# Patient Record
Sex: Female | Born: 1965 | Race: White | Hispanic: No | Smoking: Never smoker
Health system: Southern US, Community
[De-identification: ages and names within clinical notes are randomized; demographics above are authoritative.]

## PROBLEM LIST (undated history)

## (undated) DIAGNOSIS — F32A Depression, unspecified: Secondary | ICD-10-CM

## (undated) DIAGNOSIS — F419 Anxiety disorder, unspecified: Secondary | ICD-10-CM

## (undated) DIAGNOSIS — G2581 Restless legs syndrome: Secondary | ICD-10-CM

## (undated) DIAGNOSIS — K5900 Constipation, unspecified: Secondary | ICD-10-CM

## (undated) DIAGNOSIS — G4730 Sleep apnea, unspecified: Secondary | ICD-10-CM

## (undated) DIAGNOSIS — M1990 Unspecified osteoarthritis, unspecified site: Secondary | ICD-10-CM

## (undated) DIAGNOSIS — E119 Type 2 diabetes mellitus without complications: Secondary | ICD-10-CM

## (undated) DIAGNOSIS — R0602 Shortness of breath: Secondary | ICD-10-CM

## (undated) DIAGNOSIS — Z86718 Personal history of other venous thrombosis and embolism: Secondary | ICD-10-CM

## (undated) DIAGNOSIS — R7303 Prediabetes: Secondary | ICD-10-CM

## (undated) DIAGNOSIS — F429 Obsessive-compulsive disorder, unspecified: Secondary | ICD-10-CM

## (undated) DIAGNOSIS — G629 Polyneuropathy, unspecified: Secondary | ICD-10-CM

## (undated) DIAGNOSIS — T7840XA Allergy, unspecified, initial encounter: Secondary | ICD-10-CM

## (undated) DIAGNOSIS — R5382 Chronic fatigue, unspecified: Secondary | ICD-10-CM

## (undated) DIAGNOSIS — M65312 Trigger thumb, left thumb: Secondary | ICD-10-CM

## (undated) DIAGNOSIS — K0889 Other specified disorders of teeth and supporting structures: Secondary | ICD-10-CM

## (undated) DIAGNOSIS — R1310 Dysphagia, unspecified: Secondary | ICD-10-CM

## (undated) DIAGNOSIS — G9332 Myalgic encephalomyelitis/chronic fatigue syndrome: Secondary | ICD-10-CM

## (undated) DIAGNOSIS — R633 Feeding difficulties: Secondary | ICD-10-CM

## (undated) DIAGNOSIS — R519 Headache, unspecified: Secondary | ICD-10-CM

## (undated) DIAGNOSIS — J9690 Respiratory failure, unspecified, unspecified whether with hypoxia or hypercapnia: Secondary | ICD-10-CM

## (undated) DIAGNOSIS — E039 Hypothyroidism, unspecified: Secondary | ICD-10-CM

## (undated) DIAGNOSIS — R0600 Dyspnea, unspecified: Secondary | ICD-10-CM

## (undated) DIAGNOSIS — N189 Chronic kidney disease, unspecified: Secondary | ICD-10-CM

## (undated) DIAGNOSIS — K219 Gastro-esophageal reflux disease without esophagitis: Secondary | ICD-10-CM

## (undated) DIAGNOSIS — D689 Coagulation defect, unspecified: Secondary | ICD-10-CM

## (undated) DIAGNOSIS — M65311 Trigger thumb, right thumb: Secondary | ICD-10-CM

## (undated) DIAGNOSIS — Z5189 Encounter for other specified aftercare: Secondary | ICD-10-CM

## (undated) DIAGNOSIS — G709 Myoneural disorder, unspecified: Secondary | ICD-10-CM

## (undated) DIAGNOSIS — E7800 Pure hypercholesterolemia, unspecified: Secondary | ICD-10-CM

## (undated) DIAGNOSIS — F319 Bipolar disorder, unspecified: Secondary | ICD-10-CM

## (undated) DIAGNOSIS — M7989 Other specified soft tissue disorders: Secondary | ICD-10-CM

## (undated) DIAGNOSIS — F329 Major depressive disorder, single episode, unspecified: Secondary | ICD-10-CM

## (undated) DIAGNOSIS — M549 Dorsalgia, unspecified: Secondary | ICD-10-CM

## (undated) DIAGNOSIS — F988 Other specified behavioral and emotional disorders with onset usually occurring in childhood and adolescence: Secondary | ICD-10-CM

## (undated) DIAGNOSIS — M2550 Pain in unspecified joint: Secondary | ICD-10-CM

## (undated) DIAGNOSIS — I739 Peripheral vascular disease, unspecified: Secondary | ICD-10-CM

## (undated) DIAGNOSIS — I10 Essential (primary) hypertension: Secondary | ICD-10-CM

## (undated) HISTORY — PX: CHOLECYSTECTOMY: SHX55

## (undated) HISTORY — DX: Sleep apnea, unspecified: G47.30

## (undated) HISTORY — DX: Coagulation defect, unspecified: D68.9

## (undated) HISTORY — DX: Other specified behavioral and emotional disorders with onset usually occurring in childhood and adolescence: F98.8

## (undated) HISTORY — DX: Major depressive disorder, single episode, unspecified: F32.9

## (undated) HISTORY — DX: Obsessive-compulsive disorder, unspecified: F42.9

## (undated) HISTORY — DX: Bipolar disorder, unspecified: F31.9

## (undated) HISTORY — DX: Myalgic encephalomyelitis/chronic fatigue syndrome: G93.32

## (undated) HISTORY — DX: Essential (primary) hypertension: I10

## (undated) HISTORY — DX: Anxiety disorder, unspecified: F41.9

## (undated) HISTORY — DX: Myoneural disorder, unspecified: G70.9

## (undated) HISTORY — PX: SPINE SURGERY: SHX786

## (undated) HISTORY — DX: Other specified disorders of teeth and supporting structures: K08.89

## (undated) HISTORY — DX: Encounter for other specified aftercare: Z51.89

## (undated) HISTORY — DX: Personal history of other venous thrombosis and embolism: Z86.718

## (undated) HISTORY — DX: Prediabetes: R73.03

## (undated) HISTORY — DX: Allergy, unspecified, initial encounter: T78.40XA

## (undated) HISTORY — PX: VSD REPAIR: SHX276

## (undated) HISTORY — DX: Pain in unspecified joint: M25.50

## (undated) HISTORY — DX: Depression, unspecified: F32.A

## (undated) HISTORY — DX: Chronic fatigue, unspecified: R53.82

## (undated) HISTORY — DX: Hypothyroidism, unspecified: E03.9

## (undated) HISTORY — DX: Other specified soft tissue disorders: M79.89

## (undated) HISTORY — PX: BACK SURGERY: SHX140

## (undated) HISTORY — DX: Respiratory failure, unspecified, unspecified whether with hypoxia or hypercapnia: J96.90

## (undated) HISTORY — PX: LUMBAR SPINE SURGERY: SHX701

## (undated) HISTORY — DX: Dorsalgia, unspecified: M54.9

## (undated) HISTORY — DX: Unspecified osteoarthritis, unspecified site: M19.90

## (undated) HISTORY — DX: Shortness of breath: R06.02

---
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
## 2000-06-24 ENCOUNTER — Ambulatory Visit (HOSPITAL_COMMUNITY): Admission: RE | Admit: 2000-06-24 | Discharge: 2000-06-24 | Payer: Self-pay | Admitting: Neurosurgery

## 2000-06-24 ENCOUNTER — Encounter: Payer: Self-pay | Admitting: Neurosurgery

## 2000-07-08 ENCOUNTER — Encounter: Payer: Self-pay | Admitting: Neurosurgery

## 2000-07-08 ENCOUNTER — Ambulatory Visit (HOSPITAL_COMMUNITY): Admission: RE | Admit: 2000-07-08 | Discharge: 2000-07-08 | Payer: Self-pay | Admitting: Neurosurgery

## 2000-08-05 ENCOUNTER — Ambulatory Visit (HOSPITAL_COMMUNITY): Admission: RE | Admit: 2000-08-05 | Discharge: 2000-08-05 | Payer: Self-pay | Admitting: Neurosurgery

## 2000-08-05 ENCOUNTER — Encounter: Payer: Self-pay | Admitting: Neurosurgery

## 2000-11-19 ENCOUNTER — Encounter: Payer: Self-pay | Admitting: Neurosurgery

## 2000-11-21 ENCOUNTER — Inpatient Hospital Stay (HOSPITAL_COMMUNITY): Admission: RE | Admit: 2000-11-21 | Discharge: 2000-11-24 | Payer: Self-pay | Admitting: Neurosurgery

## 2000-11-21 ENCOUNTER — Encounter: Payer: Self-pay | Admitting: Neurosurgery

## 2000-11-21 HISTORY — PX: LUMBAR FUSION: SHX111

## 2000-12-09 ENCOUNTER — Encounter: Payer: Self-pay | Admitting: Neurosurgery

## 2000-12-09 ENCOUNTER — Encounter: Admission: RE | Admit: 2000-12-09 | Discharge: 2000-12-09 | Payer: Self-pay | Admitting: Neurosurgery

## 2002-05-18 ENCOUNTER — Ambulatory Visit (HOSPITAL_COMMUNITY): Admission: RE | Admit: 2002-05-18 | Discharge: 2002-05-18 | Payer: Self-pay | Admitting: Pulmonary Disease

## 2002-09-08 ENCOUNTER — Ambulatory Visit (HOSPITAL_COMMUNITY): Admission: RE | Admit: 2002-09-08 | Discharge: 2002-09-08 | Payer: Self-pay | Admitting: Pulmonary Disease

## 2003-07-18 ENCOUNTER — Ambulatory Visit (HOSPITAL_COMMUNITY): Admission: RE | Admit: 2003-07-18 | Discharge: 2003-07-18 | Payer: Self-pay | Admitting: Urology

## 2003-07-18 ENCOUNTER — Encounter: Payer: Self-pay | Admitting: Urology

## 2004-08-24 ENCOUNTER — Ambulatory Visit: Payer: Self-pay | Admitting: Psychiatry

## 2004-10-05 ENCOUNTER — Ambulatory Visit: Payer: Self-pay | Admitting: Psychiatry

## 2004-12-14 ENCOUNTER — Ambulatory Visit: Payer: Self-pay | Admitting: Psychiatry

## 2005-09-03 ENCOUNTER — Ambulatory Visit: Payer: Self-pay | Admitting: Psychology

## 2005-10-31 ENCOUNTER — Ambulatory Visit: Payer: Self-pay | Admitting: Psychology

## 2005-12-05 ENCOUNTER — Ambulatory Visit (HOSPITAL_COMMUNITY): Admission: RE | Admit: 2005-12-05 | Discharge: 2005-12-05 | Payer: Self-pay | Admitting: Neurosurgery

## 2005-12-13 ENCOUNTER — Ambulatory Visit: Payer: Self-pay | Admitting: Psychiatry

## 2006-01-10 ENCOUNTER — Ambulatory Visit (HOSPITAL_COMMUNITY): Payer: Self-pay | Admitting: Psychiatry

## 2006-02-06 ENCOUNTER — Ambulatory Visit (HOSPITAL_COMMUNITY): Payer: Self-pay | Admitting: Psychology

## 2006-02-21 ENCOUNTER — Ambulatory Visit (HOSPITAL_COMMUNITY): Payer: Self-pay | Admitting: Psychiatry

## 2006-03-10 ENCOUNTER — Ambulatory Visit (HOSPITAL_COMMUNITY): Payer: Self-pay | Admitting: Psychology

## 2006-04-09 ENCOUNTER — Ambulatory Visit (HOSPITAL_COMMUNITY): Payer: Self-pay | Admitting: Psychology

## 2006-05-14 ENCOUNTER — Ambulatory Visit (HOSPITAL_COMMUNITY): Payer: Self-pay | Admitting: Psychology

## 2006-05-16 ENCOUNTER — Ambulatory Visit (HOSPITAL_COMMUNITY): Payer: Self-pay | Admitting: Psychiatry

## 2006-06-16 ENCOUNTER — Ambulatory Visit (HOSPITAL_COMMUNITY): Payer: Self-pay | Admitting: Psychology

## 2006-06-27 ENCOUNTER — Ambulatory Visit (HOSPITAL_COMMUNITY): Payer: Self-pay | Admitting: Psychiatry

## 2006-07-28 ENCOUNTER — Ambulatory Visit (HOSPITAL_COMMUNITY): Payer: Self-pay | Admitting: Psychology

## 2006-10-03 ENCOUNTER — Ambulatory Visit (HOSPITAL_COMMUNITY): Payer: Self-pay | Admitting: Psychology

## 2006-10-31 ENCOUNTER — Ambulatory Visit (HOSPITAL_COMMUNITY): Payer: Self-pay | Admitting: Psychiatry

## 2006-11-10 ENCOUNTER — Ambulatory Visit (HOSPITAL_COMMUNITY): Payer: Self-pay | Admitting: Psychology

## 2006-11-28 ENCOUNTER — Ambulatory Visit (HOSPITAL_COMMUNITY): Payer: Self-pay | Admitting: Psychiatry

## 2006-12-26 ENCOUNTER — Ambulatory Visit (HOSPITAL_COMMUNITY): Payer: Self-pay | Admitting: Psychiatry

## 2007-01-21 ENCOUNTER — Ambulatory Visit (HOSPITAL_COMMUNITY): Payer: Self-pay | Admitting: Psychology

## 2007-02-20 ENCOUNTER — Ambulatory Visit (HOSPITAL_COMMUNITY): Payer: Self-pay | Admitting: Psychiatry

## 2007-02-23 ENCOUNTER — Ambulatory Visit (HOSPITAL_COMMUNITY): Payer: Self-pay | Admitting: Psychology

## 2007-05-15 ENCOUNTER — Ambulatory Visit (HOSPITAL_COMMUNITY): Payer: Self-pay | Admitting: Psychiatry

## 2007-05-26 ENCOUNTER — Ambulatory Visit (HOSPITAL_COMMUNITY): Payer: Self-pay | Admitting: Psychology

## 2007-06-26 ENCOUNTER — Ambulatory Visit (HOSPITAL_COMMUNITY): Payer: Self-pay | Admitting: Psychology

## 2007-07-27 ENCOUNTER — Ambulatory Visit (HOSPITAL_COMMUNITY): Payer: Self-pay | Admitting: Psychology

## 2007-08-07 ENCOUNTER — Ambulatory Visit (HOSPITAL_COMMUNITY): Payer: Self-pay | Admitting: Psychiatry

## 2007-08-25 ENCOUNTER — Ambulatory Visit (HOSPITAL_COMMUNITY): Payer: Self-pay | Admitting: Psychology

## 2007-09-15 ENCOUNTER — Ambulatory Visit (HOSPITAL_COMMUNITY): Payer: Self-pay | Admitting: Psychology

## 2007-09-18 ENCOUNTER — Ambulatory Visit (HOSPITAL_COMMUNITY): Payer: Self-pay | Admitting: Psychiatry

## 2007-10-05 ENCOUNTER — Ambulatory Visit (HOSPITAL_COMMUNITY): Admission: RE | Admit: 2007-10-05 | Discharge: 2007-10-05 | Payer: Self-pay | Admitting: Pulmonary Disease

## 2007-10-07 ENCOUNTER — Ambulatory Visit (HOSPITAL_COMMUNITY): Payer: Self-pay | Admitting: Psychology

## 2007-11-16 ENCOUNTER — Ambulatory Visit (HOSPITAL_COMMUNITY): Payer: Self-pay | Admitting: Psychology

## 2007-12-09 ENCOUNTER — Ambulatory Visit (HOSPITAL_COMMUNITY): Payer: Self-pay | Admitting: Psychology

## 2007-12-11 ENCOUNTER — Ambulatory Visit (HOSPITAL_COMMUNITY): Payer: Self-pay | Admitting: Psychiatry

## 2008-01-18 ENCOUNTER — Ambulatory Visit (HOSPITAL_COMMUNITY): Payer: Self-pay | Admitting: Psychology

## 2008-02-05 ENCOUNTER — Ambulatory Visit (HOSPITAL_COMMUNITY): Payer: Self-pay | Admitting: Psychiatry

## 2008-02-09 ENCOUNTER — Ambulatory Visit (HOSPITAL_COMMUNITY): Payer: Self-pay | Admitting: Psychology

## 2008-03-07 ENCOUNTER — Ambulatory Visit (HOSPITAL_COMMUNITY): Payer: Self-pay | Admitting: Psychology

## 2008-04-04 ENCOUNTER — Ambulatory Visit (HOSPITAL_COMMUNITY): Payer: Self-pay | Admitting: Psychology

## 2008-04-29 ENCOUNTER — Ambulatory Visit (HOSPITAL_COMMUNITY): Payer: Self-pay | Admitting: Psychiatry

## 2008-08-09 ENCOUNTER — Ambulatory Visit (HOSPITAL_COMMUNITY): Payer: Self-pay | Admitting: Psychology

## 2008-09-02 ENCOUNTER — Ambulatory Visit (HOSPITAL_COMMUNITY): Payer: Self-pay | Admitting: Psychiatry

## 2008-09-13 ENCOUNTER — Ambulatory Visit (HOSPITAL_COMMUNITY): Payer: Self-pay | Admitting: Psychology

## 2008-10-12 ENCOUNTER — Ambulatory Visit (HOSPITAL_COMMUNITY): Payer: Self-pay | Admitting: Psychology

## 2008-11-07 ENCOUNTER — Ambulatory Visit (HOSPITAL_COMMUNITY): Payer: Self-pay | Admitting: Psychology

## 2009-02-23 ENCOUNTER — Ambulatory Visit (HOSPITAL_COMMUNITY): Payer: Self-pay | Admitting: Psychology

## 2009-03-03 ENCOUNTER — Ambulatory Visit (HOSPITAL_COMMUNITY): Payer: Self-pay | Admitting: Psychiatry

## 2009-05-26 ENCOUNTER — Ambulatory Visit (HOSPITAL_COMMUNITY): Payer: Self-pay | Admitting: Psychiatry

## 2009-07-21 ENCOUNTER — Ambulatory Visit (HOSPITAL_COMMUNITY): Payer: Self-pay | Admitting: Psychiatry

## 2009-08-03 ENCOUNTER — Ambulatory Visit (HOSPITAL_COMMUNITY): Payer: Self-pay | Admitting: Psychology

## 2009-09-04 ENCOUNTER — Ambulatory Visit (HOSPITAL_COMMUNITY): Payer: Self-pay | Admitting: Psychology

## 2009-10-04 ENCOUNTER — Ambulatory Visit (HOSPITAL_COMMUNITY): Payer: Self-pay | Admitting: Psychology

## 2009-11-06 ENCOUNTER — Ambulatory Visit (HOSPITAL_COMMUNITY): Payer: Self-pay | Admitting: Psychology

## 2009-11-24 ENCOUNTER — Ambulatory Visit (HOSPITAL_COMMUNITY): Payer: Self-pay | Admitting: Psychiatry

## 2010-01-01 ENCOUNTER — Ambulatory Visit (HOSPITAL_COMMUNITY): Payer: Self-pay | Admitting: Psychology

## 2010-01-29 ENCOUNTER — Ambulatory Visit (HOSPITAL_COMMUNITY): Payer: Self-pay | Admitting: Psychology

## 2010-02-16 ENCOUNTER — Ambulatory Visit (HOSPITAL_COMMUNITY): Payer: Self-pay | Admitting: Psychiatry

## 2010-06-08 ENCOUNTER — Ambulatory Visit (HOSPITAL_COMMUNITY): Payer: Self-pay | Admitting: Psychiatry

## 2010-06-19 ENCOUNTER — Ambulatory Visit (HOSPITAL_COMMUNITY): Payer: Self-pay | Admitting: Psychology

## 2010-07-20 ENCOUNTER — Ambulatory Visit (HOSPITAL_COMMUNITY): Payer: Self-pay | Admitting: Psychology

## 2010-08-13 ENCOUNTER — Ambulatory Visit (HOSPITAL_COMMUNITY): Admission: RE | Admit: 2010-08-13 | Discharge: 2010-08-13 | Payer: Self-pay | Admitting: Pulmonary Disease

## 2010-08-20 ENCOUNTER — Ambulatory Visit (HOSPITAL_COMMUNITY): Payer: Self-pay | Admitting: Psychology

## 2010-09-20 ENCOUNTER — Ambulatory Visit (HOSPITAL_COMMUNITY)
Admission: RE | Admit: 2010-09-20 | Discharge: 2010-09-20 | Payer: Self-pay | Source: Home / Self Care | Admitting: Pulmonary Disease

## 2010-10-12 ENCOUNTER — Ambulatory Visit (HOSPITAL_COMMUNITY): Payer: Self-pay | Admitting: Psychiatry

## 2010-11-13 ENCOUNTER — Ambulatory Visit (HOSPITAL_COMMUNITY)
Admission: RE | Admit: 2010-11-13 | Discharge: 2010-11-13 | Payer: Self-pay | Source: Home / Self Care | Attending: Psychology | Admitting: Psychology

## 2011-02-05 ENCOUNTER — Encounter (INDEPENDENT_AMBULATORY_CARE_PROVIDER_SITE_OTHER): Payer: Medicaid Other | Admitting: Psychology

## 2011-02-05 DIAGNOSIS — F4001 Agoraphobia with panic disorder: Secondary | ICD-10-CM

## 2011-02-05 DIAGNOSIS — F21 Schizotypal disorder: Secondary | ICD-10-CM

## 2011-02-15 ENCOUNTER — Encounter (HOSPITAL_COMMUNITY): Payer: Self-pay | Admitting: Psychiatry

## 2011-03-07 ENCOUNTER — Encounter (INDEPENDENT_AMBULATORY_CARE_PROVIDER_SITE_OTHER): Payer: Medicaid Other | Admitting: Psychology

## 2011-03-07 DIAGNOSIS — F314 Bipolar disorder, current episode depressed, severe, without psychotic features: Secondary | ICD-10-CM

## 2011-03-07 DIAGNOSIS — F4001 Agoraphobia with panic disorder: Secondary | ICD-10-CM

## 2011-03-22 NOTE — Discharge Summary (Signed)
[facility]  Patient:    Jane [name], Jane [name]                      MRN: [id number] Adm. Date:  [id number] Disc. Date: [id number] Attending:  Emeterio Reeve                           Discharge Summary  ADMITTING DIAGNOSIS:  Spondylosis L5-S1.  DISCHARGE DIAGNOSIS:  Spondylosis L5-S1.  OPERATIVE PROCEDURES:  L5-S1 laminectomy, diskectomy, posterior lumbar interbody fusion with Ray threaded fusion cage.  COMPLICATIONS:  None.  DISCHARGE STATUS:  Alive and well.  HISTORY OF PRESENT ILLNESS:  This is a 45 year old right-handed white girl whose history and physical is recounted in the chart.  She had a disk done at 5-1 numerous years ago.  Had an increase in pain.  Is now admitted for a lumbar fusion.  PAST MEDICAL HISTORY:  Benign.  PHYSICAL EXAMINATION:  GENERAL:  Unremarkable.  NEUROLOGIC:  Left S1 radiculopathy.  HOSPITAL COURSE:   The patient was admitted after ascertainment of normal laboratory values and underwent a lumbar laminectomy, diskectomy, posterior lumbar interbody fusion, with Ray threaded fusion cage L5-S1.  Postoperatively she has done well.  Kept the PCA for two days.  Had her Foley out the first night.  She is up, eating and voiding normally.  She was nauseated with the morphine PCA, but once that had been stopped, her pain was controlled with oral Percocet.  She is being discharged to the care of her family, with a dry, well-healing incision, intact neurologic exam.  FOLLOW-UP:  Will be in the [facility] office in about 10 days for suture removal. DD:  11/24/00 TD:  11/24/00 Job: [id number]

## 2011-03-22 NOTE — H&P (Signed)
[place]  Patient:    Jane [name], LACHAPELLE                      MRN: [id number] Adm. Date:  11/21/00 Attending:  Payton Doughty, M.D.                         History and Physical  ADMISSION DIAGNOSES: 1. Recurrent herniated disk L5-S1. 2. Spondylosis L5-S1.  HISTORY:  This 45 year old, left-handed white female, who had a disk operation in 1997, a left L5-S1 disk.  She did reasonably well, and her mother passed away in 2022-12-19.  Beginning in March she was pulling on an object and had the onset of back pain.  She experienced discomfort down her left leg.  An MRI showed a small recurrence on the left side.  This was with Dr. Encarnacion Slates at [facility].  She got Neurontin.  She reported to me in August for further evaluation.  She tried epidural steroids and did not make any difference for her.  She had a discography done which was strongly and concordantly positive at L5-S1.  L4-5 was neutral and anatomically intact. She is therefore admitted for a lumbar laminectomy, diskectomy, and posterior lumbar inner body fusion.  PAST MEDICAL HISTORY:  Unremarkable for significant diseases.  CURRENT MEDICATIONS: 1. She uses Neurontin 1500 mg q.d. 2. Zanaflex 2 mg q.i.d. for spasm. 3. Tylenol #3 on a p.r.n. basis.  ALLERGIES:  No known drug allergies.  PAST SURGICAL HISTORY: 1. A bunion on her left foot. 2. Diskectomy at L5-S1 on the left in the past.  FAMILY HISTORY:  Mom is deceased.  Dad is deceased.  They had a stroke and heart disease.  SOCIAL HISTORY:  She does not smoke.  She does not drink.  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  REVIEW OF SYSTEMS:  Remarkable for arm weakness, leg weakness, back pain, arm pain, leg pain, neck pain.  PHYSICAL EXAMINATION:  HEENT:  Within normal limits.  NECK:  She has a reasonable range of motion.  She does get a lot of tightness and stiffness in her neck that accompanies her low back  and leg discomfort.  CHEST:  Clear.  CARDIAC:  A regular rate and rhythm.  ABDOMEN:  Nontender, no hepatosplenomegaly.  EXTREMITIES:  Without clubbing or cyanosis.  Peripheral pulses are good.  GENITOURINARY:  Deferred.  NEUROLOGIC:  She is awake, alert, and oriented.  Her cranial nerves II-XII intact.  Motor examination shows 5/5 strength throughout the upper and lower extremities.  She has a bit of pain with hip flexion on the left side. Sensory deficit in the S1 distribution on the left.  Reflexes 2 at the knees, 2 at the right ankle, absent at the left ankle.  Straight leg raising modestly positive bilaterally.  An MRI shows degenerative change at L4-5 and L5-S1, but L5-S1 is much worse, with a left paracentral disk protrusion.  The discography results have been discussed above.  CLINICAL IMPRESSION:  Lumbar spondylosis, following diskectomy, with a small disk recurrence.  PLAN:  A lumbar laminectomy, diskectomy, posterior lumbar interbody fusion, with a Ray threaded fusion cage.  The risks and benefits of this approach have been discussed with her, and she wishes to proceed. DD:  11/21/00 TD:  11/21/00 Job: [id number]

## 2011-03-22 NOTE — Op Note (Signed)
[place]  Patient:    Jane [name], Jane [name]                      MRN: [id number] Proc. Date: 11/21/00 Adm. Date:  [id number] Attending:  Emeterio Reeve                           Operative Report  PREOPERATIVE DIAGNOSIS:  Spondylosis L5-S1.  POSTOPERATIVE DIAGNOSIS:  Spondylosis L5-S1.  OPERATION: L5-S1 laminectomy, diskectomy, posterior lumbar interbody fusion with Ray threaded fusion cage.  SURGEON:   Payton Doughty, M.D.  SERVICE:  Neurosurgery.  INDICATIONS:  This is a 45 year old right-handed white girl who has a disk in the left side at 5-1, has increased back pain, left lower extremity pain, and positive diskography.  DESCRIPTION OF PROCEDURE:  She was taken to the operating suite, intubated, and placed prone on the operating table.  Following shave, prep, and drape in the usual sterile fashion, skin was infiltrated with 1% lidocaine with 1:400,000 epinephrine.  An old skin incision was reopened and extended superior approximately 4 cm.  The lamina of L5 and S1 were uncovered in the muscle, and intraoperative x-ray confirmed correctness of level.  The pars interarticularis lamina and inferior facet of L5 and the superior facet of S1 were removed bilaterally using a high-speed drill.  The L5 and S1 nerve roots were carefully dissected free.   On the left side, there was found to be a large osteophyte covering the disk space, significantly elevating the left L5 root in the neural foramen. This was removed without difficulty resulting in good decompression of the L5 root.  Having completed this decompression bilaterally, 12 x 21 mm Ray threaded fusion cages were placed. They were extremely tight in the disk space.   Intraoperative x-ray showed good placement of cages.  The wound was irrigated.  Hemostasis assured.  The fascia was reapproximated with 0 Vicryl in interrupted fashion.  Subcutaneous tissues were reapproximated with 0 Vicryl in  interrupted fashion.  Subcuticular tissues were reapproximated with 3-0 Vicryl in interrupted fashion.  The skin was closed with 3-0 nylon in a running lock fashion.  Betadine and Telfa dressing was applied and made inclusive with Op-Site.  The patient returned to the recovery room in good condition.DD:  11/21/00 TD:  11/23/00 Job: [id number]

## 2011-04-12 ENCOUNTER — Encounter (HOSPITAL_COMMUNITY): Payer: Medicaid Other | Admitting: Psychiatry

## 2011-04-17 ENCOUNTER — Encounter (HOSPITAL_COMMUNITY): Payer: Medicaid Other | Admitting: Psychology

## 2011-06-21 ENCOUNTER — Encounter (HOSPITAL_COMMUNITY): Payer: Medicaid Other | Admitting: Psychiatry

## 2011-07-05 ENCOUNTER — Encounter (INDEPENDENT_AMBULATORY_CARE_PROVIDER_SITE_OTHER): Payer: Medicaid Other | Admitting: Psychiatry

## 2011-07-05 DIAGNOSIS — F3289 Other specified depressive episodes: Secondary | ICD-10-CM

## 2011-07-10 ENCOUNTER — Encounter (INDEPENDENT_AMBULATORY_CARE_PROVIDER_SITE_OTHER): Payer: Medicaid Other | Admitting: Psychology

## 2011-07-10 DIAGNOSIS — F314 Bipolar disorder, current episode depressed, severe, without psychotic features: Secondary | ICD-10-CM

## 2011-07-10 DIAGNOSIS — F4001 Agoraphobia with panic disorder: Secondary | ICD-10-CM

## 2011-08-05 ENCOUNTER — Ambulatory Visit: Payer: Medicaid Other | Admitting: Cardiology

## 2011-08-12 ENCOUNTER — Encounter (HOSPITAL_COMMUNITY): Payer: Medicaid Other | Admitting: Psychology

## 2011-10-11 ENCOUNTER — Encounter (HOSPITAL_COMMUNITY): Payer: Self-pay | Admitting: *Deleted

## 2011-10-11 ENCOUNTER — Ambulatory Visit (HOSPITAL_COMMUNITY): Payer: Medicaid Other | Admitting: Psychiatry

## 2011-10-11 ENCOUNTER — Encounter (HOSPITAL_COMMUNITY): Payer: Medicaid Other | Admitting: Psychiatry

## 2011-10-18 ENCOUNTER — Other Ambulatory Visit (HOSPITAL_COMMUNITY): Payer: Self-pay | Admitting: Psychiatry

## 2011-11-08 ENCOUNTER — Ambulatory Visit (INDEPENDENT_AMBULATORY_CARE_PROVIDER_SITE_OTHER): Payer: Medicaid Other | Admitting: Psychiatry

## 2011-11-08 ENCOUNTER — Encounter (HOSPITAL_COMMUNITY): Payer: Self-pay | Admitting: Psychiatry

## 2011-11-08 DIAGNOSIS — F3162 Bipolar disorder, current episode mixed, moderate: Secondary | ICD-10-CM

## 2011-11-08 MED ORDER — CLONAZEPAM 2 MG PO TABS: 2.0000 mg | ORAL_TABLET | Freq: Two times a day (BID) | ORAL | Status: DC
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
## 2011-11-13 ENCOUNTER — Ambulatory Visit (INDEPENDENT_AMBULATORY_CARE_PROVIDER_SITE_OTHER): Payer: Medicaid Other | Admitting: Psychology

## 2011-11-13 ENCOUNTER — Encounter (HOSPITAL_COMMUNITY): Payer: Self-pay | Admitting: Psychology

## 2011-11-13 DIAGNOSIS — F3132 Bipolar disorder, current episode depressed, moderate: Secondary | ICD-10-CM

## 2011-11-13 DIAGNOSIS — F4001 Agoraphobia with panic disorder: Secondary | ICD-10-CM
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
## 2011-11-14 ENCOUNTER — Encounter (HOSPITAL_COMMUNITY): Payer: Self-pay | Admitting: Psychology

## 2011-12-13 ENCOUNTER — Ambulatory Visit (HOSPITAL_COMMUNITY): Payer: Medicaid Other | Admitting: Psychology

## 2012-01-03 ENCOUNTER — Ambulatory Visit (HOSPITAL_COMMUNITY): Payer: Medicaid Other | Admitting: Psychiatry

## 2012-02-14 ENCOUNTER — Ambulatory Visit (INDEPENDENT_AMBULATORY_CARE_PROVIDER_SITE_OTHER): Payer: Medicaid Other | Admitting: Psychiatry

## 2012-02-14 ENCOUNTER — Encounter (HOSPITAL_COMMUNITY): Payer: Self-pay | Admitting: Psychiatry

## 2012-02-14 DIAGNOSIS — F319 Bipolar disorder, unspecified: Secondary | ICD-10-CM
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
## 2012-02-24 ENCOUNTER — Ambulatory Visit (INDEPENDENT_AMBULATORY_CARE_PROVIDER_SITE_OTHER): Payer: Medicaid Other | Admitting: Psychology

## 2012-02-24 DIAGNOSIS — F3132 Bipolar disorder, current episode depressed, moderate: Secondary | ICD-10-CM

## 2012-02-24 DIAGNOSIS — F4001 Agoraphobia with panic disorder: Secondary | ICD-10-CM

## 2012-02-25 ENCOUNTER — Encounter (HOSPITAL_COMMUNITY): Payer: Self-pay | Admitting: Psychology
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
## 2012-03-25 ENCOUNTER — Ambulatory Visit (HOSPITAL_COMMUNITY): Payer: Medicaid Other | Admitting: Psychology

## 2012-06-05 ENCOUNTER — Encounter (HOSPITAL_COMMUNITY): Payer: Self-pay | Admitting: Psychiatry

## 2012-06-05 ENCOUNTER — Ambulatory Visit (INDEPENDENT_AMBULATORY_CARE_PROVIDER_SITE_OTHER): Payer: Medicaid Other | Admitting: Psychiatry

## 2012-06-05 DIAGNOSIS — F319 Bipolar disorder, unspecified: Secondary | ICD-10-CM

## 2012-06-05 MED ORDER — SERTRALINE HCL 100 MG PO TABS: 150.0000 mg | ORAL_TABLET | Freq: Every day | ORAL | Status: DC

## 2012-06-05 MED ORDER — RISPERIDONE 1 MG PO TABS: 1.0000 mg | ORAL_TABLET | Freq: Two times a day (BID) | ORAL | Status: DC

## 2012-06-05 NOTE — Progress Notes (Signed)
Patient ID: Jane [name], female   DOB: [birth year], 46 y.o.   MRN: [id number] Developed thrombophlebitis in L leg and now on coumadin for 1 month.  No pulmonary symptoms.  Thought she was taking Lithium but has been off that for almost three years.  Feeling more depressed with no energy.  No suicidal feeling.

## 2012-06-16 ENCOUNTER — Encounter (HOSPITAL_COMMUNITY): Payer: Self-pay | Admitting: Psychology

## 2012-06-16 ENCOUNTER — Ambulatory Visit (INDEPENDENT_AMBULATORY_CARE_PROVIDER_SITE_OTHER): Payer: 59 | Admitting: Psychology

## 2012-06-16 DIAGNOSIS — F4001 Agoraphobia with panic disorder: Secondary | ICD-10-CM

## 2012-06-16 DIAGNOSIS — F3132 Bipolar disorder, current episode depressed, moderate: Secondary | ICD-10-CM
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
## 2012-07-01 ENCOUNTER — Other Ambulatory Visit: Payer: Self-pay

## 2012-07-01 DIAGNOSIS — M7989 Other specified soft tissue disorders: Secondary | ICD-10-CM

## 2012-07-01 DIAGNOSIS — I803 Phlebitis and thrombophlebitis of lower extremities, unspecified: Secondary | ICD-10-CM

## 2012-07-17 ENCOUNTER — Ambulatory Visit (INDEPENDENT_AMBULATORY_CARE_PROVIDER_SITE_OTHER): Payer: 59 | Admitting: Psychiatry

## 2012-07-17 ENCOUNTER — Encounter (HOSPITAL_COMMUNITY): Payer: Self-pay | Admitting: Psychiatry

## 2012-07-17 DIAGNOSIS — F339 Major depressive disorder, recurrent, unspecified: Secondary | ICD-10-CM

## 2012-07-17 MED ORDER — MIRTAZAPINE 15 MG PO TABS: 15.0000 mg | ORAL_TABLET | Freq: Every day | ORAL | Status: DC

## 2012-07-17 NOTE — Progress Notes (Signed)
Patient ID: Jane [name], female   DOB: [birth year], 46 y.o.   MRN: [id number] Still feeling depressed and unable to sleep.  Worried about 25 yr. Old daughter who has bp disorder and is homeless with 2 kids.  Tearful affect. Low energy.  Ambien not helping.

## 2012-07-24 ENCOUNTER — Ambulatory Visit (INDEPENDENT_AMBULATORY_CARE_PROVIDER_SITE_OTHER): Payer: 59 | Admitting: Psychology

## 2012-07-24 DIAGNOSIS — F3132 Bipolar disorder, current episode depressed, moderate: Secondary | ICD-10-CM

## 2012-07-24 DIAGNOSIS — F4001 Agoraphobia with panic disorder: Secondary | ICD-10-CM

## 2012-07-30 ENCOUNTER — Encounter (HOSPITAL_COMMUNITY): Payer: Self-pay | Admitting: Psychology

## 2012-07-30 NOTE — Progress Notes (Signed)
Patient:  Jane [name]   DOB: April 29, 1966  MR Number: [id number]  Location: [facility]AVIORAL [facility] [address] [place] [address] Dept: [phone]  Start: 11 AM End: 12 PM  Provider/Observer:     Hershal Coria PSYD  Chief Complaint:      Chief Complaint  Patient presents with  . Agitation  . Stress    Reason For Service:     the patient was referred because of issues associated with bipolar disorder and has been displaying manic episodes as well as symptoms of depression and social isolation.  The patient also has significant obsessive-compulsive disorder symptoms and other anxiety symptoms.  The patient has displayed poor coping skills, further exacerbated by her overall poor functioning.  Interventions Strategy:  Cognitive/behavioral psychotherapeutic interventions  Participation Level:   Active  Participation Quality:  Appropriate      Behavioral Observation:  Well Groomed, Alert, and Blunt.   Current Psychosocial Factors: The patient reports that he continues to be major stress/conflict between her and some of the family members. She did, however, try to help her youngest daughter again but he did not turn out well. When she called another daughter of hers to talk about it the other daughter essentially said that she did cut off all contact with her youngest daughter do to these types of situations..  Content of Session:   Review current symptoms and continued to work on therapeutic interventions for maintaining stable mood.  Current Status:   The patient reports that her symptoms of depression have improved recently but that agitation/hypomanic states have been more prevalent.  Patient Progress:   Good  Target Goals:   Target goals include maintaining mood stability, maintaining the ability to live independently, and working on reducing the frequency, intensity, and duration of panic  attacks.  Last Reviewed:   07/30/2012  Goals Addressed Today:    Today we worked on issues related to mood stability.  Impression/Diagnosis:   the patient has a long-standing history of bipolar disorder.  However, psychosocial issues and difficulty coping with a wide range of situations further impair our ability to function effectively in two key per psychiatric symptoms in check.  Diagnosis:    Axis I:  1. Bipolar I disorder, most recent episode (or current) depressed, moderate   2. 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

## 2012-08-14 ENCOUNTER — Ambulatory Visit (HOSPITAL_COMMUNITY): Payer: Self-pay | Admitting: Psychiatry

## 2012-08-17 ENCOUNTER — Encounter: Payer: Self-pay | Admitting: Vascular Surgery

## 2012-08-18 ENCOUNTER — Encounter: Payer: Self-pay | Admitting: Vascular Surgery

## 2012-08-18 ENCOUNTER — Ambulatory Visit (INDEPENDENT_AMBULATORY_CARE_PROVIDER_SITE_OTHER): Payer: Medicaid Other | Admitting: Vascular Surgery

## 2012-08-18 VITALS — BP 133/77 | HR 70 | Resp 16 | Ht 70.0 in | Wt 240.0 lb

## 2012-08-18 DIAGNOSIS — Z86718 Personal history of other venous thrombosis and embolism: Secondary | ICD-10-CM

## 2012-08-18 DIAGNOSIS — I80219 Phlebitis and thrombophlebitis of unspecified iliac vein: Secondary | ICD-10-CM

## 2012-08-18 DIAGNOSIS — M79609 Pain in unspecified limb: Secondary | ICD-10-CM

## 2012-08-18 DIAGNOSIS — M7989 Other specified soft tissue disorders: Secondary | ICD-10-CM

## 2012-08-18 DIAGNOSIS — I82409 Acute embolism and thrombosis of unspecified deep veins of unspecified lower extremity: Secondary | ICD-10-CM

## 2012-08-18 NOTE — Progress Notes (Signed)
Subjective:     Patient ID: Jane [name], female   DOB: 01/15/1966, 46 y.o.   MRN: [id number]  HPI this 46 year old female developed left iliofemoral DVT about 2 months ago. She states that she took a nap and when she awoke her left leg became swollen. She has no history of previous DVT, thrombophlebitis, pulmonary embolus, or other thrombotic problems. She has had chronic edema in the left leg since that time. She does not wear elastic compression stockings. She elevates her leg on a pillow at night. Right leg has no swelling. She states the longer she is on her feet during the day worse the swelling becomes. She has no history of stasis ulcers.  Past Medical History  Diagnosis Date  . DVT (deep venous thrombosis)     History  Substance Use Topics  . Smoking status: Never Smoker   . Smokeless tobacco: Not on file  . Alcohol Use: No    Family History  Problem Relation Age of Onset  . Heart disease Mother   . Hyperlipidemia Mother   . Hypertension Mother   . Diabetes Father   . Heart disease Father   . Hyperlipidemia Father   . Hypertension Father   . Hypertension Sister   . Hypertension Brother   . Hyperlipidemia Brother   . Heart disease Brother     No Known Allergies  Current outpatient prescriptions:lamoTRIgine (LAMICTAL) 200 MG tablet, Take 200 mg by mouth daily.  , Disp: , Rfl: ;  levothyroxine (SYNTHROID, LEVOTHROID) 137 MCG tablet, Take 137 mcg by mouth daily., Disp: , Rfl: ;  risperiDONE (RISPERDAL) 1 MG tablet, Take 1 tablet (1 mg total) by mouth 2 (two) times daily., Disp: 60 tablet, Rfl: 12;  rOPINIRole (REQUIP) 1 MG tablet, Take 1 mg by mouth daily., Disp: , Rfl:  sertraline (ZOLOFT) 100 MG tablet, Take 1.5 tablets (150 mg total) by mouth daily. Take 100 mg by mouth daily., Disp: 45 tablet, Rfl: 12;  topiramate (TOPAMAX) 50 MG tablet, Take 50 mg by mouth daily., Disp: , Rfl: ;  warfarin (COUMADIN) 5 MG tablet, Take 5 mg by mouth daily., Disp: , Rfl: ;  zolpidem  (AMBIEN) 10 MG tablet, Take 10 mg by mouth at bedtime as needed., Disp: , Rfl:  mirtazapine (REMERON) 15 MG tablet, Take 1 tablet (15 mg total) by mouth at bedtime., Disp: 30 tablet, Rfl: 2  BP 133/77  Pulse 70  Resp 16  Ht 5\' 10"  (1.778 m)  Wt 240 lb (108.863 kg)  BMI 34.44 kg/m2  Body mass index is 34.44 kg/(m^2).           Review of Systems denies chest pain, dyspnea on exertion, PND, orthopnea, or hemoptysis, claudication. Does have history of hyper thyroidism and bipolar disorder    Objective:   Physical Exam blood pressure 133/77 heart rate 70 respirations 16 Gen.-alert and oriented x3 in no apparent distress HEENT normal for age Lungs no rhonchi or wheezing Cardiovascular regular rhythm no murmurs carotid pulses 3+ palpable no bruits audible Abdomen soft nontender no palpable masses Musculoskeletal free of  major deformities Skin clear -no rashes Neurologic normal Lower extremities 3+ femoral and dorsalis pedis pulses palpable bilaterally with 2+ edema on the left no edema on the right. No active ulcers noted. No hyperpigmentation noted. No varicosities noted. Left calf is 3/2 cm larger in circumference than right calf.  Today I ordered a venous duplex exam which I reviewed and interpreted. The left superficial femoral popliteal and tibial system appears  patent and compressible. The left common femoral vein and external iliac vein continued to be occluded with chronic DVT.     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-DVT occurred 3 months ago Chronic edema left leg secondary to #1 Patient currently on Coumadin therapy Doubt patient will have complete recannulization at this point so will likely have chronic edema left leg which needs to be treated by gravity using elevation and compression    Plan:     Would recommend 3 more months of Coumadin therapy and then discontinue Elevate legs at night by  elevating foot of the bed or mattress --long-leg elastic compression stockings to be put on first laying in the morning to have maximum benefit 20-30 mm gradient-given prescription Return on when necessary basis-no further suggestions

## 2012-08-18 NOTE — Addendum Note (Signed)
Addended by: Margo Aye A on: 08/18/2012 03:42 PM   Modules accepted: Orders

## 2012-08-18 NOTE — Progress Notes (Signed)
Left lower extremity venous duplex performed @ [facility] 08/18/2012

## 2012-08-28 ENCOUNTER — Encounter (HOSPITAL_COMMUNITY): Payer: Self-pay | Admitting: Psychiatry

## 2012-08-28 ENCOUNTER — Ambulatory Visit (INDEPENDENT_AMBULATORY_CARE_PROVIDER_SITE_OTHER): Payer: 59 | Admitting: Psychiatry

## 2012-08-28 DIAGNOSIS — F310 Bipolar disorder, current episode hypomanic: Secondary | ICD-10-CM

## 2012-08-28 DIAGNOSIS — F3110 Bipolar disorder, current episode manic without psychotic features, unspecified: Secondary | ICD-10-CM

## 2012-08-28 NOTE — Progress Notes (Signed)
Patient ID: Jane [name], female   DOB: Mar 15, 1966, 46 y.o.   MRN: [id number] Reports feeling manic -- cleaning house, up at night, poor sleep, mind racing.  Appropriate today.  No depression lately.  Anxiety attacks and irritable phases with daughter.

## 2012-09-01 ENCOUNTER — Ambulatory Visit (INDEPENDENT_AMBULATORY_CARE_PROVIDER_SITE_OTHER): Payer: 59 | Admitting: Psychology

## 2012-09-01 DIAGNOSIS — F310 Bipolar disorder, current episode hypomanic: Secondary | ICD-10-CM

## 2012-09-01 DIAGNOSIS — F3110 Bipolar disorder, current episode manic without psychotic features, unspecified: Secondary | ICD-10-CM

## 2012-09-02 ENCOUNTER — Encounter (HOSPITAL_COMMUNITY): Payer: Self-pay | Admitting: Psychology
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
## 2012-09-25 ENCOUNTER — Ambulatory Visit (INDEPENDENT_AMBULATORY_CARE_PROVIDER_SITE_OTHER): Payer: 59 | Admitting: Psychiatry

## 2012-09-25 ENCOUNTER — Encounter (HOSPITAL_COMMUNITY): Payer: Self-pay | Admitting: Psychiatry

## 2012-09-25 VITALS — BP 160/88 | HR 76 | Ht 69.5 in | Wt 239.6 lb

## 2012-09-25 DIAGNOSIS — F319 Bipolar disorder, unspecified: Secondary | ICD-10-CM | POA: Insufficient documentation

## 2012-09-25 DIAGNOSIS — F5105 Insomnia due to other mental disorder: Secondary | ICD-10-CM | POA: Insufficient documentation

## 2012-09-25 DIAGNOSIS — F411 Generalized anxiety disorder: Secondary | ICD-10-CM

## 2012-09-25 DIAGNOSIS — F3130 Bipolar disorder, current episode depressed, mild or moderate severity, unspecified: Secondary | ICD-10-CM

## 2012-09-25 MED ORDER — RISPERIDONE 1 MG PO TABS: ORAL_TABLET | ORAL | Status: DC

## 2012-09-25 MED ORDER — SERTRALINE HCL 100 MG PO TABS: 150.0000 mg | ORAL_TABLET | Freq: Every day | ORAL | Status: DC

## 2012-09-25 MED ORDER — LAMOTRIGINE 200 MG PO TABS: 100.0000 mg | ORAL_TABLET | Freq: Two times a day (BID) | ORAL | Status: DC

## 2012-09-25 NOTE — Progress Notes (Signed)
[facility] MD Progress Note  09/25/2012 11:41 AM Jane [name]  MRN:  [id number]  Diagnosis:   Axis I: Anxiety Disorder NOS and Bipolar, Depressed Axis II: Deferred Axis III:  Past Medical History  Diagnosis Date  . DVT (deep venous thrombosis)   . Anxiety   . Bipolar disorder    Axis IV: other psychosocial or environmental problems Axis V: 41-50 serious symptoms  ADL's:  Intact  Sleep: Poor  Appetite:  Poor  Suicidal Ideation:  Pt denies any thoughts, plans, intent of suicide Homicidal Ideation:  Pt denies any thoughts, plans, intent of homicide  AEB (as evidenced by):per pt report  Mental Status Examination/Evaluation: Objective:  Appearance: Casual  Eye Contact::  Good  Speech:  Clear and Coherent  Volume:  Normal  Mood:  Anxious, Depressed, Hopeless, Irritable and Worthless  Affect:  Congruent  Thought Process:  Coherent  Orientation:  Full  Thought Content:  WDL  Suicidal Thoughts:  No  Homicidal Thoughts:  No  Memory:  Immediate;   Poor Recent;   Fair Remote;   Poor  Judgement:  Good  Insight:  Fair  Psychomotor Activity:  Normal  Concentration:  Poor  Recall:  Poor  Akathisia:  No  Handed:  Left  AIMS (if indicated):     Assets:  Communication Skills Desire for Improvement  Sleep:      Vital Signs:Blood pressure 160/88, pulse 76, height 5' 9.5" (1.765 m), weight 239 lb 9.6 oz (108.682 kg). Current Medications: Current Outpatient Prescriptions  Medication Sig Dispense Refill  . lamoTRIgine (LAMICTAL) 200 MG tablet Take 200 mg by mouth daily.        . risperiDONE (RISPERDAL) 1 MG tablet Take 1 tablet (1 mg total) by mouth 2 (two) times daily.  60 tablet  12  . rOPINIRole (REQUIP) 1 MG tablet Take 1 mg by mouth daily.      . sertraline (ZOLOFT) 100 MG tablet Take 1.5 tablets (150 mg total) by mouth daily. Take 100 mg by mouth daily.  45 tablet  12  . topiramate (TOPAMAX) 50 MG tablet Take 50 mg by mouth daily.      Marland Kitchen zolpidem (AMBIEN) 10 MG tablet  Take 10 mg by mouth at bedtime as needed.      Marland Kitchen levothyroxine (SYNTHROID, LEVOTHROID) 137 MCG tablet Take 137 mcg by mouth daily.      . mirtazapine (REMERON) 15 MG tablet Take 1 tablet (15 mg total) by mouth at bedtime.  30 tablet  2  . warfarin (COUMADIN) 5 MG tablet Take 5 mg by mouth daily.        Lab Results: No results found for this or any previous visit (from the past 48 hour(s)).  Physical Findings: AIMS:  , ,  ,  ,    CIWA:    COWS:     Treatment Plan Summary: Medication management Counseling and learning relaxation and meditation techniques.  Plan: Continue Zoloft and Lamioctal, but divide up during the day. Change Risperdal to .5mg  TID and 3 mg at HS Stop Remeron Contine Ambien Practice meditation every day. Get to an alanon mtg. Every week. See orders and pt instructions for more details.  Avangelina Flight 09/25/2012, 11:41 AM
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
## 2012-10-06 ENCOUNTER — Ambulatory Visit (INDEPENDENT_AMBULATORY_CARE_PROVIDER_SITE_OTHER): Payer: 59 | Admitting: Psychology

## 2012-10-06 ENCOUNTER — Encounter (HOSPITAL_COMMUNITY): Payer: Self-pay | Admitting: Psychology

## 2012-10-06 DIAGNOSIS — F429 Obsessive-compulsive disorder, unspecified: Secondary | ICD-10-CM

## 2012-10-06 DIAGNOSIS — F3110 Bipolar disorder, current episode manic without psychotic features, unspecified: Secondary | ICD-10-CM

## 2012-10-06 DIAGNOSIS — F310 Bipolar disorder, current episode hypomanic: Secondary | ICD-10-CM
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
## 2012-11-06 ENCOUNTER — Ambulatory Visit (HOSPITAL_COMMUNITY): Payer: Self-pay | Admitting: Psychiatry

## 2012-11-10 ENCOUNTER — Ambulatory Visit (HOSPITAL_COMMUNITY): Payer: Self-pay | Admitting: Psychology

## 2012-11-20 ENCOUNTER — Encounter (HOSPITAL_COMMUNITY): Payer: Self-pay | Admitting: Psychiatry

## 2012-11-20 ENCOUNTER — Ambulatory Visit (INDEPENDENT_AMBULATORY_CARE_PROVIDER_SITE_OTHER): Payer: 59 | Admitting: Psychiatry

## 2012-11-20 VITALS — Wt 237.6 lb

## 2012-11-20 DIAGNOSIS — F5105 Insomnia due to other mental disorder: Secondary | ICD-10-CM

## 2012-11-20 DIAGNOSIS — F319 Bipolar disorder, unspecified: Secondary | ICD-10-CM

## 2012-11-20 DIAGNOSIS — F3130 Bipolar disorder, current episode depressed, mild or moderate severity, unspecified: Secondary | ICD-10-CM

## 2012-11-20 DIAGNOSIS — F429 Obsessive-compulsive disorder, unspecified: Secondary | ICD-10-CM | POA: Insufficient documentation

## 2012-11-20 MED ORDER — RISPERIDONE 1 MG PO TABS: ORAL_TABLET | ORAL | Status: DC

## 2012-11-20 MED ORDER — SERTRALINE HCL 100 MG PO TABS: ORAL_TABLET | ORAL | Status: DC

## 2012-11-20 MED ORDER — LAMOTRIGINE 200 MG PO TABS: 100.0000 mg | ORAL_TABLET | Freq: Two times a day (BID) | ORAL | Status: DC

## 2012-11-20 NOTE — Progress Notes (Signed)
[facility] [address] Progress Note Jane [name] MRN: [id number] DOB: [birth year] Age: 48 y.o.  Date: 11/20/2012 Start Time: 9:15 AM End Time: 9:46 AM  Chief Complaint: Chief Complaint  Patient presents with  . Depression  . Follow-up  . Medication Refill   Subjective: "My mind just goes and goes on stupid things when I try to sleep.  I am doing about an hour a day of meditation.  I walk the treadmill for 15 minutes three times a day".  Objective:  This sounds more like OCD sleep problems.  Will push the dose of Zoloft up to max if needed. Explained OCD thinking at night and pt agrees that that is what she expereinces.  Explained how to push the dose to 250 mg and if no change at all in 6 weeks, then call for other options.  Medications: Zoloft 100 mg one twice a day Risperdal 1 mg 1/2 three times a day and 4 at HS Lamictal 200 mg 1/2 twice a day Ambien has stopped this.  Diagnosis:   Axis I: Bipolar, Depressed and Obsessive Compulsive Disorder Axis II: Deferred Axis III:  Past Medical History  Diagnosis Date  . DVT (deep venous thrombosis)   . Anxiety   . Bipolar disorder   . Obsessive-compulsive disorder    Axis IV: other psychosocial or environmental problems Axis V: 61-70 mild symptoms  ADL's:  Intact  Sleep: Poor  Appetite:  Good  Suicidal Ideation:  Pt denies any thoughts, plans, intent of suicide Homicidal Ideation:  Pt denies any thoughts, plans, intent of homicide  AEB (as evidenced by):per pt report  Mental Status Examination/Evaluation: Objective:  Appearance: Casual  Eye Contact::  Good  Speech:  Clear and Coherent  Volume:  Normal  Mood:  Anxious, Depressed, Hopeless, Irritable and Worthless  Affect:  Congruent  Thought Process:  Coherent  Orientation:  Full  Thought Content:  WDL  Suicidal Thoughts:  No  Homicidal Thoughts:  No  Memory:  Immediate;   Poor Recent;   Fair Remote;   Poor  Judgement:  Good  Insight:  Fair    Psychomotor Activity:  Normal  Concentration:  Poor  Recall:  Poor  Akathisia:  No  Handed:  Left  AIMS (if indicated):     Assets:  Communication Skills Desire for Improvement  Sleep:      Vital Signs:Weight 237 lb 9.6 oz (107.775 kg).  Lab Results: No results found for this or any previous visit (from the past 48 hour(s)).  Physical Findings: AIMS:  , ,  ,  ,    CIWA:    COWS:     Treatment Plan Summary: Medication management Counseling and learning relaxation and meditation techniques.  Plan/Discussion/Summary: I took her vitals.  I reviewed CC, tobacco/med/surg Hx, meds effects/ side effects, problem list, therapies and responses as well as current situation/symptoms discussed options. See orders and pt instructions for more details.  Orson Aloe, MD, [facility]

## 2012-11-20 NOTE — Patient Instructions (Signed)
Push Zoloft to 250 mg a day.  If no change in the amount or intensity of the nightly review of errors in your day, your week, your month, your life, then call for other options.  Call if problems or concerns.

## 2012-11-23 ENCOUNTER — Ambulatory Visit (INDEPENDENT_AMBULATORY_CARE_PROVIDER_SITE_OTHER): Payer: 59 | Admitting: Psychology

## 2012-11-23 ENCOUNTER — Encounter (HOSPITAL_COMMUNITY): Payer: Self-pay | Admitting: Psychology

## 2012-11-23 DIAGNOSIS — F3110 Bipolar disorder, current episode manic without psychotic features, unspecified: Secondary | ICD-10-CM

## 2012-11-23 DIAGNOSIS — F310 Bipolar disorder, current episode hypomanic: Secondary | ICD-10-CM

## 2012-11-23 DIAGNOSIS — F429 Obsessive-compulsive disorder, unspecified: Secondary | ICD-10-CM
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
## 2012-12-25 ENCOUNTER — Ambulatory Visit (HOSPITAL_COMMUNITY): Payer: Self-pay | Admitting: Psychology

## 2013-01-18 ENCOUNTER — Ambulatory Visit (HOSPITAL_COMMUNITY): Payer: Self-pay | Admitting: Psychiatry

## 2013-01-26 ENCOUNTER — Encounter (HOSPITAL_COMMUNITY): Payer: Self-pay | Admitting: Psychology

## 2013-01-26 ENCOUNTER — Ambulatory Visit (INDEPENDENT_AMBULATORY_CARE_PROVIDER_SITE_OTHER): Payer: 59 | Admitting: Psychology

## 2013-01-26 DIAGNOSIS — F429 Obsessive-compulsive disorder, unspecified: Secondary | ICD-10-CM

## 2013-01-26 DIAGNOSIS — F314 Bipolar disorder, current episode depressed, severe, without psychotic features: Secondary | ICD-10-CM
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
## 2013-01-29 ENCOUNTER — Ambulatory Visit (HOSPITAL_COMMUNITY): Payer: Self-pay | Admitting: Psychiatry

## 2013-02-02 ENCOUNTER — Other Ambulatory Visit (HOSPITAL_COMMUNITY): Payer: Self-pay | Admitting: Psychiatry

## 2013-02-05 NOTE — Telephone Encounter (Signed)
Refill request approved via eScripts.  

## 2013-02-22 ENCOUNTER — Telehealth (HOSPITAL_COMMUNITY): Payer: Self-pay | Admitting: Psychiatry

## 2013-02-22 ENCOUNTER — Encounter (HOSPITAL_COMMUNITY): Payer: Self-pay | Admitting: Psychiatry

## 2013-02-22 DIAGNOSIS — F5105 Insomnia due to other mental disorder: Secondary | ICD-10-CM

## 2013-02-22 DIAGNOSIS — F319 Bipolar disorder, unspecified: Secondary | ICD-10-CM

## 2013-02-22 DIAGNOSIS — F429 Obsessive-compulsive disorder, unspecified: Secondary | ICD-10-CM

## 2013-02-22 MED ORDER — RISPERIDONE 1 MG PO TABS: 1.0000 mg | ORAL_TABLET | Freq: Three times a day (TID) | ORAL | Status: DC

## 2013-02-22 MED ORDER — SERTRALINE HCL 100 MG PO TABS: ORAL_TABLET | ORAL | Status: DC

## 2013-02-22 NOTE — Telephone Encounter (Signed)
Discussed with pt Zoloft that she is taking 100 mg in AM and 400 mg at bed time.  This is way too hight.  She notes a metal taste in her mouth.  She agrees to reduce to 200 mg at bed with the 100 in AM.  She is noting racing thoughts will increase Risperdal to TID.  Script for these sent via eScripts.

## 2013-02-26 ENCOUNTER — Ambulatory Visit (HOSPITAL_COMMUNITY): Payer: Self-pay | Admitting: Psychology

## 2013-03-08 ENCOUNTER — Ambulatory Visit (INDEPENDENT_AMBULATORY_CARE_PROVIDER_SITE_OTHER): Payer: 59 | Admitting: Psychiatry

## 2013-03-08 ENCOUNTER — Encounter (HOSPITAL_COMMUNITY): Payer: Self-pay | Admitting: Psychiatry

## 2013-03-08 VITALS — BP 123/68 | HR 68 | Ht 68.5 in | Wt 240.4 lb

## 2013-03-08 DIAGNOSIS — F3130 Bipolar disorder, current episode depressed, mild or moderate severity, unspecified: Secondary | ICD-10-CM

## 2013-03-08 DIAGNOSIS — F429 Obsessive-compulsive disorder, unspecified: Secondary | ICD-10-CM

## 2013-03-08 DIAGNOSIS — F5105 Insomnia due to other mental disorder: Secondary | ICD-10-CM

## 2013-03-08 DIAGNOSIS — F319 Bipolar disorder, unspecified: Secondary | ICD-10-CM

## 2013-03-08 MED ORDER — LAMOTRIGINE 200 MG PO TABS: 200.0000 mg | ORAL_TABLET | Freq: Two times a day (BID) | ORAL | Status: DC

## 2013-03-08 MED ORDER — CHLORPROMAZINE HCL 25 MG PO TABS: 25.0000 mg | ORAL_TABLET | Freq: Three times a day (TID) | ORAL | Status: DC

## 2013-03-08 MED ORDER — SERTRALINE HCL 100 MG PO TABS: ORAL_TABLET | ORAL | Status: DC

## 2013-03-08 NOTE — Progress Notes (Signed)
[facility] [address] Progress Note Jane [name] MRN: [id number] DOB: 08/25/1966 Age: 47 y.o.  Date: 03/08/2013 Start Time: 2:45 PM End Time: 3:15 PM  Chief Complaint: Chief Complaint  Patient presents with  . Anxiety  . Follow-up  . Medication Refill   Subjective: "My dry mouth and metal taste in my mouth eased up some with the shift off Risperdal some.  I still have it". Depression 8/10 and Anxiety 6/10, where 0 is none and 10 is the worst. Pain is 8/10 primarily involving her back.   Objective:  Pt returns for follow up appointment.  Pt reports that she is compliant with the psychotropic medications with fair benefit and some side effects.  She notes continued dry mouth and metal taste in her mouth.  This got a little better with the reduction on Risperdal.   Discussed with pt the theory of using more Lamictal for mood control and try and use Thorazine for anxiety control.  Medications: Zoloft 100 mg one twice a day Risperdal 1 mg 1/2 three times a day and 4 at HS Lamictal 200 mg 1/2 twice a day Current Outpatient Prescriptions  Medication Sig Dispense Refill  . lamoTRIgine (LAMICTAL) 200 MG tablet Take 0.5 tablets (100 mg total) by mouth 2 (two) times daily.  30 tablet  1  . levothyroxine (SYNTHROID, LEVOTHROID) 137 MCG tablet Take 137 mcg by mouth daily.      . risperiDONE (RISPERDAL) 1 MG tablet Take 1 tablet (1 mg total) by mouth 3 (three) times daily.  42 tablet  0  . rOPINIRole (REQUIP) 1 MG tablet Take 1 mg by mouth daily.      . sertraline (ZOLOFT) 100 MG tablet On the phone we agree to take 1 in AM and 2 at bedtime.  42 tablet  0  . topiramate (TOPAMAX) 50 MG tablet Take 50 mg by mouth daily.      Marland Kitchen warfarin (COUMADIN) 5 MG tablet Take 5 mg by mouth daily.       No current facility-administered medications for this visit.   Allergies: No Known Allergies  Medical History: Past Medical History  Diagnosis Date  . DVT (deep venous thrombosis)   .  Anxiety   . Bipolar disorder   . Obsessive-compulsive disorder    Surgical History: Past Surgical History  Procedure Laterality Date  . Spine surgery    . Cholecystectomy     Diagnosis:   Axis I: Bipolar, Depressed and Obsessive Compulsive Disorder Axis II: Deferred Axis III:  Past Medical History  Diagnosis Date  . DVT (deep venous thrombosis)   . Anxiety   . Bipolar disorder   . Obsessive-compulsive disorder    Axis IV: other psychosocial or environmental problems Axis V: 61-70 mild symptoms  ADL's:  Intact  Sleep: Poor  Appetite:  Good  Suicidal Ideation:  Pt denies any thoughts, plans, intent of suicide Homicidal Ideation:  Pt denies any thoughts, plans, intent of homicide  AEB (as evidenced by):per pt report  Mental Status Examination/Evaluation: Objective:  Appearance: Casual  Eye Contact::  Good  Speech:  Clear and Coherent  Volume:  Normal  Mood:  Anxious, Depressed, Hopeless, Irritable and Worthless  Affect:  Congruent  Thought Process:  Coherent  Orientation:  Full  Thought Content:  WDL  Suicidal Thoughts:  No  Homicidal Thoughts:  No  Memory:  Immediate;   Poor Recent;   Fair Remote;   Poor  Judgement:  Good  Insight:  Fair  Psychomotor Activity:  Normal  Concentration:  Poor  Recall:  Poor  Akathisia:  No  Handed:  Left  AIMS (if indicated):     Assets:  Communication Skills Desire for Improvement  Sleep:      Vitals: BP 123/68  Pulse 68  Ht 5' 8.5" (1.74 m)  Wt 240 lb 6.4 oz (109.045 kg)  BMI 36.02 kg/m2  Lab Results: No results found for this or any previous visit (from the past 8736 hour(s)). PCP draws routine labs and nothing is emerging as of concern.  Physical Findings: AIMS:  , ,  ,  ,    CIWA:    COWS:    Plan/Discussion: I took her vitals.  I reviewed CC, tobacco/med/surg Hx, meds effects/ side effects, problem list, therapies and responses as well as current situation/symptoms discussed options. Shift off Risperdal  because of dry mouth and metal taste in mouth, on to Thorazine for anxiety control and sedation while relying more on Lamictal for mood control and keep Zoloft the same.  See orders and pt instructions for more details.  MEDICATIONS this encounter: No orders of the defined types were placed in this encounter.    Medical Decision Making Problem Points:  Established problem, worsening (2), Review of last therapy session (1) and Review of psycho-social stressors (1) Data Points:  Review or order clinical lab tests (1) Review of medication regiment & side effects (2) Review of new medications or change in dosage (2)  I certify that outpatient services furnished can reasonably be expected to improve the patient's condition.   Orson Aloe, MD, [facility]

## 2013-03-08 NOTE — Patient Instructions (Addendum)
Taper off the Risperdal  Try Lamcital whole tabs twice a day for better mood control (mind racing at night)  Try Thorazine for better anxiety control.  Set a timer for 8 or a certain number minutes and walk for that amount of time in the house or in the yard.  Mark the number of minutes on a calendar for that day.  Do that every day this week.  Then next week increase the time by 1 minutes and then mark the calendar with the number of minutes for that day.  Each week increase your exercise by one minute.  Keep a record of this so you can see the progress you are making.  Do this every day, just like eating and sleeping.  It is good for pain control, depression, and for your soul/spirit.  Bring the record in for your next visit so we can talk about your effort and how you feel with the new exercise program going and working for you.  "I am Wishes Fulfilled Meditation" by Marylene Buerger and Lyndal Pulley may be helpful MUSIC for getting to sleep or for meditating You can order it from on line.  You might find the Chill channel on Pandora and explore the artists that you like better.   Relaxation is the ultimate solution for you.  You can seek it through tub baths, bubble baths, essential oils or incense, walking or chatting with friends, listening to soft music, watching a candle burn and just letting all thoughts go and appreciating the true essence of the Creator.  Pets or animals may be very helpful.  You might spend some time with them and then go do more directed meditation.  Lidoderm, Muscle relaxes, and Antiinflammatories may be better for back pain management.  Even Elavil at night can help with back pain.  Take care of yourself.  No one else is standing up to do the job and only you know what you need.   GET SERIOUS about taking care of yourself.  Do the next right thing and that often means doing something to care for yourself along the lines of are you hungry, are you angry, are you lonely,  are you tired, are you scared?  HALTS is what that stands for.  Call if problems or concerns.

## 2013-03-10 ENCOUNTER — Other Ambulatory Visit (HOSPITAL_COMMUNITY): Payer: Self-pay | Admitting: Pulmonary Disease

## 2013-03-10 DIAGNOSIS — M25562 Pain in left knee: Secondary | ICD-10-CM

## 2013-03-10 DIAGNOSIS — M7989 Other specified soft tissue disorders: Secondary | ICD-10-CM

## 2013-03-12 ENCOUNTER — Ambulatory Visit (HOSPITAL_COMMUNITY): Payer: Medicaid Other

## 2013-03-17 ENCOUNTER — Telehealth (HOSPITAL_COMMUNITY): Payer: Self-pay

## 2013-03-18 MED ORDER — GABAPENTIN 300 MG PO CAPS: 300.0000 mg | ORAL_CAPSULE | Freq: Two times a day (BID) | ORAL | Status: DC

## 2013-03-18 NOTE — Telephone Encounter (Signed)
Reaction to Thorazine.  Will start Neurontin 300 mg bid. Call with concerns.

## 2013-03-22 ENCOUNTER — Ambulatory Visit (HOSPITAL_COMMUNITY): Payer: Self-pay | Admitting: Psychology

## 2013-03-23 ENCOUNTER — Ambulatory Visit (HOSPITAL_COMMUNITY): Payer: 59 | Admitting: Psychology

## 2013-03-25 ENCOUNTER — Ambulatory Visit (HOSPITAL_COMMUNITY): Admission: RE | Admit: 2013-03-25 | Payer: Medicaid Other | Source: Ambulatory Visit

## 2013-04-05 ENCOUNTER — Encounter (HOSPITAL_COMMUNITY): Payer: Self-pay | Admitting: Psychiatry

## 2013-04-05 ENCOUNTER — Ambulatory Visit (INDEPENDENT_AMBULATORY_CARE_PROVIDER_SITE_OTHER): Payer: Medicaid Other | Admitting: Psychiatry

## 2013-04-05 ENCOUNTER — Telehealth (HOSPITAL_COMMUNITY): Payer: Self-pay | Admitting: Psychiatry

## 2013-04-05 VITALS — BP 122/82 | Ht 68.0 in | Wt 235.8 lb

## 2013-04-05 DIAGNOSIS — F3130 Bipolar disorder, current episode depressed, mild or moderate severity, unspecified: Secondary | ICD-10-CM

## 2013-04-05 DIAGNOSIS — F5105 Insomnia due to other mental disorder: Secondary | ICD-10-CM

## 2013-04-05 DIAGNOSIS — F429 Obsessive-compulsive disorder, unspecified: Secondary | ICD-10-CM

## 2013-04-05 DIAGNOSIS — F319 Bipolar disorder, unspecified: Secondary | ICD-10-CM

## 2013-04-05 MED ORDER — LAMOTRIGINE 200 MG PO TABS: 200.0000 mg | ORAL_TABLET | Freq: Two times a day (BID) | ORAL | Status: DC

## 2013-04-05 MED ORDER — QUETIAPINE FUMARATE 50 MG PO TABS: ORAL_TABLET | ORAL | Status: DC

## 2013-04-05 MED ORDER — SERTRALINE HCL 100 MG PO TABS: ORAL_TABLET | ORAL | Status: DC

## 2013-04-05 NOTE — Patient Instructions (Signed)
The Seroquel will cause sedation for the first couple of days, but try to use 1 twice a day and 2 at bedtime.  Take care of yourself.  No one else is standing up to do the job and only you know what you need.   GET SERIOUS about taking care of yourself.  Do the next right thing and that often means doing something to care for yourself along the lines of are you hungry, are you angry, are you lonely, are you tired, are you scared?  HALTS is what that stands for.  Call if problems or concerns.

## 2013-04-05 NOTE — Progress Notes (Signed)
[facility] [address] Progress Note Jane [name] MRN: [id number] DOB: 1965/12/13 Age: 47 y.o.  Date: 04/05/2013 Start Time: 1:45 PM End Time: 2:10 PM  Chief Complaint: Chief Complaint  Patient presents with  . Depression  . Follow-up  . Medication Refill   Subjective: "I still have the dry mouth, but no metal taste.  The Throazine caused me to be more anxious and the Neurontin helped the racing thoughts, but made me have lots of nightmares and talking to the nightmares". Depression 9/10 and Anxiety 9/10, where 0 is none and 10 is the worst. Pain is 9/10 primarily involving her back.   Objective:  Pt returns for follow up appointment.  Pt reports that she is compliant with the psychotropic medications with poor benefit and some side effects.  She notes a blah feeling on the Zoloft 300 mg a day, will reduce to 200 mg.  Consulted with psychopharmacologist and they suggested Seroquel.   Medications: Zoloft 100 mg one in AM and 2 at bedtime Tried Thorazine and Neurontin with considerable side effects Lamictal 200 mg twice a day Current Outpatient Prescriptions  Medication Sig Dispense Refill  . lamoTRIgine (LAMICTAL) 200 MG tablet Take 1 tablet (200 mg total) by mouth 2 (two) times daily.  60 tablet  1  . levothyroxine (SYNTHROID, LEVOTHROID) 137 MCG tablet Take 137 mcg by mouth daily.      . QUEtiapine (SEROQUEL) 50 MG tablet Take by mouth ONE in AM and mid day and TWO at bedtime, may cause sedation the first couple of days  120 tablet  1  . rOPINIRole (REQUIP) 1 MG tablet Take 1 mg by mouth daily.      . sertraline (ZOLOFT) 100 MG tablet Take by mouth 1 in AM and 1 at bedtime.  60 tablet  1  . topiramate (TOPAMAX) 50 MG tablet Take 50 mg by mouth daily.      Marland Kitchen warfarin (COUMADIN) 5 MG tablet Take 5 mg by mouth daily.       No current facility-administered medications for this visit.   Allergies: Allergies  Allergen Reactions  . Abilify (Aripiprazole) Other (See  Comments)    Very violent  . Gabapentin Other (See Comments)    Nightmares and talking to her nightmares  . Thorazine (Chlorpromazine) Other (See Comments)    Shakes, anxiety off the roof and panic attack   Medical History: Past Medical History  Diagnosis Date  . DVT (deep venous thrombosis)   . Anxiety   . Bipolar disorder   . Obsessive-compulsive disorder    Surgical History: Past Surgical History  Procedure Laterality Date  . Spine surgery    . Cholecystectomy     Family History: family history includes ADD / ADHD in her daughter; Anxiety disorder in her daughter; Bipolar disorder in her daughter, maternal aunt, and mother; Diabetes in her father; Drug abuse in her daughters; Heart disease in her brother, father, and mother; Hyperlipidemia in her brother, father, and mother; Hypertension in her brother, father, mother, and sister; and Suicidality in her maternal aunt.  There is no history of Alcohol abuse, and Dementia, and OCD, and Paranoid behavior, and Schizophrenia, and Seizures, and Sexual abuse, and Physical abuse, . Reviewed and nothing new this visit.  Diagnosis:   Axis I: Bipolar, Depressed and Obsessive Compulsive Disorder Axis II: Deferred Axis III:  Past Medical History  Diagnosis Date  . DVT (deep venous thrombosis)   . Anxiety   . Bipolar disorder   . Obsessive-compulsive disorder  Axis IV: other psychosocial or environmental problems Axis V: 61-70 mild symptoms  ADL's:  Intact  Sleep: Poor  Appetite:  Good  Suicidal Ideation:  Pt denies any thoughts, plans, intent of suicide Homicidal Ideation:  Pt denies any thoughts, plans, intent of homicide  AEB (as evidenced by):per pt report  Mental Status Examination/Evaluation: Objective:  Appearance: Casual  Eye Contact::  Good  Speech:  Clear and Coherent  Volume:  Normal  Mood:  Anxious, Depressed, Hopeless, Irritable and Worthless  Affect:  Congruent  Thought Process:  Coherent  Orientation:   Full  Thought Content:  WDL  Suicidal Thoughts:  No  Homicidal Thoughts:  No  Memory:  Immediate;   Poor Recent;   Fair Remote;   Poor  Judgement:  Good  Insight:  Fair  Psychomotor Activity:  Normal  Concentration:  Poor  Recall:  Poor  Akathisia:  No  Handed:  Left  AIMS (if indicated):     Assets:  Communication Skills Desire for Improvement  Sleep:      Vitals: BP 122/82  Ht 5\' 8"  (1.727 m)  Wt 235 lb 12.8 oz (106.958 kg)  BMI 35.86 kg/m2  Lab Results: No results found for this or any previous visit (from the past 8736 hour(s)). PCP draws routine labs and nothing is emerging as of concern.  Physical Findings: AIMS:  , ,  ,  ,    CIWA:    COWS:    Plan/Discussion: I took her vitals.  I reviewed CC, tobacco/med/surg Hx, meds effects/ side effects, problem list, therapies and responses as well as current situation/symptoms discussed options. Try Seroquel for control of mood and help with sedation. See orders and pt instructions for more details.  MEDICATIONS this encounter: Meds ordered this encounter  Medications  . sertraline (ZOLOFT) 100 MG tablet    Sig: Take by mouth 1 in AM and 1 at bedtime.    Dispense:  60 tablet    Refill:  1  . lamoTRIgine (LAMICTAL) 200 MG tablet    Sig: Take 1 tablet (200 mg total) by mouth 2 (two) times daily.    Dispense:  60 tablet    Refill:  1  . QUEtiapine (SEROQUEL) 50 MG tablet    Sig: Take by mouth ONE in AM and mid day and TWO at bedtime, may cause sedation the first couple of days    Dispense:  120 tablet    Refill:  1    Medical Decision Making Problem Points:  Established problem, worsening (2), Review of last therapy session (1) and Review of psycho-social stressors (1) Data Points:  Review or order clinical lab tests (1) Review of medication regiment & side effects (2) Review of new medications or change in dosage (2)  I certify that outpatient services furnished can reasonably be expected to improve the  patient's condition.   Orson Aloe, MD, [facility]

## 2013-04-07 ENCOUNTER — Other Ambulatory Visit (HOSPITAL_COMMUNITY): Payer: Self-pay | Admitting: Pulmonary Disease

## 2013-04-07 ENCOUNTER — Ambulatory Visit (HOSPITAL_COMMUNITY)
Admission: RE | Admit: 2013-04-07 | Discharge: 2013-04-07 | Disposition: A | Discharge status: Home / Self Care | Payer: Medicaid Other | Source: Ambulatory Visit | Attending: Pulmonary Disease | Admitting: Pulmonary Disease

## 2013-04-07 DIAGNOSIS — R52 Pain, unspecified: Secondary | ICD-10-CM

## 2013-04-07 DIAGNOSIS — M7989 Other specified soft tissue disorders: Secondary | ICD-10-CM

## 2013-04-07 DIAGNOSIS — M79609 Pain in unspecified limb: Secondary | ICD-10-CM | POA: Insufficient documentation

## 2013-04-07 DIAGNOSIS — I82402 Acute embolism and thrombosis of unspecified deep veins of left lower extremity: Secondary | ICD-10-CM

## 2013-04-07 DIAGNOSIS — M25562 Pain in left knee: Secondary | ICD-10-CM

## 2013-04-07 DIAGNOSIS — M533 Sacrococcygeal disorders, not elsewhere classified: Secondary | ICD-10-CM | POA: Insufficient documentation

## 2013-04-07 DIAGNOSIS — M549 Dorsalgia, unspecified: Secondary | ICD-10-CM | POA: Insufficient documentation

## 2013-04-07 DIAGNOSIS — Z86718 Personal history of other venous thrombosis and embolism: Secondary | ICD-10-CM

## 2013-04-07 NOTE — Progress Notes (Signed)
*  PRELIMINARY RESULTS* Vascular Ultrasound Left lower extremity venous duplex has been completed.  Preliminary findings: Left = no evidence of DVT. Previous DVT in the common femoral appears to have resolved.   Called report to [facility] at Dr. Juanetta Gosling office.  Farrel Demark, RDMS, RVT  04/07/2013, 11:14 AM

## 2013-04-09 ENCOUNTER — Ambulatory Visit (HOSPITAL_COMMUNITY): Payer: Self-pay | Admitting: Psychology

## 2013-05-03 ENCOUNTER — Ambulatory Visit (HOSPITAL_COMMUNITY): Payer: Self-pay | Admitting: Psychiatry

## 2013-06-17 ENCOUNTER — Ambulatory Visit (INDEPENDENT_AMBULATORY_CARE_PROVIDER_SITE_OTHER): Payer: 59 | Admitting: Psychiatry

## 2013-06-17 ENCOUNTER — Ambulatory Visit (HOSPITAL_COMMUNITY): Payer: Self-pay | Admitting: Psychiatry

## 2013-06-17 ENCOUNTER — Encounter (HOSPITAL_COMMUNITY): Payer: Self-pay | Admitting: Psychiatry

## 2013-06-17 VITALS — BP 140/90 | Ht 70.0 in | Wt 232.0 lb

## 2013-06-17 DIAGNOSIS — F429 Obsessive-compulsive disorder, unspecified: Secondary | ICD-10-CM

## 2013-06-17 DIAGNOSIS — F339 Major depressive disorder, recurrent, unspecified: Secondary | ICD-10-CM | POA: Insufficient documentation

## 2013-06-17 DIAGNOSIS — F319 Bipolar disorder, unspecified: Secondary | ICD-10-CM

## 2013-06-17 DIAGNOSIS — F3130 Bipolar disorder, current episode depressed, mild or moderate severity, unspecified: Secondary | ICD-10-CM

## 2013-06-17 MED ORDER — SERTRALINE HCL 100 MG PO TABS: ORAL_TABLET | ORAL | Status: DC

## 2013-06-17 MED ORDER — LAMOTRIGINE 200 MG PO TABS: 200.0000 mg | ORAL_TABLET | Freq: Two times a day (BID) | ORAL | Status: DC

## 2013-06-17 NOTE — Progress Notes (Signed)
Patient ID: Jane [name], female   DOB: January 25, 1966, 47 y.o.   MRN: [id number] [facility] [address] Progress Note Arizona Sorn MRN: [id number] DOB: Jun 07, 1966 Age: 47 y.o.  Date: 06/17/2013 Start Time: 1:45 PM End Time: 2:10 PM  Chief Complaint: Chief Complaint  Patient presents with  . Depression  . Fatigue  . Medication Refill   Subjective: This patient is a 47 year old divorced white female who lives alone in [place]. She is on disability due to bipolar disorder.  The patient states that she became very depressed when her mother died in 04-02-00 of a brain aneurysm. She started getting. Sad but eventually became manic and develop mood swings and agitated behavior. She's been on numerous medications over the years. In Apr 02, 1988 she also had postpartum depression. For many years she was on lithium but it caused a bad taste in her mouth and her doctor was worried it might be starting to affect her renal function so was discontinued. She also thinks that it stopped working after a number of years.  Recently the patient has not been feeling well at all. She also has hypothyroidism and she is going today to get her labs checked for this. She has no energy and only wants to sleep all the time. Her mood is low and she doesn't seem to care about anything. She's not at all manic or agitated. She ran out of Zoloft a week ago which may have contributed to some of these symptoms. She tried Seroquel but it caused bumps in her mouth as well as vivid hallucinations at nighttime. She since discontinued it. .  .   Medications: Zoloft 100 mg one in AM and 2 at bedtime Tried Thorazine and Neurontin with considerable side effects Lamictal 200 mg twice a day Current Outpatient Prescriptions  Medication Sig Dispense Refill  . lamoTRIgine (LAMICTAL) 200 MG tablet Take 1 tablet (200 mg total) by mouth 2 (two) times daily.  60 tablet  1  . levothyroxine (SYNTHROID, LEVOTHROID) 137 MCG tablet Take 137 mcg by  mouth daily.      Marland Kitchen rOPINIRole (REQUIP) 1 MG tablet Take 1 mg by mouth daily.      . sertraline (ZOLOFT) 100 MG tablet Take by mouth 1 in AM and 1 at bedtime.  60 tablet  1  . topiramate (TOPAMAX) 50 MG tablet Take 50 mg by mouth daily.      Marland Kitchen warfarin (COUMADIN) 5 MG tablet Take 5 mg by mouth daily.       No current facility-administered medications for this visit.   Allergies: Allergies  Allergen Reactions  . Abilify [Aripiprazole] Other (See Comments)    Very violent  . Gabapentin Other (See Comments)    Nightmares and talking to her nightmares  . Thorazine [Chlorpromazine] Other (See Comments)    Shakes, anxiety off the roof and panic attack  . Seroquel [Quetiapine Fumarate]    Medical History: Past Medical History  Diagnosis Date  . DVT (deep venous thrombosis)   . Anxiety   . Bipolar disorder   . Obsessive-compulsive disorder   . Depression   . Thyroid disease    Surgical History: Past Surgical History  Procedure Laterality Date  . Spine surgery    . Cholecystectomy     Family History: family history includes ADD / ADHD in her daughter; Anxiety disorder in her daughter; Bipolar disorder in her daughter, maternal aunt, and mother; Diabetes in her father; Drug abuse in her daughter and daughter; Heart disease in her brother,  father, and mother; Hyperlipidemia in her brother, father, and mother; Hypertension in her brother, father, mother, and sister; Suicidality in her maternal aunt. There is no history of Alcohol abuse, Dementia, OCD, Paranoid behavior, Schizophrenia, Seizures, Sexual abuse, or Physical abuse. Reviewed and nothing new this visit.  Diagnosis:   Axis I: Bipolar, depressed Axis II: Deferred Axis III: Hypothyroidism, history of DVT  Axis IV: other psychosocial or environmental problems Axis V: 60  ADL's:  Intact  Sleep: Poor  Appetite:  Good  Suicidal Ideation:  Pt denies any thoughts, plans, intent of suicide Homicidal Ideation:  Pt denies  any thoughts, plans, intent of homicide  AEB (as evidenced by):per pt report  Mental Status Examination/Evaluation: Objective:  Appearance: Casual  Eye Contact::  Good  Speech:  Clear and Coherent  Volume:  Normal  Mood:  Anxious, Depressed, Hopeless, Irritable and Worthless  Affect:  Congruent  Thought Process:  Coherent  Orientation:  Full  Thought Content:  WDL  Suicidal Thoughts:  No  Homicidal Thoughts:  No  Memory:  Immediate;   Poor Recent;   Fair Remote;   Poor  Judgement:  Good  Insight:  Fair  Psychomotor Activity:  Normal  Concentration:  Poor  Recall:  Poor  Akathisia:  No  Handed:  Left  AIMS (if indicated):     Assets:  Communication Skills Desire for Improvement  Sleep:   too much   Vitals: BP 140/90  Ht 5\' 10"  (1.778 m)  Wt 232 lb (105.235 kg)  BMI 33.29 kg/m2  Lab Results: No results found for this or any previous visit (from the past 8736 hour(s)). She is going to the lab after this visit for thyroid function testing  Physical Findings: AIMS:  , ,  ,  ,    CIWA:    COWS:    Plan/Discussion: I took her vitals.  I reviewed CC, tobacco/med/surg Hx, meds effects/ side effects, problem list, therapies and responses as well as current situation/symptoms discussed options. She'll restart Zoloft at 100 mg twice a day and continue Lamictal 200 mg twice a day. If she's not feeling better in a couple of weeks she will call particularly after her thyroid medication has been readjusted  MEDICATIONS this encounter: Meds ordered this encounter  Medications  . sertraline (ZOLOFT) 100 MG tablet    Sig: Take by mouth 1 in AM and 1 at bedtime.    Dispense:  60 tablet    Refill:  1  . lamoTRIgine (LAMICTAL) 200 MG tablet    Sig: Take 1 tablet (200 mg total) by mouth 2 (two) times daily.    Dispense:  60 tablet    Refill:  1    Medical Decision Making Problem Points:  Established problem, worsening (2), Review of last therapy session (1) and Review of  psycho-social stressors (1) Data Points:  Review or order clinical lab tests (1) Review of medication regiment & side effects (2) Review of new medications or change in dosage (2)  I certify that outpatient services furnished can reasonably be expected to improve the patient's condition.   Diannia Ruder, MD

## 2013-07-14 ENCOUNTER — Ambulatory Visit (HOSPITAL_COMMUNITY): Payer: Self-pay | Admitting: Psychiatry

## 2013-07-15 ENCOUNTER — Encounter (HOSPITAL_COMMUNITY): Payer: Self-pay | Admitting: Psychiatry

## 2013-07-27 ENCOUNTER — Encounter (HOSPITAL_COMMUNITY): Payer: Self-pay | Admitting: Psychiatry

## 2013-07-27 ENCOUNTER — Ambulatory Visit (INDEPENDENT_AMBULATORY_CARE_PROVIDER_SITE_OTHER): Payer: 59 | Admitting: Psychiatry

## 2013-07-27 VITALS — BP 140/100 | Ht 70.0 in | Wt 230.0 lb

## 2013-07-27 DIAGNOSIS — F3130 Bipolar disorder, current episode depressed, mild or moderate severity, unspecified: Secondary | ICD-10-CM

## 2013-07-27 DIAGNOSIS — F339 Major depressive disorder, recurrent, unspecified: Secondary | ICD-10-CM

## 2013-07-27 MED ORDER — DULOXETINE HCL 60 MG PO CPEP: 60.0000 mg | ORAL_CAPSULE | Freq: Every day | ORAL | Status: DC

## 2013-07-27 MED ORDER — ALPRAZOLAM 1 MG PO TABS: 1.0000 mg | ORAL_TABLET | Freq: Two times a day (BID) | ORAL | Status: DC
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
## 2013-07-27 NOTE — Patient Instructions (Signed)
Start Cymbalta 60 mg in the am. Stay on Zoloft 100 mg in the evening, then stop after one week

## 2013-07-29 ENCOUNTER — Ambulatory Visit (INDEPENDENT_AMBULATORY_CARE_PROVIDER_SITE_OTHER): Payer: Medicaid Other | Admitting: Otolaryngology

## 2013-07-29 DIAGNOSIS — K120 Recurrent oral aphthae: Secondary | ICD-10-CM

## 2013-08-02 ENCOUNTER — Ambulatory Visit (INDEPENDENT_AMBULATORY_CARE_PROVIDER_SITE_OTHER): Payer: 59 | Admitting: Psychology

## 2013-08-02 DIAGNOSIS — F3132 Bipolar disorder, current episode depressed, moderate: Secondary | ICD-10-CM

## 2013-08-02 DIAGNOSIS — F429 Obsessive-compulsive disorder, unspecified: Secondary | ICD-10-CM

## 2013-08-02 DIAGNOSIS — F4001 Agoraphobia with panic disorder: Secondary | ICD-10-CM

## 2013-08-12 ENCOUNTER — Other Ambulatory Visit (HOSPITAL_COMMUNITY): Payer: Self-pay | Admitting: Psychiatry

## 2013-08-12 ENCOUNTER — Telehealth (HOSPITAL_COMMUNITY): Payer: Self-pay | Admitting: Psychiatry

## 2013-08-12 MED ORDER — DULOXETINE HCL 60 MG PO CPEP: 60.0000 mg | ORAL_CAPSULE | Freq: Two times a day (BID) | ORAL | Status: DC

## 2013-08-12 NOTE — Telephone Encounter (Signed)
Pt has been taking Cymbalta 60 mg bid by mistake. She feels good on this dose, so will send to pharmacy

## 2013-08-19 ENCOUNTER — Other Ambulatory Visit (HOSPITAL_COMMUNITY): Payer: Self-pay | Admitting: Psychiatry

## 2013-08-24 ENCOUNTER — Ambulatory Visit (INDEPENDENT_AMBULATORY_CARE_PROVIDER_SITE_OTHER): Payer: 59 | Admitting: Psychiatry

## 2013-08-24 ENCOUNTER — Encounter (HOSPITAL_COMMUNITY): Payer: Self-pay | Admitting: Psychiatry

## 2013-08-24 VITALS — BP 150/88 | Ht 70.0 in | Wt 233.0 lb

## 2013-08-24 DIAGNOSIS — F429 Obsessive-compulsive disorder, unspecified: Secondary | ICD-10-CM

## 2013-08-24 DIAGNOSIS — F3130 Bipolar disorder, current episode depressed, mild or moderate severity, unspecified: Secondary | ICD-10-CM

## 2013-08-24 DIAGNOSIS — F339 Major depressive disorder, recurrent, unspecified: Secondary | ICD-10-CM

## 2013-08-24 MED ORDER — SERTRALINE HCL 100 MG PO TABS: ORAL_TABLET | ORAL | Status: DC

## 2013-08-24 NOTE — Progress Notes (Signed)
Patient ID: Jane [name], female   DOB: 06/21/1966, 47 y.o.   MRN: [id number] Patient ID: Jane [name], female   DOB: 10-04-1966, 47 y.o.   MRN: [id number] Patient ID: Jane [name], female   DOB: 1966/09/27, 47 y.o.   MRN: [id number] [facility] [address] Progress Note Jane [name] MRN: [id number] DOB: [birth year] Age: 47 y.o.  Date: 08/24/2013 Start Time: 1:45 PM End Time: 2:10 PM  Chief Complaint: Chief Complaint  Patient presents with  . Anxiety  . Depression  . Follow-up   Subjective: This patient is a 47 year old divorced white female who lives alone in [place]. She is on disability due to bipolar disorder.  The patient states that she became very depressed when her mother died in Mar 29, 2000 of a brain aneurysm. She started getting. Sad but eventually became manic and develop mood swings and agitated behavior. She's been on numerous medications over the years. In 03/29/88 she also had postpartum depression. For many years she was on lithium but it caused a bad taste in her mouth and her doctor was worried it might be starting to affect her renal function so was discontinued. She also thinks that it stopped working after a number of years.  The patient returns after four-week's. Last time we had stopped the Zoloft and only stayed on Cymbalta. Over this past month the patient's been having a lot of nightmares in which she is having conflict with various family members. She ends up yelling out and screaming in her sleep. This is frightened her grandson at times. This phenomenon seems to have happened more since she stopped the Zoloft. Overall her energy is better and she is getting out more. She's not suicidal and her thyroid is under good control now. .  .    Current Outpatient Prescriptions  Medication Sig Dispense Refill  . ALPRAZolam (XANAX) 1 MG tablet Take 1 tablet (1 mg total) by mouth 2 (two) times daily.  60 tablet  2  . ciprofloxacin (CIPRO) 250 MG tablet Take 250 mg by  mouth 2 (two) times daily.      . DULoxetine (CYMBALTA) 60 MG capsule Take 1 capsule (60 mg total) by mouth 2 (two) times daily.  60 capsule  2  . lamoTRIgine (LAMICTAL) 200 MG tablet Take 1 tablet (200 mg total) by mouth 2 (two) times daily.  60 tablet  1  . levothyroxine (SYNTHROID, LEVOTHROID) 150 MCG tablet Take 150 mcg by mouth daily before breakfast.      . rOPINIRole (REQUIP) 1 MG tablet Take 1 mg by mouth daily.      . sertraline (ZOLOFT) 100 MG tablet Take by mouth 1 in AM and 1 at bedtime.  60 tablet  1  . topiramate (TOPAMAX) 50 MG tablet Take 100 mg by mouth daily.       Marland Kitchen warfarin (COUMADIN) 5 MG tablet Take 5 mg by mouth daily.       No current facility-administered medications for this visit.   Allergies: Allergies  Allergen Reactions  . Abilify [Aripiprazole] Other (See Comments)    Very violent  . Gabapentin Other (See Comments)    Nightmares and talking to her nightmares  . Thorazine [Chlorpromazine] Other (See Comments)    Shakes, anxiety off the roof and panic attack  . Seroquel [Quetiapine Fumarate]    Medical History: Past Medical History  Diagnosis Date  . DVT (deep venous thrombosis)   . Anxiety   . Bipolar disorder   . Obsessive-compulsive disorder   . Depression   .  Thyroid disease   . Headache(784.0)   . Hypothyroidism    Surgical History: Past Surgical History  Procedure Laterality Date  . Spine surgery    . Cholecystectomy    . Back surgery     Family History: family history includes ADD / ADHD in her daughter; Anxiety disorder in her daughter; Bipolar disorder in her daughter, maternal aunt, and mother; Diabetes in her father; Drug abuse in her daughter and daughter; Heart disease in her brother, father, and mother; Hyperlipidemia in her brother, father, and mother; Hypertension in her brother, father, mother, and sister; Suicidality in her maternal aunt. There is no history of Alcohol abuse, Dementia, OCD, Paranoid behavior, Schizophrenia,  Seizures, Sexual abuse, or Physical abuse. Reviewed and nothing new this visit.  Diagnosis:   Axis I: Bipolar, depressed Axis II: Deferred Axis III: Hypothyroidism, history of DVT  Axis IV: other psychosocial or environmental problems Axis V: 60  ADL's:  Intact  Sleep: Poor  Appetite:  Good  Suicidal Ideation:  Pt denies any thoughts, plans, intent of suicide Homicidal Ideation:  Pt denies any thoughts, plans, intent of homicide  AEB (as evidenced by):per pt report  Mental Status Examination/Evaluation: Objective:  Appearance: Casual  Eye Contact::  Good  Speech:  Clear and Coherent  Volume:  Normal  Mood:  Anxious, concerned about her sleep issues   Affect:  Congruent  Thought Process:  Coherent  Orientation:  Full  Thought Content:  WDL  Suicidal Thoughts:  No  Homicidal Thoughts:  No  Memory:  Immediate;   Poor Recent;   Fair Remote;   Poor  Judgement:  Good  Insight:  Fair  Psychomotor Activity:  Normal  Concentration:  Poor  Recall:  Poor  Akathisia:  No  Handed:  Left  AIMS (if indicated):     Assets:  Communication Skills Desire for Improvement  Sleep:   too much   Vitals: BP 150/88  Ht 5\' 10"  (1.778 m)  Wt 233 lb (105.688 kg)  BMI 33.43 kg/m2  Lab Results: No results found for this or any previous visit (from the past 8736 hour(s)).   Physical Findings: AIMS:  , ,  ,  ,    CIWA:    COWS:    Plan/Discussion: I took her vitals.  I reviewed CC, tobacco/med/surg Hx, meds effects/ side effects, problem list, therapies and responses as well as current situation/symptoms discussed options. She'llcontinue Lamictal 200 mg twice a day. She will start Cymbalta 60 mg every morning and  add Xanax 1 mg twice a day she'll restart the Zoloft 100 mg twice a day.Marland Kitchen She will return in four-week's  MEDICATIONS this encounter: Meds ordered this encounter  Medications  . sertraline (ZOLOFT) 100 MG tablet    Sig: Take by mouth 1 in AM and 1 at bedtime.     Dispense:  60 tablet    Refill:  1    Medical Decision Making Problem Points:  Established problem, worsening (2), Review of last therapy session (1) and Review of psycho-social stressors (1) Data Points:  Review or order clinical lab tests (1) Review of medication regiment & side effects (2) Review of new medications or change in dosage (2)  I certify that outpatient services furnished can reasonably be expected to improve the patient's condition.   Diannia Ruder, MD

## 2013-08-24 NOTE — Telephone Encounter (Signed)
Pt told to take Zoloft 100 mg only at qhs

## 2013-09-01 ENCOUNTER — Ambulatory Visit (INDEPENDENT_AMBULATORY_CARE_PROVIDER_SITE_OTHER): Payer: 59 | Admitting: Psychology

## 2013-09-01 ENCOUNTER — Encounter (HOSPITAL_COMMUNITY): Payer: Self-pay | Admitting: Psychology

## 2013-09-01 DIAGNOSIS — F3132 Bipolar disorder, current episode depressed, moderate: Secondary | ICD-10-CM
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
## 2013-09-09 ENCOUNTER — Ambulatory Visit (INDEPENDENT_AMBULATORY_CARE_PROVIDER_SITE_OTHER): Payer: Self-pay | Admitting: Otolaryngology

## 2013-09-10 ENCOUNTER — Other Ambulatory Visit (HOSPITAL_COMMUNITY): Payer: Self-pay | Admitting: Pulmonary Disease

## 2013-09-10 DIAGNOSIS — M79605 Pain in left leg: Secondary | ICD-10-CM

## 2013-09-10 DIAGNOSIS — I82402 Acute embolism and thrombosis of unspecified deep veins of left lower extremity: Secondary | ICD-10-CM

## 2013-09-20 ENCOUNTER — Ambulatory Visit (HOSPITAL_COMMUNITY)
Admission: RE | Admit: 2013-09-20 | Discharge: 2013-09-20 | Disposition: A | Discharge status: Home / Self Care | Payer: Medicaid Other | Source: Ambulatory Visit | Attending: Pulmonary Disease | Admitting: Pulmonary Disease

## 2013-09-20 DIAGNOSIS — I82402 Acute embolism and thrombosis of unspecified deep veins of left lower extremity: Secondary | ICD-10-CM

## 2013-09-20 DIAGNOSIS — M79605 Pain in left leg: Secondary | ICD-10-CM

## 2013-09-20 DIAGNOSIS — M79609 Pain in unspecified limb: Secondary | ICD-10-CM | POA: Insufficient documentation

## 2013-09-21 ENCOUNTER — Encounter (HOSPITAL_COMMUNITY): Payer: Self-pay | Admitting: Psychiatry

## 2013-09-21 ENCOUNTER — Ambulatory Visit (INDEPENDENT_AMBULATORY_CARE_PROVIDER_SITE_OTHER): Payer: 59 | Admitting: Psychiatry

## 2013-09-21 VITALS — BP 150/80 | Ht 70.0 in | Wt 234.0 lb

## 2013-09-21 DIAGNOSIS — F3130 Bipolar disorder, current episode depressed, mild or moderate severity, unspecified: Secondary | ICD-10-CM

## 2013-09-21 DIAGNOSIS — F3132 Bipolar disorder, current episode depressed, moderate: Secondary | ICD-10-CM

## 2013-09-21 DIAGNOSIS — F319 Bipolar disorder, unspecified: Secondary | ICD-10-CM

## 2013-09-21 MED ORDER — PRAZOSIN HCL 5 MG PO CAPS: 5.0000 mg | ORAL_CAPSULE | Freq: Every day | ORAL | Status: DC

## 2013-09-21 MED ORDER — LAMOTRIGINE 200 MG PO TABS: 200.0000 mg | ORAL_TABLET | Freq: Two times a day (BID) | ORAL | Status: DC

## 2013-09-21 MED ORDER — ALPRAZOLAM 1 MG PO TABS: 1.0000 mg | ORAL_TABLET | Freq: Two times a day (BID) | ORAL | Status: DC

## 2013-09-21 NOTE — Progress Notes (Signed)
Patient ID: Jane [name], female   DOB: 01/10/1966, 47 y.o.   MRN: [id number] Patient ID: Jane [name], female   DOB: 05/25/1966, 47 y.o.   MRN: [id number] Patient ID: Jane [name], female   DOB: 1966/10/19, 47 y.o.   MRN: [id number] Patient ID: Jane [name], female   DOB: 1966-10-31, 47 y.o.   MRN: [id number] [facility] [address] Progress Note Jane [name] MRN: [id number] DOB: [birth year] Age: 47 y.o.  Date: 09/21/2013 Start Time: 1:45 PM End Time: 2:10 PM  Chief Complaint: Chief Complaint  Patient presents with  . Depression  . Anxiety  . Manic Behavior  . Follow-up   Subjective: This patient is a 47 year old divorced white female who lives alone in [place]. She is on disability due to bipolar disorder.  The patient states that she became very depressed when her mother died in 04-05-00 of a brain aneurysm. She started getting. Sad but eventually became manic and develop mood swings and agitated behavior. She's been on numerous medications over the years. In 04-05-1988 she also had postpartum depression. For many years she was on lithium but it caused a bad taste in her mouth and her doctor was worried it might be starting to affect her renal function so was discontinued. She also thinks that it stopped working after a number of years.  The patient returns after four-week's. She claims she is having more nightmares since she stopped Zoloft. Therefore we reinstated the Zoloft. It is not helping and she continues to have nightmares and cries out in her sleep. She's not talking to 2 of her daughters and numerous people in her family and she seems to harbor a lot of anger. She states that most of the people in her family do not like her boyfriend but her boyfriend is the only person who has been loyal  to her. Her mood is low and she states it often has this time of year. She's not suicidal but just stays tired and depressed. I told her that we could try prazosin to help with the nightmares  and eventually try a different antidepressant. We may as well taper off the Zoloft since it is not helping .  Marland Kitchen    Current Outpatient Prescriptions  Medication Sig Dispense Refill  . ALPRAZolam (XANAX) 1 MG tablet Take 1 tablet (1 mg total) by mouth 2 (two) times daily.  60 tablet  2  . ciprofloxacin (CIPRO) 250 MG tablet Take 250 mg by mouth 2 (two) times daily.      . DULoxetine (CYMBALTA) 60 MG capsule Take 1 capsule (60 mg total) by mouth 2 (two) times daily.  60 capsule  2  . lamoTRIgine (LAMICTAL) 200 MG tablet Take 1 tablet (200 mg total) by mouth 2 (two) times daily.  60 tablet  1  . levothyroxine (SYNTHROID, LEVOTHROID) 150 MCG tablet Take 150 mcg by mouth daily before breakfast.      . prazosin (MINIPRESS) 5 MG capsule Take 1 capsule (5 mg total) by mouth at bedtime.  30 capsule  2  . rOPINIRole (REQUIP) 1 MG tablet Take 1 mg by mouth daily.      . sertraline (ZOLOFT) 100 MG tablet Take by mouth 1 in AM and 1 at bedtime.  60 tablet  1  . topiramate (TOPAMAX) 50 MG tablet Take 100 mg by mouth daily.       Marland Kitchen warfarin (COUMADIN) 5 MG tablet Take 5 mg by mouth daily.       No current facility-administered medications  for this visit.   Allergies: Allergies  Allergen Reactions  . Abilify [Aripiprazole] Other (See Comments)    Very violent  . Gabapentin Other (See Comments)    Nightmares and talking to her nightmares  . Thorazine [Chlorpromazine] Other (See Comments)    Shakes, anxiety off the roof and panic attack  . Seroquel [Quetiapine Fumarate]    Medical History: Past Medical History  Diagnosis Date  . DVT (deep venous thrombosis)   . Anxiety   . Bipolar disorder   . Obsessive-compulsive disorder   . Depression   . Thyroid disease   . Headache(784.0)   . Hypothyroidism    Surgical History: Past Surgical History  Procedure Laterality Date  . Spine surgery    . Cholecystectomy    . Back surgery     Family History: family history includes ADD / ADHD in her  daughter; Anxiety disorder in her daughter; Bipolar disorder in her daughter, maternal aunt, and mother; Diabetes in her father; Drug abuse in her daughter and daughter; Heart disease in her brother, father, and mother; Hyperlipidemia in her brother, father, and mother; Hypertension in her brother, father, mother, and sister; Suicidality in her maternal aunt. There is no history of Alcohol abuse, Dementia, OCD, Paranoid behavior, Schizophrenia, Seizures, Sexual abuse, or Physical abuse. Reviewed and nothing new this visit.  Diagnosis:   Axis I: Bipolar, depressed Axis II: Deferred Axis III: Hypothyroidism, history of DVT  Axis IV: other psychosocial or environmental problems Axis V: 60  ADL's:  Intact  Sleep: Poor  Appetite:  Good  Suicidal Ideation:  Pt denies any thoughts, plans, intent of suicide Homicidal Ideation:  Pt denies any thoughts, plans, intent of homicide  AEB (as evidenced by):per pt report  Mental Status Examination/Evaluation: Objective:  Appearance: Casual  Eye Contact::  Good  Speech:  Clear and Coherent  Volume:  Normal  Mood:  Anxious, irritable   Affect:  Congruent  Thought Process:  Coherent  Orientation:  Full  Thought Content:  WDL  Suicidal Thoughts:  No  Homicidal Thoughts:  No  Memory:  Immediate;   Poor Recent;   Fair Remote;   Poor  Judgement:  Good  Insight:  Fair  Psychomotor Activity:  Normal  Concentration:  Poor  Recall:  Poor  Akathisia:  No  Handed:  Left  AIMS (if indicated):     Assets:  Communication Skills Desire for Improvement  Sleep:   too much   Vitals: BP 150/80  Ht 5\' 10"  (1.778 m)  Wt 234 lb (106.142 kg)  BMI 33.58 kg/m2  Lab Results: No results found for this or any previous visit (from the past 8736 hour(s)).   Physical Findings: AIMS:  , ,  ,  ,    CIWA:    COWS:    Plan/Discussion: I took her vitals.  I reviewed CC, tobacco/med/surg Hx, meds effects/ side effects, problem list, therapies and responses  as well as current situation/symptoms discussed options. She'llcontinue Lamictal 200 mg twice a day, Cymbalta 60 mg every morning and  add Xanax 1 mg twice a day. she'll taper off the Zoloft 100 mg twice a day and start  Prazosin 5 mg each bedtime. She will return in four-week's  MEDICATIONS this encounter: Meds ordered this encounter  Medications  . prazosin (MINIPRESS) 5 MG capsule    Sig: Take 1 capsule (5 mg total) by mouth at bedtime.    Dispense:  30 capsule    Refill:  2  . DISCONTD: ALPRAZolam (  XANAX) 1 MG tablet    Sig: Take 1 tablet (1 mg total) by mouth 2 (two) times daily.    Dispense:  60 tablet    Refill:  2  . lamoTRIgine (LAMICTAL) 200 MG tablet    Sig: Take 1 tablet (200 mg total) by mouth 2 (two) times daily.    Dispense:  60 tablet    Refill:  1  . ALPRAZolam (XANAX) 1 MG tablet    Sig: Take 1 tablet (1 mg total) by mouth 2 (two) times daily.    Dispense:  60 tablet    Refill:  2    Medical Decision Making Problem Points:  Established problem, worsening (2), Review of last therapy session (1) and Review of psycho-social stressors (1) Data Points:  Review or order clinical lab tests (1) Review of medication regiment & side effects (2) Review of new medications or change in dosage (2)  I certify that outpatient services furnished can reasonably be expected to improve the patient's condition.   Diannia Ruder, MD

## 2013-09-29 ENCOUNTER — Ambulatory Visit (HOSPITAL_COMMUNITY): Payer: Self-pay | Admitting: Psychology

## 2013-10-13 ENCOUNTER — Ambulatory Visit (HOSPITAL_COMMUNITY): Payer: Self-pay | Admitting: Psychology

## 2013-10-20 ENCOUNTER — Ambulatory Visit (HOSPITAL_COMMUNITY): Payer: Self-pay | Admitting: Psychiatry

## 2013-10-21 ENCOUNTER — Other Ambulatory Visit (HOSPITAL_COMMUNITY): Payer: Self-pay | Admitting: Psychiatry

## 2013-11-15 ENCOUNTER — Ambulatory Visit (HOSPITAL_COMMUNITY): Payer: Self-pay | Admitting: Psychiatry

## 2013-11-16 ENCOUNTER — Encounter (HOSPITAL_COMMUNITY): Payer: Self-pay | Admitting: Psychology

## 2013-11-16 ENCOUNTER — Ambulatory Visit (INDEPENDENT_AMBULATORY_CARE_PROVIDER_SITE_OTHER): Payer: 59 | Admitting: Psychology

## 2013-11-16 DIAGNOSIS — F3132 Bipolar disorder, current episode depressed, moderate: Secondary | ICD-10-CM

## 2013-11-16 DIAGNOSIS — F429 Obsessive-compulsive disorder, unspecified: Secondary | ICD-10-CM

## 2013-11-16 DIAGNOSIS — F4001 Agoraphobia with panic disorder: Secondary | ICD-10-CM
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
## 2013-11-18 ENCOUNTER — Ambulatory Visit (HOSPITAL_COMMUNITY): Payer: Self-pay | Admitting: Psychiatry

## 2013-11-19 ENCOUNTER — Ambulatory Visit (INDEPENDENT_AMBULATORY_CARE_PROVIDER_SITE_OTHER): Payer: 59 | Admitting: Psychiatry

## 2013-11-19 ENCOUNTER — Encounter (HOSPITAL_COMMUNITY): Payer: Self-pay | Admitting: Psychiatry

## 2013-11-19 VITALS — BP 130/84 | Ht 70.0 in | Wt 233.0 lb

## 2013-11-19 DIAGNOSIS — F3130 Bipolar disorder, current episode depressed, mild or moderate severity, unspecified: Secondary | ICD-10-CM

## 2013-11-19 DIAGNOSIS — F319 Bipolar disorder, unspecified: Secondary | ICD-10-CM

## 2013-11-19 DIAGNOSIS — F3132 Bipolar disorder, current episode depressed, moderate: Secondary | ICD-10-CM

## 2013-11-19 DIAGNOSIS — F429 Obsessive-compulsive disorder, unspecified: Secondary | ICD-10-CM

## 2013-11-19 MED ORDER — LAMOTRIGINE 200 MG PO TABS: 200.0000 mg | ORAL_TABLET | Freq: Two times a day (BID) | ORAL | Status: DC

## 2013-11-19 MED ORDER — SERTRALINE HCL 100 MG PO TABS: ORAL_TABLET | ORAL | Status: DC

## 2013-11-19 MED ORDER — DULOXETINE HCL 60 MG PO CPEP: 60.0000 mg | ORAL_CAPSULE | Freq: Two times a day (BID) | ORAL | Status: DC

## 2013-11-19 MED ORDER — FLURAZEPAM HCL 30 MG PO CAPS: 30.0000 mg | ORAL_CAPSULE | Freq: Every evening | ORAL | Status: DC | PRN

## 2013-11-19 MED ORDER — ALPRAZOLAM 1 MG PO TABS: 1.0000 mg | ORAL_TABLET | Freq: Two times a day (BID) | ORAL | Status: DC

## 2013-11-19 NOTE — Progress Notes (Signed)
Patient ID: Jane [name], female   DOB: Jan 27, 1966, 48 y.o.   MRN: [id number] Patient ID: Jane [name], female   DOB: 1966-04-28, 48 y.o.   MRN: [id number] Patient ID: Jane [name], female   DOB: January 31, 1966, 48 y.o.   MRN: [id number] Patient ID: Jane [name], female   DOB: 12/21/1965, 48 y.o.   MRN: [id number] Patient ID: Jane [name], female   DOB: September 29, 1966, 48 y.o.   MRN: [id number] [facility] [address] Progress Note Jane [name] MRN: [id number] DOB: [birth year] Age: 48 y.o.  Date: 11/19/2013 Start Time: 1:45 PM End Time: 2:10 PM  Chief Complaint: Chief Complaint  Patient presents with  . Anxiety  . Depression  . Follow-up   Subjective: This patient is a 48 year old divorced white female who lives alone in [place]. She is on disability due to bipolar disorder.  The patient states that she became very depressed when her mother died in 02/23/2000 of a brain aneurysm. She started getting. Sad but eventually became manic and develop mood swings and agitated behavior. She's been on numerous medications over the years. In 1988/02/23 she also had postpartum depression. For many years she was on lithium but it caused a bad taste in her mouth and her doctor was worried it might be starting to affect her renal function so was discontinued. She also thinks that it stopped working after a number of years.  The patient returns after 2 months. She's doing about the same. She still gets upset fairly easily particularly with her daughters who do not speak to her and various other family members. We tried Minipress to help her sleep but it made her nightmares worse. She still not sleeping well and having bad nightmares. I told her we could try another sleep medication such as Dalmane. She's quite overweight and probably needs to have assessment for sleep apnea. She still feels that the medicines for mood of help to  calm her down. She's not suicidal and does enjoy being with her boyfriend. .  .     Current Outpatient Prescriptions  Medication Sig Dispense Refill  . amLODipine (NORVASC) 5 MG tablet Take 5 mg by mouth daily.      Marland Kitchen HYDROcodone-acetaminophen (NORCO) 10-325 MG per tablet Take 1 tablet by mouth every 6 (six) hours as needed.      . ALPRAZolam (XANAX) 1 MG tablet Take 1 tablet (1 mg total) by mouth 2 (two) times daily.  60 tablet  2  . ciprofloxacin (CIPRO) 250 MG tablet Take 250 mg by mouth 2 (two) times daily.      . DULoxetine (CYMBALTA) 60 MG capsule Take 1 capsule (60 mg total) by mouth 2 (two) times daily.  60 capsule  2  . flurazepam (DALMANE) 30 MG capsule Take 1 capsule (30 mg total) by mouth at bedtime as needed for sleep.  30 capsule  2  . lamoTRIgine (LAMICTAL) 200 MG tablet Take 1 tablet (200 mg total) by mouth 2 (two) times daily.  60 tablet  2  . levothyroxine (SYNTHROID, LEVOTHROID) 150 MCG tablet Take 150 mcg by mouth daily before breakfast.      . rOPINIRole (REQUIP) 1 MG tablet Take 1 mg by mouth daily.      . sertraline (ZOLOFT) 100 MG tablet Take by mouth 1 in AM and 1 at bedtime.  60 tablet  2  . topiramate (TOPAMAX) 50 MG tablet Take 100 mg by mouth daily.       Marland Kitchen warfarin (COUMADIN) 5 MG tablet Take 5  mg by mouth daily.       No current facility-administered medications for this visit.   Allergies: Allergies  Allergen Reactions  . Abilify [Aripiprazole] Other (See Comments)    Very violent  . Gabapentin Other (See Comments)    Nightmares and talking to her nightmares  . Thorazine [Chlorpromazine] Other (See Comments)    Shakes, anxiety off the roof and panic attack  . Seroquel [Quetiapine Fumarate]    Medical History: Past Medical History  Diagnosis Date  . DVT (deep venous thrombosis)   . Anxiety   . Bipolar disorder   . Obsessive-compulsive disorder   . Depression   . Thyroid disease   . Headache(784.0)   . Hypothyroidism   . Hypertension    Surgical History: Past Surgical History  Procedure Laterality Date  . Spine surgery     . Cholecystectomy    . Back surgery     Family History: family history includes ADD / ADHD in her daughter; Anxiety disorder in her daughter; Bipolar disorder in her daughter, maternal aunt, and mother; Diabetes in her father; Drug abuse in her daughter and daughter; Heart disease in her brother, father, and mother; Hyperlipidemia in her brother, father, and mother; Hypertension in her brother, father, mother, and sister; Suicidality in her maternal aunt. There is no history of Alcohol abuse, Dementia, OCD, Paranoid behavior, Schizophrenia, Seizures, Sexual abuse, or Physical abuse. Reviewed and nothing new this visit.  Diagnosis:   Axis I: Bipolar, depressed Axis II: Deferred Axis III: Hypothyroidism, history of DVT  Axis IV: other psychosocial or environmental problems Axis V: 28  ADL's:  Intact  Sleep: Poor  Appetite:  Good  Suicidal Ideation:  Pt denies any thoughts, plans, intent of suicide Homicidal Ideation:  Pt denies any thoughts, plans, intent of homicide  AEB (as evidenced by):per pt report  Mental Status Examination/Evaluation: Objective:  Appearance: Casual  Eye Contact::  Good  Speech:  Clear and Coherent  Volume:  Normal  Mood:  Anxious, irritable   Affect:  Congruent  Thought Process:  Coherent  Orientation:  Full  Thought Content:  WDL  Suicidal Thoughts:  No  Homicidal Thoughts:  No  Memory:  Immediate;   Poor Recent;   Fair Remote;   Poor  Judgement:  Good  Insight:  Fair  Psychomotor Activity:  Normal  Concentration:  Poor  Recall:  Poor  Akathisia:  No  Handed:  Left  AIMS (if indicated):     Assets:  Communication Skills Desire for Improvement  Sleep:   too much   Vitals: BP 130/84  Ht 5\' 10"  (1.778 m)  Wt 233 lb (105.688 kg)  BMI 33.43 kg/m2  Lab Results: No results found for this or any previous visit (from the past 8736 hour(s)).   Physical Findings: AIMS:  , ,  ,  ,    CIWA:    COWS:    Plan/Discussion: I took her  vitals.  I reviewed CC, tobacco/med/surg Hx, meds effects/ side effects, problem list, therapies and responses as well as current situation/symptoms discussed options. She'llcontinue Lamictal 200 mg twice a day, Cymbalta 60 mg every morning and  add Xanax 1 mg twice a day she is off Zoloft at this point. She will start Dalmane 30 mg each bedtime She will return in 6-week's  MEDICATIONS this encounter: Meds ordered this encounter  Medications  . amLODipine (NORVASC) 5 MG tablet    Sig: Take 5 mg by mouth daily.  Marland Kitchen HYDROcodone-acetaminophen (NORCO) 10-325 MG  per tablet    Sig: Take 1 tablet by mouth every 6 (six) hours as needed.  . sertraline (ZOLOFT) 100 MG tablet    Sig: Take by mouth 1 in AM and 1 at bedtime.    Dispense:  60 tablet    Refill:  2  . lamoTRIgine (LAMICTAL) 200 MG tablet    Sig: Take 1 tablet (200 mg total) by mouth 2 (two) times daily.    Dispense:  60 tablet    Refill:  2  . DULoxetine (CYMBALTA) 60 MG capsule    Sig: Take 1 capsule (60 mg total) by mouth 2 (two) times daily.    Dispense:  60 capsule    Refill:  2  . ALPRAZolam (XANAX) 1 MG tablet    Sig: Take 1 tablet (1 mg total) by mouth 2 (two) times daily.    Dispense:  60 tablet    Refill:  2  . flurazepam (DALMANE) 30 MG capsule    Sig: Take 1 capsule (30 mg total) by mouth at bedtime as needed for sleep.    Dispense:  30 capsule    Refill:  2    Medical Decision Making Problem Points:  Established problem, worsening (2), Review of last therapy session (1) and Review of psycho-social stressors (1) Data Points:  Review or order clinical lab tests (1) Review of medication regiment & side effects (2) Review of new medications or change in dosage (2)  I certify that outpatient services furnished can reasonably be expected to improve the patient's condition.   [name], MD

## 2013-11-22 ENCOUNTER — Ambulatory Visit (HOSPITAL_COMMUNITY): Payer: Self-pay | Admitting: Psychiatry

## 2013-11-23 ENCOUNTER — Telehealth (HOSPITAL_COMMUNITY): Payer: Self-pay | Admitting: Psychiatry

## 2013-11-23 NOTE — Telephone Encounter (Signed)
Med had been discontinued

## 2013-11-25 ENCOUNTER — Ambulatory Visit (INDEPENDENT_AMBULATORY_CARE_PROVIDER_SITE_OTHER): Payer: Self-pay | Admitting: Otolaryngology

## 2013-12-13 ENCOUNTER — Ambulatory Visit (INDEPENDENT_AMBULATORY_CARE_PROVIDER_SITE_OTHER): Payer: 59 | Admitting: Psychology

## 2013-12-13 DIAGNOSIS — F3132 Bipolar disorder, current episode depressed, moderate: Secondary | ICD-10-CM

## 2013-12-13 DIAGNOSIS — F429 Obsessive-compulsive disorder, unspecified: Secondary | ICD-10-CM

## 2013-12-22 ENCOUNTER — Encounter (HOSPITAL_COMMUNITY): Payer: Self-pay | Admitting: Psychology
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
## 2013-12-23 ENCOUNTER — Ambulatory Visit (INDEPENDENT_AMBULATORY_CARE_PROVIDER_SITE_OTHER): Payer: Self-pay | Admitting: Otolaryngology

## 2013-12-31 ENCOUNTER — Ambulatory Visit (HOSPITAL_COMMUNITY): Payer: Self-pay | Admitting: Psychiatry

## 2013-12-31 ENCOUNTER — Telehealth (HOSPITAL_COMMUNITY): Payer: Self-pay | Admitting: *Deleted

## 2013-12-31 NOTE — Telephone Encounter (Signed)
Lots of family stress, not suicidal, will see next week

## 2014-01-04 ENCOUNTER — Ambulatory Visit: Payer: MEDICAID | Admitting: Urology

## 2014-01-04 DIAGNOSIS — N200 Calculus of kidney: Secondary | ICD-10-CM

## 2014-01-04 DIAGNOSIS — R3915 Urgency of urination: Secondary | ICD-10-CM

## 2014-01-07 ENCOUNTER — Encounter (HOSPITAL_COMMUNITY): Payer: Self-pay | Admitting: Psychiatry

## 2014-01-07 ENCOUNTER — Ambulatory Visit (INDEPENDENT_AMBULATORY_CARE_PROVIDER_SITE_OTHER): Payer: 59 | Admitting: Psychiatry

## 2014-01-07 VITALS — BP 130/88 | Ht 70.0 in | Wt 234.0 lb

## 2014-01-07 DIAGNOSIS — F3130 Bipolar disorder, current episode depressed, mild or moderate severity, unspecified: Secondary | ICD-10-CM

## 2014-01-07 DIAGNOSIS — F319 Bipolar disorder, unspecified: Secondary | ICD-10-CM

## 2014-01-07 DIAGNOSIS — F3132 Bipolar disorder, current episode depressed, moderate: Secondary | ICD-10-CM

## 2014-01-07 MED ORDER — DULOXETINE HCL 60 MG PO CPEP: 60.0000 mg | ORAL_CAPSULE | Freq: Two times a day (BID) | ORAL | Status: DC

## 2014-01-07 MED ORDER — LAMOTRIGINE 200 MG PO TABS: 200.0000 mg | ORAL_TABLET | Freq: Two times a day (BID) | ORAL | Status: DC

## 2014-01-07 MED ORDER — ALPRAZOLAM 1 MG PO TABS: 1.0000 mg | ORAL_TABLET | Freq: Two times a day (BID) | ORAL | Status: DC
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
## 2014-01-10 ENCOUNTER — Ambulatory Visit (HOSPITAL_COMMUNITY): Payer: Self-pay | Admitting: Psychology

## 2014-01-18 ENCOUNTER — Telehealth (HOSPITAL_COMMUNITY): Payer: Self-pay | Admitting: *Deleted

## 2014-01-18 ENCOUNTER — Other Ambulatory Visit (HOSPITAL_COMMUNITY): Payer: Self-pay | Admitting: Psychiatry

## 2014-01-18 MED ORDER — TRAZODONE HCL 50 MG PO TABS: 50.0000 mg | ORAL_TABLET | Freq: Every day | ORAL | Status: DC

## 2014-01-18 NOTE — Telephone Encounter (Signed)
Trazodone 50 mg sent to pharmacy

## 2014-01-19 ENCOUNTER — Telehealth (HOSPITAL_COMMUNITY): Payer: Self-pay | Admitting: *Deleted

## 2014-01-31 ENCOUNTER — Telehealth (HOSPITAL_COMMUNITY): Payer: Self-pay | Admitting: *Deleted

## 2014-01-31 ENCOUNTER — Other Ambulatory Visit (HOSPITAL_COMMUNITY): Payer: Self-pay | Admitting: Psychiatry

## 2014-01-31 MED ORDER — MIRTAZAPINE 30 MG PO TABS: 30.0000 mg | ORAL_TABLET | Freq: Every day | ORAL | Status: DC

## 2014-01-31 NOTE — Telephone Encounter (Signed)
Mirtazapine 30 mg qhs sent in

## 2014-02-22 ENCOUNTER — Ambulatory Visit: Payer: Self-pay | Admitting: Urology

## 2014-03-07 ENCOUNTER — Ambulatory Visit (INDEPENDENT_AMBULATORY_CARE_PROVIDER_SITE_OTHER): Payer: 59 | Admitting: Psychology

## 2014-03-07 ENCOUNTER — Encounter (HOSPITAL_COMMUNITY): Payer: Self-pay | Admitting: Psychology

## 2014-03-07 DIAGNOSIS — F3110 Bipolar disorder, current episode manic without psychotic features, unspecified: Secondary | ICD-10-CM

## 2014-03-07 DIAGNOSIS — F310 Bipolar disorder, current episode hypomanic: Secondary | ICD-10-CM
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
## 2014-03-08 ENCOUNTER — Ambulatory Visit (INDEPENDENT_AMBULATORY_CARE_PROVIDER_SITE_OTHER): Payer: 59 | Admitting: Psychiatry

## 2014-03-08 ENCOUNTER — Encounter (HOSPITAL_COMMUNITY): Payer: Self-pay | Admitting: Psychiatry

## 2014-03-08 VITALS — BP 140/78 | Ht 70.0 in | Wt 220.0 lb

## 2014-03-08 DIAGNOSIS — F3130 Bipolar disorder, current episode depressed, mild or moderate severity, unspecified: Secondary | ICD-10-CM

## 2014-03-08 DIAGNOSIS — F310 Bipolar disorder, current episode hypomanic: Secondary | ICD-10-CM

## 2014-03-08 MED ORDER — PRAZOSIN HCL 5 MG PO CAPS: 5.0000 mg | ORAL_CAPSULE | Freq: Every day | ORAL | Status: DC

## 2014-03-08 MED ORDER — DIVALPROEX SODIUM ER 250 MG PO TB24: ORAL_TABLET | ORAL | Status: DC

## 2014-03-08 NOTE — Patient Instructions (Signed)
Get Depakote level after being on 3 pills at night for 5 days

## 2014-03-08 NOTE — Progress Notes (Signed)
Patient ID: Jane [name], female   DOB: [birth year], 48 y.o.   MRN: [id number] Patient ID: Jane [name], female   DOB: 22-May-1966, 48 y.o.   MRN: [id number] Patient ID: Jane [name], female   DOB: 1966/09/10, 48 y.o.   MRN: [id number] Patient ID: Jane [name], female   DOB: April 21, 1966, 48 y.o.   MRN: [id number] Patient ID: Jane [name], female   DOB: 1965-11-25, 48 y.o.   MRN: [id number] Patient ID: Jane [name], female   DOB: 1966-04-26, 48 y.o.   MRN: [id number] Patient ID: Jane [name], female   DOB: 03-Jul-1966, 48 y.o.   MRN: [id number] [facility] [address] Progress Note Jane [name] MRN: [id number] DOB: 1966-10-16 Age: 47 y.o.  Date: 03/08/2014 Start Time: 1:45 PM End Time: 2:10 PM  Chief Complaint: Chief Complaint  Patient presents with  . Anxiety  . Depression  . Manic Behavior   Subjective: This patient is a 48 year-old divorced white female who lives alone in [place]. She is on disability due to bipolar disorder.  The patient states that she became very depressed when her mother died in 2000/02/14 of a brain aneurysm. She started getting. Sad but eventually became manic and develop mood swings and agitated behavior. She's been on numerous medications over the years. In Feb 14, 1988 she also had postpartum depression. For many years she was on lithium but it caused a bad taste in her mouth and her doctor was worried it might be starting to affect her renal function so was discontinued. She also thinks that it stopped working after a number of years.  The patient returns after 2 months. She states that last week she went into a "manic episode. It lasted about 4 days and she didn't sleep hardly at all. She compulsively clean and we cleaned her house. Her thoughts are racing. Now she is getting more depressed and quiet. Obviously this has happened many times before and it looks to me that her Lamictal is not effective at controlling bipolar disorder. She doesn't want to retry lithium  but she has never been on Depakote or Tegretol I think Depakote would be worth a try. She denies suicidal ideation or any psychotic symptoms. Trazodone Remeron and Restoril have been tried for sleep and nothing has helped. She's having severe nightmares and therefore we may need to try a Minipress .  Marland Kitchen    Current Outpatient Prescriptions  Medication Sig Dispense Refill  . ALPRAZolam (XANAX) 1 MG tablet Take 1 tablet (1 mg total) by mouth 2 (two) times daily.  60 tablet  2  . ciprofloxacin (CIPRO) 250 MG tablet Take 250 mg by mouth 2 (two) times daily.      . divalproex (DEPAKOTE ER) 250 MG 24 hr tablet Take one at bedtime for 5 days, then 2 at bedtime for five days, then three at bedtime  90 tablet  2  . DULoxetine (CYMBALTA) 60 MG capsule Take 1 capsule (60 mg total) by mouth 2 (two) times daily.  60 capsule  2  . HYDROcodone-acetaminophen (NORCO) 10-325 MG per tablet Take 1 tablet by mouth every 6 ([name]) hours as needed.      . lamoTRIgine (LAMICTAL) 200 MG tablet Take 1 tablet (200 mg total) by mouth 2 (two) times daily.  60 tablet  2  . levothyroxine (SYNTHROID, LEVOTHROID) 150 MCG tablet Take 150 mcg by mouth daily before breakfast.      . prazosin (MINIPRESS) 5 MG capsule Take 1 capsule (5 mg total) by mouth at bedtime.  [place]  capsule  2  . rOPINIRole (REQUIP) 1 MG tablet Take 1 mg by mouth daily.      Marland Kitchen topiramate (TOPAMAX) 50 MG tablet Take 100 mg by mouth daily.       Marland Kitchen warfarin (COUMADIN) 5 MG tablet Take 5 mg by mouth daily.       No current facility-administered medications for this visit.   Allergies: Allergies  Allergen Reactions  . Abilify [Aripiprazole] Other (See Comments)    Very violent  . Gabapentin Other (See Comments)    Nightmares and talking to her nightmares  . Thorazine [Chlorpromazine] Other (See Comments)    Shakes, anxiety off the roof and panic attack  . Seroquel [Quetiapine Fumarate]    Medical History: Past Medical History  Diagnosis Date  . DVT (deep  venous thrombosis)   . Anxiety   . Bipolar disorder   . Obsessive-compulsive disorder   . Depression   . Thyroid disease   . Headache(784.0)   . Hypothyroidism   . Hypertension    Surgical History: Past Surgical History  Procedure Laterality Date  . Spine surgery    . Cholecystectomy    . Back surgery     Family History: family history includes ADD / ADHD in her daughter; Anxiety disorder in her daughter; Bipolar disorder in her daughter, maternal aunt, and mother; Diabetes in her father; Drug abuse in her daughter and daughter; Heart disease in her brother, father, and mother; Hyperlipidemia in her brother, father, and mother; Hypertension in her brother, father, mother, and sister; Suicidality in her maternal aunt. There is no history of Alcohol abuse, Dementia, OCD, Paranoid behavior, Schizophrenia, Seizures, Sexual abuse, or Physical abuse. Reviewed and nothing new this visit.  Diagnosis:   Axis I: Bipolar, depressed Axis II: Deferred Axis III: Hypothyroidism, history of DVT  Axis IV: other psychosocial or environmental problems Axis V: 93  ADL's:  Intact  Sleep: Poor  Appetite:  Good  Suicidal Ideation:  Pt denies any thoughts, plans, intent of suicide Homicidal Ideation:  Pt denies any thoughts, plans, intent of homicide  AEB (as evidenced by):per pt report  Mental Status Examination/Evaluation: Objective:  Appearance: Casual  Eye Contact::  Good  Speech:  Clear and Coherent  Volume:  Normal  Mood:  Anxious, somewhat dysphoric   Affect:  Congruent  Thought Process:  Coherent  Orientation:  Full  Thought Content:  WDL  Suicidal Thoughts:  No  Homicidal Thoughts:  No  Memory:  Immediate;   Poor Recent;   Fair Remote;   Poor  Judgement:  Good  Insight:  Fair  Psychomotor Activity:  Normal  Concentration:  Poor  Recall:  Poor  Akathisia:  No  Handed:  Left  AIMS (if indicated):     Assets:  Communication Skills Desire for Improvement  Sleep:   too  much   Vitals: BP 140/78  Ht 5\' 10"  (1.778 m)  Wt 220 lb (99.791 kg)  BMI 31.57 kg/m2  Lab Results: No results found for this or any previous visit (from the past 8736 hour(s)).   Physical Findings: AIMS:  , ,  ,  ,    CIWA:    COWS:    Plan/Discussion: I took her vitals.  I reviewed CC, tobacco/med/surg Hx, meds effects/ side effects, problem list, therapies and responses as well as current situation/symptoms discussed options. She'llcontinue  Cymbalta 60 mg every morning and Xanax 1 mg twice a day she'll start Depakote ER 250 mg each bedtime and gradually work up  to 750 mg each bedtime over 2 weeks. She'll then check a level. She'll also gradually get off Lamictal over the same timeframe. Minipress 5 mg will be started to help with nightmares. She will return in four-weeks  MEDICATIONS this encounter: Meds ordered this encounter  Medications  . prazosin (MINIPRESS) 5 MG capsule    Sig: Take 1 capsule (5 mg total) by mouth at bedtime.    Dispense:  30 capsule    Refill:  2  . divalproex (DEPAKOTE ER) 250 MG 24 hr tablet    Sig: Take one at bedtime for 5 days, then 2 at bedtime for five days, then three at bedtime    Dispense:  90 tablet    Refill:  2    Medical Decision Making Problem Points:  Established problem, worsening (2), Review of last therapy session (1) and Review of psycho-social stressors (1) Data Points:  Review or order clinical lab tests (1) Review of medication regiment & side effects (2) Review of new medications or change in dosage (2)  I certify that outpatient services furnished can reasonably be expected to improve the patient's condition.   [name], MD

## 2014-03-23 ENCOUNTER — Ambulatory Visit (HOSPITAL_COMMUNITY)
Admission: RE | Admit: 2014-03-23 | Discharge: 2014-03-23 | Disposition: A | Discharge status: Home / Self Care | Payer: Medicaid Other | Source: Ambulatory Visit | Attending: Pulmonary Disease | Admitting: Pulmonary Disease

## 2014-03-23 ENCOUNTER — Other Ambulatory Visit (HOSPITAL_COMMUNITY): Payer: Self-pay | Admitting: Pulmonary Disease

## 2014-03-23 DIAGNOSIS — M47817 Spondylosis without myelopathy or radiculopathy, lumbosacral region: Secondary | ICD-10-CM | POA: Insufficient documentation

## 2014-03-23 DIAGNOSIS — M545 Low back pain, unspecified: Secondary | ICD-10-CM

## 2014-03-23 DIAGNOSIS — M5137 Other intervertebral disc degeneration, lumbosacral region: Secondary | ICD-10-CM | POA: Insufficient documentation

## 2014-03-23 DIAGNOSIS — M51379 Other intervertebral disc degeneration, lumbosacral region without mention of lumbar back pain or lower extremity pain: Secondary | ICD-10-CM | POA: Insufficient documentation

## 2014-03-24 ENCOUNTER — Ambulatory Visit (HOSPITAL_COMMUNITY): Payer: Self-pay | Admitting: Psychology

## 2014-03-24 LAB — VALPROIC ACID LEVEL: Valproic Acid Lvl: 61.2 ug/mL (ref 50.0–100.0)

## 2014-04-05 ENCOUNTER — Encounter (HOSPITAL_COMMUNITY): Payer: Self-pay | Admitting: Psychiatry

## 2014-04-05 ENCOUNTER — Ambulatory Visit (INDEPENDENT_AMBULATORY_CARE_PROVIDER_SITE_OTHER): Payer: 59 | Admitting: Psychiatry

## 2014-04-05 VITALS — BP 120/80 | Ht 70.0 in | Wt 216.0 lb

## 2014-04-05 DIAGNOSIS — F3130 Bipolar disorder, current episode depressed, mild or moderate severity, unspecified: Secondary | ICD-10-CM

## 2014-04-05 DIAGNOSIS — F3132 Bipolar disorder, current episode depressed, moderate: Secondary | ICD-10-CM

## 2014-04-05 MED ORDER — ALPRAZOLAM 1 MG PO TABS: 1.0000 mg | ORAL_TABLET | Freq: Two times a day (BID) | ORAL | Status: DC

## 2014-04-05 MED ORDER — PRAZOSIN HCL 5 MG PO CAPS: 5.0000 mg | ORAL_CAPSULE | Freq: Every day | ORAL | Status: DC

## 2014-04-05 MED ORDER — DULOXETINE HCL 60 MG PO CPEP: 60.0000 mg | ORAL_CAPSULE | Freq: Two times a day (BID) | ORAL | Status: DC

## 2014-04-05 MED ORDER — CARBAMAZEPINE 200 MG PO TABS: ORAL_TABLET | ORAL | Status: DC

## 2014-04-05 NOTE — Progress Notes (Signed)
Patient ID: Missouri Lapaglia, female   DOB: Jan 25, 1966, 48 y.o.   MRN: [id number] Patient ID: Alcie Runions, female   DOB: September 13, 1966, 48 y.o.   MRN: [id number] Patient ID: Kloie Whiting, female   DOB: 1966/04/03, 48 y.o.   MRN: [id number] Patient ID: Karna Abed, female   DOB: 1966-01-23, 48 y.o.   MRN: [id number] Patient ID: Keairra Bardon, female   DOB: 08/19/1966, 48 y.o.   MRN: [id number] Patient ID: Mkayla Steele, female   DOB: [birth year], 48 y.o.   MRN: [id number] Patient ID: Tiffancy Moger, female   DOB: Jul 06, 1966, 48 y.o.   MRN: [id number] Patient ID: Lakeena Downie, female   DOB: 10-02-1966, 48 y.o.   MRN: [id number] [facility] [address] Progress Note Sharon Rubis MRN: [id number] DOB: October 17, 1966 Age: 48 y.o.  Date: 04/05/2014 Start Time: 1:45 PM End Time: 2:10 PM  Chief Complaint: Chief Complaint  Patient presents with  . Depression  . Manic Behavior  . Follow-up   Subjective: This patient is a 48 year-old divorced white female who lives alone in [place]. She is on disability due to bipolar disorder.  The patient states that she became very depressed when her mother died in 18-Feb-2000 of a brain aneurysm. She started getting. Sad but eventually became manic and develop mood swings and agitated behavior. She's been on numerous medications over the years. In 1988-02-18 she also had postpartum depression. For many years she was on lithium but it caused a bad taste in her mouth and her doctor was worried it might be starting to affect her renal function so was discontinued. She also thinks that it stopped working after a number of years.  The patient returns after one month. She is not able to tolerate the Depakote it made her very drowsy and she stopped it. The Minipress is helping quell her nightmares. However she would like to try another mood stabilizer. At the moment she is doing okay and her mood is fairly good but she is worried she could relapsed into either mania or depression. I  told her we could try Tegretol since Lamictal didn't work and Depakote cause drowsiness .  Marland Kitchen    Current Outpatient Prescriptions  Medication Sig Dispense Refill  . ALPRAZolam (XANAX) 1 MG tablet Take 1 tablet (1 mg total) by mouth 2 (two) times daily.  60 tablet  2  . carbamazepine (TEGRETOL) 200 MG tablet Take one at bedtime for one week, then one twice a dya  60 tablet  2  . DULoxetine (CYMBALTA) 60 MG capsule Take 1 capsule (60 mg total) by mouth 2 (two) times daily.  60 capsule  2  . HYDROcodone-acetaminophen (NORCO) 10-325 MG per tablet Take 1 tablet by mouth every 6 (six) hours as needed.      Marland Kitchen levothyroxine (SYNTHROID, LEVOTHROID) 150 MCG tablet Take 150 mcg by mouth daily before breakfast.      . prazosin (MINIPRESS) 5 MG capsule Take 1 capsule (5 mg total) by mouth at bedtime.  30 capsule  2  . rOPINIRole (REQUIP) 1 MG tablet Take 1 mg by mouth daily.      Marland Kitchen topiramate (TOPAMAX) 50 MG tablet Take 100 mg by mouth daily.        No current facility-administered medications for this visit.   Allergies: Allergies  Allergen Reactions  . Abilify [Aripiprazole] Other (See Comments)    Very violent  . Gabapentin Other (See Comments)    Nightmares and talking to her nightmares  . Thorazine [Chlorpromazine] Other (See  Comments)    Shakes, anxiety off the roof and panic attack  . Seroquel [Quetiapine Fumarate]    Medical History: Past Medical History  Diagnosis Date  . DVT (deep venous thrombosis)   . Anxiety   . Bipolar disorder   . Obsessive-compulsive disorder   . Depression   . Thyroid disease   . Headache(784.0)   . Hypothyroidism   . Hypertension    Surgical History: Past Surgical History  Procedure Laterality Date  . Spine surgery    . Cholecystectomy    . Back surgery     Family History: family history includes ADD / ADHD in her daughter; Anxiety disorder in her daughter; Bipolar disorder in her daughter, maternal aunt, and mother; Diabetes in her father; Drug  abuse in her daughter and daughter; Heart disease in her brother, father, and mother; Hyperlipidemia in her brother, father, and mother; Hypertension in her brother, father, mother, and sister; Suicidality in her maternal aunt. There is no history of Alcohol abuse, Dementia, OCD, Paranoid behavior, Schizophrenia, Seizures, Sexual abuse, or Physical abuse. Reviewed and nothing new this visit.  Diagnosis:   Axis I: Bipolar, depressed Axis II: Deferred Axis III: Hypothyroidism, history of DVT  Axis IV: other psychosocial or environmental problems Axis V: 44  ADL's:  Intact  Sleep: Poor  Appetite:  Good  Suicidal Ideation:  Pt denies any thoughts, plans, intent of suicide Homicidal Ideation:  Pt denies any thoughts, plans, intent of homicide  AEB (as evidenced by):per pt report  Mental Status Examination/Evaluation: Objective:  Appearance: Casual  Eye Contact::  Good  Speech:  Clear and Coherent  Volume:  Normal  Mood:  Fairly good today   Affect:  Congruent  Thought Process:  Coherent  Orientation:  Full  Thought Content:  WDL  Suicidal Thoughts:  No  Homicidal Thoughts:  No  Memory:  Immediate;   Poor Recent;   Fair Remote;   Poor  Judgement:  Good  Insight:  Fair  Psychomotor Activity:  Normal  Concentration:  Poor  Recall:  Poor  Akathisia:  No  Handed:  Left  AIMS (if indicated):     Assets:  Communication Skills Desire for Improvement  Sleep:   too much   Vitals: BP 120/80  Ht 5\' 10"  (1.778 m)  Wt 216 lb (97.977 kg)  BMI 30.99 kg/m2  Lab Results:  Results for orders placed in visit on 03/08/14 (from the past 8736 hour(s))  VALPROIC ACID LEVEL   Collection Time    03/23/14  8:50 AM      Result Value Ref Range   Valproic Acid Lvl 61.2  50.0 - 100.0 ug/mL     Physical Findings: AIMS:  , ,  ,  ,    CIWA:    COWS:    Plan/Discussion: I took her vitals.  I reviewed CC, tobacco/med/surg Hx, meds effects/ side effects, problem list, therapies and  responses as well as current situation/symptoms discussed options. She'llcontinue  Cymbalta 60 mg every morning and Xanax 1 mg twice a day and Minipress 5 mg each bedtime. she'll start Tegretol 200 mg each bedtime for one week and then advanced to 200 mg twice a day. She will return in four-weeks  MEDICATIONS this encounter: Meds ordered this encounter  Medications  . prazosin (MINIPRESS) 5 MG capsule    Sig: Take 1 capsule (5 mg total) by mouth at bedtime.    Dispense:  30 capsule    Refill:  2  . DULoxetine (CYMBALTA) 60  MG capsule    Sig: Take 1 capsule (60 mg total) by mouth 2 (two) times daily.    Dispense:  60 capsule    Refill:  2  . carbamazepine (TEGRETOL) 200 MG tablet    Sig: Take one at bedtime for one week, then one twice a dya    Dispense:  60 tablet    Refill:  2  . ALPRAZolam (XANAX) 1 MG tablet    Sig: Take 1 tablet (1 mg total) by mouth 2 (two) times daily.    Dispense:  60 tablet    Refill:  2    Medical Decision Making Problem Points:  Established problem, worsening (2), Review of last therapy session (1) and Review of psycho-social stressors (1) Data Points:  Review or order clinical lab tests (1) Review of medication regiment & side effects (2) Review of new medications or change in dosage (2)  I certify that outpatient services furnished can reasonably be expected to improve the patient's condition.   [name], MD

## 2014-04-18 ENCOUNTER — Ambulatory Visit (HOSPITAL_COMMUNITY): Payer: Self-pay | Admitting: Psychiatry

## 2014-04-22 ENCOUNTER — Ambulatory Visit (HOSPITAL_COMMUNITY): Payer: Self-pay | Admitting: Psychology

## 2014-04-25 ENCOUNTER — Encounter (HOSPITAL_COMMUNITY): Payer: Self-pay | Admitting: Psychiatry

## 2014-04-25 ENCOUNTER — Ambulatory Visit (INDEPENDENT_AMBULATORY_CARE_PROVIDER_SITE_OTHER): Payer: 59 | Admitting: Psychiatry

## 2014-04-25 VITALS — BP 140/90 | Ht 70.0 in | Wt 216.0 lb

## 2014-04-25 DIAGNOSIS — F3132 Bipolar disorder, current episode depressed, moderate: Secondary | ICD-10-CM

## 2014-04-25 DIAGNOSIS — F3130 Bipolar disorder, current episode depressed, mild or moderate severity, unspecified: Secondary | ICD-10-CM

## 2014-04-25 NOTE — Progress Notes (Signed)
Patient ID: Jane [name], female   DOB: Apr 23, 1966, 48 y.o.   MRN: [id number] Patient ID: Jane [name], female   DOB: Dec 18, 1965, 48 y.o.   MRN: [id number] Patient ID: Jane [name], female   DOB: 22-Feb-1966, 48 y.o.   MRN: [id number] Patient ID: Jane [name], female   DOB: April 07, 1966, 48 y.o.   MRN: [id number] Patient ID: Jane [name], female   DOB: 1965/11/06, 48 y.o.   MRN: [id number] Patient ID: Jane [name], female   DOB: October 29, 1966, 48 y.o.   MRN: [id number] Patient ID: Jane [name], female   DOB: 11-14-1965, 48 y.o.   MRN: [id number] Patient ID: Jane [name], female   DOB: July 19, 1966, 48 y.o.   MRN: [id number] Patient ID: Jane [name], female   DOB: [birth year], 48 y.o.   MRN: [id number] [facility] [address] Progress Note Jane [name] MRN: [id number] DOB: 04/29/1966 Age: 48 y.o.  Date: 04/25/2014 Start Time: 1:45 PM End Time: 2:10 PM  Chief Complaint: Chief Complaint  Patient presents with  . Anxiety  . Depression  . Follow-up   Subjective: This patient is a 48 year-old divorced white female who lives alone in [place]. She is on disability due to bipolar disorder.  The patient states that she became very depressed when her mother died in 02/19/2000 of a brain aneurysm. She started getting. Sad but eventually became manic and develop mood swings and agitated behavior. She's been on numerous medications over the years. In 19-Feb-1988 she also had postpartum depression. For many years she was on lithium but it caused a bad taste in her mouth and her doctor was worried it might be starting to affect her renal function so was discontinued. She also thinks that it stopped working after a number of years.  The patient returns after one month. She was not able to tolerate Tegretol due to drowsiness. In fact she stopped all her psychiatric medications including Tegretol duloxetine and Minipress. She states that she feels better without them. She does have a lot of pain from degenerative  disc disease in her doctor is looking into getting her an injection. This is keeping her from doing her exercising. She still gets angry with her daughter but generally her mood has been stable and she's not at all suicidal. She's continuing to see her counselor here. She would like to "clean out her system". She still has the Xanax if she needs it for anxiety .  Marland Kitchen    Current Outpatient Prescriptions  Medication Sig Dispense Refill  . ALPRAZolam (XANAX) 1 MG tablet Take 1 tablet (1 mg total) by mouth 2 (two) times daily.  60 tablet  2  . HYDROcodone-acetaminophen (NORCO) 10-325 MG per tablet Take 1 tablet by mouth every 6 (six) hours as needed.      Marland Kitchen levothyroxine (SYNTHROID, LEVOTHROID) 150 MCG tablet Take 150 mcg by mouth daily before breakfast.      . rOPINIRole (REQUIP) 1 MG tablet Take 1 mg by mouth daily.      Marland Kitchen topiramate (TOPAMAX) 50 MG tablet Take 100 mg by mouth daily.        No current facility-administered medications for this visit.   Allergies: Allergies  Allergen Reactions  . Abilify [Aripiprazole] Other (See Comments)    Very violent  . Gabapentin Other (See Comments)    Nightmares and talking to her nightmares  . Thorazine [Chlorpromazine] Other (See Comments)    Shakes, anxiety off the roof and panic attack  . Seroquel [Quetiapine Fumarate]    Medical History: Past  Medical History  Diagnosis Date  . DVT (deep venous thrombosis)   . Anxiety   . Bipolar disorder   . Obsessive-compulsive disorder   . Depression   . Thyroid disease   . Headache(784.0)   . Hypothyroidism   . Hypertension    Surgical History: Past Surgical History  Procedure Laterality Date  . Spine surgery    . Cholecystectomy    . Back surgery     Family History: family history includes ADD / ADHD in her daughter; Anxiety disorder in her daughter; Bipolar disorder in her daughter, maternal aunt, and mother; Diabetes in her father; Drug abuse in her daughter and daughter; Heart disease in  her brother, father, and mother; Hyperlipidemia in her brother, father, and mother; Hypertension in her brother, father, mother, and sister; Suicidality in her maternal aunt. There is no history of Alcohol abuse, Dementia, OCD, Paranoid behavior, Schizophrenia, Seizures, Sexual abuse, or Physical abuse. Reviewed and nothing new this visit.  Diagnosis:   Axis I: Bipolar, depressed Axis II: Deferred Axis III: Hypothyroidism, history of DVT  Axis IV: other psychosocial or environmental problems Axis V: 77  ADL's:  Intact  Sleep: Poor  Appetite:  Good  Suicidal Ideation:  Pt denies any thoughts, plans, intent of suicide Homicidal Ideation:  Pt denies any thoughts, plans, intent of homicide  AEB (as evidenced by):per pt report  Mental Status Examination/Evaluation: Objective:  Appearance: Casual  Eye Contact::  Good  Speech:  Clear and Coherent  Volume:  Normal  Mood:  Fairly good today   Affect:  Congruent  Thought Process:  Coherent  Orientation:  Full  Thought Content:  WDL  Suicidal Thoughts:  No  Homicidal Thoughts:  No  Memory:  Immediate;   Poor Recent;   Fair Remote;   Poor  Judgement:  Good  Insight:  Fair  Psychomotor Activity:  Normal  Concentration:  Poor  Recall:  Poor  Akathisia:  No  Handed:  Left  AIMS (if indicated):     Assets:  Communication Skills Desire for Improvement  Sleep:   too much   Vitals: BP 140/90  Ht 5\' 10"  (1.778 m)  Wt 216 lb (97.977 kg)  BMI 30.99 kg/m2  Lab Results:  Results for orders placed in visit on 03/08/14 (from the past 8736 hour(s))  VALPROIC ACID LEVEL   Collection Time    03/23/14  8:50 AM      Result Value Ref Range   Valproic Acid Lvl 61.2  50.0 - 100.0 ug/mL     Physical Findings: AIMS:  , ,  ,  ,    CIWA:    COWS:    Plan/Discussion: I took her vitals.  I reviewed CC, tobacco/med/surg Hx, meds effects/ side effects, problem list, therapies and responses as well as current situation/symptoms discussed  options. She'llcontinue Xanax 1 mg twice a day as needed for anxiety. For now she wants to stay off all other medications and continue her counseling.Marland Kitchen She will return in 6-weeks  MEDICATIONS this encounter: No orders of the defined types were placed in this encounter.    Medical Decision Making Problem Points:  Established problem, worsening (2), Review of last therapy session (1) and Review of psycho-social stressors (1) Data Points:  Review or order clinical lab tests (1) Review of medication regiment & side effects (2) Review of new medications or change in dosage (2)  I certify that outpatient services furnished can reasonably be expected to improve the patient's condition.   Xylah Early,  Neoma Laming, MD

## 2014-04-28 ENCOUNTER — Other Ambulatory Visit (HOSPITAL_COMMUNITY): Payer: Self-pay | Admitting: Pulmonary Disease

## 2014-04-28 DIAGNOSIS — M545 Low back pain: Secondary | ICD-10-CM

## 2014-05-05 ENCOUNTER — Ambulatory Visit (HOSPITAL_COMMUNITY): Payer: Self-pay | Admitting: Psychiatry

## 2014-05-10 ENCOUNTER — Ambulatory Visit (HOSPITAL_COMMUNITY): Payer: Medicaid Other

## 2014-05-10 ENCOUNTER — Telehealth (HOSPITAL_COMMUNITY): Payer: Self-pay | Admitting: *Deleted

## 2014-05-10 NOTE — Telephone Encounter (Signed)
done

## 2014-05-11 ENCOUNTER — Ambulatory Visit (HOSPITAL_COMMUNITY)
Admission: RE | Admit: 2014-05-11 | Discharge: 2014-05-11 | Disposition: A | Discharge status: Home / Self Care | Payer: Medicaid Other | Source: Ambulatory Visit | Attending: Pulmonary Disease | Admitting: Pulmonary Disease

## 2014-05-11 DIAGNOSIS — M5126 Other intervertebral disc displacement, lumbar region: Secondary | ICD-10-CM | POA: Diagnosis not present

## 2014-05-11 DIAGNOSIS — G8929 Other chronic pain: Secondary | ICD-10-CM | POA: Diagnosis not present

## 2014-05-11 DIAGNOSIS — M545 Low back pain, unspecified: Secondary | ICD-10-CM | POA: Diagnosis present

## 2014-05-11 DIAGNOSIS — M79609 Pain in unspecified limb: Secondary | ICD-10-CM | POA: Diagnosis not present

## 2014-05-11 DIAGNOSIS — M5380 Other specified dorsopathies, site unspecified: Secondary | ICD-10-CM | POA: Diagnosis not present

## 2014-05-11 DIAGNOSIS — Z981 Arthrodesis status: Secondary | ICD-10-CM | POA: Diagnosis not present

## 2014-05-19 ENCOUNTER — Encounter (HOSPITAL_COMMUNITY): Payer: Self-pay | Admitting: Psychology

## 2014-05-19 ENCOUNTER — Ambulatory Visit (INDEPENDENT_AMBULATORY_CARE_PROVIDER_SITE_OTHER): Payer: 59 | Admitting: Psychology

## 2014-05-19 DIAGNOSIS — F310 Bipolar disorder, current episode hypomanic: Secondary | ICD-10-CM

## 2014-05-19 DIAGNOSIS — F3110 Bipolar disorder, current episode manic without psychotic features, unspecified: Secondary | ICD-10-CM
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
## 2014-05-25 ENCOUNTER — Ambulatory Visit (HOSPITAL_COMMUNITY)
Admission: RE | Admit: 2014-05-25 | Discharge: 2014-05-25 | Disposition: A | Discharge status: Home / Self Care | Payer: Medicaid Other | Source: Ambulatory Visit | Attending: Pulmonary Disease | Admitting: Pulmonary Disease

## 2014-05-25 DIAGNOSIS — M545 Low back pain, unspecified: Secondary | ICD-10-CM | POA: Insufficient documentation

## 2014-05-25 DIAGNOSIS — IMO0001 Reserved for inherently not codable concepts without codable children: Secondary | ICD-10-CM | POA: Insufficient documentation

## 2014-05-25 DIAGNOSIS — M79609 Pain in unspecified limb: Secondary | ICD-10-CM | POA: Diagnosis not present

## 2014-05-25 DIAGNOSIS — M5410 Radiculopathy, site unspecified: Secondary | ICD-10-CM | POA: Insufficient documentation

## 2014-05-25 DIAGNOSIS — R209 Unspecified disturbances of skin sensation: Secondary | ICD-10-CM | POA: Diagnosis not present

## 2014-05-25 NOTE — Evaluation (Addendum)
Physical Therapy Evaluation  Patient Details  Name: Jane [name] MRN: [id number] Date of Birth: 25-Oct-1966  Today's Date: 05/25/2014 Time: 1300-1345 PT Time Calculation (min): 45 min Charge:  evaluation             Visit#: 1 of 1   Authorization: medicaid    Past Medical History:  Past Medical History  Diagnosis Date  . DVT (deep venous thrombosis)   . Anxiety   . Bipolar disorder   . Obsessive-compulsive disorder   . Depression   . Thyroid disease   . Headache(784.0)   . Hypothyroidism   . Hypertension    Past Surgical History:  Past Surgical History  Procedure Laterality Date  . Spine surgery    . Cholecystectomy    . Back surgery      Subjective Symptoms/Limitations Symptoms: Pt states that she has had two previous surgeries in her back.  She has currently had an MRI which shows a moderate lateral bulge L4-5. She injured her back in April of 2015 but continued to work out and mow the lawn and the pain increased.  The pain goes down her Lt leg to her toes and she occasionally has numbness. She has been refered to PT for education.   How long can you sit comfortably?: Pt has immediate pain with any position she is constantly moving after 5 minutes How long can you stand comfortably?: 5 minutes  How long can you walk comfortably?: 5 minutes   Pain Assessment Currently in Pain?: Yes Pain Score: 9  Pain Location: Back Pain Orientation: Left;Lateral Pain Onset: More than a month ago Pain Frequency: Constant Pain Relieving Factors: medication Effect of Pain on Daily Activities: increases pain     Balance Screening  no falls  Assessment LLE Assessment LLE Assessment: Exceptions to WFL-weakness LLE AROM (degrees)-wfl Lumbar Assessment Lumbar Assessment: Exceptions to [facility] decreased flexion  Exercise/Treatments     Stretches Active Hamstring Stretch: 3 reps;30 seconds Single Knee to Chest Stretch: 3 reps;30 seconds Prone on Elbows Stretch: 2 reps Press Ups:  3 reps Supine Ab Set: 5 reps Bent Knee Raise: 5 reps Bridge: 5 reps  Prone  Other Prone Lumbar Exercises: heel squeeze x 5    Educated in body mechanics as well as posture and the significance it has on the back.     Physical Therapy Assessment and Plan PT Assessment and Plan Clinical Impression Statement: Pt  is a 48 yo female with radicular back pain.  Pt was referred to therapy.  Session concentrated on education as well as HEP.  Pt has medicaid which will only pay for the evaluation for this diagnosis pt requests one time education visit.  PT Plan: Pt to complete HEP given go her by therapist and strive for better sitting and standing posture.    Goals Home Exercise Program Pt/caregiver will Perform Home Exercise Program: For increased strengthening  Problem List Patient Active Problem List   Diagnosis Date Noted  . Radicular low back pain 05/25/2014  . Depression, major, recurrent 06/17/2013  . OCD (obsessive compulsive disorder) 11/20/2012  . 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 Bipolar 1 disorder 09/25/2012  . DVT (deep venous thrombosis) 08/18/2012    PT Plan of Care PT Home Exercise Plan: given  GP    RUSSELL,CINDY 05/25/2014, 4:06 PM  Physician Documentation Your signature is required to indicate approval of the treatment plan as stated above.  Please sign and either send electronically or make a copy of this report  for your files and return this physician signed original.   Please mark one 1.__approve of plan  2. ___approve of plan with the following conditions.   ______________________________                                                          _____________________ Physician Signature                                                                                                             Date

## 2014-06-06 ENCOUNTER — Ambulatory Visit (INDEPENDENT_AMBULATORY_CARE_PROVIDER_SITE_OTHER): Payer: 59 | Admitting: Psychiatry

## 2014-06-06 ENCOUNTER — Encounter (HOSPITAL_COMMUNITY): Payer: Self-pay | Admitting: Psychiatry

## 2014-06-06 VITALS — BP 104/76 | HR 64 | Ht 70.0 in | Wt 213.2 lb

## 2014-06-06 DIAGNOSIS — F310 Bipolar disorder, current episode hypomanic: Secondary | ICD-10-CM

## 2014-06-06 DIAGNOSIS — F3110 Bipolar disorder, current episode manic without psychotic features, unspecified: Secondary | ICD-10-CM

## 2014-06-06 MED ORDER — ALPRAZOLAM 1 MG PO TABS: 1.0000 mg | ORAL_TABLET | Freq: Two times a day (BID) | ORAL | Status: DC
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
## 2014-06-10 ENCOUNTER — Ambulatory Visit (HOSPITAL_COMMUNITY): Payer: Self-pay | Admitting: Psychology

## 2014-06-22 ENCOUNTER — Ambulatory Visit (INDEPENDENT_AMBULATORY_CARE_PROVIDER_SITE_OTHER): Payer: MEDICAID | Admitting: Psychology

## 2014-06-22 DIAGNOSIS — F310 Bipolar disorder, current episode hypomanic: Secondary | ICD-10-CM

## 2014-06-22 DIAGNOSIS — F429 Obsessive-compulsive disorder, unspecified: Secondary | ICD-10-CM

## 2014-06-22 DIAGNOSIS — F3110 Bipolar disorder, current episode manic without psychotic features, unspecified: Secondary | ICD-10-CM

## 2014-06-24 ENCOUNTER — Encounter (HOSPITAL_COMMUNITY): Payer: Self-pay | Admitting: Psychology

## 2014-06-24 NOTE — Progress Notes (Signed)
    PROGRESS NOTE Patient:  Jane [name]   DOB: 1966/10/03  MR Number: [id number]  Location: [facility] [address] [place] [address] Dept: [phone]  Start: 3 PM End: 4 PM  Provider/Observer:     Edgardo Roys PSYD  Chief Complaint:      Chief Complaint  Patient presents with  . Agitation  . Stress  . Anxiety    Reason For Service:     the patient was referred because of issues associated with bipolar disorder and has been displaying manic episodes as well as symptoms of depression and social isolation.  The patient also has significant obsessive-compulsive disorder symptoms and other anxiety symptoms.  The patient has displayed poor coping skills, further exacerbated by her overall poor functioning.  Interventions Strategy:  Cognitive/behavioral psychotherapeutic interventions  Participation Level:   Active  Participation Quality:  Appropriate      Behavioral Observation:  Well Groomed, Alert, and Blunt.   Current Psychosocial Factors: The patient reports that things continue to go poorly for her in her life the patient reports that she and her daughters continued to be separated and that even now she rarely sees her grandkids. The patient reports that she has begun making plans to move to [place] to live with her sister wanting to get away from the constant stress she is in being around her daughters. The patient felt comforted by this plan. She reports that she will look to see how things go over the next couple of months and plans to look at moving there in the springtime.  Content of Session:   Review current symptoms and continued to work on therapeutic interventions for maintaining stable mood.  Current Status:   The patient reports that she has done better as far as her emotional stability recently and is making what she feels like clear decisions. The patient reports  that she's been back on her psychotropic medications which have helped her mood stability and that she's been actively working on coping skills and decision-making.  Patient Progress:   She reports that she had a very difficult last week with major stressors but has done much better this week  Target Goals:   Target goals include maintaining mood stability, maintaining the ability to live independently, and working on reducing the frequency, intensity, and duration of panic attacks.  Last Reviewed:   06/22/2014  Goals Addressed Today:    Today we worked on issues related to mood stability.  Impression/Diagnosis:   the patient has a long-standing history of bipolar disorder.  However, psychosocial issues and difficulty coping with a wide range of situations further impair our ability to function effectively in two key per psychiatric symptoms in check.  Diagnosis:    Axis I:  Bipolar disorder, current episode hypomanic  OCD (obsessive compulsive disorder)      Axis II: No diagnosis      Saki Legore R, PsyD 06/24/2014

## 2014-07-26 ENCOUNTER — Ambulatory Visit (HOSPITAL_COMMUNITY): Payer: Self-pay | Admitting: Psychology

## 2014-08-03 ENCOUNTER — Ambulatory Visit (INDEPENDENT_AMBULATORY_CARE_PROVIDER_SITE_OTHER): Payer: MEDICAID | Admitting: Psychology

## 2014-08-03 DIAGNOSIS — F3110 Bipolar disorder, current episode manic without psychotic features, unspecified: Secondary | ICD-10-CM

## 2014-08-03 DIAGNOSIS — F310 Bipolar disorder, current episode hypomanic: Secondary | ICD-10-CM

## 2014-08-03 DIAGNOSIS — F429 Obsessive-compulsive disorder, unspecified: Secondary | ICD-10-CM

## 2014-08-04 ENCOUNTER — Encounter (HOSPITAL_COMMUNITY): Payer: Self-pay | Admitting: Psychology

## 2014-08-04 NOTE — Progress Notes (Signed)
    PROGRESS NOTE Patient:  Jane [name]   DOB: [birth year]  MR Number: [id number]  Location: [facility] [address] [place] [address] Dept: [phone]  Start: 3 PM End: 4 PM  Provider/Observer:     Edgardo Roys PSYD  Chief Complaint:      Chief Complaint  Patient presents with  . Anxiety  . Depression  . Agitation  . Stress    Reason For Service:     the patient was referred because of issues associated with bipolar disorder and has been displaying manic episodes as well as symptoms of depression and social isolation.  The patient also has significant obsessive-compulsive disorder symptoms and other anxiety symptoms.  The patient has displayed poor coping skills, further exacerbated by her overall poor functioning.  Interventions Strategy:  Cognitive/behavioral psychotherapeutic interventions  Participation Level:   Active  Participation Quality:  Appropriate      Behavioral Observation:  Well Groomed, Alert, and Blunt.   Current Psychosocial Factors: The patient reports that the family has been putting a lot of pressure on her to let her aunt come live with her after her brother died (who was living with aunt).  The anunt has been asked to move out of her home and really can not take care of herself.  However, there has been major conflict between the two in the past and this would be a very bad idea for the patient and the family appears to be just looking at an easy answer for themselves and not what is right for the patient.  Content of Session:   Review current symptoms and continued to work on therapeutic interventions for maintaining stable mood.  Current Status:   The patient reports that she continues to be making efforts at building coping skills and has been working on making better long term decisions.  Patient Progress:   She reports that she had a very difficult  last week with major stressors but has done much better this week  Target Goals:   Target goals include maintaining mood stability, maintaining the ability to live independently, and working on reducing the frequency, intensity, and duration of panic attacks.  Last Reviewed:   08/03/2014  Goals Addressed Today:    Today we worked on issues related to mood stability.  Impression/Diagnosis:   the patient has a long-standing history of bipolar disorder.  However, psychosocial issues and difficulty coping with a wide range of situations further impair our ability to function effectively in two key per psychiatric symptoms in check.  Diagnosis:    Axis I:  Bipolar disorder, current episode hypomanic  OCD (obsessive compulsive disorder)      Axis II: No diagnosis      RODENBOUGH,JOHN R, PsyD 08/04/2014

## 2014-08-08 ENCOUNTER — Ambulatory Visit (HOSPITAL_COMMUNITY): Payer: Self-pay | Admitting: Psychiatry

## 2014-08-17 ENCOUNTER — Other Ambulatory Visit: Payer: Self-pay | Admitting: Pulmonary Disease

## 2014-08-17 DIAGNOSIS — R921 Mammographic calcification found on diagnostic imaging of breast: Secondary | ICD-10-CM

## 2014-08-22 ENCOUNTER — Ambulatory Visit (INDEPENDENT_AMBULATORY_CARE_PROVIDER_SITE_OTHER): Payer: MEDICAID | Admitting: Psychiatry

## 2014-08-22 ENCOUNTER — Encounter (HOSPITAL_COMMUNITY): Payer: Self-pay | Admitting: Psychiatry

## 2014-08-22 VITALS — BP 128/76 | HR 54 | Ht 70.0 in | Wt 218.6 lb

## 2014-08-22 DIAGNOSIS — F3130 Bipolar disorder, current episode depressed, mild or moderate severity, unspecified: Secondary | ICD-10-CM

## 2014-08-22 DIAGNOSIS — F310 Bipolar disorder, current episode hypomanic: Secondary | ICD-10-CM

## 2014-08-22 MED ORDER — ALPRAZOLAM 1 MG PO TABS: 1.0000 mg | ORAL_TABLET | Freq: Two times a day (BID) | ORAL | Status: DC

## 2014-08-22 NOTE — Progress Notes (Signed)
Patient ID: Jane [name], female   DOB: Sep 13, 1966, 48 y.o.   MRN: [id number] Patient ID: Jane [name], female   DOB: 11-28-1965, 48 y.o.   MRN: [id number] Patient ID: Jane [name], female   DOB: July 19, 1966, 48 y.o.   MRN: [id number] Patient ID: Jane [name], female   DOB: [birth year], 48 y.o.   MRN: [id number] Patient ID: Jane [name], female   DOB: 06-11-1966, 48 y.o.   MRN: [id number] Patient ID: Jane [name], female   DOB: 01-19-1966, 48 y.o.   MRN: [id number] Patient ID: Jane [name], female   DOB: [birth year], 48 y.o.   MRN: [id number] Patient ID: Jane [name], female   DOB: 1966-05-08, 48 y.o.   MRN: [id number] Patient ID: Jane [name], female   DOB: [birth year], 48 y.o.   MRN: [id number] Patient ID: Jane [name], female   DOB: 06-30-1966, 48 y.o.   MRN: [id number] Patient ID: Jane [name], female   DOB: 05-05-1966, 48 y.o.   MRN: [id number] [facility] [address] Progress Note Jane [name] MRN: [id number] DOB: September 21, 1966 Age: 48 y.o.  Date: 08/22/2014 Start Time: 1:45 PM End Time: 2:10 PM  Chief Complaint: Chief Complaint  Patient presents with  . Anxiety  . Depression  . Follow-up   Subjective: This patient is a 48 year-old divorced white female who lives alone in [place]. She is on disability due to bipolar disorder.  The patient states that she became very depressed when her mother died in 18-Feb-2000 of a brain aneurysm. She started getting. Sad but eventually became manic and develop mood swings and agitated behavior. She's been on numerous medications over the years. In February 18, 1988 she also had postpartum depression. For many years she was on lithium but it caused a bad taste in her mouth and her doctor was worried it might be starting to affect her renal function so was discontinued. She also thinks that it stopped working after a number of years.  The patient returns after 2 months. She had a mammogram last month which showed a suspicious area. She has to go in for a  biopsy in 2 days. She's very worried about it because many people in her family have had breast cancer. She hasn't told anyone except her sister and her boyfriend.  she's been getting yearly mammograms and this is the first thing that is shown out so is being caught early. Her mood has been fairly stable and she doesn't feel like she needs mood stabilizers and antidepressants. The Xanax is helping her anxiety particularly in dealing with her daughter .  Marland Kitchen    Current Outpatient Prescriptions  Medication Sig Dispense Refill  . ALPRAZolam (XANAX) 1 MG tablet Take 1 tablet (1 mg total) by mouth 2 (two) times daily.  60 tablet  2  . amLODipine (NORVASC) 5 MG tablet Take 5 mg by mouth daily.      Marland Kitchen HYDROcodone-acetaminophen (NORCO) 10-325 MG per tablet Take 1 tablet by mouth daily.       Marland Kitchen levothyroxine (SYNTHROID, LEVOTHROID) 150 MCG tablet Take 150 mcg by mouth daily before breakfast.      . rOPINIRole (REQUIP) 1 MG tablet Take 1 mg by mouth daily.      Marland Kitchen topiramate (TOPAMAX) 50 MG tablet Take 200 mg by mouth 2 (two) times daily.        No current facility-administered medications for this visit.   Allergies: Allergies  Allergen Reactions  . Abilify [Aripiprazole] Other (See Comments)    Very violent  . Gabapentin Other (See Comments)  Nightmares and talking to her nightmares  . Thorazine [Chlorpromazine] Other (See Comments)    Shakes, anxiety off the roof and panic attack  . Seroquel [Quetiapine Fumarate]    Medical History: Past Medical History  Diagnosis Date  . DVT (deep venous thrombosis)   . Anxiety   . Bipolar disorder   . Obsessive-compulsive disorder   . Depression   . Thyroid disease   . Headache(784.0)   . Hypothyroidism   . Hypertension    Surgical History: Past Surgical History  Procedure Laterality Date  . Spine surgery    . Cholecystectomy    . Back surgery     Family History: family history includes ADD / ADHD in her daughter; Anxiety disorder in her  daughter; Bipolar disorder in her daughter, maternal aunt, and mother; Diabetes in her father; Drug abuse in her daughter and daughter; Heart disease in her brother, father, and mother; Hyperlipidemia in her brother, father, and mother; Hypertension in her brother, father, mother, and sister; Suicidality in her maternal aunt. There is no history of Alcohol abuse, Dementia, OCD, Paranoid behavior, Schizophrenia, Seizures, Sexual abuse, or Physical abuse. Reviewed and nothing new this visit.  Diagnosis:   Axis I: Bipolar, depressed Axis II: Deferred Axis III: Hypothyroidism, history of DVT  Axis IV: other psychosocial or environmental problems Axis V: 30  ADL's:  Intact  Sleep: Poor  Appetite:  Good  Suicidal Ideation:  Pt denies any thoughts, plans, intent of suicide Homicidal Ideation:  Pt denies any thoughts, plans, intent of homicide  AEB (as evidenced by):per pt report  Mental Status Examination/Evaluation: Objective:  Appearance: Casual  Eye Contact::  Good  Speech:  Clear and Coherent  Volume:  Normal  Mood:  Fairly good today little tearful when talking about the mammogram results   Affect:  Congruent  Thought Process:  Coherent  Orientation:  Full  Thought Content:  WDL  Suicidal Thoughts:  No  Homicidal Thoughts:  No  Memory:  Immediate;   Poor Recent;   Fair Remote;   Poor  Judgement:  Good  Insight:  Fair  Psychomotor Activity:  Normal  Concentration:  Poor  Recall:  Poor  Akathisia:  No  Handed:  Left  AIMS (if indicated):     Assets:  Communication Skills Desire for Improvement  Sleep:   too much   Vitals: BP 128/76  Pulse 54  Ht 5\' 10"  (1.778 m)  Wt 218 lb 9.6 oz (99.156 kg)  BMI 31.37 kg/m2  Lab Results:  Results for orders placed in visit on 03/08/14 (from the past 8736 hour(s))  VALPROIC ACID LEVEL   Collection Time    03/23/14  8:50 AM      Result Value Ref Range   Valproic Acid Lvl 61.2  50.0 - 100.0 ug/mL     Physical  Findings: AIMS:  , ,  ,  ,    CIWA:    COWS:    Plan/Discussion: I took her vitals.  I reviewed CC, tobacco/med/surg Hx, meds effects/ side effects, problem list, therapies and responses as well as current situation/symptoms discussed options. She'llcontinue Xanax 1 mg twice a day as needed for anxiety. For now she wants to stay off all other medications and continue her counseling.Marland Kitchen She will return in 2 months  MEDICATIONS this encounter: Meds ordered this encounter  Medications  . amLODipine (NORVASC) 5 MG tablet    Sig: Take 5 mg by mouth daily.  Marland Kitchen ALPRAZolam (XANAX) 1 MG tablet  Sig: Take 1 tablet (1 mg total) by mouth 2 (two) times daily.    Dispense:  60 tablet    Refill:  2    Medical Decision Making Problem Points:  Established problem, worsening (2), Review of last therapy session (1) and Review of psycho-social stressors (1) Data Points:  Review or order clinical lab tests (1) Review of medication regiment & side effects (2) Review of new medications or change in dosage (2)  I certify that outpatient services furnished can reasonably be expected to improve the patient's condition.   [name], MD

## 2014-08-24 ENCOUNTER — Ambulatory Visit
Admission: RE | Admit: 2014-08-24 | Discharge: 2014-08-24 | Disposition: A | Discharge status: Home / Self Care | Payer: MEDICAID | Source: Ambulatory Visit | Attending: Pulmonary Disease | Admitting: Pulmonary Disease

## 2014-08-24 DIAGNOSIS — R921 Mammographic calcification found on diagnostic imaging of breast: Secondary | ICD-10-CM

## 2014-09-02 ENCOUNTER — Ambulatory Visit (INDEPENDENT_AMBULATORY_CARE_PROVIDER_SITE_OTHER): Payer: MEDICAID | Admitting: Psychology

## 2014-09-02 ENCOUNTER — Encounter (HOSPITAL_COMMUNITY): Payer: Self-pay | Admitting: Psychology

## 2014-09-02 DIAGNOSIS — F429 Obsessive-compulsive disorder, unspecified: Secondary | ICD-10-CM

## 2014-09-02 DIAGNOSIS — F42 Obsessive-compulsive disorder: Secondary | ICD-10-CM

## 2014-09-02 DIAGNOSIS — F310 Bipolar disorder, current episode hypomanic: Secondary | ICD-10-CM

## 2014-09-02 NOTE — Progress Notes (Signed)
    PROGRESS NOTE Patient:  Jane [name]   DOB: [birth year]  MR Number: [id number]  Location: [facility] [address] [place] [address] Dept: [phone]  Start: 11 AM End: 12 PM  Provider/Observer:     Edgardo Roys PSYD  Chief Complaint:      Chief Complaint  Patient presents with  . Agitation  . Anxiety  . Stress    Reason For Service:     the patient was referred because of issues associated with bipolar disorder and has been displaying manic episodes as well as symptoms of depression and social isolation.  The patient also has significant obsessive-compulsive disorder symptoms and other anxiety symptoms.  The patient has displayed poor coping skills, further exacerbated by her overall poor functioning.  Interventions Strategy:  Cognitive/behavioral psychotherapeutic interventions  Participation Level:   Active  Participation Quality:  Appropriate      Behavioral Observation:  Well Groomed, Alert, and Blunt.   Current Psychosocial Factors: The patient reports that she has been doing much better dealing with family and the issues that arise.  The patient reports that she has refused to take on responsibility of her aunt which had been suggested by her daughter. The patient in the aunt had not gotten along well in the recent past and there is a great deal manipulative behaviors by the aunt. The patient reports that she had a cold and still is suffering from that which has reduced her physical activity but she is hoping that clear up soon..  Content of Session:   Review current symptoms and continued to work on therapeutic interventions for maintaining stable mood.  Current Status:   The patient reports that she continues to be making efforts at building coping skills and has been working on making better long term decisions.  Patient Progress:   She reports that she had a very  difficult last week with major stressors  including a major stressor of an abnormal signal found on her mammogram. A biopsy was conducted and it was just found to be fatty tissue and not a tumor.  Target Goals:   Target goals include maintaining mood stability, maintaining the ability to live independently, and working on reducing the frequency, intensity, and duration of panic attacks.  Last Reviewed:   09/02/2014  Goals Addressed Today:    Today we worked on issues related to mood stability.  Impression/Diagnosis:   the patient has a long-standing history of bipolar disorder.  However, psychosocial issues and difficulty coping with a wide range of situations further impair our ability to function effectively in two key per psychiatric symptoms in check.  Diagnosis:    Axis I:  Bipolar disorder, current episode hypomanic  OCD (obsessive compulsive disorder)      Axis II: No diagnosis      Lenise Jr R, PsyD 09/02/2014

## 2014-09-22 ENCOUNTER — Encounter: Payer: Medicaid Other | Attending: Pulmonary Disease | Admitting: Nutrition

## 2014-09-22 VITALS — Ht 70.0 in | Wt 225.8 lb

## 2014-09-22 DIAGNOSIS — Z6832 Body mass index (BMI) 32.0-32.9, adult: Secondary | ICD-10-CM | POA: Insufficient documentation

## 2014-09-22 DIAGNOSIS — E669 Obesity, unspecified: Secondary | ICD-10-CM | POA: Insufficient documentation

## 2014-09-22 DIAGNOSIS — Z713 Dietary counseling and surveillance: Secondary | ICD-10-CM | POA: Diagnosis not present

## 2014-09-22 NOTE — Progress Notes (Signed)
  Medical Nutrition Therapy:  Appt start time: 1400 end time:  1500.   Assessment:  Primary concerns today: Overweight.  Lives by herself. Stays at home most of the time. Does own cooking and shopping. Eats mostly at home and eats baked, broiled, and fried foods. Exercise: Walks every other day for 30-40 minutes. Has Bipolar but is off any meds right now. Seeing her therapist next month. Admits to a lot of emotional eating and having cravings for sweets and salty foods. She reports possibly being prediabetic with impaired fasting blood sugars.   Preferred Learning Style:     No preference indicated    Learning Readiness:     Ready  Change in progress   MEDICATIONS: See list   DIETARY INTAKE:  24-hr recall:  B ( AM): 2 waffles, 16 oz skim milk, water OR cherrios with skim milk and fruit OR scrambled eggs with spinach and cheese, toast, milk Snk ( AM): not usually  L ( PM): Salmon, squash, 1 cup spinach, lemon water OR macaroni cheese and can of chicken, water Snk ( PM): cheetos or potatoe chips, water OR yogurt D ( PM): Protein blend southwest 2 cup(corn, black beans, grains, lentis, red peppers and fresh strawberries, water Snk ( PM): sometimes migh have fiber brownie or Beverages: water, diet coke and unsweet tea  Usual physical activity: 30 minutes  Estimated energy needs: 1500 calorie 180 g carbohydrates 80 g protein 50 g fat  Progress Towards Goal(s):  In progress.   Nutritional Diagnosis:  NB-1.1 Food and nutrition-related knowledge deficit As related to overweight.  As evidenced by BMI >30.    Intervention:  Nutrition counseling for weight loss and diabetes prevention.  Plan:  Aim for 2-3 Carb Choices per meal (30-45 grams) +/- 1 either way  No snacks between meal Avoid dipping sauces due to extra calories Measure foods out Do not skip meals Only bake and broil foods. Include protein in moderation with your meals  Consider  increasing your activity  level by 30-60 minutes daily as tolerated Avoid high salty foods. Increase water intake to 10 glasses of water per day. Goal: Lose 1-2 lbs per week til next visit. 2. Increase fresh fruits and vegetables. 3. Keep [name] less than 5.7%.  Teaching Method Utilized:  Visual Auditory Hands on  Handouts given during visit include:      The Plate Method Carb Counting and Food Label handouts Meal Plan Card  Barrriers to learning/adherence to lifestyle change: None  Demonstrated degree of understanding via:  Teach Back   Monitoring/Evaluation:  Dietary intake, exercise, meal planning, SBG, and body weight in 1 month(s).

## 2014-09-22 NOTE — Patient Instructions (Signed)
  Plan:  Aim for 2-3 Carb Choices per meal (30-45 grams) +/- 1 either way  No snacks between meal Avoid dipping sauces due to extra calories Measure foods out Do not skip meals Only bake and broil foods. Include protein in moderation with your meals  Consider  increasing your activity level by 30-60 minutes daily as tolerated Avoid high salty foods. Increase water intake to 10 glasses of water per day. Goal: Lose 1-2 lbs per week til next visit. 2. Increase fresh fruits and vegetables. 3. Keep [name] less than 5.7%.

## 2014-10-03 ENCOUNTER — Ambulatory Visit (HOSPITAL_COMMUNITY): Payer: Self-pay | Admitting: Psychology

## 2014-10-10 ENCOUNTER — Telehealth (HOSPITAL_COMMUNITY): Payer: Self-pay | Admitting: *Deleted

## 2014-10-10 ENCOUNTER — Encounter (HOSPITAL_COMMUNITY): Payer: Self-pay | Admitting: Psychology

## 2014-10-17 ENCOUNTER — Ambulatory Visit (INDEPENDENT_AMBULATORY_CARE_PROVIDER_SITE_OTHER): Payer: MEDICAID | Admitting: Psychiatry

## 2014-10-17 ENCOUNTER — Encounter (HOSPITAL_COMMUNITY): Payer: Self-pay | Admitting: Psychiatry

## 2014-10-17 VITALS — BP 127/76 | HR 67 | Ht 70.0 in | Wt 223.0 lb

## 2014-10-17 DIAGNOSIS — F319 Bipolar disorder, unspecified: Secondary | ICD-10-CM

## 2014-10-17 DIAGNOSIS — F3130 Bipolar disorder, current episode depressed, mild or moderate severity, unspecified: Secondary | ICD-10-CM

## 2014-10-17 MED ORDER — ALPRAZOLAM 1 MG PO TABS: 1.0000 mg | ORAL_TABLET | Freq: Two times a day (BID) | ORAL | Status: DC

## 2014-10-17 NOTE — Progress Notes (Signed)
Patient ID: Kita Neace, female   DOB: [birth year], 48 y.o.   MRN: [id number] Patient ID: Cyrena Kuchenbecker, female   DOB: 09/10/1966, 48 y.o.   MRN: [id number] Patient ID: Candiss Galeana, female   DOB: 1966/10/23, 48 y.o.   MRN: [id number] Patient ID: Jennilee Demarco, female   DOB: [birth year], 48 y.o.   MRN: [id number] Patient ID: Johnnay Pleitez, female   DOB: Dec 21, 1965, 48 y.o.   MRN: [id number] Patient ID: Mairi Stagliano, female   DOB: [birth year], 48 y.o.   MRN: [id number] Patient ID: Renad Jenniges, female   DOB: [birth year], 48 y.o.   MRN: [id number] Patient ID: Tikesha Mort, female   DOB: 1966/05/23, 48 y.o.   MRN: [id number] Patient ID: Denissa Cozart, female   DOB: [birth year], 48 y.o.   MRN: [id number] Patient ID: Sofhia Ulibarri, female   DOB: 04/29/1966, 48 y.o.   MRN: [id number] Patient ID: Lashaunda Schild, female   DOB: Feb 18, 1966, 48 y.o.   MRN: [id number] Patient ID: Storie Heffern, female   DOB: [birth year], 48 y.o.   MRN: [id number] [facility] [address] Progress Note Tylor Gambrill MRN: [id number] DOB: 05/22/1966 Age: 48 y.o.  Date: 10/17/2014 Start Time: 1:45 PM End Time: 2:10 PM  Chief Complaint: Chief Complaint  Patient presents with  . Depression  . Anxiety  . Follow-up   Subjective: This patient is a 48 year-old divorced white female who lives alone in [place]. She is on disability due to bipolar disorder.  The patient states that she became very depressed when her mother died in 02/19/00 of a brain aneurysm. She started getting. Sad but eventually became manic and develop mood swings and agitated behavior. She's been on numerous medications over the years. In 1988-02-19 she also had postpartum depression. For many years she was on lithium but it caused a bad taste in her mouth and her doctor was worried it might be starting to affect her renal function so was discontinued. She also thinks that it stopped working after a number of years.  The patient returns after 3 months. She is  doing better. She saw a nutritionist and she's trying to lose weight. She is walking more. She doesn't seem to get so upset with her family anymore. The only medication she is taking is Xanax but her primary doctor recently restarted Adderall to help her focus. She is planning to restart a GED program. Her mood has been generally good .  Marland Kitchen    Current Outpatient Prescriptions  Medication Sig Dispense Refill  . ALPRAZolam (XANAX) 1 MG tablet Take 1 tablet (1 mg total) by mouth 2 (two) times daily. 60 tablet 2  . amLODipine (NORVASC) 5 MG tablet Take 5 mg by mouth daily.    Marland Kitchen amphetamine-dextroamphetamine (ADDERALL) 30 MG tablet Take 30 mg by mouth 2 (two) times daily.    Marland Kitchen aspirin 81 MG tablet Take 81 mg by mouth daily.    . Cholecalciferol (VITAMIN D PO) Take 2,000 mg by mouth daily.    Marland Kitchen HYDROcodone-acetaminophen (NORCO) 10-325 MG per tablet Take 1 tablet by mouth 4 (four) times daily as needed.     Marland Kitchen levothyroxine (SYNTHROID, LEVOTHROID) 150 MCG tablet Take 150 mcg by mouth daily before breakfast.    . Multiple Vitamins-Minerals (MULTIVITAMIN WITH MINERALS) tablet Take 1 tablet by mouth daily.    . Omega-3 Fatty Acids (FISH OIL PO) Take 2 tablets by mouth daily.    Marland Kitchen rOPINIRole (REQUIP) 1 MG tablet Take 1 mg by mouth daily.    . vitamin  C (ASCORBIC ACID) 500 MG tablet Take 1,000 mg by mouth daily.      No current facility-administered medications for this visit.   Allergies: Allergies  Allergen Reactions  . Abilify [Aripiprazole] Other (See Comments)    Very violent  . Gabapentin Other (See Comments)    Nightmares and talking to her nightmares  . Thorazine [Chlorpromazine] Other (See Comments)    Shakes, anxiety off the roof and panic attack  . Seroquel [Quetiapine Fumarate]    Medical History: Past Medical History  Diagnosis Date  . DVT (deep venous thrombosis)   . Anxiety   . Bipolar disorder   . Obsessive-compulsive disorder   . Depression   . Thyroid disease   .  Headache(784.0)   . Hypothyroidism   . Hypertension    Surgical History: Past Surgical History  Procedure Laterality Date  . Spine surgery    . Cholecystectomy    . Back surgery     Family History: family history includes ADD / ADHD in her daughter; Anxiety disorder in her daughter; Bipolar disorder in her daughter, maternal aunt, and mother; Diabetes in her father; Drug abuse in her daughter and daughter; Heart disease in her brother, father, and mother; Hyperlipidemia in her brother, father, and mother; Hypertension in her brother, father, mother, and sister; Suicidality in her maternal aunt. There is no history of Alcohol abuse, Dementia, OCD, Paranoid behavior, Schizophrenia, Seizures, Sexual abuse, or Physical abuse. Reviewed and nothing new this visit.  Diagnosis:   Axis I: Bipolar, depressed Axis II: Deferred Axis III: Hypothyroidism, history of DVT  Axis IV: other psychosocial or environmental problems Axis V: 11  ADL's:  Intact  Sleep: Poor  Appetite:  Good  Suicidal Ideation:  Pt denies any thoughts, plans, intent of suicide Homicidal Ideation:  Pt denies any thoughts, plans, intent of homicide  AEB (as evidenced by):per pt report  Mental Status Examination/Evaluation: Objective:  Appearance: Casual  Eye Contact::  Good  Speech:  Clear and Coherent  Volume:  Normal  Mood:  Fairly good today   Affect:  Congruent  Thought Process:  Coherent  Orientation:  Full  Thought Content:  WDL  Suicidal Thoughts:  No  Homicidal Thoughts:  No  Memory:  Immediate;   Poor Recent;   Fair Remote;   Poor  Judgement:  Good  Insight:  Fair  Psychomotor Activity:  Normal  Concentration:  Poor  Recall:  Poor  Akathisia:  No  Handed:  Left  AIMS (if indicated):     Assets:  Communication Skills Desire for Improvement  Sleep:   too much   Vitals: BP 127/76 mmHg  Pulse 67  Ht 5\' 10"  (1.778 m)  Wt 223 lb (101.152 kg)  BMI 32.00 kg/m2  Lab Results:  Results for  orders placed or performed in visit on 03/08/14 (from the past 8736 hour(s))  Valproic acid level   Collection Time: 03/23/14  8:50 AM  Result Value Ref Range   Valproic Acid Lvl 61.2 50.0 - 100.0 ug/mL     Physical Findings: AIMS:  , ,  ,  ,    CIWA:    COWS:    Plan/Discussion: I took her vitals.  I reviewed CC, tobacco/med/surg Hx, meds effects/ side effects, problem list, therapies and responses as well as current situation/symptoms discussed options. She'llcontinue Xanax 1 mg twice a day as needed for anxiety. For now she wants to stay off all other medications and continue her counseling.Marland Kitchen She will return in 3 months  MEDICATIONS this encounter: Meds ordered this encounter  Medications  . Omega-3 Fatty Acids (FISH OIL PO)    Sig: Take 2 tablets by mouth daily.  . Cholecalciferol (VITAMIN D PO)    Sig: Take 2,000 mg by mouth daily.  Marland Kitchen amphetamine-dextroamphetamine (ADDERALL) 30 MG tablet    Sig: Take 30 mg by mouth 2 (two) times daily.  Marland Kitchen aspirin 81 MG tablet    Sig: Take 81 mg by mouth daily.  Marland Kitchen ALPRAZolam (XANAX) 1 MG tablet    Sig: Take 1 tablet (1 mg total) by mouth 2 (two) times daily.    Dispense:  60 tablet    Refill:  2    Medical Decision Making Problem Points:  Established problem, worsening (2), Review of last therapy session (1) and Review of psycho-social stressors (1) Data Points:  Review or order clinical lab tests (1) Review of medication regiment & side effects (2) Review of new medications or change in dosage (2)  I certify that outpatient services furnished can reasonably be expected to improve the patient's condition.   [name], MD

## 2014-10-19 ENCOUNTER — Ambulatory Visit: Payer: Self-pay | Admitting: Nutrition

## 2014-10-21 ENCOUNTER — Ambulatory Visit (HOSPITAL_COMMUNITY): Payer: Self-pay | Admitting: Psychiatry

## 2014-11-02 ENCOUNTER — Ambulatory Visit (HOSPITAL_COMMUNITY): Payer: Self-pay | Admitting: Psychology

## 2014-11-17 ENCOUNTER — Telehealth (HOSPITAL_COMMUNITY): Payer: Self-pay | Admitting: *Deleted

## 2014-11-18 ENCOUNTER — Ambulatory Visit (HOSPITAL_COMMUNITY): Payer: Self-pay | Admitting: Psychology

## 2014-11-21 ENCOUNTER — Ambulatory Visit (HOSPITAL_COMMUNITY): Payer: Self-pay | Admitting: Psychology

## 2014-12-14 ENCOUNTER — Ambulatory Visit (HOSPITAL_COMMUNITY): Payer: Self-pay | Admitting: Psychology

## 2015-01-13 ENCOUNTER — Ambulatory Visit (INDEPENDENT_AMBULATORY_CARE_PROVIDER_SITE_OTHER): Payer: MEDICAID | Admitting: Psychiatry

## 2015-01-13 ENCOUNTER — Encounter (HOSPITAL_COMMUNITY): Payer: Self-pay | Admitting: Psychiatry

## 2015-01-13 VITALS — BP 128/75 | HR 65 | Ht 70.0 in | Wt 220.0 lb

## 2015-01-13 DIAGNOSIS — F3130 Bipolar disorder, current episode depressed, mild or moderate severity, unspecified: Secondary | ICD-10-CM

## 2015-01-13 DIAGNOSIS — F310 Bipolar disorder, current episode hypomanic: Secondary | ICD-10-CM

## 2015-01-13 MED ORDER — ALPRAZOLAM 1 MG PO TABS: 1.0000 mg | ORAL_TABLET | Freq: Two times a day (BID) | ORAL | Status: DC

## 2015-01-13 MED ORDER — ESCITALOPRAM OXALATE 10 MG PO TABS: 10.0000 mg | ORAL_TABLET | Freq: Every day | ORAL | Status: DC

## 2015-01-13 NOTE — Progress Notes (Signed)
Patient ID: Jane [name], female   DOB: 10-19-1966, 49 y.o.   MRN: [id number] Patient ID: Jane [name], female   DOB: 16-Sep-1966, 49 y.o.   MRN: [id number] Patient ID: Jane [name], female   DOB: 1965/12/25, 49 y.o.   MRN: [id number] Patient ID: Jane [name], female   DOB: 1965/11/07, 49 y.o.   MRN: [id number] Patient ID: Jane [name], female   DOB: 1966/03/05, 49 y.o.   MRN: [id number] Patient ID: Jane [name], female   DOB: 12-20-1965, 49 y.o.   MRN: [id number] Patient ID: Jane [name], female   DOB: 1966-01-23, 49 y.o.   MRN: [id number] Patient ID: Jane [name], female   DOB: [birth year], 49 y.o.   MRN: [id number] Patient ID: Jane [name], female   DOB: 04-Feb-1966, 49 y.o.   MRN: [id number] Patient ID: Jane [name], female   DOB: 26-Nov-1965, 49 y.o.   MRN: [id number] Patient ID: Jane [name], female   DOB: [birth year], 49 y.o.   MRN: [id number] Patient ID: Jane [name], female   DOB: 10/14/1966, 50 y.o.   MRN: [id number] Patient ID: Jane [name], female   DOB: 12-17-1965, 49 y.o.   MRN: [id number] [facility] [address] Progress Note Jane [name] MRN: [name] DOB: 06-25-1966 Age: 49 y.o.  Date: 01/13/2015 Start Time: 1:45 PM End Time: 2:10 PM  Chief Complaint: Chief Complaint  Patient presents with  . Depression  . Anxiety  . Follow-up   Subjective: This patient is a 49 year-old divorced white female who lives alone in [place]. She is on disability due to bipolar disorder.  The patient states that she became very depressed when her mother died in 02/07/00 of a brain aneurysm. She started getting. Sad but eventually became manic and develop mood swings and agitated behavior. She's been on numerous medications over the years. In Feb 07, 1988 she also had postpartum depression. For many years she was on lithium but it caused a bad taste in her mouth and her doctor was worried it might be starting to affect her renal function so was discontinued. She also thinks that it stopped  working after a number of years.  The patient returns after 3 months. She is doing okay. She does report that she is more sad lately and has been crying more easily. She's not been on antidepressants for a while and I suggested we retry it. She does claim that she has some days where she feels "manic". I told her we could try a low-dose of Lexapro but if it precipitated more mania we would have to stop it. The Xanax continues to help her anxiety and she is still on Adderall from her primary physician. She continues to have conflicts with her kids. .  .    Current Outpatient Prescriptions  Medication Sig Dispense Refill  . ALPRAZolam (XANAX) 1 MG tablet Take 1 tablet (1 mg total) by mouth 2 (two) times daily. 60 tablet 2  . amLODipine (NORVASC) 5 MG tablet Take 5 mg by mouth daily.    Marland Kitchen amphetamine-dextroamphetamine (ADDERALL) 30 MG tablet Take 30 mg by mouth 2 (two) times daily.    Marland Kitchen aspirin 81 MG tablet Take 81 mg by mouth daily.    Marland Kitchen HYDROcodone-acetaminophen (NORCO) 10-325 MG per tablet Take 1 tablet by mouth 4 (four) times daily as needed.     Marland Kitchen levothyroxine (SYNTHROID, LEVOTHROID) 150 MCG tablet Take 150 mcg by mouth daily before breakfast.    . Multiple Vitamins-Minerals (MULTIVITAMIN WITH MINERALS) tablet Take 1 tablet by mouth daily.    . Omega-3  Fatty Acids (FISH OIL PO) Take 2 tablets by mouth daily.    Marland Kitchen rOPINIRole (REQUIP) 1 MG tablet Take 1 mg by mouth daily.    . Cholecalciferol (VITAMIN D PO) Take 2,000 mg by mouth daily.    Marland Kitchen escitalopram (LEXAPRO) 10 MG tablet Take 1 tablet (10 mg total) by mouth daily. 30 tablet 2  . vitamin C (ASCORBIC ACID) 500 MG tablet Take 1,000 mg by mouth daily.      No current facility-administered medications for this visit.   Allergies: Allergies  Allergen Reactions  . Abilify [Aripiprazole] Other (See Comments)    Very violent  . Gabapentin Other (See Comments)    Nightmares and talking to her nightmares  . Thorazine [Chlorpromazine] Other  (See Comments)    Shakes, anxiety off the roof and panic attack  . Seroquel [Quetiapine Fumarate]    Medical History: Past Medical History  Diagnosis Date  . DVT (deep venous thrombosis)   . Anxiety   . Bipolar disorder   . Obsessive-compulsive disorder   . Depression   . Thyroid disease   . Headache(784.0)   . Hypothyroidism   . Hypertension    Surgical History: Past Surgical History  Procedure Laterality Date  . Spine surgery    . Cholecystectomy    . Back surgery     Family History: family history includes ADD / ADHD in her daughter; Anxiety disorder in her daughter; Bipolar disorder in her daughter, maternal aunt, and mother; Diabetes in her father; Drug abuse in her daughter and daughter; Heart disease in her brother, father, and mother; Hyperlipidemia in her brother, father, and mother; Hypertension in her brother, father, mother, and sister; Suicidality in her maternal aunt. There is no history of Alcohol abuse, Dementia, OCD, Paranoid behavior, Schizophrenia, Seizures, Sexual abuse, or Physical abuse. Reviewed and nothing new this visit.  Diagnosis:   Axis I: Bipolar, depressed Axis II: Deferred Axis III: Hypothyroidism, history of DVT  Axis IV: other psychosocial or environmental problems Axis V: 83  ADL's:  Intact  Sleep: Poor  Appetite:  Good  Suicidal Ideation:  Pt denies any thoughts, plans, intent of suicide Homicidal Ideation:  Pt denies any thoughts, plans, intent of homicide  AEB (as evidenced by):per pt report  Mental Status Examination/Evaluation: Objective:  Appearance: Casual  Eye Contact::  Good  Speech:  Clear and Coherent  Volume:  Normal  Mood:  Fairly good today but claims she cries more easily   Affect:  Congruent  Thought Process:  Coherent  Orientation:  Full  Thought Content:  WDL  Suicidal Thoughts:  No  Homicidal Thoughts:  No  Memory:  Immediate;   Poor Recent;   Fair Remote;   Poor  Judgement:  Good  Insight:  Fair   Psychomotor Activity:  Normal  Concentration:  Poor  Recall:  Poor  Akathisia:  No  Handed:  Left  AIMS (if indicated):     Assets:  Communication Skills Desire for Improvement  Sleep:   too much   Vitals: BP 128/75 mmHg  Pulse 65  Ht 5\' 10"  (1.778 m)  Wt 220 lb (99.791 kg)  BMI 31.57 kg/m2  Lab Results:  Results for orders placed or performed in visit on 03/08/14 (from the past 8736 hour(s))  Valproic acid level   Collection Time: 03/23/14  8:50 AM  Result Value Ref Range   Valproic Acid Lvl 61.2 50.0 - 100.0 ug/mL     Physical Findings: AIMS:  , ,  ,  ,  CIWA:    COWS:    Plan/Discussion: I took her vitals.  I reviewed CC, tobacco/med/surg Hx, meds effects/ side effects, problem list, therapies and responses as well as current situation/symptoms discussed options. She'llcontinue Xanax 1 mg twice a day as needed for anxiety. We'll add Lexapro 10 mg daily For now she wants to stay off all other medications and continue her counseling.Marland Kitchen She will return in 4 weeks  MEDICATIONS this encounter: Meds ordered this encounter  Medications  . escitalopram (LEXAPRO) 10 MG tablet    Sig: Take 1 tablet (10 mg total) by mouth daily.    Dispense:  30 tablet    Refill:  2  . ALPRAZolam (XANAX) 1 MG tablet    Sig: Take 1 tablet (1 mg total) by mouth 2 (two) times daily.    Dispense:  60 tablet    Refill:  2    Medical Decision Making Problem Points:  Established problem, worsening (2), Review of last therapy session (1) and Review of psycho-social stressors (1) Data Points:  Review or order clinical lab tests (1) Review of medication regiment & side effects (2) Review of new medications or change in dosage (2)  I certify that outpatient services furnished can reasonably be expected to improve the patient's condition.   [name], MD

## 2015-01-16 ENCOUNTER — Ambulatory Visit (HOSPITAL_COMMUNITY): Payer: Self-pay | Admitting: Psychiatry

## 2015-01-16 ENCOUNTER — Ambulatory Visit (INDEPENDENT_AMBULATORY_CARE_PROVIDER_SITE_OTHER): Payer: MEDICAID | Admitting: Psychology

## 2015-01-16 ENCOUNTER — Encounter (HOSPITAL_COMMUNITY): Payer: Self-pay | Admitting: Psychology

## 2015-01-16 DIAGNOSIS — F429 Obsessive-compulsive disorder, unspecified: Secondary | ICD-10-CM

## 2015-01-16 DIAGNOSIS — F42 Obsessive-compulsive disorder: Secondary | ICD-10-CM

## 2015-01-16 DIAGNOSIS — F310 Bipolar disorder, current episode hypomanic: Secondary | ICD-10-CM

## 2015-01-16 NOTE — Progress Notes (Signed)
    PROGRESS NOTE Patient:  Jane [name]   DOB: 1966-06-04  MR Number: [id number]  Location: [facility] [address] [place] [address] Dept: [phone]  Start: 10 AM End: 11 AM  Provider/Observer:     Edgardo Roys PSYD  Chief Complaint:      Chief Complaint  Patient presents with  . Agitation  . Anxiety  . Stress  . Depression    Reason For Service:     the patient was referred because of issues associated with bipolar disorder and has been displaying manic episodes as well as symptoms of depression and social isolation.  The patient also has significant obsessive-compulsive disorder symptoms and other anxiety symptoms.  The patient has displayed poor coping skills, further exacerbated by her overall poor functioning.  Interventions Strategy:  Cognitive/behavioral psychotherapeutic interventions  Participation Level:   Active  Participation Quality:  Appropriate      Behavioral Observation:  Well Groomed, Alert, and Blunt.   Current Psychosocial Factors: The patient reports that she has been doing better with two of her daughters and infact they are planning a beach trip so patient can spend time with grandkids.  This is a big change and the patient is feeling better about this but worries that if something goes bad on trip that it will set back the growth in relationship.  Content of Session:   Review current symptoms and continued to work on therapeutic interventions for maintaining stable mood.  Current Status:   The patient reports that she continues to be making efforts at building coping skills and has been working on making better long term decisions.  She rpeort that she has had more depression lately but improvements in her overall life has helped.  She has gotten some relief of pain due to injections in her back and is looking at having surgery on her back.  Patient  Progress:   She reports that she had a very difficult last week with major stressors  including a major stressor of an abnormal signal found on her mammogram. A biopsy was conducted and it was just found to be fatty tissue and not a tumor.  Target Goals:   Target goals include maintaining mood stability, maintaining the ability to live independently, and working on reducing the frequency, intensity, and duration of panic attacks.  Last Reviewed:   01/16/2015  Goals Addressed Today:    Today we worked on issues related to mood stability.  Impression/Diagnosis:   the patient has a long-standing history of bipolar disorder.  However, psychosocial issues and difficulty coping with a wide range of situations further impair our ability to function effectively in two key per psychiatric symptoms in check.  Diagnosis:    Axis I:  Bipolar disorder, current episode hypomanic  OCD (obsessive compulsive disorder)      Axis II: No diagnosis      Lithzy Bernard R, PsyD 01/16/2015

## 2015-02-10 ENCOUNTER — Ambulatory Visit (HOSPITAL_COMMUNITY): Payer: Self-pay | Admitting: Psychiatry

## 2015-02-15 ENCOUNTER — Ambulatory Visit (INDEPENDENT_AMBULATORY_CARE_PROVIDER_SITE_OTHER): Payer: MEDICAID | Admitting: Psychiatry

## 2015-02-15 ENCOUNTER — Encounter (HOSPITAL_COMMUNITY): Payer: Self-pay | Admitting: Psychiatry

## 2015-02-15 VITALS — BP 118/69 | HR 57 | Ht 70.0 in | Wt 223.8 lb

## 2015-02-15 DIAGNOSIS — F3130 Bipolar disorder, current episode depressed, mild or moderate severity, unspecified: Secondary | ICD-10-CM | POA: Diagnosis not present

## 2015-02-15 DIAGNOSIS — F310 Bipolar disorder, current episode hypomanic: Secondary | ICD-10-CM

## 2015-02-15 MED ORDER — ESCITALOPRAM OXALATE 10 MG PO TABS: 10.0000 mg | ORAL_TABLET | Freq: Every day | ORAL | Status: DC

## 2015-02-15 NOTE — Progress Notes (Signed)
Patient ID: Jane [name], female   DOB: [birth year], 49 y.o.   MRN: [id number] Patient ID: Jane [name], female   DOB: 1966/08/15, 49 y.o.   MRN: [id number] Patient ID: Jane [name], female   DOB: Aug 04, 1966, 49 y.o.   MRN: [id number] Patient ID: Jane [name], female   DOB: [birth year], 49 y.o.   MRN: [id number] Patient ID: Jane [name], female   DOB: 03-18-1966, 49 y.o.   MRN: [id number] Patient ID: Jane [name], female   DOB: 1966/07/02, 49 y.o.   MRN: [id number] Patient ID: Jane [name], female   DOB: 10/23/1966, 49 y.o.   MRN: [id number] Patient ID: Jane [name], female   DOB: Oct 05, 1966, 49 y.o.   MRN: [id number] Patient ID: Jane [name], female   DOB: [birth year], 49 y.o.   MRN: [id number] Patient ID: Jane [name], female   DOB: 1966-11-02, 49 y.o.   MRN: [id number] Patient ID: Jane [name], female   DOB: 03/09/1966, 49 y.o.   MRN: [id number] Patient ID: Jane [name], female   DOB: [birth year], 49 y.o.   MRN: [id number] Patient ID: Jane [name], female   DOB: 09-03-1966, 49 y.o.   MRN: [id number] Patient ID: Jane [name], female   DOB: 1966/05/20, 49 y.o.   MRN: [id number] [facility] [address] Progress Note Jane [name] MRN: [id number] DOB: 18-May-1966 Age: 49 y.o.  Date: 02/15/2015 Start Time: 1:45 PM End Time: 2:10 PM  Chief Complaint: Chief Complaint  Patient presents with  . Depression  . Anxiety  . Follow-up   Subjective: This patient is a 49 year-old divorced white female who lives alone in [place]. She is on disability due to bipolar disorder.  The patient states that she became very depressed when her mother died in 09-Feb-2000 of a brain aneurysm. She started getting. Sad but eventually became manic and develop mood swings and agitated behavior. She's been on numerous medications over the years. In 09-Feb-1988 she also had postpartum depression. For many years she was on lithium but it caused a bad taste in her mouth and her doctor was worried it might be starting to  affect her renal function so was discontinued. She also thinks that it stopped working after a number of years.  The patient returns after 3 months. She is doing well. The addition of Lexapro is really helped her mood. The Xanax helps her anxiety. She still having conflicts with her youngest daughter but did go on a beach trip with her and her older daughter and their children and seemed to enjoy it. She is sleeping well and walking frequently and she states that her life is more stable than it has ever been .  Marland Kitchen    Current Outpatient Prescriptions  Medication Sig Dispense Refill  . ALPRAZolam (XANAX) 1 MG tablet Take 1 tablet (1 mg total) by mouth 2 (two) times daily. 60 tablet 2  . amLODipine (NORVASC) 5 MG tablet Take 5 mg by mouth daily.    Marland Kitchen amphetamine-dextroamphetamine (ADDERALL) 30 MG tablet Take 30 mg by mouth 2 (two) times daily.    Marland Kitchen aspirin 81 MG tablet Take 81 mg by mouth daily.    . Chlorpheniramine Maleate (ALLERGY RELIEF PO) Take by mouth daily.    Marland Kitchen escitalopram (LEXAPRO) 10 MG tablet Take 1 tablet (10 mg total) by mouth daily. 30 tablet 2  . hydrochlorothiazide (HYDRODIURIL) 25 MG tablet Take 25 mg by mouth daily.    Marland Kitchen HYDROcodone-acetaminophen (NORCO) 10-325 MG per tablet Take 1 tablet by mouth 4 (four) times daily as needed.     Marland Kitchen  levothyroxine (SYNTHROID, LEVOTHROID) 150 MCG tablet Take 150 mcg by mouth daily before breakfast.    . Multiple Vitamins-Minerals (MULTIVITAMIN WITH MINERALS) tablet Take 1 tablet by mouth daily.    . Omega-3 Fatty Acids (FISH OIL PO) Take 2 tablets by mouth daily.    . Probiotic Product (PROBIOTIC DAILY PO) Take by mouth daily.    Marland Kitchen rOPINIRole (REQUIP) 1 MG tablet Take 1 mg by mouth daily.    . Cholecalciferol (VITAMIN D PO) Take 2,000 mg by mouth daily.    . vitamin C (ASCORBIC ACID) 500 MG tablet Take 1,000 mg by mouth daily.      No current facility-administered medications for this visit.   Allergies: Allergies  Allergen Reactions  .  Abilify [Aripiprazole] Other (See Comments)    Very violent  . Gabapentin Other (See Comments)    Nightmares and talking to her nightmares  . Thorazine [Chlorpromazine] Other (See Comments)    Shakes, anxiety off the roof and panic attack  . Seroquel [Quetiapine Fumarate]    Medical History: Past Medical History  Diagnosis Date  . DVT (deep venous thrombosis)   . Anxiety   . Bipolar disorder   . Obsessive-compulsive disorder   . Depression   . Thyroid disease   . Headache(784.0)   . Hypothyroidism   . Hypertension    Surgical History: Past Surgical History  Procedure Laterality Date  . Spine surgery    . Cholecystectomy    . Back surgery     Family History: family history includes ADD / ADHD in her daughter; Anxiety disorder in her daughter; Bipolar disorder in her daughter, maternal aunt, and mother; Diabetes in her father; Drug abuse in her daughter and daughter; Heart disease in her brother, father, and mother; Hyperlipidemia in her brother, father, and mother; Hypertension in her brother, father, mother, and sister; Suicidality in her maternal aunt. There is no history of Alcohol abuse, Dementia, OCD, Paranoid behavior, Schizophrenia, Seizures, Sexual abuse, or Physical abuse. Reviewed and nothing new this visit.  Diagnosis:   Axis I: Bipolar, depressed Axis II: Deferred Axis III: Hypothyroidism, history of DVT  Axis IV: other psychosocial or environmental problems Axis V: 80  ADL's:  Intact  Sleep: Poor  Appetite:  Good  Suicidal Ideation:  Pt denies any thoughts, plans, intent of suicide Homicidal Ideation:  Pt denies any thoughts, plans, intent of homicide  AEB (as evidenced by):per pt report  Mental Status Examination/Evaluation: Objective:  Appearance: Casual  Eye Contact::  Good  Speech:  Clear and Coherent  Volume:  Normal  Mood:  Good   Affect:  Congruent  Thought Process:  Coherent  Orientation:  Full  Thought Content:  WDL  Suicidal  Thoughts:  No  Homicidal Thoughts:  No  Memory:  Immediate;   Poor Recent;   Fair Remote;   Poor  Judgement:  Good  Insight:  Fair  Psychomotor Activity:  Normal  Concentration:  Poor  Recall:  Poor  Akathisia:  No  Handed:  Left  AIMS (if indicated):     Assets:  Communication Skills Desire for Improvement  Sleep:   too much   Vitals: BP 118/69 mmHg  Pulse 57  Ht 5\' 10"  (1.778 m)  Wt 223 lb 12.8 oz (101.515 kg)  BMI 32.11 kg/m2  Lab Results:  Results for orders placed or performed in visit on 03/08/14 (from the past 8736 hour(s))  Valproic acid level   Collection Time: 03/23/14  8:50 AM  Result Value Ref Range  Valproic Acid Lvl 61.2 50.0 - 100.0 ug/mL     Physical Findings: AIMS:  , ,  ,  ,    CIWA:    COWS:    Plan/Discussion: I took her vitals.  I reviewed CC, tobacco/med/surg Hx, meds effects/ side effects, problem list, therapies and responses as well as current situation/symptoms discussed options. She'llcontinue Xanax 1 mg twice a day as needed for anxiety and Lexapro 10 mg daily For now she wants to stay off all other medications and continue her counseling.Marland Kitchen She will return in 3 months  MEDICATIONS this encounter: Meds ordered this encounter  Medications  . hydrochlorothiazide (HYDRODIURIL) 25 MG tablet    Sig: Take 25 mg by mouth daily.  . Probiotic Product (PROBIOTIC DAILY PO)    Sig: Take by mouth daily.  . Chlorpheniramine Maleate (ALLERGY RELIEF PO)    Sig: Take by mouth daily.  Marland Kitchen escitalopram (LEXAPRO) 10 MG tablet    Sig: Take 1 tablet (10 mg total) by mouth daily.    Dispense:  30 tablet    Refill:  2    Medical Decision Making Problem Points:  Established problem, worsening (2), Review of last therapy session (1) and Review of psycho-social stressors (1) Data Points:  Review or order clinical lab tests (1) Review of medication regiment & side effects (2) Review of new medications or change in dosage (2)  I certify that outpatient  services furnished can reasonably be expected to improve the patient's condition.   [name], MD

## 2015-02-20 ENCOUNTER — Ambulatory Visit (INDEPENDENT_AMBULATORY_CARE_PROVIDER_SITE_OTHER): Payer: MEDICAID | Admitting: Psychology

## 2015-02-20 DIAGNOSIS — F429 Obsessive-compulsive disorder, unspecified: Secondary | ICD-10-CM

## 2015-02-20 DIAGNOSIS — F310 Bipolar disorder, current episode hypomanic: Secondary | ICD-10-CM | POA: Diagnosis not present

## 2015-02-20 DIAGNOSIS — F42 Obsessive-compulsive disorder: Secondary | ICD-10-CM

## 2015-03-06 ENCOUNTER — Other Ambulatory Visit (HOSPITAL_COMMUNITY): Payer: Self-pay | Admitting: Psychiatry

## 2015-03-14 ENCOUNTER — Other Ambulatory Visit (HOSPITAL_COMMUNITY): Payer: Self-pay | Admitting: Psychiatry

## 2015-03-22 ENCOUNTER — Ambulatory Visit (HOSPITAL_COMMUNITY): Payer: Self-pay | Admitting: Psychology

## 2015-04-12 ENCOUNTER — Telehealth (HOSPITAL_COMMUNITY): Payer: Self-pay | Admitting: *Deleted

## 2015-04-12 ENCOUNTER — Encounter (HOSPITAL_COMMUNITY): Payer: Self-pay | Admitting: Psychology

## 2015-04-18 ENCOUNTER — Ambulatory Visit (HOSPITAL_COMMUNITY): Payer: Self-pay | Admitting: Psychology

## 2015-04-26 ENCOUNTER — Encounter (HOSPITAL_COMMUNITY): Payer: Self-pay | Admitting: Psychology

## 2015-04-26 NOTE — Progress Notes (Signed)
    PROGRESS NOTE Patient:  Jane [name]   DOB: 1966/03/08  MR Number: [id number]  Location: [facility] [address] [place] [address] Dept: [phone]  Start: 11 AM End: 12 PM  Provider/Observer:     Edgardo Roys PSYD  Chief Complaint:      Chief Complaint  Patient presents with  . Agitation  . Anxiety  . Depression    Reason For Service:     the patient was referred because of issues associated with bipolar disorder and has been displaying manic episodes as well as symptoms of depression and social isolation.  The patient also has significant obsessive-compulsive disorder symptoms and other anxiety symptoms.  The patient has displayed poor coping skills, further exacerbated by her overall poor functioning.  Interventions Strategy:  Cognitive/behavioral psychotherapeutic interventions  Participation Level:   Active  Participation Quality:  Appropriate      Behavioral Observation:  Well Groomed, Alert, and Blunt.   Current Psychosocial Factors: The patient reports that she has been interacting with her family and overall it is been going fairly well. The patient reports that she is having a lot of stress associated with family interactions. The patient reports that she has been working on trying to improve this overall healthy..  Content of Session:   Review current symptoms and continued to work on therapeutic interventions for maintaining stable mood.  Current Status:   The patient reports that she continues to be making efforts at building coping skills and has been working on making better long term decisions.  She rpeort that she has had more depression lately but improvements in her overall life has helped.  She has gotten some relief of pain due to injections in her back and is looking at having surgery on her back.  Patient Progress:   She reports that she had a very  difficult last week with major stressors  including a major stressor of an abnormal signal found on her mammogram. A biopsy was conducted and it was just found to be fatty tissue and not a tumor.  Target Goals:   Target goals include maintaining mood stability, maintaining the ability to live independently, and working on reducing the frequency, intensity, and duration of panic attacks.  Last Reviewed:   02/20/2015  Goals Addressed Today:    Today we worked on issues related to mood stability.  Impression/Diagnosis:   the patient has a long-standing history of bipolar disorder.  However, psychosocial issues and difficulty coping with a wide range of situations further impair our ability to function effectively in two key per psychiatric symptoms in check.  Diagnosis:    Axis I:  Bipolar disorder, current episode hypomanic  OCD (obsessive compulsive disorder)      Axis II: No diagnosis      Katiria Calame R, PsyD 04/26/2015

## 2015-05-03 ENCOUNTER — Ambulatory Visit (HOSPITAL_COMMUNITY): Payer: Self-pay | Admitting: Psychology

## 2015-05-17 ENCOUNTER — Encounter (HOSPITAL_COMMUNITY): Payer: Self-pay | Admitting: Psychiatry

## 2015-05-17 ENCOUNTER — Ambulatory Visit (INDEPENDENT_AMBULATORY_CARE_PROVIDER_SITE_OTHER): Payer: Medicaid Other | Admitting: Psychiatry

## 2015-05-17 VITALS — BP 149/83 | HR 64 | Ht 68.82 in | Wt 224.4 lb

## 2015-05-17 DIAGNOSIS — F3130 Bipolar disorder, current episode depressed, mild or moderate severity, unspecified: Secondary | ICD-10-CM | POA: Diagnosis not present

## 2015-05-17 DIAGNOSIS — F331 Major depressive disorder, recurrent, moderate: Secondary | ICD-10-CM

## 2015-05-17 DIAGNOSIS — F310 Bipolar disorder, current episode hypomanic: Secondary | ICD-10-CM

## 2015-05-17 MED ORDER — ESCITALOPRAM OXALATE 20 MG PO TABS: 20.0000 mg | ORAL_TABLET | Freq: Every day | ORAL | Status: DC

## 2015-05-17 MED ORDER — ALPRAZOLAM 1 MG PO TABS: 1.0000 mg | ORAL_TABLET | Freq: Two times a day (BID) | ORAL | Status: DC

## 2015-05-17 MED ORDER — AMPHETAMINE-DEXTROAMPHETAMINE 30 MG PO TABS: 30.0000 mg | ORAL_TABLET | Freq: Two times a day (BID) | ORAL | Status: DC

## 2015-05-17 NOTE — Progress Notes (Signed)
Patient ID: Jane [name], female   DOB: Jul 18, 1966, 49 y.o.   MRN: [id number] Patient ID: Jane [name], female   DOB: [birth year], 49 y.o.   MRN: [id number] Patient ID: Jane [name], female   DOB: Nov 29, 1965, 49 y.o.   MRN: [id number] Patient ID: Jane [name], female   DOB: Sep 22, 1966, 49 y.o.   MRN: [id number] Patient ID: Jane [name], female   DOB: 29-Aug-1966, 49 y.o.   MRN: [id number] Patient ID: Jane [name], female   DOB: [birth year], 49 y.o.   MRN: [id number] Patient ID: Jane [name], female   DOB: Oct 24, 1966, 49 y.o.   MRN: [id number] Patient ID: Jane [name], female   DOB: 1966-07-02, 50 y.o.   MRN: [id number] Patient ID: Jane [name], female   DOB: 14-May-1966, 49 y.o.   MRN: [id number] Patient ID: Jane [name], female   DOB: [birth year], 49 y.o.   MRN: [id number] Patient ID: Jane [name], female   DOB: 1966/04/25, 49 y.o.   MRN: [id number] Patient ID: Jane [name], female   DOB: 02/11/1966, 49 y.o.   MRN: [id number] Patient ID: Jane [name], female   DOB: [birth year], 49 y.o.   MRN: [id number] Patient ID: Jane [name], female   DOB: Mar 26, 1966, 49 y.o.   MRN: [id number] Patient ID: Jane [name], female   DOB: 17-Mar-1966, 49 y.o.   MRN: [id number] [facility] [address] Progress Note Jane [name] MRN: [id number] DOB: [birth year] Age: 49 y.o.  Date: 05/17/2015 Start Time: 1:45 PM End Time: 2:10 PM  Chief Complaint: Chief Complaint  Patient presents with  . Depression  . Anxiety  . Follow-up   Subjective: This patient is a 49 year-old divorced white female who lives alone in [place]. She is on disability due to bipolar disorder.  The patient states that she became very depressed when her mother died in 02-11-2000 of a brain aneurysm. She started getting. Sad but eventually became manic and develop mood swings and agitated behavior. She's been on numerous medications over the years. In 02-11-88 she also had postpartum depression. For many years she was on lithium but it  caused a bad taste in her mouth and her doctor was worried it might be starting to affect her renal function so was discontinued. She also thinks that it stopped working after a number of years.  The patient returns after 3 months. She has been more depressed recently. She is continuing to have conflicts with her daughters. Lately she's not felt like getting out of bed or doing much of anything. I suggested we increase her Lexapro and she agrees. She is not suicidal and has not had any manic symptoms. The Xanax continues to help her anxiety and Adderall helps her with focus. .  .    Current Outpatient Prescriptions  Medication Sig Dispense Refill  . ALPRAZolam (XANAX) 1 MG tablet Take 1 tablet (1 mg total) by mouth 2 (two) times daily. 60 tablet 2  . amLODipine (NORVASC) 5 MG tablet Take 5 mg by mouth daily.    Marland Kitchen amphetamine-dextroamphetamine (ADDERALL) 30 MG tablet Take 1 tablet by mouth 2 (two) times daily. 60 tablet 0  . amphetamine-dextroamphetamine (ADDERALL) 30 MG tablet Take 1 tablet by mouth 2 (two) times daily. 60 tablet 0  . aspirin 81 MG tablet Take 81 mg by mouth daily.    . Chlorpheniramine Maleate (ALLERGY RELIEF PO) Take by mouth daily.    . Cholecalciferol (VITAMIN D PO) Take 2,000 mg by mouth daily.    Marland Kitchen escitalopram (LEXAPRO) 20 MG tablet Take 1 tablet (20 mg  total) by mouth daily. 30 tablet 2  . hydrochlorothiazide (HYDRODIURIL) 25 MG tablet Take 25 mg by mouth daily.    Marland Kitchen HYDROcodone-acetaminophen (NORCO) 10-325 MG per tablet Take 1 tablet by mouth 4 (four) times daily as needed.     Marland Kitchen levothyroxine (SYNTHROID, LEVOTHROID) 150 MCG tablet Take 150 mcg by mouth daily before breakfast.    . Multiple Vitamins-Minerals (MULTIVITAMIN WITH MINERALS) tablet Take 1 tablet by mouth daily.    . Omega-3 Fatty Acids (FISH OIL PO) Take 2 tablets by mouth daily.    . Probiotic Product (PROBIOTIC DAILY PO) Take by mouth daily.    Marland Kitchen rOPINIRole (REQUIP) 1 MG tablet Take 1 mg by mouth daily.     . vitamin C (ASCORBIC ACID) 500 MG tablet Take 1,000 mg by mouth daily.      No current facility-administered medications for this visit.   Allergies: Allergies  Allergen Reactions  . Abilify [Aripiprazole] Other (See Comments)    Very violent  . Gabapentin Other (See Comments)    Nightmares and talking to her nightmares  . Thorazine [Chlorpromazine] Other (See Comments)    Shakes, anxiety off the roof and panic attack  . Seroquel [Quetiapine Fumarate]    Medical History: Past Medical History  Diagnosis Date  . DVT (deep venous thrombosis)   . Anxiety   . Bipolar disorder   . Obsessive-compulsive disorder   . Depression   . Thyroid disease   . Headache(784.0)   . Hypothyroidism   . Hypertension    Surgical History: Past Surgical History  Procedure Laterality Date  . Spine surgery    . Cholecystectomy    . Back surgery     Family History: family history includes ADD / ADHD in her daughter; Anxiety disorder in her daughter; Bipolar disorder in her daughter, maternal aunt, and mother; Diabetes in her father; Drug abuse in her daughter and daughter; Heart disease in her brother, father, and mother; Hyperlipidemia in her brother, father, and mother; Hypertension in her brother, father, mother, and sister; Suicidality in her maternal aunt. There is no history of Alcohol abuse, Dementia, OCD, Paranoid behavior, Schizophrenia, Seizures, Sexual abuse, or Physical abuse. Reviewed and nothing new this visit.  Diagnosis:   Axis I: Bipolar, depressed Axis II: Deferred Axis III: Hypothyroidism, history of DVT  Axis IV: other psychosocial or environmental problems Axis V: 73  ADL's:  Intact  Sleep: Poor  Appetite:  Good  Suicidal Ideation:  Pt denies any thoughts, plans, intent of suicide Homicidal Ideation:  Pt denies any thoughts, plans, intent of homicide  AEB (as evidenced by):per pt report  Mental Status Examination/Evaluation: Objective:  Appearance: Casual   Eye Contact::  Good  Speech:  Clear and Coherent  Volume:  Normal  Mood: Somewhat dysphoric   Affect:  Congruent  Thought Process:  Coherent  Orientation:  Full  Thought Content:  WDL  Suicidal Thoughts:  No  Homicidal Thoughts:  No  Memory:  Immediate;   Poor Recent;   Fair Remote;   Poor  Judgement:  Good  Insight:  Fair  Psychomotor Activity:  Normal  Concentration:  Poor  Recall:  Poor  Akathisia:  No  Handed:  Left  AIMS (if indicated):     Assets:  Communication Skills Desire for Improvement  Sleep:   too much   Vitals: BP 149/83 mmHg  Pulse 64  Ht 5' 8.82" (1.748 m)  Wt 224 lb 6.4 oz (101.787 kg)  BMI 33.31 kg/m2  Lab Results:  No results found for this or any previous visit (from the past 8736 hour(s)).   Physical Findings: AIMS:  , ,  ,  ,    CIWA:    COWS:    Plan/Discussion: I took her vitals.  I reviewed CC, tobacco/med/surg Hx, meds effects/ side effects, problem list, therapies and responses as well as current situation/symptoms discussed options. She'llcontinue Xanax 1 mg twice a day as needed for anxiety and Lexapro will be increased to 20 mg daily for depression. She continues Adderall for ADHD through her family physician. She'll return in 2 months but call me if her depression worsens  MEDICATIONS this encounter: Meds ordered this encounter  Medications  . escitalopram (LEXAPRO) 20 MG tablet    Sig: Take 1 tablet (20 mg total) by mouth daily.    Dispense:  30 tablet    Refill:  2  . ALPRAZolam (XANAX) 1 MG tablet    Sig: Take 1 tablet (1 mg total) by mouth 2 (two) times daily.    Dispense:  60 tablet    Refill:  2  . amphetamine-dextroamphetamine (ADDERALL) 30 MG tablet    Sig: Take 1 tablet by mouth 2 (two) times daily.    Dispense:  60 tablet    Refill:  0  . amphetamine-dextroamphetamine (ADDERALL) 30 MG tablet    Sig: Take 1 tablet by mouth 2 (two) times daily.    Dispense:  60 tablet    Refill:  0    Fill after 06/17/15     Medical Decision Making Problem Points:  Established problem, worsening (2), Review of last therapy session (1) and Review of psycho-social stressors (1) Data Points:  Review or order clinical lab tests (1) Review of medication regiment & side effects (2) Review of new medications or change in dosage (2)  I certify that outpatient services furnished can reasonably be expected to improve the patient's condition.   [name], MD

## 2015-05-24 ENCOUNTER — Ambulatory Visit (HOSPITAL_COMMUNITY): Payer: Self-pay | Admitting: Psychology

## 2015-07-03 ENCOUNTER — Ambulatory Visit (INDEPENDENT_AMBULATORY_CARE_PROVIDER_SITE_OTHER): Payer: Medicaid Other | Admitting: Psychology

## 2015-07-03 DIAGNOSIS — F331 Major depressive disorder, recurrent, moderate: Secondary | ICD-10-CM | POA: Diagnosis not present

## 2015-07-03 DIAGNOSIS — F310 Bipolar disorder, current episode hypomanic: Secondary | ICD-10-CM | POA: Diagnosis not present

## 2015-07-04 ENCOUNTER — Encounter (HOSPITAL_COMMUNITY): Payer: Self-pay | Admitting: Psychology

## 2015-07-04 NOTE — Progress Notes (Signed)
    PROGRESS NOTE Patient:  Jane [name]   DOB: February 11, 1966  MR Number: [id number]  Location: [facility] [address] [place] [address] Dept: [phone]  Start: 11 AM End: 12 PM  Provider/Observer:     Edgardo Roys PSYD  Chief Complaint:      Chief Complaint  Patient presents with  . Anxiety  . Agitation  . Stress  . Depression    Reason For Service:     the patient was referred because of issues associated with bipolar disorder and has been displaying manic episodes as well as symptoms of depression and social isolation.  The patient also has significant obsessive-compulsive disorder symptoms and other anxiety symptoms.  The patient has displayed poor coping skills, further exacerbated by her overall poor functioning.  Interventions Strategy:  Cognitive/behavioral psychotherapeutic interventions  Participation Level:   Active  Participation Quality:  Appropriate      Behavioral Observation:  Well Groomed, Alert, and Blunt.   Current Psychosocial Factors: The patient reports that she has been interacting with her family and overall it is been going fairly well. The patient reports that she is having a lot of stress associated with family interactions. The patient reports that she has been working on trying to improve this overall healthy..  Content of Session:   Review current symptoms and continued to work on therapeutic interventions for maintaining stable mood.  Current Status:   The patient reports that she continues to be making efforts at building coping skills and has been working on making better long term decisions.  She rpeort that she has had more depression lately but improvements in her overall life has helped.  She has gotten some relief of pain due to injections in her back and is looking at having surgery on her back.  Patient Progress:   She reports that she  had a very difficult last week with major stressors  including a major stressor of an abnormal signal found on her mammogram. A biopsy was conducted and it was just found to be fatty tissue and not a tumor.  Target Goals:   Target goals include maintaining mood stability, maintaining the ability to live independently, and working on reducing the frequency, intensity, and duration of panic attacks.  Last Reviewed:   07/03/2015  Goals Addressed Today:    Today we worked on issues related to mood stability.  Impression/Diagnosis:   the patient has a long-standing history of bipolar disorder.  However, psychosocial issues and difficulty coping with a wide range of situations further impair our ability to function effectively in two key per psychiatric symptoms in check.  Diagnosis:    Axis I:  Bipolar disorder, current episode hypomanic  Major depressive disorder, recurrent episode, moderate      Axis II: No diagnosis      RODENBOUGH,JOHN R, PsyD 07/04/2015

## 2015-07-05 ENCOUNTER — Other Ambulatory Visit: Payer: Self-pay | Admitting: Gastroenterology

## 2015-07-06 NOTE — Telephone Encounter (Signed)
I don't see where we have ever seen this patient. Please have pharmacy route RX to appropriate provider.

## 2015-07-19 ENCOUNTER — Ambulatory Visit (INDEPENDENT_AMBULATORY_CARE_PROVIDER_SITE_OTHER): Payer: Medicaid Other | Admitting: Psychiatry

## 2015-07-19 ENCOUNTER — Encounter (HOSPITAL_COMMUNITY): Payer: Self-pay | Admitting: Psychiatry

## 2015-07-19 VITALS — BP 112/65 | HR 72 | Ht 68.82 in | Wt 228.0 lb

## 2015-07-19 DIAGNOSIS — F310 Bipolar disorder, current episode hypomanic: Secondary | ICD-10-CM | POA: Diagnosis not present

## 2015-07-19 MED ORDER — AMPHETAMINE-DEXTROAMPHETAMINE 30 MG PO TABS: 30.0000 mg | ORAL_TABLET | Freq: Two times a day (BID) | ORAL | Status: DC

## 2015-07-19 MED ORDER — ESCITALOPRAM OXALATE 20 MG PO TABS: 20.0000 mg | ORAL_TABLET | Freq: Every day | ORAL | Status: DC

## 2015-07-19 MED ORDER — ALPRAZOLAM 1 MG PO TABS: 1.0000 mg | ORAL_TABLET | Freq: Two times a day (BID) | ORAL | Status: DC

## 2015-07-19 NOTE — Progress Notes (Signed)
Patient ID: Jane [name], female   DOB: 01-07-1966, 49 y.o.   MRN: [id number] Patient ID: Jane [name], female   DOB: 07/30/1966, 49 y.o.   MRN: [id number] Patient ID: Jane [name], female   DOB: 1965/11/06, 49 y.o.   MRN: [id number] Patient ID: Jane [name], female   DOB: 1966-02-22, 49 y.o.   MRN: [id number] Patient ID: Jane [name], female   DOB: December 07, 1965, 49 y.o.   MRN: [id number] Patient ID: Jane [name], female   DOB: 1966-09-28, 49 y.o.   MRN: [id number] Patient ID: Jane [name], female   DOB: 1966/05/30, 49 y.o.   MRN: [id number] Patient ID: Jane [name], female   DOB: 05/30/1966, 49 y.o.   MRN: [id number] Patient ID: Jane [name], female   DOB: 16-Jun-1966, 49 y.o.   MRN: [id number] Patient ID: Jane [name], female   DOB: 1966/01/01, 49 y.o.   MRN: [id number] Patient ID: Jane [name], female   DOB: [birth year], 49 y.o.   MRN: [id number] Patient ID: Jane [name], female   DOB: 1966-10-08, 49 y.o.   MRN: [id number] Patient ID: Jane [name], female   DOB: 30-Nov-1965, 49 y.o.   MRN: [id number] Patient ID: Jane [name], female   DOB: 05-Oct-1966, 49 y.o.   MRN: [id number] Patient ID: Jane [name], female   DOB: Feb 22, 1966, 49 y.o.   MRN: [id number] Patient ID: Jane [name], female   DOB: 1966-05-13, 49 y.o.   MRN: [id number] [facility] [address] Progress Note Jane [name] MRN: [id number] DOB: 10/09/1966 Age: 49 y.o.  Date: 07/19/2015 Start Time: 1:45 PM End Time: 2:10 PM  Chief Complaint: Chief Complaint  Patient presents with  . Depression  . ADD  . Manic Behavior  . Follow-up   Subjective: This patient is a 49 year-old divorced white female who lives alone in [place]. She is on disability due to bipolar disorder.  The patient states that she became very depressed when her mother died in 02-29-00 of a brain aneurysm. She started getting. Sad but eventually became manic and develop mood swings and agitated behavior. She's been on numerous medications over the  years. In 02/29/1988 she also had postpartum depression. For many years she was on lithium but it caused a bad taste in her mouth and her doctor was worried it might be starting to affect her renal function so was discontinued. She also thinks that it stopped working after a number of years.  The patient returns after 2 months. She denies being depressed but just feels tired all the time and no motivation. Her youngest grandson is now in school so she doesn't have "anyone to pal around with." She's not sure how to spend her time through a day. Both her therapist and I suggested she look into getting part-time work and going to vocational rehabilitation for help with this or going back to school. Right now she does not have a vehicle but she could take a bus into the town of [place]. She denies suicidal ideation for the most part is functioning fairly well. She recently had a physical exam and everything was normal including her thyroid tests. She does feel the Adderall helps her with focus and Xanax with anxiety.  .    Current Outpatient Prescriptions  Medication Sig Dispense Refill  . ALPRAZolam (XANAX) 1 MG tablet Take 1 tablet (1 mg total) by mouth 2 (two) times daily. 60 tablet 2  . amLODipine (NORVASC) 5 MG tablet Take 5 mg by mouth daily.    Marland Kitchen amphetamine-dextroamphetamine (ADDERALL) 30 MG tablet Take 1 tablet  by mouth 2 (two) times daily. 60 tablet 0  . amphetamine-dextroamphetamine (ADDERALL) 30 MG tablet Take 1 tablet by mouth 2 (two) times daily. 60 tablet 0  . aspirin 81 MG tablet Take 81 mg by mouth daily.    . Chlorpheniramine Maleate (ALLERGY RELIEF PO) Take by mouth daily.    . Cholecalciferol (VITAMIN D PO) Take 2,000 mg by mouth daily.    Marland Kitchen escitalopram (LEXAPRO) 20 MG tablet Take 1 tablet (20 mg total) by mouth daily. 30 tablet 2  . hydrochlorothiazide (HYDRODIURIL) 25 MG tablet Take 25 mg by mouth daily.    Marland Kitchen HYDROcodone-acetaminophen (NORCO) 10-325 MG per tablet Take 1 tablet by mouth 4  (four) times daily as needed.     Marland Kitchen levothyroxine (SYNTHROID, LEVOTHROID) 150 MCG tablet Take 150 mcg by mouth daily before breakfast.    . Multiple Vitamins-Minerals (MULTIVITAMIN WITH MINERALS) tablet Take 1 tablet by mouth daily.    . Omega-3 Fatty Acids (FISH OIL PO) Take 2 tablets by mouth daily.    . Probiotic Product (PROBIOTIC DAILY PO) Take by mouth daily.    Marland Kitchen rOPINIRole (REQUIP) 1 MG tablet Take 1 mg by mouth daily.    . vitamin C (ASCORBIC ACID) 500 MG tablet Take 1,000 mg by mouth daily.      No current facility-administered medications for this visit.   Allergies: Allergies  Allergen Reactions  . Abilify [Aripiprazole] Other (See Comments)    Very violent  . Gabapentin Other (See Comments)    Nightmares and talking to her nightmares  . Thorazine [Chlorpromazine] Other (See Comments)    Shakes, anxiety off the roof and panic attack  . Seroquel [Quetiapine Fumarate]    Medical History: Past Medical History  Diagnosis Date  . DVT (deep venous thrombosis)   . Anxiety   . Bipolar disorder   . Obsessive-compulsive disorder   . Depression   . Thyroid disease   . Headache(784.0)   . Hypothyroidism   . Hypertension    Surgical History: Past Surgical History  Procedure Laterality Date  . Spine surgery    . Cholecystectomy    . Back surgery     Family History: family history includes ADD / ADHD in her daughter; Anxiety disorder in her daughter; Bipolar disorder in her daughter, maternal aunt, and mother; Diabetes in her father; Drug abuse in her daughter and daughter; Heart disease in her brother, father, and mother; Hyperlipidemia in her brother, father, and mother; Hypertension in her brother, father, mother, and sister; Suicidality in her maternal aunt. There is no history of Alcohol abuse, Dementia, OCD, Paranoid behavior, Schizophrenia, Seizures, Sexual abuse, or Physical abuse. Reviewed and nothing new this visit.  Diagnosis:   Axis I: Bipolar, depressed Axis  II: Deferred Axis III: Hypothyroidism, history of DVT  Axis IV: other psychosocial or environmental problems Axis V: 55  ADL's:  Intact  Sleep: Poor  Appetite:  Good  Suicidal Ideation:  Pt denies any thoughts, plans, intent of suicide Homicidal Ideation:  Pt denies any thoughts, plans, intent of homicide  AEB (as evidenced by):per pt report  Mental Status Examination/Evaluation: Objective:  Appearance: Casual  Eye Contact::  Good  Speech:  Clear and Coherent  Volume:  Normal  Mood:  fairly good   Affect:  Congruent  Thought Process:  Coherent  Orientation:  Full  Thought Content:  WDL  Suicidal Thoughts:  No  Homicidal Thoughts:  No  Memory:  Immediate;   Poor Recent;   Fair Remote;  Poor  Judgement:  Good  Insight:  Fair  Psychomotor Activity:  Normal  Concentration:  Poor  Recall:  Poor  Akathisia:  No  Handed:  Left  AIMS (if indicated):     Assets:  Communication Skills Desire for Improvement  Sleep:   too much   Vitals: BP 112/65 mmHg  Pulse 72  Ht 5' 8.82" (1.748 m)  Wt 228 lb (103.42 kg)  BMI 33.85 kg/m2  Lab Results:  No results found for this or any previous visit (from the past 8736 hour(s)).   Physical Findings: AIMS:  , ,  ,  ,    CIWA:    COWS:    Plan/Discussion: I took her vitals.  I reviewed CC, tobacco/med/surg Hx, meds effects/ side effects, problem list, therapies and responses as well as current situation/symptoms discussed options. She'llcontinue Xanax 1 mg twice a day as needed for anxiety and Lexapro  20 mg daily for depression. She continues Adderall for ADHD through her family physician. She'll return in 2 months but call me if her depression worsens  MEDICATIONS this encounter: Meds ordered this encounter  Medications  . escitalopram (LEXAPRO) 20 MG tablet    Sig: Take 1 tablet (20 mg total) by mouth daily.    Dispense:  30 tablet    Refill:  2  . ALPRAZolam (XANAX) 1 MG tablet    Sig: Take 1 tablet (1 mg total) by  mouth 2 (two) times daily.    Dispense:  60 tablet    Refill:  2  . amphetamine-dextroamphetamine (ADDERALL) 30 MG tablet    Sig: Take 1 tablet by mouth 2 (two) times daily.    Dispense:  60 tablet    Refill:  0  . amphetamine-dextroamphetamine (ADDERALL) 30 MG tablet    Sig: Take 1 tablet by mouth 2 (two) times daily.    Dispense:  60 tablet    Refill:  0    Fill after 08/18/15    Medical Decision Making Problem Points:  Established problem, worsening (2), Review of last therapy session (1) and Review of psycho-social stressors (1) Data Points:  Review or order clinical lab tests (1) Review of medication regiment & side effects (2) Review of new medications or change in dosage (2)  I certify that outpatient services furnished can reasonably be expected to improve the patient's condition.   [name], MD

## 2015-07-29 ENCOUNTER — Other Ambulatory Visit (HOSPITAL_COMMUNITY): Payer: Self-pay | Admitting: Psychiatry

## 2015-08-02 ENCOUNTER — Ambulatory Visit (HOSPITAL_COMMUNITY): Payer: Self-pay | Admitting: Psychology

## 2015-08-17 ENCOUNTER — Ambulatory Visit (HOSPITAL_COMMUNITY): Payer: Self-pay | Admitting: Psychology

## 2015-08-25 ENCOUNTER — Ambulatory Visit (INDEPENDENT_AMBULATORY_CARE_PROVIDER_SITE_OTHER): Payer: Medicaid Other | Admitting: Psychology

## 2015-08-25 DIAGNOSIS — F310 Bipolar disorder, current episode hypomanic: Secondary | ICD-10-CM

## 2015-08-25 DIAGNOSIS — F429 Obsessive-compulsive disorder, unspecified: Secondary | ICD-10-CM | POA: Diagnosis not present

## 2015-09-18 ENCOUNTER — Encounter (HOSPITAL_COMMUNITY): Payer: Self-pay | Admitting: Psychiatry

## 2015-09-18 ENCOUNTER — Ambulatory Visit (INDEPENDENT_AMBULATORY_CARE_PROVIDER_SITE_OTHER): Payer: Medicaid Other | Admitting: Psychiatry

## 2015-09-18 VITALS — BP 128/70 | HR 65 | Ht 70.0 in | Wt 228.2 lb

## 2015-09-18 DIAGNOSIS — F310 Bipolar disorder, current episode hypomanic: Secondary | ICD-10-CM | POA: Diagnosis not present

## 2015-09-18 MED ORDER — ALPRAZOLAM 1 MG PO TABS: 1.0000 mg | ORAL_TABLET | Freq: Two times a day (BID) | ORAL | Status: DC

## 2015-09-18 MED ORDER — ESCITALOPRAM OXALATE 20 MG PO TABS: 20.0000 mg | ORAL_TABLET | Freq: Two times a day (BID) | ORAL | Status: DC

## 2015-09-18 NOTE — Progress Notes (Signed)
Patient ID: Jane [name], female   DOB: 06-Dec-1965, 49 y.o.   MRN: [id number] Patient ID: Jane [name], female   DOB: 09-Apr-1966, 49 y.o.   MRN: [id number] Patient ID: Jane [name], female   DOB: 1966-01-09, 49 y.o.   MRN: [id number] Patient ID: Jane [name], female   DOB: Jun 16, 1966, 49 y.o.   MRN: [id number] Patient ID: Jane [name], female   DOB: 1966/08/12, 49 y.o.   MRN: [id number] Patient ID: Jane [name], female   DOB: 1966-03-24, 49 y.o.   MRN: [id number] Patient ID: Jane [name], female   DOB: 15-Aug-1966, 49 y.o.   MRN: [id number] Patient ID: Jane [name], female   DOB: 29-Apr-1966, 49 y.o.   MRN: [id number] Patient ID: Jane [name], female   DOB: 1966-05-11, 49 y.o.   MRN: [id number] Patient ID: Jane [name], female   DOB: September 02, 1966, 49 y.o.   MRN: [id number] Patient ID: Jane [name], female   DOB: Oct 13, 1966, 49 y.o.   MRN: [id number] Patient ID: Jane [name], female   DOB: 10/13/1966, 49 y.o.   MRN: [id number] Patient ID: Jane [name], female   DOB: September 16, 1966, 49 y.o.   MRN: [id number] Patient ID: Jane [name], female   DOB: November 16, 1965, 49 y.o.   MRN: [id number] Patient ID: Jane [name], female   DOB: [birth year], 49 y.o.   MRN: [id number] Patient ID: Jane [name], female   DOB: [birth year], 49 y.o.   MRN: [id number] Patient ID: Jane [name], female   DOB: 17-Jul-1966, 49 y.o.   MRN: [id number] [facility] [address] Progress Note Shunte Canupp MRN: [id number] DOB: 07-Mar-1966 Age: 49 y.o.  Date: 09/18/2015 Start Time: 1:45 PM End Time: 2:10 PM  Chief Complaint: Chief Complaint  Patient presents with  . Depression  . Anxiety  . Follow-up   Subjective: This patient is a 49 year-old divorced white female who lives alone in [place]. She is on disability due to bipolar disorder.  The patient states that she became very depressed when her mother died in 02/14/2000 of a brain aneurysm. She started getting. Sad but eventually became manic and develop mood swings  and agitated behavior. She's been on numerous medications over the years. In 02/14/1988 she also had postpartum depression. For many years she was on lithium but it caused a bad taste in her mouth and her doctor was worried it might be starting to affect her renal function so was discontinued. She also thinks that it stopped working after a number of years.  The patient returns after 2 months. She denies being depressed but just feels tired all the time and no motivation. She states that she typically gets like this in the winter. She denies being suicidal but just has no energy. I suggested we increase her Lexapro and she is in agreement. The Xanax continues to help her anxiety. She sleeping well in fact sometimes too much. The Adderall helps her focus.  .    Current Outpatient Prescriptions  Medication Sig Dispense Refill  . ALPRAZolam (XANAX) 1 MG tablet Take 1 tablet (1 mg total) by mouth 2 (two) times daily. 60 tablet 2  . amLODipine (NORVASC) 5 MG tablet Take 5 mg by mouth daily.    Marland Kitchen amphetamine-dextroamphetamine (ADDERALL) 30 MG tablet Take 1 tablet by mouth 2 (two) times daily. 60 tablet 0  . aspirin 81 MG tablet Take 81 mg by mouth daily.    . Chlorpheniramine Maleate (ALLERGY RELIEF PO) Take by mouth daily.    . Cholecalciferol (VITAMIN D PO) Take 2,000 mg by mouth daily.    Marland Kitchen  escitalopram (LEXAPRO) 20 MG tablet Take 1 tablet (20 mg total) by mouth 2 (two) times daily. 60 tablet 2  . hydrochlorothiazide (HYDRODIURIL) 25 MG tablet Take 25 mg by mouth daily.    Marland Kitchen HYDROcodone-acetaminophen (NORCO) 10-325 MG per tablet Take 1 tablet by mouth 4 (four) times daily as needed.     Marland Kitchen levothyroxine (SYNTHROID, LEVOTHROID) 150 MCG tablet Take 150 mcg by mouth daily before breakfast.    . Multiple Vitamins-Minerals (MULTIVITAMIN WITH MINERALS) tablet Take 1 tablet by mouth daily.    . Omega-3 Fatty Acids (FISH OIL PO) Take 2 tablets by mouth daily.    . Probiotic Product (PROBIOTIC DAILY PO) Take by  mouth daily.    Marland Kitchen rOPINIRole (REQUIP) 1 MG tablet Take 1 mg by mouth daily.    . vitamin C (ASCORBIC ACID) 500 MG tablet Take 1,000 mg by mouth daily.      No current facility-administered medications for this visit.   Allergies: Allergies  Allergen Reactions  . Abilify [Aripiprazole] Other (See Comments)    Very violent  . Gabapentin Other (See Comments)    Nightmares and talking to her nightmares  . Thorazine [Chlorpromazine] Other (See Comments)    Shakes, anxiety off the roof and panic attack  . Seroquel [Quetiapine Fumarate]    Medical History: Past Medical History  Diagnosis Date  . DVT (deep venous thrombosis) ([place])   . Anxiety   . Bipolar disorder ([place])   . Obsessive-compulsive disorder   . Depression   . Thyroid disease   . Headache(784.0)   . Hypothyroidism   . Hypertension    Surgical History: Past Surgical History  Procedure Laterality Date  . Spine surgery    . Cholecystectomy    . Back surgery     Family History: family history includes ADD / ADHD in her daughter; Anxiety disorder in her daughter; Bipolar disorder in her daughter, maternal aunt, and mother; Diabetes in her father; Drug abuse in her daughter and daughter; Heart disease in her brother, father, and mother; Hyperlipidemia in her brother, father, and mother; Hypertension in her brother, father, mother, and sister; Suicidality in her maternal aunt. There is no history of Alcohol abuse, Dementia, OCD, Paranoid behavior, Schizophrenia, Seizures, Sexual abuse, or Physical abuse. Reviewed and nothing new this visit.  Diagnosis:   Axis I: Bipolar, depressed Axis II: Deferred Axis III: Hypothyroidism, history of DVT  Axis IV: other psychosocial or environmental problems Axis V: 84  ADL's:  Intact  Sleep: Poor  Appetite:  Good  Suicidal Ideation:  Pt denies any thoughts, plans, intent of suicide Homicidal Ideation:  Pt denies any thoughts, plans, intent of homicide  AEB (as evidenced  by):per pt report  Mental Status Examination/Evaluation: Objective:  Appearance: Casual  Eye Contact::  Good  Speech:  Clear and Coherent  Volume:  Normal  Mood: A little down   Affect:  Congruent  Thought Process:  Coherent  Orientation:  Full  Thought Content:  WDL  Suicidal Thoughts:  No  Homicidal Thoughts:  No  Memory:  Immediate;   Poor Recent;   Fair Remote;   Poor  Judgement:  Good  Insight:  Fair  Psychomotor Activity:  Normal  Concentration:  Poor  Recall:  Poor  Akathisia:  No  Handed:  Left  AIMS (if indicated):     Assets:  Communication Skills Desire for Improvement  Sleep:   too much   Vitals: BP 128/70 mmHg  Pulse 65  Ht 5\' 10"  (1.778 m)  Wt 228 lb 3.2 oz (103.511 kg)  BMI 32.74 kg/m2  SpO2 98%  Lab Results:  No results found for this or any previous visit (from the past 8736 hour(s)).   Physical Findings: AIMS:  , ,  ,  ,    CIWA:    COWS:    Plan/Discussion: I took her vitals.  I reviewed CC, tobacco/med/surg Hx, meds effects/ side effects, problem list, therapies and responses as well as current situation/symptoms discussed options. She'llcontinue Xanax 1 mg twice a day as needed for anxiety and increase Lexapro to 40 mg daily for depression. She continues Adderall for ADHD through her family physician. She'll return in 2 months but call me if her depression worsens  MEDICATIONS this encounter: Meds ordered this encounter  Medications  . escitalopram (LEXAPRO) 20 MG tablet    Sig: Take 1 tablet (20 mg total) by mouth 2 (two) times daily.    Dispense:  60 tablet    Refill:  2  . ALPRAZolam (XANAX) 1 MG tablet    Sig: Take 1 tablet (1 mg total) by mouth 2 (two) times daily.    Dispense:  60 tablet    Refill:  2    Medical Decision Making Problem Points:  Established problem, worsening (2), Review of last therapy session (1) and Review of psycho-social stressors (1) Data Points:  Review or order clinical lab tests (1) Review of  medication regiment & side effects (2) Review of new medications or change in dosage (2)  I certify that outpatient services furnished can reasonably be expected to improve the patient's condition.   [name], MD

## 2015-09-20 ENCOUNTER — Ambulatory Visit (INDEPENDENT_AMBULATORY_CARE_PROVIDER_SITE_OTHER): Payer: Medicaid Other | Admitting: Psychology

## 2015-09-20 DIAGNOSIS — F310 Bipolar disorder, current episode hypomanic: Secondary | ICD-10-CM

## 2015-09-25 ENCOUNTER — Encounter (HOSPITAL_COMMUNITY): Payer: Self-pay | Admitting: Psychology

## 2015-09-25 NOTE — Progress Notes (Signed)
    PROGRESS NOTE Patient:  Jane [name]   DOB: 02/11/1966  MR Number: [id number]  Location: [facility] [address] [place] [address] Dept: [phone]  Start: 11 AM End: 12 PM  Provider/Observer:     Edgardo Roys PSYD  Chief Complaint:      Chief Complaint  Patient presents with  . Agitation  . Anxiety  . Depression    Reason For Service:     the patient was referred because of issues associated with bipolar disorder and has been displaying manic episodes as well as symptoms of depression and social isolation.  The patient also has significant obsessive-compulsive disorder symptoms and other anxiety symptoms.  The patient has displayed poor coping skills, further exacerbated by her overall poor functioning.  Interventions Strategy:  Cognitive/behavioral psychotherapeutic interventions  Participation Level:   Active  Participation Quality:  Appropriate      Behavioral Observation:  Well Groomed, Alert, and Blunt.   Current Psychosocial Factors: The patient reports that she has continued to interact with more of her family.  Her sister with disabilities in [place] is having issues that she can not help with and that frustrates her.  Content of Session:   Review current symptoms and continued to work on therapeutic interventions for maintaining stable mood.  Current Status:   The patient reports that she continues to be making efforts at building coping skills and has been working on making better long term decisions.  She reports that depression has improved but that her anxiety has continued to be high with the deaths and everything to deal with in her family.  Mood has been stable and matching psychosocial features.  Patient Progress:   She reports that she had a very difficult last week with major stressors  including a major stressor of an abnormal signal found on her mammogram.  A biopsy was conducted and it was just found to be fatty tissue and not a tumor.  Target Goals:   Target goals include maintaining mood stability, maintaining the ability to live independently, and working on reducing the frequency, intensity, and duration of panic attacks.  Last Reviewed:   09/20/2015  Goals Addressed Today:    Today we worked on issues related to mood stability.  Impression/Diagnosis:   the patient has a long-standing history of bipolar disorder.  However, psychosocial issues and difficulty coping with a wide range of situations further impair our ability to function effectively in two key per psychiatric symptoms in check.  Diagnosis:    Axis I:  Bipolar disorder, current episode hypomanic ([place])      Axis II: No diagnosis      RODENBOUGH,JOHN R, PsyD 09/25/2015

## 2015-09-25 NOTE — Progress Notes (Signed)
    PROGRESS NOTE Patient:  Jane [name]   DOB: [birth year]  MR Number: [id number]  Location: [facility] [address] [place] [address] Dept: [phone]  Start: 11 AM End: 12 PM  Provider/Observer:     Edgardo Roys PSYD  Chief Complaint:      Chief Complaint  Patient presents with  . Anxiety  . Stress    Reason For Service:     the patient was referred because of issues associated with bipolar disorder and has been displaying manic episodes as well as symptoms of depression and social isolation.  The patient also has significant obsessive-compulsive disorder symptoms and other anxiety symptoms.  The patient has displayed poor coping skills, further exacerbated by her overall poor functioning.  Interventions Strategy:  Cognitive/behavioral psychotherapeutic interventions  Participation Level:   Active  Participation Quality:  Appropriate      Behavioral Observation:  Well Groomed, Alert, and Blunt.   Current Psychosocial Factors: The patient reports that her aunt has died and that she has spent a lot of time about her family and it has actually gone well.  The conflicts with one of her daughters was centered around this aunt and that they were able to work on these issues after the funeral.  Content of Session:   Review current symptoms and continued to work on therapeutic interventions for maintaining stable mood.  Current Status:   The patient reports that she continues to be making efforts at building coping skills and has been working on making better long term decisions.  She rpeort that she has had more depression lately but improvements in her overall life has helped.  She has gotten some relief of pain due to injections in her back and is looking at having surgery on her back.  Patient Progress:   She reports that she had a very difficult last week with major stressors   including a major stressor of an abnormal signal found on her mammogram. A biopsy was conducted and it was just found to be fatty tissue and not a tumor.  Target Goals:   Target goals include maintaining mood stability, maintaining the ability to live independently, and working on reducing the frequency, intensity, and duration of panic attacks.  Last Reviewed:   07/29/2015  Goals Addressed Today:    Today we worked on issues related to mood stability.  Impression/Diagnosis:   the patient has a long-standing history of bipolar disorder.  However, psychosocial issues and difficulty coping with a wide range of situations further impair our ability to function effectively in two key per psychiatric symptoms in check.  Diagnosis:    Axis I:  Bipolar disorder, current episode hypomanic ([place])  OCD (obsessive compulsive disorder)      Axis II: No diagnosis      RODENBOUGH,JOHN R, PsyD 09/25/2015

## 2015-10-20 ENCOUNTER — Ambulatory Visit (HOSPITAL_COMMUNITY): Payer: Self-pay | Admitting: Psychology

## 2015-11-13 ENCOUNTER — Ambulatory Visit (HOSPITAL_COMMUNITY): Payer: Self-pay | Admitting: Psychiatry

## 2015-11-15 ENCOUNTER — Telehealth (HOSPITAL_COMMUNITY): Payer: Self-pay | Admitting: *Deleted

## 2015-11-16 ENCOUNTER — Ambulatory Visit (INDEPENDENT_AMBULATORY_CARE_PROVIDER_SITE_OTHER): Payer: Medicaid Other | Admitting: Psychology

## 2015-11-16 DIAGNOSIS — F429 Obsessive-compulsive disorder, unspecified: Secondary | ICD-10-CM | POA: Diagnosis not present

## 2015-11-16 DIAGNOSIS — F310 Bipolar disorder, current episode hypomanic: Secondary | ICD-10-CM

## 2015-11-20 ENCOUNTER — Other Ambulatory Visit (HOSPITAL_COMMUNITY): Payer: Self-pay | Admitting: Psychiatry

## 2015-11-30 ENCOUNTER — Encounter (HOSPITAL_COMMUNITY): Payer: Self-pay | Admitting: Psychiatry

## 2015-11-30 ENCOUNTER — Ambulatory Visit (INDEPENDENT_AMBULATORY_CARE_PROVIDER_SITE_OTHER): Payer: Medicaid Other | Admitting: Psychiatry

## 2015-11-30 VITALS — BP 115/73 | HR 59 | Ht 70.0 in | Wt 231.8 lb

## 2015-11-30 DIAGNOSIS — F3130 Bipolar disorder, current episode depressed, mild or moderate severity, unspecified: Secondary | ICD-10-CM

## 2015-11-30 DIAGNOSIS — F310 Bipolar disorder, current episode hypomanic: Secondary | ICD-10-CM

## 2015-11-30 MED ORDER — TRAZODONE HCL 50 MG PO TABS: 50.0000 mg | ORAL_TABLET | Freq: Every day | ORAL | Status: DC

## 2015-11-30 MED ORDER — AMPHETAMINE-DEXTROAMPHETAMINE 30 MG PO TABS: 30.0000 mg | ORAL_TABLET | Freq: Two times a day (BID) | ORAL | Status: DC

## 2015-11-30 MED ORDER — ALPRAZOLAM 1 MG PO TABS: 1.0000 mg | ORAL_TABLET | Freq: Two times a day (BID) | ORAL | Status: DC

## 2015-11-30 MED ORDER — ESCITALOPRAM OXALATE 20 MG PO TABS: 20.0000 mg | ORAL_TABLET | Freq: Two times a day (BID) | ORAL | Status: DC

## 2015-11-30 NOTE — Progress Notes (Signed)
Patient ID: Jane [name], female   DOB: 06-03-1966, 50 y.o.   MRN: [id number] Patient ID: Jane [name], female   DOB: Apr 27, 1966, 50 y.o.   MRN: [id number] Patient ID: Jane [name], female   DOB: [birth year], 50 y.o.   MRN: [id number] Patient ID: Jane [name], female   DOB: 15-Oct-1966, 50 y.o.   MRN: [id number] Patient ID: Jane [name], female   DOB: [birth year], 50 y.o.   MRN: [id number] Patient ID: Jane [name], female   DOB: 14-Jun-1966, 50 y.o.   MRN: [id number] Patient ID: Jane [name], female   DOB: April 07, 1966, 50 y.o.   MRN: [id number] Patient ID: Jane [name], female   DOB: 1966-03-23, 50 y.o.   MRN: [id number] Patient ID: Jane [name], female   DOB: [birth year], 50 y.o.   MRN: [id number] Patient ID: Jane [name], female   DOB: 11/23/1965, 50 y.o.   MRN: [id number] Patient ID: Jane [name], female   DOB: 02-Feb-1966, 50 y.o.   MRN: [id number] Patient ID: Jane [name], female   DOB: January 26, 1966, 50 y.o.   MRN: [id number] Patient ID: Jane [name], female   DOB: 11/16/1965, 50 y.o.   MRN: [id number] Patient ID: Jane [name], female   DOB: 1966/08/13, 50 y.o.   MRN: [id number] Patient ID: Jane [name], female   DOB: November 10, 1965, 50 y.o.   MRN: [id number] Patient ID: Jane [name], female   DOB: [birth year], 50 y.o.   MRN: [id number] Patient ID: Jane [name], female   DOB: 06-18-1966, 50 y.o.   MRN: [id number] Patient ID: Jane [name], female   DOB: 1966/10/16, 50 y.o.   MRN: [id number] [facility] [id number] Progress Note Jane [name] MRN: [id number] DOB: 01/17/1966 Age: 50 y.o.  Date: 11/30/2015 Start Time: 1:45 PM End Time: 2:10 PM  Chief Complaint: Chief Complaint  Patient presents with  . Depression  . Anxiety  . Follow-up   Subjective: This patient is a 50 year-old divorced white female who lives alone in [place]. She is on disability due to bipolar disorder.  The patient states that she became very depressed when her mother died in 02-23-00 of a brain  aneurysm. She started getting. Sad but eventually became manic and develop mood swings and agitated behavior. She's been on numerous medications over the years. In 23-Feb-1988 she also had postpartum depression. For many years she was on lithium but it caused a bad taste in her mouth and her doctor was worried it might be starting to affect her renal function so was discontinued. She also thinks that it stopped working after a number of years.  The patient returns after 2 months.  She states that her mood has been up and down. She's not speaking to her youngest daughter and consequently is not seeing her 3 grandsons very much. On the positive side her eldest daughter has come back into her life. She is trying to stay active and busy through the day but still is not sleeping well. In the past she had a good response to trazodone so we can reinitiate it. She thinks her mood will improve if she can improve her sleep. The Adderall continues to help her focus. .    Current Outpatient Prescriptions  Medication Sig Dispense Refill  . ALPRAZolam (XANAX) 1 MG tablet Take 1 tablet (1 mg total) by mouth 2 (two) times daily. 60 tablet 2  . amLODipine (NORVASC) 5 MG tablet Take 5 mg by mouth daily.    Marland Kitchen amphetamine-dextroamphetamine (ADDERALL) 30 MG tablet Take 1 tablet by mouth 2 (two) times daily. 60 tablet  0  . Chlorpheniramine Maleate (ALLERGY RELIEF PO) Take by mouth daily as needed.     . Cholecalciferol (VITAMIN D PO) Take 2,000 mg by mouth daily.    Marland Kitchen escitalopram (LEXAPRO) 20 MG tablet Take 1 tablet (20 mg total) by mouth 2 (two) times daily. 60 tablet 2  . hydrochlorothiazide (HYDRODIURIL) 25 MG tablet Take 25 mg by mouth daily.    Marland Kitchen HYDROcodone-acetaminophen (NORCO) 10-325 MG per tablet Take 1 tablet by mouth 4 (four) times daily as needed.     Marland Kitchen levothyroxine (SYNTHROID, LEVOTHROID) 150 MCG tablet Take 150 mcg by mouth daily before breakfast.    . Multiple Vitamins-Minerals (MULTIVITAMIN WITH MINERALS)  tablet Take 1 tablet by mouth daily.    . Omega-3 Fatty Acids (FISH OIL PO) Take 2 tablets by mouth daily.    . Probiotic Product (PROBIOTIC DAILY PO) Take by mouth daily.    Marland Kitchen rOPINIRole (REQUIP) 1 MG tablet Take 1 mg by mouth 2 (two) times daily.     . vitamin C (ASCORBIC ACID) 500 MG tablet Take 1,000 mg by mouth daily.     Marland Kitchen amphetamine-dextroamphetamine (ADDERALL) 30 MG tablet Take 1 tablet by mouth 2 (two) times daily. 60 tablet 0  . traZODone (DESYREL) 50 MG tablet Take 1 tablet (50 mg total) by mouth at bedtime. 30 tablet 2   No current facility-administered medications for this visit.   Allergies: Allergies  Allergen Reactions  . Abilify [Aripiprazole] Other (See Comments)    Very violent  . Gabapentin Other (See Comments)    Nightmares and talking to her nightmares  . Thorazine [Chlorpromazine] Other (See Comments)    Shakes, anxiety off the roof and panic attack  . Seroquel [Quetiapine Fumarate]    Medical History: Past Medical History  Diagnosis Date  . DVT (deep venous thrombosis) ([place])   . Anxiety   . Bipolar disorder ([place])   . Obsessive-compulsive disorder   . Depression   . Thyroid disease   . Headache(784.0)   . Hypothyroidism   . Hypertension    Surgical History: Past Surgical History  Procedure Laterality Date  . Spine surgery    . Cholecystectomy    . Back surgery     Family History: family history includes ADD / ADHD in her daughter; Anxiety disorder in her daughter; Bipolar disorder in her daughter, maternal aunt, and mother; Diabetes in her father; Drug abuse in her daughter and daughter; Heart disease in her brother, father, and mother; Hyperlipidemia in her brother, father, and mother; Hypertension in her brother, father, mother, and sister; Suicidality in her maternal aunt. There is no history of Alcohol abuse, Dementia, OCD, Paranoid behavior, Schizophrenia, Seizures, Sexual abuse, or Physical abuse. Reviewed and nothing new this  visit.  Diagnosis:   Axis I: Bipolar, depressed Axis II: Deferred Axis III: Hypothyroidism, history of DVT  Axis IV: other psychosocial or environmental problems Axis V: 69  ADL's:  Intact  Sleep: Poor  Appetite:  Good  Suicidal Ideation:  Pt denies any thoughts, plans, intent of suicide Homicidal Ideation:  Pt denies any thoughts, plans, intent of homicide  AEB (as evidenced by):per pt report  Mental Status Examination/Evaluation: Objective:  Appearance: Casual  Eye Contact::  Good  Speech:  Clear and Coherent  Volume:  Normal  Mood: A little down, tired  Affect:  Congruent  Thought Process:  Coherent  Orientation:  Full  Thought Content:  WDL  Suicidal Thoughts:  No  Homicidal Thoughts:  No  Memory:  Immediate;  Poor Recent;   Fair Remote;   Poor  Judgement:  Good  Insight:  Fair  Psychomotor Activity:  Normal  Concentration:  Poor  Recall:  Poor  Akathisia:  No  Handed:  Left  AIMS (if indicated):     Assets:  Communication Skills Desire for Improvement  Sleep:   too much   Vitals: BP 115/73 mmHg  Pulse 59  Ht 5\' 10"  (1.778 m)  Wt 231 lb 12.8 oz (105.144 kg)  BMI 33.26 kg/m2  SpO2 97%  Lab Results:  No results found for this or any previous visit (from the past 8736 hour(s)).   Physical Findings: AIMS:  , ,  ,  ,    CIWA:    COWS:    Plan/Discussion: I took her vitals.  I reviewed CC, tobacco/med/surg Hx, meds effects/ side effects, problem list, therapies and responses as well as current situation/symptoms discussed options. She'llcontinue Xanax 1 mg twice a day as needed for anxiety and  Lexapro to 40 mg daily for depression. She  Continue Adderall for ADHD and start trazodone 50mg  at bedtime for sleep. She'll return in 2 months but call me if her depression worsens  MEDICATIONS this encounter: Meds ordered this encounter  Medications  . escitalopram (LEXAPRO) 20 MG tablet    Sig: Take 1 tablet (20 mg total) by mouth 2 (two) times daily.     Dispense:  60 tablet    Refill:  2  . traZODone (DESYREL) 50 MG tablet    Sig: Take 1 tablet (50 mg total) by mouth at bedtime.    Dispense:  30 tablet    Refill:  2  . ALPRAZolam (XANAX) 1 MG tablet    Sig: Take 1 tablet (1 mg total) by mouth 2 (two) times daily.    Dispense:  60 tablet    Refill:  2  . amphetamine-dextroamphetamine (ADDERALL) 30 MG tablet    Sig: Take 1 tablet by mouth 2 (two) times daily.    Dispense:  60 tablet    Refill:  0  . amphetamine-dextroamphetamine (ADDERALL) 30 MG tablet    Sig: Take 1 tablet by mouth 2 (two) times daily.    Dispense:  60 tablet    Refill:  0    Fill after 12/31/15    Medical Decision Making Problem Points:  Established problem, worsening (2), Review of last therapy session (1) and Review of psycho-social stressors (1) Data Points:  Review or order clinical lab tests (1) Review of medication regiment & side effects (2) Review of new medications or change in dosage (2)  I certify that outpatient services furnished can reasonably be expected to improve the patient's condition.   [name], MD

## 2015-12-21 ENCOUNTER — Ambulatory Visit (HOSPITAL_COMMUNITY): Payer: Self-pay | Admitting: Psychology

## 2015-12-26 ENCOUNTER — Other Ambulatory Visit (HOSPITAL_COMMUNITY): Payer: Self-pay | Admitting: Pulmonary Disease

## 2015-12-28 ENCOUNTER — Ambulatory Visit (HOSPITAL_COMMUNITY)
Admission: RE | Admit: 2015-12-28 | Discharge: 2015-12-28 | Disposition: A | Discharge status: Home / Self Care | Payer: Medicaid Other | Source: Ambulatory Visit | Attending: Pulmonary Disease | Admitting: Pulmonary Disease

## 2015-12-28 ENCOUNTER — Other Ambulatory Visit (HOSPITAL_COMMUNITY): Payer: Self-pay | Admitting: Pulmonary Disease

## 2015-12-28 DIAGNOSIS — M79601 Pain in right arm: Secondary | ICD-10-CM | POA: Diagnosis present

## 2015-12-28 DIAGNOSIS — M25572 Pain in left ankle and joints of left foot: Secondary | ICD-10-CM | POA: Diagnosis not present

## 2015-12-28 DIAGNOSIS — R52 Pain, unspecified: Secondary | ICD-10-CM

## 2016-01-18 ENCOUNTER — Other Ambulatory Visit (HOSPITAL_COMMUNITY): Payer: Self-pay | Admitting: Pulmonary Disease

## 2016-01-18 DIAGNOSIS — I872 Venous insufficiency (chronic) (peripheral): Secondary | ICD-10-CM

## 2016-01-24 ENCOUNTER — Ambulatory Visit (HOSPITAL_COMMUNITY): Payer: Medicaid Other | Attending: Pulmonary Disease

## 2016-01-29 ENCOUNTER — Encounter (HOSPITAL_COMMUNITY): Payer: Self-pay | Admitting: Psychiatry

## 2016-01-29 ENCOUNTER — Ambulatory Visit (HOSPITAL_COMMUNITY): Payer: Self-pay | Admitting: Psychiatry

## 2016-02-02 ENCOUNTER — Ambulatory Visit (HOSPITAL_COMMUNITY): Payer: Self-pay | Admitting: Psychology

## 2016-02-19 ENCOUNTER — Ambulatory Visit (HOSPITAL_COMMUNITY): Payer: Self-pay | Admitting: Psychology

## 2016-02-20 ENCOUNTER — Encounter (HOSPITAL_COMMUNITY): Payer: Self-pay | Admitting: Psychology

## 2016-02-20 NOTE — Progress Notes (Signed)
    PROGRESS NOTE Patient:  Jane [name]   DOB: Feb 04, 1966  MR Number: [id number]  Location: [facility] [address] [place] [address] Dept: [phone]  Start: 11 AM End: 12 PM  Provider/Observer:     Edgardo Roys PSYD  Chief Complaint:      Chief Complaint  Patient presents with  . Depression  . Anxiety  . Stress  . Agitation    Reason For Service:     the patient was referred because of issues associated with bipolar disorder and has been displaying manic episodes as well as symptoms of depression and social isolation.  The patient also has significant obsessive-compulsive disorder symptoms and other anxiety symptoms.  The patient has displayed poor coping skills, further exacerbated by her overall poor functioning.  Interventions Strategy:  Cognitive/behavioral psychotherapeutic interventions  Participation Level:   Active  Participation Quality:  Appropriate      Behavioral Observation:  Well Groomed, Alert, and Blunt.   Current Psychosocial Factors: The patient reports that she has continued to interact with more of her family.  Her sister with disabilities in [place] is having issues that she can not help with and that frustrates her.  Content of Session:   Review current symptoms and continued to work on therapeutic interventions for maintaining stable mood.  Current Status:   The patient reports that she continues to be making efforts at building coping skills and has been working on making better long term decisions.  She reports that depression has improved but that her anxiety has continued to be high with the deaths and everything to deal with in her family.  Mood has been stable and matching psychosocial features.  Patient Progress:   She reports that she had a very difficult last week with major stressors  including a major stressor of an abnormal signal found on her  mammogram. A biopsy was conducted and it was just found to be fatty tissue and not a tumor.  Target Goals:   Target goals include maintaining mood stability, maintaining the ability to live independently, and working on reducing the frequency, intensity, and duration of panic attacks.  Last Reviewed:   11/16/2015  Goals Addressed Today:    Today we worked on issues related to mood stability.  Impression/Diagnosis:   the patient has a long-standing history of bipolar disorder.  However, psychosocial issues and difficulty coping with a wide range of situations further impair our ability to function effectively in two key per psychiatric symptoms in check.  Diagnosis:    Axis I:  Bipolar disorder, current episode hypomanic (HCC)  OCD (obsessive compulsive disorder)      Axis II: No diagnosis      Andria Head R, PsyD 02/20/2016

## 2016-02-21 ENCOUNTER — Ambulatory Visit (HOSPITAL_COMMUNITY): Payer: Self-pay | Admitting: Psychology

## 2016-03-04 ENCOUNTER — Ambulatory Visit (INDEPENDENT_AMBULATORY_CARE_PROVIDER_SITE_OTHER): Payer: Medicaid Other | Admitting: Psychiatry

## 2016-03-04 ENCOUNTER — Encounter (HOSPITAL_COMMUNITY): Payer: Self-pay | Admitting: Psychiatry

## 2016-03-04 VITALS — BP 110/61 | HR 69 | Ht 70.0 in | Wt 232.0 lb

## 2016-03-04 DIAGNOSIS — F3130 Bipolar disorder, current episode depressed, mild or moderate severity, unspecified: Secondary | ICD-10-CM

## 2016-03-04 DIAGNOSIS — F331 Major depressive disorder, recurrent, moderate: Secondary | ICD-10-CM

## 2016-03-04 MED ORDER — ESCITALOPRAM OXALATE 20 MG PO TABS: 20.0000 mg | ORAL_TABLET | Freq: Two times a day (BID) | ORAL | Status: DC

## 2016-03-04 MED ORDER — ALPRAZOLAM 1 MG PO TABS: 1.0000 mg | ORAL_TABLET | Freq: Two times a day (BID) | ORAL | Status: DC

## 2016-03-04 NOTE — Progress Notes (Signed)
Patient ID: Gracianna Shytle, female   DOB: [birth year], 50 y.o.   MRN: [id number] Patient ID: Joylin Dichiara, female   DOB: 1966-02-12, 50 y.o.   MRN: [id number] Patient ID: Karisa Hackbarth, female   DOB: [birth year], 50 y.o.   MRN: [id number] Patient ID: Cissy Sasseen, female   DOB: Jan 16, 1966, 50 y.o.   MRN: [id number] Patient ID: Ruchama Wilkowski, female   DOB: 1966-10-10, 50 y.o.   MRN: [id number] Patient ID: Loreli Benzinger, female   DOB: Dec 23, 1965, 50 y.o.   MRN: [id number] Patient ID: Genie Moorhouse, female   DOB: 05-01-1966, 50 y.o.   MRN: [id number] Patient ID: Lagina Hover, female   DOB: 04/14/1966, 50 y.o.   MRN: [id number] Patient ID: Dories Herdegen, female   DOB: [birth year], 50 y.o.   MRN: [id number] Patient ID: Zondra Bolstad, female   DOB: 08/15/1966, 50 y.o.   MRN: [id number] Patient ID: Ivanka Coleson, female   DOB: Mar 31, 1966, 50 y.o.   MRN: [id number] Patient ID: Shamanda Niziolek, female   DOB: [birth year], 50 y.o.   MRN: [id number] Patient ID: Jorah Furse, female   DOB: 1966/10/23, 50 y.o.   MRN: [id number] Patient ID: Adreena Wery, female   DOB: 1966/06/28, 50 y.o.   MRN: [id number] Patient ID: Cris Holverson, female   DOB: 1966-06-16, 50 y.o.   MRN: [id number] Patient ID: Lashonia Henjum, female   DOB: September 22, 1966, 50 y.o.   MRN: [id number] Patient ID: Aiden Birchett, female   DOB: [birth year], 50 y.o.   MRN: [id number] Patient ID: Eliya Wilt, female   DOB: 02/21/1966, 50 y.o.   MRN: [id number] Patient ID: Mikhala Manry, female   DOB: [birth year], 50 y.o.   MRN: [id number] [facility] [id number] Progress Note Shontavia Moseley MRN: [id number] DOB: Jul 18, 1966 Age: 50 y.o.  Date: 03/04/2016 Start Time: 1:45 PM End Time: 2:10 PM  Chief Complaint: Chief Complaint  Patient presents with  . Depression  . Anxiety  . Follow-up   Subjective: This patient is a 50 year-old divorced white female who lives alone in [place]. She is on disability due to bipolar disorder.  The patient states  that she became very depressed when her mother died in 2000-02-24 of a brain aneurysm. She started getting. Sad but eventually became manic and develop mood swings and agitated behavior. She's been on numerous medications over the years. In 02/24/1988 she also had postpartum depression. For many years she was on lithium but it caused a bad taste in her mouth and her doctor was worried it might be starting to affect her renal function so was discontinued. She also thinks that it stopped working after a number of years.  The patient returns after 3 months.  She states that her mood has been good. She and her oldest daughter have been going to the gym quite a bit at night. She is enjoying spending time with her daughter's children. Her youngest daughter however is not speaking to her right now. Overall however she states that her mood is good and she is sleeping well without trazodone but occasionally needs melatonin. The Lexapro is helped her depression and Xanax has helped her anxiety. Dr. [name] still prescribes her Adderall for ADD .    Current Outpatient Prescriptions  Medication Sig Dispense Refill  . ALPRAZolam (XANAX) 1 MG tablet Take 1 tablet (1 mg total) by mouth 2 (two) times daily. 60 tablet 2  . amLODipine (NORVASC) 5 MG tablet Take 5 mg by mouth daily.    Marland Kitchen amphetamine-dextroamphetamine (ADDERALL) 30 MG tablet Take 1 tablet  by mouth 2 (two) times daily. 60 tablet 0  . Chlorpheniramine Maleate (ALLERGY RELIEF PO) Take by mouth daily as needed.     . Cholecalciferol (VITAMIN D PO) Take 2,000 mg by mouth daily.    Marland Kitchen escitalopram (LEXAPRO) 20 MG tablet Take 1 tablet (20 mg total) by mouth 2 (two) times daily. 60 tablet 2  . hydrochlorothiazide (HYDRODIURIL) 25 MG tablet Take 25 mg by mouth daily.    Marland Kitchen HYDROcodone-acetaminophen (NORCO) 10-325 MG per tablet Take 1 tablet by mouth 4 (four) times daily as needed.     Marland Kitchen levothyroxine (SYNTHROID, LEVOTHROID) 150 MCG tablet Take 150 mcg by mouth daily before  breakfast.    . Multiple Vitamins-Minerals (MULTIVITAMIN WITH MINERALS) tablet Take 1 tablet by mouth daily.    Marland Kitchen rOPINIRole (REQUIP) 1 MG tablet Take 1 mg by mouth 2 (two) times daily.     . vitamin C (ASCORBIC ACID) 500 MG tablet Take 1,000 mg by mouth daily.      No current facility-administered medications for this visit.   Allergies: Allergies  Allergen Reactions  . Abilify [Aripiprazole] Other (See Comments)    Very violent  . Gabapentin Other (See Comments)    Nightmares and talking to her nightmares  . Thorazine [Chlorpromazine] Other (See Comments)    Shakes, anxiety off the roof and panic attack  . Seroquel [Quetiapine Fumarate]    Medical History: Past Medical History  Diagnosis Date  . DVT (deep venous thrombosis) ([place])   . Anxiety   . Bipolar disorder ([place])   . Obsessive-compulsive disorder   . Depression   . Thyroid disease   . Headache(784.0)   . Hypothyroidism   . Hypertension    Surgical History: Past Surgical History  Procedure Laterality Date  . Spine surgery    . Cholecystectomy    . Back surgery     Family History: family history includes ADD / ADHD in her daughter; Anxiety disorder in her daughter; Bipolar disorder in her daughter, maternal aunt, and mother; Diabetes in her father; Drug abuse in her daughter and daughter; Heart disease in her brother, father, and mother; Hyperlipidemia in her brother, father, and mother; Hypertension in her brother, father, mother, and sister; Suicidality in her maternal aunt. There is no history of Alcohol abuse, Dementia, OCD, Paranoid behavior, Schizophrenia, Seizures, Sexual abuse, or Physical abuse. Reviewed and nothing new this visit.  Diagnosis:   Axis I: Bipolar, depressed Axis II: Deferred Axis III: Hypothyroidism, history of DVT  Axis IV: other psychosocial or environmental problems Axis V: 62  ADL's:  Intact  Sleep: Poor  Appetite:  Good  Suicidal Ideation:  Pt denies any thoughts, plans,  intent of suicide Homicidal Ideation:  Pt denies any thoughts, plans, intent of homicide  AEB (as evidenced by):per pt report  Mental Status Examination/Evaluation: Objective:  Appearance: Casual  Eye Contact::  Good  Speech:  Clear and Coherent  Volume:  Normal  Mood: Good   Affect:  Bright   Thought Process:  Coherent  Orientation:  Full  Thought Content:  WDL  Suicidal Thoughts:  No  Homicidal Thoughts:  No  Memory:  Immediate;   Poor Recent;   Fair Remote;   Poor  Judgement:  Good  Insight:  Fair  Psychomotor Activity:  Normal  Concentration:  Poor  Recall:  Poor  Akathisia:  No  Handed:  Left  AIMS (if indicated):     Assets:  Communication Skills Desire for Improvement  Sleep:   too much  Vitals: BP 110/61 mmHg  Pulse 69  Ht 5\' 10"  (1.778 m)  Wt 232 lb (105.235 kg)  BMI 33.29 kg/m2  Lab Results:  No results found for this or any previous visit (from the past 8736 hour(s)).   Physical Findings: AIMS:  , ,  ,  ,    CIWA:    COWS:    Plan/Discussion: I took her vitals.  I reviewed CC, tobacco/med/surg Hx, meds effects/ side effects, problem list, therapies and responses as well as current situation/symptoms discussed options. She'llcontinue Xanax 1 mg twice a day as needed for anxiety and  Lexapro to 40 mg daily for depression. She  Continue Adderall for ADHD . She'll return in 3 months but call me if her depression worsens  MEDICATIONS this encounter: Meds ordered this encounter  Medications  . escitalopram (LEXAPRO) 20 MG tablet    Sig: Take 1 tablet (20 mg total) by mouth 2 (two) times daily.    Dispense:  60 tablet    Refill:  2  . ALPRAZolam (XANAX) 1 MG tablet    Sig: Take 1 tablet (1 mg total) by mouth 2 (two) times daily.    Dispense:  60 tablet    Refill:  2    Medical Decision Making Problem Points:  Established problem, worsening (2), Review of last therapy session (1) and Review of psycho-social stressors (1) Data Points:  Review or  order clinical lab tests (1) Review of medication regiment & side effects (2) Review of new medications or change in dosage (2)  I certify that outpatient services furnished can reasonably be expected to improve the patient's condition.   [name], MD

## 2016-03-12 ENCOUNTER — Ambulatory Visit (INDEPENDENT_AMBULATORY_CARE_PROVIDER_SITE_OTHER): Payer: Medicaid Other | Admitting: Psychology

## 2016-04-11 ENCOUNTER — Ambulatory Visit (INDEPENDENT_AMBULATORY_CARE_PROVIDER_SITE_OTHER): Payer: Medicaid Other | Admitting: Psychology

## 2016-04-11 DIAGNOSIS — F429 Obsessive-compulsive disorder, unspecified: Secondary | ICD-10-CM

## 2016-04-11 DIAGNOSIS — F331 Major depressive disorder, recurrent, moderate: Secondary | ICD-10-CM | POA: Diagnosis not present

## 2016-05-20 ENCOUNTER — Ambulatory Visit (HOSPITAL_COMMUNITY): Payer: Self-pay | Admitting: Psychology

## 2016-05-22 ENCOUNTER — Other Ambulatory Visit (HOSPITAL_COMMUNITY): Payer: Self-pay | Admitting: Psychiatry

## 2016-06-04 ENCOUNTER — Ambulatory Visit (HOSPITAL_COMMUNITY): Payer: Self-pay | Admitting: Psychiatry

## 2016-06-04 HISTORY — PX: ABDOMINAL HYSTERECTOMY: SHX81

## 2016-06-24 ENCOUNTER — Telehealth (HOSPITAL_COMMUNITY): Payer: Self-pay | Admitting: *Deleted

## 2016-06-24 NOTE — Telephone Encounter (Signed)
left voice message regarding provider out of office/07/09/16. 

## 2016-07-01 ENCOUNTER — Other Ambulatory Visit (HOSPITAL_COMMUNITY): Payer: Self-pay | Admitting: Pulmonary Disease

## 2016-07-01 DIAGNOSIS — Z823 Family history of stroke: Secondary | ICD-10-CM

## 2016-07-01 DIAGNOSIS — R609 Edema, unspecified: Secondary | ICD-10-CM

## 2016-07-09 ENCOUNTER — Ambulatory Visit (HOSPITAL_COMMUNITY): Payer: Self-pay | Admitting: Psychology

## 2016-07-10 ENCOUNTER — Encounter (HOSPITAL_COMMUNITY): Payer: Self-pay | Admitting: Psychiatry

## 2016-07-10 ENCOUNTER — Ambulatory Visit (INDEPENDENT_AMBULATORY_CARE_PROVIDER_SITE_OTHER): Payer: Medicaid Other | Admitting: Psychiatry

## 2016-07-10 VITALS — BP 125/67 | HR 71 | Ht 70.0 in | Wt 238.2 lb

## 2016-07-10 DIAGNOSIS — F3130 Bipolar disorder, current episode depressed, mild or moderate severity, unspecified: Secondary | ICD-10-CM | POA: Diagnosis not present

## 2016-07-10 DIAGNOSIS — F331 Major depressive disorder, recurrent, moderate: Secondary | ICD-10-CM

## 2016-07-10 MED ORDER — ALPRAZOLAM 1 MG PO TABS: 1.0000 mg | ORAL_TABLET | Freq: Two times a day (BID) | ORAL | 2 refills | Status: DC

## 2016-07-10 MED ORDER — ESCITALOPRAM OXALATE 20 MG PO TABS: ORAL_TABLET | ORAL | 2 refills | Status: DC

## 2016-07-10 NOTE — Progress Notes (Signed)
Patient ID: Jane [name], female   DOB: 1966-04-03, 50 y.o.   MRN: [id number] Patient ID: Shanni Melchi, female   DOB: 04-28-1966, 50 y.o.   MRN: [id number] Patient ID: Hilari Hable, female   DOB: Dec 02, 1965, 50 y.o.   MRN: [id number] Patient ID: Karianna Pacha, female   DOB: 01/02/1966, 50 y.o.   MRN: [id number] Patient ID: Meridee Binner, female   DOB: 09/16/1966, 50 y.o.   MRN: [id number] Patient ID: Keeghan Anhorn, female   DOB: April 21, 1966, 50 y.o.   MRN: [id number] Patient ID: Curstin Northrop, female   DOB: 12/19/1965, 50 y.o.   MRN: [id number] Patient ID: Amiaya Arvizu, female   DOB: [birth year], 50 y.o.   MRN: [id number] Patient ID: Brittyn Klecha, female   DOB: 03/04/1966, 50 y.o.   MRN: [id number] Patient ID: Ailynn Renninger, female   DOB: 1966-09-03, 50 y.o.   MRN: [id number] Patient ID: Kenedy Binda, female   DOB: Mar 27, 1966, 50 y.o.   MRN: [id number] Patient ID: Makyrah Weech, female   DOB: 1966/10/29, 50 y.o.   MRN: [id number] Patient ID: Ajournee Speakman, female   DOB: 1966-07-06, 50 y.o.   MRN: [id number] Patient ID: Analicia Bartolucci, female   DOB: [birth year], 50 y.o.   MRN: [id number] Patient ID: Mirjana Brunell, female   DOB: 1966/10/27, 50 y.o.   MRN: [id number] Patient ID: Manisha Carol, female   DOB: 09/19/1966, 50 y.o.   MRN: [id number] Patient ID: Carloyn Schulke, female   DOB: 17-Feb-1966, 50 y.o.   MRN: [id number] Patient ID: Vora Cacy, female   DOB: December 30, 1965, 50 y.o.   MRN: [id number] Patient ID: Shakendra Landini, female   DOB: 05-Dec-1965, 50 y.o.   MRN: [id number] [facility] [id number] Progress Note Karalena Eddings MRN: [id number] DOB: 25-May-1966 Age: 50 y.o.  Date: 07/10/2016 Start Time: 1:45 PM End Time: 2:10 PM  Chief Complaint: Chief Complaint  Patient presents with  . Depression  . Anxiety  . ADD  . Follow-up   Subjective: This patient is a 50 year-old divorced white female who lives alone in [place]. She is on disability due to bipolar disorder.  The patient  states that she became very depressed when her mother died in 2000-02-08 of a brain aneurysm. She started getting. Sad but eventually became manic and develop mood swings and agitated behavior. She's been on numerous medications over the years. In 02/08/88 she also had postpartum depression. For many years she was on lithium but it caused a bad taste in her mouth and her doctor was worried it might be starting to affect her renal function so was discontinued. She also thinks that it stopped working after a number of years.  The patient returns after 3 months.  She states that her mood has been ok. She still has ups and downs but they are not extreme. She is having a lot of difficulties with her youngest daughter because this daughter gets angry and follow told blows up. According to the patient this daughter has bipolar disorder but is not currently being treated. She has to keep her distance. She is getting quite close to her oldest daughter and spending a lot of time with her and her children. She is planning to go back to [facility] to get a GED. Her mood has generally been good and the Xanax continues to help her anxiety. She continues to get the Adderall from her primary care physician .    Current Outpatient Prescriptions  Medication Sig Dispense Refill  . ALPRAZolam (XANAX) 1 MG tablet Take 1 tablet (  1 mg total) by mouth 2 (two) times daily. 60 tablet 2  . amLODipine (NORVASC) 5 MG tablet Take 5 mg by mouth daily.    Marland Kitchen amphetamine-dextroamphetamine (ADDERALL) 30 MG tablet Take 1 tablet by mouth 2 (two) times daily. 60 tablet 0  . Cholecalciferol (VITAMIN D PO) Take 2,000 mg by mouth daily.    Marland Kitchen escitalopram (LEXAPRO) 20 MG tablet TAKE (1) TABLET TWICE DAILY. 60 tablet 2  . hydrochlorothiazide (HYDRODIURIL) 25 MG tablet Take 25 mg by mouth daily.    Marland Kitchen HYDROcodone-acetaminophen (NORCO) 10-325 MG per tablet Take 1 tablet by mouth 4 (four) times daily as needed.     Marland Kitchen levothyroxine (SYNTHROID, LEVOTHROID) 150 MCG  tablet Take 150 mcg by mouth daily before breakfast.    . MAGNESIUM CITRATE PO Take 250 mg by mouth daily.    . Multiple Vitamins-Minerals (MULTIVITAMIN WITH MINERALS) tablet Take 1 tablet by mouth daily.    . pantoprazole (PROTONIX) 40 MG tablet Take 40 mg by mouth daily.    Marland Kitchen POTASSIUM PO Take by mouth daily.    Marland Kitchen rOPINIRole (REQUIP) 2 MG tablet Take 2 mg by mouth 2 (two) times daily.    . vitamin C (ASCORBIC ACID) 500 MG tablet Take 1,000 mg by mouth daily.     . Chlorpheniramine Maleate (ALLERGY RELIEF PO) Take by mouth daily as needed.      No current facility-administered medications for this visit.    Allergies: Allergies  Allergen Reactions  . Abilify [Aripiprazole] Other (See Comments)    Very violent  . Gabapentin Other (See Comments)    Nightmares and talking to her nightmares  . Thorazine [Chlorpromazine] Other (See Comments)    Shakes, anxiety off the roof and panic attack  . Seroquel [Quetiapine Fumarate]    Medical History: Past Medical History:  Diagnosis Date  . Anxiety   . Bipolar disorder ([place])   . Depression   . DVT (deep venous thrombosis) ([place])   . Headache(784.0)   . Hypertension   . Hypothyroidism   . Obsessive-compulsive disorder   . Thyroid disease    Surgical History: Past Surgical History:  Procedure Laterality Date  . BACK SURGERY    . CHOLECYSTECTOMY    . SPINE SURGERY     Family History: family history includes ADD / ADHD in her daughter; Anxiety disorder in her daughter; Bipolar disorder in her daughter, maternal aunt, and mother; Diabetes in her father; Drug abuse in her daughter and daughter; Heart disease in her brother, father, and mother; Hyperlipidemia in her brother, father, and mother; Hypertension in her brother, father, mother, and sister; Suicidality in her maternal aunt. Reviewed and nothing new this visit.  Diagnosis:   Axis I: Bipolar, depressed Axis II: Deferred Axis III: Hypothyroidism, history of DVT  Axis IV: other  psychosocial or environmental problems Axis V: 18  ADL's:  Intact  Sleep: Poor  Appetite:  Good  Suicidal Ideation:  Pt denies any thoughts, plans, intent of suicide Homicidal Ideation:  Pt denies any thoughts, plans, intent of homicide  AEB (as evidenced by):per pt report  Mental Status Examination/Evaluation: Objective:  Appearance: Casual  Eye Contact::  Good  Speech:  Clear and Coherent  Volume:  Normal  Mood: Good   Affect:  Bright   Thought Process:  Coherent  Orientation:  Full  Thought Content:  WDL  Suicidal Thoughts:  No  Homicidal Thoughts:  No  Memory:  Immediate;   Poor Recent;   Fair Remote;   Poor  Judgement:  Good  Insight:  Fair  Psychomotor Activity:  Normal  Concentration:  Poor  Recall:  Poor  Akathisia:  No  Handed:  Left  AIMS (if indicated):     Assets:  Communication Skills Desire for Improvement  Sleep:   too much   Vitals: BP 125/67 (BP Location: Right Arm, Patient Position: Sitting, Cuff Size: Large)   Pulse 71   Ht 5\' 10"  (1.778 m)   Wt 238 lb 3.2 oz (108 kg)   BMI 34.18 kg/m   Lab Results:  No results found for this or any previous visit (from the past 8736 hour(s)).   Physical Findings: AIMS:  , ,  ,  ,    CIWA:    COWS:    Plan/Discussion: I took her vitals.  I reviewed CC, tobacco/med/surg Hx, meds effects/ side effects, problem list, therapies and responses as well as current situation/symptoms discussed options. She'llcontinue Xanax 1 mg twice a day as needed for anxiety and  Lexapro to 40 mg daily for depression. She will  Continue Adderall for ADHD . She'll return in 3 months but call me if her depression worsens  MEDICATIONS this encounter: Meds ordered this encounter  Medications  . rOPINIRole (REQUIP) 2 MG tablet    Sig: Take 2 mg by mouth 2 (two) times daily.  Marland Kitchen MAGNESIUM CITRATE PO    Sig: Take 250 mg by mouth daily.  Marland Kitchen POTASSIUM PO    Sig: Take by mouth daily.  . pantoprazole (PROTONIX) 40 MG tablet     Sig: Take 40 mg by mouth daily.  Marland Kitchen escitalopram (LEXAPRO) 20 MG tablet    Sig: TAKE (1) TABLET TWICE DAILY.    Dispense:  60 tablet    Refill:  2  . ALPRAZolam (XANAX) 1 MG tablet    Sig: Take 1 tablet (1 mg total) by mouth 2 (two) times daily.    Dispense:  60 tablet    Refill:  2    Medical Decision Making Problem Points:  Established problem, worsening (2), Review of last therapy session (1) and Review of psycho-social stressors (1) Data Points:  Review or order clinical lab tests (1) Review of medication regiment & side effects (2) Review of new medications or change in dosage (2)  I certify that outpatient services furnished can reasonably be expected to improve the patient's condition.   [name], MD

## 2016-07-11 ENCOUNTER — Ambulatory Visit (HOSPITAL_COMMUNITY): Payer: Medicaid Other

## 2016-07-11 ENCOUNTER — Other Ambulatory Visit (HOSPITAL_COMMUNITY): Payer: Self-pay

## 2016-07-17 NOTE — Progress Notes (Signed)
    PROGRESS NOTE Patient:  Jane [name]   DOB: May 21, 1966  MR Number: [id number]  Location: [facility] [address] [address] Dept: [phone]  Start: 1 PM End: 2 PM  Provider/Observer:     Edgardo Roys PSYD  Chief Complaint:      Chief Complaint  Patient presents with  . Anxiety  . Stress  . Agitation    Reason For Service:     the patient was referred because of issues associated with bipolar disorder and has been displaying manic episodes as well as symptoms of depression and social isolation.  The patient also has significant obsessive-compulsive disorder symptoms and other anxiety symptoms.  The patient has displayed poor coping skills, further exacerbated by her overall poor functioning.  Interventions Strategy:  Cognitive/behavioral psychotherapeutic interventions  Participation Level:   Active  Participation Quality:  Appropriate      Behavioral Observation:  Well Groomed, Alert, and Blunt.   Current Psychosocial Factors: The patient reports that she has continued to interact with more of her family.  Her sister with disabilities in [place] is having issues that she can not help with and that frustrates her.  Content of Session:   Review current symptoms and continued to work on therapeutic interventions for maintaining stable mood.  Current Status:   The patient reports that she continues to be making efforts at building coping skills and has been working on making better long term decisions.  She reports that depression has improved but that her anxiety has continued to be high with the deaths and everything to deal with in her family.  Mood has been stable and matching psychosocial features.  Patient Progress:   She reports that she had a very difficult last week with major stressors  including a major stressor of an abnormal signal found on her mammogram. A  biopsy was conducted and it was just found to be fatty tissue and not a tumor.  Target Goals:   Target goals include maintaining mood stability, maintaining the ability to live independently, and working on reducing the frequency, intensity, and duration of panic attacks.  Last Reviewed:   04/11/2016  Goals Addressed Today:    Today we worked on issues related to mood stability.  Impression/Diagnosis:   the patient has a long-standing history of bipolar disorder.  However, psychosocial issues and difficulty coping with a wide range of situations further impair our ability to function effectively in two key per psychiatric symptoms in check.  Diagnosis:    Axis I:  1. Major depressive disorder, recurrent episode, moderate ([place])    2. OCD (obsessive compulsive disorder)          Axis II: No diagnosis      RODENBOUGH,JOHN R, PsyD 07/17/2016

## 2016-07-23 ENCOUNTER — Ambulatory Visit (HOSPITAL_COMMUNITY): Payer: Self-pay | Admitting: Psychology

## 2016-08-03 ENCOUNTER — Other Ambulatory Visit (HOSPITAL_COMMUNITY): Payer: Self-pay | Admitting: Psychiatry

## 2016-08-08 ENCOUNTER — Ambulatory Visit (INDEPENDENT_AMBULATORY_CARE_PROVIDER_SITE_OTHER): Payer: Medicaid Other | Admitting: Psychology

## 2016-08-08 DIAGNOSIS — F331 Major depressive disorder, recurrent, moderate: Secondary | ICD-10-CM

## 2016-08-08 DIAGNOSIS — F429 Obsessive-compulsive disorder, unspecified: Secondary | ICD-10-CM

## 2016-08-09 ENCOUNTER — Encounter (HOSPITAL_COMMUNITY): Payer: Self-pay | Admitting: Psychology

## 2016-08-09 NOTE — Progress Notes (Signed)
    PROGRESS NOTE Patient:  Jane [name]   DOB: [birth year]  MR Number: [id number]  Location: [facility] [address] [place] [address] Dept: [phone]  Start: 1 PM End: 2 PM  Provider/Observer:     Edgardo Roys PSYD  Chief Complaint:      Chief Complaint  Patient presents with  . Agitation  . Anxiety  . Stress    Reason For Service:     the patient was referred because of issues associated with bipolar disorder and has been displaying manic episodes as well as symptoms of depression and social isolation.  The patient also has significant obsessive-compulsive disorder symptoms and other anxiety symptoms.  The patient has displayed poor coping skills, further exacerbated by her overall poor functioning.  Interventions Strategy:  Cognitive/behavioral psychotherapeutic interventions  Participation Level:   Active  Participation Quality:  Appropriate      Behavioral Observation:  Well Groomed, Alert, and Blunt.   Current Psychosocial Factors: The patient reports that she has continued to interact with more of her family.  Her sister with disabilities in [place] is having issues that she can not help with and that frustrates her.  Content of Session:   Review current symptoms and continued to work on therapeutic interventions for maintaining stable mood.  Current Status:   The patient reports that she continues to be making efforts at building coping skills and has been working on making better long term decisions.  She reports that depression has improved but that her anxiety has continued to be high with the deaths and everything to deal with in her family.  Mood has been stable and matching psychosocial features.  Patient Progress:   She reports that she had a very difficult last week with major stressors  including a major stressor of an abnormal signal found on her mammogram. A  biopsy was conducted and it was just found to be fatty tissue and not a tumor.  Target Goals:   Target goals include maintaining mood stability, maintaining the ability to live independently, and working on reducing the frequency, intensity, and duration of panic attacks.  Last Reviewed:   08/09/2016  Goals Addressed Today:    Today we worked on issues related to mood stability.  Impression/Diagnosis:   the patient has a long-standing history of bipolar disorder.  However, psychosocial issues and difficulty coping with a wide range of situations further impair our ability to function effectively in two key per psychiatric symptoms in check.  Diagnosis:    Axis I:  1. Major depressive disorder, recurrent episode, moderate ([facility])    2. Obsessive-compulsive disorder, unspecified type          Axis II: No diagnosis      [name] R, PsyD 08/09/2016

## 2016-08-28 ENCOUNTER — Ambulatory Visit (HOSPITAL_COMMUNITY): Payer: Self-pay | Admitting: Psychology

## 2016-10-01 ENCOUNTER — Ambulatory Visit (INDEPENDENT_AMBULATORY_CARE_PROVIDER_SITE_OTHER): Payer: Medicaid Other | Admitting: Psychology

## 2016-10-08 ENCOUNTER — Ambulatory Visit (HOSPITAL_COMMUNITY): Payer: Self-pay | Admitting: Psychiatry

## 2016-10-14 ENCOUNTER — Telehealth (HOSPITAL_COMMUNITY): Payer: Self-pay | Admitting: *Deleted

## 2016-10-14 ENCOUNTER — Encounter (HOSPITAL_COMMUNITY): Payer: Self-pay

## 2016-10-14 ENCOUNTER — Other Ambulatory Visit (HOSPITAL_COMMUNITY): Payer: Self-pay | Admitting: Psychiatry

## 2016-10-14 ENCOUNTER — Ambulatory Visit (HOSPITAL_COMMUNITY): Payer: Medicaid Other | Admitting: Psychiatry

## 2016-10-14 MED ORDER — AMPHETAMINE-DEXTROAMPHETAMINE 30 MG PO TABS: 30.0000 mg | ORAL_TABLET | Freq: Two times a day (BID) | ORAL | 0 refills | Status: DC

## 2016-10-14 MED ORDER — ESCITALOPRAM OXALATE 20 MG PO TABS: ORAL_TABLET | ORAL | 2 refills | Status: DC

## 2016-10-14 NOTE — Telephone Encounter (Signed)
lexapro sent. Adderall  Reprinted, you can mail it to her if she can't pick it up

## 2016-10-14 NOTE — Telephone Encounter (Signed)
Pt called stating transportation just called her stating they are not starting pickup until 10am and her appt is scheduled for today at 10:30am. Pt would like to be resch but is out of her medications. Informed pt that provider do not have any other openings until tomorrow morning and per pt she have no ride and transportation is telling her you have to put your request in 3 days before appt time. Informed pt provider's next opening is in January. Pt is willing to sch then but she would need refills before then. Pt number is [phone]. Pt is taking Lexapro.

## 2016-10-14 NOTE — Telephone Encounter (Signed)
LMTCB

## 2016-10-14 NOTE — Telephone Encounter (Signed)
Spoke with pt and she stated she will pick printed script up on Wednesday.

## 2016-10-15 ENCOUNTER — Ambulatory Visit (HOSPITAL_COMMUNITY): Payer: Self-pay | Admitting: Psychology

## 2016-10-16 ENCOUNTER — Ambulatory Visit (INDEPENDENT_AMBULATORY_CARE_PROVIDER_SITE_OTHER): Payer: Medicaid Other | Admitting: Psychology

## 2016-10-16 ENCOUNTER — Encounter (HOSPITAL_COMMUNITY): Payer: Self-pay | Admitting: *Deleted

## 2016-10-16 NOTE — Progress Notes (Signed)
Pt came into office for another provider and pt picked up printed script. Pt script is Adderall 30 mg. Order number is [id number] and script ID number is [id number]. Pt D/L number [id number] and expiration date is 07-31-18.

## 2016-11-12 ENCOUNTER — Ambulatory Visit (INDEPENDENT_AMBULATORY_CARE_PROVIDER_SITE_OTHER): Payer: Medicaid Other | Admitting: Psychiatry

## 2016-11-12 ENCOUNTER — Encounter (HOSPITAL_COMMUNITY): Payer: Self-pay | Admitting: Psychiatry

## 2016-11-12 VITALS — BP 131/71 | HR 84 | Ht 70.0 in | Wt 247.0 lb

## 2016-11-12 DIAGNOSIS — Z79899 Other long term (current) drug therapy: Secondary | ICD-10-CM

## 2016-11-12 DIAGNOSIS — F331 Major depressive disorder, recurrent, moderate: Secondary | ICD-10-CM

## 2016-11-12 DIAGNOSIS — Z888 Allergy status to other drugs, medicaments and biological substances status: Secondary | ICD-10-CM | POA: Diagnosis not present

## 2016-11-12 DIAGNOSIS — F3130 Bipolar disorder, current episode depressed, mild or moderate severity, unspecified: Secondary | ICD-10-CM | POA: Diagnosis not present

## 2016-11-12 MED ORDER — FLUOXETINE HCL 20 MG PO CAPS: 20.0000 mg | ORAL_CAPSULE | Freq: Every day | ORAL | 2 refills | Status: DC

## 2016-11-12 MED ORDER — ALPRAZOLAM 1 MG PO TABS: 1.0000 mg | ORAL_TABLET | Freq: Two times a day (BID) | ORAL | 2 refills | Status: DC

## 2016-11-12 MED ORDER — LEVOTHYROXINE SODIUM 175 MCG PO TABS: 175.0000 ug | ORAL_TABLET | Freq: Every day | ORAL | 2 refills | Status: DC

## 2016-11-12 NOTE — Progress Notes (Signed)
Patient ID: Dinna Mccalvin, female   DOB: 06-Sep-1966, 51 y.o.   MRN: [id number] Patient ID: Ariabella Kornblum, female   DOB: [birth year], 51 y.o.   MRN: [id number] Patient ID: Koren Maran, female   DOB: Apr 10, 1966, 51 y.o.   MRN: [id number] Patient ID: Rucha Hereford, female   DOB: 12-Jun-1966, 51 y.o.   MRN: [id number] Patient ID: Aubre Marts, female   DOB: 11-08-1965, 51 y.o.   MRN: [id number] Patient ID: Jesslynn Noa, female   DOB: 03-04-1966, 51 y.o.   MRN: [id number] Patient ID: Carrin Bartz, female   DOB: [birth year], 51 y.o.   MRN: [id number] Patient ID: Salley Olen, female   DOB: July 15, 1966, 51 y.o.   MRN: [id number] Patient ID: Editha Bontrager, female   DOB: 14-Jan-1966, 51 y.o.   MRN: [id number] Patient ID: Barbarella Wiker, female   DOB: [birth year], 51 y.o.   MRN: [id number] Patient ID: Kristianne Barsoum, female   DOB: 1966-08-25, 51 y.o.   MRN: [id number] Patient ID: Aundra Baisden, female   DOB: [birth year], 51 y.o.   MRN: [id number] Patient ID: Azia Tarkington, female   DOB: August 01, 1966, 51 y.o.   MRN: [id number] Patient ID: Markea Happ, female   DOB: 12/24/1965, 51 y.o.   MRN: [id number] Patient ID: Alaze Hardin, female   DOB: [birth year], 51 y.o.   MRN: [id number] Patient ID: Traniece Bernd, female   DOB: 31-Jan-1966, 41 y.o.   MRN: [id number] Patient ID: Elspeth Arensdorf, female   DOB: 1965-12-10, 51 y.o.   MRN: [id number] Patient ID: Tovah Loughridge, female   DOB: 1966/07/25, 51 y.o.   MRN: [id number] Patient ID: Rosene Arranaga, female   DOB: 25-Oct-1966, 51 y.o.   MRN: [id number] [facility] [id number] Progress Note Naileah Beissel MRN: [id number] DOB: 1966/04/04 Age: 51 y.o.  Date: 11/12/2016 Start Time: 1:45 PM End Time: 2:10 PM  Chief Complaint: Chief Complaint  Patient presents with  . Depression  . Anxiety  . Follow-up   Subjective: This patient is a 51 year-old divorced white female who lives alone in [place]. She is on disability due to bipolar disorder.  The patient states  that she became very depressed when her mother died in 02-17-2000 of a brain aneurysm. She started getting. Sad but eventually became manic and develop mood swings and agitated behavior. She's been on numerous medications over the years. In February 17, 1988 she also had postpartum depression. For many years she was on lithium but it caused a bad taste in her mouth and her doctor was worried it might be starting to affect her renal function so was discontinued. She also thinks that it stopped working after a number of years.  The patient returns after 4 months.  She states that she has been feeling very blah. She has little energy and is not sleeping well at night. She doesn't feel like getting much exercise and has gained about 20 pounds over the last year. Her mood is somewhat low as well. She is spending a lot of time with her grandchildren right now and this is helping her to feel better. We reviewed all the antidepressants she is taken and we couldn't find any listing for Prozac and this may help her energy more than the Lexapro. The Xanax continues to help her anxiety and she takes Adderall for ADD. She denies suicidal ideation .    Current Outpatient Prescriptions  Medication Sig Dispense Refill  . ALPRAZolam (XANAX) 1 MG tablet Take 1 tablet (1 mg total) by mouth 2 (two) times daily. 60 tablet 2  .  amLODipine (NORVASC) 5 MG tablet Take 5 mg by mouth daily.    Marland Kitchen amphetamine-dextroamphetamine (ADDERALL) 30 MG tablet Take 1 tablet by mouth 2 (two) times daily. 60 tablet 0  . Chlorpheniramine Maleate (ALLERGY RELIEF PO) Take by mouth daily as needed.     . Cholecalciferol (VITAMIN D PO) Take 2,000 mg by mouth daily.    . hydrochlorothiazide (HYDRODIURIL) 25 MG tablet Take 25 mg by mouth daily.    Marland Kitchen HYDROcodone-acetaminophen (NORCO) 10-325 MG per tablet Take 1 tablet by mouth 4 (four) times daily as needed.     Marland Kitchen MAGNESIUM CITRATE PO Take 250 mg by mouth daily.    . Multiple Vitamins-Minerals (MULTIVITAMIN WITH  MINERALS) tablet Take 1 tablet by mouth daily.    . pantoprazole (PROTONIX) 40 MG tablet Take 40 mg by mouth daily.    Marland Kitchen POTASSIUM PO Take by mouth daily.    Marland Kitchen rOPINIRole (REQUIP) 2 MG tablet Take 2 mg by mouth 2 (two) times daily.    . vitamin C (ASCORBIC ACID) 500 MG tablet Take 1,000 mg by mouth daily.     Marland Kitchen FLUoxetine (PROZAC) 20 MG capsule Take 1 capsule (20 mg total) by mouth daily. 30 capsule 2  . levothyroxine (SYNTHROID, LEVOTHROID) 175 MCG tablet Take 1 tablet (175 mcg total) by mouth daily before breakfast. 30 tablet 2   No current facility-administered medications for this visit.    Allergies: Allergies  Allergen Reactions  . Abilify [Aripiprazole] Other (See Comments)    Very violent  . Gabapentin Other (See Comments)    Nightmares and talking to her nightmares  . Thorazine [Chlorpromazine] Other (See Comments)    Shakes, anxiety off the roof and panic attack  . Seroquel [Quetiapine Fumarate]    Medical History: Past Medical History:  Diagnosis Date  . Anxiety   . Bipolar disorder ([place])   . Depression   . DVT (deep venous thrombosis) ([place])   . Headache(784.0)   . Hypertension   . Hypothyroidism   . Obsessive-compulsive disorder   . Thyroid disease    Surgical History: Past Surgical History:  Procedure Laterality Date  . BACK SURGERY    . CHOLECYSTECTOMY    . SPINE SURGERY     Family History: family history includes ADD / ADHD in her daughter; Anxiety disorder in her daughter; Bipolar disorder in her daughter, maternal aunt, and mother; Diabetes in her father; Drug abuse in her daughter and daughter; Heart disease in her brother, father, and mother; Hyperlipidemia in her brother, father, and mother; Hypertension in her brother, father, mother, and sister; Suicidality in her maternal aunt. Reviewed and nothing new this visit.  Diagnosis:   Axis I: Bipolar, depressed Axis II: Deferred Axis III: Hypothyroidism, history of DVT  Axis IV: other psychosocial or  environmental problems Axis V: 21  ADL's:  Intact  Sleep: Poor  Appetite:  Good  Suicidal Ideation:  Pt denies any thoughts, plans, intent of suicide Homicidal Ideation:  Pt denies any thoughts, plans, intent of homicide  AEB (as evidenced by):per pt report  Mental Status Examination/Evaluation: Objective:  Appearance: Casual  Eye Contact::  Good  Speech:  Clear and Coherent  Volume:  Normal  Mood: Somewhat depressed   Affect:  Dysphoric   Thought Process:  Coherent  Orientation:  Full  Thought Content:  WDL  Suicidal Thoughts:  No  Homicidal Thoughts:  No  Memory:  Immediate;   Poor Recent;   Fair Remote;   Poor  Judgement:  Good  Insight:  Fair  Psychomotor Activity:  Normal  Concentration:  Poor  Recall:  Poor  Akathisia:  No  Handed:  Left  AIMS (if indicated):     Assets:  Communication Skills Desire for Improvement  Sleep:   too much   Vitals: BP 131/71   Pulse 84   Ht 5\' 10"  (1.778 m)   Wt 247 lb (112 kg)   BMI 35.44 kg/m   Lab Results:  No results found for this or any previous visit (from the past 8736 hour(s)).   Physical Findings: AIMS:  , ,  ,  ,    CIWA:    COWS:    Plan/Discussion: I took her vitals.  I reviewed CC, tobacco/med/surg Hx, meds effects/ side effects, problem list, therapies and responses as well as current situation/symptoms discussed options. She'llcontinue Xanax 1 mg twice a day as needed for anxiety . Will discontinue Lexapro and start Prozac 20 mg daily. She will  Continue Adderall for ADHD . She'll return in 4 weeks but call me if her depression worsens  MEDICATIONS this encounter: Meds ordered this encounter  Medications  . levothyroxine (SYNTHROID, LEVOTHROID) 175 MCG tablet    Sig: Take 1 tablet (175 mcg total) by mouth daily before breakfast.    Dispense:  30 tablet    Refill:  2  . FLUoxetine (PROZAC) 20 MG capsule    Sig: Take 1 capsule (20 mg total) by mouth daily.    Dispense:  30 capsule    Refill:  2   . ALPRAZolam (XANAX) 1 MG tablet    Sig: Take 1 tablet (1 mg total) by mouth 2 (two) times daily.    Dispense:  60 tablet    Refill:  2    Medical Decision Making Problem Points:  Established problem, worsening (2), Review of last therapy session (1) and Review of psycho-social stressors (1) Data Points:  Review or order clinical lab tests (1) Review of medication regiment & side effects (2) Review of new medications or change in dosage (2)  I certify that outpatient services furnished can reasonably be expected to improve the patient's condition.   [name], MD

## 2016-11-14 ENCOUNTER — Encounter (INDEPENDENT_AMBULATORY_CARE_PROVIDER_SITE_OTHER): Payer: Self-pay

## 2016-11-14 ENCOUNTER — Ambulatory Visit (INDEPENDENT_AMBULATORY_CARE_PROVIDER_SITE_OTHER): Payer: Medicaid Other | Admitting: Psychology

## 2016-11-14 DIAGNOSIS — F3160 Bipolar disorder, current episode mixed, unspecified: Secondary | ICD-10-CM

## 2016-11-14 DIAGNOSIS — F429 Obsessive-compulsive disorder, unspecified: Secondary | ICD-10-CM

## 2016-11-15 NOTE — Progress Notes (Signed)
    PROGRESS NOTE Patient:  Jane [name]   DOB: 1966/07/20  MR Number: [id number]  Location: [facility] [address] [place] [address] Dept: [phone]  Start: 1 PM End: 2 PM  Provider/Observer:     Edgardo Roys PSYD  Chief Complaint:      Chief Complaint  Patient presents with  . Agitation  . Anxiety  . Stress    Reason For Service:     the patient was referred because of issues associated with bipolar disorder and has been displaying manic episodes as well as symptoms of depression and social isolation.  The patient also has significant obsessive-compulsive disorder symptoms and other anxiety symptoms.  The patient has displayed poor coping skills, further exacerbated by her overall poor functioning.  Interventions Strategy:  Cognitive/behavioral psychotherapeutic interventions  Participation Level:   Active  Participation Quality:  Appropriate      Behavioral Observation:  Well Groomed, Alert, and Blunt.   Current Psychosocial Factors: The patient reports that she has continued to interact with more of her family.  Her sister with disabilities in [place] is having issues that she can not help with and that frustrates her.  Content of Session:   Review current symptoms and continued to work on therapeutic interventions for maintaining stable mood.  Current Status:   The patient reports that she continues to be making efforts at building coping skills and has been working on making better long term decisions.  She reports that depression has improved but that her anxiety has continued to be high with the deaths and everything to deal with in her family.  Mood has been stable and matching psychosocial features.  Patient Progress:   She reports that she had a very difficult last week with major stressors  including a major stressor of an abnormal signal found on her mammogram. A  biopsy was conducted and it was just found to be fatty tissue and not a tumor.  Target Goals:   Target goals include maintaining mood stability, maintaining the ability to live independently, and working on reducing the frequency, intensity, and duration of panic attacks.  Last Reviewed:   11/15/2015  Goals Addressed Today:    Today we worked on issues related to mood stability.  Impression/Diagnosis:   the patient has a long-standing history of bipolar disorder.  However, psychosocial issues and difficulty coping with a wide range of situations further impair our ability to function effectively in two key per psychiatric symptoms in check.  Diagnosis:    Axis I:  1. Obsessive-compulsive disorder, unspecified type    2. Bipolar I disorder, most recent episode mixed ([place])          Axis II: No diagnosis      Abrea Henle R, PsyD 11/15/2016

## 2016-12-05 ENCOUNTER — Other Ambulatory Visit (HOSPITAL_COMMUNITY): Payer: Self-pay | Admitting: Psychiatry

## 2016-12-09 ENCOUNTER — Ambulatory Visit (INDEPENDENT_AMBULATORY_CARE_PROVIDER_SITE_OTHER): Payer: Medicaid Other | Admitting: Psychiatry

## 2016-12-09 ENCOUNTER — Encounter (HOSPITAL_COMMUNITY): Payer: Self-pay | Admitting: Psychiatry

## 2016-12-09 VITALS — BP 139/88 | HR 78 | Ht 70.0 in | Wt 241.8 lb

## 2016-12-09 DIAGNOSIS — Z79899 Other long term (current) drug therapy: Secondary | ICD-10-CM | POA: Diagnosis not present

## 2016-12-09 DIAGNOSIS — Z9889 Other specified postprocedural states: Secondary | ICD-10-CM

## 2016-12-09 DIAGNOSIS — Z9049 Acquired absence of other specified parts of digestive tract: Secondary | ICD-10-CM | POA: Diagnosis not present

## 2016-12-09 DIAGNOSIS — F429 Obsessive-compulsive disorder, unspecified: Secondary | ICD-10-CM | POA: Diagnosis not present

## 2016-12-09 DIAGNOSIS — F3160 Bipolar disorder, current episode mixed, unspecified: Secondary | ICD-10-CM | POA: Diagnosis not present

## 2016-12-09 DIAGNOSIS — Z888 Allergy status to other drugs, medicaments and biological substances status: Secondary | ICD-10-CM

## 2016-12-09 MED ORDER — ALPRAZOLAM 1 MG PO TABS: 1.0000 mg | ORAL_TABLET | Freq: Two times a day (BID) | ORAL | 2 refills | Status: DC

## 2016-12-09 MED ORDER — AMPHETAMINE-DEXTROAMPHETAMINE 30 MG PO TABS: 30.0000 mg | ORAL_TABLET | Freq: Two times a day (BID) | ORAL | 0 refills | Status: DC

## 2016-12-09 MED ORDER — DULOXETINE HCL 60 MG PO CPEP: 60.0000 mg | ORAL_CAPSULE | Freq: Every day | ORAL | 2 refills | Status: DC

## 2016-12-09 NOTE — Progress Notes (Signed)
Patient ID: Jane [name], female   DOB: [birth year], 51 y.o.   MRN: [id number] Patient ID: Jane [name], female   DOB: Mar 26, 1966, 51 y.o.   MRN: [id number] Patient ID: Jane [name], female   DOB: 08/01/1966, 51 y.o.   MRN: [id number] Patient ID: Jane [name], female   DOB: Sep 20, 1966, 51 y.o.   MRN: [id number] Patient ID: Jane [name], female   DOB: June 24, 1966, 51 y.o.   MRN: [id number] Patient ID: Jane [name], female   DOB: [birth year], 51 y.o.   MRN: [id number] Patient ID: Jane [name], female   DOB: 1965/12/27, 52 y.o.   MRN: [id number] Patient ID: Jane [name], female   DOB: [birth year], 51 y.o.   MRN: [id number] Patient ID: Jane [name], female   DOB: [birth year], 51 y.o.   MRN: [id number] Patient ID: Jane [name], female   DOB: 04-Jan-1966, 51 y.o.   MRN: [id number] Patient ID: Jane [name], female   DOB: Dec 13, 1965, 51 y.o.   MRN: [id number] Patient ID: Jane [name], female   DOB: February 13, 1966, 51 y.o.   MRN: [id number] Patient ID: Jane [name], female   DOB: 01-19-1966, 51 y.o.   MRN: [id number] Patient ID: Jane [name], female   DOB: 1966-03-31, 51 y.o.   MRN: [id number] Patient ID: Jane [name], female   DOB: [birth year], 51 y.o.   MRN: [id number] Patient ID: Jane [name], female   DOB: [birth year], 51 y.o.   MRN: [id number] Patient ID: Jane [name], female   DOB: 1966-04-18, 51 y.o.   MRN: [id number] Patient ID: Jane [name], female   DOB: Sep 24, 1966, 51 y.o.   MRN: [id number] Patient ID: Jane [name], female   DOB: Dec 07, 1965, 51 y.o.   MRN: [id number] [facility] [id number] Progress Note Jane [name] MRN: [id number] DOB: 02-Aug-1966 Age: 51 y.o.  Date: 12/09/2016 Start Time: 1:45 PM End Time: 2:10 PM  Chief Complaint: Chief Complaint  Patient presents with  . Depression  . Anxiety  . Follow-up   Subjective: This patient is a 51 year-old divorced white female who lives alone in [place]. She is on disability due to bipolar disorder.  The patient states  that she became very depressed when her mother died in 02/10/00 of a brain aneurysm. She started getting. Sad but eventually became manic and develop mood swings and agitated behavior. She's been on numerous medications over the years. In 10-Feb-1988 she also had postpartum depression. For many years she was on lithium but it caused a bad taste in her mouth and her doctor was worried it might be starting to affect her renal function so was discontinued. She also thinks that it stopped working after a number of years.  The patient returns after 4 weeks. Last time we changed her to Prozac but she still doesn't feel much better. She's having a lot of joint pain and hair loss. She has been feeling low ever since her hysterectomy in October. Her gynecologist refuses to check her hormones. I said just did she have an ANA test for lupus distant case because of the hair loss and joint pain. I also suggested that we change her to Cymbalta as she is having both depression and chronic pain. Xanax continues to help her anxiety and Adderall continues to help her focus .    Current Outpatient Prescriptions  Medication Sig Dispense Refill  . ALPRAZolam (XANAX) 1 MG tablet Take 1 tablet (1 mg total) by mouth 2 (two) times daily. 60 tablet 2  . amLODipine (NORVASC) 5 MG tablet Take 5 mg by mouth daily.    Marland Kitchen  amphetamine-dextroamphetamine (ADDERALL) 30 MG tablet Take 1 tablet by mouth 2 (two) times daily. 60 tablet 0  . Chlorpheniramine Maleate (ALLERGY RELIEF PO) Take by mouth daily as needed.     . Cholecalciferol (VITAMIN D PO) Take 2,000 mg by mouth daily.    . hydrochlorothiazide (HYDRODIURIL) 25 MG tablet Take 25 mg by mouth daily.    Marland Kitchen HYDROcodone-acetaminophen (NORCO) 10-325 MG per tablet Take 1 tablet by mouth 4 (four) times daily as needed.     Marland Kitchen levothyroxine (SYNTHROID, LEVOTHROID) 175 MCG tablet Take 1 tablet (175 mcg total) by mouth daily before breakfast. 30 tablet 2  . MAGNESIUM CITRATE PO Take 250 mg by mouth  daily.    . Multiple Vitamins-Minerals (MULTIVITAMIN WITH MINERALS) tablet Take 1 tablet by mouth daily.    . pantoprazole (PROTONIX) 40 MG tablet Take 40 mg by mouth daily.    Marland Kitchen POTASSIUM PO Take by mouth daily.    Marland Kitchen rOPINIRole (REQUIP) 2 MG tablet Take 2 mg by mouth 2 (two) times daily.    . vitamin C (ASCORBIC ACID) 500 MG tablet Take 1,000 mg by mouth daily.     . DULoxetine (CYMBALTA) 60 MG capsule Take 1 capsule (60 mg total) by mouth daily. 30 capsule 2   No current facility-administered medications for this visit.    Allergies: Allergies  Allergen Reactions  . Abilify [Aripiprazole] Other (See Comments)    Very violent  . Gabapentin Other (See Comments)    Nightmares and talking to her nightmares  . Thorazine [Chlorpromazine] Other (See Comments)    Shakes, anxiety off the roof and panic attack  . Seroquel [Quetiapine Fumarate]    Medical History: Past Medical History:  Diagnosis Date  . Anxiety   . Bipolar disorder ([place])   . Depression   . DVT (deep venous thrombosis) ([place])   . Headache(784.0)   . Hypertension   . Hypothyroidism   . Obsessive-compulsive disorder   . Thyroid disease    Surgical History: Past Surgical History:  Procedure Laterality Date  . BACK SURGERY    . CHOLECYSTECTOMY    . SPINE SURGERY     Family History: family history includes ADD / ADHD in her daughter; Anxiety disorder in her daughter; Bipolar disorder in her daughter, maternal aunt, and mother; Diabetes in her father; Drug abuse in her daughter and daughter; Heart disease in her brother, father, and mother; Hyperlipidemia in her brother, father, and mother; Hypertension in her brother, father, mother, and sister; Suicidality in her maternal aunt. Reviewed and nothing new this visit.  Diagnosis:   Axis I: Bipolar, depressed Axis II: Deferred Axis III: Hypothyroidism, history of DVT  Axis IV: other psychosocial or environmental problems Axis V: 17  ADL's:  Intact  Sleep:  Poor  Appetite:  Good  Suicidal Ideation:  Pt denies any thoughts, plans, intent of suicide Homicidal Ideation:  Pt denies any thoughts, plans, intent of homicide  AEB (as evidenced by):per pt report  Mental Status Examination/Evaluation: Objective:  Appearance: Casual  Eye Contact::  Good  Speech:  Clear and Coherent  Volume:  Normal  Mood: Depressed and tired   Affect:  Dysphoric   Thought Process:  Coherent  Orientation:  Full  Thought Content:  WDL  Suicidal Thoughts:  No  Homicidal Thoughts:  No  Memory:  Immediate;   Poor Recent;   Fair Remote;   Poor  Judgement:  Good  Insight:  Fair  Psychomotor Activity:  Normal  Concentration:  Poor  Recall:  Poor  Akathisia:  No  Handed:  Left  AIMS (if indicated):     Assets:  Communication Skills Desire for Improvement  Sleep:   too much   Vitals: BP 139/88   Pulse 78   Ht 5\' 10"  (1.778 m)   Wt 241 lb 12.8 oz (109.7 kg)   BMI 34.69 kg/m   Lab Results:  No results found for this or any previous visit (from the past 8736 hour(s)).   Physical Findings: AIMS:  , ,  ,  ,    CIWA:    COWS:    Plan/Discussion: I took her vitals.  I reviewed CC, tobacco/med/surg Hx, meds effects/ side effects, problem list, therapies and responses as well as current situation/symptoms discussed options. She'llcontinue Xanax 1 mg twice a day as needed for anxiety . Will discontinue Prozac and start Cymbalta 60 mg daily. She will  Continue Adderall for ADHD . She'll return in 4 weeks but call me if her depression worsens We will check ANA MEDICATIONS this encounter: Meds ordered this encounter  Medications  . DULoxetine (CYMBALTA) 60 MG capsule    Sig: Take 1 capsule (60 mg total) by mouth daily.    Dispense:  30 capsule    Refill:  2  . ALPRAZolam (XANAX) 1 MG tablet    Sig: Take 1 tablet (1 mg total) by mouth 2 (two) times daily.    Dispense:  60 tablet    Refill:  2  . amphetamine-dextroamphetamine (ADDERALL) 30 MG tablet     Sig: Take 1 tablet by mouth 2 (two) times daily.    Dispense:  60 tablet    Refill:  0    Medical Decision Making Problem Points:  Established problem, worsening (2), Review of last therapy session (1) and Review of psycho-social stressors (1) Data Points:  Review or order clinical lab tests (1) Review of medication regiment & side effects (2) Review of new medications or change in dosage (2)  I certify that outpatient services furnished can reasonably be expected to improve the patient's condition.   [name], MD

## 2016-12-11 LAB — ANA: Anti Nuclear Antibody(ANA): NEGATIVE

## 2016-12-13 ENCOUNTER — Telehealth (HOSPITAL_COMMUNITY): Payer: Self-pay | Admitting: *Deleted

## 2016-12-13 NOTE — Telephone Encounter (Signed)
Please tell her it was normal

## 2016-12-13 NOTE — Telephone Encounter (Signed)
Called pt number on file and her name was on voicemail. Informed pt on voicemail that her test result was normal.

## 2016-12-13 NOTE — Telephone Encounter (Signed)
Pt called stating she would like for Dr. Harrington Challenger to call her. Per pt she need to tell Dr. Harrington Challenger her blood test results Dr. Harrington Challenger told her to get for her. Pt number is [phone]

## 2016-12-30 ENCOUNTER — Other Ambulatory Visit (HOSPITAL_COMMUNITY): Payer: Self-pay | Admitting: Pulmonary Disease

## 2016-12-30 DIAGNOSIS — R200 Anesthesia of skin: Secondary | ICD-10-CM

## 2016-12-30 DIAGNOSIS — M542 Cervicalgia: Secondary | ICD-10-CM

## 2017-01-06 ENCOUNTER — Ambulatory Visit (INDEPENDENT_AMBULATORY_CARE_PROVIDER_SITE_OTHER): Payer: Medicaid Other | Admitting: Psychiatry

## 2017-01-06 ENCOUNTER — Encounter (HOSPITAL_COMMUNITY): Payer: Self-pay | Admitting: Psychiatry

## 2017-01-06 ENCOUNTER — Ambulatory Visit (HOSPITAL_COMMUNITY): Payer: Medicaid Other

## 2017-01-06 VITALS — BP 136/78 | HR 97 | Ht 70.0 in | Wt 238.0 lb

## 2017-01-06 DIAGNOSIS — F429 Obsessive-compulsive disorder, unspecified: Secondary | ICD-10-CM | POA: Diagnosis not present

## 2017-01-06 DIAGNOSIS — Z888 Allergy status to other drugs, medicaments and biological substances status: Secondary | ICD-10-CM | POA: Diagnosis not present

## 2017-01-06 DIAGNOSIS — F3160 Bipolar disorder, current episode mixed, unspecified: Secondary | ICD-10-CM

## 2017-01-06 DIAGNOSIS — Z79899 Other long term (current) drug therapy: Secondary | ICD-10-CM

## 2017-01-06 MED ORDER — DULOXETINE HCL 60 MG PO CPEP: 60.0000 mg | ORAL_CAPSULE | Freq: Every day | ORAL | 2 refills | Status: DC

## 2017-01-06 MED ORDER — ALPRAZOLAM 1 MG PO TABS: 1.0000 mg | ORAL_TABLET | Freq: Two times a day (BID) | ORAL | 2 refills | Status: DC

## 2017-01-06 MED ORDER — AMPHETAMINE-DEXTROAMPHETAMINE 30 MG PO TABS
30.0000 mg | ORAL_TABLET | Freq: Two times a day (BID) | ORAL | 0 refills | Status: DC
Start: 2017-01-06 — End: 2017-03-06

## 2017-01-06 MED ORDER — AMPHETAMINE-DEXTROAMPHETAMINE 30 MG PO TABS: 30.0000 mg | ORAL_TABLET | Freq: Two times a day (BID) | ORAL | 0 refills | Status: DC

## 2017-01-06 NOTE — Progress Notes (Signed)
Patient ID: Tamicia Bellar, female   DOB: 08-21-1966, 51 y.o.   MRN: [id number] Patient ID: Liberty Ruscitto, female   DOB: August 11, 1966, 51 y.o.   MRN: [id number] Patient ID: Afton Garvie, female   DOB: [birth year], 51 y.o.   MRN: [id number] Patient ID: Kamya Reineke, female   DOB: 1966-08-22, 51 y.o.   MRN: [id number] Patient ID: Kimra Leibfried, female   DOB: 28-Jul-1966, 51 y.o.   MRN: [id number] Patient ID: Reonna Wheeless, female   DOB: November 21, 1965, 51 y.o.   MRN: [id number] Patient ID: Kaytlan Terzi, female   DOB: 26-Dec-1965, 51 y.o.   MRN: [id number] Patient ID: Ellenmarie Wider, female   DOB: [birth year], 51 y.o.   MRN: [id number] Patient ID: Avanell Cajina, female   DOB: 06/09/1966, 51 y.o.   MRN: [id number] Patient ID: Anney Hilgeman, female   DOB: May 31, 1966, 51 y.o.   MRN: [id number] Patient ID: Katori Dalpiaz, female   DOB: 07-26-1966, 51 y.o.   MRN: [id number] Patient ID: Rhiyan Umland, female   DOB: 01-13-1966, 51 y.o.   MRN: [id number] Patient ID: Gelene Postle, female   DOB: May 18, 1966, 51 y.o.   MRN: [id number] Patient ID: Deedee Covin, female   DOB: Jan 06, 1966, 51 y.o.   MRN: [id number] Patient ID: Shikita Pickrel, female   DOB: 11-17-1965, 51 y.o.   MRN: [id number] Patient ID: Mauna Glazewski, female   DOB: 1966-05-10, 51 y.o.   MRN: [id number] Patient ID: Michiko Mandler, female   DOB: 28-Jan-1966, 51 y.o.   MRN: [id number] Patient ID: Talani Wilmes, female   DOB: 04/17/1966, 51 y.o.   MRN: [id number] Patient ID: Mckalyn Willenberg, female   DOB: [birth year], 51 y.o.   MRN: [id number] [facility] [address] Progress Note Aneesa Borkowski MRN: [id number] DOB: 05-30-1966 Age: 51 y.o.  Date: 01/06/2017 Start Time: 1:45 PM End Time: 2:10 PM  Chief Complaint: Chief Complaint  Patient presents with  . Depression  . Anxiety  . Follow-up   Subjective: This patient is a 51 year-old divorced white female who lives alone in [place]. She is on disability due to bipolar disorder.  The patient states  that she became very depressed when her mother died in 02-26-2000 of a brain aneurysm. She started getting. Sad but eventually became manic and develop mood swings and agitated behavior. She's been on numerous medications over the years. In 02/26/1988 she also had postpartum depression. For many years she was on lithium but it caused a bad taste in her mouth and her doctor was worried it might be starting to affect her renal function so was discontinued. She also thinks that it stopped working after a number of years.  The patient returns after 4 weeks. Last time we changed her to Cymbalta. She is doing a lot better her hair loss is stopped and her joint pain is improved. She is taking online classes and has joined [employer]. So far she has lost 6 pounds. She seems to have a lot more energy. She is focusing well and her anxiety is under good control .    Current Outpatient Prescriptions  Medication Sig Dispense Refill  . ALPRAZolam (XANAX) 1 MG tablet Take 1 tablet (1 mg total) by mouth 2 (two) times daily. 60 tablet 2  . amLODipine (NORVASC) 5 MG tablet Take 5 mg by mouth daily.    Marland Kitchen amphetamine-dextroamphetamine (ADDERALL) 30 MG tablet Take 1 tablet by mouth 2 (two) times daily. 60 tablet 0  . aspirin-acetaminophen-caffeine (EXCEDRIN MIGRAINE) 250-250-65 MG tablet Take by mouth every 6 (six) hours as  needed for headache.    . cyclobenzaprine (FLEXERIL) 10 MG tablet Take 10 mg by mouth daily as needed for muscle spasms.    . cycloSPORINE (RESTASIS) 0.05 % ophthalmic emulsion Place 1 drop into both eyes 2 (two) times daily.    . furosemide (LASIX) 20 MG tablet Take 20 mg by mouth daily as needed.    . hydrochlorothiazide (HYDRODIURIL) 25 MG tablet Take 25 mg by mouth daily.    Marland Kitchen HYDROcodone-acetaminophen (NORCO) 10-325 MG per tablet Take 1 tablet by mouth 4 (four) times daily as needed.     Marland Kitchen levothyroxine (SYNTHROID, LEVOTHROID) 175 MCG tablet Take 1 tablet (175 mcg total) by mouth daily before  breakfast. 30 tablet 2  . MAGNESIUM CITRATE PO Take 250 mg by mouth daily.    . Melatonin 5 MG TABS Take 10 mg by mouth at bedtime.    . Multiple Vitamins-Minerals (MULTIVITAMIN WITH MINERALS) tablet Take 1 tablet by mouth daily.    . pantoprazole (PROTONIX) 40 MG tablet Take 40 mg by mouth daily.    Marland Kitchen POTASSIUM PO Take by mouth daily.    Marland Kitchen rOPINIRole (REQUIP) 2 MG tablet Take 2 mg by mouth 2 (two) times daily.    . vitamin C (ASCORBIC ACID) 500 MG tablet Take 1,000 mg by mouth daily.     Marland Kitchen amphetamine-dextroamphetamine (ADDERALL) 30 MG tablet Take 1 tablet by mouth 2 (two) times daily. 60 tablet 0  . Chlorpheniramine Maleate (ALLERGY RELIEF PO) Take by mouth daily as needed.     . Cholecalciferol (VITAMIN D PO) Take 2,000 mg by mouth daily.    . DULoxetine (CYMBALTA) 60 MG capsule Take 1 capsule (60 mg total) by mouth daily. 30 capsule 2   No current facility-administered medications for this visit.    Allergies: Allergies  Allergen Reactions  . Abilify [Aripiprazole] Other (See Comments)    Very violent  . Gabapentin Other (See Comments)    Nightmares and talking to her nightmares  . Thorazine [Chlorpromazine] Other (See Comments)    Shakes, anxiety off the roof and panic attack  . Seroquel [Quetiapine Fumarate]    Medical History: Past Medical History:  Diagnosis Date  . Anxiety   . Bipolar disorder ([place])   . Depression   . DVT (deep venous thrombosis) ([place])   . Headache(784.0)   . Hypertension   . Hypothyroidism   . Obsessive-compulsive disorder   . Thyroid disease    Surgical History: Past Surgical History:  Procedure Laterality Date  . BACK SURGERY    . CHOLECYSTECTOMY    . SPINE SURGERY     Family History: family history includes ADD / ADHD in her daughter; Anxiety disorder in her daughter; Bipolar disorder in her daughter, maternal aunt, and mother; Diabetes in her father; Drug abuse in her daughter and daughter; Heart disease in her brother, father, and mother;  Hyperlipidemia in her brother, father, and mother; Hypertension in her brother, father, mother, and sister; Suicidality in her maternal aunt. Reviewed and nothing new this visit.  Diagnosis:   Axis I: Bipolar, depressed Axis II: Deferred Axis III: Hypothyroidism, history of DVT  Axis IV: other psychosocial or environmental problems Axis V: 55  ADL's:  Intact  Sleep: Poor  Appetite:  Good  Suicidal Ideation:  Pt denies any thoughts, plans, intent of suicide Homicidal Ideation:  Pt denies any thoughts, plans, intent of homicide  AEB (as evidenced by):per pt report  Mental Status Examination/Evaluation: Objective:  Appearance: Casual  Eye Contact::  Good  Speech:  Clear and Coherent  Volume:  Normal  Mood: Good   Affect:  Bright   Thought Process:  Coherent  Orientation:  Full  Thought Content:  WDL  Suicidal Thoughts:  No  Homicidal Thoughts:  No  Memory:  Immediate;   Poor Recent;   Fair Remote;   Poor  Judgement:  Good  Insight:  Fair  Psychomotor Activity:  Normal  Concentration:  Poor  Recall:  Poor  Akathisia:  No  Handed:  Left  AIMS (if indicated):     Assets:  Communication Skills Desire for Improvement  Sleep:   too much   Vitals: BP 136/78 (BP Location: Right Arm, Patient Position: Sitting, Cuff Size: Normal)   Pulse 97   Ht 5\' 10"  (1.778 m)   Wt 238 lb (108 kg)   BMI 34.15 kg/m   Lab Results:  Results for orders placed or performed in visit on 12/09/16 (from the past 8736 hour(s))  Antinuclear Antib (ANA)   Collection Time: 12/09/16 12:02 PM  Result Value Ref Range   Anit Nuclear Antibody(ANA) NEG NEGATIVE     Physical Findings: AIMS:  , ,  ,  ,    CIWA:    COWS:    Plan/Discussion: I took her vitals.  I reviewed CC, tobacco/med/surg Hx, meds effects/ side effects, problem list, therapies and responses as well as current situation/symptoms discussed options. She'llcontinue Xanax 1 mg twice a day as needed for anxiety   and start  Cymbalta 60 mg daily. She will  Continue Adderall for ADHD . She'll return in 2 months but call me if her depression worsens We will check ANA MEDICATIONS this encounter: Meds ordered this encounter  Medications  . cyclobenzaprine (FLEXERIL) 10 MG tablet    Sig: Take 10 mg by mouth daily as needed for muscle spasms.  Marland Kitchen aspirin-acetaminophen-caffeine (EXCEDRIN MIGRAINE) 250-250-65 MG tablet    Sig: Take by mouth every 6 (six) hours as needed for headache.  . Melatonin 5 MG TABS    Sig: Take 10 mg by mouth at bedtime.  . cycloSPORINE (RESTASIS) 0.05 % ophthalmic emulsion    Sig: Place 1 drop into both eyes 2 (two) times daily.  . furosemide (LASIX) 20 MG tablet    Sig: Take 20 mg by mouth daily as needed.  . DULoxetine (CYMBALTA) 60 MG capsule    Sig: Take 1 capsule (60 mg total) by mouth daily.    Dispense:  30 capsule    Refill:  2  . amphetamine-dextroamphetamine (ADDERALL) 30 MG tablet    Sig: Take 1 tablet by mouth 2 (two) times daily.    Dispense:  60 tablet    Refill:  0  . amphetamine-dextroamphetamine (ADDERALL) 30 MG tablet    Sig: Take 1 tablet by mouth 2 (two) times daily.    Dispense:  60 tablet    Refill:  0    Fill after 02/06/17  . ALPRAZolam (XANAX) 1 MG tablet    Sig: Take 1 tablet (1 mg total) by mouth 2 (two) times daily.    Dispense:  60 tablet    Refill:  2    Medical Decision Making Problem Points:  Established problem, worsening (2), Review of last therapy session (1) and Review of psycho-social stressors (1) Data Points:  Review or order clinical lab tests (1) Review of medication regiment & side effects (2) Review of new medications or change in dosage (2)  I certify that outpatient services furnished can reasonably be expected  to improve the patient's condition.   [name], MD

## 2017-01-08 ENCOUNTER — Ambulatory Visit (HOSPITAL_COMMUNITY)
Admission: RE | Admit: 2017-01-08 | Discharge: 2017-01-08 | Disposition: A | Discharge status: Home / Self Care | Payer: Medicaid Other | Source: Ambulatory Visit | Attending: Pulmonary Disease | Admitting: Pulmonary Disease

## 2017-01-08 DIAGNOSIS — M5031 Other cervical disc degeneration,  high cervical region: Secondary | ICD-10-CM | POA: Diagnosis not present

## 2017-01-08 DIAGNOSIS — R200 Anesthesia of skin: Secondary | ICD-10-CM

## 2017-01-08 DIAGNOSIS — M50222 Other cervical disc displacement at C5-C6 level: Secondary | ICD-10-CM | POA: Diagnosis not present

## 2017-01-08 DIAGNOSIS — M542 Cervicalgia: Secondary | ICD-10-CM

## 2017-01-08 DIAGNOSIS — R202 Paresthesia of skin: Secondary | ICD-10-CM | POA: Diagnosis not present

## 2017-02-14 ENCOUNTER — Other Ambulatory Visit: Payer: Self-pay | Admitting: Nurse Practitioner

## 2017-02-14 DIAGNOSIS — M5126 Other intervertebral disc displacement, lumbar region: Secondary | ICD-10-CM

## 2017-02-18 ENCOUNTER — Ambulatory Visit
Admission: RE | Admit: 2017-02-18 | Discharge: 2017-02-18 | Disposition: A | Discharge status: Home / Self Care | Payer: Medicaid Other | Source: Ambulatory Visit | Attending: Nurse Practitioner | Admitting: Nurse Practitioner

## 2017-02-18 VITALS — BP 140/83 | HR 69

## 2017-02-18 DIAGNOSIS — M5126 Other intervertebral disc displacement, lumbar region: Secondary | ICD-10-CM

## 2017-02-18 DIAGNOSIS — M5410 Radiculopathy, site unspecified: Secondary | ICD-10-CM

## 2017-02-18 MED ORDER — METHYLPREDNISOLONE ACETATE 40 MG/ML INJ SUSP (RADIOLOG
120.0000 mg | Freq: Once | INTRAMUSCULAR | Status: AC
  Administered 2017-02-18: 120 mg via EPIDURAL

## 2017-02-18 MED ORDER — IOPAMIDOL (ISOVUE-M 200) INJECTION 41%
1.0000 mL | Freq: Once | INTRAMUSCULAR | Status: AC
  Administered 2017-02-18: 1 mL via EPIDURAL

## 2017-02-18 NOTE — Discharge Instructions (Signed)

## 2017-03-06 ENCOUNTER — Ambulatory Visit (INDEPENDENT_AMBULATORY_CARE_PROVIDER_SITE_OTHER): Payer: Medicaid Other | Admitting: Psychiatry

## 2017-03-06 ENCOUNTER — Encounter (HOSPITAL_COMMUNITY): Payer: Self-pay | Admitting: Psychiatry

## 2017-03-06 VITALS — BP 120/80 | HR 62 | Ht 70.0 in | Wt 227.0 lb

## 2017-03-06 DIAGNOSIS — Z79899 Other long term (current) drug therapy: Secondary | ICD-10-CM

## 2017-03-06 DIAGNOSIS — F3160 Bipolar disorder, current episode mixed, unspecified: Secondary | ICD-10-CM | POA: Diagnosis not present

## 2017-03-06 MED ORDER — ALPRAZOLAM 1 MG PO TABS: 1.0000 mg | ORAL_TABLET | Freq: Two times a day (BID) | ORAL | 2 refills | Status: DC

## 2017-03-06 MED ORDER — DULOXETINE HCL 60 MG PO CPEP
60.0000 mg | ORAL_CAPSULE | Freq: Every day | ORAL | 2 refills | Status: DC
Start: 2017-03-06 — End: 2017-05-05

## 2017-03-06 NOTE — Progress Notes (Signed)
Patient ID: Jane [name], female   DOB: [birth year], 51 y.o.   MRN: [id number] Patient ID: Jane [name], female   DOB: 1966-01-09, 51 y.o.   MRN: [id number] Patient ID: Jane [name], female   DOB: 1965/12/05, 51 y.o.   MRN: [id number] Patient ID: Jane [name], female   DOB: 1966-04-27, 51 y.o.   MRN: [id number] Patient ID: Jane [name], female   DOB: 11-Oct-1966, 51 y.o.   MRN: [id number] Patient ID: Jane [name], female   DOB: 25-Apr-1966, 51 y.o.   MRN: [id number] Patient ID: Jane [name], female   DOB: Jun 06, 1966, 51 y.o.   MRN: [id number] Patient ID: Jane [name], female   DOB: [birth year], 50 y.o.   MRN: [id number] Patient ID: Jane [name], female   DOB: 07-27-1966, 51 y.o.   MRN: [id number] Patient ID: Jane [name], female   DOB: 1965-12-18, 51 y.o.   MRN: [id number] Patient ID: Jane [name], female   DOB: 05-09-1966, 51 y.o.   MRN: [id number] Patient ID: Jane [name], female   DOB: 06-23-1966, 51 y.o.   MRN: [id number] Patient ID: Jane [name], female   DOB: 1966-05-21, 51 y.o.   MRN: [id number] Patient ID: Jane [name], female   DOB: 1966/05/15, 51 y.o.   MRN: [id number] Patient ID: Jane [name], female   DOB: December 13, 1965, 51 y.o.   MRN: [id number] Patient ID: Jane [name], female   DOB: 1966-04-03, 51 y.o.   MRN: [id number] Patient ID: Jane [name], female   DOB: February 09, 1966, 51 y.o.   MRN: [id number] Patient ID: Jane [name], female   DOB: May 04, 1966, 51 y.o.   MRN: [id number] Patient ID: Jane [name], female   DOB: 1966-05-11, 51 y.o.   MRN: [id number] [facility] [id number] Progress Note Jane [name] MRN: [id number] DOB: [birth year] Age: 51 y.o.  Date: 03/06/2017 Start Time: 1:45 PM End Time: 2:10 PM  Chief Complaint: Chief Complaint  Patient presents with  . Depression  . Anxiety  . Manic Behavior   Subjective: This patient is a 51 year-old divorced white female who lives alone in [place]. She is on disability due to bipolar disorder.  The patient  states that she became very depressed when her mother died in 02/13/00 of a brain aneurysm. She started getting. Sad but eventually became manic and develop mood swings and agitated behavior. She's been on numerous medications over the years. In February 13, 1988 she also had postpartum depression. For many years she was on lithium but it caused a bad taste in her mouth and her doctor was worried it might be starting to affect her renal function so was discontinued. She also thinks that it stopped working after a number of years.  The patient returns after 2 months. She states that she thinks she was in a "manic phase" because she was staying extremely active even though she was tired. She also found out in the emergency room yesterday that her thyroid tests were too high her heart was pounding and she felt like she was having a heart attack. Her Synthroid was reduced and she was put on propranolol. I explained that sometimes hyperthyroid state can mimic mania. She denies being depressed right now and the Adderall continues to help her focus. The Xanax continues to help with her anxiety .    Current Outpatient Prescriptions  Medication Sig Dispense Refill  . ALPRAZolam (XANAX) 1 MG tablet Take 1 tablet (1 mg total) by mouth 2 (two) times daily. 60 tablet 2  . amLODipine (NORVASC) 5 MG tablet Take 5 mg by mouth daily.    Marland Kitchen  amphetamine-dextroamphetamine (ADDERALL) 30 MG tablet Take 1 tablet by mouth 2 (two) times daily. 60 tablet 0  . aspirin-acetaminophen-caffeine (EXCEDRIN MIGRAINE) 250-250-65 MG tablet Take by mouth every 6 (six) hours as needed for headache.    . ATORVASTATIN CALCIUM PO Take by mouth daily.    . Chlorpheniramine Maleate (ALLERGY RELIEF PO) Take by mouth daily as needed.     . Cholecalciferol (VITAMIN D PO) Take 2,000 mg by mouth daily.    . cyclobenzaprine (FLEXERIL) 10 MG tablet Take 10 mg by mouth daily as needed for muscle spasms.    . cycloSPORINE (RESTASIS) 0.05 % ophthalmic emulsion Place 1  drop into both eyes 2 (two) times daily.    . DULoxetine (CYMBALTA) 60 MG capsule Take 1 capsule (60 mg total) by mouth daily. 30 capsule 2  . furosemide (LASIX) 20 MG tablet Take 20 mg by mouth daily as needed.    . hydrochlorothiazide (HYDRODIURIL) 25 MG tablet Take by mouth.    Marland Kitchen HYDROcodone-acetaminophen (NORCO) 10-325 MG per tablet Take 1 tablet by mouth 4 (four) times daily as needed.     Marland Kitchen levothyroxine (SYNTHROID, LEVOTHROID) 175 MCG tablet Take 1 tablet (175 mcg total) by mouth daily before breakfast. (Patient taking differently: Take 137 mcg by mouth daily before breakfast. ) 30 tablet 2  . MAGNESIUM CITRATE PO Take 250 mg by mouth daily.    . Melatonin 5 MG TABS Take 10 mg by mouth at bedtime.    . Multiple Vitamins-Minerals (MULTIVITAMIN WITH MINERALS) tablet Take 1 tablet by mouth daily.    . pantoprazole (PROTONIX) 40 MG tablet Take 40 mg by mouth daily.    Marland Kitchen POTASSIUM PO Take by mouth daily.    Marland Kitchen rOPINIRole (REQUIP) 2 MG tablet Take 2 mg by mouth 2 (two) times daily.    . vitamin C (ASCORBIC ACID) 500 MG tablet Take 1,000 mg by mouth daily.      No current facility-administered medications for this visit.    Allergies: Allergies  Allergen Reactions  . Abilify [Aripiprazole] Other (See Comments)    Very violent  . Gabapentin Other (See Comments)    Nightmares and talking to her nightmares  . Thorazine [Chlorpromazine] Other (See Comments)    Shakes, anxiety off the roof and panic attack  . Seroquel [Quetiapine Fumarate]    Medical History: Past Medical History:  Diagnosis Date  . Anxiety   . Bipolar disorder ([place])   . Depression   . DVT (deep venous thrombosis) ([place])   . Headache(784.0)   . Hypertension   . Hypothyroidism   . Obsessive-compulsive disorder   . Thyroid disease    Surgical History: Past Surgical History:  Procedure Laterality Date  . BACK SURGERY    . CHOLECYSTECTOMY    . SPINE SURGERY     Family History: family history includes ADD / ADHD  in her daughter; Anxiety disorder in her daughter; Bipolar disorder in her daughter, maternal aunt, and mother; Diabetes in her father; Drug abuse in her daughter and daughter; Heart disease in her brother, father, and mother; Hyperlipidemia in her brother, father, and mother; Hypertension in her brother, father, mother, and sister; Suicidality in her maternal aunt. Reviewed and nothing new this visit.  Diagnosis:   Axis I: Bipolar, depressed Axis II: Deferred Axis III: Hypothyroidism, history of DVT  Axis IV: other psychosocial or environmental problems Axis V: 60  ADL's:  Intact  Sleep: Poor  Appetite:  Good  Suicidal Ideation:  Pt denies any thoughts, plans,  intent of suicide Homicidal Ideation:  Pt denies any thoughts, plans, intent of homicide  AEB (as evidenced by):per pt report  Mental Status Examination/Evaluation: Objective:  Appearance: Casual  Eye Contact::  Good  Speech:  Clear and Coherent  Volume:  Normal  Mood: Good States that lately it has been up and down   Affect:  Bright   Thought Process:  Coherent  Orientation:  Full  Thought Content:  WDL  Suicidal Thoughts:  No  Homicidal Thoughts:  No  Memory:  Immediate;   Poor Recent;   Fair Remote;   Poor  Judgement:  Good  Insight:  Fair  Psychomotor Activity:  Normal  Concentration:  Poor  Recall:  Poor  Akathisia:  No  Handed:  Left  AIMS (if indicated):     Assets:  Communication Skills Desire for Improvement  Sleep:   too much   Vitals: BP 120/80   Pulse 62   Ht 5\' 10"  (1.778 m)   Wt 227 lb (103 kg)   BMI 32.57 kg/m   Lab Results:  Results for orders placed or performed in visit on 12/09/16 (from the past 8736 hour(s))  Antinuclear Antib (ANA)   Collection Time: 12/09/16 12:02 PM  Result Value Ref Range   Anit Nuclear Antibody(ANA) NEG NEGATIVE     Physical Findings: AIMS:  , ,  ,  ,    CIWA:    COWS:    Plan/Discussion: I took her vitals.  I reviewed CC, tobacco/med/surg Hx,  meds effects/ side effects, problem list, therapies and responses as well as current situation/symptoms discussed options. She'llcontinue Xanax 1 mg twice a day as needed for anxiety   and  Cymbalta 60 mg daily. She will  Continue Adderall for ADHD . She'll return in 2 months but call me if her Mania worsens We will check ANA MEDICATIONS this encounter: Meds ordered this encounter  Medications  . hydrochlorothiazide (HYDRODIURIL) 25 MG tablet    Sig: Take by mouth.  . ATORVASTATIN CALCIUM PO    Sig: Take by mouth daily.  . DULoxetine (CYMBALTA) 60 MG capsule    Sig: Take 1 capsule (60 mg total) by mouth daily.    Dispense:  30 capsule    Refill:  2  . ALPRAZolam (XANAX) 1 MG tablet    Sig: Take 1 tablet (1 mg total) by mouth 2 (two) times daily.    Dispense:  60 tablet    Refill:  2    Medical Decision Making Problem Points:  Established problem, worsening (2), Review of last therapy session (1) and Review of psycho-social stressors (1) Data Points:  Review or order clinical lab tests (1) Review of medication regiment & side effects (2) Review of new medications or change in dosage (2)  I certify that outpatient services furnished can reasonably be expected to improve the patient's condition.   [name], MD

## 2017-03-07 ENCOUNTER — Ambulatory Visit (HOSPITAL_COMMUNITY): Payer: Self-pay | Admitting: Psychiatry

## 2017-04-10 ENCOUNTER — Telehealth (HOSPITAL_COMMUNITY): Payer: Self-pay | Admitting: *Deleted

## 2017-04-10 NOTE — Telephone Encounter (Signed)
left voice message, provider out of office. 

## 2017-04-14 ENCOUNTER — Ambulatory Visit (HOSPITAL_COMMUNITY): Payer: Self-pay | Admitting: Licensed Clinical Social Worker

## 2017-04-22 ENCOUNTER — Ambulatory Visit (INDEPENDENT_AMBULATORY_CARE_PROVIDER_SITE_OTHER): Payer: Medicaid Other | Admitting: Licensed Clinical Social Worker

## 2017-04-22 ENCOUNTER — Encounter (HOSPITAL_COMMUNITY): Payer: Self-pay | Admitting: Licensed Clinical Social Worker

## 2017-04-22 DIAGNOSIS — F3130 Bipolar disorder, current episode depressed, mild or moderate severity, unspecified: Secondary | ICD-10-CM | POA: Diagnosis not present
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
## 2017-05-03 ENCOUNTER — Other Ambulatory Visit (HOSPITAL_COMMUNITY): Payer: Self-pay | Admitting: Psychiatry

## 2017-05-05 ENCOUNTER — Ambulatory Visit (INDEPENDENT_AMBULATORY_CARE_PROVIDER_SITE_OTHER): Payer: Medicaid Other | Admitting: Psychiatry

## 2017-05-05 ENCOUNTER — Encounter (HOSPITAL_COMMUNITY): Payer: Self-pay | Admitting: Psychiatry

## 2017-05-05 VITALS — BP 144/94 | HR 86 | Ht 70.0 in | Wt 239.0 lb

## 2017-05-05 DIAGNOSIS — Z79899 Other long term (current) drug therapy: Secondary | ICD-10-CM | POA: Diagnosis not present

## 2017-05-05 DIAGNOSIS — F3130 Bipolar disorder, current episode depressed, mild or moderate severity, unspecified: Secondary | ICD-10-CM | POA: Diagnosis not present

## 2017-05-05 DIAGNOSIS — Z818 Family history of other mental and behavioral disorders: Secondary | ICD-10-CM | POA: Diagnosis not present

## 2017-05-05 DIAGNOSIS — F909 Attention-deficit hyperactivity disorder, unspecified type: Secondary | ICD-10-CM

## 2017-05-05 DIAGNOSIS — Z888 Allergy status to other drugs, medicaments and biological substances status: Secondary | ICD-10-CM

## 2017-05-05 MED ORDER — ALPRAZOLAM 1 MG PO TABS: 1.0000 mg | ORAL_TABLET | Freq: Two times a day (BID) | ORAL | 2 refills | Status: DC

## 2017-05-05 MED ORDER — DULOXETINE HCL 60 MG PO CPEP: 60.0000 mg | ORAL_CAPSULE | Freq: Two times a day (BID) | ORAL | 2 refills | Status: DC

## 2017-05-05 NOTE — Progress Notes (Signed)
Patient ID: Arleny Kruger, female   DOB: 07/01/1966, 51 y.o.   MRN: [id number] Patient ID: Gillie Fleites, female   DOB: October 24, 1966, 51 y.o.   MRN: [id number] Patient ID: Tymara Saur, female   DOB: April 13, 1966, 51 y.o.   MRN: [id number] Patient ID: Latorria Zeoli, female   DOB: 03/09/1966, 51 y.o.   MRN: [id number] Patient ID: Norena Bratton, female   DOB: August 26, 1966, 51 y.o.   MRN: [id number] Patient ID: Geraldine Sandberg, female   DOB: September 18, 1966, 51 y.o.   MRN: [id number] Patient ID: Celita Aron, female   DOB: 1966-05-31, 51 y.o.   MRN: [id number] Patient ID: Corona Popovich, female   DOB: 23-Jun-1966, 51 y.o.   MRN: [id number] Patient ID: Winry Egnew, female   DOB: 1965-12-06, 51 y.o.   MRN: [id number] Patient ID: Miri Jose, female   DOB: Jan 21, 1966, 50 y.o.   MRN: [id number] Patient ID: Skyylar Kopf, female   DOB: 06/01/1966, 51 y.o.   MRN: [id number] Patient ID: Floriene Jeschke, female   DOB: November 03, 1966, 51 y.o.   MRN: [id number] Patient ID: Antasia Haider, female   DOB: [birth year], 51 y.o.   MRN: [id number] Patient ID: Leanna Hamid, female   DOB: 1966/09/15, 51 y.o.   MRN: [id number] Patient ID: Chasty Randal, female   DOB: [birth year], 51 y.o.   MRN: [id number] Patient ID: Fartun Paradiso, female   DOB: 05/28/1966, 51 y.o.   MRN: [id number] Patient ID: Adore Kithcart, female   DOB: 06/08/1966, 51 y.o.   MRN: [id number] Patient ID: Anesha Hackert, female   DOB: 04/16/1966, 51 y.o.   MRN: [id number] Patient ID: Geraldean Walen, female   DOB: 09/19/1966, 51 y.o.   MRN: [id number] [facility] [id number] Progress Note Aspyn Warnke MRN: [id number] DOB: 08/18/1966 Age: 51 y.o.  Date: 05/05/2017 Start Time: 1:45 PM End Time: 2:10 PM  Chief Complaint: Chief Complaint  Patient presents with  . Follow-up  . Depression  . Anxiety   Subjective: This patient is a 51 year-old divorced white female who lives alone in [place]. She is on disability due to bipolar disorder.  The patient states  that she became very depressed when her mother died in 03-Feb-2000 of a brain aneurysm. She started getting. Sad but eventually became manic and develop mood swings and agitated behavior. She's been on numerous medications over the years. In 02/03/1988 she also had postpartum depression. For many years she was on lithium but it caused a bad taste in her mouth and her doctor was worried it might be starting to affect her renal function so was discontinued. She also thinks that it stopped working after a number of years.  The patient returns after 2 months. She states that her back is giving her a lot of trouble and she has a ruptured disc. She is scheduled for back surgery at the end of the month. She is frustrated because she can't do anything right now due to pain. I suggested we increase the Cymbalta and she agrees. She has started therapy with a new therapist here and thinks this is going to be helpful. .    Current Outpatient Prescriptions  Medication Sig Dispense Refill  . ALPRAZolam (XANAX) 1 MG tablet Take 1 tablet (1 mg total) by mouth 2 (two) times daily. 60 tablet 2  . amLODipine (NORVASC) 5 MG tablet Take 5 mg by mouth daily.    Marland Kitchen amphetamine-dextroamphetamine (ADDERALL) 30 MG tablet Take 1 tablet by mouth 2 (two) times daily. 60 tablet 0  . aspirin-acetaminophen-caffeine ([place]) 017-494-49 MG  tablet Take by mouth every 6 (six) hours as needed for headache.    . ATORVASTATIN CALCIUM PO Take by mouth daily.    . Chlorpheniramine Maleate (ALLERGY RELIEF PO) Take by mouth daily as needed.     . Cholecalciferol (VITAMIN D PO) Take 2,000 mg by mouth daily.    . cyclobenzaprine (FLEXERIL) 10 MG tablet Take 10 mg by mouth daily as needed for muscle spasms.    . cycloSPORINE (RESTASIS) 0.05 % ophthalmic emulsion Place 1 drop into both eyes 2 (two) times daily.    . furosemide (LASIX) 20 MG tablet Take 20 mg by mouth daily as needed.    . hydrochlorothiazide (HYDRODIURIL) 25 MG tablet Take by  mouth.    Marland Kitchen HYDROcodone-acetaminophen (NORCO) 10-325 MG per tablet Take 1 tablet by mouth 4 (four) times daily as needed.     Marland Kitchen levothyroxine (SYNTHROID, LEVOTHROID) 175 MCG tablet Take 1 tablet (175 mcg total) by mouth daily before breakfast. (Patient taking differently: Take 137 mcg by mouth daily before breakfast. ) 30 tablet 2  . MAGNESIUM CITRATE PO Take 250 mg by mouth daily.    . Melatonin 5 MG TABS Take 10 mg by mouth at bedtime.    . Multiple Vitamins-Minerals (MULTIVITAMIN WITH MINERALS) tablet Take 1 tablet by mouth daily.    . pantoprazole (PROTONIX) 40 MG tablet Take 40 mg by mouth daily.    Marland Kitchen POTASSIUM PO Take by mouth daily.    Marland Kitchen rOPINIRole (REQUIP) 2 MG tablet Take 2 mg by mouth 2 (two) times daily.    . vitamin C (ASCORBIC ACID) 500 MG tablet Take 1,000 mg by mouth daily.     . DULoxetine (CYMBALTA) 60 MG capsule Take 1 capsule (60 mg total) by mouth 2 (two) times daily. 60 capsule 2   No current facility-administered medications for this visit.    Allergies: Allergies  Allergen Reactions  . Abilify [Aripiprazole] Other (See Comments)    Very violent  . Gabapentin Other (See Comments)    Nightmares and talking to her nightmares  . Thorazine [Chlorpromazine] Other (See Comments)    Shakes, anxiety off the roof and panic attack  . Seroquel [Quetiapine Fumarate]    Medical History: Past Medical History:  Diagnosis Date  . Anxiety   . Bipolar disorder ([place])   . Depression   . DVT (deep venous thrombosis) ([place])   . Headache(784.0)   . Hypertension   . Hypothyroidism   . Obsessive-compulsive disorder   . Thyroid disease    Surgical History: Past Surgical History:  Procedure Laterality Date  . BACK SURGERY    . CHOLECYSTECTOMY    . SPINE SURGERY     Family History: family history includes ADD / ADHD in her daughter; Anxiety disorder in her daughter; Bipolar disorder in her daughter, maternal aunt, and mother; Diabetes in her father; Drug abuse in her daughter  and daughter; Heart disease in her brother, father, and mother; Hyperlipidemia in her brother, father, and mother; Hypertension in her brother, father, mother, and sister; Suicidality in her maternal aunt. Reviewed and nothing new this visit.  Diagnosis:   Axis I: Bipolar, depressed Axis II: Deferred Axis III: Hypothyroidism, history of DVT  Axis IV: other psychosocial or environmental problems Axis V: 77  ADL's:  Intact  Sleep: Poor  Appetite:  Good  Suicidal Ideation:  Pt denies any thoughts, plans, intent of suicide Homicidal Ideation:  Pt denies any thoughts, plans, intent of homicide  AEB (as evidenced by):per pt report  Mental Status Examination/Evaluation: Objective:  Appearance: Casual  Eye Contact::  Good  Speech:  Clear and Coherent  Volume:  Normal  Mood: Good States that lately it has been up and down   Affect:  Fairly bright but frustrated due to pain   Thought Process:  Coherent  Orientation:  Full  Thought Content:  WDL  Suicidal Thoughts:  No  Homicidal Thoughts:  No  Memory:  Immediate;   Poor Recent;   Fair Remote;   Poor  Judgement:  Good  Insight:  Fair  Psychomotor Activity:  Normal  Concentration:  Poor  Recall:  Poor  Akathisia:  No  Handed:  Left  AIMS (if indicated):     Assets:  Communication Skills Desire for Improvement  Sleep:   too much   Vitals: BP (!) 144/94 (BP Location: Right Arm, Patient Position: Sitting, Cuff Size: Normal)   Pulse 86   Ht 5\' 10"  (1.778 m)   Wt 239 lb (108.4 kg)   BMI 34.29 kg/m   Lab Results:  Results for orders placed or performed in visit on 12/09/16 (from the past 8736 hour(s))  Antinuclear Antib (ANA)   Collection Time: 12/09/16 12:02 PM  Result Value Ref Range   Anit Nuclear Antibody(ANA) NEG NEGATIVE     Physical Findings: AIMS:  , ,  ,  ,    CIWA:    COWS:    Plan/Discussion: I took her vitals.  I reviewed CC, tobacco/med/surg Hx, meds effects/ side effects, problem list, therapies  and responses as well as current situation/symptoms discussed options. She'llcontinue Xanax 1 mg twice a day as needed for anxiety   and  Cymbalta Will be increased to 60 mg twice a day. She will  Continue Adderall for ADHD . She'll return in 2 months but call me if her Mania worsens We will check ANA MEDICATIONS this encounter: Meds ordered this encounter  Medications  . DULoxetine (CYMBALTA) 60 MG capsule    Sig: Take 1 capsule (60 mg total) by mouth 2 (two) times daily.    Dispense:  60 capsule    Refill:  2  . ALPRAZolam (XANAX) 1 MG tablet    Sig: Take 1 tablet (1 mg total) by mouth 2 (two) times daily.    Dispense:  60 tablet    Refill:  2    Medical Decision Making Problem Points:  Established problem, worsening (2), Review of last therapy session (1) and Review of psycho-social stressors (1) Data Points:  Review or order clinical lab tests (1) Review of medication regiment & side effects (2) Review of new medications or change in dosage (2)  I certify that outpatient services furnished can reasonably be expected to improve the patient's condition.   [name], MDPatient ID: [name], female   DOB: 12-21-1965, 51 y.o.   MRN: [id number]

## 2017-05-06 ENCOUNTER — Encounter (HOSPITAL_COMMUNITY): Payer: Self-pay | Admitting: Licensed Clinical Social Worker

## 2017-05-06 ENCOUNTER — Ambulatory Visit (INDEPENDENT_AMBULATORY_CARE_PROVIDER_SITE_OTHER): Payer: Medicaid Other | Admitting: Licensed Clinical Social Worker

## 2017-05-06 DIAGNOSIS — F3130 Bipolar disorder, current episode depressed, mild or moderate severity, unspecified: Secondary | ICD-10-CM | POA: Diagnosis not present
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
## 2017-05-21 ENCOUNTER — Ambulatory Visit (INDEPENDENT_AMBULATORY_CARE_PROVIDER_SITE_OTHER): Payer: Medicaid Other | Admitting: Licensed Clinical Social Worker

## 2017-05-21 ENCOUNTER — Encounter (HOSPITAL_COMMUNITY): Payer: Self-pay | Admitting: Licensed Clinical Social Worker

## 2017-05-21 DIAGNOSIS — F3130 Bipolar disorder, current episode depressed, mild or moderate severity, unspecified: Secondary | ICD-10-CM | POA: Diagnosis not present

## 2017-05-21 NOTE — Progress Notes (Signed)
   THERAPIST PROGRESS NOTE  Session Time: 4:00 pm-4:45 pm  Participation Level: Active  Behavioral Response: NeatAlertEuthymic  Type of Therapy: Individual Therapy  Treatment Goals addressed: Coping  Interventions: CBT and Solution Focused  Summary: Jane [name] is a 51 y.o. female who  presents oriented x5 (person, place, situation, time and object), alert, and depressed, and cooperative to address depression and anxiety. Patient has a history of medical including chronic back pain and mental health treatment including outpatient therapy and medication managment. She admits to symptoms of mania and notes that she experiences manic episodes in the winter time. Patient denies suicidal and homicidal ideations. She denies psychosis including auditory and visual hallucinations. Patient denies substance use. Patient is at no risk for lethality at this time.  Patient had an average score of 7.75 out of 10 on the Outcome Rating Scale which is an increase of 3.75 since the last session. Patient reported that her medication was increased and she is feeling better. Patient reported that she completed her homework and asked for help to get some of her list of tasks done. Patient also reported that she broke up with her boyfriend. She noted that they had been together for 17 years and the relationship was no going anywhere. She broke up with him due to comments he made about her and toward her daughter. Patient reported that things were going well with her daughters and she has been spending time with them. Patient noted that she has some anxiety over her upcoming surgery but has asked her daughter to stay with her until she wakes up from the surgery. Patient committed to make a list of things she wants to accomplish after her back surgery (go back to school, move, etc). Patient rated the session 10 out of 10 on the Session Rating Scale.   Patient engaged in session. She responded well to interventions.  She continues to meet the criteria for Bipolar I disorder, most recent episode depressed. Patient will continue in outpatient therapy due to being the least restrictive service to meet her needs. Patient made moderate progress on her goals at this time.   Suicidal/Homicidal: Nowithout intent/plan  Therapist Response:  Therapist reviewed patient's recent thoughts and behaviors. Therapist utilized CBT to address mood. Therapist followed up on patient's homework. Therapist processed patient breaking up with her boyfriend and how that will impact her. Therapist discussed with patient her relationship with her daughters. Therapist committed patient make a list of goals she wants to accomplish after her back surgery. Therapist administered the Outcome Rating Scale and the Session Rating Scale.   Plan: Return again in 2 weeks. Therapist will review patient goals on or before 09.19.2018  Diagnosis: Axis I: Bipolar, Depressed    Axis II: No diagnosis    Glori Bickers, LCSW 05/21/2017

## 2017-06-10 ENCOUNTER — Telehealth (HOSPITAL_COMMUNITY): Payer: Self-pay | Admitting: Psychiatry

## 2017-06-26 ENCOUNTER — Ambulatory Visit (INDEPENDENT_AMBULATORY_CARE_PROVIDER_SITE_OTHER): Payer: Medicaid Other | Admitting: Licensed Clinical Social Worker

## 2017-06-26 DIAGNOSIS — F3130 Bipolar disorder, current episode depressed, mild or moderate severity, unspecified: Secondary | ICD-10-CM | POA: Diagnosis not present
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
## 2017-07-08 ENCOUNTER — Encounter (HOSPITAL_COMMUNITY): Payer: Self-pay | Admitting: Psychiatry

## 2017-07-08 ENCOUNTER — Ambulatory Visit (INDEPENDENT_AMBULATORY_CARE_PROVIDER_SITE_OTHER): Payer: Medicaid Other | Admitting: Psychiatry

## 2017-07-08 VITALS — BP 119/65 | HR 84 | Ht 70.0 in | Wt 237.0 lb

## 2017-07-08 DIAGNOSIS — F3130 Bipolar disorder, current episode depressed, mild or moderate severity, unspecified: Secondary | ICD-10-CM | POA: Diagnosis not present

## 2017-07-08 DIAGNOSIS — Z736 Limitation of activities due to disability: Secondary | ICD-10-CM | POA: Diagnosis not present

## 2017-07-08 DIAGNOSIS — Z813 Family history of other psychoactive substance abuse and dependence: Secondary | ICD-10-CM | POA: Diagnosis not present

## 2017-07-08 DIAGNOSIS — Z818 Family history of other mental and behavioral disorders: Secondary | ICD-10-CM | POA: Diagnosis not present

## 2017-07-08 MED ORDER — AMPHETAMINE-DEXTROAMPHETAMINE 30 MG PO TABS: 30.0000 mg | ORAL_TABLET | Freq: Two times a day (BID) | ORAL | 0 refills | Status: DC

## 2017-07-08 MED ORDER — DULOXETINE HCL 60 MG PO CPEP: 60.0000 mg | ORAL_CAPSULE | Freq: Two times a day (BID) | ORAL | 2 refills | Status: DC

## 2017-07-08 MED ORDER — ALPRAZOLAM 1 MG PO TABS
1.0000 mg | ORAL_TABLET | Freq: Two times a day (BID) | ORAL | 2 refills | Status: DC
Start: 2017-07-08 — End: 2017-09-11

## 2017-07-08 MED ORDER — DULOXETINE HCL 60 MG PO CPEP
60.0000 mg | ORAL_CAPSULE | Freq: Two times a day (BID) | ORAL | 2 refills | Status: DC
Start: 2017-07-08 — End: 2017-09-11

## 2017-07-08 NOTE — Progress Notes (Signed)
Patient ID: Cale Decarolis, female   DOB: [birth year], 51 y.o.   MRN: [id number] Patient ID: Aziza Stuckert, female   DOB: April 14, 1966, 51 y.o.   MRN: [id number] Patient ID: Marnell Mcdaniel, female   DOB: 12/07/1965, 51 y.o.   MRN: [id number] Patient ID: Calyssa Zobrist, female   DOB: Oct 13, 1966, 51 y.o.   MRN: [id number] Patient ID: Radiah Lubinski, female   DOB: Jul 23, 1966, 51 y.o.   MRN: [id number] Patient ID: Sharissa Brierley, female   DOB: 06/21/1966, 51 y.o.   MRN: [id number] Patient ID: Analucia Hush, female   DOB: 03-25-1966, 51 y.o.   MRN: [id number] Patient ID: Shaaron Golliday, female   DOB: [birth year], 51 y.o.   MRN: [id number] Patient ID: Bula Cavalieri, female   DOB: 1966-01-29, 51 y.o.   MRN: [id number] Patient ID: Amiyrah Lamere, female   DOB: 1966-04-20, 51 y.o.   MRN: [id number] Patient ID: Kasiah Manka, female   DOB: 1966/02/07, 51 y.o.   MRN: [id number] Patient ID: Jozi Malachi, female   DOB: 03/16/1966, 51 y.o.   MRN: [id number] Patient ID: Chidinma Clites, female   DOB: [birth year], 51 y.o.   MRN: [id number] Patient ID: Jaliana Medellin, female   DOB: [birth year], 51 y.o.   MRN: [id number] Patient ID: Terrell Shimko, female   DOB: 1966/09/13, 51 y.o.   MRN: [id number] Patient ID: Sakshi Sermons, female   DOB: 1966/11/01, 51 y.o.   MRN: [id number] Patient ID: Zanetta Dehaan, female   DOB: 26-Jun-1966, 51 y.o.   MRN: [id number] Patient ID: Malcolm Hetz, female   DOB: [birth year], 51 y.o.   MRN: [id number] Patient ID: Ora Bollig, female   DOB: Mar 17, 1966, 51 y.o.   MRN: [id number] [facility] [id number] Progress Note Wyndi Northrup MRN: [id number] DOB: 1965/12/06 Age: 51 y.o.  Date: 07/08/2017 Start Time: 1:45 PM End Time: 2:10 PM  Chief Complaint: Chief Complaint  Patient presents with  . Depression  . Anxiety  . Follow-up   Subjective: This patient is a 51 year-old divorced white female who lives alone in [place]. She is on disability due to bipolar disorder.  The patient states  that she became very depressed when her mother died in 02/13/2000 of a brain aneurysm. She started getting. Sad but eventually became manic and develop mood swings and agitated behavior. She's been on numerous medications over the years. In 02-13-1988 she also had postpartum depression. For many years she was on lithium but it caused a bad taste in her mouth and her doctor was worried it might be starting to affect her renal function so was discontinued. She also thinks that it stopped working after a number of years.  The patient returns after 2 months. She states that she had back surgery at [facility] last month. Her back is feeling a lot better but she still has some spasming. She is taking gabapentin and a muscle relaxer. Her mood is generally been good and the increase in Cymbalta has helped. The Adderall continues to help her focus and Xanax helps her anxiety. She is getting along fairly well with her family although she and 2 of her daughters continue to have conflicts. She is seeing one of our therapists here .    Current Outpatient Prescriptions  Medication Sig Dispense Refill  . ALPRAZolam (XANAX) 1 MG tablet Take 1 tablet (1 mg total) by mouth 2 (two) times daily. 60 tablet 2  . amLODipine (NORVASC) 5 MG tablet Take 5 mg by mouth daily.    Marland Kitchen amphetamine-dextroamphetamine (ADDERALL) 30 MG tablet Take 1  tablet by mouth 2 (two) times daily. 60 tablet 0  . aspirin-acetaminophen-caffeine (EXCEDRIN MIGRAINE) 250-250-65 MG tablet Take by mouth every 6 (six) hours as needed for headache.    . ATORVASTATIN CALCIUM PO Take 20 mg by mouth daily.     . Cholecalciferol (VITAMIN D PO) Take 2,000 mg by mouth daily.    . cyclobenzaprine (FLEXERIL) 10 MG tablet Take 10 mg by mouth daily as needed for muscle spasms.    . cycloSPORINE (RESTASIS) 0.05 % ophthalmic emulsion Place 1 drop into both eyes 2 (two) times daily.    . DULoxetine (CYMBALTA) 60 MG capsule Take 1 capsule (60 mg total) by mouth 2 (two) times  daily. 60 capsule 2  . furosemide (LASIX) 20 MG tablet Take 20 mg by mouth daily as needed.    . gabapentin (NEURONTIN) 300 MG capsule Take 300 mg by mouth. Taking 1 tab in AM and 2 tab in PM.    . levothyroxine (SYNTHROID, LEVOTHROID) 175 MCG tablet Take 1 tablet (175 mcg total) by mouth daily before breakfast. (Patient taking differently: Take 137 mcg by mouth daily before breakfast. ) 30 tablet 2  . linaclotide (LINZESS) 145 MCG CAPS capsule Take by mouth.    Marland Kitchen MAGNESIUM CITRATE PO Take 250 mg by mouth daily.    . Melatonin 5 MG TABS Take 10 mg by mouth at bedtime.    . Multiple Vitamins-Minerals (MULTIVITAMIN WITH MINERALS) tablet Take 1 tablet by mouth daily.    . NON FORMULARY Amberen for Menopause Relief QD    . pantoprazole (PROTONIX) 40 MG tablet Take 40 mg by mouth daily.    Marland Kitchen POTASSIUM PO Take 10 mcg by mouth 2 (two) times daily.     Marland Kitchen rOPINIRole (REQUIP) 2 MG tablet Take 2 mg by mouth 2 (two) times daily.    . vitamin C (ASCORBIC ACID) 500 MG tablet Take 1,000 mg by mouth daily.     Marland Kitchen amphetamine-dextroamphetamine (ADDERALL) 30 MG tablet Take 1 tablet by mouth 2 (two) times daily. 60 tablet 0  . Chlorpheniramine Maleate (ALLERGY RELIEF PO) Take by mouth daily as needed.     Marland Kitchen HYDROcodone-acetaminophen (NORCO) 10-325 MG per tablet Take 1 tablet by mouth 4 (four) times daily as needed.      No current facility-administered medications for this visit.    Allergies: Allergies  Allergen Reactions  . Abilify [Aripiprazole] Other (See Comments)    Very violent  . Gabapentin Other (See Comments)    Nightmares and talking to her nightmares  . Thorazine [Chlorpromazine] Other (See Comments)    Shakes, anxiety off the roof and panic attack  . Seroquel [Quetiapine Fumarate]    Medical History: Past Medical History:  Diagnosis Date  . Anxiety   . Bipolar disorder ([place])   . Depression   . DVT (deep venous thrombosis) ([place])   . Headache(784.0)   . Hypertension   . Hypothyroidism    . Obsessive-compulsive disorder   . Thyroid disease    Surgical History: Past Surgical History:  Procedure Laterality Date  . BACK SURGERY    . CHOLECYSTECTOMY    . SPINE SURGERY     Family History: family history includes ADD / ADHD in her daughter; Anxiety disorder in her daughter; Bipolar disorder in her daughter, maternal aunt, and mother; Diabetes in her father; Drug abuse in her daughter and daughter; Heart disease in her brother, father, and mother; Hyperlipidemia in her brother, father, and mother; Hypertension in her brother, father, mother, and  sister; Suicidality in her maternal aunt. Reviewed and nothing new this visit.  Diagnosis:   Axis I: Bipolar, depressed Axis II: Deferred Axis III: Hypothyroidism, history of DVT  Axis IV: other psychosocial or environmental problems Axis V: 40  ADL's:  Intact  Sleep: Poor  Appetite:  Good  Suicidal Ideation:  Pt denies any thoughts, plans, intent of suicide Homicidal Ideation:  Pt denies any thoughts, plans, intent of homicide  AEB (as evidenced by):per pt report  Mental Status Examination/Evaluation: Objective:  Appearance: Casual  Eye Contact::  Good  Speech:  Clear and Coherent  Volume:  Normal  Mood: Good   Affect:  Fairly bright   Thought Process:  Coherent  Orientation:  Full  Thought Content:  WDL  Suicidal Thoughts:  No  Homicidal Thoughts:  No  Memory:  Immediate;   Poor Recent;   Fair Remote;   Poor  Judgement:  Good  Insight:  Fair  Psychomotor Activity:  Normal  Concentration:  Poor  Recall:  Poor  Akathisia:  No  Handed:  Left  AIMS (if indicated):     Assets:  Communication Skills Desire for Improvement  Sleep:   too much   Vitals: BP 119/65   Pulse 84   Ht 5\' 10"  (1.778 m)   Wt 237 lb (107.5 kg)   BMI 34.01 kg/m   Lab Results:  Results for orders placed or performed in visit on 12/09/16 (from the past 8736 hour(s))  Antinuclear Antib (ANA)   Collection Time: 12/09/16 12:02 PM   Result Value Ref Range   Anit Nuclear Antibody(ANA) NEG NEGATIVE     Physical Findings: AIMS:  , ,  ,  ,    CIWA:    COWS:    Plan/Discussion: I took her vitals.  I reviewed CC, tobacco/med/surg Hx, meds effects/ side effects, problem list, therapies and responses as well as current situation/symptoms discussed options. She'llcontinue Xanax 1 mg twice a day as needed for anxiety   and  Cymbalta Will be Continued at 60 mg twice a day. She will  Continue Adderall for ADHD . She'll return in 2 months but call me if her Mania worsens We will check ANA MEDICATIONS this encounter: Meds ordered this encounter  Medications  . gabapentin (NEURONTIN) 300 MG capsule    Sig: Take 300 mg by mouth. Taking 1 tab in AM and 2 tab in PM.  . NON FORMULARY    Sig: Amberen for Menopause Relief QD  . linaclotide (LINZESS) 145 MCG CAPS capsule    Sig: Take by mouth.  . DISCONTD: DULoxetine (CYMBALTA) 60 MG capsule    Sig: Take 1 capsule (60 mg total) by mouth 2 (two) times daily.    Dispense:  60 capsule    Refill:  2  . amphetamine-dextroamphetamine (ADDERALL) 30 MG tablet    Sig: Take 1 tablet by mouth 2 (two) times daily.    Dispense:  60 tablet    Refill:  0  . amphetamine-dextroamphetamine (ADDERALL) 30 MG tablet    Sig: Take 1 tablet by mouth 2 (two) times daily.    Dispense:  60 tablet    Refill:  0    Fill after 08/07/17  . ALPRAZolam (XANAX) 1 MG tablet    Sig: Take 1 tablet (1 mg total) by mouth 2 (two) times daily.    Dispense:  60 tablet    Refill:  2  . DULoxetine (CYMBALTA) 60 MG capsule    Sig: Take 1 capsule (60 mg  total) by mouth 2 (two) times daily.    Dispense:  60 capsule    Refill:  2    Medical Decision Making Problem Points:  Established problem, worsening (2), Review of last therapy session (1) and Review of psycho-social stressors (1) Data Points:  Review or order clinical lab tests (1) Review of medication regiment & side effects (2) Review of new medications or  change in dosage (2)  I certify that outpatient services furnished can reasonably be expected to improve the patient's condition.   [name], MDPatient ID: [name], female   DOB: Sep 19, 1966, 51 y.o.   MRN: [id number]

## 2017-07-14 DIAGNOSIS — M5126 Other intervertebral disc displacement, lumbar region: Secondary | ICD-10-CM | POA: Insufficient documentation

## 2017-07-16 ENCOUNTER — Ambulatory Visit (HOSPITAL_COMMUNITY): Payer: Medicaid Other | Admitting: Licensed Clinical Social Worker

## 2017-07-17 ENCOUNTER — Ambulatory Visit (HOSPITAL_COMMUNITY): Payer: Medicaid Other | Admitting: Licensed Clinical Social Worker

## 2017-08-11 ENCOUNTER — Other Ambulatory Visit (HOSPITAL_COMMUNITY): Payer: Self-pay | Admitting: Pulmonary Disease

## 2017-08-11 ENCOUNTER — Ambulatory Visit (HOSPITAL_COMMUNITY)
Admission: RE | Admit: 2017-08-11 | Discharge: 2017-08-11 | Disposition: A | Discharge status: Home / Self Care | Payer: Medicaid Other | Source: Ambulatory Visit | Attending: Pulmonary Disease | Admitting: Pulmonary Disease

## 2017-08-11 DIAGNOSIS — R2242 Localized swelling, mass and lump, left lower limb: Secondary | ICD-10-CM | POA: Diagnosis not present

## 2017-08-11 DIAGNOSIS — M7989 Other specified soft tissue disorders: Secondary | ICD-10-CM

## 2017-08-12 ENCOUNTER — Ambulatory Visit (HOSPITAL_COMMUNITY): Payer: Self-pay | Admitting: Licensed Clinical Social Worker

## 2017-08-20 ENCOUNTER — Ambulatory Visit (INDEPENDENT_AMBULATORY_CARE_PROVIDER_SITE_OTHER): Payer: Medicaid Other | Admitting: Licensed Clinical Social Worker

## 2017-08-20 DIAGNOSIS — F3160 Bipolar disorder, current episode mixed, unspecified: Secondary | ICD-10-CM | POA: Diagnosis not present

## 2017-08-20 NOTE — Progress Notes (Signed)
   THERAPIST PROGRESS NOTE  Session Time: 3:00 pm-3:45 pm  Participation Level: Active  Behavioral Response: NeatAlertEuthymic  Type of Therapy: Individual Therapy  Treatment Goals addressed: Coping  Interventions: CBT and Solution Focused  Summary: Jane [name] is a 51 y.o. female who  presents oriented x5 (person, place, situation, time and object), alert, and depressed, and cooperative to address depression and anxiety. Patient has a history of medical including chronic back pain and mental health treatment including outpatient therapy and medication managment. She admits to symptoms of mania and notes that she experiences manic episodes in the winter time. Patient denies suicidal and homicidal ideations. She denies psychosis including auditory and visual hallucinations. Patient denies substance use. Patient is at no risk for lethality at this time.  Patient had an average score of 4.50 out of 10 on the Outcome Rating Scale which is an decrease since the last session. Patient reported that she is manic. She noted that she has high energy and has experienced reduced sleep. Patient noted that this episode is not as bad as previous ones. Patient noted that she will crash and possibly get depressed. After discussion, patient noted that after she crashes she normally has force herself to do things like take a shower or feed her animals. Patient plans to make herself get up and go after she crashes so she doesn't sleep all the time. Patient shared her frustrations with some of her family and her boyfriend but overall says things are good with her family. She also noted that she is doing well physically but they found two blood clots in her leg. She is getting treatment for the blood clots. Patient committed to keep her routine after she crashes by getting up and showering, cleaning her home and taking care of her pets even when she doesn't feel like it.  Patient rated the session 9.25 out of 10 on  the Session Rating Scale.   Patient engaged in session. She responded well to interventions. She continues to meet the criteria for Bipolar I disorder, most recent episode depressed. Patient will continue in outpatient therapy due to being the least restrictive service to meet her needs. Patient made moderate progress on her goals at this time.   Suicidal/Homicidal: Nowithout intent/plan  Therapist Response:  Therapist reviewed patient's recent thoughts and behaviors. Therapist utilized CBT to address mood. Therapist processed patient's feelings to identify triggers for mood. Therapist examined patient's manic episodes and when she crashes. Therapist discussed how patient copes after she crashes. Therapist committed patient to keep her routine after she crashes by getting up and showering, cleaning her home and taking care of her pets even when she doesn't feel like it. Therapist administered the Outcome Rating Scale and the Session Rating Scale.   Plan: Return again in 2 weeks. Therapist will review patient goals on or before 09.19.2018  Diagnosis: Axis I: Bipolar, Depressed    Axis II: No diagnosis    Glori Bickers, LCSW 08/20/2017

## 2017-09-01 ENCOUNTER — Other Ambulatory Visit: Payer: Self-pay

## 2017-09-01 DIAGNOSIS — M7989 Other specified soft tissue disorders: Secondary | ICD-10-CM

## 2017-09-03 LAB — LIPID PANEL
Cholesterol: 183 (ref 0–200)
HDL: 35 (ref 35–70)
LDL Cholesterol: 108
Triglycerides: 303 — AB (ref 40–160)

## 2017-09-04 ENCOUNTER — Ambulatory Visit (HOSPITAL_COMMUNITY): Payer: Self-pay | Admitting: Psychiatry

## 2017-09-10 ENCOUNTER — Ambulatory Visit (INDEPENDENT_AMBULATORY_CARE_PROVIDER_SITE_OTHER): Payer: Medicaid Other | Admitting: Licensed Clinical Social Worker

## 2017-09-10 ENCOUNTER — Encounter (HOSPITAL_COMMUNITY): Payer: Self-pay | Admitting: Licensed Clinical Social Worker

## 2017-09-10 DIAGNOSIS — F3130 Bipolar disorder, current episode depressed, mild or moderate severity, unspecified: Secondary | ICD-10-CM | POA: Diagnosis not present
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
## 2017-09-11 ENCOUNTER — Encounter (HOSPITAL_COMMUNITY): Payer: Self-pay | Admitting: Psychiatry

## 2017-09-11 ENCOUNTER — Ambulatory Visit (INDEPENDENT_AMBULATORY_CARE_PROVIDER_SITE_OTHER): Payer: Medicaid Other | Admitting: Psychiatry

## 2017-09-11 VITALS — BP 123/76 | HR 92 | Ht 70.0 in | Wt 248.0 lb

## 2017-09-11 DIAGNOSIS — F3130 Bipolar disorder, current episode depressed, mild or moderate severity, unspecified: Secondary | ICD-10-CM

## 2017-09-11 MED ORDER — ALPRAZOLAM 1 MG PO TABS: 1.0000 mg | ORAL_TABLET | Freq: Two times a day (BID) | ORAL | 2 refills | Status: DC

## 2017-09-11 MED ORDER — AMPHETAMINE-DEXTROAMPHETAMINE 30 MG PO TABS: 30.0000 mg | ORAL_TABLET | Freq: Two times a day (BID) | ORAL | 0 refills | Status: DC

## 2017-09-11 MED ORDER — DULOXETINE HCL 60 MG PO CPEP: 60.0000 mg | ORAL_CAPSULE | Freq: Two times a day (BID) | ORAL | 2 refills | Status: DC

## 2017-09-11 NOTE — Progress Notes (Signed)
[facility] MD/PA/NP OP Progress Note  09/11/2017 8:46 AM Jane [name]  MRN:  [id number]  Chief Complaint:  Chief Complaint    Depression; Manic Behavior; Anxiety; Follow-up     HPI: This patient is a 51 year-old divorced white female who lives alone in [place]. She is on disability due to bipolar disorder.  The patient states that she became very depressed when her mother died in 2000/02/22 of a brain aneurysm. She started getting. Sad but eventually became manic and develop mood swings and agitated behavior. She's been on numerous medications over the years. In Feb 22, 1988 she also had postpartum depression. For many years she was on lithium but it caused a bad taste in her mouth and her doctor was worried it might be starting to affect her renal function so was discontinued. She also thinks that it stopped working after a number of years.  The patient returns after 2 months.  She states that she has had a few hypomanic episodes.  These last for a day or 2 and she gets very energetic and hyperactive and does not sleep well.  She has been on numerous mood stabilizers in the past including lithium Depakote Tegretol Lamictal Risperdal and Seroquel.  She claims that none of them of helped and she really does not want to go back to any of these right now.  She states that these episodes are "not too bad" and she is not depressed on the Cymbalta.  The Xanax continues to help her anxiety and she is sleeping well.  Adderall continues to help with her focus.  She is seeing a counselor here and trying to work out difficulties with her daughter and her boyfriend   Visit Diagnosis:    ICD-10-CM   1. Bipolar I disorder, most recent episode depressed ([place]) F31.30     Past Psychiatric History: Long-term outpatient treatment  Past Medical History:  Past Medical History:  Diagnosis Date  . Anxiety   . Bipolar disorder ([place])   . Depression   . DVT (deep venous thrombosis) ([place])   . Headache(784.0)   . Hypertension   .  Hypothyroidism   . Obsessive-compulsive disorder   . Thyroid disease     Past Surgical History:  Procedure Laterality Date  . BACK SURGERY    . CHOLECYSTECTOMY    . SPINE SURGERY      Family Psychiatric History: Mother and maternal aunt with bipolar disorder  Family History:  Family History  Problem Relation Age of Onset  . Heart disease Mother   . Hyperlipidemia Mother   . Hypertension Mother   . Bipolar disorder Mother   . Diabetes Father   . Heart disease Father   . Hyperlipidemia Father   . Hypertension Father   . Hypertension Sister   . Hypertension Brother   . Hyperlipidemia Brother   . Heart disease Brother   . Bipolar disorder Maternal Aunt   . Suicidality Maternal Aunt   . Alcohol abuse Neg Hx   . Dementia Neg Hx   . OCD Neg Hx   . Paranoid behavior Neg Hx   . Schizophrenia Neg Hx   . Seizures Neg Hx   . Sexual abuse Neg Hx   . Physical abuse Neg Hx   . Drug abuse Daughter   . ADD / ADHD Daughter   . Drug abuse Daughter   . Anxiety disorder Daughter   . Bipolar disorder Daughter     Social History:  Social History   Socioeconomic History  .  Marital status: Divorced    Spouse name: None  . Number of children: None  . Years of education: None  . Highest education level: None  Social Needs  . Financial resource strain: None  . Food insecurity - worry: None  . Food insecurity - inability: None  . Transportation needs - medical: None  . Transportation needs - non-medical: None  Occupational History  . None  Tobacco Use  . Smoking status: Never Smoker  . Smokeless tobacco: Never Used  Substance and Sexual Activity  . Alcohol use: No  . Drug use: No  . Sexual activity: Yes    Partners: Male    Birth control/protection: Surgical  Other Topics Concern  . None  Social History Narrative  . None    Allergies:  Allergies  Allergen Reactions  . Abilify [Aripiprazole] Other (See Comments)    Very violent  . Gabapentin Other (See Comments)     Nightmares and talking to her nightmares  . Thorazine [Chlorpromazine] Other (See Comments)    Shakes, anxiety off the roof and panic attack  . Seroquel [Quetiapine Fumarate]     Metabolic Disorder Labs: No results found for: HGBA1C, MPG No results found for: PROLACTIN No results found for: CHOL, TRIG, HDL, CHOLHDL, VLDL, LDLCALC No results found for: TSH  Therapeutic Level Labs: No results found for: LITHIUM Lab Results  Component Value Date   VALPROATE 61.2 03/23/2014   No components found for:  CBMZ  Current Medications: Current Outpatient Medications  Medication Sig Dispense Refill  . ALPRAZolam (XANAX) 1 MG tablet Take 1 tablet (1 mg total) 2 (two) times daily by mouth. 60 tablet 2  . amLODipine (NORVASC) 5 MG tablet Take 5 mg by mouth daily.    Marland Kitchen amphetamine-dextroamphetamine (ADDERALL) 30 MG tablet Take 1 tablet 2 (two) times daily by mouth. 60 tablet 0  . amphetamine-dextroamphetamine (ADDERALL) 30 MG tablet Take 1 tablet 2 (two) times daily by mouth. Fill after 10/11/17 60 tablet 0  . aspirin-acetaminophen-caffeine (EXCEDRIN MIGRAINE) 250-250-65 MG tablet Take by mouth every 6 (six) hours as needed for headache.    . ATORVASTATIN CALCIUM PO Take 20 mg by mouth daily.     . Chlorpheniramine Maleate (ALLERGY RELIEF PO) Take by mouth daily as needed.     . Cholecalciferol (VITAMIN D PO) Take 2,000 mg by mouth daily.    . cyclobenzaprine (FLEXERIL) 10 MG tablet Take 10 mg by mouth daily as needed for muscle spasms.    . cycloSPORINE (RESTASIS) 0.05 % ophthalmic emulsion Place 1 drop into both eyes 2 (two) times daily.    . DULoxetine (CYMBALTA) 60 MG capsule Take 1 capsule (60 mg total) 2 (two) times daily by mouth. 60 capsule 2  . furosemide (LASIX) 20 MG tablet Take 20 mg by mouth daily as needed.    Marland Kitchen HYDROcodone-acetaminophen (NORCO) 10-325 MG per tablet Take 1 tablet by mouth 4 (four) times daily as needed.     Marland Kitchen levothyroxine (SYNTHROID, LEVOTHROID) 175 MCG tablet Take  1 tablet (175 mcg total) by mouth daily before breakfast. (Patient taking differently: Take 137 mcg by mouth daily before breakfast. ) 30 tablet 2  . linaclotide (LINZESS) 145 MCG CAPS capsule Take by mouth.    Marland Kitchen MAGNESIUM CITRATE PO Take 250 mg by mouth daily.    . Melatonin 5 MG TABS Take 10 mg by mouth at bedtime.    . Multiple Vitamins-Minerals (MULTIVITAMIN WITH MINERALS) tablet Take 1 tablet by mouth daily.    Marland Kitchen  NON FORMULARY Amberen for Menopause Relief QD    . pantoprazole (PROTONIX) 40 MG tablet Take 40 mg by mouth daily.    Marland Kitchen POTASSIUM PO Take 10 mcg 3 (three) times daily by mouth.     Marland Kitchen rOPINIRole (REQUIP) 2 MG tablet Take 2 mg by mouth 2 (two) times daily.    . vitamin C (ASCORBIC ACID) 500 MG tablet Take 1,000 mg by mouth daily.      No current facility-administered medications for this visit.      Musculoskeletal: Strength & Muscle Tone: within normal limits Gait & Station: normal Patient leans: N/A  Psychiatric Specialty Exam: Review of Systems  Genitourinary: Positive for frequency.  Musculoskeletal: Positive for joint pain.  Neurological: Positive for tingling.  Psychiatric/Behavioral: The patient is nervous/anxious.   All other systems reviewed and are negative.   Blood pressure 123/76, pulse 92, height 5\' 10"  (1.778 m), weight 248 lb (112.5 kg).Body mass index is 35.58 kg/m.  General Appearance: Casual and Fairly Groomed  Eye Contact:  Good  Speech:  Clear and Coherent  Volume:  Normal  Mood:  Anxious  Affect:  Congruent  Thought Process:  Goal Directed  Orientation:  Full (Time, Place, and Person)  Thought Content: Rumination   Suicidal Thoughts:  No  Homicidal Thoughts:  No  Memory:  Immediate;   Good Recent;   Good Remote;   Good  Judgement:  Fair  Insight:  Fair  Psychomotor Activity:  Normal  Concentration:  Concentration: Good and Attention Span: Good  Recall:  Good  Fund of Knowledge: Good  Language: Good  Akathisia:  No  Handed:  Right   AIMS (if indicated): not done  Assets:  Communication Skills Desire for Improvement Resilience Social Support Talents/Skills  ADL's:  Intact  Cognition: WNL  Sleep:  Fair   Screenings: PHQ2-9     Nutrition from 09/22/2014 in [facility]  PHQ-2 Total Score  4  PHQ-9 Total Score  15       Assessment and Plan: This patient is a 51 year old female with a history of bipolar disorder, probably type II.  She states her hypomanic episodes are not bothering her that much right now and she rather stay off mood stabilizers.  She states that her mood is fairly good with the Cymbalta 60 mg twice daily.  She will continue Xanax 1 mg twice daily for anxiety and Adderall 30 mg twice daily for focus.  She will return to see me in 2 months   [name], MD 09/11/2017, 8:46 AM

## 2017-10-01 ENCOUNTER — Encounter (HOSPITAL_COMMUNITY): Payer: Self-pay | Admitting: Licensed Clinical Social Worker

## 2017-10-01 ENCOUNTER — Ambulatory Visit (INDEPENDENT_AMBULATORY_CARE_PROVIDER_SITE_OTHER): Payer: Medicaid Other | Admitting: Licensed Clinical Social Worker

## 2017-10-01 DIAGNOSIS — F3130 Bipolar disorder, current episode depressed, mild or moderate severity, unspecified: Secondary | ICD-10-CM

## 2017-10-01 NOTE — Progress Notes (Signed)
   THERAPIST PROGRESS NOTE  Session Time: 9:00 am-9:45 pm  Participation Level: Active  Behavioral Response: NeatAlertEuthymic  Type of Therapy: Individual Therapy  Treatment Goals addressed: Coping  Interventions: CBT and Solution Focused  Summary: Jane [name] is a 51 y.o. female who  presents oriented x5 (person, place, situation, time and object), alert, and depressed, and cooperative to address depression and anxiety. Patient has a history of medical including chronic back pain and mental health treatment including outpatient therapy and medication managment. She admits to symptoms of mania and notes that she experiences manic episodes in the winter time. Patient denies suicidal and homicidal ideations. She denies psychosis including auditory and visual hallucinations. Patient denies substance use. Patient is at no risk for lethality at this time.  Patient had an average score of 6.25 out of 10 on the Outcome Rating Scale. Patient was stressed with her family. She explained that she had an argument with her daughter and her daughter told her that she can't see her grandson. Patient was very upset. She also reported that she broke up with her boyfriend but he continues to come around and they continue to talk. Patient was feeling overwhelmed. After discussion, patient understood she needs to set boundaries with family and boyfriend to get space but also be able to interact with them. Patient rated the session 7.25 out of 10 on the Session Rating Scale.   Patient engaged in session. She responded well to interventions. She continues to meet the criteria for Bipolar I disorder, most recent episode depressed. Patient will continue in outpatient therapy due to being the least restrictive service to meet her needs. Patient made moderate progress on her goals at this time.   Suicidal/Homicidal: Nowithout intent/plan  Therapist Response:  Therapist reviewed patient's recent thoughts and  behaviors. Therapist utilized CBT to address mood. Therapist processed patient's feelings to identify triggers for mood. Therapist discussed patient's relationships with family and boyfriend. Therapist committed patient to continue to work on setting boundaries with others. Therapist administered the Outcome Rating Scale and the Session Rating Scale.   Plan: Return again in 2 weeks. Therapist will review patient goals on or before 09.19.2018  Diagnosis: Axis I: Bipolar, Depressed    Axis II: No diagnosis    Glori Bickers, LCSW 10/01/2017

## 2017-10-03 ENCOUNTER — Other Ambulatory Visit: Payer: Self-pay

## 2017-10-03 DIAGNOSIS — M7989 Other specified soft tissue disorders: Secondary | ICD-10-CM

## 2017-10-06 LAB — HM PAP SMEAR: HM Pap smear: NEGATIVE

## 2017-10-13 ENCOUNTER — Encounter (HOSPITAL_COMMUNITY): Payer: Self-pay

## 2017-10-13 ENCOUNTER — Encounter: Payer: Medicaid Other | Admitting: Surgery

## 2017-10-20 ENCOUNTER — Encounter (HOSPITAL_COMMUNITY): Payer: Self-pay | Admitting: Licensed Clinical Social Worker

## 2017-10-20 ENCOUNTER — Ambulatory Visit (INDEPENDENT_AMBULATORY_CARE_PROVIDER_SITE_OTHER): Payer: Medicaid Other | Admitting: Licensed Clinical Social Worker

## 2017-10-20 DIAGNOSIS — F3160 Bipolar disorder, current episode mixed, unspecified: Secondary | ICD-10-CM

## 2017-10-20 NOTE — Progress Notes (Signed)
   THERAPIST PROGRESS NOTE  Session Time: 8:00 am-8:45 pm  Participation Level: Active  Behavioral Response: NeatAlertEuthymic  Type of Therapy: Individual Therapy  Treatment Goals addressed: Coping  Interventions: CBT and Solution Focused  Summary: Jane [name] is a 51 y.o. female who  presents oriented x5 (person, place, situation, time and object), alert, and depressed, and cooperative to address depression and anxiety. Patient has a history of medical including chronic back pain and mental health treatment including outpatient therapy and medication managment. She admits to symptoms of mania and notes that she experiences manic episodes in the winter time. Patient denies suicidal and homicidal ideations. She denies psychosis including auditory and visual hallucinations. Patient denies substance use. Patient is at no risk for lethality at this time.  Patient had an average score of 8 out of 10 on the Outcome Rating Scale which is an increase of 1.75.  Patient reported that things have improved with her daughter. She said that things are back to normal and her daughter acts like nothing even happened. Patient noted that she has tried to end things numerous times with her boyfriend by telling him not to come by or call but he calls and comes by. She is considering moving and not telling him where she has gone. Patient also noted that she is experiencing physical pain in her legs. She is trying to manage the pain with placing ice on her feet to go to sleep and that is helping some. Patient reported that she has good days and bad days. She has plans for Christmas but shared that the holidays are difficult for her due to all the loss she has experienced in her family. Patient committed to continue to manage mood appropriately and improve relationships with family. Patient rated the session 9 out of 10 on the Session Rating Scale.   Patient engaged in session. She responded well to interventions.  She continues to meet the criteria for Bipolar I disorder, most recent episode depressed. Patient will continue in outpatient therapy due to being the least restrictive service to meet her needs. Patient made moderate progress on her goals at this time.   Suicidal/Homicidal: Nowithout intent/plan  Therapist Response:  Therapist reviewed patient's recent thoughts and behaviors. Therapist utilized CBT to address mood. Therapist processed patient's feelings to identify triggers for mood. Therapist explored patent's relationship with her family and boyfriend as well as how she has been able to maintain the relationships. Therapist also discussed physical pain and the impact it can have on mood. Therapist committed patient to manage mood appropriately and improve relationships with family. Therapist administered the Outcome Rating Scale and the Session Rating Scale.   Plan: Return again in 2-3 weeks. Therapist will review patient goals on or before 03.19.2019  Diagnosis: Axis I: Bipolar, Depressed    Axis II: No diagnosis    Glori Bickers, LCSW 10/20/2017

## 2017-10-23 ENCOUNTER — Other Ambulatory Visit (HOSPITAL_COMMUNITY): Payer: Self-pay | Admitting: Psychiatry

## 2017-11-05 IMAGING — US US EXTREM LOW VENOUS*L*
1 series · 13 of 24 positions shown · non-contrast
Comparison: None.

CLINICAL DATA: Swelling of the left leg, chronic. Edema. Anti
coagulation therapy.



[Series 1: us extrem low venous*left* · 0.09mm/px · 13 of 39 slices shown]
[im 1/39]
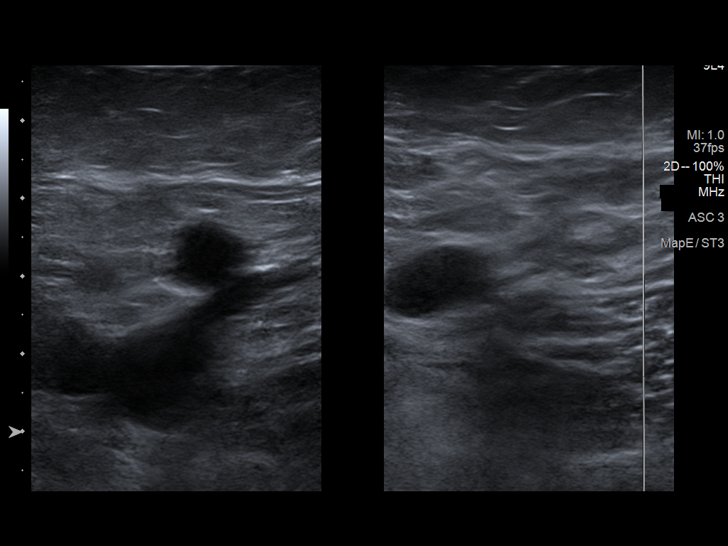
[im 4/39]
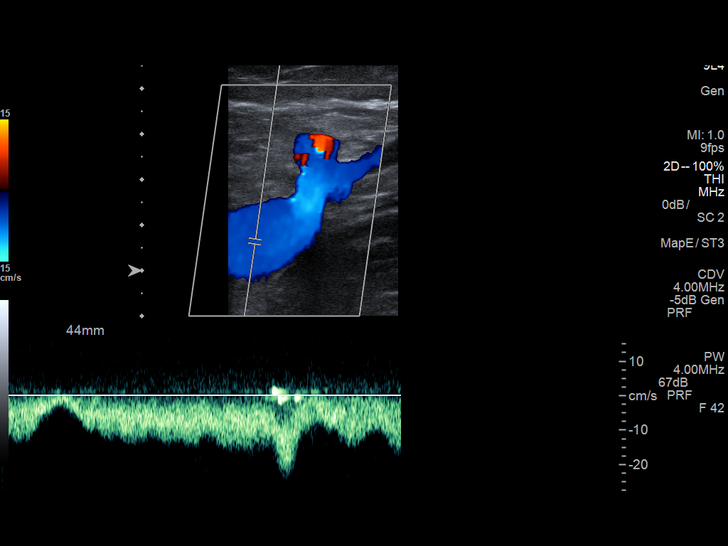
[im 7/39]
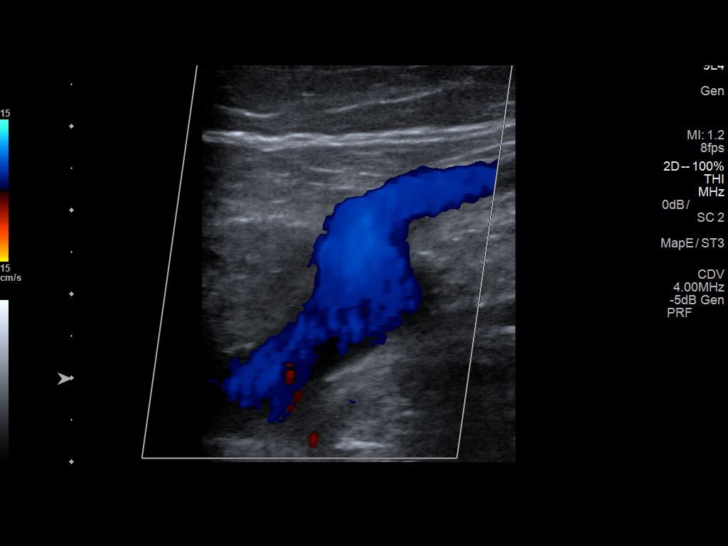
[im 10/39]
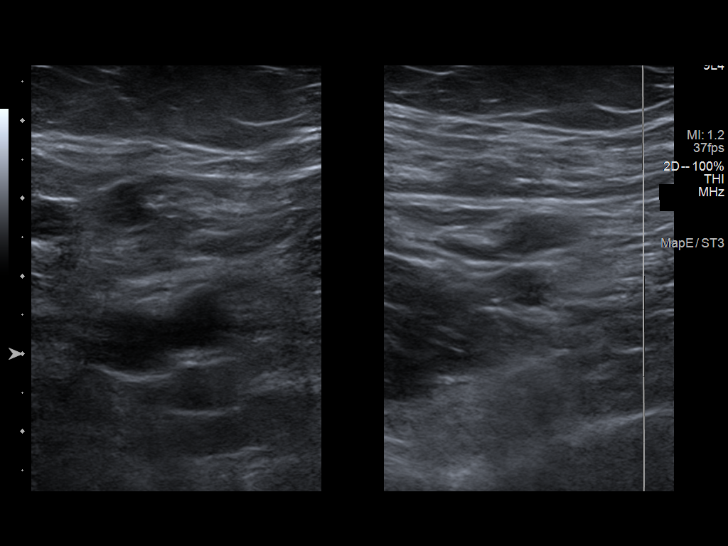
[im 14/39]
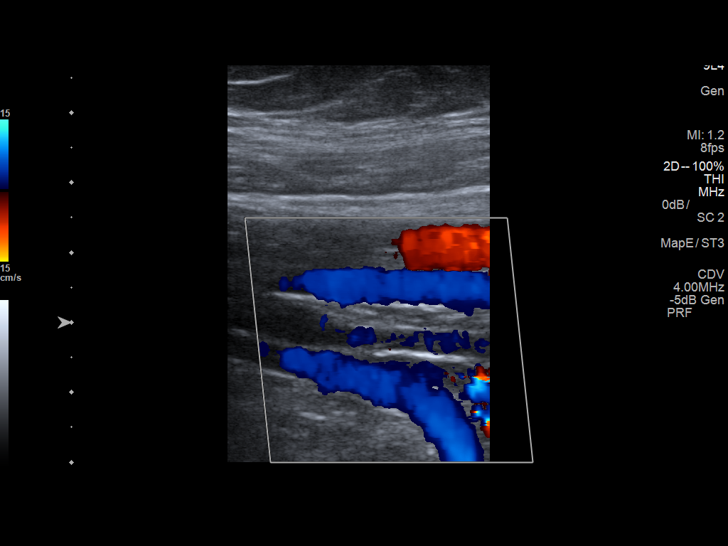
[im 17/39]
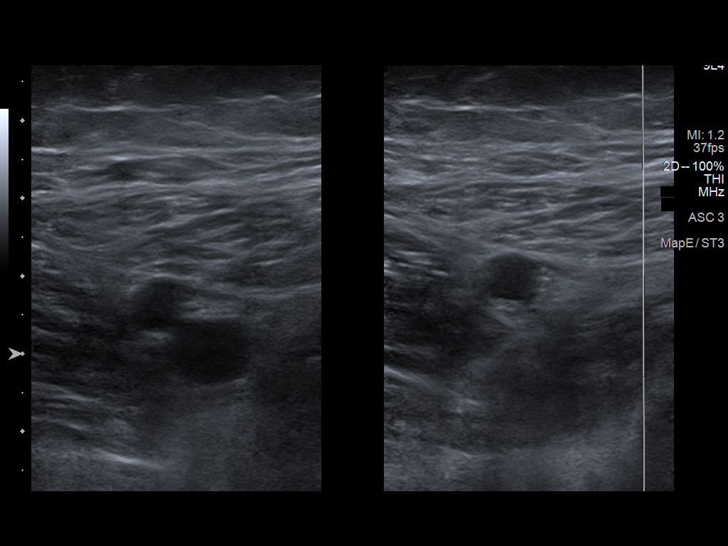
[im 20/39]
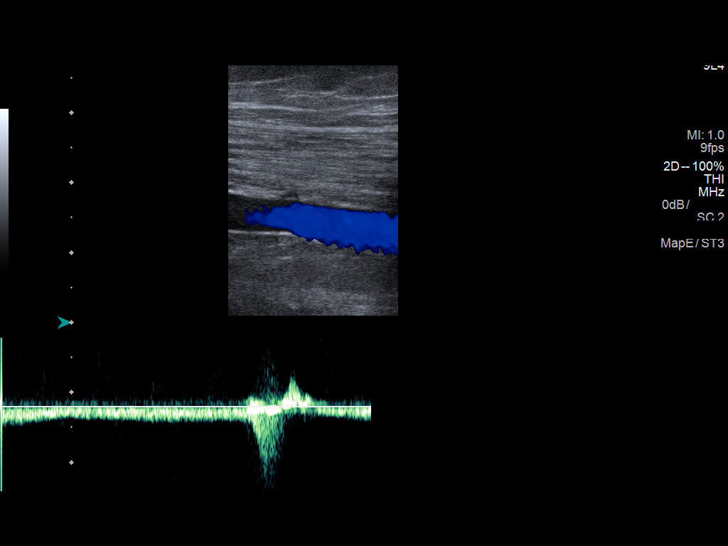
[im 22/39]
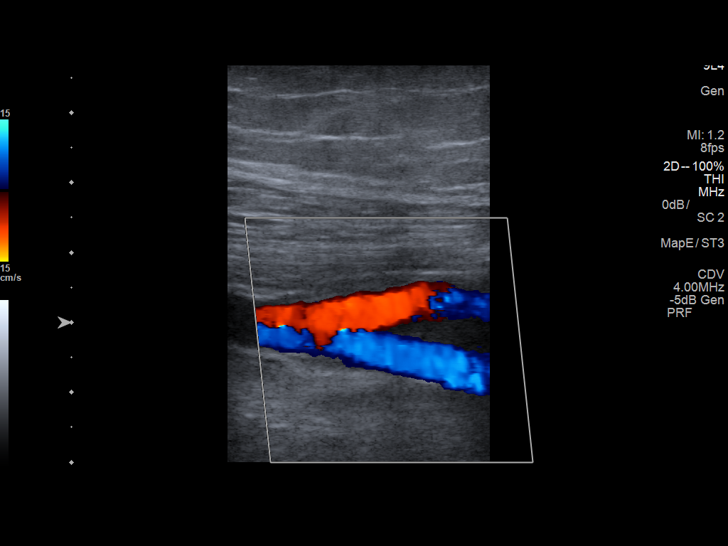
[im 25/39]
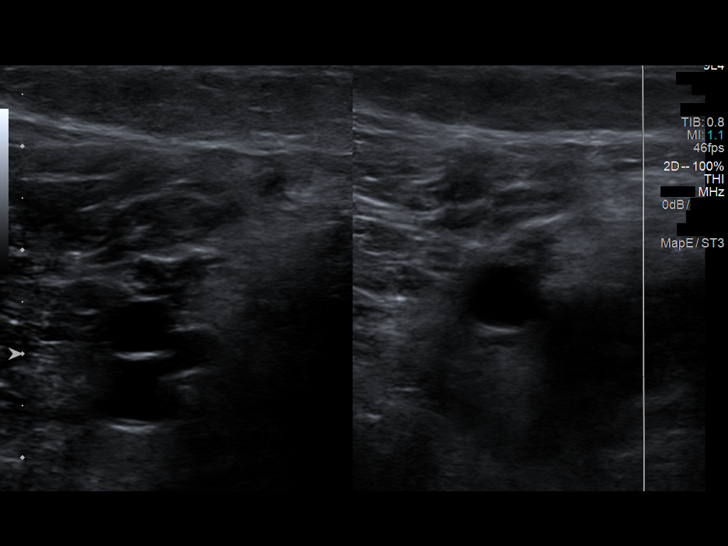
[im 29/39]
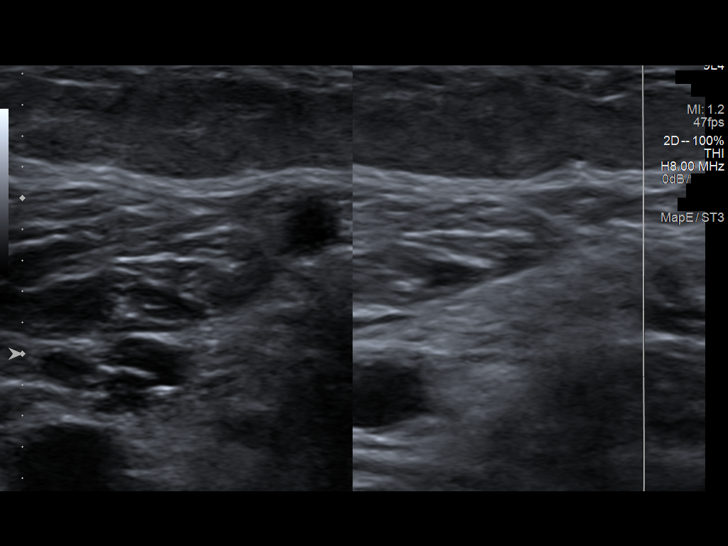
[im 32/39]
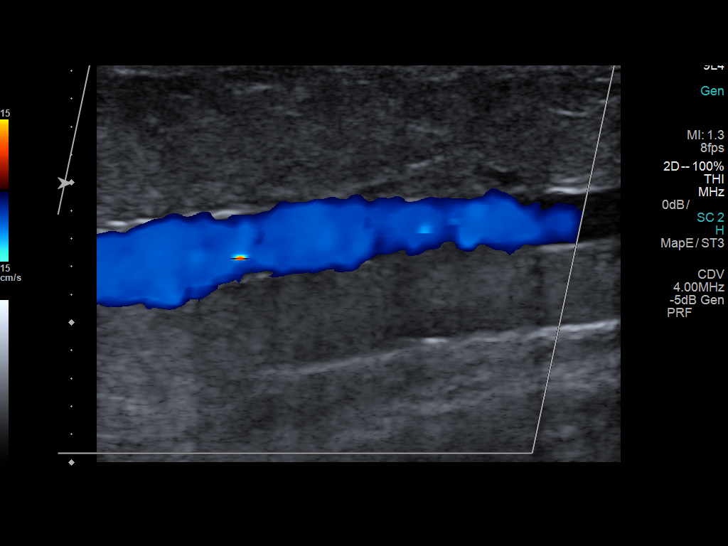
[im 35/39]
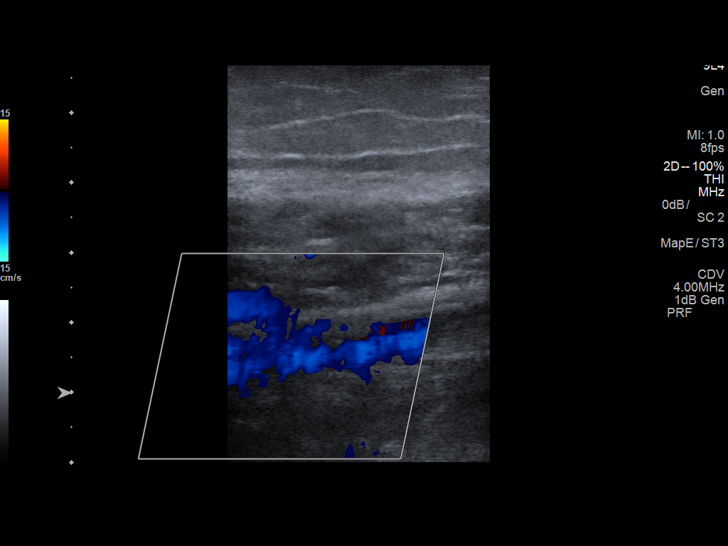
[im 39/39]
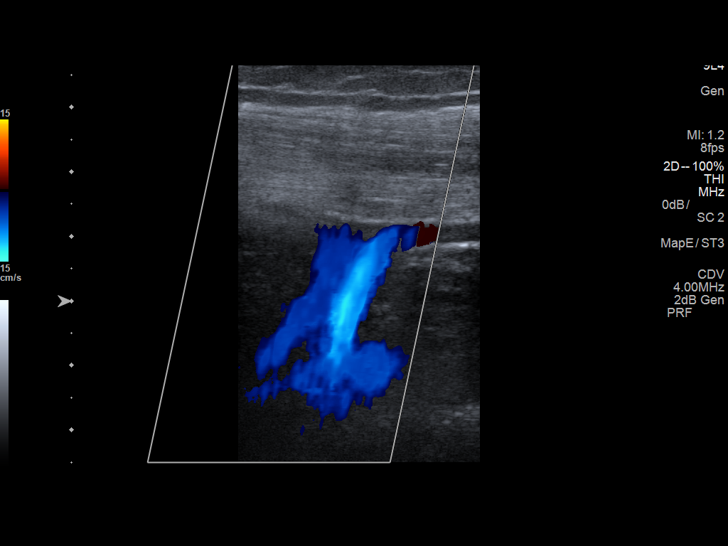

[13 of 24 positions shown; findings below may reference images not displayed]

FINDINGS: Contralateral Common Femoral Vein: Respiratory phasicity is normal
and symmetric with the symptomatic side. No evidence of thrombus.
Normal compressibility.

Common Femoral Vein: No evidence of thrombus. Normal
compressibility, respiratory phasicity and response to augmentation.

Saphenofemoral Junction: No evidence of thrombus. Normal
compressibility and flow on color Doppler imaging.

Profunda Femoral Vein: No evidence of thrombus. Normal
compressibility and flow on color Doppler imaging.

Femoral Vein: No evidence of thrombus. Normal compressibility,
respiratory phasicity and response to augmentation.

Popliteal Vein: No evidence of thrombus. Normal compressibility,
respiratory phasicity and response to augmentation.

Calf Veins: No evidence of thrombus. Normal compressibility and flow
on color Doppler imaging.

Superficial Great Saphenous Vein: No evidence of thrombus. Normal
compressibility and flow on color Doppler imaging.

Venous Reflux:  None.

Other Findings:  None.
IMPRESSION: No evidence of DVT within the left lower extremity.

## 2017-11-11 ENCOUNTER — Other Ambulatory Visit: Payer: Self-pay | Admitting: Orthopedic Surgery

## 2017-11-12 ENCOUNTER — Encounter (HOSPITAL_COMMUNITY): Payer: Self-pay | Admitting: Psychiatry

## 2017-11-12 ENCOUNTER — Ambulatory Visit (INDEPENDENT_AMBULATORY_CARE_PROVIDER_SITE_OTHER): Payer: Medicaid Other | Admitting: Psychiatry

## 2017-11-12 ENCOUNTER — Ambulatory Visit (HOSPITAL_COMMUNITY): Payer: Self-pay | Admitting: Psychiatry

## 2017-11-12 VITALS — BP 158/90 | HR 102 | Ht 70.0 in | Wt 254.0 lb

## 2017-11-12 DIAGNOSIS — R450 Nervousness: Secondary | ICD-10-CM | POA: Diagnosis not present

## 2017-11-12 DIAGNOSIS — F419 Anxiety disorder, unspecified: Secondary | ICD-10-CM | POA: Diagnosis not present

## 2017-11-12 DIAGNOSIS — Z818 Family history of other mental and behavioral disorders: Secondary | ICD-10-CM | POA: Diagnosis not present

## 2017-11-12 DIAGNOSIS — Z813 Family history of other psychoactive substance abuse and dependence: Secondary | ICD-10-CM

## 2017-11-12 DIAGNOSIS — G4700 Insomnia, unspecified: Secondary | ICD-10-CM | POA: Diagnosis not present

## 2017-11-12 DIAGNOSIS — Z736 Limitation of activities due to disability: Secondary | ICD-10-CM | POA: Diagnosis not present

## 2017-11-12 DIAGNOSIS — F3160 Bipolar disorder, current episode mixed, unspecified: Secondary | ICD-10-CM

## 2017-11-12 MED ORDER — LISDEXAMFETAMINE DIMESYLATE 50 MG PO CAPS: 50.0000 mg | ORAL_CAPSULE | ORAL | 0 refills | Status: DC

## 2017-11-12 MED ORDER — LISDEXAMFETAMINE DIMESYLATE 50 MG PO CAPS: 50.0000 mg | ORAL_CAPSULE | Freq: Every day | ORAL | 0 refills | Status: DC

## 2017-11-12 MED ORDER — TRAZODONE HCL 50 MG PO TABS: 50.0000 mg | ORAL_TABLET | Freq: Every day | ORAL | 2 refills | Status: DC

## 2017-11-12 MED ORDER — ALPRAZOLAM 1 MG PO TABS: 1.0000 mg | ORAL_TABLET | Freq: Two times a day (BID) | ORAL | 2 refills | Status: DC

## 2017-11-12 MED ORDER — DULOXETINE HCL 60 MG PO CPEP: 60.0000 mg | ORAL_CAPSULE | Freq: Two times a day (BID) | ORAL | 2 refills | Status: DC

## 2017-11-12 NOTE — Progress Notes (Signed)
[place] MD/PA/NP OP Progress Note  11/12/2017 3:23 PM Jane [name]  MRN:  [id number]  Chief Complaint:  Chief Complaint    Depression; Anxiety; ADHD; Follow-up     HPI: This patient is a 52 year-old divorced white female who lives alone in [place]. She is on disability due to bipolar disorder.  The patient states that she became very depressed when her mother died in 02-16-00 of a brain aneurysm. She started getting. Sad but eventually became manic and develop mood swings and agitated behavior. She's been on numerous medications over the years. In 1988/02/16 she also had postpartum depression. For many years she was on lithium but it caused a bad taste in her mouth and her doctor was worried it might be starting to affect her renal function so was discontinued. She also thinks that it stopped working after a number of years  She returns after 2 months.  She states that she is been very anxious lately.  She wants to break up with her boyfriend of 18 years but does not know how to cut it off.  He promised that they would move in together last summer but he keeps avoiding this.  He obviously does not want to make a real commitment.  Yet he calls and talks to her every night for hours and wants to know everything about her life and is very controlling.  When she is try to break it off before he keeps coming over until she gives in.  I explained that if she was really serious about this she would need to be adamant about not talking or seeing him.  She claims that she is serious about it.  She is not sleeping well and has been eating at night.  She is gained close to 30 pounds since last summer.  I suggested we switch from Adderall to Vyvanse which is better for binge eating disorder.  We can also add trazodone to help her sleep so she does not get up in the middle the night and snack.  She denies being seriously depressed but the whole decision about her boyfriend is making her very anxious Visit Diagnosis:    ICD-10-CM    1. Bipolar I disorder, most recent episode mixed ([place]) F31.60     Past Psychiatric History: Long-term outpatient treatment  Past Medical History:  Past Medical History:  Diagnosis Date  . Anxiety   . Bipolar disorder ([place])   . Depression   . DVT (deep venous thrombosis) ([place])   . Headache(784.0)   . Hypertension   . Hypothyroidism   . Obsessive-compulsive disorder   . Thyroid disease     Past Surgical History:  Procedure Laterality Date  . BACK SURGERY    . CHOLECYSTECTOMY    . SPINE SURGERY      Family Psychiatric History: See below  Family History:  Family History  Problem Relation Age of Onset  . Heart disease Mother   . Hyperlipidemia Mother   . Hypertension Mother   . Bipolar disorder Mother   . Diabetes Father   . Heart disease Father   . Hyperlipidemia Father   . Hypertension Father   . Hypertension Sister   . Hypertension Brother   . Hyperlipidemia Brother   . Heart disease Brother   . Bipolar disorder Maternal Aunt   . Suicidality Maternal Aunt   . Alcohol abuse Neg Hx   . Dementia Neg Hx   . OCD Neg Hx   . Paranoid behavior Neg Hx   .  Schizophrenia Neg Hx   . Seizures Neg Hx   . Sexual abuse Neg Hx   . Physical abuse Neg Hx   . Drug abuse Daughter   . ADD / ADHD Daughter   . Drug abuse Daughter   . Anxiety disorder Daughter   . Bipolar disorder Daughter     Social History:  Social History   Socioeconomic History  . Marital status: Divorced    Spouse name: None  . Number of children: None  . Years of education: None  . Highest education level: None  Social Needs  . Financial resource strain: None  . Food insecurity - worry: None  . Food insecurity - inability: None  . Transportation needs - medical: None  . Transportation needs - non-medical: None  Occupational History  . None  Tobacco Use  . Smoking status: Never Smoker  . Smokeless tobacco: Never Used  Substance and Sexual Activity  . Alcohol use: No  . Drug use: No  .  Sexual activity: Yes    Partners: Male    Birth control/protection: Surgical  Other Topics Concern  . None  Social History Narrative  . None    Allergies:  Allergies  Allergen Reactions  . Abilify [Aripiprazole] Other (See Comments)    Very violent  . Gabapentin Other (See Comments)    Nightmares and talking to her nightmares  . Thorazine [Chlorpromazine] Other (See Comments)    Shakes, anxiety off the roof and panic attack  . Seroquel [Quetiapine Fumarate]     Metabolic Disorder Labs: No results found for: HGBA1C, MPG No results found for: PROLACTIN No results found for: CHOL, TRIG, HDL, CHOLHDL, VLDL, LDLCALC No results found for: TSH  Therapeutic Level Labs: No results found for: LITHIUM Lab Results  Component Value Date   VALPROATE 61.2 03/23/2014   No components found for:  CBMZ  Current Medications: Current Outpatient Medications  Medication Sig Dispense Refill  . ALPRAZolam (XANAX) 1 MG tablet Take 1 tablet (1 mg total) by mouth 2 (two) times daily. 60 tablet 2  . amLODipine (NORVASC) 5 MG tablet Take 5 mg by mouth daily.    Marland Kitchen aspirin-acetaminophen-caffeine (EXCEDRIN MIGRAINE) 250-250-65 MG tablet Take by mouth every 6 (six) hours as needed for headache.    . ATORVASTATIN CALCIUM PO Take 20 mg by mouth daily.     . Chlorpheniramine Maleate (ALLERGY RELIEF PO) Take by mouth daily as needed.     . Cholecalciferol (VITAMIN D PO) Take 2,000 mg by mouth daily.    . cyclobenzaprine (FLEXERIL) 10 MG tablet Take 10 mg by mouth daily as needed for muscle spasms.    . cycloSPORINE (RESTASIS) 0.05 % ophthalmic emulsion Place 1 drop into both eyes 2 (two) times daily.    . DULoxetine (CYMBALTA) 60 MG capsule Take 1 capsule (60 mg total) by mouth 2 (two) times daily. 60 capsule 2  . furosemide (LASIX) 20 MG tablet Take 20 mg by mouth daily as needed.    Marland Kitchen HYDROcodone-acetaminophen (NORCO) 10-325 MG per tablet Take 1 tablet by mouth 4 (four) times daily as needed.     Marland Kitchen  levothyroxine (SYNTHROID, LEVOTHROID) 175 MCG tablet Take 1 tablet (175 mcg total) by mouth daily before breakfast. (Patient taking differently: Take 137 mcg by mouth daily before breakfast. ) 30 tablet 2  . linaclotide (LINZESS) 145 MCG CAPS capsule Take by mouth.    Marland Kitchen MAGNESIUM CITRATE PO Take 250 mg by mouth daily.    . Melatonin 5 MG  TABS Take 10 mg by mouth at bedtime.    . Multiple Vitamins-Minerals (MULTIVITAMIN WITH MINERALS) tablet Take 1 tablet by mouth daily.    . NON FORMULARY Amberen for Menopause Relief QD    . pantoprazole (PROTONIX) 40 MG tablet Take 40 mg by mouth daily.    Marland Kitchen POTASSIUM PO Take 10 mcg 3 (three) times daily by mouth.     Marland Kitchen rOPINIRole (REQUIP) 2 MG tablet Take 2 mg by mouth 2 (two) times daily.    . vitamin C (ASCORBIC ACID) 500 MG tablet Take 1,000 mg by mouth daily.     Marland Kitchen lisdexamfetamine (VYVANSE) 50 MG capsule Take 1 capsule (50 mg total) by mouth every morning. 30 capsule 0  . lisdexamfetamine (VYVANSE) 50 MG capsule Take 1 capsule (50 mg total) by mouth daily. 30 capsule 0  . traZODone (DESYREL) 50 MG tablet Take 1 tablet (50 mg total) by mouth at bedtime. 30 tablet 2   No current facility-administered medications for this visit.      Musculoskeletal: Strength & Muscle Tone: within normal limits Gait & Station: normal Patient leans: N/A  Psychiatric Specialty Exam: Review of Systems  Musculoskeletal: Positive for joint pain.  Psychiatric/Behavioral: The patient is nervous/anxious and has insomnia.   All other systems reviewed and are negative.   Blood pressure (!) 158/90, pulse (!) 102, height 5\' 10"  (1.778 m), weight 254 lb (115.2 kg), SpO2 94 %.Body mass index is 36.45 kg/m.  General Appearance: Casual and Fairly Groomed  Eye Contact:  Good  Speech:  Clear and Coherent  Volume:  Normal  Mood:  Anxious and Irritable  Affect:  Constricted  Thought Process:  Goal Directed  Orientation:  Full (Time, Place, and Person)  Thought Content:  Rumination   Suicidal Thoughts:  No  Homicidal Thoughts:  No  Memory:  Immediate;   Good Recent;   Good Remote;   Good  Judgement:  Fair  Insight:  Fair  Psychomotor Activity:  Normal  Concentration:  Concentration: Fair and Attention Span: Fair  Recall:  Good  Fund of Knowledge: Good  Language: Good  Akathisia:  No  Handed:  Right  AIMS (if indicated): not done  Assets:  Communication Skills Desire for Improvement Resilience Social Support Talents/Skills  ADL's:  Intact  Cognition: WNL  Sleep:  Poor   Screenings: PHQ2-9     Nutrition from 09/22/2014 in [facility]  PHQ-2 Total Score  4  PHQ-9 Total Score  15       Assessment and Plan: This patient is a 52 year old female with a history of bipolar disorder.  She seems to be more anxious and trying to make a decision regarding her boyfriend.  She is not sleeping well at night and getting up to snack.  We will add trazodone 50 mg at bedtime to help her with sleep.  She will continue Cymbalta 60 mg twice a day for depression and Xanax 1 mg twice a day for anxiety.  She will discontinue Adderall and start Vyvanse 50 mg each morning for ADHD as well as binge eating disorder.  She will return to see me in 2 months and continue her counseling here   [name], MD 11/12/2017, 3:23 PM

## 2017-11-17 ENCOUNTER — Ambulatory Visit (HOSPITAL_COMMUNITY): Payer: Self-pay | Admitting: Licensed Clinical Social Worker

## 2017-11-18 ENCOUNTER — Telehealth (HOSPITAL_COMMUNITY): Payer: Self-pay | Admitting: *Deleted

## 2017-11-18 NOTE — Telephone Encounter (Signed)
Patient is making appointment with Butch Penny to come back in

## 2017-11-18 NOTE — Telephone Encounter (Signed)
Dr Jane [name] with patient & she states that the medication given last week on visit isn't working.  asked which meds are we talking about  & she stated that the Desyrel isn't helping her to sleep --  so she has stopped taking  & the Vyvanse is giving her a very very dry mouth with no relief & she   has stopped taking. She states she is very down & need help/relief & she said "I not suicidal"

## 2017-11-18 NOTE — Telephone Encounter (Signed)
We probably should schedule her to come back in

## 2017-11-19 ENCOUNTER — Ambulatory Visit (HOSPITAL_COMMUNITY): Payer: Self-pay | Admitting: Psychiatry

## 2017-11-20 ENCOUNTER — Encounter (HOSPITAL_COMMUNITY): Payer: Self-pay | Admitting: Psychiatry

## 2017-11-20 ENCOUNTER — Ambulatory Visit (INDEPENDENT_AMBULATORY_CARE_PROVIDER_SITE_OTHER): Payer: Medicaid Other | Admitting: Psychiatry

## 2017-11-20 VITALS — BP 135/91 | HR 100 | Ht 70.0 in | Wt 251.0 lb

## 2017-11-20 DIAGNOSIS — Z818 Family history of other mental and behavioral disorders: Secondary | ICD-10-CM | POA: Diagnosis not present

## 2017-11-20 DIAGNOSIS — F909 Attention-deficit hyperactivity disorder, unspecified type: Secondary | ICD-10-CM

## 2017-11-20 DIAGNOSIS — F3160 Bipolar disorder, current episode mixed, unspecified: Secondary | ICD-10-CM | POA: Diagnosis not present

## 2017-11-20 DIAGNOSIS — Z630 Problems in relationship with spouse or partner: Secondary | ICD-10-CM | POA: Diagnosis not present

## 2017-11-20 DIAGNOSIS — Z813 Family history of other psychoactive substance abuse and dependence: Secondary | ICD-10-CM

## 2017-11-20 DIAGNOSIS — Z79899 Other long term (current) drug therapy: Secondary | ICD-10-CM | POA: Diagnosis not present

## 2017-11-20 MED ORDER — AMPHETAMINE-DEXTROAMPHETAMINE 30 MG PO TABS: 30.0000 mg | ORAL_TABLET | Freq: Two times a day (BID) | ORAL | 0 refills | Status: DC

## 2017-11-20 NOTE — Progress Notes (Signed)
[facility] MD/PA/NP OP Progress Note  11/20/2017 11:01 AM Jane [name]  MRN:  [id number]  Chief Complaint:  Chief Complaint    Depression; Anxiety; ADHD; Follow-up     HPI: This patient is a 52year-old divorced white female who lives alone in [place]. She is on disability due to bipolar disorder.  The patient states that she became very depressed when her mother died in 02/19/00 of a brain aneurysm. She started getting. Sad but eventually became manic and develop mood swings and agitated behavior. She's been on numerous medications over the years. In 02-19-1988 she also had postpartum depression. For many years she was on lithium but it caused a bad taste in her mouth and her doctor was worried it might be starting to affect her renal function so was discontinued. She also thinks that it stopped working after a number of years  The patient returns after 52 week.  She states that since we changed her Adderall to Vyvanse she has not been doing well.  Her mind is fuzzy and she cannot think.  She also broke up with her boyfriend of 18 years last week and now she "does not know what to do with her self."  She knows she could go forward in her life with him around but is going to take some time to get her priorities readjusted.  She feels depressed but not suicidal.  I explained that this would take time for her to decide how she wants to go forward.  For a while she was not sleeping but now she is sleeping too much.  The trazodone made her feel bad so she is not going to continue it. Visit Diagnosis:    ICD-10-CM   1. Bipolar I disorder, most recent episode mixed ([place]) F31.60     Past Psychiatric History: Long-term outpatient treatment for bipolar disorder  Past Medical History:  Past Medical History:  Diagnosis Date  . Anxiety   . Bipolar disorder ([place])   . Depression   . DVT (deep venous thrombosis) ([place])   . Headache(784.0)   . Hypertension   . Hypothyroidism   . Obsessive-compulsive disorder   .  Thyroid disease     Past Surgical History:  Procedure Laterality Date  . BACK SURGERY    . CHOLECYSTECTOMY    . SPINE SURGERY       Family Psychiatric History: See below  Family History:  Family History  Problem Relation Age of Onset  . Heart disease Mother   . Hyperlipidemia Mother   . Hypertension Mother   . Bipolar disorder Mother   . Diabetes Father   . Heart disease Father   . Hyperlipidemia Father   . Hypertension Father   . Hypertension Sister   . Hypertension Brother   . Hyperlipidemia Brother   . Heart disease Brother   . Bipolar disorder Maternal Aunt   . Suicidality Maternal Aunt   . Alcohol abuse Neg Hx   . Dementia Neg Hx   . OCD Neg Hx   . Paranoid behavior Neg Hx   . Schizophrenia Neg Hx   . Seizures Neg Hx   . Sexual abuse Neg Hx   . Physical abuse Neg Hx   . Drug abuse Daughter   . ADD / ADHD Daughter   . Drug abuse Daughter   . Anxiety disorder Daughter   . Bipolar disorder Daughter     Social History:  Social History   Socioeconomic History  . Marital status: Divorced  Spouse name: None  . Number of children: None  . Years of education: None  . Highest education level: None  Social Needs  . Financial resource strain: None  . Food insecurity - worry: None  . Food insecurity - inability: None  . Transportation needs - medical: None  . Transportation needs - non-medical: None  Occupational History  . None  Tobacco Use  . Smoking status: Never Smoker  . Smokeless tobacco: Never Used  Substance and Sexual Activity  . Alcohol use: No  . Drug use: No  . Sexual activity: Yes    Partners: Male    Birth control/protection: Surgical  Other Topics Concern  . None  Social History Narrative  . None    Allergies:  Allergies  Allergen Reactions  . Abilify [Aripiprazole] Other (See Comments)    Very violent  . Gabapentin Other (See Comments)    Nightmares and talking to her nightmares  . Thorazine [Chlorpromazine] Other (See  Comments)    Shakes, anxiety off the roof and panic attack  . Seroquel [Quetiapine Fumarate]     Metabolic Disorder Labs: No results found for: HGBA1C, MPG No results found for: PROLACTIN No results found for: CHOL, TRIG, HDL, CHOLHDL, VLDL, LDLCALC No results found for: TSH  Therapeutic Level Labs: No results found for: LITHIUM Lab Results  Component Value Date   VALPROATE 61.2 03/23/2014   No components found for:  CBMZ  Current Medications: Current Outpatient Medications  Medication Sig Dispense Refill  . ALPRAZolam (XANAX) 1 MG tablet Take 1 tablet (1 mg total) by mouth 2 (two) times daily. 60 tablet 2  . amLODipine (NORVASC) 5 MG tablet Take 5 mg by mouth daily.    Marland Kitchen aspirin-acetaminophen-caffeine (EXCEDRIN MIGRAINE) 250-250-65 MG tablet Take by mouth every 6 (six) hours as needed for headache.    . ATORVASTATIN CALCIUM PO Take 20 mg by mouth daily.     . Chlorpheniramine Maleate (ALLERGY RELIEF PO) Take by mouth daily as needed.     . Cholecalciferol (VITAMIN D PO) Take 2,000 mg by mouth daily.    . cyclobenzaprine (FLEXERIL) 10 MG tablet Take 10 mg by mouth daily as needed for muscle spasms.    . cycloSPORINE (RESTASIS) 0.05 % ophthalmic emulsion Place 1 drop into both eyes 2 (two) times daily.    . DULoxetine (CYMBALTA) 60 MG capsule Take 1 capsule (60 mg total) by mouth 2 (two) times daily. 60 capsule 2  . furosemide (LASIX) 20 MG tablet Take 20 mg by mouth daily as needed.    Marland Kitchen HYDROcodone-acetaminophen (NORCO) 10-325 MG per tablet Take 1 tablet by mouth 4 (four) times daily as needed.     Marland Kitchen levothyroxine (SYNTHROID, LEVOTHROID) 175 MCG tablet Take 1 tablet (175 mcg total) by mouth daily before breakfast. (Patient taking differently: Take 137 mcg by mouth daily before breakfast. ) 30 tablet 2  . linaclotide (LINZESS) 145 MCG CAPS capsule Take by mouth.    Marland Kitchen MAGNESIUM CITRATE PO Take 250 mg by mouth daily.    . Melatonin 5 MG TABS Take 10 mg by mouth at bedtime.    .  Multiple Vitamins-Minerals (MULTIVITAMIN WITH MINERALS) tablet Take 1 tablet by mouth daily.    . NON FORMULARY Amberen for Menopause Relief QD    . pantoprazole (PROTONIX) 40 MG tablet Take 40 mg by mouth daily.    Marland Kitchen POTASSIUM PO Take 10 mcg 3 (three) times daily by mouth.     Marland Kitchen rOPINIRole (REQUIP) 2 MG tablet  Take 2 mg by mouth 2 (two) times daily.    . vitamin C (ASCORBIC ACID) 500 MG tablet Take 1,000 mg by mouth daily.     Marland Kitchen amphetamine-dextroamphetamine (ADDERALL) 30 MG tablet Take 1 tablet by mouth 2 (two) times daily. 60 tablet 0   No current facility-administered medications for this visit.      Musculoskeletal: Strength & Muscle Tone: within normal limits Gait & Station: normal Patient leans: N/A  Psychiatric Specialty Exam: Review of Systems  Musculoskeletal: Positive for joint pain.  Psychiatric/Behavioral: Positive for depression.  All other systems reviewed and are negative.   Blood pressure (!) 135/91, pulse 100, height 5\' 10"  (1.778 m), weight 251 lb (113.9 kg), SpO2 98 %.Body mass index is 36.01 kg/m.  General Appearance: Casual and Fairly Groomed  Eye Contact:  Fair  Speech:  Clear and Coherent  Volume:  Normal  Mood:  Dysphoric  Affect:  Constricted  Thought Process:  Goal Directed  Orientation:  Full (Time, Place, and Person)  Thought Content: Rumination   Suicidal Thoughts:  No  Homicidal Thoughts:  No  Memory:  Immediate;   Good Recent;   Good Remote;   Fair  Judgement:  Fair  Insight:  Fair  Psychomotor Activity:  Decreased  Concentration:  Concentration: Fair and Attention Span: Fair  Recall:  Good  Fund of Knowledge: Good  Language: Good  Akathisia:  No  Handed:  Right  AIMS (if indicated): not done  Assets:  Communication Skills Desire for Improvement Resilience Social Support Talents/Skills  ADL's:  Intact  Cognition: WNL  Sleep:  Fair   Screenings: PHQ2-9     Nutrition from 09/22/2014 in [facility]   PHQ-2 Total Score  4  PHQ-9 Total Score  15       Assessment and Plan: Patient is a 52 year old female with a history of bipolar disorder and ADHD.  She recently made a big life change by breaking up with her boyfriend.  She is going to need to spend some time figuring out her priorities.  In the meantime she is feels like she did better with the Adderall 30 mg twice a day for ADHD so this will be reinstated.  She will continue Cymbalta 60 mg twice a day for depression.  She will continue her counseling here and return to see me in 4 weeks   [name], MD 11/20/2017, 11:01 AM

## 2017-11-21 ENCOUNTER — Encounter: Payer: Self-pay | Admitting: Vascular Surgery

## 2017-11-21 ENCOUNTER — Ambulatory Visit: Payer: Medicaid Other | Admitting: Vascular Surgery

## 2017-11-21 ENCOUNTER — Ambulatory Visit (HOSPITAL_COMMUNITY)
Admission: RE | Admit: 2017-11-21 | Discharge: 2017-11-21 | Disposition: A | Discharge status: Home / Self Care | Payer: Medicaid Other | Source: Ambulatory Visit | Attending: Surgery | Admitting: Surgery

## 2017-11-21 VITALS — BP 140/88 | HR 103 | Resp 18 | Ht 70.0 in | Wt 235.0 lb

## 2017-11-21 DIAGNOSIS — M7989 Other specified soft tissue disorders: Secondary | ICD-10-CM | POA: Diagnosis present

## 2017-11-21 DIAGNOSIS — I871 Compression of vein: Secondary | ICD-10-CM | POA: Diagnosis not present

## 2017-11-21 DIAGNOSIS — I872 Venous insufficiency (chronic) (peripheral): Secondary | ICD-10-CM | POA: Insufficient documentation

## 2017-11-21 NOTE — Progress Notes (Addendum)
Reason for referral: Swollen left leg  History of Present Illness  Jane [name] is a 52 y.o. female who presents with chief complaint: swollen leg.  Patient notes, onset of swelling since 2012 when she had an acute left LE DVT.  Left fem-pop DVT.  She was on anticoagulation for approximally 1 year.  She didn't notice recurrent swelling until about 1 year ago.  She is here today for evaluation of the left leg edema.    The patient has had a history of DVT, no history of varicose vein, no history of venous stasis ulcers, no history of  Lymphedema and no history of skin changes in lower legs.  There is a family history of venous disorders.  The patient has  used compression stockings in the past.  She has had three children in the past who are now in their 30's.    Past medical history includes: hyperlipidemia, HTN, pre-diabetic, and Depression.  She denise CAD.  She is on disability secondary to lumbar injury on the job followed by multiple surgeries.    Past Medical History:  Diagnosis Date  . Anxiety   . Bipolar disorder ([place])   . Depression   . DVT (deep venous thrombosis) ([place])   . Headache(784.0)   . Hypertension   . Hypothyroidism   . Obsessive-compulsive disorder   . Thyroid disease     Past Surgical History:  Procedure Laterality Date  . BACK SURGERY    . CHOLECYSTECTOMY    . SPINE SURGERY      Social History   Socioeconomic History  . Marital status: Divorced    Spouse name: Not on file  . Number of children: Not on file  . Years of education: Not on file  . Highest education level: Not on file  Social Needs  . Financial resource strain: Not on file  . Food insecurity - worry: Not on file  . Food insecurity - inability: Not on file  . Transportation needs - medical: Not on file  . Transportation needs - non-medical: Not on file  Occupational History  . Not on file  Tobacco Use  . Smoking status: Never Smoker  . Smokeless tobacco: Never Used  Substance  and Sexual Activity  . Alcohol use: No  . Drug use: No  . Sexual activity: Yes    Partners: Male    Birth control/protection: Surgical  Other Topics Concern  . Not on file  Social History Narrative  . Not on file    Family History  Problem Relation Age of Onset  . Heart disease Mother   . Hyperlipidemia Mother   . Hypertension Mother   . Bipolar disorder Mother   . Diabetes Father   . Heart disease Father   . Hyperlipidemia Father   . Hypertension Father   . Drug abuse Daughter   . ADD / ADHD Daughter   . Drug abuse Daughter   . Anxiety disorder Daughter   . Bipolar disorder Daughter   . Hypertension Sister   . Hypertension Brother   . Hyperlipidemia Brother   . Heart disease Brother   . Bipolar disorder Maternal Aunt   . Suicidality Maternal Aunt   . Alcohol abuse Neg Hx   . Dementia Neg Hx   . OCD Neg Hx   . Paranoid behavior Neg Hx   . Schizophrenia Neg Hx   . Seizures Neg Hx   . Sexual abuse Neg Hx   . Physical abuse Neg Hx  Current Outpatient Medications on File Prior to Visit  Medication Sig Dispense Refill  . ALPRAZolam (XANAX) 1 MG tablet Take 1 tablet (1 mg total) by mouth 2 (two) times daily. 60 tablet 2  . amLODipine (NORVASC) 5 MG tablet Take 5 mg by mouth daily.    Marland Kitchen amphetamine-dextroamphetamine (ADDERALL) 30 MG tablet Take 1 tablet by mouth 2 (two) times daily. 60 tablet 0  . aspirin-acetaminophen-caffeine (EXCEDRIN MIGRAINE) 250-250-65 MG tablet Take by mouth every 6 (six) hours as needed for headache.    . ATORVASTATIN CALCIUM PO Take 20 mg by mouth daily.     . Cholecalciferol (VITAMIN D PO) Take 2,000 mg by mouth daily.    . cycloSPORINE (RESTASIS) 0.05 % ophthalmic emulsion Place 1 drop into both eyes 2 (two) times daily.    Marland Kitchen levothyroxine (SYNTHROID, LEVOTHROID) 175 MCG tablet Take 1 tablet (175 mcg total) by mouth daily before breakfast. (Patient taking differently: Take 137 mcg by mouth daily before breakfast. ) 30 tablet 2  .  linaclotide (LINZESS) 145 MCG CAPS capsule Take by mouth.    Marland Kitchen MAGNESIUM CITRATE PO Take 250 mg by mouth daily.    . Melatonin 5 MG TABS Take 10 mg by mouth at bedtime.    . Multiple Vitamins-Minerals (MULTIVITAMIN WITH MINERALS) tablet Take 1 tablet by mouth daily.    . pantoprazole (PROTONIX) 40 MG tablet Take 40 mg by mouth daily.    Marland Kitchen rOPINIRole (REQUIP) 2 MG tablet Take 2 mg by mouth 2 (two) times daily.    . Chlorpheniramine Maleate (ALLERGY RELIEF PO) Take by mouth daily as needed.     . cyclobenzaprine (FLEXERIL) 10 MG tablet Take 10 mg by mouth daily as needed for muscle spasms.    . DULoxetine (CYMBALTA) 60 MG capsule Take 1 capsule (60 mg total) by mouth 2 (two) times daily. (Patient not taking: Reported on 11/21/2017) 60 capsule 2  . furosemide (LASIX) 20 MG tablet Take 20 mg by mouth daily as needed.    Marland Kitchen HYDROcodone-acetaminophen (NORCO) 10-325 MG per tablet Take 1 tablet by mouth 4 (four) times daily as needed.     . linaclotide (LINZESS) 145 MCG CAPS capsule Take by mouth.    . NON FORMULARY Amberen for Menopause Relief QD    . POTASSIUM PO Take 10 mcg 3 (three) times daily by mouth.     . vitamin C (ASCORBIC ACID) 500 MG tablet Take 1,000 mg by mouth daily.      No current facility-administered medications on file prior to visit.     Allergies as of 11/21/2017 - Review Complete 11/21/2017  Allergen Reaction Noted  . Abilify [aripiprazole] Other (See Comments) 04/05/2013  . Gabapentin Other (See Comments) 04/05/2013  . Thorazine [chlorpromazine] Other (See Comments) 04/05/2013  . Seroquel [quetiapine fumarate]  06/17/2013     ROS:   General:  No weight loss, Fever, chills  HEENT: No recent headaches, no nasal bleeding, no visual changes, no sore throat  Neurologic: No dizziness, blackouts, seizures. No recent symptoms of stroke or mini- stroke. No recent episodes of slurred speech, or temporary blindness.  Cardiac: No recent episodes of chest pain/pressure, no  shortness of breath at rest.  No shortness of breath with exertion.  Denies history of atrial fibrillation or irregular heartbeat  Vascular: No history of rest pain in feet.  No history of claudication.  No history of non-healing ulcer, +history of DVT   Pulmonary: No home oxygen, no productive cough, no hemoptysis,  No asthma  or wheezing  Musculoskeletal:  [ ]  Arthritis, [ ]  Low back pain,  [ ]  Joint pain  Hematologic:No history of hypercoagulable state.  No history of easy bleeding.  No history of anemia  Gastrointestinal: No hematochezia or melena,  No gastroesophageal reflux, no trouble swallowing  Urinary: [ ]  chronic Kidney disease, [ ]  on HD - [ ]  MWF or [ ]  TTHS, [ ]  Burning with urination, [ ]  Frequent urination, [ ]  Difficulty urinating;   Skin: No rashes  Psychological: No history of anxiety,  positve history of depression   Physical Examination  Vitals:   11/21/17 1333  BP: 140/88  Pulse: (!) 103  Resp: 18  SpO2: 98%  Weight: 235 lb (106.6 kg)  Height: 5\' 10"  (1.778 m)    Body mass index is 33.72 kg/m.  General:  Alert and oriented, no acute distress HEENT: Normal Neck: No bruit or JVD Pulmonary: Clear to auscultation bilaterally Cardiac: Regular Rate and Rhythm without murmur Abdomen: Soft, non-tender, non-distended, no mass, no scars Skin: No rash Extremity Pulses:  2+ radial, brachial, femoral, dorsalis pedis, posterior tibial pulses bilaterally Musculoskeletal: left LE is visibly larger in size than the right.  No skin changes or wounds noted.    Neurologic: Upper and lower extremity motor 5/5 and symmetric Psychiatric: Judgment intact, Mood & affect appropriate for pt's clinical situation Lymph : No Cervical, Axillary, or Inguinal lymphadenopathy    Non-invasive Vascular Imaging   Venous Reflux Times Normal value < 0.5 sec +--------------+----------+---------+        Right (ms)Left (ms) +--------------+----------+---------+ CFV            990.0   +--------------+----------+---------+ GSV prox thigh     667.0   +--------------+----------+---------+ GSV mid thigh      528.0   +--------------+----------+---------+ GSV dist thigh     697.0   +--------------+----------+---------+ GSV at knee       946.0   +--------------+----------+---------+ GSV prox calf      528.0   +--------------+----------+---------+  Vein Diameters: +-------------------+----------+---------------+           Right (mm)Left (mm)    +-------------------+----------+---------------+ GSV at SFJ          0.63       +-------------------+----------+---------------+ GSV at prox thigh      0.5       +-------------------+----------+---------------+ GSV at mid thigh       0.41       +-------------------+----------+---------------+ GSV at distal thigh     0.34       +-------------------+----------+---------------+ GSV at knee         0.34       +-------------------+----------+---------------+ GSV prox calf        0.33       +-------------------+----------+---------------+ SSV             0.29/0.33//0.38 +-------------------+----------+---------------+   Left common femoral vein and distal external iliac vein are retrograde. Right external iliac is normal. Left great saphenous vein at the saphenofemoral junction is retrograde. Flow direction normalizes in the proximal thigh.   Final Interpretation: Left: Abnormal reflux times were noted in the common femoral vein, great saphenous vein at the proximal thigh, great saphenous vein at the mid thigh, great saphenous vein at the distal thigh, great saphenous vein at the knee, and great saphenous vein at  the proximal calf. Abnormal flow patterns in the left extermal iliac vein, common femoral vein, and  saphenofemoral junction suggests proximal compression or obstruction.  *See table(s) above for  measurements and observations.  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 History of left LE fem-pop DVT  Plan: We want her to wear thigh high compression garments daily 20-30 mmhg.  Elevation when at rest.  Water therapy is available.  We will schedule her for a follow up visit in 6 months for re-evaluation.  She is not at risk of limb loss.  She may need a venogram to better evaluate the iliac venous system and possible intervention.  We will discuss this more at her follow visit.  Roxy Horseman PA-C [facility]  The patient was seen in conjunction with Dr. Bridgett Larsson today  Addendum  I have independently interviewed and examined the patient, and I agree with the physician assistant's findings.  Pt has prior history of LLE DVT and abnormal reflux studies suggestive of some type of proximal stenosis vs occlusion.  We discussed doing a LLE venogram, IVUS and possible intervention.  Given the imminent release of FDA approved venous stents, I recommended we delay another 6 months until the stent was available locally.    - Compressive therapy for 6 months - Re-evaluation in 6 months.   Adele Barthel, MD, FACS [facility] [place] Office: [phone] Pager: [phone]  11/21/2017, 3:12 PM

## 2017-11-25 ENCOUNTER — Other Ambulatory Visit: Payer: Self-pay

## 2017-11-25 ENCOUNTER — Encounter (HOSPITAL_BASED_OUTPATIENT_CLINIC_OR_DEPARTMENT_OTHER): Payer: Self-pay | Admitting: *Deleted

## 2017-11-27 ENCOUNTER — Encounter (HOSPITAL_COMMUNITY)
Admission: RE | Admit: 2017-11-27 | Discharge: 2017-11-27 | Disposition: A | Discharge status: Home / Self Care | Payer: Medicaid Other | Source: Ambulatory Visit | Attending: Orthopedic Surgery | Admitting: Orthopedic Surgery

## 2017-11-27 DIAGNOSIS — Z01810 Encounter for preprocedural cardiovascular examination: Secondary | ICD-10-CM | POA: Insufficient documentation

## 2017-11-27 DIAGNOSIS — M65311 Trigger thumb, right thumb: Secondary | ICD-10-CM | POA: Insufficient documentation

## 2017-11-27 LAB — BASIC METABOLIC PANEL
ANION GAP: 13 (ref 5–15)
BUN: 27 mg/dL — AB (ref 6–20)
CHLORIDE: 97 mmol/L — AB (ref 101–111)
CO2: 27 mmol/L (ref 22–32)
Calcium: 9.4 mg/dL (ref 8.9–10.3)
Creatinine, Ser: 1.29 mg/dL — ABNORMAL HIGH (ref 0.44–1.00)
GFR calc Af Amer: 55 mL/min — ABNORMAL LOW (ref >60)
GFR, EST NON AFRICAN AMERICAN: 47 mL/min — AB (ref >60)
GLUCOSE: 160 mg/dL — AB (ref 65–99)
POTASSIUM: 3.4 mmol/L — AB (ref 3.5–5.1)
SODIUM: 137 mmol/L (ref 135–145)

## 2017-11-28 ENCOUNTER — Encounter (HOSPITAL_COMMUNITY): Payer: Self-pay | Admitting: Licensed Clinical Social Worker

## 2017-11-28 ENCOUNTER — Ambulatory Visit (INDEPENDENT_AMBULATORY_CARE_PROVIDER_SITE_OTHER): Payer: Medicaid Other | Admitting: Licensed Clinical Social Worker

## 2017-11-28 DIAGNOSIS — F3130 Bipolar disorder, current episode depressed, mild or moderate severity, unspecified: Secondary | ICD-10-CM | POA: Diagnosis not present
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
## 2017-12-01 ENCOUNTER — Encounter (HOSPITAL_BASED_OUTPATIENT_CLINIC_OR_DEPARTMENT_OTHER): Payer: Self-pay | Admitting: Certified Registered Nurse Anesthetist

## 2017-12-01 ENCOUNTER — Ambulatory Visit (HOSPITAL_BASED_OUTPATIENT_CLINIC_OR_DEPARTMENT_OTHER): Payer: Medicaid Other | Admitting: Certified Registered Nurse Anesthetist

## 2017-12-01 ENCOUNTER — Other Ambulatory Visit: Payer: Self-pay

## 2017-12-01 ENCOUNTER — Ambulatory Visit (HOSPITAL_BASED_OUTPATIENT_CLINIC_OR_DEPARTMENT_OTHER)
Admission: RE | Admit: 2017-12-01 | Discharge: 2017-12-01 | Disposition: A | Discharge status: Home / Self Care | Payer: Medicaid Other | Source: Ambulatory Visit | Attending: Orthopedic Surgery | Admitting: Orthopedic Surgery

## 2017-12-01 ENCOUNTER — Encounter (HOSPITAL_BASED_OUTPATIENT_CLINIC_OR_DEPARTMENT_OTHER)
Admission: RE | Disposition: A | Discharge status: Home / Self Care | Payer: Self-pay | Source: Ambulatory Visit | Attending: Orthopedic Surgery

## 2017-12-01 DIAGNOSIS — Z79899 Other long term (current) drug therapy: Secondary | ICD-10-CM | POA: Insufficient documentation

## 2017-12-01 DIAGNOSIS — Z6836 Body mass index (BMI) 36.0-36.9, adult: Secondary | ICD-10-CM | POA: Insufficient documentation

## 2017-12-01 DIAGNOSIS — Z8249 Family history of ischemic heart disease and other diseases of the circulatory system: Secondary | ICD-10-CM | POA: Insufficient documentation

## 2017-12-01 DIAGNOSIS — Z86718 Personal history of other venous thrombosis and embolism: Secondary | ICD-10-CM | POA: Insufficient documentation

## 2017-12-01 DIAGNOSIS — E039 Hypothyroidism, unspecified: Secondary | ICD-10-CM | POA: Insufficient documentation

## 2017-12-01 DIAGNOSIS — F319 Bipolar disorder, unspecified: Secondary | ICD-10-CM | POA: Insufficient documentation

## 2017-12-01 DIAGNOSIS — Z888 Allergy status to other drugs, medicaments and biological substances status: Secondary | ICD-10-CM | POA: Diagnosis not present

## 2017-12-01 DIAGNOSIS — K219 Gastro-esophageal reflux disease without esophagitis: Secondary | ICD-10-CM | POA: Insufficient documentation

## 2017-12-01 DIAGNOSIS — F429 Obsessive-compulsive disorder, unspecified: Secondary | ICD-10-CM | POA: Insufficient documentation

## 2017-12-01 DIAGNOSIS — M65311 Trigger thumb, right thumb: Secondary | ICD-10-CM | POA: Insufficient documentation

## 2017-12-01 DIAGNOSIS — I10 Essential (primary) hypertension: Secondary | ICD-10-CM | POA: Insufficient documentation

## 2017-12-01 DIAGNOSIS — F419 Anxiety disorder, unspecified: Secondary | ICD-10-CM | POA: Insufficient documentation

## 2017-12-01 HISTORY — PX: TRIGGER FINGER RELEASE: SHX641

## 2017-12-01 HISTORY — DX: Gastro-esophageal reflux disease without esophagitis: K21.9

## 2017-12-01 SURGERY — RELEASE, A1 PULLEY, FOR TRIGGER FINGER
Anesthesia: Monitor Anesthesia Care | Site: Thumb | Laterality: Right

## 2017-12-01 MED ORDER — ONDANSETRON HCL 4 MG/2ML IJ SOLN: 4.0000 mg | Freq: Once | INTRAMUSCULAR | Status: DC | PRN

## 2017-12-01 MED ORDER — MIDAZOLAM HCL 5 MG/5ML IJ SOLN
INTRAMUSCULAR | Status: DC | PRN
  Administered 2017-12-01: 2 mg via INTRAVENOUS

## 2017-12-01 MED ORDER — FENTANYL CITRATE (PF) 100 MCG/2ML IJ SOLN: 25.0000 ug | INTRAMUSCULAR | Status: DC | PRN

## 2017-12-01 MED ORDER — LIDOCAINE HCL 2 % IJ SOLN
INTRAMUSCULAR | Status: DC | PRN
  Administered 2017-12-01: 3.5 mL

## 2017-12-01 MED ORDER — ACETAMINOPHEN 160 MG/5ML PO SOLN: 325.0000 mg | ORAL | Status: DC | PRN

## 2017-12-01 MED ORDER — OXYCODONE HCL 5 MG PO TABS: 5.0000 mg | ORAL_TABLET | Freq: Once | ORAL | Status: DC | PRN

## 2017-12-01 MED ORDER — SCOPOLAMINE 1 MG/3DAYS TD PT72: 1.0000 | MEDICATED_PATCH | Freq: Once | TRANSDERMAL | Status: DC | PRN

## 2017-12-01 MED ORDER — OXYCODONE HCL 5 MG/5ML PO SOLN: 5.0000 mg | Freq: Once | ORAL | Status: DC | PRN

## 2017-12-01 MED ORDER — CEFAZOLIN SODIUM-DEXTROSE 2-4 GM/100ML-% IV SOLN
INTRAVENOUS | Status: AC
  Filled 2017-12-01: qty 100

## 2017-12-01 MED ORDER — KETOROLAC TROMETHAMINE 30 MG/ML IJ SOLN: 30.0000 mg | Freq: Once | INTRAMUSCULAR | Status: DC | PRN

## 2017-12-01 MED ORDER — 0.9 % SODIUM CHLORIDE (POUR BTL) OPTIME
TOPICAL | Status: DC | PRN
  Administered 2017-12-01: 100 mL

## 2017-12-01 MED ORDER — ONDANSETRON HCL 4 MG/2ML IJ SOLN
INTRAMUSCULAR | Status: DC | PRN
  Administered 2017-12-01: 4 mg via INTRAVENOUS

## 2017-12-01 MED ORDER — MIDAZOLAM HCL 2 MG/2ML IJ SOLN: 1.0000 mg | INTRAMUSCULAR | Status: DC | PRN

## 2017-12-01 MED ORDER — ACETAMINOPHEN 325 MG PO TABS: 325.0000 mg | ORAL_TABLET | ORAL | Status: DC | PRN

## 2017-12-01 MED ORDER — MIDAZOLAM HCL 2 MG/2ML IJ SOLN
INTRAMUSCULAR | Status: AC
  Filled 2017-12-01: qty 2

## 2017-12-01 MED ORDER — FENTANYL CITRATE (PF) 100 MCG/2ML IJ SOLN: 50.0000 ug | INTRAMUSCULAR | Status: DC | PRN

## 2017-12-01 MED ORDER — FENTANYL CITRATE (PF) 100 MCG/2ML IJ SOLN
INTRAMUSCULAR | Status: DC | PRN
  Administered 2017-12-01: 25 ug via INTRAVENOUS

## 2017-12-01 MED ORDER — LIDOCAINE HCL 2 % IJ SOLN
INTRAMUSCULAR | Status: AC
  Filled 2017-12-01: qty 20

## 2017-12-01 MED ORDER — ONDANSETRON HCL 4 MG/2ML IJ SOLN
INTRAMUSCULAR | Status: AC
  Filled 2017-12-01: qty 2

## 2017-12-01 MED ORDER — LACTATED RINGERS IV SOLN
INTRAVENOUS | Status: DC
  Administered 2017-12-01: 15:00:00 via INTRAVENOUS

## 2017-12-01 MED ORDER — CHLORHEXIDINE GLUCONATE 4 % EX LIQD: 60.0000 mL | Freq: Once | CUTANEOUS | Status: DC

## 2017-12-01 MED ORDER — PROPOFOL 500 MG/50ML IV EMUL
INTRAVENOUS | Status: DC | PRN
  Administered 2017-12-01: 50 ug/kg/min via INTRAVENOUS

## 2017-12-01 MED ORDER — MEPERIDINE HCL 25 MG/ML IJ SOLN: 6.2500 mg | INTRAMUSCULAR | Status: DC | PRN

## 2017-12-01 MED ORDER — HYDROCODONE-ACETAMINOPHEN 5-325 MG PO TABS: ORAL_TABLET | ORAL | 0 refills | Status: DC

## 2017-12-01 MED ORDER — FENTANYL CITRATE (PF) 100 MCG/2ML IJ SOLN
INTRAMUSCULAR | Status: AC
  Filled 2017-12-01: qty 2

## 2017-12-01 MED ORDER — PROPOFOL 10 MG/ML IV BOLUS
INTRAVENOUS | Status: DC | PRN
  Administered 2017-12-01 (×2): 30 mg via INTRAVENOUS

## 2017-12-01 MED ORDER — BUPIVACAINE HCL (PF) 0.25 % IJ SOLN
INTRAMUSCULAR | Status: DC | PRN
  Administered 2017-12-01: 3.5 mL

## 2017-12-01 MED ORDER — CEFAZOLIN SODIUM-DEXTROSE 2-4 GM/100ML-% IV SOLN
2.0000 g | INTRAVENOUS | Status: AC
  Administered 2017-12-01: 2 g via INTRAVENOUS

## 2017-12-01 MED ORDER — ONDANSETRON HCL 4 MG/2ML IJ SOLN
4.0000 mg | Freq: Once | INTRAMUSCULAR | Status: DC | PRN
Start: 2017-12-01 — End: 2017-12-01

## 2017-12-01 SURGICAL SUPPLY — 32 items
BANDAGE COBAN STERILE 2 (GAUZE/BANDAGES/DRESSINGS) ×2 IMPLANT
BLADE SURG 15 STRL LF DISP TIS (BLADE) ×2 IMPLANT
BLADE SURG 15 STRL SS (BLADE) ×4
BNDG CMPR 9X4 STRL LF SNTH (GAUZE/BANDAGES/DRESSINGS) ×1
BNDG ESMARK 4X9 LF (GAUZE/BANDAGES/DRESSINGS) ×2 IMPLANT
CHLORAPREP W/TINT 26ML (MISCELLANEOUS) ×2 IMPLANT
CORD BIPOLAR FORCEPS 12FT (ELECTRODE) IMPLANT
COVER BACK TABLE 60X90IN (DRAPES) ×2 IMPLANT
COVER MAYO STAND STRL (DRAPES) ×2 IMPLANT
CUFF TOURNIQUET SINGLE 18IN (TOURNIQUET CUFF) IMPLANT
DRAPE EXTREMITY T 121X128X90 (DRAPE) ×2 IMPLANT
DRAPE SURG 17X23 STRL (DRAPES) ×2 IMPLANT
GAUZE SPONGE 4X4 12PLY STRL (GAUZE/BANDAGES/DRESSINGS) ×2 IMPLANT
GAUZE XEROFORM 1X8 LF (GAUZE/BANDAGES/DRESSINGS) ×2 IMPLANT
GLOVE BIO SURGEON STRL SZ7.5 (GLOVE) ×2 IMPLANT
GLOVE BIOGEL PI IND STRL 8 (GLOVE) ×1 IMPLANT
GLOVE BIOGEL PI IND STRL 8.5 (GLOVE) ×1 IMPLANT
GLOVE BIOGEL PI INDICATOR 8 (GLOVE) ×1
GLOVE BIOGEL PI INDICATOR 8.5 (GLOVE) ×1
GLOVE ECLIPSE 6.5 STRL STRAW (GLOVE) ×2 IMPLANT
GOWN STRL REUS W/ TWL LRG LVL3 (GOWN DISPOSABLE) ×1 IMPLANT
GOWN STRL REUS W/TWL LRG LVL3 (GOWN DISPOSABLE) ×2
GOWN STRL REUS W/TWL XL LVL3 (GOWN DISPOSABLE) ×2 IMPLANT
NEEDLE HYPO 25X1 1.5 SAFETY (NEEDLE) ×2 IMPLANT
NS IRRIG 1000ML POUR BTL (IV SOLUTION) ×2 IMPLANT
PACK BASIN DAY SURGERY FS (CUSTOM PROCEDURE TRAY) ×2 IMPLANT
STOCKINETTE 4X48 STRL (DRAPES) ×2 IMPLANT
SUT ETHILON 4 0 PS 2 18 (SUTURE) ×2 IMPLANT
SYR BULB 3OZ (MISCELLANEOUS) ×2 IMPLANT
SYR CONTROL 10ML LL (SYRINGE) ×2 IMPLANT
TOWEL OR 17X24 6PK STRL BLUE (TOWEL DISPOSABLE) ×4 IMPLANT
UNDERPAD 30X30 (UNDERPADS AND DIAPERS) ×2 IMPLANT

## 2017-12-01 NOTE — Discharge Instructions (Addendum)

## 2017-12-01 NOTE — Op Note (Signed)
12/01/2017 [facility] OPERATIVE REPORT   PREOPERATIVE DIAGNOSIS: Right trigger thumb.  POSTOPERATIVE DIAGNOSIS:  Right trigger thumb.  PROCEDURE: Right trigger thumb release.  SURGEON:  Leanora Cover, MD  ASSISTANT:  None.  ANESTHESIA:  local with sedation.  IV FLUIDS:  Per anesthesia flow sheet.  ESTIMATED BLOOD LOSS:  Minimal.  COMPLICATIONS:  None.  SPECIMENS:  None.  TOURNIQUET TIME:  Total Tourniquet Time Documented: Forearm (Right) - 8 minutes Total: Forearm (Right) - 8 minutes   DISPOSITION:  Stable to PACU.  LOCATION: [facility]  INDICATIONS: Jane [name] is a 52 y.o. female with triggering right thumb.  This has been injected without resolution.  She wishes to have a trigger release.  Risks, benefits and alternatives of surgery were discussed including the risk of blood loss, infection, damage to nerves, vessels, tendons, ligaments, bone, failure of surgery, need for additional surgery, complications with wound healing, continued pain, continued triggering and need for repeat surgery.  She voiced understanding of these risks and elected to proceed.  OPERATIVE COURSE:  After being identified preoperatively by myself, the patient and I agreed upon the procedure and site of procedure.  The surgical site was marked. The risks, benefits, and alternatives of surgery were reviewed and she wished to proceed.  Surgical consent had been signed. She was given IV Ancef as preoperative antibiotic prophylaxis. She was transported to the operating room and placed on the operating room table in supine position with the Right upper extremity on an arm board. sedation was induced by the anesthesiologist.  The surgical site was injected with 7 ml half and half plain lidocaine and 0.25% plain marcaine.  The Right upper extremity was prepped and draped in normal sterile orthopedic fashion. A surgical pause was performed between surgeons, anesthesia, and  operating room staff, and all were in agreement as to the patient, procedure, and site of procedure.  Tourniquet at the proximal aspect of the forearm was inflated to 250 mmHg after exsanguination of the forearm with an Esmarch bandage.  An incision was made transversely at the MP flexion crease of the thumb.  This was made through the skin only.  Spreading technique was used.  The radial and ulnar digital nerves were identified and were protected throughout the case. The flexor sheath was identified.  The A1 pulley was identified.  It was sharply incised.  It was released in its entirety.  Care was taken to avoid any release of the oblique pulley. The thumb was placed through a range of motion, there was noted to be no catching.  The wound was copiously irrigated with sterile saline. It was then closed with 4-0 nylon in a horizontal mattress fashion.  The wound was injected with  0.25% plain Marcaine to aid in postoperative analgesia.  It was then dressed with sterile Xeroform, 4x4s, and wrapped lightly with a Coban dressing.  Tourniquet was deflated at 8 minutes.  The fingertips were pink with brisk capillary refill after deflation of the tourniquet.  The operative drapes were broken down and the patient was awoken from anesthesia safely.  She was transferred back to the stretcher and taken to the PACU in stable condition.   I will see her back in the office in 1 week for postoperative followup.  I will give her a prescription for norco 5/325 1-2 tabs po q6 hours prn pain, dispense #20.    Tennis Must, MD Electronically signed, 12/01/17

## 2017-12-01 NOTE — Anesthesia Postprocedure Evaluation (Signed)
Anesthesia Post Note  Patient: Jane [name]  Procedure(s) Performed: RELEASE TRIGGER FINGER/A-1 PULLEY RIGHT THUMB (Right Thumb)     Patient location during evaluation: PACU Anesthesia Type: MAC Level of consciousness: awake and alert Pain management: pain level controlled Vital Signs Assessment: post-procedure vital signs reviewed and stable Respiratory status: spontaneous breathing, nonlabored ventilation, respiratory function stable and patient connected to nasal cannula oxygen Cardiovascular status: stable and blood pressure returned to baseline Postop Assessment: no apparent nausea or vomiting Anesthetic complications: no    Last Vitals:  Vitals:   12/01/17 1545 12/01/17 1602  BP: 125/84 117/80  Pulse: 77 80  Resp: 14 16  Temp:  36.9 C  SpO2: 99% 100%    Last Pain:  Vitals:   12/01/17 1602  TempSrc:   PainSc: 0-No pain                 Mayu Ronk

## 2017-12-01 NOTE — Transfer of Care (Signed)
Immediate Anesthesia Transfer of Care Note  Patient: Jane [name]  Procedure(s) Performed: RELEASE TRIGGER FINGER/A-1 PULLEY RIGHT THUMB (Right Thumb)  Patient Location: PACU  Anesthesia Type:MAC  Level of Consciousness: awake, alert  and oriented  Airway & Oxygen Therapy: Patient Spontanous Breathing and Patient connected to face mask oxygen  Post-op Assessment: Report given to RN and Post -op Vital signs reviewed and stable  Post vital signs: Reviewed and stable  Last Vitals:  Vitals:   12/01/17 1358  BP: 130/84  Pulse: 92  Resp: 18  Temp: 37.1 C  SpO2: 98%    Last Pain:  Vitals:   12/01/17 1358  TempSrc: Oral         Complications: No apparent anesthesia complications

## 2017-12-01 NOTE — H&P (Signed)
Jane [name] is an 52 y.o. female.   Chief Complaint: right trigger thumb HPI: 52 yo female with triggering of right thumb.  This has been injected without lasting resolution.  She wishes to have surgical release.  Allergies:  Allergies  Allergen Reactions  . Abilify [Aripiprazole] Other (See Comments)    Very violent  . Gabapentin Other (See Comments)    Nightmares and talking to her nightmares  . Thorazine [Chlorpromazine] Other (See Comments)    Shakes, anxiety off the roof and panic attack  . Seroquel [Quetiapine Fumarate]     Past Medical History:  Diagnosis Date  . Anxiety   . Bipolar disorder ([place])   . Depression   . DVT (deep venous thrombosis) ([place])   . GERD (gastroesophageal reflux disease)   . Headache(784.0)   . Hypertension   . Hypothyroidism   . Obsessive-compulsive disorder   . Thyroid disease     Past Surgical History:  Procedure Laterality Date  . ABDOMINAL HYSTERECTOMY    . BACK SURGERY    . CHOLECYSTECTOMY    . SPINE SURGERY      Family History: Family History  Problem Relation Age of Onset  . Heart disease Mother   . Hyperlipidemia Mother   . Hypertension Mother   . Bipolar disorder Mother   . Diabetes Father   . Heart disease Father   . Hyperlipidemia Father   . Hypertension Father   . Drug abuse Daughter   . ADD / ADHD Daughter   . Drug abuse Daughter   . Anxiety disorder Daughter   . Bipolar disorder Daughter   . Hypertension Sister   . Hypertension Brother   . Hyperlipidemia Brother   . Heart disease Brother   . Bipolar disorder Maternal Aunt   . Suicidality Maternal Aunt   . Alcohol abuse Neg Hx   . Dementia Neg Hx   . OCD Neg Hx   . Paranoid behavior Neg Hx   . Schizophrenia Neg Hx   . Seizures Neg Hx   . Sexual abuse Neg Hx   . Physical abuse Neg Hx     Social History:   reports that  has never smoked. she has never used smokeless tobacco. She reports that she does not drink alcohol or use  drugs.  Medications: Medications Prior to Admission  Medication Sig Dispense Refill  . ALPRAZolam (XANAX) 1 MG tablet Take 1 tablet (1 mg total) by mouth 2 (two) times daily. 60 tablet 2  . amLODipine (NORVASC) 5 MG tablet Take 5 mg by mouth daily.    Marland Kitchen amphetamine-dextroamphetamine (ADDERALL) 30 MG tablet Take 1 tablet by mouth 2 (two) times daily. 60 tablet 0  . ATORVASTATIN CALCIUM PO Take 20 mg by mouth daily.     . Cholecalciferol (VITAMIN D PO) Take 2,000 mg by mouth daily.    . cyclobenzaprine (FLEXERIL) 10 MG tablet Take 10 mg by mouth daily as needed for muscle spasms.    . cycloSPORINE (RESTASIS) 0.05 % ophthalmic emulsion Place 1 drop into both eyes 2 (two) times daily.    . DULoxetine (CYMBALTA) 60 MG capsule Take 1 capsule (60 mg total) by mouth 2 (two) times daily. 60 capsule 2  . hydrochlorothiazide (HYDRODIURIL) 25 MG tablet Take 25 mg by mouth daily.    Marland Kitchen HYDROcodone-acetaminophen (NORCO) 10-325 MG per tablet Take 1 tablet by mouth 4 (four) times daily as needed.     Marland Kitchen levothyroxine (SYNTHROID, LEVOTHROID) 175 MCG tablet Take 1 tablet (175 mcg  total) by mouth daily before breakfast. (Patient taking differently: Take 137 mcg by mouth daily before breakfast. ) 30 tablet 2  . linaclotide (LINZESS) 145 MCG CAPS capsule Take by mouth.    Marland Kitchen MAGNESIUM CITRATE PO Take 250 mg by mouth daily.    . Melatonin 5 MG TABS Take 10 mg by mouth at bedtime.    . Multiple Vitamins-Minerals (MULTIVITAMIN WITH MINERALS) tablet Take 1 tablet by mouth daily.    . pantoprazole (PROTONIX) 40 MG tablet Take 40 mg by mouth daily.    Marland Kitchen POTASSIUM PO Take 10 mcg 3 (three) times daily by mouth.     Marland Kitchen rOPINIRole (REQUIP) 2 MG tablet Take 2 mg by mouth 2 (two) times daily.    . NON FORMULARY Amberen for Menopause Relief QD    . vitamin C (ASCORBIC ACID) 500 MG tablet Take 1,000 mg by mouth daily.       No results found for this or any previous visit (from the past 48 hour(s)).  No results found.   A  comprehensive review of systems was negative.  Blood pressure 130/84, pulse 92, temperature 98.7 F (37.1 C), temperature source Oral, resp. rate 18, height 5\' 10"  (1.778 m), weight 116.3 kg (256 lb 8 oz), SpO2 98 %.  General appearance: alert, cooperative and appears stated age Head: Normocephalic, without obvious abnormality, atraumatic Neck: supple, symmetrical, trachea midline Resp: clear to auscultation bilaterally Cardio: regular rate and rhythm GI: non-tender Extremities: Intact sensation and capillary refill all digits.  +epl/fpl/io.  No wounds.  Pulses: 2+ and symmetric Skin: Skin color, texture, turgor normal. No rashes or lesions Neurologic: Grossly normal Incision/Wound:none  Assessment/Plan Right trigger thumb.  Non operative and operative treatment options were discussed with the patient and patient wishes to proceed with operative treatment. Risks, benefits, and alternatives of surgery were discussed and the patient agrees with the plan of care.   Dicy Smigel R 12/01/2017, 2:30 PM

## 2017-12-01 NOTE — Anesthesia Preprocedure Evaluation (Addendum)
Anesthesia Evaluation  Patient identified by MRN, date of birth, ID band Patient awake    Reviewed: Allergy & Precautions, NPO status , Patient's Chart, lab work & pertinent test results  Airway Mallampati: III  TM Distance: <3 FB Neck ROM: Full    Dental no notable dental hx. (+) Teeth Intact   Pulmonary neg pulmonary ROS,    Pulmonary exam normal breath sounds clear to auscultation       Cardiovascular hypertension, Pt. on medications negative cardio ROS Normal cardiovascular exam Rhythm:Regular Rate:Normal     Neuro/Psych Anxiety Depression Bipolar Disorder negative neurological ROS  negative psych ROS   GI/Hepatic negative GI ROS, Neg liver ROS, GERD  ,  Endo/Other  negative endocrine ROSHypothyroidism Morbid obesity  Renal/GU negative Renal ROS  negative genitourinary   Musculoskeletal negative musculoskeletal ROS (+)   Abdominal   Peds negative pediatric ROS (+)  Hematology negative hematology ROS (+)   Anesthesia Other Findings   Reproductive/Obstetrics negative OB ROS                             Anesthesia Physical  Anesthesia Plan  ASA: III  Anesthesia Plan: MAC   Post-op Pain Management:    Induction: Intravenous  PONV Risk Score and Plan: 2 and Treatment may vary due to age or medical condition  Airway Management Planned: Mask, Natural Airway and Nasal Cannula  Additional Equipment:   Intra-op Plan:   Post-operative Plan:   Informed Consent: I have reviewed the patients History and Physical, chart, labs and discussed the procedure including the risks, benefits and alternatives for the proposed anesthesia with the patient or authorized representative who has indicated his/her understanding and acceptance.     Plan Discussed with: Anesthesiologist and CRNA  Anesthesia Plan Comments:         Anesthesia Quick Evaluation  

## 2017-12-01 NOTE — Brief Op Note (Signed)
12/01/2017  3:19 PM  PATIENT:  Jane [name]  52 y.o. female  PRE-OPERATIVE DIAGNOSIS:  RIGHT THUMB TRIGGER FINGER  POST-OPERATIVE DIAGNOSIS:  RIGHT THUMB TRIGGER FINGER  PROCEDURE:  Procedure(s): RELEASE TRIGGER FINGER/A-1 PULLEY RIGHT THUMB (Right)  SURGEON:  Surgeon(s) and Role:    * Leanora Cover, MD - Primary  PHYSICIAN ASSISTANT:   ASSISTANTS: none   ANESTHESIA:   local and IV sedation  EBL:  Minimal   BLOOD ADMINISTERED:none  DRAINS: none   LOCAL MEDICATIONS USED:  MARCAINE    and LIDOCAINE   SPECIMEN:  No Specimen  DISPOSITION OF SPECIMEN:  N/A  COUNTS:  YES  TOURNIQUET:   Total Tourniquet Time Documented: Forearm (Right) - 8 minutes Total: Forearm (Right) - 8 minutes   DICTATION: .Note written in EPIC  PLAN OF CARE: Discharge to home after PACU  PATIENT DISPOSITION:  PACU - hemodynamically stable.

## 2017-12-02 ENCOUNTER — Encounter (HOSPITAL_BASED_OUTPATIENT_CLINIC_OR_DEPARTMENT_OTHER): Payer: Self-pay | Admitting: Orthopedic Surgery

## 2017-12-26 ENCOUNTER — Encounter (HOSPITAL_COMMUNITY): Payer: Self-pay | Admitting: Psychiatry

## 2017-12-26 ENCOUNTER — Ambulatory Visit (INDEPENDENT_AMBULATORY_CARE_PROVIDER_SITE_OTHER): Payer: Medicaid Other | Admitting: Psychiatry

## 2017-12-26 VITALS — BP 119/80 | HR 89 | Ht 70.0 in | Wt 248.0 lb

## 2017-12-26 DIAGNOSIS — Z813 Family history of other psychoactive substance abuse and dependence: Secondary | ICD-10-CM | POA: Diagnosis not present

## 2017-12-26 DIAGNOSIS — Z630 Problems in relationship with spouse or partner: Secondary | ICD-10-CM | POA: Diagnosis not present

## 2017-12-26 DIAGNOSIS — R450 Nervousness: Secondary | ICD-10-CM

## 2017-12-26 DIAGNOSIS — F419 Anxiety disorder, unspecified: Secondary | ICD-10-CM

## 2017-12-26 DIAGNOSIS — Z736 Limitation of activities due to disability: Secondary | ICD-10-CM

## 2017-12-26 DIAGNOSIS — F3130 Bipolar disorder, current episode depressed, mild or moderate severity, unspecified: Secondary | ICD-10-CM | POA: Diagnosis not present

## 2017-12-26 DIAGNOSIS — Z818 Family history of other mental and behavioral disorders: Secondary | ICD-10-CM

## 2017-12-26 MED ORDER — DULOXETINE HCL 60 MG PO CPEP: 60.0000 mg | ORAL_CAPSULE | Freq: Two times a day (BID) | ORAL | 2 refills | Status: DC

## 2017-12-26 MED ORDER — ALPRAZOLAM 1 MG PO TABS: 1.0000 mg | ORAL_TABLET | Freq: Two times a day (BID) | ORAL | 2 refills | Status: DC

## 2017-12-26 NOTE — Progress Notes (Signed)
[name] OP Progress Note  12/26/2017 10:43 AM Jane [name]  MRN:  [id number]  Chief Complaint:  Chief Complaint    Depression; Anxiety; Follow-up     HPI: This patient is a 52year-old divorced white female who lives alone in [place]. She is on disability due to bipolar disorder.  The patient states that she became very depressed when her mother died in 02-20-2000 of a brain aneurysm. She started getting. Sad but eventually became manic and develop mood swings and agitated behavior. She's been on numerous medications over the years. In 1988/02/20 she also had postpartum depression. For many years she was on lithium but it caused a bad taste in her mouth and her doctor was worried it might be starting to affect her renal function so was discontinued. She also thinks that it stopped working after a number of years  Patient returns after 4 weeks.  Last time she stated that she had broken up with her boyfriend.  Apparently he begged her to get back together and brought her a bunch of gas for Valentine's Day.  However she just found out earlier this week that he was actually seeing another woman and this is been going on for about 10 years.  She has been seeing him for 18 years.  She obviously now does not want anything more to do with him.  She states right now she is angry with him and she has been getting up and going to the gym and trying to eat healthy and she has lost about 12 pounds.  She feels good about the current situation and realizes that being with him was a waste of time.  She states that her mood is good and her anxiety is under good control.  She is seeing a therapist here which is also helpful Visit Diagnosis:    ICD-10-CM   1. Bipolar I disorder, most recent episode depressed ([place]) F31.30     Past Psychiatric History: Long-term outpatient therapy for bipolar disorder  Past Medical History:  Past Medical History:  Diagnosis Date  . Anxiety   . Bipolar disorder ([facility])   . Depression    . DVT (deep venous thrombosis) ([facility])   . GERD (gastroesophageal reflux disease)   . Headache(784.0)   . Hypertension   . Hypothyroidism   . Obsessive-compulsive disorder   . Thyroid disease     Past Surgical History:  Procedure Laterality Date  . ABDOMINAL HYSTERECTOMY    . BACK SURGERY    . CHOLECYSTECTOMY    . SPINE SURGERY    . TRIGGER FINGER RELEASE Right 12/01/2017   Procedure: RELEASE TRIGGER FINGER/A-1 PULLEY RIGHT THUMB;  Surgeon: Leanora Cover, MD;  Location: [facility];  Service: Orthopedics;  Laterality: Right;    Family Psychiatric History: See below  Family History:  Family History  Problem Relation Age of Onset  . Heart disease Mother   . Hyperlipidemia Mother   . Hypertension Mother   . Bipolar disorder Mother   . Diabetes Father   . Heart disease Father   . Hyperlipidemia Father   . Hypertension Father   . Drug abuse Daughter   . ADD / ADHD Daughter   . Drug abuse Daughter   . Anxiety disorder Daughter   . Bipolar disorder Daughter   . Hypertension Sister   . Hypertension Brother   . Hyperlipidemia Brother   . Heart disease Brother   . Bipolar disorder Maternal Aunt   . Suicidality Maternal Aunt   .  Alcohol abuse Neg Hx   . Dementia Neg Hx   . OCD Neg Hx   . Paranoid behavior Neg Hx   . Schizophrenia Neg Hx   . Seizures Neg Hx   . Sexual abuse Neg Hx   . Physical abuse Neg Hx     Social History:  Social History   Socioeconomic History  . Marital status: Divorced    Spouse name: None  . Number of children: None  . Years of education: None  . Highest education level: None  Social Needs  . Financial resource strain: None  . Food insecurity - worry: None  . Food insecurity - inability: None  . Transportation needs - medical: None  . Transportation needs - non-medical: None  Occupational History  . None  Tobacco Use  . Smoking status: Never Smoker  . Smokeless tobacco: Never Used  Substance and Sexual Activity  .  Alcohol use: No  . Drug use: No  . Sexual activity: Yes    Partners: Male    Birth control/protection: Surgical  Other Topics Concern  . None  Social History Narrative  . None    Allergies:  Allergies  Allergen Reactions  . Abilify [Aripiprazole] Other (See Comments)    Very violent  . Gabapentin Other (See Comments)    Nightmares and talking to her nightmares  . Thorazine [Chlorpromazine] Other (See Comments)    Shakes, anxiety off the roof and panic attack  . Seroquel [Quetiapine Fumarate]     Metabolic Disorder Labs: No results found for: HGBA1C, MPG No results found for: PROLACTIN No results found for: CHOL, TRIG, HDL, CHOLHDL, VLDL, LDLCALC No results found for: TSH  Therapeutic Level Labs: No results found for: LITHIUM Lab Results  Component Value Date   VALPROATE 61.2 03/23/2014   No components found for:  CBMZ  Current Medications: Current Outpatient Medications  Medication Sig Dispense Refill  . ALPRAZolam (XANAX) 1 MG tablet Take 1 tablet (1 mg total) by mouth 2 (two) times daily. 60 tablet 2  . amLODipine (NORVASC) 5 MG tablet Take 5 mg by mouth daily.    Marland Kitchen amphetamine-dextroamphetamine (ADDERALL) 30 MG tablet Take 1 tablet by mouth 2 (two) times daily. 60 tablet 0  . ATORVASTATIN CALCIUM PO Take 20 mg by mouth daily.     . Cholecalciferol (VITAMIN D PO) Take 2,000 mg by mouth daily.    . cyclobenzaprine (FLEXERIL) 10 MG tablet Take 10 mg by mouth daily as needed for muscle spasms.    . cycloSPORINE (RESTASIS) 0.05 % ophthalmic emulsion Place 1 drop into both eyes 2 (two) times daily.    . DULoxetine (CYMBALTA) 60 MG capsule Take 1 capsule (60 mg total) by mouth 2 (two) times daily. 60 capsule 2  . hydrochlorothiazide (HYDRODIURIL) 25 MG tablet Take 25 mg by mouth daily.    Marland Kitchen HYDROcodone-acetaminophen (NORCO) 10-325 MG per tablet Take 1 tablet by mouth 4 (four) times daily as needed.     Marland Kitchen HYDROcodone-acetaminophen (NORCO) 5-325 MG tablet 1-2 tabs po q6  hours prn pain 20 tablet 0  . levothyroxine (SYNTHROID, LEVOTHROID) 175 MCG tablet Take 1 tablet (175 mcg total) by mouth daily before breakfast. (Patient taking differently: Take 137 mcg by mouth daily before breakfast. ) 30 tablet 2  . linaclotide (LINZESS) 145 MCG CAPS capsule Take by mouth.    Marland Kitchen MAGNESIUM CITRATE PO Take 250 mg by mouth daily.    . Melatonin 5 MG TABS Take 10 mg by mouth at bedtime.    Marland Kitchen  Multiple Vitamins-Minerals (MULTIVITAMIN WITH MINERALS) tablet Take 1 tablet by mouth daily.    . NON FORMULARY Amberen for Menopause Relief QD    . pantoprazole (PROTONIX) 40 MG tablet Take 40 mg by mouth daily.    Marland Kitchen POTASSIUM PO Take 10 mcg 3 (three) times daily by mouth.     Marland Kitchen rOPINIRole (REQUIP) 2 MG tablet Take 2 mg by mouth 2 (two) times daily.    . vitamin C (ASCORBIC ACID) 500 MG tablet Take 1,000 mg by mouth daily.      No current facility-administered medications for this visit.      Musculoskeletal: Strength & Muscle Tone: within normal limits Gait & Station: normal Patient leans: N/A  Psychiatric Specialty Exam: Review of Systems  Psychiatric/Behavioral: The patient is nervous/anxious.   All other systems reviewed and are negative.   Blood pressure 119/80, pulse 89, height 5\' 10"  (1.778 m), weight 248 lb (112.5 kg), SpO2 98 %.Body mass index is 35.58 kg/m.  General Appearance: Casual and Fairly Groomed  Eye Contact:  Good  Speech:  Clear and Coherent  Volume:  Normal  Mood:  Anxious  Affect:  Congruent  Thought Process:  Goal Directed  Orientation:  Full (Time, Place, and Person)  Thought Content: Rumination   Suicidal Thoughts:  No  Homicidal Thoughts:  No  Memory:  Immediate;   Good Recent;   Good Remote;   Good  Judgement:  Fair  Insight:  Fair  Psychomotor Activity:  Restlessness  Concentration:  Concentration: Fair and Attention Span: Fair  Recall:  Good  Fund of Knowledge: Good  Language: Good  Akathisia:  No  Handed:  Right  AIMS (if  indicated): not done  Assets:  Communication Skills Desire for Improvement Physical Health Resilience Social Support Talents/Skills  ADL's:  Intact  Cognition: WNL  Sleep:  Good   Screenings: PHQ2-9     Nutrition from 09/22/2014 in [facility]  PHQ-2 Total Score  4  PHQ-9 Total Score  15       Assessment and Plan: Patient is a 52 year old female with a history of bipolar disorder.  She is just found out some very bad news about her boyfriend being unfaithful.  For the moment she is dealing with it well.  However we will keep a close eye on her.  She will continue Cymbalta 60 mg twice a day for depression, Xanax 1 mg twice a day for anxiety.  She gets Adderall 30 mg twice a day from her primary doctor.  She will return to see me in 4 weeks   [name], MD 12/26/2017, 10:43 AM

## 2017-12-29 ENCOUNTER — Ambulatory Visit (HOSPITAL_COMMUNITY): Payer: Medicaid Other | Admitting: Licensed Clinical Social Worker

## 2018-01-02 DIAGNOSIS — M65312 Trigger thumb, left thumb: Secondary | ICD-10-CM

## 2018-01-02 HISTORY — DX: Trigger thumb, left thumb: M65.312

## 2018-01-12 ENCOUNTER — Ambulatory Visit (HOSPITAL_COMMUNITY): Payer: Self-pay | Admitting: Psychiatry

## 2018-01-13 ENCOUNTER — Other Ambulatory Visit: Payer: Self-pay | Admitting: Orthopedic Surgery

## 2018-01-20 ENCOUNTER — Other Ambulatory Visit: Payer: Self-pay

## 2018-01-20 ENCOUNTER — Encounter (HOSPITAL_BASED_OUTPATIENT_CLINIC_OR_DEPARTMENT_OTHER): Payer: Self-pay | Admitting: *Deleted

## 2018-01-20 NOTE — Progress Notes (Signed)
   01/20/18 1108  OBSTRUCTIVE SLEEP APNEA  Have you ever been diagnosed with sleep apnea through a sleep study? No  Do you snore loudly (loud enough to be heard through closed doors)?  1  Do you often feel tired, fatigued, or sleepy during the daytime (such as falling asleep during driving or talking to someone)? 0  Has anyone observed you stop breathing during your sleep? 0  Do you have, or are you being treated for high blood pressure? 1  BMI more than 35 kg/m2? 1  Age > 70 (1-yes) 1  Female Gender (Yes=1) 0  Obstructive Sleep Apnea Score 4  Score 5 or greater  Results sent to PCP (Dr. Sinda Du)

## 2018-01-20 NOTE — Pre-Procedure Instructions (Signed)
To go to [facility] for BMET 

## 2018-01-23 ENCOUNTER — Encounter (HOSPITAL_COMMUNITY)
Admission: RE | Admit: 2018-01-23 | Discharge: 2018-01-23 | Disposition: A | Discharge status: Home / Self Care | Payer: Medicaid Other | Source: Ambulatory Visit | Attending: Orthopedic Surgery | Admitting: Orthopedic Surgery

## 2018-01-23 ENCOUNTER — Ambulatory Visit (INDEPENDENT_AMBULATORY_CARE_PROVIDER_SITE_OTHER): Payer: Medicaid Other | Admitting: Psychiatry

## 2018-01-23 ENCOUNTER — Encounter (HOSPITAL_COMMUNITY): Payer: Self-pay | Admitting: Psychiatry

## 2018-01-23 VITALS — BP 145/80 | HR 90 | Ht 70.0 in | Wt 250.0 lb

## 2018-01-23 DIAGNOSIS — G4700 Insomnia, unspecified: Secondary | ICD-10-CM | POA: Diagnosis not present

## 2018-01-23 DIAGNOSIS — Z813 Family history of other psychoactive substance abuse and dependence: Secondary | ICD-10-CM

## 2018-01-23 DIAGNOSIS — Z01818 Encounter for other preprocedural examination: Secondary | ICD-10-CM | POA: Insufficient documentation

## 2018-01-23 DIAGNOSIS — F3130 Bipolar disorder, current episode depressed, mild or moderate severity, unspecified: Secondary | ICD-10-CM

## 2018-01-23 DIAGNOSIS — M2550 Pain in unspecified joint: Secondary | ICD-10-CM | POA: Diagnosis not present

## 2018-01-23 DIAGNOSIS — M549 Dorsalgia, unspecified: Secondary | ICD-10-CM

## 2018-01-23 DIAGNOSIS — Z818 Family history of other mental and behavioral disorders: Secondary | ICD-10-CM

## 2018-01-23 LAB — BASIC METABOLIC PANEL
ANION GAP: 13 (ref 5–15)
BUN: 31 mg/dL — AB (ref 6–20)
CALCIUM: 9.3 mg/dL (ref 8.9–10.3)
CO2: 30 mmol/L (ref 22–32)
Chloride: 95 mmol/L — ABNORMAL LOW (ref 101–111)
Creatinine, Ser: 1.29 mg/dL — ABNORMAL HIGH (ref 0.44–1.00)
GFR calc Af Amer: 55 mL/min — ABNORMAL LOW (ref >60)
GFR, EST NON AFRICAN AMERICAN: 47 mL/min — AB (ref >60)
GLUCOSE: 134 mg/dL — AB (ref 65–99)
Potassium: 3.2 mmol/L — ABNORMAL LOW (ref 3.5–5.1)
SODIUM: 138 mmol/L (ref 135–145)

## 2018-01-23 MED ORDER — ZOLPIDEM TARTRATE 10 MG PO TABS: 10.0000 mg | ORAL_TABLET | Freq: Every evening | ORAL | 2 refills | Status: DC | PRN

## 2018-01-23 MED ORDER — DULOXETINE HCL 60 MG PO CPEP: 60.0000 mg | ORAL_CAPSULE | Freq: Two times a day (BID) | ORAL | 2 refills | Status: DC

## 2018-01-23 MED ORDER — ALPRAZOLAM 1 MG PO TABS: 1.0000 mg | ORAL_TABLET | Freq: Two times a day (BID) | ORAL | 2 refills | Status: DC
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
## 2018-01-23 NOTE — Progress Notes (Signed)
Lab results reviewed by Dr. Conrad [place], will proceed with surgery as scheduled.

## 2018-01-26 ENCOUNTER — Ambulatory Visit (HOSPITAL_BASED_OUTPATIENT_CLINIC_OR_DEPARTMENT_OTHER)
Admission: RE | Admit: 2018-01-26 | Discharge: 2018-01-26 | Disposition: A | Discharge status: Home / Self Care | Payer: Medicaid Other | Source: Ambulatory Visit | Attending: Orthopedic Surgery | Admitting: Orthopedic Surgery

## 2018-01-26 ENCOUNTER — Encounter (HOSPITAL_BASED_OUTPATIENT_CLINIC_OR_DEPARTMENT_OTHER)
Admission: RE | Disposition: A | Discharge status: Home / Self Care | Payer: Self-pay | Source: Ambulatory Visit | Attending: Orthopedic Surgery

## 2018-01-26 ENCOUNTER — Encounter (HOSPITAL_BASED_OUTPATIENT_CLINIC_OR_DEPARTMENT_OTHER): Payer: Self-pay | Admitting: *Deleted

## 2018-01-26 ENCOUNTER — Ambulatory Visit (HOSPITAL_BASED_OUTPATIENT_CLINIC_OR_DEPARTMENT_OTHER): Payer: Medicaid Other | Admitting: Anesthesiology

## 2018-01-26 ENCOUNTER — Other Ambulatory Visit: Payer: Self-pay

## 2018-01-26 DIAGNOSIS — Z6835 Body mass index (BMI) 35.0-35.9, adult: Secondary | ICD-10-CM | POA: Diagnosis not present

## 2018-01-26 DIAGNOSIS — Z90710 Acquired absence of both cervix and uterus: Secondary | ICD-10-CM | POA: Insufficient documentation

## 2018-01-26 DIAGNOSIS — E7800 Pure hypercholesterolemia, unspecified: Secondary | ICD-10-CM | POA: Diagnosis not present

## 2018-01-26 DIAGNOSIS — Z888 Allergy status to other drugs, medicaments and biological substances status: Secondary | ICD-10-CM | POA: Diagnosis not present

## 2018-01-26 DIAGNOSIS — F429 Obsessive-compulsive disorder, unspecified: Secondary | ICD-10-CM | POA: Insufficient documentation

## 2018-01-26 DIAGNOSIS — F319 Bipolar disorder, unspecified: Secondary | ICD-10-CM | POA: Insufficient documentation

## 2018-01-26 DIAGNOSIS — Z79899 Other long term (current) drug therapy: Secondary | ICD-10-CM | POA: Diagnosis not present

## 2018-01-26 DIAGNOSIS — Z86718 Personal history of other venous thrombosis and embolism: Secondary | ICD-10-CM | POA: Insufficient documentation

## 2018-01-26 DIAGNOSIS — K219 Gastro-esophageal reflux disease without esophagitis: Secondary | ICD-10-CM | POA: Insufficient documentation

## 2018-01-26 DIAGNOSIS — F419 Anxiety disorder, unspecified: Secondary | ICD-10-CM | POA: Diagnosis not present

## 2018-01-26 DIAGNOSIS — M65312 Trigger thumb, left thumb: Secondary | ICD-10-CM | POA: Insufficient documentation

## 2018-01-26 DIAGNOSIS — E039 Hypothyroidism, unspecified: Secondary | ICD-10-CM | POA: Insufficient documentation

## 2018-01-26 DIAGNOSIS — G2581 Restless legs syndrome: Secondary | ICD-10-CM | POA: Diagnosis not present

## 2018-01-26 DIAGNOSIS — Z8249 Family history of ischemic heart disease and other diseases of the circulatory system: Secondary | ICD-10-CM | POA: Insufficient documentation

## 2018-01-26 DIAGNOSIS — I10 Essential (primary) hypertension: Secondary | ICD-10-CM | POA: Diagnosis not present

## 2018-01-26 DIAGNOSIS — Z981 Arthrodesis status: Secondary | ICD-10-CM | POA: Insufficient documentation

## 2018-01-26 HISTORY — DX: Pure hypercholesterolemia, unspecified: E78.00

## 2018-01-26 HISTORY — PX: TRIGGER FINGER RELEASE: SHX641

## 2018-01-26 HISTORY — DX: Personal history of other venous thrombosis and embolism: Z86.718

## 2018-01-26 HISTORY — DX: Trigger thumb, left thumb: M65.312

## 2018-01-26 HISTORY — DX: Restless legs syndrome: G25.81

## 2018-01-26 SURGERY — RELEASE, A1 PULLEY, FOR TRIGGER FINGER
Anesthesia: Monitor Anesthesia Care | Site: Thumb | Laterality: Left

## 2018-01-26 MED ORDER — HYDROCODONE-ACETAMINOPHEN 5-325 MG PO TABS: ORAL_TABLET | ORAL | 0 refills | Status: DC

## 2018-01-26 MED ORDER — PROPOFOL 500 MG/50ML IV EMUL
INTRAVENOUS | Status: DC | PRN
  Administered 2018-01-26: 50 ug/kg/min via INTRAVENOUS

## 2018-01-26 MED ORDER — SCOPOLAMINE 1 MG/3DAYS TD PT72: 1.0000 | MEDICATED_PATCH | Freq: Once | TRANSDERMAL | Status: DC | PRN

## 2018-01-26 MED ORDER — LACTATED RINGERS IV SOLN
INTRAVENOUS | Status: DC
  Administered 2018-01-26: 13:00:00 via INTRAVENOUS

## 2018-01-26 MED ORDER — MIDAZOLAM HCL 2 MG/2ML IJ SOLN
1.0000 mg | INTRAMUSCULAR | Status: DC | PRN
  Administered 2018-01-26: 2 mg via INTRAVENOUS

## 2018-01-26 MED ORDER — CHLORHEXIDINE GLUCONATE 4 % EX LIQD: 60.0000 mL | Freq: Once | CUTANEOUS | Status: DC

## 2018-01-26 MED ORDER — LIDOCAINE HCL (PF) 0.5 % IJ SOLN
INTRAMUSCULAR | Status: DC | PRN
  Administered 2018-01-26: 40 mL via INTRAVENOUS

## 2018-01-26 MED ORDER — METOCLOPRAMIDE HCL 5 MG/ML IJ SOLN: 10.0000 mg | Freq: Once | INTRAMUSCULAR | Status: DC | PRN

## 2018-01-26 MED ORDER — PROPOFOL 500 MG/50ML IV EMUL
INTRAVENOUS | Status: AC
  Filled 2018-01-26: qty 50

## 2018-01-26 MED ORDER — MEPERIDINE HCL 25 MG/ML IJ SOLN: 6.2500 mg | INTRAMUSCULAR | Status: DC | PRN

## 2018-01-26 MED ORDER — FENTANYL CITRATE (PF) 100 MCG/2ML IJ SOLN
INTRAMUSCULAR | Status: AC
  Filled 2018-01-26: qty 2

## 2018-01-26 MED ORDER — LACTATED RINGERS IV SOLN: INTRAVENOUS | Status: DC

## 2018-01-26 MED ORDER — ONDANSETRON HCL 4 MG/2ML IJ SOLN
INTRAMUSCULAR | Status: DC | PRN
  Administered 2018-01-26: 4 mg via INTRAVENOUS

## 2018-01-26 MED ORDER — MIDAZOLAM HCL 2 MG/2ML IJ SOLN
INTRAMUSCULAR | Status: AC
  Filled 2018-01-26: qty 2

## 2018-01-26 MED ORDER — CEFAZOLIN SODIUM-DEXTROSE 2-4 GM/100ML-% IV SOLN
2.0000 g | INTRAVENOUS | Status: AC
  Administered 2018-01-26: 2 g via INTRAVENOUS

## 2018-01-26 MED ORDER — CEFAZOLIN SODIUM-DEXTROSE 2-4 GM/100ML-% IV SOLN
INTRAVENOUS | Status: AC
  Filled 2018-01-26: qty 100

## 2018-01-26 MED ORDER — ONDANSETRON HCL 4 MG/2ML IJ SOLN
INTRAMUSCULAR | Status: AC
  Filled 2018-01-26: qty 2

## 2018-01-26 MED ORDER — FENTANYL CITRATE (PF) 100 MCG/2ML IJ SOLN
50.0000 ug | INTRAMUSCULAR | Status: DC | PRN
  Administered 2018-01-26: 50 ug via INTRAVENOUS

## 2018-01-26 MED ORDER — FENTANYL CITRATE (PF) 100 MCG/2ML IJ SOLN: 25.0000 ug | INTRAMUSCULAR | Status: DC | PRN

## 2018-01-26 SURGICAL SUPPLY — 32 items
BANDAGE COBAN STERILE 2 (GAUZE/BANDAGES/DRESSINGS) ×2 IMPLANT
BLADE SURG 15 STRL LF DISP TIS (BLADE) ×2 IMPLANT
BLADE SURG 15 STRL SS (BLADE) ×4
BNDG CMPR 9X4 STRL LF SNTH (GAUZE/BANDAGES/DRESSINGS)
BNDG ESMARK 4X9 LF (GAUZE/BANDAGES/DRESSINGS) IMPLANT
CHLORAPREP W/TINT 26ML (MISCELLANEOUS) ×2 IMPLANT
CORD BIPOLAR FORCEPS 12FT (ELECTRODE) ×2 IMPLANT
COVER BACK TABLE 60X90IN (DRAPES) ×2 IMPLANT
COVER MAYO STAND STRL (DRAPES) ×2 IMPLANT
CUFF TOURNIQUET SINGLE 18IN (TOURNIQUET CUFF) ×2 IMPLANT
DRAPE EXTREMITY T 121X128X90 (DRAPE) ×2 IMPLANT
DRAPE SURG 17X23 STRL (DRAPES) ×2 IMPLANT
GAUZE SPONGE 4X4 12PLY STRL (GAUZE/BANDAGES/DRESSINGS) ×2 IMPLANT
GAUZE XEROFORM 1X8 LF (GAUZE/BANDAGES/DRESSINGS) ×2 IMPLANT
GLOVE BIO SURGEON STRL SZ7 (GLOVE) ×2 IMPLANT
GLOVE BIO SURGEON STRL SZ7.5 (GLOVE) ×2 IMPLANT
GLOVE BIOGEL PI IND STRL 7.5 (GLOVE) ×1 IMPLANT
GLOVE BIOGEL PI IND STRL 8 (GLOVE) ×2 IMPLANT
GLOVE BIOGEL PI INDICATOR 7.5 (GLOVE) ×1
GLOVE BIOGEL PI INDICATOR 8 (GLOVE) ×2
GOWN STRL REUS W/ TWL LRG LVL3 (GOWN DISPOSABLE) ×1 IMPLANT
GOWN STRL REUS W/TWL LRG LVL3 (GOWN DISPOSABLE) ×2
GOWN STRL REUS W/TWL XL LVL3 (GOWN DISPOSABLE) ×2 IMPLANT
NEEDLE HYPO 25X1 1.5 SAFETY (NEEDLE) ×2 IMPLANT
NS IRRIG 1000ML POUR BTL (IV SOLUTION) ×2 IMPLANT
PACK BASIN DAY SURGERY FS (CUSTOM PROCEDURE TRAY) ×2 IMPLANT
STOCKINETTE 4X48 STRL (DRAPES) ×2 IMPLANT
SUT ETHILON 4 0 PS 2 18 (SUTURE) ×2 IMPLANT
SYR BULB 3OZ (MISCELLANEOUS) ×2 IMPLANT
SYR CONTROL 10ML LL (SYRINGE) ×2 IMPLANT
TOWEL OR 17X24 6PK STRL BLUE (TOWEL DISPOSABLE) ×4 IMPLANT
UNDERPAD 30X30 (UNDERPADS AND DIAPERS) ×2 IMPLANT

## 2018-01-26 NOTE — Anesthesia Procedure Notes (Signed)
Anesthesia Regional Block: Bier block (IV Regional)   Pre-Anesthetic Checklist: ,, timeout performed, Correct Patient, Correct Site, Correct Laterality, Correct Procedure, Correct Position, site marked, Risks and benefits discussed,  Surgical consent,  Pre-op evaluation,  At surgeon's request and post-op pain management   Prep: alcohol swabs        Procedures:,,,,,,,, #20gu IV placed  Narrative:  CRNA: Verita Lamb, CRNA

## 2018-01-26 NOTE — Discharge Instructions (Addendum)

## 2018-01-26 NOTE — Transfer of Care (Signed)
Immediate Anesthesia Transfer of Care Note  Patient: Jane [name]  Procedure(s) Performed: LEFT TRIGGER THUMB RELEASE (Left Thumb)  Patient Location: PACU  Anesthesia Type:MAC and Bier block  Level of Consciousness: awake, alert  and oriented  Airway & Oxygen Therapy: Patient Spontanous Breathing  Post-op Assessment: Report given to RN and Post -op Vital signs reviewed and stable  Post vital signs: Reviewed and stable  Last Vitals:  Vitals Value Taken Time  BP    Temp    Pulse 79 01/26/2018  3:18 PM  Resp 13 01/26/2018  3:18 PM  SpO2 99 % 01/26/2018  3:18 PM  Vitals shown include unvalidated device data.  Last Pain:  Vitals:   01/26/18 1232  TempSrc: Oral  PainSc: 0-No pain         Complications: No apparent anesthesia complications

## 2018-01-26 NOTE — Anesthesia Preprocedure Evaluation (Signed)
Anesthesia Evaluation  Patient identified by MRN, date of birth, ID band Patient awake    Reviewed: Allergy & Precautions, NPO status , Patient's Chart, lab work & pertinent test results  Airway Mallampati: III  TM Distance: <3 FB Neck ROM: Full    Dental no notable dental hx. (+) Teeth Intact   Pulmonary neg pulmonary ROS,    Pulmonary exam normal breath sounds clear to auscultation       Cardiovascular hypertension, Pt. on medications negative cardio ROS Normal cardiovascular exam Rhythm:Regular Rate:Normal     Neuro/Psych Anxiety Depression Bipolar Disorder negative neurological ROS  negative psych ROS   GI/Hepatic negative GI ROS, Neg liver ROS, GERD  ,  Endo/Other  negative endocrine ROSHypothyroidism Morbid obesity  Renal/GU negative Renal ROS  negative genitourinary   Musculoskeletal negative musculoskeletal ROS (+)   Abdominal   Peds negative pediatric ROS (+)  Hematology negative hematology ROS (+)   Anesthesia Other Findings   Reproductive/Obstetrics negative OB ROS                             Anesthesia Physical  Anesthesia Plan  ASA: III  Anesthesia Plan: MAC   Post-op Pain Management:    Induction: Intravenous  PONV Risk Score and Plan: 2 and Treatment may vary due to age or medical condition  Airway Management Planned: Mask, Natural Airway and Nasal Cannula  Additional Equipment:   Intra-op Plan:   Post-operative Plan:   Informed Consent: I have reviewed the patients History and Physical, chart, labs and discussed the procedure including the risks, benefits and alternatives for the proposed anesthesia with the patient or authorized representative who has indicated his/her understanding and acceptance.     Plan Discussed with: Anesthesiologist and CRNA  Anesthesia Plan Comments:         Anesthesia Quick Evaluation

## 2018-01-26 NOTE — Op Note (Signed)
01/26/2018 [facility] OPERATIVE REPORT   PREOPERATIVE DIAGNOSIS: Left trigger thumb.  POSTOPERATIVE DIAGNOSIS:  Left trigger thumb.  PROCEDURE: Left trigger thumb release.  SURGEON:  Leanora Cover, MD  ASSISTANT:  None.  ANESTHESIA:  Bier block with sedation.  IV FLUIDS:  Per anesthesia flow sheet.  ESTIMATED BLOOD LOSS:  Minimal.  COMPLICATIONS:  None.  SPECIMENS:  None.  TOURNIQUET TIME:  Total Tourniquet Time Documented: Forearm (Left) - 15 minutes Total: Forearm (Left) - 15 minutes   DISPOSITION:  Stable to PACU.  LOCATION: [facility]  INDICATIONS: Jane [name] is a 52 y.o. female with triggering left thumb.  She wishes to have a trigger release.  Risks, benefits and alternatives of surgery were discussed including the risk of blood loss, infection, damage to nerves, vessels, tendons, ligaments, bone, failure of surgery, need for additional surgery, complications with wound healing, continued pain, continued triggering and need for repeat surgery.  She voiced understanding of these risks and elected to proceed.  OPERATIVE COURSE:  After being identified preoperatively by myself, the patient and I agreed upon the procedure and site of procedure.  The surgical site was marked. The risks, benefits, and alternatives of surgery were reviewed and she wished to proceed.  Surgical consent had been signed. She was given IV Ancef as preoperative antibiotic prophylaxis. She was transported to the operating room and placed on the operating room table in supine position with the Left upper extremity on an arm board. Bier block and sedation was induced by the anesthesiologist.  The Left upper extremity was prepped and draped in normal sterile orthopedic fashion. A surgical pause was performed between surgeons, anesthesia, and operating room staff, and all were in agreement as to the patient, procedure, and site of procedure.  Tourniquet at the proximal aspect  of the forearm had been inflated for the Bier block.   An incision was made transversely at the MP flexion crease of the thumb.  This was made through the skin only.  Spreading technique was used.  The radial and ulnar digital nerves were identified and were protected throughout the case. The flexor sheath was identified.  The A1 pulley was identified.  It was sharply incised.  It was released in its entirety.  Care was taken to avoid any release of the oblique pulley. The thumb was placed through a range of motion, there was noted to be no catching.  The wound was copiously irrigated with sterile saline. It was then closed with 4-0 nylon in a horizontal mattress fashion.  The wound was injected with  0.25% plain Marcaine to aid in postoperative analgesia.  It was then dressed with sterile Xeroform, 4x4s, and wrapped lightly with a Coban dressing.  Tourniquet was deflated at 15 minutes.  The fingertips were pink with brisk capillary refill after deflation of the tourniquet.  The operative drapes were broken down and the patient was awoken from anesthesia safely.  She was transferred back to the stretcher and taken to the PACU in stable condition.   I will see her back in the office in 1 week for postoperative followup.  I will give her a prescription for norco 5/325 1-2 tabs po q6 hours prn pain, dispense #20.    Tennis Must, MD Electronically signed, 01/26/18

## 2018-01-26 NOTE — Brief Op Note (Signed)
01/26/2018  3:15 PM  PATIENT:  Jane [name]  52 y.o. female  PRE-OPERATIVE DIAGNOSIS:  LEFT TRIGGER THUMB  POST-OPERATIVE DIAGNOSIS:  LEFT TRIGGER THUMB  PROCEDURE:  Procedure(s): LEFT TRIGGER THUMB RELEASE (Left)  SURGEON:  Surgeon(s) and Role:    * Leanora Cover, MD - Primary  PHYSICIAN ASSISTANT:   ASSISTANTS: none   ANESTHESIA:   Bier block with sedation  EBL:  Minimal  BLOOD ADMINISTERED:none  DRAINS: none   LOCAL MEDICATIONS USED:  MARCAINE     SPECIMEN:  No Specimen  DISPOSITION OF SPECIMEN:  N/A  COUNTS:  YES  TOURNIQUET:   Total Tourniquet Time Documented: Forearm (Left) - 15 minutes Total: Forearm (Left) - 15 minutes   DICTATION: .Note written in EPIC  PLAN OF CARE: Discharge to home after PACU  PATIENT DISPOSITION:  PACU - hemodynamically stable.

## 2018-01-26 NOTE — H&P (Signed)
Jane [name] is an 52 y.o. female.   Chief Complaint: left thumb trigger digit HPI: 52 yo female with triggering left thumb.  She wishes to undergo surgical release.    Allergies:  Allergies  Allergen Reactions  . Abilify [Aripiprazole] Other (See Comments)    BECOMES VIOLENT  . Gabapentin Other (See Comments)    NIGHTMARES  . Seroquel [Quetiapine Fumarate] Other (See Comments)    BECOMES VIOLENT  . Thorazine [Chlorpromazine] Other (See Comments)    SEVERE ANXIETY    Past Medical History:  Diagnosis Date  . Anxiety   . Bipolar disorder ([place])   . Depression   . GERD (gastroesophageal reflux disease)   . High cholesterol   . History of DVT (deep vein thrombosis)    left leg  . Hypertension    states under control with meds., has been on med. x 2 years  . Hypothyroidism   . Obsessive-compulsive disorder   . Restless leg syndrome   . Trigger thumb of left hand 01/2018    Past Surgical History:  Procedure Laterality Date  . ABDOMINAL HYSTERECTOMY  06/2016   complete  . CHOLECYSTECTOMY    . LUMBAR FUSION  11/21/2000   L5-S1  . LUMBAR SPINE SURGERY     x 2 others  . TRIGGER FINGER RELEASE Right 12/01/2017   Procedure: RELEASE TRIGGER FINGER/A-1 PULLEY RIGHT THUMB;  Surgeon: Jane Cover, MD;  Location: [facility];  Service: Orthopedics;  Laterality: Right;    Family History: Family History  Problem Relation Age of Onset  . Heart disease Mother   . Hyperlipidemia Mother   . Hypertension Mother   . Bipolar disorder Mother   . Diabetes Father   . Heart disease Father   . Hyperlipidemia Father   . Hypertension Father   . Drug abuse Daughter   . ADD / ADHD Daughter   . Drug abuse Daughter   . Anxiety disorder Daughter   . Bipolar disorder Daughter   . Hypertension Sister   . Hypertension Brother   . Hyperlipidemia Brother   . Heart disease Brother   . Bipolar disorder Maternal Aunt   . Suicidality Maternal Aunt     Social History:   reports that she has never smoked. She has never used smokeless tobacco. She reports that she does not drink alcohol or use drugs.  Medications: Medications Prior to Admission  Medication Sig Dispense Refill  . ALPRAZolam (XANAX) 1 MG tablet Take 1 tablet (1 mg total) by mouth 2 (two) times daily. 60 tablet 2  . amLODipine (NORVASC) 5 MG tablet Take 5 mg by mouth daily.    Marland Kitchen amphetamine-dextroamphetamine (ADDERALL) 30 MG tablet Take 1 tablet by mouth 2 (two) times daily. 60 tablet 0  . atorvastatin (LIPITOR) 20 MG tablet Take 20 mg by mouth daily.    . cholecalciferol (VITAMIN D) 1000 units tablet Take 2,000 Units by mouth daily.    . cyclobenzaprine (FLEXERIL) 10 MG tablet Take 10 mg by mouth daily as needed for muscle spasms.    . cycloSPORINE (RESTASIS) 0.05 % ophthalmic emulsion Place 1 drop into both eyes 2 (two) times daily.    . DULoxetine (CYMBALTA) 60 MG capsule Take 1 capsule (60 mg total) by mouth 2 (two) times daily. 60 capsule 2  . hydrochlorothiazide (HYDRODIURIL) 25 MG tablet Take 25 mg by mouth daily.    Marland Kitchen levothyroxine (SYNTHROID, LEVOTHROID) 137 MCG tablet Take 137 mcg by mouth daily before breakfast.    . linaclotide (  LINZESS) 145 MCG CAPS capsule Take by mouth.    . Magnesium 250 MG TABS Take 250 mg by mouth daily.    . Melatonin 5 MG TABS Take 10 mg by mouth at bedtime.    . Multiple Vitamin (MULTIVITAMIN) tablet Take 1 tablet by mouth daily.    . Omega-3 Fatty Acids (FISH OIL OMEGA-3 PO) Take by mouth.    . pantoprazole (PROTONIX) 40 MG tablet Take 40 mg by mouth daily.    . potassium chloride (K-DUR) 10 MEQ tablet Take 10 mEq by mouth 3 (three) times daily.    Marland Kitchen rOPINIRole (REQUIP) 2 MG tablet Take 2 mg by mouth 2 (two) times daily.    . vitamin C (ASCORBIC ACID) 500 MG tablet Take 1,000 mg by mouth daily.     Marland Kitchen zolpidem (AMBIEN) 10 MG tablet Take 1 tablet (10 mg total) by mouth at bedtime as needed for sleep. 30 tablet 2    No results found for this or any previous  visit (from the past 48 hour(s)).  No results found.   A comprehensive review of systems was negative.  Blood pressure 126/78, pulse 80, temperature 98 F (36.7 C), temperature source Oral, resp. rate 20, height 5\' 10"  (1.778 m), weight 112.9 kg (249 lb), SpO2 100 %.  General appearance: alert, cooperative and appears stated age Head: Normocephalic, without obvious abnormality, atraumatic Neck: supple, symmetrical, trachea midline Cardio: regular rate and rhythm Resp: clear to auscultation bilaterally Extremities: Intact sensation and capillary refill all digits.  +epl/fpl/io.  No wounds.  Pulses: 2+ and symmetric Skin: Skin color, texture, turgor normal. No rashes or lesions Neurologic: Grossly normal Incision/Wound: none  Assessment/Plan Left thumb trigger digit.  Non operative and operative treatment options were discussed with the patient and patient wishes to proceed with operative treatment. Risks, benefits, and alternatives of surgery were discussed and the patient agrees with the plan of care.   Jane [name] R 01/26/2018, 1:28 PM

## 2018-01-27 ENCOUNTER — Encounter (HOSPITAL_BASED_OUTPATIENT_CLINIC_OR_DEPARTMENT_OTHER): Payer: Self-pay | Admitting: Orthopedic Surgery

## 2018-01-27 NOTE — Addendum Note (Signed)
Addendum  created 01/27/18 0956 by Tawni Millers, CRNA   Charge Capture section accepted

## 2018-01-27 NOTE — Anesthesia Postprocedure Evaluation (Signed)
Anesthesia Post Note  Patient: Jane [name]  Procedure(s) Performed: LEFT TRIGGER THUMB RELEASE (Left Thumb)     Patient location during evaluation: PACU Anesthesia Type: MAC Level of consciousness: awake and alert Pain management: pain level controlled Vital Signs Assessment: post-procedure vital signs reviewed and stable Respiratory status: spontaneous breathing, nonlabored ventilation, respiratory function stable and patient connected to nasal cannula oxygen Cardiovascular status: stable and blood pressure returned to baseline Postop Assessment: no apparent nausea or vomiting Anesthetic complications: no    Last Vitals:  Vitals:   01/26/18 1545 01/26/18 1600  BP: 120/72 129/74  Pulse: 71 71  Resp: 12 16  Temp:  36.7 C  SpO2: 99% 99%    Last Pain:  Vitals:   01/26/18 1545  TempSrc:   PainSc: 0-No pain                 Montez Hageman

## 2018-02-03 ENCOUNTER — Encounter (HOSPITAL_COMMUNITY): Payer: Self-pay | Admitting: Licensed Clinical Social Worker

## 2018-02-03 ENCOUNTER — Ambulatory Visit (INDEPENDENT_AMBULATORY_CARE_PROVIDER_SITE_OTHER): Payer: Medicaid Other | Admitting: Licensed Clinical Social Worker

## 2018-02-03 DIAGNOSIS — F3160 Bipolar disorder, current episode mixed, unspecified: Secondary | ICD-10-CM | POA: Diagnosis not present

## 2018-02-03 NOTE — Progress Notes (Signed)
   THERAPIST PROGRESS NOTE  Session Time: 10:00 am-10:45 am  Participation Level: Active  Behavioral Response: NeatAlertEuthymic  Type of Therapy: Individual Therapy  Treatment Goals addressed: Coping  Interventions: CBT and Solution Focused  Summary: Jane [name] is a 52 y.o. female who  presents oriented x5 (person, place, situation, time and object), alert, and depressed, and cooperative to address depression and anxiety. Patient has a history of medical including chronic back pain and mental health treatment including outpatient therapy and medication managment. She admits to symptoms of mania and notes that she experiences manic episodes in the winter time. Patient denies suicidal and homicidal ideations. She denies psychosis including auditory and visual hallucinations. Patient denies substance use. Patient is at no risk for lethality at this time.  Physically: Patient is recovering from her thumb surgery. She has had difficulty sleeping lately. Patient took a "hit" of cannabis and it helped her sleep.  Spiritually/values: Patient did not report any issues. Relationship: Patient continues to be confused about her relationship with her ex. She says that he calls her and says that he loves her. Patient keeps her conversations short with him. Patient reports that her relationships with her daughters are still the same.  Emotional/Mental/Behavior: Patient reported that she feels like all of her time is going toward her grandchildren. She doesn't mind watching them but needs time to herself. Patient applied for a job and said that when she told one of her daughters they got upset because that would reduce the ability for patient to watch her children. After discussion, patient identified that she is going to follow up on the job she applied for and look into pursuing her GED. She is going to take action on these items.   Patient engaged in session. She responded well to interventions. She  continues to meet the criteria for Bipolar I disorder, most recent episode depressed. Patient will continue in outpatient therapy due to being the least restrictive service to meet her needs. Patient made moderate progress on her goals at this time.   Suicidal/Homicidal: Nowithout intent/plan  Therapist Response:  Therapist reviewed patient's recent thoughts and behaviors. Therapist utilized CBT to address mood. Therapist processed patient's feelings to identify triggers for mood. Therapist discussed setting boundaries with her daughters and pursuing some of her own interests.   Plan: Return again in 2-3 weeks. Therapist will review patient goals on or before 03.19.2019  Diagnosis: Axis I: Bipolar, Depressed    Axis II: No diagnosis    Glori Bickers, LCSW 02/03/2018

## 2018-03-06 ENCOUNTER — Ambulatory Visit (HOSPITAL_COMMUNITY): Payer: Medicaid Other | Admitting: Licensed Clinical Social Worker

## 2018-03-09 ENCOUNTER — Ambulatory Visit (INDEPENDENT_AMBULATORY_CARE_PROVIDER_SITE_OTHER): Payer: Medicaid Other | Admitting: Psychiatry

## 2018-03-09 ENCOUNTER — Encounter (HOSPITAL_COMMUNITY): Payer: Self-pay | Admitting: Psychiatry

## 2018-03-09 VITALS — BP 133/79 | HR 85 | Ht 70.0 in | Wt 248.0 lb

## 2018-03-09 DIAGNOSIS — G4700 Insomnia, unspecified: Secondary | ICD-10-CM

## 2018-03-09 DIAGNOSIS — Z630 Problems in relationship with spouse or partner: Secondary | ICD-10-CM | POA: Diagnosis not present

## 2018-03-09 DIAGNOSIS — Z736 Limitation of activities due to disability: Secondary | ICD-10-CM

## 2018-03-09 DIAGNOSIS — Z813 Family history of other psychoactive substance abuse and dependence: Secondary | ICD-10-CM

## 2018-03-09 DIAGNOSIS — F419 Anxiety disorder, unspecified: Secondary | ICD-10-CM | POA: Diagnosis not present

## 2018-03-09 DIAGNOSIS — R450 Nervousness: Secondary | ICD-10-CM

## 2018-03-09 DIAGNOSIS — Z818 Family history of other mental and behavioral disorders: Secondary | ICD-10-CM

## 2018-03-09 DIAGNOSIS — G2581 Restless legs syndrome: Secondary | ICD-10-CM

## 2018-03-09 DIAGNOSIS — F3160 Bipolar disorder, current episode mixed, unspecified: Secondary | ICD-10-CM | POA: Diagnosis not present

## 2018-03-09 MED ORDER — AMPHETAMINE-DEXTROAMPHETAMINE 30 MG PO TABS: 30.0000 mg | ORAL_TABLET | Freq: Two times a day (BID) | ORAL | 0 refills | Status: DC

## 2018-03-09 MED ORDER — ALPRAZOLAM 1 MG PO TABS: 1.0000 mg | ORAL_TABLET | Freq: Two times a day (BID) | ORAL | 2 refills | Status: DC

## 2018-03-09 MED ORDER — DULOXETINE HCL 60 MG PO CPEP: 60.0000 mg | ORAL_CAPSULE | Freq: Two times a day (BID) | ORAL | 2 refills | Status: DC

## 2018-03-09 NOTE — Patient Instructions (Signed)
Try Belsomra sample Ask for  Neurology referral

## 2018-03-09 NOTE — Progress Notes (Signed)
Patient provided Belsomra 20 mg packet with 3 pills for trial at night for sleep per Dr. Harrington Challenger order.  Dr. Harrington Challenger provided the patient with the 3 samples and patient to let us know if help with sleep.
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
## 2018-03-22 ENCOUNTER — Other Ambulatory Visit (HOSPITAL_COMMUNITY): Payer: Self-pay | Admitting: Psychiatry

## 2018-04-08 ENCOUNTER — Ambulatory Visit (HOSPITAL_COMMUNITY): Payer: Medicaid Other | Admitting: Psychiatry

## 2018-04-13 ENCOUNTER — Encounter (HOSPITAL_COMMUNITY): Payer: Self-pay | Admitting: Licensed Clinical Social Worker

## 2018-04-13 ENCOUNTER — Ambulatory Visit (INDEPENDENT_AMBULATORY_CARE_PROVIDER_SITE_OTHER): Payer: Medicaid Other | Admitting: Licensed Clinical Social Worker

## 2018-04-13 DIAGNOSIS — F3130 Bipolar disorder, current episode depressed, mild or moderate severity, unspecified: Secondary | ICD-10-CM | POA: Diagnosis not present

## 2018-04-13 NOTE — Progress Notes (Signed)
   THERAPIST PROGRESS NOTE  Session Time: 11:00 am-11:45 am  Participation Level: Active  Behavioral Response: NeatAlertEuthymic  Type of Therapy: Individual Therapy  Treatment Goals addressed: Coping  Interventions: CBT and Solution Focused  Summary: Jane [name] is a 52 y.o. female who  presents oriented x5 (person, place, situation, time and object), alert, and depressed, and cooperative to address depression and anxiety. Patient has a history of medical including chronic back pain and mental health treatment including outpatient therapy and medication managment. She admits to symptoms of mania and notes that she experiences manic episodes in the winter time. Patient denies suicidal and homicidal ideations. She denies psychosis including auditory and visual hallucinations. Patient denies substance use. Patient is at no risk for lethality at this time.  Physically: Patient is physically worn out. She has been staying sick due to stress.  Spiritually/values: Patient did not report any issues. Relationship: Patient has started to talk to her ex again. She hates herself for doing it and knows that she needs to cut off the relationship. She recognizes that it is unhealthy.  Emotional/Mental/Behavior: Patient feels depressed and stressed due to getting back to with her ex boyfriend. Patient understands she needs to cut off the relationship emotionally and physically. Patient reported minimal progress on her goals due to getting back into unhealthy relationship with her ex.   Patient engaged in session. She responded well to interventions. She continues to meet the criteria for Bipolar I disorder, most recent episode depressed. Patient will continue in outpatient therapy due to being the least restrictive service to meet her needs. Patient made moderate progress on her goals at this time.   Suicidal/Homicidal: Nowithout intent/plan  Therapist Response:  Therapist reviewed patient's recent  thoughts and behaviors. Therapist utilized CBT to address mood. Therapist reviewed patient's goals. Therapist processed patient's feelings to identify triggers for mood. Therapist discussed steps she could take to remove herself from an unhealthy relationship.   Plan: Return again in 2-3 weeks. Patient needs a new assessment on next visit.   Diagnosis: Axis I: Bipolar, Depressed    Axis II: No diagnosis    Glori Bickers, LCSW 04/13/2018

## 2018-04-14 ENCOUNTER — Ambulatory Visit (HOSPITAL_COMMUNITY): Payer: Medicaid Other | Admitting: Psychiatry

## 2018-04-16 ENCOUNTER — Encounter (HOSPITAL_COMMUNITY): Payer: Self-pay | Admitting: Psychiatry

## 2018-04-16 ENCOUNTER — Ambulatory Visit (INDEPENDENT_AMBULATORY_CARE_PROVIDER_SITE_OTHER): Payer: Medicaid Other | Admitting: Psychiatry

## 2018-04-16 VITALS — BP 135/85 | HR 108 | Ht 70.0 in | Wt 252.0 lb

## 2018-04-16 DIAGNOSIS — F3130 Bipolar disorder, current episode depressed, mild or moderate severity, unspecified: Secondary | ICD-10-CM | POA: Diagnosis not present

## 2018-04-16 MED ORDER — DULOXETINE HCL 60 MG PO CPEP: 60.0000 mg | ORAL_CAPSULE | Freq: Two times a day (BID) | ORAL | 2 refills | Status: DC

## 2018-04-16 MED ORDER — ALPRAZOLAM 1 MG PO TABS: 1.0000 mg | ORAL_TABLET | Freq: Two times a day (BID) | ORAL | 2 refills | Status: DC

## 2018-04-16 MED ORDER — AMPHETAMINE-DEXTROAMPHETAMINE 30 MG PO TABS: 30.0000 mg | ORAL_TABLET | Freq: Two times a day (BID) | ORAL | 0 refills | Status: DC

## 2018-04-16 MED ORDER — ZOLPIDEM TARTRATE 10 MG PO TABS: 10.0000 mg | ORAL_TABLET | Freq: Every evening | ORAL | 2 refills | Status: DC | PRN
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
## 2018-05-20 ENCOUNTER — Ambulatory Visit (HOSPITAL_COMMUNITY): Payer: Self-pay | Admitting: Licensed Clinical Social Worker

## 2018-05-25 NOTE — Progress Notes (Signed)
Established Venous Insufficiency   History of Present Illness   Jane [name] is a 52 y.o. (02-23-1966) female prior hx of LLE DVT who presents with chief complaint: re-exacerbation of bilateral leg rash.  This patient started getting para/anesthesia and a rash in L>R foot around Mother's Day.  Initially this resolved with steroid but recently reflared and has worsened.  The rash is associated with anesthesia L foot and aphthous ulcers and B leg swelling.  The patient has not been on compression stockings.  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  Current Outpatient Medications  Medication Sig Dispense Refill  . ALPRAZolam (XANAX) 1 MG tablet Take 1 tablet (1 mg total) by mouth 2 (two) times daily. 60 tablet 2  . amLODipine (NORVASC) 5 MG tablet Take 5 mg by mouth daily.    Marland Kitchen amphetamine-dextroamphetamine (ADDERALL) 30 MG tablet Take 1 tablet by mouth 2 (two) times daily. 60 tablet 0  . amphetamine-dextroamphetamine (ADDERALL) 30 MG tablet Take 1 tablet by mouth 2 (two) times daily. 60 tablet 0  . atorvastatin (LIPITOR) 20 MG tablet Take 20 mg by mouth daily.    . cholecalciferol (VITAMIN D) 1000 units tablet Take 2,000 Units by mouth daily.    . cyclobenzaprine (FLEXERIL) 10 MG tablet Take 10 mg by mouth daily as needed for muscle spasms.    . cycloSPORINE (RESTASIS) 0.05 % ophthalmic emulsion Place 1 drop into both eyes 2 (two) times daily.    . DULoxetine (CYMBALTA) 60 MG capsule Take 1 capsule (60 mg total) by mouth 2 (two) times daily. 60 capsule 2  . hydrochlorothiazide (HYDRODIURIL) 25 MG tablet Take 25 mg by mouth daily.    Marland Kitchen HYDROcodone-acetaminophen (NORCO) 5-325 MG tablet 1-2 tabs po q6 hours prn pain 20 tablet 0  . levothyroxine (SYNTHROID, LEVOTHROID) 137 MCG tablet Take 137 mcg by mouth daily before  breakfast.    . linaclotide (LINZESS) 145 MCG CAPS capsule Take by mouth.    . Magnesium 250 MG TABS Take 250 mg by mouth daily.    . Melatonin 5 MG TABS Take 10 mg by mouth at bedtime.    . Multiple Vitamin (MULTIVITAMIN) tablet Take 1 tablet by mouth daily.    . Omega-3 Fatty Acids (FISH OIL OMEGA-3 PO) Take by mouth.    . pantoprazole (PROTONIX) 40 MG tablet Take 40 mg by mouth daily.    . potassium chloride (K-DUR) 10 MEQ tablet Take 10 mEq by mouth 3 (three) times daily.    Marland Kitchen rOPINIRole (REQUIP) 2 MG tablet Take 2 mg by mouth 2 (two) times daily.    . vitamin C (ASCORBIC ACID) 500 MG tablet Take 1,000 mg by mouth daily.     Marland Kitchen zolpidem (AMBIEN) 10 MG tablet Take 1 tablet (10 mg total) by mouth at bedtime as needed for sleep. 30 tablet 2   No current facility-administered medications for this visit.     Allergies  Allergen Reactions  . Abilify [Aripiprazole] Other (See Comments)    BECOMES VIOLENT  . Gabapentin Other (See Comments)    NIGHTMARES  . Seroquel [Quetiapine Fumarate] Other (See Comments)    BECOMES VIOLENT  . Thorazine [Chlorpromazine] Other (See Comments)    SEVERE ANXIETY    On ROS today: +rash, +anesthesia   Physical Examination   Vitals:   05/27/18 1130 05/27/18 1132  BP: Marland Kitchen)  142/91 (!) 152/88  Pulse: 96   Resp: 16   Temp: 98.7 F (37.1 C)   TempSrc: Oral   SpO2: 97%   Weight: 256 lb 9.6 oz (116.4 kg)   Height: 5\' 10"  (1.778 m)    Body mass index is 36.82 kg/m.  General Alert, O x 3, WD, NAD  Pulmonary Sym exp, good B air movt, CTA B  Cardiac RRR, Nl S1, S2, no Murmurs, No rubs, No S3,S4  Vascular Vessel Right Left  Radial Palpable Palpable  Brachial Palpable Palpable  Carotid Palpable, No Bruit Palpable, No Bruit  Aorta Not palpable N/A  Femoral Palpable Palpable  Popliteal Not palpable Not palpable  PT Palpable Palpable  DP Palpable Palpable    Gastro- intestinal soft, non-distended, non-tender to palpation, No guarding or rebound, no  HSM, no masses, no CVAT B, No palpable prominent aortic pulse,    Musculo- skeletal M/S 5/5 throughout  , Extremities without ischemic changes  , Non-pitting edema present: R 1+, L 1-2+, No visible varicosities , No Lipodermatosclerosis present, L >> R papular rash on calves  Neurologic Pain and light touch intact in extremities except for decreased sensation in L foot, Motor exam as listed above    Medical Decision Making   Jane [name] is a 52 y.o. 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  At this point, her venous work-up can be delayed until her active sx can be fully worked up.  Will have her follow up in 3 months with Dr. Donzetta Matters, which has an active interest in proximal venous disease.  Thank you for allowing [place] to participate in this patient's care.   Adele Barthel, MD, FACS [facility] Office: [phone] Pager: [phone]

## 2018-05-27 ENCOUNTER — Encounter: Payer: Self-pay | Admitting: Vascular Surgery

## 2018-05-27 ENCOUNTER — Other Ambulatory Visit: Payer: Self-pay

## 2018-05-27 ENCOUNTER — Ambulatory Visit: Payer: Medicaid Other | Admitting: Vascular Surgery

## 2018-05-27 VITALS — BP 152/88 | HR 96 | Temp 98.7°F | Resp 16 | Ht 70.0 in | Wt 256.6 lb

## 2018-05-27 DIAGNOSIS — I872 Venous insufficiency (chronic) (peripheral): Secondary | ICD-10-CM | POA: Diagnosis not present

## 2018-06-16 ENCOUNTER — Ambulatory Visit (INDEPENDENT_AMBULATORY_CARE_PROVIDER_SITE_OTHER): Payer: Medicaid Other | Admitting: Psychiatry

## 2018-06-16 ENCOUNTER — Encounter (HOSPITAL_COMMUNITY): Payer: Self-pay | Admitting: Psychiatry

## 2018-06-16 VITALS — BP 147/88 | HR 93 | Ht 70.0 in | Wt 256.0 lb

## 2018-06-16 DIAGNOSIS — F3130 Bipolar disorder, current episode depressed, mild or moderate severity, unspecified: Secondary | ICD-10-CM | POA: Diagnosis not present

## 2018-06-16 MED ORDER — ALPRAZOLAM 1 MG PO TABS: 1.0000 mg | ORAL_TABLET | Freq: Two times a day (BID) | ORAL | 2 refills | Status: DC

## 2018-06-16 MED ORDER — DULOXETINE HCL 60 MG PO CPEP: 60.0000 mg | ORAL_CAPSULE | Freq: Two times a day (BID) | ORAL | 2 refills | Status: DC

## 2018-06-16 NOTE — Progress Notes (Signed)
BH MD/PA/NP OP Progress Note  06/16/2018 11:22 AM Jane [name]  MRN:  [id number]  Chief Complaint:  Chief Complaint    Depression; Anxiety; Follow-up     HPI: This patient is a 52year-old divorced white female who lives alone in [place]. She is on disability due to bipolar disorder.  The patient states that she became very depressed when her mother died in 2000-02-19 of a brain aneurysm. She started getting. Sad but eventually became manic and develop mood swings and agitated behavior. She's been on numerous medications over the years. In February 19, 1988 she also had postpartum depression. For many years she was on lithium but it caused a bad taste in her mouth and her doctor was worried it might be starting to affect her renal function so was discontinued. She also thinks that it stopped working after a number of years  The patient returns after 2 months.  She is having a lot of physical symptoms which no one seems to be able to explain.  She is going between her primary doctor Dr. [name] to Dr. Merlene Laughter neurology.  She is been having pain and numbness in her lower extremities along with a rash that keeps coming back in both her lower and upper extremities.  She is fatigued all the time and has no energy.  She is angry and irritable because nobody can seem to give her an answer.  She is having a lot of blood work done on Friday.  It has been discovered that she has neuropathy.  She does think the medications here have helped her to some degree but she is mostly frustrated because her medical questions are going unanswered Visit Diagnosis:    ICD-10-CM   1. Bipolar I disorder, most recent episode depressed ([place]) F31.30     Past Psychiatric History: Long-term outpatient treatment for bipolar disorder  Past Medical History:  Past Medical History:  Diagnosis Date  . Anxiety   . Bipolar disorder ([place])   . Depression   . GERD (gastroesophageal reflux disease)   . High cholesterol   . History of DVT (deep  vein thrombosis)    left leg  . Hypertension    states under control with meds., has been on med. x 2 years  . Hypothyroidism   . Obsessive-compulsive disorder   . Restless leg syndrome   . Trigger thumb of left hand 02/18/18    Past Surgical History:  Procedure Laterality Date  . ABDOMINAL HYSTERECTOMY  06/2016   complete  . CHOLECYSTECTOMY    . LUMBAR FUSION  11/21/2000   L5-S1  . LUMBAR SPINE SURGERY     x 2 others  . TRIGGER FINGER RELEASE Right 12/01/2017   Procedure: RELEASE TRIGGER FINGER/A-1 PULLEY RIGHT THUMB;  Surgeon: Leanora Cover, MD;  Location: [facility];  Service: Orthopedics;  Laterality: Right;  . TRIGGER FINGER RELEASE Left 01/26/2018   Procedure: LEFT TRIGGER THUMB RELEASE;  Surgeon: Leanora Cover, MD;  Location: [facility];  Service: Orthopedics;  Laterality: Left;    Family Psychiatric History: See below  Family History:  Family History  Problem Relation Age of Onset  . Heart disease Mother   . Hyperlipidemia Mother   . Hypertension Mother   . Bipolar disorder Mother   . Diabetes Father   . Heart disease Father   . Hyperlipidemia Father   . Hypertension Father   . Drug abuse Daughter   . ADD / ADHD Daughter   . Drug abuse Daughter   .  Anxiety disorder Daughter   . Bipolar disorder Daughter   . Hypertension Sister   . Hypertension Brother   . Hyperlipidemia Brother   . Heart disease Brother   . Bipolar disorder Maternal Aunt   . Suicidality Maternal Aunt     Social History:  Social History   Socioeconomic History  . Marital status: Divorced    Spouse name: Not on file  . Number of children: Not on file  . Years of education: Not on file  . Highest education level: Not on file  Occupational History  . Not on file  Social Needs  . Financial resource strain: Not on file  . Food insecurity:    Worry: Not on file    Inability: Not on file  . Transportation needs:    Medical: Not on file    Non-medical: Not on  file  Tobacco Use  . Smoking status: Never Smoker  . Smokeless tobacco: Never Used  Substance and Sexual Activity  . Alcohol use: No  . Drug use: No  . Sexual activity: Yes    Partners: Male    Birth control/protection: Surgical  Lifestyle  . Physical activity:    Days per week: Not on file    Minutes per session: Not on file  . Stress: Not on file  Relationships  . Social connections:    Talks on phone: Not on file    Gets together: Not on file    Attends religious service: Not on file    Active member of club or organization: Not on file    Attends meetings of clubs or organizations: Not on file    Relationship status: Not on file  Other Topics Concern  . Not on file  Social History Narrative  . Not on file    Allergies:  Allergies  Allergen Reactions  . Abilify [Aripiprazole] Other (See Comments)    BECOMES VIOLENT  . Gabapentin Other (See Comments)    NIGHTMARES  . Seroquel [Quetiapine Fumarate] Other (See Comments)    BECOMES VIOLENT  . Thorazine [Chlorpromazine] Other (See Comments)    SEVERE ANXIETY    Metabolic Disorder Labs: No results found for: HGBA1C, MPG No results found for: PROLACTIN No results found for: CHOL, TRIG, HDL, CHOLHDL, VLDL, LDLCALC No results found for: TSH  Therapeutic Level Labs: No results found for: LITHIUM Lab Results  Component Value Date   VALPROATE 61.2 03/23/2014   No components found for:  CBMZ  Current Medications: Current Outpatient Medications  Medication Sig Dispense Refill  . ALPRAZolam (XANAX) 1 MG tablet Take 1 tablet (1 mg total) by mouth 2 (two) times daily. 60 tablet 2  . amLODipine (NORVASC) 5 MG tablet Take 5 mg by mouth daily.    Marland Kitchen amphetamine-dextroamphetamine (ADDERALL) 30 MG tablet Take 1 tablet by mouth 2 (two) times daily. 60 tablet 0  . amphetamine-dextroamphetamine (ADDERALL) 30 MG tablet Take 1 tablet by mouth 2 (two) times daily. 60 tablet 0  . atorvastatin (LIPITOR) 20 MG tablet Take 20 mg by  mouth daily.    . cetirizine (ZYRTEC) 10 MG tablet Take 10 mg by mouth daily.    . cholecalciferol (VITAMIN D) 1000 units tablet Take 2,000 Units by mouth daily.    . cyclobenzaprine (FLEXERIL) 10 MG tablet Take 10 mg by mouth daily as needed for muscle spasms.    . cycloSPORINE (RESTASIS) 0.05 % ophthalmic emulsion Place 1 drop into both eyes 2 (two) times daily.    . DULoxetine (CYMBALTA)  60 MG capsule Take 1 capsule (60 mg total) by mouth 2 (two) times daily. 60 capsule 2  . hydrochlorothiazide (HYDRODIURIL) 25 MG tablet Take 25 mg by mouth daily.    Marland Kitchen HYDROcodone-acetaminophen (NORCO) 5-325 MG tablet 1-2 tabs po q6 hours prn pain 20 tablet 0  . ketoconazole (NIZORAL) 2 % cream Apply 1 application topically 2 (two) times daily.    Marland Kitchen levothyroxine (SYNTHROID, LEVOTHROID) 137 MCG tablet Take 137 mcg by mouth daily before breakfast.    . linaclotide (LINZESS) 145 MCG CAPS capsule Take by mouth.    . Magnesium 250 MG TABS Take 250 mg by mouth daily.    . Melatonin 5 MG TABS Take 10 mg by mouth at bedtime.    . Multiple Vitamin (MULTIVITAMIN) tablet Take 1 tablet by mouth daily.    Marland Kitchen nystatin (MYCOSTATIN/NYSTOP) powder Apply 100,000 g topically 2 (two) times daily.    . Omega-3 Fatty Acids (FISH OIL OMEGA-3 PO) Take by mouth.    . pantoprazole (PROTONIX) 40 MG tablet Take 40 mg by mouth daily.    . potassium chloride (K-DUR) 10 MEQ tablet Take 10 mEq by mouth 3 (three) times daily.    Marland Kitchen rOPINIRole (REQUIP) 2 MG tablet Take 2 mg by mouth 2 (two) times daily.    Marland Kitchen terbinafine (LAMISIL) 250 MG tablet Take 250 mg by mouth daily.    Marland Kitchen triamcinolone cream (KENALOG) 0.1 % Apply 1 application topically 4 (four) times daily.    . vitamin C (ASCORBIC ACID) 500 MG tablet Take 1,000 mg by mouth daily.      No current facility-administered medications for this visit.      Musculoskeletal: Strength & Muscle Tone: within normal limits Gait & Station: normal Patient leans: N/A  Psychiatric Specialty  Exam: Review of Systems  Constitutional: Positive for malaise/fatigue.  Musculoskeletal: Positive for joint pain.  Skin: Positive for rash.  Neurological: Positive for tingling.  Psychiatric/Behavioral: The patient is nervous/anxious.   All other systems reviewed and are negative.   Blood pressure (!) 147/88, pulse 93, height 5\' 10"  (1.778 m), weight 256 lb (116.1 kg), SpO2 93 %.Body mass index is 36.73 kg/m.  General Appearance: Casual and Fairly Groomed  Eye Contact:  Good  Speech:  Clear and Coherent  Volume:  Normal  Mood:  Anxious and Irritable  Affect:  Constricted  Thought Process:  Goal Directed  Orientation:  Full (Time, Place, and Person)  Thought Content: Rumination   Suicidal Thoughts:  No  Homicidal Thoughts:  No  Memory:  Immediate;   Good Recent;   Good Remote;   Good  Judgement:  Fair  Insight:  Fair  Psychomotor Activity:  Decreased  Concentration:  Concentration: Fair and Attention Span: Fair  Recall:  Good  Fund of Knowledge: Good  Language: Good  Akathisia:  No  Handed:  Right  AIMS (if indicated): not done  Assets:  Communication Skills Desire for Improvement Resilience Social Support Talents/Skills  ADL's:  Intact  Cognition: WNL  Sleep:  Fair   Screenings: PHQ2-9     Nutrition from 09/22/2014 in [facility]  PHQ-2 Total Score  4  PHQ-9 Total Score  15       Assessment and Plan: This patient is a 52 year old female with a history of ADHD and presumed bipolar disorder.  She is frustrated about her medical condition and not getting answers.  However she does think her psychiatric medications are still helpful.  She will continue Xanax 1  mg twice daily for anxiety and Cymbalta 60 mg twice daily for depression.  She also gets Adderall 30 mg twice daily from her primary doctor.  She will continue her counseling here and return to see me in 6 weeks   [name], MD 06/16/2018, 11:22 AM

## 2018-06-17 ENCOUNTER — Ambulatory Visit (INDEPENDENT_AMBULATORY_CARE_PROVIDER_SITE_OTHER): Payer: Medicaid Other | Admitting: Licensed Clinical Social Worker

## 2018-06-17 ENCOUNTER — Encounter (HOSPITAL_COMMUNITY): Payer: Self-pay | Admitting: Licensed Clinical Social Worker

## 2018-06-17 DIAGNOSIS — F3130 Bipolar disorder, current episode depressed, mild or moderate severity, unspecified: Secondary | ICD-10-CM

## 2018-06-17 NOTE — Progress Notes (Signed)
   THERAPIST PROGRESS NOTE  Session Time: 11:00 am-11:45 am  Participation Level: Active  Behavioral Response: NeatAlertEuthymic  Type of Therapy: Individual Therapy  Treatment Goals addressed: Coping  Interventions: CBT and Solution Focused  Summary: Jane [name] is a 52 y.o. female who  presents oriented x5 (person, place, situation, time and object), alert, and depressed, and cooperative to address depression and anxiety. Patient has a history of medical including chronic back pain and mental health treatment including outpatient therapy and medication managment. She admits to symptoms of mania and notes that she experiences manic episodes in the winter time. Patient denies suicidal and homicidal ideations. She denies psychosis including auditory and visual hallucinations. Patient denies substance use. Patient is at no risk for lethality at this time.  Physically: Patient continues to be physically worn out. She has developed a rash that continues to come back.  Spiritually/values: Patient feels like she is being used and disrespected by her family. She feels taken advantage of by her daughters. They ask her to watch their children a lot and they can be disrespectful toward her. .  Emotional/Mental/Behavior: Patient feels feels stressed out. She doesn't like how her children are treating her. Patient is going to talk to her daughter about feeling used and disrespected.    Patient engaged in session. She responded well to interventions. She continues to meet the criteria for Bipolar I disorder, most recent episode depressed. Patient will continue in outpatient therapy due to being the least restrictive service to meet her needs. Patient made moderate progress on her goals at this time.   Suicidal/Homicidal: Nowithout intent/plan  Therapist Response:  Therapist reviewed patient's recent thoughts and behaviors. Therapist utilized CBT to address mood. Therapist reviewed patient's goals.  Therapist processed patient's feelings to identify triggers for mood. Therapist discussed her relationships and the stress that they triggers.   Plan: Return again in 2-3 weeks. Patient needs a new assessment on next visit.   Diagnosis: Axis I: Bipolar, Depressed    Axis II: No diagnosis    Glori Bickers, LCSW 06/17/2018

## 2018-06-25 ENCOUNTER — Ambulatory Visit (INDEPENDENT_AMBULATORY_CARE_PROVIDER_SITE_OTHER): Payer: Medicaid Other | Admitting: Otolaryngology

## 2018-06-25 DIAGNOSIS — K120 Recurrent oral aphthae: Secondary | ICD-10-CM

## 2018-07-15 ENCOUNTER — Encounter (HOSPITAL_COMMUNITY): Payer: Self-pay | Admitting: Licensed Clinical Social Worker

## 2018-07-15 ENCOUNTER — Ambulatory Visit (INDEPENDENT_AMBULATORY_CARE_PROVIDER_SITE_OTHER): Payer: Medicaid Other | Admitting: Licensed Clinical Social Worker

## 2018-07-15 DIAGNOSIS — F3130 Bipolar disorder, current episode depressed, mild or moderate severity, unspecified: Secondary | ICD-10-CM

## 2018-07-15 NOTE — Progress Notes (Signed)
   THERAPIST PROGRESS NOTE  Session Time: 10:40 am-11:30 am  Participation Level: Active  Behavioral Response: NeatAlertEuthymic  Type of Therapy: Individual Therapy  Treatment Goals addressed: Coping  Interventions: CBT and Solution Focused  Summary: Jane [name] is a 52 y.o. female who  presents oriented x5 (person, place, situation, time and object), alert, and depressed, and cooperative to address depression and anxiety. Patient has a history of medical including chronic back pain and mental health treatment including outpatient therapy and medication managment. She admits to symptoms of mania and notes that she experiences manic episodes in the winter time. Patient denies suicidal and homicidal ideations. She denies psychosis including auditory and visual hallucinations. Patient denies substance use. Patient is at no risk for lethality at this time.  Physically: Patient fell and she bruised one side of her body.   Spiritually/values: No issues identified.  Relationships: Patient has a strained relationship with her daughters. Both of them said they couldn't trust her. One daughter shared she had a pill problem and patient shared it with the other daughter because she was upset. Her daughter told the other daughter she knows about her pill problem because she was mad at the patient. Her daughter told her she doesn't want a relationship with her anymore but she can still see her grandchildren.  Emotional/Mental/Behavior: Patient feels depressed. She is upset about her relationship with her children. She has blocked them and her ex boyfriend. She is planning on focusing on her birthday beach trip and signing up for a GED class. .    Patient engaged in session. She responded well to interventions. She continues to meet the criteria for Bipolar I disorder, most recent episode depressed. Patient will continue in outpatient therapy due to being the least restrictive service to meet her needs.  Patient made moderate progress on her goals at this time.   Suicidal/Homicidal: Nowithout intent/plan  Therapist Response:  Therapist reviewed patient's recent thoughts and behaviors. Therapist utilized CBT to address mood. Therapist reviewed patient's goals. Therapist processed patient's feelings to identify triggers for mood. Therapist assisted patient in identifying small steps she can take to improve mood.   Plan: Return again in 2-3 weeks.   Diagnosis: Axis I: Bipolar, Depressed    Axis II: No diagnosis    Glori Bickers, LCSW 07/15/2018

## 2018-07-28 ENCOUNTER — Ambulatory Visit (INDEPENDENT_AMBULATORY_CARE_PROVIDER_SITE_OTHER): Payer: Medicaid Other | Admitting: Psychiatry

## 2018-07-28 ENCOUNTER — Encounter (HOSPITAL_COMMUNITY): Payer: Self-pay | Admitting: Psychiatry

## 2018-07-28 VITALS — BP 127/78 | HR 101 | Ht 70.0 in | Wt 260.0 lb

## 2018-07-28 DIAGNOSIS — Z639 Problem related to primary support group, unspecified: Secondary | ICD-10-CM

## 2018-07-28 DIAGNOSIS — F3130 Bipolar disorder, current episode depressed, mild or moderate severity, unspecified: Secondary | ICD-10-CM | POA: Diagnosis not present

## 2018-07-28 DIAGNOSIS — F419 Anxiety disorder, unspecified: Secondary | ICD-10-CM | POA: Diagnosis not present

## 2018-07-28 MED ORDER — ZOLPIDEM TARTRATE 10 MG PO TABS: 10.0000 mg | ORAL_TABLET | Freq: Every evening | ORAL | 2 refills | Status: DC | PRN

## 2018-07-28 MED ORDER — DULOXETINE HCL 60 MG PO CPEP: 60.0000 mg | ORAL_CAPSULE | Freq: Two times a day (BID) | ORAL | 2 refills | Status: DC

## 2018-07-28 MED ORDER — AMPHETAMINE-DEXTROAMPHETAMINE 30 MG PO TABS: 30.0000 mg | ORAL_TABLET | Freq: Two times a day (BID) | ORAL | 0 refills | Status: DC

## 2018-07-28 MED ORDER — ALPRAZOLAM 1 MG PO TABS: 1.0000 mg | ORAL_TABLET | Freq: Two times a day (BID) | ORAL | 2 refills | Status: DC

## 2018-07-28 NOTE — Progress Notes (Signed)
BH MD/PA/NP OP Progress Note  07/28/2018 11:56 AM Jane [name]  MRN:  [id number]  Chief Complaint:  Chief Complaint    Depression; Anxiety; Follow-up     HPI: This patient is a 52year-old divorced white female who lives alone in [place]. She is on disability due to bipolar disorder.  The patient states that she became very depressed when her mother died in 2000-02-22 of a brain aneurysm. She started getting. Sad but eventually became manic and develop mood swings and agitated behavior. She's been on numerous medications over the years. In 22-Feb-1988 she also had postpartum depression. For many years she was on lithium but it caused a bad taste in her mouth and her doctor was worried it might be starting to affect her renal function so was discontinued. She also thinks that it stopped working after a number of years  The patient returns after 2 months.  She states that she has had a bad falling out with 2 of her daughters.  One confided something in private about herself.  She was upset and crying about it and the other daughter demanded to know what it was.  She told the first daughter secret and then the second daughter got upset too.  First daughter then at stated that the patient had lied.  Now both daughters are upset with her.  She is not allowed to see some of the grandchildren right now and 1 of the daughters is totally refusing to speak to her while the other is only speaking to her to discuss the grandchildren nothing else.  She is upset and tearful today.  She denies thoughts of self-harm.  She was not sleeping well and went back to the Ambien which has been helping.  She is meeting with Merrily Pew here to discuss this in therapy. Visit Diagnosis:    ICD-10-CM   1. Bipolar I disorder, most recent episode depressed ([place]) F31.30     Past Psychiatric History: Long-term outpatient treatment for bipolar disorder  Past Medical History:  Past Medical History:  Diagnosis Date  . Anxiety   . Bipolar  disorder ([place])   . Depression   . GERD (gastroesophageal reflux disease)   . High cholesterol   . History of DVT (deep vein thrombosis)    left leg  . Hypertension    states under control with meds., has been on med. x 2 years  . Hypothyroidism   . Obsessive-compulsive disorder   . Restless leg syndrome   . Trigger thumb of left hand 21-Feb-2018    Past Surgical History:  Procedure Laterality Date  . ABDOMINAL HYSTERECTOMY  06/2016   complete  . CHOLECYSTECTOMY    . LUMBAR FUSION  11/21/2000   L5-S1  . LUMBAR SPINE SURGERY     x 2 others  . TRIGGER FINGER RELEASE Right 12/01/2017   Procedure: RELEASE TRIGGER FINGER/A-1 PULLEY RIGHT THUMB;  Surgeon: Leanora Cover, MD;  Location: [facility];  Service: Orthopedics;  Laterality: Right;  . TRIGGER FINGER RELEASE Left 01/26/2018   Procedure: LEFT TRIGGER THUMB RELEASE;  Surgeon: Leanora Cover, MD;  Location: [facility];  Service: Orthopedics;  Laterality: Left;    Family Psychiatric History: See below  Family History:  Family History  Problem Relation Age of Onset  . Heart disease Mother   . Hyperlipidemia Mother   . Hypertension Mother   . Bipolar disorder Mother   . Diabetes Father   . Heart disease Father   . Hyperlipidemia Father   .  Hypertension Father   . Drug abuse Daughter   . ADD / ADHD Daughter   . Drug abuse Daughter   . Anxiety disorder Daughter   . Bipolar disorder Daughter   . Hypertension Sister   . Hypertension Brother   . Hyperlipidemia Brother   . Heart disease Brother   . Bipolar disorder Maternal Aunt   . Suicidality Maternal Aunt     Social History:  Social History   Socioeconomic History  . Marital status: Divorced    Spouse name: Not on file  . Number of children: Not on file  . Years of education: Not on file  . Highest education level: Not on file  Occupational History  . Not on file  Social Needs  . Financial resource strain: Not on file  . Food insecurity:     Worry: Not on file    Inability: Not on file  . Transportation needs:    Medical: Not on file    Non-medical: Not on file  Tobacco Use  . Smoking status: Never Smoker  . Smokeless tobacco: Never Used  Substance and Sexual Activity  . Alcohol use: No  . Drug use: No  . Sexual activity: Yes    Partners: Male    Birth control/protection: Surgical  Lifestyle  . Physical activity:    Days per week: Not on file    Minutes per session: Not on file  . Stress: Not on file  Relationships  . Social connections:    Talks on phone: Not on file    Gets together: Not on file    Attends religious service: Not on file    Active member of club or organization: Not on file    Attends meetings of clubs or organizations: Not on file    Relationship status: Not on file  Other Topics Concern  . Not on file  Social History Narrative  . Not on file    Allergies:  Allergies  Allergen Reactions  . Abilify [Aripiprazole] Other (See Comments)    BECOMES VIOLENT  . Gabapentin Other (See Comments)    NIGHTMARES  . Seroquel [Quetiapine Fumarate] Other (See Comments)    BECOMES VIOLENT  . Thorazine [Chlorpromazine] Other (See Comments)    SEVERE ANXIETY    Metabolic Disorder Labs: No results found for: HGBA1C, MPG No results found for: PROLACTIN No results found for: CHOL, TRIG, HDL, CHOLHDL, VLDL, LDLCALC No results found for: TSH  Therapeutic Level Labs: No results found for: LITHIUM Lab Results  Component Value Date   VALPROATE 61.2 03/23/2014   No components found for:  CBMZ  Current Medications: Current Outpatient Medications  Medication Sig Dispense Refill  . ALPRAZolam (XANAX) 1 MG tablet Take 1 tablet (1 mg total) by mouth 2 (two) times daily. 60 tablet 2  . amLODipine (NORVASC) 10 MG tablet Take 10 mg by mouth every morning.    Marland Kitchen amphetamine-dextroamphetamine (ADDERALL) 30 MG tablet Take 1 tablet by mouth 2 (two) times daily. 60 tablet 0  . amphetamine-dextroamphetamine  (ADDERALL) 30 MG tablet Take 1 tablet by mouth 2 (two) times daily. 60 tablet 0  . atorvastatin (LIPITOR) 20 MG tablet Take 20 mg by mouth daily.    . cetirizine (ZYRTEC) 10 MG tablet Take 10 mg by mouth daily.    . cholecalciferol (VITAMIN D) 1000 units tablet Take 2,000 Units by mouth daily.    . cyclobenzaprine (FLEXERIL) 10 MG tablet Take 10 mg by mouth daily as needed for muscle spasms.    Marland Kitchen  cycloSPORINE (RESTASIS) 0.05 % ophthalmic emulsion Place 1 drop into both eyes 2 (two) times daily.    . DULoxetine (CYMBALTA) 60 MG capsule Take 1 capsule (60 mg total) by mouth 2 (two) times daily. 60 capsule 2  . hydrochlorothiazide (HYDRODIURIL) 25 MG tablet Take 25 mg by mouth daily.    Marland Kitchen HYDROcodone-acetaminophen (NORCO) 5-325 MG tablet 1-2 tabs po q6 hours prn pain 20 tablet 0  . ketoconazole (NIZORAL) 2 % cream Apply 1 application topically 2 (two) times daily.    Marland Kitchen levothyroxine (SYNTHROID, LEVOTHROID) 137 MCG tablet Take 137 mcg by mouth daily before breakfast.    . linaclotide (LINZESS) 145 MCG CAPS capsule Take by mouth.    . Magnesium 250 MG TABS Take 250 mg by mouth daily.    . Melatonin 5 MG TABS Take 10 mg by mouth at bedtime.    . Multiple Vitamin (MULTIVITAMIN) tablet Take 1 tablet by mouth daily.    Marland Kitchen nystatin (MYCOSTATIN/NYSTOP) powder Apply 100,000 g topically 2 (two) times daily.    . Omega-3 Fatty Acids (FISH OIL OMEGA-3 PO) Take by mouth.    . pantoprazole (PROTONIX) 40 MG tablet Take 40 mg by mouth daily.    . potassium chloride (K-DUR) 10 MEQ tablet Take 10 mEq by mouth 3 (three) times daily.    Marland Kitchen rOPINIRole (REQUIP) 2 MG tablet Take 2 mg by mouth 2 (two) times daily.    Marland Kitchen terbinafine (LAMISIL) 250 MG tablet Take 250 mg by mouth daily.    Marland Kitchen triamcinolone cream (KENALOG) 0.1 % Apply 1 application topically 4 (four) times daily.    . vitamin C (ASCORBIC ACID) 500 MG tablet Take 1,000 mg by mouth daily.     Marland Kitchen zolpidem (AMBIEN) 10 MG tablet Take 1 tablet (10 mg total) by mouth at  bedtime as needed for sleep. 30 tablet 2   No current facility-administered medications for this visit.      Musculoskeletal: Strength & Muscle Tone: within normal limits Gait & Station: normal Patient leans: N/A  Psychiatric Specialty Exam: Review of Systems  Musculoskeletal: Positive for back pain and myalgias.  Neurological: Positive for tingling.  Psychiatric/Behavioral: The patient is nervous/anxious.   All other systems reviewed and are negative.   Blood pressure 127/78, pulse (!) 101, height 5\' 10"  (1.778 m), weight 260 lb (117.9 kg), SpO2 95 %.Body mass index is 37.31 kg/m.  General Appearance: Casual and Fairly Groomed  Eye Contact:  Good  Speech:  Clear and Coherent and Pressured  Volume:  Increased  Mood:  Anxious and Irritable  Affect:  Labile  Thought Process:  Goal Directed  Orientation:  Full (Time, Place, and Person)  Thought Content: Rumination   Suicidal Thoughts:  No  Homicidal Thoughts:  No  Memory:  Immediate;   Good Recent;   Good Remote;   Good  Judgement:  Fair  Insight:  Lacking  Psychomotor Activity:  Normal  Concentration:  Concentration: Good and Attention Span: Good  Recall:  Good  Fund of Knowledge: Good  Language: Good  Akathisia:  No  Handed:  Right  AIMS (if indicated): not done  Assets:  Communication Skills Desire for Improvement Resilience Social Support Talents/Skills  ADL's:  Intact  Cognition: WNL  Sleep:  Fair   Screenings: PHQ2-9     Nutrition from 09/22/2014 in [facility]  PHQ-2 Total Score  4  PHQ-9 Total Score  15       Assessment and Plan: This patient is a  52 year old female with a history of ADHD and bipolar disorder.  She is very upset today because of the conflict with her daughters.  I encouraged her to give it time and it may settle out.  The daughters are unwilling to talk through the problems.  She will continue Adderall 30 mg twice daily for ADHD, Cymbalta 2 mg twice  daily for depression and Ambien 10 mg at bedtime for sleep.  She gets Xanax 1 mg twice daily from another physician.  She will return to see me in 2 months   [name], MD 07/28/2018, 11:56 AM

## 2018-08-07 ENCOUNTER — Other Ambulatory Visit: Payer: Self-pay

## 2018-08-07 ENCOUNTER — Encounter: Payer: Self-pay | Admitting: *Deleted

## 2018-08-07 ENCOUNTER — Other Ambulatory Visit: Payer: Self-pay | Admitting: *Deleted

## 2018-08-07 ENCOUNTER — Ambulatory Visit (INDEPENDENT_AMBULATORY_CARE_PROVIDER_SITE_OTHER): Payer: Medicaid Other | Admitting: Vascular Surgery

## 2018-08-07 ENCOUNTER — Encounter: Payer: Self-pay | Admitting: Vascular Surgery

## 2018-08-07 VITALS — BP 144/82 | HR 101 | Temp 98.7°F | Resp 16 | Ht 70.0 in | Wt 261.0 lb

## 2018-08-07 DIAGNOSIS — I872 Venous insufficiency (chronic) (peripheral): Secondary | ICD-10-CM | POA: Diagnosis not present

## 2018-08-07 NOTE — Progress Notes (Signed)
Vitals:   08/07/18 1007  BP: (!) 149/82  Pulse: 99  Resp: 16  Temp: 98.7 F (37.1 C)  TempSrc: Oral  SpO2: 95%  Weight: 261 lb (118.4 kg)  Height: 5\' 10"  (1.778 m)

## 2018-08-07 NOTE — Progress Notes (Signed)
Patient ID: Jane [name], female   DOB: 11-03-1966, 52 y.o.   MRN: [id number]  Reason for Consult: Venous Insufficiency   Referred by Sinda Du, MD  Subjective:     HPI:  Jane [name] is a 52 y.o. female with history of left lower extremity DVT she states was in 2014.  She was maintained on anticoagulant agent for 1 year included pills and injections.  Since that time she is had persistent swelling of her left lower extremity.  She is undergone multiple test in the past.  She is never had any procedures on her left lower extremity did have a foot surgery in the past.  Does not have any arterial disease.  States she has heaviness in the leg tends to wear compression stockings but states that they are not helpful because it swells immediately when taking it off.  She also has major depression and this is 1 of the leading issues for this.  She currently has 7 grandchildren which she wishes she could be more active with.  She has previously had a rash on her left lower extremity but this is currently not active.  Currently not taking any blood thinners and also not taking any antiplatelet agents.  She states that she does have some renal insufficiency.  Past Medical History:  Diagnosis Date  . Anxiety   . Bipolar disorder ([place])   . Depression   . GERD (gastroesophageal reflux disease)   . High cholesterol   . History of DVT (deep vein thrombosis)    left leg  . Hypertension    states under control with meds., has been on med. x 2 years  . Hypothyroidism   . Obsessive-compulsive disorder   . Restless leg syndrome   . Trigger thumb of left hand 01/2018   Family History  Problem Relation Age of Onset  . Heart disease Mother   . Hyperlipidemia Mother   . Hypertension Mother   . Bipolar disorder Mother   . Diabetes Father   . Heart disease Father   . Hyperlipidemia Father   . Hypertension Father   . Drug abuse Daughter   . ADD / ADHD Daughter   . Drug abuse Daughter   .  Anxiety disorder Daughter   . Bipolar disorder Daughter   . Hypertension Sister   . Hypertension Brother   . Hyperlipidemia Brother   . Heart disease Brother   . Bipolar disorder Maternal Aunt   . Suicidality Maternal Aunt    Past Surgical History:  Procedure Laterality Date  . ABDOMINAL HYSTERECTOMY  06/2016   complete  . CHOLECYSTECTOMY    . LUMBAR FUSION  11/21/2000   L5-S1  . LUMBAR SPINE SURGERY     x 2 others  . TRIGGER FINGER RELEASE Right 12/01/2017   Procedure: RELEASE TRIGGER FINGER/A-1 PULLEY RIGHT THUMB;  Surgeon: Leanora Cover, MD;  Location: [facility];  Service: Orthopedics;  Laterality: Right;  . TRIGGER FINGER RELEASE Left 01/26/2018   Procedure: LEFT TRIGGER THUMB RELEASE;  Surgeon: Leanora Cover, MD;  Location: [facility];  Service: Orthopedics;  Laterality: Left;    Short Social History:  Social History   Tobacco Use  . Smoking status: Never Smoker  . Smokeless tobacco: Never Used  Substance Use Topics  . Alcohol use: No    Allergies  Allergen Reactions  . Aripiprazole Other (See Comments)    BECOMES VIOLENT Very violent Very violent Very violent   . Chlorpromazine Other (See Comments)  SEVERE ANXIETY Shakes, anxiety off the roof and panic attack Shakes, anxiety off the roof and panic attack Shakes, anxiety off the roof and panic attack Shakes, anxiety off the roof and panic attack Shakes, anxiety off the roof and panic attack   . Gabapentin Other (See Comments)    NIGHTMARES Nightmares and talking to her nightmares Nightmares and talking to her nightmares Nightmares and talking to her nightmares Nightmares and talking to her nightmares Nightmares and talking to her nightmares   . Seroquel [Quetiapine Fumarate] Other (See Comments)    BECOMES VIOLENT  . Quetiapine Fumarate Other (See Comments)    Current Outpatient Medications  Medication Sig Dispense Refill  . ALPRAZolam (XANAX) 1 MG tablet Take 1 tablet  (1 mg total) by mouth 2 (two) times daily. 60 tablet 2  . amLODipine (NORVASC) 10 MG tablet Take 10 mg by mouth every morning.    Marland Kitchen amphetamine-dextroamphetamine (ADDERALL) 30 MG tablet Take 1 tablet by mouth 2 (two) times daily. 60 tablet 0  . atorvastatin (LIPITOR) 20 MG tablet Take 20 mg by mouth daily.    . cetirizine (ZYRTEC) 10 MG tablet Take 10 mg by mouth daily.    . cholecalciferol (VITAMIN D) 1000 units tablet Take 2,000 Units by mouth daily.    . cyclobenzaprine (FLEXERIL) 10 MG tablet Take 10 mg by mouth daily as needed for muscle spasms.    . DULoxetine (CYMBALTA) 60 MG capsule Take 1 capsule (60 mg total) by mouth 2 (two) times daily. 60 capsule 2  . hydrochlorothiazide (HYDRODIURIL) 25 MG tablet Take 25 mg by mouth daily.    Marland Kitchen HYDROcodone-acetaminophen (NORCO) 5-325 MG tablet 1-2 tabs po q6 hours prn pain 20 tablet 0  . levothyroxine (SYNTHROID, LEVOTHROID) 137 MCG tablet Take 137 mcg by mouth daily before breakfast.    . linaclotide (LINZESS) 145 MCG CAPS capsule Take by mouth.    . Magnesium 250 MG TABS Take 250 mg by mouth daily.    . Melatonin 5 MG TABS Take 10 mg by mouth at bedtime.    . Multiple Vitamin (MULTIVITAMIN) tablet Take 1 tablet by mouth daily.    . Omega-3 Fatty Acids (FISH OIL OMEGA-3 PO) Take by mouth.    . pantoprazole (PROTONIX) 40 MG tablet Take 40 mg by mouth daily.    . potassium chloride (K-DUR) 10 MEQ tablet Take 10 mEq by mouth 3 (three) times daily.    Marland Kitchen rOPINIRole (REQUIP) 2 MG tablet Take 2 mg by mouth 2 (two) times daily.    Marland Kitchen terbinafine (LAMISIL) 250 MG tablet Take 250 mg by mouth daily.    . vitamin C (ASCORBIC ACID) 500 MG tablet Take 1,000 mg by mouth daily.     Marland Kitchen zolpidem (AMBIEN) 10 MG tablet Take 1 tablet (10 mg total) by mouth at bedtime as needed for sleep. 30 tablet 2  . amphetamine-dextroamphetamine (ADDERALL) 30 MG tablet Take 1 tablet by mouth 2 (two) times daily. (Patient not taking: Reported on 08/07/2018) 60 tablet 0  .  cycloSPORINE (RESTASIS) 0.05 % ophthalmic emulsion Place 1 drop into both eyes 2 (two) times daily.    Marland Kitchen ketoconazole (NIZORAL) 2 % cream Apply 1 application topically 2 (two) times daily.    Marland Kitchen nystatin (MYCOSTATIN/NYSTOP) powder Apply 100,000 g topically 2 (two) times daily.    Marland Kitchen triamcinolone cream (KENALOG) 0.1 % Apply 1 application topically 4 (four) times daily.     No current facility-administered medications for this visit.     Review of  Systems  Constitutional:  Constitutional negative. HENT: HENT negative.  Eyes: Eyes negative.  Respiratory: Respiratory negative.  Cardiovascular: Positive for leg swelling.  GI: Gastrointestinal negative.  Musculoskeletal: Positive for leg pain.  Neurological: Neurological negative. Hematologic: Hematologic/lymphatic negative.  Psychiatric: Positive for depressed mood.        Objective:  Objective   Vitals:   08/07/18 1007 08/07/18 1012  BP: (!) 149/82 (!) 144/82  Pulse: 99 (!) 101  Resp: 16   Temp: 98.7 F (37.1 C)   TempSrc: Oral   SpO2: 95%   Weight: 261 lb (118.4 kg)   Height: 5\' 10"  (1.778 m)    Body mass index is 37.45 kg/m.  Physical Exam  Constitutional: She is oriented to person, place, and time. She appears well-developed.  HENT:  Head: Normocephalic.  Eyes: Pupils are equal, round, and reactive to light.  Neck: Normal range of motion.  Cardiovascular: Normal rate.  Pulmonary/Chest: Effort normal.  Abdominal: Soft. She exhibits no mass.  Musculoskeletal: She exhibits edema.  Neurological: She is alert and oriented to person, place, and time.  Skin: Skin is warm and dry.  Psychiatric: She has a normal mood and affect. Her behavior is normal. Judgment normal.    Data: She does not have any new studies today but we reviewed all of her old studies together which demonstrated some reflux in her left greater saphenous vein at the junction up to 0.6 cm in the past.  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  I discussed that we will start from a left femoral vein approach possibly require right IJ approach and possibly given the long-standing nature of his occlusion that we would not be able to improve her symptoms or get across the occlusive disease.  She does have some level of chronic kidney disease with last creatinine 1.2 and she states she has been diagnosed with chronic kidney disease stage III.  From the standpoint we will need to limit her contrast and also watch her dosing of anti-inflammatory medications in the perioperative period.  We will plan to admit her postoperatively.  I have asked her to begin taking aspirin and if we do intervene she will require Plavix at least for a short period following surgery.  She demonstrates good understanding.     Waynetta Sandy MD [facility]

## 2018-08-10 ENCOUNTER — Telehealth (HOSPITAL_COMMUNITY): Payer: Self-pay | Admitting: *Deleted

## 2018-08-10 ENCOUNTER — Ambulatory Visit (HOSPITAL_COMMUNITY): Payer: Medicaid Other | Admitting: Psychiatry

## 2018-08-10 NOTE — Telephone Encounter (Signed)
She will need to come in 

## 2018-08-10 NOTE — Telephone Encounter (Signed)
SPOKE with patient & Per Provider : She will need to come in. I offered  A 3:30 appointment for today  08/10/18

## 2018-08-10 NOTE — Telephone Encounter (Signed)
Dr Harrington Challenger Patient called Requesting to speak with you. When informed we're in the middle of [facility] she got upset & asked well can I get her to call me back that's all I'm asking. I said I will be pass the message along if you could please let me know what's going on? She stated she's very depressed, crying, lost interest in everything. " I think I need to be put on something else" Just Let Dr Harrington Challenger Know She Understands # [phone]"

## 2018-08-17 ENCOUNTER — Telehealth: Payer: Self-pay | Admitting: Vascular Surgery

## 2018-08-17 ENCOUNTER — Encounter (HOSPITAL_COMMUNITY): Payer: Self-pay | Admitting: Vascular Surgery

## 2018-08-17 ENCOUNTER — Other Ambulatory Visit: Payer: Self-pay

## 2018-08-17 ENCOUNTER — Encounter (HOSPITAL_COMMUNITY)
Admission: RE | Disposition: A | Discharge status: Home / Self Care | Payer: Self-pay | Source: Ambulatory Visit | Attending: Vascular Surgery

## 2018-08-17 ENCOUNTER — Ambulatory Visit (HOSPITAL_COMMUNITY)
Admission: RE | Admit: 2018-08-17 | Discharge: 2018-08-17 | Disposition: A | Discharge status: Home / Self Care | Payer: Medicaid Other | Source: Ambulatory Visit | Attending: Vascular Surgery | Admitting: Vascular Surgery

## 2018-08-17 DIAGNOSIS — Z86718 Personal history of other venous thrombosis and embolism: Secondary | ICD-10-CM | POA: Insufficient documentation

## 2018-08-17 DIAGNOSIS — I871 Compression of vein: Secondary | ICD-10-CM | POA: Insufficient documentation

## 2018-08-17 HISTORY — PX: LOWER EXTREMITY VENOGRAPHY: CATH118253

## 2018-08-17 LAB — POCT I-STAT, CHEM 8
BUN: 31 mg/dL — ABNORMAL HIGH (ref 6–20)
CHLORIDE: 101 mmol/L (ref 98–111)
CREATININE: 1.4 mg/dL — AB (ref 0.44–1.00)
Calcium, Ion: 1.12 mmol/L — ABNORMAL LOW (ref 1.15–1.40)
Glucose, Bld: 141 mg/dL — ABNORMAL HIGH (ref 70–99)
HEMATOCRIT: 41 % (ref 36.0–46.0)
HEMOGLOBIN: 13.9 g/dL (ref 12.0–15.0)
Potassium: 3.3 mmol/L — ABNORMAL LOW (ref 3.5–5.1)
Sodium: 142 mmol/L (ref 135–145)
TCO2: 31 mmol/L (ref 22–32)

## 2018-08-17 SURGERY — LOWER EXTREMITY VENOGRAPHY
Anesthesia: LOCAL

## 2018-08-17 MED ORDER — ONDANSETRON HCL 4 MG/2ML IJ SOLN: 4.0000 mg | Freq: Four times a day (QID) | INTRAMUSCULAR | Status: DC | PRN

## 2018-08-17 MED ORDER — MIDAZOLAM HCL 2 MG/2ML IJ SOLN
INTRAMUSCULAR | Status: DC | PRN
  Administered 2018-08-17 (×2): 1 mg via INTRAVENOUS

## 2018-08-17 MED ORDER — LIDOCAINE HCL (PF) 1 % IJ SOLN
INTRAMUSCULAR | Status: DC | PRN
  Administered 2018-08-17: 10 mL

## 2018-08-17 MED ORDER — LABETALOL HCL 5 MG/ML IV SOLN: 10.0000 mg | INTRAVENOUS | Status: DC | PRN

## 2018-08-17 MED ORDER — SODIUM CHLORIDE 0.9% FLUSH: 3.0000 mL | INTRAVENOUS | Status: DC | PRN

## 2018-08-17 MED ORDER — LIDOCAINE HCL (PF) 1 % IJ SOLN
INTRAMUSCULAR | Status: AC
  Filled 2018-08-17: qty 30

## 2018-08-17 MED ORDER — HYDRALAZINE HCL 20 MG/ML IJ SOLN: 5.0000 mg | INTRAMUSCULAR | Status: DC | PRN

## 2018-08-17 MED ORDER — HEPARIN (PORCINE) IN NACL 1000-0.9 UT/500ML-% IV SOLN
INTRAVENOUS | Status: AC
  Filled 2018-08-17: qty 500

## 2018-08-17 MED ORDER — HEPARIN (PORCINE) IN NACL 1000-0.9 UT/500ML-% IV SOLN
INTRAVENOUS | Status: DC | PRN
  Administered 2018-08-17: 500 mL

## 2018-08-17 MED ORDER — SODIUM CHLORIDE 0.9% FLUSH: 3.0000 mL | Freq: Two times a day (BID) | INTRAVENOUS | Status: DC

## 2018-08-17 MED ORDER — FENTANYL CITRATE (PF) 100 MCG/2ML IJ SOLN
INTRAMUSCULAR | Status: DC | PRN
  Administered 2018-08-17 (×2): 50 ug via INTRAVENOUS

## 2018-08-17 MED ORDER — FENTANYL CITRATE (PF) 100 MCG/2ML IJ SOLN
INTRAMUSCULAR | Status: AC
  Filled 2018-08-17: qty 2

## 2018-08-17 MED ORDER — MORPHINE SULFATE (PF) 10 MG/ML IV SOLN: 2.0000 mg | INTRAVENOUS | Status: DC | PRN

## 2018-08-17 MED ORDER — LIDOCAINE HCL (PF) 1 % IJ SOLN
INTRAMUSCULAR | Status: DC | PRN
  Administered 2018-08-17: 15 mL

## 2018-08-17 MED ORDER — SODIUM CHLORIDE 0.9 % IV SOLN: 250.0000 mL | INTRAVENOUS | Status: DC | PRN

## 2018-08-17 MED ORDER — ACETAMINOPHEN 325 MG PO TABS: 650.0000 mg | ORAL_TABLET | ORAL | Status: DC | PRN

## 2018-08-17 MED ORDER — OXYCODONE HCL 5 MG PO TABS: 5.0000 mg | ORAL_TABLET | ORAL | Status: DC | PRN

## 2018-08-17 MED ORDER — MIDAZOLAM HCL 2 MG/2ML IJ SOLN
INTRAMUSCULAR | Status: AC
  Filled 2018-08-17: qty 2

## 2018-08-17 MED ORDER — SODIUM CHLORIDE 0.9 % WEIGHT BASED INFUSION: 1.0000 mL/kg/h | INTRAVENOUS | Status: DC

## 2018-08-17 MED ORDER — LIDOCAINE HCL (PF) 1 % IJ SOLN
INTRAMUSCULAR | Status: DC | PRN
  Administered 2018-08-17: 20 mL
  Administered 2018-08-17: 18 mL

## 2018-08-17 SURGICAL SUPPLY — 9 items
KIT MICROPUNCTURE NIT STIFF (SHEATH) ×2 IMPLANT
KIT PV (KITS) ×2 IMPLANT
SHEATH PINNACLE 5F 10CM (SHEATH) ×2 IMPLANT
SHEATH PROBE COVER 6X72 (BAG) ×2 IMPLANT
SYR MEDRAD MARK V 150ML (SYRINGE) ×2 IMPLANT
TRANSDUCER W/STOPCOCK (MISCELLANEOUS) ×2 IMPLANT
TRAY PV CATH (CUSTOM PROCEDURE TRAY) ×2 IMPLANT
WIRE BENTSON .035X145CM (WIRE) ×4 IMPLANT
WIRE TORQFLEX AUST .018X40CM (WIRE) ×6 IMPLANT

## 2018-08-17 NOTE — Telephone Encounter (Signed)
sch appt lvm 09-25-18 10am IVC/iliac 1045am f/u MD

## 2018-08-17 NOTE — H&P (Signed)
   History and Physical Update  The patient was interviewed and re-examined.  The patient's previous History and Physical has been reviewed and is unchanged from recent office visit. Plan for left lower extremity ascending venography with possible intervention.  Dmonte Maher C. Donzetta Matters, MD [facility] Office: [phone] Pager: [phone]   08/17/2018, 7:31 AM

## 2018-08-17 NOTE — Discharge Instructions (Signed)
Venogram, Care After °This sheet gives you information about how to care for yourself after your procedure. Your health care provider may also give you more specific instructions. If you have problems or questions, contact your health care provider. °What can I expect after the procedure? °After the procedure, it is common to have: °· Bruising or mild discomfort in the area where the IV was inserted (insertion site). ° °Follow these instructions at home: °Eating and drinking °· Follow instructions from your health care provider about eating or drinking restrictions. °· Drink a lot of fluids for the first several days after the procedure, as directed by your health care provider. This helps to wash (flush) the contrast out of your body. Examples of healthy fluids include water or low-calorie drinks. °General instructions °· Check your IV insertion area every day for signs of infection. Check for: °? Redness, swelling, or pain. °? Fluid or blood. °? Warmth. °? Pus or a bad smell. °· Take over-the-counter and prescription medicines only as told by your health care provider. °· Rest and return to your normal activities as told by your health care provider. Ask your health care provider what activities are safe for you. °· Do not drive for 24 hours if you were given a medicine to help you relax (sedative), or until your health care provider approves. °· Keep all follow-up visits as told by your health care provider. This is important. °Contact a health care provider if: °· Your skin becomes itchy or you develop a rash or hives. °· You have a fever that does not get better with medicine. °· You feel nauseous. °· You vomit. °· You have redness, swelling, or pain around the insertion site. °· You have fluid or blood coming from the insertion site. °· Your insertion area feels warm to the touch. °· You have pus or a bad smell coming from the insertion site. °Get help right away if: °· You have difficulty breathing or  shortness of breath. °· You develop chest pain. °· You faint. °· You feel very dizzy. °These symptoms may represent a serious problem that is an emergency. Do not wait to see if the symptoms will go away. Get medical help right away. Call your local emergency services (911 in the [place] Do not drive yourself to the hospital. °Summary °· After your procedure, it is common to have bruising or mild discomfort in the area where the IV was inserted. °· You should check your IV insertion area every day for signs of infection. °· Take over-the-counter and prescription medicines only as told by your health care provider. °· You should drink a lot of fluids for the first several days after the procedure to help flush the contrast from your body. °This information is not intended to replace advice given to you by your health care provider. Make sure you discuss any questions you have with your health care provider. °Document Released: 08/11/2013 Document Revised: 09/14/2016 Document Reviewed: 09/14/2016 °Elsevier Interactive Patient Education © 2017 [employer] ° °

## 2018-08-17 NOTE — Progress Notes (Signed)
Dr Donzetta Matters aware of pt's lab results

## 2018-08-17 NOTE — Op Note (Signed)
    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 Donzetta Matters, MD Procedure Performed: 1.  US guided cannulation of left femoral vein 2.  Left lower extremity ascending venogram 3.  Moderate sedation with fentanyl and versed for 32 minutes  Indications: 52 year old female has a history of left lower extremity DVT.  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  She is now indicated for venogram with possible intervention.  Findings: I difficulty accessing her left common femoral vein was able to get into her left femoral vein in the mid thigh.  Venogram demonstrated occluded proximal left common femoral vein with reflux into her saphenous vein although I could not identify profundal vein.  She does have crossover appears to drain easily through her right common iliac vein into the IVC.  We will have her follow-up with saphenous vein mapping and central vein evaluation to make sure she is open in the right external and common iliac veins and the IVC prior to considering palma procedure.   Procedure:  The patient was identified in the holding area and taken to room 8.  The patient was then placed supine on the table and prepped and draped in the usual sterile fashion.  A time out was called.  Ultrasound was used to evaluate the common femoral vein on the left.  I then cannulated this with micropuncture needle.  Unfortunately wire would not pass.  I then identified the saphenous vein in the left attempted to cannulate this but could not again get the wire to pass.  I was then able to cannulate the femoral vein with direct ultrasound guidance.  The vein was noted to be compressible in the mid thigh and a Bentson wire passed.  I then placed a 5 French sheath.  A sending left venography demonstrated the above findings.  There was no role for intervention.  I  elected to remove the wire and sheath.  Patient will be considered for palma procedure.  Contrast: 25cc    Aksh Swart C. Donzetta Matters, MD [facility] Office: [phone] Pager: [phone]

## 2018-08-18 ENCOUNTER — Ambulatory Visit (HOSPITAL_COMMUNITY): Payer: Self-pay | Admitting: Licensed Clinical Social Worker

## 2018-09-10 ENCOUNTER — Other Ambulatory Visit: Payer: Self-pay

## 2018-09-10 DIAGNOSIS — M7989 Other specified soft tissue disorders: Secondary | ICD-10-CM

## 2018-09-10 DIAGNOSIS — I871 Compression of vein: Secondary | ICD-10-CM

## 2018-09-10 DIAGNOSIS — I872 Venous insufficiency (chronic) (peripheral): Secondary | ICD-10-CM

## 2018-09-11 ENCOUNTER — Other Ambulatory Visit: Payer: Self-pay

## 2018-09-17 ENCOUNTER — Encounter (HOSPITAL_COMMUNITY): Payer: Self-pay | Admitting: Licensed Clinical Social Worker

## 2018-09-17 ENCOUNTER — Ambulatory Visit (INDEPENDENT_AMBULATORY_CARE_PROVIDER_SITE_OTHER): Payer: Medicaid Other | Admitting: Licensed Clinical Social Worker

## 2018-09-17 DIAGNOSIS — F3130 Bipolar disorder, current episode depressed, mild or moderate severity, unspecified: Secondary | ICD-10-CM | POA: Diagnosis not present

## 2018-09-17 NOTE — Progress Notes (Signed)
   THERAPIST PROGRESS NOTE  Session Time: 9:55 am-10:35 am  Participation Level: Active  Behavioral Response: NeatAlertEuthymic  Type of Therapy: Individual Therapy  Treatment Goals addressed: Coping  Interventions: CBT and Solution Focused  Summary: Jane [name] is a 53 y.o. female who  presents oriented x5 (person, place, situation, time and object), alert, and depressed, and cooperative to address depression and anxiety. Patient has a history of medical including chronic back pain and mental health treatment including outpatient therapy and medication managment. She admits to symptoms of mania and notes that she experiences manic episodes in the winter time. Patient denies suicidal and homicidal ideations. She denies psychosis including auditory and visual hallucinations. Patient denies substance use. Patient is at no risk for lethality at this time.  Physically: Patient fell again and hurt his knee.   Spiritually/values: No issues identified.  Relationships: Patient is very angry with her siblings and daughters. She is also very angry with her boyfriend. She is planning on cutting everyone off due to feeling like all her relationships are one sided.  Emotional/Mental/Behavior: Patient feels angry. She is upset to the point where she is starting to hate her siblings and daughters. She feels like they yell at her and make no effort to maintain their relationships. She is accepting that she may miss out on time with her grandchildren because her daughters use them as a way to punish her.   Patient engaged in session. She responded well to interventions. She continues to meet the criteria for Bipolar I disorder, most recent episode depressed. Patient will continue in outpatient therapy due to being the least restrictive service to meet her needs. Patient made moderate progress on her goals at this time.   Suicidal/Homicidal: Nowithout intent/plan  Therapist Response:  Therapist reviewed  patient's recent thoughts and behaviors. Therapist utilized CBT to address mood. Therapist reviewed patient's goals. Therapist processed patient's feelings to identify triggers for mood. Therapist discussed her relationships and how anger is impacting her relationships.   Plan: Return again in 2-3 weeks.   Diagnosis: Axis I: Bipolar, Depressed    Axis II: No diagnosis    Glori Bickers, LCSW 09/17/2018

## 2018-09-18 ENCOUNTER — Other Ambulatory Visit (HOSPITAL_COMMUNITY): Payer: Self-pay | Admitting: Pulmonary Disease

## 2018-09-18 ENCOUNTER — Ambulatory Visit (HOSPITAL_COMMUNITY)
Admission: RE | Admit: 2018-09-18 | Discharge: 2018-09-18 | Disposition: A | Discharge status: Home / Self Care | Payer: Medicaid Other | Source: Ambulatory Visit | Attending: Pulmonary Disease | Admitting: Pulmonary Disease

## 2018-09-18 DIAGNOSIS — R2242 Localized swelling, mass and lump, left lower limb: Secondary | ICD-10-CM

## 2018-09-18 DIAGNOSIS — M7989 Other specified soft tissue disorders: Secondary | ICD-10-CM | POA: Diagnosis not present

## 2018-09-23 ENCOUNTER — Ambulatory Visit (HOSPITAL_COMMUNITY)
Admission: RE | Admit: 2018-09-23 | Discharge: 2018-09-23 | Disposition: A | Discharge status: Home / Self Care | Payer: Medicaid Other | Source: Ambulatory Visit | Attending: Pulmonary Disease | Admitting: Pulmonary Disease

## 2018-09-23 DIAGNOSIS — I872 Venous insufficiency (chronic) (peripheral): Secondary | ICD-10-CM | POA: Diagnosis present

## 2018-09-23 DIAGNOSIS — I871 Compression of vein: Secondary | ICD-10-CM | POA: Insufficient documentation

## 2018-09-23 DIAGNOSIS — M7989 Other specified soft tissue disorders: Secondary | ICD-10-CM | POA: Diagnosis not present

## 2018-09-25 ENCOUNTER — Encounter: Payer: Self-pay | Admitting: Vascular Surgery

## 2018-09-25 ENCOUNTER — Other Ambulatory Visit: Payer: Self-pay

## 2018-09-25 ENCOUNTER — Ambulatory Visit (HOSPITAL_COMMUNITY)
Admission: RE | Admit: 2018-09-25 | Discharge: 2018-09-25 | Disposition: A | Discharge status: Home / Self Care | Payer: Medicaid Other | Source: Ambulatory Visit | Attending: Vascular Surgery | Admitting: Vascular Surgery

## 2018-09-25 ENCOUNTER — Ambulatory Visit (INDEPENDENT_AMBULATORY_CARE_PROVIDER_SITE_OTHER): Payer: Medicaid Other | Admitting: Vascular Surgery

## 2018-09-25 VITALS — BP 147/95 | HR 81 | Temp 97.4°F | Resp 14 | Ht 70.0 in | Wt 260.0 lb

## 2018-09-25 DIAGNOSIS — M7989 Other specified soft tissue disorders: Secondary | ICD-10-CM | POA: Insufficient documentation

## 2018-09-25 DIAGNOSIS — I87002 Postthrombotic syndrome without complications of left lower extremity: Secondary | ICD-10-CM

## 2018-09-25 DIAGNOSIS — I872 Venous insufficiency (chronic) (peripheral): Secondary | ICD-10-CM | POA: Insufficient documentation

## 2018-09-25 DIAGNOSIS — I871 Compression of vein: Secondary | ICD-10-CM

## 2018-09-25 NOTE — Progress Notes (Signed)
Patient ID: Jane [name], female   DOB: Nov 03, 1966, 52 y.o.   MRN: [id number]  Reason for Consult: Venous Insufficiency   Referred by Sinda Du, MD  Subjective:     HPI:  Jane [name] is a 52 y.o. female history of DVT recently underwent venogram demonstrated occluded left common and external iliac veins.  She continues to have swelling of her left lower extremity last Villalta score 16.  Unfortunately she recently had a fall as an injury to the left leg is now wearing a brace and cannot wear her compression stockings.  She is also wearing a right wrist brace.  She is not on anticoagulation she does take aspirin.  Leg swelling does limit her activity even prior to injury.  Past Medical History:  Diagnosis Date  . Anxiety   . Bipolar disorder ([place])   . Depression   . GERD (gastroesophageal reflux disease)   . High cholesterol   . History of DVT (deep vein thrombosis)    left leg  . Hypertension    states under control with meds., has been on med. x 2 years  . Hypothyroidism   . Obsessive-compulsive disorder   . Restless leg syndrome   . Trigger thumb of left hand 01/2018   Family History  Problem Relation Age of Onset  . Heart disease Mother   . Hyperlipidemia Mother   . Hypertension Mother   . Bipolar disorder Mother   . Diabetes Father   . Heart disease Father   . Hyperlipidemia Father   . Hypertension Father   . Drug abuse Daughter   . ADD / ADHD Daughter   . Drug abuse Daughter   . Anxiety disorder Daughter   . Bipolar disorder Daughter   . Hypertension Sister   . Hypertension Brother   . Hyperlipidemia Brother   . Heart disease Brother   . Bipolar disorder Maternal Aunt   . Suicidality Maternal Aunt    Past Surgical History:  Procedure Laterality Date  . ABDOMINAL HYSTERECTOMY  06/2016   complete  . CHOLECYSTECTOMY    . LOWER EXTREMITY VENOGRAPHY N/A 08/17/2018   Procedure: LOWER EXTREMITY VENOGRAPHY - Central Venogram;  Surgeon: Waynetta Sandy, MD;  Location: [facility];  Service: Cardiovascular;  Laterality: N/A;  . LUMBAR FUSION  11/21/2000   L5-S1  . LUMBAR SPINE SURGERY     x 2 others  . TRIGGER FINGER RELEASE Right 12/01/2017   Procedure: RELEASE TRIGGER FINGER/A-1 PULLEY RIGHT THUMB;  Surgeon: Leanora Cover, MD;  Location: [facility];  Service: Orthopedics;  Laterality: Right;  . TRIGGER FINGER RELEASE Left 01/26/2018   Procedure: LEFT TRIGGER THUMB RELEASE;  Surgeon: Leanora Cover, MD;  Location: [facility];  Service: Orthopedics;  Laterality: Left;    Short Social History:  Social History   Tobacco Use  . Smoking status: Never Smoker  . Smokeless tobacco: Never Used  Substance Use Topics  . Alcohol use: No    Allergies  Allergen Reactions  . Aripiprazole Other (See Comments)    BECOMES VIOLENT Very violent Very violent Very violent   . Chlorpromazine Other (See Comments)    SEVERE ANXIETY Shakes, anxiety off the roof and panic attack Shakes, anxiety off the roof and panic attack Shakes, anxiety off the roof and panic attack Shakes, anxiety off the roof and panic attack Shakes, anxiety off the roof and panic attack   . Gabapentin Other (See Comments)    NIGHTMARES Nightmares and talking  to her nightmares Nightmares and talking to her nightmares Nightmares and talking to her nightmares Nightmares and talking to her nightmares Nightmares and talking to her nightmares   . Seroquel [Quetiapine Fumarate] Other (See Comments)    BECOMES VIOLENT    Current Outpatient Medications  Medication Sig Dispense Refill  . ALPRAZolam (XANAX) 1 MG tablet Take 1 tablet (1 mg total) by mouth 2 (two) times daily. (Patient taking differently: Take 1 mg by mouth 2 (two) times daily as needed for anxiety. ) 60 tablet 2  . amLODipine (NORVASC) 10 MG tablet Take 10 mg by mouth daily.     Marland Kitchen amphetamine-dextroamphetamine (ADDERALL) 30 MG tablet Take 1 tablet by mouth 2 (two)  times daily. 60 tablet 0  . aspirin EC 81 MG tablet Take 81 mg by mouth daily.    Marland Kitchen aspirin-acetaminophen-caffeine (EXCEDRIN MIGRAINE) 250-250-65 MG tablet Take 2 tablets by mouth every 6 (six) hours as needed for headache or migraine.    Marland Kitchen atorvastatin (LIPITOR) 20 MG tablet Take 20 mg by mouth daily.    . carboxymethylcellul-glycerin (OPTIVE) 0.5-0.9 % ophthalmic solution Place 1 drop into both eyes 4 (four) times daily.    . cetirizine (ZYRTEC) 10 MG tablet Take 10 mg by mouth daily.    . cholecalciferol (VITAMIN D) 1000 units tablet Take 1,000 Units by mouth every evening.     . cyclobenzaprine (FLEXERIL) 10 MG tablet Take 10 mg by mouth at bedtime.     . cycloSPORINE (RESTASIS) 0.05 % ophthalmic emulsion Place 1 drop into both eyes 2 (two) times daily.    . diclofenac sodium (VOLTAREN) 1 % GEL Apply 2-4 g topically 2 (two) times daily as needed (for foot pain.).    Marland Kitchen DULoxetine (CYMBALTA) 60 MG capsule Take 1 capsule (60 mg total) by mouth 2 (two) times daily. 60 capsule 2  . hydrochlorothiazide (HYDRODIURIL) 25 MG tablet Take 25 mg by mouth daily.    Marland Kitchen HYDROcodone-acetaminophen (NORCO) 10-325 MG tablet Take 1 tablet by mouth every 6 (six) hours as needed (for pain.).    Marland Kitchen ketoconazole (NIZORAL) 2 % cream Apply 1 application topically 2 (two) times daily as needed for irritation.     Marland Kitchen levothyroxine (SYNTHROID, LEVOTHROID) 137 MCG tablet Take 137 mcg by mouth daily before breakfast.    . linaclotide (LINZESS) 145 MCG CAPS capsule Take 145 mcg by mouth daily.     . Magnesium 250 MG TABS Take 250 mg by mouth daily.    . Melatonin 10 MG TBDP Take 10 mg by mouth at bedtime as needed (for sleep).    . Multiple Vitamin (MULTIVITAMIN WITH MINERALS) TABS tablet Take 1 tablet by mouth daily. One-A-Day Women's Multivitamin    . nystatin (MYCOSTATIN/NYSTOP) powder Apply 1 g topically 2 (two) times daily as needed (for skin irritation/rash).     . Omega-3 Fatty Acids (FISH OIL OMEGA-3 PO) Take 500 mg by  mouth every evening.     . pantoprazole (PROTONIX) 40 MG tablet Take 40 mg by mouth daily.    Marland Kitchen PAZEO 0.7 % SOLN Place 1 drop into both eyes daily.    . potassium chloride (K-DUR) 10 MEQ tablet Take 10 mEq by mouth 3 (three) times daily.    . prednisoLONE acetate (PRED FORTE) 1 % ophthalmic suspension Place 1 drop into both eyes 4 (four) times daily.    Marland Kitchen rOPINIRole (REQUIP) 2 MG tablet Take 2 mg by mouth 2 (two) times daily.    Marland Kitchen XIIDRA 5 % SOLN  Place 1 drop into both eyes 2 (two) times daily.    Marland Kitchen ketoconazole (NIZORAL) 2 % cream Apply topically.    Marland Kitchen KLOR-CON M10 10 MEQ tablet Take 10 mEq by mouth 3 (three) times daily.  12  . zolpidem (AMBIEN) 10 MG tablet Take 1 tablet (10 mg total) by mouth at bedtime as needed for sleep. 30 tablet 2   No current facility-administered medications for this visit.     Review of Systems  Constitutional:  Constitutional negative. HENT: HENT negative.  Eyes: Eyes negative.  Respiratory: Respiratory negative.  Cardiovascular: Positive for leg swelling.  GI: Gastrointestinal negative.  Musculoskeletal: Positive for leg pain and joint pain.  Neurological: Neurological negative. Hematologic: Hematologic/lymphatic negative.  Psychiatric: Psychiatric negative.        Objective:  Objective   Vitals:   09/25/18 1014  BP: (!) 147/95  Pulse: 81  Resp: 14  Temp: (!) 97.4 F (36.3 C)  TempSrc: Oral  SpO2: 98%  Weight: 260 lb (117.9 kg)  Height: 5\' 10"  (1.778 m)   Body mass index is 37.31 kg/m.  Physical Exam  Constitutional: She is oriented to person, place, and time.  HENT:  Head: Normocephalic.  Eyes: Pupils are equal, round, and reactive to light.  Neck: Normal range of motion.  Cardiovascular: Normal rate.  Pulses:      Radial pulses are 2+ on the right side, and 2+ on the left side.       Dorsalis pedis pulses are 2+ on the right side, and 2+ on the left side.  Pulmonary/Chest: Effort normal.  Abdominal: Soft. She exhibits no mass.    Musculoskeletal: She exhibits edema.  Neurological: She is alert and oriented to person, place, and time.  Skin: Capillary refill takes less than 2 seconds.  Psychiatric: She has a normal mood and affect. Her behavior is normal. Judgment and thought content normal.    Data: I have independently interpreted her IVC iliac duplex which demonstrated likely patency of her common and external iliac veins on the right although obscured by bowel gas.  Saphenous vein 0.7 mm at the junction 0.5 mm after that and after branches becomes much more diminutive.  I also evaluated this with ultrasound there would be marginal saphenous vein for use in bypass.     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  Unfortunately she recently fell and has an injury.  We have discussed palma procedure she does not appear to have great conduit and so she may require PTFE or cryopreserved conduit.  I will see her back in 4 to 6 weeks with hopefully she will be over her injury at that time we get her back in compression stockings that she will definitely need these in the postoperative period.     Waynetta Sandy MD [facility]

## 2018-09-28 ENCOUNTER — Encounter (HOSPITAL_COMMUNITY): Payer: Self-pay | Admitting: Psychiatry

## 2018-09-28 ENCOUNTER — Ambulatory Visit (INDEPENDENT_AMBULATORY_CARE_PROVIDER_SITE_OTHER): Payer: Medicaid Other | Admitting: Psychiatry

## 2018-09-28 VITALS — BP 134/85 | HR 86 | Ht 70.0 in | Wt 265.0 lb

## 2018-09-28 DIAGNOSIS — F3130 Bipolar disorder, current episode depressed, mild or moderate severity, unspecified: Secondary | ICD-10-CM

## 2018-09-28 MED ORDER — DULOXETINE HCL 60 MG PO CPEP: 60.0000 mg | ORAL_CAPSULE | Freq: Two times a day (BID) | ORAL | 2 refills | Status: DC

## 2018-09-28 MED ORDER — AMPHETAMINE-DEXTROAMPHETAMINE 30 MG PO TABS: 30.0000 mg | ORAL_TABLET | Freq: Two times a day (BID) | ORAL | 0 refills | Status: DC

## 2018-09-28 MED ORDER — ALPRAZOLAM 1 MG PO TABS: 1.0000 mg | ORAL_TABLET | Freq: Two times a day (BID) | ORAL | 2 refills | Status: DC | PRN

## 2018-09-28 NOTE — Progress Notes (Signed)
BH MD/PA/NP OP Progress Note  09/28/2018 11:13 AM Jane [name]  MRN:  [id number]  Chief Complaint:  Chief Complaint    Depression; Anxiety; Follow-up     HPI: This patient is a 52year-old divorced white female who lives alone in [place]. She is on disability due to bipolar disorder.  The patient states that she became very depressed when her mother died in 02-10-2000 of a brain aneurysm. She started getting. Sad but eventually became manic and develop mood swings and agitated behavior. She's been on numerous medications over the years. In 10-Feb-1988 she also had postpartum depression. For many years she was on lithium but it caused a bad taste in her mouth and her doctor was worried it might be starting to affect her renal function so was discontinued. She also thinks that it stopped working after a number of years  The patient returns after 2 months.  Since I last saw her she had attempted vascular surgery but it did not go well.  She may need to have a more extensive procedure to get improve vascularization to her left leg.  In the meantime she fell and injured that same leg and now is wearing a leg brace.  She also has a rash and her arm in a brace because of carpal tunnel syndrome.  She states that she is "broken off" with both her sisters and 2 of her daughters.  She is tired of the way they have been treating her and does better without them by her report.  Her ex-boyfriend has been a good friend to her and takes her to all her appointments and has been supportive.  She states that she is not sure if she is more depressed or not but has "so much going on right now" that she does not want to change any of her medications.  For the most part she is sleeping pretty well.  She denies suicidal ideation Visit Diagnosis:    ICD-10-CM   1. Bipolar I disorder, most recent episode depressed ([place]) F31.30     Past Psychiatric History: Long-term outpatient treatment for bipolar disorder  Past Medical History:   Past Medical History:  Diagnosis Date  . Anxiety   . Bipolar disorder ([place])   . Depression   . GERD (gastroesophageal reflux disease)   . High cholesterol   . History of DVT (deep vein thrombosis)    left leg  . Hypertension    states under control with meds., has been on med. x 2 years  . Hypothyroidism   . Obsessive-compulsive disorder   . Restless leg syndrome   . Trigger thumb of left hand 2018/02/09    Past Surgical History:  Procedure Laterality Date  . ABDOMINAL HYSTERECTOMY  06/2016   complete  . CHOLECYSTECTOMY    . LOWER EXTREMITY VENOGRAPHY N/A 08/17/2018   Procedure: LOWER EXTREMITY VENOGRAPHY - Central Venogram;  Surgeon: Waynetta Sandy, MD;  Location: [facility];  Service: Cardiovascular;  Laterality: N/A;  . LUMBAR FUSION  11/21/2000   L5-S1  . LUMBAR SPINE SURGERY     x 2 others  . TRIGGER FINGER RELEASE Right 12/01/2017   Procedure: RELEASE TRIGGER FINGER/A-1 PULLEY RIGHT THUMB;  Surgeon: Leanora Cover, MD;  Location: [facility];  Service: Orthopedics;  Laterality: Right;  . TRIGGER FINGER RELEASE Left 01/26/2018   Procedure: LEFT TRIGGER THUMB RELEASE;  Surgeon: Leanora Cover, MD;  Location: [facility];  Service: Orthopedics;  Laterality: Left;  Family Psychiatric History: See below  Family History:  Family History  Problem Relation Age of Onset  . Heart disease Mother   . Hyperlipidemia Mother   . Hypertension Mother   . Bipolar disorder Mother   . Diabetes Father   . Heart disease Father   . Hyperlipidemia Father   . Hypertension Father   . Drug abuse Daughter   . ADD / ADHD Daughter   . Drug abuse Daughter   . Anxiety disorder Daughter   . Bipolar disorder Daughter   . Hypertension Sister   . Hypertension Brother   . Hyperlipidemia Brother   . Heart disease Brother   . Bipolar disorder Maternal Aunt   . Suicidality Maternal Aunt     Social History:  Social History   Socioeconomic History   . Marital status: Divorced    Spouse name: Not on file  . Number of children: Not on file  . Years of education: Not on file  . Highest education level: Not on file  Occupational History  . Not on file  Social Needs  . Financial resource strain: Not on file  . Food insecurity:    Worry: Not on file    Inability: Not on file  . Transportation needs:    Medical: Not on file    Non-medical: Not on file  Tobacco Use  . Smoking status: Never Smoker  . Smokeless tobacco: Never Used  Substance and Sexual Activity  . Alcohol use: No  . Drug use: No  . Sexual activity: Yes    Partners: Male    Birth control/protection: Surgical  Lifestyle  . Physical activity:    Days per week: Not on file    Minutes per session: Not on file  . Stress: Not on file  Relationships  . Social connections:    Talks on phone: Not on file    Gets together: Not on file    Attends religious service: Not on file    Active member of club or organization: Not on file    Attends meetings of clubs or organizations: Not on file    Relationship status: Not on file  Other Topics Concern  . Not on file  Social History Narrative  . Not on file    Allergies:  Allergies  Allergen Reactions  . Aripiprazole Other (See Comments)    BECOMES VIOLENT Very violent Very violent Very violent   . Chlorpromazine Other (See Comments)    SEVERE ANXIETY Shakes, anxiety off the roof and panic attack Shakes, anxiety off the roof and panic attack Shakes, anxiety off the roof and panic attack Shakes, anxiety off the roof and panic attack Shakes, anxiety off the roof and panic attack   . Gabapentin Other (See Comments)    NIGHTMARES Nightmares and talking to her nightmares Nightmares and talking to her nightmares Nightmares and talking to her nightmares Nightmares and talking to her nightmares Nightmares and talking to her nightmares   . Seroquel [Quetiapine Fumarate] Other (See Comments)    BECOMES VIOLENT     Metabolic Disorder Labs: No results found for: HGBA1C, MPG No results found for: PROLACTIN No results found for: CHOL, TRIG, HDL, CHOLHDL, VLDL, LDLCALC No results found for: TSH  Therapeutic Level Labs: No results found for: LITHIUM Lab Results  Component Value Date   VALPROATE 61.2 03/23/2014   No components found for:  CBMZ  Current Medications: Current Outpatient Medications  Medication Sig Dispense Refill  . ALPRAZolam (XANAX) 1 MG tablet  Take 1 tablet (1 mg total) by mouth 2 (two) times daily as needed for anxiety. 60 tablet 2  . amLODipine (NORVASC) 10 MG tablet Take 10 mg by mouth daily.     Marland Kitchen amphetamine-dextroamphetamine (ADDERALL) 30 MG tablet Take 1 tablet by mouth 2 (two) times daily. 60 tablet 0  . aspirin EC 81 MG tablet Take 81 mg by mouth daily.    Marland Kitchen aspirin-acetaminophen-caffeine (EXCEDRIN MIGRAINE) 250-250-65 MG tablet Take 2 tablets by mouth every 6 (six) hours as needed for headache or migraine.    Marland Kitchen atorvastatin (LIPITOR) 20 MG tablet Take 20 mg by mouth daily.    . carboxymethylcellul-glycerin (OPTIVE) 0.5-0.9 % ophthalmic solution Place 1 drop into both eyes 4 (four) times daily.    . cetirizine (ZYRTEC) 10 MG tablet Take 10 mg by mouth daily.    . cholecalciferol (VITAMIN D) 1000 units tablet Take 1,000 Units by mouth every evening.     . cyclobenzaprine (FLEXERIL) 10 MG tablet Take 10 mg by mouth at bedtime.     . cycloSPORINE (RESTASIS) 0.05 % ophthalmic emulsion Place 1 drop into both eyes 2 (two) times daily.    . diclofenac sodium (VOLTAREN) 1 % GEL Apply 2-4 g topically 2 (two) times daily as needed (for foot pain.).    Marland Kitchen DULoxetine (CYMBALTA) 60 MG capsule Take 1 capsule (60 mg total) by mouth 2 (two) times daily. 60 capsule 2  . hydrochlorothiazide (HYDRODIURIL) 25 MG tablet Take 25 mg by mouth daily.    Marland Kitchen HYDROcodone-acetaminophen (NORCO) 10-325 MG tablet Take 1 tablet by mouth every 6 (six) hours as needed (for pain.).    Marland Kitchen ketoconazole  (NIZORAL) 2 % cream Apply 1 application topically 2 (two) times daily as needed for irritation.     Marland Kitchen ketoconazole (NIZORAL) 2 % cream Apply topically.    Marland Kitchen KLOR-CON M10 10 MEQ tablet Take 10 mEq by mouth 3 (three) times daily.  12  . levothyroxine (SYNTHROID, LEVOTHROID) 137 MCG tablet Take 137 mcg by mouth daily before breakfast.    . linaclotide (LINZESS) 145 MCG CAPS capsule Take 145 mcg by mouth daily.     . Magnesium 250 MG TABS Take 250 mg by mouth daily.    . Melatonin 10 MG TBDP Take 10 mg by mouth at bedtime as needed (for sleep).    . Multiple Vitamin (MULTIVITAMIN WITH MINERALS) TABS tablet Take 1 tablet by mouth daily. One-A-Day Women's Multivitamin    . nystatin (MYCOSTATIN/NYSTOP) powder Apply 1 g topically 2 (two) times daily as needed (for skin irritation/rash).     . Omega-3 Fatty Acids (FISH OIL OMEGA-3 PO) Take 500 mg by mouth every evening.     . pantoprazole (PROTONIX) 40 MG tablet Take 40 mg by mouth daily.    Marland Kitchen PAZEO 0.7 % SOLN Place 1 drop into both eyes daily.    . potassium chloride (K-DUR) 10 MEQ tablet Take 10 mEq by mouth 3 (three) times daily.    . prednisoLONE acetate (PRED FORTE) 1 % ophthalmic suspension Place 1 drop into both eyes 4 (four) times daily.    Marland Kitchen rOPINIRole (REQUIP) 2 MG tablet Take 2 mg by mouth 2 (two) times daily.    Marland Kitchen XIIDRA 5 % SOLN Place 1 drop into both eyes 2 (two) times daily.    Marland Kitchen zolpidem (AMBIEN) 10 MG tablet Take 1 tablet (10 mg total) by mouth at bedtime as needed for sleep. 30 tablet 2   No current facility-administered medications for this  visit.      Musculoskeletal: Strength & Muscle Tone: decreased Gait & Station: unsteady Patient leans: N/A  Psychiatric Specialty Exam: Review of Systems  Musculoskeletal: Positive for falls, joint pain and myalgias.  Psychiatric/Behavioral: The patient is nervous/anxious.   All other systems reviewed and are negative.   Blood pressure 134/85, pulse 86, height 5\' 10"  (1.778 m), weight  265 lb (120.2 kg), SpO2 96 %.Body mass index is 38.02 kg/m.  General Appearance: Casual and Fairly Groomed  Eye Contact:  Good  Speech:  Clear and Coherent  Volume:  Normal  Mood:  Anxious and Irritable  Affect:  Appropriate and Congruent  Thought Process:  Goal Directed  Orientation:  Full (Time, Place, and Person)  Thought Content: Rumination   Suicidal Thoughts:  No  Homicidal Thoughts:  No  Memory:  Immediate;   Good Recent;   Good Remote;   Fair  Judgement:  Fair  Insight:  Shallow  Psychomotor Activity:  Decreased  Concentration:  Concentration: Fair and Attention Span: Fair  Recall:  Good  Fund of Knowledge: Good  Language: Good  Akathisia:  No  Handed:  Right  AIMS (if indicated): not done  Assets:  Communication Skills Desire for Improvement Resilience Social Support Talents/Skills  ADL's:  Intact  Cognition: WNL  Sleep:  Good   Screenings: PHQ2-9     Nutrition from 09/22/2014 in [facility]  PHQ-2 Total Score  4  PHQ-9 Total Score  15       Assessment and Plan: This patient is a 52 year old female with a history of presumed bipolar disorder and ADHD.  She is going through a lot of medical treatments right now and does not want any more changes in medications and feels like she is fairly stable.  She will continue Cymbalta 60 mg twice daily and Xanax 1 mg 2 times daily.  She will continue Adderall 30 mg twice daily for ADHD.  She will return to see me in 2 months   [name], MD 09/28/2018, 11:13 AM

## 2018-10-08 LAB — HM MAMMOGRAPHY

## 2018-10-20 ENCOUNTER — Ambulatory Visit (HOSPITAL_COMMUNITY): Payer: Medicaid Other | Admitting: Licensed Clinical Social Worker

## 2018-11-06 ENCOUNTER — Other Ambulatory Visit: Payer: Self-pay

## 2018-11-06 ENCOUNTER — Encounter: Payer: Self-pay | Admitting: *Deleted

## 2018-11-06 ENCOUNTER — Ambulatory Visit (INDEPENDENT_AMBULATORY_CARE_PROVIDER_SITE_OTHER): Payer: Medicaid Other | Admitting: Vascular Surgery

## 2018-11-06 ENCOUNTER — Other Ambulatory Visit: Payer: Self-pay | Admitting: *Deleted

## 2018-11-06 ENCOUNTER — Encounter: Payer: Self-pay | Admitting: Vascular Surgery

## 2018-11-06 VITALS — BP 153/91 | HR 89 | Temp 97.3°F | Resp 20 | Ht 70.0 in | Wt 260.0 lb

## 2018-11-06 DIAGNOSIS — I871 Compression of vein: Secondary | ICD-10-CM

## 2018-11-06 DIAGNOSIS — I87002 Postthrombotic syndrome without complications of left lower extremity: Secondary | ICD-10-CM

## 2018-11-06 NOTE — Progress Notes (Signed)
Patient ID: Jane [name], female   DOB: 10/22/1966, 53 y.o.   MRN: [id number]  Reason for Consult: Follow-up (6 wk f/u. Bilat leg pain. )   Referred by Sinda Du, MD  Subjective:     HPI:  Jane [name] is a 53 y.o. female with history of occluded left common external iliac veins.  She has persistent swelling of her left leg last little score 16.  She is wearing her compression stockings religiously.  Recently had an injury which was prohibiting this but now this has resolved.  She does not take anticoagulation only aspirin at this time.  Past Medical History:  Diagnosis Date  . Anxiety   . Bipolar disorder ([place])   . Depression   . GERD (gastroesophageal reflux disease)   . High cholesterol   . History of DVT (deep vein thrombosis)    left leg  . Hypertension    states under control with meds., has been on med. x 2 years  . Hypothyroidism   . Obsessive-compulsive disorder   . Restless leg syndrome   . Trigger thumb of left hand 01/2018   Family History  Problem Relation Age of Onset  . Heart disease Mother   . Hyperlipidemia Mother   . Hypertension Mother   . Bipolar disorder Mother   . Diabetes Father   . Heart disease Father   . Hyperlipidemia Father   . Hypertension Father   . Drug abuse Daughter   . ADD / ADHD Daughter   . Drug abuse Daughter   . Anxiety disorder Daughter   . Bipolar disorder Daughter   . Hypertension Sister   . Hypertension Brother   . Hyperlipidemia Brother   . Heart disease Brother   . Bipolar disorder Maternal Aunt   . Suicidality Maternal Aunt    Past Surgical History:  Procedure Laterality Date  . ABDOMINAL HYSTERECTOMY  06/2016   complete  . CHOLECYSTECTOMY    . LOWER EXTREMITY VENOGRAPHY N/A 08/17/2018   Procedure: LOWER EXTREMITY VENOGRAPHY - Central Venogram;  Surgeon: Waynetta Sandy, MD;  Location: [facility];  Service: Cardiovascular;  Laterality: N/A;  . LUMBAR FUSION  11/21/2000   L5-S1  .  LUMBAR SPINE SURGERY     x 2 others  . TRIGGER FINGER RELEASE Right 12/01/2017   Procedure: RELEASE TRIGGER FINGER/A-1 PULLEY RIGHT THUMB;  Surgeon: Leanora Cover, MD;  Location: [facility];  Service: Orthopedics;  Laterality: Right;  . TRIGGER FINGER RELEASE Left 01/26/2018   Procedure: LEFT TRIGGER THUMB RELEASE;  Surgeon: Leanora Cover, MD;  Location: [facility];  Service: Orthopedics;  Laterality: Left;    Short Social History:  Social History   Tobacco Use  . Smoking status: Never Smoker  . Smokeless tobacco: Never Used  Substance Use Topics  . Alcohol use: No    Allergies  Allergen Reactions  . Aripiprazole Other (See Comments)    BECOMES VIOLENT Very violent Very violent Very violent   . Chlorpromazine Other (See Comments)    SEVERE ANXIETY Shakes, anxiety off the roof and panic attack Shakes, anxiety off the roof and panic attack Shakes, anxiety off the roof and panic attack Shakes, anxiety off the roof and panic attack Shakes, anxiety off the roof and panic attack   . Gabapentin Other (See Comments)    NIGHTMARES Nightmares and talking to her nightmares Nightmares and talking to her nightmares Nightmares and talking to her nightmares Nightmares and talking to her nightmares Nightmares and  talking to her nightmares   . Seroquel [Quetiapine Fumarate] Other (See Comments)    BECOMES VIOLENT    Current Outpatient Medications  Medication Sig Dispense Refill  . ALPRAZolam (XANAX) 1 MG tablet Take 1 tablet (1 mg total) by mouth 2 (two) times daily as needed for anxiety. 60 tablet 2  . amLODipine (NORVASC) 10 MG tablet Take 10 mg by mouth daily.     Marland Kitchen amphetamine-dextroamphetamine (ADDERALL) 30 MG tablet Take 1 tablet by mouth 2 (two) times daily. 60 tablet 0  . amphetamine-dextroamphetamine (ADDERALL) 30 MG tablet Take 1 tablet by mouth 2 (two) times daily. Fill after 10/27/18 60 tablet 0  . aspirin EC 81 MG tablet Take 81 mg by mouth  daily.    Marland Kitchen aspirin-acetaminophen-caffeine (EXCEDRIN MIGRAINE) 250-250-65 MG tablet Take 2 tablets by mouth every 6 (six) hours as needed for headache or migraine.    Marland Kitchen atorvastatin (LIPITOR) 20 MG tablet Take 20 mg by mouth daily.    . carboxymethylcellul-glycerin (OPTIVE) 0.5-0.9 % ophthalmic solution Place 1 drop into both eyes 4 (four) times daily.    . cetirizine (ZYRTEC) 10 MG tablet Take 10 mg by mouth daily.    . cholecalciferol (VITAMIN D) 1000 units tablet Take 1,000 Units by mouth every evening.     . cyclobenzaprine (FLEXERIL) 10 MG tablet Take 10 mg by mouth at bedtime.     . cycloSPORINE (RESTASIS) 0.05 % ophthalmic emulsion Place 1 drop into both eyes 2 (two) times daily.    . diclofenac sodium (VOLTAREN) 1 % GEL Apply 2-4 g topically 2 (two) times daily as needed (for foot pain.).    Marland Kitchen DULoxetine (CYMBALTA) 60 MG capsule Take 1 capsule (60 mg total) by mouth 2 (two) times daily. 60 capsule 2  . hydrochlorothiazide (HYDRODIURIL) 25 MG tablet Take 25 mg by mouth daily.    Marland Kitchen HYDROcodone-acetaminophen (NORCO) 10-325 MG tablet Take 1 tablet by mouth every 6 (six) hours as needed (for pain.).    Marland Kitchen ketoconazole (NIZORAL) 2 % cream Apply 1 application topically 2 (two) times daily as needed for irritation.     Marland Kitchen ketoconazole (NIZORAL) 2 % cream Apply topically.    Marland Kitchen KLOR-CON M10 10 MEQ tablet Take 10 mEq by mouth 3 (three) times daily.  12  . levothyroxine (SYNTHROID, LEVOTHROID) 137 MCG tablet Take 137 mcg by mouth daily before breakfast.    . linaclotide (LINZESS) 145 MCG CAPS capsule Take 145 mcg by mouth daily.     . Magnesium 250 MG TABS Take 250 mg by mouth daily.    . Melatonin 10 MG TBDP Take 10 mg by mouth at bedtime as needed (for sleep).    . Multiple Vitamin (MULTIVITAMIN WITH MINERALS) TABS tablet Take 1 tablet by mouth daily. One-A-Day Women's Multivitamin    . nystatin (MYCOSTATIN/NYSTOP) powder Apply 1 g topically 2 (two) times daily as needed (for skin irritation/rash).      . Omega-3 Fatty Acids (FISH OIL OMEGA-3 PO) Take 500 mg by mouth every evening.     . pantoprazole (PROTONIX) 40 MG tablet Take 40 mg by mouth daily.    Marland Kitchen PAZEO 0.7 % SOLN Place 1 drop into both eyes daily.    . potassium chloride (K-DUR) 10 MEQ tablet Take 10 mEq by mouth 3 (three) times daily.    . prednisoLONE acetate (PRED FORTE) 1 % ophthalmic suspension Place 1 drop into both eyes 4 (four) times daily.    Marland Kitchen rOPINIRole (REQUIP) 2 MG tablet Take 2  mg by mouth 2 (two) times daily.    Marland Kitchen XIIDRA 5 % SOLN Place 1 drop into both eyes 2 (two) times daily.    Marland Kitchen zolpidem (AMBIEN) 10 MG tablet Take 1 tablet (10 mg total) by mouth at bedtime as needed for sleep. 30 tablet 2   No current facility-administered medications for this visit.     Review of Systems  Constitutional:  Constitutional negative. HENT: HENT negative.  Cardiovascular: Positive for leg swelling.  Musculoskeletal: Positive for leg pain.  Skin: Skin negative.  Neurological: Positive for focal weakness.  Hematologic: Hematologic/lymphatic negative.  Psychiatric: Positive for depressed mood.        Objective:  Objective   Vitals:   11/06/18 1036  BP: (!) 153/91  Pulse: 89  Resp: 20  Temp: (!) 97.3 F (36.3 C)  TempSrc: Oral  SpO2: 99%  Weight: 260 lb (117.9 kg)  Height: 5\' 10"  (1.778 m)   Body mass index is 37.31 kg/m.  Physical Exam Constitutional:      Appearance: Normal appearance.  HENT:     Head: Normocephalic.  Neck:     Musculoskeletal: Normal range of motion and neck supple.  Cardiovascular:     Rate and Rhythm: Regular rhythm.     Pulses:          Radial pulses are 2+ on the right side and 2+ on the left side.       Dorsalis pedis pulses are 2+ on the right side and 2+ on the left side.  Musculoskeletal:        General: Swelling present.     Comments: She has compression stockings on today right leg measuring 43.5 cm in left 46 cm  Skin:    General: Skin is warm and dry.  Neurological:      General: No focal deficit present.     Mental Status: She is alert.  Psychiatric:        Mood and Affect: Mood normal.        Behavior: Behavior normal.        Thought Content: Thought content normal.        Judgment: Judgment normal.     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  She would best be served with palma procedure we will do this with femoral vein from the left to right and also add a AV fistula.  Possible we could use the saphenous vein at the time of surgery from the right leg but I believe this will be marginal in size as we get further down the leg but we will evaluate with ultrasound.  We will put her on oral anticoagulation following this procedure for some time she will also need to wear compression stockings continue walking.  I discussed with her that this may not last for the entirety of her life and also that she will not be fully healed from this but may be have a ball to score from 16 into a more mild range less than 10.  She demonstrates very good understanding we will get her scheduled today     Waynetta Sandy MD [facility]

## 2018-11-16 NOTE — Pre-Procedure Instructions (Signed)
Jane [name]  11/16/2018      [id number] [place], [place] [address] [place] [address] Phone: [phone] Fax: [phone]  [place], [place], [place] [address] [place] [address] Phone: [phone] Fax: [phone]    Your procedure is scheduled on Tuesday, January 21st.  Report to [facility] at 5:30 A.M.  Call this number if you have problems the morning of surgery:  [phone]   Remember:  Do not eat or drink after midnight.    Take these medicines the morning of surgery with A SIP OF WATER  ALPRAZolam (XANAX)-if needed amLODipine (NORVASC) cetirizine (ZYRTEC) Eye drops DULoxetine (CYMBALTA)  fluticasone (FLONASE)-if needed HYDROcodone-acetaminophen (NORCO)-if needed levothyroxine (SYNTHROID, LEVOTHROID)  pantoprazole (PROTONIX) linaclotide (LINZESS) rOPINIRole (REQUIP)  Follow your surgeon's instructions on when to stop Aspirin.  If no instructions were given by your surgeon then you will need to call the office to get those instructions.    As of today, STOP taking any (EXCEDRIN MIGRAINE), diclofenac (voltaren) gel, Aleve, Naproxen, Ibuprofen, Motrin, Advil, Goody's, BC's, all herbal medications, fish oil, and all vitamins.     Do not wear jewelry, make-up or nail polish.  Do not wear lotions, powders, or perfumes, or deodorant.  Do not shave 48 hours prior to surgery.   Do not bring valuables to the hospital.  [facility] is not responsible for any belongings or valuables.  Contacts, dentures or bridgework may not be worn into surgery.  Leave your suitcase in the car.  After surgery it may be brought to your room.  For patients admitted to the hospital, discharge time will be determined by your treatment team.  Patients discharged the day of surgery will not be allowed to drive home.   Special instructions:   [facility]- Preparing For Surgery  Before surgery, you can play an important role. Because skin is not sterile, your skin needs to be as free of germs as possible. You can reduce the number of germs on your skin by washing with CHG (chlorahexidine gluconate) Soap before surgery.  CHG is an antiseptic cleaner which kills germs and bonds with the skin to continue killing germs even after washing.    Oral Hygiene is also important to reduce your risk of infection.  Remember - BRUSH YOUR TEETH THE MORNING OF SURGERY WITH YOUR REGULAR TOOTHPASTE  Please do not use if you have an allergy to CHG or antibacterial soaps. If your skin becomes reddened/irritated stop using the CHG.  Do not shave (including legs and underarms) for at least 48 hours prior to first CHG shower. It is OK to shave your face.  Please follow these instructions carefully.   1. Shower the NIGHT BEFORE SURGERY and the MORNING OF SURGERY with CHG.   2. If you chose to wash your hair, wash your hair first as usual with your normal shampoo.  3. After you shampoo, rinse your hair and body thoroughly to remove the shampoo.  4. Use CHG as you would any other liquid soap. You can apply CHG directly to the skin and wash gently with a scrungie or a clean washcloth.   5. Apply the CHG Soap to your body ONLY FROM THE NECK DOWN.  Do not use on open wounds or open sores. Avoid contact with your eyes, ears, mouth and genitals (private parts).  Wash Face and genitals (private parts)  with your normal soap.  6. Wash thoroughly, paying special attention to the area where your surgery will be performed.  7. Thoroughly rinse your body with warm water from the neck down.  8. DO NOT shower/wash with your normal soap after using and rinsing off the CHG Soap.  9. Pat yourself dry with a CLEAN TOWEL.  10. Wear CLEAN PAJAMAS to bed the night before surgery, wear comfortable clothes the morning of surgery  11. Place CLEAN SHEETS on your bed the night of  your first shower and DO NOT SLEEP WITH PETS.    Day of Surgery:  Do not apply any deodorants/lotions.  Please wear clean clothes to the hospital/surgery center.   Remember to brush your teeth WITH YOUR REGULAR TOOTHPASTE.   Please read over the following fact sheets that you were given.

## 2018-11-17 ENCOUNTER — Encounter (HOSPITAL_COMMUNITY)
Admission: RE | Admit: 2018-11-17 | Discharge: 2018-11-17 | Disposition: A | Discharge status: Home / Self Care | Payer: Medicaid Other | Source: Ambulatory Visit | Attending: Vascular Surgery | Admitting: Vascular Surgery

## 2018-11-17 ENCOUNTER — Encounter (HOSPITAL_COMMUNITY): Payer: Self-pay

## 2018-11-17 ENCOUNTER — Other Ambulatory Visit: Payer: Self-pay

## 2018-11-17 DIAGNOSIS — Z01812 Encounter for preprocedural laboratory examination: Secondary | ICD-10-CM | POA: Diagnosis present

## 2018-11-17 HISTORY — DX: Trigger thumb, right thumb: M65.311

## 2018-11-17 HISTORY — DX: Chronic kidney disease, unspecified: N18.9

## 2018-11-17 LAB — PROTIME-INR
INR: 0.95
Prothrombin Time: 12.6 seconds (ref 11.4–15.2)

## 2018-11-17 LAB — TYPE AND SCREEN
ABO/RH(D): O POS
Antibody Screen: NEGATIVE

## 2018-11-17 LAB — URINALYSIS, ROUTINE W REFLEX MICROSCOPIC
Bacteria, UA: NONE SEEN
Bilirubin Urine: NEGATIVE
Glucose, UA: NEGATIVE mg/dL
Ketones, ur: NEGATIVE mg/dL
Leukocytes, UA: NEGATIVE
Nitrite: NEGATIVE
PROTEIN: 100 mg/dL — AB
Specific Gravity, Urine: 1.02 (ref 1.005–1.030)
pH: 5 (ref 5.0–8.0)

## 2018-11-17 LAB — COMPREHENSIVE METABOLIC PANEL
ALT: 25 U/L (ref 0–44)
AST: 21 U/L (ref 15–41)
Albumin: 4.3 g/dL (ref 3.5–5.0)
Alkaline Phosphatase: 117 U/L (ref 38–126)
Anion gap: 16 — ABNORMAL HIGH (ref 5–15)
BUN: 25 mg/dL — ABNORMAL HIGH (ref 6–20)
CHLORIDE: 98 mmol/L (ref 98–111)
CO2: 26 mmol/L (ref 22–32)
Calcium: 9.8 mg/dL (ref 8.9–10.3)
Creatinine, Ser: 1.32 mg/dL — ABNORMAL HIGH (ref 0.44–1.00)
GFR calc Af Amer: 54 mL/min — ABNORMAL LOW (ref >60)
GFR calc non Af Amer: 46 mL/min — ABNORMAL LOW (ref >60)
Glucose, Bld: 109 mg/dL — ABNORMAL HIGH (ref 70–99)
Potassium: 3.2 mmol/L — ABNORMAL LOW (ref 3.5–5.1)
Sodium: 140 mmol/L (ref 135–145)
Total Bilirubin: 0.9 mg/dL (ref 0.3–1.2)
Total Protein: 8.1 g/dL (ref 6.5–8.1)

## 2018-11-17 LAB — CBC
HCT: 45.1 % (ref 36.0–46.0)
Hemoglobin: 14.3 g/dL (ref 12.0–15.0)
MCH: 28 pg (ref 26.0–34.0)
MCHC: 31.7 g/dL (ref 30.0–36.0)
MCV: 88.4 fL (ref 80.0–100.0)
Platelets: 241 10*3/uL (ref 150–400)
RBC: 5.1 MIL/uL (ref 3.87–5.11)
RDW: 13.7 % (ref 11.5–15.5)
WBC: 8.9 10*3/uL (ref 4.0–10.5)
nRBC: 0 % (ref 0.0–0.2)

## 2018-11-17 LAB — ABO/RH: ABO/RH(D): O POS

## 2018-11-17 LAB — APTT: aPTT: 27 seconds (ref 24–36)

## 2018-11-17 LAB — SURGICAL PCR SCREEN
MRSA, PCR: NEGATIVE
STAPHYLOCOCCUS AUREUS: POSITIVE — AB

## 2018-11-17 NOTE — Progress Notes (Signed)
PCP - Dr. Sinda Du Cardiologist - denies  Chest x-ray - N/A EKG - 11/27/17 Stress Test - denies ECHO - denies Cardiac Cath - denies  Sleep Study - denies, positive stop bang, sent to PCP CPAP - denies  Aspirin Instructions:Continue up until DOS.   Anesthesia review: No  Patient denies shortness of breath, fever, cough and chest pain at PAT appointment   Patient verbalized understanding of instructions that were given to them at the PAT appointment. Patient was also instructed that they will need to review over the PAT instructions again at home before surgery.

## 2018-11-17 NOTE — Progress Notes (Signed)
   11/17/18 1303  OBSTRUCTIVE SLEEP APNEA  Have you ever been diagnosed with sleep apnea through a sleep study? No  Do you snore loudly (loud enough to be heard through closed doors)?  1  Do you often feel tired, fatigued, or sleepy during the daytime (such as falling asleep during driving or talking to someone)? 0  Has anyone observed you stop breathing during your sleep? 0  Do you have, or are you being treated for high blood pressure? 1  BMI more than 35 kg/m2? 1  Age > 50 (1-yes) 1  Neck circumference greater than:Female 16 inches or larger, Female 17inches or larger? 1  Female Gender (Yes=1) 0  Obstructive Sleep Apnea Score 5  Score 5 or greater  Results sent to PCP

## 2018-11-18 ENCOUNTER — Encounter: Payer: Self-pay | Admitting: Hematology

## 2018-11-18 ENCOUNTER — Ambulatory Visit (HOSPITAL_COMMUNITY): Payer: Self-pay | Admitting: Licensed Clinical Social Worker

## 2018-11-18 LAB — HEMOGLOBIN A1C: Hemoglobin A1C: 7.5

## 2018-11-23 MED ORDER — DEXTROSE 5 % IV SOLN
3.0000 g | INTRAVENOUS | Status: AC
  Administered 2018-11-24 (×2): 3 g via INTRAVENOUS
  Filled 2018-11-23: qty 3

## 2018-11-24 ENCOUNTER — Encounter (HOSPITAL_COMMUNITY): Payer: Self-pay

## 2018-11-24 ENCOUNTER — Inpatient Hospital Stay (HOSPITAL_COMMUNITY)
Admission: RE | Admit: 2018-11-24 | Discharge: 2018-11-26 | DRG: 253 | Disposition: A | Discharge status: Home / Self Care | Payer: Medicaid Other | Attending: Vascular Surgery | Admitting: Vascular Surgery

## 2018-11-24 ENCOUNTER — Inpatient Hospital Stay (HOSPITAL_COMMUNITY): Payer: Medicaid Other | Admitting: Anesthesiology

## 2018-11-24 ENCOUNTER — Other Ambulatory Visit: Payer: Self-pay

## 2018-11-24 ENCOUNTER — Encounter (HOSPITAL_COMMUNITY)
Admission: RE | Disposition: A | Discharge status: Home / Self Care | Payer: Self-pay | Source: Home / Self Care | Attending: Vascular Surgery

## 2018-11-24 DIAGNOSIS — K219 Gastro-esophageal reflux disease without esophagitis: Secondary | ICD-10-CM | POA: Diagnosis not present

## 2018-11-24 DIAGNOSIS — Z79890 Hormone replacement therapy (postmenopausal): Secondary | ICD-10-CM

## 2018-11-24 DIAGNOSIS — Z86718 Personal history of other venous thrombosis and embolism: Secondary | ICD-10-CM | POA: Diagnosis not present

## 2018-11-24 DIAGNOSIS — G2581 Restless legs syndrome: Secondary | ICD-10-CM | POA: Diagnosis present

## 2018-11-24 DIAGNOSIS — Z79891 Long term (current) use of opiate analgesic: Secondary | ICD-10-CM | POA: Diagnosis not present

## 2018-11-24 DIAGNOSIS — M65311 Trigger thumb, right thumb: Secondary | ICD-10-CM | POA: Diagnosis not present

## 2018-11-24 DIAGNOSIS — N183 Chronic kidney disease, stage 3 (moderate): Secondary | ICD-10-CM | POA: Diagnosis present

## 2018-11-24 DIAGNOSIS — Z888 Allergy status to other drugs, medicaments and biological substances status: Secondary | ICD-10-CM

## 2018-11-24 DIAGNOSIS — F319 Bipolar disorder, unspecified: Secondary | ICD-10-CM | POA: Diagnosis not present

## 2018-11-24 DIAGNOSIS — Z79899 Other long term (current) drug therapy: Secondary | ICD-10-CM

## 2018-11-24 DIAGNOSIS — Z7951 Long term (current) use of inhaled steroids: Secondary | ICD-10-CM | POA: Diagnosis not present

## 2018-11-24 DIAGNOSIS — I871 Compression of vein: Secondary | ICD-10-CM | POA: Diagnosis not present

## 2018-11-24 DIAGNOSIS — I129 Hypertensive chronic kidney disease with stage 1 through stage 4 chronic kidney disease, or unspecified chronic kidney disease: Secondary | ICD-10-CM | POA: Diagnosis present

## 2018-11-24 DIAGNOSIS — I87092 Postthrombotic syndrome with other complications of left lower extremity: Principal | ICD-10-CM | POA: Diagnosis present

## 2018-11-24 DIAGNOSIS — F429 Obsessive-compulsive disorder, unspecified: Secondary | ICD-10-CM | POA: Diagnosis present

## 2018-11-24 DIAGNOSIS — F419 Anxiety disorder, unspecified: Secondary | ICD-10-CM | POA: Diagnosis not present

## 2018-11-24 DIAGNOSIS — E039 Hypothyroidism, unspecified: Secondary | ICD-10-CM | POA: Diagnosis present

## 2018-11-24 DIAGNOSIS — M65312 Trigger thumb, left thumb: Secondary | ICD-10-CM | POA: Diagnosis not present

## 2018-11-24 DIAGNOSIS — Z7982 Long term (current) use of aspirin: Secondary | ICD-10-CM | POA: Diagnosis not present

## 2018-11-24 DIAGNOSIS — E7800 Pure hypercholesterolemia, unspecified: Secondary | ICD-10-CM | POA: Diagnosis not present

## 2018-11-24 DIAGNOSIS — I87009 Postthrombotic syndrome without complications of unspecified extremity: Secondary | ICD-10-CM

## 2018-11-24 HISTORY — PX: FEMORAL-FEMORAL BYPASS GRAFT: SHX936

## 2018-11-24 HISTORY — PX: AV FISTULA PLACEMENT: SHX1204

## 2018-11-24 SURGERY — CREATION, BYPASS, ARTERIAL, FEMORAL TO FEMORAL, USING GRAFT
Anesthesia: General | Laterality: Left

## 2018-11-24 MED ORDER — PROPOFOL 1000 MG/100ML IV EMUL
INTRAVENOUS | Status: AC
  Filled 2018-11-24: qty 100

## 2018-11-24 MED ORDER — LIDOCAINE 2% (20 MG/ML) 5 ML SYRINGE
INTRAMUSCULAR | Status: AC
  Filled 2018-11-24: qty 5

## 2018-11-24 MED ORDER — CEFAZOLIN SODIUM-DEXTROSE 2-4 GM/100ML-% IV SOLN
2.0000 g | Freq: Three times a day (TID) | INTRAVENOUS | Status: AC
  Administered 2018-11-24 – 2018-11-25 (×2): 2 g via INTRAVENOUS
  Filled 2018-11-24 (×3): qty 100

## 2018-11-24 MED ORDER — CHLORHEXIDINE GLUCONATE CLOTH 2 % EX PADS: 6.0000 | MEDICATED_PAD | Freq: Once | CUTANEOUS | Status: DC

## 2018-11-24 MED ORDER — SODIUM CHLORIDE 0.9 % IV SOLN
INTRAVENOUS | Status: DC | PRN
  Administered 2018-11-24: 50 ug/min via INTRAVENOUS

## 2018-11-24 MED ORDER — ONDANSETRON HCL 4 MG/2ML IJ SOLN
INTRAMUSCULAR | Status: DC | PRN
  Administered 2018-11-24: 4 mg via INTRAVENOUS

## 2018-11-24 MED ORDER — POTASSIUM CHLORIDE CRYS ER 20 MEQ PO TBCR: 20.0000 meq | EXTENDED_RELEASE_TABLET | Freq: Every day | ORAL | Status: DC | PRN

## 2018-11-24 MED ORDER — MIDAZOLAM HCL 2 MG/2ML IJ SOLN
INTRAMUSCULAR | Status: AC
  Filled 2018-11-24: qty 2

## 2018-11-24 MED ORDER — ROCURONIUM BROMIDE 50 MG/5ML IV SOSY
PREFILLED_SYRINGE | INTRAVENOUS | Status: AC
  Filled 2018-11-24: qty 15

## 2018-11-24 MED ORDER — OXYCODONE HCL 5 MG/5ML PO SOLN: 5.0000 mg | Freq: Once | ORAL | Status: DC | PRN

## 2018-11-24 MED ORDER — PROPOFOL 10 MG/ML IV BOLUS
INTRAVENOUS | Status: DC | PRN
  Administered 2018-11-24: 50 mg via INTRAVENOUS
  Administered 2018-11-24: 30 mg via INTRAVENOUS
  Administered 2018-11-24: 200 mg via INTRAVENOUS
  Administered 2018-11-24: 40 mg via INTRAVENOUS
  Administered 2018-11-24: 30 mg via INTRAVENOUS

## 2018-11-24 MED ORDER — LEVOTHYROXINE SODIUM 25 MCG PO TABS
137.0000 ug | ORAL_TABLET | Freq: Every day | ORAL | Status: DC
  Administered 2018-11-25 – 2018-11-26 (×2): 137 ug via ORAL
  Filled 2018-11-24 (×2): qty 1

## 2018-11-24 MED ORDER — DEXAMETHASONE SODIUM PHOSPHATE 10 MG/ML IJ SOLN
INTRAMUSCULAR | Status: DC | PRN
  Administered 2018-11-24: 10 mg via INTRAVENOUS

## 2018-11-24 MED ORDER — FENTANYL CITRATE (PF) 100 MCG/2ML IJ SOLN: 25.0000 ug | INTRAMUSCULAR | Status: DC | PRN

## 2018-11-24 MED ORDER — SODIUM CHLORIDE 0.9 % IV SOLN
INTRAVENOUS | Status: AC
  Filled 2018-11-24: qty 1.2

## 2018-11-24 MED ORDER — HEPARIN (PORCINE) 25000 UT/250ML-% IV SOLN
800.0000 [IU]/h | INTRAVENOUS | Status: DC
  Administered 2018-11-24: 800 [IU]/h via INTRAVENOUS
  Filled 2018-11-24: qty 250

## 2018-11-24 MED ORDER — ATORVASTATIN CALCIUM 10 MG PO TABS
20.0000 mg | ORAL_TABLET | Freq: Every day | ORAL | Status: DC
  Administered 2018-11-25 – 2018-11-26 (×2): 20 mg via ORAL
  Filled 2018-11-24 (×3): qty 2

## 2018-11-24 MED ORDER — FENTANYL CITRATE (PF) 250 MCG/5ML IJ SOLN
INTRAMUSCULAR | Status: AC
  Filled 2018-11-24: qty 5

## 2018-11-24 MED ORDER — EPHEDRINE SULFATE 50 MG/ML IJ SOLN
INTRAMUSCULAR | Status: DC | PRN
  Administered 2018-11-24 (×2): 5 mg via INTRAVENOUS

## 2018-11-24 MED ORDER — GUAIFENESIN-DM 100-10 MG/5ML PO SYRP: 15.0000 mL | ORAL_SOLUTION | ORAL | Status: DC | PRN

## 2018-11-24 MED ORDER — MIDAZOLAM HCL 5 MG/5ML IJ SOLN
INTRAMUSCULAR | Status: DC | PRN
  Administered 2018-11-24: 2 mg via INTRAVENOUS

## 2018-11-24 MED ORDER — EPHEDRINE 5 MG/ML INJ
INTRAVENOUS | Status: AC
  Filled 2018-11-24: qty 10

## 2018-11-24 MED ORDER — AMLODIPINE BESYLATE 10 MG PO TABS
10.0000 mg | ORAL_TABLET | Freq: Every day | ORAL | Status: DC
  Administered 2018-11-24 – 2018-11-26 (×3): 10 mg via ORAL
  Filled 2018-11-24 (×3): qty 1

## 2018-11-24 MED ORDER — ACETAMINOPHEN 325 MG PO TABS
325.0000 mg | ORAL_TABLET | ORAL | Status: DC | PRN
  Administered 2018-11-25 – 2018-11-26 (×2): 650 mg via ORAL
  Filled 2018-11-24 (×2): qty 2

## 2018-11-24 MED ORDER — PHENYLEPHRINE 40 MCG/ML (10ML) SYRINGE FOR IV PUSH (FOR BLOOD PRESSURE SUPPORT)
PREFILLED_SYRINGE | INTRAVENOUS | Status: AC
  Filled 2018-11-24: qty 10

## 2018-11-24 MED ORDER — PHENYLEPHRINE HCL 10 MG/ML IJ SOLN
INTRAMUSCULAR | Status: DC | PRN
  Administered 2018-11-24 (×2): 40 ug via INTRAVENOUS

## 2018-11-24 MED ORDER — ROPINIROLE HCL 1 MG PO TABS
2.0000 mg | ORAL_TABLET | Freq: Two times a day (BID) | ORAL | Status: DC
  Administered 2018-11-24 – 2018-11-26 (×4): 2 mg via ORAL
  Filled 2018-11-24 (×4): qty 2

## 2018-11-24 MED ORDER — HYDROCODONE-ACETAMINOPHEN 10-325 MG PO TABS
1.0000 | ORAL_TABLET | Freq: Four times a day (QID) | ORAL | Status: DC | PRN
  Administered 2018-11-24 – 2018-11-26 (×6): 1 via ORAL
  Filled 2018-11-24 (×6): qty 1

## 2018-11-24 MED ORDER — HEPARIN SODIUM (PORCINE) 1000 UNIT/ML IJ SOLN
INTRAMUSCULAR | Status: DC | PRN
  Administered 2018-11-24: 4000 [IU] via INTRAVENOUS
  Administered 2018-11-24: 12000 [IU] via INTRAVENOUS

## 2018-11-24 MED ORDER — ALUM & MAG HYDROXIDE-SIMETH 200-200-20 MG/5ML PO SUSP: 15.0000 mL | ORAL | Status: DC | PRN

## 2018-11-24 MED ORDER — DEXAMETHASONE SODIUM PHOSPHATE 10 MG/ML IJ SOLN
INTRAMUSCULAR | Status: AC
  Filled 2018-11-24: qty 1

## 2018-11-24 MED ORDER — PANTOPRAZOLE SODIUM 40 MG PO TBEC
40.0000 mg | DELAYED_RELEASE_TABLET | Freq: Every day | ORAL | Status: DC
  Administered 2018-11-25 – 2018-11-26 (×2): 40 mg via ORAL
  Filled 2018-11-24 (×2): qty 1

## 2018-11-24 MED ORDER — ROCURONIUM BROMIDE 50 MG/5ML IV SOSY
PREFILLED_SYRINGE | INTRAVENOUS | Status: DC | PRN
  Administered 2018-11-24: 20 mg via INTRAVENOUS
  Administered 2018-11-24: 50 mg via INTRAVENOUS
  Administered 2018-11-24: 20 mg via INTRAVENOUS
  Administered 2018-11-24: 50 mg via INTRAVENOUS
  Administered 2018-11-24: 10 mg via INTRAVENOUS

## 2018-11-24 MED ORDER — MORPHINE SULFATE (PF) 2 MG/ML IV SOLN
2.0000 mg | INTRAVENOUS | Status: DC | PRN
  Administered 2018-11-24 – 2018-11-25 (×2): 2 mg via INTRAVENOUS
  Filled 2018-11-24 (×2): qty 1

## 2018-11-24 MED ORDER — DULOXETINE HCL 60 MG PO CPEP
60.0000 mg | ORAL_CAPSULE | Freq: Two times a day (BID) | ORAL | Status: DC
  Administered 2018-11-24 – 2018-11-26 (×4): 60 mg via ORAL
  Filled 2018-11-24 (×4): qty 1

## 2018-11-24 MED ORDER — ONDANSETRON HCL 4 MG/2ML IJ SOLN: 4.0000 mg | Freq: Four times a day (QID) | INTRAMUSCULAR | Status: DC | PRN

## 2018-11-24 MED ORDER — SODIUM CHLORIDE 0.9 % IV SOLN: 500.0000 mL | Freq: Once | INTRAVENOUS | Status: DC | PRN

## 2018-11-24 MED ORDER — SODIUM CHLORIDE 0.9 % IV SOLN
INTRAVENOUS | Status: DC | PRN
  Administered 2018-11-24: 08:00:00 via INTRAVENOUS

## 2018-11-24 MED ORDER — ONDANSETRON HCL 4 MG/2ML IJ SOLN: 4.0000 mg | Freq: Once | INTRAMUSCULAR | Status: DC | PRN

## 2018-11-24 MED ORDER — CEFAZOLIN SODIUM 1 G IJ SOLR
INTRAMUSCULAR | Status: AC
  Filled 2018-11-24: qty 30

## 2018-11-24 MED ORDER — ACETAMINOPHEN 325 MG RE SUPP: 325.0000 mg | RECTAL | Status: DC | PRN

## 2018-11-24 MED ORDER — PROPOFOL 500 MG/50ML IV EMUL
INTRAVENOUS | Status: DC | PRN
  Administered 2018-11-24: 50 ug/kg/min via INTRAVENOUS

## 2018-11-24 MED ORDER — DOCUSATE SODIUM 100 MG PO CAPS
100.0000 mg | ORAL_CAPSULE | Freq: Every day | ORAL | Status: DC
  Administered 2018-11-25 – 2018-11-26 (×2): 100 mg via ORAL
  Filled 2018-11-24 (×2): qty 1

## 2018-11-24 MED ORDER — METOPROLOL TARTRATE 5 MG/5ML IV SOLN: 2.0000 mg | INTRAVENOUS | Status: DC | PRN

## 2018-11-24 MED ORDER — OXYCODONE HCL 5 MG PO TABS: 5.0000 mg | ORAL_TABLET | Freq: Once | ORAL | Status: DC | PRN

## 2018-11-24 MED ORDER — LORATADINE 10 MG PO TABS
10.0000 mg | ORAL_TABLET | Freq: Every day | ORAL | Status: DC
  Administered 2018-11-24 – 2018-11-26 (×3): 10 mg via ORAL
  Filled 2018-11-24 (×3): qty 1

## 2018-11-24 MED ORDER — ASPIRIN EC 81 MG PO TBEC
81.0000 mg | DELAYED_RELEASE_TABLET | Freq: Every day | ORAL | Status: DC
  Administered 2018-11-24 – 2018-11-26 (×3): 81 mg via ORAL
  Filled 2018-11-24 (×3): qty 1

## 2018-11-24 MED ORDER — FENTANYL CITRATE (PF) 100 MCG/2ML IJ SOLN
INTRAMUSCULAR | Status: DC | PRN
  Administered 2018-11-24 (×7): 50 ug via INTRAVENOUS

## 2018-11-24 MED ORDER — HYDRALAZINE HCL 20 MG/ML IJ SOLN: 5.0000 mg | INTRAMUSCULAR | Status: DC | PRN

## 2018-11-24 MED ORDER — SUGAMMADEX SODIUM 500 MG/5ML IV SOLN
INTRAVENOUS | Status: AC
  Filled 2018-11-24: qty 5

## 2018-11-24 MED ORDER — HEPARIN SODIUM (PORCINE) 1000 UNIT/ML IJ SOLN
INTRAMUSCULAR | Status: AC
  Filled 2018-11-24: qty 2

## 2018-11-24 MED ORDER — SODIUM CHLORIDE 0.9 % IV SOLN
INTRAVENOUS | Status: DC
  Administered 2018-11-24 (×2): via INTRAVENOUS

## 2018-11-24 MED ORDER — PHENOL 1.4 % MT LIQD: 1.0000 | OROMUCOSAL | Status: DC | PRN

## 2018-11-24 MED ORDER — SODIUM CHLORIDE 0.9 % IV SOLN
INTRAVENOUS | Status: DC | PRN
  Administered 2018-11-24: 07:00:00

## 2018-11-24 MED ORDER — HYDROCHLOROTHIAZIDE 25 MG PO TABS
25.0000 mg | ORAL_TABLET | Freq: Every day | ORAL | Status: DC
  Administered 2018-11-24 – 2018-11-26 (×3): 25 mg via ORAL
  Filled 2018-11-24 (×3): qty 1

## 2018-11-24 MED ORDER — LABETALOL HCL 5 MG/ML IV SOLN: 10.0000 mg | INTRAVENOUS | Status: DC | PRN

## 2018-11-24 MED ORDER — 0.9 % SODIUM CHLORIDE (POUR BTL) OPTIME
TOPICAL | Status: DC | PRN
  Administered 2018-11-24: 2000 mL

## 2018-11-24 MED ORDER — LIDOCAINE 2% (20 MG/ML) 5 ML SYRINGE
INTRAMUSCULAR | Status: DC | PRN
  Administered 2018-11-24: 60 mg via INTRAVENOUS

## 2018-11-24 MED ORDER — SODIUM CHLORIDE 0.9 % IV SOLN
INTRAVENOUS | Status: DC
  Administered 2018-11-24: 07:00:00 via INTRAVENOUS

## 2018-11-24 MED ORDER — ONDANSETRON HCL 4 MG/2ML IJ SOLN
INTRAMUSCULAR | Status: AC
  Filled 2018-11-24: qty 2

## 2018-11-24 MED ORDER — HEPARIN (PORCINE) 25000 UT/250ML-% IV SOLN
INTRAVENOUS | Status: AC
  Filled 2018-11-24: qty 250

## 2018-11-24 MED ORDER — LINACLOTIDE 145 MCG PO CAPS
145.0000 ug | ORAL_CAPSULE | Freq: Every day | ORAL | Status: DC
  Administered 2018-11-25 – 2018-11-26 (×2): 145 ug via ORAL
  Filled 2018-11-24 (×2): qty 1

## 2018-11-24 MED ORDER — PROPOFOL 10 MG/ML IV BOLUS
INTRAVENOUS | Status: AC
  Filled 2018-11-24: qty 40

## 2018-11-24 MED ORDER — MAGNESIUM SULFATE 2 GM/50ML IV SOLN: 2.0000 g | Freq: Every day | INTRAVENOUS | Status: DC | PRN

## 2018-11-24 SURGICAL SUPPLY — 56 items
ADH SKN CLS APL DERMABOND .7 (GAUZE/BANDAGES/DRESSINGS) ×1
AGENT HMST SPONGE THK3/8 (HEMOSTASIS)
ARMBAND PINK RESTRICT EXTREMIT (MISCELLANEOUS) ×2 IMPLANT
CANISTER SUCT 3000ML PPV (MISCELLANEOUS) ×2 IMPLANT
CANNULA VESSEL 3MM 2 BLNT TIP (CANNULA) ×2 IMPLANT
CLIP VESOCCLUDE MED 24/CT (CLIP) ×2 IMPLANT
CLIP VESOCCLUDE MED 6/CT (CLIP) ×2 IMPLANT
CLIP VESOCCLUDE SM WIDE 24/CT (CLIP) ×2 IMPLANT
CLIP VESOCCLUDE SM WIDE 6/CT (CLIP) ×2 IMPLANT
CONNECTOR Y ATS VAC SYSTEM (MISCELLANEOUS) ×2 IMPLANT
COVER PROBE W GEL 5X96 (DRAPES) ×2 IMPLANT
COVER WAND RF STERILE (DRAPES) ×2 IMPLANT
DERMABOND ADVANCED (GAUZE/BANDAGES/DRESSINGS) ×1
DERMABOND ADVANCED .7 DNX12 (GAUZE/BANDAGES/DRESSINGS) ×1 IMPLANT
DRAIN CHANNEL 15F RND FF W/TCR (WOUND CARE) IMPLANT
ELECT CAUTERY BLADE 6.4 (BLADE) ×2 IMPLANT
ELECT REM PT RETURN 9FT ADLT (ELECTROSURGICAL) ×2
ELECTRODE REM PT RTRN 9FT ADLT (ELECTROSURGICAL) ×1 IMPLANT
EVACUATOR SILICONE 100CC (DRAIN) IMPLANT
GLOVE BIO SURGEON STRL SZ7.5 (GLOVE) ×2 IMPLANT
GLOVE BIOGEL PI IND STRL 6.5 (GLOVE) ×1 IMPLANT
GLOVE BIOGEL PI INDICATOR 6.5 (GLOVE) ×1
GLOVE SS BIOGEL STRL SZ 7 (GLOVE) ×1 IMPLANT
GLOVE SUPERSENSE BIOGEL SZ 7 (GLOVE) ×1
GOWN STRL REUS W/ TWL LRG LVL3 (GOWN DISPOSABLE) ×2 IMPLANT
GOWN STRL REUS W/ TWL XL LVL3 (GOWN DISPOSABLE) ×1 IMPLANT
GOWN STRL REUS W/TWL LRG LVL3 (GOWN DISPOSABLE) ×4
GOWN STRL REUS W/TWL XL LVL3 (GOWN DISPOSABLE) ×2
HEMOSTAT SPONGE AVITENE ULTRA (HEMOSTASIS) IMPLANT
INSERT FOGARTY SM (MISCELLANEOUS) ×2 IMPLANT
KIT BASIN OR (CUSTOM PROCEDURE TRAY) ×2 IMPLANT
KIT PREVENA INCISION MGT 13 (CANNISTER) ×4 IMPLANT
KIT TURNOVER KIT B (KITS) ×2 IMPLANT
NS IRRIG 1000ML POUR BTL (IV SOLUTION) ×4 IMPLANT
PACK CV ACCESS (CUSTOM PROCEDURE TRAY) ×2 IMPLANT
PACK PERIPHERAL VASCULAR (CUSTOM PROCEDURE TRAY) ×2 IMPLANT
PAD ARMBOARD 7.5X6 YLW CONV (MISCELLANEOUS) ×4 IMPLANT
PENCIL BUTTON HOLSTER BLD 10FT (ELECTRODE) ×2 IMPLANT
PUNCH AORTIC ROTATE 4.0MM (MISCELLANEOUS) ×2 IMPLANT
SUT MNCRL AB 4-0 PS2 18 (SUTURE) ×4 IMPLANT
SUT PROLENE 5 0 C 1 24 (SUTURE) ×8 IMPLANT
SUT PROLENE 6 0 BV (SUTURE) ×4 IMPLANT
SUT PROLENE 6 0 CC (SUTURE) ×10 IMPLANT
SUT SILK 3 0 (SUTURE) ×2
SUT SILK 3-0 18XBRD TIE 12 (SUTURE) ×1 IMPLANT
SUT VIC AB 2-0 CT1 27 (SUTURE) ×4
SUT VIC AB 2-0 CT1 TAPERPNT 27 (SUTURE) ×2 IMPLANT
SUT VIC AB 3-0 SH 27 (SUTURE) ×6
SUT VIC AB 3-0 SH 27X BRD (SUTURE) ×3 IMPLANT
TOWEL GREEN STERILE (TOWEL DISPOSABLE) ×2 IMPLANT
TRAY FOLEY MTR SLVR 16FR STAT (SET/KITS/TRAYS/PACK) ×2 IMPLANT
UNDERPAD 30X30 (UNDERPADS AND DIAPERS) ×2 IMPLANT
VEIN SAPHENOUS CRYO  61-70CM (Tissue) ×1 IMPLANT
VEIN SAPHENOUS CRYO 61-70CM (Tissue) ×1 IMPLANT
WATER STERILE IRR 1000ML POUR (IV SOLUTION) ×2 IMPLANT
WND VAC CANISTER 500ML (MISCELLANEOUS) ×2 IMPLANT

## 2018-11-24 NOTE — Op Note (Signed)
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 Donzetta Matters, MD Assistants: Ruta Hinds, MD; Arlee Muslim, [place] Procedure Performed: 1.  Left common femoral vein to right common femoral vein bypass with cryopreserved greater saphenous vein 5.5 mm in diameter 2.  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  Endovascular means of treatment have been attempted and were unsuccessful.  She is now indicated for left right femoral to femoral bypass graft with creation of AV fistula.  Findings: The common femoral vein on the right was patent and free of disease.  The saphenous vein measured approximately 5 mm in the groin but quickly branched until greatest diameter was 2 mm after approximately 8 cm length.  There were significant collateral veins in both groins that were preserved.  On the left side the external iliac vein appeared to be thrombosed.  Common femoral vein itself was patent.  Femoral vein and superficial femoral artery were free of disease and at completion of fistula creation there is a strong thrill.  After femoral to femoral vein bypass was created there was good signal in the vein on either groin side that did augment with compression of the vein from the inflow.   Procedure:  The patient was identified in the holding area and taken to the operating room where she is placed to bilateral table and general anesthesia was induced.  She was to the prepped and draped in her bilateral groins and thighs in the usual fashion, antibiotics were administered and a timeout was called.  We began  using ultrasound to evaluate the saphenous vein on the right which did not appear to be suitable.  We marked out her common femoral veins bilaterally and then made concomitant longitudinal incisions.  On the right side the common femoral vein was dissected out until a side-biting clamp could be placed around it.  The saphenofemoral junction was preserved.  On the left side there were significant amounts of collateralization.  We did identify the great saphenous vein and this was preserved and traced back to the level of the common femoral vein.  There was significant scarring in this region.  After we were able to do dissected out the common femoral vein and saphenofemoral junction and placed a side-biting clamp on it we then prepared our cryopreserved greater saphenous vein.  A tunnel was created between the 2 groins.  After the saphenous vein was prepared we then tunneled it marked of orientation and flushed it.  We did not want to open a channel between the 2 groins until her AV fistula was created.  Due to this we then sewed our right-sided anastomosis first.  Patient was fully heparinized 12,000 units of heparin.  Side-biting clamp was placed on the common femoral vein and it was open free of disease.  The vein was spatulated to approximately 1.5 cm and sewn end-to-side with 5-0 Prolene suture.  After completing this anastomosis we then flushed through the tunneled vein and flushed easily and we then reclamped just at the anastomosis.  We trimmed a distal aspect off of the cryopreserved vein.  Clamps were placed first on the superficial femoral artery 11 blade was  used to nick the artery.  A 4 mm arterial puncture was then used.  The vein was then sewn end-to-side to this.  We then released our clamps we had good flow through our small vein piece.  We then clamped our femoral vein again made a small nick and used 4 mm punch.  We then created AV fistula sewing the vein end-to-side with 6-0 Prolene suture.  Upon  completion was a palpable thrill in the AV fistula.  We then turned our attention to completing our anastomosis.  A side-biting clamp was placed on the common femoral vein on the left and this was opened.  There was a little bit of webbing as well as a valve that was removed.  The vein was spatulated to 1.5 cm which was 3 times the diameter of the vein.  We then sewed it into side with 5-0 Prolene suture.  This time prior to completion we did allow flushing all directions.  Upon completion we then had good signal through our vein on both groins that was augmented with compression of the vein inflow.  Satisfied with this we then obtained hemostasis irrigated all wounds and closed in layers with Vicryl in both running and interrupted fashion and Monocryl running.  Skin wound vacs were then placed above the skin.  She was allowed away from anesthesia having tolerated procedure without immediate complication.  All counts were correct at completion.  EBL 100 cc  Jane [name] C. Donzetta Matters, MD [facility] Office: [phone] Pager: [phone]

## 2018-11-24 NOTE — Anesthesia Preprocedure Evaluation (Signed)

## 2018-11-24 NOTE — H&P (Signed)
   History and Physical Update  The patient was interviewed and re-examined.  The patient's previous History and Physical has been reviewed and is unchanged from recent office visit. We again discussed risks and benefits and she agrees to proceed.   Jane [name] C. Donzetta Matters, MD [facility] Office: [phone] Pager: [phone]

## 2018-11-24 NOTE — Transfer of Care (Signed)
Immediate Anesthesia Transfer of Care Note  Patient: Jane [name]  Procedure(s) Performed: BYPASS GRAFT FEMORAL-FEMORAL VENOUS LEFT TO RIGHT PALMA PROCEDURE USING CRYOVEIN (Left ) ARTERIOVENOUS (AV) FISTULA CREATION LEFT SFA TO LEFT FEMORAL VEIN (Left )  Patient Location: PACU  Anesthesia Type:General  Level of Consciousness: awake, alert , oriented and sedated  Airway & Oxygen Therapy: Patient Spontanous Breathing and Patient connected to face mask oxygen  Post-op Assessment: Report given to RN, Post -op Vital signs reviewed and stable and Patient moving all extremities  Post vital signs: Reviewed and stable  Last Vitals:  Vitals Value Taken Time  BP 133/69 11/24/2018 12:14 PM  Temp    Pulse 108 11/24/2018 12:18 PM  Resp 16 11/24/2018 12:18 PM  SpO2 92 % 11/24/2018 12:18 PM  Vitals shown include unvalidated device data.  Last Pain:  Vitals:   11/24/18 0649  TempSrc:   PainSc: 0-No pain         Complications: No apparent anesthesia complications

## 2018-11-24 NOTE — Progress Notes (Signed)
ANTICOAGULATION CONSULT NOTE - Initial Consult  Pharmacy Consult for heparin (MD dosing)  Indication: s/p femoral artery bypass   Allergies  Allergen Reactions  . Aripiprazole Other (See Comments)    BECOMES  VIOLENT   . Seroquel [Quetiapine Fumarate] Other (See Comments)    BECOMES VIOLENT  . Chlorpromazine Other (See Comments)    SEVERE ANXIETY   . Gabapentin Other (See Comments)    NIGHTMARES     Patient Measurements: Height: 5\' 10"  (177.8 cm) Weight: 260 lb (117.9 kg) IBW/kg (Calculated) : 68.5 Heparin Dosing Weight: 95 kg   Vital Signs: Temp: 97.5 F (36.4 C) (01/21 1423) Temp Source: Oral (01/21 1423) BP: 153/80 (01/21 1423) Pulse Rate: 106 (01/21 1423)  Labs: No results for input(s): HGB, HCT, PLT, APTT, LABPROT, INR, HEPARINUNFRC, HEPRLOWMOCWT, CREATININE, CKTOTAL, CKMB, TROPONINI in the last 72 hours.  Estimated Creatinine Clearance: 69.5 mL/min (A) (by C-G formula based on SCr of 1.32 mg/dL (H)).   Medical History: Past Medical History:  Diagnosis Date  . Anxiety   . Bipolar disorder ([place])   . Chronic kidney disease    Stage 3 kidney disease;dx by Dr. Sinda Du.   . Depression   . GERD (gastroesophageal reflux disease)   . High cholesterol   . History of DVT (deep vein thrombosis)    left leg  . Hypertension    states under control with meds., has been on med. x 2 years  . Hypothyroidism   . Obsessive-compulsive disorder   . Restless leg syndrome   . Trigger thumb of left hand 01/2018  . Trigger thumb of right hand     Medications:  Medications Prior to Admission  Medication Sig Dispense Refill Last Dose  . ALPRAZolam (XANAX) 1 MG tablet Take 1 tablet (1 mg total) by mouth 2 (two) times daily as needed for anxiety. 60 tablet 2 11/24/2018 at 0430  . amLODipine (NORVASC) 10 MG tablet Take 10 mg by mouth daily.    Taking  . amphetamine-dextroamphetamine (ADDERALL) 30 MG tablet Take 1 tablet by mouth 2 (two) times daily. Fill after 10/27/18  60 tablet 0 Past Week at Unknown time  . aspirin EC 81 MG tablet Take 81 mg by mouth daily.   11/23/2018 at Unknown time  . aspirin-acetaminophen-caffeine (EXCEDRIN MIGRAINE) 250-250-65 MG tablet Take 2 tablets by mouth 2 (two) times daily as needed for headache or migraine.    Past Week at Unknown time  . atorvastatin (LIPITOR) 20 MG tablet Take 20 mg by mouth daily.   11/24/2018 at 0430  . cholecalciferol (VITAMIN D) 1000 units tablet Take 1,000 Units by mouth at bedtime.    Past Week at Unknown time  . cycloSPORINE (RESTASIS) 0.05 % ophthalmic emulsion Place 1 drop into both eyes 2 (two) times daily.   11/24/2018 at 0430  . DULoxetine (CYMBALTA) 60 MG capsule Take 1 capsule (60 mg total) by mouth 2 (two) times daily. 60 capsule 2 11/24/2018 at 0430  . ELDERBERRY PO Take 50 mg by mouth daily.   Past Week at Unknown time  . hydrochlorothiazide (HYDRODIURIL) 25 MG tablet Take 25 mg by mouth daily.   11/23/2018 at Unknown time  . HYDROcodone-acetaminophen (NORCO) 10-325 MG tablet Take 1 tablet by mouth every 6 (six) hours as needed (for pain.).   11/23/2018 at Unknown time  . KLOR-CON M10 10 MEQ tablet Take 10 mEq by mouth 3 (three) times daily.  12 11/23/2018 at Unknown time  . Krill Oil (OMEGA-3) 500 MG CAPS Take  500 mg by mouth at bedtime.   Past Week at Unknown time  . levothyroxine (SYNTHROID, LEVOTHROID) 137 MCG tablet Take 137 mcg by mouth daily before breakfast.   11/24/2018 at 0430  . linaclotide (LINZESS) 145 MCG CAPS capsule Take 145 mcg by mouth daily.    11/24/2018 at 0430  . Magnesium 250 MG TABS Take 250 mg by mouth daily.   Past Week at Unknown time  . Melatonin 10 MG TBDP Take 10 mg by mouth at bedtime as needed (for sleep).   Past Week at Unknown time  . Multiple Vitamin (MULTIVITAMIN WITH MINERALS) TABS tablet Take 1 tablet by mouth daily. One-A-Day Women's Multivitamin   Past Week at Unknown time  . pantoprazole (PROTONIX) 40 MG tablet Take 40 mg by mouth daily.   11/24/2018 at 0430  .  PAZEO 0.7 % SOLN Place 1 drop into both eyes 2 (two) times daily.    11/24/2018 at 0430  . Polyvinyl Alcohol-Povidone (REFRESH OP) Place 1 drop into both eyes 4 (four) times daily.   11/24/2018 at 0430  . potassium chloride (K-DUR) 10 MEQ tablet Take 10 mEq by mouth 3 (three) times daily.   11/23/2018 at Unknown time  . rOPINIRole (REQUIP) 2 MG tablet Take 2 mg by mouth 2 (two) times daily.   11/24/2018 at 0430  . cetirizine (ZYRTEC) 10 MG tablet Take 10 mg by mouth daily.   Unknown at Unknown time  . diclofenac sodium (VOLTAREN) 1 % GEL Apply 2-4 g topically 2 (two) times daily as needed (for foot pain.).   Unknown at Unknown time  . fluticasone (FLONASE) 50 MCG/ACT nasal spray Place 1 spray into both nostrils daily as needed for allergies or rhinitis.   Unknown at Unknown time  . ketoconazole (NIZORAL) 2 % cream Apply 1 application topically 2 (two) times daily as needed for irritation.    Unknown at Unknown time  . nystatin (MYCOSTATIN/NYSTOP) powder Apply 1 g topically 2 (two) times daily as needed (for skin irritation/rash).    Unknown at Unknown time  . zolpidem (AMBIEN) 10 MG tablet Take 1 tablet (10 mg total) by mouth at bedtime as needed for sleep. 30 tablet 2 Unknown at Unknown time    Assessment: 89 YOF with occluded left common and external iliac veins s/p common femoral vein bypass. Pharmacy consulted by vascular to start IV heparin at 800 units/hr immediately post op.     Plan:  -Will start IV heparin at 800 units/hr immediately post op -F/u CBC in AM -Vascular to manage further heparin dosing   Albertina Parr, PharmD., BCPS Clinical Pharmacist Clinical phone for 11/24/18 until 3:30pm: [phone] If after 3:30pm, please refer to [facility] for unit-specific pharmacist

## 2018-11-24 NOTE — Anesthesia Procedure Notes (Addendum)
Procedure Name: Intubation Date/Time: 11/24/2018 7:47 AM Performed by: Scheryl Darter, CRNA Pre-anesthesia Checklist: Patient identified, Emergency Drugs available, Suction available and Patient being monitored Patient Re-evaluated:Patient Re-evaluated prior to induction Oxygen Delivery Method: Circle System Utilized Preoxygenation: Pre-oxygenation with 100% oxygen Induction Type: IV induction Ventilation: Two handed mask ventilation required Laryngoscope Size: Glidescope and 4 Grade View: Grade III Tube type: Oral Tube size: 7.0 mm Number of attempts: 1 Airway Equipment and Method: Oral airway,  Rigid stylet and Video-laryngoscopy Placement Confirmation: ETT inserted through vocal cords under direct vision,  positive ETCO2 and breath sounds checked- equal and bilateral Secured at: 22 cm Tube secured with: Tape Dental Injury: Teeth and Oropharynx as per pre-operative assessment  Difficulty Due To: Difficulty was anticipated, Difficult Airway- due to large tongue, Difficult Airway- due to reduced neck mobility, Difficult Airway- due to limited oral opening and Difficult Airway- due to anterior larynx

## 2018-11-25 ENCOUNTER — Inpatient Hospital Stay (HOSPITAL_COMMUNITY): Payer: Medicaid Other

## 2018-11-25 ENCOUNTER — Encounter (HOSPITAL_COMMUNITY): Payer: Self-pay | Admitting: Vascular Surgery

## 2018-11-25 DIAGNOSIS — I871 Compression of vein: Secondary | ICD-10-CM

## 2018-11-25 LAB — CBC
HCT: 30.6 % — ABNORMAL LOW (ref 36.0–46.0)
Hemoglobin: 9.7 g/dL — ABNORMAL LOW (ref 12.0–15.0)
MCH: 28.4 pg (ref 26.0–34.0)
MCHC: 31.7 g/dL (ref 30.0–36.0)
MCV: 89.7 fL (ref 80.0–100.0)
Platelets: 186 10*3/uL (ref 150–400)
RBC: 3.41 MIL/uL — ABNORMAL LOW (ref 3.87–5.11)
RDW: 14.6 % (ref 11.5–15.5)
WBC: 12.7 10*3/uL — ABNORMAL HIGH (ref 4.0–10.5)
nRBC: 0 % (ref 0.0–0.2)

## 2018-11-25 LAB — BASIC METABOLIC PANEL
Anion gap: 10 (ref 5–15)
BUN: 29 mg/dL — ABNORMAL HIGH (ref 6–20)
CO2: 23 mmol/L (ref 22–32)
Calcium: 8.4 mg/dL — ABNORMAL LOW (ref 8.9–10.3)
Chloride: 106 mmol/L (ref 98–111)
Creatinine, Ser: 1.42 mg/dL — ABNORMAL HIGH (ref 0.44–1.00)
GFR calc non Af Amer: 42 mL/min — ABNORMAL LOW (ref >60)
GFR, EST AFRICAN AMERICAN: 49 mL/min — AB (ref >60)
Glucose, Bld: 176 mg/dL — ABNORMAL HIGH (ref 70–99)
Potassium: 3.3 mmol/L — ABNORMAL LOW (ref 3.5–5.1)
SODIUM: 139 mmol/L (ref 135–145)

## 2018-11-25 LAB — HEPARIN LEVEL (UNFRACTIONATED): Heparin Unfractionated: 0.21 IU/mL — ABNORMAL LOW (ref 0.30–0.70)

## 2018-11-25 MED ORDER — PROPOFOL 1000 MG/100ML IV EMUL
INTRAVENOUS | Status: AC
  Filled 2018-11-25: qty 400

## 2018-11-25 MED ORDER — HEPARIN (PORCINE) 25000 UT/250ML-% IV SOLN
1550.0000 [IU]/h | INTRAVENOUS | Status: DC
  Administered 2018-11-25 (×2): 1400 [IU]/h via INTRAVENOUS
  Filled 2018-11-25: qty 250

## 2018-11-25 MED ORDER — PROPOFOL 500 MG/50ML IV EMUL
INTRAVENOUS | Status: AC
  Filled 2018-11-25: qty 100

## 2018-11-25 NOTE — Progress Notes (Signed)
Bilateral lower extremity venous duplex with bypass graft duplex completed. Refer to "CV Proc" under chart review to view preliminary results.  11/25/2018 2:48 PM Maudry Mayhew, MHA, RVT, RDCS, RDMS

## 2018-11-25 NOTE — Evaluation (Signed)
Occupational Therapy Evaluation Patient Details Name: Jane [name] MRN: [id number] DOB: 02-12-1966 Today's Date: 11/25/2018    History of Present Illness Pt is a 53 y/o female s/p L femoral-femoral bypass graft on 1/21. PMH includes HTN.    Clinical Impression   Pt with decline in function and safety with ADLs and ADL mobility with decreased endurance and balance. Pt limited by pain from R LE incision and requires mod A with LB ADLs and min guard A for ADL mobility using RW. PTA, pt lived at home alone and was independent, driving and used no AD for mobility. Pt educated on ADL A/E for LB, compression sock aid, 3 in 1 and tub bench for home use. Pt would benefit from acute OT services to address impairments to maximize level of function and safety    Follow Up Recommendations  Supervision - Intermittent    Equipment Recommendations  3 in 1 bedside commode;Other (comment)(LH bath sponge, compression sock donner)    Recommendations for Other Services       Precautions / Restrictions Precautions Precautions: Fall Restrictions Weight Bearing Restrictions: No      Mobility Bed Mobility Overal bed mobility: Needs Assistance Bed Mobility: Supine to Sit     Supine to sit: Supervision     General bed mobility comments: pt sitting EOB upon arrival  Transfers Overall transfer level: Needs assistance Equipment used: Rolling walker (2 wheeled) Transfers: Sit to/from Stand Sit to Stand: Min guard         General transfer comment: Min guard for safety. Cues for safe hand placement.     Balance Overall balance assessment: Needs assistance Sitting-balance support: No upper extremity supported;Feet supported Sitting balance-Leahy Scale: Fair     Standing balance support: Bilateral upper extremity supported;During functional activity Standing balance-Leahy Scale: Poor Standing balance comment: Reliant on BUE support                            ADL either  performed or assessed with clinical judgement   ADL Overall ADL's : Needs assistance/impaired     Grooming: Wash/dry hands;Wash/dry face;Standing;Supervision/safety   Upper Body Bathing: Modified independent   Lower Body Bathing: Moderate assistance   Upper Body Dressing : Modified independent   Lower Body Dressing: Moderate assistance   Toilet Transfer: Min guard;Ambulation;RW;Comfort height toilet;Grab bars   Toileting- Clothing Manipulation and Hygiene: Min guard;Sit to/from stand   Tub/ Shower Transfer: Min guard;Ambulation;Rolling walker;3 in 1;Grab bars   Functional mobility during ADLs: Min guard General ADL Comments: pt requires assist with LB ADLs due to pain from incision     Vision Patient Visual Report: No change from baseline       Perception     Praxis      Pertinent Vitals/Pain Pain Assessment: 0-10 Pain Score: 3  Faces Pain Scale: Hurts little more Pain Location: LLE  Pain Descriptors / Indicators: Aching;Operative site guarding Pain Intervention(s): Limited activity within patient's tolerance;Monitored during session;Repositioned     Hand Dominance Right   Extremity/Trunk Assessment Upper Extremity Assessment Upper Extremity Assessment: Overall WFL for tasks assessed   Lower Extremity Assessment Lower Extremity Assessment: Defer to PT evaluation LLE Deficits / Details: Deficits consistent with post op pain and weakness.    Cervical / Trunk Assessment Cervical / Trunk Assessment: Normal   Communication Communication Communication: No difficulties   Cognition Arousal/Alertness: Awake/alert Behavior During Therapy: WFL for tasks assessed/performed Overall Cognitive Status: Within Functional Limits for tasks assessed  General Comments  Pt's friend present during session     Exercises     Shoulder [place] expects to be discharged to:: Private  residence Living Arrangements: Alone Available Help at Discharge: Friend(s);Family;Available 24 hours/day Type of Home: House Home Access: Stairs to enter [employer] of Steps: 6-7 Entrance Stairs-Rails: Left Home Layout: One level     Bathroom Shower/Tub: Teacher, early years/pre: Standard     Home Equipment: None          Prior Functioning/Environment Level of Independence: Independent                 OT Problem List: Decreased activity tolerance;Decreased knowledge of use of DME or AE;Pain      OT Treatment/Interventions: Self-care/ADL training;DME and/or AE instruction;Therapeutic activities;Patient/family education    OT Goals(Current goals can be found in the care plan section) Acute Rehab OT Goals Patient Stated Goal: to go home OT Goal Formulation: With patient Time For Goal Achievement: 12/09/18 Potential to Achieve Goals: Good ADL Goals Pt Will Perform Grooming: with supervision;with set-up;with modified independence;standing Pt Will Perform Lower Body Bathing: with min assist;with adaptive equipment Pt Will Perform Lower Body Dressing: with min assist;with caregiver independent in assisting Pt Will Transfer to Toilet: with supervision;with modified independence;ambulating Pt Will Perform Toileting - Clothing Manipulation and hygiene: with supervision;with modified independence;sit to/from stand  OT Frequency: Min 2X/week   Barriers to D/C:    no barriers       Co-evaluation              AM-PAC OT "6 Clicks" Daily Activity     Outcome Measure Help from another person eating meals?: None Help from another person taking care of personal grooming?: None Help from another person toileting, which includes using toliet, bedpan, or urinal?: A Little Help from another person bathing (including washing, rinsing, drying)?: A Little Help from another person to put on and taking off regular upper body clothing?: None Help from  another person to put on and taking off regular lower body clothing?: A Little 6 Click Score: 21   End of Session Equipment Utilized During Treatment: Rolling walker;Other (comment)(3 in 1)  Activity Tolerance: Patient tolerated treatment well Patient left: in bed(sitting EOB )  OT Visit Diagnosis: Other abnormalities of gait and mobility (R26.89);Pain Pain - Right/Left: Right Pain - part of body: Leg                Time: 7412-8786 OT Time Calculation (min): 24 min Charges:  OT General Charges $OT Visit: 1 Visit OT Evaluation $OT Eval Low Complexity: 1 Low OT Treatments $Self Care/Home Management : 8-22 mins    Britt Bottom 11/25/2018, 1:05 PM

## 2018-11-25 NOTE — Progress Notes (Signed)
Wound vac was alarming d/t broken connector. Ordered new dressing kits for the wound vac. Removed old dressing to change, discovered the BL incisions were clean, dry, and closed. L groin had mild bruising around site. There was old drainage present, however, the incisions were clean. 4x4 gauzes placed with medipore tape. Will monitor closely.    Fransico Michael, RN

## 2018-11-25 NOTE — Evaluation (Signed)
Physical Therapy Evaluation Patient Details Name: Jane [name] MRN: [id number] DOB: 1966-01-21 Today's Date: 11/25/2018   History of Present Illness  Pt is a 53 y/o female s/p L femoral-femoral bypass graft on 1/21. PMH includes HTN.   Clinical Impression  Pt is s/p surgery above with deficits below. Pt requiring min guard to supervision for mobility tasks using RW. Some fatigue and SOB noted with mobility, however, oxygen sats at 92% on RA at end of ambulation. Reports friend will be staying with her at d/c. Will continue to follow acutely to maximize functional mobility independence and safety.     Follow Up Recommendations Home health PT;Supervision for mobility/OOB    Equipment Recommendations  Rolling walker with 5" wheels;3in1 (PT)    Recommendations for Other Services       Precautions / Restrictions Precautions Precautions: Fall Restrictions Weight Bearing Restrictions: No      Mobility  Bed Mobility Overal bed mobility: Needs Assistance Bed Mobility: Supine to Sit     Supine to sit: Supervision     General bed mobility comments: Supervision for safety. Increased time required to perform.   Transfers Overall transfer level: Needs assistance Equipment used: Rolling walker (2 wheeled) Transfers: Sit to/from Stand Sit to Stand: Min guard         General transfer comment: Min guard for safety. Cues for safe hand placement.   Ambulation/Gait Ambulation/Gait assistance: Min guard;Supervision Gait Distance (Feet): 100 Feet Assistive device: Rolling walker (2 wheeled) Gait Pattern/deviations: Step-through pattern;Decreased step length - right;Decreased step length - left;Decreased weight shift to left;Antalgic Gait velocity: Decreased    General Gait Details: Slow, mildly antalgic gait. Cues for upright posture. Pt reports increased comfort with use of RW. Some fatigue and SOB noted, however, oxygen sats at 92% on RA following ambulation   Stairs             Wheelchair Mobility    Modified Rankin (Stroke Patients Only)       Balance Overall balance assessment: Needs assistance Sitting-balance support: No upper extremity supported;Feet supported Sitting balance-Leahy Scale: Fair     Standing balance support: Bilateral upper extremity supported;During functional activity Standing balance-Leahy Scale: Poor Standing balance comment: Reliant on BUE support                              Pertinent Vitals/Pain Pain Assessment: Faces Faces Pain Scale: Hurts little more Pain Location: LLE  Pain Descriptors / Indicators: Aching;Operative site guarding Pain Intervention(s): Limited activity within patient's tolerance;Monitored during session;Repositioned    Home Living Family/patient expects to be discharged to:: Private residence Living Arrangements: Alone Available Help at Discharge: Friend(s);Family;Available 24 hours/day Type of Home: House Home Access: Stairs to enter Entrance Stairs-Rails: Left Entrance Stairs-Number of Steps: 6-7 Home Layout: One level Home Equipment: None      Prior Function Level of Independence: Independent               Hand Dominance        Extremity/Trunk Assessment   Upper Extremity Assessment Upper Extremity Assessment: Defer to OT evaluation    Lower Extremity Assessment Lower Extremity Assessment: LLE deficits/detail LLE Deficits / Details: Deficits consistent with post op pain and weakness.     Cervical / Trunk Assessment Cervical / Trunk Assessment: Normal  Communication   Communication: No difficulties  Cognition Arousal/Alertness: Awake/alert Behavior During Therapy: WFL for tasks assessed/performed Overall Cognitive Status: Within Functional Limits for tasks assessed  General Comments General comments (skin integrity, edema, etc.): Pt's friend present during session     Exercises      Assessment/Plan    PT Assessment Patient needs continued PT services  PT Problem List Decreased activity tolerance;Decreased mobility;Decreased knowledge of use of DME;Pain       PT Treatment Interventions DME instruction;Gait training;Stair training;Functional mobility training;Therapeutic activities;Therapeutic exercise;Patient/family education    PT Goals (Current goals can be found in the Care Plan section)  Acute Rehab PT Goals Patient Stated Goal: to go home PT Goal Formulation: With patient Time For Goal Achievement: 12/09/18 Potential to Achieve Goals: Good    Frequency Min 3X/week   Barriers to discharge        Co-evaluation               AM-PAC PT "6 Clicks" Mobility  Outcome Measure Help needed turning from your back to your side while in a flat bed without using bedrails?: None Help needed moving from lying on your back to sitting on the side of a flat bed without using bedrails?: None Help needed moving to and from a bed to a chair (including a wheelchair)?: A Little Help needed standing up from a chair using your arms (e.g., wheelchair or bedside chair)?: A Little Help needed to walk in hospital room?: A Little Help needed climbing 3-5 steps with a railing? : A Lot 6 Click Score: 19    End of Session Equipment Utilized During Treatment: Gait belt Activity Tolerance: Patient tolerated treatment well Patient left: in bed;with call bell/phone within reach(sitting EOB with OT present ) Nurse Communication: Mobility status;Other (comment)(pt unable to void) PT Visit Diagnosis: Other abnormalities of gait and mobility (R26.89);Pain Pain - Right/Left: Left Pain - part of body: Leg    Time: 1914-7829 PT Time Calculation (min) (ACUTE ONLY): 26 min   Charges:   PT Evaluation $PT Eval Low Complexity: 1 Low PT Treatments $Gait Training: 8-22 mins        Leighton Ruff, PT, DPT  Acute Rehabilitation Services  Pager: [phone] Office:  [phone]   Rudean Hitt 11/25/2018, 12:04 PM

## 2018-11-25 NOTE — Progress Notes (Addendum)
  Progress Note    11/25/2018 7:19 AM 1 Day Post-Op  Subjective:  Says she's sore.    Afebrile HR 80's-100's  759'F-638'G systolic 66% 5LD3TT  Vitals:   11/24/18 1900 11/25/18 0434  BP: (!) 142/89 122/73  Pulse:  86  Resp: 14 16  Temp: 98.5 F (36.9 C) 98.2 F (36.8 C)  SpO2: 94% 97%    Physical Exam: General:  No distress Lungs:  Non labored Incisions:  Bilateral groin incisions with ecchymosis; clean and dry.  Pravena vac removed last night by RN   CBC    Component Value Date/Time   WBC 8.9 11/17/2018 1319   RBC 5.10 11/17/2018 1319   HGB 14.3 11/17/2018 1319   HCT 45.1 11/17/2018 1319   PLT 241 11/17/2018 1319   MCV 88.4 11/17/2018 1319   MCH 28.0 11/17/2018 1319   MCHC 31.7 11/17/2018 1319   RDW 13.7 11/17/2018 1319    BMET    Component Value Date/Time   NA 140 11/17/2018 1319   K 3.2 (L) 11/17/2018 1319   CL 98 11/17/2018 1319   CO2 26 11/17/2018 1319   GLUCOSE 109 (H) 11/17/2018 1319   BUN 25 (H) 11/17/2018 1319   CREATININE 1.32 (H) 11/17/2018 1319   CALCIUM 9.8 11/17/2018 1319   GFRNONAA 46 (L) 11/17/2018 1319   GFRAA 54 (L) 11/17/2018 1319    INR    Component Value Date/Time   INR 0.95 11/17/2018 1319     Intake/Output Summary (Last 24 hours) at 11/25/2018 0719 Last data filed at 11/24/2018 2233 Gross per 24 hour  Intake 1855.55 ml  Output 1650 ml  Net 205.55 ml     Assessment:  53 y.o. female is s/p:  1.  Left common femoral vein to right common femoral vein bypass with cryopreserved greater saphenous vein 5.5 mm in diameter 2.  Creation of left superficial femoral artery to left femoral vein AV fistula with interposition cryopreserved saphenous vein  1 Day Post-Op  Plan: -bilateral groin incisions are clean and dry.  Pravena vac taken off last night by RN.  Will probably place new Pravena on for incisional vac. -for duplex this am -DVT prophylaxis:  Heparin gtt   Leontine Locket, PA-C [facility] [phone] 11/25/2018 7:19 AM  I have independently interviewed and examined patient and agree with PA assessment and plan above.  There is a strong pulsatile signal above her pubis.  Her groin incisions are intact although the skin wound vacs were removed for malfunction.  We will check a duplex today.  Transition to full dose anticoagulation and will need oral anticoagulation for discharge home.  Out of bed as tolerated.  Place compression stocking to left lower extremity and I have instructed her to use her pneumatic compression pump on her left lower extremity when she is in bed.   C. Donzetta Matters, MD [facility] [place] [facility] Office: [phone] Pager: [phone]

## 2018-11-25 NOTE — Progress Notes (Signed)
ANTICOAGULATION CONSULT NOTE  Pharmacy Consult for heparin  Indication: s/p femoral artery bypass   Allergies  Allergen Reactions  . Aripiprazole Other (See Comments)    BECOMES  VIOLENT   . Seroquel [Quetiapine Fumarate] Other (See Comments)    BECOMES VIOLENT  . Chlorpromazine Other (See Comments)    SEVERE ANXIETY   . Gabapentin Other (See Comments)    NIGHTMARES     Patient Measurements: Height: 5\' 10"  (177.8 cm) Weight: 260 lb (117.9 kg) IBW/kg (Calculated) : 68.5 Heparin Dosing Weight: 95 kg   Vital Signs: Temp: 98.3 F (36.8 C) (01/22 1948) Temp Source: Oral (01/22 1948) BP: 154/75 (01/22 1948)  Labs: Recent Labs    11/25/18 0817 11/25/18 0848 11/25/18 1920  HGB  --  9.7*  --   HCT  --  30.6*  --   PLT  --  186  --   HEPARINUNFRC  --   --  0.21*  CREATININE 1.42*  --   --     Estimated Creatinine Clearance: 64.6 mL/min (A) (by C-G formula based on SCr of 1.42 mg/dL (H)).   Medical History: Past Medical History:  Diagnosis Date  . Anxiety   . Bipolar disorder ([place])   . Chronic kidney disease    Stage 3 kidney disease;dx by Dr. Sinda Du.   . Depression   . GERD (gastroesophageal reflux disease)   . High cholesterol   . History of DVT (deep vein thrombosis)    left leg  . Hypertension    states under control with meds., has been on med. x 2 years  . Hypothyroidism   . Obsessive-compulsive disorder   . Restless leg syndrome   . Trigger thumb of left hand 01/2018  . Trigger thumb of right hand     Medications:  Medications Prior to Admission  Medication Sig Dispense Refill Last Dose  . ALPRAZolam (XANAX) 1 MG tablet Take 1 tablet (1 mg total) by mouth 2 (two) times daily as needed for anxiety. 60 tablet 2 11/24/2018 at 0430  . amLODipine (NORVASC) 10 MG tablet Take 10 mg by mouth daily.    Taking  . amphetamine-dextroamphetamine (ADDERALL) 30 MG tablet Take 1 tablet by mouth 2 (two) times daily. Fill after 10/27/18 60 tablet 0 Past Week  at Unknown time  . aspirin EC 81 MG tablet Take 81 mg by mouth daily.   11/23/2018 at Unknown time  . aspirin-acetaminophen-caffeine (EXCEDRIN MIGRAINE) 250-250-65 MG tablet Take 2 tablets by mouth 2 (two) times daily as needed for headache or migraine.    Past Week at Unknown time  . atorvastatin (LIPITOR) 20 MG tablet Take 20 mg by mouth daily.   11/24/2018 at 0430  . cholecalciferol (VITAMIN D) 1000 units tablet Take 1,000 Units by mouth at bedtime.    Past Week at Unknown time  . cycloSPORINE (RESTASIS) 0.05 % ophthalmic emulsion Place 1 drop into both eyes 2 (two) times daily.   11/24/2018 at 0430  . DULoxetine (CYMBALTA) 60 MG capsule Take 1 capsule (60 mg total) by mouth 2 (two) times daily. 60 capsule 2 11/24/2018 at 0430  . ELDERBERRY PO Take 50 mg by mouth daily.   Past Week at Unknown time  . hydrochlorothiazide (HYDRODIURIL) 25 MG tablet Take 25 mg by mouth daily.   11/23/2018 at Unknown time  . HYDROcodone-acetaminophen (NORCO) 10-325 MG tablet Take 1 tablet by mouth every 6 (six) hours as needed (for pain.).   11/23/2018 at Unknown time  . KLOR-CON M10  10 MEQ tablet Take 10 mEq by mouth 3 (three) times daily.  12 11/23/2018 at Unknown time  . Krill Oil (OMEGA-3) 500 MG CAPS Take 500 mg by mouth at bedtime.   Past Week at Unknown time  . levothyroxine (SYNTHROID, LEVOTHROID) 137 MCG tablet Take 137 mcg by mouth daily before breakfast.   11/24/2018 at 0430  . linaclotide (LINZESS) 145 MCG CAPS capsule Take 145 mcg by mouth daily.    11/24/2018 at 0430  . Magnesium 250 MG TABS Take 250 mg by mouth daily.   Past Week at Unknown time  . Melatonin 10 MG TBDP Take 10 mg by mouth at bedtime as needed (for sleep).   Past Week at Unknown time  . Multiple Vitamin (MULTIVITAMIN WITH MINERALS) TABS tablet Take 1 tablet by mouth daily. One-A-Day Women's Multivitamin   Past Week at Unknown time  . pantoprazole (PROTONIX) 40 MG tablet Take 40 mg by mouth daily.   11/24/2018 at 0430  . PAZEO 0.7 % SOLN Place 1  drop into both eyes 2 (two) times daily.    11/24/2018 at 0430  . Polyvinyl Alcohol-Povidone (REFRESH OP) Place 1 drop into both eyes 4 (four) times daily.   11/24/2018 at 0430  . potassium chloride (K-DUR) 10 MEQ tablet Take 10 mEq by mouth 3 (three) times daily.   11/23/2018 at Unknown time  . rOPINIRole (REQUIP) 2 MG tablet Take 2 mg by mouth 2 (two) times daily.   11/24/2018 at 0430  . cetirizine (ZYRTEC) 10 MG tablet Take 10 mg by mouth daily.   Unknown at Unknown time  . diclofenac sodium (VOLTAREN) 1 % GEL Apply 2-4 g topically 2 (two) times daily as needed (for foot pain.).   Unknown at Unknown time  . fluticasone (FLONASE) 50 MCG/ACT nasal spray Place 1 spray into both nostrils daily as needed for allergies or rhinitis.   Unknown at Unknown time  . ketoconazole (NIZORAL) 2 % cream Apply 1 application topically 2 (two) times daily as needed for irritation.    Unknown at Unknown time  . nystatin (MYCOSTATIN/NYSTOP) powder Apply 1 g topically 2 (two) times daily as needed (for skin irritation/rash).    Unknown at Unknown time  . zolpidem (AMBIEN) 10 MG tablet Take 1 tablet (10 mg total) by mouth at bedtime as needed for sleep. 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 Pharmacy consulted by vascular to start IV heparin at 800 units/hr immediately post op, was increased to 1400 units/hr earlier today.  Heparin level this evening slightly below goal.  No overt bleeding or complications noted.  Goal of therapy: Heparin level 0.3-0.7 Monitor platelets per protocol.     Plan:  -Increase IV heparin to 1550 units/hr. -Recheck heparin level in 6 hrs. -Daily heparin level and CBC.  Marguerite Olea, [facility] Clinical Pharmacist Phone [phone]  11/25/2018 8:40 PM

## 2018-11-26 LAB — CBC
HCT: 25.8 % — ABNORMAL LOW (ref 36.0–46.0)
Hemoglobin: 8.4 g/dL — ABNORMAL LOW (ref 12.0–15.0)
MCH: 29.5 pg (ref 26.0–34.0)
MCHC: 32.6 g/dL (ref 30.0–36.0)
MCV: 90.5 fL (ref 80.0–100.0)
NRBC: 0 % (ref 0.0–0.2)
Platelets: 167 10*3/uL (ref 150–400)
RBC: 2.85 MIL/uL — ABNORMAL LOW (ref 3.87–5.11)
RDW: 14.7 % (ref 11.5–15.5)
WBC: 11.8 10*3/uL — ABNORMAL HIGH (ref 4.0–10.5)

## 2018-11-26 LAB — HEPARIN LEVEL (UNFRACTIONATED): Heparin Unfractionated: 0.31 IU/mL (ref 0.30–0.70)

## 2018-11-26 MED ORDER — RIVAROXABAN (XARELTO) VTE STARTER PACK (15 & 20 MG): ORAL_TABLET | ORAL | 0 refills | Status: DC

## 2018-11-26 MED ORDER — RIVAROXABAN 20 MG PO TABS: 20.0000 mg | ORAL_TABLET | Freq: Every day | ORAL | 5 refills | Status: DC

## 2018-11-26 MED ORDER — RIVAROXABAN 20 MG PO TABS: 20.0000 mg | ORAL_TABLET | Freq: Every day | ORAL | Status: DC

## 2018-11-26 MED ORDER — RIVAROXABAN 15 MG PO TABS: 15.0000 mg | ORAL_TABLET | Freq: Two times a day (BID) | ORAL | Status: DC

## 2018-11-26 MED FILL — XARELTO STARTER PACK: 15 & 20 | 30 days supply | Qty: 51 | Fill #0

## 2018-11-26 NOTE — Anesthesia Postprocedure Evaluation (Signed)
Anesthesia Post Note  Patient: Jane [name]  Procedure(s) Performed: BYPASS GRAFT FEMORAL-FEMORAL VENOUS LEFT TO RIGHT PALMA PROCEDURE USING CRYOVEIN (Left ) ARTERIOVENOUS (AV) FISTULA CREATION LEFT SFA TO LEFT FEMORAL VEIN (Left )     Patient location during evaluation: PACU Anesthesia Type: General Level of consciousness: awake and alert Pain management: pain level controlled Vital Signs Assessment: post-procedure vital signs reviewed and stable Respiratory status: spontaneous breathing, nonlabored ventilation, respiratory function stable and patient connected to nasal cannula oxygen Cardiovascular status: blood pressure returned to baseline and stable Postop Assessment: no apparent nausea or vomiting Anesthetic complications: no    Last Vitals:  Vitals:   11/26/18 0545 11/26/18 0548  BP: 116/70   Pulse: 96   Resp: 16   Temp:  36.9 C  SpO2: (!) 85%     Last Pain:  Vitals:   11/26/18 0651  TempSrc:   PainSc: Asleep                 Branndon Tuite COKER

## 2018-11-26 NOTE — Progress Notes (Signed)
Referral received regarding Doacs- both Eliquis and Xarelto are covered under Medicaid benefits as preferred drugs.

## 2018-11-26 NOTE — Discharge Summary (Signed)
Discharge Summary    Jane [name] 1966-09-06 53 y.o. female  [id number]  Admission Date: 11/24/2018  Discharge Date: 11/26/2018  Physician: Thomes Lolling*  Admission Diagnosis: POST THROMBOLYTIC SYNDROME   HPI:   This is a 53 y.o. 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  She would best be served with palma procedure we will do this with femoral vein from the left to right and also add a AV fistula.  Possible we could use the saphenous vein at the time of surgery from the right leg but I believe this will be marginal in size as we get further down the leg but we will evaluate with ultrasound.  We will put her on oral anticoagulation following this procedure for some time she will also need to wear compression stockings continue walking.  I discussed with her that this may not last for the entirety of her life and also that she will not be fully healed from this but may be have a ball to score from 16 into a more mild range less than 10.  She demonstrates very good understanding we will get her scheduled today  Hospital Course:  The patient was admitted to the hospital and taken to the operating room on 11/24/2018 and underwent: 1.  Left common femoral vein to right common femoral vein bypass with cryopreserved greater saphenous vein 5.5 mm in diameter 2.  Creation of left superficial femoral artery to left femoral vein AV fistula with interposition cryopreserved saphenous vein    Findings: The common femoral vein on the right was patent and free of disease.  The saphenous vein measured approximately 5 mm in the groin but quickly branched until greatest diameter was 2 mm after approximately 8 cm length.  There were significant collateral veins in both groins that were preserved.  On the left side the external iliac vein appeared to be thrombosed.  Common femoral vein itself was patent.  Femoral vein and  superficial femoral artery were free of disease and at completion of fistula creation there is a strong thrill.  After femoral to femoral vein bypass was created there was good signal in the vein on either groin side that did augment with compression of the vein from the inflow.  The pt tolerated the procedure well and was transported to the PACU in good condition.   By POD 1, she remained on heparin.  Her Pravena vacs were taken off overnight by the RN.  She did have a duplex of her femoral to femoral vein bypass, which revealed it was patent.  Also: Right: There is no evidence of deep vein thrombosis in the lower extremity. However, portions of this examination were limited- see technologist comments above. No cystic structure found in the popliteal fossa. Left: There is no evidence of deep vein thrombosis in the lower extremity. However, portions of this examination were limited- see technologist comments above. No cystic structure found in the popliteal fossa.   She was discharged later that day after being transitioned from heparin gtt to Xarelto.   The remainder of the hospital course consisted of increasing mobilization and increasing intake of solids without difficulty.  CBC    Component Value Date/Time   WBC 11.8 (H) 11/26/2018 0347   RBC 2.85 (L) 11/26/2018 0347   HGB 8.4 (L) 11/26/2018 0347   HCT 25.8 (L) 11/26/2018 0347   PLT 167 11/26/2018 0347   MCV 90.5 11/26/2018 0347  MCH 29.5 11/26/2018 0347   MCHC 32.6 11/26/2018 0347   RDW 14.7 11/26/2018 0347    BMET    Component Value Date/Time   NA 139 11/25/2018 0817   K 3.3 (L) 11/25/2018 0817   CL 106 11/25/2018 0817   CO2 23 11/25/2018 0817   GLUCOSE 176 (H) 11/25/2018 0817   BUN 29 (H) 11/25/2018 0817   CREATININE 1.42 (H) 11/25/2018 0817   CALCIUM 8.4 (L) 11/25/2018 0817   GFRNONAA 42 (L) 11/25/2018 0817   GFRAA 49 (L) 11/25/2018 0817      Discharge Instructions    Discharge patient   Complete by:  As  directed    Discharge pt after Xarelto has been delivered from transition pharmacy   Discharge disposition:  01-Home or Self Care   Discharge patient date:  11/26/2018      Discharge Diagnosis:  POST THROMBOLYTIC SYNDROME  Secondary Diagnosis: Patient Active Problem List   Diagnosis Date Noted  . 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 Chronic venous insufficiency 11/21/2017  . Radicular low back pain 05/25/2014  . Depression, major, recurrent ([place]) 06/17/2013  . OCD (obsessive compulsive disorder) 11/20/2012  . 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 Bipolar 1 disorder ([place]) 09/25/2012  . DVT (deep venous thrombosis) ([place]) 08/18/2012   Past Medical History:  Diagnosis Date  . Anxiety   . Bipolar disorder ([place])   . Chronic kidney disease    Stage 3 kidney disease;dx by Dr. Sinda Du.   . Depression   . GERD (gastroesophageal reflux disease)   . High cholesterol   . History of DVT (deep vein thrombosis)    left leg  . Hypertension    states under control with meds., has been on med. x 2 years  . Hypothyroidism   . Obsessive-compulsive disorder   . Restless leg syndrome   . Trigger thumb of left hand 01/2018  . Trigger thumb of right hand      Allergies as of 11/26/2018      Reactions   Aripiprazole Other (See Comments)   BECOMES  VIOLENT   Seroquel [quetiapine Fumarate] Other (See Comments)   BECOMES VIOLENT   Chlorpromazine Other (See Comments)   SEVERE ANXIETY   Gabapentin Other (See Comments)   NIGHTMARES      Medication List    TAKE these medications   ALPRAZolam 1 MG tablet Commonly known as:  XANAX Take 1 tablet (1 mg total) by mouth 2 (two) times daily as needed for anxiety.   amLODipine 10 MG tablet Commonly known as:  NORVASC Take 10 mg by mouth daily.   amphetamine-dextroamphetamine 30 MG tablet Commonly known as:  ADDERALL Take 1 tablet by mouth 2 (two) times daily. Fill after 10/27/18    aspirin EC 81 MG tablet Take 81 mg by mouth daily.   aspirin-acetaminophen-caffeine 250-250-65 MG tablet Commonly known as:  EXCEDRIN MIGRAINE Take 2 tablets by mouth 2 (two) times daily as needed for headache or migraine.   atorvastatin 20 MG tablet Commonly known as:  LIPITOR Take 20 mg by mouth daily.   cetirizine 10 MG tablet Commonly known as:  ZYRTEC Take 10 mg by mouth daily.   cholecalciferol 1000 units tablet Commonly known as:  VITAMIN D Take 1,000 Units by mouth at bedtime.   cycloSPORINE 0.05 % ophthalmic emulsion Commonly known as:  RESTASIS Place 1 drop into both eyes 2 (two) times daily.   diclofenac sodium 1 % Gel Commonly  known as:  VOLTAREN Apply 2-4 g topically 2 (two) times daily as needed (for foot pain.).   DULoxetine 60 MG capsule Commonly known as:  CYMBALTA Take 1 capsule (60 mg total) by mouth 2 (two) times daily.   ELDERBERRY PO Take 50 mg by mouth daily.   fluticasone 50 MCG/ACT nasal spray Commonly known as:  FLONASE Place 1 spray into both nostrils daily as needed for allergies or rhinitis.   hydrochlorothiazide 25 MG tablet Commonly known as:  HYDRODIURIL Take 25 mg by mouth daily.   HYDROcodone-acetaminophen 10-325 MG tablet Commonly known as:  NORCO Take 1 tablet by mouth every 6 (six) hours as needed (for pain.).   ketoconazole 2 % cream Commonly known as:  NIZORAL Apply 1 application topically 2 (two) times daily as needed for irritation.   KLOR-CON M10 10 MEQ tablet Generic drug:  potassium chloride Take 10 mEq by mouth 3 (three) times daily.   levothyroxine 137 MCG tablet Commonly known as:  SYNTHROID, LEVOTHROID Take 137 mcg by mouth daily before breakfast.   linaclotide 145 MCG Caps capsule Commonly known as:  LINZESS Take 145 mcg by mouth daily.   Magnesium 250 MG Tabs Take 250 mg by mouth daily.   Melatonin 10 MG Tbdp Take 10 mg by mouth at bedtime as needed (for sleep).   multivitamin with minerals Tabs  tablet Take 1 tablet by mouth daily. One-A-Day Women's Multivitamin   nystatin powder Commonly known as:  MYCOSTATIN/NYSTOP Apply 1 g topically 2 (two) times daily as needed (for skin irritation/rash).   Omega-3 500 MG Caps Take 500 mg by mouth at bedtime.   pantoprazole 40 MG tablet Commonly known as:  PROTONIX Take 40 mg by mouth daily.   PAZEO 0.7 % Soln Generic drug:  Olopatadine HCl Place 1 drop into both eyes 2 (two) times daily.   potassium chloride 10 MEQ tablet Commonly known as:  K-DUR Take 10 mEq by mouth 3 (three) times daily.   REFRESH OP Place 1 drop into both eyes 4 (four) times daily.   Rivaroxaban 15 & 20 MG Tbpk Take as directed on package: Start with one 15mg  tablet by mouth twice a day with food. On Day 22, switch to one 20mg  tablet once a day with food.   rivaroxaban 20 MG Tabs tablet Commonly known as:  XARELTO Take 1 tablet (20 mg total) by mouth daily with supper. Start taking on:  December 17, 2018   rOPINIRole 2 MG tablet Commonly known as:  REQUIP Take 2 mg by mouth 2 (two) times daily.   zolpidem 10 MG tablet Commonly known as:  AMBIEN Take 1 tablet (10 mg total) by mouth at bedtime as needed for sleep.            Durable Medical Equipment  (From admission, onward)         Start     Ordered   11/26/18 1102  For home use only DME Walker rolling  Once    Question:  Patient needs a walker to treat with the following condition  Answer:  Weakness generalized   11/26/18 1101   11/26/18 1010  For home use only DME 3 n 1  Once     11/26/18 1009          Prescriptions given: Xarelto starter pack Xarelto 20mg  daily #30 five refills (to start on 12/17/2018)  Instructions:   [facility]  Discharge instructions  Lower Extremity Bypass Surgery  Please  refer to the following instruction for your post-procedure care. Your surgeon or physician assistant will discuss any changes with  you.  Activity  You are encouraged to walk as much as you can. You can slowly return to normal activities during the month after your surgery. Avoid strenuous activity and heavy lifting until your doctor tells you it's OK. Avoid activities such as vacuuming or swinging a golf club. Do not drive until your doctor give the OK and you are no longer taking prescription pain medications. It is also normal to have difficulty with sleep habits, eating and bowel movement after surgery. These will go away with time.  Bathing/Showering  Shower daily after you go home. Do not soak in a bathtub, hot tub, or swim until the incision heals completely.  Incision Care  Clean your incision with mild soap and water. Shower every day. Pat the area dry with a clean towel. You do not need a bandage unless otherwise instructed. Do not apply any ointments or creams to your incision. If you have open wounds you will be instructed how to care for them or a visiting nurse may be arranged for you. If you have staples or sutures along your incision they will be removed at your post-op appointment. You may have skin glue on your incision. Do not peel it off. It will come off on its own in about one week.  Wash the groin wound with soap and water daily and pat dry. (No tub bath-only shower)  Then put a dry gauze or washcloth in the groin to keep this area dry to help prevent wound infection.  Do this daily and as needed.  Do not use Vaseline or neosporin on your incisions.  Only use soap and water on your incisions and then protect and keep dry.  Diet  Resume your normal diet. There are no special food restrictions following this procedure. A low fat/ low cholesterol diet is recommended for all patients with vascular disease. In order to heal from your surgery, it is CRITICAL to get adequate nutrition. Your body requires vitamins, minerals, and protein. Vegetables are the best source of vitamins and minerals. Vegetables also  provide the perfect balance of protein. Processed food has little nutritional value, so try to avoid this.  Medications  Resume taking all your medications unless your doctor or physician assistant tells you not to. If your incision is causing pain, you may take over-the-counter pain relievers such as acetaminophen (Tylenol). If you were prescribed a stronger pain medication, please aware these medication can cause nausea and constipation. Prevent nausea by taking the medication with a snack or meal. Avoid constipation by drinking plenty of fluids and eating foods with high amount of fiber, such as fruits, vegetables, and grains. Take Colace 100 mg (an over-the-counter stool softener) twice a day as needed for constipation.  Do not take Tylenol if you are taking prescription pain medications.  Follow Up  Our office will schedule a follow up appointment 2-3 weeks following discharge.  Please call us immediately for any of the following conditions  .Severe or worsening pain in your legs or feet while at rest or while walking .Increase pain, redness, warmth, or drainage (pus) from your incision site(s) Fever of 101 degree or higher The swelling in your leg with the bypass suddenly worsens and becomes more painful than when you were in the hospital If you have been instructed to feel your graft pulse then you should do so every day. If you  can no longer feel this pulse, call the office immediately. Not all patients are given this instruction.  Leg swelling  Wear compression stockings 20-78mmHg daily.  Reduce your risk of vascular disease  Stop smoking. If you would like help call [facility] at 1-800-QUIT-NOW 4046672500) or [place] at [phone].  Manage your cholesterol Maintain a desired weight Control your diabetes weight Control your diabetes Keep your blood pressure down  If you have any questions, please call the office at [phone]   Disposition: home  Patient's  condition: is Good  Follow up: 1. Dr. Donzetta Matters in 4 weeks with duplex of femoral to femoral vein bypass   Leontine Locket, PA-C [facility] [phone] 11/26/2018  2:01 PM

## 2018-11-26 NOTE — Progress Notes (Addendum)
ANTICOAGULATION CONSULT NOTE  Pharmacy Consult for heparin  Indication: s/p femoral artery bypass   Allergies  Allergen Reactions  . Aripiprazole Other (See Comments)    BECOMES  VIOLENT   . Seroquel [Quetiapine Fumarate] Other (See Comments)    BECOMES VIOLENT  . Chlorpromazine Other (See Comments)    SEVERE ANXIETY   . Gabapentin Other (See Comments)    NIGHTMARES     Patient Measurements: Height: 5\' 10"  (177.8 cm) Weight: 260 lb (117.9 kg) IBW/kg (Calculated) : 68.5 Heparin Dosing Weight: 95 kg   Vital Signs: Temp: 98 F (36.7 C) (01/23 0827) Temp Source: Oral (01/23 0827) BP: 123/66 (01/23 0827) Pulse Rate: 96 (01/23 0545)  Labs: Recent Labs    11/25/18 0817 11/25/18 0848 11/25/18 1920 11/26/18 0347  HGB  --  9.7*  --  8.4*  HCT  --  30.6*  --  25.8*  PLT  --  186  --  167  HEPARINUNFRC  --   --  0.21* 0.31  CREATININE 1.42*  --   --   --     Estimated Creatinine Clearance: 64.6 mL/min (A) (by C-G formula based on SCr of 1.42 mg/dL (H)).   Medical History: Past Medical History:  Diagnosis Date  . Anxiety   . Bipolar disorder ([place])   . Chronic kidney disease    Stage 3 kidney disease;dx by Dr. Sinda Du.   . Depression   . GERD (gastroesophageal reflux disease)   . High cholesterol   . History of DVT (deep vein thrombosis)    left leg  . Hypertension    states under control with meds., has been on med. x 2 years  . Hypothyroidism   . Obsessive-compulsive disorder   . Restless leg syndrome   . Trigger thumb of left hand 01/2018  . Trigger thumb of right hand     Medications:  Medications Prior to Admission  Medication Sig Dispense Refill Last Dose  . ALPRAZolam (XANAX) 1 MG tablet Take 1 tablet (1 mg total) by mouth 2 (two) times daily as needed for anxiety. 60 tablet 2 11/24/2018 at 0430  . amLODipine (NORVASC) 10 MG tablet Take 10 mg by mouth daily.    Taking  . amphetamine-dextroamphetamine (ADDERALL) 30 MG tablet Take 1 tablet by  mouth 2 (two) times daily. Fill after 10/27/18 60 tablet 0 Past Week at Unknown time  . aspirin EC 81 MG tablet Take 81 mg by mouth daily.   11/23/2018 at Unknown time  . aspirin-acetaminophen-caffeine (EXCEDRIN MIGRAINE) 250-250-65 MG tablet Take 2 tablets by mouth 2 (two) times daily as needed for headache or migraine.    Past Week at Unknown time  . atorvastatin (LIPITOR) 20 MG tablet Take 20 mg by mouth daily.   11/24/2018 at 0430  . cholecalciferol (VITAMIN D) 1000 units tablet Take 1,000 Units by mouth at bedtime.    Past Week at Unknown time  . cycloSPORINE (RESTASIS) 0.05 % ophthalmic emulsion Place 1 drop into both eyes 2 (two) times daily.   11/24/2018 at 0430  . DULoxetine (CYMBALTA) 60 MG capsule Take 1 capsule (60 mg total) by mouth 2 (two) times daily. 60 capsule 2 11/24/2018 at 0430  . ELDERBERRY PO Take 50 mg by mouth daily.   Past Week at Unknown time  . hydrochlorothiazide (HYDRODIURIL) 25 MG tablet Take 25 mg by mouth daily.   11/23/2018 at Unknown time  . HYDROcodone-acetaminophen (NORCO) 10-325 MG tablet Take 1 tablet by mouth every 6 (six) hours  as needed (for pain.).   11/23/2018 at Unknown time  . KLOR-CON M10 10 MEQ tablet Take 10 mEq by mouth 3 (three) times daily.  12 11/23/2018 at Unknown time  . Krill Oil (OMEGA-3) 500 MG CAPS Take 500 mg by mouth at bedtime.   Past Week at Unknown time  . levothyroxine (SYNTHROID, LEVOTHROID) 137 MCG tablet Take 137 mcg by mouth daily before breakfast.   11/24/2018 at 0430  . linaclotide (LINZESS) 145 MCG CAPS capsule Take 145 mcg by mouth daily.    11/24/2018 at 0430  . Magnesium 250 MG TABS Take 250 mg by mouth daily.   Past Week at Unknown time  . Melatonin 10 MG TBDP Take 10 mg by mouth at bedtime as needed (for sleep).   Past Week at Unknown time  . Multiple Vitamin (MULTIVITAMIN WITH MINERALS) TABS tablet Take 1 tablet by mouth daily. One-A-Day Women's Multivitamin   Past Week at Unknown time  . pantoprazole (PROTONIX) 40 MG tablet Take 40  mg by mouth daily.   11/24/2018 at 0430  . PAZEO 0.7 % SOLN Place 1 drop into both eyes 2 (two) times daily.    11/24/2018 at 0430  . Polyvinyl Alcohol-Povidone (REFRESH OP) Place 1 drop into both eyes 4 (four) times daily.   11/24/2018 at 0430  . potassium chloride (K-DUR) 10 MEQ tablet Take 10 mEq by mouth 3 (three) times daily.   11/23/2018 at Unknown time  . rOPINIRole (REQUIP) 2 MG tablet Take 2 mg by mouth 2 (two) times daily.   11/24/2018 at 0430  . cetirizine (ZYRTEC) 10 MG tablet Take 10 mg by mouth daily.   Unknown at Unknown time  . diclofenac sodium (VOLTAREN) 1 % GEL Apply 2-4 g topically 2 (two) times daily as needed (for foot pain.).   Unknown at Unknown time  . fluticasone (FLONASE) 50 MCG/ACT nasal spray Place 1 spray into both nostrils daily as needed for allergies or rhinitis.   Unknown at Unknown time  . ketoconazole (NIZORAL) 2 % cream Apply 1 application topically 2 (two) times daily as needed for irritation.    Unknown at Unknown time  . nystatin (MYCOSTATIN/NYSTOP) powder Apply 1 g topically 2 (two) times daily as needed (for skin irritation/rash).    Unknown at Unknown time  . zolpidem (AMBIEN) 10 MG tablet Take 1 tablet (10 mg total) by mouth at bedtime as needed for sleep. 30 tablet 2 Unknown at Unknown time    Assessment: 48 YOF with occluded left common and external iliac veins s/p common femoral vein bypass. Pharmacy consulted for heparin and plans to start xarelto (VTE dosing)  -heparin level= 0.31, hg= 8.4  Goal of therapy: Heparin level 0.3-0.7 Monitor platelets per protocol.     Plan:  -discontinue heparin  -Xarelto 15mg  po bid for 21 days then start 20mg  po daily on 2/13  Hildred Laser, PharmD Clinical Pharmacist **Pharmacist phone directory can now be found on [email or web address] ([email or web address]).  Listed under [place].

## 2018-11-26 NOTE — Care Management Note (Addendum)
Case Management Note Marvetta Gibbons RN, BSN Transitions of Care Unit 4E- RN Case Manager [phone]  Patient Details  Name: Jane [name] MRN: [id number] Date of Birth: 1966-02-09  Subjective/Objective:   Pt admitted s/p fem-fem bypass                 Action/Plan: PTA pt lived at home, per PT eval recommendations for [facility] and DME-RW/3n1- orders have been placed for DME- awaiting HH orders- CM spoke with pt at bedside to discuss transition of care needs- list provided Per CMS website with star ratings (copy placed in shadow chart)- pt states she was told at vascular office that an agency would be calling her to set up home health- asked if it was [facility] as they have a working relationship with Vascular- pt reports she is not sure- but is fine with using them or [facility] if [facility] can not provide services. Call made to Tiffany with [facility] to check on referral- referral has been accepted for [facility] needs. Will need HH orders placed prior to discharge.  Address, phone # and PCP all confirmed with pt in epic. Pt also to start on Xarelto- which is a preferred med under Medicaid benefits- TOC pharmacy to deliver first 30 day free to bedside prior to discharge.   Expected Discharge Date:     11/26/18             Expected Discharge Plan:  [place]  In-House Referral:     Discharge planning Services  CM Consult  Post Acute Care Choice:  Durable Medical Equipment, Home Health Choice offered to:  Patient  DME Arranged:  3-N-1, Walker rolling DME Agency:  [place]:  PT, RN [facility] Agency:  [facility]  Status of Service:  Completed, signed off  If discussed at [place] of Stay Meetings, dates discussed:    Discharge Disposition: home/home health   Additional Comments:  Dawayne Patricia, RN 11/26/2018, 11:55 AM

## 2018-11-26 NOTE — Progress Notes (Addendum)
[facility]  Subjective  - Has migraine this am, otherwise no new complaints.   Objective 116/70 96 98.4 F (36.9 C) (Oral) 16 (!) 85%  Intake/Output Summary (Last 24 hours) at 11/26/2018 0815 Last data filed at 11/25/2018 2000 Gross per 24 hour  Intake 288.12 ml  Output 700 ml  Net -411.88 ml    Dry guaze over groin incisions B without drainage  Heart RRR Lungs non labored breathing   Lower Venous Study  Indications: Status post left common femoral vein to right common femoral vein saphenous vein bypass graft with left superficial femoral artery to left femoral vein arteriovenous fistula.   Limitations: Body habitus, poor ultrasound/tissue interface and bandages. Performing Technologist: Maudry Mayhew MHA, RDMS, RVT, RDCS    Examination Guidelines: A complete evaluation includes B-mode imaging, spectral Doppler, color Doppler, and power Doppler as needed of all accessible portions of each vessel. Bilateral testing is considered an integral part of a complete examination. Limited examinations for reoccurring indications may be performed as noted.    Right Venous Findings: +---------+---------------+---------+-----------+----------+--------------+          CompressibilityPhasicitySpontaneityPropertiesSummary        +---------+---------------+---------+-----------+----------+--------------+ CFV                                                   Not visualized +---------+---------------+---------+-----------+----------+--------------+ SFJ                                                   Not visualized +---------+---------------+---------+-----------+----------+--------------+ FV Prox                          Yes                                 +---------+---------------+---------+-----------+----------+--------------+ FV Mid                           Yes                                  +---------+---------------+---------+-----------+----------+--------------+ FV Distal                        Yes                                 +---------+---------------+---------+-----------+----------+--------------+ POP      Full           Yes      Yes                                 +---------+---------------+---------+-----------+----------+--------------+ PTV      Full                                                        +---------+---------------+---------+-----------+----------+--------------+  PERO     Full                                                        +---------+---------------+---------+-----------+----------+--------------+     Left Venous Findings: +---------+---------------+---------+-----------+----------+--------------+          CompressibilityPhasicitySpontaneityPropertiesSummary        +---------+---------------+---------+-----------+----------+--------------+ CFV                                                   Not visualized +---------+---------------+---------+-----------+----------+--------------+ SFJ                                                   Not visualized +---------+---------------+---------+-----------+----------+--------------+ FV Prox                          Yes                                 +---------+---------------+---------+-----------+----------+--------------+ FV Mid                           Yes                                 +---------+---------------+---------+-----------+----------+--------------+ FV Distal                        Yes                                 +---------+---------------+---------+-----------+----------+--------------+ POP                              Yes                                 +---------+---------------+---------+-----------+----------+--------------+ PTV      Full                    Yes                                  +---------+---------------+---------+-----------+----------+--------------+ PERO     Full                    Yes                                 +---------+---------------+---------+-----------+----------+--------------+          Summary: Right: There is no evidence of deep vein thrombosis in the lower extremity. However, portions of this examination were limited- see technologist comments above. No cystic structure found  in the popliteal fossa. Left: There is no evidence of deep vein thrombosis in the lower extremity. However, portions of this examination were limited- see technologist comments above. No cystic structure found in the popliteal fossa.   Flow was visualized crossing from the left groin to the right groin, suggestive of patent venous bypass vein graft. Heterogenous area was noted at the right pubis area, suggestive of possible hematoma.   Assessment/Planning: POD #2   53 y.o. female is s/p:  1.Left common femoral vein to right common femoral vein bypass with cryopreserved greater saphenous vein 5.5 mm in diameter 2.Creation of left superficial femoral artery to left femoral vein AV fistula with interposition cryopreserved saphenous vein  Pending discharge on oral anticoagulation Compression daily left LE Currently on Heparin full dose  Roxy Horseman 11/26/2018 8:15 AM --  Laboratory Lab Results: Recent Labs    11/25/18 0848 11/26/18 0347  WBC 12.7* 11.8*  HGB 9.7* 8.4*  HCT 30.6* 25.8*  PLT 186 167   BMET Recent Labs    11/25/18 0817  NA 139  K 3.3*  CL 106  CO2 23  GLUCOSE 176*  BUN 29*  CREATININE 1.42*  CALCIUM 8.4*    COAG Lab Results  Component Value Date   INR 0.95 11/17/2018   No results found for: PTT  I have independently interviewed and examined patient and agree with PA assessment and plan above.  Patient okay for discharge home.  There is a strong signal overlying her mons pubis to suggest bypass  is patent.  We will set up with oral anticoagulation for at least 6 months and will need walker for home.  Follow-up in 1 month in my office with femorofemoral vein duplex.  Nowell Sites C. Donzetta Matters, MD [facility] Office: [phone] Pager: [phone]

## 2018-11-26 NOTE — Discharge Instructions (Signed)
° °[facility] ° °Discharge instructions ° °Lower Extremity Bypass Surgery ° °Please refer to the following instruction for your post-procedure care. Your surgeon or physician assistant will discuss any changes with you. ° °Activity ° °You are encouraged to walk as much as you can. You can slowly return to normal activities during the month after your surgery. Avoid strenuous activity and heavy lifting until your doctor tells you it's OK. Avoid activities such as vacuuming or swinging a golf club. Do not drive until your doctor give the OK and you are no longer taking prescription pain medications. It is also normal to have difficulty with sleep habits, eating and bowel movement after surgery. These will go away with time. ° °Bathing/Showering ° °Shower daily after you go home. Do not soak in a bathtub, hot tub, or swim until the incision heals completely. ° °Incision Care ° °Clean your incision with mild soap and water. Shower every day. Pat the area dry with a clean towel. You do not need a bandage unless otherwise instructed. Do not apply any ointments or creams to your incision. If you have open wounds you will be instructed how to care for them or a visiting nurse may be arranged for you. If you have staples or sutures along your incision they will be removed at your post-op appointment. You may have skin glue on your incision. Do not peel it off. It will come off on its own in about one week. ° °Wash the groin wound with soap and water daily and pat dry. (No tub bath-only shower)  Then put a dry gauze or washcloth in the groin to keep this area dry to help prevent wound infection.  Do this daily and as needed.  Do not use Vaseline or neosporin on your incisions.  Only use soap and water on your incisions and then protect and keep dry. ° °Diet ° °Resume your normal diet. There are no special food restrictions following this procedure. A low fat/ low cholesterol diet is  recommended for all patients with vascular disease. In order to heal from your surgery, it is CRITICAL to get adequate nutrition. Your body requires vitamins, minerals, and protein. Vegetables are the best source of vitamins and minerals. Vegetables also provide the perfect balance of protein. Processed food has little nutritional value, so try to avoid this. ° °Medications ° °Resume taking all your medications unless your doctor or physician assistant tells you not to. If your incision is causing pain, you may take over-the-counter pain relievers such as acetaminophen (Tylenol). If you were prescribed a stronger pain medication, please aware these medication can cause nausea and constipation. Prevent nausea by taking the medication with a snack or meal. Avoid constipation by drinking plenty of fluids and eating foods with high amount of fiber, such as fruits, vegetables, and grains. Take Colace 100 mg (an over-the-counter stool softener) twice a day as needed for constipation.  °Do not take Tylenol if you are taking prescription pain medications. ° °Follow Up ° °Our office will schedule a follow up appointment 2-3 weeks following discharge. ° °Please call us immediately for any of the following conditions ° °•Severe or worsening pain in your legs or feet while at rest or while walking •Increase pain, redness, warmth, or drainage (pus) from your incision site(s) °Fever of 101 degree or higher °The swelling in your leg with the bypass suddenly worsens and becomes more painful than when you were in the hospital °If you have   been instructed to feel your graft pulse then you should do so every day. If you can no longer feel this pulse, call the office immediately. Not all patients are given this instruction.  Leg swelling  Wear compression stockings 20-30mmHg daily.   Reduce your risk of vascular disease  Stop smoking. If you would like help call [facility] at 1-800-QUIT-NOW 617-156-6516) or [place] at  [phone].  Manage your cholesterol Maintain a desired weight Control your diabetes weight Control your diabetes Keep your blood pressure down  If you have any questions, please call the office at [phone]

## 2018-11-26 NOTE — Progress Notes (Signed)
Physical Therapy Treatment Patient Details Name: Jane [name] MRN: [id number] DOB: 1966/03/11 Today's Date: 11/26/2018    History of Present Illness Pt is a 53 y/o female s/p L femoral-femoral bypass graft on 1/21. PMH includes HTN.     PT Comments    Patient is progressing very well towards their physical therapy goals. Pt able to ascend/descend 3 steps with left railing and min guard assist to prepare for discharge home. Pt friend present and observed guarding technique. PT demonstrated method for performing tub transfer and pt verbalized understanding. Provided education on generalized walking program, activity progression, and cryotherapy. Pt with no further questions/concerns.    Follow Up Recommendations  Home health PT;Supervision for mobility/OOB     Equipment Recommendations  Rolling walker with 5" wheels;3in1 (PT)    Recommendations for Other Services       Precautions / Restrictions Precautions Precautions: Fall Restrictions Weight Bearing Restrictions: No    Mobility  Bed Mobility Overal bed mobility: Modified Independent                Transfers Overall transfer level: Needs assistance Equipment used: Rolling walker (2 wheeled) Transfers: Sit to/from Stand Sit to Stand: Supervision            Ambulation/Gait Ambulation/Gait assistance: Supervision Gait Distance (Feet): 150 Feet Assistive device: Rolling walker (2 wheeled) Gait Pattern/deviations: Step-through pattern;Decreased step length - right;Decreased step length - left;Decreased weight shift to left;Antalgic;Trunk flexed Gait velocity: Decreased    General Gait Details: Cues for upright posture and hip extension   Stairs Stairs: Yes Stairs assistance: Min guard Stair Management: One rail Left Number of Stairs: 3 General stair comments: Cues for step by step pattern and sequencing   Wheelchair Mobility    Modified Rankin (Stroke Patients Only)       Balance Overall  balance assessment: Needs assistance Sitting-balance support: No upper extremity supported;Feet supported Sitting balance-Leahy Scale: Good     Standing balance support: During functional activity;No upper extremity supported Standing balance-Leahy Scale: Fair Standing balance comment: Reliant on BUE support                             Cognition Arousal/Alertness: Awake/alert Behavior During Therapy: WFL for tasks assessed/performed Overall Cognitive Status: Within Functional Limits for tasks assessed                                        Exercises      General Comments        Pertinent Vitals/Pain Pain Assessment: Faces Faces Pain Scale: Hurts little more Pain Location: "migraine" Pain Descriptors / Indicators: Headache Pain Intervention(s): Monitored during session    Home Living                      Prior Function            PT Goals (current goals can now be found in the care plan section) Acute Rehab PT Goals Patient Stated Goal: to go home PT Goal Formulation: With patient Time For Goal Achievement: 12/09/18 Potential to Achieve Goals: Good Progress towards PT goals: Progressing toward goals    Frequency    Min 3X/week      PT Plan Current plan remains appropriate    Co-evaluation              AM-PAC PT "  6 Clicks" Mobility   Outcome Measure  Help needed turning from your back to your side while in a flat bed without using bedrails?: None Help needed moving from lying on your back to sitting on the side of a flat bed without using bedrails?: None Help needed moving to and from a bed to a chair (including a wheelchair)?: A Little Help needed standing up from a chair using your arms (e.g., wheelchair or bedside chair)?: A Little Help needed to walk in hospital room?: A Little Help needed climbing 3-5 steps with a railing? : A Little 6 Click Score: 20    End of Session Equipment Utilized During  Treatment: Gait belt Activity Tolerance: Patient tolerated treatment well Patient left: in bed;with call bell/phone within reach(sitting EOB ) Nurse Communication: Mobility status PT Visit Diagnosis: Other abnormalities of gait and mobility (R26.89);Pain Pain - Right/Left: Left Pain - part of body: Leg     Time: 1040-1059 PT Time Calculation (min) (ACUTE ONLY): 19 min  Charges:  $Gait Training: 8-22 mins                    Ellamae Sia, PT, DPT Acute Rehabilitation Services Pager [phone] Office [phone]    Willy Eddy 11/26/2018, 11:09 AM

## 2018-11-27 ENCOUNTER — Telehealth: Payer: Self-pay | Admitting: Vascular Surgery

## 2018-11-27 NOTE — Telephone Encounter (Signed)
-----   Message from Gabriel Earing, [place] sent at 11/26/2018 11:00 AM EST ----- S/p femoral vein to femoral vein bypass with cryo vein 11/24/2018.  Needs to f/u with Dr. Donzetta Matters in 4 weeks with fem-fem vein bypass duplex   Thanks

## 2018-11-27 NOTE — Telephone Encounter (Signed)
sch appt [facility] 12/25/2018 3pm fem-fem 345pm p/o MD

## 2018-11-29 ENCOUNTER — Other Ambulatory Visit: Payer: Self-pay

## 2018-11-29 ENCOUNTER — Emergency Department (HOSPITAL_COMMUNITY)
Admission: EM | Admit: 2018-11-29 | Discharge: 2018-11-29 | Disposition: A | Discharge status: Home / Self Care | Payer: Medicaid Other | Attending: Emergency Medicine | Admitting: Emergency Medicine

## 2018-11-29 ENCOUNTER — Encounter (HOSPITAL_COMMUNITY): Payer: Self-pay | Admitting: Emergency Medicine

## 2018-11-29 DIAGNOSIS — R2240 Localized swelling, mass and lump, unspecified lower limb: Secondary | ICD-10-CM | POA: Diagnosis present

## 2018-11-29 DIAGNOSIS — E039 Hypothyroidism, unspecified: Secondary | ICD-10-CM | POA: Insufficient documentation

## 2018-11-29 DIAGNOSIS — M7989 Other specified soft tissue disorders: Secondary | ICD-10-CM

## 2018-11-29 DIAGNOSIS — R2242 Localized swelling, mass and lump, left lower limb: Secondary | ICD-10-CM | POA: Diagnosis not present

## 2018-11-29 DIAGNOSIS — N183 Chronic kidney disease, stage 3 (moderate): Secondary | ICD-10-CM | POA: Diagnosis not present

## 2018-11-29 DIAGNOSIS — Z7982 Long term (current) use of aspirin: Secondary | ICD-10-CM | POA: Diagnosis not present

## 2018-11-29 DIAGNOSIS — Z79899 Other long term (current) drug therapy: Secondary | ICD-10-CM | POA: Insufficient documentation

## 2018-11-29 DIAGNOSIS — I129 Hypertensive chronic kidney disease with stage 1 through stage 4 chronic kidney disease, or unspecified chronic kidney disease: Secondary | ICD-10-CM | POA: Insufficient documentation

## 2018-11-29 LAB — BASIC METABOLIC PANEL
Anion gap: 9 (ref 5–15)
BUN: 32 mg/dL — ABNORMAL HIGH (ref 6–20)
CO2: 27 mmol/L (ref 22–32)
Calcium: 9.2 mg/dL (ref 8.9–10.3)
Chloride: 101 mmol/L (ref 98–111)
Creatinine, Ser: 1.18 mg/dL — ABNORMAL HIGH (ref 0.44–1.00)
GFR calc Af Amer: >60 mL/min (ref >60)
GFR calc non Af Amer: 53 mL/min — ABNORMAL LOW (ref >60)
GLUCOSE: 175 mg/dL — AB (ref 70–99)
Potassium: 3.7 mmol/L (ref 3.5–5.1)
Sodium: 137 mmol/L (ref 135–145)

## 2018-11-29 LAB — CBC
HCT: 27.1 % — ABNORMAL LOW (ref 36.0–46.0)
Hemoglobin: 8.3 g/dL — ABNORMAL LOW (ref 12.0–15.0)
MCH: 28.2 pg (ref 26.0–34.0)
MCHC: 30.6 g/dL (ref 30.0–36.0)
MCV: 92.2 fL (ref 80.0–100.0)
Platelets: 214 10*3/uL (ref 150–400)
RBC: 2.94 MIL/uL — ABNORMAL LOW (ref 3.87–5.11)
RDW: 15.3 % (ref 11.5–15.5)
WBC: 9.3 10*3/uL (ref 4.0–10.5)
nRBC: 0.2 % (ref 0.0–0.2)

## 2018-11-29 NOTE — ED Provider Notes (Addendum)
[facility] EMERGENCY DEPARTMENT Provider Note   CSN: [id number] Arrival date & time: 11/29/18  1951     History   Chief Complaint Chief Complaint  Patient presents with  . Leg Swelling    HPI Jane [name] is a 53 y.o. female.  Patient status post vascular surgery on January 21 by Dr. Gwenlyn Saran.  Had a femorofemoral done to increase blood flow to the left lower extremity.  Patient was started on Xarelto on Friday morning.  And has been taking it.  Patient without any chest pain or shortness of breath no fevers.  No significant increase pain to the left leg.  She is worried about swelling to the left calf area.  And obviously has concerns for possible blood clot.  As stated no fever no chest pain no shortness of breath.  No increased pain to the leg.     Past Medical History:  Diagnosis Date  . Anxiety   . Bipolar disorder ([place])   . Chronic kidney disease    Stage 3 kidney disease;dx by Dr. Sinda Du.   . Depression   . GERD (gastroesophageal reflux disease)   . High cholesterol   . History of DVT (deep vein thrombosis)    left leg  . Hypertension    states under control with meds., has been on med. x 2 years  . Hypothyroidism   . Obsessive-compulsive disorder   . Restless leg syndrome   . Trigger thumb of left hand 01/2018  . Trigger thumb of right hand     Patient Active Problem List   Diagnosis Date Noted  . 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 Chronic venous insufficiency 11/21/2017  . Radicular low back pain 05/25/2014  . Depression, major, recurrent ([place]) 06/17/2013  . OCD (obsessive compulsive disorder) 11/20/2012  . 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 Bipolar 1 disorder ([place]) 09/25/2012  . DVT (deep venous thrombosis) ([place]) 08/18/2012    Past Surgical History:  Procedure Laterality Date  . ABDOMINAL HYSTERECTOMY  06/2016   complete  . AV FISTULA PLACEMENT Left 11/24/2018   Procedure: ARTERIOVENOUS  (AV) FISTULA CREATION LEFT SFA TO LEFT FEMORAL VEIN;  Surgeon: Waynetta Sandy, MD;  Location: [place];  Service: Vascular;  Laterality: Left;  . CHOLECYSTECTOMY    . FEMORAL-FEMORAL BYPASS GRAFT Left 11/24/2018   Procedure: BYPASS GRAFT FEMORAL-FEMORAL VENOUS LEFT TO RIGHT PALMA PROCEDURE USING CRYOVEIN;  Surgeon: Waynetta Sandy, MD;  Location: [place];  Service: Vascular;  Laterality: Left;  . LOWER EXTREMITY VENOGRAPHY N/A 08/17/2018   Procedure: LOWER EXTREMITY VENOGRAPHY - Central Venogram;  Surgeon: Waynetta Sandy, MD;  Location: [place] [facility];  Service: Cardiovascular;  Laterality: N/A;  . LUMBAR FUSION  11/21/2000   L5-S1  . LUMBAR SPINE SURGERY     x 2 others  . TRIGGER FINGER RELEASE Right 12/01/2017   Procedure: RELEASE TRIGGER FINGER/A-1 PULLEY RIGHT THUMB;  Surgeon: Leanora Cover, MD;  Location: [place];  Service: Orthopedics;  Laterality: Right;  . TRIGGER FINGER RELEASE Left 01/26/2018   Procedure: LEFT TRIGGER THUMB RELEASE;  Surgeon: Leanora Cover, MD;  Location: [place];  Service: Orthopedics;  Laterality: Left;     OB History   No obstetric history on file.      Home Medications    Prior to Admission medications   Medication Sig Start Date End Date Taking? Authorizing Provider  ALPRAZolam Duanne Moron) 1 MG tablet Take 1  tablet (1 mg total) by mouth 2 (two) times daily as needed for anxiety. 09/28/18 09/28/19  Cloria Spring, MD  amLODipine (NORVASC) 10 MG tablet Take 10 mg by mouth daily.     [provider]  amphetamine-dextroamphetamine (ADDERALL) 30 MG tablet Take 1 tablet by mouth 2 (two) times daily. Fill after 10/27/18 09/28/18 09/28/19  Cloria Spring, MD  aspirin EC 81 MG tablet Take 81 mg by mouth daily.    [provider]  aspirin-acetaminophen-caffeine (EXCEDRIN MIGRAINE) [phone] MG tablet Take 2 tablets by mouth 2 (two) times daily as needed for headache or migraine.      [provider]  atorvastatin (LIPITOR) 20 MG tablet Take 20 mg by mouth daily.    [provider]  cetirizine (ZYRTEC) 10 MG tablet Take 10 mg by mouth daily.    [provider]  cholecalciferol (VITAMIN D) 1000 units tablet Take 1,000 Units by mouth at bedtime.     [provider]  cycloSPORINE (RESTASIS) 0.05 % ophthalmic emulsion Place 1 drop into both eyes 2 (two) times daily.    [provider]  diclofenac sodium (VOLTAREN) 1 % GEL Apply 2-4 g topically 2 (two) times daily as needed (for foot pain.).    [provider]  DULoxetine (CYMBALTA) 60 MG capsule Take 1 capsule (60 mg total) by mouth 2 (two) times daily. 09/28/18 09/28/19  Cloria Spring, MD  ELDERBERRY PO Take 50 mg by mouth daily.    [provider]  fluticasone (FLONASE) 50 MCG/ACT nasal spray Place 1 spray into both nostrils daily as needed for allergies or rhinitis.    [provider]  hydrochlorothiazide (HYDRODIURIL) 25 MG tablet Take 25 mg by mouth daily.    [provider]  HYDROcodone-acetaminophen (NORCO) 10-325 MG tablet Take 1 tablet by mouth every 6 (six) hours as needed (for pain.).    [provider]  ketoconazole (NIZORAL) 2 % cream Apply 1 application topically 2 (two) times daily as needed for irritation.     [provider]  KLOR-CON M10 10 MEQ tablet Take 10 mEq by mouth 3 (three) times daily. 08/18/18   [provider]  Javier Docker Oil (OMEGA-3) 500 MG CAPS Take 500 mg by mouth at bedtime.    [provider]  levothyroxine (SYNTHROID, LEVOTHROID) 137 MCG tablet Take 137 mcg by mouth daily before breakfast.    [provider]  linaclotide (LINZESS) 145 MCG CAPS capsule Take 145 mcg by mouth daily.     [provider]  Magnesium 250 MG TABS Take 250 mg by mouth daily.    [provider]  Melatonin 10 MG TBDP Take 10 mg by mouth at bedtime as needed (for sleep).    [provider]  Multiple Vitamin (MULTIVITAMIN WITH MINERALS) TABS tablet Take 1 tablet by mouth daily. One-A-Day Women's Multivitamin    [provider]  nystatin (MYCOSTATIN/NYSTOP) powder Apply 1 g topically 2 (two) times daily as needed (for skin irritation/rash).     [provider]  pantoprazole (PROTONIX) 40 MG tablet Take 40 mg by mouth daily.    [provider]  PAZEO 0.7 % SOLN Place 1 drop into both eyes 2 (two) times daily.     [provider]  Polyvinyl Alcohol-Povidone (REFRESH OP) Place 1 drop into both eyes 4 (four) times daily.    [provider]  potassium chloride (K-DUR) 10 MEQ tablet Take 10 mEq by mouth 3 (three) times daily.  [provider]  rivaroxaban (XARELTO) 20 MG TABS tablet Take 1 tablet (20 mg total) by mouth daily with supper. 12/17/18   Rhyne, Hulen Shouts, PA-C  Rivaroxaban 15 & 20 MG TBPK Take as directed on package: Start with one 15mg  tablet by mouth twice a day with food. On Day 22, switch to one 20mg  tablet once a day with food. 11/26/18   Rhyne, Hulen Shouts, PA-C  rOPINIRole (REQUIP) 2 MG tablet Take 2 mg by mouth 2 (two) times daily.    [provider]  zolpidem (AMBIEN) 10 MG tablet Take 1 tablet (10 mg total) by mouth at bedtime as needed for sleep. 07/28/18 11/12/18  Cloria Spring, MD    Family History Family History  Problem Relation Age of Onset  . Heart disease Mother   . Hyperlipidemia Mother   . Hypertension Mother   . Bipolar disorder Mother   . Diabetes Father   . Heart disease Father   . Hyperlipidemia Father   . Hypertension Father   . Drug abuse Daughter   . ADD / ADHD Daughter   . Drug abuse Daughter   . Anxiety disorder Daughter   . Bipolar disorder Daughter   . Hypertension Sister   . Hypertension Brother   . Hyperlipidemia Brother   . Heart disease Brother   . Bipolar disorder Maternal Aunt   . Suicidality Maternal Aunt     Social History Social History    Tobacco Use  . Smoking status: Never Smoker  . Smokeless tobacco: Never Used  Substance Use Topics  . Alcohol use: No  . Drug use: No     Allergies   Aripiprazole; Seroquel [quetiapine fumarate]; Chlorpromazine; and Gabapentin   Review of Systems Review of Systems  Constitutional: Negative for chills and fever.  HENT: Negative for rhinorrhea and sore throat.   Eyes: Negative for visual disturbance.  Respiratory: Negative for cough and shortness of breath.   Cardiovascular: Positive for leg swelling. Negative for chest pain.  Gastrointestinal: Negative for abdominal pain, diarrhea, nausea and vomiting.  Genitourinary: Negative for dysuria.  Musculoskeletal: Negative for back pain and neck pain.  Skin: Negative for rash.  Neurological: Negative for dizziness, light-headedness and headaches.  Hematological: Bruises/bleeds easily.  Psychiatric/Behavioral: Negative for confusion.     Physical Exam Updated Vital Signs BP (!) 150/76 (BP Location: Right Arm)   Pulse 95   Temp 98.2 F (36.8 C) (Oral)   Resp 18   Ht 1.778 m (5\' 10" )   Wt 117.9 kg   SpO2 100%   BMI 37.29 kg/m   Physical Exam Vitals signs and nursing note reviewed.  Constitutional:      General: She is not in acute distress.    Appearance: She is well-developed. She is not toxic-appearing.  HENT:     Head: Normocephalic and atraumatic.  Eyes:     Conjunctiva/sclera: Conjunctivae normal.  Neck:     Musculoskeletal: Neck supple.  Cardiovascular:     Rate and Rhythm: Normal rate and regular rhythm.     Heart sounds: No murmur.  Pulmonary:     Effort: Pulmonary effort is normal. No respiratory distress.     Breath sounds: Normal breath sounds. No wheezing or rales.  Abdominal:     Palpations: Abdomen is soft.     Tenderness: There is no abdominal tenderness.  Musculoskeletal:        General: Swelling present. No tenderness.     Left lower leg: Edema present.  Comments: Patient's groin wounds  appear to be healing well.  No evidence of infection.  There is ecchymosis around the area that was expected based on the surgery.  Good cap refill to both feet.  Both feet warm.  Left calf is kind of swollen and tight numbness not necessarily pitting edema.  No evidence of any compartment syndrome.  No pain with range of motion at the ankle or at foot.  Cap refill is 2 seconds to both feet.  Skin:    General: Skin is warm and dry.     Capillary Refill: Capillary refill takes less than 2 seconds.  Neurological:     General: No focal deficit present.     Mental Status: She is alert and oriented to person, place, and time.      ED Treatments / Results  Labs (all labs ordered are listed, but only abnormal results are displayed) Labs Reviewed  BASIC METABOLIC PANEL - Abnormal; Notable for the following components:      Result Value   Glucose, Bld 175 (*)    BUN 32 (*)    Creatinine, Ser 1.18 (*)    GFR calc non Af Amer 53 (*)    All other components within normal limits  CBC - Abnormal; Notable for the following components:   RBC 2.94 (*)    Hemoglobin 8.3 (*)    HCT 27.1 (*)    All other components within normal limits    EKG None  Radiology No results found.  Procedures Procedures (including critical care time)  Medications Ordered in ED Medications - No data to display   Initial Impression / Assessment and Plan / ED Course  I have reviewed the triage vital signs and the nursing notes.  Pertinent labs & imaging results that were available during my care of the patient were reviewed by me and considered in my medical decision making (see chart for details).     Patient status post vascular surgery on January 21 by Dr. Gwenlyn Saran.  Had a femoropopliteal.  To bring better blood flow to her left leg.  Patient noted some increased swelling in the calf area seems to be spreading to the thigh area starting yesterday but getting worse today.  Patient is on Xarelto.  But just started  the first dose on Friday morning.  No chest pain or shortness of breath.  On examination patient with good cap refill to the left foot.  2 seconds.  Foot is warm.  Calf does have some swelling and some tightness.  No evidence of any compartment syndrome.  No significant swelling to the left thigh area.  Both groin wounds appear to be healing well.  There is some ecchymosis around the area which is expected.  No evidence of any wound infection.  Offered patient to have Doppler study of lower extremity here tomorrow to rule out DVT.  We do not have that available here tonight.  Patient had basic labs done without any significant changes in her renal function or hemoglobin since surgery.  Patient's oxygen saturations have been normal here.  In the upper 100s.  No concern for pulmonary embolus at this time.  No concern for any significant vascular insufficiency at this time.  Patient has opted to follow-up with vascular clinic in the morning Dr. Tod Persia office as opposed to coming here to get the Doppler study.  They most likely can arrange to have DVT study done for her through their vascular lab.  Patient nontoxic no  acute distress.  Patient will continue the Xarelto.  Final Clinical Impressions(s) / ED Diagnoses   Final diagnoses:  Left leg swelling    ED Discharge Orders    None       Fredia Sorrow, MD 11/29/18 2225    Fredia Sorrow, MD 11/29/18 2227

## 2018-11-29 NOTE — Discharge Instructions (Addendum)
Labs here tonight without any significant abnormalities.  Continue your Xarelto.  Call vascular clinic in the morning.  We feel that you probably need Doppler studies of that left leg for concerns for DVT.  Return for any new or worse symptoms.  Return for chest pain or difficulty breathing.

## 2018-11-29 NOTE — ED Notes (Addendum)
Pitting edema noted to L lower extremity. Pulses verified with Doppler and were strong. Pt has post surgical bruising to bilateral inner thighs and groins.

## 2018-11-29 NOTE — ED Triage Notes (Signed)
Patient states she had surgical procedure on Tuesday and started having swelling to entire left leg and upper right leg today. Also complaining of pain to bilateral legs.

## 2018-11-30 ENCOUNTER — Ambulatory Visit (HOSPITAL_COMMUNITY): Payer: Medicaid Other | Admitting: Psychiatry

## 2018-11-30 ENCOUNTER — Telehealth: Payer: Self-pay

## 2018-11-30 ENCOUNTER — Other Ambulatory Visit (HOSPITAL_BASED_OUTPATIENT_CLINIC_OR_DEPARTMENT_OTHER): Payer: Self-pay

## 2018-11-30 DIAGNOSIS — G2581 Restless legs syndrome: Secondary | ICD-10-CM

## 2018-11-30 NOTE — Telephone Encounter (Signed)
Pt. Called with c/o swelling of entire left "leg, foot, and ankle" and is concerned she has a DVT. She does not feel at this time left leg is warm to the touch or has redness, only swelling. I explained to her this sounds more like post-op swelling and that she should elevate her leg, as well as pump her foot. She then stated last time she had a blood clot swelling was the only symptom. I told her to report to the ED if she feels this is the same as her previous experience with a DVT. Pt. Verbalized understanding.

## 2018-12-04 ENCOUNTER — Encounter: Payer: Self-pay | Admitting: Vascular Surgery

## 2018-12-04 ENCOUNTER — Other Ambulatory Visit: Payer: Self-pay

## 2018-12-04 ENCOUNTER — Ambulatory Visit (INDEPENDENT_AMBULATORY_CARE_PROVIDER_SITE_OTHER): Payer: Self-pay | Admitting: Vascular Surgery

## 2018-12-04 VITALS — BP 131/71 | HR 86 | Temp 99.0°F | Resp 16 | Ht 70.0 in | Wt 260.0 lb

## 2018-12-04 DIAGNOSIS — M7989 Other specified soft tissue disorders: Secondary | ICD-10-CM

## 2018-12-04 DIAGNOSIS — I871 Compression of vein: Secondary | ICD-10-CM

## 2018-12-04 NOTE — Progress Notes (Signed)
    Subjective:     Patient ID: Jane [name], female   DOB: 1965-12-06, 53 y.o.   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  She has had significant swelling of her left lower extremity since the surgery and drainage now from her groin wound.  She has been seen at [facility] where they were concerned for DVT.  She is taking her blood thinner which is Xarelto and also taking aspirin daily.  She denies any fevers.   Review of Systems Severe left lower extremity edema    Objective:   Physical Exam Awake alert oriented Left lower extremity significantly edematous and tight but compartments are soft There is a strong pulsatility over her mons pubis with Doppler    Assessment:     53 year old female status post left to right femoral femoral vein bypass for May Thurner syndrome.  By physical exam this appears to be patent but she does have significant swelling of the left lower extremity and groin drainage.    Plan:     I have offered her 2 options:  #1. we could return to the operating room to evaluate if the vein bypass is still patent and replaced with graft if not possibly redo her AV fistula which I do think is causing her swelling  #2.  Continue nonoperative management wear knee-high compression stockings walk as tolerated keep the groin dry and follow her up in 2 weeks.    She has chosen to follow-up in 2 more weeks.  If swelling and drainage persist she will call to have surgical intervention she does not need to be seen prior she can be scheduled and we will reevaluate her femorofemoral bypass from the left groin approach first possibly redoing it with graft possibly taking down her AV fistula which would likely put her back to her preoperative level of swelling but would be improved from her current level.  Brandon C. Donzetta Matters, MD [facility]  [place] Office: [phone] Pager: [phone]

## 2018-12-09 ENCOUNTER — Ambulatory Visit (HOSPITAL_COMMUNITY): Payer: Self-pay | Admitting: Licensed Clinical Social Worker

## 2018-12-15 ENCOUNTER — Ambulatory Visit (INDEPENDENT_AMBULATORY_CARE_PROVIDER_SITE_OTHER): Payer: Self-pay | Admitting: Physician Assistant

## 2018-12-15 ENCOUNTER — Encounter: Payer: Self-pay | Admitting: Physician Assistant

## 2018-12-15 ENCOUNTER — Other Ambulatory Visit: Payer: Self-pay

## 2018-12-15 VITALS — BP 135/78 | HR 98 | Temp 97.9°F | Resp 16 | Ht 70.0 in | Wt 265.0 lb

## 2018-12-15 DIAGNOSIS — I871 Compression of vein: Secondary | ICD-10-CM

## 2018-12-15 NOTE — Progress Notes (Signed)
    Postoperative Visit   History of Present Illness   Jane [name] is a 53 y.o. year old female who presents for postoperative follow-up status post left to right femoral to femoral vein bypass due to May Thurner lesion.  This was performed by Dr. Donzetta Matters on 11/24/2018.  She returns to office today with concern of nonhealing left groin incision.  She states that she has had greenish drainage with an odor.  She however states that the edema of her left lower extremity is improving.  She is not able to tolerate any compression left lower extremity however she is able to manage her edema with elevation.  She is taking her Xarelto and aspirin daily.  She denies any fevers, loss of appetite, nausea/vomiting.    For VQI Use Only   PRE-ADM LIVING: Home  AMB STATUS: Ambulatory   Physical Examination   Vitals:   12/15/18 1112  BP: 135/78  Pulse: 98  Resp: 16  Temp: 97.9 F (36.6 C)  TempSrc: Oral  SpO2: 98%  Weight: 265 lb (120.2 kg)  Height: 5\' 10"  (1.778 m)    BLE: Right groin incision well-healed; left groin incision nearly well-healed, small remaining raw area proximal aspect of incision without drainage or palpable underlying fluid collection; no erythema or other sign of infection noted; feet symmetrically warm to touch; left lower extremity still edematous   Medical Decision Making   Jane [name] is a 53 y.o. year old female who presents s/p left to right femoral to femoral vein bypass by Dr. Donzetta Matters 11/24/2018   Right groin incision well-healed; left groin incision unremarkable.  Very small remaining raw area at proximal aspect of left groin incision without sign of infection  I encouraged patient to cleanse with soap and water daily and pat to dry  Also encouraged to keep a dry gauze and left groin until completely healed  Edema improved LLE; continue elevation above heart when possible  Patient would like to keep her appointment on Friday with Dr. Donzetta Matters to further  discuss ligation of fistula in left groin    Dagoberto Ligas PA-C [facility] [place] Office: [phone]  Clinic MD: Dr. Carlis Abbott

## 2018-12-18 ENCOUNTER — Other Ambulatory Visit: Payer: Self-pay

## 2018-12-18 ENCOUNTER — Encounter: Payer: Self-pay | Admitting: Vascular Surgery

## 2018-12-18 ENCOUNTER — Ambulatory Visit: Payer: Self-pay | Admitting: Vascular Surgery

## 2018-12-18 ENCOUNTER — Ambulatory Visit (INDEPENDENT_AMBULATORY_CARE_PROVIDER_SITE_OTHER): Payer: Self-pay | Admitting: Vascular Surgery

## 2018-12-18 VITALS — BP 131/82 | HR 92 | Temp 98.3°F | Resp 20 | Ht 70.0 in | Wt 265.0 lb

## 2018-12-18 DIAGNOSIS — M7989 Other specified soft tissue disorders: Secondary | ICD-10-CM

## 2018-12-18 NOTE — Progress Notes (Signed)
    Subjective:     Patient ID: Jane [name], female   DOB: [birth year], 53 y.o.   MRN: [id number]  HPI 53 year old female underwent left to right femorofemoral vein bypass for excluded common external iliac veins on the left.  She was seen with persistent swelling of the left leg.  She is taking her Xarelto and aspirin daily.  States the swelling is improved.  No further drainage from left groin.  Review of Systems Improved left lower extremity swelling    Objective:   Physical Exam Vitals:   12/18/18 1601  BP: 131/82  Pulse: 92  Resp: 20  Temp: 98.3 F (36.8 C)  SpO2: 99%    Awake alert oriented Left lower extremity edema is improving Bilateral groins are well-healed without drainage Strong signal overlying her mons pubis consistent with patent femorofemoral graft    Assessment:     53 year old female follows up after left to right femorofemoral vein bypass for May Thurner syndrome.  Graft patent by physical exam and swelling improving.    Plan:     Follow-up in 3 months with femorofemoral venous duplex. I have cleared her for moderate level activity and she can begin swimming again in 3 to 4 weeks.  Dartanyan Deasis C. Donzetta Matters, MD [facility] [place] Office: [phone] Pager: [phone]

## 2018-12-22 ENCOUNTER — Other Ambulatory Visit: Payer: Self-pay | Admitting: *Deleted

## 2018-12-22 MED ORDER — RIVAROXABAN 20 MG PO TABS: 20.0000 mg | ORAL_TABLET | Freq: Every day | ORAL | 6 refills | Status: DC

## 2018-12-25 ENCOUNTER — Encounter: Payer: Self-pay | Admitting: Vascular Surgery

## 2018-12-25 ENCOUNTER — Encounter (HOSPITAL_COMMUNITY): Payer: Self-pay

## 2019-01-06 ENCOUNTER — Ambulatory Visit (HOSPITAL_COMMUNITY): Payer: Self-pay | Admitting: Licensed Clinical Social Worker

## 2019-01-18 ENCOUNTER — Telehealth (HOSPITAL_COMMUNITY): Payer: Self-pay | Admitting: Licensed Clinical Social Worker

## 2019-01-19 ENCOUNTER — Encounter (HOSPITAL_COMMUNITY): Payer: Self-pay | Admitting: Psychiatry

## 2019-01-19 ENCOUNTER — Ambulatory Visit (HOSPITAL_COMMUNITY): Payer: Medicaid Other | Admitting: Psychiatry

## 2019-01-19 ENCOUNTER — Other Ambulatory Visit: Payer: Self-pay

## 2019-01-19 VITALS — BP 150/75 | HR 84 | Ht 70.0 in | Wt 279.0 lb

## 2019-01-19 DIAGNOSIS — F3130 Bipolar disorder, current episode depressed, mild or moderate severity, unspecified: Secondary | ICD-10-CM | POA: Diagnosis not present

## 2019-01-19 DIAGNOSIS — Z79899 Other long term (current) drug therapy: Secondary | ICD-10-CM | POA: Diagnosis not present

## 2019-01-19 MED ORDER — ALPRAZOLAM 1 MG PO TABS: 1.0000 mg | ORAL_TABLET | Freq: Two times a day (BID) | ORAL | 2 refills | Status: DC | PRN

## 2019-01-19 MED ORDER — DULOXETINE HCL 60 MG PO CPEP: 60.0000 mg | ORAL_CAPSULE | Freq: Two times a day (BID) | ORAL | 2 refills | Status: DC

## 2019-01-19 NOTE — Progress Notes (Signed)
[facility] MD/PA/NP OP Progress Note  01/19/2019 11:51 AM Jane [name]  MRN:  [id number]  Chief Complaint:  Chief Complaint    Anxiety; Depression; Follow-up     HPI: This patient is a 53year-old divorced white female who lives alone in [place]. She is on disability due to bipolar disorder.  The patient states that she became very depressed when her mother died in 02-17-00 of a brain aneurysm. She started getting. Sad but eventually became manic and develop mood swings and agitated behavior. She's been on numerous medications over the years. In 1988-02-17 she also had postpartum depression. For many years she was on lithium but it caused a bad taste in her mouth and her doctor was worried it might be starting to affect her renal function so was discontinued. She also thinks that it stopped working after a number of years  The patient returns after 4 months.  Since I saw her she has had more vascular surgery done on her left leg.  Unfortunately her leg is been swelling.  She has an unrelated knee problem that is causing some swelling in her lower leg as well.  This is made it hard for her to move around.  Overall however her mood is pretty good.  She is back talking to her children and is been able to see the grandchildren.  For the most part she is sleeping okay.  She had a fall recently in the middle of the night and she is not sure why but she had large hematomas on her back and buttocks.  She had stopped taking Ambien a while ago so it was not related to this.  She denies suicidal ideation and is actually fairly upbeat given the circumstances. Visit Diagnosis:    ICD-10-CM   1. Bipolar I disorder, most recent episode depressed ([place]) F31.30     Past Psychiatric History: Long-term outpatient treatment  Past Medical History:  Past Medical History:  Diagnosis Date  . Anxiety   . Bipolar disorder ([place])   . Chronic kidney disease    Stage 3 kidney disease;dx by Dr. Sinda Du.   . Depression   . GERD  (gastroesophageal reflux disease)   . High cholesterol   . History of DVT (deep vein thrombosis)    left leg  . Hypertension    states under control with meds., has been on med. x 2 years  . Hypothyroidism   . Obsessive-compulsive disorder   . Restless leg syndrome   . Trigger thumb of left hand 02/16/2018  . Trigger thumb of right hand     Past Surgical History:  Procedure Laterality Date  . ABDOMINAL HYSTERECTOMY  06/2016   complete  . AV FISTULA PLACEMENT Left 11/24/2018   Procedure: ARTERIOVENOUS (AV) FISTULA CREATION LEFT SFA TO LEFT FEMORAL VEIN;  Surgeon: Waynetta Sandy, MD;  Location: [facility];  Service: Vascular;  Laterality: Left;  . CHOLECYSTECTOMY    . FEMORAL-FEMORAL BYPASS GRAFT Left 11/24/2018   Procedure: BYPASS GRAFT FEMORAL-FEMORAL VENOUS LEFT TO RIGHT PALMA PROCEDURE USING CRYOVEIN;  Surgeon: Waynetta Sandy, MD;  Location: [facility];  Service: Vascular;  Laterality: Left;  . LOWER EXTREMITY VENOGRAPHY N/A 08/17/2018   Procedure: LOWER EXTREMITY VENOGRAPHY - Central Venogram;  Surgeon: Waynetta Sandy, MD;  Location: [facility];  Service: Cardiovascular;  Laterality: N/A;  . LUMBAR FUSION  11/21/2000   L5-S1  . LUMBAR SPINE SURGERY     x 2 others  . TRIGGER FINGER RELEASE Right 12/01/2017  Procedure: RELEASE TRIGGER FINGER/A-1 PULLEY RIGHT THUMB;  Surgeon: Leanora Cover, MD;  Location: [facility];  Service: Orthopedics;  Laterality: Right;  . TRIGGER FINGER RELEASE Left 01/26/2018   Procedure: LEFT TRIGGER THUMB RELEASE;  Surgeon: Leanora Cover, MD;  Location: [facility];  Service: Orthopedics;  Laterality: Left;    Family Psychiatric History: See below  Family History:  Family History  Problem Relation Age of Onset  . Heart disease Mother   . Hyperlipidemia Mother   . Hypertension Mother   . Bipolar disorder Mother   . Diabetes Father   . Heart disease Father   . Hyperlipidemia Father   .  Hypertension Father   . Drug abuse Daughter   . ADD / ADHD Daughter   . Drug abuse Daughter   . Anxiety disorder Daughter   . Bipolar disorder Daughter   . Hypertension Sister   . Hypertension Brother   . Hyperlipidemia Brother   . Heart disease Brother   . Bipolar disorder Maternal Aunt   . Suicidality Maternal Aunt     Social History:  Social History   Socioeconomic History  . Marital status: Divorced    Spouse name: Not on file  . Number of children: Not on file  . Years of education: Not on file  . Highest education level: Not on file  Occupational History  . Not on file  Social Needs  . Financial resource strain: Not on file  . Food insecurity:    Worry: Not on file    Inability: Not on file  . Transportation needs:    Medical: Not on file    Non-medical: Not on file  Tobacco Use  . Smoking status: Never Smoker  . Smokeless tobacco: Never Used  Substance and Sexual Activity  . Alcohol use: No  . Drug use: No  . Sexual activity: Yes    Partners: Male    Birth control/protection: Surgical  Lifestyle  . Physical activity:    Days per week: Not on file    Minutes per session: Not on file  . Stress: Not on file  Relationships  . Social connections:    Talks on phone: Not on file    Gets together: Not on file    Attends religious service: Not on file    Active member of club or organization: Not on file    Attends meetings of clubs or organizations: Not on file    Relationship status: Not on file  Other Topics Concern  . Not on file  Social History Narrative  . Not on file    Allergies:  Allergies  Allergen Reactions  . Aripiprazole Other (See Comments)    BECOMES  VIOLENT   . Seroquel [Quetiapine Fumarate] Other (See Comments)    BECOMES VIOLENT  . Chlorpromazine Other (See Comments)    SEVERE ANXIETY   . Gabapentin Other (See Comments)    NIGHTMARES     Metabolic Disorder Labs: No results found for: HGBA1C, MPG No results found for:  PROLACTIN No results found for: CHOL, TRIG, HDL, CHOLHDL, VLDL, LDLCALC No results found for: TSH  Therapeutic Level Labs: No results found for: LITHIUM Lab Results  Component Value Date   VALPROATE 61.2 03/23/2014   No components found for:  CBMZ  Current Medications: Current Outpatient Medications  Medication Sig Dispense Refill  . ALPRAZolam (XANAX) 1 MG tablet Take 1 tablet (1 mg total) by mouth 2 (two) times daily as needed for anxiety. [place]  tablet 2  . amLODipine (NORVASC) 10 MG tablet Take 10 mg by mouth daily.     Marland Kitchen amphetamine-dextroamphetamine (ADDERALL) 30 MG tablet Take 1 tablet by mouth 2 (two) times daily. Fill after 10/27/18 60 tablet 0  . aspirin EC 81 MG tablet Take 81 mg by mouth daily.    Marland Kitchen aspirin-acetaminophen-caffeine (EXCEDRIN MIGRAINE) 250-250-65 MG tablet Take 2 tablets by mouth 2 (two) times daily as needed for headache or migraine.     Marland Kitchen atorvastatin (LIPITOR) 20 MG tablet Take 20 mg by mouth daily.    . cetirizine (ZYRTEC) 10 MG tablet Take 10 mg by mouth daily.    . cholecalciferol (VITAMIN D) 1000 units tablet Take 1,000 Units by mouth at bedtime.     . cycloSPORINE (RESTASIS) 0.05 % ophthalmic emulsion Place 1 drop into both eyes 2 (two) times daily.    . diclofenac sodium (VOLTAREN) 1 % GEL Apply 2-4 g topically 2 (two) times daily as needed (for foot pain.).    Marland Kitchen DULoxetine (CYMBALTA) 60 MG capsule Take 1 capsule (60 mg total) by mouth 2 (two) times daily. 60 capsule 2  . ELDERBERRY PO Take 50 mg by mouth daily.    . fluticasone (FLONASE) 50 MCG/ACT nasal spray Place 1 spray into both nostrils daily as needed for allergies or rhinitis.    . hydrochlorothiazide (HYDRODIURIL) 25 MG tablet Take 25 mg by mouth daily.    Marland Kitchen HYDROcodone-acetaminophen (NORCO) 10-325 MG tablet Take 1 tablet by mouth every 6 (six) hours as needed (for pain.).    Marland Kitchen ketoconazole (NIZORAL) 2 % cream Apply 1 application topically 2 (two) times daily as needed for irritation.     Marland Kitchen  KLOR-CON M10 10 MEQ tablet Take 10 mEq by mouth 3 (three) times daily.  12  . Krill Oil (OMEGA-3) 500 MG CAPS Take 500 mg by mouth at bedtime.    Marland Kitchen levothyroxine (SYNTHROID, LEVOTHROID) 137 MCG tablet Take 137 mcg by mouth daily before breakfast.    . linaclotide (LINZESS) 145 MCG CAPS capsule Take 145 mcg by mouth daily.     . Magnesium 250 MG TABS Take 250 mg by mouth daily.    . Melatonin 10 MG TBDP Take 10 mg by mouth at bedtime as needed (for sleep).    . Multiple Vitamin (MULTIVITAMIN WITH MINERALS) TABS tablet Take 1 tablet by mouth daily. One-A-Day Women's Multivitamin    . nystatin (MYCOSTATIN/NYSTOP) powder Apply 1 g topically 2 (two) times daily as needed (for skin irritation/rash).     . pantoprazole (PROTONIX) 40 MG tablet Take 40 mg by mouth daily.    Marland Kitchen PAZEO 0.7 % SOLN Place 1 drop into both eyes 2 (two) times daily.     . Polyvinyl Alcohol-Povidone (REFRESH OP) Place 1 drop into both eyes 4 (four) times daily.    . potassium chloride (K-DUR) 10 MEQ tablet Take 10 mEq by mouth 3 (three) times daily.    . rivaroxaban (XARELTO) 20 MG TABS tablet Take 1 tablet (20 mg total) by mouth daily with supper. 30 tablet 6  . rOPINIRole (REQUIP) 2 MG tablet Take 2 mg by mouth 2 (two) times daily.     No current facility-administered medications for this visit.      Musculoskeletal: Strength & Muscle Tone: Decreased Gait & Station: unsteady Patient leans: N/A  Psychiatric Specialty Exam: Review of Systems  Musculoskeletal: Positive for back pain, falls and joint pain.  All other systems reviewed and are negative.   Blood pressure Marland Kitchen)  150/75, pulse 84, height 5\' 10"  (1.778 m), weight 279 lb (126.6 kg), SpO2 95 %.Body mass index is 40.03 kg/m.  General Appearance: Casual and Fairly Groomed  Eye Contact:  Good  Speech:  Clear and Coherent  Volume:  Normal  Mood:  Anxious  Affect:  Appropriate and Congruent  Thought Process:  Goal Directed  Orientation:  Full (Time, Place, and  Person)  Thought Content: Rumination   Suicidal Thoughts:  No  Homicidal Thoughts:  No  Memory:  Immediate;   Good Recent;   Good Remote;   Fair  Judgement:  Fair  Insight:  Fair  Psychomotor Activity:  Decreased  Concentration:  Concentration: Good and Attention Span: Good  Recall:  Good  Fund of Knowledge: Fair  Language: Good  Akathisia:  No  Handed:  Right  AIMS (if indicated): not done  Assets:  Communication Skills Desire for Improvement Resilience Social Support Talents/Skills  ADL's:  Intact  Cognition: WNL  Sleep:  Fair   Screenings: PHQ2-9     Nutrition from 09/22/2014 in [facility]  PHQ-2 Total Score  4  PHQ-9 Total Score  15       Assessment and Plan: This patient is a 53 year old female with a history of presumed bipolar disorder but primarily has symptoms of depression and anxiety.  Given all of her medical issues she seems to be holding her own.  She will continue Cymbalta 60 mg twice daily for depression and Xanax 1 mg 2 times daily as needed for anxiety.  She receives Adderall 30 mg twice daily for ADD from her primary physician.  She will return to see me in 2 months   [name], MD 01/19/2019, 11:51 AM

## 2019-02-10 ENCOUNTER — Ambulatory Visit (HOSPITAL_COMMUNITY): Payer: Self-pay | Admitting: Licensed Clinical Social Worker

## 2019-02-19 ENCOUNTER — Telehealth: Payer: Self-pay | Admitting: Vascular Surgery

## 2019-02-19 NOTE — Telephone Encounter (Signed)
sch appt lvm mld ltr 03/05/2019 9am f/u MD

## 2019-02-19 NOTE — Telephone Encounter (Signed)
-----   Message from Waynetta Sandy, MD sent at 02/19/2019 11:01 AM EDT ----- I need to see her in the next week or 2 without studies.   Erlene Quan

## 2019-03-03 ENCOUNTER — Other Ambulatory Visit: Payer: Self-pay

## 2019-03-03 DIAGNOSIS — I871 Compression of vein: Secondary | ICD-10-CM

## 2019-03-04 ENCOUNTER — Telehealth (HOSPITAL_COMMUNITY): Payer: Self-pay | Admitting: Rehabilitation

## 2019-03-04 NOTE — Telephone Encounter (Signed)
The above patient or their representative was contacted and gave the following answers to these questions:         Do you have any of the following symptoms? No  Fever                    Cough                   Shortness of breath  Do  you have any of the following other symptoms? No   muscle pain         vomiting,        diarrhea        rash         weakness        red eye        abdominal pain         bruising          bruising or bleeding              joint pain           severe headache    Have you been in contact with someone who was or has been sick in the past 2 weeks? No  Yes                 Unsure                         Unable to assess   Does the person that you were in contact with have any of the following symptoms?   Cough         shortness of breath           muscle pain         vomiting,            diarrhea            rash            weakness           fever            red eye           abdominal pain           bruising  or  bleeding                joint pain                severe headache               Have you  or someone you have been in contact with traveled internationally in th last month? No         If yes, which countries?   Have you  or someone you have been in contact with traveled outside [place] in th last month? No        If yes, which state and city?   COMMENTS OR ACTION PLAN FOR THIS PATIENT:  Pt will wear a mask for her appt.

## 2019-03-05 ENCOUNTER — Encounter: Payer: Self-pay | Admitting: Vascular Surgery

## 2019-03-05 ENCOUNTER — Other Ambulatory Visit: Payer: Self-pay

## 2019-03-05 ENCOUNTER — Encounter: Payer: Self-pay | Admitting: *Deleted

## 2019-03-05 ENCOUNTER — Ambulatory Visit (INDEPENDENT_AMBULATORY_CARE_PROVIDER_SITE_OTHER): Payer: Medicaid Other | Admitting: Vascular Surgery

## 2019-03-05 VITALS — BP 144/83 | HR 78 | Temp 97.6°F | Resp 16 | Ht 70.0 in | Wt 284.0 lb

## 2019-03-05 DIAGNOSIS — M7989 Other specified soft tissue disorders: Secondary | ICD-10-CM | POA: Diagnosis not present

## 2019-03-05 DIAGNOSIS — I87002 Postthrombotic syndrome without complications of left lower extremity: Secondary | ICD-10-CM | POA: Diagnosis not present

## 2019-03-05 NOTE — Progress Notes (Signed)
Patient ID: Jane [name], female   DOB: Feb 24, 1966, 53 y.o.   MRN: [id number]  Reason for Consult: Follow-up (left leg swelling)   Referred by Sinda Du, MD  Subjective:     HPI:  Jane [name] is a 53 y.o. female history of left lower extremity chronic swelling.  She has undergone left to right femoral to femoral vein bypass with creation of AV fistula.  Swelling was initially improving but now much worse over the past 4 weeks.  She did attempt water aerobics but the [facility] is now closed.  She remains on Xarelto.  She has had some bleeding that was either from her groins or from her vagina but she states that her wounds are well-healed.  She is otherwise not had any issues.  Past Medical History:  Diagnosis Date  . Anxiety   . Bipolar disorder ([place])   . Chronic kidney disease    Stage 3 kidney disease;dx by Dr. Sinda Du.   . Depression   . GERD (gastroesophageal reflux disease)   . High cholesterol   . History of DVT (deep vein thrombosis)    left leg  . Hypertension    states under control with meds., has been on med. x 2 years  . Hypothyroidism   . Obsessive-compulsive disorder   . Restless leg syndrome   . Trigger thumb of left hand 01/2018  . Trigger thumb of right hand    Family History  Problem Relation Age of Onset  . Heart disease Mother   . Hyperlipidemia Mother   . Hypertension Mother   . Bipolar disorder Mother   . Diabetes Father   . Heart disease Father   . Hyperlipidemia Father   . Hypertension Father   . Drug abuse Daughter   . ADD / ADHD Daughter   . Drug abuse Daughter   . Anxiety disorder Daughter   . Bipolar disorder Daughter   . Hypertension Sister   . Hypertension Brother   . Hyperlipidemia Brother   . Heart disease Brother   . Bipolar disorder Maternal Aunt   . Suicidality Maternal Aunt    Past Surgical History:  Procedure Laterality Date  . ABDOMINAL HYSTERECTOMY  06/2016   complete  . AV FISTULA PLACEMENT Left  11/24/2018   Procedure: ARTERIOVENOUS (AV) FISTULA CREATION LEFT SFA TO LEFT FEMORAL VEIN;  Surgeon: Waynetta Sandy, MD;  Location: [facility];  Service: Vascular;  Laterality: Left;  . CHOLECYSTECTOMY    . FEMORAL-FEMORAL BYPASS GRAFT Left 11/24/2018   Procedure: BYPASS GRAFT FEMORAL-FEMORAL VENOUS LEFT TO RIGHT PALMA PROCEDURE USING CRYOVEIN;  Surgeon: Waynetta Sandy, MD;  Location: [facility];  Service: Vascular;  Laterality: Left;  . LOWER EXTREMITY VENOGRAPHY N/A 08/17/2018   Procedure: LOWER EXTREMITY VENOGRAPHY - Central Venogram;  Surgeon: Waynetta Sandy, MD;  Location: [facility];  Service: Cardiovascular;  Laterality: N/A;  . LUMBAR FUSION  11/21/2000   L5-S1  . LUMBAR SPINE SURGERY     x 2 others  . TRIGGER FINGER RELEASE Right 12/01/2017   Procedure: RELEASE TRIGGER FINGER/A-1 PULLEY RIGHT THUMB;  Surgeon: Leanora Cover, MD;  Location: [facility];  Service: Orthopedics;  Laterality: Right;  . TRIGGER FINGER RELEASE Left 01/26/2018   Procedure: LEFT TRIGGER THUMB RELEASE;  Surgeon: Leanora Cover, MD;  Location: [facility];  Service: Orthopedics;  Laterality: Left;    Short Social History:  Social History   Tobacco Use  . Smoking status: Never Smoker  .  Smokeless tobacco: Never Used  Substance Use Topics  . Alcohol use: No    Allergies  Allergen Reactions  . Aripiprazole Other (See Comments)    BECOMES  VIOLENT   . Quetiapine Fumarate Other (See Comments)    Other reaction(s): Other (See Comments) BECOMES VIOLENT  . Seroquel [Quetiapine Fumarate] Other (See Comments)    BECOMES VIOLENT  . Chlorpromazine Other (See Comments)    SEVERE ANXIETY   . Gabapentin Other (See Comments)    NIGHTMARES     Current Outpatient Medications  Medication Sig Dispense Refill  . ALPRAZolam (XANAX) 1 MG tablet Take 1 tablet (1 mg total) by mouth 2 (two) times daily as needed for anxiety. 60 tablet 2  .  amphetamine-dextroamphetamine (ADDERALL) 30 MG tablet Take 1 tablet by mouth 2 (two) times daily. Fill after 10/27/18 60 tablet 0  . aspirin-acetaminophen-caffeine (EXCEDRIN MIGRAINE) 250-250-65 MG tablet Take 2 tablets by mouth 2 (two) times daily as needed for headache or migraine.     Marland Kitchen atorvastatin (LIPITOR) 20 MG tablet Take 20 mg by mouth daily.    . cetirizine (ZYRTEC) 10 MG tablet Take 10 mg by mouth daily.    . cholecalciferol (VITAMIN D) 1000 units tablet Take 1,000 Units by mouth at bedtime.     . cycloSPORINE (RESTASIS) 0.05 % ophthalmic emulsion Place 1 drop into both eyes 2 (two) times daily.    . DULoxetine (CYMBALTA) 60 MG capsule Take 1 capsule (60 mg total) by mouth 2 (two) times daily. 60 capsule 2  . glipiZIDE (GLUCOTROL XL) 5 MG 24 hr tablet Take 5 mg by mouth daily with breakfast.    . hydrochlorothiazide (HYDRODIURIL) 25 MG tablet Take 25 mg by mouth daily.    Marland Kitchen HYDROcodone-acetaminophen (NORCO) 10-325 MG tablet Take 1 tablet by mouth every 6 (six) hours as needed (for pain.).    Marland Kitchen KLOR-CON M10 10 MEQ tablet Take 10 mEq by mouth 3 (three) times daily.  12  . Krill Oil (OMEGA-3) 500 MG CAPS Take 500 mg by mouth at bedtime.    Marland Kitchen levothyroxine (SYNTHROID, LEVOTHROID) 137 MCG tablet Take 137 mcg by mouth daily before breakfast.    . linaclotide (LINZESS) 145 MCG CAPS capsule Take 145 mcg by mouth daily.     . Magnesium 250 MG TABS Take 250 mg by mouth daily.    . Melatonin 10 MG TBDP Take 10 mg by mouth at bedtime as needed (for sleep).    . Multiple Vitamin (MULTIVITAMIN WITH MINERALS) TABS tablet Take 1 tablet by mouth daily. One-A-Day Women's Multivitamin    . pantoprazole (PROTONIX) 40 MG tablet Take 40 mg by mouth daily.    Marland Kitchen PAZEO 0.7 % SOLN Place 1 drop into both eyes 2 (two) times daily.     . Polyvinyl Alcohol-Povidone (REFRESH OP) Place 1 drop into both eyes 4 (four) times daily.    . potassium chloride (K-DUR) 10 MEQ tablet Take 10 mEq by mouth 3 (three) times daily.     . pregabalin (LYRICA) 100 MG capsule Take 100 mg by mouth 3 (three) times daily.    . rivaroxaban (XARELTO) 20 MG TABS tablet Take 1 tablet (20 mg total) by mouth daily with supper. 30 tablet 6  . rOPINIRole (REQUIP) 2 MG tablet Take 2 mg by mouth 2 (two) times daily.    Marland Kitchen amLODipine (NORVASC) 10 MG tablet Take 10 mg by mouth daily.     Marland Kitchen aspirin EC 81 MG tablet Take 81 mg by mouth  daily.    . diclofenac sodium (VOLTAREN) 1 % GEL Apply 2-4 g topically 2 (two) times daily as needed (for foot pain.).    Marland Kitchen ELDERBERRY PO Take 50 mg by mouth daily.    . fluticasone (FLONASE) 50 MCG/ACT nasal spray Place 1 spray into both nostrils daily as needed for allergies or rhinitis.    Marland Kitchen ketoconazole (NIZORAL) 2 % cream Apply 1 application topically 2 (two) times daily as needed for irritation.     Marland Kitchen nystatin (MYCOSTATIN/NYSTOP) powder Apply 1 g topically 2 (two) times daily as needed (for skin irritation/rash).      No current facility-administered medications for this visit.     Review of Systems  Constitutional:  Constitutional negative. HENT: HENT negative.  Eyes: Eyes negative.  Respiratory: Positive for shortness of breath.  Cardiovascular: Positive for leg swelling.  Musculoskeletal: Positive for leg pain.  Neurological: Neurological negative. Hematologic: Positive for bruises/bleeds easily.  Psychiatric: Positive for depressed mood.        Objective:  Objective   Vitals:   03/05/19 0901  BP: (!) 144/83  Pulse: 78  Resp: 16  Temp: 97.6 F (36.4 C)  TempSrc: Oral  SpO2: 100%  Weight: 284 lb (128.8 kg)  Height: 5\' 10"  (1.778 m)   Body mass index is 40.75 kg/m.  Physical Exam HENT:     Head: Normocephalic.  Eyes:     Pupils: Pupils are equal, round, and reactive to light.  Cardiovascular:     Pulses:          Dorsalis pedis pulses are 2+ on the right side.       Posterior tibial pulses are 2+ on the right side.     Comments: I cannot feel distal left pulses due to  swelling  There is a strong venous signal on her mons pubis Pulmonary:     Effort: Pulmonary effort is normal.  Abdominal:     General: Abdomen is flat.  Musculoskeletal:     Left lower leg: Edema present.     Comments: Right lower extremity measures 39-1/2 cm right 50 cm (10 cm beyond the tibial tuberosity)  Skin:    Capillary Refill: Capillary refill takes less than 2 seconds.  Neurological:     General: No focal deficit present.     Mental Status: She is alert.  Psychiatric:        Mood and Affect: Mood normal.        Behavior: Behavior normal.        Thought Content: Thought content normal.        Judgment: Judgment normal.     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  Given her persistent swelling we will repeat venogram in a prone position.  She can take Xarelto the day prior.  We will get this done ASAP.     Waynetta Sandy MD [facility]

## 2019-03-09 ENCOUNTER — Encounter (HOSPITAL_COMMUNITY)
Admission: RE | Disposition: A | Discharge status: Home / Self Care | Payer: Self-pay | Source: Home / Self Care | Attending: Vascular Surgery

## 2019-03-09 ENCOUNTER — Encounter (HOSPITAL_COMMUNITY): Payer: Self-pay | Admitting: Vascular Surgery

## 2019-03-09 ENCOUNTER — Other Ambulatory Visit: Payer: Self-pay

## 2019-03-09 ENCOUNTER — Observation Stay (HOSPITAL_COMMUNITY)
Admission: RE | Admit: 2019-03-09 | Discharge: 2019-03-10 | Disposition: A | Discharge status: Home / Self Care | Payer: Medicaid Other | Attending: Vascular Surgery | Admitting: Vascular Surgery

## 2019-03-09 DIAGNOSIS — Z95820 Peripheral vascular angioplasty status with implants and grafts: Secondary | ICD-10-CM | POA: Insufficient documentation

## 2019-03-09 DIAGNOSIS — Z79899 Other long term (current) drug therapy: Secondary | ICD-10-CM | POA: Insufficient documentation

## 2019-03-09 DIAGNOSIS — I871 Compression of vein: Secondary | ICD-10-CM

## 2019-03-09 DIAGNOSIS — Z8249 Family history of ischemic heart disease and other diseases of the circulatory system: Secondary | ICD-10-CM | POA: Diagnosis not present

## 2019-03-09 DIAGNOSIS — Z90710 Acquired absence of both cervix and uterus: Secondary | ICD-10-CM | POA: Diagnosis not present

## 2019-03-09 DIAGNOSIS — I872 Venous insufficiency (chronic) (peripheral): Secondary | ICD-10-CM | POA: Diagnosis not present

## 2019-03-09 DIAGNOSIS — G2581 Restless legs syndrome: Secondary | ICD-10-CM | POA: Insufficient documentation

## 2019-03-09 DIAGNOSIS — E039 Hypothyroidism, unspecified: Secondary | ICD-10-CM | POA: Insufficient documentation

## 2019-03-09 DIAGNOSIS — Z7984 Long term (current) use of oral hypoglycemic drugs: Secondary | ICD-10-CM | POA: Insufficient documentation

## 2019-03-09 DIAGNOSIS — F429 Obsessive-compulsive disorder, unspecified: Secondary | ICD-10-CM | POA: Diagnosis not present

## 2019-03-09 DIAGNOSIS — Z7901 Long term (current) use of anticoagulants: Secondary | ICD-10-CM | POA: Insufficient documentation

## 2019-03-09 DIAGNOSIS — I129 Hypertensive chronic kidney disease with stage 1 through stage 4 chronic kidney disease, or unspecified chronic kidney disease: Secondary | ICD-10-CM | POA: Diagnosis not present

## 2019-03-09 DIAGNOSIS — N183 Chronic kidney disease, stage 3 (moderate): Secondary | ICD-10-CM | POA: Insufficient documentation

## 2019-03-09 DIAGNOSIS — Z7982 Long term (current) use of aspirin: Secondary | ICD-10-CM | POA: Insufficient documentation

## 2019-03-09 DIAGNOSIS — Z888 Allergy status to other drugs, medicaments and biological substances status: Secondary | ICD-10-CM | POA: Diagnosis not present

## 2019-03-09 DIAGNOSIS — F319 Bipolar disorder, unspecified: Secondary | ICD-10-CM | POA: Insufficient documentation

## 2019-03-09 DIAGNOSIS — Z86718 Personal history of other venous thrombosis and embolism: Secondary | ICD-10-CM | POA: Diagnosis present

## 2019-03-09 DIAGNOSIS — Z818 Family history of other mental and behavioral disorders: Secondary | ICD-10-CM | POA: Insufficient documentation

## 2019-03-09 DIAGNOSIS — Z79890 Hormone replacement therapy (postmenopausal): Secondary | ICD-10-CM | POA: Insufficient documentation

## 2019-03-09 DIAGNOSIS — E7800 Pure hypercholesterolemia, unspecified: Secondary | ICD-10-CM | POA: Insufficient documentation

## 2019-03-09 DIAGNOSIS — I82422 Acute embolism and thrombosis of left iliac vein: Secondary | ICD-10-CM | POA: Diagnosis not present

## 2019-03-09 DIAGNOSIS — K219 Gastro-esophageal reflux disease without esophagitis: Secondary | ICD-10-CM | POA: Diagnosis not present

## 2019-03-09 DIAGNOSIS — I82409 Acute embolism and thrombosis of unspecified deep veins of unspecified lower extremity: Secondary | ICD-10-CM | POA: Diagnosis present

## 2019-03-09 HISTORY — PX: LOWER EXTREMITY VENOGRAPHY: CATH118253

## 2019-03-09 LAB — POCT I-STAT 4, (NA,K, GLUC, HGB,HCT)
Glucose, Bld: 112 mg/dL — ABNORMAL HIGH (ref 70–99)
HCT: 34 % — ABNORMAL LOW (ref 36.0–46.0)
Hemoglobin: 11.6 g/dL — ABNORMAL LOW (ref 12.0–15.0)
Potassium: 3.5 mmol/L (ref 3.5–5.1)
Sodium: 142 mmol/L (ref 135–145)

## 2019-03-09 LAB — APTT: aPTT: 36 seconds (ref 24–36)

## 2019-03-09 LAB — POCT I-STAT CREATININE: Creatinine, Ser: 1.3 mg/dL — ABNORMAL HIGH (ref 0.44–1.00)

## 2019-03-09 SURGERY — LOWER EXTREMITY VENOGRAPHY
Anesthesia: LOCAL | Laterality: Bilateral

## 2019-03-09 MED ORDER — HEPARIN (PORCINE) 25000 UT/250ML-% IV SOLN
1700.0000 [IU]/h | INTRAVENOUS | Status: AC
  Administered 2019-03-09: 20:00:00 1400 [IU]/h via INTRAVENOUS
  Filled 2019-03-09: qty 250

## 2019-03-09 MED ORDER — VITAMIN C 500 MG PO TABS
500.0000 mg | ORAL_TABLET | Freq: Every day | ORAL | Status: DC
  Administered 2019-03-09: 500 mg via ORAL
  Filled 2019-03-09: qty 1

## 2019-03-09 MED ORDER — LINACLOTIDE 145 MCG PO CAPS
145.0000 ug | ORAL_CAPSULE | Freq: Every day | ORAL | Status: DC
  Administered 2019-03-10: 09:00:00 145 ug via ORAL
  Filled 2019-03-09: qty 1

## 2019-03-09 MED ORDER — ATORVASTATIN CALCIUM 10 MG PO TABS
20.0000 mg | ORAL_TABLET | Freq: Every day | ORAL | Status: DC
  Administered 2019-03-10: 09:00:00 20 mg via ORAL
  Filled 2019-03-09: qty 2

## 2019-03-09 MED ORDER — MIDAZOLAM HCL 2 MG/2ML IJ SOLN
INTRAMUSCULAR | Status: AC
  Filled 2019-03-09: qty 2

## 2019-03-09 MED ORDER — HEPARIN SODIUM (PORCINE) 1000 UNIT/ML IJ SOLN
INTRAMUSCULAR | Status: AC
  Filled 2019-03-09: qty 1

## 2019-03-09 MED ORDER — LIDOCAINE HCL (PF) 1 % IJ SOLN
INTRAMUSCULAR | Status: DC | PRN
  Administered 2019-03-09: 30 mL

## 2019-03-09 MED ORDER — ADULT MULTIVITAMIN W/MINERALS CH
1.0000 | ORAL_TABLET | Freq: Every day | ORAL | Status: DC
  Administered 2019-03-10: 09:00:00 1 via ORAL
  Filled 2019-03-09: qty 1

## 2019-03-09 MED ORDER — SODIUM CHLORIDE 0.9 % IV SOLN: 250.0000 mL | INTRAVENOUS | Status: DC | PRN

## 2019-03-09 MED ORDER — OXYCODONE HCL 5 MG PO TABS
5.0000 mg | ORAL_TABLET | ORAL | Status: DC | PRN
  Administered 2019-03-09: 10 mg via ORAL
  Filled 2019-03-09: qty 2

## 2019-03-09 MED ORDER — FENTANYL CITRATE (PF) 100 MCG/2ML IJ SOLN
INTRAMUSCULAR | Status: AC
  Filled 2019-03-09: qty 2

## 2019-03-09 MED ORDER — SODIUM CHLORIDE 0.9 % IV SOLN
INTRAVENOUS | Status: AC
  Administered 2019-03-09: 14:00:00 via INTRAVENOUS

## 2019-03-09 MED ORDER — CYCLOSPORINE 0.05 % OP EMUL
1.0000 [drp] | Freq: Two times a day (BID) | OPHTHALMIC | Status: DC
  Administered 2019-03-10: 09:00:00 1 [drp] via OPHTHALMIC
  Filled 2019-03-09 (×2): qty 30

## 2019-03-09 MED ORDER — HEPARIN SODIUM (PORCINE) 1000 UNIT/ML IJ SOLN
INTRAMUSCULAR | Status: DC | PRN
  Administered 2019-03-09: 10000 [IU] via INTRAVENOUS

## 2019-03-09 MED ORDER — ROPINIROLE HCL 1 MG PO TABS
2.0000 mg | ORAL_TABLET | Freq: Two times a day (BID) | ORAL | Status: DC
  Administered 2019-03-09 – 2019-03-10 (×2): 2 mg via ORAL
  Filled 2019-03-09 (×2): qty 2

## 2019-03-09 MED ORDER — LIDOCAINE HCL (PF) 1 % IJ SOLN
INTRAMUSCULAR | Status: AC
  Filled 2019-03-09: qty 30

## 2019-03-09 MED ORDER — HEPARIN (PORCINE) IN NACL 1000-0.9 UT/500ML-% IV SOLN
INTRAVENOUS | Status: AC
  Filled 2019-03-09: qty 1000

## 2019-03-09 MED ORDER — ALPRAZOLAM 0.5 MG PO TABS: 1.0000 mg | ORAL_TABLET | Freq: Two times a day (BID) | ORAL | Status: DC | PRN

## 2019-03-09 MED ORDER — MELATONIN 3 MG PO TABS
9.0000 mg | ORAL_TABLET | Freq: Every evening | ORAL | Status: DC | PRN
  Filled 2019-03-09: qty 3

## 2019-03-09 MED ORDER — AMPHETAMINE-DEXTROAMPHETAMINE 30 MG PO TABS: 30.0000 mg | ORAL_TABLET | Freq: Two times a day (BID) | ORAL | Status: DC

## 2019-03-09 MED ORDER — DULOXETINE HCL 60 MG PO CPEP
60.0000 mg | ORAL_CAPSULE | Freq: Two times a day (BID) | ORAL | Status: DC
  Administered 2019-03-09 – 2019-03-10 (×2): 60 mg via ORAL
  Filled 2019-03-09 (×2): qty 1

## 2019-03-09 MED ORDER — ASPIRIN EC 81 MG PO TBEC
81.0000 mg | DELAYED_RELEASE_TABLET | Freq: Every day | ORAL | Status: DC
  Administered 2019-03-10: 81 mg via ORAL
  Filled 2019-03-09: qty 1

## 2019-03-09 MED ORDER — OLOPATADINE HCL 0.1 % OP SOLN
1.0000 [drp] | Freq: Every day | OPHTHALMIC | Status: DC
  Filled 2019-03-09: qty 5

## 2019-03-09 MED ORDER — TRIAMCINOLONE ACETONIDE 0.1 % MT PSTE
1.0000 "application " | PASTE | Freq: Four times a day (QID) | OROMUCOSAL | Status: DC | PRN
  Filled 2019-03-09: qty 5

## 2019-03-09 MED ORDER — SODIUM CHLORIDE 0.9% FLUSH: 3.0000 mL | INTRAVENOUS | Status: DC | PRN

## 2019-03-09 MED ORDER — PANTOPRAZOLE SODIUM 40 MG PO TBEC
40.0000 mg | DELAYED_RELEASE_TABLET | Freq: Every day | ORAL | Status: DC
  Administered 2019-03-10: 09:00:00 40 mg via ORAL
  Filled 2019-03-09: qty 1

## 2019-03-09 MED ORDER — SODIUM CHLORIDE 0.9% FLUSH: 3.0000 mL | Freq: Two times a day (BID) | INTRAVENOUS | Status: DC

## 2019-03-09 MED ORDER — POTASSIUM CHLORIDE ER 10 MEQ PO TBCR
10.0000 meq | EXTENDED_RELEASE_TABLET | Freq: Three times a day (TID) | ORAL | Status: DC
  Administered 2019-03-09 – 2019-03-10 (×2): 10 meq via ORAL
  Filled 2019-03-09 (×5): qty 1

## 2019-03-09 MED ORDER — PREGABALIN 100 MG PO CAPS
100.0000 mg | ORAL_CAPSULE | Freq: Three times a day (TID) | ORAL | Status: DC
  Administered 2019-03-09 – 2019-03-10 (×2): 100 mg via ORAL
  Filled 2019-03-09 (×2): qty 1

## 2019-03-09 MED ORDER — IODIXANOL 320 MG/ML IV SOLN
INTRAVENOUS | Status: DC | PRN
  Administered 2019-03-09: 70 mL via INTRAVENOUS

## 2019-03-09 MED ORDER — HEPARIN (PORCINE) IN NACL 1000-0.9 UT/500ML-% IV SOLN
INTRAVENOUS | Status: DC | PRN
  Administered 2019-03-09 (×2): 500 mL

## 2019-03-09 MED ORDER — VITAMIN D 25 MCG (1000 UNIT) PO TABS
5000.0000 [IU] | ORAL_TABLET | Freq: Every day | ORAL | Status: DC
  Administered 2019-03-09: 22:00:00 5000 [IU] via ORAL
  Filled 2019-03-09: qty 5

## 2019-03-09 MED ORDER — GLIPIZIDE ER 5 MG PO TB24
5.0000 mg | ORAL_TABLET | Freq: Every day | ORAL | Status: DC
  Administered 2019-03-10: 09:00:00 5 mg via ORAL
  Filled 2019-03-09: qty 1

## 2019-03-09 MED ORDER — HYDROMORPHONE HCL 1 MG/ML IJ SOLN: 0.5000 mg | INTRAMUSCULAR | Status: DC | PRN

## 2019-03-09 MED ORDER — LEVOTHYROXINE SODIUM 25 MCG PO TABS
137.0000 ug | ORAL_TABLET | Freq: Every day | ORAL | Status: DC
  Administered 2019-03-10: 137 ug via ORAL
  Filled 2019-03-09: qty 1

## 2019-03-09 MED ORDER — LORATADINE 10 MG PO TABS
10.0000 mg | ORAL_TABLET | Freq: Every day | ORAL | Status: DC
  Administered 2019-03-10: 09:00:00 10 mg via ORAL
  Filled 2019-03-09: qty 1

## 2019-03-09 MED ORDER — SODIUM CHLORIDE 0.9 % IV SOLN
INTRAVENOUS | Status: DC
  Administered 2019-03-09: 10:00:00 via INTRAVENOUS

## 2019-03-09 MED ORDER — ONDANSETRON HCL 4 MG/2ML IJ SOLN: 4.0000 mg | Freq: Four times a day (QID) | INTRAMUSCULAR | Status: DC | PRN

## 2019-03-09 MED ORDER — FENTANYL CITRATE (PF) 100 MCG/2ML IJ SOLN
INTRAMUSCULAR | Status: DC | PRN
  Administered 2019-03-09: 50 ug via INTRAVENOUS
  Administered 2019-03-09 (×2): 25 ug via INTRAVENOUS

## 2019-03-09 MED ORDER — HYDROCHLOROTHIAZIDE 25 MG PO TABS
25.0000 mg | ORAL_TABLET | Freq: Every day | ORAL | Status: DC
  Administered 2019-03-10: 25 mg via ORAL
  Filled 2019-03-09: qty 1

## 2019-03-09 MED ORDER — LABETALOL HCL 5 MG/ML IV SOLN: 10.0000 mg | INTRAVENOUS | Status: DC | PRN

## 2019-03-09 MED ORDER — MAGNESIUM 250 MG PO TABS: 250.0000 mg | ORAL_TABLET | Freq: Every day | ORAL | Status: DC

## 2019-03-09 MED ORDER — HYDROCODONE-ACETAMINOPHEN 10-325 MG PO TABS
1.0000 | ORAL_TABLET | Freq: Four times a day (QID) | ORAL | Status: DC | PRN
  Administered 2019-03-09: 1 via ORAL
  Filled 2019-03-09: qty 1

## 2019-03-09 MED ORDER — ACETAMINOPHEN 325 MG PO TABS: 650.0000 mg | ORAL_TABLET | ORAL | Status: DC | PRN

## 2019-03-09 MED ORDER — HYDRALAZINE HCL 20 MG/ML IJ SOLN: 5.0000 mg | INTRAMUSCULAR | Status: DC | PRN

## 2019-03-09 MED ORDER — MIDAZOLAM HCL 2 MG/2ML IJ SOLN
INTRAMUSCULAR | Status: DC | PRN
  Administered 2019-03-09 (×3): 1 mg via INTRAVENOUS

## 2019-03-09 SURGICAL SUPPLY — 17 items
BAG SNAP BAND KOVER 36X36 (MISCELLANEOUS) ×2 IMPLANT
CATH ANGIO 5F BER 100CM (CATHETERS) ×2 IMPLANT
CATH ANGIO 5F BER2 65CM (CATHETERS) ×2 IMPLANT
CATH QUICKCROSS .035X135CM (MICROCATHETER) ×2 IMPLANT
CATH SOFT-VU ST 4F 90CM (CATHETERS) ×2 IMPLANT
CATH TEMPO AQUA 5F 100CM (CATHETERS) ×2 IMPLANT
CATH VISIONS PV .035 IVUS (CATHETERS) ×2 IMPLANT
COVER DOME SNAP 22 D (MISCELLANEOUS) ×2 IMPLANT
DEVICE TORQUE .025-.038 (MISCELLANEOUS) ×2 IMPLANT
GLIDEWIRE ADV .035X260CM (WIRE) ×2 IMPLANT
KIT MICROPUNCTURE NIT STIFF (SHEATH) ×2 IMPLANT
KIT PV (KITS) ×2 IMPLANT
SHEATH PINNACLE 5F 10CM (SHEATH) ×4 IMPLANT
SHEATH PINNACLE 8F 10CM (SHEATH) ×2 IMPLANT
SHEATH PROBE COVER 6X72 (BAG) ×2 IMPLANT
TRAY PV CATH (CUSTOM PROCEDURE TRAY) ×2 IMPLANT
WIRE ROSEN-J .035X260CM (WIRE) ×2 IMPLANT

## 2019-03-09 NOTE — Op Note (Signed)
Patient name: Jane [name] MRN: [id number] DOB: Apr 30, 1966 Sex: female  03/09/2019 Pre-operative Diagnosis: Left common external iliac vein occlusion Post-operative diagnosis:  Same Surgeon:  Erlene Quan C. Donzetta Matters, MD Procedure Performed: 1.  Ultrasound-guided cannulation left small saphenous vein 2.  Left lower extremity venogram 3.  Ultrasound-guided cannulation right small saphenous vein and right popliteal vein 4.  Right lower extremity venogram and IVC venogram 5.  Intravascular ultrasound of IVC, right common iliac and external iliac veins and common femoral and femoral veins 6.  Moderate sedation with fentanyl and Versed for 78 minutes  Indications: 53 year old female with history of occluded left common external iliac veins.  She has previously undergone a femoral to femoral vein bypass but has failed and she has worsened swelling of the left lower extremity.  She is indicated for venogram possible intervention.  Findings: Similar to previous venogram she has occluded left common external iliac veins.  On the right side she has a patent femoral, common femoral, external iliac and common iliac veins and the IVC is patent although there is some disease at the level of the confluence.  I was unable to cross the occluded common and external iliac veins on the left side.  The right common iliac vein measures 13 mm  Patient will need consideration of further bypass surgery versus endovascular intervention versus hybrid approach.   Procedure:  The patient was identified in the holding area and taken to room 8.  The patient was then placed prone on the table and prepped and draped in the usual sterile fashion.  A time out was called.  Ultrasound was used to evaluate the left small saphenous vein and this was traced to the popliteal vein.  This was cannulated with micropuncture needle followed by wire sheath with direct ultrasound visualization and an image was saved the permanent record.  I placed  a 5 [place] sheath.  I then used a Glidewire advantage to get to the level of the common femoral vein.  Venogram of the left lower extremity was performed.  I then attempted to cross the occluded extremity vein but could not.  I used the back of the wire to start track and then I was able to track medially but could not ever regain a lumen.  I did perform venography but there was perforation there.  Unsatisfied with this I turned my attention to the right lower extremity.  I used ultrasound to cannulate the small saphenous vein.  It was noted to be patent images saved the permanent record.  I placed a micropuncture sheath.  I then began using a wire but the patient had discomfort.  I try to perform venography but it appeared to be in a small branch.  With this I removed the sheath and held pressure for 5 minutes.  I then used ultrasound to identify the popliteal vein which is known to be patent and compressible.  I cannulated this with micropuncture needle followed the wire sheath.  I began an image was saved the permanent record.  I was able to place a wire freely into the IVC and perform right lower extremity venography.  We then exchanged for an 8 French sheath and perform IVUS from the popliteal all the way to the IVC with the above findings.  I did measure the right common iliac vein as a reference guide.  I then attempted to go up and over the bifurcation after administering 10,000 units of heparin.  I used angled catheters to attempt  to get retrograde through the left common iliac vein but could not get any wires to pass.  I then placed an intravascular ultrasound to the level of the confluence.  I attempted from the left side to cross towards the IVUS but could not.  At this time I elected to abandon the procedure.  Catheters and wires removed sheaths were pulled and pressure held for 10 minutes until hemostasis was adequate obtained.  She tolerated this procedure without immediate complication.  Contrast:  70 cc   Bani Gianfrancesco C. Donzetta Matters, MD [facility] [place] Office: [phone] Pager: [phone]

## 2019-03-09 NOTE — Progress Notes (Addendum)
ANTICOAGULATION CONSULT NOTE - Initial Consult  Pharmacy Consult for heparin Indication: VTE treatment  Patient Measurements: Height: 5\' 10"  (177.8 cm) Weight: 290 lb (131.5 kg) IBW/kg (Calculated) : 68.5 Heparin Dosing Weight: 99 kg  Vital Signs: Temp: 98.3 F (36.8 C) (05/05 1229) Temp Source: Oral (05/05 1229) BP: 132/84 (05/05 1229) Pulse Rate: 82 (05/05 1229)  Labs: Recent Labs    03/09/19 0938 03/09/19 0943  HGB 11.6*  --   HCT 34.0*  --   CREATININE  --  1.30*     Medical History: Past Medical History:  Diagnosis Date  . Anxiety   . Bipolar disorder ([place])   . Chronic kidney disease    Stage 3 kidney disease;dx by Dr. Sinda Du.   . Depression   . GERD (gastroesophageal reflux disease)   . High cholesterol   . History of DVT (deep vein thrombosis)    left leg  . Hypertension    states under control with meds., has been on med. x 2 years  . Hypothyroidism   . Obsessive-compulsive disorder   . Restless leg syndrome   . Trigger thumb of left hand 01/2018  . Trigger thumb of right hand     Assessment: 53 yo female admitted for L common iliac vein occlusion. She had a previous femoral to femoral bypass > but this failed. Also has an IVC. Now s/p another vascular procedure performed today. Planning to resume heparin 8 hours after the sheath was removed. Patient was taking Xarelto prior to admission, I spoke with her, the last time she took this was Sunday evening 5/3. Sheath removed at 1155. SCr 1.3, hgb 11.6 today, no plts .   Goal of Therapy:  Heparin level 0.3-0.7 units/ml Monitor platelets by anticoagulation protocol: Yes    Plan:  -Heparin infusion at 1400 units/hr, begin at 2000. Hold initial bolus. -Daily HL, CBC, aPTT -First level with AM labs   Harvel Quale 03/09/2019,12:50 PM

## 2019-03-09 NOTE — Progress Notes (Signed)
Pt arrived to 4E-09 via bed from cath lab. Pt given CHG bath. Tele applied, CCMD notified. VSS. Pt oriented to room and call bell. Call bell within reach. Will continue to monitor.  Amanda Cockayne, RN

## 2019-03-09 NOTE — H&P (Signed)
   History and Physical Update  The patient was interviewed and re-examined.  The patient's previous History and Physical has been reviewed and is unchanged from recent office visit. Plan for left leg venogram today in pv lab.  Brandon C. Donzetta Matters, MD [facility] [place] [place] Office: [phone] Pager: [phone]   03/09/2019, 9:33 AM

## 2019-03-10 DIAGNOSIS — I82422 Acute embolism and thrombosis of left iliac vein: Secondary | ICD-10-CM | POA: Diagnosis not present

## 2019-03-10 LAB — BASIC METABOLIC PANEL
Anion gap: 12 (ref 5–15)
BUN: 36 mg/dL — ABNORMAL HIGH (ref 6–20)
CO2: 28 mmol/L (ref 22–32)
Calcium: 9.1 mg/dL (ref 8.9–10.3)
Chloride: 101 mmol/L (ref 98–111)
Creatinine, Ser: 1.57 mg/dL — ABNORMAL HIGH (ref 0.44–1.00)
GFR calc Af Amer: 43 mL/min — ABNORMAL LOW (ref >60)
GFR calc non Af Amer: 38 mL/min — ABNORMAL LOW (ref >60)
Glucose, Bld: 133 mg/dL — ABNORMAL HIGH (ref 70–99)
Potassium: 3.6 mmol/L (ref 3.5–5.1)
Sodium: 141 mmol/L (ref 135–145)

## 2019-03-10 LAB — HEPARIN LEVEL (UNFRACTIONATED): Heparin Unfractionated: 0.4 IU/mL (ref 0.30–0.70)

## 2019-03-10 LAB — CBC
HCT: 30.5 % — ABNORMAL LOW (ref 36.0–46.0)
Hemoglobin: 9.2 g/dL — ABNORMAL LOW (ref 12.0–15.0)
MCH: 26.8 pg (ref 26.0–34.0)
MCHC: 30.2 g/dL (ref 30.0–36.0)
MCV: 88.9 fL (ref 80.0–100.0)
Platelets: 158 10*3/uL (ref 150–400)
RBC: 3.43 MIL/uL — ABNORMAL LOW (ref 3.87–5.11)
RDW: 15.9 % — ABNORMAL HIGH (ref 11.5–15.5)
WBC: 8.4 10*3/uL (ref 4.0–10.5)
nRBC: 0 % (ref 0.0–0.2)

## 2019-03-10 LAB — APTT: aPTT: 43 seconds — ABNORMAL HIGH (ref 24–36)

## 2019-03-10 MED ORDER — RIVAROXABAN 20 MG PO TABS
20.0000 mg | ORAL_TABLET | Freq: Every day | ORAL | Status: DC
  Administered 2019-03-10: 11:00:00 20 mg via ORAL
  Filled 2019-03-10: qty 1

## 2019-03-10 NOTE — Progress Notes (Addendum)
  Progress Note    03/10/2019 7:34 AM 1 Day Post-Op  Subjective:  No complaints  Afebrile   Vitals:   03/09/19 2000 03/10/19 0447  BP: (!) 118/58 (!) 103/51  Pulse: 84 89  Resp: 15 17  Temp: 97.9 F (36.6 C) 98.3 F (36.8 C)  SpO2: 95% (!) 89%    Physical Exam: General:  No distress Lungs:  Non labored Incisions:  Looks ok Extremities:  Left leg swelling   CBC    Component Value Date/Time   WBC 8.4 03/10/2019 0312   RBC 3.43 (L) 03/10/2019 0312   HGB 9.2 (L) 03/10/2019 0312   HCT 30.5 (L) 03/10/2019 0312   PLT 158 03/10/2019 0312   MCV 88.9 03/10/2019 0312   MCH 26.8 03/10/2019 0312   MCHC 30.2 03/10/2019 0312   RDW 15.9 (H) 03/10/2019 0312    BMET    Component Value Date/Time   NA 141 03/10/2019 0312   K 3.6 03/10/2019 0312   CL 101 03/10/2019 0312   CO2 28 03/10/2019 0312   GLUCOSE 133 (H) 03/10/2019 0312   BUN 36 (H) 03/10/2019 0312   CREATININE 1.57 (H) 03/10/2019 0312   CALCIUM 9.1 03/10/2019 0312   GFRNONAA 38 (L) 03/10/2019 0312   GFRAA 43 (L) 03/10/2019 0312    INR    Component Value Date/Time   INR 0.95 11/17/2018 1319     Intake/Output Summary (Last 24 hours) at 03/10/2019 0734 Last data filed at 03/10/2019 0300 Gross per 24 hour  Intake 899.85 ml  Output -  Net 899.85 ml     Assessment:  53 y.o. female is s/p:  1.  Ultrasound-guided cannulation left small saphenous vein 2.  Left lower extremity venogram 3.  Ultrasound-guided cannulation right small saphenous vein and right popliteal vein 4.  Right lower extremity venogram and IVC venogram 5.  Intravascular ultrasound of IVC, right common iliac and external iliac veins and common femoral and femoral veins 6.  Moderate sedation with fentanyl and Versed for 78 minutes  1 Day Post-Op   Plan: -pt doing well this morning.  Still with left leg swelling -femoral femoral vein bypass has occluded.  -Dr. Donzetta Matters to discuss with partners next step -DVT prophylaxis:  Continue heparin gtt  -? Compression.  Says she has compression at home, but they are too small. Will d/w MD.   Leontine Locket, PA-C [facility] [phone] 03/10/2019 7:34 AM  I agree with the above.  I have seen and evaluated the patient.  She will be brought back within the month for another attempt at treating her iliac vein occlusion.  Annamarie Major

## 2019-03-10 NOTE — Discharge Instructions (Signed)
° °  [facility] ° °Discharge Instructions ° °Lower Extremity Angiogram; Angioplasty/Stenting ° °Please refer to the following instructions for your post-procedure care. Your surgeon or physician assistant will discuss any changes with you. ° °Activity ° °Avoid lifting more than 8 pounds (1 gallons of milk) for 72 hours (3 days) after your procedure. You may walk as much as you can tolerate. It's OK to drive after 72 hours. ° °Bathing/Showering ° °You may shower the day after your procedure. If you have a bandage, you may remove it at 24- 48 hours. Clean your incision site with mild soap and water. Pat the area dry with a clean towel. ° °Diet ° °Resume your pre-procedure diet. There are no special food restrictions following this procedure. All patients with peripheral vascular disease should follow a low fat/low cholesterol diet. In order to heal from your surgery, it is CRITICAL to get adequate nutrition. Your body requires vitamins, minerals, and protein. Vegetables are the best source of vitamins and minerals. Vegetables also provide the perfect balance of protein. Processed food has little nutritional value, so try to avoid this. ° °Medications ° °Resume taking all of your medications unless your doctor tells you not to. If your incision is causing pain, you may take over-the-counter pain relievers such as acetaminophen (Tylenol) ° °Follow Up ° °Follow up will be arranged at the time of your procedure. You may have an office visit scheduled or may be scheduled for surgery. Ask your surgeon if you have any questions. ° °Please call us immediately for any of the following conditions: °•Severe or worsening pain your legs or feet at rest or with walking. °•Increased pain, redness, drainage at your groin puncture site. °•Fever of 101 degrees or higher. °•If you have any mild or slow bleeding from your puncture site: lie down, apply firm constant pressure over the area with a piece of  gauze or a clean wash cloth for 30 minutes- no peeking!, call 911 right away if you are still bleeding after 30 minutes, or if the bleeding is heavy and unmanageable. ° °Reduce your risk factors of vascular disease: ° °Stop smoking. If you would like help call [facility] at 1-800-QUIT-NOW (1-800-784-8669) or [place] at 336-586-4000. °Manage your cholesterol °Maintain a desired weight °Control your diabetes °Keep your blood pressure down ° °If you have any questions, please call the office at [phone] ° °

## 2019-03-10 NOTE — Discharge Summary (Signed)
Discharge Summary    Jane [name] Jul 14, 1966 53 y.o. female  [id number]  Admission Date: 03/09/2019  Discharge Date: 03/10/2019  Physician: Thomes Lolling*  Admission Diagnosis: DVT (deep venous thrombosis) ([place]) [I82.409]   HPI:   This is a 53 y.o. 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  Given her persistent swelling we will repeat venogram in a prone position.  She can take Xarelto the day prior.  We will get this done ASAP.  Hospital Course:  The patient was admitted to the hospital and taken to the operating room on 03/09/2019 and underwent: 1.  Ultrasound-guided cannulation left small saphenous vein 2.  Left lower extremity venogram 3.  Ultrasound-guided cannulation right small saphenous vein and right popliteal vein 4.  Right lower extremity venogram and IVC venogram 5.  Intravascular ultrasound of IVC, right common iliac and external iliac veins and common femoral and femoral veins 6.  Moderate sedation with fentanyl and Versed for 78 minutes    The pt tolerated the procedure well and was transported to the PACU in good condition.   Findings: Similar to previous venogram she has occluded left common external iliac veins.  On the right side she has a patent femoral, common femoral, external iliac and common iliac veins and the IVC is patent although there is some disease at the level of the confluence.  I was unable to cross the occluded common and external iliac veins on the left side.  The right common iliac vein measures 13 mm  Patient will need consideration of further bypass surgery versus endovascular intervention versus hybrid approach.  Dr. Donzetta Matters to determine next step.  Pt is measured for compression socks.  She is discharged home and office will contact her to arrange next step.   The remainder of the hospital course consisted of increasing  mobilization and increasing intake of solids without difficulty.  CBC    Component Value Date/Time   WBC 8.4 03/10/2019 0312   RBC 3.43 (L) 03/10/2019 0312   HGB 9.2 (L) 03/10/2019 0312   HCT 30.5 (L) 03/10/2019 0312   PLT 158 03/10/2019 0312   MCV 88.9 03/10/2019 0312   MCH 26.8 03/10/2019 0312   MCHC 30.2 03/10/2019 0312   RDW 15.9 (H) 03/10/2019 0312    BMET    Component Value Date/Time   NA 141 03/10/2019 0312   K 3.6 03/10/2019 0312   CL 101 03/10/2019 0312   CO2 28 03/10/2019 0312   GLUCOSE 133 (H) 03/10/2019 0312   BUN 36 (H) 03/10/2019 0312   CREATININE 1.57 (H) 03/10/2019 0312   CALCIUM 9.1 03/10/2019 0312   GFRNONAA 38 (L) 03/10/2019 0312   GFRAA 43 (L) 03/10/2019 0312      Discharge Diagnosis:  DVT (deep venous thrombosis) ([facility]) [I82.409]  Secondary Diagnosis: Patient Active Problem List   Diagnosis Date Noted   Iliac vein stenosis, 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  Sinda Du.    Depression    GERD (gastroesophageal reflux disease)    High cholesterol    History of DVT (deep vein thrombosis)    left leg   Hypertension    states under control with meds., has been on med. x 2 years   Hypothyroidism    Obsessive-compulsive disorder    Restless leg syndrome    Trigger thumb of left hand 01/2018   Trigger thumb of right hand      Allergies as of 03/10/2019      Reactions   Aripiprazole Other (See Comments)   BECOMES  VIOLENT   Seroquel [quetiapine Fumarate]  Other (See Comments)   BECOMES VIOLENT   Chlorpromazine Other (See Comments)   SEVERE ANXIETY   Gabapentin Other (See Comments)   NIGHTMARES      Medication List    TAKE these medications   ALPRAZolam 1 MG tablet Commonly known as:  Xanax Take 1 tablet (1 mg total) by mouth 2 (two) times daily as needed for anxiety.   amphetamine-dextroamphetamine 30 MG tablet Commonly known as:  Adderall Take 1 tablet by mouth 2 (two) times daily. Fill after 10/27/18   aspirin EC 81 MG tablet Take 81 mg by mouth daily.   atorvastatin 20 MG tablet Commonly known as:  LIPITOR Take 20 mg by mouth daily.   cetirizine 10 MG tablet Commonly known as:  ZYRTEC Take 10 mg by mouth daily.   cycloSPORINE 0.05 % ophthalmic emulsion Commonly known as:  RESTASIS Place 1 drop into both eyes 2 (two) times daily.   diclofenac sodium 1 % Gel Commonly known as:  VOLTAREN Apply 2-4 g topically 2 (two) times daily as needed (for foot pain.).   DULoxetine 60 MG capsule Commonly known as:  Cymbalta Take 1 capsule (60 mg total) by mouth 2 (two) times daily.   Fish Oil 1000 MG Caps Take 1,000 mg by mouth daily at 2 PM.   glipiZIDE 5 MG 24 hr tablet Commonly known as:  GLUCOTROL XL Take 5 mg by mouth daily with breakfast.   hydrochlorothiazide 25 MG tablet Commonly known as:  HYDRODIURIL Take 25 mg by mouth daily.   HYDROcodone-acetaminophen 10-325 MG tablet Commonly known as:  NORCO Take 1 tablet by mouth every 6 (six) hours as needed (for pain.).   levothyroxine 137 MCG tablet Commonly known as:  SYNTHROID Take 137 mcg by mouth daily before breakfast.   linaclotide 145 MCG Caps capsule Commonly known as:  LINZESS Take 145 mcg by mouth daily.   Magnesium 250 MG Tabs Take 250 mg by mouth daily.   Melatonin 10 MG Tbdp Take 10 mg by mouth at bedtime as needed (for sleep).   multivitamin with minerals Tabs tablet Take 1 tablet by mouth daily. One-A-Day Women's Multivitamin   pantoprazole  40 MG tablet Commonly known as:  PROTONIX Take 40 mg by mouth daily.   Pazeo 0.7 % Soln Generic drug:  Olopatadine HCl Place 1 drop into both eyes daily.   potassium chloride 10 MEQ tablet Commonly known as:  K-DUR Take 10 mEq by mouth 3 (three) times daily.   pregabalin 100 MG capsule Commonly known as:  LYRICA Take 100 mg by mouth 3 (three) times daily.   REFRESH OP Place 1 drop into both eyes 3 (three) times daily.   rivaroxaban 20 MG Tabs tablet Commonly known as:  Xarelto Take 1 tablet (20 mg total) by mouth daily with supper.   rOPINIRole 2 MG tablet Commonly  known as:  REQUIP Take 2 mg by mouth 2 (two) times daily.   triamcinolone 0.1 % paste Commonly known as:  KENALOG Use as directed 1 application in the mouth or throat 4 (four) times daily as needed (mouth ulcers).   vitamin C 500 MG tablet Commonly known as:  ASCORBIC ACID Take 500 mg by mouth at bedtime.   Vitamin D3 125 MCG (5000 UT) Caps Take 5,000 Units by mouth at bedtime.       Prescriptions given: none  Instructions:   [facility]  Discharge Instructions  Lower Extremity Angiogram; Angioplasty/Stenting  Please refer to the following instructions for your post-procedure care. Your surgeon or physician assistant will discuss any changes with you.  Activity  Avoid lifting more than 8 pounds (1 gallons of milk) for 72 hours (3 days) after your procedure. You may walk as much as you can tolerate. It's OK to drive after 72 hours.  Bathing/Showering  You may shower the day after your procedure. If you have a bandage, you may remove it at 24- 48 hours. Clean your incision site with mild soap and water. Pat the area dry with a clean towel.  Diet  Resume your pre-procedure diet. There are no special food restrictions following this procedure. All patients with peripheral vascular disease should follow a low fat/low cholesterol diet. In order to heal from your  surgery, it is CRITICAL to get adequate nutrition. Your body requires vitamins, minerals, and protein. Vegetables are the best source of vitamins and minerals. Vegetables also provide the perfect balance of protein. Processed food has little nutritional value, so try to avoid this.  Medications  Resume taking all of your medications unless your doctor tells you not to. If your incision is causing pain, you may take over-the-counter pain relievers such as acetaminophen (Tylenol)  Follow Up  Follow up will be arranged at the time of your procedure. You may have an office visit scheduled or may be scheduled for surgery. Ask your surgeon if you have any questions.  Please call us immediately for any of the following conditions: Severe or worsening pain your legs or feet at rest or with walking. Increased pain, redness, drainage at your groin puncture site. Fever of 101 degrees or higher. If you have any mild or slow bleeding from your puncture site: lie down, apply firm constant pressure over the area with a piece of gauze or a clean wash cloth for 30 minutes- no peeking!, call 911 right away if you are still bleeding after 30 minutes, or if the bleeding is heavy and unmanageable.  Reduce your risk factors of vascular disease:   Stop smoking. If you would like help call [facility] at 1-800-QUIT-NOW 707-460-8055) or [place] at [phone].  Manage your cholesterol  Maintain a desired weight  Control your diabetes  Keep your blood pressure down   If you have any questions, please call the office at [phone]  Wear compression socks   Disposition: home  Patient's condition: is Good  Follow up: 1. Dr. Donzetta Matters will arrange procedure   Leontine Locket, PA-C [facility] [phone] 03/10/2019  7:42 AM

## 2019-03-10 NOTE — Progress Notes (Signed)
ANTICOAGULATION CONSULT NOTE - Follow Up Consult  Pharmacy Consult for heparin Indication: VTE  Labs: Recent Labs    03/09/19 0938 03/09/19 0943 03/09/19 1512 03/10/19 0312  HGB 11.6*  --   --  9.2*  HCT 34.0*  --   --  30.5*  PLT  --   --   --  158  APTT  --   --  36 43*  HEPARINUNFRC  --   --   --  0.40  CREATININE  --  1.30*  --   --     Assessment: 52yo female subtherapeutic on heparin with initial dosing s/p vascular procedure; no gtt issues or signs of bleeding per RN.  Goal of Therapy:  aPTT 66-102 seconds   Plan:  Will increase heparin gtt by 3 units/kgABW/hr to 1700 units/hr and check PTT in 6 hours.    Wynona Neat, PharmD, BCPS  03/10/2019,4:11 AM

## 2019-03-10 NOTE — Progress Notes (Signed)
Discharge AVS meds take and those due reviewed with pt. Follow up appointments and when to call MD reviewed. All questions and concerns addressed. No further questions at this time. D/c IV and TELE, CCMD notified. D/C home per orders.  

## 2019-03-10 NOTE — Progress Notes (Signed)
ANTICOAGULATION CONSULT NOTE  Pharmacy Consult for heparin Indication: VTE treatment  Patient Measurements: Height: 5\' 10"  (177.8 cm) Weight: 290 lb (131.5 kg) IBW/kg (Calculated) : 68.5 Heparin Dosing Weight: 99 kg  Vital Signs: Temp: 98.3 F (36.8 C) (05/06 0447) Temp Source: Oral (05/06 0447) BP: 103/51 (05/06 0447) Pulse Rate: 89 (05/06 0447)  Labs: Recent Labs    03/09/19 0938 03/09/19 0943 03/09/19 1512 03/10/19 0312  HGB 11.6*  --   --  9.2*  HCT 34.0*  --   --  30.5*  PLT  --   --   --  158  APTT  --   --  36 43*  HEPARINUNFRC  --   --   --  0.40  CREATININE  --  1.30*  --  1.57*     Medical History: Past Medical History:  Diagnosis Date  . Anxiety   . Bipolar disorder ([place])   . Chronic kidney disease    Stage 3 kidney disease;dx by Dr. Sinda Du.   . Depression   . GERD (gastroesophageal reflux disease)   . High cholesterol   . History of DVT (deep vein thrombosis)    left leg  . Hypertension    states under control with meds., has been on med. x 2 years  . Hypothyroidism   . Obsessive-compulsive disorder   . Restless leg syndrome   . Trigger thumb of left hand 01/2018  . Trigger thumb of right hand     Assessment: 53 yo female admitted for L common iliac vein occlusion. She had a previous femoral to femoral bypass > but this failed. Also has an IVC. Now s/p another vascular procedure performed today. Planning to resume heparin 8 hours after the sheath was removed. Patient was taking Xarelto prior to admission, I spoke with her, the last time she took this was Sunday evening 5/3.   Planning to transition from heparin to Xarelto   Goal of Therapy:  Heparin level 0.3-0.7 units/ml Monitor platelets by anticoagulation protocol: Yes    Plan:  -Discontinue heparin, administer Xarelto at the same time when heparin is stopped -Xarelto 20 mg po daily   Harvel Quale 03/10/2019,9:02 AM

## 2019-03-12 ENCOUNTER — Other Ambulatory Visit: Payer: Self-pay | Admitting: *Deleted

## 2019-03-16 ENCOUNTER — Encounter (HOSPITAL_COMMUNITY): Payer: Self-pay | Admitting: Vascular Surgery

## 2019-03-22 ENCOUNTER — Telehealth: Payer: Self-pay

## 2019-03-22 NOTE — Telephone Encounter (Signed)
Pt called and said taht she is retaining water really bad. She said that her legs are so swollen that they are leaking fluid and it is hard for her to walk.   Called pt and she said that she is having severe swelling in both of her legs and feet as well as her hands. She said that she is also not urinating as frequently as she should either. She said that she is only dribbling when she goes though she is staying hydrated. Feels very full in her bladder.  Pt denies any fever, heat to her legs or redness.   Advised her to call her PCP given the swelling is both upper and lower extremeties and she is having difficulty with urination as there may be other health related issues going on.   York Cerise, CMA

## 2019-03-23 ENCOUNTER — Ambulatory Visit (INDEPENDENT_AMBULATORY_CARE_PROVIDER_SITE_OTHER): Payer: Medicaid Other | Admitting: Psychiatry

## 2019-03-23 ENCOUNTER — Other Ambulatory Visit: Payer: Self-pay

## 2019-03-23 ENCOUNTER — Encounter (HOSPITAL_COMMUNITY): Payer: Self-pay | Admitting: Psychiatry

## 2019-03-23 ENCOUNTER — Ambulatory Visit: Payer: Medicaid Other

## 2019-03-23 DIAGNOSIS — F3130 Bipolar disorder, current episode depressed, mild or moderate severity, unspecified: Secondary | ICD-10-CM | POA: Diagnosis not present

## 2019-03-23 MED ORDER — ALPRAZOLAM 1 MG PO TABS: 1.0000 mg | ORAL_TABLET | Freq: Two times a day (BID) | ORAL | 2 refills | Status: DC | PRN

## 2019-03-23 MED ORDER — DULOXETINE HCL 60 MG PO CPEP: 60.0000 mg | ORAL_CAPSULE | Freq: Two times a day (BID) | ORAL | 2 refills | Status: DC

## 2019-03-23 NOTE — Progress Notes (Signed)
Virtual Visit via Video Note  I connected with Jane [name] on 03/23/19 at 10:40 AM EDT by a video enabled telemedicine application and verified that I am speaking with the correct person using two identifiers.   I discussed the limitations of evaluation and management by telemedicine and the availability of in person appointments. The patient expressed understanding and agreed to proceed.      I discussed the assessment and treatment plan with the patient. The patient was provided an opportunity to ask questions and all were answered. The patient agreed with the plan and demonstrated an understanding of the instructions.   The patient was advised to call back or seek an in-person evaluation if the symptoms worsen or if the condition fails to improve as anticipated.  I provided 15 minutes of non-face-to-face time during this encounter.   [name], MD  [facility] MD/PA/NP OP Progress Note  03/23/2019 11:00 AM Jane [name]  MRN:  [id number]  Chief Complaint:  Chief Complaint    Depression; Anxiety; Follow-up     HPI: This patient is a 53year-old divorced white female who lives alone in [place]. She is on disability due to bipolar disorder.  The patient states that she became very depressed when her mother died in 2000/02/01 of a brain aneurysm. She started getting. Sad but eventually became manic and develop mood swings and agitated behavior. She's been on numerous medications over the years. In Feb 01, 1988 she also had postpartum depression. For many years she was on lithium but it caused a bad taste in her mouth and her doctor was worried it might be starting to affect her renal function so was discontinued. She also thinks that it stopped working after a number of years  The patient returns for follow-up after 3 months.  She is seen by telemedicine due to the coronavirus pandemic.  The patient states that she is doing okay.  She has had more ultrasound studies and is going to have to have more  vascular surgery specifically endovascular iliac cannulization next month.  She is a bit worried about it but her left leg is been swelling so much that there really is not any choice.  She states that overall her mood is fairly stable.  She is staying in touch with her family and sees her grandchildren occasionally but not very often.  She is a bit concerned about who will help care for her after surgery but her primary physician is trying to get home health aide to come in to help her.  She denies severe depression or suicidal ideation.  She is still able to do minor things around the house and to get her own groceries at least for now. Visit Diagnosis:    ICD-10-CM   1. Bipolar I disorder, most recent episode depressed ([place]) F31.30     Past Psychiatric History: Long-term outpatient treatment  Past Medical History:  Past Medical History:  Diagnosis Date  . Anxiety   . Bipolar disorder ([place])   . Chronic kidney disease    Stage 3 kidney disease;dx by Dr. Sinda Du.   . Depression   . GERD (gastroesophageal reflux disease)   . High cholesterol   . History of DVT (deep vein thrombosis)    left leg  . Hypertension    states under control with meds., has been on med. x 2 years  . Hypothyroidism   . Obsessive-compulsive disorder   . Restless leg syndrome   . Trigger thumb of left hand Jan 31, 2018  .  Trigger thumb of right hand     Past Surgical History:  Procedure Laterality Date  . ABDOMINAL HYSTERECTOMY  06/2016   complete  . AV FISTULA PLACEMENT Left 11/24/2018   Procedure: ARTERIOVENOUS (AV) FISTULA CREATION LEFT SFA TO LEFT FEMORAL VEIN;  Surgeon: Waynetta Sandy, MD;  Location: [facility];  Service: Vascular;  Laterality: Left;  . CHOLECYSTECTOMY    . FEMORAL-FEMORAL BYPASS GRAFT Left 11/24/2018   Procedure: BYPASS GRAFT FEMORAL-FEMORAL VENOUS LEFT TO RIGHT PALMA PROCEDURE USING CRYOVEIN;  Surgeon: Waynetta Sandy, MD;  Location: [facility];  Service: Vascular;   Laterality: Left;  . LOWER EXTREMITY VENOGRAPHY N/A 08/17/2018   Procedure: LOWER EXTREMITY VENOGRAPHY - Central Venogram;  Surgeon: Waynetta Sandy, MD;  Location: [place] CV LAB;  Service: Cardiovascular;  Laterality: N/A;  . LOWER EXTREMITY VENOGRAPHY Bilateral 03/09/2019   Procedure: LOWER EXTREMITY VENOGRAPHY;  Surgeon: Waynetta Sandy, MD;  Location: [place] [facility];  Service: Cardiovascular;  Laterality: Bilateral;  . LUMBAR FUSION  11/21/2000   L5-S1  . LUMBAR SPINE SURGERY     x 2 others  . TRIGGER FINGER RELEASE Right 12/01/2017   Procedure: RELEASE TRIGGER FINGER/A-1 PULLEY RIGHT THUMB;  Surgeon: Leanora Cover, MD;  Location: [facility];  Service: Orthopedics;  Laterality: Right;  . TRIGGER FINGER RELEASE Left 01/26/2018   Procedure: LEFT TRIGGER THUMB RELEASE;  Surgeon: Leanora Cover, MD;  Location: [facility];  Service: Orthopedics;  Laterality: Left;    Family Psychiatric History: See below  Family History:  Family History  Problem Relation Age of Onset  . Heart disease Mother   . Hyperlipidemia Mother   . Hypertension Mother   . Bipolar disorder Mother   . Diabetes Father   . Heart disease Father   . Hyperlipidemia Father   . Hypertension Father   . Drug abuse Daughter   . ADD / ADHD Daughter   . Drug abuse Daughter   . Anxiety disorder Daughter   . Bipolar disorder Daughter   . Hypertension Sister   . Hypertension Brother   . Hyperlipidemia Brother   . Heart disease Brother   . Bipolar disorder Maternal Aunt   . Suicidality Maternal Aunt     Social History:  Social History   Socioeconomic History  . Marital status: Divorced    Spouse name: Not on file  . Number of children: Not on file  . Years of education: Not on file  . Highest education level: Not on file  Occupational History  . Not on file  Social Needs  . Financial resource strain: Not on file  . Food insecurity:    Worry: Not on file     Inability: Not on file  . Transportation needs:    Medical: Not on file    Non-medical: Not on file  Tobacco Use  . Smoking status: Never Smoker  . Smokeless tobacco: Never Used  Substance and Sexual Activity  . Alcohol use: No  . Drug use: No  . Sexual activity: Yes    Partners: Male    Birth control/protection: Surgical  Lifestyle  . Physical activity:    Days per week: Not on file    Minutes per session: Not on file  . Stress: Not on file  Relationships  . Social connections:    Talks on phone: Not on file    Gets together: Not on file    Attends religious service: Not on file    Active member  of club or organization: Not on file    Attends meetings of clubs or organizations: Not on file    Relationship status: Not on file  Other Topics Concern  . Not on file  Social History Narrative  . Not on file    Allergies:  Allergies  Allergen Reactions  . Aripiprazole Other (See Comments)    BECOMES  VIOLENT   . Seroquel [Quetiapine Fumarate] Other (See Comments)    BECOMES VIOLENT  . Chlorpromazine Other (See Comments)    SEVERE ANXIETY   . Gabapentin Other (See Comments)    NIGHTMARES     Metabolic Disorder Labs: No results found for: HGBA1C, MPG No results found for: PROLACTIN No results found for: CHOL, TRIG, HDL, CHOLHDL, VLDL, LDLCALC No results found for: TSH  Therapeutic Level Labs: No results found for: LITHIUM Lab Results  Component Value Date   VALPROATE 61.2 03/23/2014   No components found for:  CBMZ  Current Medications: Current Outpatient Medications  Medication Sig Dispense Refill  . ALPRAZolam (XANAX) 1 MG tablet Take 1 tablet (1 mg total) by mouth 2 (two) times daily as needed for anxiety. 60 tablet 2  . amphetamine-dextroamphetamine (ADDERALL) 30 MG tablet Take 1 tablet by mouth 2 (two) times daily. Fill after 10/27/18 60 tablet 0  . aspirin EC 81 MG tablet Take 81 mg by mouth daily.    Marland Kitchen atorvastatin (LIPITOR) 20 MG tablet Take 20 mg  by mouth daily.    . cetirizine (ZYRTEC) 10 MG tablet Take 10 mg by mouth daily.    . Cholecalciferol (VITAMIN D3) 125 MCG (5000 UT) CAPS Take 5,000 Units by mouth at bedtime.    . cycloSPORINE (RESTASIS) 0.05 % ophthalmic emulsion Place 1 drop into both eyes 2 (two) times daily.    . diclofenac sodium (VOLTAREN) 1 % GEL Apply 2-4 g topically 2 (two) times daily as needed (for foot pain.).    Marland Kitchen DULoxetine (CYMBALTA) 60 MG capsule Take 1 capsule (60 mg total) by mouth 2 (two) times daily. 60 capsule 2  . glipiZIDE (GLUCOTROL XL) 5 MG 24 hr tablet Take 5 mg by mouth daily with breakfast.    . hydrochlorothiazide (HYDRODIURIL) 25 MG tablet Take 25 mg by mouth daily.    Marland Kitchen HYDROcodone-acetaminophen (NORCO) 10-325 MG tablet Take 1 tablet by mouth every 6 (six) hours as needed (for pain.).    Marland Kitchen levothyroxine (SYNTHROID, LEVOTHROID) 137 MCG tablet Take 137 mcg by mouth daily before breakfast.    . linaclotide (LINZESS) 145 MCG CAPS capsule Take 145 mcg by mouth daily.     . Magnesium 250 MG TABS Take 250 mg by mouth daily.    . Melatonin 10 MG TBDP Take 10 mg by mouth at bedtime as needed (for sleep).    . Multiple Vitamin (MULTIVITAMIN WITH MINERALS) TABS tablet Take 1 tablet by mouth daily. One-A-Day Women's Multivitamin    . Omega-3 Fatty Acids (FISH OIL) 1000 MG CAPS Take 1,000 mg by mouth daily at 2 PM.    . pantoprazole (PROTONIX) 40 MG tablet Take 40 mg by mouth daily.    Marland Kitchen PAZEO 0.7 % SOLN Place 1 drop into both eyes daily.     . Polyvinyl Alcohol-Povidone (REFRESH OP) Place 1 drop into both eyes 3 (three) times daily.     . potassium chloride (K-DUR) 10 MEQ tablet Take 10 mEq by mouth 3 (three) times daily.    . pregabalin (LYRICA) 100 MG capsule Take 100 mg by mouth 3 (  three) times daily.    . rivaroxaban (XARELTO) 20 MG TABS tablet Take 1 tablet (20 mg total) by mouth daily with supper. 30 tablet 6  . rOPINIRole (REQUIP) 2 MG tablet Take 2 mg by mouth 2 (two) times daily.    Marland Kitchen triamcinolone  (KENALOG) 0.1 % paste Use as directed 1 application in the mouth or throat 4 (four) times daily as needed (mouth ulcers).    . vitamin C (ASCORBIC ACID) 500 MG tablet Take 500 mg by mouth at bedtime.     No current facility-administered medications for this visit.      Musculoskeletal: Strength & Muscle Tone: decreased Gait & Station: unsteady Patient leans: N/A  Psychiatric Specialty Exam: Review of Systems  Musculoskeletal: Positive for back pain and joint pain.  Neurological: Positive for tingling and focal weakness.  All other systems reviewed and are negative.   There were no vitals taken for this visit.There is no height or weight on file to calculate BMI.  General Appearance: Casual and Fairly Groomed  Eye Contact:  Good  Speech:  Clear and Coherent  Volume:  Normal  Mood:  Euthymic  Affect:  Appropriate and Congruent  Thought Process:  Goal Directed  Orientation:  Full (Time, Place, and Person)  Thought Content: Rumination   Suicidal Thoughts:  No  Homicidal Thoughts:  No  Memory:  Immediate;   Good Recent;   Good Remote;   Good  Judgement:  Good  Insight:  Fair  Psychomotor Activity:  Decreased  Concentration:  Concentration: Good and Attention Span: Good  Recall:  Good  Fund of Knowledge: Good  Language: Good  Akathisia:  No  Handed:  Right  AIMS (if indicated): not done  Assets:  Communication Skills Desire for Improvement Resilience Social Support Talents/Skills  ADL's:  Intact  Cognition: WNL  Sleep:  Fair   Screenings: PHQ2-9     Nutrition from 09/22/2014 in [facility]  PHQ-2 Total Score  4  PHQ-9 Total Score  15       Assessment and Plan:  This patient is a 53 year old female with a history of presumed bipolar disorder but generally seems to have primarily symptoms of depression and anxiety.  She also has a history of ADD.  She has a lot of medical issues going on but for now she seems stable on Xanax 1 mg  twice daily as needed for anxiety and Cymbalta 60 mg twice daily for depression.  She is prescribed Adderall 30 mg twice daily for ADD by her primary physician.  She will return to see me in 2 months or call sooner if needed  [name], MD 03/23/2019, 11:00 AM

## 2019-03-24 NOTE — Progress Notes (Signed)
Anesthesia Chart Review:  Case:  [id number] Date/Time:  03/30/19 0715   Procedures:      ENDOVASCULAR ILIAC RECANNULATION POSSIBLE OPEN REPAIR (Left )     RECANNULATION OF ILIAC ARTERY (Left )   Anesthesia type:  General   Pre-op diagnosis:  venacaval occlusion   Location:  [facility] OR ROOM 16 / [facility]   Surgeon:  Waynetta Sandy, MD      DISCUSSION: Patient is a 53 year old female scheduled for the above procedure. 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 She was started on Xarelto. At her 03/05/19 visit, she had developed recurrent significant LLE edema and venogram ordered showing failed bypass with occluded left common and external iliac veins. Dr. Donzetta Matters was unable to cross the occlusion and repeat bypass versus endovascular intervention versus hybrid approach recommended.   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 BMI is consistent with morbid obesity. OSA screening score was 7. - She has chronic LLE edema, but noted on RLE as well with decreased UOP on 03/22/19. She notified Dr. Claretha Cooper office who referred her to her PCP Sinda Du, MD. She reported that she was seen by Dr. [name] and started on Lasix daily as needed. She used daily 03/23/19-03/25/19 and noticed improvement in edema (R > L).   Preoperative labs showed Cr 1.39, down from 1.57 on 03/10/19. K 3.2. H/H 9.4/31.6, up from 03/10/19. PLT 184K. PT/PTT WNL. [name] 6.3%. Current HGB stable over the last several weeks,  but HGB 14.3 on 11/17/18 prior to original bypass and dropped to 8.3 post-op. T&S done, but will place order to prepare 1 Unit PRBC to have available perioperatively if needed.   Per VVS: Patient to continue taking Xarelto and not stop per Dr. Donzetta Matters. 03/26/19 preoperative COVID test is in process. Surgeon and anesthesia team to evaluate on the day of surgery.    VS: BP 127/64   Pulse 88   Temp 37 C   Resp 18   Ht 5\' 10"  (1.778 m)   Wt 134.1 kg   SpO2 96%   BMI 42.41 kg/m     PROVIDERS: Sinda Du, MD is PCP   LABS: Preoperative labs noted.  (all labs ordered are listed, but only abnormal results are displayed)  Labs Reviewed  GLUCOSE, CAPILLARY - Abnormal; Notable for the following components:      Result Value   Glucose-Capillary 121 (*)    All other components within normal limits  CBC - Abnormal; Notable for the following components:   RBC 3.56 (*)    Hemoglobin 9.4 (*)    HCT 31.6 (*)    MCHC 29.7 (*)    RDW 17.0 (*)    All other components within normal limits  COMPREHENSIVE METABOLIC PANEL - Abnormal; Notable for the following components:   Potassium 3.2 (*)    Glucose, Bld 106 (*)    BUN 28 (*)    Creatinine, Ser 1.39 (*)    GFR calc non Af Amer 43 (*)    GFR calc Af Amer 50 (*)  All other components within normal limits  URINALYSIS, ROUTINE W REFLEX MICROSCOPIC - Abnormal; Notable for the following components:   Protein, ur 30 (*)    All other components within normal limits  HEMOGLOBIN [name] - Abnormal; Notable for the following components:   Hgb [name] MFr Bld 6.3 (*)    All other components within normal limits  SURGICAL PCR SCREEN  APTT  PROTIME-INR  TYPE AND SCREEN    EKG: 03/09/19: Sinus rhythm with Premature atrial complexes Prolonged QT (QT/QTc 446/498 ms) Abnormal ECG No significant change since last tracing Confirmed by Mertie Moores ([id number]) on 03/10/2019 4:44:29 PM   CV: BLE and IVC venogram 03/09/19: Findings: Similar to previous  venogram she has occluded left common external iliac veins.  On the right side she has a patent femoral, common femoral, external iliac and common iliac veins and the IVC is patent although there is some disease at the level of the confluence.  I was unable to cross the occluded common and external iliac veins on the left side.  The right common iliac vein measures 13 mm. - Patient will need consideration of further bypass surgery versus endovascular intervention versus hybrid approach.   Past Medical History:  Diagnosis Date  . Anxiety   . Bipolar disorder ([place])   . Chronic kidney disease    Stage 3 kidney disease;dx by Dr. Sinda Du.   . Constipation   . Depression   . Diabetes mellitus without complication ([place])   . Dyspnea    with exertion  . GERD (gastroesophageal reflux disease)   . Headache    migraines  . High cholesterol   . History of DVT (deep vein thrombosis)    left leg  . Hypertension    states under control with meds., has been on med. x 2 years  . Hypothyroidism   . Obsessive-compulsive disorder   . Peripheral vascular disease ([place])   . Restless leg syndrome   . Trigger thumb of left hand 01/2018  . Trigger thumb of right hand     Past Surgical History:  Procedure Laterality Date  . ABDOMINAL HYSTERECTOMY  06/2016   complete  . AV FISTULA PLACEMENT Left 11/24/2018   Procedure: ARTERIOVENOUS (AV) FISTULA CREATION LEFT SFA TO LEFT FEMORAL VEIN;  Surgeon: Waynetta Sandy, MD;  Location: [facility];  Service: Vascular;  Laterality: Left;  . CHOLECYSTECTOMY    . FEMORAL-FEMORAL BYPASS GRAFT Left 11/24/2018   Procedure: BYPASS GRAFT FEMORAL-FEMORAL VENOUS LEFT TO RIGHT PALMA PROCEDURE USING CRYOVEIN;  Surgeon: Waynetta Sandy, MD;  Location: [facility];  Service: Vascular;  Laterality: Left;  . LOWER EXTREMITY VENOGRAPHY N/A 08/17/2018   Procedure: LOWER EXTREMITY VENOGRAPHY - Central Venogram;  Surgeon: Waynetta Sandy, MD;  Location: [facility];  Service: Cardiovascular;  Laterality: N/A;  . LOWER EXTREMITY VENOGRAPHY Bilateral 03/09/2019   Procedure: LOWER EXTREMITY VENOGRAPHY;  Surgeon: Waynetta Sandy, MD;  Location: [name];  Service: Cardiovascular;  Laterality: Bilateral;  . LUMBAR FUSION  11/21/2000   L5-S1  . LUMBAR SPINE SURGERY     x 2 others  . TRIGGER FINGER RELEASE Right 12/01/2017   Procedure: RELEASE TRIGGER FINGER/A-1 PULLEY RIGHT THUMB;  Surgeon: Leanora Cover, MD;  Location: [facility];  Service: Orthopedics;  Laterality: Right;  . TRIGGER FINGER RELEASE Left 01/26/2018   Procedure: LEFT TRIGGER THUMB RELEASE;  Surgeon: Leanora Cover, MD;  Location: [facility];  Service: Orthopedics;  Laterality: Left;  MEDICATIONS: . furosemide (LASIX) 40 MG tablet  . ALPRAZolam (XANAX) 1 MG tablet  . amphetamine-dextroamphetamine (ADDERALL) 30 MG tablet  . aspirin EC 81 MG tablet  . atorvastatin (LIPITOR) 20 MG tablet  . cetirizine (ZYRTEC) 10 MG tablet  . Cholecalciferol (VITAMIN D3) 125 MCG (5000 UT) CAPS  . cycloSPORINE (RESTASIS) 0.05 % ophthalmic emulsion  . DULoxetine (CYMBALTA) 60 MG capsule  . glipiZIDE (GLUCOTROL XL) 5 MG 24 hr tablet  . hydrochlorothiazide (HYDRODIURIL) 25 MG tablet  . HYDROcodone-acetaminophen (NORCO) 10-325 MG tablet  . levothyroxine (SYNTHROID, LEVOTHROID) 137 MCG tablet  . linaclotide (LINZESS) 145 MCG CAPS capsule  . Magnesium 250 MG TABS  . Melatonin 10 MG TBDP  . Multiple Vitamin (MULTIVITAMIN WITH MINERALS) TABS tablet  . Omega-3 Fatty Acids (FISH OIL) 1000 MG CAPS  . pantoprazole (PROTONIX) 40 MG tablet  . PAZEO 0.7 % SOLN  . Polyvinyl Alcohol-Povidone (REFRESH OP)  . potassium chloride (K-DUR) 10 MEQ tablet  . pregabalin (LYRICA) 100 MG capsule  . rivaroxaban (XARELTO) 20 MG TABS tablet  . rOPINIRole (REQUIP) 2 MG tablet  . triamcinolone (KENALOG) 0.1 % paste  . vitamin C (ASCORBIC ACID) 500 MG tablet   No current  facility-administered medications for this encounter.     Myra Gianotti, PA-C Surgical Short Stay/Anesthesiology [facility] Phone [phone] [facility] Phone [phone] 03/26/2019 3:17 PM

## 2019-03-26 ENCOUNTER — Other Ambulatory Visit (HOSPITAL_COMMUNITY)
Admission: RE | Admit: 2019-03-26 | Discharge: 2019-03-26 | Disposition: A | Discharge status: Home / Self Care | Payer: Medicaid Other | Source: Ambulatory Visit | Attending: Vascular Surgery | Admitting: Vascular Surgery

## 2019-03-26 ENCOUNTER — Encounter (HOSPITAL_COMMUNITY): Payer: Self-pay

## 2019-03-26 ENCOUNTER — Ambulatory Visit: Payer: Self-pay | Admitting: Vascular Surgery

## 2019-03-26 ENCOUNTER — Encounter (HOSPITAL_COMMUNITY)
Admission: RE | Admit: 2019-03-26 | Discharge: 2019-03-26 | Disposition: A | Discharge status: Home / Self Care | Payer: Medicaid Other | Source: Ambulatory Visit | Attending: Vascular Surgery | Admitting: Vascular Surgery

## 2019-03-26 ENCOUNTER — Other Ambulatory Visit: Payer: Self-pay

## 2019-03-26 DIAGNOSIS — F429 Obsessive-compulsive disorder, unspecified: Secondary | ICD-10-CM | POA: Diagnosis not present

## 2019-03-26 DIAGNOSIS — R0600 Dyspnea, unspecified: Secondary | ICD-10-CM | POA: Insufficient documentation

## 2019-03-26 DIAGNOSIS — F319 Bipolar disorder, unspecified: Secondary | ICD-10-CM | POA: Insufficient documentation

## 2019-03-26 DIAGNOSIS — E1122 Type 2 diabetes mellitus with diabetic chronic kidney disease: Secondary | ICD-10-CM | POA: Diagnosis not present

## 2019-03-26 DIAGNOSIS — Z7901 Long term (current) use of anticoagulants: Secondary | ICD-10-CM | POA: Diagnosis not present

## 2019-03-26 DIAGNOSIS — F419 Anxiety disorder, unspecified: Secondary | ICD-10-CM | POA: Insufficient documentation

## 2019-03-26 DIAGNOSIS — Z1159 Encounter for screening for other viral diseases: Secondary | ICD-10-CM | POA: Insufficient documentation

## 2019-03-26 DIAGNOSIS — E7800 Pure hypercholesterolemia, unspecified: Secondary | ICD-10-CM | POA: Diagnosis not present

## 2019-03-26 DIAGNOSIS — E1151 Type 2 diabetes mellitus with diabetic peripheral angiopathy without gangrene: Secondary | ICD-10-CM | POA: Insufficient documentation

## 2019-03-26 DIAGNOSIS — Z01812 Encounter for preprocedural laboratory examination: Secondary | ICD-10-CM | POA: Diagnosis present

## 2019-03-26 DIAGNOSIS — K219 Gastro-esophageal reflux disease without esophagitis: Secondary | ICD-10-CM | POA: Insufficient documentation

## 2019-03-26 DIAGNOSIS — E039 Hypothyroidism, unspecified: Secondary | ICD-10-CM | POA: Insufficient documentation

## 2019-03-26 DIAGNOSIS — Z86718 Personal history of other venous thrombosis and embolism: Secondary | ICD-10-CM | POA: Diagnosis not present

## 2019-03-26 DIAGNOSIS — G2581 Restless legs syndrome: Secondary | ICD-10-CM | POA: Diagnosis not present

## 2019-03-26 DIAGNOSIS — I129 Hypertensive chronic kidney disease with stage 1 through stage 4 chronic kidney disease, or unspecified chronic kidney disease: Secondary | ICD-10-CM | POA: Insufficient documentation

## 2019-03-26 DIAGNOSIS — Z7984 Long term (current) use of oral hypoglycemic drugs: Secondary | ICD-10-CM | POA: Insufficient documentation

## 2019-03-26 DIAGNOSIS — Z7982 Long term (current) use of aspirin: Secondary | ICD-10-CM | POA: Diagnosis not present

## 2019-03-26 DIAGNOSIS — Z79899 Other long term (current) drug therapy: Secondary | ICD-10-CM | POA: Insufficient documentation

## 2019-03-26 DIAGNOSIS — N189 Chronic kidney disease, unspecified: Secondary | ICD-10-CM | POA: Insufficient documentation

## 2019-03-26 HISTORY — DX: Peripheral vascular disease, unspecified: I73.9

## 2019-03-26 HISTORY — DX: Headache, unspecified: R51.9

## 2019-03-26 HISTORY — DX: Type 2 diabetes mellitus without complications: E11.9

## 2019-03-26 HISTORY — DX: Dyspnea, unspecified: R06.00

## 2019-03-26 HISTORY — DX: Constipation, unspecified: K59.00

## 2019-03-26 LAB — COMPREHENSIVE METABOLIC PANEL
ALT: 22 U/L (ref 0–44)
AST: 18 U/L (ref 15–41)
Albumin: 3.7 g/dL (ref 3.5–5.0)
Alkaline Phosphatase: 125 U/L (ref 38–126)
Anion gap: 12 (ref 5–15)
BUN: 28 mg/dL — ABNORMAL HIGH (ref 6–20)
CO2: 28 mmol/L (ref 22–32)
Calcium: 9.2 mg/dL (ref 8.9–10.3)
Chloride: 101 mmol/L (ref 98–111)
Creatinine, Ser: 1.39 mg/dL — ABNORMAL HIGH (ref 0.44–1.00)
GFR calc Af Amer: 50 mL/min — ABNORMAL LOW (ref >60)
GFR calc non Af Amer: 43 mL/min — ABNORMAL LOW (ref >60)
Glucose, Bld: 106 mg/dL — ABNORMAL HIGH (ref 70–99)
Potassium: 3.2 mmol/L — ABNORMAL LOW (ref 3.5–5.1)
Sodium: 141 mmol/L (ref 135–145)
Total Bilirubin: 0.6 mg/dL (ref 0.3–1.2)
Total Protein: 7.5 g/dL (ref 6.5–8.1)

## 2019-03-26 LAB — URINALYSIS, ROUTINE W REFLEX MICROSCOPIC
Bacteria, UA: NONE SEEN
Bilirubin Urine: NEGATIVE
Glucose, UA: NEGATIVE mg/dL
Hgb urine dipstick: NEGATIVE
Ketones, ur: NEGATIVE mg/dL
Leukocytes,Ua: NEGATIVE
Nitrite: NEGATIVE
Protein, ur: 30 mg/dL — AB
Specific Gravity, Urine: 1.015 (ref 1.005–1.030)
pH: 5 (ref 5.0–8.0)

## 2019-03-26 LAB — HEMOGLOBIN A1C
Hgb A1c MFr Bld: 6.3 % — ABNORMAL HIGH (ref 4.8–5.6)
Mean Plasma Glucose: 134.11 mg/dL

## 2019-03-26 LAB — SURGICAL PCR SCREEN
MRSA, PCR: NEGATIVE
Staphylococcus aureus: NEGATIVE

## 2019-03-26 LAB — CBC
HCT: 31.6 % — ABNORMAL LOW (ref 36.0–46.0)
Hemoglobin: 9.4 g/dL — ABNORMAL LOW (ref 12.0–15.0)
MCH: 26.4 pg (ref 26.0–34.0)
MCHC: 29.7 g/dL — ABNORMAL LOW (ref 30.0–36.0)
MCV: 88.8 fL (ref 80.0–100.0)
Platelets: 184 10*3/uL (ref 150–400)
RBC: 3.56 MIL/uL — ABNORMAL LOW (ref 3.87–5.11)
RDW: 17 % — ABNORMAL HIGH (ref 11.5–15.5)
WBC: 7.5 10*3/uL (ref 4.0–10.5)
nRBC: 0 % (ref 0.0–0.2)

## 2019-03-26 LAB — PROTIME-INR
INR: 1.2 (ref 0.8–1.2)
Prothrombin Time: 15.1 seconds (ref 11.4–15.2)

## 2019-03-26 LAB — GLUCOSE, CAPILLARY: Glucose-Capillary: 121 mg/dL — ABNORMAL HIGH (ref 70–99)

## 2019-03-26 LAB — APTT: aPTT: 34 seconds (ref 24–36)

## 2019-03-26 NOTE — Progress Notes (Signed)
   03/26/19 1322  OBSTRUCTIVE SLEEP APNEA  Have you ever been diagnosed with sleep apnea through a sleep study? No  Do you snore loudly (loud enough to be heard through closed doors)?  1  Do you often feel tired, fatigued, or sleepy during the daytime (such as falling asleep during driving or talking to someone)? 1  Has anyone observed you stop breathing during your sleep? 1  Do you have, or are you being treated for high blood pressure? 1  BMI more than 35 kg/m2? 1  Age > 50 (1-yes) 1  Neck circumference greater than:Female 16 inches or larger, Female 17inches or larger? 1  Female Gender (Yes=1) 0  Obstructive Sleep Apnea Score 7  Score 5 or greater  Results sent to PCP

## 2019-03-26 NOTE — Anesthesia Preprocedure Evaluation (Addendum)
Anesthesia Evaluation  Patient identified by MRN, date of birth, ID band Patient awake    Reviewed: Allergy & Precautions, NPO status , Patient's Chart, lab work & pertinent test results  History of Anesthesia Complications Negative for: history of anesthetic complications  Airway Mallampati: III  TM Distance: >3 FB Neck ROM: Full    Dental  (+) Dental Advisory Given, Teeth Intact   Pulmonary shortness of breath and with exertion,    breath sounds clear to auscultation       Cardiovascular hypertension, Pt. on medications + Peripheral Vascular Disease and + DVT   Rhythm:Regular Rate:Normal   May-Thurner Syndrome    Neuro/Psych  Headaches, PSYCHIATRIC DISORDERS Anxiety Depression Bipolar Disorder  OCD  RLS     GI/Hepatic Neg liver ROS, GERD  Medicated and Controlled,  Endo/Other  diabetes, Type 2, Oral Hypoglycemic AgentsHypothyroidism Morbid obesity  Renal/GU CRFRenal disease     Musculoskeletal negative musculoskeletal ROS (+)   Abdominal (+) + obese,   Peds  Hematology  (+) anemia ,   Anesthesia Other Findings   Reproductive/Obstetrics                           Anesthesia Physical Anesthesia Plan  ASA: III  Anesthesia Plan: General   Post-op Pain Management:    Induction: Intravenous  PONV Risk Score and Plan: 3 and Treatment may vary due to age or medical condition, Ondansetron, Dexamethasone and Midazolam  Airway Management Planned: Oral ETT and Video Laryngoscope Planned  Additional Equipment: Arterial line  Intra-op Plan:   Post-operative Plan: Extubation in OR  Informed Consent: I have reviewed the patients History and Physical, chart, labs and discussed the procedure including the risks, benefits and alternatives for the proposed anesthesia with the patient or authorized representative who has indicated his/her understanding and acceptance.     Dental advisory  given  Plan Discussed with: CRNA and Anesthesiologist  Anesthesia Plan Comments: ( )      Anesthesia Quick Evaluation

## 2019-03-26 NOTE — Progress Notes (Addendum)
PCP - Dr. Sinda Du Cardiologist - n/a  Chest x-ray - n/a EKG - 03/09/19 Stress Test - n/a ECHO - n/a Cardiac Cath - n/a  Sleep Study - had one scheduled but it got postponed, + Stop Bang Assessment, sent to Dr. Wolfgang Phoenix CPAP -   Fasting Blood Sugar - ? Checks Blood Sugar __0___ times a day [name] drawn today. Pt said last one was last year in the fall.   Blood Thinner Instructions: Pt instructed to continue Xarelto per Dr. Donzetta Matters Aspirin Instructions:Pt instructed to continue her Aspirin per Dr. Donzetta Matters  Anesthesia review: yes  Patient denies shortness of breath, fever, cough and chest pain at PAT appointment   Patient verbalized understanding of instructions that were given to them at the PAT appointment. Patient was also instructed that they will need to review over the PAT instructions again at home before surgery.  Patient has a Covid 19 testing appt later today. Pt was not aware that she had to quarantine after having the test. She broke down in tears because she had planned an outing with her grandchildren tomorrow. She voiced understanding as to why she needed to quarantine.    Coronavirus Screening  Have you experienced the following symptoms:  Cough NO Fever (>100.57F)  NO Runny nose NO Sore throat NO Difficulty breathing/shortness of breath  NO  Have you or a family member traveled in the last 14 days and where? NO   If the patient indicates "YES" to the above questions, their PAT will be rescheduled to limit the exposure to others and, the surgeon will be notified. THE PATIENT WILL NEED TO BE ASYMPTOMATIC FOR 14 DAYS.   If the patient is not experiencing any of these symptoms, the PAT nurse will instruct them to NOT bring anyone with them to their appointment since they may have these symptoms or traveled as well.   Patient reminded that hospital visitation restrictions are in effect and the importance of the restrictions.

## 2019-03-26 NOTE — Pre-Procedure Instructions (Signed)
Jane [name]  03/26/2019    Your procedure is scheduled on Tuesday, Mar 30, 2019 at 7:30 AM.   Report to [facility] Entrance "A" Admitting Office at 5:30 AM.   Call this number if you have problems the morning of surgery: [phone]   Remember:  Do not eat or drink after midnight Monday, 03/29/19.  Take these medicines the morning of surgery with A SIP OF WATER: Aspirin, Cetirizine (Zyrtec), Duloxetine (Cymbalta), Levothyroxine (Synthroid), Pantoprazole (Protonix), Pregabalin (Lyrica), Hydrocodone - if needed, Alprazolam (Xanax) - if needed  Do not take Glipizide the morning of surgery.  Stop Multivitamins, Herbal medications (Melatonin) and Fish Oil as of today. Do not use NSAIDS (Ibuprofen, Aleve, etc) prior to surgery.   How to Manage Your Diabetes Before Surgery   Why is it important to control my blood sugar before and after surgery?   Improving blood sugar levels before and after surgery helps healing and can limit problems.  A way of improving blood sugar control is eating a healthy diet by:  - Eating less sugar and carbohydrates  - Increasing activity/exercise  - Talk with your doctor about reaching your blood sugar goals  High blood sugars (greater than 180 mg/dL) can raise your risk of infections and slow down your recovery so you will need to focus on controlling your diabetes during the weeks before surgery.  Make sure that the doctor who takes care of your diabetes knows about your planned surgery including the date and location.  How do I manage my blood sugars before surgery?   Check your blood sugar at least 4 times a day, 2 days before surgery to make sure that they are not too high or low.  Check your blood sugar the morning of your surgery when you wake up and every 2 hours until you get to the Short-Stay unit.  Treat a low blood sugar (less than 70 mg/dL) with 1/2 cup of clear juice (cranberry or apple), 4 glucose tablets, OR glucose  gel.  Recheck blood sugar in 15 minutes after treatment (to make sure it is greater than 70 mg/dL).  If blood sugar is not greater than 70 mg/dL on re-check, call [phone] for further instructions.   Report your blood sugar to the Short-Stay nurse when you get to Short-Stay.  References:  [facility], 2007 "How to Manage your Diabetes Before and After Surgery".    Do not wear jewelry, make-up or nail polish.  Do not wear lotions, powders, perfumes or deodorant.  Do not shave 48 hours prior to surgery.    Do not bring valuables to the hospital.  [facility] is not responsible for any belongings or valuables.  Contacts, dentures or bridgework may not be worn into surgery.  Leave your suitcase in the car.  After surgery it may be brought to your room.  For patients admitted to the hospital, discharge time will be determined by your treatment team.  [facility] - Preparing for Surgery  Before surgery, you can play an important role.  Because skin is not sterile, your skin needs to be as free of germs as possible.  You can reduce the number of germs on you skin by washing with CHG (chlorahexidine gluconate) soap before surgery.  CHG is an antiseptic cleaner which kills germs and bonds with the skin to continue killing germs even after washing.  Oral Hygiene is also important in reducing the risk of infection.  Remember to brush your teeth  with your regular toothpaste the morning of surgery.  Please DO NOT use if you have an allergy to CHG or antibacterial soaps.  If your skin becomes reddened/irritated stop using the CHG and inform your nurse when you arrive at Short Stay.  Do not shave (including legs and underarms) for at least 48 hours prior to the first CHG shower.  You may shave your face.  Please follow these instructions carefully:   1.  Shower with CHG Soap the night before surgery and the morning of Surgery.  2.  If you choose to wash your hair, wash  your hair first as usual with your normal shampoo.  3.  After you shampoo, rinse your hair and body thoroughly to remove the shampoo. 4.  Use CHG as you would any other liquid soap.  You can apply chg directly to the skin and wash gently with a      scrungie or washcloth.           5.  Apply the CHG Soap to your body ONLY FROM THE NECK DOWN.   Do not use on open wounds or open sores. Avoid contact with your eyes, ears, mouth and genitals (private parts).  Wash genitals (private parts) with your normal soap.  6.  Wash thoroughly, paying special attention to the area where your surgery will be performed.  7.  Thoroughly rinse your body with warm water from the neck down.  8.  DO NOT shower/wash with your normal soap after using and rinsing off the CHG Soap.  9.  Pat yourself dry with a clean towel.            10.  Wear clean pajamas.            11.  Place clean sheets on your bed the night of your first shower and do not sleep with pets.  Day of Surgery  Shower as above. Do not apply any lotions/deodorants the morning of surgery.   Please wear clean clothes to the hospital. Remember to brush your teeth with toothpaste.   Please read over the fact sheets that you were given.

## 2019-03-27 LAB — NOVEL CORONAVIRUS, NAA (HOSP ORDER, SEND-OUT TO REF LAB; TAT 18-24 HRS): SARS-CoV-2, NAA: NOT DETECTED

## 2019-03-30 ENCOUNTER — Encounter (HOSPITAL_COMMUNITY)
Admission: RE | Disposition: A | Discharge status: Home Health | Payer: Self-pay | Source: Home / Self Care | Attending: Vascular Surgery

## 2019-03-30 ENCOUNTER — Encounter (HOSPITAL_COMMUNITY): Payer: Self-pay | Admitting: *Deleted

## 2019-03-30 ENCOUNTER — Inpatient Hospital Stay (HOSPITAL_COMMUNITY)
Admission: RE | Admit: 2019-03-30 | Discharge: 2019-04-05 | DRG: 252 | Disposition: A | Discharge status: Home Health | Payer: Medicaid Other | Attending: Vascular Surgery | Admitting: Vascular Surgery

## 2019-03-30 ENCOUNTER — Other Ambulatory Visit: Payer: Self-pay

## 2019-03-30 ENCOUNTER — Other Ambulatory Visit (HOSPITAL_COMMUNITY): Payer: Self-pay | Admitting: *Deleted

## 2019-03-30 ENCOUNTER — Inpatient Hospital Stay (HOSPITAL_COMMUNITY): Payer: Medicaid Other | Admitting: Vascular Surgery

## 2019-03-30 ENCOUNTER — Inpatient Hospital Stay (HOSPITAL_COMMUNITY): Payer: Medicaid Other

## 2019-03-30 DIAGNOSIS — Z9889 Other specified postprocedural states: Secondary | ICD-10-CM

## 2019-03-30 DIAGNOSIS — E11649 Type 2 diabetes mellitus with hypoglycemia without coma: Secondary | ICD-10-CM | POA: Diagnosis not present

## 2019-03-30 DIAGNOSIS — Y92234 Operating room of hospital as the place of occurrence of the external cause: Secondary | ICD-10-CM | POA: Diagnosis not present

## 2019-03-30 DIAGNOSIS — D62 Acute posthemorrhagic anemia: Secondary | ICD-10-CM | POA: Diagnosis not present

## 2019-03-30 DIAGNOSIS — G2581 Restless legs syndrome: Secondary | ICD-10-CM | POA: Diagnosis present

## 2019-03-30 DIAGNOSIS — Z981 Arthrodesis status: Secondary | ICD-10-CM

## 2019-03-30 DIAGNOSIS — J9589 Other postprocedural complications and disorders of respiratory system, not elsewhere classified: Secondary | ICD-10-CM

## 2019-03-30 DIAGNOSIS — Z79890 Hormone replacement therapy (postmenopausal): Secondary | ICD-10-CM

## 2019-03-30 DIAGNOSIS — I129 Hypertensive chronic kidney disease with stage 1 through stage 4 chronic kidney disease, or unspecified chronic kidney disease: Secondary | ICD-10-CM | POA: Diagnosis present

## 2019-03-30 DIAGNOSIS — Y831 Surgical operation with implant of artificial internal device as the cause of abnormal reaction of the patient, or of later complication, without mention of misadventure at the time of the procedure: Secondary | ICD-10-CM | POA: Diagnosis not present

## 2019-03-30 DIAGNOSIS — I878 Other specified disorders of veins: Secondary | ICD-10-CM | POA: Diagnosis present

## 2019-03-30 DIAGNOSIS — E1122 Type 2 diabetes mellitus with diabetic chronic kidney disease: Secondary | ICD-10-CM | POA: Diagnosis present

## 2019-03-30 DIAGNOSIS — I82529 Chronic embolism and thrombosis of unspecified iliac vein: Secondary | ICD-10-CM | POA: Diagnosis present

## 2019-03-30 DIAGNOSIS — E039 Hypothyroidism, unspecified: Secondary | ICD-10-CM | POA: Diagnosis present

## 2019-03-30 DIAGNOSIS — I871 Compression of vein: Secondary | ICD-10-CM

## 2019-03-30 DIAGNOSIS — J9690 Respiratory failure, unspecified, unspecified whether with hypoxia or hypercapnia: Secondary | ICD-10-CM | POA: Diagnosis not present

## 2019-03-30 DIAGNOSIS — I82522 Chronic embolism and thrombosis of left iliac vein: Secondary | ICD-10-CM | POA: Diagnosis present

## 2019-03-30 DIAGNOSIS — E861 Hypovolemia: Secondary | ICD-10-CM | POA: Diagnosis not present

## 2019-03-30 DIAGNOSIS — T82898A Other specified complication of vascular prosthetic devices, implants and grafts, initial encounter: Secondary | ICD-10-CM | POA: Diagnosis present

## 2019-03-30 DIAGNOSIS — Z90710 Acquired absence of both cervix and uterus: Secondary | ICD-10-CM

## 2019-03-30 DIAGNOSIS — F319 Bipolar disorder, unspecified: Secondary | ICD-10-CM | POA: Diagnosis present

## 2019-03-30 DIAGNOSIS — I82412 Acute embolism and thrombosis of left femoral vein: Secondary | ICD-10-CM | POA: Diagnosis present

## 2019-03-30 DIAGNOSIS — E7800 Pure hypercholesterolemia, unspecified: Secondary | ICD-10-CM | POA: Diagnosis present

## 2019-03-30 DIAGNOSIS — Z6841 Body Mass Index (BMI) 40.0 and over, adult: Secondary | ICD-10-CM | POA: Diagnosis not present

## 2019-03-30 DIAGNOSIS — Z79899 Other long term (current) drug therapy: Secondary | ICD-10-CM

## 2019-03-30 DIAGNOSIS — S75192A Other specified injury of femoral vein at hip and thigh level, left leg, initial encounter: Secondary | ICD-10-CM | POA: Diagnosis not present

## 2019-03-30 DIAGNOSIS — Y718 Miscellaneous cardiovascular devices associated with adverse incidents, not elsewhere classified: Secondary | ICD-10-CM | POA: Diagnosis not present

## 2019-03-30 DIAGNOSIS — E1151 Type 2 diabetes mellitus with diabetic peripheral angiopathy without gangrene: Secondary | ICD-10-CM | POA: Diagnosis present

## 2019-03-30 DIAGNOSIS — E669 Obesity, unspecified: Secondary | ICD-10-CM | POA: Diagnosis present

## 2019-03-30 DIAGNOSIS — R578 Other shock: Secondary | ICD-10-CM

## 2019-03-30 DIAGNOSIS — K589 Irritable bowel syndrome without diarrhea: Secondary | ICD-10-CM | POA: Diagnosis present

## 2019-03-30 DIAGNOSIS — F419 Anxiety disorder, unspecified: Secondary | ICD-10-CM | POA: Diagnosis present

## 2019-03-30 DIAGNOSIS — J95821 Acute postprocedural respiratory failure: Secondary | ICD-10-CM | POA: Diagnosis not present

## 2019-03-30 DIAGNOSIS — L8990 Pressure ulcer of unspecified site, unspecified stage: Secondary | ICD-10-CM

## 2019-03-30 DIAGNOSIS — L988 Other specified disorders of the skin and subcutaneous tissue: Secondary | ICD-10-CM | POA: Diagnosis not present

## 2019-03-30 DIAGNOSIS — G8929 Other chronic pain: Secondary | ICD-10-CM | POA: Diagnosis present

## 2019-03-30 DIAGNOSIS — N183 Chronic kidney disease, stage 3 (moderate): Secondary | ICD-10-CM | POA: Diagnosis present

## 2019-03-30 DIAGNOSIS — I82419 Acute embolism and thrombosis of unspecified femoral vein: Secondary | ICD-10-CM | POA: Diagnosis present

## 2019-03-30 DIAGNOSIS — K219 Gastro-esophageal reflux disease without esophagitis: Secondary | ICD-10-CM | POA: Diagnosis present

## 2019-03-30 DIAGNOSIS — T829XXA Unspecified complication of cardiac and vascular prosthetic device, implant and graft, initial encounter: Secondary | ICD-10-CM

## 2019-03-30 DIAGNOSIS — Z8349 Family history of other endocrine, nutritional and metabolic diseases: Secondary | ICD-10-CM

## 2019-03-30 DIAGNOSIS — S35515A Injury of left iliac vein, initial encounter: Secondary | ICD-10-CM | POA: Diagnosis not present

## 2019-03-30 DIAGNOSIS — Z95820 Peripheral vascular angioplasty status with implants and grafts: Secondary | ICD-10-CM

## 2019-03-30 DIAGNOSIS — Z833 Family history of diabetes mellitus: Secondary | ICD-10-CM

## 2019-03-30 DIAGNOSIS — Z8249 Family history of ischemic heart disease and other diseases of the circulatory system: Secondary | ICD-10-CM

## 2019-03-30 DIAGNOSIS — Z7984 Long term (current) use of oral hypoglycemic drugs: Secondary | ICD-10-CM

## 2019-03-30 DIAGNOSIS — Z7982 Long term (current) use of aspirin: Secondary | ICD-10-CM

## 2019-03-30 HISTORY — PX: ULTRASOUND GUIDANCE FOR VASCULAR ACCESS: SHX6516

## 2019-03-30 HISTORY — PX: PATCH ANGIOPLASTY: SHX6230

## 2019-03-30 HISTORY — PX: FEMORAL ARTERY EXPLORATION: SHX5160

## 2019-03-30 HISTORY — PX: INSERTION OF ILIAC STENT: SHX6256

## 2019-03-30 LAB — BASIC METABOLIC PANEL
Anion gap: 8 (ref 5–15)
BUN: 19 mg/dL (ref 6–20)
CO2: 24 mmol/L (ref 22–32)
Calcium: 8.1 mg/dL — ABNORMAL LOW (ref 8.9–10.3)
Chloride: 109 mmol/L (ref 98–111)
Creatinine, Ser: 1.26 mg/dL — ABNORMAL HIGH (ref 0.44–1.00)
GFR calc Af Amer: 57 mL/min — ABNORMAL LOW (ref >60)
GFR calc non Af Amer: 49 mL/min — ABNORMAL LOW (ref >60)
Glucose, Bld: 212 mg/dL — ABNORMAL HIGH (ref 70–99)
Potassium: 4.3 mmol/L (ref 3.5–5.1)
Sodium: 141 mmol/L (ref 135–145)

## 2019-03-30 LAB — CBC
HCT: 26.5 % — ABNORMAL LOW (ref 36.0–46.0)
HCT: 28.3 % — ABNORMAL LOW (ref 36.0–46.0)
Hemoglobin: 8.4 g/dL — ABNORMAL LOW (ref 12.0–15.0)
Hemoglobin: 9.2 g/dL — ABNORMAL LOW (ref 12.0–15.0)
MCH: 27.9 pg (ref 26.0–34.0)
MCH: 28.5 pg (ref 26.0–34.0)
MCHC: 31.7 g/dL (ref 30.0–36.0)
MCHC: 32.5 g/dL (ref 30.0–36.0)
MCV: 88 fL (ref 80.0–100.0)
MCV: 87.6 fL (ref 80.0–100.0)
Platelets: 113 10*3/uL — ABNORMAL LOW (ref 150–400)
Platelets: 126 10*3/uL — ABNORMAL LOW (ref 150–400)
RBC: 3.01 MIL/uL — ABNORMAL LOW (ref 3.87–5.11)
RBC: 3.23 MIL/uL — ABNORMAL LOW (ref 3.87–5.11)
RDW: 15.6 % — ABNORMAL HIGH (ref 11.5–15.5)
RDW: 15.7 % — ABNORMAL HIGH (ref 11.5–15.5)
WBC: 12.8 10*3/uL — ABNORMAL HIGH (ref 4.0–10.5)
WBC: 23.5 10*3/uL — ABNORMAL HIGH (ref 4.0–10.5)
nRBC: 0 % (ref 0.0–0.2)

## 2019-03-30 LAB — POCT I-STAT 4, (NA,K, GLUC, HGB,HCT)
Glucose, Bld: 110 mg/dL — ABNORMAL HIGH (ref 70–99)
Glucose, Bld: 207 mg/dL — ABNORMAL HIGH (ref 70–99)
HCT: 24 % — ABNORMAL LOW (ref 36.0–46.0)
HCT: 22 % — ABNORMAL LOW (ref 36.0–46.0)
Hemoglobin: 8.2 g/dL — ABNORMAL LOW (ref 12.0–15.0)
Hemoglobin: 7.5 g/dL — ABNORMAL LOW (ref 12.0–15.0)
Potassium: 3.8 mmol/L (ref 3.5–5.1)
Potassium: 4.2 mmol/L (ref 3.5–5.1)
Sodium: 140 mmol/L (ref 135–145)
Sodium: 141 mmol/L (ref 135–145)

## 2019-03-30 LAB — POCT I-STAT 7, (LYTES, BLD GAS, ICA,H+H)
Acid-base deficit: 1 mmol/L (ref 0.0–2.0)
Acid-base deficit: 1 mmol/L (ref 0.0–2.0)
Acid-base deficit: 2 mmol/L (ref 0.0–2.0)
Bicarbonate: 26.1 mmol/L (ref 20.0–28.0)
Bicarbonate: 25.1 mmol/L (ref 20.0–28.0)
Bicarbonate: 24 mmol/L (ref 20.0–28.0)
Calcium, Ion: 1.1 mmol/L — ABNORMAL LOW (ref 1.15–1.40)
Calcium, Ion: 1.18 mmol/L (ref 1.15–1.40)
Calcium, Ion: 1.19 mmol/L (ref 1.15–1.40)
HCT: 25 % — ABNORMAL LOW (ref 36.0–46.0)
HCT: 20 % — ABNORMAL LOW (ref 36.0–46.0)
HCT: 27 % — ABNORMAL LOW (ref 36.0–46.0)
Hemoglobin: 8.5 g/dL — ABNORMAL LOW (ref 12.0–15.0)
Hemoglobin: 6.8 g/dL — CL (ref 12.0–15.0)
Hemoglobin: 9.2 g/dL — ABNORMAL LOW (ref 12.0–15.0)
O2 Saturation: 97 %
O2 Saturation: 98 %
O2 Saturation: 96 %
Patient temperature: 36.4
Patient temperature: 98.6
Potassium: 4.2 mmol/L (ref 3.5–5.1)
Potassium: 4.2 mmol/L (ref 3.5–5.1)
Potassium: 4.3 mmol/L (ref 3.5–5.1)
Sodium: 142 mmol/L (ref 135–145)
Sodium: 141 mmol/L (ref 135–145)
Sodium: 141 mmol/L (ref 135–145)
TCO2: 28 mmol/L (ref 22–32)
TCO2: 27 mmol/L (ref 22–32)
TCO2: 25 mmol/L (ref 22–32)
pCO2 arterial: 54.9 mmHg — ABNORMAL HIGH (ref 32.0–48.0)
pCO2 arterial: 46.5 mmHg (ref 32.0–48.0)
pCO2 arterial: 44.6 mmHg (ref 32.0–48.0)
pH, Arterial: 7.282 — ABNORMAL LOW (ref 7.350–7.450)
pH, Arterial: 7.341 — ABNORMAL LOW (ref 7.350–7.450)
pH, Arterial: 7.339 — ABNORMAL LOW (ref 7.350–7.450)
pO2, Arterial: 103 mmHg (ref 83.0–108.0)
pO2, Arterial: 109 mmHg — ABNORMAL HIGH (ref 83.0–108.0)
pO2, Arterial: 84 mmHg (ref 83.0–108.0)

## 2019-03-30 LAB — PREPARE RBC (CROSSMATCH)

## 2019-03-30 LAB — GLUCOSE, CAPILLARY
Glucose-Capillary: 145 mg/dL — ABNORMAL HIGH (ref 70–99)
Glucose-Capillary: 169 mg/dL — ABNORMAL HIGH (ref 70–99)
Glucose-Capillary: 194 mg/dL — ABNORMAL HIGH (ref 70–99)
Glucose-Capillary: 95 mg/dL (ref 70–99)

## 2019-03-30 LAB — APTT
aPTT: 171 seconds (ref 24–36)
aPTT: 35 seconds (ref 24–36)

## 2019-03-30 LAB — PROTIME-INR
INR: 1.6 — ABNORMAL HIGH (ref 0.8–1.2)
INR: 1.3 — ABNORMAL HIGH (ref 0.8–1.2)
Prothrombin Time: 19.1 seconds — ABNORMAL HIGH (ref 11.4–15.2)
Prothrombin Time: 16.1 seconds — ABNORMAL HIGH (ref 11.4–15.2)

## 2019-03-30 LAB — MAGNESIUM: Magnesium: 1.7 mg/dL (ref 1.7–2.4)

## 2019-03-30 SURGERY — ANGIOPLASTY, USING PATCH GRAFT
Anesthesia: General | Site: Neck | Laterality: Right

## 2019-03-30 MED ORDER — LEVOTHYROXINE SODIUM 100 MCG/5ML IV SOLN
68.5000 ug | Freq: Every day | INTRAVENOUS | Status: DC
  Administered 2019-03-31: 68.5 ug via INTRAVENOUS
  Filled 2019-03-30 (×2): qty 5

## 2019-03-30 MED ORDER — SUCCINYLCHOLINE CHLORIDE 200 MG/10ML IV SOSY
PREFILLED_SYRINGE | INTRAVENOUS | Status: AC
  Filled 2019-03-30: qty 10

## 2019-03-30 MED ORDER — HEPARIN SODIUM (PORCINE) 1000 UNIT/ML IJ SOLN
INTRAMUSCULAR | Status: DC | PRN
  Administered 2019-03-30 (×2): 5000 [IU] via INTRAVENOUS

## 2019-03-30 MED ORDER — PHENYLEPHRINE 40 MCG/ML (10ML) SYRINGE FOR IV PUSH (FOR BLOOD PRESSURE SUPPORT)
PREFILLED_SYRINGE | INTRAVENOUS | Status: DC | PRN
  Administered 2019-03-30: 120 ug via INTRAVENOUS
  Administered 2019-03-30: 40 ug via INTRAVENOUS
  Administered 2019-03-30 (×3): 80 ug via INTRAVENOUS
  Administered 2019-03-30: 200 ug via INTRAVENOUS

## 2019-03-30 MED ORDER — ONDANSETRON HCL 4 MG/2ML IJ SOLN: 4.0000 mg | Freq: Once | INTRAMUSCULAR | Status: DC | PRN

## 2019-03-30 MED ORDER — CHLORHEXIDINE GLUCONATE CLOTH 2 % EX PADS: 6.0000 | MEDICATED_PAD | Freq: Once | CUTANEOUS | Status: DC

## 2019-03-30 MED ORDER — MIDAZOLAM HCL 5 MG/5ML IJ SOLN
INTRAMUSCULAR | Status: DC | PRN
  Administered 2019-03-30 (×3): 2 mg via INTRAVENOUS

## 2019-03-30 MED ORDER — INSULIN ASPART 100 UNIT/ML ~~LOC~~ SOLN
0.0000 [IU] | SUBCUTANEOUS | Status: DC
  Administered 2019-03-30 (×2): 3 [IU] via SUBCUTANEOUS
  Administered 2019-03-31 – 2019-04-01 (×4): 2 [IU] via SUBCUTANEOUS
  Administered 2019-04-01: 3 [IU] via SUBCUTANEOUS
  Administered 2019-04-01 – 2019-04-02 (×2): 2 [IU] via SUBCUTANEOUS

## 2019-03-30 MED ORDER — SUCCINYLCHOLINE CHLORIDE 200 MG/10ML IV SOSY
PREFILLED_SYRINGE | INTRAVENOUS | Status: DC | PRN
  Administered 2019-03-30: 120 mg via INTRAVENOUS

## 2019-03-30 MED ORDER — ATROPINE SULFATE 1 MG/10ML IJ SOSY
PREFILLED_SYRINGE | INTRAMUSCULAR | Status: AC
  Filled 2019-03-30: qty 10

## 2019-03-30 MED ORDER — CEFAZOLIN SODIUM-DEXTROSE 2-4 GM/100ML-% IV SOLN
2.0000 g | INTRAVENOUS | Status: AC
  Administered 2019-03-30 (×2): 2 g via INTRAVENOUS
  Filled 2019-03-30: qty 100

## 2019-03-30 MED ORDER — 0.9 % SODIUM CHLORIDE (POUR BTL) OPTIME
TOPICAL | Status: DC | PRN
  Administered 2019-03-30: 1000 mL

## 2019-03-30 MED ORDER — SODIUM CHLORIDE 0.9% IV SOLUTION: Freq: Once | INTRAVENOUS | Status: DC

## 2019-03-30 MED ORDER — FAMOTIDINE IN NACL 20-0.9 MG/50ML-% IV SOLN
20.0000 mg | Freq: Two times a day (BID) | INTRAVENOUS | Status: DC
  Administered 2019-03-30 – 2019-04-01 (×4): 20 mg via INTRAVENOUS
  Filled 2019-03-30 (×3): qty 50

## 2019-03-30 MED ORDER — HYDRALAZINE HCL 20 MG/ML IJ SOLN: 5.0000 mg | INTRAMUSCULAR | Status: DC | PRN

## 2019-03-30 MED ORDER — OXYCODONE HCL 5 MG/5ML PO SOLN: 5.0000 mg | Freq: Once | ORAL | Status: DC | PRN

## 2019-03-30 MED ORDER — DEXAMETHASONE SODIUM PHOSPHATE 10 MG/ML IJ SOLN
INTRAMUSCULAR | Status: AC
  Filled 2019-03-30: qty 1

## 2019-03-30 MED ORDER — DOCUSATE SODIUM 100 MG PO CAPS: 100.0000 mg | ORAL_CAPSULE | Freq: Every day | ORAL | Status: DC

## 2019-03-30 MED ORDER — OXYCODONE HCL 5 MG PO TABS: 5.0000 mg | ORAL_TABLET | Freq: Once | ORAL | Status: DC | PRN

## 2019-03-30 MED ORDER — DEXMEDETOMIDINE HCL IN NACL 200 MCG/50ML IV SOLN
INTRAVENOUS | Status: DC | PRN
  Administered 2019-03-30: .5 ug/kg/h via INTRAVENOUS

## 2019-03-30 MED ORDER — MIDAZOLAM HCL 2 MG/2ML IJ SOLN
2.0000 mg | INTRAMUSCULAR | Status: DC | PRN
  Administered 2019-03-30: 2 mg via INTRAVENOUS
  Filled 2019-03-30: qty 2

## 2019-03-30 MED ORDER — ROCURONIUM BROMIDE 10 MG/ML (PF) SYRINGE
PREFILLED_SYRINGE | INTRAVENOUS | Status: AC
  Filled 2019-03-30: qty 10

## 2019-03-30 MED ORDER — FENTANYL CITRATE (PF) 100 MCG/2ML IJ SOLN: 50.0000 ug | INTRAMUSCULAR | Status: DC | PRN

## 2019-03-30 MED ORDER — DEXMEDETOMIDINE HCL IN NACL 400 MCG/100ML IV SOLN
0.4000 ug/kg/h | INTRAVENOUS | Status: DC
  Administered 2019-03-30: 0.799 ug/kg/h via INTRAVENOUS
  Filled 2019-03-30: qty 100

## 2019-03-30 MED ORDER — CYCLOSPORINE 0.05 % OP EMUL: 1.0000 [drp] | Freq: Two times a day (BID) | OPHTHALMIC | Status: DC

## 2019-03-30 MED ORDER — PROPOFOL 10 MG/ML IV BOLUS
INTRAVENOUS | Status: DC | PRN
  Administered 2019-03-30: 50 mg via INTRAVENOUS
  Administered 2019-03-30: 150 mg via INTRAVENOUS

## 2019-03-30 MED ORDER — VASOPRESSIN 20 UNIT/ML IV SOLN
0.0300 [IU]/min | INTRAVENOUS | Status: DC
  Filled 2019-03-30: qty 2

## 2019-03-30 MED ORDER — CALCIUM CHLORIDE 10 % IV SOLN
INTRAVENOUS | Status: DC | PRN
  Administered 2019-03-30: 200 mg via INTRAVENOUS
  Administered 2019-03-30: 100 mg via INTRAVENOUS
  Administered 2019-03-30: 200 mg via INTRAVENOUS
  Administered 2019-03-30: 500 mg via INTRAVENOUS

## 2019-03-30 MED ORDER — FENTANYL CITRATE (PF) 250 MCG/5ML IJ SOLN
INTRAMUSCULAR | Status: AC
  Filled 2019-03-30: qty 5

## 2019-03-30 MED ORDER — DEXAMETHASONE SODIUM PHOSPHATE 10 MG/ML IJ SOLN
INTRAMUSCULAR | Status: DC | PRN
  Administered 2019-03-30: 10 mg via INTRAVENOUS

## 2019-03-30 MED ORDER — SODIUM CHLORIDE 0.9 % IV SOLN
INTRAVENOUS | Status: DC | PRN
  Administered 2019-03-30 (×2)

## 2019-03-30 MED ORDER — HEPARIN SODIUM (PORCINE) 1000 UNIT/ML IJ SOLN
INTRAMUSCULAR | Status: AC
  Filled 2019-03-30: qty 1

## 2019-03-30 MED ORDER — PHENYLEPHRINE HCL-NACL 20-0.9 MG/250ML-% IV SOLN
0.0000 ug/min | INTRAVENOUS | Status: DC
  Administered 2019-03-30: 100 ug/min via INTRAVENOUS
  Administered 2019-03-30 (×2): 60 ug/min via INTRAVENOUS
  Filled 2019-03-30 (×3): qty 250

## 2019-03-30 MED ORDER — POTASSIUM CHLORIDE CRYS ER 20 MEQ PO TBCR: 20.0000 meq | EXTENDED_RELEASE_TABLET | Freq: Every day | ORAL | Status: DC | PRN

## 2019-03-30 MED ORDER — CHLORHEXIDINE GLUCONATE CLOTH 2 % EX PADS: 6.0000 | MEDICATED_PAD | Freq: Every day | CUTANEOUS | Status: DC

## 2019-03-30 MED ORDER — PANTOPRAZOLE SODIUM 40 MG PO TBEC: 40.0000 mg | DELAYED_RELEASE_TABLET | Freq: Every day | ORAL | Status: DC

## 2019-03-30 MED ORDER — DEXMEDETOMIDINE HCL IN NACL 200 MCG/50ML IV SOLN: 0.0000 ug/kg/h | INTRAVENOUS | Status: DC

## 2019-03-30 MED ORDER — PHENYLEPHRINE 40 MCG/ML (10ML) SYRINGE FOR IV PUSH (FOR BLOOD PRESSURE SUPPORT)
PREFILLED_SYRINGE | INTRAVENOUS | Status: AC
  Filled 2019-03-30: qty 10

## 2019-03-30 MED ORDER — LIDOCAINE 2% (20 MG/ML) 5 ML SYRINGE
INTRAMUSCULAR | Status: DC | PRN
  Administered 2019-03-30: 60 mg via INTRAVENOUS

## 2019-03-30 MED ORDER — MIDAZOLAM HCL 2 MG/2ML IJ SOLN
INTRAMUSCULAR | Status: AC
  Filled 2019-03-30: qty 2

## 2019-03-30 MED ORDER — KCL IN DEXTROSE-NACL 20-5-0.45 MEQ/L-%-% IV SOLN: INTRAVENOUS | Status: DC

## 2019-03-30 MED ORDER — PROPOFOL 10 MG/ML IV BOLUS
INTRAVENOUS | Status: AC
  Filled 2019-03-30: qty 20

## 2019-03-30 MED ORDER — GUAIFENESIN-DM 100-10 MG/5ML PO SYRP: 15.0000 mL | ORAL_SOLUTION | ORAL | Status: DC | PRN

## 2019-03-30 MED ORDER — KETOROLAC TROMETHAMINE 30 MG/ML IJ SOLN
INTRAMUSCULAR | Status: DC | PRN
  Administered 2019-03-30: 30 mg via INTRAVENOUS

## 2019-03-30 MED ORDER — CHLORHEXIDINE GLUCONATE 0.12% ORAL RINSE (MEDLINE KIT)
15.0000 mL | Freq: Two times a day (BID) | OROMUCOSAL | Status: DC
  Administered 2019-03-30 (×2): 15 mL via OROMUCOSAL

## 2019-03-30 MED ORDER — SODIUM CHLORIDE 0.9 % IV SOLN
INTRAVENOUS | Status: DC | PRN
  Administered 2019-03-30: 08:00:00 20 ug/min via INTRAVENOUS
  Administered 2019-03-30: 80 ug/min via INTRAVENOUS

## 2019-03-30 MED ORDER — ACETAMINOPHEN 325 MG PO TABS: 325.0000 mg | ORAL_TABLET | ORAL | Status: DC | PRN

## 2019-03-30 MED ORDER — MAGNESIUM SULFATE 2 GM/50ML IV SOLN: 2.0000 g | Freq: Every day | INTRAVENOUS | Status: DC | PRN

## 2019-03-30 MED ORDER — LACTATED RINGERS IV SOLN
INTRAVENOUS | Status: DC | PRN
  Administered 2019-03-30 (×2): via INTRAVENOUS

## 2019-03-30 MED ORDER — FENTANYL CITRATE (PF) 100 MCG/2ML IJ SOLN
50.0000 ug | INTRAMUSCULAR | Status: DC | PRN
  Administered 2019-03-30: 100 ug via INTRAVENOUS
  Administered 2019-03-30: 50 ug via INTRAVENOUS
  Administered 2019-03-30 (×3): 100 ug via INTRAVENOUS
  Filled 2019-03-30 (×5): qty 2

## 2019-03-30 MED ORDER — EPHEDRINE SULFATE-NACL 50-0.9 MG/10ML-% IV SOSY
PREFILLED_SYRINGE | INTRAVENOUS | Status: DC | PRN
  Administered 2019-03-30 (×2): 5 mg via INTRAVENOUS
  Administered 2019-03-30: 10 mg via INTRAVENOUS

## 2019-03-30 MED ORDER — SODIUM CHLORIDE 0.9 % IV SOLN: INTRAVENOUS | Status: DC

## 2019-03-30 MED ORDER — FUROSEMIDE 10 MG/ML IJ SOLN
40.0000 mg | Freq: Once | INTRAMUSCULAR | Status: AC
  Administered 2019-03-30: 40 mg via INTRAVENOUS
  Filled 2019-03-30: qty 4

## 2019-03-30 MED ORDER — ORAL CARE MOUTH RINSE: 15.0000 mL | OROMUCOSAL | Status: DC

## 2019-03-30 MED ORDER — METOPROLOL TARTRATE 5 MG/5ML IV SOLN: 2.0000 mg | INTRAVENOUS | Status: DC | PRN

## 2019-03-30 MED ORDER — HYDROMORPHONE HCL 1 MG/ML IJ SOLN: 0.5000 mg | INTRAMUSCULAR | Status: DC | PRN

## 2019-03-30 MED ORDER — BISACODYL 10 MG RE SUPP: 10.0000 mg | Freq: Every day | RECTAL | Status: DC | PRN

## 2019-03-30 MED ORDER — SODIUM CHLORIDE 0.9 % IV SOLN: 500.0000 mL | Freq: Once | INTRAVENOUS | Status: DC | PRN

## 2019-03-30 MED ORDER — ONDANSETRON HCL 4 MG/2ML IJ SOLN
INTRAMUSCULAR | Status: DC | PRN
  Administered 2019-03-30: 4 mg via INTRAVENOUS

## 2019-03-30 MED ORDER — VASOPRESSIN 20 UNIT/ML IV SOLN
INTRAVENOUS | Status: AC
  Filled 2019-03-30: qty 1

## 2019-03-30 MED ORDER — FLEET ENEMA 7-19 GM/118ML RE ENEM: 1.0000 | ENEMA | Freq: Once | RECTAL | Status: DC | PRN

## 2019-03-30 MED ORDER — IODIXANOL 320 MG/ML IV SOLN
INTRAVENOUS | Status: DC | PRN
  Administered 2019-03-30: 100 mL
  Administered 2019-03-30: 150 mL

## 2019-03-30 MED ORDER — FENTANYL CITRATE (PF) 100 MCG/2ML IJ SOLN: 25.0000 ug | INTRAMUSCULAR | Status: DC | PRN

## 2019-03-30 MED ORDER — CEFAZOLIN SODIUM-DEXTROSE 2-4 GM/100ML-% IV SOLN: 2.0000 g | Freq: Three times a day (TID) | INTRAVENOUS | Status: DC

## 2019-03-30 MED ORDER — ALUM & MAG HYDROXIDE-SIMETH 200-200-20 MG/5ML PO SUSP: 15.0000 mL | ORAL | Status: DC | PRN

## 2019-03-30 MED ORDER — CHLORHEXIDINE GLUCONATE 0.12% ORAL RINSE (MEDLINE KIT)
15.0000 mL | Freq: Two times a day (BID) | OROMUCOSAL | Status: DC
  Administered 2019-03-31: 15 mL via OROMUCOSAL

## 2019-03-30 MED ORDER — ROCURONIUM BROMIDE 10 MG/ML (PF) SYRINGE
PREFILLED_SYRINGE | INTRAVENOUS | Status: DC | PRN
  Administered 2019-03-30: 20 mg via INTRAVENOUS
  Administered 2019-03-30: 50 mg via INTRAVENOUS
  Administered 2019-03-30: 20 mg via INTRAVENOUS
  Administered 2019-03-30: 50 mg via INTRAVENOUS
  Administered 2019-03-30 (×3): 20 mg via INTRAVENOUS
  Administered 2019-03-30: 40 mg via INTRAVENOUS
  Administered 2019-03-30: 10 mg via INTRAVENOUS

## 2019-03-30 MED ORDER — LACTATED RINGERS IV SOLN
INTRAVENOUS | Status: DC | PRN
  Administered 2019-03-30 (×2): via INTRAVENOUS

## 2019-03-30 MED ORDER — EPHEDRINE 5 MG/ML INJ
INTRAVENOUS | Status: AC
  Filled 2019-03-30: qty 10

## 2019-03-30 MED ORDER — ALBUMIN HUMAN 5 % IV SOLN
INTRAVENOUS | Status: DC | PRN
  Administered 2019-03-30 (×2): via INTRAVENOUS

## 2019-03-30 MED ORDER — ACETAMINOPHEN 650 MG RE SUPP: 325.0000 mg | RECTAL | Status: DC | PRN

## 2019-03-30 MED ORDER — VASOPRESSIN 20 UNIT/ML IV SOLN
0.0100 [IU]/min | INTRAVENOUS | Status: AC
  Administered 2019-03-30: 11:00:00 0.03 [IU]/min via INTRAVENOUS
  Filled 2019-03-30: qty 2

## 2019-03-30 MED ORDER — KETOROLAC TROMETHAMINE 30 MG/ML IJ SOLN
INTRAMUSCULAR | Status: AC
  Filled 2019-03-30: qty 1

## 2019-03-30 MED ORDER — PREGABALIN 50 MG PO CAPS
100.0000 mg | ORAL_CAPSULE | Freq: Three times a day (TID) | ORAL | Status: DC
  Administered 2019-03-31: 100 mg
  Filled 2019-03-30: qty 2

## 2019-03-30 MED ORDER — SODIUM CHLORIDE 0.9 % IV SOLN
INTRAVENOUS | Status: DC | PRN
  Administered 2019-03-30 (×2): via INTRAVENOUS

## 2019-03-30 MED ORDER — PHENOL 1.4 % MT LIQD: 1.0000 | OROMUCOSAL | Status: DC | PRN

## 2019-03-30 MED ORDER — VASOPRESSIN 20 UNIT/ML IV SOLN
INTRAVENOUS | Status: DC | PRN
  Administered 2019-03-30: 2 [IU] via INTRAVENOUS
  Administered 2019-03-30: 1 [IU] via INTRAVENOUS
  Administered 2019-03-30: 2 [IU] via INTRAVENOUS

## 2019-03-30 MED ORDER — LABETALOL HCL 5 MG/ML IV SOLN: 10.0000 mg | INTRAVENOUS | Status: DC | PRN

## 2019-03-30 MED ORDER — CALCIUM CHLORIDE 10 % IV SOLN
INTRAVENOUS | Status: AC
  Filled 2019-03-30: qty 10

## 2019-03-30 MED ORDER — ORAL CARE MOUTH RINSE
15.0000 mL | OROMUCOSAL | Status: DC
  Administered 2019-03-30 – 2019-03-31 (×7): 15 mL via OROMUCOSAL

## 2019-03-30 MED ORDER — FENTANYL CITRATE (PF) 250 MCG/5ML IJ SOLN
INTRAMUSCULAR | Status: DC | PRN
  Administered 2019-03-30: 50 ug via INTRAVENOUS
  Administered 2019-03-30: 100 ug via INTRAVENOUS
  Administered 2019-03-30 (×5): 50 ug via INTRAVENOUS

## 2019-03-30 MED ORDER — CEFAZOLIN SODIUM 1 G IJ SOLR
INTRAMUSCULAR | Status: AC
  Filled 2019-03-30: qty 20

## 2019-03-30 MED ORDER — DOCUSATE SODIUM 50 MG/5ML PO LIQD: 100.0000 mg | Freq: Two times a day (BID) | ORAL | Status: DC | PRN

## 2019-03-30 MED ORDER — SODIUM CHLORIDE 0.9 % IV SOLN
INTRAVENOUS | Status: AC
  Filled 2019-03-30: qty 1.2

## 2019-03-30 MED ORDER — MIDAZOLAM HCL 2 MG/2ML IJ SOLN: 2.0000 mg | INTRAMUSCULAR | Status: DC | PRN

## 2019-03-30 MED ORDER — ONDANSETRON HCL 4 MG/2ML IJ SOLN: 4.0000 mg | Freq: Four times a day (QID) | INTRAMUSCULAR | Status: DC | PRN

## 2019-03-30 MED ORDER — ONDANSETRON HCL 4 MG/2ML IJ SOLN
INTRAMUSCULAR | Status: AC
  Filled 2019-03-30: qty 2

## 2019-03-30 MED ORDER — LIDOCAINE 2% (20 MG/ML) 5 ML SYRINGE
INTRAMUSCULAR | Status: AC
  Filled 2019-03-30: qty 5

## 2019-03-30 SURGICAL SUPPLY — 100 items
ADH SKN CLS APL DERMABOND .7 (GAUZE/BANDAGES/DRESSINGS) ×4
ADH SKN CLS LQ APL DERMABOND (GAUZE/BANDAGES/DRESSINGS) ×4
BAG DECANTER FOR FLEXI CONT (MISCELLANEOUS) ×1 IMPLANT
BALLN CODA OCL 2-10-35-140-46 (BALLOONS) ×5
BALLN MUSTANG 12X60X75 (BALLOONS) ×5
BALLOON COD OCL 2-10-35-140-46 (BALLOONS) IMPLANT
BALLOON MUSTANG 12X60X75 (BALLOONS) IMPLANT
BLADE CLIPPER SURG (BLADE) ×5 IMPLANT
CANISTER SUCT 3000ML PPV (MISCELLANEOUS) ×5 IMPLANT
CATH BEACON 5 .035 65 KMP TIP (CATHETERS) ×1 IMPLANT
CATH BEACON 5.038 65CM KMP-01 (CATHETERS) ×4 IMPLANT
CATH OMNI FLUSH .035X70CM (CATHETERS) ×5 IMPLANT
CATH QUICKCROSS SUPP .035X90CM (MICROCATHETER) ×2 IMPLANT
CATH ROBINSON RED A/P 18FR (CATHETERS) ×1 IMPLANT
CLIP VESOCCLUDE LG 6/CT (CLIP) ×1 IMPLANT
CLIP VESOCCLUDE MED 24/CT (CLIP) ×1 IMPLANT
CLIP VESOCCLUDE MED 6/CT (CLIP) ×1 IMPLANT
CLIP VESOCCLUDE SM WIDE 24/CT (CLIP) ×1 IMPLANT
CLIP VESOCCLUDE SM WIDE 6/CT (CLIP) ×1 IMPLANT
COVER PROBE W GEL 5X96 (DRAPES) ×1 IMPLANT
COVER WAND RF STERILE (DRAPES) ×4 IMPLANT
DERMABOND ADHESIVE PROPEN (GAUZE/BANDAGES/DRESSINGS) ×1
DERMABOND ADVANCED (GAUZE/BANDAGES/DRESSINGS) ×1
DERMABOND ADVANCED .7 DNX12 (GAUZE/BANDAGES/DRESSINGS) ×4 IMPLANT
DERMABOND ADVANCED .7 DNX6 (GAUZE/BANDAGES/DRESSINGS) IMPLANT
DEVICE CLOSURE PERCLS PRGLD 6F (VASCULAR PRODUCTS) IMPLANT
DEVICE TORQUE KENDALL .025-038 (MISCELLANEOUS) IMPLANT
DRAPE BRACHIAL (DRAPES) ×1 IMPLANT
DRAPE ORTHO SPLIT 77X108 STRL (DRAPES) ×5
DRAPE SURG ORHT 6 SPLT 77X108 (DRAPES) IMPLANT
DRSG TEGADERM 2-3/8X2-3/4 SM (GAUZE/BANDAGES/DRESSINGS) ×10 IMPLANT
DRSG TEGADERM 4X4.75 (GAUZE/BANDAGES/DRESSINGS) ×1 IMPLANT
DRYSEAL FLEXSHEATH 12FR 33CM (SHEATH) ×1
ELECT CAUTERY BLADE 6.4 (BLADE) ×1 IMPLANT
ELECT REM PT RETURN 9FT ADLT (ELECTROSURGICAL) ×5
ELECTRODE REM PT RTRN 9FT ADLT (ELECTROSURGICAL) ×8 IMPLANT
FELT TEFLON 1X6 (MISCELLANEOUS) ×1 IMPLANT
GAUZE 4X4 16PLY RFD (DISPOSABLE) ×1 IMPLANT
GAUZE SPONGE 2X2 8PLY STRL LF (GAUZE/BANDAGES/DRESSINGS) ×8 IMPLANT
GAUZE SPONGE 4X4 12PLY STRL (GAUZE/BANDAGES/DRESSINGS) ×1 IMPLANT
GAUZE SPONGE 4X4 12PLY STRL LF (GAUZE/BANDAGES/DRESSINGS) ×1 IMPLANT
GLIDEWIRE ADV .035X260CM (WIRE) ×1 IMPLANT
GLOVE BIO SURGEON STRL SZ7.5 (GLOVE) ×5 IMPLANT
GLOVE BIOGEL PI IND STRL 7.5 (GLOVE) IMPLANT
GLOVE BIOGEL PI INDICATOR 7.5 (GLOVE) ×1
GOWN STRL REUS W/ TWL LRG LVL3 (GOWN DISPOSABLE) ×8 IMPLANT
GOWN STRL REUS W/ TWL XL LVL3 (GOWN DISPOSABLE) ×8 IMPLANT
GOWN STRL REUS W/TWL LRG LVL3 (GOWN DISPOSABLE) ×20
GOWN STRL REUS W/TWL XL LVL3 (GOWN DISPOSABLE) ×10
GRAFT BALLN CATH 65CM (STENTS) ×4 IMPLANT
GUIDEWIRE ANGLED .035X150CM (WIRE) ×1 IMPLANT
KIT BASIN OR (CUSTOM PROCEDURE TRAY) ×5 IMPLANT
KIT ENCORE 26 ADVANTAGE (KITS) ×1 IMPLANT
KIT TURNOVER KIT B (KITS) ×5 IMPLANT
LOOP VESSEL MAXI BLUE (MISCELLANEOUS) ×1 IMPLANT
LOOP VESSEL MINI RED (MISCELLANEOUS) ×1 IMPLANT
NS IRRIG 1000ML POUR BTL (IV SOLUTION) ×5 IMPLANT
PACK ENDOVASCULAR (PACKS) ×5 IMPLANT
PAD ARMBOARD 7.5X6 YLW CONV (MISCELLANEOUS) ×10 IMPLANT
PATCH VASC XENOSURE 1CMX6CM (Vascular Products) ×5 IMPLANT
PATCH VASC XENOSURE 1X6 (Vascular Products) IMPLANT
PENCIL BUTTON HOLSTER BLD 10FT (ELECTRODE) ×1 IMPLANT
PERCLOSE PROGLIDE 6F (VASCULAR PRODUCTS)
SET MICROPUNCTURE 5F STIFF (MISCELLANEOUS) ×7 IMPLANT
SHEATH BRITE TIP 8FR 23CM (SHEATH) ×4 IMPLANT
SHEATH DRYSEAL FLEX 12FR 33CM (SHEATH) IMPLANT
SHEATH FAST CATH 10F 12CM (SHEATH) ×1 IMPLANT
SHEATH PINNACLE 5F 10CM (SHEATH) ×2 IMPLANT
SHEATH PINNACLE 8F 10CM (SHEATH) ×4 IMPLANT
SHIELD RADPAD SCOOP 12X17 (MISCELLANEOUS) ×1 IMPLANT
SNARE GOOSENECK 10MM (VASCULAR PRODUCTS) ×1 IMPLANT
SPONGE GAUZE 2X2 STER 10/PKG (GAUZE/BANDAGES/DRESSINGS)
SPONGE INTESTINAL PEANUT (DISPOSABLE) ×1 IMPLANT
SPONGE LAP 18X18 RF (DISPOSABLE) ×3 IMPLANT
STENT GRAFT BALLN CATH 65CM (STENTS)
STENT VIABAHN 13X100X120 (Permanent Stent) ×1 IMPLANT
STENT VIABAHN 13X50X120 (Permanent Stent) ×1 IMPLANT
STENT VICI VENOUS 14X120 (Permanent Stent) ×1 IMPLANT
STOPCOCK MORSE 400PSI 3WAY (MISCELLANEOUS) ×5 IMPLANT
SUT ETHILON 3 0 PS 1 (SUTURE) ×1 IMPLANT
SUT MNCRL AB 4-0 PS2 18 (SUTURE) ×10 IMPLANT
SUT PROLENE 3 0 SH DA (SUTURE) ×8 IMPLANT
SUT PROLENE 4 0 SH DA (SUTURE) ×8 IMPLANT
SUT PROLENE 5 0 C 1 24 (SUTURE) ×10 IMPLANT
SUT SILK 3 0 (SUTURE) ×5
SUT SILK 3-0 18XBRD TIE 12 (SUTURE) IMPLANT
SUT SILK 4 0 (SUTURE) ×4
SUT SILK 4-0 18XBRD TIE 12 (SUTURE) IMPLANT
SUT VIC AB 2-0 CT1 27 (SUTURE) ×16
SUT VIC AB 2-0 CT1 TAPERPNT 27 (SUTURE) IMPLANT
SUT VIC AB 3-0 SH 27 (SUTURE) ×5
SUT VIC AB 3-0 SH 27X BRD (SUTURE) IMPLANT
SYR 20CC LL (SYRINGE) ×5 IMPLANT
SYR BULB IRRIGATION 50ML (SYRINGE) ×1 IMPLANT
TOWEL GREEN STERILE (TOWEL DISPOSABLE) ×5 IMPLANT
TRAY FOLEY MTR SLVR 16FR STAT (SET/KITS/TRAYS/PACK) ×5 IMPLANT
TUBING HIGH PRESSURE 120CM (CONNECTOR) ×5 IMPLANT
WIRE AMPLATZ SS-J .035X180CM (WIRE) ×8 IMPLANT
WIRE BENTSON .035X145CM (WIRE) ×11 IMPLANT
WIRE TORQFLEX AUST .018X40CM (WIRE) ×1 IMPLANT

## 2019-03-30 NOTE — Anesthesia Procedure Notes (Signed)
Central Venous Catheter Insertion Performed by: Audry Pili, MD, anesthesiologist Start/End5/26/2020 2:19 PM, 03/30/2019 2:32 PM Patient location: Pre-op. Preanesthetic checklist: patient identified, IV checked, risks and benefits discussed, surgical consent, monitors and equipment checked, pre-op evaluation, timeout performed and anesthesia consent Position: Trendelenburg Patient sedated Hand hygiene performed , maximum sterile barriers used  and Seldinger technique used Catheter size: 8.5 Fr Central line was placed.Double lumen Procedure performed using ultrasound guided technique. Ultrasound Notes:anatomy identified, needle tip was noted to be adjacent to the nerve/plexus identified, no ultrasound evidence of intravascular and/or intraneural injection and image(s) printed for medical record Attempts: 1 Following insertion, line sutured, dressing applied and Biopatch. Post procedure assessment: blood return through all ports, free fluid flow and no air  Patient tolerated the procedure well with no immediate complications.

## 2019-03-30 NOTE — Consult Note (Addendum)
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 EBL 3.6L. Will remain intubated post-operatively. PCCM asked to consult   History of present illness   53 yo F PMH  CKD 3, DVT, May Thurner syndrome, DVT, HTN, Hypothyroidism, Bipolar Disorder, Anxiety who presents 5/26 with L common external iliac vein occlusion for hybrid endovascular/open repair of L to R fem fem bypass. Chronic LLE swelling had worsened significantly x 4 weeks.  Intraop, pt with EBL 3.6 L, requiring pressors, IVF and blood product resuscitation, including 4PRBC 1 FFP CellSaver. Post-operatively, the patient will remain intubated. PCCM to consult.   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 Remaining intubated, to ICU   Consults:  PCCM   Procedures:  5/26 ETT>> 5/26 A line >>   Significant Diagnostic Tests:    Micro Data:    Antimicrobials:  Ancef 5/26  Interim history/subjective:  Remaining intubated, to ICU   Objective   Blood pressure (!) 151/71, pulse 86, temperature 98.7 F (37.1 C), resp. rate 18, height 5\' 10"  (1.778 m), weight 134.1 kg, SpO2 96 %.        Intake/Output Summary (Last 24 hours) at 03/30/2019 1414 Last data filed at 03/30/2019 1410 Gross per 24 hour  Intake 5565 ml  Output 3985 ml  Net 1580 ml   Filed Weights   03/30/19 0559  Weight: 134.1 kg    Examination: General: Obese adult F, intubated, sedated, NAD  HENT: NCAT ETT OGT secure. Trachea midline. Anicteric sclera Lungs: RUL rhonchi. Symmetrical chest excursion. No accessory muscle recruitment. Synchronous  with vent  Cardiovascular: RRR s1s2 no rgm. 1+ radial pulses Abdomen: Obese, soft, round ndnt, +bowel sounds x4 Extremities: LLE swollen compared to RLE. No cyanosis, no clubbing.  Neuro: Sedated. Awakens to voice but is very drowsy and quickly falls asleep. PERRL.  GU: clear yellow urine Skin: Pale, clean, warm, dry. L groin incision well approximated, clean, without active bleeding or signs of hematoma. RIJ site covered in clean dry gauze.   Resolved Hospital Problem list     Assessment & Plan:   L external common iliac vein occlusion s/p vascular exploration with venous ligation, balloon angioplasty, stent placement; complicated by hemorrhagic shock in setting of blood loss from femoral vein and tear in the sclerotic external leg vein P Per CVTS Monitor for signs of bleeding   Acute respiratory failure requiring mechanical ventilation, post-operative P Maintain mechanical ventilation Pulm Hygiene PEEP/FiO2 for SpO2 >92% AM CXR Discussed with Dr. Elsworth Soho-- will try to hemodynamically equilibrate today and then diurese (in setting of hemorrhagic shock with volume resuscitation)  with hope of successful extubation in morning   Shock -Hypovolemic, acute hemorrhagic shock in setting of operative blood loss 3.6L requiring IVF, b4PRBC 1 FFP Cellsaver  and pressors. Possible component of medication related hypotension in setting of general anesthesia  P  MAP gaol > 65 Check post op CBC, transfuse per unit guideline Pressors as needed for goal    DM P SSI  BPD Anxiety Chronic Pain RLS P PAD ordered for acute management Holding home cymbalta, home adderall PRN midaz available in place  of home Xanax Gabapentin TID  Holding Mag and Requip for RLS at this time   Hypothyroidism P Synthroid   HTN -HCTZ, lasix,  P Holding home antihypertensives in setting of acute hypotension   IBS P Holding home Linzess PRN bowel reg to prevent constipation  CKD3 -at risk for AKI on  CKD in setting of hemorrhagic shock  P MAP goal as above Strict I/O Monitor BMP   Best practice:  Diet: NPO  Pain/Anxiety/Delirium protocol (if indicated): Precedex PRN fent PRN versed  VAP protocol (if indicated): yes DVT prophylaxis: SCD GI prophylaxis: pepcid  Glucose control: SSI Mobility: Bedrest Code Status: Full Family Communication: pending Disposition: ICU   Labs   CBC: Recent Labs  Lab 03/26/19 1408 03/30/19 1157  WBC 7.5 12.8*  HGB 9.4* 8.4*  HCT 31.6* 26.5*  MCV 88.8 88.0  PLT 184 113*    Basic Metabolic Panel: Recent Labs  Lab 03/26/19 1408  NA 141  K 3.2*  CL 101  CO2 28  GLUCOSE 106*  BUN 28*  CREATININE 1.39*  CALCIUM 9.2   GFR: Estimated Creatinine Clearance: 70.8 mL/min (A) (by C-G formula based on SCr of 1.39 mg/dL (H)). Recent Labs  Lab 03/26/19 1408 03/30/19 1157  WBC 7.5 12.8*    Liver Function Tests: Recent Labs  Lab 03/26/19 1408  AST 18  ALT 22  ALKPHOS 125  BILITOT 0.6  PROT 7.5  ALBUMIN 3.7   No results for input(s): LIPASE, AMYLASE in the last 168 hours. No results for input(s): AMMONIA in the last 168 hours.  ABG    Component Value Date/Time   TCO2 31 08/17/2018 0617     Coagulation Profile: Recent Labs  Lab 03/26/19 1408 03/30/19 1216  INR 1.2 1.6*    Cardiac Enzymes: No results for input(s): CKTOTAL, CKMB, CKMBINDEX, TROPONINI in the last 168 hours.  HbA1C: Hgb [name] MFr Bld  Date/Time Value Ref Range Status  03/26/2019 02:08 PM 6.3 (H) 4.8 - 5.6 % Final    Comment:    (NOTE) Pre diabetes:          5.7%-6.4% Diabetes:              >6.4% Glycemic control for   <7.0% adults with diabetes     CBG: Recent Labs  Lab 03/26/19 1217 03/30/19 0551  GLUCAP 121* 95    Review of Systems:   Unable to obtain, patient intubated, sedated   Past Medical History  She,  has a past medical history of Anxiety, Bipolar disorder ([place]), Chronic kidney disease, Constipation, Depression, Diabetes mellitus  without complication ([place]), Dyspnea, GERD (gastroesophageal reflux disease), Headache, High cholesterol, History of DVT (deep vein thrombosis), Hypertension, Hypothyroidism, Obsessive-compulsive disorder, Peripheral vascular disease ([place]), Restless leg syndrome, Trigger thumb of left hand (01/2018), and Trigger thumb of right hand.   Surgical History    Past Surgical History:  Procedure Laterality Date  . ABDOMINAL HYSTERECTOMY  06/2016   complete  . AV FISTULA PLACEMENT Left 11/24/2018   Procedure: ARTERIOVENOUS (AV) FISTULA CREATION LEFT SFA TO LEFT FEMORAL VEIN;  Surgeon: Waynetta Sandy, MD;  Location: [place];  Service: Vascular;  Laterality: Left;  . CHOLECYSTECTOMY    . FEMORAL-FEMORAL BYPASS GRAFT Left 11/24/2018   Procedure: BYPASS GRAFT FEMORAL-FEMORAL VENOUS LEFT TO RIGHT PALMA PROCEDURE USING CRYOVEIN;  Surgeon: Waynetta Sandy, MD;  Location: [place];  Service: Vascular;  Laterality: Left;  . LOWER EXTREMITY VENOGRAPHY N/A 08/17/2018   Procedure: LOWER EXTREMITY VENOGRAPHY - Central Venogram;  Surgeon: Waynetta Sandy, MD;  Location: [place] CV [facility];  Service: Cardiovascular;  Laterality: N/A;  . LOWER EXTREMITY VENOGRAPHY Bilateral 03/09/2019   Procedure: LOWER EXTREMITY VENOGRAPHY;  Surgeon: Waynetta Sandy, MD;  Location: [place] CV [facility];  Service: Cardiovascular;  Laterality: Bilateral;  . LUMBAR FUSION  11/21/2000   L5-S1  . LUMBAR SPINE SURGERY     x 2 others  . TRIGGER FINGER RELEASE Right 12/01/2017   Procedure: RELEASE TRIGGER FINGER/A-1 PULLEY RIGHT THUMB;  Surgeon: Leanora Cover, MD;  Location: [place];  Service: Orthopedics;  Laterality: Right;  . TRIGGER FINGER RELEASE Left 01/26/2018   Procedure: LEFT TRIGGER THUMB RELEASE;  Surgeon: Leanora Cover, MD;  Location: [place];  Service: Orthopedics;  Laterality: Left;     Social History   reports that she has never smoked. She has never used  smokeless tobacco. She reports that she does not drink alcohol or use drugs.   Family History   Her family history includes ADD / ADHD in her daughter; Anxiety disorder in her daughter; Bipolar disorder in her daughter, maternal aunt, and mother; Diabetes in her father; Drug abuse in her daughter and daughter; Heart disease in her brother, father, and mother; Hyperlipidemia in her brother, father, and mother; Hypertension in her brother, father, mother, and sister; Suicidality in her maternal aunt.   Allergies Allergies  Allergen Reactions  . Aripiprazole Other (See Comments)    BECOMES  VIOLENT   . Seroquel [Quetiapine Fumarate] Other (See Comments)    BECOMES VIOLENT  . Chlorpromazine Other (See Comments)    SEVERE ANXIETY   . Gabapentin Other (See Comments)    NIGHTMARES      Home Medications  Prior to Admission medications   Medication Sig Start Date End Date Taking? Authorizing Provider  ALPRAZolam Duanne Moron) 1 MG tablet Take 1 tablet (1 mg total) by mouth 2 (two) times daily as needed for anxiety. 03/23/19 03/22/20 Yes Cloria Spring, MD  amphetamine-dextroamphetamine (ADDERALL) 30 MG tablet Take 1 tablet by mouth 2 (two) times daily. Fill after 10/27/18 09/28/18 09/28/19 Yes Cloria Spring, MD  aspirin EC 81 MG tablet Take 81 mg by mouth daily.   Yes [provider]  atorvastatin (LIPITOR) 20 MG tablet Take 20 mg by mouth daily.   Yes [provider]  cetirizine (ZYRTEC) 10 MG tablet Take 10 mg by mouth daily.   Yes [provider]  Cholecalciferol (VITAMIN D3) 125 MCG (5000 UT) CAPS Take 5,000 Units by mouth at bedtime.   Yes [provider]  cycloSPORINE (RESTASIS) 0.05 % ophthalmic emulsion Place 1 drop into both eyes 2 (two) times daily.   Yes [provider]  DULoxetine (CYMBALTA) 60 MG capsule Take 1 capsule (60 mg total) by mouth 2 (two) times daily. 03/23/19 03/22/20 Yes Cloria Spring, MD  furosemide (LASIX) 40 MG tablet Take 40  mg by mouth as needed (once a day as needed).   Yes [provider]  glipiZIDE (GLUCOTROL XL) 5 MG 24 hr tablet Take 5 mg by mouth daily with breakfast.   Yes [provider]  hydrochlorothiazide (HYDRODIURIL) 25 MG tablet Take 25 mg by mouth daily.   Yes [provider]  HYDROcodone-acetaminophen (NORCO) 10-325 MG tablet Take 1 tablet by mouth every 6 (six) hours as needed (for pain.).   Yes [provider]  levothyroxine (SYNTHROID, LEVOTHROID) 137 MCG tablet Take 137 mcg by mouth daily before breakfast.   Yes [provider]  linaclotide (LINZESS) 145 MCG CAPS capsule Take 145 mcg by mouth daily.    Yes [provider]  Magnesium 250 MG TABS Take 250 mg by mouth daily.   Yes [provider]  Multiple Vitamin (MULTIVITAMIN WITH MINERALS) TABS tablet Take 1 tablet by mouth daily. One-A-Day Women's Multivitamin   Yes [provider]  Omega-3 Fatty Acids (FISH OIL) 1000 MG CAPS Take 1,000 mg by mouth daily.    Yes [provider]  pantoprazole (PROTONIX) 40 MG tablet Take 40 mg by mouth daily.   Yes [provider]  PAZEO 0.7 % SOLN Place 1 drop into both eyes daily.    Yes [provider]  Polyvinyl Alcohol-Povidone (REFRESH OP) Place 1 drop into both eyes 3 (three) times daily.    Yes [provider]  potassium chloride (K-DUR) 10 MEQ tablet Take 10 mEq by mouth 3 (three) times daily.   Yes [provider]  pregabalin (LYRICA) 100 MG capsule Take 100 mg by mouth 3 (three) times daily.   Yes [provider]  rivaroxaban (XARELTO) 20 MG TABS tablet Take 1 tablet (20 mg total) by mouth daily with supper. 12/22/18  Yes Waynetta Sandy, MD  rOPINIRole (REQUIP) 2 MG tablet Take 2 mg by mouth 2 (two) times daily.   Yes [provider]  vitamin C (ASCORBIC ACID) 500 MG tablet Take 500 mg by mouth at bedtime.   Yes [provider]  Melatonin 10 MG TBDP Take  10 mg by mouth at bedtime as needed (for sleep).    [provider]  triamcinolone (KENALOG) 0.1 % paste Use as directed 1 application in the mouth or throat 4 (four) times daily as needed (mouth ulcers).    [provider]     Critical care time: 50 minutes       Eliseo Gum MSN, AGACNP-BC [place] [phone] If no answer, [phone] 03/30/2019, 2:14 PM

## 2019-03-30 NOTE — Anesthesia Postprocedure Evaluation (Signed)
Anesthesia Post Note  Patient: Jane [name]  Procedure(s) Performed: ENDOVASCULAR ILIAC RECANNULATION POSSIBLE OPEN REPAIR (Left ) RECANNULATION OF ILIAC VEIN (Left ) Patch Angioplasty of the Left Femoral Vein using Venosure Biologic patch (Left Groin) Ultrasound Guidance For Vascular Access (Right Neck)     Patient location during evaluation: ICU Anesthesia Type: General Level of consciousness: sedated and patient remains intubated per anesthesia plan Pain management: pain level controlled Vital Signs Assessment: post-procedure vital signs reviewed and stable Respiratory status: patient remains intubated per anesthesia plan Cardiovascular status: On vasopressors to maintain adequate MAP. Postop Assessment: no apparent nausea or vomiting Anesthetic complications: no Comments: Decision made to transport patient to ICU on vent for controlled weaning due to large volume resuscitation during procedure and continued need for vasopressors to maintain adequate MAP.     Last Vitals:  Vitals:   03/30/19 0554 03/30/19 1550  BP: (!) 151/71   Pulse: 86   Resp: 18   Temp: 37.1 C 37 C  SpO2: 96%     Last Pain:  Vitals:   03/30/19 1550  TempSrc: Oral  PainSc:                  Audry Pili

## 2019-03-30 NOTE — Op Note (Signed)
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 Donzetta Matters, MD Assistants: Annamarie Major, MD; Laurence Slate, PA; Leontine Locket, [place] Procedure Performed: 1.  Exploration left common femoral artery and vein greater than 30 days 2.  Patch angioplasty left common femoral vein 3.  Ultrasound-guided cannulation right internal jugular vein 4.  Stent of left common, external iliac veins and left common femoral vein with 13 x 10 cm via bond distally extended with 13 x 5 cm Biobond into the external iliac vein and centrally placed 14 x 132mm Vici stent  Indications: 53 year old female with history of May Thurner syndrome has a previous left to right femoral to femoral vein bypass that has occluded with an AV fistula that is likely patent given the patient's persistent swelling.  She is undergone venogram demonstrates patent right-sided system the left side is occluded at the common femoral vein.  She is indicated for open exploration left common femoral vein possible stenting and possible redo femoral to femoral bypass.  Findings: There was significant scarring in the left groin.  There are multiple collaterals surrounding the left common femoral vein that were ligated.  Femoral vein itself was injured significant blood was lost prior to this being repaired.  After control was obtained we open the common femoral vein was quite sclerotic more cephalad.  Patch angioplasty was performed.  We cannot identify the AV fistula but this was obviously patent given the bleeding from the distal vein.  Previous femorofemoral bypass was occluded.  We were able to pass a wire from the sclerotic external iliac vein into the IVC and confirmed intraluminal access.  We then snared a wire retrograde brought that to the common femoral we  gained access into the femoral vein and confirmed intraluminal access.  We performed balloon angioplasty from the common femoral vein to the level of the IVC.  Unfortunately this caused a large tear in the sclerotic external leg vein.  We then placed covered stents that were viabahn stents across the tear down into the common femoral vein but unfortunately likely occluded the profunda vein.  We extended another covered stent more cephalad.  We then extended into the IVC with uncovered vein stent.  At completion the patient remained intubated secondary to large volume resuscitation.   Procedure:  The patient was identified in the holding area and taken to the operating room where she is placed upon the operative table and general anesthesia induced.  She was to the prepped draped the bilateral neck bilateral groins and thigh area given antibiotics and timeout was called.  We began with incision of the existing incision left groin.  Concomitantly the right neck was accessed with ultrasound guidance.  IJ was identified and noted to be patent.  Cannulated micropuncture needle followed the wire and sheath.  Wire was then placed in the IVC.  A 5 French sheath was placed.  Dr. Trula Slade performed that part of the operation and then began to help me in the groin.  We encountered multiple collaterals as well as significant scar tissue.  Multiple collaterals did bleed resulting in significant blood loss.  We were able to ligate many of these.  We identified the common femoral artery this was retracted laterally.  The common femoral vein was significantly scarred.  We did have an injury to the common femoral vein we had approximately  1500 cc of blood loss in a short amount of time.  This was ligated with multiple 3-0 Prolene sutures with pledgets.  After we gained control of the bleeding were able dissect out the common femoral vein.  A side-biting clamp was placed.  We opened the common femoral vein longitudinally.  We did  have good antegrade backbleeding from this area.  We then placed a bovine pericardial patch.  Patient was given 5000 units of heparin during that repair.  We then began from above trying to cannulate the left common iliac vein.  A central venogram was performed.  We were able to get retrograde likely through a collateral and through the retroperitoneal space get a wire all the way to the groin but this was not a vein.  This was done using Glidewire advantage and quick cross catheter.  Next I cannulated the patch angioplasty in the common femoral vein with micropuncture needle followed by wire.  I attempted to place that sheath but could not given the occlusion at the cephalad common femoral vein.  Under the inguinal ligament identified the sclerotic external iliac vein.  I cannulated this with micropuncture needle followed by wire and sheath.  I was then able to place a Glidewire advantage and then a 5 [place] sheath.  I was able to manipulate this into the IVC and using clear cross catheter I confirmed intraluminal access.  We then snared from above and pulled the wire through and through.  Unfortunately this wire came only to the external iliac vein.  We then brought a quick cross catheter all the way to this level.  The wire was retracted and we directed into the femoral vein we then performed venogram demonstrated intraluminal access.  We then began with balloon angioplasty with a 12 mm balloon starting at the IVC common iliac vein confluence down to the level of the common femoral vein.  Unfortunately with her last balloon angioplasty we did tear the external iliac vein patient had significant blood loss at this time became hypotensive.  Balloon was reinflated at the level of the external leg vein above the tear.  Pressure was held manually we gain control anesthesia recovered.  We then elected to place covered stents.  We then exchanged for a long 12 French sheath over the wire.  A 13 mm viabahn was then placed  into the patch angioplasty this was 10 cm long.  We then extended further cephalad with a 5 cm 13 mm covered stent.  We then identified our IVC and deployed a 14 x 120 mm Vici stent into the IVC.  All this was gently postdilated with 12 mm balloon.  We then performed completion venography which demonstrated patency of our stents.  Satisfied with this we then irrigated our wounds.  We closed in layers in the groin with Vicryl and Monocryl.  In the neck we placed the red rubber catheter and sutured this in place and the sheath was removed.  Plan was to keep the patient intubated transferred to the ICU for ongoing resuscitation.   EBL: 3600 cc Transfusion: 4 units packed red blood cells, 150 cc Cell Saver, 1 unit FFP, 1 unit platelets  Salisha Bardsley C. Donzetta Matters, MD [facility] Office: [phone] Pager: [phone]

## 2019-03-30 NOTE — Progress Notes (Signed)
Pt having increased episodes of bradycardia, down into the 20s, pt hypotensive when this happens but quickly recovers back into sinus rhythm in the 60s. Bowser NP at bedside, orders to D/C precedix and place pads on pt. Atropine at bedside. RN will continue to monitor.

## 2019-03-30 NOTE — H&P (Signed)
Subjective:     Jane [name] is a 53 y.o. female history of left lower extremity chronic swelling.  She has undergone left to right femoral to femoral vein bypass with creation of AV fistula.  Swelling was initially improving but now much worse over the past 4 weeks.  She did attempt water aerobics but the [facility] is now closed.  She remains on Xarelto.  She has had some bleeding that was either from her groins or from her vagina but she states that her wounds are well-healed.  She is otherwise not had any issues.      Past Medical History:  Diagnosis Date  . Anxiety   . Bipolar disorder ([employer])   . Chronic kidney disease    Stage 3 kidney disease;dx by Dr. Sinda Du.   . Depression   . GERD (gastroesophageal reflux disease)   . High cholesterol   . History of DVT (deep vein thrombosis)    left leg  . Hypertension    states under control with meds., has been on med. x 2 years  . Hypothyroidism   . Obsessive-compulsive disorder   . Restless leg syndrome   . Trigger thumb of left hand 01/2018  . Trigger thumb of right hand         Family History  Problem Relation Age of Onset  . Heart disease Mother   . Hyperlipidemia Mother   . Hypertension Mother   . Bipolar disorder Mother   . Diabetes Father   . Heart disease Father   . Hyperlipidemia Father   . Hypertension Father   . Drug abuse Daughter   . ADD / ADHD Daughter   . Drug abuse Daughter   . Anxiety disorder Daughter   . Bipolar disorder Daughter   . Hypertension Sister   . Hypertension Brother   . Hyperlipidemia Brother   . Heart disease Brother   . Bipolar disorder Maternal Aunt   . Suicidality Maternal Aunt         Past Surgical History:  Procedure Laterality Date  . ABDOMINAL HYSTERECTOMY  06/2016   complete  . AV FISTULA PLACEMENT Left 11/24/2018   Procedure: ARTERIOVENOUS (AV) FISTULA CREATION LEFT SFA TO LEFT FEMORAL VEIN;  Surgeon: Waynetta Sandy, MD;  Location: [place];  Service: Vascular;  Laterality: Left;  . CHOLECYSTECTOMY    . FEMORAL-FEMORAL BYPASS GRAFT Left 11/24/2018   Procedure: BYPASS GRAFT FEMORAL-FEMORAL VENOUS LEFT TO RIGHT PALMA PROCEDURE USING CRYOVEIN;  Surgeon: Waynetta Sandy, MD;  Location: [employer];  Service: Vascular;  Laterality: Left;  . LOWER EXTREMITY VENOGRAPHY N/A 08/17/2018   Procedure: LOWER EXTREMITY VENOGRAPHY - Central Venogram;  Surgeon: Waynetta Sandy, MD;  Location: [facility];  Service: Cardiovascular;  Laterality: N/A;  . LUMBAR FUSION  11/21/2000   L5-S1  . LUMBAR SPINE SURGERY     x 2 others  . TRIGGER FINGER RELEASE Right 12/01/2017   Procedure: RELEASE TRIGGER FINGER/A-1 PULLEY RIGHT THUMB;  Surgeon: Leanora Cover, MD;  Location: [place];  Service: Orthopedics;  Laterality: Right;  . TRIGGER FINGER RELEASE Left 01/26/2018   Procedure: LEFT TRIGGER THUMB RELEASE;  Surgeon: Leanora Cover, MD;  Location: [place];  Service: Orthopedics;  Laterality: Left;    Short Social History:  Social History       Tobacco Use  . Smoking status: Never Smoker  . Smokeless tobacco: Never Used  Substance Use Topics  . Alcohol use: No  Allergies  Allergen Reactions  . Aripiprazole Other (See Comments)    BECOMES  VIOLENT   . Quetiapine Fumarate Other (See Comments)    Other reaction(s): Other (See Comments) BECOMES VIOLENT  . Seroquel [Quetiapine Fumarate] Other (See Comments)    BECOMES VIOLENT  . Chlorpromazine Other (See Comments)    SEVERE ANXIETY   . Gabapentin Other (See Comments)    NIGHTMARES           Current Outpatient Medications  Medication Sig Dispense Refill  . ALPRAZolam (XANAX) 1 MG tablet Take 1 tablet (1 mg total) by mouth 2 (two) times daily as needed for anxiety. 60 tablet 2  . amphetamine-dextroamphetamine (ADDERALL) 30 MG tablet Take 1 tablet by mouth 2 (two) times  daily. Fill after 10/27/18 60 tablet 0  . aspirin-acetaminophen-caffeine (EXCEDRIN MIGRAINE) 250-250-65 MG tablet Take 2 tablets by mouth 2 (two) times daily as needed for headache or migraine.     Marland Kitchen atorvastatin (LIPITOR) 20 MG tablet Take 20 mg by mouth daily.    . cetirizine (ZYRTEC) 10 MG tablet Take 10 mg by mouth daily.    . cholecalciferol (VITAMIN D) 1000 units tablet Take 1,000 Units by mouth at bedtime.     . cycloSPORINE (RESTASIS) 0.05 % ophthalmic emulsion Place 1 drop into both eyes 2 (two) times daily.    . DULoxetine (CYMBALTA) 60 MG capsule Take 1 capsule (60 mg total) by mouth 2 (two) times daily. 60 capsule 2  . glipiZIDE (GLUCOTROL XL) 5 MG 24 hr tablet Take 5 mg by mouth daily with breakfast.    . hydrochlorothiazide (HYDRODIURIL) 25 MG tablet Take 25 mg by mouth daily.    Marland Kitchen HYDROcodone-acetaminophen (NORCO) 10-325 MG tablet Take 1 tablet by mouth every 6 (six) hours as needed (for pain.).    Marland Kitchen KLOR-CON M10 10 MEQ tablet Take 10 mEq by mouth 3 (three) times daily.  12  . Krill Oil (OMEGA-3) 500 MG CAPS Take 500 mg by mouth at bedtime.    Marland Kitchen levothyroxine (SYNTHROID, LEVOTHROID) 137 MCG tablet Take 137 mcg by mouth daily before breakfast.    . linaclotide (LINZESS) 145 MCG CAPS capsule Take 145 mcg by mouth daily.     . Magnesium 250 MG TABS Take 250 mg by mouth daily.    . Melatonin 10 MG TBDP Take 10 mg by mouth at bedtime as needed (for sleep).    . Multiple Vitamin (MULTIVITAMIN WITH MINERALS) TABS tablet Take 1 tablet by mouth daily. One-A-Day Women's Multivitamin    . pantoprazole (PROTONIX) 40 MG tablet Take 40 mg by mouth daily.    Marland Kitchen PAZEO 0.7 % SOLN Place 1 drop into both eyes 2 (two) times daily.     . Polyvinyl Alcohol-Povidone (REFRESH OP) Place 1 drop into both eyes 4 (four) times daily.    . potassium chloride (K-DUR) 10 MEQ tablet Take 10 mEq by mouth 3 (three) times daily.    . pregabalin (LYRICA) 100 MG capsule Take 100 mg  by mouth 3 (three) times daily.    . rivaroxaban (XARELTO) 20 MG TABS tablet Take 1 tablet (20 mg total) by mouth daily with supper. 30 tablet 6  . rOPINIRole (REQUIP) 2 MG tablet Take 2 mg by mouth 2 (two) times daily.    Marland Kitchen amLODipine (NORVASC) 10 MG tablet Take 10 mg by mouth daily.     Marland Kitchen aspirin EC 81 MG tablet Take 81 mg by mouth daily.    . diclofenac sodium (VOLTAREN) 1 %  GEL Apply 2-4 g topically 2 (two) times daily as needed (for foot pain.).    Marland Kitchen ELDERBERRY PO Take 50 mg by mouth daily.    . fluticasone (FLONASE) 50 MCG/ACT nasal spray Place 1 spray into both nostrils daily as needed for allergies or rhinitis.    Marland Kitchen ketoconazole (NIZORAL) 2 % cream Apply 1 application topically 2 (two) times daily as needed for irritation.     Marland Kitchen nystatin (MYCOSTATIN/NYSTOP) powder Apply 1 g topically 2 (two) times daily as needed (for skin irritation/rash).      No current facility-administered medications for this visit.     Review of Systems  Constitutional:  Constitutional negative. HENT: HENT negative.  Eyes: Eyes negative.  Respiratory: Positive for shortness of breath.  Cardiovascular: Positive for leg swelling.  Musculoskeletal: Positive for leg pain.  Neurological: Neurological negative. Hematologic: Positive for bruises/bleeds easily.  Psychiatric: Positive for depressed mood.        Objective:   Vitals:   03/30/19 0554  BP: (!) 151/71  Pulse: 86  Resp: 18  Temp: 98.7 F (37.1 C)  SpO2: 96%     Physical Exam HENT:     Head: Normocephalic.  Eyes:     Pupils: Pupils are equal, round, and reactive to light.  Cardiovascular:     Pulses:          Dorsalis pedis pulses are 2+ on the right side.       Posterior tibial pulses are 2+ on the right side.     Comments: I cannot feel distal left pulses due to swelling  There is a strong venous signal on her mons pubis Pulmonary:     Effort: Pulmonary effort is normal.  Abdominal:     General:  Abdomen is flat.  Musculoskeletal:     Left lower leg: Edema present.     Comments: Right lower extremity measures 39-1/2 cm right 50 cm (10 cm beyond the tibial tuberosity)  Skin:    Capillary Refill: Capillary refill takes less than 2 seconds.  Neurological:     General: No focal deficit present.     Mental Status: She is alert.  Psychiatric:        Mood and Affect: Mood normal.        Behavior: Behavior normal.        Thought Content: Thought content normal.        Judgment: Judgment normal.        Assessment/Plan:      53 year old female follows up with now significant left lower extremity swelling after left to right femoral vein to femoral vein bypass for May Thurner syndrome. Repeat venogram demonstrates occluded previous bypass. Plan for hybrid endovascular and open approach today with possible repeat fem-fem bypass with graft.     Waynetta Sandy MD [facility]

## 2019-03-30 NOTE — Anesthesia Procedure Notes (Signed)
Procedure Name: Intubation Date/Time: 03/30/2019 7:37 AM Performed by: Harden Mo, CRNA Pre-anesthesia Checklist: Patient identified, Emergency Drugs available, Suction available and Patient being monitored Patient Re-evaluated:Patient Re-evaluated prior to induction Oxygen Delivery Method: Circle System Utilized Preoxygenation: Pre-oxygenation with 100% oxygen Induction Type: IV induction and Rapid sequence Laryngoscope Size: Glidescope and 4 Grade View: Grade I Tube type: Oral Tube size: 7.5 mm Number of attempts: 1 Airway Equipment and Method: Stylet and Oral airway Placement Confirmation: ETT inserted through vocal cords under direct vision,  positive ETCO2 and breath sounds checked- equal and bilateral Secured at: 22 cm Tube secured with: Tape Dental Injury: Teeth and Oropharynx as per pre-operative assessment

## 2019-03-30 NOTE — Transfer of Care (Signed)
Immediate Anesthesia Transfer of Care Note  Patient: Kristin Barcus  Procedure(s) Performed: ENDOVASCULAR ILIAC RECANNULATION POSSIBLE OPEN REPAIR (Left ) RECANNULATION OF ILIAC VEIN (Left ) Patch Angioplasty of the Left Femoral Vein using Venosure Biologic patch (Left Groin) Ultrasound Guidance For Vascular Access (Right Neck)  Patient Location: SICU  Anesthesia Type:General  Level of Consciousness: sedated and Patient remains intubated per anesthesia plan  Airway & Oxygen Therapy: Patient remains intubated per anesthesia plan and Patient placed on Ventilator (see vital sign flow sheet for setting)  Post-op Assessment: Report given to RN and Post -op Vital signs reviewed and stable  Post vital signs: Reviewed and stable  Last Vitals:  Vitals Value Taken Time  BP    Temp    Pulse    Resp    SpO2      Last Pain:  Vitals:   03/30/19 0559  PainSc: 8       Patients Stated Pain Goal: 3 (09/81/19 1478)  Complications: No apparent anesthesia complications

## 2019-03-30 NOTE — Anesthesia Procedure Notes (Signed)
Arterial Line Insertion Start/End5/26/2020 7:37 AM, 03/30/2019 7:43 AM Performed by: Josephine Igo, CRNA, CRNA  Patient location: OR. Preanesthetic checklist: patient identified, IV checked, site marked, risks and benefits discussed, surgical consent, monitors and equipment checked, pre-op evaluation and anesthesia consent Lidocaine 1% used for infiltration and patient sedated Left, radial was placed Catheter size: 20 G Hand hygiene performed  and maximum sterile barriers used  Allen's test indicative of satisfactory collateral circulation Attempts: 1 Procedure performed without using ultrasound guided technique. Ultrasound Notes:anatomy identified, needle tip was noted to be adjacent to the nerve/plexus identified and no ultrasound evidence of intravascular and/or intraneural injection Following insertion, dressing applied and Biopatch. Post procedure assessment: normal  Patient tolerated the procedure well with no immediate complications.

## 2019-03-31 ENCOUNTER — Inpatient Hospital Stay (HOSPITAL_COMMUNITY): Payer: Medicaid Other

## 2019-03-31 ENCOUNTER — Encounter (HOSPITAL_COMMUNITY): Payer: Self-pay | Admitting: Vascular Surgery

## 2019-03-31 DIAGNOSIS — J9690 Respiratory failure, unspecified, unspecified whether with hypoxia or hypercapnia: Secondary | ICD-10-CM

## 2019-03-31 LAB — CBC
HCT: 25.8 % — ABNORMAL LOW (ref 36.0–46.0)
HCT: 25.4 % — ABNORMAL LOW (ref 36.0–46.0)
Hemoglobin: 8.3 g/dL — ABNORMAL LOW (ref 12.0–15.0)
Hemoglobin: 8 g/dL — ABNORMAL LOW (ref 12.0–15.0)
MCH: 28.1 pg (ref 26.0–34.0)
MCH: 27.5 pg (ref 26.0–34.0)
MCHC: 32.2 g/dL (ref 30.0–36.0)
MCHC: 31.5 g/dL (ref 30.0–36.0)
MCV: 87.5 fL (ref 80.0–100.0)
MCV: 87.3 fL (ref 80.0–100.0)
Platelets: 111 10*3/uL — ABNORMAL LOW (ref 150–400)
Platelets: 122 10*3/uL — ABNORMAL LOW (ref 150–400)
RBC: 2.95 MIL/uL — ABNORMAL LOW (ref 3.87–5.11)
RBC: 2.91 MIL/uL — ABNORMAL LOW (ref 3.87–5.11)
RDW: 16.6 % — ABNORMAL HIGH (ref 11.5–15.5)
RDW: 16.6 % — ABNORMAL HIGH (ref 11.5–15.5)
WBC: 14.7 10*3/uL — ABNORMAL HIGH (ref 4.0–10.5)
WBC: 18.6 10*3/uL — ABNORMAL HIGH (ref 4.0–10.5)
nRBC: 0 % (ref 0.0–0.2)
nRBC: 0 % (ref 0.0–0.2)

## 2019-03-31 LAB — BPAM PLATELET PHERESIS
Blood Product Expiration Date: 202005272359
ISSUE DATE / TIME: 202005261306
Unit Type and Rh: 6200

## 2019-03-31 LAB — BASIC METABOLIC PANEL
Anion gap: 10 (ref 5–15)
BUN: 34 mg/dL — ABNORMAL HIGH (ref 6–20)
CO2: 24 mmol/L (ref 22–32)
Calcium: 8.1 mg/dL — ABNORMAL LOW (ref 8.9–10.3)
Chloride: 108 mmol/L (ref 98–111)
Creatinine, Ser: 1.63 mg/dL — ABNORMAL HIGH (ref 0.44–1.00)
GFR calc Af Amer: 42 mL/min — ABNORMAL LOW (ref >60)
GFR calc non Af Amer: 36 mL/min — ABNORMAL LOW (ref >60)
Glucose, Bld: 129 mg/dL — ABNORMAL HIGH (ref 70–99)
Potassium: 4.1 mmol/L (ref 3.5–5.1)
Sodium: 142 mmol/L (ref 135–145)

## 2019-03-31 LAB — GLUCOSE, CAPILLARY
Glucose-Capillary: 103 mg/dL — ABNORMAL HIGH (ref 70–99)
Glucose-Capillary: 111 mg/dL — ABNORMAL HIGH (ref 70–99)
Glucose-Capillary: 113 mg/dL — ABNORMAL HIGH (ref 70–99)
Glucose-Capillary: 129 mg/dL — ABNORMAL HIGH (ref 70–99)
Glucose-Capillary: 130 mg/dL — ABNORMAL HIGH (ref 70–99)
Glucose-Capillary: 86 mg/dL (ref 70–99)

## 2019-03-31 LAB — PREPARE PLATELET PHERESIS: Unit division: 0

## 2019-03-31 LAB — HEPARIN LEVEL (UNFRACTIONATED)
Heparin Unfractionated: 0.11 IU/mL — ABNORMAL LOW (ref 0.30–0.70)
Heparin Unfractionated: 0.44 IU/mL (ref 0.30–0.70)

## 2019-03-31 LAB — APTT
aPTT: 24 seconds (ref 24–36)
aPTT: 48 seconds — ABNORMAL HIGH (ref 24–36)

## 2019-03-31 MED ORDER — AMPHETAMINE-DEXTROAMPHETAMINE 20 MG PO TABS
30.0000 mg | ORAL_TABLET | Freq: Two times a day (BID) | ORAL | Status: DC
  Administered 2019-03-31: 30 mg via ORAL
  Filled 2019-03-31 (×2): qty 1

## 2019-03-31 MED ORDER — ROPINIROLE HCL 1 MG PO TABS
2.0000 mg | ORAL_TABLET | Freq: Two times a day (BID) | ORAL | Status: DC
  Administered 2019-03-31 – 2019-04-05 (×11): 2 mg via ORAL
  Filled 2019-03-31 (×11): qty 2

## 2019-03-31 MED ORDER — CHLORHEXIDINE GLUCONATE CLOTH 2 % EX PADS
6.0000 | MEDICATED_PAD | Freq: Every day | CUTANEOUS | Status: DC
  Administered 2019-03-31 – 2019-04-02 (×3): 6 via TOPICAL

## 2019-03-31 MED ORDER — MORPHINE SULFATE (PF) 2 MG/ML IV SOLN
INTRAVENOUS | Status: AC
  Filled 2019-03-31: qty 1

## 2019-03-31 MED ORDER — SODIUM CHLORIDE 0.9% FLUSH: 10.0000 mL | INTRAVENOUS | Status: DC | PRN

## 2019-03-31 MED ORDER — HEPARIN (PORCINE) 25000 UT/250ML-% IV SOLN
2150.0000 [IU]/h | INTRAVENOUS | Status: DC
  Administered 2019-03-31: 1600 [IU]/h via INTRAVENOUS
  Administered 2019-04-01: 1850 [IU]/h via INTRAVENOUS
  Administered 2019-04-01: 2050 [IU]/h via INTRAVENOUS
  Administered 2019-04-02 – 2019-04-04 (×5): 2150 [IU]/h via INTRAVENOUS
  Filled 2019-03-31 (×9): qty 250

## 2019-03-31 MED ORDER — ALPRAZOLAM 0.5 MG PO TABS
1.0000 mg | ORAL_TABLET | Freq: Two times a day (BID) | ORAL | Status: DC | PRN
  Filled 2019-03-31: qty 2

## 2019-03-31 MED ORDER — DULOXETINE HCL 60 MG PO CPEP
60.0000 mg | ORAL_CAPSULE | Freq: Two times a day (BID) | ORAL | Status: DC
  Administered 2019-03-31 – 2019-04-05 (×11): 60 mg via ORAL
  Filled 2019-03-31 (×11): qty 1

## 2019-03-31 MED ORDER — AMPHETAMINE-DEXTROAMPHETAMINE 30 MG PO TABS: 30.0000 mg | ORAL_TABLET | Freq: Two times a day (BID) | ORAL | Status: DC

## 2019-03-31 MED ORDER — SODIUM CHLORIDE 0.9% FLUSH
10.0000 mL | Freq: Two times a day (BID) | INTRAVENOUS | Status: DC
  Administered 2019-03-31: 20 mL
  Administered 2019-03-31 – 2019-04-03 (×3): 10 mL

## 2019-03-31 MED ORDER — OXYCODONE-ACETAMINOPHEN 5-325 MG PO TABS
1.0000 | ORAL_TABLET | Freq: Four times a day (QID) | ORAL | Status: DC | PRN
  Administered 2019-03-31 – 2019-04-05 (×14): 1 via ORAL
  Filled 2019-03-31 (×14): qty 1

## 2019-03-31 MED ORDER — PREGABALIN 100 MG PO CAPS
100.0000 mg | ORAL_CAPSULE | Freq: Three times a day (TID) | ORAL | Status: DC
  Administered 2019-03-31 – 2019-04-05 (×15): 100 mg via ORAL
  Filled 2019-03-31 (×2): qty 1
  Filled 2019-03-31: qty 2
  Filled 2019-03-31 (×3): qty 1
  Filled 2019-03-31 (×2): qty 2
  Filled 2019-03-31 (×3): qty 1
  Filled 2019-03-31: qty 2
  Filled 2019-03-31: qty 1
  Filled 2019-03-31 (×2): qty 2

## 2019-03-31 MED ORDER — MORPHINE SULFATE (PF) 2 MG/ML IV SOLN
1.0000 mg | INTRAVENOUS | Status: DC | PRN
  Administered 2019-03-31 – 2019-04-03 (×3): 1 mg via INTRAVENOUS
  Filled 2019-03-31 (×6): qty 1

## 2019-03-31 NOTE — Progress Notes (Signed)
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 EBL 3.6L. Will remain intubated post-operatively. PCCM asked to consult   History of present illness   53 yo F PMH  CKD 3, DVT, May Thurner syndrome, DVT, HTN, Hypothyroidism, Bipolar Disorder, Anxiety who presents 5/26 with L common external iliac vein occlusion for hybrid endovascular/open repair of L to R fem fem bypass. Chronic LLE swelling had worsened significantly x 4 weeks.  Intraop, pt with EBL 3.6 L, requiring pressors, IVF and blood product resuscitation, including 4PRBC 1 FFP CellSaver. Post-operatively, the patient will remain intubated. PCCM to consult.   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 Remaining intubated, to ICU  5/27 off pressors after transfusions.  Consults:  PCCM   Procedures:  5/26 ETT>> 5/26 A line >>   Significant Diagnostic Tests:    Micro Data:    Antimicrobials:  Ancef 5/26  Interim history/subjective:  Off pressors. HGB 9.2. No evidence further bleeding. Awake on vent in no distress.   Objective   Blood pressure 125/63, pulse 75, temperature 97.8 F (36.6 C), temperature source Oral, resp. rate (!) 22, height 5\' 10"  (1.778 m), weight 134.1 kg, SpO2 100 %.    Vent Mode: PRVC FiO2 (%):  [40 %-50 %] 40 % Set Rate:  [16 bmp-20 bmp] 16 bmp Vt Set:  [540 mL] 540 mL PEEP:  [5 cmH20] 5 cmH20 Plateau Pressure:  [20 WPY09-98 cmH20] 20 cmH20   Intake/Output Summary (Last 24 hours) at 03/31/2019 0902 Last data filed at 03/31/2019 0800 Gross per 24 hour  Intake 5163.75 ml  Output  4660 ml  Net 503.75 ml   Filed Weights   03/30/19 0559  Weight: 134.1 kg    Examination: General:  Middle aged obese female Neuro:  Awake, oriented on vent HEENT:  [place]/AT, No JVD noted, PERRL Cardiovascular:  RRR, no MRG. Distal Pulses intact.  Lungs:  Clear bilateral Abdomen:  Soft, non-distended, non-tender.  Musculoskeletal:  No acute deformity. Compression wrap to LLE.  Skin:  Intact, MMM   Resolved Hospital Problem list     Assessment & Plan:   L external common iliac vein occlusion s/p vascular exploration with venous ligation, balloon angioplasty, stent placement; complicated by hemorrhagic shock in setting of blood loss from femoral vein and tear in the sclerotic external leg vein -Per Vascular surgery -Monitor for signs of bleeding  -Repeat CBC now and in AM  Acute respiratory failure requiring mechanical ventilation, post-operative - SBT - Wean to extubate  Shock -Hypovolemic, acute hemorrhagic shock in setting of operative blood loss 3.6L requiring IVF, b4PRBC 1 FFP Cellsaver  and pressors. Possible component of medication related hypotension in setting of general anesthesia  - MAP gaol > 65 - Check post op CBC, transfuse per unit guideline - Pressors as needed for goal    DM - SSI  BPD Anxiety Chronic Pain RLS - wean sedation to facilitate SBT.  - Holding home cymbalta, home adderall - Gabapentin TID  - Holding Mag and Requip for RLS at this time   Hypothyroidism - Synthroid   HTN -HCTZ, lasix,  P -  Holding home antihypertensives in setting of acute hypotension   IBS P -Holding home Linzess -PRN bowel reg to prevent constipation  CKD3 -at risk for AKI on CKD in setting of hemorrhagic shock  P -MAP goal as above -Strict I/O -Monitor BMP   Best practice:  Diet: NPO  Pain/Anxiety/Delirium protocol (if indicated): Precedex PRN fent PRN versed  VAP protocol (if indicated): yes DVT prophylaxis: SCD GI prophylaxis: pepcid  Glucose  control: SSI Mobility: Bedrest Code Status: Full Family Communication: Disposition: ICU   Labs   CBC: Recent Labs  Lab 03/26/19 1408  03/30/19 1157 03/30/19 1347 03/30/19 1406 03/30/19 1514 03/30/19 1557  WBC 7.5  --  12.8*  --   --  23.5*  --   HGB 9.4*   < > 8.4* 6.8* 7.5* 9.2* 9.2*  HCT 31.6*   < > 26.5* 20.0* 22.0* 28.3* 27.0*  MCV 88.8  --  88.0  --   --  87.6  --   PLT 184  --  113*  --   --  126*  --    < > = values in this interval not displayed.    Basic Metabolic Panel: Recent Labs  Lab 03/26/19 1408 03/30/19 0911 03/30/19 1130 03/30/19 1347 03/30/19 1406 03/30/19 1514 03/30/19 1557  NA 141 140 142 141 141 141 141  K 3.2* 3.8 4.2 4.2 4.2 4.3 4.3  CL 101  --   --   --   --  109  --   CO2 28  --   --   --   --  24  --   GLUCOSE 106* 110*  --   --  207* 212*  --   BUN 28*  --   --   --   --  19  --   CREATININE 1.39*  --   --   --   --  1.26*  --   CALCIUM 9.2  --   --   --   --  8.1*  --   MG  --   --   --   --   --  1.7  --    GFR: Estimated Creatinine Clearance: 78.1 mL/min (A) (by C-G formula based on SCr of 1.26 mg/dL (H)). Recent Labs  Lab 03/26/19 1408 03/30/19 1157 03/30/19 1514  WBC 7.5 12.8* 23.5*    Liver Function Tests: Recent Labs  Lab 03/26/19 1408  AST 18  ALT 22  ALKPHOS 125  BILITOT 0.6  PROT 7.5  ALBUMIN 3.7   No results for input(s): LIPASE, AMYLASE in the last 168 hours. No results for input(s): AMMONIA in the last 168 hours.  ABG    Component Value Date/Time   PHART 7.339 (L) 03/30/2019 1557   PCO2ART 44.6 03/30/2019 1557   PO2ART 84.0 03/30/2019 1557   HCO3 24.0 03/30/2019 1557   TCO2 25 03/30/2019 1557   ACIDBASEDEF 2.0 03/30/2019 1557   O2SAT 96.0 03/30/2019 1557     Coagulation Profile: Recent Labs  Lab 03/26/19 1408 03/30/19 1216 03/30/19 1514  INR 1.2 1.6* 1.3*    Cardiac Enzymes: No results for input(s): CKTOTAL, CKMB, CKMBINDEX, TROPONINI in the last 168 hours.  HbA1C: Hgb [name] MFr Bld   Date/Time Value Ref Range Status  03/26/2019 02:08 PM 6.3 (H) 4.8 - 5.6 % Final    Comment:    (NOTE) Pre diabetes:          5.7%-6.4% Diabetes:              >  6.4% Glycemic control for   <7.0% adults with diabetes     CBG: Recent Labs  Lab 03/30/19 1526 03/30/19 2009 03/30/19 2352 03/31/19 0341 03/31/19 0723  GLUCAP 194* 169* 145* 111* 103*    Review of Systems:   Unable to obtain, patient intubated, sedated   Past Medical History  She,  has a past medical history of Anxiety, Bipolar disorder ([place]), Chronic kidney disease, Constipation, Depression, Diabetes mellitus without complication ([place]), Dyspnea, GERD (gastroesophageal reflux disease), Headache, High cholesterol, History of DVT (deep vein thrombosis), Hypertension, Hypothyroidism, Obsessive-compulsive disorder, Peripheral vascular disease ([place]), Restless leg syndrome, Trigger thumb of left hand (01/2018), and Trigger thumb of right hand.   Surgical History    Past Surgical History:  Procedure Laterality Date  . ABDOMINAL HYSTERECTOMY  06/2016   complete  . AV FISTULA PLACEMENT Left 11/24/2018   Procedure: ARTERIOVENOUS (AV) FISTULA CREATION LEFT SFA TO LEFT FEMORAL VEIN;  Surgeon: Waynetta Sandy, MD;  Location: [facility];  Service: Vascular;  Laterality: Left;  . CHOLECYSTECTOMY    . FEMORAL-FEMORAL BYPASS GRAFT Left 11/24/2018   Procedure: BYPASS GRAFT FEMORAL-FEMORAL VENOUS LEFT TO RIGHT PALMA PROCEDURE USING CRYOVEIN;  Surgeon: Waynetta Sandy, MD;  Location: [facility];  Service: Vascular;  Laterality: Left;  . LOWER EXTREMITY VENOGRAPHY N/A 08/17/2018   Procedure: LOWER EXTREMITY VENOGRAPHY - Central Venogram;  Surgeon: Waynetta Sandy, MD;  Location: [facility];  Service: Cardiovascular;  Laterality: N/A;  . LOWER EXTREMITY VENOGRAPHY Bilateral 03/09/2019   Procedure: LOWER EXTREMITY VENOGRAPHY;  Surgeon: Waynetta Sandy, MD;  Location: [facility];  Service:  Cardiovascular;  Laterality: Bilateral;  . LUMBAR FUSION  11/21/2000   L5-S1  . LUMBAR SPINE SURGERY     x 2 others  . TRIGGER FINGER RELEASE Right 12/01/2017   Procedure: RELEASE TRIGGER FINGER/A-1 PULLEY RIGHT THUMB;  Surgeon: Leanora Cover, MD;  Location: [facility];  Service: Orthopedics;  Laterality: Right;  . TRIGGER FINGER RELEASE Left 01/26/2018   Procedure: LEFT TRIGGER THUMB RELEASE;  Surgeon: Leanora Cover, MD;  Location: [facility];  Service: Orthopedics;  Laterality: Left;     Social History   reports that she has never smoked. She has never used smokeless tobacco. She reports that she does not drink alcohol or use drugs.   Family History   Her family history includes ADD / ADHD in her daughter; Anxiety disorder in her daughter; Bipolar disorder in her daughter, maternal aunt, and mother; Diabetes in her father; Drug abuse in her daughter and daughter; Heart disease in her brother, father, and mother; Hyperlipidemia in her brother, father, and mother; Hypertension in her brother, father, mother, and sister; Suicidality in her maternal aunt.   Allergies Allergies  Allergen Reactions  . Aripiprazole Other (See Comments)    BECOMES  VIOLENT   . Seroquel [Quetiapine Fumarate] Other (See Comments)    BECOMES VIOLENT  . Chlorpromazine Other (See Comments)    SEVERE ANXIETY   . Gabapentin Other (See Comments)    NIGHTMARES      Home Medications  Prior to Admission medications   Medication Sig Start Date End Date Taking? Authorizing Provider  ALPRAZolam Duanne Moron) 1 MG tablet Take 1 tablet (1 mg total) by mouth 2 (two) times daily as needed for anxiety. 03/23/19 03/22/20 Yes Cloria Spring, MD  amphetamine-dextroamphetamine (ADDERALL) 30 MG tablet Take 1 tablet by mouth 2 (two) times daily. Fill after 10/27/18 09/28/18 09/28/19 Yes Cloria Spring, MD  aspirin EC 81 MG tablet Take 81 mg by mouth daily.   Yes [provider]  atorvastatin  (LIPITOR) 20 MG tablet Take 20 mg by mouth daily.   Yes [provider]  cetirizine (ZYRTEC) 10 MG tablet Take 10 mg by mouth daily.   Yes [provider]  Cholecalciferol (VITAMIN D3) 125 MCG (5000 UT) CAPS Take 5,000 Units by mouth at bedtime.   Yes [provider]  cycloSPORINE (RESTASIS) 0.05 % ophthalmic emulsion Place 1 drop into both eyes 2 (two) times daily.   Yes [provider]  DULoxetine (CYMBALTA) 60 MG capsule Take 1 capsule (60 mg total) by mouth 2 (two) times daily. 03/23/19 03/22/20 Yes Cloria Spring, MD  furosemide (LASIX) 40 MG tablet Take 40 mg by mouth as needed (once a day as needed).   Yes [provider]  glipiZIDE (GLUCOTROL XL) 5 MG 24 hr tablet Take 5 mg by mouth daily with breakfast.   Yes [provider]  hydrochlorothiazide (HYDRODIURIL) 25 MG tablet Take 25 mg by mouth daily.   Yes [provider]  HYDROcodone-acetaminophen (NORCO) 10-325 MG tablet Take 1 tablet by mouth every 6 (six) hours as needed (for pain.).   Yes [provider]  levothyroxine (SYNTHROID, LEVOTHROID) 137 MCG tablet Take 137 mcg by mouth daily before breakfast.   Yes [provider]  linaclotide (LINZESS) 145 MCG CAPS capsule Take 145 mcg by mouth daily.    Yes [provider]  Magnesium 250 MG TABS Take 250 mg by mouth daily.   Yes [provider]  Multiple Vitamin (MULTIVITAMIN WITH MINERALS) TABS tablet Take 1 tablet by mouth daily. One-A-Day Women's Multivitamin   Yes [provider]  Omega-3 Fatty Acids (FISH OIL) 1000 MG CAPS Take 1,000 mg by mouth daily.    Yes [provider]  pantoprazole (PROTONIX) 40 MG tablet Take 40 mg by mouth daily.   Yes [provider]  PAZEO 0.7 % SOLN Place 1 drop into both eyes daily.    Yes [provider]  Polyvinyl Alcohol-Povidone (REFRESH OP) Place 1 drop into both eyes 3 (three) times daily.    Yes [provider]   potassium chloride (K-DUR) 10 MEQ tablet Take 10 mEq by mouth 3 (three) times daily.   Yes [provider]  pregabalin (LYRICA) 100 MG capsule Take 100 mg by mouth 3 (three) times daily.   Yes [provider]  rivaroxaban (XARELTO) 20 MG TABS tablet Take 1 tablet (20 mg total) by mouth daily with supper. 12/22/18  Yes Waynetta Sandy, MD  rOPINIRole (REQUIP) 2 MG tablet Take 2 mg by mouth 2 (two) times daily.   Yes [provider]  vitamin C (ASCORBIC ACID) 500 MG tablet Take 500 mg by mouth at bedtime.   Yes [provider]  Melatonin 10 MG TBDP Take 10 mg by mouth at bedtime as needed (for sleep).    [provider]  triamcinolone (KENALOG) 0.1 % paste Use as directed 1 application in the mouth or throat 4 (four) times daily as needed (mouth ulcers).    [provider]     Critical care time: 35 minutes      Georgann Housekeeper, AGACNP-BC Central Pager [phone] or [phone]  03/31/2019 9:08 AM

## 2019-03-31 NOTE — Progress Notes (Addendum)
  Progress Note    03/31/2019 8:00 AM 1 Day Post-Op  Subjective:  On vent however following commands and nodding   Vitals:   03/31/19 0630 03/31/19 0724  BP: (!) 134/53   Pulse:    Resp: 16   Temp:  97.8 F (36.6 C)  SpO2: 100%    Physical Exam: Lungs:  Ventilator Incisions:  L groin incision c/d/i; R neck cath site c/d/i Extremities:  Palpable R DP; LLE with sequential compression wrap Abdomen:  Soft Neurologic: following commands and nodding  CBC    Component Value Date/Time   WBC 23.5 (H) 03/30/2019 1514   RBC 3.23 (L) 03/30/2019 1514   HGB 9.2 (L) 03/30/2019 1557   HCT 27.0 (L) 03/30/2019 1557   PLT 126 (L) 03/30/2019 1514   MCV 87.6 03/30/2019 1514   MCH 28.5 03/30/2019 1514   MCHC 32.5 03/30/2019 1514   RDW 15.7 (H) 03/30/2019 1514    BMET    Component Value Date/Time   NA 141 03/30/2019 1557   K 4.3 03/30/2019 1557   CL 109 03/30/2019 1514   CO2 24 03/30/2019 1514   GLUCOSE 212 (H) 03/30/2019 1514   BUN 19 03/30/2019 1514   CREATININE 1.26 (H) 03/30/2019 1514   CALCIUM 8.1 (L) 03/30/2019 1514   GFRNONAA 49 (L) 03/30/2019 1514   GFRAA 57 (L) 03/30/2019 1514    INR    Component Value Date/Time   INR 1.3 (H) 03/30/2019 1514     Intake/Output Summary (Last 24 hours) at 03/31/2019 0800 Last data filed at 03/31/2019 0600 Gross per 24 hour  Intake 6153.11 ml  Output 4935 ml  Net 1218.11 ml     Assessment/Plan:  53 y.o. female is s/p patch L CFV and stenting of L common and external iliac veins with blood loss requiring large volume resuscitation 1 Day Post-Op  Continue to wean neo Vent management per CCM; we appreciate their help ABL anemia: recheck CBC this morning Renal: UOP 567ml overnight; check BMP this morning Extremities: continue compression wrap LLE Will discuss timing of anticoagulation with the attending  ADDENDUM: Patient will be started on full dose IV heparin, no bolus.  Pharmacy consulted.  Dagoberto Ligas, PA-C [facility] [phone] 03/31/2019 8:00 AM   I agree with the above.  Have seen and evaluated the patient.  She is now extubated.  Her incision is clean and dry.  She has an Ace wrap on her left leg.  Her heparin drip was restarted this morning.  I will get her fitted for compression stockings.  We will monitor her in the ICU today with plans to transfer to [place]

## 2019-03-31 NOTE — Progress Notes (Signed)
ANTICOAGULATION CONSULT NOTE - [facility] for Heparin Indication: DVT  Allergies  Allergen Reactions  . Aripiprazole Other (See Comments)    BECOMES  VIOLENT   . Seroquel [Quetiapine Fumarate] Other (See Comments)    BECOMES VIOLENT  . Chlorpromazine Other (See Comments)    SEVERE ANXIETY   . Gabapentin Other (See Comments)    NIGHTMARES     Patient Measurements: Height: 5\' 10"  (177.8 cm) Weight: 295 lb 9.6 oz (134.1 kg) IBW/kg (Calculated) : 68.5 Heparin Dosing Weight: 100.2  Vital Signs: Temp: 97.8 F (36.6 C) (05/27 0724) Temp Source: Oral (05/27 0724) BP: 121/69 (05/27 1700) Pulse Rate: 108 (05/27 0946)  Labs: Recent Labs    03/30/19 1216  03/30/19 1514 03/30/19 1557 03/31/19 0843 03/31/19 1101 03/31/19 1340 03/31/19 1739  HGB  --    < > 9.2* 9.2* 8.3*  --  8.0*  --   HCT  --    < > 28.3* 27.0* 25.8*  --  25.4*  --   PLT  --   --  126*  --  111*  --  122*  --   APTT 171*  --  35  --   --  24  --  48*  LABPROT 19.1*  --  16.1*  --   --   --   --   --   INR 1.6*  --  1.3*  --   --   --   --   --   HEPARINUNFRC  --   --   --   --   --  0.11*  --  0.44  CREATININE  --   --  1.26*  --   --   --  1.63*  --    < > = values in this interval not displayed.    Estimated Creatinine Clearance: 60.4 mL/min (A) (by C-G formula based on SCr of 1.63 mg/dL (H)).   Medical History: Past Medical History:  Diagnosis Date  . Anxiety   . Bipolar disorder ([place])   . Chronic kidney disease    Stage 3 kidney disease;dx by Dr. Sinda Du.   . Constipation   . Depression   . Diabetes mellitus without complication ([place])   . Dyspnea    with exertion  . GERD (gastroesophageal reflux disease)   . Headache    migraines  . High cholesterol   . History of DVT (deep vein thrombosis)    left leg  . Hypertension    states under control with meds., has been on med. x 2 years  . Hypothyroidism   . Obsessive-compulsive disorder   . Peripheral  vascular disease ([place])   . Restless leg syndrome   . Trigger thumb of left hand 01/2018  . Trigger thumb of right hand     Assessment: 53 YO F s/p patch L CFV and stenting of L common and external iliac veins with blood loss requiring large volume resuscitation. Now post-op day 1. Was on Xarelto PTA with last dose taken 5/25 at 1730.  Heparin started this morning, initial heparin level falsely elevated by recent Xarelto use, aPTT slightly below goal. Per RN no bleeding throughout shift.  Goal of Therapy:  Heparin level 0.3-0.7 units/ml aPTT 66-102 seconds Monitor platelets by anticoagulation protocol: Yes   Plan:  -Increase heparin to 1850 units/hr -Recheck aPTT in 6hr   Arrie Senate, PharmD, BCPS Clinical Pharmacist [phone] Please check AMION for all [facility] numbers 03/31/2019

## 2019-03-31 NOTE — Progress Notes (Signed)
ANTICOAGULATION CONSULT NOTE - Initial Consult  Pharmacy Consult for Heparin Indication: DVT  Allergies  Allergen Reactions  . Aripiprazole Other (See Comments)    BECOMES  VIOLENT   . Seroquel [Quetiapine Fumarate] Other (See Comments)    BECOMES VIOLENT  . Chlorpromazine Other (See Comments)    SEVERE ANXIETY   . Gabapentin Other (See Comments)    NIGHTMARES     Patient Measurements: Height: 5\' 10"  (177.8 cm) Weight: 295 lb 9.6 oz (134.1 kg) IBW/kg (Calculated) : 68.5 Heparin Dosing Weight: 100.2  Vital Signs: Temp: 97.8 F (36.6 C) (05/27 0724) Temp Source: Oral (05/27 0724) BP: 125/63 (05/27 0946) Pulse Rate: 108 (05/27 0946)  Labs: Recent Labs    03/30/19 1157 03/30/19 1216  03/30/19 1514 03/30/19 1557 03/31/19 0843  HGB 8.4*  --    < > 9.2* 9.2* 8.3*  HCT 26.5*  --    < > 28.3* 27.0* 25.8*  PLT 113*  --   --  126*  --  111*  APTT  --  171*  --  35  --   --   LABPROT  --  19.1*  --  16.1*  --   --   INR  --  1.6*  --  1.3*  --   --   CREATININE  --   --   --  1.26*  --   --    < > = values in this interval not displayed.    Estimated Creatinine Clearance: 78.1 mL/min (A) (by C-G formula based on SCr of 1.26 mg/dL (H)).   Medical History: Past Medical History:  Diagnosis Date  . Anxiety   . Bipolar disorder ([place])   . Chronic kidney disease    Stage 3 kidney disease;dx by Dr. Sinda Du.   . Constipation   . Depression   . Diabetes mellitus without complication ([place])   . Dyspnea    with exertion  . GERD (gastroesophageal reflux disease)   . Headache    migraines  . High cholesterol   . History of DVT (deep vein thrombosis)    left leg  . Hypertension    states under control with meds., has been on med. x 2 years  . Hypothyroidism   . Obsessive-compulsive disorder   . Peripheral vascular disease ([place])   . Restless leg syndrome   . Trigger thumb of left hand 01/2018  . Trigger thumb of right hand     Assessment: 53 YO F s/p patch  L CFV and stenting of L common and external iliac veins with blood loss requiring large volume resuscitation. Now post-op day 1. Was on Xarelto PTA with last dose taken 5/25 at 1730.  No bleeding noted today, Hgb low at 8.3, plts ok, will start heparin with no bolus.  Goal of Therapy:  Heparin level 0.3-0.7 units/ml aPTT 66-102 seconds Monitor platelets by anticoagulation protocol: Yes   Plan:  Start heparin infusion at 1600 units/hr - no bolus Baseline aPTT and HL APTT and HL in 6 hours and daily Monitor daily CBC, Heparin level, aPTT, and s/sx bleeding   Juanell Fairly, PharmD PGY1 Pharmacy Resident 03/31/2019,10:13 AM

## 2019-03-31 NOTE — Procedures (Signed)
Extubation Procedure Note  Patient Details:   Name: Jane [name] DOB: Apr 04, 1966 MRN: [id number]   Airway Documentation:    Vent end date: 03/31/19 Vent end time: 0946   Evaluation  O2 sats: stable throughout Complications: No apparent complications Patient did tolerate procedure well. Bilateral Breath Sounds: Diminished   Yes   Patient was extubated to a 4L [place] without any complications, dyspnea or stridor noted. Patient was instructed on x 10, highest goal reached was 1250.  Claretta Fraise 03/31/2019, 9:46 AM

## 2019-04-01 LAB — GLUCOSE, CAPILLARY
Glucose-Capillary: 126 mg/dL — ABNORMAL HIGH (ref 70–99)
Glucose-Capillary: 115 mg/dL — ABNORMAL HIGH (ref 70–99)
Glucose-Capillary: 153 mg/dL — ABNORMAL HIGH (ref 70–99)
Glucose-Capillary: 131 mg/dL — ABNORMAL HIGH (ref 70–99)
Glucose-Capillary: 110 mg/dL — ABNORMAL HIGH (ref 70–99)

## 2019-04-01 LAB — CBC
HCT: 22.4 % — ABNORMAL LOW (ref 36.0–46.0)
Hemoglobin: 7.1 g/dL — ABNORMAL LOW (ref 12.0–15.0)
MCH: 28.4 pg (ref 26.0–34.0)
MCHC: 31.7 g/dL (ref 30.0–36.0)
MCV: 89.6 fL (ref 80.0–100.0)
Platelets: 126 10*3/uL — ABNORMAL LOW (ref 150–400)
RBC: 2.5 MIL/uL — ABNORMAL LOW (ref 3.87–5.11)
RDW: 16.9 % — ABNORMAL HIGH (ref 11.5–15.5)
WBC: 18.1 10*3/uL — ABNORMAL HIGH (ref 4.0–10.5)
nRBC: 0 % (ref 0.0–0.2)

## 2019-04-01 LAB — APTT
aPTT: 59 seconds — ABNORMAL HIGH (ref 24–36)
aPTT: 73 seconds — ABNORMAL HIGH (ref 24–36)
aPTT: 64 seconds — ABNORMAL HIGH (ref 24–36)

## 2019-04-01 LAB — LACTIC ACID, PLASMA: Lactic Acid, Venous: 1.1 mmol/L (ref 0.5–1.9)

## 2019-04-01 LAB — PREPARE FRESH FROZEN PLASMA
Unit division: 0
Unit division: 0

## 2019-04-01 LAB — MAGNESIUM: Magnesium: 2 mg/dL (ref 1.7–2.4)

## 2019-04-01 LAB — BASIC METABOLIC PANEL
Anion gap: 10 (ref 5–15)
BUN: 34 mg/dL — ABNORMAL HIGH (ref 6–20)
CO2: 25 mmol/L (ref 22–32)
Calcium: 7.9 mg/dL — ABNORMAL LOW (ref 8.9–10.3)
Chloride: 104 mmol/L (ref 98–111)
Creatinine, Ser: 1.48 mg/dL — ABNORMAL HIGH (ref 0.44–1.00)
GFR calc Af Amer: 47 mL/min — ABNORMAL LOW (ref >60)
GFR calc non Af Amer: 40 mL/min — ABNORMAL LOW (ref >60)
Glucose, Bld: 128 mg/dL — ABNORMAL HIGH (ref 70–99)
Potassium: 3.8 mmol/L (ref 3.5–5.1)
Sodium: 139 mmol/L (ref 135–145)

## 2019-04-01 LAB — BPAM FFP
Blood Product Expiration Date: 202005312359
Blood Product Expiration Date: 202005312359
ISSUE DATE / TIME: 202005261300
ISSUE DATE / TIME: 202005261300
Unit Type and Rh: 6200
Unit Type and Rh: 6200

## 2019-04-01 LAB — PHOSPHORUS: Phosphorus: 3.5 mg/dL (ref 2.5–4.6)

## 2019-04-01 LAB — HEPARIN LEVEL (UNFRACTIONATED): Heparin Unfractionated: 0.71 IU/mL — ABNORMAL HIGH (ref 0.30–0.70)

## 2019-04-01 MED ORDER — FAMOTIDINE 20 MG PO TABS
20.0000 mg | ORAL_TABLET | Freq: Two times a day (BID) | ORAL | Status: DC
  Administered 2019-04-01 – 2019-04-05 (×8): 20 mg via ORAL
  Filled 2019-04-01 (×8): qty 1

## 2019-04-01 MED ORDER — ASPIRIN EC 81 MG PO TBEC
81.0000 mg | DELAYED_RELEASE_TABLET | Freq: Every day | ORAL | Status: DC
  Administered 2019-04-01 – 2019-04-05 (×5): 81 mg via ORAL
  Filled 2019-04-01 (×5): qty 1

## 2019-04-01 MED ORDER — LORATADINE 10 MG PO TABS
10.0000 mg | ORAL_TABLET | Freq: Every day | ORAL | Status: DC
  Administered 2019-04-01 – 2019-04-05 (×5): 10 mg via ORAL
  Filled 2019-04-01 (×5): qty 1

## 2019-04-01 MED ORDER — LINACLOTIDE 145 MCG PO CAPS
145.0000 ug | ORAL_CAPSULE | Freq: Every day | ORAL | Status: DC
  Filled 2019-04-01: qty 1

## 2019-04-01 MED ORDER — LINACLOTIDE 145 MCG PO CAPS
145.0000 ug | ORAL_CAPSULE | Freq: Every day | ORAL | Status: DC
  Administered 2019-04-02 – 2019-04-05 (×4): 145 ug via ORAL
  Filled 2019-04-01 (×5): qty 1

## 2019-04-01 MED ORDER — LEVOTHYROXINE SODIUM 25 MCG PO TABS
137.0000 ug | ORAL_TABLET | Freq: Every day | ORAL | Status: DC
  Administered 2019-04-01 – 2019-04-05 (×5): 137 ug via ORAL
  Filled 2019-04-01 (×6): qty 1

## 2019-04-01 MED ORDER — HYDROCHLOROTHIAZIDE 25 MG PO TABS
25.0000 mg | ORAL_TABLET | Freq: Every day | ORAL | Status: DC
  Administered 2019-04-01 – 2019-04-05 (×5): 25 mg via ORAL
  Filled 2019-04-01 (×6): qty 1

## 2019-04-01 MED ORDER — AMPHETAMINE-DEXTROAMPHETAMINE 20 MG PO TABS
30.0000 mg | ORAL_TABLET | Freq: Two times a day (BID) | ORAL | Status: DC
  Administered 2019-04-01 – 2019-04-02 (×3): 30 mg via ORAL
  Filled 2019-04-01 (×3): qty 3

## 2019-04-01 MED ORDER — HYDROCODONE-ACETAMINOPHEN 10-325 MG PO TABS: 1.0000 | ORAL_TABLET | Freq: Four times a day (QID) | ORAL | Status: DC | PRN

## 2019-04-01 MED ORDER — ATORVASTATIN CALCIUM 10 MG PO TABS
20.0000 mg | ORAL_TABLET | Freq: Every day | ORAL | Status: DC
  Administered 2019-04-01 – 2019-04-05 (×5): 20 mg via ORAL
  Filled 2019-04-01 (×6): qty 2

## 2019-04-01 MED ORDER — FUROSEMIDE 40 MG PO TABS: 40.0000 mg | ORAL_TABLET | ORAL | Status: DC | PRN

## 2019-04-01 MED ORDER — GLIPIZIDE ER 5 MG PO TB24
5.0000 mg | ORAL_TABLET | Freq: Every day | ORAL | Status: DC
  Administered 2019-04-02 – 2019-04-05 (×4): 5 mg via ORAL
  Filled 2019-04-01 (×4): qty 1

## 2019-04-01 MED ORDER — BISACODYL 5 MG PO TBEC
5.0000 mg | DELAYED_RELEASE_TABLET | Freq: Every day | ORAL | Status: DC | PRN
  Administered 2019-04-02: 5 mg via ORAL
  Filled 2019-04-01: qty 1

## 2019-04-01 NOTE — Progress Notes (Signed)
ANTICOAGULATION CONSULT NOTE - [facility] for Heparin Indication: DVT  Allergies  Allergen Reactions  . Aripiprazole Other (See Comments)    BECOMES  VIOLENT   . Seroquel [Quetiapine Fumarate] Other (See Comments)    BECOMES VIOLENT  . Chlorpromazine Other (See Comments)    SEVERE ANXIETY   . Gabapentin Other (See Comments)    NIGHTMARES     Patient Measurements: Height: 5\' 10"  (177.8 cm) Weight: 295 lb 9.6 oz (134.1 kg) IBW/kg (Calculated) : 68.5 Heparin Dosing Weight: 100.2  Vital Signs: Temp: 97.6 F (36.4 C) (05/27 2351) Temp Source: Oral (05/27 2351) BP: 120/56 (05/27 2300) Pulse Rate: 108 (05/27 2300)  Labs: Recent Labs    03/30/19 1216  03/30/19 1514 03/30/19 1557 03/31/19 0843 03/31/19 1101 03/31/19 1340 03/31/19 1739 04/01/19 0042  HGB  --    < > 9.2* 9.2* 8.3*  --  8.0*  --   --   HCT  --    < > 28.3* 27.0* 25.8*  --  25.4*  --   --   PLT  --   --  126*  --  111*  --  122*  --   --   APTT 171*  --  35  --   --  24  --  48* 59*  LABPROT 19.1*  --  16.1*  --   --   --   --   --   --   INR 1.6*  --  1.3*  --   --   --   --   --   --   HEPARINUNFRC  --   --   --   --   --  0.11*  --  0.44  --   CREATININE  --   --  1.26*  --   --   --  1.63*  --   --    < > = values in this interval not displayed.    Estimated Creatinine Clearance: 60.4 mL/min (A) (by C-G formula based on SCr of 1.63 mg/dL (H)).   M  Assessment: 53 YO F s/p patch L CFV and stenting of L common and external iliac veins with blood loss requiring large volume resuscitation. Now post-op day 1. Was on Xarelto PTA with last dose taken 5/25 at 1730.  APTT this am 59 sec  Goal of Therapy:  Heparin level 0.3-0.7 units/ml aPTT 66-102 seconds Monitor platelets by anticoagulation protocol: Yes   Plan:  -Increase heparin to 2050 units/hr -Recheck aPTT in 6hr  Thanks for allowing pharmacy to be a part of this patient's care.  Excell Seltzer, PharmD Clinical  Pharmacist

## 2019-04-01 NOTE — Progress Notes (Addendum)
[facility]  Subjective  - Doing better day by day.     Objective 124/61 99 98.1 F (36.7 C) (Oral) 14 100%  Intake/Output Summary (Last 24 hours) at 04/01/2019 0743 Last data filed at 04/01/2019 0700 Gross per 24 hour  Intake 540.39 ml  Output 1000 ml  Net -459.61 ml    B LE thigh compression on Active range of motion B LE intact Left groin incision healing well 4 x 4 placed in groin crease to Protec the incision Heart RRR tachy at 99, BP 124/61   Assessment/Planning: POD # 2 53 y.o. female is s/p patch L CFV and stenting of L common and external iliac veins with blood loss requiring large volume resuscitation  Heart RRR tachy at 99, BP 124/61 HGB 7.1 currently asymptomatic Good urine output 1000 cc last 24 hours Will observe for now and transfer to 4E later today and and order cbc in am.  PT/OT ordered she will likely need HH but we will see how she does.  Roxy Horseman 04/01/2019 7:43 AM --  Laboratory Lab Results: Recent Labs    03/31/19 1340 04/01/19 0435  WBC 18.6* 18.1*  HGB 8.0* 7.1*  HCT 25.4* 22.4*  PLT 122* 126*   BMET Recent Labs    03/30/19 1514 03/30/19 1557 03/31/19 1340  NA 141 141 142  K 4.3 4.3 4.1  CL 109  --  108  CO2 24  --  24  GLUCOSE 212*  --  129*  BUN 19  --  34*  CREATININE 1.26*  --  1.63*  CALCIUM 8.1*  --  8.1*    COAG Lab Results  Component Value Date   INR 1.3 (H) 03/30/2019   INR 1.6 (H) 03/30/2019   INR 1.2 03/26/2019   No results found for: PTT   I have independently interviewed and examined patient and agree with PA assessment and plan above. Transfer to 4e. Recheck a.m. labs. Continue compression stockings and elevation. Will get pt evaluation.  Brandon C. Donzetta Matters, MD [facility] Office: [phone] Pager: [phone]

## 2019-04-01 NOTE — Progress Notes (Signed)
ANTICOAGULATION CONSULT NOTE - [place] for Heparin Indication: DVT  Allergies  Allergen Reactions  . Aripiprazole Other (See Comments)    BECOMES  VIOLENT   . Seroquel [Quetiapine Fumarate] Other (See Comments)    BECOMES VIOLENT  . Chlorpromazine Other (See Comments)    SEVERE ANXIETY   . Gabapentin Other (See Comments)    NIGHTMARES     Patient Measurements: Height: 5\' 10"  (177.8 cm) Weight: 295 lb 9.6 oz (134.1 kg) IBW/kg (Calculated) : 68.5 Heparin Dosing Weight: 100.2  Vital Signs: Temp: 98.1 F (36.7 C) (05/28 0736) Temp Source: Oral (05/28 0736) BP: 119/59 (05/28 0900) Pulse Rate: 101 (05/28 1000)  Labs: Recent Labs    03/30/19 1216  03/30/19 1514  03/31/19 0843 03/31/19 1101 03/31/19 1340 03/31/19 1739 04/01/19 0042 04/01/19 0435 04/01/19 0900  HGB  --    < > 9.2*   < > 8.3*  --  8.0*  --   --  7.1*  --   HCT  --    < > 28.3*   < > 25.8*  --  25.4*  --   --  22.4*  --   PLT  --   --  126*  --  111*  --  122*  --   --  126*  --   APTT 171*  --  35  --   --  24  --  48* 59*  --  73*  LABPROT 19.1*  --  16.1*  --   --   --   --   --   --   --   --   INR 1.6*  --  1.3*  --   --   --   --   --   --   --   --   HEPARINUNFRC  --   --   --   --   --  0.11*  --  0.44  --  0.71*  --   CREATININE  --   --  1.26*  --   --   --  1.63*  --   --  1.48*  --    < > = values in this interval not displayed.    Estimated Creatinine Clearance: 66.5 mL/min (A) (by C-G formula based on SCr of 1.48 mg/dL (H)).   Medical History: Past Medical History:  Diagnosis Date  . Anxiety   . Bipolar disorder ([place])   . Chronic kidney disease    Stage 3 kidney disease;dx by Dr. Sinda Du.   . Constipation   . Depression   . Diabetes mellitus without complication ([place])   . Dyspnea    with exertion  . GERD (gastroesophageal reflux disease)   . Headache    migraines  . High cholesterol   . History of DVT (deep vein thrombosis)    left leg   . Hypertension    states under control with meds., has been on med. x 2 years  . Hypothyroidism   . Obsessive-compulsive disorder   . Peripheral vascular disease ([place])   . Restless leg syndrome   . Trigger thumb of left hand 01/2018  . Trigger thumb of right hand     Assessment: 53 YO F s/p patch L CFV and stenting of L common and external iliac veins with blood loss requiring large volume resuscitation. Now post-op day 1. Was on Xarelto PTA with last dose taken 5/25 at 1730.  APTT now therapeutic at 73 on  2050 units/hr. Hgb trending down to 7.1, plts 126, SCR trending down to 1.48. No bleeding per RN.   Goal of Therapy:  Heparin level 0.3-0.7 units/ml aPTT 66-102 seconds Monitor platelets by anticoagulation protocol: Yes   Plan:  -Continue heparin at 2050 units/hr -Recheck aPTT in 6hr -Monitor daily heparin level, aPTT, CBC, s/sx bleeding    Juanell Fairly, PharmD PGY1 Pharmacy Resident [phone] Please check AMION for all [place] numbers 04/01/2019

## 2019-04-01 NOTE — Progress Notes (Signed)
ANTICOAGULATION CONSULT NOTE - [place] for Heparin Indication: DVT  Allergies  Allergen Reactions  . Aripiprazole Other (See Comments)    BECOMES  VIOLENT   . Seroquel [Quetiapine Fumarate] Other (See Comments)    BECOMES VIOLENT  . Chlorpromazine Other (See Comments)    SEVERE ANXIETY   . Gabapentin Other (See Comments)    NIGHTMARES     Patient Measurements: Height: 5\' 10"  (177.8 cm) Weight: 295 lb 9.6 oz (134.1 kg) IBW/kg (Calculated) : 68.5 Heparin Dosing Weight: 100.2  Vital Signs: Temp: 98.6 F (37 C) (05/28 1554) Temp Source: Oral (05/28 1554) BP: 108/70 (05/28 1800) Pulse Rate: 84 (05/28 1800)  Labs: Recent Labs    03/30/19 1216  03/30/19 1514  03/31/19 0843 03/31/19 1101 03/31/19 1340 03/31/19 1739 04/01/19 0042 04/01/19 0435 04/01/19 0900 04/01/19 1700  HGB  --    < > 9.2*   < > 8.3*  --  8.0*  --   --  7.1*  --   --   HCT  --    < > 28.3*   < > 25.8*  --  25.4*  --   --  22.4*  --   --   PLT  --   --  126*  --  111*  --  122*  --   --  126*  --   --   APTT 171*  --  35  --   --  24  --  48* 59*  --  73* 64*  LABPROT 19.1*  --  16.1*  --   --   --   --   --   --   --   --   --   INR 1.6*  --  1.3*  --   --   --   --   --   --   --   --   --   HEPARINUNFRC  --   --   --   --   --  0.11*  --  0.44  --  0.71*  --   --   CREATININE  --   --  1.26*  --   --   --  1.63*  --   --  1.48*  --   --    < > = values in this interval not displayed.    Estimated Creatinine Clearance: 66.5 mL/min (A) (by C-G formula based on SCr of 1.48 mg/dL (H)).   Medical History: Past Medical History:  Diagnosis Date  . Anxiety   . Bipolar disorder ([place])   . Chronic kidney disease    Stage 3 kidney disease;dx by Dr. Sinda Du.   . Constipation   . Depression   . Diabetes mellitus without complication ([place])   . Dyspnea    with exertion  . GERD (gastroesophageal reflux disease)   . Headache    migraines  . High cholesterol   .  History of DVT (deep vein thrombosis)    left leg  . Hypertension    states under control with meds., has been on med. x 2 years  . Hypothyroidism   . Obsessive-compulsive disorder   . Peripheral vascular disease ([place])   . Restless leg syndrome   . Trigger thumb of left hand 01/2018  . Trigger thumb of right hand     Assessment: 53 YO F s/p patch L CFV and stenting of L common and external iliac veins with blood loss requiring large  volume resuscitation. Now post-op day 1. Was on Xarelto PTA with last dose taken 5/25 at 1730.  aPTT this evening is slightly SUBtherapeutic (aPTT 64 << 73). No issues noted per discussion with the RN. Will increase slightly and recheck in 6 hours.  Goal of Therapy:  Heparin level 0.3-0.7 units/ml aPTT 66-102 seconds Monitor platelets by anticoagulation protocol: Yes   Plan:  - Increase Heparin drip to 2150 units/hr (21.5 ml/hr) - Will continue to monitor for any signs/symptoms of bleeding and will follow up with aPTT in 6 hours   Thank you for allowing pharmacy to be a part of this patient's care.  Alycia Rossetti, PharmD, BCPS Clinical Pharmacist Clinical phone for 04/01/2019: [phone] 04/01/2019 8:05 PM   **Pharmacist phone directory can now be found on [email or web address] ([email or web address]).  Listed under [place].

## 2019-04-02 DIAGNOSIS — L8990 Pressure ulcer of unspecified site, unspecified stage: Secondary | ICD-10-CM

## 2019-04-02 LAB — GLUCOSE, CAPILLARY
Glucose-Capillary: 105 mg/dL — ABNORMAL HIGH (ref 70–99)
Glucose-Capillary: 105 mg/dL — ABNORMAL HIGH (ref 70–99)
Glucose-Capillary: 114 mg/dL — ABNORMAL HIGH (ref 70–99)
Glucose-Capillary: 117 mg/dL — ABNORMAL HIGH (ref 70–99)
Glucose-Capillary: 119 mg/dL — ABNORMAL HIGH (ref 70–99)
Glucose-Capillary: 141 mg/dL — ABNORMAL HIGH (ref 70–99)

## 2019-04-02 LAB — CBC
HCT: 21.7 % — ABNORMAL LOW (ref 36.0–46.0)
Hemoglobin: 6.9 g/dL — CL (ref 12.0–15.0)
MCH: 28.9 pg (ref 26.0–34.0)
MCHC: 31.8 g/dL (ref 30.0–36.0)
MCV: 90.8 fL (ref 80.0–100.0)
Platelets: 131 10*3/uL — ABNORMAL LOW (ref 150–400)
RBC: 2.39 MIL/uL — ABNORMAL LOW (ref 3.87–5.11)
RDW: 16.2 % — ABNORMAL HIGH (ref 11.5–15.5)
WBC: 13.8 10*3/uL — ABNORMAL HIGH (ref 4.0–10.5)
nRBC: 0.2 % (ref 0.0–0.2)

## 2019-04-02 LAB — PHOSPHORUS: Phosphorus: 3.5 mg/dL (ref 2.5–4.6)

## 2019-04-02 LAB — HEPARIN LEVEL (UNFRACTIONATED): Heparin Unfractionated: 0.63 IU/mL (ref 0.30–0.70)

## 2019-04-02 LAB — APTT
aPTT: 71 seconds — ABNORMAL HIGH (ref 24–36)
aPTT: 93 seconds — ABNORMAL HIGH (ref 24–36)

## 2019-04-02 LAB — PREPARE RBC (CROSSMATCH)

## 2019-04-02 LAB — MAGNESIUM: Magnesium: 2.3 mg/dL (ref 1.7–2.4)

## 2019-04-02 MED ORDER — SODIUM CHLORIDE 0.9% IV SOLUTION
Freq: Once | INTRAVENOUS | Status: AC
  Administered 2019-04-02: 17:00:00 via INTRAVENOUS

## 2019-04-02 MED ORDER — DOCUSATE SODIUM 100 MG PO CAPS
100.0000 mg | ORAL_CAPSULE | Freq: Two times a day (BID) | ORAL | Status: DC | PRN
  Administered 2019-04-02: 100 mg via ORAL
  Filled 2019-04-02: qty 1

## 2019-04-02 MED ORDER — BUTALBITAL-APAP-CAFFEINE 50-325-40 MG PO TABS
1.0000 | ORAL_TABLET | Freq: Once | ORAL | Status: AC | PRN
  Administered 2019-04-02: 1 via ORAL
  Filled 2019-04-02: qty 1

## 2019-04-02 MED FILL — Sodium Chloride IV Soln 0.9%: INTRAVENOUS | Qty: 1000 | Status: AC

## 2019-04-02 MED FILL — Heparin Sodium (Porcine) Inj 1000 Unit/ML: INTRAMUSCULAR | Qty: 30 | Status: AC

## 2019-04-02 NOTE — Evaluation (Signed)
Occupational Therapy Evaluation Patient Details Name: Jane [name] MRN: [id number] DOB: 1966/01/22 Today's Date: 04/02/2019    History of Present Illness 53 y.o. female is s/p patch L CFV and stenting of L common and external iliac veins    Clinical Impression   PTA Pt was Mod I at home with toilet riser and shower chair for ADL. Today Pt is min guard assist for transfer from recliner to [facility], min guard for peri care, mod A for LB ADL due to pain from sx. Pt able to perform bed mobility without assist (even return supine). Pt will benefit from skilled OT in the acute setting as well as afterwards at the [facility] level to focus not only on safety and independence in ADL and functional transfers but IADL as Pt lives alone. OT will continue to follow acutely.     Follow Up Recommendations  Home health OT    Equipment Recommendations  None recommended by OT(Pt has appropriate DME)    Recommendations for Other Services       Precautions / Restrictions Precautions Precautions: Fall Restrictions Weight Bearing Restrictions: No      Mobility Bed Mobility Overal bed mobility: Independent             General bed mobility comments: incr time needed, use of bed rails  Transfers Overall transfer level: Needs assistance Equipment used: Rolling walker (2 wheeled) Transfers: Sit to/from [facility] Sit to Stand: Min guard Stand pivot transfers: Min guard;Min assist(for balance)       General transfer comment: vc for safe hand placement, able to power up without physical assist    Balance Overall balance assessment: Needs assistance Sitting-balance support: No upper extremity supported;Feet supported Sitting balance-Leahy Scale: Fair     Standing balance support: Bilateral upper extremity supported;During functional activity Standing balance-Leahy Scale: Poor Standing balance comment: relies on UE support                           ADL either  performed or assessed with clinical judgement   ADL Overall ADL's : Needs assistance/impaired Eating/Feeding: Modified independent   Grooming: Wash/dry hands;Wash/dry face;Set up;Sitting Grooming Details (indicate cue type and reason): on BSC Upper Body Bathing: Moderate assistance   Lower Body Bathing: Moderate assistance;Sitting/lateral leans   Upper Body Dressing : Minimal assistance   Lower Body Dressing: Moderate assistance;Sit to/from stand   Toilet Transfer: Min Statistician Details (indicate cue type and reason): face to face transfer Toileting- Clothing Manipulation and Hygiene: Minimal assistance;Sit to/from stand Toileting - Clothing Manipulation Details (indicate cue type and reason): min A for balance, Pt able to perform peri care with warm wash cloth     Functional mobility during ADLs: Min guard General ADL Comments: fatigues quickly, decreased activity tolerance     Vision Patient Visual Report: No change from baseline       Perception     Praxis      Pertinent Vitals/Pain Pain Assessment: Faces Faces Pain Scale: Hurts little more Pain Location: headache and LE pain Pain Descriptors / Indicators: Aching;Grimacing;Guarding Pain Intervention(s): Monitored during session;Repositioned;Limited activity within patient's tolerance     Hand Dominance Right   Extremity/Trunk Assessment Upper Extremity Assessment Upper Extremity Assessment: Overall WFL for tasks assessed   Lower Extremity Assessment Lower Extremity Assessment: Defer to PT evaluation   Cervical / Trunk Assessment Cervical / Trunk Assessment: Normal   Communication Communication Communication: No difficulties   Cognition Arousal/Alertness: Awake/alert Behavior  During Therapy: WFL for tasks assessed/performed Overall Cognitive Status: Within Functional Limits for tasks assessed                                     General Comments  Pt stated "I've  been doing it on my own and I know I need help this time" also concerned about the compression socks "I think they gave me a wound on my calf and my heel"    Exercises     Shoulder Instructions      Home Living Family/patient expects to be discharged to:: Private residence Living Arrangements: Alone Available Help at Discharge: Available PRN/intermittently;Family;Friend(s) Type of Home: House Home Access: Stairs to enter [employer] of Steps: 6-7 Entrance Stairs-Rails: Left Home Layout: One level     Bathroom Shower/Tub: Teacher, early years/pre: Standard     Home Equipment: Toilet riser;Shower seat          Prior Functioning/Environment Level of Independence: Independent        Comments: but has been struggling recently        OT Problem List: Decreased range of motion;Decreased activity tolerance;Impaired balance (sitting and/or standing);Decreased knowledge of use of DME or AE;Decreased knowledge of precautions;Obesity;Pain;Increased edema      OT Treatment/Interventions: Self-care/ADL training;DME and/or AE instruction;Therapeutic activities;Patient/family education;Balance training;Energy conservation    OT Goals(Current goals can be found in the care plan section) Acute Rehab OT Goals Patient Stated Goal: to go home OT Goal Formulation: With patient Time For Goal Achievement: 04/16/19 Potential to Achieve Goals: Good ADL Goals Pt Will Perform Grooming: with modified independence;standing Pt Will Perform Lower Body Bathing: with modified independence;with adaptive equipment;sit to/from stand Pt Will Perform Lower Body Dressing: with modified independence;with adaptive equipment;sit to/from stand Pt Will Transfer to Toilet: with modified independence;ambulating Pt Will Perform Toileting - Clothing Manipulation and hygiene: with modified independence;sit to/from stand Pt Will Perform Tub/Shower Transfer: Tub transfer;ambulating;shower  seat;with supervision  OT Frequency: Min 2X/week   Barriers to D/C:            Co-evaluation              AM-PAC OT "6 Clicks" Daily Activity     Outcome Measure Help from another person eating meals?: None Help from another person taking care of personal grooming?: A Little Help from another person toileting, which includes using toliet, bedpan, or urinal?: A Little Help from another person bathing (including washing, rinsing, drying)?: A Lot Help from another person to put on and taking off regular upper body clothing?: A Little Help from another person to put on and taking off regular lower body clothing?: A Lot 6 Click Score: 17   End of Session Equipment Utilized During Treatment: Gait belt;Rolling walker Nurse Communication: Mobility status  Activity Tolerance: Patient tolerated treatment well Patient left: in bed;with call bell/phone within reach  OT Visit Diagnosis: Unsteadiness on feet (R26.81);Other abnormalities of gait and mobility (R26.89);Muscle weakness (generalized) (M62.81);Pain Pain - Right/Left: Left Pain - part of body: Leg                Time: 5409-8119 OT Time Calculation (min): 22 min Charges:  OT General Charges $OT Visit: 1 Visit OT Evaluation $OT Eval Moderate Complexity: [place] OTR/L Acute Rehabilitation Services Pager: [phone] Office: [place] 04/02/2019, 3:39 PM

## 2019-04-02 NOTE — Progress Notes (Signed)
Patient transferred to 4E on tele in wheelchair.   Receiving RN at bedside.

## 2019-04-02 NOTE — Progress Notes (Signed)
ANTICOAGULATION CONSULT NOTE - Follow Up Consult  Pharmacy Consult for heparin Indication: DVT  Labs: Recent Labs    03/30/19 1216  03/30/19 1514  03/31/19 0843  03/31/19 1340 03/31/19 1739  04/01/19 0435 04/01/19 0900 04/01/19 1700 04/02/19 0326  HGB  --    < > 9.2*   < > 8.3*  --  8.0*  --   --  7.1*  --   --   --   HCT  --    < > 28.3*   < > 25.8*  --  25.4*  --   --  22.4*  --   --   --   PLT  --   --  126*  --  111*  --  122*  --   --  126*  --   --   --   APTT 171*  --  35  --   --    < >  --  48*   < >  --  73* 64* 71*  LABPROT 19.1*  --  16.1*  --   --   --   --   --   --   --   --   --   --   INR 1.6*  --  1.3*  --   --   --   --   --   --   --   --   --   --   HEPARINUNFRC  --   --   --   --   --    < >  --  0.44  --  0.71*  --   --  0.63  CREATININE  --   --  1.26*  --   --   --  1.63*  --   --  1.48*  --   --   --    < > = values in this interval not displayed.    Assessment/Plan:  53yo female therapeutic on heparin after rate changes. Will continue gtt at current rate and confirm stable with additional PTT.   Wynona Neat, PharmD, BCPS  04/02/2019,4:11 AM

## 2019-04-02 NOTE — Consult Note (Addendum)
[place] Nurse wound consult note Reason for Consult: Consult requested for pressure injuries to left leg, reported to be related to compression stockings.  Reviewed progress notes and vascular team is aware of their presence. Wound type: Deep tissue pressure injuries Pressure Injury POA: No Measurement: Left posterior thigh 20X.5cm and linear, tibial area 4X2cm and linear Wound bed: dark purple intact skin Dressing procedure/placement/frequency: Foam dressings to protect and promote healing.  Wounds are high risk to evolve into full thickness tissue loss within 7 days,  WOC team will reassess next week.  Julien Girt MSN, RN, [place], [place], [place]

## 2019-04-02 NOTE — Progress Notes (Signed)
04/02/2019 1350 Received pt to room 4E-22 from [employer].  Pt is A&O, no C/O voiced.  Tele monitor applied and CCMD notified.  CHG bath given.  Oriented to room, call light and bed.  Call bell in reach. [place]

## 2019-04-02 NOTE — Progress Notes (Signed)
ANTICOAGULATION CONSULT NOTE - [place] for Heparin Indication: DVT  Allergies  Allergen Reactions  . Aripiprazole Other (See Comments)    BECOMES  VIOLENT   . Seroquel [Quetiapine Fumarate] Other (See Comments)    BECOMES VIOLENT  . Chlorpromazine Other (See Comments)    SEVERE ANXIETY   . Gabapentin Other (See Comments)    NIGHTMARES     Patient Measurements: Height: 5\' 10"  (177.8 cm) Weight: 295 lb 9.6 oz (134.1 kg) IBW/kg (Calculated) : 68.5 Heparin Dosing Weight: 100.2  Vital Signs: Temp: 98.5 F (36.9 C) (05/29 0739) Temp Source: Oral (05/29 0739) BP: 119/68 (05/29 0900) Pulse Rate: 76 (05/29 0900)  Labs: Recent Labs    03/30/19 1514  03/31/19 1340 03/31/19 1739  04/01/19 0435  04/01/19 1700 04/02/19 0326 04/02/19 0942  HGB 9.2*   < > 8.0*  --   --  7.1*  --   --  6.9*  --   HCT 28.3*   < > 25.4*  --   --  22.4*  --   --  21.7*  --   PLT 126*   < > 122*  --   --  126*  --   --  131*  --   APTT 35   < >  --  48*   < >  --    < > 64* 71* 93*  LABPROT 16.1*  --   --   --   --   --   --   --   --   --   INR 1.3*  --   --   --   --   --   --   --   --   --   HEPARINUNFRC  --    < >  --  0.44  --  0.71*  --   --  0.63  --   CREATININE 1.26*  --  1.63*  --   --  1.48*  --   --   --   --    < > = values in this interval not displayed.    Estimated Creatinine Clearance: 66.5 mL/min (A) (by C-G formula based on SCr of 1.48 mg/dL (H)).   Medical History: Past Medical History:  Diagnosis Date  . Anxiety   . Bipolar disorder ([place])   . Chronic kidney disease    Stage 3 kidney disease;dx by Dr. Sinda Du.   . Constipation   . Depression   . Diabetes mellitus without complication ([place])   . Dyspnea    with exertion  . GERD (gastroesophageal reflux disease)   . Headache    migraines  . High cholesterol   . History of DVT (deep vein thrombosis)    left leg  . Hypertension    states under control with meds., has been on  med. x 2 years  . Hypothyroidism   . Obsessive-compulsive disorder   . Peripheral vascular disease ([place])   . Restless leg syndrome   . Trigger thumb of left hand 01/2018  . Trigger thumb of right hand     Assessment: 53 YO F s/p patch L CFV and stenting of L common and external iliac veins with blood loss requiring large volume resuscitation. Now post-op day 1. Was on Xarelto PTA with last dose taken 5/25 at 1730.  Heparin level and aptt at goal this morning and appear to be correlating. Hgb trending down to 6.9 , plt low/stable at 131. No  overt bleeding noted.   Goal of Therapy:  Heparin level 0.3-0.7 units/ml aPTT 66-102 seconds Monitor platelets by anticoagulation protocol: Yes   Plan:  - Continue heparin Heparin drip at 2150 units/hr (21.5 ml/hr) - Will continue to monitor for any signs/symptoms of bleeding    Thank you for allowing pharmacy to be a part of this patient's care.  Erin Hearing PharmD., BCPS Clinical Pharmacist 04/02/2019 12:23 PM  **Pharmacist phone directory can now be found on [email or web address] ([email or web address]).  Listed under [place].

## 2019-04-02 NOTE — Evaluation (Signed)
Physical Therapy Evaluation Patient Details Name: Jane [name] MRN: [id number] DOB: [birth year] Today's Date: 04/02/2019   History of Present Illness  53 y.o. female is s/p patch L CFV and stenting of L common and external iliac veins   Clinical Impression  Pt admitted with above diagnosis. Pt currently with functional limitations due to the deficits listed below (see PT Problem List). Pt was able to ambulate with RW with min guard assist. Slow and steady gait with RW.  Will need RW and HH f/u.  Pt will benefit from skilled PT to increase their independence and safety with mobility to allow discharge to the venue listed below.      Follow Up Recommendations Home health PT;Supervision/Assistance - 24 hour(HHaide, HHOT)    Equipment Recommendations  Rolling walker with 5" wheels    Recommendations for Other Services       Precautions / Restrictions Precautions Precautions: Fall Restrictions Weight Bearing Restrictions: No      Mobility  Bed Mobility Overal bed mobility: Independent             General bed mobility comments: incr time needed  Transfers Overall transfer level: Needs assistance Equipment used: Rolling walker (2 wheeled) Transfers: Sit to/from Stand Sit to Stand: Min guard         General transfer comment: With incr time can sit to stand without assist. Transferred to 3N1 as pt said she needed to urinate and have a BM.  Able to transfer to 3N1 with just a little minguard assist.   Ambulation/Gait Ambulation/Gait assistance: Min guard Gait Distance (Feet): 270 Feet Assistive device: Rolling walker (2 wheeled) Gait Pattern/deviations: Step-through pattern;Decreased stride length   Gait velocity interpretation: 1.31 - 2.62 ft/sec, indicative of limited community ambulator General Gait Details: Pt was able to ambulate with RW with min guard assist. Pt No LOB and steady with RW.  Slow and steady gait.    Stairs            Wheelchair Mobility     Modified Rankin (Stroke Patients Only)       Balance Overall balance assessment: Needs assistance Sitting-balance support: No upper extremity supported;Feet supported Sitting balance-Leahy Scale: Fair     Standing balance support: Bilateral upper extremity supported;During functional activity Standing balance-Leahy Scale: Poor Standing balance comment: relies on UE support                             Pertinent Vitals/Pain Pain Assessment: Faces Faces Pain Scale: Hurts little more Pain Location: headache and LE pain Pain Descriptors / Indicators: Aching;Grimacing;Guarding Pain Intervention(s): Limited activity within patient's tolerance;Monitored during session;Repositioned;Patient requesting pain meds-RN notified    Home Living Family/patient expects to be discharged to:: Private residence Living Arrangements: Alone Available Help at Discharge: Available PRN/intermittently;Family;Friend(s) Type of Home: House Home Access: Stairs to enter Entrance Stairs-Rails: Left Entrance Stairs-Number of Steps: 6-7 Home Layout: One level Home Equipment: Toilet riser;Shower seat      Prior Function Level of Independence: Independent               Hand Dominance   Dominant Hand: Right    Extremity/Trunk Assessment   Upper Extremity Assessment Upper Extremity Assessment: Defer to OT evaluation    Lower Extremity Assessment Lower Extremity Assessment: Overall WFL for tasks assessed    Cervical / Trunk Assessment Cervical / Trunk Assessment: Normal  Communication   Communication: No difficulties  Cognition Arousal/Alertness: Awake/alert Behavior During Therapy: WFL for  tasks assessed/performed Overall Cognitive Status: Within Functional Limits for tasks assessed                                        General Comments      Exercises General Exercises - Lower Extremity Ankle Circles/Pumps: AROM;Both;10 reps;Seated Long Arc Quad:  AROM;Both;10 reps;Seated   Assessment/Plan    PT Assessment Patient needs continued PT services  PT Problem List Decreased activity tolerance;Decreased balance;Decreased mobility;Decreased knowledge of use of DME;Decreased safety awareness;Decreased knowledge of precautions;Cardiopulmonary status limiting activity       PT Treatment Interventions DME instruction;Gait training;Therapeutic activities;Therapeutic exercise;Functional mobility training;Balance training;Patient/family education    PT Goals (Current goals can be found in the Care Plan section)  Acute Rehab PT Goals Patient Stated Goal: to go home PT Goal Formulation: With patient Time For Goal Achievement: 04/16/19 Potential to Achieve Goals: Good    Frequency Min 3X/week   Barriers to discharge        Co-evaluation               AM-PAC PT "6 Clicks" Mobility  Outcome Measure Help needed turning from your back to your side while in a flat bed without using bedrails?: None Help needed moving from lying on your back to sitting on the side of a flat bed without using bedrails?: None Help needed moving to and from a bed to a chair (including a wheelchair)?: A Little Help needed standing up from a chair using your arms (e.g., wheelchair or bedside chair)?: A Little Help needed to walk in hospital room?: A Little Help needed climbing 3-5 steps with a railing? : A Little 6 Click Score: 20    End of Session Equipment Utilized During Treatment: Gait belt;Oxygen Activity Tolerance: Patient limited by fatigue Patient left: in chair;with call bell/phone within reach;with chair alarm set Nurse Communication: Mobility status PT Visit Diagnosis: Muscle weakness (generalized) (M62.81)    Time: 2979-8921 PT Time Calculation (min) (ACUTE ONLY): 40 min   Charges:   PT Evaluation $PT Eval Moderate Complexity: 1 Mod PT Treatments $Gait Training: 23-37 mins        [place] Pager:   [phone]  Office:  [phone]    Denice Paradise 04/02/2019, 1:11 PM

## 2019-04-02 NOTE — Progress Notes (Signed)
[place] Progress Note Patient Name: Jane [name] DOB: February 18, 1966 MRN: [id number]   Date of Service  04/02/2019  HPI/Events of Note  Notified of H/H 6.9/ 21.7. No active bleeding and hemodynamically stable  eICU Interventions  Will continue to monitor     Intervention Category Intermediate Interventions: Other:  Judd Lien 04/02/2019, 5:05 AM

## 2019-04-02 NOTE — Progress Notes (Addendum)
  Progress Note    04/02/2019 9:17 AM 3 Days Post-Op  Subjective:  No new complaints.  Patient walked a little yesterday and is using bedside commode.  She has some back pain but attributes this to being in bed.   Vitals:   04/02/19 0739 04/02/19 0800  BP:    Pulse:  92  Resp:  17  Temp: 98.5 F (36.9 C)   SpO2:  99%   Physical Exam: Lungs:  Non labored Incisions:  L groin incision c/d/i Extremities:  LLE edematous, tight, unchanged; sore starting mid posterior thigh and over achilles Neurologic: A&O  CBC    Component Value Date/Time   WBC 13.8 (H) 04/02/2019 0326   RBC 2.39 (L) 04/02/2019 0326   HGB 6.9 (LL) 04/02/2019 0326   HCT 21.7 (L) 04/02/2019 0326   PLT 131 (L) 04/02/2019 0326   MCV 90.8 04/02/2019 0326   MCH 28.9 04/02/2019 0326   MCHC 31.8 04/02/2019 0326   RDW 16.2 (H) 04/02/2019 0326    BMET    Component Value Date/Time   NA 139 04/01/2019 0435   K 3.8 04/01/2019 0435   CL 104 04/01/2019 0435   CO2 25 04/01/2019 0435   GLUCOSE 128 (H) 04/01/2019 0435   BUN 34 (H) 04/01/2019 0435   CREATININE 1.48 (H) 04/01/2019 0435   CALCIUM 7.9 (L) 04/01/2019 0435   GFRNONAA 40 (L) 04/01/2019 0435   GFRAA 47 (L) 04/01/2019 0435    INR    Component Value Date/Time   INR 1.3 (H) 03/30/2019 1514     Intake/Output Summary (Last 24 hours) at 04/02/2019 5284 Last data filed at 04/02/2019 0800 Gross per 24 hour  Intake 1244.33 ml  Output 1450 ml  Net -205.67 ml     Assessment/Plan:  53 y.o. female is s/p patch L CFV and stenting of L common and external iliac veins with blood loss requiring large volume resuscitation 3 Days Post-Op   Sores starting L posterior thigh and ankle; will need barrier in addition to continued compression Continue IV heparin Hgb slow drift, now 6.9; will likely need 1u PRBCs Transfer to 4E when bed available   Dagoberto Ligas, PA-C [facility] [phone] 04/02/2019 9:17 AM  I agree with the above.  I  have seen and examined the patient.  She could not tolerate compression stockings, and so I will have UNNA boots placed on left leg.   --acute blood loss anemia:  Hb dropped today, will transfuse 1 unit pRBC --Continue IV heparin.  Will need conversion back to Xarelto once Hb stabilizes --Transfer to floor  [employer]

## 2019-04-02 NOTE — Progress Notes (Signed)
Orthopedic Tech Progress Note Patient Details:  Jane [name] Jul 03, 1966 [id number]  Ortho Devices Type of Ortho Device: Haematologist Ortho Device/Splint Location: LLE Ortho Device/Splint Interventions: Adjustment, Application, Ordered   Post Interventions Patient Tolerated: Well Instructions Provided: Adjustment of device, Care of device   [name] 04/02/2019, 1:42 PM

## 2019-04-03 LAB — BPAM RBC
Blood Product Expiration Date: 202006232359
Blood Product Expiration Date: 202006232359
Blood Product Expiration Date: 202006232359
Blood Product Expiration Date: 202006232359
Blood Product Expiration Date: 202006232359
Blood Product Expiration Date: 202006232359
ISSUE DATE / TIME: 202005260932
ISSUE DATE / TIME: 202005260932
ISSUE DATE / TIME: 202005261039
ISSUE DATE / TIME: 202005261039
ISSUE DATE / TIME: 202005261303
ISSUE DATE / TIME: 202005291606
Unit Type and Rh: 5100
Unit Type and Rh: 5100
Unit Type and Rh: 5100
Unit Type and Rh: 5100
Unit Type and Rh: 5100
Unit Type and Rh: 5100

## 2019-04-03 LAB — TYPE AND SCREEN
ABO/RH(D): O POS
Antibody Screen: NEGATIVE
Unit division: 0
Unit division: 0
Unit division: 0
Unit division: 0
Unit division: 0
Unit division: 0

## 2019-04-03 LAB — CBC
HCT: 24.6 % — ABNORMAL LOW (ref 36.0–46.0)
Hemoglobin: 7.8 g/dL — ABNORMAL LOW (ref 12.0–15.0)
MCH: 28.1 pg (ref 26.0–34.0)
MCHC: 31.7 g/dL (ref 30.0–36.0)
MCV: 88.5 fL (ref 80.0–100.0)
Platelets: 142 10*3/uL — ABNORMAL LOW (ref 150–400)
RBC: 2.78 MIL/uL — ABNORMAL LOW (ref 3.87–5.11)
RDW: 15.7 % — ABNORMAL HIGH (ref 11.5–15.5)
WBC: 11.7 10*3/uL — ABNORMAL HIGH (ref 4.0–10.5)
nRBC: 0.5 % — ABNORMAL HIGH (ref 0.0–0.2)

## 2019-04-03 LAB — GLUCOSE, CAPILLARY
Glucose-Capillary: 101 mg/dL — ABNORMAL HIGH (ref 70–99)
Glucose-Capillary: 107 mg/dL — ABNORMAL HIGH (ref 70–99)
Glucose-Capillary: 116 mg/dL — ABNORMAL HIGH (ref 70–99)
Glucose-Capillary: 116 mg/dL — ABNORMAL HIGH (ref 70–99)
Glucose-Capillary: 63 mg/dL — ABNORMAL LOW (ref 70–99)
Glucose-Capillary: 69 mg/dL — ABNORMAL LOW (ref 70–99)
Glucose-Capillary: 82 mg/dL (ref 70–99)
Glucose-Capillary: 85 mg/dL (ref 70–99)
Glucose-Capillary: 95 mg/dL (ref 70–99)

## 2019-04-03 LAB — HEPARIN LEVEL (UNFRACTIONATED): Heparin Unfractionated: 0.53 IU/mL (ref 0.30–0.70)

## 2019-04-03 LAB — MAGNESIUM: Magnesium: 2.2 mg/dL (ref 1.7–2.4)

## 2019-04-03 LAB — PHOSPHORUS: Phosphorus: 3.9 mg/dL (ref 2.5–4.6)

## 2019-04-03 MED ORDER — AMPHETAMINE-DEXTROAMPHETAMINE 10 MG PO TABS
30.0000 mg | ORAL_TABLET | Freq: Two times a day (BID) | ORAL | Status: DC
  Administered 2019-04-03 – 2019-04-05 (×5): 30 mg via ORAL
  Filled 2019-04-03 (×5): qty 3

## 2019-04-03 MED ORDER — INSULIN ASPART 100 UNIT/ML ~~LOC~~ SOLN: 0.0000 [IU] | Freq: Three times a day (TID) | SUBCUTANEOUS | Status: DC

## 2019-04-03 NOTE — TOC Initial Note (Signed)
Transition of Care [facility]) - Initial/Assessment Note    Patient Details  Name: Jane [name] MRN: [id number] Date of Birth: 05/25/1966  [facility] CM/SW Contact:    Claudie Leach, RN Phone Number: 04/03/2019, 5:13 PM  Clinical Narrative:                 Patient from home alone.  Pt states she has 2 adult daughters who live nearby but are "busy".  They are able to provide transportation with notice and fulfill some other needs but are not able to stay with patient.  Patient uses [employer] transportation or "gets a ride".  She says she knows of transportation resources she can use if needed.  Patient uses [employer], which delivers.  Patient frequents her PCP office and can go to an appointment within 1 week of discharge.    Patient states she has had increasing difficulty performing ADLs and needs a personal care assistant.  She is in the process of applying for one with Medicaid and has a phone interview on Monday.  The patient states they advised they could set up an emergency aide if needed.    The patient refuses SNF at this time, stating she needs to take care of her dog until her daughters could take him.  She states "I can't go back home and do it myself".  When asked what she will want if unable to set up aide, she states "I'll figure out something but I'm better off at home."    She has used home health PT and RN in the last 6 months and believes it was [place].  She endorses needing RW and 3n1 at home.  She has a shower seat and an elevated toilet seat.    Expected Discharge Plan: [place] Barriers to Discharge: Unsafe home situation   Patient Goals and CMS Choice Patient states their goals for this hospitalization and ongoing recovery are:: to get back home with my dog CMS Medicare.gov Compare Post Acute Care list provided to:: Patient Choice offered to / list presented to : Patient  Expected Discharge Plan and  Services Expected Discharge Plan: [place]   Discharge Planning Services: CM Consult Post Acute Care Choice: Durable Medical Equipment, Home Health Living arrangements for the past 2 months: Apartment                 DME Arranged: 3-N-1, Walker rolling DME Agency: [employer] Date DME Agency Contacted: 04/03/19 Time DME Agency Contacted: 1300 Representative spoke with at DME Agency: [place]: RN, PT, Nurse's Aide [place] Agency: [place] ([employer]) Date [place]: 04/03/19 Time [place]: 1430 Representative spoke with at [place]: Floydene Flock  Prior Living Arrangements/Services Living arrangements for the past 2 months: Apartment Lives with:: Self, Pets   Do you feel safe going back to the place where you live?: Yes      Need for Family Participation in Patient Care: Yes (Comment) Care giver support system in place?: Yes (comment) Current home services: DME Criminal Activity/Legal Involvement Pertinent to Current Situation/Hospitalization: Yes - Comment as needed  Activities of Daily Living Home Assistive Devices/Equipment: Shower chair without back, Raised toilet seat with rails, Other (Comment), Blood pressure cuff, CBG Meter, Eyeglasses ADL Screening (condition at time of admission) Patient's cognitive ability adequate to safely complete daily activities?: Yes Is the patient deaf or have difficulty hearing?: No Does the patient  have difficulty seeing, even when wearing glasses/contacts?: No Does the patient have difficulty concentrating, remembering, or making decisions?: No Patient able to express need for assistance with ADLs?: Yes Does the patient have difficulty dressing or bathing?: No Independently performs ADLs?: No Does the patient have difficulty walking or climbing stairs?: Yes Weakness of Legs: Both Weakness of Arms/Hands: Both   Emotional Assessment Appearance:: Appears older than stated  age Attitude/Demeanor/Rapport: Gracious, Engaged Affect (typically observed): Accepting Orientation: : Oriented to Self, Oriented to Place, Oriented to  Time, Oriented to Situation Alcohol / Substance Use: Not Applicable Psych Involvement: No (comment)  Admission diagnosis:  Dvt femoral (deep venous thrombosis) ([place]) [I82.419] Patient Active Problem List   Diagnosis Date Noted  . Pressure injury of skin 04/02/2019  . Respiratory failure requiring intubation ([place])   . Dvt femoral (deep venous thrombosis) ([place]) 03/30/2019  . 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 Chronic venous insufficiency 11/21/2017  . Radicular low back pain 05/25/2014  . Depression, major, recurrent ([place]) 06/17/2013  . OCD (obsessive compulsive disorder) 11/20/2012  . 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 Bipolar 1 disorder ([place]) 09/25/2012  . DVT (deep venous thrombosis) ([place]) 08/18/2012   PCP:  Sinda Du, MD Pharmacy:   [facility], [place] [address] [place] [address] Phone: [phone] Fax: [phone]        Readmission Risk Interventions Readmission Risk Prevention Plan 04/03/2019  Transportation Screening Complete  PCP or Specialist Appt within 3-5 Days Complete  HRI or Home Care Consult Complete  Social Work Consult for [place] Planning/Counseling Not Complete  SW consult not completed comments NA  Palliative Care Screening Not Applicable  Medication Review (RN Care Manager) Referral to Pharmacy  Some recent data might be hidden

## 2019-04-03 NOTE — Progress Notes (Signed)
Hypoglycemic Event  CBG: 63 at 1718  Treatment: PO orange juice 8oz  Symptoms: None  Follow-up CBG: Time:1752 CBG Result:69  Treatment: 1 can of gingerale 8oz  Symptoms: None  Follow-up CBG: Time: 107 at 1812  Possible Reasons for Event: Inadequate food intake/disease process    Sanjay Broadfoot L

## 2019-04-03 NOTE — Progress Notes (Signed)
CBG changed to ACHS per verbal order from [place], [place] will continue to monitor.

## 2019-04-03 NOTE — Plan of Care (Signed)
  Problem: Health Behavior/Discharge Planning: Goal: Ability to manage health-related needs will improve Outcome: Progressing   Problem: Clinical Measurements: Goal: Ability to maintain clinical measurements within normal limits will improve Outcome: Progressing   

## 2019-04-03 NOTE — Progress Notes (Addendum)
  Progress Note    04/03/2019 8:57 AM 4 Days Post-Op  Subjective:  C/o pain in her left leg  Afebrile VSS  Vitals:   04/03/19 0429 04/03/19 0836  BP: (!) 128/53 (!) 100/49  Pulse: 77 77  Resp: 18 18  Temp: 98.4 F (36.9 C) 98.3 F (36.8 C)  SpO2: 92% 93%    Physical Exam: Cardiac:  regular Lungs:  Non labored Incisions:  Left groin incision is clean; moist guaze in place Extremities:  Una boot in place LLE   CBC    Component Value Date/Time   WBC 11.7 (H) 04/03/2019 0704   RBC 2.78 (L) 04/03/2019 0704   HGB 7.8 (L) 04/03/2019 0704   HCT 24.6 (L) 04/03/2019 0704   PLT 142 (L) 04/03/2019 0704   MCV 88.5 04/03/2019 0704   MCH 28.1 04/03/2019 0704   MCHC 31.7 04/03/2019 0704   RDW 15.7 (H) 04/03/2019 0704    BMET    Component Value Date/Time   NA 139 04/01/2019 0435   K 3.8 04/01/2019 0435   CL 104 04/01/2019 0435   CO2 25 04/01/2019 0435   GLUCOSE 128 (H) 04/01/2019 0435   BUN 34 (H) 04/01/2019 0435   CREATININE 1.48 (H) 04/01/2019 0435   CALCIUM 7.9 (L) 04/01/2019 0435   GFRNONAA 40 (L) 04/01/2019 0435   GFRAA 47 (L) 04/01/2019 0435    INR    Component Value Date/Time   INR 1.3 (H) 03/30/2019 1514     Intake/Output Summary (Last 24 hours) at 04/03/2019 0857 Last data filed at 04/03/2019 0839 Gross per 24 hour  Intake 973.11 ml  Output 1250 ml  Net -276.89 ml     Assessment:  53 y.o. female is s/p:  1.  Exploration left common femoral artery and vein greater than 30 days 2.  Patch angioplasty left common femoral vein 3.  Ultrasound-guided cannulation right internal jugular vein 4.  Stent of left common, external iliac veins and left common femoral vein with 13 x 10 cm via bond distally extended with 13 x 5 cm Biobond into the external iliac vein and centrally placed 14 x 17mm Vici stent  4 Days Post-Op  Plan: -did not tolerate compression sock and una boot now in place, which she is tolerating much better.   -acute blood loss anemia-hgb  improved to 7.8 from 6.9 after transfusion of one PRBC yesterday.   -left groin incision looks ok.  Continue to place dry gauze to left groin to wick moisture to help prevent wound breakdown.  Discussed with pt and placed box of gauze at bedside. -will continue heparin for another day and check CBC in am, if hgb remains stable, will transition back to po [facility] -continue to mobilize and OOB -will need CM for [facility] needs-will order today   Leontine Locket, PA-C [facility] [phone] 04/03/2019 8:57 AM  I have seen and evaluated the patient. I agree with the PA note as documented above. Unna boot to left leg.  Left groin dry.  Responded to 1 upRBC.  Continue heparin today.  Will check CBC tomorrow and if stable will convert to PO anticoagulation.  Work on mobility.    Marty Heck, MD [facility] [place] Office: [phone] Pager: [phone]

## 2019-04-03 NOTE — Progress Notes (Signed)
ANTICOAGULATION CONSULT NOTE - [place] for Heparin Indication: DVT  Allergies  Allergen Reactions  . Aripiprazole Other (See Comments)    BECOMES  VIOLENT   . Seroquel [Quetiapine Fumarate] Other (See Comments)    BECOMES VIOLENT  . Chlorpromazine Other (See Comments)    SEVERE ANXIETY   . Gabapentin Other (See Comments)    NIGHTMARES     Patient Measurements: Height: 5\' 10"  (177.8 cm) Weight: 295 lb 9.6 oz (134.1 kg) IBW/kg (Calculated) : 68.5 Heparin Dosing Weight: 100.2  Vital Signs: Temp: 98.3 F (36.8 C) (05/30 0836) Temp Source: Oral (05/30 0836) BP: 100/49 (05/30 0836) Pulse Rate: 77 (05/30 0836)  Labs: Recent Labs    03/31/19 1340  04/01/19 0435  04/01/19 1700 04/02/19 0326 04/02/19 0942 04/03/19 0335 04/03/19 0704  HGB 8.0*  --  7.1*  --   --  6.9*  --   --  7.8*  HCT 25.4*  --  22.4*  --   --  21.7*  --   --  24.6*  PLT 122*  --  126*  --   --  131*  --   --  142*  APTT  --    < >  --    < > 64* 71* 93*  --   --   HEPARINUNFRC  --    < > 0.71*  --   --  0.63  --  0.53  --   CREATININE 1.63*  --  1.48*  --   --   --   --   --   --    < > = values in this interval not displayed.    Estimated Creatinine Clearance: 66.5 mL/min (A) (by C-G formula based on SCr of 1.48 mg/dL (H)).   Medical History: Past Medical History:  Diagnosis Date  . Anxiety   . Bipolar disorder ([place])   . Chronic kidney disease    Stage 3 kidney disease;dx by Dr. Sinda Du.   . Constipation   . Depression   . Diabetes mellitus without complication ([place])   . Dyspnea    with exertion  . GERD (gastroesophageal reflux disease)   . Headache    migraines  . High cholesterol   . History of DVT (deep vein thrombosis)    left leg  . Hypertension    states under control with meds., has been on med. x 2 years  . Hypothyroidism   . Obsessive-compulsive disorder   . Peripheral vascular disease ([place])   . Restless leg syndrome   . Trigger thumb  of left hand 01/2018  . Trigger thumb of right hand     Assessment: 53 YO F s/p patch L CFV and stenting of L common and external iliac veins with blood loss requiring large volume resuscitation. Now post-op with blood loss s/p PRBC 5/29 h/h improved. Was on Xarelto PTA with last dose taken 5/25 at 1730.  Heparin level 0.5 at goal on heparin drip 2150 uts/hr  No overt bleeding noted. PLTC stable   Goal of Therapy:  Heparin level 0.3-0.7 units/ml aPTT 66-102 seconds Monitor platelets by anticoagulation protocol: Yes   Plan:  - Continue heparin Heparin drip at 2150 units/hr (21.5 ml/hr) - Will continue to monitor for any signs/symptoms of bleeding   -possible back to oral DOAC 6/1 if h/h remains stable   Bonnita Nasuti Pharm.D. CPP, BCPS Clinical Pharmacist [phone] 04/03/2019 9:15 AM

## 2019-04-04 LAB — GLUCOSE, CAPILLARY
Glucose-Capillary: 120 mg/dL — ABNORMAL HIGH (ref 70–99)
Glucose-Capillary: 122 mg/dL — ABNORMAL HIGH (ref 70–99)
Glucose-Capillary: 81 mg/dL (ref 70–99)
Glucose-Capillary: 90 mg/dL (ref 70–99)
Glucose-Capillary: 92 mg/dL (ref 70–99)

## 2019-04-04 LAB — CBC
HCT: 23.3 % — ABNORMAL LOW (ref 36.0–46.0)
Hemoglobin: 7.3 g/dL — ABNORMAL LOW (ref 12.0–15.0)
MCH: 28.1 pg (ref 26.0–34.0)
MCHC: 31.3 g/dL (ref 30.0–36.0)
MCV: 89.6 fL (ref 80.0–100.0)
Platelets: 163 10*3/uL (ref 150–400)
RBC: 2.6 MIL/uL — ABNORMAL LOW (ref 3.87–5.11)
RDW: 15.9 % — ABNORMAL HIGH (ref 11.5–15.5)
WBC: 10.9 10*3/uL — ABNORMAL HIGH (ref 4.0–10.5)
nRBC: 0.7 % — ABNORMAL HIGH (ref 0.0–0.2)

## 2019-04-04 LAB — MAGNESIUM: Magnesium: 2.2 mg/dL (ref 1.7–2.4)

## 2019-04-04 LAB — HEPARIN LEVEL (UNFRACTIONATED): Heparin Unfractionated: 0.56 IU/mL (ref 0.30–0.70)

## 2019-04-04 LAB — PHOSPHORUS: Phosphorus: 4 mg/dL (ref 2.5–4.6)

## 2019-04-04 MED ORDER — CEPHALEXIN 500 MG PO CAPS: 500.0000 mg | ORAL_CAPSULE | Freq: Two times a day (BID) | ORAL | Status: DC

## 2019-04-04 MED ORDER — HEPARIN (PORCINE) 25000 UT/250ML-% IV SOLN: 2150.0000 [IU]/h | INTRAVENOUS | Status: AC

## 2019-04-04 MED ORDER — RIVAROXABAN 20 MG PO TABS
20.0000 mg | ORAL_TABLET | Freq: Every day | ORAL | Status: DC
  Administered 2019-04-04: 20 mg via ORAL
  Filled 2019-04-04: qty 1

## 2019-04-04 MED ORDER — CEPHALEXIN 500 MG PO CAPS
500.0000 mg | ORAL_CAPSULE | Freq: Three times a day (TID) | ORAL | Status: DC
  Administered 2019-04-04 – 2019-04-05 (×2): 500 mg via ORAL
  Filled 2019-04-04 (×2): qty 1

## 2019-04-04 NOTE — Progress Notes (Addendum)
  Progress Note    04/04/2019 7:27 AM 5 Days Post-Op  Subjective:  Says her sugar was low  Afebrile VSS  Vitals:   04/04/19 0345 04/04/19 0710  BP: 114/75 (!) 118/49  Pulse: 87 80  Resp: 17 20  Temp: 98.3 F (36.8 C) 98.4 F (36.9 C)  SpO2: 96% 97%    Physical Exam: General:  No distress Lungs:  Non labored Incisions:  Left groin continues to look ok Extremities:  Una boot left leg.   CBC    Component Value Date/Time   WBC 10.9 (H) 04/04/2019 0502   RBC 2.60 (L) 04/04/2019 0502   HGB 7.3 (L) 04/04/2019 0502   HCT 23.3 (L) 04/04/2019 0502   PLT 163 04/04/2019 0502   MCV 89.6 04/04/2019 0502   MCH 28.1 04/04/2019 0502   MCHC 31.3 04/04/2019 0502   RDW 15.9 (H) 04/04/2019 0502    BMET    Component Value Date/Time   NA 139 04/01/2019 0435   K 3.8 04/01/2019 0435   CL 104 04/01/2019 0435   CO2 25 04/01/2019 0435   GLUCOSE 128 (H) 04/01/2019 0435   BUN 34 (H) 04/01/2019 0435   CREATININE 1.48 (H) 04/01/2019 0435   CALCIUM 7.9 (L) 04/01/2019 0435   GFRNONAA 40 (L) 04/01/2019 0435   GFRAA 47 (L) 04/01/2019 0435    INR    Component Value Date/Time   INR 1.3 (H) 03/30/2019 1514     Intake/Output Summary (Last 24 hours) at 04/04/2019 0727 Last data filed at 04/04/2019 0711 Gross per 24 hour  Intake 992.74 ml  Output 3425 ml  Net -2432.26 ml     Assessment:  53 y.o. female is s/p:  1.Exploration left common femoral artery and vein greater than 30 days 2.Patch angioplasty left common femoral vein 3.Ultrasound-guided cannulation right internal jugular vein 4.Stent of left common,external iliac veins and left common femoral vein with 13 x 10 cm via bond distally extended with 13 x 5 cm Biobond into the external iliac vein and centrally placed 14 x 122mmVici stent   5 Days Post-Op  Plan: -pt in una boot left leg -on heparin gtt-will transition to po today and check CBC tomorrow -acute blood loss anemia-hgb drifted down to 7.3 from 7.8.  Pt  tolerating.  Will check cbc tomorrow.  If stable, will discharge home.  Pt's HH issues are arranged.  -continue mobilization. -hypoglycemia:  Will stop SSI and use only Glipizide (home med).  She has not required any insulin >48hrs.   -continue dry gauze left groin to wick moisture.   Leontine Locket, PA-C [facility] [phone] 04/04/2019 7:27 AM  I have seen and evaluated the patient. I agree with the PA note as documented above. Left leg in unna boot.  Hgb 7.8 --> 7.3, slight downtrend, will d/c heparin, restart Xarleto.  Arrange home health PT/OT.  Recheck Hgb again tomorrow.  Hopefully home tomorrow.  Marty Heck, MD [facility] [place] Office: [phone] Pager: [phone]

## 2019-04-04 NOTE — Progress Notes (Addendum)
Patient c/o of increased pain and more drainage on her left groin incision. Upon assessment her left groin incision was swollen ,moderate amount of serosanguinous drainage was noted.Dry gauze was applied as ordered. Stann Mainland and Carlis Abbott MD notified. Verbal order received from Wilmer Floor MD for keflex 500mg  PO TID. Will continue to monitor.

## 2019-04-04 NOTE — Progress Notes (Addendum)
ANTICOAGULATION CONSULT NOTE - [place] for Heparin Indication: DVT  Allergies  Allergen Reactions  . Aripiprazole Other (See Comments)    BECOMES  VIOLENT   . Seroquel [Quetiapine Fumarate] Other (See Comments)    BECOMES VIOLENT  . Chlorpromazine Other (See Comments)    SEVERE ANXIETY   . Gabapentin Other (See Comments)    NIGHTMARES     Patient Measurements: Height: 5\' 10"  (177.8 cm) Weight: 295 lb 9.6 oz (134.1 kg) IBW/kg (Calculated) : 68.5 Heparin Dosing Weight: 100.2  Vital Signs: Temp: 98.4 F (36.9 C) (05/31 0710) Temp Source: Oral (05/31 0710) BP: 118/49 (05/31 0710) Pulse Rate: 80 (05/31 0710)  Labs: Recent Labs    04/01/19 1700  04/02/19 0326 04/02/19 0942 04/03/19 0335 04/03/19 0704 04/04/19 0502  HGB  --    < > 6.9*  --   --  7.8* 7.3*  HCT  --   --  21.7*  --   --  24.6* 23.3*  PLT  --   --  131*  --   --  142* 163  APTT 64*  --  71* 93*  --   --   --   HEPARINUNFRC  --   --  0.63  --  0.53  --  0.56   < > = values in this interval not displayed.    Estimated Creatinine Clearance: 66.5 mL/min (A) (by C-G formula based on SCr of 1.48 mg/dL (H)).   Medical History: Past Medical History:  Diagnosis Date  . Anxiety   . Bipolar disorder ([place])   . Chronic kidney disease    Stage 3 kidney disease;dx by Dr. Sinda Du.   . Constipation   . Depression   . Diabetes mellitus without complication ([place])   . Dyspnea    with exertion  . GERD (gastroesophageal reflux disease)   . Headache    migraines  . High cholesterol   . History of DVT (deep vein thrombosis)    left leg  . Hypertension    states under control with meds., has been on med. x 2 years  . Hypothyroidism   . Obsessive-compulsive disorder   . Peripheral vascular disease ([place])   . Restless leg syndrome   . Trigger thumb of left hand 01/2018  . Trigger thumb of right hand     Assessment: 53 YO F s/p patch L CFV and stenting of L common and  external iliac veins with blood loss requiring large volume resuscitation. Now post-op with blood loss s/p PRBC 5/29 h/h improved. Was on Xarelto PTA with last dose taken 5/25 at 1730.  Was converted to IV heparin upon admission.  No overt bleeding noted. PLTC stable.  Pharmacy asked to change back to Xarelto today.  Discussed with Dr. Carlis Abbott, since clot more of chronic nature, and bypass presumably now open after stenting, patient should be able to resume Xarelto maintenance dosing.  Goal of Therapy:  Heparin level 0.3-0.7 units/ml aPTT 66-102 seconds Monitor platelets by anticoagulation protocol: Yes   Plan:  Patient prefers to take Xarelto at dinner time, so will continue IV heparin until 1700 PM, and then start Xarelto 20 mg daily at that time.  Marguerite Olea, [facility] Clinical Pharmacist Phone [phone]  04/04/2019 8:11 AM

## 2019-04-04 NOTE — Progress Notes (Signed)
Orthopedic Tech Progress Note Patient Details:  Jane [name] 09-Mar-1966 [id number]  Patient ID: [name], female   DOB: [birth year], 53 y.o.   MRN: [id number]   [name] 04/04/2019, 2:14 PMLeft unna boot

## 2019-04-04 NOTE — Progress Notes (Signed)
Orthopedic Tech Progress Note Patient Details:  Jane [name] January 07, 1966 [id number]  Ortho Devices Type of Ortho Device: Ace wrap, Unna boot Ortho Device/Splint Location: Left unna boot Ortho Device/Splint Interventions: Application   Post Interventions Patient Tolerated: Well Instructions Provided: Care of device   [name] 04/04/2019, 2:16 PM

## 2019-04-04 NOTE — Progress Notes (Signed)
Orthopedic Tech Progress Note Patient Details:  Jane [name] 1966/04/08 [id number]  Ortho Devices Type of Ortho Device: Ace wrap, Unna boot Ortho Device/Splint Location: Bilateral unna boots Ortho Device/Splint Interventions: Application   Post Interventions Patient Tolerated: Well Instructions Provided: Care of device   [name] 04/04/2019, 2:14 PM

## 2019-04-05 LAB — CBC
HCT: 24.4 % — ABNORMAL LOW (ref 36.0–46.0)
Hemoglobin: 7.7 g/dL — ABNORMAL LOW (ref 12.0–15.0)
MCH: 28.3 pg (ref 26.0–34.0)
MCHC: 31.6 g/dL (ref 30.0–36.0)
MCV: 89.7 fL (ref 80.0–100.0)
Platelets: 168 K/uL (ref 150–400)
RBC: 2.72 MIL/uL — ABNORMAL LOW (ref 3.87–5.11)
RDW: 15.7 % — ABNORMAL HIGH (ref 11.5–15.5)
WBC: 9.1 K/uL (ref 4.0–10.5)
nRBC: 0.3 % — ABNORMAL HIGH (ref 0.0–0.2)

## 2019-04-05 LAB — GLUCOSE, CAPILLARY
Glucose-Capillary: 134 mg/dL — ABNORMAL HIGH (ref 70–99)
Glucose-Capillary: 151 mg/dL — ABNORMAL HIGH (ref 70–99)

## 2019-04-05 MED ORDER — OXYCODONE-ACETAMINOPHEN 5-325 MG PO TABS: 1.0000 | ORAL_TABLET | Freq: Four times a day (QID) | ORAL | 0 refills | Status: DC | PRN

## 2019-04-05 MED ORDER — CEPHALEXIN 500 MG PO CAPS: 500.0000 mg | ORAL_CAPSULE | Freq: Three times a day (TID) | ORAL | 0 refills | Status: DC

## 2019-04-05 MED FILL — OXYCODONE W/APAP 5/325 TAB: 5-325 | 4 days supply | Qty: 15 | Fill #0

## 2019-04-05 MED FILL — CEPHALEXIN 500 MG CAPSULE: 500 | 8 days supply | Qty: 24 | Fill #0

## 2019-04-05 NOTE — TOC Transition Note (Signed)
Transition of Care [facility]) - CM/SW Discharge Note Marvetta Gibbons RN, BSN Transitions of Care Unit 4E- RN Case Manager [phone]   Patient Details  Name: Jane [name] MRN: [id number] Date of Birth: 1965-11-05  Transition of Care [facility]) CM/SW Contact:  Dawayne Patricia, RN Phone Number: 04/05/2019, 11:04 AM   Clinical Narrative:    Pt admitted s/p exploration of left fem. Artery and patch angioplasty with stenting. Pt stable for transition home today. DME 3n1 and RW have been delivered to bedside by [facility] for home. CM was notified by Tiffany with [facility] that they has received referral from vascular office- noted pt had chosen [facility] for [facility] agency- CM spoke with pt at bedside to confirm agency of choice- pt states she had used [facility] back the first of the year but wants to use [facility] this time. Have let Tiffany with [facility] know of pt's choice. Butch Penny with [facility] notified of pt's discharge to home today. Pt also reports that she has a telephonic interview for [facility] services this afternoon.    Final next level of care: [place] Barriers to Discharge: No Barriers Identified   Patient Goals and CMS Choice Patient states their goals for this hospitalization and ongoing recovery are:: to get back home with my dog CMS Medicare.gov Compare Post Acute Care list provided to:: Patient Choice offered to / list presented to : Patient  Discharge Placement  Home with [facility] services                     Discharge Plan and Services   Discharge Planning Services: CM Consult Post Acute Care Choice: Durable Medical Equipment, Home Health          DME Arranged: 3-N-1, Walker rolling DME Agency: [employer] Date DME Agency Contacted: 04/03/19 Time DME Agency Contacted: 1300 Representative spoke with at DME Agency: [place]: RN, PT, Nurse's Aide [place] Agency: [place] ([place]) Date [place]: 04/03/19 Time [place]: 1430 Representative  spoke with at [place]: [place] ([place]) Interventions     Readmission Risk Interventions Readmission Risk Prevention Plan 04/05/2019 04/03/2019  Transportation Screening - Complete  PCP or Specialist Appt within 5-7 Days Complete -  PCP or Specialist Appt within 3-5 Days - Complete  Home Care Screening Complete -  Medication Review (RN CM) Complete -  HRI or [place] - Complete  Social Work Consult for [place] Planning/Counseling - Not Complete  SW consult not completed comments - NA  Palliative Care Screening - Not Applicable  Medication Review Press photographer) - Referral to Pharmacy  Some recent data might be hidden

## 2019-04-05 NOTE — Progress Notes (Addendum)
  Progress Note    04/05/2019 7:20 AM 6 Days Post-Op  Subjective:  Anticipating discharge home today   Vitals:   04/04/19 2344 04/05/19 0333  BP: (!) 128/56 (!) 146/62  Pulse: 72 71  Resp: 15 16  Temp: 98 F (36.7 C) 97.9 F (36.6 C)  SpO2: 100% 96%   Physical Exam: Lungs:  Non labored Incisions:  L groin incision with serous drainage on dressing but no active drainage Extremities:  Moving all extremities well; unna boot LLE; edema LLE unchanged Neurologic: A&O  CBC    Component Value Date/Time   WBC 9.1 04/05/2019 0527   RBC 2.72 (L) 04/05/2019 0527   HGB 7.7 (L) 04/05/2019 0527   HCT 24.4 (L) 04/05/2019 0527   PLT 168 04/05/2019 0527   MCV 89.7 04/05/2019 0527   MCH 28.3 04/05/2019 0527   MCHC 31.6 04/05/2019 0527   RDW 15.7 (H) 04/05/2019 0527    BMET    Component Value Date/Time   NA 139 04/01/2019 0435   K 3.8 04/01/2019 0435   CL 104 04/01/2019 0435   CO2 25 04/01/2019 0435   GLUCOSE 128 (H) 04/01/2019 0435   BUN 34 (H) 04/01/2019 0435   CREATININE 1.48 (H) 04/01/2019 0435   CALCIUM 7.9 (L) 04/01/2019 0435   GFRNONAA 40 (L) 04/01/2019 0435   GFRAA 47 (L) 04/01/2019 0435    INR    Component Value Date/Time   INR 1.3 (H) 03/30/2019 1514     Intake/Output Summary (Last 24 hours) at 04/05/2019 0720 Last data filed at 04/05/2019 1443 Gross per 24 hour  Intake 1838.09 ml  Output 800 ml  Net 1038.09 ml     Assessment/Plan:  53 y.o. female is s/p  1.Exploration left common femoral artery and vein greater than 30 days 2.Patch angioplasty left common femoral vein 3.Ultrasound-guided cannulation right internal jugular vein 4.Stent of left common,external iliac veins and left common femoral vein with 13 x 10 cm via bond distally extended with 13 x 5 cm Biobond into the external iliac vein and centrally placed 14 x 180mmVici stent   6 Days Post-Op    - Unna boot placed 5/29, will arrange unna boot changes as an outpatient - Some serous  drainage on dressing L groin, continue dry dressing changes, continue keflex - Hgb trending up; continue Xarelto - Probable d/c home with [facility] today   Dagoberto Ligas, PA-C [facility] [phone] 04/05/2019 7:20 AM  I have independently interviewed and examined patient and agree with PA assessment and plan above.  She is okay for discharge home today.  We will have home health change Unna boot at home and she will follow-up on June 12 in our office.  Discharged on total of 10 days Keflex.  Xarelto has been restarted H&H is stable.  She still has some residual left lower extremity swelling although with significant improvement.  Next that would likely be left lower extremity venogram possible coil embolization of her AV fistula.  Julus Kelley C. Donzetta Matters, MD [facility] [place] Office: [phone] Pager: [phone]

## 2019-04-05 NOTE — Discharge Summary (Signed)
Physician Discharge Summary   Patient ID: Jane [name] [id number] 53 y.o. 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 is a 53y.o female who previously underwent R to L femoral to femoral vein bypass that has occluded with a AV fistula which is likely patent given LLE persistent swelling.  She underwent exploration of L CFA and CFV with vein patch angioplasty of vein and stenting of common and external iliac vein 03/30/19.  Intraoperatively she experienced blood loss requiring large volume resuscitation.  She was admitted to the ICU post operatively.  Critical care was consulted to assist with ventilator management and care involving pressor support.  POD#1 she was extubated.  During hospital stay she experienced tissue breakdown likely secondary to persistent edema LLE and was put in an [place] boot.  She was eventually bridged from IV heparin back to home dose of Xarelto.  She did require an additional unit of PRBC due to a slow Hgb drift however at the time of discharge Hgb was stable.  Patient refused SNF placement thus [facility] RN and PT were arranged.  She was also prescribed a bedside commode and rolling walker based on recommendations.  She will follow up in office 6/5 for LLE unna boot change.  She will then follow up with Dr. Donzetta Matters 04/16/19.  She was prescribed an additional 8 days of Keflex for L groin incision drainage at time of discharge.  She was also prescribed 2-3 days of narcotic pain medication for continued post operative pain control.  Discharge instructions were  reviewed with the patient and she voices her understanding.  She was discharged to home with home health in stable condition.  Consults: pulmonary/intensive care  Treatments: surgery by Dr. Donzetta Matters 03/30/19:  1.  Exploration left common femoral artery and vein greater than 30 days 2.  Patch angioplasty left common femoral vein 3.  Ultrasound-guided cannulation right internal jugular vein 4.  Stent of left common, external iliac veins and left common femoral vein with 13 x 10 cm via bond distally extended with 13 x 5 cm Biobond into the external iliac vein and centrally placed 14 x 153mm Vici stent  Discharge Exam: see progress note 04/05/19 Vitals:   04/05/19 0722 04/05/19 1210  BP: (!) 149/67 (!) 145/68  Pulse: 67 86  Resp: 13 18  Temp: 97.8 F (36.6 C)   SpO2: 96% 97%    Disposition: Discharge disposition: 01-Home or Self Care       Patient Instructions:  Allergies as of 04/05/2019      Reactions   Aripiprazole Other (See Comments)   BECOMES  VIOLENT   Seroquel [quetiapine Fumarate] Other (See Comments)   BECOMES VIOLENT   Chlorpromazine Other (See Comments)   SEVERE ANXIETY   Gabapentin Other (See Comments)   NIGHTMARES      Medication List    TAKE these medications   ALPRAZolam 1 MG tablet Commonly known as:  Xanax Take 1 tablet (1 mg total) by mouth 2 (two) times daily as needed for anxiety.   amphetamine-dextroamphetamine 30 MG tablet Commonly known as:  Adderall Take 1 tablet by mouth 2 (two) times daily. Fill  after 10/27/18   aspirin EC 81 MG tablet Take 81 mg by mouth daily.   atorvastatin 20 MG tablet Commonly known as:  LIPITOR Take 20 mg by mouth daily.   cephALEXin 500 MG capsule Commonly known as:  KEFLEX Take 1 capsule (500 mg total) by mouth 3 (three) times daily.   cetirizine 10 MG tablet Commonly known as:  ZYRTEC Take 10 mg by mouth daily.   cycloSPORINE 0.05 % ophthalmic emulsion Commonly known as:  RESTASIS Place 1 drop into both eyes  2 (two) times daily.   DULoxetine 60 MG capsule Commonly known as:  Cymbalta Take 1 capsule (60 mg total) by mouth 2 (two) times daily.   Fish Oil 1000 MG Caps Take 1,000 mg by mouth daily.   furosemide 40 MG tablet Commonly known as:  LASIX Take 40 mg by mouth as needed (once a day as needed).   glipiZIDE 5 MG 24 hr tablet Commonly known as:  GLUCOTROL XL Take 5 mg by mouth daily with breakfast.   hydrochlorothiazide 25 MG tablet Commonly known as:  HYDRODIURIL Take 25 mg by mouth daily.   HYDROcodone-acetaminophen 10-325 MG tablet Commonly known as:  NORCO Take 1 tablet by mouth every 6 (six) hours as needed (for pain.).   levothyroxine 137 MCG tablet Commonly known as:  SYNTHROID Take 137 mcg by mouth daily before breakfast.   linaclotide 145 MCG Caps capsule Commonly known as:  LINZESS Take 145 mcg by mouth daily.   Magnesium 250 MG Tabs Take 250 mg by mouth daily.   Melatonin 10 MG Tbdp Take 10 mg by mouth at bedtime as needed (for sleep).   multivitamin with minerals Tabs tablet Take 1 tablet by mouth daily. One-A-Day Women's Multivitamin   oxyCODONE-acetaminophen 5-325 MG tablet Commonly known as:  PERCOCET/ROXICET Take 1 tablet by mouth every 6 (six) hours as needed for moderate pain.   pantoprazole 40 MG tablet Commonly known as:  PROTONIX Take 40 mg by mouth daily.   Pazeo 0.7 % Soln Generic drug:  Olopatadine HCl Place 1 drop into both eyes daily.   potassium chloride 10 MEQ tablet Commonly known as:  K-DUR Take 10 mEq by mouth 3 (three) times daily.   pregabalin 100 MG capsule Commonly known as:  LYRICA Take 100 mg by mouth 3 (three) times daily.   REFRESH OP Place 1 drop into both eyes 3 (three) times daily.   rivaroxaban 20 MG Tabs tablet Commonly known as:  Xarelto Take 1 tablet (20 mg total) by mouth daily with supper.   rOPINIRole 2 MG tablet Commonly known as:  REQUIP Take 2 mg by mouth 2 (two) times daily.   triamcinolone 0.1  % paste Commonly known as:  KENALOG Use as directed 1 application in the mouth or throat 4 (four) times daily as needed (mouth ulcers).   vitamin C 500 MG tablet Commonly known as:  ASCORBIC ACID Take 500 mg by mouth at bedtime.   Vitamin D3 125 MCG (5000 UT) Caps Take 5,000 Units by mouth at bedtime.            Durable Medical Equipment  (From admission, onward)         Start     Ordered   04/03/19 0908  For home use only DME Walker rolling  Once    Comments:  S/p 1.  Exploration left common femoral artery and vein greater than 30 days 2.  Patch angioplasty left common femoral vein 3.  Ultrasound-guided cannulation  right internal jugular vein 4.  Stent of left common, external iliac veins and left common femoral vein with 13 x 10 cm via bond distally extended with 13 x 5 cm Biobond into the external iliac vein and centrally placed 14 x 155mm Vici stent  Question:  Patient needs a walker to treat with the following condition  Answer:  DVT (deep venous thrombosis) ([place])   04/03/19 0911   04/03/19 0908  For home use only DME Bedside commode  Once    Comments:  S/p 1.  Exploration left common femoral artery and vein greater than 30 days 2.  Patch angioplasty left common femoral vein 3.  Ultrasound-guided cannulation right internal jugular vein 4.  Stent of left common, external iliac veins and left common femoral vein with 13 x 10 cm via bond distally extended with 13 x 5 cm Biobond into the external iliac vein and centrally placed 14 x 115mm Vici stent  Question:  Patient needs a bedside commode to treat with the following condition  Answer:  DVT (deep venous thrombosis) ([place])   04/03/19 0911         Activity: activity as tolerated, elevate BLE when sitting Diet: regular diet Wound Care: unna boot LLE  Follow-up with Dr. Donzetta Matters in 2 weeks.  SignedDagoberto Ligas 04/05/2019 3:21 PM

## 2019-04-05 NOTE — Discharge Instructions (Signed)
Incision Care, Adult °An incision is a cut that a doctor makes in your skin for surgery (for a procedure). Most times, these cuts are closed after surgery. Your cut from surgery may be closed with stitches (sutures), staples, skin glue, or skin tape (adhesive strips). You may need to return to your doctor to have stitches or staples taken out. This may happen many days or many weeks after your surgery. The cut needs to be well cared for so it does not get infected. °How to care for your cut °Cut care ° °· Follow instructions from your doctor about how to take care of your cut. Make sure you: °? Wash your hands with soap and water before you change your bandage (dressing). If you cannot use soap and water, use hand sanitizer. °? Change your bandage as told by your doctor. °? Leave stitches, skin glue, or skin tape in place. They may need to stay in place for 2 weeks or longer. If tape strips get loose and curl up, you may trim the loose edges. Do not remove tape strips completely unless your doctor says it is okay. °· Check your cut area every day for signs of infection. Check for: °? More redness, swelling, or pain. °? More fluid or blood. °? Warmth. °? Pus or a bad smell. °· Ask your doctor how to clean the cut. This may include: °? Using mild soap and water. °? Using a clean towel to pat the cut dry after you clean it. °? Putting a cream or ointment on the cut. Do this only as told by your doctor. °? Covering the cut with a clean bandage. °· Ask your doctor when you can leave the cut uncovered. °· Do not take baths, swim, or use a hot tub until your doctor says it is okay. Ask your doctor if you can take showers. You may only be allowed to take sponge baths for bathing. °Medicines °· If you were prescribed an antibiotic medicine, cream, or ointment, take the antibiotic or put it on the cut as told by your doctor. Do not stop taking or putting on the antibiotic even if your condition gets better. °· Take  over-the-counter and prescription medicines only as told by your doctor. °General instructions °· Limit movement around your cut. This helps healing. °? Avoid straining, lifting, or exercise for the first month, or for as long as told by your doctor. °? Follow instructions from your doctor about going back to your normal activities. °? Ask your doctor what activities are safe. °· Protect your cut from the sun when you are outside for the first 6 months, or for as long as told by your doctor. Put on sunscreen around the scar or cover up the scar. °· Keep all follow-up visits as told by your doctor. This is important. °Contact a doctor if: °· Your have more redness, swelling, or pain around the cut. °· You have more fluid or blood coming from the cut. °· Your cut feels warm to the touch. °· You have pus or a bad smell coming from the cut. °· You have a fever or shaking chills. °· You feel sick to your stomach (nauseous) or you throw up (vomit). °· You are dizzy. °· Your stitches or staples come undone. °Get help right away if: °· You have a red streak coming from your cut. °· Your cut bleeds through the bandage and the bleeding does not stop with gentle pressure. °· The edges of your cut   open up and separate. °· You have very bad (severe) pain. °· You have a rash. °· You are confused. °· You pass out (faint). °· You have trouble breathing and you have a fast heartbeat. °This information is not intended to replace advice given to you by your health care provider. Make sure you discuss any questions you have with your health care provider. °Document Released: 01/13/2012 Document Revised: 06/28/2016 Document Reviewed: 06/28/2016 °Elsevier Interactive Patient Education © 2019 [employer] Inc. ° °

## 2019-04-05 NOTE — Progress Notes (Signed)
Patient in a stable condition, discharge education reviewed with patient she verbalized understanding, iv removed, tele dc ccmd notified, patient equipment for home use at bedside along with her other belongings, [facility] pharmacy to deliver med to patient, patient awaiting her daughter for transportation home.

## 2019-04-05 NOTE — Progress Notes (Signed)
Occupational Therapy Treatment Patient Details Name: Jane [name] MRN: [id number] DOB: 23-Feb-1966 Today's Date: 04/05/2019    History of present illness 53 y.o. female is s/p patch L CFV and stenting of L common and external iliac veins    OT comments  Pt progressing towards OT goals this session. Pt fully dressed when OT entered the room, supervision for transfers, and reviewed energy conservation education with handout. Current POC remains appropriate. HHOT remains appropriate.   Follow Up Recommendations  Home health OT    Equipment Recommendations  None recommended by OT    Recommendations for Other Services      Precautions / Restrictions Precautions Precautions: Fall Restrictions Weight Bearing Restrictions: No       Mobility Bed Mobility Overal bed mobility: Independent                Transfers Overall transfer level: Needs assistance Equipment used: Rolling walker (2 wheeled) Transfers: Sit to/from Stand;Stand Pivot Transfers Sit to Stand: Supervision Stand pivot transfers: Supervision       General transfer comment: vc for safe hand placement, able to power up without physical assist    Balance Overall balance assessment: Needs assistance Sitting-balance support: No upper extremity supported;Feet supported Sitting balance-Leahy Scale: Good     Standing balance support: Bilateral upper extremity supported;During functional activity Standing balance-Leahy Scale: Poor Standing balance comment: relies on UE support                           ADL either performed or assessed with clinical judgement   ADL Overall ADL's : Needs assistance/impaired     Grooming: Wash/dry hands;Sitting;Set up;Wash/dry face Grooming Details (indicate cue type and reason): in recliner         Upper Body Dressing : Modified independent;Sitting Upper Body Dressing Details (indicate cue type and reason): pt reports that she put on her own clothes Lower  Body Dressing: Modified independent;Sit to/from stand Lower Body Dressing Details (indicate cue type and reason): Pt reports dressing herself completely Toilet Transfer: Supervision/safety             General ADL Comments: fatigues quickly, decreased activity tolerance     Vision       Perception     Praxis      Cognition Arousal/Alertness: Awake/alert Behavior During Therapy: WFL for tasks assessed/performed Overall Cognitive Status: Within Functional Limits for tasks assessed                                          Exercises     Shoulder Instructions       General Comments reviewed energy conservation education/handout    Pertinent Vitals/ Pain       Pain Assessment: No/denies pain Pain Intervention(s): Monitored during session  Home Living                                          Prior Functioning/Environment              Frequency  Min 2X/week        Progress Toward Goals  OT Goals(current goals can now be found in the care plan section)  Progress towards OT goals: Progressing toward goals  Acute Rehab OT Goals Patient Stated Goal: to  go home OT Goal Formulation: With patient Time For Goal Achievement: 04/16/19 Potential to Achieve Goals: Good  Plan Discharge plan remains appropriate    Co-evaluation                 AM-PAC OT "6 Clicks" Daily Activity     Outcome Measure   Help from another person eating meals?: None Help from another person taking care of personal grooming?: A Little Help from another person toileting, which includes using toliet, bedpan, or urinal?: A Little Help from another person bathing (including washing, rinsing, drying)?: A Little Help from another person to put on and taking off regular upper body clothing?: A Little Help from another person to put on and taking off regular lower body clothing?: A Little 6 Click Score: 19    End of Session Equipment Utilized  During Treatment: Rolling walker  OT Visit Diagnosis: Unsteadiness on feet (R26.81);Other abnormalities of gait and mobility (R26.89);Muscle weakness (generalized) (M62.81)   Activity Tolerance Patient tolerated treatment well   Patient Left in chair   Nurse Communication Mobility status        Time: 1610-9604 OT Time Calculation (min): 19 min  Charges: OT General Charges $OT Visit: 1 Visit OT Treatments $Self Care/Home Management : 8-22 mins  Hulda Humphrey OTR/L Acute Rehabilitation Services Pager: [phone] Office: [place] 04/05/2019, 5:14 PM

## 2019-04-05 NOTE — Progress Notes (Signed)
PT Cancellation Note  Patient Details Name: Jane [name] MRN: [id number] DOB: [birth year]   Cancelled Treatment:    Reason Eval/Treat Not Completed: Other (comment)(Pt going home.  All home needs were met. )   Denice Paradise 04/05/2019, 12:29 PM  Haidan Nhan,PT Acute Rehabilitation Services Pager:  [phone]  Office:  [phone]

## 2019-04-07 ENCOUNTER — Encounter (HOSPITAL_COMMUNITY): Payer: Self-pay | Admitting: Vascular Surgery

## 2019-04-08 ENCOUNTER — Telehealth (HOSPITAL_COMMUNITY): Payer: Self-pay | Admitting: Rehabilitation

## 2019-04-08 NOTE — Telephone Encounter (Signed)

## 2019-04-09 ENCOUNTER — Other Ambulatory Visit: Payer: Self-pay

## 2019-04-09 ENCOUNTER — Encounter: Payer: Self-pay | Admitting: Family

## 2019-04-09 ENCOUNTER — Ambulatory Visit: Payer: Medicaid Other | Admitting: Family

## 2019-04-09 VITALS — BP 120/72 | HR 68 | Temp 98.1°F | Resp 14 | Ht 70.0 in | Wt 293.0 lb

## 2019-04-09 DIAGNOSIS — M7989 Other specified soft tissue disorders: Secondary | ICD-10-CM

## 2019-04-09 NOTE — Progress Notes (Signed)
Pt came in today for an Unnaboot change. She had however cut the previous dressing off herself she said that day before. She said that it was irritating her leg. She showed me a couple places on her leg that she was bothered by. Legs overall appeared fine. There were a couple of places where the dressing may have been creased that left a little mark but no skin breakdown or wounds.   Asked her if she was ok with applying the boots and she said yes - dressing changed with no issues, pt said that she was ok with how it feels.   Scheduled to come back to see Dr Donzetta Matters next week.   York Cerise, CMA

## 2019-04-16 ENCOUNTER — Other Ambulatory Visit: Payer: Self-pay

## 2019-04-16 ENCOUNTER — Ambulatory Visit (INDEPENDENT_AMBULATORY_CARE_PROVIDER_SITE_OTHER): Payer: Medicaid Other | Admitting: Vascular Surgery

## 2019-04-16 VITALS — BP 125/74 | HR 90 | Temp 98.3°F | Resp 20 | Ht 70.0 in | Wt 286.0 lb

## 2019-04-16 DIAGNOSIS — I871 Compression of vein: Secondary | ICD-10-CM

## 2019-04-16 DIAGNOSIS — I87002 Postthrombotic syndrome without complications of left lower extremity: Secondary | ICD-10-CM

## 2019-04-16 NOTE — Progress Notes (Signed)
    Subjective:     Patient ID: Jane [name], female   DOB: 09/24/1966, 53 y.o.   MRN: [id number]  HPI 53 year old female follows up from recent exploration of her left common femoral vein that required patch angioplasty and stenting of her common external iliac veins down to the left common femoral veins with viabahn and vici stents.  She is remained on Xarelto.  She still has some drainage from her left groin.  Was sent home on Unna boot but now wearing compression stockings.  States that her swelling is getting some better.  Overall she has been satisfied with her level of reprieve from her most recent surgery.  She has not had any fevers or purulent drainage from the groin.   Review of Systems Persistent left leg swelling Persistent groin drainage    Objective:   Physical Exam Vitals:   04/16/19 0955  BP: 125/74  Pulse: 90  Resp: 20  Temp: 98.3 F (36.8 C)  SpO2: 100%  Awake alert oriented Nonlabored respirations Left groin does have some swelling although the incision is mostly intact with some fibrinous exudate and serous drainage.  No evidence of erythema or purulence Left leg has improved in size still edematous from the knee down     Assessment/plan     53 year old female previously had a left to right femoral to femoral vein bypass.  Had subsequently failed she is undergone stenting from an open approach.  She still has swelling although it is somewhat improved.  At this time she does not want any further procedures so we will watch the serous drainage in her left groin although the treatment would likely be opening it and placing a wound VAC.  She will follow-up in a few weeks with duplex of the stents unless she has issues prior to this.  Ultimately if swelling persists she might benefit from venogram to demonstrate stent patency and also possibly in the future she would benefit from left lower extremity angiogram to coil her AV fistula.         Jane Rua C. Donzetta Matters, MD  [facility] [place] [facility] Office: [phone] Pager: [phone]

## 2019-04-20 ENCOUNTER — Other Ambulatory Visit (HOSPITAL_BASED_OUTPATIENT_CLINIC_OR_DEPARTMENT_OTHER): Payer: Self-pay

## 2019-04-23 ENCOUNTER — Encounter: Payer: Self-pay | Admitting: Vascular Surgery

## 2019-04-23 ENCOUNTER — Other Ambulatory Visit: Payer: Self-pay

## 2019-04-23 ENCOUNTER — Telehealth: Payer: Self-pay

## 2019-04-23 ENCOUNTER — Ambulatory Visit (HOSPITAL_COMMUNITY): Payer: Self-pay

## 2019-04-23 ENCOUNTER — Ambulatory Visit (INDEPENDENT_AMBULATORY_CARE_PROVIDER_SITE_OTHER): Payer: Medicaid Other | Admitting: Vascular Surgery

## 2019-04-23 ENCOUNTER — Encounter: Payer: Self-pay | Admitting: *Deleted

## 2019-04-23 ENCOUNTER — Other Ambulatory Visit: Payer: Self-pay | Admitting: *Deleted

## 2019-04-23 VITALS — BP 111/69 | HR 87 | Temp 98.2°F | Resp 20 | Ht 70.0 in | Wt 278.0 lb

## 2019-04-23 DIAGNOSIS — M7989 Other specified soft tissue disorders: Secondary | ICD-10-CM

## 2019-04-23 DIAGNOSIS — I871 Compression of vein: Secondary | ICD-10-CM

## 2019-04-23 DIAGNOSIS — I872 Venous insufficiency (chronic) (peripheral): Secondary | ICD-10-CM

## 2019-04-23 DIAGNOSIS — T8189XA Other complications of procedures, not elsewhere classified, initial encounter: Secondary | ICD-10-CM

## 2019-04-23 MED ORDER — SULFAMETHOXAZOLE-TRIMETHOPRIM 400-80 MG PO TABS: 1.0000 | ORAL_TABLET | Freq: Two times a day (BID) | ORAL | 0 refills | Status: DC

## 2019-04-23 NOTE — Progress Notes (Signed)
Patient ID: Jane [name], female   DOB: 11-25-1965, 53 y.o.   MRN: [id number]  Reason for Consult: Wound Check   Referred by Sinda Du, MD  Subjective:     HPI:  Jane [name] is a 53 y.o. 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  This was done via open approach was complicated with rupture of her external iliac vein with exposed stent.  She now has drainage from her left groin for which we are asked to see her in the office today.  She states that maybe she is having low-grade fevers.  She has not been on any antibiotics.  Swelling in her left lower extremity is significantly improved.  Past Medical History:  Diagnosis Date  . Anxiety   . Bipolar disorder ([place])   . Chronic kidney disease    Stage 3 kidney disease;dx by Dr. Sinda Du.   . Constipation   . Depression   . Diabetes mellitus without complication ([place])   . Dyspnea    with exertion  . GERD (gastroesophageal reflux disease)   . Headache    migraines  . High cholesterol   . History of DVT (deep vein thrombosis)    left leg  . Hypertension    states under control with meds., has been on med. x 2 years  . Hypothyroidism   . Obsessive-compulsive disorder   . Peripheral vascular disease ([place])   . Restless leg syndrome   . Trigger thumb of left hand 01/2018  . Trigger thumb of right hand    Family History  Problem Relation Age of Onset  . Heart disease Mother   . Hyperlipidemia Mother   . Hypertension Mother   . Bipolar disorder Mother   . Diabetes Father   . Heart disease Father   . Hyperlipidemia Father   . Hypertension Father   . Drug abuse Daughter   . ADD / ADHD Daughter   . Drug abuse Daughter   . Anxiety disorder Daughter   . Bipolar disorder Daughter   . Hypertension Sister   . Hypertension Brother   . Hyperlipidemia Brother   . Heart disease Brother   . Bipolar disorder Maternal Aunt   . Suicidality Maternal Aunt    Past Surgical  History:  Procedure Laterality Date  . ABDOMINAL HYSTERECTOMY  06/2016   complete  . AV FISTULA PLACEMENT Left 11/24/2018   Procedure: ARTERIOVENOUS (AV) FISTULA CREATION LEFT SFA TO LEFT FEMORAL VEIN;  Surgeon: Waynetta Sandy, MD;  Location: [place];  Service: Vascular;  Laterality: Left;  . CHOLECYSTECTOMY    . FEMORAL ARTERY EXPLORATION  03/30/2019   Procedure: Left Common Femoral Artery and Vein Exploration;  Surgeon: Waynetta Sandy, MD;  Location: [facility];  Service: Vascular;;  . FEMORAL-FEMORAL BYPASS GRAFT Left 11/24/2018   Procedure: BYPASS GRAFT FEMORAL-FEMORAL VENOUS LEFT TO RIGHT PALMA PROCEDURE USING CRYOVEIN;  Surgeon: Waynetta Sandy, MD;  Location: [place];  Service: Vascular;  Laterality: Left;  . INSERTION OF ILIAC STENT  03/30/2019   Procedure: Stent of left common, external iliac veins and left common femoral vein;  Surgeon: Waynetta Sandy, MD;  Location: [facility];  Service: Vascular;;  . LOWER EXTREMITY VENOGRAPHY N/A 08/17/2018   Procedure: LOWER EXTREMITY VENOGRAPHY - Central Venogram;  Surgeon: Waynetta Sandy, MD;  Location: [facility];  Service: Cardiovascular;  Laterality: N/A;  . LOWER EXTREMITY VENOGRAPHY Bilateral 03/09/2019   Procedure: LOWER EXTREMITY VENOGRAPHY;  Surgeon:  Waynetta Sandy, MD;  Location: [facility];  Service: Cardiovascular;  Laterality: Bilateral;  . LUMBAR FUSION  11/21/2000   L5-S1  . LUMBAR SPINE SURGERY     x 2 others  . PATCH ANGIOPLASTY Left 03/30/2019   Procedure: Patch Angioplasty of the Left Common Femoral Vein using Venosure Biologic patch;  Surgeon: Waynetta Sandy, MD;  Location: [facility];  Service: Vascular;  Laterality: Left;  . TRIGGER FINGER RELEASE Right 12/01/2017   Procedure: RELEASE TRIGGER FINGER/A-1 PULLEY RIGHT THUMB;  Surgeon: Leanora Cover, MD;  Location: [facility];  Service: Orthopedics;  Laterality: Right;  . TRIGGER FINGER RELEASE  Left 01/26/2018   Procedure: LEFT TRIGGER THUMB RELEASE;  Surgeon: Leanora Cover, MD;  Location: [facility];  Service: Orthopedics;  Laterality: Left;  . ULTRASOUND GUIDANCE FOR VASCULAR ACCESS Right 03/30/2019   Procedure: Ultrasound-guided cannulation right internal jugular vein;  Surgeon: Waynetta Sandy, MD;  Location: [facility];  Service: Vascular;  Laterality: Right;    Short Social History:  Social History   Tobacco Use  . Smoking status: Never Smoker  . Smokeless tobacco: Never Used  Substance Use Topics  . Alcohol use: No    Allergies  Allergen Reactions  . Aripiprazole Other (See Comments)    BECOMES  VIOLENT   . Seroquel [Quetiapine Fumarate] Other (See Comments)    BECOMES VIOLENT  . Chlorpromazine Other (See Comments)    SEVERE ANXIETY   . Gabapentin Other (See Comments)    NIGHTMARES     Current Outpatient Medications  Medication Sig Dispense Refill  . ALPRAZolam (XANAX) 1 MG tablet Take 1 tablet (1 mg total) by mouth 2 (two) times daily as needed for anxiety. 60 tablet 2  . amphetamine-dextroamphetamine (ADDERALL) 30 MG tablet Take 1 tablet by mouth 2 (two) times daily. Fill after 10/27/18 60 tablet 0  . aspirin EC 81 MG tablet Take 81 mg by mouth daily.    Marland Kitchen atorvastatin (LIPITOR) 20 MG tablet Take 20 mg by mouth daily.    . cetirizine (ZYRTEC) 10 MG tablet Take 10 mg by mouth daily.    . Cholecalciferol (VITAMIN D3) 125 MCG (5000 UT) CAPS Take 5,000 Units by mouth at bedtime.    . cycloSPORINE (RESTASIS) 0.05 % ophthalmic emulsion Place 1 drop into both eyes 2 (two) times daily.    . DULoxetine (CYMBALTA) 60 MG capsule Take 1 capsule (60 mg total) by mouth 2 (two) times daily. 60 capsule 2  . furosemide (LASIX) 40 MG tablet Take 40 mg by mouth as needed (once a day as needed).    Marland Kitchen glipiZIDE (GLUCOTROL XL) 5 MG 24 hr tablet Take 5 mg by mouth daily with breakfast.    . hydrochlorothiazide (HYDRODIURIL) 25 MG tablet Take 25 mg by mouth  daily.    Marland Kitchen HYDROcodone-acetaminophen (NORCO) 10-325 MG tablet Take 1 tablet by mouth every 6 (six) hours as needed (for pain.).    Marland Kitchen levothyroxine (SYNTHROID, LEVOTHROID) 137 MCG tablet Take 137 mcg by mouth daily before breakfast.    . linaclotide (LINZESS) 145 MCG CAPS capsule Take 145 mcg by mouth daily.     . Magnesium 250 MG TABS Take 250 mg by mouth daily.    . Melatonin 10 MG TBDP Take 10 mg by mouth at bedtime as needed (for sleep).    . Multiple Vitamin (MULTIVITAMIN WITH MINERALS) TABS tablet Take 1 tablet by mouth daily. One-A-Day Women's Multivitamin    . Omega-3 Fatty Acids (  FISH OIL) 1000 MG CAPS Take 1,000 mg by mouth daily.     Marland Kitchen oxyCODONE-acetaminophen (PERCOCET/ROXICET) 5-325 MG tablet Take 1 tablet by mouth every 6 (six) hours as needed for moderate pain. 15 tablet 0  . pantoprazole (PROTONIX) 40 MG tablet Take 40 mg by mouth daily.    Marland Kitchen PAZEO 0.7 % SOLN Place 1 drop into both eyes daily.     . Polyvinyl Alcohol-Povidone (REFRESH OP) Place 1 drop into both eyes 3 (three) times daily.     . potassium chloride (K-DUR) 10 MEQ tablet Take 10 mEq by mouth 3 (three) times daily.    . pregabalin (LYRICA) 100 MG capsule Take 100 mg by mouth 3 (three) times daily.    . rivaroxaban (XARELTO) 20 MG TABS tablet Take 1 tablet (20 mg total) by mouth daily with supper. 30 tablet 6  . rOPINIRole (REQUIP) 2 MG tablet Take 2 mg by mouth 2 (two) times daily.    Marland Kitchen triamcinolone (KENALOG) 0.1 % paste Use as directed 1 application in the mouth or throat 4 (four) times daily as needed (mouth ulcers).    . vitamin C (ASCORBIC ACID) 500 MG tablet Take 500 mg by mouth at bedtime.    . sulfamethoxazole-trimethoprim (BACTRIM) 400-80 MG tablet Take 1 tablet by mouth 2 (two) times daily for 7 days. 14 tablet 0   No current facility-administered medications for this visit.     Review of Systems  Constitutional:  Constitutional negative. HENT: HENT negative.  Eyes: Eyes negative.  Respiratory:  Respiratory negative.  Cardiovascular: Positive for leg swelling.  Musculoskeletal: Positive for leg pain.  Skin: Positive for wound.  Neurological: Neurological negative. Hematologic: Positive for bruises/bleeds easily.  Psychiatric: Psychiatric negative.        Objective:  Objective   Vitals:   04/23/19 1418  BP: 111/69  Pulse: 87  Resp: 20  Temp: 98.2 F (36.8 C)  SpO2: 96%  Weight: 278 lb (126.1 kg)  Height: 5\' 10"  (1.778 m)   Body mass index is 39.89 kg/m.  Physical Exam HENT:     Head: Normocephalic.     Nose: Nose normal.  Eyes:     Pupils: Pupils are equal, round, and reactive to light.  Neck:     Musculoskeletal: Normal range of motion.  Cardiovascular:     Rate and Rhythm: Normal rate.  Abdominal:     General: Abdomen is flat.     Palpations: Abdomen is soft.  Musculoskeletal:     Comments: Left lower extremity swelling much improved from preoperative exam  Skin:    Capillary Refill: Capillary refill takes less than 2 seconds.     Comments: Serous drainage left groin incision, no erythema  Neurological:     General: No focal deficit present.     Mental Status: She is alert.  Psychiatric:        Mood and Affect: Mood normal.        Behavior: Behavior normal.        Thought Content: Thought content normal.        Judgment: Judgment normal.     Data: No studies today     Assessment/Plan:     53 year old female status post above procedure.  Unfortunately she now has serous drainage from her groin although does not appear infected we will start her on antibiotics given the exposed stent in the incision.  We will plan to take her to the operating room for opening of the incision with likely placement  of wound VAC.  I want her to continue her blood thinners throughout this operation without holding them given her recent stenting.  She demonstrates good understanding we will get her set up for next week.     Waynetta Sandy MD [facility]

## 2019-04-23 NOTE — Telephone Encounter (Signed)
Pt called and stated that her wound has an infection. She said that she has 2 open holes that are draining. She said that it has a terrible foul odor and she has had fever and chills. She said that her home health nurse called and one of Dr Claretha Cooper associates told her to not worry about the issues but she said that she is very concerned. She said that the area is also red and warm to the touch.   Spoke with Dr Donzetta Matters and we will bring patient in to be seen today to evaluate the wound.   York Cerise, CMA

## 2019-04-26 ENCOUNTER — Encounter (HOSPITAL_COMMUNITY): Payer: Self-pay | Admitting: *Deleted

## 2019-04-26 ENCOUNTER — Other Ambulatory Visit: Payer: Self-pay

## 2019-04-26 MED ORDER — DEXTROSE 5 % IV SOLN
3.0000 g | INTRAVENOUS | Status: AC
  Administered 2019-04-27: 3 g via INTRAVENOUS
  Filled 2019-04-26: qty 3

## 2019-04-26 NOTE — Progress Notes (Signed)
Denies chest pain, shob, or cardiologist visit. Requested patient go by [facility] prior to coming to hospital for COVID testing. Below instructions given regarding DM.  How to Manage Your Diabetes Before and After Surgery  Why is it important to control my blood sugar before and after surgery? . Improving blood sugar levels before and after surgery helps healing and can limit problems. . A way of improving blood sugar control is eating a healthy diet by: o  Eating less sugar and carbohydrates o  Increasing activity/exercise o  Talking with your doctor about reaching your blood sugar goals . High blood sugars (greater than 180 mg/dL) can raise your risk of infections and slow your recovery, so you will need to focus on controlling your diabetes during the weeks before surgery. . Make sure that the doctor who takes care of your diabetes knows about your planned surgery including the date and location.  How do I manage my blood sugar before surgery? . Check your blood sugar at least 4 times a day, starting 2 days before surgery, to make sure that the level is not too high or low. o Check your blood sugar the morning of your surgery when you wake up and every 2 hours until you get to the Short Stay unit. . If your blood sugar is less than 70 mg/dL, you will need to treat for low blood sugar: o Do not take insulin. o Treat a low blood sugar (less than 70 mg/dL) with  cup of clear juice (cranberry or apple), 4 glucose tablets, OR glucose gel. Recheck blood sugar in 15 minutes after treatment (to make sure it is greater than 70 mg/dL). If your blood sugar is not greater than 70 mg/dL on recheck, call [phone] o  for further instructions. . Report your blood sugar to the short stay nurse when you get to Short Stay.  . If you are admitted to the hospital after surgery: o Your blood sugar will be checked by the staff and you will probably be given insulin after surgery (instead of oral  diabetes medicines) to make sure you have good blood sugar levels. o The goal for blood sugar control after surgery is 80-180 mg/dL.

## 2019-04-27 ENCOUNTER — Ambulatory Visit (HOSPITAL_COMMUNITY): Payer: Medicaid Other | Admitting: Anesthesiology

## 2019-04-27 ENCOUNTER — Other Ambulatory Visit (HOSPITAL_COMMUNITY)
Admission: RE | Admit: 2019-04-27 | Discharge: 2019-04-27 | Disposition: A | Discharge status: Home / Self Care | Payer: Medicaid Other | Source: Ambulatory Visit | Attending: Vascular Surgery | Admitting: Vascular Surgery

## 2019-04-27 ENCOUNTER — Encounter (HOSPITAL_COMMUNITY)
Admission: RE | Disposition: A | Discharge status: Home Health | Payer: Self-pay | Source: Home / Self Care | Attending: Vascular Surgery

## 2019-04-27 ENCOUNTER — Inpatient Hospital Stay (HOSPITAL_COMMUNITY)
Admission: RE | Admit: 2019-04-27 | Discharge: 2019-04-29 | DRG: 857 | Disposition: A | Discharge status: Home Health | Payer: Medicaid Other | Attending: Vascular Surgery | Admitting: Vascular Surgery

## 2019-04-27 ENCOUNTER — Encounter (HOSPITAL_COMMUNITY): Payer: Self-pay | Admitting: Surgery

## 2019-04-27 DIAGNOSIS — Y838 Other surgical procedures as the cause of abnormal reaction of the patient, or of later complication, without mention of misadventure at the time of the procedure: Secondary | ICD-10-CM | POA: Diagnosis present

## 2019-04-27 DIAGNOSIS — B9620 Unspecified Escherichia coli [E. coli] as the cause of diseases classified elsewhere: Secondary | ICD-10-CM | POA: Diagnosis present

## 2019-04-27 DIAGNOSIS — I129 Hypertensive chronic kidney disease with stage 1 through stage 4 chronic kidney disease, or unspecified chronic kidney disease: Secondary | ICD-10-CM | POA: Diagnosis present

## 2019-04-27 DIAGNOSIS — G2581 Restless legs syndrome: Secondary | ICD-10-CM | POA: Diagnosis present

## 2019-04-27 DIAGNOSIS — T8141XA Infection following a procedure, superficial incisional surgical site, initial encounter: Principal | ICD-10-CM | POA: Diagnosis present

## 2019-04-27 DIAGNOSIS — S31109A Unspecified open wound of abdominal wall, unspecified quadrant without penetration into peritoneal cavity, initial encounter: Secondary | ICD-10-CM | POA: Diagnosis present

## 2019-04-27 DIAGNOSIS — Z86718 Personal history of other venous thrombosis and embolism: Secondary | ICD-10-CM

## 2019-04-27 DIAGNOSIS — D631 Anemia in chronic kidney disease: Secondary | ICD-10-CM | POA: Diagnosis present

## 2019-04-27 DIAGNOSIS — K219 Gastro-esophageal reflux disease without esophagitis: Secondary | ICD-10-CM | POA: Diagnosis present

## 2019-04-27 DIAGNOSIS — F319 Bipolar disorder, unspecified: Secondary | ICD-10-CM | POA: Diagnosis present

## 2019-04-27 DIAGNOSIS — E039 Hypothyroidism, unspecified: Secondary | ICD-10-CM | POA: Diagnosis present

## 2019-04-27 DIAGNOSIS — E1151 Type 2 diabetes mellitus with diabetic peripheral angiopathy without gangrene: Secondary | ICD-10-CM | POA: Diagnosis present

## 2019-04-27 DIAGNOSIS — I9789 Other postprocedural complications and disorders of the circulatory system, not elsewhere classified: Secondary | ICD-10-CM

## 2019-04-27 DIAGNOSIS — Z1159 Encounter for screening for other viral diseases: Secondary | ICD-10-CM | POA: Insufficient documentation

## 2019-04-27 DIAGNOSIS — Z888 Allergy status to other drugs, medicaments and biological substances status: Secondary | ICD-10-CM

## 2019-04-27 DIAGNOSIS — E7800 Pure hypercholesterolemia, unspecified: Secondary | ICD-10-CM | POA: Diagnosis present

## 2019-04-27 DIAGNOSIS — B952 Enterococcus as the cause of diseases classified elsewhere: Secondary | ICD-10-CM | POA: Diagnosis present

## 2019-04-27 DIAGNOSIS — I871 Compression of vein: Secondary | ICD-10-CM | POA: Diagnosis present

## 2019-04-27 DIAGNOSIS — N183 Chronic kidney disease, stage 3 (moderate): Secondary | ICD-10-CM | POA: Diagnosis present

## 2019-04-27 DIAGNOSIS — F429 Obsessive-compulsive disorder, unspecified: Secondary | ICD-10-CM | POA: Diagnosis present

## 2019-04-27 DIAGNOSIS — E1122 Type 2 diabetes mellitus with diabetic chronic kidney disease: Secondary | ICD-10-CM | POA: Diagnosis present

## 2019-04-27 HISTORY — PX: GROIN DEBRIDEMENT: SHX5159

## 2019-04-27 HISTORY — DX: Polyneuropathy, unspecified: G62.9

## 2019-04-27 HISTORY — PX: APPLICATION OF WOUND VAC: SHX5189

## 2019-04-27 LAB — BASIC METABOLIC PANEL
Anion gap: 11 (ref 5–15)
BUN: 28 mg/dL — ABNORMAL HIGH (ref 6–20)
CO2: 22 mmol/L (ref 22–32)
Calcium: 9.1 mg/dL (ref 8.9–10.3)
Chloride: 108 mmol/L (ref 98–111)
Creatinine, Ser: 1.96 mg/dL — ABNORMAL HIGH (ref 0.44–1.00)
GFR calc Af Amer: 33 mL/min — ABNORMAL LOW (ref >60)
GFR calc non Af Amer: 29 mL/min — ABNORMAL LOW (ref >60)
Glucose, Bld: 101 mg/dL — ABNORMAL HIGH (ref 70–99)
Potassium: 4 mmol/L (ref 3.5–5.1)
Sodium: 141 mmol/L (ref 135–145)

## 2019-04-27 LAB — CBC
HCT: 27.7 % — ABNORMAL LOW (ref 36.0–46.0)
Hemoglobin: 8 g/dL — ABNORMAL LOW (ref 12.0–15.0)
MCH: 25 pg — ABNORMAL LOW (ref 26.0–34.0)
MCHC: 28.9 g/dL — ABNORMAL LOW (ref 30.0–36.0)
MCV: 86.6 fL (ref 80.0–100.0)
Platelets: 236 10*3/uL (ref 150–400)
RBC: 3.2 MIL/uL — ABNORMAL LOW (ref 3.87–5.11)
RDW: 15.9 % — ABNORMAL HIGH (ref 11.5–15.5)
WBC: 9.5 10*3/uL (ref 4.0–10.5)
nRBC: 0 % (ref 0.0–0.2)

## 2019-04-27 LAB — PROTIME-INR
INR: 1.8 — ABNORMAL HIGH (ref 0.8–1.2)
Prothrombin Time: 20.6 seconds — ABNORMAL HIGH (ref 11.4–15.2)

## 2019-04-27 LAB — GLUCOSE, CAPILLARY
Glucose-Capillary: 82 mg/dL (ref 70–99)
Glucose-Capillary: 61 mg/dL — ABNORMAL LOW (ref 70–99)
Glucose-Capillary: 93 mg/dL (ref 70–99)
Glucose-Capillary: 76 mg/dL (ref 70–99)
Glucose-Capillary: 151 mg/dL — ABNORMAL HIGH (ref 70–99)

## 2019-04-27 LAB — SARS CORONAVIRUS 2 BY RT PCR (HOSPITAL ORDER, PERFORMED IN ~~LOC~~ HOSPITAL LAB): SARS Coronavirus 2: NEGATIVE

## 2019-04-27 SURGERY — DEBRIDEMENT, INGUINAL REGION
Anesthesia: General | Site: Groin | Laterality: Left

## 2019-04-27 MED ORDER — LINACLOTIDE 145 MCG PO CAPS
145.0000 ug | ORAL_CAPSULE | Freq: Every day | ORAL | Status: DC
  Administered 2019-04-28: 145 ug via ORAL
  Filled 2019-04-27 (×2): qty 1

## 2019-04-27 MED ORDER — ATORVASTATIN CALCIUM 10 MG PO TABS
20.0000 mg | ORAL_TABLET | Freq: Every day | ORAL | Status: DC
  Administered 2019-04-28 – 2019-04-29 (×2): 20 mg via ORAL
  Filled 2019-04-27 (×2): qty 2

## 2019-04-27 MED ORDER — FENTANYL CITRATE (PF) 100 MCG/2ML IJ SOLN
25.0000 ug | INTRAMUSCULAR | Status: DC | PRN
  Administered 2019-04-27 (×3): 50 ug via INTRAVENOUS

## 2019-04-27 MED ORDER — ONDANSETRON HCL 4 MG/2ML IJ SOLN
INTRAMUSCULAR | Status: AC
  Filled 2019-04-27: qty 2

## 2019-04-27 MED ORDER — FENTANYL CITRATE (PF) 100 MCG/2ML IJ SOLN
INTRAMUSCULAR | Status: AC
  Filled 2019-04-27: qty 2

## 2019-04-27 MED ORDER — SULFAMETHOXAZOLE-TRIMETHOPRIM 400-80 MG PO TABS
1.0000 | ORAL_TABLET | Freq: Two times a day (BID) | ORAL | Status: DC
  Administered 2019-04-27 – 2019-04-29 (×4): 1 via ORAL
  Filled 2019-04-27 (×6): qty 1

## 2019-04-27 MED ORDER — HYDROMORPHONE HCL 1 MG/ML IJ SOLN: 0.5000 mg | INTRAMUSCULAR | Status: DC | PRN

## 2019-04-27 MED ORDER — LIDOCAINE 2% (20 MG/ML) 5 ML SYRINGE
INTRAMUSCULAR | Status: DC | PRN
  Administered 2019-04-27: 80 mg via INTRAVENOUS

## 2019-04-27 MED ORDER — LACTATED RINGERS IV SOLN
INTRAVENOUS | Status: DC
  Administered 2019-04-27 (×3): via INTRAVENOUS

## 2019-04-27 MED ORDER — RIVAROXABAN 20 MG PO TABS
20.0000 mg | ORAL_TABLET | Freq: Every day | ORAL | Status: DC
  Administered 2019-04-28: 20 mg via ORAL
  Filled 2019-04-27: qty 1

## 2019-04-27 MED ORDER — FENTANYL CITRATE (PF) 250 MCG/5ML IJ SOLN
INTRAMUSCULAR | Status: DC | PRN
  Administered 2019-04-27 (×2): 25 ug via INTRAVENOUS

## 2019-04-27 MED ORDER — HYDROCHLOROTHIAZIDE 25 MG PO TABS
25.0000 mg | ORAL_TABLET | Freq: Every day | ORAL | Status: DC
  Administered 2019-04-28 – 2019-04-29 (×2): 25 mg via ORAL
  Filled 2019-04-27 (×2): qty 1

## 2019-04-27 MED ORDER — VITAMIN C 500 MG PO TABS
500.0000 mg | ORAL_TABLET | Freq: Every day | ORAL | Status: DC
  Administered 2019-04-28: 500 mg via ORAL
  Filled 2019-04-27 (×2): qty 1

## 2019-04-27 MED ORDER — DEXTROSE 50 % IV SOLN
25.0000 mL | Freq: Once | INTRAVENOUS | Status: AC
  Administered 2019-04-27: 25 mL via INTRAVENOUS
  Filled 2019-04-27: qty 50

## 2019-04-27 MED ORDER — FENTANYL CITRATE (PF) 250 MCG/5ML IJ SOLN
INTRAMUSCULAR | Status: AC
  Filled 2019-04-27: qty 5

## 2019-04-27 MED ORDER — PANTOPRAZOLE SODIUM 40 MG PO TBEC
40.0000 mg | DELAYED_RELEASE_TABLET | Freq: Every day | ORAL | Status: DC
  Administered 2019-04-28 – 2019-04-29 (×2): 40 mg via ORAL
  Filled 2019-04-27 (×2): qty 1

## 2019-04-27 MED ORDER — ONDANSETRON HCL 4 MG/2ML IJ SOLN
INTRAMUSCULAR | Status: DC | PRN
  Administered 2019-04-27: 4 mg via INTRAVENOUS

## 2019-04-27 MED ORDER — MIDAZOLAM HCL 2 MG/2ML IJ SOLN
INTRAMUSCULAR | Status: DC | PRN
  Administered 2019-04-27: 2 mg via INTRAVENOUS

## 2019-04-27 MED ORDER — MAGNESIUM 250 MG PO TABS: 250.0000 mg | ORAL_TABLET | Freq: Every day | ORAL | Status: DC

## 2019-04-27 MED ORDER — MELATONIN 3 MG PO TABS
9.0000 mg | ORAL_TABLET | Freq: Every evening | ORAL | Status: DC | PRN
  Filled 2019-04-27: qty 3

## 2019-04-27 MED ORDER — DEXAMETHASONE SODIUM PHOSPHATE 10 MG/ML IJ SOLN
INTRAMUSCULAR | Status: DC | PRN
  Administered 2019-04-27: 5 mg via INTRAVENOUS

## 2019-04-27 MED ORDER — PHENOL 1.4 % MT LIQD: 1.0000 | OROMUCOSAL | Status: DC | PRN

## 2019-04-27 MED ORDER — PREGABALIN 100 MG PO CAPS
100.0000 mg | ORAL_CAPSULE | Freq: Three times a day (TID) | ORAL | Status: DC
  Administered 2019-04-27 – 2019-04-29 (×5): 100 mg via ORAL
  Filled 2019-04-27 (×5): qty 1

## 2019-04-27 MED ORDER — MIDAZOLAM HCL 2 MG/2ML IJ SOLN
INTRAMUSCULAR | Status: AC
  Filled 2019-04-27: qty 2

## 2019-04-27 MED ORDER — TRIAMCINOLONE ACETONIDE 0.1 % MT PSTE: 1.0000 "application " | PASTE | Freq: Four times a day (QID) | OROMUCOSAL | Status: DC | PRN

## 2019-04-27 MED ORDER — PHENYLEPHRINE HCL (PRESSORS) 10 MG/ML IV SOLN
INTRAVENOUS | Status: DC | PRN
  Administered 2019-04-27: 80 ug via INTRAVENOUS

## 2019-04-27 MED ORDER — VITAMIN D 25 MCG (1000 UNIT) PO TABS
5000.0000 [IU] | ORAL_TABLET | Freq: Every day | ORAL | Status: DC
  Administered 2019-04-28: 5000 [IU] via ORAL
  Filled 2019-04-27: qty 5

## 2019-04-27 MED ORDER — ROPINIROLE HCL 1 MG PO TABS
2.0000 mg | ORAL_TABLET | Freq: Two times a day (BID) | ORAL | Status: DC
  Administered 2019-04-27 – 2019-04-29 (×4): 2 mg via ORAL
  Filled 2019-04-27 (×4): qty 2

## 2019-04-27 MED ORDER — METOPROLOL TARTRATE 5 MG/5ML IV SOLN: 2.0000 mg | INTRAVENOUS | Status: DC | PRN

## 2019-04-27 MED ORDER — OXYCODONE HCL 5 MG PO TABS
5.0000 mg | ORAL_TABLET | ORAL | Status: DC | PRN
  Administered 2019-04-27 – 2019-04-29 (×4): 10 mg via ORAL
  Filled 2019-04-27 (×4): qty 2

## 2019-04-27 MED ORDER — PANTOPRAZOLE SODIUM 40 MG PO TBEC: 40.0000 mg | DELAYED_RELEASE_TABLET | Freq: Every day | ORAL | Status: DC

## 2019-04-27 MED ORDER — ASPIRIN EC 81 MG PO TBEC
81.0000 mg | DELAYED_RELEASE_TABLET | Freq: Every day | ORAL | Status: DC
  Administered 2019-04-28 – 2019-04-29 (×2): 81 mg via ORAL
  Filled 2019-04-27 (×2): qty 1

## 2019-04-27 MED ORDER — FUROSEMIDE 40 MG PO TABS: 40.0000 mg | ORAL_TABLET | ORAL | Status: DC | PRN

## 2019-04-27 MED ORDER — GLIPIZIDE ER 5 MG PO TB24
5.0000 mg | ORAL_TABLET | Freq: Every day | ORAL | Status: DC
  Administered 2019-04-28 – 2019-04-29 (×2): 5 mg via ORAL
  Filled 2019-04-27 (×2): qty 1

## 2019-04-27 MED ORDER — LABETALOL HCL 5 MG/ML IV SOLN: 10.0000 mg | INTRAVENOUS | Status: DC | PRN

## 2019-04-27 MED ORDER — ONDANSETRON HCL 4 MG/2ML IJ SOLN: 4.0000 mg | Freq: Once | INTRAMUSCULAR | Status: DC | PRN

## 2019-04-27 MED ORDER — LEVOTHYROXINE SODIUM 25 MCG PO TABS
137.0000 ug | ORAL_TABLET | Freq: Every day | ORAL | Status: DC
  Administered 2019-04-28 – 2019-04-29 (×2): 137 ug via ORAL
  Filled 2019-04-27 (×2): qty 1

## 2019-04-27 MED ORDER — PHENYLEPHRINE 40 MCG/ML (10ML) SYRINGE FOR IV PUSH (FOR BLOOD PRESSURE SUPPORT)
PREFILLED_SYRINGE | INTRAVENOUS | Status: AC
  Filled 2019-04-27: qty 10

## 2019-04-27 MED ORDER — ALPRAZOLAM 0.5 MG PO TABS: 1.0000 mg | ORAL_TABLET | Freq: Two times a day (BID) | ORAL | Status: DC | PRN

## 2019-04-27 MED ORDER — ALUM & MAG HYDROXIDE-SIMETH 200-200-20 MG/5ML PO SUSP: 15.0000 mL | ORAL | Status: DC | PRN

## 2019-04-27 MED ORDER — SODIUM CHLORIDE 0.9 % IV SOLN: INTRAVENOUS | Status: DC

## 2019-04-27 MED ORDER — DEXAMETHASONE SODIUM PHOSPHATE 10 MG/ML IJ SOLN
INTRAMUSCULAR | Status: AC
  Filled 2019-04-27: qty 3

## 2019-04-27 MED ORDER — SODIUM CHLORIDE 0.9 % IR SOLN
Status: DC | PRN
  Administered 2019-04-27: 3000 mL

## 2019-04-27 MED ORDER — ONDANSETRON HCL 4 MG/2ML IJ SOLN: 4.0000 mg | Freq: Four times a day (QID) | INTRAMUSCULAR | Status: DC | PRN

## 2019-04-27 MED ORDER — OXYCODONE HCL 5 MG/5ML PO SOLN: 5.0000 mg | Freq: Once | ORAL | Status: DC | PRN

## 2019-04-27 MED ORDER — PHENYLEPHRINE 40 MCG/ML (10ML) SYRINGE FOR IV PUSH (FOR BLOOD PRESSURE SUPPORT)
PREFILLED_SYRINGE | INTRAVENOUS | Status: AC
  Filled 2019-04-27: qty 30

## 2019-04-27 MED ORDER — LIDOCAINE 2% (20 MG/ML) 5 ML SYRINGE
INTRAMUSCULAR | Status: AC
  Filled 2019-04-27: qty 15

## 2019-04-27 MED ORDER — AMPHETAMINE-DEXTROAMPHETAMINE 10 MG PO TABS
30.0000 mg | ORAL_TABLET | Freq: Two times a day (BID) | ORAL | Status: DC
  Administered 2019-04-28 – 2019-04-29 (×3): 30 mg via ORAL
  Filled 2019-04-27 (×3): qty 3

## 2019-04-27 MED ORDER — OXYCODONE HCL 5 MG PO TABS: 5.0000 mg | ORAL_TABLET | Freq: Once | ORAL | Status: DC | PRN

## 2019-04-27 MED ORDER — HYDRALAZINE HCL 20 MG/ML IJ SOLN: 5.0000 mg | INTRAMUSCULAR | Status: DC | PRN

## 2019-04-27 MED ORDER — CYCLOSPORINE 0.05 % OP EMUL
1.0000 [drp] | Freq: Two times a day (BID) | OPHTHALMIC | Status: DC
  Administered 2019-04-28 – 2019-04-29 (×3): 1 [drp] via OPHTHALMIC
  Filled 2019-04-27 (×5): qty 30

## 2019-04-27 MED ORDER — POTASSIUM CHLORIDE ER 10 MEQ PO TBCR
10.0000 meq | EXTENDED_RELEASE_TABLET | Freq: Three times a day (TID) | ORAL | Status: DC
  Administered 2019-04-27: 10 meq via ORAL
  Filled 2019-04-27 (×3): qty 1

## 2019-04-27 MED ORDER — ADULT MULTIVITAMIN W/MINERALS CH
1.0000 | ORAL_TABLET | Freq: Every day | ORAL | Status: DC
  Administered 2019-04-28 – 2019-04-29 (×2): 1 via ORAL
  Filled 2019-04-27 (×2): qty 1

## 2019-04-27 MED ORDER — GUAIFENESIN-DM 100-10 MG/5ML PO SYRP: 15.0000 mL | ORAL_SOLUTION | ORAL | Status: DC | PRN

## 2019-04-27 MED ORDER — POTASSIUM CHLORIDE CRYS ER 20 MEQ PO TBCR
20.0000 meq | EXTENDED_RELEASE_TABLET | Freq: Once | ORAL | Status: DC
  Filled 2019-04-27: qty 2

## 2019-04-27 MED ORDER — SODIUM CHLORIDE 0.9 % IV SOLN
INTRAVENOUS | Status: DC
  Administered 2019-04-27 – 2019-04-28 (×2): via INTRAVENOUS

## 2019-04-27 MED ORDER — MAGNESIUM OXIDE 400 (241.3 MG) MG PO TABS
200.0000 mg | ORAL_TABLET | Freq: Every day | ORAL | Status: DC
  Administered 2019-04-28 – 2019-04-29 (×2): 200 mg via ORAL
  Filled 2019-04-27 (×2): qty 1

## 2019-04-27 MED ORDER — OLOPATADINE HCL 0.1 % OP SOLN
1.0000 [drp] | Freq: Every day | OPHTHALMIC | Status: DC
  Administered 2019-04-28 – 2019-04-29 (×2): 1 [drp] via OPHTHALMIC
  Filled 2019-04-27: qty 5

## 2019-04-27 MED ORDER — PROPOFOL 10 MG/ML IV BOLUS
INTRAVENOUS | Status: DC | PRN
  Administered 2019-04-27: 100 mg via INTRAVENOUS

## 2019-04-27 MED ORDER — DULOXETINE HCL 60 MG PO CPEP
60.0000 mg | ORAL_CAPSULE | Freq: Two times a day (BID) | ORAL | Status: DC
  Administered 2019-04-27 – 2019-04-29 (×4): 60 mg via ORAL
  Filled 2019-04-27 (×4): qty 1

## 2019-04-27 MED ORDER — HYDROCODONE-ACETAMINOPHEN 10-325 MG PO TABS: 1.0000 | ORAL_TABLET | Freq: Four times a day (QID) | ORAL | Status: DC | PRN

## 2019-04-27 SURGICAL SUPPLY — 25 items
CANISTER SUCT 3000ML PPV (MISCELLANEOUS) ×3 IMPLANT
CANISTER WOUND CARE 500ML ATS (WOUND CARE) ×1 IMPLANT
COVER SURGICAL LIGHT HANDLE (MISCELLANEOUS) ×2 IMPLANT
COVER WAND RF STERILE (DRAPES) ×2 IMPLANT
DRSG VAC ATS LRG SENSATRAC (GAUZE/BANDAGES/DRESSINGS) IMPLANT
DRSG VAC ATS MED SENSATRAC (GAUZE/BANDAGES/DRESSINGS) IMPLANT
DRSG VAC ATS SM SENSATRAC (GAUZE/BANDAGES/DRESSINGS) ×1 IMPLANT
ELECT REM PT RETURN 9FT ADLT (ELECTROSURGICAL) ×2
ELECTRODE REM PT RTRN 9FT ADLT (ELECTROSURGICAL) ×1 IMPLANT
GLOVE BIO SURGEON STRL SZ7.5 (GLOVE) ×2 IMPLANT
GOWN STRL REUS W/ TWL LRG LVL3 (GOWN DISPOSABLE) ×3 IMPLANT
GOWN STRL REUS W/ TWL XL LVL3 (GOWN DISPOSABLE) ×1 IMPLANT
GOWN STRL REUS W/TWL LRG LVL3 (GOWN DISPOSABLE) ×6
GOWN STRL REUS W/TWL XL LVL3 (GOWN DISPOSABLE) ×2
HANDPIECE INTERPULSE COAX TIP (DISPOSABLE) ×2
KIT BASIN OR (CUSTOM PROCEDURE TRAY) ×2 IMPLANT
KIT TURNOVER KIT B (KITS) ×2 IMPLANT
NS IRRIG 1000ML POUR BTL (IV SOLUTION) ×2 IMPLANT
PACK GENERAL/GYN (CUSTOM PROCEDURE TRAY) ×2 IMPLANT
PACK UNIVERSAL I (CUSTOM PROCEDURE TRAY) IMPLANT
PAD ARMBOARD 7.5X6 YLW CONV (MISCELLANEOUS) ×4 IMPLANT
PAD NEG PRESSURE SENSATRAC (MISCELLANEOUS) ×2 IMPLANT
SET HNDPC FAN SPRY TIP SCT (DISPOSABLE) IMPLANT
TOWEL GREEN STERILE (TOWEL DISPOSABLE) ×2 IMPLANT
WATER STERILE IRR 1000ML POUR (IV SOLUTION) IMPLANT

## 2019-04-27 NOTE — Transfer of Care (Signed)
Immediate Anesthesia Transfer of Care Note  Patient: Jane [name]  Procedure(s) Performed: [name] DEBRIDEMENT (Left Groin) APPLICATION OF WOUND VAC LEFT GROIN (Left Groin)  Patient Location: PACU  Anesthesia Type:General  Level of Consciousness: drowsy and patient cooperative  Airway & Oxygen Therapy: Patient Spontanous Breathing and Patient connected to face mask oxygen  Post-op Assessment: Report given to RN and Post -op Vital signs reviewed and stable  Post vital signs: Reviewed and stable  Last Vitals:  Vitals Value Taken Time  BP 147/71 04/27/19 1534  Temp    Pulse 89 04/27/19 1537  Resp 12 04/27/19 1537  SpO2 100 % 04/27/19 1537  Vitals shown include unvalidated device data.  Last Pain:  Vitals:   04/27/19 0944  TempSrc: Oral  PainSc: 9       Patients Stated Pain Goal: 4 (06/99/96 7227)  Complications: No apparent anesthesia complications

## 2019-04-27 NOTE — Anesthesia Postprocedure Evaluation (Signed)
Anesthesia Post Note  Patient: Jane [name]  Procedure(s) Performed: [name] DEBRIDEMENT (Left Groin) APPLICATION OF WOUND VAC LEFT GROIN (Left Groin)     Patient location during evaluation: PACU Anesthesia Type: General Level of consciousness: awake and alert Pain management: pain level controlled Vital Signs Assessment: post-procedure vital signs reviewed and stable Respiratory status: spontaneous breathing, nonlabored ventilation, respiratory function stable and patient connected to nasal cannula oxygen Cardiovascular status: blood pressure returned to baseline and stable Postop Assessment: no apparent nausea or vomiting Anesthetic complications: no    Last Vitals:  Vitals:   04/27/19 1545 04/27/19 1549  BP:  (!) 148/70  Pulse: 93 86  Resp: 17 13  Temp:    SpO2: 100% 100%    Last Pain:  Vitals:   04/27/19 1534  TempSrc:   PainSc: Asleep                 Signa Cheek

## 2019-04-27 NOTE — H&P (Signed)
   History and Physical Update  The patient was interviewed and re-examined.  The patient's previous History and Physical has been reviewed and is unchanged from recent office visit. Plan for OR today for debridement and possible vac placement.   Rolinda Impson C. Donzetta Matters, MD [facility] [place] Office: [phone] Pager: [phone]   04/27/2019, 9:32 AM

## 2019-04-27 NOTE — Anesthesia Preprocedure Evaluation (Signed)
Anesthesia Evaluation  Patient identified by MRN, date of birth, ID band Patient awake    Reviewed: Allergy & Precautions, NPO status , Patient's Chart, lab work & pertinent test results  History of Anesthesia Complications Negative for: history of anesthetic complications  Airway Mallampati: III  TM Distance: >3 FB Neck ROM: Full    Dental  (+) Dental Advisory Given, Teeth Intact   Pulmonary shortness of breath and with exertion,    Pulmonary exam normal        Cardiovascular hypertension, Pt. on medications + Peripheral Vascular Disease and + DVT  Normal cardiovascular exam   May-Thurner Syndrome    Neuro/Psych  Headaches, PSYCHIATRIC DISORDERS Anxiety Depression Bipolar Disorder  OCD  RLS     GI/Hepatic Neg liver ROS, GERD  Medicated and Controlled,  Endo/Other  diabetes, Type 2, Oral Hypoglycemic AgentsHypothyroidism Morbid obesity  Renal/GU CRFRenal disease     Musculoskeletal negative musculoskeletal ROS (+)   Abdominal (+) + obese,   Peds  Hematology  (+) anemia ,   Anesthesia Other Findings Denies recent change in health  Reproductive/Obstetrics                             Anesthesia Physical  Anesthesia Plan  ASA: III  Anesthesia Plan: General   Post-op Pain Management:    Induction: Intravenous  PONV Risk Score and Plan: 3 and Treatment may vary due to age or medical condition, Ondansetron, Dexamethasone and Midazolam  Airway Management Planned: LMA  Additional Equipment: None  Intra-op Plan:   Post-operative Plan: Extubation in OR  Informed Consent: I have reviewed the patients History and Physical, chart, labs and discussed the procedure including the risks, benefits and alternatives for the proposed anesthesia with the patient or authorized representative who has indicated his/her understanding and acceptance.     Dental advisory given  Plan  Discussed with:   Anesthesia Plan Comments: ( )        Anesthesia Quick Evaluation

## 2019-04-27 NOTE — Progress Notes (Signed)
Tray ordered for patient.

## 2019-04-27 NOTE — Progress Notes (Signed)
Report attempted x 1

## 2019-04-27 NOTE — Anesthesia Procedure Notes (Signed)
Procedure Name: LMA Insertion Date/Time: 04/27/2019 3:02 PM Performed by: Kathryne Hitch, CRNA Pre-anesthesia Checklist: Patient identified, Emergency Drugs available, Suction available and Patient being monitored Patient Re-evaluated:Patient Re-evaluated prior to induction Oxygen Delivery Method: Circle system utilized Preoxygenation: Pre-oxygenation with 100% oxygen Induction Type: IV induction LMA: LMA inserted LMA Size: 4.0 Number of attempts: 1 Placement Confirmation: breath sounds checked- equal and bilateral Tube secured with: Tape Dental Injury: Teeth and Oropharynx as per pre-operative assessment

## 2019-04-27 NOTE — Op Note (Signed)
    Patient name: Jane [name] MRN: [id number] DOB: [birth year] Sex: female  04/27/2019 Pre-operative Diagnosis: Left groin postsurgical wound Post-operative diagnosis:  Same Surgeon:  Erlene Quan C. Donzetta Matters, MD Procedure Performed: 1.  Irrigation debridement left groin postsurgical wound to 8 x 3 x 4 cm deep 2.  Placement of negative pressure dressing left groin wound  Indications: 53 year old female with May Thurner syndrome recent underwent stenting of her left common external iliac veins.  Her edema has improved however she has persistent drainage and foul smell from the left groin.  She has been placed on antibiotics.  She is indicated for I&D of the wound.  Findings: There was significant turbid appearing fluid in the left groin.  This was washed out with pulse evacuation.  I cannot identify any of her common femoral vein or exposed stent.  Negative pressure dressing was placed.   Procedure:  The patient was identified in the holding area and taken to the operating room where she is placed supine operative when general anesthesia induced.  She was sterilely prepped draped left groin usual fashion antibiotics were administered and timeout called.  We began with opening the previous incision.  Aerobic anaerobic cultures were sent.  We then opened up to almost the full extent of the incision and debrided fibrinous exudate with 10 blade.  Pulse evacuation was used to a total of 3 L of irrigation.  We then had cleaned up the wound well.  I cannot identify the common femoral vein of the stent within the wound where it was previously exposed.  I placed a small wound VAC sponge fashioned a wound vacuum to suction.  She was then awakened anesthesia having tolerated procedure without immediate complication.  Specimen: Aerobic anaerobic cultures  EBL: 20 cc   Suzette Flagler C. Donzetta Matters, MD [facility] [place] Office: [phone] Pager: [phone]

## 2019-04-28 ENCOUNTER — Encounter (HOSPITAL_COMMUNITY): Payer: Self-pay | Admitting: Vascular Surgery

## 2019-04-28 DIAGNOSIS — I871 Compression of vein: Secondary | ICD-10-CM | POA: Diagnosis present

## 2019-04-28 DIAGNOSIS — N183 Chronic kidney disease, stage 3 (moderate): Secondary | ICD-10-CM | POA: Diagnosis present

## 2019-04-28 DIAGNOSIS — E039 Hypothyroidism, unspecified: Secondary | ICD-10-CM | POA: Diagnosis present

## 2019-04-28 DIAGNOSIS — D631 Anemia in chronic kidney disease: Secondary | ICD-10-CM | POA: Diagnosis present

## 2019-04-28 DIAGNOSIS — K219 Gastro-esophageal reflux disease without esophagitis: Secondary | ICD-10-CM | POA: Diagnosis present

## 2019-04-28 DIAGNOSIS — Y838 Other surgical procedures as the cause of abnormal reaction of the patient, or of later complication, without mention of misadventure at the time of the procedure: Secondary | ICD-10-CM | POA: Diagnosis present

## 2019-04-28 DIAGNOSIS — Z1159 Encounter for screening for other viral diseases: Secondary | ICD-10-CM | POA: Diagnosis not present

## 2019-04-28 DIAGNOSIS — Z86718 Personal history of other venous thrombosis and embolism: Secondary | ICD-10-CM | POA: Diagnosis not present

## 2019-04-28 DIAGNOSIS — F319 Bipolar disorder, unspecified: Secondary | ICD-10-CM | POA: Diagnosis present

## 2019-04-28 DIAGNOSIS — G2581 Restless legs syndrome: Secondary | ICD-10-CM | POA: Diagnosis present

## 2019-04-28 DIAGNOSIS — E1122 Type 2 diabetes mellitus with diabetic chronic kidney disease: Secondary | ICD-10-CM | POA: Diagnosis present

## 2019-04-28 DIAGNOSIS — T8141XA Infection following a procedure, superficial incisional surgical site, initial encounter: Secondary | ICD-10-CM | POA: Diagnosis present

## 2019-04-28 DIAGNOSIS — E1151 Type 2 diabetes mellitus with diabetic peripheral angiopathy without gangrene: Secondary | ICD-10-CM | POA: Diagnosis present

## 2019-04-28 DIAGNOSIS — B9620 Unspecified Escherichia coli [E. coli] as the cause of diseases classified elsewhere: Secondary | ICD-10-CM | POA: Diagnosis present

## 2019-04-28 DIAGNOSIS — Z888 Allergy status to other drugs, medicaments and biological substances status: Secondary | ICD-10-CM | POA: Diagnosis not present

## 2019-04-28 DIAGNOSIS — I129 Hypertensive chronic kidney disease with stage 1 through stage 4 chronic kidney disease, or unspecified chronic kidney disease: Secondary | ICD-10-CM | POA: Diagnosis present

## 2019-04-28 DIAGNOSIS — F429 Obsessive-compulsive disorder, unspecified: Secondary | ICD-10-CM | POA: Diagnosis present

## 2019-04-28 DIAGNOSIS — B952 Enterococcus as the cause of diseases classified elsewhere: Secondary | ICD-10-CM | POA: Diagnosis present

## 2019-04-28 DIAGNOSIS — R229 Localized swelling, mass and lump, unspecified: Secondary | ICD-10-CM | POA: Diagnosis present

## 2019-04-28 DIAGNOSIS — E7800 Pure hypercholesterolemia, unspecified: Secondary | ICD-10-CM | POA: Diagnosis present

## 2019-04-28 LAB — GLUCOSE, CAPILLARY
Glucose-Capillary: 167 mg/dL — ABNORMAL HIGH (ref 70–99)
Glucose-Capillary: 182 mg/dL — ABNORMAL HIGH (ref 70–99)
Glucose-Capillary: 165 mg/dL — ABNORMAL HIGH (ref 70–99)
Glucose-Capillary: 156 mg/dL — ABNORMAL HIGH (ref 70–99)
Glucose-Capillary: 133 mg/dL — ABNORMAL HIGH (ref 70–99)

## 2019-04-28 LAB — BASIC METABOLIC PANEL
Anion gap: 10 (ref 5–15)
BUN: 29 mg/dL — ABNORMAL HIGH (ref 6–20)
CO2: 24 mmol/L (ref 22–32)
Calcium: 8.7 mg/dL — ABNORMAL LOW (ref 8.9–10.3)
Chloride: 105 mmol/L (ref 98–111)
Creatinine, Ser: 1.7 mg/dL — ABNORMAL HIGH (ref 0.44–1.00)
GFR calc Af Amer: 39 mL/min — ABNORMAL LOW (ref >60)
GFR calc non Af Amer: 34 mL/min — ABNORMAL LOW (ref >60)
Glucose, Bld: 186 mg/dL — ABNORMAL HIGH (ref 70–99)
Potassium: 4.9 mmol/L (ref 3.5–5.1)
Sodium: 139 mmol/L (ref 135–145)

## 2019-04-28 LAB — HIV ANTIBODY (ROUTINE TESTING W REFLEX): HIV Screen 4th Generation wRfx: NONREACTIVE

## 2019-04-28 LAB — CBC
HCT: 25.2 % — ABNORMAL LOW (ref 36.0–46.0)
Hemoglobin: 7.4 g/dL — ABNORMAL LOW (ref 12.0–15.0)
MCH: 25.3 pg — ABNORMAL LOW (ref 26.0–34.0)
MCHC: 29.4 g/dL — ABNORMAL LOW (ref 30.0–36.0)
MCV: 86 fL (ref 80.0–100.0)
Platelets: 210 10*3/uL (ref 150–400)
RBC: 2.93 MIL/uL — ABNORMAL LOW (ref 3.87–5.11)
RDW: 15.8 % — ABNORMAL HIGH (ref 11.5–15.5)
WBC: 10.6 10*3/uL — ABNORMAL HIGH (ref 4.0–10.5)
nRBC: 0 % (ref 0.0–0.2)

## 2019-04-28 MED ORDER — GLUCERNA SHAKE PO LIQD
237.0000 mL | Freq: Two times a day (BID) | ORAL | Status: DC
  Administered 2019-04-28 – 2019-04-29 (×3): 237 mL via ORAL

## 2019-04-28 MED ORDER — INSULIN ASPART 100 UNIT/ML ~~LOC~~ SOLN
0.0000 [IU] | Freq: Three times a day (TID) | SUBCUTANEOUS | Status: DC
  Administered 2019-04-28 (×2): 3 [IU] via SUBCUTANEOUS

## 2019-04-28 NOTE — Progress Notes (Signed)
Initial Nutrition Assessment  DOCUMENTATION CODES:   Obesity unspecified  INTERVENTION:    Add Glucerna Shake po BID, each supplement provides 220 kcal and 10 grams of protein  Continue MVI daily  NUTRITION DIAGNOSIS:   Increased nutrient needs related to post-op healing as evidenced by estimated needs.  GOAL:   Patient will meet greater than or equal to 90% of their needs  MONITOR:   Supplement acceptance, PO intake, Skin, Weight trends, Labs, I & O's  REASON FOR ASSESSMENT:   Malnutrition Screening Tool    ASSESSMENT:   Patient with PMH significant for DVT, depression, Bipolar disorder, HTN, CKD III, DM, and neuropathy.  Presents this admission with left groin postsurgical wound after recent left to right femoral to femoral vein bypass.   6/23- I&D left groin, VAC placed   RD working remotely.  Spoke with pt via phone. Plan for wound VAC replacement tomorrow. She denies having loss in appetite PTA. States she has been trying to eat "a little better" but continues to eat three meals daily. Meal completions charted this admission as 100% for pt's last meal. Discussed the importance of protein intake to promote post-op healing. Pt amendable to Glucerna (does not want Juven).   Pt endorses a UBW of 275 lb and denies unintentional wt loss. Records indicate pt's weight has fluctuated from 265-280 lb over the last year.   Medications: Vit D, glipizide, SS novolog, Mag-ox, MVI with minerals, Vit C 500 mg daily  Labs: CBG 61-167    Diet Order:   Diet Order            Diet Heart Room service appropriate? Yes; Fluid consistency: Thin  Diet effective now              EDUCATION NEEDS:   Education needs have been addressed  Skin:  Skin Assessment: Skin Integrity Issues: Skin Integrity Issues:: Incisions, Wound VAC Wound Vac: left groin- removed this am, likely to be replaced Incisions: left groin  Last BM:  6/22  Height:   Ht Readings from Last 1 Encounters:   04/27/19 5\' 10"  (1.778 m)    Weight:   Wt Readings from Last 1 Encounters:  04/27/19 126.8 kg    Ideal Body Weight:  68.2 kg  BMI:  Body mass index is 40.1 kg/m.  Estimated Nutritional Needs:   Kcal:  1700-1900 kcal  Protein:  85-100 grams  Fluid:  >/= 1.7 L/day   Mariana Single RD, LDN Clinical Nutrition Pager # - [phone]

## 2019-04-28 NOTE — Progress Notes (Signed)
Removed patient's IV due to infiltration, patient has refused new IV because she believes she will be discharged tomorrow. Patient also understands she cannot received ordered IV maintenance fluids without an IV. Explained to patient, if she is not discharged tomorrow she will likely need IV placement. Will continue to monitor.

## 2019-04-28 NOTE — Progress Notes (Addendum)
  Progress Note    04/28/2019 7:45 AM 1 Day Post-Op  Subjective:  No complaints  Afebrile HR 60's-90's NSR 841'L-244'W systolic 10% RA  Vitals:   04/28/19 0239 04/28/19 0242  BP: 131/60 131/60  Pulse: 74 66  Resp: 15 11  Temp: 97.6 F (36.4 C)   SpO2: 98% 96%    Physical Exam: General:  No distress Lungs:  Non labored Incisions:  Left groin packing removed-no evidence of infection.  Tissue looks good.    CBC    Component Value Date/Time   WBC 10.6 (H) 04/28/2019 0423   RBC 2.93 (L) 04/28/2019 0423   HGB 7.4 (L) 04/28/2019 0423   HCT 25.2 (L) 04/28/2019 0423   PLT 210 04/28/2019 0423   MCV 86.0 04/28/2019 0423   MCH 25.3 (L) 04/28/2019 0423   MCHC 29.4 (L) 04/28/2019 0423   RDW 15.8 (H) 04/28/2019 0423    BMET    Component Value Date/Time   NA 139 04/28/2019 0423   K 4.9 04/28/2019 0423   CL 105 04/28/2019 0423   CO2 24 04/28/2019 0423   GLUCOSE 186 (H) 04/28/2019 0423   BUN 29 (H) 04/28/2019 0423   CREATININE 1.70 (H) 04/28/2019 0423   CALCIUM 8.7 (L) 04/28/2019 0423   GFRNONAA 34 (L) 04/28/2019 0423   GFRAA 39 (L) 04/28/2019 0423    INR    Component Value Date/Time   INR 1.8 (H) 04/27/2019 0954     Intake/Output Summary (Last 24 hours) at 04/28/2019 0745 Last data filed at 04/28/2019 0609 Gross per 24 hour  Intake 1130 ml  Output 1450 ml  Net -320 ml     Assessment:  53 y.o. female is s/p:  1.  Irrigation debridement left groin postsurgical wound to 8 x 3 x 4 cm deep 2.  Placement of negative pressure dressing left groin wound  1 Day Post-Op  Plan: -left groin wound looks good without evidence of infection.  Wound vac taken off due to "leaking".  I packed the wound back with wet to dry dressing-Dr. Donzetta Matters to evaluate and most likely will put wound vac back on wound.   -anemia-stable from previous visit-tolerating -K+ is 4.9 (up from 4.0) - receiving K-dur 27mEq tid-will dc for now.  Check labs in am -creatinine 1.7 this am, down from  1.96 -DVT prophylaxis:  Xarelto   Leontine Locket, PA-C [facility] [phone] 04/28/2019 7:45 AM  I have independently interviewed and examined patient and agree with PA assessment and plan above.  Wound was repacked at bedside appears to be clean.  Follow-up cultures to tailor antibiotics currently on Bactrim.  Continue Xarelto and aspirin.  Recheck labs tomorrow.  Plan for placement of wound VAC and home in the next day or 2.  Graison Leinberger C. Donzetta Matters, MD [facility] [place] Office: [phone] Pager: [phone]

## 2019-04-28 NOTE — Progress Notes (Signed)
Patient received via stretcher from PACU. With R.N.. Patient is alert and orin. R.N. into to do assessment. Orin. patient to room. V.S.S. Dressing change by Jeral Fruit.   Previous dressing bleed threw.Marland Kitchen

## 2019-04-28 NOTE — Care Management (Addendum)
Spoke w patient, she states she is active w [facility] for [employer]. She explains that she is to have her VAC replaced tomorrow and her plan is to discharge with it. Fortino Sic is checking which office she is active with [phone]. Patient is not active w [place]. Poke w her again, she stated it may be [facility]. Spoke w [facility], they do not have her active either. Patient then thought it was [facility], when checked with [facility] they said it went to [facility]. Checking w [place]. She is active w [facility] per Noland Fordyce [facility] RN, notified Durene Fruits [facility] that patient is admitted.   Order for [facility], [facility], on front of chart, needs signed by MD.     She also has [facility] 7 days a week 20 hours a week.

## 2019-04-28 NOTE — Discharge Instructions (Signed)

## 2019-04-29 LAB — BASIC METABOLIC PANEL
Anion gap: 9 (ref 5–15)
BUN: 35 mg/dL — ABNORMAL HIGH (ref 6–20)
CO2: 27 mmol/L (ref 22–32)
Calcium: 8.7 mg/dL — ABNORMAL LOW (ref 8.9–10.3)
Chloride: 103 mmol/L (ref 98–111)
Creatinine, Ser: 1.47 mg/dL — ABNORMAL HIGH (ref 0.44–1.00)
GFR calc Af Amer: 47 mL/min — ABNORMAL LOW (ref >60)
GFR calc non Af Amer: 41 mL/min — ABNORMAL LOW (ref >60)
Glucose, Bld: 131 mg/dL — ABNORMAL HIGH (ref 70–99)
Potassium: 4.3 mmol/L (ref 3.5–5.1)
Sodium: 139 mmol/L (ref 135–145)

## 2019-04-29 LAB — GLUCOSE, CAPILLARY
Glucose-Capillary: 88 mg/dL (ref 70–99)
Glucose-Capillary: 107 mg/dL — ABNORMAL HIGH (ref 70–99)
Glucose-Capillary: 77 mg/dL (ref 70–99)

## 2019-04-29 MED ORDER — AMOXICILLIN-POT CLAVULANATE 875-125 MG PO TABS: 1.0000 | ORAL_TABLET | Freq: Two times a day (BID) | ORAL | 0 refills | Status: AC

## 2019-04-29 MED ORDER — SULFAMETHOXAZOLE-TRIMETHOPRIM 400-80 MG PO TABS: 1.0000 | ORAL_TABLET | Freq: Two times a day (BID) | ORAL | 0 refills | Status: DC

## 2019-04-29 NOTE — Discharge Summary (Addendum)
Discharge Summary    Geneveive Furness 21-May-1966 53 y.o. female  [id number]  Admission Date: 04/27/2019  Discharge Date: 04/29/2019  Physician: Thomes Lolling*  Admission Diagnosis: right groin infection   HPI:   This is a 53 y.o. female status post above procedure.  Unfortunately she now has serous drainage from her groin although does not appear infected we will start her on antibiotics given the exposed stent in the incision.  We will plan to take her to the operating room for opening of the incision with likely placement of wound VAC.  I want her to continue her blood thinners throughout this operation without holding them given her recent stenting.  She demonstrates good understanding we will get her set up for next week.  Hospital Course:  The patient was admitted to the hospital and taken to the operating room on 04/27/2019 and underwent: 1.  Irrigation debridement left groin postsurgical wound to 8 x 3 x 4 cm deep 2.  Placement of negative pressure dressing left groin wound    Findings: There was significant turbid appearing fluid in the left groin.  This was washed out with pulse evacuation.  I cannot identify any of her common femoral vein or exposed stent.  Negative pressure dressing was placed.  The pt tolerated the procedure well and was transported to the PACU in good condition.   By POD 1, her wound was clean without evidence of infection.  Her wound vac had been removed as the seal was leaking and wet to dry dressing placed.    On POD 2, dressing was changed and wound continued to look good.  Wound vac placed and pt discharged home.  cx grew out few E coli and enterococcus faecalis.  She is d/c'd on Augmentin per Dr. Donzetta Matters.  The remainder of the hospital course consisted of increasing mobilization and increasing intake of solids without difficulty.  CBC    Component Value Date/Time   WBC 10.6 (H) 04/28/2019 0423   RBC 2.93 (L) 04/28/2019 0423   HGB  7.4 (L) 04/28/2019 0423   HCT 25.2 (L) 04/28/2019 0423   PLT 210 04/28/2019 0423   MCV 86.0 04/28/2019 0423   MCH 25.3 (L) 04/28/2019 0423   MCHC 29.4 (L) 04/28/2019 0423   RDW 15.8 (H) 04/28/2019 0423    BMET    Component Value Date/Time   NA 139 04/29/2019 0418   K 4.3 04/29/2019 0418   CL 103 04/29/2019 0418   CO2 27 04/29/2019 0418   GLUCOSE 131 (H) 04/29/2019 0418   BUN 35 (H) 04/29/2019 0418   CREATININE 1.47 (H) 04/29/2019 0418   CALCIUM 8.7 (L) 04/29/2019 0418   GFRNONAA 41 (L) 04/29/2019 0418   GFRAA 47 (L) 04/29/2019 0418        Discharge Diagnosis:  right groin infection  Secondary Diagnosis: Patient Active Problem List   Diagnosis Date Noted  . Wound of left groin 04/27/2019  . Pressure injury of skin 04/02/2019  . Respiratory failure requiring intubation ([place])   . Dvt femoral (deep venous thrombosis) ([place]) 03/30/2019  . 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 Chronic venous insufficiency 11/21/2017  . Radicular low back pain 05/25/2014  . Depression, major, recurrent ([place]) 06/17/2013  . OCD (obsessive compulsive disorder) 11/20/2012  . 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 Bipolar 1 disorder ([place]) 09/25/2012  . DVT (deep venous thrombosis) ([place]) 08/18/2012   Past Medical History:  Diagnosis Date  . Anxiety   .  Bipolar disorder ([place])   . Chronic kidney disease    Stage 3 kidney disease;dx by Dr. Sinda Du.   . Constipation   . Depression   . Diabetes mellitus without complication ([place])   . Dyspnea    with exertion  . GERD (gastroesophageal reflux disease)   . Headache    migraines  . High cholesterol   . History of DVT (deep vein thrombosis)    left leg  . Hypertension    states under control with meds., has been on med. x 2 years  . Hypothyroidism   . Neuropathy   . Obsessive-compulsive disorder   . Peripheral vascular disease ([place])   . Restless leg syndrome   . Trigger thumb of left  hand 01/2018  . Trigger thumb of right hand      Allergies as of 04/29/2019      Reactions   Aripiprazole Other (See Comments)   BECOMES  VIOLENT   Seroquel [quetiapine Fumarate] Other (See Comments)   BECOMES VIOLENT   Chlorpromazine Other (See Comments)   SEVERE ANXIETY   Gabapentin Other (See Comments)   NIGHTMARES      Medication List    STOP taking these medications   oxyCODONE-acetaminophen 5-325 MG tablet Commonly known as: PERCOCET/ROXICET   sulfamethoxazole-trimethoprim 400-80 MG tablet Commonly known as: BACTRIM     TAKE these medications   ALPRAZolam 1 MG tablet Commonly known as: Xanax Take 1 tablet (1 mg total) by mouth 2 (two) times daily as needed for anxiety.   amoxicillin-clavulanate 875-125 MG tablet Commonly known as: Augmentin Take 1 tablet by mouth 2 (two) times daily for 7 days.   amphetamine-dextroamphetamine 30 MG tablet Commonly known as: Adderall Take 1 tablet by mouth 2 (two) times daily. Fill after 10/27/18   aspirin EC 81 MG tablet Take 81 mg by mouth daily.   atorvastatin 20 MG tablet Commonly known as: LIPITOR Take 20 mg by mouth daily.   cetirizine 10 MG tablet Commonly known as: ZYRTEC Take 10 mg by mouth daily.   cycloSPORINE 0.05 % ophthalmic emulsion Commonly known as: RESTASIS Place 1 drop into both eyes 2 (two) times daily.   DULoxetine 60 MG capsule Commonly known as: Cymbalta Take 1 capsule (60 mg total) by mouth 2 (two) times daily.   Fish Oil 1000 MG Caps Take 1,000 mg by mouth daily.   furosemide 40 MG tablet Commonly known as: LASIX Take 40 mg by mouth as needed for fluid.   glipiZIDE 5 MG 24 hr tablet Commonly known as: GLUCOTROL XL Take 5 mg by mouth daily with breakfast.   hydrochlorothiazide 25 MG tablet Commonly known as: HYDRODIURIL Take 25 mg by mouth daily.   HYDROcodone-acetaminophen 10-325 MG tablet Commonly known as: NORCO Take 1 tablet by mouth every 6 (six) hours as needed (for pain.).    levothyroxine 137 MCG tablet Commonly known as: SYNTHROID Take 137 mcg by mouth daily before breakfast.   linaclotide 145 MCG Caps capsule Commonly known as: LINZESS Take 145 mcg by mouth daily.   Magnesium 250 MG Tabs Take 250 mg by mouth daily.   Melatonin 10 MG Tbdp Take 10 mg by mouth at bedtime as needed (for sleep).   multivitamin with minerals Tabs tablet Take 1 tablet by mouth daily. One-A-Day Women's Multivitamin   pantoprazole 40 MG tablet Commonly known as: PROTONIX Take 40 mg by mouth daily.   [place] 0.7 % Soln Generic drug: Olopatadine HCl Place 1 drop into both eyes daily.  potassium chloride 10 MEQ tablet Commonly known as: K-DUR Take 10 mEq by mouth 3 (three) times daily.   pregabalin 100 MG capsule Commonly known as: LYRICA Take 100 mg by mouth 3 (three) times daily.   REFRESH OP Place 1 drop into both eyes 3 (three) times daily.   rivaroxaban 20 MG Tabs tablet Commonly known as: Xarelto Take 1 tablet (20 mg total) by mouth daily with supper.   rOPINIRole 2 MG tablet Commonly known as: REQUIP Take 2 mg by mouth 2 (two) times daily.   triamcinolone 0.1 % paste Commonly known as: KENALOG Use as directed 1 application in the mouth or throat 4 (four) times daily as needed (mouth ulcers).   vitamin C 500 MG tablet Commonly known as: ASCORBIC ACID Take 500 mg by mouth at bedtime.   Vitamin D3 125 MCG (5000 UT) Caps Take 5,000 Units by mouth at bedtime.       Prescriptions given: Bactrim one po bid #10 NR  Instructions: 1. Wound vac change T/T/S   Disposition: [facility] with [facility]  Patient's condition: is Good  Follow up: 1. Dr. Donzetta Matters in 2 weeks   Leontine Locket, PA-C [facility] [phone] 04/29/2019  11:57 AM

## 2019-04-29 NOTE — Progress Notes (Signed)
  Progress Note    04/29/2019 1:46 PM 2 Days Post-Op  Subjective no overnight issues, pain well controlled left groin  Vitals:   04/29/19 0841 04/29/19 1220  BP: 133/62 118/63  Pulse: 98 92  Resp: (!) 22 16  Temp: 98.3 F (36.8 C)   SpO2: 97% 96%    Physical Exam: Awake alert oriented Nonlabored respirations Left groin is clean dry intact without any foul smell or purulent drainage Left leg is edematous relative to the right but all compartments are soft  CBC    Component Value Date/Time   WBC 10.6 (H) 04/28/2019 0423   RBC 2.93 (L) 04/28/2019 0423   HGB 7.4 (L) 04/28/2019 0423   HCT 25.2 (L) 04/28/2019 0423   PLT 210 04/28/2019 0423   MCV 86.0 04/28/2019 0423   MCH 25.3 (L) 04/28/2019 0423   MCHC 29.4 (L) 04/28/2019 0423   RDW 15.8 (H) 04/28/2019 0423    BMET    Component Value Date/Time   NA 139 04/29/2019 0418   K 4.3 04/29/2019 0418   CL 103 04/29/2019 0418   CO2 27 04/29/2019 0418   GLUCOSE 131 (H) 04/29/2019 0418   BUN 35 (H) 04/29/2019 0418   CREATININE 1.47 (H) 04/29/2019 0418   CALCIUM 8.7 (L) 04/29/2019 0418   GFRNONAA 41 (L) 04/29/2019 0418   GFRAA 47 (L) 04/29/2019 0418    INR    Component Value Date/Time   INR 1.8 (H) 04/27/2019 0954     Intake/Output Summary (Last 24 hours) at 04/29/2019 1346 Last data filed at 04/28/2019 2013 Gross per 24 hour  Intake 240 ml  Output 700 ml  Net -460 ml     Assessment/plan:  53 y.o. female is I&D of left groin.  We have been doing wet-to-dry dressings.  Cultures have returned with enterococcus we will discharge on Augmentin.  Wound VAC to be placed for home and she will follow-up in few weeks for wound check.   Jane [name] C. Donzetta Matters, MD [facility] [place] Office: [phone] Pager: [phone]  04/29/2019 1:46 PM

## 2019-04-29 NOTE — TOC Transition Note (Addendum)
Transition of Care [facility]) - CM/SW Discharge Note Marvetta Gibbons RN, BSN Transitions of Care Unit 4E- RN Case Manager [phone]   Patient Details  Name: Jane [name] MRN: [id number] Date of Birth: 06-Oct-1966  Transition of Care [facility]) CM/SW Contact:  Dawayne Patricia, RN Phone Number: 04/29/2019, 2:44 PM   Clinical Narrative:    Pt stable for transition home today, orders placed for [facility] for home wound VAC needs, pt active with [facility]. Wound VAC form has been signed for [place] with [place] to process for approval- CM spoke with Butch Penny at [facility] who will plan to come to bedside to place [facility] along with Bedside RN once home [facility] approved.    Final next level of care: [place] Barriers to Discharge: No Barriers Identified   Patient Goals and CMS Choice Patient states their goals for this hospitalization and ongoing recovery are:: home CMS Medicare.gov Compare Post Acute Care list provided to:: Patient Choice offered to / list presented to : Patient  Discharge Placement  Home with [facility]                     Discharge Plan and Services   Discharge Planning Services: CM Consult Post Acute Care Choice: Home Health, Resumption of Svcs/PTA Provider          DME Arranged: Vac DME Agency: [employer] Date DME Agency Contacted: 04/29/19 Time DME Agency Contacted: 1000 Representative spoke with at DME Agency: [place]: RN [place] Agency: [place] ([place]) Date [place]: 04/29/19 Time [place]: 1030 Representative spoke with at [place]: Janae Sauce  Social Determinants of Health (SDOH) Interventions     Readmission Risk Interventions Readmission Risk Prevention Plan 04/29/2019 04/05/2019 04/03/2019  Transportation Screening Complete - Complete  PCP or Specialist Appt within 5-7 Days - Complete -  PCP or Specialist Appt within 3-5 Days Complete - Complete  Home Care Screening - Complete -   Medication Review (RN CM) - Complete -  [place] or Home Care Consult Complete - Complete  Social Work Consult for [place] Planning/Counseling Complete - Not Complete  SW consult not completed comments - - NA  Palliative Care Screening Not Applicable - Not Applicable  Medication Review (RN Care Manager) Complete - Referral to Pharmacy  Some recent data might be hidden

## 2019-05-01 LAB — AEROBIC/ANAEROBIC CULTURE W GRAM STAIN (SURGICAL/DEEP WOUND)

## 2019-05-02 ENCOUNTER — Telehealth (HOSPITAL_BASED_OUTPATIENT_CLINIC_OR_DEPARTMENT_OTHER): Payer: Self-pay | Admitting: Vascular Surgery

## 2019-05-02 NOTE — Telephone Encounter (Signed)
I received a page from the answering service at midnight.  His Gibby reported that she had a VAC change at around 4 PM.  Began having chills and subjectively fever.  No other symptoms.  Reviewed her record remotely.  She did have debridement and VAC placement on 623.  No evidence of prosthetic material exposed at that time.  Is on Augmentin.  Cultures at that time revealed enterococcus and E. coli both sensitive to Augmentin.  Will take Tylenol for fever and continue to monitor this.  No report of issues regarding her VAC change earlier in the day.  Will notify should she continue to have fevers.  Has follow-up arranged with Dr. Donzetta Matters

## 2019-05-04 ENCOUNTER — Other Ambulatory Visit: Payer: Self-pay

## 2019-05-04 DIAGNOSIS — I871 Compression of vein: Secondary | ICD-10-CM

## 2019-05-14 ENCOUNTER — Encounter: Payer: Self-pay | Admitting: Family

## 2019-05-14 ENCOUNTER — Other Ambulatory Visit: Payer: Self-pay

## 2019-05-14 ENCOUNTER — Ambulatory Visit (INDEPENDENT_AMBULATORY_CARE_PROVIDER_SITE_OTHER): Payer: Self-pay | Admitting: Family

## 2019-05-14 ENCOUNTER — Encounter: Payer: Medicaid Other | Admitting: Family

## 2019-05-14 VITALS — BP 139/71 | HR 86 | Temp 98.2°F | Resp 14 | Ht 70.0 in | Wt 278.0 lb

## 2019-05-14 DIAGNOSIS — I871 Compression of vein: Secondary | ICD-10-CM

## 2019-05-14 NOTE — Progress Notes (Signed)
[facility] [place]   CC: Follow up peripheral artery occlusive disease  History of Present Illness Jane [name] is a 53 y.o. female who is s/p Irrigation and debridement left groin postsurgical wound to 8 x 3 x 4 cm deep and placement of negative pressure dressing left groin wound on 04-27-19 by Dr. Donzetta Matters for left groin post surgical wound.   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   She is also s/p left common femoral vein to right common femoral vein bypass with cryopreserved greater saphenous vein 5.5 mm in diameter, and creation of left superficial femoral artery to left femoral vein AV fistula with interposition cryopreserved saphenous vein on 11-24-18 by Dr. 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  She is also s/p stenting of left common, external iliac veins and left common femoral vein, and patch angioplasty left common femoral vein on 03-30-19 by Dr. 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   She returns today for follow up.   Pt denies fever or chills. She states that she is not able to take off a left leg compression hose even if her aid or nurse could put it in for her. She also states that he left leg is less swollen than it had been.   HH has been attending to her left groin wound vac, and she states that she has a care aid attend to her daily.   Diabetic: Yes, 6.3 [name] on 03-26-19, in control  Tobacco use: non-smoker  Pt meds include: Statin :Yes Betablocker: No ASA: Yes Other anticoagulants/antiplatelets: Xarelto , hx of DVT left leg  Past Medical History:  Diagnosis Date  . Anxiety   . Bipolar disorder ([place])   . Chronic kidney disease    Stage 3 kidney disease;dx by Dr. Sinda Du.   . Constipation   . Depression   . Diabetes mellitus without complication ([place])   . Dyspnea    with exertion  . GERD (gastroesophageal reflux disease)   . Headache     migraines  . High cholesterol   . History of DVT (deep vein thrombosis)    left leg  . Hypertension    states under control with meds., has been on med. x 2 years  . Hypothyroidism   . Neuropathy   . Obsessive-compulsive disorder   . Peripheral vascular disease ([place])   . Restless leg syndrome   . Trigger thumb of left hand 01/2018  . Trigger thumb of right hand     Social History Social History   Tobacco Use  . Smoking status: Never Smoker  . Smokeless tobacco: Never Used  Substance Use Topics  . Alcohol use: No  . Drug use: No    Family History Family History  Problem Relation Age of Onset  . Heart disease Mother   . Hyperlipidemia Mother   . Hypertension Mother   . Bipolar disorder Mother   . Diabetes Father   . Heart disease Father   . Hyperlipidemia Father   . Hypertension Father   . Drug abuse Daughter   . ADD / ADHD Daughter   . Drug abuse Daughter   . Anxiety disorder Daughter   . Bipolar disorder Daughter   . Hypertension Sister   . Hypertension Brother   . Hyperlipidemia Brother   . Heart disease Brother   . Bipolar disorder Maternal Aunt   . Suicidality Maternal Aunt  Past Surgical History:  Procedure Laterality Date  . ABDOMINAL HYSTERECTOMY  06/2016   complete  . APPLICATION OF WOUND VAC Left 04/27/2019   Procedure: APPLICATION OF WOUND VAC LEFT GROIN;  Surgeon: Waynetta Sandy, MD;  Location: [place];  Service: Vascular;  Laterality: Left;  . AV FISTULA PLACEMENT Left 11/24/2018   Procedure: ARTERIOVENOUS (AV) FISTULA CREATION LEFT SFA TO LEFT FEMORAL VEIN;  Surgeon: Waynetta Sandy, MD;  Location: [place];  Service: Vascular;  Laterality: Left;  . CHOLECYSTECTOMY    . FEMORAL ARTERY EXPLORATION  03/30/2019   Procedure: Left Common Femoral Artery and Vein Exploration;  Surgeon: Waynetta Sandy, MD;  Location: [place];  Service: Vascular;;  . FEMORAL-FEMORAL BYPASS GRAFT Left 11/24/2018   Procedure: BYPASS GRAFT  FEMORAL-FEMORAL VENOUS LEFT TO RIGHT PALMA PROCEDURE USING CRYOVEIN;  Surgeon: Waynetta Sandy, MD;  Location: [place];  Service: Vascular;  Laterality: Left;  . GROIN DEBRIDEMENT Left 04/27/2019   Procedure: GROIN DEBRIDEMENT;  Surgeon: Waynetta Sandy, MD;  Location: [place];  Service: Vascular;  Laterality: Left;  . INSERTION OF ILIAC STENT  03/30/2019   Procedure: Stent of left common, external iliac veins and left common femoral vein;  Surgeon: Waynetta Sandy, MD;  Location: [place];  Service: Vascular;;  . LOWER EXTREMITY VENOGRAPHY N/A 08/17/2018   Procedure: LOWER EXTREMITY VENOGRAPHY - Central Venogram;  Surgeon: Waynetta Sandy, MD;  Location: [place] [facility];  Service: Cardiovascular;  Laterality: N/A;  . LOWER EXTREMITY VENOGRAPHY Bilateral 03/09/2019   Procedure: LOWER EXTREMITY VENOGRAPHY;  Surgeon: Waynetta Sandy, MD;  Location: [place] [facility];  Service: Cardiovascular;  Laterality: Bilateral;  . LUMBAR FUSION  11/21/2000   L5-S1  . LUMBAR SPINE SURGERY     x 2 others  . PATCH ANGIOPLASTY Left 03/30/2019   Procedure: Patch Angioplasty of the Left Common Femoral Vein using Venosure Biologic patch;  Surgeon: Waynetta Sandy, MD;  Location: [place];  Service: Vascular;  Laterality: Left;  . TRIGGER FINGER RELEASE Right 12/01/2017   Procedure: RELEASE TRIGGER FINGER/A-1 PULLEY RIGHT THUMB;  Surgeon: Leanora Cover, MD;  Location: [place];  Service: Orthopedics;  Laterality: Right;  . TRIGGER FINGER RELEASE Left 01/26/2018   Procedure: LEFT TRIGGER THUMB RELEASE;  Surgeon: Leanora Cover, MD;  Location: [place];  Service: Orthopedics;  Laterality: Left;  . ULTRASOUND GUIDANCE FOR VASCULAR ACCESS Right 03/30/2019   Procedure: Ultrasound-guided cannulation right internal jugular vein;  Surgeon: Waynetta Sandy, MD;  Location: [facility];  Service: Vascular;  Laterality: Right;    Allergies   Allergen Reactions  . Aripiprazole Other (See Comments)    BECOMES  VIOLENT   . Seroquel [Quetiapine Fumarate] Other (See Comments)    BECOMES VIOLENT  . Chlorpromazine Other (See Comments)    SEVERE ANXIETY   . Gabapentin Other (See Comments)    NIGHTMARES     Current Outpatient Medications  Medication Sig Dispense Refill  . ALPRAZolam (XANAX) 1 MG tablet Take 1 tablet (1 mg total) by mouth 2 (two) times daily as needed for anxiety. 60 tablet 2  . amphetamine-dextroamphetamine (ADDERALL) 30 MG tablet Take 1 tablet by mouth 2 (two) times daily. Fill after 10/27/18 60 tablet 0  . aspirin EC 81 MG tablet Take 81 mg by mouth daily.    Marland Kitchen atorvastatin (LIPITOR) 20 MG tablet Take 20 mg by mouth daily.    . cetirizine (ZYRTEC) 10 MG tablet Take 10 mg by mouth  daily.    . Cholecalciferol (VITAMIN D3) 125 MCG (5000 UT) CAPS Take 5,000 Units by mouth at bedtime.    . cycloSPORINE (RESTASIS) 0.05 % ophthalmic emulsion Place 1 drop into both eyes 2 (two) times daily.    . DULoxetine (CYMBALTA) 60 MG capsule Take 1 capsule (60 mg total) by mouth 2 (two) times daily. 60 capsule 2  . glipiZIDE (GLUCOTROL XL) 5 MG 24 hr tablet Take 5 mg by mouth daily with breakfast.    . hydrochlorothiazide (HYDRODIURIL) 25 MG tablet Take 25 mg by mouth daily.    Marland Kitchen HYDROcodone-acetaminophen (NORCO) 10-325 MG tablet Take 1 tablet by mouth every 6 (six) hours as needed (for pain.).    Marland Kitchen levothyroxine (SYNTHROID, LEVOTHROID) 137 MCG tablet Take 137 mcg by mouth daily before breakfast.    . linaclotide (LINZESS) 145 MCG CAPS capsule Take 145 mcg by mouth daily.     . Magnesium 250 MG TABS Take 250 mg by mouth daily.    . Multiple Vitamin (MULTIVITAMIN WITH MINERALS) TABS tablet Take 1 tablet by mouth daily. One-A-Day Women's Multivitamin    . Omega-3 Fatty Acids (FISH OIL) 1000 MG CAPS Take 1,000 mg by mouth daily.     . pantoprazole (PROTONIX) 40 MG tablet Take 40 mg by mouth daily.    Marland Kitchen PAZEO 0.7 % SOLN Place 1  drop into both eyes daily.     . potassium chloride (K-DUR) 10 MEQ tablet Take 10 mEq by mouth 3 (three) times daily.    . pregabalin (LYRICA) 100 MG capsule Take 100 mg by mouth 3 (three) times daily.    . rivaroxaban (XARELTO) 20 MG TABS tablet Take 1 tablet (20 mg total) by mouth daily with supper. 30 tablet 6  . rOPINIRole (REQUIP) 2 MG tablet Take 2 mg by mouth 2 (two) times daily.    . vitamin C (ASCORBIC ACID) 500 MG tablet Take 500 mg by mouth at bedtime.    . furosemide (LASIX) 40 MG tablet Take 40 mg by mouth as needed for fluid.     . Melatonin 10 MG TBDP Take 10 mg by mouth at bedtime as needed (for sleep).    . Polyvinyl Alcohol-Povidone (REFRESH OP) Place 1 drop into both eyes 3 (three) times daily.     Marland Kitchen triamcinolone (KENALOG) 0.1 % paste Use as directed 1 application in the mouth or throat 4 (four) times daily as needed (mouth ulcers).     No current facility-administered medications for this visit.     ROS: See HPI for pertinent positives and negatives.   Physical Examination  Vitals:   05/14/19 1508  BP: 139/71  Pulse: 86  Resp: 14  Temp: 98.2 F (36.8 C)  TempSrc: Temporal  SpO2: 95%  Weight: 278 lb (126.1 kg)  Height: 5\' 10"  (1.778 m)   Body mass index is 39.89 kg/m.  General: A&O x 3, WDWN, obese female. HEENT: No gross abnormalities. Large neck Pulmonary: Respirations are non labored, CTAB, good air movement in all fields Cardiac: regular rhythm and rate.  Radial pulses are 3+ palpable bilaterally   Adominal aortic pulse is not palpable                         VASCULAR EXAM: Extremities without ischemic changes, without Gangrene; without open wounds.  Left leg is 1.5 x larger than her right leg with 1+ pitting and non pitting edema.  Left groin wound looks clean, no drainage, healthy  appearing tissue with signs of granulating.                                                                                                         LE Pulses Right  Left       FEMORAL  2+ palpable  2+ palpable        POPLITEAL  not palpable   not palpable       POSTERIOR TIBIAL   palpable    palpable        DORSALIS PEDIS      ANTERIOR TIBIAL  palpable   palpable    Abdomen: soft, NT, no palpable masses. Skin: no rashes, no cellulitis, no ulcers noted. Musculoskeletal: no muscle wasting or atrophy.  Neurologic: A&O X 3; appropriate affect, Sensation is normal; MOTOR FUNCTION:  moving all extremities equally, motor strength 5/5 throughout. Speech is fluent/normal. CN 2-12 intact. Psychiatric: Thought content is anxious, not forthcoming.     ASSESSMENT: Jane [name] is a 53 y.o. female is s/p Irrigation and debridement left groin postsurgical wound to 8 x 3 x 4 cm deep and placement of negative pressure dressing left groin wound on 04-27-19 by Dr. Donzetta Matters for left groin post surgical wound.   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   She is also s/p left common femoral vein to right common femoral vein bypass with cryopreserved greater saphenous vein 5.5 mm in diameter, and creation of left superficial femoral artery to left femoral vein AV fistula with interposition cryopreserved saphenous vein on 11-24-18 by Dr. 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  She is also s/p stenting of left common, external iliac veins and left common femoral vein, and patch angioplasty left common femoral vein on 03-30-19 by Dr. 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   Her left groin wound appears not to have any drainage at this point, healthy viable tissue, wound is getting smaller. Left leg is not as large as it was previously. Pt states she is unable to put on or remove compression hose, even though she has a personal care aid or a nurse at her home daily.   Dr. Donzetta Matters spoke with and examined pt.  See Plan.   PLAN:  Follow up next week with venous duplex and see Dr. Donzetta Matters as  already scheduled.  HH to continue wound vac if still draining, otherwise dry gauze dressing in left groin  wound daily.   I discussed in depth with the patient the nature of atherosclerosis, and emphasized the importance of maximal medical management including strict control of blood pressure, blood glucose, and lipid levels, obtaining regular exercise, and continued cessation of smoking.  The patient is aware that without maximal medical management the underlying atherosclerotic disease process will progress, limiting the benefit of any interventions.  The patient was given information about PAD including signs, symptoms, treatment, what symptoms should prompt the patient to seek immediate medical care, and risk reduction measures to take.  Clemon Chambers, RN, MSN,  FNP-C [facility] Phone: [phone]  Clinic MD: Donzetta Matters  05/14/19 3:25 PM

## 2019-05-18 ENCOUNTER — Ambulatory Visit (INDEPENDENT_AMBULATORY_CARE_PROVIDER_SITE_OTHER): Payer: Medicaid Other | Admitting: Psychiatry

## 2019-05-18 ENCOUNTER — Encounter (HOSPITAL_COMMUNITY): Payer: Self-pay | Admitting: Psychiatry

## 2019-05-18 ENCOUNTER — Other Ambulatory Visit: Payer: Self-pay

## 2019-05-18 DIAGNOSIS — F3130 Bipolar disorder, current episode depressed, mild or moderate severity, unspecified: Secondary | ICD-10-CM | POA: Diagnosis not present

## 2019-05-18 MED ORDER — ALPRAZOLAM 1 MG PO TABS: 1.0000 mg | ORAL_TABLET | Freq: Two times a day (BID) | ORAL | 2 refills | Status: DC | PRN

## 2019-05-18 MED ORDER — DULOXETINE HCL 60 MG PO CPEP: 60.0000 mg | ORAL_CAPSULE | Freq: Two times a day (BID) | ORAL | 2 refills | Status: DC

## 2019-05-18 NOTE — Progress Notes (Deleted)
BH MD/PA/NP OP Progress Note  05/18/2019 1:55 PM Masako Overall  MRN:  [id number]  Chief Complaint:  Chief Complaint    Depression; Anxiety; Follow-up     HPI: *** Visit Diagnosis:    ICD-10-CM   1. Bipolar I disorder, most recent episode depressed ([place])  F31.30     Past Psychiatric History: ***  Past Medical History:  Past Medical History:  Diagnosis Date  . Anxiety   . Bipolar disorder ([place])   . Chronic kidney disease    Stage 3 kidney disease;dx by Dr. Sinda Du.   . Constipation   . Depression   . Diabetes mellitus without complication ([place])   . Dyspnea    with exertion  . GERD (gastroesophageal reflux disease)   . Headache    migraines  . High cholesterol   . History of DVT (deep vein thrombosis)    left leg  . Hypertension    states under control with meds., has been on med. x 2 years  . Hypothyroidism   . Neuropathy   . Obsessive-compulsive disorder   . Peripheral vascular disease ([place])   . Restless leg syndrome   . Trigger thumb of left hand 01/2018  . Trigger thumb of right hand     Past Surgical History:  Procedure Laterality Date  . ABDOMINAL HYSTERECTOMY  06/2016   complete  . APPLICATION OF WOUND VAC Left 04/27/2019   Procedure: APPLICATION OF WOUND VAC LEFT GROIN;  Surgeon: Waynetta Sandy, MD;  Location: [place];  Service: Vascular;  Laterality: Left;  . AV FISTULA PLACEMENT Left 11/24/2018   Procedure: ARTERIOVENOUS (AV) FISTULA CREATION LEFT SFA TO LEFT FEMORAL VEIN;  Surgeon: Waynetta Sandy, MD;  Location: [place];  Service: Vascular;  Laterality: Left;  . CHOLECYSTECTOMY    . FEMORAL ARTERY EXPLORATION  03/30/2019   Procedure: Left Common Femoral Artery and Vein Exploration;  Surgeon: Waynetta Sandy, MD;  Location: [place];  Service: Vascular;;  . FEMORAL-FEMORAL BYPASS GRAFT Left 11/24/2018   Procedure: BYPASS GRAFT FEMORAL-FEMORAL VENOUS LEFT TO RIGHT PALMA PROCEDURE USING CRYOVEIN;  Surgeon: Waynetta Sandy, MD;  Location: [place];  Service: Vascular;  Laterality: Left;  . GROIN DEBRIDEMENT Left 04/27/2019   Procedure: GROIN DEBRIDEMENT;  Surgeon: Waynetta Sandy, MD;  Location: [place];  Service: Vascular;  Laterality: Left;  . INSERTION OF ILIAC STENT  03/30/2019   Procedure: Stent of left common, external iliac veins and left common femoral vein;  Surgeon: Waynetta Sandy, MD;  Location: [place];  Service: Vascular;;  . LOWER EXTREMITY VENOGRAPHY N/A 08/17/2018   Procedure: LOWER EXTREMITY VENOGRAPHY - Central Venogram;  Surgeon: Waynetta Sandy, MD;  Location: [place] [facility];  Service: Cardiovascular;  Laterality: N/A;  . LOWER EXTREMITY VENOGRAPHY Bilateral 03/09/2019   Procedure: LOWER EXTREMITY VENOGRAPHY;  Surgeon: Waynetta Sandy, MD;  Location: [place] [facility];  Service: Cardiovascular;  Laterality: Bilateral;  . LUMBAR FUSION  11/21/2000   L5-S1  . LUMBAR SPINE SURGERY     x 2 others  . PATCH ANGIOPLASTY Left 03/30/2019   Procedure: Patch Angioplasty of the Left Common Femoral Vein using Venosure Biologic patch;  Surgeon: Waynetta Sandy, MD;  Location: [place];  Service: Vascular;  Laterality: Left;  . TRIGGER FINGER RELEASE Right 12/01/2017   Procedure: RELEASE TRIGGER FINGER/A-1 PULLEY RIGHT THUMB;  Surgeon: Leanora Cover, MD;  Location: [place];  Service: Orthopedics;  Laterality: Right;  . TRIGGER FINGER RELEASE Left  01/26/2018   Procedure: LEFT TRIGGER THUMB RELEASE;  Surgeon: Leanora Cover, MD;  Location: [place];  Service: Orthopedics;  Laterality: Left;  . ULTRASOUND GUIDANCE FOR VASCULAR ACCESS Right 03/30/2019   Procedure: Ultrasound-guided cannulation right internal jugular vein;  Surgeon: Waynetta Sandy, MD;  Location: [facility];  Service: Vascular;  Laterality: Right;    Family Psychiatric History: ***  Family History:  Family History  Problem Relation Age of Onset  . Heart  disease Mother   . Hyperlipidemia Mother   . Hypertension Mother   . Bipolar disorder Mother   . Diabetes Father   . Heart disease Father   . Hyperlipidemia Father   . Hypertension Father   . Drug abuse Daughter   . ADD / ADHD Daughter   . Drug abuse Daughter   . Anxiety disorder Daughter   . Bipolar disorder Daughter   . Hypertension Sister   . Hypertension Brother   . Hyperlipidemia Brother   . Heart disease Brother   . Bipolar disorder Maternal Aunt   . Suicidality Maternal Aunt     Social History:  Social History   Socioeconomic History  . Marital status: Divorced    Spouse name: Not on file  . Number of children: Not on file  . Years of education: Not on file  . Highest education level: Not on file  Occupational History  . Not on file  Social Needs  . Financial resource strain: Not on file  . Food insecurity    Worry: Not on file    Inability: Not on file  . Transportation needs    Medical: Not on file    Non-medical: Not on file  Tobacco Use  . Smoking status: Never Smoker  . Smokeless tobacco: Never Used  Substance and Sexual Activity  . Alcohol use: No  . Drug use: No  . Sexual activity: Yes    Partners: Male    Birth control/protection: Surgical  Lifestyle  . Physical activity    Days per week: Not on file    Minutes per session: Not on file  . Stress: Not on file  Relationships  . Social Herbalist on phone: Not on file    Gets together: Not on file    Attends religious service: Not on file    Active member of club or organization: Not on file    Attends meetings of clubs or organizations: Not on file    Relationship status: Not on file  Other Topics Concern  . Not on file  Social History Narrative  . Not on file    Allergies:  Allergies  Allergen Reactions  . Aripiprazole Other (See Comments)    BECOMES  VIOLENT   . Seroquel [Quetiapine Fumarate] Other (See Comments)    BECOMES VIOLENT  . Chlorpromazine Other (See  Comments)    SEVERE ANXIETY   . Gabapentin Other (See Comments)    NIGHTMARES     Metabolic Disorder Labs: Lab Results  Component Value Date   HGBA1C 6.3 (H) 03/26/2019   MPG 134.11 03/26/2019   No results found for: PROLACTIN No results found for: CHOL, TRIG, HDL, CHOLHDL, VLDL, LDLCALC No results found for: TSH  Therapeutic Level Labs: No results found for: LITHIUM Lab Results  Component Value Date   VALPROATE 61.2 03/23/2014   No components found for:  CBMZ  Current Medications: Current Outpatient Medications  Medication Sig Dispense Refill  . ALPRAZolam (XANAX) 1 MG tablet Take  1 tablet (1 mg total) by mouth 2 (two) times daily as needed for anxiety. 60 tablet 2  . amphetamine-dextroamphetamine (ADDERALL) 30 MG tablet Take 1 tablet by mouth 2 (two) times daily. Fill after 10/27/18 60 tablet 0  . aspirin EC 81 MG tablet Take 81 mg by mouth daily.    Marland Kitchen atorvastatin (LIPITOR) 20 MG tablet Take 20 mg by mouth daily.    . cetirizine (ZYRTEC) 10 MG tablet Take 10 mg by mouth daily.    . Cholecalciferol (VITAMIN D3) 125 MCG (5000 UT) CAPS Take 5,000 Units by mouth at bedtime.    . cycloSPORINE (RESTASIS) 0.05 % ophthalmic emulsion Place 1 drop into both eyes 2 (two) times daily.    . DULoxetine (CYMBALTA) 60 MG capsule Take 1 capsule (60 mg total) by mouth 2 (two) times daily. 60 capsule 2  . furosemide (LASIX) 40 MG tablet Take 40 mg by mouth as needed for fluid.     Marland Kitchen glipiZIDE (GLUCOTROL XL) 5 MG 24 hr tablet Take 5 mg by mouth daily with breakfast.    . hydrochlorothiazide (HYDRODIURIL) 25 MG tablet Take 25 mg by mouth daily.    Marland Kitchen HYDROcodone-acetaminophen (NORCO) 10-325 MG tablet Take 1 tablet by mouth every 6 (six) hours as needed (for pain.).    Marland Kitchen levothyroxine (SYNTHROID, LEVOTHROID) 137 MCG tablet Take 137 mcg by mouth daily before breakfast.    . linaclotide (LINZESS) 145 MCG CAPS capsule Take 145 mcg by mouth daily.     . Magnesium 250 MG TABS Take 250 mg by mouth  daily.    . Melatonin 10 MG TBDP Take 10 mg by mouth at bedtime as needed (for sleep).    . Multiple Vitamin (MULTIVITAMIN WITH MINERALS) TABS tablet Take 1 tablet by mouth daily. One-A-Day Women's Multivitamin    . Omega-3 Fatty Acids (FISH OIL) 1000 MG CAPS Take 1,000 mg by mouth daily.     . pantoprazole (PROTONIX) 40 MG tablet Take 40 mg by mouth daily.    Marland Kitchen PAZEO 0.7 % SOLN Place 1 drop into both eyes daily.     . Polyvinyl Alcohol-Povidone (REFRESH OP) Place 1 drop into both eyes 3 (three) times daily.     . potassium chloride (K-DUR) 10 MEQ tablet Take 10 mEq by mouth 3 (three) times daily.    . pregabalin (LYRICA) 100 MG capsule Take 100 mg by mouth 3 (three) times daily.    . rivaroxaban (XARELTO) 20 MG TABS tablet Take 1 tablet (20 mg total) by mouth daily with supper. 30 tablet 6  . rOPINIRole (REQUIP) 2 MG tablet Take 2 mg by mouth 2 (two) times daily.    Marland Kitchen triamcinolone (KENALOG) 0.1 % paste Use as directed 1 application in the mouth or throat 4 (four) times daily as needed (mouth ulcers).    . vitamin C (ASCORBIC ACID) 500 MG tablet Take 500 mg by mouth at bedtime.     No current facility-administered medications for this visit.      Musculoskeletal: Strength & Muscle Tone: {desc; muscle tone:32375} Gait & Station: {PE GAIT ED PYPP:50932} Patient leans: {Patient Leans:21022755}  Psychiatric Specialty Exam: ROS  There were no vitals taken for this visit.There is no height or weight on file to calculate BMI.  General Appearance: {Appearance:22683}  Eye Contact:  {BHH EYE CONTACT:22684}  Speech:  {Speech:22685}  Volume:  {Volume (PAA):22686}  Mood:  {BHH MOOD:22306}  Affect:  {Affect (PAA):22687}  Thought Process:  {Thought Process (PAA):22688}  Orientation:  {BHH  ORIENTATION (PAA):22689}  Thought Content: {Thought Content:22690}   Suicidal Thoughts:  {ST/HT (PAA):22692}  Homicidal Thoughts:  {ST/HT (PAA):22692}  Memory:  {BHH MEMORY:22881}  Judgement:  {Judgement  (PAA):22694}  Insight:  {Insight (PAA):22695}  Psychomotor Activity:  {Psychomotor (PAA):22696}  Concentration:  {Concentration:21399}  Recall:  {BHH GOOD/FAIR/POOR:22877}  Fund of Knowledge: {BHH GOOD/FAIR/POOR:22877}  Language: {BHH GOOD/FAIR/POOR:22877}  Akathisia:  {BHH YES OR NO:22294}  Handed:  {Handed:22697}  AIMS (if indicated): {Desc; done/not:10129}  Assets:  {Assets (PAA):22698}  ADL's:  {BHH HLK'T:62563}  Cognition: {chl bhh cognition:304700322}  Sleep:  {BHH GOOD/FAIR/POOR:22877}   Screenings: PHQ2-9     Nutrition from 09/22/2014 in [facility]  PHQ-2 Total Score  4  PHQ-9 Total Score  15       Assessment and Plan: ***   [name], MD 05/18/2019, 1:55 PM

## 2019-05-18 NOTE — Progress Notes (Signed)
Virtual Visit via Video Note  I connected with Jane [name] on 05/18/19 at  1:40 PM EDT by a video enabled telemedicine application and verified that I am speaking with the correct person using two identifiers.   I discussed the limitations of evaluation and management by telemedicine and the availability of in person appointments. The patient expressed understanding and agreed to proceed.    I discussed the assessment and treatment plan with the patient. The patient was provided an opportunity to ask questions and all were answered. The patient agreed with the plan and demonstrated an understanding of the instructions.   The patient was advised to call back or seek an in-person evaluation if the symptoms worsen or if the condition fails to improve as anticipated.  I provided 15 minutes of non-face-to-face time during this encounter.   Jane Spiller, MD  [facility] MD/PA/NP OP Progress Note  05/18/2019 1:56 PM Jane [name]  MRN:  [id number]  Chief Complaint:  Chief Complaint    Depression; Anxiety; Follow-up     HPI: This patient is a 53year-old divorced white female who lives alone in [place]. She is on disability due to bipolar disorder.  The patient states that she became very depressed when her mother died in 02/04/00 of a brain aneurysm. She started getting. Sad but eventually became manic and develop mood swings and agitated behavior. She's been on numerous medications over the years. In Feb 04, 1988 she also had postpartum depression. For many years she was on lithium but it caused a bad taste in her mouth and her doctor was worried it might be starting to affect her renal function so was discontinued. She also thinks that it stopped working after a number of years  The patient returns for follow-up after 3 months.  Since I last saw her she has had stent put in which then got infected and she had to have debridement of the left groin area.  She was on a wound VAC pump for a few days last week.   She now has a home health aide every day and a home health nurse coming in 2-3 times a week.  She is getting up gradually and trying to get her energy back.  Her daughter helps her when she can.  She is getting groceries brought in for her.  Overall her mood has stayed rather stable.  She denies any thoughts of self-harm or suicidal ideation.  She is frustrated after having so many surgeries and having to stay in the house but other than that she seems to be doing okay. Visit Diagnosis:    ICD-10-CM   1. Bipolar I disorder, most recent episode depressed ([place])  F31.30     Past Psychiatric History: Long-term outpatient treatment  Past Medical History:  Past Medical History:  Diagnosis Date  . Anxiety   . Bipolar disorder ([place])   . Chronic kidney disease    Stage 3 kidney disease;dx by Dr. Sinda Du.   . Constipation   . Depression   . Diabetes mellitus without complication ([place])   . Dyspnea    with exertion  . GERD (gastroesophageal reflux disease)   . Headache    migraines  . High cholesterol   . History of DVT (deep vein thrombosis)    left leg  . Hypertension    states under control with meds., has been on med. x 2 years  . Hypothyroidism   . Neuropathy   . Obsessive-compulsive disorder   . Peripheral vascular disease ([place])   .  Restless leg syndrome   . Trigger thumb of left hand 01/2018  . Trigger thumb of right hand     Past Surgical History:  Procedure Laterality Date  . ABDOMINAL HYSTERECTOMY  06/2016   complete  . APPLICATION OF WOUND VAC Left 04/27/2019   Procedure: APPLICATION OF WOUND VAC LEFT GROIN;  Surgeon: Waynetta Sandy, MD;  Location: [place];  Service: Vascular;  Laterality: Left;  . AV FISTULA PLACEMENT Left 11/24/2018   Procedure: ARTERIOVENOUS (AV) FISTULA CREATION LEFT SFA TO LEFT FEMORAL VEIN;  Surgeon: Waynetta Sandy, MD;  Location: [place];  Service: Vascular;  Laterality: Left;  . CHOLECYSTECTOMY    . FEMORAL ARTERY EXPLORATION   03/30/2019   Procedure: Left Common Femoral Artery and Vein Exploration;  Surgeon: Waynetta Sandy, MD;  Location: [place];  Service: Vascular;;  . FEMORAL-FEMORAL BYPASS GRAFT Left 11/24/2018   Procedure: BYPASS GRAFT FEMORAL-FEMORAL VENOUS LEFT TO RIGHT PALMA PROCEDURE USING CRYOVEIN;  Surgeon: Waynetta Sandy, MD;  Location: [place];  Service: Vascular;  Laterality: Left;  . GROIN DEBRIDEMENT Left 04/27/2019   Procedure: GROIN DEBRIDEMENT;  Surgeon: Waynetta Sandy, MD;  Location: [place];  Service: Vascular;  Laterality: Left;  . INSERTION OF ILIAC STENT  03/30/2019   Procedure: Stent of left common, external iliac veins and left common femoral vein;  Surgeon: Waynetta Sandy, MD;  Location: [place];  Service: Vascular;;  . LOWER EXTREMITY VENOGRAPHY N/A 08/17/2018   Procedure: LOWER EXTREMITY VENOGRAPHY - Central Venogram;  Surgeon: Waynetta Sandy, MD;  Location: [facility];  Service: Cardiovascular;  Laterality: N/A;  . LOWER EXTREMITY VENOGRAPHY Bilateral 03/09/2019   Procedure: LOWER EXTREMITY VENOGRAPHY;  Surgeon: Waynetta Sandy, MD;  Location: [place] [facility];  Service: Cardiovascular;  Laterality: Bilateral;  . LUMBAR FUSION  11/21/2000   L5-S1  . LUMBAR SPINE SURGERY     x 2 others  . PATCH ANGIOPLASTY Left 03/30/2019   Procedure: Patch Angioplasty of the Left Common Femoral Vein using Venosure Biologic patch;  Surgeon: Waynetta Sandy, MD;  Location: [facility];  Service: Vascular;  Laterality: Left;  . TRIGGER FINGER RELEASE Right 12/01/2017   Procedure: RELEASE TRIGGER FINGER/A-1 PULLEY RIGHT THUMB;  Surgeon: Leanora Cover, MD;  Location: [place];  Service: Orthopedics;  Laterality: Right;  . TRIGGER FINGER RELEASE Left 01/26/2018   Procedure: LEFT TRIGGER THUMB RELEASE;  Surgeon: Leanora Cover, MD;  Location: [place];  Service: Orthopedics;  Laterality: Left;  . ULTRASOUND GUIDANCE  FOR VASCULAR ACCESS Right 03/30/2019   Procedure: Ultrasound-guided cannulation right internal jugular vein;  Surgeon: Waynetta Sandy, MD;  Location: [facility];  Service: Vascular;  Laterality: Right;    Family Psychiatric History: See below  Family History:  Family History  Problem Relation Age of Onset  . Heart disease Mother   . Hyperlipidemia Mother   . Hypertension Mother   . Bipolar disorder Mother   . Diabetes Father   . Heart disease Father   . Hyperlipidemia Father   . Hypertension Father   . Drug abuse Daughter   . ADD / ADHD Daughter   . Drug abuse Daughter   . Anxiety disorder Daughter   . Bipolar disorder Daughter   . Hypertension Sister   . Hypertension Brother   . Hyperlipidemia Brother   . Heart disease Brother   . Bipolar disorder Maternal Aunt   . Suicidality Maternal Aunt     Social History:  Social History   Socioeconomic History  . Marital status: Divorced    Spouse name: Not on file  . Number of children: Not on file  . Years of education: Not on file  . Highest education level: Not on file  Occupational History  . Not on file  Social Needs  . Financial resource strain: Not on file  . Food insecurity    Worry: Not on file    Inability: Not on file  . Transportation needs    Medical: Not on file    Non-medical: Not on file  Tobacco Use  . Smoking status: Never Smoker  . Smokeless tobacco: Never Used  Substance and Sexual Activity  . Alcohol use: No  . Drug use: No  . Sexual activity: Yes    Partners: Male    Birth control/protection: Surgical  Lifestyle  . Physical activity    Days per week: Not on file    Minutes per session: Not on file  . Stress: Not on file  Relationships  . Social Herbalist on phone: Not on file    Gets together: Not on file    Attends religious service: Not on file    Active member of club or organization: Not on file    Attends meetings of clubs or organizations: Not on file     Relationship status: Not on file  Other Topics Concern  . Not on file  Social History Narrative  . Not on file    Allergies:  Allergies  Allergen Reactions  . Aripiprazole Other (See Comments)    BECOMES  VIOLENT   . Seroquel [Quetiapine Fumarate] Other (See Comments)    BECOMES VIOLENT  . Chlorpromazine Other (See Comments)    SEVERE ANXIETY   . Gabapentin Other (See Comments)    NIGHTMARES     Metabolic Disorder Labs: Lab Results  Component Value Date   HGBA1C 6.3 (H) 03/26/2019   MPG 134.11 03/26/2019   No results found for: PROLACTIN No results found for: CHOL, TRIG, HDL, CHOLHDL, VLDL, LDLCALC No results found for: TSH  Therapeutic Level Labs: No results found for: LITHIUM Lab Results  Component Value Date   VALPROATE 61.2 03/23/2014   No components found for:  CBMZ  Current Medications: Current Outpatient Medications  Medication Sig Dispense Refill  . ALPRAZolam (XANAX) 1 MG tablet Take 1 tablet (1 mg total) by mouth 2 (two) times daily as needed for anxiety. 60 tablet 2  . amphetamine-dextroamphetamine (ADDERALL) 30 MG tablet Take 1 tablet by mouth 2 (two) times daily. Fill after 10/27/18 60 tablet 0  . aspirin EC 81 MG tablet Take 81 mg by mouth daily.    Marland Kitchen atorvastatin (LIPITOR) 20 MG tablet Take 20 mg by mouth daily.    . cetirizine (ZYRTEC) 10 MG tablet Take 10 mg by mouth daily.    . Cholecalciferol (VITAMIN D3) 125 MCG (5000 UT) CAPS Take 5,000 Units by mouth at bedtime.    . cycloSPORINE (RESTASIS) 0.05 % ophthalmic emulsion Place 1 drop into both eyes 2 (two) times daily.    . DULoxetine (CYMBALTA) 60 MG capsule Take 1 capsule (60 mg total) by mouth 2 (two) times daily. 60 capsule 2  . furosemide (LASIX) 40 MG tablet Take 40 mg by mouth as needed for fluid.     Marland Kitchen glipiZIDE (GLUCOTROL XL) 5 MG 24 hr tablet Take 5 mg by mouth daily with breakfast.    . hydrochlorothiazide (HYDRODIURIL) 25 MG tablet Take  25 mg by mouth daily.    Marland Kitchen  HYDROcodone-acetaminophen (NORCO) 10-325 MG tablet Take 1 tablet by mouth every 6 (six) hours as needed (for pain.).    Marland Kitchen levothyroxine (SYNTHROID, LEVOTHROID) 137 MCG tablet Take 137 mcg by mouth daily before breakfast.    . linaclotide (LINZESS) 145 MCG CAPS capsule Take 145 mcg by mouth daily.     . Magnesium 250 MG TABS Take 250 mg by mouth daily.    . Melatonin 10 MG TBDP Take 10 mg by mouth at bedtime as needed (for sleep).    . Multiple Vitamin (MULTIVITAMIN WITH MINERALS) TABS tablet Take 1 tablet by mouth daily. One-A-Day Women's Multivitamin    . Omega-3 Fatty Acids (FISH OIL) 1000 MG CAPS Take 1,000 mg by mouth daily.     . pantoprazole (PROTONIX) 40 MG tablet Take 40 mg by mouth daily.    Marland Kitchen PAZEO 0.7 % SOLN Place 1 drop into both eyes daily.     . Polyvinyl Alcohol-Povidone (REFRESH OP) Place 1 drop into both eyes 3 (three) times daily.     . potassium chloride (K-DUR) 10 MEQ tablet Take 10 mEq by mouth 3 (three) times daily.    . pregabalin (LYRICA) 100 MG capsule Take 100 mg by mouth 3 (three) times daily.    . rivaroxaban (XARELTO) 20 MG TABS tablet Take 1 tablet (20 mg total) by mouth daily with supper. 30 tablet 6  . rOPINIRole (REQUIP) 2 MG tablet Take 2 mg by mouth 2 (two) times daily.    Marland Kitchen triamcinolone (KENALOG) 0.1 % paste Use as directed 1 application in the mouth or throat 4 (four) times daily as needed (mouth ulcers).    . vitamin C (ASCORBIC ACID) 500 MG tablet Take 500 mg by mouth at bedtime.     No current facility-administered medications for this visit.      Musculoskeletal: Strength & Muscle Tone: within normal limits Gait & Station: normal Patient leans: N/A  Psychiatric Specialty Exam: Review of Systems  Constitutional: Positive for malaise/fatigue.  Musculoskeletal: Positive for myalgias.  All other systems reviewed and are negative.   There were no vitals taken for this visit.There is no height or weight on file to calculate BMI.  General  Appearance: Casual and Fairly Groomed  Eye Contact:  Good  Speech:  Clear and Coherent  Volume:  Normal  Mood:  Euthymic  Affect:  Appropriate and Congruent  Thought Process:  Goal Directed  Orientation:  Full (Time, Place, and Person)  Thought Content: Rumination   Suicidal Thoughts:  No  Homicidal Thoughts:  No  Memory:  Immediate;   Good Recent;   Good Remote;   Good  Judgement:  Fair  Insight:  Fair  Psychomotor Activity:  Decreased  Concentration:  Concentration: Fair and Attention Span: Fair  Recall:  Good  Fund of Knowledge: Good  Language: Good  Akathisia:  No  Handed:  Right  AIMS (if indicated): not done  Assets:  Communication Skills Desire for Improvement Resilience Social Support Talents/Skills  ADL's:  Intact  Cognition: WNL  Sleep:  Good   Screenings: PHQ2-9     Nutrition from 09/22/2014 in [facility]  PHQ-2 Total Score  4  PHQ-9 Total Score  15       Assessment and Plan: This patient is a 53 year old female with a history of depression and anxiety as well as ADD.  She is currently focused on trying to recover from her vascular surgery as  she needs to be.  For now she will continue Xanax 1 mg twice daily as needed for anxiety and Cymbalta 60 mg twice daily for depression.  She will return to see me in 3 months   Jane Spiller, MD 05/18/2019, 1:56 PM

## 2019-05-21 ENCOUNTER — Ambulatory Visit (INDEPENDENT_AMBULATORY_CARE_PROVIDER_SITE_OTHER): Payer: Self-pay | Admitting: Vascular Surgery

## 2019-05-21 ENCOUNTER — Encounter: Payer: Self-pay | Admitting: Vascular Surgery

## 2019-05-21 ENCOUNTER — Ambulatory Visit (HOSPITAL_COMMUNITY)
Admission: RE | Admit: 2019-05-21 | Discharge: 2019-05-21 | Disposition: A | Discharge status: Home / Self Care | Payer: Medicaid Other | Source: Ambulatory Visit | Attending: Vascular Surgery | Admitting: Vascular Surgery

## 2019-05-21 ENCOUNTER — Other Ambulatory Visit: Payer: Self-pay

## 2019-05-21 VITALS — BP 146/80 | HR 86 | Temp 97.9°F | Resp 20 | Ht 70.0 in | Wt 274.8 lb

## 2019-05-21 DIAGNOSIS — I871 Compression of vein: Secondary | ICD-10-CM | POA: Insufficient documentation

## 2019-05-21 DIAGNOSIS — I87002 Postthrombotic syndrome without complications of left lower extremity: Secondary | ICD-10-CM

## 2019-05-21 NOTE — Progress Notes (Signed)
    Subjective:     Patient ID: Jane [name], female   DOB: Jun 14, 1966, 53 y.o.   MRN: [id number]  HPI 53 year old female follows up for wound check.  Wound is been healing well.  Swelling much better.  States that she is feeling a lot of fatigue during the day.  Recently canceled a beach vacation.  Overall has been doing well states that her swelling in her left lower extremity is getting much better than previous.   Review of Systems Fatigue Left groin wound healing Extremity edema improving    Objective:   Physical Exam Vitals:   05/21/19 0944  BP: (!) 146/80  Pulse: 86  Resp: 20  Temp: 97.9 F (36.6 C)  SpO2: 99%   Awake alert oriented Left groin wound 2 areas that are open silver nitrate applied today Right leg measures 38 cm 10 cm from the tibial tuberosity and left leg measured 43.5 cm.  Presurgical this was 50 cm on the left.  I have independently interpreted her IVC iliac study which demonstrates patent left common iliac and external iliac veins on the left with flow that appears pulsatile.    Assessment/plan     53 year old female who underwent stenting of her left common and external leg veins as well as left common femoral vein with vein patch angioplasty with the help of Dr. Trula Slade in May.  She has had a persistent wound which has nearly healed.  Edema in the left lower extremity markedly improved from presurgical at this point patient without further complaints.  Wound is nearly healed can go to dry dressings.  Should be okay to submerge in water at this point.  We will follow-up 3 to 4 months with repeat IVC iliac duplex.  She will continue aspirin and Xarelto and this is likely lifelong.    Brandon C. Donzetta Matters, MD [facility] Office: [phone] Pager: [phone]

## 2019-05-28 ENCOUNTER — Other Ambulatory Visit (HOSPITAL_COMMUNITY)
Admission: RE | Admit: 2019-05-28 | Discharge: 2019-05-28 | Disposition: A | Discharge status: Home / Self Care | Payer: Medicaid Other | Source: Ambulatory Visit | Attending: Neurology | Admitting: Neurology

## 2019-05-28 ENCOUNTER — Other Ambulatory Visit: Payer: Self-pay

## 2019-06-30 LAB — BASIC METABOLIC PANEL
BUN: 28 — AB (ref 4–21)
CO2: 31 — AB (ref 13–22)
Chloride: 100 (ref 99–108)
Creatinine: 1.3 — AB (ref 0.5–1.1)
Glucose: 98
Potassium: 3.5 (ref 3.4–5.3)
Sodium: 141 (ref 137–147)

## 2019-06-30 LAB — COMPREHENSIVE METABOLIC PANEL
Albumin: 1.3 — AB (ref 3.5–5.0)
Calcium: 10 (ref 8.7–10.7)
GFR calc Af Amer: 54
GFR calc non Af Amer: 46
Globulin: 3.2

## 2019-06-30 LAB — HEPATIC FUNCTION PANEL
ALT: 13 (ref 7–35)
AST: 14 (ref 13–35)
Alkaline Phosphatase: 114 (ref 25–125)

## 2019-06-30 LAB — TSH: TSH: 7.92 — AB (ref 0.41–5.90)

## 2019-06-30 LAB — CBC AND DIFFERENTIAL
HCT: 31 — AB (ref 36–46)
Hemoglobin: 9.3 — AB (ref 12.0–16.0)
Platelets: 199 (ref 150–399)
WBC: 6.6

## 2019-06-30 LAB — POCT INR: INR: 2.2 — AB (ref 0.9–1.1)

## 2019-06-30 LAB — IRON,TIBC AND FERRITIN PANEL
Ferritin: 20
Iron: 22
TIBC: 405

## 2019-06-30 LAB — CBC: RBC: 4.05 (ref 3.87–5.11)

## 2019-06-30 LAB — PROTIME-INR: Protime: 22.1 — AB (ref 10.0–13.8)

## 2019-07-05 ENCOUNTER — Other Ambulatory Visit (HOSPITAL_COMMUNITY): Payer: Self-pay | Admitting: Family

## 2019-07-05 ENCOUNTER — Ambulatory Visit (HOSPITAL_COMMUNITY)
Admission: RE | Admit: 2019-07-05 | Discharge: 2019-07-05 | Disposition: A | Discharge status: Home / Self Care | Payer: Medicaid Other | Source: Ambulatory Visit | Attending: Family | Admitting: Family

## 2019-07-05 ENCOUNTER — Other Ambulatory Visit: Payer: Self-pay

## 2019-07-05 ENCOUNTER — Encounter: Payer: Self-pay | Admitting: Family

## 2019-07-05 ENCOUNTER — Ambulatory Visit (INDEPENDENT_AMBULATORY_CARE_PROVIDER_SITE_OTHER): Payer: Self-pay | Admitting: Family

## 2019-07-05 VITALS — BP 135/76 | HR 78 | Temp 96.8°F | Resp 16 | Ht 70.0 in | Wt 290.0 lb

## 2019-07-05 DIAGNOSIS — M7989 Other specified soft tissue disorders: Secondary | ICD-10-CM

## 2019-07-05 DIAGNOSIS — I871 Compression of vein: Secondary | ICD-10-CM

## 2019-07-05 DIAGNOSIS — I87002 Postthrombotic syndrome without complications of left lower extremity: Secondary | ICD-10-CM

## 2019-07-05 NOTE — Progress Notes (Signed)
CC: left leg swelling, heaviness, and pain x 2 weeks  History of Present Illness  Jane [name] is a 53 y.o. 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  She is s/p stenting of her left common external iliac veins in May 2020 by Dr. Donzetta Matters, then s/p Irrigation debridement left groin postsurgical wound to 8 x 3 x 4 cm deep on 04-27-19 by Dr. Donzetta Matters for non healing surgical wound. She is also s/p left common femoral vein to right common femoral vein bypass with cryopreserved greater saphenous vein 5.5 mm in diameter, and creation of left superficial femoral artery to left femoral vein AV fistula with interposition cryopreserved saphenous vein on 11-24-18 by Dr. 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   She was last evaluated by Dr. Donzetta Matters on 05-21-19. At that time she has had a persistent wound which had nearly healed.  Edema in the left lower extremity had markedly improved from presurgical at that point patient without further complaints.  Wound was nearly healed, dry dressings advised.  Should be okay to submerge in water at that point. Pt was to follow-up 3 to 4 months with repeat IVC iliac duplex.  She will continue aspirin and Xarelto and this is likely lifelong.  She returns today with c/o left leg swelling x 2 weeks with sharp pain at he the distal medial aspect of her thigh, left leg feels heavy. Although she states last night the swelling in her left leg decreased somewhat.  She states that until 2 weeks ago, her left leg was about the same size as her right leg.  She denies dyspnea, denies chest pain.  She denies fever or chills.    Diabetic: Yes, 6.3 [name] on 03-26-19, in control  Tobacco use: non-smoker  Pt meds include: Statin :Yes Betablocker: No ASA: Yes Other anticoagulants/antiplatelets: Xarelto, hx of DVT left leg   Past Medical History:  Diagnosis Date   Anxiety    Bipolar disorder ([place])    Chronic kidney disease      Stage 3 kidney disease;dx by Dr. Sinda Du.    Constipation    Depression    Diabetes mellitus without complication (HCC)    Dyspnea    with exertion   GERD (gastroesophageal reflux disease)    Headache    migraines   High cholesterol    History of DVT (deep vein thrombosis)    left leg   Hypertension    states under control with meds., has been on med. x 2 years   Hypothyroidism    Neuropathy    Obsessive-compulsive disorder    Peripheral vascular disease (HCC)    Restless leg syndrome    Trigger thumb of left hand 01/2018   Trigger thumb of right hand     Social History Social History   Tobacco Use   Smoking status: Never Smoker   Smokeless tobacco: Never Used  Substance Use Topics   Alcohol use: No   Drug use: No    Family History Family History  Problem Relation Age of Onset   Heart disease Mother    Hyperlipidemia Mother    Hypertension Mother    Bipolar disorder Mother    Diabetes Father    Heart disease Father    Hyperlipidemia Father    Hypertension Father    Drug abuse Daughter    ADD / ADHD Daughter    Drug abuse Daughter    Anxiety disorder Daughter  Bipolar disorder Daughter    Hypertension Sister    Hypertension Brother    Hyperlipidemia Brother    Heart disease Brother    Bipolar disorder Maternal Aunt    Suicidality Maternal Aunt     Surgical History Past Surgical History:  Procedure Laterality Date   ABDOMINAL HYSTERECTOMY  06/2016   complete   APPLICATION OF WOUND VAC Left 04/27/2019   Procedure: APPLICATION OF WOUND VAC LEFT GROIN;  Surgeon: Waynetta Sandy, MD;  Location: [facility];  Service: Vascular;  Laterality: Left;   AV FISTULA PLACEMENT Left 11/24/2018   Procedure: ARTERIOVENOUS (AV) FISTULA CREATION LEFT SFA TO LEFT FEMORAL VEIN;  Surgeon: Waynetta Sandy, MD;  Location: [facility];  Service: Vascular;  Laterality: Left;   CHOLECYSTECTOMY     FEMORAL ARTERY  EXPLORATION  03/30/2019   Procedure: Left Common Femoral Artery and Vein Exploration;  Surgeon: Waynetta Sandy, MD;  Location: [facility];  Service: Vascular;;   FEMORAL-FEMORAL BYPASS GRAFT Left 11/24/2018   Procedure: BYPASS GRAFT FEMORAL-FEMORAL VENOUS LEFT TO RIGHT PALMA PROCEDURE USING CRYOVEIN;  Surgeon: Waynetta Sandy, MD;  Location: [facility];  Service: Vascular;  Laterality: Left;   GROIN DEBRIDEMENT Left 04/27/2019   Procedure: [name] DEBRIDEMENT;  Surgeon: Waynetta Sandy, MD;  Location: [facility];  Service: Vascular;  Laterality: Left;   INSERTION OF ILIAC STENT  03/30/2019   Procedure: Stent of left common, external iliac veins and left common femoral vein;  Surgeon: Waynetta Sandy, MD;  Location: [facility];  Service: Vascular;;   LOWER EXTREMITY VENOGRAPHY N/A 08/17/2018   Procedure: LOWER EXTREMITY VENOGRAPHY - Central Venogram;  Surgeon: Waynetta Sandy, MD;  Location: [facility];  Service: Cardiovascular;  Laterality: N/A;   LOWER EXTREMITY VENOGRAPHY Bilateral 03/09/2019   Procedure: LOWER EXTREMITY VENOGRAPHY;  Surgeon: Waynetta Sandy, MD;  Location: [facility];  Service: Cardiovascular;  Laterality: Bilateral;   LUMBAR FUSION  11/21/2000   L5-S1   LUMBAR SPINE SURGERY     x 2 others   PATCH ANGIOPLASTY Left 03/30/2019   Procedure: Patch Angioplasty of the Left Common Femoral Vein using Venosure Biologic patch;  Surgeon: Waynetta Sandy, MD;  Location: [facility];  Service: Vascular;  Laterality: Left;   TRIGGER FINGER RELEASE Right 12/01/2017   Procedure: RELEASE TRIGGER FINGER/A-1 PULLEY RIGHT THUMB;  Surgeon: Leanora Cover, MD;  Location: [facility];  Service: Orthopedics;  Laterality: Right;   TRIGGER FINGER RELEASE Left 01/26/2018   Procedure: LEFT TRIGGER THUMB RELEASE;  Surgeon: Leanora Cover, MD;  Location: [facility];  Service: Orthopedics;  Laterality: Left;   ULTRASOUND  GUIDANCE FOR VASCULAR ACCESS Right 03/30/2019   Procedure: Ultrasound-guided cannulation right internal jugular vein;  Surgeon: Waynetta Sandy, MD;  Location: [facility];  Service: Vascular;  Laterality: Right;    Allergies  Allergen Reactions   Aripiprazole Other (See Comments)    BECOMES  VIOLENT    Seroquel [Quetiapine Fumarate] Other (See Comments)    BECOMES VIOLENT   Chlorpromazine Other (See Comments)    SEVERE ANXIETY    Gabapentin Other (See Comments)    NIGHTMARES     Current Outpatient Medications  Medication Sig Dispense Refill   ALPRAZolam (XANAX) 1 MG tablet Take 1 tablet (1 mg total) by mouth 2 (two) times daily as needed for anxiety. 60 tablet 2   amphetamine-dextroamphetamine (ADDERALL) 30 MG tablet Take 1 tablet by mouth 2 (two) times daily. Fill after 10/27/18 60 tablet  0   aspirin EC 81 MG tablet Take 81 mg by mouth daily.     atorvastatin (LIPITOR) 20 MG tablet Take 20 mg by mouth daily.     cetirizine (ZYRTEC) 10 MG tablet Take 10 mg by mouth daily.     Cholecalciferol (VITAMIN D3) 125 MCG (5000 UT) CAPS Take 5,000 Units by mouth at bedtime.     cycloSPORINE (RESTASIS) 0.05 % ophthalmic emulsion Place 1 drop into both eyes 2 (two) times daily.     DULoxetine (CYMBALTA) 60 MG capsule Take 1 capsule (60 mg total) by mouth 2 (two) times daily. 60 capsule 2   glipiZIDE (GLUCOTROL XL) 5 MG 24 hr tablet Take 5 mg by mouth daily with breakfast.     hydrochlorothiazide (HYDRODIURIL) 25 MG tablet Take 25 mg by mouth daily.     HYDROcodone-acetaminophen (NORCO) 10-325 MG tablet Take 1 tablet by mouth every 6 (six) hours as needed (for pain.).     levothyroxine (SYNTHROID, LEVOTHROID) 137 MCG tablet Take 137 mcg by mouth daily before breakfast.     linaclotide (LINZESS) 145 MCG CAPS capsule Take 145 mcg by mouth daily.      Magnesium 250 MG TABS Take 250 mg by mouth daily.     Melatonin 10 MG TBDP Take 10 mg by mouth at bedtime as needed (for  sleep).     Multiple Vitamin (MULTIVITAMIN WITH MINERALS) TABS tablet Take 1 tablet by mouth daily. One-A-Day Women's Multivitamin     Omega-3 Fatty Acids (FISH OIL) 1000 MG CAPS Take 1,000 mg by mouth daily.      pantoprazole (PROTONIX) 40 MG tablet Take 40 mg by mouth daily.     PAZEO 0.7 % SOLN Place 1 drop into both eyes daily.      Polyvinyl Alcohol-Povidone (REFRESH OP) Place 1 drop into both eyes 3 (three) times daily.      potassium chloride (K-DUR) 10 MEQ tablet Take 10 mEq by mouth 3 (three) times daily.     pregabalin (LYRICA) 100 MG capsule Take 100 mg by mouth 3 (three) times daily.     rivaroxaban (XARELTO) 20 MG TABS tablet Take 1 tablet (20 mg total) by mouth daily with supper. 30 tablet 6   rOPINIRole (REQUIP) 2 MG tablet Take 2 mg by mouth 2 (two) times daily.     triamcinolone (KENALOG) 0.1 % paste Use as directed 1 application in the mouth or throat 4 (four) times daily as needed (mouth ulcers).     vitamin C (ASCORBIC ACID) 500 MG tablet Take 500 mg by mouth at bedtime.     furosemide (LASIX) 40 MG tablet Take 40 mg by mouth as needed for fluid.      No current facility-administered medications for this visit.     REVIEW OF SYSTEMS: see HPI for pertinent positives and negatives   Physical Examination  Vitals:   07/05/19 1320  BP: 135/76  Pulse: 78  Resp: 16  Temp: (!) 96.8 F (36 C)  TempSrc: Temporal  SpO2: 94%  Weight: 290 lb (131.5 kg)  Height: 5\' 10"  (1.778 m)   Body mass index is 41.61 kg/m.  General: Morbidly obese female in NAD  HEENT:  No gross abnormalities Pulmonary: Respirations are non-labored, CTAB, distant breath sounds, no rales, rhonchi, or wheezes Abdomen: Soft and non-tender with normal pitched bowel sounds.  Musculoskeletal: There are no major deformities. Left leg slightly larger than right, see photo below       Neurologic: No focal weakness or  paresthesias are detected. Skin: There are no ulcer or rashes noted. Mild  Peau d' orange left lower leg, see photo above Psychiatric: The patient has normal affect. Cardiovascular: There is a regular rate and rhythm without significant murmur appreciated.    Vascular: Vessel Right Left  Radial 2+Palpable 2+Palpable  Carotid  without bruit  without bruit  Aorta Not palpable N/A  Femoral notPalpable 2+Palpable  Popliteal Not palpable Not palpable  PT 2+Palpable 2+Palpable  DP Not Palpable Not Palpable     DATA  BLE Venous Insufficiency Duplex (Date: 07/05/2019):  Risk Factors: Surgery Right to left femoral vein to femoral vein bypass now occluded (this has been known). IVC, CIV, EIV stents. Known AVF in the groin. Limitations: Body habitus and edema. Performing Technologist: Ralene Cork RVT  Summary: Left: Technically limted by scarring in the groin, body habitus, and edema. AVF noted in the groin. Difficult to compress FV due to arterialized pressure. Venous flow in the thigh is retrograde, also due to fistula. Flow is present in the EIV. The popliteal vein and PTV are compressible and flow is in the usual direction   Medical Decision Making  Chrissy Thornock is a 53 y.o. female who presents with: 2 weeks hx of left thigh pain and left leg swelling, swelling subsided last night according to pt.  Left left venous duplex today shows no DVT, limited visualization.  I disused the above with Dr. Trula Slade. She needs to wear thigh high compression hose if she can put them on, states she cannot put them on.  I discussed with her legs elevation as follows:  To decrease swelling in your feet and legs: Elevate feet above slightly bent knees, feet above heart, overnight and 3-4 times per day for 20 minutes.   Follow up with Dr. Donzetta Matters and venous non invasive testing in October as already scheduled.   Clemon Chambers, RN, MSN, FNP-C [facility] Office: [phone]  07/05/2019, 1:30 PM  Clinic MD: Trula Slade

## 2019-07-14 ENCOUNTER — Telehealth: Payer: Self-pay

## 2019-07-14 NOTE — Telephone Encounter (Signed)
Pt said that even though she was here on the 31st and all checked out well - she went to the ER in [place] yesterday and they told her that they thought she has a clot. They told her she needed to follow up here. Appt made and pt advised to bring disc and report with her to visit.   York Cerise, CMA

## 2019-07-15 ENCOUNTER — Ambulatory Visit: Payer: Medicaid Other

## 2019-07-23 ENCOUNTER — Other Ambulatory Visit: Payer: Self-pay

## 2019-07-23 ENCOUNTER — Other Ambulatory Visit (HOSPITAL_COMMUNITY)
Admission: RE | Admit: 2019-07-23 | Discharge: 2019-07-23 | Disposition: A | Discharge status: Home / Self Care | Payer: Medicaid Other | Source: Ambulatory Visit | Attending: Neurology | Admitting: Neurology

## 2019-07-23 DIAGNOSIS — Z20828 Contact with and (suspected) exposure to other viral communicable diseases: Secondary | ICD-10-CM | POA: Insufficient documentation

## 2019-07-23 DIAGNOSIS — Z01812 Encounter for preprocedural laboratory examination: Secondary | ICD-10-CM | POA: Diagnosis present

## 2019-07-23 LAB — SARS CORONAVIRUS 2 (TAT 6-24 HRS): SARS Coronavirus 2: NEGATIVE

## 2019-07-26 ENCOUNTER — Other Ambulatory Visit: Payer: Self-pay

## 2019-07-26 ENCOUNTER — Ambulatory Visit: Payer: Medicaid Other | Attending: Pulmonary Disease | Admitting: Neurology

## 2019-07-26 DIAGNOSIS — G4733 Obstructive sleep apnea (adult) (pediatric): Secondary | ICD-10-CM | POA: Insufficient documentation

## 2019-07-26 DIAGNOSIS — G2581 Restless legs syndrome: Secondary | ICD-10-CM

## 2019-07-26 DIAGNOSIS — Z7982 Long term (current) use of aspirin: Secondary | ICD-10-CM | POA: Insufficient documentation

## 2019-07-26 DIAGNOSIS — Z7901 Long term (current) use of anticoagulants: Secondary | ICD-10-CM | POA: Diagnosis not present

## 2019-07-26 DIAGNOSIS — Z79890 Hormone replacement therapy (postmenopausal): Secondary | ICD-10-CM | POA: Insufficient documentation

## 2019-07-26 DIAGNOSIS — Z7984 Long term (current) use of oral hypoglycemic drugs: Secondary | ICD-10-CM | POA: Diagnosis not present

## 2019-07-26 DIAGNOSIS — Z79899 Other long term (current) drug therapy: Secondary | ICD-10-CM | POA: Insufficient documentation

## 2019-07-28 NOTE — Procedures (Signed)
Leando A. Merlene Laughter, MD     [email or web address]             NOCTURNAL POLYSOMNOGRAPHY   LOCATION: [facility]  Patient Name: Jane [name], Jane [name] Date: 07/26/2019 Gender: Female D.O.B: Jul 29, 1966 Age (years): 52 Referring Provider: Sinda Du Height (inches): 70 Interpreting Physician: Phillips Odor MD, ABSM Weight (lbs): 290 RPSGT: Rosebud Poles BMI: 42 MRN: [id number] Neck Size: 17.00 CLINICAL INFORMATION Sleep Study Type: Split Night CPAP  Indication for sleep study: N/A  Epworth Sleepiness Score: 19  SLEEP STUDY TECHNIQUE As per the AASM Manual for the Scoring of Sleep and Associated Events v2.3 (April 2016) with a hypopnea requiring 4% desaturations.  The channels recorded and monitored were frontal, central and occipital EEG, electrooculogram (EOG), submentalis EMG (chin), nasal and oral airflow, thoracic and abdominal wall motion, anterior tibialis EMG, snore microphone, electrocardiogram, and pulse oximetry. Continuous positive airway pressure (CPAP) was initiated when the patient met split night criteria and was titrated according to treat sleep-disordered breathing.  MEDICATIONS Medications self-administered by patient taken the night of the study : N/A  Current Outpatient Medications:  .  ALPRAZolam (XANAX) 1 MG tablet, Take 1 tablet (1 mg total) by mouth 2 (two) times daily as needed for anxiety., Disp: 60 tablet, Rfl: 2 .  amphetamine-dextroamphetamine (ADDERALL) 30 MG tablet, Take 1 tablet by mouth 2 (two) times daily. Fill after 10/27/18, Disp: 60 tablet, Rfl: 0 .  aspirin EC 81 MG tablet, Take 81 mg by mouth daily., Disp: , Rfl:  .  atorvastatin (LIPITOR) 20 MG tablet, Take 20 mg by mouth daily., Disp: , Rfl:  .  cetirizine (ZYRTEC) 10 MG tablet, Take 10 mg by mouth daily., Disp: , Rfl:  .  Cholecalciferol (VITAMIN D3) 125 MCG (5000 UT) CAPS, Take 5,000 Units by mouth at bedtime., Disp: , Rfl:  .  cycloSPORINE (RESTASIS)  0.05 % ophthalmic emulsion, Place 1 drop into both eyes 2 (two) times daily., Disp: , Rfl:  .  DULoxetine (CYMBALTA) 60 MG capsule, Take 1 capsule (60 mg total) by mouth 2 (two) times daily., Disp: 60 capsule, Rfl: 2 .  furosemide (LASIX) 40 MG tablet, Take 40 mg by mouth as needed for fluid. , Disp: , Rfl:  .  glipiZIDE (GLUCOTROL XL) 5 MG 24 hr tablet, Take 5 mg by mouth daily with breakfast., Disp: , Rfl:  .  hydrochlorothiazide (HYDRODIURIL) 25 MG tablet, Take 25 mg by mouth daily., Disp: , Rfl:  .  HYDROcodone-acetaminophen (NORCO) 10-325 MG tablet, Take 1 tablet by mouth every 6 (six) hours as needed (for pain.)., Disp: , Rfl:  .  levothyroxine (SYNTHROID, LEVOTHROID) 137 MCG tablet, Take 137 mcg by mouth daily before breakfast., Disp: , Rfl:  .  linaclotide (LINZESS) 145 MCG CAPS capsule, Take 145 mcg by mouth daily. , Disp: , Rfl:  .  Magnesium 250 MG TABS, Take 250 mg by mouth daily., Disp: , Rfl:  .  Melatonin 10 MG TBDP, Take 10 mg by mouth at bedtime as needed (for sleep)., Disp: , Rfl:  .  Multiple Vitamin (MULTIVITAMIN WITH MINERALS) TABS tablet, Take 1 tablet by mouth daily. One-A-Day Women's Multivitamin, Disp: , Rfl:  .  Omega-3 Fatty Acids (FISH OIL) 1000 MG CAPS, Take 1,000 mg by mouth daily. , Disp: , Rfl:  .  pantoprazole (PROTONIX) 40 MG tablet, Take 40 mg by mouth daily., Disp: , Rfl:  .  PAZEO 0.7 % SOLN, Place 1 drop into both eyes daily. , Disp: , Rfl:  .  Polyvinyl Alcohol-Povidone (REFRESH OP), Place 1 drop into both eyes 3 (three) times daily. , Disp: , Rfl:  .  potassium chloride (K-DUR) 10 MEQ tablet, Take 10 mEq by mouth 3 (three) times daily., Disp: , Rfl:  .  pregabalin (LYRICA) 100 MG capsule, Take 100 mg by mouth 3 (three) times daily., Disp: , Rfl:  .  rivaroxaban (XARELTO) 20 MG TABS tablet, Take 1 tablet (20 mg total) by mouth daily with supper., Disp: 30 tablet, Rfl: 6 .  rOPINIRole (REQUIP) 2 MG tablet, Take 2 mg by mouth 2 (two) times daily., Disp: , Rfl:   .  triamcinolone (KENALOG) 0.1 % paste, Use as directed 1 application in the mouth or throat 4 (four) times daily as needed (mouth ulcers)., Disp: , Rfl:  .  vitamin C (ASCORBIC ACID) 500 MG tablet, Take 500 mg by mouth at bedtime., Disp: , Rfl:   RESPIRATORY PARAMETERS Diagnostic  Total AHI (/hr): 50.2 RDI (/hr): 50.2 OA Index (/hr): - CA Index (/hr): 0.0 REM AHI (/hr): N/A NREM AHI (/hr): 50.2 Supine AHI (/hr): N/A Non-supine AHI (/hr): 50.2 Min O2 Sat (%): 83.0 Mean O2 (%): 89.4 Time below 88% (min): 52.1   Titration  Optimal Pressure (cm): 14 AHI at Optimal Pressure (/hr): 9.3 Min O2 at Optimal Pressure (%): 83.0 Supine % at Optimal (%): 0 Sleep % at Optimal (%): 98   SLEEP ARCHITECTURE The recording time for the entire night was 393.7 minutes.  During a baseline period of 175.5 minutes, the patient slept for 155.5 minutes in REM and nonREM, yielding a sleep efficiency of 88.6%%. Sleep onset after lights out was 0.3 minutes with a REM latency of N/A minutes. The patient spent 1.6%% of the night in stage N1 sleep, 92.0%% in stage N2 sleep, 6.4%% in stage N3 and 0% in REM.  During the titration period of 205.7 minutes, the patient slept for 201.5 minutes in REM and nonREM, yielding a sleep efficiency of 97.9%%. Sleep onset after CPAP initiation was 0.3 minutes with a REM latency of 21.0 minutes. The patient spent 3.2%% of the night in stage N1 sleep, 14.4%% in stage N2 sleep, 28.8%% in stage N3 and 53.6% in REM.  CARDIAC DATA The 2 lead EKG demonstrated sinus rhythm. The mean heart rate was 100.0 beats per minute. Other EKG findings include: PVCs.  LEG MOVEMENT DATA The total Periodic Limb Movements of Sleep (PLMS) were 18. The PLMS index was 3.0.  IMPRESSIONS 1. Severe obstructive sleep apnea occurred during the diagnostic portion of the study (AHI = 50.2/hour). The optimal CPAP selected for this patient is ( 14 cm of water). 2.  There was residual oxygen desaturation not associated  with the respiratory events even on optimal positive pressure.  This suggests hypoventilation syndrome.  Nocturnal oxygen on CPAP should be done approximately 1 month after being on positive pressure treatment.    Delano Metz, MD Diplomate, American Board of Sleep Medicine.  ELECTRONICALLY SIGNED ON:  07/28/2019, 6:44 PM [place] PH: [phone]   FX: [phone] [place]

## 2019-07-30 ENCOUNTER — Other Ambulatory Visit: Payer: Self-pay | Admitting: Vascular Surgery

## 2019-07-30 DIAGNOSIS — I87002 Postthrombotic syndrome without complications of left lower extremity: Secondary | ICD-10-CM

## 2019-07-30 DIAGNOSIS — I871 Compression of vein: Secondary | ICD-10-CM

## 2019-07-30 DIAGNOSIS — M7989 Other specified soft tissue disorders: Secondary | ICD-10-CM

## 2019-08-09 ENCOUNTER — Other Ambulatory Visit (HOSPITAL_COMMUNITY): Payer: Self-pay | Admitting: Psychiatry

## 2019-08-11 ENCOUNTER — Encounter (HOSPITAL_COMMUNITY): Payer: Self-pay | Admitting: Psychiatry

## 2019-08-11 ENCOUNTER — Ambulatory Visit (INDEPENDENT_AMBULATORY_CARE_PROVIDER_SITE_OTHER): Payer: Medicaid Other | Admitting: Psychiatry

## 2019-08-11 ENCOUNTER — Other Ambulatory Visit: Payer: Self-pay

## 2019-08-11 DIAGNOSIS — F3130 Bipolar disorder, current episode depressed, mild or moderate severity, unspecified: Secondary | ICD-10-CM

## 2019-08-11 MED ORDER — ALPRAZOLAM 1 MG PO TABS: 1.0000 mg | ORAL_TABLET | Freq: Two times a day (BID) | ORAL | 2 refills | Status: DC | PRN

## 2019-08-11 MED ORDER — AMPHETAMINE-DEXTROAMPHETAMINE 30 MG PO TABS: 30.0000 mg | ORAL_TABLET | Freq: Two times a day (BID) | ORAL | 0 refills | Status: DC

## 2019-08-11 MED ORDER — DULOXETINE HCL 60 MG PO CPEP: 60.0000 mg | ORAL_CAPSULE | Freq: Two times a day (BID) | ORAL | 2 refills | Status: DC

## 2019-08-11 NOTE — Progress Notes (Signed)
Virtual Visit via Video Note  I connected with Jane [name] on 08/11/19 at 11:20 AM EDT by a video enabled telemedicine application and verified that I am speaking with the correct person using two identifiers.   I discussed the limitations of evaluation and management by telemedicine and the availability of in person appointments. The patient expressed understanding and agreed to proceed.      I discussed the assessment and treatment plan with the patient. The patient was provided an opportunity to ask questions and all were answered. The patient agreed with the plan and demonstrated an understanding of the instructions.   The patient was advised to call back or seek an in-person evaluation if the symptoms worsen or if the condition fails to improve as anticipated.  I provided 15 minutes of non-face-to-face time during this encounter.   [name], MD  [facility] MD/PA/NP OP Progress Note  08/11/2019 11:55 AM Jane [name]  MRN:  [id number]  Chief Complaint:  Chief Complaint    Depression; Anxiety; Follow-up     HPI: This patient is a 53year-old divorced white female who lives alone in [place]. She is on disability due to bipolar disorder.  The patient states that she became very depressed when her mother died in 02/27/2000 of a brain aneurysm. She started getting. Sad but eventually became manic and develop mood swings and agitated behavior. She's been on numerous medications over the years. In Feb 27, 1988 she also had postpartum depression. For many years she was on lithium but it caused a bad taste in her mouth and her doctor was worried it might be starting to affect her renal function so was discontinued. She also thinks that it stopped working after a number of years  The patient returns for follow-up after 3 months.  She is continuing to have medical problems related to her vascular surgery.  She thinks she now has a blood clot behind her left knee and she also has another area on her leg  that keeps opening up.  She is going back to vascular surgery in 2 days.  She is on blood thinners.  She is frustrated because things do not seem to be progressing and she is constantly having to sit still and not go anywhere.  Her daughter and grandchildren have been visiting and helping her.  Overall her mood is stable.  She is focusing fairly well and sleeping well she denies any thoughts of self-harm or suicidal ideation Visit Diagnosis:    ICD-10-CM   1. Bipolar I disorder, most recent episode depressed ([place])  F31.30     Past Psychiatric History: Long-term outpatient treatment  Past Medical History:  Past Medical History:  Diagnosis Date  . Anxiety   . Bipolar disorder ([place])   . Chronic kidney disease    Stage 3 kidney disease;dx by Dr. Sinda Du.   . Constipation   . Depression   . Diabetes mellitus without complication ([place])   . Dyspnea    with exertion  . GERD (gastroesophageal reflux disease)   . Headache    migraines  . High cholesterol   . History of DVT (deep vein thrombosis)    left leg  . Hypertension    states under control with meds., has been on med. x 2 years  . Hypothyroidism   . Neuropathy   . Obsessive-compulsive disorder   . Peripheral vascular disease ([place])   . Restless leg syndrome   . Trigger thumb of left hand Feb 26, 2018  . Trigger thumb of  right hand     Past Surgical History:  Procedure Laterality Date  . ABDOMINAL HYSTERECTOMY  06/2016   complete  . APPLICATION OF WOUND VAC Left 04/27/2019   Procedure: APPLICATION OF WOUND VAC LEFT GROIN;  Surgeon: Waynetta Sandy, MD;  Location: [place];  Service: Vascular;  Laterality: Left;  . AV FISTULA PLACEMENT Left 11/24/2018   Procedure: ARTERIOVENOUS (AV) FISTULA CREATION LEFT SFA TO LEFT FEMORAL VEIN;  Surgeon: Waynetta Sandy, MD;  Location: [place];  Service: Vascular;  Laterality: Left;  . CHOLECYSTECTOMY    . FEMORAL ARTERY EXPLORATION  03/30/2019   Procedure: Left Common Femoral  Artery and Vein Exploration;  Surgeon: Waynetta Sandy, MD;  Location: [place];  Service: Vascular;;  . FEMORAL-FEMORAL BYPASS GRAFT Left 11/24/2018   Procedure: BYPASS GRAFT FEMORAL-FEMORAL VENOUS LEFT TO RIGHT PALMA PROCEDURE USING CRYOVEIN;  Surgeon: Waynetta Sandy, MD;  Location: [place];  Service: Vascular;  Laterality: Left;  . GROIN DEBRIDEMENT Left 04/27/2019   Procedure: GROIN DEBRIDEMENT;  Surgeon: Waynetta Sandy, MD;  Location: [place];  Service: Vascular;  Laterality: Left;  . INSERTION OF ILIAC STENT  03/30/2019   Procedure: Stent of left common, external iliac veins and left common femoral vein;  Surgeon: Waynetta Sandy, MD;  Location: [place];  Service: Vascular;;  . LOWER EXTREMITY VENOGRAPHY N/A 08/17/2018   Procedure: LOWER EXTREMITY VENOGRAPHY - Central Venogram;  Surgeon: Waynetta Sandy, MD;  Location: [place] [facility];  Service: Cardiovascular;  Laterality: N/A;  . LOWER EXTREMITY VENOGRAPHY Bilateral 03/09/2019   Procedure: LOWER EXTREMITY VENOGRAPHY;  Surgeon: Waynetta Sandy, MD;  Location: [place] [facility];  Service: Cardiovascular;  Laterality: Bilateral;  . LUMBAR FUSION  11/21/2000   L5-S1  . LUMBAR SPINE SURGERY     x 2 others  . PATCH ANGIOPLASTY Left 03/30/2019   Procedure: Patch Angioplasty of the Left Common Femoral Vein using Venosure Biologic patch;  Surgeon: Waynetta Sandy, MD;  Location: [place];  Service: Vascular;  Laterality: Left;  . TRIGGER FINGER RELEASE Right 12/01/2017   Procedure: RELEASE TRIGGER FINGER/A-1 PULLEY RIGHT THUMB;  Surgeon: Leanora Cover, MD;  Location: [place];  Service: Orthopedics;  Laterality: Right;  . TRIGGER FINGER RELEASE Left 01/26/2018   Procedure: LEFT TRIGGER THUMB RELEASE;  Surgeon: Leanora Cover, MD;  Location: [place];  Service: Orthopedics;  Laterality: Left;  . ULTRASOUND GUIDANCE FOR VASCULAR ACCESS Right 03/30/2019    Procedure: Ultrasound-guided cannulation right internal jugular vein;  Surgeon: Waynetta Sandy, MD;  [facility];  Service: Vascular;  Laterality: Right;    Family Psychiatric History: see below  Family History:  Family History  Problem Relation Age of Onset  . Heart disease Mother   . Hyperlipidemia Mother   . Hypertension Mother   . Bipolar disorder Mother   . Diabetes Father   . Heart disease Father   . Hyperlipidemia Father   . Hypertension Father   . Drug abuse Daughter   . ADD / ADHD Daughter   . Drug abuse Daughter   . Anxiety disorder Daughter   . Bipolar disorder Daughter   . Hypertension Sister   . Hypertension Brother   . Hyperlipidemia Brother   . Heart disease Brother   . Bipolar disorder Maternal Aunt   . Suicidality Maternal Aunt     Social History:  Social History   Socioeconomic History  . Marital status: Divorced    Spouse name: Not  on file  . Number of children: Not on file  . Years of education: Not on file  . Highest education level: Not on file  Occupational History  . Not on file  Social Needs  . Financial resource strain: Not on file  . Food insecurity    Worry: Not on file    Inability: Not on file  . Transportation needs    Medical: Not on file    Non-medical: Not on file  Tobacco Use  . Smoking status: Never Smoker  . Smokeless tobacco: Never Used  Substance and Sexual Activity  . Alcohol use: No  . Drug use: No  . Sexual activity: Yes    Partners: Male    Birth control/protection: Surgical  Lifestyle  . Physical activity    Days per week: Not on file    Minutes per session: Not on file  . Stress: Not on file  Relationships  . Social Herbalist on phone: Not on file    Gets together: Not on file    Attends religious service: Not on file    Active member of club or organization: Not on file    Attends meetings of clubs or organizations: Not on file    Relationship status: Not on file  Other Topics  Concern  . Not on file  Social History Narrative  . Not on file    Allergies:  Allergies  Allergen Reactions  . Aripiprazole Other (See Comments)    BECOMES  VIOLENT   . Seroquel [Quetiapine Fumarate] Other (See Comments)    BECOMES VIOLENT  . Chlorpromazine Other (See Comments)    SEVERE ANXIETY   . Gabapentin Other (See Comments)    NIGHTMARES     Metabolic Disorder Labs: Lab Results  Component Value Date   HGBA1C 6.3 (H) 03/26/2019   MPG 134.11 03/26/2019   No results found for: PROLACTIN No results found for: CHOL, TRIG, HDL, CHOLHDL, VLDL, LDLCALC No results found for: TSH  Therapeutic Level Labs: No results found for: LITHIUM Lab Results  Component Value Date   VALPROATE 61.2 03/23/2014   No components found for:  CBMZ  Current Medications: Current Outpatient Medications  Medication Sig Dispense Refill  . ALPRAZolam (XANAX) 1 MG tablet Take 1 tablet (1 mg total) by mouth 2 (two) times daily as needed for anxiety. 60 tablet 2  . amphetamine-dextroamphetamine (ADDERALL) 30 MG tablet Take 1 tablet by mouth 2 (two) times daily. Fill after 10/27/18 60 tablet 0  . amphetamine-dextroamphetamine (ADDERALL) 30 MG tablet Take 1 tablet by mouth 2 (two) times daily. 60 tablet 0  . amphetamine-dextroamphetamine (ADDERALL) 30 MG tablet Take 1 tablet by mouth 2 (two) times daily. 60 tablet 0  . aspirin EC 81 MG tablet Take 81 mg by mouth daily.    Marland Kitchen atorvastatin (LIPITOR) 20 MG tablet Take 20 mg by mouth daily.    . cetirizine (ZYRTEC) 10 MG tablet Take 10 mg by mouth daily.    . Cholecalciferol (VITAMIN D3) 125 MCG (5000 UT) CAPS Take 5,000 Units by mouth at bedtime.    . cycloSPORINE (RESTASIS) 0.05 % ophthalmic emulsion Place 1 drop into both eyes 2 (two) times daily.    . DULoxetine (CYMBALTA) 60 MG capsule Take 1 capsule (60 mg total) by mouth 2 (two) times daily. 60 capsule 2  . furosemide (LASIX) 40 MG tablet Take 40 mg by mouth as needed for fluid.     Marland Kitchen glipiZIDE  (GLUCOTROL XL) 5  MG 24 hr tablet Take 5 mg by mouth daily with breakfast.    . hydrochlorothiazide (HYDRODIURIL) 25 MG tablet Take 25 mg by mouth daily.    Marland Kitchen HYDROcodone-acetaminophen (NORCO) 10-325 MG tablet Take 1 tablet by mouth every 6 (six) hours as needed (for pain.).    Marland Kitchen levothyroxine (SYNTHROID, LEVOTHROID) 137 MCG tablet Take 137 mcg by mouth daily before breakfast.    . linaclotide (LINZESS) 145 MCG CAPS capsule Take 145 mcg by mouth daily.     . Magnesium 250 MG TABS Take 250 mg by mouth daily.    . Melatonin 10 MG TBDP Take 10 mg by mouth at bedtime as needed (for sleep).    . Multiple Vitamin (MULTIVITAMIN WITH MINERALS) TABS tablet Take 1 tablet by mouth daily. One-A-Day Women's Multivitamin    . Omega-3 Fatty Acids (FISH OIL) 1000 MG CAPS Take 1,000 mg by mouth daily.     . pantoprazole (PROTONIX) 40 MG tablet Take 40 mg by mouth daily.    Marland Kitchen PAZEO 0.7 % SOLN Place 1 drop into both eyes daily.     . Polyvinyl Alcohol-Povidone (REFRESH OP) Place 1 drop into both eyes 3 (three) times daily.     . potassium chloride (K-DUR) 10 MEQ tablet Take 10 mEq by mouth 3 (three) times daily.    . pregabalin (LYRICA) 100 MG capsule Take 100 mg by mouth 3 (three) times daily.    . rivaroxaban (XARELTO) 20 MG TABS tablet Take 1 tablet (20 mg total) by mouth daily with supper. 30 tablet 6  . rOPINIRole (REQUIP) 2 MG tablet Take 2 mg by mouth 2 (two) times daily.    Marland Kitchen triamcinolone (KENALOG) 0.1 % paste Use as directed 1 application in the mouth or throat 4 (four) times daily as needed (mouth ulcers).    . vitamin C (ASCORBIC ACID) 500 MG tablet Take 500 mg by mouth at bedtime.     No current facility-administered medications for this visit.      Musculoskeletal: Strength & Muscle Tone: within normal limits Gait & Station: normal Patient leans: N/A  Psychiatric Specialty Exam: Review of Systems  Cardiovascular: Positive for leg swelling and PND.  All other systems reviewed and are  negative.   There were no vitals taken for this visit.There is no height or weight on file to calculate BMI.  General Appearance: Casual and Fairly Groomed  Eye Contact:  Good  Speech:  Clear and Coherent  Volume:  Normal  Mood:  Anxious  Affect:  Appropriate and Congruent  Thought Process:  Goal Directed  Orientation:  Full (Time, Place, and Person)  Thought Content: Rumination   Suicidal Thoughts:  No  Homicidal Thoughts:  No  Memory:  Immediate;   Good Recent;   Good Remote;   Good  Judgement:  Good  Insight:  Fair  Psychomotor Activity:  Decreased  Concentration:  Concentration: Good and Attention Span: Good  Recall:  Good  Fund of Knowledge: Good  Language: Good  Akathisia:  No  Handed:  Right  AIMS (if indicated): not done  Assets:  Communication Skills Desire for Improvement Resilience Social Support Talents/Skills  ADL's:  Intact  Cognition: WNL  Sleep:  Good   Screenings: PHQ2-9     Nutrition from 09/22/2014 in [facility]  PHQ-2 Total Score  4  PHQ-9 Total Score  15       Assessment and Plan: This patient is a 53 year old female with a history of depression anxiety  and ADD.  She is still struggling with many medical issues related to her vascular surgery.  She does think her mood has been stable.  For now she will continue Xanax 1 mg twice daily as needed for anxiety, Cymbalta 60 mg twice daily for depression and Adderall 30 mg twice daily for ADD.  She will return to see me in 3 months   [name], MD 08/11/2019, 11:55 AM

## 2019-08-13 ENCOUNTER — Ambulatory Visit (INDEPENDENT_AMBULATORY_CARE_PROVIDER_SITE_OTHER): Payer: Medicaid Other | Admitting: Vascular Surgery

## 2019-08-13 ENCOUNTER — Encounter: Payer: Self-pay | Admitting: Vascular Surgery

## 2019-08-13 ENCOUNTER — Other Ambulatory Visit: Payer: Self-pay

## 2019-08-13 ENCOUNTER — Other Ambulatory Visit (HOSPITAL_COMMUNITY)
Admission: RE | Admit: 2019-08-13 | Discharge: 2019-08-13 | Disposition: A | Discharge status: Home / Self Care | Payer: Medicaid Other | Source: Ambulatory Visit | Attending: Vascular Surgery | Admitting: Vascular Surgery

## 2019-08-13 VITALS — BP 146/80 | HR 81 | Temp 97.5°F | Resp 20 | Ht 70.0 in | Wt 281.0 lb

## 2019-08-13 DIAGNOSIS — Z20828 Contact with and (suspected) exposure to other viral communicable diseases: Secondary | ICD-10-CM | POA: Insufficient documentation

## 2019-08-13 DIAGNOSIS — Z01812 Encounter for preprocedural laboratory examination: Secondary | ICD-10-CM | POA: Insufficient documentation

## 2019-08-13 DIAGNOSIS — M7989 Other specified soft tissue disorders: Secondary | ICD-10-CM | POA: Diagnosis not present

## 2019-08-13 DIAGNOSIS — I871 Compression of vein: Secondary | ICD-10-CM

## 2019-08-13 NOTE — Progress Notes (Signed)
Patient ID: Jane [name], female   DOB: 1966-09-24, 53 y.o.   MRN: [id number]  Reason for Consult: Follow-up   Referred by Sinda Du, MD  Subjective:     HPI:  Jane [name] is a 53 y.o. 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  More recently she underwent patch angioplasty of the left common femoral vein with stent of the left common external leg veins as well as the common femoral vein had to have a covered stent for rupture.  She subsequently had improved swelling.  She now returns with 1 month of much worsening swelling.  She has significant pain.  She shows a skin tear on her leg as well from minimal trauma.  She continues to take Xarelto and aspirin.  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  Swelling causes quite a bit of discomfort.  States that her leg feels very heavy.  Her knee is very tight when she stands.  Leg is worse throughout the day.  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  Past Medical History:  Diagnosis Date  . Anxiety   . Bipolar disorder ([place])   . Chronic kidney disease    Stage 3 kidney disease;dx by Dr. Sinda Du.   . Constipation   . Depression   . Diabetes mellitus without complication ([place])   . Dyspnea    with exertion  . GERD (gastroesophageal reflux disease)   . Headache    migraines  . High cholesterol   . History of DVT (deep vein thrombosis)    left leg  . Hypertension    states under control with meds., has been on med. x 2 years  . Hypothyroidism   . Neuropathy   . Obsessive-compulsive disorder   . Peripheral vascular disease ([place])   . Restless leg syndrome   . Trigger thumb of left hand 01/2018  . Trigger thumb of right hand    Family History  Problem Relation Age of Onset  . Heart disease Mother   . Hyperlipidemia Mother   . Hypertension Mother   . Bipolar disorder Mother   .  Diabetes Father   . Heart disease Father   . Hyperlipidemia Father   . Hypertension Father   . Drug abuse Daughter   . ADD / ADHD Daughter   . Drug abuse Daughter   . Anxiety disorder Daughter   . Bipolar disorder Daughter   . Hypertension Sister   . Hypertension Brother   . Hyperlipidemia Brother   . Heart disease Brother   . Bipolar disorder Maternal Aunt   . Suicidality Maternal Aunt    Past Surgical History:  Procedure Laterality Date  . ABDOMINAL HYSTERECTOMY  06/2016   complete  . APPLICATION OF WOUND VAC Left 04/27/2019   Procedure: APPLICATION OF WOUND VAC LEFT GROIN;  Surgeon: Waynetta Sandy, MD;  Location: [place];  Service: Vascular;  Laterality: Left;  . AV FISTULA PLACEMENT Left 11/24/2018   Procedure: ARTERIOVENOUS (AV) FISTULA CREATION LEFT SFA TO LEFT FEMORAL VEIN;  Surgeon: Waynetta Sandy, MD;  Location: [place];  Service: Vascular;  Laterality: Left;  . CHOLECYSTECTOMY    . FEMORAL ARTERY EXPLORATION  03/30/2019   Procedure: Left Common Femoral Artery and Vein Exploration;  Surgeon: Waynetta Sandy, MD;  Location: [place];  Service: Vascular;;  . FEMORAL-FEMORAL BYPASS GRAFT Left 11/24/2018   Procedure: BYPASS GRAFT FEMORAL-FEMORAL VENOUS LEFT  TO RIGHT PALMA PROCEDURE USING CRYOVEIN;  Surgeon: Waynetta Sandy, MD;  Location: [place];  Service: Vascular;  Laterality: Left;  . GROIN DEBRIDEMENT Left 04/27/2019   Procedure: GROIN DEBRIDEMENT;  Surgeon: Waynetta Sandy, MD;  Location: [place];  Service: Vascular;  Laterality: Left;  . INSERTION OF ILIAC STENT  03/30/2019   Procedure: Stent of left common, external iliac veins and left common femoral vein;  Surgeon: Waynetta Sandy, MD;  Location: [place];  Service: Vascular;;  . LOWER EXTREMITY VENOGRAPHY N/A 08/17/2018   Procedure: LOWER EXTREMITY VENOGRAPHY - Central Venogram;  Surgeon: Waynetta Sandy, MD;  Location: [place] [facility];  Service: Cardiovascular;   Laterality: N/A;  . LOWER EXTREMITY VENOGRAPHY Bilateral 03/09/2019   Procedure: LOWER EXTREMITY VENOGRAPHY;  Surgeon: Waynetta Sandy, MD;  Location: [place] [facility];  Service: Cardiovascular;  Laterality: Bilateral;  . LUMBAR FUSION  11/21/2000   L5-S1  . LUMBAR SPINE SURGERY     x 2 others  . PATCH ANGIOPLASTY Left 03/30/2019   Procedure: Patch Angioplasty of the Left Common Femoral Vein using Venosure Biologic patch;  Surgeon: Waynetta Sandy, MD;  Location: [place];  Service: Vascular;  Laterality: Left;  . TRIGGER FINGER RELEASE Right 12/01/2017   Procedure: RELEASE TRIGGER FINGER/A-1 PULLEY RIGHT THUMB;  Surgeon: Leanora Cover, MD;  Location: [place];  Service: Orthopedics;  Laterality: Right;  . TRIGGER FINGER RELEASE Left 01/26/2018   Procedure: LEFT TRIGGER THUMB RELEASE;  Surgeon: Leanora Cover, MD;  Location: [place];  Service: Orthopedics;  Laterality: Left;  . ULTRASOUND GUIDANCE FOR VASCULAR ACCESS Right 03/30/2019   Procedure: Ultrasound-guided cannulation right internal jugular vein;  Surgeon: Waynetta Sandy, MD;  Location: [place];  Service: Vascular;  Laterality: Right;    Short Social History:  Social History   Tobacco Use  . Smoking status: Never Smoker  . Smokeless tobacco: Never Used  Substance Use Topics  . Alcohol use: No    Allergies  Allergen Reactions  . Aripiprazole Other (See Comments)    BECOMES  VIOLENT   . Seroquel [Quetiapine Fumarate] Other (See Comments)    BECOMES VIOLENT  . Chlorpromazine Other (See Comments)    SEVERE ANXIETY   . Gabapentin Other (See Comments)    NIGHTMARES     Current Outpatient Medications  Medication Sig Dispense Refill  . ALPRAZolam (XANAX) 1 MG tablet Take 1 tablet (1 mg total) by mouth 2 (two) times daily as needed for anxiety. 60 tablet 2  . amphetamine-dextroamphetamine (ADDERALL) 30 MG tablet Take 1 tablet by mouth 2 (two) times daily. 60 tablet 0   . aspirin EC 81 MG tablet Take 81 mg by mouth daily.    Marland Kitchen atorvastatin (LIPITOR) 20 MG tablet Take 20 mg by mouth daily.    . cetirizine (ZYRTEC) 10 MG tablet Take 10 mg by mouth daily.    . Cholecalciferol (VITAMIN D3) 125 MCG (5000 UT) CAPS Take 5,000 Units by mouth at bedtime.    . cycloSPORINE (RESTASIS) 0.05 % ophthalmic emulsion Place 1 drop into both eyes 2 (two) times daily.    . DULoxetine (CYMBALTA) 60 MG capsule Take 1 capsule (60 mg total) by mouth 2 (two) times daily. 60 capsule 2  . furosemide (LASIX) 40 MG tablet Take 40 mg by mouth as needed for fluid.     Marland Kitchen glipiZIDE (GLUCOTROL XL) 5 MG 24 hr tablet Take 5 mg by mouth daily with breakfast.    .  hydrochlorothiazide (HYDRODIURIL) 25 MG tablet Take 25 mg by mouth daily.    Marland Kitchen HYDROcodone-acetaminophen (NORCO) 10-325 MG tablet Take 1 tablet by mouth every 6 (six) hours as needed (for pain.).    Marland Kitchen levothyroxine (SYNTHROID, LEVOTHROID) 137 MCG tablet Take 137 mcg by mouth daily before breakfast.    . linaclotide (LINZESS) 145 MCG CAPS capsule Take 145 mcg by mouth daily.     . Magnesium 250 MG TABS Take 250 mg by mouth daily.    . Melatonin 10 MG TBDP Take 10 mg by mouth at bedtime as needed (for sleep).    . Multiple Vitamin (MULTIVITAMIN WITH MINERALS) TABS tablet Take 1 tablet by mouth daily. One-A-Day Women's Multivitamin    . Omega-3 Fatty Acids (FISH OIL) 1000 MG CAPS Take 1,000 mg by mouth daily.     . pantoprazole (PROTONIX) 40 MG tablet Take 40 mg by mouth daily.    Marland Kitchen PAZEO 0.7 % SOLN Place 1 drop into both eyes daily.     . Polyvinyl Alcohol-Povidone (REFRESH OP) Place 1 drop into both eyes 3 (three) times daily.     . potassium chloride (K-DUR) 10 MEQ tablet Take 10 mEq by mouth 3 (three) times daily.    . pregabalin (LYRICA) 100 MG capsule Take 100 mg by mouth 3 (three) times daily.    . rivaroxaban (XARELTO) 20 MG TABS tablet Take 1 tablet (20 mg total) by mouth daily with supper. 30 tablet 6  . rOPINIRole (REQUIP) 2 MG  tablet Take 2 mg by mouth 2 (two) times daily.    Marland Kitchen triamcinolone (KENALOG) 0.1 % paste Use as directed 1 application in the mouth or throat 4 (four) times daily as needed (mouth ulcers).    . vitamin C (ASCORBIC ACID) 500 MG tablet Take 500 mg by mouth at bedtime.    Marland Kitchen amphetamine-dextroamphetamine (ADDERALL) 30 MG tablet Take 1 tablet by mouth 2 (two) times daily. Fill after 10/27/18 60 tablet 0  . amphetamine-dextroamphetamine (ADDERALL) 30 MG tablet Take 1 tablet by mouth 2 (two) times daily. 60 tablet 0   No current facility-administered medications for this visit.     Review of Systems  Constitutional:  Constitutional negative. HENT: HENT negative.  Eyes: Eyes negative.  Cardiovascular: Positive for leg swelling.  GI: Gastrointestinal negative.  Musculoskeletal: Musculoskeletal negative.  Skin: Skin negative.  Neurological: Neurological negative. Hematologic: Hematologic/lymphatic negative.  Psychiatric: Psychiatric negative.        Objective:  Objective   Vitals:   08/13/19 1010  BP: (!) 146/80  Pulse: 81  Resp: 20  Temp: (!) 97.5 F (36.4 C)  SpO2: 98%  Weight: 281 lb (127.5 kg)  Height: 5\' 10"  (1.778 m)   Body mass index is 40.32 kg/m.  Physical Exam HENT:     Head: Normocephalic.     Nose: Nose normal.  Neck:     Musculoskeletal: Neck supple.  Cardiovascular:     Rate and Rhythm: Normal rate.  Pulmonary:     Effort: Pulmonary effort is normal.  Abdominal:     General: Abdomen is flat.     Palpations: Abdomen is soft. There is no mass.  Musculoskeletal: Normal range of motion.     Right lower leg: No edema.     Left lower leg: Edema present.     Comments: Right leg 40 cm diameter, left leg 47 cm diameter  Skin:    General: Skin is warm and dry.     Capillary Refill: Capillary refill takes less  than 2 seconds.     Comments: Small skin tear left anterior leg  Neurological:     General: No focal deficit present.  Psychiatric:        Mood and  Affect: Mood normal.        Thought Content: Thought content normal.        Judgment: Judgment normal.     Data: Outside duplex reviewed demonstrates concern for thrombosis of common femoral and femoral veins     Assessment/Plan:     53 year old female with extensive left lower extremity venous intervention.  Recent duplex adjusted rethrombosis of her stents however possibly is just a low flow state.  Left leg is 20% larger than right she is significantly symptomatic.  I have offered her continued medical therapy versus CT venogram versus left lower extremity venography which she has chosen.  We will plan to get this done in a few days from a prone position left low extremity venogram with intravascular ultrasound and possible intervention of occluded stents.  She demonstrates good understanding.  She will need to stay overnight for observation due to transportation issues.     Waynetta Sandy MD [facility]

## 2019-08-16 ENCOUNTER — Other Ambulatory Visit: Payer: Self-pay

## 2019-08-16 ENCOUNTER — Inpatient Hospital Stay (HOSPITAL_COMMUNITY)
Admission: RE | Admit: 2019-08-16 | Discharge: 2019-08-18 | DRG: 253 | Disposition: A | Discharge status: Home / Self Care | Payer: Medicaid Other | Attending: Vascular Surgery | Admitting: Vascular Surgery

## 2019-08-16 ENCOUNTER — Encounter (HOSPITAL_COMMUNITY)
Admission: RE | Disposition: A | Discharge status: Home / Self Care | Payer: Self-pay | Source: Home / Self Care | Attending: Vascular Surgery

## 2019-08-16 DIAGNOSIS — I82402 Acute embolism and thrombosis of unspecified deep veins of left lower extremity: Principal | ICD-10-CM | POA: Diagnosis present

## 2019-08-16 DIAGNOSIS — T82856A Stenosis of peripheral vascular stent, initial encounter: Secondary | ICD-10-CM | POA: Diagnosis present

## 2019-08-16 DIAGNOSIS — Z86718 Personal history of other venous thrombosis and embolism: Secondary | ICD-10-CM | POA: Diagnosis present

## 2019-08-16 DIAGNOSIS — I82409 Acute embolism and thrombosis of unspecified deep veins of unspecified lower extremity: Secondary | ICD-10-CM | POA: Diagnosis present

## 2019-08-16 HISTORY — PX: LOWER EXTREMITY VENOGRAPHY: CATH118253

## 2019-08-16 HISTORY — PX: PERIPHERAL VASCULAR INTERVENTION: CATH118257

## 2019-08-16 LAB — GLUCOSE, CAPILLARY
Glucose-Capillary: 58 mg/dL — ABNORMAL LOW (ref 70–99)
Glucose-Capillary: 65 mg/dL — ABNORMAL LOW (ref 70–99)
Glucose-Capillary: 100 mg/dL — ABNORMAL HIGH (ref 70–99)
Glucose-Capillary: 125 mg/dL — ABNORMAL HIGH (ref 70–99)
Glucose-Capillary: 134 mg/dL — ABNORMAL HIGH (ref 70–99)

## 2019-08-16 LAB — POCT I-STAT, CHEM 8
BUN: 28 mg/dL — ABNORMAL HIGH (ref 6–20)
Calcium, Ion: 1.18 mmol/L (ref 1.15–1.40)
Chloride: 101 mmol/L (ref 98–111)
Creatinine, Ser: 1.3 mg/dL — ABNORMAL HIGH (ref 0.44–1.00)
Glucose, Bld: 115 mg/dL — ABNORMAL HIGH (ref 70–99)
HCT: 39 % (ref 36.0–46.0)
Hemoglobin: 13.3 g/dL (ref 12.0–15.0)
Potassium: 3.7 mmol/L (ref 3.5–5.1)
Sodium: 142 mmol/L (ref 135–145)
TCO2: 29 mmol/L (ref 22–32)

## 2019-08-16 LAB — NOVEL CORONAVIRUS, NAA (HOSP ORDER, SEND-OUT TO REF LAB; TAT 18-24 HRS): SARS-CoV-2, NAA: NOT DETECTED

## 2019-08-16 LAB — APTT: aPTT: 38 seconds — ABNORMAL HIGH (ref 24–36)

## 2019-08-16 SURGERY — LOWER EXTREMITY VENOGRAPHY
Anesthesia: LOCAL | Laterality: Left

## 2019-08-16 MED ORDER — MIDAZOLAM HCL 2 MG/2ML IJ SOLN
INTRAMUSCULAR | Status: AC
  Filled 2019-08-16: qty 2

## 2019-08-16 MED ORDER — HEPARIN SODIUM (PORCINE) 1000 UNIT/ML IJ SOLN
INTRAMUSCULAR | Status: AC
  Filled 2019-08-16: qty 1

## 2019-08-16 MED ORDER — LINACLOTIDE 145 MCG PO CAPS
145.0000 ug | ORAL_CAPSULE | Freq: Every day | ORAL | Status: DC
  Administered 2019-08-17 – 2019-08-18 (×2): 145 ug via ORAL
  Filled 2019-08-16 (×2): qty 1

## 2019-08-16 MED ORDER — MELATONIN 3 MG PO TABS
9.0000 mg | ORAL_TABLET | Freq: Every evening | ORAL | Status: DC | PRN
  Filled 2019-08-16: qty 3

## 2019-08-16 MED ORDER — HEPARIN (PORCINE) IN NACL 1000-0.9 UT/500ML-% IV SOLN
INTRAVENOUS | Status: AC
  Filled 2019-08-16: qty 500

## 2019-08-16 MED ORDER — MORPHINE SULFATE (PF) 2 MG/ML IV SOLN
2.0000 mg | INTRAVENOUS | Status: DC | PRN
  Administered 2019-08-16 (×2): 2 mg via INTRAVENOUS
  Filled 2019-08-16: qty 1

## 2019-08-16 MED ORDER — ONDANSETRON HCL 4 MG/2ML IJ SOLN: 4.0000 mg | Freq: Four times a day (QID) | INTRAMUSCULAR | Status: DC | PRN

## 2019-08-16 MED ORDER — ASPIRIN EC 81 MG PO TBEC
81.0000 mg | DELAYED_RELEASE_TABLET | Freq: Every day | ORAL | Status: DC
  Administered 2019-08-17 – 2019-08-18 (×2): 81 mg via ORAL
  Filled 2019-08-16 (×2): qty 1

## 2019-08-16 MED ORDER — MORPHINE SULFATE (PF) 2 MG/ML IV SOLN
INTRAVENOUS | Status: AC
  Filled 2019-08-16: qty 1

## 2019-08-16 MED ORDER — ADULT MULTIVITAMIN W/MINERALS CH
1.0000 | ORAL_TABLET | Freq: Every day | ORAL | Status: DC
  Administered 2019-08-17 – 2019-08-18 (×2): 1 via ORAL
  Filled 2019-08-16 (×2): qty 1

## 2019-08-16 MED ORDER — SODIUM CHLORIDE 0.9% FLUSH: 3.0000 mL | INTRAVENOUS | Status: DC | PRN

## 2019-08-16 MED ORDER — IODIXANOL 320 MG/ML IV SOLN
INTRAVENOUS | Status: DC | PRN
  Administered 2019-08-16: 60 mL via INTRAVENOUS

## 2019-08-16 MED ORDER — ATORVASTATIN CALCIUM 10 MG PO TABS
20.0000 mg | ORAL_TABLET | Freq: Every day | ORAL | Status: DC
  Administered 2019-08-17 – 2019-08-18 (×2): 20 mg via ORAL
  Filled 2019-08-16 (×2): qty 2

## 2019-08-16 MED ORDER — PREGABALIN 100 MG PO CAPS
100.0000 mg | ORAL_CAPSULE | Freq: Three times a day (TID) | ORAL | Status: DC
  Administered 2019-08-16 – 2019-08-18 (×5): 100 mg via ORAL
  Filled 2019-08-16 (×6): qty 1

## 2019-08-16 MED ORDER — RIVAROXABAN 20 MG PO TABS
20.0000 mg | ORAL_TABLET | Freq: Every day | ORAL | Status: DC
  Filled 2019-08-16: qty 1

## 2019-08-16 MED ORDER — POTASSIUM CHLORIDE ER 10 MEQ PO TBCR
10.0000 meq | EXTENDED_RELEASE_TABLET | Freq: Three times a day (TID) | ORAL | Status: DC
  Administered 2019-08-16 – 2019-08-18 (×5): 10 meq via ORAL
  Filled 2019-08-16 (×12): qty 1

## 2019-08-16 MED ORDER — LEVOTHYROXINE SODIUM 25 MCG PO TABS
137.0000 ug | ORAL_TABLET | Freq: Every day | ORAL | Status: DC
  Administered 2019-08-17 – 2019-08-18 (×2): 137 ug via ORAL
  Filled 2019-08-16 (×2): qty 1

## 2019-08-16 MED ORDER — LIDOCAINE HCL (PF) 1 % IJ SOLN
INTRAMUSCULAR | Status: DC | PRN
  Administered 2019-08-16: 5 mL
  Administered 2019-08-16: 15 mL

## 2019-08-16 MED ORDER — OXYCODONE HCL 5 MG PO TABS
5.0000 mg | ORAL_TABLET | ORAL | Status: DC | PRN
  Administered 2019-08-17: 5 mg via ORAL
  Filled 2019-08-16: qty 1

## 2019-08-16 MED ORDER — HEPARIN SODIUM (PORCINE) 1000 UNIT/ML IJ SOLN
INTRAMUSCULAR | Status: DC | PRN
  Administered 2019-08-16: 10000 [IU] via INTRAVENOUS

## 2019-08-16 MED ORDER — GLIPIZIDE ER 5 MG PO TB24
5.0000 mg | ORAL_TABLET | Freq: Every day | ORAL | Status: DC
  Administered 2019-08-17 – 2019-08-18 (×2): 5 mg via ORAL
  Filled 2019-08-16 (×2): qty 1

## 2019-08-16 MED ORDER — FENTANYL CITRATE (PF) 100 MCG/2ML IJ SOLN
INTRAMUSCULAR | Status: AC
  Filled 2019-08-16: qty 2

## 2019-08-16 MED ORDER — HYDRALAZINE HCL 20 MG/ML IJ SOLN: 5.0000 mg | INTRAMUSCULAR | Status: DC | PRN

## 2019-08-16 MED ORDER — HEPARIN (PORCINE) 25000 UT/250ML-% IV SOLN
1250.0000 [IU]/h | INTRAVENOUS | Status: AC
  Administered 2019-08-16: 1000 [IU]/h via INTRAVENOUS
  Filled 2019-08-16: qty 250

## 2019-08-16 MED ORDER — SODIUM CHLORIDE 0.9 % IV SOLN: 250.0000 mL | INTRAVENOUS | Status: DC | PRN

## 2019-08-16 MED ORDER — SODIUM CHLORIDE 0.9% FLUSH: 3.0000 mL | Freq: Two times a day (BID) | INTRAVENOUS | Status: DC

## 2019-08-16 MED ORDER — HEPARIN (PORCINE) IN NACL 1000-0.9 UT/500ML-% IV SOLN
INTRAVENOUS | Status: DC | PRN
  Administered 2019-08-16: 500 mL

## 2019-08-16 MED ORDER — LIDOCAINE HCL (PF) 1 % IJ SOLN
INTRAMUSCULAR | Status: AC
  Filled 2019-08-16: qty 30

## 2019-08-16 MED ORDER — DULOXETINE HCL 60 MG PO CPEP
60.0000 mg | ORAL_CAPSULE | Freq: Two times a day (BID) | ORAL | Status: DC
  Administered 2019-08-16 – 2019-08-18 (×4): 60 mg via ORAL
  Filled 2019-08-16 (×4): qty 1

## 2019-08-16 MED ORDER — MIDAZOLAM HCL 2 MG/2ML IJ SOLN
INTRAMUSCULAR | Status: DC | PRN
  Administered 2019-08-16: 2 mg via INTRAVENOUS

## 2019-08-16 MED ORDER — SODIUM CHLORIDE 0.9 % IV SOLN
INTRAVENOUS | Status: DC
  Administered 2019-08-16: 10:00:00 via INTRAVENOUS

## 2019-08-16 MED ORDER — VITAMIN D 25 MCG (1000 UNIT) PO TABS
5000.0000 [IU] | ORAL_TABLET | Freq: Every day | ORAL | Status: DC
  Administered 2019-08-16 – 2019-08-17 (×2): 5000 [IU] via ORAL
  Filled 2019-08-16 (×2): qty 5

## 2019-08-16 MED ORDER — TRIAMCINOLONE ACETONIDE 0.1 % MT PSTE
1.0000 "application " | PASTE | Freq: Four times a day (QID) | OROMUCOSAL | Status: DC | PRN
  Filled 2019-08-16: qty 5

## 2019-08-16 MED ORDER — SODIUM CHLORIDE 0.9 % IV SOLN: INTRAVENOUS | Status: AC

## 2019-08-16 MED ORDER — MAGNESIUM OXIDE 400 (241.3 MG) MG PO TABS
200.0000 mg | ORAL_TABLET | Freq: Every day | ORAL | Status: DC
  Administered 2019-08-17 – 2019-08-18 (×2): 200 mg via ORAL
  Filled 2019-08-16 (×2): qty 1

## 2019-08-16 MED ORDER — ROPINIROLE HCL 1 MG PO TABS
2.0000 mg | ORAL_TABLET | Freq: Two times a day (BID) | ORAL | Status: DC
  Administered 2019-08-16 – 2019-08-18 (×4): 2 mg via ORAL
  Filled 2019-08-16 (×4): qty 2

## 2019-08-16 MED ORDER — PANTOPRAZOLE SODIUM 40 MG PO TBEC
40.0000 mg | DELAYED_RELEASE_TABLET | Freq: Every day | ORAL | Status: DC
  Administered 2019-08-17 – 2019-08-18 (×2): 40 mg via ORAL
  Filled 2019-08-16 (×2): qty 1

## 2019-08-16 MED ORDER — LABETALOL HCL 5 MG/ML IV SOLN: 10.0000 mg | INTRAVENOUS | Status: DC | PRN

## 2019-08-16 MED ORDER — FENTANYL CITRATE (PF) 100 MCG/2ML IJ SOLN
INTRAMUSCULAR | Status: DC | PRN
  Administered 2019-08-16 (×2): 50 ug via INTRAVENOUS

## 2019-08-16 MED ORDER — VITAMIN C 500 MG PO TABS
500.0000 mg | ORAL_TABLET | Freq: Every day | ORAL | Status: DC
  Administered 2019-08-16 – 2019-08-17 (×2): 500 mg via ORAL
  Filled 2019-08-16 (×2): qty 1

## 2019-08-16 MED ORDER — ACETAMINOPHEN 325 MG PO TABS: 650.0000 mg | ORAL_TABLET | ORAL | Status: DC | PRN

## 2019-08-16 MED ORDER — HYDROCODONE-ACETAMINOPHEN 10-325 MG PO TABS
1.0000 | ORAL_TABLET | Freq: Four times a day (QID) | ORAL | Status: DC | PRN
  Administered 2019-08-17: 1 via ORAL
  Filled 2019-08-16: qty 1

## 2019-08-16 MED ORDER — CYCLOSPORINE 0.05 % OP EMUL
1.0000 [drp] | Freq: Two times a day (BID) | OPHTHALMIC | Status: DC
  Administered 2019-08-17 – 2019-08-18 (×3): 1 [drp] via OPHTHALMIC
  Filled 2019-08-16 (×5): qty 30

## 2019-08-16 SURGICAL SUPPLY — 14 items
BAG SNAP BAND KOVER 36X36 (MISCELLANEOUS) ×2 IMPLANT
BALLN MUSTANG 12X60X75 (BALLOONS) ×2
BALLOON MUSTANG 12X60X75 (BALLOONS) ×1 IMPLANT
CATH ANGIO 5F BER2 100CM (CATHETERS) ×2 IMPLANT
CATH VISIONS PV .035 IVUS (CATHETERS) ×2 IMPLANT
COVER DOME SNAP 22 D (MISCELLANEOUS) ×2 IMPLANT
GLIDEWIRE ADV .035X260CM (WIRE) ×2 IMPLANT
KIT ENCORE 26 ADVANTAGE (KITS) ×2 IMPLANT
KIT MICROPUNCTURE NIT STIFF (SHEATH) ×2 IMPLANT
SHEATH PINNACLE 8F 10CM (SHEATH) ×2 IMPLANT
SHEATH PINNACLE 9F 10CM (SHEATH) ×2 IMPLANT
SHEATH PROBE COVER 6X72 (BAG) ×2 IMPLANT
STENT VICI VENOUS 12X90 (Permanent Stent) ×2 IMPLANT
TRAY PV CATH (CUSTOM PROCEDURE TRAY) ×2 IMPLANT

## 2019-08-16 NOTE — Op Note (Signed)
Patient name: Jane [name] MRN: [id number] DOB: 1966-09-20 Sex: female  08/16/2019 Pre-operative Diagnosis: Recurrent left lower extremity DVT Post-operative diagnosis:  Same Surgeon:  Erlene Quan C. Donzetta Matters, MD Procedure Performed: 1.  Ultrasound-guided cannulation left small saphenous vein 2.  Left lower extremity and central venography 3.  Intravascular ultrasound IVC, left common external iliac vein stents, common femoral vein and femoral vein on the left 4.  Balloon angioplasty of left common external iliac vein stent stenosis 5.  Stent of left common femoral and femoral vein with 12 x 90 mm Vici 6.  Moderate sedation with fentanyl Versed for 51 minutes  Indications: 53 year old female with history of extensive left lower extremity venous disease.  She is now recurrent swelling and concern for occlusion of her stents.  She is indicated for venogram with intravascular ultrasound possible intervention.  Findings: Femoral vein was patent although runoff is via the profunda as there appeared to be high resistance outflow of the femoral.  Centrally the stents were patent.  The most distal covered stent was occluded in the common femoral vein.  It appeared that the profunda was already jailed by the stent.  After balloon angioplasty we then placed a new stent across the occlusion we now had a 10.5 mm diameter lumen and the stent where previously were occluded.  There was an area within the most central covered stent and the previous Vici that had a stenosis this was ballooned did not really resolve.  At completion she did have inline flow through her femoral vein into her stents and centrally.   Procedure:  The patient was identified in the holding area and taken to room 8.  The patient was then placed supine on the table and prepped and draped in the usual sterile fashion.  A time out was called.  Ultrasound was used to evaluate the small saphenous vein which was noted to be patent although  somewhat difficult to compress.  There is anesthetized 1% lidocaine.  I cannulated with micropuncture needle followed wire and sheath with direct ultrasound visualization.  Images saved the permanent record.  I then performed venography of the left lower extremity.  I exchanged over a Glidewire advantage for 8 French sheath and the patient was fully heparinized.  I used very catheter Glidewire vanished to get through essentially perform central venography.  I then performed intravascular ultrasound from the IVC all the way through the femoral vein on the left.  This demonstrated patent stents however there was an occlusion most distal covered stent.  I then placed the catheter into the stents performed venography which demonstrated this to was patent.  Unfortunately with venography had had flow going through the profunda nothing through the femoral vein so this was difficult evaluate.  I then balloon angioplasty the distal stent where was occluded and placed a 12 x 90 mm Vici stent.  This was postdilated with a 12.  I now had flow of the venography.  I did have 1 area within the common external leg vein stents that I ballooned.  Completion intravascular ultrasound demonstrated no further residual stenosis or occlusions.  Completion venography demonstrated flow through the femoral vein into the stents.  Satisfied with this we removed our wires.  A bolster stitch was placed in the popliteal fossae overlying the cannulation site the sheath was removed.  She tolerated procedure without immediate complication.  Contrast: 60cc   [name] C. Donzetta Matters, MD [facility] [place] Office: [phone] Pager: [phone]

## 2019-08-16 NOTE — H&P (Signed)
   History and Physical Update  The patient was interviewed and re-examined.  The patient's previous History and Physical has been reviewed and is unchanged from recent office visit. Plan for left lower extremity venography.   Jane [name] C. Donzetta Matters, MD [facility] [place] Office: [phone] Pager: [phone]  08/16/2019, 11:37 AM

## 2019-08-16 NOTE — Progress Notes (Addendum)
ANTICOAGULATION CONSULT NOTE - Initial Consult  Pharmacy Consult for heparin Indication: VTE  Allergies  Allergen Reactions  . Aripiprazole Other (See Comments)    BECOMES  VIOLENT   . Seroquel [Quetiapine Fumarate] Other (See Comments)    BECOMES VIOLENT  . Chlorpromazine Other (See Comments)    SEVERE ANXIETY   . Gabapentin Other (See Comments)    NIGHTMARES     Patient Measurements: Height: 5\' 10"  (177.8 cm) Weight: 280 lb (127 kg) IBW/kg (Calculated) : 68.5 Heparin Dosing Weight: 98kg  Vital Signs: Temp: 97.2 F (36.2 C) (10/12 0906) Temp Source: Skin (10/12 0906) BP: 148/78 (10/12 1440) Pulse Rate: 70 (10/12 1440)  Labs: Recent Labs    08/16/19 0949  HGB 13.3  HCT 39.0  CREATININE 1.30*    Estimated Creatinine Clearance: 72.6 mL/min (A) (by C-G formula based on SCr of 1.3 mg/dL (H)).   Medical History: Past Medical History:  Diagnosis Date  . Anxiety   . Bipolar disorder ([place])   . Chronic kidney disease    Stage 3 kidney disease;dx by Dr. Sinda Du.   . Constipation   . Depression   . Diabetes mellitus without complication ([place])   . Dyspnea    with exertion  . GERD (gastroesophageal reflux disease)   . Headache    migraines  . High cholesterol   . History of DVT (deep vein thrombosis)    left leg  . Hypertension    states under control with meds., has been on med. x 2 years  . Hypothyroidism   . Neuropathy   . Obsessive-compulsive disorder   . Peripheral vascular disease ([place])   . Restless leg syndrome   . Trigger thumb of left hand 01/2018  . Trigger thumb of right hand     Assessment: 39 yoF with LLE ischemia s/p angioplasty and stenting in vascular lab this afternoon. Pharmacy asked to start heparin 2hr after sheath pulled (~1330). Pt on Xarelto PTA, discrepancy about timing of last dose so will run heparin infusion tonight and start Xarelto tomorrow morning.  Goal of Therapy:  Heparin level 0.3-0.7 units/ml  APTT 66-102  seconds Monitor platelets by anticoagulation protocol: Yes   Plan:  -Heparin 1000 units/hr starting 1530 no bolus until 10/13 at 1000 -Check 6hr aPTT -Restart Xarelto tomorrow morning per Dr Noralee Chars, PharmD, BCPS Clinical Pharmacist [phone] Please check AMION for all [facility] numbers 08/16/2019

## 2019-08-16 NOTE — Progress Notes (Signed)
[facility] for Heparin Indication: VTE, s/p OR  Allergies  Allergen Reactions  . Aripiprazole Other (See Comments)    BECOMES  VIOLENT   . Seroquel [Quetiapine Fumarate] Other (See Comments)    BECOMES VIOLENT  . Chlorpromazine Other (See Comments)    SEVERE ANXIETY   . Gabapentin Other (See Comments)    NIGHTMARES     Patient Measurements: Height: 5\' 10"  (177.8 cm) Weight: 280 lb (127 kg) IBW/kg (Calculated) : 68.5 Heparin Dosing Weight: 98kg  Vital Signs: Temp: 97.6 F (36.4 C) (10/12 2100) Temp Source: Oral (10/12 2100) BP: 141/63 (10/12 2100) Pulse Rate: 67 (10/12 2100)  Labs: Recent Labs    08/16/19 0949 08/16/19 2125  HGB 13.3  --   HCT 39.0  --   APTT  --  38*  CREATININE 1.30*  --     Estimated Creatinine Clearance: 72.6 mL/min (A) (by C-G formula based on SCr of 1.3 mg/dL (H)).   Medical History: Past Medical History:  Diagnosis Date  . Anxiety   . Bipolar disorder ([place])   . Chronic kidney disease    Stage 3 kidney disease;dx by Dr. Sinda Du.   . Constipation   . Depression   . Diabetes mellitus without complication ([place])   . Dyspnea    with exertion  . GERD (gastroesophageal reflux disease)   . Headache    migraines  . High cholesterol   . History of DVT (deep vein thrombosis)    left leg  . Hypertension    states under control with meds., has been on med. x 2 years  . Hypothyroidism   . Neuropathy   . Obsessive-compulsive disorder   . Peripheral vascular disease ([place])   . Restless leg syndrome   . Trigger thumb of left hand 01/2018  . Trigger thumb of right hand     Assessment: 73 yoF with LLE ischemia s/p angioplasty and stenting in vascular lab this afternoon. Pharmacy asked to start heparin 2hr after sheath pulled (~1330). Pt on Xarelto PTA, discrepancy about timing of last dose so will run heparin infusion tonight and start Xarelto tomorrow morning.  10/12 PM update: initial aPTT  low, no issues per RN  Goal of Therapy:  Heparin level 0.3-0.7 units/ml  APTT 66-102 seconds Monitor platelets by anticoagulation protocol: Yes   Plan:  -Inc heparin to 1250 units/hr -8 hour aPTT/heparin level -Restart Xarelto tomorrow morning per Dr Silvestre Gunner, PharmD, BCPS Clinical Pharmacist Phone: [phone]

## 2019-08-17 ENCOUNTER — Encounter (HOSPITAL_COMMUNITY): Payer: Self-pay | Admitting: Vascular Surgery

## 2019-08-17 DIAGNOSIS — I82402 Acute embolism and thrombosis of unspecified deep veins of left lower extremity: Secondary | ICD-10-CM | POA: Diagnosis present

## 2019-08-17 DIAGNOSIS — T82856A Stenosis of peripheral vascular stent, initial encounter: Secondary | ICD-10-CM | POA: Diagnosis present

## 2019-08-17 LAB — BASIC METABOLIC PANEL
Anion gap: 12 (ref 5–15)
BUN: 22 mg/dL — ABNORMAL HIGH (ref 6–20)
CO2: 28 mmol/L (ref 22–32)
Calcium: 9.1 mg/dL (ref 8.9–10.3)
Chloride: 100 mmol/L (ref 98–111)
Creatinine, Ser: 1.15 mg/dL — ABNORMAL HIGH (ref 0.44–1.00)
GFR calc Af Amer: >60 mL/min (ref >60)
GFR calc non Af Amer: 54 mL/min — ABNORMAL LOW (ref >60)
Glucose, Bld: 158 mg/dL — ABNORMAL HIGH (ref 70–99)
Potassium: 3.3 mmol/L — ABNORMAL LOW (ref 3.5–5.1)
Sodium: 140 mmol/L (ref 135–145)

## 2019-08-17 LAB — CBC
HCT: 36 % (ref 36.0–46.0)
Hemoglobin: 11.2 g/dL — ABNORMAL LOW (ref 12.0–15.0)
MCH: 25.9 pg — ABNORMAL LOW (ref 26.0–34.0)
MCHC: 31.1 g/dL (ref 30.0–36.0)
MCV: 83.1 fL (ref 80.0–100.0)
Platelets: 128 10*3/uL — ABNORMAL LOW (ref 150–400)
RBC: 4.33 MIL/uL (ref 3.87–5.11)
RDW: 21.2 % — ABNORMAL HIGH (ref 11.5–15.5)
WBC: 5.7 10*3/uL (ref 4.0–10.5)
nRBC: 0 % (ref 0.0–0.2)

## 2019-08-17 LAB — PROTIME-INR
INR: 0.9 (ref 0.8–1.2)
Prothrombin Time: 12.5 seconds (ref 11.4–15.2)

## 2019-08-17 LAB — APTT
aPTT: 30 seconds (ref 24–36)
aPTT: 43 seconds — ABNORMAL HIGH (ref 24–36)

## 2019-08-17 LAB — GLUCOSE, CAPILLARY
Glucose-Capillary: 108 mg/dL — ABNORMAL HIGH (ref 70–99)
Glucose-Capillary: 174 mg/dL — ABNORMAL HIGH (ref 70–99)
Glucose-Capillary: 97 mg/dL (ref 70–99)
Glucose-Capillary: 104 mg/dL — ABNORMAL HIGH (ref 70–99)

## 2019-08-17 LAB — HEPARIN LEVEL (UNFRACTIONATED): Heparin Unfractionated: 0.63 IU/mL (ref 0.30–0.70)

## 2019-08-17 MED ORDER — WARFARIN - PHARMACIST DOSING INPATIENT: Freq: Every day | Status: DC

## 2019-08-17 MED ORDER — HEPARIN (PORCINE) 25000 UT/250ML-% IV SOLN
2100.0000 [IU]/h | INTRAVENOUS | Status: AC
  Administered 2019-08-17: 1550 [IU]/h via INTRAVENOUS
  Administered 2019-08-18: 03:00:00 2100 [IU]/h via INTRAVENOUS
  Filled 2019-08-17 (×2): qty 250

## 2019-08-17 MED ORDER — WARFARIN SODIUM 10 MG PO TABS
10.0000 mg | ORAL_TABLET | Freq: Once | ORAL | Status: AC
  Administered 2019-08-17: 10 mg via ORAL
  Filled 2019-08-17: qty 1

## 2019-08-17 NOTE — Progress Notes (Addendum)
  Progress Note    08/17/2019 8:15 AM 1 Day Post-Op  Subjective:  Feels better  afebrile  Vitals:   08/16/19 2100 08/17/19 0607  BP: (!) 141/63 (!) 148/81  Pulse: 67 71  Resp: 20 20  Temp: 97.6 F (36.4 C) 97.6 F (36.4 C)  SpO2: 96% 96%    Physical Exam: General:  No distress Lungs:  Non labored Incisions:  SSV left-clean and dry Extremities:  Compression sock in place LLE   CBC    Component Value Date/Time   WBC 5.7 08/17/2019 0319   RBC 4.33 08/17/2019 0319   HGB 11.2 (L) 08/17/2019 0319   HCT 36.0 08/17/2019 0319   PLT 128 (L) 08/17/2019 0319   MCV 83.1 08/17/2019 0319   MCH 25.9 (L) 08/17/2019 0319   MCHC 31.1 08/17/2019 0319   RDW 21.2 (H) 08/17/2019 0319    BMET    Component Value Date/Time   NA 140 08/17/2019 0319   K 3.3 (L) 08/17/2019 0319   CL 100 08/17/2019 0319   CO2 28 08/17/2019 0319   GLUCOSE 158 (H) 08/17/2019 0319   BUN 22 (H) 08/17/2019 0319   CREATININE 1.15 (H) 08/17/2019 0319   CALCIUM 9.1 08/17/2019 0319   GFRNONAA 54 (L) 08/17/2019 0319   GFRAA >60 08/17/2019 0319    INR    Component Value Date/Time   INR 1.8 (H) 04/27/2019 0954     Intake/Output Summary (Last 24 hours) at 08/17/2019 0815 Last data filed at 08/17/2019 0300 Gross per 24 hour  Intake 600.04 ml  Output -  Net 600.04 ml     Assessment:  53 y.o. female is s/p:  1.  Ultrasound-guided cannulation left small saphenous vein 2.  Left lower extremity and central venography 3.  Intravascular ultrasound IVC, left common external iliac vein stents, common femoral vein and femoral vein on the left 4.  Balloon angioplasty of left common external iliac vein stent stenosis 5.  Stent of left common femoral and femoral vein with 12 x 90 mm Vici 6.  Moderate sedation with fentanyl Versed for 51 minutes 1 Day Post-Op  Plan: -pt doing well this am and sx improved. -continue heparin for now -continue compression sock. -left SSV stick site is clean -creatinine  down this am -Dr. Donzetta Matters to see later this am with plan   Leontine Locket, PA-C [facility] [phone] 08/17/2019 8:15 AM  I have independently interviewed and examined patient and agree with PA assessment and plan above.  Given that we have had multiple complications on Xarelto will plan to transition to Coumadin and she can likely bridge with Lovenox.  Compression sock in place and edema is improving.  Eshika Reckart C. Donzetta Matters, MD [facility] [place] Office: [phone] Pager: [phone]

## 2019-08-17 NOTE — Discharge Instructions (Addendum)

## 2019-08-17 NOTE — Progress Notes (Signed)
[facility] for Heparin Indication: VTE, s/p OR  Allergies  Allergen Reactions  . Aripiprazole Other (See Comments)    BECOMES  VIOLENT   . Seroquel [Quetiapine Fumarate] Other (See Comments)    BECOMES VIOLENT  . Chlorpromazine Other (See Comments)    SEVERE ANXIETY   . Gabapentin Other (See Comments)    NIGHTMARES     Patient Measurements: Height: 5\' 10"  (177.8 cm) Weight: 284 lb 1.6 oz (128.9 kg) IBW/kg (Calculated) : 68.5 Heparin Dosing Weight: 98kg  Vital Signs: Temp: 97.6 F (36.4 C) (10/13 0607) Temp Source: Oral (10/13 0607) BP: 148/81 (10/13 0607) Pulse Rate: 71 (10/13 0607)  Labs: Recent Labs    08/16/19 0949 08/16/19 2125 08/17/19 0319 08/17/19 0747  HGB 13.3  --  11.2*  --   HCT 39.0  --  36.0  --   PLT  --   --  128*  --   APTT  --  38*  --  30  HEPARINUNFRC  --   --   --  0.63  CREATININE 1.30*  --  1.15*  --     Estimated Creatinine Clearance: 82.8 mL/min (A) (by C-G formula based on SCr of 1.15 mg/dL (H)).   Medical History: Past Medical History:  Diagnosis Date  . Anxiety   . Bipolar disorder ([place])   . Chronic kidney disease    Stage 3 kidney disease;dx by Dr. Sinda Du.   . Constipation   . Depression   . Diabetes mellitus without complication ([place])   . Dyspnea    with exertion  . GERD (gastroesophageal reflux disease)   . Headache    migraines  . High cholesterol   . History of DVT (deep vein thrombosis)    left leg  . Hypertension    states under control with meds., has been on med. x 2 years  . Hypothyroidism   . Neuropathy   . Obsessive-compulsive disorder   . Peripheral vascular disease ([place])   . Restless leg syndrome   . Trigger thumb of left hand 01/2018  . Trigger thumb of right hand     Assessment: 35 yoF with LLE ischemia s/p angioplasty and stenting in vascular lab on 10/12. She is on xarelto PTA for history of VTE.  Plans to change to coumadin to to multiple  complications on xarelto -aPTT= 30    Goal of Therapy:  Heparin level 0.3-0.7 units/ml  APTT 66-102 seconds INR goal 2-3 Monitor platelets by anticoagulation protocol: Yes   Plan:  -Increase heparin to 1550 units/hr -aPTT in 6 hours with INR -Coumadin 10mg  po z1 -Daily heparin level, CBC and aPTT -Daily INR  Hildred Laser, PharmD Clinical Pharmacist **Pharmacist phone directory can now be found on [email or web address] ([email or web address]).  Listed under [place].

## 2019-08-17 NOTE — Progress Notes (Signed)
[facility] for Heparin>>warfarin Indication: VTE, s/p OR  Allergies  Allergen Reactions  . Aripiprazole Other (See Comments)    BECOMES  VIOLENT   . Seroquel [Quetiapine Fumarate] Other (See Comments)    BECOMES VIOLENT  . Chlorpromazine Other (See Comments)    SEVERE ANXIETY   . Gabapentin Other (See Comments)    NIGHTMARES     Patient Measurements: Height: 5\' 10"  (177.8 cm) Weight: 284 lb 1.6 oz (128.9 kg) IBW/kg (Calculated) : 68.5 Heparin Dosing Weight: 98kg  Vital Signs: Temp: 98.7 F (37.1 C) (10/13 1446) Temp Source: Oral (10/13 1446) BP: 144/77 (10/13 1446) Pulse Rate: 75 (10/13 1446)  Labs: Recent Labs    08/16/19 0949 08/16/19 2125 08/17/19 0319 08/17/19 0747 08/17/19 1638  HGB 13.3  --  11.2*  --   --   HCT 39.0  --  36.0  --   --   PLT  --   --  128*  --   --   APTT  --  38*  --  30 43*  LABPROT  --   --   --   --  12.5  INR  --   --   --   --  0.9  HEPARINUNFRC  --   --   --  0.63  --   CREATININE 1.30*  --  1.15*  --   --     Estimated Creatinine Clearance: 82.8 mL/min (A) (by C-G formula based on SCr of 1.15 mg/dL (H)).   Medical History: Past Medical History:  Diagnosis Date  . Anxiety   . Bipolar disorder ([place])   . Chronic kidney disease    Stage 3 kidney disease;dx by Dr. Sinda Du.   . Constipation   . Depression   . Diabetes mellitus without complication ([place])   . Dyspnea    with exertion  . GERD (gastroesophageal reflux disease)   . Headache    migraines  . High cholesterol   . History of DVT (deep vein thrombosis)    left leg  . Hypertension    states under control with meds., has been on med. x 2 years  . Hypothyroidism   . Neuropathy   . Obsessive-compulsive disorder   . Peripheral vascular disease ([place])   . Restless leg syndrome   . Trigger thumb of left hand 01/2018  . Trigger thumb of right hand     Assessment: 9 yoF with LLE ischemia s/p angioplasty and stenting  in vascular lab on 10/12. She is on xarelto PTA for history of VTE.  Plans to change to coumadin to to multiple complications on xarelto.  Follow up aptt tonight is still low at 43s, INR was normal. Loading dose of warfarin given. No bleeding issues noted.   Goal of Therapy:  Heparin level 0.3-0.7 units/ml  APTT 66-102 seconds INR goal 2-3 Monitor platelets by anticoagulation protocol: Yes   Plan:  -Increase heparin to 1900 units/hr -aPTT in 6 hours -Daily heparin level, CBC, INR, and aPTT -Daily INR  Erin Hearing PharmD., BCPS Clinical Pharmacist 08/17/2019 6:05 PM

## 2019-08-18 ENCOUNTER — Ambulatory Visit (HOSPITAL_COMMUNITY): Payer: Medicaid Other | Admitting: Psychiatry

## 2019-08-18 ENCOUNTER — Telehealth: Payer: Self-pay | Admitting: *Deleted

## 2019-08-18 LAB — GLUCOSE, CAPILLARY
Glucose-Capillary: 120 mg/dL — ABNORMAL HIGH (ref 70–99)
Glucose-Capillary: 132 mg/dL — ABNORMAL HIGH (ref 70–99)

## 2019-08-18 LAB — BASIC METABOLIC PANEL
Anion gap: 12 (ref 5–15)
BUN: 20 mg/dL (ref 6–20)
CO2: 25 mmol/L (ref 22–32)
Calcium: 9.3 mg/dL (ref 8.9–10.3)
Chloride: 101 mmol/L (ref 98–111)
Creatinine, Ser: 0.97 mg/dL (ref 0.44–1.00)
GFR calc Af Amer: >60 mL/min (ref >60)
GFR calc non Af Amer: >60 mL/min (ref >60)
Glucose, Bld: 131 mg/dL — ABNORMAL HIGH (ref 70–99)
Potassium: 3.7 mmol/L (ref 3.5–5.1)
Sodium: 138 mmol/L (ref 135–145)

## 2019-08-18 LAB — PROTIME-INR
INR: 0.9 (ref 0.8–1.2)
Prothrombin Time: 12.5 seconds (ref 11.4–15.2)

## 2019-08-18 LAB — CBC
HCT: 36.2 % (ref 36.0–46.0)
Hemoglobin: 11.3 g/dL — ABNORMAL LOW (ref 12.0–15.0)
MCH: 25.8 pg — ABNORMAL LOW (ref 26.0–34.0)
MCHC: 31.2 g/dL (ref 30.0–36.0)
MCV: 82.6 fL (ref 80.0–100.0)
Platelets: 136 10*3/uL — ABNORMAL LOW (ref 150–400)
RBC: 4.38 MIL/uL (ref 3.87–5.11)
RDW: 21 % — ABNORMAL HIGH (ref 11.5–15.5)
WBC: 5.6 10*3/uL (ref 4.0–10.5)
nRBC: 0 % (ref 0.0–0.2)

## 2019-08-18 LAB — HEPARIN LEVEL (UNFRACTIONATED): Heparin Unfractionated: 0.47 IU/mL (ref 0.30–0.70)

## 2019-08-18 LAB — APTT: aPTT: 60 seconds — ABNORMAL HIGH (ref 24–36)

## 2019-08-18 MED ORDER — ENOXAPARIN SODIUM 120 MG/0.8ML ~~LOC~~ SOLN
120.0000 mg | Freq: Two times a day (BID) | SUBCUTANEOUS | Status: DC
  Filled 2019-08-18: qty 0.8

## 2019-08-18 MED ORDER — ENOXAPARIN SODIUM 120 MG/0.8ML ~~LOC~~ SOLN: 120.0000 mg | Freq: Two times a day (BID) | SUBCUTANEOUS | 0 refills | Status: DC

## 2019-08-18 MED ORDER — WARFARIN SODIUM 5 MG PO TABS: 5.0000 mg | ORAL_TABLET | Freq: Every day | ORAL | 6 refills | Status: DC

## 2019-08-18 MED ORDER — ENOXAPARIN SODIUM 120 MG/0.8ML ~~LOC~~ SOLN
120.0000 mg | Freq: Two times a day (BID) | SUBCUTANEOUS | Status: DC
  Filled 2019-08-18 (×2): qty 0.8

## 2019-08-18 MED ORDER — WARFARIN SODIUM 5 MG PO TABS: 5.0000 mg | ORAL_TABLET | Freq: Once | ORAL | Status: DC

## 2019-08-18 MED FILL — ENOXAPARIN SODIUM 120 MG/0.: 120 | 7 days supply | Qty: 11 | Fill #0

## 2019-08-18 MED FILL — WARFARIN SODIUM 5 MG TABLET: 5 | 30 days supply | Qty: 30 | Fill #0

## 2019-08-18 NOTE — Telephone Encounter (Signed)
I called Dr. Sinda Du' office and [facility] regarding following this patient's Coumadin and INRs.

## 2019-08-18 NOTE — Discharge Summary (Signed)
Physician Discharge Summary   Patient ID: Jane [name] [id number] 53 y.o. [birth year]  Admit date: 08/16/2019  Discharge date and time: 08/18/2019  2:32 PM   Admitting Physician: Waynetta Sandy, MD   Discharge Physician: same  Admission Diagnoses: DVT (deep venous thrombosis) ([place]) [I82.409]  Discharge Diagnoses: same  Admission Condition: fair  Discharged Condition: fair  Indication for Admission: recurrent LLE DVT  Hospital Course: Jane [name] is a 53 year old female with a recurrent left lower extremity DVT who came in as an outpatient for balloon angioplasty of left common and external iliac vein stent stenosis and stenting of left common femoral and femoral vein by Dr. Donzetta Matters on 08/16/2019.  Because this is a recurrent DVT she was switched from Xarelto to Coumadin.  She was bridged with Lovenox.  She will be discharged with 5 mg of Coumadin daily and 7 more days of Lovenox bridge.  We will plug her into a her PCP or Coumadin clinic for monitoring of INR.  At the time of discharge her INR is 1.  She will follow-up in office in about 4 weeks with a IVC/iliac venous duplex.  If PCP and Coumadin clinic follows through we will have to check her INR ourselves in 1 week and continue to work on means to monitor her INR.  Discharge instructions were reviewed with patient and she voiced her understanding.  She was discharged home in stable condition.   Consults: None  Treatments: surgery: Balloon angioplasty of left common and external iliac vein stent stenosis and stent of left common femoral and femoral vein by Dr. Donzetta Matters on 08/16/2019  Discharge Exam: See progress note 08/18/2019 Vitals:   08/17/19 2040 08/18/19 0530  BP: (!) 159/65 140/60  Pulse: 73 80  Resp: 20 18  Temp: 97.7 F (36.5 C) 98 F (36.7 C)  SpO2: 96% 98%    Disposition: Discharge disposition: 01-Home or Self Care       Patient Instructions:  Allergies as of 08/18/2019      Reactions   Aripiprazole Other (See Comments)   BECOMES  VIOLENT   Seroquel [quetiapine Fumarate] Other (See Comments)   BECOMES VIOLENT   Chlorpromazine Other (See Comments)   SEVERE ANXIETY   Gabapentin Other (See Comments)   NIGHTMARES      Medication List    STOP taking these medications   HYDROcodone-acetaminophen 10-325 MG tablet Commonly known as: NORCO   rivaroxaban 20 MG Tabs tablet Commonly known as: Xarelto     TAKE these medications   ALPRAZolam 1 MG tablet Commonly known as: Xanax Take 1 tablet (1 mg total) by mouth 2 (two) times daily as needed for anxiety.   amphetamine-dextroamphetamine 30 MG tablet Commonly known as: Adderall Take 1 tablet by mouth 2 (two) times daily. What changed: Another medication with the same name was removed. Continue taking this medication, and follow the directions you see here.   aspirin EC 81 MG tablet Take 81 mg by mouth daily.   atorvastatin 20 MG tablet Commonly known as: LIPITOR Take 20 mg by mouth daily.   cetirizine 10 MG tablet Commonly known as: ZYRTEC Take 10 mg by mouth daily.   cycloSPORINE 0.05 % ophthalmic emulsion Commonly known as: RESTASIS Place 1 drop into both eyes 2 (two) times daily.   DULoxetine 60 MG capsule Commonly known as: CYMBALTA Take 1 capsule (60 mg total) by mouth 2 (two) times daily.   enoxaparin 120 MG/0.8ML injection Commonly known as: LOVENOX Inject 0.8 mLs (120 mg total)  into the skin every 12 (twelve) hours for 7 days.   Fish Oil 1000 MG Caps Take 1,000 mg by mouth daily.   furosemide 40 MG tablet Commonly known as: LASIX Take 40 mg by mouth as needed for fluid.   glipiZIDE 5 MG 24 hr tablet Commonly known as: GLUCOTROL XL Take 5 mg by mouth daily with breakfast.   hydrochlorothiazide 25 MG tablet Commonly known as: HYDRODIURIL Take 25 mg by mouth daily.   levothyroxine 150 MCG tablet Commonly known as: SYNTHROID Take 150 mcg by mouth daily before breakfast.   linaclotide 145  MCG Caps capsule Commonly known as: LINZESS Take 145 mcg by mouth daily.   Magnesium 250 MG Tabs Take 250 mg by mouth daily.   Melatonin 10 MG Tbdp Take 10 mg by mouth at bedtime as needed (for sleep).   multivitamin with minerals Tabs tablet Take 1 tablet by mouth daily. One-A-Day Women's Multivitamin   pantoprazole 40 MG tablet Commonly known as: PROTONIX Take 40 mg by mouth daily.   P[place]o 0.7 % Soln Generic drug: Olopatadine HCl Place 1 drop into both eyes daily.   potassium chloride 10 MEQ tablet Commonly known as: KLOR-CON Take 10 mEq by mouth 3 (three) times daily.   pregabalin 100 MG capsule Commonly known as: LYRICA Take 100 mg by mouth 3 (three) times daily.   REFRESH OP Place 1 drop into both eyes 3 (three) times daily.   rOPINIRole 2 MG tablet Commonly known as: REQUIP Take 2 mg by mouth 2 (two) times daily.   triamcinolone 0.1 % paste Commonly known as: KENALOG Use as directed 1 application in the mouth or throat 4 (four) times daily as needed (mouth ulcers).   vitamin C 500 MG tablet Commonly known as: ASCORBIC ACID Take 500 mg by mouth at bedtime.   Vitamin D3 125 MCG (5000 UT) Caps Take 5,000 Units by mouth at bedtime.   warfarin 5 MG tablet Commonly known as: COUMADIN Take 1 tablet (5 mg total) by mouth daily at 6 PM.      Activity: activity as tolerated Diet: regular diet Wound Care: keep wound clean and dry  Follow-up with Dr. Donzetta Matters in 4 weeks.  SignedDagoberto Ligas 08/18/2019 3:25 PM

## 2019-08-18 NOTE — Progress Notes (Signed)
[place] for Heparin Indication: VTE  Allergies  Allergen Reactions  . Aripiprazole Other (See Comments)    BECOMES  VIOLENT   . Seroquel [Quetiapine Fumarate] Other (See Comments)    BECOMES VIOLENT  . Chlorpromazine Other (See Comments)    SEVERE ANXIETY   . Gabapentin Other (See Comments)    NIGHTMARES     Patient Measurements: Height: 5\' 10"  (177.8 cm) Weight: 284 lb 1.6 oz (128.9 kg) IBW/kg (Calculated) : 68.5 Heparin Dosing Weight: 98kg  Vital Signs: Temp: 97.7 F (36.5 C) (10/13 2040) Temp Source: Oral (10/13 2040) BP: 159/65 (10/13 2040) Pulse Rate: 73 (10/13 2040)  Labs: Recent Labs    08/16/19 0949  08/17/19 0319 08/17/19 0747 08/17/19 1638 08/18/19 0229  HGB 13.3  --  11.2*  --   --  11.3*  HCT 39.0  --  36.0  --   --  36.2  PLT  --   --  128*  --   --  136*  APTT  --    < >  --  30 43* 60*  LABPROT  --   --   --   --  12.5  --   INR  --   --   --   --  0.9  --   HEPARINUNFRC  --   --   --  0.63  --  0.47  CREATININE 1.30*  --  1.15*  --   --   --    < > = values in this interval not displayed.    Estimated Creatinine Clearance: 82.8 mL/min (A) (by C-G formula based on SCr of 1.15 mg/dL (H)).  Assessment: 53 y.o. female with h/o DVT for heparin  Goal of Therapy:  Heparin level 0.3-0.7 units/ml  APTT 66-102 seconds INR goal 2-3 Monitor platelets by anticoagulation protocol: Yes   Plan:  Increase heparin to 2100 units/hr  Phillis Knack, PharmD, BCPS  08/18/2019 3:02 AM

## 2019-08-18 NOTE — Progress Notes (Signed)
[facility] for Heparin>> lovenox Indication: VTE  Allergies  Allergen Reactions  . Aripiprazole Other (See Comments)    BECOMES  VIOLENT   . Seroquel [Quetiapine Fumarate] Other (See Comments)    BECOMES VIOLENT  . Chlorpromazine Other (See Comments)    SEVERE ANXIETY   . Gabapentin Other (See Comments)    NIGHTMARES     Patient Measurements: Height: 5\' 10"  (177.8 cm) Weight: 281 lb 8 oz (127.7 kg) IBW/kg (Calculated) : 68.5 Heparin Dosing Weight: 98kg  Vital Signs: Temp: 98 F (36.7 C) (10/14 0530) Temp Source: Oral (10/14 0530) BP: 140/60 (10/14 0530) Pulse Rate: 80 (10/14 0530)  Labs: Recent Labs    08/16/19 0949  08/17/19 0319 08/17/19 0747 08/17/19 1638 08/18/19 0229  HGB 13.3  --  11.2*  --   --  11.3*  HCT 39.0  --  36.0  --   --  36.2  PLT  --   --  128*  --   --  136*  APTT  --    < >  --  30 43* 60*  LABPROT  --   --   --   --  12.5 12.5  INR  --   --   --   --  0.9 0.9  HEPARINUNFRC  --   --   --  0.63  --  0.47  CREATININE 1.30*  --  1.15*  --   --  0.97   < > = values in this interval not displayed.    Estimated Creatinine Clearance: 97.6 mL/min (by C-G formula based on SCr of 0.97 mg/dL).  Assessment: 57 yoF with LLE ischemia s/p angioplasty and stenting in vascular lab on 10/12. She is on xarelto PTA for history of VTE.  Plans to change to coumadin to to multiple complications on xarelto. She is on heparin and to change to lovenox -hg= 11.3, INR= 0.9  Goal of Therapy:  INR goal 2-3 Monitor platelets by anticoagulation protocol: Yes   Plan:  -lovenox 120mg  sq q12h -Coumadin 5mg  po today (would discharge on 5mg  po daily) -Daily INR while admitted  Hildred Laser, PharmD Clinical Pharmacist **Pharmacist phone directory can now be found on [email or web address] ([email or web address]).  Listed under [place].

## 2019-08-18 NOTE — Telephone Encounter (Signed)
Dr. [name]' nurse called back and they will contact the patient for INRs and Coumadin management.

## 2019-08-18 NOTE — Progress Notes (Signed)
  Progress Note    08/18/2019 10:38 AM 2 Days Post-Op  Subjective: left leg feeling better  Vitals:   08/17/19 2040 08/18/19 0530  BP: (!) 159/65 140/60  Pulse: 73 80  Resp: 20 18  Temp: 97.7 F (36.5 C) 98 F (36.7 C)  SpO2: 96% 98%    Physical Exam: [email or web address] Left leg edema significantly improved   CBC    Component Value Date/Time   WBC 5.6 08/18/2019 0229   RBC 4.38 08/18/2019 0229   HGB 11.3 (L) 08/18/2019 0229   HCT 36.2 08/18/2019 0229   PLT 136 (L) 08/18/2019 0229   MCV 82.6 08/18/2019 0229   MCH 25.8 (L) 08/18/2019 0229   MCHC 31.2 08/18/2019 0229   RDW 21.0 (H) 08/18/2019 0229    BMET    Component Value Date/Time   NA 138 08/18/2019 0229   K 3.7 08/18/2019 0229   CL 101 08/18/2019 0229   CO2 25 08/18/2019 0229   GLUCOSE 131 (H) 08/18/2019 0229   BUN 20 08/18/2019 0229   CREATININE 0.97 08/18/2019 0229   CALCIUM 9.3 08/18/2019 0229   GFRNONAA >60 08/18/2019 0229   GFRAA >60 08/18/2019 0229    INR    Component Value Date/Time   INR 0.9 08/18/2019 0229     Intake/Output Summary (Last 24 hours) at 08/18/2019 1038 Last data filed at 08/17/2019 2145 Gross per 24 hour  Intake 360 ml  Output -  Net 360 ml     Assessment/plan:  53 y.o. female is s/p left lower extremity stenting for occluded femoral vein stent. Will transition to coumadin and plan discharge on lovenox. F/u in a few weeks with ivc/iliac vein duplex.     Brandon C. Donzetta Matters, MD [facility] Office: [phone] Pager: [phone]  08/18/2019 10:38 AM

## 2019-08-18 NOTE — Telephone Encounter (Signed)
-----   Message from Dagoberto Ligas, PA-C sent at 08/18/2019 11:02 AM EDT -----  Can you schedule a ivc/iliac venous duplex for this pt with Dr. Donzetta Matters in about 4 weeks.  This patient will also need someone to follow her INR now that we have switched her to coumadin.  Can you call her PCP to see if they can monitor her INR.  She is being discharged on 5mg  coumadin daily with 3 more days of lovenox bridge.  Her INR at time of discharge is 1, so she will need her INR checked in about 7-10 days.  If there are any questions, let me know. Thanks, [facility]

## 2019-08-19 ENCOUNTER — Ambulatory Visit (HOSPITAL_COMMUNITY): Payer: Medicaid Other | Admitting: Hematology

## 2019-08-26 ENCOUNTER — Other Ambulatory Visit: Payer: Self-pay

## 2019-08-26 ENCOUNTER — Ambulatory Visit (INDEPENDENT_AMBULATORY_CARE_PROVIDER_SITE_OTHER): Payer: Medicaid Other | Admitting: Otolaryngology

## 2019-08-26 DIAGNOSIS — K120 Recurrent oral aphthae: Secondary | ICD-10-CM | POA: Diagnosis not present

## 2019-08-27 ENCOUNTER — Other Ambulatory Visit (HOSPITAL_COMMUNITY)
Admission: RE | Admit: 2019-08-27 | Discharge: 2019-08-27 | Disposition: A | Discharge status: Home / Self Care | Payer: Medicaid Other | Source: Ambulatory Visit | Attending: Pulmonary Disease | Admitting: Pulmonary Disease

## 2019-08-27 ENCOUNTER — Other Ambulatory Visit: Payer: Self-pay | Admitting: Vascular Surgery

## 2019-08-27 DIAGNOSIS — I871 Compression of vein: Secondary | ICD-10-CM

## 2019-08-27 DIAGNOSIS — Z7901 Long term (current) use of anticoagulants: Secondary | ICD-10-CM | POA: Insufficient documentation

## 2019-08-27 DIAGNOSIS — I82409 Acute embolism and thrombosis of unspecified deep veins of unspecified lower extremity: Secondary | ICD-10-CM | POA: Diagnosis not present

## 2019-08-27 LAB — PROTIME-INR
INR: 1.9 — ABNORMAL HIGH (ref 0.8–1.2)
Prothrombin Time: 21.7 seconds — ABNORMAL HIGH (ref 11.4–15.2)

## 2019-08-30 ENCOUNTER — Encounter: Payer: Self-pay | Admitting: Orthopedic Surgery

## 2019-08-30 ENCOUNTER — Ambulatory Visit: Payer: Medicaid Other | Admitting: Orthopedic Surgery

## 2019-08-30 ENCOUNTER — Other Ambulatory Visit: Payer: Self-pay

## 2019-08-30 VITALS — BP 145/81 | HR 74 | Temp 97.4°F | Ht 70.0 in | Wt 277.0 lb

## 2019-08-30 DIAGNOSIS — M25562 Pain in left knee: Secondary | ICD-10-CM | POA: Diagnosis not present

## 2019-08-30 NOTE — Progress Notes (Signed)
Jane [name]  08/30/2019  HISTORY SECTION :  Chief Complaint  Patient presents with  . Knee Pain    Left knee pain.    She is 53 she had knee pain while she had an arterial thrombus recently.  Once the thrombus was cleaned out her knee pain basically stopped.  She was sent here for an evaluation for knee pain with a fairly normal-looking x-ray in terms of arthritis she is currently asymptomatic   Review of Systems  Cardiovascular: Positive for leg swelling and PND.  Skin:       Left leg ulcer     has a past medical history of Anxiety, Bipolar disorder ([place]), Chronic kidney disease, Constipation, Depression, Diabetes mellitus without complication ([place]), Dyspnea, GERD (gastroesophageal reflux disease), Headache, High cholesterol, History of DVT (deep vein thrombosis), Hypertension, Hypothyroidism, Neuropathy, Obsessive-compulsive disorder, Peripheral vascular disease ([place]), Restless leg syndrome, Trigger thumb of left hand (01/2018), and Trigger thumb of right hand.   Past Surgical History:  Procedure Laterality Date  . ABDOMINAL HYSTERECTOMY  06/2016   complete  . APPLICATION OF WOUND VAC Left 04/27/2019   Procedure: APPLICATION OF WOUND VAC LEFT GROIN;  Surgeon: Waynetta Sandy, MD;  Location: [place];  Service: Vascular;  Laterality: Left;  . AV FISTULA PLACEMENT Left 11/24/2018   Procedure: ARTERIOVENOUS (AV) FISTULA CREATION LEFT SFA TO LEFT FEMORAL VEIN;  Surgeon: Waynetta Sandy, MD;  Location: [place];  Service: Vascular;  Laterality: Left;  . CHOLECYSTECTOMY    . FEMORAL ARTERY EXPLORATION  03/30/2019   Procedure: Left Common Femoral Artery and Vein Exploration;  Surgeon: Waynetta Sandy, MD;  Location: [place];  Service: Vascular;;  . FEMORAL-FEMORAL BYPASS GRAFT Left 11/24/2018   Procedure: BYPASS GRAFT FEMORAL-FEMORAL VENOUS LEFT TO RIGHT PALMA PROCEDURE USING CRYOVEIN;  Surgeon: Waynetta Sandy, MD;  Location: [place];  Service: Vascular;   Laterality: Left;  . GROIN DEBRIDEMENT Left 04/27/2019   Procedure: GROIN DEBRIDEMENT;  Surgeon: Waynetta Sandy, MD;  Location: [place];  Service: Vascular;  Laterality: Left;  . INSERTION OF ILIAC STENT  03/30/2019   Procedure: Stent of left common, external iliac veins and left common femoral vein;  Surgeon: Waynetta Sandy, MD;  Location: [place];  Service: Vascular;;  . LOWER EXTREMITY VENOGRAPHY N/A 08/17/2018   Procedure: LOWER EXTREMITY VENOGRAPHY - Central Venogram;  Surgeon: Waynetta Sandy, MD;  Location: [facility];  Service: Cardiovascular;  Laterality: N/A;  . LOWER EXTREMITY VENOGRAPHY Bilateral 03/09/2019   Procedure: LOWER EXTREMITY VENOGRAPHY;  Surgeon: Waynetta Sandy, MD;  Location: [facility] [facility];  Service: Cardiovascular;  Laterality: Bilateral;  . LOWER EXTREMITY VENOGRAPHY Left 08/16/2019   Procedure: LOWER EXTREMITY VENOGRAPHY;  Surgeon: Waynetta Sandy, MD;  Location: [place] [facility];  Service: Cardiovascular;  Laterality: Left;  . LUMBAR FUSION  11/21/2000   L5-S1  . LUMBAR SPINE SURGERY     x 2 others  . PATCH ANGIOPLASTY Left 03/30/2019   Procedure: Patch Angioplasty of the Left Common Femoral Vein using Venosure Biologic patch;  Surgeon: Waynetta Sandy, MD;  Location: [place];  Service: Vascular;  Laterality: Left;  . PERIPHERAL VASCULAR INTERVENTION Left 08/16/2019   Procedure: PERIPHERAL VASCULAR INTERVENTION;  Surgeon: Waynetta Sandy, MD;  Location: [place] [facility];  Service: Cardiovascular;  Laterality: Left;  common femoral/femoral vein stent  . TRIGGER FINGER RELEASE Right 12/01/2017   Procedure: RELEASE TRIGGER FINGER/A-1 PULLEY RIGHT THUMB;  Surgeon: Leanora Cover, MD;  Location:  [place];  Service: Orthopedics;  Laterality: Right;  . TRIGGER FINGER RELEASE Left 01/26/2018   Procedure: LEFT TRIGGER THUMB RELEASE;  Surgeon: Leanora Cover, MD;  Location: [place];  Service: Orthopedics;  Laterality: Left;  . ULTRASOUND GUIDANCE FOR VASCULAR ACCESS Right 03/30/2019   Procedure: Ultrasound-guided cannulation right internal jugular vein;  Surgeon: Waynetta Sandy, MD;  Location: [place];  Service: Vascular;  Laterality: Right;    Body mass index is 39.75 kg/m.   Allergies  Allergen Reactions  . Aripiprazole Other (See Comments)    BECOMES  VIOLENT   . Seroquel [Quetiapine Fumarate] Other (See Comments)    BECOMES VIOLENT  . Chlorpromazine Other (See Comments)    SEVERE ANXIETY   . Gabapentin Other (See Comments)    NIGHTMARES      Current Outpatient Medications:  .  ALPRAZolam (XANAX) 1 MG tablet, Take 1 tablet (1 mg total) by mouth 2 (two) times daily as needed for anxiety., Disp: 60 tablet, Rfl: 2 .  amphetamine-dextroamphetamine (ADDERALL) 30 MG tablet, Take 1 tablet by mouth 2 (two) times daily., Disp: 60 tablet, Rfl: 0 .  aspirin EC 81 MG tablet, Take 81 mg by mouth daily., Disp: , Rfl:  .  atorvastatin (LIPITOR) 20 MG tablet, Take 20 mg by mouth daily., Disp: , Rfl:  .  cetirizine (ZYRTEC) 10 MG tablet, Take 10 mg by mouth daily., Disp: , Rfl:  .  Cholecalciferol (VITAMIN D3) 125 MCG (5000 UT) CAPS, Take 5,000 Units by mouth at bedtime., Disp: , Rfl:  .  cycloSPORINE (RESTASIS) 0.05 % ophthalmic emulsion, Place 1 drop into both eyes 2 (two) times daily., Disp: , Rfl:  .  DULoxetine (CYMBALTA) 60 MG capsule, Take 1 capsule (60 mg total) by mouth 2 (two) times daily., Disp: 60 capsule, Rfl: 2 .  glipiZIDE (GLUCOTROL XL) 5 MG 24 hr tablet, Take 5 mg by mouth daily with breakfast., Disp: , Rfl:  .  hydrochlorothiazide (HYDRODIURIL) 25 MG tablet, Take 25 mg by mouth daily., Disp: , Rfl:  .  levothyroxine (SYNTHROID) 150 MCG tablet, Take 150 mcg by mouth daily before breakfast. , Disp: , Rfl:  .  linaclotide (LINZESS) 145 MCG CAPS capsule, Take 145 mcg by mouth daily. , Disp: , Rfl:  .  Magnesium 250 MG TABS, Take 250  mg by mouth daily., Disp: , Rfl:  .  Melatonin 10 MG TBDP, Take 10 mg by mouth at bedtime as needed (for sleep)., Disp: , Rfl:  .  Multiple Vitamin (MULTIVITAMIN WITH MINERALS) TABS tablet, Take 1 tablet by mouth daily. One-A-Day Women's Multivitamin, Disp: , Rfl:  .  Omega-3 Fatty Acids (FISH OIL) 1000 MG CAPS, Take 1,000 mg by mouth daily. , Disp: , Rfl:  .  pantoprazole (PROTONIX) 40 MG tablet, Take 40 mg by mouth daily., Disp: , Rfl:  .  PAZEO 0.7 % SOLN, Place 1 drop into both eyes daily. , Disp: , Rfl:  .  Polyvinyl Alcohol-Povidone (REFRESH OP), Place 1 drop into both eyes 3 (three) times daily. , Disp: , Rfl:  .  potassium chloride (K-DUR) 10 MEQ tablet, Take 10 mEq by mouth 3 (three) times daily., Disp: , Rfl:  .  pregabalin (LYRICA) 100 MG capsule, Take 100 mg by mouth 3 (three) times daily., Disp: , Rfl:  .  rOPINIRole (REQUIP) 2 MG tablet, Take 2 mg by mouth 2 (two) times daily., Disp: , Rfl:  .  triamcinolone (KENALOG) 0.1 % paste, Use as  directed 1 application in the mouth or throat 4 (four) times daily as needed (mouth ulcers)., Disp: , Rfl:  .  vitamin C (ASCORBIC ACID) 500 MG tablet, Take 500 mg by mouth at bedtime., Disp: , Rfl:  .  warfarin (COUMADIN) 7.5 MG tablet, Take 7.5 mg by mouth daily., Disp: , Rfl:  .  enoxaparin (LOVENOX) 120 MG/0.8ML injection, Inject 0.8 mLs (120 mg total) into the skin every 12 (twelve) hours for 7 days., Disp: 11.2 mL, Rfl: 0 .  furosemide (LASIX) 40 MG tablet, Take 40 mg by mouth as needed for fluid. , Disp: , Rfl:  .  warfarin (COUMADIN) 5 MG tablet, Take 1 tablet (5 mg total) by mouth daily at 6 PM. (Patient not taking: Reported on 08/30/2019), Disp: 30 tablet, Rfl: 6   PHYSICAL EXAM SECTION: 1) BP (!) 145/81   Pulse 74   Temp (!) 97.4 F (36.3 C)   Ht 5\' 10"  (1.778 m)   Wt 277 lb (125.6 kg)   BMI 39.75 kg/m   Body mass index is 39.75 kg/m. General appearance: Well-developed well-nourished no gross deformities   full range of motion  in the left knee no tenderness no joint swelling  MEDICAL DECISION SECTION:  Encounter Diagnosis  Name Primary?  . Left knee pain, unspecified chronicity Yes    Imaging X-ray was [facility] showed mild joint space narrowing and that subtle no real changes may add an effusion  Plan:  (Rx., Inj., surg., Frx, MRI/CT, XR:2)  Observation at this time patient is now asymptomatic after the revascularization of her leg 2:19 PM Arther Abbott, MD  08/30/2019

## 2019-09-03 ENCOUNTER — Ambulatory Visit: Payer: Medicaid Other | Admitting: Vascular Surgery

## 2019-09-03 ENCOUNTER — Encounter (HOSPITAL_COMMUNITY): Payer: Medicaid Other

## 2019-09-15 ENCOUNTER — Other Ambulatory Visit: Payer: Self-pay

## 2019-09-15 ENCOUNTER — Encounter (HOSPITAL_COMMUNITY): Payer: Self-pay | Admitting: Hematology

## 2019-09-15 ENCOUNTER — Inpatient Hospital Stay (HOSPITAL_COMMUNITY): Payer: Medicaid Other

## 2019-09-15 ENCOUNTER — Inpatient Hospital Stay (HOSPITAL_COMMUNITY): Payer: Medicaid Other | Attending: Hematology | Admitting: Hematology

## 2019-09-15 DIAGNOSIS — Z7901 Long term (current) use of anticoagulants: Secondary | ICD-10-CM | POA: Diagnosis not present

## 2019-09-15 DIAGNOSIS — E119 Type 2 diabetes mellitus without complications: Secondary | ICD-10-CM | POA: Insufficient documentation

## 2019-09-15 DIAGNOSIS — D696 Thrombocytopenia, unspecified: Secondary | ICD-10-CM | POA: Diagnosis present

## 2019-09-15 DIAGNOSIS — Z8041 Family history of malignant neoplasm of ovary: Secondary | ICD-10-CM | POA: Insufficient documentation

## 2019-09-15 DIAGNOSIS — I10 Essential (primary) hypertension: Secondary | ICD-10-CM | POA: Insufficient documentation

## 2019-09-15 DIAGNOSIS — Z86718 Personal history of other venous thrombosis and embolism: Secondary | ICD-10-CM | POA: Diagnosis not present

## 2019-09-15 DIAGNOSIS — Z7982 Long term (current) use of aspirin: Secondary | ICD-10-CM | POA: Insufficient documentation

## 2019-09-15 DIAGNOSIS — Z79899 Other long term (current) drug therapy: Secondary | ICD-10-CM | POA: Insufficient documentation

## 2019-09-15 DIAGNOSIS — N183 Chronic kidney disease, stage 3 unspecified: Secondary | ICD-10-CM | POA: Diagnosis not present

## 2019-09-15 DIAGNOSIS — E039 Hypothyroidism, unspecified: Secondary | ICD-10-CM | POA: Diagnosis not present

## 2019-09-15 DIAGNOSIS — D649 Anemia, unspecified: Secondary | ICD-10-CM | POA: Insufficient documentation

## 2019-09-15 HISTORY — DX: Thrombocytopenia, unspecified: D69.6

## 2019-09-15 LAB — CBC WITH DIFFERENTIAL/PLATELET
Abs Immature Granulocytes: 0.02 10*3/uL (ref 0.00–0.07)
Basophils Absolute: 0 10*3/uL (ref 0.0–0.1)
Basophils Relative: 0 %
Eosinophils Absolute: 0.1 10*3/uL (ref 0.0–0.5)
Eosinophils Relative: 2 %
HCT: 39.7 % (ref 36.0–46.0)
Hemoglobin: 12 g/dL (ref 12.0–15.0)
Immature Granulocytes: 0 %
Lymphocytes Relative: 24 %
Lymphs Abs: 1.5 10*3/uL (ref 0.7–4.0)
MCH: 25.5 pg — ABNORMAL LOW (ref 26.0–34.0)
MCHC: 30.2 g/dL (ref 30.0–36.0)
MCV: 84.5 fL (ref 80.0–100.0)
Monocytes Absolute: 0.5 10*3/uL (ref 0.1–1.0)
Monocytes Relative: 8 %
Neutro Abs: 4.1 10*3/uL (ref 1.7–7.7)
Neutrophils Relative %: 66 %
Platelets: 164 10*3/uL (ref 150–400)
RBC: 4.7 MIL/uL (ref 3.87–5.11)
RDW: 19.1 % — ABNORMAL HIGH (ref 11.5–15.5)
WBC: 6.3 10*3/uL (ref 4.0–10.5)
nRBC: 0 % (ref 0.0–0.2)

## 2019-09-15 LAB — IRON AND TIBC
Iron: 86 ug/dL (ref 28–170)
Saturation Ratios: 21 % (ref 10.4–31.8)
TIBC: 412 ug/dL (ref 250–450)
UIBC: 326 ug/dL

## 2019-09-15 LAB — LACTATE DEHYDROGENASE: LDH: 211 U/L — ABNORMAL HIGH (ref 98–192)

## 2019-09-15 LAB — RETICULOCYTES
Immature Retic Fract: 20.3 % — ABNORMAL HIGH (ref 2.3–15.9)
RBC.: 4.7 MIL/uL (ref 3.87–5.11)
Retic Count, Absolute: 66.7 10*3/uL (ref 19.0–186.0)
Retic Ct Pct: 1.4 % (ref 0.4–3.1)

## 2019-09-15 LAB — PROTIME-INR

## 2019-09-15 LAB — FERRITIN: Ferritin: 22 ng/mL (ref 11–307)

## 2019-09-15 LAB — VITAMIN B12: Vitamin B-12: 420 pg/mL (ref 180–914)

## 2019-09-15 LAB — FOLATE: Folate: 90.3 ng/mL (ref >5.9)

## 2019-09-15 NOTE — Progress Notes (Signed)
CONSULT NOTE  Patient Care Team: Sinda Du, MD as PCP - General (Internal Medicine)  CHIEF COMPLAINTS/PURPOSE OF CONSULTATION:  Normocytic anemia and mild thrombocytopenia.  HISTORY OF PRESENTING ILLNESS:  Jane [name] 53 y.o. female is seen in consultation today at the request of Dr. Maryjean Ka for further work-up and management of normocytic anemia and mild thrombocytopenia.  She had anemia ranging between 6 and 8 hemoglobin most part of this year.  During the same time she was hospitalized and underwent multiple vascular procedures on the lower extremity.  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  Denies any bleeding per rectum or melena.  Reported some dark stools but not severely dark.  She reportedly took [place] iron from January through June which did not help.  She reported receiving blood transfusion when she was hospitalized.  Denies any prior history of colonoscopy.  Denies any pica.  Denies any chest pain on exertion, shortness of breath on minimal exertion, presyncopal episodes or palpitations.  She reported having a breast biopsy few years ago which was benign.  She was never smoker.  She is on disability secondary to back injury.  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  No chemical exposure.  Family history significant for sister who was recently diagnosed with ovarian cancer and undergoing chemotherapy.  Maternal aunt reportedly had breast cancer.  Maternal cousins also had breast cancer.    MEDICAL HISTORY:  Past Medical History:  Diagnosis Date  . Anxiety   . Bipolar 1 disorder ([place])   . Bipolar disorder ([place])   . Chronic kidney disease    Stage 3 kidney disease;dx by Dr. Sinda Du.   . Constipation   . Depression   . Diabetes mellitus without complication ([place])   . Dyspnea    with exertion  . GERD (gastroesophageal reflux disease)   . Headache    migraines  . High cholesterol   . History of blood clots   .  History of DVT (deep vein thrombosis)    left leg  . Hypertension    states under control with meds., has been on med. x 2 years  . Hypothyroidism   . Neuropathy   . Obsessive-compulsive disorder   . Peripheral vascular disease ([place])   . Restless leg syndrome   . Trigger thumb of left hand 01/2018  . Trigger thumb of right hand     SURGICAL HISTORY: Past Surgical History:  Procedure Laterality Date  . ABDOMINAL HYSTERECTOMY  06/2016   complete  . APPLICATION OF WOUND VAC Left 04/27/2019   Procedure: APPLICATION OF WOUND VAC LEFT GROIN;  Surgeon: Waynetta Sandy, MD;  Location: [facility];  Service: Vascular;  Laterality: Left;  . AV FISTULA PLACEMENT Left 11/24/2018   Procedure: ARTERIOVENOUS (AV) FISTULA CREATION LEFT SFA TO LEFT FEMORAL VEIN;  Surgeon: Waynetta Sandy, MD;  Location: [facility];  Service: Vascular;  Laterality: Left;  . BACK SURGERY    . CHOLECYSTECTOMY    . FEMORAL ARTERY EXPLORATION  03/30/2019   Procedure: Left Common Femoral Artery and Vein Exploration;  Surgeon: Waynetta Sandy, MD;  Location: [facility];  Service: Vascular;;  . FEMORAL-FEMORAL BYPASS GRAFT Left 11/24/2018   Procedure: BYPASS GRAFT FEMORAL-FEMORAL VENOUS LEFT TO RIGHT PALMA PROCEDURE USING CRYOVEIN;  Surgeon: Waynetta Sandy, MD;  Location: [facility];  Service: Vascular;  Laterality: Left;  . GROIN DEBRIDEMENT Left 04/27/2019   Procedure: GROIN DEBRIDEMENT;  Surgeon: Waynetta Sandy, MD;  [facility];  Service: Vascular;  Laterality: Left;  . INSERTION OF ILIAC STENT  03/30/2019   Procedure: Stent of left common, external iliac veins and left common femoral vein;  Surgeon: Waynetta Sandy, MD;  Location: [facility];  Service: Vascular;;  . LOWER EXTREMITY VENOGRAPHY N/A 08/17/2018   Procedure: LOWER EXTREMITY VENOGRAPHY - Central Venogram;  Surgeon: Waynetta Sandy, MD;  Location: [facility];  Service: Cardiovascular;  Laterality: N/A;  .  LOWER EXTREMITY VENOGRAPHY Bilateral 03/09/2019   Procedure: LOWER EXTREMITY VENOGRAPHY;  Surgeon: Waynetta Sandy, MD;  Location: [facility];  Service: Cardiovascular;  Laterality: Bilateral;  . LOWER EXTREMITY VENOGRAPHY Left 08/16/2019   Procedure: LOWER EXTREMITY VENOGRAPHY;  Surgeon: Waynetta Sandy, MD;  Location: [facility];  Service: Cardiovascular;  Laterality: Left;  . LUMBAR FUSION  11/21/2000   L5-S1  . LUMBAR SPINE SURGERY     x 2 others  . PATCH ANGIOPLASTY Left 03/30/2019   Procedure: Patch Angioplasty of the Left Common Femoral Vein using Venosure Biologic patch;  Surgeon: Waynetta Sandy, MD;  Location: [facility];  Service: Vascular;  Laterality: Left;  . PERIPHERAL VASCULAR INTERVENTION Left 08/16/2019   Procedure: PERIPHERAL VASCULAR INTERVENTION;  Surgeon: Waynetta Sandy, MD;  Location: [facility];  Service: Cardiovascular;  Laterality: Left;  common femoral/femoral vein stent  . thumb surgey    . TRIGGER FINGER RELEASE Right 12/01/2017   Procedure: RELEASE TRIGGER FINGER/A-1 PULLEY RIGHT THUMB;  Surgeon: Leanora Cover, MD;  Location: [facility];  Service: Orthopedics;  Laterality: Right;  . TRIGGER FINGER RELEASE Left 01/26/2018   Procedure: LEFT TRIGGER THUMB RELEASE;  Surgeon: Leanora Cover, MD;  Location: [facility];  Service: Orthopedics;  Laterality: Left;  . ULTRASOUND GUIDANCE FOR VASCULAR ACCESS Right 03/30/2019   Procedure: Ultrasound-guided cannulation right internal jugular vein;  Surgeon: Waynetta Sandy, MD;  Location: [facility];  Service: Vascular;  Laterality: Right;    SOCIAL HISTORY: Social History   Socioeconomic History  . Marital status: Divorced    Spouse name: Not on file  . Number of children: 3  . Years of education: Not on file  . Highest education level: Not on file  Occupational History  . Not on file  Social Needs  . Financial resource strain: Not  on file  . Food insecurity    Worry: Not on file    Inability: Not on file  . Transportation needs    Medical: Not on file    Non-medical: Not on file  Tobacco Use  . Smoking status: Never Smoker  . Smokeless tobacco: Never Used  Substance and Sexual Activity  . Alcohol use: No  . Drug use: No  . Sexual activity: Yes    Partners: Male    Birth control/protection: Surgical  Lifestyle  . Physical activity    Days per week: Not on file    Minutes per session: Not on file  . Stress: Not on file  Relationships  . Social Herbalist on phone: Not on file    Gets together: Not on file    Attends religious service: Not on file    Active member of club or organization: Not on file    Attends meetings of clubs or organizations: Not on file    Relationship status: Not on file  . Intimate partner violence    Fear of current or ex partner: Not on file  Emotionally abused: Not on file    Physically abused: Not on file    Forced sexual activity: Not on file  Other Topics Concern  . Not on file  Social History Narrative  . Not on file    FAMILY HISTORY: Family History  Problem Relation Age of Onset  . Heart disease Mother   . Hyperlipidemia Mother   . Hypertension Mother   . Bipolar disorder Mother   . Diabetes Father   . Heart disease Father   . Hyperlipidemia Father   . Hypertension Father   . Drug abuse Daughter   . ADD / ADHD Daughter   . Drug abuse Daughter   . Anxiety disorder Daughter   . Bipolar disorder Daughter   . Hypertension Sister   . Hypertension Brother   . Hyperlipidemia Brother   . Heart disease Brother   . Bipolar disorder Maternal Aunt   . Suicidality Maternal Aunt     ALLERGIES:  is allergic to aripiprazole; seroquel [quetiapine fumarate]; chlorpromazine; and gabapentin.  MEDICATIONS:  Current Outpatient Medications  Medication Sig Dispense Refill  . ALPRAZolam (XANAX) 1 MG tablet Take 1 tablet (1 mg total) by mouth 2 (two) times  daily as needed for anxiety. (Patient taking differently: Take 2 mg by mouth 2 (two) times daily. ) 60 tablet 2  . amphetamine-dextroamphetamine (ADDERALL) 30 MG tablet Take 1 tablet by mouth 2 (two) times daily. 60 tablet 0  . aspirin EC 81 MG tablet Take 81 mg by mouth daily.    Marland Kitchen atorvastatin (LIPITOR) 20 MG tablet Take 20 mg by mouth daily.    . cetirizine (ZYRTEC) 10 MG tablet Take 10 mg by mouth daily.    . cycloSPORINE (RESTASIS) 0.05 % ophthalmic emulsion Place 1 drop into both eyes 2 (two) times daily.    . DULoxetine (CYMBALTA) 60 MG capsule Take 1 capsule (60 mg total) by mouth 2 (two) times daily. 60 capsule 2  . glipiZIDE (GLUCOTROL XL) 5 MG 24 hr tablet Take 5 mg by mouth daily with breakfast.    . hydrochlorothiazide (HYDRODIURIL) 25 MG tablet Take 25 mg by mouth daily.    Marland Kitchen HYDROcodone-acetaminophen (NORCO) 10-325 MG tablet Take 1 tablet by mouth every 4 (four) hours as needed.    Marland Kitchen levothyroxine (SYNTHROID) 150 MCG tablet Take 150 mcg by mouth daily before breakfast.     . linaclotide (LINZESS) 145 MCG CAPS capsule Take 145 mcg by mouth daily.     . Magnesium 250 MG TABS Take 250 mg by mouth daily.    . Multiple Vitamin (MULTIVITAMIN WITH MINERALS) TABS tablet Take 1 tablet by mouth daily. One-A-Day Women's Multivitamin    . Omega-3 Fatty Acids (FISH OIL) 1000 MG CAPS Take 1,000 mg by mouth daily.     . pantoprazole (PROTONIX) 40 MG tablet Take 40 mg by mouth daily.    Marland Kitchen PAZEO 0.7 % SOLN Place 1 drop into both eyes daily.     . Polyvinyl Alcohol-Povidone (REFRESH OP) Place 1 drop into both eyes 4 (four) times daily.     . potassium chloride (K-DUR) 10 MEQ tablet Take 10 mEq by mouth 3 (three) times daily.    . pregabalin (LYRICA) 100 MG capsule Take 100 mg by mouth 3 (three) times daily.    Marland Kitchen rOPINIRole (REQUIP) 2 MG tablet Take 2 mg by mouth 2 (two) times daily.    . vitamin C (ASCORBIC ACID) 500 MG tablet Take 500 mg by mouth at bedtime.    Marland Kitchen  warfarin (COUMADIN) 5 MG tablet  Take 1 tablet (5 mg total) by mouth daily at 6 PM. 30 tablet 6   No current facility-administered medications for this visit.     REVIEW OF SYSTEMS:   Constitutional: Denies fevers, chills or abnormal night sweats Eyes: Denies blurriness of vision, double vision or watery eyes Ears, nose, mouth, throat, and face: Denies mucositis or sore throat Respiratory: Denies cough, dyspnea or wheezes Cardiovascular: Denies palpitation, chest discomfort.  Positive for left leg swelling. Gastrointestinal:  Denies nausea, heartburn or change in bowel habits Skin: Denies abnormal skin rashes Lymphatics: Denies new lymphadenopathy or easy bruising Neurological:Denies numbness, tingling or new weaknesses Behavioral/Psych: Mood is stable, no new changes  All other systems were reviewed with the patient and are negative.  PHYSICAL EXAMINATION: ECOG PERFORMANCE STATUS: 1 - Symptomatic but completely ambulatory  Vitals:   09/15/19 1317  BP: 140/67  Pulse: 69  Resp: 16  Temp: (!) 97.5 F (36.4 C)  SpO2: 100%   Filed Weights   09/15/19 1317  Weight: 278 lb (126.1 kg)    GENERAL:alert, no distress and comfortable SKIN: skin color, texture, turgor are normal, no rashes or significant lesions EYES: normal, conjunctiva are pink and non-injected, sclera clear OROPHARYNX:no exudate, no erythema and lips, buccal mucosa, and tongue normal  NECK: supple, thyroid normal size, non-tender, without nodularity LYMPH:  no palpable lymphadenopathy in the cervical, axillary or inguinal LUNGS: clear to auscultation and percussion with normal breathing effort HEART: regular rate & rhythm and no murmurs.  Left leg is slightly swollen in the right leg. ABDOMEN:abdomen soft, non-tender and normal bowel sounds Musculoskeletal:no cyanosis of digits and no clubbing  PSYCH: alert & oriented x 3 with fluent speech NEURO: no focal motor/sensory deficits  LABORATORY DATA:  I have reviewed the data as listed Recent  Results (from the past 2160 hour(s))  SARS CORONAVIRUS 2 (TAT 6-24 HRS) Nasopharyngeal Nasopharyngeal Swab     Status: None   Collection Time: 07/23/19  7:11 AM   Specimen: Nasopharyngeal Swab  Result Value Ref Range   SARS Coronavirus 2 NEGATIVE NEGATIVE    Comment: (NOTE) SARS-CoV-2 target nucleic acids are NOT DETECTED. The SARS-CoV-2 RNA is generally detectable in upper and lower respiratory specimens during the acute phase of infection. Negative results do not preclude SARS-CoV-2 infection, do not rule out co-infections with other pathogens, and should not be used as the sole basis for treatment or other patient management decisions. Negative results must be combined with clinical observations, patient history, and epidemiological information. The expected result is Negative. Fact Sheet for Patients: SugarRoll.be Fact Sheet for Healthcare Providers: https://www.woods-mathews.com/ This test is not yet approved or cleared by the [place] FDA and  has been authorized for detection and/or diagnosis of SARS-CoV-2 by FDA under an Emergency Use Authorization (EUA). This EUA will remain  in effect (meaning this test can be used) for the duration of the COVID-19 declaration under Section 56 4(b)(1) of the Act, 21 U.S.C. section 360bbb-3(b)(1), unless the authorization is terminated or revoked sooner. Performed at [facility], [place] [address], [place],  [address]   Novel Coronavirus, NAA (Hosp order, Send-out to Ref Lab; TAT 18-24 hrs     Status: None   Collection Time: 08/13/19 11:18 AM   Specimen: Nasopharyngeal Swab; Respiratory  Result Value Ref Range   SARS-CoV-2, NAA NOT DETECTED NOT DETECTED    Comment: (NOTE) This nucleic acid amplification test was developed and its performance characteristics determined by [employer].  Nucleic acid amplification tests include PCR and TMA. This test has not been FDA cleared  or approved. This test has been authorized by FDA under an Emergency Use Authorization (EUA). This test is only authorized for the duration of time the declaration that circumstances exist justifying the authorization of the emergency use of in vitro diagnostic tests for detection of SARS-CoV-2 virus and/or diagnosis of COVID-19 infection under section 564(b)(1) of the Act, 21 U.S.C. [id number]) (1), unless the authorization is terminated or revoked sooner. When diagnostic testing is negative, the possibility of a false negative result should be considered in the context of a patient's recent exposures and the presence of clinical signs and symptoms consistent with COVID-19. An individual without symptoms of COVID- 19 and who is not shedding SARS-CoV-2 vi rus would expect to have a negative (not detected) result in this assay. Performed At: [facility] [place], [place] [id number] Rush Farmer MD [id number]    Coronavirus Source NASOPHARYNGEAL     Comment: Performed at [facility], [place] [address], [place], [place] [address]  I-STAT, [place] 8     Status: Abnormal   Collection Time: 08/16/19  9:49 AM  Result Value Ref Range   Sodium 142 135 - 145 mmol/L   Potassium 3.7 3.5 - 5.1 mmol/L   Chloride 101 98 - 111 mmol/L   BUN 28 (H) 6 - 20 mg/dL   Creatinine, Ser 1.30 (H) 0.44 - 1.00 mg/dL   Glucose, Bld 115 (H) 70 - 99 mg/dL   Calcium, Ion 1.18 1.15 - 1.40 mmol/L   TCO2 29 22 - 32 mmol/L   Hemoglobin 13.3 12.0 - 15.0 g/dL   HCT 39.0 36.0 - 46.0 %  Glucose, capillary     Status: Abnormal   Collection Time: 08/16/19  2:02 PM  Result Value Ref Range   Glucose-Capillary 58 (L) 70 - 99 mg/dL  Glucose, capillary     Status: Abnormal   Collection Time: 08/16/19  2:28 PM  Result Value Ref Range   Glucose-Capillary 65 (L) 70 - 99 mg/dL  Glucose, capillary     Status: Abnormal   Collection Time: 08/16/19  2:59 PM  Result Value Ref Range    Glucose-Capillary 100 (H) 70 - 99 mg/dL  Glucose, capillary     Status: Abnormal   Collection Time: 08/16/19  5:51 PM  Result Value Ref Range   Glucose-Capillary 125 (H) 70 - 99 mg/dL  APTT     Status: Abnormal   Collection Time: 08/16/19  9:25 PM  Result Value Ref Range   aPTT 38 (H) 24 - 36 seconds    Comment:        IF BASELINE aPTT IS ELEVATED, SUGGEST PATIENT RISK ASSESSMENT BE USED TO DETERMINE APPROPRIATE ANTICOAGULANT THERAPY. Performed at [facility], [place] [address], [place], [place] [address]   Glucose, capillary     Status: Abnormal   Collection Time: 08/16/19  9:30 PM  Result Value Ref Range   Glucose-Capillary 134 (H) 70 - 99 mg/dL  CBC     Status: Abnormal   Collection Time: 08/17/19  3:19 AM  Result Value Ref Range   WBC 5.7 4.0 - 10.5 K/uL   RBC 4.33 3.87 - 5.11 MIL/uL   Hemoglobin 11.2 (L) 12.0 - 15.0 g/dL   HCT 36.0 36.0 - 46.0 %   MCV 83.1 80.0 - 100.0 fL   MCH 25.9 (L) 26.0 - 34.0 pg   MCHC 31.1 30.0 - 36.0  g/dL   RDW 21.2 (H) 11.5 - 15.5 %   Platelets 128 (L) 150 - 400 K/uL   nRBC 0.0 0.0 - 0.2 %    Comment: Performed at [facility], [place] [address] [place], [place] [id number]  Basic metabolic panel      Status: Abnormal   Collection Time: 08/17/19  3:19 AM  Result Value Ref Range   Sodium 140 135 - 145 mmol/L   Potassium 3.3 (L) 3.5 - 5.1 mmol/L   Chloride 100 98 - 111 mmol/L   CO2 28 22 - 32 mmol/L   Glucose, Bld 158 (H) 70 - 99 mg/dL   BUN 22 (H) 6 - 20 mg/dL   Creatinine, Ser 1.15 (H) 0.44 - 1.00 mg/dL   Calcium 9.1 8.9 - 10.3 mg/dL   GFR calc non Af Amer 54 (L) >60 mL/min   GFR calc Af Amer >60 >60 mL/min   Anion gap 12 5 - 15    Comment: Performed at [facility] [address] [place], [place] [address]  Glucose, capillary     Status: Abnormal   Collection Time: 08/17/19  7:46 AM  Result Value Ref Range   Glucose-Capillary 108 (H) 70 - 99 mg/dL  Heparin level (unfractionated)     Status: None   Collection Time:  08/17/19  7:47 AM  Result Value Ref Range   Heparin Unfractionated 0.63 0.30 - 0.70 IU/mL    Comment: (NOTE) If heparin results are below expected values, and patient dosage has  been confirmed, suggest follow up testing of antithrombin III levels. Performed at [facility], [place] [address] [place], [place] [address]   APTT     Status: None   Collection Time: 08/17/19  7:47 AM  Result Value Ref Range   aPTT 30 24 - 36 seconds    Comment: Performed at [facility] [address], [place], [place] [address]  Glucose, capillary     Status: Abnormal   Collection Time: 08/17/19 11:11 AM  Result Value Ref Range   Glucose-Capillary 174 (H) 70 - 99 mg/dL  Protime-INR     Status: None   Collection Time: 08/17/19  4:38 PM  Result Value Ref Range   Prothrombin Time 12.5 11.4 - 15.2 seconds   INR 0.9 0.8 - 1.2    Comment: (NOTE) INR goal varies based on device and disease states. Performed at [facility], [place] [address] [place], [place] [address]   APTT     Status: Abnormal   Collection Time: 08/17/19  4:38 PM  Result Value Ref Range   aPTT 43 (H) 24 - 36 seconds    Comment:        IF BASELINE aPTT IS ELEVATED, SUGGEST PATIENT RISK ASSESSMENT BE USED TO DETERMINE APPROPRIATE ANTICOAGULANT THERAPY. Performed at [facility], [place] [address], [place], [place] [address]   Glucose, capillary     Status: None   Collection Time: 08/17/19  4:41 PM  Result Value Ref Range   Glucose-Capillary 97 70 - 99 mg/dL  Glucose, capillary     Status: Abnormal   Collection Time: 08/17/19  8:42 PM  Result Value Ref Range   Glucose-Capillary 104 (H) 70 - 99 mg/dL  CBC     Status: Abnormal   Collection Time: 08/18/19  2:29 AM  Result Value Ref Range   WBC 5.6 4.0 - 10.5 K/uL   RBC 4.38 3.87 - 5.11 MIL/uL  Hemoglobin 11.3 (L) 12.0 - 15.0 g/dL   HCT 36.2 36.0 - 46.0 %   MCV 82.6 80.0 - 100.0 fL   MCH 25.8 (L) 26.0 - 34.0 pg   MCHC 31.2 30.0 - 36.0 g/dL   RDW 21.0 (H)  11.5 - 15.5 %   Platelets 136 (L) 150 - 400 K/uL   nRBC 0.0 0.0 - 0.2 %    Comment: Performed at [facility] [address], [place], [place] [address]  Protime-INR     Status: None   Collection Time: 08/18/19  2:29 AM  Result Value Ref Range   Prothrombin Time 12.5 11.4 - 15.2 seconds   INR 0.9 0.8 - 1.2    Comment: (NOTE) INR goal varies based on device and disease states. Performed at [facility], [place] [address] [place], [place] [id number]   Basic metabolic panel     Status: Abnormal   Collection Time: 08/18/19  2:29 AM  Result Value Ref Range   Sodium 138 135 - 145 mmol/L   Potassium 3.7 3.5 - 5.1 mmol/L   Chloride 101 98 - 111 mmol/L   CO2 25 22 - 32 mmol/L   Glucose, Bld 131 (H) 70 - 99 mg/dL   BUN 20 6 - 20 mg/dL   Creatinine, Ser 0.97 0.44 - 1.00 mg/dL   Calcium 9.3 8.9 - 10.3 mg/dL   GFR calc non Af Amer >60 >60 mL/min   GFR calc Af Amer >60 >60 mL/min   Anion gap 12 5 - 15    Comment: Performed at [facility] [address] [place], [place] [address]  APTT     Status: Abnormal   Collection Time: 08/18/19  2:29 AM  Result Value Ref Range   aPTT 60 (H) 24 - 36 seconds    Comment:        IF BASELINE aPTT IS ELEVATED, SUGGEST PATIENT RISK ASSESSMENT BE USED TO DETERMINE APPROPRIATE ANTICOAGULANT THERAPY. Performed at [facility], [place] [address] [place], [place] [address]   Heparin level (unfractionated)     Status: None   Collection Time: 08/18/19  2:29 AM  Result Value Ref Range   Heparin Unfractionated 0.47 0.30 - 0.70 IU/mL    Comment: (NOTE) If heparin results are below expected values, and patient dosage has  been confirmed, suggest follow up testing of antithrombin III levels. Performed at [facility], [place] [address], [place], [place] [address]   Glucose, capillary     Status: Abnormal   Collection Time: 08/18/19  7:46 AM  Result Value Ref Range   Glucose-Capillary 120 (H) 70 - 99 mg/dL   Comment 1 Notify RN     Comment 2 Document in Chart   Glucose, capillary     Status: Abnormal   Collection Time: 08/18/19 11:24 AM  Result Value Ref Range   Glucose-Capillary 132 (H) 70 - 99 mg/dL   Comment 1 Notify RN    Comment 2 Document in Chart   Protime-INR     Status: Abnormal   Collection Time: 08/27/19  9:23 AM  Result Value Ref Range   Prothrombin Time 21.7 (H) 11.4 - 15.2 seconds   INR 1.9 (H) 0.8 - 1.2    Comment: (NOTE) INR goal varies based on device and disease states. Performed at [facility], [address] [place],  [address]     RADIOGRAPHIC STUDIES: I have personally reviewed the radiological images as listed and agreed  with the findings in the report.  ASSESSMENT & PLAN:  Normocytic anemia 1.  Normocytic anemia: -She had severe anemia with hemoglobin between 6 and 9 through most part of the ER from January through August 2020. -She denies any bleeding per rectum but has dark stools.  She never had a colonoscopy. -She has taken iron tablet from January through June of this year without improvement in her blood. -She had received blood transfusion during the most recent hospitalization and 2 years ago when she had a hysterectomy. -Most recent CBC on 08/18/2019 showed improved hemoglobin of 11.3. -We talked about various etiologies of anemia.  We will check her CBC today and rule out nutritional deficiencies including ferritin, iron panel, [id number], folic acid, methylmalonic acid and copper levels.  We will also check SPEP.  We will check LDH and reticulocyte count.  We will also check her stool for occult blood.  2.  Mild thrombocytopenia: -She also had mild thrombocytopenia when she had anemia.  We will check ANA, rheumatoid factor, anticardiolipin and antibeta-2 glycoprotein 1 antibodies.  3.  Recurrent left leg DVT: -She had first episode of left leg DVT in 2014 and was briefly anticoagulated. -She had recurrent episodes during this year, while she was on Xarelto.  She is now  switched to warfarin.     All questions were answered. The patient knows to call the clinic with any problems, questions or concerns.     Derek Jack, MD 09/15/19 2:09 PM

## 2019-09-15 NOTE — Patient Instructions (Signed)
[facility] at [facility] Discharge Instructions  You were seen today by Dr. Katragadda. He went over your history, family history and how you've been feeling lately. He will have blood drawn today. He will see you back in 3 weeks for follow up.   Thank you for choosing [facility] at [facility] to provide your oncology and hematology care.  To afford each patient quality time with our provider, please arrive at least 15 minutes before your scheduled appointment time.   If you have a lab appointment with the Cancer Center please come in thru the  Main Entrance and check in at the main information desk  You need to re-schedule your appointment should you arrive 10 or more minutes late.  We strive to give you quality time with our providers, and arriving late affects you and other patients whose appointments are after yours.  Also, if you no show three or more times for appointments you may be dismissed from the clinic at the providers discretion.     Again, thank you for choosing [facility].  Our hope is that these requests will decrease the amount of time that you wait before being seen by our physicians.       _____________________________________________________________  Should you have questions after your visit to [facility], please contact our office at [phone] between the hours of 8:00 a.m. and 4:30 p.m.  Voicemails left after 4:00 p.m. will not be returned until the following business day.  For prescription refill requests, have your pharmacy contact our office and allow 72 hours.    Cancer Center Support Programs:   > Cancer Support Group  2nd Tuesday of the month 1pm-2pm, Journey Room    

## 2019-09-15 NOTE — Assessment & Plan Note (Signed)
1.  Normocytic anemia: -She had severe anemia with hemoglobin between 6 and 9 through most part of the ER from January through August 2020. -She denies any bleeding per rectum but has dark stools.  She never had a colonoscopy. -She has taken iron tablet from January through June of this year without improvement in her blood. -She had received blood transfusion during the most recent hospitalization and 2 years ago when she had a hysterectomy. -Most recent CBC on 08/18/2019 showed improved hemoglobin of 11.3. -We talked about various etiologies of anemia.  We will check her CBC today and rule out nutritional deficiencies including ferritin, iron panel, 123456, folic acid, methylmalonic acid and copper levels.  We will also check SPEP.  We will check LDH and reticulocyte count.  We will also check her stool for occult blood.  2.  Mild thrombocytopenia: -She also had mild thrombocytopenia when she had anemia.  We will check ANA, rheumatoid factor, anticardiolipin and antibeta-2 glycoprotein 1 antibodies.  3.  Recurrent left leg DVT: -She had first episode of left leg DVT in 2014 and was briefly anticoagulated. -She had recurrent episodes during this year, while she was on Xarelto.  She is now switched to warfarin.

## 2019-09-16 LAB — BETA-2-GLYCOPROTEIN I ABS, IGG/M/A
Beta-2 Glyco I IgG: <9 GPI IgG units (ref 0–20)
Beta-2-Glycoprotein I IgA: <9 GPI IgA units (ref 0–25)
Beta-2-Glycoprotein I IgM: <9 GPI IgM units (ref 0–32)

## 2019-09-16 LAB — RHEUMATOID FACTOR: Rheumatoid fact SerPl-aCnc: <10 IU/mL (ref 0.0–13.9)

## 2019-09-17 ENCOUNTER — Encounter: Payer: Self-pay | Admitting: Vascular Surgery

## 2019-09-17 ENCOUNTER — Ambulatory Visit (INDEPENDENT_AMBULATORY_CARE_PROVIDER_SITE_OTHER): Payer: Medicaid Other | Admitting: Vascular Surgery

## 2019-09-17 ENCOUNTER — Other Ambulatory Visit: Payer: Self-pay

## 2019-09-17 ENCOUNTER — Ambulatory Visit (HOSPITAL_COMMUNITY)
Admission: RE | Admit: 2019-09-17 | Discharge: 2019-09-17 | Disposition: A | Discharge status: Home / Self Care | Payer: Medicaid Other | Source: Ambulatory Visit | Attending: Vascular Surgery | Admitting: Vascular Surgery

## 2019-09-17 VITALS — BP 129/74 | HR 66 | Temp 97.7°F | Resp 20 | Ht 70.0 in | Wt 274.0 lb

## 2019-09-17 DIAGNOSIS — I87002 Postthrombotic syndrome without complications of left lower extremity: Secondary | ICD-10-CM | POA: Diagnosis present

## 2019-09-17 DIAGNOSIS — M7989 Other specified soft tissue disorders: Secondary | ICD-10-CM | POA: Diagnosis present

## 2019-09-17 DIAGNOSIS — I871 Compression of vein: Secondary | ICD-10-CM | POA: Insufficient documentation

## 2019-09-17 LAB — PROTEIN ELECTROPHORESIS, SERUM
A/G Ratio: 1.1 (ref 0.7–1.7)
Albumin ELP: 3.7 g/dL (ref 2.9–4.4)
Alpha-1-Globulin: 0.3 g/dL (ref 0.0–0.4)
Alpha-2-Globulin: 0.6 g/dL (ref 0.4–1.0)
Beta Globulin: 1.3 g/dL (ref 0.7–1.3)
Gamma Globulin: 1.2 g/dL (ref 0.4–1.8)
Globulin, Total: 3.3 g/dL (ref 2.2–3.9)
Total Protein ELP: 7 g/dL (ref 6.0–8.5)

## 2019-09-17 LAB — CARDIOLIPIN ANTIBODIES, IGG, IGM, IGA
Anticardiolipin IgA: <9 APL U/mL (ref 0–11)
Anticardiolipin IgG: <9 GPL U/mL (ref 0–14)
Anticardiolipin IgM: <9 MPL U/mL (ref 0–12)

## 2019-09-17 NOTE — Progress Notes (Signed)
Patient ID: Jane [name], female   DOB: 1966/07/17, 53 y.o.   MRN: [id number]  Reason for Consult: Post-op Follow-up   Referred by Sinda Du, MD  Subjective:     HPI:  Jane [name] is a 53 y.o. 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  She is undergone multiple procedures including the left to right femorofemoral venous bypass.  She subsequently underwent stenting with the bypass occluded.  She does have a known created AV fistula left groin.  Recently she underwent popliteal approach balloon angioplasty stenting of the left common femoral femoral veins.  Since that time her swelling has significantly improved.  We did switch her to Coumadin.  She also takes aspirin.  She has been more active.  She does wear her compression stockings religiously.  Overall she is very pleased with her results.  Past Medical History:  Diagnosis Date  . Anxiety   . Bipolar 1 disorder ([place])   . Bipolar disorder ([place])   . Chronic kidney disease    Stage 3 kidney disease;dx by Dr. Sinda Du.   . Constipation   . Depression   . Diabetes mellitus without complication ([place])   . Dyspnea    with exertion  . GERD (gastroesophageal reflux disease)   . Headache    migraines  . High cholesterol   . History of blood clots   . History of DVT (deep vein thrombosis)    left leg  . Hypertension    states under control with meds., has been on med. x 2 years  . Hypothyroidism   . Neuropathy   . Obsessive-compulsive disorder   . Peripheral vascular disease ([place])   . Restless leg syndrome   . Trigger thumb of left hand 01/2018  . Trigger thumb of right hand    Family History  Problem Relation Age of Onset  . Heart disease Mother   . Hyperlipidemia Mother   . Hypertension Mother   . Bipolar disorder Mother   . Diabetes Father   . Heart disease Father   . Hyperlipidemia Father   . Hypertension Father   . Drug abuse Daughter   . ADD /  ADHD Daughter   . Drug abuse Daughter   . Anxiety disorder Daughter   . Bipolar disorder Daughter   . Hypertension Sister   . Hypertension Brother   . Hyperlipidemia Brother   . Heart disease Brother   . Bipolar disorder Maternal Aunt   . Suicidality Maternal Aunt    Past Surgical History:  Procedure Laterality Date  . ABDOMINAL HYSTERECTOMY  06/2016   complete  . APPLICATION OF WOUND VAC Left 04/27/2019   Procedure: APPLICATION OF WOUND VAC LEFT GROIN;  Surgeon: Waynetta Sandy, MD;  Location: [place];  Service: Vascular;  Laterality: Left;  . AV FISTULA PLACEMENT Left 11/24/2018   Procedure: ARTERIOVENOUS (AV) FISTULA CREATION LEFT SFA TO LEFT FEMORAL VEIN;  Surgeon: Waynetta Sandy, MD;  Location: [place];  Service: Vascular;  Laterality: Left;  . BACK SURGERY    . CHOLECYSTECTOMY    . FEMORAL ARTERY EXPLORATION  03/30/2019   Procedure: Left Common Femoral Artery and Vein Exploration;  Surgeon: Waynetta Sandy, MD;  Location: [place];  Service: Vascular;;  . FEMORAL-FEMORAL BYPASS GRAFT Left 11/24/2018   Procedure: BYPASS GRAFT FEMORAL-FEMORAL VENOUS LEFT TO RIGHT PALMA PROCEDURE USING CRYOVEIN;  Surgeon: Waynetta Sandy, MD;  Location: [place];  Service: Vascular;  Laterality: Left;  .  GROIN DEBRIDEMENT Left 04/27/2019   Procedure: GROIN DEBRIDEMENT;  Surgeon: Waynetta Sandy, MD;  Location: [place];  Service: Vascular;  Laterality: Left;  . INSERTION OF ILIAC STENT  03/30/2019   Procedure: Stent of left common, external iliac veins and left common femoral vein;  Surgeon: Waynetta Sandy, MD;  Location: [place];  Service: Vascular;;  . LOWER EXTREMITY VENOGRAPHY N/A 08/17/2018   Procedure: LOWER EXTREMITY VENOGRAPHY - Central Venogram;  Surgeon: Waynetta Sandy, MD;  Location: [place] [facility];  Service: Cardiovascular;  Laterality: N/A;  . LOWER EXTREMITY VENOGRAPHY Bilateral 03/09/2019   Procedure: LOWER EXTREMITY VENOGRAPHY;   Surgeon: Waynetta Sandy, MD;  Location: [place] [facility];  Service: Cardiovascular;  Laterality: Bilateral;  . LOWER EXTREMITY VENOGRAPHY Left 08/16/2019   Procedure: LOWER EXTREMITY VENOGRAPHY;  Surgeon: Waynetta Sandy, MD;  Location: [place] [facility];  Service: Cardiovascular;  Laterality: Left;  . LUMBAR FUSION  11/21/2000   L5-S1  . LUMBAR SPINE SURGERY     x 2 others  . PATCH ANGIOPLASTY Left 03/30/2019   Procedure: Patch Angioplasty of the Left Common Femoral Vein using Venosure Biologic patch;  Surgeon: Waynetta Sandy, MD;  Location: [place];  Service: Vascular;  Laterality: Left;  . PERIPHERAL VASCULAR INTERVENTION Left 08/16/2019   Procedure: PERIPHERAL VASCULAR INTERVENTION;  Surgeon: Waynetta Sandy, MD;  Location: [place] [facility];  Service: Cardiovascular;  Laterality: Left;  common femoral/femoral vein stent  . thumb surgey    . TRIGGER FINGER RELEASE Right 12/01/2017   Procedure: RELEASE TRIGGER FINGER/A-1 PULLEY RIGHT THUMB;  Surgeon: Leanora Cover, MD;  Location: [place];  Service: Orthopedics;  Laterality: Right;  . TRIGGER FINGER RELEASE Left 01/26/2018   Procedure: LEFT TRIGGER THUMB RELEASE;  Surgeon: Leanora Cover, MD;  Location: [place];  Service: Orthopedics;  Laterality: Left;  . ULTRASOUND GUIDANCE FOR VASCULAR ACCESS Right 03/30/2019   Procedure: Ultrasound-guided cannulation right internal jugular vein;  Surgeon: Waynetta Sandy, MD;  Location: [place];  Service: Vascular;  Laterality: Right;    Short Social History:  Social History   Tobacco Use  . Smoking status: Never Smoker  . Smokeless tobacco: Never Used  Substance Use Topics  . Alcohol use: No    Allergies  Allergen Reactions  . Aripiprazole Other (See Comments)    BECOMES  VIOLENT   . Seroquel [Quetiapine Fumarate] Other (See Comments)    BECOMES VIOLENT  . Chlorpromazine Other (See Comments)    SEVERE ANXIETY    . Gabapentin Other (See Comments)    NIGHTMARES     Current Outpatient Medications  Medication Sig Dispense Refill  . ALPRAZolam (XANAX) 1 MG tablet Take 1 tablet (1 mg total) by mouth 2 (two) times daily as needed for anxiety. (Patient taking differently: Take 2 mg by mouth 2 (two) times daily. ) 60 tablet 2  . amphetamine-dextroamphetamine (ADDERALL) 30 MG tablet Take 1 tablet by mouth 2 (two) times daily. 60 tablet 0  . aspirin EC 81 MG tablet Take 81 mg by mouth daily.    Marland Kitchen atorvastatin (LIPITOR) 20 MG tablet Take 20 mg by mouth daily.    . cetirizine (ZYRTEC) 10 MG tablet Take 10 mg by mouth daily.    . cycloSPORINE (RESTASIS) 0.05 % ophthalmic emulsion Place 1 drop into both eyes 2 (two) times daily.    . DULoxetine (CYMBALTA) 60 MG capsule Take 1 capsule (60 mg total) by mouth 2 (two) times daily.  60 capsule 2  . glipiZIDE (GLUCOTROL XL) 5 MG 24 hr tablet Take 5 mg by mouth daily with breakfast.    . hydrochlorothiazide (HYDRODIURIL) 25 MG tablet Take 25 mg by mouth daily.    Marland Kitchen HYDROcodone-acetaminophen (NORCO) 10-325 MG tablet Take 1 tablet by mouth every 4 (four) hours as needed.    Marland Kitchen levothyroxine (SYNTHROID) 150 MCG tablet Take 150 mcg by mouth daily before breakfast.     . linaclotide (LINZESS) 145 MCG CAPS capsule Take 145 mcg by mouth daily.     . Magnesium 250 MG TABS Take 250 mg by mouth daily.    . Multiple Vitamin (MULTIVITAMIN WITH MINERALS) TABS tablet Take 1 tablet by mouth daily. One-A-Day Women's Multivitamin    . Omega-3 Fatty Acids (FISH OIL) 1000 MG CAPS Take 1,000 mg by mouth daily.     . pantoprazole (PROTONIX) 40 MG tablet Take 40 mg by mouth daily.    Marland Kitchen PAZEO 0.7 % SOLN Place 1 drop into both eyes daily.     . Polyvinyl Alcohol-Povidone (REFRESH OP) Place 1 drop into both eyes 4 (four) times daily.     . potassium chloride (K-DUR) 10 MEQ tablet Take 10 mEq by mouth 3 (three) times daily.    . pregabalin (LYRICA) 100 MG capsule Take 100 mg by mouth 3 (three)  times daily.    Marland Kitchen rOPINIRole (REQUIP) 2 MG tablet Take 2 mg by mouth 2 (two) times daily.    . vitamin C (ASCORBIC ACID) 500 MG tablet Take 500 mg by mouth at bedtime.    Marland Kitchen warfarin (COUMADIN) 5 MG tablet Take 1 tablet (5 mg total) by mouth daily at 6 PM. 30 tablet 6   No current facility-administered medications for this visit.     Review of Systems  Constitutional:  Constitutional negative. HENT: HENT negative.  Eyes: Eyes negative.  Cardiovascular: Positive for leg swelling.  GI: Gastrointestinal negative.  Musculoskeletal: Musculoskeletal negative.  Skin: Skin negative.  Neurological: Neurological negative. Hematologic: Hematologic/lymphatic negative.  Psychiatric: Psychiatric negative.        Objective:  Objective   Vitals:   09/17/19 0844  BP: 129/74  Pulse: 66  Resp: 20  Temp: 97.7 F (36.5 C)  SpO2: 100%  Weight: 274 lb (124.3 kg)  Height: 5\' 10"  (1.778 m)   Body mass index is 39.31 kg/m.  Physical Exam HENT:     Head: Normocephalic.     Nose: Nose normal.     Mouth/Throat:     Mouth: Mucous membranes are moist.  Eyes:     Pupils: Pupils are equal, round, and reactive to light.  Cardiovascular:     Rate and Rhythm: Normal rate.     Pulses:          Radial pulses are 2+ on the right side and 2+ on the left side.       Dorsalis pedis pulses are 2+ on the right side and 2+ on the left side.  Pulmonary:     Effort: Pulmonary effort is normal.  Abdominal:     General: Abdomen is flat.     Palpations: Abdomen is soft.  Musculoskeletal:     Comments: Right leg measures 40 cm and left leg measures 40.5 cm 10 cm from the tibial tuberosity  Skin:    Capillary Refill: Capillary refill takes less than 2 seconds.  Neurological:     General: No focal deficit present.     Mental Status: She is alert.  Psychiatric:  Mood and Affect: Mood normal.        Thought Content: Thought content normal.        Judgment: Judgment normal.     Data: I  independently interpreted her IVC iliac studies which demonstrates patent stents and a patent AV fistula between the common femoral vein and artery.     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  She has been through multiple procedures and finally her legs are very similar in size approximately only 1.5% different where in the past she has been 25% increased on the left relative to the right.  We will continue her on Coumadin indefinitely as well as aspirin.  She does have an AV fistula in the groin which we have considered shutting down from her arterial approach but at this time given how well she is doing we will not proceed with any further procedures.  We will get her to follow-up in a few months with repeat studies should she not have issues prior.  She will continue her compression stockings, exercise as we discussed.     Waynetta Sandy MD [facility]

## 2019-09-18 LAB — LUPUS ANTICOAGULANT PANEL
DRVVT: 70.6 s — ABNORMAL HIGH (ref 0.0–47.0)
PTT Lupus Anticoagulant: 55.3 s — ABNORMAL HIGH (ref 0.0–51.9)

## 2019-09-18 LAB — PTT-LA INCUB MIX: PTT-LA Incub Mix: 40 s (ref 0.0–48.9)

## 2019-09-18 LAB — DRVVT MIX: dRVVT Mix: 44.6 s (ref 0.0–47.0)

## 2019-09-18 LAB — PTT-LA MIX: PTT-LA Mix: 41.6 s (ref 0.0–48.9)

## 2019-09-18 LAB — COPPER, SERUM: Copper: 154 ug/dL (ref 72–166)

## 2019-09-21 ENCOUNTER — Other Ambulatory Visit: Payer: Self-pay

## 2019-09-21 DIAGNOSIS — I871 Compression of vein: Secondary | ICD-10-CM

## 2019-09-21 LAB — METHYLMALONIC ACID, SERUM: Methylmalonic Acid, Quantitative: 242 nmol/L (ref 0–378)

## 2019-09-21 LAB — ANTINUCLEAR ANTIBODIES, IFA: ANA Ab, IFA: NEGATIVE

## 2019-10-06 ENCOUNTER — Encounter (HOSPITAL_COMMUNITY): Payer: Self-pay | Admitting: Hematology

## 2019-10-06 ENCOUNTER — Inpatient Hospital Stay (HOSPITAL_COMMUNITY): Payer: Medicaid Other | Attending: Hematology | Admitting: Hematology

## 2019-10-06 ENCOUNTER — Other Ambulatory Visit: Payer: Self-pay

## 2019-10-06 VITALS — BP 136/71 | HR 77 | Temp 97.9°F | Resp 18 | Wt 281.5 lb

## 2019-10-06 DIAGNOSIS — Z86718 Personal history of other venous thrombosis and embolism: Secondary | ICD-10-CM | POA: Insufficient documentation

## 2019-10-06 DIAGNOSIS — D696 Thrombocytopenia, unspecified: Secondary | ICD-10-CM | POA: Insufficient documentation

## 2019-10-06 DIAGNOSIS — D649 Anemia, unspecified: Secondary | ICD-10-CM | POA: Insufficient documentation

## 2019-10-06 DIAGNOSIS — Z7982 Long term (current) use of aspirin: Secondary | ICD-10-CM | POA: Insufficient documentation

## 2019-10-06 DIAGNOSIS — Z79899 Other long term (current) drug therapy: Secondary | ICD-10-CM | POA: Insufficient documentation

## 2019-10-06 NOTE — Patient Instructions (Addendum)
[place] at [facility] Discharge Instructions  You were seen today by Dr. Delton Coombes. He went over your recent lab results. He will schedule you for 2 weekly doses of IV iron. He will see you back in 3 months for labs and follow up.   Thank you for choosing [place] at [facility] to provide your oncology and hematology care.  To afford each patient quality time with our provider, please arrive at least 15 minutes before your scheduled appointment time.   If you have a lab appointment with the [place] please come in thru the  Main Entrance and check in at the main information desk  You need to re-schedule your appointment should you arrive 10 or more minutes late.  We strive to give you quality time with our providers, and arriving late affects you and other patients whose appointments are after yours.  Also, if you no show three or more times for appointments you may be dismissed from the clinic at the providers discretion.     Again, thank you for choosing [facility]  Our hope is that these requests will decrease the amount of time that you wait before being seen by our physicians.       _____________________________________________________________  Should you have questions after your visit to [facility], please contact our office at [phone] between the hours of 8:00 a.m. and 4:30 p.m.  Voicemails left after 4:00 p.m. will not be returned until the following business day.  For prescription refill requests, have your pharmacy contact our office and allow 72 hours.    Cancer Center Support Programs:   > Cancer Support Group  2nd Tuesday of the month 1pm-2pm, Journey Room

## 2019-10-06 NOTE — Assessment & Plan Note (Signed)
1.  Normocytic anemia: -She had severe anemia with hemoglobin between 6 and 9 from January through August 2020. -Denies any bleeding per rectum but has dark stools.  Never had a colonoscopy. -She had taken iron pill from January through June of this year without improvement of her blood. -Last blood transfusion was during most recent hospitalization in May.  She also had blood transfusion 2 years ago when she had a hysterectomy. -We reviewed blood work from 09/15/2019 which showed hemoglobin 12.  Ferritin was low at 22.  123456 and folic acid were normal.  SPEP was normal. -She will continue B12 supplements. -She complains of fatigue.  As she has poor absorption with iron pills, I have recommended 2 infusions of Feraheme weekly.  We talked about the side effects including the rare chance of allergic reactions. -I plan to see her back in 3 months with repeat labs.  2.  Mild thrombocytopenia: -She has mild thrombocytopenia.  We checked ANA and rheumatoid factor which was normal. -Lupus anticoagulant, anticardiolipin antibodies and antibeta-2 glycoprotein antibodies were negative. -Latest platelet count on 09/15/2019 was normal at 164.  3.  Recurrent left leg DVT: -She had first episode of left leg DVT in 2014 and was briefly anticoagulated. -A recurrent episodes during this year while she was on Xarelto.  She is now switched to warfarin.

## 2019-10-06 NOTE — Progress Notes (Signed)
Jane [name], [place] [id number]   CLINIC:  Medical Oncology/Hematology  PCP:  Sinda Du, [place] [address] [phone]   REASON FOR VISIT:  Follow-up for normocytic anemia and thrombocytopenia.  CURRENT THERAPY: Weekly Feraheme x2.   INTERVAL HISTORY:  Jane [name] 53 y.o. female seen for follow-up of normocytic anemia and mild thrombocytopenia.  She complains of fatigue.  Appetite is 50%.  Energy levels are low.  Back pain in the lower back and left knee has been rated as 8 out of 10.  She has some sleep problems and chronic leg swelling.  Denies any bleeding per rectum.  She has dark stools.  She is continuing B12 tablets.    REVIEW OF SYSTEMS:  Review of Systems  Cardiovascular: Positive for leg swelling.  Psychiatric/Behavioral: Positive for sleep disturbance.  All other systems reviewed and are negative.    PAST MEDICAL/SURGICAL HISTORY:  Past Medical History:  Diagnosis Date  . Anxiety   . Bipolar 1 disorder ([facility])   . Bipolar disorder ([facility])   . Chronic kidney disease    Stage 3 kidney disease;dx by Dr. Sinda Du.   . Constipation   . Depression   . Diabetes mellitus without complication ([facility])   . Dyspnea    with exertion  . GERD (gastroesophageal reflux disease)   . Headache    migraines  . High cholesterol   . History of blood clots   . History of DVT (deep vein thrombosis)    left leg  . Hypertension    states under control with meds., has been on med. x 2 years  . Hypothyroidism   . Neuropathy   . Obsessive-compulsive disorder   . Peripheral vascular disease ([facility])   . Restless leg syndrome   . Trigger thumb of left hand 01/2018  . Trigger thumb of right hand    Past Surgical History:  Procedure Laterality Date  . ABDOMINAL HYSTERECTOMY  06/2016   complete  . APPLICATION OF WOUND VAC Left 04/27/2019   Procedure: APPLICATION OF WOUND VAC LEFT GROIN;  Surgeon: Waynetta Sandy, MD;  Location: [facility];  Service: Vascular;  Laterality: Left;  . AV FISTULA PLACEMENT Left 11/24/2018   Procedure: ARTERIOVENOUS (AV) FISTULA CREATION LEFT SFA TO LEFT FEMORAL VEIN;  Surgeon: Waynetta Sandy, MD;  Location: [facility];  Service: Vascular;  Laterality: Left;  . BACK SURGERY    . CHOLECYSTECTOMY    . FEMORAL ARTERY EXPLORATION  03/30/2019   Procedure: Left Common Femoral Artery and Vein Exploration;  Surgeon: Waynetta Sandy, MD;  Location: [facility];  Service: Vascular;;  . FEMORAL-FEMORAL BYPASS GRAFT Left 11/24/2018   Procedure: BYPASS GRAFT FEMORAL-FEMORAL VENOUS LEFT TO RIGHT PALMA PROCEDURE USING CRYOVEIN;  Surgeon: Waynetta Sandy, MD;  Location: [facility];  Service: Vascular;  Laterality: Left;  . GROIN DEBRIDEMENT Left 04/27/2019   Procedure: GROIN DEBRIDEMENT;  Surgeon: Waynetta Sandy, MD;  Location: [facility];  Service: Vascular;  Laterality: Left;  . INSERTION OF ILIAC STENT  03/30/2019   Procedure: Stent of left common, external iliac veins and left common femoral vein;  Surgeon: Waynetta Sandy, MD;  Location: [facility];  Service: Vascular;;  . LOWER EXTREMITY VENOGRAPHY N/A 08/17/2018   Procedure: LOWER EXTREMITY VENOGRAPHY - Central Venogram;  Surgeon: Waynetta Sandy, MD;  Location: [facility];  Service: Cardiovascular;  Laterality: N/A;  . LOWER EXTREMITY VENOGRAPHY Bilateral 03/09/2019   Procedure:  LOWER EXTREMITY VENOGRAPHY;  Surgeon: Waynetta Sandy, MD;  Location: [facility];  Service: Cardiovascular;  Laterality: Bilateral;  . LOWER EXTREMITY VENOGRAPHY Left 08/16/2019   Procedure: LOWER EXTREMITY VENOGRAPHY;  Surgeon: Waynetta Sandy, MD;  Location: [facility];  Service: Cardiovascular;  Laterality: Left;  . LUMBAR FUSION  11/21/2000   L5-S1  . LUMBAR SPINE SURGERY     x 2 others  . PATCH ANGIOPLASTY Left 03/30/2019   Procedure: Patch Angioplasty of the Left  Common Femoral Vein using Venosure Biologic patch;  Surgeon: Waynetta Sandy, MD;  Location: [facility];  Service: Vascular;  Laterality: Left;  . PERIPHERAL VASCULAR INTERVENTION Left 08/16/2019   Procedure: PERIPHERAL VASCULAR INTERVENTION;  Surgeon: Waynetta Sandy, MD;  Location: [facility];  Service: Cardiovascular;  Laterality: Left;  common femoral/femoral vein stent  . thumb surgey    . TRIGGER FINGER RELEASE Right 12/01/2017   Procedure: RELEASE TRIGGER FINGER/A-1 PULLEY RIGHT THUMB;  Surgeon: Leanora Cover, MD;  Location: [facility];  Service: Orthopedics;  Laterality: Right;  . TRIGGER FINGER RELEASE Left 01/26/2018   Procedure: LEFT TRIGGER THUMB RELEASE;  Surgeon: Leanora Cover, MD;  Location: [facility];  Service: Orthopedics;  Laterality: Left;  . ULTRASOUND GUIDANCE FOR VASCULAR ACCESS Right 03/30/2019   Procedure: Ultrasound-guided cannulation right internal jugular vein;  Surgeon: Waynetta Sandy, MD;  Location: [facility];  Service: Vascular;  Laterality: Right;     SOCIAL HISTORY:  Social History   Socioeconomic History  . Marital status: Divorced    Spouse name: Not on file  . Number of children: 3  . Years of education: Not on file  . Highest education level: Not on file  Occupational History  . Not on file  Social Needs  . Financial resource strain: Not on file  . Food insecurity    Worry: Not on file    Inability: Not on file  . Transportation needs    Medical: Not on file    Non-medical: Not on file  Tobacco Use  . Smoking status: Never Smoker  . Smokeless tobacco: Never Used  Substance and Sexual Activity  . Alcohol use: No  . Drug use: No  . Sexual activity: Yes    Partners: Male    Birth control/protection: Surgical  Lifestyle  . Physical activity    Days per week: Not on file    Minutes per session: Not on file  . Stress: Not on file  Relationships  . Social Herbalist on phone:  Not on file    Gets together: Not on file    Attends religious service: Not on file    Active member of club or organization: Not on file    Attends meetings of clubs or organizations: Not on file    Relationship status: Not on file  . Intimate partner violence    Fear of current or ex partner: Not on file    Emotionally abused: Not on file    Physically abused: Not on file    Forced sexual activity: Not on file  Other Topics Concern  . Not on file  Social History Narrative  . Not on file    FAMILY HISTORY:  Family History  Problem Relation Age of Onset  . Heart disease Mother   . Hyperlipidemia Mother   . Hypertension Mother   . Bipolar disorder Mother   . Diabetes Father   . Heart disease  Father   . Hyperlipidemia Father   . Hypertension Father   . Drug abuse Daughter   . ADD / ADHD Daughter   . Drug abuse Daughter   . Anxiety disorder Daughter   . Bipolar disorder Daughter   . Hypertension Sister   . Hypertension Brother   . Hyperlipidemia Brother   . Heart disease Brother   . Bipolar disorder Maternal Aunt   . Suicidality Maternal Aunt     CURRENT MEDICATIONS:  Outpatient Encounter Medications as of 10/06/2019  Medication Sig Note  . amphetamine-dextroamphetamine (ADDERALL) 30 MG tablet Take 1 tablet by mouth 2 (two) times daily. 08/17/2019: LF @ [facility] on 07-31-19 # 60 Ds30  . aspirin EC 81 MG tablet Take 81 mg by mouth daily.   Marland Kitchen atorvastatin (LIPITOR) 20 MG tablet Take 20 mg by mouth daily.   . cetirizine (ZYRTEC) 10 MG tablet Take 10 mg by mouth daily.   . cycloSPORINE (RESTASIS) 0.05 % ophthalmic emulsion Place 1 drop into both eyes 2 (two) times daily.   . DULoxetine (CYMBALTA) 60 MG capsule Take 1 capsule (60 mg total) by mouth 2 (two) times daily.   Marland Kitchen glipiZIDE (GLUCOTROL XL) 5 MG 24 hr tablet Take 5 mg by mouth daily with breakfast.   . hydrochlorothiazide (HYDRODIURIL) 25 MG tablet Take 25 mg by mouth daily.   Marland Kitchen HYDROcodone-acetaminophen (NORCO)  10-325 MG tablet Take 1 tablet by mouth every 4 (four) hours as needed.   Marland Kitchen levothyroxine (SYNTHROID) 150 MCG tablet Take 150 mcg by mouth daily before breakfast.    . linaclotide (LINZESS) 145 MCG CAPS capsule Take 145 mcg by mouth daily.    . Magnesium 250 MG TABS Take 250 mg by mouth daily.   . Multiple Vitamin (MULTIVITAMIN WITH MINERALS) TABS tablet Take 1 tablet by mouth daily. One-A-Day Women's Multivitamin   . Omega-3 Fatty Acids (FISH OIL) 1000 MG CAPS Take 1,000 mg by mouth daily.    . pantoprazole (PROTONIX) 40 MG tablet Take 40 mg by mouth daily.   Marland Kitchen PAZEO 0.7 % SOLN Place 1 drop into both eyes daily.    . Polyvinyl Alcohol-Povidone (REFRESH OP) Place 1 drop into both eyes 4 (four) times daily.    . potassium chloride (K-DUR) 10 MEQ tablet Take 10 mEq by mouth 3 (three) times daily.   . pregabalin (LYRICA) 100 MG capsule Take 100 mg by mouth 3 (three) times daily.   Marland Kitchen rOPINIRole (REQUIP) 2 MG tablet Take 2 mg by mouth 2 (two) times daily.   . vitamin C (ASCORBIC ACID) 500 MG tablet Take 500 mg by mouth at bedtime.   Marland Kitchen warfarin (COUMADIN) 5 MG tablet Take 1 tablet (5 mg total) by mouth daily at 6 PM.   . ALPRAZolam (XANAX) 1 MG tablet Take 1 tablet (1 mg total) by mouth 2 (two) times daily as needed for anxiety. (Patient not taking: Reported on 10/06/2019)    No facility-administered encounter medications on file as of 10/06/2019.     ALLERGIES:  Allergies  Allergen Reactions  . Aripiprazole Other (See Comments)    BECOMES  VIOLENT   . Seroquel [Quetiapine Fumarate] Other (See Comments)    BECOMES VIOLENT  . Chlorpromazine Other (See Comments)    SEVERE ANXIETY   . Gabapentin Other (See Comments)    NIGHTMARES      PHYSICAL EXAM:  ECOG Performance status: 1  Vitals:   10/06/19 1538  BP: 136/71  Pulse: 77  Resp: 18  Temp:  97.9 F (36.6 C)  SpO2: 97%   Filed Weights   10/06/19 1538  Weight: 281 lb 8 oz (127.7 kg)    Physical Exam Vitals signs reviewed.   Constitutional:      Appearance: Normal appearance.  Cardiovascular:     Rate and Rhythm: Normal rate and regular rhythm.     Heart sounds: Normal heart sounds.  Pulmonary:     Effort: Pulmonary effort is normal.     Breath sounds: Normal breath sounds.  Abdominal:     General: There is no distension.     Palpations: Abdomen is soft. There is no mass.  Skin:    General: Skin is warm.  Neurological:     General: No focal deficit present.     Mental Status: She is alert and oriented to person, place, and time.  Psychiatric:        Mood and Affect: Mood normal.        Behavior: Behavior normal.      LABORATORY DATA:  I have reviewed the labs as listed.  CBC    Component Value Date/Time   WBC 6.3 09/15/2019 1415   RBC 4.70 09/15/2019 1415   RBC 4.70 09/15/2019 1415   HGB 12.0 09/15/2019 1415   HCT 39.7 09/15/2019 1415   PLT 164 09/15/2019 1415   MCV 84.5 09/15/2019 1415   MCH 25.5 (L) 09/15/2019 1415   MCHC 30.2 09/15/2019 1415   RDW 19.1 (H) 09/15/2019 1415   LYMPHSABS 1.5 09/15/2019 1415   MONOABS 0.5 09/15/2019 1415   EOSABS 0.1 09/15/2019 1415   BASOSABS 0.0 09/15/2019 1415   CMP Latest Ref Rng & Units 08/18/2019 08/17/2019 08/16/2019  Glucose 70 - 99 mg/dL 131(H) 158(H) 115(H)  BUN 6 - 20 mg/dL 20 22(H) 28(H)  Creatinine 0.44 - 1.00 mg/dL 0.97 1.15(H) 1.30(H)  Sodium 135 - 145 mmol/L 138 140 142  Potassium 3.5 - 5.1 mmol/L 3.7 3.3(L) 3.7  Chloride 98 - 111 mmol/L 101 100 101  CO2 22 - 32 mmol/L 25 28 -  Calcium 8.9 - 10.3 mg/dL 9.3 9.1 -  Total Protein 6.5 - 8.1 g/dL - - -  Total Bilirubin 0.3 - 1.2 mg/dL - - -  Alkaline Phos 38 - 126 U/L - - -  AST 15 - 41 U/L - - -  ALT 0 - 44 U/L - - -       DIAGNOSTIC IMAGING:  I have independently reviewed the scans and discussed with the patient.    ASSESSMENT & PLAN:   Normocytic anemia 1.  Normocytic anemia: -She had severe anemia with hemoglobin between 6 and 9 from January through August 2020.  -Denies any bleeding per rectum but has dark stools.  Never had a colonoscopy. -She had taken iron pill from January through June of this year without improvement of her blood. -Last blood transfusion was during most recent hospitalization in May.  She also had blood transfusion 2 years ago when she had a hysterectomy. -We reviewed blood work from 09/15/2019 which showed hemoglobin 12.  Ferritin was low at 22.  123456 and folic acid were normal.  SPEP was normal. -She will continue B12 supplements. -She complains of fatigue.  As she has poor absorption with iron pills, I have recommended 2 infusions of Feraheme weekly.  We talked about the side effects including the rare chance of allergic reactions. -I plan to see her back in 3 months with repeat labs.  2.  Mild thrombocytopenia: -She has mild  thrombocytopenia.  We checked ANA and rheumatoid factor which was normal. -Lupus anticoagulant, anticardiolipin antibodies and antibeta-2 glycoprotein antibodies were negative. -Latest platelet count on 09/15/2019 was normal at 164.  3.  Recurrent left leg DVT: -She had first episode of left leg DVT in 2014 and was briefly anticoagulated. -A recurrent episodes during this year while she was on Xarelto.  She is now switched to warfarin.      Orders placed this encounter:  Orders Placed This Encounter  Procedures  . CBC with Differential/Platelet  . Iron and TIBC  . Ferritin      Derek Jack, MD [facility] [phone]

## 2019-10-11 ENCOUNTER — Other Ambulatory Visit: Payer: Self-pay

## 2019-10-11 ENCOUNTER — Encounter: Payer: Self-pay | Admitting: Orthopedic Surgery

## 2019-10-11 ENCOUNTER — Ambulatory Visit: Payer: Medicaid Other | Admitting: Orthopedic Surgery

## 2019-10-11 ENCOUNTER — Telehealth: Payer: Self-pay | Admitting: Radiology

## 2019-10-11 VITALS — BP 155/85 | HR 72 | Ht 70.0 in | Wt 284.0 lb

## 2019-10-11 DIAGNOSIS — Z6841 Body Mass Index (BMI) 40.0 and over, adult: Secondary | ICD-10-CM | POA: Diagnosis not present

## 2019-10-11 DIAGNOSIS — M7052 Other bursitis of knee, left knee: Secondary | ICD-10-CM | POA: Diagnosis not present

## 2019-10-11 NOTE — Telephone Encounter (Signed)
-----   Message from Uvaldo Bristle sent at 10/11/2019  9:32 AM EST ----- Regarding: patient asked if Rx sent [facility] -    Sanyi Board  [id number] - asked at ck-out if her prescription has been sent to [facility]? (know Dr is still completing her notes so just relaying)  Thank you

## 2019-10-11 NOTE — Telephone Encounter (Signed)
Left message to advise

## 2019-10-11 NOTE — Telephone Encounter (Signed)
He told her to use topical, this is over the counter, I will call her.

## 2019-10-11 NOTE — Patient Instructions (Addendum)
Ice 3 x a day for 6 weeks   These are the muscle and arthrits creams I recommend:  PLEASE READ THE PACKAGE INSTRUCTIONS BEFORE USING   Ben Gay arthritis cream  Icy hot vanishing gel  Apply 3 x a day for 6 weeks  Pes Anserine Bursitis  The pes anserine is an area on the inside of your knee, just below the joint, that is cushioned by a fluid-filled sac (bursa). Pes anserine bursitis is a condition that happens when the bursa gets swollen and irritated. The condition causes knee pain. What are the causes? This condition may be caused by:  Making the same movement over and over.  A direct hit (trauma) to the inside of the leg. What increases the risk? You are more likely to develop this condition if you:  Are a runner.  Play sports that involve a lot of running and quick side-to-side movements (cutting).  Are an athlete who plays contact sports.  Swim using an inward angle of the knee, such as with the breaststroke.  Have tight hamstring muscles.  Are a woman.  Are overweight.  Have flat feet.  Have diabetes or osteoarthritis. What are the signs or symptoms? Symptoms of this condition include:  Knee pain that gets better with rest and worse with activities like climbing stairs, walking, running, or getting in and out of a chair.  Swelling.  Warmth.  Tenderness when pressing at the inside of the lower leg, just below the knee. How is this diagnosed? This condition may be diagnosed based on:  Your symptoms.  Your medical history.  A physical exam. ? During your physical exam, your health care provider will press on the tendon attachment to see if you feel pain. ? Your health care provider may also check your hip and knee motion and strength.  Tests to check for swelling and fluid buildup in the bursa and to look at muscles, bones, and tendons. These tests might include: ? X-rays. ? MRI. ? Ultrasound. How is this treated? This condition may be treated  by:  Resting your knee. You may be told to raise (elevate) your knee while resting.  Avoiding activities that cause pain.  Icing the inside of your knee.  Sleeping with a pillow between your knees. This will cushion your injured knee.  Taking medicine by mouth (orally) to reduce pain and swelling or having medicine injected into your knee.  Doing strengthening and stretching exercises (physical therapy). If these treatments do not work or if the condition keeps coming back, you may need to have surgery to remove the bursa. Follow these instructions at home: Managing pain, stiffness, and swelling   If directed, put ice on the injured area. ? Put ice in a plastic bag. ? Place a towel between your skin and the bag. ? Leave the ice on for 20 minutes, 2-3 times a day.  Elevate the injured area above the level of your heart while you are sitting or lying down. Activity  Return to your normal activities as told by your health care provider. Ask your health care provider what activities are safe for you.  Do exercises as told by your health care provider. General instructions  Take over-the-counter and prescription medicines only as told by your health care provider.  Sleep with a pillow between your knees.  Do not use any products that contain nicotine or tobacco, such as cigarettes, e-cigarettes, and chewing tobacco. These can delay healing. If you need help quitting, ask your  health care provider.  If you are overweight, work with your health care provider and a dietitian to set a weight-loss goal that is healthy and reasonable for you.  Keep all follow-up visits as told by your health care provider. This is important. How is this prevented?  When exercising, make sure that you: ? Warm up and stretch before being active. ? Cool down and stretch after being active. ? Give your body time to rest between periods of activity. ? Use equipment that fits you. ? Are safe and  responsible while being active to avoid falls. ? Do at least 150 minutes of moderate-intensity exercise each week, such as brisk walking or water aerobics. ? Maintain physical fitness, including:  Strength.  Flexibility.  Cardiovascular fitness.  Endurance. ? Maintain a healthy weight. Contact a health care provider if:  Your symptoms do not improve.  Your symptoms get worse. Summary  Pes anserine bursitis is a condition that happens when the fluid-filled sac (bursa) at the inside of your knee gets swollen and irritated. The condition causes knee pain.  Treatment for pes anserine bursitis may include resting your knee, icing the inside of your knee, sleeping with a pillow between your knees, taking medicine by mouth or by injection, and doing strengthening and stretching exercises (physical therapy).  Follow instructions for managing pain, stiffness, and swelling.  Take over-the-counter and prescription medicines only as told by your health care provider. This information is not intended to replace advice given to you by your health care provider. Make sure you discuss any questions you have with your health care provider. Document Released: 10/21/2005 Document Revised: 02/11/2019 Document Reviewed: 04/01/2018 Elsevier Patient Education  2020 [employer].

## 2019-10-11 NOTE — Progress Notes (Signed)
Jane [name]  10/11/2019  Body mass index is 40.75 kg/m.   HISTORY SECTION :  Chief Complaint  Patient presents with  . Knee Pain    left knee pain    HPI The patient presents for evaluation of  (mild/moderate/severe/ ) severe pain, in the (right /left) left knee, for 2 months, associated with pain over the medial side of the left knee.  Prior treatment none, no trauma noted   Review of Systems  Cardiovascular: Positive for claudication and leg swelling.  Skin:       History of vascular related ulcer     has a past medical history of Anxiety, Bipolar 1 disorder ([place]), Bipolar disorder ([place]), Chronic kidney disease, Constipation, Depression, Diabetes mellitus without complication ([place]), Dyspnea, GERD (gastroesophageal reflux disease), Headache, High cholesterol, History of blood clots, History of DVT (deep vein thrombosis), Hypertension, Hypothyroidism, Neuropathy, Obsessive-compulsive disorder, Peripheral vascular disease ([place]), Restless leg syndrome, Trigger thumb of left hand (01/2018), and Trigger thumb of right hand.   Past Surgical History:  Procedure Laterality Date  . ABDOMINAL HYSTERECTOMY  06/2016   complete  . APPLICATION OF WOUND VAC Left 04/27/2019   Procedure: APPLICATION OF WOUND VAC LEFT GROIN;  Surgeon: Waynetta Sandy, MD;  Location: [place];  Service: Vascular;  Laterality: Left;  . AV FISTULA PLACEMENT Left 11/24/2018   Procedure: ARTERIOVENOUS (AV) FISTULA CREATION LEFT SFA TO LEFT FEMORAL VEIN;  Surgeon: Waynetta Sandy, MD;  Location: [place];  Service: Vascular;  Laterality: Left;  . BACK SURGERY    . CHOLECYSTECTOMY    . FEMORAL ARTERY EXPLORATION  03/30/2019   Procedure: Left Common Femoral Artery and Vein Exploration;  Surgeon: Waynetta Sandy, MD;  Location: [place];  Service: Vascular;;  . FEMORAL-FEMORAL BYPASS GRAFT Left 11/24/2018   Procedure: BYPASS GRAFT FEMORAL-FEMORAL VENOUS LEFT TO RIGHT PALMA PROCEDURE USING CRYOVEIN;   Surgeon: Waynetta Sandy, MD;  Location: [facility];  Service: Vascular;  Laterality: Left;  . GROIN DEBRIDEMENT Left 04/27/2019   Procedure: GROIN DEBRIDEMENT;  Surgeon: Waynetta Sandy, MD;  Location: [place];  Service: Vascular;  Laterality: Left;  . INSERTION OF ILIAC STENT  03/30/2019   Procedure: Stent of left common, external iliac veins and left common femoral vein;  Surgeon: Waynetta Sandy, MD;  Location: [place];  Service: Vascular;;  . LOWER EXTREMITY VENOGRAPHY N/A 08/17/2018   Procedure: LOWER EXTREMITY VENOGRAPHY - Central Venogram;  Surgeon: Waynetta Sandy, MD;  Location: [facility];  Service: Cardiovascular;  Laterality: N/A;  . LOWER EXTREMITY VENOGRAPHY Bilateral 03/09/2019   Procedure: LOWER EXTREMITY VENOGRAPHY;  Surgeon: Waynetta Sandy, MD;  Location: [facility];  Service: Cardiovascular;  Laterality: Bilateral;  . LOWER EXTREMITY VENOGRAPHY Left 08/16/2019   Procedure: LOWER EXTREMITY VENOGRAPHY;  Surgeon: Waynetta Sandy, MD;  Location: [facility];  Service: Cardiovascular;  Laterality: Left;  . LUMBAR FUSION  11/21/2000   L5-S1  . LUMBAR SPINE SURGERY     x 2 others  . PATCH ANGIOPLASTY Left 03/30/2019   Procedure: Patch Angioplasty of the Left Common Femoral Vein using Venosure Biologic patch;  Surgeon: Waynetta Sandy, MD;  Location: [facility];  Service: Vascular;  Laterality: Left;  . PERIPHERAL VASCULAR INTERVENTION Left 08/16/2019   Procedure: PERIPHERAL VASCULAR INTERVENTION;  Surgeon: Waynetta Sandy, MD;  Location: [facility];  Service: Cardiovascular;  Laterality: Left;  common femoral/femoral vein stent  . thumb surgey    . TRIGGER FINGER  RELEASE Right 12/01/2017   Procedure: RELEASE TRIGGER FINGER/A-1 PULLEY RIGHT THUMB;  Surgeon: Leanora Cover, MD;  Location: [place];  Service: Orthopedics;  Laterality: Right;  . TRIGGER FINGER RELEASE Left  01/26/2018   Procedure: LEFT TRIGGER THUMB RELEASE;  Surgeon: Leanora Cover, MD;  Location: [facility];  Service: Orthopedics;  Laterality: Left;  . ULTRASOUND GUIDANCE FOR VASCULAR ACCESS Right 03/30/2019   Procedure: Ultrasound-guided cannulation right internal jugular vein;  Surgeon: Waynetta Sandy, MD;  Location: [facility];  Service: Vascular;  Laterality: Right;    Body mass index is 40.75 kg/m.   Allergies  Allergen Reactions  . Aripiprazole Other (See Comments)    BECOMES  VIOLENT   . Seroquel [Quetiapine Fumarate] Other (See Comments)    BECOMES VIOLENT  . Chlorpromazine Other (See Comments)    SEVERE ANXIETY   . Gabapentin Other (See Comments)    NIGHTMARES      Current Outpatient Medications:  .  ALPRAZolam (XANAX) 1 MG tablet, Take 1 tablet (1 mg total) by mouth 2 (two) times daily as needed for anxiety., Disp: 60 tablet, Rfl: 2 .  amphetamine-dextroamphetamine (ADDERALL) 30 MG tablet, Take 1 tablet by mouth 2 (two) times daily., Disp: 60 tablet, Rfl: 0 .  aspirin EC 81 MG tablet, Take 81 mg by mouth daily., Disp: , Rfl:  .  atorvastatin (LIPITOR) 20 MG tablet, Take 20 mg by mouth daily., Disp: , Rfl:  .  cetirizine (ZYRTEC) 10 MG tablet, Take 10 mg by mouth daily., Disp: , Rfl:  .  cycloSPORINE (RESTASIS) 0.05 % ophthalmic emulsion, Place 1 drop into both eyes 2 (two) times daily., Disp: , Rfl:  .  DULoxetine (CYMBALTA) 60 MG capsule, Take 1 capsule (60 mg total) by mouth 2 (two) times daily., Disp: 60 capsule, Rfl: 2 .  glipiZIDE (GLUCOTROL XL) 5 MG 24 hr tablet, Take 5 mg by mouth daily with breakfast., Disp: , Rfl:  .  hydrochlorothiazide (HYDRODIURIL) 25 MG tablet, Take 25 mg by mouth daily., Disp: , Rfl:  .  HYDROcodone-acetaminophen (NORCO) 10-325 MG tablet, Take 1 tablet by mouth every 4 (four) hours as needed., Disp: , Rfl:  .  levothyroxine (SYNTHROID) 150 MCG tablet, Take 150 mcg by mouth daily before breakfast. , Disp: , Rfl:  .   linaclotide (LINZESS) 145 MCG CAPS capsule, Take 145 mcg by mouth daily. , Disp: , Rfl:  .  Magnesium 250 MG TABS, Take 250 mg by mouth daily., Disp: , Rfl:  .  Multiple Vitamin (MULTIVITAMIN WITH MINERALS) TABS tablet, Take 1 tablet by mouth daily. One-A-Day Women's Multivitamin, Disp: , Rfl:  .  Omega-3 Fatty Acids (FISH OIL) 1000 MG CAPS, Take 1,000 mg by mouth daily. , Disp: , Rfl:  .  pantoprazole (PROTONIX) 40 MG tablet, Take 40 mg by mouth daily., Disp: , Rfl:  .  PAZEO 0.7 % SOLN, Place 1 drop into both eyes daily. , Disp: , Rfl:  .  Polyvinyl Alcohol-Povidone (REFRESH OP), Place 1 drop into both eyes 4 (four) times daily. , Disp: , Rfl:  .  potassium chloride (K-DUR) 10 MEQ tablet, Take 10 mEq by mouth 3 (three) times daily., Disp: , Rfl:  .  pregabalin (LYRICA) 100 MG capsule, Take 100 mg by mouth 3 (three) times daily., Disp: , Rfl:  .  rOPINIRole (REQUIP) 2 MG tablet, Take 2 mg by mouth 2 (two) times daily., Disp: , Rfl:  .  vitamin C (ASCORBIC ACID) 500 MG tablet, Take  500 mg by mouth at bedtime., Disp: , Rfl:  .  warfarin (COUMADIN) 5 MG tablet, Take 1 tablet (5 mg total) by mouth daily at 6 PM., Disp: 30 tablet, Rfl: 6   PHYSICAL EXAM SECTION: 1) BP (!) 155/85   Pulse 72   Ht 5\' 10"  (1.778 m)   Wt 284 lb (128.8 kg)   BMI 40.75 kg/m   Body mass index is 40.75 kg/m. General appearance: Well-developed well-nourished no gross deformities  2) Cardiovascular mild edema left leg  3) Neurologically deep tendon reflexes are equal and normal, no sensation loss or deficits no pathologic reflexes  4) Psychological: Awake alert and oriented x3 mood and affect normal  5) Skin no lacerations or ulcerations no nodularity no palpable masses, no erythema or nodularity  6) Musculoskeletal:   Left knee medial tenderness and swelling over the pes bursa Full range of motion of the knee All ligaments stable Muscle tone normal no trauma no weakness  The patient meets the AMA guidelines  for Morbid (severe) obesity with a BMI > 40.0 and I have recommended weight loss.  MEDICAL DECISION SECTION:  Encounter Diagnoses  Name Primary?  . Pes anserinus bursitis of left knee Yes  . Body mass index 40.0-44.9, adult ([place])   . Morbid obesity ([place])     Imaging Prior x-ray showed no arthritis  Plan:  (Rx., Inj., surg., Frx, MRI/CT, XR:2)  Procedure note Left knee injection for bursitis   verbal consent was obtained to inject Left knee PES BURSA  Timeout was completed to confirm the site of injection  The medications used were 40 mg of Depo-Medrol and 1% lidocaine 3 cc  Anesthesia was provided by ethyl chloride and the skin was prepped with alcohol.  After cleaning the skin with alcohol a 25-gauge needle was used to inject the left knee bursa   There were no complications and a sterile bandage was applied   9:30 AM Arther Abbott, MD  10/11/2019

## 2019-10-14 ENCOUNTER — Inpatient Hospital Stay (HOSPITAL_COMMUNITY): Payer: Medicaid Other

## 2019-10-14 ENCOUNTER — Other Ambulatory Visit: Payer: Self-pay

## 2019-10-14 VITALS — BP 159/79 | HR 63 | Temp 97.1°F | Resp 18

## 2019-10-14 DIAGNOSIS — D649 Anemia, unspecified: Secondary | ICD-10-CM | POA: Diagnosis not present

## 2019-10-14 MED ORDER — SODIUM CHLORIDE 0.9 % IV SOLN
510.0000 mg | Freq: Once | INTRAVENOUS | Status: AC
  Administered 2019-10-14: 510 mg via INTRAVENOUS
  Filled 2019-10-14: qty 510

## 2019-10-14 MED ORDER — SODIUM CHLORIDE 0.9 % IV SOLN
Freq: Once | INTRAVENOUS | Status: AC
  Administered 2019-10-14: 10:00:00 via INTRAVENOUS

## 2019-10-14 NOTE — Progress Notes (Signed)
Jane [name] presents today for IV iron infusion. Infusion tolerated without incident or complaint. See MAR for details. VSS prior to and post infusion. Discharged in satisfactory condition with follow up instructions.

## 2019-10-14 NOTE — Patient Instructions (Signed)
[facility] at  Hospital  Discharge Instructions:  IV iron received today.  Ferumoxytol injection What is this medicine? FERUMOXYTOL is an iron complex. Iron is used to make healthy red blood cells, which carry oxygen and nutrients throughout the body. This medicine is used to treat iron deficiency anemia. This medicine may be used for other purposes; ask your health care provider or pharmacist if you have questions. COMMON BRAND NAME(S): Feraheme What should I tell my health care provider before I take this medicine? They need to know if you have any of these conditions:  anemia not caused by low iron levels  high levels of iron in the blood  magnetic resonance imaging (MRI) test scheduled  an unusual or allergic reaction to iron, other medicines, foods, dyes, or preservatives  pregnant or trying to get pregnant  breast-feeding How should I use this medicine? This medicine is for injection into a vein. It is given by a health care professional in a hospital or clinic setting. Talk to your pediatrician regarding the use of this medicine in children. Special care may be needed. Overdosage: If you think you have taken too much of this medicine contact a poison control center or emergency room at once. NOTE: This medicine is only for you. Do not share this medicine with others. What if I miss a dose? It is important not to miss your dose. Call your doctor or health care professional if you are unable to keep an appointment. What may interact with this medicine? This medicine may interact with the following medications:  other iron products This list may not describe all possible interactions. Give your health care provider a list of all the medicines, herbs, non-prescription drugs, or dietary supplements you use. Also tell them if you smoke, drink alcohol, or use illegal drugs. Some items may interact with your medicine. What should I watch for while using  this medicine? Visit your doctor or healthcare professional regularly. Tell your doctor or healthcare professional if your symptoms do not start to get better or if they get worse. You may need blood work done while you are taking this medicine. You may need to follow a special diet. Talk to your doctor. Foods that contain iron include: whole grains/cereals, dried fruits, beans, or peas, leafy green vegetables, and organ meats (liver, kidney). What side effects may I notice from receiving this medicine? Side effects that you should report to your doctor or health care professional as soon as possible:  allergic reactions like skin rash, itching or hives, swelling of the face, lips, or tongue  breathing problems  changes in blood pressure  feeling faint or lightheaded, falls  fever or chills  flushing, sweating, or hot feelings  swelling of the ankles or feet Side effects that usually do not require medical attention (report to your doctor or health care professional if they continue or are bothersome):  diarrhea  headache  nausea, vomiting  stomach pain This list may not describe all possible side effects. Call your doctor for medical advice about side effects. You may report side effects to FDA at 1-800-FDA-1088. Where should I keep my medicine? This drug is given in a hospital or clinic and will not be stored at home. NOTE: This sheet is a summary. It may not cover all possible information. If you have questions about this medicine, talk to your doctor, pharmacist, or health care provider.  2020 Elsevier/Gold Standard (2016-12-09 20:21:10)  _______________________________________________________________  Thank you for choosing   [facility] at [facility] to provide your oncology and hematology care.  To afford each patient quality time with our providers, please arrive at least 15 minutes before your scheduled appointment.  You need to re-schedule your  appointment if you arrive 10 or more minutes late.  We strive to give you quality time with our providers, and arriving late affects you and other patients whose appointments are after yours.  Also, if you no show three or more times for appointments you may be dismissed from the clinic.  Again, thank you for choosing [facility] at [facility]. Our hope is that these requests will allow you access to exceptional care and in a timely manner. _______________________________________________________________  If you have questions after your visit, please contact our office at [phone] between the hours of 8:30 a.m. and 5:00 p.m. Voicemails left after 4:30 p.m. will not be returned until the following business day. _______________________________________________________________  For prescription refill requests, have your pharmacy contact our office. _______________________________________________________________  Recommendations made by the consultant and any test results will be sent to your referring physician. _______________________________________________________________ 

## 2019-10-21 ENCOUNTER — Inpatient Hospital Stay (HOSPITAL_COMMUNITY): Payer: Medicaid Other

## 2019-10-21 ENCOUNTER — Encounter (HOSPITAL_COMMUNITY): Payer: Self-pay

## 2019-10-21 ENCOUNTER — Other Ambulatory Visit: Payer: Self-pay

## 2019-10-21 VITALS — BP 135/71 | HR 64 | Temp 97.1°F | Resp 18

## 2019-10-21 DIAGNOSIS — D649 Anemia, unspecified: Secondary | ICD-10-CM | POA: Diagnosis not present

## 2019-10-21 MED ORDER — SODIUM CHLORIDE 0.9 % IV SOLN
510.0000 mg | Freq: Once | INTRAVENOUS | Status: AC
  Administered 2019-10-21: 510 mg via INTRAVENOUS
  Filled 2019-10-21: qty 510

## 2019-10-21 MED ORDER — SODIUM CHLORIDE 0.9 % IV SOLN: Freq: Once | INTRAVENOUS | Status: AC

## 2019-10-21 NOTE — Patient Instructions (Signed)
[facility]  Discharge Instructions:   _______________________________________________________________  Thank you for choosing [facility] at [facility] to provide your oncology and hematology care.  To afford each patient quality time with our providers, please arrive at least 15 minutes before your scheduled appointment.  You need to re-schedule your appointment if you arrive 10 or more minutes late.  We strive to give you quality time with our providers, and arriving late affects you and other patients whose appointments are after yours.  Also, if you no show three or more times for appointments you may be dismissed from the clinic.  Again, thank you for choosing [facility] at [facility]. Our hope is that these requests will allow you access to exceptional care and in a timely manner. _______________________________________________________________  If you have questions after your visit, please contact our office at [phone] between the hours of 8:30 a.m. and 5:00 p.m. Voicemails left after 4:30 p.m. will not be returned until the following business day. _______________________________________________________________  For prescription refill requests, have your pharmacy contact our office. _______________________________________________________________  Recommendations made by the consultant and any test results will be sent to your referring physician. _______________________________________________________________ 

## 2019-10-21 NOTE — Progress Notes (Signed)
Patient presents today for Feraheme infusion. Patient has no complaints of any pain or changes since the last visit. Multiple bruises noted on arms and hands bilateral. Patient take Coumadin and states that they are trying to get her dosage correct.   Feraheme given today per MD orders. Tolerated infusion without adverse affects. Vital signs stable. No complaints at this time. Discharged from clinic ambulatory. F/U with [facility] as scheduled.

## 2019-10-25 ENCOUNTER — Other Ambulatory Visit: Payer: Self-pay

## 2019-10-25 ENCOUNTER — Encounter: Payer: Self-pay | Admitting: Family Medicine

## 2019-10-25 ENCOUNTER — Ambulatory Visit: Payer: Medicaid Other | Admitting: Family Medicine

## 2019-10-25 VITALS — BP 138/76 | HR 66 | Temp 97.6°F | Resp 16 | Ht 70.0 in | Wt 282.0 lb

## 2019-10-25 DIAGNOSIS — I10 Essential (primary) hypertension: Secondary | ICD-10-CM

## 2019-10-25 DIAGNOSIS — F429 Obsessive-compulsive disorder, unspecified: Secondary | ICD-10-CM

## 2019-10-25 DIAGNOSIS — E1169 Type 2 diabetes mellitus with other specified complication: Secondary | ICD-10-CM

## 2019-10-25 DIAGNOSIS — F319 Bipolar disorder, unspecified: Secondary | ICD-10-CM

## 2019-10-25 DIAGNOSIS — Z86718 Personal history of other venous thrombosis and embolism: Secondary | ICD-10-CM

## 2019-10-25 DIAGNOSIS — G8929 Other chronic pain: Secondary | ICD-10-CM

## 2019-10-25 DIAGNOSIS — I871 Compression of vein: Secondary | ICD-10-CM | POA: Diagnosis not present

## 2019-10-25 DIAGNOSIS — J309 Allergic rhinitis, unspecified: Secondary | ICD-10-CM | POA: Insufficient documentation

## 2019-10-25 DIAGNOSIS — E1159 Type 2 diabetes mellitus with other circulatory complications: Secondary | ICD-10-CM | POA: Insufficient documentation

## 2019-10-25 DIAGNOSIS — Z7901 Long term (current) use of anticoagulants: Secondary | ICD-10-CM

## 2019-10-25 DIAGNOSIS — F5105 Insomnia due to other mental disorder: Secondary | ICD-10-CM

## 2019-10-25 DIAGNOSIS — F331 Major depressive disorder, recurrent, moderate: Secondary | ICD-10-CM

## 2019-10-25 DIAGNOSIS — E039 Hypothyroidism, unspecified: Secondary | ICD-10-CM | POA: Diagnosis not present

## 2019-10-25 DIAGNOSIS — Z9889 Other specified postprocedural states: Secondary | ICD-10-CM

## 2019-10-25 DIAGNOSIS — G2581 Restless legs syndrome: Secondary | ICD-10-CM

## 2019-10-25 DIAGNOSIS — D649 Anemia, unspecified: Secondary | ICD-10-CM

## 2019-10-25 DIAGNOSIS — N183 Chronic kidney disease, stage 3 unspecified: Secondary | ICD-10-CM

## 2019-10-25 DIAGNOSIS — K219 Gastro-esophageal reflux disease without esophagitis: Secondary | ICD-10-CM

## 2019-10-25 DIAGNOSIS — E1122 Type 2 diabetes mellitus with diabetic chronic kidney disease: Secondary | ICD-10-CM

## 2019-10-25 DIAGNOSIS — Z1211 Encounter for screening for malignant neoplasm of colon: Secondary | ICD-10-CM

## 2019-10-25 DIAGNOSIS — G629 Polyneuropathy, unspecified: Secondary | ICD-10-CM | POA: Insufficient documentation

## 2019-10-25 DIAGNOSIS — M5442 Lumbago with sciatica, left side: Secondary | ICD-10-CM

## 2019-10-25 DIAGNOSIS — J301 Allergic rhinitis due to pollen: Secondary | ICD-10-CM

## 2019-10-25 DIAGNOSIS — G6289 Other specified polyneuropathies: Secondary | ICD-10-CM

## 2019-10-25 DIAGNOSIS — E785 Hyperlipidemia, unspecified: Secondary | ICD-10-CM

## 2019-10-25 DIAGNOSIS — K5909 Other constipation: Secondary | ICD-10-CM | POA: Insufficient documentation

## 2019-10-25 DIAGNOSIS — M5441 Lumbago with sciatica, right side: Secondary | ICD-10-CM

## 2019-10-25 NOTE — Assessment & Plan Note (Signed)
Chronic and stable Continue PPI 

## 2019-10-25 NOTE — Assessment & Plan Note (Signed)
Chronic and stable Continue Zyrtec as needed

## 2019-10-25 NOTE — Assessment & Plan Note (Signed)
Likely related to chronic opioid use Continue Linzess Screening colonoscopy

## 2019-10-25 NOTE — Assessment & Plan Note (Signed)
Followed by behavioral health in [place] No changes to medications today

## 2019-10-25 NOTE — Assessment & Plan Note (Signed)
Followed by behavioral health in [place] No changes to medications today

## 2019-10-25 NOTE — Assessment & Plan Note (Addendum)
Continue atorvastatin 20 mg daily and baby aspirin daily Recheck CMP and FLP Reviewed last lipid panel in her records from 08/2017 that showed LDL of 108 Goal LDL less than 70 in the setting of diabetes

## 2019-10-25 NOTE — Assessment & Plan Note (Signed)
Well controlled Continue current medications Recheck metabolic panel F/u in 3-6 months  

## 2019-10-25 NOTE — Assessment & Plan Note (Signed)
Discussed importance of healthy weight management Discussed diet and exercise Encouraged walking regularly, as this may help with her back and joint pain as well May be able to consider gastric bypass or other bariatric surgery in the future, but given all of her recent surgeries, she is not a good candidate at this time

## 2019-10-25 NOTE — Assessment & Plan Note (Signed)
Chronic and stable Continue Requip Reportedly predates anemia, but Feraheme may help this as well

## 2019-10-25 NOTE — Assessment & Plan Note (Signed)
Related to multiple back surgeries and T2DM Reports allergic reaction to gabapentin, but seems to be taking Lyrica regularly per podiatry Last foot exam requested from podiatry

## 2019-10-25 NOTE — Assessment & Plan Note (Signed)
Patient with CKD 3 with last creatinine 1.32 with a GFR of 46 in 06/2019 per record review from previous PCP Recheck metabolic panel Avoid NSAIDs and other nephrotoxic medications Important to keep diabetes and blood pressure well controlled

## 2019-10-25 NOTE — Assessment & Plan Note (Signed)
Followed by hematology She has undergone Feraheme infusions x2 She has history of blood transfusions during hospitalization She is getting following up with hematology in about 3 months We will get her screening colonoscopy in the meantime Reviewed last CBC and iron panel Discussed with patient no need to recheck those at this time

## 2019-10-25 NOTE — Assessment & Plan Note (Signed)
Chronic with some recent worsening Has had multiple back surgeries previously Has been on Norco for many years Patient would benefit from regular exercise and possibly from interventional pain management Referral placed to pain management in [place] Of note, from record review, her last urine drug screen was completed in 02/2018 and showed pan negative, including negative for opiates and amphetamines From review of the controlled substances database, seems that she has been filling 120 pills of Norco regularly No prescription for pain medication was given today Would have her see pain management before prescribing any pain medication, especially in the setting of this inconsistent urine drug screen

## 2019-10-25 NOTE — Assessment & Plan Note (Signed)
Record review from previous PCP shows that her diabetes is uncontrolled with last [name] 7.5 on 11/18/2018 We will continue glipizide at current dose Repeat [name] May need dose titrations or other medications May benefit from a  GLP-1 Would need to consider her GFR before trying an SGLT2 We will send ROI to ophthalmology and podiatry for last foot and eye exam Follow-up in 3 months May need a urine microalbumin at next visit On statin

## 2019-10-25 NOTE — Assessment & Plan Note (Signed)
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

## 2019-10-25 NOTE — Assessment & Plan Note (Signed)
Previously uncontrolled Record review from her previous PCP shows last TSH on 06/29/2019 was 7.92. Repeat TSH Continue current dose of Synthroid pending results

## 2019-10-25 NOTE — Assessment & Plan Note (Signed)
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

## 2019-10-25 NOTE — Assessment & Plan Note (Signed)
Followed by behavioral health in [place] No changes to medications today

## 2019-10-25 NOTE — Progress Notes (Signed)
Patient: Jane [name], Female    DOB: [birth year], 53 y.o.   MRN: [id number] Visit Date: 10/25/2019  Today's Provider: Lavon Paganini, MD   Chief Complaint  Patient presents with  . New Patient (Initial Visit)   Subjective:     Annual physical exam Jane [name] is a 53 y.o. female who presents today to establish care. Patient is transferring from [facility] in [place]. Patient here today C/O left lower back pain x's several years. Patient reports she has had surgery in the past. Patient reports that back pain has been worse in the last month.  After her mother passed away in 02-15-2000, she started dealing with depression and bipolar disorder.  Sees Dr Harrington Challenger at [facility] in [place].  She is taking Xanax, Adderall, Cymbalta .  Reports that this year has been very bad as she had 5 surgeries this year.  She is also treated for GAD and OCD.  Has hisotry of recurrent DVTs.  Followed by Dr Donzetta Matters ([facility]).  She is s/p fem-fem bypass graft that was complicated by infection.  She then got a stent, which was complicated by a DVT.  She was tested in 2013/02/14 nd told that she had no genetic hypercoaguable state.  She is on chronic anticoagulation with Warfarin. She had DVT when on a DOAC, so she is now on Warfarin.  She also takes an aspirin daily.  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  Last INR 12/10 - was told that it was in range at 2.9.  She is getting iron infusions with Hem/Onc.  Thought to be related to multiple surgeries this year.  Has never had a colonoscopy.  She reports dificulty with coming out of sedation after a hysterectomy many years ago.  CKD3: She believes this was related to a depression medication she took in the past.  Has not seen a Nephrology.  Long-standing RLS that pre-dates back surgeries.  She is taking Requip regularly.  Seeing derm regularly for surveillance.  States she  had a large lesion removed from her back that was thought to be cancerous, but the pathology was benign.  H/o 3 back surgeries by Dr Carloyn Manner ([place]) for herniated discs. She has sciatic nerve damage.  She has done ESIs previously.  They were helpful, but she has not had any recently.  She had a recent injury with sciatica down L leg for last 4-6 weeks.  Has been on Norco for many years (previously prescribed by her PCP). Feels like it is not helpful.  Taking linzess for constipation.  Keeps her regular and having soft BMs  Recently saw a Sports Med Doctor in [place] for L knee pain Was treated with corticosteroid injection (from record review, this was for pes anserine bursitis)  Peripheral neuropathy related to back surgeries and diabetes.  Has had allergic reaction to gabapentin, but review of the controlled substances database shows that she is taking Lyrica regularly.  Allergic rhinitis: Zyrtec as needed  Hypothyroidism: She is taking levothyroxine 150 mcg daily with good compliance.  She denies any recent missed doses.  She thinks it has been a while since her last TSH was checked.  HLD - medications: Atorvastatin 20 mg daily, aspirin 81 mg daily - compliance: Good - medication SEs: None  HTN: - Medications: Hydrochlorothiazide 25 mg daily - Compliance: Good - Checking BP at home: No - Denies any SOB, CP, vision  changes, LE edema, medication SEs, or symptoms of hypotension - Diet: " Could be better" - Exercise: None currently   T2DM - Checking BG at home: No - Medications: Glipizide XL 5 mg daily - Compliance: Good - eye exam: Seeing ophthalmology regularly-we will send ROI - foot exam: sees Podiatry ([facility] [place]) - microalbumin: May need, but will review records from last PCP, not on ACE inhibitor - she has issues with hypoglycemia when on insulin in the past. - denies symptoms of  polyuria, polydipsia, numbness extremities, foot ulcers/trauma   Morbid  obesity: Patient states that she wants to lose weight and she has gained weight over the year with her multiple surgeries.  She is discouraged by this.  She knows that her obesity is contributing to some of her medical problems.  She finds it difficult when her grandchildren leave sweets and snacks in the house.  GERD: Fairly well controlled.  Taking Protonix 40 mg daily with good compliance.  No history of PUD or GI bleed. -----------------------------------------------------------------   Review of Systems  Constitutional: Positive for fatigue.  HENT: Negative.   Eyes: Negative.   Respiratory: Negative.   Cardiovascular: Negative.   Gastrointestinal: Negative.   Endocrine: Negative.   Genitourinary: Negative.   Musculoskeletal: Positive for back pain.  Skin: Negative.   Allergic/Immunologic: Negative.   Neurological: Negative.   Hematological: Negative.   Psychiatric/Behavioral: The patient is nervous/anxious.     Social History      She  reports that she has never smoked. She has never used smokeless tobacco. She reports that she does not drink alcohol or use drugs.       Social History   Socioeconomic History  . Marital status: Divorced    Spouse name: Not on file  . Number of children: 3  . Years of education: Not on file  . Highest education level: Not on file  Occupational History  . Not on file  Tobacco Use  . Smoking status: Never Smoker  . Smokeless tobacco: Never Used  Substance and Sexual Activity  . Alcohol use: No  . Drug use: No  . Sexual activity: Yes    Partners: Male    Birth control/protection: Surgical  Other Topics Concern  . Not on file  Social History Narrative  . Not on file   Social Determinants of Health   Financial Resource Strain:   . Difficulty of Paying Living Expenses: Not on file  Food Insecurity:   . Worried About Charity fundraiser in the Last Year: Not on file  . Ran Out of Food in the Last Year: Not on file  Transportation  Needs:   . Lack of Transportation (Medical): Not on file  . Lack of Transportation (Non-Medical): Not on file  Physical Activity:   . Days of Exercise per Week: Not on file  . Minutes of Exercise per Session: Not on file  Stress:   . Feeling of Stress : Not on file  Social Connections:   . Frequency of Communication with Friends and Family: Not on file  . Frequency of Social Gatherings with Friends and Family: Not on file  . Attends Religious Services: Not on file  . Active Member of Clubs or Organizations: Not on file  . Attends Archivist Meetings: Not on file  . Marital Status: Not on file    Past Medical History:  Diagnosis Date  . Anxiety   . Bipolar 1 disorder ([place])   . Bipolar  disorder ([place])   . Chronic kidney disease    Stage 3 kidney disease;dx by Dr. [facility].   . Constipation   . Depression   . Diabetes mellitus without complication ([place])   . Dyspnea    with exertion  . GERD (gastroesophageal reflux disease)   . Headache    migraines  . High cholesterol   . History of blood clots   . History of DVT (deep vein thrombosis)    left leg  . Hypertension    states under control with meds., has been on med. x 2 years  . Hypothyroidism   . Neuropathy   . Obsessive-compulsive disorder   . Peripheral vascular disease ([place])   . Restless leg syndrome   . Trigger thumb of left hand 01/2018  . Trigger thumb of right hand      Patient Active Problem List   Diagnosis Date Noted  . Normocytic anemia 09/15/2019  . Thrombocytopenia ([place]) 09/15/2019  . Wound of left groin 04/27/2019  . Pressure injury of skin 04/02/2019  . Respiratory failure requiring intubation ([place])   . Dvt femoral (deep venous thrombosis) ([place]) 03/30/2019  . 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 Chronic venous insufficiency 11/21/2017  . Radicular low back pain 05/25/2014  . Depression, major, recurrent ([place]) 06/17/2013  . OCD (obsessive  compulsive disorder) 11/20/2012  . 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 Bipolar 1 disorder ([place]) 09/25/2012  . DVT (deep venous thrombosis) ([place]) 08/18/2012    Past Surgical History:  Procedure Laterality Date  . ABDOMINAL HYSTERECTOMY  06/2016   complete  . APPLICATION OF WOUND VAC Left 04/27/2019   Procedure: APPLICATION OF WOUND VAC LEFT GROIN;  Surgeon: Waynetta Sandy, MD;  Location: [facility];  Service: Vascular;  Laterality: Left;  . AV FISTULA PLACEMENT Left 11/24/2018   Procedure: ARTERIOVENOUS (AV) FISTULA CREATION LEFT SFA TO LEFT FEMORAL VEIN;  Surgeon: Waynetta Sandy, MD;  Location: [facility];  Service: Vascular;  Laterality: Left;  . BACK SURGERY    . CHOLECYSTECTOMY    . FEMORAL ARTERY EXPLORATION  03/30/2019   Procedure: Left Common Femoral Artery and Vein Exploration;  Surgeon: Waynetta Sandy, MD;  Location: [facility];  Service: Vascular;;  . FEMORAL-FEMORAL BYPASS GRAFT Left 11/24/2018   Procedure: BYPASS GRAFT FEMORAL-FEMORAL VENOUS LEFT TO RIGHT PALMA PROCEDURE USING CRYOVEIN;  Surgeon: Waynetta Sandy, MD;  Location: [facility];  Service: Vascular;  Laterality: Left;  . GROIN DEBRIDEMENT Left 04/27/2019   Procedure: GROIN DEBRIDEMENT;  Surgeon: Waynetta Sandy, MD;  Location: [facility];  Service: Vascular;  Laterality: Left;  . INSERTION OF ILIAC STENT  03/30/2019   Procedure: Stent of left common, external iliac veins and left common femoral vein;  Surgeon: Waynetta Sandy, MD;  Location: [facility];  Service: Vascular;;  . LOWER EXTREMITY VENOGRAPHY N/A 08/17/2018   Procedure: LOWER EXTREMITY VENOGRAPHY - Central Venogram;  Surgeon: Waynetta Sandy, MD;  Location: [facility];  Service: Cardiovascular;  Laterality: N/A;  . LOWER EXTREMITY VENOGRAPHY Bilateral 03/09/2019   Procedure: LOWER EXTREMITY VENOGRAPHY;  Surgeon: Waynetta Sandy, MD;  Location: [facility];  Service: Cardiovascular;   Laterality: Bilateral;  . LOWER EXTREMITY VENOGRAPHY Left 08/16/2019   Procedure: LOWER EXTREMITY VENOGRAPHY;  Surgeon: Waynetta Sandy, MD;  Location: [facility];  Service: Cardiovascular;  Laterality: Left;  . LUMBAR FUSION  11/21/2000   L5-S1  . LUMBAR SPINE SURGERY  x 2 others  . PATCH ANGIOPLASTY Left 03/30/2019   Procedure: Patch Angioplasty of the Left Common Femoral Vein using Venosure Biologic patch;  Surgeon: Waynetta Sandy, MD;  Location: [facility];  Service: Vascular;  Laterality: Left;  . PERIPHERAL VASCULAR INTERVENTION Left 08/16/2019   Procedure: PERIPHERAL VASCULAR INTERVENTION;  Surgeon: Waynetta Sandy, MD;  Location: [facility];  Service: Cardiovascular;  Laterality: Left;  common femoral/femoral vein stent  . thumb surgey    . TRIGGER FINGER RELEASE Right 12/01/2017   Procedure: RELEASE TRIGGER FINGER/A-1 PULLEY RIGHT THUMB;  Surgeon: Leanora Cover, MD;  Location: [facility];  Service: Orthopedics;  Laterality: Right;  . TRIGGER FINGER RELEASE Left 01/26/2018   Procedure: LEFT TRIGGER THUMB RELEASE;  Surgeon: Leanora Cover, MD;  Location: [facility];  Service: Orthopedics;  Laterality: Left;  . ULTRASOUND GUIDANCE FOR VASCULAR ACCESS Right 03/30/2019   Procedure: Ultrasound-guided cannulation right internal jugular vein;  Surgeon: Waynetta Sandy, MD;  Location: [facility];  Service: Vascular;  Laterality: Right;    Family History        Family Status  Relation Name Status  . Mother  Deceased  . Father  Deceased  . Daughter Amy Ernesto Rutherford  . Daughter Landscape architect  . Daughter Wardell Honour  . MGF  Deceased  . MGM  Deceased  . PGF  Deceased  . PGM  Deceased  . Sister  (Not Specified)  . Brother  (Not Specified)  . Mat Aunt  (Not Specified)        Her family history includes ADD / ADHD in her daughter; Anxiety disorder in her daughter; Bipolar disorder in her daughter, maternal aunt, and  mother; Diabetes in her father; Drug abuse in her daughter and daughter; Heart disease in her brother, father, and mother; Hyperlipidemia in her brother, father, and mother; Hypertension in her brother, father, mother, and sister; Suicidality in her maternal aunt.      Allergies  Allergen Reactions  . Aripiprazole Other (See Comments)    BECOMES  VIOLENT   . Seroquel [Quetiapine Fumarate] Other (See Comments)    BECOMES VIOLENT  . Chlorpromazine Other (See Comments)    SEVERE ANXIETY   . Gabapentin Other (See Comments)    NIGHTMARES      Current Outpatient Medications:  .  ALPRAZolam (XANAX) 1 MG tablet, Take 1 tablet (1 mg total) by mouth 2 (two) times daily as needed for anxiety., Disp: 60 tablet, Rfl: 2 .  amphetamine-dextroamphetamine (ADDERALL) 30 MG tablet, Take 1 tablet by mouth 2 (two) times daily., Disp: 60 tablet, Rfl: 0 .  aspirin EC 81 MG tablet, Take 81 mg by mouth daily., Disp: , Rfl:  .  atorvastatin (LIPITOR) 20 MG tablet, Take 20 mg by mouth daily., Disp: , Rfl:  .  cetirizine (ZYRTEC) 10 MG tablet, Take 10 mg by mouth daily., Disp: , Rfl:  .  cycloSPORINE (RESTASIS) 0.05 % ophthalmic emulsion, Place 1 drop into both eyes 2 (two) times daily., Disp: , Rfl:  .  DULoxetine (CYMBALTA) 60 MG capsule, Take 1 capsule (60 mg total) by mouth 2 (two) times daily., Disp: 60 capsule, Rfl: 2 .  glipiZIDE (GLUCOTROL XL) 5 MG 24 hr tablet, Take 5 mg by mouth daily with breakfast., Disp: , Rfl:  .  hydrochlorothiazide (HYDRODIURIL) 25 MG tablet, Take 25 mg by mouth daily., Disp: , Rfl:  .  HYDROcodone-acetaminophen (NORCO) 10-325 MG tablet, Take 1 tablet by mouth every 4 (four)  hours as needed., Disp: , Rfl:  .  levothyroxine (SYNTHROID) 150 MCG tablet, Take 150 mcg by mouth daily before breakfast. , Disp: , Rfl:  .  linaclotide (LINZESS) 145 MCG CAPS capsule, Take 145 mcg by mouth daily. , Disp: , Rfl:  .  Magnesium 250 MG TABS, Take 250 mg by mouth daily., Disp: , Rfl:  .   Multiple Vitamin (MULTIVITAMIN WITH MINERALS) TABS tablet, Take 1 tablet by mouth daily. One-A-Day Women's Multivitamin, Disp: , Rfl:  .  Omega-3 Fatty Acids (FISH OIL) 1000 MG CAPS, Take 1,000 mg by mouth daily. , Disp: , Rfl:  .  pantoprazole (PROTONIX) 40 MG tablet, Take 40 mg by mouth daily., Disp: , Rfl:  .  PAZEO 0.7 % SOLN, Place 1 drop into both eyes daily. , Disp: , Rfl:  .  Polyvinyl Alcohol-Povidone (REFRESH OP), Place 1 drop into both eyes 4 (four) times daily. , Disp: , Rfl:  .  potassium chloride (K-DUR) 10 MEQ tablet, Take 10 mEq by mouth 3 (three) times daily., Disp: , Rfl:  .  rOPINIRole (REQUIP) 2 MG tablet, Take 2 mg by mouth 2 (two) times daily., Disp: , Rfl:  .  vitamin C (ASCORBIC ACID) 500 MG tablet, Take 500 mg by mouth at bedtime., Disp: , Rfl:  .  warfarin (COUMADIN) 5 MG tablet, Take 1 tablet (5 mg total) by mouth daily at 6 PM., Disp: 30 tablet, Rfl: 6   Patient Care Team: [name], MD as PCP - General (Family Medicine)    Objective:    Vitals: BP 138/76 (BP Location: Left Arm, Patient Position: Sitting, Cuff Size: Large)   Pulse 66   Temp 97.6 F (36.4 C) (Temporal)   Resp 16   Ht 5\' 10"  (1.778 m)   Wt 282 lb (127.9 kg)   BMI 40.46 kg/m    Vitals:   10/25/19 1336  BP: 138/76  Pulse: 66  Resp: 16  Temp: 97.6 F (36.4 C)  TempSrc: Temporal  Weight: 282 lb (127.9 kg)  Height: 5\' 10"  (1.778 m)     Physical Exam Vitals reviewed.  Constitutional:      General: She is not in acute distress.    Appearance: Normal appearance. She is well-developed. She is obese. She is not diaphoretic.  HENT:     Head: Normocephalic and atraumatic.     Right Ear: Tympanic membrane, ear canal and external ear normal.     Left Ear: Tympanic membrane, ear canal and external ear normal.  Eyes:     General: No scleral icterus.    Conjunctiva/sclera: Conjunctivae normal.     Pupils: Pupils are equal, round, and reactive to light.  Neck:     Thyroid: No  thyromegaly.  Cardiovascular:     Rate and Rhythm: Normal rate and regular rhythm.     Pulses: Normal pulses.     Heart sounds: Normal heart sounds. No murmur.  Pulmonary:     Effort: Pulmonary effort is normal. No respiratory distress.     Breath sounds: Normal breath sounds. No wheezing or rales.  Abdominal:     General: There is no distension.     Palpations: Abdomen is soft.     Tenderness: There is no abdominal tenderness. There is no guarding or rebound.  Musculoskeletal:        General: No deformity.     Cervical back: Neck supple.     Comments: Trace bilateral lower extremity edema.  No notable calf asymmetry Back:  Diffuse tenderness to palpation over the lumbar spine and SI joints bilaterally.  Negative straight leg raise pack.  Gait is antalgic, but no appreciable weakness of lower extremities  Lymphadenopathy:     Cervical: No cervical adenopathy.  Skin:    General: Skin is warm and dry.     Capillary Refill: Capillary refill takes less than 2 seconds.     Findings: No rash.  Neurological:     Mental Status: She is alert and oriented to person, place, and time. Mental status is at baseline.  Psychiatric:        Mood and Affect: Mood normal.        Behavior: Behavior normal.        Thought Content: Thought content normal.      Depression Screen PHQ 2/9 Scores 10/25/2019 09/22/2014  PHQ - 2 Score 4 4  PHQ- 9 Score 19 15       Assessment & Plan:     Establish care  Exercise Activities and Dietary recommendations Goals   None      There is no immunization history on file for this patient.  Health Maintenance  Topic Date Due  . TETANUS/TDAP  07/31/1985  . PAP SMEAR-Modifier  08/01/1987  . MAMMOGRAM  07/31/2016  . COLONOSCOPY  07/31/2016  . INFLUENZA VACCINE  08/23/2020 (Originally 06/05/2019)  . HIV Screening  Completed     Discussed health benefits of physical activity, and encouraged her to engage in regular exercise appropriate for her age and  condition.    Records from previous PCP reviewed at length See plan for each thing that was reviewed according to problem Last mammogram from 2019 reviewed and will be abstracted into the chart --------------------------------------------------------------------  Problem List Items Addressed This Visit      Cardiovascular and Mediastinum   May-Thurner syndrome    Followed by vascular surgery Continue Coumadin      Relevant Orders   INR/PT   Essential hypertension    Well controlled Continue current medications Recheck metabolic panel F/u in 3-6 months       Relevant Orders   CMP (Comprehensive metabolic panel)     Respiratory   Allergic rhinitis    Chronic and stable Continue Zyrtec as needed        Digestive   GERD (gastroesophageal reflux disease)    Chronic and stable Continue PPI      Chronic constipation    Likely related to chronic opioid use Continue Linzess Screening colonoscopy        Endocrine   Hypothyroidism - Primary    Previously uncontrolled Record review from her previous PCP shows last TSH on 06/29/2019 was 7.92. Repeat TSH Continue current dose of Synthroid pending results      Relevant Orders   TSH   Type 2 diabetes mellitus with stage 3 chronic kidney disease, without long-term current use of insulin ([place])    Record review from previous PCP shows that her diabetes is uncontrolled with last [name] 7.5 on 11/18/2018 We will continue glipizide at current dose Repeat [name] May need dose titrations or other medications May benefit from a  GLP-1 Would need to consider her GFR before trying an SGLT2 We will send ROI to ophthalmology and podiatry for last foot and eye exam Follow-up in 3 months May need a urine microalbumin at next visit On statin      Relevant Orders   Hemoglobin [name]   Hyperlipidemia associated with type 2 diabetes mellitus ([place])  Continue atorvastatin 20 mg daily and baby aspirin daily Recheck CMP and FLP Reviewed  last lipid panel in her records from 08/2017 that showed LDL of 108 Goal LDL less than 70 in the setting of diabetes      Relevant Orders   CMP (Comprehensive metabolic panel)   Lipid panel   CKD stage 3 due to type 2 diabetes mellitus ([place])    Patient with CKD 3 with last creatinine 1.32 with a GFR of 46 in 06/2019 per record review from previous PCP Recheck metabolic panel Avoid NSAIDs and other nephrotoxic medications Important to keep diabetes and blood pressure well controlled        Nervous and Auditory   Peripheral neuropathy    Related to multiple back surgeries and T2DM Reports allergic reaction to gabapentin, but seems to be taking Lyrica regularly per podiatry Last foot exam requested from podiatry      Chronic bilateral low back pain with bilateral sciatica    Chronic with some recent worsening Has had multiple back surgeries previously Has been on Norco for many years Patient would benefit from regular exercise and possibly from interventional pain management Referral placed to pain management in 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)    Followed by behavioral health in [place] No changes to medications today      Bipolar 1 disorder (HCC) (Chronic)    Followed by behavioral health in [place] No changes to medications today      OCD (obsessive compulsive disorder) (Chronic)    History of recurrent deep vein thrombosis (DVT)    Followed by vascular surgery Related to may Thurner syndrome as above On lifelong anticoagulation with warfarin No changes to warfarin dose today Recheck INR We will set her up on the INR schedule for follow-up in 4 weeks      Relevant Orders   INR/PT   Depression, major, recurrent ([place])    Followed by behavioral health in [place] No changes to medications today      Normocytic anemia    Followed by hematology She has undergone Feraheme infusions x2 She has history of blood transfusions during hospitalization She is getting following up with hematology in about 3 months We will get her screening colonoscopy in the meantime Reviewed last CBC and iron panel Discussed with patient no need to recheck those at this time      Morbid obesity [facility])    Discussed importance of healthy weight management Discussed diet and exercise Encouraged walking regularly, as this may help with her back and joint pain as well May be able to consider gastric bypass or other bariatric surgery in the future, but given all of her recent surgeries, she is not a good candidate at this time      History of back surgery    Patient wishes to continue to follow with neurosurgery, so new referral was placed today As above, she has chronic  back pain, so she was also referred to pain management Exam is benign today      Relevant Orders   Ambulatory referral to Neurosurgery   Ambulatory referral to Pain Clinic   Restless leg syndrome    Chronic and stable Continue Requip Reportedly predates anemia, but Feraheme may help this as well      Chronic anticoagulation    As above, patient with recurrent DVTs and now on lifelong anticoagulation with warfarin She failed a DOAC in the past Recheck INR today Set her up for the Coumadin clinic schedule in 1 month Continue current dose of Coumadin, which is 5 mg daily, except 7-1/2 mg on Tuesdays and  Thursdays      Relevant Orders   INR/PT    Other Visit Diagnoses    Screen for colon cancer       Relevant Orders   Ambulatory referral to Gastroenterology      Addressed extensive list of chronic and acute medical problems today requiring extensive time in counseling and coordination of care.  Over half of this 60 minute visit were spent in counseling and coordinating care of multiple medical problems.   Return in about 3 months (around 01/23/2020) for CPE.   The entirety of the information documented in the History of Present Illness, Review of Systems and Physical Exam were personally obtained by me. Portions of this information were initially documented by Lynford Humphrey, CMA and reviewed by me for thoroughness and accuracy.    Zayley Arras, Dionne Bucy, MD MPH [facility]

## 2019-10-25 NOTE — Assessment & Plan Note (Signed)
Patient wishes to continue to follow with neurosurgery, so new referral was placed today As above, she has chronic back pain, so she was also referred to pain management Exam is benign today

## 2019-10-25 NOTE — Assessment & Plan Note (Signed)
Followed by vascular surgery Continue Coumadin

## 2019-10-26 ENCOUNTER — Encounter: Payer: Self-pay | Admitting: Family Medicine

## 2019-10-26 LAB — COMPREHENSIVE METABOLIC PANEL
ALT: 17 IU/L (ref 0–32)
AST: 14 IU/L (ref 0–40)
Albumin/Globulin Ratio: 1.7 (ref 1.2–2.2)
Albumin: 4.4 g/dL (ref 3.8–4.9)
Alkaline Phosphatase: 154 IU/L — ABNORMAL HIGH (ref 39–117)
BUN/Creatinine Ratio: 25 — ABNORMAL HIGH (ref 9–23)
BUN: 28 mg/dL — ABNORMAL HIGH (ref 6–24)
Bilirubin Total: 0.3 mg/dL (ref 0.0–1.2)
CO2: 28 mmol/L (ref 20–29)
Calcium: 9.5 mg/dL (ref 8.7–10.2)
Chloride: 100 mmol/L (ref 96–106)
Creatinine, Ser: 1.12 mg/dL — ABNORMAL HIGH (ref 0.57–1.00)
GFR calc Af Amer: 65 mL/min/{1.73_m2} (ref >59)
GFR calc non Af Amer: 56 mL/min/{1.73_m2} — ABNORMAL LOW (ref >59)
Globulin, Total: 2.6 g/dL (ref 1.5–4.5)
Glucose: 82 mg/dL (ref 65–99)
Potassium: 3.8 mmol/L (ref 3.5–5.2)
Sodium: 142 mmol/L (ref 134–144)
Total Protein: 7 g/dL (ref 6.0–8.5)

## 2019-10-26 LAB — LIPID PANEL
Chol/HDL Ratio: 5 ratio — ABNORMAL HIGH (ref 0.0–4.4)
Cholesterol, Total: 210 mg/dL — ABNORMAL HIGH (ref 100–199)
HDL: 42 mg/dL (ref >39)
LDL Chol Calc (NIH): 127 mg/dL — ABNORMAL HIGH (ref 0–99)
Triglycerides: 234 mg/dL — ABNORMAL HIGH (ref 0–149)
VLDL Cholesterol Cal: 41 mg/dL — ABNORMAL HIGH (ref 5–40)

## 2019-10-26 LAB — TSH: TSH: 1.26 u[IU]/mL (ref 0.450–4.500)

## 2019-10-26 LAB — PROTIME-INR
INR: 2.6 — ABNORMAL HIGH (ref 0.9–1.2)
Prothrombin Time: 26.5 s — ABNORMAL HIGH (ref 9.1–12.0)

## 2019-10-26 LAB — HEMOGLOBIN A1C
Est. average glucose Bld gHb Est-mCnc: 134 mg/dL
Hgb A1c MFr Bld: 6.3 % — ABNORMAL HIGH (ref 4.8–5.6)

## 2019-11-04 ENCOUNTER — Other Ambulatory Visit (HOSPITAL_COMMUNITY): Payer: Self-pay | Admitting: Psychiatry

## 2019-11-08 ENCOUNTER — Telehealth: Payer: Self-pay

## 2019-11-08 ENCOUNTER — Other Ambulatory Visit: Payer: Self-pay

## 2019-11-08 DIAGNOSIS — Z1211 Encounter for screening for malignant neoplasm of colon: Secondary | ICD-10-CM

## 2019-11-08 NOTE — Telephone Encounter (Signed)
Gastroenterology Pre-Procedure Review  Request Date: 11/22/19 Requesting Physician: Dr. Vicente Males  PATIENT REVIEW QUESTIONS: The patient responded to the following health history questions as indicated:    1. Are you having any GI issues? yes (constipation takes linzess) 2. Do you have a personal history of Polyps? no 3. Do you have a family history of Colon Cancer or Polyps? no 4. Diabetes Mellitus? yes (oral meds) 5. Joint replacements in the past 12 months?no 6. Major health problems in the past 3 months?no 7. Any artificial heart valves, MVP, or defibrillator?no    MEDICATIONS & ALLERGIES:    Patient reports the following regarding taking any anticoagulation/antiplatelet therapy:   Plavix, Coumadin, Eliquis, Xarelto, Lovenox, Pradaxa, Brilinta, or Effient? yes (Coumadin Blood Thinner Request being sent to [facility]) Aspirin? yes (81mg )  Patient confirms/reports the following medications:  Current Outpatient Medications  Medication Sig Dispense Refill  . ALPRAZolam (XANAX) 1 MG tablet Take 1 tablet (1 mg total) by mouth 2 (two) times daily as needed for anxiety. 60 tablet 2  . amphetamine-dextroamphetamine (ADDERALL) 30 MG tablet Take 1 tablet by mouth 2 (two) times daily. 60 tablet 0  . aspirin EC 81 MG tablet Take 81 mg by mouth daily.    Marland Kitchen atorvastatin (LIPITOR) 20 MG tablet Take 20 mg by mouth daily.    . cetirizine (ZYRTEC) 10 MG tablet Take 10 mg by mouth daily.    . cycloSPORINE (RESTASIS) 0.05 % ophthalmic emulsion Place 1 drop into both eyes 2 (two) times daily.    . DULoxetine (CYMBALTA) 60 MG capsule Take 1 capsule (60 mg total) by mouth 2 (two) times daily. 60 capsule 2  . glipiZIDE (GLUCOTROL XL) 5 MG 24 hr tablet Take 5 mg by mouth daily with breakfast.    . hydrochlorothiazide (HYDRODIURIL) 25 MG tablet Take 25 mg by mouth daily.    Marland Kitchen HYDROcodone-acetaminophen (NORCO) 10-325 MG tablet Take 1 tablet by mouth every 4 (four) hours as needed.    Marland Kitchen levothyroxine  (SYNTHROID) 150 MCG tablet Take 150 mcg by mouth daily before breakfast.     . linaclotide (LINZESS) 145 MCG CAPS capsule Take 145 mcg by mouth daily.     . Magnesium 250 MG TABS Take 250 mg by mouth daily.    . Multiple Vitamin (MULTIVITAMIN WITH MINERALS) TABS tablet Take 1 tablet by mouth daily. One-A-Day Women's Multivitamin    . Omega-3 Fatty Acids (FISH OIL) 1000 MG CAPS Take 1,000 mg by mouth daily.     . pantoprazole (PROTONIX) 40 MG tablet Take 40 mg by mouth daily.    Marland Kitchen PAZEO 0.7 % SOLN Place 1 drop into both eyes daily.     . Polyvinyl Alcohol-Povidone (REFRESH OP) Place 1 drop into both eyes 4 (four) times daily.     . potassium chloride (K-DUR) 10 MEQ tablet Take 10 mEq by mouth 3 (three) times daily.    Marland Kitchen rOPINIRole (REQUIP) 2 MG tablet Take 2 mg by mouth 2 (two) times daily.    . vitamin C (ASCORBIC ACID) 500 MG tablet Take 500 mg by mouth at bedtime.    Marland Kitchen warfarin (COUMADIN) 5 MG tablet Take 1 tablet (5 mg total) by mouth daily at 6 PM. 30 tablet 6   No current facility-administered medications for this visit.    Patient confirms/reports the following allergies:  Allergies  Allergen Reactions  . Aripiprazole Other (See Comments)    BECOMES  VIOLENT   . Seroquel [Quetiapine Fumarate] Other (See Comments)  BECOMES VIOLENT  . Chlorpromazine Other (See Comments)    SEVERE ANXIETY   . Gabapentin Other (See Comments)    NIGHTMARES     No orders of the defined types were placed in this encounter.   AUTHORIZATION INFORMATION Primary Insurance: 1D#: Group #:  Secondary Insurance: 1D#: Group #:  SCHEDULE INFORMATION: Date: 11/22/19 Time: Location:[facility]

## 2019-11-10 ENCOUNTER — Telehealth: Payer: Self-pay

## 2019-11-10 ENCOUNTER — Ambulatory Visit (INDEPENDENT_AMBULATORY_CARE_PROVIDER_SITE_OTHER): Payer: Medicaid Other | Admitting: Psychiatry

## 2019-11-10 ENCOUNTER — Other Ambulatory Visit: Payer: Self-pay

## 2019-11-10 ENCOUNTER — Other Ambulatory Visit: Payer: Self-pay | Admitting: Family Medicine

## 2019-11-10 ENCOUNTER — Encounter (HOSPITAL_COMMUNITY): Payer: Self-pay | Admitting: Psychiatry

## 2019-11-10 DIAGNOSIS — F3130 Bipolar disorder, current episode depressed, mild or moderate severity, unspecified: Secondary | ICD-10-CM | POA: Diagnosis not present

## 2019-11-10 MED ORDER — AMPHETAMINE-DEXTROAMPHETAMINE 30 MG PO TABS: 30.0000 mg | ORAL_TABLET | Freq: Two times a day (BID) | ORAL | 0 refills | Status: DC

## 2019-11-10 MED ORDER — ENOXAPARIN SODIUM 120 MG/0.8ML ~~LOC~~ SOLN: 120.0000 mg | Freq: Two times a day (BID) | SUBCUTANEOUS | 0 refills | Status: DC

## 2019-11-10 MED ORDER — DULOXETINE HCL 60 MG PO CPEP: 60.0000 mg | ORAL_CAPSULE | Freq: Two times a day (BID) | ORAL | 2 refills | Status: DC

## 2019-11-10 MED ORDER — ALPRAZOLAM 1 MG PO TABS: 1.0000 mg | ORAL_TABLET | Freq: Two times a day (BID) | ORAL | 2 refills | Status: DC | PRN

## 2019-11-10 NOTE — Telephone Encounter (Signed)
LVM for pt to call office back to provide blood thinner advice.  Thanks [employer]

## 2019-11-10 NOTE — Progress Notes (Signed)
Virtual Visit via Telephone Note  I connected with Jane [name] on 11/10/19 at 11:00 AM EST by telephone and verified that I am speaking with the correct person using two identifiers.   I discussed the limitations, risks, security and privacy concerns of performing an evaluation and management service by telephone and the availability of in person appointments. I also discussed with the patient that there may be a patient responsible charge related to this service. The patient expressed understanding and agreed to proceed.     I discussed the assessment and treatment plan with the patient. The patient was provided an opportunity to ask questions and all were answered. The patient agreed with the plan and demonstrated an understanding of the instructions.   The patient was advised to call back or seek an in-person evaluation if the symptoms worsen or if the condition fails to improve as anticipated.  I provided 15 minutes of non-face-to-face time during this encounter.   [name], MD  [facility] MD/PA/NP OP Progress Note  11/10/2019 11:27 AM Jane [name]  MRN:  [id number]  Chief Complaint:  Chief Complaint    Depression; Anxiety; ADD; Follow-up     HPI: This patient is a 54year-old divorced white female who lives alone in [place]. She is on disability due to bipolar disorder.  The patient states that she became very depressed when her mother died in 02/18/00 of a brain aneurysm. She started getting. Sad but eventually became manic and develop mood swings and agitated behavior. She's been on numerous medications over the years. In 1988-02-18 she also had postpartum depression. For many years she was on lithium but it caused a bad taste in her mouth and her doctor was worried it might be starting to affect her renal function so was discontinued. She also thinks that it stopped working after a number of years  The patient returns for follow-up after 3 months.  She states that she still having  difficulties with her legs and feet and its made it difficult for her to get out much.  She is a little depressed because she feels very isolated and this has not been helped by the coronavirus pandemic.  She is also having conflicts with one of her daughters and they almost came to blows that her recent family gathering and she is now trying to not have any contact with his daughter.  Overall however she states her medications have helped with her depression and anxiety and she is not had any manic symptoms.  The Adderall continues to help with her focus. Visit Diagnosis:    ICD-10-CM   1. Bipolar I disorder, most recent episode depressed ([place])  F31.30     Past Psychiatric History: Long-term outpatient treatment  Past Medical History:  Past Medical History:  Diagnosis Date  . Anxiety   . Bipolar 1 disorder ([place])   . Bipolar disorder ([place])   . Chronic kidney disease    Stage 3 kidney disease;dx by Dr. Sinda Du.   . Constipation   . Depression   . Diabetes mellitus without complication ([place])   . Dyspnea    with exertion  . GERD (gastroesophageal reflux disease)   . Headache    migraines  . High cholesterol   . History of blood clots   . History of DVT (deep vein thrombosis)    left leg  . Hypertension    states under control with meds., has been on med. x 2 years  . Hypothyroidism   . Neuropathy   .  Obsessive-compulsive disorder   . Peripheral vascular disease ([place])   . Respiratory failure requiring intubation ([place])   . Restless leg syndrome   . Thrombocytopenia ([place]) 09/15/2019  . Trigger thumb of left hand 01/2018  . Trigger thumb of right hand     Past Surgical History:  Procedure Laterality Date  . ABDOMINAL HYSTERECTOMY  06/2016   complete  . APPLICATION OF WOUND VAC Left 04/27/2019   Procedure: APPLICATION OF WOUND VAC LEFT GROIN;  Surgeon: Waynetta Sandy, MD;  Location: [place];  Service: Vascular;  Laterality: Left;  . AV FISTULA PLACEMENT Left  11/24/2018   Procedure: ARTERIOVENOUS (AV) FISTULA CREATION LEFT SFA TO LEFT FEMORAL VEIN;  Surgeon: Waynetta Sandy, MD;  Location: [place];  Service: Vascular;  Laterality: Left;  . CHOLECYSTECTOMY    . FEMORAL ARTERY EXPLORATION  03/30/2019   Procedure: Left Common Femoral Artery and Vein Exploration;  Surgeon: Waynetta Sandy, MD;  Location: [place];  Service: Vascular;;  . FEMORAL-FEMORAL BYPASS GRAFT Left 11/24/2018   Procedure: BYPASS GRAFT FEMORAL-FEMORAL VENOUS LEFT TO RIGHT PALMA PROCEDURE USING CRYOVEIN;  Surgeon: Waynetta Sandy, MD;  Location: [place];  Service: Vascular;  Laterality: Left;  . GROIN DEBRIDEMENT Left 04/27/2019   Procedure: GROIN DEBRIDEMENT;  Surgeon: Waynetta Sandy, MD;  Location: [place];  Service: Vascular;  Laterality: Left;  . INSERTION OF ILIAC STENT  03/30/2019   Procedure: Stent of left common, external iliac veins and left common femoral vein;  Surgeon: Waynetta Sandy, MD;  Location: [place];  Service: Vascular;;  . LOWER EXTREMITY VENOGRAPHY N/A 08/17/2018   Procedure: LOWER EXTREMITY VENOGRAPHY - Central Venogram;  Surgeon: Waynetta Sandy, MD;  Location: [place] [facility];  Service: Cardiovascular;  Laterality: N/A;  . LOWER EXTREMITY VENOGRAPHY Bilateral 03/09/2019   Procedure: LOWER EXTREMITY VENOGRAPHY;  Surgeon: Waynetta Sandy, MD;  Location: [place] [facility];  Service: Cardiovascular;  Laterality: Bilateral;  . LOWER EXTREMITY VENOGRAPHY Left 08/16/2019   Procedure: LOWER EXTREMITY VENOGRAPHY;  Surgeon: Waynetta Sandy, MD;  Location: [place] [facility];  Service: Cardiovascular;  Laterality: Left;  . LUMBAR FUSION  11/21/2000   L5-S1  . LUMBAR SPINE SURGERY     x 2 others  . PATCH ANGIOPLASTY Left 03/30/2019   Procedure: Patch Angioplasty of the Left Common Femoral Vein using Venosure Biologic patch;  Surgeon: Waynetta Sandy, MD;  Location: [place];  Service: Vascular;   Laterality: Left;  . PERIPHERAL VASCULAR INTERVENTION Left 08/16/2019   Procedure: PERIPHERAL VASCULAR INTERVENTION;  Surgeon: Waynetta Sandy, MD;  Location: [place] [facility];  Service: Cardiovascular;  Laterality: Left;  common femoral/femoral vein stent  . TRIGGER FINGER RELEASE Right 12/01/2017   Procedure: RELEASE TRIGGER FINGER/A-1 PULLEY RIGHT THUMB;  Surgeon: Leanora Cover, MD;  Location: [place];  Service: Orthopedics;  Laterality: Right;  . TRIGGER FINGER RELEASE Left 01/26/2018   Procedure: LEFT TRIGGER THUMB RELEASE;  Surgeon: Leanora Cover, MD;  Location: [place];  Service: Orthopedics;  Laterality: Left;  . ULTRASOUND GUIDANCE FOR VASCULAR ACCESS Right 03/30/2019   Procedure: Ultrasound-guided cannulation right internal jugular vein;  Surgeon: Waynetta Sandy, MD;  Location: [facility];  Service: Vascular;  Laterality: Right;    Family Psychiatric History: see below  Family History:  Family History  Problem Relation Age of Onset  . Heart disease Mother   . Hyperlipidemia Mother   . Hypertension Mother   . Bipolar disorder Mother   .  Diabetes Father   . Heart disease Father   . Hyperlipidemia Father   . Hypertension Father   . Drug abuse Daughter   . ADD / ADHD Daughter   . Drug abuse Daughter   . Anxiety disorder Daughter   . Bipolar disorder Daughter   . Hypertension Sister   . Hypertension Brother   . Hyperlipidemia Brother   . Heart disease Brother   . Bipolar disorder Maternal Aunt   . Suicidality Maternal Aunt     Social History:  Social History   Socioeconomic History  . Marital status: Divorced    Spouse name: Not on file  . Number of children: 3  . Years of education: Not on file  . Highest education level: Not on file  Occupational History  . Not on file  Tobacco Use  . Smoking status: Never Smoker  . Smokeless tobacco: Never Used  Substance and Sexual Activity  . Alcohol use: No  . Drug use:  No  . Sexual activity: Not Currently    Partners: Male    Birth control/protection: Surgical  Other Topics Concern  . Not on file  Social History Narrative  . Not on file   Social Determinants of Health   Financial Resource Strain:   . Difficulty of Paying Living Expenses: Not on file  Food Insecurity:   . Worried About Charity fundraiser in the Last Year: Not on file  . Ran Out of Food in the Last Year: Not on file  Transportation Needs:   . Lack of Transportation (Medical): Not on file  . Lack of Transportation (Non-Medical): Not on file  Physical Activity:   . Days of Exercise per Week: Not on file  . Minutes of Exercise per Session: Not on file  Stress:   . Feeling of Stress : Not on file  Social Connections:   . Frequency of Communication with Friends and Family: Not on file  . Frequency of Social Gatherings with Friends and Family: Not on file  . Attends Religious Services: Not on file  . Active Member of Clubs or Organizations: Not on file  . Attends Archivist Meetings: Not on file  . Marital Status: Not on file    Allergies:  Allergies  Allergen Reactions  . Aripiprazole Other (See Comments)    BECOMES  VIOLENT   . Seroquel [Quetiapine Fumarate] Other (See Comments)    BECOMES VIOLENT  . Chlorpromazine Other (See Comments)    SEVERE ANXIETY   . Gabapentin Other (See Comments)    NIGHTMARES     Metabolic Disorder Labs: Lab Results  Component Value Date   HGBA1C 6.3 (H) 10/25/2019   MPG 134.11 03/26/2019   No results found for: PROLACTIN Lab Results  Component Value Date   CHOL 210 (H) 10/25/2019   TRIG 234 (H) 10/25/2019   HDL 42 10/25/2019   CHOLHDL 5.0 (H) 10/25/2019   LDLCALC 127 (H) 10/25/2019   Lab Results  Component Value Date   TSH 1.260 10/25/2019    Therapeutic Level Labs: No results found for: LITHIUM Lab Results  Component Value Date   VALPROATE 61.2 03/23/2014   No components found for:  CBMZ  Current  Medications: Current Outpatient Medications  Medication Sig Dispense Refill  . ALPRAZolam (XANAX) 1 MG tablet Take 1 tablet (1 mg total) by mouth 2 (two) times daily as needed for anxiety. 60 tablet 2  . amphetamine-dextroamphetamine (ADDERALL) 30 MG tablet Take 1 tablet by mouth 2 (two)  times daily. 60 tablet 0  . amphetamine-dextroamphetamine (ADDERALL) 30 MG tablet Take 1 tablet by mouth 2 (two) times daily. 60 tablet 0  . amphetamine-dextroamphetamine (ADDERALL) 30 MG tablet Take 1 tablet by mouth 2 (two) times daily. 60 tablet 0  . aspirin EC 81 MG tablet Take 81 mg by mouth daily.    Marland Kitchen atorvastatin (LIPITOR) 20 MG tablet Take 20 mg by mouth daily.    . cetirizine (ZYRTEC) 10 MG tablet Take 10 mg by mouth daily.    . cycloSPORINE (RESTASIS) 0.05 % ophthalmic emulsion Place 1 drop into both eyes 2 (two) times daily.    . DULoxetine (CYMBALTA) 60 MG capsule Take 1 capsule (60 mg total) by mouth 2 (two) times daily. 60 capsule 2  . enoxaparin (LOVENOX) 120 MG/0.8ML injection Inject 0.8 mLs (120 mg total) into the skin every 12 (twelve) hours for 5 days. 8 mL 0  . glipiZIDE (GLUCOTROL XL) 5 MG 24 hr tablet Take 5 mg by mouth daily with breakfast.    . hydrochlorothiazide (HYDRODIURIL) 25 MG tablet Take 25 mg by mouth daily.    Marland Kitchen HYDROcodone-acetaminophen (NORCO) 10-325 MG tablet Take 1 tablet by mouth every 4 (four) hours as needed.    Marland Kitchen levothyroxine (SYNTHROID) 150 MCG tablet Take 150 mcg by mouth daily before breakfast.     . linaclotide (LINZESS) 145 MCG CAPS capsule Take 145 mcg by mouth daily.     . Magnesium 250 MG TABS Take 250 mg by mouth daily.    . Multiple Vitamin (MULTIVITAMIN WITH MINERALS) TABS tablet Take 1 tablet by mouth daily. One-A-Day Women's Multivitamin    . Omega-3 Fatty Acids (FISH OIL) 1000 MG CAPS Take 1,000 mg by mouth daily.     . pantoprazole (PROTONIX) 40 MG tablet Take 40 mg by mouth daily.    Marland Kitchen PAZEO 0.7 % SOLN Place 1 drop into both eyes daily.     .  Polyvinyl Alcohol-Povidone (REFRESH OP) Place 1 drop into both eyes 4 (four) times daily.     . potassium chloride (K-DUR) 10 MEQ tablet Take 10 mEq by mouth 3 (three) times daily.    Marland Kitchen rOPINIRole (REQUIP) 2 MG tablet Take 2 mg by mouth 2 (two) times daily.    . vitamin C (ASCORBIC ACID) 500 MG tablet Take 500 mg by mouth at bedtime.    Marland Kitchen warfarin (COUMADIN) 5 MG tablet Take 1 tablet (5 mg total) by mouth daily at 6 PM. 30 tablet 6   No current facility-administered medications for this visit.     Musculoskeletal: Strength & Muscle Tone: within normal limits Gait & Station: normal Patient leans: N/A  Psychiatric Specialty Exam: Review of Systems  Musculoskeletal: Positive for arthralgias and myalgias.  All other systems reviewed and are negative.   There were no vitals taken for this visit.There is no height or weight on file to calculate BMI.  General Appearance: NA  Eye Contact:  NA  Speech:  Clear and Coherent  Volume:  Normal  Mood:  Euthymic and Irritable  Affect:  NA  Thought Process:  Goal Directed  Orientation:  Full (Time, Place, and Person)  Thought Content: Rumination   Suicidal Thoughts:  No  Homicidal Thoughts:  No  Memory:  Immediate;   Good Recent;   Good Remote;   Good  Judgement:  Fair  Insight:  Fair  Psychomotor Activity:  Decreased  Concentration:  Concentration: Good and Attention Span: Good  Recall:  Good  Fund of Knowledge:  Good  Language: Good  Akathisia:  No  Handed:  Right  AIMS (if indicated): not done  Assets:  Communication Skills Desire for Improvement Resilience Social Support Talents/Skills  ADL's:  Intact  Cognition: WNL  Sleep:  Good   Screenings: PHQ2-9     Office Visit from 10/25/2019 in [place] from [facility]/22/[facility]  PHQ-2 Total Score  4  4  PHQ-9 Total Score  19  15       Assessment and Plan: This patient is a 54 year old female with a history of  depression anxiety and ADD.  Although she is having conflicts with her daughter she feels that her mood is stable.  She will continue Xanax 1 mg twice daily as needed for anxiety, Cymbalta 60 mg twice daily for depression and Adderall 30 mg twice daily for ADD.  She will return to see me in 3 months   [name], MD 11/10/2019, 11:27 AM

## 2019-11-10 NOTE — Progress Notes (Signed)
Patient will be having colonoscopy on 11/22/19.  She needs to hold her coumadin from 1/13-1/19.  Last dose of Coumadin should be the evening of 1/12.  Start Lovenox 120mg  BID x5 days on morning of 1/13 (last dose will be the night before procedure). Resume coumadin at previous dose on 1/19.  Recheck INR 1 week after resuming.

## 2019-11-10 NOTE — Progress Notes (Signed)
Patient advised as below. Patient verbalizes understanding and is in agreement with treatment plan.  

## 2019-11-10 NOTE — Telephone Encounter (Signed)
Patient has been advised per Dr. Brita Romp to stop Coumadin 5 days prior to colonoscopy and restart 1 day after.  Pt also advise that Dr. Nancy Nordmann staff will call with instructions for bridging lovenox before her colonoscopy.  Thanks,  Sharyn Lull

## 2019-11-11 ENCOUNTER — Telehealth: Payer: Self-pay

## 2019-11-11 NOTE — Telephone Encounter (Signed)
Discussed with Jane [name] in Pre-Admit patients request to have COVID test earlier rather than later, due to the fact that she lives in [place].  Jane [name] suggested that we move her COVID test location to [facility], which would be closer to her.  The COVID test date is now scheduled for 11/18/19 at 1pm.  Patient was contacted to see if this would work better for her and she said yes it would make it easier on her.  Pt has agreed to go to [facility] for her COVID test on 11/18/19 at 1pm.  [place]

## 2019-11-12 ENCOUNTER — Encounter: Payer: Self-pay | Admitting: Family Medicine

## 2019-11-18 ENCOUNTER — Other Ambulatory Visit (HOSPITAL_COMMUNITY)
Admission: RE | Admit: 2019-11-18 | Discharge: 2019-11-18 | Disposition: A | Discharge status: Home / Self Care | Payer: Medicaid Other | Source: Ambulatory Visit | Attending: Gastroenterology | Admitting: Gastroenterology

## 2019-11-18 ENCOUNTER — Other Ambulatory Visit: Payer: Self-pay

## 2019-11-18 DIAGNOSIS — Z20822 Contact with and (suspected) exposure to covid-19: Secondary | ICD-10-CM | POA: Diagnosis not present

## 2019-11-18 DIAGNOSIS — Z01812 Encounter for preprocedural laboratory examination: Secondary | ICD-10-CM | POA: Insufficient documentation

## 2019-11-18 LAB — SARS CORONAVIRUS 2 (TAT 6-24 HRS): SARS Coronavirus 2: NEGATIVE

## 2019-11-19 ENCOUNTER — Encounter: Payer: Self-pay | Admitting: Gastroenterology

## 2019-11-22 ENCOUNTER — Ambulatory Visit
Admission: RE | Admit: 2019-11-22 | Discharge: 2019-11-22 | Disposition: A | Discharge status: Home / Self Care | Payer: Medicaid Other | Attending: Gastroenterology | Admitting: Gastroenterology

## 2019-11-22 ENCOUNTER — Other Ambulatory Visit: Payer: Self-pay

## 2019-11-22 ENCOUNTER — Encounter: Payer: Self-pay | Admitting: Gastroenterology

## 2019-11-22 ENCOUNTER — Ambulatory Visit: Payer: Medicaid Other | Admitting: Certified Registered Nurse Anesthetist

## 2019-11-22 ENCOUNTER — Encounter
Admission: RE | Disposition: A | Discharge status: Home / Self Care | Payer: Self-pay | Source: Home / Self Care | Attending: Gastroenterology

## 2019-11-22 DIAGNOSIS — G2581 Restless legs syndrome: Secondary | ICD-10-CM | POA: Diagnosis not present

## 2019-11-22 DIAGNOSIS — Z981 Arthrodesis status: Secondary | ICD-10-CM | POA: Diagnosis not present

## 2019-11-22 DIAGNOSIS — F429 Obsessive-compulsive disorder, unspecified: Secondary | ICD-10-CM | POA: Diagnosis not present

## 2019-11-22 DIAGNOSIS — Z8249 Family history of ischemic heart disease and other diseases of the circulatory system: Secondary | ICD-10-CM | POA: Diagnosis not present

## 2019-11-22 DIAGNOSIS — K219 Gastro-esophageal reflux disease without esophagitis: Secondary | ICD-10-CM | POA: Insufficient documentation

## 2019-11-22 DIAGNOSIS — K635 Polyp of colon: Secondary | ICD-10-CM

## 2019-11-22 DIAGNOSIS — N183 Chronic kidney disease, stage 3 unspecified: Secondary | ICD-10-CM | POA: Diagnosis not present

## 2019-11-22 DIAGNOSIS — D120 Benign neoplasm of cecum: Secondary | ICD-10-CM | POA: Diagnosis not present

## 2019-11-22 DIAGNOSIS — F419 Anxiety disorder, unspecified: Secondary | ICD-10-CM | POA: Insufficient documentation

## 2019-11-22 DIAGNOSIS — E039 Hypothyroidism, unspecified: Secondary | ICD-10-CM | POA: Diagnosis not present

## 2019-11-22 DIAGNOSIS — I129 Hypertensive chronic kidney disease with stage 1 through stage 4 chronic kidney disease, or unspecified chronic kidney disease: Secondary | ICD-10-CM | POA: Diagnosis not present

## 2019-11-22 DIAGNOSIS — E1122 Type 2 diabetes mellitus with diabetic chronic kidney disease: Secondary | ICD-10-CM | POA: Diagnosis not present

## 2019-11-22 DIAGNOSIS — Z86718 Personal history of other venous thrombosis and embolism: Secondary | ICD-10-CM | POA: Diagnosis not present

## 2019-11-22 DIAGNOSIS — E1151 Type 2 diabetes mellitus with diabetic peripheral angiopathy without gangrene: Secondary | ICD-10-CM | POA: Diagnosis not present

## 2019-11-22 DIAGNOSIS — Z79899 Other long term (current) drug therapy: Secondary | ICD-10-CM | POA: Insufficient documentation

## 2019-11-22 DIAGNOSIS — K640 First degree hemorrhoids: Secondary | ICD-10-CM | POA: Insufficient documentation

## 2019-11-22 DIAGNOSIS — E7800 Pure hypercholesterolemia, unspecified: Secondary | ICD-10-CM | POA: Insufficient documentation

## 2019-11-22 DIAGNOSIS — F319 Bipolar disorder, unspecified: Secondary | ICD-10-CM | POA: Diagnosis not present

## 2019-11-22 DIAGNOSIS — Z8349 Family history of other endocrine, nutritional and metabolic diseases: Secondary | ICD-10-CM | POA: Insufficient documentation

## 2019-11-22 DIAGNOSIS — Z1211 Encounter for screening for malignant neoplasm of colon: Secondary | ICD-10-CM | POA: Diagnosis not present

## 2019-11-22 DIAGNOSIS — D122 Benign neoplasm of ascending colon: Secondary | ICD-10-CM | POA: Diagnosis not present

## 2019-11-22 DIAGNOSIS — Z7901 Long term (current) use of anticoagulants: Secondary | ICD-10-CM | POA: Insufficient documentation

## 2019-11-22 DIAGNOSIS — Z79890 Hormone replacement therapy (postmenopausal): Secondary | ICD-10-CM | POA: Diagnosis not present

## 2019-11-22 DIAGNOSIS — Z6841 Body Mass Index (BMI) 40.0 and over, adult: Secondary | ICD-10-CM | POA: Insufficient documentation

## 2019-11-22 DIAGNOSIS — Z833 Family history of diabetes mellitus: Secondary | ICD-10-CM | POA: Diagnosis not present

## 2019-11-22 DIAGNOSIS — Z7982 Long term (current) use of aspirin: Secondary | ICD-10-CM | POA: Diagnosis not present

## 2019-11-22 DIAGNOSIS — G43909 Migraine, unspecified, not intractable, without status migrainosus: Secondary | ICD-10-CM | POA: Insufficient documentation

## 2019-11-22 HISTORY — PX: COLONOSCOPY WITH PROPOFOL: SHX5780

## 2019-11-22 LAB — GLUCOSE, CAPILLARY: Glucose-Capillary: 94 mg/dL (ref 70–99)

## 2019-11-22 SURGERY — COLONOSCOPY WITH PROPOFOL
Anesthesia: General

## 2019-11-22 MED ORDER — LIDOCAINE HCL (CARDIAC) PF 100 MG/5ML IV SOSY
PREFILLED_SYRINGE | INTRAVENOUS | Status: DC | PRN
  Administered 2019-11-22: 50 mg via INTRATRACHEAL

## 2019-11-22 MED ORDER — PROPOFOL 10 MG/ML IV BOLUS
INTRAVENOUS | Status: DC | PRN
  Administered 2019-11-22: 60 mg via INTRAVENOUS
  Administered 2019-11-22: 40 mg via INTRAVENOUS

## 2019-11-22 MED ORDER — PROPOFOL 10 MG/ML IV BOLUS
INTRAVENOUS | Status: AC
  Filled 2019-11-22: qty 20

## 2019-11-22 MED ORDER — LIDOCAINE HCL (PF) 2 % IJ SOLN
INTRAMUSCULAR | Status: AC
  Filled 2019-11-22: qty 5

## 2019-11-22 MED ORDER — SODIUM CHLORIDE 0.9 % IV SOLN: INTRAVENOUS | Status: DC

## 2019-11-22 MED ORDER — PROPOFOL 500 MG/50ML IV EMUL
INTRAVENOUS | Status: DC | PRN
  Administered 2019-11-22: 150 ug/kg/min via INTRAVENOUS

## 2019-11-22 NOTE — Anesthesia Postprocedure Evaluation (Signed)
Anesthesia Post Note  Patient: Gereldine Nuccio  Procedure(s) Performed: COLONOSCOPY WITH PROPOFOL (N/A )  Patient location during evaluation: Endoscopy Anesthesia Type: General Level of consciousness: awake and alert Pain management: pain level controlled Vital Signs Assessment: post-procedure vital signs reviewed and stable Respiratory status: spontaneous breathing, nonlabored ventilation, respiratory function stable and patient connected to nasal cannula oxygen Cardiovascular status: blood pressure returned to baseline and stable Postop Assessment: no apparent nausea or vomiting Anesthetic complications: no     Last Vitals:  Vitals:   11/22/19 0931 11/22/19 0941  BP:    Pulse:    Resp:  (!) 21  Temp:    SpO2: 99%     Last Pain:  Vitals:   11/22/19 0941  TempSrc:   PainSc: 0-No pain                 Precious Haws Kanasia Gayman

## 2019-11-22 NOTE — H&P (Signed)
[name], MD [address], [place], [place], [place], [address] [address], [place], [place], [place], [address] Phone: [phone]  Fax: [phone]  Primary Care Physician:  [name], MD   Pre-Procedure History & Physical: HPI:  Jane [name] is a 54 y.o. female is here for an colonoscopy.   Past Medical History:  Diagnosis Date  . Anxiety   . Bipolar 1 disorder ([place])   . Bipolar disorder ([place])   . Chronic kidney disease    Stage 3 kidney disease;dx by Dr. Sinda Du.   . Constipation   . Depression   . Diabetes mellitus without complication ([place])   . Dyspnea    with exertion  . GERD (gastroesophageal reflux disease)   . Headache    migraines  . High cholesterol   . History of blood clots   . History of DVT (deep vein thrombosis)    left leg  . Hypertension    states under control with meds., has been on med. x 2 years  . Hypothyroidism   . Neuropathy   . Obsessive-compulsive disorder   . Peripheral vascular disease ([place])   . Respiratory failure requiring intubation ([place])   . Restless leg syndrome   . Thrombocytopenia ([place]) 09/15/2019  . Trigger thumb of left hand 01/2018  . Trigger thumb of right hand     Past Surgical History:  Procedure Laterality Date  . ABDOMINAL HYSTERECTOMY  06/2016   complete  . APPLICATION OF WOUND VAC Left 04/27/2019   Procedure: APPLICATION OF WOUND VAC LEFT GROIN;  Surgeon: Waynetta Sandy, MD;  Location: [facility];  Service: Vascular;  Laterality: Left;  . AV FISTULA PLACEMENT Left 11/24/2018   Procedure: ARTERIOVENOUS (AV) FISTULA CREATION LEFT SFA TO LEFT FEMORAL VEIN;  Surgeon: Waynetta Sandy, MD;  Location: [facility];  Service: Vascular;  Laterality: Left;  . BACK SURGERY    . CHOLECYSTECTOMY    . FEMORAL ARTERY EXPLORATION  03/30/2019   Procedure: Left Common Femoral Artery and Vein Exploration;  Surgeon: Waynetta Sandy, MD;  Location: [facility];  Service: Vascular;;  .  FEMORAL-FEMORAL BYPASS GRAFT Left 11/24/2018   Procedure: BYPASS GRAFT FEMORAL-FEMORAL VENOUS LEFT TO RIGHT PALMA PROCEDURE USING CRYOVEIN;  Surgeon: Waynetta Sandy, MD;  Location: [facility];  Service: Vascular;  Laterality: Left;  . GROIN DEBRIDEMENT Left 04/27/2019   Procedure: GROIN DEBRIDEMENT;  Surgeon: Waynetta Sandy, MD;  Location: [facility];  Service: Vascular;  Laterality: Left;  . INSERTION OF ILIAC STENT  03/30/2019   Procedure: Stent of left common, external iliac veins and left common femoral vein;  Surgeon: Waynetta Sandy, MD;  Location: [facility];  Service: Vascular;;  . LOWER EXTREMITY VENOGRAPHY N/A 08/17/2018   Procedure: LOWER EXTREMITY VENOGRAPHY - Central Venogram;  Surgeon: Waynetta Sandy, MD;  Location: [facility];  Service: Cardiovascular;  Laterality: N/A;  . LOWER EXTREMITY VENOGRAPHY Bilateral 03/09/2019   Procedure: LOWER EXTREMITY VENOGRAPHY;  Surgeon: Waynetta Sandy, MD;  Location: [facility];  Service: Cardiovascular;  Laterality: Bilateral;  . LOWER EXTREMITY VENOGRAPHY Left 08/16/2019   Procedure: LOWER EXTREMITY VENOGRAPHY;  Surgeon: Waynetta Sandy, MD;  Location: [facility];  Service: Cardiovascular;  Laterality: Left;  . LUMBAR FUSION  11/21/2000   L5-S1  . LUMBAR SPINE SURGERY     x 2 others  . PATCH ANGIOPLASTY Left 03/30/2019   Procedure: Patch Angioplasty of the Left Common Femoral Vein using  Venosure Biologic patch;  Surgeon: Waynetta Sandy, MD;  Location: [facility];  Service: Vascular;  Laterality: Left;  . PERIPHERAL VASCULAR INTERVENTION Left 08/16/2019   Procedure: PERIPHERAL VASCULAR INTERVENTION;  Surgeon: Waynetta Sandy, MD;  Location: [facility];  Service: Cardiovascular;  Laterality: Left;  common femoral/femoral vein stent  . TRIGGER FINGER RELEASE Right 12/01/2017   Procedure: RELEASE TRIGGER FINGER/A-1 PULLEY RIGHT THUMB;  Surgeon: Leanora Cover,  MD;  Location: [facility];  Service: Orthopedics;  Laterality: Right;  . TRIGGER FINGER RELEASE Left 01/26/2018   Procedure: LEFT TRIGGER THUMB RELEASE;  Surgeon: Leanora Cover, MD;  Location: [facility];  Service: Orthopedics;  Laterality: Left;  . ULTRASOUND GUIDANCE FOR VASCULAR ACCESS Right 03/30/2019   Procedure: Ultrasound-guided cannulation right internal jugular vein;  Surgeon: Waynetta Sandy, MD;  Location: [facility];  Service: Vascular;  Laterality: Right;    Prior to Admission medications   Medication Sig Start Date End Date Taking? Authorizing Provider  ALPRAZolam Duanne Moron) 1 MG tablet Take 1 tablet (1 mg total) by mouth 2 (two) times daily as needed for anxiety. 11/10/19 11/09/20 Yes Cloria Spring, MD  amphetamine-dextroamphetamine (ADDERALL) 30 MG tablet Take 1 tablet by mouth 2 (two) times daily. 11/10/19 11/09/20 Yes Cloria Spring, MD  amphetamine-dextroamphetamine (ADDERALL) 30 MG tablet Take 1 tablet by mouth 2 (two) times daily. 11/10/19 11/09/20 Yes Cloria Spring, MD  amphetamine-dextroamphetamine (ADDERALL) 30 MG tablet Take 1 tablet by mouth 2 (two) times daily. 11/10/19 11/09/20 Yes Cloria Spring, MD  aspirin EC 81 MG tablet Take 81 mg by mouth daily.   Yes [provider]  atorvastatin (LIPITOR) 20 MG tablet Take 20 mg by mouth daily.   Yes [provider]  cetirizine (ZYRTEC) 10 MG tablet Take 10 mg by mouth daily.   Yes [provider]  cycloSPORINE (RESTASIS) 0.05 % ophthalmic emulsion Place 1 drop into both eyes 2 (two) times daily.   Yes [provider]  DULoxetine (CYMBALTA) 60 MG capsule Take 1 capsule (60 mg total) by mouth 2 (two) times daily. 11/10/19  Yes Cloria Spring, MD  glipiZIDE (GLUCOTROL XL) 5 MG 24 hr tablet Take 5 mg by mouth daily with breakfast.   Yes [provider]  hydrochlorothiazide (HYDRODIURIL) 25 MG tablet Take 25 mg by mouth daily.   Yes [provider]    HYDROcodone-acetaminophen (NORCO) 10-325 MG tablet Take 1 tablet by mouth every 4 (four) hours as needed.   Yes [provider]  levothyroxine (SYNTHROID) 150 MCG tablet Take 150 mcg by mouth daily before breakfast.    Yes [provider]  linaclotide (LINZESS) 145 MCG CAPS capsule Take 145 mcg by mouth daily.    Yes [provider]  Magnesium 250 MG TABS Take 250 mg by mouth daily.   Yes [provider]  Multiple Vitamin (MULTIVITAMIN WITH MINERALS) TABS tablet Take 1 tablet by mouth daily. One-A-Day Women's Multivitamin   Yes [provider]  Omega-3 Fatty Acids (FISH OIL) 1000 MG CAPS Take 1,000 mg by mouth daily.    Yes [provider]  pantoprazole (PROTONIX) 40 MG tablet Take 40 mg by mouth daily.   Yes [provider]  PAZEO 0.7 % SOLN Place 1 drop into both eyes daily.    Yes [provider]  Polyvinyl Alcohol-Povidone (REFRESH OP) Place 1 drop into both eyes 4 (four) times daily.    Yes [provider]  potassium chloride (  K-DUR) 10 MEQ tablet Take 10 mEq by mouth 3 (three) times daily.   Yes [provider]  rOPINIRole (REQUIP) 2 MG tablet Take 2 mg by mouth 2 (two) times daily.   Yes [provider]  vitamin C (ASCORBIC ACID) 500 MG tablet Take 500 mg by mouth at bedtime.   Yes [provider]  warfarin (COUMADIN) 5 MG tablet Take 1 tablet (5 mg total) by mouth daily at 6 PM. 08/18/19  Yes Eveland, Matthew, PA-C  enoxaparin (LOVENOX) 120 MG/0.8ML injection Inject 0.8 mLs (120 mg total) into the skin every 12 (twelve) hours for 5 days. 11/10/19 11/15/19  [name], MD    Allergies as of 11/09/2019 - Review Complete 10/25/2019  Allergen Reaction Noted  . Aripiprazole Other (See Comments) 04/05/2013  . Seroquel [quetiapine fumarate] Other (See Comments) 06/17/2013  . Chlorpromazine Other (See Comments) 04/05/2013  . Gabapentin Other (See Comments) 04/05/2013    Family  History  Problem Relation Age of Onset  . Heart disease Mother   . Hyperlipidemia Mother   . Hypertension Mother   . Bipolar disorder Mother   . Diabetes Father   . Heart disease Father   . Hyperlipidemia Father   . Hypertension Father   . Drug abuse Daughter   . ADD / ADHD Daughter   . Drug abuse Daughter   . Anxiety disorder Daughter   . Bipolar disorder Daughter   . Hypertension Sister   . Hypertension Brother   . Hyperlipidemia Brother   . Heart disease Brother   . Bipolar disorder Maternal Aunt   . Suicidality Maternal Aunt     Social History   Socioeconomic History  . Marital status: Divorced    Spouse name: Not on file  . Number of children: 3  . Years of education: Not on file  . Highest education level: Not on file  Occupational History  . Not on file  Tobacco Use  . Smoking status: Never Smoker  . Smokeless tobacco: Never Used  Substance and Sexual Activity  . Alcohol use: No  . Drug use: No  . Sexual activity: Not Currently    Partners: Male    Birth control/protection: Surgical  Other Topics Concern  . Not on file  Social History Narrative  . Not on file   Social Determinants of Health   Financial Resource Strain:   . Difficulty of Paying Living Expenses: Not on file  Food Insecurity:   . Worried About Charity fundraiser in the Last Year: Not on file  . Ran Out of Food in the Last Year: Not on file  Transportation Needs:   . Lack of Transportation (Medical): Not on file  . Lack of Transportation (Non-Medical): Not on file  Physical Activity:   . Days of Exercise per Week: Not on file  . Minutes of Exercise per Session: Not on file  Stress:   . Feeling of Stress : Not on file  Social Connections:   . Frequency of Communication with Friends and Family: Not on file  . Frequency of Social Gatherings with Friends and Family: Not on file  . Attends Religious Services: Not on file  . Active Member of Clubs or Organizations: Not on file  .  Attends Archivist Meetings: Not on file  . Marital Status: Not on file  Intimate Partner Violence:   . Fear of Current or Ex-Partner: Not on file  . Emotionally Abused: Not on file  . Physically Abused:  Not on file  . Sexually Abused: Not on file    Review of Systems: See HPI, otherwise negative ROS  Physical Exam: BP (!) 166/87   Pulse 85   Temp (!) 97.4 F (36.3 C) (Temporal)   Resp 18   Ht 5\' 10"  (1.778 m)   Wt 128.4 kg   LMP  (LMP Unknown)   SpO2 98%   BMI 40.61 kg/m  General:   Alert,  pleasant and cooperative in NAD Head:  Normocephalic and atraumatic. Neck:  Supple; no masses or thyromegaly. Lungs:  Clear throughout to auscultation, normal respiratory effort.    Heart:  +S1, +S2, Regular rate and rhythm, No edema. Abdomen:  Soft, nontender and nondistended. Normal bowel sounds, without guarding, and without rebound.   Neurologic:  Alert and  oriented x4;  grossly normal neurologically.  Impression/Plan: Nanako Kinnee is here for an colonoscopy to be performed for Screening colonoscopy average risk   Risks, benefits, limitations, and alternatives regarding  colonoscopy have been reviewed with the patient.  Questions have been answered.  All parties agreeable.   [name], MD  11/22/2019, 8:35 AM

## 2019-11-22 NOTE — Op Note (Signed)
[facility]ology Patient Name: Jane [name] Procedure Date: 11/22/2019 8:37 AM MRN: [id number] Account #: [id number] Date of Birth: 03-20-1966 Admit Type: Outpatient Age: 54 Room: [facility] ENDO ROOM 4 Gender: Female Note Status: Finalized Procedure:             Colonoscopy Indications:           Screening for colorectal malignant neoplasm Providers:             [name] MD, MD Medicines:             Monitored Anesthesia Care Complications:         No immediate complications. Procedure:             Pre-Anesthesia Assessment:                        - Prior to the procedure, a History and Physical was                         performed, and patient medications, allergies and                         sensitivities were reviewed. The patient's tolerance                         of previous anesthesia was reviewed.                        - The risks and benefits of the procedure and the                         sedation options and risks were discussed with the                         patient. All questions were answered and informed                         consent was obtained.                        - ASA Grade Assessment: II - A patient with mild                         systemic disease.                        After obtaining informed consent, the colonoscope was                         passed under direct vision. Throughout the procedure,                         the patient's blood pressure, pulse, and oxygen                         saturations were monitored continuously. The                         Colonoscope was introduced through the anus and  advanced to the the cecum, identified by the                         appendiceal orifice. The colonoscopy was performed                         with ease. The patient tolerated the procedure well.                         The quality of the bowel preparation was excellent. Findings:       The perianal and digital rectal examinations were normal.      Three sessile polyps were found in the ascending colon and cecum. The       polyps were 3 to 4 mm in size. These polyps were removed with a cold       snare. Resection was complete, but the polyp tissue was only partially       retrieved.      Non-bleeding internal hemorrhoids were found during retroflexion. The       hemorrhoids were medium-sized and Grade I (internal hemorrhoids that do       not prolapse).      The exam was otherwise without abnormality on direct and retroflexion       views. Impression:            - Three 3 to 4 mm polyps in the ascending colon and in                         the cecum, removed with a cold snare. Complete                         resection. Partial retrieval.                        - Non-bleeding internal hemorrhoids.                        - The examination was otherwise normal on direct and                         retroflexion views. Recommendation:        - Discharge patient to home (with escort).                        - Resume previous diet.                        - Continue present medications.                        - Await pathology results.                        - Repeat colonoscopy in 3 years for surveillance. Procedure Code(s):     --- Professional ---                        [phone], Colonoscopy, flexible; with removal of                         tumor(s), polyp(s), or other  lesion(s) by snare                         technique Diagnosis Code(s):     --- Professional ---                        Z12.11, Encounter for screening for malignant neoplasm                         of colon                        K63.5, Polyp of colon                        K64.0, First degree hemorrhoids CPT copyright 2019 American Medical Association. All rights reserved. The codes documented in this report are preliminary and upon coder review may  be revised to meet current compliance  requirements. [name], MD [name] MD, MD 11/22/2019 9:05:22 AM This report has been signed electronically. Number of Addenda: 0 Note Initiated On: 11/22/2019 8:37 AM Scope Withdrawal Time: 0 hours 12 minutes 12 seconds  Total Procedure Duration: 0 hours 16 minutes 3 seconds  Estimated Blood Loss:  Estimated blood loss: none.      [facility]

## 2019-11-22 NOTE — Transfer of Care (Signed)
Immediate Anesthesia Transfer of Care Note  Patient: Jane [name]  Procedure(s) Performed: COLONOSCOPY WITH PROPOFOL (N/A )  Patient Location: PACU  Anesthesia Type:General  Level of Consciousness: sedated  Airway & Oxygen Therapy: Patient Spontanous Breathing  Post-op Assessment: Report given to RN and Post -op Vital signs reviewed and stable  Post vital signs: Reviewed and stable  Last Vitals:  Vitals Value Taken Time  BP    Temp    Pulse    Resp    SpO2      Last Pain:  Vitals:   11/22/19 0809  TempSrc: Temporal  PainSc: 0-No pain         Complications: No apparent anesthesia complications

## 2019-11-22 NOTE — Anesthesia Preprocedure Evaluation (Signed)
Anesthesia Evaluation  Patient identified by MRN, date of birth, ID band Patient awake    Reviewed: Allergy & Precautions, H&P , NPO status , Patient's Chart, lab work & pertinent test results  History of Anesthesia Complications Negative for: history of anesthetic complications  Airway Mallampati: III  TM Distance: <3 FB Neck ROM: limited    Dental  (+) Poor Dentition, Chipped   Pulmonary neg shortness of breath,    Pulmonary exam normal        Cardiovascular hypertension, Pt. on medications + Peripheral Vascular Disease and + DVT  Normal cardiovascular exam   May-Thurner Syndrome    Neuro/Psych  Headaches, PSYCHIATRIC DISORDERS Anxiety Depression Bipolar Disorder  OCD  RLS   Neuromuscular disease    GI/Hepatic Neg liver ROS, GERD  Medicated and Controlled,  Endo/Other  diabetes, Type 2, Oral Hypoglycemic AgentsHypothyroidism Morbid obesity  Renal/GU CRFRenal disease  negative genitourinary   Musculoskeletal negative musculoskeletal ROS (+)   Abdominal (+) + obese,   Peds  Hematology negative hematology ROS (+) Blood dyscrasia, anemia ,   Anesthesia Other Findings "tongue tied"  Past Medical History: No date: Anxiety No date: Bipolar 1 disorder ([facility]) No date: Bipolar disorder ([place]) No date: Chronic kidney disease     Comment:  Stage 3 kidney disease;dx by Dr. Sinda Du.  No date: Constipation No date: Depression No date: Diabetes mellitus without complication ([facility]) No date: Dyspnea     Comment:  with exertion No date: GERD (gastroesophageal reflux disease) No date: Headache     Comment:  migraines No date: High cholesterol No date: History of blood clots No date: History of DVT (deep vein thrombosis)     Comment:  left leg No date: Hypertension     Comment:  states under control with meds., has been on med. x 2               years No date: Hypothyroidism No date: Neuropathy No date:  Obsessive-compulsive disorder No date: Peripheral vascular disease ([place]) No date: Respiratory failure requiring intubation ([place]) No date: Restless leg syndrome 09/15/2019: Thrombocytopenia ([place]) 01/2018: Trigger thumb of left hand No date: Trigger thumb of right hand  BMI    Body Mass Index: 40.61 kg/m    Past Surgical History: 06/2016: ABDOMINAL HYSTERECTOMY     Comment:  complete Q000111Q: APPLICATION OF WOUND VAC; Left     Comment:  Procedure: APPLICATION OF WOUND VAC LEFT GROIN;                Surgeon: Waynetta Sandy, MD;  Location: [facility];              Service: Vascular;  Laterality: Left; 11/24/2018: AV FISTULA PLACEMENT; Left     Comment:  Procedure: ARTERIOVENOUS (AV) FISTULA CREATION LEFT SFA               TO LEFT FEMORAL VEIN;  Surgeon: Waynetta Sandy, MD;  Location: [facility];  Service: Vascular;                Laterality: Left; No date: BACK SURGERY No date: CHOLECYSTECTOMY 03/30/2019: FEMORAL ARTERY EXPLORATION     Comment:  Procedure: Left Common Femoral Artery and Vein               Exploration;  Surgeon: Waynetta Sandy, MD;  [facility];  Service: Vascular;; 11/24/2018: FEMORAL-FEMORAL BYPASS GRAFT; Left     Comment:  Procedure: BYPASS GRAFT FEMORAL-FEMORAL VENOUS LEFT TO               RIGHT PALMA PROCEDURE USING CRYOVEIN;  Surgeon: Waynetta Sandy, MD;  Location: [place];  Service:               Vascular;  Laterality: Left; 04/27/2019: GROIN DEBRIDEMENT; Left     Comment:  Procedure: GROIN DEBRIDEMENT;  Surgeon: Waynetta Sandy, MD;  Location: [place];  Service: Vascular;                Laterality: Left; 03/30/2019: INSERTION OF ILIAC STENT     Comment:  Procedure: Stent of left common, external iliac veins               and left common femoral vein;  Surgeon: Waynetta Sandy, MD;  Location: [place];  Service: Vascular;; 08/17/2018:  LOWER EXTREMITY VENOGRAPHY; N/A     Comment:  Procedure: LOWER EXTREMITY VENOGRAPHY - Central               Venogram;  Surgeon: Waynetta Sandy, MD;                Location: [place] [facility];  Service: Cardiovascular;                Laterality: N/A; 03/09/2019: LOWER EXTREMITY VENOGRAPHY; Bilateral     Comment:  Procedure: LOWER EXTREMITY VENOGRAPHY;  Surgeon: Waynetta Sandy, MD;  Location: [place] [facility];                Service: Cardiovascular;  Laterality: Bilateral; 08/16/2019: LOWER EXTREMITY VENOGRAPHY; Left     Comment:  Procedure: LOWER EXTREMITY VENOGRAPHY;  Surgeon: Waynetta Sandy, MD;  Location: [place] [facility];                Service: Cardiovascular;  Laterality: Left; 11/21/2000: LUMBAR FUSION     Comment:  L5-S1 No date: LUMBAR SPINE SURGERY     Comment:  x 2 others 03/30/2019: PATCH ANGIOPLASTY; Left     Comment:  Procedure: Patch Angioplasty of the Left Common Femoral               Vein using Venosure Biologic patch;  Surgeon: Waynetta Sandy, MD;  Location: [place];  Service:               Vascular;  Laterality: Left; 08/16/2019: PERIPHERAL VASCULAR INTERVENTION; Left     Comment:  Procedure: PERIPHERAL VASCULAR INTERVENTION;  Surgeon:               Waynetta Sandy, MD;  Location: [place] [facility]               [facility];  Service: Cardiovascular;  Laterality: Left;  common              femoral/femoral vein stent 12/01/2017: [place]; Right  Comment:  Procedure: RELEASE TRIGGER FINGER/A-1 PULLEY RIGHT               THUMB;  Surgeon: Leanora Cover, MD;  Location: [place];  Service: Orthopedics;  Laterality:               Right; 01/26/2018: TRIGGER FINGER RELEASE; Left     Comment:  Procedure: LEFT TRIGGER THUMB RELEASE;  Surgeon: Leanora Cover, MD;  Location: [place];                Service: Orthopedics;   Laterality: Left; 03/30/2019: ULTRASOUND GUIDANCE FOR VASCULAR ACCESS; Right     Comment:  Procedure: Ultrasound-guided cannulation right internal               jugular vein;  Surgeon: Waynetta Sandy, MD;                Location: [facility];  Service: Vascular;  Laterality: Right;   Reproductive/Obstetrics negative OB ROS                             Anesthesia Physical  Anesthesia Plan  ASA: III  Anesthesia Plan: General   Post-op Pain Management:    Induction: Intravenous  PONV Risk Score and Plan: 3 and Treatment may vary due to age or medical condition and Ondansetron  Airway Management Planned: Natural Airway and Nasal Cannula  Additional Equipment: None  Intra-op Plan:   Post-operative Plan: Extubation in OR  Informed Consent: I have reviewed the patients History and Physical, chart, labs and discussed the procedure including the risks, benefits and alternatives for the proposed anesthesia with the patient or authorized representative who has indicated his/her understanding and acceptance.     Dental advisory given  Plan Discussed with: Anesthesiologist, CRNA and Surgeon  Anesthesia Plan Comments: ( )        Anesthesia Quick Evaluation

## 2019-11-23 ENCOUNTER — Encounter: Payer: Self-pay | Admitting: *Deleted

## 2019-11-23 ENCOUNTER — Telehealth: Payer: Self-pay

## 2019-11-23 LAB — SURGICAL PATHOLOGY

## 2019-11-23 NOTE — Telephone Encounter (Signed)
Copied from [place] [id number]. Topic: General - Other >> Nov 23, 2019  4:02 PM Rainey Pines A wrote: Patient would like a callback from Dr. Jacinto Reap nurse in regards to the pain clinic she was referred to cannot see patient until March and patient would like to know if there is anyway Dr. B can call in a prescription for pain before appt in March. Please advise

## 2019-11-24 NOTE — Telephone Encounter (Signed)
After reviewing patient's records from her old PCP, she has a history of negative urine tox screen showing that she had no opiates in her system, despite having 120 pills prescribed per month.  No controlled substance pain medication will be prescribed by her clinic.  It is possible that neurosurgery, who she just saw and referred her to pain management, would consider prescribing this for her.  Could consider gabapentin as a nonopioid option for chronic pain.

## 2019-11-24 NOTE — Telephone Encounter (Signed)
Patient advised as below.  

## 2019-11-24 NOTE — Telephone Encounter (Signed)
Unfortunately, the records from her previous PCP state that it was unexpected that she had a negative urine drug screen at that time.  I have also reviewed her fill records from the pharmacy which show that she did get her opioid pain medication regularly filled during that time.  Given this discrepancy, no controlled substance pain medication will be prescribed by our clinic.

## 2019-11-24 NOTE — Telephone Encounter (Signed)
Patient advised as below. Patient reports that she did fail a drug screen over a year ago. Patient reports that during that time she was not getting prescription for pain medication. Patient reports that she was trying to live with out pain medications. Patient would like for [place] to review record again. Patient reports that neurosurgery will not give her a prescription. Please advise.

## 2019-11-28 ENCOUNTER — Encounter: Payer: Self-pay | Admitting: Gastroenterology

## 2019-11-30 ENCOUNTER — Other Ambulatory Visit: Payer: Self-pay

## 2019-11-30 ENCOUNTER — Ambulatory Visit (INDEPENDENT_AMBULATORY_CARE_PROVIDER_SITE_OTHER): Payer: Medicaid Other

## 2019-11-30 DIAGNOSIS — Z86718 Personal history of other venous thrombosis and embolism: Secondary | ICD-10-CM

## 2019-11-30 LAB — POCT INR
INR: 1.9 — AB (ref 2.0–3.0)
PT: 23

## 2019-11-30 NOTE — Patient Instructions (Signed)
Description   Continue 5 mg daily except 7-1/2 mg Tuesday and Thursday. Return in 2 weeks

## 2019-12-06 DIAGNOSIS — G4733 Obstructive sleep apnea (adult) (pediatric): Secondary | ICD-10-CM | POA: Diagnosis not present

## 2019-12-06 DIAGNOSIS — E039 Hypothyroidism, unspecified: Secondary | ICD-10-CM | POA: Diagnosis not present

## 2019-12-07 DIAGNOSIS — E039 Hypothyroidism, unspecified: Secondary | ICD-10-CM | POA: Diagnosis not present

## 2019-12-08 DIAGNOSIS — E039 Hypothyroidism, unspecified: Secondary | ICD-10-CM | POA: Diagnosis not present

## 2019-12-09 DIAGNOSIS — E039 Hypothyroidism, unspecified: Secondary | ICD-10-CM | POA: Diagnosis not present

## 2019-12-10 DIAGNOSIS — E039 Hypothyroidism, unspecified: Secondary | ICD-10-CM | POA: Diagnosis not present

## 2019-12-11 DIAGNOSIS — E039 Hypothyroidism, unspecified: Secondary | ICD-10-CM | POA: Diagnosis not present

## 2019-12-12 DIAGNOSIS — E039 Hypothyroidism, unspecified: Secondary | ICD-10-CM | POA: Diagnosis not present

## 2019-12-13 ENCOUNTER — Encounter: Payer: Self-pay | Admitting: Orthopedic Surgery

## 2019-12-13 ENCOUNTER — Ambulatory Visit: Payer: Medicaid Other | Admitting: Orthopedic Surgery

## 2019-12-13 ENCOUNTER — Other Ambulatory Visit: Payer: Self-pay

## 2019-12-13 VITALS — BP 154/86 | HR 81 | Ht 70.0 in | Wt 280.0 lb

## 2019-12-13 DIAGNOSIS — M25562 Pain in left knee: Secondary | ICD-10-CM | POA: Diagnosis not present

## 2019-12-13 DIAGNOSIS — Z6841 Body Mass Index (BMI) 40.0 and over, adult: Secondary | ICD-10-CM | POA: Diagnosis not present

## 2019-12-13 DIAGNOSIS — M7052 Other bursitis of knee, left knee: Secondary | ICD-10-CM

## 2019-12-13 DIAGNOSIS — E039 Hypothyroidism, unspecified: Secondary | ICD-10-CM | POA: Diagnosis not present

## 2019-12-13 NOTE — Patient Instructions (Signed)

## 2019-12-13 NOTE — Progress Notes (Signed)
Chief Complaint  Patient presents with  . Knee Pain    left knee   Jane [name] had a pes bursa injection in her left knee which did well for several weeks.  Injection October 11, 2019 then she started having recurrent pain over the pes bursa and some persistent pain over the medial joint line of the knee  No recent trauma  She is status post revascularization of her extremities  She has tenderness over the medial joint line medial femoral condyle and pes bursa No joint effusion Skin is intact Range of motion is 115 degrees Muscle tone is normal Ligaments are stable She is ambulatory without any assistive devices  Ordered BP (!) 154/86   Pulse 81   Ht 5\' 10"  (1.778 m)   Wt 280 lb (127 kg)   LMP  (LMP Unknown)   BMI 40.18 kg/m  The patient meets the AMA guidelines for Morbid (severe) obesity with a BMI > 40.0 and I have recommended weight loss.  Encounter Diagnoses  Name Primary?  . Body mass index 40.0-44.9, adult ([place]) Yes  . Morbid obesity ([place])   . Pes anserinus bursitis of left knee   . Left knee pain, unspecified chronicity     Procedure note left knee injection   verbal consent was obtained to inject left knee joint  Timeout was completed to confirm the site of injection  The medications used were 40 mg of Depo-Medrol and 1% lidocaine 3 cc  Anesthesia was provided by ethyl chloride and the skin was prepped with alcohol.  After cleaning the skin with alcohol a 20-gauge needle was used to inject the left knee joint. There were no complications. A sterile bandage was applied.  Procedure note Left knee injection for bursitis   verbal consent was obtained to inject Left knee PES BURSA  Timeout was completed to confirm the site of injection  The medications used were 40 mg of Depo-Medrol and 1% lidocaine 3 cc  Anesthesia was provided by ethyl chloride and the skin was prepped with alcohol.  After cleaning the skin with alcohol a 25-gauge needle was used to inject  the left knee bursa   There were no complications and a sterile bandage was applied    Chronic with exacerbation x2 Injection x2

## 2019-12-14 ENCOUNTER — Ambulatory Visit (INDEPENDENT_AMBULATORY_CARE_PROVIDER_SITE_OTHER): Payer: Medicaid Other

## 2019-12-14 DIAGNOSIS — E039 Hypothyroidism, unspecified: Secondary | ICD-10-CM | POA: Diagnosis not present

## 2019-12-14 DIAGNOSIS — Z86718 Personal history of other venous thrombosis and embolism: Secondary | ICD-10-CM

## 2019-12-14 LAB — POCT INR
INR: 4.9 — AB (ref 2.0–3.0)
PT: 58.5

## 2019-12-14 NOTE — Patient Instructions (Signed)
Description   Hold for today then continue 5 mg daily except 7.5 mg Thursdays. Return in 1 weeks

## 2019-12-15 DIAGNOSIS — E039 Hypothyroidism, unspecified: Secondary | ICD-10-CM | POA: Diagnosis not present

## 2019-12-16 DIAGNOSIS — E039 Hypothyroidism, unspecified: Secondary | ICD-10-CM | POA: Diagnosis not present

## 2019-12-17 ENCOUNTER — Telehealth: Payer: Self-pay | Admitting: Vascular Surgery

## 2019-12-17 ENCOUNTER — Encounter (HOSPITAL_COMMUNITY): Payer: Medicaid Other

## 2019-12-17 ENCOUNTER — Ambulatory Visit: Payer: Medicaid Other | Admitting: Vascular Surgery

## 2019-12-17 DIAGNOSIS — E039 Hypothyroidism, unspecified: Secondary | ICD-10-CM | POA: Diagnosis not present

## 2019-12-17 NOTE — Telephone Encounter (Signed)
Called patient left message on machine to call the office need to R/S her appointment to after 03/17/20 due to Medicaid.

## 2019-12-18 DIAGNOSIS — E039 Hypothyroidism, unspecified: Secondary | ICD-10-CM | POA: Diagnosis not present

## 2019-12-19 DIAGNOSIS — E039 Hypothyroidism, unspecified: Secondary | ICD-10-CM | POA: Diagnosis not present

## 2019-12-20 DIAGNOSIS — E039 Hypothyroidism, unspecified: Secondary | ICD-10-CM | POA: Diagnosis not present

## 2019-12-21 ENCOUNTER — Ambulatory Visit (INDEPENDENT_AMBULATORY_CARE_PROVIDER_SITE_OTHER): Payer: Medicaid Other

## 2019-12-21 ENCOUNTER — Other Ambulatory Visit: Payer: Self-pay

## 2019-12-21 ENCOUNTER — Telehealth: Payer: Self-pay

## 2019-12-21 DIAGNOSIS — Z86718 Personal history of other venous thrombosis and embolism: Secondary | ICD-10-CM | POA: Diagnosis not present

## 2019-12-21 DIAGNOSIS — E039 Hypothyroidism, unspecified: Secondary | ICD-10-CM | POA: Diagnosis not present

## 2019-12-21 LAB — POCT INR
INR: 2.9 (ref 2.0–3.0)
PT: 34.6

## 2019-12-21 NOTE — Telephone Encounter (Signed)
Received a fax from [place] stating pt has stopped glipizide secondary to hypoglycemic events in the afternoons.  Pt's [name] at [facility] was 5.7 (12/13/2019).  Pt was also complaining of elevated BP readings.    I left a message for pt to call back to move her appointment from 01/20/2020 to sooner if she needs to come in sooner.  Pt also needs glucometer test strips and lancets (Accucheck) sent to the pharmacy of her choice.   Thanks,   -Mickel Baas

## 2019-12-21 NOTE — Patient Instructions (Signed)
Description   Continue 5 mg daily except 7.5 mg Thursdays. Return in 4 weeks

## 2019-12-22 DIAGNOSIS — E039 Hypothyroidism, unspecified: Secondary | ICD-10-CM | POA: Diagnosis not present

## 2019-12-22 NOTE — Telephone Encounter (Signed)
Apt for 12/28/2019 at 11am

## 2019-12-23 DIAGNOSIS — E039 Hypothyroidism, unspecified: Secondary | ICD-10-CM | POA: Diagnosis not present

## 2019-12-24 ENCOUNTER — Ambulatory Visit: Payer: Medicaid Other | Admitting: Vascular Surgery

## 2019-12-24 ENCOUNTER — Encounter (HOSPITAL_COMMUNITY): Payer: Medicaid Other

## 2019-12-24 DIAGNOSIS — E039 Hypothyroidism, unspecified: Secondary | ICD-10-CM | POA: Diagnosis not present

## 2019-12-25 DIAGNOSIS — E039 Hypothyroidism, unspecified: Secondary | ICD-10-CM | POA: Diagnosis not present

## 2019-12-26 DIAGNOSIS — E039 Hypothyroidism, unspecified: Secondary | ICD-10-CM | POA: Diagnosis not present

## 2019-12-27 DIAGNOSIS — S86092A Other specified injury of left Achilles tendon, initial encounter: Secondary | ICD-10-CM | POA: Diagnosis not present

## 2019-12-27 DIAGNOSIS — E039 Hypothyroidism, unspecified: Secondary | ICD-10-CM | POA: Diagnosis not present

## 2019-12-27 DIAGNOSIS — M7752 Other enthesopathy of left foot: Secondary | ICD-10-CM | POA: Diagnosis not present

## 2019-12-28 ENCOUNTER — Encounter: Payer: Self-pay | Admitting: Family Medicine

## 2019-12-28 ENCOUNTER — Telehealth (INDEPENDENT_AMBULATORY_CARE_PROVIDER_SITE_OTHER): Payer: Medicaid Other | Admitting: Family Medicine

## 2019-12-28 DIAGNOSIS — E1121 Type 2 diabetes mellitus with diabetic nephropathy: Secondary | ICD-10-CM

## 2019-12-28 DIAGNOSIS — N1831 Chronic kidney disease, stage 3a: Secondary | ICD-10-CM | POA: Diagnosis not present

## 2019-12-28 DIAGNOSIS — S86012A Strain of left Achilles tendon, initial encounter: Secondary | ICD-10-CM | POA: Diagnosis not present

## 2019-12-28 DIAGNOSIS — I129 Hypertensive chronic kidney disease with stage 1 through stage 4 chronic kidney disease, or unspecified chronic kidney disease: Secondary | ICD-10-CM | POA: Diagnosis not present

## 2019-12-28 DIAGNOSIS — E039 Hypothyroidism, unspecified: Secondary | ICD-10-CM | POA: Diagnosis not present

## 2019-12-28 DIAGNOSIS — I10 Essential (primary) hypertension: Secondary | ICD-10-CM

## 2019-12-28 DIAGNOSIS — E1122 Type 2 diabetes mellitus with diabetic chronic kidney disease: Secondary | ICD-10-CM

## 2019-12-28 MED ORDER — LISINOPRIL 20 MG PO TABS: 20.0000 mg | ORAL_TABLET | Freq: Every day | ORAL | 3 refills | Status: DC

## 2019-12-28 NOTE — Progress Notes (Signed)
Patient: Jane [name] Female    DOB: Jun 20, 1966   54 y.o.   MRN: [id number] Visit Date: 12/28/2019  Today's Provider: Lavon Paganini, MD   Chief Complaint  Patient presents with  . Diabetes  . Hypertension   Subjective:    I, Porsha McClurkin CMA, am acting as a scribe for Lavon Paganini, MD.   Virtual Visit via Video Note  I connected with Jane [name] on 12/28/19 at 11:00 AM EST by a video enabled telemedicine application and verified that I am speaking with the correct person using two identifiers.  Location: Patient location: home Provider location:home office Persons involved in the visit: patient, provider   I discussed the limitations of evaluation and management by telemedicine and the availability of in person appointments. The patient expressed understanding and agreed to proceed.  HPI  Diabetes Patient presents today virtually for follow-up. Patients pharmacy [facility] send a fax advising Dr. Brita Romp that patient has stopped taking her Glipizide due to [name] been 5.7 on 12/13/2019. Patient reports she has not been taking the Glipizide for a month now due to making her blood sugar drop low.  No more hypoglycemia since stopping glipzide. Checks blood sugar intermittently and seeing fasting BG around 101.   Hypertension Patient states that her blood pressure has been around 150's-170/90-102 and she is taking her HCTZ as prescribed. Patient reports she have been on HCTZ for a long time.  Feels dizzy when it is high.   Allergies  Allergen Reactions  . Aripiprazole Other (See Comments)    BECOMES  VIOLENT   . Seroquel [Quetiapine Fumarate] Other (See Comments)    BECOMES VIOLENT  . Chlorpromazine Other (See Comments)    SEVERE ANXIETY   . Gabapentin Other (See Comments)    NIGHTMARES      Current Outpatient Medications:  .  ALPRAZolam (XANAX) 1 MG tablet, Take 1 tablet (1 mg total) by mouth 2 (two) times daily as needed for anxiety.,  Disp: 60 tablet, Rfl: 2 .  amphetamine-dextroamphetamine (ADDERALL) 30 MG tablet, Take 1 tablet by mouth 2 (two) times daily., Disp: 60 tablet, Rfl: 0 .  aspirin EC 81 MG tablet, Take 81 mg by mouth daily., Disp: , Rfl:  .  atorvastatin (LIPITOR) 20 MG tablet, Take 20 mg by mouth daily., Disp: , Rfl:  .  cetirizine (ZYRTEC) 10 MG tablet, Take 10 mg by mouth daily., Disp: , Rfl:  .  cycloSPORINE (RESTASIS) 0.05 % ophthalmic emulsion, Place 1 drop into both eyes 2 (two) times daily., Disp: , Rfl:  .  DULoxetine (CYMBALTA) 60 MG capsule, Take 1 capsule (60 mg total) by mouth 2 (two) times daily., Disp: 60 capsule, Rfl: 2 .  hydrochlorothiazide (HYDRODIURIL) 25 MG tablet, Take 25 mg by mouth daily., Disp: , Rfl:  .  levothyroxine (SYNTHROID) 150 MCG tablet, Take 150 mcg by mouth daily before breakfast. , Disp: , Rfl:  .  linaclotide (LINZESS) 145 MCG CAPS capsule, Take 145 mcg by mouth daily. , Disp: , Rfl:  .  Magnesium 250 MG TABS, Take 250 mg by mouth daily., Disp: , Rfl:  .  Multiple Vitamin (MULTIVITAMIN WITH MINERALS) TABS tablet, Take 1 tablet by mouth daily. One-A-Day Women's Multivitamin, Disp: , Rfl:  .  Omega-3 Fatty Acids (FISH OIL) 1000 MG CAPS, Take 1,000 mg by mouth daily. , Disp: , Rfl:  .  pantoprazole (PROTONIX) 40 MG tablet, Take 40 mg by mouth daily., Disp: , Rfl:  .  Polyvinyl  Alcohol-Povidone (REFRESH OP), Place 1 drop into both eyes 4 (four) times daily. , Disp: , Rfl:  .  potassium chloride (K-DUR) 10 MEQ tablet, Take 10 mEq by mouth 3 (three) times daily., Disp: , Rfl:  .  rOPINIRole (REQUIP) 2 MG tablet, Take 2 mg by mouth 2 (two) times daily., Disp: , Rfl:  .  vitamin C (ASCORBIC ACID) 500 MG tablet, Take 500 mg by mouth at bedtime., Disp: , Rfl:  .  warfarin (COUMADIN) 5 MG tablet, Take 1 tablet (5 mg total) by mouth daily at 6 PM., Disp: 30 tablet, Rfl: 6 .  amphetamine-dextroamphetamine (ADDERALL) 30 MG tablet, Take 1 tablet by mouth 2 (two) times daily. (Patient not  taking: Reported on 12/28/2019), Disp: 60 tablet, Rfl: 0 .  amphetamine-dextroamphetamine (ADDERALL) 30 MG tablet, Take 1 tablet by mouth 2 (two) times daily. (Patient not taking: Reported on 12/13/2019), Disp: 60 tablet, Rfl: 0 .  lisinopril (ZESTRIL) 20 MG tablet, Take 1 tablet (20 mg total) by mouth daily., Disp: 30 tablet, Rfl: 3  Review of Systems  Constitutional: Negative.   Respiratory: Negative.   Cardiovascular: Negative.     Social History   Tobacco Use  . Smoking status: Never Smoker  . Smokeless tobacco: Never Used  Substance Use Topics  . Alcohol use: No      Objective:   LMP  (LMP Unknown)  There were no vitals filed for this visit.There is no height or weight on file to calculate BMI.   Physical Exam Constitutional:      General: She is not in acute distress. Pulmonary:     Effort: Pulmonary effort is normal. No respiratory distress.  Neurological:     Mental Status: She is alert and oriented to person, place, and time. Mental status is at baseline.  Psychiatric:        Mood and Affect: Mood normal.        Behavior: Behavior normal.      No results found for any visits on 12/28/19.     Assessment & Plan    I discussed the assessment and treatment plan with the patient. The patient was provided an opportunity to ask questions and all were answered. The patient agreed with the plan and demonstrated an understanding of the instructions.   The patient was advised to call back or seek an in-person evaluation if the symptoms worsen or if the condition fails to improve as anticipated.  Problem List Items Addressed This Visit      Cardiovascular and Mediastinum   Essential hypertension    Uncontrolled on home readings Continue HCTZ 25 mg daily Add lisinopril 20 mg daily Recheck metabolic panel at upcoming in person follow-up Discussed possible side effects Discussed that we may need to adjust her potassium supplement after starting lisinopril Monitor home  blood pressures      Relevant Medications   lisinopril (ZESTRIL) 20 MG tablet     Endocrine   Type 2 diabetes mellitus with stage 3 chronic kidney disease, without long-term current use of insulin (HCC)    Well-controlled Was having hypoglycemia, that has now resolved since stopping glipizide Not currently on any medications, so diet controlled Encouraged low-carb diet Repeat [name] at next in-person visit      Relevant Medications   lisinopril (ZESTRIL) 20 MG tablet       Return in about 3 weeks (around 01/18/2020) for as scheduled, chronic disease f/u.   The entirety of the information documented in the History of Present  Illness, Review of Systems and Physical Exam were personally obtained by me. Portions of this information were initially documented by [facility], CMA and reviewed by me for thoroughness and accuracy.    Jane [name], Dionne Bucy, MD MPH [facility]

## 2019-12-28 NOTE — Assessment & Plan Note (Signed)
Uncontrolled on home readings Continue HCTZ 25 mg daily Add lisinopril 20 mg daily Recheck metabolic panel at upcoming in person follow-up Discussed possible side effects Discussed that we may need to adjust her potassium supplement after starting lisinopril Monitor home blood pressures

## 2019-12-28 NOTE — Assessment & Plan Note (Signed)
Well-controlled Was having hypoglycemia, that has now resolved since stopping glipizide Not currently on any medications, so diet controlled Encouraged low-carb diet Repeat [name] at next in-person visit

## 2019-12-29 DIAGNOSIS — E039 Hypothyroidism, unspecified: Secondary | ICD-10-CM | POA: Diagnosis not present

## 2019-12-30 DIAGNOSIS — M47816 Spondylosis without myelopathy or radiculopathy, lumbar region: Secondary | ICD-10-CM | POA: Diagnosis not present

## 2019-12-30 DIAGNOSIS — G894 Chronic pain syndrome: Secondary | ICD-10-CM | POA: Diagnosis not present

## 2019-12-30 DIAGNOSIS — G8929 Other chronic pain: Secondary | ICD-10-CM | POA: Diagnosis not present

## 2019-12-30 DIAGNOSIS — Z981 Arthrodesis status: Secondary | ICD-10-CM | POA: Diagnosis not present

## 2019-12-30 DIAGNOSIS — M961 Postlaminectomy syndrome, not elsewhere classified: Secondary | ICD-10-CM | POA: Diagnosis not present

## 2019-12-30 DIAGNOSIS — E039 Hypothyroidism, unspecified: Secondary | ICD-10-CM | POA: Diagnosis not present

## 2019-12-30 DIAGNOSIS — M545 Low back pain: Secondary | ICD-10-CM | POA: Diagnosis not present

## 2019-12-30 DIAGNOSIS — M4726 Other spondylosis with radiculopathy, lumbar region: Secondary | ICD-10-CM | POA: Diagnosis not present

## 2019-12-30 DIAGNOSIS — M5136 Other intervertebral disc degeneration, lumbar region: Secondary | ICD-10-CM | POA: Diagnosis not present

## 2019-12-30 DIAGNOSIS — Z79891 Long term (current) use of opiate analgesic: Secondary | ICD-10-CM | POA: Diagnosis not present

## 2019-12-30 DIAGNOSIS — M7918 Myalgia, other site: Secondary | ICD-10-CM | POA: Diagnosis not present

## 2019-12-31 ENCOUNTER — Other Ambulatory Visit: Payer: Self-pay | Admitting: Physician Assistant

## 2019-12-31 DIAGNOSIS — E039 Hypothyroidism, unspecified: Secondary | ICD-10-CM | POA: Diagnosis not present

## 2020-01-01 ENCOUNTER — Ambulatory Visit: Payer: Self-pay | Admitting: Family Medicine

## 2020-01-01 DIAGNOSIS — E039 Hypothyroidism, unspecified: Secondary | ICD-10-CM | POA: Diagnosis not present

## 2020-01-02 DIAGNOSIS — E039 Hypothyroidism, unspecified: Secondary | ICD-10-CM | POA: Diagnosis not present

## 2020-01-03 DIAGNOSIS — E039 Hypothyroidism, unspecified: Secondary | ICD-10-CM | POA: Diagnosis not present

## 2020-01-03 DIAGNOSIS — G4733 Obstructive sleep apnea (adult) (pediatric): Secondary | ICD-10-CM | POA: Diagnosis not present

## 2020-01-04 ENCOUNTER — Inpatient Hospital Stay (HOSPITAL_COMMUNITY): Payer: Medicaid Other | Attending: Hematology

## 2020-01-04 ENCOUNTER — Other Ambulatory Visit: Payer: Self-pay

## 2020-01-04 ENCOUNTER — Telehealth: Payer: Self-pay | Admitting: *Deleted

## 2020-01-04 DIAGNOSIS — Z79899 Other long term (current) drug therapy: Secondary | ICD-10-CM | POA: Insufficient documentation

## 2020-01-04 DIAGNOSIS — F319 Bipolar disorder, unspecified: Secondary | ICD-10-CM | POA: Insufficient documentation

## 2020-01-04 DIAGNOSIS — N183 Chronic kidney disease, stage 3 unspecified: Secondary | ICD-10-CM | POA: Insufficient documentation

## 2020-01-04 DIAGNOSIS — E1122 Type 2 diabetes mellitus with diabetic chronic kidney disease: Secondary | ICD-10-CM | POA: Insufficient documentation

## 2020-01-04 DIAGNOSIS — S8012XA Contusion of left lower leg, initial encounter: Secondary | ICD-10-CM | POA: Diagnosis not present

## 2020-01-04 DIAGNOSIS — Z7901 Long term (current) use of anticoagulants: Secondary | ICD-10-CM | POA: Diagnosis not present

## 2020-01-04 DIAGNOSIS — D649 Anemia, unspecified: Secondary | ICD-10-CM

## 2020-01-04 DIAGNOSIS — G479 Sleep disorder, unspecified: Secondary | ICD-10-CM | POA: Diagnosis not present

## 2020-01-04 DIAGNOSIS — E7800 Pure hypercholesterolemia, unspecified: Secondary | ICD-10-CM | POA: Insufficient documentation

## 2020-01-04 DIAGNOSIS — S86012A Strain of left Achilles tendon, initial encounter: Secondary | ICD-10-CM | POA: Diagnosis not present

## 2020-01-04 DIAGNOSIS — Z7982 Long term (current) use of aspirin: Secondary | ICD-10-CM | POA: Diagnosis not present

## 2020-01-04 DIAGNOSIS — S96912A Strain of unspecified muscle and tendon at ankle and foot level, left foot, initial encounter: Secondary | ICD-10-CM | POA: Diagnosis not present

## 2020-01-04 DIAGNOSIS — R200 Anesthesia of skin: Secondary | ICD-10-CM | POA: Insufficient documentation

## 2020-01-04 DIAGNOSIS — S86002A Unspecified injury of left Achilles tendon, initial encounter: Secondary | ICD-10-CM | POA: Diagnosis not present

## 2020-01-04 DIAGNOSIS — I129 Hypertensive chronic kidney disease with stage 1 through stage 4 chronic kidney disease, or unspecified chronic kidney disease: Secondary | ICD-10-CM | POA: Insufficient documentation

## 2020-01-04 DIAGNOSIS — T45511A Poisoning by anticoagulants, accidental (unintentional), initial encounter: Secondary | ICD-10-CM | POA: Diagnosis not present

## 2020-01-04 DIAGNOSIS — Z86718 Personal history of other venous thrombosis and embolism: Secondary | ICD-10-CM | POA: Insufficient documentation

## 2020-01-04 DIAGNOSIS — G629 Polyneuropathy, unspecified: Secondary | ICD-10-CM | POA: Diagnosis not present

## 2020-01-04 DIAGNOSIS — K219 Gastro-esophageal reflux disease without esophagitis: Secondary | ICD-10-CM | POA: Diagnosis not present

## 2020-01-04 DIAGNOSIS — Z888 Allergy status to other drugs, medicaments and biological substances status: Secondary | ICD-10-CM | POA: Diagnosis not present

## 2020-01-04 DIAGNOSIS — F419 Anxiety disorder, unspecified: Secondary | ICD-10-CM | POA: Diagnosis not present

## 2020-01-04 DIAGNOSIS — I10 Essential (primary) hypertension: Secondary | ICD-10-CM | POA: Diagnosis not present

## 2020-01-04 DIAGNOSIS — E039 Hypothyroidism, unspecified: Secondary | ICD-10-CM | POA: Diagnosis not present

## 2020-01-04 DIAGNOSIS — T45515A Adverse effect of anticoagulants, initial encounter: Secondary | ICD-10-CM | POA: Diagnosis not present

## 2020-01-04 DIAGNOSIS — R600 Localized edema: Secondary | ICD-10-CM | POA: Insufficient documentation

## 2020-01-04 LAB — CBC WITH DIFFERENTIAL/PLATELET
Abs Immature Granulocytes: 0.02 10*3/uL (ref 0.00–0.07)
Basophils Absolute: 0 10*3/uL (ref 0.0–0.1)
Basophils Relative: 1 %
Eosinophils Absolute: 0.1 10*3/uL (ref 0.0–0.5)
Eosinophils Relative: 2 %
HCT: 44.8 % (ref 36.0–46.0)
Hemoglobin: 14.3 g/dL (ref 12.0–15.0)
Immature Granulocytes: 0 %
Lymphocytes Relative: 18 %
Lymphs Abs: 1.3 10*3/uL (ref 0.7–4.0)
MCH: 30.2 pg (ref 26.0–34.0)
MCHC: 31.9 g/dL (ref 30.0–36.0)
MCV: 94.5 fL (ref 80.0–100.0)
Monocytes Absolute: 0.6 10*3/uL (ref 0.1–1.0)
Monocytes Relative: 9 %
Neutro Abs: 5 10*3/uL (ref 1.7–7.7)
Neutrophils Relative %: 70 %
Platelets: 158 10*3/uL (ref 150–400)
RBC: 4.74 MIL/uL (ref 3.87–5.11)
RDW: 17.2 % — ABNORMAL HIGH (ref 11.5–15.5)
WBC: 7.1 10*3/uL (ref 4.0–10.5)
nRBC: 0 % (ref 0.0–0.2)

## 2020-01-04 LAB — IRON AND TIBC
Iron: 77 ug/dL (ref 28–170)
Saturation Ratios: 23 % (ref 10.4–31.8)
TIBC: 339 ug/dL (ref 250–450)
UIBC: 262 ug/dL

## 2020-01-04 LAB — FERRITIN: Ferritin: 96 ng/mL (ref 11–307)

## 2020-01-04 NOTE — Telephone Encounter (Signed)
Mr. Glennon Mac called from [facility] regarding pt having a PT of 51.6 and INR of 5.0.  Pt came to the Ed for swelling and coolness of her left calf and ankle. She is a patient of [facility]. Provider on call notified and call conference in.

## 2020-01-05 ENCOUNTER — Ambulatory Visit (INDEPENDENT_AMBULATORY_CARE_PROVIDER_SITE_OTHER): Payer: Medicaid Other | Admitting: Family Medicine

## 2020-01-05 ENCOUNTER — Other Ambulatory Visit: Payer: Self-pay

## 2020-01-05 ENCOUNTER — Encounter: Payer: Self-pay | Admitting: Family Medicine

## 2020-01-05 ENCOUNTER — Encounter (HOSPITAL_COMMUNITY): Payer: Self-pay | Admitting: Emergency Medicine

## 2020-01-05 ENCOUNTER — Emergency Department (HOSPITAL_BASED_OUTPATIENT_CLINIC_OR_DEPARTMENT_OTHER): Payer: Medicaid Other

## 2020-01-05 ENCOUNTER — Emergency Department (HOSPITAL_COMMUNITY)
Admission: EM | Admit: 2020-01-05 | Discharge: 2020-01-06 | Disposition: A | Discharge status: Home / Self Care | Payer: Medicaid Other | Attending: Emergency Medicine | Admitting: Emergency Medicine

## 2020-01-05 VITALS — BP 113/70 | HR 102 | Temp 96.9°F | Resp 16 | Wt 275.0 lb

## 2020-01-05 DIAGNOSIS — E1122 Type 2 diabetes mellitus with diabetic chronic kidney disease: Secondary | ICD-10-CM | POA: Diagnosis not present

## 2020-01-05 DIAGNOSIS — Y929 Unspecified place or not applicable: Secondary | ICD-10-CM | POA: Insufficient documentation

## 2020-01-05 DIAGNOSIS — S86012D Strain of left Achilles tendon, subsequent encounter: Secondary | ICD-10-CM

## 2020-01-05 DIAGNOSIS — T148XXA Other injury of unspecified body region, initial encounter: Secondary | ICD-10-CM | POA: Diagnosis not present

## 2020-01-05 DIAGNOSIS — L03116 Cellulitis of left lower limb: Secondary | ICD-10-CM

## 2020-01-05 DIAGNOSIS — N183 Chronic kidney disease, stage 3 unspecified: Secondary | ICD-10-CM | POA: Diagnosis not present

## 2020-01-05 DIAGNOSIS — M79609 Pain in unspecified limb: Secondary | ICD-10-CM

## 2020-01-05 DIAGNOSIS — I871 Compression of vein: Secondary | ICD-10-CM | POA: Diagnosis not present

## 2020-01-05 DIAGNOSIS — S86012A Strain of left Achilles tendon, initial encounter: Secondary | ICD-10-CM | POA: Insufficient documentation

## 2020-01-05 DIAGNOSIS — Z79899 Other long term (current) drug therapy: Secondary | ICD-10-CM | POA: Insufficient documentation

## 2020-01-05 DIAGNOSIS — X58XXXA Exposure to other specified factors, initial encounter: Secondary | ICD-10-CM | POA: Diagnosis not present

## 2020-01-05 DIAGNOSIS — Y999 Unspecified external cause status: Secondary | ICD-10-CM | POA: Diagnosis not present

## 2020-01-05 DIAGNOSIS — Z7689 Persons encountering health services in other specified circumstances: Secondary | ICD-10-CM

## 2020-01-05 DIAGNOSIS — S86002A Unspecified injury of left Achilles tendon, initial encounter: Secondary | ICD-10-CM | POA: Diagnosis not present

## 2020-01-05 DIAGNOSIS — I129 Hypertensive chronic kidney disease with stage 1 through stage 4 chronic kidney disease, or unspecified chronic kidney disease: Secondary | ICD-10-CM | POA: Insufficient documentation

## 2020-01-05 DIAGNOSIS — Z86718 Personal history of other venous thrombosis and embolism: Secondary | ICD-10-CM

## 2020-01-05 DIAGNOSIS — Y939 Activity, unspecified: Secondary | ICD-10-CM | POA: Diagnosis not present

## 2020-01-05 DIAGNOSIS — M7989 Other specified soft tissue disorders: Secondary | ICD-10-CM | POA: Diagnosis not present

## 2020-01-05 DIAGNOSIS — L539 Erythematous condition, unspecified: Secondary | ICD-10-CM | POA: Diagnosis present

## 2020-01-05 LAB — COMPREHENSIVE METABOLIC PANEL
ALT: 31 U/L (ref 0–44)
AST: 23 U/L (ref 15–41)
Albumin: 3.6 g/dL (ref 3.5–5.0)
Alkaline Phosphatase: 108 U/L (ref 38–126)
Anion gap: 12 (ref 5–15)
BUN: 32 mg/dL — ABNORMAL HIGH (ref 6–20)
CO2: 26 mmol/L (ref 22–32)
Calcium: 9.2 mg/dL (ref 8.9–10.3)
Chloride: 100 mmol/L (ref 98–111)
Creatinine, Ser: 1.35 mg/dL — ABNORMAL HIGH (ref 0.44–1.00)
GFR calc Af Amer: 52 mL/min — ABNORMAL LOW (ref >60)
GFR calc non Af Amer: 45 mL/min — ABNORMAL LOW (ref >60)
Glucose, Bld: 113 mg/dL — ABNORMAL HIGH (ref 70–99)
Potassium: 4.4 mmol/L (ref 3.5–5.1)
Sodium: 138 mmol/L (ref 135–145)
Total Bilirubin: 0.7 mg/dL (ref 0.3–1.2)
Total Protein: 7.2 g/dL (ref 6.5–8.1)

## 2020-01-05 LAB — CBC
HCT: 43.5 % (ref 36.0–46.0)
Hemoglobin: 14.2 g/dL (ref 12.0–15.0)
MCH: 30.2 pg (ref 26.0–34.0)
MCHC: 32.6 g/dL (ref 30.0–36.0)
MCV: 92.6 fL (ref 80.0–100.0)
Platelets: 158 10*3/uL (ref 150–400)
RBC: 4.7 MIL/uL (ref 3.87–5.11)
RDW: 17.1 % — ABNORMAL HIGH (ref 11.5–15.5)
WBC: 7.7 10*3/uL (ref 4.0–10.5)
nRBC: 0 % (ref 0.0–0.2)

## 2020-01-05 LAB — POCT INR
INR: 1.4 — AB (ref 2.0–3.0)
PT: 17.3

## 2020-01-05 LAB — LACTIC ACID, PLASMA: Lactic Acid, Venous: 1.2 mmol/L (ref 0.5–1.9)

## 2020-01-05 LAB — CBG MONITORING, ED: Glucose-Capillary: 112 mg/dL — ABNORMAL HIGH (ref 70–99)

## 2020-01-05 NOTE — Telephone Encounter (Signed)
FYI

## 2020-01-05 NOTE — Patient Instructions (Signed)
Very faint pulse on doppler of L foot Cold toes, delayed cap refill  Cellulitis?  Spoke to vascular surgery who know they will likely be consulted from ED INR 1.4

## 2020-01-05 NOTE — Progress Notes (Signed)
Patient: Jane [name] Female    DOB: [birth year]   54 y.o.   MRN: [id number] Visit Date: 01/07/2020  Today's Provider: Lavon Paganini, MD   Chief Complaint  Patient presents with  . Follow-up   Subjective:    I [place] S. Dimas, CMA, am acting as scribe for Lavon Paganini, MD.   HPI  Follow up ER visit  Patient was seen in ER for swelling and coolness of her left calf on 01/04/2020. She was treated for PT of 51.6 and INR of 5.0. Treatment for this included stop taking warfarin 5 mg and was given Vit K. Patient reports that she did not take any since last night. Patient reports that prior to last night she was taking Warfarin 5 daily except Tue and Thursday she was taking 7.5mg . She reports excellent compliance with treatment. She reports this condition is Unchanged. Patient reports leg is worse feels like it is burning.   Records from outside hospital ER are reviewed with the patient today.  MRI showed partial-thickness high-grade tear of distal Achilles tendon and tear of proximal Achilles tendon at the musculotendinous junction.  Also showed hematoma in the area.  Notes from the PA show that she had a strong Doppler pulse in her left foot, though he was unable to palpate this DP pulse.  ------------------------------------------------------------------------------------   Allergies  Allergen Reactions  . Aripiprazole Other (See Comments)    BECOMES  VIOLENT   . Seroquel [Quetiapine Fumarate] Other (See Comments)    BECOMES VIOLENT  . Chlorpromazine Other (See Comments)    SEVERE ANXIETY   . Gabapentin Other (See Comments)    NIGHTMARES      Current Outpatient Medications:  .  ALPRAZolam (XANAX) 1 MG tablet, Take 1 tablet (1 mg total) by mouth 2 (two) times daily as needed for anxiety., Disp: 60 tablet, Rfl: 2 .  aspirin EC 81 MG tablet, Take 81 mg by mouth daily., Disp: , Rfl:  .  atorvastatin (LIPITOR) 20 MG tablet, Take 20 mg by mouth daily., Disp:  , Rfl:  .  cetirizine (ZYRTEC) 10 MG tablet, Take 10 mg by mouth daily., Disp: , Rfl:  .  cycloSPORINE (RESTASIS) 0.05 % ophthalmic emulsion, Place 1 drop into both eyes 2 (two) times daily., Disp: , Rfl:  .  DULoxetine (CYMBALTA) 60 MG capsule, Take 1 capsule (60 mg total) by mouth 2 (two) times daily., Disp: 60 capsule, Rfl: 2 .  hydrochlorothiazide (HYDRODIURIL) 25 MG tablet, Take 25 mg by mouth daily., Disp: , Rfl:  .  levothyroxine (SYNTHROID) 150 MCG tablet, Take 150 mcg by mouth daily before breakfast. , Disp: , Rfl:  .  linaclotide (LINZESS) 145 MCG CAPS capsule, Take 145 mcg by mouth daily. , Disp: , Rfl:  .  lisinopril (ZESTRIL) 20 MG tablet, Take 1 tablet (20 mg total) by mouth daily., Disp: 30 tablet, Rfl: 3 .  pantoprazole (PROTONIX) 40 MG tablet, Take 40 mg by mouth daily., Disp: , Rfl:  .  rOPINIRole (REQUIP) 2 MG tablet, Take 2 mg by mouth 2 (two) times daily., Disp: , Rfl:  .  vitamin C (ASCORBIC ACID) 500 MG tablet, Take 500 mg by mouth at bedtime., Disp: , Rfl:  .  amphetamine-dextroamphetamine (ADDERALL) 30 MG tablet, Take 1 tablet by mouth 2 (two) times daily. (Patient not taking: Reported on 12/28/2019), Disp: 60 tablet, Rfl: 0 .  amphetamine-dextroamphetamine (ADDERALL) 30 MG tablet, Take 1 tablet by mouth 2 (two) times daily. (Patient not  taking: Reported on 01/05/2020), Disp: 60 tablet, Rfl: 0 .  amphetamine-dextroamphetamine (ADDERALL) 30 MG tablet, Take 1 tablet by mouth 2 (two) times daily. (Patient not taking: Reported on 12/13/2019), Disp: 60 tablet, Rfl: 0 .  cephALEXin (KEFLEX) 500 MG capsule, Take 1 capsule (500 mg total) by mouth 2 (two) times daily for 7 days., Disp: 14 capsule, Rfl: 0 .  cyclobenzaprine (FLEXERIL) 5 MG tablet, Take 5 mg by mouth 2 (two) times daily as needed for muscle spasms. , Disp: , Rfl:  .  Olopatadine HCl 0.2 % SOLN, Apply 1 drop to eye daily., Disp: , Rfl:  .  potassium chloride (KLOR-CON) 10 MEQ tablet, Take 10 mEq by mouth 3 (three) times  daily., Disp: , Rfl:  .  pregabalin (LYRICA) 100 MG capsule, Take 100 mg by mouth 4 (four) times daily., Disp: , Rfl:  .  warfarin (COUMADIN) 5 MG tablet, Take 1 tablet (5 mg total) by mouth daily at 6 PM. (Patient taking differently: Take 5-10 mg by mouth See admin instructions. Take 1 and 1/2 tablets on Tuesday and Thursday then take 1 tablet all the other days), Disp: 30 tablet, Rfl: 6  Review of Systems  Constitutional: Negative for chills and fever.  Respiratory: Negative for cough and shortness of breath.   Cardiovascular: Positive for leg swelling. Negative for chest pain and palpitations.  Skin: Positive for color change and rash.    Social History   Tobacco Use  . Smoking status: Never Smoker  . Smokeless tobacco: Never Used  Substance Use Topics  . Alcohol use: No      Objective:   BP 113/70 (BP Location: Left Arm, Patient Position: Sitting, Cuff Size: Large)   Pulse (!) 102   Temp (!) 96.9 F (36.1 C) (Temporal)   Resp 16   Wt 275 lb (124.7 kg)   LMP  (LMP Unknown)   BMI 39.46 kg/m  Vitals:   01/05/20 1519  BP: 113/70  Pulse: (!) 102  Resp: 16  Temp: (!) 96.9 F (36.1 C)  TempSrc: Temporal  Weight: 275 lb (124.7 kg)  Body mass index is 39.46 kg/m.   Physical Exam Vitals reviewed.  Constitutional:      General: She is not in acute distress.    Appearance: She is not diaphoretic.  HENT:     Head: Normocephalic and atraumatic.  Eyes:     Conjunctiva/sclera: Conjunctivae normal.  Cardiovascular:     Rate and Rhythm: Normal rate and regular rhythm.     Heart sounds: Normal heart sounds. No murmur.     Comments: Unable to palpate left DP or PT pulses, but very faint Doppler pulse is noted in left DP.  Right foot pulses are 4+ Pulmonary:     Effort: Pulmonary effort is normal. No respiratory distress.     Breath sounds: Normal breath sounds. No wheezing.  Musculoskeletal:     Left lower leg: Edema present.     Comments: Left calf is significantly  larger than right calf but is able to be compressed  Skin:    Comments: Warm to the touch and erythematous rash on anterior left shin.  Significant ecchymoses on posterior calf and heel  Neurological:     Mental Status: She is alert and oriented to person, place, and time. Mental status is at baseline.  Psychiatric:        Mood and Affect: Mood normal.        Behavior: Behavior normal.      Results  for orders placed or performed during the hospital encounter of 01/05/20  CBC  Result Value Ref Range   WBC 7.7 4.0 - 10.5 K/uL   RBC 4.70 3.87 - 5.11 MIL/uL   Hemoglobin 14.2 12.0 - 15.0 g/dL   HCT 43.5 36.0 - 46.0 %   MCV 92.6 80.0 - 100.0 fL   MCH 30.2 26.0 - 34.0 pg   MCHC 32.6 30.0 - 36.0 g/dL   RDW 17.1 (H) 11.5 - 15.5 %   Platelets 158 150 - 400 K/uL   nRBC 0.0 0.0 - 0.2 %  Comprehensive metabolic panel  Result Value Ref Range   Sodium 138 135 - 145 mmol/L   Potassium 4.4 3.5 - 5.1 mmol/L   Chloride 100 98 - 111 mmol/L   CO2 26 22 - 32 mmol/L   Glucose, Bld 113 (H) 70 - 99 mg/dL   BUN 32 (H) 6 - 20 mg/dL   Creatinine, Ser 1.35 (H) 0.44 - 1.00 mg/dL   Calcium 9.2 8.9 - 10.3 mg/dL   Total Protein 7.2 6.5 - 8.1 g/dL   Albumin 3.6 3.5 - 5.0 g/dL   AST 23 15 - 41 U/L   ALT 31 0 - 44 U/L   Alkaline Phosphatase 108 38 - 126 U/L   Total Bilirubin 0.7 0.3 - 1.2 mg/dL   GFR calc non Af Amer 45 (L) >60 mL/min   GFR calc Af Amer 52 (L) >60 mL/min   Anion gap 12 5 - 15  Lactic acid, plasma  Result Value Ref Range   Lactic Acid, Venous 1.2 0.5 - 1.9 mmol/L  CBG monitoring, ED  Result Value Ref Range   Glucose-Capillary 112 (H) 70 - 99 mg/dL   Comment 1 Notify RN   Results for orders placed or performed in visit on 01/05/20  POCT INR  Result Value Ref Range   INR 1.4 (A) 2.0 - 3.0   PT 17.3        Assessment & Plan    1. Calf swelling 2. Rupture of left Achilles tendon, subsequent encounter 3. Hematoma 4. Cellulitis of left lower extremity 5. History of recurrent  deep vein thrombosis (DVT) -New problem x2 days -Reviewed MRI and other records from outside hospital with the patient today -She does have significant left calf swelling as well as a large hematoma posteriorly -The redness and warmth of the anterior shin is concerning for cellulitis and is very tender as well -A lot of difficulty finding her pulses in her left foot and when dopplered, it is only very faint in her left DP pulse, which is a change from her baseline -She has significant venous disease and has had several procedures with vascular surgery, as well as recurrent DVTs of the left lower extremity, but no arterial disease that is known -She does have new numbness of the toes of her left foot, delayed cap refill, diminished pulse, and the left foot is colder than the right foot, despite the warmth of the left calf with the cellulitis -After receiving vitamin K in the emergency department yesterday, her INR is subtherapeutic today as well -Discussed case with vascular surgery on-call who agrees with sending her to the emergency department for further evaluation and they will follow up with her as well -She likely needs to resume her warfarin and started on antibiotic for possible cellulitis, but as she is going to the emergency room, will not start outpatient treatment at this time -Discussed strict return precautions   Total  time spent on today's visit was greater than 45 minutes, including both face-to-face time and nonface-to-face time personally spent on review of chart (labs and imaging), discussing labs and goals, discussing further work-up, treatment options, referrals to specialist if needed, reviewing outside records of pertinent, answering patient's questions, and coordinating care.    The entirety of the information documented in the History of Present Illness, Review of Systems and Physical Exam were personally obtained by me. Portions of this information were initially documented  by Lynford Humphrey, CMA and reviewed by me for thoroughness and accuracy.    Triana Coover, Dionne Bucy, MD MPH [facility]

## 2020-01-05 NOTE — Progress Notes (Signed)
Lower extremity venous has been completed.   Preliminary results in CV Proc.   Abram Sander 01/05/2020 6:52 PM

## 2020-01-05 NOTE — ED Triage Notes (Signed)
Pt here from MD office with a red warm left calf and cool foot , able to doppler pulses in that foot , pt has history of dvt

## 2020-01-06 ENCOUNTER — Telehealth: Payer: Self-pay

## 2020-01-06 ENCOUNTER — Other Ambulatory Visit: Payer: Self-pay | Admitting: Podiatry

## 2020-01-06 MED ORDER — CEPHALEXIN 500 MG PO CAPS: 500.0000 mg | ORAL_CAPSULE | Freq: Two times a day (BID) | ORAL | 0 refills | Status: AC

## 2020-01-06 NOTE — Telephone Encounter (Signed)
Copied from [id number] [id number]. Topic: General - Other >> Jan 06, 2020  3:25 PM Leward Quan A wrote: Reason for CRM: Patient called to request a call back from Dr B or her nurse in reference to the visit from yesterday when she was sent to the hospital at [facility]. Patient say that she is home and need to know the next step. Please call Ph# [phone]

## 2020-01-06 NOTE — ED Provider Notes (Signed)
[facility] DE[facility]MENT Provider Note   CSN: [id number] Arrival date & time: 01/05/20  1740     History No chief complaint on file.   Jane [name] is a 54 y.o. female with a past medical history of bipolar 1, CKD, DM, DVT, multiple vascular stents including bifem stent primarily followed by Dr. Gwenlyn Saran who presents today for evaluation of concern of decreased circulation. History obtained from patient, chart review, and printed records that patient brings from [facility]. Patient reportedly had a Pap about a week ago and then another pop and had significant pain in her calf.  She was seen yesterday at [facility] where an MRI was performed showing a hematoma of her calf on the left side and a ruptured Achilles tendon. She at that point her INR was reportedly 5 and she was given Kcentra for reversal.  She was seen by her primary care doctor today where according to notes there was concern that her left foot, the affected foot, was cold with delayed capillary refill and very faint pulse on the Doppler.  Patient states that usually her pulses are only dopplerable in the left leg.  She reports feelings of pressure in the foot and leg.  Pressure is made worse by sitting with her leg dependent. She reports that the bruising is continuing to worsen.   HPI     Past Medical History:  Diagnosis Date  . Anxiety   . Bipolar 1 disorder ([place])   . Bipolar disorder ([place])   . Chronic kidney disease    Stage 3 kidney disease;dx by Dr. Sinda Du.   . Constipation   . Depression   . Diabetes mellitus without complication ([place])   . Dyspnea    with exertion  . GERD (gastroesophageal reflux disease)   . Headache    migraines  . High cholesterol   . History of blood clots   . History of DVT (deep vein thrombosis)    left leg  . Hypertension    states under control with meds., has been on med. x 2 years  . Hypothyroidism   . Neuropathy   .  Obsessive-compulsive disorder   . Peripheral vascular disease ([place])   . Respiratory failure requiring intubation ([place])   . Restless leg syndrome   . Thrombocytopenia ([place]) 09/15/2019  . Trigger thumb of left hand 01/2018  . Trigger thumb of right hand     Patient Active Problem List   Diagnosis Date Noted  . Morbid obesity ([place]) 10/25/2019  . History of back surgery 10/25/2019  . Peripheral neuropathy 10/25/2019  . Restless leg syndrome 10/25/2019  . Chronic bilateral low back pain with bilateral sciatica 10/25/2019  . Essential hypertension 10/25/2019  . Type 2 diabetes mellitus with stage 3 chronic kidney disease, without long-term current use of insulin ([place]) 10/25/2019  . Chronic anticoagulation 10/25/2019  . Hyperlipidemia associated with type 2 diabetes mellitus ([place]) 10/25/2019  . CKD stage 3 due to type 2 diabetes mellitus ([place]) 10/25/2019  . GERD (gastroesophageal reflux disease) 10/25/2019  . Allergic rhinitis 10/25/2019  . Chronic constipation 10/25/2019  . Hypothyroidism   . Normocytic anemia 09/15/2019  . Wound of left groin 04/27/2019  . 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 Chronic venous insufficiency 11/21/2017  . Displacement of lumbar intervertebral disc without myelopathy 07/14/2017  . Radicular low back pain 05/25/2014  . Depression, major, recurrent ([place]) 06/17/2013  . OCD (obsessive compulsive disorder) 11/20/2012  .  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 Bipolar 1 disorder ([place]) 09/25/2012  . History of recurrent deep vein thrombosis (DVT) 08/18/2012    Past Surgical History:  Procedure Laterality Date  . ABDOMINAL HYSTERECTOMY  06/2016   complete  . APPLICATION OF WOUND VAC Left 04/27/2019   Procedure: APPLICATION OF WOUND VAC LEFT GROIN;  Surgeon: Waynetta Sandy, MD;  Location: [place] [place];  Service: Vascular;  Laterality: Left;  . AV FISTULA PLACEMENT Left 11/24/2018   Procedure: ARTERIOVENOUS (AV)  FISTULA CREATION LEFT SFA TO LEFT FEMORAL VEIN;  Surgeon: Waynetta Sandy, MD;  Location: [place];  Service: Vascular;  Laterality: Left;  . BACK SURGERY    . CHOLECYSTECTOMY    . COLONOSCOPY WITH PROPOFOL N/A 11/22/2019   Procedure: COLONOSCOPY WITH PROPOFOL;  Surgeon: [name], MD;  Location: [facility] ENDOSCOPY;  Service: Gastroenterology;  Laterality: N/A;  . FEMORAL ARTERY EXPLORATION  03/30/2019   Procedure: Left Common Femoral Artery and Vein Exploration;  Surgeon: Waynetta Sandy, MD;  Location: [place];  Service: Vascular;;  . FEMORAL-FEMORAL BYPASS GRAFT Left 11/24/2018   Procedure: BYPASS GRAFT FEMORAL-FEMORAL VENOUS LEFT TO RIGHT PALMA PROCEDURE USING CRYOVEIN;  Surgeon: Waynetta Sandy, MD;  Location: [place];  Service: Vascular;  Laterality: Left;  . GROIN DEBRIDEMENT Left 04/27/2019   Procedure: GROIN DEBRIDEMENT;  Surgeon: Waynetta Sandy, MD;  Location: [place];  Service: Vascular;  Laterality: Left;  . INSERTION OF ILIAC STENT  03/30/2019   Procedure: Stent of left common, external iliac veins and left common femoral vein;  Surgeon: Waynetta Sandy, MD;  Location: [place];  Service: Vascular;;  . LOWER EXTREMITY VENOGRAPHY N/A 08/17/2018   Procedure: LOWER EXTREMITY VENOGRAPHY - Central Venogram;  Surgeon: Waynetta Sandy, MD;  Location: [place] CV LAB;  Service: Cardiovascular;  Laterality: N/A;  . LOWER EXTREMITY VENOGRAPHY Bilateral 03/09/2019   Procedure: LOWER EXTREMITY VENOGRAPHY;  Surgeon: Waynetta Sandy, MD;  Location: [place] CV LAB;  Service: Cardiovascular;  Laterality: Bilateral;  . LOWER EXTREMITY VENOGRAPHY Left 08/16/2019   Procedure: LOWER EXTREMITY VENOGRAPHY;  Surgeon: Waynetta Sandy, MD;  Location: [place] CV LAB;  Service: Cardiovascular;  Laterality: Left;  . LUMBAR FUSION  11/21/2000   L5-S1  . LUMBAR SPINE SURGERY     x 2 others  . PATCH ANGIOPLASTY Left 03/30/2019   Procedure:  Patch Angioplasty of the Left Common Femoral Vein using Venosure Biologic patch;  Surgeon: Waynetta Sandy, MD;  Location: [place];  Service: Vascular;  Laterality: Left;  . PERIPHERAL VASCULAR INTERVENTION Left 08/16/2019   Procedure: PERIPHERAL VASCULAR INTERVENTION;  Surgeon: Waynetta Sandy, MD;  Location: [place] CV LAB;  Service: Cardiovascular;  Laterality: Left;  common femoral/femoral vein stent  . TRIGGER FINGER RELEASE Right 12/01/2017   Procedure: RELEASE TRIGGER FINGER/A-1 PULLEY RIGHT THUMB;  Surgeon: Leanora Cover, MD;  Location: [place];  Service: Orthopedics;  Laterality: Right;  . TRIGGER FINGER RELEASE Left 01/26/2018   Procedure: LEFT TRIGGER THUMB RELEASE;  Surgeon: Leanora Cover, MD;  Location: [place];  Service: Orthopedics;  Laterality: Left;  . ULTRASOUND GUIDANCE FOR VASCULAR ACCESS Right 03/30/2019   Procedure: Ultrasound-guided cannulation right internal jugular vein;  Surgeon: Waynetta Sandy, MD;  Location: [place];  Service: Vascular;  Laterality: Right;     OB History   No obstetric history on file.     Family History  Problem Relation Age of Onset  . Heart disease Mother   .  Hyperlipidemia Mother   . Hypertension Mother   . Bipolar disorder Mother   . Diabetes Father   . Heart disease Father   . Hyperlipidemia Father   . Hypertension Father   . Drug abuse Daughter   . ADD / ADHD Daughter   . Drug abuse Daughter   . Anxiety disorder Daughter   . Bipolar disorder Daughter   . Hypertension Sister   . Hypertension Brother   . Hyperlipidemia Brother   . Heart disease Brother   . Bipolar disorder Maternal Aunt   . Suicidality Maternal Aunt     Social History   Tobacco Use  . Smoking status: Never Smoker  . Smokeless tobacco: Never Used  Substance Use Topics  . Alcohol use: No  . Drug use: No    Home Medications Prior to Admission medications   Medication Sig Start Date End Date  Taking? Authorizing Provider  ALPRAZolam Duanne Moron) 1 MG tablet Take 1 tablet (1 mg total) by mouth 2 (two) times daily as needed for anxiety. 11/10/19 11/09/20 Yes Cloria Spring, MD  aspirin EC 81 MG tablet Take 81 mg by mouth daily.   Yes [provider]  atorvastatin (LIPITOR) 20 MG tablet Take 20 mg by mouth daily.   Yes [provider]  cetirizine (ZYRTEC) 10 MG tablet Take 10 mg by mouth daily.   Yes [provider]  cyclobenzaprine (FLEXERIL) 5 MG tablet Take 5 mg by mouth 2 (two) times daily as needed for muscle spasms.  11/26/19  Yes [provider]  cycloSPORINE (RESTASIS) 0.05 % ophthalmic emulsion Place 1 drop into both eyes 2 (two) times daily.   Yes [provider]  DULoxetine (CYMBALTA) 60 MG capsule Take 1 capsule (60 mg total) by mouth 2 (two) times daily. 11/10/19  Yes Cloria Spring, MD  hydrochlorothiazide (HYDRODIURIL) 25 MG tablet Take 25 mg by mouth daily.   Yes [provider]  levothyroxine (SYNTHROID) 150 MCG tablet Take 150 mcg by mouth daily before breakfast.    Yes [provider]  linaclotide (LINZESS) 145 MCG CAPS capsule Take 145 mcg by mouth daily.    Yes [provider]  lisinopril (ZESTRIL) 20 MG tablet Take 1 tablet (20 mg total) by mouth daily. 12/28/19  Yes Bacigalupo, Dionne Bucy, MD  Olopatadine HCl 0.2 % SOLN Apply 1 drop to eye daily. 12/31/19  Yes [provider]  pantoprazole (PROTONIX) 40 MG tablet Take 40 mg by mouth daily.   Yes [provider]  potassium chloride (KLOR-CON) 10 MEQ tablet Take 10 mEq by mouth 3 (three) times daily. 12/14/19  Yes [provider]  pregabalin (LYRICA) 100 MG capsule Take 100 mg by mouth 4 (four) times daily. 12/13/19  Yes [provider]  rOPINIRole (REQUIP) 2 MG tablet Take 2 mg by mouth 2 (two) times daily.   Yes [provider]  vitamin C (ASCORBIC ACID) 500 MG tablet Take 500 mg by mouth at bedtime.   Yes [provider]  warfarin (COUMADIN) 5 MG tablet Take 1 tablet (5 mg total) by mouth daily at 6 PM. Patient taking differently: Take 5-10 mg by mouth See admin instructions. Take 1 and 1/2 tablets on Tuesday and Thursday then take 1 tablet all the other days 08/18/19  Yes Dagoberto Ligas, PA-C  amphetamine-dextroamphetamine (ADDERALL) 30 MG tablet Take 1 tablet by mouth 2 (two) times daily. Patient not taking: Reported on 12/28/2019 11/10/19 11/09/20  Cloria Spring, MD  amphetamine-dextroamphetamine (ADDERALL)  30 MG tablet Take 1 tablet by mouth 2 (two) times daily. Patient not taking: Reported on 01/05/2020 11/10/19 11/09/20  Cloria Spring, MD  amphetamine-dextroamphetamine (ADDERALL) 30 MG tablet Take 1 tablet by mouth 2 (two) times daily. Patient not taking: Reported on 12/13/2019 11/10/19 11/09/20  Cloria Spring, MD  cephALEXin (KEFLEX) 500 MG capsule Take 1 capsule (500 mg total) by mouth 2 (two) times daily for 7 days. 01/06/20 01/13/20  Lorin Glass, PA-C    Allergies    Aripiprazole, Seroquel [quetiapine fumarate], Chlorpromazine, and Gabapentin  Review of Systems   Review of Systems  Constitutional: Negative for chills and fever.  Respiratory: Negative for shortness of breath.   Cardiovascular: Positive for leg swelling. Negative for chest pain.  Genitourinary: Negative for dysuria.  Musculoskeletal:       Pain in left leg  Skin: Positive for color change.  Neurological: Negative for weakness and headaches.  All other systems reviewed and are negative.   Physical Exam Updated Vital Signs BP 113/74   Pulse 64   Temp 98.2 F (36.8 C) (Oral)   Resp 18   LMP  (LMP Unknown)   SpO2 95%   Physical Exam Vitals and nursing note reviewed.  Constitutional:      General: She is not in acute distress.    Appearance: She is well-developed. She is not diaphoretic.  HENT:     Head: Normocephalic and atraumatic.  Eyes:     General: No scleral icterus.       Right eye: No discharge.         Left eye: No discharge.     Conjunctiva/sclera: Conjunctivae normal.  Cardiovascular:     Rate and Rhythm: Normal rate and regular rhythm.     Pulses:          Dorsalis pedis pulses are detected w/ Doppler on the left side.       Posterior tibial pulses are detected w/ Doppler on the left side.  Pulmonary:     Effort: Pulmonary effort is normal. No respiratory distress.     Breath sounds: No stridor.  Abdominal:     General: There is no distension.  Musculoskeletal:        General: No deformity.     Cervical back: Normal range of motion.     Comments: Proximal left calf compartments are soft and easily compressible.  There is generalized edema along the left distal calf extending into the foot and ankle.  Patient is unable to plantar flex with the left ankle.  Skin:    General: Skin is warm and dry.     Comments: There is significant ecchymosis extending from the left heel/foot approximately three quarters of the way up the left calf primarily on the posterior aspect.  There is overlying erythema with scant weeping of clear fluid. There is generalized induration along the posterior aspect of the left calf. No significant wounds visualized. No significant temperature difference between the left and right foot  Neurological:     Mental Status: She is alert.     Sensory: No sensory deficit (Sensation intact to light touch).     Motor: No abnormal muscle tone.  Psychiatric:        Behavior: Behavior normal.             ED Results / Procedures / Treatments   Labs (all labs ordered are listed, but only abnormal results are displayed) Labs Reviewed  CBC - Abnormal; Notable for the  following components:      Result Value   RDW 17.1 (*)    All other components within normal limits  COMPREHENSIVE METABOLIC PANEL - Abnormal; Notable for the following components:   Glucose, Bld 113 (*)    BUN 32 (*)    Creatinine, Ser 1.35 (*)    GFR calc non Af Amer 45 (*)    GFR calc  Af Amer 52 (*)    All other components within normal limits  CBG MONITORING, ED - Abnormal; Notable for the following components:   Glucose-Capillary 112 (*)    All other components within normal limits  LACTIC ACID, PLASMA    EKG None  Radiology VAS [place] LOWER EXTREMITY VENOUS (DVT) (ONLY [facility] & [facility])  Result Date: 01/05/2020  Lower Venous DVTStudy Indications: Pain.  Anticoagulation: Coumadin. Comparison Study: 11/25/18 previous Performing Technologist: Abram Sander RVS  Examination Guidelines: A complete evaluation includes B-mode imaging, spectral Doppler, color Doppler, and power Doppler as needed of all accessible portions of each vessel. Bilateral testing is considered an integral part of a complete examination. Limited examinations for reoccurring indications may be performed as noted. The reflux portion of the exam is performed with the patient in reverse Trendelenburg.  +-----+---------------+---------+-----------+----------+--------------+ RIGHTCompressibilityPhasicitySpontaneityPropertiesThrombus Aging +-----+---------------+---------+-----------+----------+--------------+ CFV  Full           Yes      Yes                                 +-----+---------------+---------+-----------+----------+--------------+   +---------+---------------+---------+-----------+----------+--------------+ LEFT     CompressibilityPhasicitySpontaneityPropertiesThrombus Aging +---------+---------------+---------+-----------+----------+--------------+ CFV                     Yes      Yes                  stent          +---------+---------------+---------+-----------+----------+--------------+ SFJ                                                   stent          +---------+---------------+---------+-----------+----------+--------------+ FV Prox  Full                                                        +---------+---------------+---------+-----------+----------+--------------+ FV  Mid   Full                                                        +---------+---------------+---------+-----------+----------+--------------+ FV DistalFull                                                        +---------+---------------+---------+-----------+----------+--------------+ PFV      Full                                                        +---------+---------------+---------+-----------+----------+--------------+  POP      Full           Yes      Yes                                 +---------+---------------+---------+-----------+----------+--------------+ PTV      Full                                                        +---------+---------------+---------+-----------+----------+--------------+ PERO                                                  Not visualized +---------+---------------+---------+-----------+----------+--------------+     Summary: RIGHT: - No evidence of common femoral vein obstruction.  LEFT: - There is no evidence of deep vein thrombosis in the lower extremity.  - No cystic structure found in the popliteal fossa.  *See table(s) above for measurements and observations. Electronically signed by Harold Barban MD on 01/05/2020 at 8:54:43 PM.    Final     Procedures Procedures (including critical care time)  Medications Ordered in ED Medications - No data to display  ED Course  I have reviewed the triage vital signs and the nursing notes.  Pertinent labs & imaging results that were available during my care of the patient were reviewed by me and considered in my medical decision making (see chart for details).  Clinical Course as of Jan 05 599  Thu Jan 06, 2020  0236 I spoke with Dr. Trula Slade of vascular who states this does not appear to be a vascular issue.  He will tell Dr. Donzetta Matters she was seen in the ED tonight.     [EH]    Clinical Course User Index [EH] Lorin Glass, PA-C   MDM Rules/Calculators/A&P                       Patient presents today from her primary care doctor's office for concern of decreased circulation to the left leg. She was recently seen at [name] where she was diagnosed with a left-sided ruptured Achilles tendon and calf hematoma by MRI. On my exam I am able to Doppler both DP and PT pulses on the left side.  There is no palpable temperature difference between the left and right foot.  Patient states she normally requires her pulses to be dopplered, chart review shows last time she was seen by vascular they documented 2+ pulses. Proximally in the left calf compartments are soft and easily compressible.  Spoke with vascular who states that this does not appear to be a vascular issue, recommended outpatient follow-up.  Patient does have mild erythema overlying the ecchymosis, and therefore will be treated with Keflex. She is advised to restart her warfarin with close follow-up.  She states that she has an appointment already scheduled in the next week for a PT/INR check along with follow-up with her podiatrist for her injury.  Return precautions were discussed with patient who states their understanding.  At the time of discharge patient denied any unaddressed complaints or concerns.  Patient is agreeable for discharge home.  Note: Portions of this report may have been transcribed using voice recognition software. Every effort was made to ensure accuracy; however, inadvertent computerized transcription errors may be present  Final Clinical Impression(s) / ED Diagnoses Final diagnoses:  Rupture of left Achilles tendon, initial encounter  Encounter for assessment for deep vein thrombosis (DVT)  May-Thurner syndrome    Rx / DC Orders ED Discharge Orders         Ordered    cephALEXin (KEFLEX) 500 MG capsule  2 times daily     01/06/20 0320           Lorin Glass, PA-C 01/06/20 0601    Ripley Fraise, MD 01/06/20 [phone]

## 2020-01-06 NOTE — ED Provider Notes (Signed)
   Calf is soft Distal pulses found by doppler (left DP/PT) Plan to consult vascular surgery May also need coverage for cellulitis    Ripley Fraise, MD 01/06/20 [phone]

## 2020-01-06 NOTE — ED Notes (Signed)
Doppler pluses present on left foot.

## 2020-01-06 NOTE — Discharge Instructions (Addendum)
Please start your warfarin.  Please make sure you are elevating your leg above your heart when possible and wearing compression stockings.  These will help significantly with your pain.  If you experience any concerning symptoms including, but not limited to, your left foot feeling cold, gray, dusky, your pain is significantly worsened and under control, or you have any other concerns please seek additional medical care and evaluation.

## 2020-01-06 NOTE — ED Notes (Signed)
Discharge instructions reviewed with pt. Pt verbalized understanding.   

## 2020-01-07 DIAGNOSIS — E039 Hypothyroidism, unspecified: Secondary | ICD-10-CM | POA: Diagnosis not present

## 2020-01-07 NOTE — Telephone Encounter (Signed)
Patient advised as below. Patient verbalizes understanding and is in agreement with treatment plan.  

## 2020-01-07 NOTE — Telephone Encounter (Signed)
I have reviewed the records from the ER.  She needs follow-up with podiatry for her Achilles tendon tear.  She needs to have her INR checked in our clinic next week.  We should keep our follow-up appointment that is already scheduled.

## 2020-01-07 NOTE — Telephone Encounter (Signed)
Please advise 

## 2020-01-08 DIAGNOSIS — E039 Hypothyroidism, unspecified: Secondary | ICD-10-CM | POA: Diagnosis not present

## 2020-01-09 DIAGNOSIS — E039 Hypothyroidism, unspecified: Secondary | ICD-10-CM | POA: Diagnosis not present

## 2020-01-10 DIAGNOSIS — E039 Hypothyroidism, unspecified: Secondary | ICD-10-CM | POA: Diagnosis not present

## 2020-01-10 DIAGNOSIS — S86092D Other specified injury of left Achilles tendon, subsequent encounter: Secondary | ICD-10-CM | POA: Diagnosis not present

## 2020-01-11 ENCOUNTER — Other Ambulatory Visit: Payer: Self-pay | Admitting: Podiatry

## 2020-01-11 ENCOUNTER — Other Ambulatory Visit: Payer: Self-pay | Admitting: Family Medicine

## 2020-01-11 DIAGNOSIS — L821 Other seborrheic keratosis: Secondary | ICD-10-CM | POA: Diagnosis not present

## 2020-01-11 DIAGNOSIS — L905 Scar conditions and fibrosis of skin: Secondary | ICD-10-CM | POA: Diagnosis not present

## 2020-01-11 DIAGNOSIS — L0291 Cutaneous abscess, unspecified: Secondary | ICD-10-CM | POA: Diagnosis not present

## 2020-01-11 DIAGNOSIS — E039 Hypothyroidism, unspecified: Secondary | ICD-10-CM | POA: Diagnosis not present

## 2020-01-11 DIAGNOSIS — Z872 Personal history of diseases of the skin and subcutaneous tissue: Secondary | ICD-10-CM | POA: Diagnosis not present

## 2020-01-11 MED ORDER — CETIRIZINE HCL 10 MG PO TABS: 10.0000 mg | ORAL_TABLET | Freq: Every day | ORAL | 1 refills | Status: DC

## 2020-01-11 NOTE — Telephone Encounter (Signed)
Copied from [place] [phone]. Topic: Quick Communication - Rx Refill/Question >> Jan 11, 2020 10:51 AM Leward Quan A wrote: Medication: cetirizine (ZYRTEC) 10 MG tablet  Has the patient contacted their pharmacy? Yes.   (Agent: If no, request that the patient contact the pharmacy for the refill.) (Agent: If yes, when and what did the pharmacy advise?)  Preferred Pharmacy (with phone number or street name): [facility], [place]  Phone:  [phone] Fax:  [phone]     Agent: Please be advised that RX refills may take up to 3 business days. We ask that you follow-up with your pharmacy.

## 2020-01-11 NOTE — Telephone Encounter (Signed)
Requested medication (s) are due for refill today: yes  Requested medication (s) are on the active medication list: yes  Last refill: 03/30/2019  Future visit scheduled: yes  Notes to clinic: historical provider     Requested Prescriptions  Pending Prescriptions Disp Refills   cetirizine (ZYRTEC) 10 MG tablet       Sig: Take 1 tablet (10 mg total) by mouth daily.      Ear, Nose, and Throat:  Antihistamines Passed - 01/11/2020 10:54 AM      Passed - Valid encounter within last 12 months    Recent Outpatient Visits           6 days ago Calf swelling   [facility] [place], Dionne Bucy, MD   2 weeks ago Type 2 diabetes mellitus with stage 3a chronic kidney disease, without long-term current use of insulin [facility])   [facility] [place], Dionne Bucy, MD   2 months ago Acquired hypothyroidism   [place], Dionne Bucy, MD       Future Appointments             Tomorrow Derek Jack, MD [place]

## 2020-01-12 ENCOUNTER — Encounter (HOSPITAL_COMMUNITY): Payer: Self-pay | Admitting: Hematology

## 2020-01-12 ENCOUNTER — Inpatient Hospital Stay (HOSPITAL_BASED_OUTPATIENT_CLINIC_OR_DEPARTMENT_OTHER): Payer: Medicaid Other | Admitting: Hematology

## 2020-01-12 ENCOUNTER — Other Ambulatory Visit: Payer: Self-pay

## 2020-01-12 VITALS — BP 129/66 | HR 77 | Temp 96.8°F | Resp 18 | Wt 275.0 lb

## 2020-01-12 DIAGNOSIS — I129 Hypertensive chronic kidney disease with stage 1 through stage 4 chronic kidney disease, or unspecified chronic kidney disease: Secondary | ICD-10-CM | POA: Diagnosis not present

## 2020-01-12 DIAGNOSIS — N183 Chronic kidney disease, stage 3 unspecified: Secondary | ICD-10-CM | POA: Diagnosis not present

## 2020-01-12 DIAGNOSIS — R200 Anesthesia of skin: Secondary | ICD-10-CM | POA: Diagnosis not present

## 2020-01-12 DIAGNOSIS — Z79899 Other long term (current) drug therapy: Secondary | ICD-10-CM | POA: Diagnosis not present

## 2020-01-12 DIAGNOSIS — Z7901 Long term (current) use of anticoagulants: Secondary | ICD-10-CM | POA: Diagnosis not present

## 2020-01-12 DIAGNOSIS — Z7982 Long term (current) use of aspirin: Secondary | ICD-10-CM | POA: Diagnosis not present

## 2020-01-12 DIAGNOSIS — E7800 Pure hypercholesterolemia, unspecified: Secondary | ICD-10-CM | POA: Diagnosis not present

## 2020-01-12 DIAGNOSIS — D649 Anemia, unspecified: Secondary | ICD-10-CM

## 2020-01-12 DIAGNOSIS — E1122 Type 2 diabetes mellitus with diabetic chronic kidney disease: Secondary | ICD-10-CM | POA: Diagnosis not present

## 2020-01-12 DIAGNOSIS — F419 Anxiety disorder, unspecified: Secondary | ICD-10-CM | POA: Diagnosis not present

## 2020-01-12 DIAGNOSIS — K219 Gastro-esophageal reflux disease without esophagitis: Secondary | ICD-10-CM | POA: Diagnosis not present

## 2020-01-12 DIAGNOSIS — R600 Localized edema: Secondary | ICD-10-CM | POA: Diagnosis not present

## 2020-01-12 DIAGNOSIS — G479 Sleep disorder, unspecified: Secondary | ICD-10-CM | POA: Diagnosis not present

## 2020-01-12 DIAGNOSIS — Z86718 Personal history of other venous thrombosis and embolism: Secondary | ICD-10-CM | POA: Diagnosis not present

## 2020-01-12 DIAGNOSIS — E039 Hypothyroidism, unspecified: Secondary | ICD-10-CM | POA: Diagnosis not present

## 2020-01-12 DIAGNOSIS — F319 Bipolar disorder, unspecified: Secondary | ICD-10-CM | POA: Diagnosis not present

## 2020-01-12 NOTE — Progress Notes (Signed)
Jane [name], [place] [address]   CLINIC:  Medical Oncology/Hematology  PCP:  Jane Crews, MD [address] [place] [address] [phone]   REASON FOR VISIT:  Normocytic anemia.  CURRENT THERAPY: Surveillance.   INTERVAL HISTORY:  Jane [name] 54 y.o. female seen for follow-up of normocytic anemia and kidney disease.  She apparently had recent foot injury.  She is taking antibiotic Keflex for possible cellulitis.  Denies any bleeding per rectum or melena.  Last Feraheme was on 10/21/2019.  She reported improvement in energy levels.  She also had a colonoscopy done in January of this year.  Appetite is 100%.  Energy levels are 50%.    REVIEW OF SYSTEMS:  Review of Systems  Cardiovascular: Positive for leg swelling.  Neurological: Positive for numbness.  Psychiatric/Behavioral: Positive for sleep disturbance.  All other systems reviewed and are negative.    PAST MEDICAL/SURGICAL HISTORY:  Past Medical History:  Diagnosis Date  . Anxiety   . Bipolar 1 disorder ([place])   . Bipolar disorder ([place])   . Chronic kidney disease    Stage 3 kidney disease;dx by Dr. Sinda [name].   . Constipation   . Depression   . Diabetes mellitus without complication ([place])   . Dyspnea    with exertion  . GERD (gastroesophageal reflux disease)   . Headache    migraines  . High cholesterol   . History of blood clots   . History of DVT (deep vein thrombosis)    left leg  . Hypertension    states under control with meds., has been on med. x 2 years  . Hypothyroidism   . Neuropathy   . Obsessive-compulsive disorder   . Peripheral vascular disease ([place])   . Respiratory failure requiring intubation ([place])   . Restless leg syndrome   . Thrombocytopenia ([place]) 09/15/2019  . Trigger thumb of left hand 01/2018  . Trigger thumb of right hand    Past Surgical History:  Procedure Laterality Date  . ABDOMINAL HYSTERECTOMY  06/2016     complete  . APPLICATION OF WOUND VAC Left 04/27/2019   Procedure: APPLICATION OF WOUND VAC LEFT GROIN;  Surgeon: Jane Sandy, MD;  Location: [facility];  Service: Vascular;  Laterality: Left;  . AV FISTULA PLACEMENT Left 11/24/2018   Procedure: ARTERIOVENOUS (AV) FISTULA CREATION LEFT SFA TO LEFT FEMORAL VEIN;  Surgeon: Jane Sandy, MD;  Location: [facility];  Service: Vascular;  Laterality: Left;  . BACK SURGERY    . CHOLECYSTECTOMY    . COLONOSCOPY WITH PROPOFOL N/A 11/22/2019   Procedure: COLONOSCOPY WITH PROPOFOL;  Surgeon: Jane Bellows, MD;  Location: [facility] ENDOSCOPY;  Service: Gastroenterology;  Laterality: N/A;  . FEMORAL ARTERY EXPLORATION  03/30/2019   Procedure: Left Common Femoral Artery and Vein Exploration;  Surgeon: Jane Sandy, MD;  Location: [facility];  Service: Vascular;;  . FEMORAL-FEMORAL BYPASS GRAFT Left 11/24/2018   Procedure: BYPASS GRAFT FEMORAL-FEMORAL VENOUS LEFT TO RIGHT PALMA PROCEDURE USING CRYOVEIN;  Surgeon: Jane Sandy, MD;  Location: [facility];  Service: Vascular;  Laterality: Left;  . GROIN DEBRIDEMENT Left 04/27/2019   Procedure: GROIN DEBRIDEMENT;  Surgeon: Jane Sandy, MD;  Location: [facility];  Service: Vascular;  Laterality: Left;  . INSERTION OF ILIAC STENT  03/30/2019   Procedure: Stent of left common, external iliac veins and left common femoral vein;  Surgeon: Jane Sandy, MD;  Location: [facility];  Service: Vascular;;  .  LOWER EXTREMITY VENOGRAPHY N/A 08/17/2018   Procedure: LOWER EXTREMITY VENOGRAPHY - Central Venogram;  Surgeon: Jane Sandy, MD;  Location: [facility];  Service: Cardiovascular;  Laterality: N/A;  . LOWER EXTREMITY VENOGRAPHY Bilateral 03/09/2019   Procedure: LOWER EXTREMITY VENOGRAPHY;  Surgeon: Jane Sandy, MD;  Location: [facility];  Service: Cardiovascular;  Laterality: Bilateral;  . LOWER EXTREMITY VENOGRAPHY Left 08/16/2019   Procedure:  LOWER EXTREMITY VENOGRAPHY;  Surgeon: Jane Sandy, MD;  Location: [facility];  Service: Cardiovascular;  Laterality: Left;  . LUMBAR FUSION  11/21/2000   L5-S1  . LUMBAR SPINE SURGERY     x 2 others  . PATCH ANGIOPLASTY Left 03/30/2019   Procedure: Patch Angioplasty of the Left Common Femoral Vein using Venosure Biologic patch;  Surgeon: Jane Sandy, MD;  Location: [facility];  Service: Vascular;  Laterality: Left;  . PERIPHERAL VASCULAR INTERVENTION Left 08/16/2019   Procedure: PERIPHERAL VASCULAR INTERVENTION;  Surgeon: Jane Sandy, MD;  Location: [facility];  Service: Cardiovascular;  Laterality: Left;  common femoral/femoral vein stent  . TRIGGER FINGER RELEASE Right 12/01/2017   Procedure: RELEASE TRIGGER FINGER/A-1 PULLEY RIGHT THUMB;  Surgeon: Leanora Cover, MD;  Location: [facility];  Service: Orthopedics;  Laterality: Right;  . TRIGGER FINGER RELEASE Left 01/26/2018   Procedure: LEFT TRIGGER THUMB RELEASE;  Surgeon: Leanora Cover, MD;  Location: [facility];  Service: Orthopedics;  Laterality: Left;  . ULTRASOUND GUIDANCE FOR VASCULAR ACCESS Right 03/30/2019   Procedure: Ultrasound-guided cannulation right internal jugular vein;  Surgeon: Jane Sandy, MD;  Location: [facility];  Service: Vascular;  Laterality: Right;     SOCIAL HISTORY:  Social History   Socioeconomic History  . Marital status: Divorced    Spouse name: Not on file  . Number of children: 3  . Years of education: Not on file  . Highest education level: Not on file  Occupational History  . Not on file  Tobacco Use  . Smoking status: Never Smoker  . Smokeless tobacco: Never Used  Substance and Sexual Activity  . Alcohol use: No  . Drug use: No  . Sexual activity: Not Currently    Partners: Male    Birth control/protection: Surgical  Other Topics Concern  . Not on file  Social History Narrative  . Not on file   Social  Determinants of Health   Financial Resource Strain:   . Difficulty of Paying Living Expenses: Not on file  Food Insecurity:   . Worried About Charity fundraiser in the Last Year: Not on file  . Ran Out of Food in the Last Year: Not on file  Transportation Needs:   . Lack of Transportation (Medical): Not on file  . Lack of Transportation (Non-Medical): Not on file  Physical Activity:   . Days of Exercise per Week: Not on file  . Minutes of Exercise per Session: Not on file  Stress:   . Feeling of Stress : Not on file  Social Connections:   . Frequency of Communication with Friends and Family: Not on file  . Frequency of Social Gatherings with Friends and Family: Not on file  . Attends Religious Services: Not on file  . Active Member of Clubs or Organizations: Not on file  . Attends Archivist Meetings: Not on file  . Marital Status: Not on file  Intimate Partner Violence:   . Fear of Current or Ex-Partner: Not on  file  . Emotionally Abused: Not on file  . Physically Abused: Not on file  . Sexually Abused: Not on file    FAMILY HISTORY:  Family History  Problem Relation Age of Onset  . Heart disease Mother   . Hyperlipidemia Mother   . Hypertension Mother   . Bipolar disorder Mother   . Diabetes Father   . Heart disease Father   . Hyperlipidemia Father   . Hypertension Father   . Drug abuse Daughter   . ADD / ADHD Daughter   . Drug abuse Daughter   . Anxiety disorder Daughter   . Bipolar disorder Daughter   . Hypertension Sister   . Hypertension Brother   . Hyperlipidemia Brother   . Heart disease Brother   . Bipolar disorder Maternal Aunt   . Suicidality Maternal Aunt     CURRENT MEDICATIONS:  Outpatient Encounter Medications as of 01/12/2020  Medication Sig  . amphetamine-dextroamphetamine (ADDERALL) 30 MG tablet Take 1 tablet by mouth 2 (two) times daily.  Marland Kitchen aspirin EC 81 MG tablet Take 81 mg by mouth daily.  Marland Kitchen atorvastatin (LIPITOR) 20 MG tablet  Take 20 mg by mouth daily.  . cephALEXin (KEFLEX) 500 MG capsule Take 1 capsule (500 mg total) by mouth 2 (two) times daily for 7 days.  . cetirizine (ZYRTEC) 10 MG tablet Take 1 tablet (10 mg total) by mouth daily.  . cyclobenzaprine (FLEXERIL) 5 MG tablet Take 10 mg by mouth 3 (three) times daily.   . cycloSPORINE (RESTASIS) 0.05 % ophthalmic emulsion Place 1 drop into both eyes 2 (two) times daily.  . DULoxetine (CYMBALTA) 60 MG capsule Take 1 capsule (60 mg total) by mouth 2 (two) times daily.  . hydrochlorothiazide (HYDRODIURIL) 25 MG tablet Take 25 mg by mouth daily.  Marland Kitchen levothyroxine (SYNTHROID) 150 MCG tablet Take 150 mcg by mouth daily before breakfast.   . linaclotide (LINZESS) 145 MCG CAPS capsule Take 145 mcg by mouth daily.   Marland Kitchen lisinopril (ZESTRIL) 20 MG tablet Take 1 tablet (20 mg total) by mouth daily.  . Olopatadine HCl 0.2 % SOLN Apply 1 drop to eye daily.  . pantoprazole (PROTONIX) 40 MG tablet Take 40 mg by mouth daily.  . potassium chloride (KLOR-CON) 10 MEQ tablet Take 10 mEq by mouth 3 (three) times daily.  . pregabalin (LYRICA) 100 MG capsule Take 100 mg by mouth 4 (four) times daily.  Marland Kitchen rOPINIRole (REQUIP) 2 MG tablet Take 2 mg by mouth 2 (two) times daily.  . vitamin C (ASCORBIC ACID) 500 MG tablet Take 500 mg by mouth at bedtime.  Marland Kitchen warfarin (COUMADIN) 5 MG tablet Take 1 tablet (5 mg total) by mouth daily at 6 PM. (Patient taking differently: Take 5-10 mg by mouth See admin instructions. Take 1 and 1/2 tablets on Tuesday and Thursday then take 1 tablet all the other days)  . ALPRAZolam (XANAX) 1 MG tablet Take 1 tablet (1 mg total) by mouth 2 (two) times daily as needed for anxiety. (Patient not taking: Reported on 01/12/2020)  . [DISCONTINUED] amphetamine-dextroamphetamine (ADDERALL) 30 MG tablet Take 1 tablet by mouth 2 (two) times daily. (Patient not taking: Reported on 12/28/2019)  . [DISCONTINUED] amphetamine-dextroamphetamine (ADDERALL) 30 MG tablet Take 1 tablet by  mouth 2 (two) times daily. (Patient not taking: Reported on 01/05/2020)   No facility-administered encounter medications on file as of 01/12/2020.    ALLERGIES:  Allergies  Allergen Reactions  . Aripiprazole Other (See Comments)    BECOMES  VIOLENT   .  Seroquel [Quetiapine Fumarate] Other (See Comments)    BECOMES VIOLENT  . Chlorpromazine Other (See Comments)    SEVERE ANXIETY   . Gabapentin Other (See Comments)    NIGHTMARES      PHYSICAL EXAM:  ECOG Performance status: 1  Vitals:   01/12/20 1434  BP: 129/66  Pulse: 77  Resp: 18  Temp: (!) 96.8 F (36 C)  SpO2: 96%   Filed Weights   01/12/20 1434  Weight: 275 lb (124.7 kg)    Physical Exam Vitals reviewed.  Constitutional:      Appearance: Normal appearance.  Cardiovascular:     Rate and Rhythm: Normal rate and regular rhythm.     Heart sounds: Normal heart sounds.  Pulmonary:     Effort: Pulmonary effort is normal.     Breath sounds: Normal breath sounds.  Abdominal:     General: There is no distension.     Palpations: Abdomen is soft. There is no mass.  Skin:    General: Skin is warm.  Neurological:     General: No focal deficit present.     Mental Status: She is alert and oriented to person, place, and time.  Psychiatric:        Mood and Affect: Mood normal.        Behavior: Behavior normal.      LABORATORY DATA:  I have reviewed the labs as listed.  CBC    Component Value Date/Time   WBC 7.7 01/05/2020 1814   RBC 4.70 01/05/2020 1814   HGB 14.2 01/05/2020 1814   HCT 43.5 01/05/2020 1814   PLT 158 01/05/2020 1814   MCV 92.6 01/05/2020 1814   MCH 30.2 01/05/2020 1814   MCHC 32.6 01/05/2020 1814   RDW 17.1 (H) 01/05/2020 1814   LYMPHSABS 1.3 01/04/2020 1338   MONOABS 0.6 01/04/2020 1338   EOSABS 0.1 01/04/2020 1338   BASOSABS 0.0 01/04/2020 1338   CMP Latest Ref Rng & Units 01/05/2020 10/25/2019 08/18/2019  Glucose 70 - 99 mg/dL 113(H) 82 131(H)  BUN 6 - 20 mg/dL 32(H) 28(H) 20    Creatinine 0.44 - 1.00 mg/dL 1.35(H) 1.12(H) 0.97  Sodium 135 - 145 mmol/L 138 142 138  Potassium 3.5 - 5.1 mmol/L 4.4 3.8 3.7  Chloride 98 - 111 mmol/L 100 100 101  CO2 22 - 32 mmol/L 26 28 25   Calcium 8.9 - 10.3 mg/dL 9.2 9.5 9.3  Total Protein 6.5 - 8.1 g/dL 7.2 7.0 -  Total Bilirubin 0.3 - 1.2 mg/dL 0.7 0.3 -  Alkaline Phos 38 - 126 U/L 108 154(H) -  AST 15 - 41 U/L 23 14 -  ALT 0 - 44 U/L 31 17 -       DIAGNOSTIC IMAGING:  Last ultrasound on 09/18/2018 was negative for DVT.    ASSESSMENT & PLAN:   Normocytic anemia 1.  Normocytic anemia: -Colonoscopy on 11/22/2019 shows 3 small polyps in the ascending colon and the cecum.  Nonbleeding internal hemorrhoids.  Pathology consistent with tubular adenoma. -She has taken iron pills in the past without much improvement. -She had a history of blood transfusions in the past. -Combination anemia from CKD and iron deficiency. -Feraheme on 10/14/2019 and 10/21/2019. -We reviewed labs from 01/04/2020.  Hemoglobin improved to 14.2.  Ferritin is 96, up from 22.  Percent saturation is 23 up from 21. -She does not require any parenteral iron therapy at this time.  We will see her back in 3 months with repeat labs.  2.  Recurrent left leg DVT: -She had first episode of left leg DVT in 2014.  Recurrent episode was while she was taking Xarelto. -She is currently on Coumadin.      Orders placed this encounter:  Orders Placed This Encounter  Procedures  . CBC with Differential/Platelet  . Comprehensive metabolic panel  . Iron and TIBC  . Ferritin  . Vitamin B12  . Folate      Derek Jack, MD [place] [phone]

## 2020-01-12 NOTE — Patient Instructions (Signed)
[facility] Discharge Instructions  You were seen today by Dr. Katragadda. He went over your recent lab results. He will see you back in 3 months for labs and follow up.   Thank you for choosing [facility] at [facility] to provide your oncology and hematology care.  To afford each patient quality time with our provider, please arrive at least 15 minutes before your scheduled appointment time.   If you have a lab appointment with the Cancer Center please come in thru the  Main Entrance and check in at the main information desk  You need to re-schedule your appointment should you arrive 10 or more minutes late.  We strive to give you quality time with our providers, and arriving late affects you and other patients whose appointments are after yours.  Also, if you no show three or more times for appointments you may be dismissed from the clinic at the providers discretion.     Again, thank you for choosing [facility].  Our hope is that these requests will decrease the amount of time that you wait before being seen by our physicians.       _____________________________________________________________  Should you have questions after your visit to [facility], please contact our office at [phone] between the hours of 8:00 a.m. and 4:30 p.m.  Voicemails left after 4:00 p.m. will not be returned until the following business day.  For prescription refill requests, have your pharmacy contact our office and allow 72 hours.    Cancer Center Support Programs:   > Cancer Support Group  2nd Tuesday of the month 1pm-2pm, Journey Room    

## 2020-01-12 NOTE — Assessment & Plan Note (Signed)
1.  Normocytic anemia: -Colonoscopy on 11/22/2019 shows 3 small polyps in the ascending colon and the cecum.  Nonbleeding internal hemorrhoids.  Pathology consistent with tubular adenoma. -She has taken iron pills in the past without much improvement. -She had a history of blood transfusions in the past. -Combination anemia from CKD and iron deficiency. -Feraheme on 10/14/2019 and 10/21/2019. -We reviewed labs from 01/04/2020.  Hemoglobin improved to 14.2.  Ferritin is 96, up from 22.  Percent saturation is 23 up from 21. -She does not require any parenteral iron therapy at this time.  We will see her back in 3 months with repeat labs.  2.  Recurrent left leg DVT: -She had first episode of left leg DVT in 2014.  Recurrent episode was while she was taking Xarelto. -She is currently on Coumadin.

## 2020-01-13 ENCOUNTER — Ambulatory Visit: Payer: Medicaid Other | Admitting: Student in an Organized Health Care Education/Training Program

## 2020-01-13 DIAGNOSIS — E039 Hypothyroidism, unspecified: Secondary | ICD-10-CM | POA: Diagnosis not present

## 2020-01-14 DIAGNOSIS — E039 Hypothyroidism, unspecified: Secondary | ICD-10-CM | POA: Diagnosis not present

## 2020-01-15 DIAGNOSIS — E039 Hypothyroidism, unspecified: Secondary | ICD-10-CM | POA: Diagnosis not present

## 2020-01-16 DIAGNOSIS — E039 Hypothyroidism, unspecified: Secondary | ICD-10-CM | POA: Diagnosis not present

## 2020-01-17 DIAGNOSIS — E039 Hypothyroidism, unspecified: Secondary | ICD-10-CM | POA: Diagnosis not present

## 2020-01-18 ENCOUNTER — Ambulatory Visit (INDEPENDENT_AMBULATORY_CARE_PROVIDER_SITE_OTHER): Payer: Medicaid Other

## 2020-01-18 ENCOUNTER — Other Ambulatory Visit: Payer: Self-pay

## 2020-01-18 DIAGNOSIS — Z86718 Personal history of other venous thrombosis and embolism: Secondary | ICD-10-CM

## 2020-01-18 DIAGNOSIS — E039 Hypothyroidism, unspecified: Secondary | ICD-10-CM | POA: Diagnosis not present

## 2020-01-18 LAB — POCT INR
INR: 2.4 (ref 2.0–3.0)
PT: 29

## 2020-01-18 NOTE — Patient Instructions (Signed)
Description   Current dose: 5 mg daily, except 7.5 mg on Tuesday's and Thursday's. No changes. Return in 4 weeks.

## 2020-01-19 DIAGNOSIS — E039 Hypothyroidism, unspecified: Secondary | ICD-10-CM | POA: Diagnosis not present

## 2020-01-20 ENCOUNTER — Ambulatory Visit (INDEPENDENT_AMBULATORY_CARE_PROVIDER_SITE_OTHER): Payer: Medicaid Other | Admitting: Family Medicine

## 2020-01-20 ENCOUNTER — Encounter: Payer: Self-pay | Admitting: Family Medicine

## 2020-01-20 ENCOUNTER — Other Ambulatory Visit: Payer: Self-pay

## 2020-01-20 VITALS — BP 114/74 | HR 90 | Temp 97.1°F | Resp 16 | Ht 70.0 in | Wt 281.0 lb

## 2020-01-20 DIAGNOSIS — E1122 Type 2 diabetes mellitus with diabetic chronic kidney disease: Secondary | ICD-10-CM

## 2020-01-20 DIAGNOSIS — E039 Hypothyroidism, unspecified: Secondary | ICD-10-CM

## 2020-01-20 DIAGNOSIS — N1831 Chronic kidney disease, stage 3a: Secondary | ICD-10-CM | POA: Diagnosis not present

## 2020-01-20 DIAGNOSIS — Z0000 Encounter for general adult medical examination without abnormal findings: Secondary | ICD-10-CM | POA: Diagnosis not present

## 2020-01-20 DIAGNOSIS — E1121 Type 2 diabetes mellitus with diabetic nephropathy: Secondary | ICD-10-CM

## 2020-01-20 DIAGNOSIS — B370 Candidal stomatitis: Secondary | ICD-10-CM | POA: Diagnosis not present

## 2020-01-20 DIAGNOSIS — Z1231 Encounter for screening mammogram for malignant neoplasm of breast: Secondary | ICD-10-CM | POA: Diagnosis not present

## 2020-01-20 MED ORDER — NYSTATIN 100000 UNIT/ML MT SUSP: 5.0000 mL | Freq: Four times a day (QID) | OROMUCOSAL | 0 refills | Status: DC

## 2020-01-20 MED ORDER — HYDROCHLOROTHIAZIDE 25 MG PO TABS: 25.0000 mg | ORAL_TABLET | Freq: Every day | ORAL | 3 refills | Status: DC

## 2020-01-20 MED ORDER — PANTOPRAZOLE SODIUM 40 MG PO TBEC: 40.0000 mg | DELAYED_RELEASE_TABLET | Freq: Every day | ORAL | 3 refills | Status: DC

## 2020-01-20 MED ORDER — POTASSIUM CHLORIDE CRYS ER 10 MEQ PO TBCR: 10.0000 meq | EXTENDED_RELEASE_TABLET | Freq: Three times a day (TID) | ORAL | 3 refills | Status: DC

## 2020-01-20 MED ORDER — ROPINIROLE HCL 2 MG PO TABS: 2.0000 mg | ORAL_TABLET | Freq: Two times a day (BID) | ORAL | 3 refills | Status: DC

## 2020-01-20 MED ORDER — LEVOTHYROXINE SODIUM 150 MCG PO TABS: 150.0000 ug | ORAL_TABLET | Freq: Every day | ORAL | 3 refills | Status: DC

## 2020-01-20 MED ORDER — LINACLOTIDE 145 MCG PO CAPS: 145.0000 ug | ORAL_CAPSULE | Freq: Every day | ORAL | 3 refills | Status: DC

## 2020-01-20 MED ORDER — ATORVASTATIN CALCIUM 20 MG PO TABS: 20.0000 mg | ORAL_TABLET | Freq: Every day | ORAL | 3 refills | Status: DC

## 2020-01-20 NOTE — Patient Instructions (Signed)
Preventive Care 69-54 Years Old, Female Preventive care refers to visits with your health care provider and lifestyle choices that can promote health and wellness. This includes:  A yearly physical exam. This may also be called an annual well check.  Regular dental visits and eye exams.  Immunizations.  Screening for certain conditions.  Healthy lifestyle choices, such as eating a healthy diet, getting regular exercise, not using drugs or products that contain nicotine and tobacco, and limiting alcohol use. What can I expect for my preventive care visit? Physical exam Your health care provider will check your:  Height and weight. This may be used to calculate body mass index (BMI), which tells if you are at a healthy weight.  Heart rate and blood pressure.  Skin for abnormal spots. Counseling Your health care provider may ask you questions about your:  Alcohol, tobacco, and drug use.  Emotional well-being.  Home and relationship well-being.  Sexual activity.  Eating habits.  Work and work Statistician.  Method of birth control.  Menstrual cycle.  Pregnancy history. What immunizations do I need?  Influenza (flu) vaccine  This is recommended every year. Tetanus, diphtheria, and pertussis (Tdap) vaccine  You may need a Td booster every 10 years. Varicella (chickenpox) vaccine  You may need this if you have not been vaccinated. Zoster (shingles) vaccine  You may need this after age 40. Measles, mumps, and rubella (MMR) vaccine  You may need at least one dose of MMR if you were born in 1957 or later. You may also need a second dose. Pneumococcal conjugate (PCV13) vaccine  You may need this if you have certain conditions and were not previously vaccinated. Pneumococcal polysaccharide (PPSV23) vaccine  You may need one or two doses if you smoke cigarettes or if you have certain conditions. Meningococcal conjugate (MenACWY) vaccine  You may need this if you  have certain conditions. Hepatitis A vaccine  You may need this if you have certain conditions or if you travel or work in places where you may be exposed to hepatitis A. Hepatitis B vaccine  You may need this if you have certain conditions or if you travel or work in places where you may be exposed to hepatitis B. Haemophilus influenzae type b (Hib) vaccine  You may need this if you have certain conditions. Human papillomavirus (HPV) vaccine  If recommended by your health care provider, you may need three doses over 6 months. You may receive vaccines as individual doses or as more than one vaccine together in one shot (combination vaccines). Talk with your health care provider about the risks and benefits of combination vaccines. What tests do I need? Blood tests  Lipid and cholesterol levels. These may be checked every 5 years, or more frequently if you are over 79 years old.  Hepatitis C test.  Hepatitis B test. Screening  Lung cancer screening. You may have this screening every year starting at age 30 if you have a 30-pack-year history of smoking and currently smoke or have quit within the past 15 years.  Colorectal cancer screening. All adults should have this screening starting at age 52 and continuing until age 66. Your health care provider may recommend screening at age 33 if you are at increased risk. You will have tests every 1-10 years, depending on your results and the type of screening test.  Diabetes screening. This is done by checking your blood sugar (glucose) after you have not eaten for a while (fasting). You may have this  done every 1-3 years.  Mammogram. This may be done every 1-2 years. Talk with your health care provider about when you should start having regular mammograms. This may depend on whether you have a family history of breast cancer.  BRCA-related cancer screening. This may be done if you have a family history of breast, ovarian, tubal, or peritoneal  cancers.  Pelvic exam and Pap test. This may be done every 3 years starting at age 52. Starting at age 59, this may be done every 5 years if you have a Pap test in combination with an HPV test. Other tests  Sexually transmitted disease (STD) testing.  Bone density scan. This is done to screen for osteoporosis. You may have this scan if you are at high risk for osteoporosis. Follow these instructions at home: Eating and drinking  Eat a diet that includes fresh fruits and vegetables, whole grains, lean protein, and low-fat dairy.  Take vitamin and mineral supplements as recommended by your health care provider.  Do not drink alcohol if: ? Your health care provider tells you not to drink. ? You are pregnant, may be pregnant, or are planning to become pregnant.  If you drink alcohol: ? Limit how much you have to 0-1 drink a day. ? Be aware of how much alcohol is in your drink. In the [place] one drink equals one 12 oz bottle of beer (355 mL), one 5 oz glass of wine (148 mL), or one 1 oz glass of hard liquor (44 mL). Lifestyle  Take daily care of your teeth and gums.  Stay active. Exercise for at least 30 minutes on 5 or more days each week.  Do not use any products that contain nicotine or tobacco, such as cigarettes, e-cigarettes, and chewing tobacco. If you need help quitting, ask your health care provider.  If you are sexually active, practice safe sex. Use a condom or other form of birth control (contraception) in order to prevent pregnancy and STIs (sexually transmitted infections).  If told by your health care provider, take low-dose aspirin daily starting at age 53. What's next?  Visit your health care provider once a year for a well check visit.  Ask your health care provider how often you should have your eyes and teeth checked.  Stay up to date on all vaccines. This information is not intended to replace advice given to you by your health care provider. Make sure you  discuss any questions you have with your health care provider. Document Revised: 07/02/2018 Document Reviewed: 07/02/2018 Elsevier Patient Education  2020 [employer].

## 2020-01-20 NOTE — Assessment & Plan Note (Signed)
Likely due to recent antibiotic use Start nystatin swish and swallow 4 times daily until resolution

## 2020-01-20 NOTE — Assessment & Plan Note (Signed)
Previously well controlled, but patient reports she is having more hair loss which is typically symptomatic for her when her thyroid is uncontrolled Recheck TSH Continue current Synthroid dose pending results

## 2020-01-20 NOTE — Assessment & Plan Note (Addendum)
Discussed importance of healthy weight management Discussed diet and exercise  Referral to healthy weight management clinic in [place]

## 2020-01-20 NOTE — Assessment & Plan Note (Addendum)
ROI sent for previous eye exam Pneumovax today

## 2020-01-20 NOTE — Progress Notes (Signed)
Patient: Jane [name], Female    DOB: [birth year], 54 y.o.   MRN: [id number] Visit Date: 01/20/2020  Today's Provider: Lavon Paganini, MD   Chief Complaint  Patient presents with  . Annual Exam   Subjective:     Annual physical exam Jane [name] is a 54 y.o. female who presents today for health maintenance and complete physical. She feels well. She reports exercising no. She reports she is sleeping fairly well. Pneumococcal vaccine-Patient refuse Pap-patient reports that she has had a hysterectomy, but is getting pap done with [facility], in [place], [place] exam[place], [place].  -----------------------------------------------------------------   Review of Systems  Constitutional: Positive for activity change, appetite change and fatigue.  HENT: Negative.   Eyes: Negative.   Respiratory: Negative.   Cardiovascular: Positive for leg swelling.  Gastrointestinal: Negative.   Endocrine: Positive for polydipsia.  Genitourinary: Positive for difficulty urinating.  Musculoskeletal: Positive for arthralgias and back pain.  Skin: Negative.   Allergic/Immunologic: Negative.   Neurological: Negative.   Hematological: Negative.   Psychiatric/Behavioral: Negative.     Social History      She  reports that she has never smoked. She has never used smokeless tobacco. She reports that she does not drink alcohol or use drugs.       Social History   Socioeconomic History  . Marital status: Divorced    Spouse name: Not on file  . Number of children: 3  . Years of education: Not on file  . Highest education level: Not on file  Occupational History  . Not on file  Tobacco Use  . Smoking status: Never Smoker  . Smokeless tobacco: Never Used  Substance and Sexual Activity  . Alcohol use: No  . Drug use: No  . Sexual activity: Not Currently    Partners: Male    Birth control/protection: Surgical  Other Topics Concern  . Not on file  Social History  Narrative  . Not on file   Social Determinants of Health   Financial Resource Strain:   . Difficulty of Paying Living Expenses:   Food Insecurity:   . Worried About Charity fundraiser in the Last Year:   . Arboriculturist in the Last Year:   Transportation Needs:   . Film/video editor (Medical):   Marland Kitchen Lack of Transportation (Non-Medical):   Physical Activity:   . Days of Exercise per Week:   . Minutes of Exercise per Session:   Stress:   . Feeling of Stress :   Social Connections:   . Frequency of Communication with Friends and Family:   . Frequency of Social Gatherings with Friends and Family:   . Attends Religious Services:   . Active Member of Clubs or Organizations:   . Attends Archivist Meetings:   Marland Kitchen Marital Status:     Past Medical History:  Diagnosis Date  . Anxiety   . Bipolar 1 disorder ([place])   . Bipolar disorder ([place])   . Chronic kidney disease    Stage 3 kidney disease;dx by Dr. Sinda Du.   . Constipation   . Depression   . Diabetes mellitus without complication ([place])   . Dyspnea    with exertion  . GERD (gastroesophageal reflux disease)   . Headache    migraines  . High cholesterol   . History of blood clots   . History of DVT (deep vein thrombosis)    left leg  . Hypertension  states under control with meds., has been on med. x 2 years  . Hypothyroidism   . Neuropathy   . Obsessive-compulsive disorder   . Peripheral vascular disease ([place])   . Respiratory failure requiring intubation ([place])   . Restless leg syndrome   . Thrombocytopenia ([place]) 09/15/2019  . Trigger thumb of left hand 01/2018  . Trigger thumb of right hand      Patient Active Problem List   Diagnosis Date Noted  . Morbid obesity ([place]) 10/25/2019  . History of back surgery 10/25/2019  . Peripheral neuropathy 10/25/2019  . Restless leg syndrome 10/25/2019  . Chronic bilateral low back pain with bilateral sciatica 10/25/2019  . Essential hypertension  10/25/2019  . Type 2 diabetes mellitus with stage 3 chronic kidney disease, without long-term current use of insulin ([place]) 10/25/2019  . Chronic anticoagulation 10/25/2019  . Hyperlipidemia associated with type 2 diabetes mellitus ([place]) 10/25/2019  . CKD stage 3 due to type 2 diabetes mellitus ([place]) 10/25/2019  . GERD (gastroesophageal reflux disease) 10/25/2019  . Allergic rhinitis 10/25/2019  . Chronic constipation 10/25/2019  . Hypothyroidism   . Normocytic anemia 09/15/2019  . Wound of left groin 04/27/2019  . 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 Chronic venous insufficiency 11/21/2017  . Displacement of lumbar intervertebral disc without myelopathy 07/14/2017  . Radicular low back pain 05/25/2014  . Depression, major, recurrent ([place]) 06/17/2013  . OCD (obsessive compulsive disorder) 11/20/2012  . 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 Bipolar 1 disorder ([place]) 09/25/2012  . History of recurrent deep vein thrombosis (DVT) 08/18/2012    Past Surgical History:  Procedure Laterality Date  . ABDOMINAL HYSTERECTOMY  06/2016   complete  . APPLICATION OF WOUND VAC Left 04/27/2019   Procedure: APPLICATION OF WOUND VAC LEFT GROIN;  Surgeon: Waynetta Sandy, MD;  Location: [facility];  Service: Vascular;  Laterality: Left;  . AV FISTULA PLACEMENT Left 11/24/2018   Procedure: ARTERIOVENOUS (AV) FISTULA CREATION LEFT SFA TO LEFT FEMORAL VEIN;  Surgeon: Waynetta Sandy, MD;  Location: [facility];  Service: Vascular;  Laterality: Left;  . BACK SURGERY    . CHOLECYSTECTOMY    . COLONOSCOPY WITH PROPOFOL N/A 11/22/2019   Procedure: COLONOSCOPY WITH PROPOFOL;  Surgeon: [name], MD;  Location: [facility];  Service: Gastroenterology;  Laterality: N/A;  . FEMORAL ARTERY EXPLORATION  03/30/2019   Procedure: Left Common Femoral Artery and Vein Exploration;  Surgeon: Waynetta Sandy, MD;  Location: [facility];  Service: Vascular;;  .  FEMORAL-FEMORAL BYPASS GRAFT Left 11/24/2018   Procedure: BYPASS GRAFT FEMORAL-FEMORAL VENOUS LEFT TO RIGHT PALMA PROCEDURE USING CRYOVEIN;  Surgeon: Waynetta Sandy, MD;  Location: [facility];  Service: Vascular;  Laterality: Left;  . GROIN DEBRIDEMENT Left 04/27/2019   Procedure: GROIN DEBRIDEMENT;  Surgeon: Waynetta Sandy, MD;  Location: [facility];  Service: Vascular;  Laterality: Left;  . INSERTION OF ILIAC STENT  03/30/2019   Procedure: Stent of left common, external iliac veins and left common femoral vein;  Surgeon: Waynetta Sandy, MD;  Location: [facility];  Service: Vascular;;  . LOWER EXTREMITY VENOGRAPHY N/A 08/17/2018   Procedure: LOWER EXTREMITY VENOGRAPHY - Central Venogram;  Surgeon: Waynetta Sandy, MD;  Location: [facility];  Service: Cardiovascular;  Laterality: N/A;  . LOWER EXTREMITY VENOGRAPHY Bilateral 03/09/2019   Procedure: LOWER EXTREMITY VENOGRAPHY;  Surgeon: Waynetta Sandy, MD;  Location: [facility];  Service: Cardiovascular;  Laterality: Bilateral;  .  LOWER EXTREMITY VENOGRAPHY Left 08/16/2019   Procedure: LOWER EXTREMITY VENOGRAPHY;  Surgeon: Waynetta Sandy, MD;  Location: [facility];  Service: Cardiovascular;  Laterality: Left;  . LUMBAR FUSION  11/21/2000   L5-S1  . LUMBAR SPINE SURGERY     x 2 others  . PATCH ANGIOPLASTY Left 03/30/2019   Procedure: Patch Angioplasty of the Left Common Femoral Vein using Venosure Biologic patch;  Surgeon: Waynetta Sandy, MD;  Location: [facility];  Service: Vascular;  Laterality: Left;  . PERIPHERAL VASCULAR INTERVENTION Left 08/16/2019   Procedure: PERIPHERAL VASCULAR INTERVENTION;  Surgeon: Waynetta Sandy, MD;  Location: [facility];  Service: Cardiovascular;  Laterality: Left;  common femoral/femoral vein stent  . TRIGGER FINGER RELEASE Right 12/01/2017   Procedure: RELEASE TRIGGER FINGER/A-1 PULLEY RIGHT THUMB;  Surgeon: Leanora Cover,  MD;  Location: [facility];  Service: Orthopedics;  Laterality: Right;  . TRIGGER FINGER RELEASE Left 01/26/2018   Procedure: LEFT TRIGGER THUMB RELEASE;  Surgeon: Leanora Cover, MD;  Location: [facility];  Service: Orthopedics;  Laterality: Left;  . ULTRASOUND GUIDANCE FOR VASCULAR ACCESS Right 03/30/2019   Procedure: Ultrasound-guided cannulation right internal jugular vein;  Surgeon: Waynetta Sandy, MD;  Location: [facility];  Service: Vascular;  Laterality: Right;    Family History        Family Status  Relation Name Status  . Mother  Deceased  . Father  Deceased  . Daughter Amy Ernesto Rutherford  . Daughter Landscape architect  . Daughter Wardell Honour  . MGF  Deceased  . MGM  Deceased  . PGF  Deceased  . PGM  Deceased  . Sister  (Not Specified)  . Brother  (Not Specified)  . Mat Aunt  (Not Specified)        Her family history includes ADD / ADHD in her daughter; Anxiety disorder in her daughter; Bipolar disorder in her daughter, maternal aunt, and mother; Diabetes in her father; Drug abuse in her daughter and daughter; Heart disease in her brother, father, and mother; Hyperlipidemia in her brother, father, and mother; Hypertension in her brother, father, mother, and sister; Suicidality in her maternal aunt.      Allergies  Allergen Reactions  . Aripiprazole Other (See Comments)    BECOMES  VIOLENT   . Seroquel [Quetiapine Fumarate] Other (See Comments)    BECOMES VIOLENT  . Chlorpromazine Other (See Comments)    SEVERE ANXIETY   . Gabapentin Other (See Comments)    NIGHTMARES      Current Outpatient Medications:  .  ALPRAZolam (XANAX) 1 MG tablet, Take 1 tablet (1 mg total) by mouth 2 (two) times daily as needed for anxiety., Disp: 60 tablet, Rfl: 2 .  amphetamine-dextroamphetamine (ADDERALL) 30 MG tablet, Take 1 tablet by mouth 2 (two) times daily., Disp: 60 tablet, Rfl: 0 .  aspirin EC 81 MG tablet, Take 81 mg by mouth daily., Disp: , Rfl:  .   atorvastatin (LIPITOR) 20 MG tablet, Take 20 mg by mouth daily., Disp: , Rfl:  .  cetirizine (ZYRTEC) 10 MG tablet, Take 1 tablet (10 mg total) by mouth daily., Disp: 90 tablet, Rfl: 1 .  cyclobenzaprine (FLEXERIL) 5 MG tablet, Take 10 mg by mouth 3 (three) times daily. , Disp: , Rfl:  .  cycloSPORINE (RESTASIS) 0.05 % ophthalmic emulsion, Place 1 drop into both eyes 2 (two) times daily., Disp: , Rfl:  .  DULoxetine (CYMBALTA) 60 MG capsule, Take 1 capsule (60  mg total) by mouth 2 (two) times daily., Disp: 60 capsule, Rfl: 2 .  GARLIC PO, Take by mouth., Disp: , Rfl:  .  hydrochlorothiazide (HYDRODIURIL) 25 MG tablet, Take 25 mg by mouth daily., Disp: , Rfl:  .  levothyroxine (SYNTHROID) 150 MCG tablet, Take 150 mcg by mouth daily before breakfast. , Disp: , Rfl:  .  linaclotide (LINZESS) 145 MCG CAPS capsule, Take 145 mcg by mouth daily. , Disp: , Rfl:  .  lisinopril (ZESTRIL) 20 MG tablet, Take 1 tablet (20 mg total) by mouth daily., Disp: 30 tablet, Rfl: 3 .  Olopatadine HCl 0.2 % SOLN, Apply 1 drop to eye daily., Disp: , Rfl:  .  pantoprazole (PROTONIX) 40 MG tablet, Take 40 mg by mouth daily., Disp: , Rfl:  .  potassium chloride (KLOR-CON) 10 MEQ tablet, Take 10 mEq by mouth 3 (three) times daily., Disp: , Rfl:  .  pregabalin (LYRICA) 100 MG capsule, Take 100 mg by mouth 4 (four) times daily., Disp: , Rfl:  .  rOPINIRole (REQUIP) 2 MG tablet, Take 2 mg by mouth 2 (two) times daily., Disp: , Rfl:  .  vitamin C (ASCORBIC ACID) 500 MG tablet, Take 500 mg by mouth at bedtime., Disp: , Rfl:  .  warfarin (COUMADIN) 5 MG tablet, Take 1 tablet (5 mg total) by mouth daily at 6 PM. (Patient taking differently: Take 5-10 mg by mouth See admin instructions. Take 1 and 1/2 tablets on Tuesday and Thursday then take 1 tablet all the other days), Disp: 30 tablet, Rfl: 6   Patient Care Team: [name], MD as PCP - General (Family Medicine)    Objective:    Vitals: BP 114/74 (BP Location: Left  Arm, Patient Position: Sitting, Cuff Size: Large)   Pulse 90   Temp (!) 97.1 F (36.2 C) (Temporal)   Resp 16   Ht 5\' 10"  (1.778 m)   Wt 281 lb (127.5 kg)   LMP  (LMP Unknown)   BMI 40.32 kg/m    Vitals:   01/20/20 1413  BP: 114/74  Pulse: 90  Resp: 16  Temp: (!) 97.1 F (36.2 C)  TempSrc: Temporal  Weight: 281 lb (127.5 kg)  Height: 5\' 10"  (1.778 m)     Physical Exam Vitals reviewed.  Constitutional:      General: She is not in acute distress.    Appearance: Normal appearance. She is well-developed. She is not diaphoretic.  HENT:     Head: Normocephalic and atraumatic.     Right Ear: Tympanic membrane, ear canal and external ear normal.     Left Ear: Tympanic membrane, ear canal and external ear normal.     Nose: Nose normal.     Mouth/Throat:     Mouth: Mucous membranes are moist.     Comments: Thrush of tongue, oral cavity, OP Eyes:     General: No scleral icterus.    Conjunctiva/sclera: Conjunctivae normal.     Pupils: Pupils are equal, round, and reactive to light.  Neck:     Thyroid: No thyromegaly.  Cardiovascular:     Rate and Rhythm: Normal rate and regular rhythm.     Heart sounds: Normal heart sounds. No murmur.  Pulmonary:     Effort: Pulmonary effort is normal. No respiratory distress.     Breath sounds: Normal breath sounds. No wheezing or rales.  Abdominal:     General: There is no distension.     Palpations: Abdomen is soft.  Tenderness: There is no abdominal tenderness.  Musculoskeletal:     Cervical back: Neck supple.     Right lower leg: No edema.     Left lower leg: No edema.     Comments: Wearing CAM Walker boot on left foot.  Bruising around distal Achilles tendon of left foot  Lymphadenopathy:     Cervical: No cervical adenopathy.  Skin:    General: Skin is warm and dry.     Findings: No rash.  Neurological:     Mental Status: She is alert and oriented to person, place, and time. Mental status is at baseline.  Psychiatric:          Mood and Affect: Mood normal.        Behavior: Behavior normal.        Thought Content: Thought content normal.      Depression Screen PHQ 2/9 Scores 01/20/2020 10/25/2019 09/22/2014  PHQ - 2 Score 4 4 4   PHQ- 9 Score 13 19 15        Assessment & Plan:     Routine Health Maintenance and Physical Exam  Exercise Activities and Dietary recommendations Goals   None      There is no immunization history on file for this patient.  Health Maintenance  Topic Date Due  . PNEUMOCOCCAL POLYSACCHARIDE VACCINE AGE 44-64 HIGH RISK  Never done  . OPHTHALMOLOGY EXAM  Never done  . TETANUS/TDAP  Never done  . PAP SMEAR-Modifier  Never done  . INFLUENZA VACCINE  08/23/2020 (Originally 06/05/2019)  . HEMOGLOBIN [name]  04/24/2020  . FOOT EXAM  09/21/2020  . MAMMOGRAM  10/08/2020  . COLONOSCOPY  11/21/2022  . HIV Screening  Completed     Discussed health benefits of physical activity, and encouraged her to engage in regular exercise appropriate for her age and condition.    Due for Pneumovax, Shingrix, Tdap, but patient recently finished antibiotic course.  We will plan to vaccinate her at her next visit or sooner --------------------------------------------------------------------  Problem List Items Addressed This Visit      Digestive   Ritta Slot    Likely due to recent antibiotic use Start nystatin swish and swallow 4 times daily until resolution      Relevant Medications   nystatin (MYCOSTATIN) 100000 UNIT/ML suspension     Endocrine   Hypothyroidism    Previously well controlled, but patient reports she is having more hair loss which is typically symptomatic for her when her thyroid is uncontrolled Recheck TSH Continue current Synthroid dose pending results      Relevant Medications   levothyroxine (SYNTHROID) 150 MCG tablet   Other Relevant Orders   TSH   Type 2 diabetes mellitus with stage 3 chronic kidney disease, without long-term current use of insulin ([place])     ROI sent for previous eye exam Pneumovax today      Relevant Medications   atorvastatin (LIPITOR) 20 MG tablet     Other   Morbid obesity ([place])    Discussed importance of healthy weight management Discussed diet and exercise  Referral to healthy weight management clinic in [place]      Relevant Orders   Amb Ref to Medical Weight Management    Other Visit Diagnoses    Encounter for annual physical exam    -  Primary   Relevant Orders   MM 3D SCREEN BREAST BILATERAL   Breast cancer screening by mammogram       Relevant Orders   MM 3D SCREEN BREAST  BILATERAL       Return in about 3 months (around 04/21/2020) for chronic disease f/u.   The entirety of the information documented in the History of Present Illness, Review of Systems and Physical Exam were personally obtained by me. Portions of this information were initially documented by Lynford Humphrey, CMA and reviewed by me for thoroughness and accuracy.    Careena Degraffenreid, Dionne Bucy, MD MPH [facility]

## 2020-01-21 ENCOUNTER — Telehealth: Payer: Self-pay

## 2020-01-21 DIAGNOSIS — E039 Hypothyroidism, unspecified: Secondary | ICD-10-CM

## 2020-01-21 LAB — TSH: TSH: 11.1 u[IU]/mL — ABNORMAL HIGH (ref 0.450–4.500)

## 2020-01-21 MED ORDER — LEVOTHYROXINE SODIUM 175 MCG PO TABS: 175.0000 ug | ORAL_TABLET | Freq: Every day | ORAL | 3 refills | Status: DC

## 2020-01-21 MED ORDER — LEVOTHYROXINE SODIUM 175 MCG PO TABS: 175.0000 ug | ORAL_TABLET | Freq: Every day | ORAL | 1 refills | Status: DC

## 2020-01-21 NOTE — Addendum Note (Signed)
Addended by: Ashley Royalty E on: 01/21/2020 05:04 PM   Modules accepted: Orders

## 2020-01-21 NOTE — Telephone Encounter (Signed)
Patient returned call- notified of leb result and PCP recommendation. Patient agrees to try increased dosing- she is taking her medication as instructed. Please call Rx to [facility] and let her know when to recheck level.

## 2020-01-21 NOTE — Telephone Encounter (Addendum)
RX sent to [facility].  When do you want to recheck her TSH.   Thanks,   -Mickel Baas

## 2020-01-21 NOTE — Telephone Encounter (Signed)
LMTCB, [place] Triage Nurse may give patient results   Please send back to have Rx sent in if pt agrees.

## 2020-01-21 NOTE — Addendum Note (Signed)
Addended by: Althea Charon D on: 01/21/2020 09:33 AM   Modules accepted: Orders

## 2020-01-21 NOTE — Telephone Encounter (Signed)
2 months please

## 2020-01-21 NOTE — Telephone Encounter (Signed)
Labs ordered pt advised.   Thanks,   -Mickel Baas

## 2020-01-21 NOTE — Addendum Note (Signed)
Addended by: Ashley Royalty E on: 01/21/2020 03:40 PM   Modules accepted: Orders

## 2020-01-21 NOTE — Telephone Encounter (Signed)
-----   Message from [name], MD sent at 01/21/2020  8:12 AM EDT ----- TSH is high, meaning Synthroid dose is too low.  Is she taking it regularly?  If so, we will need ot increase the dose to 118mcg daily. OK to [facility] if patient agrees.

## 2020-01-22 DIAGNOSIS — E039 Hypothyroidism, unspecified: Secondary | ICD-10-CM | POA: Diagnosis not present

## 2020-01-23 DIAGNOSIS — Z86718 Personal history of other venous thrombosis and embolism: Secondary | ICD-10-CM | POA: Diagnosis not present

## 2020-01-23 DIAGNOSIS — Z79899 Other long term (current) drug therapy: Secondary | ICD-10-CM | POA: Diagnosis not present

## 2020-01-23 DIAGNOSIS — G629 Polyneuropathy, unspecified: Secondary | ICD-10-CM | POA: Diagnosis not present

## 2020-01-23 DIAGNOSIS — I10 Essential (primary) hypertension: Secondary | ICD-10-CM | POA: Diagnosis not present

## 2020-01-23 DIAGNOSIS — Z9049 Acquired absence of other specified parts of digestive tract: Secondary | ICD-10-CM | POA: Diagnosis not present

## 2020-01-23 DIAGNOSIS — F319 Bipolar disorder, unspecified: Secondary | ICD-10-CM | POA: Diagnosis not present

## 2020-01-23 DIAGNOSIS — N390 Urinary tract infection, site not specified: Secondary | ICD-10-CM | POA: Diagnosis not present

## 2020-01-23 DIAGNOSIS — Z853 Personal history of malignant neoplasm of breast: Secondary | ICD-10-CM | POA: Diagnosis not present

## 2020-01-23 DIAGNOSIS — E039 Hypothyroidism, unspecified: Secondary | ICD-10-CM | POA: Diagnosis not present

## 2020-01-23 DIAGNOSIS — E7800 Pure hypercholesterolemia, unspecified: Secondary | ICD-10-CM | POA: Diagnosis not present

## 2020-01-23 DIAGNOSIS — Z888 Allergy status to other drugs, medicaments and biological substances status: Secondary | ICD-10-CM | POA: Diagnosis not present

## 2020-01-23 DIAGNOSIS — N3001 Acute cystitis with hematuria: Secondary | ICD-10-CM | POA: Diagnosis not present

## 2020-01-24 DIAGNOSIS — E039 Hypothyroidism, unspecified: Secondary | ICD-10-CM | POA: Diagnosis not present

## 2020-01-24 DIAGNOSIS — S86092D Other specified injury of left Achilles tendon, subsequent encounter: Secondary | ICD-10-CM | POA: Diagnosis not present

## 2020-01-25 DIAGNOSIS — E039 Hypothyroidism, unspecified: Secondary | ICD-10-CM | POA: Diagnosis not present

## 2020-01-26 ENCOUNTER — Encounter: Payer: Self-pay | Admitting: Family Medicine

## 2020-01-26 DIAGNOSIS — E039 Hypothyroidism, unspecified: Secondary | ICD-10-CM | POA: Diagnosis not present

## 2020-01-26 NOTE — Telephone Encounter (Signed)
Reschedule for 1 month after completion of antibiotics, please

## 2020-01-27 DIAGNOSIS — E039 Hypothyroidism, unspecified: Secondary | ICD-10-CM | POA: Diagnosis not present

## 2020-01-28 DIAGNOSIS — E039 Hypothyroidism, unspecified: Secondary | ICD-10-CM | POA: Diagnosis not present

## 2020-01-29 DIAGNOSIS — E039 Hypothyroidism, unspecified: Secondary | ICD-10-CM | POA: Diagnosis not present

## 2020-01-30 DIAGNOSIS — E039 Hypothyroidism, unspecified: Secondary | ICD-10-CM | POA: Diagnosis not present

## 2020-01-31 DIAGNOSIS — E039 Hypothyroidism, unspecified: Secondary | ICD-10-CM | POA: Diagnosis not present

## 2020-02-01 DIAGNOSIS — E039 Hypothyroidism, unspecified: Secondary | ICD-10-CM | POA: Diagnosis not present

## 2020-02-02 DIAGNOSIS — E039 Hypothyroidism, unspecified: Secondary | ICD-10-CM | POA: Diagnosis not present

## 2020-02-03 ENCOUNTER — Ambulatory Visit: Payer: Self-pay | Admitting: Family Medicine

## 2020-02-03 DIAGNOSIS — E039 Hypothyroidism, unspecified: Secondary | ICD-10-CM | POA: Diagnosis not present

## 2020-02-03 DIAGNOSIS — G4733 Obstructive sleep apnea (adult) (pediatric): Secondary | ICD-10-CM | POA: Diagnosis not present

## 2020-02-05 DIAGNOSIS — E039 Hypothyroidism, unspecified: Secondary | ICD-10-CM | POA: Diagnosis not present

## 2020-02-06 DIAGNOSIS — E039 Hypothyroidism, unspecified: Secondary | ICD-10-CM | POA: Diagnosis not present

## 2020-02-07 DIAGNOSIS — E039 Hypothyroidism, unspecified: Secondary | ICD-10-CM | POA: Diagnosis not present

## 2020-02-08 DIAGNOSIS — E039 Hypothyroidism, unspecified: Secondary | ICD-10-CM | POA: Diagnosis not present

## 2020-02-08 DIAGNOSIS — M7752 Other enthesopathy of left foot: Secondary | ICD-10-CM | POA: Diagnosis not present

## 2020-02-08 DIAGNOSIS — S86092D Other specified injury of left Achilles tendon, subsequent encounter: Secondary | ICD-10-CM | POA: Diagnosis not present

## 2020-02-09 DIAGNOSIS — E039 Hypothyroidism, unspecified: Secondary | ICD-10-CM | POA: Diagnosis not present

## 2020-02-10 DIAGNOSIS — E039 Hypothyroidism, unspecified: Secondary | ICD-10-CM | POA: Diagnosis not present

## 2020-02-11 ENCOUNTER — Other Ambulatory Visit (HOSPITAL_COMMUNITY): Payer: Self-pay | Admitting: Psychiatry

## 2020-02-11 DIAGNOSIS — E039 Hypothyroidism, unspecified: Secondary | ICD-10-CM | POA: Diagnosis not present

## 2020-02-12 DIAGNOSIS — E039 Hypothyroidism, unspecified: Secondary | ICD-10-CM | POA: Diagnosis not present

## 2020-02-13 DIAGNOSIS — E039 Hypothyroidism, unspecified: Secondary | ICD-10-CM | POA: Diagnosis not present

## 2020-02-14 ENCOUNTER — Ambulatory Visit (INDEPENDENT_AMBULATORY_CARE_PROVIDER_SITE_OTHER): Payer: Medicaid Other | Admitting: Psychiatry

## 2020-02-14 ENCOUNTER — Encounter (HOSPITAL_COMMUNITY): Payer: Self-pay | Admitting: Psychiatry

## 2020-02-14 ENCOUNTER — Other Ambulatory Visit: Payer: Self-pay

## 2020-02-14 DIAGNOSIS — F3130 Bipolar disorder, current episode depressed, mild or moderate severity, unspecified: Secondary | ICD-10-CM

## 2020-02-14 DIAGNOSIS — E039 Hypothyroidism, unspecified: Secondary | ICD-10-CM | POA: Diagnosis not present

## 2020-02-14 MED ORDER — FLUOXETINE HCL 40 MG PO CAPS: 40.0000 mg | ORAL_CAPSULE | Freq: Every day | ORAL | 2 refills | Status: DC

## 2020-02-14 MED ORDER — ALPRAZOLAM 1 MG PO TABS: 1.0000 mg | ORAL_TABLET | Freq: Two times a day (BID) | ORAL | 2 refills | Status: DC | PRN

## 2020-02-14 MED ORDER — AMPHETAMINE-DEXTROAMPHETAMINE 30 MG PO TABS: 30.0000 mg | ORAL_TABLET | Freq: Two times a day (BID) | ORAL | 0 refills | Status: DC

## 2020-02-14 NOTE — Progress Notes (Signed)
Virtual Visit via Video Note  I connected with Jane [name] on 02/14/20 at  9:40 AM EDT by a video enabled telemedicine application and verified that I am speaking with the correct person using two identifiers.   I discussed the limitations of evaluation and management by telemedicine and the availability of in person appointments. The patient expressed understanding and agreed to proceed    I discussed the assessment and treatment plan with the patient. The patient was provided an opportunity to ask questions and all were answered. The patient agreed with the plan and demonstrated an understanding of the instructions.   The patient was advised to call back or seek an in-person evaluation if the symptoms worsen or if the condition fails to improve as anticipated.  I provided 15 minutes of non-face-to-face time during this encounter.   [name], MD  [facility] MD/PA/NP OP Progress Note  02/14/2020 10:18 AM Jane [name]  MRN:  [id number]  Chief Complaint:  Chief Complaint    Depression; Anxiety; Follow-up     [id number] patient is a 54year-old divorced white female who lives alone in [place]. She is on disability due to bipolar disorder.  The patient states that she became very depressed when her mother died in 02/19/00 of a brain aneurysm. She started getting. Sad but eventually became manic and develop mood swings and agitated behavior. She's been on numerous medications over the years. In 1988-02-19 she also had postpartum depression. For many years she was on lithium but it caused a bad taste in her mouth and her doctor was worried it might be starting to affect her renal function so was discontinued. She also thinks that it stopped working after a number of years  The patient returns for follow-up after 3 months.  She states that she feels depressed for the last 10 days or so.  She has no energy to do anything and has no motivation.  She usually likes to visit with her grandchildren but does not  feel like doing this at all.  She cannot give any reasons and terms of family dynamics.  However her TSH was quite elevated recently and she is now on a higher dose of Synthroid for the last 3 weeks.  I explained that this may have something to do with this.  She also suffered a torn Achilles tendon.  She is not sleeping all that well which may contribute but she states she is not just tired but also sad.  We did review some of the medicines she is taken in the past.  I suggested we switch from Cymbalta to Prozac as it may give her more energy and she is willing to give this a try.  The Xanax continues to help with anxiety and Adderall with focus Visit Diagnosis:    ICD-10-CM   1. Bipolar I disorder, most recent episode depressed ([place])  F31.30     Past Psychiatric History: Long-term outpatient treatment  Past Medical History:  Past Medical History:  Diagnosis Date  . Anxiety   . Bipolar 1 disorder ([place])   . Bipolar disorder ([place])   . Chronic kidney disease    Stage 3 kidney disease;dx by Dr. Sinda Du.   . Constipation   . Depression   . Diabetes mellitus without complication ([place])   . Dyspnea    with exertion  . GERD (gastroesophageal reflux disease)   . Headache    migraines  . High cholesterol   . History of blood clots   .  History of DVT (deep vein thrombosis)    left leg  . Hypertension    states under control with meds., has been on med. x 2 years  . Hypothyroidism   . Neuropathy   . Obsessive-compulsive disorder   . Peripheral vascular disease ([place])   . Respiratory failure requiring intubation ([place])   . Restless leg syndrome   . Thrombocytopenia ([place]) 09/15/2019  . Trigger thumb of left hand 01/2018  . Trigger thumb of right hand     Past Surgical History:  Procedure Laterality Date  . ABDOMINAL HYSTERECTOMY  06/2016   complete  . APPLICATION OF WOUND VAC Left 04/27/2019   Procedure: APPLICATION OF WOUND VAC LEFT GROIN;  Surgeon: Waynetta Sandy, MD;   Location: [place];  Service: Vascular;  Laterality: Left;  . AV FISTULA PLACEMENT Left 11/24/2018   Procedure: ARTERIOVENOUS (AV) FISTULA CREATION LEFT SFA TO LEFT FEMORAL VEIN;  Surgeon: Waynetta Sandy, MD;  Location: [place];  Service: Vascular;  Laterality: Left;  . BACK SURGERY    . CHOLECYSTECTOMY    . COLONOSCOPY WITH PROPOFOL N/A 11/22/2019   Procedure: COLONOSCOPY WITH PROPOFOL;  Surgeon: [name], MD;  Location[facility]COPY;  Service: Gastroenterology;  Laterality: N/A;  . FEMORAL ARTERY EXPLORATION  03/30/2019   Procedure: Left Common Femoral Artery and Vein Exploration;  Surgeon: Waynetta Sandy, MD;  Location: [place];  Service: Vascular;;  . FEMORAL-FEMORAL BYPASS GRAFT Left 11/24/2018   Procedure: BYPASS GRAFT FEMORAL-FEMORAL VENOUS LEFT TO RIGHT PALMA PROCEDURE USING CRYOVEIN;  Surgeon: Waynetta Sandy, MD;  Location: [place];  Service: Vascular;  Laterality: Left;  . GROIN DEBRIDEMENT Left 04/27/2019   Procedure: GROIN DEBRIDEMENT;  Surgeon: Waynetta Sandy, MD;  Location: [place];  Service: Vascular;  Laterality: Left;  . INSERTION OF ILIAC STENT  03/30/2019   Procedure: Stent of left common, external iliac veins and left common femoral vein;  Surgeon: Waynetta Sandy, MD;  Location: [place];  Service: Vascular;;  . LOWER EXTREMITY VENOGRAPHY N/A 08/17/2018   Procedure: LOWER EXTREMITY VENOGRAPHY - Central Venogram;  Surgeon: Waynetta Sandy, MD;  Location: [place] CV LAB;  Service: Cardiovascular;  Laterality: N/A;  . LOWER EXTREMITY VENOGRAPHY Bilateral 03/09/2019   Procedure: LOWER EXTREMITY VENOGRAPHY;  Surgeon: Waynetta Sandy, MD;  Location: [place] CV LAB;  Service: Cardiovascular;  Laterality: Bilateral;  . LOWER EXTREMITY VENOGRAPHY Left 08/16/2019   Procedure: LOWER EXTREMITY VENOGRAPHY;  Surgeon: Waynetta Sandy, MD;  Location: [place] CV LAB;  Service: Cardiovascular;  Laterality: Left;  .  LUMBAR FUSION  11/21/2000   L5-S1  . LUMBAR SPINE SURGERY     x 2 others  . PATCH ANGIOPLASTY Left 03/30/2019   Procedure: Patch Angioplasty of the Left Common Femoral Vein using Venosure Biologic patch;  Surgeon: Waynetta Sandy, MD;  Location: [place];  Service: Vascular;  Laterality: Left;  . PERIPHERAL VASCULAR INTERVENTION Left 08/16/2019   Procedure: PERIPHERAL VASCULAR INTERVENTION;  Surgeon: Waynetta Sandy, MD;  Location: [place] CV LAB;  Service: Cardiovascular;  Laterality: Left;  common femoral/femoral vein stent  . TRIGGER FINGER RELEASE Right 12/01/2017   Procedure: RELEASE TRIGGER FINGER/A-1 PULLEY RIGHT THUMB;  Surgeon: Leanora Cover, MD;  Location: [place];  Service: Orthopedics;  Laterality: Right;  . TRIGGER FINGER RELEASE Left 01/26/2018   Procedure: LEFT TRIGGER THUMB RELEASE;  Surgeon: Leanora Cover, MD;  Location: [place];  Service: Orthopedics;  Laterality: Left;  . ULTRASOUND  GUIDANCE FOR VASCULAR ACCESS Right 03/30/2019   Procedure: Ultrasound-guided cannulation right internal jugular vein;  Surgeon: Waynetta Sandy, MD;  Location[facility] OR;  Service: Vascular;  Laterality: Right;    Family Psychiatric History: see below  Family History:  Family History  Problem Relation Age of Onset  . Heart disease Mother   . Hyperlipidemia Mother   . Hypertension Mother   . Bipolar disorder Mother   . Diabetes Father   . Heart disease Father   . Hyperlipidemia Father   . Hypertension Father   . Drug abuse Daughter   . ADD / ADHD Daughter   . Drug abuse Daughter   . Anxiety disorder Daughter   . Bipolar disorder Daughter   . Hypertension Sister   . Hypertension Brother   . Hyperlipidemia Brother   . Heart disease Brother   . Bipolar disorder Maternal Aunt   . Suicidality Maternal Aunt     Social History:  Social History   Socioeconomic History  . Marital status: Divorced    Spouse name: Not on file   . Number of children: 3  . Years of education: Not on file  . Highest education level: Not on file  Occupational History  . Not on file  Tobacco Use  . Smoking status: Never Smoker  . Smokeless tobacco: Never Used  Substance and Sexual Activity  . Alcohol use: No  . Drug use: No  . Sexual activity: Not Currently    Partners: Male    Birth control/protection: Surgical  Other Topics Concern  . Not on file  Social History Narrative  . Not on file   Social Determinants of Health   Financial Resource Strain:   . Difficulty of Paying Living Expenses:   Food Insecurity:   . Worried About Charity fundraiser in the Last Year:   . Arboriculturist in the Last Year:   Transportation Needs:   . Film/video editor (Medical):   Marland Kitchen Lack of Transportation (Non-Medical):   Physical Activity:   . Days of Exercise per Week:   . Minutes of Exercise per Session:   Stress:   . Feeling of Stress :   Social Connections:   . Frequency of Communication with Friends and Family:   . Frequency of Social Gatherings with Friends and Family:   . Attends Religious Services:   . Active Member of Clubs or Organizations:   . Attends Archivist Meetings:   Marland Kitchen Marital Status:     Allergies:  Allergies  Allergen Reactions  . Aripiprazole Other (See Comments)    BECOMES  VIOLENT   . Seroquel [Quetiapine Fumarate] Other (See Comments)    BECOMES VIOLENT  . Chlorpromazine Other (See Comments)    SEVERE ANXIETY   . Gabapentin Other (See Comments)    NIGHTMARES     Metabolic Disorder Labs: Lab Results  Component Value Date   HGBA1C 6.3 (H) 10/25/2019   MPG 134.11 03/26/2019   No results found for: PROLACTIN Lab Results  Component Value Date   CHOL 210 (H) 10/25/2019   TRIG 234 (H) 10/25/2019   HDL 42 10/25/2019   CHOLHDL 5.0 (H) 10/25/2019   LDLCALC 127 (H) 10/25/2019   LDLCALC 108 09/03/2017   Lab Results  Component Value Date   TSH 11.100 (H) 01/20/2020   TSH 1.260  10/25/2019    Therapeutic Level Labs: No results found for: LITHIUM Lab Results  Component Value Date   VALPROATE 61.2 03/23/2014  No components found for:  CBMZ  Current Medications: Current Outpatient Medications  Medication Sig Dispense Refill  . ALPRAZolam (XANAX) 1 MG tablet Take 1 tablet (1 mg total) by mouth 2 (two) times daily as needed for anxiety. 60 tablet 2  . amphetamine-dextroamphetamine (ADDERALL) 30 MG tablet Take 1 tablet by mouth 2 (two) times daily. 60 tablet 0  . aspirin EC 81 MG tablet Take 81 mg by mouth daily.    Marland Kitchen atorvastatin (LIPITOR) 20 MG tablet Take 1 tablet (20 mg total) by mouth daily. 90 tablet 3  . cetirizine (ZYRTEC) 10 MG tablet Take 1 tablet (10 mg total) by mouth daily. 90 tablet 1  . cyclobenzaprine (FLEXERIL) 5 MG tablet Take 10 mg by mouth 3 (three) times daily.     . cycloSPORINE (RESTASIS) 0.05 % ophthalmic emulsion Place 1 drop into both eyes 2 (two) times daily.    Marland Kitchen FLUoxetine (PROZAC) 40 MG capsule Take 1 capsule (40 mg total) by mouth daily. 30 capsule 2  . GARLIC PO Take by mouth.    . hydrochlorothiazide (HYDRODIURIL) 25 MG tablet Take 1 tablet (25 mg total) by mouth daily. 90 tablet 3  . levothyroxine (SYNTHROID) 175 MCG tablet Take 1 tablet (175 mcg total) by mouth daily before breakfast. 90 tablet 1  . linaclotide (LINZESS) 145 MCG CAPS capsule Take 1 capsule (145 mcg total) by mouth daily. 90 capsule 3  . lisinopril (ZESTRIL) 20 MG tablet Take 1 tablet (20 mg total) by mouth daily. 30 tablet 3  . nystatin (MYCOSTATIN) 100000 UNIT/ML suspension Take 5 mLs (500,000 Units total) by mouth 4 (four) times daily. 60 mL 0  . Olopatadine HCl 0.2 % SOLN Apply 1 drop to eye daily.    . pantoprazole (PROTONIX) 40 MG tablet Take 1 tablet (40 mg total) by mouth daily. 90 tablet 3  . potassium chloride (KLOR-CON) 10 MEQ tablet Take 1 tablet (10 mEq total) by mouth 3 (three) times daily. 270 tablet 3  . pregabalin (LYRICA) 100 MG capsule Take 100  mg by mouth 4 (four) times daily.    Marland Kitchen rOPINIRole (REQUIP) 2 MG tablet Take 1 tablet (2 mg total) by mouth 2 (two) times daily. 180 tablet 3  . vitamin C (ASCORBIC ACID) 500 MG tablet Take 500 mg by mouth at bedtime.    Marland Kitchen warfarin (COUMADIN) 5 MG tablet Take 1 tablet (5 mg total) by mouth daily at 6 PM. (Patient taking differently: Take 5-10 mg by mouth See admin instructions. Take 1 and 1/2 tablets on Tuesday and Thursday then take 1 tablet all the other days) 30 tablet 6   No current facility-administered medications for this visit.     Musculoskeletal: Strength & Muscle Tone: within normal limits Gait & Station: normal Patient leans: N/A  Psychiatric Specialty Exam: Review of Systems  Constitutional: Positive for fatigue.  Musculoskeletal: Positive for joint swelling.  Psychiatric/Behavioral: Positive for dysphoric mood and sleep disturbance.  All other systems reviewed and are negative.   There were no vitals taken for this visit.There is no height or weight on file to calculate BMI.  General Appearance: Casual and Fairly Groomed  Eye Contact:  Good  Speech:  Clear and Coherent  Volume:  Normal  Mood:  Depressed  Affect:  Constricted  Thought Process:  Goal Directed  Orientation:  Full (Time, Place, and Person)  Thought Content: Rumination   Suicidal Thoughts:  No  Homicidal Thoughts:  No  Memory:  Immediate;   Good Recent;  Good Remote;   Good  Judgement:  Good  Insight:  Good  Psychomotor Activity:  Decreased  Concentration:  Concentration: Good and Attention Span: Good  Recall:  Good  Fund of Knowledge: Good  Language: Good  Akathisia:  No  Handed:  Right  AIMS (if indicated): not done  Assets:  Communication Skills Desire for Improvement Resilience Social Support Talents/Skills  ADL's:  Intact  Cognition: WNL  Sleep:  Poor   Screenings: PHQ2-9     Office Visit from 01/20/2020 in [facility] Visit from 10/25/2019 in [facility]  PHQ-2 Total Score  4  4  4   PHQ-9 Total Score  13  19  15        Assessment and Plan: This patient is a 54 year old female with a history of depression anxiety and ADD.  She states that she has become more depressed recently so we will taper off Cymbalta 60 mg twice daily and start Prozac 40 mg daily.  She will continue Xanax 1 mg twice daily as needed for anxiety and Adderall 30 mg twice daily for ADD.  She will return to see me in 4 weeks   [name], MD 02/14/2020, 10:18 AM

## 2020-02-15 ENCOUNTER — Ambulatory Visit (INDEPENDENT_AMBULATORY_CARE_PROVIDER_SITE_OTHER): Payer: Medicaid Other

## 2020-02-15 ENCOUNTER — Other Ambulatory Visit: Payer: Self-pay

## 2020-02-15 DIAGNOSIS — E039 Hypothyroidism, unspecified: Secondary | ICD-10-CM | POA: Diagnosis not present

## 2020-02-15 DIAGNOSIS — Z86718 Personal history of other venous thrombosis and embolism: Secondary | ICD-10-CM | POA: Diagnosis not present

## 2020-02-15 LAB — POCT INR
INR: 4.6 — AB (ref 2.0–3.0)
PT: 54.9

## 2020-02-15 NOTE — Patient Instructions (Signed)
Description   Current dose: 5 mg daily, except 7.5 mg on Tuesday's and Thursday's. Today changes: Hold today,  Then take 5mg  daily, except 7.5mg  on Tuesdays. Return in 4 weeks.

## 2020-02-16 ENCOUNTER — Encounter: Payer: Self-pay | Admitting: Family Medicine

## 2020-02-16 ENCOUNTER — Other Ambulatory Visit: Payer: Self-pay

## 2020-02-16 ENCOUNTER — Ambulatory Visit (INDEPENDENT_AMBULATORY_CARE_PROVIDER_SITE_OTHER): Payer: Medicaid Other | Admitting: Family Medicine

## 2020-02-16 VITALS — BP 118/75 | HR 69 | Temp 97.1°F | Resp 16 | Ht 70.0 in | Wt 285.0 lb

## 2020-02-16 DIAGNOSIS — E039 Hypothyroidism, unspecified: Secondary | ICD-10-CM

## 2020-02-16 DIAGNOSIS — M7989 Other specified soft tissue disorders: Secondary | ICD-10-CM | POA: Diagnosis not present

## 2020-02-16 NOTE — Progress Notes (Signed)
Established patient visit     Patient: Jane [name]   DOB: [birth year]   54 y.o. Female  MRN: [id number] Visit Date: 02/16/2020  I,Sulibeya S Dimas,acting as a scribe for Lavon Paganini, MD.,have documented all relevant documentation on the behalf of Lavon Paganini, MD,as directed by  Lavon Paganini, MD while in the presence of Lavon Paganini, MD.  Today's healthcare provider: Lavon Paganini, MD  Subjective:    Chief Complaint  Patient presents with  . Hand Pain   HPI  Patient here today C/O pain and swelling x's several weeks. Patient denies any injuries. Patient reports pain and swelling are present day and night. Patient has not taken any medication for pain.Patient reports numbness and tingling intermittently.   Social History   Tobacco Use  . Smoking status: Never Smoker  . Smokeless tobacco: Never Used  Substance Use Topics  . Alcohol use: No  . Drug use: No       Medications: Outpatient Medications Prior to Visit  Medication Sig  . ALPRAZolam (XANAX) 1 MG tablet Take 1 tablet (1 mg total) by mouth 2 (two) times daily as needed for anxiety.  Marland Kitchen amphetamine-dextroamphetamine (ADDERALL) 30 MG tablet Take 1 tablet by mouth 2 (two) times daily.  Marland Kitchen aspirin EC 81 MG tablet Take 81 mg by mouth daily.  Marland Kitchen atorvastatin (LIPITOR) 20 MG tablet Take 1 tablet (20 mg total) by mouth daily.  . cetirizine (ZYRTEC) 10 MG tablet Take 1 tablet (10 mg total) by mouth daily.  . cyclobenzaprine (FLEXERIL) 5 MG tablet Take 10 mg by mouth 3 (three) times daily.   . cycloSPORINE (RESTASIS) 0.05 % ophthalmic emulsion Place 1 drop into both eyes 2 (two) times daily.  Marland Kitchen FLUoxetine (PROZAC) 40 MG capsule Take 1 capsule (40 mg total) by mouth daily.  Marland Kitchen GARLIC PO Take by mouth.  . hydrochlorothiazide (HYDRODIURIL) 25 MG tablet Take 1 tablet (25 mg total) by mouth daily.  Marland Kitchen levothyroxine (SYNTHROID) 175 MCG tablet Take 1 tablet (175 mcg total) by mouth daily before breakfast.  .  linaclotide (LINZESS) 145 MCG CAPS capsule Take 1 capsule (145 mcg total) by mouth daily.  Marland Kitchen lisinopril (ZESTRIL) 20 MG tablet Take 1 tablet (20 mg total) by mouth daily.  Marland Kitchen nystatin (MYCOSTATIN) 100000 UNIT/ML suspension Take 5 mLs (500,000 Units total) by mouth 4 (four) times daily.  . Olopatadine HCl 0.2 % SOLN Apply 1 drop to eye daily.  . pantoprazole (PROTONIX) 40 MG tablet Take 1 tablet (40 mg total) by mouth daily.  . potassium chloride (KLOR-CON) 10 MEQ tablet Take 1 tablet (10 mEq total) by mouth 3 (three) times daily.  . pregabalin (LYRICA) 100 MG capsule Take 100 mg by mouth 4 (four) times daily.  Marland Kitchen rOPINIRole (REQUIP) 2 MG tablet Take 1 tablet (2 mg total) by mouth 2 (two) times daily.  . vitamin C (ASCORBIC ACID) 500 MG tablet Take 500 mg by mouth at bedtime.  Marland Kitchen warfarin (COUMADIN) 5 MG tablet Take 1 tablet (5 mg total) by mouth daily at 6 PM. (Patient taking differently: Take 5-10 mg by mouth See admin instructions. Take 1 and 1/2 tablets on Tuesday and Thursday then take 1 tablet all the other days)   No facility-administered medications prior to visit.    Review of Systems  Constitutional: Negative.   Respiratory: Negative.   Cardiovascular: Positive for leg swelling.    Last thyroid functions Lab Results  Component Value Date   TSH 11.100 (H) 01/20/2020  Objective:    BP 118/75 (BP Location: Left Arm, Patient Position: Sitting, Cuff Size: Large)   Pulse 69   Temp (!) 97.1 F (36.2 C) (Temporal)   Resp 16   Ht 5\' 10"  (1.778 m)   Wt 285 lb (129.3 kg)   LMP  (LMP Unknown)   BMI 40.89 kg/m    Physical Exam Vitals reviewed.  Constitutional:      General: She is not in acute distress.    Appearance: Normal appearance. She is well-developed. She is not diaphoretic.  HENT:     Head: Normocephalic and atraumatic.  Eyes:     General: No scleral icterus.    Conjunctiva/sclera: Conjunctivae normal.  Cardiovascular:     Rate and Rhythm: Normal rate and  regular rhythm.     Pulses: Normal pulses.  Pulmonary:     Effort: Pulmonary effort is normal. No respiratory distress.  Musculoskeletal:        General: Swelling (notable swelling of soft tissues in hands b/l with "sausage fingers") present. Normal range of motion.     Right lower leg: No edema.     Left lower leg: No edema.     Comments: Negative tinels Negative phalens   Skin:    General: Skin is warm and dry.     Findings: No rash.  Neurological:     Mental Status: She is alert and oriented to person, place, and time.     Sensory: No sensory deficit.     Motor: No weakness.  Psychiatric:        Behavior: Behavior normal.      No results found for any visits on 02/16/20.    Assessment & Plan:    Problem List Items Addressed This Visit      Endocrine   Hypothyroidism - Primary    Uncontrolled - Synthroid dose recently increased Likely contributes to swelling Will recheck TSH at next office visit Continue current Synthroid dose      Relevant Orders   Comprehensive metabolic panel     Other   Bilateral hand swelling    New problem No change in range of motion No particular joint swelling Swelling seems to be limited to soft tissue Given that is limited to bilateral hands, with no evidence of carpal tunnel on exam, need to consider possible rheumatologic conditions Check labs as below Referral to rheumatology for further evaluation      Relevant Orders   Comprehensive metabolic panel   ANA   Sedimentation rate   Rheumatoid factor   C-reactive protein   Ambulatory referral to Rheumatology     Return as planned.     I, Lavon Paganini, MD, have reviewed all documentation for this visit. The documentation on 02/16/20 for the exam, diagnosis, procedures, and orders are all accurate and complete.   Jenson Beedle, Dionne Bucy, MD, MPH [facility]

## 2020-02-16 NOTE — Assessment & Plan Note (Addendum)
Uncontrolled - Synthroid dose recently increased Likely contributes to swelling Will recheck TSH at next office visit Continue current Synthroid dose

## 2020-02-16 NOTE — Assessment & Plan Note (Signed)
New problem No change in range of motion No particular joint swelling Swelling seems to be limited to soft tissue Given that is limited to bilateral hands, with no evidence of carpal tunnel on exam, need to consider possible rheumatologic conditions Check labs as below Referral to rheumatology for further evaluation

## 2020-02-17 ENCOUNTER — Telehealth: Payer: Self-pay

## 2020-02-17 DIAGNOSIS — E039 Hypothyroidism, unspecified: Secondary | ICD-10-CM | POA: Diagnosis not present

## 2020-02-17 LAB — C-REACTIVE PROTEIN: CRP: 11 mg/L — ABNORMAL HIGH (ref 0–10)

## 2020-02-17 LAB — COMPREHENSIVE METABOLIC PANEL
ALT: 23 IU/L (ref 0–32)
AST: 20 IU/L (ref 0–40)
Albumin/Globulin Ratio: 1.4 (ref 1.2–2.2)
Albumin: 4.2 g/dL (ref 3.8–4.9)
Alkaline Phosphatase: 122 IU/L — ABNORMAL HIGH (ref 39–117)
BUN/Creatinine Ratio: 27 — ABNORMAL HIGH (ref 9–23)
BUN: 31 mg/dL — ABNORMAL HIGH (ref 6–24)
Bilirubin Total: 0.4 mg/dL (ref 0.0–1.2)
CO2: 25 mmol/L (ref 20–29)
Calcium: 9.5 mg/dL (ref 8.7–10.2)
Chloride: 103 mmol/L (ref 96–106)
Creatinine, Ser: 1.16 mg/dL — ABNORMAL HIGH (ref 0.57–1.00)
GFR calc Af Amer: 62 mL/min/{1.73_m2} (ref >59)
GFR calc non Af Amer: 54 mL/min/{1.73_m2} — ABNORMAL LOW (ref >59)
Globulin, Total: 2.9 g/dL (ref 1.5–4.5)
Glucose: 130 mg/dL — ABNORMAL HIGH (ref 65–99)
Potassium: 4.3 mmol/L (ref 3.5–5.2)
Sodium: 142 mmol/L (ref 134–144)
Total Protein: 7.1 g/dL (ref 6.0–8.5)

## 2020-02-17 LAB — RHEUMATOID FACTOR: Rheumatoid fact SerPl-aCnc: <10 IU/mL (ref 0.0–13.9)

## 2020-02-17 LAB — ANA: Anti Nuclear Antibody (ANA): NEGATIVE

## 2020-02-17 LAB — SEDIMENTATION RATE: Sed Rate: 33 mm/hr (ref 0–40)

## 2020-02-17 NOTE — Telephone Encounter (Signed)
Patient advised as below.  

## 2020-02-17 NOTE — Telephone Encounter (Signed)
-----   Message from [name], MD sent at 02/17/2020  9:00 AM EDT ----- Normal/stable kidney and liver function.  Other labs pending

## 2020-02-18 DIAGNOSIS — E039 Hypothyroidism, unspecified: Secondary | ICD-10-CM | POA: Diagnosis not present

## 2020-02-19 DIAGNOSIS — E039 Hypothyroidism, unspecified: Secondary | ICD-10-CM | POA: Diagnosis not present

## 2020-02-20 DIAGNOSIS — E039 Hypothyroidism, unspecified: Secondary | ICD-10-CM | POA: Diagnosis not present

## 2020-02-21 DIAGNOSIS — E039 Hypothyroidism, unspecified: Secondary | ICD-10-CM | POA: Diagnosis not present

## 2020-02-22 DIAGNOSIS — E039 Hypothyroidism, unspecified: Secondary | ICD-10-CM | POA: Diagnosis not present

## 2020-02-22 NOTE — Progress Notes (Deleted)
Established patient visit    Patient: Jane [name]   DOB: December 07, 1965   54 y.o. Female  MRN: [id number] Visit Date: 02/22/2020  Today's healthcare provider: Lavon Paganini, MD   No chief complaint on file.  Subjective    HPI Hypothyroid, follow-up  Lab Results  Component Value Date   TSH 11.100 (H) 01/20/2020   TSH 1.260 10/25/2019   TSH 7.92 (A) 06/30/2019   Wt Readings from Last 3 Encounters:  02/16/20 285 lb (129.3 kg)  01/20/20 281 lb (127.5 kg)  01/12/20 275 lb (124.7 kg)    She was last seen for hypothyroid {NUMBERS 1-12:18279} {days/wks/mos/yrs:310907} ago.  Management since that visit includes ***. She reports {excellent/good/fair/poor:19665} compliance with treatment. She {is/is not:21021397} having side effects. {document side effects if present:1}  Symptoms: {Yes/No:20286} change in energy level {Yes/No:20286} constipation {Yes/No:20286} diarrhea {Yes/No:20286} heat / cold intolerance {Yes/No:20286} nervousness {Yes/No:20286} palpitations {Yes/No:20286} weight changes  -----------------------------------------------------------------------------------------   {Show patient history (optional):23778::" "}   Medications: Outpatient Medications Prior to Visit  Medication Sig  . ALPRAZolam (XANAX) 1 MG tablet Take 1 tablet (1 mg total) by mouth 2 (two) times daily as needed for anxiety.  Marland Kitchen amphetamine-dextroamphetamine (ADDERALL) 30 MG tablet Take 1 tablet by mouth 2 (two) times daily.  Marland Kitchen aspirin EC 81 MG tablet Take 81 mg by mouth daily.  Marland Kitchen atorvastatin (LIPITOR) 20 MG tablet Take 1 tablet (20 mg total) by mouth daily.  . cetirizine (ZYRTEC) 10 MG tablet Take 1 tablet (10 mg total) by mouth daily.  . cyclobenzaprine (FLEXERIL) 5 MG tablet Take 10 mg by mouth 3 (three) times daily.   . cycloSPORINE (RESTASIS) 0.05 % ophthalmic emulsion Place 1 drop into both eyes 2 (two) times daily.  Marland Kitchen FLUoxetine (PROZAC) 40 MG capsule Take 1 capsule (40 mg  total) by mouth daily.  Marland Kitchen GARLIC PO Take by mouth.  . hydrochlorothiazide (HYDRODIURIL) 25 MG tablet Take 1 tablet (25 mg total) by mouth daily.  Marland Kitchen levothyroxine (SYNTHROID) 175 MCG tablet Take 1 tablet (175 mcg total) by mouth daily before breakfast.  . linaclotide (LINZESS) 145 MCG CAPS capsule Take 1 capsule (145 mcg total) by mouth daily.  Marland Kitchen lisinopril (ZESTRIL) 20 MG tablet Take 1 tablet (20 mg total) by mouth daily.  Marland Kitchen nystatin (MYCOSTATIN) 100000 UNIT/ML suspension Take 5 mLs (500,000 Units total) by mouth 4 (four) times daily.  . Olopatadine HCl 0.2 % SOLN Apply 1 drop to eye daily.  . pantoprazole (PROTONIX) 40 MG tablet Take 1 tablet (40 mg total) by mouth daily.  . potassium chloride (KLOR-CON) 10 MEQ tablet Take 1 tablet (10 mEq total) by mouth 3 (three) times daily.  . pregabalin (LYRICA) 100 MG capsule Take 100 mg by mouth 4 (four) times daily.  Marland Kitchen rOPINIRole (REQUIP) 2 MG tablet Take 1 tablet (2 mg total) by mouth 2 (two) times daily.  . vitamin C (ASCORBIC ACID) 500 MG tablet Take 500 mg by mouth at bedtime.  Marland Kitchen warfarin (COUMADIN) 5 MG tablet Take 1 tablet (5 mg total) by mouth daily at 6 PM. (Patient taking differently: Take 5-10 mg by mouth See admin instructions. Take 1 and 1/2 tablets on Tuesday and Thursday then take 1 tablet all the other days)   No facility-administered medications prior to visit.    Review of Systems  {Show previous labs (optional):23779::" "}   Objective    LMP  (LMP Unknown)  {Show previous vital signs (optional):23777::" "}  Physical Exam  ***  No results found  for any visits on 02/28/20.   Assessment & Plan    ***  No follow-ups on file.      {provider attestation***:1}   Lavon Paganini, MD  [facility] [phone] (phone) [phone] (fax)  [place]

## 2020-02-23 DIAGNOSIS — E039 Hypothyroidism, unspecified: Secondary | ICD-10-CM | POA: Diagnosis not present

## 2020-02-24 DIAGNOSIS — E039 Hypothyroidism, unspecified: Secondary | ICD-10-CM | POA: Diagnosis not present

## 2020-02-25 DIAGNOSIS — E039 Hypothyroidism, unspecified: Secondary | ICD-10-CM | POA: Diagnosis not present

## 2020-02-26 DIAGNOSIS — E039 Hypothyroidism, unspecified: Secondary | ICD-10-CM | POA: Diagnosis not present

## 2020-02-27 DIAGNOSIS — E7800 Pure hypercholesterolemia, unspecified: Secondary | ICD-10-CM | POA: Diagnosis not present

## 2020-02-27 DIAGNOSIS — Z9049 Acquired absence of other specified parts of digestive tract: Secondary | ICD-10-CM | POA: Diagnosis not present

## 2020-02-27 DIAGNOSIS — Z888 Allergy status to other drugs, medicaments and biological substances status: Secondary | ICD-10-CM | POA: Diagnosis not present

## 2020-02-27 DIAGNOSIS — M1990 Unspecified osteoarthritis, unspecified site: Secondary | ICD-10-CM | POA: Diagnosis not present

## 2020-02-27 DIAGNOSIS — Z79899 Other long term (current) drug therapy: Secondary | ICD-10-CM | POA: Diagnosis not present

## 2020-02-27 DIAGNOSIS — Z90710 Acquired absence of both cervix and uterus: Secondary | ICD-10-CM | POA: Diagnosis not present

## 2020-02-27 DIAGNOSIS — R079 Chest pain, unspecified: Secondary | ICD-10-CM | POA: Diagnosis not present

## 2020-02-27 DIAGNOSIS — G629 Polyneuropathy, unspecified: Secondary | ICD-10-CM | POA: Diagnosis not present

## 2020-02-27 DIAGNOSIS — R112 Nausea with vomiting, unspecified: Secondary | ICD-10-CM | POA: Diagnosis not present

## 2020-02-27 DIAGNOSIS — Z86718 Personal history of other venous thrombosis and embolism: Secondary | ICD-10-CM | POA: Diagnosis not present

## 2020-02-27 DIAGNOSIS — I10 Essential (primary) hypertension: Secondary | ICD-10-CM | POA: Diagnosis not present

## 2020-02-27 DIAGNOSIS — F319 Bipolar disorder, unspecified: Secondary | ICD-10-CM | POA: Diagnosis not present

## 2020-02-27 DIAGNOSIS — Z853 Personal history of malignant neoplasm of breast: Secondary | ICD-10-CM | POA: Diagnosis not present

## 2020-02-27 DIAGNOSIS — Z20822 Contact with and (suspected) exposure to covid-19: Secondary | ICD-10-CM | POA: Diagnosis not present

## 2020-02-27 DIAGNOSIS — F419 Anxiety disorder, unspecified: Secondary | ICD-10-CM | POA: Diagnosis not present

## 2020-02-27 DIAGNOSIS — E039 Hypothyroidism, unspecified: Secondary | ICD-10-CM | POA: Diagnosis not present

## 2020-02-28 ENCOUNTER — Ambulatory Visit: Payer: Medicaid Other | Admitting: Family Medicine

## 2020-02-28 DIAGNOSIS — R05 Cough: Secondary | ICD-10-CM | POA: Diagnosis not present

## 2020-02-28 DIAGNOSIS — R0989 Other specified symptoms and signs involving the circulatory and respiratory systems: Secondary | ICD-10-CM | POA: Diagnosis not present

## 2020-02-28 DIAGNOSIS — J029 Acute pharyngitis, unspecified: Secondary | ICD-10-CM | POA: Diagnosis not present

## 2020-02-28 DIAGNOSIS — H04203 Unspecified epiphora, bilateral lacrimal glands: Secondary | ICD-10-CM | POA: Diagnosis not present

## 2020-02-28 DIAGNOSIS — R1110 Vomiting, unspecified: Secondary | ICD-10-CM | POA: Diagnosis not present

## 2020-02-29 ENCOUNTER — Ambulatory Visit: Payer: Medicaid Other

## 2020-02-29 ENCOUNTER — Telehealth: Payer: Self-pay

## 2020-02-29 NOTE — Telephone Encounter (Signed)
Copied from [place] [id number]. Topic: General - Other >> Feb 29, 2020  3:47 PM Jane [name] wrote: Reason for CRM: Medicaid does not cover shingles vaccine for Pt under 54 years of age, but they advised Pt to have Dr. B send a referral to get it approved / please advise / Pt has shingles appt on may 4th

## 2020-03-01 NOTE — Telephone Encounter (Signed)
Patient advised.

## 2020-03-01 NOTE — Telephone Encounter (Signed)
I don't know where I would send a referral to for a shingrix.  If it is not covered, then it is not covered

## 2020-03-04 DIAGNOSIS — G4733 Obstructive sleep apnea (adult) (pediatric): Secondary | ICD-10-CM | POA: Diagnosis not present

## 2020-03-07 ENCOUNTER — Encounter: Payer: Self-pay | Admitting: Family Medicine

## 2020-03-07 ENCOUNTER — Other Ambulatory Visit: Payer: Self-pay

## 2020-03-07 ENCOUNTER — Ambulatory Visit (INDEPENDENT_AMBULATORY_CARE_PROVIDER_SITE_OTHER): Payer: Medicaid Other | Admitting: Family Medicine

## 2020-03-07 DIAGNOSIS — E1122 Type 2 diabetes mellitus with diabetic chronic kidney disease: Secondary | ICD-10-CM

## 2020-03-07 DIAGNOSIS — Z23 Encounter for immunization: Secondary | ICD-10-CM

## 2020-03-07 DIAGNOSIS — E1121 Type 2 diabetes mellitus with diabetic nephropathy: Secondary | ICD-10-CM

## 2020-03-07 DIAGNOSIS — Z86718 Personal history of other venous thrombosis and embolism: Secondary | ICD-10-CM | POA: Diagnosis not present

## 2020-03-07 DIAGNOSIS — N1831 Chronic kidney disease, stage 3a: Secondary | ICD-10-CM

## 2020-03-07 LAB — POCT INR
INR: 4.5 — AB (ref 2.0–3.0)
PT: 54.2

## 2020-03-07 NOTE — Patient Instructions (Signed)
Hold today then change dose to 5mg  daily except 7.5mg  on Tuesdays. Recheck in two weeks

## 2020-03-07 NOTE — Progress Notes (Signed)
Patient here for Tdap and Pneumovax vaccination and INR check only.  I did not examine the patient.  I did review his medical history, medications, and allergies and vaccine consent form.  CMA gave vaccination. Patient tolerated well.  See separate documentation regarding INR.   [name], MD, MPH [facility] 03/07/2020 12:36 PM

## 2020-03-09 DIAGNOSIS — Z853 Personal history of malignant neoplasm of breast: Secondary | ICD-10-CM | POA: Diagnosis not present

## 2020-03-09 DIAGNOSIS — F319 Bipolar disorder, unspecified: Secondary | ICD-10-CM | POA: Diagnosis not present

## 2020-03-09 DIAGNOSIS — E039 Hypothyroidism, unspecified: Secondary | ICD-10-CM | POA: Diagnosis not present

## 2020-03-09 DIAGNOSIS — G8929 Other chronic pain: Secondary | ICD-10-CM | POA: Diagnosis not present

## 2020-03-09 DIAGNOSIS — Z9049 Acquired absence of other specified parts of digestive tract: Secondary | ICD-10-CM | POA: Diagnosis not present

## 2020-03-09 DIAGNOSIS — S8002XA Contusion of left knee, initial encounter: Secondary | ICD-10-CM | POA: Diagnosis not present

## 2020-03-09 DIAGNOSIS — M25562 Pain in left knee: Secondary | ICD-10-CM | POA: Diagnosis not present

## 2020-03-09 DIAGNOSIS — Z90710 Acquired absence of both cervix and uterus: Secondary | ICD-10-CM | POA: Diagnosis not present

## 2020-03-09 DIAGNOSIS — E1140 Type 2 diabetes mellitus with diabetic neuropathy, unspecified: Secondary | ICD-10-CM | POA: Diagnosis not present

## 2020-03-09 DIAGNOSIS — Z888 Allergy status to other drugs, medicaments and biological substances status: Secondary | ICD-10-CM | POA: Diagnosis not present

## 2020-03-09 DIAGNOSIS — E7800 Pure hypercholesterolemia, unspecified: Secondary | ICD-10-CM | POA: Diagnosis not present

## 2020-03-09 DIAGNOSIS — Z86718 Personal history of other venous thrombosis and embolism: Secondary | ICD-10-CM | POA: Diagnosis not present

## 2020-03-09 DIAGNOSIS — I10 Essential (primary) hypertension: Secondary | ICD-10-CM | POA: Diagnosis not present

## 2020-03-09 DIAGNOSIS — D688 Other specified coagulation defects: Secondary | ICD-10-CM | POA: Diagnosis not present

## 2020-03-09 DIAGNOSIS — Z79899 Other long term (current) drug therapy: Secondary | ICD-10-CM | POA: Diagnosis not present

## 2020-03-13 ENCOUNTER — Ambulatory Visit (INDEPENDENT_AMBULATORY_CARE_PROVIDER_SITE_OTHER): Payer: Medicaid Other | Admitting: Physician Assistant

## 2020-03-13 ENCOUNTER — Encounter: Payer: Self-pay | Admitting: Physician Assistant

## 2020-03-13 DIAGNOSIS — M5442 Lumbago with sciatica, left side: Secondary | ICD-10-CM | POA: Diagnosis not present

## 2020-03-13 DIAGNOSIS — M5126 Other intervertebral disc displacement, lumbar region: Secondary | ICD-10-CM | POA: Diagnosis not present

## 2020-03-13 DIAGNOSIS — G8929 Other chronic pain: Secondary | ICD-10-CM | POA: Diagnosis not present

## 2020-03-13 DIAGNOSIS — M5441 Lumbago with sciatica, right side: Secondary | ICD-10-CM

## 2020-03-13 MED ORDER — HYDROCODONE-ACETAMINOPHEN 10-325 MG PO TABS: 1.0000 | ORAL_TABLET | Freq: Four times a day (QID) | ORAL | 0 refills | Status: AC | PRN

## 2020-03-13 MED ORDER — METHYLPREDNISOLONE 4 MG PO TBPK: ORAL_TABLET | ORAL | 0 refills | Status: DC

## 2020-03-13 NOTE — Patient Instructions (Signed)
Back Injury Prevention Back injuries can be very painful. They can also be difficult to heal. After having one back injury, you are more likely to have another one again. It is important to learn how to avoid injuring or re-injuring your back. The following tips can help you to prevent a back injury. What actions can I take to prevent back injuries? Nutrition changes Talk with your health care provider about your overall diet, and especially about foods that strengthen your bones.  Ask your health care provider how much calcium and vitamin D you need each day. These nutrients help to prevent weakening of the bones (osteoporosis). Osteoporosis can cause broken (fractured) bones, which lead to back pain.  Eat foods that are good sources of calcium. These include dairy products, green leafy vegetables, and products that have had calcium added to them (fortified).  Eat foods that are good sources of vitamin D. These include milk and foods that are fortified with vitamin D.  If needed, take supplements and vitamins as directed by your health care provider. Physical fitness Physical fitness strengthens your bones and your muscles. It also increases your balance and strength.  Exercise for 30 minutes per day on most days of the week, or as directed by your health care provider. Make sure to: ? Do aerobic exercises, such as walking, jogging, biking, or swimming. ? Do exercises that increase balance and strength, such as tai chi and yoga. These can decrease your risk of falling and injuring your back. ? Do stretching exercises to help with flexibility. ? Develop strong abdominal muscles. Your abdominal muscles provide a lot of the support that your back needs.  Maintain a healthy weight. This helps to decrease your risk of a back injury. Good posture        Prevent back injuries by developing and maintaining a good posture. To do this successfully:  Sit up and stand up straight. Avoid leaning  forward when you sit or hunching over when you stand.  Choose chairs that have good low-back (lumbar) support.  If you work at a desk, sit close to it so you do not need to lean over. Keep your chin tucked in. Keep your neck drawn back, and keep your elbows bent at a right angle.  Sit high and close to the steering wheel when you drive. Add a lumbar support to your car seat, if needed.  Avoid sitting or standing in one position for very long. Take breaks to get up, stretch, and walk around at least one time every hour. Take breaks every hour if you are driving for long periods of time.  Sleep on your side with your knees slightly bent, or sleep on your back with a pillow under your knees.  Lifting, twisting, and reaching Back injuries are more likely to occur when carrying loads and twisting at the same time. When you bend and lift, or reach for items that are high up in shelves, use positions that put less stress on your back.  Heavy lifting ? Avoid heavy lifting, especially the kind of heavy lifting that is repetitive. If you must do heavy lifting:  Stretch before lifting.  Work slowly.  Rest between lifts.  Use a tool such as a cart or a dolly to move objects.  Make several small trips instead of carrying one heavy load.  Ask for help when you need it, especially when moving big or heavy objects. ? Follow these steps when lifting:  Stand with your feet   shoulder-width apart.  Get as close to the object as you can. Do not try to pick up a heavy object that is far from your body.  Use handles or lifting straps if they are available.  Bend at your knees. Squat down, but keep your heels off the floor.  Keep your shoulders pulled back, your chin tucked in, and your back straight.  Lift the object slowly while you tighten the muscles in your legs, abdomen, and buttocks. Keep the object as close to the center of your body as possible. ? Follow these steps when putting down a  heavy load:  Stand with your feet shoulder-width apart.  Lower the object slowly while you tighten the muscles in your legs, abdomen, and buttocks. Keep the object as close to the center of your body as possible.  Keep your shoulders pulled back, your chin tucked in, and your back straight.  Bend at your knees. Squat down, but keep your heels off the floor.  Use handles or lifting straps if they are available.  Twisting and reaching ? Avoid lifting heavy objects above your waist. ? Do not twist at your waist while you are lifting or carrying a load. If you need to turn, move your feet. ? Do not bend over without bending at your knees. ? Avoid reaching over your head, across a table, or for an object on a high surface.  Other changes   Avoid wet floors and icy ground. Keep sidewalks clear of ice to prevent falls.  Do not sleep on a mattress that is too soft or too hard.  Put heavier objects on shelves at waist level, and put lighter objects on lower or higher shelves.  Find ways to decrease your stress, such as by exercising, getting a massage, or practicing relaxation techniques. Stress can build up in your muscles. Tense muscles are more vulnerable to injury.  Talk with your health care provider if you feel anxious or depressed. These conditions can make back pain worse.  Wear flat heel shoes with cushioned soles.  Use both shoulder straps when carrying a backpack.  Do not use any products that contain nicotine or tobacco, such as cigarettes and e-cigarettes. If you need help quitting, ask your health care provider. Summary  Back injuries can be very painful and difficult to heal.  You can prevent injuring or re-injuring your back by making nutrition changes, working on being physically fit, developing a good posture, and lifting heavy objects in a safe way.  Making other changes can also help to prevent back injuries. These include eating a healthy diet, exercising  regularly and maintaining a healthy weight. This information is not intended to replace advice given to you by your health care provider. Make sure you discuss any questions you have with your health care provider. Document Revised: 07/14/2019 Document Reviewed: 12/06/2017 Elsevier Patient Education  2020 [employer].

## 2020-03-13 NOTE — Progress Notes (Signed)
MyChart Video Visit    Virtual Visit via Video Note   This visit type was conducted due to national recommendations for restrictions regarding the COVID-19 Pandemic (e.g. social distancing) in an effort to limit this patient's exposure and mitigate transmission in our community. This patient is at least at moderate risk for complications without adequate follow up. This format is felt to be most appropriate for this patient at this time. Physical exam was limited by quality of the video and audio technology used for the visit.   Patient location: Home Provider location: [facility]   Patient: Jane [name]   DOB: 08-09-1966   54 y.o. Female  MRN: [id number] Visit Date: 03/13/2020  Today's healthcare provider: Mar Daring, PA-C   Chief Complaint  Patient presents with  . Back Pain   Subjective    Back Pain This is a chronic problem. The problem has been gradually worsening (Especially since 03/10/2020) since onset. The pain is present in the lumbar spine. Radiates to: Radiating down left leg. The symptoms are aggravated by sitting, lying down and standing. Associated symptoms include leg pain and weakness. Pertinent negatives include no bladder incontinence, bowel incontinence, numbness or tingling. She has tried ice, heat, muscle relaxant and NSAIDs for the symptoms. The treatment provided no relief.  Aggravated chronic issues on 03/10/20 with push mowing her lawn and weed eating. Has been seen by multiple neurosurgeon and pain management.   Patient Active Problem List   Diagnosis Date Noted  . Bilateral hand swelling 02/16/2020  . Thrush 01/20/2020  . Morbid obesity ([place]) 10/25/2019  . History of back surgery 10/25/2019  . Peripheral neuropathy 10/25/2019  . Restless leg syndrome 10/25/2019  . Chronic bilateral low back pain with bilateral sciatica 10/25/2019  . Essential hypertension 10/25/2019  . Type 2 diabetes mellitus with stage 3 chronic kidney disease, without  long-term current use of insulin ([place]) 10/25/2019  . Chronic anticoagulation 10/25/2019  . Hyperlipidemia associated with type 2 diabetes mellitus ([place]) 10/25/2019  . CKD stage 3 due to type 2 diabetes mellitus ([place]) 10/25/2019  . GERD (gastroesophageal reflux disease) 10/25/2019  . Allergic rhinitis 10/25/2019  . Chronic constipation 10/25/2019  . Hypothyroidism   . Normocytic anemia 09/15/2019  . Wound of left groin 04/27/2019  . 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 Chronic venous insufficiency 11/21/2017  . Displacement of lumbar intervertebral disc without myelopathy 07/14/2017  . Radicular low back pain 05/25/2014  . Depression, major, recurrent ([place]) 06/17/2013  . OCD (obsessive compulsive disorder) 11/20/2012  . 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 Bipolar 1 disorder ([place]) 09/25/2012  . History of recurrent deep vein thrombosis (DVT) 08/18/2012   Past Medical History:  Diagnosis Date  . Anxiety   . Bipolar 1 disorder ([place])   . Bipolar disorder ([place])   . Chronic kidney disease    Stage 3 kidney disease;dx by Dr. Sinda Du.   . Constipation   . Depression   . Diabetes mellitus without complication ([place])   . Dyspnea    with exertion  . GERD (gastroesophageal reflux disease)   . Headache    migraines  . High cholesterol   . History of blood clots   . History of DVT (deep vein thrombosis)    left leg  . Hypertension    states under control with meds., has been on med. x 2 years  . Hypothyroidism   . Neuropathy   . Obsessive-compulsive disorder   .  Peripheral vascular disease ([place])   . Respiratory failure requiring intubation ([place])   . Restless leg syndrome   . Thrombocytopenia ([place]) 09/15/2019  . Trigger thumb of left hand 01/2018  . Trigger thumb of right hand       Medications: Outpatient Medications Prior to Visit  Medication Sig  . ALPRAZolam (XANAX) 1 MG tablet Take 1 tablet (1 mg total) by mouth 2  (two) times daily as needed for anxiety.  Marland Kitchen amphetamine-dextroamphetamine (ADDERALL) 30 MG tablet Take 1 tablet by mouth 2 (two) times daily.  Marland Kitchen aspirin EC 81 MG tablet Take 81 mg by mouth daily.  Marland Kitchen atorvastatin (LIPITOR) 20 MG tablet Take 1 tablet (20 mg total) by mouth daily.  . cetirizine (ZYRTEC) 10 MG tablet Take 1 tablet (10 mg total) by mouth daily.  . cyclobenzaprine (FLEXERIL) 5 MG tablet Take 10 mg by mouth 3 (three) times daily.   . cycloSPORINE (RESTASIS) 0.05 % ophthalmic emulsion Place 1 drop into both eyes 2 (two) times daily.  Marland Kitchen FLUoxetine (PROZAC) 40 MG capsule Take 1 capsule (40 mg total) by mouth daily.  Marland Kitchen GARLIC PO Take by mouth.  . hydrochlorothiazide (HYDRODIURIL) 25 MG tablet Take 1 tablet (25 mg total) by mouth daily.  Marland Kitchen levothyroxine (SYNTHROID) 175 MCG tablet Take 1 tablet (175 mcg total) by mouth daily before breakfast.  . linaclotide (LINZESS) 145 MCG CAPS capsule Take 1 capsule (145 mcg total) by mouth daily.  Marland Kitchen lisinopril (ZESTRIL) 20 MG tablet Take 1 tablet (20 mg total) by mouth daily.  . Olopatadine HCl 0.2 % SOLN Apply 1 drop to eye daily.  . pantoprazole (PROTONIX) 40 MG tablet Take 1 tablet (40 mg total) by mouth daily.  . potassium chloride (KLOR-CON) 10 MEQ tablet Take 1 tablet (10 mEq total) by mouth 3 (three) times daily.  . pregabalin (LYRICA) 100 MG capsule Take 100 mg by mouth 4 (four) times daily.  Marland Kitchen rOPINIRole (REQUIP) 2 MG tablet Take 1 tablet (2 mg total) by mouth 2 (two) times daily.  . vitamin C (ASCORBIC ACID) 500 MG tablet Take 500 mg by mouth at bedtime.  Marland Kitchen warfarin (COUMADIN) 5 MG tablet Take 1 tablet (5 mg total) by mouth daily at 6 PM. (Patient taking differently: Take 5-10 mg by mouth See admin instructions. Take 1 and 1/2 tablets on Tuesday and Thursday then take 1 tablet all the other days)  . nystatin (MYCOSTATIN) 100000 UNIT/ML suspension Take 5 mLs (500,000 Units total) by mouth 4 (four) times daily.   No facility-administered  medications prior to visit.    Last CBC Lab Results  Component Value Date   WBC 7.7 01/05/2020   HGB 14.2 01/05/2020   HCT 43.5 01/05/2020   MCV 92.6 01/05/2020   MCH 30.2 01/05/2020   RDW 17.1 (H) 01/05/2020   PLT 158 [id number]   Last metabolic panel Lab Results  Component Value Date   GLUCOSE 130 (H) 02/16/2020   NA 142 02/16/2020   K 4.3 02/16/2020   CL 103 02/16/2020   CO2 25 02/16/2020   BUN 31 (H) 02/16/2020   CREATININE 1.16 (H) 02/16/2020   GFRNONAA 54 (L) 02/16/2020   GFRAA 62 02/16/2020   CALCIUM 9.5 02/16/2020   PHOS 4.0 04/04/2019   PROT 7.1 02/16/2020   ALBUMIN 4.2 02/16/2020   LABGLOB 2.9 02/16/2020   AGRATIO 1.4 02/16/2020   BILITOT 0.4 02/16/2020   ALKPHOS 122 (H) 02/16/2020   AST 20 02/16/2020   ALT 23 02/16/2020   ANIONGAP  12 01/05/2020      Review of Systems  Constitutional: Negative.   Respiratory: Negative.   Cardiovascular: Negative.   Gastrointestinal: Negative for bowel incontinence.  Genitourinary: Negative for bladder incontinence.  Musculoskeletal: Positive for back pain, gait problem and myalgias. Negative for arthralgias, joint swelling, neck pain and neck stiffness.  Neurological: Positive for weakness. Negative for tingling and numbness.       Objective    LMP  (LMP Unknown)  BP Readings from Last 3 Encounters:  02/16/20 118/75  01/20/20 114/74  01/12/20 129/66   Wt Readings from Last 3 Encounters:  02/16/20 285 lb (129.3 kg)  01/20/20 281 lb (127.5 kg)  01/12/20 275 lb (124.7 kg)      Physical Exam Vitals reviewed.  Constitutional:      General: She is not in acute distress.    Appearance: Normal appearance. She is well-developed. She is obese. She is not ill-appearing.  HENT:     Head: Normocephalic and atraumatic.  Pulmonary:     Effort: Pulmonary effort is normal. No respiratory distress.  Musculoskeletal:     Cervical back: Normal range of motion and neck supple.  Neurological:     Mental Status: She is  alert.  Psychiatric:        Mood and Affect: Mood normal.        Behavior: Behavior normal.        Thought Content: Thought content normal.        Judgment: Judgment normal.       Assessment & Plan     1. Displacement of lumbar intervertebral disc without myelopathy Acute on chronic issue. Will give medrol as below. Continue Flexeril. Continue heat and ice. Norco 10-325mg  given as below for acute severe pain. New referral to local pain clinic placed at patient's request. Declines wanting surgery, but is willing for other pain management options. Case complicated by fact patient on lifelong warfarin due to recurrent DVT.  - methylPREDNISolone (MEDROL) 4 MG TBPK tablet; 6 day taper; take as directed on package instructions  Dispense: 21 tablet; Refill: 0 - HYDROcodone-acetaminophen (NORCO) 10-325 MG tablet; Take 1 tablet by mouth every 6 (six) hours as needed for up to 5 days.  Dispense: 20 tablet; Refill: 0 - Ambulatory referral to Pain Clinic  2. Chronic bilateral low back pain with bilateral sciatica See above medical treatment plan. - methylPREDNISolone (MEDROL) 4 MG TBPK tablet; 6 day taper; take as directed on package instructions  Dispense: 21 tablet; Refill: 0 - HYDROcodone-acetaminophen (NORCO) 10-325 MG tablet; Take 1 tablet by mouth every 6 (six) hours as needed for up to 5 days.  Dispense: 20 tablet; Refill: 0 - Ambulatory referral to Pain Clinic   No follow-ups on file.     I discussed the assessment and treatment plan with the patient. The patient was provided an opportunity to ask questions and all were answered. The patient agreed with the plan and demonstrated an understanding of the instructions.   The patient was advised to call back or seek an in-person evaluation if the symptoms worsen or if the condition fails to improve as anticipated.  I provided 12 minutes of non-face-to-face time during this encounter.  Reynolds Bowl, PA-C, have reviewed all  documentation for this visit. The documentation on 03/13/20 for the exam, diagnosis, procedures, and orders are all accurate and complete.  [place] Beach [facility] [phone] (phone) [phone] (fax)  [place]

## 2020-03-15 DIAGNOSIS — M79642 Pain in left hand: Secondary | ICD-10-CM | POA: Diagnosis not present

## 2020-03-15 DIAGNOSIS — M791 Myalgia, unspecified site: Secondary | ICD-10-CM | POA: Diagnosis not present

## 2020-03-15 DIAGNOSIS — Z1159 Encounter for screening for other viral diseases: Secondary | ICD-10-CM | POA: Diagnosis not present

## 2020-03-15 DIAGNOSIS — M7989 Other specified soft tissue disorders: Secondary | ICD-10-CM | POA: Diagnosis not present

## 2020-03-15 DIAGNOSIS — M79641 Pain in right hand: Secondary | ICD-10-CM | POA: Diagnosis not present

## 2020-03-15 DIAGNOSIS — M2550 Pain in unspecified joint: Secondary | ICD-10-CM | POA: Diagnosis not present

## 2020-03-21 DIAGNOSIS — S86092D Other specified injury of left Achilles tendon, subsequent encounter: Secondary | ICD-10-CM | POA: Diagnosis not present

## 2020-03-23 ENCOUNTER — Ambulatory Visit: Payer: Medicaid Other | Admitting: Orthopedic Surgery

## 2020-03-23 ENCOUNTER — Telehealth (HOSPITAL_COMMUNITY): Payer: Self-pay

## 2020-03-23 NOTE — Telephone Encounter (Signed)

## 2020-03-24 ENCOUNTER — Ambulatory Visit (INDEPENDENT_AMBULATORY_CARE_PROVIDER_SITE_OTHER): Payer: Medicaid Other | Admitting: Vascular Surgery

## 2020-03-24 ENCOUNTER — Encounter: Payer: Self-pay | Admitting: Vascular Surgery

## 2020-03-24 ENCOUNTER — Ambulatory Visit (HOSPITAL_COMMUNITY)
Admission: RE | Admit: 2020-03-24 | Discharge: 2020-03-24 | Disposition: A | Discharge status: Home / Self Care | Payer: Medicaid Other | Source: Ambulatory Visit | Attending: Vascular Surgery | Admitting: Vascular Surgery

## 2020-03-24 ENCOUNTER — Other Ambulatory Visit: Payer: Self-pay

## 2020-03-24 VITALS — BP 136/69 | HR 62 | Temp 98.1°F | Resp 16 | Ht 70.0 in | Wt 280.0 lb

## 2020-03-24 DIAGNOSIS — I87002 Postthrombotic syndrome without complications of left lower extremity: Secondary | ICD-10-CM

## 2020-03-24 DIAGNOSIS — I871 Compression of vein: Secondary | ICD-10-CM | POA: Insufficient documentation

## 2020-03-24 NOTE — Progress Notes (Signed)
Patient ID: Jane [name], female   DOB: 01/23/1966, 54 y.o.   MRN: [id number]  Reason for Consult: Follow-up   Referred by Sinda Du, MD  Subjective:     HPI:  Jane [name] is a 54 y.o. female with extensive left lower extremity venous history.  Now she has stenting from her common iliac vein all the way through her femoral vein.  She has covered stents down to the level of the common femoral vein due to open procedure with rupture.  She also has a previous left to right femorofemoral venous bypass and although this has occluded she has a patent AV fistula there.  Swelling at this time has really significantly improved except for the swelling in the thigh which was considered postsurgical.  She remains on Coumadin.  She has pain in the anterior thighs.  She states that she is having thyroid issues is also being seen by rheumatology.  Pain is bilaterally.  Does not appear related to her back as she is previously had back surgery but this was for sciatic issues.  She has no skin changes in her bilateral lower extremities.  She is having difficulty walking due to the pain in her legs.  She has no other proximal muscle weaknesses or pain.  Past Medical History:  Diagnosis Date  . Anxiety   . Bipolar 1 disorder ([place])   . Bipolar disorder ([place])   . Chronic kidney disease    Stage 3 kidney disease;dx by Dr. Sinda Du.   . Constipation   . Depression   . Diabetes mellitus without complication ([place])   . Dyspnea    with exertion  . GERD (gastroesophageal reflux disease)   . Headache    migraines  . High cholesterol   . History of blood clots   . History of DVT (deep vein thrombosis)    left leg  . Hypertension    states under control with meds., has been on med. x 2 years  . Hypothyroidism   . Neuropathy   . Obsessive-compulsive disorder   . Peripheral vascular disease ([place])   . Respiratory failure requiring intubation ([place])   . Restless leg syndrome   .  Thrombocytopenia ([place]) 09/15/2019  . Trigger thumb of left hand 01/2018  . Trigger thumb of right hand    Family History  Problem Relation Age of Onset  . Heart disease Mother   . Hyperlipidemia Mother   . Hypertension Mother   . Bipolar disorder Mother   . Diabetes Father   . Heart disease Father   . Hyperlipidemia Father   . Hypertension Father   . Drug abuse Daughter   . ADD / ADHD Daughter   . Drug abuse Daughter   . Anxiety disorder Daughter   . Bipolar disorder Daughter   . Hypertension Sister   . Hypertension Brother   . Hyperlipidemia Brother   . Heart disease Brother   . Bipolar disorder Maternal Aunt   . Suicidality Maternal Aunt    Past Surgical History:  Procedure Laterality Date  . ABDOMINAL HYSTERECTOMY  06/2016   complete  . APPLICATION OF WOUND VAC Left 04/27/2019   Procedure: APPLICATION OF WOUND VAC LEFT GROIN;  Surgeon: Waynetta Sandy, MD;  Location: [place];  Service: Vascular;  Laterality: Left;  . AV FISTULA PLACEMENT Left 11/24/2018   Procedure: ARTERIOVENOUS (AV) FISTULA CREATION LEFT SFA TO LEFT FEMORAL VEIN;  Surgeon: Waynetta Sandy, MD;  Location: [place];  Service: Vascular;  Laterality:  Left;  . BACK SURGERY    . CHOLECYSTECTOMY    . COLONOSCOPY WITH PROPOFOL N/A 11/22/2019   Procedure: COLONOSCOPY WITH PROPOFOL;  Surgeon: [name], MD;  [facility] ENDOSCOPY;  Service: Gastroenterology;  Laterality: N/A;  . FEMORAL ARTERY EXPLORATION  03/30/2019   Procedure: Left Common Femoral Artery and Vein Exploration;  Surgeon: Waynetta Sandy, MD;  Location: [place];  Service: Vascular;;  . FEMORAL-FEMORAL BYPASS GRAFT Left 11/24/2018   Procedure: BYPASS GRAFT FEMORAL-FEMORAL VENOUS LEFT TO RIGHT PALMA PROCEDURE USING CRYOVEIN;  Surgeon: Waynetta Sandy, MD;  Location: [place];  Service: Vascular;  Laterality: Left;  . GROIN DEBRIDEMENT Left 04/27/2019   Procedure: GROIN DEBRIDEMENT;  Surgeon: Waynetta Sandy,  MD;  Location: [place];  Service: Vascular;  Laterality: Left;  . INSERTION OF ILIAC STENT  03/30/2019   Procedure: Stent of left common, external iliac veins and left common femoral vein;  Surgeon: Waynetta Sandy, MD;  Location: [place];  Service: Vascular;;  . LOWER EXTREMITY VENOGRAPHY N/A 08/17/2018   Procedure: LOWER EXTREMITY VENOGRAPHY - Central Venogram;  Surgeon: Waynetta Sandy, MD;  Location: [place] CV LAB;  Service: Cardiovascular;  Laterality: N/A;  . LOWER EXTREMITY VENOGRAPHY Bilateral 03/09/2019   Procedure: LOWER EXTREMITY VENOGRAPHY;  Surgeon: Waynetta Sandy, MD;  Location: [place] CV [facility];  Service: Cardiovascular;  Laterality: Bilateral;  . LOWER EXTREMITY VENOGRAPHY Left 08/16/2019   Procedure: LOWER EXTREMITY VENOGRAPHY;  Surgeon: Waynetta Sandy, MD;  Location: [place] CV [facility];  Service: Cardiovascular;  Laterality: Left;  . LUMBAR FUSION  11/21/2000   L5-S1  . LUMBAR SPINE SURGERY     x 2 others  . PATCH ANGIOPLASTY Left 03/30/2019   Procedure: Patch Angioplasty of the Left Common Femoral Vein using Venosure Biologic patch;  Surgeon: Waynetta Sandy, MD;  Location: [place];  Service: Vascular;  Laterality: Left;  . PERIPHERAL VASCULAR INTERVENTION Left 08/16/2019   Procedure: PERIPHERAL VASCULAR INTERVENTION;  Surgeon: Waynetta Sandy, MD;  Location: [place] CV [facility];  Service: Cardiovascular;  Laterality: Left;  common femoral/femoral vein stent  . TRIGGER FINGER RELEASE Right 12/01/2017   Procedure: RELEASE TRIGGER FINGER/A-1 PULLEY RIGHT THUMB;  Surgeon: Leanora Cover, MD;  Location: [place];  Service: Orthopedics;  Laterality: Right;  . TRIGGER FINGER RELEASE Left 01/26/2018   Procedure: LEFT TRIGGER THUMB RELEASE;  Surgeon: Leanora Cover, MD;  Location: [place];  Service: Orthopedics;  Laterality: Left;  . ULTRASOUND GUIDANCE FOR VASCULAR ACCESS Right 03/30/2019    Procedure: Ultrasound-guided cannulation right internal jugular vein;  Surgeon: Waynetta Sandy, MD;  Location: [place];  Service: Vascular;  Laterality: Right;    Short Social History:  Social History   Tobacco Use  . Smoking status: Never Smoker  . Smokeless tobacco: Never Used  Substance Use Topics  . Alcohol use: No    Allergies  Allergen Reactions  . Aripiprazole Other (See Comments)    BECOMES  VIOLENT   . Seroquel [Quetiapine Fumarate] Other (See Comments)    BECOMES VIOLENT  . Chlorpromazine Other (See Comments)    SEVERE ANXIETY   . Gabapentin Other (See Comments)    NIGHTMARES     Current Outpatient Medications  Medication Sig Dispense Refill  . ALPRAZolam (XANAX) 1 MG tablet Take 1 tablet (1 mg total) by mouth 2 (two) times daily as needed for anxiety. 60 tablet 2  . amphetamine-dextroamphetamine (ADDERALL) 30 MG tablet Take 1 tablet by mouth 2 (  two) times daily. 60 tablet 0  . aspirin EC 81 MG tablet Take 81 mg by mouth daily.    Marland Kitchen atorvastatin (LIPITOR) 20 MG tablet Take 1 tablet (20 mg total) by mouth daily. 90 tablet 3  . cetirizine (ZYRTEC) 10 MG tablet Take 1 tablet (10 mg total) by mouth daily. 90 tablet 1  . cyclobenzaprine (FLEXERIL) 5 MG tablet Take 10 mg by mouth 3 (three) times daily.     . cycloSPORINE (RESTASIS) 0.05 % ophthalmic emulsion Place 1 drop into both eyes 2 (two) times daily.    Marland Kitchen FLUoxetine (PROZAC) 40 MG capsule Take 1 capsule (40 mg total) by mouth daily. 30 capsule 2  . GARLIC PO Take by mouth.    . hydrochlorothiazide (HYDRODIURIL) 25 MG tablet Take 1 tablet (25 mg total) by mouth daily. 90 tablet 3  . levothyroxine (SYNTHROID) 175 MCG tablet Take 1 tablet (175 mcg total) by mouth daily before breakfast. 90 tablet 1  . linaclotide (LINZESS) 145 MCG CAPS capsule Take 1 capsule (145 mcg total) by mouth daily. 90 capsule 3  . lisinopril (ZESTRIL) 20 MG tablet Take 1 tablet (20 mg total) by mouth daily. 30 tablet 3  . nystatin  (MYCOSTATIN) 100000 UNIT/ML suspension Take 5 mLs (500,000 Units total) by mouth 4 (four) times daily. 60 mL 0  . Olopatadine HCl 0.2 % SOLN Apply 1 drop to eye daily.    . pantoprazole (PROTONIX) 40 MG tablet Take 1 tablet (40 mg total) by mouth daily. 90 tablet 3  . potassium chloride (KLOR-CON) 10 MEQ tablet Take 1 tablet (10 mEq total) by mouth 3 (three) times daily. 270 tablet 3  . pregabalin (LYRICA) 100 MG capsule Take 100 mg by mouth 4 (four) times daily.    Marland Kitchen rOPINIRole (REQUIP) 2 MG tablet Take 1 tablet (2 mg total) by mouth 2 (two) times daily. 180 tablet 3  . vitamin C (ASCORBIC ACID) 500 MG tablet Take 500 mg by mouth at bedtime.    Marland Kitchen warfarin (COUMADIN) 5 MG tablet Take 1 tablet (5 mg total) by mouth daily at 6 PM. (Patient taking differently: Take 5-10 mg by mouth See admin instructions. Take 1 and 1/2 tablets on Tuesday and Thursday then take 1 tablet all the other days) 30 tablet 6  . methylPREDNISolone (MEDROL) 4 MG TBPK tablet 6 day taper; take as directed on package instructions 21 tablet 0   No current facility-administered medications for this visit.    Review of Systems  Constitutional:  Constitutional negative. HENT: HENT negative.  Eyes: Eyes negative.  Respiratory: Positive for shortness of breath.  Cardiovascular: Positive for dyspnea with exertion and leg swelling.  GI: Gastrointestinal negative.  Musculoskeletal: Positive for gait problem and leg pain.  Skin: Skin negative.  Hematologic: Hematologic/lymphatic negative.  Psychiatric: Psychiatric negative.        Objective:  Objective   Vitals:   03/24/20 0917  BP: 136/69  Pulse: 62  Resp: 16  Temp: 98.1 F (36.7 C)  TempSrc: Temporal  SpO2: 96%  Weight: 280 lb (127 kg)  Height: 5\' 10"  (1.778 m)   Body mass index is 40.18 kg/m.  Physical Exam HENT:     Head: Normocephalic.     Nose:     Comments: Mask in place Eyes:     Pupils: Pupils are equal, round, and reactive to light.    Cardiovascular:     Rate and Rhythm: Normal rate.     Pulses: Normal pulses.  Pulmonary:  Effort: Pulmonary effort is normal.  Abdominal:     General: Abdomen is flat.     Palpations: Abdomen is soft. There is no mass.  Musculoskeletal:        General: Normal range of motion.     Cervical back: Normal range of motion and neck supple.     Comments: Right leg measures 40 cm left leg 41 cm 10 cm from the tibial tuberosity She does have stable left thigh enlargement but thigh is soft  Skin:    General: Skin is warm and dry.     Capillary Refill: Capillary refill takes less than 2 seconds.  Neurological:     General: No focal deficit present.     Mental Status: She is alert.  Psychiatric:        Mood and Affect: Mood normal.        Thought Content: Thought content normal.        Judgment: Judgment normal.     Data: I have independently interpreted her IVC iliac duplex.  This demonstrates communication between the left common femoral artery and left common femoral vein consistent with AV fistula.  Visualization of her IVC and proximal common external leg veins was limited due to body habitus.  All veins visualized are patent.     Assessment/Plan:     54 year old female with extensive left lower extremity venous history.  At this time her lower extremity really appears minimally edematous at 41 cm relative to 40 cm.  Swelling left thigh is likely postsurgical as this has been present since her most recent groin operation.  Her pain in her bilateral thighs does not appear to be venous or arterial in nature at this time.  She should continue Coumadin and if there needs to be any holding of Coumadin should be bridged with Lovenox.  I will plan for her to follow-up in 1 year with repeat IVC iliac duplex.  Should she have swelling in her left lower extremity prior to this I would need to at least have a phone visit with her to discuss the correct studies to order for her given her  complicated history.     Waynetta Sandy MD [facility]

## 2020-03-27 ENCOUNTER — Other Ambulatory Visit (HOSPITAL_COMMUNITY): Payer: Self-pay | Admitting: Psychiatry

## 2020-03-27 DIAGNOSIS — E039 Hypothyroidism, unspecified: Secondary | ICD-10-CM | POA: Diagnosis not present

## 2020-03-27 MED ORDER — AMPHETAMINE-DEXTROAMPHETAMINE 30 MG PO TABS: 30.0000 mg | ORAL_TABLET | Freq: Two times a day (BID) | ORAL | 0 refills | Status: DC

## 2020-03-28 ENCOUNTER — Ambulatory Visit (INDEPENDENT_AMBULATORY_CARE_PROVIDER_SITE_OTHER): Payer: Medicaid Other

## 2020-03-28 ENCOUNTER — Other Ambulatory Visit: Payer: Self-pay

## 2020-03-28 DIAGNOSIS — E039 Hypothyroidism, unspecified: Secondary | ICD-10-CM | POA: Diagnosis not present

## 2020-03-28 DIAGNOSIS — Z86718 Personal history of other venous thrombosis and embolism: Secondary | ICD-10-CM | POA: Diagnosis not present

## 2020-03-28 LAB — POCT INR
INR: 3.9 — AB (ref 2.0–3.0)
PT: 46.5

## 2020-03-28 NOTE — Patient Instructions (Signed)
Description   Current dose: 5 mg daily, except 7.5 mg on Tuesday. Today changes: Hold today,  Then take 5mg  daily. Return in 2 weeks.

## 2020-03-29 ENCOUNTER — Ambulatory Visit (INDEPENDENT_AMBULATORY_CARE_PROVIDER_SITE_OTHER): Payer: Medicaid Other | Admitting: Family Medicine

## 2020-03-29 ENCOUNTER — Encounter (INDEPENDENT_AMBULATORY_CARE_PROVIDER_SITE_OTHER): Payer: Self-pay | Admitting: Family Medicine

## 2020-03-29 VITALS — BP 119/70 | HR 67 | Temp 98.2°F | Ht 68.0 in | Wt 283.0 lb

## 2020-03-29 DIAGNOSIS — Z6841 Body Mass Index (BMI) 40.0 and over, adult: Secondary | ICD-10-CM

## 2020-03-29 DIAGNOSIS — E559 Vitamin D deficiency, unspecified: Secondary | ICD-10-CM | POA: Diagnosis not present

## 2020-03-29 DIAGNOSIS — Z1331 Encounter for screening for depression: Secondary | ICD-10-CM

## 2020-03-29 DIAGNOSIS — G4733 Obstructive sleep apnea (adult) (pediatric): Secondary | ICD-10-CM

## 2020-03-29 DIAGNOSIS — R0602 Shortness of breath: Secondary | ICD-10-CM | POA: Diagnosis not present

## 2020-03-29 DIAGNOSIS — Z0289 Encounter for other administrative examinations: Secondary | ICD-10-CM

## 2020-03-29 DIAGNOSIS — R5383 Other fatigue: Secondary | ICD-10-CM

## 2020-03-29 DIAGNOSIS — E039 Hypothyroidism, unspecified: Secondary | ICD-10-CM | POA: Diagnosis not present

## 2020-03-29 DIAGNOSIS — Z86718 Personal history of other venous thrombosis and embolism: Secondary | ICD-10-CM | POA: Diagnosis not present

## 2020-03-29 DIAGNOSIS — N1831 Chronic kidney disease, stage 3a: Secondary | ICD-10-CM

## 2020-03-29 DIAGNOSIS — E1165 Type 2 diabetes mellitus with hyperglycemia: Secondary | ICD-10-CM | POA: Diagnosis not present

## 2020-03-29 NOTE — Progress Notes (Signed)
Dear Dr. Brita Romp,   Thank you for referring Jane [name] to our clinic. The following note includes my evaluation and treatment recommendations.  Chief Complaint:   OBESITY Jane [name] (MR# [id number]) is a 54 y.o. female who presents for evaluation and treatment of obesity and related comorbidities. Current BMI is Body mass index is 43.03 kg/m. Jane [name] has been struggling with her weight for many years and has been unsuccessful in either losing weight, maintaining weight loss, or reaching her healthy weight goal.  Jane [name] is currently in the action stage of change and ready to dedicate time achieving and maintaining a healthier weight. Jane [name] is interested in becoming our patient and working on intensive lifestyle modifications including (but not limited to) diet and exercise for weight loss.  Jane [name] has 10 grandchildren that frequent her house.  For breakfast, she will have a bowl of cereal (CapNCrunch 3 cups) low fat or 1% milk 1 cup (drink milk and feels like she has eaten).  Between 1 and 2 hours later, she will have an individual bag of Lays.  For lunch, Lean Cuisine, and then 2-3 hours later, 3-4 cookies (Oreos).  Dinner will consist of Pre-made 1/4 lb burger on 2 slices of wheat bread, mayo, ketchup, pickles with mac & cheese (1 cup) and she will feel reasonably full.  After dinner, she will have crackers and peanut butter, cheese stick.  Jane [name]'s habits were reviewed today and are as follows: her desired weight loss is 82 pounds, she has been heavy most of her life, she started gaining weight at 54 years of age, her heaviest weight ever was 296 pounds, she snacks frequently in the evenings, she wakes up frequently in the middle of the night to eat, she skips breakfast frequently, she is frequently drinking liquids with calories, she frequently makes poor food choices, she frequently eats larger portions than normal and she struggles with emotional eating.  Depression  Screen Jane [name]'s Food and Mood (modified PHQ-9) score was 19.  Depression screen Jane [name] 2/9 03/29/2020  Decreased Interest 3  Down, Depressed, Hopeless 3  PHQ - 2 Score 6  Altered sleeping 3  Tired, decreased energy 3  Change in appetite 3  Feeling bad or failure about yourself  1  Trouble concentrating 1  Moving slowly or fidgety/restless 2  Suicidal thoughts 0  PHQ-9 Score 19  Difficult doing work/chores Extremely dIfficult  Some recent data might be hidden   Subjective:   1. Other fatigue Jane [name] admits to daytime somnolence and reports waking up still tired. Patent has a history of symptoms of daytime fatigue, morning fatigue, morning headache and snoring. Jane [name] generally gets 4 or 5 hours of sleep per night, and states that she has poor quality sleep. Snoring is present. Apneic episodes are not present. Epworth Sleepiness Score is 16.  2. SOB (shortness of breath) on exertion Jane [name] notes increasing shortness of breath with exercising and seems to be worsening over time with weight gain. She notes getting out of breath sooner with activity than she used to. This has gotten worse recently. Jane [name] denies shortness of breath at rest or orthopnea.  3. Type 2 diabetes mellitus with hyperglycemia, without long-term current use of insulin (Jane [name]) She is not on any medications for diabetes.  Last [name] was 6.3.  She has never had an eye exam.  Lab Results  Component Value Date   HGBA1C 6.3 (H) 10/25/2019   HGBA1C 6.3 (H) 03/26/2019   HGBA1C 7.5 11/18/2018   Lab Results  Component Value Date   LDLCALC 127 (H) 10/25/2019   CREATININE 1.16 (H) 02/16/2020   4. Stage 3a chronic kidney disease Jane [name] was diagnosed by Dr. Luan [name].   Lab Results  Component Value Date   CREATININE 1.16 (H) 02/16/2020   CREATININE 1.35 (H) 01/05/2020   CREATININE 1.12 (H) 10/25/2019   Lab Results  Component Value Date   CREATININE 1.16 (H) 02/16/2020   BUN 31 (H) 02/16/2020   NA 142 02/16/2020   K 4.3  02/16/2020   CL 103 02/16/2020   CO2 25 02/16/2020   5. History of DVT (deep vein thrombosis) Jane [name] is on chronic warfarin.  Last INR was 3.9.  6. OSA (obstructive sleep apnea) Jane [name] has a diagnosis of sleep apnea. She reports that she is not using a CPAP regularly.  She says she has a machine at home but does not feel that it works so she is not using it.  7. Vitamin D deficiency Diagnosis likely given obesity.  She is currently taking no vitamin D supplement. She endorses fatigue.  8. Depression screening Jane [name] was screened for depression as part of her new patient workup.  PHQ-9 is 19.  Assessment/Plan:   1. Other fatigue Jane [name] does feel that her weight is causing her energy to be lower than it should be. Fatigue may be related to obesity, depression or many other causes. Labs will be ordered, and in the meanwhile, Jane [name] will focus on self care including making healthy food choices, increasing physical activity and focusing on stress reduction. - EKG 12-Lead - Comprehensive metabolic panel - CBC with Differential/Platelet - Lipid Panel With LDL/HDL Ratio - T3 - T4 - TSH - VITAMIN D 25 Hydroxy (Vit-D Deficiency, Fractures)  2. SOB (shortness of breath) on exertion Jane [name] does feel that she gets out of breath more easily that she used to when she exercises. Jane [name]'s shortness of breath appears to be obesity related and exercise induced. She has agreed to work on weight loss and gradually increase exercise to treat her exercise induced shortness of breath. Will continue to monitor closely. - Comprehensive metabolic panel - CBC with Differential/Platelet - Lipid Panel With LDL/HDL Ratio - T3 - T4 - TSH - VITAMIN D 25 Hydroxy (Vit-D Deficiency, Fractures)  3. Type 2 diabetes mellitus with hyperglycemia, without long-term current use of insulin (HCC) Good blood sugar control is important to decrease the likelihood of diabetic complications such as nephropathy, neuropathy, limb  loss, blindness, coronary artery disease, and death. Intensive lifestyle modification including diet, exercise and weight loss are the first line of treatment for diabetes.  - Hemoglobin [name] - Insulin, random  4. Stage 3a chronic kidney disease Lab results and trends reviewed. We discussed several lifestyle modifications today and she will continue to work on diet, exercise and weight loss efforts. Avoid nephrotoxic medications. Orders and follow up as documented in patient record.   Counseling . Chronic kidney disease (CKD) happens when the kidneys are damaged over a long period of time. . Most of the time, this condition does not go away, but it can usually be controlled. Steps must be taken to slow down the kidney damage or to stop it from getting worse. . Intensive lifestyle modifications are the first line treatment for this issue.  Marland Kitchen Avoid buying foods that are: processed, frozen, or prepackaged to avoid excess salt. - Comprehensive metabolic panel  5. History of DVT (deep vein thrombosis) [place] will let the [facility] know she will need close follow-up given changes  in eating.  6. OSA (obstructive sleep apnea) Follow-up at next appointment.  7. Vitamin D deficiency Low Vitamin D level contributes to fatigue and are associated with obesity, breast, and colon cancer. - VITAMIN D 25 Hydroxy (Vit-D Deficiency, Fractures)  8. Depression screening Christian had a positive depression screening. Depression is commonly associated with obesity and often results in emotional eating behaviors. We will monitor this closely and work on CBT to help improve the non-hunger eating patterns. Referral to Psychology may be required if no improvement is seen as she continues in our clinic.  9. Class 3 severe obesity with serious comorbidity and body mass index (BMI) of 40.0 to 44.9 in adult, unspecified obesity type (HCC) Zylie is currently in the action stage of change and her goal is to continue with  weight loss efforts. I recommend Kiyra begin the structured treatment plan as follows:  She has agreed to the Category 4 Plan.  Exercise goals: No exercise has been prescribed at this time.   Behavioral modification strategies: increasing lean protein intake, meal planning and cooking strategies, keeping healthy foods in the home and planning for success.  She was informed of the importance of frequent follow-up visits to maximize her success with intensive lifestyle modifications for her multiple health conditions. She was informed we would discuss her lab results at her next visit unless there is a critical issue that needs to be addressed sooner. Juwana agreed to keep her next visit at the agreed upon time to discuss these results.  Objective:   Blood pressure 119/70, pulse 67, temperature 98.2 F (36.8 C), temperature source Oral, height _0  (1.727 m), weight 283 lb (128.4 kg), SpO2 96 %. Body mass index is 43.03 kg/m.  EKG: Normal sinus rhythm, rate 67 bpm.  Indirect Calorimeter completed today shows a VO2 of 379 and a REE of 2635.  Her calculated basal metabolic rate is 4235 thus her basal metabolic rate is better than expected.  General: Cooperative, alert, well developed, in no acute distress. HEENT: Conjunctivae and lids unremarkable. Cardiovascular: Regular rhythm.  Lungs: Normal work of breathing. Neurologic: No focal deficits.   Lab Results  Component Value Date   CREATININE 1.16 (H) 02/16/2020   BUN 31 (H) 02/16/2020   NA 142 02/16/2020   K 4.3 02/16/2020   CL 103 02/16/2020   CO2 25 02/16/2020   Lab Results  Component Value Date   ALT 23 02/16/2020   AST 20 02/16/2020   ALKPHOS 122 (H) 02/16/2020   BILITOT 0.4 02/16/2020   Lab Results  Component Value Date   HGBA1C 6.3 (H) 10/25/2019   HGBA1C 6.3 (H) 03/26/2019   HGBA1C 7.5 11/18/2018   Lab Results  Component Value Date   TSH 11.100 (H) 01/20/2020   Lab Results  Component Value Date   CHOL 210 (H)  10/25/2019   HDL 42 10/25/2019   LDLCALC 127 (H) 10/25/2019   TRIG 234 (H) 10/25/2019   CHOLHDL 5.0 (H) 10/25/2019   Lab Results  Component Value Date   WBC 7.7 01/05/2020   HGB 14.2 01/05/2020   HCT 43.5 01/05/2020   MCV 92.6 01/05/2020   PLT 158 01/05/2020   Lab Results  Component Value Date   IRON 77 01/04/2020   TIBC 339 01/04/2020   FERRITIN 96 01/04/2020   Attestation Statements:   This is the patient's first visit at [facility]. The patient's NEW PATIENT PACKET was reviewed at length. Included in the packet: current and past health  history, medications, allergies, ROS, gynecologic history (women only), surgical history, family history, social history, weight history, weight loss surgery history (for those that have had weight loss surgery), nutritional evaluation, mood and food questionnaire, PHQ9, Epworth questionnaire, sleep habits questionnaire, patient life and health improvement goals questionnaire. These will all be scanned into the patient's chart under media.   During the visit, I independently reviewed the patient's EKG, bioimpedance scale results, and indirect calorimeter results. I used this information to tailor a meal plan for the patient that will help her to lose weight and will improve her obesity-related conditions going forward. I performed a medically necessary appropriate examination and/or evaluation. I discussed the assessment and treatment plan with the patient. The patient was provided an opportunity to ask questions and all were answered. The patient agreed with the plan and demonstrated an understanding of the instructions. Labs were ordered at this visit and will be reviewed at the next visit unless more critical results need to be addressed immediately. Clinical information was updated and documented in the EMR.   Time spent on visit including pre-visit chart review and post-visit care was 60 minutes.   A separate 15 minutes was spent on  risk counseling (see above).    Time spent on visit including pre-visit chart review and post-visit charting and care was 60 minutes.   I, Water quality scientist, CMA, am acting as transcriptionist for Coralie Common, MD.  I have reviewed the above documentation for accuracy and completeness, and I agree with the above. - Jinny Blossom, MD

## 2020-03-30 DIAGNOSIS — M2550 Pain in unspecified joint: Secondary | ICD-10-CM | POA: Diagnosis not present

## 2020-03-30 DIAGNOSIS — R808 Other proteinuria: Secondary | ICD-10-CM | POA: Diagnosis not present

## 2020-03-30 DIAGNOSIS — M791 Myalgia, unspecified site: Secondary | ICD-10-CM | POA: Diagnosis not present

## 2020-03-30 DIAGNOSIS — E039 Hypothyroidism, unspecified: Secondary | ICD-10-CM | POA: Diagnosis not present

## 2020-03-30 LAB — MICROALBUMIN / CREATININE URINE RATIO
Creatinine, Urine: 85.2 mg/dL
Microalb/Creat Ratio: 42 mg/g creat — ABNORMAL HIGH (ref 0–29)
Microalbumin, Urine: 35.9 ug/mL

## 2020-03-30 LAB — COMPREHENSIVE METABOLIC PANEL
ALT: 20 IU/L (ref 0–32)
AST: 16 IU/L (ref 0–40)
Albumin/Globulin Ratio: 1.5 (ref 1.2–2.2)
Albumin: 4.1 g/dL (ref 3.8–4.9)
Alkaline Phosphatase: 114 IU/L (ref 48–121)
BUN/Creatinine Ratio: 18 (ref 9–23)
BUN: 24 mg/dL (ref 6–24)
Bilirubin Total: 0.3 mg/dL (ref 0.0–1.2)
CO2: 24 mmol/L (ref 20–29)
Calcium: 9.2 mg/dL (ref 8.7–10.2)
Chloride: 104 mmol/L (ref 96–106)
Creatinine, Ser: 1.33 mg/dL — ABNORMAL HIGH (ref 0.57–1.00)
GFR calc Af Amer: 53 mL/min/{1.73_m2} — ABNORMAL LOW (ref >59)
GFR calc non Af Amer: 46 mL/min/{1.73_m2} — ABNORMAL LOW (ref >59)
Globulin, Total: 2.8 g/dL (ref 1.5–4.5)
Glucose: 107 mg/dL — ABNORMAL HIGH (ref 65–99)
Potassium: 4.8 mmol/L (ref 3.5–5.2)
Sodium: 142 mmol/L (ref 134–144)
Total Protein: 6.9 g/dL (ref 6.0–8.5)

## 2020-03-30 LAB — CBC WITH DIFFERENTIAL/PLATELET
Basophils Absolute: 0 10*3/uL (ref 0.0–0.2)
Basos: 0 %
EOS (ABSOLUTE): 0.2 10*3/uL (ref 0.0–0.4)
Eos: 2 %
Hematocrit: 39.6 % (ref 34.0–46.6)
Hemoglobin: 13.2 g/dL (ref 11.1–15.9)
Immature Grans (Abs): 0 10*3/uL (ref 0.0–0.1)
Immature Granulocytes: 0 %
Lymphocytes Absolute: 1.4 10*3/uL (ref 0.7–3.1)
Lymphs: 19 %
MCH: 31.4 pg (ref 26.6–33.0)
MCHC: 33.3 g/dL (ref 31.5–35.7)
MCV: 94 fL (ref 79–97)
Monocytes Absolute: 0.6 10*3/uL (ref 0.1–0.9)
Monocytes: 8 %
Neutrophils Absolute: 5.1 10*3/uL (ref 1.4–7.0)
Neutrophils: 71 %
Platelets: 142 10*3/uL — ABNORMAL LOW (ref 150–450)
RBC: 4.21 x10E6/uL (ref 3.77–5.28)
RDW: 12.6 % (ref 11.7–15.4)
WBC: 7.3 10*3/uL (ref 3.4–10.8)

## 2020-03-30 LAB — VITAMIN D 25 HYDROXY (VIT D DEFICIENCY, FRACTURES): Vit D, 25-Hydroxy: 38 ng/mL (ref 30.0–100.0)

## 2020-03-30 LAB — LIPID PANEL WITH LDL/HDL RATIO
Cholesterol, Total: 203 mg/dL — ABNORMAL HIGH (ref 100–199)
HDL: 37 mg/dL — ABNORMAL LOW (ref >39)
LDL Chol Calc (NIH): 114 mg/dL — ABNORMAL HIGH (ref 0–99)
LDL/HDL Ratio: 3.1 ratio (ref 0.0–3.2)
Triglycerides: 296 mg/dL — ABNORMAL HIGH (ref 0–149)
VLDL Cholesterol Cal: 52 mg/dL — ABNORMAL HIGH (ref 5–40)

## 2020-03-30 LAB — TSH: TSH: 0.275 u[IU]/mL — ABNORMAL LOW (ref 0.450–4.500)

## 2020-03-30 LAB — T3: T3, Total: 125 ng/dL (ref 71–180)

## 2020-03-30 LAB — HEMOGLOBIN A1C
Est. average glucose Bld gHb Est-mCnc: 131 mg/dL
Hgb A1c MFr Bld: 6.2 % — ABNORMAL HIGH (ref 4.8–5.6)

## 2020-03-30 LAB — INSULIN, RANDOM: INSULIN: 21.5 u[IU]/mL (ref 2.6–24.9)

## 2020-03-30 LAB — T4: T4, Total: 8.4 ug/dL (ref 4.5–12.0)

## 2020-03-31 ENCOUNTER — Other Ambulatory Visit: Payer: Self-pay | Admitting: Family Medicine

## 2020-03-31 DIAGNOSIS — E039 Hypothyroidism, unspecified: Secondary | ICD-10-CM | POA: Diagnosis not present

## 2020-03-31 MED ORDER — WARFARIN SODIUM 5 MG PO TABS: 5.0000 mg | ORAL_TABLET | ORAL | 1 refills | Status: DC

## 2020-03-31 NOTE — Telephone Encounter (Signed)
Requested medication (s) are due for refill today- yes  Requested medication (s) are on the active medication list -yes   Future visit scheduled -no  Last refill: 08/18/19 6 RF  Notes to clinic: Patient request RF of non delegated Rx- outside provider  Requested Prescriptions  Pending Prescriptions Disp Refills   warfarin (COUMADIN) 5 MG tablet 30 tablet 6    Sig: Take 1 tablet (5 mg total) by mouth daily at 6 PM.      Hematology:  Anticoagulants - warfarin Failed - 03/31/2020 10:58 AM      Failed - This refill cannot be delegated      Failed - If the patient is managed by Coumadin Clinic - route to their Pool. If not, forward to the provider.      Failed - INR in normal range and within 30 days    INR  Date Value Ref Range Status  03/28/2020 3.9 (A) 2.0 - 3.0 Final  10/25/2019 2.6 (H) 0.9 - 1.2 Final    Comment:    Reference interval is for non-anticoagulated patients. Suggested INR therapeutic range for Vitamin K antagonist therapy:    Standard Dose (moderate intensity                   therapeutic range):       2.0 - 3.0    Higher intensity therapeutic range       2.5 - 3.5           Passed - Valid encounter within last 3 months    Recent Outpatient Visits           2 weeks ago Displacement of lumbar intervertebral disc without myelopathy   [facility] [place], [place], PA-C   3 weeks ago Type 2 diabetes mellitus with stage 3a chronic kidney disease, without long-term current use of insulin ([place])   [facility], Dionne Bucy, MD   1 month ago Acquired hypothyroidism   [name] [place], Dionne Bucy, MD   2 months ago Encounter for annual physical exam   [facility], Dionne Bucy, MD   2 months ago Calf swelling   [facility] [place], Dionne Bucy, MD       Future Appointments             In 2 weeks Derek Jack, MD [facility]   In 3 weeks  Bacigalupo, Dionne Bucy, MD [facility]                Requested Prescriptions  Pending Prescriptions Disp Refills   warfarin (COUMADIN) 5 MG tablet 30 tablet 6    Sig: Take 1 tablet (5 mg total) by mouth daily at 6 PM.      Hematology:  Anticoagulants - warfarin Failed - 03/31/2020 10:58 AM      Failed - This refill cannot be delegated      Failed - If the patient is managed by Coumadin Clinic - route to their Pool. If not, forward to the provider.      Failed - INR in normal range and within 30 days    INR  Date Value Ref Range Status  03/28/2020 3.9 (A) 2.0 - 3.0 Final  10/25/2019 2.6 (H) 0.9 - 1.2 Final    Comment:    Reference interval is for non-anticoagulated patients. Suggested INR therapeutic range for Vitamin K antagonist therapy:    Standard Dose (moderate intensity  therapeutic range):       2.0 - 3.0    Higher intensity therapeutic range       2.5 - 3.5           Passed - Valid encounter within last 3 months    Recent Outpatient Visits           2 weeks ago Displacement of lumbar intervertebral disc without myelopathy   [facility] [place], [place], PA-C   3 weeks ago Type 2 diabetes mellitus with stage 3a chronic kidney disease, without long-term current use of insulin [facility])   [facility] [place], Dionne Bucy, MD   1 month ago Acquired hypothyroidism   [facility] Bacigal[place], Dionne Bucy, MD   2 months ago Encounter for annual physical exam   [facility] [place], Dionne Bucy, MD   2 months ago Calf swelling   [facility] [place], Dionne Bucy, MD       Future Appointments             In 2 weeks Derek Jack, MD [facility]   In 3 weeks Bacigalupo, Dionne Bucy, MD [facility] [place]

## 2020-03-31 NOTE — Telephone Encounter (Signed)
Medication Refill - Medication: warfarin (COUMADIN) 5 MG tablet    Preferred Pharmacy (with phone number or street name):  [facility] [place] Phone:  [phone]  Fax:  [phone]       Agent: Please be advised that RX refills may take up to 3 business days. We ask that you follow-up with your pharmacy.

## 2020-04-01 DIAGNOSIS — E039 Hypothyroidism, unspecified: Secondary | ICD-10-CM | POA: Diagnosis not present

## 2020-04-02 DIAGNOSIS — E039 Hypothyroidism, unspecified: Secondary | ICD-10-CM | POA: Diagnosis not present

## 2020-04-04 ENCOUNTER — Other Ambulatory Visit: Payer: Self-pay | Admitting: Internal Medicine

## 2020-04-04 DIAGNOSIS — M791 Myalgia, unspecified site: Secondary | ICD-10-CM

## 2020-04-04 DIAGNOSIS — E039 Hypothyroidism, unspecified: Secondary | ICD-10-CM | POA: Diagnosis not present

## 2020-04-05 DIAGNOSIS — E039 Hypothyroidism, unspecified: Secondary | ICD-10-CM | POA: Diagnosis not present

## 2020-04-06 ENCOUNTER — Telehealth: Payer: Self-pay | Admitting: Internal Medicine

## 2020-04-06 DIAGNOSIS — E039 Hypothyroidism, unspecified: Secondary | ICD-10-CM | POA: Diagnosis not present

## 2020-04-07 DIAGNOSIS — E039 Hypothyroidism, unspecified: Secondary | ICD-10-CM | POA: Diagnosis not present

## 2020-04-08 ENCOUNTER — Other Ambulatory Visit: Payer: Self-pay | Admitting: Family Medicine

## 2020-04-08 DIAGNOSIS — E039 Hypothyroidism, unspecified: Secondary | ICD-10-CM | POA: Diagnosis not present

## 2020-04-08 NOTE — Telephone Encounter (Signed)
Requested Prescriptions  Pending Prescriptions Disp Refills  . lisinopril (ZESTRIL) 20 MG tablet [Pharmacy Med Name: lisinopril 20 mg tablet] 90 tablet 0    Sig: TAKE 1 TABLET BY MOUTH DAILY     Cardiovascular:  ACE Inhibitors Failed - 04/08/2020  8:01 AM      Failed - Cr in normal range and within 180 days    Creatinine, Ser  Date Value Ref Range Status  03/29/2020 1.33 (H) 0.57 - 1.00 mg/dL Final         Passed - K in normal range and within 180 days    Potassium  Date Value Ref Range Status  03/29/2020 4.8 3.5 - 5.2 mmol/L Final         Passed - Patient is not pregnant      Passed - Last BP in normal range    BP Readings from Last 1 Encounters:  03/29/20 119/70         Passed - Valid encounter within last 6 months    Recent Outpatient Visits          3 weeks ago Displacement of lumbar intervertebral disc without myelopathy   [facility] [place], Clearnce Sorrel, PA-C   1 month ago Type 2 diabetes mellitus with stage 3a chronic kidney disease, without long-term current use of insulin ([place])   [facility] [place], Dionne Bucy, MD   1 month ago Acquired hypothyroidism   [facility] [place], Dionne Bucy, MD   2 months ago Encounter for annual physical exam   [facility] [address], Dionne Bucy, MD   3 months ago Calf swelling   [facility] [place], Dionne Bucy, MD      Future Appointments            In 2 weeks Bacigalupo, Dionne Bucy, MD [facility] [place]

## 2020-04-09 DIAGNOSIS — E039 Hypothyroidism, unspecified: Secondary | ICD-10-CM | POA: Diagnosis not present

## 2020-04-10 ENCOUNTER — Ambulatory Visit: Payer: Medicaid Other

## 2020-04-10 ENCOUNTER — Telehealth: Payer: Self-pay

## 2020-04-10 DIAGNOSIS — Z1231 Encounter for screening mammogram for malignant neoplasm of breast: Secondary | ICD-10-CM | POA: Diagnosis not present

## 2020-04-10 DIAGNOSIS — E039 Hypothyroidism, unspecified: Secondary | ICD-10-CM | POA: Diagnosis not present

## 2020-04-10 NOTE — Telephone Encounter (Signed)
Copied from [place] [id number]. Topic: General - Inquiry >> Apr 10, 2020  3:23 PM Percell Belt A wrote: Reason for CRM: Jane [name] called and stated that Jane [name] called her, office was on the other line  Best number -[phone]

## 2020-04-11 ENCOUNTER — Encounter: Payer: Self-pay | Admitting: Orthopedic Surgery

## 2020-04-11 ENCOUNTER — Other Ambulatory Visit: Payer: Self-pay

## 2020-04-11 ENCOUNTER — Ambulatory Visit (INDEPENDENT_AMBULATORY_CARE_PROVIDER_SITE_OTHER): Payer: Medicaid Other

## 2020-04-11 ENCOUNTER — Ambulatory Visit: Payer: Medicaid Other | Admitting: Orthopedic Surgery

## 2020-04-11 ENCOUNTER — Ambulatory Visit: Payer: Medicaid Other

## 2020-04-11 DIAGNOSIS — Z86718 Personal history of other venous thrombosis and embolism: Secondary | ICD-10-CM | POA: Diagnosis not present

## 2020-04-11 DIAGNOSIS — G8929 Other chronic pain: Secondary | ICD-10-CM

## 2020-04-11 DIAGNOSIS — M1712 Unilateral primary osteoarthritis, left knee: Secondary | ICD-10-CM

## 2020-04-11 DIAGNOSIS — M7052 Other bursitis of knee, left knee: Secondary | ICD-10-CM | POA: Diagnosis not present

## 2020-04-11 DIAGNOSIS — E039 Hypothyroidism, unspecified: Secondary | ICD-10-CM | POA: Diagnosis not present

## 2020-04-11 DIAGNOSIS — M25562 Pain in left knee: Secondary | ICD-10-CM

## 2020-04-11 DIAGNOSIS — Z6841 Body Mass Index (BMI) 40.0 and over, adult: Secondary | ICD-10-CM

## 2020-04-11 LAB — HM MAMMOGRAPHY

## 2020-04-11 LAB — POCT INR
INR: 3.5 — AB (ref 2.0–3.0)
PT: 41.4

## 2020-04-11 NOTE — Progress Notes (Signed)
BP 131/64   Pulse 63   Ht 5\' 8"  (1.727 m)   Wt 283 lb (128.4 kg)   LMP  (LMP Unknown)   BMI 43.03 kg/m   Encounter Diagnoses  Name Primary?  . Morbid obesity ([place]) Yes  . Body mass index 40.0-44.9, adult ([place])   . Pes anserinus bursitis of left knee   . Chronic pain of left knee     . Chief Complaint  Patient presents with  . Knee Pain    left/ wants injeciton / asking for knee brace     Left knee instability in the ap plane   Did not fit off the shelf brace   Try custom brace    Procedure note Left knee injection for bursitis   verbal consent was obtained to inject Left knee PES BURSA  Timeout was completed to confirm the site of injection  The medications used were 40 mg of Depo-Medrol and 1% lidocaine 3 cc  Anesthesia was provided by ethyl chloride and the skin was prepped with alcohol.  After cleaning the skin with alcohol a 25-gauge needle was used to inject the left knee bursa   There were no complications and a sterile bandage was applied   Procedure note left knee injection   verbal consent was obtained to inject left knee joint  Timeout was completed to confirm the site of injection  The medications used were 40 mg of Depo-Medrol and 1% lidocaine 3 cc  Anesthesia was provided by ethyl chloride and the skin was prepped with alcohol.  After cleaning the skin with alcohol a 20-gauge needle was used to inject the left knee joint. There were no complications. A sterile bandage was applied.    Fu prn

## 2020-04-11 NOTE — Addendum Note (Signed)
Addended byCandice Camp on: 04/11/2020 04:41 PM   Modules accepted: Orders

## 2020-04-11 NOTE — Patient Instructions (Addendum)

## 2020-04-11 NOTE — Patient Instructions (Signed)
Description   Current dose: 5 mg daily Today changes: Take 5mg  daily, except 2.5mg  on Tuesdays Return in 2 weeks.

## 2020-04-12 ENCOUNTER — Ambulatory Visit (INDEPENDENT_AMBULATORY_CARE_PROVIDER_SITE_OTHER): Payer: Medicaid Other | Admitting: Family Medicine

## 2020-04-12 ENCOUNTER — Other Ambulatory Visit (HOSPITAL_COMMUNITY): Payer: Medicaid Other

## 2020-04-12 ENCOUNTER — Encounter (INDEPENDENT_AMBULATORY_CARE_PROVIDER_SITE_OTHER): Payer: Self-pay | Admitting: Family Medicine

## 2020-04-12 VITALS — BP 106/62 | HR 60 | Temp 97.8°F | Ht 68.0 in | Wt 275.0 lb

## 2020-04-12 DIAGNOSIS — E039 Hypothyroidism, unspecified: Secondary | ICD-10-CM | POA: Diagnosis not present

## 2020-04-12 DIAGNOSIS — D225 Melanocytic nevi of trunk: Secondary | ICD-10-CM | POA: Diagnosis not present

## 2020-04-12 DIAGNOSIS — Z6841 Body Mass Index (BMI) 40.0 and over, adult: Secondary | ICD-10-CM

## 2020-04-12 DIAGNOSIS — E559 Vitamin D deficiency, unspecified: Secondary | ICD-10-CM | POA: Diagnosis not present

## 2020-04-12 DIAGNOSIS — Z872 Personal history of diseases of the skin and subcutaneous tissue: Secondary | ICD-10-CM | POA: Diagnosis not present

## 2020-04-12 DIAGNOSIS — E1165 Type 2 diabetes mellitus with hyperglycemia: Secondary | ICD-10-CM

## 2020-04-12 DIAGNOSIS — L570 Actinic keratosis: Secondary | ICD-10-CM | POA: Diagnosis not present

## 2020-04-12 DIAGNOSIS — E7849 Other hyperlipidemia: Secondary | ICD-10-CM

## 2020-04-12 DIAGNOSIS — L905 Scar conditions and fibrosis of skin: Secondary | ICD-10-CM | POA: Diagnosis not present

## 2020-04-12 DIAGNOSIS — D485 Neoplasm of uncertain behavior of skin: Secondary | ICD-10-CM | POA: Diagnosis not present

## 2020-04-12 DIAGNOSIS — E038 Other specified hypothyroidism: Secondary | ICD-10-CM | POA: Diagnosis not present

## 2020-04-12 MED ORDER — VITAMIN D (ERGOCALCIFEROL) 1.25 MG (50000 UNIT) PO CAPS: 50000.0000 [IU] | ORAL_CAPSULE | ORAL | 0 refills | Status: DC

## 2020-04-12 MED ORDER — LEVOTHYROXINE SODIUM 175 MCG PO TABS: 175.0000 ug | ORAL_TABLET | ORAL | Status: DC

## 2020-04-12 MED ORDER — LEVOTHYROXINE SODIUM 150 MCG PO TABS: 150.0000 ug | ORAL_TABLET | ORAL | 0 refills | Status: DC

## 2020-04-13 ENCOUNTER — Other Ambulatory Visit (HOSPITAL_COMMUNITY): Payer: Self-pay | Admitting: Internal Medicine

## 2020-04-13 ENCOUNTER — Other Ambulatory Visit: Payer: Self-pay

## 2020-04-13 ENCOUNTER — Other Ambulatory Visit: Payer: Self-pay | Admitting: Internal Medicine

## 2020-04-13 ENCOUNTER — Inpatient Hospital Stay (HOSPITAL_COMMUNITY): Payer: Medicaid Other | Attending: Hematology

## 2020-04-13 DIAGNOSIS — Z79899 Other long term (current) drug therapy: Secondary | ICD-10-CM | POA: Diagnosis not present

## 2020-04-13 DIAGNOSIS — G2581 Restless legs syndrome: Secondary | ICD-10-CM | POA: Diagnosis not present

## 2020-04-13 DIAGNOSIS — D509 Iron deficiency anemia, unspecified: Secondary | ICD-10-CM | POA: Insufficient documentation

## 2020-04-13 DIAGNOSIS — N183 Chronic kidney disease, stage 3 unspecified: Secondary | ICD-10-CM | POA: Insufficient documentation

## 2020-04-13 DIAGNOSIS — Z86718 Personal history of other venous thrombosis and embolism: Secondary | ICD-10-CM | POA: Diagnosis not present

## 2020-04-13 DIAGNOSIS — E119 Type 2 diabetes mellitus without complications: Secondary | ICD-10-CM | POA: Diagnosis not present

## 2020-04-13 DIAGNOSIS — D631 Anemia in chronic kidney disease: Secondary | ICD-10-CM | POA: Insufficient documentation

## 2020-04-13 DIAGNOSIS — K219 Gastro-esophageal reflux disease without esophagitis: Secondary | ICD-10-CM | POA: Diagnosis not present

## 2020-04-13 DIAGNOSIS — E7800 Pure hypercholesterolemia, unspecified: Secondary | ICD-10-CM | POA: Insufficient documentation

## 2020-04-13 DIAGNOSIS — Z7982 Long term (current) use of aspirin: Secondary | ICD-10-CM | POA: Insufficient documentation

## 2020-04-13 DIAGNOSIS — F429 Obsessive-compulsive disorder, unspecified: Secondary | ICD-10-CM | POA: Diagnosis not present

## 2020-04-13 DIAGNOSIS — Z7901 Long term (current) use of anticoagulants: Secondary | ICD-10-CM | POA: Diagnosis not present

## 2020-04-13 DIAGNOSIS — G4730 Sleep apnea, unspecified: Secondary | ICD-10-CM | POA: Insufficient documentation

## 2020-04-13 DIAGNOSIS — E039 Hypothyroidism, unspecified: Secondary | ICD-10-CM | POA: Insufficient documentation

## 2020-04-13 DIAGNOSIS — F419 Anxiety disorder, unspecified: Secondary | ICD-10-CM | POA: Insufficient documentation

## 2020-04-13 DIAGNOSIS — F319 Bipolar disorder, unspecified: Secondary | ICD-10-CM | POA: Insufficient documentation

## 2020-04-13 DIAGNOSIS — I87022 Postthrombotic syndrome with inflammation of left lower extremity: Secondary | ICD-10-CM

## 2020-04-13 DIAGNOSIS — D649 Anemia, unspecified: Secondary | ICD-10-CM

## 2020-04-13 LAB — CBC WITH DIFFERENTIAL/PLATELET
Abs Immature Granulocytes: 0.01 10*3/uL (ref 0.00–0.07)
Basophils Absolute: 0 10*3/uL (ref 0.0–0.1)
Basophils Relative: 0 %
Eosinophils Absolute: 0 10*3/uL (ref 0.0–0.5)
Eosinophils Relative: 1 %
HCT: 40.8 % (ref 36.0–46.0)
Hemoglobin: 13.1 g/dL (ref 12.0–15.0)
Immature Granulocytes: 0 %
Lymphocytes Relative: 22 %
Lymphs Abs: 1.1 10*3/uL (ref 0.7–4.0)
MCH: 31 pg (ref 26.0–34.0)
MCHC: 32.1 g/dL (ref 30.0–36.0)
MCV: 96.5 fL (ref 80.0–100.0)
Monocytes Absolute: 0.6 10*3/uL (ref 0.1–1.0)
Monocytes Relative: 12 %
Neutro Abs: 3.1 10*3/uL (ref 1.7–7.7)
Neutrophils Relative %: 65 %
Platelets: 149 10*3/uL — ABNORMAL LOW (ref 150–400)
RBC: 4.23 MIL/uL (ref 3.87–5.11)
RDW: 13.1 % (ref 11.5–15.5)
WBC: 4.8 10*3/uL (ref 4.0–10.5)
nRBC: 0 % (ref 0.0–0.2)

## 2020-04-13 LAB — COMPREHENSIVE METABOLIC PANEL
ALT: 17 U/L (ref 0–44)
AST: 13 U/L — ABNORMAL LOW (ref 15–41)
Albumin: 4.3 g/dL (ref 3.5–5.0)
Alkaline Phosphatase: 86 U/L (ref 38–126)
Anion gap: 12 (ref 5–15)
BUN: 44 mg/dL — ABNORMAL HIGH (ref 6–20)
CO2: 23 mmol/L (ref 22–32)
Calcium: 9.7 mg/dL (ref 8.9–10.3)
Chloride: 107 mmol/L (ref 98–111)
Creatinine, Ser: 1.24 mg/dL — ABNORMAL HIGH (ref 0.44–1.00)
GFR calc Af Amer: 57 mL/min — ABNORMAL LOW (ref >60)
GFR calc non Af Amer: 50 mL/min — ABNORMAL LOW (ref >60)
Glucose, Bld: 118 mg/dL — ABNORMAL HIGH (ref 70–99)
Potassium: 4.3 mmol/L (ref 3.5–5.1)
Sodium: 142 mmol/L (ref 135–145)
Total Bilirubin: 0.3 mg/dL (ref 0.3–1.2)
Total Protein: 7.6 g/dL (ref 6.5–8.1)

## 2020-04-13 LAB — VITAMIN B12: Vitamin B-12: 476 pg/mL (ref 180–914)

## 2020-04-13 LAB — FOLATE: Folate: 34.6 ng/mL (ref >5.9)

## 2020-04-13 LAB — FERRITIN: Ferritin: 40 ng/mL (ref 11–307)

## 2020-04-13 LAB — IRON AND TIBC
Iron: 97 ug/dL (ref 28–170)
Saturation Ratios: 28 % (ref 10.4–31.8)
TIBC: 353 ug/dL (ref 250–450)
UIBC: 256 ug/dL

## 2020-04-13 NOTE — Progress Notes (Signed)
Chief Complaint:   OBESITY Jane [name] is here to discuss her progress with her obesity treatment plan along with follow-up of her obesity related diagnoses. Jane [name] is on the Category 4 Plan and states she is following her eating plan approximately 80% of the time. Jane [name] states she is exercising for 0 minutes 0 times per week.  Today's visit was #: 2 Starting weight: 283 lbs Starting date: 03/29/2020 Today's weight: 275 lbs Today's date: 04/12/2020 Total lbs lost to date: 8 lbs Total lbs lost since last in-office visit: 8 lbs  Interim History: Jane [name] says that the meal plan is easy to follow if she is at home, but on Memorial Day, she struggled to follow the plan due to being at her daughter's for a BBQ.  She feels that this is more than enough food and at times says she cannot finish it.  She reports that she is probably getting in around 8 ounces of meat at dinner.  She is having rice cakes for snacks and Yasso bars.  Subjective:   1. Other specified hypothyroidism Taheerah is taking levothyroxine 175 mcg daily and is experiencing symptoms of underactive thyroid.   Lab Results  Component Value Date   TSH 0.275 (L) 03/29/2020   2. Type 2 diabetes mellitus with hyperglycemia, without long-term current use of insulin (HCC) Jane [name] is not on metformin.  She has occasional carb cravings.  Lab Results  Component Value Date   HGBA1C 6.2 (H) 03/29/2020   HGBA1C 6.3 (H) 10/25/2019   HGBA1C 6.3 (H) 03/26/2019   Lab Results  Component Value Date   LDLCALC 114 (H) 03/29/2020   CREATININE 1.33 (H) 03/29/2020   Lab Results  Component Value Date   INSULIN 21.5 03/29/2020   3. Other hyperlipidemia Jane [name] has hyperlipidemia and has been trying to improve her cholesterol levels with intensive lifestyle modification including a low saturated fat diet, exercise and weight loss. She denies any chest pain, claudication or myalgias.  She is on Lipitor 20 mg daily.  Lab Results  Component Value Date     ALT 20 03/29/2020   AST 16 03/29/2020   ALKPHOS 114 03/29/2020   BILITOT 0.3 03/29/2020   Lab Results  Component Value Date   CHOL 203 (H) 03/29/2020   HDL 37 (L) 03/29/2020   LDLCALC 114 (H) 03/29/2020   TRIG 296 (H) 03/29/2020   CHOLHDL 5.0 (H) 10/25/2019   4. Vitamin D deficiency [name]'s Vitamin D level was 38.0 on 03/29/2020. She is currently taking OTC vitamin D. She denies nausea, vomiting or muscle weakness.  She endorses fatigue.  Assessment/Plan:   1. Other specified hypothyroidism Will change medication to levothyroxine 175 mcg 4 days a week and 150 mcg 3 days per week. - levothyroxine (SYNTHROID) 150 MCG tablet; Take 1 tablet (150 mcg total) by mouth 3 (three) times a week.  Dispense: 30 tablet; Refill: 0 - levothyroxine (SYNTHROID) 175 MCG tablet; Take 1 tablet (175 mcg total) by mouth 4 (four) times a week.  2. Type 2 diabetes mellitus with hyperglycemia, without long-term current use of insulin (HCC) Good blood sugar control is important to decrease the likelihood of diabetic complications such as nephropathy, neuropathy, limb loss, blindness, coronary artery disease, and death. Intensive lifestyle modification including diet, exercise and weight loss are the first line of treatment for diabetes.  Continue Category 4.  She will let me know her former diabetes medication.  3. Other hyperlipidemia Cardiovascular risk and specific lipid/LDL goals reviewed.  We discussed  several lifestyle modifications today and [place] will continue to work on diet, exercise and weight loss efforts. Orders and follow up as documented in patient record.  Will follow-up FLP in 3 months and if LDL still elevated, will increase statin.  Counseling Intensive lifestyle modifications are the first line treatment for this issue. . Dietary changes: Increase soluble fiber. Decrease simple carbohydrates. . Exercise changes: Moderate to vigorous-intensity aerobic activity 150 minutes per week if  tolerated. . Lipid-lowering medications: see documented in medical record.  4. Vitamin D deficiency Low Vitamin D level contributes to fatigue and are associated with obesity, breast, and colon cancer. She agrees to start to take prescription Vitamin D @50 ,000 IU every week and will follow-up for routine testing of Vitamin D, at least 2-3 times per year to avoid over-replacement. - Vitamin D, Ergocalciferol, (DRISDOL) 1.25 MG (50000 UNIT) CAPS capsule; Take 1 capsule (50,000 Units total) by mouth every 7 (seven) days.  Dispense: 4 capsule; Refill: 0  5. Class 3 severe obesity with serious comorbidity and body mass index (BMI) of 40.0 to 44.9 in adult, unspecified obesity type (HCC) Jane [name] is currently in the action stage of change. As such, her goal is to continue with weight loss efforts. She has agreed to the Category 4 Plan.   Exercise goals: All adults should avoid inactivity. Some physical activity is better than none, and adults who participate in any amount of physical activity gain some health benefits.  Behavioral modification strategies: increasing lean protein intake, increasing vegetables, meal planning and cooking strategies and keeping healthy foods in the home.  Jane [name] has agreed to follow-up with our clinic in 2 weeks. She was informed of the importance of frequent follow-up visits to maximize her success with intensive lifestyle modifications for her multiple health conditions.   Objective:   Blood pressure 106/62, pulse 60, temperature 97.8 F (36.6 C), temperature source Oral, height 5\' 8"  (1.727 m), weight 275 lb (124.7 kg), SpO2 98 %. Body mass index is 41.81 kg/m.  General: Cooperative, alert, well developed, in no acute distress. HEENT: Conjunctivae and lids unremarkable. Cardiovascular: Regular rhythm.  Lungs: Normal work of breathing. Neurologic: No focal deficits.   Lab Results  Component Value Date   CREATININE 1.33 (H) 03/29/2020   BUN 24 03/29/2020   NA 142  03/29/2020   K 4.8 03/29/2020   CL 104 03/29/2020   CO2 24 03/29/2020   Lab Results  Component Value Date   ALT 20 03/29/2020   AST 16 03/29/2020   ALKPHOS 114 03/29/2020   BILITOT 0.3 03/29/2020   Lab Results  Component Value Date   HGBA1C 6.2 (H) 03/29/2020   HGBA1C 6.3 (H) 10/25/2019   HGBA1C 6.3 (H) 03/26/2019   HGBA1C 7.5 11/18/2018   Lab Results  Component Value Date   INSULIN 21.5 03/29/2020   Lab Results  Component Value Date   TSH 0.275 (L) 03/29/2020   Lab Results  Component Value Date   CHOL 203 (H) 03/29/2020   HDL 37 (L) 03/29/2020   LDLCALC 114 (H) 03/29/2020   TRIG 296 (H) 03/29/2020   CHOLHDL 5.0 (H) 10/25/2019   Lab Results  Component Value Date   WBC 7.3 03/29/2020   HGB 13.2 03/29/2020   HCT 39.6 03/29/2020   MCV 94 03/29/2020   PLT 142 (L) 03/29/2020   Lab Results  Component Value Date   IRON 77 01/04/2020   TIBC 339 01/04/2020   FERRITIN 96 01/04/2020   Attestation Statements:   Reviewed by  clinician on day of visit: allergies, medications, problem list, medical history, surgical history, family history, social history, and previous encounter notes.  I, Water quality scientist, CMA, am acting as transcriptionist for Coralie Common, MD.  I have reviewed the above documentation for accuracy and completeness, and I agree with the above. - Jinny Blossom, MD

## 2020-04-14 DIAGNOSIS — E039 Hypothyroidism, unspecified: Secondary | ICD-10-CM | POA: Diagnosis not present

## 2020-04-15 DIAGNOSIS — E039 Hypothyroidism, unspecified: Secondary | ICD-10-CM | POA: Diagnosis not present

## 2020-04-16 DIAGNOSIS — E039 Hypothyroidism, unspecified: Secondary | ICD-10-CM | POA: Diagnosis not present

## 2020-04-17 DIAGNOSIS — G4733 Obstructive sleep apnea (adult) (pediatric): Secondary | ICD-10-CM | POA: Diagnosis not present

## 2020-04-17 DIAGNOSIS — E039 Hypothyroidism, unspecified: Secondary | ICD-10-CM | POA: Diagnosis not present

## 2020-04-18 ENCOUNTER — Ambulatory Visit: Payer: Medicaid Other

## 2020-04-18 DIAGNOSIS — E039 Hypothyroidism, unspecified: Secondary | ICD-10-CM | POA: Diagnosis not present

## 2020-04-19 ENCOUNTER — Ambulatory Visit (HOSPITAL_COMMUNITY): Payer: Medicaid Other | Admitting: Hematology

## 2020-04-19 ENCOUNTER — Other Ambulatory Visit: Payer: Self-pay

## 2020-04-19 ENCOUNTER — Inpatient Hospital Stay (HOSPITAL_BASED_OUTPATIENT_CLINIC_OR_DEPARTMENT_OTHER): Payer: Medicaid Other | Admitting: Nurse Practitioner

## 2020-04-19 DIAGNOSIS — D509 Iron deficiency anemia, unspecified: Secondary | ICD-10-CM | POA: Diagnosis not present

## 2020-04-19 DIAGNOSIS — E119 Type 2 diabetes mellitus without complications: Secondary | ICD-10-CM | POA: Diagnosis not present

## 2020-04-19 DIAGNOSIS — D649 Anemia, unspecified: Secondary | ICD-10-CM | POA: Diagnosis not present

## 2020-04-19 DIAGNOSIS — G2581 Restless legs syndrome: Secondary | ICD-10-CM | POA: Diagnosis not present

## 2020-04-19 DIAGNOSIS — F429 Obsessive-compulsive disorder, unspecified: Secondary | ICD-10-CM | POA: Diagnosis not present

## 2020-04-19 DIAGNOSIS — Z86718 Personal history of other venous thrombosis and embolism: Secondary | ICD-10-CM | POA: Diagnosis not present

## 2020-04-19 DIAGNOSIS — D631 Anemia in chronic kidney disease: Secondary | ICD-10-CM | POA: Diagnosis not present

## 2020-04-19 DIAGNOSIS — K219 Gastro-esophageal reflux disease without esophagitis: Secondary | ICD-10-CM | POA: Diagnosis not present

## 2020-04-19 DIAGNOSIS — F319 Bipolar disorder, unspecified: Secondary | ICD-10-CM | POA: Diagnosis not present

## 2020-04-19 DIAGNOSIS — Z79899 Other long term (current) drug therapy: Secondary | ICD-10-CM | POA: Diagnosis not present

## 2020-04-19 DIAGNOSIS — G4730 Sleep apnea, unspecified: Secondary | ICD-10-CM | POA: Diagnosis not present

## 2020-04-19 DIAGNOSIS — F419 Anxiety disorder, unspecified: Secondary | ICD-10-CM | POA: Diagnosis not present

## 2020-04-19 DIAGNOSIS — Z7982 Long term (current) use of aspirin: Secondary | ICD-10-CM | POA: Diagnosis not present

## 2020-04-19 DIAGNOSIS — E7800 Pure hypercholesterolemia, unspecified: Secondary | ICD-10-CM | POA: Diagnosis not present

## 2020-04-19 DIAGNOSIS — E039 Hypothyroidism, unspecified: Secondary | ICD-10-CM | POA: Diagnosis not present

## 2020-04-19 DIAGNOSIS — Z7901 Long term (current) use of anticoagulants: Secondary | ICD-10-CM | POA: Diagnosis not present

## 2020-04-19 DIAGNOSIS — N183 Chronic kidney disease, stage 3 unspecified: Secondary | ICD-10-CM | POA: Diagnosis not present

## 2020-04-19 NOTE — Assessment & Plan Note (Signed)
1.  Normocytic anemia: -This is a combination anemia from CKD and iron deficiency. -Colonoscopy on 11/22/2019 showed 3 small polyps in the ascending colon and the cecum.  Nonbleeding internal hemorrhoids.  Pathology consistent with tubular adenoma. -She has taken iron pills in the past without much improvement. -She has a history of blood transfusions in the past. -Last Feraheme infusion was 10/14/2019 and 10/21/2019. -Labs done on 04/13/2020 showed hemoglobin 13.1, ferritin 40, percent saturation 28, creatinine 1.24 -Patient reports she is having extreme fatigue throughout the day.  She is very sleepy and sleeps a lot. -We will give her 2 iron infusions due to her fatigue. -We will see her back and 4 months with repeat labs  2.  Recurrent left leg DVT: -She had her first episode of left leg DVT in 2014. -Recurrent episode was while taking Xarelto. -She is currently on Coumadin.

## 2020-04-19 NOTE — Progress Notes (Signed)
[place], [place] [address]   CLINIC:  Medical Oncology/Hematology  PCP:  [name], MD [address] [place] [address] [phone]   REASON FOR VISIT: Follow-up for iron deficiency anemia   CURRENT THERAPY: Intermittent iron infusions   INTERVAL HISTORY:  [name] 54 y.o. female returns for routine follow-up for iron deficiency anemia.  Patient reports she is doing well since her last visit.  However she is having extreme fatigue throughout her day.  She is sleeping a lot more.  She denies any bright red bleeding per rectum or melena.  She denies any easy bruising or bleeding. Denies any nausea, vomiting, or diarrhea. Denies any new pains. Had not noticed any recent bleeding such as epistaxis, hematuria or hematochezia. Denies recent chest pain on exertion, shortness of breath on minimal exertion, pre-syncopal episodes, or palpitations. Denies any numbness or tingling in hands or feet. Denies any recent fevers, infections, or recent hospitalizations. Patient reports appetite at 75% and energy level at 50%.  She is eating well maintain her weight this time.     REVIEW OF SYSTEMS:  Review of Systems  Constitutional: Positive for fatigue.  Neurological: Positive for numbness.  All other systems reviewed and are negative.    PAST MEDICAL/SURGICAL HISTORY:  Past Medical History:  Diagnosis Date  . ADD (attention deficit disorder)   . Anxiety   . Back pain   . Bilateral swelling of feet   . Bipolar 1 disorder ([place])   . Bipolar 1 disorder ([place])   . Bipolar disorder ([place])   . Chewing difficulty   . Chronic fatigue syndrome   . Chronic kidney disease    Stage 3 kidney disease;dx by Dr. Sinda Du.   . Constipation   . Depression   . Diabetes mellitus without complication ([place])   . Dyspnea    with exertion  . GERD (gastroesophageal reflux disease)   . Headache    migraines  . High cholesterol     . History of blood clots   . History of DVT (deep vein thrombosis)    left leg  . Hypertension    states under control with meds., has been on med. x 2 years  . Hypothyroidism   . Joint pain   . Neuropathy   . Obsessive-compulsive disorder   . Peripheral vascular disease ([place])   . Prediabetes   . Respiratory failure requiring intubation ([place])   . Restless leg syndrome   . Shortness of breath   . Sleep apnea   . Swallowing difficulty   . Thrombocytopenia ([place]) 09/15/2019  . Trigger thumb of left hand 01/2018  . Trigger thumb of right hand    Past Surgical History:  Procedure Laterality Date  . ABDOMINAL HYSTERECTOMY  06/2016   complete  . APPLICATION OF WOUND VAC Left 04/27/2019   Procedure: APPLICATION OF WOUND VAC LEFT GROIN;  Surgeon: Waynetta Sandy, MD;  Location: [facility];  Service: Vascular;  Laterality: Left;  . AV FISTULA PLACEMENT Left 11/24/2018   Procedure: ARTERIOVENOUS (AV) FISTULA CREATION LEFT SFA TO LEFT FEMORAL VEIN;  Surgeon: Waynetta Sandy, MD;  Location: [employer] [facility];  Service: Vascular;  Laterality: Left;  . BACK SURGERY    . CHOLECYSTECTOMY    . COLONOSCOPY WITH PROPOFOL N/A 11/22/2019   Procedure: COLONOSCOPY WITH PROPOFOL;  Surgeon: [name], MD;  Location: [facility] ENDOSCOPY;  Service: Gastroenterology;  Laterality: N/A;  . FEMORAL ARTERY EXPLORATION  03/30/2019  Procedure: Left Common Femoral Artery and Vein Exploration;  Surgeon: Waynetta Sandy, MD;  Location: [place] [facility];  Service: Vascular;;  . FEMORAL-FEMORAL BYPASS GRAFT Left 11/24/2018   Procedure: BYPASS GRAFT FEMORAL-FEMORAL VENOUS LEFT TO RIGHT PALMA PROCEDURE USING CRYOVEIN;  Surgeon: Waynetta Sandy, MD;  Location: [facility];  Service: Vascular;  Laterality: Left;  . GROIN DEBRIDEMENT Left 04/27/2019   Procedure: GROIN DEBRIDEMENT;  Surgeon: Waynetta Sandy, MD;  Location: [employer];  Service: Vascular;  Laterality: Left;  . INSERTION OF ILIAC STENT  03/30/2019    Procedure: Stent of left common, external iliac veins and left common femoral vein;  Surgeon: Waynetta Sandy, MD;  Location: [facility];  Service: Vascular;;  . LOWER EXTREMITY VENOGRAPHY N/A 08/17/2018   Procedure: LOWER EXTREMITY VENOGRAPHY - Central Venogram;  Surgeon: Waynetta Sandy, MD;  Location: [facility];  Service: Cardiovascular;  Laterality: N/A;  . LOWER EXTREMITY VENOGRAPHY Bilateral 03/09/2019   Procedure: LOWER EXTREMITY VENOGRAPHY;  Surgeon: Waynetta Sandy, MD;  Location: [place] [facility] [facility];  Service: Cardiovascular;  Laterality: Bilateral;  . LOWER EXTREMITY VENOGRAPHY Left 08/16/2019   Procedure: LOWER EXTREMITY VENOGRAPHY;  Surgeon: Waynetta Sandy, MD;  Location: [facility];  Service: Cardiovascular;  Laterality: Left;  . LUMBAR FUSION  11/21/2000   L5-S1  . LUMBAR SPINE SURGERY     x 2 others  . PATCH ANGIOPLASTY Left 03/30/2019   Procedure: Patch Angioplasty of the Left Common Femoral Vein using Venosure Biologic patch;  Surgeon: Waynetta Sandy, MD;  Location: [facility];  Service: Vascular;  Laterality: Left;  . PERIPHERAL VASCULAR INTERVENTION Left 08/16/2019   Procedure: PERIPHERAL VASCULAR INTERVENTION;  Surgeon: Waynetta Sandy, MD;  Location: [facility];  Service: Cardiovascular;  Laterality: Left;  common femoral/femoral vein stent  . TRIGGER FINGER RELEASE Right 12/01/2017   Procedure: RELEASE TRIGGER FINGER/A-1 PULLEY RIGHT THUMB;  Surgeon: Leanora Cover, MD;  Location: [facility];  Service: Orthopedics;  Laterality: Right;  . TRIGGER FINGER RELEASE Left 01/26/2018   Procedure: LEFT TRIGGER THUMB RELEASE;  Surgeon: Leanora Cover, MD;  Location: [facility];  Service: Orthopedics;  Laterality: Left;  . ULTRASOUND GUIDANCE FOR VASCULAR ACCESS Right 03/30/2019   Procedure: Ultrasound-guided cannulation right internal jugular vein;  Surgeon: Waynetta Sandy, MD;   Location: [facility];  Service: Vascular;  Laterality: Right;     SOCIAL HISTORY:  Social History   Socioeconomic History  . Marital status: Divorced    Spouse name: Not on file  . Number of children: 3  . Years of education: Not on file  . Highest education level: Not on file  Occupational History  . Not on file  Tobacco Use  . Smoking status: Never Smoker  . Smokeless tobacco: Never Used  Vaping Use  . Vaping Use: Never used  Substance and Sexual Activity  . Alcohol use: No  . Drug use: No  . Sexual activity: Not Currently    Partners: Male    Birth control/protection: Surgical  Other Topics Concern  . Not on file  Social History Narrative  . Not on file   Social Determinants of Health   Financial Resource Strain:   . Difficulty of Paying Living Expenses:   Food Insecurity:   . Worried About Charity fundraiser in the Last Year:   . Arboriculturist in the Last Year:   Transportation Needs:   . Film/video editor (Medical):   Marland Kitchen  Lack of Transportation (Non-Medical):   Physical Activity:   . Days of Exercise per Week:   . Minutes of Exercise per Session:   Stress:   . Feeling of Stress :   Social Connections:   . Frequency of Communication with Friends and Family:   . Frequency of Social Gatherings with Friends and Family:   . Attends Religious Services:   . Active Member of Clubs or Organizations:   . Attends Archivist Meetings:   Marland Kitchen Marital Status:   Intimate Partner Violence:   . Fear of Current or Ex-Partner:   . Emotionally Abused:   Marland Kitchen Physically Abused:   . Sexually Abused:     FAMILY HISTORY:  Family History  Problem Relation Age of Onset  . Heart disease Mother   . Hyperlipidemia Mother   . Hypertension Mother   . Bipolar disorder Mother   . Stroke Mother   . Depression Mother   . Sleep apnea Mother   . Obesity Mother   . Diabetes Father   . Heart disease Father   . Hyperlipidemia Father   . Hypertension Father   . Sleep apnea  Father   . Obesity Father   . Drug abuse Daughter   . ADD / ADHD Daughter   . Drug abuse Daughter   . Anxiety disorder Daughter   . Bipolar disorder Daughter   . Hypertension Sister   . Hypertension Brother   . Hyperlipidemia Brother   . Heart disease Brother   . Bipolar disorder Maternal Aunt   . Suicidality Maternal Aunt     CURRENT MEDICATIONS:  Outpatient Encounter Medications as of 04/19/2020  Medication Sig  . ALPRAZolam (XANAX) 1 MG tablet Take 1 tablet (1 mg total) by mouth 2 (two) times daily as needed for anxiety.  Marland Kitchen amphetamine-dextroamphetamine (ADDERALL) 30 MG tablet Take 1 tablet by mouth 2 (two) times daily.  Marland Kitchen aspirin EC 81 MG tablet Take 81 mg by mouth daily.  Marland Kitchen atorvastatin (LIPITOR) 20 MG tablet Take 1 tablet (20 mg total) by mouth daily.  . cetirizine (ZYRTEC) 10 MG tablet Take 1 tablet (10 mg total) by mouth daily.  . cyclobenzaprine (FLEXERIL) 5 MG tablet Take 10 mg by mouth 3 (three) times daily.   . cycloSPORINE (RESTASIS) 0.05 % ophthalmic emulsion Place 1 drop into both eyes 2 (two) times daily.  Marland Kitchen FLUoxetine (PROZAC) 40 MG capsule Take 1 capsule (40 mg total) by mouth daily.  Marland Kitchen GARLIC PO Take 0,175 mg by mouth.   . hydrochlorothiazide (HYDRODIURIL) 25 MG tablet Take 1 tablet (25 mg total) by mouth daily.  Marland Kitchen levothyroxine (SYNTHROID) 150 MCG tablet Take 1 tablet (150 mcg total) by mouth 3 (three) times a week.  . levothyroxine (SYNTHROID) 175 MCG tablet Take 1 tablet (175 mcg total) by mouth 4 (four) times a week.  . linaclotide (LINZESS) 145 MCG CAPS capsule Take 1 capsule (145 mcg total) by mouth daily.  Marland Kitchen lisinopril (ZESTRIL) 20 MG tablet TAKE 1 TABLET BY MOUTH DAILY  . Magnesium 250 MG TABS Take 250 mg by mouth.  . Multiple Vitamin (MULTIVITAMIN) capsule Take 1 capsule by mouth daily.  . mupirocin ointment (BACTROBAN) 2 % Apply  a small amount to affected area three times a day  Apply for 7 days  . Olopatadine HCl 0.2 % SOLN Apply 1 drop to eye daily.    . Omega-3 Fatty Acids (OMEGA-3 FISH OIL PO) Take 600 mg by mouth.  . pantoprazole (PROTONIX) 40 MG tablet  Take 1 tablet (40 mg total) by mouth daily.  . potassium chloride (KLOR-CON) 10 MEQ tablet Take 1 tablet (10 mEq total) by mouth 3 (three) times daily.  . pregabalin (LYRICA) 100 MG capsule Take 100 mg by mouth 4 (four) times daily.  Marland Kitchen rOPINIRole (REQUIP) 2 MG tablet Take 1 tablet (2 mg total) by mouth 2 (two) times daily.  . vitamin C (ASCORBIC ACID) 500 MG tablet Take 500 mg by mouth at bedtime.  . Vitamin D, Ergocalciferol, (DRISDOL) 1.25 MG (50000 UNIT) CAPS capsule Take 1 capsule (50,000 Units total) by mouth every 7 (seven) days.  Marland Kitchen warfarin (COUMADIN) 5 MG tablet Take 1-2 tablets (5-10 mg total) by mouth See admin instructions. Take 1 and 1/2 tablets on Tuesday and Thursday then take 1 tablet all the other days  . HYDROcodone-acetaminophen (NORCO/VICODIN) 5-325 MG tablet May take 1 twice a day , if more pain control is needed, sixty tabs for 30 days   No facility-administered encounter medications on file as of 04/19/2020.    ALLERGIES:  Allergies  Allergen Reactions  . Aripiprazole Other (See Comments)    BECOMES  VIOLENT   . Seroquel [Quetiapine Fumarate] Other (See Comments)    BECOMES VIOLENT  . Chlorpromazine Other (See Comments)    SEVERE ANXIETY   . Gabapentin Other (See Comments)    NIGHTMARES      PHYSICAL EXAM:  ECOG Performance status: 1  Vitals:   04/19/20 1415 04/19/20 1416  BP: (!) 99/53 (!) 104/59  Pulse: 71   Resp: 18   Temp: 97.8 F (36.6 C)   SpO2: 95%    Filed Weights   04/19/20 1415  Weight: 274 lb 12.8 oz (124.6 kg)   Physical Exam Constitutional:      Appearance: Normal appearance. She is normal weight.  Cardiovascular:     Rate and Rhythm: Normal rate and regular rhythm.     Heart sounds: Normal heart sounds.  Pulmonary:     Effort: Pulmonary effort is normal.     Breath sounds: Normal breath sounds.  Abdominal:     General:  Bowel sounds are normal.     Palpations: Abdomen is soft.  Musculoskeletal:        General: Normal range of motion.  Skin:    General: Skin is warm.  Neurological:     Mental Status: She is alert and oriented to person, place, and time. Mental status is at baseline.  Psychiatric:        Mood and Affect: Mood normal.        Behavior: Behavior normal.        Thought Content: Thought content normal.        Judgment: Judgment normal.      LABORATORY DATA:  I have reviewed the labs as listed.  CBC    Component Value Date/Time   WBC 4.8 04/13/2020 1318   RBC 4.23 04/13/2020 1318   HGB 13.1 04/13/2020 1318   HGB 13.2 03/29/2020 1250   HCT 40.8 04/13/2020 1318   HCT 39.6 03/29/2020 1250   PLT 149 (L) 04/13/2020 1318   PLT 142 (L) 03/29/2020 1250   MCV 96.5 04/13/2020 1318   MCV 94 03/29/2020 1250   MCH 31.0 04/13/2020 1318   MCHC 32.1 04/13/2020 1318   RDW 13.1 04/13/2020 1318   RDW 12.6 03/29/2020 1250   LYMPHSABS 1.1 04/13/2020 1318   LYMPHSABS 1.4 03/29/2020 1250   MONOABS 0.6 04/13/2020 1318   EOSABS 0.0 04/13/2020 1318  EOSABS 0.2 03/29/2020 1250   BASOSABS 0.0 04/13/2020 1318   BASOSABS 0.0 03/29/2020 1250   CMP Latest Ref Rng & Units 04/13/2020 03/29/2020 02/16/2020  Glucose 70 - 99 mg/dL 118(H) 107(H) 130(H)  BUN 6 - 20 mg/dL 44(H) 24 31(H)  Creatinine 0.44 - 1.00 mg/dL 1.24(H) 1.33(H) 1.16(H)  Sodium 135 - 145 mmol/L 142 142 142  Potassium 3.5 - 5.1 mmol/L 4.3 4.8 4.3  Chloride 98 - 111 mmol/L 107 104 103  CO2 22 - 32 mmol/L 23 24 25   Calcium 8.9 - 10.3 mg/dL 9.7 9.2 9.5  Total Protein 6.5 - 8.1 g/dL 7.6 6.9 7.1  Total Bilirubin 0.3 - 1.2 mg/dL 0.3 0.3 0.4  Alkaline Phos 38 - 126 U/L 86 114 122(H)  AST 15 - 41 U/L 13(L) 16 20  ALT 0 - 44 U/L 17 20 23     All questions were answered to patient's stated satisfaction. Encouraged patient to call with any new concerns or questions before his next visit to the cancer center and we can certain see him sooner, if  needed.     ASSESSMENT & PLAN:  Normocytic anemia 1.  Normocytic anemia: -This is a combination anemia from CKD and iron deficiency. -Colonoscopy on 11/22/2019 showed 3 small polyps in the ascending colon and the cecum.  Nonbleeding internal hemorrhoids.  Pathology consistent with tubular adenoma. -She has taken iron pills in the past without much improvement. -She has a history of blood transfusions in the past. -Last Feraheme infusion was 10/14/2019 and 10/21/2019. -Labs done on 04/13/2020 showed hemoglobin 13.1, ferritin 40, percent saturation 28, creatinine 1.24 -Patient reports she is having extreme fatigue throughout the day.  She is very sleepy and sleeps a lot. -We will give her 2 iron infusions due to her fatigue. -We will see her back and 4 months with repeat labs  2.  Recurrent left leg DVT: -She had her first episode of left leg DVT in 2014. -Recurrent episode was while taking Xarelto. -She is currently on Coumadin.     Orders placed this encounter:  Orders Placed This Encounter  Procedures  . Lactate dehydrogenase  . CBC with Differential/Platelet  . Comprehensive metabolic panel  . Ferritin  . Iron and TIBC  . Vitamin B12  . VITAMIN D 25 Hydroxy (Vit-D Deficiency, Fractures)  . Folate      Francene Finders, FNP-C [place] [phone]

## 2020-04-19 NOTE — Patient Instructions (Signed)
[facility] Discharge Instructions  Follow up in 4 months with labs    Thank you for choosing [facility] at [facility] to provide your oncology and hematology care.  To afford each patient quality time with our provider, please arrive at least 15 minutes before your scheduled appointment time.   If you have a lab appointment with the Cancer Center please come in thru the Main Entrance and check in at the main information desk.  You need to re-schedule your appointment should you arrive 10 or more minutes late.  We strive to give you quality time with our providers, and arriving late affects you and other patients whose appointments are after yours.  Also, if you no show three or more times for appointments you may be dismissed from the clinic at the providers discretion.     Again, thank you for choosing [facility].  Our hope is that these requests will decrease the amount of time that you wait before being seen by our physicians.       _____________________________________________________________  Should you have questions after your visit to [facility], please contact our office at [phone] between the hours of 8:00 a.m. and 4:30 p.m.  Voicemails left after 4:00 p.m. will not be returned until the following business day.  For prescription refill requests, have your pharmacy contact our office and allow 72 hours.    Due to Covid, you will need to wear a mask upon entering the hospital. If you do not have a mask, a mask will be given to you at the Main Entrance upon arrival. For doctor visits, patients may have 1 support person with them. For treatment visits, patients can not have anyone with them due to social distancing guidelines and our immunocompromised population.      

## 2020-04-20 DIAGNOSIS — E039 Hypothyroidism, unspecified: Secondary | ICD-10-CM | POA: Diagnosis not present

## 2020-04-21 ENCOUNTER — Ambulatory Visit
Admission: RE | Admit: 2020-04-21 | Discharge: 2020-04-21 | Disposition: A | Discharge status: Home / Self Care | Payer: Medicaid Other | Source: Ambulatory Visit | Attending: Internal Medicine | Admitting: Internal Medicine

## 2020-04-21 ENCOUNTER — Other Ambulatory Visit: Payer: Self-pay

## 2020-04-21 DIAGNOSIS — I87022 Postthrombotic syndrome with inflammation of left lower extremity: Secondary | ICD-10-CM | POA: Diagnosis not present

## 2020-04-21 DIAGNOSIS — E039 Hypothyroidism, unspecified: Secondary | ICD-10-CM | POA: Diagnosis not present

## 2020-04-21 DIAGNOSIS — R600 Localized edema: Secondary | ICD-10-CM | POA: Diagnosis not present

## 2020-04-21 NOTE — Progress Notes (Signed)
Established patient visit   Patient: Jane [name]   DOB: August 22, 1966   54 y.o. Female  MRN: [id number] Visit Date: 04/24/2020  I,Sulibeya S Dimas,acting as a scribe for Lavon Paganini, MD.,have documented all relevant documentation on the behalf of Lavon Paganini, MD,as directed by  Lavon Paganini, MD while in the presence of Lavon Paganini, MD.  Today's healthcare provider: Lavon Paganini, MD   Chief Complaint  Patient presents with  . Diabetes  . Hypertension  . Hyperlipidemia  . Hypothyroidism   Subjective    HPI  Diabetes Mellitus Type II, follow-up  Lab Results  Component Value Date   HGBA1C 6.2 (H) 03/29/2020   HGBA1C 6.3 (H) 10/25/2019   HGBA1C 6.3 (H) 03/26/2019   Last seen for diabetes 3 months ago. Patient was seen at [facility] on 03/29/2020 and had [name] checked. Management since then includes no changes. Home blood sugar records: fasting range: not being checked  Episodes of hypoglycemia? No    Current insulin regiment: NONE Most Recent Eye Exam:   --------------------------------------------------------------------------------------------------- Hypertension, follow-up  BP Readings from Last 3 Encounters:  04/24/20 110/68  04/24/20 (!) 146/82  04/19/20 (!) 104/59   Wt Readings from Last 3 Encounters:  04/24/20 274 lb (124.3 kg)  04/24/20 275 lb (124.7 kg)  04/19/20 274 lb 12.8 oz (124.6 kg)     Last seen for hypertension 4 months ago.  Management since then includes continue HCTZ and add Lisinopril 20mg  daily. She reports excellent compliance with treatment. She is not having side effects.  She is not exercising. She is adherent to low salt diet.   Outside blood pressures are stable.  She does not smoke.  Use of agents associated with hypertension: none.   --------------------------------------------------------------------------------------------------- Lipid/Cholesterol, follow-up  Last Lipid  Panel: Lab Results  Component Value Date   CHOL 203 (H) 03/29/2020   LDLCALC 114 (H) 03/29/2020   HDL 37 (L) 03/29/2020   TRIG 296 (H) 03/29/2020    She was last seen for this 6 months ago.  Management since that visit includes no changes, continue atorvastatin 20 mg daily and baby aspirin daily.  She reports excellent compliance with treatment. She is not having side effects.   Symptoms: No appetite changes No foot ulcerations  No chest pain No chest pressure/discomfort  No dyspnea No orthopnea  Yes fatigue Yes lower extremity edema  No palpitations No paroxysmal nocturnal dyspnea  No nausea Yes numbness or tingling of extremity  Yes polydipsia Yes polyuria  No speech difficulty No syncope   She is following a Low Sodium diet. Current exercise: none  Last metabolic panel Lab Results  Component Value Date   GLUCOSE 118 (H) 04/13/2020   NA 142 04/13/2020   K 4.3 04/13/2020   BUN 44 (H) 04/13/2020   CREATININE 1.24 (H) 04/13/2020   GFRNONAA 50 (L) 04/13/2020   GFRAA 57 (L) 04/13/2020   CALCIUM 9.7 04/13/2020   AST 13 (L) 04/13/2020   ALT 17 04/13/2020   The 10-year ASCVD risk score Mikey Bussing DC Jr., et al., 2013) is: 4.8%  --------------------------------------------------------------------------------------------------- Follow up for Hypothroid  The patient was last seen for this 3 months ago. Patient was seen at [facility] on 04/12/2020 and had labs checked. Dr. Adair Patter made changes to medications. Changes made at last visit include increasing levothyroxine to 14mcg 4 days a week and 195mcg 3 days a week.  She reports excellent compliance with treatment. She feels that condition is Improved. She  is not having side effects.   Lab Results  Component Value Date   TSH 0.275 (L) 03/29/2020    -----------------------------------------------------------------------------------------    Patient Active Problem List   Diagnosis Date Noted  .  Chronic pain syndrome 04/24/2020  . Pharmacologic therapy 04/24/2020  . Disorder of skeletal system 04/24/2020  . Problems influencing health status 04/24/2020  . History of thrombocytopenia 04/24/2020  . Abnormal MRI, cervical spine (01/08/2017) 04/24/2020  . Abnormal MRI, lumbar spine (05/11/2014) 04/24/2020  . DDD (degenerative disc disease), cervical 04/24/2020  . DDD (degenerative disc disease), lumbar 04/24/2020  . 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 Diabetic peripheral neuropathy ([place]) 04/24/2020  . Neurogenic pain 04/24/2020  . Chronic musculoskeletal pain 04/24/2020  . Other proteinuria 03/30/2020  . Myalgia 03/15/2020  . Bilateral hand swelling 02/16/2020  . Thrush 01/20/2020  . Morbid obesity ([place]) 10/25/2019  . History of back surgery 10/25/2019  . Peripheral neuropathy 10/25/2019  . Restless leg syndrome 10/25/2019  . Chronic low back pain (Bilateral) w/ sciatica (Bilateral) 10/25/2019  . Essential hypertension 10/25/2019  . Type 2 diabetes mellitus with stage 3 chronic kidney disease, without long-term current use of insulin ([place]) 10/25/2019  . Chronic anticoagulation (Coumadin) 10/25/2019  . Hyperlipidemia associated with type 2 diabetes mellitus ([place]) 10/25/2019  . CKD stage 3 due to type 2 diabetes mellitus ([place]) 10/25/2019  . GERD (gastroesophageal reflux disease) 10/25/2019  . Allergic rhinitis 10/25/2019  . Chronic constipation 10/25/2019  . Hypothyroidism   . Normocytic anemia 09/15/2019  . Wound of left groin 04/27/2019  . 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 Chronic venous insufficiency 11/21/2017  . Displacement of lumbar intervertebral disc without myelopathy 07/14/2017  . Radicular low back pain 05/25/2014  . Depression, major, recurrent ([place]) 06/17/2013  . OCD (obsessive compulsive disorder) 11/20/2012  . 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 Bipolar 1 disorder ([place]) 09/25/2012  . History of  recurrent deep vein thrombosis (DVT) 08/18/2012   Past Medical History:  Diagnosis Date  . ADD (attention deficit disorder)   . Anxiety   . Back pain   . Bilateral swelling of feet   . Bipolar 1 disorder ([place])   . Bipolar 1 disorder ([place])   . Bipolar disorder ([place])   . Chewing difficulty   . Chronic fatigue syndrome   . Chronic kidney disease    Stage 3 kidney disease;dx by Dr. Sinda Du.   . Constipation   . Depression   . Diabetes mellitus without complication ([place])   . Dyspnea    with exertion  . GERD (gastroesophageal reflux disease)   . Headache    migraines  . High cholesterol   . History of blood clots   . History of DVT (deep vein thrombosis)    left leg  . Hypertension    states under control with meds., has been on med. x 2 years  . Hypothyroidism   . Joint pain   . Neuropathy   . Obsessive-compulsive disorder   . Peripheral vascular disease ([place])   . Prediabetes   . Respiratory failure requiring intubation ([place])   . Restless leg syndrome   . Shortness of breath   . Sleep apnea   . Swallowing difficulty   . Thrombocytopenia ([place]) 09/15/2019  . Trigger thumb of left hand 01/2018  . Trigger thumb of right hand    Social History   Tobacco Use  . Smoking status: Never Smoker  . Smokeless tobacco: Never  Used  Vaping Use  . Vaping Use: Never used  Substance Use Topics  . Alcohol use: No  . Drug use: No   Allergies  Allergen Reactions  . Aripiprazole Other (See Comments)    BECOMES  VIOLENT   . Seroquel [Quetiapine Fumarate] Other (See Comments)    BECOMES VIOLENT  . Chlorpromazine Other (See Comments)    SEVERE ANXIETY   . Gabapentin Other (See Comments)    NIGHTMARES      Medications: Outpatient Medications Prior to Visit  Medication Sig  . ALPRAZolam (XANAX) 1 MG tablet Take 1 tablet (1 mg total) by mouth 2 (two) times daily as needed for anxiety.  Marland Kitchen amphetamine-dextroamphetamine (ADDERALL) 30 MG tablet Take 1 tablet by mouth 2  (two) times daily.  Marland Kitchen aspirin EC 81 MG tablet Take 81 mg by mouth daily.  Marland Kitchen atorvastatin (LIPITOR) 20 MG tablet Take 1 tablet (20 mg total) by mouth daily.  . cetirizine (ZYRTEC) 10 MG tablet Take 1 tablet (10 mg total) by mouth daily.  . cyclobenzaprine (FLEXERIL) 5 MG tablet Take 5 mg by mouth 4 (four) times daily as needed for muscle spasms.   . cycloSPORINE (RESTASIS) 0.05 % ophthalmic emulsion Place 1 drop into both eyes 2 (two) times daily.  Marland Kitchen FLUoxetine (PROZAC) 40 MG capsule Take 1 capsule (40 mg total) by mouth daily.  Marland Kitchen GARLIC PO Take 0,786 mg by mouth.   . hydrochlorothiazide (HYDRODIURIL) 25 MG tablet Take 1 tablet (25 mg total) by mouth daily.  Marland Kitchen HYDROcodone-acetaminophen (NORCO/VICODIN) 5-325 MG tablet May take 1 twice a day , if more pain control is needed, sixty tabs for 30 days  . levothyroxine (SYNTHROID) 150 MCG tablet Take 1 tablet (150 mcg total) by mouth 3 (three) times a week.  . levothyroxine (SYNTHROID) 175 MCG tablet Take 1 tablet (175 mcg total) by mouth 4 (four) times a week.  . linaclotide (LINZESS) 145 MCG CAPS capsule Take 1 capsule (145 mcg total) by mouth daily.  Marland Kitchen lisinopril (ZESTRIL) 20 MG tablet TAKE 1 TABLET BY MOUTH DAILY  . Magnesium 250 MG TABS Take 250 mg by mouth.  . Multiple Vitamin (MULTIVITAMIN) capsule Take 1 capsule by mouth daily.  . Olopatadine HCl 0.2 % SOLN Apply 1 drop to eye daily.  . Omega-3 Fatty Acids (OMEGA-3 FISH OIL PO) Take 600 mg by mouth.  . pantoprazole (PROTONIX) 40 MG tablet Take 1 tablet (40 mg total) by mouth daily.  . potassium chloride (KLOR-CON) 10 MEQ tablet Take 1 tablet (10 mEq total) by mouth 3 (three) times daily.  . pregabalin (LYRICA) 100 MG capsule Take 100 mg by mouth 4 (four) times daily.  Marland Kitchen rOPINIRole (REQUIP) 2 MG tablet Take 1 tablet (2 mg total) by mouth 2 (two) times daily.  . vitamin C (ASCORBIC ACID) 500 MG tablet Take 500 mg by mouth at bedtime.  . Vitamin D, Ergocalciferol, (DRISDOL) 1.25 MG (50000 UNIT)  CAPS capsule Take 1 capsule (50,000 Units total) by mouth every 7 (seven) days.  Marland Kitchen warfarin (COUMADIN) 5 MG tablet Take 1-2 tablets (5-10 mg total) by mouth See admin instructions. Take 1 and 1/2 tablets on Tuesday and Thursday then take 1 tablet all the other days (Patient taking differently: Take 5-10 mg by mouth See admin instructions. Take 1/2 tablets on Tuesday then take 1 tablet all the other days)  . [DISCONTINUED] mupirocin ointment (BACTROBAN) 2 % Apply  a small amount to affected area three times a day  Apply for 7 days (  Patient not taking: Reported on 04/24/2020)   No facility-administered medications prior to visit.    Review of Systems  Constitutional: Negative.   Eyes: Negative.   Respiratory: Negative.   Cardiovascular: Negative.   Endocrine: Negative.       Objective    BP 110/68 (BP Location: Left Arm, Patient Position: Sitting, Cuff Size: Large)   Pulse 92   Temp (!) 96.9 F (36.1 C) (Temporal)   Resp 16   Ht 5\' 8"  (1.727 m)   Wt 274 lb (124.3 kg)   LMP  (LMP Unknown)   SpO2 99%   BMI 41.66 kg/m   Wt Readings from Last 3 Encounters:  04/24/20 274 lb (124.3 kg)  04/24/20 275 lb (124.7 kg)  04/19/20 274 lb 12.8 oz (124.6 kg)    Physical Exam Vitals reviewed.  Constitutional:      General: She is not in acute distress.    Appearance: Normal appearance. She is well-developed. She is not diaphoretic.  HENT:     Head: Normocephalic and atraumatic.  Eyes:     General: No scleral icterus.    Conjunctiva/sclera: Conjunctivae normal.  Neck:     Thyroid: No thyromegaly.  Cardiovascular:     Rate and Rhythm: Normal rate and regular rhythm.     Pulses: Normal pulses.     Heart sounds: Normal heart sounds. No murmur heard.   Pulmonary:     Effort: Pulmonary effort is normal. No respiratory distress.     Breath sounds: Normal breath sounds. No wheezing, rhonchi or rales.  Musculoskeletal:     Cervical back: Neck supple.     Right lower leg: No edema.      Left lower leg: Edema (trace) present.  Lymphadenopathy:     Cervical: No cervical adenopathy.  Skin:    General: Skin is warm and dry.     Findings: No rash.  Neurological:     Mental Status: She is alert and oriented to person, place, and time. Mental status is at baseline.  Psychiatric:        Mood and Affect: Mood normal.        Behavior: Behavior normal.      Results for orders placed or performed in visit on 04/24/20  POCT INR  Result Value Ref Range   INR 1.7 (A) 2.0 - 3.0   PT 19.8     Assessment & Plan     Problem List Items Addressed This Visit      Cardiovascular and Mediastinum   Essential hypertension - Primary    Well controlled, borderline low but asymptomatic Will monitor at home and call if she has consistent hypertension is symptomatic Continue current medications Reviewed recent metabolic panel F/u in 6 months         Endocrine   Hypothyroidism    TSH low on last lab check by Dr. Adair Patter Continue Synthroid  Levothyroxine 175 mcg 4 days a week and 150 mcg 3 days a week - recent dose change by Dr. Adair Patter She is planning to have repeat TSH and other labs by Dr. Adair Patter in the next couple months       Type 2 diabetes mellitus with stage 3 chronic kidney disease, without long-term current use of insulin ([place])    Well controlled with last [name] 6.1 Not on any oral medications UTD on vaccines, foot exam ROI sent for last eye exam On Statin Discussed diet and exercise F/u in 6 months  Hyperlipidemia associated with type 2 diabetes mellitus ([place])    Labs checked on 03/29/2020 by Dr. Adair Patter Continue statin at current dose May increase medication if LDL continues to be above goal Goal LDL < 70         Other   History of recurrent deep vein thrombosis (DVT)    INR checked today and low at 1.7 She has previously been too thin at 3.5 and 3.6 She has significantly increased her greens intake Discussed importance of consistency in vitamin  K containing food intake Increase her dose back to 5 mg daily of warfarin Recheck INR in 2 weeks          ROI sent for last mammogram and eye exam   Return in about 2 weeks (around 05/08/2020).      I, Lavon Paganini, MD, have reviewed all documentation for this visit. The documentation on 04/24/20 for the exam, diagnosis, procedures, and orders are all accurate and complete.   Kacy Conely, Dionne Bucy, MD, MPH [facility]

## 2020-04-22 DIAGNOSIS — E039 Hypothyroidism, unspecified: Secondary | ICD-10-CM | POA: Diagnosis not present

## 2020-04-23 DIAGNOSIS — E039 Hypothyroidism, unspecified: Secondary | ICD-10-CM | POA: Diagnosis not present

## 2020-04-23 NOTE — Progress Notes (Signed)
Patient: Jane [name]  Service Category: E/M  Provider: Oswaldo Done, MD  DOB: Dec 21, 1965  DOS: 04/24/2020  Referring Provider: Suella Grove*  MRN: [id number]  Setting: Ambulatory outpatient  PCP: Erasmo Downer, MD  Type: New Patient  Specialty: Interventional Pain Management    Location: Office  Delivery: Face-to-face     Primary Reason(s) for Visit: Encounter for initial evaluation of one or more chronic problems (new to examiner) potentially causing chronic pain, and posing a threat to normal musculoskeletal function. (Level of risk: High) CC: Back Pain (low)  HPI  Jane [name] is a 54 y.o. year old, female patient, who comes today to see [place] for the first time for an initial evaluation of her chronic pain. 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); Essential hypertension; Type 2 diabetes mellitus with stage 3 chronic kidney disease, without long-term current use of insulin (HCC); Chronic anticoagulation (Coumadin); Hyperlipidemia associated with type 2 diabetes mellitus (HCC); CKD stage 3 due to type 2 diabetes mellitus (HCC); GERD (gastroesophageal reflux disease); Allergic rhinitis; Chronic constipation; Displacement of lumbar intervertebral disc without myelopathy; Thrush; Bilateral hand swelling; Myalgia; Other proteinuria; Chronic pain syndrome; Pharmacologic therapy; Disorder of skeletal system; Problems influencing health status; History of thrombocytopenia; Abnormal MRI, cervical spine (01/08/2017); Abnormal MRI, lumbar  spine (05/11/2014); DDD (degenerative disc disease), cervical; DDD (degenerative disc disease), lumbar; Failed back surgical syndrome; Diabetic peripheral neuropathy (HCC); Neurogenic pain; and Chronic musculoskeletal pain on their problem list. Today she comes in for evaluation of her Back Pain (low)  Pain Assessment: Location: Lower Back Radiating: radiates down the back of left leg turning to the side at mid calf and into foot. Onset: More than a month ago Duration: Chronic pain Quality: Constant, Sharp (feels like punching in lower back.) Severity: 8 /10 (subjective, self-reported pain score)  Note: Reported level is compatible with observation.                         When using our objective Pain Scale, levels between 6 and 10/10 are said to belong in an emergency room, as it progressively worsens from a 6/10, described as severely limiting, requiring emergency care not usually available at an outpatient pain management facility. At a 6/10 level, communication becomes difficult and requires great effort. Assistance to reach the emergency department may be required. Facial flushing and profuse sweating along with potentially dangerous increases in heart rate and blood pressure will be evident. Effect on ADL: "I have to constantly change position" Timing: Constant Modifying factors: denies BP: (!) 146/82  HR: 82  Onset and Duration: Gradual and Present longer than 3 months Cause of pain: Unknown Severity: Getting worse, NAS-11 at its worse: 10/10, NAS-11 at its best: 8/10, NAS-11 now: 8/10 and NAS-11 on the average: 8/10 Timing: During activity or exercise and After activity or exercise Aggravating Factors: Bending, Climbing, Intercourse (sex), Kneeling, Lifiting, Motion, Prolonged sitting, Prolonged standing, Squatting, Stooping , Twisting, Walking, Walking uphill, Walking downhill and Working Alleviating Factors: Cold packs, Hot packs, Lying down and Medications Associated Problems:  Jane-time cramps, Night-time cramps, Depression, Fatigue, Numbness, Personality changes, Sadness, Spasms, Sweating,  Swelling, Tingling, Weakness, Pain that wakes patient up and Pain that does not allow patient to sleep Quality of Pain: Aching, Annoying, Burning, Cramping, Dull, Feeling of weight, Heavy, Horrible, Nagging, Sharp, Stabbing, Tender, Throbbing, Tingling and Uncomfortable Previous Examinations or Tests: CT scan, MRI scan and X-rays Previous Treatments: Narcotic medications and Trigger point injections  The patient comes into the clinics today for the first time for a chronic pain management evaluation.  According to the patient the primary area of pain is that of the left lower back with no pain radiating onto the right side.  She admits to having had 3 back surgeries by Dr. Carloyn Manner, the last one having been around 2015.  She indicates that she has not had any physical therapy because it was not paid by Medicaid, who only agreed to pay for one single visit.  She has had some x-rays done by Dr. [name].  The patient's secondary area of pain is that of the left hip and buttocks area, which she refers is associated with the low back pain.  The pain runs down the back of her leg to the buttocks and occasionally just below the knee.  However, she has difficulty telling if there is any type of involvement of her foot secondary to the fact that she has bilateral diabetic peripheral neuropathy.  The patient's third area of pain is that of her feet secondary to the bilateral diabetic peripheral neuropathy.  The patient indicates having had a nerve conduction test that was done many years ago in [place], with a confirmed that she had that neuropathy.  The patient indicates that she is being followed by Dr. Gwenlyn Saran from vein and vascular surgery in [facility] for her Coumadin.  She indicates that currently she takes 3.5 mg/Jane but she has been up as high as 5 mg and as low as 1.  She is currently getting her  pain medication from her primary care physician.  Finally, the patient also indicates having some pain in the back of her neck, with no radiation of the pain to the upper extremities.  Regarding the patient's medications, she indicates currently taking hydrocodone/APAP 5/325 1 tablet p.o. twice daily.  Labs show her kidney function to be having some problems with some probable chronic kidney disease stage III-4.  Liver enzymes seem to be adequate.  The patient also indicates that in the past she has taken as much as 10 mg of hydrocodone 4 times a Jane, without any problems.  After today's evaluation, I suggested that perhaps she be switched to oxycodone IR 5 mg 3 times daily.  Final decision as to what and how much she will take depends on their review and evaluation of Dr. Gillis Santa.  Today I took the time to provide the patient with information regarding my pain practice. The patient was informed that my practice is divided into two sections: an interventional pain management section, as well as a completely separate and distinct medication management section. I explained that I have procedure days for my interventional therapies, and evaluation days for follow-ups and medication management. Because of the amount of documentation required during both, they are kept separated. This means that there is the possibility that she may be scheduled for a procedure on one Jane, and medication management the next. I have also informed her that because of staffing and facility limitations, I no longer take patients for medication management only. To illustrate the reasons for this, I gave the patient the example of surgeons,  and how inappropriate it would be to refer a patient to his/her care, just to write for the post-surgical antibiotics on a surgery done by a different surgeon.   Because interventional pain management is my board-certified specialty, the patient was informed that joining my practice means that  they are open to any and all interventional therapies. I made it clear that this does not mean that they will be forced to have any procedures done. What this means is that I believe interventional therapies to be essential part of the diagnosis and proper management of chronic pain conditions. Therefore, patients not interested in these interventional alternatives will be better served under the care of a different practitioner.  The patient was also made aware of my Comprehensive Pain Management Safety Guidelines where by joining my practice, they limit all of their nerve blocks and joint injections to those done by our practice, for as long as we are retained to manage their care.   Historic Controlled Substance Pharmacotherapy Review  PMP and historical list of controlled substances: Pregabalin (Lyrica) 100 mg 4 times daily; alprazolam 1 mg twice daily; hydrocodone/APAP 5/325; dextroamphetamine/amphetamine 30 mg; oxycodone/APAP 5/325; hydrocodone/APAP 10/325 Highest opioid analgesic regimen found: Hydrocodone/APAP 10/325 1 tablet p.o. 4 times daily (40 mg/Jane of hydrocodone) (40 MME) Most recent opioid analgesic: Hydrocodone/APAP 5/325 1 tablet p.o. twice daily (10 mg/Jane of hydrocodone) (10 MME/Jane) Current opioid analgesics:  Hydrocodone/APAP 5/325 1 tablet p.o. twice daily (10 mg/Jane of hydrocodone) (10 MME/Jane)  Highest recorded MME/Jane: 40 mg/Jane MME/Jane: 10 mg/Jane  Medications: The patient did not bring the medication(s) to the appointment, as requested in our "New Patient Package" Pharmacodynamics: Desired effects: Analgesia: The patient reports >50% benefit. Reported improvement in function: The patient reports medication allows her to accomplish basic ADLs. Clinically meaningful improvement in function (CMIF): Sustained CMIF goals met Perceived effectiveness: Described as relatively effective, allowing for increase in activities of daily living (ADL) Undesirable effects: Side-effects  or Adverse reactions: None reported Historical Monitoring: The patient  reports no history of drug use. List of all UDS Test(s): No results found. List of other Serum/Urine Drug Screening Test(s):  No results found. Historical Background Evaluation: [place] PMP: PDMP reviewed during this encounter. Six (6) year initial data search conducted.             PMP NARX Score Report:  Narcotic: 511 Sedative: 782 Stimulant: 47 [place] Department of public safety, offender search: Editor, commissioning Information) Non-contributory Risk Assessment Profile: Aberrant behavior: None observed or detected today Risk factors for fatal opioid overdose: None identified today PMP NARX Overdose Risk Score: 590 Fatal overdose hazard ratio (HR): Calculation deferred Non-fatal overdose hazard ratio (HR): Calculation deferred Risk of opioid abuse or dependence: 0.7-3.0% with doses ? 36 MME/Jane and 6.1-26% with doses ? 120 MME/Jane. Substance use disorder (SUD) risk level: See below Personal History of Substance Abuse (SUD-Substance use disorder):  Alcohol: Negative  Illegal Drugs: Negative  Rx Drugs: Negative  ORT Risk Level calculation: Low Risk  Opioid Risk Tool - 04/24/20 1150      Family History of Substance Abuse   Alcohol Negative    Illegal Drugs Negative    Rx Drugs Negative      Personal History of Substance Abuse   Alcohol Negative    Illegal Drugs Negative    Rx Drugs Negative      Age   Age between 7-45 years  No      History of Preadolescent Sexual Abuse   History of Preadolescent Sexual  Abuse Negative or Female      Psychological Disease   Psychological Disease Positive    Bipolar Positive    Depression Positive      Total Score   Opioid Risk Tool Scoring 3    Opioid Risk Interpretation Low Risk          ORT Scoring interpretation table:  Score <3 = Low Risk for SUD  Score between 4-7 = Moderate Risk for SUD  Score >8 = High Risk for Opioid Abuse   PHQ-2 Depression Scale:  Total score:  0  PHQ-2 Scoring interpretation table: (Score and probability of major depressive disorder)  Score 0 = No depression  Score 1 = 15.4% Probability  Score 2 = 21.1% Probability  Score 3 = 38.4% Probability  Score 4 = 45.5% Probability  Score 5 = 56.4% Probability  Score 6 = 78.6% Probability   PHQ-9 Depression Scale:  Total score: 0  PHQ-9 Scoring interpretation table:  Score 0-4 = No depression  Score 5-9 = Mild depression  Score 10-14 = Moderate depression  Score 15-19 = Moderately severe depression  Score 20-27 = Severe depression (2.4 times higher risk of SUD and 2.89 times higher risk of overuse)   Pharmacologic Plan: As per protocol, I have not taken over any controlled substance management, pending the results of ordered tests and/or consults.            Initial impression: Pending review of available data and ordered tests.  Meds   Current Outpatient Medications:  .  ALPRAZolam (XANAX) 1 MG tablet, Take 1 tablet (1 mg total) by mouth 2 (two) times daily as needed for anxiety., Disp: 60 tablet, Rfl: 2 .  amphetamine-dextroamphetamine (ADDERALL) 30 MG tablet, Take 1 tablet by mouth 2 (two) times daily., Disp: 60 tablet, Rfl: 0 .  aspirin EC 81 MG tablet, Take 81 mg by mouth daily., Disp: , Rfl:  .  atorvastatin (LIPITOR) 20 MG tablet, Take 1 tablet (20 mg total) by mouth daily., Disp: 90 tablet, Rfl: 3 .  cetirizine (ZYRTEC) 10 MG tablet, Take 1 tablet (10 mg total) by mouth daily., Disp: 90 tablet, Rfl: 1 .  cyclobenzaprine (FLEXERIL) 5 MG tablet, Take 5 mg by mouth 4 (four) times daily as needed for muscle spasms. , Disp: , Rfl:  .  cycloSPORINE (RESTASIS) 0.05 % ophthalmic emulsion, Place 1 drop into both eyes 2 (two) times daily., Disp: , Rfl:  .  FLUoxetine (PROZAC) 40 MG capsule, Take 1 capsule (40 mg total) by mouth daily., Disp: 30 capsule, Rfl: 2 .  GARLIC PO, Take 1,062 mg by mouth. , Disp: , Rfl:  .  hydrochlorothiazide (HYDRODIURIL) 25 MG tablet, Take 1 tablet (25  mg total) by mouth daily., Disp: 90 tablet, Rfl: 3 .  HYDROcodone-acetaminophen (NORCO/VICODIN) 5-325 MG tablet, May take 1 twice a Jane , if more pain control is needed, sixty tabs for 30 days, Disp: , Rfl:  .  levothyroxine (SYNTHROID) 150 MCG tablet, Take 1 tablet (150 mcg total) by mouth 3 (three) times a week., Disp: 30 tablet, Rfl: 0 .  levothyroxine (SYNTHROID) 175 MCG tablet, Take 1 tablet (175 mcg total) by mouth 4 (four) times a week., Disp: , Rfl:  .  linaclotide (LINZESS) 145 MCG CAPS capsule, Take 1 capsule (145 mcg total) by mouth daily., Disp: 90 capsule, Rfl: 3 .  lisinopril (ZESTRIL) 20 MG tablet, TAKE 1 TABLET BY MOUTH DAILY, Disp: 90 tablet, Rfl: 0 .  Magnesium 250 MG TABS, Take  250 mg by mouth., Disp: , Rfl:  .  Multiple Vitamin (MULTIVITAMIN) capsule, Take 1 capsule by mouth daily., Disp: , Rfl:  .  Olopatadine HCl 0.2 % SOLN, Apply 1 drop to eye daily., Disp: , Rfl:  .  Omega-3 Fatty Acids (OMEGA-3 FISH OIL PO), Take 600 mg by mouth., Disp: , Rfl:  .  pantoprazole (PROTONIX) 40 MG tablet, Take 1 tablet (40 mg total) by mouth daily., Disp: 90 tablet, Rfl: 3 .  potassium chloride (KLOR-CON) 10 MEQ tablet, Take 1 tablet (10 mEq total) by mouth 3 (three) times daily., Disp: 270 tablet, Rfl: 3 .  pregabalin (LYRICA) 100 MG capsule, Take 100 mg by mouth 4 (four) times daily., Disp: , Rfl:  .  rOPINIRole (REQUIP) 2 MG tablet, Take 1 tablet (2 mg total) by mouth 2 (two) times daily., Disp: 180 tablet, Rfl: 3 .  vitamin C (ASCORBIC ACID) 500 MG tablet, Take 500 mg by mouth at bedtime., Disp: , Rfl:  .  Vitamin D, Ergocalciferol, (DRISDOL) 1.25 MG (50000 UNIT) CAPS capsule, Take 1 capsule (50,000 Units total) by mouth every 7 (seven) days., Disp: 4 capsule, Rfl: 0 .  warfarin (COUMADIN) 5 MG tablet, Take 1-2 tablets (5-10 mg total) by mouth See admin instructions. Take 1 and 1/2 tablets on Tuesday and Thursday then take 1 tablet all the other days (Patient taking differently: Take 5-10 mg  by mouth See admin instructions. Take 1/2 tablets on Tuesday then take 1 tablet all the other days), Disp: 60 tablet, Rfl: 1 .  mupirocin ointment (BACTROBAN) 2 %, Apply  a small amount to affected area three times a Jane  Apply for 7 days (Patient not taking: Reported on 04/24/2020), Disp: , Rfl:   Imaging Review  Cervical Imaging: MR CERVICAL SPINE WO CONTRAST  Narrative CLINICAL DATA:  Neck pain and right arm pain with weakness and numbness.  EXAM: MRI CERVICAL SPINE WITHOUT CONTRAST  TECHNIQUE: Multiplanar, multisequence MR imaging of the cervical spine was performed. No intravenous contrast was administered.  COMPARISON:  Radiographs dated 12/28/2015  FINDINGS: Alignment: Physiologic.  Vertebrae: No fracture, evidence of discitis, or bone lesion. No facet arthritis. No foraminal or spinal stenosis ease.  Cord: Normal signal and morphology.  Posterior Fossa, vertebral arteries, paraspinal tissues: Negative.  Disc levels:  Craniocervical junction through C2-3:  Normal.  C3-4: Tiny disc bulge into the left lateral recess with no neural impingement. Otherwise normal.  C4-5:  Tiny broad-based disc bulge with no neural impingement.  C5-6: Small central subligamentous soft disc protrusion touching the ventral aspect of the spinal cord but without compression. Widely patent lateral recesses and neural foramina.  C6-7: Small uncinate spurs asymmetric to the left into the left lateral recess without foraminal stenosis or focal neural impingement.  C7-T1:  Normal.  IMPRESSION: 1. Small central soft disc protrusion at C5-6 with no neural impingement. 2. Minimal degenerative disc disease at C3-4, C4-5, and C6-7.   Electronically Signed By: Lorriane Shire M.D. On: 01/08/2017 16:51  DG Cervical Spine Complete  Narrative CLINICAL DATA:  Patient had blood clot in l leg June 2014 whit pain foot and ankle associated, rt arm numbness and posterior neck pain, no  injury  EXAM: CERVICAL SPINE - COMPLETE 4+ VIEW  COMPARISON:  None.  FINDINGS: No fracture.  No spondylolisthesis.  Disc spaces are well maintained. There are small endplate osteophytes at C5-C6. No other degenerative change. Neural foramina are well preserved.  Soft tissues are unremarkable.  IMPRESSION: 1. No fracture  or acute finding. 2. Minor endplate spurring at Z7-Q7.  No other abnormality.   Electronically Signed By: Lajean Manes M.D. On: 12/28/2015 14:25  Lumbosacral Imaging: MR Lumbar Spine Wo Contrast  Narrative CLINICAL DATA:  Chronic low back pain extending into the left leg with worsening after recent injury. Prior lumbar fusion.  EXAM: MRI LUMBAR SPINE WITHOUT CONTRAST  TECHNIQUE: Multiplanar, multisequence MR imaging of the lumbar spine was performed. No intravenous contrast was administered.  COMPARISON:  Radiographs 03/23/2014.  MRI 09/20/2010.  FINDINGS: Five lumbar type vertebral bodies are assumed. The alignment is normal. There is no evidence of fracture or pars defect. Patient is status post laminectomy and Ray cage fusion at L5-S1.  The conus medullaris extends to the L2 level and appears normal. No paraspinal abnormalities are identified.  There are no significant disc space findings from T11-12 through L2-3.  L3-4: Stable disc bulging with mild loss of disc height. No disc herniation, spinal stenosis or nerve root encroachment.  L4-5: Progressive loss of disc height with interval development of a moderate-sized posterolateral disc extrusion on the left. There is caudal migration of disc material behind the L5 vertebral body, displacing the thecal sac posteriorly and encroaching on the left L5 nerve root. There is no foraminal extension of disc material or L4 nerve root encroachment.  L5-S1: Stable findings status post laminectomy and Ray cage fusion. There is stable mild residual osteophytic spurring without high-grade  foraminal stenosis.  IMPRESSION: 1. Interval development of a moderate-sized posterolateral disc extrusion on the left at L4-5. There is caudal migration of a free disc fragment causing left L5 nerve root encroachment. 2. Stable findings at L5-S1 status post laminectomy and Ray cage fusion. 3. Stable mild disc bulging at L3-4.   Electronically Signed By: Camie Patience M.D. On: 05/11/2014 14:24  MR Lumbar Spine W Wo Contrast  Narrative Clinical Data:  Patient with low back pain, previous history of 2 back surgeries. MRI LUMBAR SPINE WITHOUT AND WITH CONTRAST: Technique:  Multiplanar and multiecho pulse sequences of the lumbar spine, to include the lower thoracic region and upper sacral regions, were obtained according to standard protocol before and after administration of intravenous contrast. Contrast:  20 ml of Magnevist intravenously. There are no previous studies available for review at this time. Alignment in the sagittal plane is near anatomic.  There is disc desiccation and disc space narrowing at L4-5.  At L5-S1, there is suggestion of the presence of intervening cage  fusion  apparatus. Marrow signal is normal.  Vertebral body heights are intact.  The conus terminates at the level of T12 and L1 and is grossly on the images provided. The posterior longitudinal ligament is intact. L1-2:  No disc herniations or protrusions noted.  Mild facet prominence is noted bilaterally. L2-3:  No disc herniations or protrusions noted.  Mild facet and ligamentum flavum prominence contribute to minimal triangulation of the thecal sac. Nerve roots and neural foramina are normal. L3-4:  No disc herniations or protrusions noted.  Mild facet prominence is noted bilaterally with diastasis of the right facet joint. L4-5:  There is a central broad-based annular disc bulge with mild impression of the anterior thecal sac.  This has ligamentum flavum prominence contributing to triangulation of the thecal  sac. There is no compromise of the exiting nerve roots in the neural foramina. L5-S1:  Again demonstrated is the presence of intervening cage fusion apparatus with the post infusion images demonstrating enhancement in the anterior epidural space at L4-5,  L5-S1.  Two focal rounded signal  voids are seen projecting into the anterior epidural space at L5-S1 centrally and left paracentrally.  They cause minimal impression of the thecal sac left paracentrally. There is no contact with the exiting left L5 and the left S1 nerve roots noted however.  Impression 1. At L4-5, diffuse broad-based annular disc bulge without nerve root or neural foraminal compromise. 2. At L5-S1, previous positioning of cage fusion apparatus with post surgical enhancing scar tissue in the anterior and the left paracentral epidural space. 3.  Too small foci of hypointensity in these regions as described which may represent degenerative spondylosis vs blooming artifact related to the metal on the sequence. 4.  Incidental note of suggestion of left-sided post-surgical laminar defect. 5. Correlation with the previous studies, if available would be helpful.  Provider: Mauri Pole  CT Lumbar Spine W Contrast  Narrative FINDINGS CLINICAL DATA:  THE PATIENT HAS LOW BACK PAIN WITH LEFT RADICULAR SYMPTOMS.  SHE IS REFERRED FOR DIAGNOSTIC DISK INJECTION. DIAGNOSTIC DISK INJECTION: THE PROCEDURE WAS DISCUSSED WITH THE PATIENT, THE RISKS INVOLVED AND CONSENT WAS OBTAINED. DIAGNOSTIC DISK INJECTIONS PERFORMED AT THE L4-5 AND L5-S1 LEVEL UTILIZING A RIGHT POSTEROLATERAL APPROACH.  USING STERILE TECHNIQUE AND LIDOCAINE FOR LOCAL ANESTHESIA, I DIRECTED A CURVED 22 GAUGE CHIBA NEEDLE INTO THE CENTRAL POSTERIOR DISKS.  WE INJECTED CONTRAST UTILIZING PRESSURE MANOMETRY. FOLLOW-UP CT WAS PERFORMED. L4-5:  OPENING PRESSURE 20 PSI.  WE OBTAINED A PRESSURE OF 60.  AN ANNULAR TEAR WAS ELICITED AT A PRESSURE OF 34.  WE PROVOKED A PRESSURE  SENSATION WITHOUT REPRODUCTION OF HER TYPICAL LOW BACK PAIN. L5-S1:  OPENING PRESSURE 8 PSI.  AT A PRESSURE OF 31, WE ELICITED INTENSE PAIN WHICH SHE STATES IS IDENTICAL TO THE PAIN SHE HAS ON A REGULAR BASIS.  SHE SCORES THIS AS 10 OUT OF 10.  AN ANNULAR TEAR IS APPRECIATED. IMPRESSION 1.  PROVOCATIVE DISKOGRAPHY REPRODUCES THE PATIENT'S PAIN AT THE L5-S1 LEVEL AS NOTED.  AN ANNULAR TEAR IS SEEN. I SUSPECT THE DISK IS CHEMICALLY SENSITIVE. 2.  DIAGNOSTIC INJECTION AT L4-5 SHOWS AN ANNULAR TEAR.  THIS DOES NOT PROVOKE THE PATIENT'S SYMPTOMS. POST-DISKOGRAM CT: CT WAS  SUBSEQUENTLY PERFORMED THROUGH THE INJECTED LEVELS. L4-5:  A LEFT PARACENTRAL ANNULAR TEAR IS APPRECIATED.  MILD FACET OSTEOARTHRITIS.  THERE IS NO APPRECIABLE CENTRAL NOR FORAMINAL ENCROACHMENT. L5-S1:  A LARGE LEFT FORAMINAL HYPERDENSE FOCUS IS SEEN. I SUSPECT THIS REFLECTS CONTRAST EXTENDING INTO A FORAMINAL DISK PROTRUSION.  THERE MAY BE AN OSSEOUS COMPONENT, HOWEVER, THIS IS IS NOT APPRECIATED ON THE MR STUDY WHICH WAS PERFORMED 03/18/00.  THIS DOES MARKEDLY NARROW  THE LEFT L5 NEURAL FORAMEN.  THERE IS NO APPRECIABLE SUBARTICULAR LATERAL RECESS STENOSIS. IMPRESSION: 1.  CT DEMONSTRATES CONTRAST EXTENDING INTO THE LEFT FORAMEN THROUGH AN ANNULAR RENT, LIKELY INTO A FORAMINAL DISK.  THERE MAY BE AN OSSEOUS COMPONENT. 2.  L4-5:  A SMALL ANNULAR TEAR WITHOUT PROTRUSION.  DG Lumbar Spine 2-3 Views  Narrative FINDINGS CLINICAL:  LEFT LEG PAIN.  SURGERY 11/21/00 FOR LUMBAR SPONDYLOSIS. LUMBAR SPINE TWO VIEWS: NO COMPARISON.  RAY CAGE INTERBODY FUSION IS SEEN AT L5-S1 WITH SATISFACTORY POSITION AND ALIGNMENT.  VERTEBRAL ALIGNMENT IS NORMAL.  NO OTHER SIGNIFICANT ABNORMALITY IS SEEN. IMPRESSION 1.  NORMAL POST L5-S1 RAY CAGE FUSION. 2.  OTHERWISE NEGATIVE.  DG Lumbar Spine Complete  Narrative CLINICAL DATA:  Low back pain after mowing  EXAM: LUMBAR SPINE - COMPLETE 4+ VIEW  COMPARISON:  The  04/07/2013  FINDINGS: Osseous demineralization.  Five non-rib-bearing lumbar vertebrae.  BILATERAL prior Ray cage fusion of L5-S1.  Calcified iliolumbar ligaments.  Scattered disc space narrowing and minimal endplate spur formation lower lumbar spine.  Vertebral body heights maintained without fracture or subluxation.  BILATERAL spondylolysis L5.  SI joints symmetric.  IMPRESSION: Mild degenerative disc and facet disease changes lumbar spine.  Prior Ray cage fusion L5-S1.  No acute abnormalities or interval change.   Electronically Signed By: Ulyses Southward M.D. On: 03/23/2014 12:29  IR Diskography Lumbar  Narrative FINDINGS CLINICAL DATA:  THE PATIENT HAS LOW BACK PAIN WITH LEFT RADICULAR SYMPTOMS.  SHE IS REFERRED FOR DIAGNOSTIC DISK INJECTION. DIAGNOSTIC DISK INJECTION: THE PROCEDURE WAS DISCUSSED WITH THE PATIENT, THE RISKS INVOLVED AND CONSENT WAS OBTAINED. DIAGNOSTIC DISK INJECTIONS PERFORMED AT THE L4-5 AND L5-S1 LEVEL UTILIZING A RIGHT POSTEROLATERAL APPROACH.  USING STERILE TECHNIQUE AND LIDOCAINE FOR LOCAL ANESTHESIA, I DIRECTED A CURVED 22 GAUGE CHIBA NEEDLE INTO THE CENTRAL POSTERIOR DISKS.  WE INJECTED CONTRAST UTILIZING PRESSURE MANOMETRY. FOLLOW-UP CT WAS PERFORMED. L4-5:  OPENING PRESSURE 20 PSI.  WE OBTAINED A PRESSURE OF 60.  AN ANNULAR TEAR WAS ELICITED AT A PRESSURE OF 34.  WE PROVOKED A PRESSURE SENSATION WITHOUT REPRODUCTION OF HER TYPICAL LOW BACK PAIN. L5-S1:  OPENING PRESSURE 8 PSI.  AT A PRESSURE OF 31, WE ELICITED INTENSE PAIN WHICH SHE STATES IS IDENTICAL TO THE PAIN SHE HAS ON A REGULAR BASIS.  SHE SCORES THIS AS 10 OUT OF 10.  AN ANNULAR TEAR IS APPRECIATED. IMPRESSION 1.  PROVOCATIVE DISKOGRAPHY REPRODUCES THE PATIENT'S PAIN AT THE L5-S1 LEVEL AS NOTED.  AN ANNULAR TEAR IS SEEN. I SUSPECT THE DISK IS CHEMICALLY SENSITIVE. 2.  DIAGNOSTIC INJECTION AT L4-5 SHOWS AN ANNULAR TEAR.  THIS DOES NOT PROVOKE THE  PATIENT'S SYMPTOMS. POST-DISKOGRAM CT: CT WAS  SUBSEQUENTLY PERFORMED THROUGH THE INJECTED LEVELS. L4-5:  A LEFT PARACENTRAL ANNULAR TEAR IS APPRECIATED.  MILD FACET OSTEOARTHRITIS.  THERE IS NO APPRECIABLE CENTRAL NOR FORAMINAL ENCROACHMENT. L5-S1:  A LARGE LEFT FORAMINAL HYPERDENSE FOCUS IS SEEN. I SUSPECT THIS REFLECTS CONTRAST EXTENDING INTO A FORAMINAL DISK PROTRUSION.  THERE MAY BE AN OSSEOUS COMPONENT, HOWEVER, THIS IS IS NOT APPRECIATED ON THE MR STUDY WHICH WAS PERFORMED 03/18/00.  THIS DOES MARKEDLY NARROW  THE LEFT L5 NEURAL FORAMEN.  THERE IS NO APPRECIABLE SUBARTICULAR LATERAL RECESS STENOSIS. IMPRESSION: 1.  CT DEMONSTRATES CONTRAST EXTENDING INTO THE LEFT FORAMEN THROUGH AN ANNULAR RENT, LIKELY INTO A FORAMINAL DISK.  THERE MAY BE AN OSSEOUS COMPONENT. 2.  L4-5:  A SMALL ANNULAR TEAR WITHOUT PROTRUSION.  DG Epidurography  Narrative FINDINGS CLINICAL DATA:  THE PATIENT HAS LEFT RADICULAR PAIN. MR SHOWS RECURRENT DISK PROTRUSION AT THE L5- S1 LEVEL RESULTING IN AN S1 RADICULOPATHY. SHE IS REFERRED BY DR. Channing Mutters FOR EPIDURAL STEROID INJECTIONS. THE PROCEDURE WAS DISCUSSED WITH THE PATIENT, THE RISKS INVOLVED AND CONSENT OBTAINED. LEFT S1 NERVE ROOT INJECTION: I PLACED A 20 GAUGE SPINAL NEEDLE INTO THE LEFT SI NEURAL FORAMEN. DIAGNOSTIC INJECTION WITH OMNIPAQUE 180 CONTRAST SHOWED CONTRAST OUTLINING THE S1 ROOT WITH SELECTIVE CENTRAL EPIDURAL SPREAD. THERAPEUTIC INJECTION WAS PERFORMED WITH A 3 CC MIXTURE CONTAINING 80 MG DEPO-MEDROL, 1 CC 1% LIDOCAINE AND 1 CC .25% SENSORCAINE. THE INJECTION PROVOKES MILD RADICULAR SYMPTOMS. FOLLOWING THE PROCEDURE, SHE HAS PARTIAL RELIEF. SHE WAS IN GOOD CONDITION AT THE TIME OF HER DISCHARGE. IMPRESSION 1.  LEFT S1 NERVE ROOT INJECTION WAS TECHNICALLY SUCCESSFUL. 2.  THERAPEUTIC TRANSFORAMINAL INJECTION AT THE S1 LEVEL AS DESCRIBED. THE PATIENT WAS COMFORTABLE AT THE TIME  OF HER DISCHARGE.  IR Epidurography  Narrative FINDINGS CLINICAL  DATA:  THE PATIENT REPORTS NO IMPROVEMENT FOLLOWING WHAT APPEARS TO HAVE BEEN A WELL- PERFORMED LEFT S1 INJECTION.  SHE ACTUALLY SAYS HER PAIN IS WORSE. LUMBAR EPIDURAL INJECTION: I CHOSE A TRANSLAMINAR APPROACH TODAY SIMPLY TO TRY SOMETHING DIFFERENT. I APPROACHED FROM THE SUPERIOR LAMINA OF S1 ON THE LEFT USING A CRAWFORD EPIDURAL NEEDLE. THIS WAS ADVANCED USING LOSS OF RESISTANCE TECHNIQUE. DIAGNOSTIC EPIDURAL INJECTION: INJECTION OF OMNIPAQUE 180 SHOWS A GOOD EPIDURAL PATTERN WITH SPREAD ABOVE AND BELOW THE LEVEL OF NEEDLE PLACEMENT, PRIMARILY ON THE LEFT. TEST INJECTION OF 3 CC OF 1% LIDOCAINE SHOWED NO ADVERSE AFFECT. THERAPEUTIC EPIDURAL INJECTION: 120 MG DEPO-MEDROL MIXED WITH 3 CC OF 1% LIDOCAINE WERE INSTILLED. THE PROCEDURE WAS REASONABLY WELL TOLERATED, ALTHOUGH AGAIN SHE HAS A LOT OF PAIN BOTH WITH THE INJECTION AND FOLLOWING THE INJECTION. SHE IS GETTING NO APPRECIABLE BENEFIT FROM THE ANESTHETIC. THE EXPECTED TIME COURSES OF THE STEROID AND ANESTHETIC WAS DISCUSSED, AND THE PATIENT IS TO FOLLOW-UP WITH DR ROY. IMPRESSION TECHNICALLY SUCCESSFUL SECOND INJECTION CONSISTING OF A TRANSLAMINAR EPIDURAL ON THE LEFT AT L5-S1. SHE CERTAINLY EXPERIENCES A LOT MORE PAIN THAN WE NORMALLY SEE AND DOES NOT HAVE ANY EARLY RELIEF.  Ankle Imaging: DG Ankle Complete Left  Narrative CLINICAL DATA:  Left foot and ankle pain ; history of blood clot in the left leg in June 2014  EXAM: LEFT ANKLE COMPLETE - 3+ VIEW  COMPARISON:  Left foot series of today's date  FINDINGS: The ankle joint mortise is preserved. The talar dome is intact. There is no acute or old malleolar fracture. The talus and calcaneus exhibit no acute abnormalities. There is very mild degenerative change of the talonavicular articulation. The soft tissues of the malleolar regions are unremarkable.  IMPRESSION: There is no acute or significant chronic bony abnormality of the left ankle.   Electronically  Signed By: David  [place] M.D. On: 12/28/2015 14:28  Foot Imaging: DG Foot Complete Left  Narrative CLINICAL DATA:  Patient had blood clot in l leg June 2014 whit pain foot and ankle associated, rt arm numbness and posterior neck pain, no injury  EXAM: LEFT FOOT - COMPLETE 3+ VIEW  COMPARISON:  None.  FINDINGS: No fracture.  No dislocation.  There are postsurgical changes. The medial margin of first metatarsal head appears to have been surgical flat and consistent with bunion surgery. There is a single wire along medial distal aspect of the great toe proximal phalanx.  Joints are normally spaced and aligned. There are no significant arthropathic changes.  Soft tissues are unremarkable.  IMPRESSION: 1. No fracture or acute finding.  No significant joint abnormality.   Electronically Signed By: Amie [place] M.D. On: 12/28/2015 14:28  Complexity Note: Imaging results reviewed. Results shared with Jane [name], using Layman's terms.                        ROS  Cardiovascular: Daily Aspirin intake, High blood pressure and Blood thinners:  Antiplatelet Pulmonary or Respiratory: Snoring  and Temporary stoppage of breathing during sleep Neurological: No reported neurological signs or symptoms such as seizures, abnormal skin sensations, urinary and/or fecal incontinence, being born with an abnormal open spine and/or a tethered spinal cord Psychological-Psychiatric: Psychiatric disorder, Anxiousness, Depressed, Prone to panicking and Difficulty sleeping and or falling asleep Gastrointestinal: Irregular, infrequent bowel movements (Constipation) Genitourinary: Kidney disease Hematological: Brusing easily, Bleeding easily and Low platelet levels (Thrombocytopenia) Endocrine: High blood  sugar controlled without the use of insulin (NIDDM) and Slow thyroid Rheumatologic: No reported rheumatological signs and symptoms such as fatigue, joint pain, tenderness, swelling, redness, heat,  stiffness, decreased range of motion, with or without associated rash Musculoskeletal: Negative for myasthenia gravis, muscular dystrophy, multiple sclerosis or malignant hyperthermia Work History: Disabled  Allergies  Jane [name] is allergic to aripiprazole, seroquel [quetiapine fumarate], chlorpromazine, and gabapentin.  Laboratory Chemistry Profile   Renal Lab Results  Component Value Date   BUN 44 (H) 04/13/2020   CREATININE 1.24 (H) 04/13/2020   BCR 18 03/29/2020   GFRAA 57 (L) 04/13/2020   GFRNONAA 50 (L) 04/13/2020   PROTEINUR 30 (A) 03/26/2019     Electrolytes Lab Results  Component Value Date   NA 142 04/13/2020   K 4.3 04/13/2020   CL 107 04/13/2020   CALCIUM 9.7 04/13/2020   MG 2.2 04/04/2019   PHOS 4.0 04/04/2019     Hepatic Lab Results  Component Value Date   AST 13 (L) 04/13/2020   ALT 17 04/13/2020   ALBUMIN 4.3 04/13/2020   ALKPHOS 86 04/13/2020     ID Lab Results  Component Value Date   HIV Non Reactive 04/28/2019   SARSCOV2NAA NEGATIVE 11/18/2019   STAPHAUREUS NEGATIVE 03/26/2019   MRSAPCR NEGATIVE 03/26/2019     Bone Lab Results  Component Value Date   VD25OH 38.0 03/29/2020     Endocrine Lab Results  Component Value Date   GLUCOSE 118 (H) 04/13/2020   GLUCOSEU NEGATIVE 03/26/2019   HGBA1C 6.2 (H) 03/29/2020   TSH 0.275 (L) 03/29/2020     Neuropathy Lab Results  Component Value Date   VITAMINB12 476 04/13/2020   FOLATE 34.6 04/13/2020   HGBA1C 6.2 (H) 03/29/2020   HIV Non Reactive 04/28/2019     CNS No results found for: COLORCSF, APPEARCSF, RBCCOUNTCSF, WBCCSF, POLYSCSF, LYMPHSCSF, EOSCSF, PROTEINCSF, GLUCCSF, JCVIRUS, CSFOLI, IGGCSF, LABACHR, ACETBL, LABACHR, ACETBL   Inflammation (CRP: Acute  ESR: Chronic) Lab Results  Component Value Date   CRP 11 (H) 02/16/2020   ESRSEDRATE 33 02/16/2020   LATICACIDVEN 1.2 01/05/2020     Rheumatology Lab Results  Component Value Date   RF <10.0 02/16/2020   ANA Negative  02/16/2020     Coagulation Lab Results  Component Value Date   INR 3.5 (A) 04/11/2020   LABPROT 26.5 (H) 10/25/2019   APTT 60 (H) 08/18/2019   PLT 149 (L) 04/13/2020     Cardiovascular Lab Results  Component Value Date   HGB 13.1 04/13/2020   HCT 40.8 04/13/2020     Screening Lab Results  Component Value Date   SARSCOV2NAA NEGATIVE 11/18/2019   COVIDSOURCE NASOPHARYNGEAL 08/13/2019   STAPHAUREUS NEGATIVE 03/26/2019   MRSAPCR NEGATIVE 03/26/2019   HIV Non Reactive 04/28/2019     Cancer No results found for: CEA, CA125, LABCA2   Allergens No results found for: ALMOND, APPLE, ASPARAGUS, AVOCADO, BANANA, BARLEY, BASIL, BAYLEAF, GREENBEAN, LIMABEAN, WHITEBEAN, BEEFIGE, REDBEET, BLUEBERRY, BROCCOLI, CABBAGE, MELON, CARROT, CASEIN, CASHEWNUT, CAULIFLOWER, CELERY     Note: Lab results reviewed.   [place]  Drug: Jane [name]  reports no history of drug use. Alcohol:  reports no history of alcohol use. Tobacco:  reports that she has never smoked. She has never used smokeless tobacco. Medical:  has a past medical history of ADD (attention deficit disorder), Anxiety, Back pain, Bilateral swelling of feet, Bipolar 1 disorder (HCC), Bipolar 1 disorder ([place]), Bipolar disorder ([place]), Chewing difficulty, Chronic fatigue syndrome, Chronic kidney disease, Constipation, Depression, Diabetes  mellitus without complication ([place]), Dyspnea, GERD (gastroesophageal reflux disease), Headache, High cholesterol, History of blood clots, History of DVT (deep vein thrombosis), Hypertension, Hypothyroidism, Joint pain, Neuropathy, Obsessive-compulsive disorder, Peripheral vascular disease ([place]), Prediabetes, Respiratory failure requiring intubation ([place]), Restless leg syndrome, Shortness of breath, Sleep apnea, Swallowing difficulty, Thrombocytopenia ([place]) (09/15/2019), Trigger thumb of left hand (01/2018), and Trigger thumb of right hand. Family: family history includes ADD / ADHD in her daughter; Anxiety  disorder in her daughter; Bipolar disorder in her daughter, maternal aunt, and mother; Depression in her mother; Diabetes in her father; Drug abuse in her daughter and daughter; Heart disease in her brother, father, and mother; Hyperlipidemia in her brother, father, and mother; Hypertension in her brother, father, mother, and sister; Obesity in her father and mother; Sleep apnea in her father and mother; Stroke in her mother; Suicidality in her maternal aunt.  Past Surgical History:  Procedure Laterality Date  . ABDOMINAL HYSTERECTOMY  06/2016   complete  . APPLICATION OF WOUND VAC Left 04/27/2019   Procedure: APPLICATION OF WOUND VAC LEFT GROIN;  Surgeon: Waynetta Sandy, MD;  Location: [facility];  Service: Vascular;  Laterality: Left;  . AV FISTULA PLACEMENT Left 11/24/2018   Procedure: ARTERIOVENOUS (AV) FISTULA CREATION LEFT SFA TO LEFT FEMORAL VEIN;  Surgeon: Waynetta Sandy, MD;  Location: [place];  Service: Vascular;  Laterality: Left;  . BACK SURGERY    . CHOLECYSTECTOMY    . COLONOSCOPY WITH PROPOFOL N/A 11/22/2019   Procedure: COLONOSCOPY WITH PROPOFOL;  Surgeon: [name], MD;  Location[facility] ENDOSCOPY;  Service: Gastroenterology;  Laterality: N/A;  . FEMORAL ARTERY EXPLORATION  03/30/2019   Procedure: Left Common Femoral Artery and Vein Exploration;  Surgeon: Waynetta Sandy, MD;  Location: [facility];  Service: Vascular;;  . FEMORAL-FEMORAL BYPASS GRAFT Left 11/24/2018   Procedure: BYPASS GRAFT FEMORAL-FEMORAL VENOUS LEFT TO RIGHT PALMA PROCEDURE USING CRYOVEIN;  Surgeon: Waynetta Sandy, MD;  Location: [place];  Service: Vascular;  Laterality: Left;  . GROIN DEBRIDEMENT Left 04/27/2019   Procedure: GROIN DEBRIDEMENT;  Surgeon: Waynetta Sandy, MD;  Location: [facility] [place];  Service: Vascular;  Laterality: Left;  . INSERTION OF ILIAC STENT  03/30/2019   Procedure: Stent of left common, external iliac veins and left common femoral vein;  Surgeon: Waynetta Sandy, MD;  Location: [place];  Service: Vascular;;  . LOWER EXTREMITY VENOGRAPHY N/A 08/17/2018   Procedure: LOWER EXTREMITY VENOGRAPHY - Central Venogram;  Surgeon: Waynetta Sandy, MD;  Location: [place] CV [facility];  Service: Cardiovascular;  Laterality: N/A;  . LOWER EXTREMITY VENOGRAPHY Bilateral 03/09/2019   Procedure: LOWER EXTREMITY VENOGRAPHY;  Surgeon: Waynetta Sandy, MD;  Location: [place] [facility];  Service: Cardiovascular;  Laterality: Bilateral;  . LOWER EXTREMITY VENOGRAPHY Left 08/16/2019   Procedure: LOWER EXTREMITY VENOGRAPHY;  Surgeon: Waynetta Sandy, MD;  Location: [place] [facility];  Service: Cardiovascular;  Laterality: Left;  . LUMBAR FUSION  11/21/2000   L5-S1  . LUMBAR SPINE SURGERY     x 2 others  . PATCH ANGIOPLASTY Left 03/30/2019   Procedure: Patch Angioplasty of the Left Common Femoral Vein using Venosure Biologic patch;  Surgeon: Waynetta Sandy, MD;  Location: [place];  Service: Vascular;  Laterality: Left;  . PERIPHERAL VASCULAR INTERVENTION Left 08/16/2019   Procedure: PERIPHERAL VASCULAR INTERVENTION;  Surgeon: Waynetta Sandy, MD;  Location: [place] [facility];  Service: Cardiovascular;  Laterality: Left;  common femoral/femoral vein stent  . TRIGGER FINGER RELEASE  Right 12/01/2017   Procedure: RELEASE TRIGGER FINGER/A-1 PULLEY RIGHT THUMB;  Surgeon: Leanora Cover, MD;  Location: [facility];  Service: Orthopedics;  Laterality: Right;  . TRIGGER FINGER RELEASE Left 01/26/2018   Procedure: LEFT TRIGGER THUMB RELEASE;  Surgeon: Leanora Cover, MD;  Location: [place];  Service: Orthopedics;  Laterality: Left;  . ULTRASOUND GUIDANCE FOR VASCULAR ACCESS Right 03/30/2019   Procedure: Ultrasound-guided cannulation right internal jugular vein;  Surgeon: Waynetta Sandy, MD;  Location: [place];  Service: Vascular;  Laterality: Right;   Active Ambulatory Problems    Diagnosis Date  Noted  . History of recurrent deep vein thrombosis (DVT) 08/18/2012  . 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 Bipolar 1 disorder ([place]) 09/25/2012  . OCD (obsessive compulsive disorder) 11/20/2012  . Depression, major, recurrent ([place]) 06/17/2013  . Radicular low back pain 05/25/2014  . 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 Chronic venous insufficiency 11/21/2017  . 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 Wound of left groin 04/27/2019  . Normocytic anemia 09/15/2019  . Hypothyroidism   . Morbid obesity ([place]) 10/25/2019  . History of back surgery 10/25/2019  . Peripheral neuropathy 10/25/2019  . Restless leg syndrome 10/25/2019  . Chronic low back pain (Bilateral) w/ sciatica (Bilateral) 10/25/2019  . Essential hypertension 10/25/2019  . Type 2 diabetes mellitus with stage 3 chronic kidney disease, without long-term current use of insulin ([place]) 10/25/2019  . Chronic anticoagulation (Coumadin) 10/25/2019  . Hyperlipidemia associated with type 2 diabetes mellitus ([place]) 10/25/2019  . CKD stage 3 due to type 2 diabetes mellitus ([place]) 10/25/2019  . GERD (gastroesophageal reflux disease) 10/25/2019  . Allergic rhinitis 10/25/2019  . Chronic constipation 10/25/2019  . Displacement of lumbar intervertebral disc without myelopathy 07/14/2017  . Thrush 01/20/2020  . Bilateral hand swelling 02/16/2020  . Myalgia 03/15/2020  . Other proteinuria 03/30/2020  . Chronic pain syndrome 04/24/2020  . Pharmacologic therapy 04/24/2020  . Disorder of skeletal system 04/24/2020  . Problems influencing health status 04/24/2020  . History of thrombocytopenia 04/24/2020  . Abnormal MRI, cervical spine (01/08/2017) 04/24/2020  . Abnormal MRI, lumbar spine (05/11/2014) 04/24/2020  . DDD (degenerative disc disease), cervical 04/24/2020  . DDD (degenerative disc disease), lumbar 04/24/2020  . 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 Diabetic peripheral neuropathy ([place]) 04/24/2020  . Neurogenic pain  04/24/2020  . Chronic musculoskeletal pain 04/24/2020   Resolved Ambulatory Problems    Diagnosis Date Noted  . Dvt femoral (deep venous thrombosis) ([place]) 03/30/2019  . Respiratory failure requiring intubation ([place])   . Pressure injury of skin 04/02/2019  . Thrombocytopenia ([place]) 09/15/2019   Past Medical History:  Diagnosis Date  . ADD (attention deficit disorder)   . Anxiety   . Back pain   . Bilateral swelling of feet   . Bipolar disorder ([place])   . Chewing difficulty   . Chronic fatigue syndrome   . Chronic kidney disease   . Constipation   . Depression   . Diabetes mellitus without complication ([place])   . Dyspnea   . Headache   . High cholesterol   . History of blood clots   . History of DVT (deep vein thrombosis)   . Hypertension   . Joint pain   . Neuropathy   . Obsessive-compulsive disorder   . Peripheral vascular disease ([place])   . Prediabetes   . Shortness of breath   . Sleep apnea   . Swallowing difficulty   . Trigger thumb of left  hand 01/2018  . Trigger thumb of right hand    Constitutional Exam  General appearance: Well nourished, well developed, and well hydrated. In no apparent acute distress Vitals:   04/24/20 1137  BP: (!) 146/82  Pulse: 82  Resp: 18  Temp: 98.4 F (36.9 C)  SpO2: 98%  Weight: 275 lb (124.7 kg)  Height: 5\' 8"  (1.727 m)   BMI Assessment: Estimated body mass index is 41.81 kg/m as calculated from the following:   Height as of this encounter: 5\' 8"  (1.727 m).   Weight as of this encounter: 275 lb (124.7 kg).  BMI interpretation table: BMI level Category Range association with higher incidence of chronic pain  <18 kg/m2 Underweight   18.5-24.9 kg/m2 Ideal body weight   25-29.9 kg/m2 Overweight Increased incidence by 20%  30-34.9 kg/m2 Obese (Class I) Increased incidence by 68%  35-39.9 kg/m2 Severe obesity (Class II) Increased incidence by 136%  >40 kg/m2 Extreme obesity (Class III) Increased incidence by 254%   Patient's  current BMI Ideal Body weight  Body mass index is 41.81 kg/m. Ideal body weight: 63.9 kg (140 lb 14 oz) Adjusted ideal body weight: 88.2 kg (194 lb 8.4 oz)   BMI Readings from Last 4 Encounters:  04/24/20 41.81 kg/m  04/19/20 41.78 kg/m  04/12/20 41.81 kg/m  04/11/20 43.03 kg/m   Wt Readings from Last 4 Encounters:  04/24/20 275 lb (124.7 kg)  04/19/20 274 lb 12.8 oz (124.6 kg)  04/12/20 275 lb (124.7 kg)  04/11/20 283 lb (128.4 kg)    Psych/Mental status: Alert, oriented x 3 (person, place, & time)       Eyes: PERLA Respiratory: No evidence of acute respiratory distress  Cervical Spine Exam  Skin & Axial Inspection: No masses, redness, edema, swelling, or associated skin lesions Alignment: Symmetrical Functional ROM: Unrestricted ROM      Stability: No instability detected Muscle Tone/Strength: Functionally intact. No obvious neuro-muscular anomalies detected. Sensory (Neurological): Unimpaired Palpation: No palpable anomalies              Upper Extremity (UE) Exam    Side: Right upper extremity  Side: Left upper extremity  Skin & Extremity Inspection: Skin color, temperature, and hair growth are WNL. No peripheral edema or cyanosis. No masses, redness, swelling, asymmetry, or associated skin lesions. No contractures.  Skin & Extremity Inspection: Skin color, temperature, and hair growth are WNL. No peripheral edema or cyanosis. No masses, redness, swelling, asymmetry, or associated skin lesions. No contractures.  Functional ROM: Unrestricted ROM          Functional ROM: Unrestricted ROM          Muscle Tone/Strength: Functionally intact. No obvious neuro-muscular anomalies detected.  Muscle Tone/Strength: Functionally intact. No obvious neuro-muscular anomalies detected.  Sensory (Neurological): Unimpaired          Sensory (Neurological): Unimpaired          Palpation: No palpable anomalies              Palpation: No palpable anomalies              Provocative Test(s):   Phalen's test: deferred Tinel's test: deferred Apley's scratch test (touch opposite shoulder):  Action 1 (Across chest): deferred Action 2 (Overhead): deferred Action 3 (LB reach): deferred   Provocative Test(s):  Phalen's test: deferred Tinel's test: deferred Apley's scratch test (touch opposite shoulder):  Action 1 (Across chest): deferred Action 2 (Overhead): deferred Action 3 (LB reach): deferred    Thoracic Spine  Area Exam  Skin & Axial Inspection: No masses, redness, or swelling Alignment: Symmetrical Functional ROM: Unrestricted ROM Stability: No instability detected Muscle Tone/Strength: Functionally intact. No obvious neuro-muscular anomalies detected. Sensory (Neurological): Unimpaired Muscle strength & Tone: No palpable anomalies  Lumbar Exam  Skin & Axial Inspection: Well healed scar from previous spine surgery detected Alignment: Symmetrical Functional ROM: Minimal ROM affecting both sides Stability: No instability detected Muscle Tone/Strength: Increased muscle tone over affected area Sensory (Neurological): Movement-associated pain Palpation: Complains of area being tender to palpation       Provocative Tests: Hyperextension/rotation test: (+) bilaterally for facet joint pain. (L>R) Lumbar quadrant test (Kemp's test): (+) bilaterally for facet joint pain. (L>R) Lateral bending test: (-) No radicular symptoms.  No apparent foraminal impingement. Patrick's Maneuver: (-) The patient had a lot of difficulty trying to complete this maneuver since she was unable to bring her ankle to rest above or on her knee.             FABER* test: Non-diagnostic                   S-I anterior distraction/compression test: deferred today         S-I lateral compression test: deferred today         S-I Thigh-thrust test: deferred today         S-I Gaenslen's test: deferred today         *(Flexion, ABduction and External Rotation)  Gait & Posture Assessment  Ambulation:  Limited Gait: Antalgic gait (limping) Posture: Difficulty standing up straight, due to pain   Lower Extremity Exam    Side: Right lower extremity  Side: Left lower extremity  Stability: No instability observed          Stability: No instability observed          Skin & Extremity Inspection: Skin color, temperature, and hair growth are WNL. No peripheral edema or cyanosis. No masses, redness, swelling, asymmetry, or associated skin lesions. No contractures.  Skin & Extremity Inspection: Skin color, temperature, and hair growth are WNL. No peripheral edema or cyanosis. No masses, redness, swelling, asymmetry, or associated skin lesions. No contractures.  Functional ROM: Decreased ROM for hip and knee joints Limited SLR (straight leg raise).  No radicular pain  Functional ROM: Decreased ROM for hip and knee joints Limited SLR (straight leg raise)  Muscle Tone/Strength: Functionally intact. No obvious neuro-muscular anomalies detected..  No radicular pain.  Muscle Tone/Strength: Functionally intact. No obvious neuro-muscular anomalies detected.  No radicular pain.  Sensory (Neurological): Movement-associated discomfort        Sensory (Neurological): Movement-associated discomfort        DTR: Patellar: 0: absent Achilles: 0: absent Plantar: deferred today  DTR: Patellar: 0: absent Achilles: 0: absent Plantar: deferred today  Palpation: No palpable anomalies  Palpation: No palpable anomalies   Assessment  Primary Diagnosis & Pertinent Problem List: The primary encounter diagnosis was Chronic pain syndrome. Diagnoses of Abnormal MRI, cervical spine (01/08/2017), Abnormal MRI, lumbar spine (05/11/2014), DDD (degenerative disc disease), cervical, DDD (degenerative disc disease), lumbar, Failed back surgical syndrome, Diabetic peripheral neuropathy (HCC), Neurogenic pain, Chronic musculoskeletal pain, May-Thurner syndrome, Pharmacologic therapy, Chronic anticoagulation (Coumadin), Disorder of skeletal  system, Problems influencing health status, and History of thrombocytopenia were also pertinent to this visit.  Visit Diagnosis (New problems to examiner): 1. Chronic pain syndrome   2. Abnormal MRI, cervical spine (01/08/2017)   3. Abnormal MRI, lumbar spine (05/11/2014)   4.  DDD (degenerative disc disease), cervical   5. DDD (degenerative disc disease), lumbar   6. Failed back surgical syndrome   7. Diabetic peripheral neuropathy ([place])   8. Neurogenic pain   9. Chronic musculoskeletal pain   10. May-Thurner syndrome   11. Pharmacologic therapy   12. Chronic anticoagulation (Coumadin)   13. Disorder of skeletal system   14. Problems influencing health status   15. History of thrombocytopenia    Plan of Care (Initial workup plan)  Note: Jane [name] was reminded that as per protocol, today's visit has been an evaluation only. We have not taken over the patient's controlled substance management.  The patient is concerned about having to stop her blood thinners for any further interventional therapies due to her May-Thurner Syndrome.  Because I am currently not taking any patients for medication management only, I have consulted Dr. Gillis Santa, Director of our program and he has agreed to take her as a patient.  She will be returning to him in 3 weeks for her follow-up evaluation and start of medication management.  Problem-specific plan: No problem-specific Assessment & Plan notes found for this encounter.   Lab Orders     Compliance Drug Analysis, Ur Imaging Orders  No imaging studies ordered today   Referral Orders  No referral(s) requested today   Procedure Orders    No procedure(s) ordered today   Pharmacotherapy (current): Medications ordered:  No orders of the defined types were placed in this encounter.  Medications administered during this visit: Jane [name] had no medications administered during this visit.   Pharmacological management options:  Opioid  Analgesics: The patient was informed that there is no guarantee that she would be a candidate for opioid analgesics. The decision will be made following CDC guidelines. This decision will be based on the results of diagnostic studies, as well as Ms. Fiorella's risk profile.   Membrane stabilizer: To be determined at a later time  Muscle relaxant: To be determined at a later time  NSAID: To be determined at a later time  Other analgesic(s): To be determined at a later time   Interventional management options: Ms. Saulnier was informed that there is no guarantee that she would be a candidate for interventional therapies. The decision will be based on the results of diagnostic studies, as well as Ms. Setters's risk profile.  Procedure(s) under consideration:  Note: The patient indicates that she has had multiple back surgeries and she would like to stay away from any further surgeries.  In addition, she has had some interventional therapies by Dr. Rhoderick Moody. Diagnostic left-sided lumbar facet block  Possible left-sided lumbar facet RFA  Possible candidate for implant therapy    Provider-requested follow-up: Return in about 3 weeks (around 05/15/2020) for (F2F), (MM) from now on with Dr. Gillis Santa.  Future Appointments  Date Time Provider [place]  04/25/2020 11:00 AM [facility] NURSE [facility] PEC  04/26/2020  2:00 PM [facility] CHAIR 1 [facility] None  05/02/2020 11:40 AM Eber Jones, MD [facility] None  05/03/2020  2:00 PM [facility] CHAIR 1 [facility] None  05/16/2020 10:15 AM Gillis Santa, MD [facility] None  05/18/2020 11:00 AM Cloria Spring, MD [facility] None  07/19/2020  1:10 PM [facility] LAB [facility] None  07/20/2020  2:00 PM Lockamy, Theresia Lo, NP-C [facility] None    Note by: Gaspar Cola, MD Date: 04/24/2020; Time: 1:32 PM

## 2020-04-24 ENCOUNTER — Ambulatory Visit: Payer: Medicaid Other | Attending: Pain Medicine | Admitting: Pain Medicine

## 2020-04-24 ENCOUNTER — Encounter: Payer: Self-pay | Admitting: Family Medicine

## 2020-04-24 ENCOUNTER — Other Ambulatory Visit: Payer: Self-pay

## 2020-04-24 ENCOUNTER — Other Ambulatory Visit (HOSPITAL_COMMUNITY): Payer: Self-pay | Admitting: Psychiatry

## 2020-04-24 ENCOUNTER — Ambulatory Visit (INDEPENDENT_AMBULATORY_CARE_PROVIDER_SITE_OTHER): Payer: Medicaid Other | Admitting: Family Medicine

## 2020-04-24 ENCOUNTER — Encounter: Payer: Self-pay | Admitting: Pain Medicine

## 2020-04-24 VITALS — BP 146/82 | HR 82 | Temp 98.4°F | Resp 18 | Ht 68.0 in | Wt 275.0 lb

## 2020-04-24 VITALS — BP 110/68 | HR 92 | Temp 96.9°F | Resp 16 | Ht 68.0 in | Wt 274.0 lb

## 2020-04-24 DIAGNOSIS — E1121 Type 2 diabetes mellitus with diabetic nephropathy: Secondary | ICD-10-CM

## 2020-04-24 DIAGNOSIS — I871 Compression of vein: Secondary | ICD-10-CM | POA: Diagnosis not present

## 2020-04-24 DIAGNOSIS — Z7901 Long term (current) use of anticoagulants: Secondary | ICD-10-CM | POA: Diagnosis not present

## 2020-04-24 DIAGNOSIS — G8929 Other chronic pain: Secondary | ICD-10-CM | POA: Diagnosis not present

## 2020-04-24 DIAGNOSIS — M51369 Other intervertebral disc degeneration, lumbar region without mention of lumbar back pain or lower extremity pain: Secondary | ICD-10-CM | POA: Insufficient documentation

## 2020-04-24 DIAGNOSIS — M899 Disorder of bone, unspecified: Secondary | ICD-10-CM | POA: Diagnosis not present

## 2020-04-24 DIAGNOSIS — G894 Chronic pain syndrome: Secondary | ICD-10-CM | POA: Insufficient documentation

## 2020-04-24 DIAGNOSIS — Z79899 Other long term (current) drug therapy: Secondary | ICD-10-CM | POA: Insufficient documentation

## 2020-04-24 DIAGNOSIS — E039 Hypothyroidism, unspecified: Secondary | ICD-10-CM

## 2020-04-24 DIAGNOSIS — E1169 Type 2 diabetes mellitus with other specified complication: Secondary | ICD-10-CM

## 2020-04-24 DIAGNOSIS — M961 Postlaminectomy syndrome, not elsewhere classified: Secondary | ICD-10-CM | POA: Diagnosis not present

## 2020-04-24 DIAGNOSIS — M792 Neuralgia and neuritis, unspecified: Secondary | ICD-10-CM | POA: Diagnosis not present

## 2020-04-24 DIAGNOSIS — N1831 Chronic kidney disease, stage 3a: Secondary | ICD-10-CM

## 2020-04-24 DIAGNOSIS — Z862 Personal history of diseases of the blood and blood-forming organs and certain disorders involving the immune mechanism: Secondary | ICD-10-CM | POA: Diagnosis not present

## 2020-04-24 DIAGNOSIS — I10 Essential (primary) hypertension: Secondary | ICD-10-CM

## 2020-04-24 DIAGNOSIS — E1122 Type 2 diabetes mellitus with diabetic chronic kidney disease: Secondary | ICD-10-CM

## 2020-04-24 DIAGNOSIS — Z789 Other specified health status: Secondary | ICD-10-CM | POA: Insufficient documentation

## 2020-04-24 DIAGNOSIS — M7918 Myalgia, other site: Secondary | ICD-10-CM | POA: Insufficient documentation

## 2020-04-24 DIAGNOSIS — Z86718 Personal history of other venous thrombosis and embolism: Secondary | ICD-10-CM | POA: Diagnosis not present

## 2020-04-24 DIAGNOSIS — M5136 Other intervertebral disc degeneration, lumbar region: Secondary | ICD-10-CM | POA: Diagnosis not present

## 2020-04-24 DIAGNOSIS — E1142 Type 2 diabetes mellitus with diabetic polyneuropathy: Secondary | ICD-10-CM | POA: Diagnosis not present

## 2020-04-24 DIAGNOSIS — R937 Abnormal findings on diagnostic imaging of other parts of musculoskeletal system: Secondary | ICD-10-CM | POA: Diagnosis not present

## 2020-04-24 DIAGNOSIS — E785 Hyperlipidemia, unspecified: Secondary | ICD-10-CM

## 2020-04-24 DIAGNOSIS — M5030 Other cervical disc degeneration, unspecified cervical region: Secondary | ICD-10-CM | POA: Diagnosis not present

## 2020-04-24 LAB — POCT INR
INR: 1.7 — AB (ref 2.0–3.0)
PT: 19.8

## 2020-04-24 MED ORDER — AMPHETAMINE-DEXTROAMPHETAMINE 30 MG PO TABS: 30.0000 mg | ORAL_TABLET | Freq: Two times a day (BID) | ORAL | 0 refills | Status: DC

## 2020-04-24 NOTE — Patient Instructions (Addendum)
______________________________________________________________________________________________  Weight Management Required  URGENT: Your weight has been found to be adversely affecting your health.  Dear Jane [name]:  Your current Estimated body mass index is 41.81 kg/m as calculated from the following:   Height as of this encounter: '5\' 8"'$  (1.727 m).   Weight as of this encounter: 275 lb (124.7 kg).  Please use the table below to identify your weight category and associated incidence of chronic pain, secondary to your weight.  Body Mass Index (BMI) Classification BMI level (kg/m2) Category Associated incidence of chronic pain  <18  Underweight   18.5-24.9 Ideal body weight   25-29.9 Overweight  20%  30-34.9 Obese (Class I)  68%  35-39.9 Severe obesity (Class II)  136%  >40 Extreme obesity (Class III)  254%   In addition: You will be considered "Morbidly Obese", if your BMI is above 30 and you have one or more of the following conditions which are known to be caused and/or directly associated with obesity: 1.    Type 2 Diabetes (Which in turn can lead to cardiovascular diseases (CVD), stroke, peripheral vascular diseases (PVD), retinopathy, nephropathy, and neuropathy) 2.    Cardiovascular Disease (High Blood Pressure; Congestive Heart Failure; High Cholesterol; Coronary Artery Disease; Angina; or History of Heart Attacks) 3.    Breathing problems (Asthma; obesity-hypoventilation syndrome; obstructive sleep apnea; chronic inflammatory airway disease; reactive airway disease; or shortness of breath) 4.    Chronic kidney disease 5.    Liver disease (nonalcoholic fatty liver disease) 6.    High blood pressure 7.    Acid reflux (gastroesophageal reflux disease; heartburn) 8.    Osteoarthritis (OA) (with any of the following: hip pain; knee pain; and/or low back pain) 9.    Low back pain (Lumbar Facet Syndrome; and/or Degenerative Disc Disease) 10.  Hip pain (Osteoarthritis of hip)  (For every 1 lbs of added body weight, there is a 2 lbs increase in pressure inside of each hip articulation. 1:2 mechanical relationship) 11.  Knee pain (Osteoarthritis of knee) (For every 1 lbs of added body weight, there is a 4 lbs increase in pressure inside of each knee articulation. 1:4 mechanical relationship) (patients with a BMI>30 kg/m2 were 6.8 times more likely to develop knee OA than normal-weight individuals) 12.  Cancer: Epidemiological studies have shown that obesity is a risk factor for: post-menopausal breast cancer; cancers of the endometrium, colon and kidney cancer; malignant adenomas of the oesophagus. Obese subjects have an approximately 1.5-3.5-fold increased risk of developing these cancers compared with normal-weight subjects, and it has been estimated that between 15 and 45% of these cancers can be attributed to overweight. More recent studies suggest that obesity may also increase the risk of other types of cancer, including pancreatic, hepatic and gallbladder cancer. (Ref: Obesity and cancer. Pischon T, Nthlings U, [employer] H. Proc Nutr Soc. 2008 May;67(2):128-45. doi: 83.1517/O1607371062694854.) The International Agency for Research on Cancer (IARC) has identified 13 cancers associated with overweight and obesity: meningioma, multiple myeloma, adenocarcinoma of the esophagus, and cancers of the thyroid, postmenopausal breast cancer, gallbladder, stomach, liver, pancreas, kidney, ovaries, uterus, colon and rectal (colorectal) cancers. 12 percent of all cancers diagnosed in women and 24 percent of those diagnosed in men are associated with overweight and obesity.  Recommendation: At this point it is urgent that you take a step back and concentrate in loosing weight. Dedicate 100% of your efforts on this task. Nothing else will improve your health more than bringing your weight down and your BMI  to less than 30. If you are here, you probably have chronic pain. Because most chronic pain  patients have difficulty exercising secondary to their pain, you must rely on proper nutrition and diet in order to lose the weight. If your BMI is above 40, you should seriously consider bariatric surgery. A realistic goal is to lose 10% of your body weight over a period of 12 months.  Be honest to yourself, if over time you have unsuccessfully tried to lose weight, then it is time for you to seek professional help and to enter a medically supervised weight management program, and/or undergo bariatric surgery. Stop procrastinating.   Pain management considerations:  1.    Pharmacological Problems: Be advised that the use of opioid analgesics (oxycodone; hydrocodone; morphine; methadone; codeine; and all of their derivatives) have been associated with decreased metabolism and weight gain.  For this reason, should we see that you are unable to lose weight while taking these medications, it may become necessary for [place] to taper down and indefinitely discontinue them.  2.    Technical Problems: The incidence of successful interventional therapies decreases as the patient's BMI increases. It is much more difficult to accomplish a safe and effective interventional therapy on a patient with a BMI above 35. 3.    Radiation Exposure Problems: The x-rays machine, used to accomplish injection therapies, will automatically increase their x-ray output in order to capture an appropriate bone image. This means that radiation exposure increases exponentially with the patient's BMI. (The higher the BMI, the higher the radiation exposure.) Although the level of radiation used at a given time is still safe to the patient, it is not for the physician and/or assisting staff. Unfortunately, radiation exposure is accumulative. Because physicians and the staff have to do procedures and be exposed on a daily basis, this can result in health problems such as cancer and radiation burns. Radiation exposure to the staff is monitored by the  radiation batches that they wear. The exposure levels are reported back to the staff on a quarterly basis. Depending on levels of exposure, physicians and staff may be obligated by law to decrease this exposure. This means that they have the right and obligation to refuse providing therapies where they may be overexposed to radiation. For this reason, physicians may decline to offer therapies such as radiofrequency ablation or implants to patients with a BMI above 40. 4.    Current Trends: Be advised that the current trend is to no longer offer certain therapies to patients with a BMI equal to, or above 35, due to increase perioperative risks, increased technical procedural difficulties, and excessive radiation exposure to healthcare personnel.  ______________________________________________________________________________________________    ____________________________________________________________________________________________  General Risks and Possible Complications  Patient Responsibilities: It is important that you read this as it is part of your informed consent. It is our duty to inform you of the risks and possible complications associated with treatments offered to you. It is your responsibility as a patient to read this and to ask questions about anything that is not clear or that you believe was not covered in this document.  Patient's Rights: You have the right to refuse treatment. You also have the right to change your mind, even after initially having agreed to have the treatment done. However, under this last option, if you wait until the last second to change your mind, you may be charged for the materials used up to that point.  Introduction: Medicine is not an  exact science. Everything in Medicine, including the lack of treatment(s), carries the potential for danger, harm, or loss (which is by definition: Risk). In Medicine, a complication is a secondary problem, condition, or  disease that can aggravate an already existing one. All treatments carry the risk of possible complications. The fact that a side effects or complications occurs, does not imply that the treatment was conducted incorrectly. It must be clearly understood that these can happen even when everything is done following the highest safety standards.  No treatment: You can choose not to proceed with the proposed treatment alternative. The "PRO(s)" would include: avoiding the risk of complications associated with the therapy. The "CON(s)" would include: not getting any of the treatment benefits. These benefits fall under one of three categories: diagnostic; therapeutic; and/or palliative. Diagnostic benefits include: getting information which can ultimately lead to improvement of the disease or symptom(s). Therapeutic benefits are those associated with the successful treatment of the disease. Finally, palliative benefits are those related to the decrease of the primary symptoms, without necessarily curing the condition (example: decreasing the pain from a flare-up of a chronic condition, such as incurable terminal cancer).  General Risks and Complications: These are associated to most interventional treatments. They can occur alone, or in combination. They fall under one of the following six (6) categories: no benefit or worsening of symptoms; bleeding; infection; nerve damage; allergic reactions; and/or death. 1. No benefits or worsening of symptoms: In Medicine there are no guarantees, only probabilities. No healthcare provider can ever guarantee that a medical treatment will work, they can only state the probability that it may. Furthermore, there is always the possibility that the condition may worsen, either directly, or indirectly, as a consequence of the treatment. 2. Bleeding: This is more common if the patient is taking a blood thinner, either prescription or over the counter (example: Goody Powders, Fish oil,  Aspirin, Garlic, etc.), or if suffering a condition associated with impaired coagulation (example: Hemophilia, cirrhosis of the liver, low platelet counts, etc.). However, even if you do not have one on these, it can still happen. If you have any of these conditions, or take one of these drugs, make sure to notify your treating physician. 3. Infection: This is more common in patients with a compromised immune system, either due to disease (example: diabetes, cancer, human immunodeficiency virus [HIV], etc.), or due to medications or treatments (example: therapies used to treat cancer and rheumatological diseases). However, even if you do not have one on these, it can still happen. If you have any of these conditions, or take one of these drugs, make sure to notify your treating physician. 4. Nerve Damage: This is more common when the treatment is an invasive one, but it can also happen with the use of medications, such as those used in the treatment of cancer. The damage can occur to small secondary nerves, or to large primary ones, such as those in the spinal cord and brain. This damage may be temporary or permanent and it may lead to impairments that can range from temporary numbness to permanent paralysis and/or brain death. 5. Allergic Reactions: Any time a substance or material comes in contact with our body, there is the possibility of an allergic reaction. These can range from a mild skin rash (contact dermatitis) to a severe systemic reaction (anaphylactic reaction), which can result in death. 6. Death: In general, any medical intervention can result in death, most of the time due to an unforeseen complication.  ____________________________________________________________________________________________   

## 2020-04-24 NOTE — Assessment & Plan Note (Addendum)
TSH low on last lab check by Dr. Adair Patter Continue Synthroid  Levothyroxine 175 mcg 4 days a week and 150 mcg 3 days a week - recent dose change by Dr. Adair Patter She is planning to have repeat TSH and other labs by Dr. Adair Patter in the next couple months

## 2020-04-24 NOTE — Assessment & Plan Note (Signed)
INR checked today and low at 1.7 She has previously been too thin at 3.5 and 3.6 She has significantly increased her greens intake Discussed importance of consistency in vitamin K containing food intake Increase her dose back to 5 mg daily of warfarin Recheck INR in 2 weeks

## 2020-04-24 NOTE — Assessment & Plan Note (Addendum)
Well controlled with last [name] 6.1 Not on any oral medications UTD on vaccines, foot exam ROI sent for last eye exam On Statin Discussed diet and exercise F/u in 6 months

## 2020-04-24 NOTE — Assessment & Plan Note (Signed)
Labs checked on 03/29/2020 by Dr. Adair Patter Continue statin at current dose May increase medication if LDL continues to be above goal Goal LDL < 70

## 2020-04-24 NOTE — Progress Notes (Signed)
Safety precautions to be maintained throughout the outpatient stay will include: orient to surroundings, keep bed in low position, maintain call bell within reach at all times, provide assistance with transfer out of bed and ambulation.  

## 2020-04-24 NOTE — Assessment & Plan Note (Addendum)
Well controlled, borderline low but asymptomatic Will monitor at home and call if she has consistent hypertension is symptomatic Continue current medications Reviewed recent metabolic panel F/u in 6 months

## 2020-04-24 NOTE — Patient Instructions (Signed)
Description   Current dose: 5 mg daily Today changes: Take 5mg  daily Return in 2 weeks.

## 2020-04-25 ENCOUNTER — Ambulatory Visit: Payer: Medicaid Other

## 2020-04-25 DIAGNOSIS — E039 Hypothyroidism, unspecified: Secondary | ICD-10-CM | POA: Diagnosis not present

## 2020-04-26 ENCOUNTER — Telehealth: Payer: Self-pay | Admitting: Orthopedic Surgery

## 2020-04-26 ENCOUNTER — Other Ambulatory Visit: Payer: Self-pay

## 2020-04-26 ENCOUNTER — Encounter (HOSPITAL_COMMUNITY): Payer: Self-pay

## 2020-04-26 ENCOUNTER — Inpatient Hospital Stay (HOSPITAL_COMMUNITY): Payer: Medicaid Other

## 2020-04-26 VITALS — BP 107/51 | HR 55 | Temp 97.7°F | Resp 18

## 2020-04-26 DIAGNOSIS — Z86718 Personal history of other venous thrombosis and embolism: Secondary | ICD-10-CM | POA: Diagnosis not present

## 2020-04-26 DIAGNOSIS — N183 Chronic kidney disease, stage 3 unspecified: Secondary | ICD-10-CM | POA: Diagnosis not present

## 2020-04-26 DIAGNOSIS — E039 Hypothyroidism, unspecified: Secondary | ICD-10-CM | POA: Diagnosis not present

## 2020-04-26 DIAGNOSIS — E7800 Pure hypercholesterolemia, unspecified: Secondary | ICD-10-CM | POA: Diagnosis not present

## 2020-04-26 DIAGNOSIS — D631 Anemia in chronic kidney disease: Secondary | ICD-10-CM | POA: Diagnosis not present

## 2020-04-26 DIAGNOSIS — G4730 Sleep apnea, unspecified: Secondary | ICD-10-CM | POA: Diagnosis not present

## 2020-04-26 DIAGNOSIS — D649 Anemia, unspecified: Secondary | ICD-10-CM

## 2020-04-26 DIAGNOSIS — Z7982 Long term (current) use of aspirin: Secondary | ICD-10-CM | POA: Diagnosis not present

## 2020-04-26 DIAGNOSIS — E119 Type 2 diabetes mellitus without complications: Secondary | ICD-10-CM | POA: Diagnosis not present

## 2020-04-26 DIAGNOSIS — Z7901 Long term (current) use of anticoagulants: Secondary | ICD-10-CM | POA: Diagnosis not present

## 2020-04-26 DIAGNOSIS — F419 Anxiety disorder, unspecified: Secondary | ICD-10-CM | POA: Diagnosis not present

## 2020-04-26 DIAGNOSIS — Z79899 Other long term (current) drug therapy: Secondary | ICD-10-CM | POA: Diagnosis not present

## 2020-04-26 DIAGNOSIS — D509 Iron deficiency anemia, unspecified: Secondary | ICD-10-CM | POA: Diagnosis not present

## 2020-04-26 DIAGNOSIS — F429 Obsessive-compulsive disorder, unspecified: Secondary | ICD-10-CM | POA: Diagnosis not present

## 2020-04-26 DIAGNOSIS — G2581 Restless legs syndrome: Secondary | ICD-10-CM | POA: Diagnosis not present

## 2020-04-26 DIAGNOSIS — K219 Gastro-esophageal reflux disease without esophagitis: Secondary | ICD-10-CM | POA: Diagnosis not present

## 2020-04-26 DIAGNOSIS — F319 Bipolar disorder, unspecified: Secondary | ICD-10-CM | POA: Diagnosis not present

## 2020-04-26 MED ORDER — SODIUM CHLORIDE 0.9 % IV SOLN: Freq: Once | INTRAVENOUS | Status: AC

## 2020-04-26 MED ORDER — SODIUM CHLORIDE 0.9 % IV SOLN
510.0000 mg | Freq: Once | INTRAVENOUS | Status: AC
  Administered 2020-04-26: 510 mg via INTRAVENOUS
  Filled 2020-04-26: qty 510

## 2020-04-26 NOTE — Patient Instructions (Signed)
[facility] at [facility] Discharge Instructions  Received Feraheme infusion today. Follow-up as scheduled   Thank you for choosing [facility] at [facility] to provide your oncology and hematology care.  To afford each patient quality time with our provider, please arrive at least 15 minutes before your scheduled appointment time.   If you have a lab appointment with the Cancer Center please come in thru the Main Entrance and check in at the main information desk.  You need to re-schedule your appointment should you arrive 10 or more minutes late.  We strive to give you quality time with our providers, and arriving late affects you and other patients whose appointments are after yours.  Also, if you no show three or more times for appointments you may be dismissed from the clinic at the providers discretion.     Again, thank you for choosing [facility].  Our hope is that these requests will decrease the amount of time that you wait before being seen by our physicians.       _____________________________________________________________  Should you have questions after your visit to [facility], please contact our office at [phone] between the hours of 8:00 a.m. and 4:30 p.m.  Voicemails left after 4:00 p.m. will not be returned until the following business day.  For prescription refill requests, have your pharmacy contact our office and allow 72 hours.    Due to Covid, you will need to wear a mask upon entering the hospital. If you do not have a mask, a mask will be given to you at the Main Entrance upon arrival. For doctor visits, patients may have 1 support person with them. For treatment visits, patients can not have anyone with them due to social distancing guidelines and our immunocompromised population.     

## 2020-04-26 NOTE — Telephone Encounter (Signed)
Patient called and left voicemail asking if someone has found a brace big enough for her.  Please call her to advise  Thanks

## 2020-04-26 NOTE — Progress Notes (Signed)
Alesi Chronis tolerated Feraheme infusion well without complaints or incident. Peripheral IV site checked with positive blood return noted prior to and after infusion. VSS upon discharge. Pt discharged self ambulatory in satisfactory condition

## 2020-04-26 NOTE — Telephone Encounter (Signed)
Linton Rump is working with her. I gave her his number sent him another email

## 2020-04-27 DIAGNOSIS — E039 Hypothyroidism, unspecified: Secondary | ICD-10-CM | POA: Diagnosis not present

## 2020-04-28 DIAGNOSIS — E039 Hypothyroidism, unspecified: Secondary | ICD-10-CM | POA: Diagnosis not present

## 2020-04-28 LAB — COMPLIANCE DRUG ANALYSIS, UR

## 2020-04-29 DIAGNOSIS — E039 Hypothyroidism, unspecified: Secondary | ICD-10-CM | POA: Diagnosis not present

## 2020-04-30 DIAGNOSIS — E039 Hypothyroidism, unspecified: Secondary | ICD-10-CM | POA: Diagnosis not present

## 2020-05-01 DIAGNOSIS — E039 Hypothyroidism, unspecified: Secondary | ICD-10-CM | POA: Diagnosis not present

## 2020-05-02 ENCOUNTER — Encounter (INDEPENDENT_AMBULATORY_CARE_PROVIDER_SITE_OTHER): Payer: Self-pay | Admitting: Family Medicine

## 2020-05-02 ENCOUNTER — Ambulatory Visit (INDEPENDENT_AMBULATORY_CARE_PROVIDER_SITE_OTHER): Payer: Medicaid Other | Admitting: Family Medicine

## 2020-05-02 ENCOUNTER — Other Ambulatory Visit: Payer: Self-pay

## 2020-05-02 VITALS — BP 109/71 | HR 76 | Temp 98.0°F | Ht 68.0 in | Wt 261.0 lb

## 2020-05-02 DIAGNOSIS — E039 Hypothyroidism, unspecified: Secondary | ICD-10-CM | POA: Diagnosis not present

## 2020-05-02 DIAGNOSIS — Z6839 Body mass index (BMI) 39.0-39.9, adult: Secondary | ICD-10-CM

## 2020-05-02 DIAGNOSIS — E860 Dehydration: Secondary | ICD-10-CM

## 2020-05-02 DIAGNOSIS — E559 Vitamin D deficiency, unspecified: Secondary | ICD-10-CM | POA: Diagnosis not present

## 2020-05-02 MED ORDER — VITAMIN D (ERGOCALCIFEROL) 1.25 MG (50000 UNIT) PO CAPS: 50000.0000 [IU] | ORAL_CAPSULE | ORAL | 0 refills | Status: DC

## 2020-05-03 ENCOUNTER — Inpatient Hospital Stay (HOSPITAL_COMMUNITY): Payer: Medicaid Other

## 2020-05-03 ENCOUNTER — Encounter (HOSPITAL_COMMUNITY): Payer: Self-pay

## 2020-05-03 VITALS — BP 99/46 | HR 52 | Temp 97.8°F | Resp 18

## 2020-05-03 DIAGNOSIS — E7800 Pure hypercholesterolemia, unspecified: Secondary | ICD-10-CM | POA: Diagnosis not present

## 2020-05-03 DIAGNOSIS — D509 Iron deficiency anemia, unspecified: Secondary | ICD-10-CM | POA: Diagnosis not present

## 2020-05-03 DIAGNOSIS — Z79899 Other long term (current) drug therapy: Secondary | ICD-10-CM | POA: Diagnosis not present

## 2020-05-03 DIAGNOSIS — F429 Obsessive-compulsive disorder, unspecified: Secondary | ICD-10-CM | POA: Diagnosis not present

## 2020-05-03 DIAGNOSIS — E039 Hypothyroidism, unspecified: Secondary | ICD-10-CM | POA: Diagnosis not present

## 2020-05-03 DIAGNOSIS — F419 Anxiety disorder, unspecified: Secondary | ICD-10-CM | POA: Diagnosis not present

## 2020-05-03 DIAGNOSIS — F319 Bipolar disorder, unspecified: Secondary | ICD-10-CM | POA: Diagnosis not present

## 2020-05-03 DIAGNOSIS — Z86718 Personal history of other venous thrombosis and embolism: Secondary | ICD-10-CM | POA: Diagnosis not present

## 2020-05-03 DIAGNOSIS — E119 Type 2 diabetes mellitus without complications: Secondary | ICD-10-CM | POA: Diagnosis not present

## 2020-05-03 DIAGNOSIS — D649 Anemia, unspecified: Secondary | ICD-10-CM

## 2020-05-03 DIAGNOSIS — G4730 Sleep apnea, unspecified: Secondary | ICD-10-CM | POA: Diagnosis not present

## 2020-05-03 DIAGNOSIS — K219 Gastro-esophageal reflux disease without esophagitis: Secondary | ICD-10-CM | POA: Diagnosis not present

## 2020-05-03 DIAGNOSIS — D631 Anemia in chronic kidney disease: Secondary | ICD-10-CM | POA: Diagnosis not present

## 2020-05-03 DIAGNOSIS — G2581 Restless legs syndrome: Secondary | ICD-10-CM | POA: Diagnosis not present

## 2020-05-03 DIAGNOSIS — N183 Chronic kidney disease, stage 3 unspecified: Secondary | ICD-10-CM | POA: Diagnosis not present

## 2020-05-03 DIAGNOSIS — Z7901 Long term (current) use of anticoagulants: Secondary | ICD-10-CM | POA: Diagnosis not present

## 2020-05-03 DIAGNOSIS — Z7982 Long term (current) use of aspirin: Secondary | ICD-10-CM | POA: Diagnosis not present

## 2020-05-03 MED ORDER — SODIUM CHLORIDE 0.9 % IV SOLN
510.0000 mg | Freq: Once | INTRAVENOUS | Status: AC
  Administered 2020-05-03: 510 mg via INTRAVENOUS
  Filled 2020-05-03: qty 510

## 2020-05-03 MED ORDER — SODIUM CHLORIDE 0.9 % IV SOLN: Freq: Once | INTRAVENOUS | Status: AC

## 2020-05-03 NOTE — Patient Instructions (Signed)
[facility] at [facility] Discharge Instructions  Received Feraheme infusion today. Follow-up as scheduled   Thank you for choosing [facility] at [facility] to provide your oncology and hematology care.  To afford each patient quality time with our provider, please arrive at least 15 minutes before your scheduled appointment time.   If you have a lab appointment with the Cancer Center please come in thru the Main Entrance and check in at the main information desk.  You need to re-schedule your appointment should you arrive 10 or more minutes late.  We strive to give you quality time with our providers, and arriving late affects you and other patients whose appointments are after yours.  Also, if you no show three or more times for appointments you may be dismissed from the clinic at the providers discretion.     Again, thank you for choosing [facility].  Our hope is that these requests will decrease the amount of time that you wait before being seen by our physicians.       _____________________________________________________________  Should you have questions after your visit to [facility], please contact our office at [phone] between the hours of 8:00 a.m. and 4:30 p.m.  Voicemails left after 4:00 p.m. will not be returned until the following business day.  For prescription refill requests, have your pharmacy contact our office and allow 72 hours.    Due to Covid, you will need to wear a mask upon entering the hospital. If you do not have a mask, a mask will be given to you at the Main Entrance upon arrival. For doctor visits, patients may have 1 support person with them. For treatment visits, patients can not have anyone with them due to social distancing guidelines and our immunocompromised population.     

## 2020-05-03 NOTE — Progress Notes (Signed)
Chief Complaint:   OBESITY Jane [name] is here to discuss her progress with her obesity treatment plan along with follow-up of her obesity related diagnoses. Jane [name] is on the Category 4 Plan and states she is following her eating plan approximately 65% of the time. Jane [name] states she is doing 0 minutes 0 times per week.  Today's visit was #: 3 Starting weight: 283 lbs Starting date: 03/29/2020 Today's weight: 261 lbs Today's date: 05/02/2020 Total lbs lost to date: 22 Total lbs lost since last in-office visit: 14  Interim History: Jane [name] has lost an impressive amount of weight since starting the program. Much of this is water weight but she has lost fast as well.  Subjective:   1. Dehydration Jane [name]'s BUN has increased, and she is on hydrochlorothiazide. Her blood pressure has decreased but she denies lightheadedness.  2. Vitamin D deficiency Jane [name] is stable on Vit D, but her level is not yet at goal.  Assessment/Plan:   1. Dehydration Jane [name] will increase her water intake to at least 100 oz daily. We will recheck labs in 1-2 months.  2. Vitamin D deficiency Low Vitamin D level contributes to fatigue and are associated with obesity, breast, and colon cancer. We will refill prescription Vitamin D for 1 month. Jane [name] will follow-up for routine testing of Vitamin D, at least 2-3 times per year to avoid over-replacement. We will recheck labs in 1-2 months.  - Vitamin D, Ergocalciferol, (DRISDOL) 1.25 MG (50000 UNIT) CAPS capsule; Take 1 capsule (50,000 Units total) by mouth every 7 (seven) days.  Dispense: 4 capsule; Refill: 0  3. Class 2 severe obesity with serious comorbidity and body mass index (BMI) of 39.0 to 39.9 in adult, unspecified obesity type (HCC) Jane [name] is currently in the action stage of change. As such, her goal is to continue with weight loss efforts. She has agreed to the Category 4 Plan.   Behavioral modification strategies: increasing lean protein intake and no skipping  meals.  Jane [name] has agreed to follow-up with our clinic in 2 to 3 weeks with Dr. Jearld Shines. She was informed of the importance of frequent follow-up visits to maximize her success with intensive lifestyle modifications for her multiple health conditions.   Objective:   Blood pressure 109/71, pulse 76, temperature 98 F (36.7 C), temperature source Oral, height 5\' 8"  (1.727 m), weight 261 lb (118.4 kg), SpO2 96 %. Body mass index is 39.68 kg/m.  General: Cooperative, alert, well developed, in no acute distress. HEENT: Conjunctivae and lids unremarkable. Cardiovascular: Regular rhythm.  Lungs: Normal work of breathing. Neurologic: No focal deficits.   Lab Results  Component Value Date   CREATININE 1.24 (H) 04/13/2020   BUN 44 (H) 04/13/2020   NA 142 04/13/2020   K 4.3 04/13/2020   CL 107 04/13/2020   CO2 23 04/13/2020   Lab Results  Component Value Date   ALT 17 04/13/2020   AST 13 (L) 04/13/2020   ALKPHOS 86 04/13/2020   BILITOT 0.3 04/13/2020   Lab Results  Component Value Date   HGBA1C 6.2 (H) 03/29/2020   HGBA1C 6.3 (H) 10/25/2019   HGBA1C 6.3 (H) 03/26/2019   HGBA1C 7.5 11/18/2018   Lab Results  Component Value Date   INSULIN 21.5 03/29/2020   Lab Results  Component Value Date   TSH 0.275 (L) 03/29/2020   Lab Results  Component Value Date   CHOL 203 (H) 03/29/2020   HDL 37 (L) 03/29/2020   LDLCALC 114 (H) 03/29/2020  TRIG 296 (H) 03/29/2020   CHOLHDL 5.0 (H) 10/25/2019   Lab Results  Component Value Date   WBC 4.8 04/13/2020   HGB 13.1 04/13/2020   HCT 40.8 04/13/2020   MCV 96.5 04/13/2020   PLT 149 (L) 04/13/2020   Lab Results  Component Value Date   IRON 97 04/13/2020   TIBC 353 04/13/2020   FERRITIN 40 04/13/2020   Attestation Statements:   Reviewed by clinician on day of visit: allergies, medications, problem list, medical history, surgical history, family history, social history, and previous encounter notes.   I, Trixie Dredge, am acting  as transcriptionist for Dennard Nip, MD.  I have reviewed the above documentation for accuracy and completeness, and I agree with the above. -  Dennard Nip, MD

## 2020-05-03 NOTE — Progress Notes (Signed)
Milanie Hemmer tolerated Feraheme infusion well without complaints or incident. Peripheral IV site checked with positive blood return noted prior to and after infusion. VSS upon discharge. Pt discharged self ambulatory in satisfactory condition

## 2020-05-04 DIAGNOSIS — E039 Hypothyroidism, unspecified: Secondary | ICD-10-CM | POA: Diagnosis not present

## 2020-05-05 DIAGNOSIS — R519 Headache, unspecified: Secondary | ICD-10-CM | POA: Diagnosis not present

## 2020-05-05 DIAGNOSIS — I82409 Acute embolism and thrombosis of unspecified deep veins of unspecified lower extremity: Secondary | ICD-10-CM | POA: Diagnosis not present

## 2020-05-05 DIAGNOSIS — R4182 Altered mental status, unspecified: Secondary | ICD-10-CM | POA: Diagnosis not present

## 2020-05-05 DIAGNOSIS — E039 Hypothyroidism, unspecified: Secondary | ICD-10-CM | POA: Diagnosis not present

## 2020-05-06 DIAGNOSIS — E039 Hypothyroidism, unspecified: Secondary | ICD-10-CM | POA: Diagnosis not present

## 2020-05-07 DIAGNOSIS — E039 Hypothyroidism, unspecified: Secondary | ICD-10-CM | POA: Diagnosis not present

## 2020-05-09 ENCOUNTER — Ambulatory Visit (INDEPENDENT_AMBULATORY_CARE_PROVIDER_SITE_OTHER): Payer: Medicaid Other

## 2020-05-09 ENCOUNTER — Other Ambulatory Visit: Payer: Self-pay

## 2020-05-09 DIAGNOSIS — Z86718 Personal history of other venous thrombosis and embolism: Secondary | ICD-10-CM | POA: Diagnosis not present

## 2020-05-09 DIAGNOSIS — E039 Hypothyroidism, unspecified: Secondary | ICD-10-CM | POA: Diagnosis not present

## 2020-05-09 LAB — POCT INR
INR: 2.6 (ref 2.0–3.0)
PT: 31

## 2020-05-09 NOTE — Patient Instructions (Signed)
Description   Current dose: 5 mg daily Today changes: Take 5mg  daily Return in 2 weeks.

## 2020-05-10 DIAGNOSIS — E039 Hypothyroidism, unspecified: Secondary | ICD-10-CM | POA: Diagnosis not present

## 2020-05-11 ENCOUNTER — Other Ambulatory Visit (HOSPITAL_COMMUNITY): Payer: Self-pay | Admitting: Psychiatry

## 2020-05-11 DIAGNOSIS — E039 Hypothyroidism, unspecified: Secondary | ICD-10-CM | POA: Diagnosis not present

## 2020-05-12 DIAGNOSIS — E039 Hypothyroidism, unspecified: Secondary | ICD-10-CM | POA: Diagnosis not present

## 2020-05-13 DIAGNOSIS — E039 Hypothyroidism, unspecified: Secondary | ICD-10-CM | POA: Diagnosis not present

## 2020-05-14 DIAGNOSIS — E039 Hypothyroidism, unspecified: Secondary | ICD-10-CM | POA: Diagnosis not present

## 2020-05-15 DIAGNOSIS — F419 Anxiety disorder, unspecified: Secondary | ICD-10-CM | POA: Diagnosis not present

## 2020-05-15 DIAGNOSIS — Z90710 Acquired absence of both cervix and uterus: Secondary | ICD-10-CM | POA: Diagnosis not present

## 2020-05-15 DIAGNOSIS — F319 Bipolar disorder, unspecified: Secondary | ICD-10-CM | POA: Diagnosis not present

## 2020-05-15 DIAGNOSIS — N3001 Acute cystitis with hematuria: Secondary | ICD-10-CM | POA: Diagnosis not present

## 2020-05-15 DIAGNOSIS — Z90722 Acquired absence of ovaries, bilateral: Secondary | ICD-10-CM | POA: Diagnosis not present

## 2020-05-15 DIAGNOSIS — E785 Hyperlipidemia, unspecified: Secondary | ICD-10-CM | POA: Diagnosis not present

## 2020-05-15 DIAGNOSIS — Z9289 Personal history of other medical treatment: Secondary | ICD-10-CM | POA: Diagnosis not present

## 2020-05-15 DIAGNOSIS — E039 Hypothyroidism, unspecified: Secondary | ICD-10-CM | POA: Diagnosis not present

## 2020-05-15 DIAGNOSIS — I10 Essential (primary) hypertension: Secondary | ICD-10-CM | POA: Diagnosis not present

## 2020-05-15 DIAGNOSIS — Z86718 Personal history of other venous thrombosis and embolism: Secondary | ICD-10-CM | POA: Diagnosis not present

## 2020-05-16 ENCOUNTER — Other Ambulatory Visit: Payer: Self-pay

## 2020-05-16 ENCOUNTER — Ambulatory Visit
Payer: Medicaid Other | Attending: Student in an Organized Health Care Education/Training Program | Admitting: Student in an Organized Health Care Education/Training Program

## 2020-05-16 ENCOUNTER — Ambulatory Visit (INDEPENDENT_AMBULATORY_CARE_PROVIDER_SITE_OTHER): Payer: Medicaid Other | Admitting: Family Medicine

## 2020-05-16 ENCOUNTER — Encounter: Payer: Self-pay | Admitting: Student in an Organized Health Care Education/Training Program

## 2020-05-16 VITALS — BP 147/75 | HR 70 | Temp 98.2°F | Resp 18 | Ht 68.0 in | Wt 265.0 lb

## 2020-05-16 DIAGNOSIS — Z7901 Long term (current) use of anticoagulants: Secondary | ICD-10-CM

## 2020-05-16 DIAGNOSIS — G894 Chronic pain syndrome: Secondary | ICD-10-CM

## 2020-05-16 DIAGNOSIS — M5030 Other cervical disc degeneration, unspecified cervical region: Secondary | ICD-10-CM | POA: Diagnosis not present

## 2020-05-16 DIAGNOSIS — E1142 Type 2 diabetes mellitus with diabetic polyneuropathy: Secondary | ICD-10-CM | POA: Diagnosis not present

## 2020-05-16 DIAGNOSIS — G8929 Other chronic pain: Secondary | ICD-10-CM

## 2020-05-16 DIAGNOSIS — M51369 Other intervertebral disc degeneration, lumbar region without mention of lumbar back pain or lower extremity pain: Secondary | ICD-10-CM

## 2020-05-16 DIAGNOSIS — E039 Hypothyroidism, unspecified: Secondary | ICD-10-CM | POA: Diagnosis not present

## 2020-05-16 DIAGNOSIS — M961 Postlaminectomy syndrome, not elsewhere classified: Secondary | ICD-10-CM | POA: Diagnosis not present

## 2020-05-16 DIAGNOSIS — M792 Neuralgia and neuritis, unspecified: Secondary | ICD-10-CM | POA: Diagnosis not present

## 2020-05-16 DIAGNOSIS — M7918 Myalgia, other site: Secondary | ICD-10-CM | POA: Insufficient documentation

## 2020-05-16 DIAGNOSIS — Z0289 Encounter for other administrative examinations: Secondary | ICD-10-CM

## 2020-05-16 DIAGNOSIS — M5136 Other intervertebral disc degeneration, lumbar region: Secondary | ICD-10-CM | POA: Diagnosis not present

## 2020-05-16 MED ORDER — HYDROCODONE-ACETAMINOPHEN 10-325 MG PO TABS: 1.0000 | ORAL_TABLET | Freq: Four times a day (QID) | ORAL | 0 refills | Status: AC | PRN

## 2020-05-16 MED ORDER — HYDROCODONE-ACETAMINOPHEN 10-325 MG PO TABS: 1.0000 | ORAL_TABLET | Freq: Four times a day (QID) | ORAL | 0 refills | Status: DC | PRN

## 2020-05-16 MED ORDER — CYCLOBENZAPRINE HCL 10 MG PO TABS: 10.0000 mg | ORAL_TABLET | Freq: Every day | ORAL | 1 refills | Status: DC

## 2020-05-16 NOTE — Progress Notes (Signed)
PROVIDER NOTE: Information contained herein reflects review and annotations entered in association with encounter. Interpretation of such information and data should be left to medically-trained personnel. Information provided to patient can be located elsewhere in the medical record under "Patient Instructions". Document created using STT-dictation technology, any transcriptional errors that may result from process are unintentional.    Patient: Jane [name]  Service Category: E/M  Provider: Gillis Santa, MD  DOB: 1966/05/21  DOS: 05/16/2020  Specialty: Interventional Pain Management  MRN: [id number]  Setting: Ambulatory outpatient  PCP: [name], MD  Type: Established Patient    Referring Provider: [name], MD  Location: Office  Delivery: Face-to-face     HPI  Reason for encounter: Jane [name], a 54 y.o. year old female, is here today for evaluation and management of her Chronic pain syndrome [G89.4]. Jane [name] primary complain today is Back Pain (low left) Last encounter: Practice (04/24/2020). My last encounter with her was on 04/24/20 with Dr [name]. 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 Pain Assessment: Severity of Chronic pain is reported as a 7 /10. Location: Back Lower, Left/radiates down left leg in the back and into foot. Onset: More than a month ago. Quality: Constant, Sharp. Timing: Constant. Modifying factor(s): denies. Vitals:  height is _0  (1.727 m) and weight is 265 lb (120.2 kg). Her temperature is 98.2 F (36.8 C). Her blood pressure is 147/75 (abnormal) and her pulse is 70. Her respiration is 18 and oxygen saturation is 99%.    From initial patient note on 04/24/20 with Dr [name]:  "The patient comes into the clinics today for the first time for a chronic pain management evaluation.  According to the patient the primary area of pain is that of the left lower back with no pain radiating onto the right side.  She admits to having had 3 back surgeries by Dr. Carloyn Manner, the last one having been around 2015.  She indicates that she has not had any physical therapy because it was not paid by Medicaid, who only agreed to pay for one single visit.  She has had some x-rays done by Dr. [name].  The patient's secondary area of pain is that of the left hip and buttocks area, which she refers is associated with the low back pain.  The pain runs down the back of her leg to the buttocks and occasionally just below the knee.  However, she has difficulty telling if there is any type of involvement of her foot secondary to the fact that she has bilateral diabetic peripheral neuropathy.  The patient's third area of pain is that of her feet secondary to the bilateral diabetic peripheral neuropathy.  The patient indicates having had a nerve conduction test that was done many years ago in [place], with a confirmed that she had that neuropathy.  The patient indicates that she is being followed by Dr. Gwenlyn Saran from  vein and vascular surgery in [facility] for her Coumadin.  She indicates that currently she takes 3.5 mg/day but she has been up as high as 5 mg and as low as 1.  She  is currently getting her pain medication from her primary care physician.  Finally, the patient also indicates having some pain in the back of her neck, with no radiation of the pain to the upper extremities.  Regarding the patient's medications, she indicates currently taking hydrocodone/APAP 5/325 1 tablet p.o. twice daily.  Labs show her kidney function to be having some problems with some probable chronic kidney disease stage III-4.  Liver enzymes seem to be adequate.  The patient also indicates that in the past she has taken as much as 10 mg of hydrocodone 4 times a day, without any problems.  After today's evaluation, I suggested that perhaps she be switched to oxycodone IR 5 mg 3 times daily.  Final decision as to what and how much she will take depends on their review and evaluation of Dr. Gillis Santa."  Patient's urine toxicology screen is appropriate.  We will take her on for chronic opioid therapy as below.  Hydrocodone 10 mg 4 times daily as needed.  No dose escalation beyond this.  She will sign pain contract with [place] today.  Patient is not a candidate for interventional therapies as she does not feel comfortable stopping her blood thinners due to May Thurner syndrome.  Pharmacotherapy Assessment   Analgesic: Hydrocodone 10 mg 4 times daily as needed, quantity 120/month (first prescription today)  Monitoring: [place] PMP: PDMP reviewed during this encounter.       Pharmacotherapy: No side-effects or adverse reactions reported. Compliance: No problems identified. Effectiveness: Clinically acceptable.  Dewayne Shorter, RN  05/16/2020 10:27 AM  Signed Safety precautions to be maintained throughout the outpatient stay will include: orient to surroundings, keep bed in low position, maintain call bell within reach at all times, provide assistance with transfer out of bed and ambulation. Patient is currently on antibiotics for a UTI    UDS:  Summary  Date Value Ref Range Status  04/24/2020  Note  Final    Comment:    ==================================================================== Compliance Drug Analysis, Ur ==================================================================== Test                             Result       Flag       Units  Drug Present and Declared for Prescription Verification   Amphetamine                    13465        EXPECTED   ng/mg creat    Amphetamine is available as a schedule II prescription drug.    Hydrocodone                    335          EXPECTED   ng/mg creat   Dihydrocodeine                 104          EXPECTED   ng/mg creat   Norhydrocodone                 284          EXPECTED   ng/mg creat    Sources of hydrocodone include scheduled prescription medications.  Dihydrocodeine and norhydrocodone are expected metabolites of    hydrocodone. Dihydrocodeine is also available as a scheduled    prescription medication.    Pregabalin                     PRESENT      EXPECTED   Cyclobenzaprine                PRESENT      EXPECTED   Desmethylcyclobenzaprine       PRESENT      EXPECTED    Desmethylcyclobenzaprine is an expected metabolite of    cyclobenzaprine.    Fluoxetine                     PRESENT      EXPECTED   Norfluoxetine                  PRESENT      EXPECTED    Norfluoxetine is an expected metabolite of fluoxetine.    Acetaminophen                  PRESENT      EXPECTED  Drug Absent but Declared for Prescription Verification   Alprazolam                     Not Detected UNEXPECTED ng/mg creat   Salicylate                     Not Detected UNEXPECTED    Aspirin, as indicated in the declared medication list, is not always    detected even when used as directed.  ==================================================================== Test                      Result    Flag   Units      Ref Range   Creatinine              55               mg/dL       >=20 ==================================================================== Declared Medications:  The flagging and interpretation on this report are based on the  following declared medications.  Unexpected results may arise from  inaccuracies in the declared medications.   **Note: The testing scope of this panel includes these medications:   Alprazolam (Xanax)  Amphetamine (Adderall)  Cyclobenzaprine (Flexeril)  Fluoxetine (Prozac)  Hydrocodone (Norco)  Pregabalin (Lyrica)   **Note: The testing scope of this panel does not include small to  moderate amounts of these reported medications:   Acetaminophen (Norco)  Aspirin   **Note: The testing scope of this panel does not include the  following reported medications:   Atorvastatin (Lipitor)  Cetirizine (Zyrtec)  Eye Drop  Hydrochlorothiazide (Hydrodiuril)  Levothyroxine (Synthroid)  Linaclotide (Linzess)  Lisinopril (Zestril)  Magnesium  Multivitamin  Olopatadine  Pantoprazole (Protonix)  Potassium (Klor-Con)  Ropinirole (Requip)  Vitamin C  Vitamin D2 (Drisdol)  Warfarin (Coumadin) ==================================================================== For clinical consultation, please call [phone]. ====================================================================      ROS  Constitutional: Denies any fever or chills Gastrointestinal: No reported hemesis, hematochezia, vomiting, or acute GI distress Musculoskeletal: Denies any acute onset joint swelling, redness, loss of ROM, or weakness Neurological: No reported episodes of acute onset apraxia, aphasia, dysarthria, agnosia, amnesia, paralysis, loss of coordination, or loss of consciousness  Medication Review  ALPRAZolam, FLUoxetine, Garlic, HYDROcodone-acetaminophen, Magnesium, Olopatadine HCl, Omega-3 Fatty Acids, Vitamin  D (Ergocalciferol), amphetamine-dextroamphetamine, aspirin EC, atorvastatin, cefdinir, cetirizine, cycloSPORINE, cyclobenzaprine,  levothyroxine, linaclotide, lisinopril, multivitamin, pantoprazole, potassium chloride, pregabalin, rOPINIRole, vitamin C, and warfarin  History Review  Allergy: Jane [name] is allergic to aripiprazole, seroquel [quetiapine fumarate], chlorpromazine, and gabapentin. Drug: Jane [name]  reports no history of drug use. Alcohol:  reports no history of alcohol use. Tobacco:  reports that she has never smoked. She has never used smokeless tobacco. Social: Jane [name]  reports that she has never smoked. She has never used smokeless tobacco. She reports that she does not drink alcohol and does not use drugs. Medical:  has a past medical history of ADD (attention deficit disorder), Anxiety, Back pain, Bilateral swelling of feet, Bipolar 1 disorder ([place]), Bipolar 1 disorder ([place]), Bipolar disorder ([place]), Chewing difficulty, Chronic fatigue syndrome, Chronic kidney disease, Constipation, Depression, Diabetes mellitus without complication ([place]), Dyspnea, GERD (gastroesophageal reflux disease), Headache, High cholesterol, History of blood clots, History of DVT (deep vein thrombosis), Hypertension, Hypothyroidism, Joint pain, Neuropathy, Obsessive-compulsive disorder, Peripheral vascular disease ([place]), Prediabetes, Respiratory failure requiring intubation ([place]), Restless leg syndrome, Shortness of breath, Sleep apnea, Swallowing difficulty, Thrombocytopenia ([place]) (09/15/2019), Trigger thumb of left hand (01/2018), and Trigger thumb of right hand. Surgical: Jane [name]  has a past surgical history that includes Cholecystectomy; Trigger finger release (Right, 12/01/2017); Abdominal hysterectomy (06/2016); Lumbar fusion (11/21/2000); Lumbar spine surgery; Trigger finger release (Left, 01/26/2018); LOWER EXTREMITY VENOGRAPHY (N/A, 08/17/2018); Femoral-femoral Bypass Graft (Left, 11/24/2018); AV fistula placement (Left, 11/24/2018); LOWER EXTREMITY VENOGRAPHY (Bilateral, 03/09/2019); Patch angioplasty (Left, 03/30/2019);  Ultrasound guidance for vascular access (Right, 03/30/2019); Femoral artery debridement (03/30/2019); Insertion of iliac stent (03/30/2019); Groin debridement (Left, 04/27/2019); Application if wound vac (Left, 04/27/2019); LOWER EXTREMITY VENOGRAPHY (Left, 08/16/2019); PERIPHERAL VASCULAR INTERVENTION (Left, 08/16/2019); Back surgery; and Colonoscopy with propofol (N/A, 11/22/2019). Family: family history includes ADD / ADHD in her daughter; Anxiety disorder in her daughter; Bipolar disorder in her daughter, maternal aunt, and mother; Depression in her mother; Diabetes in her father; Drug abuse in her daughter and daughter; Heart disease in her brother, father, and mother; Hyperlipidemia in her brother, father, and mother; Hypertension in her brother, father, mother, and sister; Obesity in her father and mother; Sleep apnea in her father and mother; Stroke in her mother; Suicidality in her maternal aunt.  Laboratory Chemistry Profile   Renal Lab Results  Component Value Date   BUN 44 (H) 04/13/2020   CREATININE 1.24 (H) 04/13/2020   BCR 18 03/29/2020   GFRAA 57 (L) 04/13/2020   GFRNONAA 50 (L) 04/13/2020     Hepatic Lab Results  Component Value Date   AST 13 (L) 04/13/2020   ALT 17 04/13/2020   ALBUMIN 4.3 04/13/2020   ALKPHOS 86 04/13/2020     Electrolytes Lab Results  Component Value Date   NA 142 04/13/2020   K 4.3 04/13/2020   CL 107 04/13/2020   CALCIUM 9.7 04/13/2020   MG 2.2 04/04/2019   PHOS 4.0 04/04/2019     Bone Lab Results  Component Value Date   VD25OH 38.0 03/29/2020     Inflammation (CRP: Acute Phase) (ESR: Chronic Phase) Lab Results  Component Value Date   CRP 11 (H) 02/16/2020   ESRSEDRATE 33 02/16/2020   LATICACIDVEN 1.2 01/05/2020       Note: Above Lab results reviewed.  Recent Imaging Review  US Venous Img Lower Bilateral (DVT) CLINICAL DATA:  Pain and edema, discoloration  EXAM: BILATERAL LOWER EXTREMITY VENOUS DOPPLER  ULTRASOUND  TECHNIQUE: Gray-scale sonography with graded compression,  as well as color Doppler and duplex ultrasound were performed to evaluate the lower extremity deep venous systems from the level of the common femoral vein and including the common femoral, femoral, profunda femoral, popliteal and calf veins including the posterior tibial, peroneal and gastrocnemius veins when visible. The superficial great saphenous vein was also interrogated. Spectral Doppler was utilized to evaluate flow at rest and with distal augmentation maneuvers in the common femoral, femoral and popliteal veins.  COMPARISON:  None.  FINDINGS: RIGHT LOWER EXTREMITY  Common Femoral Vein: No evidence of thrombus. Normal compressibility, respiratory phasicity and response to augmentation.  Saphenofemoral Junction: No evidence of thrombus. Normal compressibility and flow on color Doppler imaging.  Profunda Femoral Vein: No evidence of thrombus. Normal compressibility and flow on color Doppler imaging.  Femoral Vein: No evidence of thrombus. Normal compressibility, respiratory phasicity and response to augmentation.  Popliteal Vein: No evidence of thrombus. Normal compressibility, respiratory phasicity and response to augmentation.  Calf Veins: No evidence of thrombus. Normal compressibility and flow on color Doppler imaging.  LEFT LOWER EXTREMITY  Common Femoral Vein: No evidence of thrombus. Normal compressibility, respiratory phasicity and response to augmentation.  Saphenofemoral Junction: No evidence of thrombus. Normal compressibility and flow on color Doppler imaging.  Profunda Femoral Vein: No evidence of thrombus. Normal compressibility and flow on color Doppler imaging.  Femoral Vein: No evidence of thrombus. Normal compressibility, respiratory phasicity and response to augmentation.  Popliteal Vein: No evidence of thrombus. Normal compressibility, respiratory phasicity and response  to augmentation.  Calf Veins: No evidence of thrombus. Normal compressibility and flow on color Doppler imaging.  IMPRESSION: No evidence of deep venous thrombosis in either lower extremity.  Electronically Signed   By: Jerilynn Mages.  Shick M.D.   On: 04/21/2020 15:11 Note: Reviewed        Physical Exam  General appearance: Well nourished, well developed, and well hydrated. In no apparent acute distress Mental status: Alert, oriented x 3 (person, place, & time)       Respiratory: No evidence of acute respiratory distress Eyes: PERLA Vitals: BP (!) 147/75   Pulse 70   Temp 98.2 F (36.8 C)   Resp 18   Ht _0  (1.727 m)   Wt 265 lb (120.2 kg)   LMP  (LMP Unknown)   SpO2 99%   BMI 40.29 kg/m  BMI: Estimated body mass index is 40.29 kg/m as calculated from the following:   Height as of this encounter: _1  (1.727 m).   Weight as of this encounter: 265 lb (120.2 kg). Ideal: Ideal body weight: 63.9 kg (140 lb 14 oz) Adjusted ideal body weight: 86.4 kg (190 lb 8.4 oz)  Lumbar Spine Area Exam  Skin & Axial Inspection: Well healed scar from previous spine surgery detected Alignment: Asymmetric Functional ROM: Decreased ROM       Stability: No instability detected Muscle Tone/Strength: Functionally intact. No obvious neuro-muscular anomalies detected. Sensory (Neurological): Dermatomal pain pattern  Provocative Tests: Hyperextension/rotation test: (+) bilaterally for facet joint pain. Lumbar quadrant test (Kemp's test): (+) bilaterally for facet joint pain.   Gait & Posture Assessment  Ambulation: Limited Gait: Antalgic gait (limping) Posture: Difficulty standing up straight, due to pain  Lower Extremity Exam    Side: Right lower extremity  Side: Left lower extremity  Stability: No instability observed          Stability: No instability observed          Skin & Extremity Inspection: Skin color, temperature, and  hair growth are WNL. No peripheral edema or cyanosis. No masses,  redness, swelling, asymmetry, or associated skin lesions. No contractures.  Skin & Extremity Inspection: Skin color, temperature, and hair growth are WNL. No peripheral edema or cyanosis. No masses, redness, swelling, asymmetry, or associated skin lesions. No contractures.  Functional ROM: Decreased ROM for hip and knee joints          Functional ROM: Decreased ROM for hip and knee joints          Muscle Tone/Strength: Functionally intact. No obvious neuro-muscular anomalies detected.  Muscle Tone/Strength: Functionally intact. No obvious neuro-muscular anomalies detected.  Sensory (Neurological): Unimpaired        Sensory (Neurological): Unimpaired        DTR: Patellar: deferred today Achilles: deferred today Plantar: deferred today  DTR: Patellar: deferred today Achilles: deferred today Plantar: deferred today  Palpation: No palpable anomalies  Palpation: No palpable anomalies    Assessment   Status Diagnosis  Controlled Controlled Controlled 1. Chronic pain syndrome   2. DDD (degenerative disc disease), cervical   3. DDD (degenerative disc disease), lumbar   4. Failed back surgical syndrome   5. Diabetic peripheral neuropathy ([place])   6. Neurogenic pain   7. Chronic musculoskeletal pain   8. Chronic anticoagulation (Coumadin)   9. Pain management contract signed      Updated Problems: Problem  Chronic Pain Syndrome  Pharmacologic Therapy  Abnormal MRI, cervical spine (01/08/2017)   IMPRESSION: 1. Small central soft disc protrusion at C5-6 with no neural impingement. 2. Minimal degenerative disc disease at C3-4, C4-5, and C6-7.   Electronically Signed   By: Lorriane Shire M.D.   On: 01/08/2017 16:51   Abnormal MRI, lumbar spine (05/11/2014)   IMPRESSION:  1. Interval development of a moderate-sized posterolateral disc  extrusion on the left at L4-5. There is caudal migration of a free  disc fragment causing left L5 nerve root encroachment.  2. Stable findings at  L5-S1 status post laminectomy and Ray cage  fusion.  3. Stable mild disc bulging at L3-4.    Electronically Signed  By: Camie Patience M.D.  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   Displacement of Lumbar Intervertebral Disc Without Myelopathy  Radicular Low Back Pain  Depression, Major, Recurrent ([facility])  Bipolar 1 disorder ([facility])  History of Recurrent Deep Vein Thrombosis (Dvt)    Plan of Care  Jane [name] has a current medication list which includes the following long-term medication(s): amphetamine-dextroamphetamine, atorvastatin, cetirizine, fluoxetine, levothyroxine, levothyroxine, linaclotide, lisinopril, pantoprazole, potassium chloride, ropinirole, and warfarin.  Pharmacotherapy (Medications Ordered): Meds ordered this encounter  Medications  . HYDROcodone-acetaminophen (NORCO) 10-325 MG tablet    Sig: Take 1 tablet by mouth every 6 (six) hours as needed for severe pain. Must last 30 days.  Dispense:  120 tablet    Refill:  0    Chronic Pain. (STOP Act - Not applicable). Fill one day early if closed on scheduled refill date.  Marland Kitchen HYDROcodone-acetaminophen (NORCO) 10-325 MG tablet    Sig: Take 1 tablet by mouth every 6 (six) hours as needed for  severe pain. Must last 30 days.    Dispense:  120 tablet    Refill:  0    Chronic Pain. (STOP Act - Not applicable). Fill one day early if closed on scheduled refill date.  . cyclobenzaprine (FLEXERIL) 10 MG tablet    Sig: Take 1 tablet (10 mg total) by mouth at bedtime.    Dispense:  30 tablet    Refill:  1    Do not place this medication, or any other prescription from our practice, on "Automatic Refill". Patient may have prescription filled one day early if pharmacy is closed on scheduled refill date.   Follow-up plan:   Return in about 8 weeks (around 07/11/2020) for Medication Management, in person.   Recent Visits Date Type Provider Dept  04/24/20 Office Visit Milinda Pointer, MD [facility]  Showing recent visits within past 90 days and meeting all other requirements Today's Visits Date Type Provider Dept  05/16/20 Office Visit Gillis Santa, MD [facility]  Showing today's visits and meeting all other requirements Future Appointments Date Type Provider Dept  07/11/20 Appointment Gillis Santa, MD [facility]  Showing future appointments within next 90 days and meeting all other requirements  I discussed the assessment and treatment plan with the patient. The patient was provided an opportunity to ask questions and all were answered. The patient agreed with the plan and demonstrated an understanding of the instructions.  Patient advised to call back or seek an in-person evaluation if the symptoms or condition worsens.  Duration of encounter: 73mnutes.  Note by: BGillis Santa MD Date: 05/16/2020; Time: 12:41 PM

## 2020-05-16 NOTE — Progress Notes (Signed)
Safety precautions to be maintained throughout the outpatient stay will include: orient to surroundings, keep bed in low position, maintain call bell within reach at all times, provide assistance with transfer out of bed and ambulation. Patient is currently on antibiotics for a UTI

## 2020-05-17 DIAGNOSIS — E039 Hypothyroidism, unspecified: Secondary | ICD-10-CM | POA: Diagnosis not present

## 2020-05-17 DIAGNOSIS — M5136 Other intervertebral disc degeneration, lumbar region: Secondary | ICD-10-CM | POA: Diagnosis not present

## 2020-05-17 DIAGNOSIS — M791 Myalgia, unspecified site: Secondary | ICD-10-CM | POA: Diagnosis not present

## 2020-05-18 ENCOUNTER — Telehealth (INDEPENDENT_AMBULATORY_CARE_PROVIDER_SITE_OTHER): Payer: Medicaid Other | Admitting: Psychiatry

## 2020-05-18 ENCOUNTER — Ambulatory Visit (INDEPENDENT_AMBULATORY_CARE_PROVIDER_SITE_OTHER): Payer: Medicaid Other | Admitting: Family Medicine

## 2020-05-18 ENCOUNTER — Encounter (INDEPENDENT_AMBULATORY_CARE_PROVIDER_SITE_OTHER): Payer: Self-pay | Admitting: Family Medicine

## 2020-05-18 ENCOUNTER — Encounter (HOSPITAL_COMMUNITY): Payer: Self-pay | Admitting: Psychiatry

## 2020-05-18 ENCOUNTER — Other Ambulatory Visit: Payer: Self-pay

## 2020-05-18 VITALS — BP 96/60 | HR 63 | Temp 97.9°F | Ht 68.0 in | Wt 264.0 lb

## 2020-05-18 DIAGNOSIS — E1159 Type 2 diabetes mellitus with other circulatory complications: Secondary | ICD-10-CM | POA: Diagnosis not present

## 2020-05-18 DIAGNOSIS — I10 Essential (primary) hypertension: Secondary | ICD-10-CM | POA: Diagnosis not present

## 2020-05-18 DIAGNOSIS — E039 Hypothyroidism, unspecified: Secondary | ICD-10-CM | POA: Diagnosis not present

## 2020-05-18 DIAGNOSIS — E66813 Obesity, class 3: Secondary | ICD-10-CM

## 2020-05-18 DIAGNOSIS — E038 Other specified hypothyroidism: Secondary | ICD-10-CM

## 2020-05-18 DIAGNOSIS — I152 Hypertension secondary to endocrine disorders: Secondary | ICD-10-CM

## 2020-05-18 DIAGNOSIS — F3130 Bipolar disorder, current episode depressed, mild or moderate severity, unspecified: Secondary | ICD-10-CM

## 2020-05-18 DIAGNOSIS — Z6841 Body Mass Index (BMI) 40.0 and over, adult: Secondary | ICD-10-CM

## 2020-05-18 MED ORDER — AMPHETAMINE-DEXTROAMPHETAMINE 30 MG PO TABS
30.0000 mg | ORAL_TABLET | Freq: Two times a day (BID) | ORAL | 0 refills | Status: DC
Start: 2020-05-18 — End: 2020-08-21

## 2020-05-18 MED ORDER — AMPHETAMINE-DEXTROAMPHETAMINE 30 MG PO TABS: 30.0000 mg | ORAL_TABLET | Freq: Two times a day (BID) | ORAL | 0 refills | Status: DC

## 2020-05-18 MED ORDER — FLUOXETINE HCL 40 MG PO CAPS: ORAL_CAPSULE | ORAL | 2 refills | Status: DC

## 2020-05-18 MED ORDER — ALPRAZOLAM 1 MG PO TABS: 1.0000 mg | ORAL_TABLET | Freq: Two times a day (BID) | ORAL | 2 refills | Status: DC | PRN

## 2020-05-18 NOTE — Progress Notes (Signed)
Virtual Visit via Video Note  I connected with Jane [name] on 05/18/20 at 11:00 AM EDT by a video enabled telemedicine application and verified that I am speaking with the correct person using two identifiers.   I discussed the limitations of evaluation and management by telemedicine and the availability of in person appointments. The patient expressed understanding and agreed to proceed    I discussed the assessment and treatment plan with the patient. The patient was provided an opportunity to ask questions and all were answered. The patient agreed with the plan and demonstrated an understanding of the instructions.   The patient was advised to call back or seek an in-person evaluation if the symptoms worsen or if the condition fails to improve as anticipated.  I provided 15 minutes of non-face-to-face time during this encounter. Location: Provider home, patient home  [name], MD  [facility] MD/PA/NP OP Progress Note  05/18/2020 11:07 AM Jane [name]  MRN:   [id number]  Chief Complaint:  Chief Complaint    Anxiety; Depression; Follow-up     HPI: This patient is a 54year-old divorced white female who lives alone in [place]. She is on disability due to bipolar disorder.  The patient states that she became very depressed when her mother died in 02-07-2000 of a brain aneurysm. She started getting. Sad but eventually became manic and develop mood swings and agitated behavior. She's been on numerous medications over the years. In 07-Feb-1988 she also had postpartum depression. For many years she was on lithium but it caused a bad taste in her mouth and her doctor was worried it might be starting to affect her renal function so was discontinued. She also thinks that it stopped working after a number of years  Patient returns for follow-up after 3 months.  She states overall she is doing okay in terms of her mental status.  She denies being significantly depressed or anxious.  Her youngest daughter moved  around the corner from her and they had their ups and downs of getting along but she enjoys being able to see her grandchildren easily.  She is having difficulty getting along with another daughter but this has been longstanding.  In general she is sleeping okay and she denies serious depression or suicidal ideation.  Her anxiety is under good control and she is focusing well with the Adderall. Visit Diagnosis:    ICD-10-CM   1. Bipolar I disorder, most recent episode depressed ([place])  F31.30     Past Psychiatric History: Long-term outpatient treatment  Past Medical History:  Past Medical History:  Diagnosis Date  . ADD (attention deficit disorder)   . Anxiety   . Back pain   . Bilateral swelling of feet   . Bipolar 1 disorder ([place])   . Bipolar 1 disorder ([place])   . Bipolar disorder ([place])   . Chewing difficulty   . Chronic fatigue syndrome   . Chronic kidney disease    Stage 3 kidney disease;dx by Dr. Sinda Du.   . Constipation   . Depression   . Diabetes mellitus without complication ([place])   . Dyspnea    with exertion  . GERD (gastroesophageal reflux disease)   . Headache    migraines  . High cholesterol   . History of blood clots   . History of DVT (deep vein thrombosis)    left leg  . Hypertension    states under control with meds., has been on med. x 2 years  . Hypothyroidism   .  Joint pain   . Neuropathy   . Obsessive-compulsive disorder   . Peripheral vascular disease ([place])   . Prediabetes   . Respiratory failure requiring intubation ([place])   . Restless leg syndrome   . Shortness of breath   . Sleep apnea   . Swallowing difficulty   . Thrombocytopenia ([place]) 09/15/2019  . Trigger thumb of left hand 01/2018  . Trigger thumb of right hand     Past Surgical History:  Procedure Laterality Date  . ABDOMINAL HYSTERECTOMY  06/2016   complete  . APPLICATION OF WOUND VAC Left 04/27/2019   Procedure: APPLICATION OF WOUND VAC LEFT GROIN;  Surgeon: Waynetta Sandy, MD;  Location: [place];  Service: Vascular;  Laterality: Left;  . AV FISTULA PLACEMENT Left 11/24/2018   Procedure: ARTERIOVENOUS (AV) FISTULA CREATION LEFT SFA TO LEFT FEMORAL VEIN;  Surgeon: Waynetta Sandy, MD;  Location: [place];  Service: Vascular;  Laterality: Left;  . BACK SURGERY    . CHOLECYSTECTOMY    . COLONOSCOPY WITH PROPOFOL N/A 11/22/2019   Procedure: COLONOSCOPY WITH PROPOFOL;  Surgeon: [name], MD;  Location: [facility] ENDOSCOPY;  Service: Gastroenterology;  Laterality: N/A;  . FEMORAL ARTERY EXPLORATION  03/30/2019   Procedure: Left Common Femoral Artery and Vein Exploration;  Surgeon: Waynetta Sandy, MD;  Location: [place];  Service: Vascular;;  . FEMORAL-FEMORAL BYPASS GRAFT Left 11/24/2018   Procedure: BYPASS GRAFT FEMORAL-FEMORAL VENOUS LEFT TO RIGHT PALMA PROCEDURE USING CRYOVEIN;  Surgeon: Waynetta Sandy, MD;  Location: [place];  Service: Vascular;  Laterality: Left;  . GROIN DEBRIDEMENT Left 04/27/2019   Procedure: GROIN DEBRIDEMENT;  Surgeon: Waynetta Sandy, MD;  Location: [place];  Service: Vascular;  Laterality: Left;  . INSERTION OF ILIAC STENT  03/30/2019   Procedure: Stent of left common, external iliac veins and left common femoral vein;  Surgeon: Waynetta Sandy, MD;  Location: [place];  Service: Vascular;;  . LOWER EXTREMITY VENOGRAPHY N/A 08/17/2018   Procedure: LOWER EXTREMITY VENOGRAPHY - Central Venogram;  Surgeon: Waynetta Sandy, MD;  Location: [place] CV LAB;  Service: Cardiovascular;  Laterality: N/A;  . LOWER EXTREMITY VENOGRAPHY Bilateral 03/09/2019   Procedure: LOWER EXTREMITY VENOGRAPHY;  Surgeon: Waynetta Sandy, MD;  Location: [place] CV LAB;  Service: Cardiovascular;  Laterality: Bilateral;  . LOWER EXTREMITY VENOGRAPHY Left 08/16/2019   Procedure: LOWER EXTREMITY VENOGRAPHY;  Surgeon: Waynetta Sandy, MD;  Location: [place] CV LAB;  Service: Cardiovascular;   Laterality: Left;  . LUMBAR FUSION  11/21/2000   L5-S1  . LUMBAR SPINE SURGERY     x 2 others  . PATCH ANGIOPLASTY Left 03/30/2019   Procedure: Patch Angioplasty of the Left Common Femoral Vein using Venosure Biologic patch;  Surgeon: Waynetta Sandy, MD;  Location: [place];  Service: Vascular;  Laterality: Left;  . PERIPHERAL VASCULAR INTERVENTION Left 08/16/2019   Procedure: PERIPHERAL VASCULAR INTERVENTION;  Surgeon: Waynetta Sandy, MD;  Location: [place] CV LAB;  Service: Cardiovascular;  Laterality: Left;  common femoral/femoral vein stent  . TRIGGER FINGER RELEASE Right 12/01/2017   Procedure: RELEASE TRIGGER FINGER/A-1 PULLEY RIGHT THUMB;  Surgeon: Leanora Cover, MD;  Location: [place];  Service: Orthopedics;  Laterality: Right;  . TRIGGER FINGER RELEASE Left 01/26/2018   Procedure: LEFT TRIGGER THUMB RELEASE;  Surgeon: Leanora Cover, MD;  Location: [place];  Service: Orthopedics;  Laterality: Left;  . ULTRASOUND GUIDANCE FOR VASCULAR ACCESS Right 03/30/2019   Procedure: Ultrasound-guided  cannulation right internal jugular vein;  Surgeon: Waynetta Sandy, MD;  Location: [facility] OR;  Service: Vascular;  Laterality: Right;    Family Psychiatric History: See below  Family History:  Family History  Problem Relation Age of Onset  . Heart disease Mother   . Hyperlipidemia Mother   . Hypertension Mother   . Bipolar disorder Mother   . Stroke Mother   . Depression Mother   . Sleep apnea Mother   . Obesity Mother   . Diabetes Father   . Heart disease Father   . Hyperlipidemia Father   . Hypertension Father   . Sleep apnea Father   . Obesity Father   . Drug abuse Daughter   . ADD / ADHD Daughter   . Drug abuse Daughter   . Anxiety disorder Daughter   . Bipolar disorder Daughter   . Hypertension Sister   . Hypertension Brother   . Hyperlipidemia Brother   . Heart disease Brother   . Bipolar disorder Maternal Aunt   .  Suicidality Maternal Aunt     Social History:  Social History   Socioeconomic History  . Marital status: Divorced    Spouse name: Not on file  . Number of children: 3  . Years of education: Not on file  . Highest education level: Not on file  Occupational History  . Not on file  Tobacco Use  . Smoking status: Never Smoker  . Smokeless tobacco: Never Used  Vaping Use  . Vaping Use: Never used  Substance and Sexual Activity  . Alcohol use: No  . Drug use: No  . Sexual activity: Not Currently    Partners: Male    Birth control/protection: Surgical  Other Topics Concern  . Not on file  Social History Narrative  . Not on file   Social Determinants of Health   Financial Resource Strain:   . Difficulty of Paying Living Expenses:   Food Insecurity:   . Worried About Charity fundraiser in the Last Year:   . Arboriculturist in the Last Year:   Transportation Needs:   . Film/video editor (Medical):   Marland Kitchen Lack of Transportation (Non-Medical):   Physical Activity:   . Days of Exercise per Week:   . Minutes of Exercise per Session:   Stress:   . Feeling of Stress :   Social Connections:   . Frequency of Communication with Friends and Family:   . Frequency of Social Gatherings with Friends and Family:   . Attends Religious Services:   . Active Member of Clubs or Organizations:   . Attends Archivist Meetings:   Marland Kitchen Marital Status:     Allergies:  Allergies  Allergen Reactions  . Aripiprazole Other (See Comments)    BECOMES  VIOLENT   . Seroquel [Quetiapine Fumarate] Other (See Comments)    BECOMES VIOLENT  . Chlorpromazine Other (See Comments)    SEVERE ANXIETY   . Gabapentin Other (See Comments)    NIGHTMARES     Metabolic Disorder Labs: Lab Results  Component Value Date   HGBA1C 6.2 (H) 03/29/2020   MPG 134.11 03/26/2019   No results found for: PROLACTIN Lab Results  Component Value Date   CHOL 203 (H) 03/29/2020   TRIG 296 (H) 03/29/2020    HDL 37 (L) 03/29/2020   CHOLHDL 5.0 (H) 10/25/2019   LDLCALC 114 (H) 03/29/2020   LDLCALC 127 (H) 10/25/2019   Lab Results  Component Value Date  TSH 0.275 (L) 03/29/2020   TSH 11.100 (H) 01/20/2020    Therapeutic Level Labs: No results found for: LITHIUM Lab Results  Component Value Date   VALPROATE 61.2 03/23/2014   No components found for:  CBMZ  Current Medications: Current Outpatient Medications  Medication Sig Dispense Refill  . ALPRAZolam (XANAX) 1 MG tablet Take 1 tablet (1 mg total) by mouth 2 (two) times daily as needed for anxiety. 60 tablet 2  . amphetamine-dextroamphetamine (ADDERALL) 30 MG tablet Take 1 tablet by mouth 2 (two) times daily. 60 tablet 0  . amphetamine-dextroamphetamine (ADDERALL) 30 MG tablet Take 1 tablet by mouth 2 (two) times daily. 60 tablet 0  . amphetamine-dextroamphetamine (ADDERALL) 30 MG tablet Take 1 tablet by mouth 2 (two) times daily. 60 tablet 0  . aspirin EC 81 MG tablet Take 81 mg by mouth daily.    Marland Kitchen atorvastatin (LIPITOR) 20 MG tablet Take 1 tablet (20 mg total) by mouth daily. 90 tablet 3  . cefdinir (OMNICEF) 300 MG capsule Take by mouth.    . cetirizine (ZYRTEC) 10 MG tablet Take 1 tablet (10 mg total) by mouth daily. 90 tablet 1  . cyclobenzaprine (FLEXERIL) 10 MG tablet Take 1 tablet (10 mg total) by mouth at bedtime. 30 tablet 1  . cycloSPORINE (RESTASIS) 0.05 % ophthalmic emulsion Place 1 drop into both eyes 2 (two) times daily.    Marland Kitchen FLUoxetine (PROZAC) 40 MG capsule TAKE 1 TABLET BY MOUTH DAILY 90 capsule 2  . GARLIC PO Take 6,045 mg by mouth.     Marland Kitchen HYDROcodone-acetaminophen (NORCO) 10-325 MG tablet Take 1 tablet by mouth every 6 (six) hours as needed for severe pain. Must last 30 days. 120 tablet 0  . [START ON 06/15/2020] HYDROcodone-acetaminophen (NORCO) 10-325 MG tablet Take 1 tablet by mouth every 6 (six) hours as needed for severe pain. Must last 30 days. 120 tablet 0  . levothyroxine (SYNTHROID) 150 MCG tablet Take 1  tablet (150 mcg total) by mouth 3 (three) times a week. 30 tablet 0  . levothyroxine (SYNTHROID) 175 MCG tablet Take 1 tablet (175 mcg total) by mouth 4 (four) times a week.    . linaclotide (LINZESS) 145 MCG CAPS capsule Take 1 capsule (145 mcg total) by mouth daily. 90 capsule 3  . lisinopril (ZESTRIL) 20 MG tablet TAKE 1 TABLET BY MOUTH DAILY 90 tablet 0  . Magnesium 250 MG TABS Take 250 mg by mouth.    . Multiple Vitamin (MULTIVITAMIN) capsule Take 1 capsule by mouth daily.    . Olopatadine HCl 0.2 % SOLN Apply 1 drop to eye daily.    . Omega-3 Fatty Acids (OMEGA-3 FISH OIL PO) Take 600 mg by mouth.    . pantoprazole (PROTONIX) 40 MG tablet Take 1 tablet (40 mg total) by mouth daily. 90 tablet 3  . potassium chloride (KLOR-CON) 10 MEQ tablet Take 1 tablet (10 mEq total) by mouth 3 (three) times daily. 270 tablet 3  . pregabalin (LYRICA) 100 MG capsule Take 100 mg by mouth 4 (four) times daily.    Marland Kitchen rOPINIRole (REQUIP) 2 MG tablet Take 1 tablet (2 mg total) by mouth 2 (two) times daily. 180 tablet 3  . vitamin C (ASCORBIC ACID) 500 MG tablet Take 500 mg by mouth at bedtime.    . Vitamin D, Ergocalciferol, (DRISDOL) 1.25 MG (50000 UNIT) CAPS capsule Take 1 capsule (50,000 Units total) by mouth every 7 (seven) days. 4 capsule 0  . warfarin (COUMADIN) 5 MG tablet  Take 1-2 tablets (5-10 mg total) by mouth See admin instructions. Take 1 and 1/2 tablets on Tuesday and Thursday then take 1 tablet all the other days (Patient taking differently: Take 5 mg by mouth one time only at 4 PM. ) 60 tablet 1   No current facility-administered medications for this visit.     Musculoskeletal: Strength & Muscle Tone: decreased Gait & Station: normal Patient leans: N/A  Psychiatric Specialty Exam: Review of Systems  Musculoskeletal: Positive for arthralgias and back pain.  All other systems reviewed and are negative.   There were no vitals taken for this visit.There is no height or weight on file to  calculate BMI.  General Appearance: Casual and Fairly Groomed  Eye Contact:  Good  Speech:  Clear and Coherent  Volume:  Normal  Mood:  Euthymic  Affect:  Appropriate and Congruent  Thought Process:  Goal Directed  Orientation:  Full (Time, Place, and Person)  Thought Content: Rumination   Suicidal Thoughts:  No  Homicidal Thoughts:  No  Memory:  Immediate;   Good Recent;   Good Remote;   Good  Judgement:  Good  Insight:  Fair  Psychomotor Activity:  Decreased  Concentration:  Concentration: Good and Attention Span: Good  Recall:  Good  Fund of Knowledge: Good  Language: Good  Akathisia:  No  Handed:  Right  AIMS (if indicated): not done  Assets:  Communication Skills Desire for Improvement Resilience Social Support Talents/Skills  ADL's:  Intact  Cognition: WNL  Sleep:  Good   Screenings: PHQ2-9     Office Visit from 05/16/2020 in [place] Office Visit from 04/24/2020 in [place] Office Visit from 03/29/2020 in [place] Office Visit from 01/20/2020 in [place] Visit from 10/25/2019 in [place]  PHQ-2 Total Score 0 0 6 4 4   PHQ-9 Total Score -- -- 19 13 19        Assessment and Plan: This patient is a 54 year old female with a history of depression anxiety and ADD she seems to be doing better on the Prozac 40 mg daily than she did on Cymbalta.  This will be continued along with Xanax 1 mg twice daily as needed for anxiety and Adderall 30 mg twice daily for ADD.  She will return to see me in 3 months   [name], MD 05/18/2020, 11:07 AM

## 2020-05-19 DIAGNOSIS — E039 Hypothyroidism, unspecified: Secondary | ICD-10-CM | POA: Diagnosis not present

## 2020-05-19 LAB — T4: T4, Total: 9.2 ug/dL (ref 4.5–12.0)

## 2020-05-19 LAB — T3: T3, Total: 100 ng/dL (ref 71–180)

## 2020-05-19 LAB — TSH: TSH: 0.107 u[IU]/mL — ABNORMAL LOW (ref 0.450–4.500)

## 2020-05-20 DIAGNOSIS — E039 Hypothyroidism, unspecified: Secondary | ICD-10-CM | POA: Diagnosis not present

## 2020-05-21 DIAGNOSIS — E039 Hypothyroidism, unspecified: Secondary | ICD-10-CM | POA: Diagnosis not present

## 2020-05-22 DIAGNOSIS — E039 Hypothyroidism, unspecified: Secondary | ICD-10-CM | POA: Diagnosis not present

## 2020-05-23 ENCOUNTER — Other Ambulatory Visit: Payer: Self-pay

## 2020-05-23 ENCOUNTER — Ambulatory Visit (INDEPENDENT_AMBULATORY_CARE_PROVIDER_SITE_OTHER): Payer: Medicaid Other

## 2020-05-23 DIAGNOSIS — Z86718 Personal history of other venous thrombosis and embolism: Secondary | ICD-10-CM

## 2020-05-23 DIAGNOSIS — E039 Hypothyroidism, unspecified: Secondary | ICD-10-CM | POA: Diagnosis not present

## 2020-05-23 LAB — POCT INR
INR: 2.4 (ref 2.0–3.0)
PT: 29.2

## 2020-05-23 NOTE — Patient Instructions (Signed)
Continue 5mg  a day.  Recheck in four weeks.

## 2020-05-23 NOTE — Progress Notes (Signed)
Chief Complaint:   OBESITY Jane [name] is here to discuss her progress with her obesity treatment plan along with follow-up of her obesity related diagnoses. Jane [name] is on the Category 4 Plan and states she is following her eating plan approximately 25% of the time. Jane [name] states she is in the pool and she mowed the yard for 3 hours 3 times per week.  Today's visit was #: 4 Starting weight: 283 lbs Starting date: 03/29/2020 Today's weight: 264 lbs Today's date: 05/18/2020 Total lbs lost to date: 19 Total lbs lost since last in-office visit: 0  Interim History: Jane [name] went to the pool and she did do some physical activity in her yard. She hasn't been packing food when going to the pool. She has gotten out of the habit of prepping. She has a birthday party on Saturday and going to [facility] in 6 days and she is going to [name] in 2 weeks.  Subjective:   1. Other specified hypothyroidism Jane [name] had a recent TSH of 0.275. She denies cold/heat intolerance or palpitations.  2. Hypertension associated with diabetes (Jane [name]) Jane [name]'s blood pressure is low today. She denies chest pain, chest pressure, or headaches.  Assessment/Plan:   1. Other specified hypothyroidism Patient with long-standing hypothyroidism, on levothyroxine therapy. Jane [name] appears euthyroid. We will check labs today. Orders and follow up as documented in patient record.  Counseling . Good thyroid control is important for overall health. Supratherapeutic thyroid levels are dangerous and will not improve weight loss results. . The correct way to take levothyroxine is fasting, with water, separated by at least 30 minutes from breakfast, and separated by more than 4 hours from calcium, iron, multivitamins, acid reflux medications (PPIs).   - T4 - TSH - T3  2. Hypertension associated with diabetes (Jane [name]) Jane [name] is working on healthy weight loss and exercise to improve blood pressure control. We will watch for signs of  hypotension as she continues her lifestyle modifications. Jane [name] is to check her blood pressure at home 2-3 times per week, and if blood pressure is low at her next appointment we will decrease lisinopril.  3. Class 3 severe obesity with serious comorbidity and body mass index (BMI) of 40.0 to 44.9 in adult, unspecified obesity type (HCC) Jane [name] is currently in the action stage of change. As such, her goal is to continue with weight loss efforts. She has agreed to the Category 4 Plan.   Exercise goals: All adults should avoid inactivity. Some physical activity is better than none, and adults who participate in any amount of physical activity gain some health benefits.  Behavioral modification strategies: increasing lean protein intake, increasing vegetables, meal planning and cooking strategies and keeping healthy foods in the home.  Jane [name] has agreed to follow-up with our clinic in 2 to 3 weeks. She was informed of the importance of frequent follow-up visits to maximize her success with intensive lifestyle modifications for her multiple health conditions.   Jane [name] was informed we would discuss her lab results at her next visit unless there is a critical issue that needs to be addressed sooner. Jane [name] agreed to keep her next visit at the agreed upon time to discuss these results.  Objective:   Blood pressure 96/60, pulse 63, temperature 97.9 F (36.6 C), temperature source Oral, height 5\' 8"  (1.727 m), weight 264 lb (119.7 kg), SpO2 94 %. Body mass index is 40.14 kg/m.  General: Cooperative, alert, well developed, in no acute distress. HEENT: Conjunctivae and lids unremarkable. Cardiovascular:  Regular rhythm.  Lungs: Normal work of breathing. Neurologic: No focal deficits.   Lab Results  Component Value Date   CREATININE 1.24 (H) 04/13/2020   BUN 44 (H) 04/13/2020   NA 142 04/13/2020   K 4.3 04/13/2020   CL 107 04/13/2020   CO2 23 04/13/2020   Lab Results  Component Value Date   ALT  17 04/13/2020   AST 13 (L) 04/13/2020   ALKPHOS 86 04/13/2020   BILITOT 0.3 04/13/2020   Lab Results  Component Value Date   HGBA1C 6.2 (H) 03/29/2020   HGBA1C 6.3 (H) 10/25/2019   HGBA1C 6.3 (H) 03/26/2019   HGBA1C 7.5 11/18/2018   Lab Results  Component Value Date   INSULIN 21.5 03/29/2020   Lab Results  Component Value Date   TSH 0.107 (L) 05/18/2020   Lab Results  Component Value Date   CHOL 203 (H) 03/29/2020   HDL 37 (L) 03/29/2020   LDLCALC 114 (H) 03/29/2020   TRIG 296 (H) 03/29/2020   CHOLHDL 5.0 (H) 10/25/2019   Lab Results  Component Value Date   WBC 4.8 04/13/2020   HGB 13.1 04/13/2020   HCT 40.8 04/13/2020   MCV 96.5 04/13/2020   PLT 149 (L) 04/13/2020   Lab Results  Component Value Date   IRON 97 04/13/2020   TIBC 353 04/13/2020   FERRITIN 40 04/13/2020   Attestation Statements:   Reviewed by clinician on day of visit: allergies, medications, problem list, medical history, surgical history, family history, social history, and previous encounter notes.  Time spent on visit including pre-visit chart review and post-visit care and charting was 25 minutes.    I, Trixie Dredge, am acting as transcriptionist for Coralie Common, MD.  I have reviewed the above documentation for accuracy and completeness, and I agree with the above. - Jinny Blossom, MD

## 2020-05-24 ENCOUNTER — Other Ambulatory Visit: Payer: Self-pay | Admitting: Family Medicine

## 2020-05-24 DIAGNOSIS — E039 Hypothyroidism, unspecified: Secondary | ICD-10-CM | POA: Diagnosis not present

## 2020-05-24 NOTE — Telephone Encounter (Signed)
Requested medication (s) are due for refill today: no  Requested medication (s) are on the active medication list: yes  Last refill:  05/04/2020  Future visit scheduled:  yes  Notes to clinic:  this refill cannot be delegated    Requested Prescriptions  Pending Prescriptions Disp Refills   warfarin (COUMADIN) 5 MG tablet [Pharmacy Med Name: warfarin 5 mg tablet] 60 tablet 1    Sig: TAKE 1 AND 1/2 TABLETS BY MOUTH ON TUEDAYS AND THURSDAYS, THEN TAKE 1 TABLET DAILY ON ALL OTHER DAYS      Hematology:  Anticoagulants - warfarin Failed - 05/24/2020  8:02 AM      Failed - This refill cannot be delegated      Failed - If the patient is managed by Coumadin Clinic - route to their Pool. If not, forward to the provider.      Passed - INR in normal range and within 30 days    INR  Date Value Ref Range Status  05/23/2020 2.4 2.0 - 3.0 Final    Comment:    Continue 5mg  a day.  Recheck in four weeks.   10/25/2019 2.6 (H) 0.9 - 1.2 Final    Comment:    Reference interval is for non-anticoagulated patients. Suggested INR therapeutic range for Vitamin K antagonist therapy:    Standard Dose (moderate intensity                   therapeutic range):       2.0 - 3.0    Higher intensity therapeutic range       2.5 - 3.5           Passed - Valid encounter within last 3 months    Recent Outpatient Visits           1 month ago Essential hypertension   [facility] [place], Dionne Bucy, MD   2 months ago Displacement of lumbar intervertebral disc without myelopathy   [facility] [place], Clearnce Sorrel, PA-C   2 months ago Type 2 diabetes mellitus with stage 3a chronic kidney disease, without long-term current use of insulin [facility])   [facility] [place], Dionne Bucy, MD   3 months ago Acquired hypothyroidism   [facility] [place], Dionne Bucy, MD   4 months ago Encounter for annual physical exam   [facility],  Dionne Bucy, MD       Future Appointments             In 5 months [place], Dionne Bucy, MD [facility], [place]

## 2020-05-25 DIAGNOSIS — E039 Hypothyroidism, unspecified: Secondary | ICD-10-CM | POA: Diagnosis not present

## 2020-05-26 ENCOUNTER — Encounter: Payer: Self-pay | Admitting: Orthopedic Surgery

## 2020-05-26 ENCOUNTER — Ambulatory Visit: Payer: Medicaid Other | Admitting: Orthopedic Surgery

## 2020-05-26 ENCOUNTER — Other Ambulatory Visit: Payer: Self-pay

## 2020-05-26 VITALS — BP 130/70 | HR 74 | Ht 68.0 in | Wt 267.0 lb

## 2020-05-26 DIAGNOSIS — G8929 Other chronic pain: Secondary | ICD-10-CM | POA: Diagnosis not present

## 2020-05-26 DIAGNOSIS — Z6841 Body Mass Index (BMI) 40.0 and over, adult: Secondary | ICD-10-CM

## 2020-05-26 DIAGNOSIS — M25562 Pain in left knee: Secondary | ICD-10-CM

## 2020-05-26 DIAGNOSIS — E039 Hypothyroidism, unspecified: Secondary | ICD-10-CM | POA: Diagnosis not present

## 2020-05-26 NOTE — Progress Notes (Addendum)
Jane [name]  05/26/2020  Body mass index is 40.6 kg/m.   S:  Chief Complaint  Patient presents with  . Knee Pain    left knee / injection did not help "at all"   Jane [name] had a revascularization procedure in her lower extremity she had consistent pain in her left knee had an injection for bursitis and osteoarthritis perhaps although the plain film did not show degenerative changes  At this point it is unclear to why she has pain in her knee perhaps MRI necessary to clarify O:  The patient meets the AMA guidelines for Morbid (severe) obesity with a BMI > 40.0 and I have recommended weight loss.  BP (!) 130/70   Pulse 74   Ht 5\' 8"  (1.727 m)   Wt (!) 267 lb (121.1 kg)   LMP  (LMP Unknown)   BMI 40.60 kg/m    Past Medical History:  Diagnosis Date  . ADD (attention deficit disorder)   . Anxiety   . Back pain   . Bilateral swelling of feet   . Bipolar 1 disorder ([place])   . Bipolar 1 disorder ([place])   . Bipolar disorder ([place])   . Chewing difficulty   . Chronic fatigue syndrome   . Chronic kidney disease    Stage 3 kidney disease;dx by Dr. Sinda Du.   . Constipation   . Depression   . Diabetes mellitus without complication ([place])   . Dyspnea    with exertion  . GERD (gastroesophageal reflux disease)   . Headache    migraines  . High cholesterol   . History of blood clots   . History of DVT (deep vein thrombosis)    left leg  . Hypertension    states under control with meds., has been on med. x 2 years  . Hypothyroidism   . Joint pain   . Neuropathy   . Obsessive-compulsive disorder   . Peripheral vascular disease ([place])   . Prediabetes   . Respiratory failure requiring intubation ([place])   . Restless leg syndrome   . Shortness of breath   . Sleep apnea   . Swallowing difficulty   . Thrombocytopenia ([place]) 09/15/2019  . Trigger thumb of left hand 01/2018  . Trigger thumb of right hand    Past Surgical History:  Procedure Laterality Date  . ABDOMINAL  HYSTERECTOMY  06/2016   complete  . APPLICATION OF WOUND VAC Left 04/27/2019   Procedure: APPLICATION OF WOUND VAC LEFT GROIN;  Surgeon: Waynetta Sandy, MD;  Location: [place];  Service: Vascular;  Laterality: Left;  . AV FISTULA PLACEMENT Left 11/24/2018   Procedure: ARTERIOVENOUS (AV) FISTULA CREATION LEFT SFA TO LEFT FEMORAL VEIN;  Surgeon: Waynetta Sandy, MD;  Location: [place];  Service: Vascular;  Laterality: Left;  . BACK SURGERY    . CHOLECYSTECTOMY    . COLONOSCOPY WITH PROPOFOL N/A 11/22/2019   Procedure: COLONOSCOPY WITH PROPOFOL;  Surgeon: [name], MD;  Location: [facility];  Service: Gastroenterology;  Laterality: N/A;  . FEMORAL ARTERY EXPLORATION  03/30/2019   Procedure: Left Common Femoral Artery and Vein Exploration;  Surgeon: Waynetta Sandy, MD;  Location: [place];  Service: Vascular;;  . FEMORAL-FEMORAL BYPASS GRAFT Left 11/24/2018   Procedure: BYPASS GRAFT FEMORAL-FEMORAL VENOUS LEFT TO RIGHT PALMA PROCEDURE USING CRYOVEIN;  Surgeon: Waynetta Sandy, MD;  Location: [place];  Service: Vascular;  Laterality: Left;  . GROIN DEBRIDEMENT Left 04/27/2019   Procedure: GROIN DEBRIDEMENT;  Surgeon: Servando Snare  Harrell Gave, MD;  Location: [place];  Service: Vascular;  Laterality: Left;  . INSERTION OF ILIAC STENT  03/30/2019   Procedure: Stent of left common, external iliac veins and left common femoral vein;  Surgeon: Waynetta Sandy, MD;  Location: [place];  Service: Vascular;;  . LOWER EXTREMITY VENOGRAPHY N/A 08/17/2018   Procedure: LOWER EXTREMITY VENOGRAPHY - Central Venogram;  Surgeon: Waynetta Sandy, MD;  Location: [place] [facility];  Service: Cardiovascular;  Laterality: N/A;  . LOWER EXTREMITY VENOGRAPHY Bilateral 03/09/2019   Procedure: LOWER EXTREMITY VENOGRAPHY;  Surgeon: Waynetta Sandy, MD;  Location: [place] [facility];  Service: Cardiovascular;  Laterality: Bilateral;  . LOWER EXTREMITY VENOGRAPHY Left  08/16/2019   Procedure: LOWER EXTREMITY VENOGRAPHY;  Surgeon: Waynetta Sandy, MD;  Location: [place] [facility];  Service: Cardiovascular;  Laterality: Left;  . LUMBAR FUSION  11/21/2000   L5-S1  . LUMBAR SPINE SURGERY     x 2 others  . PATCH ANGIOPLASTY Left 03/30/2019   Procedure: Patch Angioplasty of the Left Common Femoral Vein using Venosure Biologic patch;  Surgeon: Waynetta Sandy, MD;  Location: [place];  Service: Vascular;  Laterality: Left;  . PERIPHERAL VASCULAR INTERVENTION Left 08/16/2019   Procedure: PERIPHERAL VASCULAR INTERVENTION;  Surgeon: Waynetta Sandy, MD;  Location: [place] [facility];  Service: Cardiovascular;  Laterality: Left;  common femoral/femoral vein stent  . TRIGGER FINGER RELEASE Right 12/01/2017   Procedure: RELEASE TRIGGER FINGER/A-1 PULLEY RIGHT THUMB;  Surgeon: Leanora Cover, MD;  Location: [place];  Service: Orthopedics;  Laterality: Right;  . TRIGGER FINGER RELEASE Left 01/26/2018   Procedure: LEFT TRIGGER THUMB RELEASE;  Surgeon: Leanora Cover, MD;  Location: [place];  Service: Orthopedics;  Laterality: Left;  . ULTRASOUND GUIDANCE FOR VASCULAR ACCESS Right 03/30/2019   Procedure: Ultrasound-guided cannulation right internal jugular vein;  Surgeon: Waynetta Sandy, MD;  Location: [place];  Service: Vascular;  Laterality: Right;     Physical Exam  Prior exam we noted medial joint line tenderness patellofemoral pain some swelling and decreased range of motion   MEDICAL DECISION MAKING  Encounter Diagnoses  Name Primary?  . Chronic pain of left knee Yes  . Body mass index 40.0-44.9, adult ([place])   . Morbid obesity ([place])       IMAGING:  Prior x-ray did not show x-ray related arthritis   Outside records reviewed: no  MANAGEMENT   At this point she has chronic pain going back to 2020 October she has had an injection she has had an adequate course of activity modification and  she has not improved her complaints or medial joint line pain pressure in her kneecap and decreased range of motion with swelling  At this point after the revascularization procedures that she had her current warfarin therapy her weight we would like to pursue MRI to evaluate the knee that way.  Chronic pain, MRI  No orders of the defined types were placed in this encounter.     Arther Abbott, MD  05/26/2020 12:07 PM

## 2020-05-26 NOTE — Patient Instructions (Signed)
MRI scan has been ordered for you, we will get approval for the scan if needed, then Forestine Na will call you with appointment. After you get the appointment for the MRI, please call us to schedule appointment for a follow up.

## 2020-05-27 DIAGNOSIS — E039 Hypothyroidism, unspecified: Secondary | ICD-10-CM | POA: Diagnosis not present

## 2020-05-28 DIAGNOSIS — E039 Hypothyroidism, unspecified: Secondary | ICD-10-CM | POA: Diagnosis not present

## 2020-05-29 DIAGNOSIS — E039 Hypothyroidism, unspecified: Secondary | ICD-10-CM | POA: Diagnosis not present

## 2020-05-30 DIAGNOSIS — E039 Hypothyroidism, unspecified: Secondary | ICD-10-CM | POA: Diagnosis not present

## 2020-05-31 DIAGNOSIS — E039 Hypothyroidism, unspecified: Secondary | ICD-10-CM | POA: Diagnosis not present

## 2020-06-01 ENCOUNTER — Other Ambulatory Visit: Payer: Self-pay | Admitting: Vascular Surgery

## 2020-06-01 DIAGNOSIS — I871 Compression of vein: Secondary | ICD-10-CM

## 2020-06-01 DIAGNOSIS — E039 Hypothyroidism, unspecified: Secondary | ICD-10-CM | POA: Diagnosis not present

## 2020-06-02 DIAGNOSIS — E039 Hypothyroidism, unspecified: Secondary | ICD-10-CM | POA: Diagnosis not present

## 2020-06-03 DIAGNOSIS — E039 Hypothyroidism, unspecified: Secondary | ICD-10-CM | POA: Diagnosis not present

## 2020-06-04 DIAGNOSIS — E039 Hypothyroidism, unspecified: Secondary | ICD-10-CM | POA: Diagnosis not present

## 2020-06-05 DIAGNOSIS — E039 Hypothyroidism, unspecified: Secondary | ICD-10-CM | POA: Diagnosis not present

## 2020-06-06 DIAGNOSIS — E039 Hypothyroidism, unspecified: Secondary | ICD-10-CM | POA: Diagnosis not present

## 2020-06-07 DIAGNOSIS — E039 Hypothyroidism, unspecified: Secondary | ICD-10-CM | POA: Diagnosis not present

## 2020-06-08 ENCOUNTER — Other Ambulatory Visit: Payer: Self-pay

## 2020-06-08 ENCOUNTER — Encounter (INDEPENDENT_AMBULATORY_CARE_PROVIDER_SITE_OTHER): Payer: Self-pay | Admitting: Family Medicine

## 2020-06-08 ENCOUNTER — Ambulatory Visit (INDEPENDENT_AMBULATORY_CARE_PROVIDER_SITE_OTHER): Payer: Medicaid Other | Admitting: Family Medicine

## 2020-06-08 VITALS — BP 104/62 | HR 68 | Temp 98.1°F | Ht 68.0 in | Wt 258.0 lb

## 2020-06-08 DIAGNOSIS — Z6839 Body mass index (BMI) 39.0-39.9, adult: Secondary | ICD-10-CM | POA: Diagnosis not present

## 2020-06-08 DIAGNOSIS — E038 Other specified hypothyroidism: Secondary | ICD-10-CM

## 2020-06-08 DIAGNOSIS — I10 Essential (primary) hypertension: Secondary | ICD-10-CM

## 2020-06-08 DIAGNOSIS — E039 Hypothyroidism, unspecified: Secondary | ICD-10-CM | POA: Diagnosis not present

## 2020-06-08 MED ORDER — LISINOPRIL 20 MG PO TABS: 10.0000 mg | ORAL_TABLET | Freq: Every day | ORAL | 0 refills | Status: DC

## 2020-06-08 MED ORDER — LEVOTHYROXINE SODIUM 150 MCG PO TABS: 150.0000 ug | ORAL_TABLET | Freq: Every day | ORAL | 0 refills | Status: DC

## 2020-06-09 DIAGNOSIS — E039 Hypothyroidism, unspecified: Secondary | ICD-10-CM | POA: Diagnosis not present

## 2020-06-10 DIAGNOSIS — E039 Hypothyroidism, unspecified: Secondary | ICD-10-CM | POA: Diagnosis not present

## 2020-06-11 DIAGNOSIS — E039 Hypothyroidism, unspecified: Secondary | ICD-10-CM | POA: Diagnosis not present

## 2020-06-12 DIAGNOSIS — E039 Hypothyroidism, unspecified: Secondary | ICD-10-CM | POA: Diagnosis not present

## 2020-06-12 NOTE — Progress Notes (Signed)
Chief Complaint:   OBESITY Jane [name] is here to discuss her progress with her obesity treatment plan along with follow-up of her obesity related diagnoses. Jane [name] is on the Category 4 Plan and states she is following her eating plan approximately 25% of the time. Jane [name] states she is in the pool for 1 hour 3 times per week, and yard work for 3 hours 2 times per week.  Today's visit was #: 5 Starting weight: 283 lbs Starting date: 03/29/2020 Today's weight: 258 lbs Today's date: 06/08/2020 Total lbs lost to date: 25 Total lbs lost since last in-office visit: 6  Interim History: Jane [name] didn't feel she had done well in terms of following the plan but she has increased her activity. She is trying to go to the pool 3 times per week. She plans to start adding a 1 mile walk on the track while with her grandson at football practice. She is going to try to recommit to Category 4.  Subjective:   1. Other specified hypothyroidism Jane [name]'s last TSH was 0.107, and T3 and T4 were within normal limits. She is on combination levothyroxine 150 mcg PO 3 times per week, then 175 mcg PO 4 times per week. She denies cold/heat intolerance or palpitations.  2. Essential hypertension Jane [name]'s blood pressure is well controlled today. She denies chest pain, chest pressure, or headaches.  Assessment/Plan:   1. Other specified hypothyroidism Patient with long-standing hypothyroidism, and Jane [name] agreed to change to levothyroxine 150 mcg PO daily with no refills. She appears euthyroid. Orders and follow up as documented in patient record.  Counseling . Good thyroid control is important for overall health. Supratherapeutic thyroid levels are dangerous and will not improve weight loss results. . The correct way to take levothyroxine is fasting, with water, separated by at least 30 minutes from breakfast, and separated by more than 4 hours from calcium, iron, multivitamins, acid reflux medications (PPIs).   -  levothyroxine (SYNTHROID) 150 MCG tablet; Take 1 tablet (150 mcg total) by mouth daily before breakfast.  Dispense: 30 tablet; Refill: 0  2. Essential hypertension Jane [name] is working on healthy weight loss and exercise to improve blood pressure control. We will watch for signs of hypotension as she continues her lifestyle modifications. Jane [name] agreed to decrease lisinopril to 10 mg (she is to cut her pill in 1/2).  - lisinopril (ZESTRIL) 20 MG tablet; Take 0.5 tablets (10 mg total) by mouth daily.  Dispense: 90 tablet; Refill: 0  3. Class 2 severe obesity with serious comorbidity and body mass index (BMI) of 39.0 to 39.9 in adult, unspecified obesity type (HCC) Jane [name] is currently in the action stage of change. As such, her goal is to continue with weight loss efforts. She has agreed to the Category 4 Plan.   Exercise goals: As is.  Behavioral modification strategies: increasing lean protein intake, increasing vegetables, meal planning and cooking strategies, keeping healthy foods in the home and planning for success.  Jane [name] has agreed to follow-up with our clinic in 2 to 3 weeks. She was informed of the importance of frequent follow-up visits to maximize her success with intensive lifestyle modifications for her multiple health conditions.   Objective:   Blood pressure 104/62, pulse 68, temperature 98.1 F (36.7 C), temperature source Oral, height 5\' 8"  (1.727 m), weight 258 lb (117 kg), SpO2 95 %. Body mass index is 39.23 kg/m.  General: Cooperative, alert, well developed, in no acute distress. HEENT: Conjunctivae and lids unremarkable. Cardiovascular: Regular rhythm.  Lungs: Normal work of breathing. Neurologic: No focal deficits.   Lab Results  Component Value Date   CREATININE 1.24 (H) 04/13/2020   BUN 44 (H) 04/13/2020   NA 142 04/13/2020   K 4.3 04/13/2020   CL 107 04/13/2020   CO2 23 04/13/2020   Lab Results  Component Value Date   ALT 17 04/13/2020   AST 13 (L)  04/13/2020   ALKPHOS 86 04/13/2020   BILITOT 0.3 04/13/2020   Lab Results  Component Value Date   HGBA1C 6.2 (H) 03/29/2020   HGBA1C 6.3 (H) 10/25/2019   HGBA1C 6.3 (H) 03/26/2019   HGBA1C 7.5 11/18/2018   Lab Results  Component Value Date   INSULIN 21.5 03/29/2020   Lab Results  Component Value Date   TSH 0.107 (L) 05/18/2020   Lab Results  Component Value Date   CHOL 203 (H) 03/29/2020   HDL 37 (L) 03/29/2020   LDLCALC 114 (H) 03/29/2020   TRIG 296 (H) 03/29/2020   CHOLHDL 5.0 (H) 10/25/2019   Lab Results  Component Value Date   WBC 4.8 04/13/2020   HGB 13.1 04/13/2020   HCT 40.8 04/13/2020   MCV 96.5 04/13/2020   PLT 149 (L) 04/13/2020   Lab Results  Component Value Date   IRON 97 04/13/2020   TIBC 353 04/13/2020   FERRITIN 40 04/13/2020   Attestation Statements:   Reviewed by clinician on day of visit: allergies, medications, problem list, medical history, surgical history, family history, social history, and previous encounter notes.  Time spent on visit including pre-visit chart review and post-visit care and charting was 25 minutes.    I, Trixie Dredge, am acting as transcriptionist for Coralie Common, MD.  I have reviewed the above documentation for accuracy and completeness, and I agree with the above. - Jinny Blossom, MD

## 2020-06-13 DIAGNOSIS — D2330 Other benign neoplasm of skin of unspecified part of face: Secondary | ICD-10-CM | POA: Diagnosis not present

## 2020-06-13 DIAGNOSIS — L905 Scar conditions and fibrosis of skin: Secondary | ICD-10-CM | POA: Diagnosis not present

## 2020-06-13 DIAGNOSIS — E039 Hypothyroidism, unspecified: Secondary | ICD-10-CM | POA: Diagnosis not present

## 2020-06-13 DIAGNOSIS — Z872 Personal history of diseases of the skin and subcutaneous tissue: Secondary | ICD-10-CM | POA: Diagnosis not present

## 2020-06-14 DIAGNOSIS — E039 Hypothyroidism, unspecified: Secondary | ICD-10-CM | POA: Diagnosis not present

## 2020-06-15 ENCOUNTER — Ambulatory Visit (HOSPITAL_COMMUNITY)
Admission: RE | Admit: 2020-06-15 | Discharge: 2020-06-15 | Disposition: A | Discharge status: Home / Self Care | Payer: Medicaid Other | Source: Ambulatory Visit | Attending: Orthopedic Surgery | Admitting: Orthopedic Surgery

## 2020-06-15 ENCOUNTER — Other Ambulatory Visit: Payer: Self-pay

## 2020-06-15 DIAGNOSIS — E039 Hypothyroidism, unspecified: Secondary | ICD-10-CM | POA: Diagnosis not present

## 2020-06-15 DIAGNOSIS — M25562 Pain in left knee: Secondary | ICD-10-CM | POA: Diagnosis not present

## 2020-06-15 DIAGNOSIS — G8929 Other chronic pain: Secondary | ICD-10-CM | POA: Insufficient documentation

## 2020-06-16 DIAGNOSIS — E039 Hypothyroidism, unspecified: Secondary | ICD-10-CM | POA: Diagnosis not present

## 2020-06-17 DIAGNOSIS — E039 Hypothyroidism, unspecified: Secondary | ICD-10-CM | POA: Diagnosis not present

## 2020-06-18 DIAGNOSIS — E039 Hypothyroidism, unspecified: Secondary | ICD-10-CM | POA: Diagnosis not present

## 2020-06-19 DIAGNOSIS — E039 Hypothyroidism, unspecified: Secondary | ICD-10-CM | POA: Diagnosis not present

## 2020-06-20 ENCOUNTER — Other Ambulatory Visit: Payer: Self-pay

## 2020-06-20 ENCOUNTER — Ambulatory Visit (INDEPENDENT_AMBULATORY_CARE_PROVIDER_SITE_OTHER): Payer: Medicaid Other

## 2020-06-20 DIAGNOSIS — Z86718 Personal history of other venous thrombosis and embolism: Secondary | ICD-10-CM | POA: Diagnosis not present

## 2020-06-20 DIAGNOSIS — E039 Hypothyroidism, unspecified: Secondary | ICD-10-CM | POA: Diagnosis not present

## 2020-06-20 LAB — POCT INR
INR: 3.5 — AB (ref 2.0–3.0)
PT: 41.9

## 2020-06-20 NOTE — Progress Notes (Addendum)
FOLLOW UP VISIT : MRI RESULTS   Chief Complaint  Patient presents with  . Knee Pain    fol up MRI left knee pain worse, norco not helpingRESULTS LT KNEE*r/s from 8/20,cx slot    Ht 5\' 8"  (1.727 m)   Wt 263 lb (119.3 kg)   LMP  (LMP Unknown)   BMI 39.99 kg/m   HPI: The patient is here TO DISCUSS THE RESULTS OF an MRI  HPI 54 year old female status post revascularization procedure left lower extremity history of blood clots had an MRI for persistent pain left knee status post injection she is on hydrocodone 10 mg every 4 to 6 continues to complain of fairly severe left knee pain decreased range of motion occasional swelling and a feeling that the knee will give way sent for MRI here for follow-up ROS  Past Medical History:  Diagnosis Date  . ADD (attention deficit disorder)   . Anxiety   . Back pain   . Bilateral swelling of feet   . Bipolar 1 disorder ([place])   . Bipolar 1 disorder ([place])   . Bipolar disorder ([place])   . Chewing difficulty   . Chronic fatigue syndrome   . Chronic kidney disease    Stage 3 kidney disease;dx by Dr. Sinda Du.   . Constipation   . Depression   . Diabetes mellitus without complication ([place])   . Dyspnea    with exertion  . GERD (gastroesophageal reflux disease)   . Headache    migraines  . High cholesterol   . History of blood clots   . History of DVT (deep vein thrombosis)    left leg  . Hypertension    states under control with meds., has been on med. x 2 years  . Hypothyroidism   . Joint pain   . Neuropathy   . Obsessive-compulsive disorder   . Peripheral vascular disease ([place])   . Prediabetes   . Respiratory failure requiring intubation ([place])   . Restless leg syndrome   . Shortness of breath   . Sleep apnea   . Swallowing difficulty   . Thrombocytopenia ([place]) 09/15/2019  . Trigger thumb of left hand 01/2018  . Trigger thumb of right hand    Past Surgical History:  Procedure Laterality Date  . ABDOMINAL HYSTERECTOMY   06/2016   complete  . APPLICATION OF WOUND VAC Left 04/27/2019   Procedure: APPLICATION OF WOUND VAC LEFT GROIN;  Surgeon: Waynetta Sandy, MD;  Location: [place];  Service: Vascular;  Laterality: Left;  . AV FISTULA PLACEMENT Left 11/24/2018   Procedure: ARTERIOVENOUS (AV) FISTULA CREATION LEFT SFA TO LEFT FEMORAL VEIN;  Surgeon: Waynetta Sandy, MD;  Location: [place];  Service: Vascular;  Laterality: Left;  . BACK SURGERY    . CHOLECYSTECTOMY    . COLONOSCOPY WITH PROPOFOL N/A 11/22/2019   Procedure: COLONOSCOPY WITH PROPOFOL;  Surgeon: [name], MD;  Location: [facility]COPY;  Service: Gastroenterology;  Laterality: N/A;  . FEMORAL ARTERY EXPLORATION  03/30/2019   Procedure: Left Common Femoral Artery and Vein Exploration;  Surgeon: Waynetta Sandy, MD;  Location: [place];  Service: Vascular;;  . FEMORAL-FEMORAL BYPASS GRAFT Left 11/24/2018   Procedure: BYPASS GRAFT FEMORAL-FEMORAL VENOUS LEFT TO RIGHT PALMA PROCEDURE USING CRYOVEIN;  Surgeon: Waynetta Sandy, MD;  Location: [place];  Service: Vascular;  Laterality: Left;  . GROIN DEBRIDEMENT Left 04/27/2019   Procedure: GROIN DEBRIDEMENT;  Surgeon: Waynetta Sandy, MD;  Location: [place];  Service: Vascular;  Laterality: Left;  . INSERTION OF ILIAC STENT  03/30/2019   Procedure: Stent of left common, external iliac veins and left common femoral vein;  Surgeon: Waynetta Sandy, MD;  Location: [place];  Service: Vascular;;  . LOWER EXTREMITY VENOGRAPHY N/A 08/17/2018   Procedure: LOWER EXTREMITY VENOGRAPHY - Central Venogram;  Surgeon: Waynetta Sandy, MD;  Location: [place] [facility];  Service: Cardiovascular;  Laterality: N/A;  . LOWER EXTREMITY VENOGRAPHY Bilateral 03/09/2019   Procedure: LOWER EXTREMITY VENOGRAPHY;  Surgeon: Waynetta Sandy, MD;  Location: [place] [facility];  Service: Cardiovascular;  Laterality: Bilateral;  . LOWER EXTREMITY VENOGRAPHY Left 08/16/2019    Procedure: LOWER EXTREMITY VENOGRAPHY;  Surgeon: Waynetta Sandy, MD;  Location: [place] [facility];  Service: Cardiovascular;  Laterality: Left;  . LUMBAR FUSION  11/21/2000   L5-S1  . LUMBAR SPINE SURGERY     x 2 others  . PATCH ANGIOPLASTY Left 03/30/2019   Procedure: Patch Angioplasty of the Left Common Femoral Vein using Venosure Biologic patch;  Surgeon: Waynetta Sandy, MD;  Location: [place];  Service: Vascular;  Laterality: Left;  . PERIPHERAL VASCULAR INTERVENTION Left 08/16/2019   Procedure: PERIPHERAL VASCULAR INTERVENTION;  Surgeon: Waynetta Sandy, MD;  Location: [place] [facility];  Service: Cardiovascular;  Laterality: Left;  common femoral/femoral vein stent  . TRIGGER FINGER RELEASE Right 12/01/2017   Procedure: RELEASE TRIGGER FINGER/A-1 PULLEY RIGHT THUMB;  Surgeon: Leanora Cover, MD;  Location: [place];  Service: Orthopedics;  Laterality: Right;  . TRIGGER FINGER RELEASE Left 01/26/2018   Procedure: LEFT TRIGGER THUMB RELEASE;  Surgeon: Leanora Cover, MD;  Location: [place];  Service: Orthopedics;  Laterality: Left;  . ULTRASOUND GUIDANCE FOR VASCULAR ACCESS Right 03/30/2019   Procedure: Ultrasound-guided cannulation right internal jugular vein;  Surgeon: Waynetta Sandy, MD;  Location: [place];  Service: Vascular;  Laterality: Right;   Social History   Tobacco Use  . Smoking status: Never Smoker  . Smokeless tobacco: Never Used  Vaping Use  . Vaping Use: Never used  Substance Use Topics  . Alcohol use: No  . Drug use: No     Ht 5\' 8"  (1.727 m)   Wt 263 lb (119.3 kg)   LMP  (LMP Unknown)   BMI 39.99 kg/m   Medical decision-making section   DATA  MRI REPORT: IMPRESSION: Horizontal tear posterior horn and body of the medial meniscus. The body of the medial meniscus is degenerated and diminutive.  Fraying along the free edge of the body of the lateral meniscus.  Mild-to-moderate  osteoarthritis is worst in the medial compartment.   MY READING: MRI OF THE degenerative arthritis torn medial meniscus   Encounter Diagnoses  Name Primary?  . Body mass index 40.0-44.9, adult ([place]) Yes  . Morbid obesity ([place])   . Other meniscus derangements, posterior horn of medial meniscus, left knee       PLAN:    Ms. Wee is high risk for surgery she will have to come off of her blood thinners which is Coumadin she is currently on warfarin her INR on the 17th was 3.4 she has had recurrent DVT she is followed by Dr. Gwenlyn Saran in [place] and also by Dr. Brita Romp in [place]  She is made aware of the high risk for surgery she is made aware that the arthritis pain will persist we went over the procedure and we used a model  She was concerned about her risk and it  is fairly high for recurrent clot  She could also clotted graft.  Recommend no tourniquet used during the procedure  4 to 6 weeks follow-up  Patient wants to think about it and let us know

## 2020-06-20 NOTE — Patient Instructions (Signed)
Description   Hold x 1 day then resume 5mg  a day.  Recheck in 2 weeks.

## 2020-06-21 ENCOUNTER — Ambulatory Visit (INDEPENDENT_AMBULATORY_CARE_PROVIDER_SITE_OTHER): Payer: Medicaid Other | Admitting: Orthopedic Surgery

## 2020-06-21 ENCOUNTER — Encounter: Payer: Self-pay | Admitting: Orthopedic Surgery

## 2020-06-21 ENCOUNTER — Other Ambulatory Visit: Payer: Self-pay

## 2020-06-21 VITALS — Ht 68.0 in | Wt 263.0 lb

## 2020-06-21 DIAGNOSIS — M722 Plantar fascial fibromatosis: Secondary | ICD-10-CM | POA: Diagnosis not present

## 2020-06-21 DIAGNOSIS — G609 Hereditary and idiopathic neuropathy, unspecified: Secondary | ICD-10-CM | POA: Diagnosis not present

## 2020-06-21 DIAGNOSIS — M23322 Other meniscus derangements, posterior horn of medial meniscus, left knee: Secondary | ICD-10-CM

## 2020-06-21 DIAGNOSIS — M7752 Other enthesopathy of left foot: Secondary | ICD-10-CM | POA: Diagnosis not present

## 2020-06-21 DIAGNOSIS — Z6841 Body Mass Index (BMI) 40.0 and over, adult: Secondary | ICD-10-CM

## 2020-06-21 DIAGNOSIS — E039 Hypothyroidism, unspecified: Secondary | ICD-10-CM | POA: Diagnosis not present

## 2020-06-21 NOTE — Patient Instructions (Signed)
Call back to schedule surgery   Meniscus Injury, Arthroscopy   Arthroscopy is a surgical procedure that involves the use of a small scope that has a camera and surgical instruments on the end (arthroscope). An arthroscope can be used to repair your meniscus injury.  LET [facility] CARE PROVIDER KNOW ABOUT:  Any allergies you have.  All medicines you are taking, including vitamins, herbs, eyedrops, creams, and over-the-counter medicines.  Any recent colds or infections you have had or currently have.  Previous problems you or members of your family have had with the use of anesthetics.  Any blood disorders or blood clotting problems you have.  Previous surgeries you have had.  Medical conditions you have. RISKS AND COMPLICATIONS Generally, this is a safe procedure. However, as with any procedure, problems can occur. Possible problems include:  Damage to nerves or blood vessels.  Excess bleeding.  Blood clots.  Infection. BEFORE THE PROCEDURE  Do not eat or drink for 6-8 hours before the procedure.  Take medicines as directed by your surgeon. Ask your surgeon about changing or stopping your regular medicines.  You may have lab tests the morning of surgery. PROCEDURE  You will be given one of the following:   A medicine that numbs the area (local anesthesia).  A medicine that makes you go to sleep (general anesthesia).  A medicine injected into your spine that numbs your body below the waist (spinal anesthesia). Most often, several small cuts (incisions) are made in the knee. The arthroscope and instruments go into the incisions to repair the damage. The torn portion of the meniscus is removed.   AFTER THE PROCEDURE  You will be taken to the recovery area where your progress will be monitored. When you are awake, stable, and taking fluids without complications, you will be allowed to go home. This is usually the same day. A torn or stretched ligament (ligament sprain)  may take 6-8 weeks to heal.   It takes about the 4-6 WEEKS if your surgeon removed a torn meniscus.  A repaired meniscus may require 6-12 weeks of recovery time.  A torn ligament needing reconstructive surgery may take 6-12 months to heal fully.   This information is not intended to replace advice given to you by your health care provider. Make sure you discuss any questions you have with your health care provider. You have decided to proceed with operative arthroscopy of the knee. You have decided not to continue with nonoperative measures such as but not limited to oral medication, weight loss, activity modification, physical therapy, bracing, or injection.  We will perform operative arthroscopy of the knee. Some of the risks associated with arthroscopic surgery of the knee include but are not limited to Bleeding Infection Swelling Stiffness Blood clot Pain Need for knee replacement surgery    In compliance with recent [place] law in federal regulation regarding opioid use and abuse and addiction, we will taper (stop) opioid medication after 2 weeks.  If you're not comfortable with these risks and would like to continue with nonoperative treatment please let Dr. Aline Brochure know prior to your surgery.

## 2020-06-22 ENCOUNTER — Ambulatory Visit (INDEPENDENT_AMBULATORY_CARE_PROVIDER_SITE_OTHER): Payer: Medicaid Other | Admitting: Family Medicine

## 2020-06-22 DIAGNOSIS — E039 Hypothyroidism, unspecified: Secondary | ICD-10-CM | POA: Diagnosis not present

## 2020-06-23 ENCOUNTER — Ambulatory Visit: Payer: Medicaid Other | Admitting: Orthopedic Surgery

## 2020-06-23 DIAGNOSIS — E039 Hypothyroidism, unspecified: Secondary | ICD-10-CM | POA: Diagnosis not present

## 2020-06-24 DIAGNOSIS — E039 Hypothyroidism, unspecified: Secondary | ICD-10-CM | POA: Diagnosis not present

## 2020-06-25 DIAGNOSIS — E039 Hypothyroidism, unspecified: Secondary | ICD-10-CM | POA: Diagnosis not present

## 2020-06-26 ENCOUNTER — Encounter (INDEPENDENT_AMBULATORY_CARE_PROVIDER_SITE_OTHER): Payer: Self-pay | Admitting: Family Medicine

## 2020-06-26 ENCOUNTER — Other Ambulatory Visit: Payer: Self-pay

## 2020-06-26 ENCOUNTER — Ambulatory Visit (INDEPENDENT_AMBULATORY_CARE_PROVIDER_SITE_OTHER): Payer: Medicaid Other | Admitting: Family Medicine

## 2020-06-26 VITALS — BP 96/60 | HR 69 | Temp 97.9°F | Ht 68.0 in | Wt 262.0 lb

## 2020-06-26 DIAGNOSIS — I152 Hypertension secondary to endocrine disorders: Secondary | ICD-10-CM

## 2020-06-26 DIAGNOSIS — Z6839 Body mass index (BMI) 39.0-39.9, adult: Secondary | ICD-10-CM | POA: Diagnosis not present

## 2020-06-26 DIAGNOSIS — I10 Essential (primary) hypertension: Secondary | ICD-10-CM | POA: Diagnosis not present

## 2020-06-26 DIAGNOSIS — E039 Hypothyroidism, unspecified: Secondary | ICD-10-CM | POA: Diagnosis not present

## 2020-06-26 DIAGNOSIS — E1159 Type 2 diabetes mellitus with other circulatory complications: Secondary | ICD-10-CM | POA: Diagnosis not present

## 2020-06-26 DIAGNOSIS — E1165 Type 2 diabetes mellitus with hyperglycemia: Secondary | ICD-10-CM | POA: Diagnosis not present

## 2020-06-27 DIAGNOSIS — R32 Unspecified urinary incontinence: Secondary | ICD-10-CM | POA: Diagnosis not present

## 2020-06-27 DIAGNOSIS — E039 Hypothyroidism, unspecified: Secondary | ICD-10-CM | POA: Diagnosis not present

## 2020-06-27 NOTE — Progress Notes (Signed)
Chief Complaint:   OBESITY Jane [name] is here to discuss her progress with her obesity treatment plan along with follow-up of her obesity related diagnoses. Jane [name] is on the Category 4 Plan and states she is following her eating plan approximately 75% of the time. Jane [name] states she is doing 0 minutes 0 times per week.  Today's visit was #: 6 Starting weight: 283 lbs Starting date: 03/29/2020 Today's weight: 262 lbs Today's date: 06/26/2020 Total lbs lost to date: 21 Total lbs lost since last in-office visit: 0  Interim History: Jane [name] had to quarantine recently secondary to exposure to [facility]. She did feel she was making strides in terms of following the plan. She hasn't been able to do much physical activity secondary to torn meniscus. She does notes she may do some comfort eating while in pain.  Subjective:   1. Hypertension associated with diabetes ([place]) Jane [name]'s blood pressure is low today, her blood pressure earlier was 122/78. She denies chest pain, chest pressure, or headache.  2. Type 2 diabetes mellitus with hyperglycemia, without long-term current use of insulin ([place]) Jane [name]'s last [name] was 6.2, and she is not on any medications.  Assessment/Plan:   1. Hypertension associated with diabetes ([place]) Jane [name] is working on healthy weight loss and exercise to improve blood pressure control. We will watch for signs of hypotension as she continues her lifestyle modifications. We will follow up on her blood pressure at next appointment. If her blood pressure is low again then we will stop lisinopril.  2. Type 2 diabetes mellitus with hyperglycemia, without long-term current use of insulin ([facility]) Good blood sugar control is important to decrease the likelihood of diabetic complications such as nephropathy, neuropathy, limb loss, blindness, coronary artery disease, and death. Intensive lifestyle modification including diet, exercise and weight loss are the first line of treatment for diabetes.  We  will follow up on labs at her next appointment, and Jane [name] will follow up as directed.  3. Class 2 severe obesity with serious comorbidity and body mass index (BMI) of 39.0 to 39.9 in adult, unspecified obesity type (HCC) Jane [name] is currently in the action stage of change. As such, her goal is to continue with weight loss efforts. She has agreed to the Category 4 Plan.   Exercise goals: All adults should avoid inactivity. Some physical activity is better than none, and adults who participate in any amount of physical activity gain some health benefits.  Behavioral modification strategies: increasing lean protein intake, increasing vegetables, meal planning and cooking strategies, keeping healthy foods in the home and planning for success.  Jane [name] has agreed to follow-up with our clinic in 2 weeks. She was informed of the importance of frequent follow-up visits to maximize her success with intensive lifestyle modifications for her multiple health conditions.   Objective:   Blood pressure 96/60, pulse 69, temperature 97.9 F (36.6 C), temperature source Oral, height 5\' 8"  (1.727 m), weight 262 lb (118.8 kg), SpO2 96 %. Body mass index is 39.84 kg/m.  General: Cooperative, alert, well developed, in no acute distress. HEENT: Conjunctivae and lids unremarkable. Cardiovascular: Regular rhythm.  Lungs: Normal work of breathing. Neurologic: No focal deficits.   Lab Results  Component Value Date   CREATININE 1.24 (H) 04/13/2020   BUN 44 (H) 04/13/2020   NA 142 04/13/2020   K 4.3 04/13/2020   CL 107 04/13/2020   CO2 23 04/13/2020   Lab Results  Component Value Date   ALT 17 04/13/2020   AST  13 (L) 04/13/2020   ALKPHOS 86 04/13/2020   BILITOT 0.3 04/13/2020   Lab Results  Component Value Date   HGBA1C 6.2 (H) 03/29/2020   HGBA1C 6.3 (H) 10/25/2019   HGBA1C 6.3 (H) 03/26/2019   HGBA1C 7.5 11/18/2018   Lab Results  Component Value Date   INSULIN 21.5 03/29/2020   Lab Results    Component Value Date   TSH 0.107 (L) 05/18/2020   Lab Results  Component Value Date   CHOL 203 (H) 03/29/2020   HDL 37 (L) 03/29/2020   LDLCALC 114 (H) 03/29/2020   TRIG 296 (H) 03/29/2020   CHOLHDL 5.0 (H) 10/25/2019   Lab Results  Component Value Date   WBC 4.8 04/13/2020   HGB 13.1 04/13/2020   HCT 40.8 04/13/2020   MCV 96.5 04/13/2020   PLT 149 (L) 04/13/2020   Lab Results  Component Value Date   IRON 97 04/13/2020   TIBC 353 04/13/2020   FERRITIN 40 04/13/2020   Attestation Statements:   Reviewed by clinician on day of visit: allergies, medications, problem list, medical history, surgical history, family history, social history, and previous encounter notes.  Time spent on visit including pre-visit chart review and post-visit care and charting was 16 minutes.    I, Trixie Dredge, am acting as transcriptionist for Coralie Common, MD.  I have reviewed the above documentation for accuracy and completeness, and I agree with the above. - Jinny Blossom, MD

## 2020-06-28 DIAGNOSIS — E039 Hypothyroidism, unspecified: Secondary | ICD-10-CM | POA: Diagnosis not present

## 2020-06-29 ENCOUNTER — Other Ambulatory Visit: Payer: Self-pay | Admitting: Family Medicine

## 2020-06-29 DIAGNOSIS — E039 Hypothyroidism, unspecified: Secondary | ICD-10-CM | POA: Diagnosis not present

## 2020-06-30 DIAGNOSIS — E039 Hypothyroidism, unspecified: Secondary | ICD-10-CM | POA: Diagnosis not present

## 2020-07-01 DIAGNOSIS — E039 Hypothyroidism, unspecified: Secondary | ICD-10-CM | POA: Diagnosis not present

## 2020-07-02 DIAGNOSIS — E039 Hypothyroidism, unspecified: Secondary | ICD-10-CM | POA: Diagnosis not present

## 2020-07-03 ENCOUNTER — Telehealth: Payer: Self-pay | Admitting: *Deleted

## 2020-07-03 DIAGNOSIS — E039 Hypothyroidism, unspecified: Secondary | ICD-10-CM | POA: Diagnosis not present

## 2020-07-03 NOTE — Telephone Encounter (Signed)
Copied from [place] [id number]. Topic: General - Other >> Jul 03, 2020 10:17 AM Celene Kras wrote: Reason for CRM: Pt called and is requesting to speak with PCP regarding a possible knee surgery due to her blood clots. Please advise.

## 2020-07-04 ENCOUNTER — Ambulatory Visit (INDEPENDENT_AMBULATORY_CARE_PROVIDER_SITE_OTHER): Payer: Medicaid Other

## 2020-07-04 ENCOUNTER — Other Ambulatory Visit: Payer: Self-pay

## 2020-07-04 DIAGNOSIS — Z86718 Personal history of other venous thrombosis and embolism: Secondary | ICD-10-CM | POA: Diagnosis not present

## 2020-07-04 DIAGNOSIS — E039 Hypothyroidism, unspecified: Secondary | ICD-10-CM | POA: Diagnosis not present

## 2020-07-04 LAB — POCT INR
INR: 3.6 — AB (ref 2.0–3.0)
PT: 43.3

## 2020-07-04 NOTE — Telephone Encounter (Signed)
Left message for patient to call office back, okay for [facility] nurse triage to advise patient of message below. KW

## 2020-07-04 NOTE — Patient Instructions (Signed)
Description   5mg  daily and 2mg  on Tuesdays.  Recheck in 2 weeks.

## 2020-07-04 NOTE — Telephone Encounter (Signed)
FYI

## 2020-07-04 NOTE — Telephone Encounter (Signed)
She is always going to carry risk of blood clot during/after surgery.  We would be able to mitigate this by using Lovenox blood thinner until day of surgery and resuming it afterward until INR is at goal.  No surgery is without risk, so I think it is reasonable to weight risks and benefits with her surgeon.

## 2020-07-04 NOTE — Telephone Encounter (Signed)
Spoke with patient on the phone who states that she is suppose to have left knee surgey of a meniscus injury, patient reported history of blood clots and states that she has concerns of having this surgery due the the fact that she is considered high risk. Patient wanted to know from PCP if she would think this surgery would be good to have done now with her being high risk? Or should she wait to have surgery? KW

## 2020-07-04 NOTE — Telephone Encounter (Signed)
Pt. Called back and given Dr. Nancy Nordmann message, verbalizes understanding. States she will speak with her Psychologist, sport and exercise.

## 2020-07-05 DIAGNOSIS — E039 Hypothyroidism, unspecified: Secondary | ICD-10-CM | POA: Diagnosis not present

## 2020-07-06 DIAGNOSIS — E039 Hypothyroidism, unspecified: Secondary | ICD-10-CM | POA: Diagnosis not present

## 2020-07-07 DIAGNOSIS — E039 Hypothyroidism, unspecified: Secondary | ICD-10-CM | POA: Diagnosis not present

## 2020-07-08 DIAGNOSIS — E039 Hypothyroidism, unspecified: Secondary | ICD-10-CM | POA: Diagnosis not present

## 2020-07-09 DIAGNOSIS — E039 Hypothyroidism, unspecified: Secondary | ICD-10-CM | POA: Diagnosis not present

## 2020-07-11 ENCOUNTER — Telehealth: Payer: Self-pay | Admitting: Orthopedic Surgery

## 2020-07-11 ENCOUNTER — Encounter: Payer: Self-pay | Admitting: Student in an Organized Health Care Education/Training Program

## 2020-07-11 ENCOUNTER — Ambulatory Visit
Payer: Medicaid Other | Attending: Student in an Organized Health Care Education/Training Program | Admitting: Student in an Organized Health Care Education/Training Program

## 2020-07-11 DIAGNOSIS — E039 Hypothyroidism, unspecified: Secondary | ICD-10-CM | POA: Diagnosis not present

## 2020-07-11 DIAGNOSIS — M792 Neuralgia and neuritis, unspecified: Secondary | ICD-10-CM | POA: Diagnosis not present

## 2020-07-11 DIAGNOSIS — M961 Postlaminectomy syndrome, not elsewhere classified: Secondary | ICD-10-CM | POA: Diagnosis not present

## 2020-07-11 DIAGNOSIS — E1142 Type 2 diabetes mellitus with diabetic polyneuropathy: Secondary | ICD-10-CM | POA: Insufficient documentation

## 2020-07-11 DIAGNOSIS — G894 Chronic pain syndrome: Secondary | ICD-10-CM | POA: Diagnosis not present

## 2020-07-11 DIAGNOSIS — M5030 Other cervical disc degeneration, unspecified cervical region: Secondary | ICD-10-CM | POA: Diagnosis not present

## 2020-07-11 MED ORDER — ORPHENADRINE CITRATE 30 MG/ML IJ SOLN
INTRAMUSCULAR | Status: AC
  Filled 2020-07-11: qty 2

## 2020-07-11 MED ORDER — METHOCARBAMOL 750 MG PO TABS: 750.0000 mg | ORAL_TABLET | Freq: Two times a day (BID) | ORAL | 2 refills | Status: DC | PRN

## 2020-07-11 MED ORDER — MAGNESIUM 250 MG PO TABS: 500.0000 mg | ORAL_TABLET | Freq: Every day | ORAL | 2 refills | Status: AC

## 2020-07-11 MED ORDER — HYDROCODONE-ACETAMINOPHEN 10-325 MG PO TABS: 1.0000 | ORAL_TABLET | Freq: Four times a day (QID) | ORAL | 0 refills | Status: AC | PRN

## 2020-07-11 MED ORDER — HYDROCODONE-ACETAMINOPHEN 10-325 MG PO TABS: 1.0000 | ORAL_TABLET | Freq: Four times a day (QID) | ORAL | 0 refills | Status: DC | PRN

## 2020-07-11 NOTE — Progress Notes (Signed)
Nursing Pain Medication Assessment:  Safety precautions to be maintained throughout the outpatient stay will include: orient to surroundings, keep bed in low position, maintain call bell within reach at all times, provide assistance with transfer out of bed and ambulation.  Medication Inspection Compliance: Pill count conducted under aseptic conditions, in front of the patient. Neither the pills nor the bottle was removed from the patient's sight at any time. Once count was completed pills were immediately returned to the patient in their original bottle.  Medication: Hydrocodone/APAP Pill/Patch Count: 21 of 120 pills remain Pill/Patch Appearance: Markings consistent with prescribed medication Bottle Appearance: Standard pharmacy container. Clearly labeled. Filled Date: 08 /13/ 2021 Last Medication intake:  Today

## 2020-07-11 NOTE — Telephone Encounter (Signed)
She is on coumadin Has not discussed with her doctor Asked me to send letter to her doctor who manages coumadin Clearance letter sent I told her when it is received I will call her.

## 2020-07-11 NOTE — Telephone Encounter (Signed)
Patient called to speak with Amy regarding scheduling surgery of left knee. Ph# [phone]

## 2020-07-11 NOTE — Progress Notes (Signed)
PROVIDER NOTE: Information contained herein reflects review and annotations entered in association with encounter. Interpretation of such information and data should be left to medically-trained personnel. Information provided to patient can be located elsewhere in the medical record under "Patient Instructions". Document created using STT-dictation technology, any transcriptional errors that may result from process are unintentional.    Patient: Jane [name]  Service Category: E/M  Provider: Gillis Santa, MD  DOB: 1966-10-03  DOS: 07/11/2020  Specialty: Interventional Pain Management  MRN: [id number]  Setting: Ambulatory outpatient  PCP: [name], MD  Type: Established Patient    Referring Provider: [name], MD  Location: Office  Delivery: Face-to-face     HPI  Reason for encounter: Ms. Romi Rathel, a 54 y.o. year old female, is here today for evaluation and management of her No primary diagnosis found.. Ms. Schank's primary complain today is Knee Pain (left) and Back Pain Last encounter: Practice (05/16/2020). My last encounter with her was on 05/16/2020. Pertinent problems: Ms. 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 Pain Assessment: Severity of Chronic pain is reported as a 10-Worst pain ever/10. Location: Knee Left/unsure due to loss of feeling in calf. Onset: More than a month ago. Quality: Aching. Timing: Constant. Modifying factor(s): denies. Vitals:  height is _0  (1.778 m) and weight is 257 lb (116.6 kg). Her temperature is 97.4 F (36.3 C) (abnormal). Her blood pressure is 138/68 and her pulse is 101 (abnormal). Her respiration is 18 and oxygen saturation is 100%.   Presents today with severe left knee pain.  She had a left knee MRI done which shows horizontal tear of the posterior horn of the medial meniscus.  She also has moderate osteoarthritis in the medial compartment.  She saw Dr. Aline Brochure with orthopedics.  She has been told that she needs knee surgery.  She is on Coumadin and plan to bridge to Lovenox needs to be discussed with patient's primary care provider before surgery as scheduled.  She is complaining of severe knee pain and is requesting an increase in her pain medications.  I informed the patient that she is on Xanax and high-dose Lyrica.  She will need to find an alternative to Xanax if we are to consider any dose escalation of her opioid analgesics.  I was very clear with the patient that we will not exceed a dose beyond 60 MME.  I recommend the patient to find an alternative to alprazolam with her psychiatrist.  In the meantime, recommend that she increase her magnesium to 500 mg.  I will also have her discontinue Flexeril and start Robaxin as needed.  We will refill hydrocodone as below.  No change in dose.  Pharmacotherapy Assessment   06/15/2020  1   05/16/2020  Hydrocodone-Acetamin 10-325 MG  120.00  30 Bi Lat   [id number]   [place] (0252)   0/0  40.00 MME  Medicaid   [place]  Analgesic: Hydrocodone 10 mg 3 times daily as needed, quantity 120/month; MME equals 40   Monitoring: [place] PMP: PDMP reviewed during this encounter.       Pharmacotherapy: No side-effects or adverse  reactions reported. Compliance: No problems identified. Effectiveness: Clinically acceptable.  Dewayne Shorter, RN  07/11/2020  2:36 PM  Signed Nursing Pain Medication Assessment:  Safety precautions to be maintained throughout the outpatient stay will include: orient to surroundings, keep bed in low position, maintain call bell within reach at all times, provide assistance with transfer out of bed and ambulation.  Medication Inspection Compliance: Pill count conducted under aseptic conditions, in front of the patient. Neither the pills nor the bottle was removed from the patient's sight at any time. Once count was completed pills were immediately returned to the patient in their original bottle.  Medication: Hydrocodone/APAP Pill/Patch Count: 21 of 120 pills remain Pill/Patch Appearance: Markings consistent with prescribed medication Bottle Appearance: Standard pharmacy container. Clearly labeled. Filled Date: 08 /13/ 2021 Last Medication intake:  Today    UDS:  Summary  Date Value Ref Range Status  04/24/2020 Note  Final    Comment:    ==================================================================== Compliance Drug Analysis, Ur ==================================================================== Test                             Result       Flag       Units  Drug Present and Declared for Prescription Verification   Amphetamine                    13465        EXPECTED   ng/mg creat    Amphetamine is available as a schedule II prescription drug.    Hydrocodone                    335          EXPECTED   ng/mg creat   Dihydrocodeine                 104          EXPECTED   ng/mg creat   Norhydrocodone                 284          EXPECTED   ng/mg creat    Sources of hydrocodone include scheduled prescription medications.    Dihydrocodeine and norhydrocodone are expected metabolites of    hydrocodone. Dihydrocodeine is also available as a scheduled    prescription medication.     Pregabalin                     PRESENT      EXPECTED   Cyclobenzaprine                PRESENT      EXPECTED   Desmethylcyclobenzaprine       PRESENT      EXPECTED    Desmethylcyclobenzaprine is an expected metabolite of    cyclobenzaprine.    Fluoxetine                     PRESENT      EXPECTED   Norfluoxetine                  PRESENT      EXPECTED    Norfluoxetine is an expected metabolite  of fluoxetine.    Acetaminophen                  PRESENT      EXPECTED  Drug Absent but Declared for Prescription Verification   Alprazolam                     Not Detected UNEXPECTED ng/mg creat   Salicylate                     Not Detected UNEXPECTED    Aspirin, as indicated in the declared medication list, is not always    detected even when used as directed.  ==================================================================== Test                      Result    Flag   Units      Ref Range   Creatinine              55               mg/dL      >=20 ==================================================================== Declared Medications:  The flagging and interpretation on this report are based on the  following declared medications.  Unexpected results may arise from  inaccuracies in the declared medications.   **Note: The testing scope of this panel includes these medications:   Alprazolam (Xanax)  Amphetamine (Adderall)  Cyclobenzaprine (Flexeril)  Fluoxetine (Prozac)  Hydrocodone (Norco)  Pregabalin (Lyrica)   **Note: The testing scope of this panel does not include small to  moderate amounts of these reported medications:   Acetaminophen (Norco)  Aspirin   **Note: The testing scope of this panel does not include the  following reported medications:   Atorvastatin (Lipitor)  Cetirizine (Zyrtec)  Eye Drop  Hydrochlorothiazide (Hydrodiuril)  Levothyroxine (Synthroid)  Linaclotide (Linzess)  Lisinopril (Zestril)  Magnesium  Multivitamin  Olopatadine  Pantoprazole  (Protonix)  Potassium (Klor-Con)  Ropinirole (Requip)  Vitamin C  Vitamin D2 (Drisdol)  Warfarin (Coumadin) ==================================================================== For clinical consultation, please call [phone]. ====================================================================      ROS  Constitutional: Denies any fever or chills Gastrointestinal: No reported hemesis, hematochezia, vomiting, or acute GI distress Musculoskeletal: Low back, left knee pain, pain with weightbearing. Neurological: No reported episodes of acute onset apraxia, aphasia, dysarthria, agnosia, amnesia, paralysis, loss of coordination, or loss of consciousness  Medication Review  ALPRAZolam, FLUoxetine, Garlic, HYDROcodone-acetaminophen, Magnesium, Olopatadine HCl, Omega-3 Fatty Acids, amphetamine-dextroamphetamine, aspirin EC, atorvastatin, cetirizine, cycloSPORINE, levothyroxine, linaclotide, lisinopril, methocarbamol, multivitamin, pantoprazole, potassium chloride, pregabalin, rOPINIRole, and warfarin  History Review  Allergy: Ms. Gullickson is allergic to aripiprazole, seroquel [quetiapine fumarate], chlorpromazine, and gabapentin. Drug: Ms. Koloski  reports no history of drug use. Alcohol:  reports no history of alcohol use. Tobacco:  reports that she has never smoked. She has never used smokeless tobacco. Social: Ms. Dodgen  reports that she has never smoked. She has never used smokeless tobacco. She reports that she does not drink alcohol and does not use drugs. Medical:  has a past medical history of ADD (attention deficit disorder), Anxiety, Back pain, Bilateral swelling of feet, Bipolar 1 disorder ([place]), Bipolar 1 disorder ([place]), Bipolar disorder ([place]), Chewing difficulty, Chronic fatigue syndrome, Chronic kidney disease, Constipation, Depression, Diabetes mellitus without complication ([place]), Dyspnea, GERD (gastroesophageal reflux disease), Headache, High cholesterol, History of  blood clots, History of DVT (deep vein thrombosis), Hypertension, Hypothyroidism, Joint pain, Neuropathy, Obsessive-compulsive disorder, Peripheral vascular disease ([place]), Prediabetes, Respiratory failure requiring intubation ([place]), Restless leg  syndrome, Shortness of breath, Sleep apnea, Swallowing difficulty, Thrombocytopenia (HCC) (09/15/2019), Trigger thumb of left hand (01/2018), and Trigger thumb of right hand. Surgical: Ms. Bridwell  has a past surgical history that includes Cholecystectomy; Trigger finger release (Right, 12/01/2017); Abdominal hysterectomy (06/2016); Lumbar fusion (11/21/2000); Lumbar spine surgery; Trigger finger release (Left, 01/26/2018); LOWER EXTREMITY VENOGRAPHY (N/A, 08/17/2018); Femoral-femoral Bypass Graft (Left, 11/24/2018); AV fistula placement (Left, 11/24/2018); LOWER EXTREMITY VENOGRAPHY (Bilateral, 03/09/2019); Patch angioplasty (Left, 03/30/2019); Ultrasound guidance for vascular access (Right, 03/30/2019); Femoral artery debridement (03/30/2019); Insertion of iliac stent (03/30/2019); Groin debridement (Left, 04/27/2019); Application if wound vac (Left, 04/27/2019); LOWER EXTREMITY VENOGRAPHY (Left, 08/16/2019); PERIPHERAL VASCULAR INTERVENTION (Left, 08/16/2019); Back surgery; and Colonoscopy with propofol (N/A, 11/22/2019). Family: family history includes ADD / ADHD in her daughter; Anxiety disorder in her daughter; Bipolar disorder in her daughter, maternal aunt, and mother; Depression in her mother; Diabetes in her father; Drug abuse in her daughter and daughter; Heart disease in her brother, father, and mother; Hyperlipidemia in her brother, father, and mother; Hypertension in her brother, father, mother, and sister; Obesity in her father and mother; Sleep apnea in her father and mother; Stroke in her mother; Suicidality in her maternal aunt.  Laboratory Chemistry Profile   Renal Lab Results  Component Value Date   BUN 44 (H) 04/13/2020   CREATININE 1.24 (H) 04/13/2020    BCR 18 03/29/2020   GFRAA 57 (L) 04/13/2020   GFRNONAA 50 (L) 04/13/2020     Hepatic Lab Results  Component Value Date   AST 13 (L) 04/13/2020   ALT 17 04/13/2020   ALBUMIN 4.3 04/13/2020   ALKPHOS 86 04/13/2020     Electrolytes Lab Results  Component Value Date   NA 142 04/13/2020   K 4.3 04/13/2020   CL 107 04/13/2020   CALCIUM 9.7 04/13/2020   MG 2.2 04/04/2019   PHOS 4.0 04/04/2019     Bone Lab Results  Component Value Date   VD25OH 38.0 03/29/2020     Inflammation (CRP: Acute Phase) (ESR: Chronic Phase) Lab Results  Component Value Date   CRP 11 (H) 02/16/2020   ESRSEDRATE 33 02/16/2020   LATICACIDVEN 1.2 01/05/2020       Note: Above Lab results reviewed.  Recent Imaging Review  MR Knee Left  Wo Contrast CLINICAL DATA:  Left knee pain for 1 year.  No known injury.  EXAM: MRI OF THE LEFT KNEE WITHOUT CONTRAST  TECHNIQUE: Multiplanar, multisequence MR imaging of the knee was performed. No intravenous contrast was administered.  COMPARISON:  Plain films left knee 09/29/2018.  FINDINGS: MENISCI  Medial meniscus: Horizontal tear in the posterior horn reaching the meniscal undersurface extends into the body. The body is degenerated and diminutive.  Lateral meniscus: Mild fraying along the free edge of the body is seen.  LIGAMENTS  Cruciates:  Intact  Collaterals:  Intact.  CARTILAGE  Patellofemoral: There is lateral tilt of the patella and mild cartilage degeneration about the lateral patellar facet and femoral trochlea.  Medial:  Thinned and irregular.  Lateral:  Mildly degenerated.  Joint:  Small joint effusion.  Popliteal Fossa:  No Baker's cyst.  Extensor Mechanism:  Intact.  Bones: No fracture, stress change or worrisome lesion. Small osteophytes are seen about the knee.  Other: None.  IMPRESSION: Horizontal tear posterior horn and body of the medial meniscus. The body of the medial meniscus is degenerated and  diminutive.  Fraying along the free edge of the body of the lateral meniscus.  Mild-to-moderate osteoarthritis is  worst in the medial compartment.  Electronically Signed   By: Inge Rise M.D.   On: 06/16/2020 08:19 Note: Reviewed        Physical Exam  General appearance: Well nourished, well developed, and well hydrated. In no apparent acute distress Mental status: Alert, oriented x 3 (person, place, & time)       Respiratory: No evidence of acute respiratory distress Eyes: PERLA Vitals: BP 138/68   Pulse (!) 101   Temp (!) 97.4 F (36.3 C)   Resp 18   Ht $R'5\' 10"'RS$  (1.778 m)   Wt 257 lb (116.6 kg)   LMP  (LMP Unknown)   SpO2 100%   BMI 36.88 kg/m  BMI: Estimated body mass index is 36.88 kg/m as calculated from the following:   Height as of this encounter: $RemoveBeforeD'5\' 10"'UGItXEioMIUOSS$  (1.778 m).   Weight as of this encounter: 257 lb (116.6 kg). Ideal: Ideal body weight: 68.5 kg (151 lb 0.2 oz) Adjusted ideal body weight: 87.7 kg (193 lb 6.5 oz)   Lumbar Spine Area Exam  Skin & Axial Inspection: Well healed scar from previous spine surgery detected Alignment: Asymmetric Functional ROM: Decreased ROM       Stability: No instability detected Muscle Tone/Strength: Functionally intact. No obvious neuro-muscular anomalies detected. Sensory (Neurological): Dermatomal pain pattern  Provocative Tests: Hyperextension/rotation test: (+) bilaterally for facet joint pain. Lumbar quadrant test (Kemp's test): (+) bilaterally for facet joint pain.   Gait & Posture Assessment  Ambulation: Limited Gait: Antalgic gait (limping) Posture: Difficulty standing up straight, due to pain  Lower Extremity Exam    Side: Right lower extremity  Side: Left lower extremity  Stability: No instability observed          Stability: No instability observed          Skin & Extremity Inspection: Skin color, temperature, and hair growth are WNL. No peripheral edema or cyanosis. No masses, redness, swelling,  asymmetry, or associated skin lesions. No contractures.  Skin & Extremity Inspection: Skin color, temperature, and hair growth are WNL. No peripheral edema or cyanosis. No masses, redness, swelling, asymmetry, or associated skin lesions. No contractures.  Functional ROM: Decreased ROM for hip and knee joints          Functional ROM: Decreased ROM for hip and knee joints          Muscle Tone/Strength: Functionally intact. No obvious neuro-muscular anomalies detected.  Muscle Tone/Strength: Functionally intact. No obvious neuro-muscular anomalies detected.  Sensory (Neurological): Unimpaired        Sensory (Neurological):  Arthropathic arthralgia for left knee severe pain with weightbearing        DTR: Patellar: deferred today Achilles: deferred today Plantar: deferred today  DTR: Patellar: deferred today Achilles: deferred today Plantar: deferred today  Palpation: No palpable anomalies  Palpation: No palpable anomalies     Assessment   Status Diagnosis  Controlled Persistent Persistent 1. Chronic pain syndrome   2. DDD (degenerative disc disease), cervical   3. Failed back surgical syndrome   4. Diabetic peripheral neuropathy ([place])   5. Neurogenic pain      Plan of Care   Ms. Bryannah Boston has a current medication list which includes the following long-term medication(s): amphetamine-dextroamphetamine, amphetamine-dextroamphetamine, amphetamine-dextroamphetamine, atorvastatin, cetirizine, fluoxetine, levothyroxine, linaclotide, lisinopril, pantoprazole, potassium chloride, ropinirole, and warfarin.  Pharmacotherapy (Medications Ordered): Meds ordered this encounter  Medications  . HYDROcodone-acetaminophen (NORCO) 10-325 MG tablet    Sig: Take 1 tablet by mouth every 6 (six) hours as  needed for severe pain. Must last 30 days.    Dispense:  120 tablet    Refill:  0    Chronic Pain. (STOP Act - Not applicable). Fill one day early if closed on scheduled refill date.  Marland Kitchen  HYDROcodone-acetaminophen (NORCO) 10-325 MG tablet    Sig: Take 1 tablet by mouth every 6 (six) hours as needed for severe pain. Must last 30 days.    Dispense:  120 tablet    Refill:  0    Chronic Pain. (STOP Act - Not applicable). Fill one day early if closed on scheduled refill date.  Marland Kitchen HYDROcodone-acetaminophen (NORCO) 10-325 MG tablet    Sig: Take 1 tablet by mouth every 6 (six) hours as needed for severe pain. Must last 30 days.    Dispense:  120 tablet    Refill:  0    Chronic Pain. (STOP Act - Not applicable). Fill one day early if closed on scheduled refill date.  . methocarbamol (ROBAXIN) 750 MG tablet    Sig: Take 1 tablet (750 mg total) by mouth 2 (two) times daily as needed for muscle spasms.    Dispense:  60 tablet    Refill:  2    Do not place this medication, or any other prescription from our practice, on "Automatic Refill". Patient may have prescription filled one day early if pharmacy is closed on scheduled refill date.  . Magnesium 250 MG TABS    Sig: Take 2 tablets (500 mg total) by mouth daily for 90 doses.    Dispense:  60 tablet    Refill:  2    For chronic pain syndrome   Patient was also provided with surgeons letter regarding instructions for postoperative opioid prescribing.  Surgical team will be responsible for the patient's postoperative pain management.  Patient is instructed to continue her hydrocodone as prescribed even through the postoperative course and to take what ever the surgical team prescribes on top of her hydrocodone.  Patient endorsed understanding.  Also recommend that patient find an alternative to Xanax.  If she is able to do so could consider opioid rotation or even slight dose escalation but will not be exceeding 60 MME which I was very clear about.  Follow-up plan:   Return in about 3 months (around 10/10/2020) for Medication Management, in person.   Recent Visits Date Type Provider Dept  05/16/20 Office Visit Gillis Santa, MD [facility]  04/24/20 Office Visit Milinda Pointer, MD [facility]  Showing recent visits within past 90 days and meeting all other requirements Today's Visits Date Type Provider Dept  07/11/20 Office Visit Gillis Santa, MD [facility]  Showing today's visits and meeting all other requirements Future Appointments No visits were found meeting these conditions. Showing future appointments within next 90 days and meeting all other requirements  I discussed the assessment and treatment plan with the patient. The patient was provided an opportunity to ask questions and all were answered. The patient agreed with the plan and demonstrated an understanding of the instructions.  Patient advised to call back or seek an in-person evaluation if the symptoms or condition worsens.  Duration of encounter: 30 minutes.  Note by: Gillis Santa, MD Date: 07/11/2020; Time: 3:16 PM

## 2020-07-12 DIAGNOSIS — E039 Hypothyroidism, unspecified: Secondary | ICD-10-CM | POA: Diagnosis not present

## 2020-07-13 ENCOUNTER — Ambulatory Visit (INDEPENDENT_AMBULATORY_CARE_PROVIDER_SITE_OTHER): Payer: Medicaid Other | Admitting: Family Medicine

## 2020-07-13 ENCOUNTER — Encounter (INDEPENDENT_AMBULATORY_CARE_PROVIDER_SITE_OTHER): Payer: Self-pay | Admitting: Family Medicine

## 2020-07-13 ENCOUNTER — Other Ambulatory Visit: Payer: Self-pay

## 2020-07-13 VITALS — BP 110/68 | HR 62 | Temp 97.7°F | Ht 70.0 in | Wt 258.0 lb

## 2020-07-13 DIAGNOSIS — Z6839 Body mass index (BMI) 39.0-39.9, adult: Secondary | ICD-10-CM | POA: Diagnosis not present

## 2020-07-13 DIAGNOSIS — E039 Hypothyroidism, unspecified: Secondary | ICD-10-CM | POA: Diagnosis not present

## 2020-07-13 DIAGNOSIS — E1169 Type 2 diabetes mellitus with other specified complication: Secondary | ICD-10-CM | POA: Diagnosis not present

## 2020-07-13 DIAGNOSIS — E785 Hyperlipidemia, unspecified: Secondary | ICD-10-CM | POA: Diagnosis not present

## 2020-07-13 DIAGNOSIS — E559 Vitamin D deficiency, unspecified: Secondary | ICD-10-CM

## 2020-07-13 NOTE — Progress Notes (Signed)
Chief Complaint:   OBESITY Jane [name] is here to discuss her progress with her obesity treatment plan along with follow-up of her obesity related diagnoses. Jane [name] is on the Category 4 Plan and states she is following her eating plan approximately 75% of the time. Jane [name] states she is doing 0 minutes 0 times per week.  Today's visit was #: 7 Starting weight: 283 lbs Starting date: 03/29/2020 Today's weight: 258 lbs Today's date: 07/13/2020 Total lbs lost to date: 25 Total lbs lost since last in-office visit: 4  Interim History: Jane [name] is eating on the plan about 75% of time otherwise she is staying home. The biggest challenge to staying on the plan is finding food that calorically and protein wise she meets her goals. She is planning on knee surgery at some point coming up.  Subjective:   1. Vitamin D deficiency Jane [name]'s last Vit D level was 38. She denies nausea, vomiting, or muscle weakness, but she notes fatigue. She is not on Vit D.  2. Hyperlipidemia associated with type 2 diabetes mellitus ([place]) Jane [name]'s last LDL was 114, HDL 37, and triglycerides 296. She is on Lipitor and she denies myalgias. Last LFTs were within normal limits.  Assessment/Plan:   1. Vitamin D deficiency Low Vitamin D level contributes to fatigue and are associated with obesity, breast, and colon cancer. Jane [name] was encouraged to take OTC Vit D, and will get repeated labs at her next appointment. She will follow-up for routine testing of Vitamin D, at least 2-3 times per year to avoid over-replacement.  2. Hyperlipidemia associated with type 2 diabetes mellitus ([place]) Cardiovascular risk and specific lipid/LDL goals reviewed. We discussed several lifestyle modifications today and Jane [name] will continue to work on diet, exercise and weight loss efforts. We will repeat labs at her next appointment. Orders and follow up as documented in patient record.   Counseling Intensive lifestyle modifications are the first line  treatment for this issue. . Dietary changes: Increase soluble fiber. Decrease simple carbohydrates. . Exercise changes: Moderate to vigorous-intensity aerobic activity 150 minutes per week if tolerated. . Lipid-lowering medications: see documented in medical record.  3. Class 2 severe obesity with serious comorbidity and body mass index (BMI) of 39.0 to 39.9 in adult, unspecified obesity type (HCC) Jane [name] is currently in the action stage of change. As such, her goal is to continue with weight loss efforts. She has agreed to the Category 4 Plan.   Exercise goals: No exercise has been prescribed at this time.  Behavioral modification strategies: increasing lean protein intake, increasing vegetables, meal planning and cooking strategies and keeping healthy foods in the home.  Jane [name] has agreed to follow-up with our clinic in 2 weeks. She was informed of the importance of frequent follow-up visits to maximize her success with intensive lifestyle modifications for her multiple health conditions.   Objective:   Blood pressure 110/68, pulse 62, temperature 97.7 F (36.5 C), temperature source Oral, height 5\' 10"  (1.778 m), weight 258 lb (117 kg), SpO2 98 %. Body mass index is 37.02 kg/m.  General: Cooperative, alert, well developed, in no acute distress. HEENT: Conjunctivae and lids unremarkable. Cardiovascular: Regular rhythm.  Lungs: Normal work of breathing. Neurologic: No focal deficits.   Lab Results  Component Value Date   CREATININE 1.24 (H) 04/13/2020   BUN 44 (H) 04/13/2020   NA 142 04/13/2020   K 4.3 04/13/2020   CL 107 04/13/2020   CO2 23 04/13/2020   Lab Results  Component Value Date  ALT 17 04/13/2020   AST 13 (L) 04/13/2020   ALKPHOS 86 04/13/2020   BILITOT 0.3 04/13/2020   Lab Results  Component Value Date   HGBA1C 6.2 (H) 03/29/2020   HGBA1C 6.3 (H) 10/25/2019   HGBA1C 6.3 (H) 03/26/2019   HGBA1C 7.5 11/18/2018   Lab Results  Component Value Date   INSULIN  21.5 03/29/2020   Lab Results  Component Value Date   TSH 0.107 (L) 05/18/2020   Lab Results  Component Value Date   CHOL 203 (H) 03/29/2020   HDL 37 (L) 03/29/2020   LDLCALC 114 (H) 03/29/2020   TRIG 296 (H) 03/29/2020   CHOLHDL 5.0 (H) 10/25/2019   Lab Results  Component Value Date   WBC 4.8 04/13/2020   HGB 13.1 04/13/2020   HCT 40.8 04/13/2020   MCV 96.5 04/13/2020   PLT 149 (L) 04/13/2020   Lab Results  Component Value Date   IRON 97 04/13/2020   TIBC 353 04/13/2020   FERRITIN 40 04/13/2020   Attestation Statements:   Reviewed by clinician on day of visit: allergies, medications, problem list, medical history, surgical history, family history, social history, and previous encounter notes.  Time spent on visit including pre-visit chart review and post-visit care and charting was 15 minutes.    I, Jane [name], am acting as transcriptionist for Jane Common, MD.  I have reviewed the above documentation for accuracy and completeness, and I agree with the above. - Jane Blossom, MD

## 2020-07-14 DIAGNOSIS — E039 Hypothyroidism, unspecified: Secondary | ICD-10-CM | POA: Diagnosis not present

## 2020-07-15 DIAGNOSIS — E039 Hypothyroidism, unspecified: Secondary | ICD-10-CM | POA: Diagnosis not present

## 2020-07-16 DIAGNOSIS — E039 Hypothyroidism, unspecified: Secondary | ICD-10-CM | POA: Diagnosis not present

## 2020-07-17 DIAGNOSIS — E039 Hypothyroidism, unspecified: Secondary | ICD-10-CM | POA: Diagnosis not present

## 2020-07-18 ENCOUNTER — Inpatient Hospital Stay (HOSPITAL_COMMUNITY): Payer: Medicaid Other | Attending: Hematology

## 2020-07-18 ENCOUNTER — Other Ambulatory Visit: Payer: Self-pay

## 2020-07-18 DIAGNOSIS — G4730 Sleep apnea, unspecified: Secondary | ICD-10-CM | POA: Insufficient documentation

## 2020-07-18 DIAGNOSIS — D649 Anemia, unspecified: Secondary | ICD-10-CM

## 2020-07-18 DIAGNOSIS — N183 Chronic kidney disease, stage 3 unspecified: Secondary | ICD-10-CM | POA: Insufficient documentation

## 2020-07-18 DIAGNOSIS — E785 Hyperlipidemia, unspecified: Secondary | ICD-10-CM | POA: Insufficient documentation

## 2020-07-18 DIAGNOSIS — Z8349 Family history of other endocrine, nutritional and metabolic diseases: Secondary | ICD-10-CM | POA: Insufficient documentation

## 2020-07-18 DIAGNOSIS — I10 Essential (primary) hypertension: Secondary | ICD-10-CM | POA: Insufficient documentation

## 2020-07-18 DIAGNOSIS — Z833 Family history of diabetes mellitus: Secondary | ICD-10-CM | POA: Diagnosis not present

## 2020-07-18 DIAGNOSIS — F419 Anxiety disorder, unspecified: Secondary | ICD-10-CM | POA: Insufficient documentation

## 2020-07-18 DIAGNOSIS — E039 Hypothyroidism, unspecified: Secondary | ICD-10-CM | POA: Diagnosis not present

## 2020-07-18 DIAGNOSIS — Z86718 Personal history of other venous thrombosis and embolism: Secondary | ICD-10-CM | POA: Diagnosis not present

## 2020-07-18 DIAGNOSIS — Z7901 Long term (current) use of anticoagulants: Secondary | ICD-10-CM | POA: Diagnosis not present

## 2020-07-18 DIAGNOSIS — E1142 Type 2 diabetes mellitus with diabetic polyneuropathy: Secondary | ICD-10-CM | POA: Diagnosis not present

## 2020-07-18 DIAGNOSIS — F319 Bipolar disorder, unspecified: Secondary | ICD-10-CM | POA: Insufficient documentation

## 2020-07-18 DIAGNOSIS — F429 Obsessive-compulsive disorder, unspecified: Secondary | ICD-10-CM | POA: Insufficient documentation

## 2020-07-18 DIAGNOSIS — G2581 Restless legs syndrome: Secondary | ICD-10-CM | POA: Insufficient documentation

## 2020-07-18 DIAGNOSIS — D509 Iron deficiency anemia, unspecified: Secondary | ICD-10-CM | POA: Insufficient documentation

## 2020-07-18 DIAGNOSIS — Z79899 Other long term (current) drug therapy: Secondary | ICD-10-CM | POA: Insufficient documentation

## 2020-07-18 DIAGNOSIS — Z8249 Family history of ischemic heart disease and other diseases of the circulatory system: Secondary | ICD-10-CM | POA: Insufficient documentation

## 2020-07-18 DIAGNOSIS — E7800 Pure hypercholesterolemia, unspecified: Secondary | ICD-10-CM | POA: Diagnosis not present

## 2020-07-18 LAB — COMPREHENSIVE METABOLIC PANEL
ALT: 19 U/L (ref 0–44)
AST: 18 U/L (ref 15–41)
Albumin: 3.9 g/dL (ref 3.5–5.0)
Alkaline Phosphatase: 102 U/L (ref 38–126)
Anion gap: 11 (ref 5–15)
BUN: 35 mg/dL — ABNORMAL HIGH (ref 6–20)
CO2: 25 mmol/L (ref 22–32)
Calcium: 9.6 mg/dL (ref 8.9–10.3)
Chloride: 104 mmol/L (ref 98–111)
Creatinine, Ser: 1.32 mg/dL — ABNORMAL HIGH (ref 0.44–1.00)
GFR calc Af Amer: 53 mL/min — ABNORMAL LOW (ref >60)
GFR calc non Af Amer: 46 mL/min — ABNORMAL LOW (ref >60)
Glucose, Bld: 109 mg/dL — ABNORMAL HIGH (ref 70–99)
Potassium: 4.6 mmol/L (ref 3.5–5.1)
Sodium: 140 mmol/L (ref 135–145)
Total Bilirubin: 0.9 mg/dL (ref 0.3–1.2)
Total Protein: 7.4 g/dL (ref 6.5–8.1)

## 2020-07-18 LAB — CBC WITH DIFFERENTIAL/PLATELET
Abs Immature Granulocytes: 0.03 10*3/uL (ref 0.00–0.07)
Basophils Absolute: 0 10*3/uL (ref 0.0–0.1)
Basophils Relative: 0 %
Eosinophils Absolute: 0.1 10*3/uL (ref 0.0–0.5)
Eosinophils Relative: 1 %
HCT: 40.5 % (ref 36.0–46.0)
Hemoglobin: 13.2 g/dL (ref 12.0–15.0)
Immature Granulocytes: 0 %
Lymphocytes Relative: 22 %
Lymphs Abs: 1.6 10*3/uL (ref 0.7–4.0)
MCH: 30.3 pg (ref 26.0–34.0)
MCHC: 32.6 g/dL (ref 30.0–36.0)
MCV: 93.1 fL (ref 80.0–100.0)
Monocytes Absolute: 0.6 10*3/uL (ref 0.1–1.0)
Monocytes Relative: 8 %
Neutro Abs: 5 10*3/uL (ref 1.7–7.7)
Neutrophils Relative %: 69 %
Platelets: 135 10*3/uL — ABNORMAL LOW (ref 150–400)
RBC: 4.35 MIL/uL (ref 3.87–5.11)
RDW: 13.5 % (ref 11.5–15.5)
WBC: 7.3 10*3/uL (ref 4.0–10.5)
nRBC: 0 % (ref 0.0–0.2)

## 2020-07-18 LAB — IRON AND TIBC
Iron: 81 ug/dL (ref 28–170)
Saturation Ratios: 29 % (ref 10.4–31.8)
TIBC: 279 ug/dL (ref 250–450)
UIBC: 198 ug/dL

## 2020-07-18 LAB — FERRITIN: Ferritin: 266 ng/mL (ref 11–307)

## 2020-07-18 LAB — VITAMIN D 25 HYDROXY (VIT D DEFICIENCY, FRACTURES): Vit D, 25-Hydroxy: 37.2 ng/mL (ref 30–100)

## 2020-07-18 LAB — FOLATE: Folate: 23.3 ng/mL (ref >5.9)

## 2020-07-18 LAB — VITAMIN B12: Vitamin B-12: 436 pg/mL (ref 180–914)

## 2020-07-18 LAB — LACTATE DEHYDROGENASE: LDH: 195 U/L — ABNORMAL HIGH (ref 98–192)

## 2020-07-19 ENCOUNTER — Ambulatory Visit (INDEPENDENT_AMBULATORY_CARE_PROVIDER_SITE_OTHER): Payer: Medicaid Other

## 2020-07-19 ENCOUNTER — Other Ambulatory Visit (HOSPITAL_COMMUNITY): Payer: Medicaid Other

## 2020-07-19 DIAGNOSIS — Z86718 Personal history of other venous thrombosis and embolism: Secondary | ICD-10-CM | POA: Diagnosis not present

## 2020-07-19 DIAGNOSIS — E039 Hypothyroidism, unspecified: Secondary | ICD-10-CM | POA: Diagnosis not present

## 2020-07-19 LAB — POCT INR
INR: 3.1 — AB (ref 2.0–3.0)
PT: 37.1

## 2020-07-19 NOTE — Telephone Encounter (Signed)
go

## 2020-07-19 NOTE — Telephone Encounter (Signed)
Patient called for update on scheduling knee surgery I have advised her I have not heard anything from her doctor regarding her Coumadin.   She will contact her doctor "Dr B." and check on this  She also states she always has to stay overnight for surgery, I advised her we do not keep overnight for knee scope, she states she "always" has to stay  I advised her we can not book any surgeries requiring overnight stay. She has voiced understanding.   To you FYI

## 2020-07-19 NOTE — Patient Instructions (Signed)
Change to 5mg  daily except 2.5mg  Mondays and Fridays.  Recheck in two weeks.

## 2020-07-20 ENCOUNTER — Inpatient Hospital Stay (HOSPITAL_BASED_OUTPATIENT_CLINIC_OR_DEPARTMENT_OTHER): Payer: Medicaid Other | Admitting: Nurse Practitioner

## 2020-07-20 DIAGNOSIS — E039 Hypothyroidism, unspecified: Secondary | ICD-10-CM | POA: Diagnosis not present

## 2020-07-20 DIAGNOSIS — D649 Anemia, unspecified: Secondary | ICD-10-CM | POA: Diagnosis not present

## 2020-07-20 NOTE — Progress Notes (Signed)
[facility] up:    Jane Crews, MD [address] [place] [address]   DIAGNOSIS: Iron deficiency anemia  CURRENT THERAPY: Intermittent iron infusions  INTERVAL HISTORY: Jane [name] 54 y.o. female was called for telephone visit for iron deficiency anemia.  Patient reports she still has some fatigue.  She reports her fatigue improved slightly with the iron infusions.  She denies any bright red bleeding per rectum or melena.  She denies any easy bruising or bleeding. Denies any nausea, vomiting, or diarrhea. Denies any new pains. Had not noticed any recent bleeding such as epistaxis, hematuria or hematochezia. Denies recent chest pain on exertion, shortness of breath on minimal exertion, pre-syncopal episodes, or palpitations. Denies any numbness or tingling in hands or feet. Denies any recent fevers, infections, or recent hospitalizations. Patient reports appetite at 100% and energy level at 50%.  She is eating well maintain her weight at this time.    Patient Active Problem List   Diagnosis Date Noted  . Chronic pain syndrome 04/24/2020  . Pharmacologic therapy 04/24/2020  . Disorder of skeletal system 04/24/2020  . Problems influencing health status 04/24/2020  . History of thrombocytopenia 04/24/2020  . Abnormal MRI, cervical spine (01/08/2017) 04/24/2020  . Abnormal MRI, lumbar spine (05/11/2014) 04/24/2020  . DDD (degenerative disc disease), cervical 04/24/2020  . DDD (degenerative disc disease), lumbar 04/24/2020  . 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 Diabetic peripheral neuropathy ([place]) 04/24/2020  . Neurogenic pain 04/24/2020  . Chronic musculoskeletal pain 04/24/2020  . Other proteinuria 03/30/2020  . Myalgia 03/15/2020  . Bilateral hand swelling 02/16/2020  . Thrush 01/20/2020  . Morbid obesity ([place]) 10/25/2019  . History of back surgery 10/25/2019  . Peripheral neuropathy 10/25/2019  . Restless leg  syndrome 10/25/2019  . Chronic low back pain (Bilateral) w/ sciatica (Bilateral) 10/25/2019  . Essential hypertension 10/25/2019  . Type 2 diabetes mellitus with stage 3 chronic kidney disease, without long-term current use of insulin ([place]) 10/25/2019  . Chronic anticoagulation (Coumadin) 10/25/2019  . Hyperlipidemia associated with type 2 diabetes mellitus ([place]) 10/25/2019  . CKD stage 3 due to type 2 diabetes mellitus ([place]) 10/25/2019  . GERD (gastroesophageal reflux disease) 10/25/2019  . Allergic rhinitis 10/25/2019  . Chronic constipation 10/25/2019  . Hypothyroidism   . Normocytic anemia 09/15/2019  . Wound of left groin 04/27/2019  . 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 Chronic venous insufficiency 11/21/2017  . Displacement of lumbar intervertebral disc without myelopathy 07/14/2017  . Radicular low back pain 05/25/2014  . Depression, major, recurrent ([place]) 06/17/2013  . OCD (obsessive compulsive disorder) 11/20/2012  . 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 Bipolar 1 disorder ([place]) 09/25/2012  . History of recurrent deep vein thrombosis (DVT) 08/18/2012    is allergic to aripiprazole, seroquel [quetiapine fumarate], chlorpromazine, and gabapentin.  MEDICAL HISTORY: Past Medical History:  Diagnosis Date  . ADD (attention deficit disorder)   . Anxiety   . Back pain   . Bilateral swelling of feet   . Bipolar 1 disorder ([place])   . Bipolar 1 disorder ([place])   . Bipolar disorder ([place])   . Chewing difficulty   . Chronic fatigue syndrome   . Chronic kidney disease    Stage 3 kidney disease;dx by Dr. Sinda Du.   . Constipation   . Depression   . Diabetes mellitus without complication ([place])   . Dyspnea    with exertion  .  GERD (gastroesophageal reflux disease)   . Headache    migraines  . High cholesterol   . History of blood clots   . History of DVT (deep vein thrombosis)    left leg  . Hypertension    states under  control with meds., has been on med. x 2 years  . Hypothyroidism   . Joint pain   . Neuropathy   . Obsessive-compulsive disorder   . Peripheral vascular disease ([place])   . Prediabetes   . Respiratory failure requiring intubation ([place])   . Restless leg syndrome   . Shortness of breath   . Sleep apnea   . Swallowing difficulty   . Thrombocytopenia ([place]) 09/15/2019  . Trigger thumb of left hand 01/2018  . Trigger thumb of right hand     SURGICAL HISTORY: Past Surgical History:  Procedure Laterality Date  . ABDOMINAL HYSTERECTOMY  06/2016   complete  . APPLICATION OF WOUND VAC Left 04/27/2019   Procedure: APPLICATION OF WOUND VAC LEFT GROIN;  Surgeon: Waynetta Sandy, MD;  Location: [facility];  Service: Vascular;  Laterality: Left;  . AV FISTULA PLACEMENT Left 11/24/2018   Procedure: ARTERIOVENOUS (AV) FISTULA CREATION LEFT SFA TO LEFT FEMORAL VEIN;  Surgeon: Waynetta Sandy, MD;  Location: [facility];  Service: Vascular;  Laterality: Left;  . BACK SURGERY    . CHOLECYSTECTOMY    . COLONOSCOPY WITH PROPOFOL N/A 11/22/2019   Procedure: COLONOSCOPY WITH PROPOFOL;  Surgeon: [name], MD;  Location: [facility];  Service: Gastroenterology;  Laterality: N/A;  . FEMORAL ARTERY EXPLORATION  03/30/2019   Procedure: Left Common Femoral Artery and Vein Exploration;  Surgeon: Waynetta Sandy, MD;  Location: [facility];  Service: Vascular;;  . FEMORAL-FEMORAL BYPASS GRAFT Left 11/24/2018   Procedure: BYPASS GRAFT FEMORAL-FEMORAL VENOUS LEFT TO RIGHT PALMA PROCEDURE USING CRYOVEIN;  Surgeon: Waynetta Sandy, MD;  Location: [facility];  Service: Vascular;  Laterality: Left;  . GROIN DEBRIDEMENT Left 04/27/2019   Procedure: GROIN DEBRIDEMENT;  Surgeon: Waynetta Sandy, MD;  Location: [facility];  Service: Vascular;  Laterality: Left;  . INSERTION OF ILIAC STENT  03/30/2019   Procedure: Stent of left common, external iliac veins and left common femoral vein;  Surgeon: Waynetta Sandy, MD;  Location: [facility];  Service: Vascular;;  . LOWER EXTREMITY VENOGRAPHY N/A 08/17/2018   Procedure: LOWER EXTREMITY VENOGRAPHY - Central Venogram;  Surgeon: Waynetta Sandy, MD;  Location: [facility];  Service: Cardiovascular;  Laterality: N/A;  . LOWER EXTREMITY VENOGRAPHY Bilateral 03/09/2019   Procedure: LOWER EXTREMITY VENOGRAPHY;  Surgeon: Waynetta Sandy, MD;  Location: [facility];  Service: Cardiovascular;  Laterality: Bilateral;  . LOWER EXTREMITY VENOGRAPHY Left 08/16/2019   Procedure: LOWER EXTREMITY VENOGRAPHY;  Surgeon: Waynetta Sandy, MD;  Location: [facility];  Service: Cardiovascular;  Laterality: Left;  . LUMBAR FUSION  11/21/2000   L5-S1  . LUMBAR SPINE SURGERY     x 2 others  . PATCH ANGIOPLASTY Left 03/30/2019   Procedure: Patch Angioplasty of the Left Common Femoral Vein using Venosure Biologic patch;  Surgeon: Waynetta Sandy, MD;  Location: [facility];  Service: Vascular;  Laterality: Left;  . PERIPHERAL VASCULAR INTERVENTION Left 08/16/2019   Procedure: PERIPHERAL VASCULAR INTERVENTION;  Surgeon: Waynetta Sandy, MD;  Location: [facility];  Service: Cardiovascular;  Laterality: Left;  common femoral/femoral vein stent  . TRIGGER FINGER RELEASE Right 12/01/2017   Procedure: RELEASE TRIGGER FINGER/A-1 PULLEY  RIGHT THUMB;  Surgeon: Leanora Cover, MD;  Location: [facility];  Service: Orthopedics;  Laterality: Right;  . TRIGGER FINGER RELEASE Left 01/26/2018   Procedure: LEFT TRIGGER THUMB RELEASE;  Surgeon: Leanora Cover, MD;  Location: [facility];  Service: Orthopedics;  Laterality: Left;  . ULTRASOUND GUIDANCE FOR VASCULAR ACCESS Right 03/30/2019   Procedure: Ultrasound-guided cannulation right internal jugular vein;  Surgeon: Waynetta Sandy, MD;  Location: [facility];  Service: Vascular;  Laterality: Right;    SOCIAL HISTORY: Social History    Socioeconomic History  . Marital status: Divorced    Spouse name: Not on file  . Number of children: 3  . Years of education: Not on file  . Highest education level: Not on file  Occupational History  . Not on file  Tobacco Use  . Smoking status: Never Smoker  . Smokeless tobacco: Never Used  Vaping Use  . Vaping Use: Never used  Substance and Sexual Activity  . Alcohol use: No  . Drug use: No  . Sexual activity: Not Currently    Partners: Male    Birth control/protection: Surgical  Other Topics Concern  . Not on file  Social History Narrative  . Not on file   Social Determinants of Health   Financial Resource Strain:   . Difficulty of Paying Living Expenses: Not on file  Food Insecurity:   . Worried About Charity fundraiser in the Last Year: Not on file  . Ran Out of Food in the Last Year: Not on file  Transportation Needs:   . Lack of Transportation (Medical): Not on file  . Lack of Transportation (Non-Medical): Not on file  Physical Activity:   . Days of Exercise per Week: Not on file  . Minutes of Exercise per Session: Not on file  Stress:   . Feeling of Stress : Not on file  Social Connections:   . Frequency of Communication with Friends and Family: Not on file  . Frequency of Social Gatherings with Friends and Family: Not on file  . Attends Religious Services: Not on file  . Active Member of Clubs or Organizations: Not on file  . Attends Archivist Meetings: Not on file  . Marital Status: Not on file  Intimate Partner Violence:   . Fear of Current or Ex-Partner: Not on file  . Emotionally Abused: Not on file  . Physically Abused: Not on file  . Sexually Abused: Not on file    FAMILY HISTORY: Family History  Problem Relation Age of Onset  . Heart disease Mother   . Hyperlipidemia Mother   . Hypertension Mother   . Bipolar disorder Mother   . Stroke Mother   . Depression Mother   . Sleep apnea Mother   . Obesity Mother   . Diabetes  Father   . Heart disease Father   . Hyperlipidemia Father   . Hypertension Father   . Sleep apnea Father   . Obesity Father   . Drug abuse Daughter   . ADD / ADHD Daughter   . Drug abuse Daughter   . Anxiety disorder Daughter   . Bipolar disorder Daughter   . Hypertension Sister   . Hypertension Brother   . Hyperlipidemia Brother   . Heart disease Brother   . Bipolar disorder Maternal Aunt   . Suicidality Maternal Aunt     Review of Systems  Constitutional: Positive for fatigue.  All other systems reviewed and are negative.  Vital signs: -Deferred due to telephone visit  Physical Exam -Deferred due to telephone visit -Patient was alert and oriented with phone in no acute distress   LABORATORY DATA:  CBC    Component Value Date/Time   WBC 7.3 07/18/2020 1434   RBC 4.35 07/18/2020 1434   HGB 13.2 07/18/2020 1434   HGB 13.2 03/29/2020 1250   HCT 40.5 07/18/2020 1434   HCT 39.6 03/29/2020 1250   PLT 135 (L) 07/18/2020 1434   PLT 142 (L) 03/29/2020 1250   MCV 93.1 07/18/2020 1434   MCV 94 03/29/2020 1250   MCH 30.3 07/18/2020 1434   MCHC 32.6 07/18/2020 1434   RDW 13.5 07/18/2020 1434   RDW 12.6 03/29/2020 1250   LYMPHSABS 1.6 07/18/2020 1434   LYMPHSABS 1.4 03/29/2020 1250   MONOABS 0.6 07/18/2020 1434   EOSABS 0.1 07/18/2020 1434   EOSABS 0.2 03/29/2020 1250   BASOSABS 0.0 07/18/2020 1434   BASOSABS 0.0 03/29/2020 1250    CMP     Component Value Date/Time   NA 140 07/18/2020 1434   NA 142 03/29/2020 1250   K 4.6 07/18/2020 1434   CL 104 07/18/2020 1434   CO2 25 07/18/2020 1434   GLUCOSE 109 (H) 07/18/2020 1434   BUN 35 (H) 07/18/2020 1434   BUN 24 03/29/2020 1250   CREATININE 1.32 (H) 07/18/2020 1434   CALCIUM 9.6 07/18/2020 1434   PROT 7.4 07/18/2020 1434   PROT 6.9 03/29/2020 1250   ALBUMIN 3.9 07/18/2020 1434   ALBUMIN 4.1 03/29/2020 1250   AST 18 07/18/2020 1434   ALT 19 07/18/2020 1434   ALKPHOS 102 07/18/2020 1434   BILITOT 0.9  07/18/2020 1434   BILITOT 0.3 03/29/2020 1250   GFRNONAA 46 (L) 07/18/2020 1434   GFRAA 53 (L) 07/18/2020 1434    All questions were answered to patient's stated satisfaction. Encouraged patient to call with any new concerns or questions before his next visit to the cancer center and we can certain see him sooner, if needed.      ASSESSMENT and THERAPY PLAN:   Normocytic anemia 1.  Normocytic anemia: -This is a combination anemia from CKD and iron deficiency. -Colonoscopy on 11/22/2019 showed 3 small polyps in the ascending colon and the cecum.  Nonbleeding internal hemorrhoids.  Pathology consistent with tubular adenoma. -She has taken iron pills in the past without much improvement. -She has a history of blood transfusions in the past. -Last Feraheme infusion was 04/26/2020 and 05/03/2020 -Labs done on 07/18/2020 showed hemoglobin 13.2, ferritin 266, percent saturation 29, creatinine 1.32 -Patient reports she is still having some fatigue however the iron infusions helped some.  -We will hold off on infusions at this time.  -We will see her back and 3 months with repeat labs  2.  Recurrent left leg DVT: -She had her first episode of left leg DVT in 2014. -Recurrent episode was while taking Xarelto. -She is currently on Coumadin.   Orders Placed This Encounter  Procedures  . CBC with Differential/Platelet    Standing Status:   Future    Standing Expiration Date:   07/20/2021  . Comprehensive metabolic panel    Standing Status:   Future    Standing Expiration Date:   07/20/2021  . Ferritin    Standing Status:   Future    Standing Expiration Date:   07/20/2021  . Iron and TIBC    Standing Status:   Future    Standing Expiration Date:   07/20/2021  .  Lactate dehydrogenase    Standing Status:   Future    Standing Expiration Date:   07/20/2021  . Vitamin B12    Standing Status:   Future    Standing Expiration Date:   07/20/2021  . VITAMIN D 25 Hydroxy (Vit-D Deficiency, Fractures)     Standing Status:   Future    Standing Expiration Date:   07/20/2021  . Folate    Standing Status:   Future    Standing Expiration Date:   07/20/2021    All questions were answered. The patient knows to call the clinic with any problems, questions or concerns. We can certainly see the patient much sooner if necessary. This note was electronically signed.   I provided 28 minutes of non face-to-face telephone visit time during this encounter, and > 50% was spent counseling as documented under my assessment & plan.   Glennie Isle, NP-C 07/20/2020

## 2020-07-20 NOTE — Assessment & Plan Note (Signed)
1.  Normocytic anemia: -This is a combination anemia from CKD and iron deficiency. -Colonoscopy on 11/22/2019 showed 3 small polyps in the ascending colon and the cecum.  Nonbleeding internal hemorrhoids.  Pathology consistent with tubular adenoma. -She has taken iron pills in the past without much improvement. -She has a history of blood transfusions in the past. -Last Feraheme infusion was 04/26/2020 and 05/03/2020 -Labs done on 07/18/2020 showed hemoglobin 13.2, ferritin 266, percent saturation 29, creatinine 1.32 -Patient reports she is still having some fatigue however the iron infusions helped some.  -We will hold off on infusions at this time.  -We will see her back and 3 months with repeat labs  2.  Recurrent left leg DVT: -She had her first episode of left leg DVT in 2014. -Recurrent episode was while taking Xarelto. -She is currently on Coumadin.

## 2020-07-21 DIAGNOSIS — E039 Hypothyroidism, unspecified: Secondary | ICD-10-CM | POA: Diagnosis not present

## 2020-07-22 DIAGNOSIS — E039 Hypothyroidism, unspecified: Secondary | ICD-10-CM | POA: Diagnosis not present

## 2020-07-23 DIAGNOSIS — E039 Hypothyroidism, unspecified: Secondary | ICD-10-CM | POA: Diagnosis not present

## 2020-07-24 DIAGNOSIS — E039 Hypothyroidism, unspecified: Secondary | ICD-10-CM | POA: Diagnosis not present

## 2020-07-25 ENCOUNTER — Ambulatory Visit (INDEPENDENT_AMBULATORY_CARE_PROVIDER_SITE_OTHER): Payer: Medicaid Other | Admitting: Family Medicine

## 2020-07-25 ENCOUNTER — Telehealth: Payer: Self-pay | Admitting: *Deleted

## 2020-07-25 ENCOUNTER — Encounter (INDEPENDENT_AMBULATORY_CARE_PROVIDER_SITE_OTHER): Payer: Self-pay | Admitting: Family Medicine

## 2020-07-25 ENCOUNTER — Other Ambulatory Visit: Payer: Self-pay

## 2020-07-25 VITALS — BP 110/65 | HR 79 | Temp 97.8°F | Ht 70.0 in | Wt 259.0 lb

## 2020-07-25 DIAGNOSIS — E038 Other specified hypothyroidism: Secondary | ICD-10-CM | POA: Diagnosis not present

## 2020-07-25 DIAGNOSIS — E039 Hypothyroidism, unspecified: Secondary | ICD-10-CM | POA: Diagnosis not present

## 2020-07-25 DIAGNOSIS — Z6837 Body mass index (BMI) 37.0-37.9, adult: Secondary | ICD-10-CM

## 2020-07-25 DIAGNOSIS — E559 Vitamin D deficiency, unspecified: Secondary | ICD-10-CM

## 2020-07-25 DIAGNOSIS — E1165 Type 2 diabetes mellitus with hyperglycemia: Secondary | ICD-10-CM | POA: Diagnosis not present

## 2020-07-25 NOTE — Telephone Encounter (Signed)
Patient called stating she was having some swelling of her left thigh for several weeks. Sent Dr Donzetta Matters a message he responded to schedule patient for IVC Iliac study and appt to see him. Patient appts scheduled. Patient aware and given instructions for [place].

## 2020-07-26 ENCOUNTER — Ambulatory Visit (INDEPENDENT_AMBULATORY_CARE_PROVIDER_SITE_OTHER): Payer: Medicaid Other | Admitting: Family Medicine

## 2020-07-26 ENCOUNTER — Telehealth: Payer: Self-pay | Admitting: Family Medicine

## 2020-07-26 ENCOUNTER — Other Ambulatory Visit: Payer: Self-pay | Admitting: *Deleted

## 2020-07-26 DIAGNOSIS — I871 Compression of vein: Secondary | ICD-10-CM

## 2020-07-26 DIAGNOSIS — M7989 Other specified soft tissue disorders: Secondary | ICD-10-CM

## 2020-07-26 DIAGNOSIS — E039 Hypothyroidism, unspecified: Secondary | ICD-10-CM | POA: Diagnosis not present

## 2020-07-26 NOTE — Progress Notes (Signed)
Chief Complaint:   OBESITY Jane [name] is here to discuss her progress with her obesity treatment plan along with follow-up of her obesity related diagnoses. Neal is on the Category 4 Plan and states she is following her eating plan approximately 75% of the time. Adabelle states she is doing 0 minutes 0 times per week.  Today's visit was #: 8 Starting weight: 283 lbs Starting date: 03/29/2020 Today's weight: 259 lbs Today's date: 07/25/2020 Total lbs lost to date: 24 Total lbs lost since last in-office visit: 0  Interim History: Jane [name] is in quite a bit of pain, knee and leg pain. She was hoping to get knee surgery but the hospital is full so she put off surgery. She had an episode of indulgent eating with pizza and cake. Her birthday is coming  Up, she is not sure of her plans yet. She does know she wont stay on the plan on her birthday.  Subjective:   1. Type 2 diabetes mellitus with hyperglycemia, without long-term current use of insulin (HCC) Jane [name]'s last [name] was 6.2 and insulin 21.5. She is not on medications.  2. Vitamin D deficiency Jane [name] is taking multivitamins, and she denies nausea, vomiting, or muscle weakness but notes fatigue.  3. Other specified hypothyroidism Shayona denies cold/hot intolerance or palpitations. She got labs done by her Endocrinologist last week. TSH was 0.247 on 150 mcg daily levothyroxine.  Assessment/Plan:   1. Type 2 diabetes mellitus with hyperglycemia, without long-term current use of insulin (HCC) Good blood sugar control is important to decrease the likelihood of diabetic complications such as nephropathy, neuropathy, limb loss, blindness, coronary artery disease, and death. Intensive lifestyle modification including diet, exercise and weight loss are the first line of treatment for diabetes. We will repeat labs at her next appointment, and [place] will continue to follow up as directed.  2. Vitamin D deficiency Low Vitamin D level contributes to  fatigue and are associated with obesity, breast, and colon cancer. We will repeat labs in 2 weeks. [place] will follow-up for routine testing of Vitamin D, at least 2-3 times per year to avoid over-replacement.  3. Other specified hypothyroidism Patient with long-standing hypothyroidism, on levothyroxine therapy. Jane [name] appears euthyroid. She is to follow up with Endocrinology for further medication adjustments. Orders and follow up as documented in patient record.  Counseling . Good thyroid control is important for overall health. Supratherapeutic thyroid levels are dangerous and will not improve weight loss results. . The correct way to take levothyroxine is fasting, with water, separated by at least 30 minutes from breakfast, and separated by more than 4 hours from calcium, iron, multivitamins, acid reflux medications (PPIs).   4. Class 2 severe obesity with serious comorbidity and body mass index (BMI) of 37.0 to 37.9 in adult, unspecified obesity type (HCC) Jane [name] is currently in the action stage of change. As such, her goal is to continue with weight loss efforts. She has agreed to the Category 4 Plan.   Exercise goals: No exercise has been prescribed at this time.  Behavioral modification strategies: increasing lean protein intake, increasing vegetables, meal planning and cooking strategies, keeping healthy foods in the home and planning for success.  Jane [name] has agreed to follow-up with our clinic in 2 to 3 weeks. She was informed of the importance of frequent follow-up visits to maximize her success with intensive lifestyle modifications for her multiple health conditions.   Objective:   Blood pressure 110/65, pulse 79, temperature 97.8 F (36.6 C), temperature  source Oral, height 5\' 10"  (1.778 m), weight 259 lb (117.5 kg), SpO2 96 %. Body mass index is 37.16 kg/m.  General: Cooperative, alert, well developed, in no acute distress. HEENT: Conjunctivae and lids  unremarkable. Cardiovascular: Regular rhythm.  Lungs: Normal work of breathing. Neurologic: No focal deficits.   Lab Results  Component Value Date   CREATININE 1.32 (H) 07/18/2020   BUN 35 (H) 07/18/2020   NA 140 07/18/2020   K 4.6 07/18/2020   CL 104 07/18/2020   CO2 25 07/18/2020   Lab Results  Component Value Date   ALT 19 07/18/2020   AST 18 07/18/2020   ALKPHOS 102 07/18/2020   BILITOT 0.9 07/18/2020   Lab Results  Component Value Date   HGBA1C 6.2 (H) 03/29/2020   HGBA1C 6.3 (H) 10/25/2019   HGBA1C 6.3 (H) 03/26/2019   HGBA1C 7.5 11/18/2018   Lab Results  Component Value Date   INSULIN 21.5 03/29/2020   Lab Results  Component Value Date   TSH 0.107 (L) 05/18/2020   Lab Results  Component Value Date   CHOL 203 (H) 03/29/2020   HDL 37 (L) 03/29/2020   LDLCALC 114 (H) 03/29/2020   TRIG 296 (H) 03/29/2020   CHOLHDL 5.0 (H) 10/25/2019   Lab Results  Component Value Date   WBC 7.3 07/18/2020   HGB 13.2 07/18/2020   HCT 40.5 07/18/2020   MCV 93.1 07/18/2020   PLT 135 (L) 07/18/2020   Lab Results  Component Value Date   IRON 81 07/18/2020   TIBC 279 07/18/2020   FERRITIN 266 07/18/2020   Attestation Statements:   Reviewed by clinician on day of visit: allergies, medications, problem list, medical history, surgical history, family history, social history, and previous encounter notes.  Time spent on visit including pre-visit chart review and post-visit care and charting was 16 minutes.    I, Trixie Dredge, am acting as transcriptionist for Coralie Common, MD.  I have reviewed the above documentation for accuracy and completeness, and I agree with the above. - Jinny Blossom, MD

## 2020-07-26 NOTE — Telephone Encounter (Signed)
Copied from [place] [id number]. Topic: Referral - Request for Referral >> Jul 26, 2020  2:11 PM Leward Quan A wrote: Has patient seen PCP for this complaint?  *If NO, is insurance requiring patient see PCP for this issue before PCP can refer them? Referral for which specialty: Vascular [facility] Vein  Preferred provider/office: Dr Donzetta Matters  Reason for referral: veins and artery  Appointment on 08/04/20 for visit

## 2020-07-26 NOTE — Telephone Encounter (Signed)
She is already seen there, but we can renew her referral if needed. Ok to order

## 2020-07-27 DIAGNOSIS — E039 Hypothyroidism, unspecified: Secondary | ICD-10-CM | POA: Diagnosis not present

## 2020-07-27 NOTE — Telephone Encounter (Signed)
Copied from [place] [id number]. Topic: Referral - Request for Referral >> Jul 26, 2020  2:11 PM Leward Quan A wrote: Has patient seen PCP for this complaint? Yes.   *If NO, is insurance requiring patient see PCP for this issue before PCP can refer them? Referral for which specialty: Vascular and Vein  Preferred provider/office: Dr Donzetta Matters  Reason for referral: veins and artery  Appointment on 08/04/20 for visit

## 2020-07-27 NOTE — Telephone Encounter (Signed)
Pt states she needs a new referral from Dr. Jacinto Reap.    Thanks,   -Mickel Baas

## 2020-07-28 DIAGNOSIS — E039 Hypothyroidism, unspecified: Secondary | ICD-10-CM | POA: Diagnosis not present

## 2020-07-29 DIAGNOSIS — E039 Hypothyroidism, unspecified: Secondary | ICD-10-CM | POA: Diagnosis not present

## 2020-07-30 DIAGNOSIS — E039 Hypothyroidism, unspecified: Secondary | ICD-10-CM | POA: Diagnosis not present

## 2020-07-31 DIAGNOSIS — E039 Hypothyroidism, unspecified: Secondary | ICD-10-CM | POA: Diagnosis not present

## 2020-08-01 DIAGNOSIS — E039 Hypothyroidism, unspecified: Secondary | ICD-10-CM | POA: Diagnosis not present

## 2020-08-02 ENCOUNTER — Other Ambulatory Visit: Payer: Self-pay

## 2020-08-02 ENCOUNTER — Ambulatory Visit (INDEPENDENT_AMBULATORY_CARE_PROVIDER_SITE_OTHER): Payer: Medicaid Other

## 2020-08-02 DIAGNOSIS — E039 Hypothyroidism, unspecified: Secondary | ICD-10-CM | POA: Diagnosis not present

## 2020-08-02 DIAGNOSIS — Z86718 Personal history of other venous thrombosis and embolism: Secondary | ICD-10-CM | POA: Diagnosis not present

## 2020-08-02 LAB — POCT INR
INR: 2.5 (ref 2.0–3.0)
Prothrombin Time: 29.6

## 2020-08-02 NOTE — Patient Instructions (Signed)
Description    5mg  daily except 2.5mg  Mondays and Fridays.  Recheck in two weeks per patient request.

## 2020-08-03 DIAGNOSIS — E039 Hypothyroidism, unspecified: Secondary | ICD-10-CM | POA: Diagnosis not present

## 2020-08-04 ENCOUNTER — Encounter: Payer: Self-pay | Admitting: Vascular Surgery

## 2020-08-04 ENCOUNTER — Other Ambulatory Visit: Payer: Self-pay

## 2020-08-04 ENCOUNTER — Ambulatory Visit (HOSPITAL_COMMUNITY)
Admission: RE | Admit: 2020-08-04 | Discharge: 2020-08-04 | Disposition: A | Discharge status: Home / Self Care | Payer: Medicaid Other | Source: Ambulatory Visit | Attending: Vascular Surgery | Admitting: Vascular Surgery

## 2020-08-04 ENCOUNTER — Ambulatory Visit (INDEPENDENT_AMBULATORY_CARE_PROVIDER_SITE_OTHER): Payer: Medicaid Other | Admitting: Vascular Surgery

## 2020-08-04 VITALS — BP 126/78 | HR 50 | Temp 97.7°F | Resp 20 | Ht 70.0 in | Wt 264.0 lb

## 2020-08-04 DIAGNOSIS — M7989 Other specified soft tissue disorders: Secondary | ICD-10-CM

## 2020-08-04 DIAGNOSIS — E039 Hypothyroidism, unspecified: Secondary | ICD-10-CM | POA: Diagnosis not present

## 2020-08-04 DIAGNOSIS — I871 Compression of vein: Secondary | ICD-10-CM

## 2020-08-04 DIAGNOSIS — I87002 Postthrombotic syndrome without complications of left lower extremity: Secondary | ICD-10-CM

## 2020-08-04 NOTE — Progress Notes (Signed)
Patient ID: Jane [name], female   DOB: [birth year], 54 y.o.   MRN: [id number]  Reason for Consult: Follow-up   Referred by [name] Crews, MD  Subjective:     HPI:  Jane [name] is a 54 y.o. 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  Most recently she underwent balloon angioplasty of her left common and external iliac vein stents and stenting down to her common femoral and femoral veins.  She also has a known AV fistula on the left side.  More recently she has had a meniscal tear has had swelling of her left thigh with some swelling down to the leg.  She does not wear compression stockings.  She does take Coumadin as she previously failed novel oral anticoagulation agents.  She does walk without limitation.  She has limited skin changes on the left no tissue loss or ulceration she is able to walk without any significant limitation.  Past Medical History:  Diagnosis Date  . ADD (attention deficit disorder)   . Anxiety   . Back pain   . Bilateral swelling of feet   . Bipolar 1 disorder ([place])   . Bipolar 1 disorder ([place])   . Bipolar disorder ([place])   . Chewing difficulty   . Chronic fatigue syndrome   . Chronic kidney disease    Stage 3 kidney disease;dx by Dr. Sinda Du.   . Constipation   . Depression   . Diabetes mellitus without complication ([place])   . Dyspnea    with exertion  . GERD (gastroesophageal reflux disease)   . Headache    migraines  . High cholesterol   . History of blood clots   . History of DVT (deep vein thrombosis)    left leg  . Hypertension    states under control with meds., has been on med. x 2 years  . Hypothyroidism   . Joint pain   . Neuropathy   . Obsessive-compulsive disorder   . Peripheral vascular disease ([place])   . Prediabetes   . Respiratory failure requiring intubation ([place])   . Restless leg syndrome   . Shortness of breath   . Sleep  apnea   . Swallowing difficulty   . Thrombocytopenia ([place]) 09/15/2019  . Trigger thumb of left hand 01/2018  . Trigger thumb of right hand    Family History  Problem Relation Age of Onset  . Heart disease Mother   . Hyperlipidemia Mother   . Hypertension Mother   . Bipolar disorder Mother   . Stroke Mother   . Depression Mother   . Sleep apnea Mother   . Obesity Mother   . Diabetes Father   . Heart disease Father   . Hyperlipidemia Father   . Hypertension Father   . Sleep apnea Father   . Obesity Father   . Drug abuse Daughter   . ADD / ADHD Daughter   . Drug abuse Daughter   . Anxiety disorder Daughter   . Bipolar disorder Daughter   . Hypertension Sister   . Hypertension Brother   . Hyperlipidemia Brother   . Heart disease Brother   . Bipolar disorder Maternal Aunt   . Suicidality Maternal Aunt    Past Surgical History:  Procedure Laterality Date  . ABDOMINAL HYSTERECTOMY  06/2016   complete  . APPLICATION OF WOUND VAC Left 04/27/2019   Procedure: APPLICATION OF WOUND VAC LEFT GROIN;  Surgeon: Servando Snare  Harrell Gave, MD;  Location: [place];  Service: Vascular;  Laterality: Left;  . AV FISTULA PLACEMENT Left 11/24/2018   Procedure: ARTERIOVENOUS (AV) FISTULA CREATION LEFT SFA TO LEFT FEMORAL VEIN;  Surgeon: Waynetta Sandy, MD;  Location: [place];  Service: Vascular;  Laterality: Left;  . BACK SURGERY    . CHOLECYSTECTOMY    . COLONOSCOPY WITH PROPOFOL N/A 11/22/2019   Procedure: COLONOSCOPY WITH PROPOFOL;  Surgeon: [name], MD;  Location: [facility];  Service: Gastroenterology;  Laterality: N/A;  . FEMORAL ARTERY EXPLORATION  03/30/2019   Procedure: Left Common Femoral Artery and Vein Exploration;  Surgeon: Waynetta Sandy, MD;  Location: [place];  Service: Vascular;;  . FEMORAL-FEMORAL BYPASS GRAFT Left 11/24/2018   Procedure: BYPASS GRAFT FEMORAL-FEMORAL VENOUS LEFT TO RIGHT PALMA PROCEDURE USING CRYOVEIN;  Surgeon: Waynetta Sandy,  MD;  Location: [place];  Service: Vascular;  Laterality: Left;  . GROIN DEBRIDEMENT Left 04/27/2019   Procedure: GROIN DEBRIDEMENT;  Surgeon: Waynetta Sandy, MD;  Location: [place];  Service: Vascular;  Laterality: Left;  . INSERTION OF ILIAC STENT  03/30/2019   Procedure: Stent of left common, external iliac veins and left common femoral vein;  Surgeon: Waynetta Sandy, MD;  Location: [place];  Service: Vascular;;  . LOWER EXTREMITY VENOGRAPHY N/A 08/17/2018   Procedure: LOWER EXTREMITY VENOGRAPHY - Central Venogram;  Surgeon: Waynetta Sandy, MD;  Location: [place] CV [facility];  Service: Cardiovascular;  Laterality: N/A;  . LOWER EXTREMITY VENOGRAPHY Bilateral 03/09/2019   Procedure: LOWER EXTREMITY VENOGRAPHY;  Surgeon: Waynetta Sandy, MD;  Location: [place] [facility];  Service: Cardiovascular;  Laterality: Bilateral;  . LOWER EXTREMITY VENOGRAPHY Left 08/16/2019   Procedure: LOWER EXTREMITY VENOGRAPHY;  Surgeon: Waynetta Sandy, MD;  Location: [place] CV [facility];  Service: Cardiovascular;  Laterality: Left;  . LUMBAR FUSION  11/21/2000   L5-S1  . LUMBAR SPINE SURGERY     x 2 others  . PATCH ANGIOPLASTY Left 03/30/2019   Procedure: Patch Angioplasty of the Left Common Femoral Vein using Venosure Biologic patch;  Surgeon: Waynetta Sandy, MD;  Location: [place];  Service: Vascular;  Laterality: Left;  . PERIPHERAL VASCULAR INTERVENTION Left 08/16/2019   Procedure: PERIPHERAL VASCULAR INTERVENTION;  Surgeon: Waynetta Sandy, MD;  Location: [place] CV [facility];  Service: Cardiovascular;  Laterality: Left;  common femoral/femoral vein stent  . TRIGGER FINGER RELEASE Right 12/01/2017   Procedure: RELEASE TRIGGER FINGER/A-1 PULLEY RIGHT THUMB;  Surgeon: Leanora Cover, MD;  Location: [place];  Service: Orthopedics;  Laterality: Right;  . TRIGGER FINGER RELEASE Left 01/26/2018   Procedure: LEFT TRIGGER THUMB RELEASE;  Surgeon:  Leanora Cover, MD;  Location: [place];  Service: Orthopedics;  Laterality: Left;  . ULTRASOUND GUIDANCE FOR VASCULAR ACCESS Right 03/30/2019   Procedure: Ultrasound-guided cannulation right internal jugular vein;  Surgeon: Waynetta Sandy, MD;  Location: [place];  Service: Vascular;  Laterality: Right;    Short Social History:  Social History   Tobacco Use  . Smoking status: Never Smoker  . Smokeless tobacco: Never Used  Substance Use Topics  . Alcohol use: No    Allergies  Allergen Reactions  . Aripiprazole Other (See Comments)    BECOMES  VIOLENT   . Seroquel [Quetiapine Fumarate] Other (See Comments)    BECOMES VIOLENT  . Chlorpromazine Other (See Comments)    SEVERE ANXIETY   . Gabapentin Other (See Comments)    NIGHTMARES  Current Outpatient Medications  Medication Sig Dispense Refill  . ALPRAZolam (XANAX) 1 MG tablet Take 1 tablet (1 mg total) by mouth 2 (two) times daily as needed for anxiety. 60 tablet 2  . amphetamine-dextroamphetamine (ADDERALL) 30 MG tablet Take 1 tablet by mouth 2 (two) times daily. 60 tablet 0  . amphetamine-dextroamphetamine (ADDERALL) 30 MG tablet Take 1 tablet by mouth 2 (two) times daily. 60 tablet 0  . amphetamine-dextroamphetamine (ADDERALL) 30 MG tablet Take 1 tablet by mouth 2 (two) times daily. 60 tablet 0  . aspirin EC 81 MG tablet Take 81 mg by mouth daily.    Marland Kitchen atorvastatin (LIPITOR) 20 MG tablet Take 1 tablet (20 mg total) by mouth daily. 90 tablet 3  . cetirizine (ZYRTEC) 10 MG tablet TAKE 1 TABLET BY MOUTH DAILY 90 tablet 1  . cycloSPORINE (RESTASIS) 0.05 % ophthalmic emulsion Place 1 drop into both eyes 2 (two) times daily.    Marland Kitchen FLUoxetine (PROZAC) 40 MG capsule TAKE 1 TABLET BY MOUTH DAILY 90 capsule 2  . GARLIC PO Take 6,073 mg by mouth.     Marland Kitchen HYDROcodone-acetaminophen (NORCO) 10-325 MG tablet Take 1 tablet by mouth every 6 (six) hours as needed for severe pain. Must last 30 days. 120 tablet 0  .  [START ON 08/14/2020] HYDROcodone-acetaminophen (NORCO) 10-325 MG tablet Take 1 tablet by mouth every 6 (six) hours as needed for severe pain. Must last 30 days. 120 tablet 0  . [START ON 09/13/2020] HYDROcodone-acetaminophen (NORCO) 10-325 MG tablet Take 1 tablet by mouth every 6 (six) hours as needed for severe pain. Must last 30 days. 120 tablet 0  . levothyroxine (SYNTHROID) 150 MCG tablet Take 1 tablet (150 mcg total) by mouth daily before breakfast. 30 tablet 0  . linaclotide (LINZESS) 145 MCG CAPS capsule Take 1 capsule (145 mcg total) by mouth daily. 90 capsule 3  . lisinopril (ZESTRIL) 20 MG tablet Take 0.5 tablets (10 mg total) by mouth daily. 90 tablet 0  . Magnesium 250 MG TABS Take 2 tablets (500 mg total) by mouth daily for 90 doses. 60 tablet 2  . methocarbamol (ROBAXIN) 750 MG tablet Take 1 tablet (750 mg total) by mouth 2 (two) times daily as needed for muscle spasms. 60 tablet 2  . Multiple Vitamin (MULTIVITAMIN) capsule Take 1 capsule by mouth daily.    . Olopatadine HCl 0.2 % SOLN Apply 1 drop to eye daily.    . Omega-3 Fatty Acids (OMEGA-3 FISH OIL PO) Take 600 mg by mouth.    . pantoprazole (PROTONIX) 40 MG tablet Take 1 tablet (40 mg total) by mouth daily. 90 tablet 3  . potassium chloride (KLOR-CON) 10 MEQ tablet Take 1 tablet (10 mEq total) by mouth 3 (three) times daily. 270 tablet 3  . pregabalin (LYRICA) 100 MG capsule Take 100 mg by mouth 4 (four) times daily.    Marland Kitchen rOPINIRole (REQUIP) 2 MG tablet Take 1 tablet (2 mg total) by mouth 2 (two) times daily. 180 tablet 3  . warfarin (COUMADIN) 5 MG tablet TAKE 1 AND 1/2 TABLETS BY MOUTH ON TUEDAYS AND THURSDAYS, THEN TAKE 1 TABLET DAILY ON ALL OTHER DAYS (Patient taking differently: Take 5 mg by mouth daily. Every day except Tuesday then 2.5 on Tuesday) 60 tablet 1   No current facility-administered medications for this visit.    Review of Systems  Constitutional:  Constitutional negative. HENT: HENT negative.  Eyes: Eyes  negative.  Respiratory: Respiratory negative.  Cardiovascular: Positive for leg swelling.  GI: Gastrointestinal negative.  Musculoskeletal: Musculoskeletal negative.  Skin: Skin negative.  Neurological: Neurological negative. Hematologic: Hematologic/lymphatic negative.  Psychiatric: Psychiatric negative.        Objective:  Objective  Vitals:   08/04/20 0840  BP: 126/78  Pulse: (!) 50  Resp: 20  Temp: 97.7 F (36.5 C)  SpO2: 96%    Physical Exam HENT:     Head: Normocephalic.     Nose:     Comments: Wearing a mask Eyes:     Pupils: Pupils are equal, round, and reactive to light.  Cardiovascular:     Rate and Rhythm: Normal rate.     Pulses: Normal pulses.  Pulmonary:     Effort: Pulmonary effort is normal.  Abdominal:     General: Abdomen is flat.  Musculoskeletal:     Comments: She does have some thigh swelling although it is not tense, right leg 10 cm below the tibial tuberosity measures 39 cm and left leg is 41 cm  Skin:    General: Skin is warm.     Capillary Refill: Capillary refill takes less than 2 seconds.  Neurological:     General: No focal deficit present.     Mental Status: She is alert.  Psychiatric:        Mood and Affect: Mood normal.        Behavior: Behavior normal.        Thought Content: Thought content normal.        Judgment: Judgment normal.     Data: I have independently interpreted her IVC iliac duplex.  The IVC, iliac vein and external iliac vein stents appear patent.  There does appear to be a stenosis at the distal common femoral vein stent in the AV fistula is identified by waveform analysis.     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  She does have some swelling of her thigh this appears related to both common femoral stent stenosis as well as a meniscal tear.  Her leg is  really not any larger today than previous.  She does need to wear compression stockings.  She is okay for meniscal surgery from a vascular standpoint but would need to bridge with Lovenox prior.  She will follow-up with me in 3 to 4 months with repeat IVC iliac duplex.  If her symptoms worsen prior to the scheduled follow-up she will need a CT venogram abdomen and pelvis and I can see her sooner if needed.     Waynetta Sandy MD [facility]

## 2020-08-05 DIAGNOSIS — E039 Hypothyroidism, unspecified: Secondary | ICD-10-CM | POA: Diagnosis not present

## 2020-08-06 DIAGNOSIS — E039 Hypothyroidism, unspecified: Secondary | ICD-10-CM | POA: Diagnosis not present

## 2020-08-07 ENCOUNTER — Other Ambulatory Visit: Payer: Self-pay | Admitting: Family Medicine

## 2020-08-07 DIAGNOSIS — E039 Hypothyroidism, unspecified: Secondary | ICD-10-CM | POA: Diagnosis not present

## 2020-08-07 NOTE — Telephone Encounter (Signed)
Requested medication (s) are due for refill today: yes  Requested medication (s) are on the active medication list: yes  Last refill:  05/24/20  Future visit scheduled: yes  Notes to clinic:  med not delegated to NT to RF   Requested Prescriptions  Pending Prescriptions Disp Refills   warfarin (COUMADIN) 5 MG tablet [Pharmacy Med Name: warfarin 5 mg tablet] 60 tablet 1    Sig: TAKE 1 AND 1/2 TABLETS BY MOUTH ON TUEDAYS AND THURSDAYS, THEN TAKE 1 TABLET DAILY ON ALL OTHER DAYS      Hematology:  Anticoagulants - warfarin Failed - 08/07/2020  8:02 AM      Failed - This refill cannot be delegated      Failed - If the patient is managed by Coumadin Clinic - route to their Pool. If not, forward to the provider.      Failed - Valid encounter within last 3 months    Recent Outpatient Visits           3 months ago Essential hypertension   [facility] [place], Dionne Bucy, MD   4 months ago Displacement of lumbar intervertebral disc without myelopathy   [facility] [place], Corinne, PA-C   5 months ago Type 2 diabetes mellitus with stage 3a chronic kidney disease, without long-term current use of insulin [facility])   [facility], Dionne Bucy, MD   5 months ago Acquired hypothyroidism   [facility] Dionne Bucy, MD   6 months ago Encounter for annual physical exam   [facility], Dionne Bucy, MD       Future Appointments             In 2 months Bacigal[place], Dionne Bucy, MD [facility] [place]   In 2 months Derek Jack, MD [place] - INR in normal range and within 30 days    INR  Date Value Ref Range Status  08/02/2020 2.5 2.0 - 3.0 Final  10/25/2019 2.6 (H) 0.9 - 1.2 Final    Comment:    Reference interval is for non-anticoagulated patients. Suggested INR therapeutic range for Vitamin K antagonist therapy:    Standard Dose  (moderate intensity                   therapeutic range):       2.0 - 3.0    Higher intensity therapeutic range       2.5 - 3.5

## 2020-08-08 ENCOUNTER — Encounter (INDEPENDENT_AMBULATORY_CARE_PROVIDER_SITE_OTHER): Payer: Self-pay | Admitting: Family Medicine

## 2020-08-08 ENCOUNTER — Ambulatory Visit (INDEPENDENT_AMBULATORY_CARE_PROVIDER_SITE_OTHER): Payer: Medicaid Other | Admitting: Family Medicine

## 2020-08-08 ENCOUNTER — Other Ambulatory Visit: Payer: Self-pay

## 2020-08-08 ENCOUNTER — Other Ambulatory Visit: Payer: Self-pay | Admitting: *Deleted

## 2020-08-08 VITALS — BP 101/64 | HR 65 | Temp 98.2°F | Ht 70.0 in | Wt 258.0 lb

## 2020-08-08 DIAGNOSIS — I871 Compression of vein: Secondary | ICD-10-CM

## 2020-08-08 DIAGNOSIS — Z9189 Other specified personal risk factors, not elsewhere classified: Secondary | ICD-10-CM

## 2020-08-08 DIAGNOSIS — E559 Vitamin D deficiency, unspecified: Secondary | ICD-10-CM | POA: Diagnosis not present

## 2020-08-08 DIAGNOSIS — R350 Frequency of micturition: Secondary | ICD-10-CM | POA: Diagnosis not present

## 2020-08-08 DIAGNOSIS — Z6837 Body mass index (BMI) 37.0-37.9, adult: Secondary | ICD-10-CM

## 2020-08-08 DIAGNOSIS — E1159 Type 2 diabetes mellitus with other circulatory complications: Secondary | ICD-10-CM

## 2020-08-08 DIAGNOSIS — I152 Hypertension secondary to endocrine disorders: Secondary | ICD-10-CM | POA: Diagnosis not present

## 2020-08-08 DIAGNOSIS — E039 Hypothyroidism, unspecified: Secondary | ICD-10-CM | POA: Diagnosis not present

## 2020-08-08 MED ORDER — VITAMIN D (ERGOCALCIFEROL) 1.25 MG (50000 UNIT) PO CAPS: 50000.0000 [IU] | ORAL_CAPSULE | ORAL | 0 refills | Status: DC

## 2020-08-08 MED ORDER — HYDROCHLOROTHIAZIDE 25 MG PO TABS: 25.0000 mg | ORAL_TABLET | Freq: Every day | ORAL | 0 refills | Status: DC

## 2020-08-08 NOTE — Progress Notes (Signed)
Chief Complaint:   OBESITY Jane [name] is here to discuss her progress with her obesity treatment plan along with follow-up of her obesity related diagnoses. Jane [name] is on the Category 4 Plan and states she is following her eating plan approximately 50% of the time. Jane [name] states she is doing 0 minutes 0 times per week.  Today's visit was #: 9 Starting weight: 283 lbs Starting date: 03/29/2020 Today's weight: 258 lbs Today's date: 08/08/2020 Total lbs lost to date: 25 Total lbs lost since last in-office visit: 1  Interim History: Jane [name] did have a few birthday indulgent meals which included homemade macaroni and cheese. She did see Dr. Donzetta Matters for leg swelling. She is getting her paperwork into [employer] today, so she can start water aerobics. She wants to get back on Category 4 plan.  Subjective:   1. Vitamin D deficiency Jane [name] denies nausea, vomiting, or muscle weakness, but she notes fatigue. She is not on prescription Vit D.  2. Hypertension associated with diabetes ([place]) Jane [name]'s blood pressure is on the lower end today. She denies chest pain, chest pressure, or headache. She is on lisinopril daily.  3. At risk for complication associated with hypotension The patient is at a higher than average risk of hypotension due to being on 2 blood pressure lowering medications, and her blood pressure is borderline low.  Assessment/Plan:   1. Vitamin D deficiency Low Vitamin D level contributes to fatigue and are associated with obesity, breast, and colon cancer. Jane [name] agreed to start prescription Vitamin D 50,000 IU every week with no refills. She will follow-up for routine testing of Vitamin D, at least 2-3 times per year to avoid over-replacement. We will repeat labs in early 2022.  - Vitamin D, Ergocalciferol, (DRISDOL) 1.25 MG (50000 UNIT) CAPS capsule; Take 1 capsule (50,000 Units total) by mouth every 7 (seven) days.  Dispense: 4 capsule; Refill: 0  2. Hypertension associated with diabetes  ([place]) Jane [name] is working on healthy weight loss and exercise to improve blood pressure control. We will watch for signs of hypotension as she continues her lifestyle modifications. If her blood pressure is low again, then we will decrease lisinopril to 5 mg.  - hydrochlorothiazide (HYDRODIURIL) 25 MG tablet; Take 1 tablet (25 mg total) by mouth daily.  Dispense: 30 tablet; Refill: 0  3. At risk for complication associated with hypotension Jane [name] was given approximately 15 minutes of education and counseling today to help avoid hypotension. We discussed risks of hypotension with weight loss and signs of hypotension such as feeling lightheaded or unsteady.  Repetitive spaced learning was employed today to elicit superior memory formation and behavioral change.  4. Class 2 severe obesity with serious comorbidity and body mass index (BMI) of 37.0 to 37.9 in adult, unspecified obesity type (HCC) Jane [name] is currently in the action stage of change. As such, her goal is to continue with weight loss efforts. She has agreed to the Category 4 Plan.   Exercise goals: All adults should avoid inactivity. Some physical activity is better than none, and adults who participate in any amount of physical activity gain some health benefits.  Behavioral modification strategies: increasing lean protein intake, increasing vegetables, meal planning and cooking strategies, keeping healthy foods in the home and planning for success.  Jane [name] has agreed to follow-up with our clinic in 2 to 3 weeks. She was informed of the importance of frequent follow-up visits to maximize her success with intensive lifestyle modifications for her multiple health conditions.  Objective:   Blood pressure 101/64, pulse 65, temperature 98.2 F (36.8 C), temperature source Oral, height 5\' 10"  (1.778 m), weight 258 lb (117 kg), SpO2 97 %. Body mass index is 37.02 kg/m.  General: Cooperative, alert, well developed, in no acute distress. HEENT:  Conjunctivae and lids unremarkable. Cardiovascular: Regular rhythm.  Lungs: Normal work of breathing. Neurologic: No focal deficits.   Lab Results  Component Value Date   CREATININE 1.32 (H) 07/18/2020   BUN 35 (H) 07/18/2020   NA 140 07/18/2020   K 4.6 07/18/2020   CL 104 07/18/2020   CO2 25 07/18/2020   Lab Results  Component Value Date   ALT 19 07/18/2020   AST 18 07/18/2020   ALKPHOS 102 07/18/2020   BILITOT 0.9 07/18/2020   Lab Results  Component Value Date   HGBA1C 6.2 (H) 03/29/2020   HGBA1C 6.3 (H) 10/25/2019   HGBA1C 6.3 (H) 03/26/2019   HGBA1C 7.5 11/18/2018   Lab Results  Component Value Date   INSULIN 21.5 03/29/2020   Lab Results  Component Value Date   TSH 0.107 (L) 05/18/2020   Lab Results  Component Value Date   CHOL 203 (H) 03/29/2020   HDL 37 (L) 03/29/2020   LDLCALC 114 (H) 03/29/2020   TRIG 296 (H) 03/29/2020   CHOLHDL 5.0 (H) 10/25/2019   Lab Results  Component Value Date   WBC 7.3 07/18/2020   HGB 13.2 07/18/2020   HCT 40.5 07/18/2020   MCV 93.1 07/18/2020   PLT 135 (L) 07/18/2020   Lab Results  Component Value Date   IRON 81 07/18/2020   TIBC 279 07/18/2020   FERRITIN 266 07/18/2020   Attestation Statements:   Reviewed by clinician on day of visit: allergies, medications, problem list, medical history, surgical history, family history, social history, and previous encounter notes.   I, Trixie Dredge, am acting as transcriptionist for Coralie Common, MD.  I have reviewed the above documentation for accuracy and completeness, and I agree with the above. Jinny Blossom, MD'

## 2020-08-09 DIAGNOSIS — E039 Hypothyroidism, unspecified: Secondary | ICD-10-CM | POA: Diagnosis not present

## 2020-08-09 MED ORDER — ORPHENADRINE CITRATE 30 MG/ML IJ SOLN
30.0000 mg | Freq: Once | INTRAMUSCULAR | Status: AC
  Administered 2020-07-11: 30 mg via INTRAMUSCULAR

## 2020-08-09 NOTE — Telephone Encounter (Signed)
Please review. Ok to refill?  Thanks!

## 2020-08-09 NOTE — Addendum Note (Signed)
Addended by: Gillis Santa on: 08/09/2020 01:52 PM   Modules accepted: Orders

## 2020-08-10 DIAGNOSIS — E039 Hypothyroidism, unspecified: Secondary | ICD-10-CM | POA: Diagnosis not present

## 2020-08-10 NOTE — Telephone Encounter (Signed)
Refilled

## 2020-08-11 DIAGNOSIS — E039 Hypothyroidism, unspecified: Secondary | ICD-10-CM | POA: Diagnosis not present

## 2020-08-12 DIAGNOSIS — E039 Hypothyroidism, unspecified: Secondary | ICD-10-CM | POA: Diagnosis not present

## 2020-08-13 DIAGNOSIS — E039 Hypothyroidism, unspecified: Secondary | ICD-10-CM | POA: Diagnosis not present

## 2020-08-14 DIAGNOSIS — E039 Hypothyroidism, unspecified: Secondary | ICD-10-CM | POA: Diagnosis not present

## 2020-08-15 ENCOUNTER — Telehealth (HOSPITAL_COMMUNITY): Payer: Self-pay | Admitting: *Deleted

## 2020-08-15 ENCOUNTER — Other Ambulatory Visit (HOSPITAL_COMMUNITY): Payer: Self-pay | Admitting: Psychiatry

## 2020-08-15 DIAGNOSIS — E039 Hypothyroidism, unspecified: Secondary | ICD-10-CM | POA: Diagnosis not present

## 2020-08-15 NOTE — Telephone Encounter (Signed)
Patient called stating she is needing refills for her Adderall and she was suppose to have an appt this week but provider out of office this week. Pt have f/u on 08-21-2020. [facility].

## 2020-08-16 ENCOUNTER — Ambulatory Visit: Payer: Self-pay

## 2020-08-16 ENCOUNTER — Other Ambulatory Visit (HOSPITAL_COMMUNITY): Payer: Self-pay | Admitting: Psychiatry

## 2020-08-16 ENCOUNTER — Ambulatory Visit: Payer: Medicaid Other

## 2020-08-16 ENCOUNTER — Telehealth (HOSPITAL_COMMUNITY): Payer: Self-pay | Admitting: Psychiatry

## 2020-08-16 DIAGNOSIS — E039 Hypothyroidism, unspecified: Secondary | ICD-10-CM | POA: Diagnosis not present

## 2020-08-16 MED ORDER — AMPHETAMINE-DEXTROAMPHETAMINE 30 MG PO TABS: 30.0000 mg | ORAL_TABLET | Freq: Two times a day (BID) | ORAL | 0 refills | Status: DC

## 2020-08-16 NOTE — Telephone Encounter (Signed)
Ordered.   I have utilized the [place] Controlled Substances Reporting System (PMP AWARxE) to confirm adherence regarding the patient's medication. My review reveals appropriate prescription fills.

## 2020-08-16 NOTE — Telephone Encounter (Signed)
Patient aware.

## 2020-08-17 DIAGNOSIS — E039 Hypothyroidism, unspecified: Secondary | ICD-10-CM | POA: Diagnosis not present

## 2020-08-18 DIAGNOSIS — E039 Hypothyroidism, unspecified: Secondary | ICD-10-CM | POA: Diagnosis not present

## 2020-08-19 DIAGNOSIS — E039 Hypothyroidism, unspecified: Secondary | ICD-10-CM | POA: Diagnosis not present

## 2020-08-20 DIAGNOSIS — E039 Hypothyroidism, unspecified: Secondary | ICD-10-CM | POA: Diagnosis not present

## 2020-08-21 ENCOUNTER — Other Ambulatory Visit: Payer: Self-pay

## 2020-08-21 ENCOUNTER — Telehealth (INDEPENDENT_AMBULATORY_CARE_PROVIDER_SITE_OTHER): Payer: Medicaid Other | Admitting: Psychiatry

## 2020-08-21 ENCOUNTER — Encounter (HOSPITAL_COMMUNITY): Payer: Self-pay | Admitting: Psychiatry

## 2020-08-21 DIAGNOSIS — F3130 Bipolar disorder, current episode depressed, mild or moderate severity, unspecified: Secondary | ICD-10-CM | POA: Diagnosis not present

## 2020-08-21 DIAGNOSIS — E039 Hypothyroidism, unspecified: Secondary | ICD-10-CM | POA: Diagnosis not present

## 2020-08-21 MED ORDER — AMPHETAMINE-DEXTROAMPHETAMINE 30 MG PO TABS: 30.0000 mg | ORAL_TABLET | Freq: Two times a day (BID) | ORAL | 0 refills | Status: DC

## 2020-08-21 MED ORDER — FLUOXETINE HCL 40 MG PO CAPS
ORAL_CAPSULE | ORAL | 2 refills | Status: DC
Start: 2020-08-21 — End: 2020-11-21

## 2020-08-21 MED ORDER — ALPRAZOLAM 1 MG PO TABS: 1.0000 mg | ORAL_TABLET | Freq: Two times a day (BID) | ORAL | 2 refills | Status: DC | PRN

## 2020-08-21 NOTE — Progress Notes (Signed)
Virtual Visit via Video Note  I connected with Jane [name] on 08/21/20 at 11:20 AM EDT by a video enabled telemedicine application and verified that I am speaking with the correct person using two identifiers.   I discussed the limitations of evaluation and management by telemedicine and the availability of in person appointments. The patient expressed understanding and agreed to proceed.    I discussed the assessment and treatment plan with the patient. The patient was provided an opportunity to ask questions and all were answered. The patient agreed with the plan and demonstrated an understanding of the instructions.   The patient was advised to call back or seek an in-person evaluation if the symptoms worsen or if the condition fails to improve as anticipated.  I provided 15 minutes of non-face-to-face time during this encounter. Location: Provider office, patient home  Jane Spiller, MD  [facility] MD/PA/NP OP Progress Note  08/21/2020 11:33 AM Jane [name]  MRN:  [id number]  Chief Complaint:  Chief Complaint    Depression; Anxiety; ADD; Follow-up     HPI: This patient is a 54year-old divorced white female who lives alone in [place]. She is on disability due to bipolar disorder.  The patient states that she became very depressed when her mother died in 01-31-2000 of a brain aneurysm. She started getting. Sad but eventually became manic and develop mood swings and agitated behavior. She's been on numerous medications over the years. In 1988-01-31 she also had postpartum depression. For many years she was on lithium but it caused a bad taste in her mouth and her doctor was worried it might be starting to affect her renal function so was discontinued. She also thinks that it stopped working after a number of years  The patient returns for follow-up after 3 months.  She states that overall she has been stable.  She still having lots of medical issues such as stents in her legs, 1 of which she claims  is now closing up.  She also has knee pain that required surgery but she is not able to do it right now because of the coronavirus.  The patient is not vaccinated and does not plan to get the vaccine.  She is focusing well with the Adderall.  She states that she is staying connected with people primarily through phone or video chat and she does not feel particularly lonely.  She denies serious depression anxiety symptoms or suicidal ideation. Visit Diagnosis:    ICD-10-CM   1. Bipolar I disorder, most recent episode depressed ([place])  F31.30     Past Psychiatric History: Long-term outpatient treatment  Past Medical History:  Past Medical History:  Diagnosis Date  . ADD (attention deficit disorder)   . Anxiety   . Back pain   . Bilateral swelling of feet   . Bipolar 1 disorder ([place])   . Bipolar 1 disorder ([place])   . Bipolar disorder ([place])   . Chewing difficulty   . Chronic fatigue syndrome   . Chronic kidney disease    Stage 3 kidney disease;dx by Dr. Sinda Du.   . Constipation   . Depression   . Diabetes mellitus without complication ([place])   . Dyspnea    with exertion  . GERD (gastroesophageal reflux disease)   . Headache    migraines  . High cholesterol   . History of blood clots   . History of DVT (deep vein thrombosis)    left leg  . Hypertension    states under  control with meds., has been on med. x 2 years  . Hypothyroidism   . Joint pain   . Neuropathy   . Obsessive-compulsive disorder   . Peripheral vascular disease ([place])   . Prediabetes   . Respiratory failure requiring intubation ([place])   . Restless leg syndrome   . Shortness of breath   . Sleep apnea   . Swallowing difficulty   . Thrombocytopenia ([place]) 09/15/2019  . Trigger thumb of left hand 01/2018  . Trigger thumb of right hand     Past Surgical History:  Procedure Laterality Date  . ABDOMINAL HYSTERECTOMY  06/2016   complete  . APPLICATION OF WOUND VAC Left 04/27/2019   Procedure: APPLICATION OF  WOUND VAC LEFT GROIN;  Surgeon: Waynetta Sandy, MD;  Location: [place];  Service: Vascular;  Laterality: Left;  . AV FISTULA PLACEMENT Left 11/24/2018   Procedure: ARTERIOVENOUS (AV) FISTULA CREATION LEFT SFA TO LEFT FEMORAL VEIN;  Surgeon: Waynetta Sandy, MD;  Location: [place];  Service: Vascular;  Laterality: Left;  . BACK SURGERY    . CHOLECYSTECTOMY    . COLONOSCOPY WITH PROPOFOL N/A 11/22/2019   Procedure: COLONOSCOPY WITH PROPOFOL;  Surgeon: [name], MD;  Location: [facility] END[facility]COPY;  Service: Gastroenterology;  Laterality: N/A;  . FEMORAL ARTERY EXPLORATION  03/30/2019   Procedure: Left Common Femoral Artery and Vein Exploration;  Surgeon: Waynetta Sandy, MD;  Location: [place];  Service: Vascular;;  . FEMORAL-FEMORAL BYPASS GRAFT Left 11/24/2018   Procedure: BYPASS GRAFT FEMORAL-FEMORAL VENOUS LEFT TO RIGHT PALMA PROCEDURE USING CRYOVEIN;  Surgeon: Waynetta Sandy, MD;  Location: [place];  Service: Vascular;  Laterality: Left;  . GROIN DEBRIDEMENT Left 04/27/2019   Procedure: GROIN DEBRIDEMENT;  Surgeon: Waynetta Sandy, MD;  Location: [place];  Service: Vascular;  Laterality: Left;  . INSERTION OF ILIAC STENT  03/30/2019   Procedure: Stent of left common, external iliac veins and left common femoral vein;  Surgeon: Waynetta Sandy, MD;  Location: [place];  Service: Vascular;;  . LOWER EXTREMITY VENOGRAPHY N/A 08/17/2018   Procedure: LOWER EXTREMITY VENOGRAPHY - Central Venogram;  Surgeon: Waynetta Sandy, MD;  Location: [place] CV LAB;  Service: Cardiovascular;  Laterality: N/A;  . LOWER EXTREMITY VENOGRAPHY Bilateral 03/09/2019   Procedure: LOWER EXTREMITY VENOGRAPHY;  Surgeon: Waynetta Sandy, MD;  Location: [place] CV LAB;  Service: Cardiovascular;  Laterality: Bilateral;  . LOWER EXTREMITY VENOGRAPHY Left 08/16/2019   Procedure: LOWER EXTREMITY VENOGRAPHY;  Surgeon: Waynetta Sandy, MD;  Location: [place] CV LAB;  Service: Cardiovascular;  Laterality: Left;  . LUMBAR FUSION  11/21/2000   L5-S1  . LUMBAR SPINE SURGERY     x 2 others  . PATCH ANGIOPLASTY Left 03/30/2019   Procedure: Patch Angioplasty of the Left Common Femoral Vein using Venosure Biologic patch;  Surgeon: Waynetta Sandy, MD;  Location: [place];  Service: Vascular;  Laterality: Left;  . PERIPHERAL VASCULAR INTERVENTION Left 08/16/2019   Procedure: PERIPHERAL VASCULAR INTERVENTION;  Surgeon: Waynetta Sandy, MD;  Location: [place] CV LAB;  Service: Cardiovascular;  Laterality: Left;  common femoral/femoral vein stent  . TRIGGER FINGER RELEASE Right 12/01/2017   Procedure: RELEASE TRIGGER FINGER/A-1 PULLEY RIGHT THUMB;  Surgeon: Leanora Cover, MD;  Location: [place];  Service: Orthopedics;  Laterality: Right;  . TRIGGER FINGER RELEASE Left 01/26/2018   Procedure: LEFT TRIGGER THUMB RELEASE;  Surgeon: Leanora Cover, MD;  Location: [place];  Service: Orthopedics;  Laterality: Left;  . ULTRASOUND GUIDANCE FOR VASCULAR ACCESS Right 03/30/2019   Procedure: Ultrasound-guided cannulation right internal jugular vein;  Surgeon: Waynetta Sandy, MD;  Location: [facility] OR;  Service: Vascular;  Laterality: Right;    Family Psychiatric History: see below  Family History:  Family History  Problem Relation Age of Onset  . Heart disease Mother   . Hyperlipidemia Mother   . Hypertension Mother   . Bipolar disorder Mother   . Stroke Mother   . Depression Mother   . Sleep apnea Mother   . Obesity Mother   . Diabetes Father   . Heart disease Father   . Hyperlipidemia Father   . Hypertension Father   . Sleep apnea Father   . Obesity Father   . Drug abuse Daughter   . ADD / ADHD Daughter   . Drug abuse Daughter   . Anxiety disorder Daughter   . Bipolar disorder Daughter   . Hypertension Sister   . Hypertension Brother   . Hyperlipidemia Brother   . Heart disease  Brother   . Bipolar disorder Maternal Aunt   . Suicidality Maternal Aunt     Social History:  Social History   Socioeconomic History  . Marital status: Divorced    Spouse name: Not on file  . Number of children: 3  . Years of education: Not on file  . Highest education level: Not on file  Occupational History  . Not on file  Tobacco Use  . Smoking status: Never Smoker  . Smokeless tobacco: Never Used  Vaping Use  . Vaping Use: Never used  Substance and Sexual Activity  . Alcohol use: No  . Drug use: No  . Sexual activity: Not Currently    Partners: Male    Birth control/protection: Surgical  Other Topics Concern  . Not on file  Social History Narrative  . Not on file   Social Determinants of Health   Financial Resource Strain:   . Difficulty of Paying Living Expenses: Not on file  Food Insecurity:   . Worried About Charity fundraiser in the Last Year: Not on file  . Ran Out of Food in the Last Year: Not on file  Transportation Needs:   . Lack of Transportation (Medical): Not on file  . Lack of Transportation (Non-Medical): Not on file  Physical Activity:   . Days of Exercise per Week: Not on file  . Minutes of Exercise per Session: Not on file  Stress:   . Feeling of Stress : Not on file  Social Connections:   . Frequency of Communication with Friends and Family: Not on file  . Frequency of Social Gatherings with Friends and Family: Not on file  . Attends Religious Services: Not on file  . Active Member of Clubs or Organizations: Not on file  . Attends Archivist Meetings: Not on file  . Marital Status: Not on file    Allergies:  Allergies  Allergen Reactions  . Aripiprazole Other (See Comments)    BECOMES  VIOLENT   . Seroquel [Quetiapine Fumarate] Other (See Comments)    BECOMES VIOLENT  . Chlorpromazine Other (See Comments)    SEVERE ANXIETY   . Gabapentin Other (See Comments)    NIGHTMARES     Metabolic Disorder Labs: Lab Results   Component Value Date   HGBA1C 6.2 (H) 03/29/2020   MPG 134.11 03/26/2019   No results found for: PROLACTIN Lab Results  Component Value Date  CHOL 203 (H) 03/29/2020   TRIG 296 (H) 03/29/2020   HDL 37 (L) 03/29/2020   CHOLHDL 5.0 (H) 10/25/2019   LDLCALC 114 (H) 03/29/2020   LDLCALC 127 (H) 10/25/2019   Lab Results  Component Value Date   TSH 0.107 (L) 05/18/2020   TSH 0.275 (L) 03/29/2020    Therapeutic Level Labs: No results found for: LITHIUM Lab Results  Component Value Date   VALPROATE 61.2 03/23/2014   No components found for:  CBMZ  Current Medications: Current Outpatient Medications  Medication Sig Dispense Refill  . ALPRAZolam (XANAX) 1 MG tablet Take 1 tablet (1 mg total) by mouth 2 (two) times daily as needed for anxiety. 60 tablet 2  . amphetamine-dextroamphetamine (ADDERALL) 30 MG tablet Take 1 tablet by mouth 2 (two) times daily. 60 tablet 0  . amphetamine-dextroamphetamine (ADDERALL) 30 MG tablet Take 1 tablet by mouth 2 (two) times daily. 60 tablet 0  . amphetamine-dextroamphetamine (ADDERALL) 30 MG tablet Take 1 tablet by mouth 2 (two) times daily. 60 tablet 0  . aspirin EC 81 MG tablet Take 81 mg by mouth daily.    Marland Kitchen atorvastatin (LIPITOR) 20 MG tablet Take 1 tablet (20 mg total) by mouth daily. 90 tablet 3  . cetirizine (ZYRTEC) 10 MG tablet TAKE 1 TABLET BY MOUTH DAILY 90 tablet 1  . cycloSPORINE (RESTASIS) 0.05 % ophthalmic emulsion Place 1 drop into both eyes 2 (two) times daily.    Marland Kitchen FLUoxetine (PROZAC) 40 MG capsule TAKE 1 TABLET BY MOUTH DAILY 90 capsule 2  . GARLIC PO Take 3,762 mg by mouth.     . hydrochlorothiazide (HYDRODIURIL) 25 MG tablet Take 1 tablet (25 mg total) by mouth daily. 30 tablet 0  . HYDROcodone-acetaminophen (NORCO) 10-325 MG tablet Take 1 tablet by mouth every 6 (six) hours as needed for severe pain. Must last 30 days. 120 tablet 0  . [START ON 09/13/2020] HYDROcodone-acetaminophen (NORCO) 10-325 MG tablet Take 1 tablet by  mouth every 6 (six) hours as needed for severe pain. Must last 30 days. 120 tablet 0  . levothyroxine (SYNTHROID) 125 MCG tablet Take 125 mcg by mouth daily before breakfast.    . levothyroxine (SYNTHROID) 150 MCG tablet Take 1 tablet (150 mcg total) by mouth daily before breakfast. 30 tablet 0  . linaclotide (LINZESS) 145 MCG CAPS capsule Take 1 capsule (145 mcg total) by mouth daily. 90 capsule 3  . lisinopril (ZESTRIL) 20 MG tablet Take 0.5 tablets (10 mg total) by mouth daily. 90 tablet 0  . Magnesium 250 MG TABS Take 2 tablets (500 mg total) by mouth daily for 90 doses. 60 tablet 2  . methocarbamol (ROBAXIN) 750 MG tablet Take 1 tablet (750 mg total) by mouth 2 (two) times daily as needed for muscle spasms. 60 tablet 2  . Multiple Vitamin (MULTIVITAMIN) capsule Take 1 capsule by mouth daily.    . Olopatadine HCl 0.2 % SOLN Apply 1 drop to eye daily.    . Omega-3 Fatty Acids (OMEGA-3 FISH OIL PO) Take 600 mg by mouth.    . pantoprazole (PROTONIX) 40 MG tablet Take 1 tablet (40 mg total) by mouth daily. 90 tablet 3  . potassium chloride (KLOR-CON) 10 MEQ tablet Take 1 tablet (10 mEq total) by mouth 3 (three) times daily. 270 tablet 3  . pregabalin (LYRICA) 100 MG capsule Take 100 mg by mouth 4 (four) times daily.    Marland Kitchen rOPINIRole (REQUIP) 2 MG tablet Take 1 tablet (2 mg total) by mouth  2 (two) times daily. 180 tablet 3  . Vitamin D, Ergocalciferol, (DRISDOL) 1.25 MG (50000 UNIT) CAPS capsule Take 1 capsule (50,000 Units total) by mouth every 7 (seven) days. 4 capsule 0  . warfarin (COUMADIN) 5 MG tablet TAKE 1 AND 1/2 TABLETS BY MOUTH ON TUEDAYS AND THURSDAYS, THEN TAKE 1 TABLET DAILY ON ALL OTHER DAYS 60 tablet 1   No current facility-administered medications for this visit.     Musculoskeletal: Strength & Muscle Tone: within normal limits Gait & Station: normal Patient leans: N/A  Psychiatric Specialty Exam: Review of Systems  Musculoskeletal: Positive for arthralgias and joint  swelling.  All other systems reviewed and are negative.   There were no vitals taken for this visit.There is no height or weight on file to calculate BMI.  General Appearance: Casual and Fairly Groomed  Eye Contact:  Good  Speech:  Clear and Coherent  Volume:  Normal  Mood:  Euthymic  Affect:  Appropriate and Congruent  Thought Process:  Goal Directed  Orientation:  Full (Time, Place, and Person)  Thought Content: WDL   Suicidal Thoughts:  No  Homicidal Thoughts:  No  Memory:  Immediate;   Good Recent;   Good Remote;   Good  Judgement:  Good  Insight:  Fair  Psychomotor Activity:  Decreased  Concentration:  Concentration: Good and Attention Span: Good  Recall:  Good  Fund of Knowledge: Good  Language: Good  Akathisia:  No  Handed:  Right  AIMS (if indicated): not done  Assets: Family support  ADL's:  Intact  Cognition: WNL  Sleep:  Good   Screenings: PHQ2-9     Office Visit from 07/11/2020 in [place] Office Visit from 05/16/2020 in [facility] [place] Office Visit from 04/24/2020 in [place] Office Visit from 03/29/2020 in [place] Office Visit from 01/20/2020 in [place]  PHQ-2 Total Score 0 0 0 6 4  PHQ-9 Total Score -- -- -- 19 13       Assessment and Plan: This patient is a 54 year old female with a history of depression anxiety and ADD.  She seems to be stable at this point.  She will continue Prozac 40 mg daily for depression, Xanax 1 mg twice daily as needed for anxiety and Adderall 30 mg twice daily for ADD.  She will return to see me in 3 months   Jane Spiller, MD 08/21/2020, 11:33 AM

## 2020-08-22 ENCOUNTER — Telehealth (HOSPITAL_COMMUNITY): Payer: Self-pay | Admitting: Psychiatry

## 2020-08-22 DIAGNOSIS — E039 Hypothyroidism, unspecified: Secondary | ICD-10-CM | POA: Diagnosis not present

## 2020-08-22 NOTE — Telephone Encounter (Signed)
Called to schedule f/u, left vm 

## 2020-08-23 ENCOUNTER — Ambulatory Visit (INDEPENDENT_AMBULATORY_CARE_PROVIDER_SITE_OTHER): Payer: Medicaid Other

## 2020-08-23 ENCOUNTER — Other Ambulatory Visit: Payer: Self-pay

## 2020-08-23 DIAGNOSIS — E039 Hypothyroidism, unspecified: Secondary | ICD-10-CM | POA: Diagnosis not present

## 2020-08-23 DIAGNOSIS — Z86718 Personal history of other venous thrombosis and embolism: Secondary | ICD-10-CM

## 2020-08-23 LAB — POCT INR
INR: 2.7 (ref 2.0–3.0)
PT: 32.4

## 2020-08-23 NOTE — Patient Instructions (Signed)
Description    5mg  daily except 2.5mg  Mondays and Fridays.  Recheck 4 weeks

## 2020-08-24 DIAGNOSIS — E039 Hypothyroidism, unspecified: Secondary | ICD-10-CM | POA: Diagnosis not present

## 2020-08-25 DIAGNOSIS — E039 Hypothyroidism, unspecified: Secondary | ICD-10-CM | POA: Diagnosis not present

## 2020-08-26 DIAGNOSIS — E039 Hypothyroidism, unspecified: Secondary | ICD-10-CM | POA: Diagnosis not present

## 2020-08-27 DIAGNOSIS — E039 Hypothyroidism, unspecified: Secondary | ICD-10-CM | POA: Diagnosis not present

## 2020-08-28 DIAGNOSIS — E039 Hypothyroidism, unspecified: Secondary | ICD-10-CM | POA: Diagnosis not present

## 2020-08-29 ENCOUNTER — Encounter (INDEPENDENT_AMBULATORY_CARE_PROVIDER_SITE_OTHER): Payer: Self-pay | Admitting: Family Medicine

## 2020-08-29 ENCOUNTER — Ambulatory Visit (INDEPENDENT_AMBULATORY_CARE_PROVIDER_SITE_OTHER): Payer: Medicaid Other | Admitting: Family Medicine

## 2020-08-29 ENCOUNTER — Telehealth: Payer: Self-pay | Admitting: Orthopedic Surgery

## 2020-08-29 ENCOUNTER — Other Ambulatory Visit: Payer: Self-pay

## 2020-08-29 VITALS — BP 108/61 | HR 60 | Temp 97.8°F | Ht 70.0 in | Wt 258.0 lb

## 2020-08-29 DIAGNOSIS — E559 Vitamin D deficiency, unspecified: Secondary | ICD-10-CM

## 2020-08-29 DIAGNOSIS — E1165 Type 2 diabetes mellitus with hyperglycemia: Secondary | ICD-10-CM

## 2020-08-29 DIAGNOSIS — Z6837 Body mass index (BMI) 37.0-37.9, adult: Secondary | ICD-10-CM | POA: Diagnosis not present

## 2020-08-29 DIAGNOSIS — E039 Hypothyroidism, unspecified: Secondary | ICD-10-CM | POA: Diagnosis not present

## 2020-08-29 MED ORDER — VITAMIN D (ERGOCALCIFEROL) 1.25 MG (50000 UNIT) PO CAPS: 50000.0000 [IU] | ORAL_CAPSULE | ORAL | 0 refills | Status: DC

## 2020-08-29 NOTE — Telephone Encounter (Signed)
Jane [name],   Patient called to see if they are allowing knee surgery now at [facility].    Please call her at [phone]   Thanks!

## 2020-08-29 NOTE — Telephone Encounter (Signed)
I called and left patient a voicemail to call the office I have schedule her for 09/13/20 @ 10:10.

## 2020-08-29 NOTE — Telephone Encounter (Signed)
She will not require overnight stay, she does require clearance from her doctor to come off coumadin for the surgery  I tried explaining this to her, and clearly she does not understand  She seems confused about what she needs maybe Dr Aline Brochure can help   Please make her appointment do not work in next available is fine.

## 2020-08-30 DIAGNOSIS — E039 Hypothyroidism, unspecified: Secondary | ICD-10-CM | POA: Diagnosis not present

## 2020-08-30 NOTE — Progress Notes (Signed)
Chief Complaint:   OBESITY Jane [name] is here to discuss her progress with her obesity treatment plan along with follow-up of her obesity related diagnoses. Jane [name] is on the Category 4 Plan and states she is following her eating plan approximately 60% of the time. Jane [name] states she is doing 0 minutes 0 times per week.  Today's visit was #: 10 Starting weight: 283 lbs Starting date: 03/29/2020 Today's weight: 258 lbs Today's date: 08/29/2020 Total lbs lost to date: 25 Total lbs lost since last in-office visit: 0  Interim History: Jane [name] is struggling with lack of appetite and fatigue. [facility]. She is not able to get all the food in. Not really interested in any food persay. Looking for possible change in plan that is still structured.  Subjective:   1. Vitamin D deficiency Jane [name] denies nausea, vomiting, or muscle weakness, but notes fatigue. Last Vit D level was 37.2.  2. Type 2 diabetes mellitus with hyperglycemia, without long-term current use of insulin (Jane [name]) Jane [name]'s most recent [name] was 6.2. She denies carbohydrate cravings. She is not on glucose altering medicine.  Assessment/Plan:   1. Vitamin D deficiency Low Vitamin D level contributes to fatigue and are associated with obesity, breast, and colon cancer. We will refill prescription Vitamin D for 1 month. Jane [name] will follow-up for routine testing of Vitamin D, at least 2-3 times per year to avoid over-replacement.  - Vitamin D, Ergocalciferol, (DRISDOL) 1.25 MG (50000 UNIT) CAPS capsule; Take 1 capsule (50,000 Units total) by mouth every 7 (seven) days.  Dispense: 4 capsule; Refill: 0  2. Type 2 diabetes mellitus with hyperglycemia, without long-term current use of insulin (Jane [name]) Good blood sugar control is important to decrease the likelihood of diabetic complications such as nephropathy, neuropathy, limb loss, blindness, coronary artery disease, and death. Intensive lifestyle modification including  diet, exercise and weight loss are the first line of treatment for diabetes. We will repeat labs in December, and Jane [name] will follow up as directed.  3. Class 2 severe obesity with serious comorbidity and body mass index (BMI) of 37.0 to 37.9 in adult, unspecified obesity type (Jane [name]) Jane [name] is currently in the action stage of change. As such, her goal is to continue with weight loss efforts. She has agreed to the [employer] + 500 calories.   Exercise goals: No exercise has been prescribed at this time.  Behavioral modification strategies: increasing lean protein intake, meal planning and cooking strategies, keeping healthy foods in the home and planning for success.  Jane [name] has agreed to follow-up with our clinic in 2 to 3 weeks. She was informed of the importance of frequent follow-up visits to maximize her success with intensive lifestyle modifications for her multiple health conditions.   Objective:   Blood pressure 108/61, pulse 60, temperature 97.8 F (36.6 C), temperature source Oral, height 5\' 10"  (1.778 m), weight 258 lb (117 kg), SpO2 97 %. Body mass index is 37.02 kg/m.  General: Cooperative, alert, well developed, in no acute distress. HEENT: Conjunctivae and lids unremarkable. Cardiovascular: Regular rhythm.  Lungs: Normal work of breathing. Neurologic: No focal deficits.   Lab Results  Component Value Date   CREATININE 1.32 (H) 07/18/2020   BUN 35 (H) 07/18/2020   NA 140 07/18/2020   K 4.6 07/18/2020   CL 104 07/18/2020   CO2 25 07/18/2020   Lab Results  Component Value Date   ALT 19 07/18/2020   AST 18 07/18/2020   ALKPHOS  102 07/18/2020   BILITOT 0.9 07/18/2020   Lab Results  Component Value Date   HGBA1C 6.2 (H) 03/29/2020   HGBA1C 6.3 (H) 10/25/2019   HGBA1C 6.3 (H) 03/26/2019   HGBA1C 7.5 11/18/2018   Lab Results  Component Value Date   INSULIN 21.5 03/29/2020   Lab Results  Component Value Date   TSH 0.107 (L) 05/18/2020   Lab Results    Component Value Date   CHOL 203 (H) 03/29/2020   HDL 37 (L) 03/29/2020   LDLCALC 114 (H) 03/29/2020   TRIG 296 (H) 03/29/2020   CHOLHDL 5.0 (H) 10/25/2019   Lab Results  Component Value Date   WBC 7.3 07/18/2020   HGB 13.2 07/18/2020   HCT 40.5 07/18/2020   MCV 93.1 07/18/2020   PLT 135 (L) 07/18/2020   Lab Results  Component Value Date   IRON 81 07/18/2020   TIBC 279 07/18/2020   FERRITIN 266 07/18/2020   Attestation Statements:   Reviewed by clinician on day of visit: allergies, medications, problem list, medical history, surgical history, family history, social history, and previous encounter notes.  Time spent on visit including pre-visit chart review and post-visit care and charting was 17 minutes.    I, Trixie Dredge, am acting as transcriptionist for Coralie Common, MD.  I have reviewed the above documentation for accuracy and completeness, and I agree with the above. - Jinny Blossom, MD

## 2020-08-31 DIAGNOSIS — G609 Hereditary and idiopathic neuropathy, unspecified: Secondary | ICD-10-CM | POA: Diagnosis not present

## 2020-08-31 DIAGNOSIS — E039 Hypothyroidism, unspecified: Secondary | ICD-10-CM | POA: Diagnosis not present

## 2020-08-31 DIAGNOSIS — M7662 Achilles tendinitis, left leg: Secondary | ICD-10-CM | POA: Diagnosis not present

## 2020-09-01 DIAGNOSIS — E039 Hypothyroidism, unspecified: Secondary | ICD-10-CM | POA: Diagnosis not present

## 2020-09-02 DIAGNOSIS — E039 Hypothyroidism, unspecified: Secondary | ICD-10-CM | POA: Diagnosis not present

## 2020-09-03 DIAGNOSIS — E039 Hypothyroidism, unspecified: Secondary | ICD-10-CM | POA: Diagnosis not present

## 2020-09-04 DIAGNOSIS — F419 Anxiety disorder, unspecified: Secondary | ICD-10-CM | POA: Diagnosis not present

## 2020-09-04 DIAGNOSIS — E785 Hyperlipidemia, unspecified: Secondary | ICD-10-CM | POA: Diagnosis not present

## 2020-09-04 DIAGNOSIS — I10 Essential (primary) hypertension: Secondary | ICD-10-CM | POA: Diagnosis not present

## 2020-09-04 DIAGNOSIS — E039 Hypothyroidism, unspecified: Secondary | ICD-10-CM | POA: Diagnosis not present

## 2020-09-04 DIAGNOSIS — R109 Unspecified abdominal pain: Secondary | ICD-10-CM | POA: Diagnosis not present

## 2020-09-04 DIAGNOSIS — R1084 Generalized abdominal pain: Secondary | ICD-10-CM | POA: Diagnosis not present

## 2020-09-04 DIAGNOSIS — Z888 Allergy status to other drugs, medicaments and biological substances status: Secondary | ICD-10-CM | POA: Diagnosis not present

## 2020-09-04 DIAGNOSIS — I700 Atherosclerosis of aorta: Secondary | ICD-10-CM | POA: Diagnosis not present

## 2020-09-04 DIAGNOSIS — Z79899 Other long term (current) drug therapy: Secondary | ICD-10-CM | POA: Diagnosis not present

## 2020-09-04 DIAGNOSIS — M47816 Spondylosis without myelopathy or radiculopathy, lumbar region: Secondary | ICD-10-CM | POA: Diagnosis not present

## 2020-09-04 DIAGNOSIS — Z7901 Long term (current) use of anticoagulants: Secondary | ICD-10-CM | POA: Diagnosis not present

## 2020-09-04 DIAGNOSIS — K429 Umbilical hernia without obstruction or gangrene: Secondary | ICD-10-CM | POA: Diagnosis not present

## 2020-09-04 DIAGNOSIS — L929 Granulomatous disorder of the skin and subcutaneous tissue, unspecified: Secondary | ICD-10-CM | POA: Diagnosis not present

## 2020-09-05 DIAGNOSIS — E039 Hypothyroidism, unspecified: Secondary | ICD-10-CM | POA: Diagnosis not present

## 2020-09-06 ENCOUNTER — Telehealth (HOSPITAL_COMMUNITY): Payer: Self-pay | Admitting: Psychiatry

## 2020-09-06 DIAGNOSIS — E039 Hypothyroidism, unspecified: Secondary | ICD-10-CM | POA: Diagnosis not present

## 2020-09-06 NOTE — Telephone Encounter (Signed)
Called to schedule f/u, left vm 

## 2020-09-07 DIAGNOSIS — E039 Hypothyroidism, unspecified: Secondary | ICD-10-CM | POA: Diagnosis not present

## 2020-09-08 DIAGNOSIS — H40033 Anatomical narrow angle, bilateral: Secondary | ICD-10-CM | POA: Diagnosis not present

## 2020-09-08 DIAGNOSIS — H04123 Dry eye syndrome of bilateral lacrimal glands: Secondary | ICD-10-CM | POA: Diagnosis not present

## 2020-09-08 DIAGNOSIS — E039 Hypothyroidism, unspecified: Secondary | ICD-10-CM | POA: Diagnosis not present

## 2020-09-09 DIAGNOSIS — E039 Hypothyroidism, unspecified: Secondary | ICD-10-CM | POA: Diagnosis not present

## 2020-09-10 DIAGNOSIS — E039 Hypothyroidism, unspecified: Secondary | ICD-10-CM | POA: Diagnosis not present

## 2020-09-11 DIAGNOSIS — E039 Hypothyroidism, unspecified: Secondary | ICD-10-CM | POA: Diagnosis not present

## 2020-09-12 ENCOUNTER — Other Ambulatory Visit: Payer: Self-pay | Admitting: Family Medicine

## 2020-09-12 DIAGNOSIS — I10 Essential (primary) hypertension: Secondary | ICD-10-CM

## 2020-09-12 DIAGNOSIS — E039 Hypothyroidism, unspecified: Secondary | ICD-10-CM | POA: Diagnosis not present

## 2020-09-13 ENCOUNTER — Encounter: Payer: Self-pay | Admitting: Orthopedic Surgery

## 2020-09-13 ENCOUNTER — Telehealth: Payer: Self-pay

## 2020-09-13 ENCOUNTER — Other Ambulatory Visit: Payer: Self-pay

## 2020-09-13 ENCOUNTER — Ambulatory Visit (INDEPENDENT_AMBULATORY_CARE_PROVIDER_SITE_OTHER): Payer: Medicaid Other | Admitting: Family Medicine

## 2020-09-13 ENCOUNTER — Ambulatory Visit: Payer: Medicaid Other | Admitting: Orthopedic Surgery

## 2020-09-13 VITALS — BP 130/68 | HR 61 | Ht 70.0 in | Wt 258.0 lb

## 2020-09-13 DIAGNOSIS — Z6841 Body Mass Index (BMI) 40.0 and over, adult: Secondary | ICD-10-CM

## 2020-09-13 DIAGNOSIS — E039 Hypothyroidism, unspecified: Secondary | ICD-10-CM | POA: Diagnosis not present

## 2020-09-13 DIAGNOSIS — M1712 Unilateral primary osteoarthritis, left knee: Secondary | ICD-10-CM

## 2020-09-13 DIAGNOSIS — M23322 Other meniscus derangements, posterior horn of medial meniscus, left knee: Secondary | ICD-10-CM

## 2020-09-13 NOTE — Progress Notes (Addendum)
Chief Complaint  Patient presents with  . Knee Pain    left / wants to schedule surgery. I have explained to her x3 we need clearance letter from PCP Dr Brita Romp     Encounter Diagnoses  Name Primary?  . Unilateral primary osteoarthritis, left knee Yes  . Body mass index 40.0-44.9, adult ([place])   . Morbid obesity ([place])   . Other meniscus derangements, posterior horn of medial meniscus, left knee     54 history of DVT in 2020 history of revascularization lower extremities currently on warfarin continues to have severe left knee pain wants to have surgery understands that she is high risk.  We have gone over the several times went over it again today she is at risk for clotting her grass or having a DVT for having left leg and knee pain secondary to the multiple lumbar spine surgeries as well as the chronic pain opioid therapy   Past Medical History:  Diagnosis Date  . ADD (attention deficit disorder)   . Anxiety   . Back pain   . Bilateral swelling of feet   . Bipolar 1 disorder ([place])   . Bipolar 1 disorder ([place])   . Bipolar disorder ([place])   . Chewing difficulty   . Chronic fatigue syndrome   . Chronic kidney disease    Stage 3 kidney disease;dx by Dr. Sinda Du.   . Constipation   . Depression   . Diabetes mellitus without complication ([place])   . Dyspnea    with exertion  . GERD (gastroesophageal reflux disease)   . Headache    migraines  . High cholesterol   . History of blood clots   . History of DVT (deep vein thrombosis)    left leg  . Hypertension    states under control with meds., has been on med. x 2 years  . Hypothyroidism   . Joint pain   . Neuropathy   . Obsessive-compulsive disorder   . Peripheral vascular disease ([place])   . Prediabetes   . Respiratory failure requiring intubation ([place])   . Restless leg syndrome   . Shortness of breath   . Sleep apnea   . Swallowing difficulty   . Thrombocytopenia ([place]) 09/15/2019  . Trigger thumb of left hand  01/2018  . Trigger thumb of right hand     The patient meets the AMA guidelines for Morbid (severe) obesity with a BMI > 40.0 and I have recommended weight loss.  The doctor will get a's a written clearance and then she says she has to have 2 weeks of Lovenox shots after coming off warfarin before she can have surgery so we will continue with that treatment plan  If we do get to surgery then  Arthroscopy left knee partial medial meniscectomy versus repair

## 2020-09-13 NOTE — Patient Instructions (Addendum)
Surgery will be scheduled   You are considered high risk for surgery   High risk for bleeding, clotting, and pain in the knee after surgery    Knee Arthroscopy Knee arthroscopy is a surgery to examine the inside of the knee joint and repair any damage to cartilage, surfaces, and other soft tissues around the joint. You may have this surgery if non-surgical treatment has not relieved your symptoms. Knee arthroscopy may be used to:  Repair a torn ligament or other torn tissues. Ligaments are tissues that connect bones to each other.  Remove bone fragments.  Remove a fluid-filled sac (cyst).  Treat kneecap (patella)problems.  Treat an advanced infection in the knee (septicknee). Arthroscopic surgery is done using a thin tube that has a light and camera on the end of it (arthroscope). The arthroscope is placed through a small incision, and the camera sends images to a screen in the operating room. The images are used to help perform the surgery. Tell a health care provider about:  Any allergies you have.  All medicines you are taking, including vitamins, herbs, eye drops, creams, and over-the-counter medicines.  Any problems you or family members have had with anesthetic medicines.  Any blood disorders you have.  Any surgeries you have had.  Any medical conditions you have.  Whether you are pregnant or may be pregnant. What are the risks? Generally, this is a safe procedure. However, problems may occur, including:  Infection.  Bleeding.  Allergic reactions to medicines.  Damage to blood vessels, nerves, or tissues in the knee.  A blood clot that forms in the leg and travels to the lung (pulmonary embolism).  Failure of the surgery to relieve symptoms.  Knee stiffness. What happens before the procedure? Staying hydrated Follow instructions from your health care provider about hydration, which may include:  Up to 2 hours before the procedure - you may continue to  drink clear liquids, such as water, clear fruit juice, black coffee, and plain tea. Eating and drinking restrictions Follow instructions from your health care provider about eating and drinking, which may include:  8 hours before the procedure - stop eating heavy meals or foods such as meat, fried foods, or fatty foods.  6 hours before the procedure - stop eating light meals or foods, such as toast or cereal.  6 hours before the procedure - stop drinking milk or drinks that contain milk.  2 hours before the procedure - stop drinking clear liquids. Medicines Ask your health care provider about:  Changing or stopping your regular medicines. This is especially important if you are taking diabetes medicines or blood thinners.  Taking medicines such as aspirin and ibuprofen. These medicines can thin your blood. Do not take these medicines unless your health care provider tells you to take them.  Taking over-the-counter medicines, vitamins, herbs, and supplements. Testing  Your knee may be examined. Your health care provider may move your knee or ask you to move it in specific ways to see how much motion you have.  You may have blood tests.  You may have imaging tests, such as an X-ray, MRI, or CT scan.  You may have an electrocardiogram. This test records the electrical activity in the heart. General instructions  Do not drink alcohol unless your health care provider says that you can.  Do not use any products that contain nicotine or tobacco, such as cigarettes and e-cigarettes, for one month or more before your surgery. If you need help quitting,  ask your health care provider.  Plan to have someone take you home from the hospital or clinic.  Plan to have a responsible adult care for you for at least 24 hours after you leave the hospital or clinic. This is important. What happens during the procedure?   To lower your risk of infection: ? Your health care team will wash or  sanitize their hands. ? Hair may be removed from the surgical area. ? Your skin will be washed with soap.  An IV will be inserted into one of your veins.  You will be given one or more of the following: ? A medicine to help you relax (sedative). ? A medicine to numb the knee area (local anesthetic). ? A medicine to make you fall asleep (general anesthetic). ? A medicine that is injected into an area of your body to numb everything below the injection site (regional anesthetic). This may be injected into your groin or thigh.  A cuff may be placed around your upper leg to slow blood flow to your lower leg during the procedure.  Several small incisions will be made around your knee.  Your knee joint will be rinsed (flushed) and filled with a germ-free solution (sterile saline). This expands the area to allow your surgeon to see the joint more clearly.  An arthroscope will be passed through one of your incisions, into your knee joint.  Other surgical instruments will be passed through the other incisions. Then, your surgeon will examine and repair your knee as needed.  The sterile saline will be drained from your knee, and the cuff will be removed from your upper leg.  Your incisions will be closed with adhesive strips or stitches (sutures) and covered with a bandage (dressing). The procedure may vary among health care providers and hospitals. What happens after the procedure?  Your blood pressure, heart rate, breathing rate, and blood oxygen level will be monitored until the medicines you were given have worn off.  You will be given pain medicine as needed.  You may be given medicine to lower your risk of blood clots.  You may have to wear compression stockings. These stockings help to prevent blood clots and reduce swelling in your legs.  Your health care provider will give you instructions about how much body weight you can safely support on your leg (weight-bearing restrictions).  You may be given crutches or other devices to help you move around (assistive devices).  You may be shown how to do physical therapy exercises to help you recover.  Do not drive until your health care provider approves. Summary  Knee arthroscopy is a surgery to examine or repair the inside of the knee joint.  Before the procedure, follow instructions from your health care provider about eating and drinking.  Plan to have someone take you home from the hospital or clinic. This information is not intended to replace advice given to you by your health care provider. Make sure you discuss any questions you have with your health care provider. Document Revised: 10/03/2017 Document Reviewed: 07/24/2017 Elsevier Patient Education  2020 [employer].

## 2020-09-13 NOTE — Telephone Encounter (Signed)
Patient scheduled for 10/24/2020 for 6 mth fu. I scheduled her for 11/09/2020 for pre op.    Copied from [place] [id number]. Topic: General - Inquiry >> Sep 13, 2020 10:52 AM Lennox Solders wrote: Reason for CRM: pt is calling dr [facility] [place]  phone number [phone].Pt has not be schedule for surgery yet. Pt also will need a rx for coumadin injection  [facility] [place]. They will fax another medical clearance form over

## 2020-09-14 ENCOUNTER — Ambulatory Visit (INDEPENDENT_AMBULATORY_CARE_PROVIDER_SITE_OTHER): Payer: Medicaid Other | Admitting: Family Medicine

## 2020-09-14 DIAGNOSIS — E039 Hypothyroidism, unspecified: Secondary | ICD-10-CM | POA: Diagnosis not present

## 2020-09-14 NOTE — Telephone Encounter (Signed)
OK. Will wait to Rx Lovenox (aka Coumadin shot that she mentioned) until pre-op as she does not need to take it this far from surgery.

## 2020-09-15 ENCOUNTER — Telehealth: Payer: Self-pay | Admitting: Orthopedic Surgery

## 2020-09-15 DIAGNOSIS — E039 Hypothyroidism, unspecified: Secondary | ICD-10-CM | POA: Diagnosis not present

## 2020-09-15 NOTE — Telephone Encounter (Signed)
Patient returned call - I relayed the information per Amy's response, and per FYI to Dr Aline Brochure.  Patient states she will be able to see her primary care until 11/09/20 regarding clearance. States she is calling [facility] next, in order to take care of the wheeled walker as noted.

## 2020-09-15 NOTE — Telephone Encounter (Signed)
FYI to Dr. Aline Brochure  Patient unable to get walker, since she just got one last year. When I asked her if she had walker she said no.   Her knee scope has not been scheduled yet. I will make sure she has one before surgery.   Can not schedule surgery until PCP clears, patient aware.

## 2020-09-15 NOTE — Telephone Encounter (Signed)
Call received from [place] at [facility], ph# [phone], regarding the wheeled walker prescribed by Dr Aline Brochure - relays that patient indicated to them that she had never received a wheeled walker.  Tammy said that at the time, their system was having difficulty with insurance verification.  Said when their system came up, insurance declined due to patient having had a wheeled walker in May of 2020 prescribed by another provider; therefore, patient will need to either return the walker immediately, or be responsible for the self-pay cost, $130.00  Tammy relays they have been trying to reach patient since yesterday.  I called patient, left a message, and also was able to reach her daughter, who voiced understanding. Michela Pitcher will make sure they call back to [place].

## 2020-09-15 NOTE — Telephone Encounter (Signed)
Done. Patient seen as scheduled 09/13/20.

## 2020-09-16 DIAGNOSIS — E039 Hypothyroidism, unspecified: Secondary | ICD-10-CM | POA: Diagnosis not present

## 2020-09-17 DIAGNOSIS — E039 Hypothyroidism, unspecified: Secondary | ICD-10-CM | POA: Diagnosis not present

## 2020-09-18 ENCOUNTER — Encounter (INDEPENDENT_AMBULATORY_CARE_PROVIDER_SITE_OTHER): Payer: Self-pay | Admitting: Family Medicine

## 2020-09-18 ENCOUNTER — Other Ambulatory Visit: Payer: Self-pay | Admitting: Family Medicine

## 2020-09-18 ENCOUNTER — Other Ambulatory Visit: Payer: Self-pay

## 2020-09-18 ENCOUNTER — Ambulatory Visit (INDEPENDENT_AMBULATORY_CARE_PROVIDER_SITE_OTHER): Payer: Medicaid Other | Admitting: Family Medicine

## 2020-09-18 VITALS — BP 118/60 | HR 54 | Temp 98.2°F | Ht 70.0 in | Wt 256.0 lb

## 2020-09-18 DIAGNOSIS — E1169 Type 2 diabetes mellitus with other specified complication: Secondary | ICD-10-CM

## 2020-09-18 DIAGNOSIS — I10 Essential (primary) hypertension: Secondary | ICD-10-CM | POA: Diagnosis not present

## 2020-09-18 DIAGNOSIS — E785 Hyperlipidemia, unspecified: Secondary | ICD-10-CM

## 2020-09-18 DIAGNOSIS — E559 Vitamin D deficiency, unspecified: Secondary | ICD-10-CM | POA: Diagnosis not present

## 2020-09-18 DIAGNOSIS — E039 Hypothyroidism, unspecified: Secondary | ICD-10-CM | POA: Diagnosis not present

## 2020-09-18 DIAGNOSIS — Z6836 Body mass index (BMI) 36.0-36.9, adult: Secondary | ICD-10-CM

## 2020-09-18 MED ORDER — LISINOPRIL 20 MG PO TABS: 10.0000 mg | ORAL_TABLET | Freq: Every day | ORAL | 0 refills | Status: DC

## 2020-09-18 NOTE — Telephone Encounter (Signed)
Medication Refill - Medication: lisinopril (ZESTRIL) 20 MG tablet [Pharmacy Med Name: lisinopril 20 mg tablet] (Patients request was denied and was labeled as "provider no longer at practice" in error. Patient needs prescription sent to pharmacy as soon as possible.)   Has the patient contacted their pharmacy? yes (Agent: If no, request that the patient contact the pharmacy for the refill.) (Agent: If yes, when and what did the pharmacy advise?)Contact PCP  Preferred Pharmacy (with phone number or street name):  [facility], [place] Phone:  [phone]  Fax:  [phone]       Agent: Please be advised that RX refills may take up to 3 business days. We ask that you follow-up with your pharmacy.

## 2020-09-18 NOTE — Telephone Encounter (Signed)
Requested medication (s) are due for refill today -yes  Requested medication (s) are on the active medication list -yes  Future visit scheduled -yes  Last refill: August  Notes to clinic: Rx filled by outside provider- request RF by PCP  Requested Prescriptions  Pending Prescriptions Disp Refills   lisinopril (ZESTRIL) 20 MG tablet 90 tablet 0    Sig: Take 0.5 tablets (10 mg total) by mouth daily.      Cardiovascular:  ACE Inhibitors Failed - 09/18/2020 10:56 AM      Failed - Cr in normal range and within 180 days    Creatinine, Ser  Date Value Ref Range Status  07/18/2020 1.32 (H) 0.44 - 1.00 mg/dL Final          Passed - K in normal range and within 180 days    Potassium  Date Value Ref Range Status  07/18/2020 4.6 3.5 - 5.1 mmol/L Final          Passed - Patient is not pregnant      Passed - Last BP in normal range    BP Readings from Last 1 Encounters:  09/13/20 130/68          Passed - Valid encounter within last 6 months    Recent Outpatient Visits           4 months ago Essential hypertension   [facility] [place], Dionne Bucy, MD   6 months ago Displacement of lumbar intervertebral disc without myelopathy   [facility] [place], [place], PA-C   6 months ago Type 2 diabetes mellitus with stage 3a chronic kidney disease, without long-term current use of insulin ([place])   [facility], Dionne Bucy, MD   7 months ago Acquired hypothyroidism   [facility], Dionne Bucy, MD   8 months ago Encounter for annual physical exam   [facility], Dionne Bucy, MD       Future Appointments             In 1 month Bacigalupo, Dionne Bucy, MD [facility], [place]   In 1 month Derek Jack, MD [facility]   In 1 month Bacigalupo, Dionne Bucy, MD [facility], [facility]                Requested Prescriptions  Pending  Prescriptions Disp Refills   lisinopril (ZESTRIL) 20 MG tablet 90 tablet 0    Sig: Take 0.5 tablets (10 mg total) by mouth daily.      Cardiovascular:  ACE Inhibitors Failed - 09/18/2020 10:56 AM      Failed - Cr in normal range and within 180 days    Creatinine, Ser  Date Value Ref Range Status  07/18/2020 1.32 (H) 0.44 - 1.00 mg/dL Final          Passed - K in normal range and within 180 days    Potassium  Date Value Ref Range Status  07/18/2020 4.6 3.5 - 5.1 mmol/L Final          Passed - Patient is not pregnant      Passed - Last BP in normal range    BP Readings from Last 1 Encounters:  09/13/20 130/68          Passed - Valid encounter within last 6 months    Recent Outpatient Visits           4 months ago Essential hypertension   [facility] [place]igal[place], Dionne Bucy, MD   6 months ago Displacement of lumbar intervertebral disc without myelopathy   [facility] [place], [place], [place]   6 months ago Type 2 diabetes mellitus with stage 3a chronic kidney disease, without long-term current use of insulin [facility])   [facility] [place], Dionne Bucy, MD   7 months ago Acquired hypothyroidism   [facility], Dionne Bucy, MD   8 months ago Encounter for annual physical exam   [facility], Dionne Bucy, MD       Future Appointments             In 1 month Bacigalupo, Dionne Bucy, MD [facility], [place]   In 1 month Derek Jack, MD [facility]   In 1 month Bacigalupo, Dionne Bucy, MD [facility], [place]

## 2020-09-19 DIAGNOSIS — E039 Hypothyroidism, unspecified: Secondary | ICD-10-CM | POA: Diagnosis not present

## 2020-09-20 ENCOUNTER — Other Ambulatory Visit: Payer: Self-pay

## 2020-09-20 ENCOUNTER — Ambulatory Visit: Payer: Self-pay

## 2020-09-20 ENCOUNTER — Ambulatory Visit (INDEPENDENT_AMBULATORY_CARE_PROVIDER_SITE_OTHER): Payer: Medicaid Other

## 2020-09-20 DIAGNOSIS — E039 Hypothyroidism, unspecified: Secondary | ICD-10-CM | POA: Diagnosis not present

## 2020-09-20 DIAGNOSIS — Z86718 Personal history of other venous thrombosis and embolism: Secondary | ICD-10-CM

## 2020-09-20 LAB — POCT INR
INR: 2.1 (ref 2.0–3.0)
PT: 24.8

## 2020-09-20 NOTE — Patient Instructions (Signed)
Description    5mg  daily except 2.5mg  Mondays and Fridays.  Recheck 4 weeks

## 2020-09-21 DIAGNOSIS — E039 Hypothyroidism, unspecified: Secondary | ICD-10-CM | POA: Diagnosis not present

## 2020-09-22 DIAGNOSIS — E039 Hypothyroidism, unspecified: Secondary | ICD-10-CM | POA: Diagnosis not present

## 2020-09-23 DIAGNOSIS — E039 Hypothyroidism, unspecified: Secondary | ICD-10-CM | POA: Diagnosis not present

## 2020-09-24 DIAGNOSIS — E039 Hypothyroidism, unspecified: Secondary | ICD-10-CM | POA: Diagnosis not present

## 2020-09-25 DIAGNOSIS — E039 Hypothyroidism, unspecified: Secondary | ICD-10-CM | POA: Diagnosis not present

## 2020-09-25 NOTE — Progress Notes (Signed)
Chief Complaint:   OBESITY Jane [name] is here to discuss her progress with her obesity treatment plan along with follow-up of her obesity related diagnoses. Jane [name] is on the [employer] + 500 calories and states she is following her eating plan approximately 50% of the time. Jane [name] states she is walking 5,000 steps 5 times per week.  Today's visit was #: 11 Starting weight: 283 lbs Starting date: 03/29/2020 Today's weight: 256 lbs Today's date: 09/18/2020 Total lbs lost to date: 27 Total lbs lost since last in-office visit: 2  Interim History: Jane [name] voices that she has been tempted with Halloween candy. She eats when she is stressed and her youngest daughter stresses her. She is going to go to [employer] for Thanksgiving. She is getting knee surgery in early 2022. She has no plans for the rest of the holidays.  Subjective:   1. Hyperlipidemia associated with type 2 diabetes mellitus ([place]) Jane [name] is on Lipitor, and her last LDL was 114. She denies myalgias or transaminitis.  2. Vitamin D deficiency Jane [name] denies nausea, vomiting, or muscle weakness. She is on prescription Vit D.  3. Essential hypertension Jane [name]'s blood pressure is well controlled. She denies chest pain, chest pressure, or headache.  Assessment/Plan:   1. Hyperlipidemia associated with type 2 diabetes mellitus ([place]) Cardiovascular risk and specific lipid/LDL goals reviewed.  We discussed several lifestyle modifications today. Jane [name] will continue Lipitor, and will continue to work on diet, exercise and weight loss efforts. We will repeat labs at her next appointment. Orders and follow up as documented in patient record.   Counseling Intensive lifestyle modifications are the first line treatment for this issue. . Dietary changes: Increase soluble fiber. Decrease simple carbohydrates. . Exercise changes: Moderate to vigorous-intensity aerobic activity 150 minutes per week if tolerated. . Lipid-lowering  medications: see documented in medical record.  2. Vitamin D deficiency Low Vitamin D level contributes to fatigue and are associated with obesity, breast, and colon cancer. Jane [name] agreed to continue taking prescription Vitamin D 50,000 IU every week, and we will repeat labs in January 2022. She will follow-up for routine testing of Vitamin D, at least 2-3 times per year to avoid over-replacement.  3. Essential hypertension Jane [name] agreed to discontinue lisinopril, and will continue working on healthy weight loss and exercise to improve blood pressure control. We will watch for signs of hypotension as she continues her lifestyle modifications. We will follow up on her blood pressure at her next appointment.  4. Class 2 severe obesity with serious comorbidity and body mass index (BMI) of 36.0 to 36.9 in adult, unspecified obesity type ([facility]) Jane [name] is currently in the action stage of change. As such, her goal is to continue with weight loss efforts. She has agreed to the [employer] + 500 calories.   Exercise goals: No exercise has been prescribed at this time.  Behavioral modification strategies: increasing lean protein intake, meal planning and cooking strategies, keeping healthy foods in the home and planning for success.  Jane [name] has agreed to follow-up with our clinic in 3 weeks. She was informed of the importance of frequent follow-up visits to maximize her success with intensive lifestyle modifications for her multiple health conditions.   Objective:   Blood pressure 118/60, pulse (!) 54, temperature 98.2 F (36.8 C), temperature source Oral, height 5\' 10"  (1.778 m), weight 256 lb (116.1 kg), SpO2 99 %. Body mass index is 36.73 kg/m.  General: Cooperative, alert, well developed, in no acute distress. HEENT: Conjunctivae  and lids unremarkable. Cardiovascular: Regular rhythm.  Lungs: Normal work of breathing. Neurologic: No focal deficits.   Lab Results  Component Value Date    CREATININE 1.32 (H) 07/18/2020   BUN 35 (H) 07/18/2020   NA 140 07/18/2020   K 4.6 07/18/2020   CL 104 07/18/2020   CO2 25 07/18/2020   Lab Results  Component Value Date   ALT 19 07/18/2020   AST 18 07/18/2020   ALKPHOS 102 07/18/2020   BILITOT 0.9 07/18/2020   Lab Results  Component Value Date   HGBA1C 6.2 (H) 03/29/2020   HGBA1C 6.3 (H) 10/25/2019   HGBA1C 6.3 (H) 03/26/2019   HGBA1C 7.5 11/18/2018   Lab Results  Component Value Date   INSULIN 21.5 03/29/2020   Lab Results  Component Value Date   TSH 0.107 (L) 05/18/2020   Lab Results  Component Value Date   CHOL 203 (H) 03/29/2020   HDL 37 (L) 03/29/2020   LDLCALC 114 (H) 03/29/2020   TRIG 296 (H) 03/29/2020   CHOLHDL 5.0 (H) 10/25/2019   Lab Results  Component Value Date   WBC 7.3 07/18/2020   HGB 13.2 07/18/2020   HCT 40.5 07/18/2020   MCV 93.1 07/18/2020   PLT 135 (L) 07/18/2020   Lab Results  Component Value Date   IRON 81 07/18/2020   TIBC 279 07/18/2020   FERRITIN 266 07/18/2020   Attestation Statements:   Reviewed by clinician on day of visit: allergies, medications, problem list, medical history, surgical history, family history, social history, and previous encounter notes.  Time spent on visit including pre-visit chart review and post-visit care and charting was 25 minutes.    I, Trixie Dredge, am acting as transcriptionist for Coralie Common, MD.  I have reviewed the above documentation for accuracy and completeness, and I agree with the above. - Jinny Blossom, MD

## 2020-09-26 DIAGNOSIS — E039 Hypothyroidism, unspecified: Secondary | ICD-10-CM | POA: Diagnosis not present

## 2020-09-27 DIAGNOSIS — E039 Hypothyroidism, unspecified: Secondary | ICD-10-CM | POA: Diagnosis not present

## 2020-09-29 DIAGNOSIS — E039 Hypothyroidism, unspecified: Secondary | ICD-10-CM | POA: Diagnosis not present

## 2020-09-30 DIAGNOSIS — E039 Hypothyroidism, unspecified: Secondary | ICD-10-CM | POA: Diagnosis not present

## 2020-10-01 DIAGNOSIS — E039 Hypothyroidism, unspecified: Secondary | ICD-10-CM | POA: Diagnosis not present

## 2020-10-02 DIAGNOSIS — E039 Hypothyroidism, unspecified: Secondary | ICD-10-CM | POA: Diagnosis not present

## 2020-10-03 DIAGNOSIS — E039 Hypothyroidism, unspecified: Secondary | ICD-10-CM | POA: Diagnosis not present

## 2020-10-04 DIAGNOSIS — E039 Hypothyroidism, unspecified: Secondary | ICD-10-CM | POA: Diagnosis not present

## 2020-10-05 ENCOUNTER — Encounter: Payer: Self-pay | Admitting: Student in an Organized Health Care Education/Training Program

## 2020-10-05 ENCOUNTER — Ambulatory Visit
Payer: Medicaid Other | Attending: Student in an Organized Health Care Education/Training Program | Admitting: Student in an Organized Health Care Education/Training Program

## 2020-10-05 ENCOUNTER — Other Ambulatory Visit: Payer: Self-pay

## 2020-10-05 VITALS — BP 151/96 | HR 69 | Temp 97.2°F | Resp 16 | Ht 70.0 in | Wt 256.0 lb

## 2020-10-05 DIAGNOSIS — M961 Postlaminectomy syndrome, not elsewhere classified: Secondary | ICD-10-CM

## 2020-10-05 DIAGNOSIS — E1142 Type 2 diabetes mellitus with diabetic polyneuropathy: Secondary | ICD-10-CM | POA: Diagnosis not present

## 2020-10-05 DIAGNOSIS — E039 Hypothyroidism, unspecified: Secondary | ICD-10-CM | POA: Diagnosis not present

## 2020-10-05 DIAGNOSIS — M7918 Myalgia, other site: Secondary | ICD-10-CM

## 2020-10-05 DIAGNOSIS — M5030 Other cervical disc degeneration, unspecified cervical region: Secondary | ICD-10-CM | POA: Diagnosis not present

## 2020-10-05 DIAGNOSIS — M1712 Unilateral primary osteoarthritis, left knee: Secondary | ICD-10-CM

## 2020-10-05 DIAGNOSIS — G8929 Other chronic pain: Secondary | ICD-10-CM | POA: Diagnosis not present

## 2020-10-05 DIAGNOSIS — M792 Neuralgia and neuritis, unspecified: Secondary | ICD-10-CM

## 2020-10-05 DIAGNOSIS — G894 Chronic pain syndrome: Secondary | ICD-10-CM | POA: Diagnosis not present

## 2020-10-05 MED ORDER — HYDROCODONE-ACETAMINOPHEN 10-325 MG PO TABS: 1.0000 | ORAL_TABLET | Freq: Four times a day (QID) | ORAL | 0 refills | Status: DC | PRN

## 2020-10-05 MED ORDER — HYDROCODONE-ACETAMINOPHEN 10-325 MG PO TABS: 1.0000 | ORAL_TABLET | Freq: Four times a day (QID) | ORAL | 0 refills | Status: AC | PRN

## 2020-10-05 NOTE — Progress Notes (Signed)
PROVIDER NOTE: Information contained herein reflects review and annotations entered in association with encounter. Interpretation of such information and data should be left to medically-trained personnel. Information provided to patient can be located elsewhere in the medical record under "Patient Instructions". Document created using STT-dictation technology, any transcriptional errors that may result from process are unintentional.    Patient: Jane [name]  Service Category: E/M  Provider: Gillis Santa, MD  DOB: 1966/01/08  DOS: 10/05/2020  Specialty: Interventional Pain Management  MRN: [id number]  Setting: Ambulatory outpatient  PCP: [name], MD  Type: Established Patient    Referring Provider: [name], MD  Location: Office  Delivery: Face-to-face     HPI  Ms. Ylianna Almanzar, a 54 y.o. year old female, is here today because of her Chronic pain syndrome [G89.4]. Ms. Scalzo primary complain today is Back Pain (lower) Last encounter: My last encounter with her was on 07/11/2020. Pertinent problems: Ms. Kauer has History of DVT (deep vein thrombosis); Bipolar 1 disorder ([place]); Depression, major, recurrent ([place]); Radicular low back pain; May-Thurner syndrome; Iliac vein stenosis, left; Morbid obesity ([place]); Peripheral neuropathy; Chronic low back pain (Bilateral) w/ sciatica (Bilateral); Chronic anticoagulation (Coumadin); CKD stage 3 due to type 2 diabetes mellitus ([place]); Displacement of lumbar intervertebral disc without myelopathy; Chronic pain syndrome; Pharmacologic therapy; Abnormal MRI, cervical spine (01/08/2017); Abnormal MRI, lumbar spine (05/11/2014); DDD (degenerative disc disease), cervical; DDD (degenerative disc disease), lumbar; Failed back surgical syndrome; Diabetic peripheral neuropathy ([place]); Neurogenic pain; and Chronic musculoskeletal pain on their pertinent problem list. Pain Assessment: Severity of Chronic pain is reported as a 8 /10. Location:  Back Lower/left leg to the foot. Onset: More than a month ago. Quality: Sharp. Timing: Constant. Modifying factor(s): medications. Vitals:  height is $RemoveB'5\' 10"'sirXNSKV$  (1.778 m) and weight is 256 lb (116.1 kg). Her temporal temperature is 97.2 F (36.2 C) (abnormal). Her blood pressure is 151/96 (abnormal) and her pulse is 69. Her respiration is 16 and oxygen saturation is 95%.   Reason for encounter: medication management.   Is planning on having left knee meniscus surgery (Arthroscopy left knee partial medial meniscectomy versus repair) however needs clearance from PCP first regarding her Coumadin and bridging with Lovenox. She has been told that this is a high risk surgery but she is experiencing severe pain and functional limitation as result of her left knee meniscus injury.  Patient continues multimodal pain regimen as prescribed.  States that it provides pain relief and improvement in functional status.   Pharmacotherapy Assessment   Analgesic: Hydrocodone 10 mg 4 times daily as needed, quantity 120/month; MME equals 40   Monitoring: [place] PMP: PDMP reviewed during this encounter.       Pharmacotherapy: No side-effects or adverse reactions reported. Compliance: No problems identified. Effectiveness: Clinically acceptable.  Landis Martins, RN  10/05/2020  3:05 PM  Sign when Signing Visit Nursing Pain Medication Assessment:  Safety precautions to be maintained throughout the outpatient stay will include: orient to surroundings, keep bed in low position, maintain call bell within reach at all times, provide assistance with transfer out of bed and ambulation.  Medication Inspection Compliance: Pill count conducted under aseptic conditions, in front of the patient. Neither the pills nor the bottle was removed from the patient's sight at any time. Once count was completed pills were immediately returned to the patient in their original bottle.  Medication: Hydrocodone/APAP Pill/Patch Count: 29 of 120  pills remain Pill/Patch Appearance: Markings consistent with prescribed medication Bottle Appearance: Standard pharmacy  container. Clearly labeled. Filled Date:  09/13/2020 Last Medication intake:  Today    UDS:  Summary  Date Value Ref Range Status  04/24/2020 Note  Final    Comment:    ==================================================================== Compliance Drug Analysis, Ur ==================================================================== Test                             Result       Flag       Units  Drug Present and Declared for Prescription Verification   Amphetamine                    13465        EXPECTED   ng/mg creat    Amphetamine is available as a schedule II prescription drug.    Hydrocodone                    335          EXPECTED   ng/mg creat   Dihydrocodeine                 104          EXPECTED   ng/mg creat   Norhydrocodone                 284          EXPECTED   ng/mg creat    Sources of hydrocodone include scheduled prescription medications.    Dihydrocodeine and norhydrocodone are expected metabolites of    hydrocodone. Dihydrocodeine is also available as a scheduled    prescription medication.    Pregabalin                     PRESENT      EXPECTED   Cyclobenzaprine                PRESENT      EXPECTED   Desmethylcyclobenzaprine       PRESENT      EXPECTED    Desmethylcyclobenzaprine is an expected metabolite of    cyclobenzaprine.    Fluoxetine                     PRESENT      EXPECTED   Norfluoxetine                  PRESENT      EXPECTED    Norfluoxetine is an expected metabolite of fluoxetine.    Acetaminophen                  PRESENT      EXPECTED  Drug Absent but Declared for Prescription Verification   Alprazolam                     Not Detected UNEXPECTED ng/mg creat   Salicylate                     Not Detected UNEXPECTED    Aspirin, as indicated in the declared medication list, is not always    detected even when used as  directed.  ==================================================================== Test                      Result    Flag   Units      Ref Range   Creatinine  55               mg/dL      >=20 ==================================================================== Declared Medications:  The flagging and interpretation on this report are based on the  following declared medications.  Unexpected results may arise from  inaccuracies in the declared medications.   **Note: The testing scope of this panel includes these medications:   Alprazolam (Xanax)  Amphetamine (Adderall)  Cyclobenzaprine (Flexeril)  Fluoxetine (Prozac)  Hydrocodone (Norco)  Pregabalin (Lyrica)   **Note: The testing scope of this panel does not include small to  moderate amounts of these reported medications:   Acetaminophen (Norco)  Aspirin   **Note: The testing scope of this panel does not include the  following reported medications:   Atorvastatin (Lipitor)  Cetirizine (Zyrtec)  Eye Drop  Hydrochlorothiazide (Hydrodiuril)  Levothyroxine (Synthroid)  Linaclotide (Linzess)  Lisinopril (Zestril)  Magnesium  Multivitamin  Olopatadine  Pantoprazole (Protonix)  Potassium (Klor-Con)  Ropinirole (Requip)  Vitamin C  Vitamin D2 (Drisdol)  Warfarin (Coumadin) ==================================================================== For clinical consultation, please call [phone]. ====================================================================      ROS  Constitutional: Denies any fever or chills Gastrointestinal: No reported hemesis, hematochezia, vomiting, or acute GI distress Musculoskeletal: left knee pain, low back pain Neurological: No reported episodes of acute onset apraxia, aphasia, dysarthria, agnosia, amnesia, paralysis, loss of coordination, or loss of consciousness  Medication Review  ALPRAZolam, FLUoxetine, Garlic, HYDROcodone-acetaminophen, Magnesium, Olopatadine HCl,  Omega-3 Fatty Acids, Vitamin D (Ergocalciferol), amphetamine-dextroamphetamine, aspirin EC, atorvastatin, cetirizine, cycloSPORINE, hydrochlorothiazide, levothyroxine, linaclotide, methocarbamol, multivitamin, pantoprazole, potassium chloride, pregabalin, rOPINIRole, and warfarin  History Review  Allergy: Ms. Gutkowski is allergic to aripiprazole, seroquel [quetiapine fumarate], chlorpromazine, and gabapentin. Drug: Ms. Conley  reports no history of drug use. Alcohol:  reports no history of alcohol use. Tobacco:  reports that she has never smoked. She has never used smokeless tobacco. Social: Ms. Krizek  reports that she has never smoked. She has never used smokeless tobacco. She reports that she does not drink alcohol and does not use drugs. Medical:  has a past medical history of ADD (attention deficit disorder), Anxiety, Back pain, Bilateral swelling of feet, Bipolar 1 disorder ([place]), Bipolar 1 disorder ([place]), Bipolar disorder ([place]), Chewing difficulty, Chronic fatigue syndrome, Chronic kidney disease, Constipation, Depression, Diabetes mellitus without complication ([place]), Dyspnea, GERD (gastroesophageal reflux disease), Headache, High cholesterol, History of blood clots, History of DVT (deep vein thrombosis), Hypertension, Hypothyroidism, Joint pain, Neuropathy, Obsessive-compulsive disorder, Peripheral vascular disease ([place]), Prediabetes, Respiratory failure requiring intubation ([place]), Restless leg syndrome, Shortness of breath, Sleep apnea, Swallowing difficulty, Thrombocytopenia ([place]) (09/15/2019), Trigger thumb of left hand (01/2018), and Trigger thumb of right hand. Surgical: Ms. Hollenkamp  has a past surgical history that includes Cholecystectomy; Trigger finger release (Right, 12/01/2017); Abdominal hysterectomy (06/2016); Lumbar fusion (11/21/2000); Lumbar spine surgery; Trigger finger release (Left, 01/26/2018); LOWER EXTREMITY VENOGRAPHY (N/A, 08/17/2018); Femoral-femoral Bypass Graft  (Left, 11/24/2018); AV fistula placement (Left, 11/24/2018); LOWER EXTREMITY VENOGRAPHY (Bilateral, 03/09/2019); Patch angioplasty (Left, 03/30/2019); Ultrasound guidance for vascular access (Right, 03/30/2019); Femoral artery debridement (03/30/2019); Insertion of iliac stent (03/30/2019); Groin debridement (Left, 04/27/2019); Application if wound vac (Left, 04/27/2019); LOWER EXTREMITY VENOGRAPHY (Left, 08/16/2019); PERIPHERAL VASCULAR INTERVENTION (Left, 08/16/2019); Back surgery; and Colonoscopy with propofol (N/A, 11/22/2019). Family: family history includes ADD / ADHD in her daughter; Anxiety disorder in her daughter; Bipolar disorder in her daughter, maternal aunt, and mother; Depression in her mother; Diabetes in her father; Drug abuse in her daughter and daughter; Heart disease in her brother, father, and mother;  Hyperlipidemia in her brother, father, and mother; Hypertension in her brother, father, mother, and sister; Obesity in her father and mother; Sleep apnea in her father and mother; Stroke in her mother; Suicidality in her maternal aunt.  Laboratory Chemistry Profile   Renal Lab Results  Component Value Date   BUN 35 (H) 07/18/2020   CREATININE 1.32 (H) 07/18/2020   BCR 18 03/29/2020   GFRAA 53 (L) 07/18/2020   GFRNONAA 46 (L) 07/18/2020     Hepatic Lab Results  Component Value Date   AST 18 07/18/2020   ALT 19 07/18/2020   ALBUMIN 3.9 07/18/2020   ALKPHOS 102 07/18/2020     Electrolytes Lab Results  Component Value Date   NA 140 07/18/2020   K 4.6 07/18/2020   CL 104 07/18/2020   CALCIUM 9.6 07/18/2020   MG 2.2 04/04/2019   PHOS 4.0 04/04/2019     Bone Lab Results  Component Value Date   VD25OH 37.20 07/18/2020     Inflammation (CRP: Acute Phase) (ESR: Chronic Phase) Lab Results  Component Value Date   CRP 11 (H) 02/16/2020   ESRSEDRATE 33 02/16/2020   LATICACIDVEN 1.2 01/05/2020       Note: Above Lab results reviewed.  Recent Imaging Review  VAS [place] IVC/ILIAC  (VENOUS ONLY) IVC/ILIAC STUDY  Vascular Interventions: 08/16/2019 Balloon angioplasty of the left common                         iliac/external iliac vein stent stenosis.                           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 Stenting of the IVC through the CFV.  Limitations: Air/bowel gas and obesity.    Performing Technologist: Ralene Cork RVT    Examination Guidelines: A complete evaluation includes B-mode imaging, spectral Doppler, color Doppler, and power Doppler as needed of all accessible portions of each vessel. Bilateral testing is considered an integral part of a complete examination. Limited examinations for reoccurring indications may be performed as noted.    IVC/Iliac Findings: +----------+------+--------+---------------------------------------------------+    IVC    PatentThrombus                     Comments                       +----------+------+--------+---------------------------------------------------+ IVC Prox  patent        Unable to visualize IVC in the longitudinal                                 orientation. Limited to transverse.                 +----------+------+--------+---------------------------------------------------+ IVC Mid   patent                                                            +----------+------+--------+---------------------------------------------------+ IVC Distalpatent                                                            +----------+------+--------+---------------------------------------------------+    +------------------+---------+-----------+---------+-----------+---------------+  CIV        RT-PatentRT-ThrombusLT-PatentLT-Thrombus   Comments     +------------------+---------+-----------+---------+-----------+---------------+ Common Iliac Prox                      patent                              +------------------+---------+-----------+---------+-----------+---------------+ Common Iliac Mid                                          not visualized. +------------------+---------+-----------+---------+-----------+---------------+ Common Iliac                           patent                             Distal                                                                    +------------------+---------+-----------+---------+-----------+---------------+     +-------------------------+---------+-----------+---------+-----------+--------+            EIV           RT-PatentRT-ThrombusLT-PatentLT-ThrombusComments +-------------------------+---------+-----------+---------+-----------+--------+ External Iliac Vein Prox                      patent                      +-------------------------+---------+-----------+---------+-----------+--------+ External Iliac Vein Mid                       patent                      +-------------------------+---------+-----------+---------+-----------+--------+ External Iliac Vein                           patent                      Distal                                                                    +-------------------------+---------+-----------+---------+-----------+--------+        Summary: IVC/Iliac: IVC, CIV, and EIV appear patent. There appears to be a stenosis at the distal CFV stent. Waveform characteristics resemble those of an arteriovenous fistula.   *See table(s) above for measurements and observations.   Electronically signed by Servando Snare MD on 08/04/2020 at 9:31:08 AM.       Final   Note: Reviewed        Physical Exam  General appearance: Well nourished, well developed, and well hydrated. In no apparent acute distress Mental status: Alert, oriented x 3 (person, place, & time)       Respiratory: No  evidence of acute respiratory distress Eyes: PERLA Vitals:  BP (!) 151/96   Pulse 69   Temp (!) 97.2 F (36.2 C) (Temporal)   Resp 16   Ht $R'5\' 10"'Pq$  (1.778 m)   Wt 256 lb (116.1 kg)   LMP  (LMP Unknown)   SpO2 95%   BMI 36.73 kg/m  BMI: Estimated body mass index is 36.73 kg/m as calculated from the following:   Height as of this encounter: $RemoveBeforeD'5\' 10"'CBRcZKpRVmzaOG$  (1.778 m).   Weight as of this encounter: 256 lb (116.1 kg). Ideal: Ideal body weight: 68.5 kg (151 lb 0.2 oz) Adjusted ideal body weight: 87.5 kg (193 lb 0.1 oz)  Lumbar Spine Area Exam  Skin & Axial Inspection:Well healed scar from previous spine surgery detected Alignment:Asymmetric Functional JJO:ACZYSAYTK ROM Stability:No instability detected Muscle Tone/Strength:Functionally intact. No obvious neuro-muscular anomalies detected. Sensory (Neurological):Dermatomal pain pattern  Provocative Tests: Hyperextension/rotation test:(+)bilaterally for facet joint pain. Lumbar quadrant test (Kemp's test):(+)bilaterally for facet joint pain.   Gait & Posture Assessment  Ambulation:Limited Gait:Antalgic gait (limping) Posture:Difficulty standing up straight, due to pain Lower Extremity Exam    Side:Right lower extremity  Side:Left lower extremity  Stability:No instability observed  Stability:No instability observed  Skin & Extremity Inspection:Skin color, temperature, and hair growth are WNL. No peripheral edema or cyanosis. No masses, redness, swelling, asymmetry, or associated skin lesions. No contractures.  Skin & Extremity Inspection:Skin color, temperature, and hair growth are WNL. No peripheral edema or cyanosis. No masses, redness, swelling, asymmetry, or associated skin lesions. No contractures.  Functional ZSW:FUXNATFTD ROMfor hip and knee joints   Functional DUK:GURKYHCWC ROMfor hip and knee joints, pain with weight bearing and lateral rotation   Muscle Tone/Strength:Functionally intact. No obvious neuro-muscular anomalies  detected.  Muscle Tone/Strength:Functionally intact. No obvious neuro-muscular anomalies detected.  Sensory (Neurological):Unimpaired  Sensory (Neurological): Arthropathic arthralgia for left knee severe pain with weightbearing  DTR: Patellar:deferred today Achilles:deferred today Plantar:deferred today  DTR: Patellar:deferred today Achilles:deferred today Plantar:deferred today  Palpation:No palpable anomalies  Palpation:No palpable anomalies    Assessment   Status Diagnosis  Controlled Controlled Controlled 1. Chronic pain syndrome   2. Arthritis of left knee   3. DDD (degenerative disc disease), cervical   4. Failed back surgical syndrome   5. Diabetic peripheral neuropathy ([place])   6. Neurogenic pain   7. Chronic musculoskeletal pain       Plan of Care  Ms. Jinelle Butchko has a current medication list which includes the following long-term medication(s): amphetamine-dextroamphetamine, atorvastatin, cetirizine, fluoxetine, hydrochlorothiazide, levothyroxine, linaclotide, pantoprazole, potassium chloride, ropinirole, warfarin, and amphetamine-dextroamphetamine.  I had extensive discussion with the patient regarding perioperative pain management.  I informed her that it is in her best interest to try and reduce her opioid intake so that her tolerance decreases so that these medications can be more effective in the postoperative period to help control her acute postoperative pain.  Patient continues Lyrica 100 mg 4 times daily along with Robaxin 750 mg twice daily.  I will refill her hydrocodone as below.  She will follow back up with me in 3 months.  I wish her the best with her left knee meniscus surgery.  Pharmacotherapy (Medications Ordered): Meds ordered this encounter  Medications  . HYDROcodone-acetaminophen (NORCO) 10-325 MG tablet    Sig: Take 1 tablet by mouth every 6 (six) hours as needed for severe pain. Must last 30 days.    Dispense:   120 tablet    Refill:  0    Chronic Pain. (STOP  Act - Not applicable). Fill one day early if closed on scheduled refill date.  Marland Kitchen HYDROcodone-acetaminophen (NORCO) 10-325 MG tablet    Sig: Take 1 tablet by mouth every 6 (six) hours as needed for severe pain. Must last 30 days.    Dispense:  120 tablet    Refill:  0    Chronic Pain. (STOP Act - Not applicable). Fill one day early if closed on scheduled refill date.  Marland Kitchen HYDROcodone-acetaminophen (NORCO) 10-325 MG tablet    Sig: Take 1 tablet by mouth every 6 (six) hours as needed for severe pain. Must last 30 days.    Dispense:  120 tablet    Refill:  0    Chronic Pain. (STOP Act - Not applicable). Fill one day early if closed on scheduled refill date.   Continue Robaxin and Lyrica as prescribed.   Follow-up plan:   Return in about 3 months (around 01/03/2021) for Medication Management, in person.   Recent Visits Date Type Provider Dept  07/11/20 Office Visit Gillis Santa, MD [facility]  Showing recent visits within past 90 days and meeting all other requirements Today's Visits Date Type Provider Dept  10/05/20 Office Visit Gillis Santa, MD [facility]  Showing today's visits and meeting all other requirements Future Appointments No visits were found meeting these conditions. Showing future appointments within next 90 days and meeting all other requirements  I discussed the assessment and treatment plan with the patient. The patient was provided an opportunity to ask questions and all were answered. The patient agreed with the plan and demonstrated an understanding of the instructions.  Patient advised to call back or seek an in-person evaluation if the symptoms or condition worsens.  Duration of encounter: 30 minutes.  Note by: Gillis Santa, MD Date: 10/05/2020; Time: 3:22 PM

## 2020-10-05 NOTE — Progress Notes (Signed)
Nursing Pain Medication Assessment:  Safety precautions to be maintained throughout the outpatient stay will include: orient to surroundings, keep bed in low position, maintain call bell within reach at all times, provide assistance with transfer out of bed and ambulation.  Medication Inspection Compliance: Pill count conducted under aseptic conditions, in front of the patient. Neither the pills nor the bottle was removed from the patient's sight at any time. Once count was completed pills were immediately returned to the patient in their original bottle.  Medication: Hydrocodone/APAP Pill/Patch Count: 29 of 120 pills remain Pill/Patch Appearance: Markings consistent with prescribed medication Bottle Appearance: Standard pharmacy container. Clearly labeled. Filled Date:  09/13/2020 Last Medication intake:  Today

## 2020-10-06 DIAGNOSIS — E039 Hypothyroidism, unspecified: Secondary | ICD-10-CM | POA: Diagnosis not present

## 2020-10-07 DIAGNOSIS — E039 Hypothyroidism, unspecified: Secondary | ICD-10-CM | POA: Diagnosis not present

## 2020-10-08 DIAGNOSIS — E039 Hypothyroidism, unspecified: Secondary | ICD-10-CM | POA: Diagnosis not present

## 2020-10-09 DIAGNOSIS — E039 Hypothyroidism, unspecified: Secondary | ICD-10-CM | POA: Diagnosis not present

## 2020-10-10 ENCOUNTER — Encounter: Payer: Medicaid Other | Admitting: Student in an Organized Health Care Education/Training Program

## 2020-10-10 DIAGNOSIS — R350 Frequency of micturition: Secondary | ICD-10-CM | POA: Diagnosis not present

## 2020-10-10 DIAGNOSIS — R399 Unspecified symptoms and signs involving the genitourinary system: Secondary | ICD-10-CM | POA: Diagnosis not present

## 2020-10-10 DIAGNOSIS — E039 Hypothyroidism, unspecified: Secondary | ICD-10-CM | POA: Diagnosis not present

## 2020-10-11 DIAGNOSIS — L280 Lichen simplex chronicus: Secondary | ICD-10-CM | POA: Diagnosis not present

## 2020-10-11 DIAGNOSIS — L905 Scar conditions and fibrosis of skin: Secondary | ICD-10-CM | POA: Diagnosis not present

## 2020-10-11 DIAGNOSIS — D225 Melanocytic nevi of trunk: Secondary | ICD-10-CM | POA: Diagnosis not present

## 2020-10-11 DIAGNOSIS — L820 Inflamed seborrheic keratosis: Secondary | ICD-10-CM | POA: Diagnosis not present

## 2020-10-11 DIAGNOSIS — E039 Hypothyroidism, unspecified: Secondary | ICD-10-CM | POA: Diagnosis not present

## 2020-10-11 DIAGNOSIS — Z872 Personal history of diseases of the skin and subcutaneous tissue: Secondary | ICD-10-CM | POA: Diagnosis not present

## 2020-10-11 DIAGNOSIS — D1801 Hemangioma of skin and subcutaneous tissue: Secondary | ICD-10-CM | POA: Diagnosis not present

## 2020-10-12 ENCOUNTER — Ambulatory Visit (INDEPENDENT_AMBULATORY_CARE_PROVIDER_SITE_OTHER): Payer: Medicaid Other | Admitting: Family Medicine

## 2020-10-12 ENCOUNTER — Other Ambulatory Visit: Payer: Self-pay

## 2020-10-12 ENCOUNTER — Encounter (INDEPENDENT_AMBULATORY_CARE_PROVIDER_SITE_OTHER): Payer: Self-pay | Admitting: Family Medicine

## 2020-10-12 VITALS — BP 137/82 | HR 56 | Temp 97.8°F | Ht 70.0 in | Wt 256.0 lb

## 2020-10-12 DIAGNOSIS — E559 Vitamin D deficiency, unspecified: Secondary | ICD-10-CM | POA: Diagnosis not present

## 2020-10-12 DIAGNOSIS — Z6836 Body mass index (BMI) 36.0-36.9, adult: Secondary | ICD-10-CM

## 2020-10-12 DIAGNOSIS — E1165 Type 2 diabetes mellitus with hyperglycemia: Secondary | ICD-10-CM

## 2020-10-12 DIAGNOSIS — E039 Hypothyroidism, unspecified: Secondary | ICD-10-CM | POA: Diagnosis not present

## 2020-10-12 MED ORDER — VITAMIN D (ERGOCALCIFEROL) 1.25 MG (50000 UNIT) PO CAPS: 50000.0000 [IU] | ORAL_CAPSULE | ORAL | 0 refills | Status: DC

## 2020-10-13 DIAGNOSIS — M7662 Achilles tendinitis, left leg: Secondary | ICD-10-CM | POA: Diagnosis not present

## 2020-10-13 DIAGNOSIS — M7752 Other enthesopathy of left foot: Secondary | ICD-10-CM | POA: Diagnosis not present

## 2020-10-13 DIAGNOSIS — E039 Hypothyroidism, unspecified: Secondary | ICD-10-CM | POA: Diagnosis not present

## 2020-10-14 DIAGNOSIS — E039 Hypothyroidism, unspecified: Secondary | ICD-10-CM | POA: Diagnosis not present

## 2020-10-15 DIAGNOSIS — E039 Hypothyroidism, unspecified: Secondary | ICD-10-CM | POA: Diagnosis not present

## 2020-10-16 ENCOUNTER — Other Ambulatory Visit (HOSPITAL_COMMUNITY): Payer: Self-pay | Admitting: Surgery

## 2020-10-16 DIAGNOSIS — D696 Thrombocytopenia, unspecified: Secondary | ICD-10-CM

## 2020-10-16 DIAGNOSIS — E039 Hypothyroidism, unspecified: Secondary | ICD-10-CM | POA: Diagnosis not present

## 2020-10-16 DIAGNOSIS — D649 Anemia, unspecified: Secondary | ICD-10-CM

## 2020-10-16 NOTE — Progress Notes (Signed)
Chief Complaint:   OBESITY Jane [name] is here to discuss her progress with her obesity treatment plan along with follow-up of her obesity related diagnoses. Takela is on the [place] + 500 calories and states she is following her eating plan approximately 60% of the time. Candise states she is doing 5,000 steps 4 times per week.  Today's visit was #: 12 Starting weight: 283 lbs Starting date: 03/29/2020 Today's weight: 256 lbs Today's date: 10/12/2020 Total lbs lost to date: 27 lbs Total lbs lost since last in-office visit: 0 lbs  Interim History: Shelbey had a good Thanksgiving with her daughter at [employer]. She did not indulge in dessert. Janal does not know what she is doing for Christmas at this time. She is going to see her PCP December 21 for clearance of knee surgery. She is trying to incorporate more walking but with her knee pain, she is limited.   Subjective:   1. Vitamin D deficiency Leigha denies nausea, vomiting, and muscle weakness. She does complain of fatigue. She is on prescription Vit D.  2. Type 2 diabetes mellitus with hyperglycemia, without long-term current use of insulin (HCC) Jane [name]'s [name] was 6.2 on 03/29/2020. She is not on any medication.  Assessment/Plan:   1. Vitamin D deficiency Refill Vit D for 1 month, as per below. Low Vitamin D level contributes to fatigue and are associated with obesity, breast, and colon cancer. She agrees to continue to take prescription Vitamin D @50 ,000 IU every week and will follow-up for routine testing of Vitamin D, at least 2-3 times per year to avoid over-replacement.  - Vitamin D, Ergocalciferol, (DRISDOL) 1.25 MG (50000 UNIT) CAPS capsule; Take 1 capsule (50,000 Units total) by mouth every 7 (seven) days.  Dispense: 4 capsule; Refill: 0  2. Type 2 diabetes mellitus with hyperglycemia, without long-term current use of insulin ([place]) Repeat labs in January 2022 at next appointment. Good blood sugar control is important to  decrease the likelihood of diabetic complications such as nephropathy, neuropathy, limb loss, blindness, coronary artery disease, and death. Intensive lifestyle modification including diet, exercise and weight loss are the first line of treatment for diabetes.   3. Class 2 severe obesity with serious comorbidity and body mass index (BMI) of 36.0 to 36.9 in adult, unspecified obesity type (HCC) Jane [name] is currently in the action stage of change. As such, her goal is to continue with weight loss efforts. She has agreed to the [employer] + 500 calories.   Exercise goals: As is  Behavioral modification strategies: increasing lean protein intake, meal planning and cooking strategies, keeping healthy foods in the home, holiday eating strategies  and planning for success.  Bethzaida has agreed to follow-up with our clinic in 4 weeks. She was informed of the importance of frequent follow-up visits to maximize her success with intensive lifestyle modifications for her multiple health conditions.   Objective:   Blood pressure 137/82, pulse (!) 56, temperature 97.8 F (36.6 C), temperature source Oral, height 5\' 10"  (1.778 m), weight 256 lb (116.1 kg), SpO2 98 %. Body mass index is 36.73 kg/m.  General: Cooperative, alert, well developed, in no acute distress. HEENT: Conjunctivae and lids unremarkable. Cardiovascular: Regular rhythm.  Lungs: Normal work of breathing. Neurologic: No focal deficits.   Lab Results  Component Value Date   CREATININE 1.32 (H) 07/18/2020   BUN 35 (H) 07/18/2020   NA 140 07/18/2020   K 4.6 07/18/2020   CL 104 07/18/2020   CO2  25 07/18/2020   Lab Results  Component Value Date   ALT 19 07/18/2020   AST 18 07/18/2020   ALKPHOS 102 07/18/2020   BILITOT 0.9 07/18/2020   Lab Results  Component Value Date   HGBA1C 6.2 (H) 03/29/2020   HGBA1C 6.3 (H) 10/25/2019   HGBA1C 6.3 (H) 03/26/2019   HGBA1C 7.5 11/18/2018   Lab Results  Component Value Date   INSULIN  21.5 03/29/2020   Lab Results  Component Value Date   TSH 0.107 (L) 05/18/2020   Lab Results  Component Value Date   CHOL 203 (H) 03/29/2020   HDL 37 (L) 03/29/2020   LDLCALC 114 (H) 03/29/2020   TRIG 296 (H) 03/29/2020   CHOLHDL 5.0 (H) 10/25/2019   Lab Results  Component Value Date   WBC 7.3 07/18/2020   HGB 13.2 07/18/2020   HCT 40.5 07/18/2020   MCV 93.1 07/18/2020   PLT 135 (L) 07/18/2020   Lab Results  Component Value Date   IRON 81 07/18/2020   TIBC 279 07/18/2020   FERRITIN 266 07/18/2020    Attestation Statements:   Reviewed by clinician on day of visit: allergies, medications, problem list, medical history, surgical history, family history, social history, and previous encounter notes.  Time spent on visit including pre-visit chart review and post-visit care and charting was 18 minutes.   Coral Ceo, am acting as transcriptionist for Coralie Common, MD.  I have reviewed the above documentation for accuracy and completeness, and I agree with the above. - Jinny Blossom, MD

## 2020-10-17 ENCOUNTER — Inpatient Hospital Stay (HOSPITAL_COMMUNITY): Payer: Medicaid Other | Attending: Hematology

## 2020-10-17 ENCOUNTER — Other Ambulatory Visit: Payer: Self-pay

## 2020-10-17 DIAGNOSIS — D696 Thrombocytopenia, unspecified: Secondary | ICD-10-CM

## 2020-10-17 DIAGNOSIS — M25562 Pain in left knee: Secondary | ICD-10-CM | POA: Insufficient documentation

## 2020-10-17 DIAGNOSIS — Z86718 Personal history of other venous thrombosis and embolism: Secondary | ICD-10-CM | POA: Insufficient documentation

## 2020-10-17 DIAGNOSIS — N390 Urinary tract infection, site not specified: Secondary | ICD-10-CM | POA: Diagnosis not present

## 2020-10-17 DIAGNOSIS — D649 Anemia, unspecified: Secondary | ICD-10-CM

## 2020-10-17 DIAGNOSIS — D509 Iron deficiency anemia, unspecified: Secondary | ICD-10-CM | POA: Insufficient documentation

## 2020-10-17 DIAGNOSIS — E039 Hypothyroidism, unspecified: Secondary | ICD-10-CM | POA: Diagnosis not present

## 2020-10-17 LAB — IRON AND TIBC
Iron: 69 ug/dL (ref 28–170)
Saturation Ratios: 22 % (ref 10.4–31.8)
TIBC: 312 ug/dL (ref 250–450)
UIBC: 243 ug/dL

## 2020-10-17 LAB — CBC WITH DIFFERENTIAL/PLATELET
Abs Immature Granulocytes: 0.02 10*3/uL (ref 0.00–0.07)
Basophils Absolute: 0 10*3/uL (ref 0.0–0.1)
Basophils Relative: 1 %
Eosinophils Absolute: 0.1 10*3/uL (ref 0.0–0.5)
Eosinophils Relative: 2 %
HCT: 44 % (ref 36.0–46.0)
Hemoglobin: 14.4 g/dL (ref 12.0–15.0)
Immature Granulocytes: 0 %
Lymphocytes Relative: 24 %
Lymphs Abs: 1.5 10*3/uL (ref 0.7–4.0)
MCH: 30.8 pg (ref 26.0–34.0)
MCHC: 32.7 g/dL (ref 30.0–36.0)
MCV: 94.2 fL (ref 80.0–100.0)
Monocytes Absolute: 0.4 10*3/uL (ref 0.1–1.0)
Monocytes Relative: 7 %
Neutro Abs: 4.2 10*3/uL (ref 1.7–7.7)
Neutrophils Relative %: 66 %
Platelets: 132 10*3/uL — ABNORMAL LOW (ref 150–400)
RBC: 4.67 MIL/uL (ref 3.87–5.11)
RDW: 12.9 % (ref 11.5–15.5)
WBC: 6.3 10*3/uL (ref 4.0–10.5)
nRBC: 0 % (ref 0.0–0.2)

## 2020-10-17 LAB — COMPREHENSIVE METABOLIC PANEL
ALT: 22 U/L (ref 0–44)
AST: 22 U/L (ref 15–41)
Albumin: 4 g/dL (ref 3.5–5.0)
Alkaline Phosphatase: 103 U/L (ref 38–126)
Anion gap: 9 (ref 5–15)
BUN: 39 mg/dL — ABNORMAL HIGH (ref 6–20)
CO2: 28 mmol/L (ref 22–32)
Calcium: 9.1 mg/dL (ref 8.9–10.3)
Chloride: 100 mmol/L (ref 98–111)
Creatinine, Ser: 1.34 mg/dL — ABNORMAL HIGH (ref 0.44–1.00)
GFR, Estimated: 47 mL/min — ABNORMAL LOW (ref >60)
Glucose, Bld: 137 mg/dL — ABNORMAL HIGH (ref 70–99)
Potassium: 3.6 mmol/L (ref 3.5–5.1)
Sodium: 137 mmol/L (ref 135–145)
Total Bilirubin: 0.5 mg/dL (ref 0.3–1.2)
Total Protein: 7.7 g/dL (ref 6.5–8.1)

## 2020-10-17 LAB — FERRITIN: Ferritin: 145 ng/mL (ref 11–307)

## 2020-10-17 LAB — FOLATE: Folate: 28.6 ng/mL (ref >5.9)

## 2020-10-17 LAB — LACTATE DEHYDROGENASE: LDH: 149 U/L (ref 98–192)

## 2020-10-17 LAB — VITAMIN D 25 HYDROXY (VIT D DEFICIENCY, FRACTURES): Vit D, 25-Hydroxy: 45.3 ng/mL (ref 30–100)

## 2020-10-17 LAB — VITAMIN B12: Vitamin B-12: 553 pg/mL (ref 180–914)

## 2020-10-18 ENCOUNTER — Ambulatory Visit (INDEPENDENT_AMBULATORY_CARE_PROVIDER_SITE_OTHER): Payer: Medicaid Other

## 2020-10-18 ENCOUNTER — Other Ambulatory Visit: Payer: Self-pay

## 2020-10-18 DIAGNOSIS — E039 Hypothyroidism, unspecified: Secondary | ICD-10-CM | POA: Diagnosis not present

## 2020-10-18 DIAGNOSIS — Z86718 Personal history of other venous thrombosis and embolism: Secondary | ICD-10-CM | POA: Diagnosis not present

## 2020-10-18 LAB — POCT INR
INR: 2.3 (ref 2.0–3.0)
PT: 27

## 2020-10-18 NOTE — Patient Instructions (Signed)
Description    5mg  daily except 2.5mg  Mondays and Fridays.  Recheck 4 weeks

## 2020-10-19 DIAGNOSIS — Z90710 Acquired absence of both cervix and uterus: Secondary | ICD-10-CM | POA: Diagnosis not present

## 2020-10-19 DIAGNOSIS — Z9049 Acquired absence of other specified parts of digestive tract: Secondary | ICD-10-CM | POA: Diagnosis not present

## 2020-10-19 DIAGNOSIS — F319 Bipolar disorder, unspecified: Secondary | ICD-10-CM | POA: Diagnosis not present

## 2020-10-19 DIAGNOSIS — G2581 Restless legs syndrome: Secondary | ICD-10-CM | POA: Diagnosis not present

## 2020-10-19 DIAGNOSIS — Z79899 Other long term (current) drug therapy: Secondary | ICD-10-CM | POA: Diagnosis not present

## 2020-10-19 DIAGNOSIS — I10 Essential (primary) hypertension: Secondary | ICD-10-CM | POA: Diagnosis not present

## 2020-10-19 DIAGNOSIS — N3001 Acute cystitis with hematuria: Secondary | ICD-10-CM | POA: Diagnosis not present

## 2020-10-19 DIAGNOSIS — E039 Hypothyroidism, unspecified: Secondary | ICD-10-CM | POA: Diagnosis not present

## 2020-10-19 DIAGNOSIS — E0590 Thyrotoxicosis, unspecified without thyrotoxic crisis or storm: Secondary | ICD-10-CM | POA: Diagnosis not present

## 2020-10-19 DIAGNOSIS — F419 Anxiety disorder, unspecified: Secondary | ICD-10-CM | POA: Diagnosis not present

## 2020-10-19 DIAGNOSIS — E785 Hyperlipidemia, unspecified: Secondary | ICD-10-CM | POA: Diagnosis not present

## 2020-10-19 DIAGNOSIS — Z86718 Personal history of other venous thrombosis and embolism: Secondary | ICD-10-CM | POA: Diagnosis not present

## 2020-10-20 DIAGNOSIS — E039 Hypothyroidism, unspecified: Secondary | ICD-10-CM | POA: Diagnosis not present

## 2020-10-21 DIAGNOSIS — E039 Hypothyroidism, unspecified: Secondary | ICD-10-CM | POA: Diagnosis not present

## 2020-10-22 DIAGNOSIS — E039 Hypothyroidism, unspecified: Secondary | ICD-10-CM | POA: Diagnosis not present

## 2020-10-23 DIAGNOSIS — E039 Hypothyroidism, unspecified: Secondary | ICD-10-CM | POA: Diagnosis not present

## 2020-10-24 ENCOUNTER — Other Ambulatory Visit: Payer: Self-pay

## 2020-10-24 ENCOUNTER — Telehealth: Payer: Self-pay | Admitting: Radiology

## 2020-10-24 ENCOUNTER — Ambulatory Visit (INDEPENDENT_AMBULATORY_CARE_PROVIDER_SITE_OTHER): Payer: Medicaid Other | Admitting: Family Medicine

## 2020-10-24 ENCOUNTER — Telehealth: Payer: Self-pay | Admitting: Orthopedic Surgery

## 2020-10-24 ENCOUNTER — Other Ambulatory Visit: Payer: Self-pay | Admitting: Orthopedic Surgery

## 2020-10-24 ENCOUNTER — Encounter: Payer: Self-pay | Admitting: Orthopedic Surgery

## 2020-10-24 ENCOUNTER — Encounter: Payer: Self-pay | Admitting: Family Medicine

## 2020-10-24 VITALS — BP 147/82 | HR 54 | Temp 97.6°F | Wt 265.0 lb

## 2020-10-24 DIAGNOSIS — N1831 Chronic kidney disease, stage 3a: Secondary | ICD-10-CM

## 2020-10-24 DIAGNOSIS — E039 Hypothyroidism, unspecified: Secondary | ICD-10-CM | POA: Diagnosis not present

## 2020-10-24 DIAGNOSIS — E1122 Type 2 diabetes mellitus with diabetic chronic kidney disease: Secondary | ICD-10-CM | POA: Diagnosis not present

## 2020-10-24 DIAGNOSIS — Z01818 Encounter for other preprocedural examination: Secondary | ICD-10-CM

## 2020-10-24 LAB — POCT GLYCOSYLATED HEMOGLOBIN (HGB A1C): Hemoglobin A1C: 5.8 % — AB (ref 4.0–5.6)

## 2020-10-24 MED ORDER — ENOXAPARIN SODIUM 120 MG/0.8ML ~~LOC~~ SOLN: 120.0000 mg | Freq: Two times a day (BID) | SUBCUTANEOUS | 0 refills | Status: DC

## 2020-10-24 NOTE — Progress Notes (Signed)
[place], [name] [address]   CLINIC:  Medical Oncology/Hematology  Jefferson, Dionne Bucy, MD [address] [place] [address]   DIAGNOSIS: Iron deficiency anemia  CURRENT THERAPY: Intermittent iron infusions  INTERVAL HISTORY: Jane [name] is a 54 y.o. female with a diagnosis of an iron deficiency anemia.  She was last contacted on 07/20/2020 during a telemedicine visit.  Her most recent labs from 10/17/2020 returned showing a vitamin D level of 45.3, CBC with hemoglobin of 14.4, hematocrit 44.0 and MCV of 94.2, chemistry panel with a glucose of 137, BUN 39, and creatinine 1.34, LDH 149, iron 69, TIBC 312, saturation ratio 22, ferritin 145, folate 28.6, and vitamin B12 of 553.  She reports having constipation, a recent urinary tract infection for which she is taking antibiotics, arm and leg numbness and burning, left knee and back pain.  Her energy level is at approximately 100% and her appetite is at 100%.  She is pending left knee surgery for meniscus tear on 11/14/2020.  She is currently on Coumadin with her INR returning at 2.3 this week.  She will be placed on Lovenox perioperatively.  She was seen at the [facility] yesterday.  She reports that her hemoglobin [name] was 5.8, her TSH was 2.768 and that she had an EKG done yesterday.  She denies any additional issues of concern.    Patient Active Problem List   Diagnosis Date Noted  . Chronic pain syndrome 04/24/2020  . Pharmacologic therapy 04/24/2020  . Disorder of skeletal system 04/24/2020  . Problems influencing health status 04/24/2020  . History of thrombocytopenia 04/24/2020  . Abnormal MRI, cervical spine (01/08/2017) 04/24/2020  . Abnormal MRI, lumbar spine (05/11/2014) 04/24/2020  . DDD (degenerative disc disease), cervical 04/24/2020  . DDD (degenerative disc disease), lumbar 04/24/2020  . 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 Diabetic peripheral neuropathy  ([place]) 04/24/2020  . Neurogenic pain 04/24/2020  . Chronic musculoskeletal pain 04/24/2020  . Other proteinuria 03/30/2020  . Myalgia 03/15/2020  . Bilateral hand swelling 02/16/2020  . Thrush 01/20/2020  . Morbid obesity ([place]) 10/25/2019  . History of back surgery 10/25/2019  . Peripheral neuropathy 10/25/2019  . Restless leg syndrome 10/25/2019  . Chronic low back pain (Bilateral) w/ sciatica (Bilateral) 10/25/2019  . Essential hypertension 10/25/2019  . Type 2 diabetes mellitus with stage 3 chronic kidney disease, without long-term current use of insulin ([place]) 10/25/2019  . Chronic anticoagulation (Coumadin) 10/25/2019  . Hyperlipidemia associated with type 2 diabetes mellitus ([place]) 10/25/2019  . CKD stage 3 due to type 2 diabetes mellitus ([place]) 10/25/2019  . GERD (gastroesophageal reflux disease) 10/25/2019  . Allergic rhinitis 10/25/2019  . Chronic constipation 10/25/2019  . Hypothyroidism   . Normocytic anemia 09/15/2019  . Wound of left groin 04/27/2019  . 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 Chronic venous insufficiency 11/21/2017  . Displacement of lumbar intervertebral disc without myelopathy 07/14/2017  . Radicular low back pain 05/25/2014  . Depression, major, recurrent ([place]) 06/17/2013  . OCD (obsessive compulsive disorder) 11/20/2012  . 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 Bipolar 1 disorder ([place]) 09/25/2012  . History of DVT (deep vein thrombosis) 08/18/2012    is allergic to aripiprazole, seroquel [quetiapine fumarate], chlorpromazine, and gabapentin.  MEDICAL HISTORY: Past Medical History:  Diagnosis Date  . ADD (attention deficit disorder)   . Anxiety   . Back pain   . Bilateral swelling of feet   .  Bipolar 1 disorder (HCC)   . Bipolar 1 disorder (HCC)   . Bipolar disorder (HCC)   . Chewing difficulty   . Chronic fatigue syndrome   . Chronic kidney disease    Stage 3 kidney disease;dx by Dr. Kari BaarsEdward  Hawkins.   . Constipation   . Depression   . Diabetes mellitus without complication (HCC)   . Dyspnea    with exertion  . GERD (gastroesophageal reflux disease)   . Headache    migraines  . High cholesterol   . History of blood clots   . History of DVT (deep vein thrombosis)    left leg  . Hypertension    states under control with meds., has been on med. x 2 years  . Hypothyroidism   . Joint pain   . Neuropathy   . Obsessive-compulsive disorder   . Peripheral vascular disease (HCC)   . Prediabetes   . Respiratory failure requiring intubation (HCC)   . Restless leg syndrome   . Shortness of breath   . Sleep apnea   . Swallowing difficulty   . Thrombocytopenia (HCC) 09/15/2019  . Trigger thumb of left hand 01/2018  . Trigger thumb of right hand     SURGICAL HISTORY: Past Surgical History:  Procedure Laterality Date  . ABDOMINAL HYSTERECTOMY  06/2016   complete  . APPLICATION OF WOUND VAC Left 04/27/2019   Procedure: APPLICATION OF WOUND VAC LEFT GROIN;  Surgeon: Maeola Harmanain, Brandon Christopher, MD;  [facility];  Service: Vascular;  Laterality: Left;  . AV FISTULA PLACEMENT Left 11/24/2018   Procedure: ARTERIOVENOUS (AV) FISTULA CREATION LEFT SFA TO LEFT FEMORAL VEIN;  Surgeon: Maeola Harmanain, Brandon Christopher, MD;  [facility];  Service: Vascular;  Laterality: Left;  . BACK SURGERY    . CHOLECYSTECTOMY    . COLONOSCOPY WITH PROPOFOL N/A 11/22/2019   Procedure: COLONOSCOPY WITH PROPOFOL;  Surgeon: Wyline MoodAnna, Kiran, MD;  [facility];  Service: Gastroenterology;  Laterality: N/A;  . FEMORAL ARTERY EXPLORATION  03/30/2019   Procedure: Left Common Femoral Artery and Vein Exploration;  Surgeon: Maeola Harmanain, Brandon Christopher, MD;  [facility];  Service: Vascular;;  . FEMORAL-FEMORAL BYPASS GRAFT Left 11/24/2018   Procedure: BYPASS GRAFT FEMORAL-FEMORAL VENOUS LEFT TO RIGHT PALMA PROCEDURE USING CRYOVEIN;  Surgeon: Maeola Harmanain, Brandon Christopher, MD;  [facility];  Service:  Vascular;  Laterality: Left;  . GROIN DEBRIDEMENT Left 04/27/2019   Procedure: GROIN DEBRIDEMENT;  Surgeon: Maeola Harmanain, Brandon Christopher, MD;  [facility];  Service: Vascular;  Laterality: Left;  . INSERTION OF ILIAC STENT  03/30/2019   Procedure: Stent of left common, external iliac veins and left common femoral vein;  Surgeon: Maeola Harmanain, Brandon Christopher, MD;  [facility];  Service: Vascular;;  . LOWER EXTREMITY VENOGRAPHY N/A 08/17/2018   Procedure: LOWER EXTREMITY VENOGRAPHY - Central Venogram;  Surgeon: Maeola Harmanain, Brandon Christopher, MD;  [facility];  Service: Cardiovascular;  Laterality: N/A;  . LOWER EXTREMITY VENOGRAPHY Bilateral 03/09/2019   Procedure: LOWER EXTREMITY VENOGRAPHY;  Surgeon: Maeola Harmanain, Brandon Christopher, MD;  [facility];  Service: Cardiovascular;  Laterality: Bilateral;  . LOWER EXTREMITY VENOGRAPHY Left 08/16/2019   Procedure: LOWER EXTREMITY VENOGRAPHY;  Surgeon: Maeola Harmanain, Brandon Christopher, MD;  [facility];  Service: Cardiovascular;  Laterality: Left;  . LUMBAR FUSION  11/21/2000   L5-S1  . LUMBAR SPINE SURGERY     x 2 others  . PATCH ANGIOPLASTY Left 03/30/2019   Procedure: Patch Angioplasty of the Left Common  Femoral Vein using Venosure Biologic patch;  Surgeon: Waynetta Sandy, MD;  Location: [place];  Service: Vascular;  Laterality: Left;  . PERIPHERAL VASCULAR INTERVENTION Left 08/16/2019   Procedure: PERIPHERAL VASCULAR INTERVENTION;  Surgeon: Waynetta Sandy, MD;  Location: [place] CV LAB;  Service: Cardiovascular;  Laterality: Left;  common femoral/femoral vein stent  . TRIGGER FINGER RELEASE Right 12/01/2017   Procedure: RELEASE TRIGGER FINGER/A-1 PULLEY RIGHT THUMB;  Surgeon: Leanora Cover, MD;  Location: [place];  Service: Orthopedics;  Laterality: Right;  . TRIGGER FINGER RELEASE Left 01/26/2018   Procedure: LEFT TRIGGER THUMB RELEASE;  Surgeon: Leanora Cover, MD;  Location:  [place];  Service: Orthopedics;  Laterality: Left;  . ULTRASOUND GUIDANCE FOR VASCULAR ACCESS Right 03/30/2019   Procedure: Ultrasound-guided cannulation right internal jugular vein;  Surgeon: Waynetta Sandy, MD;  Location: [place];  Service: Vascular;  Laterality: Right;    SOCIAL HISTORY: Social History   Socioeconomic History  . Marital status: Divorced    Spouse name: Not on file  . Number of children: 3  . Years of education: Not on file  . Highest education level: Not on file  Occupational History  . Not on file  Tobacco Use  . Smoking status: Never Smoker  . Smokeless tobacco: Never Used  Vaping Use  . Vaping Use: Never used  Substance and Sexual Activity  . Alcohol use: No  . Drug use: No  . Sexual activity: Not Currently    Partners: Male    Birth control/protection: Surgical  Other Topics Concern  . Not on file  Social History Narrative  . Not on file   Social Determinants of Health   Financial Resource Strain: Not on file  Food Insecurity: Not on file  Transportation Needs: Not on file  Physical Activity: Not on file  Stress: Not on file  Social Connections: Not on file  Intimate Partner Violence: Not on file    FAMILY HISTORY: Family History  Problem Relation Age of Onset  . Heart disease Mother   . Hyperlipidemia Mother   . Hypertension Mother   . Bipolar disorder Mother   . Stroke Mother   . Depression Mother   . Sleep apnea Mother   . Obesity Mother   . Diabetes Father   . Heart disease Father   . Hyperlipidemia Father   . Hypertension Father   . Sleep apnea Father   . Obesity Father   . Drug abuse Daughter   . ADD / ADHD Daughter   . Drug abuse Daughter   . Anxiety disorder Daughter   . Bipolar disorder Daughter   . Hypertension Sister   . Hypertension Brother   . Hyperlipidemia Brother   . Heart disease Brother   . Bipolar disorder Maternal Aunt   . Suicidality Maternal Aunt    REVIEW OF  SYSTEMS:  Review of Systems  Constitutional: Negative for chills, diaphoresis, fever, malaise/fatigue and weight loss.  HENT: Negative for congestion and sore throat.   Eyes: Negative.   Respiratory: Negative for cough, hemoptysis, sputum production, shortness of breath and wheezing.   Cardiovascular: Negative for chest pain, palpitations, orthopnea and claudication.  Gastrointestinal: Positive for constipation. Negative for abdominal pain, blood in stool, diarrhea, melena, nausea and vomiting.  Genitourinary: Positive for dysuria. Negative for frequency, hematuria and urgency.  Musculoskeletal: Negative for back pain, myalgias and neck pain.  Skin: Negative for itching and rash.  Neurological: Positive for tingling. Negative for dizziness, weakness  and headaches.  Endo/Heme/Allergies: Negative.   Psychiatric/Behavioral: Negative.      Vital signs: -Deferred due to telephone visit  PHYSICAL EXAM:  Physical Exam Constitutional:      General: She is not in acute distress.    Appearance: She is normal weight. She is not ill-appearing, toxic-appearing or diaphoretic.  HENT:     Head: Normocephalic and atraumatic.  Eyes:     General: No scleral icterus.       Right eye: No discharge.        Left eye: No discharge.     Conjunctiva/sclera: Conjunctivae normal.  Cardiovascular:     Rate and Rhythm: Normal rate and regular rhythm.     Heart sounds: No murmur heard. No friction rub. No gallop.   Pulmonary:     Effort: Pulmonary effort is normal. No respiratory distress.     Breath sounds: Normal breath sounds. No wheezing, rhonchi or rales.  Abdominal:     General: Bowel sounds are normal.  Musculoskeletal:     Right lower leg: No edema.     Left lower leg: No edema.  Skin:    General: Skin is warm and dry.  Neurological:     Coordination: Coordination normal.     Gait: Gait normal.      LABORATORY DATA:  CBC    Component Value Date/Time   WBC 6.3 10/17/2020 1020    RBC 4.67 10/17/2020 1020   HGB 14.4 10/17/2020 1020   HGB 13.2 03/29/2020 1250   HCT 44.0 10/17/2020 1020   HCT 39.6 03/29/2020 1250   PLT 132 (L) 10/17/2020 1020   PLT 142 (L) 03/29/2020 1250   MCV 94.2 10/17/2020 1020   MCV 94 03/29/2020 1250   MCH 30.8 10/17/2020 1020   MCHC 32.7 10/17/2020 1020   RDW 12.9 10/17/2020 1020   RDW 12.6 03/29/2020 1250   LYMPHSABS 1.5 10/17/2020 1020   LYMPHSABS 1.4 03/29/2020 1250   MONOABS 0.4 10/17/2020 1020   EOSABS 0.1 10/17/2020 1020   EOSABS 0.2 03/29/2020 1250   BASOSABS 0.0 10/17/2020 1020   BASOSABS 0.0 03/29/2020 1250    CMP     Component Value Date/Time   NA 137 10/17/2020 1020   NA 142 03/29/2020 1250   K 3.6 10/17/2020 1020   CL 100 10/17/2020 1020   CO2 28 10/17/2020 1020   GLUCOSE 137 (H) 10/17/2020 1020   BUN 39 (H) 10/17/2020 1020   BUN 24 03/29/2020 1250   CREATININE 1.34 (H) 10/17/2020 1020   CALCIUM 9.1 10/17/2020 1020   PROT 7.7 10/17/2020 1020   PROT 6.9 03/29/2020 1250   ALBUMIN 4.0 10/17/2020 1020   ALBUMIN 4.1 03/29/2020 1250   AST 22 10/17/2020 1020   ALT 22 10/17/2020 1020   ALKPHOS 103 10/17/2020 1020   BILITOT 0.5 10/17/2020 1020   BILITOT 0.3 03/29/2020 1250   GFRNONAA 47 (L) 10/17/2020 1020   GFRAA 53 (L) 07/18/2020 1434    All questions were answered to patient's stated satisfaction. Encouraged patient to call with any new concerns or questions before his next visit to the cancer center and we can certain see him sooner, if needed.      ASSESSMENT and THERAPY PLAN:  1) Iron Deficiency Anemia: -The patient's most recent labs returned with an iron of 69, TIBC of 312, and iron saturation of 22%.  A CBC returned with a hemoglobin of 14.4 and a hematocrit of 44. -Plans will be to see the patient back in 6 months  with labs prior to her return.  2) left knee pain: -The patient is pending left knee surgery for a torn meniscus on 11/14/2020.  3) history of a DVT: -The patient continues on Coumadin  with her INR this week returning at 2.3 by her report.  Harle Stanford, PA-C 10/24/2020

## 2020-10-24 NOTE — Telephone Encounter (Signed)
Call from patient's primary care provider's office  Dr Brita Romp ph# [phone], relaying patient is there now regarding clearance for surgery. Clinic staff for Dr Aline Brochure not available to speak with their office now - clearance letter is in patient's chart - faxed to  [phone]

## 2020-10-24 NOTE — Progress Notes (Signed)
Established patient visit   Patient: Jane [name]   DOB: 1966/01/26   54 y.o. Female  MRN: [id number] Visit Date: 10/24/2020  Today's healthcare provider: Lavon Paganini, MD   Chief Complaint  Patient presents with  . Pre-op Exam   Subjective    HPI   Surgical Clearance:   Left knee surgery  Pt is a 54 y.o. female who is here for preoperative clearance for Knee arthroscopy  1) High Risk Cardiac Conditions  1) Recent MI - No.  2) Decompensated Heart Failure - No.  3) Unstable angina - No.  4) Symptomatic arrythmia - No.  5) Sx Valvular Disease - No.  2) Intermediate Risk Factors - DM, CKD, CVA, CHF, CAD - Yes.    2) Functional Status - > 4 mets (Walk, run, climb stairs) Yes.  Rob Hickman Activity Status Index: 37.45 (Mets 7.34)  3) Surgery Specific Risk - Intermediate  4) Further Noninvasive evaluation -   1) EKG - Yes.     1) Hx of CVA, CAD, DM, CKD  2) Echo - No.   1) Worsening dyspnea   3) Stress Testing - Active Cardiac Disease - No.  5) Need for medical therapy - Beta Blocker, Statins indicated ? Yes.   - anticoag management      Patient Active Problem List   Diagnosis Date Noted  . Chronic pain syndrome 04/24/2020  . Pharmacologic therapy 04/24/2020  . Disorder of skeletal system 04/24/2020  . Problems influencing health status 04/24/2020  . History of thrombocytopenia 04/24/2020  . Abnormal MRI, cervical spine (01/08/2017) 04/24/2020  . Abnormal MRI, lumbar spine (05/11/2014) 04/24/2020  . DDD (degenerative disc disease), cervical 04/24/2020  . DDD (degenerative disc disease), lumbar 04/24/2020  . 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 Diabetic peripheral neuropathy ([place]) 04/24/2020  . Neurogenic pain 04/24/2020  . Chronic musculoskeletal pain 04/24/2020  . Other proteinuria 03/30/2020  . Myalgia 03/15/2020  . Bilateral hand swelling 02/16/2020  . Thrush 01/20/2020  . Morbid obesity ([place]) 10/25/2019  . History of back surgery  10/25/2019  . Peripheral neuropathy 10/25/2019  . Restless leg syndrome 10/25/2019  . Chronic low back pain (Bilateral) w/ sciatica (Bilateral) 10/25/2019  . Essential hypertension 10/25/2019  . Type 2 diabetes mellitus with stage 3 chronic kidney disease, without long-term current use of insulin ([place]) 10/25/2019  . Chronic anticoagulation (Coumadin) 10/25/2019  . Hyperlipidemia associated with type 2 diabetes mellitus ([place]) 10/25/2019  . CKD stage 3 due to type 2 diabetes mellitus ([place]) 10/25/2019  . GERD (gastroesophageal reflux disease) 10/25/2019  . Allergic rhinitis 10/25/2019  . Chronic constipation 10/25/2019  . Hypothyroidism   . Normocytic anemia 09/15/2019  . Wound of left groin 04/27/2019  . 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 Chronic venous insufficiency 11/21/2017  . Displacement of lumbar intervertebral disc without myelopathy 07/14/2017  . Radicular low back pain 05/25/2014  . Depression, major, recurrent ([place]) 06/17/2013  . OCD (obsessive compulsive disorder) 11/20/2012  . 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 Bipolar 1 disorder ([place]) 09/25/2012  . History of DVT (deep vein thrombosis) 08/18/2012   Past Medical History:  Diagnosis Date  . ADD (attention deficit disorder)   . Anxiety   . Back pain   . Bilateral swelling of feet   . Bipolar 1 disorder ([place])   . Bipolar 1 disorder ([place])   . Bipolar disorder ([place])   . Chewing difficulty   .  Chronic fatigue syndrome   . Chronic kidney disease    Stage 3 kidney disease;dx by Dr. Sinda Du.   . Constipation   . Depression   . Diabetes mellitus without complication ([place])   . Dyspnea    with exertion  . GERD (gastroesophageal reflux disease)   . Headache    migraines  . High cholesterol   . History of blood clots   . History of DVT (deep vein thrombosis)    left leg  . Hypertension    states under control with meds., has been on med. x 2 years  .  Hypothyroidism   . Joint pain   . Neuropathy   . Obsessive-compulsive disorder   . Peripheral vascular disease ([place])   . Prediabetes   . Respiratory failure requiring intubation ([place])   . Restless leg syndrome   . Shortness of breath   . Sleep apnea   . Swallowing difficulty   . Thrombocytopenia ([place]) 09/15/2019  . Trigger thumb of left hand 01/2018  . Trigger thumb of right hand    Social History   Tobacco Use  . Smoking status: Never Smoker  . Smokeless tobacco: Never Used  Vaping Use  . Vaping Use: Never used  Substance Use Topics  . Alcohol use: No  . Drug use: No   Allergies  Allergen Reactions  . Aripiprazole Other (See Comments)    BECOMES  VIOLENT   . Seroquel [Quetiapine Fumarate] Other (See Comments)    BECOMES VIOLENT  . Chlorpromazine Other (See Comments)    SEVERE ANXIETY   . Gabapentin Other (See Comments)    NIGHTMARES      Medications: Outpatient Medications Prior to Visit  Medication Sig  . ALPRAZolam (XANAX) 1 MG tablet Take 1 tablet (1 mg total) by mouth 2 (two) times daily as needed for anxiety.  Marland Kitchen amphetamine-dextroamphetamine (ADDERALL) 30 MG tablet Take 1 tablet by mouth 2 (two) times daily.  Marland Kitchen aspirin EC 81 MG tablet Take 81 mg by mouth daily.  Marland Kitchen atorvastatin (LIPITOR) 20 MG tablet Take 1 tablet (20 mg total) by mouth daily.  . cetirizine (ZYRTEC) 10 MG tablet TAKE 1 TABLET BY MOUTH DAILY  . ciprofloxacin (CIPRO) 500 MG tablet Take by mouth.  . cycloSPORINE (RESTASIS) 0.05 % ophthalmic emulsion Place 1 drop into both eyes 2 (two) times daily.  Marland Kitchen FLUoxetine (PROZAC) 40 MG capsule TAKE 1 TABLET BY MOUTH DAILY  . GARLIC PO Take [id number] mg by mouth.   . hydrochlorothiazide (HYDRODIURIL) 25 MG tablet Take 1 tablet (25 mg total) by mouth daily.  Marland Kitchen HYDROcodone-acetaminophen (NORCO) 10-325 MG tablet Take 1 tablet by mouth every 6 (six) hours as needed for severe pain. Must last 30 days.  Marland Kitchen levothyroxine (SYNTHROID) 125 MCG tablet Take 125 mcg by  mouth daily before breakfast.  . linaclotide (LINZESS) 145 MCG CAPS capsule Take 1 capsule (145 mcg total) by mouth daily.  . methocarbamol (ROBAXIN) 750 MG tablet Take 1 tablet (750 mg total) by mouth 2 (two) times daily as needed for muscle spasms.  . Multiple Vitamin (MULTIVITAMIN) capsule Take 1 capsule by mouth daily.  . Olopatadine HCl 0.2 % SOLN Apply 1 drop to eye daily.  . Omega-3 Fatty Acids (OMEGA-3 FISH OIL PO) Take 600 mg by mouth.  . pantoprazole (PROTONIX) 40 MG tablet Take 1 tablet (40 mg total) by mouth daily.  . potassium chloride (KLOR-CON) 10 MEQ tablet Take 1 tablet (10 mEq total) by mouth 3 (three) times daily.  Marland Kitchen  pregabalin (LYRICA) 100 MG capsule Take 100 mg by mouth 4 (four) times daily.  Marland Kitchen rOPINIRole (REQUIP) 2 MG tablet Take 1 tablet (2 mg total) by mouth 2 (two) times daily.  . Vitamin D, Ergocalciferol, (DRISDOL) 1.25 MG (50000 UNIT) CAPS capsule Take 1 capsule (50,000 Units total) by mouth every 7 (seven) days.  Marland Kitchen warfarin (COUMADIN) 5 MG tablet TAKE 1 AND 1/2 TABLETS BY MOUTH ON TUEDAYS AND THURSDAYS, THEN TAKE 1 TABLET DAILY ON ALL OTHER DAYS (Patient taking differently: 5 mg.)  . [DISCONTINUED] cephALEXin (KEFLEX) 500 MG capsule Take 500 mg by mouth 2 (two) times daily.  Marland Kitchen amphetamine-dextroamphetamine (ADDERALL) 30 MG tablet Take 1 tablet by mouth 2 (two) times daily.  Derrill Memo ON 11/11/2020] HYDROcodone-acetaminophen (NORCO) 10-325 MG tablet Take 1 tablet by mouth every 6 (six) hours as needed for severe pain. Must last 30 days.  Derrill Memo ON 12/11/2020] HYDROcodone-acetaminophen (NORCO) 10-325 MG tablet Take 1 tablet by mouth every 6 (six) hours as needed for severe pain. Must last 30 days.   No facility-administered medications prior to visit.    Review of Systems  Constitutional: Negative.   Eyes: Negative for pain.      Objective    BP (!) 147/82 (BP Location: Right Arm, Patient Position: Sitting, Cuff Size: Large)   Pulse (!) 54   Temp 97.6 F (36.4  C) (Oral)   Wt 265 lb (120.2 kg)   LMP  (LMP Unknown)   BMI 38.02 kg/m    Physical Exam Vitals reviewed.  Constitutional:      General: She is not in acute distress.    Appearance: Normal appearance. She is well-developed. She is not diaphoretic.  HENT:     Head: Normocephalic and atraumatic.  Eyes:     General: No scleral icterus.    Conjunctiva/sclera: Conjunctivae normal.  Neck:     Thyroid: No thyromegaly.  Cardiovascular:     Rate and Rhythm: Normal rate and regular rhythm.     Pulses: Normal pulses.     Heart sounds: Normal heart sounds. No murmur heard.   Pulmonary:     Effort: Pulmonary effort is normal. No respiratory distress.     Breath sounds: Normal breath sounds. No wheezing, rhonchi or rales.  Musculoskeletal:     Cervical back: Neck supple.     Right lower leg: No edema.     Left lower leg: No edema.  Lymphadenopathy:     Cervical: No cervical adenopathy.  Skin:    General: Skin is warm and dry.     Findings: No rash.  Neurological:     Mental Status: She is alert and oriented to person, place, and time. Mental status is at baseline.  Psychiatric:        Mood and Affect: Mood normal.        Behavior: Behavior normal.       Results for orders placed or performed in visit on 10/24/20  POCT glycosylated hemoglobin (Hb [name])  Result Value Ref Range   Hemoglobin [name] 5.8 (A) 4.0 - 5.6 %    Assessment & Plan     1. Pre-op evaluation 2. Type 2 diabetes mellitus with stage 3a chronic kidney disease, without long-term current use of insulin ([place])  I have independently evaluated patient.  Scotty Stohler is a 54 y.o. female who is high risk (from clotting standpoint, but cardiac optimized) for a intermediate risk surgery.  There are not modifiable risk factors (smoking, etc). Roma Bonillas's RCRI/NSQIP calculation for  MACE is: 0.9% (below average).  That being said, clotting risk is high.    - no indication for CXR - EKG shows sinus bradycardia  (rate is not actually 44 as it shows, but closer to 60) - [name] is well controlled at 5.8 (diet controlled) - no indication for Echo, stress test, or PFTs - reviewed anticoag - hold Coumadin x7days prior to surgery and use Lovenox bridge. Then will resume Coumadin post-op per Surgeon recommendation and continue bridge with Lovenox until INR is therapuetic - not on BB or ACE/ARB   No follow-ups on file.      I, Lavon Paganini, MD, have reviewed all documentation for this visit. The documentation on 10/24/20 for the exam, diagnosis, procedures, and orders are all accurate and complete.   Leng Montesdeoca, Dionne Bucy, MD, MPH [facility]

## 2020-10-24 NOTE — Telephone Encounter (Signed)
Have gotten letter for surgical approval, scanned into media  Called patient to schedule  Meniscectomy vs Meniscus repair left/ medial   Patients primary care is going to bridge with Lovenox, and hold Coumadin for 7 days   To you FYI surgery scheduled for Jan 11th

## 2020-10-25 ENCOUNTER — Ambulatory Visit (HOSPITAL_COMMUNITY): Payer: Medicaid Other | Admitting: Hematology

## 2020-10-25 ENCOUNTER — Ambulatory Visit (HOSPITAL_COMMUNITY): Payer: Medicaid Other

## 2020-10-25 ENCOUNTER — Inpatient Hospital Stay (HOSPITAL_BASED_OUTPATIENT_CLINIC_OR_DEPARTMENT_OTHER): Payer: Medicaid Other | Admitting: Medical

## 2020-10-25 VITALS — BP 147/77 | HR 66 | Temp 96.9°F | Wt 256.0 lb

## 2020-10-25 DIAGNOSIS — Z862 Personal history of diseases of the blood and blood-forming organs and certain disorders involving the immune mechanism: Secondary | ICD-10-CM

## 2020-10-25 DIAGNOSIS — M25562 Pain in left knee: Secondary | ICD-10-CM | POA: Diagnosis not present

## 2020-10-25 DIAGNOSIS — Z86718 Personal history of other venous thrombosis and embolism: Secondary | ICD-10-CM | POA: Diagnosis not present

## 2020-10-25 DIAGNOSIS — D509 Iron deficiency anemia, unspecified: Secondary | ICD-10-CM

## 2020-10-25 DIAGNOSIS — E1122 Type 2 diabetes mellitus with diabetic chronic kidney disease: Secondary | ICD-10-CM

## 2020-10-25 DIAGNOSIS — N183 Chronic kidney disease, stage 3 unspecified: Secondary | ICD-10-CM

## 2020-10-25 DIAGNOSIS — N390 Urinary tract infection, site not specified: Secondary | ICD-10-CM | POA: Diagnosis not present

## 2020-10-25 DIAGNOSIS — D649 Anemia, unspecified: Secondary | ICD-10-CM

## 2020-10-25 DIAGNOSIS — E039 Hypothyroidism, unspecified: Secondary | ICD-10-CM | POA: Diagnosis not present

## 2020-10-25 NOTE — Telephone Encounter (Signed)
I called her again, she was returning a call to schedule covid test and preop, told her that is through [facility] and gave her the number.

## 2020-10-25 NOTE — Telephone Encounter (Signed)
Pt called you back, can you please call her again?

## 2020-10-26 ENCOUNTER — Ambulatory Visit (HOSPITAL_COMMUNITY): Payer: Self-pay | Admitting: Hematology and Oncology

## 2020-10-26 DIAGNOSIS — E039 Hypothyroidism, unspecified: Secondary | ICD-10-CM | POA: Diagnosis not present

## 2020-10-27 DIAGNOSIS — E039 Hypothyroidism, unspecified: Secondary | ICD-10-CM | POA: Diagnosis not present

## 2020-10-29 DIAGNOSIS — E039 Hypothyroidism, unspecified: Secondary | ICD-10-CM | POA: Diagnosis not present

## 2020-10-30 DIAGNOSIS — E039 Hypothyroidism, unspecified: Secondary | ICD-10-CM | POA: Diagnosis not present

## 2020-10-31 DIAGNOSIS — E039 Hypothyroidism, unspecified: Secondary | ICD-10-CM | POA: Diagnosis not present

## 2020-11-01 DIAGNOSIS — E039 Hypothyroidism, unspecified: Secondary | ICD-10-CM | POA: Diagnosis not present

## 2020-11-02 DIAGNOSIS — E039 Hypothyroidism, unspecified: Secondary | ICD-10-CM | POA: Diagnosis not present

## 2020-11-04 DIAGNOSIS — E039 Hypothyroidism, unspecified: Secondary | ICD-10-CM | POA: Diagnosis not present

## 2020-11-05 DIAGNOSIS — E039 Hypothyroidism, unspecified: Secondary | ICD-10-CM | POA: Diagnosis not present

## 2020-11-05 DIAGNOSIS — H5213 Myopia, bilateral: Secondary | ICD-10-CM | POA: Diagnosis not present

## 2020-11-06 DIAGNOSIS — E039 Hypothyroidism, unspecified: Secondary | ICD-10-CM | POA: Diagnosis not present

## 2020-11-07 DIAGNOSIS — E039 Hypothyroidism, unspecified: Secondary | ICD-10-CM | POA: Diagnosis not present

## 2020-11-08 DIAGNOSIS — E039 Hypothyroidism, unspecified: Secondary | ICD-10-CM | POA: Diagnosis not present

## 2020-11-08 NOTE — Patient Instructions (Signed)
Jane [name]  11/08/2020     @PREFPERIOPPHARMACY @   Your procedure is scheduled on  11/14/2020.  Report to [facility] at  [phone]  A.M.  Call this number if you have problems the morning of surgery:  [phone]   Remember:  Do not eat or drink after midnight.                        Take these medicines the morning of surgery with A SIP OF WATER  Xanax, adderall, zyrtec, prozac, hydrocodone(if needed), levothyroxine, robaxin(if needed), protonix, lyrica, requip.    Do not wear jewelry, make-up or nail polish.  Do not wear lotions, powders, or perfumes, or deodorant. Please brush your teeth.  Do not shave 48 hours prior to surgery.  Men may shave face and neck.  Do not bring valuables to the hospital.  [facility] is not responsible for any belongings or valuables.  Contacts, dentures or bridgework may not be worn into surgery.  Leave your suitcase in the car.  After surgery it may be brought to your room.  For patients admitted to the hospital, discharge time will be determined by your treatment team.  Patients discharged the day of surgery will not be allowed to drive home.   Name and phone number of your driver:   family Special instructions:  DO NOT smoke the morning of your procedure.  Please read over the following fact sheets that you were given. Anesthesia Post-op Instructions and Care and Recovery After Surgery       Arthroscopic Knee Ligament Repair, Care After This sheet gives you information about how to care for yourself after your procedure. Your health care provider may also give you more specific instructions. If you have problems or questions, contact your health care provider. What can I expect after the procedure? After the procedure, it is common to have:  Pain in your knee.  Bruising and swelling on your knee, calf, and ankle for 3-4 days.  Fatigue. Follow these instructions at home: If you have a brace or immobilizer:  Wear the  brace or immobilizer as told by your health care provider. Remove it only as told by your health care provider.  Loosen the splint or immobilizer if your toes tingle, become numb, or turn cold and blue.  Keep the brace or immobilizer clean. Bathing  Do not take baths, swim, or use a hot tub until your health care provider approves. Ask your health care provider if you can take showers.  Keep your bandage (dressing) dry until your health care provider says that it can be removed. Cover it and your brace or immobilizer with a watertight covering when you take a shower. Incision care   Follow instructions from your health care provider about how to take care of your incision. Make sure you: ? Wash your hands with soap and water before you change your bandage (dressing). If soap and water are not available, use hand sanitizer. ? Change your dressing as told by your health care provider. ? Leave stitches (sutures), skin glue, or adhesive strips in place. These skin closures may need to stay in place for 2 weeks or longer. If adhesive strip edges start to loosen and curl up, you may trim the loose edges. Do not remove adhesive strips completely unless your health care provider tells you to do that.  Check your incision area every day for signs  of infection. Check for: ? More redness, swelling, or pain. ? More fluid or blood. ? Warmth. ? Pus or a bad smell. Managing pain, stiffness, and swelling   If directed, put ice on the affected area. ? If you have a removable brace or immobilizer, remove it as told by your health care provider. ? Put ice in a plastic bag. ? Place a towel between your skin and the bag or between your brace or immobilizer and the bag. ? Leave the ice on for 20 minutes, 2-3 times a day.  Move your toes often to avoid stiffness and to lessen swelling.  Raise (elevate) the injured area above the level of your heart while you are sitting or lying down. Driving  Do not  drive until your health care provider approves. If you have a brace or immobilizer on your leg, ask your health care provider when it is safe for you to drive.  Do not drive or use heavy machinery while taking prescription pain medicine. Activity  Rest as directed. Ask your health care provider what activities are safe for you.  Do physical therapy exercises as told by your health care provider. Physical therapy will help you regain strength and motion in your knee.  Follow instructions from your health care provider about: ? When you may start motion exercises. ? When you may start riding a stationary bike and doing other low-impact activities. ? When you may start to jog and do other high-impact activities. Safety  Do not use the injured limb to support your body weight until your health care provider says that you can. Use crutches as told by your health care provider. General instructions  Do not use any products that contain nicotine or tobacco, such as cigarettes and e-cigarettes. These can delay bone healing. If you need help quitting, ask your health care provider.  To prevent or treat constipation while you are taking prescription pain medicine, your health care provider may recommend that you: ? Drink enough fluid to keep your urine clear or pale yellow. ? Take over-the-counter or prescription medicines. ? Eat foods that are high in fiber, such as fresh fruits and vegetables, whole grains, and beans. ? Limit foods that are high in fat and processed sugars, such as fried and sweet foods.  Take over-the-counter and prescription medicines only as told by your health care provider.  Keep all follow-up visits as told by your health care provider. This is important. Contact a health care provider if:  You have more redness, swelling, or pain around an incision.  You have more fluid or blood coming from an incision.  Your incision feels warm to the touch.  You have a  fever.  You have pain or swelling in your knee, and it gets worse.  You have pain that does not get better with medicine. Get help right away if:  You have trouble breathing.  You have pus or a bad smell coming from an incision.  You have numbness and tingling near the knee joint. Summary  After the procedure, it is common to have knee pain with bruising and swelling on your knee, calf, and ankle.  Icing your knee and raising your leg above the level of your heart will help control the pain and the swelling.  Do physical therapy exercises as told by your health care provider. Physical therapy will help you regain strength and motion in your knee. This information is not intended to replace advice given to you by  your health care provider. Make sure you discuss any questions you have with your health care provider. Document Revised: 10/03/2017 Document Reviewed: 10/15/2016 Elsevier Patient Education  [place].  Spinal Anesthesia and Epidural Anesthesia, Care After This sheet gives you information about how to care for yourself after your procedure. Your doctor may also give you more specific instructions. If you have problems or questions, call your doctor. Follow these instructions at home: For at least 24 hours after the procedure:   Have a responsible adult stay with you. It is important to have someone help care for you until you are awake and alert.  Rest as needed.  Do not do activities where you could fall or get hurt (injured).  Do not drive.  Do not use heavy machinery.  Do not drink alcohol.  Do not take sleeping pills or medicines that make you sleepy (drowsy).  Do not make important decisions.  Do not sign legal documents.  Do not take care of children on your own. Eating and drinking  If you throw up (vomit), drink water, juice, or soup when nausea and vomiting stop.  Drink enough fluid to keep your pee (urine) pale yellow.  Make sure you do  not feel like throwing up (nauseous) before you eat solid foods.  Follow the diet that your doctor recommends. General instructions  Return to your normal activities as told by your doctor. Ask your doctor what activities are safe for you.  Take over-the-counter and prescription medicines only as told by your doctor.  If you have sleep apnea, surgery and certain medicines can raise your risk for breathing problems. Follow instructions from your doctor about when to wear your sleep device. Your doctor may tell you to wear your sleep device: ? Anytime you are sleeping, including during daytime naps. ? While taking prescription pain medicines, sleeping pills, or medicines that make you sleepy.  Do not use any products that contain nicotine or tobacco. This includes cigarettes and e-cigarettes. ? If you need help quitting, ask your doctor. ? If you smoke, do not smoke by yourself. Make sure someone is nearby in case you need help.  Keep all follow-up visits as told by your doctor. This is important. Contact a doctor if:  It has been more than one day since your procedure and you feel like throwing up.  It has been more than one day since your procedure and you throw up.  You have a rash. Get help right away if:  You have a fever.  You have a headache that lasts a long time.  You have a very bad headache.  Your vision is blurry.  You see two of a single object (double vision).  You are dizzy or light-headed.  You faint.  Your arms or legs tingle, feel weak, or get numb.  You have trouble breathing.  You cannot pee (urinate). Summary  After the procedure, have a responsible adult stay with you at home until you are fully awake and alert.  Do not do activities that might get you injured. Do not drive, use heavy machinery, drink alcohol, or make important decisions for 24 hours after the procedure.  Take medicines as told by your doctor. Do not use products that contain  nicotine or tobacco.  Get help right away if you have a fever, blurry vision, difficulty breathing or passing urine, or weakness or numbness in arms or legs. This information is not intended to replace advice given to you by  your health care provider. Make sure you discuss any questions you have with your health care provider. Document Revised: 10/03/2017 Document Reviewed: 02/12/2016 Elsevier Patient Education  2020 [employer]  General Anesthesia, Adult, Care After This sheet gives you information about how to care for yourself after your procedure. Your health care provider may also give you more specific instructions. If you have problems or questions, contact your health care provider. What can I expect after the procedure? After the procedure, the following side effects are common:  Pain or discomfort at the IV site.  Nausea.  Vomiting.  Sore throat.  Trouble concentrating.  Feeling cold or chills.  Weak or tired.  Sleepiness and fatigue.  Soreness and body aches. These side effects can affect parts of the body that were not involved in surgery. Follow these instructions at home:  For at least 24 hours after the procedure:  Have a responsible adult stay with you. It is important to have someone help care for you until you are awake and alert.  Rest as needed.  Do not: ? Participate in activities in which you could fall or become injured. ? Drive. ? Use heavy machinery. ? Drink alcohol. ? Take sleeping pills or medicines that cause drowsiness. ? Make important decisions or sign legal documents. ? Take care of children on your own. Eating and drinking  Follow any instructions from your health care provider about eating or drinking restrictions.  When you feel hungry, start by eating small amounts of foods that are soft and easy to digest (bland), such as toast. Gradually return to your regular diet.  Drink enough fluid to keep your urine pale yellow.  If  you vomit, rehydrate by drinking water, juice, or clear broth. General instructions  If you have sleep apnea, surgery and certain medicines can increase your risk for breathing problems. Follow instructions from your health care provider about wearing your sleep device: ? Anytime you are sleeping, including during daytime naps. ? While taking prescription pain medicines, sleeping medicines, or medicines that make you drowsy.  Return to your normal activities as told by your health care provider. Ask your health care provider what activities are safe for you.  Take over-the-counter and prescription medicines only as told by your health care provider.  If you smoke, do not smoke without supervision.  Keep all follow-up visits as told by your health care provider. This is important. Contact a health care provider if:  You have nausea or vomiting that does not get better with medicine.  You cannot eat or drink without vomiting.  You have pain that does not get better with medicine.  You are unable to pass urine.  You develop a skin rash.  You have a fever.  You have redness around your IV site that gets worse. Get help right away if:  You have difficulty breathing.  You have chest pain.  You have blood in your urine or stool, or you vomit blood. Summary  After the procedure, it is common to have a sore throat or nausea. It is also common to feel tired.  Have a responsible adult stay with you for the first 24 hours after general anesthesia. It is important to have someone help care for you until you are awake and alert.  When you feel hungry, start by eating small amounts of foods that are soft and easy to digest (bland), such as toast. Gradually return to your regular diet.  Drink enough fluid to keep your  urine pale yellow.  Return to your normal activities as told by your health care provider. Ask your health care provider what activities are safe for you. This  information is not intended to replace advice given to you by your health care provider. Make sure you discuss any questions you have with your health care provider. Document Revised: 10/24/2017 Document Reviewed: 06/06/2017 Elsevier Patient Education  [place]. How to Use Chlorhexidine for Bathing Chlorhexidine gluconate (CHG) is a germ-killing (antiseptic) solution that is used to clean the skin. It can get rid of the bacteria that normally live on the skin and can keep them away for about 24 hours. To clean your skin with CHG, you may be given:  A CHG solution to use in the shower or as part of a sponge bath.  A prepackaged cloth that contains CHG. Cleaning your skin with CHG may help lower the risk for infection:  While you are staying in the intensive care unit of the hospital.  If you have a vascular access, such as a central line, to provide short-term or long-term access to your veins.  If you have a catheter to drain urine from your bladder.  If you are on a ventilator. A ventilator is a machine that helps you breathe by moving air in and out of your lungs.  After surgery. What are the risks? Risks of using CHG include:  A skin reaction.  Hearing loss, if CHG gets in your ears.  Eye injury, if CHG gets in your eyes and is not rinsed out.  The CHG product catching fire. Make sure that you avoid smoking and flames after applying CHG to your skin. Do not use CHG:  If you have a chlorhexidine allergy or have previously reacted to chlorhexidine.  On babies younger than 7 months of age. How to use CHG solution  Use CHG only as told by your health care provider, and follow the instructions on the label.  Use the full amount of CHG as directed. Usually, this is one bottle. During a shower Follow these steps when using CHG solution during a shower (unless your health care provider gives you different instructions): 1. Start the shower. 2. Use your normal soap and  shampoo to wash your face and hair. 3. Turn off the shower or move out of the shower stream. 4. Pour the CHG onto a clean washcloth. Do not use any type of brush or rough-edged sponge. 5. Starting at your neck, lather your body down to your toes. Make sure you follow these instructions: ? If you will be having surgery, pay special attention to the part of your body where you will be having surgery. Scrub this area for at least 1 minute. ? Do not use CHG on your head or face. If the solution gets into your ears or eyes, rinse them well with water. ? Avoid your genital area. ? Avoid any areas of skin that have broken skin, cuts, or scrapes. ? Scrub your back and under your arms. Make sure to wash skin folds. 6. Let the lather sit on your skin for 1-2 minutes or as long as told by your health care provider. 7. Thoroughly rinse your entire body in the shower. Make sure that all body creases and crevices are rinsed well. 8. Dry off with a clean towel. Do not put any substances on your body afterward-such as powder, lotion, or perfume-unless you are told to do so by your health care provider. Only use  lotions that are recommended by the manufacturer. 9. Put on clean clothes or pajamas. 10. If it is the night before your surgery, sleep in clean sheets.  During a sponge bath Follow these steps when using CHG solution during a sponge bath (unless your health care provider gives you different instructions): 1. Use your normal soap and shampoo to wash your face and hair. 2. Pour the CHG onto a clean washcloth. 3. Starting at your neck, lather your body down to your toes. Make sure you follow these instructions: ? If you will be having surgery, pay special attention to the part of your body where you will be having surgery. Scrub this area for at least 1 minute. ? Do not use CHG on your head or face. If the solution gets into your ears or eyes, rinse them well with water. ? Avoid your genital  area. ? Avoid any areas of skin that have broken skin, cuts, or scrapes. ? Scrub your back and under your arms. Make sure to wash skin folds. 4. Let the lather sit on your skin for 1-2 minutes or as long as told by your health care provider. 5. Using a different clean, wet washcloth, thoroughly rinse your entire body. Make sure that all body creases and crevices are rinsed well. 6. Dry off with a clean towel. Do not put any substances on your body afterward-such as powder, lotion, or perfume-unless you are told to do so by your health care provider. Only use lotions that are recommended by the manufacturer. 7. Put on clean clothes or pajamas. 8. If it is the night before your surgery, sleep in clean sheets. How to use CHG prepackaged cloths  Only use CHG cloths as told by your health care provider, and follow the instructions on the label.  Use the CHG cloth on clean, dry skin.  Do not use the CHG cloth on your head or face unless your health care provider tells you to.  When washing with the CHG cloth: ? Avoid your genital area. ? Avoid any areas of skin that have broken skin, cuts, or scrapes. Before surgery Follow these steps when using a CHG cloth to clean before surgery (unless your health care provider gives you different instructions): 1. Using the CHG cloth, vigorously scrub the part of your body where you will be having surgery. Scrub using a back-and-forth motion for 3 minutes. The area on your body should be completely wet with CHG when you are done scrubbing. 2. Do not rinse. Discard the cloth and let the area air-dry. Do not put any substances on the area afterward, such as powder, lotion, or perfume. 3. Put on clean clothes or pajamas. 4. If it is the night before your surgery, sleep in clean sheets.  For general bathing Follow these steps when using CHG cloths for general bathing (unless your health care provider gives you different instructions). 1. Use a separate CHG  cloth for each area of your body. Make sure you wash between any folds of skin and between your fingers and toes. Wash your body in the following order, switching to a new cloth after each step: ? The front of your neck, shoulders, and chest. ? Both of your arms, under your arms, and your hands. ? Your stomach and groin area, avoiding the genitals. ? Your right leg and foot. ? Your left leg and foot. ? The back of your neck, your back, and your buttocks. 2. Do not rinse. Discard the cloth and  let the area air-dry. Do not put any substances on your body afterward-such as powder, lotion, or perfume-unless you are told to do so by your health care provider. Only use lotions that are recommended by the manufacturer. 3. Put on clean clothes or pajamas. Contact a health care provider if:  Your skin gets irritated after scrubbing.  You have questions about using your solution or cloth. Get help right away if:  Your eyes become very red or swollen.  Your eyes itch badly.  Your skin itches badly and is red or swollen.  Your hearing changes.  You have trouble seeing.  You have swelling or tingling in your mouth or throat.  You have trouble breathing.  You swallow any chlorhexidine. Summary  Chlorhexidine gluconate (CHG) is a germ-killing (antiseptic) solution that is used to clean the skin. Cleaning your skin with CHG may help to lower your risk for infection.  You may be given CHG to use for bathing. It may be in a bottle or in a prepackaged cloth to use on your skin. Carefully follow your health care provider's instructions and the instructions on the product label.  Do not use CHG if you have a chlorhexidine allergy.  Contact your health care provider if your skin gets irritated after scrubbing. This information is not intended to replace advice given to you by your health care provider. Make sure you discuss any questions you have with your health care provider. Document Revised:  01/07/2019 Document Reviewed: 09/18/2017 Elsevier Patient Education  [place].

## 2020-11-09 ENCOUNTER — Encounter (INDEPENDENT_AMBULATORY_CARE_PROVIDER_SITE_OTHER): Payer: Self-pay | Admitting: Family Medicine

## 2020-11-09 ENCOUNTER — Ambulatory Visit (INDEPENDENT_AMBULATORY_CARE_PROVIDER_SITE_OTHER): Payer: Medicaid Other | Admitting: Family Medicine

## 2020-11-09 ENCOUNTER — Telehealth: Payer: Self-pay

## 2020-11-09 ENCOUNTER — Other Ambulatory Visit: Payer: Self-pay

## 2020-11-09 ENCOUNTER — Ambulatory Visit: Payer: Self-pay | Admitting: Family Medicine

## 2020-11-09 VITALS — BP 152/83 | HR 64 | Temp 98.3°F | Ht 70.0 in | Wt 263.0 lb

## 2020-11-09 DIAGNOSIS — E039 Hypothyroidism, unspecified: Secondary | ICD-10-CM | POA: Diagnosis not present

## 2020-11-09 DIAGNOSIS — M7551 Bursitis of right shoulder: Secondary | ICD-10-CM | POA: Insufficient documentation

## 2020-11-09 DIAGNOSIS — E785 Hyperlipidemia, unspecified: Secondary | ICD-10-CM | POA: Diagnosis not present

## 2020-11-09 DIAGNOSIS — M1712 Unilateral primary osteoarthritis, left knee: Secondary | ICD-10-CM | POA: Diagnosis not present

## 2020-11-09 DIAGNOSIS — M7989 Other specified soft tissue disorders: Secondary | ICD-10-CM | POA: Diagnosis not present

## 2020-11-09 DIAGNOSIS — M19072 Primary osteoarthritis, left ankle and foot: Secondary | ICD-10-CM | POA: Diagnosis not present

## 2020-11-09 DIAGNOSIS — Z6837 Body mass index (BMI) 37.0-37.9, adult: Secondary | ICD-10-CM

## 2020-11-09 DIAGNOSIS — E559 Vitamin D deficiency, unspecified: Secondary | ICD-10-CM | POA: Diagnosis not present

## 2020-11-09 DIAGNOSIS — I10 Essential (primary) hypertension: Secondary | ICD-10-CM | POA: Diagnosis not present

## 2020-11-09 DIAGNOSIS — M791 Myalgia, unspecified site: Secondary | ICD-10-CM | POA: Diagnosis not present

## 2020-11-09 DIAGNOSIS — M4722 Other spondylosis with radiculopathy, cervical region: Secondary | ICD-10-CM | POA: Diagnosis not present

## 2020-11-09 DIAGNOSIS — R600 Localized edema: Secondary | ICD-10-CM | POA: Diagnosis not present

## 2020-11-09 DIAGNOSIS — S9032XA Contusion of left foot, initial encounter: Secondary | ICD-10-CM | POA: Diagnosis not present

## 2020-11-09 DIAGNOSIS — Z9889 Other specified postprocedural states: Secondary | ICD-10-CM | POA: Diagnosis not present

## 2020-11-09 DIAGNOSIS — Z90710 Acquired absence of both cervix and uterus: Secondary | ICD-10-CM | POA: Diagnosis not present

## 2020-11-09 MED ORDER — VITAMIN D (ERGOCALCIFEROL) 1.25 MG (50000 UNIT) PO CAPS: 50000.0000 [IU] | ORAL_CAPSULE | ORAL | 0 refills | Status: DC

## 2020-11-09 NOTE — Telephone Encounter (Signed)
Copied from CRM [id number]. Topic: General - Other >> Nov 09, 2020 10:54 AM Lyn Hollingshead D wrote: PT need to reschedule her COU[facility] appt / please advise

## 2020-11-10 ENCOUNTER — Encounter (HOSPITAL_COMMUNITY)
Admission: RE | Admit: 2020-11-10 | Discharge: 2020-11-10 | Disposition: A | Discharge status: Home / Self Care | Payer: Medicaid Other | Source: Ambulatory Visit | Attending: Orthopedic Surgery | Admitting: Orthopedic Surgery

## 2020-11-10 ENCOUNTER — Other Ambulatory Visit (HOSPITAL_COMMUNITY)
Admission: RE | Admit: 2020-11-10 | Discharge: 2020-11-10 | Disposition: A | Discharge status: Home / Self Care | Payer: Medicaid Other | Source: Ambulatory Visit | Attending: Orthopedic Surgery | Admitting: Orthopedic Surgery

## 2020-11-10 ENCOUNTER — Other Ambulatory Visit: Payer: Self-pay

## 2020-11-10 ENCOUNTER — Encounter: Payer: Self-pay | Admitting: Vascular Surgery

## 2020-11-10 ENCOUNTER — Ambulatory Visit (INDEPENDENT_AMBULATORY_CARE_PROVIDER_SITE_OTHER): Payer: Medicaid Other | Admitting: Vascular Surgery

## 2020-11-10 ENCOUNTER — Encounter (HOSPITAL_COMMUNITY): Payer: Self-pay

## 2020-11-10 ENCOUNTER — Ambulatory Visit (HOSPITAL_COMMUNITY)
Admission: RE | Admit: 2020-11-10 | Discharge: 2020-11-10 | Disposition: A | Discharge status: Home / Self Care | Payer: Medicaid Other | Source: Ambulatory Visit | Attending: Vascular Surgery | Admitting: Vascular Surgery

## 2020-11-10 VITALS — BP 152/84 | HR 53 | Temp 98.2°F | Resp 20 | Ht 70.0 in | Wt 266.0 lb

## 2020-11-10 DIAGNOSIS — Z20822 Contact with and (suspected) exposure to covid-19: Secondary | ICD-10-CM | POA: Insufficient documentation

## 2020-11-10 DIAGNOSIS — E039 Hypothyroidism, unspecified: Secondary | ICD-10-CM | POA: Diagnosis not present

## 2020-11-10 DIAGNOSIS — I87002 Postthrombotic syndrome without complications of left lower extremity: Secondary | ICD-10-CM | POA: Diagnosis not present

## 2020-11-10 DIAGNOSIS — I871 Compression of vein: Secondary | ICD-10-CM

## 2020-11-10 DIAGNOSIS — Z01818 Encounter for other preprocedural examination: Secondary | ICD-10-CM | POA: Insufficient documentation

## 2020-11-10 LAB — CBC WITH DIFFERENTIAL/PLATELET
Abs Immature Granulocytes: 0.04 10*3/uL (ref 0.00–0.07)
Basophils Absolute: 0 10*3/uL (ref 0.0–0.1)
Basophils Relative: 1 %
Eosinophils Absolute: 0.1 10*3/uL (ref 0.0–0.5)
Eosinophils Relative: 3 %
HCT: 40.8 % (ref 36.0–46.0)
Hemoglobin: 13.5 g/dL (ref 12.0–15.0)
Immature Granulocytes: 1 %
Lymphocytes Relative: 25 %
Lymphs Abs: 1.4 10*3/uL (ref 0.7–4.0)
MCH: 31 pg (ref 26.0–34.0)
MCHC: 33.1 g/dL (ref 30.0–36.0)
MCV: 93.8 fL (ref 80.0–100.0)
Monocytes Absolute: 0.4 10*3/uL (ref 0.1–1.0)
Monocytes Relative: 7 %
Neutro Abs: 3.6 10*3/uL (ref 1.7–7.7)
Neutrophils Relative %: 63 %
Platelets: 137 10*3/uL — ABNORMAL LOW (ref 150–400)
RBC: 4.35 MIL/uL (ref 3.87–5.11)
RDW: 12.9 % (ref 11.5–15.5)
WBC: 5.7 10*3/uL (ref 4.0–10.5)
nRBC: 0 % (ref 0.0–0.2)

## 2020-11-10 LAB — BASIC METABOLIC PANEL
Anion gap: 10 (ref 5–15)
BUN: 26 mg/dL — ABNORMAL HIGH (ref 6–20)
CO2: 27 mmol/L (ref 22–32)
Calcium: 9.1 mg/dL (ref 8.9–10.3)
Chloride: 101 mmol/L (ref 98–111)
Creatinine, Ser: 1.19 mg/dL — ABNORMAL HIGH (ref 0.44–1.00)
GFR, Estimated: 54 mL/min — ABNORMAL LOW (ref >60)
Glucose, Bld: 142 mg/dL — ABNORMAL HIGH (ref 70–99)
Potassium: 3.6 mmol/L (ref 3.5–5.1)
Sodium: 138 mmol/L (ref 135–145)

## 2020-11-10 NOTE — Progress Notes (Signed)
Patient ID: Jane [name], female   DOB: 1966/10/01, 55 y.o.   MRN: [id number]  Reason for Consult: Follow-up   Referred by [name], MD  Subjective:     HPI:  Jane [name] is a 55 y.o. female multiple previous left lower extremity vein interventions.  These performed for recurrent left lower extremity DVT and swelling.  She most recently underwent balloon angioplasty of left common and external iliac veins and stenting to extend down to her common femoral vein.  She has a history of an open femorofemoral procedure that failed with a persistent AV fistula on the left.  She does not like wearing compression stockings and has been noncompliant with this.  She is typically on Coumadin currently being bridged with Lovenox for left meniscal surgery.  She does have worsening swelling of the left lower extremity does have some bruising of the left foot without any known trauma.  Right leg is without symptoms.  Past Medical History:  Diagnosis Date  . ADD (attention deficit disorder)   . Anxiety   . Back pain   . Bilateral swelling of feet   . Bipolar 1 disorder ([place])   . Bipolar 1 disorder ([place])   . Bipolar disorder ([place])   . Chewing difficulty   . Chronic fatigue syndrome   . Chronic kidney disease    Stage 3 kidney disease;dx by Dr. Sinda Du.   . Constipation   . Depression   . Diabetes mellitus without complication (HCC)    diet controlled  . Dyspnea    with exertion  . GERD (gastroesophageal reflux disease)   . Headache    migraines  . High cholesterol   . History of blood clots   . History of DVT (deep vein thrombosis)    left leg  . Hypertension    states under control with meds., has been on med. x 2 years  . Hypothyroidism   . Joint pain   . Neuropathy   . Obsessive-compulsive disorder   . Peripheral vascular disease ([place])   . Prediabetes   . Respiratory failure requiring intubation ([place])   . Restless leg syndrome   . Shortness of breath   .  Sleep apnea   . Thrombocytopenia ([place]) 09/15/2019  . Trigger thumb of left hand 01/2018  . Trigger thumb of right hand    Family History  Problem Relation Age of Onset  . Heart disease Mother   . Hyperlipidemia Mother   . Hypertension Mother   . Bipolar disorder Mother   . Stroke Mother   . Depression Mother   . Sleep apnea Mother   . Obesity Mother   . Diabetes Father   . Heart disease Father   . Hyperlipidemia Father   . Hypertension Father   . Sleep apnea Father   . Obesity Father   . Drug abuse Daughter   . ADD / ADHD Daughter   . Drug abuse Daughter   . Anxiety disorder Daughter   . Bipolar disorder Daughter   . Hypertension Sister   . Hypertension Brother   . Hyperlipidemia Brother   . Heart disease Brother   . Bipolar disorder Maternal Aunt   . Suicidality Maternal Aunt    Past Surgical History:  Procedure Laterality Date  . ABDOMINAL HYSTERECTOMY  06/2016   complete  . APPLICATION OF WOUND VAC Left 04/27/2019   Procedure: APPLICATION OF WOUND VAC LEFT GROIN;  Surgeon: Waynetta Sandy, MD;  Location: [place];  Service:  Vascular;  Laterality: Left;  . AV FISTULA PLACEMENT Left 11/24/2018   Procedure: ARTERIOVENOUS (AV) FISTULA CREATION LEFT SFA TO LEFT FEMORAL VEIN;  Surgeon: Waynetta Sandy, MD;  Location: [place];  Service: Vascular;  Laterality: Left;  . BACK SURGERY    . CHOLECYSTECTOMY    . COLONOSCOPY WITH PROPOFOL N/A 11/22/2019   Procedure: COLONOSCOPY WITH PROPOFOL;  Surgeon: [name], MD;  Location: [facility] ENDOSCOPY;  Service: Gastroenterology;  Laterality: N/A;  . FEMORAL ARTERY EXPLORATION  03/30/2019   Procedure: Left Common Femoral Artery and Vein Exploration;  Surgeon: Waynetta Sandy, MD;  Location: [place];  Service: Vascular;;  . FEMORAL-FEMORAL BYPASS GRAFT Left 11/24/2018   Procedure: BYPASS GRAFT FEMORAL-FEMORAL VENOUS LEFT TO RIGHT PALMA PROCEDURE USING CRYOVEIN;  Surgeon: Waynetta Sandy, MD;  Location: [place];   Service: Vascular;  Laterality: Left;  . GROIN DEBRIDEMENT Left 04/27/2019   Procedure: GROIN DEBRIDEMENT;  Surgeon: Waynetta Sandy, MD;  Location: [place];  Service: Vascular;  Laterality: Left;  . INSERTION OF ILIAC STENT  03/30/2019   Procedure: Stent of left common, external iliac veins and left common femoral vein;  Surgeon: Waynetta Sandy, MD;  Location: [place];  Service: Vascular;;  . LOWER EXTREMITY VENOGRAPHY N/A 08/17/2018   Procedure: LOWER EXTREMITY VENOGRAPHY - Central Venogram;  Surgeon: Waynetta Sandy, MD;  Location: [place] CV LAB;  Service: Cardiovascular;  Laterality: N/A;  . LOWER EXTREMITY VENOGRAPHY Bilateral 03/09/2019   Procedure: LOWER EXTREMITY VENOGRAPHY;  Surgeon: Waynetta Sandy, MD;  Location: [place] CV LAB;  Service: Cardiovascular;  Laterality: Bilateral;  . LOWER EXTREMITY VENOGRAPHY Left 08/16/2019   Procedure: LOWER EXTREMITY VENOGRAPHY;  Surgeon: Waynetta Sandy, MD;  Location: [place] CV LAB;  Service: Cardiovascular;  Laterality: Left;  . LUMBAR FUSION  11/21/2000   L5-S1  . LUMBAR SPINE SURGERY     x 2 others  . PATCH ANGIOPLASTY Left 03/30/2019   Procedure: Patch Angioplasty of the Left Common Femoral Vein using Venosure Biologic patch;  Surgeon: Waynetta Sandy, MD;  Location: [place];  Service: Vascular;  Laterality: Left;  . PERIPHERAL VASCULAR INTERVENTION Left 08/16/2019   Procedure: PERIPHERAL VASCULAR INTERVENTION;  Surgeon: Waynetta Sandy, MD;  Location: [place] CV LAB;  Service: Cardiovascular;  Laterality: Left;  common femoral/femoral vein stent  . TRIGGER FINGER RELEASE Right 12/01/2017   Procedure: RELEASE TRIGGER FINGER/A-1 PULLEY RIGHT THUMB;  Surgeon: Leanora Cover, MD;  Location: [place];  Service: Orthopedics;  Laterality: Right;  . TRIGGER FINGER RELEASE Left 01/26/2018   Procedure: LEFT TRIGGER THUMB RELEASE;  Surgeon: Leanora Cover, MD;   Location: [place];  Service: Orthopedics;  Laterality: Left;  . ULTRASOUND GUIDANCE FOR VASCULAR ACCESS Right 03/30/2019   Procedure: Ultrasound-guided cannulation right internal jugular vein;  Surgeon: Waynetta Sandy, MD;  Location: [place];  Service: Vascular;  Laterality: Right;    Short Social History:  Social History   Tobacco Use  . Smoking status: Never Smoker  . Smokeless tobacco: Never Used  Substance Use Topics  . Alcohol use: No    Allergies  Allergen Reactions  . Aripiprazole Other (See Comments)    BECOMES  VIOLENT   . Seroquel [Quetiapine Fumarate] Other (See Comments)    BECOMES VIOLENT  . Chlorpromazine Other (See Comments)    SEVERE ANXIETY   . Gabapentin Other (See Comments)    NIGHTMARES     Current Outpatient Medications  Medication Sig Dispense Refill  .  ALPRAZolam (XANAX) 1 MG tablet Take 1 tablet (1 mg total) by mouth 2 (two) times daily as needed for anxiety. 60 tablet 2  . amphetamine-dextroamphetamine (ADDERALL) 30 MG tablet Take 1 tablet by mouth 2 (two) times daily. 60 tablet 0  . aspirin EC 81 MG tablet Take 81 mg by mouth daily.    Marland Kitchen atorvastatin (LIPITOR) 20 MG tablet Take 1 tablet (20 mg total) by mouth daily. 90 tablet 3  . cetirizine (ZYRTEC) 10 MG tablet TAKE 1 TABLET BY MOUTH DAILY (Patient taking differently: Take 10 mg by mouth daily.) 90 tablet 1  . cycloSPORINE (RESTASIS) 0.05 % ophthalmic emulsion Place 1 drop into both eyes 2 (two) times daily.    Marland Kitchen enoxaparin (LOVENOX) 120 MG/0.8ML injection Inject 0.8 mLs (120 mg total) into the skin every 12 (twelve) hours. 7 days prior to surgery and until INR is therapeutic post-op 33.6 mL 0  . FLUoxetine (PROZAC) 40 MG capsule TAKE 1 TABLET BY MOUTH DAILY (Patient taking differently: Take 40 mg by mouth daily. TAKE 1 TABLET BY MOUTH DAILY) 90 capsule 2  . GARLIC PO Take 9,518 mg by mouth daily.    . hydrochlorothiazide (HYDRODIURIL) 25 MG tablet Take 1 tablet (25 mg  total) by mouth daily. 30 tablet 0  . HYDROcodone-acetaminophen (NORCO) 10-325 MG tablet Take 1 tablet by mouth every 6 (six) hours as needed for severe pain. Must last 30 days. 120 tablet 0  . [START ON 11/11/2020] HYDROcodone-acetaminophen (NORCO) 10-325 MG tablet Take 1 tablet by mouth every 6 (six) hours as needed for severe pain. Must last 30 days. 120 tablet 0  . [START ON 12/11/2020] HYDROcodone-acetaminophen (NORCO) 10-325 MG tablet Take 1 tablet by mouth every 6 (six) hours as needed for severe pain. Must last 30 days. 120 tablet 0  . levothyroxine (SYNTHROID) 125 MCG tablet Take 125 mcg by mouth daily before breakfast.    . linaclotide (LINZESS) 145 MCG CAPS capsule Take 1 capsule (145 mcg total) by mouth daily. 90 capsule 3  . methocarbamol (ROBAXIN) 750 MG tablet Take 1 tablet (750 mg total) by mouth 2 (two) times daily as needed for muscle spasms. 60 tablet 2  . Multiple Vitamin (MULTIVITAMIN) capsule Take 1 capsule by mouth daily.    . Olopatadine HCl 0.2 % SOLN Place 1 drop into both eyes daily.    . Omega-3 Fatty Acids (OMEGA-3 FISH OIL PO) Take 600 mg by mouth daily.    . pantoprazole (PROTONIX) 40 MG tablet Take 1 tablet (40 mg total) by mouth daily. 90 tablet 3  . potassium chloride (KLOR-CON) 10 MEQ tablet Take 1 tablet (10 mEq total) by mouth 3 (three) times daily. 270 tablet 3  . pregabalin (LYRICA) 100 MG capsule Take 100 mg by mouth 4 (four) times daily.    Marland Kitchen rOPINIRole (REQUIP) 2 MG tablet Take 1 tablet (2 mg total) by mouth 2 (two) times daily. 180 tablet 3  . Vitamin D, Ergocalciferol, (DRISDOL) 1.25 MG (50000 UNIT) CAPS capsule Take 1 capsule (50,000 Units total) by mouth every 7 (seven) days. 4 capsule 0  . warfarin (COUMADIN) 5 MG tablet TAKE 1 AND 1/2 TABLETS BY MOUTH ON TUEDAYS AND THURSDAYS, THEN TAKE 1 TABLET DAILY ON ALL OTHER DAYS (Patient taking differently: Take 5 mg by mouth See admin instructions. Mon and Fri 2.5 mg, all other days 5 mg) 60 tablet 1   No current  facility-administered medications for this visit.    Review of Systems  Constitutional:  Constitutional  negative. HENT: HENT negative.  Eyes: Eyes negative.  Respiratory: Respiratory negative.  Cardiovascular: Positive for leg swelling.  GI: Gastrointestinal negative.  Musculoskeletal: Positive for gait problem, leg pain and joint pain.  Hematologic: Hematologic/lymphatic negative.  Psychiatric: Psychiatric negative.        Objective:  Objective   Vitals:   11/10/20 0854  BP: (!) 152/84  Pulse: (!) 53  Resp: 20  Temp: 98.2 F (36.8 C)  SpO2: 95%  Weight: 266 lb (120.7 kg)  Height: 5\' 10"  (1.778 m)   Body mass index is 38.17 kg/m.  Physical Exam HENT:     Head: Normocephalic.     Nose:     Comments: Wearing a mask Eyes:     Pupils: Pupils are equal, round, and reactive to light.  Pulmonary:     Effort: Pulmonary effort is normal.  Abdominal:     General: Abdomen is flat.     Palpations: Abdomen is soft.  Musculoskeletal:     Right lower leg: No edema.     Left lower leg: Edema present.     Comments: Right leg measures 41 cm and left leg 42 cm measured 10 cm from the tibial tuberosity.  Skin:    General: Skin is warm.     Capillary Refill: Capillary refill takes less than 2 seconds.  Neurological:     General: No focal deficit present.     Mental Status: She is alert.  Psychiatric:        Mood and Affect: Mood normal.        Behavior: Behavior normal.        Thought Content: Thought content normal.        Judgment: Judgment normal.     Data: IVC/Iliac: IVC, left common iliac vein, and left external iliac vein  appear patent. Stenosis at the distal common femoral vein stent with AVF  waveform characteristics.        Assessment/Plan:    55 year old female status post above-noted procedures.  She does have return of swelling in the left lower extremity although today very minimal percentage she does state that she is quite unhappy with her level of  swelling and is much worse when she is up and ambulatory.  She is scheduled for meniscal surgery which she is going to proceed with.  I will see her back in about a month with a CT venogram abdomen and pelvis to evaluate her stents.  Possible she will need left leg extremity venography for stenosis in the common femoral stent.  Is also possible at some point we will need to thrombose her arteriovenous fistula.  She will bridge back to Coumadin after her surgery      Waynetta Sandy MD [facility]

## 2020-11-10 NOTE — Telephone Encounter (Signed)
LMTCB 11/10/2020  Thanks,   -Mickel Baas

## 2020-11-11 DIAGNOSIS — E039 Hypothyroidism, unspecified: Secondary | ICD-10-CM | POA: Diagnosis not present

## 2020-11-11 LAB — SARS CORONAVIRUS 2 (TAT 6-24 HRS): SARS Coronavirus 2: NEGATIVE

## 2020-11-12 DIAGNOSIS — E039 Hypothyroidism, unspecified: Secondary | ICD-10-CM | POA: Diagnosis not present

## 2020-11-13 DIAGNOSIS — E039 Hypothyroidism, unspecified: Secondary | ICD-10-CM | POA: Diagnosis not present

## 2020-11-13 NOTE — H&P (Signed)
Chief Complaint  Patient presents with  . Knee Pain      Left knee wants to schedule surgery for left knee pain          Encounter Diagnoses  Name Primary?  . Unilateral primary osteoarthritis, left knee Yes  . Body mass index 40.0-44.9, adult ([place])    . Morbid obesity ([place])    . Other meniscus derangements, posterior horn of medial meniscus, left knee        54 history of DVT in 2020 history of revascularization lower extremities currently on warfarin continues to have severe left knee pain wants to have surgery understands that she is high risk.  We have gone over the several times went over it again today she is at risk for clotting her grass or having a DVT for having left leg and knee pain secondary to the multiple lumbar spine surgeries as well as the chronic pain opioid therapy         Past Medical History:  Diagnosis Date  . ADD (attention deficit disorder)    . Anxiety    . Back pain    . Bilateral swelling of feet    . Bipolar 1 disorder ([place])    . Bipolar 1 disorder ([place])    . Bipolar disorder ([place])    . Chewing difficulty    . Chronic fatigue syndrome    . Chronic kidney disease      Stage 3 kidney disease;dx by Dr. Sinda Du.   . Constipation    . Depression    . Diabetes mellitus without complication ([place])    . Dyspnea      with exertion  . GERD (gastroesophageal reflux disease)    . Headache      migraines  . High cholesterol    . History of blood clots    . History of DVT (deep vein thrombosis)      left leg  . Hypertension      states under control with meds., has been on med. x 2 years  . Hypothyroidism    . Joint pain    . Neuropathy    . Obsessive-compulsive disorder    . Peripheral vascular disease ([place])    . Prediabetes    . Respiratory failure requiring intubation ([place])    . Restless leg syndrome    . Shortness of breath    . Sleep apnea    . Swallowing difficulty    . Thrombocytopenia ([place]) 09/15/2019  . Trigger thumb of left hand  01/2018  . Trigger thumb of right hand        Past Surgical History:  Procedure Laterality Date  . ABDOMINAL HYSTERECTOMY  06/2016   complete  . APPLICATION OF WOUND VAC Left 04/27/2019   Procedure: APPLICATION OF WOUND VAC LEFT GROIN;  Surgeon: Waynetta Sandy, MD;  Location: [place];  Service: Vascular;  Laterality: Left;  . AV FISTULA PLACEMENT Left 11/24/2018   Procedure: ARTERIOVENOUS (AV) FISTULA CREATION LEFT SFA TO LEFT FEMORAL VEIN;  Surgeon: Waynetta Sandy, MD;  Location: [place];  Service: Vascular;  Laterality: Left;  . BACK SURGERY    . CHOLECYSTECTOMY    . COLONOSCOPY WITH PROPOFOL N/A 11/22/2019   Procedure: COLONOSCOPY WITH PROPOFOL;  Surgeon: [name], MD;  Location[facility] ENDOSCOPY;  Service: Gastroenterology;  Laterality: N/A;  . FEMORAL ARTERY EXPLORATION  03/30/2019   Procedure: Left Common Femoral Artery and Vein Exploration;  Surgeon: Waynetta Sandy, MD;  Location: [facility]  OR;  Service: Vascular;;  . FEMORAL-FEMORAL BYPASS GRAFT Left 11/24/2018   Procedure: BYPASS GRAFT FEMORAL-FEMORAL VENOUS LEFT TO RIGHT PALMA PROCEDURE USING CRYOVEIN;  Surgeon: Waynetta Sandy, MD;  Location: [place];  Service: Vascular;  Laterality: Left;  . GROIN DEBRIDEMENT Left 04/27/2019   Procedure: GROIN DEBRIDEMENT;  Surgeon: Waynetta Sandy, MD;  Location: [place];  Service: Vascular;  Laterality: Left;  . INSERTION OF ILIAC STENT  03/30/2019   Procedure: Stent of left common, external iliac veins and left common femoral vein;  Surgeon: Waynetta Sandy, MD;  Location: [place];  Service: Vascular;;  . LOWER EXTREMITY VENOGRAPHY N/A 08/17/2018   Procedure: LOWER EXTREMITY VENOGRAPHY - Central Venogram;  Surgeon: Waynetta Sandy, MD;  Location: [place] CV LAB;  Service: Cardiovascular;  Laterality: N/A;  . LOWER EXTREMITY VENOGRAPHY Bilateral 03/09/2019   Procedure: LOWER EXTREMITY VENOGRAPHY;  Surgeon: Waynetta Sandy, MD;   Location: [place] CV LAB;  Service: Cardiovascular;  Laterality: Bilateral;  . LOWER EXTREMITY VENOGRAPHY Left 08/16/2019   Procedure: LOWER EXTREMITY VENOGRAPHY;  Surgeon: Waynetta Sandy, MD;  Location: [place] CV LAB;  Service: Cardiovascular;  Laterality: Left;  . LUMBAR FUSION  11/21/2000   L5-S1  . LUMBAR SPINE SURGERY     x 2 others  . PATCH ANGIOPLASTY Left 03/30/2019   Procedure: Patch Angioplasty of the Left Common Femoral Vein using Venosure Biologic patch;  Surgeon: Waynetta Sandy, MD;  Location: [place] ;  Service: Vascular;  Laterality: Left;  . PERIPHERAL VASCULAR INTERVENTION Left 08/16/2019   Procedure: PERIPHERAL VASCULAR INTERVENTION;  Surgeon: Waynetta Sandy, MD;  Location: [place] CV LAB;  Service: Cardiovascular;  Laterality: Left;  common femoral/femoral vein stent  . TRIGGER FINGER RELEASE Right 12/01/2017   Procedure: RELEASE TRIGGER FINGER/A-1 PULLEY RIGHT THUMB;  Surgeon: Leanora Cover, MD;  Location: [place];  Service: Orthopedics;  Laterality: Right;  . TRIGGER FINGER RELEASE Left 01/26/2018   Procedure: LEFT TRIGGER THUMB RELEASE;  Surgeon: Leanora Cover, MD;  Location: [place];  Service: Orthopedics;  Laterality: Left;  . ULTRASOUND GUIDANCE FOR VASCULAR ACCESS Right 03/30/2019   Procedure: Ultrasound-guided cannulation right internal jugular vein;  Surgeon: Waynetta Sandy, MD;  Location: [facility] OR;  Service: Vascular;  Laterality: Right;   Family History  Problem Relation Age of Onset  . Heart disease Mother   . Hyperlipidemia Mother   . Hypertension Mother   . Bipolar disorder Mother   . Stroke Mother   . Depression Mother   . Sleep apnea Mother   . Obesity Mother   . Diabetes Father   . Heart disease Father   . Hyperlipidemia Father   . Hypertension Father   . Sleep apnea Father   . Obesity Father   . Drug abuse Daughter   . ADD / ADHD Daughter   . Drug abuse Daughter    . Anxiety disorder Daughter   . Bipolar disorder Daughter   . Hypertension Sister   . Hypertension Brother   . Hyperlipidemia Brother   . Heart disease Brother   . Bipolar disorder Maternal Aunt   . Suicidality Maternal Aunt    Social History   Tobacco Use  . Smoking status: Never Smoker  . Smokeless tobacco: Never Used  Vaping Use  . Vaping Use: Never used  Substance Use Topics  . Alcohol use: No  . Drug use: No   Patient's BMI is over 40 endomorphic body habitus Oriented x3 Mood  pleasant Affect normal Antalgic left lower extremity gait Tenderness medial and lateral joint line left knee Limited range of motion left knee Stability tests were normal Strength and muscle tone normal Skin without rash or erythema Peripheral edema Lymph node deferred Sensation normal    The patient meets the AMA guidelines for Morbid (severe) obesity with a BMI > 40.0 and I have recommended weight loss.   She has written clearance for surgery   Lovenox bridge therapy instituted.   Arthroscopy left knee partial medial meniscectomy versus repair   IMPRESSION: Horizontal tear posterior horn and body of the medial meniscus. The body of the medial meniscus is degenerated and diminutive.   Fraying along the free edge of the body of the lateral meniscus.   Mild-to-moderate osteoarthritis is worst in the medial compartment.     Electronically Signed   By: Inge Rise M.D.   On: 06/16/2020 08:19

## 2020-11-13 NOTE — Progress Notes (Signed)
Chief Complaint:   OBESITY Jane [name] is here to discuss her progress with her obesity treatment plan along with follow-up of her obesity related diagnoses. Jane [name] is on the [employer] and states she is following her eating plan approximately 50% of the time. Jane [name] states she is not getting exercise at this time.  Today's visit was #: 44 Starting weight: 283 lbs Starting date: 03/29/2020 Today's weight: 263 lbs Today's date: 11/09/2020 Total lbs lost to date: 20 lbs Total lbs lost since last in-office visit: 0  Interim History: Jane [name] voices that she has surgery scheduled for 11/14/20. She has ultrasound scheduled at 8 am and appointment with Dr Donzetta Matters at 9 am. She has been very stressed out and did some emotional eating. Jane [name] has realized she's been emotionally eating. Recovery from surgery is 6 weeks.  Subjective:   1. Swelling of lower leg Jane [name] is experiencing significant LLE edema and pain. She is on Lovenox (transitioned from coumadin for recurrent DVT).  2. Vitamin D deficiency Vitamin D level was 45.3. No nausea, vomiting, or muscle weakness. She endorses fatigue. Jane [name] is on prescription Vitamin D.    Assessment/Plan:   1. Swelling of lower leg Jane [name] is encouraged to go to urgent care for evaluation of left lower extremity swelling and pain.  2. Vitamin D deficiency Low Vitamin D level contributes to fatigue and are associated with obesity, breast, and colon cancer. She agrees to continue to take prescription Vitamin D @50 ,000 IU every week and will follow-up for routine testing of Vitamin D, at least 2-3 times per year to avoid over-replacement. Will refill Vitamin D 50,000 IU/wk # 4. No refill.  - Vitamin D, Ergocalciferol, (DRISDOL) 1.25 MG (50000 UNIT) CAPS capsule; Take 1 capsule (50,000 Units total) by mouth every 7 (seven) days.  Dispense: 4 capsule; Refill: 0  3. Class 2 severe obesity with serious comorbidity and body mass index (BMI) of 37.0 to 37.9 in adult,  unspecified obesity type (HCC)  Jane [name] is currently in the action stage of change. As such, her goal is to continue with weight loss efforts. She has agreed to the [facility] along with portion control.  Exercise goals: No exercise has been prescribed at this time.  Behavioral modification strategies: increasing lean protein intake, meal planning and cooking strategies and keeping healthy foods in the home.  Jane [name] has agreed to follow-up with our clinic in 4-5 weeks. She was informed of the importance of frequent follow-up visits to maximize her success with intensive lifestyle modifications for her multiple health conditions.   Objective:   Blood pressure (!) 152/83, pulse 64, temperature 98.3 F (36.8 C), temperature source Oral, height 5\' 10"  (1.778 m), weight 263 lb (119.3 kg), SpO2 97 %. Body mass index is 37.74 kg/m.  General: Cooperative, alert, well developed, in no acute distress. HEENT: Conjunctivae and lids unremarkable. Cardiovascular: Regular rhythm.  Lungs: Normal work of breathing. Neurologic: No focal deficits.   Lab Results  Component Value Date   CREATININE 1.19 (H) 11/10/2020   BUN 26 (H) 11/10/2020   NA 138 11/10/2020   K 3.6 11/10/2020   CL 101 11/10/2020   CO2 27 11/10/2020   Lab Results  Component Value Date   ALT 22 10/17/2020   AST 22 10/17/2020   ALKPHOS 103 10/17/2020   BILITOT 0.5 10/17/2020   Lab Results  Component Value Date   HGBA1C 5.8 (A) 10/24/2020   HGBA1C 6.2 (H) 03/29/2020   HGBA1C 6.3 (H) 10/25/2019  HGBA1C 6.3 (H) 03/26/2019   HGBA1C 7.5 11/18/2018   Lab Results  Component Value Date   INSULIN 21.5 03/29/2020   Lab Results  Component Value Date   TSH 0.107 (L) 05/18/2020   Lab Results  Component Value Date   CHOL 203 (H) 03/29/2020   HDL 37 (L) 03/29/2020   LDLCALC 114 (H) 03/29/2020   TRIG 296 (H) 03/29/2020   CHOLHDL 5.0 (H) 10/25/2019   Lab Results  Component Value Date   WBC 5.7 11/10/2020   HGB 13.5  11/10/2020   HCT 40.8 11/10/2020   MCV 93.8 11/10/2020   PLT 137 (L) 11/10/2020   Lab Results  Component Value Date   IRON 69 10/17/2020   TIBC 312 10/17/2020   FERRITIN 145 10/17/2020      Attestation Statements:   Reviewed by clinician on day of visit: allergies, medications, problem list, medical history, surgical history, family history, social history, and previous encounter notes.   I, Para March, am acting as transcriptionist for Coralie Common, MD.  I have reviewed the above documentation for accuracy and completeness, and I agree with the above. - Jinny Blossom, MD

## 2020-11-13 NOTE — Telephone Encounter (Signed)
Surgery 11/14/2020 with Dr. Aline Brochure, ortho. Patient started Lovenox last week on Tuesday. Patient not sure when she will restart coumadin. Please advise when she needs to come in for PT/INR

## 2020-11-13 NOTE — Telephone Encounter (Signed)
Patient advised as below.  

## 2020-11-13 NOTE — Telephone Encounter (Signed)
Restart Coumadin at previous dose on day after surgery.  Continue lovenox then as well. Recheck INR 5-7 days after resuming Coumadin. Will be able to stop Lovenox when INR is therapeutic

## 2020-11-14 ENCOUNTER — Ambulatory Visit (HOSPITAL_COMMUNITY): Payer: Medicaid Other | Admitting: Anesthesiology

## 2020-11-14 ENCOUNTER — Ambulatory Visit (INDEPENDENT_AMBULATORY_CARE_PROVIDER_SITE_OTHER): Payer: Medicaid Other | Admitting: Family Medicine

## 2020-11-14 ENCOUNTER — Encounter (HOSPITAL_COMMUNITY)
Admission: RE | Disposition: A | Discharge status: Home / Self Care | Payer: Self-pay | Source: Home / Self Care | Attending: Orthopedic Surgery

## 2020-11-14 ENCOUNTER — Encounter: Payer: Self-pay | Admitting: Orthopedic Surgery

## 2020-11-14 ENCOUNTER — Encounter (HOSPITAL_COMMUNITY): Payer: Self-pay | Admitting: Orthopedic Surgery

## 2020-11-14 ENCOUNTER — Other Ambulatory Visit: Payer: Self-pay

## 2020-11-14 ENCOUNTER — Ambulatory Visit (HOSPITAL_COMMUNITY)
Admission: RE | Admit: 2020-11-14 | Discharge: 2020-11-14 | Disposition: A | Discharge status: Home / Self Care | Payer: Medicaid Other | Attending: Orthopedic Surgery | Admitting: Orthopedic Surgery

## 2020-11-14 DIAGNOSIS — M23204 Derangement of unspecified medial meniscus due to old tear or injury, left knee: Secondary | ICD-10-CM

## 2020-11-14 DIAGNOSIS — Z86718 Personal history of other venous thrombosis and embolism: Secondary | ICD-10-CM | POA: Insufficient documentation

## 2020-11-14 DIAGNOSIS — Z95820 Peripheral vascular angioplasty status with implants and grafts: Secondary | ICD-10-CM | POA: Insufficient documentation

## 2020-11-14 DIAGNOSIS — S83242A Other tear of medial meniscus, current injury, left knee, initial encounter: Secondary | ICD-10-CM | POA: Diagnosis not present

## 2020-11-14 DIAGNOSIS — M23332 Other meniscus derangements, other medial meniscus, left knee: Secondary | ICD-10-CM | POA: Diagnosis not present

## 2020-11-14 DIAGNOSIS — M23322 Other meniscus derangements, posterior horn of medial meniscus, left knee: Secondary | ICD-10-CM | POA: Diagnosis present

## 2020-11-14 DIAGNOSIS — I10 Essential (primary) hypertension: Secondary | ICD-10-CM | POA: Diagnosis not present

## 2020-11-14 DIAGNOSIS — E119 Type 2 diabetes mellitus without complications: Secondary | ICD-10-CM | POA: Diagnosis not present

## 2020-11-14 DIAGNOSIS — M1712 Unilateral primary osteoarthritis, left knee: Secondary | ICD-10-CM | POA: Insufficient documentation

## 2020-11-14 DIAGNOSIS — M94262 Chondromalacia, left knee: Secondary | ICD-10-CM | POA: Insufficient documentation

## 2020-11-14 HISTORY — PX: KNEE ARTHROSCOPY WITH MENISCAL REPAIR: SHX5653

## 2020-11-14 LAB — GLUCOSE, CAPILLARY: Glucose-Capillary: 105 mg/dL — ABNORMAL HIGH (ref 70–99)

## 2020-11-14 SURGERY — ARTHROSCOPY, KNEE, WITH MENISCUS REPAIR
Anesthesia: General | Site: Knee | Laterality: Left

## 2020-11-14 MED ORDER — CHLORHEXIDINE GLUCONATE 0.12 % MT SOLN: 15.0000 mL | Freq: Once | OROMUCOSAL | Status: DC

## 2020-11-14 MED ORDER — HYDROMORPHONE HCL 1 MG/ML IJ SOLN
0.2500 mg | INTRAMUSCULAR | Status: DC | PRN
  Administered 2020-11-14: 0.5 mg via INTRAVENOUS
  Administered 2020-11-14 (×2): 0.25 mg via INTRAVENOUS
  Administered 2020-11-14: 0.5 mg via INTRAVENOUS
  Filled 2020-11-14 (×2): qty 0.5

## 2020-11-14 MED ORDER — PROPOFOL 10 MG/ML IV BOLUS
INTRAVENOUS | Status: DC | PRN
  Administered 2020-11-14: 240 mg via INTRAVENOUS

## 2020-11-14 MED ORDER — ORAL CARE MOUTH RINSE: 15.0000 mL | Freq: Once | OROMUCOSAL | Status: DC

## 2020-11-14 MED ORDER — ALBUTEROL SULFATE HFA 108 (90 BASE) MCG/ACT IN AERS
INHALATION_SPRAY | RESPIRATORY_TRACT | Status: DC | PRN
  Administered 2020-11-14 (×2): 2 via RESPIRATORY_TRACT

## 2020-11-14 MED ORDER — OXYCODONE HCL 5 MG PO TABS
5.0000 mg | ORAL_TABLET | Freq: Once | ORAL | Status: AC
  Administered 2020-11-14: 5 mg via ORAL
  Filled 2020-11-14: qty 1

## 2020-11-14 MED ORDER — HYDROMORPHONE HCL 1 MG/ML IJ SOLN
INTRAMUSCULAR | Status: AC
  Filled 2020-11-14: qty 0.5

## 2020-11-14 MED ORDER — BUPIVACAINE-EPINEPHRINE (PF) 0.5% -1:200000 IJ SOLN
INTRAMUSCULAR | Status: AC
  Filled 2020-11-14: qty 60

## 2020-11-14 MED ORDER — ACETAMINOPHEN 500 MG PO TABS
1000.0000 mg | ORAL_TABLET | Freq: Four times a day (QID) | ORAL | Status: DC | PRN
  Administered 2020-11-14: 1000 mg via ORAL
  Filled 2020-11-14: qty 2

## 2020-11-14 MED ORDER — SODIUM CHLORIDE 0.9 % IR SOLN
Status: DC | PRN
  Administered 2020-11-14: 1000 mL

## 2020-11-14 MED ORDER — HYDROCODONE-ACETAMINOPHEN 10-325 MG PO TABS: 1.0000 | ORAL_TABLET | Freq: Four times a day (QID) | ORAL | 0 refills | Status: AC | PRN

## 2020-11-14 MED ORDER — PROPOFOL 10 MG/ML IV BOLUS
INTRAVENOUS | Status: AC
  Filled 2020-11-14: qty 20

## 2020-11-14 MED ORDER — ALBUTEROL SULFATE HFA 108 (90 BASE) MCG/ACT IN AERS
INHALATION_SPRAY | RESPIRATORY_TRACT | Status: AC
  Filled 2020-11-14: qty 6.7

## 2020-11-14 MED ORDER — CEFAZOLIN SODIUM-DEXTROSE 2-4 GM/100ML-% IV SOLN: 2.0000 g | INTRAVENOUS | Status: DC

## 2020-11-14 MED ORDER — ONDANSETRON HCL 4 MG/2ML IJ SOLN
INTRAMUSCULAR | Status: AC
  Filled 2020-11-14: qty 2

## 2020-11-14 MED ORDER — FENTANYL CITRATE (PF) 100 MCG/2ML IJ SOLN
INTRAMUSCULAR | Status: AC
  Filled 2020-11-14: qty 2

## 2020-11-14 MED ORDER — LACTATED RINGERS IV SOLN: INTRAVENOUS | Status: DC | PRN

## 2020-11-14 MED ORDER — CEFAZOLIN SODIUM-DEXTROSE 2-4 GM/100ML-% IV SOLN
INTRAVENOUS | Status: AC
  Filled 2020-11-14: qty 100

## 2020-11-14 MED ORDER — MIDAZOLAM HCL 2 MG/2ML IJ SOLN
INTRAMUSCULAR | Status: AC
  Filled 2020-11-14: qty 2

## 2020-11-14 MED ORDER — GLYCOPYRROLATE 0.2 MG/ML IJ SOLN
INTRAMUSCULAR | Status: DC | PRN
  Administered 2020-11-14: .2 mg via INTRAVENOUS

## 2020-11-14 MED ORDER — EPINEPHRINE PF 1 MG/ML IJ SOLN
INTRAMUSCULAR | Status: AC
  Filled 2020-11-14: qty 5

## 2020-11-14 MED ORDER — DEXAMETHASONE SODIUM PHOSPHATE 10 MG/ML IJ SOLN
INTRAMUSCULAR | Status: AC
  Filled 2020-11-14: qty 1

## 2020-11-14 MED ORDER — MIDAZOLAM HCL 5 MG/5ML IJ SOLN
INTRAMUSCULAR | Status: DC | PRN
  Administered 2020-11-14: 2 mg via INTRAVENOUS

## 2020-11-14 MED ORDER — FENTANYL CITRATE (PF) 100 MCG/2ML IJ SOLN
INTRAMUSCULAR | Status: DC | PRN
  Administered 2020-11-14: 100 ug via INTRAVENOUS

## 2020-11-14 MED ORDER — BUPIVACAINE-EPINEPHRINE (PF) 0.5% -1:200000 IJ SOLN
INTRAMUSCULAR | Status: DC | PRN
  Administered 2020-11-14: 60 mL

## 2020-11-14 MED ORDER — EPHEDRINE SULFATE 50 MG/ML IJ SOLN
INTRAMUSCULAR | Status: DC | PRN
  Administered 2020-11-14 (×2): 10 mg via INTRAVENOUS

## 2020-11-14 MED ORDER — SODIUM CHLORIDE 0.9 % IR SOLN
Status: DC | PRN
  Administered 2020-11-14 (×3): 3000 mL

## 2020-11-14 MED ORDER — DEXAMETHASONE SODIUM PHOSPHATE 10 MG/ML IJ SOLN
INTRAMUSCULAR | Status: DC | PRN
Start: 2020-11-14 — End: 2020-11-14
  Administered 2020-11-14: 10 mg via INTRAVENOUS

## 2020-11-14 MED ORDER — ONDANSETRON HCL 4 MG/2ML IJ SOLN
4.0000 mg | Freq: Once | INTRAMUSCULAR | Status: AC
  Administered 2020-11-14: 4 mg via INTRAVENOUS
  Filled 2020-11-14: qty 2

## 2020-11-14 MED ORDER — ONDANSETRON HCL 4 MG/2ML IJ SOLN: 4.0000 mg | Freq: Once | INTRAMUSCULAR | Status: DC | PRN

## 2020-11-14 MED ORDER — MEPERIDINE HCL 50 MG/ML IJ SOLN: 6.2500 mg | INTRAMUSCULAR | Status: DC | PRN

## 2020-11-14 MED ORDER — ONDANSETRON HCL 4 MG/2ML IJ SOLN
INTRAMUSCULAR | Status: DC | PRN
  Administered 2020-11-14: 4 mg via INTRAVENOUS

## 2020-11-14 MED ORDER — LIDOCAINE HCL (CARDIAC) PF 100 MG/5ML IV SOSY
PREFILLED_SYRINGE | INTRAVENOUS | Status: DC | PRN
  Administered 2020-11-14: 50 mg via INTRATRACHEAL

## 2020-11-14 MED ORDER — DEXTROSE 5 % IV SOLN
3.0000 g | INTRAVENOUS | Status: AC
  Administered 2020-11-14: 3 g via INTRAVENOUS
  Filled 2020-11-14: qty 3000

## 2020-11-14 MED ORDER — LACTATED RINGERS IV SOLN: Freq: Once | INTRAVENOUS | Status: AC

## 2020-11-14 MED ORDER — EPHEDRINE 5 MG/ML INJ
INTRAVENOUS | Status: AC
  Filled 2020-11-14: qty 10

## 2020-11-14 SURGICAL SUPPLY — 48 items
ABLATOR ASPIRATE 50D MULTI-PRT (SURGICAL WAND) ×2 IMPLANT
APL PRP STRL LF DISP 70% ISPRP (MISCELLANEOUS) ×2
BLADE SURG SZ11 CARB STEEL (BLADE) ×2 IMPLANT
BNDG CMPR STD VLCR NS LF 5.8X6 (GAUZE/BANDAGES/DRESSINGS) ×1
BNDG ELASTIC 6X5.8 VLCR NS LF (GAUZE/BANDAGES/DRESSINGS) ×2 IMPLANT
CHLORAPREP W/TINT 26 (MISCELLANEOUS) ×4 IMPLANT
CLOTH BEACON ORANGE TIMEOUT ST (SAFETY) ×2 IMPLANT
COOLER ICEMAN CLASSIC (MISCELLANEOUS) ×2 IMPLANT
COVER WAND RF STERILE (DRAPES) ×2 IMPLANT
CUFF TOURN SGL QUICK 34 (TOURNIQUET CUFF) ×2
CUFF TRNQT CYL 34X4.125X (TOURNIQUET CUFF) ×1 IMPLANT
DECANTER SPIKE VIAL GLASS SM (MISCELLANEOUS) ×4 IMPLANT
DISSECTOR 3.5MM X 13CM CVD (MISCELLANEOUS) ×2 IMPLANT
GAUZE 4X4 16PLY RFD (DISPOSABLE) ×2 IMPLANT
GAUZE XEROFORM 5X9 LF (GAUZE/BANDAGES/DRESSINGS) ×2 IMPLANT
GLOVE BIOGEL PI IND STRL 7.0 (GLOVE) ×2 IMPLANT
GLOVE BIOGEL PI INDICATOR 7.0 (GLOVE) ×2
GLOVE ECLIPSE 6.5 STRL STRAW (GLOVE) ×2 IMPLANT
GLOVE SKINSENSE NS SZ8.0 LF (GLOVE) ×1
GLOVE SKINSENSE STRL SZ8.0 LF (GLOVE) ×1 IMPLANT
GLOVE SS N UNI LF 8.5 STRL (GLOVE) ×2 IMPLANT
GOWN STRL REUS W/TWL LRG LVL3 (GOWN DISPOSABLE) ×2 IMPLANT
GOWN STRL REUS W/TWL XL LVL3 (GOWN DISPOSABLE) ×2 IMPLANT
HLDR LEG FOAM (MISCELLANEOUS) ×1 IMPLANT
IV NS IRRIG 3000ML ARTHROMATIC (IV SOLUTION) ×6 IMPLANT
KIT BLADEGUARD II DBL (SET/KITS/TRAYS/PACK) ×2 IMPLANT
KIT TURNOVER CYSTO (KITS) ×2 IMPLANT
LEG HOLDER FOAM (MISCELLANEOUS) ×2
MANIFOLD NEPTUNE II (INSTRUMENTS) ×2 IMPLANT
MARKER SKIN DUAL TIP RULER LAB (MISCELLANEOUS) ×2 IMPLANT
NEEDLE HYPO 18GX1.5 BLUNT FILL (NEEDLE) ×2 IMPLANT
NEEDLE HYPO 21X1.5 SAFETY (NEEDLE) ×2 IMPLANT
NEEDLE SPNL 18GX3.5 QUINCKE PK (NEEDLE) ×2 IMPLANT
NS IRRIG 1000ML POUR BTL (IV SOLUTION) ×2 IMPLANT
PACK ARTHRO LIMB DRAPE STRL (MISCELLANEOUS) ×2 IMPLANT
PAD ABD 5X9 TENDERSORB (GAUZE/BANDAGES/DRESSINGS) ×2 IMPLANT
PAD ARMBOARD 7.5X6 YLW CONV (MISCELLANEOUS) ×2 IMPLANT
PAD COLD SHLDR WRAP-ON (PAD) ×2 IMPLANT
PADDING CAST COTTON 6X4 STRL (CAST SUPPLIES) ×2 IMPLANT
PORT APPOLLO RF 90DEGREE MULTI (SURGICAL WAND) IMPLANT
RESECTOR TORPEDO 4MM 13CM CVD (MISCELLANEOUS) ×2 IMPLANT
SET ARTHROSCOPY INST (INSTRUMENTS) ×2 IMPLANT
SET BASIN LINEN APH (SET/KITS/TRAYS/PACK) ×2 IMPLANT
SUT ETHILON 3 0 FSL (SUTURE) ×2 IMPLANT
SYR 10ML LL (SYRINGE) ×2 IMPLANT
SYR 30ML LL (SYRINGE) ×2 IMPLANT
TUBING IN/OUT FLOW W/MAIN PUMP (TUBING) ×2 IMPLANT
YANKAUER SUCT BULB TIP 10FT TU (MISCELLANEOUS) ×6 IMPLANT

## 2020-11-14 NOTE — Progress Notes (Signed)
Spoke with Dr Charna Elizabeth re pt sats dropping whne asleep- Gave IS and pt uses correctly and sats are maintained. Pt sleepy from pain meds. MD asks that Dtr keep good eye on pt at home for next 4 hrs til pain meds wear off. Pt and post op nurse aware. Dtr aware also.

## 2020-11-14 NOTE — Anesthesia Postprocedure Evaluation (Signed)
Anesthesia Post Note  Patient: Jane [name]  Procedure(s) Performed: LEFT KNEE ARTHROSCOPY WITH PARTIAL MEDIAL MENISCECTOMY (Left Knee)  Patient location during evaluation: PACU Anesthesia Type: General Level of consciousness: awake, oriented and patient cooperative Pain management: satisfactory to patient Vital Signs Assessment: post-procedure vital signs reviewed and stable Respiratory status: spontaneous breathing, respiratory function stable, nonlabored ventilation and patient connected to face mask oxygen Cardiovascular status: stable Postop Assessment: no apparent nausea or vomiting Anesthetic complications: no   No complications documented.   Last Vitals:  Vitals:   11/14/20 0849  BP: 128/81  Pulse: (!) 57  Resp: 13  Temp: 36.8 C  SpO2: 96%    Last Pain:  Vitals:   11/14/20 0849  TempSrc: Oral  PainSc: 4                  Shan Valdes

## 2020-11-14 NOTE — Anesthesia Procedure Notes (Signed)
Procedure Name: LMA Insertion Date/Time: 11/14/2020 10:54 AM Performed by: Jonna Munro, CRNA Pre-anesthesia Checklist: Patient identified, Emergency Drugs available, Suction available, Patient being monitored and Timeout performed Patient Re-evaluated:Patient Re-evaluated prior to induction Oxygen Delivery Method: Circle system utilized Preoxygenation: Pre-oxygenation with 100% oxygen Induction Type: IV induction LMA: LMA inserted LMA Size: 4.0 Number of attempts: 1 Placement Confirmation: positive ETCO2 and breath sounds checked- equal and bilateral Tube secured with: Tape Dental Injury: Teeth and Oropharynx as per pre-operative assessment

## 2020-11-14 NOTE — Brief Op Note (Signed)
11/14/2020  12:16 PM  PATIENT:  Jane [name]  55 y.o. 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  Patient identified properly surgical site confirmed left knee marked chart reviewed complete  Patient taken to surgery general anesthesia.  Left leg prepped and draped sterilely  Timeout completed  Standard lateral portal scope placed into the joint diagnostic arthroscopy completed by circumferentially during the joint  Complex tear posterior horn medial meniscus addressed by first establishing a medial portal and probing the tear.  It was a complex tear to involve mainly the posterior horn but it extended towards the body.  It was not repairable  A straight shaver was placed into the joint to resect the torn meniscus.  The medial collateral ligament was released to aid in visualization and then using a combination of instruments including a 50 degree ArthroCare wand straight biter curved shaver and placing the scope in the medial side and operating from the lateral side the meniscus was resected and balanced.  This was confirmed with the probe.  We then remove the medial plica with the ArthroCare wand and the shaver  The joint was irrigated we removed as much fluid as possible with suction and then injected with 60 cc Marcaine with epinephrine  Portals closed with 3-0 nylon  Dressing and Cryo/Cuff applied  Patient extubated taken recovery room in stable  condition  The patient can resume anticoagulants on January 13 which is Thursday  She is weightbearing as tolerated   SURGEON:  Surgeon(s) and Role:    * Carole Civil, MD - Primary  PHYSICIAN ASSISTANT:   ASSISTANTS: none   ANESTHESIA:   general  EBL:  none   BLOOD ADMINISTERED:none  DRAINS: none   LOCAL MEDICATIONS USED:  MARCAINE     SPECIMEN:  No Specimen  DISPOSITION OF SPECIMEN:  N/A  COUNTS:  YES  TOURNIQUET:  * Missing tourniquet times found for documented tourniquets in log: [id number] *  DICTATION: .Dragon Dictation  PLAN OF CARE: Discharge to home after PACU  PATIENT DISPOSITION:  PACU - hemodynamically stable.   Delay start of Pharmacological VTE agent (>24hrs) due to surgical blood loss or risk of bleeding: no

## 2020-11-14 NOTE — Discharge Instructions (Signed)
Knee Arthroscopy, Care After This sheet gives you information about how to care for yourself after your procedure. Your health care provider may also give you more specific instructions. If you have problems or questions, contact your health care provider. What can I expect after the procedure? After the procedure, it is common to have:  Soreness.  Swelling.  Pain.  A small amount of fluid from the incisions. Follow these instructions at home: Medicines  Take over-the-counter and prescription medicines only as told by your health care provider.  Ask your health care provider if the medicine prescribed to you: ? Requires you to avoid driving or using machinery. ? Can cause constipation. You may need to take these actions to prevent or treat constipation:  Drink enough fluid to keep your urine pale yellow.  Take over-the-counter or prescription medicines.  Eat foods that are high in fiber, such as beans, whole grains, and fresh fruits and vegetables.  Limit foods that are high in fat and processed sugars, such as fried or sweet foods. If you have a brace or immobilizer:  Wear it as told by your health care provider. Remove it only as told by your health care provider.  Loosen it if your toes tingle, become numb, or turn cold and blue.  Keep it clean and dry. Bathing  Do not take baths, swim, or use a hot tub until your health care provider approves. Ask your health care provider if you may take showers.  Keep your bandage (dressing) dry until your health care provider says that it can be removed.  If the brace or immobilizer is not waterproof: ? Do not let it get wet. ? Cover it with a watertight covering when you take a bath or shower. Incision care  Follow instructions from your health care provider about how to take care of your incisions. Make sure you: ? Wash your hands with soap and water for at least 20 seconds before and after you change your dressing. If soap and  water are not available, use hand sanitizer. ? Change your dressing as told by your health care provider. ? Leave stitches (sutures) or adhesive strips in place. These skin closures may need to stay in place for 2 weeks or longer. If adhesive strip edges start to loosen and curl up, you may trim the loose edges. Do not remove adhesive strips completely unless your health care provider tells you to do that.  Check your incision areas every day for signs of infection. Check for: ? Redness. ? More swelling or pain. ? Blood or more fluid. ? Warmth. ? Pus or a bad smell.   Managing pain, stiffness, and swelling  If directed, put ice on the injured area. To do this: ? If you have a removable brace or immobilizer, remove it as told by your health care provider. ? Put ice in a plastic bag or use the icing device (cold therapy unit) that you were given. Follow instructions about how to use the icing device. ? Place a towel between your skin and the bag or between your skin and the icing device. ? Leave the ice on for 20 minutes, 2-3 times a day. ? Remove the ice if your skin turns bright red. This is very important. If you cannot feel pain, heat, or cold, you have a greater risk of damage to the area.  Move your toes often to reduce stiffness and swelling.  Raise (elevate) the injured area above the level of your heart  while you are sitting or lying down.   Activity  Do not use your knee to support your body weight until your health care provider says that you can. Follow weight-bearing restrictions as told. Use crutches or other devices to help you move around (assistive devices) as told by your health care provider.  Ask your health care provider what activities are safe for you during recovery, and what activities you need to avoid.  If physical therapy was prescribed, do exercises as told by your health care provider. Doing exercises may help improve knee movement, range of motion, and  flexibility.  Do not lift anything that is heavier than 10 lb (4.5 kg), or the limit that you are told, until your health care provider says that it is safe. General instructions  Do not drive until your health care provider approves. You may be able to drive after 1-3 weeks.  Do not use any products that contain nicotine or tobacco, such as cigarettes, e-cigarettes, and chewing tobacco. These can delay incision or bone healing after surgery. If you need help quitting, ask your health care provider.  Wear compression stockings as told by your health care provider. These stockings help to prevent blood clots and reduce swelling in your legs.  Keep all follow-up visits. This is important. Contact a health care provider if:  You have any of these signs of infection: ? Redness or more pain around an incision. ? Blood or more fluid coming from an incision. ? Warmth coming from an incision. ? Pus or a bad smell coming from an incision. ? More swelling in your knee. ? A fever or chills.  You have severe knee pain, and medicine does not help.  An incision opens up. Get help right away if:  You have trouble breathing or shortness of breath.  You have chest pain.  You develop pain or swelling in your lower leg or at the back of your knee.  You have numbness or tingling in your lower leg or your foot.  You notice that your foot or toes look darker than normal or are cooler than normal. These symptoms may represent a serious problem that is an emergency. Do not wait to see if the symptoms will go away. Get medical help right away. Call your local emergency services (911 in the [place] Do not drive yourself to the hospital. Summary  To help relieve pain and swelling, put ice on the injured area for 20 minutes, 2-3 times a day.  Raise (elevate) the injured area above the level of your heart while you are sitting or lying down.  If physical therapy was prescribed, do exercises as told by  your health care provider. Exercises may help improve range of motion. This information is not intended to replace advice given to you by your health care provider. Make sure you discuss any questions you have with your health care provider. Document Revised: 02/21/2020 Document Reviewed: 02/21/2020 Elsevier Patient Education  [place].

## 2020-11-14 NOTE — Brief Op Note (Signed)
11/14/2020  12:11 PM  PATIENT:  Jane [name]  55 y.o. 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  Patient identified properly surgical site confirmed left knee marked chart reviewed complete  Patient taken to surgery general anesthesia.  Left leg prepped and draped sterilely  Timeout completed  Standard lateral portal scope placed into the joint diagnostic arthroscopy completed by circumferentially during the joint  Complex tear posterior horn medial meniscus addressed by first establishing a medial portal and probing the tear.  It was a complex tear to involve mainly the posterior horn but it extended towards the body.  It was not repairable  A straight shaver was placed into the joint to resect the torn meniscus.  The medial collateral ligament was released to aid in visualization and then using a combination of instruments including a 50 degree ArthroCare wand straight biter curved shaver and placing the scope in the medial side and operating from the lateral side the meniscus was resected and balanced.  This was confirmed with the probe.  We then remove the medial plica with the ArthroCare wand and the shaver  The joint was irrigated we removed as much fluid as possible with suction and then injected with 60 cc Marcaine with epinephrine  Portals closed with 3-0 nylon  Dressing and Cryo/Cuff applied  Patient extubated taken recovery room in stable  condition  The patient can resume anticoagulants on January 13 which is Thursday  She is weightbearing as tolerated   SURGEON:  Surgeon(s) and Role:    * Carole Civil, MD - Primary  PHYSICIAN ASSISTANT:   ASSISTANTS: none   ANESTHESIA:   general  EBL:  none   BLOOD ADMINISTERED:none  DRAINS: none   LOCAL MEDICATIONS USED:  MARCAINE     SPECIMEN:  No Specimen  DISPOSITION OF SPECIMEN:  N/A  COUNTS:  YES  TOURNIQUET:  * Missing tourniquet times found for documented tourniquets in log: [id number] *  DICTATION: .Dragon Dictation  PLAN OF CARE: Discharge to home after PACU  PATIENT DISPOSITION:  PACU - hemodynamically stable.   Delay start of Pharmacological VTE agent (>24hrs) due to surgical blood loss or risk of bleeding: no

## 2020-11-14 NOTE — Anesthesia Preprocedure Evaluation (Addendum)
Anesthesia Evaluation  Patient identified by MRN, date of birth, ID band Patient awake    Reviewed: Allergy & Precautions, NPO status , Patient's Chart, lab work & pertinent test results  History of Anesthesia Complications Negative for: history of anesthetic complications  Airway Mallampati: III  TM Distance: >3 FB Neck ROM: Full   Comment: Tongue tied  Dental  (+) Dental Advisory Given, Chipped   Pulmonary shortness of breath, sleep apnea and Continuous Positive Airway Pressure Ventilation ,  Respiratory failure    Pulmonary exam normal breath sounds clear to auscultation       Cardiovascular Exercise Tolerance: Good hypertension, Pt. on medications + Peripheral Vascular Disease  Normal cardiovascular exam Rhythm:Regular Rate:Normal     Neuro/Psych  Headaches, PSYCHIATRIC DISORDERS Anxiety Depression Bipolar Disorder  Neuromuscular disease    GI/Hepatic GERD  Medicated and Controlled,  Endo/Other  diabetes, Well Controlled, Type 2Hypothyroidism   Renal/GU Renal InsufficiencyRenal disease     Musculoskeletal  (+) Arthritis  (lumbar fusion), Osteoarthritis,    Abdominal   Peds  Hematology  (+) anemia ,   Anesthesia Other Findings Tongue tied   Reproductive/Obstetrics                          Anesthesia Physical Anesthesia Plan  ASA: III  Anesthesia Plan: General   Post-op Pain Management:    Induction: Intravenous  PONV Risk Score and Plan: Ondansetron and Metaclopromide  Airway Management Planned: LMA  Additional Equipment:   Intra-op Plan:   Post-operative Plan: Extubation in OR  Informed Consent: I have reviewed the patients History and Physical, chart, labs and discussed the procedure including the risks, benefits and alternatives for the proposed anesthesia with the patient or authorized representative who has indicated his/her understanding and acceptance.     Dental  advisory given  Plan Discussed with: CRNA and Surgeon  Anesthesia Plan Comments:        Anesthesia Quick Evaluation

## 2020-11-14 NOTE — Op Note (Signed)
11/14/2020  12:16 PM  PATIENT:  Jane [name]  55 y.o. 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  Patient identified properly surgical site confirmed left knee marked chart reviewed complete  Patient taken to surgery general anesthesia.  Left leg prepped and draped sterilely  Timeout completed  Standard lateral portal scope placed into the joint diagnostic arthroscopy completed by circumferentially during the joint  Complex tear posterior horn medial meniscus addressed by first establishing a medial portal and probing the tear.  It was a complex tear to involve mainly the posterior horn but it extended towards the body.  It was not repairable  A straight shaver was placed into the joint to resect the torn meniscus.  The medial collateral ligament was released to aid in visualization and then using a combination of instruments including a 50 degree ArthroCare wand straight biter curved shaver and placing the scope in the medial side and operating from the lateral side the meniscus was resected and balanced.  This was confirmed with the probe.  We then remove the medial plica with the ArthroCare wand and the shaver  The joint was irrigated we removed as much fluid as possible with suction and then injected with 60 cc Marcaine with epinephrine  Portals closed with 3-0 nylon  Dressing and Cryo/Cuff applied  Patient extubated taken recovery room in stable  condition  The patient can resume anticoagulants on January 13 which is Thursday  She is weightbearing as tolerated   SURGEON:  Surgeon(s) and Role:    * Harrison, Stanley E, MD - Primary  PHYSICIAN ASSISTANT:   ASSISTANTS: none   ANESTHESIA:   general  EBL:  none   BLOOD ADMINISTERED:none  DRAINS: none   LOCAL MEDICATIONS USED:  MARCAINE     SPECIMEN:  No Specimen  DISPOSITION OF SPECIMEN:  N/A  COUNTS:  YES  TOURNIQUET:  * Missing tourniquet times found for documented tourniquets in log: [id number] *  DICTATION: .Dragon Dictation  PLAN OF CARE: Discharge to home after PACU  PATIENT DISPOSITION:  PACU - hemodynamically stable.   Delay start of Pharmacological VTE agent (>24hrs) due to surgical blood loss or risk of bleeding: no  

## 2020-11-14 NOTE — Transfer of Care (Signed)
Immediate Anesthesia Transfer of Care Note  Patient: Jane [name]  Procedure(s) Performed: LEFT KNEE ARTHROSCOPY WITH PARTIAL MEDIAL MENISCECTOMY (Left Knee)  Patient Location: PACU  Anesthesia Type:General  Level of Consciousness: awake, alert , oriented and patient cooperative  Airway & Oxygen Therapy: Patient Spontanous Breathing and Patient connected to face mask oxygen  Post-op Assessment: Report given to RN and Post -op Vital signs reviewed and stable  Post vital signs: Reviewed and stable  Last Vitals:  Vitals Value Taken Time  BP    Temp    Pulse 96 11/14/20 1219  Resp 14 11/14/20 1219  SpO2 98 % 11/14/20 1219  Vitals shown include unvalidated device data.  Last Pain:  Vitals:   11/14/20 0849  TempSrc: Oral  PainSc: 4          Complications: No complications documented.

## 2020-11-14 NOTE — Interval H&P Note (Signed)
History and Physical Interval Note:  11/14/2020 10:38 AM  Jane [name]  has presented today for surgery, with the diagnosis of Left knee medial meniscus tear.  The various methods of treatment have been discussed with the patient and family. After consideration of risks, benefits and other options for treatment, the patient has consented to  Procedure(s): KNEE ARTHROSCOPY WITH MEDIAL MENISCAL REPAIR VS. MEDIAL MENISCECTOMY (Left) as a surgical intervention.  The patient's history has been reviewed, patient examined, no change in status, stable for surgery.  I have reviewed the patient's chart and labs.  Questions were answered to the patient's satisfaction.     Arther Abbott

## 2020-11-15 ENCOUNTER — Other Ambulatory Visit: Payer: Self-pay | Admitting: *Deleted

## 2020-11-15 ENCOUNTER — Ambulatory Visit: Payer: Self-pay

## 2020-11-15 ENCOUNTER — Telehealth: Payer: Self-pay | Admitting: Orthopedic Surgery

## 2020-11-15 DIAGNOSIS — I87002 Postthrombotic syndrome without complications of left lower extremity: Secondary | ICD-10-CM

## 2020-11-15 DIAGNOSIS — E039 Hypothyroidism, unspecified: Secondary | ICD-10-CM | POA: Diagnosis not present

## 2020-11-15 NOTE — Telephone Encounter (Signed)
Patient called to relay that she is in "a lot of pain" since her left knee surgery yesterday, 11/14/20. Please advise.

## 2020-11-15 NOTE — Telephone Encounter (Signed)
I called her told her the first couple days after surgery are the worst, to make sure she is using the pain meds, can't use NSAIDS since she is on the blood thinner  She states knee not painful, her inner thigh and groin are painful  Told her to ice that area as well and I would message you regarding this.

## 2020-11-16 ENCOUNTER — Encounter (HOSPITAL_COMMUNITY): Payer: Self-pay | Admitting: Orthopedic Surgery

## 2020-11-16 DIAGNOSIS — E039 Hypothyroidism, unspecified: Secondary | ICD-10-CM | POA: Diagnosis not present

## 2020-11-17 DIAGNOSIS — E039 Hypothyroidism, unspecified: Secondary | ICD-10-CM | POA: Diagnosis not present

## 2020-11-18 DIAGNOSIS — E039 Hypothyroidism, unspecified: Secondary | ICD-10-CM | POA: Diagnosis not present

## 2020-11-19 DIAGNOSIS — E039 Hypothyroidism, unspecified: Secondary | ICD-10-CM | POA: Diagnosis not present

## 2020-11-20 DIAGNOSIS — E039 Hypothyroidism, unspecified: Secondary | ICD-10-CM | POA: Diagnosis not present

## 2020-11-21 ENCOUNTER — Other Ambulatory Visit: Payer: Self-pay

## 2020-11-21 ENCOUNTER — Telehealth (INDEPENDENT_AMBULATORY_CARE_PROVIDER_SITE_OTHER): Payer: Medicaid Other | Admitting: Psychiatry

## 2020-11-21 ENCOUNTER — Telehealth: Payer: Self-pay

## 2020-11-21 ENCOUNTER — Encounter (HOSPITAL_COMMUNITY): Payer: Self-pay | Admitting: Psychiatry

## 2020-11-21 DIAGNOSIS — F3130 Bipolar disorder, current episode depressed, mild or moderate severity, unspecified: Secondary | ICD-10-CM | POA: Diagnosis not present

## 2020-11-21 DIAGNOSIS — F900 Attention-deficit hyperactivity disorder, predominantly inattentive type: Secondary | ICD-10-CM

## 2020-11-21 DIAGNOSIS — E039 Hypothyroidism, unspecified: Secondary | ICD-10-CM | POA: Diagnosis not present

## 2020-11-21 DIAGNOSIS — Z9889 Other specified postprocedural states: Secondary | ICD-10-CM | POA: Insufficient documentation

## 2020-11-21 MED ORDER — FLUOXETINE HCL 40 MG PO CAPS
ORAL_CAPSULE | ORAL | 2 refills | Status: DC
Start: 2020-11-21 — End: 2021-02-26

## 2020-11-21 MED ORDER — AMPHETAMINE-DEXTROAMPHETAMINE 30 MG PO TABS: 30.0000 mg | ORAL_TABLET | Freq: Two times a day (BID) | ORAL | 0 refills | Status: DC

## 2020-11-21 MED ORDER — ALPRAZOLAM 1 MG PO TABS: 1.0000 mg | ORAL_TABLET | Freq: Two times a day (BID) | ORAL | 2 refills | Status: DC | PRN

## 2020-11-21 NOTE — Progress Notes (Signed)
Virtual Visit via Video Note  I connected with Jane [name] on 11/21/20 at 11:00 AM EST by a video enabled telemedicine application and verified that I am speaking with the correct person using two identifiers.  Location: Patient: home Provider: home   I discussed the limitations of evaluation and management by telemedicine and the availability of in person appointments. The patient expressed understanding and agreed to proceed.    I discussed the assessment and treatment plan with the patient. The patient was provided an opportunity to ask questions and all were answered. The patient agreed with the plan and demonstrated an understanding of the instructions.   The patient was advised to call back or seek an in-person evaluation if the symptoms worsen or if the condition fails to improve as anticipated.  I provided 15 minutes of non-face-to-face time during this encounter.   [name], MD  [facility] MD/PA/NP OP Progress Note  11/21/2020 11:02 AM Jane [name]  MRN:  [id number]  Chief Complaint:  Chief Complaint    ADD; Anxiety; Depression; Follow-up     HPI: This patient is a 55year-old divorced white female who lives alone in [place]. She is on disability due to bipolar disorder.  The patient states that she became very depressed when her mother died in Mar 05, 2000 of a brain aneurysm. She started getting. Sad but eventually became manic and develop mood swings and agitated behavior. She's been on numerous medications over the years. In 03-05-88 she also had postpartum depression. For many years she was on lithium but it caused a bad taste in her mouth and her doctor was worried it might be starting to affect her renal function so was discontinued. She also thinks that it stopped working after a number of years  Patient returns for follow-up after 3 months.  She states that she had left knee arthroscopy last week and she is still obviously recovering.  She is walking okay with a walker.  2 of  her daughters have been checking on her she does not speak to the third daughter.  Her mood is generally been stable and she denies serious depression.  Xanax continues to help with her anxiety and the Adderall continues to help her stay focused.  She denies any thoughts of self-harm or suicidal ideation.  She denies any manic symptoms. Visit Diagnosis:    ICD-10-CM   1. Bipolar I disorder, most recent episode depressed ([place])  F31.30   2. Attention deficit hyperactivity disorder (ADHD), predominantly inattentive type  F90.0     Past Psychiatric History: Long-term outpatient treatment  Past Medical History:  Past Medical History:  Diagnosis Date  . ADD (attention deficit disorder)   . Anxiety   . Back pain   . Bilateral swelling of feet   . Bipolar 1 disorder ([place])   . Bipolar 1 disorder ([place])   . Bipolar disorder ([place])   . Chewing difficulty   . Chronic fatigue syndrome   . Chronic kidney disease    Stage 3 kidney disease;dx by Dr. Sinda Du.   . Constipation   . Depression   . Diabetes mellitus without complication (HCC)    diet controlled  . Dyspnea    with exertion  . GERD (gastroesophageal reflux disease)   . Headache    migraines  . High cholesterol   . History of blood clots   . History of DVT (deep vein thrombosis)    left leg  . Hypertension    states under control with meds., has  been on med. x 2 years  . Hypothyroidism   . Joint pain   . Neuropathy   . Obsessive-compulsive disorder   . Peripheral vascular disease ([place])   . Prediabetes   . Respiratory failure requiring intubation ([place])   . Restless leg syndrome   . Shortness of breath   . Sleep apnea   . Thrombocytopenia ([place]) 09/15/2019  . Trigger thumb of left hand 01/2018  . Trigger thumb of right hand     Past Surgical History:  Procedure Laterality Date  . ABDOMINAL HYSTERECTOMY  06/2016   complete  . APPLICATION OF WOUND VAC Left 04/27/2019   Procedure: APPLICATION OF WOUND VAC LEFT GROIN;   Surgeon: Waynetta Sandy, MD;  Location: [place];  Service: Vascular;  Laterality: Left;  . AV FISTULA PLACEMENT Left 11/24/2018   Procedure: ARTERIOVENOUS (AV) FISTULA CREATION LEFT SFA TO LEFT FEMORAL VEIN;  Surgeon: Waynetta Sandy, MD;  Location: [place];  Service: Vascular;  Laterality: Left;  . BACK SURGERY    . CHOLECYSTECTOMY    . COLONOSCOPY WITH PROPOFOL N/A 11/22/2019   Procedure: COLONOSCOPY WITH PROPOFOL;  Surgeon: [name], MD;  Location: [facility] ENDOSCOPY;  Service: Gastroenterology;  Laterality: N/A;  . FEMORAL ARTERY EXPLORATION  03/30/2019   Procedure: Left Common Femoral Artery and Vein Exploration;  Surgeon: Waynetta Sandy, MD;  Location: [place];  Service: Vascular;;  . FEMORAL-FEMORAL BYPASS GRAFT Left 11/24/2018   Procedure: BYPASS GRAFT FEMORAL-FEMORAL VENOUS LEFT TO RIGHT PALMA PROCEDURE USING CRYOVEIN;  Surgeon: Waynetta Sandy, MD;  Location: [place];  Service: Vascular;  Laterality: Left;  . GROIN DEBRIDEMENT Left 04/27/2019   Procedure: GROIN DEBRIDEMENT;  Surgeon: Waynetta Sandy, MD;  Location: [place];  Service: Vascular;  Laterality: Left;  . INSERTION OF ILIAC STENT  03/30/2019   Procedure: Stent of left common, external iliac veins and left common femoral vein;  Surgeon: Waynetta Sandy, MD;  Location: [place];  Service: Vascular;;  . KNEE ARTHROSCOPY WITH MENISCAL REPAIR Left 11/14/2020   Procedure: LEFT KNEE ARTHROSCOPY WITH PARTIAL MEDIAL MENISCECTOMY;  Surgeon: Carole Civil, MD;  Location: [facility];  Service: Orthopedics;  Laterality: Left;  . LOWER EXTREMITY VENOGRAPHY N/A 08/17/2018   Procedure: LOWER EXTREMITY VENOGRAPHY - Central Venogram;  Surgeon: Waynetta Sandy, MD;  Location: [place] CV LAB;  Service: Cardiovascular;  Laterality: N/A;  . LOWER EXTREMITY VENOGRAPHY Bilateral 03/09/2019   Procedure: LOWER EXTREMITY VENOGRAPHY;  Surgeon: Waynetta Sandy, MD;  Location: [place] [facility]  LAB;  Service: Cardiovascular;  Laterality: Bilateral;  . LOWER EXTREMITY VENOGRAPHY Left 08/16/2019   Procedure: LOWER EXTREMITY VENOGRAPHY;  Surgeon: Waynetta Sandy, MD;  Location: [place] CV LAB;  Service: Cardiovascular;  Laterality: Left;  . LUMBAR FUSION  11/21/2000   L5-S1  . LUMBAR SPINE SURGERY     x 2 others  . PATCH ANGIOPLASTY Left 03/30/2019   Procedure: Patch Angioplasty of the Left Common Femoral Vein using Venosure Biologic patch;  Surgeon: Waynetta Sandy, MD;  Location: [place];  Service: Vascular;  Laterality: Left;  . PERIPHERAL VASCULAR INTERVENTION Left 08/16/2019   Procedure: PERIPHERAL VASCULAR INTERVENTION;  Surgeon: Waynetta Sandy, MD;  Location: [place] CV LAB;  Service: Cardiovascular;  Laterality: Left;  common femoral/femoral vein stent  . TRIGGER FINGER RELEASE Right 12/01/2017   Procedure: RELEASE TRIGGER FINGER/A-1 PULLEY RIGHT THUMB;  Surgeon: Leanora Cover, MD;  Location: [place];  Service: Orthopedics;  Laterality: Right;  .  TRIGGER FINGER RELEASE Left 01/26/2018   Procedure: LEFT TRIGGER THUMB RELEASE;  Surgeon: Leanora Cover, MD;  Location: [place];  Service: Orthopedics;  Laterality: Left;  . ULTRASOUND GUIDANCE FOR VASCULAR ACCESS Right 03/30/2019   Procedure: Ultrasound-guided cannulation right internal jugular vein;  Surgeon: Waynetta Sandy, MD;  Location: [facility] OR;  Service: Vascular;  Laterality: Right;    Family Psychiatric History: See below  Family History:  Family History  Problem Relation Age of Onset  . Heart disease Mother   . Hyperlipidemia Mother   . Hypertension Mother   . Bipolar disorder Mother   . Stroke Mother   . Depression Mother   . Sleep apnea Mother   . Obesity Mother   . Diabetes Father   . Heart disease Father   . Hyperlipidemia Father   . Hypertension Father   . Sleep apnea Father   . Obesity Father   . Drug abuse Daughter   . ADD / ADHD  Daughter   . Drug abuse Daughter   . Anxiety disorder Daughter   . Bipolar disorder Daughter   . Hypertension Sister   . Hypertension Brother   . Hyperlipidemia Brother   . Heart disease Brother   . Bipolar disorder Maternal Aunt   . Suicidality Maternal Aunt     Social History:  Social History   Socioeconomic History  . Marital status: Divorced    Spouse name: Not on file  . Number of children: 3  . Years of education: Not on file  . Highest education level: Not on file  Occupational History  . Not on file  Tobacco Use  . Smoking status: Never Smoker  . Smokeless tobacco: Never Used  Vaping Use  . Vaping Use: Never used  Substance and Sexual Activity  . Alcohol use: No  . Drug use: No  . Sexual activity: Not Currently    Partners: Male    Birth control/protection: Surgical  Other Topics Concern  . Not on file  Social History Narrative  . Not on file   Social Determinants of Health   Financial Resource Strain: High Risk  . Difficulty of Paying Living Expenses: Hard  Food Insecurity: Food Insecurity Present  . Worried About Charity fundraiser in the Last Year: Sometimes true  . Ran Out of Food in the Last Year: Never true  Transportation Needs: No Transportation Needs  . Lack of Transportation (Medical): No  . Lack of Transportation (Non-Medical): No  Physical Activity: Inactive  . Days of Exercise per Week: 0 days  . Minutes of Exercise per Session: 0 min  Stress: Stress Concern Present  . Feeling of Stress : Very much  Social Connections: Socially Isolated  . Frequency of Communication with Friends and Family: More than three times a week  . Frequency of Social Gatherings with Friends and Family: Three times a week  . Attends Religious Services: Never  . Active Member of Clubs or Organizations: No  . Attends Archivist Meetings: Never  . Marital Status: Divorced    Allergies:  Allergies  Allergen Reactions  . Aripiprazole Other (See  Comments)    BECOMES  VIOLENT   . Seroquel [Quetiapine Fumarate] Other (See Comments)    BECOMES VIOLENT  . Chlorpromazine Other (See Comments)    SEVERE ANXIETY   . Gabapentin Other (See Comments)    NIGHTMARES     Metabolic Disorder Labs: Lab Results  Component Value Date   HGBA1C 5.8 (A) 10/24/2020  MPG 134.11 03/26/2019   No results found for: PROLACTIN Lab Results  Component Value Date   CHOL 203 (H) 03/29/2020   TRIG 296 (H) 03/29/2020   HDL 37 (L) 03/29/2020   CHOLHDL 5.0 (H) 10/25/2019   LDLCALC 114 (H) 03/29/2020   LDLCALC 127 (H) 10/25/2019   Lab Results  Component Value Date   TSH 0.107 (L) 05/18/2020   TSH 0.275 (L) 03/29/2020    Therapeutic Level Labs: No results found for: LITHIUM Lab Results  Component Value Date   VALPROATE 61.2 03/23/2014   No components found for:  CBMZ  Current Medications: Current Outpatient Medications  Medication Sig Dispense Refill  . amphetamine-dextroamphetamine (ADDERALL) 30 MG tablet Take 1 tablet by mouth 2 (two) times daily. 90 tablet 0  . amphetamine-dextroamphetamine (ADDERALL) 30 MG tablet Take 1 tablet by mouth 2 (two) times daily. 60 tablet 0  . ALPRAZolam (XANAX) 1 MG tablet Take 1 tablet (1 mg total) by mouth 2 (two) times daily as needed for anxiety. 60 tablet 2  . amphetamine-dextroamphetamine (ADDERALL) 30 MG tablet Take 1 tablet by mouth 2 (two) times daily. 60 tablet 0  . aspirin EC 81 MG tablet Take 81 mg by mouth daily.    Marland Kitchen atorvastatin (LIPITOR) 20 MG tablet Take 1 tablet (20 mg total) by mouth daily. 90 tablet 3  . cetirizine (ZYRTEC) 10 MG tablet TAKE 1 TABLET BY MOUTH DAILY (Patient taking differently: Take 10 mg by mouth daily.) 90 tablet 1  . cycloSPORINE (RESTASIS) 0.05 % ophthalmic emulsion Place 1 drop into both eyes 2 (two) times daily.    Marland Kitchen FLUoxetine (PROZAC) 40 MG capsule TAKE 1 TABLET BY MOUTH DAILY 90 capsule 2  . GARLIC PO Take [id number] mg by mouth daily.    . hydrochlorothiazide  (HYDRODIURIL) 25 MG tablet Take 1 tablet (25 mg total) by mouth daily. 30 tablet 0  . HYDROcodone-acetaminophen (NORCO) 10-325 MG tablet Take 1 tablet by mouth every 6 (six) hours as needed for up to 7 days. 28 tablet 0  . levothyroxine (SYNTHROID) 125 MCG tablet Take 125 mcg by mouth daily before breakfast.    . linaclotide (LINZESS) 145 MCG CAPS capsule Take 1 capsule (145 mcg total) by mouth daily. 90 capsule 3  . methocarbamol (ROBAXIN) 750 MG tablet Take 1 tablet (750 mg total) by mouth 2 (two) times daily as needed for muscle spasms. 60 tablet 2  . Multiple Vitamin (MULTIVITAMIN) capsule Take 1 capsule by mouth daily.    . Olopatadine HCl 0.2 % SOLN Place 1 drop into both eyes daily.    . Omega-3 Fatty Acids (OMEGA-3 FISH OIL PO) Take 600 mg by mouth daily.    . pantoprazole (PROTONIX) 40 MG tablet Take 1 tablet (40 mg total) by mouth daily. 90 tablet 3  . potassium chloride (KLOR-CON) 10 MEQ tablet Take 1 tablet (10 mEq total) by mouth 3 (three) times daily. 270 tablet 3  . pregabalin (LYRICA) 100 MG capsule Take 100 mg by mouth 4 (four) times daily.    Marland Kitchen rOPINIRole (REQUIP) 2 MG tablet Take 1 tablet (2 mg total) by mouth 2 (two) times daily. 180 tablet 3  . Vitamin D, Ergocalciferol, (DRISDOL) 1.25 MG (50000 UNIT) CAPS capsule Take 1 capsule (50,000 Units total) by mouth every 7 (seven) days. 4 capsule 0  . warfarin (COUMADIN) 5 MG tablet TAKE 1 AND 1/2 TABLETS BY MOUTH ON TUEDAYS AND THURSDAYS, THEN TAKE 1 TABLET DAILY ON ALL OTHER DAYS (Patient taking differently: Take  5 mg by mouth See admin instructions. Mon and Fri 2.5 mg, all other days 5 mg) 60 tablet 1   No current facility-administered medications for this visit.     Musculoskeletal: Strength & Muscle Tone: decreased Gait & Station: unsteady Patient leans: N/A  Psychiatric Specialty Exam: Review of Systems  Musculoskeletal: Positive for arthralgias and joint swelling.  All other systems reviewed and are negative.   There  were no vitals taken for this visit.There is no height or weight on file to calculate BMI.  General Appearance: Casual and Fairly Groomed  Eye Contact:  Good  Speech:  Clear and Coherent  Volume:  Normal  Mood:  Euthymic  Affect:  Appropriate and Congruent  Thought Process:  Goal Directed  Orientation:  Full (Time, Place, and Person)  Thought Content: Rumination   Suicidal Thoughts:  No  Homicidal Thoughts:  No  Memory:  Immediate;   Good Recent;   Good Remote;   Fair  Judgement:  Good  Insight:  Fair  Psychomotor Activity:  Decreased  Concentration:  Concentration: Good and Attention Span: Good  Recall:  Good  Fund of Knowledge: Good  Language: Good  Akathisia:  No  Handed:  Right  AIMS (if indicated): not done  Assets:  Communication Skills Desire for Improvement Resilience Social Support Talents/Skills  ADL's:  Intact  Cognition: WNL  Sleep:  Fair   Screenings: PHQ2-9   [place] Office Visit from 10/24/2020 in [place] Office Visit from 10/05/2020 in [place] Office Visit from 07/11/2020 in [place] Office Visit from 05/16/2020 in [place] Office Visit from 04/24/2020 in [place]  PHQ-2 Total Score 2 0 0 0 0  PHQ-9 Total Score 8 -- -- -- --       Assessment and Plan: This patient is a 55 year old female with a history of depression anxiety and ADD.  She seems to be stable despite recent medical issues.  She will continue Prozac 40 mg daily for depression, Xanax 1 mg twice daily as needed for anxiety and Adderall 30 mg twice daily for ADD.  She will return to see me in 3 months   [name], MD 11/21/2020, 11:02 AM

## 2020-11-21 NOTE — Telephone Encounter (Signed)
Pt. States that she had an Rx that has not been filled because it requires dr. Etheleen Mayhew.

## 2020-11-21 NOTE — Telephone Encounter (Signed)
Hi, was this message intended for Pain Management? I don't see that we have prescribed anything for her in a while.Marland Kitchen

## 2020-11-21 NOTE — Telephone Encounter (Signed)
For her norco you used the wrong form for prior auth. They want the short acting opiod form instead.

## 2020-11-22 ENCOUNTER — Ambulatory Visit (INDEPENDENT_AMBULATORY_CARE_PROVIDER_SITE_OTHER): Payer: Medicaid Other

## 2020-11-22 ENCOUNTER — Ambulatory Visit (INDEPENDENT_AMBULATORY_CARE_PROVIDER_SITE_OTHER): Payer: Medicaid Other | Admitting: Orthopedic Surgery

## 2020-11-22 ENCOUNTER — Ambulatory Visit (HOSPITAL_COMMUNITY)
Admission: RE | Admit: 2020-11-22 | Discharge: 2020-11-22 | Disposition: A | Discharge status: Home / Self Care | Payer: Medicaid Other | Source: Ambulatory Visit | Attending: Orthopedic Surgery | Admitting: Orthopedic Surgery

## 2020-11-22 ENCOUNTER — Other Ambulatory Visit: Payer: Self-pay

## 2020-11-22 DIAGNOSIS — M79662 Pain in left lower leg: Secondary | ICD-10-CM

## 2020-11-22 DIAGNOSIS — Z9889 Other specified postprocedural states: Secondary | ICD-10-CM

## 2020-11-22 DIAGNOSIS — M79661 Pain in right lower leg: Secondary | ICD-10-CM | POA: Diagnosis not present

## 2020-11-22 DIAGNOSIS — E039 Hypothyroidism, unspecified: Secondary | ICD-10-CM | POA: Diagnosis not present

## 2020-11-22 DIAGNOSIS — M79605 Pain in left leg: Secondary | ICD-10-CM | POA: Diagnosis not present

## 2020-11-22 DIAGNOSIS — Z86718 Personal history of other venous thrombosis and embolism: Secondary | ICD-10-CM | POA: Diagnosis not present

## 2020-11-22 DIAGNOSIS — R600 Localized edema: Secondary | ICD-10-CM | POA: Diagnosis not present

## 2020-11-22 LAB — POCT INR
INR: 1.2 — AB (ref 2.0–3.0)
PT: 14.5

## 2020-11-22 NOTE — Telephone Encounter (Signed)
Lori took care of this.

## 2020-11-22 NOTE — Progress Notes (Signed)
Patients sutures removed without difficulty Has history of DVT  Complains of left leg swelling calf tenderness pain with gentle squeeze of calf reports INR is 1.2 now, restarting coumadin today  Discussed with Dr Aline Brochure, patient sent to have [place] to r/o DVT

## 2020-11-22 NOTE — Patient Instructions (Signed)
Description    5mg  daily  F/U 1 week

## 2020-11-23 DIAGNOSIS — E039 Hypothyroidism, unspecified: Secondary | ICD-10-CM | POA: Diagnosis not present

## 2020-11-24 DIAGNOSIS — E039 Hypothyroidism, unspecified: Secondary | ICD-10-CM | POA: Diagnosis not present

## 2020-11-25 DIAGNOSIS — E039 Hypothyroidism, unspecified: Secondary | ICD-10-CM | POA: Diagnosis not present

## 2020-11-26 DIAGNOSIS — E039 Hypothyroidism, unspecified: Secondary | ICD-10-CM | POA: Diagnosis not present

## 2020-11-27 DIAGNOSIS — E039 Hypothyroidism, unspecified: Secondary | ICD-10-CM | POA: Diagnosis not present

## 2020-11-28 DIAGNOSIS — E039 Hypothyroidism, unspecified: Secondary | ICD-10-CM | POA: Diagnosis not present

## 2020-11-28 DIAGNOSIS — J0100 Acute maxillary sinusitis, unspecified: Secondary | ICD-10-CM | POA: Diagnosis not present

## 2020-11-28 DIAGNOSIS — R350 Frequency of micturition: Secondary | ICD-10-CM | POA: Diagnosis not present

## 2020-11-29 ENCOUNTER — Ambulatory Visit (INDEPENDENT_AMBULATORY_CARE_PROVIDER_SITE_OTHER): Payer: Medicaid Other | Admitting: Orthopedic Surgery

## 2020-11-29 ENCOUNTER — Ambulatory Visit (INDEPENDENT_AMBULATORY_CARE_PROVIDER_SITE_OTHER): Payer: Medicaid Other

## 2020-11-29 ENCOUNTER — Other Ambulatory Visit: Payer: Self-pay

## 2020-11-29 DIAGNOSIS — Z86718 Personal history of other venous thrombosis and embolism: Secondary | ICD-10-CM | POA: Diagnosis not present

## 2020-11-29 DIAGNOSIS — E039 Hypothyroidism, unspecified: Secondary | ICD-10-CM | POA: Diagnosis not present

## 2020-11-29 DIAGNOSIS — Z9889 Other specified postprocedural states: Secondary | ICD-10-CM

## 2020-11-29 DIAGNOSIS — Z4889 Encounter for other specified surgical aftercare: Secondary | ICD-10-CM

## 2020-11-29 LAB — POCT INR
INR: 1.7 — AB (ref 2.0–3.0)
PT: 20.2

## 2020-11-29 NOTE — Progress Notes (Signed)
POST OP VISIT   Chief Complaint  Patient presents with  . Knee Pain    L/DOS 11/14/20/Still sore and having some pressure    Encounter Diagnoses  Name Primary?  . S/P left knee arthroscopy 11/14/20  Yes  . 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     Had U/S for calf pain ; it was normal   ASSESSMENT AND PLAN   Continue exercise and continue ice follow-up in 3 weeks

## 2020-11-29 NOTE — Patient Instructions (Signed)
Description    10 mg x 2 days then, 5mg  daily.  F/U 2 week

## 2020-11-30 DIAGNOSIS — E039 Hypothyroidism, unspecified: Secondary | ICD-10-CM | POA: Diagnosis not present

## 2020-12-01 DIAGNOSIS — E039 Hypothyroidism, unspecified: Secondary | ICD-10-CM | POA: Diagnosis not present

## 2020-12-02 DIAGNOSIS — E039 Hypothyroidism, unspecified: Secondary | ICD-10-CM | POA: Diagnosis not present

## 2020-12-03 DIAGNOSIS — R0602 Shortness of breath: Secondary | ICD-10-CM | POA: Diagnosis not present

## 2020-12-03 DIAGNOSIS — R079 Chest pain, unspecified: Secondary | ICD-10-CM | POA: Diagnosis not present

## 2020-12-03 DIAGNOSIS — R0600 Dyspnea, unspecified: Secondary | ICD-10-CM | POA: Diagnosis not present

## 2020-12-03 DIAGNOSIS — R0789 Other chest pain: Secondary | ICD-10-CM | POA: Diagnosis not present

## 2020-12-03 DIAGNOSIS — J189 Pneumonia, unspecified organism: Secondary | ICD-10-CM | POA: Diagnosis not present

## 2020-12-03 DIAGNOSIS — R509 Fever, unspecified: Secondary | ICD-10-CM | POA: Diagnosis not present

## 2020-12-03 DIAGNOSIS — J1282 Pneumonia due to coronavirus disease 2019: Secondary | ICD-10-CM | POA: Insufficient documentation

## 2020-12-03 DIAGNOSIS — U071 COVID-19: Secondary | ICD-10-CM | POA: Diagnosis not present

## 2020-12-03 DIAGNOSIS — R0902 Hypoxemia: Secondary | ICD-10-CM | POA: Diagnosis not present

## 2020-12-03 DIAGNOSIS — R0689 Other abnormalities of breathing: Secondary | ICD-10-CM | POA: Diagnosis not present

## 2020-12-04 ENCOUNTER — Telehealth: Payer: Self-pay

## 2020-12-04 DIAGNOSIS — N182 Chronic kidney disease, stage 2 (mild): Secondary | ICD-10-CM | POA: Diagnosis not present

## 2020-12-04 DIAGNOSIS — U071 COVID-19: Secondary | ICD-10-CM | POA: Diagnosis not present

## 2020-12-04 DIAGNOSIS — E119 Type 2 diabetes mellitus without complications: Secondary | ICD-10-CM | POA: Diagnosis not present

## 2020-12-04 DIAGNOSIS — J1282 Pneumonia due to coronavirus disease 2019: Secondary | ICD-10-CM | POA: Diagnosis not present

## 2020-12-04 NOTE — Telephone Encounter (Signed)
PA sent

## 2020-12-04 NOTE — Telephone Encounter (Signed)
Insurance will cancel 12/09/2020, Pt was wondering if she could get her prior authorization corrected from the last visit her insurance will not cover the visit she said the insurance said it was filled out incorrectly and they will not cover it.

## 2020-12-04 NOTE — Telephone Encounter (Signed)
Called patient, she is currently admitted to the ED for pneumonia. States PA was sent wrong. Will look into this.

## 2020-12-05 ENCOUNTER — Telehealth: Payer: Self-pay

## 2020-12-05 DIAGNOSIS — E039 Hypothyroidism, unspecified: Secondary | ICD-10-CM | POA: Diagnosis not present

## 2020-12-05 NOTE — Telephone Encounter (Signed)
Scheduled with Adriana tomorrow AM, but if remains hypoxic, may need to go back to ER (could chose a different one).

## 2020-12-05 NOTE — Telephone Encounter (Signed)
Jane [name] called reporting what is stated below, just wanted to make sure PCP was aware  Best contact: [phone]

## 2020-12-05 NOTE — Telephone Encounter (Signed)
Copied from [place] [phone]. Topic: Quick Communication - See Telephone Encounter >> Dec 05, 2020 10:26 AM Loma [place] wrote: CRM for notification. See Telephone encounter for: 12/05/20. [facility] [place].   Pt went in by ambulance having sym, fever since Sunday week, having chest pain, Test positve Covid and pneumonia and did not feel getting good care. Pt checked herself out last nite on her own. She is now seeking advice from Dr B. Pt states feels Blood sugar is high, because is having the shakes? Clarified with pt she thinks having shakes is high for her but states Hospital was giving her extra insulin. In hospital her oxygen was 88. Was on oxygen but they took her off and she was wanting oxygen back as running 88. Contact pt asap is request [phone]

## 2020-12-05 NOTE — Telephone Encounter (Signed)
Patient scheduled for 12/06/20

## 2020-12-05 NOTE — Telephone Encounter (Signed)
Sounds like she needs virtual visit to reassess shortness of breath and hypoxia. If she has access to pulse ox, should check spO2.

## 2020-12-05 NOTE — Progress Notes (Signed)
MyChart Video Visit    Virtual Visit via Video Note   This visit type was conducted due to national recommendations for restrictions regarding the COVID-19 Pandemic (e.g. social distancing) in an effort to limit this patient's exposure and mitigate transmission in our community. This patient is at least at moderate risk for complications without adequate follow up. This format is felt to be most appropriate for this patient at this time. Physical exam was limited by quality of the video and audio technology used for the visit.   Patient location: Home Provider location: Office  I discussed the limitations of evaluation and management by telemedicine and the availability of in person appointments. The patient expressed understanding and agreed to proceed.  Patient: Jane [name]   DOB: February 01, 1966   55 y.o. Female  MRN: [id number] Visit Date: 12/06/2020  Today's healthcare provider: Trinna Post, PA-C   Chief Complaint  Patient presents with  . Hospitalization Follow-up  I,Porsha C McClurkin,acting as a scribe for Trinna Post, PA-C.,have documented all relevant documentation on the behalf of Trinna Post, PA-C,as directed by  Trinna Post, PA-C while in the presence of Trinna Post, PA-C.  Subjective    HPI  Follow up Hospitalization  Patient was admitted to [facility] on 12/03/2020 and discharged on 12/04/2020 for COVID pneumonia. She had a CXR that showed bilateral multifocal patchy PNA consistent with COVID pneumonia. The following CXR is from 12/03/2020  FINDINGS:  Patchy bilateral airspace opacities, RIGHT greater than LEFT. No  pleural effusion or pneumothorax is seen. Heart size and mediastinal  contours are stable. Osseous structures are unremarkable.  Blood gas as below:     Treatment included the following:     She became dissatisfied with her care, citing issues such as the room being unclean, not being able to see a doctor when  requested, not getting medicines and grilled cheese being late. She stayed a total of one day.  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 She reports feeling short of breath and chest tightness. She does not have a glucometer or a pulse ox.    Telephone follow up was done on 12/05/2020. She reports poor compliance with treatment. She reports this condition is stayed the same.  ----------------------------------------------------------------------------------------      Medications: Outpatient Medications Prior to Visit  Medication Sig  . ALPRAZolam (XANAX) 1 MG tablet Take 1 tablet (1 mg total) by mouth 2 (two) times daily as needed for anxiety.  Marland Kitchen amphetamine-dextroamphetamine (ADDERALL) 30 MG tablet Take 1 tablet by mouth 2 (two) times daily.  Marland Kitchen amphetamine-dextroamphetamine (ADDERALL) 30 MG tablet Take 1 tablet by mouth 2 (two) times daily.  Marland Kitchen amphetamine-dextroamphetamine (ADDERALL) 30 MG tablet Take 1 tablet by mouth 2 (two) times daily.  Marland Kitchen aspirin EC 81 MG tablet Take 81 mg by mouth daily.  Marland Kitchen atorvastatin (LIPITOR) 20 MG tablet Take 1 tablet (20 mg total) by mouth daily.  . cetirizine (ZYRTEC) 10 MG tablet TAKE 1 TABLET BY MOUTH DAILY (Patient taking differently: Take 10 mg by mouth daily.)  . cycloSPORINE (RESTASIS) 0.05 % ophthalmic emulsion Place 1 drop into both eyes 2 (two) times daily.  Marland Kitchen FLUoxetine (PROZAC) 40 MG capsule TAKE 1 TABLET BY MOUTH DAILY  . GARLIC PO Take 4,174 mg by mouth daily.  . hydrochlorothiazide (HYDRODIURIL) 25 MG tablet Take 1 tablet (  25 mg total) by mouth daily.  Marland Kitchen levothyroxine (SYNTHROID) 125 MCG tablet Take 125 mcg by mouth daily before breakfast.  . linaclotide (LINZESS) 145 MCG CAPS capsule Take 1 capsule (145 mcg total) by mouth daily.  . methocarbamol (ROBAXIN) 750 MG tablet Take 1 tablet  (750 mg total) by mouth 2 (two) times daily as needed for muscle spasms.  . Multiple Vitamin (MULTIVITAMIN) capsule Take 1 capsule by mouth daily.  . Olopatadine HCl 0.2 % SOLN Place 1 drop into both eyes daily.  . Omega-3 Fatty Acids (OMEGA-3 FISH OIL PO) Take 600 mg by mouth daily.  . pantoprazole (PROTONIX) 40 MG tablet Take 1 tablet (40 mg total) by mouth daily.  . potassium chloride (KLOR-CON) 10 MEQ tablet Take 1 tablet (10 mEq total) by mouth 3 (three) times daily.  . pregabalin (LYRICA) 100 MG capsule Take 100 mg by mouth 4 (four) times daily.  Marland Kitchen rOPINIRole (REQUIP) 2 MG tablet Take 1 tablet (2 mg total) by mouth 2 (two) times daily.  . Vitamin D, Ergocalciferol, (DRISDOL) 1.25 MG (50000 UNIT) CAPS capsule Take 1 capsule (50,000 Units total) by mouth every 7 (seven) days.  Marland Kitchen warfarin (COUMADIN) 5 MG tablet TAKE 1 AND 1/2 TABLETS BY MOUTH ON TUEDAYS AND THURSDAYS, THEN TAKE 1 TABLET DAILY ON ALL OTHER DAYS (Patient taking differently: Take 5 mg by mouth See admin instructions. Mon and Fri 2.5 mg, all other days 5 mg)   No facility-administered medications prior to visit.    Review of Systems  Constitutional: Positive for appetite change and fatigue. Negative for chills and fever.  HENT: Positive for congestion and sinus pressure. Negative for ear pain, postnasal drip, rhinorrhea, sinus pain, sneezing and sore throat.   Respiratory: Positive for chest tightness, shortness of breath and wheezing. Negative for cough.   Cardiovascular: Positive for chest pain (when taking a deep breath per patient.).  Gastrointestinal: Positive for diarrhea and nausea. Negative for vomiting.  Neurological: Positive for weakness. Negative for headaches.      Objective    LMP  (LMP Unknown)    Physical Exam Constitutional:      Appearance: Normal appearance. She is not ill-appearing.  Pulmonary:     Effort: Pulmonary effort is normal. No respiratory distress.     Comments: Speaking in complete  sentences without pausing for breath.  Neurological:     Mental Status: She is alert and oriented to person, place, and time. Mental status is at baseline.  Psychiatric:        Mood and Affect: Mood normal.        Behavior: Behavior normal.        Assessment & Plan    1. COVID-19  I have been very explicit with patient about the limitations of my care on an outpatient basis. She is unvaccinated, obese, with multiple comorbidities including DM, CKD III, HTN, Hypothyroidism, chronic anticoagulation status. She has bilateral multifocal pneumonia and is at risk for rapid deterioration and death. I have explained that I think she needs to be evaluated in the hospital and that outpatient care is insufficient for her needs. She has declined to return to the hospital. Unable to drive to respiratory clinic. Considering this, will repeat CXR to evaluate for progression, repeat labs. Will send in prednisone burst and albuterol inhaler. Have faxed order to pharmacy in [place] for glucometer and pulse ox. Will defer antibiotics as pneumonia likely viral and I do not have an adequate way to monitor the interaction with her  warfarin. She will follow up virtually on Friday. Have advised if she worsens she should be seen in the ER.   - DG Chest 2 View; Future - predniSONE (DELTASONE) 20 MG tablet; Take 1 tablet (20 mg total) by mouth daily with breakfast for 5 days.  Dispense: 5 tablet; Refill: 0 - Comprehensive Metabolic Panel (CMET) - CBC with Differential  2. CKD stage 3 due to type 2 diabetes mellitus ([place])   3. Type 2 diabetes mellitus with stage 3a chronic kidney disease, without long-term current use of insulin ([place])   4. Pneumonia due to COVID-19 virus  - DG Chest 2 View; Future - albuterol (VENTOLIN HFA) 108 (90 Base) MCG/ACT inhaler; Inhale 2 puffs into the lungs every 6 (six) hours as needed for wheezing or shortness of breath.  Dispense: 1 each; Refill: 2  5. May-Thurner syndrome  -  INR/PT   No follow-ups on file.     I discussed the assessment and treatment plan with the patient. The patient was provided an opportunity to ask questions and all were answered. The patient agreed with the plan and demonstrated an understanding of the instructions.   The patient was advised to call back or seek an in-person evaluation if the symptoms worsen or if the condition fails to improve as anticipated.   ITrinna Post, PA-C, have reviewed all documentation for this visit. The documentation on 12/06/20 for the exam, diagnosis, procedures, and orders are all accurate and complete.  The entirety of the information documented in the History of Present Illness, Review of Systems and Physical Exam were personally obtained by me. Portions of this information were initially documented by [facility] and reviewed by me for thoroughness and accuracy.    Paulene Floor [facility] [phone] (phone) [phone] (fax)  [place]

## 2020-12-05 NOTE — Telephone Encounter (Signed)
Transition Care Management Follow-up Telephone Call  Date of discharge and from where: 12/04/2020 from [facility]  How have you been since you were released from the hospital? Pt was dx with COVID pneumonia and was given some IV abx. Pt stated that her O2 was at 88% when she left the ED. Pt is having some pain with she is breathing but does not sound to be experiencing any dysnea at the time of the call.   Pt did complete the recommended treatment at [facility] ED. Notes from visit and lab work/imaging results are in [place]. Pt may benefit from a mobile xray if she is not able to be seen in office due to recent COVID positive result.   Mobile Xray   Ph: [phone] Fx: [phone]     Any questions or concerns? Yes Pt was not given any dc instructions.   Items Reviewed:  Did the pt receive and understand the discharge instructions provided? No   Medications obtained and verified? Yes   Other? No   Any new allergies since your discharge? No   Dietary orders reviewed? N/A  Do you have support at home? Yes   Functional Questionnaire: (I = Independent and D = Dependent) ADLs: I  Bathing/Dressing- D  Meal Prep- D  Eating- I  Maintaining continence- I  Transferring/Ambulation- I  Managing Meds- I  Follow up appointments reviewed:   PCP Hospital f/u appt confirmed? No  Scheduled to see [facility] Nurse on 12/13/2020 @ 9:10am.  Are transportation arrangements needed? No  If their condition worsens, is the pt aware to call PCP or go to the Emergency Dept.? Yes Was the patient provided with contact information for the PCP's office or ED? Yes Was to pt encouraged to call back with questions or concerns? Yes

## 2020-12-05 NOTE — Telephone Encounter (Signed)
Patient was advised and expressed understanding.  

## 2020-12-06 ENCOUNTER — Other Ambulatory Visit: Payer: Self-pay

## 2020-12-06 ENCOUNTER — Telehealth (INDEPENDENT_AMBULATORY_CARE_PROVIDER_SITE_OTHER): Payer: Medicaid Other | Admitting: Physician Assistant

## 2020-12-06 ENCOUNTER — Other Ambulatory Visit (HOSPITAL_COMMUNITY)
Admission: RE | Admit: 2020-12-06 | Discharge: 2020-12-06 | Disposition: A | Discharge status: Home / Self Care | Payer: Medicaid Other | Source: Ambulatory Visit | Attending: Physician Assistant | Admitting: Physician Assistant

## 2020-12-06 ENCOUNTER — Ambulatory Visit (HOSPITAL_COMMUNITY)
Admission: RE | Admit: 2020-12-06 | Discharge: 2020-12-06 | Disposition: A | Discharge status: Home / Self Care | Payer: Medicaid Other | Source: Ambulatory Visit | Attending: Physician Assistant | Admitting: Physician Assistant

## 2020-12-06 DIAGNOSIS — R21 Rash and other nonspecific skin eruption: Secondary | ICD-10-CM | POA: Diagnosis not present

## 2020-12-06 DIAGNOSIS — J1282 Pneumonia due to coronavirus disease 2019: Secondary | ICD-10-CM | POA: Insufficient documentation

## 2020-12-06 DIAGNOSIS — N1831 Chronic kidney disease, stage 3a: Secondary | ICD-10-CM

## 2020-12-06 DIAGNOSIS — U071 COVID-19: Secondary | ICD-10-CM

## 2020-12-06 DIAGNOSIS — N183 Chronic kidney disease, stage 3 unspecified: Secondary | ICD-10-CM

## 2020-12-06 DIAGNOSIS — E1122 Type 2 diabetes mellitus with diabetic chronic kidney disease: Secondary | ICD-10-CM | POA: Diagnosis not present

## 2020-12-06 DIAGNOSIS — I871 Compression of vein: Secondary | ICD-10-CM

## 2020-12-06 DIAGNOSIS — E039 Hypothyroidism, unspecified: Secondary | ICD-10-CM | POA: Diagnosis not present

## 2020-12-06 LAB — COMPREHENSIVE METABOLIC PANEL
ALT: 17 U/L (ref 0–44)
AST: 18 U/L (ref 15–41)
Albumin: 3.4 g/dL — ABNORMAL LOW (ref 3.5–5.0)
Alkaline Phosphatase: 86 U/L (ref 38–126)
Anion gap: 10 (ref 5–15)
BUN: 38 mg/dL — ABNORMAL HIGH (ref 6–20)
CO2: 25 mmol/L (ref 22–32)
Calcium: 9.3 mg/dL (ref 8.9–10.3)
Chloride: 105 mmol/L (ref 98–111)
Creatinine, Ser: 1.2 mg/dL — ABNORMAL HIGH (ref 0.44–1.00)
GFR, Estimated: 54 mL/min — ABNORMAL LOW (ref >60)
Glucose, Bld: 90 mg/dL (ref 70–99)
Potassium: 3.8 mmol/L (ref 3.5–5.1)
Sodium: 140 mmol/L (ref 135–145)
Total Bilirubin: 0.6 mg/dL (ref 0.3–1.2)
Total Protein: 7.4 g/dL (ref 6.5–8.1)

## 2020-12-06 LAB — CBC WITH DIFFERENTIAL/PLATELET
Abs Immature Granulocytes: 0.04 10*3/uL (ref 0.00–0.07)
Basophils Absolute: 0 10*3/uL (ref 0.0–0.1)
Basophils Relative: 0 %
Eosinophils Absolute: 0 10*3/uL (ref 0.0–0.5)
Eosinophils Relative: 0 %
HCT: 42.6 % (ref 36.0–46.0)
Hemoglobin: 13.6 g/dL (ref 12.0–15.0)
Immature Granulocytes: 1 %
Lymphocytes Relative: 19 %
Lymphs Abs: 1.5 10*3/uL (ref 0.7–4.0)
MCH: 30.2 pg (ref 26.0–34.0)
MCHC: 31.9 g/dL (ref 30.0–36.0)
MCV: 94.7 fL (ref 80.0–100.0)
Monocytes Absolute: 0.9 10*3/uL (ref 0.1–1.0)
Monocytes Relative: 11 %
Neutro Abs: 5.5 10*3/uL (ref 1.7–7.7)
Neutrophils Relative %: 69 %
Platelets: 130 10*3/uL — ABNORMAL LOW (ref 150–400)
RBC: 4.5 MIL/uL (ref 3.87–5.11)
RDW: 13.5 % (ref 11.5–15.5)
WBC: 7.9 10*3/uL (ref 4.0–10.5)
nRBC: 0 % (ref 0.0–0.2)

## 2020-12-06 LAB — PROTIME-INR
INR: 2.8 — ABNORMAL HIGH (ref 0.8–1.2)
Prothrombin Time: 28.4 seconds — ABNORMAL HIGH (ref 11.4–15.2)

## 2020-12-06 MED ORDER — ALBUTEROL SULFATE HFA 108 (90 BASE) MCG/ACT IN AERS: 2.0000 | INHALATION_SPRAY | Freq: Four times a day (QID) | RESPIRATORY_TRACT | 2 refills | Status: DC | PRN

## 2020-12-06 MED ORDER — PREDNISONE 20 MG PO TABS: 20.0000 mg | ORAL_TABLET | Freq: Every day | ORAL | 0 refills | Status: AC

## 2020-12-07 DIAGNOSIS — E039 Hypothyroidism, unspecified: Secondary | ICD-10-CM | POA: Diagnosis not present

## 2020-12-07 NOTE — Progress Notes (Signed)
MyChart Video Visit    Virtual Visit via Video Note   This visit type was conducted due to national recommendations for restrictions regarding the COVID-19 Pandemic (e.g. social distancing) in an effort to limit this patient's exposure and mitigate transmission in our community. This patient is at least at moderate risk for complications without adequate follow up. This format is felt to be most appropriate for this patient at this time. Physical exam was limited by quality of the video and audio technology used for the visit.   Patient location: Home Provider location: Office   I discussed the limitations of evaluation and management by telemedicine and the availability of in person appointments. The patient expressed understanding and agreed to proceed.  Patient: Jane [name]   DOB: Jul 18, 1966   55 y.o. Female  MRN: [id number] Visit Date: 12/08/2020  Today's healthcare provider: Trinna Post, PA-C   Chief Complaint  Patient presents with  . Follow-up  I,Owain Eckerman M Tiandra Swoveland,acting as a scribe for Trinna Post, PA-C.,have documented all relevant documentation on the behalf of Trinna Post, PA-C,as directed by  Trinna Post, PA-C while in the presence of Trinna Post, PA-C.  Subjective    HPI  Follow-up Patient presents today virtually for follow-up for COVID pneumonia. She had a repeat CXR on Wednesday 12/06/2020 which was stable from 12/03/2020. CBC shows no white count. CMET stable. INR in therapeutic range. She was able to get glucometer from pharmacy but was unable to get pulse oximeter from pharmacy due to them being out. She reports she feels about the same. She feels the albuterol inhaler helps her slightly. She denies fevers, chills, SOB at rest.     Medications: Outpatient Medications Prior to Visit  Medication Sig  . albuterol (VENTOLIN HFA) 108 (90 Base) MCG/ACT inhaler Inhale 2 puffs into the lungs every 6 (six) hours as needed for wheezing or  shortness of breath.  . ALPRAZolam (XANAX) 1 MG tablet Take 1 tablet (1 mg total) by mouth 2 (two) times daily as needed for anxiety.  Marland Kitchen amphetamine-dextroamphetamine (ADDERALL) 30 MG tablet Take 1 tablet by mouth 2 (two) times daily.  Marland Kitchen amphetamine-dextroamphetamine (ADDERALL) 30 MG tablet Take 1 tablet by mouth 2 (two) times daily.  Marland Kitchen amphetamine-dextroamphetamine (ADDERALL) 30 MG tablet Take 1 tablet by mouth 2 (two) times daily.  Marland Kitchen aspirin EC 81 MG tablet Take 81 mg by mouth daily.  Marland Kitchen atorvastatin (LIPITOR) 20 MG tablet Take 1 tablet (20 mg total) by mouth daily.  . cetirizine (ZYRTEC) 10 MG tablet TAKE 1 TABLET BY MOUTH DAILY (Patient taking differently: Take 10 mg by mouth daily.)  . cycloSPORINE (RESTASIS) 0.05 % ophthalmic emulsion Place 1 drop into both eyes 2 (two) times daily.  Marland Kitchen FLUoxetine (PROZAC) 40 MG capsule TAKE 1 TABLET BY MOUTH DAILY  . GARLIC PO Take 2,423 mg by mouth daily.  . hydrochlorothiazide (HYDRODIURIL) 25 MG tablet Take 1 tablet (25 mg total) by mouth daily.  Marland Kitchen levothyroxine (SYNTHROID) 125 MCG tablet Take 125 mcg by mouth daily before breakfast.  . linaclotide (LINZESS) 145 MCG CAPS capsule Take 1 capsule (145 mcg total) by mouth daily.  . methocarbamol (ROBAXIN) 750 MG tablet Take 1 tablet (750 mg total) by mouth 2 (two) times daily as needed for muscle spasms.  . Multiple Vitamin (MULTIVITAMIN) capsule Take 1 capsule by mouth daily.  . Olopatadine HCl 0.2 % SOLN Place 1 drop into both eyes daily.  . Omega-3 Fatty Acids (OMEGA-3 FISH OIL  PO) Take 600 mg by mouth daily.  . pantoprazole (PROTONIX) 40 MG tablet Take 1 tablet (40 mg total) by mouth daily.  . potassium chloride (KLOR-CON) 10 MEQ tablet Take 1 tablet (10 mEq total) by mouth 3 (three) times daily.  . predniSONE (DELTASONE) 20 MG tablet Take 1 tablet (20 mg total) by mouth daily with breakfast for 5 days.  . pregabalin (LYRICA) 100 MG capsule Take 100 mg by mouth 4 (four) times daily.  Marland Kitchen rOPINIRole  (REQUIP) 2 MG tablet Take 1 tablet (2 mg total) by mouth 2 (two) times daily.  . Vitamin D, Ergocalciferol, (DRISDOL) 1.25 MG (50000 UNIT) CAPS capsule Take 1 capsule (50,000 Units total) by mouth every 7 (seven) days.  Marland Kitchen warfarin (COUMADIN) 5 MG tablet TAKE 1 AND 1/2 TABLETS BY MOUTH ON TUEDAYS AND THURSDAYS, THEN TAKE 1 TABLET DAILY ON ALL OTHER DAYS (Patient taking differently: Take 5 mg by mouth See admin instructions. Mon and Fri 2.5 mg, all other days 5 mg)   No facility-administered medications prior to visit.    Review of Systems    Objective    LMP  (LMP Unknown)    Physical Exam Constitutional:      Appearance: Normal appearance.  Pulmonary:     Effort: Pulmonary effort is normal. No respiratory distress.  Skin:    General: Skin is warm and dry.  Neurological:     Mental Status: She is alert.  Psychiatric:        Mood and Affect: Mood normal.        Behavior: Behavior normal.        Assessment & Plan    1. Pneumonia due to COVID-19 virus  Have reviewed CXR, it is stable. Labs are stable. Can follow up in a week. Return precautions advised. Continue prednisone and inhaler, may be helpful if able to get pulse oximeter.  - DG Chest 2 View; Future   Return if symptoms worsen or fail to improve.     I discussed the assessment and treatment plan with the patient. The patient was provided an opportunity to ask questions and all were answered. The patient agreed with the plan and demonstrated an understanding of the instructions.   The patient was advised to call back or seek an in-person evaluation if the symptoms worsen or if the condition fails to improve as anticipated.  ITrinna Post, PA-C, have reviewed all documentation for this visit. The documentation on 12/08/20 for the exam, diagnosis, procedures, and orders are all accurate and complete.  The entirety of the information documented in the History of Present Illness, Review of Systems and Physical  Exam were personally obtained by me. Portions of this information were initially documented by [facility] and reviewed by me for thoroughness and accuracy.    Paulene Floor [facility] [phone] (phone) [phone] (fax)  [place]

## 2020-12-08 ENCOUNTER — Telehealth (INDEPENDENT_AMBULATORY_CARE_PROVIDER_SITE_OTHER): Payer: Medicaid Other | Admitting: Physician Assistant

## 2020-12-08 DIAGNOSIS — U071 COVID-19: Secondary | ICD-10-CM

## 2020-12-08 DIAGNOSIS — J1282 Pneumonia due to coronavirus disease 2019: Secondary | ICD-10-CM | POA: Diagnosis not present

## 2020-12-08 DIAGNOSIS — E039 Hypothyroidism, unspecified: Secondary | ICD-10-CM | POA: Diagnosis not present

## 2020-12-08 NOTE — Patient Instructions (Signed)

## 2020-12-09 DIAGNOSIS — E039 Hypothyroidism, unspecified: Secondary | ICD-10-CM | POA: Diagnosis not present

## 2020-12-10 DIAGNOSIS — E039 Hypothyroidism, unspecified: Secondary | ICD-10-CM | POA: Diagnosis not present

## 2020-12-11 DIAGNOSIS — E039 Hypothyroidism, unspecified: Secondary | ICD-10-CM | POA: Diagnosis not present

## 2020-12-12 DIAGNOSIS — E039 Hypothyroidism, unspecified: Secondary | ICD-10-CM | POA: Diagnosis not present

## 2020-12-13 ENCOUNTER — Ambulatory Visit: Payer: Self-pay

## 2020-12-13 ENCOUNTER — Telehealth: Payer: Self-pay | Admitting: Family Medicine

## 2020-12-13 DIAGNOSIS — E039 Hypothyroidism, unspecified: Secondary | ICD-10-CM | POA: Diagnosis not present

## 2020-12-13 NOTE — Telephone Encounter (Signed)
Copied from [place] #[id number]. >> Dec 13, 2020 11:36 AM Jane [name] wrote: Pt requested a bedside commode and a shower chair  Best contact: [place], calling from [facility] of [place]

## 2020-12-14 ENCOUNTER — Telehealth: Payer: Self-pay | Admitting: Family Medicine

## 2020-12-14 ENCOUNTER — Ambulatory Visit: Payer: Self-pay | Admitting: *Deleted

## 2020-12-14 ENCOUNTER — Ambulatory Visit
Admission: RE | Admit: 2020-12-14 | Discharge: 2020-12-14 | Disposition: A | Discharge status: Home / Self Care | Payer: Medicaid Other | Source: Ambulatory Visit | Attending: Vascular Surgery | Admitting: Vascular Surgery

## 2020-12-14 ENCOUNTER — Encounter (INDEPENDENT_AMBULATORY_CARE_PROVIDER_SITE_OTHER): Payer: Self-pay

## 2020-12-14 ENCOUNTER — Other Ambulatory Visit: Payer: Self-pay | Admitting: Vascular Surgery

## 2020-12-14 ENCOUNTER — Telehealth: Payer: Self-pay

## 2020-12-14 DIAGNOSIS — I87002 Postthrombotic syndrome without complications of left lower extremity: Secondary | ICD-10-CM

## 2020-12-14 DIAGNOSIS — Z86718 Personal history of other venous thrombosis and embolism: Secondary | ICD-10-CM | POA: Diagnosis not present

## 2020-12-14 DIAGNOSIS — R0781 Pleurodynia: Secondary | ICD-10-CM

## 2020-12-14 DIAGNOSIS — R918 Other nonspecific abnormal finding of lung field: Secondary | ICD-10-CM | POA: Diagnosis not present

## 2020-12-14 DIAGNOSIS — Z9049 Acquired absence of other specified parts of digestive tract: Secondary | ICD-10-CM | POA: Diagnosis not present

## 2020-12-14 DIAGNOSIS — I700 Atherosclerosis of aorta: Secondary | ICD-10-CM | POA: Diagnosis not present

## 2020-12-14 DIAGNOSIS — R079 Chest pain, unspecified: Secondary | ICD-10-CM

## 2020-12-14 DIAGNOSIS — E039 Hypothyroidism, unspecified: Secondary | ICD-10-CM | POA: Diagnosis not present

## 2020-12-14 MED ORDER — IOPAMIDOL (ISOVUE-370) INJECTION 76%
125.0000 mL | Freq: Once | INTRAVENOUS | Status: AC | PRN
  Administered 2020-12-14: 125 mL via INTRAVENOUS

## 2020-12-14 NOTE — Telephone Encounter (Signed)
Patient returning call from clinic. Patient reports she is having pain in right chest and lung area when she takes a deep breath. Pain is also noted under her right arm. Patient requesting x-ray from [facility]. My Chart appt scheduled for 12/15/20 at 11:20. Instructed patient if chest pain of difficulty breathing increased to seek help at [facility] or ED. Patient verbalized understanding.

## 2020-12-14 NOTE — Telephone Encounter (Signed)
Pt advised.   Thanks,   -Anila Bojarski  

## 2020-12-14 NOTE — Telephone Encounter (Signed)
Pt asked for an order for a chest xray to be sent to [facility] for her right side of chest due to having pneumonia / Pt stated her right lung is hurting ./ please advise asap

## 2020-12-14 NOTE — Telephone Encounter (Signed)
Needs reevaluation

## 2020-12-14 NOTE — Telephone Encounter (Signed)
CXR on 12/06/20 showed pneumonia

## 2020-12-14 NOTE — Addendum Note (Signed)
Addended by: Mar Daring on: 12/14/2020 11:54 AM   Modules accepted: Orders

## 2020-12-14 NOTE — Telephone Encounter (Signed)
Called patient and no answer. [place], if patient calls back okay for [facility] to advise and schedule appoointment.

## 2020-12-14 NOTE — Telephone Encounter (Signed)
Ordered CXR at [facility]. Patient can go at her convenience today so it will be available tomorrow for Adriana at her appt

## 2020-12-14 NOTE — Telephone Encounter (Signed)
Pt. Calling and asking for a repeat chest xray for her pneumonia. Having pain right chest and "when I take a deep breath." Reports no increase in cough or congestion. Would like chest xray done at [place] with Elmyra Ricks in the practice and will send request for review.

## 2020-12-15 ENCOUNTER — Ambulatory Visit (HOSPITAL_COMMUNITY)
Admission: RE | Admit: 2020-12-15 | Discharge: 2020-12-15 | Disposition: A | Discharge status: Home / Self Care | Payer: Medicaid Other | Source: Ambulatory Visit | Attending: Physician Assistant | Admitting: Physician Assistant

## 2020-12-15 ENCOUNTER — Telehealth (INDEPENDENT_AMBULATORY_CARE_PROVIDER_SITE_OTHER): Payer: Medicaid Other | Admitting: Physician Assistant

## 2020-12-15 ENCOUNTER — Other Ambulatory Visit: Payer: Self-pay

## 2020-12-15 DIAGNOSIS — R079 Chest pain, unspecified: Secondary | ICD-10-CM

## 2020-12-15 DIAGNOSIS — R070 Pain in throat: Secondary | ICD-10-CM | POA: Diagnosis not present

## 2020-12-15 DIAGNOSIS — J029 Acute pharyngitis, unspecified: Secondary | ICD-10-CM | POA: Diagnosis not present

## 2020-12-15 DIAGNOSIS — U071 COVID-19: Secondary | ICD-10-CM | POA: Diagnosis not present

## 2020-12-15 DIAGNOSIS — E039 Hypothyroidism, unspecified: Secondary | ICD-10-CM | POA: Diagnosis not present

## 2020-12-15 DIAGNOSIS — R0781 Pleurodynia: Secondary | ICD-10-CM | POA: Diagnosis not present

## 2020-12-15 MED ORDER — MAGIC MOUTHWASH: ORAL | 0 refills | Status: DC

## 2020-12-15 NOTE — Progress Notes (Signed)
MyChart Video Visit    Virtual Visit via Video Note   This visit type was conducted due to national recommendations for restrictions regarding the COVID-19 Pandemic (e.g. social distancing) in an effort to limit this patient's exposure and mitigate transmission in our community. This patient is at least at moderate risk for complications without adequate follow up. This format is felt to be most appropriate for this patient at this time. Physical exam was limited by quality of the video and audio technology used for the visit.   Patient location: Home Provider location: Office   I discussed the limitations of evaluation and management by telemedicine and the availability of in person appointments. The patient expressed understanding and agreed to proceed.  Patient: Jane [name]   DOB: 1966-04-30   55 y.o. Female  MRN: [id number] Visit Date: 12/15/2020  Today's healthcare provider: Trinna Post, PA-C   Chief Complaint  Patient presents with  . Follow-up  I,Porsha C McClurkin,acting as a scribe for Trinna Post, PA-C.,have documented all relevant documentation on the behalf of Trinna Post, PA-C,as directed by  Trinna Post, PA-C while in the presence of Trinna Post, PA-C.  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 Left AMA from ER and was treated with prednisone and albuterol on 12/06/2020. She states she did have to go to urgent care yesterday, 12/14/20 due to throat being red and painful. Patient states that a strep test was done and it was negative and the doctor send of a throat culture. CXR on 12/15/2020 shows resolving COVID pneumonia. She reports not receiving treatment for sore throat. She also has right sided chest pain when she breathes in. She reports she can put a finger on the area and pushing on it makes it worse. She denies  shortness of breath. She reports fatigue.       Medications: Outpatient Medications Prior to Visit  Medication Sig  . albuterol (VENTOLIN HFA) 108 (90 Base) MCG/ACT inhaler Inhale 2 puffs into the lungs every 6 (six) hours as needed for wheezing or shortness of breath.  . ALPRAZolam (XANAX) 1 MG tablet Take 1 tablet (1 mg total) by mouth 2 (two) times daily as needed for anxiety.  Marland Kitchen amphetamine-dextroamphetamine (ADDERALL) 30 MG tablet Take 1 tablet by mouth 2 (two) times daily.  Marland Kitchen amphetamine-dextroamphetamine (ADDERALL) 30 MG tablet Take 1 tablet by mouth 2 (two) times daily.  Marland Kitchen amphetamine-dextroamphetamine (ADDERALL) 30 MG tablet Take 1 tablet by mouth 2 (two) times daily.  Marland Kitchen aspirin EC 81 MG tablet Take 81 mg by mouth daily.  Marland Kitchen atorvastatin (LIPITOR) 20 MG tablet Take 1 tablet (20 mg total) by mouth daily.  . cetirizine (ZYRTEC) 10 MG tablet TAKE 1 TABLET BY MOUTH DAILY (Patient taking differently: Take 10 mg by mouth daily.)  . cycloSPORINE (RESTASIS) 0.05 % ophthalmic emulsion Place 1 drop into both eyes 2 (two) times daily.  Marland Kitchen FLUoxetine (PROZAC) 40 MG capsule TAKE 1 TABLET BY MOUTH DAILY  . GARLIC PO Take 4,098 mg by mouth daily.  . hydrochlorothiazide (HYDRODIURIL) 25 MG tablet Take 1 tablet (25 mg total) by mouth daily.  Marland Kitchen levothyroxine (SYNTHROID) 125 MCG tablet Take 125 mcg by mouth daily before breakfast.  . linaclotide (LINZESS) 145 MCG CAPS capsule Take 1 capsule (145 mcg total) by mouth daily.  . methocarbamol (ROBAXIN) 750 MG tablet Take  1 tablet (750 mg total) by mouth 2 (two) times daily as needed for muscle spasms.  . Multiple Vitamin (MULTIVITAMIN) capsule Take 1 capsule by mouth daily.  . Olopatadine HCl 0.2 % SOLN Place 1 drop into both eyes daily.  . Omega-3 Fatty Acids (OMEGA-3 FISH OIL PO) Take 600 mg by mouth daily.  . pantoprazole (PROTONIX) 40 MG tablet Take 1 tablet (40 mg total) by mouth daily.  . potassium chloride (KLOR-CON) 10 MEQ tablet Take 1 tablet  (10 mEq total) by mouth 3 (three) times daily.  . pregabalin (LYRICA) 100 MG capsule Take 100 mg by mouth 4 (four) times daily.  Marland Kitchen rOPINIRole (REQUIP) 2 MG tablet Take 1 tablet (2 mg total) by mouth 2 (two) times daily.  . Vitamin D, Ergocalciferol, (DRISDOL) 1.25 MG (50000 UNIT) CAPS capsule Take 1 capsule (50,000 Units total) by mouth every 7 (seven) days.  Marland Kitchen warfarin (COUMADIN) 5 MG tablet TAKE 1 AND 1/2 TABLETS BY MOUTH ON TUEDAYS AND THURSDAYS, THEN TAKE 1 TABLET DAILY ON ALL OTHER DAYS (Patient taking differently: Take 5 mg by mouth See admin instructions. Mon and Fri 2.5 mg, all other days 5 mg)   No facility-administered medications prior to visit.    Review of Systems  Constitutional: Negative for fatigue.  HENT: Positive for sore throat. Negative for congestion.   Respiratory: Negative for cough, chest tightness and wheezing.       Objective    LMP  (LMP Unknown)    Physical Exam Constitutional:      Appearance: Normal appearance. She is not ill-appearing.  Pulmonary:     Effort: Pulmonary effort is normal. No respiratory distress.  Neurological:     Mental Status: She is alert.  Psychiatric:        Mood and Affect: Mood normal.        Behavior: Behavior normal.        Assessment & Plan    1. COVID-19  COVID pneumonia improving on xray. Counseled we can treat throat symptoms with magic mouthwash. Counseled we cannot evaluate for pulmonary embolism in this setting and with her history of May-Thurner syndrome would recommend CT scan in emergent setting to evaluate for this.  2. Sore throat  - magic mouthwash SOLN; 1 Part Lidocaine 1 Part diphenhydramine HCL 1 Part Maalox 1 Part Nystatin  Swish, gargle, and spit.  Take 4 times daily for thrush.  Dispense: 280 mL; Refill: 0  3. Chest pain, unspecified type    No follow-ups on file.     I discussed the assessment and treatment plan with the patient. The patient was provided an opportunity to ask questions  and all were answered. The patient agreed with the plan and demonstrated an understanding of the instructions.   The patient was advised to call back or seek an in-person evaluation if the symptoms worsen or if the condition fails to improve as anticipated.   The entirety of the information documented in the History of Present Illness, Review of Systems and Physical Exam were personally obtained by me. Portions of this information were initially documented by [facility] and reviewed by me for thoroughness and accuracy.   ITrinna Post, PA-C, have reviewed all documentation for this visit. The documentation on 12/15/20 for the exam, diagnosis, procedures, and orders are all accurate and complete.    Paulene Floor [facility] [phone] (phone) [phone] (fax)  [place]

## 2020-12-16 DIAGNOSIS — E039 Hypothyroidism, unspecified: Secondary | ICD-10-CM | POA: Diagnosis not present

## 2020-12-17 DIAGNOSIS — E039 Hypothyroidism, unspecified: Secondary | ICD-10-CM | POA: Diagnosis not present

## 2020-12-18 ENCOUNTER — Ambulatory Visit (INDEPENDENT_AMBULATORY_CARE_PROVIDER_SITE_OTHER): Payer: Medicaid Other | Admitting: Family Medicine

## 2020-12-18 DIAGNOSIS — E039 Hypothyroidism, unspecified: Secondary | ICD-10-CM | POA: Diagnosis not present

## 2020-12-18 DIAGNOSIS — R350 Frequency of micturition: Secondary | ICD-10-CM | POA: Diagnosis not present

## 2020-12-18 DIAGNOSIS — J029 Acute pharyngitis, unspecified: Secondary | ICD-10-CM | POA: Diagnosis not present

## 2020-12-18 DIAGNOSIS — N3001 Acute cystitis with hematuria: Secondary | ICD-10-CM | POA: Diagnosis not present

## 2020-12-19 DIAGNOSIS — E039 Hypothyroidism, unspecified: Secondary | ICD-10-CM | POA: Diagnosis not present

## 2020-12-19 NOTE — Telephone Encounter (Signed)
What supply store does she need them ordered from?

## 2020-12-19 NOTE — Telephone Encounter (Signed)
Patient will pick up at time of her INR

## 2020-12-19 NOTE — Telephone Encounter (Signed)
Written Rx will be left at front desk for pick up. Thanks

## 2020-12-19 NOTE — Telephone Encounter (Signed)
Print copy

## 2020-12-19 NOTE — Telephone Encounter (Signed)
Yeah if we know what DME company she wants to use, we could fax Rx. If she'd rather pick it up, we could do that for paper Rx also.

## 2020-12-20 ENCOUNTER — Ambulatory Visit (INDEPENDENT_AMBULATORY_CARE_PROVIDER_SITE_OTHER): Payer: Medicaid Other

## 2020-12-20 ENCOUNTER — Ambulatory Visit (INDEPENDENT_AMBULATORY_CARE_PROVIDER_SITE_OTHER): Payer: Medicaid Other | Admitting: Orthopedic Surgery

## 2020-12-20 ENCOUNTER — Encounter: Payer: Self-pay | Admitting: Orthopedic Surgery

## 2020-12-20 ENCOUNTER — Other Ambulatory Visit: Payer: Self-pay

## 2020-12-20 VITALS — Ht 70.0 in | Wt 262.0 lb

## 2020-12-20 DIAGNOSIS — Z86718 Personal history of other venous thrombosis and embolism: Secondary | ICD-10-CM

## 2020-12-20 DIAGNOSIS — Z4889 Encounter for other specified surgical aftercare: Secondary | ICD-10-CM

## 2020-12-20 DIAGNOSIS — Z9889 Other specified postprocedural states: Secondary | ICD-10-CM

## 2020-12-20 DIAGNOSIS — E039 Hypothyroidism, unspecified: Secondary | ICD-10-CM | POA: Diagnosis not present

## 2020-12-20 LAB — POCT INR
INR: 7.9 — AB (ref 2.0–3.0)
PT: 95.4

## 2020-12-20 NOTE — Progress Notes (Signed)
Post op   Chief Complaint  Patient presents with  . Routine Post Op    Lt knee DOS 11/14/20     PROCEDURE:  Procedure(s): LEFT KNEE ARTHROSCOPY WITH PARTIAL MEDIAL MENISCECTOMY (Left)   55 year old female progressing well says her knee is much better than before surgery.  She gets little stiffness and swelling but it is intermittent  She is regained her full range of motion and has good strength in her quadriceps.  She has a small flexion contracture which is chronic from arthritis of the knee  She will resume normal activities and increase activities as tolerated and call us if there are any problems

## 2020-12-20 NOTE — Patient Instructions (Signed)
Ice as needed   Resume exercise

## 2020-12-20 NOTE — Patient Instructions (Signed)
Description   Patient is to hold coumadin for 3 days, then resume 5 mg daily.  She is to recheck INR on 12/29/20.  Patient has been advised of precautions needed with INR that high.

## 2020-12-21 DIAGNOSIS — E039 Hypothyroidism, unspecified: Secondary | ICD-10-CM | POA: Diagnosis not present

## 2020-12-21 NOTE — Telephone Encounter (Signed)
Request for Rx to be faxed over to  [facility], [place] Phone:  [phone]  Fax:  [phone]     Please advise

## 2020-12-21 NOTE — Telephone Encounter (Signed)
Patient picked up prescription on 12/20/20 and is going to bring it to the pharmacy

## 2020-12-22 ENCOUNTER — Encounter: Payer: Self-pay | Admitting: Vascular Surgery

## 2020-12-22 ENCOUNTER — Other Ambulatory Visit: Payer: Self-pay

## 2020-12-22 ENCOUNTER — Ambulatory Visit (INDEPENDENT_AMBULATORY_CARE_PROVIDER_SITE_OTHER): Payer: Medicaid Other | Admitting: Vascular Surgery

## 2020-12-22 VITALS — BP 120/80 | HR 70 | Temp 98.0°F | Resp 20 | Ht 70.0 in | Wt 266.0 lb

## 2020-12-22 DIAGNOSIS — E039 Hypothyroidism, unspecified: Secondary | ICD-10-CM | POA: Diagnosis not present

## 2020-12-22 DIAGNOSIS — I871 Compression of vein: Secondary | ICD-10-CM

## 2020-12-22 DIAGNOSIS — I87002 Postthrombotic syndrome without complications of left lower extremity: Secondary | ICD-10-CM

## 2020-12-22 NOTE — Progress Notes (Signed)
Patient ID: Jane [name], female   DOB: 12/11/1965, 55 y.o.   MRN: [id number]  Reason for Consult: Follow-up   Referred by [name], MD  Subjective:     HPI:  Jane [name] is a 55 y.o. female with extensive previous venous history.  Initially had May Thurner syndrome underwent a left to right femoral to femoral venous bypass with arterial venous fistula.  Subsequently she underwent stenting with both an open procedure initially followed by percutaneous procedure.  At last visit she has significant swelling of her left lower extremity this did appear to be multifactorial.  She has undergone meniscal repair now.  States that her swelling is much better.  She actually does not have much leg pain at all.  She is on Coumadin she continues to wear compression stockings as prescribed.  Past Medical History:  Diagnosis Date  . ADD (attention deficit disorder)   . Anxiety   . Back pain   . Bilateral swelling of feet   . Bipolar 1 disorder ([place])   . Bipolar 1 disorder ([place])   . Bipolar disorder ([place])   . Chewing difficulty   . Chronic fatigue syndrome   . Chronic kidney disease    Stage 3 kidney disease;dx by Dr. Sinda Du.   . Constipation   . Depression   . Diabetes mellitus without complication (HCC)    diet controlled  . Dyspnea    with exertion  . GERD (gastroesophageal reflux disease)   . Headache    migraines  . High cholesterol   . History of blood clots   . History of DVT (deep vein thrombosis)    left leg  . Hypertension    states under control with meds., has been on med. x 2 years  . Hypothyroidism   . Joint pain   . Neuropathy   . Obsessive-compulsive disorder   . Peripheral vascular disease ([place])   . Prediabetes   . Respiratory failure requiring intubation ([place])   . Restless leg syndrome   . Shortness of breath   . Sleep apnea   . Thrombocytopenia ([place]) 09/15/2019  . Trigger thumb of left hand 01/2018  . Trigger thumb of right hand     Family History  Problem Relation Age of Onset  . Heart disease Mother   . Hyperlipidemia Mother   . Hypertension Mother   . Bipolar disorder Mother   . Stroke Mother   . Depression Mother   . Sleep apnea Mother   . Obesity Mother   . Diabetes Father   . Heart disease Father   . Hyperlipidemia Father   . Hypertension Father   . Sleep apnea Father   . Obesity Father   . Drug abuse Daughter   . ADD / ADHD Daughter   . Drug abuse Daughter   . Anxiety disorder Daughter   . Bipolar disorder Daughter   . Hypertension Sister   . Hypertension Brother   . Hyperlipidemia Brother   . Heart disease Brother   . Bipolar disorder Maternal Aunt   . Suicidality Maternal Aunt    Past Surgical History:  Procedure Laterality Date  . ABDOMINAL HYSTERECTOMY  06/2016   complete  . APPLICATION OF WOUND VAC Left 04/27/2019   Procedure: APPLICATION OF WOUND VAC LEFT GROIN;  Surgeon: Waynetta Sandy, MD;  Location: [place];  Service: Vascular;  Laterality: Left;  . AV FISTULA PLACEMENT Left 11/24/2018   Procedure: ARTERIOVENOUS (AV) FISTULA CREATION LEFT SFA TO LEFT  FEMORAL VEIN;  Surgeon: Waynetta Sandy, MD;  Location: [place];  Service: Vascular;  Laterality: Left;  . BACK SURGERY    . CHOLECYSTECTOMY    . COLONOSCOPY WITH PROPOFOL N/A 11/22/2019   Procedure: COLONOSCOPY WITH PROPOFOL;  Surgeon: [name], MD;  [facility];  Service: Gastroenterology;  Laterality: N/A;  . FEMORAL ARTERY EXPLORATION  03/30/2019   Procedure: Left Common Femoral Artery and Vein Exploration;  Surgeon: Waynetta Sandy, MD;  Location: [place];  Service: Vascular;;  . FEMORAL-FEMORAL BYPASS GRAFT Left 11/24/2018   Procedure: BYPASS GRAFT FEMORAL-FEMORAL VENOUS LEFT TO RIGHT PALMA PROCEDURE USING CRYOVEIN;  Surgeon: Waynetta Sandy, MD;  Location: [place];  Service: Vascular;  Laterality: Left;  . GROIN DEBRIDEMENT Left 04/27/2019   Procedure: GROIN DEBRIDEMENT;  Surgeon:  Waynetta Sandy, MD;  Location: [place];  Service: Vascular;  Laterality: Left;  . INSERTION OF ILIAC STENT  03/30/2019   Procedure: Stent of left common, external iliac veins and left common femoral vein;  Surgeon: Waynetta Sandy, MD;  Location: [place];  Service: Vascular;;  . KNEE ARTHROSCOPY WITH MENISCAL REPAIR Left 11/14/2020   Procedure: LEFT KNEE ARTHROSCOPY WITH PARTIAL MEDIAL MENISCECTOMY;  Surgeon: Carole Civil, MD;  Location: [facility];  Service: Orthopedics;  Laterality: Left;  . LOWER EXTREMITY VENOGRAPHY N/A 08/17/2018   Procedure: LOWER EXTREMITY VENOGRAPHY - Central Venogram;  Surgeon: Waynetta Sandy, MD;  Location: [place] [facility];  Service: Cardiovascular;  Laterality: N/A;  . LOWER EXTREMITY VENOGRAPHY Bilateral 03/09/2019   Procedure: LOWER EXTREMITY VENOGRAPHY;  Surgeon: Waynetta Sandy, MD;  Location: [place] [facility];  Service: Cardiovascular;  Laterality: Bilateral;  . LOWER EXTREMITY VENOGRAPHY Left 08/16/2019   Procedure: LOWER EXTREMITY VENOGRAPHY;  Surgeon: Waynetta Sandy, MD;  Location: [place] [facility];  Service: Cardiovascular;  Laterality: Left;  . LUMBAR FUSION  11/21/2000   L5-S1  . LUMBAR SPINE SURGERY     x 2 others  . PATCH ANGIOPLASTY Left 03/30/2019   Procedure: Patch Angioplasty of the Left Common Femoral Vein using Venosure Biologic patch;  Surgeon: Waynetta Sandy, MD;  Location: [place];  Service: Vascular;  Laterality: Left;  . PERIPHERAL VASCULAR INTERVENTION Left 08/16/2019   Procedure: PERIPHERAL VASCULAR INTERVENTION;  Surgeon: Waynetta Sandy, MD;  Location: [place] [facility];  Service: Cardiovascular;  Laterality: Left;  common femoral/femoral vein stent  . TRIGGER FINGER RELEASE Right 12/01/2017   Procedure: RELEASE TRIGGER FINGER/A-1 PULLEY RIGHT THUMB;  Surgeon: Leanora Cover, MD;  Location: [place];  Service: Orthopedics;  Laterality: Right;  . TRIGGER  FINGER RELEASE Left 01/26/2018   Procedure: LEFT TRIGGER THUMB RELEASE;  Surgeon: Leanora Cover, MD;  Location: [place];  Service: Orthopedics;  Laterality: Left;  . ULTRASOUND GUIDANCE FOR VASCULAR ACCESS Right 03/30/2019   Procedure: Ultrasound-guided cannulation right internal jugular vein;  Surgeon: Waynetta Sandy, MD;  Location: [place];  Service: Vascular;  Laterality: Right;    Short Social History:  Social History   Tobacco Use  . Smoking status: Never Smoker  . Smokeless tobacco: Never Used  Substance Use Topics  . Alcohol use: No    Allergies  Allergen Reactions  . Aripiprazole Other (See Comments)    BECOMES  VIOLENT   . Seroquel [Quetiapine Fumarate] Other (See Comments)    BECOMES VIOLENT  . Chlorpromazine Other (See Comments)    SEVERE ANXIETY   . Gabapentin Other (See Comments)  NIGHTMARES     Current Outpatient Medications  Medication Sig Dispense Refill  . albuterol (VENTOLIN HFA) 108 (90 Base) MCG/ACT inhaler Inhale 2 puffs into the lungs every 6 (six) hours as needed for wheezing or shortness of breath. 1 each 2  . ALPRAZolam (XANAX) 1 MG tablet Take 1 tablet (1 mg total) by mouth 2 (two) times daily as needed for anxiety. 60 tablet 2  . amphetamine-dextroamphetamine (ADDERALL) 30 MG tablet Take 1 tablet by mouth 2 (two) times daily. 60 tablet 0  . amphetamine-dextroamphetamine (ADDERALL) 30 MG tablet Take 1 tablet by mouth 2 (two) times daily. 90 tablet 0  . amphetamine-dextroamphetamine (ADDERALL) 30 MG tablet Take 1 tablet by mouth 2 (two) times daily. 60 tablet 0  . aspirin EC 81 MG tablet Take 81 mg by mouth daily.    Marland Kitchen atorvastatin (LIPITOR) 20 MG tablet Take 1 tablet (20 mg total) by mouth daily. 90 tablet 3  . cephALEXin (KEFLEX) 500 MG capsule Take by mouth.    . cetirizine (ZYRTEC) 10 MG tablet TAKE 1 TABLET BY MOUTH DAILY (Patient taking differently: Take 10 mg by mouth daily.) 90 tablet 1  . cycloSPORINE (RESTASIS)  0.05 % ophthalmic emulsion Place 1 drop into both eyes 2 (two) times daily.    Marland Kitchen FLUoxetine (PROZAC) 40 MG capsule TAKE 1 TABLET BY MOUTH DAILY 90 capsule 2  . GARLIC PO Take 2,130 mg by mouth daily.    . hydrochlorothiazide (HYDRODIURIL) 25 MG tablet Take 1 tablet (25 mg total) by mouth daily. 30 tablet 0  . HYDROcodone-acetaminophen (NORCO) 10-325 MG tablet TAKE 1 TABLET BY MOUTH EVERY 6 HOURS AS NEEDED FOR SEVERE PAIN MUST LAST 30 DAYS    . levothyroxine (SYNTHROID) 125 MCG tablet Take 125 mcg by mouth daily before breakfast.    . linaclotide (LINZESS) 145 MCG CAPS capsule Take 1 capsule (145 mcg total) by mouth daily. 90 capsule 3  . magic mouthwash SOLN 1 Part Lidocaine 1 Part diphenhydramine HCL 1 Part Maalox 1 Part Nystatin  Swish, gargle, and spit.  Take 4 times daily for thrush. 280 mL 0  . methocarbamol (ROBAXIN) 750 MG tablet Take 1 tablet (750 mg total) by mouth 2 (two) times daily as needed for muscle spasms. 60 tablet 2  . Multiple Vitamin (MULTIVITAMIN) capsule Take 1 capsule by mouth daily.    . Olopatadine HCl 0.2 % SOLN Place 1 drop into both eyes daily.    . Omega-3 Fatty Acids (OMEGA-3 FISH OIL PO) Take 600 mg by mouth daily.    . pantoprazole (PROTONIX) 40 MG tablet Take 1 tablet (40 mg total) by mouth daily. 90 tablet 3  . potassium chloride (KLOR-CON) 10 MEQ tablet Take 1 tablet (10 mEq total) by mouth 3 (three) times daily. 270 tablet 3  . pregabalin (LYRICA) 100 MG capsule Take 100 mg by mouth 4 (four) times daily.    Marland Kitchen rOPINIRole (REQUIP) 2 MG tablet Take 1 tablet (2 mg total) by mouth 2 (two) times daily. 180 tablet 3  . Vitamin D, Ergocalciferol, (DRISDOL) 1.25 MG (50000 UNIT) CAPS capsule Take 1 capsule (50,000 Units total) by mouth every 7 (seven) days. 4 capsule 0  . warfarin (COUMADIN) 5 MG tablet TAKE 1 AND 1/2 TABLETS BY MOUTH ON TUEDAYS AND THURSDAYS, THEN TAKE 1 TABLET DAILY ON ALL OTHER DAYS (Patient taking differently: Take 5 mg by mouth See admin  instructions. Mon and Fri 2.5 mg, all other days 5 mg) 60 tablet 1   No  current facility-administered medications for this visit.    Review of Systems  Constitutional:  Constitutional negative. HENT: HENT negative.  Respiratory: Respiratory negative.  Cardiovascular: Cardiovascular negative.  GI: Gastrointestinal negative.  Musculoskeletal: Musculoskeletal negative.  Skin: Skin negative.  Neurological: Neurological negative. Hematologic: Hematologic/lymphatic negative.  Psychiatric: Psychiatric negative.        Objective:  Objective   Vitals:   12/22/20 0955  BP: 120/80  Pulse: 70  Resp: 20  Temp: 98 F (36.7 C)  SpO2: 95%  Weight: 266 lb (120.7 kg)  Height: 5\' 10"  (1.778 m)   Body mass index is 38.17 kg/m.  Physical Exam Constitutional:      Appearance: She is normal weight.  HENT:     Head: Normocephalic.     Nose:     Comments: Wearing a mask Cardiovascular:     Pulses: Normal pulses.  Pulmonary:     Effort: Pulmonary effort is normal.  Abdominal:     General: Abdomen is flat.     Palpations: Abdomen is soft.  Musculoskeletal:     Cervical back: Normal range of motion.     Right lower leg: No edema.     Left lower leg: No edema.     Comments: Both legs measure 39 cm, 10 cm from the tibial tuberosity  Skin:    Capillary Refill: Capillary refill takes less than 2 seconds.  Neurological:     General: No focal deficit present.  Psychiatric:        Mood and Affect: Mood normal.        Behavior: Behavior normal.        Thought Content: Thought content normal.        Judgment: Judgment normal.     Data: CT IMPRESSION: VASCULAR  1. Widely patent left iliac and femoral venous stents. 2. Minimal aortic atherosclerosis. Aortic Atherosclerosis (ICD10-170.0).  NON-VASCULAR  1. Multifocal patchy ground-glass attenuation airspace opacities in a peribronchovascular distribution throughout the visualized lower lungs. The appearance suggests an acute  or subacute infectious/inflammatory process. COVID pneumonia can have this appearance. 2. Otherwise, ancillary findings as above without significant interval change.     Assessment/Plan:     55 year old female with extensive previous venous history all appears patent by recent CT venogram.  Swelling much improved since repair of her meniscus.  She can follow-up in 1 year with repeat IVC iliac duplex.     Waynetta Sandy MD [facility]

## 2020-12-23 DIAGNOSIS — E039 Hypothyroidism, unspecified: Secondary | ICD-10-CM | POA: Diagnosis not present

## 2020-12-24 ENCOUNTER — Other Ambulatory Visit: Payer: Self-pay | Admitting: Family Medicine

## 2020-12-24 DIAGNOSIS — E039 Hypothyroidism, unspecified: Secondary | ICD-10-CM | POA: Diagnosis not present

## 2020-12-25 DIAGNOSIS — E039 Hypothyroidism, unspecified: Secondary | ICD-10-CM | POA: Diagnosis not present

## 2020-12-26 ENCOUNTER — Other Ambulatory Visit: Payer: Self-pay

## 2020-12-26 ENCOUNTER — Encounter: Payer: Self-pay | Admitting: Student in an Organized Health Care Education/Training Program

## 2020-12-26 ENCOUNTER — Ambulatory Visit
Payer: Medicaid Other | Attending: Student in an Organized Health Care Education/Training Program | Admitting: Student in an Organized Health Care Education/Training Program

## 2020-12-26 VITALS — BP 153/82 | HR 79 | Temp 98.3°F | Resp 18 | Ht 70.0 in | Wt 265.0 lb

## 2020-12-26 DIAGNOSIS — G894 Chronic pain syndrome: Secondary | ICD-10-CM | POA: Diagnosis not present

## 2020-12-26 DIAGNOSIS — M7918 Myalgia, other site: Secondary | ICD-10-CM | POA: Diagnosis not present

## 2020-12-26 DIAGNOSIS — M5136 Other intervertebral disc degeneration, lumbar region: Secondary | ICD-10-CM | POA: Insufficient documentation

## 2020-12-26 DIAGNOSIS — M1712 Unilateral primary osteoarthritis, left knee: Secondary | ICD-10-CM | POA: Diagnosis not present

## 2020-12-26 DIAGNOSIS — M961 Postlaminectomy syndrome, not elsewhere classified: Secondary | ICD-10-CM | POA: Insufficient documentation

## 2020-12-26 DIAGNOSIS — M792 Neuralgia and neuritis, unspecified: Secondary | ICD-10-CM | POA: Diagnosis not present

## 2020-12-26 DIAGNOSIS — M5030 Other cervical disc degeneration, unspecified cervical region: Secondary | ICD-10-CM | POA: Diagnosis not present

## 2020-12-26 DIAGNOSIS — E1142 Type 2 diabetes mellitus with diabetic polyneuropathy: Secondary | ICD-10-CM | POA: Diagnosis not present

## 2020-12-26 DIAGNOSIS — Z7901 Long term (current) use of anticoagulants: Secondary | ICD-10-CM | POA: Insufficient documentation

## 2020-12-26 DIAGNOSIS — G8929 Other chronic pain: Secondary | ICD-10-CM | POA: Diagnosis not present

## 2020-12-26 DIAGNOSIS — E039 Hypothyroidism, unspecified: Secondary | ICD-10-CM | POA: Diagnosis not present

## 2020-12-26 MED ORDER — HYDROCODONE-ACETAMINOPHEN 10-325 MG PO TABS: 1.0000 | ORAL_TABLET | Freq: Four times a day (QID) | ORAL | 0 refills | Status: DC | PRN

## 2020-12-26 MED ORDER — HYDROCODONE-ACETAMINOPHEN 10-325 MG PO TABS: 1.0000 | ORAL_TABLET | Freq: Four times a day (QID) | ORAL | 0 refills | Status: AC | PRN

## 2020-12-26 MED ORDER — METHOCARBAMOL 750 MG PO TABS: 750.0000 mg | ORAL_TABLET | Freq: Two times a day (BID) | ORAL | 2 refills | Status: DC | PRN

## 2020-12-26 NOTE — Progress Notes (Signed)
PROVIDER NOTE: Information contained herein reflects review and annotations entered in association with encounter. Interpretation of such information and data should be left to medically-trained personnel. Information provided to patient can be located elsewhere in the medical record under "Patient Instructions". Document created using STT-dictation technology, any transcriptional errors that may result from process are unintentional.    Patient: Jane [name]  Service Category: E/M  Provider: Gillis Santa, MD  DOB: [birth year]  DOS: 12/26/2020  Specialty: Interventional Pain Management  MRN: [id number]  Setting: Ambulatory outpatient  PCP: [name], MD  Type: Established Patient    Referring Provider: [name], MD  Location: Office  Delivery: Face-to-face     HPI  Ms. Salah Burlison, a 55 y.o. year old female, is here today because of her Arthritis of left knee [M17.12]. Ms. Tufano primary complain today is Back Pain (low) Last encounter: My last encounter with her was on 10/05/2020. Pertinent problems: Ms. 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 Pain Assessment: Severity of Chronic pain is reported as a 8 /10. Location:  Back Lower/radiates down left leg to foot  in the back. Onset: More than a month ago. Quality: Aching,Sharp,Radiating. Timing: Constant. Modifying factor(s): nothing. Vitals:  height is $RemoveB'5\' 10"'GUQYmkTm$  (1.778 m) and weight is 265 lb (120.2 kg). Her temperature is 98.3 F (36.8 C). Her blood pressure is 153/82 (abnormal) and her pulse is 79. Her respiration is 18 and oxygen saturation is 100%.   Reason for encounter: medication management.    Left knee arthrosopic surgery on 11/14/2020.  She is having intermittent stiffness and swelling but states that her range of motion is improving after her left knee meniscus surgery.  She still compensating on her right knee.  Otherwise continues to have persistent low back pain related to lumbar spine surgeries, postlaminectomy pain syndrome, chronic lumbar radicular pain.  Of note, patient's INR was supratherapeutic at seven and she is trying to get that better managed.  Today she presents for a refill of her hydrocodone.  No change in dose.  UDS up-to-date and appropriate.  Patient compliant with therapy.   Pharmacotherapy Assessment   Analgesic: Hydrocodone 10 mg 4 times daily as needed, quantity 120/month; MME equals 40   Monitoring: [place] PMP: PDMP reviewed during this encounter.       Pharmacotherapy: No side-effects or adverse reactions reported. Compliance: No problems identified. Effectiveness: Clinically acceptable.  Dewayne Shorter, RN  12/26/2020  9:37 AM  Signed Nursing Pain Medication Assessment:  Safety precautions to be maintained throughout the outpatient stay will include: orient to surroundings, keep bed in low position, maintain call bell within reach at all times, provide assistance with transfer out of bed and ambulation.  Medication Inspection Compliance: Pill count conducted under aseptic conditions, in front of the patient. Neither the pills nor the bottle was removed from the patient's sight at any time. Once count was completed pills were  immediately returned to the  patient in their original bottle.  Medication: Hydrocodone/APAP Pill/Patch Count: 102 of 120 pills remain Pill/Patch Appearance: Markings consistent with prescribed medication Bottle Appearance: Standard pharmacy container. Clearly labeled. Filled Date: 02 / 18 / 2022 Last Medication intake:  Today    UDS:  Summary  Date Value Ref Range Status  04/24/2020 Note  Final    Comment:    ==================================================================== Compliance Drug Analysis, Ur ==================================================================== Test                             Result       Flag       Units  Drug Present and Declared for Prescription Verification   Amphetamine                    13465        EXPECTED   ng/mg creat    Amphetamine is available as a schedule II prescription drug.    Hydrocodone                    335          EXPECTED   ng/mg creat   Dihydrocodeine                 104          EXPECTED   ng/mg creat   Norhydrocodone                 284          EXPECTED   ng/mg creat    Sources of hydrocodone include scheduled prescription medications.    Dihydrocodeine and norhydrocodone are expected metabolites of    hydrocodone. Dihydrocodeine is also available as a scheduled    prescription medication.    Pregabalin                     PRESENT      EXPECTED   Cyclobenzaprine                PRESENT      EXPECTED   Desmethylcyclobenzaprine       PRESENT      EXPECTED    Desmethylcyclobenzaprine is an expected metabolite of    cyclobenzaprine.    Fluoxetine                     PRESENT      EXPECTED   Norfluoxetine                  PRESENT      EXPECTED    Norfluoxetine is an expected metabolite of fluoxetine.    Acetaminophen                  PRESENT      EXPECTED  Drug Absent but Declared for Prescription Verification   Alprazolam                     Not Detected UNEXPECTED ng/mg creat   Salicylate                     Not  Detected UNEXPECTED    Aspirin, as indicated in the declared medication list, is not always    detected even when used as directed.  ==================================================================== Test  Result    Flag   Units      Ref Range   Creatinine              55               mg/dL      >=11 ==================================================================== Declared Medications:  The flagging and interpretation on this report are based on the  following declared medications.  Unexpected results may arise from  inaccuracies in the declared medications.   **Note: The testing scope of this panel includes these medications:   Alprazolam (Xanax)  Amphetamine (Adderall)  Cyclobenzaprine (Flexeril)  Fluoxetine (Prozac)  Hydrocodone (Norco)  Pregabalin (Lyrica)   **Note: The testing scope of this panel does not include small to  moderate amounts of these reported medications:   Acetaminophen (Norco)  Aspirin   **Note: The testing scope of this panel does not include the  following reported medications:   Atorvastatin (Lipitor)  Cetirizine (Zyrtec)  Eye Drop  Hydrochlorothiazide (Hydrodiuril)  Levothyroxine (Synthroid)  Linaclotide (Linzess)  Lisinopril (Zestril)  Magnesium  Multivitamin  Olopatadine  Pantoprazole (Protonix)  Potassium (Klor-Con)  Ropinirole (Requip)  Vitamin C  Vitamin D2 (Drisdol)  Warfarin (Coumadin) ==================================================================== For clinical consultation, please call [phone]. ====================================================================      ROS  Constitutional: Denies any fever or chills Gastrointestinal: No reported hemesis, hematochezia, vomiting, or acute GI distress Musculoskeletal: Left knee pain Neurological: No reported episodes of acute onset apraxia, aphasia, dysarthria, agnosia, amnesia, paralysis, loss of coordination, or loss of  consciousness  Medication Review  ALPRAZolam, FLUoxetine, Garlic, HYDROcodone-acetaminophen, Olopatadine HCl, Omega-3 Fatty Acids, albuterol, amphetamine-dextroamphetamine, aspirin EC, atorvastatin, cetirizine, cycloSPORINE, hydrochlorothiazide, levothyroxine, linaclotide, magic mouthwash, methocarbamol, multivitamin, pantoprazole, potassium chloride, pregabalin, rOPINIRole, and warfarin  History Review  Allergy: Ms. Radin is allergic to aripiprazole, seroquel [quetiapine fumarate], chlorpromazine, and gabapentin. Drug: Ms. Justiniano  reports no history of drug use. Alcohol:  reports no history of alcohol use. Tobacco:  reports that she has never smoked. She has never used smokeless tobacco. Social: Ms. Carico  reports that she has never smoked. She has never used smokeless tobacco. She reports that she does not drink alcohol and does not use drugs. Medical:  has a past medical history of ADD (attention deficit disorder), Anxiety, Back pain, Bilateral swelling of feet, Bipolar 1 disorder (HCC), Bipolar 1 disorder (HCC), Bipolar disorder (HCC), Chewing difficulty, Chronic fatigue syndrome, Chronic kidney disease, Constipation, Depression, Diabetes mellitus without complication (HCC), Dyspnea, GERD (gastroesophageal reflux disease), Headache, High cholesterol, History of blood clots, History of DVT (deep vein thrombosis), Hypertension, Hypothyroidism, Joint pain, Neuropathy, Obsessive-compulsive disorder, Peripheral vascular disease (HCC), Prediabetes, Respiratory failure requiring intubation (HCC), Restless leg syndrome, Shortness of breath, Sleep apnea, Thrombocytopenia (HCC) (09/15/2019), Trigger thumb of left hand (01/2018), and Trigger thumb of right hand. Surgical: Ms. Lindenbaum  has a past surgical history that includes Cholecystectomy; Trigger finger release (Right, 12/01/2017); Abdominal hysterectomy (06/2016); Lumbar fusion (11/21/2000); Lumbar spine surgery; Trigger finger release (Left,  01/26/2018); LOWER EXTREMITY VENOGRAPHY (N/A, 08/17/2018); Femoral-femoral Bypass Graft (Left, 11/24/2018); AV fistula placement (Left, 11/24/2018); LOWER EXTREMITY VENOGRAPHY (Bilateral, 03/09/2019); Patch angioplasty (Left, 03/30/2019); Ultrasound guidance for vascular access (Right, 03/30/2019); Femoral artery debridement (03/30/2019); Insertion of iliac stent (03/30/2019); Groin debridement (Left, 04/27/2019); Application if wound vac (Left, 04/27/2019); LOWER EXTREMITY VENOGRAPHY (Left, 08/16/2019); PERIPHERAL VASCULAR INTERVENTION (Left, 08/16/2019); Back surgery; Colonoscopy with propofol (N/A, 11/22/2019); and Knee arthroscopy with meniscal repair (Left, 11/14/2020). Family: family history includes ADD / ADHD in her daughter; Anxiety disorder in her daughter;  Bipolar disorder in her daughter, maternal aunt, and mother; Depression in her mother; Diabetes in her father; Drug abuse in her daughter and daughter; Heart disease in her brother, father, and mother; Hyperlipidemia in her brother, father, and mother; Hypertension in her brother, father, mother, and sister; Obesity in her father and mother; Sleep apnea in her father and mother; Stroke in her mother; Suicidality in her maternal aunt.  Laboratory Chemistry Profile   Renal Lab Results  Component Value Date   BUN 38 (H) 12/06/2020   CREATININE 1.20 (H) 12/06/2020   BCR 18 03/29/2020   GFRAA 53 (L) 07/18/2020   GFRNONAA 54 (L) 12/06/2020     Hepatic Lab Results  Component Value Date   AST 18 12/06/2020   ALT 17 12/06/2020   ALBUMIN 3.4 (L) 12/06/2020   ALKPHOS 86 12/06/2020     Electrolytes Lab Results  Component Value Date   NA 140 12/06/2020   K 3.8 12/06/2020   CL 105 12/06/2020   CALCIUM 9.3 12/06/2020   MG 2.2 04/04/2019   PHOS 4.0 04/04/2019     Bone Lab Results  Component Value Date   VD25OH 45.30 10/17/2020     Inflammation (CRP: Acute Phase) (ESR: Chronic Phase) Lab Results  Component Value Date   CRP 11 (H) 02/16/2020    ESRSEDRATE 33 02/16/2020   LATICACIDVEN 1.2 01/05/2020       Note: Above Lab results reviewed.  Recent Imaging Review  DG Chest 2 View CLINICAL DATA:  Right pleuritic chest pain  EXAM: CHEST - 2 VIEW  COMPARISON:  12/06/2020  FINDINGS: The lungs are symmetrically well expanded. Pulmonary insufflation has improved since prior examination. Previously noted pulmonary infiltrates have largely resolved. No pneumothorax or pleural effusion. Cardiac size within normal limits. Pulmonary vascularity is normal. Osseous structures are age-appropriate. No acute bone abnormality.  IMPRESSION: Resolved pulmonary infiltrates. Improved pulmonary insufflation. No radiographic evidence of acute cardiopulmonary disease.  Electronically Signed   By: Fidela Salisbury MD   On: 12/15/2020 10:11 Note: Reviewed        Physical Exam  General appearance: Well nourished, well developed, and well hydrated. In no apparent acute distress Mental status: Alert, oriented x 3 (person, place, & time)       Respiratory: No evidence of acute respiratory distress Eyes: PERLA Vitals: BP (!) 153/82   Pulse 79   Temp 98.3 F (36.8 C)   Resp 18   Ht $R'5\' 10"'DP$  (1.778 m)   Wt 265 lb (120.2 kg)   LMP  (LMP Unknown)   SpO2 100%   BMI 38.02 kg/m  BMI: Estimated body mass index is 38.02 kg/m as calculated from the following:   Height as of this encounter: $RemoveBeforeD'5\' 10"'mrhqrXNMHQcnEz$  (1.778 m).   Weight as of this encounter: 265 lb (120.2 kg). Ideal: Ideal body weight: 68.5 kg (151 lb 0.2 oz) Adjusted ideal body weight: 89.2 kg (196 lb 9.7 oz)  Lumbar Spine Area Exam  Skin & Axial Inspection:Well healed scar from previous spine surgery detected Alignment:Asymmetric Functional SAY:[id number] ROM Stability:No instability detected Muscle Tone/Strength:Functionally intact. No obvious neuro-muscular anomalies detected. Sensory (Neurological):Dermatomal pain pattern  Provocative Tests: Hyperextension/rotation  test:(+)bilaterally for facet joint pain. Lumbar quadrant test (Kemp's test):(+)bilaterally for facet joint pain.   Gait & Posture Assessment  Ambulation:Limited Gait:Antalgic gait (limping) Posture:Difficulty standing up straight, due to pain Lower Extremity Exam    Side:Right lower extremity  Side:Left lower extremity  Stability:No instability observed  Stability:No instability observed  Skin & Extremity Inspection:Skin color,  temperature, and hair growth are WNL. No peripheral edema or cyanosis. No masses, redness, swelling, asymmetry, or associated skin lesions. No contractures.  Skin & Extremity Inspection:Skin color, temperature, and hair growth are WNL. No peripheral edema or cyanosis. No masses, redness, swelling, asymmetry, or associated skin lesions. No contractures.  Functional POE:UMPNTIRWE ROMfor hip and knee joints   Functional RXV:QMGQQPYPP ROMfor hip and knee joints, pain with weight bearing and lateral rotation   Muscle Tone/Strength:Functionally intact. No obvious neuro-muscular anomalies detected.  Muscle Tone/Strength:Functionally intact. No obvious neuro-muscular anomalies detected.  Sensory (Neurological):Unimpaired  Sensory (Neurological):Arthropathic arthralgia for left knee severe pain with weightbearing  DTR: Patellar:deferred today Achilles:deferred today Plantar:deferred today  DTR: Patellar:deferred today Achilles:deferred today Plantar:deferred today  Palpation:No palpable anomalies  Palpation:No palpable anomalies   Assessment   Status Diagnosis  Controlled Controlled Controlled 1. Arthritis of left knee   2. DDD (degenerative disc disease), cervical   3. Failed back surgical syndrome   4. Diabetic peripheral neuropathy ([place])   5. Neurogenic pain   6. Chronic musculoskeletal pain   7. DDD (degenerative disc disease), lumbar   8. Chronic anticoagulation (Coumadin)    9. Chronic pain syndrome      Updated Problems: Problem  Arthritis of Left Knee    Plan of Care  Ms. Treva Huyett has a current medication list which includes the following long-term medication(s): albuterol, amphetamine-dextroamphetamine, amphetamine-dextroamphetamine, amphetamine-dextroamphetamine, atorvastatin, cetirizine, fluoxetine, hydrochlorothiazide, levothyroxine, linaclotide, pantoprazole, potassium chloride, ropinirole, and warfarin.  Pharmacotherapy (Medications Ordered): Meds ordered this encounter  Medications  . HYDROcodone-acetaminophen (NORCO) 10-325 MG tablet    Sig: Take 1 tablet by mouth every 6 (six) hours as needed for severe pain. Must last 30 days.    Dispense:  120 tablet    Refill:  0    Chronic Pain. (STOP Act - Not applicable). Fill one day early if closed on scheduled refill date.  Marland Kitchen HYDROcodone-acetaminophen (NORCO) 10-325 MG tablet    Sig: Take 1 tablet by mouth every 6 (six) hours as needed for severe pain. Must last 30 days.    Dispense:  120 tablet    Refill:  0    Chronic Pain. (STOP Act - Not applicable). Fill one day early if closed on scheduled refill date.  Marland Kitchen HYDROcodone-acetaminophen (NORCO) 10-325 MG tablet    Sig: Take 1 tablet by mouth every 6 (six) hours as needed for severe pain. Must last 30 days.    Dispense:  120 tablet    Refill:  0    Chronic Pain. (STOP Act - Not applicable). Fill one day early if closed on scheduled refill date.  . methocarbamol (ROBAXIN) 750 MG tablet    Sig: Take 1 tablet (750 mg total) by mouth 2 (two) times daily as needed for muscle spasms.    Dispense:  60 tablet    Refill:  2    Do not place this medication, or any other prescription from our practice, on "Automatic Refill". Patient may have prescription filled one day early if pharmacy is closed on scheduled refill date.   Follow-up plan:   Return in about 4 months (around 04/19/2021) for Medication Management, in person.   Recent Visits Date Type  Provider Dept  10/05/20 Office Visit Gillis Santa, MD [facility]  Showing recent visits within past 90 days and meeting all other requirements Today's Visits Date Type Provider Dept  12/26/20 Office Visit Gillis Santa, MD [facility]  Showing today's visits and meeting all other requirements Future Appointments No  visits were found meeting these conditions. Showing future appointments within next 90 days and meeting all other requirements  I discussed the assessment and treatment plan with the patient. The patient was provided an opportunity to ask questions and all were answered. The patient agreed with the plan and demonstrated an understanding of the instructions.  Patient advised to call back or seek an in-person evaluation if the symptoms or condition worsens.  Duration of encounter: 66minutes.  Note by: Gillis Santa, MD Date: 12/26/2020; Time: 11:41 AM

## 2020-12-26 NOTE — Progress Notes (Signed)
Nursing Pain Medication Assessment:  Safety precautions to be maintained throughout the outpatient stay will include: orient to surroundings, keep bed in low position, maintain call bell within reach at all times, provide assistance with transfer out of bed and ambulation.  Medication Inspection Compliance: Pill count conducted under aseptic conditions, in front of the patient. Neither the pills nor the bottle was removed from the patient's sight at any time. Once count was completed pills were immediately returned to the patient in their original bottle.  Medication: Hydrocodone/APAP Pill/Patch Count: 102 of 120 pills remain Pill/Patch Appearance: Markings consistent with prescribed medication Bottle Appearance: Standard pharmacy container. Clearly labeled. Filled Date: 02 / 18 / 2022 Last Medication intake:  Today

## 2020-12-27 DIAGNOSIS — E039 Hypothyroidism, unspecified: Secondary | ICD-10-CM | POA: Diagnosis not present

## 2020-12-28 ENCOUNTER — Encounter (INDEPENDENT_AMBULATORY_CARE_PROVIDER_SITE_OTHER): Payer: Self-pay | Admitting: Family Medicine

## 2020-12-28 ENCOUNTER — Other Ambulatory Visit: Payer: Self-pay

## 2020-12-28 ENCOUNTER — Telehealth (INDEPENDENT_AMBULATORY_CARE_PROVIDER_SITE_OTHER): Payer: Medicaid Other | Admitting: Family Medicine

## 2020-12-28 DIAGNOSIS — E039 Hypothyroidism, unspecified: Secondary | ICD-10-CM | POA: Diagnosis not present

## 2020-12-28 DIAGNOSIS — G478 Other sleep disorders: Secondary | ICD-10-CM

## 2020-12-28 DIAGNOSIS — E559 Vitamin D deficiency, unspecified: Secondary | ICD-10-CM

## 2020-12-28 DIAGNOSIS — Z6838 Body mass index (BMI) 38.0-38.9, adult: Secondary | ICD-10-CM

## 2020-12-28 MED ORDER — VITAMIN D (ERGOCALCIFEROL) 1.25 MG (50000 UNIT) PO CAPS
50000.0000 [IU] | ORAL_CAPSULE | ORAL | 0 refills | Status: DC
Start: 2020-12-28 — End: 2021-01-18

## 2020-12-28 MED ORDER — TOPIRAMATE 25 MG PO TABS: 25.0000 mg | ORAL_TABLET | Freq: Every day | ORAL | 0 refills | Status: DC

## 2020-12-29 ENCOUNTER — Other Ambulatory Visit: Payer: Self-pay

## 2020-12-29 ENCOUNTER — Ambulatory Visit (INDEPENDENT_AMBULATORY_CARE_PROVIDER_SITE_OTHER): Payer: Medicaid Other

## 2020-12-29 DIAGNOSIS — E039 Hypothyroidism, unspecified: Secondary | ICD-10-CM | POA: Diagnosis not present

## 2020-12-29 DIAGNOSIS — Z86718 Personal history of other venous thrombosis and embolism: Secondary | ICD-10-CM

## 2020-12-29 LAB — POCT INR
INR: 4.7 — AB (ref 2.0–3.0)
PT: 56.6

## 2020-12-30 DIAGNOSIS — E039 Hypothyroidism, unspecified: Secondary | ICD-10-CM | POA: Diagnosis not present

## 2020-12-31 DIAGNOSIS — E039 Hypothyroidism, unspecified: Secondary | ICD-10-CM | POA: Diagnosis not present

## 2021-01-01 ENCOUNTER — Inpatient Hospital Stay (HOSPITAL_COMMUNITY): Payer: Medicaid Other | Attending: Medical

## 2021-01-01 ENCOUNTER — Other Ambulatory Visit: Payer: Self-pay

## 2021-01-01 ENCOUNTER — Encounter (INDEPENDENT_AMBULATORY_CARE_PROVIDER_SITE_OTHER): Payer: Self-pay | Admitting: Family Medicine

## 2021-01-01 DIAGNOSIS — Z862 Personal history of diseases of the blood and blood-forming organs and certain disorders involving the immune mechanism: Secondary | ICD-10-CM

## 2021-01-01 DIAGNOSIS — E559 Vitamin D deficiency, unspecified: Secondary | ICD-10-CM | POA: Insufficient documentation

## 2021-01-01 DIAGNOSIS — E1122 Type 2 diabetes mellitus with diabetic chronic kidney disease: Secondary | ICD-10-CM

## 2021-01-01 DIAGNOSIS — D649 Anemia, unspecified: Secondary | ICD-10-CM

## 2021-01-01 DIAGNOSIS — E039 Hypothyroidism, unspecified: Secondary | ICD-10-CM | POA: Diagnosis not present

## 2021-01-01 DIAGNOSIS — N183 Chronic kidney disease, stage 3 unspecified: Secondary | ICD-10-CM

## 2021-01-01 DIAGNOSIS — D509 Iron deficiency anemia, unspecified: Secondary | ICD-10-CM

## 2021-01-01 DIAGNOSIS — G478 Other sleep disorders: Secondary | ICD-10-CM | POA: Insufficient documentation

## 2021-01-01 LAB — CBC WITH DIFFERENTIAL/PLATELET
Abs Immature Granulocytes: 0.05 10*3/uL (ref 0.00–0.07)
Basophils Absolute: 0 10*3/uL (ref 0.0–0.1)
Basophils Relative: 1 %
Eosinophils Absolute: 0.2 10*3/uL (ref 0.0–0.5)
Eosinophils Relative: 4 %
HCT: 39.5 % (ref 36.0–46.0)
Hemoglobin: 12.7 g/dL (ref 12.0–15.0)
Immature Granulocytes: 1 %
Lymphocytes Relative: 27 %
Lymphs Abs: 1.4 10*3/uL (ref 0.7–4.0)
MCH: 30.5 pg (ref 26.0–34.0)
MCHC: 32.2 g/dL (ref 30.0–36.0)
MCV: 95 fL (ref 80.0–100.0)
Monocytes Absolute: 0.7 10*3/uL (ref 0.1–1.0)
Monocytes Relative: 13 %
Neutro Abs: 2.9 10*3/uL (ref 1.7–7.7)
Neutrophils Relative %: 54 %
Platelets: 176 10*3/uL (ref 150–400)
RBC: 4.16 MIL/uL (ref 3.87–5.11)
RDW: 14.4 % (ref 11.5–15.5)
WBC: 5.3 10*3/uL (ref 4.0–10.5)
nRBC: 0 % (ref 0.0–0.2)

## 2021-01-01 LAB — IRON AND TIBC
Iron: 74 ug/dL (ref 28–170)
Saturation Ratios: 25 % (ref 10.4–31.8)
TIBC: 301 ug/dL (ref 250–450)
UIBC: 227 ug/dL

## 2021-01-01 LAB — COMPREHENSIVE METABOLIC PANEL
ALT: 19 U/L (ref 0–44)
AST: 20 U/L (ref 15–41)
Albumin: 3.7 g/dL (ref 3.5–5.0)
Alkaline Phosphatase: 84 U/L (ref 38–126)
Anion gap: 11 (ref 5–15)
BUN: 32 mg/dL — ABNORMAL HIGH (ref 6–20)
CO2: 29 mmol/L (ref 22–32)
Calcium: 9.2 mg/dL (ref 8.9–10.3)
Chloride: 100 mmol/L (ref 98–111)
Creatinine, Ser: 1.36 mg/dL — ABNORMAL HIGH (ref 0.44–1.00)
GFR, Estimated: 46 mL/min — ABNORMAL LOW (ref >60)
Glucose, Bld: 135 mg/dL — ABNORMAL HIGH (ref 70–99)
Potassium: 3.9 mmol/L (ref 3.5–5.1)
Sodium: 140 mmol/L (ref 135–145)
Total Bilirubin: 0.4 mg/dL (ref 0.3–1.2)
Total Protein: 6.9 g/dL (ref 6.5–8.1)

## 2021-01-01 LAB — VITAMIN B12: Vitamin B-12: 369 pg/mL (ref 180–914)

## 2021-01-01 LAB — FOLATE: Folate: 12.3 ng/mL (ref >5.9)

## 2021-01-01 LAB — FERRITIN: Ferritin: 203 ng/mL (ref 11–307)

## 2021-01-01 LAB — VITAMIN D 25 HYDROXY (VIT D DEFICIENCY, FRACTURES): Vit D, 25-Hydroxy: 50.6 ng/mL (ref 30–100)

## 2021-01-01 LAB — LACTATE DEHYDROGENASE: LDH: 163 U/L (ref 98–192)

## 2021-01-01 NOTE — Progress Notes (Signed)
TeleHealth Visit:  Due to the COVID-19 pandemic, this visit was completed with telemedicine (audio/video) technology to reduce patient and provider exposure as well as to preserve personal protective equipment.   Jane [name] has verbally consented to this TeleHealth visit. The patient is located at home, the provider is located at the [facility] office. The participants in this visit include the listed provider and patient. The visit was conducted today via video.  Chief Complaint: OBESITY Jane [name] is here to discuss her progress with her obesity treatment plan along with follow-up of her obesity related diagnoses. Jane [name] is on the [employer] and practicing portion control and making smarter food choices, such as increasing vegetables and decreasing simple carbohydrates and states she is following her eating plan approximately 50% of the time. Jane [name] states she is doing 0 minutes 0 times per week.  Today's visit was #: 14 Starting weight: 283 lbs Starting date: 03/29/2020  Interim History: Jane [name] is recovering from left knee arthroscopy and doing well. She is on PC/[place] and feels she is getting in adequate protein. She notes that eating during the night is an issue for her.   Subjective:   1. Nocturnal sleep-related eating disorder Jane [name] notes eating during the night is an issue for her. She notes this is a long standing issue and feels she takes in a good portion of her calories at night (>25%) . She sometimes wakes up during the night and eats.  2. Vitamin D deficiency Jane [name]'s Vitamin D level was low at 45.30 on 10/17/2020. She is currently taking prescription vitamin D 50,000 IU each week.   Ref. Range 10/17/2020 10:20  Vitamin D, 25-Hydroxy Latest Ref Range: 30 - 100 ng/mL 45.30   Assessment/Plan:   1. Nocturnal sleep-related eating disorder New Rx- Topamax 25 mg daily with supper, as per below.  Patient denies history of glaucoma or kidney stones. She was informed of  side effects.  - topiramate (TOPAMAX) 25 MG tablet; Take 1 tablet (25 mg total) by mouth daily with supper.  Dispense: 30 tablet; Refill: 0  2. Vitamin D deficiency . She agrees to continue to take prescription Vitamin D @50 ,000 IU every week and will follow-up for routine testing of Vitamin D, at least 2-3 times per year to avoid over-replacement.  - Vitamin D, Ergocalciferol, (DRISDOL) 1.25 MG (50000 UNIT) CAPS capsule; Take 1 capsule (50,000 Units total) by mouth every 7 (seven) days.  Dispense: 4 capsule; Refill: 0  3. Class 2 severe obesity due to excess calories with serious comorbidity and body mass index (BMI) of 38.0 to 38.9 in adult [facility]) Jane [name] is currently in the action stage of change. As such, her goal is to continue with weight loss efforts. She has agreed to keeping a food journal and adhering to recommended goals of 1800-1900 calories and 100-110 g protein.   Information sent about journaling through [place].  Exercise goals: No exercise has been prescribed at this time.  Behavioral modification strategies: increasing lean protein intake, decreasing simple carbohydrates and keeping a strict food journal.  Jane [name] has agreed to follow-up with our clinic in 3 weeks.   Objective:   VITALS: Per patient if applicable, see vitals. GENERAL: Alert and in no acute distress. CARDIOPULMONARY: No increased WOB. Speaking in clear sentences.  PSYCH: Pleasant and cooperative. Speech normal rate and rhythm. Affect is appropriate. Insight and judgement are appropriate. Attention is focused, linear, and appropriate.  NEURO: Oriented as arrived to appointment on time with no prompting.  Lab Results  Component Value Date   CREATININE 1.20 (H) 12/06/2020   BUN 38 (H) 12/06/2020   NA 140 12/06/2020   K 3.8 12/06/2020   CL 105 12/06/2020   CO2 25 12/06/2020   Lab Results  Component Value Date   ALT 17 12/06/2020   AST 18 12/06/2020   ALKPHOS 86 12/06/2020   BILITOT 0.6 12/06/2020    Lab Results  Component Value Date   HGBA1C 5.8 (A) 10/24/2020   HGBA1C 6.2 (H) 03/29/2020   HGBA1C 6.3 (H) 10/25/2019   HGBA1C 6.3 (H) 03/26/2019   HGBA1C 7.5 11/18/2018   Lab Results  Component Value Date   INSULIN 21.5 03/29/2020   Lab Results  Component Value Date   TSH 0.107 (L) 05/18/2020   Lab Results  Component Value Date   CHOL 203 (H) 03/29/2020   HDL 37 (L) 03/29/2020   LDLCALC 114 (H) 03/29/2020   TRIG 296 (H) 03/29/2020   CHOLHDL 5.0 (H) 10/25/2019   Lab Results  Component Value Date   WBC 7.9 12/06/2020   HGB 13.6 12/06/2020   HCT 42.6 12/06/2020   MCV 94.7 12/06/2020   PLT 130 (L) 12/06/2020   Lab Results  Component Value Date   IRON 69 10/17/2020   TIBC 312 10/17/2020   FERRITIN 145 10/17/2020    Attestation Statements:   Reviewed by clinician on day of visit: allergies, medications, problem list, medical history, surgical history, family history, social history, and previous encounter notes.  Coral Ceo, am acting as Location manager for [employer], [place].  I have reviewed the above documentation for accuracy and completeness, and I agree with the above. - Georgianne Fick, FNP

## 2021-01-02 ENCOUNTER — Inpatient Hospital Stay (HOSPITAL_COMMUNITY): Payer: Medicaid Other | Attending: Hematology | Admitting: Oncology

## 2021-01-02 ENCOUNTER — Other Ambulatory Visit: Payer: Self-pay

## 2021-01-02 ENCOUNTER — Encounter (HOSPITAL_COMMUNITY): Payer: Self-pay | Admitting: Oncology

## 2021-01-02 VITALS — BP 132/68 | HR 70 | Temp 97.1°F | Resp 18 | Wt 268.0 lb

## 2021-01-02 DIAGNOSIS — M25562 Pain in left knee: Secondary | ICD-10-CM | POA: Diagnosis not present

## 2021-01-02 DIAGNOSIS — Z7901 Long term (current) use of anticoagulants: Secondary | ICD-10-CM | POA: Diagnosis not present

## 2021-01-02 DIAGNOSIS — N183 Chronic kidney disease, stage 3 unspecified: Secondary | ICD-10-CM

## 2021-01-02 DIAGNOSIS — Z79899 Other long term (current) drug therapy: Secondary | ICD-10-CM | POA: Insufficient documentation

## 2021-01-02 DIAGNOSIS — R319 Hematuria, unspecified: Secondary | ICD-10-CM | POA: Insufficient documentation

## 2021-01-02 DIAGNOSIS — D509 Iron deficiency anemia, unspecified: Secondary | ICD-10-CM | POA: Insufficient documentation

## 2021-01-02 DIAGNOSIS — Z86718 Personal history of other venous thrombosis and embolism: Secondary | ICD-10-CM | POA: Diagnosis not present

## 2021-01-02 DIAGNOSIS — Z862 Personal history of diseases of the blood and blood-forming organs and certain disorders involving the immune mechanism: Secondary | ICD-10-CM

## 2021-01-02 DIAGNOSIS — R5381 Other malaise: Secondary | ICD-10-CM | POA: Diagnosis not present

## 2021-01-02 DIAGNOSIS — E039 Hypothyroidism, unspecified: Secondary | ICD-10-CM | POA: Diagnosis not present

## 2021-01-02 DIAGNOSIS — R5383 Other fatigue: Secondary | ICD-10-CM | POA: Insufficient documentation

## 2021-01-02 DIAGNOSIS — R059 Cough, unspecified: Secondary | ICD-10-CM | POA: Insufficient documentation

## 2021-01-02 DIAGNOSIS — E1122 Type 2 diabetes mellitus with diabetic chronic kidney disease: Secondary | ICD-10-CM

## 2021-01-02 DIAGNOSIS — R531 Weakness: Secondary | ICD-10-CM | POA: Insufficient documentation

## 2021-01-02 NOTE — Progress Notes (Signed)
Jane [name], [place] [id number]   CLINIC:  Medical Oncology/Hematology  [place], Jane Bucy, MD [address] [place] [address]   DIAGNOSIS: Iron deficiency anemia  CURRENT THERAPY: Intermittent iron infusions  INTERVAL HISTORY: Jane [name] is a 56 y.o. female with a diagnosis of an iron deficiency anemia.  She was last seen in clinic on 10/25/2020.  She last received iron infusion 04/26/2020 and 05/03/2020.  She recently had a balloon angioplasty with Dr. Gwenlyn Saran on 11/10/2020 for recurrent left lower extremity DVT and swelling.  She was on Coumadin but is being bridged to Lovenox for left meniscal surgery.  Had CT venogram of her abdomen/pelvis on 12/14/2020 which showed patent left iliac and femoral venous stents.  Left arthroscopy with partial medial meniscectomy on 11/14/2020.  Appears to tolerate surgery well.  Developed some shortness of breath postop and was seen virtually on 12/06/2020.  Tested positive for COVID-19.  Chest x-ray confirmed Covid pneumonia.  Today, she continues to recover slowly from COVID-19.  She still complains of chronic fatigue and generalized weakness.  She is currently back on Coumadin.  Her INR has been extremely elevated recently and she has noted some bleeding in her urine.  She has follow-up on Friday with repeat labs with her PCP.  Last INR was 4.7.  She continues to heal from her left knee surgery.  She does not require crutches or cane.  Reports she is sleeping more throughout the day and is up often throughout the night due to nocturia.   Patient Active Problem List   Diagnosis Date Noted  . Nocturnal sleep-related eating disorder 01/01/2021  . Vitamin D deficiency 01/01/2021  . Arthritis of left knee 12/26/2020  . S/P left knee arthroscopy 11/14/20  11/21/2020  . Old complex tear of medial meniscus of left knee   . Chronic pain syndrome 04/24/2020  . Pharmacologic therapy 04/24/2020  . Disorder  of skeletal system 04/24/2020  . Problems influencing health status 04/24/2020  . History of thrombocytopenia 04/24/2020  . Abnormal MRI, cervical spine (01/08/2017) 04/24/2020  . Abnormal MRI, lumbar spine (05/11/2014) 04/24/2020  . DDD (degenerative disc disease), cervical 04/24/2020  . DDD (degenerative disc disease), lumbar 04/24/2020  . 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 Diabetic peripheral neuropathy ([place]) 04/24/2020  . Neurogenic pain 04/24/2020  . Chronic musculoskeletal pain 04/24/2020  . Other proteinuria 03/30/2020  . Myalgia 03/15/2020  . Bilateral hand swelling 02/16/2020  . Thrush 01/20/2020  . Class 2 severe obesity due to excess calories with serious comorbidity and body mass index (BMI) of 38.0 to 38.9 in adult [facility]) 10/25/2019  . History of back surgery 10/25/2019  . Peripheral neuropathy 10/25/2019  . Restless leg syndrome 10/25/2019  . Chronic low back pain (Bilateral) w/ sciatica (Bilateral) 10/25/2019  . Essential hypertension 10/25/2019  . Type 2 diabetes mellitus with stage 3 chronic kidney disease, without long-term current use of insulin ([place]) 10/25/2019  . Chronic anticoagulation (Coumadin) 10/25/2019  . Hyperlipidemia associated with type 2 diabetes mellitus ([place]) 10/25/2019  . CKD stage 3 due to type 2 diabetes mellitus ([place]) 10/25/2019  . GERD (gastroesophageal reflux disease) 10/25/2019  . Allergic rhinitis 10/25/2019  . Chronic constipation 10/25/2019  . Hypothyroidism   . Normocytic anemia 09/15/2019  . Wound of left groin 04/27/2019  . 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 Chronic venous insufficiency 11/21/2017  . Displacement of lumbar intervertebral disc without myelopathy 07/14/2017  .  Radicular low back pain 05/25/2014  . Depression, major, recurrent ([place]) 06/17/2013  . OCD (obsessive compulsive disorder) 11/20/2012  . 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 Bipolar 1 disorder ([place])  09/25/2012  . History of DVT (deep vein thrombosis) 08/18/2012    is allergic to aripiprazole, seroquel [quetiapine fumarate], chlorpromazine, and gabapentin.  MEDICAL HISTORY: Past Medical History:  Diagnosis Date  . ADD (attention deficit disorder)   . Anxiety   . Back pain   . Bilateral swelling of feet   . Bipolar 1 disorder ([place])   . Bipolar 1 disorder ([place])   . Bipolar disorder ([place])   . Chewing difficulty   . Chronic fatigue syndrome   . Chronic kidney disease    Stage 3 kidney disease;dx by Dr. Sinda Du.   . Constipation   . Depression   . Diabetes mellitus without complication (HCC)    diet controlled  . Dyspnea    with exertion  . GERD (gastroesophageal reflux disease)   . Headache    migraines  . High cholesterol   . History of blood clots   . History of DVT (deep vein thrombosis)    left leg  . Hypertension    states under control with meds., has been on med. x 2 years  . Hypothyroidism   . Joint pain   . Neuropathy   . Obsessive-compulsive disorder   . Peripheral vascular disease ([place])   . Prediabetes   . Respiratory failure requiring intubation ([place])   . Restless leg syndrome   . Shortness of breath   . Sleep apnea   . Thrombocytopenia ([place]) 09/15/2019  . Trigger thumb of left hand 01/2018  . Trigger thumb of right hand     SURGICAL HISTORY: Past Surgical History:  Procedure Laterality Date  . ABDOMINAL HYSTERECTOMY  06/2016   complete  . APPLICATION OF WOUND VAC Left 04/27/2019   Procedure: APPLICATION OF WOUND VAC LEFT GROIN;  Surgeon: Waynetta Sandy, MD;  Location: [place];  Service: Vascular;  Laterality: Left;  . AV FISTULA PLACEMENT Left 11/24/2018   Procedure: ARTERIOVENOUS (AV) FISTULA CREATION LEFT SFA TO LEFT FEMORAL VEIN;  Surgeon: Waynetta Sandy, MD;  Location: [place];  Service: Vascular;  Laterality: Left;  . BACK SURGERY    . CHOLECYSTECTOMY    . COLONOSCOPY WITH PROPOFOL N/A 11/22/2019   Procedure:  COLONOSCOPY WITH PROPOFOL;  Surgeon: [name], MD;  Location: [facility];  Service: Gastroenterology;  Laterality: N/A;  . FEMORAL ARTERY EXPLORATION  03/30/2019   Procedure: Left Common Femoral Artery and Vein Exploration;  Surgeon: Waynetta Sandy, MD;  Location: [place];  Service: Vascular;;  . FEMORAL-FEMORAL BYPASS GRAFT Left 11/24/2018   Procedure: BYPASS GRAFT FEMORAL-FEMORAL VENOUS LEFT TO RIGHT PALMA PROCEDURE USING CRYOVEIN;  Surgeon: Waynetta Sandy, MD;  Location: [place];  Service: Vascular;  Laterality: Left;  . GROIN DEBRIDEMENT Left 04/27/2019   Procedure: GROIN DEBRIDEMENT;  Surgeon: Waynetta Sandy, MD;  Location: [place];  Service: Vascular;  Laterality: Left;  . INSERTION OF ILIAC STENT  03/30/2019   Procedure: Stent of left common, external iliac veins and left common femoral vein;  Surgeon: Waynetta Sandy, MD;  Location: [place];  Service: Vascular;;  . KNEE ARTHROSCOPY WITH MENISCAL REPAIR Left 11/14/2020   Procedure: LEFT KNEE ARTHROSCOPY WITH PARTIAL MEDIAL MENISCECTOMY;  Surgeon: Carole Civil, MD;  Location: [facility];  Service: Orthopedics;  Laterality: Left;  . LOWER EXTREMITY VENOGRAPHY N/A 08/17/2018  Procedure: LOWER EXTREMITY VENOGRAPHY - Central Venogram;  Surgeon: Waynetta Sandy, MD;  Location: [place] CV LAB;  Service: Cardiovascular;  Laterality: N/A;  . LOWER EXTREMITY VENOGRAPHY Bilateral 03/09/2019   Procedure: LOWER EXTREMITY VENOGRAPHY;  Surgeon: Waynetta Sandy, MD;  Location: [place] [facility];  Service: Cardiovascular;  Laterality: Bilateral;  . LOWER EXTREMITY VENOGRAPHY Left 08/16/2019   Procedure: LOWER EXTREMITY VENOGRAPHY;  Surgeon: Waynetta Sandy, MD;  Location: [place] [facility];  Service: Cardiovascular;  Laterality: Left;  . LUMBAR FUSION  11/21/2000   L5-S1  . LUMBAR SPINE SURGERY     x 2 others  . PATCH ANGIOPLASTY Left 03/30/2019   Procedure: Patch Angioplasty of  the Left Common Femoral Vein using Venosure Biologic patch;  Surgeon: Waynetta Sandy, MD;  Location: [place];  Service: Vascular;  Laterality: Left;  . PERIPHERAL VASCULAR INTERVENTION Left 08/16/2019   Procedure: PERIPHERAL VASCULAR INTERVENTION;  Surgeon: Waynetta Sandy, MD;  Location: [place] [facility];  Service: Cardiovascular;  Laterality: Left;  common femoral/femoral vein stent  . TRIGGER FINGER RELEASE Right 12/01/2017   Procedure: RELEASE TRIGGER FINGER/A-1 PULLEY RIGHT THUMB;  Surgeon: Leanora Cover, MD;  Location: [place];  Service: Orthopedics;  Laterality: Right;  . TRIGGER FINGER RELEASE Left 01/26/2018   Procedure: LEFT TRIGGER THUMB RELEASE;  Surgeon: Leanora Cover, MD;  Location: [place];  Service: Orthopedics;  Laterality: Left;  . ULTRASOUND GUIDANCE FOR VASCULAR ACCESS Right 03/30/2019   Procedure: Ultrasound-guided cannulation right internal jugular vein;  Surgeon: Waynetta Sandy, MD;  Location: [place];  Service: Vascular;  Laterality: Right;    SOCIAL HISTORY: Social History   Socioeconomic History  . Marital status: Divorced    Spouse name: Not on file  . Number of children: 3  . Years of education: Not on file  . Highest education level: Not on file  Occupational History  . Not on file  Tobacco Use  . Smoking status: Never Smoker  . Smokeless tobacco: Never Used  Vaping Use  . Vaping Use: Never used  Substance and Sexual Activity  . Alcohol use: No  . Drug use: No  . Sexual activity: Not Currently    Partners: Male    Birth control/protection: Surgical  Other Topics Concern  . Not on file  Social History Narrative  . Not on file   Social Determinants of Health   Financial Resource Strain: High Risk  . Difficulty of Paying Living Expenses: Hard  Food Insecurity: Food Insecurity Present  . Worried About Charity fundraiser in the Last Year: Sometimes true  . Ran Out of Food in the Last  Year: Never true  Transportation Needs: No Transportation Needs  . Lack of Transportation (Medical): No  . Lack of Transportation (Non-Medical): No  Physical Activity: Inactive  . Days of Exercise per Week: 0 days  . Minutes of Exercise per Session: 0 min  Stress: Stress Concern Present  . Feeling of Stress : Very much  Social Connections: Socially Isolated  . Frequency of Communication with Friends and Family: More than three times a week  . Frequency of Social Gatherings with Friends and Family: Three times a week  . Attends Religious Services: Never  . Active Member of Clubs or Organizations: No  . Attends Archivist Meetings: Never  . Marital Status: Divorced  Human resources officer Violence: Not At Risk  . Fear of Current or Ex-Partner: No  . Emotionally Abused: No  .  Physically Abused: No  . Sexually Abused: No    FAMILY HISTORY: Family History  Problem Relation Age of Onset  . Heart disease Mother   . Hyperlipidemia Mother   . Hypertension Mother   . Bipolar disorder Mother   . Stroke Mother   . Depression Mother   . Sleep apnea Mother   . Obesity Mother   . Diabetes Father   . Heart disease Father   . Hyperlipidemia Father   . Hypertension Father   . Sleep apnea Father   . Obesity Father   . Drug abuse Daughter   . ADD / ADHD Daughter   . Drug abuse Daughter   . Anxiety disorder Daughter   . Bipolar disorder Daughter   . Hypertension Sister   . Hypertension Brother   . Hyperlipidemia Brother   . Heart disease Brother   . Bipolar disorder Maternal Aunt   . Suicidality Maternal Aunt    REVIEW OF SYSTEMS:  Review of Systems  Constitutional: Positive for malaise/fatigue.  Respiratory: Positive for cough.   Genitourinary: Positive for hematuria.  Musculoskeletal: Positive for joint pain (right knee).  Neurological: Positive for dizziness and weakness.    PHYSICAL EXAM:  Physical Exam Constitutional:      General: She is not in acute distress.     Appearance: She is normal weight. She is not ill-appearing, toxic-appearing or diaphoretic.  HENT:     Head: Normocephalic and atraumatic.  Eyes:     General: No scleral icterus.       Right eye: No discharge.        Left eye: No discharge.     Conjunctiva/sclera: Conjunctivae normal.  Cardiovascular:     Rate and Rhythm: Normal rate and regular rhythm.     Heart sounds: No murmur heard. No friction rub. No gallop.   Pulmonary:     Effort: Pulmonary effort is normal. No respiratory distress.     Breath sounds: Normal breath sounds. No wheezing, rhonchi or rales.  Abdominal:     General: Bowel sounds are normal.  Musculoskeletal:     Right lower leg: No edema.     Left lower leg: No edema.  Skin:    General: Skin is warm and dry.  Neurological:     Coordination: Coordination normal.     Gait: Gait normal.      LABORATORY DATA:  CBC    Component Value Date/Time   WBC 5.3 01/01/2021 1118   RBC 4.16 01/01/2021 1118   HGB 12.7 01/01/2021 1118   HGB 13.2 03/29/2020 1250   HCT 39.5 01/01/2021 1118   HCT 39.6 03/29/2020 1250   PLT 176 01/01/2021 1118   PLT 142 (L) 03/29/2020 1250   MCV 95.0 01/01/2021 1118   MCV 94 03/29/2020 1250   MCH 30.5 01/01/2021 1118   MCHC 32.2 01/01/2021 1118   RDW 14.4 01/01/2021 1118   RDW 12.6 03/29/2020 1250   LYMPHSABS 1.4 01/01/2021 1118   LYMPHSABS 1.4 03/29/2020 1250   MONOABS 0.7 01/01/2021 1118   EOSABS 0.2 01/01/2021 1118   EOSABS 0.2 03/29/2020 1250   BASOSABS 0.0 01/01/2021 1118   BASOSABS 0.0 03/29/2020 1250    CMP     Component Value Date/Time   NA 140 01/01/2021 1118   NA 142 03/29/2020 1250   K 3.9 01/01/2021 1118   CL 100 01/01/2021 1118   CO2 29 01/01/2021 1118   GLUCOSE 135 (H) 01/01/2021 1118   BUN 32 (H) 01/01/2021 1118  BUN 24 03/29/2020 1250   CREATININE 1.36 (H) 01/01/2021 1118   CALCIUM 9.2 01/01/2021 1118   PROT 6.9 01/01/2021 1118   PROT 6.9 03/29/2020 1250   ALBUMIN 3.7 01/01/2021 1118   ALBUMIN  4.1 03/29/2020 1250   AST 20 01/01/2021 1118   ALT 19 01/01/2021 1118   ALKPHOS 84 01/01/2021 1118   BILITOT 0.4 01/01/2021 1118   BILITOT 0.3 03/29/2020 1250   GFRNONAA 46 (L) 01/01/2021 1118   GFRAA 53 (L) 07/18/2020 1434    All questions were answered to patient's stated satisfaction. Encouraged patient to call with any new concerns or questions before his next visit to the cancer center and we can certain see him sooner, if needed.      ASSESSMENT and THERAPY PLAN:  1) Iron Deficiency Anemia: -Labs from 01/01/21 show hemoglobin of 12.7, ferritin 203 with an iron saturation of 25%. -She last received IV Feraheme on 04/26/2020 and 05/03/2020. -No additional IV iron needed today. -RTC in 3 months with repeat labs (CBC, CMP, LDH, ferritin, iron) and MD assessment.  2) left knee pain: -She is status post left partial medial meniscectomy on 11/14/2020 -She tolerated procedure well.  3) history of a DVT with recurrent DVT on 11/10/2020.: -She is currently on Coumadin after recently bridging back off of Lovenox. -Most recent INR is 4.7. -She has had some hematuria. -She has repeat labs on Friday. -Had CT venogram abdominal/pelvis on 12/14/2020 which showed patent left iliac and femoral venous stents.  4.  Weakness/fatigue: -Patient is not feeling well. -Likely secondary to recent Covid infection, surgery and manipulation of medications. -All of her labs from today pretty good from a hematology standpoint.  Disposition: -RTC in 3 months with repeat labs (CBC, ferritin, iron,, B12, folate and vitamin D) and MD assessment.  Greater than 50% was spent in counseling and coordination of care with this patient including but not limited to discussion of the relevant topics above (See A&P) including, but not limited to diagnosis and management of acute and chronic medical conditions.    Jacquelin Hawking, NP 01/02/2021

## 2021-01-03 ENCOUNTER — Ambulatory Visit: Payer: Self-pay

## 2021-01-03 DIAGNOSIS — E039 Hypothyroidism, unspecified: Secondary | ICD-10-CM | POA: Diagnosis not present

## 2021-01-04 DIAGNOSIS — E039 Hypothyroidism, unspecified: Secondary | ICD-10-CM | POA: Diagnosis not present

## 2021-01-05 ENCOUNTER — Encounter: Payer: Self-pay | Admitting: Family Medicine

## 2021-01-05 ENCOUNTER — Ambulatory Visit (INDEPENDENT_AMBULATORY_CARE_PROVIDER_SITE_OTHER): Payer: Medicaid Other | Admitting: Family Medicine

## 2021-01-05 ENCOUNTER — Other Ambulatory Visit: Payer: Self-pay

## 2021-01-05 DIAGNOSIS — Z86718 Personal history of other venous thrombosis and embolism: Secondary | ICD-10-CM

## 2021-01-05 DIAGNOSIS — E039 Hypothyroidism, unspecified: Secondary | ICD-10-CM | POA: Diagnosis not present

## 2021-01-05 LAB — POCT INR
INR: 2.3 (ref 2.0–3.0)
PT: 27.7

## 2021-01-05 NOTE — Progress Notes (Signed)
Patient here for INR check and Coumadin Management.  INR at goal at 2.3. Continue Coumadin 5mg  daily, except 2.5mg  M and W. Recheck in 2 weeks.

## 2021-01-05 NOTE — Patient Instructions (Signed)
Continue 2.5 mg on Monday's& Wednesday"s, then 5 mg daily. She is to recheck INR on 01/17/21.

## 2021-01-06 DIAGNOSIS — E039 Hypothyroidism, unspecified: Secondary | ICD-10-CM | POA: Diagnosis not present

## 2021-01-07 DIAGNOSIS — E039 Hypothyroidism, unspecified: Secondary | ICD-10-CM | POA: Diagnosis not present

## 2021-01-08 DIAGNOSIS — E039 Hypothyroidism, unspecified: Secondary | ICD-10-CM | POA: Diagnosis not present

## 2021-01-09 DIAGNOSIS — E039 Hypothyroidism, unspecified: Secondary | ICD-10-CM | POA: Diagnosis not present

## 2021-01-10 DIAGNOSIS — E039 Hypothyroidism, unspecified: Secondary | ICD-10-CM | POA: Diagnosis not present

## 2021-01-11 DIAGNOSIS — E039 Hypothyroidism, unspecified: Secondary | ICD-10-CM | POA: Diagnosis not present

## 2021-01-12 DIAGNOSIS — E039 Hypothyroidism, unspecified: Secondary | ICD-10-CM | POA: Diagnosis not present

## 2021-01-13 DIAGNOSIS — E039 Hypothyroidism, unspecified: Secondary | ICD-10-CM | POA: Diagnosis not present

## 2021-01-14 DIAGNOSIS — E039 Hypothyroidism, unspecified: Secondary | ICD-10-CM | POA: Diagnosis not present

## 2021-01-15 DIAGNOSIS — E039 Hypothyroidism, unspecified: Secondary | ICD-10-CM | POA: Diagnosis not present

## 2021-01-16 DIAGNOSIS — E039 Hypothyroidism, unspecified: Secondary | ICD-10-CM | POA: Diagnosis not present

## 2021-01-17 ENCOUNTER — Other Ambulatory Visit: Payer: Self-pay

## 2021-01-17 ENCOUNTER — Ambulatory Visit (INDEPENDENT_AMBULATORY_CARE_PROVIDER_SITE_OTHER): Payer: Medicaid Other

## 2021-01-17 DIAGNOSIS — E039 Hypothyroidism, unspecified: Secondary | ICD-10-CM | POA: Diagnosis not present

## 2021-01-17 DIAGNOSIS — Z86718 Personal history of other venous thrombosis and embolism: Secondary | ICD-10-CM

## 2021-01-17 LAB — POCT INR
INR: 1.9 — AB (ref 2.0–3.0)
PT: 22.4

## 2021-01-17 NOTE — Patient Instructions (Signed)
Continue 5mg  daily except 2.5mg  M W.  Recheck in two weeks.

## 2021-01-18 ENCOUNTER — Ambulatory Visit (INDEPENDENT_AMBULATORY_CARE_PROVIDER_SITE_OTHER): Payer: Medicaid Other | Admitting: Family Medicine

## 2021-01-18 ENCOUNTER — Other Ambulatory Visit: Payer: Self-pay

## 2021-01-18 VITALS — BP 138/79 | HR 73 | Temp 97.9°F | Ht 70.0 in | Wt 263.0 lb

## 2021-01-18 DIAGNOSIS — N1831 Chronic kidney disease, stage 3a: Secondary | ICD-10-CM

## 2021-01-18 DIAGNOSIS — Z6837 Body mass index (BMI) 37.0-37.9, adult: Secondary | ICD-10-CM

## 2021-01-18 DIAGNOSIS — E039 Hypothyroidism, unspecified: Secondary | ICD-10-CM | POA: Diagnosis not present

## 2021-01-18 DIAGNOSIS — E559 Vitamin D deficiency, unspecified: Secondary | ICD-10-CM | POA: Diagnosis not present

## 2021-01-18 DIAGNOSIS — E1122 Type 2 diabetes mellitus with diabetic chronic kidney disease: Secondary | ICD-10-CM

## 2021-01-18 MED ORDER — TOPIRAMATE 25 MG PO TABS: 25.0000 mg | ORAL_TABLET | Freq: Every day | ORAL | 0 refills | Status: DC

## 2021-01-18 MED ORDER — VITAMIN D (ERGOCALCIFEROL) 1.25 MG (50000 UNIT) PO CAPS: 50000.0000 [IU] | ORAL_CAPSULE | ORAL | 0 refills | Status: DC

## 2021-01-19 DIAGNOSIS — E039 Hypothyroidism, unspecified: Secondary | ICD-10-CM | POA: Diagnosis not present

## 2021-01-20 DIAGNOSIS — E039 Hypothyroidism, unspecified: Secondary | ICD-10-CM | POA: Diagnosis not present

## 2021-01-21 DIAGNOSIS — E039 Hypothyroidism, unspecified: Secondary | ICD-10-CM | POA: Diagnosis not present

## 2021-01-22 DIAGNOSIS — E039 Hypothyroidism, unspecified: Secondary | ICD-10-CM | POA: Diagnosis not present

## 2021-01-22 NOTE — Progress Notes (Signed)
Chief Complaint:   OBESITY [name] is here to discuss her progress with her obesity treatment plan along with follow-up of her obesity related diagnoses. Jane [name] is on keeping a food journal and adhering to recommended goals of 1800-1900 calories and 100-110 protein and states she is following her eating plan approximately 0% of the time. Jane [name] states she is walking 5,000-8,000 steps 7 times per week.  Today's visit was #: 15 Starting weight: 283 lbs Starting date: 03/29/2020 Today's weight: 264 lbs Today's date: 01/18/2021 Total lbs lost to date: 19 lbs Total lbs lost since last in-office visit: 0  Interim History: Jane [name] has had a hard few weeks with surgery and her dog of 13 years died. She has had pneumonia and COVID and left the hospital AMA. She has not been journaling. She wants to get back on track with original meal plan.   Subjective:   1. Type 2 diabetes mellitus with stage 3a chronic kidney disease, without long-term current use of insulin (HCC) Jane [name]'s last [name] was 5.8 and insulin level 21.5. She is not on medication.  2. Vitamin D deficiency Jane [name]'s last Vit D level was 50.6. She denies nausea, vomiting, and muscle weakness but notes fatigue. Pt is on prescription Vit D.  Assessment/Plan:   1. Type 2 diabetes mellitus with stage 3a chronic kidney disease, without long-term current use of insulin (HCC) Good blood sugar control is important to decrease the likelihood of diabetic complications such as nephropathy, neuropathy, limb loss, blindness, coronary artery disease, and death. Intensive lifestyle modification including diet, exercise and weight loss are the first line of treatment for diabetes. Repeat labs in May 2022.  2. Vitamin D deficiency Low Vitamin D level contributes to fatigue and are associated with obesity, breast, and colon cancer. She agrees to continue to take prescription Vitamin D @50 ,000 IU every week and will follow-up for routine testing of Vitamin D,  at least 2-3 times per year to avoid over-replacement.  - Vitamin D, Ergocalciferol, (DRISDOL) 1.25 MG (50000 UNIT) CAPS capsule; Take 1 capsule (50,000 Units total) by mouth every 7 (seven) days.  Dispense: 4 capsule; Refill: 0  3. Class 2 severe obesity with serious comorbidity and body mass index (BMI) of 37.0 to 37.9 in adult, unspecified obesity type (HCC) Jane [name] is currently in the action stage of change. As such, her goal is to continue with weight loss efforts. She has agreed to the Category 4 Plan.   We discussed various medication options to help Jane [name] with her weight loss efforts and we both agreed to continue Topamax, as per below. - topiramate (TOPAMAX) 25 MG tablet; Take 1 tablet (25 mg total) by mouth daily with supper.  Dispense: 30 tablet; Refill: 0  Exercise goals: As is- Goal is 5,000 steps a day  Behavioral modification strategies: increasing lean protein intake, decreasing simple carbohydrates, no skipping meals and meal planning and cooking strategies.  Jane [name] has agreed to follow-up with our clinic in 2-3 weeks. She was informed of the importance of frequent follow-up visits to maximize her success with intensive lifestyle modifications for her multiple health conditions.   Objective:   Blood pressure 138/79, pulse 73, temperature 97.9 F (36.6 C), temperature source Oral, height 5\' 10"  (1.778 m), weight 263 lb (119.3 kg), SpO2 97 %. Body mass index is 37.74 kg/m.  General: Cooperative, alert, well developed, in no acute distress. HEENT: Conjunctivae and lids unremarkable. Cardiovascular: Regular rhythm.  Lungs: Normal work of breathing. Neurologic: No focal deficits.  Lab Results  Component Value Date   CREATININE 1.36 (H) 01/01/2021   BUN 32 (H) 01/01/2021   NA 140 01/01/2021   K 3.9 01/01/2021   CL 100 01/01/2021   CO2 29 01/01/2021   Lab Results  Component Value Date   ALT 19 01/01/2021   AST 20 01/01/2021   ALKPHOS 84 01/01/2021   BILITOT 0.4  01/01/2021   Lab Results  Component Value Date   HGBA1C 5.8 (A) 10/24/2020   HGBA1C 6.2 (H) 03/29/2020   HGBA1C 6.3 (H) 10/25/2019   HGBA1C 6.3 (H) 03/26/2019   HGBA1C 7.5 11/18/2018   Lab Results  Component Value Date   INSULIN 21.5 03/29/2020   Lab Results  Component Value Date   TSH 0.107 (L) 05/18/2020   Lab Results  Component Value Date   CHOL 203 (H) 03/29/2020   HDL 37 (L) 03/29/2020   LDLCALC 114 (H) 03/29/2020   TRIG 296 (H) 03/29/2020   CHOLHDL 5.0 (H) 10/25/2019   Lab Results  Component Value Date   WBC 5.3 01/01/2021   HGB 12.7 01/01/2021   HCT 39.5 01/01/2021   MCV 95.0 01/01/2021   PLT 176 01/01/2021   Lab Results  Component Value Date   IRON 74 01/01/2021   TIBC 301 01/01/2021   FERRITIN 203 01/01/2021    Attestation Statements:   Reviewed by clinician on day of visit: allergies, medications, problem list, medical history, surgical history, family history, social history, and previous encounter notes.  Coral Ceo, am acting as transcriptionist for Coralie Common, MD.  I have reviewed the above documentation for accuracy and completeness, and I agree with the above. - Jinny Blossom, MD

## 2021-01-23 DIAGNOSIS — E039 Hypothyroidism, unspecified: Secondary | ICD-10-CM | POA: Diagnosis not present

## 2021-01-24 DIAGNOSIS — E039 Hypothyroidism, unspecified: Secondary | ICD-10-CM | POA: Diagnosis not present

## 2021-01-25 ENCOUNTER — Other Ambulatory Visit (HOSPITAL_COMMUNITY): Payer: Medicaid Other

## 2021-01-25 DIAGNOSIS — E039 Hypothyroidism, unspecified: Secondary | ICD-10-CM | POA: Diagnosis not present

## 2021-01-26 DIAGNOSIS — E039 Hypothyroidism, unspecified: Secondary | ICD-10-CM | POA: Diagnosis not present

## 2021-01-27 DIAGNOSIS — E039 Hypothyroidism, unspecified: Secondary | ICD-10-CM | POA: Diagnosis not present

## 2021-01-28 DIAGNOSIS — E039 Hypothyroidism, unspecified: Secondary | ICD-10-CM | POA: Diagnosis not present

## 2021-01-29 DIAGNOSIS — E039 Hypothyroidism, unspecified: Secondary | ICD-10-CM | POA: Diagnosis not present

## 2021-01-30 DIAGNOSIS — E039 Hypothyroidism, unspecified: Secondary | ICD-10-CM | POA: Diagnosis not present

## 2021-01-31 ENCOUNTER — Other Ambulatory Visit: Payer: Self-pay

## 2021-01-31 ENCOUNTER — Ambulatory Visit (INDEPENDENT_AMBULATORY_CARE_PROVIDER_SITE_OTHER): Payer: Medicaid Other | Admitting: *Deleted

## 2021-01-31 ENCOUNTER — Other Ambulatory Visit: Payer: Self-pay | Admitting: Physician Assistant

## 2021-01-31 ENCOUNTER — Other Ambulatory Visit: Payer: Self-pay | Admitting: Family Medicine

## 2021-01-31 DIAGNOSIS — Z86718 Personal history of other venous thrombosis and embolism: Secondary | ICD-10-CM

## 2021-01-31 DIAGNOSIS — E039 Hypothyroidism, unspecified: Secondary | ICD-10-CM | POA: Diagnosis not present

## 2021-01-31 LAB — POCT INR
INR: 2.3 (ref 2.0–3.0)
PT: 27.3

## 2021-01-31 NOTE — Telephone Encounter (Signed)
Requested medication (s) are due for refill today: Yes  Requested medication (s) are on the active medication list: Yes  Last refill:  12/26/20  Future visit scheduled: No  Notes to clinic:  Historical provider.    Requested Prescriptions  Pending Prescriptions Disp Refills   warfarin (COUMADIN) 5 MG tablet [Pharmacy Med Name: warfarin 5 mg tablet] 60 tablet 1    Sig: TAKE 1 AND 1/2 TABLETS BY MOUTH ON TUEDAYS AND THURSDAYS, THEN TAKE 1 TABLET DAILY ON ALL OTHER DAYS      Hematology:  Anticoagulants - warfarin Failed - 01/31/2021  8:01 AM      Failed - This refill cannot be delegated      Failed - If the patient is managed by [facility] - route to their Pool. If not, forward to the provider.      Failed - INR in normal range and within 30 days    INR  Date Value Ref Range Status  01/17/2021 1.9 (A) 2.0 - 3.0 Final    Comment:    Continue 5mg  daily except 2.5mg  M W.  Recheck in two weeks.   12/06/2020 2.8 (H) 0.8 - 1.2 Final    Comment:    (NOTE) INR goal varies based on device and disease states. Performed at [facility], [address], [place], [place] [address]           Passed - Valid encounter within last 3 months    Recent Outpatient Visits           3 weeks ago History of recurrent deep vein thrombosis (DVT)   [facility] [place], Dionne Bucy, MD   1 month ago [place], [place], PA-C   1 month ago Pneumonia due to COVID-19 virus   [facility], Wendee Beavers, PA-C   1 month ago [place], [place], PA-C   3 months ago Pre-op evaluation   [facility] [place], Dionne Bucy, MD       Future Appointments             In 2 months Derek Jack, MD [facility]

## 2021-01-31 NOTE — Telephone Encounter (Signed)
Requested Prescriptions  Pending Prescriptions Disp Refills  . pantoprazole (PROTONIX) 40 MG tablet [Pharmacy Med Name: pantoprazole 40 mg tablet,delayed release] 90 tablet 3    Sig: TAKE 1 TABLET BY MOUTH DAILY     Gastroenterology: Proton Pump Inhibitors Passed - 01/31/2021  8:01 AM      Passed - Valid encounter within last 12 months    Recent Outpatient Visits          3 weeks ago History of recurrent deep vein thrombosis (DVT)   [facility] [place], Dionne Bucy, MD   1 month ago [place], [place], PA-C   1 month ago Pneumonia due to COVID-19 virus   [place], Wendee Beavers, PA-C   1 month ago [place], [place], PA-C   3 months ago Pre-op evaluation   [facility] [place], Dionne Bucy, MD      Future Appointments            In 2 months Derek Jack, MD [facility]

## 2021-01-31 NOTE — Patient Instructions (Signed)
Continue 5mg  daily except 2.5mg  Monday, Wednesday.  Recheck in 4 weeks.

## 2021-02-01 ENCOUNTER — Ambulatory Visit (HOSPITAL_COMMUNITY): Payer: Medicaid Other | Admitting: Hematology

## 2021-02-01 DIAGNOSIS — E039 Hypothyroidism, unspecified: Secondary | ICD-10-CM | POA: Diagnosis not present

## 2021-02-02 DIAGNOSIS — E039 Hypothyroidism, unspecified: Secondary | ICD-10-CM | POA: Diagnosis not present

## 2021-02-03 DIAGNOSIS — E039 Hypothyroidism, unspecified: Secondary | ICD-10-CM | POA: Diagnosis not present

## 2021-02-04 DIAGNOSIS — E039 Hypothyroidism, unspecified: Secondary | ICD-10-CM | POA: Diagnosis not present

## 2021-02-05 ENCOUNTER — Ambulatory Visit (INDEPENDENT_AMBULATORY_CARE_PROVIDER_SITE_OTHER): Payer: Medicaid Other | Admitting: Family Medicine

## 2021-02-05 ENCOUNTER — Encounter (INDEPENDENT_AMBULATORY_CARE_PROVIDER_SITE_OTHER): Payer: Self-pay | Admitting: Family Medicine

## 2021-02-05 ENCOUNTER — Other Ambulatory Visit: Payer: Self-pay

## 2021-02-05 VITALS — BP 122/72 | HR 65 | Temp 97.8°F | Ht 70.0 in | Wt 262.0 lb

## 2021-02-05 DIAGNOSIS — E038 Other specified hypothyroidism: Secondary | ICD-10-CM | POA: Diagnosis not present

## 2021-02-05 DIAGNOSIS — Z6841 Body Mass Index (BMI) 40.0 and over, adult: Secondary | ICD-10-CM

## 2021-02-05 DIAGNOSIS — E039 Hypothyroidism, unspecified: Secondary | ICD-10-CM | POA: Diagnosis not present

## 2021-02-05 DIAGNOSIS — E559 Vitamin D deficiency, unspecified: Secondary | ICD-10-CM

## 2021-02-05 MED ORDER — VITAMIN D (ERGOCALCIFEROL) 1.25 MG (50000 UNIT) PO CAPS: 50000.0000 [IU] | ORAL_CAPSULE | ORAL | 0 refills | Status: DC

## 2021-02-06 DIAGNOSIS — E039 Hypothyroidism, unspecified: Secondary | ICD-10-CM | POA: Diagnosis not present

## 2021-02-07 DIAGNOSIS — E039 Hypothyroidism, unspecified: Secondary | ICD-10-CM | POA: Diagnosis not present

## 2021-02-08 DIAGNOSIS — E039 Hypothyroidism, unspecified: Secondary | ICD-10-CM | POA: Diagnosis not present

## 2021-02-09 DIAGNOSIS — E039 Hypothyroidism, unspecified: Secondary | ICD-10-CM | POA: Diagnosis not present

## 2021-02-10 DIAGNOSIS — E039 Hypothyroidism, unspecified: Secondary | ICD-10-CM | POA: Diagnosis not present

## 2021-02-10 NOTE — Progress Notes (Signed)
Chief Complaint:   OBESITY Jane [name] is here to discuss her progress with her obesity treatment plan along with follow-up of her obesity related diagnoses. Jane [name] is on the Category 4 Plan and states she is following her eating plan approximately 50% of the time. Jane [name] states she is not currently exercising.  Today's visit was #: 70 Starting weight: 283 lbs Starting date: 03/29/2020 Today's weight: 262 lbs Today's date: 02/05/2021 Total lbs lost to date: 21 lbs Total lbs lost since last in-office visit: 2  Interim History: Jane [name] is averaging ~4,000 steps per day. She is still experiencing left leg pain and swelling that was not improved with surgery. Pt voices about half the tine she is struggling to adhere to the meal plan. She mentions she is currently struggling with familial issues. She is having migraines after taking Topamax.  Subjective:   1. Vitamin D deficiency Jane [name] denies nausea, vomiting, and muscle weakness but notes fatigue. Pt is on prescription Vit D.  2. Other specified hypothyroidism Jane [name] denies cold or hot intolerances or palpitations. She is on levothyroxine. Her last TSH was within normal limits.  Assessment/Plan:   1. Vitamin D deficiency Low Vitamin D level contributes to fatigue and are associated with obesity, breast, and colon cancer. She agrees to continue to take prescription Vitamin D @50 ,000 IU every week and will follow-up for routine testing of Vitamin D, at least 2-3 times per year to avoid over-replacement.  - Vitamin D, Ergocalciferol, (DRISDOL) 1.25 MG (50000 UNIT) CAPS capsule; Take 1 capsule (50,000 Units total) by mouth every 7 (seven) days.  Dispense: 4 capsule; Refill: 0  2. Other specified hypothyroidism Patient with long-standing hypothyroidism, on levothyroxine therapy. She appears euthyroid. Orders and follow up as documented in patient record. Continue levothyroxine as directed.  Counseling . Good thyroid control is important for  overall health. Supratherapeutic thyroid levels are dangerous and will not improve weight loss results. . The correct way to take levothyroxine is fasting, with water, separated by at least 30 minutes from breakfast, and separated by more than 4 hours from calcium, iron, multivitamins, acid reflux medications (PPIs).   3. Class 3 severe obesity with serious comorbidity and body mass index (BMI) of 40.0 to 44.9 in adult, unspecified obesity type (HCC) Jane [name] is currently in the action stage of change. As such, her goal is to continue with weight loss efforts. She has agreed to the Category 4 Plan.   Exercise goals: No exercise has been prescribed at this time.  Behavioral modification strategies: increasing lean protein intake, no skipping meals and meal planning and cooking strategies.  Jane [name] has agreed to follow-up with our clinic in 3 weeks. She was informed of the importance of frequent follow-up visits to maximize her success with intensive lifestyle modifications for her multiple health conditions.   Objective:   Blood pressure 122/72, pulse 65, temperature 97.8 F (36.6 C), height 5\' 10"  (1.778 m), weight 262 lb (118.8 kg), SpO2 99 %. Body mass index is 37.59 kg/m.  General: Cooperative, alert, well developed, in no acute distress. HEENT: Conjunctivae and lids unremarkable. Cardiovascular: Regular rhythm.  Lungs: Normal work of breathing. Neurologic: No focal deficits.   Lab Results  Component Value Date   CREATININE 1.36 (H) 01/01/2021   BUN 32 (H) 01/01/2021   NA 140 01/01/2021   K 3.9 01/01/2021   CL 100 01/01/2021   CO2 29 01/01/2021   Lab Results  Component Value Date   ALT 19 01/01/2021   AST  20 01/01/2021   ALKPHOS 84 01/01/2021   BILITOT 0.4 01/01/2021   Lab Results  Component Value Date   HGBA1C 5.8 (A) 10/24/2020   HGBA1C 6.2 (H) 03/29/2020   HGBA1C 6.3 (H) 10/25/2019   HGBA1C 6.3 (H) 03/26/2019   HGBA1C 7.5 11/18/2018   Lab Results  Component Value  Date   INSULIN 21.5 03/29/2020   Lab Results  Component Value Date   TSH 0.107 (L) 05/18/2020   Lab Results  Component Value Date   CHOL 203 (H) 03/29/2020   HDL 37 (L) 03/29/2020   LDLCALC 114 (H) 03/29/2020   TRIG 296 (H) 03/29/2020   CHOLHDL 5.0 (H) 10/25/2019   Lab Results  Component Value Date   WBC 5.3 01/01/2021   HGB 12.7 01/01/2021   HCT 39.5 01/01/2021   MCV 95.0 01/01/2021   PLT 176 01/01/2021   Lab Results  Component Value Date   IRON 74 01/01/2021   TIBC 301 01/01/2021   FERRITIN 203 01/01/2021    Attestation Statements:   Reviewed by clinician on day of visit: allergies, medications, problem list, medical history, surgical history, family history, social history, and previous encounter notes.  Coral Ceo, am acting as transcriptionist for Coralie Common, MD.  I have reviewed the above documentation for accuracy and completeness, and I agree with the above. - Jinny Blossom, MD

## 2021-02-11 DIAGNOSIS — E039 Hypothyroidism, unspecified: Secondary | ICD-10-CM | POA: Diagnosis not present

## 2021-02-12 DIAGNOSIS — E039 Hypothyroidism, unspecified: Secondary | ICD-10-CM | POA: Diagnosis not present

## 2021-02-13 DIAGNOSIS — E039 Hypothyroidism, unspecified: Secondary | ICD-10-CM | POA: Diagnosis not present

## 2021-02-14 DIAGNOSIS — E039 Hypothyroidism, unspecified: Secondary | ICD-10-CM | POA: Diagnosis not present

## 2021-02-15 DIAGNOSIS — E039 Hypothyroidism, unspecified: Secondary | ICD-10-CM | POA: Diagnosis not present

## 2021-02-16 DIAGNOSIS — E039 Hypothyroidism, unspecified: Secondary | ICD-10-CM | POA: Diagnosis not present

## 2021-02-17 DIAGNOSIS — E039 Hypothyroidism, unspecified: Secondary | ICD-10-CM | POA: Diagnosis not present

## 2021-02-18 DIAGNOSIS — E039 Hypothyroidism, unspecified: Secondary | ICD-10-CM | POA: Diagnosis not present

## 2021-02-19 ENCOUNTER — Telehealth (HOSPITAL_COMMUNITY): Payer: Medicaid Other | Admitting: Psychiatry

## 2021-02-19 DIAGNOSIS — L600 Ingrowing nail: Secondary | ICD-10-CM | POA: Diagnosis not present

## 2021-02-19 DIAGNOSIS — M7752 Other enthesopathy of left foot: Secondary | ICD-10-CM | POA: Diagnosis not present

## 2021-02-19 DIAGNOSIS — E039 Hypothyroidism, unspecified: Secondary | ICD-10-CM | POA: Diagnosis not present

## 2021-02-20 ENCOUNTER — Telehealth: Payer: Medicaid Other | Admitting: Family Medicine

## 2021-02-20 DIAGNOSIS — E039 Hypothyroidism, unspecified: Secondary | ICD-10-CM | POA: Diagnosis not present

## 2021-02-21 DIAGNOSIS — E039 Hypothyroidism, unspecified: Secondary | ICD-10-CM | POA: Diagnosis not present

## 2021-02-22 DIAGNOSIS — E039 Hypothyroidism, unspecified: Secondary | ICD-10-CM | POA: Diagnosis not present

## 2021-02-23 DIAGNOSIS — E039 Hypothyroidism, unspecified: Secondary | ICD-10-CM | POA: Diagnosis not present

## 2021-02-24 DIAGNOSIS — E039 Hypothyroidism, unspecified: Secondary | ICD-10-CM | POA: Diagnosis not present

## 2021-02-25 DIAGNOSIS — E039 Hypothyroidism, unspecified: Secondary | ICD-10-CM | POA: Diagnosis not present

## 2021-02-26 ENCOUNTER — Encounter (HOSPITAL_COMMUNITY): Payer: Self-pay | Admitting: Psychiatry

## 2021-02-26 ENCOUNTER — Other Ambulatory Visit: Payer: Self-pay

## 2021-02-26 ENCOUNTER — Telehealth (INDEPENDENT_AMBULATORY_CARE_PROVIDER_SITE_OTHER): Payer: Medicaid Other | Admitting: Psychiatry

## 2021-02-26 DIAGNOSIS — F3130 Bipolar disorder, current episode depressed, mild or moderate severity, unspecified: Secondary | ICD-10-CM

## 2021-02-26 DIAGNOSIS — E039 Hypothyroidism, unspecified: Secondary | ICD-10-CM | POA: Diagnosis not present

## 2021-02-26 DIAGNOSIS — F900 Attention-deficit hyperactivity disorder, predominantly inattentive type: Secondary | ICD-10-CM | POA: Diagnosis not present

## 2021-02-26 MED ORDER — AMPHETAMINE-DEXTROAMPHETAMINE 30 MG PO TABS: 30.0000 mg | ORAL_TABLET | Freq: Two times a day (BID) | ORAL | 0 refills | Status: DC

## 2021-02-26 MED ORDER — AMPHETAMINE-DEXTROAMPHETAMINE 30 MG PO TABS
30.0000 mg | ORAL_TABLET | Freq: Two times a day (BID) | ORAL | 0 refills | Status: DC
Start: 2021-02-26 — End: 2021-05-28

## 2021-02-26 MED ORDER — FLUOXETINE HCL 40 MG PO CAPS
ORAL_CAPSULE | ORAL | 2 refills | Status: DC
Start: 2021-02-26 — End: 2021-05-28

## 2021-02-26 MED ORDER — ALPRAZOLAM 1 MG PO TABS
1.0000 mg | ORAL_TABLET | Freq: Two times a day (BID) | ORAL | 2 refills | Status: DC | PRN
Start: 2021-02-26 — End: 2021-05-28

## 2021-02-26 NOTE — Progress Notes (Signed)
Virtual Visit via Video Note  I connected with Jane [name] on 02/26/21 at 11:00 AM EDT by a video enabled telemedicine application and verified that I am speaking with the correct person using two identifiers.  Location: Patient: home Provider: home office   I discussed the limitations of evaluation and management by telemedicine and the availability of in person appointments. The patient expressed understanding and agreed to proceed    I discussed the assessment and treatment plan with the patient. The patient was provided an opportunity to ask questions and all were answered. The patient agreed with the plan and demonstrated an understanding of the instructions.   The patient was advised to call back or seek an in-person evaluation if the symptoms worsen or if the condition fails to improve as anticipated.  I provided 15 minutes of non-face-to-face time during this encounter.   [name], MD  [facility] MD/PA/NP OP Progress Note  02/26/2021 11:14 AM Jane [name]  MRN:  [id number]  Chief Complaint:  Chief Complaint    ADD; Anxiety; Depression     HPI: This patient is a 55year-old divorced white female who lives alone in [place]. She is on disability due to bipolar disorder.  The patient states that she became very depressed when her mother died in 28-Feb-2000 of a brain aneurysm. She started getting. Sad but eventually became manic and develop mood swings and agitated behavior. She's been on numerous medications over the years. In 1988-02-28 she also had postpartum depression. For many years she was on lithium but it caused a bad taste in her mouth and her doctor was worried it might be starting to affect her renal function so was discontinued. She also thinks that it stopped working after a number of years  The patient returns for follow-up after 3 months.  Since I last saw her she has had COVID-pneumonia but has recovered.  Her health is a bit more stable now although she is still followed  for thromboses in her legs.  She states that her mood is stable and she is enjoying her gardening.  She spends time with one of her daughters and grandchildren but not much with the other 2 daughters.  She is okay with this.  She is generally sleeping well on the Adderall helps her focus, Xanax continues to help with her anxiety. Visit Diagnosis:    ICD-10-CM   1. Bipolar I disorder, most recent episode depressed ([place])  F31.30   2. Attention deficit hyperactivity disorder (ADHD), predominantly inattentive type  F90.0     Past Psychiatric History: Long-term outpatient treatment  Past Medical History:  Past Medical History:  Diagnosis Date  . ADD (attention deficit disorder)   . Anxiety   . Back pain   . Bilateral swelling of feet   . Bipolar 1 disorder ([place])   . Bipolar 1 disorder ([place])   . Bipolar disorder ([place])   . Chewing difficulty   . Chronic fatigue syndrome   . Chronic kidney disease    Stage 3 kidney disease;dx by Dr. Sinda Du.   . Constipation   . Depression   . Diabetes mellitus without complication (HCC)    diet controlled  . Dyspnea    with exertion  . GERD (gastroesophageal reflux disease)   . Headache    migraines  . High cholesterol   . History of blood clots   . History of DVT (deep vein thrombosis)    left leg  . Hypertension    states under control  with meds., has been on med. x 2 years  . Hypothyroidism   . Joint pain   . Neuropathy   . Obsessive-compulsive disorder   . Peripheral vascular disease ([place])   . Prediabetes   . Respiratory failure requiring intubation ([place])   . Restless leg syndrome   . Shortness of breath   . Sleep apnea   . Thrombocytopenia ([place]) 09/15/2019  . Trigger thumb of left hand 01/2018  . Trigger thumb of right hand     Past Surgical History:  Procedure Laterality Date  . ABDOMINAL HYSTERECTOMY  06/2016   complete  . APPLICATION OF WOUND VAC Left 04/27/2019   Procedure: APPLICATION OF WOUND VAC LEFT GROIN;   Surgeon: Waynetta Sandy, MD;  Location: [place];  Service: Vascular;  Laterality: Left;  . AV FISTULA PLACEMENT Left 11/24/2018   Procedure: ARTERIOVENOUS (AV) FISTULA CREATION LEFT SFA TO LEFT FEMORAL VEIN;  Surgeon: Waynetta Sandy, MD;  Location: [place];  Service: Vascular;  Laterality: Left;  . BACK SURGERY    . CHOLECYSTECTOMY    . COLONOSCOPY WITH PROPOFOL N/A 11/22/2019   Procedure: COLONOSCOPY WITH PROPOFOL;  Surgeon: [name], MD;  [facility];  Service: Gastroenterology;  Laterality: N/A;  . FEMORAL ARTERY EXPLORATION  03/30/2019   Procedure: Left Common Femoral Artery and Vein Exploration;  Surgeon: Waynetta Sandy, MD;  Location: [place];  Service: Vascular;;  . FEMORAL-FEMORAL BYPASS GRAFT Left 11/24/2018   Procedure: BYPASS GRAFT FEMORAL-FEMORAL VENOUS LEFT TO RIGHT PALMA PROCEDURE USING CRYOVEIN;  Surgeon: Waynetta Sandy, MD;  Location: [place];  Service: Vascular;  Laterality: Left;  . GROIN DEBRIDEMENT Left 04/27/2019   Procedure: GROIN DEBRIDEMENT;  Surgeon: Waynetta Sandy, MD;  Location: [place];  Service: Vascular;  Laterality: Left;  . INSERTION OF ILIAC STENT  03/30/2019   Procedure: Stent of left common, external iliac veins and left common femoral vein;  Surgeon: Waynetta Sandy, MD;  Location: [place];  Service: Vascular;;  . KNEE ARTHROSCOPY WITH MENISCAL REPAIR Left 11/14/2020   Procedure: LEFT KNEE ARTHROSCOPY WITH PARTIAL MEDIAL MENISCECTOMY;  Surgeon: Carole Civil, MD;  Location: [facility];  Service: Orthopedics;  Laterality: Left;  . LOWER EXTREMITY VENOGRAPHY N/A 08/17/2018   Procedure: LOWER EXTREMITY VENOGRAPHY - Central Venogram;  Surgeon: Waynetta Sandy, MD;  Location: [place] [facility];  Service: Cardiovascular;  Laterality: N/A;  . LOWER EXTREMITY VENOGRAPHY Bilateral 03/09/2019   Procedure: LOWER EXTREMITY VENOGRAPHY;  Surgeon: Waynetta Sandy, MD;  Location: [place] CV  LAB;  Service: Cardiovascular;  Laterality: Bilateral;  . LOWER EXTREMITY VENOGRAPHY Left 08/16/2019   Procedure: LOWER EXTREMITY VENOGRAPHY;  Surgeon: Waynetta Sandy, MD;  Location: [place] [facility];  Service: Cardiovascular;  Laterality: Left;  . LUMBAR FUSION  11/21/2000   L5-S1  . LUMBAR SPINE SURGERY     x 2 others  . PATCH ANGIOPLASTY Left 03/30/2019   Procedure: Patch Angioplasty of the Left Common Femoral Vein using Venosure Biologic patch;  Surgeon: Waynetta Sandy, MD;  Location: [place];  Service: Vascular;  Laterality: Left;  . PERIPHERAL VASCULAR INTERVENTION Left 08/16/2019   Procedure: PERIPHERAL VASCULAR INTERVENTION;  Surgeon: Waynetta Sandy, MD;  Location: [place] CV LAB;  Service: Cardiovascular;  Laterality: Left;  common femoral/femoral vein stent  . TRIGGER FINGER RELEASE Right 12/01/2017   Procedure: RELEASE TRIGGER FINGER/A-1 PULLEY RIGHT THUMB;  Surgeon: Leanora Cover, MD;  Location: [place];  Service: Orthopedics;  Laterality:  Right;  Marland Kitchen TRIGGER FINGER RELEASE Left 01/26/2018   Procedure: LEFT TRIGGER THUMB RELEASE;  Surgeon: Leanora Cover, MD;  Location: [place];  Service: Orthopedics;  Laterality: Left;  . ULTRASOUND GUIDANCE FOR VASCULAR ACCESS Right 03/30/2019   Procedure: Ultrasound-guided cannulation right internal jugular vein;  Surgeon: Waynetta Sandy, MD;  [facility] OR;  Service: Vascular;  Laterality: Right;    Family Psychiatric History: see below  Family History:  Family History  Problem Relation Age of [place]  . Heart disease Mother   . Hyperlipidemia Mother   . Hypertension Mother   . Bipolar disorder Mother   . Stroke Mother   . Depression Mother   . Sleep apnea Mother   . Obesity Mother   . Diabetes Father   . Heart disease Father   . Hyperlipidemia Father   . Hypertension Father   . Sleep apnea Father   . Obesity Father   . Drug abuse Daughter   . ADD / ADHD  Daughter   . Drug abuse Daughter   . Anxiety disorder Daughter   . Bipolar disorder Daughter   . Hypertension Sister   . Hypertension Brother   . Hyperlipidemia Brother   . Heart disease Brother   . Bipolar disorder Maternal Aunt   . Suicidality Maternal Aunt     Social History:  Social History   Socioeconomic History  . Marital status: Divorced    Spouse name: Not on file  . Number of children: 3  . Years of education: Not on file  . Highest education level: Not on file  Occupational History  . Not on file  Tobacco Use  . Smoking status: Never Smoker  . Smokeless tobacco: Never Used  Vaping Use  . Vaping Use: Never used  Substance and Sexual Activity  . Alcohol use: No  . Drug use: No  . Sexual activity: Not Currently    Partners: Male    Birth control/protection: Surgical  Other Topics Concern  . Not on file  Social History Narrative  . Not on file   Social Determinants of Health   Financial Resource Strain: High Risk  . Difficulty of Paying Living Expenses: Hard  Food Insecurity: Food Insecurity Present  . Worried About Charity fundraiser in the Last Year: Sometimes true  . Ran Out of Food in the Last Year: Never true  Transportation Needs: No Transportation Needs  . Lack of Transportation (Medical): No  . Lack of Transportation (Non-Medical): No  Physical Activity: Inactive  . Days of Exercise per Week: 0 days  . Minutes of Exercise per Session: 0 min  Stress: Stress Concern Present  . Feeling of Stress : Very much  Social Connections: Socially Isolated  . Frequency of Communication with Friends and Family: More than three times a week  . Frequency of Social Gatherings with Friends and Family: Three times a week  . Attends Religious Services: Never  . Active Member of Clubs or Organizations: No  . Attends Archivist Meetings: Never  . Marital Status: Divorced    Allergies:  Allergies  Allergen Reactions  . Aripiprazole Other (See  Comments)    BECOMES  VIOLENT   . Seroquel [Quetiapine Fumarate] Other (See Comments)    BECOMES VIOLENT  . Chlorpromazine Other (See Comments)    SEVERE ANXIETY   . Gabapentin Other (See Comments)    NIGHTMARES     Metabolic Disorder Labs: Lab Results  Component Value Date   HGBA1C  5.8 (A) 10/24/2020   MPG 134.11 03/26/2019   No results found for: PROLACTIN Lab Results  Component Value Date   CHOL 203 (H) 03/29/2020   TRIG 296 (H) 03/29/2020   HDL 37 (L) 03/29/2020   CHOLHDL 5.0 (H) 10/25/2019   LDLCALC 114 (H) 03/29/2020   LDLCALC 127 (H) 10/25/2019   Lab Results  Component Value Date   TSH 0.107 (L) 05/18/2020   TSH 0.275 (L) 03/29/2020    Therapeutic Level Labs: No results found for: LITHIUM Lab Results  Component Value Date   VALPROATE 61.2 03/23/2014   No components found for:  CBMZ  Current Medications: Current Outpatient Medications  Medication Sig Dispense Refill  . ALPRAZolam (XANAX) 1 MG tablet Take 1 tablet (1 mg total) by mouth 2 (two) times daily as needed for anxiety. 60 tablet 2  . amphetamine-dextroamphetamine (ADDERALL) 30 MG tablet Take 1 tablet by mouth 2 (two) times daily. 60 tablet 0  . amphetamine-dextroamphetamine (ADDERALL) 30 MG tablet Take 1 tablet by mouth 2 (two) times daily. 90 tablet 0  . amphetamine-dextroamphetamine (ADDERALL) 30 MG tablet Take 1 tablet by mouth 2 (two) times daily. 60 tablet 0  . aspirin EC 81 MG tablet Take 81 mg by mouth daily.    Marland Kitchen atorvastatin (LIPITOR) 20 MG tablet Take 1 tablet (20 mg total) by mouth daily. 90 tablet 3  . cetirizine (ZYRTEC) 10 MG tablet TAKE 1 TABLET BY MOUTH DAILY 90 tablet 1  . cycloSPORINE (RESTASIS) 0.05 % ophthalmic emulsion Place 1 drop into both eyes 2 (two) times daily.    Marland Kitchen FLUoxetine (PROZAC) 40 MG capsule TAKE 1 TABLET BY MOUTH DAILY 90 capsule 2  . GARLIC PO Take 0,254 mg by mouth daily.    . hydrochlorothiazide (HYDRODIURIL) 25 MG tablet Take 1 tablet (25 mg total) by mouth  daily. 30 tablet 0  . HYDROcodone-acetaminophen (NORCO) 10-325 MG tablet Take 1 tablet by mouth every 6 (six) hours as needed for severe pain. Must last 30 days. 120 tablet 0  . [START ON 03/22/2021] HYDROcodone-acetaminophen (NORCO) 10-325 MG tablet Take 1 tablet by mouth every 6 (six) hours as needed for severe pain. Must last 30 days. 120 tablet 0  . levothyroxine (SYNTHROID) 125 MCG tablet Take 125 mcg by mouth daily before breakfast.    . linaclotide (LINZESS) 145 MCG CAPS capsule Take 1 capsule (145 mcg total) by mouth daily. 90 capsule 3  . methocarbamol (ROBAXIN) 750 MG tablet Take 1 tablet (750 mg total) by mouth 2 (two) times daily as needed for muscle spasms. 60 tablet 2  . Multiple Vitamin (MULTIVITAMIN) capsule Take 1 capsule by mouth daily.    . Olopatadine HCl 0.2 % SOLN Place 1 drop into both eyes daily.    . Omega-3 Fatty Acids (OMEGA-3 FISH OIL PO) Take 600 mg by mouth daily.    . pantoprazole (PROTONIX) 40 MG tablet TAKE 1 TABLET BY MOUTH DAILY 90 tablet 3  . potassium chloride (KLOR-CON) 10 MEQ tablet Take 1 tablet (10 mEq total) by mouth 3 (three) times daily. 270 tablet 3  . pregabalin (LYRICA) 100 MG capsule Take 100 mg by mouth 4 (four) times daily.    Marland Kitchen rOPINIRole (REQUIP) 2 MG tablet Take 1 tablet (2 mg total) by mouth 2 (two) times daily. 180 tablet 3  . topiramate (TOPAMAX) 25 MG tablet Take 1 tablet (25 mg total) by mouth daily with supper. 30 tablet 0  . Vitamin D, Ergocalciferol, (DRISDOL) 1.25 MG (50000 UNIT) CAPS  capsule Take 1 capsule (50,000 Units total) by mouth every 7 (seven) days. 4 capsule 0  . warfarin (COUMADIN) 5 MG tablet TAKE 1 AND 1/2 TABLETS BY MOUTH ON TUEDAYS AND THURSDAYS, THEN TAKE 1 TABLET DAILY ON ALL OTHER DAYS 60 tablet 1   No current facility-administered medications for this visit.     Musculoskeletal: Strength & Muscle Tone: within normal limits Gait & Station: normal Patient leans: N/A  Psychiatric Specialty Exam: Review of Systems   Musculoskeletal: Positive for arthralgias and myalgias.  All other systems reviewed and are negative.   There were no vitals taken for this visit.There is no height or weight on file to calculate BMI.  General Appearance: Casual and Fairly Groomed  Eye Contact:  Good  Speech:  Clear and Coherent  Volume:  Normal  Mood:  Euthymic  Affect:  Appropriate and Congruent  Thought Process:  Goal Directed  Orientation:  Full (Time, Place, and Person)  Thought Content: WDL   Suicidal Thoughts:  No  Homicidal Thoughts:  No  Memory:  Immediate;   Good Recent;   Good Remote;   Fair  Judgement:  Good  Insight:  Good  Psychomotor Activity:  Normal  Concentration:  Concentration: Good and Attention Span: Good  Recall:  Good  Fund of Knowledge: Good  Language: Good  Akathisia:  No  Handed:  Right  AIMS (if indicated): not done  Assets:  Communication Skills Desire for Improvement Resilience Social Support Talents/Skills  ADL's:  Intact  Cognition: WNL  Sleep:  Good   Screenings: PHQ2-9   Flowsheet Row Video Visit from 02/26/2021 in [place] Office Visit from 12/26/2020 in [place] Office Visit from 10/24/2020 in [facility] Office Visit from 10/05/2020 in [place] Office Visit from 07/11/2020 in [facility]  PHQ-2 Total Score 0 0 2 0 0  PHQ-9 Total Score -- -- 8 -- --    Flowsheet Row Video Visit from 02/26/2021 in [place] No Risk       Assessment and Plan: This patient is a 55 year old female with a history of depression anxiety and ADD.  She seems to be stable.  She will continue Prozac 40 mg daily for depression, Xanax 1 mg twice daily as needed for anxiety and Adderall 30 mg twice daily for ADD.  She will return to see me  in 3 months   [name], MD 02/26/2021, 11:14 AM

## 2021-02-27 ENCOUNTER — Encounter (HOSPITAL_COMMUNITY): Payer: Self-pay

## 2021-02-27 DIAGNOSIS — E039 Hypothyroidism, unspecified: Secondary | ICD-10-CM | POA: Diagnosis not present

## 2021-02-28 ENCOUNTER — Other Ambulatory Visit: Payer: Self-pay

## 2021-02-28 ENCOUNTER — Ambulatory Visit (INDEPENDENT_AMBULATORY_CARE_PROVIDER_SITE_OTHER): Payer: Medicaid Other

## 2021-02-28 DIAGNOSIS — Z86718 Personal history of other venous thrombosis and embolism: Secondary | ICD-10-CM

## 2021-02-28 DIAGNOSIS — E039 Hypothyroidism, unspecified: Secondary | ICD-10-CM | POA: Diagnosis not present

## 2021-02-28 LAB — POCT INR
INR: 2.1 (ref 2.0–3.0)
Prothrombin Time: 24.7

## 2021-02-28 NOTE — Patient Instructions (Signed)
Description   Continue 5mg  daily except 2.5mg  Monday, Wednesday.  Recheck in 4 weeks.

## 2021-03-01 ENCOUNTER — Other Ambulatory Visit: Payer: Self-pay | Admitting: Family Medicine

## 2021-03-01 DIAGNOSIS — E039 Hypothyroidism, unspecified: Secondary | ICD-10-CM | POA: Diagnosis not present

## 2021-03-01 NOTE — Telephone Encounter (Signed)
Requested Prescriptions  Pending Prescriptions Disp Refills  . LINZESS 145 MCG CAPS capsule [Pharmacy Med Name: Linzess 145 mcg capsule] 90 capsule 3    Sig: TAKE ONE CAPSULE BY MOUTH DAILY     Gastroenterology: Irritable Bowel Syndrome Passed - 03/01/2021 10:36 AM      Passed - Valid encounter within last 12 months    Recent Outpatient Visits          1 month ago History of recurrent deep vein thrombosis (DVT)   [facility] [place], Dionne Bucy, MD   2 months ago [place], [place], [place]   2 months ago Pneumonia due to COVID-19 virus   [place] [place], Wendee Beavers, PA-C   2 months ago [place], [place], PA-C   4 months ago Pre-op evaluation   [facility] [place], Dionne Bucy, MD      Future Appointments            In 2 weeks [place], Vickki Muff, PA-C [employer], [place]   In 1 month Derek Jack, MD [facility]

## 2021-03-02 DIAGNOSIS — E039 Hypothyroidism, unspecified: Secondary | ICD-10-CM | POA: Diagnosis not present

## 2021-03-03 DIAGNOSIS — E039 Hypothyroidism, unspecified: Secondary | ICD-10-CM | POA: Diagnosis not present

## 2021-03-04 DIAGNOSIS — E039 Hypothyroidism, unspecified: Secondary | ICD-10-CM | POA: Diagnosis not present

## 2021-03-05 ENCOUNTER — Ambulatory Visit (INDEPENDENT_AMBULATORY_CARE_PROVIDER_SITE_OTHER): Payer: Medicaid Other | Admitting: Family Medicine

## 2021-03-05 DIAGNOSIS — E039 Hypothyroidism, unspecified: Secondary | ICD-10-CM | POA: Diagnosis not present

## 2021-03-05 DIAGNOSIS — G8929 Other chronic pain: Secondary | ICD-10-CM | POA: Diagnosis not present

## 2021-03-05 DIAGNOSIS — S00411A Abrasion of right ear, initial encounter: Secondary | ICD-10-CM | POA: Diagnosis not present

## 2021-03-05 DIAGNOSIS — M545 Low back pain, unspecified: Secondary | ICD-10-CM | POA: Diagnosis not present

## 2021-03-06 DIAGNOSIS — E039 Hypothyroidism, unspecified: Secondary | ICD-10-CM | POA: Diagnosis not present

## 2021-03-06 DIAGNOSIS — M7662 Achilles tendinitis, left leg: Secondary | ICD-10-CM | POA: Diagnosis not present

## 2021-03-07 DIAGNOSIS — E039 Hypothyroidism, unspecified: Secondary | ICD-10-CM | POA: Diagnosis not present

## 2021-03-08 DIAGNOSIS — E039 Hypothyroidism, unspecified: Secondary | ICD-10-CM | POA: Diagnosis not present

## 2021-03-09 DIAGNOSIS — R0600 Dyspnea, unspecified: Secondary | ICD-10-CM | POA: Diagnosis not present

## 2021-03-09 DIAGNOSIS — I10 Essential (primary) hypertension: Secondary | ICD-10-CM | POA: Diagnosis not present

## 2021-03-09 DIAGNOSIS — J189 Pneumonia, unspecified organism: Secondary | ICD-10-CM | POA: Diagnosis not present

## 2021-03-09 DIAGNOSIS — Z79899 Other long term (current) drug therapy: Secondary | ICD-10-CM | POA: Diagnosis not present

## 2021-03-09 DIAGNOSIS — Z20822 Contact with and (suspected) exposure to covid-19: Secondary | ICD-10-CM | POA: Diagnosis not present

## 2021-03-09 DIAGNOSIS — E039 Hypothyroidism, unspecified: Secondary | ICD-10-CM | POA: Diagnosis not present

## 2021-03-09 DIAGNOSIS — R059 Cough, unspecified: Secondary | ICD-10-CM | POA: Diagnosis not present

## 2021-03-10 DIAGNOSIS — E039 Hypothyroidism, unspecified: Secondary | ICD-10-CM | POA: Diagnosis not present

## 2021-03-13 ENCOUNTER — Telehealth: Payer: Self-pay

## 2021-03-13 NOTE — Telephone Encounter (Signed)
Patient is scheduled for 03/22/21. Patient does want to schedule an appt sooner. She did agree to schedule virtual visit with [facility] or [employer].

## 2021-03-13 NOTE — Telephone Encounter (Signed)
Copied from [place] [id number]. Topic: General - Other >> Mar 13, 2021  1:27 PM Keene Breath wrote: Reason for CRM: Patient called to ask the nurse to call her regarding her visit at the ER for pneumonia.  She stated they gave her an antibiotic and sent her home.  Patient said she does not feel that she is getting better and would like advice as to what she should do.  Please advise.  CB# [phone]

## 2021-03-13 NOTE — Telephone Encounter (Signed)
Will you please follow up on this. I sent a message to [facility] so that they can schedule her for virtual visit. If they cant see her can you please try [place]. Thank you.

## 2021-03-13 NOTE — Telephone Encounter (Signed)
Would recommend visit to reassess.  Can be with any provider in our office.  Since it is an acute issue, could also be with [facility] or [facility] if we do not have any availability

## 2021-03-14 ENCOUNTER — Encounter: Payer: Self-pay | Admitting: Nurse Practitioner

## 2021-03-14 ENCOUNTER — Telehealth (INDEPENDENT_AMBULATORY_CARE_PROVIDER_SITE_OTHER): Payer: Medicaid Other | Admitting: Nurse Practitioner

## 2021-03-14 ENCOUNTER — Telehealth: Payer: Self-pay

## 2021-03-14 DIAGNOSIS — J189 Pneumonia, unspecified organism: Secondary | ICD-10-CM

## 2021-03-14 MED ORDER — AMOXICILLIN-POT CLAVULANATE 875-125 MG PO TABS: 1.0000 | ORAL_TABLET | Freq: Two times a day (BID) | ORAL | 0 refills | Status: AC

## 2021-03-14 NOTE — Progress Notes (Signed)
LMP  (LMP Unknown)    Subjective:    Patient ID: Rosslyn Pasion, female    DOB: 11-13-1965, 55 y.o.   MRN: [id number]  HPI: Katia Hannen is a 55 y.o. female  Chief Complaint  Patient presents with  . Pneumonia    Patient was seen at the ER on Friday in [place], was diagnosed with Pneumonia, patient states that she is not feeling any better  Still having a deep cough, wheezing, headache, dizziness, lightheaded and nasal congestion   UPPER RESPIRATORY TRACT INFECTION Worst symptom: Cough, wheezing, headache, dizziness, lightheaded, and nasal congestion.  Patient was seen in the ER on Friday and treated for pneumonia with Doxycycline.  Symptoms have not improved.  This is patient's second time having pneumonia this year. She is not a smoker.   Fever: no Cough: yes Shortness of breath: yes Wheezing: yes Chest pain: yes, with cough Chest tightness: yes Chest congestion: yes Nasal congestion: yes Runny nose: no Post nasal drip: no Sneezing: no Sore throat: from coughing Swollen glands: no Sinus pressure: no Headache: yes Face pain: no Toothache: no Ear pain: no bilateral Ear pressure: no bilateral Eyes red/itching:no Eye drainage/crusting: no  Vomiting: no Rash: no Fatigue: yes Sick contacts: no Strep contacts: no  Context: better, worse, stable and fluctuating Recurrent sinusitis: no Relief with OTC cold/cough medications: no  Treatments attempted: none   Relevant past medical, surgical, family and social history reviewed and updated as indicated. Interim medical history since our last visit reviewed. Allergies and medications reviewed and updated.  Review of Systems  Constitutional: Positive for fatigue. Negative for fever.  HENT: Positive for congestion and sore throat. Negative for dental problem, ear pain, postnasal drip, rhinorrhea, sinus pressure, sinus pain and sneezing.   Respiratory: Positive for cough, shortness of breath and wheezing.   Cardiovascular:  Negative for chest pain.  Gastrointestinal: Negative for vomiting.  Skin: Negative for rash.  Neurological: Positive for dizziness, light-headedness and headaches.    Per HPI unless specifically indicated above     Objective:    LMP  (LMP Unknown)   Wt Readings from Last 3 Encounters:  02/05/21 262 lb (118.8 kg)  01/18/21 263 lb (119.3 kg)  01/02/21 268 lb (121.6 kg)    Physical Exam Vitals and nursing note reviewed.  Constitutional:      General: She is not in acute distress.    Appearance: She is not ill-appearing.  HENT:     Head: Normocephalic.     Right Ear: Hearing normal.     Left Ear: Hearing normal.     Nose: Nose normal.  Pulmonary:     Effort: Pulmonary effort is normal. No respiratory distress.  Neurological:     Mental Status: She is alert.  Psychiatric:        Mood and Affect: Mood normal.        Behavior: Behavior normal.        Thought Content: Thought content normal.        Judgment: Judgment normal.     Results for orders placed or performed in visit on 02/28/21  POCT INR  Result Value Ref Range   INR 2.1 2.0 - 3.0   Prothrombin Time 24.7       Assessment & Plan:   Problem List Items Addressed This Visit   None   Visit Diagnoses    Pneumonia of left lung due to infectious organism, unspecified part of lung    -  Primary   Complete course  of augmentin and doxycycline. Return to clinic in 2 weeks to have lungs listened to. Return to ER if SOB worsens.   Relevant Medications   doxycycline (VIBRAMYCIN) 100 MG capsule   HM TUSSIN ADULT DM 100-10 MG/5ML liquid   PROAIR HFA 108 (90 Base) MCG/ACT inhaler   amoxicillin-clavulanate (AUGMENTIN) 875-125 MG tablet       Follow up plan: Return in about 2 weeks (around 03/28/2021) for lung check.    This visit was completed via MyChart due to the restrictions of the COVID-19 pandemic. All issues as above were discussed and addressed. Physical exam was done as above through visual confirmation on  MyChart. If it was felt that the patient should be evaluated in the office, they were directed there. The patient verbally consented to this visit. 1. Location of the patient: Home 2. Location of the provider: Office 3. Those involved with this call:  ? Provider: Jon Billings, NP ? CMA: Tiffany Reel, CMA ? Front Desk/Registration: [place] 4. Time spent on call: 15 minutes with patient face to face via video conference. More than 50% of this time was spent in counseling and coordination of care. 20 minutes total spent in review of patient's record and preparation of their chart.

## 2021-03-14 NOTE — Telephone Encounter (Signed)
Pt has an appointment today (03/14/2021) with [facility] at [place].   Thanks,   -Mickel Baas

## 2021-03-15 ENCOUNTER — Ambulatory Visit: Payer: Medicaid Other | Admitting: Family Medicine

## 2021-03-20 DIAGNOSIS — M7662 Achilles tendinitis, left leg: Secondary | ICD-10-CM | POA: Diagnosis not present

## 2021-03-22 ENCOUNTER — Other Ambulatory Visit: Payer: Self-pay | Admitting: Family Medicine

## 2021-03-22 ENCOUNTER — Other Ambulatory Visit: Payer: Self-pay

## 2021-03-22 ENCOUNTER — Ambulatory Visit
Admission: RE | Admit: 2021-03-22 | Discharge: 2021-03-22 | Disposition: A | Discharge status: Home / Self Care | Payer: Medicaid Other | Source: Ambulatory Visit | Attending: Family Medicine | Admitting: Family Medicine

## 2021-03-22 ENCOUNTER — Ambulatory Visit
Admission: RE | Admit: 2021-03-22 | Discharge: 2021-03-22 | Disposition: A | Discharge status: Home / Self Care | Payer: Medicaid Other | Attending: Family Medicine | Admitting: Family Medicine

## 2021-03-22 ENCOUNTER — Ambulatory Visit (INDEPENDENT_AMBULATORY_CARE_PROVIDER_SITE_OTHER): Payer: Medicaid Other | Admitting: Family Medicine

## 2021-03-22 ENCOUNTER — Encounter: Payer: Self-pay | Admitting: Family Medicine

## 2021-03-22 VITALS — BP 154/82 | HR 56 | Ht 70.0 in | Wt 273.4 lb

## 2021-03-22 DIAGNOSIS — J189 Pneumonia, unspecified organism: Secondary | ICD-10-CM | POA: Insufficient documentation

## 2021-03-22 DIAGNOSIS — R3589 Other polyuria: Secondary | ICD-10-CM

## 2021-03-22 DIAGNOSIS — E1122 Type 2 diabetes mellitus with diabetic chronic kidney disease: Secondary | ICD-10-CM | POA: Diagnosis not present

## 2021-03-22 DIAGNOSIS — N183 Chronic kidney disease, stage 3 unspecified: Secondary | ICD-10-CM | POA: Diagnosis not present

## 2021-03-22 LAB — POCT URINALYSIS DIPSTICK
Bilirubin, UA: NEGATIVE
Glucose, UA: NEGATIVE
Ketones, UA: NEGATIVE
Leukocytes, UA: NEGATIVE
Nitrite, UA: NEGATIVE
Protein, UA: POSITIVE — AB
Spec Grav, UA: 1.02 (ref 1.010–1.025)
Urobilinogen, UA: 0.2 E.U./dL
pH, UA: 6 (ref 5.0–8.0)

## 2021-03-22 NOTE — Progress Notes (Signed)
Established patient visit   Patient: Jane [name]   DOB: 1966-08-30   55 y.o. Female  MRN: [id number] Visit Date: 03/22/2021  Today's healthcare provider: Vernie Murders, PA-C   Chief Complaint  Patient presents with  . Polyuria  . Pneumonia   Subjective    Stage 3 kidney failure  Patient cannot hold urine x more than one month.   Pneumonia Patient presents with coughing, crackling, painful deep breaths, and feels congested in chest x 3 weeks. Patient was seen at [facility] Emergency Dept on 03/09/2021. Radiological imaging confirmed patient had pneumonia. Patient was prescribed doxycycline and has finished this Rx in the planned 10 days but never was able to obtain the amoxicillin prescribed to her due to the pharmacy and therefore never took the Amoxicillin.    Past Medical History:  Diagnosis Date  . ADD (attention deficit disorder)   . Anxiety   . Back pain   . Bilateral swelling of feet   . Bipolar 1 disorder ([place])   . Bipolar 1 disorder ([place])   . Bipolar disorder ([place])   . Chewing difficulty   . Chronic fatigue syndrome   . Chronic kidney disease    Stage 3 kidney disease;dx by Dr. Sinda Du.   . Constipation   . Depression   . Diabetes mellitus without complication (HCC)    diet controlled  . Dyspnea    with exertion  . GERD (gastroesophageal reflux disease)   . Headache    migraines  . High cholesterol   . History of blood clots   . History of DVT (deep vein thrombosis)    left leg  . Hypertension    states under control with meds., has been on med. x 2 years  . Hypothyroidism   . Joint pain   . Neuropathy   . Obsessive-compulsive disorder   . Peripheral vascular disease ([place])   . Prediabetes   . Respiratory failure requiring intubation ([place])   . Restless leg syndrome   . Shortness of breath   . Sleep apnea   . Thrombocytopenia ([place]) 09/15/2019  . Trigger thumb of left hand 01/2018  . Trigger thumb of right hand    Past Surgical  History:  Procedure Laterality Date  . ABDOMINAL HYSTERECTOMY  06/2016   complete  . APPLICATION OF WOUND VAC Left 04/27/2019   Procedure: APPLICATION OF WOUND VAC LEFT GROIN;  Surgeon: Waynetta Sandy, MD;  Location: [place];  Service: Vascular;  Laterality: Left;  . AV FISTULA PLACEMENT Left 11/24/2018   Procedure: ARTERIOVENOUS (AV) FISTULA CREATION LEFT SFA TO LEFT FEMORAL VEIN;  Surgeon: Waynetta Sandy, MD;  Location: [place];  Service: Vascular;  Laterality: Left;  . BACK SURGERY    . CHOLECYSTECTOMY    . COLONOSCOPY WITH PROPOFOL N/A 11/22/2019   Procedure: COLONOSCOPY WITH PROPOFOL;  Surgeon: [name], MD;  Location: [facility] ENDOSCOPY;  Service: Gastroenterology;  Laterality: N/A;  . FEMORAL ARTERY EXPLORATION  03/30/2019   Procedure: Left Common Femoral Artery and Vein Exploration;  Surgeon: Waynetta Sandy, MD;  Location: [place];  Service: Vascular;;  . FEMORAL-FEMORAL BYPASS GRAFT Left 11/24/2018   Procedure: BYPASS GRAFT FEMORAL-FEMORAL VENOUS LEFT TO RIGHT PALMA PROCEDURE USING CRYOVEIN;  Surgeon: Waynetta Sandy, MD;  Location: [place];  Service: Vascular;  Laterality: Left;  . GROIN DEBRIDEMENT Left 04/27/2019   Procedure: GROIN DEBRIDEMENT;  Surgeon: Waynetta Sandy, MD;  Location: [place];  Service: Vascular;  Laterality: Left;  .  INSERTION OF ILIAC STENT  03/30/2019   Procedure: Stent of left common, external iliac veins and left common femoral vein;  Surgeon: Waynetta Sandy, MD;  Location: [place];  Service: Vascular;;  . KNEE ARTHROSCOPY WITH MENISCAL REPAIR Left 11/14/2020   Procedure: LEFT KNEE ARTHROSCOPY WITH PARTIAL MEDIAL MENISCECTOMY;  Surgeon: Carole Civil, MD;  Location: [facility];  Service: Orthopedics;  Laterality: Left;  . LOWER EXTREMITY VENOGRAPHY N/A 08/17/2018   Procedure: LOWER EXTREMITY VENOGRAPHY - Central Venogram;  Surgeon: Waynetta Sandy, MD;  Location: [place] CV LAB;  Service:  Cardiovascular;  Laterality: N/A;  . LOWER EXTREMITY VENOGRAPHY Bilateral 03/09/2019   Procedure: LOWER EXTREMITY VENOGRAPHY;  Surgeon: Waynetta Sandy, MD;  Location: [place] CV LAB;  Service: Cardiovascular;  Laterality: Bilateral;  . LOWER EXTREMITY VENOGRAPHY Left 08/16/2019   Procedure: LOWER EXTREMITY VENOGRAPHY;  Surgeon: Waynetta Sandy, MD;  Location: [place] CV LAB;  Service: Cardiovascular;  Laterality: Left;  . LUMBAR FUSION  11/21/2000   L5-S1  . LUMBAR SPINE SURGERY     x 2 others  . PATCH ANGIOPLASTY Left 03/30/2019   Procedure: Patch Angioplasty of the Left Common Femoral Vein using Venosure Biologic patch;  Surgeon: Waynetta Sandy, MD;  Location: [place];  Service: Vascular;  Laterality: Left;  . PERIPHERAL VASCULAR INTERVENTION Left 08/16/2019   Procedure: PERIPHERAL VASCULAR INTERVENTION;  Surgeon: Waynetta Sandy, MD;  Location: [place] CV [facility];  Service: Cardiovascular;  Laterality: Left;  common femoral/femoral vein stent  . TRIGGER FINGER RELEASE Right 12/01/2017   Procedure: RELEASE TRIGGER FINGER/A-1 PULLEY RIGHT THUMB;  Surgeon: Leanora Cover, MD;  Location: [place];  Service: Orthopedics;  Laterality: Right;  . TRIGGER FINGER RELEASE Left 01/26/2018   Procedure: LEFT TRIGGER THUMB RELEASE;  Surgeon: Leanora Cover, MD;  Location: [place];  Service: Orthopedics;  Laterality: Left;  . ULTRASOUND GUIDANCE FOR VASCULAR ACCESS Right 03/30/2019   Procedure: Ultrasound-guided cannulation right internal jugular vein;  Surgeon: Waynetta Sandy, MD;  Location: [place];  Service: Vascular;  Laterality: Right;   Social History   Tobacco Use  . Smoking status: Never Smoker  . Smokeless tobacco: Never Used  Vaping Use  . Vaping Use: Never used  Substance Use Topics  . Alcohol use: No  . Drug use: No   Family History  Problem Relation Age of Onset  . Heart disease Mother   .  Hyperlipidemia Mother   . Hypertension Mother   . Bipolar disorder Mother   . Stroke Mother   . Depression Mother   . Sleep apnea Mother   . Obesity Mother   . Diabetes Father   . Heart disease Father   . Hyperlipidemia Father   . Hypertension Father   . Sleep apnea Father   . Obesity Father   . Drug abuse Daughter   . ADD / ADHD Daughter   . Drug abuse Daughter   . Anxiety disorder Daughter   . Bipolar disorder Daughter   . Hypertension Sister   . Hypertension Brother   . Hyperlipidemia Brother   . Heart disease Brother   . Bipolar disorder Maternal Aunt   . Suicidality Maternal Aunt    Allergies  Allergen Reactions  . Aripiprazole Other (See Comments)    BECOMES  VIOLENT   . Seroquel [Quetiapine Fumarate] Other (See Comments)    BECOMES VIOLENT  . Chlorpromazine Other (See Comments)    SEVERE ANXIETY   . Gabapentin Other (  See Comments)    NIGHTMARES        Medications: Outpatient Medications Prior to Visit  Medication Sig  . ALPRAZolam (XANAX) 1 MG tablet Take 1 tablet (1 mg total) by mouth 2 (two) times daily as needed for anxiety.  [place] Kitchen amphetamine-dextroamphetamine (ADDERALL) 30 MG tablet Take 1 tablet by mouth 2 (two) times daily.  Marland Kitchen amphetamine-dextroamphetamine (ADDERALL) 30 MG tablet Take 1 tablet by mouth 2 (two) times daily.  Marland Kitchen amphetamine-dextroamphetamine (ADDERALL) 30 MG tablet Take 1 tablet by mouth 2 (two) times daily.  Marland Kitchen aspirin EC 81 MG tablet Take 81 mg by mouth daily.  Marland Kitchen atorvastatin (LIPITOR) 20 MG tablet Take 1 tablet (20 mg total) by mouth daily.  . cetirizine (ZYRTEC) 10 MG tablet TAKE 1 TABLET BY MOUTH DAILY  . cycloSPORINE (RESTASIS) 0.05 % ophthalmic emulsion Place 1 drop into both eyes 2 (two) times daily.  Marland Kitchen FLUoxetine (PROZAC) 40 MG capsule TAKE 1 TABLET BY MOUTH DAILY  . GARLIC PO Take 6,295 mg by mouth daily.  Marland Kitchen HM TUSSIN ADULT DM 100-10 MG/5ML liquid SMARTSIG:15 Milliliter(s) By Mouth 4 Times Daily PRN  . hydrochlorothiazide  (HYDRODIURIL) 25 MG tablet Take 1 tablet (25 mg total) by mouth daily.  Marland Kitchen HYDROcodone-acetaminophen (NORCO) 10-325 MG tablet Take 1 tablet by mouth every 6 (six) hours as needed for severe pain. Must last 30 days.  Marland Kitchen HYDROcodone-acetaminophen (NORCO) 10-325 MG tablet Take 1 tablet by mouth every 6 (six) hours as needed for severe pain. Must last 30 days.  Marland Kitchen levothyroxine (SYNTHROID) 125 MCG tablet Take 125 mcg by mouth daily before breakfast.  . LINZESS 145 MCG CAPS capsule TAKE ONE CAPSULE BY MOUTH DAILY  . methocarbamol (ROBAXIN) 750 MG tablet Take 1 tablet (750 mg total) by mouth 2 (two) times daily as needed for muscle spasms.  . Multiple Vitamin (MULTIVITAMIN) capsule Take 1 capsule by mouth daily.  . Olopatadine HCl 0.2 % SOLN Place 1 drop into both eyes daily.  . Omega-3 Fatty Acids (OMEGA-3 FISH OIL PO) Take 600 mg by mouth daily.  . pantoprazole (PROTONIX) 40 MG tablet TAKE 1 TABLET BY MOUTH DAILY  . potassium chloride (KLOR-CON) 10 MEQ tablet Take 1 tablet (10 mEq total) by mouth 3 (three) times daily.  . pregabalin (LYRICA) 100 MG capsule Take 100 mg by mouth 4 (four) times daily.  Marland Kitchen PROAIR HFA 108 (90 Base) MCG/ACT inhaler SMARTSIG:2 Puff(s) By Mouth Every 4 Hours PRN  . rOPINIRole (REQUIP) 2 MG tablet Take 1 tablet (2 mg total) by mouth 2 (two) times daily.  Marland Kitchen topiramate (TOPAMAX) 25 MG tablet Take 1 tablet (25 mg total) by mouth daily with supper.  . Vitamin D, Ergocalciferol, (DRISDOL) 1.25 MG (50000 UNIT) CAPS capsule Take 1 capsule (50,000 Units total) by mouth every 7 (seven) days.  Marland Kitchen warfarin (COUMADIN) 5 MG tablet TAKE 1 AND 1/2 TABLETS BY MOUTH ON TUEDAYS AND THURSDAYS, THEN TAKE 1 TABLET DAILY ON ALL OTHER DAYS   No facility-administered medications prior to visit.    Review of Systems  Respiratory: Positive for cough and wheezing.   Genitourinary: Positive for urgency.    {Labs  Heme  Chem  Endocrine  Serology  Results Review (optional):[id number]::" "}   Objective     BP (!) 154/82 (BP Location: Left Arm, Patient Position: Sitting, Cuff Size: Large)   Pulse (!) 56   Ht 5\' 10"  (1.778 m)   Wt 273 lb 6.4 oz (124 kg)   LMP  (LMP Unknown)  SpO2 97%   BMI 39.23 kg/m  BP Readings from Last 3 Encounters:  03/22/21 (!) 154/82  02/05/21 122/72  01/18/21 138/79   Wt Readings from Last 3 Encounters:  03/22/21 273 lb 6.4 oz (124 kg)  02/05/21 262 lb (118.8 kg)  01/18/21 263 lb (119.3 kg)       Physical Exam Constitutional:      General: She is not in acute distress.    Appearance: She is well-developed.  HENT:     Head: Normocephalic and atraumatic.     Right Ear: Hearing normal.     Left Ear: Hearing normal.     Nose: Nose normal.  Eyes:     General: Lids are normal. No scleral icterus.       Right eye: No discharge.        Left eye: No discharge.     Conjunctiva/sclera: Conjunctivae normal.  Cardiovascular:     Rate and Rhythm: Normal rate and regular rhythm.     Heart sounds: Normal heart sounds.  Pulmonary:     Effort: Pulmonary effort is normal. No respiratory distress.     Breath sounds: Normal breath sounds.  Abdominal:     General: Bowel sounds are normal.     Palpations: Abdomen is soft.  Musculoskeletal:        General: Normal range of motion.     Cervical back: Neck supple.  Skin:    Findings: No lesion or rash.  Neurological:     Mental Status: She is alert and oriented to person, place, and time.  Psychiatric:        Speech: Speech normal.        Behavior: Behavior normal.        Thought Content: Thought content normal.       Results for orders placed or performed in visit on 03/22/21  POCT Urinalysis Dipstick  Result Value Ref Range   Color, UA yellow    Clarity, UA clear    Glucose, UA Negative Negative   Bilirubin, UA negative    Ketones, UA negative    Spec Grav, UA 1.020 1.010 - 1.025   Blood, UA postive    pH, UA 6.0 5.0 - 8.0   Protein, UA Positive (A) Negative   Urobilinogen, UA 0.2 0.2 or 1.0  E.U./dL   Nitrite, UA negative    Leukocytes, UA Negative Negative   Appearance typical    Odor typical     Assessment & Plan     1. Polyuria Onset over the past week. Will check C&S and follow up diabetes labs. Get C&S to rule out UTI. Recheck pending lab reports. - POCT Urinalysis Dipstick - CBC with Differential/Platelet - Comprehensive metabolic panel - Hemoglobin [name] - Urine Culture  2. Pneumonia of left lung due to infectious organism, unspecified part of lung Some cough and chest discomfort. History of pneumonia 3 weeks ago. Finished the Doxycycline. Recheck CXR and CBC.  - CBC with Differential/Platelet - DG Chest 2 View  3. CKD stage 3 due to type 2 diabetes mellitus (HCC) Some protein and blood in urine today. Recheck labs and diabetes control. - CBC with Differential/Platelet - Comprehensive metabolic panel - Hemoglobin [name]   No follow-ups on file.      I, Rui Wordell, PA-C, have reviewed all documentation for this visit. The documentation on 03/22/21 for the exam, diagnosis, procedures, and orders are all accurate and complete.    Vernie Murders, PA-C  [employer] [phone] (phone)  [phone] (fax)  [place]

## 2021-03-23 LAB — COMPREHENSIVE METABOLIC PANEL
ALT: 19 IU/L (ref 0–32)
AST: 17 IU/L (ref 0–40)
Albumin/Globulin Ratio: 1.5 (ref 1.2–2.2)
Albumin: 4.3 g/dL (ref 3.8–4.9)
Alkaline Phosphatase: 115 IU/L (ref 44–121)
BUN/Creatinine Ratio: 21 (ref 9–23)
BUN: 29 mg/dL — ABNORMAL HIGH (ref 6–24)
Bilirubin Total: 0.4 mg/dL (ref 0.0–1.2)
CO2: 22 mmol/L (ref 20–29)
Calcium: 9.2 mg/dL (ref 8.7–10.2)
Chloride: 104 mmol/L (ref 96–106)
Creatinine, Ser: 1.36 mg/dL — ABNORMAL HIGH (ref 0.57–1.00)
Globulin, Total: 2.9 g/dL (ref 1.5–4.5)
Glucose: 106 mg/dL — ABNORMAL HIGH (ref 65–99)
Potassium: 3.8 mmol/L (ref 3.5–5.2)
Sodium: 142 mmol/L (ref 134–144)
Total Protein: 7.2 g/dL (ref 6.0–8.5)
eGFR: 46 mL/min/{1.73_m2} — ABNORMAL LOW (ref >59)

## 2021-03-23 LAB — CBC WITH DIFFERENTIAL/PLATELET
Basophils Absolute: 0 10*3/uL (ref 0.0–0.2)
Basos: 0 %
EOS (ABSOLUTE): 0.1 10*3/uL (ref 0.0–0.4)
Eos: 1 %
Hematocrit: 41.3 % (ref 34.0–46.6)
Hemoglobin: 13.8 g/dL (ref 11.1–15.9)
Immature Grans (Abs): 0 10*3/uL (ref 0.0–0.1)
Immature Granulocytes: 0 %
Lymphocytes Absolute: 1.4 10*3/uL (ref 0.7–3.1)
Lymphs: 19 %
MCH: 30.2 pg (ref 26.6–33.0)
MCHC: 33.4 g/dL (ref 31.5–35.7)
MCV: 90 fL (ref 79–97)
Monocytes Absolute: 0.7 10*3/uL (ref 0.1–0.9)
Monocytes: 9 %
Neutrophils Absolute: 5.4 10*3/uL (ref 1.4–7.0)
Neutrophils: 71 %
Platelets: 136 10*3/uL — ABNORMAL LOW (ref 150–450)
RBC: 4.57 x10E6/uL (ref 3.77–5.28)
RDW: 13.1 % (ref 11.7–15.4)
WBC: 7.7 10*3/uL (ref 3.4–10.8)

## 2021-03-23 LAB — HEMOGLOBIN A1C
Est. average glucose Bld gHb Est-mCnc: 134 mg/dL
Hgb A1c MFr Bld: 6.3 % — ABNORMAL HIGH (ref 4.8–5.6)

## 2021-03-26 ENCOUNTER — Telehealth: Payer: Self-pay

## 2021-03-26 NOTE — Telephone Encounter (Signed)
Copied from [place] [id number]. Topic: General - Other >> Mar 26, 2021  2:43 PM Yvette Rack wrote: Reason for CRM: Pt requests return call to go over most recent lab results.

## 2021-03-27 ENCOUNTER — Ambulatory Visit (INDEPENDENT_AMBULATORY_CARE_PROVIDER_SITE_OTHER): Payer: Medicaid Other | Admitting: Family Medicine

## 2021-03-27 NOTE — Telephone Encounter (Signed)
Done

## 2021-03-28 ENCOUNTER — Other Ambulatory Visit: Payer: Self-pay

## 2021-03-28 ENCOUNTER — Ambulatory Visit (INDEPENDENT_AMBULATORY_CARE_PROVIDER_SITE_OTHER): Payer: Medicaid Other

## 2021-03-28 DIAGNOSIS — Z86718 Personal history of other venous thrombosis and embolism: Secondary | ICD-10-CM | POA: Diagnosis not present

## 2021-03-28 LAB — POCT INR
INR: 2.1 (ref 2.0–3.0)
PT: 25.3

## 2021-03-28 NOTE — Patient Instructions (Signed)
Description   Continue 5mg  daily except 2.5mg  Monday, Wednesday.  Recheck in 4 weeks.

## 2021-03-29 ENCOUNTER — Telehealth: Payer: Self-pay

## 2021-03-29 LAB — URINE CULTURE

## 2021-03-29 MED ORDER — CIPROFLOXACIN HCL 500 MG PO TABS: 500.0000 mg | ORAL_TABLET | Freq: Two times a day (BID) | ORAL | 0 refills | Status: AC

## 2021-03-29 NOTE — Telephone Encounter (Signed)
Pt advised.  RX sent to [facility].   Thanks,   -Mickel Baas

## 2021-03-29 NOTE — Telephone Encounter (Signed)
-----   Message from Margo Common, PA-C sent at 03/29/2021 12:43 PM EDT ----- Culture identified Enterococcus as the pathogen in this UTI. This bacteria is sensitive to Cipro 500 mg BID #20. Drink extra fluids to flush out urinary tract.

## 2021-03-30 ENCOUNTER — Other Ambulatory Visit: Payer: Self-pay | Admitting: Family Medicine

## 2021-03-30 NOTE — Telephone Encounter (Signed)
Requested medication (s) are due for refill today: yes  Requested medication (s) are on the active medication list: yes  Last refill:  03/29/21  Future visit scheduled: yes  Notes to clinic:  no assigned protocol    Requested Prescriptions  Pending Prescriptions Disp Refills   ciprofloxacin (CIPRO) 500 MG tablet [Pharmacy Med Name: ciprofloxacin 500 mg tablet] 20 tablet 0    Sig: TAKE 1 TABLET BY MOUTH TWICE DAILY FOR 10 DAYS      Off-Protocol Failed - 03/30/2021  9:05 AM      Failed - Medication not assigned to a protocol, review manually.      Passed - Valid encounter within last 12 months    Recent Outpatient Visits           1 week ago Polyuria   [place], PA-C   2 weeks ago Pneumonia of left lung due to infectious organism, unspecified part of lung   [place], NP   2 months ago History of recurrent deep vein thrombosis (DVT)   [facility], Dionne Bucy, MD   3 months ago [place], [place], PA-C   3 months ago Pneumonia due to COVID-19 virus   [place], Wendee Beavers, PA-C       Future Appointments             In 2 months Bac[place], Dionne Bucy, MD [facility], [place]

## 2021-04-01 ENCOUNTER — Other Ambulatory Visit: Payer: Self-pay | Admitting: Family Medicine

## 2021-04-01 DIAGNOSIS — I152 Hypertension secondary to endocrine disorders: Secondary | ICD-10-CM

## 2021-04-01 DIAGNOSIS — E1159 Type 2 diabetes mellitus with other circulatory complications: Secondary | ICD-10-CM

## 2021-04-01 NOTE — Telephone Encounter (Signed)
Requested Prescriptions  Pending Prescriptions Disp Refills  . atorvastatin (LIPITOR) 20 MG tablet [Pharmacy Med Name: atorvastatin 20 mg tablet] 90 tablet 0    Sig: TAKE 1 TABLET BY MOUTH DAILY     Cardiovascular:  Antilipid - Statins Failed - 04/01/2021  8:03 AM      Failed - Total Cholesterol in normal range and within 360 days    Cholesterol, Total  Date Value Ref Range Status  03/29/2020 203 (H) 100 - 199 mg/dL Final         Failed - LDL in normal range and within 360 days    LDL Chol Calc (NIH)  Date Value Ref Range Status  03/29/2020 114 (H) 0 - 99 mg/dL Final         Failed - HDL in normal range and within 360 days    HDL  Date Value Ref Range Status  03/29/2020 37 (L) >39 mg/dL Final         Failed - Triglycerides in normal range and within 360 days    Triglycerides  Date Value Ref Range Status  03/29/2020 296 (H) 0 - 149 mg/dL Final         Passed - Patient is not pregnant      Passed - Valid encounter within last 12 months    Recent Outpatient Visits          1 week ago Polyuria   [facility], Vickki Muff, PA-C   2 weeks ago Pneumonia of left lung due to infectious organism, unspecified part of lung   [place], Santiago Glad, NP   2 months ago History of recurrent deep vein thrombosis (DVT)   [facility], Dionne Bucy, MD   3 months ago [place], [place], PA-C   3 months ago Pneumonia due to COVID-19 virus   [place], Wendee Beavers, PA-C      Future Appointments            In 2 months Bacigalupo, Dionne Bucy, MD [facility]           . hydrochlorothiazide (HYDRODIURIL) 25 MG tablet [Pharmacy Med Name: hydrochlorothiazide 25 mg tablet] 90 tablet 0    Sig: TAKE 1 TABLET BY MOUTH DAILY     Cardiovascular: Diuretics - Thiazide Failed - 04/01/2021  8:03 AM      Failed - Cr in normal range and within 360 days     Creatinine, Ser  Date Value Ref Range Status  03/22/2021 1.36 (H) 0.57 - 1.00 mg/dL Final         Failed - Last BP in normal range    BP Readings from Last 1 Encounters:  03/22/21 (!) 154/82         Passed - Ca in normal range and within 360 days    Calcium  Date Value Ref Range Status  03/22/2021 9.2 8.7 - 10.2 mg/dL Final   Calcium, Ion  Date Value Ref Range Status  08/16/2019 1.18 1.15 - 1.40 mmol/L Final         Passed - K in normal range and within 360 days    Potassium  Date Value Ref Range Status  03/22/2021 3.8 3.5 - 5.2 mmol/L Final         Passed - Na in normal range and within 360 days    Sodium  Date Value Ref Range Status  03/22/2021 142 134 - 144 mmol/L  Final         Passed - Valid encounter within last 6 months    Recent Outpatient Visits          1 week ago Polyuria   [place], PA-C   2 weeks ago Pneumonia of left lung due to infectious organism, unspecified part of lung   [place], NP   2 months ago History of recurrent deep vein thrombosis (DVT)   [facility], Dionne Bucy, MD   3 months ago [place], [place], PA-C   3 months ago Pneumonia due to COVID-19 virus   [place], Wendee Beavers, PA-C      Future Appointments            In 2 months Bacigalupo, Dionne Bucy, MD [facility]           . potassium chloride (KLOR-CON) 10 MEQ tablet [Pharmacy Med Name: potassium chloride ER 10 mEq tablet,extended release(part/cryst)] 90 tablet 0    Sig: TAKE 1 TABLET BY MOUTH THREE TIMES DAILY     Endocrinology:  Minerals - Potassium Supplementation Failed - 04/01/2021  8:03 AM      Failed - Cr in normal range and within 360 days    Creatinine, Ser  Date Value Ref Range Status  03/22/2021 1.36 (H) 0.57 - 1.00 mg/dL Final         Passed - K in normal range and within 360 days     Potassium  Date Value Ref Range Status  03/22/2021 3.8 3.5 - 5.2 mmol/L Final         Passed - Valid encounter within last 12 months    Recent Outpatient Visits          1 week ago Polyuria   [place], PA-C   2 weeks ago Pneumonia of left lung due to infectious organism, unspecified part of lung   [place], NP   2 months ago History of recurrent deep vein thrombosis (DVT)   [facility], Dionne Bucy, MD   3 months ago [place], [place], PA-C   3 months ago Pneumonia due to COVID-19 virus   [place], Wendee Beavers, PA-C      Future Appointments            In 2 months Bacigalupo, Dionne Bucy, MD [facility], [place]

## 2021-04-05 NOTE — Telephone Encounter (Signed)
ERROR

## 2021-04-06 DIAGNOSIS — E119 Type 2 diabetes mellitus without complications: Secondary | ICD-10-CM | POA: Diagnosis not present

## 2021-04-07 DIAGNOSIS — E119 Type 2 diabetes mellitus without complications: Secondary | ICD-10-CM | POA: Diagnosis not present

## 2021-04-08 DIAGNOSIS — E119 Type 2 diabetes mellitus without complications: Secondary | ICD-10-CM | POA: Diagnosis not present

## 2021-04-09 DIAGNOSIS — M79604 Pain in right leg: Secondary | ICD-10-CM | POA: Diagnosis not present

## 2021-04-09 DIAGNOSIS — E119 Type 2 diabetes mellitus without complications: Secondary | ICD-10-CM | POA: Diagnosis not present

## 2021-04-09 DIAGNOSIS — M79605 Pain in left leg: Secondary | ICD-10-CM | POA: Diagnosis not present

## 2021-04-09 DIAGNOSIS — I10 Essential (primary) hypertension: Secondary | ICD-10-CM | POA: Diagnosis not present

## 2021-04-09 DIAGNOSIS — R600 Localized edema: Secondary | ICD-10-CM | POA: Diagnosis not present

## 2021-04-10 ENCOUNTER — Encounter: Payer: Self-pay | Admitting: Student in an Organized Health Care Education/Training Program

## 2021-04-10 ENCOUNTER — Ambulatory Visit
Payer: Medicaid Other | Attending: Student in an Organized Health Care Education/Training Program | Admitting: Student in an Organized Health Care Education/Training Program

## 2021-04-10 ENCOUNTER — Other Ambulatory Visit: Payer: Self-pay

## 2021-04-10 VITALS — BP 173/80 | HR 94 | Temp 97.0°F | Resp 20 | Ht 70.0 in | Wt 270.0 lb

## 2021-04-10 DIAGNOSIS — E119 Type 2 diabetes mellitus without complications: Secondary | ICD-10-CM | POA: Diagnosis not present

## 2021-04-10 DIAGNOSIS — M961 Postlaminectomy syndrome, not elsewhere classified: Secondary | ICD-10-CM | POA: Diagnosis not present

## 2021-04-10 DIAGNOSIS — G894 Chronic pain syndrome: Secondary | ICD-10-CM | POA: Insufficient documentation

## 2021-04-10 DIAGNOSIS — I871 Compression of vein: Secondary | ICD-10-CM | POA: Diagnosis not present

## 2021-04-10 DIAGNOSIS — M5136 Other intervertebral disc degeneration, lumbar region: Secondary | ICD-10-CM | POA: Diagnosis not present

## 2021-04-10 DIAGNOSIS — M1712 Unilateral primary osteoarthritis, left knee: Secondary | ICD-10-CM | POA: Insufficient documentation

## 2021-04-10 DIAGNOSIS — Z0289 Encounter for other administrative examinations: Secondary | ICD-10-CM | POA: Diagnosis not present

## 2021-04-10 DIAGNOSIS — M792 Neuralgia and neuritis, unspecified: Secondary | ICD-10-CM | POA: Diagnosis not present

## 2021-04-10 MED ORDER — HYDROCODONE-ACETAMINOPHEN 10-325 MG PO TABS: 1.0000 | ORAL_TABLET | Freq: Four times a day (QID) | ORAL | 0 refills | Status: AC | PRN

## 2021-04-10 MED ORDER — METHOCARBAMOL 750 MG PO TABS
750.0000 mg | ORAL_TABLET | Freq: Two times a day (BID) | ORAL | 2 refills | Status: DC | PRN
Start: 2021-04-10 — End: 2022-03-26

## 2021-04-10 MED ORDER — HYDROCODONE-ACETAMINOPHEN 10-325 MG PO TABS: 1.0000 | ORAL_TABLET | Freq: Four times a day (QID) | ORAL | 0 refills | Status: DC | PRN

## 2021-04-10 NOTE — Progress Notes (Signed)
PROVIDER NOTE: Information contained herein reflects review and annotations entered in association with encounter. Interpretation of such information and data should be left to medically-trained personnel. Information provided to patient can be located elsewhere in the medical record under "Patient Instructions". Document created using STT-dictation technology, any transcriptional errors that may result from process are unintentional.    Patient: Jane [name]  Service Category: E/M  Provider: Gillis Santa, MD  DOB: 09-10-1966  DOS: 04/10/2021  Specialty: Interventional Pain Management  MRN: [id number]  Setting: Ambulatory outpatient  PCP: [name], MD  Type: Established Patient    Referring Provider: [name], MD  Location: Office  Delivery: Face-to-face     HPI  Jane [name], a 55 y.o. year old female, is here today because of her Chronic pain syndrome [G89.4]. Jane [name] primary complain today is Back Pain (low) and Leg Pain (bilateral) Last encounter: My last encounter with her was on 12/26/20 Pertinent problems: Jane [name] has History of DVT (deep vein thrombosis); Bipolar 1 disorder ([place]); Depression, major, recurrent ([place]); Radicular low back pain; May-Thurner syndrome; Iliac vein stenosis, left; Class 2 severe obesity due to excess calories with serious comorbidity and body mass index (BMI) of 38.0 to 38.9 in adult [facility]); Peripheral neuropathy; Chronic low back pain (Bilateral) w/ sciatica (Bilateral); Chronic anticoagulation (Coumadin); CKD stage 3 due to type 2 diabetes mellitus ([place]); Displacement of lumbar intervertebral disc without myelopathy; Chronic pain syndrome; Pharmacologic therapy; Abnormal MRI, cervical spine (01/08/2017); Abnormal MRI, lumbar spine (05/11/2014); DDD (degenerative disc disease), cervical; DDD (degenerative disc disease), lumbar; Failed back surgical syndrome; Diabetic peripheral neuropathy ([place]); Neurogenic pain; and Chronic  musculoskeletal pain on their pertinent problem list. Pain Assessment: Severity of Chronic pain is reported as a 10-Worst pain ever/10. Location: Back Lower/radiates into both legs. Onset: More than a month ago. Quality: Aching (feels swollen and hard). Timing: Constant. Modifying factor(s): denies. Vitals:  height is $RemoveB'5\' 10"'FdJqxEhy$  (1.778 m) and weight is 270 lb (122.5 kg). Her temperature is 97 F (36.1 C) (abnormal). Her blood pressure is 173/80 (abnormal) and her pulse is 94. Her respiration is 20 and oxygen saturation is 98%.   Reason for encounter: medication management.    ED visit yesterday for bilateral (right>left) leg pain Patient has a history of May Turner syndrome.  A Doppler study was not done yesterday.  I recommend the patient follow-up with her vascular surgeon regarding her leg pain symptoms primarily calf pain and have a lower extremity Doppler done.  Patient endorsed understanding. Otherwise no issues with her medications.  Continues to take them as prescribed.  Helps reduce her pain and allows her to function more comfortably.  Refill hydrocodone and Robaxin as below.    Pharmacotherapy Assessment   03/23/2021  12/26/2020   1  Hydrocodone-Acetamin 10-325 Mg  120.00  30  Bi Lat  [id number]  Ede (0252)  0/0  40.00 MME  Medicaid  [place]      Analgesic: Hydrocodone 10 mg 4 times daily as needed, quantity 120/month; MME equals 40   Monitoring: [place] PMP: PDMP reviewed during this encounter.       Pharmacotherapy: No side-effects or adverse reactions reported. Compliance: No problems identified. Effectiveness: Clinically acceptable.  Dewayne Shorter, RN  04/10/2021 10:33 AM  Signed Nursing Pain Medication Assessment:  Safety precautions to be maintained throughout the outpatient stay will include: orient to surroundings, keep bed in low position, maintain call bell within reach at all times, provide assistance with transfer out of bed  and ambulation.  Medication Inspection Compliance: Pill  count conducted under aseptic conditions, in front of the patient. Neither the pills nor the bottle was removed from the patient's sight at any time. Once count was completed pills were immediately returned to the patient in their original bottle.  Medication: Hydrocodone/APAP Pill/Patch Count: 50 of 120 pills remain Pill/Patch Appearance: Markings consistent with prescribed medication Bottle Appearance: Standard pharmacy container. Clearly labeled. Filled Date: 05 /20 / 2022 Last Medication intake:  Today    UDS:  Summary  Date Value Ref Range Status  04/24/2020 Note  Final    Comment:    ==================================================================== Compliance Drug Analysis, Ur ==================================================================== Test                             Result       Flag       Units  Drug Present and Declared for Prescription Verification   Amphetamine                    13465        EXPECTED   ng/mg creat    Amphetamine is available as a schedule II prescription drug.    Hydrocodone                    335          EXPECTED   ng/mg creat   Dihydrocodeine                 104          EXPECTED   ng/mg creat   Norhydrocodone                 284          EXPECTED   ng/mg creat    Sources of hydrocodone include scheduled prescription medications.    Dihydrocodeine and norhydrocodone are expected metabolites of    hydrocodone. Dihydrocodeine is also available as a scheduled    prescription medication.    Pregabalin                     PRESENT      EXPECTED   Cyclobenzaprine                PRESENT      EXPECTED   Desmethylcyclobenzaprine       PRESENT      EXPECTED    Desmethylcyclobenzaprine is an expected metabolite of    cyclobenzaprine.    Fluoxetine                     PRESENT      EXPECTED   Norfluoxetine                  PRESENT      EXPECTED    Norfluoxetine is an expected metabolite of fluoxetine.    Acetaminophen                  PRESENT       EXPECTED  Drug Absent but Declared for Prescription Verification   Alprazolam                     Not Detected UNEXPECTED ng/mg creat   Salicylate                     Not Detected UNEXPECTED  Aspirin, as indicated in the declared medication list, is not always    detected even when used as directed.  ==================================================================== Test                      Result    Flag   Units      Ref Range   Creatinine              55               mg/dL      >=20 ==================================================================== Declared Medications:  The flagging and interpretation on this report are based on the  following declared medications.  Unexpected results may arise from  inaccuracies in the declared medications.   **Note: The testing scope of this panel includes these medications:   Alprazolam (Xanax)  Amphetamine (Adderall)  Cyclobenzaprine (Flexeril)  Fluoxetine (Prozac)  Hydrocodone (Norco)  Pregabalin (Lyrica)   **Note: The testing scope of this panel does not include small to  moderate amounts of these reported medications:   Acetaminophen (Norco)  Aspirin   **Note: The testing scope of this panel does not include the  following reported medications:   Atorvastatin (Lipitor)  Cetirizine (Zyrtec)  Eye Drop  Hydrochlorothiazide (Hydrodiuril)  Levothyroxine (Synthroid)  Linaclotide (Linzess)  Lisinopril (Zestril)  Magnesium  Multivitamin  Olopatadine  Pantoprazole (Protonix)  Potassium (Klor-Con)  Ropinirole (Requip)  Vitamin C  Vitamin D2 (Drisdol)  Warfarin (Coumadin) ==================================================================== For clinical consultation, please call [phone]. ====================================================================      ROS  Constitutional: Denies any fever or chills Gastrointestinal: No reported hemesis, hematochezia, vomiting, or acute GI distress Musculoskeletal:  Left knee pain bilateral leg pain, calf pain, right greater than left Neurological: No reported episodes of acute onset apraxia, aphasia, dysarthria, agnosia, amnesia, paralysis, loss of coordination, or loss of consciousness  Medication Review  ALPRAZolam, FLUoxetine, Garlic, HYDROcodone-acetaminophen, Olopatadine HCl, Omega-3 Fatty Acids, amphetamine-dextroamphetamine, aspirin EC, atorvastatin, cetirizine, cycloSPORINE, hydrochlorothiazide, levothyroxine, linaclotide, methocarbamol, multivitamin, pantoprazole, potassium chloride, pregabalin, rOPINIRole, and warfarin  History Review  Allergy: Jane [name] is allergic to aripiprazole, seroquel [quetiapine fumarate], chlorpromazine, and gabapentin. Drug: Jane [name]  reports no history of drug use. Alcohol:  reports no history of alcohol use. Tobacco:  reports that she has never smoked. She has never used smokeless tobacco. Social: Jane [name]  reports that she has never smoked. She has never used smokeless tobacco. She reports that she does not drink alcohol and does not use drugs. Medical:  has a past medical history of ADD (attention deficit disorder), Anxiety, Back pain, Bilateral swelling of feet, Bipolar 1 disorder ([place]), Bipolar 1 disorder ([place]), Bipolar disorder ([place]), Chewing difficulty, Chronic fatigue syndrome, Chronic kidney disease, Constipation, Depression, Diabetes mellitus without complication ([place]), Dyspnea, GERD (gastroesophageal reflux disease), Headache, High cholesterol, History of blood clots, History of DVT (deep vein thrombosis), Hypertension, Hypothyroidism, Joint pain, Neuropathy, Obsessive-compulsive disorder, Peripheral vascular disease ([place]), Prediabetes, Respiratory failure requiring intubation ([place]), Restless leg syndrome, Shortness of breath, Sleep apnea, Thrombocytopenia ([place]) (09/15/2019), Trigger thumb of left hand (01/2018), and Trigger thumb of right hand. Surgical: Jane [name]  has a past surgical history that  includes Cholecystectomy; Trigger finger release (Right, 12/01/2017); Abdominal hysterectomy (06/2016); Lumbar fusion (11/21/2000); Lumbar spine surgery; Trigger finger release (Left, 01/26/2018); LOWER EXTREMITY VENOGRAPHY (N/A, 08/17/2018); Femoral-femoral Bypass Graft (Left, 11/24/2018); AV fistula placement (Left, 11/24/2018); LOWER EXTREMITY VENOGRAPHY (Bilateral, 03/09/2019); Patch angioplasty (Left, 03/30/2019); Ultrasound guidance for vascular access (Right, 03/30/2019); Femoral artery debridement (03/30/2019); Insertion of iliac stent (03/30/2019);  Groin debridement (Left, 04/27/2019); Application if wound vac (Left, 04/27/2019); LOWER EXTREMITY VENOGRAPHY (Left, 08/16/2019); PERIPHERAL VASCULAR INTERVENTION (Left, 08/16/2019); Back surgery; Colonoscopy with propofol (N/A, 11/22/2019); and Knee arthroscopy with meniscal repair (Left, 11/14/2020). Family: family history includes ADD / ADHD in her daughter; Anxiety disorder in her daughter; Bipolar disorder in her daughter, maternal aunt, and mother; Depression in her mother; Diabetes in her father; Drug abuse in her daughter and daughter; Heart disease in her brother, father, and mother; Hyperlipidemia in her brother, father, and mother; Hypertension in her brother, father, mother, and sister; Obesity in her father and mother; Sleep apnea in her father and mother; Stroke in her mother; Suicidality in her maternal aunt.  Laboratory Chemistry Profile   Renal Lab Results  Component Value Date   BUN 29 (H) 03/22/2021   CREATININE 1.36 (H) 03/22/2021   BCR 21 03/22/2021   GFRAA 53 (L) 07/18/2020   GFRNONAA 46 (L) 01/01/2021     Hepatic Lab Results  Component Value Date   AST 17 03/22/2021   ALT 19 03/22/2021   ALBUMIN 4.3 03/22/2021   ALKPHOS 115 03/22/2021     Electrolytes Lab Results  Component Value Date   NA 142 03/22/2021   K 3.8 03/22/2021   CL 104 03/22/2021   CALCIUM 9.2 03/22/2021   MG 2.2 04/04/2019   PHOS 4.0 04/04/2019     Bone Lab  Results  Component Value Date   VD25OH 50.60 01/01/2021     Inflammation (CRP: Acute Phase) (ESR: Chronic Phase) Lab Results  Component Value Date   CRP 11 (H) 02/16/2020   ESRSEDRATE 33 02/16/2020   LATICACIDVEN 1.2 01/05/2020       Note: Above Lab results reviewed.  Recent Imaging Review  DG Chest 2 View CLINICAL DATA:  55 year old female with a history of pneumonia  EXAM: CHEST - 2 VIEW  COMPARISON:  12/15/2020, 03/09/2021  FINDINGS: Cardiomediastinal silhouette unchanged in size and contour. No evidence of central vascular congestion.  Improved appearance of the lungs with near complete resolution of the previous interstitial and airspace opacities. No interlobular septal thickening. No pneumothorax or pleural effusion. Coarsened interstitial markings, with no confluent airspace disease.  No acute displaced fracture. Degenerative changes of the spine. Redemonstration of mild scoliotic curvature.  IMPRESSION: Resolving multifocal pneumonia.  Electronically Signed   By: Corrie Mckusick D.O.   On: 03/23/2021 15:26 Note: Reviewed        Physical Exam  General appearance: Well nourished, well developed, and well hydrated. In no apparent acute distress Mental status: Alert, oriented x 3 (person, place, & time)       Respiratory: No evidence of acute respiratory distress Eyes: PERLA Vitals: BP (!) 173/80   Pulse 94   Temp (!) 97 F (36.1 C)   Resp 20   Ht $R'5\' 10"'NG$  (1.778 m)   Wt 270 lb (122.5 kg)   LMP  (LMP Unknown)   SpO2 98%   BMI 38.74 kg/m  BMI: Estimated body mass index is 38.74 kg/m as calculated from the following:   Height as of this encounter: $RemoveBeforeD'5\' 10"'XIofvEJLSwOhop$  (1.778 m).   Weight as of this encounter: 270 lb (122.5 kg). Ideal: Ideal body weight: 68.5 kg (151 lb 0.2 oz) Adjusted ideal body weight: 90.1 kg (198 lb 9.7 oz)  Lumbar Spine Area Exam  Skin & Axial Inspection:Well healed scar from previous spine surgery detected Alignment:Asymmetric Functional  WGN:FAOZHYQMV ROM Stability:No instability detected Muscle Tone/Strength:Functionally intact. No obvious neuro-muscular anomalies detected. Sensory (Neurological):Dermatomal pain  pattern  Provocative Tests: Hyperextension/rotation test:(+)bilaterally for facet joint pain. Lumbar quadrant test (Kemp's test):(+)bilaterally for facet joint pain.   Gait & Posture Assessment  Ambulation:Limited Gait:Antalgic gait (limping) Posture:Difficulty standing up straight, due to pain Lower Extremity Exam    Side:Right lower extremity  Side:Left lower extremity  Stability:No instability observed  Stability:No instability observed  Skin & Extremity Inspection:Skin color, temperature, and hair growth are WNL. No peripheral edema or cyanosis. No masses, redness, swelling, asymmetry, or associated skin lesions. No contractures.  Skin & Extremity Inspection:Skin color, temperature, and hair growth are WNL. No peripheral edema or cyanosis. No masses, redness, swelling, asymmetry, or associated skin lesions. No contractures.  Functional QXI:HWTUUEKCM ROMfor hip and knee joints   Functional KLK:JZPHXTAVW ROMfor hip and knee joints, pain with weight bearing and lateral rotation   Muscle Tone/Strength:Functionally intact. No obvious neuro-muscular anomalies detected.  Muscle Tone/Strength:Functionally intact. No obvious neuro-muscular anomalies detected.  Sensory (Neurological):Neurogenic pain pattern  Sensory (Neurological):Arthropathic arthralgia for left knee severe pain with weightbearing  DTR: Patellar:deferred today Achilles:deferred today Plantar:deferred today  DTR: Patellar:deferred today Achilles:deferred today Plantar:deferred today  Palpation:No palpable anomalies  Palpation:No palpable anomalies   Assessment   Status Diagnosis  Controlled Controlled Controlled 1. Chronic pain syndrome   2. Failed  back surgical syndrome   3. Neurogenic pain   4. DDD (degenerative disc disease), lumbar   5. Pain management contract signed   6. Arthritis of left knee   7. May-Thurner syndrome       Plan of Care  Jane [name] has a current medication list which includes the following long-term medication(s): amphetamine-dextroamphetamine, amphetamine-dextroamphetamine, amphetamine-dextroamphetamine, atorvastatin, cetirizine, fluoxetine, hydrochlorothiazide, levothyroxine, linzess, pantoprazole, potassium chloride, ropinirole, and warfarin.  1. Chronic pain syndrome - HYDROcodone-acetaminophen (NORCO) 10-325 MG tablet; Take 1 tablet by mouth every 6 (six) hours as needed for severe pain. Must last 30 days.  Dispense: 120 tablet; Refill: 0 - HYDROcodone-acetaminophen (NORCO) 10-325 MG tablet; Take 1 tablet by mouth every 6 (six) hours as needed for severe pain. Must last 30 days.  Dispense: 120 tablet; Refill: 0 - HYDROcodone-acetaminophen (NORCO) 10-325 MG tablet; Take 1 tablet by mouth every 6 (six) hours as needed for severe pain. Must last 30 days.  Dispense: 120 tablet; Refill: 0 - ToxASSURE Select 13 (MW), Urine - methocarbamol (ROBAXIN) 750 MG tablet; Take 1 tablet (750 mg total) by mouth 2 (two) times daily as needed for muscle spasms.  Dispense: 60 tablet; Refill: 2  2. Failed back surgical syndrome - HYDROcodone-acetaminophen (NORCO) 10-325 MG tablet; Take 1 tablet by mouth every 6 (six) hours as needed for severe pain. Must last 30 days.  Dispense: 120 tablet; Refill: 0 - HYDROcodone-acetaminophen (NORCO) 10-325 MG tablet; Take 1 tablet by mouth every 6 (six) hours as needed for severe pain. Must last 30 days.  Dispense: 120 tablet; Refill: 0 - HYDROcodone-acetaminophen (NORCO) 10-325 MG tablet; Take 1 tablet by mouth every 6 (six) hours as needed for severe pain. Must last 30 days.  Dispense: 120 tablet; Refill: 0 - methocarbamol (ROBAXIN) 750 MG tablet; Take 1 tablet (750 mg total) by  mouth 2 (two) times daily as needed for muscle spasms.  Dispense: 60 tablet; Refill: 2  3. Neurogenic pain - HYDROcodone-acetaminophen (NORCO) 10-325 MG tablet; Take 1 tablet by mouth every 6 (six) hours as needed for severe pain. Must last 30 days.  Dispense: 120 tablet; Refill: 0 - HYDROcodone-acetaminophen (NORCO) 10-325 MG tablet; Take 1 tablet by mouth every 6 (six) hours as needed for severe  pain. Must last 30 days.  Dispense: 120 tablet; Refill: 0 - HYDROcodone-acetaminophen (NORCO) 10-325 MG tablet; Take 1 tablet by mouth every 6 (six) hours as needed for severe pain. Must last 30 days.  Dispense: 120 tablet; Refill: 0 - methocarbamol (ROBAXIN) 750 MG tablet; Take 1 tablet (750 mg total) by mouth 2 (two) times daily as needed for muscle spasms.  Dispense: 60 tablet; Refill: 2  4. DDD (degenerative disc disease), lumbar  5. Pain management contract signed  6. Arthritis of left knee  7. May-Thurner syndrome    Pharmacotherapy (Medications Ordered): Meds ordered this encounter  Medications  . HYDROcodone-acetaminophen (NORCO) 10-325 MG tablet    Sig: Take 1 tablet by mouth every 6 (six) hours as needed for severe pain. Must last 30 days.    Dispense:  120 tablet    Refill:  0    Chronic Pain. (STOP Act - Not applicable). Fill one day early if closed on scheduled refill date.  Marland Kitchen HYDROcodone-acetaminophen (NORCO) 10-325 MG tablet    Sig: Take 1 tablet by mouth every 6 (six) hours as needed for severe pain. Must last 30 days.    Dispense:  120 tablet    Refill:  0    Chronic Pain. (STOP Act - Not applicable). Fill one day early if closed on scheduled refill date.  Marland Kitchen HYDROcodone-acetaminophen (NORCO) 10-325 MG tablet    Sig: Take 1 tablet by mouth every 6 (six) hours as needed for severe pain. Must last 30 days.    Dispense:  120 tablet    Refill:  0    Chronic Pain. (STOP Act - Not applicable). Fill one day early if closed on scheduled refill date.  . methocarbamol (ROBAXIN) 750  MG tablet    Sig: Take 1 tablet (750 mg total) by mouth 2 (two) times daily as needed for muscle spasms.    Dispense:  60 tablet    Refill:  2    Do not place this medication, or any other prescription from our practice, on "Automatic Refill". Patient may have prescription filled one day early if pharmacy is closed on scheduled refill date.   Follow-up plan:   Return in about 3 months (around 07/19/2021) for Medication Management, in person.   Recent Visits No visits were found meeting these conditions. Showing recent visits within past 90 days and meeting all other requirements Today's Visits Date Type Provider Dept  04/10/21 Office Visit Gillis Santa, MD [facility]  Showing today's visits and meeting all other requirements Future Appointments No visits were found meeting these conditions. Showing future appointments within next 90 days and meeting all other requirements  I discussed the assessment and treatment plan with the patient. The patient was provided an opportunity to ask questions and all were answered. The patient agreed with the plan and demonstrated an understanding of the instructions.  Patient advised to call back or seek an in-person evaluation if the symptoms or condition worsens.  Duration of encounter: 12minutes.  Note by: Gillis Santa, MD Date: 04/10/2021; Time: 10:52 AM

## 2021-04-10 NOTE — Progress Notes (Signed)
Nursing Pain Medication Assessment:  Safety precautions to be maintained throughout the outpatient stay will include: orient to surroundings, keep bed in low position, maintain call bell within reach at all times, provide assistance with transfer out of bed and ambulation.  Medication Inspection Compliance: Pill count conducted under aseptic conditions, in front of the patient. Neither the pills nor the bottle was removed from the patient's sight at any time. Once count was completed pills were immediately returned to the patient in their original bottle.  Medication: Hydrocodone/APAP Pill/Patch Count: 50 of 120 pills remain Pill/Patch Appearance: Markings consistent with prescribed medication Bottle Appearance: Standard pharmacy container. Clearly labeled. Filled Date: 05 /20 / 2022 Last Medication intake:  Today

## 2021-04-11 ENCOUNTER — Other Ambulatory Visit (HOSPITAL_COMMUNITY): Payer: Self-pay | Admitting: *Deleted

## 2021-04-11 ENCOUNTER — Telehealth: Payer: Self-pay | Admitting: Student in an Organized Health Care Education/Training Program

## 2021-04-11 ENCOUNTER — Other Ambulatory Visit: Payer: Self-pay | Admitting: Family Medicine

## 2021-04-11 DIAGNOSIS — Z862 Personal history of diseases of the blood and blood-forming organs and certain disorders involving the immune mechanism: Secondary | ICD-10-CM

## 2021-04-11 DIAGNOSIS — D509 Iron deficiency anemia, unspecified: Secondary | ICD-10-CM

## 2021-04-11 DIAGNOSIS — E119 Type 2 diabetes mellitus without complications: Secondary | ICD-10-CM | POA: Diagnosis not present

## 2021-04-11 NOTE — Telephone Encounter (Signed)
I called Dr. Claretha Cooper office, they said she told them she just needed an ultrasound. They told her that an order was needed. I told them that she should be evaluated by vascular to determine if ultrasound is needed. I informed the office staff that I would tell patient to call them to schedule appt. Patient called, she will call Dr. Claretha Cooper office.

## 2021-04-11 NOTE — Telephone Encounter (Signed)
Patient called stating Dr. Holley Raring asked [facility] to [facility]? When she called [facility] physician they told her they have to have a referral and order faxed over before they would do this. Please advise patient.

## 2021-04-11 NOTE — Telephone Encounter (Signed)
She called Dr. Claretha Cooper office to request an Jane [name], they told her they need a referral in order to do that, even though she is already a patient there.

## 2021-04-12 ENCOUNTER — Emergency Department (HOSPITAL_COMMUNITY)
Admission: EM | Admit: 2021-04-12 | Discharge: 2021-04-12 | Disposition: A | Discharge status: Home / Self Care | Payer: Medicaid Other | Attending: Emergency Medicine | Admitting: Emergency Medicine

## 2021-04-12 ENCOUNTER — Emergency Department (HOSPITAL_COMMUNITY): Payer: Medicaid Other

## 2021-04-12 ENCOUNTER — Inpatient Hospital Stay (HOSPITAL_COMMUNITY): Payer: Medicaid Other | Attending: Hematology

## 2021-04-12 ENCOUNTER — Other Ambulatory Visit: Payer: Self-pay

## 2021-04-12 ENCOUNTER — Encounter (HOSPITAL_COMMUNITY): Payer: Self-pay | Admitting: Emergency Medicine

## 2021-04-12 DIAGNOSIS — Z86718 Personal history of other venous thrombosis and embolism: Secondary | ICD-10-CM | POA: Insufficient documentation

## 2021-04-12 DIAGNOSIS — R5382 Chronic fatigue, unspecified: Secondary | ICD-10-CM | POA: Diagnosis not present

## 2021-04-12 DIAGNOSIS — G8929 Other chronic pain: Secondary | ICD-10-CM | POA: Insufficient documentation

## 2021-04-12 DIAGNOSIS — M79661 Pain in right lower leg: Secondary | ICD-10-CM | POA: Diagnosis not present

## 2021-04-12 DIAGNOSIS — Z79899 Other long term (current) drug therapy: Secondary | ICD-10-CM | POA: Insufficient documentation

## 2021-04-12 DIAGNOSIS — E1122 Type 2 diabetes mellitus with diabetic chronic kidney disease: Secondary | ICD-10-CM | POA: Diagnosis not present

## 2021-04-12 DIAGNOSIS — E1140 Type 2 diabetes mellitus with diabetic neuropathy, unspecified: Secondary | ICD-10-CM | POA: Diagnosis not present

## 2021-04-12 DIAGNOSIS — D509 Iron deficiency anemia, unspecified: Secondary | ICD-10-CM | POA: Insufficient documentation

## 2021-04-12 DIAGNOSIS — I82592 Chronic embolism and thrombosis of other specified deep vein of left lower extremity: Secondary | ICD-10-CM | POA: Insufficient documentation

## 2021-04-12 DIAGNOSIS — Z7901 Long term (current) use of anticoagulants: Secondary | ICD-10-CM | POA: Insufficient documentation

## 2021-04-12 DIAGNOSIS — I824Y9 Acute embolism and thrombosis of unspecified deep veins of unspecified proximal lower extremity: Secondary | ICD-10-CM

## 2021-04-12 DIAGNOSIS — N183 Chronic kidney disease, stage 3 unspecified: Secondary | ICD-10-CM | POA: Diagnosis not present

## 2021-04-12 DIAGNOSIS — E119 Type 2 diabetes mellitus without complications: Secondary | ICD-10-CM | POA: Diagnosis not present

## 2021-04-12 DIAGNOSIS — I82412 Acute embolism and thrombosis of left femoral vein: Secondary | ICD-10-CM | POA: Insufficient documentation

## 2021-04-12 DIAGNOSIS — R791 Abnormal coagulation profile: Secondary | ICD-10-CM

## 2021-04-12 DIAGNOSIS — I129 Hypertensive chronic kidney disease with stage 1 through stage 4 chronic kidney disease, or unspecified chronic kidney disease: Secondary | ICD-10-CM | POA: Insufficient documentation

## 2021-04-12 DIAGNOSIS — Z7982 Long term (current) use of aspirin: Secondary | ICD-10-CM | POA: Diagnosis not present

## 2021-04-12 DIAGNOSIS — E039 Hypothyroidism, unspecified: Secondary | ICD-10-CM | POA: Insufficient documentation

## 2021-04-12 DIAGNOSIS — M79662 Pain in left lower leg: Secondary | ICD-10-CM | POA: Diagnosis not present

## 2021-04-12 DIAGNOSIS — I82402 Acute embolism and thrombosis of unspecified deep veins of left lower extremity: Secondary | ICD-10-CM | POA: Diagnosis not present

## 2021-04-12 DIAGNOSIS — R600 Localized edema: Secondary | ICD-10-CM | POA: Diagnosis not present

## 2021-04-12 DIAGNOSIS — Z862 Personal history of diseases of the blood and blood-forming organs and certain disorders involving the immune mechanism: Secondary | ICD-10-CM

## 2021-04-12 DIAGNOSIS — J90 Pleural effusion, not elsewhere classified: Secondary | ICD-10-CM | POA: Diagnosis not present

## 2021-04-12 LAB — CBC WITH DIFFERENTIAL/PLATELET
Abs Immature Granulocytes: 0.04 10*3/uL (ref 0.00–0.07)
Abs Immature Granulocytes: 0.02 10*3/uL (ref 0.00–0.07)
Basophils Absolute: 0 10*3/uL (ref 0.0–0.1)
Basophils Absolute: 0 10*3/uL (ref 0.0–0.1)
Basophils Relative: 0 %
Basophils Relative: 0 %
Eosinophils Absolute: 0.1 10*3/uL (ref 0.0–0.5)
Eosinophils Absolute: 0.1 10*3/uL (ref 0.0–0.5)
Eosinophils Relative: 2 %
Eosinophils Relative: 2 %
HCT: 42.3 % (ref 36.0–46.0)
HCT: 41.1 % (ref 36.0–46.0)
Hemoglobin: 14 g/dL (ref 12.0–15.0)
Hemoglobin: 13.6 g/dL (ref 12.0–15.0)
Immature Granulocytes: 1 %
Immature Granulocytes: 0 %
Lymphocytes Relative: 19 %
Lymphocytes Relative: 23 %
Lymphs Abs: 1.5 10*3/uL (ref 0.7–4.0)
Lymphs Abs: 1.6 10*3/uL (ref 0.7–4.0)
MCH: 30.9 pg (ref 26.0–34.0)
MCH: 30.9 pg (ref 26.0–34.0)
MCHC: 33.1 g/dL (ref 30.0–36.0)
MCHC: 33.1 g/dL (ref 30.0–36.0)
MCV: 93.4 fL (ref 80.0–100.0)
MCV: 93.4 fL (ref 80.0–100.0)
Monocytes Absolute: 0.7 10*3/uL (ref 0.1–1.0)
Monocytes Absolute: 0.6 10*3/uL (ref 0.1–1.0)
Monocytes Relative: 9 %
Monocytes Relative: 8 %
Neutro Abs: 5.4 10*3/uL (ref 1.7–7.7)
Neutro Abs: 4.9 10*3/uL (ref 1.7–7.7)
Neutrophils Relative %: 69 %
Neutrophils Relative %: 67 %
Platelets: 148 10*3/uL — ABNORMAL LOW (ref 150–400)
Platelets: 146 10*3/uL — ABNORMAL LOW (ref 150–400)
RBC: 4.53 MIL/uL (ref 3.87–5.11)
RBC: 4.4 MIL/uL (ref 3.87–5.11)
RDW: 13.6 % (ref 11.5–15.5)
RDW: 13.4 % (ref 11.5–15.5)
WBC: 7.8 10*3/uL (ref 4.0–10.5)
WBC: 7.2 10*3/uL (ref 4.0–10.5)
nRBC: 0 % (ref 0.0–0.2)
nRBC: 0 % (ref 0.0–0.2)

## 2021-04-12 LAB — COMPREHENSIVE METABOLIC PANEL
ALT: 20 U/L (ref 0–44)
AST: 17 U/L (ref 15–41)
Albumin: 3.8 g/dL (ref 3.5–5.0)
Alkaline Phosphatase: 95 U/L (ref 38–126)
Anion gap: 7 (ref 5–15)
BUN: 34 mg/dL — ABNORMAL HIGH (ref 6–20)
CO2: 32 mmol/L (ref 22–32)
Calcium: 9.5 mg/dL (ref 8.9–10.3)
Chloride: 102 mmol/L (ref 98–111)
Creatinine, Ser: 1.26 mg/dL — ABNORMAL HIGH (ref 0.44–1.00)
GFR, Estimated: 51 mL/min — ABNORMAL LOW (ref >60)
Glucose, Bld: 103 mg/dL — ABNORMAL HIGH (ref 70–99)
Potassium: 3.9 mmol/L (ref 3.5–5.1)
Sodium: 141 mmol/L (ref 135–145)
Total Bilirubin: 0.6 mg/dL (ref 0.3–1.2)
Total Protein: 7.4 g/dL (ref 6.5–8.1)

## 2021-04-12 LAB — BASIC METABOLIC PANEL
Anion gap: 7 (ref 5–15)
BUN: 33 mg/dL — ABNORMAL HIGH (ref 6–20)
CO2: 32 mmol/L (ref 22–32)
Calcium: 9.5 mg/dL (ref 8.9–10.3)
Chloride: 101 mmol/L (ref 98–111)
Creatinine, Ser: 1.23 mg/dL — ABNORMAL HIGH (ref 0.44–1.00)
GFR, Estimated: 52 mL/min — ABNORMAL LOW (ref >60)
Glucose, Bld: 106 mg/dL — ABNORMAL HIGH (ref 70–99)
Potassium: 3.8 mmol/L (ref 3.5–5.1)
Sodium: 140 mmol/L (ref 135–145)

## 2021-04-12 LAB — FERRITIN: Ferritin: 82 ng/mL (ref 11–307)

## 2021-04-12 LAB — IRON AND TIBC
Iron: 49 ug/dL (ref 28–170)
Saturation Ratios: 16 % (ref 10.4–31.8)
TIBC: 304 ug/dL (ref 250–450)
UIBC: 255 ug/dL

## 2021-04-12 LAB — LACTATE DEHYDROGENASE: LDH: 160 U/L (ref 98–192)

## 2021-04-12 LAB — PROTIME-INR
INR: 1.6 — ABNORMAL HIGH (ref 0.8–1.2)
Prothrombin Time: 18.7 seconds — ABNORMAL HIGH (ref 11.4–15.2)

## 2021-04-12 MED ORDER — IOHEXOL 350 MG/ML SOLN
50.0000 mL | Freq: Once | INTRAVENOUS | Status: AC | PRN
  Administered 2021-04-12: 25 mL via INTRAVENOUS

## 2021-04-12 MED ORDER — IOHEXOL 350 MG/ML SOLN
100.0000 mL | Freq: Once | INTRAVENOUS | Status: AC | PRN
  Administered 2021-04-12: 100 mL via INTRAVENOUS

## 2021-04-12 MED ORDER — HYDROMORPHONE HCL 1 MG/ML IJ SOLN
1.0000 mg | Freq: Once | INTRAMUSCULAR | Status: AC
  Administered 2021-04-12: 1 mg via INTRAMUSCULAR
  Filled 2021-04-12: qty 1

## 2021-04-12 NOTE — ED Triage Notes (Signed)
Bilateral leg pain that began on Saturday mornings and was seen at [facility] for the same.   Pt has a history of blood clots.

## 2021-04-12 NOTE — Discharge Instructions (Addendum)
Take an extra dose of your warfarin or Coumadin this evening.  You will need to follow-up with your doctor for a recheck of your blood level of the Coumadin within the next week.  I spoke with the vascular surgeon, Dr. Carlis Abbott who is one of your doctors partners.  They recommended not changing anything this evening, the stent looks wide open, there is no real changes in the CT scan.  This is good news however return to the ER for severe worsening swelling pain difficulty breathing or chest pain

## 2021-04-12 NOTE — ED Provider Notes (Signed)
And change of shift, care was signed out to me pending results of CT scan to look at patient's femoral to femoral bypass stent.  I discussed the case with Dr. Carlis Abbott of the vascular surgery service who has reviewed all of the images and agrees that this patient can be discharged home to follow-up in the outpatient setting.  May need an extra dose of anticoagulant to make INR more therapeutic but otherwise no other changes indicated.  The patient will be informed of this.  Stable for d/c.   Noemi Chapel, MD 04/12/21 2011

## 2021-04-12 NOTE — ED Provider Notes (Addendum)
[facility] EMERGENCY DEPARTMENT Provider Note   CSN: [id number] Arrival date & time: 04/12/21  1018     History Chief Complaint  Patient presents with   Leg Pain    Maryhelen Lindler is a 54 y.o. female.  HPI  55 year old female with past medical history of previous DVTs, multiple lower extremity stents, anticoagulated on Coumadin, HTN, HLD presents emergency department with bilateral lower extremity pain.  Patient was seen at an emergency department the other day for the same complaint.  Her blood pressure was reported as normal and she was discharged.  However she states the pain and achiness is getting worse.  She denies any redness, discoloration, fever.  No injury to the legs.  Denies any chest pain or shortness of breath.  Past Medical History:  Diagnosis Date   ADD (attention deficit disorder)    Anxiety    Back pain    Bilateral swelling of feet    Bipolar 1 disorder (HCC)    Bipolar 1 disorder (HCC)    Bipolar disorder (HCC)    Chewing difficulty    Chronic fatigue syndrome    Chronic kidney disease    Stage 3 kidney disease;dx by Dr. Sinda Du.    Constipation    Depression    Diabetes mellitus without complication (HCC)    diet controlled   Dyspnea    with exertion   GERD (gastroesophageal reflux disease)    Headache    migraines   High cholesterol    History of blood clots    History of DVT (deep vein thrombosis)    left leg   Hypertension    states under control with meds., has been on med. x 2 years   Hypothyroidism    Joint pain    Neuropathy    Obsessive-compulsive disorder    Peripheral vascular disease ([place])    Prediabetes    Respiratory failure requiring intubation (HCC)    Restless leg syndrome    Shortness of breath    Sleep apnea    Thrombocytopenia ([place]) 09/15/2019   Trigger thumb of left hand 01/2018   Trigger thumb of right hand     Patient Active Problem List   Diagnosis Date Noted   Nocturnal sleep-related eating disorder  01/01/2021   Vitamin D deficiency 01/01/2021   Arthritis of left knee 12/26/2020   S/P left knee arthroscopy 11/14/20  11/21/2020   Old complex tear of medial meniscus of left knee    Chronic pain syndrome 04/24/2020   Pharmacologic therapy 04/24/2020   Disorder of skeletal system 04/24/2020   Problems influencing health status 04/24/2020   History of thrombocytopenia 04/24/2020   Abnormal MRI, cervical spine (01/08/2017) 04/24/2020   Abnormal MRI, lumbar spine (05/11/2014) 04/24/2020   DDD (degenerative disc disease), cervical 04/24/2020   DDD (degenerative disc disease), lumbar 04/24/2020   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   Diabetic peripheral neuropathy ([place]) 04/24/2020   Neurogenic pain 04/24/2020   Chronic musculoskeletal pain 04/24/2020   Other proteinuria 03/30/2020   Myalgia 03/15/2020   Bilateral hand swelling 02/16/2020   Thrush 01/20/2020   Class 2 severe obesity due to excess calories with serious comorbidity and body mass index (BMI) of 38.0 to 38.9 in adult [facility]) 10/25/2019   History of back surgery 10/25/2019   Peripheral neuropathy 10/25/2019   Restless leg syndrome 10/25/2019   Chronic low back pain (Bilateral) w/ sciatica (Bilateral) 10/25/2019   Essential hypertension 10/25/2019   Type 2 diabetes mellitus with stage 3 chronic  kidney disease, without long-term current use of insulin ([place]) 10/25/2019   Chronic anticoagulation (Coumadin) 10/25/2019   Hyperlipidemia associated with type 2 diabetes mellitus ([place]) 10/25/2019   CKD stage 3 due to type 2 diabetes mellitus ([place]) 10/25/2019   GERD (gastroesophageal reflux disease) 10/25/2019   Allergic rhinitis 10/25/2019   Chronic constipation 10/25/2019   Hypothyroidism    Normocytic anemia 09/15/2019   Wound of left groin 04/27/2019   Iliac vein stenosis, 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   Chronic venous insufficiency 11/21/2017   Displacement of lumbar intervertebral disc without  myelopathy 07/14/2017   Radicular low back pain 05/25/2014   Depression, major, recurrent ([place]) 06/17/2013   OCD (obsessive compulsive disorder) 11/20/2012   Insomnia due to mental disorder 09/25/2012   Bipolar 1 disorder ([place]) 09/25/2012   History of DVT (deep vein thrombosis) 08/18/2012    Past Surgical History:  Procedure Laterality Date   ABDOMINAL HYSTERECTOMY  06/2016   complete   APPLICATION OF WOUND VAC Left 04/27/2019   Procedure: APPLICATION OF WOUND VAC LEFT GROIN;  Surgeon: Waynetta Sandy, MD;  Location: [place];  Service: Vascular;  Laterality: Left;   AV FISTULA PLACEMENT Left 11/24/2018   Procedure: ARTERIOVENOUS (AV) FISTULA CREATION LEFT SFA TO LEFT FEMORAL VEIN;  Surgeon: Waynetta Sandy, MD;  Location: [place];  Service: Vascular;  Laterality: Left;   BACK SURGERY     CHOLECYSTECTOMY     COLONOSCOPY WITH PROPOFOL N/A 11/22/2019   Procedure: COLONOSCOPY WITH PROPOFOL;  Surgeon: [name], MD;  Location: [facility];  Service: Gastroenterology;  Laterality: N/A;   FEMORAL ARTERY EXPLORATION  03/30/2019   Procedure: Left Common Femoral Artery and Vein Exploration;  Surgeon: Waynetta Sandy, MD;  Location: [place];  Service: Vascular;;   FEMORAL-FEMORAL BYPASS GRAFT Left 11/24/2018   Procedure: BYPASS GRAFT FEMORAL-FEMORAL VENOUS LEFT TO RIGHT PALMA PROCEDURE USING CRYOVEIN;  Surgeon: Waynetta Sandy, MD;  Location: [place];  Service: Vascular;  Laterality: Left;   GROIN DEBRIDEMENT Left 04/27/2019   Procedure: GROIN DEBRIDEMENT;  Surgeon: Waynetta Sandy, MD;  Location: [place];  Service: Vascular;  Laterality: Left;   INSERTION OF ILIAC STENT  03/30/2019   Procedure: Stent of left common, external iliac veins and left common femoral vein;  Surgeon: Waynetta Sandy, MD;  Location: [place];  Service: Vascular;;   KNEE ARTHROSCOPY WITH MENISCAL REPAIR Left 11/14/2020   Procedure: LEFT KNEE ARTHROSCOPY WITH PARTIAL MEDIAL  MENISCECTOMY;  Surgeon: Carole Civil, MD;  Location: [facility];  Service: Orthopedics;  Laterality: Left;   LOWER EXTREMITY VENOGRAPHY N/A 08/17/2018   Procedure: LOWER EXTREMITY VENOGRAPHY - Central Venogram;  Surgeon: Waynetta Sandy, MD;  Location: [place] [facility];  Service: Cardiovascular;  Laterality: N/A;   LOWER EXTREMITY VENOGRAPHY Bilateral 03/09/2019   Procedure: LOWER EXTREMITY VENOGRAPHY;  Surgeon: Waynetta Sandy, MD;  Location: [place] [facility];  Service: Cardiovascular;  Laterality: Bilateral;   LOWER EXTREMITY VENOGRAPHY Left 08/16/2019   Procedure: LOWER EXTREMITY VENOGRAPHY;  Surgeon: Waynetta Sandy, MD;  Location: [place] [facility];  Service: Cardiovascular;  Laterality: Left;   LUMBAR FUSION  11/21/2000   L5-S1   LUMBAR SPINE SURGERY     x 2 others   PATCH ANGIOPLASTY Left 03/30/2019   Procedure: Patch Angioplasty of the Left Common Femoral Vein using Venosure Biologic patch;  Surgeon: Waynetta Sandy, MD;  Location: [place];  Service: Vascular;  Laterality: Left;   PERIPHERAL VASCULAR INTERVENTION Left  08/16/2019   Procedure: PERIPHERAL VASCULAR INTERVENTION;  Surgeon: Waynetta Sandy, MD;  Location: [place] [facility];  Service: Cardiovascular;  Laterality: Left;  common femoral/femoral vein stent   TRIGGER FINGER RELEASE Right 12/01/2017   Procedure: RELEASE TRIGGER FINGER/A-1 PULLEY RIGHT THUMB;  Surgeon: Leanora Cover, MD;  Location: [place];  Service: Orthopedics;  Laterality: Right;   TRIGGER FINGER RELEASE Left 01/26/2018   Procedure: LEFT TRIGGER THUMB RELEASE;  Surgeon: Leanora Cover, MD;  Location: [place];  Service: Orthopedics;  Laterality: Left;   ULTRASOUND GUIDANCE FOR VASCULAR ACCESS Right 03/30/2019   Procedure: Ultrasound-guided cannulation right internal jugular vein;  Surgeon: Waynetta Sandy, MD;  Location: [place];  Service: Vascular;  Laterality: Right;      OB History     Gravida  3   Para      Term      Preterm      AB      Living  3      SAB      IAB      Ectopic      Multiple      Live Births              Family History  Problem Relation Age of Onset   Heart disease Mother    Hyperlipidemia Mother    Hypertension Mother    Bipolar disorder Mother    Stroke Mother    Depression Mother    Sleep apnea Mother    Obesity Mother    Diabetes Father    Heart disease Father    Hyperlipidemia Father    Hypertension Father    Sleep apnea Father    Obesity Father    Drug abuse Daughter    ADD / ADHD Daughter    Drug abuse Daughter    Anxiety disorder Daughter    Bipolar disorder Daughter    Hypertension Sister    Hypertension Brother    Hyperlipidemia Brother    Heart disease Brother    Bipolar disorder Maternal Aunt    Suicidality Maternal Aunt     Social History   Tobacco Use   Smoking status: Never   Smokeless tobacco: Never  Vaping Use   Vaping Use: Never used  Substance Use Topics   Alcohol use: No   Drug use: No    Home Medications Prior to Admission medications   Medication Sig Start Date End Date Taking? Authorizing Provider  ALPRAZolam Duanne Moron) 1 MG tablet Take 1 tablet (1 mg total) by mouth 2 (two) times daily as needed for anxiety. 02/26/21 02/26/22 Yes Cloria Spring, MD  amphetamine-dextroamphetamine (ADDERALL) 30 MG tablet Take 1 tablet by mouth 2 (two) times daily. 02/26/21 02/26/22 Yes Cloria Spring, MD  aspirin EC 81 MG tablet Take 81 mg by mouth daily.   Yes [provider]  atorvastatin (LIPITOR) 20 MG tablet TAKE 1 TABLET BY MOUTH DAILY Patient taking differently: Take 20 mg by mouth daily. 04/01/21  Yes Bacigalupo, Dionne Bucy, MD  cetirizine (ZYRTEC) 10 MG tablet TAKE 1 TABLET BY MOUTH DAILY Patient taking differently: Take 10 mg by mouth daily. 12/24/20  Yes Bacigalupo, Dionne Bucy, MD  cycloSPORINE (RESTASIS) 0.05 % ophthalmic emulsion Place 1 drop into both eyes 2  (two) times daily.   Yes [provider]  FLUoxetine (PROZAC) 40 MG capsule TAKE 1 TABLET BY MOUTH DAILY Patient taking differently: Take 40 mg by mouth daily. TAKE 1 TABLET BY MOUTH  DAILY 02/26/21  Yes Cloria Spring, MD  GARLIC PO Take 6,295 mg by mouth daily.   Yes [provider]  hydrochlorothiazide (HYDRODIURIL) 25 MG tablet TAKE 1 TABLET BY MOUTH DAILY Patient taking differently: Take 25 mg by mouth daily. 04/01/21  Yes Bacigalupo, Dionne Bucy, MD  HYDROcodone-acetaminophen (NORCO) 10-325 MG tablet Take 1 tablet by mouth every 6 (six) hours as needed for severe pain. Must last 30 days. 04/22/21 05/22/21 Yes Gillis Santa, MD  levothyroxine (SYNTHROID) 125 MCG tablet Take 125 mcg by mouth daily before breakfast.   Yes [provider]  LINZESS 145 MCG CAPS capsule TAKE ONE CAPSULE BY MOUTH DAILY Patient taking differently: Take 145 mcg by mouth daily as needed. 03/01/21  Yes Bacigalupo, Dionne Bucy, MD  methocarbamol (ROBAXIN) 750 MG tablet Take 1 tablet (750 mg total) by mouth 2 (two) times daily as needed for muscle spasms. 04/10/21  Yes Gillis Santa, MD  Multiple Vitamin (MULTIVITAMIN) capsule Take 1 capsule by mouth daily.   Yes [provider]  Olopatadine HCl 0.2 % SOLN Place 1 drop into both eyes daily. 12/31/19  Yes [provider]  Omega-3 Fatty Acids (OMEGA-3 FISH OIL PO) Take 600 mg by mouth daily.   Yes [provider]  pantoprazole (PROTONIX) 40 MG tablet TAKE 1 TABLET BY MOUTH DAILY Patient taking differently: Take 40 mg by mouth daily. 01/31/21  Yes Bacigalupo, Dionne Bucy, MD  pregabalin (LYRICA) 100 MG capsule Take 100 mg by mouth 4 (four) times daily. 12/13/19  Yes [provider]  rOPINIRole (REQUIP) 2 MG tablet TAKE 1 TABLET BY MOUTH TWICE DAILY Patient taking differently: Take 2 mg by mouth 2 (two) times daily. 03/22/21  Yes Bacigalupo, Dionne Bucy, MD  warfarin (COUMADIN) 5 MG tablet TAKE 1 AND 1/2 TABLETS BY MOUTH ON TUEDAYS AND  THURSDAYS, THEN TAKE 1 TABLET DAILY ON ALL OTHER DAYS Patient taking differently: See admin instructions. Take 2.5 mg on Mon and Wednesdays and 5 tablet on the rest of the days 01/31/21  Yes Bacigalupo, Dionne Bucy, MD  amphetamine-dextroamphetamine (ADDERALL) 30 MG tablet Take 1 tablet by mouth 2 (two) times daily. Patient not taking: Reported on 04/12/2021 02/26/21 02/26/22  Cloria Spring, MD  amphetamine-dextroamphetamine (ADDERALL) 30 MG tablet Take 1 tablet by mouth 2 (two) times daily. Patient not taking: Reported on 04/12/2021 02/26/21 02/26/22  Cloria Spring, MD  HYDROcodone-acetaminophen [facility]) 10-325 MG tablet Take 1 tablet by mouth every 6 (six) hours as needed for severe pain. Must last 30 days. 05/22/21 06/21/21  Gillis Santa, MD  HYDROcodone-acetaminophen (NORCO) 10-325 MG tablet Take 1 tablet by mouth every 6 (six) hours as needed for severe pain. Must last 30 days. Patient not taking: Reported on 04/12/2021 06/21/21 07/21/21  Gillis Santa, MD  potassium chloride (KLOR-CON) 10 MEQ tablet TAKE 1 TABLET BY MOUTH THREE TIMES DAILY 04/11/21   [name], MD    Allergies    Aripiprazole, Seroquel [quetiapine fumarate], Chlorpromazine, and Gabapentin  Review of Systems   Review of Systems  Constitutional:  Negative for chills and fever.  HENT:  Negative for congestion.   Eyes:  Negative for visual disturbance.  Respiratory:  Negative for shortness of breath.   Cardiovascular:  Negative for chest pain.  Gastrointestinal:  Negative for abdominal pain, diarrhea and vomiting.  Genitourinary:  Negative for dysuria.  Musculoskeletal:        + Bilateral lower extremity pain/achiness  Skin:  Negative for rash.  Neurological:  Negative for headaches.  Physical Exam Updated Vital Signs BP 140/84   Pulse (!) 50   Temp 98 F (36.7 C) (Oral)   Resp 18   Ht 5\' 10"  (1.778 m)   Wt 122.5 kg   LMP  (LMP Unknown)   SpO2 94%   BMI 38.74 kg/m   Physical Exam Vitals and nursing note  reviewed.  Constitutional:      Appearance: Normal appearance.  HENT:     Head: Normocephalic.     Mouth/Throat:     Mouth: Mucous membranes are moist.  Cardiovascular:     Rate and Rhythm: Normal rate.  Pulmonary:     Effort: Pulmonary effort is normal. No respiratory distress.  Abdominal:     Palpations: Abdomen is soft.     Tenderness: There is no abdominal tenderness.  Musculoskeletal:     Comments: Legs do not look objectively over swollen, they are diffusely tender to palpation, palpable DP pulses, stronger on the right but the patient states that that is baseline for her, no discoloration of the feet, normal capillary refill  Skin:    General: Skin is warm.  Neurological:     Mental Status: She is alert and oriented to person, place, and time. Mental status is at baseline.  Psychiatric:        Mood and Affect: Mood normal.    ED Results / Procedures / Treatments   Labs (all labs ordered are listed, but only abnormal results are displayed) Labs Reviewed  CBC WITH DIFFERENTIAL/PLATELET - Abnormal; Notable for the following components:      Result Value   Platelets 146 (*)    All other components within normal limits  BASIC METABOLIC PANEL - Abnormal; Notable for the following components:   Glucose, Bld 106 (*)    BUN 33 (*)    Creatinine, Ser 1.23 (*)    GFR, Estimated 52 (*)    All other components within normal limits  PROTIME-INR - Abnormal; Notable for the following components:   Prothrombin Time 18.7 (*)    INR 1.6 (*)    All other components within normal limits    EKG None  Radiology US Venous Img Lower  Left (DVT Study)  Result Date: 04/12/2021 CLINICAL DATA:  Lower extremity pain and edema EXAM: LEFT LOWER EXTREMITY VENOUS DUPLEX ULTRASOUND TECHNIQUE: Gray-scale sonography with graded compression, as well as color Doppler and duplex ultrasound were performed to evaluate the left lower extremity deep venous system from the level of the common femoral vein  and including the common femoral, femoral, profunda femoral, popliteal and calf veins including the posterior tibial, peroneal and gastrocnemius veins when visible. The superficial great saphenous vein was also interrogated. Spectral Doppler was utilized to evaluate flow at rest and with distal augmentation maneuvers in the common femoral, femoral and popliteal veins. COMPARISON:  None. FINDINGS: Contralateral Common Femoral Vein: Respiratory phasicity is normal and symmetric with the symptomatic side. No evidence of thrombus. Normal compressibility. Common Femoral Vein: There is obstructing thrombus in the left common femoral vein with echogenicity appears primarily in previous. There is loss of compression augmentation in this area. Saphenofemoral Junction: Scarring at the level of the saphenofemoral junction makes assessment of this area difficult. There may be thrombus at the saphenofemoral junction. Profunda Femoral Vein: No evidence of thrombus. Normal compressibility and flow on color Doppler imaging. Femoral Vein: No evidence of thrombus. Normal compressibility, respiratory phasicity and response to augmentation. Popliteal Vein: No evidence of thrombus. Normal compressibility, respiratory phasicity and response to  augmentation. Calf Veins: No evidence of thrombus. Normal compressibility and flow on color Doppler imaging. Superficial Great Saphenous Vein: Saphenous vein bypass graft, patent. Venous Reflux:  None. Other Findings:  None IMPRESSION: Obstruction at the level of the left common femoral vein and saphenofemoral junction scarring at the saphenofemoral junction. The appearance is thrombus in the common femoral veins suggests that there is chronic thrombus in this area, although superimposed acute deep venous thrombosis in the left common femoral vein cannot be excluded and must be a potential concern. No other areas of deep venous thrombosis evident. Right common femoral vein patent. Status post size  stain bypass graft on the left. These results were called by telephone at the time of interpretation on 04/12/2021 at 12:58 pm to provider [facility] , who verbally acknowledged these results. Electronically Signed   By: Lowella Grip III M.D.   On: 04/12/2021 12:58    Procedures Procedures   Medications Ordered in ED Medications  HYDROmorphone (DILAUDID) injection 1 mg (1 mg Intramuscular Given 04/12/21 1153)    ED Course  I have reviewed the triage vital signs and the nursing notes.  Pertinent labs & imaging results that were available during my care of the patient were reviewed by me and considered in my medical decision making (see chart for details).    MDM Rules/Calculators/A&P                          55 year old female presents emergency department with lower extremity pain and achiness.  She is anticoagulant on Coumadin.  She states last month her INR was therapeutic.  She has been taking her medications as directed.  Ultrasound shows blockage in the left common femoral vein, the ultrasound on the right lower extremity is currently in process.  Blood work came back with a subtherapeutic INR 1.6.  Due to the multiple procedures/stents that the patient has had will consult with vascular for plan going forward.  Vascular consult has been placed, they will call back with recommendations.  Vascular recommends a CT venogram of the abdomen pelvis to evaluate for this patient stents and femoral-femoral vein bypass.  Pending these results and discussion with vascular will be the patient's disposition.  Patient signed out to Dr. Sabra Heck.   Final Clinical Impression(s) / ED Diagnoses Final diagnoses:  None    Rx / DC Orders ED Discharge Orders     None        Lorelle Gibbs, DO 04/12/21 1510    Lamae Fosco, Alvin Critchley, DO 04/12/21 1547

## 2021-04-13 DIAGNOSIS — E119 Type 2 diabetes mellitus without complications: Secondary | ICD-10-CM | POA: Diagnosis not present

## 2021-04-14 DIAGNOSIS — E119 Type 2 diabetes mellitus without complications: Secondary | ICD-10-CM | POA: Diagnosis not present

## 2021-04-15 DIAGNOSIS — E119 Type 2 diabetes mellitus without complications: Secondary | ICD-10-CM | POA: Diagnosis not present

## 2021-04-16 ENCOUNTER — Encounter (INDEPENDENT_AMBULATORY_CARE_PROVIDER_SITE_OTHER): Payer: Self-pay

## 2021-04-16 DIAGNOSIS — E119 Type 2 diabetes mellitus without complications: Secondary | ICD-10-CM | POA: Diagnosis not present

## 2021-04-17 ENCOUNTER — Telehealth: Payer: Self-pay

## 2021-04-17 DIAGNOSIS — E119 Type 2 diabetes mellitus without complications: Secondary | ICD-10-CM | POA: Diagnosis not present

## 2021-04-17 NOTE — Telephone Encounter (Signed)
Copied from [place] [id number]. Topic: General - Other >> Apr 17, 2021 10:28 AM Yvette Rack wrote: Reason for CRM: Pt stated she went to the ED and she was just contacted and told that she was diagnosed with having [facility] Spotted Fever. Pt stated she is going to get her Rx and she was told after completing the antibiotic she will need to have labs drawn for testing. Pt requests call back to advise when lab order will be placed.

## 2021-04-17 NOTE — Telephone Encounter (Signed)
Pt states she finishes her antibiotic 04/24/2021.  Please advise.   Thanks,   -Mickel Baas

## 2021-04-17 NOTE — Progress Notes (Signed)
Jane [name], [place] [address]   CLINIC:  Medical Oncology/Hematology  PCP:  [name], MD [address] [place] [address] [phone]   REASON FOR VISIT:  Follow-up for iron deficiency anemia  CURRENT THERAPY: Intermittent iron infusions  INTERVAL HISTORY:  Jane [name] 55 y.o. female returns for routine follow-up of her iron deficiency anemia.  She was last seen in clinic by NP Faythe Casa on 01/02/2021.  At today's visit, she reports feeling fairly well.  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  She remains on Coumadin, but denies any obvious hemorrhage - no epistaxis, hematemesis, melena, hematochezia, hematuria, or hemoptysis.    Most recent DVT diagnosed during ED visit on 04/12/2021: Venous ultrasound shows obstruction at the level of the left common femoral vein and saphenofemoral junction scarring; appearance of thrombus in the common femoral vein suggests that there is chronic thrombus in the area although superimposed acute DVT in cannot be excluded.  She continues to follow with [facility] (Dr. Donzetta Matters) in [facility] for management of her multiple DVTs.  She is currently on Coumadin and follows with the Coumadin clinic for INR monitoring and dose adjustments.    She denies chest pain and syncopal episodes.    She continues to have chronic fatigue and chronic pain.   Of note, the patient was recently diagnosed with [facility] spotted fever and is currently on a course of doxycycline.  She is reporting associated fatigue, shortness of breath, palpitations, nausea, dizziness, body aches, and headaches.  She has 10% energy (before RMSF was 50%) and 75% appetite. She endorses that she is maintaining a stable weight.    REVIEW OF SYSTEMS:  Review of Systems  Constitutional:  Positive for appetite change and fatigue. Negative for chills,  diaphoresis, fever and unexpected weight change.  HENT:   Negative for lump/mass and nosebleeds.   Eyes:  Negative for eye problems.  Respiratory:  Positive for shortness of breath. Negative for cough and hemoptysis.   Cardiovascular:  Positive for leg swelling and palpitations. Negative for chest pain.  Gastrointestinal:  Positive for nausea. Negative for abdominal pain, blood in stool, constipation, diarrhea and vomiting.  Genitourinary:  Negative for hematuria.   Musculoskeletal:  Positive for back pain and myalgias.  Skin: Negative.   Neurological:  Positive for dizziness, headaches and numbness. Negative for light-headedness.  Hematological:  Does not bruise/bleed easily.  Psychiatric/Behavioral:  Positive for sleep disturbance.      PAST MEDICAL/SURGICAL HISTORY:  Past Medical History:  Diagnosis Date   ADD (attention deficit disorder)    Anxiety    Back pain    Bilateral swelling of feet    Bipolar 1 disorder (HCC)    Bipolar 1 disorder (HCC)    Bipolar disorder (HCC)    Chewing difficulty    Chronic fatigue syndrome    Chronic kidney disease    Stage 3 kidney disease;dx by Dr. Sinda Du.    Constipation    Depression    Diabetes mellitus without complication (HCC)    diet controlled   Dyspnea    with exertion   GERD (gastroesophageal reflux disease)    Headache    migraines   High cholesterol    History of blood clots    History of DVT (deep vein thrombosis)    left leg   Hypertension    states under control with meds., has been  on med. x 2 years   Hypothyroidism    Joint pain    Neuropathy    Obsessive-compulsive disorder    Peripheral vascular disease ([place])    Prediabetes    Respiratory failure requiring intubation ([place])    Restless leg syndrome    Shortness of breath    Sleep apnea    Thrombocytopenia ([place]) 09/15/2019   Trigger thumb of left hand 01/2018   Trigger thumb of right hand    Past Surgical History:  Procedure Laterality Date    ABDOMINAL HYSTERECTOMY  06/2016   complete   APPLICATION OF WOUND VAC Left 04/27/2019   Procedure: APPLICATION OF WOUND VAC LEFT GROIN;  Surgeon: Waynetta Sandy, MD;  Location: [place];  Service: Vascular;  Laterality: Left;   AV FISTULA PLACEMENT Left 11/24/2018   Procedure: ARTERIOVENOUS (AV) FISTULA CREATION LEFT SFA TO LEFT FEMORAL VEIN;  Surgeon: Waynetta Sandy, MD;  Location: [place];  Service: Vascular;  Laterality: Left;   BACK SURGERY     CHOLECYSTECTOMY     COLONOSCOPY WITH PROPOFOL N/A 11/22/2019   Procedure: COLONOSCOPY WITH PROPOFOL;  Surgeon: [name], MD;  Location: [facility];  Service: Gastroenterology;  Laterality: N/A;   FEMORAL ARTERY EXPLORATION  03/30/2019   Procedure: Left Common Femoral Artery and Vein Exploration;  Surgeon: Waynetta Sandy, MD;  Location: [place];  Service: Vascular;;   FEMORAL-FEMORAL BYPASS GRAFT Left 11/24/2018   Procedure: BYPASS GRAFT FEMORAL-FEMORAL VENOUS LEFT TO RIGHT PALMA PROCEDURE USING CRYOVEIN;  Surgeon: Waynetta Sandy, MD;  Location: [place];  Service: Vascular;  Laterality: Left;   GROIN DEBRIDEMENT Left 04/27/2019   Procedure: GROIN DEBRIDEMENT;  Surgeon: Waynetta Sandy, MD;  Location: [place];  Service: Vascular;  Laterality: Left;   INSERTION OF ILIAC STENT  03/30/2019   Procedure: Stent of left common, external iliac veins and left common femoral vein;  Surgeon: Waynetta Sandy, MD;  Location: [place];  Service: Vascular;;   KNEE ARTHROSCOPY WITH MENISCAL REPAIR Left 11/14/2020   Procedure: LEFT KNEE ARTHROSCOPY WITH PARTIAL MEDIAL MENISCECTOMY;  Surgeon: Carole Civil, MD;  Location: [facility];  Service: Orthopedics;  Laterality: Left;   LOWER EXTREMITY VENOGRAPHY N/A 08/17/2018   Procedure: LOWER EXTREMITY VENOGRAPHY - Central Venogram;  Surgeon: Waynetta Sandy, MD;  Location: [place] [facility];  Service: Cardiovascular;  Laterality: N/A;   LOWER EXTREMITY VENOGRAPHY  Bilateral 03/09/2019   Procedure: LOWER EXTREMITY VENOGRAPHY;  Surgeon: Waynetta Sandy, MD;  Location: [place] [facility];  Service: Cardiovascular;  Laterality: Bilateral;   LOWER EXTREMITY VENOGRAPHY Left 08/16/2019   Procedure: LOWER EXTREMITY VENOGRAPHY;  Surgeon: Waynetta Sandy, MD;  Location: [place] [facility];  Service: Cardiovascular;  Laterality: Left;   LUMBAR FUSION  11/21/2000   L5-S1   LUMBAR SPINE SURGERY     x 2 others   PATCH ANGIOPLASTY Left 03/30/2019   Procedure: Patch Angioplasty of the Left Common Femoral Vein using Venosure Biologic patch;  Surgeon: Waynetta Sandy, MD;  Location: [place];  Service: Vascular;  Laterality: Left;   PERIPHERAL VASCULAR INTERVENTION Left 08/16/2019   Procedure: PERIPHERAL VASCULAR INTERVENTION;  Surgeon: Waynetta Sandy, MD;  Location: [place] [facility];  Service: Cardiovascular;  Laterality: Left;  common femoral/femoral vein stent   TRIGGER FINGER RELEASE Right 12/01/2017   Procedure: RELEASE TRIGGER FINGER/A-1 PULLEY RIGHT THUMB;  Surgeon: Leanora Cover, MD;  Location: [place];  Service: Orthopedics;  Laterality: Right;   TRIGGER FINGER  RELEASE Left 01/26/2018   Procedure: LEFT TRIGGER THUMB RELEASE;  Surgeon: Leanora Cover, MD;  Location: [place];  Service: Orthopedics;  Laterality: Left;   ULTRASOUND GUIDANCE FOR VASCULAR ACCESS Right 03/30/2019   Procedure: Ultrasound-guided cannulation right internal jugular vein;  Surgeon: Waynetta Sandy, MD;  Location: [place];  Service: Vascular;  Laterality: Right;     SOCIAL HISTORY:  Social History   Socioeconomic History   Marital status: Divorced    Spouse name: Not on file   Number of children: 3   Years of education: Not on file   Highest education level: Not on file  Occupational History   Not on file  Tobacco Use   Smoking status: Never   Smokeless tobacco: Never  Vaping Use   Vaping Use: Never used   Substance and Sexual Activity   Alcohol use: No   Drug use: No   Sexual activity: Not Currently    Partners: Male    Birth control/protection: Surgical  Other Topics Concern   Not on file  Social History Narrative   Not on file   Social Determinants of Health   Financial Resource Strain: High Risk   Difficulty of Paying Living Expenses: Hard  Food Insecurity: Food Insecurity Present   Worried About Running Out of Food in the Last Year: Sometimes true   Ran Out of Food in the Last Year: Never true  Transportation Needs: No Transportation Needs   Lack of Transportation (Medical): No   Lack of Transportation (Non-Medical): No  Physical Activity: Inactive   Days of Exercise per Week: 0 days   Minutes of Exercise per Session: 0 min  Stress: Stress Concern Present   Feeling of Stress : Very much  Social Connections: Socially Isolated   Frequency of Communication with Friends and Family: More than three times a week   Frequency of Social Gatherings with Friends and Family: Three times a week   Attends Religious Services: Never   Active Member of Clubs or Organizations: No   Attends Music therapist: Never   Marital Status: Divorced  Human resources officer Violence: Not At Risk   Fear of Current or Ex-Partner: No   Emotionally Abused: No   Physically Abused: No   Sexually Abused: No    FAMILY HISTORY:  Family History  Problem Relation Age of Onset   Heart disease Mother    Hyperlipidemia Mother    Hypertension Mother    Bipolar disorder Mother    Stroke Mother    Depression Mother    Sleep apnea Mother    Obesity Mother    Diabetes Father    Heart disease Father    Hyperlipidemia Father    Hypertension Father    Sleep apnea Father    Obesity Father    Drug abuse Daughter    ADD / ADHD Daughter    Drug abuse Daughter    Anxiety disorder Daughter    Bipolar disorder Daughter    Hypertension Sister    Hypertension Brother    Hyperlipidemia Brother     Heart disease Brother    Bipolar disorder Maternal Aunt    Suicidality Maternal Aunt     CURRENT MEDICATIONS:  Outpatient Encounter Medications as of 04/18/2021  Medication Sig   ALPRAZolam (XANAX) 1 MG tablet Take 1 tablet (1 mg total) by mouth 2 (two) times daily as needed for anxiety.   amphetamine-dextroamphetamine (ADDERALL) 30 MG tablet Take 1 tablet by mouth 2 (two) times daily.  aspirin EC 81 MG tablet Take 81 mg by mouth daily.   atorvastatin (LIPITOR) 20 MG tablet TAKE 1 TABLET BY MOUTH DAILY (Patient taking differently: Take 20 mg by mouth daily.)   cetirizine (ZYRTEC) 10 MG tablet TAKE 1 TABLET BY MOUTH DAILY (Patient taking differently: Take 10 mg by mouth daily.)   cycloSPORINE (RESTASIS) 0.05 % ophthalmic emulsion Place 1 drop into both eyes 2 (two) times daily.   FLUoxetine (PROZAC) 40 MG capsule TAKE 1 TABLET BY MOUTH DAILY (Patient taking differently: Take 40 mg by mouth daily. TAKE 1 TABLET BY MOUTH DAILY)   GARLIC PO Take 9,147 mg by mouth daily.   hydrochlorothiazide (HYDRODIURIL) 25 MG tablet TAKE 1 TABLET BY MOUTH DAILY (Patient taking differently: Take 25 mg by mouth daily.)   [START ON 04/22/2021] HYDROcodone-acetaminophen (NORCO) 10-325 MG tablet Take 1 tablet by mouth every 6 (six) hours as needed for severe pain. Must last 30 days.   levothyroxine (SYNTHROID) 125 MCG tablet Take 125 mcg by mouth daily before breakfast.   LINZESS 145 MCG CAPS capsule TAKE ONE CAPSULE BY MOUTH DAILY (Patient taking differently: Take 145 mcg by mouth daily as needed.)   methocarbamol (ROBAXIN) 750 MG tablet Take 1 tablet (750 mg total) by mouth 2 (two) times daily as needed for muscle spasms.   Multiple Vitamin (MULTIVITAMIN) capsule Take 1 capsule by mouth daily.   Olopatadine HCl 0.2 % SOLN Place 1 drop into both eyes daily.   Omega-3 Fatty Acids (OMEGA-3 FISH OIL PO) Take 600 mg by mouth daily.   pantoprazole (PROTONIX) 40 MG tablet TAKE 1 TABLET BY MOUTH DAILY (Patient taking  differently: Take 40 mg by mouth daily.)   potassium chloride (KLOR-CON) 10 MEQ tablet TAKE 1 TABLET BY MOUTH THREE TIMES DAILY   pregabalin (LYRICA) 100 MG capsule Take 100 mg by mouth 4 (four) times daily.   rOPINIRole (REQUIP) 2 MG tablet TAKE 1 TABLET BY MOUTH TWICE DAILY (Patient taking differently: Take 2 mg by mouth 2 (two) times daily.)   warfarin (COUMADIN) 5 MG tablet TAKE 1 AND 1/2 TABLETS BY MOUTH ON TUEDAYS AND THURSDAYS, THEN TAKE 1 TABLET DAILY ON ALL OTHER DAYS (Patient taking differently: See admin instructions. Take 2.5 mg on Mon and Wednesdays and 5 tablet on the rest of the days)   No facility-administered encounter medications on file as of 04/18/2021.    ALLERGIES:  Allergies  Allergen Reactions   Aripiprazole Other (See Comments)    BECOMES  VIOLENT    Seroquel [Quetiapine Fumarate] Other (See Comments)    BECOMES VIOLENT   Chlorpromazine Other (See Comments)    SEVERE ANXIETY    Gabapentin Other (See Comments)    NIGHTMARES      PHYSICAL EXAM:  ECOG PERFORMANCE STATUS: 1 - Symptomatic but completely ambulatory  There were no vitals filed for this visit. There were no vitals filed for this visit. Physical Exam Constitutional:      Appearance: Normal appearance. She is obese.  HENT:     Head: Normocephalic and atraumatic.     Mouth/Throat:     Mouth: Mucous membranes are moist.  Eyes:     Extraocular Movements: Extraocular movements intact.     Pupils: Pupils are equal, round, and reactive to light.  Cardiovascular:     Rate and Rhythm: Normal rate and regular rhythm.     Pulses: Normal pulses.     Heart sounds: Normal heart sounds.  Pulmonary:     Effort: Pulmonary effort is normal.  Breath sounds: Normal breath sounds.  Abdominal:     General: Bowel sounds are normal.     Palpations: Abdomen is soft.     Tenderness: There is no abdominal tenderness.  Musculoskeletal:        General: No swelling.     Right lower leg: No edema.     Left  lower leg: No edema.  Lymphadenopathy:     Cervical: No cervical adenopathy.  Skin:    General: Skin is warm and dry.  Neurological:     General: No focal deficit present.     Mental Status: She is alert and oriented to person, place, and time.  Psychiatric:        Mood and Affect: Mood normal.        Behavior: Behavior normal.     LABORATORY DATA:  I have reviewed the labs as listed.  CBC    Component Value Date/Time   WBC 7.2 04/12/2021 1341   RBC 4.40 04/12/2021 1341   HGB 13.6 04/12/2021 1341   HGB 13.8 03/22/2021 1023   HCT 41.1 04/12/2021 1341   HCT 41.3 03/22/2021 1023   PLT 146 (L) 04/12/2021 1341   PLT 136 (L) 03/22/2021 1023   MCV 93.4 04/12/2021 1341   MCV 90 03/22/2021 1023   MCH 30.9 04/12/2021 1341   MCHC 33.1 04/12/2021 1341   RDW 13.4 04/12/2021 1341   RDW 13.1 03/22/2021 1023   LYMPHSABS 1.6 04/12/2021 1341   LYMPHSABS 1.4 03/22/2021 1023   MONOABS 0.6 04/12/2021 1341   EOSABS 0.1 04/12/2021 1341   EOSABS 0.1 03/22/2021 1023   BASOSABS 0.0 04/12/2021 1341   BASOSABS 0.0 03/22/2021 1023   CMP Latest Ref Rng & Units 04/12/2021 04/12/2021 03/22/2021  Glucose 70 - 99 mg/dL 106(H) 103(H) 106(H)  BUN 6 - 20 mg/dL 33(H) 34(H) 29(H)  Creatinine 0.44 - 1.00 mg/dL 1.23(H) 1.26(H) 1.36(H)  Sodium 135 - 145 mmol/L 140 141 142  Potassium 3.5 - 5.1 mmol/L 3.8 3.9 3.8  Chloride 98 - 111 mmol/L 101 102 104  CO2 22 - 32 mmol/L 32 32 22  Calcium 8.9 - 10.3 mg/dL 9.5 9.5 9.2  Total Protein 6.5 - 8.1 g/dL - 7.4 7.2  Total Bilirubin 0.3 - 1.2 mg/dL - 0.6 0.4  Alkaline Phos 38 - 126 U/L - 95 115  AST 15 - 41 U/L - 17 17  ALT 0 - 44 U/L - 20 19    DIAGNOSTIC IMAGING:  I have independently reviewed the relevant imaging and discussed with the patient.  ASSESSMENT & PLAN: 1.  Normocytic anemia: - Colonoscopy on 11/22/2019 shows 3 small polyps in the ascending colon and the cecum.  Nonbleeding internal hemorrhoids.  Pathology consistent with tubular adenoma. - Failed  oral iron supplementation: She has taken iron pills in the past without improvement. - She had a history of blood transfusions in the past when she had her hysterectomy, and also required transfusion after large intraoperative blood loss (3.6 L) during vascular surgery in May 2020 - Combination anemia from CKD and malabsorption-related iron deficiency. - Denies any current signs or symptoms of blood loss, no melena or bright red blood per rectum - Last IV iron infusion (Feraheme) on 04/26/2020 and 05/03/2020 - We reviewed labs from 04/12/2021: Hgb 13.6/MCV 93.4, creatinine 1.26 (GFR 51), ferritin 82, iron saturation 16% - PLAN: We will proceed with Feraheme x1 due to moderately low iron saturation with marginal ferritin in the setting of extreme fatigue (goal ferritin 100).  Repeat labs and RTC in 4 months.    2.  Recurrent left leg DVTs: - She had first episode of left leg DVT in 2014.  She had a recurrent episode was while she was taking Xarelto.  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 - She follows with Dr. Donzetta Matters at [facility] in [place] - She is currently on Coumadin, also follows with the Coumadin clinic for INR checks and dose adjustments. - Most recent DVT diagnosed during ED visit on 04/12/2021: Venous ultrasound shows obstruction at the level of the left common femoral vein and saphenofemoral junction scarring; appearance of thrombus in the common femoral vein suggests that there is chronic thrombus in the area although superimposed acute DVT in the left cannot be excluded - PLAN: She has appointment to see vascular specialists on 05/04/2021   PLAN SUMMARY & DISPOSITION: - IV Feraheme x1 - Labs and RTC in 4 months  All questions were answered. The patient knows to call the clinic with any problems, questions or concerns.  Medical decision making: Low  Time spent on visit: I spent 15 minutes counseling  the patient face to face. The total time spent in the appointment was 25 minutes and more than 50% was on counseling.   Harriett Rush, PA-C  04/18/21 5:25 PM

## 2021-04-17 NOTE — Telephone Encounter (Signed)
Patient called to make a follow up appointment to touch base with Dr. Donzetta Matters s/p [facility] ED visit for DVT. She is on coumadin and had ultrasounds at ED. Placed on schedule for 05/04/21.

## 2021-04-17 NOTE — Telephone Encounter (Signed)
The only thing I seen in her record is er visit for DVT. There is nothing about [place] spotted fever or follow up labs.

## 2021-04-18 ENCOUNTER — Inpatient Hospital Stay (HOSPITAL_BASED_OUTPATIENT_CLINIC_OR_DEPARTMENT_OTHER): Payer: Medicaid Other | Admitting: Physician Assistant

## 2021-04-18 VITALS — BP 127/68 | HR 67 | Temp 97.9°F | Resp 16 | Wt 270.7 lb

## 2021-04-18 DIAGNOSIS — D509 Iron deficiency anemia, unspecified: Secondary | ICD-10-CM | POA: Insufficient documentation

## 2021-04-18 DIAGNOSIS — E119 Type 2 diabetes mellitus without complications: Secondary | ICD-10-CM | POA: Diagnosis not present

## 2021-04-18 LAB — TOXASSURE SELECT 13 (MW), URINE

## 2021-04-18 NOTE — Patient Instructions (Signed)
[place] at [facility] Discharge Instructions  You were seen today by Tarri Abernethy PA-C for your iron deficiency and anemia.  Your blood levels are currently normal, but your iron is mildly low.  Recommend 1 transfusion with IV iron and follow-up again in 4 months.  LABS: Return in 4 months for repeat labs  OTHER TESTS: Continue follow-up with [facility] for your recurrent DVTs and anticoagulation  MEDICATIONS: No changes  FOLLOW-UP APPOINTMENT: Office visit in 4 months   Thank you for choosing [place] at [facility] to provide your oncology and hematology care.  To afford each patient quality time with our provider, please arrive at least 15 minutes before your scheduled appointment time.   If you have a lab appointment with the [facility] please come in thru the Main Entrance and check in at the main information desk.  You need to re-schedule your appointment should you arrive 10 or more minutes late.  We strive to give you quality time with our providers, and arriving late affects you and other patients whose appointments are after yours.  Also, if you no show three or more times for appointments you may be dismissed from the clinic at the providers discretion.     Again, thank you for choosing [facility].  Our hope is that these requests will decrease the amount of time that you wait before being seen by our physicians.       _____________________________________________________________  Should you have questions after your visit to [facility], please contact our office at [phone] and follow the prompts.  Our office hours are 8:00 a.m. and 4:30 p.m. Monday - Friday.  Please note that voicemails left after 4:00 p.m. may not be returned until the following business day.  We are closed weekends and major holidays.  You do have access to a nurse 24-7, just call the main number to the clinic  [phone] and do not press any options, hold on the line and a nurse will answer the phone.    For prescription refill requests, have your pharmacy contact our office and allow 72 hours.    Due to Covid, you will need to wear a mask upon entering the hospital. If you do not have a mask, a mask will be given to you at the Main Entrance upon arrival. For doctor visits, patients may have 1 support person age 26 or older with them. For treatment visits, patients can not have anyone with them due to social distancing guidelines and our immunocompromised population.

## 2021-04-19 ENCOUNTER — Encounter (INDEPENDENT_AMBULATORY_CARE_PROVIDER_SITE_OTHER): Payer: Self-pay

## 2021-04-19 ENCOUNTER — Ambulatory Visit (INDEPENDENT_AMBULATORY_CARE_PROVIDER_SITE_OTHER): Payer: Medicaid Other | Admitting: Family Medicine

## 2021-04-19 DIAGNOSIS — E119 Type 2 diabetes mellitus without complications: Secondary | ICD-10-CM | POA: Diagnosis not present

## 2021-04-20 DIAGNOSIS — E119 Type 2 diabetes mellitus without complications: Secondary | ICD-10-CM | POA: Diagnosis not present

## 2021-04-21 DIAGNOSIS — E119 Type 2 diabetes mellitus without complications: Secondary | ICD-10-CM | POA: Diagnosis not present

## 2021-04-22 DIAGNOSIS — E119 Type 2 diabetes mellitus without complications: Secondary | ICD-10-CM | POA: Diagnosis not present

## 2021-04-23 DIAGNOSIS — E119 Type 2 diabetes mellitus without complications: Secondary | ICD-10-CM | POA: Diagnosis not present

## 2021-04-24 DIAGNOSIS — E119 Type 2 diabetes mellitus without complications: Secondary | ICD-10-CM | POA: Diagnosis not present

## 2021-04-25 ENCOUNTER — Ambulatory Visit: Payer: Self-pay

## 2021-04-25 ENCOUNTER — Ambulatory Visit (INDEPENDENT_AMBULATORY_CARE_PROVIDER_SITE_OTHER): Payer: Medicaid Other

## 2021-04-25 ENCOUNTER — Other Ambulatory Visit: Payer: Self-pay

## 2021-04-25 DIAGNOSIS — E119 Type 2 diabetes mellitus without complications: Secondary | ICD-10-CM | POA: Diagnosis not present

## 2021-04-25 DIAGNOSIS — Z86718 Personal history of other venous thrombosis and embolism: Secondary | ICD-10-CM

## 2021-04-25 LAB — POCT INR
INR: 2.4 (ref 2.0–3.0)
PT: 29

## 2021-04-25 NOTE — Patient Instructions (Signed)
Description   Continue 5mg  daily except 2.5mg  Monday, Wednesday.  Recheck in 2 weeks.

## 2021-04-26 ENCOUNTER — Ambulatory Visit (INDEPENDENT_AMBULATORY_CARE_PROVIDER_SITE_OTHER): Payer: Medicaid Other | Admitting: Family Medicine

## 2021-04-26 ENCOUNTER — Encounter (INDEPENDENT_AMBULATORY_CARE_PROVIDER_SITE_OTHER): Payer: Self-pay

## 2021-04-26 DIAGNOSIS — E119 Type 2 diabetes mellitus without complications: Secondary | ICD-10-CM | POA: Diagnosis not present

## 2021-04-27 DIAGNOSIS — R32 Unspecified urinary incontinence: Secondary | ICD-10-CM | POA: Diagnosis not present

## 2021-04-27 DIAGNOSIS — E119 Type 2 diabetes mellitus without complications: Secondary | ICD-10-CM | POA: Diagnosis not present

## 2021-04-28 DIAGNOSIS — E119 Type 2 diabetes mellitus without complications: Secondary | ICD-10-CM | POA: Diagnosis not present

## 2021-04-29 DIAGNOSIS — E119 Type 2 diabetes mellitus without complications: Secondary | ICD-10-CM | POA: Diagnosis not present

## 2021-04-30 DIAGNOSIS — E119 Type 2 diabetes mellitus without complications: Secondary | ICD-10-CM | POA: Diagnosis not present

## 2021-05-01 DIAGNOSIS — E119 Type 2 diabetes mellitus without complications: Secondary | ICD-10-CM | POA: Diagnosis not present

## 2021-05-02 DIAGNOSIS — E119 Type 2 diabetes mellitus without complications: Secondary | ICD-10-CM | POA: Diagnosis not present

## 2021-05-03 DIAGNOSIS — E119 Type 2 diabetes mellitus without complications: Secondary | ICD-10-CM | POA: Diagnosis not present

## 2021-05-04 ENCOUNTER — Other Ambulatory Visit: Payer: Self-pay

## 2021-05-04 ENCOUNTER — Ambulatory Visit (INDEPENDENT_AMBULATORY_CARE_PROVIDER_SITE_OTHER): Payer: Medicaid Other | Admitting: Vascular Surgery

## 2021-05-04 ENCOUNTER — Encounter: Payer: Self-pay | Admitting: Vascular Surgery

## 2021-05-04 VITALS — BP 131/68 | HR 67 | Temp 98.1°F | Resp 20 | Ht 70.0 in | Wt 269.0 lb

## 2021-05-04 DIAGNOSIS — E119 Type 2 diabetes mellitus without complications: Secondary | ICD-10-CM | POA: Diagnosis not present

## 2021-05-04 DIAGNOSIS — I87002 Postthrombotic syndrome without complications of left lower extremity: Secondary | ICD-10-CM | POA: Diagnosis not present

## 2021-05-04 DIAGNOSIS — M7989 Other specified soft tissue disorders: Secondary | ICD-10-CM | POA: Diagnosis not present

## 2021-05-04 NOTE — Progress Notes (Signed)
Patient ID: Jane [name], female   DOB: January 13, 1966, 55 y.o.   MRN: [id number]  Reason for Consult: Follow-up   Referred by [name], MD  Subjective:     HPI:  Jane [name] is a 55 y.o. female with extensive DVT history including a left to right femorofemoral venous bypass that thrombosed has left common external iliac vein as well as common femoral vein stenting.  She states that she was having a rash in her right lower extremity swelling of her bilateral lower extremities ultimately she was diagnosed with [facility] spotted fever.  That episode of swelling was accompanied by severe heaviness and pain in her legs although no tissue loss or skin discoloration other than the rash.  She states that she does have persistent swelling of her left more than right lower extremity but this time they feel relatively stable.  She remains on Coumadin.  She does not wear compression stockings.  She has has had resolution of the most recent episode of swelling following treatment with tetracycline  Past Medical History:  Diagnosis Date   ADD (attention deficit disorder)    Anxiety    Back pain    Bilateral swelling of feet    Bipolar 1 disorder (HCC)    Bipolar 1 disorder (HCC)    Bipolar disorder (HCC)    Chewing difficulty    Chronic fatigue syndrome    Chronic kidney disease    Stage 3 kidney disease;dx by Dr. Sinda Du.    Constipation    Depression    Diabetes mellitus without complication (HCC)    diet controlled   Dyspnea    with exertion   GERD (gastroesophageal reflux disease)    Headache    migraines   High cholesterol    History of blood clots    History of DVT (deep vein thrombosis)    left leg   Hypertension    states under control with meds., has been on med. x 2 years   Hypothyroidism    Joint pain    Neuropathy    Obsessive-compulsive disorder    Peripheral vascular disease ([place])    Prediabetes    Respiratory failure requiring intubation ([place])     Restless leg syndrome    Shortness of breath    Sleep apnea    Thrombocytopenia ([place]) 09/15/2019   Trigger thumb of left hand 01/2018   Trigger thumb of right hand    Family History  Problem Relation Age of Onset   Heart disease Mother    Hyperlipidemia Mother    Hypertension Mother    Bipolar disorder Mother    Stroke Mother    Depression Mother    Sleep apnea Mother    Obesity Mother    Diabetes Father    Heart disease Father    Hyperlipidemia Father    Hypertension Father    Sleep apnea Father    Obesity Father    Drug abuse Daughter    ADD / ADHD Daughter    Drug abuse Daughter    Anxiety disorder Daughter    Bipolar disorder Daughter    Hypertension Sister    Hypertension Brother    Hyperlipidemia Brother    Heart disease Brother    Bipolar disorder Maternal Aunt    Suicidality Maternal Aunt    Past Surgical History:  Procedure Laterality Date   ABDOMINAL HYSTERECTOMY  06/2016   complete   APPLICATION OF WOUND VAC Left 04/27/2019   Procedure: APPLICATION OF WOUND  VAC LEFT GROIN;  Surgeon: Waynetta Sandy, MD;  Location: [place];  Service: Vascular;  Laterality: Left;   AV FISTULA PLACEMENT Left 11/24/2018   Procedure: ARTERIOVENOUS (AV) FISTULA CREATION LEFT SFA TO LEFT FEMORAL VEIN;  Surgeon: Waynetta Sandy, MD;  Location: [place];  Service: Vascular;  Laterality: Left;   BACK SURGERY     CHOLECYSTECTOMY     COLONOSCOPY WITH PROPOFOL N/A 11/22/2019   Procedure: COLONOSCOPY WITH PROPOFOL;  Surgeon: [name], MD;  Location: [facility];  Service: Gastroenterology;  Laterality: N/A;   FEMORAL ARTERY EXPLORATION  03/30/2019   Procedure: Left Common Femoral Artery and Vein Exploration;  Surgeon: Waynetta Sandy, MD;  Location: [place];  Service: Vascular;;   FEMORAL-FEMORAL BYPASS GRAFT Left 11/24/2018   Procedure: BYPASS GRAFT FEMORAL-FEMORAL VENOUS LEFT TO RIGHT PALMA PROCEDURE USING CRYOVEIN;  Surgeon: Waynetta Sandy, MD;   Location: [place];  Service: Vascular;  Laterality: Left;   GROIN DEBRIDEMENT Left 04/27/2019   Procedure: GROIN DEBRIDEMENT;  Surgeon: Waynetta Sandy, MD;  Location: [place];  Service: Vascular;  Laterality: Left;   INSERTION OF ILIAC STENT  03/30/2019   Procedure: Stent of left common, external iliac veins and left common femoral vein;  Surgeon: Waynetta Sandy, MD;  Location: [place];  Service: Vascular;;   KNEE ARTHROSCOPY WITH MENISCAL REPAIR Left 11/14/2020   Procedure: LEFT KNEE ARTHROSCOPY WITH PARTIAL MEDIAL MENISCECTOMY;  Surgeon: Carole Civil, MD;  Location: [facility];  Service: Orthopedics;  Laterality: Left;   LOWER EXTREMITY VENOGRAPHY N/A 08/17/2018   Procedure: LOWER EXTREMITY VENOGRAPHY - Central Venogram;  Surgeon: Waynetta Sandy, MD;  Location: [place] [facility];  Service: Cardiovascular;  Laterality: N/A;   LOWER EXTREMITY VENOGRAPHY Bilateral 03/09/2019   Procedure: LOWER EXTREMITY VENOGRAPHY;  Surgeon: Waynetta Sandy, MD;  Location: [place] CV LAB;  Service: Cardiovascular;  Laterality: Bilateral;   LOWER EXTREMITY VENOGRAPHY Left 08/16/2019   Procedure: LOWER EXTREMITY VENOGRAPHY;  Surgeon: Waynetta Sandy, MD;  Location: [place] CV LAB;  Service: Cardiovascular;  Laterality: Left;   LUMBAR FUSION  11/21/2000   L5-S1   LUMBAR SPINE SURGERY     x 2 others   PATCH ANGIOPLASTY Left 03/30/2019   Procedure: Patch Angioplasty of the Left Common Femoral Vein using Venosure Biologic patch;  Surgeon: Waynetta Sandy, MD;  Location: [place];  Service: Vascular;  Laterality: Left;   PERIPHERAL VASCULAR INTERVENTION Left 08/16/2019   Procedure: PERIPHERAL VASCULAR INTERVENTION;  Surgeon: Waynetta Sandy, MD;  Location: [place] CV [facility];  Service: Cardiovascular;  Laterality: Left;  common femoral/femoral vein stent   TRIGGER FINGER RELEASE Right 12/01/2017   Procedure: RELEASE TRIGGER FINGER/A-1 PULLEY RIGHT  THUMB;  Surgeon: Leanora Cover, MD;  Location: [place];  Service: Orthopedics;  Laterality: Right;   TRIGGER FINGER RELEASE Left 01/26/2018   Procedure: LEFT TRIGGER THUMB RELEASE;  Surgeon: Leanora Cover, MD;  Location: [place];  Service: Orthopedics;  Laterality: Left;   ULTRASOUND GUIDANCE FOR VASCULAR ACCESS Right 03/30/2019   Procedure: Ultrasound-guided cannulation right internal jugular vein;  Surgeon: Waynetta Sandy, MD;  Location: [place];  Service: Vascular;  Laterality: Right;    Short Social History:  Social History   Tobacco Use   Smoking status: Never   Smokeless tobacco: Never  Substance Use Topics   Alcohol use: No    Allergies  Allergen Reactions   Aripiprazole Other (See Comments)    BECOMES  VIOLENT  Seroquel [Quetiapine Fumarate] Other (See Comments)    BECOMES VIOLENT   Chlorpromazine Other (See Comments)    SEVERE ANXIETY    Gabapentin Other (See Comments)    NIGHTMARES     Current Outpatient Medications  Medication Sig Dispense Refill   ALPRAZolam (XANAX) 1 MG tablet Take 1 tablet (1 mg total) by mouth 2 (two) times daily as needed for anxiety. 60 tablet 2   amphetamine-dextroamphetamine (ADDERALL) 30 MG tablet Take 1 tablet by mouth 2 (two) times daily. 60 tablet 0   aspirin EC 81 MG tablet Take 81 mg by mouth daily.     atorvastatin (LIPITOR) 20 MG tablet TAKE 1 TABLET BY MOUTH DAILY (Patient taking differently: Take 20 mg by mouth daily.) 90 tablet 0   cetirizine (ZYRTEC) 10 MG tablet Take 1 tablet by mouth every morning.     cycloSPORINE (RESTASIS) 0.05 % ophthalmic emulsion Place 1 drop into both eyes 2 (two) times daily.     FLUoxetine (PROZAC) 40 MG capsule TAKE 1 TABLET BY MOUTH DAILY (Patient taking differently: Take 40 mg by mouth daily. TAKE 1 TABLET BY MOUTH DAILY) 90 capsule 2   GARLIC PO Take 1,287 mg by mouth daily.     hydrochlorothiazide (HYDRODIURIL) 25 MG tablet TAKE 1 TABLET BY MOUTH DAILY  (Patient taking differently: Take 25 mg by mouth daily.) 90 tablet 0   HYDROcodone-acetaminophen (NORCO) 10-325 MG tablet Take 1 tablet by mouth every 6 (six) hours as needed for severe pain. Must last 30 days. 120 tablet 0   levothyroxine (SYNTHROID) 125 MCG tablet Take 125 mcg by mouth daily before breakfast.     LINZESS 145 MCG CAPS capsule TAKE ONE CAPSULE BY MOUTH DAILY (Patient taking differently: Take 145 mcg by mouth daily as needed.) 90 capsule 3   methocarbamol (ROBAXIN) 750 MG tablet Take 1 tablet (750 mg total) by mouth 2 (two) times daily as needed for muscle spasms. 60 tablet 2   Multiple Vitamin (MULTIVITAMIN) capsule Take 1 capsule by mouth daily.     Olopatadine HCl 0.2 % SOLN Place 1 drop into both eyes daily.     Omega-3 Fatty Acids (OMEGA-3 FISH OIL PO) Take 600 mg by mouth daily.     pantoprazole (PROTONIX) 40 MG tablet TAKE 1 TABLET BY MOUTH DAILY (Patient taking differently: Take 40 mg by mouth daily.) 90 tablet 3   potassium chloride (KLOR-CON) 10 MEQ tablet TAKE 1 TABLET BY MOUTH THREE TIMES DAILY 90 tablet 0   pregabalin (LYRICA) 100 MG capsule Take 100 mg by mouth 4 (four) times daily.     rOPINIRole (REQUIP) 2 MG tablet TAKE 1 TABLET BY MOUTH TWICE DAILY (Patient taking differently: Take 2 mg by mouth 2 (two) times daily.) 180 tablet 3   triamcinolone cream (KENALOG) 0.1 % Apply topically 2 (two) times daily.     warfarin (COUMADIN) 5 MG tablet TAKE 1 AND 1/2 TABLETS BY MOUTH ON TUEDAYS AND THURSDAYS, THEN TAKE 1 TABLET DAILY ON ALL OTHER DAYS (Patient taking differently: See admin instructions. Take 2.5 mg on Mon and Wednesdays and 5 mg tablet on the rest of the days) 60 tablet 1   No current facility-administered medications for this visit.    Review of Systems  Constitutional:  Constitutional negative. HENT: HENT negative.  Eyes: Eyes negative.  Cardiovascular: Positive for leg swelling.  GI: Gastrointestinal negative.  Musculoskeletal: Positive for leg pain.   Skin: Skin negative.  Hematologic: Positive for bruises/bleeds easily.  Psychiatric: Psychiatric negative.  Objective:  Objective   Vitals:   05/04/21 0913  BP: 131/68  Pulse: 67  Resp: 20  Temp: 98.1 F (36.7 C)  SpO2: 95%  Weight: 269 lb (122 kg)  Height: 5\' 10"  (1.778 m)   Body mass index is 38.6 kg/m.  Physical Exam HENT:     Head: Normocephalic.     Nose:     Comments: Wearing a mask Eyes:     Pupils: Pupils are equal, round, and reactive to light.  Cardiovascular:     Rate and Rhythm: Normal rate.     Pulses: Normal pulses.  Pulmonary:     Effort: Pulmonary effort is normal.  Abdominal:     General: Abdomen is flat.     Palpations: Abdomen is soft.  Musculoskeletal:     Cervical back: Normal range of motion.     Comments: Left leg grossly larger than right Right leg measures 39.5 cm, left leg measures 42 cm  Skin:    General: Skin is warm.     Capillary Refill: Capillary refill takes less than 2 seconds.     Comments: Mild discoloration of the left ankle relative to the right with hemosiderin deposition  Neurological:     General: No focal deficit present.     Mental Status: She is alert.  Psychiatric:        Mood and Affect: Mood normal.        Thought Content: Thought content normal.    Data: I reviewed her recent lower extremity duplex as well as CT venogram with her.     Assessment/Plan:     55 year old female with extensive previous venous history recently had pain and swelling of her bilateral lower extremities and a rash on the right associated with [facility] spotted fever.  This is mostly resolved the left leg is a little larger today than previous and also larger than the right.  She is not wearing compression stockings which I have recommended for her.  She does continue Coumadin.  Overall I think her leg appears satisfactory the stents appear patent.  She will follow-up in 6 months for her usual IVC iliac duplex.     Waynetta Sandy MD [facility]

## 2021-05-05 DIAGNOSIS — E119 Type 2 diabetes mellitus without complications: Secondary | ICD-10-CM | POA: Diagnosis not present

## 2021-05-06 DIAGNOSIS — E119 Type 2 diabetes mellitus without complications: Secondary | ICD-10-CM | POA: Diagnosis not present

## 2021-05-07 DIAGNOSIS — E119 Type 2 diabetes mellitus without complications: Secondary | ICD-10-CM | POA: Diagnosis not present

## 2021-05-08 ENCOUNTER — Telehealth: Payer: Self-pay | Admitting: Orthopedic Surgery

## 2021-05-08 ENCOUNTER — Other Ambulatory Visit: Payer: Self-pay

## 2021-05-08 DIAGNOSIS — I871 Compression of vein: Secondary | ICD-10-CM

## 2021-05-08 DIAGNOSIS — E119 Type 2 diabetes mellitus without complications: Secondary | ICD-10-CM | POA: Diagnosis not present

## 2021-05-08 NOTE — Telephone Encounter (Signed)
I called patient back from earlier call/conversation this morning regarding scheduling appointment with Dr Aline Brochure. I reached voice mail, and left message to return call.

## 2021-05-08 NOTE — Telephone Encounter (Signed)
Done; patient aware of scheduled appointment. 

## 2021-05-09 ENCOUNTER — Ambulatory Visit (INDEPENDENT_AMBULATORY_CARE_PROVIDER_SITE_OTHER): Payer: Medicaid Other

## 2021-05-09 ENCOUNTER — Other Ambulatory Visit: Payer: Self-pay

## 2021-05-09 DIAGNOSIS — M4722 Other spondylosis with radiculopathy, cervical region: Secondary | ICD-10-CM | POA: Diagnosis not present

## 2021-05-09 DIAGNOSIS — Z86718 Personal history of other venous thrombosis and embolism: Secondary | ICD-10-CM

## 2021-05-09 DIAGNOSIS — E119 Type 2 diabetes mellitus without complications: Secondary | ICD-10-CM | POA: Diagnosis not present

## 2021-05-09 DIAGNOSIS — M1712 Unilateral primary osteoarthritis, left knee: Secondary | ICD-10-CM | POA: Diagnosis not present

## 2021-05-09 DIAGNOSIS — M791 Myalgia, unspecified site: Secondary | ICD-10-CM | POA: Diagnosis not present

## 2021-05-09 DIAGNOSIS — E039 Hypothyroidism, unspecified: Secondary | ICD-10-CM | POA: Diagnosis not present

## 2021-05-09 DIAGNOSIS — M7551 Bursitis of right shoulder: Secondary | ICD-10-CM | POA: Diagnosis not present

## 2021-05-09 LAB — POCT INR
INR: 2.5 (ref 2.0–3.0)
PT: 30.2

## 2021-05-09 NOTE — Patient Instructions (Signed)
Continue 5mg  daily except 2.5mg  Monday and Wednesday.  Recheck in four weeks.

## 2021-05-10 ENCOUNTER — Ambulatory Visit (INDEPENDENT_AMBULATORY_CARE_PROVIDER_SITE_OTHER): Payer: Medicaid Other | Admitting: Family Medicine

## 2021-05-10 ENCOUNTER — Other Ambulatory Visit: Payer: Self-pay

## 2021-05-10 ENCOUNTER — Encounter (INDEPENDENT_AMBULATORY_CARE_PROVIDER_SITE_OTHER): Payer: Self-pay | Admitting: Family Medicine

## 2021-05-10 VITALS — BP 146/78 | HR 65 | Temp 98.2°F | Ht 70.0 in | Wt 268.0 lb

## 2021-05-10 DIAGNOSIS — E1169 Type 2 diabetes mellitus with other specified complication: Secondary | ICD-10-CM

## 2021-05-10 DIAGNOSIS — E1165 Type 2 diabetes mellitus with hyperglycemia: Secondary | ICD-10-CM

## 2021-05-10 DIAGNOSIS — E785 Hyperlipidemia, unspecified: Secondary | ICD-10-CM

## 2021-05-10 DIAGNOSIS — Z6841 Body Mass Index (BMI) 40.0 and over, adult: Secondary | ICD-10-CM | POA: Diagnosis not present

## 2021-05-10 DIAGNOSIS — E119 Type 2 diabetes mellitus without complications: Secondary | ICD-10-CM | POA: Diagnosis not present

## 2021-05-11 ENCOUNTER — Encounter (HOSPITAL_COMMUNITY): Payer: Self-pay

## 2021-05-11 ENCOUNTER — Inpatient Hospital Stay (HOSPITAL_COMMUNITY): Payer: Medicaid Other | Attending: Hematology

## 2021-05-11 VITALS — BP 166/89 | HR 57 | Temp 97.2°F | Resp 18

## 2021-05-11 DIAGNOSIS — D509 Iron deficiency anemia, unspecified: Secondary | ICD-10-CM | POA: Insufficient documentation

## 2021-05-11 DIAGNOSIS — D649 Anemia, unspecified: Secondary | ICD-10-CM

## 2021-05-11 DIAGNOSIS — E119 Type 2 diabetes mellitus without complications: Secondary | ICD-10-CM | POA: Diagnosis not present

## 2021-05-11 MED ORDER — LORATADINE 10 MG PO TABS
10.0000 mg | ORAL_TABLET | Freq: Once | ORAL | Status: AC
  Administered 2021-05-11: 10 mg via ORAL
  Filled 2021-05-11: qty 1

## 2021-05-11 MED ORDER — ACETAMINOPHEN 325 MG PO TABS
650.0000 mg | ORAL_TABLET | Freq: Once | ORAL | Status: AC
  Administered 2021-05-11: 650 mg via ORAL
  Filled 2021-05-11: qty 2

## 2021-05-11 MED ORDER — SODIUM CHLORIDE 0.9 % IV SOLN: Freq: Once | INTRAVENOUS | Status: AC

## 2021-05-11 MED ORDER — SODIUM CHLORIDE 0.9 % IV SOLN
510.0000 mg | Freq: Once | INTRAVENOUS | Status: AC
  Administered 2021-05-11: 510 mg via INTRAVENOUS
  Filled 2021-05-11: qty 510

## 2021-05-11 NOTE — Patient Instructions (Signed)
[facility]  Discharge Instructions: Thank you for choosing [facility] to provide your oncology and hematology care.  If you have a lab appointment with the Cancer Center, please come in thru the Main Entrance and check in at the main information desk.  Wear comfortable clothing and clothing appropriate for easy access to any Portacath or PICC line.   We strive to give you quality time with your provider. You may need to reschedule your appointment if you arrive late (15 or more minutes).  Arriving late affects you and other patients whose appointments are after yours.  Also, if you miss three or more appointments without notifying the office, you may be dismissed from the clinic at the provider's discretion.      For prescription refill requests, have your pharmacy contact our office and allow 72 hours for refills to be completed.    Today you received the following: Feraheme. Return as scheduled.   To help prevent nausea and vomiting after your treatment, we encourage you to take your nausea medication as directed.  BELOW ARE SYMPTOMS THAT SHOULD BE REPORTED IMMEDIATELY: *FEVER GREATER THAN 100.4 F (38 C) OR HIGHER *CHILLS OR SWEATING *NAUSEA AND VOMITING THAT IS NOT CONTROLLED WITH YOUR NAUSEA MEDICATION *UNUSUAL SHORTNESS OF BREATH *UNUSUAL BRUISING OR BLEEDING *URINARY PROBLEMS (pain or burning when urinating, or frequent urination) *BOWEL PROBLEMS (unusual diarrhea, constipation, pain near the anus) TENDERNESS IN MOUTH AND THROAT WITH OR WITHOUT PRESENCE OF ULCERS (sore throat, sores in mouth, or a toothache) UNUSUAL RASH, SWELLING OR PAIN  UNUSUAL VAGINAL DISCHARGE OR ITCHING   Items with * indicate a potential emergency and should be followed up as soon as possible or go to the Emergency Department if any problems should occur.  Please show the CHEMOTHERAPY ALERT CARD or IMMUNOTHERAPY ALERT CARD at check-in to the Emergency Department and triage  nurse.  Should you have questions after your visit or need to cancel or reschedule your appointment, please contact [facility] [phone]  and follow the prompts.  Office hours are 8:00 a.m. to 4:30 p.m. Monday - Friday. Please note that voicemails left after 4:00 p.m. may not be returned until the following business day.  We are closed weekends and major holidays. You have access to a nurse at all times for urgent questions. Please call the main number to the clinic [phone] and follow the prompts.  For any non-urgent questions, you may also contact your provider using MyChart. We now offer e-Visits for anyone 18 and older to request care online for non-urgent symptoms. For details visit [email or web address].   Also download the MyChart app! Go to the app store, search "MyChart", open the app, select [facility], and log in with your MyChart username and password.  Due to Covid, a mask is required upon entering the hospital/clinic. If you do not have a mask, one will be given to you upon arrival. For doctor visits, patients may have 1 support person aged 18 or older with them. For treatment visits, patients cannot have anyone with them due to current Covid guidelines and our immunocompromised population.  

## 2021-05-11 NOTE — Progress Notes (Signed)
Patient tolerated iron infusion with no complaints voiced.  Peripheral IV site clean and dry with good blood return noted before and after infusion.  Band aid applied.  VSS with discharge and left in satisfactory condition with no s/s of distress noted.   

## 2021-05-12 DIAGNOSIS — E119 Type 2 diabetes mellitus without complications: Secondary | ICD-10-CM | POA: Diagnosis not present

## 2021-05-13 DIAGNOSIS — E119 Type 2 diabetes mellitus without complications: Secondary | ICD-10-CM | POA: Diagnosis not present

## 2021-05-14 DIAGNOSIS — E119 Type 2 diabetes mellitus without complications: Secondary | ICD-10-CM | POA: Diagnosis not present

## 2021-05-15 DIAGNOSIS — E119 Type 2 diabetes mellitus without complications: Secondary | ICD-10-CM | POA: Diagnosis not present

## 2021-05-16 DIAGNOSIS — E119 Type 2 diabetes mellitus without complications: Secondary | ICD-10-CM | POA: Diagnosis not present

## 2021-05-16 NOTE — Progress Notes (Signed)
Chief Complaint:   OBESITY Jane [name] is here to discuss her progress with her obesity treatment plan along with follow-up of her obesity related diagnoses. Jane [name] is on the Category 4 Plan and states she is following her eating plan approximately 0% of the time. Jane [name] states she is not currently exercising.  Today's visit was #: 61 Starting weight: 283 lbs Starting date: 03/29/2020 Today's weight: 268 lbs Today's date: 05/10/2021 Total lbs lost to date: 15 Total lbs lost since last in-office visit: 0  Interim History: Jane [name] has had a very difficult last couple of months due to health issues. Since early June, her legs are very painful. She has really struggled with feeling overwhelmed and depleted due to health issues. Jane [name] is feeling food is getting expensive.  Subjective:   1. Type 2 diabetes mellitus with hyperglycemia, without long-term current use of insulin (HCC) Jane [name]'s last [name] was 6.3. She is not on meds.  2. Hyperlipidemia associated with type 2 diabetes mellitus ([place]) Jane [name] is on Lipitor. She notes myalgias but unrelated to statin.  Assessment/Plan:   1. Type 2 diabetes mellitus with hyperglycemia, without long-term current use of insulin (HCC) Good blood sugar control is important to decrease the likelihood of diabetic complications such as nephropathy, neuropathy, limb loss, blindness, coronary artery disease, and death. Intensive lifestyle modification including diet, exercise and weight loss are the first line of treatment for diabetes.  Repeat labs in 2 months.  2. Hyperlipidemia associated with type 2 diabetes mellitus ([place]) Cardiovascular risk and specific lipid/LDL goals reviewed.  We discussed several lifestyle modifications today and Jane [name] will continue to work on diet, exercise and weight loss efforts. Orders and follow up as documented in patient record.  Continue statin with no change in dose.  Counseling Intensive lifestyle modifications are the first line  treatment for this issue. Dietary changes: Increase soluble fiber. Decrease simple carbohydrates. Exercise changes: Moderate to vigorous-intensity aerobic activity 150 minutes per week if tolerated. Lipid-lowering medications: see documented in medical record.   3. Class 3 severe obesity with serious comorbidity and body mass index (BMI) of 40.0 to 44.9 in adult, unspecified obesity type (HCC)  Jane [name] is currently in the action stage of change. As such, her goal is to continue with weight loss efforts. She has agreed to the Category 4 Plan.   Exercise goals: No exercise has been prescribed at this time.  Behavioral modification strategies: increasing lean protein intake, meal planning and cooking strategies, keeping healthy foods in the home, and planning for success.  Jane [name] has agreed to follow-up with our clinic in 2-3 weeks. She was informed of the importance of frequent follow-up visits to maximize her success with intensive lifestyle modifications for her multiple health conditions.   Objective:   Blood pressure (!) 146/78, pulse 65, temperature 98.2 F (36.8 C), height 5\' 10"  (1.778 m), weight 268 lb (121.6 kg), SpO2 96 %. Body mass index is 38.45 kg/m.  General: Cooperative, alert, well developed, in no acute distress. HEENT: Conjunctivae and lids unremarkable. Cardiovascular: Regular rhythm.  Lungs: Normal work of breathing. Neurologic: No focal deficits.   Lab Results  Component Value Date   CREATININE 1.23 (H) 04/12/2021   BUN 33 (H) 04/12/2021   NA 140 04/12/2021   K 3.8 04/12/2021   CL 101 04/12/2021   CO2 32 04/12/2021   Lab Results  Component Value Date   ALT 20 04/12/2021   AST 17 04/12/2021   ALKPHOS 95 04/12/2021   BILITOT 0.6 04/12/2021  Lab Results  Component Value Date   HGBA1C 6.3 (H) 03/22/2021   HGBA1C 5.8 (A) 10/24/2020   HGBA1C 6.2 (H) 03/29/2020   HGBA1C 6.3 (H) 10/25/2019   HGBA1C 6.3 (H) 03/26/2019   Lab Results  Component Value Date    INSULIN 21.5 03/29/2020   Lab Results  Component Value Date   TSH 0.107 (L) 05/18/2020   Lab Results  Component Value Date   CHOL 203 (H) 03/29/2020   HDL 37 (L) 03/29/2020   LDLCALC 114 (H) 03/29/2020   TRIG 296 (H) 03/29/2020   CHOLHDL 5.0 (H) 10/25/2019   Lab Results  Component Value Date   VD25OH 50.60 01/01/2021   VD25OH 45.30 10/17/2020   VD25OH 37.20 07/18/2020   Lab Results  Component Value Date   WBC 7.2 04/12/2021   HGB 13.6 04/12/2021   HCT 41.1 04/12/2021   MCV 93.4 04/12/2021   PLT 146 (L) 04/12/2021   Lab Results  Component Value Date   IRON 49 04/12/2021   TIBC 304 04/12/2021   FERRITIN 82 04/12/2021    Attestation Statements:   Reviewed by clinician on day of visit: allergies, medications, problem list, medical history, surgical history, family history, social history, and previous encounter notes.  Time spent on visit including pre-visit chart review and post-visit care and charting was 15 minutes.   Coral Ceo, CMA, am acting as transcriptionist for Coralie Common, MD.   I have reviewed the above documentation for accuracy and completeness, and I agree with the above. - Coralie Common, MD

## 2021-05-17 DIAGNOSIS — E119 Type 2 diabetes mellitus without complications: Secondary | ICD-10-CM | POA: Diagnosis not present

## 2021-05-18 DIAGNOSIS — M797 Fibromyalgia: Secondary | ICD-10-CM | POA: Diagnosis not present

## 2021-05-19 DIAGNOSIS — M797 Fibromyalgia: Secondary | ICD-10-CM | POA: Diagnosis not present

## 2021-05-20 DIAGNOSIS — M797 Fibromyalgia: Secondary | ICD-10-CM | POA: Diagnosis not present

## 2021-05-21 ENCOUNTER — Encounter (HOSPITAL_COMMUNITY): Payer: Self-pay | Admitting: Hematology

## 2021-05-21 DIAGNOSIS — M797 Fibromyalgia: Secondary | ICD-10-CM | POA: Diagnosis not present

## 2021-05-22 ENCOUNTER — Encounter (HOSPITAL_COMMUNITY): Payer: Self-pay | Admitting: Hematology

## 2021-05-22 DIAGNOSIS — M797 Fibromyalgia: Secondary | ICD-10-CM | POA: Diagnosis not present

## 2021-05-23 ENCOUNTER — Encounter (HOSPITAL_COMMUNITY): Payer: Self-pay | Admitting: Hematology

## 2021-05-23 DIAGNOSIS — M797 Fibromyalgia: Secondary | ICD-10-CM | POA: Diagnosis not present

## 2021-05-24 ENCOUNTER — Ambulatory Visit (INDEPENDENT_AMBULATORY_CARE_PROVIDER_SITE_OTHER): Payer: Medicaid Other | Admitting: Family Medicine

## 2021-05-24 ENCOUNTER — Encounter (INDEPENDENT_AMBULATORY_CARE_PROVIDER_SITE_OTHER): Payer: Self-pay | Admitting: Family Medicine

## 2021-05-24 ENCOUNTER — Encounter (HOSPITAL_COMMUNITY): Payer: Self-pay | Admitting: Hematology

## 2021-05-24 ENCOUNTER — Other Ambulatory Visit: Payer: Self-pay

## 2021-05-24 VITALS — BP 121/77 | HR 57 | Temp 98.5°F | Ht 70.0 in | Wt 261.0 lb

## 2021-05-24 DIAGNOSIS — Z6841 Body Mass Index (BMI) 40.0 and over, adult: Secondary | ICD-10-CM

## 2021-05-24 DIAGNOSIS — E038 Other specified hypothyroidism: Secondary | ICD-10-CM

## 2021-05-24 DIAGNOSIS — M797 Fibromyalgia: Secondary | ICD-10-CM | POA: Diagnosis not present

## 2021-05-24 DIAGNOSIS — E1165 Type 2 diabetes mellitus with hyperglycemia: Secondary | ICD-10-CM | POA: Diagnosis not present

## 2021-05-25 ENCOUNTER — Encounter (HOSPITAL_COMMUNITY): Payer: Self-pay | Admitting: Hematology

## 2021-05-25 DIAGNOSIS — M797 Fibromyalgia: Secondary | ICD-10-CM | POA: Diagnosis not present

## 2021-05-26 DIAGNOSIS — M797 Fibromyalgia: Secondary | ICD-10-CM | POA: Diagnosis not present

## 2021-05-27 DIAGNOSIS — M797 Fibromyalgia: Secondary | ICD-10-CM | POA: Diagnosis not present

## 2021-05-28 ENCOUNTER — Other Ambulatory Visit (HOSPITAL_COMMUNITY): Payer: Self-pay | Admitting: Psychiatry

## 2021-05-28 ENCOUNTER — Encounter: Payer: Self-pay | Admitting: Orthopedic Surgery

## 2021-05-28 ENCOUNTER — Ambulatory Visit (INDEPENDENT_AMBULATORY_CARE_PROVIDER_SITE_OTHER): Payer: Medicaid Other | Admitting: Orthopedic Surgery

## 2021-05-28 ENCOUNTER — Ambulatory Visit: Payer: Medicaid Other

## 2021-05-28 ENCOUNTER — Telehealth (HOSPITAL_COMMUNITY): Payer: Medicaid Other | Admitting: Psychiatry

## 2021-05-28 ENCOUNTER — Encounter (HOSPITAL_COMMUNITY): Payer: Self-pay | Admitting: Hematology

## 2021-05-28 ENCOUNTER — Other Ambulatory Visit: Payer: Self-pay

## 2021-05-28 ENCOUNTER — Telehealth (HOSPITAL_COMMUNITY): Payer: Self-pay | Admitting: Psychiatry

## 2021-05-28 VITALS — BP 154/78 | HR 57 | Ht 70.0 in | Wt 268.0 lb

## 2021-05-28 DIAGNOSIS — M25461 Effusion, right knee: Secondary | ICD-10-CM

## 2021-05-28 DIAGNOSIS — M25561 Pain in right knee: Secondary | ICD-10-CM

## 2021-05-28 DIAGNOSIS — M797 Fibromyalgia: Secondary | ICD-10-CM | POA: Diagnosis not present

## 2021-05-28 DIAGNOSIS — G8929 Other chronic pain: Secondary | ICD-10-CM

## 2021-05-28 MED ORDER — FLUOXETINE HCL 40 MG PO CAPS: ORAL_CAPSULE | ORAL | 2 refills | Status: DC

## 2021-05-28 MED ORDER — ALPRAZOLAM 1 MG PO TABS: 1.0000 mg | ORAL_TABLET | Freq: Two times a day (BID) | ORAL | 2 refills | Status: DC | PRN

## 2021-05-28 MED ORDER — AMPHETAMINE-DEXTROAMPHETAMINE 30 MG PO TABS: 30.0000 mg | ORAL_TABLET | Freq: Two times a day (BID) | ORAL | 0 refills | Status: DC

## 2021-05-28 NOTE — Telephone Encounter (Signed)
sent 

## 2021-05-28 NOTE — Progress Notes (Addendum)
Established patient new problem   Chief Complaint  Patient presents with   Knee Pain    Right knee painful for about a month, medial states no injury     55 year old female had successful arthroscopy left knee is on chronic warfarin therapy presents with acute onset of right knee pain about a month ago.  She says she started having medial knee pain no history of trauma noted swelling but since she made the phone call to get the appointment the pain has gotten much better.  Initially she could not weight-bear and now she is walking without assistive device or limp   Body mass index is 38.45 kg/m.  BP (!) 154/78   Pulse (!) 57   Ht '5\' 10"'$  (1.778 m)   Wt 268 lb (121.6 kg)   LMP  (LMP Unknown)   BMI 38.45 kg/m   Past Medical History:  Diagnosis Date   ADD (attention deficit disorder)    Anxiety    Back pain    Bilateral swelling of feet    Bipolar 1 disorder (HCC)    Bipolar 1 disorder (HCC)    Bipolar disorder (HCC)    Chewing difficulty    Chronic fatigue syndrome    Chronic kidney disease    Stage 3 kidney disease;dx by Dr. Sinda Du.    Constipation    Depression    Diabetes mellitus without complication (HCC)    diet controlled   Dyspnea    with exertion   GERD (gastroesophageal reflux disease)    Headache    migraines   High cholesterol    History of blood clots    History of DVT (deep vein thrombosis)    left leg   Hypertension    states under control with meds., has been on med. x 2 years   Hypothyroidism    Joint pain    Neuropathy    Obsessive-compulsive disorder    Peripheral vascular disease ([place])    Prediabetes    Respiratory failure requiring intubation (HCC)    Restless leg syndrome    Shortness of breath    Sleep apnea    Thrombocytopenia ([place]) 09/15/2019   Trigger thumb of left hand 01/2018   Trigger thumb of right hand     Physical Exam  Constitutional: Development normal, nutrition normal, body habitus Body mass index is 38.45  kg/m.  Mental status oriented x3 mood and affect no depression Cardiovascular pulses and temperature normal   Gait NORMAL   Musculoskeletal:  Inspection: Right knee tenderness over the medial joint line small effusion Range of motion: 0 to 125 degrees Stability: Normal Muscle strength and tone: Excellent  Skin warm dry and intact no erythema  Neurological sensation normal  Assessment and plan:  X-rays are negative except for small effusion  Differential diagnosis:  Encounter Diagnoses  Name Primary?   Acute pain of right knee Yes   Effusion, right knee     Spontaneous effusion from the warfarin Insufficiency fracture tibia Osteoarthritis Medial meniscus  Patient will be advised to make a 4-week appointment but cancel it if the knee has returned to normal.

## 2021-05-28 NOTE — Telephone Encounter (Signed)
Patient called in advising needs refills

## 2021-05-29 ENCOUNTER — Telehealth (INDEPENDENT_AMBULATORY_CARE_PROVIDER_SITE_OTHER): Payer: Medicaid Other | Admitting: Psychiatry

## 2021-05-29 ENCOUNTER — Encounter (HOSPITAL_COMMUNITY): Payer: Self-pay | Admitting: Psychiatry

## 2021-05-29 ENCOUNTER — Encounter (HOSPITAL_COMMUNITY): Payer: Self-pay | Admitting: Hematology

## 2021-05-29 DIAGNOSIS — F900 Attention-deficit hyperactivity disorder, predominantly inattentive type: Secondary | ICD-10-CM

## 2021-05-29 DIAGNOSIS — M797 Fibromyalgia: Secondary | ICD-10-CM | POA: Diagnosis not present

## 2021-05-29 DIAGNOSIS — F3130 Bipolar disorder, current episode depressed, mild or moderate severity, unspecified: Secondary | ICD-10-CM | POA: Diagnosis not present

## 2021-05-29 MED ORDER — AMPHETAMINE-DEXTROAMPHETAMINE 30 MG PO TABS: 30.0000 mg | ORAL_TABLET | Freq: Two times a day (BID) | ORAL | 0 refills | Status: DC

## 2021-05-29 MED ORDER — AMPHETAMINE-DEXTROAMPHETAMINE 30 MG PO TABS
30.0000 mg | ORAL_TABLET | Freq: Two times a day (BID) | ORAL | 0 refills | Status: DC
Start: 2021-05-29 — End: 2021-06-27

## 2021-05-29 NOTE — Progress Notes (Addendum)
Virtual Visit via Video Note  I connected with Jane [name] on 06/01/21 at  1:20 PM EDT by a video enabled telemedicine application and verified that I am speaking with the correct person using two identifiers.  Location: Patient: home Provider:home office   I discussed the limitations of evaluation and management by telemedicine and the availability of in person appointments. The patient expressed understanding and agreed to proceed.      I discussed the assessment and treatment plan with the patient. The patient was provided an opportunity to ask questions and all were answered. The patient agreed with the plan and demonstrated an understanding of the instructions.   The patient was advised to call back or seek an in-person evaluation if the symptoms worsen or if the condition fails to improve as anticipated.  I provided 15 minutes of non-face-to-face time during this encounter.   [name], MD  [facility] MD/PA/NP OP Progress Note  05/29/2021 1:23 PM Jane [name]  MRN:  [id number]  Chief Complaint:  Chief Complaint   Anxiety; ADHD; Follow-up    HPI: This patient is a 55 year-old divorced white female who lives alone in [place]. She is on disability due to bipolar disorder.   The patient states that she became very depressed when her mother died in 01-25-00 of a brain aneurysm. She started getting. Sad but eventually became manic and develop mood swings and agitated behavior. She's been on numerous medications over the years. In 01-25-1988 she also had postpartum depression. For many years she was on lithium but it caused a bad taste in her mouth and her doctor was worried it might be starting to affect her renal function so was discontinued. She also thinks that it stopped working after a number of years  The patient returns for follow-up after 3 months.  She states that for the most part she is doing well.  She does not report any new health issues.  Her mood has been stable.  She denies  significant anxiety depression or manic symptoms.  She sleeps okay but has to get up multiple times to urinate and she is going to discuss this with her primary doctor.  The Adderall continues to help her with focus.  She denies significant temper outbursts or difficulties getting along with others.   Visit Diagnosis:    ICD-10-CM   1. Bipolar I disorder, most recent episode depressed ([place])  F31.30     2. Attention deficit hyperactivity disorder (ADHD), predominantly inattentive type  F90.0       Past Psychiatric History: Long-term outpatient treatment  Past Medical History:  Past Medical History:  Diagnosis Date   ADD (attention deficit disorder)    Anxiety    Back pain    Bilateral swelling of feet    Bipolar 1 disorder (HCC)    Bipolar 1 disorder (HCC)    Bipolar disorder (HCC)    Chewing difficulty    Chronic fatigue syndrome    Chronic kidney disease    Stage 3 kidney disease;dx by Dr. Sinda Du.    Constipation    Depression    Diabetes mellitus without complication (HCC)    diet controlled   Dyspnea    with exertion   GERD (gastroesophageal reflux disease)    Headache    migraines   High cholesterol    History of blood clots    History of DVT (deep vein thrombosis)    left leg   Hypertension    states under control with  meds., has been on med. x 2 years   Hypothyroidism    Joint pain    Neuropathy    Obsessive-compulsive disorder    Peripheral vascular disease ([place])    Prediabetes    Respiratory failure requiring intubation ([place])    Restless leg syndrome    Shortness of breath    Sleep apnea    Thrombocytopenia ([place]) 09/15/2019   Trigger thumb of left hand 01/2018   Trigger thumb of right hand     Past Surgical History:  Procedure Laterality Date   ABDOMINAL HYSTERECTOMY  06/2016   complete   APPLICATION OF WOUND VAC Left 04/27/2019   Procedure: APPLICATION OF WOUND VAC LEFT GROIN;  Surgeon: Waynetta Sandy, MD;  Location: [place];   Service: Vascular;  Laterality: Left;   AV FISTULA PLACEMENT Left 11/24/2018   Procedure: ARTERIOVENOUS (AV) FISTULA CREATION LEFT SFA TO LEFT FEMORAL VEIN;  Surgeon: Waynetta Sandy, MD;  Location: [place];  Service: Vascular;  Laterality: Left;   BACK SURGERY     CHOLECYSTECTOMY     COLONOSCOPY WITH PROPOFOL N/A 11/22/2019   Procedure: COLONOSCOPY WITH PROPOFOL;  Surgeon: [name], MD;  Location: [facility] ENDOSCOPY;  Service: Gastroenterology;  Laterality: N/A;   FEMORAL ARTERY EXPLORATION  03/30/2019   Procedure: Left Common Femoral Artery and Vein Exploration;  Surgeon: Waynetta Sandy, MD;  Location: [place];  Service: Vascular;;   FEMORAL-FEMORAL BYPASS GRAFT Left 11/24/2018   Procedure: BYPASS GRAFT FEMORAL-FEMORAL VENOUS LEFT TO RIGHT PALMA PROCEDURE USING CRYOVEIN;  Surgeon: Waynetta Sandy, MD;  Location: [place];  Service: Vascular;  Laterality: Left;   GROIN DEBRIDEMENT Left 04/27/2019   Procedure: GROIN DEBRIDEMENT;  Surgeon: Waynetta Sandy, MD;  Location: [place];  Service: Vascular;  Laterality: Left;   INSERTION OF ILIAC STENT  03/30/2019   Procedure: Stent of left common, external iliac veins and left common femoral vein;  Surgeon: Waynetta Sandy, MD;  Location: [place];  Service: Vascular;;   KNEE ARTHROSCOPY WITH MENISCAL REPAIR Left 11/14/2020   Procedure: LEFT KNEE ARTHROSCOPY WITH PARTIAL MEDIAL MENISCECTOMY;  Surgeon: Carole Civil, MD;  Location: [facility];  Service: Orthopedics;  Laterality: Left;   LOWER EXTREMITY VENOGRAPHY N/A 08/17/2018   Procedure: LOWER EXTREMITY VENOGRAPHY - Central Venogram;  Surgeon: Waynetta Sandy, MD;  Location: [place] [facility];  Service: Cardiovascular;  Laterality: N/A;   LOWER EXTREMITY VENOGRAPHY Bilateral 03/09/2019   Procedure: LOWER EXTREMITY VENOGRAPHY;  Surgeon: Waynetta Sandy, MD;  Location: [place] [facility];  Service: Cardiovascular;  Laterality: Bilateral;   LOWER  EXTREMITY VENOGRAPHY Left 08/16/2019   Procedure: LOWER EXTREMITY VENOGRAPHY;  Surgeon: Waynetta Sandy, MD;  Location: [place] [facility];  Service: Cardiovascular;  Laterality: Left;   LUMBAR FUSION  11/21/2000   L5-S1   LUMBAR SPINE SURGERY     x 2 others   PATCH ANGIOPLASTY Left 03/30/2019   Procedure: Patch Angioplasty of the Left Common Femoral Vein using Venosure Biologic patch;  Surgeon: Waynetta Sandy, MD;  Location: [facility];  Service: Vascular;  Laterality: Left;   PERIPHERAL VASCULAR INTERVENTION Left 08/16/2019   Procedure: PERIPHERAL VASCULAR INTERVENTION;  Surgeon: Waynetta Sandy, MD;  Location: [place] [facility];  Service: Cardiovascular;  Laterality: Left;  common femoral/femoral vein stent   TRIGGER FINGER RELEASE Right 12/01/2017   Procedure: RELEASE TRIGGER FINGER/A-1 PULLEY RIGHT THUMB;  Surgeon: Leanora Cover, MD;  Location: [facility];  Service: Orthopedics;  Laterality: Right;  TRIGGER FINGER RELEASE Left 01/26/2018   Procedure: LEFT TRIGGER THUMB RELEASE;  Surgeon: Leanora Cover, MD;  Location: [facility];  Service: Orthopedics;  Laterality: Left;   ULTRASOUND GUIDANCE FOR VASCULAR ACCESS Right 03/30/2019   Procedure: Ultrasound-guided cannulation right internal jugular vein;  Surgeon: Waynetta Sandy, MD;  Location: [place] [place];  Service: Vascular;  Laterality: Right;    Family Psychiatric History: see below  Family History:  Family History  Problem Relation Age of Onset   Heart disease Mother    Hyperlipidemia Mother    Hypertension Mother    Bipolar disorder Mother    Stroke Mother    Depression Mother    Sleep apnea Mother    Obesity Mother    Diabetes Father    Heart disease Father    Hyperlipidemia Father    Hypertension Father    Sleep apnea Father    Obesity Father    Drug abuse Daughter    ADD / ADHD Daughter    Drug abuse Daughter    Anxiety disorder Daughter    Bipolar disorder  Daughter    Hypertension Sister    Hypertension Brother    Hyperlipidemia Brother    Heart disease Brother    Bipolar disorder Maternal Aunt    Suicidality Maternal Aunt     Social History:  Social History   Socioeconomic History   Marital status: Divorced    Spouse name: Not on file   Number of children: 3   Years of education: Not on file   Highest education level: Not on file  Occupational History   Not on file  Tobacco Use   Smoking status: Never   Smokeless tobacco: Never  Vaping Use   Vaping Use: Never used  Substance and Sexual Activity   Alcohol use: No   Drug use: No   Sexual activity: Not Currently    Partners: Male    Birth control/protection: Surgical  Other Topics Concern   Not on file  Social History Narrative   Not on file   Social Determinants of Health   Financial Resource Strain: High Risk   Difficulty of Paying Living Expenses: Hard  Food Insecurity: Food Insecurity Present   Worried About Running Out of Food in the Last Year: Sometimes true   Ran Out of Food in the Last Year: Never true  Transportation Needs: No Transportation Needs   Lack of Transportation (Medical): No   Lack of Transportation (Non-Medical): No  Physical Activity: Inactive   Days of Exercise per Week: 0 days   Minutes of Exercise per Session: 0 min  Stress: Stress Concern Present   Feeling of Stress : Very much  Social Connections: Socially Isolated   Frequency of Communication with Friends and Family: More than three times a week   Frequency of Social Gatherings with Friends and Family: Three times a week   Attends Religious Services: Never   Active Member of Clubs or Organizations: No   Attends Archivist Meetings: Never   Marital Status: Divorced    Allergies:  Allergies  Allergen Reactions   Aripiprazole Other (See Comments)    BECOMES  VIOLENT    Seroquel [Quetiapine Fumarate] Other (See Comments)    BECOMES VIOLENT   Chlorpromazine Other (See  Comments)    SEVERE ANXIETY    Gabapentin Other (See Comments)    NIGHTMARES     Metabolic Disorder Labs: Lab Results  Component Value Date   HGBA1C 6.3 (H) 03/22/2021  MPG 134.11 03/26/2019   No results found for: PROLACTIN Lab Results  Component Value Date   CHOL 203 (H) 03/29/2020   TRIG 296 (H) 03/29/2020   HDL 37 (L) 03/29/2020   CHOLHDL 5.0 (H) 10/25/2019   LDLCALC 114 (H) 03/29/2020   LDLCALC 127 (H) 10/25/2019   Lab Results  Component Value Date   TSH 0.107 (L) 05/18/2020   TSH 0.275 (L) 03/29/2020    Therapeutic Level Labs: No results found for: LITHIUM Lab Results  Component Value Date   VALPROATE 61.2 03/23/2014   No components found for:  CBMZ  Current Medications: Current Outpatient Medications  Medication Sig Dispense Refill   amphetamine-dextroamphetamine (ADDERALL) 30 MG tablet Take 1 tablet by mouth 2 (two) times daily. 60 tablet 0   amphetamine-dextroamphetamine (ADDERALL) 30 MG tablet Take 1 tablet by mouth 2 (two) times daily. 60 tablet 0   ALPRAZolam (XANAX) 1 MG tablet Take 1 tablet (1 mg total) by mouth 2 (two) times daily as needed for anxiety. 60 tablet 2   amphetamine-dextroamphetamine (ADDERALL) 30 MG tablet Take 1 tablet by mouth 2 (two) times daily. 60 tablet 0   aspirin EC 81 MG tablet Take 81 mg by mouth daily.     atorvastatin (LIPITOR) 20 MG tablet TAKE 1 TABLET BY MOUTH DAILY (Patient taking differently: Take 20 mg by mouth daily.) 90 tablet 0   cetirizine (ZYRTEC) 10 MG tablet Take 1 tablet by mouth every morning.     cycloSPORINE (RESTASIS) 0.05 % ophthalmic emulsion Place 1 drop into both eyes 2 (two) times daily.     FLUoxetine (PROZAC) 40 MG capsule TAKE 1 TABLET BY MOUTH DAILY 90 capsule 2   GARLIC PO Take [id number] mg by mouth daily.     hydrochlorothiazide (HYDRODIURIL) 25 MG tablet TAKE 1 TABLET BY MOUTH DAILY (Patient taking differently: Take 25 mg by mouth daily.) 90 tablet 0   levothyroxine (SYNTHROID) 125 MCG tablet Take  125 mcg by mouth daily before breakfast.     LINZESS 145 MCG CAPS capsule TAKE ONE CAPSULE BY MOUTH DAILY (Patient taking differently: Take 145 mcg by mouth daily as needed.) 90 capsule 3   methocarbamol (ROBAXIN) 750 MG tablet Take 1 tablet (750 mg total) by mouth 2 (two) times daily as needed for muscle spasms. 60 tablet 2   Multiple Vitamin (MULTIVITAMIN) capsule Take 1 capsule by mouth daily.     Olopatadine HCl 0.2 % SOLN Place 1 drop into both eyes daily.     Omega-3 Fatty Acids (OMEGA-3 FISH OIL PO) Take 600 mg by mouth daily.     pantoprazole (PROTONIX) 40 MG tablet TAKE 1 TABLET BY MOUTH DAILY (Patient taking differently: Take 40 mg by mouth daily.) 90 tablet 3   potassium chloride (KLOR-CON) 10 MEQ tablet TAKE 1 TABLET BY MOUTH THREE TIMES DAILY 90 tablet 0   pregabalin (LYRICA) 100 MG capsule Take 100 mg by mouth 4 (four) times daily.     rOPINIRole (REQUIP) 2 MG tablet TAKE 1 TABLET BY MOUTH TWICE DAILY (Patient taking differently: Take 2 mg by mouth 2 (two) times daily.) 180 tablet 3   triamcinolone cream (KENALOG) 0.1 % Apply topically 2 (two) times daily.     warfarin (COUMADIN) 5 MG tablet TAKE 1 AND 1/2 TABLETS BY MOUTH ON TUEDAYS AND THURSDAYS, THEN TAKE 1 TABLET DAILY ON ALL OTHER DAYS (Patient taking differently: See admin instructions. Take 2.5 mg on Mon and Wednesdays and 5 mg tablet on the rest of the days)  60 tablet 1   No current facility-administered medications for this visit.     Musculoskeletal: Strength & Muscle Tone: within normal limits Gait & Station: normal Patient leans: N/A  Psychiatric Specialty Exam: Review of Systems  There were no vitals taken for this visit.There is no height or weight on file to calculate BMI.  General Appearance: Casual and Fairly Groomed  Eye Contact:  Good  Speech:  Clear and Coherent  Volume:  Normal  Mood:  Euthymic  Affect:  Appropriate and Congruent  Thought Process:  Goal Directed  Orientation:  Full (Time, Place, and  Person)  Thought Content: WDL   Suicidal Thoughts:  No  Homicidal Thoughts:  No  Memory:  Immediate;   Good Recent;   Good Remote;   Good  Judgement:  Good  Insight:  Good  Psychomotor Activity:  Normal  Concentration:  Concentration: Good and Attention Span: Good  Recall:  Good  Fund of Knowledge: Good  Language: Good  Akathisia:  No  Handed:  Right  AIMS (if indicated): not done  Assets:  Communication Skills Desire for Improvement Resilience Social Support  ADL's:  Intact  Cognition: WNL  Sleep:  Fair   Screenings: PHQ2-9    Flowsheet Row Video Visit from 05/29/2021 in [facility] Visit from 04/10/2021 in [facility] Video Visit from 02/26/2021 in [facility] Office Visit from 12/26/2020 in [facility] Office Visit from 10/24/2020 in [facility]  PHQ-2 Total Score 0 0 0 0 2  PHQ-9 Total Score -- -- -- -- 8      Flowsheet Row Video Visit from 05/29/2021 in [facility] from 04/12/2021 in [facility] Video Visit from 02/26/2021 in [place] [facility] No Risk No Risk No Risk        Assessment and Plan: This patient is a 55 year old female with a history of depression anxiety and ADD.  She continues to do well.  She will continue Prozac 40 mg daily for depression, Xanax 1 mg twice daily as needed for anxiety and Adderall 30 mg twice daily for ADD.  She will return to see me in 3 months   [name], MD 05/29/2021, 1:23 PM

## 2021-05-29 NOTE — Progress Notes (Signed)
Chief Complaint:   OBESITY Jane [name] is here to discuss her progress with her obesity treatment plan along with follow-up of her obesity related diagnoses. Jane [name] is on the Category 4 Plan and states she is following her eating plan approximately 65% of the time. Thu states she is not currently exercising.  Today's visit was #: 18 Starting weight: 283 lbs Starting date: 03/29/2020 Today's weight: 261 lbs Today's date: 05/24/2021 Total lbs lost to date: 22 Total lbs lost since last in-office visit: 7  Interim History: Jane [name] has been more attentive to food choices and is still trying to work on getting more protein in. Snack wise, she is eating 100 calories or below per portion. She is eating pretzels or chips but in individual sized bags. She has 3 birthday parties coming up and then floating down the river in [place].  Subjective:   1. Other specified hypothyroidism She is on 125 mcg Synthroid. Pt just had TSH checked last week and result was 2.735.  2. Type 2 diabetes mellitus with hyperglycemia, without long-term current use of insulin ([place]) She is not on Metformin or any other medication. Her last [name] was 6.3.  Assessment/Plan:   1. Other specified hypothyroidism Patient with long-standing hypothyroidism, on levothyroxine therapy. She appears euthyroid. Orders and follow up as documented in patient record. Continue Synthroid as directed. No refill needed.  Counseling Good thyroid control is important for overall health. Supratherapeutic thyroid levels are dangerous and will not improve weight loss results. The correct way to take levothyroxine is fasting, with water, separated by at least 30 minutes from breakfast, and separated by more than 4 hours from calcium, iron, multivitamins, acid reflux medications (PPIs).   2. Type 2 diabetes mellitus with hyperglycemia, without long-term current use of insulin (HCC) Good blood sugar control is important to decrease the likelihood of  diabetic complications such as nephropathy, neuropathy, limb loss, blindness, coronary artery disease, and death. Intensive lifestyle modification including diet, exercise and weight loss are the first line of treatment for diabetes.   3. Obesity Current BMI 35  Jane [name] is currently in the action stage of change. As such, her goal is to continue with weight loss efforts. She has agreed to the Category 4 Plan.   Exercise goals:  As is  Behavioral modification strategies: increasing lean protein intake, meal planning and cooking strategies, keeping healthy foods in the home, and planning for success.  Jane [name] has agreed to follow-up with our clinic in 2-3 weeks. She was informed of the importance of frequent follow-up visits to maximize her success with intensive lifestyle modifications for her multiple health conditions.   Objective:   Blood pressure 121/77, pulse (!) 57, temperature 98.5 F (36.9 C), height '5\' 10"'$  (1.778 m), weight 261 lb (118.4 kg), SpO2 96 %. Body mass index is 37.45 kg/m.  General: Cooperative, alert, well developed, in no acute distress. HEENT: Conjunctivae and lids unremarkable. Cardiovascular: Regular rhythm.  Lungs: Normal work of breathing. Neurologic: No focal deficits.   Lab Results  Component Value Date   CREATININE 1.23 (H) 04/12/2021   BUN 33 (H) 04/12/2021   NA 140 04/12/2021   K 3.8 04/12/2021   CL 101 04/12/2021   CO2 32 04/12/2021   Lab Results  Component Value Date   ALT 20 04/12/2021   AST 17 04/12/2021   ALKPHOS 95 04/12/2021   BILITOT 0.6 04/12/2021   Lab Results  Component Value Date   HGBA1C 6.3 (H) 03/22/2021   HGBA1C 5.8 (  A) 10/24/2020   HGBA1C 6.2 (H) 03/29/2020   HGBA1C 6.3 (H) 10/25/2019   HGBA1C 6.3 (H) 03/26/2019   Lab Results  Component Value Date   INSULIN 21.5 03/29/2020   Lab Results  Component Value Date   TSH 0.107 (L) 05/18/2020   Lab Results  Component Value Date   CHOL 203 (H) 03/29/2020   HDL 37 (L)  03/29/2020   LDLCALC 114 (H) 03/29/2020   TRIG 296 (H) 03/29/2020   CHOLHDL 5.0 (H) 10/25/2019   Lab Results  Component Value Date   VD25OH 50.60 01/01/2021   VD25OH 45.30 10/17/2020   VD25OH 37.20 07/18/2020   Lab Results  Component Value Date   WBC 7.2 04/12/2021   HGB 13.6 04/12/2021   HCT 41.1 04/12/2021   MCV 93.4 04/12/2021   PLT 146 (L) 04/12/2021   Lab Results  Component Value Date   IRON 49 04/12/2021   TIBC 304 04/12/2021   FERRITIN 82 04/12/2021    Attestation Statements:   Reviewed by clinician on day of visit: allergies, medications, problem list, medical history, surgical history, family history, social history, and previous encounter notes.  Time spent on visit including pre-visit chart review and post-visit care and charting was 12 minutes.   Coral Ceo, CMA, am acting as transcriptionist for Coralie Common, MD. I have reviewed the above documentation for accuracy and completeness, and I agree with the above. - Coralie Common, MD

## 2021-05-30 ENCOUNTER — Encounter (HOSPITAL_COMMUNITY): Payer: Self-pay | Admitting: Hematology

## 2021-05-30 DIAGNOSIS — M797 Fibromyalgia: Secondary | ICD-10-CM | POA: Diagnosis not present

## 2021-05-30 NOTE — Telephone Encounter (Signed)
Noted. Called patient and LMOM informing her script is at the pharmacy

## 2021-05-31 ENCOUNTER — Ambulatory Visit (INDEPENDENT_AMBULATORY_CARE_PROVIDER_SITE_OTHER): Payer: Medicaid Other | Admitting: Family Medicine

## 2021-05-31 ENCOUNTER — Other Ambulatory Visit: Payer: Self-pay

## 2021-05-31 DIAGNOSIS — N3946 Mixed incontinence: Secondary | ICD-10-CM | POA: Diagnosis not present

## 2021-05-31 DIAGNOSIS — R350 Frequency of micturition: Secondary | ICD-10-CM

## 2021-05-31 DIAGNOSIS — R351 Nocturia: Secondary | ICD-10-CM

## 2021-05-31 DIAGNOSIS — M797 Fibromyalgia: Secondary | ICD-10-CM | POA: Diagnosis not present

## 2021-05-31 DIAGNOSIS — N3944 Nocturnal enuresis: Secondary | ICD-10-CM

## 2021-05-31 LAB — POCT URINALYSIS DIPSTICK
Bilirubin, UA: NEGATIVE
Glucose, UA: NEGATIVE
Ketones, UA: NEGATIVE
Leukocytes, UA: NEGATIVE
Nitrite, UA: NEGATIVE
Protein, UA: NEGATIVE
Spec Grav, UA: 1.02 (ref 1.010–1.025)
Urobilinogen, UA: 0.2 E.U./dL
pH, UA: 6.5 (ref 5.0–8.0)

## 2021-05-31 MED ORDER — OXYBUTYNIN CHLORIDE ER 10 MG PO TB24: 10.0000 mg | ORAL_TABLET | Freq: Every day | ORAL | 2 refills | Status: DC

## 2021-05-31 NOTE — Progress Notes (Signed)
Established patient visit   Patient: Jane [name]   DOB: Feb 03, 1966   55 y.o. Female  MRN: [id number] Visit Date: 05/31/2021  Today's healthcare provider: Lavon Paganini, MD   Chief Complaint  Patient presents with   Urinary Frequency    Subjective    Urinary Frequency  Associated symptoms include frequency and urgency. Pertinent negatives include no chills, flank pain, hematuria, nausea or vomiting.    Patient is a 55 year old female who presents for enuresis and frequency of urination.  She states she does not have back, side or pelvic pain related to this.  Urinary incontinence  She reports an increase in the urge to pee and she cant make it to the restroom. She reports having to wear double pads to prevent leakage. Further she is unaware when her bladder is full and this issue has been going on for about 5 months. She denies symptoms of infection and she has always had blood in her urine since a teenager. This is related to familial history. She had a full hysterectomy in 2017 and or her kidney disease and she is unsure if these could be associated with her symptoms. She denies taking oxybutynin however she is amenable to trying this medication.     Medications: Outpatient Medications Prior to Visit  Medication Sig   ALPRAZolam (XANAX) 1 MG tablet Take 1 tablet (1 mg total) by mouth 2 (two) times daily as needed for anxiety.   amphetamine-dextroamphetamine (ADDERALL) 30 MG tablet Take 1 tablet by mouth 2 (two) times daily.   amphetamine-dextroamphetamine (ADDERALL) 30 MG tablet Take 1 tablet by mouth 2 (two) times daily.   amphetamine-dextroamphetamine (ADDERALL) 30 MG tablet Take 1 tablet by mouth 2 (two) times daily.   aspirin EC 81 MG tablet Take 81 mg by mouth daily.   atorvastatin (LIPITOR) 20 MG tablet TAKE 1 TABLET BY MOUTH DAILY (Patient taking differently: Take 20 mg by mouth daily.)   cetirizine (ZYRTEC) 10 MG tablet Take 1 tablet by mouth every morning.    cycloSPORINE (RESTASIS) 0.05 % ophthalmic emulsion Place 1 drop into both eyes 2 (two) times daily.   FLUoxetine (PROZAC) 40 MG capsule TAKE 1 TABLET BY MOUTH DAILY   GARLIC PO Take [id number] mg by mouth daily.   hydrochlorothiazide (HYDRODIURIL) 25 MG tablet TAKE 1 TABLET BY MOUTH DAILY (Patient taking differently: Take 25 mg by mouth daily.)   HYDROcodone-acetaminophen (NORCO) 10-325 MG tablet Take 1 tablet by mouth every 6 (six) hours as needed.   levothyroxine (SYNTHROID) 125 MCG tablet Take 125 mcg by mouth daily before breakfast.   LINZESS 145 MCG CAPS capsule TAKE ONE CAPSULE BY MOUTH DAILY (Patient taking differently: Take 145 mcg by mouth daily as needed.)   methocarbamol (ROBAXIN) 750 MG tablet Take 1 tablet (750 mg total) by mouth 2 (two) times daily as needed for muscle spasms.   Multiple Vitamin (MULTIVITAMIN) capsule Take 1 capsule by mouth daily.   Olopatadine HCl 0.2 % SOLN Place 1 drop into both eyes daily.   Omega-3 Fatty Acids (OMEGA-3 FISH OIL PO) Take 600 mg by mouth daily.   pantoprazole (PROTONIX) 40 MG tablet TAKE 1 TABLET BY MOUTH DAILY (Patient taking differently: Take 40 mg by mouth daily.)   potassium chloride (KLOR-CON) 10 MEQ tablet TAKE 1 TABLET BY MOUTH THREE TIMES DAILY   pregabalin (LYRICA) 100 MG capsule Take 100 mg by mouth 4 (four) times daily.   rOPINIRole (REQUIP) 2 MG tablet TAKE 1 TABLET BY  MOUTH TWICE DAILY (Patient taking differently: Take 2 mg by mouth 2 (two) times daily.)   warfarin (COUMADIN) 5 MG tablet TAKE 1 AND 1/2 TABLETS BY MOUTH ON TUEDAYS AND THURSDAYS, THEN TAKE 1 TABLET DAILY ON ALL OTHER DAYS (Patient taking differently: See admin instructions. Take 2.5 mg on Mon and Wednesdays and 5 mg tablet on the rest of the days)   triamcinolone cream (KENALOG) 0.1 % Apply topically 2 (two) times daily.   No facility-administered medications prior to visit.   Review of Systems  Constitutional:  Negative for chills, fatigue and fever.  HENT:  Negative  for ear pain, sinus pressure, sinus pain and sore throat.   Eyes:  Negative for pain and visual disturbance.  Respiratory:  Negative for cough, chest tightness, shortness of breath and wheezing.   Cardiovascular:  Negative for chest pain, palpitations and leg swelling.  Gastrointestinal:  Negative for abdominal pain, blood in stool, diarrhea, nausea and vomiting.  Genitourinary:  Positive for enuresis, frequency and urgency. Negative for difficulty urinating, dysuria, flank pain, hematuria and pelvic pain.  Musculoskeletal:  Negative for back pain and myalgias.  Neurological:  Negative for dizziness, weakness, light-headedness, numbness and headaches.      Objective    LMP  (LMP Unknown)     Physical Exam Vitals reviewed.  Constitutional:      General: She is not in acute distress.    Appearance: Normal appearance. She is well-developed. She is not diaphoretic.  HENT:     Head: Normocephalic and atraumatic.  Eyes:     General: No scleral icterus.    Conjunctiva/sclera: Conjunctivae normal.  Neck:     Thyroid: No thyromegaly.  Cardiovascular:     Rate and Rhythm: Normal rate and regular rhythm.     Pulses: Normal pulses.     Heart sounds: Normal heart sounds. No murmur heard. Pulmonary:     Effort: Pulmonary effort is normal. No respiratory distress.     Breath sounds: Normal breath sounds. No wheezing, rhonchi or rales.  Musculoskeletal:     Cervical back: Neck supple.     Right lower leg: No edema.     Left lower leg: No edema.  Lymphadenopathy:     Cervical: No cervical adenopathy.  Skin:    General: Skin is warm and dry.     Findings: No rash.  Neurological:     Mental Status: She is alert and oriented to person, place, and time. Mental status is at baseline.  Psychiatric:        Mood and Affect: Mood normal.        Behavior: Behavior normal.    Results for orders placed or performed in visit on 05/31/21  POCT urinalysis dipstick  Result Value Ref Range   Color,  UA yellow    Clarity, UA clear    Glucose, UA Negative Negative   Bilirubin, UA neg    Ketones, UA neg    Spec Grav, UA 1.020 1.010 - 1.025   Blood, UA 3+    pH, UA 6.5 5.0 - 8.0   Protein, UA Negative Negative   Urobilinogen, UA 0.2 0.2 or 1.0 E.U./dL   Nitrite, UA neg    Leukocytes, UA Negative Negative   Appearance     Odor      Assessment & Plan     Problem List Items Addressed This Visit   None Visit Diagnoses     Urinary frequency    -  Primary   Relevant Orders  Ambulatory referral to Urology   POCT urinalysis dipstick (Completed)   Nocturnal enuresis       Relevant Medications   oxybutynin (DITROPAN XL) 10 MG 24 hr tablet   Other Relevant Orders   Ambulatory referral to Urology   Nocturia       Relevant Orders   Ambulatory referral to Urology   POCT urinalysis dipstick (Completed)   Mixed stress and urge urinary incontinence       Relevant Medications   oxybutynin (DITROPAN XL) 10 MG 24 hr tablet   Other Relevant Orders   Ambulatory referral to Urology     - ongoing for several months - constellation of symptoms consistent with OAB/bladder spasms - Trial of Oxybutinin - referral to Urology for further eval and management.   No follow-ups on file.      I,Essence Turner,acting as a Education administrator for Lavon Paganini, MD.,have documented all relevant documentation on the behalf of Lavon Paganini, MD,as directed by  Lavon Paganini, MD while in the presence of Lavon Paganini, MD.  I, Lavon Paganini, MD, have reviewed all documentation for this visit. The documentation on 06/01/21 for the exam, diagnosis, procedures, and orders are all accurate and complete.   Oreste Majeed, Dionne Bucy, MD, MPH [facility]

## 2021-06-01 DIAGNOSIS — M797 Fibromyalgia: Secondary | ICD-10-CM | POA: Diagnosis not present

## 2021-06-02 DIAGNOSIS — M797 Fibromyalgia: Secondary | ICD-10-CM | POA: Diagnosis not present

## 2021-06-03 DIAGNOSIS — M797 Fibromyalgia: Secondary | ICD-10-CM | POA: Diagnosis not present

## 2021-06-04 DIAGNOSIS — M797 Fibromyalgia: Secondary | ICD-10-CM | POA: Diagnosis not present

## 2021-06-05 DIAGNOSIS — M797 Fibromyalgia: Secondary | ICD-10-CM | POA: Diagnosis not present

## 2021-06-06 ENCOUNTER — Other Ambulatory Visit: Payer: Self-pay | Admitting: Family Medicine

## 2021-06-06 DIAGNOSIS — Z7901 Long term (current) use of anticoagulants: Secondary | ICD-10-CM

## 2021-06-06 DIAGNOSIS — M797 Fibromyalgia: Secondary | ICD-10-CM | POA: Diagnosis not present

## 2021-06-07 DIAGNOSIS — M797 Fibromyalgia: Secondary | ICD-10-CM | POA: Diagnosis not present

## 2021-06-08 DIAGNOSIS — M797 Fibromyalgia: Secondary | ICD-10-CM | POA: Diagnosis not present

## 2021-06-09 DIAGNOSIS — M797 Fibromyalgia: Secondary | ICD-10-CM | POA: Diagnosis not present

## 2021-06-10 DIAGNOSIS — M797 Fibromyalgia: Secondary | ICD-10-CM | POA: Diagnosis not present

## 2021-06-11 DIAGNOSIS — M797 Fibromyalgia: Secondary | ICD-10-CM | POA: Diagnosis not present

## 2021-06-12 DIAGNOSIS — H16223 Keratoconjunctivitis sicca, not specified as Sjogren's, bilateral: Secondary | ICD-10-CM | POA: Diagnosis not present

## 2021-06-12 DIAGNOSIS — R32 Unspecified urinary incontinence: Secondary | ICD-10-CM | POA: Diagnosis not present

## 2021-06-12 DIAGNOSIS — M797 Fibromyalgia: Secondary | ICD-10-CM | POA: Diagnosis not present

## 2021-06-12 DIAGNOSIS — H40033 Anatomical narrow angle, bilateral: Secondary | ICD-10-CM | POA: Diagnosis not present

## 2021-06-13 ENCOUNTER — Other Ambulatory Visit: Payer: Self-pay

## 2021-06-13 ENCOUNTER — Ambulatory Visit (INDEPENDENT_AMBULATORY_CARE_PROVIDER_SITE_OTHER): Payer: Medicaid Other

## 2021-06-13 DIAGNOSIS — M797 Fibromyalgia: Secondary | ICD-10-CM | POA: Diagnosis not present

## 2021-06-13 DIAGNOSIS — Z86718 Personal history of other venous thrombosis and embolism: Secondary | ICD-10-CM | POA: Diagnosis not present

## 2021-06-13 LAB — POCT INR
INR: 2 (ref 2.0–3.0)
PT: 24.2

## 2021-06-13 NOTE — Patient Instructions (Signed)
Description   Continue 5mg  daily except 2.5mg  Monday and Wednesday.  Recheck in four weeks.

## 2021-06-14 ENCOUNTER — Encounter (INDEPENDENT_AMBULATORY_CARE_PROVIDER_SITE_OTHER): Payer: Self-pay | Admitting: Family Medicine

## 2021-06-14 ENCOUNTER — Ambulatory Visit (INDEPENDENT_AMBULATORY_CARE_PROVIDER_SITE_OTHER): Payer: Medicaid Other | Admitting: Family Medicine

## 2021-06-14 ENCOUNTER — Other Ambulatory Visit: Payer: Self-pay

## 2021-06-14 VITALS — BP 137/74 | HR 51 | Temp 98.6°F | Ht 70.0 in | Wt 265.0 lb

## 2021-06-14 DIAGNOSIS — M797 Fibromyalgia: Secondary | ICD-10-CM | POA: Diagnosis not present

## 2021-06-14 DIAGNOSIS — I152 Hypertension secondary to endocrine disorders: Secondary | ICD-10-CM | POA: Diagnosis not present

## 2021-06-14 DIAGNOSIS — E1159 Type 2 diabetes mellitus with other circulatory complications: Secondary | ICD-10-CM | POA: Diagnosis not present

## 2021-06-14 DIAGNOSIS — Z6841 Body Mass Index (BMI) 40.0 and over, adult: Secondary | ICD-10-CM | POA: Diagnosis not present

## 2021-06-14 DIAGNOSIS — E1165 Type 2 diabetes mellitus with hyperglycemia: Secondary | ICD-10-CM | POA: Diagnosis not present

## 2021-06-14 MED ORDER — TIRZEPATIDE 2.5 MG/0.5ML ~~LOC~~ SOAJ: 2.5000 mg | SUBCUTANEOUS | 0 refills | Status: DC

## 2021-06-14 NOTE — Patient Instructions (Signed)
Health Maintenance Due  Topic Date Due   COVID-19 Vaccine (1) Never done   OPHTHALMOLOGY EXAM  Never done   Hepatitis C Screening  Never done   Zoster Vaccines- Shingrix (1 of 2) Never done   FOOT EXAM  09/21/2020   PAP SMEAR-Modifier  10/06/2020   MAMMOGRAM  10/08/2020   Pneumococcal Vaccine 75-55 Years old (2 - PCV) 03/07/2021   URINE MICROALBUMIN  03/29/2021   INFLUENZA VACCINE  06/04/2021    Depression screen PHQ 2/9 05/31/2021 04/10/2021 12/26/2020  Decreased Interest 2 0 0  Down, Depressed, Hopeless 2 0 0  PHQ - 2 Score 4 0 0  Altered sleeping 3 - -  Tired, decreased energy 3 - -  Change in appetite 2 - -  Feeling bad or failure about yourself  1 - -  Trouble concentrating 3 - -  Moving slowly or fidgety/restless 2 - -  Suicidal thoughts 0 - -  PHQ-9 Score 18 - -  Difficult doing work/chores Not difficult at all - -  Some encounter information is confidential and restricted. Go to Review Flowsheets activity to see all data.  Some recent data might be hidden

## 2021-06-15 DIAGNOSIS — M797 Fibromyalgia: Secondary | ICD-10-CM | POA: Diagnosis not present

## 2021-06-15 NOTE — Progress Notes (Signed)
Chief Complaint:   OBESITY Jane [name] is here to discuss her progress with her obesity treatment plan along with follow-up of her obesity related diagnoses. [name] is on the Category 4 Plan and states she is following her eating plan approximately 50% of the time. [name] states she is walking for 30 minutes 7 times per week.  Today's visit was #: 71 Starting weight: 283 lbs Starting date: 03/29/2020 Today's weight: 265 lbs Today's date: 06/14/2021 Total lbs lost to date: 18 Total lbs lost since last in-office visit: 0  Interim History: Jane [name] has been doing quite a bit of physical activity. She spends quite a bit of time with her grandkids and has quite a few indulgent snacks around. Jane [name] is going out frequently with her family and recognizes that she isn't always choosing the healthiest options.  Subjective:   1. Type 2 diabetes mellitus with hyperglycemia, without long-term current use of insulin (HCC) Jane [name] has had increased carb cravings. Her last [name] had went up to 6.3.  Lab Results  Component Value Date   HGBA1C 6.3 (H) 03/22/2021   HGBA1C 5.8 (A) 10/24/2020   HGBA1C 6.2 (H) 03/29/2020   Lab Results  Component Value Date   LDLCALC 114 (H) 03/29/2020   CREATININE 1.23 (H) 04/12/2021   Lab Results  Component Value Date   INSULIN 21.5 03/29/2020   2. Hypertension associated with diabetes ([place]) Cardiovascular ROS: negative for chest pain, chest pressure, and headaches.  BP Readings from Last 3 Encounters:  06/14/21 137/74  05/28/21 (!) 154/78  05/24/21 121/77   Lab Results  Component Value Date   CREATININE 1.23 (H) 04/12/2021   CREATININE 1.26 (H) 04/12/2021   CREATININE 1.36 (H) 03/22/2021    Assessment/Plan:   1. Type 2 diabetes mellitus with hyperglycemia, without long-term current use of insulin (HCC) Jane [name] has agreed to start [facility] 2.'5mg'$ . Good blood sugar control is important to decrease the likelihood of diabetic complications such as nephropathy,  neuropathy, limb loss, blindness, coronary artery disease, and death. Intensive lifestyle modification including diet, exercise and weight loss are the first line of treatment for diabetes.   - tirzepatide [facility]) 2.5 MG/0.5ML Pen; Inject 2.5 mg into the skin once a week.  Dispense: 2 mL; Refill: 0  2. Hypertension associated with diabetes ([place]) Jane [name] agrees to continue her current medications with no refill needed. She is working on healthy weight loss and exercise to improve blood pressure control. We will watch for signs of hypotension as she continues her lifestyle modifications.  3. Obesity with current BMI of 38.1 Jane [name] is currently in the action stage of change. As such, her goal is to continue with weight loss efforts. She has agreed to the Category 4 Plan.   Exercise goals: All adults should avoid inactivity. Some physical activity is better than none, and adults who participate in any amount of physical activity gain some health benefits.  Behavioral modification strategies: increasing lean protein intake, meal planning and cooking strategies, keeping healthy foods in the home, and planning for success.  Jane [name] has agreed to follow-up with our clinic in 3 weeks. She was informed of the importance of frequent follow-up visits to maximize her success with intensive lifestyle modifications for her multiple health conditions.   Objective:   Blood pressure 137/74, pulse (!) 51, temperature 98.6 F (37 C), height '5\' 10"'$  (1.778 m), weight 265 lb (120.2 kg), SpO2 94 %. Body mass index is 38.02 kg/m.  General: Cooperative, alert, well developed, in no acute  distress. HEENT: Conjunctivae and lids unremarkable. Cardiovascular: Regular rhythm.  Lungs: Normal work of breathing. Neurologic: No focal deficits.   Lab Results  Component Value Date   CREATININE 1.23 (H) 04/12/2021   BUN 33 (H) 04/12/2021   NA 140 04/12/2021   K 3.8 04/12/2021   CL 101 04/12/2021   CO2 32 04/12/2021    Lab Results  Component Value Date   ALT 20 04/12/2021   AST 17 04/12/2021   ALKPHOS 95 04/12/2021   BILITOT 0.6 04/12/2021   Lab Results  Component Value Date   HGBA1C 6.3 (H) 03/22/2021   HGBA1C 5.8 (A) 10/24/2020   HGBA1C 6.2 (H) 03/29/2020   HGBA1C 6.3 (H) 10/25/2019   HGBA1C 6.3 (H) 03/26/2019   Lab Results  Component Value Date   INSULIN 21.5 03/29/2020   Lab Results  Component Value Date   TSH 0.107 (L) 05/18/2020   Lab Results  Component Value Date   CHOL 203 (H) 03/29/2020   HDL 37 (L) 03/29/2020   LDLCALC 114 (H) 03/29/2020   TRIG 296 (H) 03/29/2020   CHOLHDL 5.0 (H) 10/25/2019   Lab Results  Component Value Date   VD25OH 50.60 01/01/2021   VD25OH 45.30 10/17/2020   VD25OH 37.20 07/18/2020   Lab Results  Component Value Date   WBC 7.2 04/12/2021   HGB 13.6 04/12/2021   HCT 41.1 04/12/2021   MCV 93.4 04/12/2021   PLT 146 (L) 04/12/2021   Lab Results  Component Value Date   IRON 49 04/12/2021   TIBC 304 04/12/2021   FERRITIN 82 04/12/2021    Attestation Statements:   Reviewed by clinician on day of visit: allergies, medications, problem list, medical history, surgical history, family history, social history, and previous encounter notes.  Time spent on visit including pre-visit chart review and post-visit care and charting was 25 minutes.   IMarcille Blanco, CMA, am acting as transcriptionist for Coralie Common, MD   I have reviewed the above documentation for accuracy and completeness, and I agree with the above. - Coralie Common, MD

## 2021-06-16 DIAGNOSIS — M797 Fibromyalgia: Secondary | ICD-10-CM | POA: Diagnosis not present

## 2021-06-17 DIAGNOSIS — M797 Fibromyalgia: Secondary | ICD-10-CM | POA: Diagnosis not present

## 2021-06-18 ENCOUNTER — Telehealth (INDEPENDENT_AMBULATORY_CARE_PROVIDER_SITE_OTHER): Payer: Self-pay | Admitting: Family Medicine

## 2021-06-18 ENCOUNTER — Telehealth (INDEPENDENT_AMBULATORY_CARE_PROVIDER_SITE_OTHER): Payer: Self-pay

## 2021-06-18 DIAGNOSIS — M797 Fibromyalgia: Secondary | ICD-10-CM | POA: Diagnosis not present

## 2021-06-18 NOTE — Telephone Encounter (Signed)
Patient returned Cara's call.

## 2021-06-18 NOTE — Telephone Encounter (Signed)
Spoke with pt-CAS

## 2021-06-18 NOTE — Telephone Encounter (Signed)
Patient was seen on 06/14/21 by Dr. Jearld Shines. Patient was prescribed Mounjaro. Patient has Medicaid, insurance will not cover this medication. Please notify patient if medication is going to be changed. Thanks

## 2021-06-18 NOTE — Telephone Encounter (Signed)
Left message for pt to call-CAS

## 2021-06-19 DIAGNOSIS — M7989 Other specified soft tissue disorders: Secondary | ICD-10-CM | POA: Diagnosis not present

## 2021-06-19 DIAGNOSIS — M797 Fibromyalgia: Secondary | ICD-10-CM | POA: Diagnosis not present

## 2021-06-20 DIAGNOSIS — M797 Fibromyalgia: Secondary | ICD-10-CM | POA: Diagnosis not present

## 2021-06-21 ENCOUNTER — Other Ambulatory Visit: Payer: Self-pay | Admitting: Family Medicine

## 2021-06-21 DIAGNOSIS — M797 Fibromyalgia: Secondary | ICD-10-CM | POA: Diagnosis not present

## 2021-06-21 NOTE — Telephone Encounter (Signed)
Requested medication (s) are due for refill today: no  Requested medication (s) are on the active medication list: yes  Last refill:  04/11/2021  Future visit scheduled: no  Notes to clinic:  Failed protocol:  Cr in normal range and within 360 days  Requested Prescriptions  Pending Prescriptions Disp Refills   potassium chloride (KLOR-CON) 10 MEQ tablet [Pharmacy Med Name: potassium chloride ER 10 mEq tablet,extended release(part/cryst)] 90 tablet 0    Sig: TAKE 1 TABLET BY [place]     Endocrinology:  Minerals - Potassium Supplementation Failed - 06/21/2021  8:06 AM      Failed - Cr in normal range and within 360 days    Creatinine, Ser  Date Value Ref Range Status  04/12/2021 1.23 (H) 0.44 - 1.00 mg/dL Final          Passed - K in normal range and within 360 days    Potassium  Date Value Ref Range Status  04/12/2021 3.8 3.5 - 5.1 mmol/L Final          Passed - Valid encounter within last 12 months    Recent Outpatient Visits           3 weeks ago Urinary frequency   [facility] [place], Dionne Bucy, MD   3 months ago Polyuria   [place], Vickki Muff, PA-C   3 months ago Pneumonia of left lung due to infectious organism, unspecified part of lung   [place], NP   5 months ago History of recurrent deep vein thrombosis (DVT)   [facility], Dionne Bucy, MD   6 months ago [place], Wendee Beavers, PA-C       Future Appointments             In 6 days Hollice Espy, MD [place]

## 2021-06-22 DIAGNOSIS — M797 Fibromyalgia: Secondary | ICD-10-CM | POA: Diagnosis not present

## 2021-06-23 DIAGNOSIS — M797 Fibromyalgia: Secondary | ICD-10-CM | POA: Diagnosis not present

## 2021-06-24 DIAGNOSIS — M797 Fibromyalgia: Secondary | ICD-10-CM | POA: Diagnosis not present

## 2021-06-25 ENCOUNTER — Ambulatory Visit: Payer: Medicaid Other | Admitting: Orthopedic Surgery

## 2021-06-25 DIAGNOSIS — M797 Fibromyalgia: Secondary | ICD-10-CM | POA: Diagnosis not present

## 2021-06-25 NOTE — Progress Notes (Signed)
06/27/21 1:42 PM   Jane [name] November 10-06-1966 [id number]  Referring provider:  Virginia [name], [address] [place],  [place] [address] Chief Complaint  Patient presents with   Nocturia     HPI: Jane [name] is a 55 y.o.female who presents today for further evaluation of urinary issues.   She was referred by PCP Dr. Edison [name].   She has had several months of urinary symptoms such as urinary frequency, nocturia, and mixed stress and urge incontinence. Urinalysis on 05/31/2021  was unremarkable.    She has type 2 diabetes mellitus with hyperglycemia, recent HGbA1c  was 6.3 . She has chronic constipation on linzess, she also has sleep apnea which she only sometimes uses CPAP.    She states today her urinary symptoms that started in 2020 and continue to worsen. She wakes up every 1-2 hours throughout the night. She states she has accidents throughout the day and night. She wears depends and soaks through them during the night.   She is primarily concerned about fatigue from lack of sleeping.  She does also have RLS.  She does drink primarily water and 3 small cans of pepsi daily, significantly down from previously.  She avoids beverages before bed.    She states she has had a hysterectomy and has been told there is blood in her urine.   He also states that oxybutynin has not improved her symptoms. She uses CPAP sometimes.   She states she has been experiencing pain during sexual intercourse and she experiences vaginal burning as well. She does also think that she may have some pelvic organ prolapse as her boyfriend has mentioned that she has a large vagina.  She does sometimes feels mild vaginal bulging.     PMH: Past Medical History:  Diagnosis Date   ADD (attention deficit disorder)    Anxiety    Back pain    Bilateral swelling of feet    Bipolar 1 disorder (HCC)    Bipolar 1 disorder (HCC)    Bipolar disorder (HCC)    Chewing difficulty     Chronic fatigue syndrome    Chronic kidney disease    Stage 3 kidney disease;dx by Dr. Sinda Du.    Constipation    Depression    Diabetes mellitus without complication (HCC)    diet controlled   Dyspnea    with exertion   GERD (gastroesophageal reflux disease)    Headache    migraines   High cholesterol    History of blood clots    History of DVT (deep vein thrombosis)    left leg   Hypertension    states under control with meds., has been on med. x 2 years   Hypothyroidism    Joint pain    Neuropathy    Obsessive-compulsive disorder    Peripheral vascular disease ([place])    Prediabetes    Respiratory failure requiring intubation ([place])    Restless leg syndrome    Shortness of breath    Sleep apnea    Thrombocytopenia ([place]) 09/15/2019   Trigger thumb of left hand 01/2018   Trigger thumb of right hand     Surgical History: Past Surgical History:  Procedure Laterality Date   ABDOMINAL HYSTERECTOMY  06/2016   complete   APPLICATION OF WOUND VAC Left 04/27/2019   Procedure: APPLICATION OF WOUND VAC LEFT GROIN;  Surgeon: Waynetta Sandy, MD;  Location: [facility];  Service: Vascular;  Laterality: Left;   AV  FISTULA PLACEMENT Left 11/24/2018   Procedure: ARTERIOVENOUS (AV) FISTULA CREATION LEFT SFA TO LEFT FEMORAL VEIN;  Surgeon: Waynetta Sandy, MD;  Location: [facility];  Service: Vascular;  Laterality: Left;   BACK SURGERY     CHOLECYSTECTOMY     COLONOSCOPY WITH PROPOFOL N/A 11/22/2019   Procedure: COLONOSCOPY WITH PROPOFOL;  Surgeon: [name], MD;  Location: [facility];  Service: Gastroenterology;  Laterality: N/A;   FEMORAL ARTERY EXPLORATION  03/30/2019   Procedure: Left Common Femoral Artery and Vein Exploration;  Surgeon: Waynetta Sandy, MD;  Location: [facility];  Service: Vascular;;   FEMORAL-FEMORAL BYPASS GRAFT Left 11/24/2018   Procedure: BYPASS GRAFT FEMORAL-FEMORAL VENOUS LEFT TO RIGHT PALMA PROCEDURE USING CRYOVEIN;  Surgeon: Waynetta Sandy, MD;  Location: [facility];  Service: Vascular;  Laterality: Left;   GROIN DEBRIDEMENT Left 04/27/2019   Procedure: GROIN DEBRIDEMENT;  Surgeon: Waynetta Sandy, MD;  Location: [facility];  Service: Vascular;  Laterality: Left;   INSERTION OF ILIAC STENT  03/30/2019   Procedure: Stent of left common, external iliac veins and left common femoral vein;  Surgeon: Waynetta Sandy, MD;  Location: [facility];  Service: Vascular;;   KNEE ARTHROSCOPY WITH MENISCAL REPAIR Left 11/14/2020   Procedure: LEFT KNEE ARTHROSCOPY WITH PARTIAL MEDIAL MENISCECTOMY;  Surgeon: Carole Civil, MD;  Location: [facility];  Service: Orthopedics;  Laterality: Left;   LOWER EXTREMITY VENOGRAPHY N/A 08/17/2018   Procedure: LOWER EXTREMITY VENOGRAPHY - Central Venogram;  Surgeon: Waynetta Sandy, MD;  Location: [facility];  Service: Cardiovascular;  Laterality: N/A;   LOWER EXTREMITY VENOGRAPHY Bilateral 03/09/2019   Procedure: LOWER EXTREMITY VENOGRAPHY;  Surgeon: Waynetta Sandy, MD;  Location: [facility];  Service: Cardiovascular;  Laterality: Bilateral;   LOWER EXTREMITY VENOGRAPHY Left 08/16/2019   Procedure: LOWER EXTREMITY VENOGRAPHY;  Surgeon: Waynetta Sandy, MD;  Location: [facility];  Service: Cardiovascular;  Laterality: Left;   LUMBAR FUSION  11/21/2000   L5-S1   LUMBAR SPINE SURGERY     x 2 others   PATCH ANGIOPLASTY Left 03/30/2019   Procedure: Patch Angioplasty of the Left Common Femoral Vein using Venosure Biologic patch;  Surgeon: Waynetta Sandy, MD;  Location: [facility];  Service: Vascular;  Laterality: Left;   PERIPHERAL VASCULAR INTERVENTION Left 08/16/2019   Procedure: PERIPHERAL VASCULAR INTERVENTION;  Surgeon: Waynetta Sandy, MD;  Location: [facility];  Service: Cardiovascular;  Laterality: Left;  common femoral/femoral vein stent   TRIGGER FINGER RELEASE Right 12/01/2017   Procedure: RELEASE TRIGGER  FINGER/A-1 PULLEY RIGHT THUMB;  Surgeon: Leanora Cover, MD;  Location: [facility];  Service: Orthopedics;  Laterality: Right;   TRIGGER FINGER RELEASE Left 01/26/2018   Procedure: LEFT TRIGGER THUMB RELEASE;  Surgeon: Leanora Cover, MD;  Location: [facility];  Service: Orthopedics;  Laterality: Left;   ULTRASOUND GUIDANCE FOR VASCULAR ACCESS Right 03/30/2019   Procedure: Ultrasound-guided cannulation right internal jugular vein;  Surgeon: Waynetta Sandy, MD;  Location: [facility];  Service: Vascular;  Laterality: Right;    Home Medications:  Allergies as of 06/27/2021       Reactions   Aripiprazole Other (See Comments)   BECOMES  VIOLENT   Seroquel [quetiapine Fumarate] Other (See Comments)   BECOMES VIOLENT   Chlorpromazine Other (See Comments)   SEVERE ANXIETY   Gabapentin Other (See Comments)   NIGHTMARES        Medication List  Accurate as of June 27, 2021  1:42 PM. If you have any questions, ask your nurse or doctor.          ALPRAZolam 1 MG tablet Commonly known as: Xanax Take 1 tablet (1 mg total) by mouth 2 (two) times daily as needed for anxiety.   amitriptyline 25 MG tablet Commonly known as: ELAVIL Take 25 mg by mouth at bedtime.   amphetamine-dextroamphetamine 30 MG tablet Commonly known as: Adderall Take 1 tablet by mouth 2 (two) times daily.   aspirin EC 81 MG tablet Take 81 mg by mouth daily.   atorvastatin 20 MG tablet Commonly known as: LIPITOR TAKE 1 TABLET BY MOUTH DAILY   cetirizine 10 MG tablet Commonly known as: ZYRTEC Take 1 tablet by mouth every morning.   cycloSPORINE 0.05 % ophthalmic emulsion Commonly known as: RESTASIS Place 1 drop into both eyes 2 (two) times daily.   FLUoxetine 40 MG capsule Commonly known as: PROZAC TAKE 1 TABLET BY MOUTH DAILY   GARLIC PO Take [id number] mg by mouth daily.   hydrochlorothiazide 25 MG tablet Commonly known as: HYDRODIURIL TAKE 1 TABLET BY MOUTH  DAILY   HYDROcodone-acetaminophen 10-325 MG tablet Commonly known as: NORCO Take 1 tablet by mouth every 6 (six) hours as needed.   levothyroxine 125 MCG tablet Commonly known as: SYNTHROID Take 125 mcg by mouth daily before breakfast.   Linzess 145 MCG Caps capsule Generic drug: linaclotide TAKE ONE CAPSULE BY MOUTH DAILY What changed:  how much to take when to take this reasons to take this   methocarbamol 750 MG tablet Commonly known as: ROBAXIN Take 1 tablet (750 mg total) by mouth 2 (two) times daily as needed for muscle spasms.   multivitamin capsule Take 1 capsule by mouth daily.   Olopatadine HCl 0.2 % Soln Place 1 drop into both eyes daily.   OMEGA-3 FISH OIL PO Take 600 mg by mouth daily.   oxybutynin 10 MG 24 hr tablet Commonly known as: Ditropan XL Take 1 tablet (10 mg total) by mouth at bedtime.   pantoprazole 40 MG tablet Commonly known as: PROTONIX TAKE 1 TABLET BY MOUTH DAILY   potassium chloride 10 MEQ tablet Commonly known as: KLOR-CON TAKE 1 TABLET BY MOUTH THREE TIMES DAILY   pregabalin 100 MG capsule Commonly known as: LYRICA Take 100 mg by mouth 4 (four) times daily.   rOPINIRole 2 MG tablet Commonly known as: REQUIP TAKE 1 TABLET BY MOUTH TWICE DAILY   triamcinolone cream 0.1 % Commonly known as: KENALOG Apply topically 2 (two) times daily.   warfarin 5 MG tablet Commonly known as: COUMADIN Take as directed by the anticoagulation clinic. If you are unsure how to take this medication, talk to your nurse or doctor. Original instructions: TAKE 1 AND 1/2 TABLETS BY MOUTH ON TUEDAYS AND THURSDAYS, THEN TAKE 1 TABLET DAILY ON ALL OTHER DAYS        Allergies:  Allergies  Allergen Reactions   Aripiprazole Other (See Comments)    BECOMES  VIOLENT    Seroquel [Quetiapine Fumarate] Other (See Comments)    BECOMES VIOLENT   Chlorpromazine Other (See Comments)    SEVERE ANXIETY    Gabapentin Other (See Comments)    NIGHTMARES      Family History: Family History  Problem Relation Age of Onset   Heart disease Mother    Hyperlipidemia Mother    Hypertension Mother    Bipolar disorder Mother    Stroke Mother  Depression Mother    Sleep apnea Mother    Obesity Mother    Diabetes Father    Heart disease Father    Hyperlipidemia Father    Hypertension Father    Sleep apnea Father    Obesity Father    Drug abuse Daughter    ADD / ADHD Daughter    Drug abuse Daughter    Anxiety disorder Daughter    Bipolar disorder Daughter    Hypertension Sister    Hypertension Brother    Hyperlipidemia Brother    Heart disease Brother    Bipolar disorder Maternal Aunt    Suicidality Maternal Aunt     Social History:  reports that she has never smoked. She has never used smokeless tobacco. She reports that she does not drink alcohol and does not use drugs.   Physical Exam: BP (!) 143/83   Pulse 66   Ht '5\' 10"'$  (1.778 m)   Wt 265 lb (120.2 kg)   LMP  (LMP Unknown)   BMI 38.02 kg/m   Constitutional:  Alert and oriented, No acute distress. HEENT: [place] AT, moist mucus membranes.  Trachea midline, no masses. Cardiovascular: No clubbing, cyanosis, or edema. Respiratory: Normal respiratory effort, no increased work of breathing. Skin: No rashes, bruises or suspicious lesions. Neurologic: Grossly intact, no focal deficits, moving all 4 extremities. Psychiatric: Normal mood and affect.  Laboratory Data:  Lab Results  Component Value Date   CREATININE 1.23 (H) 04/12/2021    Lab Results  Component Value Date   HGBA1C 6.3 (H) 03/22/2021    Urinalysis Results for orders placed or performed in visit on 06/27/21  Microscopic Examination   Urine  Result Value Ref Range   WBC, UA >30 (H) 0 - 5 /hpf   RBC None seen 0 - 2 /hpf   Epithelial Cells (non renal) 0-10 0 - 10 /hpf   Bacteria, UA Many (A) None seen/Few  Urinalysis, Complete  Result Value Ref Range   Specific Gravity, UA 1.015 1.005 - 1.030   pH, UA  5.0 5.0 - 7.5   Color, UA Yellow Yellow   Appearance Ur Cloudy (A) Clear   Leukocytes,UA 2+ (A) Negative   Protein,UA 1+ (A) Negative/Trace   Glucose, UA Negative Negative   Ketones, UA Negative Negative   RBC, UA 2+ (A) Negative   Bilirubin, UA Negative Negative   Urobilinogen, Ur 0.2 0.2 - 1.0 mg/dL   Nitrite, UA Negative Negative   Microscopic Examination See below:   BLADDER SCAN AMB NON-IMAGING  Result Value Ref Range   Scan Result 30     Assessment & Plan:   Acute cystitis  - Urinalysis today showed bacteria  - Prescribed bactrim DS bid x 5 days -Reassess for improvement of urinary symptoms after UTI treatment as this is likely a contributing factor   Overactive bladder   - Prescribed Gemtesa 75 mg in recent of urinary symptoms  -We discussed first-line therapies versus behavioral modification followed by second line therapies which include oral anticholinergic or beta 3 agonist -Given her personal history of significant constipation, would like to try to avoid anticholinergics.  We will try beta 3 agonist and she was given samples as outlined above for the next month to try.  We will plan to follow her closely.   -Consider pelvic exam in the near future as this may be contributing factor. -Adequate bladder emptying  Follow-up for urinalysis , recheck urinary symptoms, possible pelvic exam  I,Kailey Littlejohn,acting as a scribe  for Hollice Espy, MD.,have documented all relevant documentation on the behalf of Hollice Espy, MD,as directed by  Hollice Espy, MD while in the presence of Hollice Espy, MD.  I have reviewed the above documentation for accuracy and completeness, and I agree with the above.   Hollice Espy, MD    [facility] [address], [name], [place] [address] [phone]

## 2021-06-26 DIAGNOSIS — M797 Fibromyalgia: Secondary | ICD-10-CM | POA: Diagnosis not present

## 2021-06-26 DIAGNOSIS — L600 Ingrowing nail: Secondary | ICD-10-CM | POA: Diagnosis not present

## 2021-06-27 ENCOUNTER — Ambulatory Visit: Payer: Medicaid Other | Admitting: Urology

## 2021-06-27 ENCOUNTER — Other Ambulatory Visit: Payer: Self-pay

## 2021-06-27 ENCOUNTER — Encounter: Payer: Self-pay | Admitting: Urology

## 2021-06-27 VITALS — BP 143/83 | HR 66 | Ht 70.0 in | Wt 265.0 lb

## 2021-06-27 DIAGNOSIS — N3001 Acute cystitis with hematuria: Secondary | ICD-10-CM

## 2021-06-27 DIAGNOSIS — R351 Nocturia: Secondary | ICD-10-CM

## 2021-06-27 DIAGNOSIS — M797 Fibromyalgia: Secondary | ICD-10-CM | POA: Diagnosis not present

## 2021-06-27 LAB — BLADDER SCAN AMB NON-IMAGING: Scan Result: 30

## 2021-06-27 MED ORDER — SULFAMETHOXAZOLE-TRIMETHOPRIM 800-160 MG PO TABS: 1.0000 | ORAL_TABLET | Freq: Two times a day (BID) | ORAL | 0 refills | Status: DC

## 2021-06-27 MED ORDER — GEMTESA 75 MG PO TABS: 75.0000 mg | ORAL_TABLET | Freq: Every day | ORAL | 0 refills | Status: DC

## 2021-06-28 DIAGNOSIS — M797 Fibromyalgia: Secondary | ICD-10-CM | POA: Diagnosis not present

## 2021-06-28 LAB — URINALYSIS, COMPLETE
Bilirubin, UA: NEGATIVE
Glucose, UA: NEGATIVE
Ketones, UA: NEGATIVE
Nitrite, UA: NEGATIVE
Specific Gravity, UA: 1.015 (ref 1.005–1.030)
Urobilinogen, Ur: 0.2 mg/dL (ref 0.2–1.0)
pH, UA: 5 (ref 5.0–7.5)

## 2021-06-28 LAB — MICROSCOPIC EXAMINATION
RBC, Urine: NONE SEEN /hpf (ref 0–2)
WBC, UA: >30 /hpf — ABNORMAL HIGH (ref 0–5)

## 2021-06-29 ENCOUNTER — Telehealth: Payer: Self-pay | Admitting: *Deleted

## 2021-06-29 DIAGNOSIS — M797 Fibromyalgia: Secondary | ICD-10-CM | POA: Diagnosis not present

## 2021-06-29 NOTE — Telephone Encounter (Signed)
Copied from [place] [id number]. Topic: General - Other >> Jun 29, 2021 10:16 AM Lennox Solders wrote: Reason for CRM: Sharyn Lull with [facility] in [place] is calling and needs pt  medication list fax to them [phone]

## 2021-06-30 ENCOUNTER — Other Ambulatory Visit: Payer: Self-pay | Admitting: Family Medicine

## 2021-06-30 DIAGNOSIS — M797 Fibromyalgia: Secondary | ICD-10-CM | POA: Diagnosis not present

## 2021-06-30 DIAGNOSIS — H04123 Dry eye syndrome of bilateral lacrimal glands: Secondary | ICD-10-CM | POA: Diagnosis not present

## 2021-06-30 NOTE — Telephone Encounter (Signed)
Requested medication (s) are due for refill today: -  Requested medication (s) are on the active medication list: historical med  Last refill:  04/11/21  Future visit scheduled: no  Notes to clinic:  historical med and provider   Requested Prescriptions  Pending Prescriptions Disp Refills   cetirizine (ZYRTEC) 10 MG tablet [Pharmacy Med Name: cetirizine 10 mg tablet] 90 tablet 1    Sig: TAKE 1 TABLET BY MOUTH DAILY     Ear, Nose, and Throat:  Antihistamines Passed - 06/30/2021  8:02 AM      Passed - Valid encounter within last 12 months    Recent Outpatient Visits           1 month ago Urinary frequency   [facility] [place], Dionne Bucy, MD   3 months ago Polyuria   [place], PA-C   3 months ago Pneumonia of left lung due to infectious organism, unspecified part of lung   [place], NP   5 months ago History of recurrent deep vein thrombosis (DVT)   [facility], Dionne Bucy, MD   6 months ago [place], Wendee Beavers, [place]       Future Appointments             In 1 month Hollice Espy, MD [place]

## 2021-07-01 DIAGNOSIS — M797 Fibromyalgia: Secondary | ICD-10-CM | POA: Diagnosis not present

## 2021-07-02 ENCOUNTER — Encounter (HOSPITAL_COMMUNITY): Payer: Self-pay | Admitting: Hematology

## 2021-07-02 DIAGNOSIS — M797 Fibromyalgia: Secondary | ICD-10-CM | POA: Diagnosis not present

## 2021-07-02 NOTE — Telephone Encounter (Signed)
Will need ROI signed by patient if we did not refer there before we can send [place]

## 2021-07-02 NOTE — Telephone Encounter (Signed)
Michelle with [facility] advised.

## 2021-07-03 DIAGNOSIS — M797 Fibromyalgia: Secondary | ICD-10-CM | POA: Diagnosis not present

## 2021-07-03 LAB — CULTURE, URINE COMPREHENSIVE

## 2021-07-04 DIAGNOSIS — M797 Fibromyalgia: Secondary | ICD-10-CM | POA: Diagnosis not present

## 2021-07-05 ENCOUNTER — Encounter (HOSPITAL_COMMUNITY): Payer: Self-pay | Admitting: Hematology

## 2021-07-05 DIAGNOSIS — M797 Fibromyalgia: Secondary | ICD-10-CM | POA: Diagnosis not present

## 2021-07-06 ENCOUNTER — Encounter (HOSPITAL_COMMUNITY): Payer: Self-pay | Admitting: Hematology

## 2021-07-06 DIAGNOSIS — M797 Fibromyalgia: Secondary | ICD-10-CM | POA: Diagnosis not present

## 2021-07-07 ENCOUNTER — Other Ambulatory Visit: Payer: Self-pay | Admitting: Family Medicine

## 2021-07-07 ENCOUNTER — Encounter (HOSPITAL_COMMUNITY): Payer: Self-pay | Admitting: Hematology

## 2021-07-07 DIAGNOSIS — I152 Hypertension secondary to endocrine disorders: Secondary | ICD-10-CM

## 2021-07-07 DIAGNOSIS — M797 Fibromyalgia: Secondary | ICD-10-CM | POA: Diagnosis not present

## 2021-07-07 DIAGNOSIS — E1159 Type 2 diabetes mellitus with other circulatory complications: Secondary | ICD-10-CM

## 2021-07-07 NOTE — Telephone Encounter (Signed)
Requested medication (s) are due for refill today: yes to both HCTZ and Atorvastatin  Requested medication (s) are on the active medication list: yes to both meds  Last refill:  same day 04/01/21  Future visit scheduled: no  Notes to clinic:  HCTZ: elevated creatine could not find where this was address by PCP and Atorvastatin: overdue lab work   Requested Prescriptions  Pending Prescriptions Disp Refills   hydrochlorothiazide (HYDRODIURIL) 25 MG tablet [Pharmacy Med Name: hydrochlorothiazide 25 mg tablet] 90 tablet 0    Sig: TAKE 1 TABLET BY MOUTH DAILY     Cardiovascular: Diuretics - Thiazide Failed - 07/07/2021  7:05 AM      Failed - Cr in normal range and within 360 days    Creatinine, Ser  Date Value Ref Range Status  04/12/2021 1.23 (H) 0.44 - 1.00 mg/dL Final          Failed - Last BP in normal range    BP Readings from Last 1 Encounters:  06/27/21 (!) 143/83          Passed - Ca in normal range and within 360 days    Calcium  Date Value Ref Range Status  04/12/2021 9.5 8.9 - 10.3 mg/dL Final   Calcium, Ion  Date Value Ref Range Status  08/16/2019 1.18 1.15 - 1.40 mmol/L Final          Passed - K in normal range and within 360 days    Potassium  Date Value Ref Range Status  04/12/2021 3.8 3.5 - 5.1 mmol/L Final          Passed - Na in normal range and within 360 days    Sodium  Date Value Ref Range Status  04/12/2021 140 135 - 145 mmol/L Final  03/22/2021 142 134 - 144 mmol/L Final          Passed - Valid encounter within last 6 months    Recent Outpatient Visits           1 month ago Urinary frequency   [facility] [place], Dionne Bucy, MD   3 months ago Polyuria   [place], Vickki Muff, PA-C   3 months ago Pneumonia of left lung due to infectious organism, unspecified part of lung   [facility] Jon Bill[place], NP   6 months ago History of recurrent deep vein thrombosis (DVT)   [facility], Dionne Bucy, MD   6 months ago [place], Wendee Beavers, [place]       Future Appointments             In 1 month Hollice Espy, MD [place]             atorvastatin (LIPITOR) 20 MG tablet [Pharmacy Med Name: atorvastatin 20 mg tablet] 90 tablet 0    Sig: TAKE 1 TABLET BY MOUTH DAILY     Cardiovascular:  Antilipid - Statins Failed - 07/07/2021  7:05 AM      Failed - Total Cholesterol in normal range and within 360 days    Cholesterol, Total  Date Value Ref Range Status  03/29/2020 203 (H) 100 - 199 mg/dL Final          Failed - LDL in normal range and within 360 days    LDL Chol Calc (NIH)  Date Value Ref Range Status  03/29/2020 114 (H) 0 - 99 mg/dL Final  Failed - HDL in normal range and within 360 days    HDL  Date Value Ref Range Status  03/29/2020 37 (L) >39 mg/dL Final          Failed - Triglycerides in normal range and within 360 days    Triglycerides  Date Value Ref Range Status  03/29/2020 296 (H) 0 - 149 mg/dL Final          Passed - Patient is not pregnant      Passed - Valid encounter within last 12 months    Recent Outpatient Visits           1 month ago Urinary frequency   [place], Dionne Bucy, MD   3 months ago Polyuria   [place], PA-C   3 months ago Pneumonia of left lung due to infectious organism, unspecified part of lung   [place], NP   6 months ago History of recurrent deep vein thrombosis (DVT)   [facility], Dionne Bucy, MD   6 months ago [place], Wendee Beavers, [place]       Future Appointments             In 1 month Hollice Espy, MD [place]

## 2021-07-08 ENCOUNTER — Encounter (HOSPITAL_COMMUNITY): Payer: Self-pay | Admitting: Hematology

## 2021-07-08 DIAGNOSIS — M797 Fibromyalgia: Secondary | ICD-10-CM | POA: Diagnosis not present

## 2021-07-09 ENCOUNTER — Encounter (HOSPITAL_COMMUNITY): Payer: Self-pay | Admitting: Hematology

## 2021-07-10 ENCOUNTER — Encounter (HOSPITAL_COMMUNITY): Payer: Self-pay | Admitting: Hematology

## 2021-07-10 ENCOUNTER — Encounter (INDEPENDENT_AMBULATORY_CARE_PROVIDER_SITE_OTHER): Payer: Self-pay | Admitting: Family Medicine

## 2021-07-10 ENCOUNTER — Other Ambulatory Visit: Payer: Self-pay

## 2021-07-10 ENCOUNTER — Ambulatory Visit (INDEPENDENT_AMBULATORY_CARE_PROVIDER_SITE_OTHER): Payer: Self-pay | Admitting: Family Medicine

## 2021-07-10 VITALS — BP 142/96 | HR 67 | Temp 98.0°F | Ht 70.0 in | Wt 270.0 lb

## 2021-07-10 DIAGNOSIS — F3289 Other specified depressive episodes: Secondary | ICD-10-CM | POA: Diagnosis not present

## 2021-07-10 DIAGNOSIS — E1165 Type 2 diabetes mellitus with hyperglycemia: Secondary | ICD-10-CM | POA: Diagnosis not present

## 2021-07-10 DIAGNOSIS — Z6841 Body Mass Index (BMI) 40.0 and over, adult: Secondary | ICD-10-CM | POA: Diagnosis not present

## 2021-07-10 DIAGNOSIS — M797 Fibromyalgia: Secondary | ICD-10-CM | POA: Diagnosis not present

## 2021-07-10 MED ORDER — TRULICITY 0.75 MG/0.5ML ~~LOC~~ SOAJ: 0.7500 mg | SUBCUTANEOUS | 0 refills | Status: DC

## 2021-07-11 ENCOUNTER — Ambulatory Visit (INDEPENDENT_AMBULATORY_CARE_PROVIDER_SITE_OTHER): Payer: Medicaid Other

## 2021-07-11 ENCOUNTER — Encounter (HOSPITAL_COMMUNITY): Payer: Self-pay | Admitting: Hematology

## 2021-07-11 DIAGNOSIS — Z86718 Personal history of other venous thrombosis and embolism: Secondary | ICD-10-CM | POA: Diagnosis not present

## 2021-07-11 DIAGNOSIS — M797 Fibromyalgia: Secondary | ICD-10-CM | POA: Diagnosis not present

## 2021-07-11 LAB — POCT INR
INR: 2.9 (ref 2.0–3.0)
Prothrombin Time: 34.6

## 2021-07-11 NOTE — Progress Notes (Signed)
Chief Complaint:   OBESITY Jane [name] is here to discuss her progress with her obesity treatment plan along with follow-up of her obesity related diagnoses. Jane [name] is on the Category 4 Plan and states she is following her eating plan approximately 50% of the time. Jane [name] states she is not currently exercising.  Today's visit was #: 20 Starting weight: 283 lbs Starting date: 03/29/2020 Today's weight: 270 lbs Today's date: 07/10/2021 Total lbs lost to date: 13 Total lbs lost since last in-office visit: 0  Interim History: Jane [name] couldn't get Mounjaro. She is going through depression and slept almost all weekend- really struggling with anhedonia. She has an appt with psych in a few days. Pt has been doing meal plan about 50% of the time. She does feel her intake has been decreased due to mood. She is in a significant amount of pain.  Subjective:   1. Other depression Pt denies suicidal or homicidal ideations. She is experiencing anhedonia, fatigue, and increase in sleep. She is taking Prozac.  2. Type 2 diabetes mellitus with hyperglycemia, without long-term current use of insulin (Jane [name]) Mounjaro is not covered by insurance. Tom's last [name] was 6.3. Insulin level was not done at that time.  Assessment/Plan:   1. Other depression Behavior modification techniques were discussed today to help Jane [name] deal with her emotional/non-hunger eating behaviors.  Orders and follow up as documented in patient record. Follow up with Dr. Harrington Challenger in 2 days for further management.  2. Type 2 diabetes mellitus with hyperglycemia, without long-term current use of insulin (Jane [name]) Stop Mounjaro. Jane [name] is to start Trulicity [id number] mg. Good blood sugar control is important to decrease the likelihood of diabetic complications such as nephropathy, neuropathy, limb loss, blindness, coronary artery disease, and death. Intensive lifestyle modification including diet, exercise and weight loss are the first line of treatment for  diabetes.   Start- Dulaglutide (TRULICITY) A999333 0000000 SOPN; Inject 0.75 mg into the skin once a week.  Dispense: 2 mL; Refill: 0  3. Obesity with current BMI of 38.8  Jane [name] is currently in the action stage of change. As such, her goal is to continue with weight loss efforts. She has agreed to practicing portion control and making smarter food choices, such as increasing vegetables and decreasing simple carbohydrates.   Exercise goals: No exercise has been prescribed at this time.  Behavioral modification strategies: increasing lean protein intake, meal planning and cooking strategies, keeping healthy foods in the home, and planning for success.  Jane [name] has agreed to follow-up with our clinic in 2-3 weeks. She was informed of the importance of frequent follow-up visits to maximize her success with intensive lifestyle modifications for her multiple health conditions.   Objective:   Blood pressure (!) 142/96, pulse 67, temperature 98 F (36.7 C), height '5\' 10"'$  (1.778 m), weight 270 lb (122.5 kg), SpO2 95 %. Body mass index is 38.74 kg/m.  General: Cooperative, alert, well developed, in no acute distress. HEENT: Conjunctivae and lids unremarkable. Cardiovascular: Regular rhythm.  Lungs: Normal work of breathing. Neurologic: No focal deficits.   Lab Results  Component Value Date   CREATININE 1.23 (H) 04/12/2021   BUN 33 (H) 04/12/2021   NA 140 04/12/2021   K 3.8 04/12/2021   CL 101 04/12/2021   CO2 32 04/12/2021   Lab Results  Component Value Date   ALT 20 04/12/2021   AST 17 04/12/2021   ALKPHOS 95 04/12/2021   BILITOT 0.6 04/12/2021   Lab Results  Component Value  Date   HGBA1C 6.3 (H) 03/22/2021   HGBA1C 5.8 (A) 10/24/2020   HGBA1C 6.2 (H) 03/29/2020   HGBA1C 6.3 (H) 10/25/2019   HGBA1C 6.3 (H) 03/26/2019   Lab Results  Component Value Date   INSULIN 21.5 03/29/2020   Lab Results  Component Value Date   TSH 0.107 (L) 05/18/2020   Lab Results  Component Value  Date   CHOL 203 (H) 03/29/2020   HDL 37 (L) 03/29/2020   LDLCALC 114 (H) 03/29/2020   TRIG 296 (H) 03/29/2020   CHOLHDL 5.0 (H) 10/25/2019   Lab Results  Component Value Date   VD25OH 50.60 01/01/2021   VD25OH 45.30 10/17/2020   VD25OH 37.20 07/18/2020   Lab Results  Component Value Date   WBC 7.2 04/12/2021   HGB 13.6 04/12/2021   HCT 41.1 04/12/2021   MCV 93.4 04/12/2021   PLT 146 (L) 04/12/2021   Lab Results  Component Value Date   IRON 49 04/12/2021   TIBC 304 04/12/2021   FERRITIN 82 04/12/2021   Attestation Statements:   Reviewed by clinician on day of visit: allergies, medications, problem list, medical history, surgical history, family history, social history, and previous encounter notes.  Coral Ceo, CMA, am acting as transcriptionist for Coralie Common, MD.   I have reviewed the above documentation for accuracy and completeness, and I agree with the above. - Coralie Common, MD

## 2021-07-11 NOTE — Patient Instructions (Signed)
Description   Continue 5mg  daily except 2.5mg  Monday and Wednesday.  Recheck in four weeks.

## 2021-07-12 ENCOUNTER — Encounter (HOSPITAL_COMMUNITY): Payer: Self-pay | Admitting: Hematology

## 2021-07-12 ENCOUNTER — Other Ambulatory Visit: Payer: Self-pay

## 2021-07-12 ENCOUNTER — Telehealth (INDEPENDENT_AMBULATORY_CARE_PROVIDER_SITE_OTHER): Payer: Medicaid Other | Admitting: Psychiatry

## 2021-07-12 ENCOUNTER — Encounter (HOSPITAL_COMMUNITY): Payer: Self-pay | Admitting: Psychiatry

## 2021-07-12 DIAGNOSIS — M797 Fibromyalgia: Secondary | ICD-10-CM | POA: Diagnosis not present

## 2021-07-12 DIAGNOSIS — F3130 Bipolar disorder, current episode depressed, mild or moderate severity, unspecified: Secondary | ICD-10-CM | POA: Diagnosis not present

## 2021-07-12 DIAGNOSIS — F900 Attention-deficit hyperactivity disorder, predominantly inattentive type: Secondary | ICD-10-CM | POA: Diagnosis not present

## 2021-07-12 DIAGNOSIS — L600 Ingrowing nail: Secondary | ICD-10-CM | POA: Diagnosis not present

## 2021-07-12 MED ORDER — AMPHETAMINE-DEXTROAMPHETAMINE 30 MG PO TABS: 30.0000 mg | ORAL_TABLET | Freq: Two times a day (BID) | ORAL | 0 refills | Status: DC

## 2021-07-12 MED ORDER — ALPRAZOLAM 1 MG PO TABS: 1.0000 mg | ORAL_TABLET | Freq: Two times a day (BID) | ORAL | 2 refills | Status: DC | PRN

## 2021-07-12 MED ORDER — FLUOXETINE HCL 20 MG PO CAPS: 20.0000 mg | ORAL_CAPSULE | Freq: Every day | ORAL | 2 refills | Status: DC

## 2021-07-12 MED ORDER — FLUOXETINE HCL 40 MG PO CAPS: ORAL_CAPSULE | ORAL | 2 refills | Status: DC

## 2021-07-12 NOTE — Progress Notes (Signed)
Virtual Visit via Telephone Note  I connected with Jane [name] on 07/12/21 at 10:00 AM EDT by telephone and verified that I am speaking with the correct person using two identifiers.  Location: Patient: home Provider: home office   I discussed the limitations, risks, security and privacy concerns of performing an evaluation and management service by telephone and the availability of in person appointments. I also discussed with the patient that there may be a patient responsible charge related to this service. The patient expressed understanding and agreed to proceed.      I discussed the assessment and treatment plan with the patient. The patient was provided an opportunity to ask questions and all were answered. The patient agreed with the plan and demonstrated an understanding of the instructions.   The patient was advised to call back or seek an in-person evaluation if the symptoms worsen or if the condition fails to improve as anticipated.  I provided 15 minutes of non-face-to-face time during this encounter.   [name], MD  [facility] MD/PA/NP OP Progress Note  07/12/2021 10:26 AM Jane [name]  MRN:  [id number]  Chief Complaint: depression HPI: This patient is a 55 year old divorced white female who lives alone in [place].  She is on disability due to bipolar disorder.  The patient is seen after 6 weeks today as a work in.  She states that for the last 2 weeks she has become more depressed.  She is not entirely sure why.  Her birthday is coming up this month and she feels like she is not where she should be.  She has had a lot of health issues and has had severe financial constraints.  She states over the last couple weeks she has developed anhedonia with lack of interest in doing her usual activities.  She does not feel like meeting with her children or grandchildren.  She does not really feel like leaving the house.  She sits around all day.  Her energy is low.  Her recent TSH  was normal and she does not note any other new physical problems.  She denies any thoughts of suicidal ideation.  She either sleeps too much or has difficulty sleeping.  Her appetite has decreased.  Overall she states she just "does not feel like doing anything."  I suggested that as a starting point we increase her Prozac from 40 to 60 mg.  I encouraged her to do a bit of walking each day to try to eat healthy foods and to at least connect to people over the phone.  She is in agreement. Visit Diagnosis:    ICD-10-CM   1. Bipolar I disorder, most recent episode depressed ([place])  F31.30     2. Attention deficit hyperactivity disorder (ADHD), predominantly inattentive type  F90.0       Past Psychiatric History: Long-term outpatient treatment  Past Medical History:  Past Medical History:  Diagnosis Date   ADD (attention deficit disorder)    Anxiety    Back pain    Bilateral swelling of feet    Bipolar 1 disorder (HCC)    Bipolar 1 disorder (HCC)    Bipolar disorder (HCC)    Chewing difficulty    Chronic fatigue syndrome    Chronic kidney disease    Stage 3 kidney disease;dx by Dr. Sinda Du.    Constipation    Depression    Diabetes mellitus without complication (HCC)    diet controlled   Dyspnea    with exertion  GERD (gastroesophageal reflux disease)    Headache    migraines   High cholesterol    History of blood clots    History of DVT (deep vein thrombosis)    left leg   Hypertension    states under control with meds., has been on med. x 2 years   Hypothyroidism    Joint pain    Neuropathy    Obsessive-compulsive disorder    Peripheral vascular disease ([place])    Prediabetes    Respiratory failure requiring intubation ([place])    Restless leg syndrome    Shortness of breath    Sleep apnea    Thrombocytopenia ([place]) 09/15/2019   Trigger thumb of left hand 01/2018   Trigger thumb of right hand     Past Surgical History:  Procedure Laterality Date   ABDOMINAL  HYSTERECTOMY  06/2016   complete   APPLICATION OF WOUND VAC Left 04/27/2019   Procedure: APPLICATION OF WOUND VAC LEFT GROIN;  Surgeon: Waynetta Sandy, MD;  Location: [place];  Service: Vascular;  Laterality: Left;   AV FISTULA PLACEMENT Left 11/24/2018   Procedure: ARTERIOVENOUS (AV) FISTULA CREATION LEFT SFA TO LEFT FEMORAL VEIN;  Surgeon: Waynetta Sandy, MD;  Location: [place];  Service: Vascular;  Laterality: Left;   BACK SURGERY     CHOLECYSTECTOMY     COLONOSCOPY WITH PROPOFOL N/A 11/22/2019   Procedure: COLONOSCOPY WITH PROPOFOL;  Surgeon: [name], MD;  Location: [facility];  Service: Gastroenterology;  Laterality: N/A;   FEMORAL ARTERY EXPLORATION  03/30/2019   Procedure: Left Common Femoral Artery and Vein Exploration;  Surgeon: Waynetta Sandy, MD;  Location: [place];  Service: Vascular;;   FEMORAL-FEMORAL BYPASS GRAFT Left 11/24/2018   Procedure: BYPASS GRAFT FEMORAL-FEMORAL VENOUS LEFT TO RIGHT PALMA PROCEDURE USING CRYOVEIN;  Surgeon: Waynetta Sandy, MD;  Location: [place];  Service: Vascular;  Laterality: Left;   GROIN DEBRIDEMENT Left 04/27/2019   Procedure: GROIN DEBRIDEMENT;  Surgeon: Waynetta Sandy, MD;  Location: [place];  Service: Vascular;  Laterality: Left;   INSERTION OF ILIAC STENT  03/30/2019   Procedure: Stent of left common, external iliac veins and left common femoral vein;  Surgeon: Waynetta Sandy, MD;  Location: [place];  Service: Vascular;;   KNEE ARTHROSCOPY WITH MENISCAL REPAIR Left 11/14/2020   Procedure: LEFT KNEE ARTHROSCOPY WITH PARTIAL MEDIAL MENISCECTOMY;  Surgeon: Carole Civil, MD;  Location: [facility];  Service: Orthopedics;  Laterality: Left;   LOWER EXTREMITY VENOGRAPHY N/A 08/17/2018   Procedure: LOWER EXTREMITY VENOGRAPHY - Central Venogram;  Surgeon: Waynetta Sandy, MD;  Location: [place] [facility];  Service: Cardiovascular;  Laterality: N/A;   LOWER EXTREMITY VENOGRAPHY Bilateral  03/09/2019   Procedure: LOWER EXTREMITY VENOGRAPHY;  Surgeon: Waynetta Sandy, MD;  Location: [place] [facility];  Service: Cardiovascular;  Laterality: Bilateral;   LOWER EXTREMITY VENOGRAPHY Left 08/16/2019   Procedure: LOWER EXTREMITY VENOGRAPHY;  Surgeon: Waynetta Sandy, MD;  Location: [place] [facility];  Service: Cardiovascular;  Laterality: Left;   LUMBAR FUSION  11/21/2000   L5-S1   LUMBAR SPINE SURGERY     x 2 others   PATCH ANGIOPLASTY Left 03/30/2019   Procedure: Patch Angioplasty of the Left Common Femoral Vein using Venosure Biologic patch;  Surgeon: Waynetta Sandy, MD;  Location: [place];  Service: Vascular;  Laterality: Left;   PERIPHERAL VASCULAR INTERVENTION Left 08/16/2019   Procedure: PERIPHERAL VASCULAR INTERVENTION;  Surgeon: Waynetta Sandy, MD;  [facility]  IN[facility];  Service: Cardiovascular;  Laterality: Left;  common femoral/femoral vein stent   TRIGGER FINGER RELEASE Right 12/01/2017   Procedure: RELEASE TRIGGER FINGER/A-1 PULLEY RIGHT THUMB;  Surgeon: Leanora Cover, MD;  Location: [place];  Service: Orthopedics;  Laterality: Right;   TRIGGER FINGER RELEASE Left 01/26/2018   Procedure: LEFT TRIGGER THUMB RELEASE;  Surgeon: Leanora Cover, MD;  Location: [place];  Service: Orthopedics;  Laterality: Left;   ULTRASOUND GUIDANCE FOR VASCULAR ACCESS Right 03/30/2019   Procedure: Ultrasound-guided cannulation right internal jugular vein;  Surgeon: Waynetta Sandy, MD;  Location: [place];  Service: Vascular;  Laterality: Right;    Family Psychiatric History: see below  Family History:  Family History  Problem Relation Age of Onset   Heart disease Mother    Hyperlipidemia Mother    Hypertension Mother    Bipolar disorder Mother    Stroke Mother    Depression Mother    Sleep apnea Mother    Obesity Mother    Diabetes Father    Heart disease Father    Hyperlipidemia Father     Hypertension Father    Sleep apnea Father    Obesity Father    Drug abuse Daughter    ADD / ADHD Daughter    Drug abuse Daughter    Anxiety disorder Daughter    Bipolar disorder Daughter    Hypertension Sister    Hypertension Brother    Hyperlipidemia Brother    Heart disease Brother    Bipolar disorder Maternal Aunt    Suicidality Maternal Aunt     Social History:  Social History   Socioeconomic History   Marital status: Divorced    Spouse name: Not on file   Number of children: 3   Years of education: Not on file   Highest education level: Not on file  Occupational History   Not on file  Tobacco Use   Smoking status: Never   Smokeless tobacco: Never  Vaping Use   Vaping Use: Never used  Substance and Sexual Activity   Alcohol use: No   Drug use: No   Sexual activity: Not Currently    Partners: Male    Birth control/protection: Surgical  Other Topics Concern   Not on file  Social History Narrative   Not on file   Social Determinants of Health   Financial Resource Strain: High Risk   Difficulty of Paying Living Expenses: Hard  Food Insecurity: Food Insecurity Present   Worried About Running Out of Food in the Last Year: Sometimes true   Ran Out of Food in the Last Year: Never true  Transportation Needs: No Transportation Needs   Lack of Transportation (Medical): No   Lack of Transportation (Non-Medical): No  Physical Activity: Inactive   Days of Exercise per Week: 0 days   Minutes of Exercise per Session: 0 min  Stress: Stress Concern Present   Feeling of Stress : Very much  Social Connections: Socially Isolated   Frequency of Communication with Friends and Family: More than three times a week   Frequency of Social Gatherings with Friends and Family: Three times a week   Attends Religious Services: Never   Active Member of Clubs or Organizations: No   Attends Archivist Meetings: Never   Marital Status: Divorced    Allergies:  Allergies   Allergen Reactions   Aripiprazole Other (See Comments)    BECOMES  VIOLENT    Seroquel [Quetiapine Fumarate] Other (See  Comments)    BECOMES VIOLENT   Chlorpromazine Other (See Comments)    SEVERE ANXIETY    Gabapentin Other (See Comments)    NIGHTMARES     Metabolic Disorder Labs: Lab Results  Component Value Date   HGBA1C 6.3 (H) 03/22/2021   MPG 134.11 03/26/2019   No results found for: PROLACTIN Lab Results  Component Value Date   CHOL 203 (H) 03/29/2020   TRIG 296 (H) 03/29/2020   HDL 37 (L) 03/29/2020   CHOLHDL 5.0 (H) 10/25/2019   LDLCALC 114 (H) 03/29/2020   LDLCALC 127 (H) 10/25/2019   Lab Results  Component Value Date   TSH 0.107 (L) 05/18/2020   TSH 0.275 (L) 03/29/2020    Therapeutic Level Labs: No results found for: LITHIUM Lab Results  Component Value Date   VALPROATE 61.2 03/23/2014   No components found for:  CBMZ  Current Medications: Current Outpatient Medications  Medication Sig Dispense Refill   FLUoxetine (PROZAC) 20 MG capsule Take 1 capsule (20 mg total) by mouth daily. 30 capsule 2   ALPRAZolam (XANAX) 1 MG tablet Take 1 tablet (1 mg total) by mouth 2 (two) times daily as needed for anxiety. 60 tablet 2   amitriptyline (ELAVIL) 25 MG tablet Take 25 mg by mouth at bedtime.     amphetamine-dextroamphetamine (ADDERALL) 30 MG tablet Take 1 tablet by mouth 2 (two) times daily. 60 tablet 0   aspirin EC 81 MG tablet Take 81 mg by mouth daily.     atorvastatin (LIPITOR) 20 MG tablet Take 1 tablet (20 mg total) by mouth daily. Please schedule office visit before any future refills. 90 tablet 0   cetirizine (ZYRTEC) 10 MG tablet TAKE 1 TABLET BY MOUTH DAILY 90 tablet 1   cycloSPORINE (RESTASIS) 0.05 % ophthalmic emulsion Place 1 drop into both eyes 2 (two) times daily.     Dulaglutide (TRULICITY) [id number] 0000000 SOPN Inject 0.75 mg into the skin once a week. 2 mL 0   FLUoxetine (PROZAC) 40 MG capsule TAKE 1 TABLET BY MOUTH DAILY 90 capsule 2    GARLIC PO Take [id number] mg by mouth daily.     hydrochlorothiazide (HYDRODIURIL) 25 MG tablet Take 1 tablet (25 mg total) by mouth daily. Please schedule office visit before any future refills. 90 tablet 0   HYDROcodone-acetaminophen (NORCO) 10-325 MG tablet Take 1 tablet by mouth every 6 (six) hours as needed.     levothyroxine (SYNTHROID) 125 MCG tablet Take 125 mcg by mouth daily before breakfast.     LINZESS 145 MCG CAPS capsule TAKE ONE CAPSULE BY MOUTH DAILY (Patient taking differently: Take 145 mcg by mouth daily as needed.) 90 capsule 3   methocarbamol (ROBAXIN) 750 MG tablet Take 1 tablet (750 mg total) by mouth 2 (two) times daily as needed for muscle spasms. 60 tablet 2   Multiple Vitamin (MULTIVITAMIN) capsule Take 1 capsule by mouth daily.     Olopatadine HCl 0.2 % SOLN Place 1 drop into both eyes daily.     Omega-3 Fatty Acids (OMEGA-3 FISH OIL PO) Take 600 mg by mouth daily.     pantoprazole (PROTONIX) 40 MG tablet TAKE 1 TABLET BY MOUTH DAILY (Patient taking differently: Take 40 mg by mouth daily.) 90 tablet 3   potassium chloride (KLOR-CON) 10 MEQ tablet TAKE 1 TABLET BY MOUTH THREE TIMES DAILY 90 tablet 0   pregabalin (LYRICA) 100 MG capsule Take 100 mg by mouth 4 (four) times daily.     rOPINIRole (REQUIP) 2  MG tablet TAKE 1 TABLET BY MOUTH TWICE DAILY (Patient taking differently: Take 2 mg by mouth 2 (two) times daily.) 180 tablet 3   sulfamethoxazole-trimethoprim (BACTRIM DS) 800-160 MG tablet Take 1 tablet by mouth every 12 (twelve) hours. 10 tablet 0   triamcinolone cream (KENALOG) 0.1 % Apply topically 2 (two) times daily.     Vibegron (GEMTESA) 75 MG TABS Take 75 mg by mouth daily. 30 tablet 0   warfarin (COUMADIN) 5 MG tablet TAKE 1 AND 1/2 TABLETS BY MOUTH ON TUEDAYS AND THURSDAYS, THEN TAKE 1 TABLET DAILY ON ALL OTHER DAYS 60 tablet 3   No current facility-administered medications for this visit.     Musculoskeletal: Strength & Muscle Tone: na Gait & Station:  na Patient leans: na  Psychiatric Specialty Exam: Review of Systems  Constitutional:  Positive for fatigue.  Psychiatric/Behavioral:  Positive for dysphoric mood. The patient is nervous/anxious.   All other systems reviewed and are negative.  There were no vitals taken for this visit.There is no height or weight on file to calculate BMI.  General Appearance: NA  Eye Contact:  NA  Speech:  Clear and Coherent  Volume:  Decreased  Mood:  Depressed  Affect:  NA  Thought Process:  Goal Directed  Orientation:  Full (Time, Place, and Person)  Thought Content: Rumination   Suicidal Thoughts:  No  Homicidal Thoughts:  No  Memory:  Immediate;   Good Recent;   Good Remote;   Good  Judgement:  Good  Insight:  Fair  Psychomotor Activity:  Decreased  Concentration:  Concentration: Fair and Attention Span: Fair  Recall:  Good  Fund of Knowledge: Good  Language: Good  Akathisia:  No  Handed:  Right  AIMS (if indicated): not done  Assets:  Communication Skills Desire for Improvement Resilience Social Support Talents/Skills  ADL's:  Intact  Cognition: WNL  Sleep:  Fair   Screenings: PHQ2-9    Flowsheet Row Video Visit from 07/12/2021 in [place] Office Visit from 05/31/2021 in [place] Video Visit from 05/29/2021 in [place] Office Visit from 04/10/2021 in [place] Video Visit from 02/26/2021 in [facility]  PHQ-2 Total Score 6 4 0 0 0  PHQ-9 Total Score 16 18 -- -- --      Flowsheet Row Video Visit from 07/12/2021 in [place] [facility] Video Visit from 05/29/2021 in [place] [facility] from 04/12/2021 in [place] No Risk No Risk No Risk        Assessment and Plan: This  patient is a 55 year old female with a history of depression anxiety and ADD.  She has not been doing well recently and has become more depressed.  We will therefore increase Prozac to 60 mg daily for depression.  She will continue Xanax 1 mg twice daily as needed for anxiety and Adderall 30 mg twice daily for ADD.  She will return to see me in 6weeks   [name], MD 07/12/2021, 10:26 AM

## 2021-07-13 ENCOUNTER — Other Ambulatory Visit: Payer: Self-pay | Admitting: Family Medicine

## 2021-07-13 DIAGNOSIS — E119 Type 2 diabetes mellitus without complications: Secondary | ICD-10-CM | POA: Diagnosis not present

## 2021-07-14 DIAGNOSIS — E119 Type 2 diabetes mellitus without complications: Secondary | ICD-10-CM | POA: Diagnosis not present

## 2021-07-15 DIAGNOSIS — E119 Type 2 diabetes mellitus without complications: Secondary | ICD-10-CM | POA: Diagnosis not present

## 2021-07-16 ENCOUNTER — Encounter (INDEPENDENT_AMBULATORY_CARE_PROVIDER_SITE_OTHER): Payer: Self-pay | Admitting: Family Medicine

## 2021-07-16 DIAGNOSIS — E119 Type 2 diabetes mellitus without complications: Secondary | ICD-10-CM | POA: Diagnosis not present

## 2021-07-17 ENCOUNTER — Ambulatory Visit
Payer: Medicaid Other | Attending: Student in an Organized Health Care Education/Training Program | Admitting: Student in an Organized Health Care Education/Training Program

## 2021-07-17 ENCOUNTER — Encounter: Payer: Self-pay | Admitting: Student in an Organized Health Care Education/Training Program

## 2021-07-17 ENCOUNTER — Encounter (HOSPITAL_COMMUNITY): Payer: Self-pay | Admitting: Hematology

## 2021-07-17 ENCOUNTER — Telehealth: Payer: Self-pay | Admitting: Urology

## 2021-07-17 ENCOUNTER — Other Ambulatory Visit: Payer: Self-pay

## 2021-07-17 VITALS — BP 161/88 | HR 99 | Temp 96.6°F | Resp 18 | Ht 70.0 in | Wt 270.0 lb

## 2021-07-17 DIAGNOSIS — M5136 Other intervertebral disc degeneration, lumbar region: Secondary | ICD-10-CM | POA: Insufficient documentation

## 2021-07-17 DIAGNOSIS — Z0289 Encounter for other administrative examinations: Secondary | ICD-10-CM | POA: Insufficient documentation

## 2021-07-17 DIAGNOSIS — M5030 Other cervical disc degeneration, unspecified cervical region: Secondary | ICD-10-CM | POA: Insufficient documentation

## 2021-07-17 DIAGNOSIS — M1712 Unilateral primary osteoarthritis, left knee: Secondary | ICD-10-CM | POA: Diagnosis not present

## 2021-07-17 DIAGNOSIS — M792 Neuralgia and neuritis, unspecified: Secondary | ICD-10-CM | POA: Diagnosis not present

## 2021-07-17 DIAGNOSIS — N3001 Acute cystitis with hematuria: Secondary | ICD-10-CM

## 2021-07-17 DIAGNOSIS — G894 Chronic pain syndrome: Secondary | ICD-10-CM

## 2021-07-17 DIAGNOSIS — M961 Postlaminectomy syndrome, not elsewhere classified: Secondary | ICD-10-CM | POA: Insufficient documentation

## 2021-07-17 DIAGNOSIS — I871 Compression of vein: Secondary | ICD-10-CM

## 2021-07-17 DIAGNOSIS — E1142 Type 2 diabetes mellitus with diabetic polyneuropathy: Secondary | ICD-10-CM | POA: Insufficient documentation

## 2021-07-17 DIAGNOSIS — E119 Type 2 diabetes mellitus without complications: Secondary | ICD-10-CM | POA: Diagnosis not present

## 2021-07-17 MED ORDER — HYDROCODONE-ACETAMINOPHEN 10-325 MG PO TABS: 1.0000 | ORAL_TABLET | Freq: Four times a day (QID) | ORAL | 0 refills | Status: AC | PRN

## 2021-07-17 MED ORDER — GEMTESA 75 MG PO TABS
75.0000 mg | ORAL_TABLET | Freq: Every day | ORAL | 0 refills | Status: DC
Start: 2021-07-17 — End: 2021-08-15

## 2021-07-17 MED ORDER — HYDROCODONE-ACETAMINOPHEN 10-325 MG PO TABS: 1.0000 | ORAL_TABLET | Freq: Four times a day (QID) | ORAL | 0 refills | Status: DC | PRN

## 2021-07-17 NOTE — Telephone Encounter (Signed)
Medication was sent to pharmacy.

## 2021-07-17 NOTE — Progress Notes (Signed)
PROVIDER NOTE: Information contained herein reflects review and annotations entered in association with encounter. Interpretation of such information and data should be left to medically-trained personnel. Information provided to patient can be located elsewhere in the medical record under "Patient Instructions". Document created using STT-dictation technology, any transcriptional errors that may result from process are unintentional.    Patient: Jane [name]  Service Category: E/M  Provider: Gillis Santa, MD  DOB: October 25, 1966  DOS: 07/17/2021  Specialty: Interventional Pain Management  MRN: [id number]  Setting: Ambulatory outpatient  PCP: [name], MD  Type: Established Patient    Referring Provider: [name], MD  Location: Office  Delivery: Face-to-face     HPI  [name], a 55 y.o. year old female, is here today because of her Chronic pain syndrome [G89.4]. [name] primary complain today is Back Pain (lower) Last encounter: My last encounter with her was on 04/10/21 Pertinent problems: [name] has History of DVT (deep vein thrombosis); Bipolar 1 disorder ([place]); Depression, major, recurrent ([place]); Radicular low back pain; May-Thurner syndrome; Iliac vein stenosis, left; Class 2 severe obesity due to excess calories with serious comorbidity and body mass index (BMI) of 38.0 to 38.9 in adult [facility]); Peripheral neuropathy; Chronic low back pain (Bilateral) w/ sciatica (Bilateral); Chronic anticoagulation (Coumadin); CKD stage 3 due to type 2 diabetes mellitus ([place]); Displacement of lumbar intervertebral disc without myelopathy; Chronic pain syndrome; Pharmacologic therapy; Abnormal MRI, cervical spine (01/08/2017); Abnormal MRI, lumbar spine (05/11/2014); DDD (degenerative disc disease), cervical; DDD (degenerative disc disease), lumbar; Failed back surgical syndrome; Diabetic peripheral neuropathy ([place]); Neurogenic pain; and Chronic musculoskeletal pain on their  pertinent problem list. Pain Assessment: Severity of Chronic pain is reported as a 8 /10. Location: Back Lower/left leg to the foot. Onset: More than a month ago. Quality: Constant, Pressure, Throbbing. Timing: Constant. Modifying factor(s): heat, rest, medications. Vitals:  height is $RemoveB'5\' 10"'TMfaUFiO$  (1.778 m) and weight is 270 lb (122.5 kg). Her temporal temperature is 96.6 F (35.9 C) (abnormal). Her blood pressure is 161/88 (abnormal) and her pulse is 99. Her respiration is 18 and oxygen saturation is 98%.   Reason for encounter: medication management.     Patient follows up today for medication management.  She does have 2 areas of bruising that happened spontaneously.  She is anticoagulated on Coumadin given her May Thurner syndrome.  We discussed Qutenza, capsaicin 8% topical treatment for bilateral foot pain related to painful diabetic neuropathy.  Patient states that she would like to consider this.  We will see if this will be covered by insurance.  Otherwise I will refill her hydrocodone as below.  No change in dose.  Patient cautioned on Xanax intake with chronic opioid medications.  She was instructed not to take Xanax within 4 hours of opioid intake.  Can continue Robaxin, amitriptyline as prescribed.  States that her Prozac was increased to 60 mg which she finds beneficial.   Pharmacotherapy Assessment   06/23/2021  04/10/2021   1  Hydrocodone-Acetamin 10-325 Mg 120.00  30  [place]  [id number]  [place] (0252)  0/0  40.00 MME  Medicaid  [place]    Analgesic: Hydrocodone 10 mg 4 times daily as needed, quantity 120/month; MME equals 40   Monitoring: [place] PMP: PDMP reviewed during this encounter.       Pharmacotherapy: No side-effects or adverse reactions reported. Compliance: No problems identified. Effectiveness: Clinically acceptable.  UDS:  Summary  Date Value Ref Range Status  04/10/2021 Note  Final  Comment:    ==================================================================== [employer]  Select 13 (MW) ==================================================================== Test                             Result       Flag       Units  Drug Present and Declared for Prescription Verification   Amphetamine                    6176         EXPECTED   ng/mg creat    Amphetamine is available as a schedule II prescription drug.    Hydrocodone                    2716         EXPECTED   ng/mg creat   Hydromorphone                  62           EXPECTED   ng/mg creat   Dihydrocodeine                 311          EXPECTED   ng/mg creat   Norhydrocodone                 1278         EXPECTED   ng/mg creat    Sources of hydrocodone include scheduled prescription medications.    Hydromorphone, dihydrocodeine and norhydrocodone are expected    metabolites of hydrocodone. Hydromorphone and dihydrocodeine are    also available as scheduled prescription medications.  Drug Absent but Declared for Prescription Verification   Alprazolam                     Not Detected UNEXPECTED ng/mg creat ==================================================================== Test                      Result    Flag   Units      Ref Range   Creatinine              82               mg/dL      >=20 ==================================================================== Declared Medications:  The flagging and interpretation on this report are based on the  following declared medications.  Unexpected results may arise from  inaccuracies in the declared medications.   **Note: The testing scope of this panel includes these medications:   Alprazolam (Xanax)  Amphetamine (Adderall)  Hydrocodone (Norco)   **Note: The testing scope of this panel does not include the  following reported medications:   Acetaminophen (Norco)  Aspirin  Atorvastatin (Lipitor)  Cetirizine (Zyrtec)  Eye Drops  Fluoxetine (Prozac)  Hydrochlorothiazide (Hydrodiuril)  Linaclotide (Linzess)  Methocarbamol (Robaxin)  Multivitamin   Omega-3 Fatty Acids  Pantoprazole (Protonix)  Potassium (Klor-Con)  Pregabalin (Lyrica)  Ropinirole (Requip)  Supplement  Warfarin (Coumadin) ==================================================================== For clinical consultation, please call [phone]. ====================================================================       ROS  Constitutional: Denies any fever or chills Gastrointestinal: No reported hemesis, hematochezia, vomiting, or acute GI distress Musculoskeletal:  Left knee pain  bilateral leg pain, calf pain, right greater than left, bilateral foot pain Neurological: No reported episodes of acute onset apraxia, aphasia, dysarthria, agnosia, amnesia, paralysis, loss of coordination, or loss of consciousness  Medication Review  ALPRAZolam, Dulaglutide, FLUoxetine,  Garlic, HYDROcodone-acetaminophen, Olopatadine HCl, Omega-3 Fatty Acids, Vibegron, amitriptyline, amphetamine-dextroamphetamine, aspirin EC, atorvastatin, cetirizine, cycloSPORINE, hydrochlorothiazide, levothyroxine, linaclotide, methocarbamol, multivitamin, pantoprazole, potassium chloride, pregabalin, rOPINIRole, sulfamethoxazole-trimethoprim, triamcinolone cream, and warfarin  History Review  Allergy: [name] is allergic to aripiprazole, seroquel [quetiapine fumarate], chlorpromazine, and gabapentin. Drug: [name]  reports no history of drug use. Alcohol:  reports no history of alcohol use. Tobacco:  reports that she has never smoked. She has never used smokeless tobacco. Social: [name]  reports that she has never smoked. She has never used smokeless tobacco. She reports that she does not drink alcohol and does not use drugs. Medical:  has a past medical history of ADD (attention deficit disorder), Anxiety, Back pain, Bilateral swelling of feet, Bipolar 1 disorder ([place]), Bipolar 1 disorder ([place]), Bipolar disorder ([place]), Chewing difficulty, Chronic fatigue syndrome, Chronic kidney  disease, Constipation, Depression, Diabetes mellitus without complication ([place]), Dyspnea, GERD (gastroesophageal reflux disease), Headache, High cholesterol, History of blood clots, History of DVT (deep vein thrombosis), Hypertension, Hypothyroidism, Joint pain, Neuropathy, Obsessive-compulsive disorder, Peripheral vascular disease ([place]), Prediabetes, Respiratory failure requiring intubation ([place]), Restless leg syndrome, Shortness of breath, Sleep apnea, Thrombocytopenia ([place]) (09/15/2019), Trigger thumb of left hand (01/2018), and Trigger thumb of right hand. Surgical: [name]  has a past surgical history that includes Cholecystectomy; Trigger finger release (Right, 12/01/2017); Abdominal hysterectomy (06/2016); Lumbar fusion (11/21/2000); Lumbar spine surgery; Trigger finger release (Left, 01/26/2018); LOWER EXTREMITY VENOGRAPHY (N/A, 08/17/2018); Femoral-femoral Bypass Graft (Left, 11/24/2018); AV fistula placement (Left, 11/24/2018); LOWER EXTREMITY VENOGRAPHY (Bilateral, 03/09/2019); Patch angioplasty (Left, 03/30/2019); Ultrasound guidance for vascular access (Right, 03/30/2019); Femoral artery debridement (03/30/2019); Insertion of iliac stent (03/30/2019); Groin debridement (Left, 04/27/2019); Application if wound vac (Left, 04/27/2019); LOWER EXTREMITY VENOGRAPHY (Left, 08/16/2019); PERIPHERAL VASCULAR INTERVENTION (Left, 08/16/2019); Back surgery; Colonoscopy with propofol (N/A, 11/22/2019); and Knee arthroscopy with meniscal repair (Left, 11/14/2020). Family: family history includes ADD / ADHD in her daughter; Anxiety disorder in her daughter; Bipolar disorder in her daughter, maternal aunt, and mother; Depression in her mother; Diabetes in her father; Drug abuse in her daughter and daughter; Heart disease in her brother, father, and mother; Hyperlipidemia in her brother, father, and mother; Hypertension in her brother, father, mother, and sister; Obesity in her father and mother; Sleep apnea in her father and  mother; Stroke in her mother; Suicidality in her maternal aunt.  Laboratory Chemistry Profile   Renal Lab Results  Component Value Date   BUN 33 (H) 04/12/2021   CREATININE 1.23 (H) 04/12/2021   BCR 21 03/22/2021   GFRAA 53 (L) 07/18/2020   GFRNONAA 52 (L) 04/12/2021     Hepatic Lab Results  Component Value Date   AST 17 04/12/2021   ALT 20 04/12/2021   ALBUMIN 3.8 04/12/2021   ALKPHOS 95 04/12/2021     Electrolytes Lab Results  Component Value Date   NA 140 04/12/2021   K 3.8 04/12/2021   CL 101 04/12/2021   CALCIUM 9.5 04/12/2021   MG 2.2 04/04/2019   PHOS 4.0 04/04/2019     Bone Lab Results  Component Value Date   VD25OH 50.60 01/01/2021     Inflammation (CRP: Acute Phase) (ESR: Chronic Phase) Lab Results  Component Value Date   CRP 11 (H) 02/16/2020   ESRSEDRATE 33 02/16/2020   LATICACIDVEN 1.2 01/05/2020       Note: Above Lab results reviewed.  Recent Imaging Review  DG Knee AP/LAT W/[place] Right Right knee pain acute times of months  Getting better  Normal alignment  No significant degenerative changes  Impression normal right knee Note: Reviewed        Physical Exam  General appearance: Well nourished, well developed, and well hydrated. In no apparent acute distress Mental status: Alert, oriented x 3 (person, place, & time)       Respiratory: No evidence of acute respiratory distress Eyes: PERLA Vitals: BP (!) 161/88   Pulse 99   Temp (!) 96.6 F (35.9 C) (Temporal)   Resp 18   Ht $R'5\' 10"'Ih$  (1.778 m)   Wt 270 lb (122.5 kg)   LMP  (LMP Unknown)   SpO2 98%   BMI 38.74 kg/m  BMI: Estimated body mass index is 38.74 kg/m as calculated from the following:   Height as of this encounter: $RemoveBeforeD'5\' 10"'KHzABVRxjwYQYe$  (1.778 m).   Weight as of this encounter: 270 lb (122.5 kg). Ideal: Ideal body weight: 68.5 kg (151 lb 0.2 oz) Adjusted ideal body weight: 90.1 kg (198 lb 9.7 oz)  Lumbar Spine Area Exam  Skin & Axial Inspection: Well healed scar from previous  spine surgery detected Alignment: Asymmetric Functional ROM: Decreased ROM       Stability: No instability detected Muscle Tone/Strength: Functionally intact. No obvious neuro-muscular anomalies detected. Sensory (Neurological): Dermatomal pain pattern   Provocative Tests: Hyperextension/rotation test: (+) bilaterally for facet joint pain. Lumbar quadrant test (Kemp's test): (+) bilaterally for facet joint pain.     Gait & Posture Assessment  Ambulation: Limited Gait: Antalgic gait (limping) Posture: Difficulty standing up straight, due to pain  Lower Extremity Exam      Side: Right lower extremity   Side: Left lower extremity  Stability: No instability observed           Stability: No instability observed          Skin & Extremity Inspection: Skin color, temperature, and hair growth are WNL. No peripheral edema or cyanosis. No masses, redness, swelling, asymmetry, or associated skin lesions. No contractures.   Skin & Extremity Inspection: Skin color, temperature, and hair growth are WNL. No peripheral edema or cyanosis. No masses, redness, swelling, asymmetry, or associated skin lesions. No contractures.  Functional ROM: Decreased ROM for hip and knee joints           Functional ROM: Decreased ROM for hip and knee joints, pain with weight bearing and lateral rotation          Muscle Tone/Strength: Functionally intact. No obvious neuro-muscular anomalies detected.   Muscle Tone/Strength: Functionally intact. No obvious neuro-muscular anomalies detected.  Sensory (Neurological): Neurogenic pain pattern        Sensory (Neurological):  Arthropathic arthralgia for left knee severe pain with weightbearing        DTR: Patellar: deferred today Achilles: deferred today Plantar: deferred today   DTR: Patellar: deferred today Achilles: deferred today Plantar: deferred today  Palpation: No palpable anomalies   Palpation: No palpable anomalies    Assessment   Status Diagnosis   Controlled Controlled Controlled 1. Chronic pain syndrome   2. Failed back surgical syndrome   3. Neurogenic pain   4. DDD (degenerative disc disease), lumbar   5. Pain management contract signed   6. Arthritis of left knee   7. May-Thurner syndrome   8. DDD (degenerative disc disease), cervical   9. Diabetic peripheral neuropathy [facility])        Plan of Care  [name] has a current medication list which includes the following long-term medication(s): amitriptyline, amphetamine-dextroamphetamine, atorvastatin, cetirizine, fluoxetine, fluoxetine, hydrochlorothiazide, levothyroxine,  linzess, pantoprazole, potassium chloride, ropinirole, and warfarin.  Pharmacotherapy (Medications Ordered): Meds ordered this encounter  Medications   HYDROcodone-acetaminophen (NORCO) 10-325 MG tablet    Sig: Take 1 tablet by mouth every 6 (six) hours as needed. To last 30 days from fill date    Dispense:  120 tablet    Refill:  0   HYDROcodone-acetaminophen (NORCO) 10-325 MG tablet    Sig: Take 1 tablet by mouth every 6 (six) hours as needed. To last 30 days from fill date    Dispense:  120 tablet    Refill:  0   HYDROcodone-acetaminophen (NORCO) 10-325 MG tablet    Sig: Take 1 tablet by mouth every 6 (six) hours as needed. To last 30 days from fill date    Dispense:  120 tablet    Refill:  0   Qutenza, capsaicin 8% topical treatment for painful diabetic neuropathy of bilateral feet. 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    Answer:   [facility]     Follow-up plan:   Return in about 3 months (around 10/16/2021) for Medication Management, in person.   Recent Visits No visits were found meeting these  conditions. Showing recent visits within past 90 days and meeting all other requirements Today's Visits Date Type Provider Dept  07/17/21 Office Visit Gillis Santa, MD [facility]  Showing today's visits and meeting all other requirements Future Appointments No visits were found meeting these conditions. Showing future appointments within next 90 days and meeting all other requirements I discussed the assessment and treatment plan with the patient. The patient was provided an opportunity to ask questions and all were answered. The patient agreed with the plan and demonstrated an understanding of the instructions.  Patient advised to call back or seek an in-person evaluation if the symptoms or condition worsens.  Duration of encounter: 16minutes.  Note by: Gillis Santa, MD Date: 07/17/2021; Time: 11:24 AM

## 2021-07-17 NOTE — Telephone Encounter (Signed)
Patient came in and would like more samples of Gemtesa or a prescription for this medication. She is not scheduled to see Dr. Erlene Quan until 08/07/2021. Her pharmacy is [facility] in [place], [place].

## 2021-07-17 NOTE — Telephone Encounter (Signed)
Prior authorization has been started for Trulicity through [employer]. Per [employer] medicaid it needs further review. Informed to call back in 24 hours for further information and status. [place] [id number] and reference # [id number]

## 2021-07-17 NOTE — Patient Instructions (Signed)

## 2021-07-17 NOTE — Progress Notes (Signed)
Nursing Pain Medication Assessment:  Safety precautions to be maintained throughout the outpatient stay will include: orient to surroundings, keep bed in low position, maintain call bell within reach at all times, provide assistance with transfer out of bed and ambulation.  Medication Inspection Compliance: Pill count conducted under aseptic conditions, in front of the patient. Neither the pills nor the bottle was removed from the patient's sight at any time. Once count was completed pills were immediately returned to the patient in their original bottle.  Medication: See above Pill/Patch Count:  23 of 120 pills remain Pill/Patch Appearance: Markings consistent with prescribed medication Bottle Appearance: Standard pharmacy container. Clearly labeled. Filled Date: 08 / 20 / 2022 Last Medication intake:  Today

## 2021-07-17 NOTE — Telephone Encounter (Signed)
Pt last seen by Dr. Ukleja.  

## 2021-07-17 NOTE — Addendum Note (Signed)
Addended by: Alvera Novel on: 07/17/2021 02:20 PM   Modules accepted: Orders

## 2021-07-17 NOTE — Telephone Encounter (Signed)
Please advise 

## 2021-07-18 DIAGNOSIS — R0982 Postnasal drip: Secondary | ICD-10-CM | POA: Diagnosis not present

## 2021-07-18 DIAGNOSIS — E119 Type 2 diabetes mellitus without complications: Secondary | ICD-10-CM | POA: Diagnosis not present

## 2021-07-18 DIAGNOSIS — R070 Pain in throat: Secondary | ICD-10-CM | POA: Diagnosis not present

## 2021-07-18 DIAGNOSIS — L03032 Cellulitis of left toe: Secondary | ICD-10-CM | POA: Diagnosis not present

## 2021-07-18 NOTE — Telephone Encounter (Signed)
PA for Jane [name] has been approved  approval [id number] has been approved for one year-pharmacy aware  [id number]

## 2021-07-19 DIAGNOSIS — E119 Type 2 diabetes mellitus without complications: Secondary | ICD-10-CM | POA: Diagnosis not present

## 2021-07-20 DIAGNOSIS — E119 Type 2 diabetes mellitus without complications: Secondary | ICD-10-CM | POA: Diagnosis not present

## 2021-07-21 DIAGNOSIS — E119 Type 2 diabetes mellitus without complications: Secondary | ICD-10-CM | POA: Diagnosis not present

## 2021-07-22 DIAGNOSIS — E119 Type 2 diabetes mellitus without complications: Secondary | ICD-10-CM | POA: Diagnosis not present

## 2021-07-23 ENCOUNTER — Encounter (INDEPENDENT_AMBULATORY_CARE_PROVIDER_SITE_OTHER): Payer: Self-pay

## 2021-07-23 DIAGNOSIS — E039 Hypothyroidism, unspecified: Secondary | ICD-10-CM | POA: Diagnosis not present

## 2021-07-23 DIAGNOSIS — E119 Type 2 diabetes mellitus without complications: Secondary | ICD-10-CM | POA: Diagnosis not present

## 2021-07-23 NOTE — Telephone Encounter (Unsigned)
Per [employer], Trulicity has been denied. Patient has to have tried formulary medications. Reference # [id number]. Patient has been notified via mychart.

## 2021-07-24 DIAGNOSIS — E119 Type 2 diabetes mellitus without complications: Secondary | ICD-10-CM | POA: Diagnosis not present

## 2021-07-25 DIAGNOSIS — E119 Type 2 diabetes mellitus without complications: Secondary | ICD-10-CM | POA: Diagnosis not present

## 2021-07-26 DIAGNOSIS — E119 Type 2 diabetes mellitus without complications: Secondary | ICD-10-CM | POA: Diagnosis not present

## 2021-07-26 DIAGNOSIS — M7989 Other specified soft tissue disorders: Secondary | ICD-10-CM | POA: Diagnosis not present

## 2021-07-27 DIAGNOSIS — E119 Type 2 diabetes mellitus without complications: Secondary | ICD-10-CM | POA: Diagnosis not present

## 2021-07-28 DIAGNOSIS — E119 Type 2 diabetes mellitus without complications: Secondary | ICD-10-CM | POA: Diagnosis not present

## 2021-07-29 DIAGNOSIS — E119 Type 2 diabetes mellitus without complications: Secondary | ICD-10-CM | POA: Diagnosis not present

## 2021-07-30 DIAGNOSIS — E119 Type 2 diabetes mellitus without complications: Secondary | ICD-10-CM | POA: Diagnosis not present

## 2021-07-31 ENCOUNTER — Ambulatory Visit: Payer: Medicaid Other | Admitting: Urology

## 2021-07-31 DIAGNOSIS — E119 Type 2 diabetes mellitus without complications: Secondary | ICD-10-CM | POA: Diagnosis not present

## 2021-08-01 DIAGNOSIS — E119 Type 2 diabetes mellitus without complications: Secondary | ICD-10-CM | POA: Diagnosis not present

## 2021-08-02 ENCOUNTER — Ambulatory Visit (INDEPENDENT_AMBULATORY_CARE_PROVIDER_SITE_OTHER): Payer: Self-pay | Admitting: Family Medicine

## 2021-08-02 DIAGNOSIS — E119 Type 2 diabetes mellitus without complications: Secondary | ICD-10-CM | POA: Diagnosis not present

## 2021-08-03 DIAGNOSIS — E119 Type 2 diabetes mellitus without complications: Secondary | ICD-10-CM | POA: Diagnosis not present

## 2021-08-04 DIAGNOSIS — E119 Type 2 diabetes mellitus without complications: Secondary | ICD-10-CM | POA: Diagnosis not present

## 2021-08-05 DIAGNOSIS — E119 Type 2 diabetes mellitus without complications: Secondary | ICD-10-CM | POA: Diagnosis not present

## 2021-08-06 DIAGNOSIS — E119 Type 2 diabetes mellitus without complications: Secondary | ICD-10-CM | POA: Diagnosis not present

## 2021-08-06 NOTE — Progress Notes (Signed)
08/07/21 11:46 AM   [name] November Dec 12, 1965 [id number]  Referring provider:  [name], [place],  [place] [id number] Chief Complaint  Patient presents with   Follow-up    27mo w/UA & Pelvic     HPI: Jane [name] is a 55 y.o.female with a personal history of chronic kidney disease stage III, acute cystitis and OAB, who presents today for 1 month follow-up with UA and pelvic exam.   She was initially evaluated for UTI. Urine grew Klebsiella pneumoniae , she was treated with antibiotics.   She was started on Gemtesa 75 mg.  She is accompanied by her friend today. She reports that her urinary symptoms have improved with the antibiotics as well as the above medication.  She feels like she is not having as much leakage, both leakage with laughing coughing sneezing as well as urgency frequency.  She is in a dramatic improvement.  She is not 100% better but feels a lot more satisfied with her urination.  Urine today showed 11-30 RBCs, >10 epithelial cell suggestive of contamination. She is agreeable with catheterization to obtain a clear catch sample.   She reports that she has not seen any blood in her urine. She reports that she was told that there is blood in her urine since she was in her teens. She had a hysterectomy.    PMH: Past Medical History:  Diagnosis Date   ADD (attention deficit disorder)    Anxiety    Back pain    Bilateral swelling of feet    Bipolar 1 disorder (HCC)    Bipolar 1 disorder (HCC)    Bipolar disorder (HCC)    Chewing difficulty    Chronic fatigue syndrome    Chronic kidney disease    Stage 3 kidney disease;dx by Dr. Sinda Du.    Constipation    Depression    Diabetes mellitus without complication (HCC)    diet controlled   Dyspnea    with exertion   GERD (gastroesophageal reflux disease)    Headache    migraines   High cholesterol    History of blood clots    History of DVT (deep vein  thrombosis)    left leg   Hypertension    states under control with meds., has been on med. x 2 years   Hypothyroidism    Joint pain    Neuropathy    Obsessive-compulsive disorder    Peripheral vascular disease ([place])    Prediabetes    Respiratory failure requiring intubation ([place])    Restless leg syndrome    Shortness of breath    Sleep apnea    Thrombocytopenia ([place]) 09/15/2019   Trigger thumb of left hand 01/2018   Trigger thumb of right hand     Surgical History: Past Surgical History:  Procedure Laterality Date   ABDOMINAL HYSTERECTOMY  06/2016   complete   APPLICATION OF WOUND VAC Left 04/27/2019   Procedure: APPLICATION OF WOUND VAC LEFT GROIN;  Surgeon: Waynetta Sandy, MD;  Location: [facility];  Service: Vascular;  Laterality: Left;   AV FISTULA PLACEMENT Left 11/24/2018   Procedure: ARTERIOVENOUS (AV) FISTULA CREATION LEFT SFA TO LEFT FEMORAL VEIN;  Surgeon: Waynetta Sandy, MD;  Location: [place];  Service: Vascular;  Laterality: Left;   BACK SURGERY     CHOLECYSTECTOMY     COLONOSCOPY WITH PROPOFOL N/A 11/22/2019   Procedure: COLONOSCOPY WITH PROPOFOL;  Surgeon: [name], MD;  Location: [facility]  ENDOSCOPY;  Service: Gastroenterology;  Laterality: N/A;   FEMORAL ARTERY EXPLORATION  03/30/2019   Procedure: Left Common Femoral Artery and Vein Exploration;  Surgeon: Waynetta Sandy, MD;  Location: [facility];  Service: Vascular;;   FEMORAL-FEMORAL BYPASS GRAFT Left 11/24/2018   Procedure: BYPASS GRAFT FEMORAL-FEMORAL VENOUS LEFT TO RIGHT PALMA PROCEDURE USING CRYOVEIN;  Surgeon: Waynetta Sandy, MD;  Location: [place];  Service: Vascular;  Laterality: Left;   GROIN DEBRIDEMENT Left 04/27/2019   Procedure: GROIN DEBRIDEMENT;  Surgeon: Waynetta Sandy, MD;  Location: [facility];  Service: Vascular;  Laterality: Left;   INSERTION OF ILIAC STENT  03/30/2019   Procedure: Stent of left common, external iliac veins and left common femoral vein;  Surgeon:  Waynetta Sandy, MD;  Location: [place];  Service: Vascular;;   KNEE ARTHROSCOPY WITH MENISCAL REPAIR Left 11/14/2020   Procedure: LEFT KNEE ARTHROSCOPY WITH PARTIAL MEDIAL MENISCECTOMY;  Surgeon: Carole Civil, MD;  Location: [facility];  Service: Orthopedics;  Laterality: Left;   LOWER EXTREMITY VENOGRAPHY N/A 08/17/2018   Procedure: LOWER EXTREMITY VENOGRAPHY - Central Venogram;  Surgeon: Waynetta Sandy, MD;  Location: [facility];  Service: Cardiovascular;  Laterality: N/A;   LOWER EXTREMITY VENOGRAPHY Bilateral 03/09/2019   Procedure: LOWER EXTREMITY VENOGRAPHY;  Surgeon: Waynetta Sandy, MD;  Location: [facility];  Service: Cardiovascular;  Laterality: Bilateral;   LOWER EXTREMITY VENOGRAPHY Left 08/16/2019   Procedure: LOWER EXTREMITY VENOGRAPHY;  Surgeon: Waynetta Sandy, MD;  Location: [facility] [place] [facility];  Service: Cardiovascular;  Laterality: Left;   LUMBAR FUSION  11/21/2000   L5-S1   LUMBAR SPINE SURGERY     x 2 others   PATCH ANGIOPLASTY Left 03/30/2019   Procedure: Patch Angioplasty of the Left Common Femoral Vein using Venosure Biologic patch;  Surgeon: Waynetta Sandy, MD;  Location: [facility];  Service: Vascular;  Laterality: Left;   PERIPHERAL VASCULAR INTERVENTION Left 08/16/2019   Procedure: PERIPHERAL VASCULAR INTERVENTION;  Surgeon: Waynetta Sandy, MD;  Location: [facility];  Service: Cardiovascular;  Laterality: Left;  common femoral/femoral vein stent   TRIGGER FINGER RELEASE Right 12/01/2017   Procedure: RELEASE TRIGGER FINGER/A-1 PULLEY RIGHT THUMB;  Surgeon: Leanora Cover, MD;  Location: [facility];  Service: Orthopedics;  Laterality: Right;   TRIGGER FINGER RELEASE Left 01/26/2018   Procedure: LEFT TRIGGER THUMB RELEASE;  Surgeon: Leanora Cover, MD;  Location: [facility];  Service: Orthopedics;  Laterality: Left;   ULTRASOUND GUIDANCE FOR VASCULAR ACCESS Right  03/30/2019   Procedure: Ultrasound-guided cannulation right internal jugular vein;  Surgeon: Waynetta Sandy, MD;  Location: [facility];  Service: Vascular;  Laterality: Right;    Home Medications:  Allergies as of 08/07/2021       Reactions   Aripiprazole Other (See Comments)   BECOMES  VIOLENT   Seroquel [quetiapine Fumarate] Other (See Comments)   BECOMES VIOLENT   Chlorpromazine Other (See Comments)   SEVERE ANXIETY   Gabapentin Other (See Comments)   NIGHTMARES        Medication List        Accurate as of August 07, 2021 11:46 AM. If you have any questions, ask your nurse or doctor.          STOP taking these medications    sulfamethoxazole-trimethoprim 800-160 MG tablet Commonly known as: BACTRIM DS Stopped by: Hollice Espy, MD       TAKE these medications    ALPRAZolam 1 MG  tablet Commonly known as: Xanax Take 1 tablet (1 mg total) by mouth 2 (two) times daily as needed for anxiety.   amitriptyline 25 MG tablet Commonly known as: ELAVIL Take 25 mg by mouth at bedtime.   amphetamine-dextroamphetamine 30 MG tablet Commonly known as: Adderall Take 1 tablet by mouth 2 (two) times daily.   aspirin EC 81 MG tablet Take 81 mg by mouth daily.   atorvastatin 20 MG tablet Commonly known as: LIPITOR Take 1 tablet (20 mg total) by mouth daily. Please schedule office visit before any future refills.   cetirizine 10 MG tablet Commonly known as: ZYRTEC TAKE 1 TABLET BY MOUTH DAILY   cycloSPORINE 0.05 % ophthalmic emulsion Commonly known as: RESTASIS Place 1 drop into both eyes 2 (two) times daily.   FLUoxetine 40 MG capsule Commonly known as: PROZAC TAKE 1 TABLET BY MOUTH DAILY   FLUoxetine 20 MG capsule Commonly known as: PROZAC Take 1 capsule (20 mg total) by mouth daily.   GARLIC PO Take 9,024 mg by mouth daily.   Gemtesa 75 MG Tabs Generic drug: Vibegron Take 75 mg by mouth daily.   hydrochlorothiazide 25 MG tablet Commonly known  as: HYDRODIURIL Take 1 tablet (25 mg total) by mouth daily. Please schedule office visit before any future refills.   HYDROcodone-acetaminophen 10-325 MG tablet Commonly known as: NORCO Take 1 tablet by mouth every 6 (six) hours as needed. To last 30 days from fill date   HYDROcodone-acetaminophen 10-325 MG tablet Commonly known as: NORCO Take 1 tablet by mouth every 6 (six) hours as needed. To last 30 days from fill date Start taking on: August 22, 2021   HYDROcodone-acetaminophen 10-325 MG tablet Commonly known as: NORCO Take 1 tablet by mouth every 6 (six) hours as needed. To last 30 days from fill date Start taking on: September 21, 2021   levothyroxine 125 MCG tablet Commonly known as: SYNTHROID Take 125 mcg by mouth daily before breakfast.   Linzess 145 MCG Caps capsule Generic drug: linaclotide TAKE ONE CAPSULE BY MOUTH DAILY What changed:  how much to take when to take this reasons to take this   methocarbamol 750 MG tablet Commonly known as: ROBAXIN Take 1 tablet (750 mg total) by mouth 2 (two) times daily as needed for muscle spasms.   multivitamin capsule Take 1 capsule by mouth daily.   Olopatadine HCl 0.2 % Soln Place 1 drop into both eyes daily.   OMEGA-3 FISH OIL PO Take 600 mg by mouth daily.   pantoprazole 40 MG tablet Commonly known as: PROTONIX TAKE 1 TABLET BY MOUTH DAILY   potassium chloride 10 MEQ tablet Commonly known as: KLOR-CON TAKE 1 TABLET BY MOUTH THREE TIMES DAILY   pregabalin 100 MG capsule Commonly known as: LYRICA Take 100 mg by mouth 4 (four) times daily.   rOPINIRole 2 MG tablet Commonly known as: REQUIP TAKE 1 TABLET BY MOUTH TWICE DAILY   triamcinolone cream 0.1 % Commonly known as: KENALOG Apply topically 2 (two) times daily.   Trulicity 0.97 DZ/3.2DJ Sopn Generic drug: Dulaglutide Inject 0.75 mg into the skin once a week.   warfarin 5 MG tablet Commonly known as: COUMADIN Take as directed by the anticoagulation  clinic. If you are unsure how to take this medication, talk to your nurse or doctor. Original instructions: TAKE 1 AND 1/2 TABLETS BY MOUTH ON TUEDAYS AND THURSDAYS, THEN TAKE 1 TABLET DAILY ON ALL OTHER DAYS What changed: See the new instructions.  Allergies:  Allergies  Allergen Reactions   Aripiprazole Other (See Comments)    BECOMES  VIOLENT    Seroquel [Quetiapine Fumarate] Other (See Comments)    BECOMES VIOLENT   Chlorpromazine Other (See Comments)    SEVERE ANXIETY    Gabapentin Other (See Comments)    NIGHTMARES     Family History: Family History  Problem Relation Age of Onset   Heart disease Mother    Hyperlipidemia Mother    Hypertension Mother    Bipolar disorder Mother    Stroke Mother    Depression Mother    Sleep apnea Mother    Obesity Mother    Diabetes Father    Heart disease Father    Hyperlipidemia Father    Hypertension Father    Sleep apnea Father    Obesity Father    Drug abuse Daughter    ADD / ADHD Daughter    Drug abuse Daughter    Anxiety disorder Daughter    Bipolar disorder Daughter    Hypertension Sister    Hypertension Brother    Hyperlipidemia Brother    Heart disease Brother    Bipolar disorder Maternal Aunt    Suicidality Maternal Aunt     Social History:  reports that she has never smoked. She has never used smokeless tobacco. She reports that she does not drink alcohol and does not use drugs.   Physical Exam: BP (!) 163/92   Pulse 83   Ht 5\' 10"  (1.778 m)   Wt 281 lb (127.5 kg)   LMP  (LMP Unknown)   BMI 40.32 kg/m   Constitutional:  Alert and oriented, No acute distress. HEENT: [place] AT, moist mucus membranes.  Trachea midline, no masses. Cardiovascular: No clubbing, cyanosis, or edema. Respiratory: Normal respiratory effort, no increased work of breathing. GU: No CVA tenderness Pelvic exam: Exam chaperoned by Fonnie Jarvis.  Petechia and atrophy around the urethra, somewhat patent and mobile. Decent vaginal  support. Skin: No rashes, bruises or suspicious lesions. Neurologic: Grossly intact, no focal deficits, moving all 4 extremities. Psychiatric: Normal mood and affect.  Laboratory Data: Initial urinalysis showed 11-30 WBCs but many epithelials.  Patient was catheterized using a 16 French red rubber catheter at the time of pelvic exam using standard sterile technique.  This indicated the same urinalysis but without epithelial cells.  Microscopic hematuria persisted.  Assessment & Plan:   Overactive bladder, urgency/frequency  - Symptoms improving on Gemtesa  - Continue Gemtesa 75 mg.  Microscopic hematuria  - Urine no longer infected but does show microscopic blood,  however urine looks contaminated today  - Obtained a catheterized urine specimen to help decide whether or not she needs microscopic evaluation -Repeat catheterized urinalysis in fact indicated persistent microscopic blood -We discussed pursuing cystoscopy to evaluate the bladder rule out underlying conditions -Generally, would also recommend CT urogram however she did have a CT of the abdomen pelvis back in June with contrast which showed normal kidneys.  As such, will defer further imaging.  F/u cysto  I,Kailey Littlejohn,acting as a scribe for Hollice Espy, MD.,have documented all relevant documentation on the behalf of Hollice Espy, MD,as directed by  Hollice Espy, MD while in the presence of Hollice Espy, MD.  I have reviewed the above documentation for accuracy and completeness, and I agree with the above.   Hollice Espy, MD   [facility] [address] [place], [place] [address] [phone]

## 2021-08-07 ENCOUNTER — Other Ambulatory Visit: Payer: Self-pay

## 2021-08-07 ENCOUNTER — Encounter: Payer: Self-pay | Admitting: Urology

## 2021-08-07 ENCOUNTER — Ambulatory Visit (INDEPENDENT_AMBULATORY_CARE_PROVIDER_SITE_OTHER): Payer: Medicaid Other | Admitting: Urology

## 2021-08-07 VITALS — BP 163/92 | HR 83 | Ht 70.0 in | Wt 281.0 lb

## 2021-08-07 DIAGNOSIS — E119 Type 2 diabetes mellitus without complications: Secondary | ICD-10-CM | POA: Diagnosis not present

## 2021-08-07 DIAGNOSIS — R3129 Other microscopic hematuria: Secondary | ICD-10-CM

## 2021-08-07 DIAGNOSIS — N3001 Acute cystitis with hematuria: Secondary | ICD-10-CM

## 2021-08-07 DIAGNOSIS — N3946 Mixed incontinence: Secondary | ICD-10-CM

## 2021-08-07 NOTE — Progress Notes (Signed)
In and Out Catheterization  Patient is present today for a I & O catheterization. Patient was cleaned and prepped in a sterile fashion with betadine . A 14FR red rubber cath was inserted no complications were noted , 30ml of urine return was noted, urine was yellow in color. A clean urine sample was collected for UA. Bladder was drained  And catheter was removed with out difficulty.    Preformed by: Fonnie Jarvis, CMA & Hollice Espy, MD  Follow up/ Additional notes: will call with results

## 2021-08-08 ENCOUNTER — Ambulatory Visit (INDEPENDENT_AMBULATORY_CARE_PROVIDER_SITE_OTHER): Payer: Medicaid Other

## 2021-08-08 ENCOUNTER — Encounter: Payer: Self-pay | Admitting: Urology

## 2021-08-08 ENCOUNTER — Ambulatory Visit: Payer: Self-pay

## 2021-08-08 DIAGNOSIS — Z86718 Personal history of other venous thrombosis and embolism: Secondary | ICD-10-CM

## 2021-08-08 DIAGNOSIS — R32 Unspecified urinary incontinence: Secondary | ICD-10-CM | POA: Diagnosis not present

## 2021-08-08 DIAGNOSIS — E119 Type 2 diabetes mellitus without complications: Secondary | ICD-10-CM | POA: Diagnosis not present

## 2021-08-08 LAB — POCT INR
INR: 2 (ref 2.0–3.0)
PT: 24

## 2021-08-08 LAB — URINALYSIS, COMPLETE
Bilirubin, UA: NEGATIVE
Glucose, UA: NEGATIVE
Ketones, UA: NEGATIVE
Leukocytes,UA: NEGATIVE
Nitrite, UA: NEGATIVE
Specific Gravity, UA: 1.02 (ref 1.005–1.030)
Urobilinogen, Ur: 0.2 mg/dL (ref 0.2–1.0)
pH, UA: 6 (ref 5.0–7.5)

## 2021-08-08 LAB — MICROSCOPIC EXAMINATION

## 2021-08-08 NOTE — Telephone Encounter (Signed)
Please advise 

## 2021-08-08 NOTE — Telephone Encounter (Signed)
Message from [place] sent at 08/08/2021  1:57 PM EDT  Pt has been experiencing elevated blood pressure with some dizziness. Pt not current having any symptoms but requested sooner appt with Dr. Jacinto Reap. Pt requests call back.     Call History   Type Contact Phone/Fax User  08/08/2021 01:56 PM EDT Phone (Incoming) Stephens November, [place] (Self)  Leonides Schanz, [place]  Pt. Reports she thinks her BP has been higher for 1 month. Yesterday it was 163/92. Has had some dizziness as well. Has been tired. Requesting to be seen by her PCP as soon as possible. No availability this week. Please advise. Pt.    Answer Assessment - Initial Assessment Questions 1. BLOOD PRESSURE: "What is the blood pressure?" "Did you take at least two measurements 5 minutes apart?"     163/92 2. ONSET: "When did you take your blood pressure?"     1 month 3. HOW: "How did you obtain the blood pressure?" (e.g., visiting nurse, automatic home BP monitor)     Dr.'s office 4. HISTORY: "Do you have a history of high blood pressure?"     Yes 5. MEDICATIONS: "Are you taking any medications for blood pressure?" "Have you missed any doses recently?"     No missed doses 6. OTHER SYMPTOMS: "Do you have any symptoms?" (e.g., headache, chest pain, blurred vision, difficulty breathing, weakness)     Tired, dizziness 7. PREGNANCY: "Is there any chance you are pregnant?" "When was your last menstrual period?"     no  Protocols used: Blood Pressure - High-A-AH

## 2021-08-08 NOTE — Patient Instructions (Signed)
Description   Continue 5mg  daily except 2.5mg  Monday and Wednesday.  Recheck in four weeks.

## 2021-08-09 ENCOUNTER — Ambulatory Visit
Payer: Medicaid Other | Attending: Student in an Organized Health Care Education/Training Program | Admitting: Student in an Organized Health Care Education/Training Program

## 2021-08-09 ENCOUNTER — Other Ambulatory Visit: Payer: Self-pay

## 2021-08-09 DIAGNOSIS — M722 Plantar fascial fibromatosis: Secondary | ICD-10-CM | POA: Diagnosis not present

## 2021-08-09 DIAGNOSIS — M961 Postlaminectomy syndrome, not elsewhere classified: Secondary | ICD-10-CM | POA: Diagnosis not present

## 2021-08-09 DIAGNOSIS — M5136 Other intervertebral disc degeneration, lumbar region: Secondary | ICD-10-CM

## 2021-08-09 DIAGNOSIS — Z0289 Encounter for other administrative examinations: Secondary | ICD-10-CM

## 2021-08-09 DIAGNOSIS — G894 Chronic pain syndrome: Secondary | ICD-10-CM | POA: Diagnosis not present

## 2021-08-09 DIAGNOSIS — M792 Neuralgia and neuritis, unspecified: Secondary | ICD-10-CM

## 2021-08-09 DIAGNOSIS — M1712 Unilateral primary osteoarthritis, left knee: Secondary | ICD-10-CM

## 2021-08-09 DIAGNOSIS — E119 Type 2 diabetes mellitus without complications: Secondary | ICD-10-CM | POA: Diagnosis not present

## 2021-08-09 MED ORDER — AMLODIPINE BESYLATE 5 MG PO TABS: 5.0000 mg | ORAL_TABLET | Freq: Every day | ORAL | 0 refills | Status: DC

## 2021-08-09 MED ORDER — CAPSAICIN 0.025 % EX CREA
TOPICAL_CREAM | Freq: Two times a day (BID) | CUTANEOUS | 0 refills | Status: DC
Start: 2021-08-09 — End: 2022-03-20

## 2021-08-09 NOTE — Addendum Note (Signed)
Addended by: Althea Charon D on: 08/09/2021 10:14 AM   Modules accepted: Orders

## 2021-08-09 NOTE — Progress Notes (Signed)
Patient: Jane [name]  Service Category: E/M  Provider: Gillis Santa, MD  DOB: 16-Feb-1966  DOS: 08/09/2021  Location: Office  MRN: [id number]  Setting: Ambulatory outpatient  Referring Provider: [name], MD  Type: Established Patient  Specialty: Interventional Pain Management  PCP: [name], MD  Location: Remote location  Delivery: TeleHealth     Virtual Encounter - Pain Management PROVIDER NOTE: Information contained herein reflects review and annotations entered in association with encounter. Interpretation of such information and data should be left to medically-trained personnel. Information provided to patient can be located elsewhere in the medical record under "Patient Instructions". Document created using STT-dictation technology, any transcriptional errors that may result from process are unintentional.    Contact & Pharmacy Preferred: [phone] Home: [phone] (home) Mobile: [phone] (mobile) E-mail: [email or web address]  [place], [place] [address] [place] [address] Phone: [phone] Fax: [phone]   Pre-screening  [name] November offered "in-person" vs "virtual" encounter. She indicated preferring virtual for this encounter.   Reason COVID-19*  Social distancing based on CDC and AMA recommendations.   I contacted [name] on 08/09/2021 via telephone.      I clearly identified myself as Gillis Santa, MD. I verified that I was speaking with the correct person using two identifiers (Name: Zettie Gootee, and date of birth: 1966-05-14).  Consent I sought verbal advanced consent from [name] for virtual visit interactions. I informed Ms. Whitehead of possible security and privacy concerns, risks, and limitations associated with providing "not-in-person" medical evaluation and management services. I also informed Ms. Magner of the availability of "in-person" appointments. Finally,  I informed her that there would be a charge for the virtual visit and that she could be  personally, fully or partially, financially responsible for it. Ms. Angevine expressed understanding and agreed to proceed.   Historic Elements   Ms. Caliann Leckrone is a 55 y.o. year old, female patient evaluated today after our last contact on 07/17/2021. Ms. Martire  has a past medical history of ADD (attention deficit disorder), Anxiety, Back pain, Bilateral swelling of feet, Bipolar 1 disorder ([place]), Bipolar 1 disorder ([place]), Bipolar disorder ([place]), Chewing difficulty, Chronic fatigue syndrome, Chronic kidney disease, Constipation, Depression, Diabetes mellitus without complication ([place]), Dyspnea, GERD (gastroesophageal reflux disease), Headache, High cholesterol, History of blood clots, History of DVT (deep vein thrombosis), Hypertension, Hypothyroidism, Joint pain, Neuropathy, Obsessive-compulsive disorder, Peripheral vascular disease ([place]), Prediabetes, Respiratory failure requiring intubation ([place]), Restless leg syndrome, Shortness of breath, Sleep apnea, Thrombocytopenia ([place]) (09/15/2019), Trigger thumb of left hand (01/2018), and Trigger thumb of right hand. She also  has a past surgical history that includes Cholecystectomy; Trigger finger release (Right, 12/01/2017); Abdominal hysterectomy (06/2016); Lumbar fusion (11/21/2000); Lumbar spine surgery; Trigger finger release (Left, 01/26/2018); LOWER EXTREMITY VENOGRAPHY (N/A, 08/17/2018); Femoral-femoral Bypass Graft (Left, 11/24/2018); AV fistula placement (Left, 11/24/2018); LOWER EXTREMITY VENOGRAPHY (Bilateral, 03/09/2019); Patch angioplasty (Left, 03/30/2019); Ultrasound guidance for vascular access (Right, 03/30/2019); Femoral artery debridement (03/30/2019); Insertion of iliac stent (03/30/2019); Groin debridement (Left, 04/27/2019); Application if wound vac (Left, 04/27/2019); LOWER EXTREMITY VENOGRAPHY (Left, 08/16/2019); PERIPHERAL VASCULAR INTERVENTION (Left,  08/16/2019); Back surgery; Colonoscopy with propofol (N/A, 11/22/2019); and Knee arthroscopy with meniscal repair (Left, 11/14/2020). Ms. Dias has a current medication list which includes the following prescription(s): capsaicin, alprazolam, amitriptyline, amlodipine, amphetamine-dextroamphetamine, aspirin ec, atorvastatin, cetirizine, cyclosporine, trulicity, fluoxetine, fluoxetine, garlic, hydrochlorothiazide, hydrocodone-acetaminophen, [START ON 08/22/2021] hydrocodone-acetaminophen, [START ON 09/21/2021] hydrocodone-acetaminophen, levothyroxine, linzess, methocarbamol, multivitamin, olopatadine hcl, omega-3 fatty acids,  pantoprazole, potassium chloride, pregabalin, ropinirole, triamcinolone cream, gemtesa, and warfarin. She  reports that she has never smoked. She has never used smokeless tobacco. She reports that she does not drink alcohol and does not use drugs. Ms. Holtrop is allergic to aripiprazole, seroquel [quetiapine fumarate], chlorpromazine, and gabapentin.   HPI  Today, she is being contacted for medication management.  Unfortunately Medicaid did not approve patients Qutenza treatment for diabetic painful neuropathy of bilateral feet.  Recommend that she try over-the-counter capsaicin cream as below.  Pharmacotherapy Assessment   Analgesic: Hydrocodone 10 mg 4 times daily as needed, quantity 120/month; MME equals 40   Monitoring: [place] PMP: PDMP reviewed during this encounter.       Pharmacotherapy: No side-effects or adverse reactions reported. Compliance: No problems identified. Effectiveness: Clinically acceptable. Plan: Refer to "POC". UDS:  Summary  Date Value Ref Range Status  04/10/2021 Note  Final    Comment:    ==================================================================== [employer] Select 13 (MW) ==================================================================== Test                             Result       Flag       Units  Drug Present and Declared for  Prescription Verification   Amphetamine                    6176         EXPECTED   ng/mg creat    Amphetamine is available as a schedule II prescription drug.    Hydrocodone                    2716         EXPECTED   ng/mg creat   Hydromorphone                  62           EXPECTED   ng/mg creat   Dihydrocodeine                 311          EXPECTED   ng/mg creat   Norhydrocodone                 1278         EXPECTED   ng/mg creat    Sources of hydrocodone include scheduled prescription medications.    Hydromorphone, dihydrocodeine and norhydrocodone are expected    metabolites of hydrocodone. Hydromorphone and dihydrocodeine are    also available as scheduled prescription medications.  Drug Absent but Declared for Prescription Verification   Alprazolam                     Not Detected UNEXPECTED ng/mg creat ==================================================================== Test                      Result    Flag   Units      Ref Range   Creatinine              82               mg/dL      >=20 ==================================================================== Declared Medications:  The flagging and interpretation on this report are based on the  following declared medications.  Unexpected results may arise from  inaccuracies in the declared medications.   **Note: The testing scope of this panel includes these  medications:   Alprazolam (Xanax)  Amphetamine (Adderall)  Hydrocodone (Norco)   **Note: The testing scope of this panel does not include the  following reported medications:   Acetaminophen (Norco)  Aspirin  Atorvastatin (Lipitor)  Cetirizine (Zyrtec)  Eye Drops  Fluoxetine (Prozac)  Hydrochlorothiazide (Hydrodiuril)  Linaclotide (Linzess)  Methocarbamol (Robaxin)  Multivitamin  Omega-3 Fatty Acids  Pantoprazole (Protonix)  Potassium (Klor-Con)  Pregabalin (Lyrica)  Ropinirole (Requip)  Supplement  Warfarin  (Coumadin) ==================================================================== For clinical consultation, please call [phone]. ====================================================================      Laboratory Chemistry Profile   Renal Lab Results  Component Value Date   BUN 33 (H) 04/12/2021   CREATININE 1.23 (H) 04/12/2021   BCR 21 03/22/2021   GFRAA 53 (L) 07/18/2020   GFRNONAA 52 (L) 04/12/2021    Hepatic Lab Results  Component Value Date   AST 17 04/12/2021   ALT 20 04/12/2021   ALBUMIN 3.8 04/12/2021   ALKPHOS 95 04/12/2021    Electrolytes Lab Results  Component Value Date   NA 140 04/12/2021   K 3.8 04/12/2021   CL 101 04/12/2021   CALCIUM 9.5 04/12/2021   MG 2.2 04/04/2019   PHOS 4.0 04/04/2019    Bone Lab Results  Component Value Date   VD25OH 50.60 01/01/2021    Inflammation (CRP: Acute Phase) (ESR: Chronic Phase) Lab Results  Component Value Date   CRP 11 (H) 02/16/2020   ESRSEDRATE 33 02/16/2020   LATICACIDVEN 1.2 01/05/2020         Note: Above Lab results reviewed.  Imaging  DG Knee AP/LAT W/[place] Right Right knee pain acute times of months  Getting better  Normal alignment  No significant degenerative changes  Impression normal right knee  Assessment  The primary encounter diagnosis was Chronic pain syndrome. Diagnoses of Failed back surgical syndrome, Neurogenic pain, DDD (degenerative disc disease), lumbar, Pain management contract signed, and Arthritis of left knee were also pertinent to this visit.  Plan of Care  Problem-specific:  No problem-specific Assessment & Plan notes found for this encounter.  Ms. Derra Shartzer has a current medication list which includes the following long-term medication(s): amitriptyline, amlodipine, amphetamine-dextroamphetamine, atorvastatin, cetirizine, fluoxetine, fluoxetine, hydrochlorothiazide, levothyroxine, linzess, pantoprazole, potassium chloride, ropinirole, and  warfarin.  Pharmacotherapy (Medications Ordered): Meds ordered this encounter  Medications   capsaicin (ZOSTRIX) 0.025 % cream    Sig: Apply topically 2 (two) times daily.    Dispense:  60 g    Refill:  0   Orders:  No orders of the defined types were placed in this encounter.  Follow-up plan:   Return for Keep sch. appt.    Recent Visits Date Type Provider Dept  07/17/21 Office Visit Gillis Santa, MD [facility]  Showing recent visits within past 90 days and meeting all other requirements Today's Visits Date Type Provider Dept  08/09/21 Office Visit Gillis Santa, MD [facility]  Showing today's visits and meeting all other requirements Future Appointments Date Type Provider Dept  10/09/21 Appointment Gillis Santa, MD [facility]  Showing future appointments within next 90 days and meeting all other requirements I discussed the assessment and treatment plan with the patient. The patient was provided an opportunity to ask questions and all were answered. The patient agreed with the plan and demonstrated an understanding of the instructions.  Patient advised to call back or seek an in-person evaluation if the symptoms or condition worsens.  Duration of encounter: 44minutes.  Note by: Gillis Santa, MD Date: 08/09/2021; Time:  4:47 PM

## 2021-08-10 DIAGNOSIS — E119 Type 2 diabetes mellitus without complications: Secondary | ICD-10-CM | POA: Diagnosis not present

## 2021-08-11 DIAGNOSIS — E119 Type 2 diabetes mellitus without complications: Secondary | ICD-10-CM | POA: Diagnosis not present

## 2021-08-12 DIAGNOSIS — E119 Type 2 diabetes mellitus without complications: Secondary | ICD-10-CM | POA: Diagnosis not present

## 2021-08-13 ENCOUNTER — Other Ambulatory Visit: Payer: Self-pay

## 2021-08-13 ENCOUNTER — Ambulatory Visit: Payer: Medicaid Other

## 2021-08-13 ENCOUNTER — Encounter: Payer: Self-pay | Admitting: Orthopedic Surgery

## 2021-08-13 ENCOUNTER — Ambulatory Visit (INDEPENDENT_AMBULATORY_CARE_PROVIDER_SITE_OTHER): Payer: Medicaid Other | Admitting: Orthopedic Surgery

## 2021-08-13 ENCOUNTER — Encounter (HOSPITAL_COMMUNITY): Payer: Self-pay | Admitting: Hematology

## 2021-08-13 VITALS — BP 156/85 | HR 79 | Ht 70.0 in | Wt 274.8 lb

## 2021-08-13 DIAGNOSIS — M25561 Pain in right knee: Secondary | ICD-10-CM | POA: Diagnosis not present

## 2021-08-13 DIAGNOSIS — M25562 Pain in left knee: Secondary | ICD-10-CM | POA: Diagnosis not present

## 2021-08-13 DIAGNOSIS — E119 Type 2 diabetes mellitus without complications: Secondary | ICD-10-CM | POA: Diagnosis not present

## 2021-08-13 DIAGNOSIS — G8929 Other chronic pain: Secondary | ICD-10-CM | POA: Diagnosis not present

## 2021-08-13 NOTE — Patient Instructions (Signed)

## 2021-08-13 NOTE — Progress Notes (Signed)
Chief Complaint  Patient presents with   Knee Pain    bilateral   Joint is 55 years old she has had a history of DVTs refractory to several anticoagulants but responded well with warfarin  She complains of bilateral knee pain.  She says her whole knee hurts its difficult for her to walk after getting up to a standing position  She is on Norco 10 mg for pain which includes chronic back pain and she has been on that for years  She did have arthroscopy of the left knee with good result initially but now that knee is also hurting  Examination reveals swelling of the left knee none on the right tenderness of the medial joint lines of both knees fortunately she is maintained 120 degrees of knee flexion both knees are stable  X-ray of the right knee was done on July 2022 she has fairly good knee preservation on that knee today we did the left knee and she has grade 2 arthritis with narrowing of the medial compartment but no secondary bone changes  She is a very difficult patient and that she is 55 years old she cannot take anti-inflammatories she is exhausted some of the opioids and is on warfarin  Recommend 2 injections  Inject right knee and left knee Medication 6 mg [place] Lidocaine 1% 4 cc Permission given to inject both knees Alcohol ethyl chloride for injection preparation of the skin Right knee injected in the left knee injected  Follow-up as needed

## 2021-08-14 DIAGNOSIS — E119 Type 2 diabetes mellitus without complications: Secondary | ICD-10-CM | POA: Diagnosis not present

## 2021-08-15 ENCOUNTER — Other Ambulatory Visit: Payer: Self-pay | Admitting: *Deleted

## 2021-08-15 DIAGNOSIS — N3001 Acute cystitis with hematuria: Secondary | ICD-10-CM

## 2021-08-15 DIAGNOSIS — E119 Type 2 diabetes mellitus without complications: Secondary | ICD-10-CM | POA: Diagnosis not present

## 2021-08-15 MED ORDER — GEMTESA 75 MG PO TABS
75.0000 mg | ORAL_TABLET | Freq: Every day | ORAL | 11 refills | Status: DC
Start: 2021-08-15 — End: 2022-07-17

## 2021-08-16 ENCOUNTER — Other Ambulatory Visit: Payer: Self-pay

## 2021-08-16 ENCOUNTER — Inpatient Hospital Stay (HOSPITAL_COMMUNITY): Payer: Medicaid Other | Attending: Hematology

## 2021-08-16 DIAGNOSIS — D509 Iron deficiency anemia, unspecified: Secondary | ICD-10-CM | POA: Insufficient documentation

## 2021-08-16 DIAGNOSIS — E119 Type 2 diabetes mellitus without complications: Secondary | ICD-10-CM | POA: Diagnosis not present

## 2021-08-16 DIAGNOSIS — Q969 Turner's syndrome, unspecified: Secondary | ICD-10-CM | POA: Diagnosis not present

## 2021-08-16 DIAGNOSIS — Z7901 Long term (current) use of anticoagulants: Secondary | ICD-10-CM | POA: Diagnosis not present

## 2021-08-16 DIAGNOSIS — Z862 Personal history of diseases of the blood and blood-forming organs and certain disorders involving the immune mechanism: Secondary | ICD-10-CM | POA: Diagnosis not present

## 2021-08-16 DIAGNOSIS — Z86718 Personal history of other venous thrombosis and embolism: Secondary | ICD-10-CM | POA: Diagnosis not present

## 2021-08-16 LAB — CBC WITH DIFFERENTIAL/PLATELET
Abs Immature Granulocytes: 0.05 10*3/uL (ref 0.00–0.07)
Basophils Absolute: 0.1 10*3/uL (ref 0.0–0.1)
Basophils Relative: 1 %
Eosinophils Absolute: 0 10*3/uL (ref 0.0–0.5)
Eosinophils Relative: 0 %
HCT: 45.3 % (ref 36.0–46.0)
Hemoglobin: 15.2 g/dL — ABNORMAL HIGH (ref 12.0–15.0)
Immature Granulocytes: 1 %
Lymphocytes Relative: 27 %
Lymphs Abs: 2.5 10*3/uL (ref 0.7–4.0)
MCH: 31.5 pg (ref 26.0–34.0)
MCHC: 33.6 g/dL (ref 30.0–36.0)
MCV: 93.8 fL (ref 80.0–100.0)
Monocytes Absolute: 0.9 10*3/uL (ref 0.1–1.0)
Monocytes Relative: 10 %
Neutro Abs: 5.8 10*3/uL (ref 1.7–7.7)
Neutrophils Relative %: 61 %
Platelets: 156 10*3/uL (ref 150–400)
RBC: 4.83 MIL/uL (ref 3.87–5.11)
RDW: 13.7 % (ref 11.5–15.5)
WBC: 9.3 10*3/uL (ref 4.0–10.5)
nRBC: 0 % (ref 0.0–0.2)

## 2021-08-16 LAB — IRON AND TIBC
Iron: 93 ug/dL (ref 28–170)
Saturation Ratios: 27 % (ref 10.4–31.8)
TIBC: 348 ug/dL (ref 250–450)
UIBC: 255 ug/dL

## 2021-08-16 LAB — FERRITIN: Ferritin: 137 ng/mL (ref 11–307)

## 2021-08-17 DIAGNOSIS — E119 Type 2 diabetes mellitus without complications: Secondary | ICD-10-CM | POA: Diagnosis not present

## 2021-08-18 DIAGNOSIS — E119 Type 2 diabetes mellitus without complications: Secondary | ICD-10-CM | POA: Diagnosis not present

## 2021-08-19 DIAGNOSIS — E119 Type 2 diabetes mellitus without complications: Secondary | ICD-10-CM | POA: Diagnosis not present

## 2021-08-20 ENCOUNTER — Encounter (HOSPITAL_COMMUNITY): Payer: Self-pay | Admitting: Hematology

## 2021-08-20 DIAGNOSIS — E119 Type 2 diabetes mellitus without complications: Secondary | ICD-10-CM | POA: Diagnosis not present

## 2021-08-21 DIAGNOSIS — E119 Type 2 diabetes mellitus without complications: Secondary | ICD-10-CM | POA: Diagnosis not present

## 2021-08-22 ENCOUNTER — Encounter (HOSPITAL_COMMUNITY): Payer: Self-pay | Admitting: Hematology

## 2021-08-22 DIAGNOSIS — E119 Type 2 diabetes mellitus without complications: Secondary | ICD-10-CM | POA: Diagnosis not present

## 2021-08-22 NOTE — Progress Notes (Signed)
[facility], [place] [address]   CLINIC:  Medical Oncology/Hematology  PCP:  [name], MD [address] [place] [address] [phone]   REASON FOR VISIT:  Follow-up for iron deficiency anemia   CURRENT THERAPY: Intermittent iron infusions (most recent Feraheme on 05/11/2021)  INTERVAL HISTORY:  Ms. Demartini 55 y.o. female returns for routine follow-up of her iron deficiency anemia.  She was last seen by Tarri Abernethy PA-C on 04/18/2021.  At today's visit, she reports feeling fair apart from fatigue.  No recent hospitalizations, surgeries, or changes in baseline health status.  She continues to feel very fatigued, with no improvement after IV iron.  She also notes restless legs (chronic), lightheadedness, and dyspnea on exertion (seeing PCP for further workup). She has occasional "chest heaviness," but denies any pain in her chest.  No pica or syncopal episodes.   She notes that she has had mild hematuria for many years, and is seeing a urologist for work-up of this.  She denies any major bleeding episodes such as hematemesis, hematochezia, or melena.    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  She continues to follow with vascular surgeons.  She does have chronic left-sided leg swelling due to post thrombotic changes from multiple left lower extremity DVTs, but denies any changes from her baseline swelling.  She has little to no energy and 75% appetite. She endorses that she is maintaining a stable weight.   REVIEW OF SYSTEMS:  Review of Systems  Constitutional:  Positive for fatigue. Negative for appetite change, chills, diaphoresis, fever and unexpected weight change.  HENT:   Negative for lump/mass and nosebleeds.   Eyes:  Negative for eye problems.  Respiratory:  Positive for shortness of breath (with exertion). Negative for cough and hemoptysis.    Cardiovascular:  Negative for chest pain, leg swelling and palpitations.       Complains of intermittent chest "heaviness," none today  Gastrointestinal:  Positive for constipation. Negative for abdominal pain, blood in stool, diarrhea, nausea and vomiting.  Genitourinary:  Positive for hematuria (following with urology).   Skin: Negative.   Neurological:  Positive for headaches and numbness (feet). Negative for dizziness and light-headedness.  Hematological:  Does not bruise/bleed easily.  Psychiatric/Behavioral:  Positive for sleep disturbance.      PAST MEDICAL/SURGICAL HISTORY:  Past Medical History:  Diagnosis Date   ADD (attention deficit disorder)    Anxiety    Back pain    Bilateral swelling of feet    Bipolar 1 disorder (HCC)    Bipolar 1 disorder (HCC)    Bipolar disorder (HCC)    Chewing difficulty    Chronic fatigue syndrome    Chronic kidney disease    Stage 3 kidney disease;dx by Dr. Sinda Du.    Constipation    Depression    Diabetes mellitus without complication (HCC)    diet controlled   Dyspnea    with exertion   GERD (gastroesophageal reflux disease)    Headache    migraines   High cholesterol    History of blood clots    History of DVT (deep vein thrombosis)    left leg   Hypertension    states under control with meds., has been on med. x 2 years   Hypothyroidism    Joint pain    Neuropathy    Obsessive-compulsive disorder    Peripheral vascular  disease ([facility] [place] [facility])    Prediabetes    Respiratory failure requiring intubation ([place])    Restless leg syndrome    Shortness of breath    Sleep apnea    Thrombocytopenia ([place]) 09/15/2019   Trigger thumb of left hand 01/2018   Trigger thumb of right hand    Past Surgical History:  Procedure Laterality Date   ABDOMINAL HYSTERECTOMY  06/2016   complete   APPLICATION OF WOUND VAC Left 04/27/2019   Procedure: APPLICATION OF WOUND VAC LEFT GROIN;  Surgeon: Waynetta Sandy, MD;  Location: [facility];   Service: Vascular;  Laterality: Left;   AV FISTULA PLACEMENT Left 11/24/2018   Procedure: ARTERIOVENOUS (AV) FISTULA CREATION LEFT SFA TO LEFT FEMORAL VEIN;  Surgeon: Waynetta Sandy, MD;  Location: [facility];  Service: Vascular;  Laterality: Left;   BACK SURGERY     CHOLECYSTECTOMY     COLONOSCOPY WITH PROPOFOL N/A 11/22/2019   Procedure: COLONOSCOPY WITH PROPOFOL;  Surgeon: [name], MD;  Location: [facility];  Service: Gastroenterology;  Laterality: N/A;   FEMORAL ARTERY EXPLORATION  03/30/2019   Procedure: Left Common Femoral Artery and Vein Exploration;  Surgeon: Waynetta Sandy, MD;  Location: [facility];  Service: Vascular;;   FEMORAL-FEMORAL BYPASS GRAFT Left 11/24/2018   Procedure: BYPASS GRAFT FEMORAL-FEMORAL VENOUS LEFT TO RIGHT PALMA PROCEDURE USING CRYOVEIN;  Surgeon: Waynetta Sandy, MD;  Location: [facility];  Service: Vascular;  Laterality: Left;   GROIN DEBRIDEMENT Left 04/27/2019   Procedure: GROIN DEBRIDEMENT;  Surgeon: Waynetta Sandy, MD;  Location: [facility];  Service: Vascular;  Laterality: Left;   INSERTION OF ILIAC STENT  03/30/2019   Procedure: Stent of left common, external iliac veins and left common femoral vein;  Surgeon: Waynetta Sandy, MD;  Location: [facility];  Service: Vascular;;   KNEE ARTHROSCOPY WITH MENISCAL REPAIR Left 11/14/2020   Procedure: LEFT KNEE ARTHROSCOPY WITH PARTIAL MEDIAL MENISCECTOMY;  Surgeon: Carole Civil, MD;  Location: [facility];  Service: Orthopedics;  Laterality: Left;   LOWER EXTREMITY VENOGRAPHY N/A 08/17/2018   Procedure: LOWER EXTREMITY VENOGRAPHY - Central Venogram;  Surgeon: Waynetta Sandy, MD;  Location: [facility];  Service: Cardiovascular;  Laterality: N/A;   LOWER EXTREMITY VENOGRAPHY Bilateral 03/09/2019   Procedure: LOWER EXTREMITY VENOGRAPHY;  Surgeon: Waynetta Sandy, MD;  Location: [facility];  Service: Cardiovascular;  Laterality: Bilateral;   LOWER  EXTREMITY VENOGRAPHY Left 08/16/2019   Procedure: LOWER EXTREMITY VENOGRAPHY;  Surgeon: Waynetta Sandy, MD;  Location: [facility];  Service: Cardiovascular;  Laterality: Left;   LUMBAR FUSION  11/21/2000   L5-S1   LUMBAR SPINE SURGERY     x 2 others   PATCH ANGIOPLASTY Left 03/30/2019   Procedure: Patch Angioplasty of the Left Common Femoral Vein using Venosure Biologic patch;  Surgeon: Waynetta Sandy, MD;  Location: [facility];  Service: Vascular;  Laterality: Left;   PERIPHERAL VASCULAR INTERVENTION Left 08/16/2019   Procedure: PERIPHERAL VASCULAR INTERVENTION;  Surgeon: Waynetta Sandy, MD;  Location: [facility];  Service: Cardiovascular;  Laterality: Left;  common femoral/femoral vein stent   TRIGGER FINGER RELEASE Right 12/01/2017   Procedure: RELEASE TRIGGER FINGER/A-1 PULLEY RIGHT THUMB;  Surgeon: Leanora Cover, MD;  Location: [facility];  Service: Orthopedics;  Laterality: Right;   TRIGGER FINGER RELEASE Left 01/26/2018   Procedure: LEFT TRIGGER THUMB RELEASE;  Surgeon: Leanora Cover, MD;  Location: [facility];  Service: Orthopedics;  Laterality: Left;  ULTRASOUND GUIDANCE FOR VASCULAR ACCESS Right 03/30/2019   Procedure: Ultrasound-guided cannulation right internal jugular vein;  Surgeon: Waynetta Sandy, MD;  Location: [facility];  Service: Vascular;  Laterality: Right;     SOCIAL HISTORY:  Social History   Socioeconomic History   Marital status: Divorced    Spouse name: Not on file   Number of children: 3   Years of education: Not on file   Highest education level: Not on file  Occupational History   Not on file  Tobacco Use   Smoking status: Never   Smokeless tobacco: Never  Vaping Use   Vaping Use: Never used  Substance and Sexual Activity   Alcohol use: No   Drug use: No   Sexual activity: Not Currently    Partners: Male    Birth control/protection: Surgical  Other Topics Concern   Not on  file  Social History Narrative   Not on file   Social Determinants of Health   Financial Resource Strain: High Risk   Difficulty of Paying Living Expenses: Hard  Food Insecurity: Food Insecurity Present   Worried About Running Out of Food in the Last Year: Sometimes true   Ran Out of Food in the Last Year: Never true  Transportation Needs: No Transportation Needs   Lack of Transportation (Medical): No   Lack of Transportation (Non-Medical): No  Physical Activity: Inactive   Days of Exercise per Week: 0 days   Minutes of Exercise per Session: 0 min  Stress: Stress Concern Present   Feeling of Stress : Very much  Social Connections: Socially Isolated   Frequency of Communication with Friends and Family: More than three times a week   Frequency of Social Gatherings with Friends and Family: Three times a week   Attends Religious Services: Never   Active Member of Clubs or Organizations: No   Attends Music therapist: Never   Marital Status: Divorced  Human resources officer Violence: Not At Risk   Fear of Current or Ex-Partner: No   Emotionally Abused: No   Physically Abused: No   Sexually Abused: No    FAMILY HISTORY:  Family History  Problem Relation Age of Onset   Heart disease Mother    Hyperlipidemia Mother    Hypertension Mother    Bipolar disorder Mother    Stroke Mother    Depression Mother    Sleep apnea Mother    Obesity Mother    Diabetes Father    Heart disease Father    Hyperlipidemia Father    Hypertension Father    Sleep apnea Father    Obesity Father    Drug abuse Daughter    ADD / ADHD Daughter    Drug abuse Daughter    Anxiety disorder Daughter    Bipolar disorder Daughter    Hypertension Sister    Hypertension Brother    Hyperlipidemia Brother    Heart disease Brother    Bipolar disorder Maternal Aunt    Suicidality Maternal Aunt     CURRENT MEDICATIONS:  Outpatient Encounter Medications as of 08/23/2021  Medication Sig Note    ALPRAZolam (XANAX) 1 MG tablet Take 1 tablet (1 mg total) by mouth 2 (two) times daily as needed for anxiety.    amitriptyline (ELAVIL) 25 MG tablet Take 25 mg by mouth at bedtime.    amLODipine (NORVASC) 5 MG tablet Take 1 tablet (5 mg total) by mouth daily.    amphetamine-dextroamphetamine (ADDERALL) 30 MG tablet Take 1 tablet by mouth  2 (two) times daily.    aspirin EC 81 MG tablet Take 81 mg by mouth daily.    atorvastatin (LIPITOR) 20 MG tablet Take 1 tablet (20 mg total) by mouth daily. Please schedule office visit before any future refills.    capsaicin (ZOSTRIX) 0.025 % cream Apply topically 2 (two) times daily.    cetirizine (ZYRTEC) 10 MG tablet TAKE 1 TABLET BY MOUTH DAILY    cycloSPORINE (RESTASIS) 0.05 % ophthalmic emulsion Place 1 drop into both eyes 2 (two) times daily.    Dulaglutide (TRULICITY) 0.93 OI/7.1IW SOPN Inject 0.75 mg into the skin once a week. 07/17/2021: Has not started   FLUoxetine (PROZAC) 20 MG capsule Take 1 capsule (20 mg total) by mouth daily.    FLUoxetine (PROZAC) 40 MG capsule TAKE 1 TABLET BY MOUTH DAILY    GARLIC PO Take 5,809 mg by mouth daily.    hydrochlorothiazide (HYDRODIURIL) 25 MG tablet Take 1 tablet (25 mg total) by mouth daily. Please schedule office visit before any future refills.    HYDROcodone-acetaminophen (NORCO) 10-325 MG tablet Take 1 tablet by mouth every 6 (six) hours as needed. To last 30 days from fill date    HYDROcodone-acetaminophen (NORCO) 10-325 MG tablet Take 1 tablet by mouth every 6 (six) hours as needed. To last 30 days from fill date    [START ON 09/21/2021] HYDROcodone-acetaminophen (NORCO) 10-325 MG tablet Take 1 tablet by mouth every 6 (six) hours as needed. To last 30 days from fill date    levothyroxine (SYNTHROID) 125 MCG tablet Take 125 mcg by mouth daily before breakfast.    LINZESS 145 MCG CAPS capsule TAKE ONE CAPSULE BY MOUTH DAILY (Patient taking differently: Take 145 mcg by mouth daily as needed.)    methocarbamol  (ROBAXIN) 750 MG tablet Take 1 tablet (750 mg total) by mouth 2 (two) times daily as needed for muscle spasms.    Multiple Vitamin (MULTIVITAMIN) capsule Take 1 capsule by mouth daily.    Olopatadine HCl 0.2 % SOLN Place 1 drop into both eyes daily.    Omega-3 Fatty Acids (OMEGA-3 FISH OIL PO) Take 600 mg by mouth daily.    pantoprazole (PROTONIX) 40 MG tablet TAKE 1 TABLET BY MOUTH DAILY (Patient taking differently: Take 40 mg by mouth daily.)    potassium chloride (KLOR-CON) 10 MEQ tablet TAKE 1 TABLET BY MOUTH THREE TIMES DAILY    pregabalin (LYRICA) 100 MG capsule Take 100 mg by mouth 4 (four) times daily.    rOPINIRole (REQUIP) 2 MG tablet TAKE 1 TABLET BY MOUTH TWICE DAILY (Patient taking differently: Take 2 mg by mouth 2 (two) times daily.)    triamcinolone cream (KENALOG) 0.1 % Apply topically 2 (two) times daily.    Vibegron (GEMTESA) 75 MG TABS Take 75 mg by mouth daily.    warfarin (COUMADIN) 5 MG tablet TAKE 1 AND 1/2 TABLETS BY MOUTH ON TUEDAYS AND THURSDAYS, THEN TAKE 1 TABLET DAILY ON ALL OTHER DAYS (Patient taking differently: 1/2 tablet on Monday and Wednesday 1 tablet all other days)    No facility-administered encounter medications on file as of 08/23/2021.    ALLERGIES:  Allergies  Allergen Reactions   Aripiprazole Other (See Comments)    BECOMES  VIOLENT    Seroquel [Quetiapine Fumarate] Other (See Comments)    BECOMES VIOLENT   Chlorpromazine Other (See Comments)    SEVERE ANXIETY    Gabapentin Other (See Comments)    NIGHTMARES      PHYSICAL EXAM:  ECOG PERFORMANCE STATUS: 2 - Symptomatic, <50% confined to bed  There were no vitals filed for this visit. There were no vitals filed for this visit. Physical Exam Constitutional:      Appearance: Normal appearance. She is obese.  HENT:     Head: Normocephalic and atraumatic.     Mouth/Throat:     Mouth: Mucous membranes are moist.  Eyes:     Extraocular Movements: Extraocular movements intact.      Pupils: Pupils are equal, round, and reactive to light.  Cardiovascular:     Rate and Rhythm: Normal rate and regular rhythm.     Pulses: Normal pulses.     Heart sounds: Normal heart sounds.  Pulmonary:     Effort: Pulmonary effort is normal.     Breath sounds: Normal breath sounds.  Abdominal:     General: Bowel sounds are normal.     Palpations: Abdomen is soft.     Tenderness: There is no abdominal tenderness.  Musculoskeletal:        General: No swelling.     Right lower leg: No edema.     Left lower leg: Edema (1+ pitting edema with hyperpigmentation of skin) present.  Lymphadenopathy:     Cervical: No cervical adenopathy.  Skin:    General: Skin is warm and dry.  Neurological:     General: No focal deficit present.     Mental Status: She is alert and oriented to person, place, and time.  Psychiatric:        Mood and Affect: Mood normal.        Behavior: Behavior normal.     LABORATORY DATA:  I have reviewed the labs as listed.  CBC    Component Value Date/Time   WBC 9.3 08/16/2021 1403   RBC 4.83 08/16/2021 1403   HGB 15.2 (H) 08/16/2021 1403   HGB 13.8 03/22/2021 1023   HCT 45.3 08/16/2021 1403   HCT 41.3 03/22/2021 1023   PLT 156 08/16/2021 1403   PLT 136 (L) 03/22/2021 1023   MCV 93.8 08/16/2021 1403   MCV 90 03/22/2021 1023   MCH 31.5 08/16/2021 1403   MCHC 33.6 08/16/2021 1403   RDW 13.7 08/16/2021 1403   RDW 13.1 03/22/2021 1023   LYMPHSABS 2.5 08/16/2021 1403   LYMPHSABS 1.4 03/22/2021 1023   MONOABS 0.9 08/16/2021 1403   EOSABS 0.0 08/16/2021 1403   EOSABS 0.1 03/22/2021 1023   BASOSABS 0.1 08/16/2021 1403   BASOSABS 0.0 03/22/2021 1023   CMP Latest Ref Rng & Units 04/12/2021 04/12/2021 03/22/2021  Glucose 70 - 99 mg/dL 106(H) 103(H) 106(H)  BUN 6 - 20 mg/dL 33(H) 34(H) 29(H)  Creatinine 0.44 - 1.00 mg/dL 1.23(H) 1.26(H) 1.36(H)  Sodium 135 - 145 mmol/L 140 141 142  Potassium 3.5 - 5.1 mmol/L 3.8 3.9 3.8  Chloride 98 - 111 mmol/L 101 102 104   CO2 22 - 32 mmol/L 32 32 22  Calcium 8.9 - 10.3 mg/dL 9.5 9.5 9.2  Total Protein 6.5 - 8.1 g/dL - 7.4 7.2  Total Bilirubin 0.3 - 1.2 mg/dL - 0.6 0.4  Alkaline Phos 38 - 126 U/L - 95 115  AST 15 - 41 U/L - 17 17  ALT 0 - 44 U/L - 20 19    DIAGNOSTIC IMAGING:  I have independently reviewed the relevant imaging and discussed with the patient.  ASSESSMENT & PLAN: 1.  Normocytic anemia: - Colonoscopy on 11/22/2019 shows 3 small polyps in the ascending colon and the  cecum.  Nonbleeding internal hemorrhoids.  Pathology consistent with tubular adenoma. - Failed oral iron supplementation: She has taken iron pills in the past without improvement. - She had a history of blood transfusions in the past when she had her hysterectomy, and also required transfusion after large intraoperative blood loss (3.6 L) during vascular surgery in May 2020 - Combination anemia from CKD and malabsorption-related iron deficiency. - Denies any current signs or symptoms of blood loss, no melena or bright red blood per rectum.  Scant hematuria has been worked up by urology. - She remains significantly fatigued, although her blood and iron counts have normalized - Last IV iron infusion (Feraheme x1) on 05/11/2021 - Most recent labs (08/16/2021): Hgb 15.2, ferritin 137, iron saturation 27% - PLAN: No indication for IV iron at this time.  Repeat labs and RTC in 6 months.   2.  Recurrent left leg DVTs: - She had first episode of left leg DVT in 2014.  She had a recurrent episode was while she was taking Xarelto.  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 - She follows with Dr. Donzetta Matters at [facility] in [place] - She is currently on Coumadin, also follows with the Coumadin clinic for INR checks and dose adjustments. - Most recent DVT diagnosed during ED visit on 04/12/2021: Venous ultrasound shows obstruction at the level of the left common  femoral vein and saphenofemoral junction scarring; appearance of thrombus in the common femoral vein suggests that there is chronic thrombus in the area although superimposed acute DVT in the left cannot be excluded - PLAN: Continue warfarin and follow-up with vascular specialists   PLAN SUMMARY & DISPOSITION: -Repeat labs and RTC in 6 months  All questions were answered. The patient knows to call the clinic with any problems, questions or concerns.  Medical decision making: Low  Time spent on visit: I spent 20 minutes counseling the patient face to face. The total time spent in the appointment was 30 minutes and more than 50% was on counseling.   Harriett Rush, PA-C  08/23/2021 12:30 PM

## 2021-08-23 ENCOUNTER — Inpatient Hospital Stay (HOSPITAL_BASED_OUTPATIENT_CLINIC_OR_DEPARTMENT_OTHER): Payer: Medicaid Other | Admitting: Physician Assistant

## 2021-08-23 ENCOUNTER — Ambulatory Visit (HOSPITAL_COMMUNITY): Payer: Medicaid Other | Admitting: Physician Assistant

## 2021-08-23 ENCOUNTER — Other Ambulatory Visit: Payer: Self-pay

## 2021-08-23 ENCOUNTER — Telehealth: Payer: Self-pay

## 2021-08-23 ENCOUNTER — Encounter (HOSPITAL_COMMUNITY): Payer: Self-pay | Admitting: Hematology

## 2021-08-23 VITALS — HR 68 | Temp 98.7°F | Resp 16 | Wt 275.2 lb

## 2021-08-23 DIAGNOSIS — D509 Iron deficiency anemia, unspecified: Secondary | ICD-10-CM

## 2021-08-23 DIAGNOSIS — I82409 Acute embolism and thrombosis of unspecified deep veins of unspecified lower extremity: Secondary | ICD-10-CM | POA: Diagnosis not present

## 2021-08-23 DIAGNOSIS — Q969 Turner's syndrome, unspecified: Secondary | ICD-10-CM | POA: Diagnosis not present

## 2021-08-23 DIAGNOSIS — Z86718 Personal history of other venous thrombosis and embolism: Secondary | ICD-10-CM | POA: Diagnosis not present

## 2021-08-23 DIAGNOSIS — R5383 Other fatigue: Secondary | ICD-10-CM

## 2021-08-23 DIAGNOSIS — Z862 Personal history of diseases of the blood and blood-forming organs and certain disorders involving the immune mechanism: Secondary | ICD-10-CM | POA: Diagnosis not present

## 2021-08-23 DIAGNOSIS — Z7901 Long term (current) use of anticoagulants: Secondary | ICD-10-CM | POA: Diagnosis not present

## 2021-08-23 DIAGNOSIS — E119 Type 2 diabetes mellitus without complications: Secondary | ICD-10-CM | POA: Diagnosis not present

## 2021-08-23 NOTE — Telephone Encounter (Signed)
Was sent 08/22/2021 by DW

## 2021-08-23 NOTE — Telephone Encounter (Signed)
Need PA Sent to Pharmacy

## 2021-08-23 NOTE — Telephone Encounter (Signed)
Dena sent PA on yesterday.

## 2021-08-23 NOTE — Patient Instructions (Signed)
[place] at [facility] Discharge Instructions  You were seen today by Tarri Abernethy PA-C for your iron deficiency anemia.  At this time, your blood and iron levels look good.  There is no need for any IV iron at this time.  LABS: Return in 6 months for repeat labs  FOLLOW-UP APPOINTMENT: Office visit after labs   Thank you for choosing [place] at [facility] to provide your oncology and hematology care.  To afford each patient quality time with our provider, please arrive at least 15 minutes before your scheduled appointment time.   If you have a lab appointment with the [facility] please come in thru the Main Entrance and check in at the main information desk.  You need to re-schedule your appointment should you arrive 10 or more minutes late.  We strive to give you quality time with our providers, and arriving late affects you and other patients whose appointments are after yours.  Also, if you no show three or more times for appointments you may be dismissed from the clinic at the providers discretion.     Again, thank you for choosing [facility].  Our hope is that these requests will decrease the amount of time that you wait before being seen by our physicians.       _____________________________________________________________  Should you have questions after your visit to [facility], please contact our office at [phone] and follow the prompts.  Our office hours are 8:00 a.m. and 4:30 p.m. Monday - Friday.  Please note that voicemails left after 4:00 p.m. may not be returned until the following business day.  We are closed weekends and major holidays.  You do have access to a nurse 24-7, just call the main number to the clinic [phone] and do not press any options, hold on the line and a nurse will answer the phone.    For prescription refill requests, have your pharmacy contact our office and  allow 72 hours.    Due to Covid, you will need to wear a mask upon entering the hospital. If you do not have a mask, a mask will be given to you at the Main Entrance upon arrival. For doctor visits, patients may have 1 support person age 27 or older with them. For treatment visits, patients can not have anyone with them due to social distancing guidelines and our immunocompromised population.

## 2021-08-24 DIAGNOSIS — E119 Type 2 diabetes mellitus without complications: Secondary | ICD-10-CM | POA: Diagnosis not present

## 2021-08-25 DIAGNOSIS — E119 Type 2 diabetes mellitus without complications: Secondary | ICD-10-CM | POA: Diagnosis not present

## 2021-08-26 DIAGNOSIS — E119 Type 2 diabetes mellitus without complications: Secondary | ICD-10-CM | POA: Diagnosis not present

## 2021-08-27 DIAGNOSIS — E119 Type 2 diabetes mellitus without complications: Secondary | ICD-10-CM | POA: Diagnosis not present

## 2021-08-27 NOTE — Progress Notes (Incomplete)
   08/27/21  CC: No chief complaint on file.    HPI: Jane [name] is a 55 y.o.female with a personal history of CKD III, scute cystitis and OAB, who presents today for cystoscopy.   She is currently on Gemtesa for her OAB.   Her most recent urinalysis on 08/07/2021 was negative for microscopic hematura.       There were no vitals filed for this visit. NED. A&Ox3.   No respiratory distress   Abd soft, NT, ND Normal external genitalia with patent urethral meatus  Cystoscopy Procedure Note  Patient identification was confirmed, informed consent was obtained, and patient was prepped using Betadine solution.  Lidocaine jelly was administered per urethral meatus.    Procedure: - Flexible cystoscope introduced, without any difficulty.   - Thorough search of the bladder revealed:    normal urethral meatus    normal urothelium    no stones    no ulcers     no tumors    no urethral polyps    no trabeculation  - Ureteral orifices were normal in position and appearance.  Post-Procedure: - Patient tolerated the procedure well  Assessment/ Plan:    No follow-ups on file.  I,Kailey Littlejohn,acting as a Education administrator for Hollice Espy, MD.,have documented all relevant documentation on the behalf of Hollice Espy, MD,as directed by  Hollice Espy, MD while in the presence of Hollice Espy, MD.

## 2021-08-28 ENCOUNTER — Other Ambulatory Visit: Payer: Medicaid Other | Admitting: Urology

## 2021-08-28 DIAGNOSIS — E119 Type 2 diabetes mellitus without complications: Secondary | ICD-10-CM | POA: Diagnosis not present

## 2021-08-29 ENCOUNTER — Other Ambulatory Visit: Payer: Self-pay

## 2021-08-29 ENCOUNTER — Encounter (HOSPITAL_COMMUNITY): Payer: Self-pay | Admitting: Psychiatry

## 2021-08-29 ENCOUNTER — Telehealth (INDEPENDENT_AMBULATORY_CARE_PROVIDER_SITE_OTHER): Payer: Medicaid Other | Admitting: Psychiatry

## 2021-08-29 DIAGNOSIS — F900 Attention-deficit hyperactivity disorder, predominantly inattentive type: Secondary | ICD-10-CM | POA: Diagnosis not present

## 2021-08-29 DIAGNOSIS — F3130 Bipolar disorder, current episode depressed, mild or moderate severity, unspecified: Secondary | ICD-10-CM | POA: Diagnosis not present

## 2021-08-29 DIAGNOSIS — E119 Type 2 diabetes mellitus without complications: Secondary | ICD-10-CM | POA: Diagnosis not present

## 2021-08-29 MED ORDER — FLUOXETINE HCL 40 MG PO CAPS: ORAL_CAPSULE | ORAL | 2 refills | Status: DC

## 2021-08-29 MED ORDER — FLUOXETINE HCL 20 MG PO CAPS: 20.0000 mg | ORAL_CAPSULE | Freq: Every day | ORAL | 2 refills | Status: DC

## 2021-08-29 MED ORDER — ALPRAZOLAM 1 MG PO TABS: 1.0000 mg | ORAL_TABLET | Freq: Two times a day (BID) | ORAL | 2 refills | Status: DC | PRN

## 2021-08-29 MED ORDER — AMPHETAMINE-DEXTROAMPHETAMINE 30 MG PO TABS: 30.0000 mg | ORAL_TABLET | Freq: Two times a day (BID) | ORAL | 0 refills | Status: DC

## 2021-08-29 NOTE — Progress Notes (Signed)
Virtual Visit via Telephone Note  I connected with Jane [name] on 08/29/21 at 10:00 AM EDT by telephone and verified that I am speaking with the correct person using two identifiers.  Location: Patient: home Provider: office   I discussed the limitations, risks, security and privacy concerns of performing an evaluation and management service by telephone and the availability of in person appointments. I also discussed with the patient that there may be a patient responsible charge related to this service. The patient expressed understanding and agreed to proceed.     I discussed the assessment and treatment plan with the patient. The patient was provided an opportunity to ask questions and all were answered. The patient agreed with the plan and demonstrated an understanding of the instructions.   The patient was advised to call back or seek an in-person evaluation if the symptoms worsen or if the condition fails to improve as anticipated.  I provided 15 minutes of non-face-to-face time during this encounter.   [name], MD  [facility] MD/PA/NP OP Progress Note  08/29/2021 11:04 AM Jane [name]  MRN:  [id number]  Chief Complaint:  Chief Complaint   Anxiety; Depression; Follow-up    HPI: This patient is a 55 year old divorced white female who lives alone in [place].  She is on disability due to bipolar disorder.  The patient returns for follow-up after 4 weeks.  Last time she was complaining of feeling more depressed although she was not sure why.  I did increase her Prozac to 60 mg daily and she states that her mood is much better.  However right now she is very frustrated.  Her sister is moved down from [place] to stay with her.  She states that her sister is "dirty and lazy".  She has 2 cats and a dog and does not clean up after them and has not been helping much with the housework.  Also her sister currently has COVID and infected the patient's 2 daughters as well as a family  friend.  Fortunately the patient does not have any symptoms herself.  She is hoping her sister will only stay temporarily until she finds her own place but this might take weeks or months.  She is trying to set limits and rules with her sister.  She denies significant depression anxiety or suicidal ideation and the Adderall continues to help her with focus Visit Diagnosis:    ICD-10-CM   1. Bipolar I disorder, most recent episode depressed ([place])  F31.30     2. Attention deficit hyperactivity disorder (ADHD), predominantly inattentive type  F90.0       Past Psychiatric History: Long-term outpatient treatment  Past Medical History:  Past Medical History:  Diagnosis Date   ADD (attention deficit disorder)    Anxiety    Back pain    Bilateral swelling of feet    Bipolar 1 disorder (HCC)    Bipolar 1 disorder (HCC)    Bipolar disorder (HCC)    Chewing difficulty    Chronic fatigue syndrome    Chronic kidney disease    Stage 3 kidney disease;dx by Dr. Sinda Du.    Constipation    Depression    Diabetes mellitus without complication (HCC)    diet controlled   Dyspnea    with exertion   GERD (gastroesophageal reflux disease)    Headache    migraines   High cholesterol    History of blood clots    History of DVT (deep vein thrombosis)  left leg   Hypertension    states under control with meds., has been on med. x 2 years   Hypothyroidism    Joint pain    Neuropathy    Obsessive-compulsive disorder    Peripheral vascular disease ([place])    Prediabetes    Respiratory failure requiring intubation ([place])    Restless leg syndrome    Shortness of breath    Sleep apnea    Thrombocytopenia ([place]) 09/15/2019   Trigger thumb of left hand 01/2018   Trigger thumb of right hand     Past Surgical History:  Procedure Laterality Date   ABDOMINAL HYSTERECTOMY  06/2016   complete   APPLICATION OF WOUND VAC Left 04/27/2019   Procedure: APPLICATION OF WOUND VAC LEFT GROIN;   Surgeon: Waynetta Sandy, MD;  Location: [place];  Service: Vascular;  Laterality: Left;   AV FISTULA PLACEMENT Left 11/24/2018   Procedure: ARTERIOVENOUS (AV) FISTULA CREATION LEFT SFA TO LEFT FEMORAL VEIN;  Surgeon: Waynetta Sandy, MD;  Location: [place];  Service: Vascular;  Laterality: Left;   BACK SURGERY     CHOLECYSTECTOMY     COLONOSCOPY WITH PROPOFOL N/A 11/22/2019   Procedure: COLONOSCOPY WITH PROPOFOL;  Surgeon: [name], MD;  Location: [facility] ENDOSCOPY;  Service: Gastroenterology;  Laterality: N/A;   FEMORAL ARTERY EXPLORATION  03/30/2019   Procedure: Left Common Femoral Artery and Vein Exploration;  Surgeon: Waynetta Sandy, MD;  Location: [place];  Service: Vascular;;   FEMORAL-FEMORAL BYPASS GRAFT Left 11/24/2018   Procedure: BYPASS GRAFT FEMORAL-FEMORAL VENOUS LEFT TO RIGHT PALMA PROCEDURE USING CRYOVEIN;  Surgeon: Waynetta Sandy, MD;  Location: [place];  Service: Vascular;  Laterality: Left;   GROIN DEBRIDEMENT Left 04/27/2019   Procedure: GROIN DEBRIDEMENT;  Surgeon: Waynetta Sandy, MD;  Location: [place];  Service: Vascular;  Laterality: Left;   INSERTION OF ILIAC STENT  03/30/2019   Procedure: Stent of left common, external iliac veins and left common femoral vein;  Surgeon: Waynetta Sandy, MD;  Location: [place];  Service: Vascular;;   KNEE ARTHROSCOPY WITH MENISCAL REPAIR Left 11/14/2020   Procedure: LEFT KNEE ARTHROSCOPY WITH PARTIAL MEDIAL MENISCECTOMY;  Surgeon: Carole Civil, MD;  Location: [facility];  Service: Orthopedics;  Laterality: Left;   LOWER EXTREMITY VENOGRAPHY N/A 08/17/2018   Procedure: LOWER EXTREMITY VENOGRAPHY - Central Venogram;  Surgeon: Waynetta Sandy, MD;  Location: [place] [facility];  Service: Cardiovascular;  Laterality: N/A;   LOWER EXTREMITY VENOGRAPHY Bilateral 03/09/2019   Procedure: LOWER EXTREMITY VENOGRAPHY;  Surgeon: Waynetta Sandy, MD;  Location: [place] [facility];   Service: Cardiovascular;  Laterality: Bilateral;   LOWER EXTREMITY VENOGRAPHY Left 08/16/2019   Procedure: LOWER EXTREMITY VENOGRAPHY;  Surgeon: Waynetta Sandy, MD;  Location: [place] [facility];  Service: Cardiovascular;  Laterality: Left;   LUMBAR FUSION  11/21/2000   L5-S1   LUMBAR SPINE SURGERY     x 2 others   PATCH ANGIOPLASTY Left 03/30/2019   Procedure: Patch Angioplasty of the Left Common Femoral Vein using Venosure Biologic patch;  Surgeon: Waynetta Sandy, MD;  Location: [place];  Service: Vascular;  Laterality: Left;   PERIPHERAL VASCULAR INTERVENTION Left 08/16/2019   Procedure: PERIPHERAL VASCULAR INTERVENTION;  Surgeon: Waynetta Sandy, MD;  Location: [place] [facility];  Service: Cardiovascular;  Laterality: Left;  common femoral/femoral vein stent   TRIGGER FINGER RELEASE Right 12/01/2017   Procedure: RELEASE TRIGGER FINGER/A-1 PULLEY RIGHT THUMB;  Surgeon: Leanora Cover, MD;  Location: [place];  Service: Orthopedics;  Laterality: Right;   TRIGGER FINGER RELEASE Left 01/26/2018   Procedure: LEFT TRIGGER THUMB RELEASE;  Surgeon: Leanora Cover, MD;  Location: [place];  Service: Orthopedics;  Laterality: Left;   ULTRASOUND GUIDANCE FOR VASCULAR ACCESS Right 03/30/2019   Procedure: Ultrasound-guided cannulation right internal jugular vein;  Surgeon: Waynetta Sandy, MD;  Location: [place];  Service: Vascular;  Laterality: Right;    Family Psychiatric History: see below  Family History:  Family History  Problem Relation Age of Onset   Heart disease Mother    Hyperlipidemia Mother    Hypertension Mother    Bipolar disorder Mother    Stroke Mother    Depression Mother    Sleep apnea Mother    Obesity Mother    Diabetes Father    Heart disease Father    Hyperlipidemia Father    Hypertension Father    Sleep apnea Father    Obesity Father    Drug abuse Daughter    ADD / ADHD Daughter    Drug abuse  Daughter    Anxiety disorder Daughter    Bipolar disorder Daughter    Hypertension Sister    Hypertension Brother    Hyperlipidemia Brother    Heart disease Brother    Bipolar disorder Maternal Aunt    Suicidality Maternal Aunt     Social History:  Social History   Socioeconomic History   Marital status: Divorced    Spouse name: Not on file   Number of children: 3   Years of education: Not on file   Highest education level: Not on file  Occupational History   Not on file  Tobacco Use   Smoking status: Never   Smokeless tobacco: Never  Vaping Use   Vaping Use: Never used  Substance and Sexual Activity   Alcohol use: No   Drug use: No   Sexual activity: Not Currently    Partners: Male    Birth control/protection: Surgical  Other Topics Concern   Not on file  Social History Narrative   Not on file   Social Determinants of Health   Financial Resource Strain: High Risk   Difficulty of Paying Living Expenses: Hard  Food Insecurity: Food Insecurity Present   Worried About Running Out of Food in the Last Year: Sometimes true   Ran Out of Food in the Last Year: Never true  Transportation Needs: No Transportation Needs   Lack of Transportation (Medical): No   Lack of Transportation (Non-Medical): No  Physical Activity: Inactive   Days of Exercise per Week: 0 days   Minutes of Exercise per Session: 0 min  Stress: Stress Concern Present   Feeling of Stress : Very much  Social Connections: Socially Isolated   Frequency of Communication with Friends and Family: More than three times a week   Frequency of Social Gatherings with Friends and Family: Three times a week   Attends Religious Services: Never   Active Member of Clubs or Organizations: No   Attends Archivist Meetings: Never   Marital Status: Divorced    Allergies:  Allergies  Allergen Reactions   Aripiprazole Other (See Comments)    BECOMES  VIOLENT    Seroquel [Quetiapine Fumarate] Other (See  Comments)    BECOMES VIOLENT   Chlorpromazine Other (See Comments)    SEVERE ANXIETY    Gabapentin Other (See Comments)    NIGHTMARES     Metabolic Disorder Labs: Lab  Results  Component Value Date   HGBA1C 6.3 (H) 03/22/2021   MPG 134.11 03/26/2019   No results found for: PROLACTIN Lab Results  Component Value Date   CHOL 203 (H) 03/29/2020   TRIG 296 (H) 03/29/2020   HDL 37 (L) 03/29/2020   CHOLHDL 5.0 (H) 10/25/2019   LDLCALC 114 (H) 03/29/2020   LDLCALC 127 (H) 10/25/2019   Lab Results  Component Value Date   TSH 0.107 (L) 05/18/2020   TSH 0.275 (L) 03/29/2020    Therapeutic Level Labs: No results found for: LITHIUM Lab Results  Component Value Date   VALPROATE 61.2 03/23/2014   No components found for:  CBMZ  Current Medications: Current Outpatient Medications  Medication Sig Dispense Refill   amphetamine-dextroamphetamine (ADDERALL) 30 MG tablet Take 1 tablet by mouth 2 (two) times daily. 60 tablet 0   ALPRAZolam (XANAX) 1 MG tablet Take 1 tablet (1 mg total) by mouth 2 (two) times daily as needed for anxiety. 60 tablet 2   amitriptyline (ELAVIL) 25 MG tablet Take 25 mg by mouth at bedtime.     amLODipine (NORVASC) 5 MG tablet Take 1 tablet (5 mg total) by mouth daily. 90 tablet 0   amoxicillin (AMOXIL) 500 MG capsule Take 500 mg by mouth every 8 (eight) hours.     amphetamine-dextroamphetamine (ADDERALL) 30 MG tablet Take 1 tablet by mouth 2 (two) times daily. 60 tablet 0   aspirin EC 81 MG tablet Take 81 mg by mouth daily.     atorvastatin (LIPITOR) 20 MG tablet Take 1 tablet (20 mg total) by mouth daily. Please schedule office visit before any future refills. 90 tablet 0   capsaicin (ZOSTRIX) 0.025 % cream Apply topically 2 (two) times daily. 60 g 0   cetirizine (ZYRTEC) 10 MG tablet TAKE 1 TABLET BY MOUTH DAILY 90 tablet 1   cycloSPORINE (RESTASIS) 0.05 % ophthalmic emulsion Place 1 drop into both eyes 2 (two) times daily.     Dulaglutide (TRULICITY)  0.96 GE/3.6OQ SOPN Inject 0.75 mg into the skin once a week. 2 mL 0   FLUoxetine (PROZAC) 20 MG capsule Take 1 capsule (20 mg total) by mouth daily. 30 capsule 2   FLUoxetine (PROZAC) 40 MG capsule TAKE 1 TABLET BY MOUTH DAILY 90 capsule 2   GARLIC PO Take 9,476 mg by mouth daily.     hydrochlorothiazide (HYDRODIURIL) 25 MG tablet Take 1 tablet (25 mg total) by mouth daily. Please schedule office visit before any future refills. 90 tablet 0   HYDROcodone-acetaminophen (NORCO) 10-325 MG tablet Take 1 tablet by mouth every 6 (six) hours as needed. To last 30 days from fill date 120 tablet 0   [START ON 09/21/2021] HYDROcodone-acetaminophen (NORCO) 10-325 MG tablet Take 1 tablet by mouth every 6 (six) hours as needed. To last 30 days from fill date 120 tablet 0   levothyroxine (SYNTHROID) 125 MCG tablet Take 125 mcg by mouth daily before breakfast.     LINZESS 145 MCG CAPS capsule TAKE ONE CAPSULE BY MOUTH DAILY (Patient taking differently: Take 145 mcg by mouth daily as needed.) 90 capsule 3   methocarbamol (ROBAXIN) 750 MG tablet Take 1 tablet (750 mg total) by mouth 2 (two) times daily as needed for muscle spasms. 60 tablet 2   Multiple Vitamin (MULTIVITAMIN) capsule Take 1 capsule by mouth daily.     Olopatadine HCl 0.2 % SOLN Place 1 drop into both eyes daily.     Omega-3 Fatty Acids (OMEGA-3 FISH OIL PO)  Take 600 mg by mouth daily.     pantoprazole (PROTONIX) 40 MG tablet TAKE 1 TABLET BY MOUTH DAILY (Patient taking differently: Take 40 mg by mouth daily.) 90 tablet 3   potassium chloride (KLOR-CON) 10 MEQ tablet TAKE 1 TABLET BY MOUTH THREE TIMES DAILY 90 tablet 1   pregabalin (LYRICA) 100 MG capsule Take 100 mg by mouth 4 (four) times daily.     rOPINIRole (REQUIP) 2 MG tablet TAKE 1 TABLET BY MOUTH TWICE DAILY (Patient taking differently: Take 2 mg by mouth 2 (two) times daily.) 180 tablet 3   triamcinolone cream (KENALOG) 0.1 % Apply topically 2 (two) times daily.     Vibegron (GEMTESA) 75  MG TABS Take 75 mg by mouth daily. 30 tablet 11   warfarin (COUMADIN) 5 MG tablet TAKE 1 AND 1/2 TABLETS BY MOUTH ON TUEDAYS AND THURSDAYS, THEN TAKE 1 TABLET DAILY ON ALL OTHER DAYS (Patient taking differently: 1/2 tablet on Monday and Wednesday 1 tablet all other days) 60 tablet 3   No current facility-administered medications for this visit.     Musculoskeletal: Strength & Muscle Tone: na Gait & Station: na Patient leans: N/A  Psychiatric Specialty Exam: Review of Systems  Musculoskeletal:  Positive for arthralgias and back pain.  All other systems reviewed and are negative.  There were no vitals taken for this visit.There is no height or weight on file to calculate BMI.  General Appearance: NA  Eye Contact:  NA  Speech:  Clear and Coherent  Volume:  Normal  Mood:  Euthymic  Affect:  NA  Thought Process:  Goal Directed  Orientation:  Full (Time, Place, and Person)  Thought Content: Rumination   Suicidal Thoughts:  No  Homicidal Thoughts:  No  Memory:  Immediate;   Good Recent;   Good Remote;   Good  Judgement:  Good  Insight:  Good  Psychomotor Activity:  Decreased  Concentration:  Concentration: Good and Attention Span: Good  Recall:  Good  Fund of Knowledge: Good  Language: Good  Akathisia:  No  Handed:  Right  AIMS (if indicated): not done  Assets:  Communication Skills Desire for Improvement Resilience Social Support Talents/Skills  ADL's:  Intact  Cognition: WNL  Sleep:  Good   Screenings: PHQ2-9    Flowsheet Row Video Visit from 08/29/2021 in [facility] [place] Office Visit from 07/17/2021 in [place] Video Visit from 07/12/2021 in [facility] Office Visit from 05/31/2021 in [place] Video Visit from 05/29/2021 in [facility]  PHQ-2 Total Score 0 0 6 4 0  PHQ-9 Total Score  -- -- 16 18 --      Flowsheet Row Video Visit from 08/29/2021 in [facility] Video Visit from 07/12/2021 in [facility] Video Visit from 05/29/2021 in [facility] No Risk No Risk No Risk        Assessment and Plan: This patient is a 55 year old female with a history depression anxiety and ADD.  She is frustrated with her sister but no longer depressed since we increased Prozac to 60 mg daily.  This will be continued.  She will also continue Xanax 1 mg twice daily as needed for anxiety and Adderall 30 mg twice daily for ADD.  She will return to see me in 2 months   [name], MD 08/29/2021, 11:04 AM

## 2021-08-30 ENCOUNTER — Encounter (INDEPENDENT_AMBULATORY_CARE_PROVIDER_SITE_OTHER): Payer: Self-pay

## 2021-08-30 ENCOUNTER — Other Ambulatory Visit: Payer: Self-pay

## 2021-08-30 ENCOUNTER — Other Ambulatory Visit: Payer: Self-pay | Admitting: Family Medicine

## 2021-08-30 ENCOUNTER — Encounter (INDEPENDENT_AMBULATORY_CARE_PROVIDER_SITE_OTHER): Payer: Self-pay | Admitting: Family Medicine

## 2021-08-30 ENCOUNTER — Telehealth (INDEPENDENT_AMBULATORY_CARE_PROVIDER_SITE_OTHER): Payer: Medicaid Other | Admitting: Family Medicine

## 2021-08-30 DIAGNOSIS — E119 Type 2 diabetes mellitus without complications: Secondary | ICD-10-CM | POA: Diagnosis not present

## 2021-08-30 DIAGNOSIS — R531 Weakness: Secondary | ICD-10-CM | POA: Diagnosis not present

## 2021-08-30 DIAGNOSIS — R059 Cough, unspecified: Secondary | ICD-10-CM | POA: Diagnosis not present

## 2021-08-30 DIAGNOSIS — R519 Headache, unspecified: Secondary | ICD-10-CM | POA: Diagnosis not present

## 2021-08-30 DIAGNOSIS — U071 COVID-19: Secondary | ICD-10-CM | POA: Diagnosis not present

## 2021-08-30 NOTE — Telephone Encounter (Signed)
Requested medication (s) are due for refill today Yes  Last ordered 07/13/21 #90 tabs with one refill  Requested medication (s) are on the active medication list Yes  Future visit scheduled No  Note to clinic-LOV 5 months ago. Most recent potassium 04/12/21 3.8, Creatinine 1.23. Routing to physician for review.   Requested Prescriptions  Pending Prescriptions Disp Refills   potassium chloride (KLOR-CON) 10 MEQ tablet [Pharmacy Med Name: potassium chloride ER 10 mEq tablet,extended release(part/cryst)] 90 tablet 1    Sig: TAKE 1 TABLET BY MOUTH THREE TIMES DAILY     Endocrinology:  Minerals - Potassium Supplementation Failed - 08/30/2021  8:01 AM      Failed - Cr in normal range and within 360 days    Creatinine, Ser  Date Value Ref Range Status  04/12/2021 1.23 (H) 0.44 - 1.00 mg/dL Final          Passed - K in normal range and within 360 days    Potassium  Date Value Ref Range Status  04/12/2021 3.8 3.5 - 5.1 mmol/L Final          Passed - Valid encounter within last 12 months    Recent Outpatient Visits           3 months ago Urinary frequency   [facility], Dionne Bucy, MD   5 months ago Polyuria   [place], PA-C   5 months ago Pneumonia of left lung due to infectious organism, unspecified part of lung   [place], NP   7 months ago History of recurrent deep vein thrombosis (DVT)   [facility], Dionne Bucy, MD   8 months ago [place], [place], [place]

## 2021-08-31 DIAGNOSIS — E119 Type 2 diabetes mellitus without complications: Secondary | ICD-10-CM | POA: Diagnosis not present

## 2021-09-01 DIAGNOSIS — E119 Type 2 diabetes mellitus without complications: Secondary | ICD-10-CM | POA: Diagnosis not present

## 2021-09-02 DIAGNOSIS — E119 Type 2 diabetes mellitus without complications: Secondary | ICD-10-CM | POA: Diagnosis not present

## 2021-09-03 DIAGNOSIS — E119 Type 2 diabetes mellitus without complications: Secondary | ICD-10-CM | POA: Diagnosis not present

## 2021-09-04 DIAGNOSIS — E119 Type 2 diabetes mellitus without complications: Secondary | ICD-10-CM | POA: Diagnosis not present

## 2021-09-05 ENCOUNTER — Ambulatory Visit: Payer: Medicaid Other

## 2021-09-05 DIAGNOSIS — E119 Type 2 diabetes mellitus without complications: Secondary | ICD-10-CM | POA: Diagnosis not present

## 2021-09-06 DIAGNOSIS — E119 Type 2 diabetes mellitus without complications: Secondary | ICD-10-CM | POA: Diagnosis not present

## 2021-09-07 DIAGNOSIS — E119 Type 2 diabetes mellitus without complications: Secondary | ICD-10-CM | POA: Diagnosis not present

## 2021-09-08 DIAGNOSIS — E119 Type 2 diabetes mellitus without complications: Secondary | ICD-10-CM | POA: Diagnosis not present

## 2021-09-09 DIAGNOSIS — E119 Type 2 diabetes mellitus without complications: Secondary | ICD-10-CM | POA: Diagnosis not present

## 2021-09-16 DIAGNOSIS — N390 Urinary tract infection, site not specified: Secondary | ICD-10-CM | POA: Diagnosis not present

## 2021-09-16 DIAGNOSIS — L089 Local infection of the skin and subcutaneous tissue, unspecified: Secondary | ICD-10-CM | POA: Diagnosis not present

## 2021-09-16 DIAGNOSIS — R350 Frequency of micturition: Secondary | ICD-10-CM | POA: Diagnosis not present

## 2021-09-16 DIAGNOSIS — R490 Dysphonia: Secondary | ICD-10-CM | POA: Diagnosis not present

## 2021-09-18 ENCOUNTER — Other Ambulatory Visit: Payer: Medicaid Other | Admitting: Urology

## 2021-09-19 ENCOUNTER — Ambulatory Visit (INDEPENDENT_AMBULATORY_CARE_PROVIDER_SITE_OTHER): Payer: Medicaid Other | Admitting: Physician Assistant

## 2021-09-19 ENCOUNTER — Other Ambulatory Visit: Payer: Self-pay

## 2021-09-19 DIAGNOSIS — Z86718 Personal history of other venous thrombosis and embolism: Secondary | ICD-10-CM | POA: Diagnosis not present

## 2021-09-19 LAB — POCT INR: INR: 2 (ref 2.0–3.0)

## 2021-09-19 NOTE — Patient Instructions (Signed)
Description   Continue 5mg  daily except 2.5mg  Monday and Wednesday.  Recheck in four weeks.

## 2021-09-21 DIAGNOSIS — F3132 Bipolar disorder, current episode depressed, moderate: Secondary | ICD-10-CM | POA: Diagnosis not present

## 2021-09-24 ENCOUNTER — Telehealth (INDEPENDENT_AMBULATORY_CARE_PROVIDER_SITE_OTHER): Payer: Self-pay | Admitting: Family Medicine

## 2021-09-24 ENCOUNTER — Encounter (INDEPENDENT_AMBULATORY_CARE_PROVIDER_SITE_OTHER): Payer: Self-pay | Admitting: Family Medicine

## 2021-09-24 ENCOUNTER — Ambulatory Visit (INDEPENDENT_AMBULATORY_CARE_PROVIDER_SITE_OTHER): Payer: Medicaid Other | Admitting: Family Medicine

## 2021-09-24 ENCOUNTER — Encounter (INDEPENDENT_AMBULATORY_CARE_PROVIDER_SITE_OTHER): Payer: Self-pay

## 2021-09-24 ENCOUNTER — Other Ambulatory Visit: Payer: Self-pay

## 2021-09-24 VITALS — BP 119/82 | HR 68 | Temp 98.4°F | Ht 70.0 in | Wt 272.0 lb

## 2021-09-24 DIAGNOSIS — Z6841 Body Mass Index (BMI) 40.0 and over, adult: Secondary | ICD-10-CM | POA: Diagnosis not present

## 2021-09-24 DIAGNOSIS — E1165 Type 2 diabetes mellitus with hyperglycemia: Secondary | ICD-10-CM | POA: Diagnosis not present

## 2021-09-24 DIAGNOSIS — E038 Other specified hypothyroidism: Secondary | ICD-10-CM | POA: Diagnosis not present

## 2021-09-24 MED ORDER — OZEMPIC (0.25 OR 0.5 MG/DOSE) 2 MG/1.5ML ~~LOC~~ SOPN: 0.2500 mg | PEN_INJECTOR | SUBCUTANEOUS | 0 refills | Status: DC

## 2021-09-24 NOTE — Progress Notes (Signed)
Chief Complaint:   OBESITY Jane [name] is here to discuss her progress with her obesity treatment plan along with follow-up of her obesity related diagnoses. Jane [name] is on practicing portion control and making smarter food choices, such as increasing vegetables and decreasing simple carbohydrates and states she is following her eating plan approximately 25% of the time. Jane [name] states she is walking 15-30 minutes 4 times per week.  Today's visit was #: 21 Starting weight: 283 lbs Starting date: 03/29/2020 Today's weight: 272 lbs Today's date: 09/24/2021 Total lbs lost to date: 11 Total lbs lost since last in-office visit: 0  Interim History: Jane [name] had COVID at time of last appt. Her sister moved down here and brought COVID to the house. She is looking to move to [facility]. Pt is spending Thanksgiving with her best friend and ex-boyfriend. She voices she is dealing with UTI currently.  Subjective:   1. Type 2 diabetes mellitus with hyperglycemia, without long-term current use of insulin (Jane [name]) Trulicity was not approved (formulary meds not tried). Steele's last [name] was 6.3. She is not on Metformin due to GI side effects. She has a history of recurrent UTI's, so she cannot tolerate SGLT-2.  2. Other specified hypothyroidism Jane [name]'s last TSH was elevated at 5.227. She is on levothyroxine 125 mcg.  Assessment/Plan:   1. Type 2 diabetes mellitus with hyperglycemia, without long-term current use of insulin (HCC) Jane [name] will start Ozempic 4.01 mg and stop Trulicity. Good blood sugar control is important to decrease the likelihood of diabetic complications such as nephropathy, neuropathy, limb loss, blindness, coronary artery disease, and death. Intensive lifestyle modification including diet, exercise and weight loss are the first line of treatment for diabetes.   Start- Semaglutide,0.25 or 0.5MG /DOS, (OZEMPIC, 0.25 OR 0.5 MG/DOSE,) 2 MG/1.5ML SOPN; Inject 0.25 mg into the skin once a week.   Dispense: 1.5 mL; Refill: 0  2. Other specified hypothyroidism Check labs in January 2023. Patient with long-standing hypothyroidism, on levothyroxine therapy. She appears euthyroid. Orders and follow up as documented in patient record.  Counseling Good thyroid control is important for overall health. Supratherapeutic thyroid levels are dangerous and will not improve weight loss results. The correct way to take levothyroxine is fasting, with water, separated by at least 30 minutes from breakfast, and separated by more than 4 hours from calcium, iron, multivitamins, acid reflux medications (PPIs).   3. Obesity BMI today is 54  Jane [name] is currently in the action stage of change. As such, her goal is to continue with weight loss efforts. She has agreed to practicing portion control and making smarter food choices, such as increasing vegetables and decreasing simple carbohydrates.   Exercise goals:  As is  Behavioral modification strategies: increasing lean protein intake, dealing with family or coworker sabotage, and planning for success.  Jane [name] has agreed to follow-up with our clinic in 3-4 weeks. She was informed of the importance of frequent follow-up visits to maximize her success with intensive lifestyle modifications for her multiple health conditions.   Objective:   Blood pressure 119/82, pulse 68, temperature 98.4 F (36.9 C), height 5\' 10"  (1.778 m), weight 272 lb (123.4 kg), SpO2 97 %. Body mass index is 39.03 kg/m.  General: Cooperative, alert, well developed, in no acute distress. HEENT: Conjunctivae and lids unremarkable. Cardiovascular: Regular rhythm.  Lungs: Normal work of breathing. Neurologic: No focal deficits.   Lab Results  Component Value Date   CREATININE 1.23 (H) 04/12/2021   BUN 33 (H) 04/12/2021  NA 140 04/12/2021   K 3.8 04/12/2021   CL 101 04/12/2021   CO2 32 04/12/2021   Lab Results  Component Value Date   ALT 20 04/12/2021   AST 17 04/12/2021    ALKPHOS 95 04/12/2021   BILITOT 0.6 04/12/2021   Lab Results  Component Value Date   HGBA1C 6.3 (H) 03/22/2021   HGBA1C 5.8 (A) 10/24/2020   HGBA1C 6.2 (H) 03/29/2020   HGBA1C 6.3 (H) 10/25/2019   HGBA1C 6.3 (H) 03/26/2019   Lab Results  Component Value Date   INSULIN 21.5 03/29/2020   Lab Results  Component Value Date   TSH 0.107 (L) 05/18/2020   Lab Results  Component Value Date   CHOL 203 (H) 03/29/2020   HDL 37 (L) 03/29/2020   LDLCALC 114 (H) 03/29/2020   TRIG 296 (H) 03/29/2020   CHOLHDL 5.0 (H) 10/25/2019   Lab Results  Component Value Date   VD25OH 50.60 01/01/2021   VD25OH 45.30 10/17/2020   VD25OH 37.20 07/18/2020   Lab Results  Component Value Date   WBC 9.3 08/16/2021   HGB 15.2 (H) 08/16/2021   HCT 45.3 08/16/2021   MCV 93.8 08/16/2021   PLT 156 08/16/2021   Lab Results  Component Value Date   IRON 93 08/16/2021   TIBC 348 08/16/2021   FERRITIN 137 08/16/2021    Attestation Statements:   Reviewed by clinician on day of visit: allergies, medications, problem list, medical history, surgical history, family history, social history, and previous encounter notes.  Coral Ceo, CMA, am acting as transcriptionist for Coralie Common, MD.  I have reviewed the above documentation for accuracy and completeness, and I agree with the above. - Coralie Common, MD

## 2021-09-24 NOTE — Telephone Encounter (Signed)
Prior authorization approved for Ozempic. Patient sent mychart message.

## 2021-09-26 DIAGNOSIS — K760 Fatty (change of) liver, not elsewhere classified: Secondary | ICD-10-CM | POA: Diagnosis not present

## 2021-09-26 DIAGNOSIS — D696 Thrombocytopenia, unspecified: Secondary | ICD-10-CM | POA: Diagnosis not present

## 2021-09-26 DIAGNOSIS — R1011 Right upper quadrant pain: Secondary | ICD-10-CM | POA: Diagnosis not present

## 2021-09-26 DIAGNOSIS — N3001 Acute cystitis with hematuria: Secondary | ICD-10-CM | POA: Diagnosis not present

## 2021-09-30 ENCOUNTER — Other Ambulatory Visit (HOSPITAL_COMMUNITY): Payer: Self-pay | Admitting: Psychiatry

## 2021-10-01 ENCOUNTER — Encounter: Payer: Self-pay | Admitting: Family Medicine

## 2021-10-01 ENCOUNTER — Telehealth (INDEPENDENT_AMBULATORY_CARE_PROVIDER_SITE_OTHER): Payer: Medicaid Other | Admitting: Family Medicine

## 2021-10-01 DIAGNOSIS — I152 Hypertension secondary to endocrine disorders: Secondary | ICD-10-CM

## 2021-10-01 DIAGNOSIS — R319 Hematuria, unspecified: Secondary | ICD-10-CM | POA: Diagnosis not present

## 2021-10-01 DIAGNOSIS — R0609 Other forms of dyspnea: Secondary | ICD-10-CM | POA: Diagnosis not present

## 2021-10-01 DIAGNOSIS — Z7901 Long term (current) use of anticoagulants: Secondary | ICD-10-CM | POA: Diagnosis not present

## 2021-10-01 DIAGNOSIS — E1159 Type 2 diabetes mellitus with other circulatory complications: Secondary | ICD-10-CM

## 2021-10-01 DIAGNOSIS — N390 Urinary tract infection, site not specified: Secondary | ICD-10-CM | POA: Diagnosis not present

## 2021-10-01 MED ORDER — HYDROCHLOROTHIAZIDE 25 MG PO TABS: 25.0000 mg | ORAL_TABLET | Freq: Every day | ORAL | 0 refills | Status: DC

## 2021-10-01 MED ORDER — ATORVASTATIN CALCIUM 20 MG PO TABS: 20.0000 mg | ORAL_TABLET | Freq: Every day | ORAL | 0 refills | Status: DC

## 2021-10-01 MED ORDER — WARFARIN SODIUM 5 MG PO TABS: ORAL_TABLET | ORAL | 3 refills | Status: DC

## 2021-10-01 NOTE — Assessment & Plan Note (Signed)
Upcoming CPE - will recheck at that visit and complete labs Refilled meds to get her to that appt

## 2021-10-01 NOTE — Assessment & Plan Note (Signed)
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

## 2021-10-01 NOTE — Assessment & Plan Note (Signed)
Upcoming f/u with urology for further eval

## 2021-10-01 NOTE — Progress Notes (Signed)
MyChart Video Visit    Virtual Visit via Video Note   This visit type was conducted due to national recommendations for restrictions regarding the COVID-19 Pandemic (e.g. social distancing) in an effort to limit this patient's exposure and mitigate transmission in our community. This patient is at least at moderate risk for complications without adequate follow up. This format is felt to be most appropriate for this patient at this time. Physical exam was limited by quality of the video and audio technology used for the visit.    Patient location: home Provider location: [place] involved in the visit: patient, provider   I discussed the limitations of evaluation and management by telemedicine and the availability of in person appointments. The patient expressed understanding and agreed to proceed.  Patient: Jane [name]   DOB: [birth year]   55 y.o. Female  MRN: [id number] Visit Date: 10/01/2021  Today's healthcare provider: Lavon Paganini, MD   Chief Complaint  Patient presents with   Follow-up    Subjective    HPI  Patient currently has UTI and INR was 1.8 on 23rd Patient reports she has been on 2 has been on different antibiotics. Was taking one for 10 days but it did not work it went up to her right kidney so then they switched her medication. Urgent care initially started Keflex. [facility] witched it.  She is currently taking cefpodoxine 200 mg  for 10 days and this is currently day 4. 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 Was last seen at [facility] at [place]. Symptoms do not seem to be getting better but will see her urologist on Wednesday. Patient would like INR checked again due to all med changes. Went to ER to see if she could have checked and waiting time was 4-5 hrs. Will be in [place] on Wednesday and can come in as early as 9:30 for INR check. Has urology appt on Wednesday.   SOB with exertion for last  few months  Medications: Outpatient Medications Prior to Visit  Medication Sig   ALPRAZolam (XANAX) 1 MG tablet Take 1 tablet (1 mg total) by mouth 2 (two) times daily as needed for anxiety.   amitriptyline (ELAVIL) 25 MG tablet Take 25 mg by mouth at bedtime.   amLODipine (NORVASC) 5 MG tablet Take 1 tablet (5 mg total) by mouth daily.   amphetamine-dextroamphetamine (ADDERALL) 30 MG tablet Take 1 tablet by mouth 2 (two) times daily.   amphetamine-dextroamphetamine (ADDERALL) 30 MG tablet Take 1 tablet by mouth 2 (two) times daily.   aspirin EC 81 MG tablet Take 81 mg by mouth daily.   capsaicin (ZOSTRIX) 0.025 % cream Apply topically 2 (two) times daily.   cefpodoxime (VANTIN) 200 MG tablet Take 200 mg by mouth 2 (two) times daily.   cetirizine (ZYRTEC) 10 MG tablet TAKE 1 TABLET BY MOUTH DAILY   cycloSPORINE (RESTASIS) 0.05 % ophthalmic emulsion Place 1 drop into both eyes 2 (two) times daily.   FLUoxetine (PROZAC) 20 MG capsule Take 1 capsule (20 mg total) by mouth daily.   FLUoxetine (PROZAC) 40 MG capsule TAKE 1 TABLET BY MOUTH DAILY   GARLIC PO Take 2,353 mg by mouth daily.   HYDROcodone-acetaminophen (NORCO) 10-325 MG tablet Take 1 tablet by mouth every 6 (six) hours as needed. To last 30 days from fill date   levothyroxine (SYNTHROID) 125 MCG tablet Take 125 mcg by mouth daily before breakfast.  LINZESS 145 MCG CAPS capsule TAKE ONE CAPSULE BY MOUTH DAILY (Patient taking differently: Take 145 mcg by mouth daily as needed.)   methocarbamol (ROBAXIN) 750 MG tablet Take 1 tablet (750 mg total) by mouth 2 (two) times daily as needed for muscle spasms.   Multiple Vitamin (MULTIVITAMIN) capsule Take 1 capsule by mouth daily.   Olopatadine HCl 0.2 % SOLN Place 1 drop into both eyes daily.   Omega-3 Fatty Acids (OMEGA-3 FISH OIL PO) Take 600 mg by mouth daily.   pantoprazole (PROTONIX) 40 MG tablet TAKE 1 TABLET BY MOUTH DAILY (Patient taking differently: Take 40 mg by mouth daily.)    potassium chloride (KLOR-CON) 10 MEQ tablet Take 1 tablet (10 mEq total) by mouth 3 (three) times daily. Please schedule office visit before any future refills.   pregabalin (LYRICA) 100 MG capsule Take 100 mg by mouth 4 (four) times daily.   rOPINIRole (REQUIP) 2 MG tablet TAKE 1 TABLET BY MOUTH TWICE DAILY (Patient taking differently: Take 2 mg by mouth 2 (two) times daily.)   Semaglutide,0.25 or 0.5MG /DOS, (OZEMPIC, 0.25 OR 0.5 MG/DOSE,) 2 MG/1.5ML SOPN Inject 0.25 mg into the skin once a week.   Vibegron (GEMTESA) 75 MG TABS Take 75 mg by mouth daily.   [DISCONTINUED] atorvastatin (LIPITOR) 20 MG tablet Take 1 tablet (20 mg total) by mouth daily. Please schedule office visit before any future refills.   [DISCONTINUED] hydrochlorothiazide (HYDRODIURIL) 25 MG tablet Take 1 tablet (25 mg total) by mouth daily. Please schedule office visit before any future refills.   [DISCONTINUED] warfarin (COUMADIN) 5 MG tablet TAKE 1 AND 1/2 TABLETS BY MOUTH ON TUEDAYS AND THURSDAYS, THEN TAKE 1 TABLET DAILY ON ALL OTHER DAYS (Patient taking differently: 1/2 tablet on Monday and Wednesday 1 tablet all other days)   No facility-administered medications prior to visit.    Review of Systems  Constitutional: Negative.   Respiratory:  Positive for shortness of breath.   Cardiovascular:  Negative for chest pain.   Last CBC Lab Results  Component Value Date   WBC 9.3 08/16/2021   HGB 15.2 (H) 08/16/2021   HCT 45.3 08/16/2021   MCV 93.8 08/16/2021   MCH 31.5 08/16/2021   RDW 13.7 08/16/2021   PLT 156 95/62/1308   Last metabolic panel Lab Results  Component Value Date   GLUCOSE 106 (H) 04/12/2021   NA 140 04/12/2021   K 3.8 04/12/2021   CL 101 04/12/2021   CO2 32 04/12/2021   BUN 33 (H) 04/12/2021   CREATININE 1.23 (H) 04/12/2021   GFRNONAA 52 (L) 04/12/2021   CALCIUM 9.5 04/12/2021   PHOS 4.0 04/04/2019   PROT 7.4 04/12/2021   ALBUMIN 3.8 04/12/2021   LABGLOB 2.9 03/22/2021   AGRATIO 1.5  03/22/2021   BILITOT 0.6 04/12/2021   ALKPHOS 95 04/12/2021   AST 17 04/12/2021   ALT 20 04/12/2021   ANIONGAP 7 04/12/2021   Last lipids Lab Results  Component Value Date   CHOL 203 (H) 03/29/2020   HDL 37 (L) 03/29/2020   LDLCALC 114 (H) 03/29/2020   TRIG 296 (H) 03/29/2020   CHOLHDL 5.0 (H) 10/25/2019   Last hemoglobin [name] Lab Results  Component Value Date   HGBA1C 6.3 (H) 03/22/2021   Last thyroid functions Lab Results  Component Value Date   TSH 0.107 (L) 05/18/2020   T3TOTAL 100 05/18/2020   T4TOTAL 9.2 05/18/2020   Last vitamin D Lab Results  Component Value Date   VD25OH 50.60 01/01/2021  Last vitamin B12 and Folate Lab Results  Component Value Date   VITAMINB12 369 01/01/2021   FOLATE 12.3 01/01/2021      Objective    LMP  (LMP Unknown)  BP Readings from Last 3 Encounters:  09/24/21 119/82  08/13/21 (!) 156/85  08/07/21 (!) 163/92   Wt Readings from Last 3 Encounters: Wt Readings from Last 3 Encounters:  09/24/21 272 lb (123.4 kg)  08/23/21 275 lb 3.2 oz (124.8 kg)  08/13/21 274 lb 12.8 oz (124.6 kg)      Physical Exam Constitutional:      General: She is not in acute distress.    Appearance: Normal appearance.  HENT:     Head: Normocephalic and atraumatic.  Pulmonary:     Effort: Pulmonary effort is normal. No respiratory distress.  Neurological:     Mental Status: She is alert.  Psychiatric:        Mood and Affect: Mood normal.        Behavior: Behavior normal.       Assessment & Plan     Problem List Items Addressed This Visit       Cardiovascular and Mediastinum   Hypertension associated with diabetes ([place])    Upcoming CPE - will recheck at that visit and complete labs Refilled meds to get her to that appt      Relevant Medications   atorvastatin (LIPITOR) 20 MG tablet   hydrochlorothiazide (HYDRODIURIL) 25 MG tablet   warfarin (COUMADIN) 5 MG tablet     Genitourinary   Recurrent UTI    Currently on  Cefpodox Upcoming urology f/u [place] pain improving, no fevers      Relevant Medications   cefpodoxime (VANTIN) 200 MG tablet     Other   Chronic anticoagulation (Coumadin) (Chronic)    Recurrent DVTs, 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     I discussed the assessment and treatment plan with the patient. The patient was provided an opportunity to ask questions and all were answered. The patient agreed with the plan and demonstrated an understanding of the instructions.   The patient was advised to call back or seek an in-person evaluation if the symptoms worsen or if the condition fails to improve as anticipated.  I, Lavon Paganini, MD, have reviewed all documentation for this visit. The documentation on 10/01/21 for the exam, diagnosis, procedures, and orders are all accurate and complete.   Elainah Rhyne, Dionne Bucy, MD, MPH [facility]

## 2021-10-01 NOTE — Assessment & Plan Note (Signed)
New problem Not acute, doubt PE as on chronic and therapeutic anticoag Will set her up with Cardiology for further eval and management

## 2021-10-01 NOTE — Assessment & Plan Note (Signed)
Currently on Cefpodox Upcoming urology f/u [place] pain improving, no fevers

## 2021-10-02 ENCOUNTER — Telehealth (INDEPENDENT_AMBULATORY_CARE_PROVIDER_SITE_OTHER): Payer: Self-pay

## 2021-10-02 ENCOUNTER — Encounter (INDEPENDENT_AMBULATORY_CARE_PROVIDER_SITE_OTHER): Payer: Self-pay

## 2021-10-02 MED ORDER — RYBELSUS 3 MG PO TABS: 3.0000 mg | ORAL_TABLET | Freq: Every day | ORAL | 0 refills | Status: DC

## 2021-10-02 NOTE — Progress Notes (Signed)
   10/03/21  CC:  Chief Complaint  Patient presents with   Cysto     HPI: Jane [name] is a 55 y.o.female with a personal history of chronic kidney disease stage III, acute cystitis and OAB, who presents today cystoscopy.   She was initially evaluated for UTI. Urine grew Klebsiella pneumoniae , she was treated with antibiotics.    She was started on Gemtesa 75 mg.  She was recently seen in the ED on 09/26/2021 for right flank pain. Urinalysis showed nitrite positive, moderate blood, and large bacteria. Incidentally, CT scan today shows she had a fatty liver. 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 She also reports that Logan Bores has not been improving her urinary symptoms/    Vitals:   10/03/21 1040  BP: 131/84  Pulse: 74  NED. A&Ox3.   No respiratory distress   Abd soft, NT, ND Normal external genitalia with patent urethral meatus  Cystoscopy Procedure Note  Patient identification was confirmed, informed consent was obtained, and patient was prepped using Betadine solution.  Lidocaine jelly was administered per urethral meatus.    Procedure: - Flexible cystoscope introduced, without any difficulty.   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  Post-Procedure: - Patient tolerated the procedure well  Assessment/ Plan:  Overactive bladder, urgency/frequency  - Cystoscopy unremarkable  - continue Gemtesa 75 mg  UTI  - She is currently on antibiotics.  - Complete course of antibiotic  - Prescribed vaginal estrogen cream, 3 x weekly.  -  Counseled her in UTI prevention supplements  such as cranberry tablets, probiotics, yogurt that has active lactobacillus culture, and d-mannose     Return in 3 months for recheck of symptoms.   I,Kailey Littlejohn,acting as a Education administrator for Hollice Espy, MD.,have documented all relevant documentation on the behalf of Hollice Espy, MD,as directed by  Hollice Espy, MD while in the presence of Hollice Espy, MD.  I have reviewed the above documentation for accuracy and completeness, and I agree with the above.   Hollice Espy, MD

## 2021-10-02 NOTE — Telephone Encounter (Signed)
Cathy from [facility] called to say they are not able to get Ozempic due to a shortage.  Tye [place] stated that the drug rep advised to substitute with Ryblsus (oral med) until they can get the Ozempic.  Please advise for this patient.  Phone [phone] Thank you

## 2021-10-02 NOTE — Telephone Encounter (Signed)
Can you check PA for Rybelsus 3mg  starter? Thanks

## 2021-10-02 NOTE — Telephone Encounter (Signed)
Please review

## 2021-10-02 NOTE — Telephone Encounter (Signed)
Dr Jeani Sow has put in the Rybelsus if you can please check PA. Thanks

## 2021-10-02 NOTE — Telephone Encounter (Signed)
I am unable to do a prior authorization for Rybelsus, there is no prescription on file for patient. Patient can also call other pharmacies to see if they have Ozempic in stock, some of the larger name pharmacies have it in [place].

## 2021-10-02 NOTE — Telephone Encounter (Signed)
Prior authorization for Rybelsus was denied.

## 2021-10-03 ENCOUNTER — Ambulatory Visit (INDEPENDENT_AMBULATORY_CARE_PROVIDER_SITE_OTHER): Payer: Medicaid Other | Admitting: Urology

## 2021-10-03 ENCOUNTER — Encounter: Payer: Self-pay | Admitting: Urology

## 2021-10-03 ENCOUNTER — Ambulatory Visit (INDEPENDENT_AMBULATORY_CARE_PROVIDER_SITE_OTHER): Payer: Medicaid Other

## 2021-10-03 ENCOUNTER — Encounter (INDEPENDENT_AMBULATORY_CARE_PROVIDER_SITE_OTHER): Payer: Self-pay

## 2021-10-03 ENCOUNTER — Other Ambulatory Visit: Payer: Self-pay

## 2021-10-03 VITALS — BP 131/84 | HR 74 | Ht 70.0 in | Wt 272.0 lb

## 2021-10-03 DIAGNOSIS — N3001 Acute cystitis with hematuria: Secondary | ICD-10-CM

## 2021-10-03 DIAGNOSIS — Z86718 Personal history of other venous thrombosis and embolism: Secondary | ICD-10-CM

## 2021-10-03 LAB — MICROSCOPIC EXAMINATION: Epithelial Cells (non renal): NONE SEEN /HPF (ref 0–10)

## 2021-10-03 LAB — POCT INR
INR: 2.2 (ref 2.0–3.0)
PT: 26.3

## 2021-10-03 LAB — URINALYSIS, COMPLETE
Bilirubin, UA: NEGATIVE
Glucose, UA: NEGATIVE
Ketones, UA: NEGATIVE
Nitrite, UA: NEGATIVE
Protein,UA: NEGATIVE
Specific Gravity, UA: 1.015 (ref 1.005–1.030)
Urobilinogen, Ur: 0.2 mg/dL (ref 0.2–1.0)
pH, UA: 6.5 (ref 5.0–7.5)

## 2021-10-03 MED ORDER — ESTRADIOL 0.1 MG/GM VA CREA: TOPICAL_CREAM | VAGINAL | 12 refills | Status: DC

## 2021-10-03 MED ORDER — OZEMPIC (0.25 OR 0.5 MG/DOSE) 2 MG/1.5ML ~~LOC~~ SOPN: 0.2500 mg | PEN_INJECTOR | SUBCUTANEOUS | 0 refills | Status: DC

## 2021-10-03 MED ORDER — ESTRADIOL 0.1 MG/GM VA CREA: 1.0000 | TOPICAL_CREAM | Freq: Every day | VAGINAL | 12 refills | Status: DC

## 2021-10-03 NOTE — Patient Instructions (Signed)
Description   Continue 5mg  daily except 2.5mg  Monday and Wednesday.  Recheck in 2 weeks.

## 2021-10-05 ENCOUNTER — Telehealth: Payer: Self-pay | Admitting: *Deleted

## 2021-10-05 ENCOUNTER — Other Ambulatory Visit: Payer: Self-pay | Admitting: *Deleted

## 2021-10-05 MED ORDER — PREMARIN 0.625 MG/GM VA CREA: TOPICAL_CREAM | VAGINAL | 12 refills | Status: DC

## 2021-10-05 NOTE — Telephone Encounter (Signed)
Per [facility] preferred RX is Premarin vs estradiol cream. Will send a new RX-patient aware will send to [facility] drug as requested for pharmacy

## 2021-10-09 ENCOUNTER — Encounter: Payer: Self-pay | Admitting: Student in an Organized Health Care Education/Training Program

## 2021-10-09 ENCOUNTER — Ambulatory Visit
Payer: Medicaid Other | Attending: Student in an Organized Health Care Education/Training Program | Admitting: Student in an Organized Health Care Education/Training Program

## 2021-10-09 ENCOUNTER — Other Ambulatory Visit: Payer: Self-pay

## 2021-10-09 VITALS — BP 149/66 | HR 96 | Temp 97.2°F | Resp 18 | Ht 70.0 in | Wt 270.0 lb

## 2021-10-09 DIAGNOSIS — Z0289 Encounter for other administrative examinations: Secondary | ICD-10-CM | POA: Diagnosis not present

## 2021-10-09 DIAGNOSIS — I871 Compression of vein: Secondary | ICD-10-CM | POA: Diagnosis not present

## 2021-10-09 DIAGNOSIS — M961 Postlaminectomy syndrome, not elsewhere classified: Secondary | ICD-10-CM | POA: Insufficient documentation

## 2021-10-09 DIAGNOSIS — M5136 Other intervertebral disc degeneration, lumbar region: Secondary | ICD-10-CM | POA: Insufficient documentation

## 2021-10-09 DIAGNOSIS — G894 Chronic pain syndrome: Secondary | ICD-10-CM | POA: Insufficient documentation

## 2021-10-09 DIAGNOSIS — M1712 Unilateral primary osteoarthritis, left knee: Secondary | ICD-10-CM | POA: Diagnosis not present

## 2021-10-09 DIAGNOSIS — G8929 Other chronic pain: Secondary | ICD-10-CM | POA: Insufficient documentation

## 2021-10-09 DIAGNOSIS — M5030 Other cervical disc degeneration, unspecified cervical region: Secondary | ICD-10-CM | POA: Diagnosis not present

## 2021-10-09 DIAGNOSIS — E1142 Type 2 diabetes mellitus with diabetic polyneuropathy: Secondary | ICD-10-CM | POA: Diagnosis not present

## 2021-10-09 DIAGNOSIS — M7918 Myalgia, other site: Secondary | ICD-10-CM | POA: Insufficient documentation

## 2021-10-09 DIAGNOSIS — M792 Neuralgia and neuritis, unspecified: Secondary | ICD-10-CM | POA: Diagnosis not present

## 2021-10-09 MED ORDER — HYDROCODONE-ACETAMINOPHEN 10-325 MG PO TABS: 1.0000 | ORAL_TABLET | Freq: Four times a day (QID) | ORAL | 0 refills | Status: DC | PRN

## 2021-10-09 MED ORDER — HYDROCODONE-ACETAMINOPHEN 10-325 MG PO TABS: 1.0000 | ORAL_TABLET | Freq: Four times a day (QID) | ORAL | 0 refills | Status: AC | PRN

## 2021-10-09 NOTE — Progress Notes (Signed)
Nursing Pain Medication Assessment:  Safety precautions to be maintained throughout the outpatient stay will include: orient to surroundings, keep bed in low position, maintain call bell within reach at all times, provide assistance with transfer out of bed and ambulation.  Medication Inspection Compliance: Pill count conducted under aseptic conditions, in front of the patient. Neither the pills nor the bottle was removed from the patient's sight at any time. Once count was completed pills were immediately returned to the patient in their original bottle.  Medication: Hydrocodone/APAP Pill/Patch Count:  54 of 120 pills remain Pill/Patch Appearance: Markings consistent with prescribed medication Bottle Appearance: Standard pharmacy container. Clearly labeled. Filled Date: 51 / 20 / 2022 Last Medication intake:  Today

## 2021-10-09 NOTE — Progress Notes (Signed)
PROVIDER NOTE: Information contained herein reflects review and annotations entered in association with encounter. Interpretation of such information and data should be left to medically-trained personnel. Information provided to patient can be located elsewhere in the medical record under "Patient Instructions". Document created using STT-dictation technology, any transcriptional errors that may result from process are unintentional.    Patient: Jane [name]  Service Category: E/M  Provider: Gillis Santa, MD  DOB: July 16, 1966  DOS: 10/09/2021  Specialty: Interventional Pain Management  MRN: [id number]  Setting: Ambulatory outpatient  PCP: [name], MD  Type: Established Patient    Referring Provider: [name], MD  Location: Office  Delivery: Face-to-face     HPI  [name], a 55 y.o. year old female, is here today because of her Chronic pain syndrome [G89.4]. [name] primary complain today is Back Pain (low), Leg Pain (bilateral), Knee Pain (bilateral), and Foot Pain Last encounter: My last encounter with her was on 08/09/21 Pertinent problems: [name] has History of DVT (deep vein thrombosis); Bipolar 1 disorder ([name]); Depression, major, recurrent ([place]); Radicular low back pain; May-Thurner syndrome; Iliac vein stenosis, left; Class 2 severe obesity due to excess calories with serious comorbidity and body mass index (BMI) of 38.0 to 38.9 in adult [facility]); Peripheral neuropathy; Chronic low back pain (Bilateral) w/ sciatica (Bilateral); Chronic anticoagulation (Coumadin); CKD stage 3 due to type 2 diabetes mellitus ([place]); Displacement of lumbar intervertebral disc without myelopathy; Chronic pain syndrome; Pharmacologic therapy; Abnormal MRI, cervical spine (01/08/2017); Abnormal MRI, lumbar spine (05/11/2014); DDD (degenerative disc disease), cervical; DDD (degenerative disc disease), lumbar; Failed back surgical syndrome; Diabetic peripheral neuropathy ([place]);  Neurogenic pain; and Chronic musculoskeletal pain on their pertinent problem list. Pain Assessment: Severity of Chronic pain is reported as a 7 /10. Location: Back Lower/radiates down back of left leg to foot. Onset: More than a month ago. Quality: Sharp. Timing: Constant. Modifying factor(s): lying down at times. Vitals:  height is $RemoveB'5\' 10"'TXmsgXyp$  (1.778 m) and weight is 270 lb (122.5 kg). Her temperature is 97.2 F (36.2 C) (abnormal). Her blood pressure is 149/66 (abnormal) and her pulse is 96. Her respiration is 18 and oxygen saturation is 97%.   Reason for encounter: medication management.     Since the patient's last visit with me, she experienced a urinary tract infection and had a cystoscopy done.  There was concern for pyelonephritis.  Antibiotics were escalated.  She completed her antibiotics yesterday. Patient continues multimodal pain regimen as prescribed.  States that it provides pain relief and improvement in functional status.   Pharmacotherapy Assessment    Analgesic: Hydrocodone 10 mg 4 times daily as needed, quantity 120/month; MME equals 40   Monitoring: [place] PMP: PDMP reviewed during this encounter.       Pharmacotherapy: No side-effects or adverse reactions reported. Compliance: No problems identified. Effectiveness: Clinically acceptable.  UDS:  Summary  Date Value Ref Range Status  04/10/2021 Note  Final    Comment:    ==================================================================== [employer] [facility] [employer] (MW) ==================================================================== Test                             Result       Flag       Units  Drug Present and Declared for Prescription Verification   Amphetamine                    [phone]  EXPECTED   ng/mg creat    Amphetamine is available as a schedule II prescription drug.    Hydrocodone                    2716         EXPECTED   ng/mg creat   Hydromorphone                  62           EXPECTED   ng/mg creat    Dihydrocodeine                 311          EXPECTED   ng/mg creat   Norhydrocodone                 1278         EXPECTED   ng/mg creat    Sources of hydrocodone include scheduled prescription medications.    Hydromorphone, dihydrocodeine and norhydrocodone are expected    metabolites of hydrocodone. Hydromorphone and dihydrocodeine are    also available as scheduled prescription medications.  Drug Absent but Declared for Prescription Verification   Alprazolam                     Not Detected UNEXPECTED ng/mg creat ==================================================================== Test                      Result    Flag   Units      Ref Range   Creatinine              82               mg/dL      >=20 ==================================================================== Declared Medications:  The flagging and interpretation on this report are based on the  following declared medications.  Unexpected results may arise from  inaccuracies in the declared medications.   **Note: The testing scope of this panel includes these medications:   Alprazolam (Xanax)  Amphetamine (Adderall)  Hydrocodone (Norco)   **Note: The testing scope of this panel does not include the  following reported medications:   Acetaminophen (Norco)  Aspirin  Atorvastatin (Lipitor)  Cetirizine (Zyrtec)  Eye Drops  Fluoxetine (Prozac)  Hydrochlorothiazide (Hydrodiuril)  Linaclotide (Linzess)  Methocarbamol (Robaxin)  Multivitamin  Omega-3 Fatty Acids  Pantoprazole (Protonix)  Potassium (Klor-Con)  Pregabalin (Lyrica)  Ropinirole (Requip)  Supplement  Warfarin (Coumadin) ==================================================================== For clinical consultation, please call [phone]. ====================================================================       ROS  Constitutional: Denies any fever or chills Gastrointestinal: No reported hemesis, hematochezia, vomiting, or acute GI  distress Musculoskeletal:  Left knee pain  bilateral leg pain, calf pain, right greater than left, bilateral foot pain Neurological: No reported episodes of acute onset apraxia, aphasia, dysarthria, agnosia, amnesia, paralysis, loss of coordination, or loss of consciousness  Medication Review  ALPRAZolam, FLUoxetine, Garlic, HYDROcodone-acetaminophen, Olopatadine HCl, Omega-3 Fatty Acids, Semaglutide(0.25 or 0.5MG /DOS), Vibegron, amLODipine, amitriptyline, amphetamine-dextroamphetamine, aspirin EC, atorvastatin, capsaicin, cefpodoxime, cetirizine, conjugated estrogens, cycloSPORINE, hydrochlorothiazide, levothyroxine, linaclotide, methocarbamol, multivitamin, pantoprazole, potassium chloride, pregabalin, rOPINIRole, and warfarin  History Review  Allergy: [name] is allergic to aripiprazole, seroquel [quetiapine fumarate], chlorpromazine, and gabapentin. Drug: [name]  reports no history of drug use. Alcohol:  reports no history of alcohol use. Tobacco:  reports that she has never smoked. She has never used smokeless tobacco. Social: [name]  reports that she has never smoked.  She has never used smokeless tobacco. She reports that she does not drink alcohol and does not use drugs. Medical:  has a past medical history of ADD (attention deficit disorder), Anxiety, Back pain, Bilateral swelling of feet, Bipolar 1 disorder ([place]), Bipolar 1 disorder ([place]), Bipolar disorder ([place]), Chewing difficulty, Chronic fatigue syndrome, Chronic kidney disease, Constipation, Depression, Diabetes mellitus without complication ([place]), Dyspnea, GERD (gastroesophageal reflux disease), Headache, High cholesterol, History of blood clots, History of DVT (deep vein thrombosis), Hypertension, Hypothyroidism, Joint pain, Neuropathy, Obsessive-compulsive disorder, Peripheral vascular disease ([place]), Prediabetes, Respiratory failure requiring intubation ([place]), Restless leg syndrome, Shortness of breath, Sleep apnea,  Thrombocytopenia ([place]) (09/15/2019), Trigger thumb of left hand (01/2018), and Trigger thumb of right hand. Surgical: [name]  has a past surgical history that includes Cholecystectomy; Trigger finger release (Right, 12/01/2017); Abdominal hysterectomy (06/2016); Lumbar fusion (11/21/2000); Lumbar spine surgery; Trigger finger release (Left, 01/26/2018); LOWER EXTREMITY VENOGRAPHY (N/A, 08/17/2018); Femoral-femoral Bypass Graft (Left, 11/24/2018); AV fistula placement (Left, 11/24/2018); LOWER EXTREMITY VENOGRAPHY (Bilateral, 03/09/2019); Patch angioplasty (Left, 03/30/2019); Ultrasound guidance for vascular access (Right, 03/30/2019); Femoral artery debridement (03/30/2019); Insertion of iliac stent (03/30/2019); Groin debridement (Left, 04/27/2019); Application if wound vac (Left, 04/27/2019); LOWER EXTREMITY VENOGRAPHY (Left, 08/16/2019); PERIPHERAL VASCULAR INTERVENTION (Left, 08/16/2019); Back surgery; Colonoscopy with propofol (N/A, 11/22/2019); and Knee arthroscopy with meniscal repair (Left, 11/14/2020). Family: family history includes ADD / ADHD in her daughter; Anxiety disorder in her daughter; Bipolar disorder in her daughter, maternal aunt, and mother; Depression in her mother; Diabetes in her father; Drug abuse in her daughter and daughter; Heart disease in her brother, father, and mother; Hyperlipidemia in her brother, father, and mother; Hypertension in her brother, father, mother, and sister; Obesity in her father and mother; Sleep apnea in her father and mother; Stroke in her mother; Suicidality in her maternal aunt.  Laboratory Chemistry Profile   Renal Lab Results  Component Value Date   BUN 33 (H) 04/12/2021   CREATININE 1.23 (H) 04/12/2021   BCR 21 03/22/2021   GFRAA 53 (L) 07/18/2020   GFRNONAA 52 (L) 04/12/2021     Hepatic Lab Results  Component Value Date   AST 17 04/12/2021   ALT 20 04/12/2021   ALBUMIN 3.8 04/12/2021   ALKPHOS 95 04/12/2021     Electrolytes Lab Results   Component Value Date   NA 140 04/12/2021   K 3.8 04/12/2021   CL 101 04/12/2021   CALCIUM 9.5 04/12/2021   MG 2.2 04/04/2019   PHOS 4.0 04/04/2019     Bone Lab Results  Component Value Date   VD25OH 50.60 01/01/2021     Inflammation (CRP: Acute Phase) (ESR: Chronic Phase) Lab Results  Component Value Date   CRP 11 (H) 02/16/2020   ESRSEDRATE 33 02/16/2020   LATICACIDVEN 1.2 01/05/2020       Note: Above Lab results reviewed.  Recent Imaging Review  DG Knee AP/LAT W/[place] Left DG Knee AP/LAT W/[place] Left  Bilateral knee pain status post left knee medial meniscal tear  X-rays show narrowing of the medial compartment of the left knee symmetric  spaces on the right knee no secondary bone changes  Impression grade 1/2 arthritis left knee Note: Reviewed        Physical Exam  General appearance: Well nourished, well developed, and well hydrated. In no apparent acute distress Mental status: Alert, oriented x 3 (person, place, & time)       Respiratory: No evidence of acute respiratory distress Eyes: PERLA Vitals: BP (!) 149/66 (BP  Location: Right Arm, Patient Position: Sitting, Cuff Size: Large)   Pulse 96   Temp (!) 97.2 F (36.2 C)   Resp 18   Ht $R'5\' 10"'mv$  (1.778 m)   Wt 270 lb (122.5 kg)   LMP  (LMP Unknown)   SpO2 97%   BMI 38.74 kg/m  BMI: Estimated body mass index is 38.74 kg/m as calculated from the following:   Height as of this encounter: $RemoveBeforeD'5\' 10"'FfBPLTCtoRdtpB$  (1.778 m).   Weight as of this encounter: 270 lb (122.5 kg). Ideal: Ideal body weight: 68.5 kg (151 lb 0.2 oz) Adjusted ideal body weight: 90.1 kg (198 lb 9.7 oz)  Lumbar Spine Area Exam  Skin & Axial Inspection: Well healed scar from previous spine surgery detected Alignment: Asymmetric Functional ROM: Decreased ROM       Stability: No instability detected Muscle Tone/Strength: Functionally intact. No obvious neuro-muscular anomalies detected. Sensory (Neurological): Dermatomal pain pattern   Provocative  Tests: Hyperextension/rotation test: (+) bilaterally for facet joint pain. Lumbar quadrant test (Kemp's test): (+) bilaterally for facet joint pain.     Gait & Posture Assessment  Ambulation: Limited Gait: Antalgic gait (limping) Posture: Difficulty standing up straight, due to pain  Lower Extremity Exam      Side: Right lower extremity   Side: Left lower extremity  Stability: No instability observed           Stability: No instability observed          Skin & Extremity Inspection: Skin color, temperature, and hair growth are WNL. No peripheral edema or cyanosis. No masses, redness, swelling, asymmetry, or associated skin lesions. No contractures.   Skin & Extremity Inspection: Skin color, temperature, and hair growth are WNL. No peripheral edema or cyanosis. No masses, redness, swelling, asymmetry, or associated skin lesions. No contractures.  Functional ROM: Decreased ROM for hip and knee joints           Functional ROM: Decreased ROM for hip and knee joints, pain with weight bearing and lateral rotation          Muscle Tone/Strength: Functionally intact. No obvious neuro-muscular anomalies detected.   Muscle Tone/Strength: Functionally intact. No obvious neuro-muscular anomalies detected.  Sensory (Neurological): Neurogenic pain pattern        Sensory (Neurological):  Arthropathic arthralgia for left knee severe pain with weightbearing        DTR: Patellar: deferred today Achilles: deferred today Plantar: deferred today   DTR: Patellar: deferred today Achilles: deferred today Plantar: deferred today  Palpation: No palpable anomalies   Palpation: No palpable anomalies    Assessment   Status Diagnosis  Controlled Controlled Controlled 1. Chronic pain syndrome   2. Failed back surgical syndrome   3. Neurogenic pain   4. DDD (degenerative disc disease), lumbar   5. Pain management contract signed   6. Arthritis of left knee   7. May-Thurner syndrome   8. DDD (degenerative disc  disease), cervical   9. Diabetic peripheral neuropathy ([place])   10. Chronic musculoskeletal pain        Plan of Care  [name] has a current medication list which includes the following long-term medication(s): amitriptyline, amlodipine, amphetamine-dextroamphetamine, atorvastatin, cetirizine, fluoxetine, fluoxetine, hydrochlorothiazide, levothyroxine, linzess, pantoprazole, potassium chloride, ropinirole, and warfarin.  Pharmacotherapy (Medications Ordered): Meds ordered this encounter  Medications   HYDROcodone-acetaminophen (NORCO) 10-325 MG tablet    Sig: Take 1 tablet by mouth every 6 (six) hours as needed. To last 30 days from fill date  Dispense:  120 tablet    Refill:  0   HYDROcodone-acetaminophen (NORCO) 10-325 MG tablet    Sig: Take 1 tablet by mouth every 6 (six) hours as needed. To last 30 days from fill date    Dispense:  120 tablet    Refill:  0   HYDROcodone-acetaminophen (NORCO) 10-325 MG tablet    Sig: Take 1 tablet by mouth every 6 (six) hours as needed. To last 30 days from fill date    Dispense:  120 tablet    Refill:  0   Qutenza, capsaicin 8% topical treatment for painful diabetic neuropathy of bilateral feet denied by Medicaid  No orders of the defined types were placed in this encounter.    Follow-up plan:   Return in about 3 months (around 01/17/2022) for Medication Management, in person.   Recent Visits Date Type Provider Dept  08/09/21 Office Visit Gillis Santa, MD [facility]  07/17/21 Office Visit Gillis Santa, MD [facility]  Showing recent visits within past 90 days and meeting all other requirements Today's Visits Date Type Provider Dept  10/09/21 Office Visit Gillis Santa, MD [facility]  Showing today's visits and meeting all other requirements Future Appointments No visits were found meeting these conditions. Showing future appointments within next 90 days and meeting all other requirements I  discussed the assessment and treatment plan with the patient. The patient was provided an opportunity to ask questions and all were answered. The patient agreed with the plan and demonstrated an understanding of the instructions.  Patient advised to call back or seek an in-person evaluation if the symptoms or condition worsens.  Duration of encounter: 9minutes.  Note by: Gillis Santa, MD Date: 10/09/2021; Time: 1:59 PM

## 2021-10-11 ENCOUNTER — Telehealth (INDEPENDENT_AMBULATORY_CARE_PROVIDER_SITE_OTHER): Payer: Medicaid Other | Admitting: Psychiatry

## 2021-10-11 ENCOUNTER — Other Ambulatory Visit: Payer: Self-pay

## 2021-10-11 ENCOUNTER — Encounter (HOSPITAL_COMMUNITY): Payer: Self-pay | Admitting: Psychiatry

## 2021-10-11 DIAGNOSIS — R32 Unspecified urinary incontinence: Secondary | ICD-10-CM | POA: Diagnosis not present

## 2021-10-11 DIAGNOSIS — F900 Attention-deficit hyperactivity disorder, predominantly inattentive type: Secondary | ICD-10-CM | POA: Diagnosis not present

## 2021-10-11 DIAGNOSIS — F3130 Bipolar disorder, current episode depressed, mild or moderate severity, unspecified: Secondary | ICD-10-CM

## 2021-10-11 MED ORDER — AMPHETAMINE-DEXTROAMPHETAMINE 30 MG PO TABS: 30.0000 mg | ORAL_TABLET | Freq: Two times a day (BID) | ORAL | 0 refills | Status: DC

## 2021-10-11 MED ORDER — ALPRAZOLAM 1 MG PO TABS
1.0000 mg | ORAL_TABLET | Freq: Two times a day (BID) | ORAL | 2 refills | Status: DC | PRN
Start: 2021-10-11 — End: 2022-01-01

## 2021-10-11 MED ORDER — FLUOXETINE HCL 20 MG PO CAPS: 20.0000 mg | ORAL_CAPSULE | Freq: Every day | ORAL | 2 refills | Status: DC

## 2021-10-11 MED ORDER — FLUOXETINE HCL 40 MG PO CAPS
ORAL_CAPSULE | ORAL | 2 refills | Status: DC
Start: 2021-10-11 — End: 2022-01-11
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
## 2021-10-15 ENCOUNTER — Encounter (HOSPITAL_COMMUNITY): Payer: Self-pay | Admitting: Hematology

## 2021-10-16 DIAGNOSIS — D179 Benign lipomatous neoplasm, unspecified: Secondary | ICD-10-CM | POA: Diagnosis not present

## 2021-10-16 DIAGNOSIS — L538 Other specified erythematous conditions: Secondary | ICD-10-CM | POA: Diagnosis not present

## 2021-10-16 DIAGNOSIS — D485 Neoplasm of uncertain behavior of skin: Secondary | ICD-10-CM | POA: Diagnosis not present

## 2021-10-16 DIAGNOSIS — B079 Viral wart, unspecified: Secondary | ICD-10-CM | POA: Diagnosis not present

## 2021-10-16 DIAGNOSIS — L814 Other melanin hyperpigmentation: Secondary | ICD-10-CM | POA: Diagnosis not present

## 2021-10-16 DIAGNOSIS — L218 Other seborrheic dermatitis: Secondary | ICD-10-CM | POA: Diagnosis not present

## 2021-10-16 DIAGNOSIS — D225 Melanocytic nevi of trunk: Secondary | ICD-10-CM | POA: Diagnosis not present

## 2021-10-16 DIAGNOSIS — Z872 Personal history of diseases of the skin and subcutaneous tissue: Secondary | ICD-10-CM | POA: Diagnosis not present

## 2021-10-16 DIAGNOSIS — L821 Other seborrheic keratosis: Secondary | ICD-10-CM | POA: Diagnosis not present

## 2021-10-16 DIAGNOSIS — I788 Other diseases of capillaries: Secondary | ICD-10-CM | POA: Diagnosis not present

## 2021-10-16 DIAGNOSIS — L820 Inflamed seborrheic keratosis: Secondary | ICD-10-CM | POA: Diagnosis not present

## 2021-10-17 ENCOUNTER — Other Ambulatory Visit: Payer: Self-pay

## 2021-10-17 ENCOUNTER — Ambulatory Visit (INDEPENDENT_AMBULATORY_CARE_PROVIDER_SITE_OTHER): Payer: Medicaid Other

## 2021-10-17 DIAGNOSIS — Z86718 Personal history of other venous thrombosis and embolism: Secondary | ICD-10-CM

## 2021-10-17 LAB — POCT INR
INR: 2.5 (ref 2.0–3.0)
PT: 29.8

## 2021-10-17 NOTE — Patient Instructions (Signed)
Description   Continue 5mg  daily except 2.5mg  Monday and Wednesday.  Recheck in 4 weeks.

## 2021-10-18 ENCOUNTER — Other Ambulatory Visit (HOSPITAL_COMMUNITY): Payer: Self-pay

## 2021-10-18 ENCOUNTER — Encounter (INDEPENDENT_AMBULATORY_CARE_PROVIDER_SITE_OTHER): Payer: Self-pay | Admitting: Family Medicine

## 2021-10-18 ENCOUNTER — Ambulatory Visit (INDEPENDENT_AMBULATORY_CARE_PROVIDER_SITE_OTHER): Payer: Medicaid Other | Admitting: Family Medicine

## 2021-10-18 ENCOUNTER — Encounter (HOSPITAL_COMMUNITY): Payer: Self-pay | Admitting: Hematology

## 2021-10-18 VITALS — BP 144/79 | HR 68 | Temp 97.9°F | Ht 70.0 in | Wt 276.0 lb

## 2021-10-18 DIAGNOSIS — E1169 Type 2 diabetes mellitus with other specified complication: Secondary | ICD-10-CM | POA: Diagnosis not present

## 2021-10-18 DIAGNOSIS — Z6841 Body Mass Index (BMI) 40.0 and over, adult: Secondary | ICD-10-CM | POA: Diagnosis not present

## 2021-10-18 DIAGNOSIS — E785 Hyperlipidemia, unspecified: Secondary | ICD-10-CM | POA: Diagnosis not present

## 2021-10-18 DIAGNOSIS — E1165 Type 2 diabetes mellitus with hyperglycemia: Secondary | ICD-10-CM | POA: Diagnosis not present

## 2021-10-18 MED ORDER — OZEMPIC (0.25 OR 0.5 MG/DOSE) 2 MG/1.5ML ~~LOC~~ SOPN
0.2500 mg | PEN_INJECTOR | SUBCUTANEOUS | 0 refills | Status: DC
  Filled 2021-10-18: qty 1.5, 56d supply, fill #0

## 2021-10-18 NOTE — Progress Notes (Signed)
Chief Complaint:   OBESITY Jane [name] is here to discuss her progress with her obesity treatment plan along with follow-up of her obesity related diagnoses. Jane [name] is on practicing portion control and making smarter food choices, such as increasing vegetables and decreasing simple carbohydrates and states she is following her eating plan approximately 50% of the time. Jane [name] states she is not currently exercising.  Today's visit was #: 22 Starting weight: 283 lbs Starting date: 03/29/2020 Today's weight: 276 lbs Today's date: 10/18/2021 Total lbs lost to date: 7 Total lbs lost since last in-office visit: 0  Interim History: Pt is on a wait list in [place] for an apartment. She is still not talking to family. Pt is not expecting much indulgent eating in the next few weeks. She wants to pull herself back on category 4 and she wants to start journaling food, as well.  Subjective:   1. Type 2 diabetes mellitus with hyperglycemia, without long-term current use of insulin (HCC) Jane [name] could not start Ozempic due to drug shortage. She is trying to limit carb intake.  2. Hyperlipidemia associated with type 2 diabetes mellitus ([place]) Pt is on statin therapy. She hasn't had lipids checked in over a year.  Assessment/Plan:   1. Type 2 diabetes mellitus with hyperglycemia, without long-term current use of insulin (HCC) Good blood sugar control is important to decrease the likelihood of diabetic complications such as nephropathy, neuropathy, limb loss, blindness, coronary artery disease, and death. Intensive lifestyle modification including diet, exercise and weight loss are the first line of treatment for diabetes. Jane [name] will start Ozempic 0.25 mg as prescribed. Risks/benefits and side effects discussed with pt.  Start- Semaglutide,0.25 or 0.5MG /DOS, (OZEMPIC, 0.25 OR 0.5 MG/DOSE,) 2 MG/1.5ML SOPN; Inject 0.25 mg into the skin once a week.  Dispense: 1.5 mL; Refill: 0  2. Hyperlipidemia associated  with type 2 diabetes mellitus ([place]) Cardiovascular risk and specific lipid/LDL goals reviewed.  We discussed several lifestyle modifications today and Jane [name] will continue to work on diet, exercise and weight loss efforts. Orders and follow up as documented in patient record. Follow up with PCP for labs in 4 days.  Counseling Intensive lifestyle modifications are the first line treatment for this issue. Dietary changes: Increase soluble fiber. Decrease simple carbohydrates. Exercise changes: Moderate to vigorous-intensity aerobic activity 150 minutes per week if tolerated. Lipid-lowering medications: see documented in medical record.  3. Obesity with current BMI of 39.6  Jane [name] is currently in the action stage of change. As such, her goal is to continue with weight loss efforts. She has agreed to the Category 4 Plan.   Exercise goals: No exercise has been prescribed at this time.  Behavioral modification strategies: increasing lean protein intake, meal planning and cooking strategies, keeping healthy foods in the home, holiday eating strategies , and planning for success.  Jane [name] has agreed to follow-up with our clinic in 4 weeks. She was informed of the importance of frequent follow-up visits to maximize her success with intensive lifestyle modifications for her multiple health conditions.   Objective:   Blood pressure (!) 144/79, pulse 68, temperature 97.9 F (36.6 C), height 5\' 10"  (1.778 m), weight 276 lb (125.2 kg), SpO2 99 %. Body mass index is 39.6 kg/m.  General: Cooperative, alert, well developed, in no acute distress. HEENT: Conjunctivae and lids unremarkable. Cardiovascular: Regular rhythm.  Lungs: Normal work of breathing. Neurologic: No focal deficits.   Lab Results  Component Value Date   CREATININE 1.23 (H) 04/12/2021  BUN 33 (H) 04/12/2021   NA 140 04/12/2021   K 3.8 04/12/2021   CL 101 04/12/2021   CO2 32 04/12/2021   Lab Results  Component Value Date   ALT  20 04/12/2021   AST 17 04/12/2021   ALKPHOS 95 04/12/2021   BILITOT 0.6 04/12/2021   Lab Results  Component Value Date   HGBA1C 6.3 (H) 03/22/2021   HGBA1C 5.8 (A) 10/24/2020   HGBA1C 6.2 (H) 03/29/2020   HGBA1C 6.3 (H) 10/25/2019   HGBA1C 6.3 (H) 03/26/2019   Lab Results  Component Value Date   INSULIN 21.5 03/29/2020   Lab Results  Component Value Date   TSH 0.107 (L) 05/18/2020   Lab Results  Component Value Date   CHOL 203 (H) 03/29/2020   HDL 37 (L) 03/29/2020   LDLCALC 114 (H) 03/29/2020   TRIG 296 (H) 03/29/2020   CHOLHDL 5.0 (H) 10/25/2019   Lab Results  Component Value Date   VD25OH 50.60 01/01/2021   VD25OH 45.30 10/17/2020   VD25OH 37.20 07/18/2020   Lab Results  Component Value Date   WBC 9.3 08/16/2021   HGB 15.2 (H) 08/16/2021   HCT 45.3 08/16/2021   MCV 93.8 08/16/2021   PLT 156 08/16/2021   Lab Results  Component Value Date   IRON 93 08/16/2021   TIBC 348 08/16/2021   FERRITIN 137 08/16/2021    Attestation Statements:   Reviewed by clinician on day of visit: allergies, medications, problem list, medical history, surgical history, family history, social history, and previous encounter notes.  Coral Ceo, CMA, am acting as transcriptionist for Coralie Common, MD.   I have reviewed the above documentation for accuracy and completeness, and I agree with the above. - Coralie Common, MD

## 2021-10-19 DIAGNOSIS — F3132 Bipolar disorder, current episode depressed, moderate: Secondary | ICD-10-CM | POA: Diagnosis not present

## 2021-10-19 DIAGNOSIS — H04123 Dry eye syndrome of bilateral lacrimal glands: Secondary | ICD-10-CM | POA: Diagnosis not present

## 2021-10-19 NOTE — Progress Notes (Signed)
Complete physical exam   Patient: Jane [name]   DOB: 10-15-1966   55 y.o. Female  MRN: [id number] Visit Date: 10/22/2021  Today's healthcare provider: Lavon Paganini, MD   Chief Complaint  Patient presents with   Annual Exam   Subjective    Jane [name] is a 55 y.o. female who presents today for a complete physical exam.  She reports consuming a  high protein  diet. The patient does not participate in regular exercise at present. She generally feels fairly well. She reports sleeping some days she sleep too much and some days she can't. She does have additional problems to discuss today.  HPI   Was seeing Dr Aline Brochure in [place] and had L knee arthroscopy earlier this year.  "X-ray of the right knee was done on July 2022 she has fairly good knee preservation on that knee today we did the left knee and she has grade 2 arthritis with narrowing of the medial compartment but no secondary bone changes"  Upcoming cardiology appt for DOE  Is seeing Endocrinology for hypothyroidism but not T2DM.  Past Medical History:  Diagnosis Date   ADD (attention deficit disorder)    Anxiety    Back pain    Bilateral swelling of feet    Bipolar 1 disorder (HCC)    Bipolar 1 disorder (HCC)    Bipolar disorder (HCC)    Chewing difficulty    Chronic fatigue syndrome    Chronic kidney disease    Stage 3 kidney disease;dx by Dr. Sinda Du.    Constipation    Depression    Diabetes mellitus without complication (HCC)    diet controlled   Dyspnea    with exertion   GERD (gastroesophageal reflux disease)    Headache    migraines   High cholesterol    History of blood clots    History of DVT (deep vein thrombosis)    left leg   Hypertension    states under control with meds., has been on med. x 2 years   Hypothyroidism    Joint pain    Neuropathy    Obsessive-compulsive disorder    Peripheral vascular disease ([place])    Prediabetes    Respiratory failure requiring  intubation ([place])    Restless leg syndrome    Shortness of breath    Sleep apnea    Thrombocytopenia ([place]) 09/15/2019   Trigger thumb of left hand 01/2018   Trigger thumb of right hand    Past Surgical History:  Procedure Laterality Date   ABDOMINAL HYSTERECTOMY  06/2016   complete   APPLICATION OF WOUND VAC Left 04/27/2019   Procedure: APPLICATION OF WOUND VAC LEFT GROIN;  Surgeon: Waynetta Sandy, MD;  Location: [facility];  Service: Vascular;  Laterality: Left;   AV FISTULA PLACEMENT Left 11/24/2018   Procedure: ARTERIOVENOUS (AV) FISTULA CREATION LEFT SFA TO LEFT FEMORAL VEIN;  Surgeon: Waynetta Sandy, MD;  Location: [facility];  Service: Vascular;  Laterality: Left;   BACK SURGERY     CHOLECYSTECTOMY     COLONOSCOPY WITH PROPOFOL N/A 11/22/2019   Procedure: COLONOSCOPY WITH PROPOFOL;  Surgeon: [name], MD;  Location: [facility] ENDOSCOPY;  Service: Gastroenterology;  Laterality: N/A;   FEMORAL ARTERY EXPLORATION  03/30/2019   Procedure: Left Common Femoral Artery and Vein Exploration;  Surgeon: Waynetta Sandy, MD;  Location: [facility];  Service: Vascular;;   FEMORAL-FEMORAL BYPASS GRAFT Left 11/24/2018   Procedure: BYPASS GRAFT FEMORAL-FEMORAL VENOUS LEFT TO  RIGHT PALMA PROCEDURE USING CRYOVEIN;  Surgeon: Waynetta Sandy, MD;  Location: [facility];  Service: Vascular;  Laterality: Left;   GROIN DEBRIDEMENT Left 04/27/2019   Procedure: GROIN DEBRIDEMENT;  Surgeon: Waynetta Sandy, MD;  Location: [facility];  Service: Vascular;  Laterality: Left;   INSERTION OF ILIAC STENT  03/30/2019   Procedure: Stent of left common, external iliac veins and left common femoral vein;  Surgeon: Waynetta Sandy, MD;  Location: [facility];  Service: Vascular;;   KNEE ARTHROSCOPY WITH MENISCAL REPAIR Left 11/14/2020   Procedure: LEFT KNEE ARTHROSCOPY WITH PARTIAL MEDIAL MENISCECTOMY;  Surgeon: Carole Civil, MD;  Location: [facility];  Service: Orthopedics;  Laterality: Left;    LOWER EXTREMITY VENOGRAPHY N/A 08/17/2018   Procedure: LOWER EXTREMITY VENOGRAPHY - Central Venogram;  Surgeon: Waynetta Sandy, MD;  Location: [facility];  Service: Cardiovascular;  Laterality: N/A;   LOWER EXTREMITY VENOGRAPHY Bilateral 03/09/2019   Procedure: LOWER EXTREMITY VENOGRAPHY;  Surgeon: Waynetta Sandy, MD;  Location: [facility];  Service: Cardiovascular;  Laterality: Bilateral;   LOWER EXTREMITY VENOGRAPHY Left 08/16/2019   Procedure: LOWER EXTREMITY VENOGRAPHY;  Surgeon: Waynetta Sandy, MD;  Location: [facility];  Service: Cardiovascular;  Laterality: Left;   LUMBAR FUSION  11/21/2000   L5-S1   LUMBAR SPINE SURGERY     x 2 others   PATCH ANGIOPLASTY Left 03/30/2019   Procedure: Patch Angioplasty of the Left Common Femoral Vein using Venosure Biologic patch;  Surgeon: Waynetta Sandy, MD;  Location: [facility];  Service: Vascular;  Laterality: Left;   PERIPHERAL VASCULAR INTERVENTION Left 08/16/2019   Procedure: PERIPHERAL VASCULAR INTERVENTION;  Surgeon: Waynetta Sandy, MD;  Location: [facility];  Service: Cardiovascular;  Laterality: Left;  common femoral/femoral vein stent   TRIGGER FINGER RELEASE Right 12/01/2017   Procedure: RELEASE TRIGGER FINGER/A-1 PULLEY RIGHT THUMB;  Surgeon: Leanora Cover, MD;  Location: [facility];  Service: Orthopedics;  Laterality: Right;   TRIGGER FINGER RELEASE Left 01/26/2018   Procedure: LEFT TRIGGER THUMB RELEASE;  Surgeon: Leanora Cover, MD;  Location: [facility];  Service: Orthopedics;  Laterality: Left;   ULTRASOUND GUIDANCE FOR VASCULAR ACCESS Right 03/30/2019   Procedure: Ultrasound-guided cannulation right internal jugular vein;  Surgeon: Waynetta Sandy, MD;  Location[facility] OR;  Service: Vascular;  Laterality: Right;   Social History   Socioeconomic History   Marital status: Divorced    Spouse name: Not on file   Number of  children: 3   Years of education: Not on file   Highest education level: Not on file  Occupational History   Not on file  Tobacco Use   Smoking status: Never   Smokeless tobacco: Never  Vaping Use   Vaping Use: Never used  Substance and Sexual Activity   Alcohol use: No   Drug use: No   Sexual activity: Not Currently    Partners: Male    Birth control/protection: Surgical  Other Topics Concern   Not on file  Social History Narrative   Not on file   Social Determinants of Health   Financial Resource Strain: High Risk   Difficulty of Paying Living Expenses: Hard  Food Insecurity: Food Insecurity Present   Worried About Running Out of Food in the Last Year: Sometimes true   Ran Out of Food in the Last Year: Never true  Transportation Needs: No Transportation Needs   Lack of Transportation (Medical): No  Lack of Transportation (Non-Medical): No  Physical Activity: Inactive   Days of Exercise per Week: 0 days   Minutes of Exercise per Session: 0 min  Stress: Stress Concern Present   Feeling of Stress : Very much  Social Connections: Socially Isolated   Frequency of Communication with Friends and Family: More than three times a week   Frequency of Social Gatherings with Friends and Family: Three times a week   Attends Religious Services: Never   Active Member of Clubs or Organizations: No   Attends Music therapist: Never   Marital Status: Divorced  Human resources officer Violence: Not At Risk   Fear of Current or Ex-Partner: No   Emotionally Abused: No   Physically Abused: No   Sexually Abused: No   Family Status  Relation Name Status   Mother  Deceased   Father  Deceased   Daughter Amy Ernesto Rutherford   Daughter Eran Alive   Daughter Cabin crew Alive   MGF  Deceased   MGM  Deceased   PGF  Deceased   PGM  Deceased   Sister  (Not Specified)   Brother  (Not Specified)   Programmer, systems  (Not Specified)   Family History  Problem Relation Age of Onset   Heart disease  Mother    Hyperlipidemia Mother    Hypertension Mother    Bipolar disorder Mother    Stroke Mother    Depression Mother    Sleep apnea Mother    Obesity Mother    Diabetes Father    Heart disease Father    Hyperlipidemia Father    Hypertension Father    Sleep apnea Father    Obesity Father    Drug abuse Daughter    ADD / ADHD Daughter    Drug abuse Daughter    Anxiety disorder Daughter    Bipolar disorder Daughter    Hypertension Sister    Hypertension Brother    Hyperlipidemia Brother    Heart disease Brother    Bipolar disorder Maternal Aunt    Suicidality Maternal Aunt    Allergies  Allergen Reactions   Aripiprazole Other (See Comments)    BECOMES  VIOLENT    Seroquel [Quetiapine Fumarate] Other (See Comments)    BECOMES VIOLENT   Chlorpromazine Other (See Comments)    SEVERE ANXIETY    Gabapentin Other (See Comments)    NIGHTMARES     Patient Care Team: [name], MD as PCP - General (Family Medicine)   Medications: Outpatient Medications Prior to Visit  Medication Sig   ALPRAZolam (XANAX) 1 MG tablet Take 1 tablet (1 mg total) by mouth 2 (two) times daily as needed for anxiety.   amitriptyline (ELAVIL) 25 MG tablet Take 25 mg by mouth at bedtime.   amLODipine (NORVASC) 5 MG tablet Take 1 tablet (5 mg total) by mouth daily.   amphetamine-dextroamphetamine (ADDERALL) 30 MG tablet Take 1 tablet by mouth 2 (two) times daily.   amphetamine-dextroamphetamine (ADDERALL) 30 MG tablet Take 1 tablet by mouth 2 (two) times daily.   amphetamine-dextroamphetamine (ADDERALL) 30 MG tablet Take 1 tablet by mouth 2 (two) times daily.   aspirin EC 81 MG tablet Take 81 mg by mouth daily.   atorvastatin (LIPITOR) 20 MG tablet Take 1 tablet (20 mg total) by mouth daily. Please schedule office visit before any future refills.   capsaicin (ZOSTRIX) 0.025 % cream Apply topically 2 (two) times daily.   cefpodoxime (VANTIN) 200 MG tablet Take 200 mg by  mouth 2 (two) times  daily.   cetirizine (ZYRTEC) 10 MG tablet TAKE 1 TABLET BY MOUTH DAILY   conjugated estrogens (PREMARIN) vaginal cream Estrogen Cream Instruction  Discard applicator  Apply pea sized amount to tip of finger to urethra before bed. Wash hands well after application. Use Monday, Wednesday and Friday   cycloSPORINE (RESTASIS) 0.05 % ophthalmic emulsion Place 1 drop into both eyes 2 (two) times daily.   FLUoxetine (PROZAC) 20 MG capsule Take 1 capsule (20 mg total) by mouth daily.   FLUoxetine (PROZAC) 40 MG capsule TAKE 1 TABLET BY MOUTH DAILY   GARLIC PO Take 0,076 mg by mouth daily.   hydrochlorothiazide (HYDRODIURIL) 25 MG tablet Take 1 tablet (25 mg total) by mouth daily. Please schedule office visit before any future refills.   [START ON 10/23/2021] HYDROcodone-acetaminophen (NORCO) 10-325 MG tablet Take 1 tablet by mouth every 6 (six) hours as needed. To last 30 days from fill date   [START ON 11/22/2021] HYDROcodone-acetaminophen (NORCO) 10-325 MG tablet Take 1 tablet by mouth every 6 (six) hours as needed. To last 30 days from fill date   [START ON 12/22/2021] HYDROcodone-acetaminophen (NORCO) 10-325 MG tablet Take 1 tablet by mouth every 6 (six) hours as needed. To last 30 days from fill date   levothyroxine (SYNTHROID) 125 MCG tablet Take 125 mcg by mouth daily before breakfast.   LINZESS 145 MCG CAPS capsule TAKE ONE CAPSULE BY MOUTH DAILY (Patient taking differently: Take 145 mcg by mouth daily as needed.)   methocarbamol (ROBAXIN) 750 MG tablet Take 1 tablet (750 mg total) by mouth 2 (two) times daily as needed for muscle spasms.   Multiple Vitamin (MULTIVITAMIN) capsule Take 1 capsule by mouth daily.   Olopatadine HCl 0.2 % SOLN Place 1 drop into both eyes daily.   Omega-3 Fatty Acids (OMEGA-3 FISH OIL PO) Take 600 mg by mouth daily.   pantoprazole (PROTONIX) 40 MG tablet TAKE 1 TABLET BY MOUTH DAILY (Patient taking differently: Take 40 mg by mouth daily.)   potassium chloride  (KLOR-CON) 10 MEQ tablet Take 1 tablet (10 mEq total) by mouth 3 (three) times daily. Please schedule office visit before any future refills.   pregabalin (LYRICA) 100 MG capsule Take 100 mg by mouth 4 (four) times daily.   rOPINIRole (REQUIP) 2 MG tablet TAKE 1 TABLET BY MOUTH TWICE DAILY (Patient taking differently: Take 2 mg by mouth 2 (two) times daily.)   Semaglutide,0.25 or 0.5MG /DOS, (OZEMPIC, 0.25 OR 0.5 MG/DOSE,) 2 MG/1.5ML SOPN Inject 0.25 mg into the skin once a week.   Vibegron (GEMTESA) 75 MG TABS Take 75 mg by mouth daily.   warfarin (COUMADIN) 5 MG tablet 1/2 tablet on Monday and Wednesday 1 tablet all other days   No facility-administered medications prior to visit.    Review of Systems  Constitutional: Negative.   HENT:  Positive for dental problem, ear pain, mouth sores, sore throat and voice change.   Eyes: Negative.   Respiratory:  Positive for apnea and shortness of breath.   Cardiovascular:  Positive for leg swelling.  Gastrointestinal:  Positive for abdominal distention, abdominal pain and constipation.  Endocrine: Negative.   Genitourinary:  Positive for difficulty urinating.  Musculoskeletal:  Positive for arthralgias, back pain, gait problem, joint swelling, myalgias, neck pain and neck stiffness.  Skin: Negative.   Allergic/Immunologic: Negative.   Neurological:  Positive for dizziness, light-headedness and headaches.  Hematological: Negative.   Psychiatric/Behavioral: Negative.       Objective  BP 131/81 (BP Location: Right Arm, Patient Position: Sitting, Cuff Size: Large)    Pulse 69    Resp 16    Ht 5\' 10"  (1.778 m)    Wt 284 lb 9.6 oz (129.1 kg)    LMP  (LMP Unknown)    BMI 40.84 kg/m    Physical Exam Vitals reviewed.  Constitutional:      General: She is not in acute distress.    Appearance: Normal appearance. She is well-developed. She is not diaphoretic.  HENT:     Head: Normocephalic and atraumatic.     Right Ear: Tympanic membrane, ear  canal and external ear normal.     Left Ear: Tympanic membrane, ear canal and external ear normal.     Nose: Nose normal.     Mouth/Throat:     Mouth: Mucous membranes are moist.     Pharynx: Oropharynx is clear. No oropharyngeal exudate.  Eyes:     General: No scleral icterus.    Conjunctiva/sclera: Conjunctivae normal.     Pupils: Pupils are equal, round, and reactive to light.  Neck:     Thyroid: No thyromegaly.  Cardiovascular:     Rate and Rhythm: Normal rate and regular rhythm.     Pulses: Normal pulses.     Heart sounds: Normal heart sounds. No murmur heard. Pulmonary:     Effort: Pulmonary effort is normal. No respiratory distress.     Breath sounds: Normal breath sounds. No wheezing or rales.  Abdominal:     General: There is no distension.     Palpations: Abdomen is soft.     Tenderness: There is no abdominal tenderness.  Musculoskeletal:        General: No deformity.     Cervical back: Neck supple.     Right lower leg: No edema.     Left lower leg: No edema.  Lymphadenopathy:     Cervical: No cervical adenopathy.  Skin:    General: Skin is warm and dry.     Findings: No rash.  Neurological:     Mental Status: She is alert and oriented to person, place, and time. Mental status is at baseline.  Psychiatric:        Mood and Affect: Mood normal.        Behavior: Behavior normal.      Last depression screening scores PHQ 2/9 Scores 10/22/2021 10/09/2021 07/17/2021  PHQ - 2 Score 2 0 0  PHQ- 9 Score 17 - -  Some encounter information is confidential and restricted. Go to Review Flowsheets activity to see all data.   Last fall risk screening Fall Risk  10/22/2021  Falls in the past year? 0  Number falls in past yr: 0  Injury with Fall? 0  Risk for fall due to : -  Follow up -   Last Audit-C alcohol use screening Alcohol Use Disorder Test (AUDIT) 10/22/2021  1. How often do you have a drink containing alcohol? 0  2. How many drinks containing alcohol do you  have on a typical day when you are drinking? 0  3. How often do you have six or more drinks on one occasion? 0  AUDIT-C Score 0  Alcohol Brief Interventions/Follow-up -   A score of 3 or more in women, and 4 or more in men indicates increased risk for alcohol abuse, EXCEPT if all of the points are from question 1   No results found for any visits on 10/22/21.  Assessment & Plan  Routine Health Maintenance and Physical Exam  Exercise Activities and Dietary recommendations  Goals   None     Immunization History  Administered Date(s) Administered   Pneumococcal Polysaccharide-23 03/07/2020   Tdap 03/07/2020    Health Maintenance  Topic Date Due   COVID-19 Vaccine (1) Never done   OPHTHALMOLOGY EXAM  Never done   Hepatitis C Screening  Never done   Zoster Vaccines- Shingrix (1 of 2) Never done   MAMMOGRAM  10/08/2020   Pneumococcal Vaccine 18-55 Years old (2 - PCV) 03/07/2021   URINE MICROALBUMIN  03/29/2021   HEMOGLOBIN [name]  09/22/2021   INFLUENZA VACCINE  02/01/2022 (Originally 06/04/2021)   FOOT EXAM  10/22/2022   COLONOSCOPY (Pts 45-22yrs Insurance coverage will need to be confirmed)  11/21/2022   TETANUS/TDAP  03/07/2030   HIV Screening  Completed   HPV VACCINES  Aged Out    Discussed health benefits of physical activity, and encouraged her to engage in regular exercise appropriate for her age and condition.  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)    Stable Cr on recent labs Avoid NSAIDs and other nephrotoxic meds Important to keep DM and BP well controlled      Diabetic peripheral neuropathy (HCC) (Chronic)    Chronic and stable      Hypothyroidism    F/b endocrinology Next appt in February      Type 2  diabetes mellitus with stage 3 chronic kidney disease, without long-term current use of insulin (HCC)    Previously well controlled Continue ozempic prevnar 20 and foot exam today ROI for last eye exam Repeat [name], urine micro/Cr  On statin F/u in 92m      Relevant Orders   Hemoglobin [name]   Pneumococcal conjugate vaccine 20-valent (Prevnar 20)   Urine Microalbumin w/creat. ratio   Hyperlipidemia associated with type 2 diabetes mellitus ([place])    Previously well controlled Continue statin Repeat FLP and CMP      Relevant Orders   Lipid panel     Other   Bipolar 1 disorder (HCC) (Chronic)    F/b Behavioral health No changes to medications today      Morbid obesity ([place])    Discussed importance of healthy weight management Discussed diet and exercise F/b Healthy Weight and Wellness      Chronic pain of both knees    Longstanding, worsening She is looking to find an Ortho in [facility] Referral placed today      Relevant Orders   Ambulatory referral to Orthopedic Surgery   Other Visit Diagnoses     Encounter for annual physical exam    -  Primary   Need for hepatitis C screening test       Relevant Orders   Hepatitis C Antibody        Return in about 6 months (around 04/22/2022) for chronic disease f/u.     I, Lavon Paganini, MD, have reviewed all documentation for this visit. The documentation on 10/22/21 for the exam, diagnosis, procedures, and orders are all accurate and complete.   Hattie Aguinaldo, Dionne Bucy, MD, MPH [facility]

## 2021-10-22 ENCOUNTER — Ambulatory Visit (INDEPENDENT_AMBULATORY_CARE_PROVIDER_SITE_OTHER): Payer: Medicaid Other | Admitting: Family Medicine

## 2021-10-22 ENCOUNTER — Other Ambulatory Visit: Payer: Self-pay

## 2021-10-22 ENCOUNTER — Encounter: Payer: Self-pay | Admitting: Family Medicine

## 2021-10-22 VITALS — BP 131/81 | HR 69 | Resp 16 | Ht 70.0 in | Wt 284.6 lb

## 2021-10-22 DIAGNOSIS — F319 Bipolar disorder, unspecified: Secondary | ICD-10-CM | POA: Diagnosis not present

## 2021-10-22 DIAGNOSIS — E1169 Type 2 diabetes mellitus with other specified complication: Secondary | ICD-10-CM

## 2021-10-22 DIAGNOSIS — E1159 Type 2 diabetes mellitus with other circulatory complications: Secondary | ICD-10-CM | POA: Diagnosis not present

## 2021-10-22 DIAGNOSIS — E039 Hypothyroidism, unspecified: Secondary | ICD-10-CM

## 2021-10-22 DIAGNOSIS — Z23 Encounter for immunization: Secondary | ICD-10-CM | POA: Diagnosis not present

## 2021-10-22 DIAGNOSIS — E785 Hyperlipidemia, unspecified: Secondary | ICD-10-CM

## 2021-10-22 DIAGNOSIS — M25562 Pain in left knee: Secondary | ICD-10-CM

## 2021-10-22 DIAGNOSIS — Z0000 Encounter for general adult medical examination without abnormal findings: Secondary | ICD-10-CM | POA: Diagnosis not present

## 2021-10-22 DIAGNOSIS — N183 Chronic kidney disease, stage 3 unspecified: Secondary | ICD-10-CM

## 2021-10-22 DIAGNOSIS — M25561 Pain in right knee: Secondary | ICD-10-CM

## 2021-10-22 DIAGNOSIS — I152 Hypertension secondary to endocrine disorders: Secondary | ICD-10-CM | POA: Diagnosis not present

## 2021-10-22 DIAGNOSIS — E1142 Type 2 diabetes mellitus with diabetic polyneuropathy: Secondary | ICD-10-CM

## 2021-10-22 DIAGNOSIS — N1831 Chronic kidney disease, stage 3a: Secondary | ICD-10-CM | POA: Diagnosis not present

## 2021-10-22 DIAGNOSIS — E1122 Type 2 diabetes mellitus with diabetic chronic kidney disease: Secondary | ICD-10-CM

## 2021-10-22 DIAGNOSIS — Z1159 Encounter for screening for other viral diseases: Secondary | ICD-10-CM

## 2021-10-22 DIAGNOSIS — G8929 Other chronic pain: Secondary | ICD-10-CM

## 2021-10-22 NOTE — Assessment & Plan Note (Signed)
Previously well controlled Continue statin Repeat FLP and CMP  

## 2021-10-22 NOTE — Assessment & Plan Note (Signed)
F/b Behavioral health No changes to medications today

## 2021-10-22 NOTE — Patient Instructions (Signed)
   The CDC recommends two doses of Shingrix (the shingles vaccine) separated by 2 to 6 months for adults age 55 years and older. I recommend checking with your insurance plan regarding coverage for this vaccine.   

## 2021-10-22 NOTE — Assessment & Plan Note (Addendum)
Previously well controlled Continue ozempic prevnar 20 and foot exam today ROI for last eye exam Repeat [name], urine micro/Cr  On statin F/u in 60m

## 2021-10-22 NOTE — Assessment & Plan Note (Signed)
Well controlled Continue current medications Reviewed last metabolic panel

## 2021-10-22 NOTE — Assessment & Plan Note (Signed)
Discussed importance of healthy weight management Discussed diet and exercise F/b Healthy Weight and Wellness

## 2021-10-22 NOTE — Assessment & Plan Note (Signed)
Chronic and stable.   

## 2021-10-22 NOTE — Assessment & Plan Note (Signed)
Stable Cr on recent labs Avoid NSAIDs and other nephrotoxic meds Important to keep DM and BP well controlled

## 2021-10-22 NOTE — Assessment & Plan Note (Signed)
Longstanding, worsening She is looking to find an Ortho in [place] Referral placed today

## 2021-10-22 NOTE — Assessment & Plan Note (Signed)
F/b endocrinology Next appt in February

## 2021-10-25 ENCOUNTER — Telehealth (HOSPITAL_COMMUNITY): Payer: Self-pay | Admitting: *Deleted

## 2021-10-25 NOTE — Telephone Encounter (Signed)
Patient and will be stopping by the office to get release of information so Dr. Harrington Challenger can complete forms and letter for her to provided it the place she's trying to get services at.   Patient instructed to make sure she leaves a note on the release of what she would like provider to help her with and what she's needing from provider. Patient verbalized understanding and agreed.

## 2021-10-26 DIAGNOSIS — F3132 Bipolar disorder, current episode depressed, moderate: Secondary | ICD-10-CM | POA: Diagnosis not present

## 2021-10-27 ENCOUNTER — Other Ambulatory Visit: Payer: Self-pay | Admitting: Family Medicine

## 2021-10-27 NOTE — Telephone Encounter (Signed)
Requested medication (s) are due for refill today: yes  Requested medication (s) are on the active medication list: yes  Last refill:  09/06/21 #90 1 RF  Future visit scheduled: yes in 5 months  Notes to clinic:  last Cr 1.23 K 3.8    Requested Prescriptions  Pending Prescriptions Disp Refills   potassium chloride (KLOR-CON M) 10 MEQ tablet [Pharmacy Med Name: potassium chloride ER 10 mEq tablet,extended release(part/cryst)] 90 tablet 1    Sig: TAKE 1 TABLET BY [place]     Endocrinology:  Minerals - Potassium Supplementation Failed - 10/27/2021  8:01 AM      Failed - Cr in normal range and within 360 days    Creatinine, Ser  Date Value Ref Range Status  04/12/2021 1.23 (H) 0.44 - 1.00 mg/dL Final          Passed - K in normal range and within 360 days    Potassium  Date Value Ref Range Status  04/12/2021 3.8 3.5 - 5.1 mmol/L Final          Passed - Valid encounter within last 12 months    Recent Outpatient Visits           5 days ago Encounter for annual physical exam   [facility] [place], Dionne Bucy, MD   3 weeks ago Dyspnea on exertion   [facility], Dionne Bucy, MD   4 months ago Urinary frequency   [facility] [place], Dionne Bucy, MD   7 months ago Polyuria   [place], PA-C   7 months ago Pneumonia of left lung due to infectious organism, unspecified part of lung   [facility] Jon Billings, NP       Future Appointments             In 1 month Domenic Polite, Aloha Gell, MD [address], [place]   In 2 months Hollice Espy, MD [place]   In 5 months Bacigalupo, Dionne Bucy, MD [facility], [place]

## 2021-10-31 DIAGNOSIS — F3132 Bipolar disorder, current episode depressed, moderate: Secondary | ICD-10-CM | POA: Diagnosis not present

## 2021-11-01 DIAGNOSIS — F3132 Bipolar disorder, current episode depressed, moderate: Secondary | ICD-10-CM | POA: Diagnosis not present

## 2021-11-06 ENCOUNTER — Other Ambulatory Visit (HOSPITAL_COMMUNITY)
Admission: RE | Admit: 2021-11-06 | Discharge: 2021-11-06 | Disposition: A | Discharge status: Home / Self Care | Payer: Medicaid Other | Source: Ambulatory Visit | Attending: Family Medicine | Admitting: Family Medicine

## 2021-11-06 ENCOUNTER — Encounter: Payer: Self-pay | Admitting: Family Medicine

## 2021-11-06 ENCOUNTER — Telehealth: Payer: Self-pay

## 2021-11-06 ENCOUNTER — Other Ambulatory Visit: Payer: Self-pay

## 2021-11-06 DIAGNOSIS — N1831 Chronic kidney disease, stage 3a: Secondary | ICD-10-CM | POA: Diagnosis not present

## 2021-11-06 DIAGNOSIS — E785 Hyperlipidemia, unspecified: Secondary | ICD-10-CM | POA: Diagnosis not present

## 2021-11-06 DIAGNOSIS — E1169 Type 2 diabetes mellitus with other specified complication: Secondary | ICD-10-CM | POA: Diagnosis not present

## 2021-11-06 DIAGNOSIS — E1122 Type 2 diabetes mellitus with diabetic chronic kidney disease: Secondary | ICD-10-CM | POA: Insufficient documentation

## 2021-11-06 DIAGNOSIS — Z1159 Encounter for screening for other viral diseases: Secondary | ICD-10-CM | POA: Diagnosis not present

## 2021-11-06 LAB — LIPID PANEL
Cholesterol: 191 mg/dL (ref 0–200)
HDL: 38 mg/dL — ABNORMAL LOW (ref >40)
LDL Cholesterol: 116 mg/dL — ABNORMAL HIGH (ref 0–99)
Total CHOL/HDL Ratio: 5 RATIO
Triglycerides: 187 mg/dL — ABNORMAL HIGH (ref <150)
VLDL: 37 mg/dL (ref 0–40)

## 2021-11-06 LAB — HEMOGLOBIN A1C
Hgb A1c MFr Bld: 6.2 % — ABNORMAL HIGH (ref 4.8–5.6)
Mean Plasma Glucose: 131.24 mg/dL

## 2021-11-06 LAB — HEPATITIS C ANTIBODY: HCV Ab: NONREACTIVE

## 2021-11-06 MED ORDER — ATORVASTATIN CALCIUM 40 MG PO TABS: 40.0000 mg | ORAL_TABLET | Freq: Every day | ORAL | 1 refills | Status: DC

## 2021-11-06 NOTE — Telephone Encounter (Signed)
-----   Message from [name], MD sent at 11/06/2021 11:33 AM EST ----- Cholesterol has improved significantly from last check, but does remain above goal of LDL (bad cholesterol) <70.  Recommend increasing atorvastatin to 40 mg daily. Ok to send new Rx if patient agrees.

## 2021-11-07 ENCOUNTER — Ambulatory Visit (INDEPENDENT_AMBULATORY_CARE_PROVIDER_SITE_OTHER): Payer: Medicaid Other | Admitting: Vascular Surgery

## 2021-11-07 ENCOUNTER — Encounter: Payer: Self-pay | Admitting: Vascular Surgery

## 2021-11-07 ENCOUNTER — Ambulatory Visit (HOSPITAL_COMMUNITY)
Admission: RE | Admit: 2021-11-07 | Discharge: 2021-11-07 | Disposition: A | Discharge status: Home / Self Care | Payer: Medicaid Other | Source: Ambulatory Visit | Attending: Vascular Surgery | Admitting: Vascular Surgery

## 2021-11-07 VITALS — BP 136/75 | HR 80 | Temp 98.2°F | Resp 20 | Ht 70.0 in | Wt 275.0 lb

## 2021-11-07 DIAGNOSIS — F3132 Bipolar disorder, current episode depressed, moderate: Secondary | ICD-10-CM | POA: Diagnosis not present

## 2021-11-07 DIAGNOSIS — I871 Compression of vein: Secondary | ICD-10-CM | POA: Diagnosis not present

## 2021-11-07 DIAGNOSIS — I87002 Postthrombotic syndrome without complications of left lower extremity: Secondary | ICD-10-CM | POA: Diagnosis not present

## 2021-11-07 LAB — MICROALBUMIN / CREATININE URINE RATIO
Creatinine, Urine: 138.7 mg/dL
Microalb Creat Ratio: 171 mg/g creat — ABNORMAL HIGH (ref 0–29)
Microalb, Ur: 236.7 ug/mL — ABNORMAL HIGH

## 2021-11-07 NOTE — Progress Notes (Signed)
Patient ID: Jane [name], female   DOB: 05-23-1966, 56 y.o.   MRN: [id number]  Reason for Consult: Follow-up   Referred by [name], MD  Subjective:     HPI:  Jane [name] is a 56 y.o. female history of extensive DVT including a left to right femorofemoral venous bypass that thrombosed and subsequent left common and external iliac vein and common femoral vein stenting.  At last visit she was having increased swelling of the left lower extremity at the time of diagnosis of [facility] spotted fever.  She remains on Coumadin.  More recently she has had bilateral knee pain left greater than right.  She did have an arthroscopic surgery of the left 1 year ago which resolved her pain for a few months but now it has returned.  Pain is so bad that it hurts at rest.  She has no worsened swelling in the left lower extremity from previous and no swelling on the right previous she has no skin changes.  She does not wear compression stockings.  Past Medical History:  Diagnosis Date   ADD (attention deficit disorder)    Anxiety    Back pain    Bilateral swelling of feet    Bipolar 1 disorder (HCC)    Bipolar 1 disorder (HCC)    Bipolar disorder (HCC)    Chewing difficulty    Chronic fatigue syndrome    Chronic kidney disease    Stage 3 kidney disease;dx by Dr. Sinda Du.    Constipation    Depression    Diabetes mellitus without complication (HCC)    diet controlled   Dyspnea    with exertion   GERD (gastroesophageal reflux disease)    Headache    migraines   High cholesterol    History of blood clots    History of DVT (deep vein thrombosis)    left leg   Hypertension    states under control with meds., has been on med. x 2 years   Hypothyroidism    Joint pain    Neuropathy    Obsessive-compulsive disorder    Peripheral vascular disease ([place])    Prediabetes    Respiratory failure requiring intubation ([place])    Restless leg syndrome    Shortness of breath     Sleep apnea    Thrombocytopenia ([place]) 09/15/2019   Trigger thumb of left hand 01/2018   Trigger thumb of right hand    Family History  Problem Relation Age of Onset   Heart disease Mother    Hyperlipidemia Mother    Hypertension Mother    Bipolar disorder Mother    Stroke Mother    Depression Mother    Sleep apnea Mother    Obesity Mother    Diabetes Father    Heart disease Father    Hyperlipidemia Father    Hypertension Father    Sleep apnea Father    Obesity Father    Drug abuse Daughter    ADD / ADHD Daughter    Drug abuse Daughter    Anxiety disorder Daughter    Bipolar disorder Daughter    Hypertension Sister    Hypertension Brother    Hyperlipidemia Brother    Heart disease Brother    Bipolar disorder Maternal Aunt    Suicidality Maternal Aunt    Past Surgical History:  Procedure Laterality Date   ABDOMINAL HYSTERECTOMY  06/2016   complete   APPLICATION OF WOUND VAC Left 04/27/2019   Procedure: APPLICATION  OF WOUND VAC LEFT GROIN;  Surgeon: Waynetta Sandy, MD;  Location: [place];  Service: Vascular;  Laterality: Left;   AV FISTULA PLACEMENT Left 11/24/2018   Procedure: ARTERIOVENOUS (AV) FISTULA CREATION LEFT SFA TO LEFT FEMORAL VEIN;  Surgeon: Waynetta Sandy, MD;  Location: [place];  Service: Vascular;  Laterality: Left;   BACK SURGERY     CHOLECYSTECTOMY     COLONOSCOPY WITH PROPOFOL N/A 11/22/2019   Procedure: COLONOSCOPY WITH PROPOFOL;  Surgeon: [name], MD;  Location: [facility];  Service: Gastroenterology;  Laterality: N/A;   FEMORAL ARTERY EXPLORATION  03/30/2019   Procedure: Left Common Femoral Artery and Vein Exploration;  Surgeon: Waynetta Sandy, MD;  Location: [place];  Service: Vascular;;   FEMORAL-FEMORAL BYPASS GRAFT Left 11/24/2018   Procedure: BYPASS GRAFT FEMORAL-FEMORAL VENOUS LEFT TO RIGHT PALMA PROCEDURE USING CRYOVEIN;  Surgeon: Waynetta Sandy, MD;  Location: [place];  Service: Vascular;  Laterality:  Left;   GROIN DEBRIDEMENT Left 04/27/2019   Procedure: GROIN DEBRIDEMENT;  Surgeon: Waynetta Sandy, MD;  Location: [place];  Service: Vascular;  Laterality: Left;   INSERTION OF ILIAC STENT  03/30/2019   Procedure: Stent of left common, external iliac veins and left common femoral vein;  Surgeon: Waynetta Sandy, MD;  Location: [place];  Service: Vascular;;   KNEE ARTHROSCOPY WITH MENISCAL REPAIR Left 11/14/2020   Procedure: LEFT KNEE ARTHROSCOPY WITH PARTIAL MEDIAL MENISCECTOMY;  Surgeon: Carole Civil, MD;  Location: [facility];  Service: Orthopedics;  Laterality: Left;   LOWER EXTREMITY VENOGRAPHY N/A 08/17/2018   Procedure: LOWER EXTREMITY VENOGRAPHY - Central Venogram;  Surgeon: Waynetta Sandy, MD;  Location: [place] [facility];  Service: Cardiovascular;  Laterality: N/A;   LOWER EXTREMITY VENOGRAPHY Bilateral 03/09/2019   Procedure: LOWER EXTREMITY VENOGRAPHY;  Surgeon: Waynetta Sandy, MD;  Location: [place] [facility];  Service: Cardiovascular;  Laterality: Bilateral;   LOWER EXTREMITY VENOGRAPHY Left 08/16/2019   Procedure: LOWER EXTREMITY VENOGRAPHY;  Surgeon: Waynetta Sandy, MD;  Location: [place] [facility];  Service: Cardiovascular;  Laterality: Left;   LUMBAR FUSION  11/21/2000   L5-S1   LUMBAR SPINE SURGERY     x 2 others   PATCH ANGIOPLASTY Left 03/30/2019   Procedure: Patch Angioplasty of the Left Common Femoral Vein using Venosure Biologic patch;  Surgeon: Waynetta Sandy, MD;  Location: [place];  Service: Vascular;  Laterality: Left;   PERIPHERAL VASCULAR INTERVENTION Left 08/16/2019   Procedure: PERIPHERAL VASCULAR INTERVENTION;  Surgeon: Waynetta Sandy, MD;  Location: [place] [facility];  Service: Cardiovascular;  Laterality: Left;  common femoral/femoral vein stent   TRIGGER FINGER RELEASE Right 12/01/2017   Procedure: RELEASE TRIGGER FINGER/A-1 PULLEY RIGHT THUMB;  Surgeon: Leanora Cover, MD;  Location: [place];  Service: Orthopedics;  Laterality: Right;   TRIGGER FINGER RELEASE Left 01/26/2018   Procedure: LEFT TRIGGER THUMB RELEASE;  Surgeon: Leanora Cover, MD;  Location: [place];  Service: Orthopedics;  Laterality: Left;   ULTRASOUND GUIDANCE FOR VASCULAR ACCESS Right 03/30/2019   Procedure: Ultrasound-guided cannulation right internal jugular vein;  Surgeon: Waynetta Sandy, MD;  Location: [place];  Service: Vascular;  Laterality: Right;    Short Social History:  Social History   Tobacco Use   Smoking status: Never   Smokeless tobacco: Never  Substance Use Topics   Alcohol use: No    Allergies  Allergen Reactions   Aripiprazole Other (See Comments)    BECOMES  VIOLENT    Seroquel [Quetiapine Fumarate] Other (See Comments)    BECOMES VIOLENT   Chlorpromazine Other (See Comments)    SEVERE ANXIETY    Gabapentin Other (See Comments)    NIGHTMARES     Current Outpatient Medications  Medication Sig Dispense Refill   ALPRAZolam (XANAX) 1 MG tablet Take 1 tablet (1 mg total) by mouth 2 (two) times daily as needed for anxiety. 60 tablet 2   amitriptyline (ELAVIL) 25 MG tablet Take 25 mg by mouth at bedtime.     amLODipine (NORVASC) 5 MG tablet Take 1 tablet (5 mg total) by mouth daily. 90 tablet 0   amphetamine-dextroamphetamine (ADDERALL) 30 MG tablet Take 1 tablet by mouth 2 (two) times daily. 60 tablet 0   amphetamine-dextroamphetamine (ADDERALL) 30 MG tablet Take 1 tablet by mouth 2 (two) times daily. 60 tablet 0   amphetamine-dextroamphetamine (ADDERALL) 30 MG tablet Take 1 tablet by mouth 2 (two) times daily. 60 tablet 0   aspirin EC 81 MG tablet Take 81 mg by mouth daily.     atorvastatin (LIPITOR) 40 MG tablet Take 1 tablet (40 mg total) by mouth daily. 90 tablet 1   capsaicin (ZOSTRIX) 0.025 % cream Apply topically 2 (two) times daily. 60 g 0   cefpodoxime (VANTIN) 200 MG tablet Take 200 mg by mouth 2 (two) times daily.      cetirizine (ZYRTEC) 10 MG tablet TAKE 1 TABLET BY MOUTH DAILY 90 tablet 1   conjugated estrogens (PREMARIN) vaginal cream Estrogen Cream Instruction  Discard applicator  Apply pea sized amount to tip of finger to urethra before bed. Wash hands well after application. Use Monday, Wednesday and Friday 42.5 g 12   cycloSPORINE (RESTASIS) 0.05 % ophthalmic emulsion Place 1 drop into both eyes 2 (two) times daily.     FLUoxetine (PROZAC) 20 MG capsule Take 1 capsule (20 mg total) by mouth daily. 30 capsule 2   FLUoxetine (PROZAC) 40 MG capsule TAKE 1 TABLET BY MOUTH DAILY 90 capsule 2   GARLIC PO Take 7,253 mg by mouth daily.     hydrochlorothiazide (HYDRODIURIL) 25 MG tablet Take 1 tablet (25 mg total) by mouth daily. Please schedule office visit before any future refills. 90 tablet 0   HYDROcodone-acetaminophen (NORCO) 10-325 MG tablet Take 1 tablet by mouth every 6 (six) hours as needed. To last 30 days from fill date 120 tablet 0   [START ON 11/22/2021] HYDROcodone-acetaminophen (NORCO) 10-325 MG tablet Take 1 tablet by mouth every 6 (six) hours as needed. To last 30 days from fill date 120 tablet 0   [START ON 12/22/2021] HYDROcodone-acetaminophen (NORCO) 10-325 MG tablet Take 1 tablet by mouth every 6 (six) hours as needed. To last 30 days from fill date 120 tablet 0   levothyroxine (SYNTHROID) 125 MCG tablet Take 125 mcg by mouth daily before breakfast.     LINZESS 145 MCG CAPS capsule TAKE ONE CAPSULE BY MOUTH DAILY (Patient taking differently: Take 145 mcg by mouth daily as needed.) 90 capsule 3   methocarbamol (ROBAXIN) 750 MG tablet Take 1 tablet (750 mg total) by mouth 2 (two) times daily as needed for muscle spasms. 60 tablet 2   Multiple Vitamin (MULTIVITAMIN) capsule Take 1 capsule by mouth daily.     Olopatadine HCl 0.2 % SOLN Place 1 drop into both eyes daily.     Omega-3 Fatty Acids (OMEGA-3 FISH OIL PO) Take 600 mg by mouth daily.     pantoprazole (PROTONIX) 40 MG tablet  TAKE 1  TABLET BY MOUTH DAILY (Patient taking differently: Take 40 mg by mouth daily.) 90 tablet 3   potassium chloride (KLOR-CON M) 10 MEQ tablet TAKE 1 TABLET BY MOUTH THREE TIMES DAILY 90 tablet 1   pregabalin (LYRICA) 100 MG capsule Take 100 mg by mouth 4 (four) times daily.     rOPINIRole (REQUIP) 2 MG tablet TAKE 1 TABLET BY MOUTH TWICE DAILY (Patient taking differently: Take 2 mg by mouth 2 (two) times daily.) 180 tablet 3   Semaglutide,0.25 or 0.5MG /DOS, (OZEMPIC, 0.25 OR 0.5 MG/DOSE,) 2 MG/1.5ML SOPN Inject 0.25 mg into the skin once a week. 1.5 mL 0   Vibegron (GEMTESA) 75 MG TABS Take 75 mg by mouth daily. 30 tablet 11   warfarin (COUMADIN) 5 MG tablet 1/2 tablet on Monday and Wednesday 1 tablet all other days 60 tablet 3   No current facility-administered medications for this visit.    Review of Systems  Constitutional:  Constitutional negative. HENT: HENT negative.  Eyes: Eyes negative.  Cardiovascular: Positive for leg swelling.  GI: Gastrointestinal negative.  Musculoskeletal: Musculoskeletal negative.  Skin: Skin negative.  Neurological: Neurological negative. Hematologic: Hematologic/lymphatic negative.  Psychiatric: Psychiatric negative.       Objective:  Objective   Vitals:   11/07/21 0930  BP: 136/75  Pulse: 80  Resp: 20  Temp: 98.2 F (36.8 C)  SpO2: 94%     Physical Exam Constitutional:      Appearance: Normal appearance.  HENT:     Head: Normocephalic.     Nose:     Comments: Wearing a mask Eyes:     Pupils: Pupils are equal, round, and reactive to light.  Cardiovascular:     Rate and Rhythm: Normal rate.     Pulses: Normal pulses.  Pulmonary:     Effort: Pulmonary effort is normal.  Abdominal:     General: Abdomen is flat.     Palpations: Abdomen is soft.  Musculoskeletal:     Right lower leg: No edema.     Left lower leg: Edema present.     Comments: RLE 40cm/LLE 42.5cm  Skin:    Capillary Refill: Capillary refill takes less than 2  seconds.  Neurological:     General: No focal deficit present.     Mental Status: She is alert.  Psychiatric:        Mood and Affect: Mood normal.        Behavior: Behavior normal.        Thought Content: Thought content normal.        Judgment: Judgment normal.    Data: IVC/Iliac Findings:  +----------+------+--------+--------+      IVC     Patent Thrombus Comments   +----------+------+--------+--------+   IVC Prox   patent                     +----------+------+--------+--------+   IVC Mid    patent                     +----------+------+--------+--------+   IVC Distal patent                     +----------+------+--------+--------+      +-------------------+---------+-----------+---------+-----------+--------+           CIV         RT-Patent RT-Thrombus LT-Patent LT-Thrombus Comments   +-------------------+---------+-----------+---------+-----------+--------+   Common Iliac Prox    patent  patent                stent     +-------------------+---------+-----------+---------+-----------+--------+   Common Iliac Mid     patent                patent                stent     +-------------------+---------+-----------+---------+-----------+--------+   Common Iliac Distal  patent                patent                stent     +-------------------+---------+-----------+---------+-----------+--------+       +-------------------------+---------+-----------+---------+-----------+----  ----+              EIV             RT-Patent RT-Thrombus LT-Patent LT-Thrombus Comments   +-------------------------+---------+-----------+---------+-----------+----  ----+   External Iliac Vein Prox                         patent                 stent     +-------------------------+---------+-----------+---------+-----------+----  ----+   External Iliac Vein Mid                          patent                 stent      +-------------------------+---------+-----------+---------+-----------+----  ----+   External Iliac Vein                              patent                 stent      Distal                                                                             +-------------------------+---------+-----------+---------+-----------+----  ----+      No obvious stenosis noted at the distal end of the stent.       Summary:  IVC/Iliac: No evidence of thrombus in IVC and Iliac veins. There is no  evidence of thrombus involving the IVC. There is no evidence of thrombus  involving the right common iliac vein and left common iliac vein. There is  no evidence of thrombus involving  the left external iliac vein.      Assessment/Plan:     56 year old female follows up for previous extensive left lower extremity DVT with patent stents and swelling that is stable.  I have again recommended compression stockings but patient is noncompliant with this.  She continues Coumadin.  Her biggest complaints today appear to be centered around her knee pain and she is okay for knee surgery from a vascular standpoint but would need bridging with Lovenox.  She will follow-up with me in 1 year with repeat IVC iliac duplex.     Waynetta Sandy MD [facility]

## 2021-11-08 ENCOUNTER — Telehealth: Payer: Self-pay

## 2021-11-08 ENCOUNTER — Encounter: Payer: Self-pay | Admitting: Family Medicine

## 2021-11-08 DIAGNOSIS — E1159 Type 2 diabetes mellitus with other circulatory complications: Secondary | ICD-10-CM

## 2021-11-08 DIAGNOSIS — N390 Urinary tract infection, site not specified: Secondary | ICD-10-CM | POA: Diagnosis not present

## 2021-11-08 DIAGNOSIS — I152 Hypertension secondary to endocrine disorders: Secondary | ICD-10-CM

## 2021-11-08 DIAGNOSIS — R350 Frequency of micturition: Secondary | ICD-10-CM | POA: Diagnosis not present

## 2021-11-08 MED ORDER — LISINOPRIL 5 MG PO TABS: 5.0000 mg | ORAL_TABLET | Freq: Every day | ORAL | 1 refills | Status: DC

## 2021-11-08 NOTE — Telephone Encounter (Signed)
-----   Message from [name], MD sent at 11/08/2021 10:28 AM EST ----- [name] is good. Hep C screen negative.  Urine microalbumin (protein) is high. Recommend starting lisinopril 5mg  daily to protect the kidneys.  Ok to send #90 r1 if patient agrees. Needs BMP rechecked in about 2 weeks after starting this medication.

## 2021-11-08 NOTE — Telephone Encounter (Signed)
Let patient know it couldn't be added. She can drop off another sample at [facility] if she wants and we will order the culture

## 2021-11-08 NOTE — Telephone Encounter (Signed)
Patient wanted PCP to know please send antibiotics to   [place], [place] [address]  [address] [place] [address]  Phone: [phone] Fax: [phone]

## 2021-11-08 NOTE — Telephone Encounter (Signed)
Patient advised.

## 2021-11-08 NOTE — Telephone Encounter (Signed)
Patient advised as below. Patient agreed to Lisinopril 5 mg daily.  F/U from [employer]. Let patient know it couldn't be added. She can drop off another sample at [facility] if she wants and we will order the culture.   BMP and urine culture order.

## 2021-11-09 DIAGNOSIS — F3132 Bipolar disorder, current episode depressed, moderate: Secondary | ICD-10-CM | POA: Diagnosis not present

## 2021-11-09 NOTE — Telephone Encounter (Signed)
Patient advised that lisinopril is for kidney protection and not an antibiotic.

## 2021-11-12 DIAGNOSIS — Z1231 Encounter for screening mammogram for malignant neoplasm of breast: Secondary | ICD-10-CM | POA: Diagnosis not present

## 2021-11-14 ENCOUNTER — Other Ambulatory Visit: Payer: Self-pay

## 2021-11-14 ENCOUNTER — Ambulatory Visit (INDEPENDENT_AMBULATORY_CARE_PROVIDER_SITE_OTHER): Payer: Medicaid Other

## 2021-11-14 DIAGNOSIS — F3132 Bipolar disorder, current episode depressed, moderate: Secondary | ICD-10-CM | POA: Diagnosis not present

## 2021-11-14 DIAGNOSIS — Z86718 Personal history of other venous thrombosis and embolism: Secondary | ICD-10-CM

## 2021-11-14 LAB — POCT INR
INR: 1.9 — AB (ref 2.0–3.0)
PT: 22.8

## 2021-11-14 NOTE — Patient Instructions (Signed)
Description   Continue 5mg  daily except 2.5mg  Wednesday.  Recheck in 2 weeks.

## 2021-11-15 DIAGNOSIS — M170 Bilateral primary osteoarthritis of knee: Secondary | ICD-10-CM | POA: Diagnosis not present

## 2021-11-16 DIAGNOSIS — F3132 Bipolar disorder, current episode depressed, moderate: Secondary | ICD-10-CM | POA: Diagnosis not present

## 2021-11-17 DIAGNOSIS — F3132 Bipolar disorder, current episode depressed, moderate: Secondary | ICD-10-CM | POA: Diagnosis not present

## 2021-11-23 DIAGNOSIS — F3132 Bipolar disorder, current episode depressed, moderate: Secondary | ICD-10-CM | POA: Diagnosis not present

## 2021-11-24 DIAGNOSIS — F3132 Bipolar disorder, current episode depressed, moderate: Secondary | ICD-10-CM | POA: Diagnosis not present

## 2021-11-26 ENCOUNTER — Telehealth (HOSPITAL_COMMUNITY): Payer: Self-pay | Admitting: *Deleted

## 2021-11-26 NOTE — Telephone Encounter (Signed)
Patient called and [facility] inquiring about forms due to it not being sent in yet.   Staff called patient to get more information regarding forms and was not able to reach patient and [facility] with office number.

## 2021-11-27 DIAGNOSIS — F3132 Bipolar disorder, current episode depressed, moderate: Secondary | ICD-10-CM | POA: Diagnosis not present

## 2021-11-27 DIAGNOSIS — M170 Bilateral primary osteoarthritis of knee: Secondary | ICD-10-CM | POA: Diagnosis not present

## 2021-11-28 ENCOUNTER — Telehealth: Payer: Self-pay

## 2021-11-28 ENCOUNTER — Ambulatory Visit (INDEPENDENT_AMBULATORY_CARE_PROVIDER_SITE_OTHER): Payer: Medicaid Other

## 2021-11-28 ENCOUNTER — Other Ambulatory Visit: Payer: Self-pay

## 2021-11-28 DIAGNOSIS — F3132 Bipolar disorder, current episode depressed, moderate: Secondary | ICD-10-CM | POA: Diagnosis not present

## 2021-11-28 DIAGNOSIS — Z86718 Personal history of other venous thrombosis and embolism: Secondary | ICD-10-CM

## 2021-11-28 LAB — POCT INR
INR: 2.4 (ref 2.0–3.0)
PT: 29.2

## 2021-11-28 NOTE — Patient Instructions (Signed)
Description   Continue 5mg  daily except 2.5mg  Wednesday.  Recheck in 4 weeks.

## 2021-11-28 NOTE — Telephone Encounter (Signed)
FL2 form being sent back for completion for patient to go into an "apartment" with [facility]. The business card is attached to the [place] form of where to fax the [place].  She also needs a copy of it.    She needs this completed and faxed to Dory Horn at [phone]

## 2021-11-29 ENCOUNTER — Ambulatory Visit (INDEPENDENT_AMBULATORY_CARE_PROVIDER_SITE_OTHER): Payer: Medicaid Other | Admitting: Family Medicine

## 2021-11-30 NOTE — Telephone Encounter (Signed)
Provider has completed form. We just need to print out notes.

## 2021-12-01 DIAGNOSIS — F3132 Bipolar disorder, current episode depressed, moderate: Secondary | ICD-10-CM | POA: Diagnosis not present

## 2021-12-03 ENCOUNTER — Other Ambulatory Visit: Payer: Self-pay

## 2021-12-03 ENCOUNTER — Other Ambulatory Visit (HOSPITAL_COMMUNITY): Payer: Self-pay

## 2021-12-03 ENCOUNTER — Encounter (HOSPITAL_COMMUNITY): Payer: Self-pay | Admitting: Hematology

## 2021-12-03 ENCOUNTER — Telehealth: Payer: Self-pay

## 2021-12-03 ENCOUNTER — Ambulatory Visit (INDEPENDENT_AMBULATORY_CARE_PROVIDER_SITE_OTHER): Payer: Medicaid Other | Admitting: Family Medicine

## 2021-12-03 ENCOUNTER — Encounter (INDEPENDENT_AMBULATORY_CARE_PROVIDER_SITE_OTHER): Payer: Self-pay | Admitting: Family Medicine

## 2021-12-03 VITALS — BP 118/72 | HR 69 | Temp 97.9°F | Ht 70.0 in | Wt 267.0 lb

## 2021-12-03 DIAGNOSIS — E1169 Type 2 diabetes mellitus with other specified complication: Secondary | ICD-10-CM

## 2021-12-03 DIAGNOSIS — E1165 Type 2 diabetes mellitus with hyperglycemia: Secondary | ICD-10-CM | POA: Diagnosis not present

## 2021-12-03 DIAGNOSIS — Z6839 Body mass index (BMI) 39.0-39.9, adult: Secondary | ICD-10-CM | POA: Diagnosis not present

## 2021-12-03 DIAGNOSIS — E669 Obesity, unspecified: Secondary | ICD-10-CM | POA: Diagnosis not present

## 2021-12-03 DIAGNOSIS — Z7985 Long-term (current) use of injectable non-insulin antidiabetic drugs: Secondary | ICD-10-CM

## 2021-12-03 DIAGNOSIS — E785 Hyperlipidemia, unspecified: Secondary | ICD-10-CM | POA: Diagnosis not present

## 2021-12-03 MED ORDER — OZEMPIC (0.25 OR 0.5 MG/DOSE) 2 MG/1.5ML ~~LOC~~ SOPN
0.2500 mg | PEN_INJECTOR | SUBCUTANEOUS | 0 refills | Status: DC
Start: 2021-12-03 — End: 2021-12-31
  Filled 2021-12-03 – 2021-12-04 (×3): qty 1.5, 56d supply, fill #0

## 2021-12-03 NOTE — Telephone Encounter (Signed)
Have you seen this document? Or returned document to [place]? KW

## 2021-12-03 NOTE — Telephone Encounter (Signed)
Form was faxed 

## 2021-12-03 NOTE — Telephone Encounter (Signed)
Copied from [place] [phone]. Topic: General - Other >> Dec 03, 2021  9:21 AM Alanda Slim E wrote: Reason for CRM:Pt called to see if Dr. Brita Romp faxed over the [place] to Mr. Nori Riis / please advise

## 2021-12-03 NOTE — Telephone Encounter (Signed)
Left detailed VM advising patient of message below. KW

## 2021-12-03 NOTE — Progress Notes (Signed)
Chief Complaint:   OBESITY Jane [name] is here to discuss her progress with her obesity treatment plan along with follow-up of her obesity related diagnoses. Jane [name] is on the Category 4 Plan and states she is following her eating plan approximately 75% of the time. Jane [name] states she is walking 60 minutes 7 times per week.  Today's visit was #: 23 Starting weight: 283 lbs Starting date: 03/29/2020 Today's weight: 267 lbs Today's date: 12/03/2021 Total lbs lost to date: 16 Total lbs lost since last in-office visit: 9  Interim History: Pt has been more mindful of quantity she has been getting in recently. She started a new knee injection of triamcinolone. The next few weeks, she wants to continue current plan. Pt may no be able to get all food in but is working on it.  Subjective:   1. Type 2 diabetes mellitus with hyperglycemia, without long-term current use of insulin (HCC) Jane [name] is doing well on Ozempic with no GI side effects.  2. Hyperlipidemia associated with type 2 diabetes mellitus ([place]) Pt is on Lipitor with no myalgias.  Assessment/Plan:   1. Type 2 diabetes mellitus with hyperglycemia, without long-term current use of insulin (HCC) Good blood sugar control is important to decrease the likelihood of diabetic complications such as nephropathy, neuropathy, limb loss, blindness, coronary artery disease, and death. Intensive lifestyle modification including diet, exercise and weight loss are the first line of treatment for diabetes.   Refill- Semaglutide,0.25 or 0.5MG /DOS, (OZEMPIC, 0.25 OR 0.5 MG/DOSE,) 2 MG/1.5ML SOPN; Inject 0.25 mg into the skin once a week.  Dispense: 1.5 mL; Refill: 0  2. Hyperlipidemia associated with type 2 diabetes mellitus ([place]) Cardiovascular risk and specific lipid/LDL goals reviewed.  We discussed several lifestyle modifications today and Jane [name] will continue to work on diet, exercise and weight loss efforts. Orders and follow up as documented in patient  record. Continue Lipitor at current dose with no change in therapy.  Counseling Intensive lifestyle modifications are the first line treatment for this issue. Dietary changes: Increase soluble fiber. Decrease simple carbohydrates. Exercise changes: Moderate to vigorous-intensity aerobic activity 150 minutes per week if tolerated. Lipid-lowering medications: see documented in medical record.  3. Obesity with current BMI of 39.6 Jane [name] is currently in the action stage of change. As such, her goal is to continue with weight loss efforts. She has agreed to the Category 4 Plan.   Exercise goals:  As is  Behavioral modification strategies: increasing lean protein intake, meal planning and cooking strategies, keeping healthy foods in the home, and planning for success.  Jane [name] has agreed to follow-up with our clinic in 3-4 weeks. She was informed of the importance of frequent follow-up visits to maximize her success with intensive lifestyle modifications for her multiple health conditions.   Objective:   Blood pressure 118/72, pulse 69, temperature 97.9 F (36.6 C), height 5\' 10"  (1.778 m), weight 267 lb (121.1 kg), SpO2 100 %. Body mass index is 38.31 kg/m.  General: Cooperative, alert, well developed, in no acute distress. HEENT: Conjunctivae and lids unremarkable. Cardiovascular: Regular rhythm.  Lungs: Normal work of breathing. Neurologic: No focal deficits.   Lab Results  Component Value Date   CREATININE 1.23 (H) 04/12/2021   BUN 33 (H) 04/12/2021   NA 140 04/12/2021   K 3.8 04/12/2021   CL 101 04/12/2021   CO2 32 04/12/2021   Lab Results  Component Value Date   ALT 20 04/12/2021   AST 17 04/12/2021   ALKPHOS 95  04/12/2021   BILITOT 0.6 04/12/2021   Lab Results  Component Value Date   HGBA1C 6.2 (H) 11/06/2021   HGBA1C 6.3 (H) 03/22/2021   HGBA1C 5.8 (A) 10/24/2020   HGBA1C 6.2 (H) 03/29/2020   HGBA1C 6.3 (H) 10/25/2019   Lab Results  Component Value Date    INSULIN 21.5 03/29/2020   Lab Results  Component Value Date   TSH 0.107 (L) 05/18/2020   Lab Results  Component Value Date   CHOL 191 11/06/2021   HDL 38 (L) 11/06/2021   LDLCALC 116 (H) 11/06/2021   TRIG 187 (H) 11/06/2021   CHOLHDL 5.0 11/06/2021   Lab Results  Component Value Date   VD25OH 50.60 01/01/2021   VD25OH 45.30 10/17/2020   VD25OH 37.20 07/18/2020   Lab Results  Component Value Date   WBC 9.3 08/16/2021   HGB 15.2 (H) 08/16/2021   HCT 45.3 08/16/2021   MCV 93.8 08/16/2021   PLT 156 08/16/2021   Lab Results  Component Value Date   IRON 93 08/16/2021   TIBC 348 08/16/2021   FERRITIN 137 08/16/2021   Attestation Statements:   Reviewed by clinician on day of visit: allergies, medications, problem list, medical history, surgical history, family history, social history, and previous encounter notes.  Coral Ceo, CMA, am acting as transcriptionist for Coralie Common, MD.  I have reviewed the above documentation for accuracy and completeness, and I agree with the above. - Coralie Common, MD

## 2021-12-03 NOTE — Telephone Encounter (Signed)
Please see other phone encounter. Thanks.

## 2021-12-03 NOTE — Telephone Encounter (Signed)
Form completed and given to [facility] on Friday to add prob list and med list and fax

## 2021-12-04 ENCOUNTER — Other Ambulatory Visit (HOSPITAL_COMMUNITY): Payer: Self-pay

## 2021-12-04 ENCOUNTER — Encounter: Payer: Self-pay | Admitting: Family Medicine

## 2021-12-04 DIAGNOSIS — F3132 Bipolar disorder, current episode depressed, moderate: Secondary | ICD-10-CM | POA: Diagnosis not present

## 2021-12-05 ENCOUNTER — Encounter (HOSPITAL_COMMUNITY): Payer: Self-pay | Admitting: Hematology

## 2021-12-05 ENCOUNTER — Other Ambulatory Visit (HOSPITAL_COMMUNITY): Payer: Self-pay

## 2021-12-05 DIAGNOSIS — M7989 Other specified soft tissue disorders: Secondary | ICD-10-CM | POA: Diagnosis not present

## 2021-12-05 DIAGNOSIS — F3132 Bipolar disorder, current episode depressed, moderate: Secondary | ICD-10-CM | POA: Diagnosis not present

## 2021-12-05 DIAGNOSIS — M24575 Contracture, left foot: Secondary | ICD-10-CM | POA: Diagnosis not present

## 2021-12-06 ENCOUNTER — Encounter (HOSPITAL_COMMUNITY): Payer: Self-pay | Admitting: Hematology

## 2021-12-08 DIAGNOSIS — F3132 Bipolar disorder, current episode depressed, moderate: Secondary | ICD-10-CM | POA: Diagnosis not present

## 2021-12-10 ENCOUNTER — Other Ambulatory Visit: Payer: Self-pay | Admitting: Family Medicine

## 2021-12-10 ENCOUNTER — Telehealth: Payer: Self-pay | Admitting: *Deleted

## 2021-12-10 NOTE — Telephone Encounter (Signed)
Jane [name] - PA Case ID: [id number] - Rx #: [id number]

## 2021-12-11 ENCOUNTER — Ambulatory Visit: Payer: Medicaid Other | Admitting: Cardiology

## 2021-12-14 ENCOUNTER — Encounter: Payer: Self-pay | Admitting: Family Medicine

## 2021-12-14 ENCOUNTER — Other Ambulatory Visit: Payer: Self-pay

## 2021-12-14 DIAGNOSIS — F3132 Bipolar disorder, current episode depressed, moderate: Secondary | ICD-10-CM | POA: Diagnosis not present

## 2021-12-14 MED ORDER — ATORVASTATIN CALCIUM 40 MG PO TABS: 40.0000 mg | ORAL_TABLET | Freq: Every day | ORAL | 1 refills | Status: DC

## 2021-12-19 ENCOUNTER — Encounter (HOSPITAL_COMMUNITY): Payer: Self-pay | Admitting: Hematology

## 2021-12-19 DIAGNOSIS — F3132 Bipolar disorder, current episode depressed, moderate: Secondary | ICD-10-CM | POA: Diagnosis not present

## 2021-12-21 DIAGNOSIS — F3132 Bipolar disorder, current episode depressed, moderate: Secondary | ICD-10-CM | POA: Diagnosis not present

## 2021-12-22 DIAGNOSIS — F3132 Bipolar disorder, current episode depressed, moderate: Secondary | ICD-10-CM | POA: Diagnosis not present

## 2021-12-24 ENCOUNTER — Ambulatory Visit (HOSPITAL_COMMUNITY): Payer: Medicaid Other | Attending: Orthopedic Surgery | Admitting: Physical Therapy

## 2021-12-24 ENCOUNTER — Encounter (HOSPITAL_COMMUNITY): Payer: Self-pay | Admitting: Hematology

## 2021-12-24 ENCOUNTER — Other Ambulatory Visit: Payer: Self-pay

## 2021-12-24 DIAGNOSIS — Z789 Other specified health status: Secondary | ICD-10-CM | POA: Insufficient documentation

## 2021-12-24 DIAGNOSIS — Z7409 Other reduced mobility: Secondary | ICD-10-CM | POA: Diagnosis not present

## 2021-12-24 DIAGNOSIS — M25562 Pain in left knee: Secondary | ICD-10-CM | POA: Diagnosis not present

## 2021-12-24 DIAGNOSIS — G8929 Other chronic pain: Secondary | ICD-10-CM | POA: Insufficient documentation

## 2021-12-24 DIAGNOSIS — R262 Difficulty in walking, not elsewhere classified: Secondary | ICD-10-CM | POA: Insufficient documentation

## 2021-12-24 DIAGNOSIS — M25561 Pain in right knee: Secondary | ICD-10-CM | POA: Insufficient documentation

## 2021-12-24 NOTE — Patient Instructions (Signed)
Access Code: [email or web address] URL: [email or web address] Date: 12/24/2021 Prepared by: Adalberto Cole  Exercises Supine Active Straight Leg Raise - 2-3 x daily - 7 x weekly - 2 sets - 10 reps Seated Knee Extension AROM - 2-3 x daily - 7 x weekly - 2 sets - 12 reps Standing Knee Flexion - 2-3 x daily - 7 x weekly - 2 sets - 10 reps Sidelying Hip Abduction - 2-3 x daily - 7 x weekly - 2 sets - 10 reps Ankle Pumps in Elevation - 2-3 x daily - 7 x weekly - 20 reps

## 2021-12-24 NOTE — Therapy (Signed)
[name], [place], [address] Phone: [phone]   Fax:  [phone]  Physical Therapy Evaluation  Patient Details  Name: Jane [name] MRN: [id number] Date of Birth: 1966/01/24 Referring Provider (PT): Lovell Sheehan, MD   Encounter Date: 12/24/2021   PT End of Session - 12/24/21 0911     Visit Number 1    Number of Visits 10    Date for PT Re-Evaluation 01/28/22    Authorization Type [place] Medicaid Healthy*    Progress Note Due on Visit 10    PT Start Time 0903    PT Stop Time 0939    PT Time Calculation (min) 36 min    Activity Tolerance Patient tolerated treatment well    Behavior During Therapy [facility] for tasks assessed/performed             Past Medical History:  Diagnosis Date   ADD (attention deficit disorder)    Anxiety    Back pain    Bilateral swelling of feet    Bipolar 1 disorder (HCC)    Bipolar 1 disorder ([place])    Bipolar disorder (HCC)    Chewing difficulty    Chronic fatigue syndrome    Chronic kidney disease    Stage 3 kidney disease;dx by Dr. Sinda Du.    Constipation    Depression    Diabetes mellitus without complication (HCC)    diet controlled   Dyspnea    with exertion   GERD (gastroesophageal reflux disease)    Headache    migraines   High cholesterol    History of blood clots    History of DVT (deep vein thrombosis)    left leg   Hypertension    states under control with meds., has been on med. x 2 years   Hypothyroidism    Joint pain    Neuropathy    Obsessive-compulsive disorder    Peripheral vascular disease ([place])    Prediabetes    Respiratory failure requiring intubation ([place])    Restless leg syndrome    Shortness of breath    Sleep apnea    Thrombocytopenia ([place]) 09/15/2019   Trigger thumb of left hand 01/2018   Trigger thumb of right hand     Past Surgical History:  Procedure Laterality Date   ABDOMINAL HYSTERECTOMY  06/2016   complete    APPLICATION OF WOUND VAC Left 04/27/2019   Procedure: APPLICATION OF WOUND VAC LEFT GROIN;  Surgeon: Waynetta Sandy, MD;  Location: [place];  Service: Vascular;  Laterality: Left;   AV FISTULA PLACEMENT Left 11/24/2018   Procedure: ARTERIOVENOUS (AV) FISTULA CREATION LEFT SFA TO LEFT FEMORAL VEIN;  Surgeon: Waynetta Sandy, MD;  Location: [place];  Service: Vascular;  Laterality: Left;   BACK SURGERY     CHOLECYSTECTOMY     COLONOSCOPY WITH PROPOFOL N/A 11/22/2019   Procedure: COLONOSCOPY WITH PROPOFOL;  Surgeon: [name], MD;  Location: [facility];  Service: Gastroenterology;  Laterality: N/A;   FEMORAL ARTERY EXPLORATION  03/30/2019   Procedure: Left Common Femoral Artery and Vein Exploration;  Surgeon: Waynetta Sandy, MD;  Location: [place];  Service: Vascular;;   FEMORAL-FEMORAL BYPASS GRAFT Left 11/24/2018   Procedure: BYPASS GRAFT FEMORAL-FEMORAL VENOUS LEFT TO RIGHT PALMA PROCEDURE USING CRYOVEIN;  Surgeon: Waynetta Sandy, MD;  Location: [address];  Service: Vascular;  Laterality: Left;   GROIN DEBRIDEMENT Left 04/27/2019   Procedure: [name] DEBRIDEMENT;  Surgeon: Waynetta Sandy,  MD;  Location: [place];  Service: Vascular;  Laterality: Left;   INSERTION OF ILIAC STENT  03/30/2019   Procedure: Stent of left common, external iliac veins and left common femoral vein;  Surgeon: Waynetta Sandy, MD;  Location: [place];  Service: Vascular;;   KNEE ARTHROSCOPY WITH MENISCAL REPAIR Left 11/14/2020   Procedure: LEFT KNEE ARTHROSCOPY WITH PARTIAL MEDIAL MENISCECTOMY;  Surgeon: Carole Civil, MD;  Location: [facility];  Service: Orthopedics;  Laterality: Left;   LOWER EXTREMITY VENOGRAPHY N/A 08/17/2018   Procedure: LOWER EXTREMITY VENOGRAPHY - Central Venogram;  Surgeon: Waynetta Sandy, MD;  Location: [place] [facility];  Service: Cardiovascular;  Laterality: N/A;   LOWER EXTREMITY VENOGRAPHY Bilateral 03/09/2019   Procedure: LOWER  EXTREMITY VENOGRAPHY;  Surgeon: Waynetta Sandy, MD;  Location: [place] [facility];  Service: Cardiovascular;  Laterality: Bilateral;   LOWER EXTREMITY VENOGRAPHY Left 08/16/2019   Procedure: LOWER EXTREMITY VENOGRAPHY;  Surgeon: Waynetta Sandy, MD;  Location: [place] [facility];  Service: Cardiovascular;  Laterality: Left;   LUMBAR FUSION  11/21/2000   L5-S1   LUMBAR SPINE SURGERY     x 2 others   PATCH ANGIOPLASTY Left 03/30/2019   Procedure: Patch Angioplasty of the Left Common Femoral Vein using Venosure Biologic patch;  Surgeon: Waynetta Sandy, MD;  Location: [place];  Service: Vascular;  Laterality: Left;   PERIPHERAL VASCULAR INTERVENTION Left 08/16/2019   Procedure: PERIPHERAL VASCULAR INTERVENTION;  Surgeon: Waynetta Sandy, MD;  Location: [place] [facility];  Service: Cardiovascular;  Laterality: Left;  common femoral/femoral vein stent   TRIGGER FINGER RELEASE Right 12/01/2017   Procedure: RELEASE TRIGGER FINGER/A-1 PULLEY RIGHT THUMB;  Surgeon: Leanora Cover, MD;  Location: [place];  Service: Orthopedics;  Laterality: Right;   TRIGGER FINGER RELEASE Left 01/26/2018   Procedure: LEFT TRIGGER THUMB RELEASE;  Surgeon: Leanora Cover, MD;  Location: [place];  Service: Orthopedics;  Laterality: Left;   ULTRASOUND GUIDANCE FOR VASCULAR ACCESS Right 03/30/2019   Procedure: Ultrasound-guided cannulation right internal jugular vein;  Surgeon: Waynetta Sandy, MD;  Location: [place];  Service: Vascular;  Laterality: Right;    There were no vitals filed for this visit.    Subjective Assessment - 12/24/21 0904     Subjective Patient reports that she had a fall in 2019 and a partial medial meniscectomy of the Lt knee on 11/14/2020. She states that her knee did wel following surgery, but slowly degraded over time as she was told to expect. Her Rt knee began hurting as she tried to offload her weight from the Lt knee to  the point that they now both hurt although the Lt is the worse of the two. She says that she was given the hinge brace she is currently wearing and it does help her knee to some extent. She would like to avoid a Lt TKA for as low as possible as she states the previous surgeries had complications and she does also have a past history positive for DVTs.    Pertinent History x2 femoral artery stents and past DVTs(currently on coumadin)    How long can you sit comfortably? 15 minutes    How long can you stand comfortably? 15 minutes    How long can you walk comfortably? 15 minutes    Patient Stated Goals Avoid/delay Lt TKA    Currently in Pain? Yes    Pain Score 7     Pain Location Knee  Pain Orientation Left    Pain Descriptors / Indicators Sharp;Shooting;Burning    Pain Type Chronic pain    Pain Onset More than a month ago    Pain Frequency Constant    Aggravating Factors  Non movement    Pain Relieving Factors Constant movement    Multiple Pain Sites Yes    Pain Score 5    Pain Location Knee    Pain Orientation Right    Pain Descriptors / Indicators Sharp;Shooting;Burning    Pain Type Chronic pain    Pain Onset More than a month ago    Pain Frequency Constant    Aggravating Factors  Non movement    Pain Relieving Factors Constant movement                [facility] PT Assessment - 12/24/21 0001       Assessment   Medical Diagnosis Bilateral Knee Pain    Referring Provider (PT) Lovell Sheehan, MD    Onset Date/Surgical Date 10/23/21      Precautions   Precautions None      Restrictions   Weight Bearing Restrictions No      Balance Screen   Has the patient fallen in the past 6 months No      [place] residence    Living Arrangements Alone    Type of [place] to enter    Entrance Stairs-Number of Steps 3    Entrance Stairs-Rails Right    [place] One level      Prior Function   Level of Independence  Independent    Vocation On disability      Cognition   Overall Cognitive Status Within Functional Limits for tasks assessed      Sensation   Light Touch --   Chronic nerve damage from previous DVTs, surgeries, and prediabetic     ROM / Strength   AROM / PROM / Strength AROM;Strength      AROM   AROM Assessment Site Knee    Right/Left Knee Right;Left    Right Knee Extension -9    Right Knee Flexion 126    Left Knee Extension -12    Left Knee Flexion 112      Strength   Strength Assessment Site Hip;Knee;Ankle    Right/Left Hip Right;Left    Right Hip Flexion 4+/5    Right Hip ABduction 5/5    Right Hip ADduction 5/5    Left Hip Flexion 4/5    Left Hip ABduction 5/5    Left Hip ADduction 5/5    Right/Left Knee Right;Left    Right Knee Flexion 5/5    Right Knee Extension 5/5    Left Knee Flexion 5/5   medial joint line pain   Left Knee Extension 4+/5   medial joint line pain   Right/Left Ankle Right;Left    Right Ankle Dorsiflexion 5/5    Left Ankle Dorsiflexion 5/5      Palpation   Palpation comment TTP over the Lt patella and medial joint line      Ambulation/Gait   Ambulation/Gait Yes    Ambulation Distance (Feet) 50 Feet    Assistive device None    Gait Pattern Decreased hip/knee flexion - left                        Objective measurements completed on examination: See above findings.  PT Education - 12/24/21 1315     Education Details HEP, POC,and knee osteokinematics    Person(s) Educated Patient    Methods Explanation;Handout;Demonstration    Comprehension Verbalized understanding                 PT Long Term Goals - 12/24/21 1335       PT LONG TERM GOAL #1   Title Patient will achieve a Lt knee extension MMT of 5/5 and without onset of knee pain greater than 3/10.    Time 5    Period Weeks    Target Date 01/28/22      PT LONG TERM GOAL #2   Title Patient will be able to ambulate at least 460ft  with the Lt knee hinge brace and without an AD before any increase in knee pain occurs.    Time 5    Period Weeks    Status New    Target Date 01/28/22      PT LONG TERM GOAL #3   Title Patient will achieve a resting level of bilateral knee pain no greater than 4/10.    Time 5    Period Weeks    Status New    Target Date 01/28/22      PT LONG TERM GOAL #4   Title Patient will be independent with her HEP.    Time 5    Period Weeks    Target Date 01/28/22      PT LONG TERM GOAL #5   Title Patient will achieve a Lt knee flexion and extension PROM within 5 and 3 degrees respectively to that of the Rt knee and without onset of Lt knee pain greater than 3/10.    Time 5    Period Weeks    Status New    Target Date 01/28/22                    Plan - 12/24/21 1315     Clinical Impression Statement Patient is a 56 y.o. female presenting to physical therapy with c/o bilateral knee pain following onset of OA as well as previous partial medial meniscectomy on the Lt knee. She presents with pain limited deficits in knee strength, ROM, endurance, and functional mobility with ADL. She is having to modify and restrict ADL as indicated by subjective information and objective measures which is affecting overall participation. Patient will benefit from skilled physical therapy in order to improve function and reduce impairment.    Personal Factors and Comorbidities Age;Fitness;Comorbidity 1    Comorbidities Prediabetic    Examination-Activity Limitations [place];Caring for Others;Carry;Lift;Stand;Stairs;Squat;Sit;Locomotion Level    Examination-Participation Restrictions Cleaning;Community Activity;Driving;Laundry;Yard Work;Shop    Stability/Clinical Decision Making Stable/Uncomplicated    Clinical Decision Making Low    Rehab Potential Poor    PT Frequency 2x / week    PT Duration Other (comment)   5 weeks   PT Treatment/Interventions ADLs/Self Care Home Management;Therapeutic  activities;Functional mobility training;Stair training;Gait training;Therapeutic exercise;Balance training;Neuromuscular re-education;DME Instruction;Electrical Stimulation;Patient/family education;Passive range of motion;Joint Manipulations;Manual techniques    PT Next Visit Plan Continue with knee mobility program and add in flexion and extension stretches per patient tolerance.    PT Home Exercise Plan Supine Active Straight Leg Raise - 2-3 x daily - 7 x weekly - 2 sets - 10 reps  Seated Knee Extension AROM - 2-3 x daily - 7 x weekly - 2 sets - 12 reps  Standing Knee Flexion - 2-3 x daily - 7 x  weekly - 2 sets - 10 reps  Sidelying Hip Abduction - 2-3 x daily - 7 x weekly - 2 sets - 10 reps  Ankle Pumps in Elevation - 2-3 x daily - 7 x weekly - 20 reps    Consulted and Agree with Plan of Care Patient             Patient will benefit from skilled therapeutic intervention in order to improve the following deficits and impairments:  Abnormal gait, Difficulty walking, Decreased range of motion, Pain, Decreased strength, Decreased activity tolerance, Decreased knowledge of use of DME  Visit Diagnosis: Chronic pain of both knees  Difficulty in walking, not elsewhere classified  Impaired mobility and ADLs     Problem List Patient Active Problem List   Diagnosis Date Noted   Chronic pain of both knees 10/22/2021   Hematuria 10/01/2021   Recurrent UTI 10/01/2021   Dyspnea on exertion 10/01/2021   Iron deficiency anemia 04/18/2021   Nocturnal sleep-related eating disorder 01/01/2021   Vitamin D deficiency 01/01/2021   Arthritis of left knee 12/26/2020   2019 novel coronavirus-infected pneumonia ([facility]) 12/03/2020   S/P left knee arthroscopy 11/14/20  11/21/2020   Old complex tear of medial meniscus of left knee    Primary osteoarthritis of left knee 11/09/2020   Subacromial bursitis of right shoulder joint 11/09/2020   Chronic pain syndrome 04/24/2020   Pharmacologic therapy 04/24/2020    Disorder of skeletal system 04/24/2020   Problems influencing health status 04/24/2020   History of thrombocytopenia 04/24/2020   Abnormal MRI, cervical spine (01/08/2017) 04/24/2020   Abnormal MRI, lumbar spine (05/11/2014) 04/24/2020   DDD (degenerative disc disease), cervical 04/24/2020   DDD (degenerative disc disease), lumbar 04/24/2020   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   Diabetic peripheral neuropathy ([place]) 04/24/2020   Neurogenic pain 04/24/2020   Chronic musculoskeletal pain 04/24/2020   Other proteinuria 03/30/2020   Myalgia 03/15/2020   Bilateral hand swelling 02/16/2020   Thrush 01/20/2020   Morbid obesity ([place]) 10/25/2019   History of back surgery 10/25/2019   Peripheral neuropathy 10/25/2019   Restless leg syndrome 10/25/2019   Chronic low back pain (Bilateral) w/ sciatica (Bilateral) 10/25/2019   Hypertension associated with diabetes ([place]) 10/25/2019   Type 2 diabetes mellitus with stage 3 chronic kidney disease, without long-term current use of insulin ([place]) 10/25/2019   Chronic anticoagulation (Coumadin) 10/25/2019   Hyperlipidemia associated with type 2 diabetes mellitus ([place]) 10/25/2019   CKD stage 3 due to type 2 diabetes mellitus ([place]) 10/25/2019   GERD (gastroesophageal reflux disease) 10/25/2019   Allergic rhinitis 10/25/2019   Chronic constipation 10/25/2019   Hypothyroidism    Normocytic anemia 09/15/2019   Iliac vein stenosis, 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 [place], [place], [address] Phone: [phone]   Fax:  [phone]  Name: Jane [name] MRN: [id number] Date of Birth: 09-08-1966

## 2021-12-25 DIAGNOSIS — F3132 Bipolar disorder, current episode depressed, moderate: Secondary | ICD-10-CM | POA: Diagnosis not present

## 2021-12-26 ENCOUNTER — Other Ambulatory Visit: Payer: Self-pay | Admitting: Family Medicine

## 2021-12-26 ENCOUNTER — Ambulatory Visit: Payer: Medicaid Other

## 2021-12-26 ENCOUNTER — Telehealth: Payer: Self-pay | Admitting: Student in an Organized Health Care Education/Training Program

## 2021-12-26 ENCOUNTER — Other Ambulatory Visit: Payer: Self-pay

## 2021-12-26 DIAGNOSIS — F3132 Bipolar disorder, current episode depressed, moderate: Secondary | ICD-10-CM | POA: Diagnosis not present

## 2021-12-26 DIAGNOSIS — Z86718 Personal history of other venous thrombosis and embolism: Secondary | ICD-10-CM

## 2021-12-26 LAB — POCT INR
INR: 2.9 (ref 2.0–3.0)
PT: 35.2

## 2021-12-26 NOTE — Telephone Encounter (Signed)
Pharmacy states patient needs PA on Hydrocodone. She picked up 20 pills on Saturday, this was all the ins would allow. No she needs new script for 100 remaining pills.

## 2021-12-27 ENCOUNTER — Ambulatory Visit: Payer: Medicaid Other | Admitting: Cardiology

## 2021-12-27 ENCOUNTER — Encounter (HOSPITAL_COMMUNITY): Payer: Self-pay | Admitting: Hematology

## 2021-12-28 ENCOUNTER — Encounter (HOSPITAL_COMMUNITY): Payer: Self-pay

## 2021-12-28 ENCOUNTER — Ambulatory Visit (HOSPITAL_COMMUNITY): Payer: Medicaid Other

## 2021-12-28 ENCOUNTER — Encounter: Payer: Self-pay | Admitting: Student in an Organized Health Care Education/Training Program

## 2021-12-28 ENCOUNTER — Other Ambulatory Visit: Payer: Self-pay

## 2021-12-28 DIAGNOSIS — Z7409 Other reduced mobility: Secondary | ICD-10-CM | POA: Diagnosis not present

## 2021-12-28 DIAGNOSIS — R262 Difficulty in walking, not elsewhere classified: Secondary | ICD-10-CM | POA: Diagnosis not present

## 2021-12-28 DIAGNOSIS — M25561 Pain in right knee: Secondary | ICD-10-CM | POA: Diagnosis not present

## 2021-12-28 DIAGNOSIS — Z789 Other specified health status: Secondary | ICD-10-CM | POA: Diagnosis not present

## 2021-12-28 DIAGNOSIS — M25562 Pain in left knee: Secondary | ICD-10-CM | POA: Diagnosis not present

## 2021-12-28 DIAGNOSIS — F3132 Bipolar disorder, current episode depressed, moderate: Secondary | ICD-10-CM | POA: Diagnosis not present

## 2021-12-28 DIAGNOSIS — G8929 Other chronic pain: Secondary | ICD-10-CM | POA: Diagnosis not present

## 2021-12-28 NOTE — Therapy (Signed)
[place], [place], [address] Phone: [phone]   Fax:  [phone]  Physical Therapy Treatment  Patient Details  Name: Jane [name] MRN: [id number] Date of Birth: Mar 11, 1966 Referring Provider (PT): Lovell Sheehan, MD   Encounter Date: 12/28/2021   PT End of Session - 12/28/21 1538     Visit Number 2    Number of Visits 10    Date for PT Re-Evaluation 01/28/22    Authorization Type [place] Medicaid Healthy*    Progress Note Due on Visit 10    PT Start Time 6270    PT Stop Time 1618    PT Time Calculation (min) 44 min    Activity Tolerance Patient tolerated treatment well    Behavior During Therapy WFL for tasks assessed/performed             Past Medical History:  Diagnosis Date   ADD (attention deficit disorder)    Anxiety    Back pain    Bilateral swelling of feet    Bipolar 1 disorder (HCC)    Bipolar 1 disorder ([place])    Bipolar disorder (HCC)    Chewing difficulty    Chronic fatigue syndrome    Chronic kidney disease    Stage 3 kidney disease;dx by Dr. Sinda Du.    Constipation    Depression    Diabetes mellitus without complication (HCC)    diet controlled   Dyspnea    with exertion   GERD (gastroesophageal reflux disease)    Headache    migraines   High cholesterol    History of blood clots    History of DVT (deep vein thrombosis)    left leg   Hypertension    states under control with meds., has been on med. x 2 years   Hypothyroidism    Joint pain    Neuropathy    Obsessive-compulsive disorder    Peripheral vascular disease ([place])    Prediabetes    Respiratory failure requiring intubation ([place])    Restless leg syndrome    Shortness of breath    Sleep apnea    Thrombocytopenia ([place]) 09/15/2019   Trigger thumb of left hand 01/2018   Trigger thumb of right hand     Past Surgical History:  Procedure Laterality Date   ABDOMINAL HYSTERECTOMY  06/2016   complete    APPLICATION OF WOUND VAC Left 04/27/2019   Procedure: APPLICATION OF WOUND VAC LEFT GROIN;  Surgeon: Waynetta Sandy, MD;  Location: [facility];  Service: Vascular;  Laterality: Left;   AV FISTULA PLACEMENT Left 11/24/2018   Procedure: ARTERIOVENOUS (AV) FISTULA CREATION LEFT SFA TO LEFT FEMORAL VEIN;  Surgeon: Waynetta Sandy, MD;  Location: [facility];  Service: Vascular;  Laterality: Left;   BACK SURGERY     CHOLECYSTECTOMY     COLONOSCOPY WITH PROPOFOL N/A 11/22/2019   Procedure: COLONOSCOPY WITH PROPOFOL;  Surgeon: [name], MD;  [facility];  Service: Gastroenterology;  Laterality: N/A;   FEMORAL ARTERY EXPLORATION  03/30/2019   Procedure: Left Common Femoral Artery and Vein Exploration;  Surgeon: Waynetta Sandy, MD;  Location: [facility];  Service: Vascular;;   FEMORAL-FEMORAL BYPASS GRAFT Left 11/24/2018   Procedure: BYPASS GRAFT FEMORAL-FEMORAL VENOUS LEFT TO RIGHT PALMA PROCEDURE USING CRYOVEIN;  Surgeon: Waynetta Sandy, MD;  Location: [place];  Service: Vascular;  Laterality: Left;   GROIN DEBRIDEMENT Left 04/27/2019   Procedure: [name] DEBRIDEMENT;  Surgeon: Waynetta Sandy,  MD;  Location: [facility];  Service: Vascular;  Laterality: Left;   INSERTION OF ILIAC STENT  03/30/2019   Procedure: Stent of left common, external iliac veins and left common femoral vein;  Surgeon: Waynetta Sandy, MD;  Location: [place];  Service: Vascular;;   KNEE ARTHROSCOPY WITH MENISCAL REPAIR Left 11/14/2020   Procedure: LEFT KNEE ARTHROSCOPY WITH PARTIAL MEDIAL MENISCECTOMY;  Surgeon: Carole Civil, MD;  Location: [facility];  Service: Orthopedics;  Laterality: Left;   LOWER EXTREMITY VENOGRAPHY N/A 08/17/2018   Procedure: LOWER EXTREMITY VENOGRAPHY - Central Venogram;  Surgeon: Waynetta Sandy, MD;  Location: [facility];  Service: Cardiovascular;  Laterality: N/A;   LOWER EXTREMITY VENOGRAPHY Bilateral 03/09/2019   Procedure: LOWER  EXTREMITY VENOGRAPHY;  Surgeon: Waynetta Sandy, MD;  Location: [facility];  Service: Cardiovascular;  Laterality: Bilateral;   LOWER EXTREMITY VENOGRAPHY Left 08/16/2019   Procedure: LOWER EXTREMITY VENOGRAPHY;  Surgeon: Waynetta Sandy, MD;  Location: [facility];  Service: Cardiovascular;  Laterality: Left;   LUMBAR FUSION  11/21/2000   L5-S1   LUMBAR SPINE SURGERY     x 2 others   PATCH ANGIOPLASTY Left 03/30/2019   Procedure: Patch Angioplasty of the Left Common Femoral Vein using Venosure Biologic patch;  Surgeon: Waynetta Sandy, MD;  Location: [facility];  Service: Vascular;  Laterality: Left;   PERIPHERAL VASCULAR INTERVENTION Left 08/16/2019   Procedure: PERIPHERAL VASCULAR INTERVENTION;  Surgeon: Waynetta Sandy, MD;  Location: [facility];  Service: Cardiovascular;  Laterality: Left;  common femoral/femoral vein stent   TRIGGER FINGER RELEASE Right 12/01/2017   Procedure: RELEASE TRIGGER FINGER/A-1 PULLEY RIGHT THUMB;  Surgeon: Leanora Cover, MD;  Location: [facility];  Service: Orthopedics;  Laterality: Right;   TRIGGER FINGER RELEASE Left 01/26/2018   Procedure: LEFT TRIGGER THUMB RELEASE;  Surgeon: Leanora Cover, MD;  Location: [facility];  Service: Orthopedics;  Laterality: Left;   ULTRASOUND GUIDANCE FOR VASCULAR ACCESS Right 03/30/2019   Procedure: Ultrasound-guided cannulation right internal jugular vein;  Surgeon: Waynetta Sandy, MD;  Location: [facility] [facility];  Service: Vascular;  Laterality: Right;    There were no vitals filed for this visit.   Subjective Assessment - 12/28/21 1536     Subjective Pt stated increased Bil knee pain today, pain scale 8/10 increased pain from knee to feet with weight bearing.    Pertinent History x2 femoral artery stents and past DVTs(currently on coumadin)    Patient Stated Goals Avoid/delay Lt TKA    Currently in Pain? Yes    Pain Score 8     Pain  Location Knee    Pain Orientation Left;Right    Pain Descriptors / Indicators Sharp    Pain Type Chronic pain    Pain Onset More than a month ago    Pain Frequency Constant    Aggravating Factors  Non- movement    Pain Relieving Factors unsure    Effect of Pain on Daily Activities limits                               [facility] Adult PT Treatment/Exercise - 12/28/21 0001       Exercises   Exercises Knee/Hip      Knee/Hip Exercises: Aerobic   Stationary Bike 3' full revolution at EOS      Knee/Hip Exercises: Standing   Terminal Knee Extension Both;10 reps  Theraband Level (Terminal Knee Extension) Level 2 (Red)    Terminal Knee Extension Limitations 5" holds      Knee/Hip Exercises: Seated   Long Arc Quad 10 reps;Both    Sit to Sand 10 reps;without UE support   elevated mat height for eccentric control     Knee/Hip Exercises: Supine   Quad Sets Both;10 reps    Quad Sets Limitations 5" holds    Short Arc Quad Sets Both;10 reps    Heel Slides Both;10 reps    Bridges 10 reps    Knee Extension Left    Knee Extension Limitations -7    Knee Flexion Left    Knee Flexion Limitations 120      Manual Therapy   Manual Therapy Joint mobilization    Manual therapy comments Manual complete separate than rest of tx    Joint Mobilization patella mobs all directions, limited medial to lateral on Lt knee                          PT Long Term Goals - 12/24/21 1335       PT LONG TERM GOAL #1   Title Patient will achieve a Lt knee extension MMT of 5/5 and without onset of knee pain greater than 3/10.    Time 5    Period Weeks    Target Date 01/28/22      PT LONG TERM GOAL #2   Title Patient will be able to ambulate at least 463ft with the Lt knee hinge brace and without an AD before any increase in knee pain occurs.    Time 5    Period Weeks    Status New    Target Date 01/28/22      PT LONG TERM GOAL #3   Title Patient will achieve a  resting level of bilateral knee pain no greater than 4/10.    Time 5    Period Weeks    Status New    Target Date 01/28/22      PT LONG TERM GOAL #4   Title Patient will be independent with her HEP.    Time 5    Period Weeks    Target Date 01/28/22      PT LONG TERM GOAL #5   Title Patient will achieve a Lt knee flexion and extension PROM within 5 and 3 degrees respectively to that of the Rt knee and without onset of Lt knee pain greater than 3/10.    Time 5    Period Weeks    Status New    Target Date 01/28/22                   Plan - 12/28/21 1709     Clinical Impression Statement Reviewed goals, educated importance of HEP compliance for maximal benefits, pt able to recall current exercise program.  Session focus with knee mobility.  Pt educated on importance of acheiving TKE to improve gait mechanics and reduce stress on other joint.  Pt tolerated well to session with no reports of increased pain through session, therapist did monitor with new activities.    Personal Factors and Comorbidities Age;Fitness;Comorbidity 1    Comorbidities Prediabetic    Examination-Activity Limitations [place];Caring for Others;Carry;Lift;Stand;Stairs;Squat;Sit;Locomotion Level    Examination-Participation Restrictions Cleaning;Community Activity;Driving;Laundry;Yard Work;Shop    Stability/Clinical Decision Making Stable/Uncomplicated    Clinical Decision Making Low    Rehab Potential Poor    PT Frequency 2x /  week    PT Duration --   5 weeks   PT Treatment/Interventions ADLs/Self Care Home Management;Therapeutic activities;Functional mobility training;Stair training;Gait training;Therapeutic exercise;Balance training;Neuromuscular re-education;DME Instruction;Electrical Stimulation;Patient/family education;Passive range of motion;Joint Manipulations;Manual techniques    PT Next Visit Plan Continue with knee mobility program and add in flexion and extension stretches per patient tolerance.     PT Home Exercise Plan Supine Active Straight Leg Raise - 2-3 x daily - 7 x weekly - 2 sets - 10 reps  Seated Knee Extension AROM - 2-3 x daily - 7 x weekly - 2 sets - 12 reps  Standing Knee Flexion - 2-3 x daily - 7 x weekly - 2 sets - 10 reps  Sidelying Hip Abduction - 2-3 x daily - 7 x weekly - 2 sets - 10 reps  Ankle Pumps in Elevation - 2-3 x daily - 7 x weekly - 20 reps    Consulted and Agree with Plan of Care Patient             Patient will benefit from skilled therapeutic intervention in order to improve the following deficits and impairments:  Abnormal gait, Difficulty walking, Decreased range of motion, Pain, Decreased strength, Decreased activity tolerance, Decreased knowledge of use of DME  Visit Diagnosis: Chronic pain of both knees  Difficulty in walking, not elsewhere classified  Impaired mobility and ADLs     Problem List Patient Active Problem List   Diagnosis Date Noted   Chronic pain of both knees 10/22/2021   Hematuria 10/01/2021   Recurrent UTI 10/01/2021   Dyspnea on exertion 10/01/2021   Iron deficiency anemia 04/18/2021   Nocturnal sleep-related eating disorder 01/01/2021   Vitamin D deficiency 01/01/2021   Arthritis of left knee 12/26/2020   2019 novel coronavirus-infected pneumonia ([facility]) 12/03/2020   S/P left knee arthroscopy 11/14/20  11/21/2020   Old complex tear of medial meniscus of left knee    Primary osteoarthritis of left knee 11/09/2020   Subacromial bursitis of right shoulder joint 11/09/2020   Chronic pain syndrome 04/24/2020   Pharmacologic therapy 04/24/2020   Disorder of skeletal system 04/24/2020   Problems influencing health status 04/24/2020   History of thrombocytopenia 04/24/2020   Abnormal MRI, cervical spine (01/08/2017) 04/24/2020   Abnormal MRI, lumbar spine (05/11/2014) 04/24/2020   DDD (degenerative disc disease), cervical 04/24/2020   DDD (degenerative disc disease), lumbar 04/24/2020   Failed back surgical syndrome  04/24/2020   Diabetic peripheral neuropathy ([place]) 04/24/2020   Neurogenic pain 04/24/2020   Chronic musculoskeletal pain 04/24/2020   Other proteinuria 03/30/2020   Myalgia 03/15/2020   Bilateral hand swelling 02/16/2020   Thrush 01/20/2020   Morbid obesity ([place]) 10/25/2019   History of back surgery 10/25/2019   Peripheral neuropathy 10/25/2019   Restless leg syndrome 10/25/2019   Chronic low back pain (Bilateral) w/ sciatica (Bilateral) 10/25/2019   Hypertension associated with diabetes ([place]) 10/25/2019   Type 2 diabetes mellitus with stage 3 chronic kidney disease, without long-term current use of insulin ([place]) 10/25/2019   Chronic anticoagulation (Coumadin) 10/25/2019   Hyperlipidemia associated with type 2 diabetes mellitus ([place]) 10/25/2019   CKD stage 3 due to type 2 diabetes mellitus ([place]) 10/25/2019   GERD (gastroesophageal reflux disease) 10/25/2019   Allergic rhinitis 10/25/2019   Chronic constipation 10/25/2019   Hypothyroidism    Normocytic anemia 09/15/2019   Iliac vein stenosis, 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   Chronic venous insufficiency 11/21/2017   Displacement of lumbar intervertebral disc without myelopathy 07/14/2017  Radicular low back pain 05/25/2014   Depression, major, recurrent ([place]) 06/17/2013   OCD (obsessive compulsive disorder) 11/20/2012   Insomnia due to mental disorder 09/25/2012   Bipolar 1 disorder ([place]) 09/25/2012   History of DVT (deep vein thrombosis) 08/18/2012   Ihor Austin, LPTA/CLT; CBIS [phone]  Aldona Lento, PTA 12/28/2021, 5:13 PM  [facility] [address] [place], [place], [address] Phone: [phone]   Fax:  [phone]  Name: Jane [name] MRN: [id number] Date of Birth: 04-15-1966

## 2021-12-31 ENCOUNTER — Encounter: Payer: Self-pay | Admitting: Student in an Organized Health Care Education/Training Program

## 2021-12-31 ENCOUNTER — Ambulatory Visit (INDEPENDENT_AMBULATORY_CARE_PROVIDER_SITE_OTHER): Payer: Medicaid Other | Admitting: Family Medicine

## 2021-12-31 ENCOUNTER — Encounter (INDEPENDENT_AMBULATORY_CARE_PROVIDER_SITE_OTHER): Payer: Self-pay | Admitting: Family Medicine

## 2021-12-31 ENCOUNTER — Other Ambulatory Visit: Payer: Self-pay

## 2021-12-31 ENCOUNTER — Telehealth: Payer: Self-pay | Admitting: Student in an Organized Health Care Education/Training Program

## 2021-12-31 ENCOUNTER — Other Ambulatory Visit (HOSPITAL_COMMUNITY): Payer: Self-pay

## 2021-12-31 ENCOUNTER — Ambulatory Visit
Payer: Medicaid Other | Attending: Student in an Organized Health Care Education/Training Program | Admitting: Student in an Organized Health Care Education/Training Program

## 2021-12-31 VITALS — BP 114/62 | HR 92 | Temp 97.9°F | Resp 16 | Ht 70.0 in | Wt 263.0 lb

## 2021-12-31 VITALS — BP 134/79 | HR 71 | Temp 97.5°F | Ht 70.0 in | Wt 263.0 lb

## 2021-12-31 DIAGNOSIS — I871 Compression of vein: Secondary | ICD-10-CM | POA: Diagnosis not present

## 2021-12-31 DIAGNOSIS — E1165 Type 2 diabetes mellitus with hyperglycemia: Secondary | ICD-10-CM

## 2021-12-31 DIAGNOSIS — G894 Chronic pain syndrome: Secondary | ICD-10-CM | POA: Insufficient documentation

## 2021-12-31 DIAGNOSIS — E669 Obesity, unspecified: Secondary | ICD-10-CM

## 2021-12-31 DIAGNOSIS — G8929 Other chronic pain: Secondary | ICD-10-CM | POA: Diagnosis not present

## 2021-12-31 DIAGNOSIS — M5030 Other cervical disc degeneration, unspecified cervical region: Secondary | ICD-10-CM | POA: Diagnosis not present

## 2021-12-31 DIAGNOSIS — Z7901 Long term (current) use of anticoagulants: Secondary | ICD-10-CM | POA: Insufficient documentation

## 2021-12-31 DIAGNOSIS — Z7985 Long-term (current) use of injectable non-insulin antidiabetic drugs: Secondary | ICD-10-CM

## 2021-12-31 DIAGNOSIS — E1169 Type 2 diabetes mellitus with other specified complication: Secondary | ICD-10-CM

## 2021-12-31 DIAGNOSIS — M5136 Other intervertebral disc degeneration, lumbar region: Secondary | ICD-10-CM | POA: Insufficient documentation

## 2021-12-31 DIAGNOSIS — M792 Neuralgia and neuritis, unspecified: Secondary | ICD-10-CM | POA: Diagnosis not present

## 2021-12-31 DIAGNOSIS — R937 Abnormal findings on diagnostic imaging of other parts of musculoskeletal system: Secondary | ICD-10-CM | POA: Insufficient documentation

## 2021-12-31 DIAGNOSIS — Z0289 Encounter for other administrative examinations: Secondary | ICD-10-CM | POA: Insufficient documentation

## 2021-12-31 DIAGNOSIS — E785 Hyperlipidemia, unspecified: Secondary | ICD-10-CM | POA: Diagnosis not present

## 2021-12-31 DIAGNOSIS — Z79899 Other long term (current) drug therapy: Secondary | ICD-10-CM | POA: Diagnosis not present

## 2021-12-31 DIAGNOSIS — M7918 Myalgia, other site: Secondary | ICD-10-CM | POA: Diagnosis not present

## 2021-12-31 DIAGNOSIS — Z6837 Body mass index (BMI) 37.0-37.9, adult: Secondary | ICD-10-CM | POA: Diagnosis not present

## 2021-12-31 DIAGNOSIS — E1142 Type 2 diabetes mellitus with diabetic polyneuropathy: Secondary | ICD-10-CM | POA: Insufficient documentation

## 2021-12-31 DIAGNOSIS — M961 Postlaminectomy syndrome, not elsewhere classified: Secondary | ICD-10-CM | POA: Diagnosis not present

## 2021-12-31 DIAGNOSIS — Z6841 Body Mass Index (BMI) 40.0 and over, adult: Secondary | ICD-10-CM

## 2021-12-31 DIAGNOSIS — M1712 Unilateral primary osteoarthritis, left knee: Secondary | ICD-10-CM | POA: Insufficient documentation

## 2021-12-31 MED ORDER — HYDROCODONE-ACETAMINOPHEN 10-325 MG PO TABS: 1.0000 | ORAL_TABLET | Freq: Four times a day (QID) | ORAL | 0 refills | Status: AC | PRN

## 2021-12-31 MED ORDER — HYDROCODONE-ACETAMINOPHEN 10-325 MG PO TABS: 1.0000 | ORAL_TABLET | Freq: Four times a day (QID) | ORAL | 0 refills | Status: DC | PRN

## 2021-12-31 MED ORDER — OZEMPIC (0.25 OR 0.5 MG/DOSE) 2 MG/1.5ML ~~LOC~~ SOPN
0.2500 mg | PEN_INJECTOR | SUBCUTANEOUS | 0 refills | Status: DC
  Filled 2021-12-31 – 2022-01-18 (×6): qty 1.5, 56d supply, fill #0

## 2021-12-31 NOTE — Telephone Encounter (Signed)
Patient in for F2F with BL

## 2021-12-31 NOTE — Progress Notes (Signed)
Chief Complaint:   OBESITY Jane [name] is here to discuss her progress with her obesity treatment plan along with follow-up of her obesity related diagnoses. [name] is on the Category 4 Plan and states she is following her eating plan approximately 75% of the time. [name] states she is exercising and doing PT 15-45 minutes 2-7 times per week.  Today's visit was #: 24 Starting weight: 283 lbs Starting date: 03/29/2020 Today's weight: 263 lbs Today's date: 12/31/2021 Total lbs lost to date: 20 Total lbs lost since last in-office visit: 4  Interim History: Pt is feeling motivated with additional 4 lb weight loss. The hardest part is eating on plan and controlling indulgent off plan eating. For snack calories, pt is eating popcorn, PB crackers, and 100 calorie ice cream. She is going to PT B[facility] and signing up for water aerobics March 1.  Subjective:   1. Type 2 diabetes mellitus with hyperglycemia, without long-term current use of insulin (HCC) Jane [name] is doing well on 0.25 mg Ozempic. Her last [name] was 6.2.  2. Hyperlipidemia associated with type 2 diabetes mellitus (HCC) Pt is on Lipitor 40 mg with no myalgias.  Assessment/Plan:   1. Type 2 diabetes mellitus with hyperglycemia, without long-term current use of insulin (HCC) Good blood sugar control is important to decrease the likelihood of diabetic complications such as nephropathy, neuropathy, limb loss, blindness, coronary artery disease, and death. Intensive lifestyle modification including diet, exercise and weight loss are the first line of treatment for diabetes.   Refill- Semaglutide,0.25 or 0.5MG /DOS, (OZEMPIC, 0.25 OR 0.5 MG/DOSE,) 2 MG/1.5ML SOPN; Inject 0.25 mg into the skin once a week.  Dispense: 1.5 mL; Refill: 0  2. Hyperlipidemia associated with type 2 diabetes mellitus ([place]) Cardiovascular risk and specific lipid/LDL goals reviewed.  We discussed several lifestyle modifications today and Jane [name] will continue to work on diet,  exercise and weight loss efforts. Orders and follow up as documented in patient record. Continue Lipitor. Repeat labs in May/June.  Counseling Intensive lifestyle modifications are the first line treatment for this issue. Dietary changes: Increase soluble fiber. Decrease simple carbohydrates. Exercise changes: Moderate to vigorous-intensity aerobic activity 150 minutes per week if tolerated. Lipid-lowering medications: see documented in medical record.  3. Obesity with current BMI of 37.8 Jane [name] is currently in the action stage of change. As such, her goal is to continue with weight loss efforts. She has agreed to the Category 4 Plan.   Exercise goals: All adults should avoid inactivity. Some physical activity is better than none, and adults who participate in any amount of physical activity gain some health benefits.  Behavioral modification strategies: increasing lean protein intake, meal planning and cooking strategies, keeping healthy foods in the home, dealing with family or coworker sabotage, and planning for success.  Jane [name] has agreed to follow-up with our clinic in 3-4 weeks. She was informed of the importance of frequent follow-up visits to maximize her success with intensive lifestyle modifications for her multiple health conditions.   Objective:   Blood pressure 134/79, pulse 71, temperature (!) 97.5 F (36.4 C), height 5\' 10"  (1.778 m), weight 263 lb (119.3 kg), SpO2 97 %. Body mass index is 37.74 kg/m.  General: Cooperative, alert, well developed, in no acute distress. HEENT: Conjunctivae and lids unremarkable. Cardiovascular: Regular rhythm.  Lungs: Normal work of breathing. Neurologic: No focal deficits.   Lab Results  Component Value Date   CREATININE 1.23 (H) 04/12/2021   BUN 33 (H) 04/12/2021   NA 140  04/12/2021   K 3.8 04/12/2021   CL 101 04/12/2021   CO2 32 04/12/2021   Lab Results  Component Value Date   ALT 20 04/12/2021   AST 17 04/12/2021   ALKPHOS 95  04/12/2021   BILITOT 0.6 04/12/2021   Lab Results  Component Value Date   HGBA1C 6.2 (H) 11/06/2021   HGBA1C 6.3 (H) 03/22/2021   HGBA1C 5.8 (A) 10/24/2020   HGBA1C 6.2 (H) 03/29/2020   HGBA1C 6.3 (H) 10/25/2019   Lab Results  Component Value Date   INSULIN 21.5 03/29/2020   Lab Results  Component Value Date   TSH 0.107 (L) 05/18/2020   Lab Results  Component Value Date   CHOL 191 11/06/2021   HDL 38 (L) 11/06/2021   LDLCALC 116 (H) 11/06/2021   TRIG 187 (H) 11/06/2021   CHOLHDL 5.0 11/06/2021   Lab Results  Component Value Date   VD25OH 50.60 01/01/2021   VD25OH 45.30 10/17/2020   VD25OH 37.20 07/18/2020   Lab Results  Component Value Date   WBC 9.3 08/16/2021   HGB 15.2 (H) 08/16/2021   HCT 45.3 08/16/2021   MCV 93.8 08/16/2021   PLT 156 08/16/2021   Lab Results  Component Value Date   IRON 93 08/16/2021   TIBC 348 08/16/2021   FERRITIN 137 08/16/2021   Attestation Statements:   Reviewed by clinician on day of visit: allergies, medications, problem list, medical history, surgical history, family history, social history, and previous encounter notes.  Coral Ceo, CMA, am acting as transcriptionist for Coralie Common, MD.  I have reviewed the above documentation for accuracy and completeness, and I agree with the above. - Coralie Common, MD

## 2021-12-31 NOTE — Progress Notes (Signed)
PROVIDER NOTE: Information contained herein reflects review and annotations entered in association with encounter. Interpretation of such information and data should be left to medically-trained personnel. Information provided to patient can be located elsewhere in the medical record under "Patient Instructions". Document created using STT-dictation technology, any transcriptional errors that may result from process are unintentional.    Patient: Jane [name]  Service Category: E/M  Provider: Gillis Santa, MD  DOB: [birth year]  DOS: 12/31/2021  Specialty: Interventional Pain Management  MRN: [id number]  Setting: Ambulatory outpatient  PCP: [name], MD  Type: Established Patient    Referring Provider: [name], MD  Location: Office  Delivery: Face-to-face     HPI  [name], a 56 y.o. year old female, is here today because of her Chronic pain syndrome [G89.4]. [name] primary complain today is Back Pain (Lumbar worse on the left ), Knee Pain (Bilateral ), and Foot Pain (Left )  Last encounter: My last encounter with her was on 10/09/2021  Pertinent problems: [name] has History of DVT (deep vein thrombosis); Bipolar 1 disorder ([place]); Depression, major, recurrent ([place]); Radicular low back pain; May-Thurner syndrome; Iliac vein stenosis, left; Morbid obesity ([place]); Peripheral neuropathy; Chronic low back pain (Bilateral) w/ sciatica (Bilateral); Chronic anticoagulation (Coumadin); CKD stage 3 due to type 2 diabetes mellitus ([place]); Displacement of lumbar intervertebral disc without myelopathy; Chronic pain syndrome; Pharmacologic therapy; Abnormal MRI, cervical spine (01/08/2017); Abnormal MRI, lumbar spine (05/11/2014); DDD (degenerative disc disease), cervical; DDD (degenerative disc disease), lumbar; Failed back surgical syndrome; Diabetic peripheral neuropathy ([place]); Neurogenic pain; and Chronic musculoskeletal pain on their pertinent problem list. Pain  Assessment: Severity of Chronic pain is reported as a 7 /10. Location: Back (see visit info for additional pain sites.) Lower, Left/back pain into left butt cheek and left leg. Onset: More than a month ago. Quality: Discomfort, Constant. Timing: Constant. Modifying factor(s): nothing currently other than position changes. Vitals:  height is $RemoveB'5\' 10"'jSprAMOV$  (1.778 m) and weight is 263 lb (119.3 kg). Her temporal temperature is 97.9 F (36.6 C). Her blood pressure is 114/62 and her pulse is 92. Her respiration is 16 and oxygen saturation is 100%.   Reason for encounter: medication management.    Patient changed insurance and unfortunately pharmacy only filled a 7-day prescription with her last fill.  She follows up today for medication management. Patient continues multimodal pain regimen as prescribed.  States that it provides pain relief and improvement in functional status.   Pharmacotherapy Assessment    Analgesic: Hydrocodone 10 mg 4 times daily as needed, quantity 120/month; MME equals 40   Monitoring: [place] PMP: PDMP reviewed during this encounter.       Pharmacotherapy: No side-effects or adverse reactions reported. Compliance: No problems identified. Effectiveness: Clinically acceptable.  UDS:  Summary  Date Value Ref Range Status  04/10/2021 Note  Final    Comment:    ==================================================================== [employer] [facility] (MW) ==================================================================== Test                             Result       Flag       Units  Drug Present and Declared for Prescription Verification   Amphetamine                    6176         EXPECTED   ng/mg creat    Amphetamine is available as a  schedule II prescription drug.    Hydrocodone                    2716         EXPECTED   ng/mg creat   Hydromorphone                  62           EXPECTED   ng/mg creat   Dihydrocodeine                 311          EXPECTED   ng/mg creat    Norhydrocodone                 1278         EXPECTED   ng/mg creat    Sources of hydrocodone include scheduled prescription medications.    Hydromorphone, dihydrocodeine and norhydrocodone are expected    metabolites of hydrocodone. Hydromorphone and dihydrocodeine are    also available as scheduled prescription medications.  Drug Absent but Declared for Prescription Verification   Alprazolam                     Not Detected UNEXPECTED ng/mg creat ==================================================================== Test                      Result    Flag   Units      Ref Range   Creatinine              82               mg/dL      >=20 ==================================================================== Declared Medications:  The flagging and interpretation on this report are based on the  following declared medications.  Unexpected results may arise from  inaccuracies in the declared medications.   **Note: The testing scope of this panel includes these medications:   Alprazolam (Xanax)  Amphetamine (Adderall)  Hydrocodone (Norco)   **Note: The testing scope of this panel does not include the  following reported medications:   Acetaminophen (Norco)  Aspirin  Atorvastatin (Lipitor)  Cetirizine (Zyrtec)  Eye Drops  Fluoxetine (Prozac)  Hydrochlorothiazide (Hydrodiuril)  Linaclotide (Linzess)  Methocarbamol (Robaxin)  Multivitamin  Omega-3 Fatty Acids  Pantoprazole (Protonix)  Potassium (Klor-Con)  Pregabalin (Lyrica)  Ropinirole (Requip)  Supplement  Warfarin (Coumadin) ==================================================================== For clinical consultation, please call [phone]. ====================================================================       ROS  Constitutional: Denies any fever or chills Gastrointestinal: No reported hemesis, hematochezia, vomiting, or acute GI distress Musculoskeletal: bilateral leg pain, bilateral thigh calf pain, right  greater than left, bilateral foot pain Neurological: No reported episodes of acute onset apraxia, aphasia, dysarthria, agnosia, amnesia, paralysis, loss of coordination, or loss of consciousness  Medication Review  ALPRAZolam, FLUoxetine, Garlic, HYDROcodone-acetaminophen, Olopatadine HCl, Omega-3 Fatty Acids, Semaglutide(0.25 or 0.5MG /DOS), Vibegron, amLODipine, amitriptyline, amphetamine-dextroamphetamine, aspirin EC, atorvastatin, capsaicin, cefpodoxime, cetirizine, conjugated estrogens, cycloSPORINE, hydrochlorothiazide, levothyroxine, linaclotide, lisinopril, methocarbamol, multivitamin, pantoprazole, potassium chloride, pregabalin, rOPINIRole, and warfarin  History Review  Allergy: [name] is allergic to aripiprazole, seroquel [quetiapine fumarate], chlorpromazine, and gabapentin. Drug: [name]  reports no history of drug use. Alcohol:  reports no history of alcohol use. Tobacco:  reports that she has never smoked. She has never used smokeless tobacco. Social: [name]  reports that she has never smoked. She has never used smokeless tobacco. She reports that she does not drink alcohol and  does not use drugs. Medical:  has a past medical history of ADD (attention deficit disorder), Anxiety, Back pain, Bilateral swelling of feet, Bipolar 1 disorder ([place]), Bipolar 1 disorder ([place]), Bipolar disorder ([place]), Chewing difficulty, Chronic fatigue syndrome, Chronic kidney disease, Constipation, Depression, Diabetes mellitus without complication ([place]), Dyspnea, GERD (gastroesophageal reflux disease), Headache, High cholesterol, History of blood clots, History of DVT (deep vein thrombosis), Hypertension, Hypothyroidism, Joint pain, Neuropathy, Obsessive-compulsive disorder, Peripheral vascular disease ([place]), Prediabetes, Respiratory failure requiring intubation ([place]), Restless leg syndrome, Shortness of breath, Sleep apnea, Thrombocytopenia ([place]) (09/15/2019), Trigger thumb of left hand  (01/2018), and Trigger thumb of right hand. Surgical: [name]  has a past surgical history that includes Cholecystectomy; Trigger finger release (Right, 12/01/2017); Abdominal hysterectomy (06/2016); Lumbar fusion (11/21/2000); Lumbar spine surgery; Trigger finger release (Left, 01/26/2018); LOWER EXTREMITY VENOGRAPHY (N/A, 08/17/2018); Femoral-femoral Bypass Graft (Left, 11/24/2018); AV fistula placement (Left, 11/24/2018); LOWER EXTREMITY VENOGRAPHY (Bilateral, 03/09/2019); Patch angioplasty (Left, 03/30/2019); Ultrasound guidance for vascular access (Right, 03/30/2019); Femoral artery debridement (03/30/2019); Insertion of iliac stent (03/30/2019); Groin debridement (Left, 04/27/2019); Application if wound vac (Left, 04/27/2019); LOWER EXTREMITY VENOGRAPHY (Left, 08/16/2019); PERIPHERAL VASCULAR INTERVENTION (Left, 08/16/2019); Back surgery; Colonoscopy with propofol (N/A, 11/22/2019); and Knee arthroscopy with meniscal repair (Left, 11/14/2020). Family: family history includes ADD / ADHD in her daughter; Anxiety disorder in her daughter; Bipolar disorder in her daughter, maternal aunt, and mother; Depression in her mother; Diabetes in her father; Drug abuse in her daughter and daughter; Heart disease in her brother, father, and mother; Hyperlipidemia in her brother, father, and mother; Hypertension in her brother, father, mother, and sister; Obesity in her father and mother; Sleep apnea in her father and mother; Stroke in her mother; Suicidality in her maternal aunt.  Laboratory Chemistry Profile   Renal Lab Results  Component Value Date   BUN 33 (H) 04/12/2021   CREATININE 1.23 (H) 04/12/2021   BCR 21 03/22/2021   GFRAA 53 (L) 07/18/2020   GFRNONAA 52 (L) 04/12/2021     Hepatic Lab Results  Component Value Date   AST 17 04/12/2021   ALT 20 04/12/2021   ALBUMIN 3.8 04/12/2021   ALKPHOS 95 04/12/2021   HCVAB NON REACTIVE 11/06/2021     Electrolytes Lab Results  Component Value Date   NA 140  04/12/2021   K 3.8 04/12/2021   CL 101 04/12/2021   CALCIUM 9.5 04/12/2021   MG 2.2 04/04/2019   PHOS 4.0 04/04/2019     Bone Lab Results  Component Value Date   VD25OH 50.60 01/01/2021     Inflammation (CRP: Acute Phase) (ESR: Chronic Phase) Lab Results  Component Value Date   CRP 11 (H) 02/16/2020   ESRSEDRATE 33 02/16/2020   LATICACIDVEN 1.2 01/05/2020       Note: Above Lab results reviewed.  Recent Imaging Review  VAS [place] IVC/ILIAC (VENOUS ONLY) IVC/ILIAC STUDY  Patient Name:  JOSSLIN SANJUAN  Date of Exam:   11/07/2021 Medical Rec #: [id number]        Accession #:    [id number] Date of Birth: 10-Nov-1965        Patient Gender: F Patient Age:   23 years Exam Location:  [facility] Procedure:      VAS [place] IVC/ILIAC (VENOUS ONLY) Referring Phys: Servando Snare  --------------------------------------------------------------------------------   Indications: Surgery date 08/16/2019.  Risk Factors: Hyperlipidemia, Diabetes, no history of smoking.  Other Factors: Patient complains of bilateral knee pain.  Vascular Interventions: 08/16/2019 Balloon angioplasty of the left common  iliac/external iliac vein                         stent stenosis.                         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 Stenting of the                         IVC through the CFV.  Limitations: Obesity, patient discomfort and air/bowel gas.    Comparison Study: Prior duplex 11/10/2020 suggested patent left common iliac                   stent with stenosis at the distal common femoral segment.  Performing Technologist: Delorise Shiner RVT    Examination Guidelines: A complete evaluation includes B-mode imaging, spectral Doppler, color Doppler, and power Doppler as needed of all accessible portions of each vessel. Bilateral testing is considered an integral part of a complete examination. Limited  examinations for reoccurring indications may be performed as noted.    IVC/Iliac Findings: +----------+------+--------+--------+     IVC     Patent Thrombus Comments  +----------+------+--------+--------+  IVC Prox   patent                    +----------+------+--------+--------+  IVC Mid    patent                    +----------+------+--------+--------+  IVC Distal patent                    +----------+------+--------+--------+    +-------------------+---------+-----------+---------+-----------+--------+          CIV         RT-Patent RT-Thrombus LT-Patent LT-Thrombus Comments  +-------------------+---------+-----------+---------+-----------+--------+  Common Iliac Prox    patent                patent                stent    +-------------------+---------+-----------+---------+-----------+--------+  Common Iliac Mid     patent                patent                stent    +-------------------+---------+-----------+---------+-----------+--------+  Common Iliac Distal  patent                patent                stent    +-------------------+---------+-----------+---------+-----------+--------+     +-------------------------+---------+-----------+---------+-----------+--------+             EIV            RT-Patent RT-Thrombus LT-Patent LT-Thrombus Comments  +-------------------------+---------+-----------+---------+-----------+--------+  External Iliac Vein Prox                         patent                stent    +-------------------------+---------+-----------+---------+-----------+--------+  External Iliac Vein Mid                          patent  stent    +-------------------------+---------+-----------+---------+-----------+--------+  External Iliac Vein                              patent                stent     Distal                                                                           +-------------------------+---------+-----------+---------+-----------+--------+    No obvious stenosis noted at the distal end of the stent.     Summary: IVC/Iliac: No evidence of thrombus in IVC and Iliac veins. There is no evidence of thrombus involving the IVC. There is no evidence of thrombus involving the right common iliac vein and left common iliac vein. There is no evidence of thrombus involving  the left external iliac vein.   *See table(s) above for measurements and observations.   Electronically signed by Servando Snare MD on 11/07/2021 at 10:05:32 AM.       Final    Note: Reviewed        Physical Exam  General appearance: Well nourished, well developed, and well hydrated. In no apparent acute distress Mental status: Alert, oriented x 3 (person, place, & time)       Respiratory: No evidence of acute respiratory distress Eyes: PERLA Vitals: BP 114/62 (BP Location: Right Arm, Patient Position: Sitting, Cuff Size: Large)    Pulse 92    Temp 97.9 F (36.6 C) (Temporal)    Resp 16    Ht $R'5\' 10"'WC$  (1.778 m)    Wt 263 lb (119.3 kg)    LMP  (LMP Unknown)    SpO2 100%    BMI 37.74 kg/m  BMI: Estimated body mass index is 37.74 kg/m as calculated from the following:   Height as of this encounter: $RemoveBeforeD'5\' 10"'GMfpeGHjNOelLu$  (1.778 m).   Weight as of this encounter: 263 lb (119.3 kg). Ideal: Ideal body weight: 68.5 kg (151 lb 0.2 oz) Adjusted ideal body weight: 88.8 kg (195 lb 12.9 oz)  Lumbar Spine Area Exam  Skin & Axial Inspection: Well healed scar from previous spine surgery detected Alignment: Asymmetric Functional ROM: Decreased ROM       Stability: No instability detected Muscle Tone/Strength: Functionally intact. No obvious neuro-muscular anomalies detected. Sensory (Neurological): Dermatomal pain pattern   Provocative Tests: Hyperextension/rotation test: (+) bilaterally for facet joint pain. Lumbar quadrant test (Kemp's test): (+) bilaterally for facet joint pain.     Gait & Posture  Assessment  Ambulation: Limited Gait: Antalgic gait (limping) Posture: Difficulty standing up straight, due to pain  Lower Extremity Exam      Side: Right lower extremity   Side: Left lower extremity  Stability: No instability observed           Stability: No instability observed          Skin & Extremity Inspection: Skin color, temperature, and hair growth are WNL. No peripheral edema or cyanosis. No masses, redness, swelling, asymmetry, or associated skin lesions. No contractures.   Skin & Extremity Inspection: Skin color, temperature, and hair growth are WNL. No peripheral edema or cyanosis. No masses, redness,  swelling, asymmetry, or associated skin lesions. No contractures.  Functional ROM: Decreased ROM for hip and knee joints           Functional ROM: Decreased ROM for hip and knee joints, pain with weight bearing and lateral rotation          Muscle Tone/Strength: Functionally intact. No obvious neuro-muscular anomalies detected.   Muscle Tone/Strength: Functionally intact. No obvious neuro-muscular anomalies detected.  Sensory (Neurological): Neurogenic pain pattern        Sensory (Neurological):  Arthropathic arthralgia for left knee severe pain with weightbearing        DTR: Patellar: deferred today Achilles: deferred today Plantar: deferred today   DTR: Patellar: deferred today Achilles: deferred today Plantar: deferred today  Palpation: No palpable anomalies   Palpation: No palpable anomalies    Assessment   Status Diagnosis  Controlled Controlled Controlled 1. Chronic pain syndrome   2. Failed back surgical syndrome   3. Neurogenic pain   4. DDD (degenerative disc disease), lumbar   5. Pain management contract signed   6. Arthritis of left knee   7. May-Thurner syndrome   8. DDD (degenerative disc disease), cervical   9. Diabetic peripheral neuropathy ([place])   10. Chronic musculoskeletal pain   11. Chronic anticoagulation (Coumadin)   12. Abnormal MRI, cervical  spine (01/08/2017)   13. Abnormal MRI, lumbar spine (05/11/2014)   14. Pharmacologic therapy        Plan of Care  [name] has a current medication list which includes the following long-term medication(s): amitriptyline, amlodipine, amphetamine-dextroamphetamine, amphetamine-dextroamphetamine, amphetamine-dextroamphetamine, atorvastatin, cetirizine, fluoxetine, fluoxetine, hydrochlorothiazide, levothyroxine, linzess, lisinopril, pantoprazole, potassium chloride, ropinirole, and warfarin.  Pharmacotherapy (Medications Ordered): Meds ordered this encounter  Medications   HYDROcodone-acetaminophen (NORCO) 10-325 MG tablet    Sig: Take 1 tablet by mouth every 6 (six) hours as needed. To last 30 days from fill date    Dispense:  120 tablet    Refill:  0   HYDROcodone-acetaminophen (NORCO) 10-325 MG tablet    Sig: Take 1 tablet by mouth every 6 (six) hours as needed. To last 30 days from fill date    Dispense:  120 tablet    Refill:  0   HYDROcodone-acetaminophen (NORCO) 10-325 MG tablet    Sig: Take 1 tablet by mouth every 6 (six) hours as needed. To last 30 days from fill date    Dispense:  120 tablet    Refill:  0   Qutenza, capsaicin 8% topical treatment for painful diabetic neuropathy of bilateral feet denied by Medicaid Continue with psychiatric care Continue with multimodal analgesics including Lyrica, Robaxin as needed   No orders of the defined types were placed in this encounter.     Follow-up plan:   Return in about 3 months (around 03/30/2022) for Medication Management, in person.   Recent Visits Date Type Provider Dept  10/09/21 Office Visit Gillis Santa, MD [facility]  Showing recent visits within past 90 days and meeting all other requirements Today's Visits Date Type Provider Dept  12/31/21 Office Visit Gillis Santa, MD [facility]  Showing today's visits and meeting all other requirements Future Appointments Date Type  Provider Dept  03/26/22 Appointment Gillis Santa, MD [facility]  Showing future appointments within next 90 days and meeting all other requirements  I discussed the assessment and treatment plan with the patient. The patient was provided an opportunity to ask questions and all were answered. The patient agreed with the plan  and demonstrated an understanding of the instructions.  Patient advised to call back or seek an in-person evaluation if the symptoms or condition worsens.  Duration of encounter: 57minutes.  Note by: Gillis Santa, MD Date: 12/31/2021; Time: 1:48 PM

## 2021-12-31 NOTE — Progress Notes (Signed)
Safety precautions to be maintained throughout the outpatient stay will include: orient to surroundings, keep bed in low position, maintain call bell within reach at all times, provide assistance with transfer out of bed and ambulation.  

## 2022-01-01 ENCOUNTER — Ambulatory Visit (HOSPITAL_COMMUNITY): Payer: Medicaid Other | Admitting: Physical Therapy

## 2022-01-01 ENCOUNTER — Ambulatory Visit: Payer: Medicaid Other | Admitting: Urology

## 2022-01-01 ENCOUNTER — Other Ambulatory Visit (HOSPITAL_COMMUNITY): Payer: Self-pay | Admitting: Psychiatry

## 2022-01-01 ENCOUNTER — Telehealth (HOSPITAL_COMMUNITY): Payer: Self-pay | Admitting: *Deleted

## 2022-01-01 DIAGNOSIS — Z7409 Other reduced mobility: Secondary | ICD-10-CM | POA: Diagnosis not present

## 2022-01-01 DIAGNOSIS — R262 Difficulty in walking, not elsewhere classified: Secondary | ICD-10-CM | POA: Diagnosis not present

## 2022-01-01 DIAGNOSIS — M25561 Pain in right knee: Secondary | ICD-10-CM | POA: Diagnosis not present

## 2022-01-01 DIAGNOSIS — M25562 Pain in left knee: Secondary | ICD-10-CM | POA: Diagnosis not present

## 2022-01-01 DIAGNOSIS — G8929 Other chronic pain: Secondary | ICD-10-CM | POA: Diagnosis not present

## 2022-01-01 DIAGNOSIS — Z789 Other specified health status: Secondary | ICD-10-CM | POA: Diagnosis not present

## 2022-01-01 NOTE — Patient Instructions (Signed)
Access Code: [email or web address] URL: [email or web address] Date: 01/01/2022 Prepared by: Josue Hector  Exercises Standing Bilateral Gastroc Stretch with Step - 2-3 x daily - 7 x weekly - 1 sets - 3 reps - 30 second hold

## 2022-01-01 NOTE — Therapy (Signed)
[place], [place], [address] Phone: [phone]   Fax:  [phone]  Physical Therapy Treatment  Patient Details  Name: Jane [name] MRN: [id number] Date of Birth: [birth year] Referring Provider (PT): Lovell Sheehan, MD   Encounter Date: 01/01/2022   PT End of Session - 01/01/22 0821     Visit Number 3    Number of Visits 10    Date for PT Re-Evaluation 01/28/22    Authorization Type [place] Medicaid Healthy*    Authorization Time Period 10 visits approved 2/20-3/27/23    Authorization - Visit Number 3    Authorization - Number of Visits 10    Progress Note Due on Visit 10    PT Start Time 0817    PT Stop Time 0855    PT Time Calculation (min) 38 min    Activity Tolerance Patient tolerated treatment well    Behavior During Therapy [facility] for tasks assessed/performed             Past Medical History:  Diagnosis Date   ADD (attention deficit disorder)    Anxiety    Back pain    Bilateral swelling of feet    Bipolar 1 disorder (HCC)    Bipolar 1 disorder (HCC)    Bipolar disorder (HCC)    Chewing difficulty    Chronic fatigue syndrome    Chronic kidney disease    Stage 3 kidney disease;dx by Dr. Sinda Du.    Constipation    Depression    Diabetes mellitus without complication (HCC)    diet controlled   Dyspnea    with exertion   GERD (gastroesophageal reflux disease)    Headache    migraines   High cholesterol    History of blood clots    History of DVT (deep vein thrombosis)    left leg   Hypertension    states under control with meds., has been on med. x 2 years   Hypothyroidism    Joint pain    Neuropathy    Obsessive-compulsive disorder    Peripheral vascular disease ([place])    Prediabetes    Respiratory failure requiring intubation ([place])    Restless leg syndrome    Shortness of breath    Sleep apnea    Thrombocytopenia ([place]) 09/15/2019   Trigger thumb of left hand 01/2018    Trigger thumb of right hand     Past Surgical History:  Procedure Laterality Date   ABDOMINAL HYSTERECTOMY  06/2016   complete   APPLICATION OF WOUND VAC Left 04/27/2019   Procedure: APPLICATION OF WOUND VAC LEFT GROIN;  Surgeon: Waynetta Sandy, MD;  Location: [place];  Service: Vascular;  Laterality: Left;   AV FISTULA PLACEMENT Left 11/24/2018   Procedure: ARTERIOVENOUS (AV) FISTULA CREATION LEFT SFA TO LEFT FEMORAL VEIN;  Surgeon: Waynetta Sandy, MD;  Location: [place];  Service: Vascular;  Laterality: Left;   BACK SURGERY     CHOLECYSTECTOMY     COLONOSCOPY WITH PROPOFOL N/A 11/22/2019   Procedure: COLONOSCOPY WITH PROPOFOL;  Surgeon: [name], MD;  [facility] END[facility];  Service: Gastroenterology;  Laterality: N/A;   FEMORAL ARTERY EXPLORATION  03/30/2019   Procedure: Left Common Femoral Artery and Vein Exploration;  Surgeon: Waynetta Sandy, MD;  Location: [place];  Service: Vascular;;   FEMORAL-FEMORAL BYPASS GRAFT Left 11/24/2018   Procedure: BYPASS GRAFT FEMORAL-FEMORAL VENOUS LEFT TO RIGHT PALMA PROCEDURE USING CRYOVEIN;  Surgeon: Waynetta Sandy,  MD;  Location: [place];  Service: Vascular;  Laterality: Left;   GROIN DEBRIDEMENT Left 04/27/2019   Procedure: GROIN DEBRIDEMENT;  Surgeon: Waynetta Sandy, MD;  Location: [place];  Service: Vascular;  Laterality: Left;   INSERTION OF ILIAC STENT  03/30/2019   Procedure: Stent of left common, external iliac veins and left common femoral vein;  Surgeon: Waynetta Sandy, MD;  Location: [place];  Service: Vascular;;   KNEE ARTHROSCOPY WITH MENISCAL REPAIR Left 11/14/2020   Procedure: LEFT KNEE ARTHROSCOPY WITH PARTIAL MEDIAL MENISCECTOMY;  Surgeon: Carole Civil, MD;  Location: [facility];  Service: Orthopedics;  Laterality: Left;   LOWER EXTREMITY VENOGRAPHY N/A 08/17/2018   Procedure: LOWER EXTREMITY VENOGRAPHY - Central Venogram;  Surgeon: Waynetta Sandy, MD;  Location: [place] [facility];  Service: Cardiovascular;  Laterality: N/A;   LOWER EXTREMITY VENOGRAPHY Bilateral 03/09/2019   Procedure: LOWER EXTREMITY VENOGRAPHY;  Surgeon: Waynetta Sandy, MD;  Location: [place] [facility];  Service: Cardiovascular;  Laterality: Bilateral;   LOWER EXTREMITY VENOGRAPHY Left 08/16/2019   Procedure: LOWER EXTREMITY VENOGRAPHY;  Surgeon: Waynetta Sandy, MD;  Location: [place] [facility];  Service: Cardiovascular;  Laterality: Left;   LUMBAR FUSION  11/21/2000   L5-S1   LUMBAR SPINE SURGERY     x 2 others   PATCH ANGIOPLASTY Left 03/30/2019   Procedure: Patch Angioplasty of the Left Common Femoral Vein using Venosure Biologic patch;  Surgeon: Waynetta Sandy, MD;  Location: [place];  Service: Vascular;  Laterality: Left;   PERIPHERAL VASCULAR INTERVENTION Left 08/16/2019   Procedure: PERIPHERAL VASCULAR INTERVENTION;  Surgeon: Waynetta Sandy, MD;  Location: [place] [facility];  Service: Cardiovascular;  Laterality: Left;  common femoral/femoral vein stent   TRIGGER FINGER RELEASE Right 12/01/2017   Procedure: RELEASE TRIGGER FINGER/A-1 PULLEY RIGHT THUMB;  Surgeon: Leanora Cover, MD;  Location: [place];  Service: Orthopedics;  Laterality: Right;   TRIGGER FINGER RELEASE Left 01/26/2018   Procedure: LEFT TRIGGER THUMB RELEASE;  Surgeon: Leanora Cover, MD;  Location: [place];  Service: Orthopedics;  Laterality: Left;   ULTRASOUND GUIDANCE FOR VASCULAR ACCESS Right 03/30/2019   Procedure: Ultrasound-guided cannulation right internal jugular vein;  Surgeon: Waynetta Sandy, MD;  Location: [place];  Service: Vascular;  Laterality: Right;    There were no vitals filed for this visit.   Subjective Assessment - 01/01/22 0820     Subjective Patient says she is doing well. Knees are stiff. Pain is about a 6 this AM.    Pertinent History x2 femoral artery stents and past DVTs(currently on coumadin)     Patient Stated Goals Avoid/delay Lt TKA    Currently in Pain? Yes    Pain Score 6     Pain Location Knee    Pain Orientation Right;Left    Pain Descriptors / Indicators Aching;Sore    Pain Type Chronic pain    Pain Onset More than a month ago                               [facility] Adult PT Treatment/Exercise - 01/01/22 0001       Knee/Hip Exercises: Stretches   Gastroc Stretch Both;3 reps;30 seconds      Knee/Hip Exercises: Aerobic   Nustep 4 min seat 9 lv 5      Knee/Hip Exercises: Standing   Heel Raises Both;20 reps    Terminal  Knee Extension 15 reps    Theraband Level (Terminal Knee Extension) Other (comment)   3 plates   Terminal Knee Extension Limitations 5" holds      Knee/Hip Exercises: Seated   Sit to Sand 20 reps;without UE support   from elevated surface     Knee/Hip Exercises: Supine   Quad Sets Both;10 reps    Quad Sets Limitations 5" hold    Short Arc Quad Sets Both;20 reps    Heel Slides Both;10 reps    Bridges Both;20 reps   2 sets of 10   Straight Leg Raises Both;2 sets;10 reps      Knee/Hip Exercises: Sidelying   Hip ABduction Both;2 sets;10 reps                          PT Long Term Goals - 12/24/21 1335       PT LONG TERM GOAL #1   Title Patient will achieve a Lt knee extension MMT of 5/5 and without onset of knee pain greater than 3/10.    Time 5    Period Weeks    Target Date 01/28/22      PT LONG TERM GOAL #2   Title Patient will be able to ambulate at least 424ft with the Lt knee hinge brace and without an AD before any increase in knee pain occurs.    Time 5    Period Weeks    Status New    Target Date 01/28/22      PT LONG TERM GOAL #3   Title Patient will achieve a resting level of bilateral knee pain no greater than 4/10.    Time 5    Period Weeks    Status New    Target Date 01/28/22      PT LONG TERM GOAL #4   Title Patient will be independent with her HEP.    Time 5    Period Weeks     Target Date 01/28/22      PT LONG TERM GOAL #5   Title Patient will achieve a Lt knee flexion and extension PROM within 5 and 3 degrees respectively to that of the Rt knee and without onset of Lt knee pain greater than 3/10.    Time 5    Period Weeks    Status New    Target Date 01/28/22                   Plan - 01/01/22 0849     Clinical Impression Statement Patient tolerated session well today. Able to progress LE strengthening and mobility with added machine TKEs and calf stretching from steps. Patient educated on proper form and function of all added activity. Patient will continue to benefit from skilled therapy services to reduce deficits and improve functional level.    Personal Factors and Comorbidities Age;Fitness;Comorbidity 1    Comorbidities Prediabetic    Examination-Activity Limitations [place];Caring for Others;Carry;Lift;Stand;Stairs;Squat;Sit;Locomotion Level    Examination-Participation Restrictions Cleaning;Community Activity;Driving;Laundry;Yard Work;Shop    Stability/Clinical Decision Making Stable/Uncomplicated    Rehab Potential Poor    PT Frequency 2x / week    PT Duration --   5 weeks   PT Treatment/Interventions ADLs/Self Care Home Management;Therapeutic activities;Functional mobility training;Stair training;Gait training;Therapeutic exercise;Balance training;Neuromuscular re-education;DME Instruction;Electrical Stimulation;Patient/family education;Passive range of motion;Joint Manipulations;Manual techniques    PT Next Visit Plan Continue with knee mobility program and add in flexion and extension stretches per patient tolerance.  PT Home Exercise Plan Supine Active Straight Leg Raise - 2-3 x daily - 7 x weekly - 2 sets - 10 reps  Seated Knee Extension AROM - 2-3 x daily - 7 x weekly - 2 sets - 12 reps  Standing Knee Flexion - 2-3 x daily - 7 x weekly - 2 sets - 10 reps  Sidelying Hip Abduction - 2-3 x daily - 7 x weekly - 2 sets - 10 reps  Ankle Pumps in  Elevation - 2-3 x daily - 7 x weekly - 20 reps    Consulted and Agree with Plan of Care Patient             Patient will benefit from skilled therapeutic intervention in order to improve the following deficits and impairments:  Abnormal gait, Difficulty walking, Decreased range of motion, Pain, Decreased strength, Decreased activity tolerance, Decreased knowledge of use of DME  Visit Diagnosis: Chronic pain of both knees  Difficulty in walking, not elsewhere classified     Problem List Patient Active Problem List   Diagnosis Date Noted   Chronic pain of both knees 10/22/2021   Hematuria 10/01/2021   Recurrent UTI 10/01/2021   Dyspnea on exertion 10/01/2021   Iron deficiency anemia 04/18/2021   Nocturnal sleep-related eating disorder 01/01/2021   Vitamin D deficiency 01/01/2021   Arthritis of left knee 12/26/2020   2019 novel coronavirus-infected pneumonia ([facility]) 12/03/2020   S/P left knee arthroscopy 11/14/20  11/21/2020   Old complex tear of medial meniscus of left knee    Primary osteoarthritis of left knee 11/09/2020   Subacromial bursitis of right shoulder joint 11/09/2020   Chronic pain syndrome 04/24/2020   Pharmacologic therapy 04/24/2020   Disorder of skeletal system 04/24/2020   Problems influencing health status 04/24/2020   History of thrombocytopenia 04/24/2020   Abnormal MRI, cervical spine (01/08/2017) 04/24/2020   Abnormal MRI, lumbar spine (05/11/2014) 04/24/2020   DDD (degenerative disc disease), cervical 04/24/2020   DDD (degenerative disc disease), lumbar 04/24/2020   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   Diabetic peripheral neuropathy (HCC) 04/24/2020   Neurogenic pain 04/24/2020   Chronic musculoskeletal pain 04/24/2020   Other proteinuria 03/30/2020   Myalgia 03/15/2020   Bilateral hand swelling 02/16/2020   Thrush 01/20/2020   Morbid obesity ([place]) 10/25/2019   History of back surgery 10/25/2019   Peripheral neuropathy 10/25/2019    Restless leg syndrome 10/25/2019   Chronic low back pain (Bilateral) w/ sciatica (Bilateral) 10/25/2019   Hypertension associated with diabetes ([place]) 10/25/2019   Type 2 diabetes mellitus with stage 3 chronic kidney disease, without long-term current use of insulin ([place]) 10/25/2019   Chronic anticoagulation (Coumadin) 10/25/2019   Hyperlipidemia associated with type 2 diabetes mellitus ([place]) 10/25/2019   CKD stage 3 due to type 2 diabetes mellitus ([place]) 10/25/2019   GERD (gastroesophageal reflux disease) 10/25/2019   Allergic rhinitis 10/25/2019   Chronic constipation 10/25/2019   Hypothyroidism    Normocytic anemia 09/15/2019   Iliac vein stenosis, 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   Chronic venous insufficiency 11/21/2017   Displacement of lumbar intervertebral disc without myelopathy 07/14/2017   Radicular low back pain 05/25/2014   Depression, major, recurrent ([place]) 06/17/2013   OCD (obsessive compulsive disorder) 11/20/2012   Insomnia due to mental disorder 09/25/2012   Bipolar 1 disorder ([place]) 09/25/2012   History of DVT (deep vein thrombosis) 08/18/2012   8:54 AM, 01/01/22 Josue Hector PT DPT  Physical Therapist with [facility]  (  [place]) [place], [place], [address] Phone: [phone]   Fax:  [phone]  Name: Leanza Shepperson MRN: [id number] Date of Birth: [birth year]

## 2022-01-01 NOTE — Telephone Encounter (Signed)
LMOM for patient to call office back to resch appt due to provider not being able to do appt due to meeting

## 2022-01-02 DIAGNOSIS — F3132 Bipolar disorder, current episode depressed, moderate: Secondary | ICD-10-CM | POA: Diagnosis not present

## 2022-01-03 ENCOUNTER — Encounter (HOSPITAL_COMMUNITY): Payer: Self-pay

## 2022-01-03 ENCOUNTER — Ambulatory Visit (HOSPITAL_COMMUNITY): Payer: Medicaid Other | Admitting: Physical Therapy

## 2022-01-03 ENCOUNTER — Telehealth (HOSPITAL_COMMUNITY): Payer: Medicaid Other | Admitting: Psychiatry

## 2022-01-04 DIAGNOSIS — R350 Frequency of micturition: Secondary | ICD-10-CM | POA: Diagnosis not present

## 2022-01-04 DIAGNOSIS — N390 Urinary tract infection, site not specified: Secondary | ICD-10-CM | POA: Diagnosis not present

## 2022-01-04 DIAGNOSIS — F3132 Bipolar disorder, current episode depressed, moderate: Secondary | ICD-10-CM | POA: Diagnosis not present

## 2022-01-05 ENCOUNTER — Other Ambulatory Visit: Payer: Self-pay | Admitting: Family Medicine

## 2022-01-05 DIAGNOSIS — E1159 Type 2 diabetes mellitus with other circulatory complications: Secondary | ICD-10-CM

## 2022-01-05 DIAGNOSIS — I152 Hypertension secondary to endocrine disorders: Secondary | ICD-10-CM

## 2022-01-05 DIAGNOSIS — F3132 Bipolar disorder, current episode depressed, moderate: Secondary | ICD-10-CM | POA: Diagnosis not present

## 2022-01-07 NOTE — Telephone Encounter (Signed)
Requested Prescriptions  ?Pending Prescriptions Disp Refills  ?? hydrochlorothiazide (HYDRODIURIL) 25 MG tablet [Pharmacy Med Name: hydrochlorothiazide 25 mg tablet] 90 tablet 1  ?  Sig: TAKE 1 TABLET BY MOUTH DAILY Please schedule office visit BEFORE any future refills  ?  ? Cardiovascular: Diuretics - Thiazide Failed - 01/05/2022  7:07 AM  ?  ?  Failed - Cr in normal range and within 180 days  ?  Creatinine, Ser  ?Date Value Ref Range Status  ?04/12/2021 1.23 (H) 0.44 - 1.00 mg/dL Final  ?   ?  ?  Failed - K in normal range and within 180 days  ?  Potassium  ?Date Value Ref Range Status  ?04/12/2021 3.8 3.5 - 5.1 mmol/L Final  ?   ?  ?  Failed - Na in normal range and within 180 days  ?  Sodium  ?Date Value Ref Range Status  ?04/12/2021 140 135 - 145 mmol/L Final  ?03/22/2021 142 134 - 144 mmol/L Final  ?   ?  ?  Passed - Last BP in normal range  ?  BP Readings from Last 1 Encounters:  ?12/31/21 114/62  ?   ?  ?  Passed - Valid encounter within last 6 months  ?  Recent Outpatient Visits   ?      ? 2 months ago Encounter for annual physical exam  ? [facility] [place], Dionne Bucy, MD  ? 3 months ago Dyspnea on exertion  ? [facility], Dionne Bucy, MD  ? 7 months ago Urinary frequency  ? [facility] [place], Dionne Bucy, MD  ? 9 months ago Polyuria  ? [place], PA-C  ? 9 months ago Pneumonia of left lung due to infectious organism, unspecified part of lung  ? [facility] Jon Billings, NP  ?  ?  ?Future Appointments   ?        ? In 1 week Hollice Espy, MD [place]  ? In 3 months [place], Dionne Bucy, MD [facility]  ?  ? ?  ?  ?  ? ? ?

## 2022-01-08 ENCOUNTER — Other Ambulatory Visit: Payer: Self-pay

## 2022-01-08 ENCOUNTER — Ambulatory Visit (HOSPITAL_COMMUNITY): Payer: Medicaid Other | Attending: Orthopedic Surgery | Admitting: Physical Therapy

## 2022-01-08 DIAGNOSIS — G8929 Other chronic pain: Secondary | ICD-10-CM

## 2022-01-08 DIAGNOSIS — M25562 Pain in left knee: Secondary | ICD-10-CM

## 2022-01-08 DIAGNOSIS — M25561 Pain in right knee: Secondary | ICD-10-CM | POA: Diagnosis not present

## 2022-01-08 DIAGNOSIS — Z7409 Other reduced mobility: Secondary | ICD-10-CM

## 2022-01-08 DIAGNOSIS — R262 Difficulty in walking, not elsewhere classified: Secondary | ICD-10-CM

## 2022-01-08 DIAGNOSIS — Z789 Other specified health status: Secondary | ICD-10-CM

## 2022-01-08 NOTE — Therapy (Signed)
[place], [place], [address] Phone: [phone]   Fax:  [phone]  Physical Therapy Treatment  Patient Details  Name: Jane [name] MRN: [id number] Date of Birth: 06-13-1966 Referring Provider (PT): Lovell Sheehan, MD   Encounter Date: 01/08/2022   PT End of Session - 01/08/22 0958     Visit Number 4    Number of Visits 10    Date for PT Re-Evaluation 01/28/22    Authorization Type [place] [employer] Healthy*    Authorization Time Period 10 visits approved 2/20-3/27/23    Authorization - Visit Number 4    Authorization - Number of Visits 10    Progress Note Due on Visit 10    PT Start Time 0920    PT Stop Time 1008    PT Time Calculation (min) 48 min    Activity Tolerance Patient tolerated treatment well    Behavior During Therapy WFL for tasks assessed/performed             Past Medical History:  Diagnosis Date   ADD (attention deficit disorder)    Anxiety    Back pain    Bilateral swelling of feet    Bipolar 1 disorder (HCC)    Bipolar 1 disorder (HCC)    Bipolar disorder (HCC)    Chewing difficulty    Chronic fatigue syndrome    Chronic kidney disease    Stage 3 kidney disease;dx by Dr. Sinda Du.    Constipation    Depression    Diabetes mellitus without complication (HCC)    diet controlled   Dyspnea    with exertion   GERD (gastroesophageal reflux disease)    Headache    migraines   High cholesterol    History of blood clots    History of DVT (deep vein thrombosis)    left leg   Hypertension    states under control with meds., has been on med. x 2 years   Hypothyroidism    Joint pain    Neuropathy    Obsessive-compulsive disorder    Peripheral vascular disease ([place])    Prediabetes    Respiratory failure requiring intubation ([employer])    Restless leg syndrome    Shortness of breath    Sleep apnea    Thrombocytopenia ([place]) 09/15/2019   Trigger thumb of left hand 01/2018    Trigger thumb of right hand     Past Surgical History:  Procedure Laterality Date   ABDOMINAL HYSTERECTOMY  06/2016   complete   APPLICATION OF WOUND VAC Left 04/27/2019   Procedure: APPLICATION OF WOUND VAC LEFT GROIN;  Surgeon: Waynetta Sandy, MD;  Location: [facility];  Service: Vascular;  Laterality: Left;   AV FISTULA PLACEMENT Left 11/24/2018   Procedure: ARTERIOVENOUS (AV) FISTULA CREATION LEFT SFA TO LEFT FEMORAL VEIN;  Surgeon: Waynetta Sandy, MD;  Location: [facility];  Service: Vascular;  Laterality: Left;   BACK SURGERY     CHOLECYSTECTOMY     COLONOSCOPY WITH PROPOFOL N/A 11/22/2019   Procedure: COLONOSCOPY WITH PROPOFOL;  Surgeon: [name], MD;  Location: [facility];  Service: Gastroenterology;  Laterality: N/A;   FEMORAL ARTERY EXPLORATION  03/30/2019   Procedure: Left Common Femoral Artery and Vein Exploration;  Surgeon: Waynetta Sandy, MD;  Location: [facility];  Service: Vascular;;   FEMORAL-FEMORAL BYPASS GRAFT Left 11/24/2018   Procedure: BYPASS GRAFT FEMORAL-FEMORAL VENOUS LEFT TO RIGHT PALMA PROCEDURE USING CRYOVEIN;  Surgeon: Waynetta Sandy,  MD;  Location: [facility];  Service: Vascular;  Laterality: Left;   GROIN DEBRIDEMENT Left 04/27/2019   Procedure: GROIN DEBRIDEMENT;  Surgeon: Waynetta Sandy, MD;  Location: [facility];  Service: Vascular;  Laterality: Left;   INSERTION OF ILIAC STENT  03/30/2019   Procedure: Stent of left common, external iliac veins and left common femoral vein;  Surgeon: Waynetta Sandy, MD;  Location: [facility];  Service: Vascular;;   KNEE ARTHROSCOPY WITH MENISCAL REPAIR Left 11/14/2020   Procedure: LEFT KNEE ARTHROSCOPY WITH PARTIAL MEDIAL MENISCECTOMY;  Surgeon: Carole Civil, MD;  Location: [facility];  Service: Orthopedics;  Laterality: Left;   LOWER EXTREMITY VENOGRAPHY N/A 08/17/2018   Procedure: LOWER EXTREMITY VENOGRAPHY - Central Venogram;  Surgeon: Waynetta Sandy, MD;  Location: [facility];  Service: Cardiovascular;  Laterality: N/A;   LOWER EXTREMITY VENOGRAPHY Bilateral 03/09/2019   Procedure: LOWER EXTREMITY VENOGRAPHY;  Surgeon: Waynetta Sandy, MD;  Location: [facility];  Service: Cardiovascular;  Laterality: Bilateral;   LOWER EXTREMITY VENOGRAPHY Left 08/16/2019   Procedure: LOWER EXTREMITY VENOGRAPHY;  Surgeon: Waynetta Sandy, MD;  Location: [facility];  Service: Cardiovascular;  Laterality: Left;   LUMBAR FUSION  11/21/2000   L5-S1   LUMBAR SPINE SURGERY     x 2 others   PATCH ANGIOPLASTY Left 03/30/2019   Procedure: Patch Angioplasty of the Left Common Femoral Vein using Venosure Biologic patch;  Surgeon: Waynetta Sandy, MD;  Location: [facility];  Service: Vascular;  Laterality: Left;   PERIPHERAL VASCULAR INTERVENTION Left 08/16/2019   Procedure: PERIPHERAL VASCULAR INTERVENTION;  Surgeon: Waynetta Sandy, MD;  Location: [facility];  Service: Cardiovascular;  Laterality: Left;  common femoral/femoral vein stent   TRIGGER FINGER RELEASE Right 12/01/2017   Procedure: RELEASE TRIGGER FINGER/A-1 PULLEY RIGHT THUMB;  Surgeon: Leanora Cover, MD;  Location: [facility];  Service: Orthopedics;  Laterality: Right;   TRIGGER FINGER RELEASE Left 01/26/2018   Procedure: LEFT TRIGGER THUMB RELEASE;  Surgeon: Leanora Cover, MD;  Location: [facility];  Service: Orthopedics;  Laterality: Left;   ULTRASOUND GUIDANCE FOR VASCULAR ACCESS Right 03/30/2019   Procedure: Ultrasound-guided cannulation right internal jugular vein;  Surgeon: Waynetta Sandy, MD;  Location: [facility];  Service: Vascular;  Laterality: Right;    There were no vitals filed for this visit.   Subjective Assessment - 01/08/22 0922     Subjective pt states both her knees are hurting today with LT slightly higher than Rt (Lt 7/10, Rt 6.5/10)    Currently in Pain? Yes    Pain Score 7     Pain Location Knee     Pain Orientation Right;Left    Pain Descriptors / Indicators Constant                               [facility] Adult PT Treatment/Exercise - 01/08/22 0001       Knee/Hip Exercises: Stretches   Active Hamstring Stretch Right;Left;3 reps;30 seconds    Active Hamstring Stretch Limitations onto 12" step    Gastroc Stretch Both;3 reps;30 seconds    Gastroc Stretch Limitations slant board      Knee/Hip Exercises: Aerobic   Nustep 6 min seat 9 lv 3      Knee/Hip Exercises: Standing   Heel Raises Both;20 reps    Forward Lunges Both;20 reps    Forward Lunges Limitations onto 4"  step without UE assist    Terminal Knee Extension 20 reps    Terminal Knee Extension Limitations 3PL bodycraft 5" holds each LE    Hip Abduction Both;20 reps    Hip Extension Both;20 reps      Knee/Hip Exercises: Seated   Long Arc Quad 10 reps;Both    Sit to Sand 20 reps;without UE support   from standard chair, no elevation     Knee/Hip Exercises: Supine   Bridges Both;20 reps    Straight Leg Raises Both;2 sets;10 reps    Knee Extension Left;Right    Knee Extension Limitations -4,-4    Knee Flexion Left;Right    Knee Flexion Limitations 025,427                          PT Long Term Goals - 12/24/21 1335       PT LONG TERM GOAL #1   Title Patient will achieve a Lt knee extension MMT of 5/5 and without onset of knee pain greater than 3/10.    Time 5    Period Weeks    Target Date 01/28/22      PT LONG TERM GOAL #2   Title Patient will be able to ambulate at least 440f with the Lt knee hinge brace and without an AD before any increase in knee pain occurs.    Time 5    Period Weeks    Status New    Target Date 01/28/22      PT LONG TERM GOAL #3   Title Patient will achieve a resting level of bilateral knee pain no greater than 4/10.    Time 5    Period Weeks    Status New    Target Date 01/28/22      PT LONG TERM GOAL #4   Title Patient will be independent  with her HEP.    Time 5    Period Weeks    Target Date 01/28/22      PT LONG TERM GOAL #5   Title Patient will achieve a Lt knee flexion and extension PROM within 5 and 3 degrees respectively to that of the Rt knee and without onset of Lt knee pain greater than 3/10.    Time 5    Period Weeks    Status New    Target Date 01/28/22                   Plan - 01/08/22 0957     Clinical Impression Statement Continued with hip strengthening and LE stretching to bil LEs.  Noted tightness and effectiveness of added hamstring stretch in standing as well as gastroc stretching as posterior chain is extremely tight.   Able to complete standing without UE from standard chair repeated without additional riser needed this session.  Improved AROM of bil knees at -4 to 128 with being equilateral as well.  Pt voiced steps being most challenging at EOS; informed we would work on this next visit.  Pt will continue to benefit from skilled physical therapy to improve independence and reduce pain.    Personal Factors and Comorbidities Age;Fitness;Comorbidity 1    Comorbidities Prediabetic    Examination-Activity Limitations Bend;Caring for Others;Carry;Lift;Stand;Stairs;Squat;Sit;Locomotion Level    Examination-Participation Restrictions Cleaning;Community Activity;Driving;Laundry;Yard Work;Shop    Stability/Clinical Decision Making Stable/Uncomplicated    Rehab Potential Poor    PT Frequency 2x / week    PT Duration --   5 weeks  PT Treatment/Interventions ADLs/Self Care Home Management;Therapeutic activities;Functional mobility training;Stair training;Gait training;Therapeutic exercise;Balance training;Neuromuscular re-education;DME Instruction;Electrical Stimulation;Patient/family education;Passive range of motion;Joint Manipulations;Manual techniques    PT Next Visit Plan Continue with knee mobility program and add in flexion and extension stretches per patient tolerance.  Begin step ups/downs and  stair training.    PT Home Exercise Plan Supine Active Straight Leg Raise - 2-3 x daily - 7 x weekly - 2 sets - 10 reps  Seated Knee Extension AROM - 2-3 x daily - 7 x weekly - 2 sets - 12 reps  Standing Knee Flexion - 2-3 x daily - 7 x weekly - 2 sets - 10 reps  Sidelying Hip Abduction - 2-3 x daily - 7 x weekly - 2 sets - 10 reps  Ankle Pumps in Elevation - 2-3 x daily - 7 x weekly - 20 reps    Consulted and Agree with Plan of Care Patient             Patient will benefit from skilled therapeutic intervention in order to improve the following deficits and impairments:  Abnormal gait, Difficulty walking, Decreased range of motion, Pain, Decreased strength, Decreased activity tolerance, Decreased knowledge of use of DME  Visit Diagnosis: Chronic pain of both knees  Difficulty in walking, not elsewhere classified  Impaired mobility and ADLs     Problem List Patient Active Problem List   Diagnosis Date Noted   Chronic pain of both knees 10/22/2021   Hematuria 10/01/2021   Recurrent UTI 10/01/2021   Dyspnea on exertion 10/01/2021   Iron deficiency anemia 04/18/2021   Nocturnal sleep-related eating disorder 01/01/2021   Vitamin D deficiency 01/01/2021   Arthritis of left knee 12/26/2020   2019 novel coronavirus-infected pneumonia ([facility]) 12/03/2020   S/P left knee arthroscopy 11/14/20  11/21/2020   Old complex tear of medial meniscus of left knee    Primary osteoarthritis of left knee 11/09/2020   Subacromial bursitis of right shoulder joint 11/09/2020   Chronic pain syndrome 04/24/2020   Pharmacologic therapy 04/24/2020   Disorder of skeletal system 04/24/2020   Problems influencing health status 04/24/2020   History of thrombocytopenia 04/24/2020   Abnormal MRI, cervical spine (01/08/2017) 04/24/2020   Abnormal MRI, lumbar spine (05/11/2014) 04/24/2020   DDD (degenerative disc disease), cervical 04/24/2020   DDD (degenerative disc disease), lumbar 04/24/2020   Failed back  surgical syndrome 04/24/2020   Diabetic peripheral neuropathy ([place]) 04/24/2020   Neurogenic pain 04/24/2020   Chronic musculoskeletal pain 04/24/2020   Other proteinuria 03/30/2020   Myalgia 03/15/2020   Bilateral hand swelling 02/16/2020   Thrush 01/20/2020   Morbid obesity ([place]) 10/25/2019   History of back surgery 10/25/2019   Peripheral neuropathy 10/25/2019   Restless leg syndrome 10/25/2019   Chronic low back pain (Bilateral) w/ sciatica (Bilateral) 10/25/2019   Hypertension associated with diabetes ([place]) 10/25/2019   Type 2 diabetes mellitus with stage 3 chronic kidney disease, without long-term current use of insulin ([place]) 10/25/2019   Chronic anticoagulation (Coumadin) 10/25/2019   Hyperlipidemia associated with type 2 diabetes mellitus ([place]) 10/25/2019   CKD stage 3 due to type 2 diabetes mellitus ([place]) 10/25/2019   GERD (gastroesophageal reflux disease) 10/25/2019   Allergic rhinitis 10/25/2019   Chronic constipation 10/25/2019   Hypothyroidism    Normocytic anemia 09/15/2019   Iliac vein stenosis, 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 [place], [place], [address] Phone: [phone]   Fax:  [phone]  Name: Jane [name] MRN: [id number] Date of Birth: [birth year]

## 2022-01-10 ENCOUNTER — Encounter (HOSPITAL_COMMUNITY): Payer: Medicaid Other | Admitting: Physical Therapy

## 2022-01-11 ENCOUNTER — Telehealth (INDEPENDENT_AMBULATORY_CARE_PROVIDER_SITE_OTHER): Payer: Medicaid Other | Admitting: Psychiatry

## 2022-01-11 ENCOUNTER — Encounter (HOSPITAL_COMMUNITY): Payer: Self-pay | Admitting: Psychiatry

## 2022-01-11 ENCOUNTER — Other Ambulatory Visit: Payer: Self-pay

## 2022-01-11 DIAGNOSIS — F900 Attention-deficit hyperactivity disorder, predominantly inattentive type: Secondary | ICD-10-CM

## 2022-01-11 DIAGNOSIS — F3130 Bipolar disorder, current episode depressed, mild or moderate severity, unspecified: Secondary | ICD-10-CM | POA: Diagnosis not present

## 2022-01-11 DIAGNOSIS — F3132 Bipolar disorder, current episode depressed, moderate: Secondary | ICD-10-CM | POA: Diagnosis not present

## 2022-01-11 MED ORDER — FLUOXETINE HCL 20 MG PO CAPS: 20.0000 mg | ORAL_CAPSULE | Freq: Every day | ORAL | 2 refills | Status: DC

## 2022-01-11 MED ORDER — ALPRAZOLAM 1 MG PO TABS: 1.0000 mg | ORAL_TABLET | Freq: Two times a day (BID) | ORAL | 2 refills | Status: DC | PRN

## 2022-01-11 MED ORDER — FLUOXETINE HCL 40 MG PO CAPS: ORAL_CAPSULE | ORAL | 2 refills | Status: DC

## 2022-01-11 MED ORDER — AMPHETAMINE-DEXTROAMPHETAMINE 30 MG PO TABS: 30.0000 mg | ORAL_TABLET | Freq: Two times a day (BID) | ORAL | 0 refills | Status: DC

## 2022-01-11 NOTE — Progress Notes (Signed)
Virtual Visit via Telephone Note  I connected with Jane [name] on 01/11/22 at 10:00 AM EST by telephone and verified that I am speaking with the correct person using two identifiers.  Location: Patient: home Provider: home office   I discussed the limitations, risks, security and privacy concerns of performing an evaluation and management service by telephone and the availability of in person appointments. I also discussed with the patient that there may be a patient responsible charge related to this service. The patient expressed understanding and agreed to proceed.      I discussed the assessment and treatment plan with the patient. The patient was provided an opportunity to ask questions and all were answered. The patient agreed with the plan and demonstrated an understanding of the instructions.   The patient was advised to call back or seek an in-person evaluation if the symptoms worsen or if the condition fails to improve as anticipated.  I provided 15 minutes of non-face-to-face time during this encounter.   [name], MD  [facility] MD/PA/NP OP Progress Note  01/11/2022 10:25 AM Jane [name]  MRN:  [id number]  Chief Complaint:  Chief Complaint  Patient presents with   Depression   Anxiety   Follow-up   HPI: This patient is a 56 year old divorced white female who lives alone in [place].  She is on disability due to bipolar disorder.  The patient returns for follow-up after 3 months.  She states that none of her family members are speaking to her.  They have sided with her sister.  Her sister had come down from up [place] to live with her and her sister was very messy and the patient had asked her to leave.  This had upset her daughters who are now siding with the sister.  The patient states this is not really bothering her.  She spends time with her best friend Butch Penny and Donna's family.  She is doing a lot to try to improve her health.  She is doing physical therapy [facility]  [employer], and starting her GED classes.  She states that her mood is stable and she denies significant depression or anxiety.  She is sleeping well most of the time.  She denies thoughts of self-harm or suicidal ideation or any manic symptoms such as agitation.  She is focusing well with the Adderall Visit Diagnosis:    ICD-10-CM   1. Bipolar I disorder, most recent episode depressed ([place])  F31.30     2. Attention deficit hyperactivity disorder (ADHD), predominantly inattentive type  F90.0       Past Psychiatric History: Long-term outpatient treatment  Past Medical History:  Past Medical History:  Diagnosis Date   ADD (attention deficit disorder)    Anxiety    Back pain    Bilateral swelling of feet    Bipolar 1 disorder (HCC)    Bipolar 1 disorder (HCC)    Bipolar disorder (HCC)    Chewing difficulty    Chronic fatigue syndrome    Chronic kidney disease    Stage 3 kidney disease;dx by Dr. Sinda Du.    Constipation    Depression    Diabetes mellitus without complication (HCC)    diet controlled   Dyspnea    with exertion   GERD (gastroesophageal reflux disease)    Headache    migraines   High cholesterol    History of blood clots    History of DVT (deep vein thrombosis)    left leg   Hypertension  states under control with meds., has been on med. x 2 years   Hypothyroidism    Joint pain    Neuropathy    Obsessive-compulsive disorder    Peripheral vascular disease ([place])    Prediabetes    Respiratory failure requiring intubation ([place])    Restless leg syndrome    Shortness of breath    Sleep apnea    Thrombocytopenia ([place]) 09/15/2019   Trigger thumb of left hand 01/2018   Trigger thumb of right hand     Past Surgical History:  Procedure Laterality Date   ABDOMINAL HYSTERECTOMY  06/2016   complete   APPLICATION OF WOUND VAC Left 04/27/2019   Procedure: APPLICATION OF WOUND VAC LEFT GROIN;  Surgeon: Waynetta Sandy, MD;  Location: [place];  Service:  Vascular;  Laterality: Left;   AV FISTULA PLACEMENT Left 11/24/2018   Procedure: ARTERIOVENOUS (AV) FISTULA CREATION LEFT SFA TO LEFT FEMORAL VEIN;  Surgeon: Waynetta Sandy, MD;  Location: [place];  Service: Vascular;  Laterality: Left;   BACK SURGERY     CHOLECYSTECTOMY     COLONOSCOPY WITH PROPOFOL N/A 11/22/2019   Procedure: COLONOSCOPY WITH PROPOFOL;  Surgeon: [name], MD;  Location: [facility] ENDOSCOPY;  Service: Gastroenterology;  Laterality: N/A;   FEMORAL ARTERY EXPLORATION  03/30/2019   Procedure: Left Common Femoral Artery and Vein Exploration;  Surgeon: Waynetta Sandy, MD;  Location: [place];  Service: Vascular;;   FEMORAL-FEMORAL BYPASS GRAFT Left 11/24/2018   Procedure: BYPASS GRAFT FEMORAL-FEMORAL VENOUS LEFT TO RIGHT PALMA PROCEDURE USING CRYOVEIN;  Surgeon: Waynetta Sandy, MD;  Location: [place];  Service: Vascular;  Laterality: Left;   GROIN DEBRIDEMENT Left 04/27/2019   Procedure: GROIN DEBRIDEMENT;  Surgeon: Waynetta Sandy, MD;  Location: [place];  Service: Vascular;  Laterality: Left;   INSERTION OF ILIAC STENT  03/30/2019   Procedure: Stent of left common, external iliac veins and left common femoral vein;  Surgeon: Waynetta Sandy, MD;  Location: [place];  Service: Vascular;;   KNEE ARTHROSCOPY WITH MENISCAL REPAIR Left 11/14/2020   Procedure: LEFT KNEE ARTHROSCOPY WITH PARTIAL MEDIAL MENISCECTOMY;  Surgeon: Carole Civil, MD;  Location: [facility];  Service: Orthopedics;  Laterality: Left;   LOWER EXTREMITY VENOGRAPHY N/A 08/17/2018   Procedure: LOWER EXTREMITY VENOGRAPHY - Central Venogram;  Surgeon: Waynetta Sandy, MD;  Location: [place] [facility];  Service: Cardiovascular;  Laterality: N/A;   LOWER EXTREMITY VENOGRAPHY Bilateral 03/09/2019   Procedure: LOWER EXTREMITY VENOGRAPHY;  Surgeon: Waynetta Sandy, MD;  Location: [place] [facility];  Service: Cardiovascular;  Laterality: Bilateral;   LOWER EXTREMITY  VENOGRAPHY Left 08/16/2019   Procedure: LOWER EXTREMITY VENOGRAPHY;  Surgeon: Waynetta Sandy, MD;  Location: [place] [facility];  Service: Cardiovascular;  Laterality: Left;   LUMBAR FUSION  11/21/2000   L5-S1   LUMBAR SPINE SURGERY     x 2 others   PATCH ANGIOPLASTY Left 03/30/2019   Procedure: Patch Angioplasty of the Left Common Femoral Vein using Venosure Biologic patch;  Surgeon: Waynetta Sandy, MD;  Location: [place];  Service: Vascular;  Laterality: Left;   PERIPHERAL VASCULAR INTERVENTION Left 08/16/2019   Procedure: PERIPHERAL VASCULAR INTERVENTION;  Surgeon: Waynetta Sandy, MD;  Location: [place] [facility];  Service: Cardiovascular;  Laterality: Left;  common femoral/femoral vein stent   TRIGGER FINGER RELEASE Right 12/01/2017   Procedure: RELEASE TRIGGER FINGER/A-1 PULLEY RIGHT THUMB;  Surgeon: Leanora Cover, MD;  Location: [place];  Service:  Orthopedics;  Laterality: Right;   TRIGGER FINGER RELEASE Left 01/26/2018   Procedure: LEFT TRIGGER THUMB RELEASE;  Surgeon: Leanora Cover, MD;  Location: [place];  Service: Orthopedics;  Laterality: Left;   ULTRASOUND GUIDANCE FOR VASCULAR ACCESS Right 03/30/2019   Procedure: Ultrasound-guided cannulation right internal jugular vein;  Surgeon: Waynetta Sandy, MD;  Location: [place];  Service: Vascular;  Laterality: Right;    Family Psychiatric History: see below  Family History:  Family History  Problem Relation Age of Onset   Heart disease Mother    Hyperlipidemia Mother    Hypertension Mother    Bipolar disorder Mother    Stroke Mother    Depression Mother    Sleep apnea Mother    Obesity Mother    Diabetes Father    Heart disease Father    Hyperlipidemia Father    Hypertension Father    Sleep apnea Father    Obesity Father    Drug abuse Daughter    ADD / ADHD Daughter    Drug abuse Daughter    Anxiety disorder Daughter    Bipolar disorder Daughter     Hypertension Sister    Hypertension Brother    Hyperlipidemia Brother    Heart disease Brother    Bipolar disorder Maternal Aunt    Suicidality Maternal Aunt     Social History:  Social History   Socioeconomic History   Marital status: Divorced    Spouse name: Not on file   Number of children: 3   Years of education: Not on file   Highest education level: Not on file  Occupational History   Not on file  Tobacco Use   Smoking status: Never   Smokeless tobacco: Never  Vaping Use   Vaping Use: Never used  Substance and Sexual Activity   Alcohol use: No   Drug use: No   Sexual activity: Not Currently    Partners: Male    Birth control/protection: Surgical  Other Topics Concern   Not on file  Social History Narrative   Not on file   Social Determinants of Health   Financial Resource Strain: Not on file  Food Insecurity: Not on file  Transportation Needs: Not on file  Physical Activity: Not on file  Stress: Not on file  Social Connections: Not on file    Allergies:  Allergies  Allergen Reactions   Aripiprazole Other (See Comments)    BECOMES  VIOLENT    Seroquel [Quetiapine Fumarate] Other (See Comments)    BECOMES VIOLENT   Chlorpromazine Other (See Comments)    SEVERE ANXIETY    Gabapentin Other (See Comments)    NIGHTMARES     Metabolic Disorder Labs: Lab Results  Component Value Date   HGBA1C 6.2 (H) 11/06/2021   MPG 131.24 11/06/2021   MPG 134.11 03/26/2019   No results found for: PROLACTIN Lab Results  Component Value Date   CHOL 191 11/06/2021   TRIG 187 (H) 11/06/2021   HDL 38 (L) 11/06/2021   CHOLHDL 5.0 11/06/2021   VLDL 37 11/06/2021   LDLCALC 116 (H) 11/06/2021   LDLCALC 114 (H) 03/29/2020   Lab Results  Component Value Date   TSH 0.107 (L) 05/18/2020   TSH 0.275 (L) 03/29/2020    Therapeutic Level Labs: No results found for: LITHIUM Lab Results  Component Value Date   VALPROATE 61.2 03/23/2014   No components found for:   CBMZ  Current Medications: Current Outpatient Medications  Medication Sig Dispense Refill  ALPRAZolam (XANAX) 1 MG tablet Take 1 tablet (1 mg total) by mouth 2 (two) times daily as needed. for anxiety 60 tablet 2   amitriptyline (ELAVIL) 25 MG tablet Take 25 mg by mouth at bedtime.     amLODipine (NORVASC) 5 MG tablet Take 1 tablet (5 mg total) by mouth daily. 90 tablet 0   amphetamine-dextroamphetamine (ADDERALL) 30 MG tablet Take 1 tablet by mouth 2 (two) times daily. 60 tablet 0   amphetamine-dextroamphetamine (ADDERALL) 30 MG tablet Take 1 tablet by mouth 2 (two) times daily. 60 tablet 0   amphetamine-dextroamphetamine (ADDERALL) 30 MG tablet Take 1 tablet by mouth 2 (two) times daily. 60 tablet 0   aspirin EC 81 MG tablet Take 81 mg by mouth daily.     atorvastatin (LIPITOR) 40 MG tablet Take 1 tablet (40 mg total) by mouth daily. 90 tablet 1   capsaicin (ZOSTRIX) 0.025 % cream Apply topically 2 (two) times daily. 60 g 0   cefpodoxime (VANTIN) 200 MG tablet Take 200 mg by mouth 2 (two) times daily.     cetirizine (ZYRTEC) 10 MG tablet TAKE 1 TABLET BY MOUTH DAILY 90 tablet 1   conjugated estrogens (PREMARIN) vaginal cream Estrogen Cream Instruction  Discard applicator  Apply pea sized amount to tip of finger to urethra before bed. Wash hands well after application. Use Monday, Wednesday and Friday 42.5 g 12   cycloSPORINE (RESTASIS) 0.05 % ophthalmic emulsion Place 1 drop into both eyes 2 (two) times daily.     FLUoxetine (PROZAC) 20 MG capsule Take 1 capsule (20 mg total) by mouth daily. 30 capsule 2   FLUoxetine (PROZAC) 40 MG capsule TAKE 1 TABLET BY MOUTH DAILY 90 capsule 2   GARLIC PO Take 2,353 mg by mouth daily.     hydrochlorothiazide (HYDRODIURIL) 25 MG tablet TAKE 1 TABLET BY MOUTH DAILY Please schedule office visit BEFORE any future refills 90 tablet 1   HYDROcodone-acetaminophen (NORCO) 10-325 MG tablet Take 1 tablet by mouth every 6 (six) hours as needed. To last 30  days from fill date 120 tablet 0   [START ON 01/30/2022] HYDROcodone-acetaminophen (NORCO) 10-325 MG tablet Take 1 tablet by mouth every 6 (six) hours as needed. To last 30 days from fill date 120 tablet 0   [START ON 03/01/2022] HYDROcodone-acetaminophen (NORCO) 10-325 MG tablet Take 1 tablet by mouth every 6 (six) hours as needed. To last 30 days from fill date 120 tablet 0   levothyroxine (SYNTHROID) 125 MCG tablet Take 125 mcg by mouth daily before breakfast.     LINZESS 145 MCG CAPS capsule TAKE ONE CAPSULE BY MOUTH DAILY (Patient taking differently: Take 145 mcg by mouth daily as needed.) 90 capsule 3   lisinopril (ZESTRIL) 5 MG tablet Take 1 tablet (5 mg total) by mouth daily. 90 tablet 1   methocarbamol (ROBAXIN) 750 MG tablet Take 1 tablet (750 mg total) by mouth 2 (two) times daily as needed for muscle spasms. 60 tablet 2   Multiple Vitamin (MULTIVITAMIN) capsule Take 1 capsule by mouth daily.     Olopatadine HCl 0.2 % SOLN Place 1 drop into both eyes daily.     Omega-3 Fatty Acids (OMEGA-3 FISH OIL PO) Take 600 mg by mouth daily.     pantoprazole (PROTONIX) 40 MG tablet TAKE 1 TABLET BY MOUTH DAILY (Patient taking differently: Take 40 mg by mouth daily.) 90 tablet 3   potassium chloride (KLOR-CON M) 10 MEQ tablet TAKE 1 TABLET BY MOUTH THREE TIMES DAILY  90 tablet 1   pregabalin (LYRICA) 100 MG capsule Take 100 mg by mouth 4 (four) times daily.     rOPINIRole (REQUIP) 2 MG tablet TAKE 1 TABLET BY MOUTH TWICE DAILY (Patient taking differently: Take 2 mg by mouth 2 (two) times daily.) 180 tablet 3   Semaglutide,0.25 or 0.'5MG'$ /DOS, (OZEMPIC, 0.25 OR 0.5 MG/DOSE,) 2 MG/1.5ML SOPN Inject 0.25 mg into the skin once a week. 1.5 mL 0   Vibegron (GEMTESA) 75 MG TABS Take 75 mg by mouth daily. 30 tablet 11   warfarin (COUMADIN) 5 MG tablet 1/2 tablet on Monday and Wednesday 1 tablet all other days 60 tablet 3   No current facility-administered medications for this visit.      Musculoskeletal: Strength & Muscle Tone: na Gait & Station: na Patient leans: N/A  Psychiatric Specialty Exam: Review of Systems  Musculoskeletal:  Positive for arthralgias, back pain and myalgias.  All other systems reviewed and are negative.  There were no vitals taken for this visit.There is no height or weight on file to calculate BMI.  General Appearance: NA  Eye Contact:  NA  Speech:  Clear and Coherent  Volume:  Normal  Mood:  Euthymic  Affect:  NA  Thought Process:  Goal Directed  Orientation:  Full (Time, Place, and Person)  Thought Content: WDL   Suicidal Thoughts:  No  Homicidal Thoughts:  No  Memory:  Immediate;   Good Recent;   Good Remote;   Fair  Judgement:  Good  Insight:  Good  Psychomotor Activity:  Normal  Concentration:  Concentration: Good and Attention Span: Good  Recall:  Good  Fund of Knowledge: Good  Language: Good  Akathisia:  No  Handed:  Right  AIMS (if indicated): not done  Assets:  Communication Skills Desire for Improvement Resilience Social Support Talents/Skills  ADL's:  Intact  Cognition: WNL  Sleep:  Good   Screenings: PHQ2-9    Flowsheet Row Video Visit from 01/11/2022 in [place] Office Visit from 10/22/2021 in [place] Video Visit from 10/11/2021 in [place] Office Visit from 10/09/2021 in [place] Video Visit from 08/29/2021 in [facility]  PHQ-2 Total Score 0 2 0 0 0  PHQ-9 Total Score -- 17 -- -- --      Flowsheet Row Video Visit from 01/11/2022 in [facility] Video Visit from 10/11/2021 in [facility] Video Visit from 08/29/2021 in [place] No Risk No Risk No Risk         Assessment and Plan: Patient is a 56 year old female with a history of depression anxiety and ADD, possible bipolar disorder.  She continues to do well on her current regimen.  She will continue Prozac 60 mg daily for depression, Xanax 1 mg twice daily as needed for anxiety and Adderall 30 mg twice daily for ADD.  She will return to see me in 3 months  Collaboration of Care: Collaboration of Care: Primary Care Provider AEB chart notes will be made available to PCP at patient's request  Patient/Guardian was advised Release of Information must be obtained prior to any record release in order to collaborate their care with an outside provider. Patient/Guardian was advised if they have not already done so to contact the registration department to sign all necessary forms in order for [place] to release information regarding their care.  Consent: Patient/Guardian gives verbal consent for treatment and assignment of benefits for services provided during this visit. Patient/Guardian expressed understanding and agreed to proceed.    [name], MD 01/11/2022, 10:25 AM

## 2022-01-12 DIAGNOSIS — F3132 Bipolar disorder, current episode depressed, moderate: Secondary | ICD-10-CM | POA: Diagnosis not present

## 2022-01-14 ENCOUNTER — Other Ambulatory Visit: Payer: Self-pay

## 2022-01-14 ENCOUNTER — Ambulatory Visit (HOSPITAL_COMMUNITY): Payer: Medicaid Other | Admitting: Physical Therapy

## 2022-01-14 DIAGNOSIS — G8929 Other chronic pain: Secondary | ICD-10-CM | POA: Diagnosis not present

## 2022-01-14 DIAGNOSIS — M25562 Pain in left knee: Secondary | ICD-10-CM | POA: Diagnosis not present

## 2022-01-14 DIAGNOSIS — Z789 Other specified health status: Secondary | ICD-10-CM | POA: Diagnosis not present

## 2022-01-14 DIAGNOSIS — Z7409 Other reduced mobility: Secondary | ICD-10-CM | POA: Diagnosis not present

## 2022-01-14 DIAGNOSIS — R262 Difficulty in walking, not elsewhere classified: Secondary | ICD-10-CM | POA: Diagnosis not present

## 2022-01-14 DIAGNOSIS — M25561 Pain in right knee: Secondary | ICD-10-CM | POA: Diagnosis not present

## 2022-01-14 NOTE — Progress Notes (Incomplete)
01/14/22 8:59 AM   [name] November 10-May-1966 [id number]  Referring provider:  [name], [address] [place],  [place] [address] No chief complaint on file.    HPI: Jane [name] is a 56 y.o.female  with a personal history of chronic kidney disease stage III, acute cystitis and OAB , who presents today for a 3 month follow-up.   She is managed on Gemtesa and vaginal estrogen cream.   She underwent a cystoscopy on 10/03/2021 showed irritation at the trigone c/w benign trigonitis.      PMH: Past Medical History:  Diagnosis Date   ADD (attention deficit disorder)    Anxiety    Back pain    Bilateral swelling of feet    Bipolar 1 disorder (HCC)    Bipolar 1 disorder (HCC)    Bipolar disorder (HCC)    Chewing difficulty    Chronic fatigue syndrome    Chronic kidney disease    Stage 3 kidney disease;dx by Dr. Sinda Du.    Constipation    Depression    Diabetes mellitus without complication (HCC)    diet controlled   Dyspnea    with exertion   GERD (gastroesophageal reflux disease)    Headache    migraines   High cholesterol    History of blood clots    History of DVT (deep vein thrombosis)    left leg   Hypertension    states under control with meds., has been on med. x 2 years   Hypothyroidism    Joint pain    Neuropathy    Obsessive-compulsive disorder    Peripheral vascular disease ([place])    Prediabetes    Respiratory failure requiring intubation ([place])    Restless leg syndrome    Shortness of breath    Sleep apnea    Thrombocytopenia ([place]) 09/15/2019   Trigger thumb of left hand 01/2018   Trigger thumb of right hand     Surgical History: Past Surgical History:  Procedure Laterality Date   ABDOMINAL HYSTERECTOMY  06/2016   complete   APPLICATION OF WOUND VAC Left 04/27/2019   Procedure: APPLICATION OF WOUND VAC LEFT GROIN;  Surgeon: Waynetta Sandy, MD;  Location: [facility];  Service: Vascular;  Laterality:  Left;   AV FISTULA PLACEMENT Left 11/24/2018   Procedure: ARTERIOVENOUS (AV) FISTULA CREATION LEFT SFA TO LEFT FEMORAL VEIN;  Surgeon: Waynetta Sandy, MD;  Location: [facility];  Service: Vascular;  Laterality: Left;   BACK SURGERY     CHOLECYSTECTOMY     COLONOSCOPY WITH PROPOFOL N/A 11/22/2019   Procedure: COLONOSCOPY WITH PROPOFOL;  Surgeon: [name], MD;  Location: [facility] ENDOSCOPY;  Service: Gastroenterology;  Laterality: N/A;   FEMORAL ARTERY EXPLORATION  03/30/2019   Procedure: Left Common Femoral Artery and Vein Exploration;  Surgeon: Waynetta Sandy, MD;  Location: [facility];  Service: Vascular;;   FEMORAL-FEMORAL BYPASS GRAFT Left 11/24/2018   Procedure: BYPASS GRAFT FEMORAL-FEMORAL VENOUS LEFT TO RIGHT PALMA PROCEDURE USING CRYOVEIN;  Surgeon: Waynetta Sandy, MD;  Location: [facility];  Service: Vascular;  Laterality: Left;   GROIN DEBRIDEMENT Left 04/27/2019   Procedure: GROIN DEBRIDEMENT;  Surgeon: Waynetta Sandy, MD;  Location: [facility];  Service: Vascular;  Laterality: Left;   INSERTION OF ILIAC STENT  03/30/2019   Procedure: Stent of left common, external iliac veins and left common femoral vein;  Surgeon: Waynetta Sandy, MD;  Location: [facility];  Service: Vascular;;   KNEE ARTHROSCOPY  WITH MENISCAL REPAIR Left 11/14/2020   Procedure: LEFT KNEE ARTHROSCOPY WITH PARTIAL MEDIAL MENISCECTOMY;  Surgeon: Carole Civil, MD;  [facility];  Service: Orthopedics;  Laterality: Left;   LOWER EXTREMITY VENOGRAPHY N/A 08/17/2018   Procedure: LOWER EXTREMITY VENOGRAPHY - Central Venogram;  Surgeon: Waynetta Sandy, MD;  Location: [facility];  Service: Cardiovascular;  Laterality: N/A;   LOWER EXTREMITY VENOGRAPHY Bilateral 03/09/2019   Procedure: LOWER EXTREMITY VENOGRAPHY;  Surgeon: Waynetta Sandy, MD;  Location: [facility];  Service: Cardiovascular;  Laterality: Bilateral;   LOWER EXTREMITY VENOGRAPHY Left 08/16/2019    Procedure: LOWER EXTREMITY VENOGRAPHY;  Surgeon: Waynetta Sandy, MD;  Location: [facility];  Service: Cardiovascular;  Laterality: Left;   LUMBAR FUSION  11/21/2000   L5-S1   LUMBAR SPINE SURGERY     x 2 others   PATCH ANGIOPLASTY Left 03/30/2019   Procedure: Patch Angioplasty of the Left Common Femoral Vein using Venosure Biologic patch;  Surgeon: Waynetta Sandy, MD;  Location: [facility];  Service: Vascular;  Laterality: Left;   PERIPHERAL VASCULAR INTERVENTION Left 08/16/2019   Procedure: PERIPHERAL VASCULAR INTERVENTION;  Surgeon: Waynetta Sandy, MD;  Location: [facility];  Service: Cardiovascular;  Laterality: Left;  common femoral/femoral vein stent   TRIGGER FINGER RELEASE Right 12/01/2017   Procedure: RELEASE TRIGGER FINGER/A-1 PULLEY RIGHT THUMB;  Surgeon: Leanora Cover, MD;  Location: [facility];  Service: Orthopedics;  Laterality: Right;   TRIGGER FINGER RELEASE Left 01/26/2018   Procedure: LEFT TRIGGER THUMB RELEASE;  Surgeon: Leanora Cover, MD;  Location: [facility];  Service: Orthopedics;  Laterality: Left;   ULTRASOUND GUIDANCE FOR VASCULAR ACCESS Right 03/30/2019   Procedure: Ultrasound-guided cannulation right internal jugular vein;  Surgeon: Waynetta Sandy, MD;  Location: [facility];  Service: Vascular;  Laterality: Right;    Home Medications:  Allergies as of 01/16/2022       Reactions   Aripiprazole Other (See Comments)   BECOMES  VIOLENT   Seroquel [quetiapine Fumarate] Other (See Comments)   BECOMES VIOLENT   Chlorpromazine Other (See Comments)   SEVERE ANXIETY   Gabapentin Other (See Comments)   NIGHTMARES        Medication List        Accurate as of January 14, 2022  8:59 AM. If you have any questions, ask your nurse or doctor.          ALPRAZolam 1 MG tablet Commonly known as: XANAX Take 1 tablet (1 mg total) by mouth 2 (two) times daily as needed. for anxiety    amitriptyline 25 MG tablet Commonly known as: ELAVIL Take 25 mg by mouth at bedtime.   amLODipine 5 MG tablet Commonly known as: NORVASC Take 1 tablet (5 mg total) by mouth daily.   amphetamine-dextroamphetamine 30 MG tablet Commonly known as: Adderall Take 1 tablet by mouth 2 (two) times daily.   amphetamine-dextroamphetamine 30 MG tablet Commonly known as: Adderall Take 1 tablet by mouth 2 (two) times daily.   amphetamine-dextroamphetamine 30 MG tablet Commonly known as: Adderall Take 1 tablet by mouth 2 (two) times daily.   aspirin EC 81 MG tablet Take 81 mg by mouth daily.   atorvastatin 40 MG tablet Commonly known as: LIPITOR Take 1 tablet (40 mg total) by mouth daily.   capsaicin 0.025 % cream Commonly known as: ZOSTRIX Apply topically 2 (two) times daily.   cefpodoxime 200 MG tablet Commonly known as: VANTIN Take  200 mg by mouth 2 (two) times daily.   cetirizine 10 MG tablet Commonly known as: ZYRTEC TAKE 1 TABLET BY MOUTH DAILY   cycloSPORINE 0.05 % ophthalmic emulsion Commonly known as: RESTASIS Place 1 drop into both eyes 2 (two) times daily.   FLUoxetine 20 MG capsule Commonly known as: PROZAC Take 1 capsule (20 mg total) by mouth daily.   FLUoxetine 40 MG capsule Commonly known as: PROZAC TAKE 1 TABLET BY MOUTH DAILY   GARLIC PO Take 1,660 mg by mouth daily.   Gemtesa 75 MG Tabs Generic drug: Vibegron Take 75 mg by mouth daily.   hydrochlorothiazide 25 MG tablet Commonly known as: HYDRODIURIL TAKE 1 TABLET BY MOUTH DAILY Please schedule office visit BEFORE any future refills   HYDROcodone-acetaminophen 10-325 MG tablet Commonly known as: NORCO Take 1 tablet by mouth every 6 (six) hours as needed. To last 30 days from fill date   HYDROcodone-acetaminophen 10-325 MG tablet Commonly known as: NORCO Take 1 tablet by mouth every 6 (six) hours as needed. To last 30 days from fill date Start taking on: January 30, 2022    HYDROcodone-acetaminophen 10-325 MG tablet Commonly known as: NORCO Take 1 tablet by mouth every 6 (six) hours as needed. To last 30 days from fill date Start taking on: March 01, 2022   levothyroxine 125 MCG tablet Commonly known as: SYNTHROID Take 125 mcg by mouth daily before breakfast.   Linzess 145 MCG Caps capsule Generic drug: linaclotide TAKE ONE CAPSULE BY MOUTH DAILY What changed:  how much to take when to take this reasons to take this   lisinopril 5 MG tablet Commonly known as: ZESTRIL Take 1 tablet (5 mg total) by mouth daily.   methocarbamol 750 MG tablet Commonly known as: ROBAXIN Take 1 tablet (750 mg total) by mouth 2 (two) times daily as needed for muscle spasms.   multivitamin capsule Take 1 capsule by mouth daily.   Olopatadine HCl 0.2 % Soln Place 1 drop into both eyes daily.   OMEGA-3 FISH OIL PO Take 600 mg by mouth daily.   Ozempic (0.25 or 0.5 MG/DOSE) 2 MG/1.5ML Sopn Generic drug: Semaglutide(0.25 or 0.'5MG'$ /DOS) Inject 0.25 mg into the skin once a week.   pantoprazole 40 MG tablet Commonly known as: PROTONIX TAKE 1 TABLET BY MOUTH DAILY   potassium chloride 10 MEQ tablet Commonly known as: KLOR-CON M TAKE 1 TABLET BY MOUTH THREE TIMES DAILY   pregabalin 100 MG capsule Commonly known as: LYRICA Take 100 mg by mouth 4 (four) times daily.   Premarin vaginal cream Generic drug: conjugated estrogens Estrogen Cream Instruction  Discard applicator  Apply pea sized amount to tip of finger to urethra before bed. Wash hands well after application. Use Monday, Wednesday and Friday   rOPINIRole 2 MG tablet Commonly known as: REQUIP TAKE 1 TABLET BY MOUTH TWICE DAILY   warfarin 5 MG tablet Commonly known as: COUMADIN Take as directed by the anticoagulation clinic. If you are unsure how to take this medication, talk to your nurse or doctor. Original instructions: 1/2 tablet on Monday and Wednesday 1 tablet all other days         Allergies:  Allergies  Allergen Reactions   Aripiprazole Other (See Comments)    BECOMES  VIOLENT    Seroquel [Quetiapine Fumarate] Other (See Comments)    BECOMES VIOLENT   Chlorpromazine Other (See Comments)    SEVERE ANXIETY    Gabapentin Other (See Comments)    NIGHTMARES  Family History: Family History  Problem Relation Age of Onset   Heart disease Mother    Hyperlipidemia Mother    Hypertension Mother    Bipolar disorder Mother    Stroke Mother    Depression Mother    Sleep apnea Mother    Obesity Mother    Diabetes Father    Heart disease Father    Hyperlipidemia Father    Hypertension Father    Sleep apnea Father    Obesity Father    Drug abuse Daughter    ADD / ADHD Daughter    Drug abuse Daughter    Anxiety disorder Daughter    Bipolar disorder Daughter    Hypertension Sister    Hypertension Brother    Hyperlipidemia Brother    Heart disease Brother    Bipolar disorder Maternal Aunt    Suicidality Maternal Aunt     Social History:  reports that she has never smoked. She has never used smokeless tobacco. She reports that she does not drink alcohol and does not use drugs.   Physical Exam: LMP  (LMP Unknown)   Constitutional:  Alert and oriented, No acute distress. HEENT: [place] AT, moist mucus membranes.  Trachea midline, no masses. Cardiovascular: No clubbing, cyanosis, or edema. Respiratory: Normal respiratory effort, no increased work of breathing. Skin: No rashes, bruises or suspicious lesions. Neurologic: Grossly intact, no focal deficits, moving all 4 extremities. Psychiatric: Normal mood and affect.  Laboratory Data: Lab Results  Component Value Date   CREATININE 1.23 (H) 04/12/2021   Lab Results  Component Value Date   HGBA1C 6.2 (H) 11/06/2021    Urinalysis   Pertinent Imaging:    Assessment & Plan:     No follow-ups on file.  I,Kailey Littlejohn,acting as a Education administrator for Hollice Espy, MD.,have documented all  relevant documentation on the behalf of Hollice Espy, MD,as directed by  Hollice Espy, MD while in the presence of Hollice Espy, [place] [address] [place], [place] [address] [phone]

## 2022-01-14 NOTE — Therapy (Signed)
[facility] [place], [place], [address] Phone: [phone]   Fax:  [phone]  Physical Therapy Treatment  Patient Details  Name: Jane [name] MRN: [id number] Date of Birth: 07-Apr-1966 Referring Provider (PT): Lovell Sheehan, MD   Encounter Date: 01/14/2022   PT End of Session - 01/14/22 0952     Visit Number 5    Number of Visits 10    Date for PT Re-Evaluation 01/28/22    Authorization Type [place] Medicaid Healthy*    Authorization Time Period 10 visits approved 2/20-3/27/23    Authorization - Visit Number 5    Authorization - Number of Visits 10    Progress Note Due on Visit 10    PT Start Time 0949    PT Stop Time 1029    PT Time Calculation (min) 40 min    Activity Tolerance Patient tolerated treatment well    Behavior During Therapy WFL for tasks assessed/performed             Past Medical History:  Diagnosis Date   ADD (attention deficit disorder)    Anxiety    Back pain    Bilateral swelling of feet    Bipolar 1 disorder (HCC)    Bipolar 1 disorder (HCC)    Bipolar disorder (HCC)    Chewing difficulty    Chronic fatigue syndrome    Chronic kidney disease    Stage 3 kidney disease;dx by Dr. Sinda Du.    Constipation    Depression    Diabetes mellitus without complication (HCC)    diet controlled   Dyspnea    with exertion   GERD (gastroesophageal reflux disease)    Headache    migraines   High cholesterol    History of blood clots    History of DVT (deep vein thrombosis)    left leg   Hypertension    states under control with meds., has been on med. x 2 years   Hypothyroidism    Joint pain    Neuropathy    Obsessive-compulsive disorder    Peripheral vascular disease ([place])    Prediabetes    Respiratory failure requiring intubation ([place])    Restless leg syndrome    Shortness of breath    Sleep apnea    Thrombocytopenia ([place]) 09/15/2019   Trigger thumb of left hand 01/2018    Trigger thumb of right hand     Past Surgical History:  Procedure Laterality Date   ABDOMINAL HYSTERECTOMY  06/2016   complete   APPLICATION OF WOUND VAC Left 04/27/2019   Procedure: APPLICATION OF WOUND VAC LEFT GROIN;  Surgeon: Waynetta Sandy, MD;  Location: [facility];  Service: Vascular;  Laterality: Left;   AV FISTULA PLACEMENT Left 11/24/2018   Procedure: ARTERIOVENOUS (AV) FISTULA CREATION LEFT SFA TO LEFT FEMORAL VEIN;  Surgeon: Waynetta Sandy, MD;  Location: [facility];  Service: Vascular;  Laterality: Left;   BACK SURGERY     CHOLECYSTECTOMY     COLONOSCOPY WITH PROPOFOL N/A 11/22/2019   Procedure: COLONOSCOPY WITH PROPOFOL;  Surgeon: [name], MD;  Location: [facility];  Service: Gastroenterology;  Laterality: N/A;   FEMORAL ARTERY EXPLORATION  03/30/2019   Procedure: Left Common Femoral Artery and Vein Exploration;  Surgeon: Waynetta Sandy, MD;  Location: [facility];  Service: Vascular;;   FEMORAL-FEMORAL BYPASS GRAFT Left 11/24/2018   Procedure: BYPASS GRAFT FEMORAL-FEMORAL VENOUS LEFT TO RIGHT PALMA PROCEDURE USING CRYOVEIN;  Surgeon: Waynetta Sandy,  MD;  Location: [facility];  Service: Vascular;  Laterality: Left;   GROIN DEBRIDEMENT Left 04/27/2019   Procedure: GROIN DEBRIDEMENT;  Surgeon: Waynetta Sandy, MD;  Location: [facility];  Service: Vascular;  Laterality: Left;   INSERTION OF ILIAC STENT  03/30/2019   Procedure: Stent of left common, external iliac veins and left common femoral vein;  Surgeon: Waynetta Sandy, MD;  Location: [facility];  Service: Vascular;;   KNEE ARTHROSCOPY WITH MENISCAL REPAIR Left 11/14/2020   Procedure: LEFT KNEE ARTHROSCOPY WITH PARTIAL MEDIAL MENISCECTOMY;  Surgeon: Carole Civil, MD;  [facility];  Service: Orthopedics;  Laterality: Left;   LOWER EXTREMITY VENOGRAPHY N/A 08/17/2018   Procedure: LOWER EXTREMITY VENOGRAPHY - Central Venogram;  Surgeon: Waynetta Sandy, MD;  Location: [facility];  Service: Cardiovascular;  Laterality: N/A;   LOWER EXTREMITY VENOGRAPHY Bilateral 03/09/2019   Procedure: LOWER EXTREMITY VENOGRAPHY;  Surgeon: Waynetta Sandy, MD;  Location: [facility];  Service: Cardiovascular;  Laterality: Bilateral;   LOWER EXTREMITY VENOGRAPHY Left 08/16/2019   Procedure: LOWER EXTREMITY VENOGRAPHY;  Surgeon: Waynetta Sandy, MD;  Location: [facility];  Service: Cardiovascular;  Laterality: Left;   LUMBAR FUSION  11/21/2000   L5-S1   LUMBAR SPINE SURGERY     x 2 others   PATCH ANGIOPLASTY Left 03/30/2019   Procedure: Patch Angioplasty of the Left Common Femoral Vein using Venosure Biologic patch;  Surgeon: Waynetta Sandy, MD;  Location: [facility];  Service: Vascular;  Laterality: Left;   PERIPHERAL VASCULAR INTERVENTION Left 08/16/2019   Procedure: PERIPHERAL VASCULAR INTERVENTION;  Surgeon: Waynetta Sandy, MD;  Location: [facility];  Service: Cardiovascular;  Laterality: Left;  common femoral/femoral vein stent   TRIGGER FINGER RELEASE Right 12/01/2017   Procedure: RELEASE TRIGGER FINGER/A-1 PULLEY RIGHT THUMB;  Surgeon: Leanora Cover, MD;  Location: [facility];  Service: Orthopedics;  Laterality: Right;   TRIGGER FINGER RELEASE Left 01/26/2018   Procedure: LEFT TRIGGER THUMB RELEASE;  Surgeon: Leanora Cover, MD;  Location: [facility];  Service: Orthopedics;  Laterality: Left;   ULTRASOUND GUIDANCE FOR VASCULAR ACCESS Right 03/30/2019   Procedure: Ultrasound-guided cannulation right internal jugular vein;  Surgeon: Waynetta Sandy, MD;  Location: [facility];  Service: Vascular;  Laterality: Right;    There were no vitals filed for this visit.   Subjective Assessment - 01/14/22 0952     Subjective Patient reports that her knees are feeling stiff this morning and she couldn't sleep last night. She states that last Wednesday evening she was putting a couch and a  metal bar fell across her feet causing severe bruising in the toes.    Currently in Pain? Yes    Pain Score 6     Pain Location Knee    Pain Orientation Right;Left                               [facility] Adult PT Treatment/Exercise - 01/14/22 0001       Knee/Hip Exercises: Aerobic   Nustep x24mn seat 11 lv 3      Knee/Hip Exercises: Standing   Terminal Knee Extension Other (comment)    Theraband Level (Terminal Knee Extension) --   SL TKE #4 3x8x5s holds   Other Standing Knee Exercises Toe raises on slight decline 2x12      Knee/Hip Exercises: Seated   Other Seated Knee/Hip Exercises  Knee swings w/ 10lbs AWs x21mn, seated knee flexion(cybex) w/ isometric at 90, 45, and 10 degrees flexion #2.5 1x6x3s and #5.5 2x6x3s                          PT Long Term Goals - 12/24/21 1335       PT LONG TERM GOAL #1   Title Patient will achieve a Lt knee extension MMT of 5/5 and without onset of knee pain greater than 3/10.    Time 5    Period Weeks    Target Date 01/28/22      PT LONG TERM GOAL #2   Title Patient will be able to ambulate at least 4061fwith the Lt knee hinge brace and without an AD before any increase in knee pain occurs.    Time 5    Period Weeks    Status New    Target Date 01/28/22      PT LONG TERM GOAL #3   Title Patient will achieve a resting level of bilateral knee pain no greater than 4/10.    Time 5    Period Weeks    Status New    Target Date 01/28/22      PT LONG TERM GOAL #4   Title Patient will be independent with her HEP.    Time 5    Period Weeks    Target Date 01/28/22      PT LONG TERM GOAL #5   Title Patient will achieve a Lt knee flexion and extension PROM within 5 and 3 degrees respectively to that of the Rt knee and without onset of Lt knee pain greater than 3/10.    Time 5    Period Weeks    Status New    Target Date 01/28/22                   Plan - 01/14/22 1004     Clinical Impression  Statement Patient tolerated treatment well during today's session and responded well with slowed eccentrics and short isometric holds. Gait improved slightly and there was a reported decrease in pain level by the end of the session.    Personal Factors and Comorbidities Age;Fitness;Comorbidity 1    Comorbidities Prediabetic    Examination-Activity Limitations [place];Caring for Others;Carry;Lift;Stand;Stairs;Squat;Sit;Locomotion Level    Examination-Participation Restrictions Cleaning;Community Activity;Driving;Laundry;Yard Work;Shop    Stability/Clinical Decision Making Stable/Uncomplicated    Rehab Potential Poor    PT Frequency 2x / week    PT Duration --   5 weeks   PT Treatment/Interventions ADLs/Self Care Home Management;Therapeutic activities;Functional mobility training;Stair training;Gait training;Therapeutic exercise;Balance training;Neuromuscular re-education;DME Instruction;Electrical Stimulation;Patient/family education;Passive range of motion;Joint Manipulations;Manual techniques    PT Next Visit Plan Continue with knee mobility program and add in flexion and extension stretches per patient tolerance.  Begin step ups/downs and stair training.    PT Home Exercise Plan Supine Active Straight Leg Raise - 2-3 x daily - 7 x weekly - 2 sets - 10 reps  Seated Knee Extension AROM - 2-3 x daily - 7 x weekly - 2 sets - 12 reps  Standing Knee Flexion - 2-3 x daily - 7 x weekly - 2 sets - 10 reps  Sidelying Hip Abduction - 2-3 x daily - 7 x weekly - 2 sets - 10 reps  Ankle Pumps in Elevation - 2-3 x daily - 7 x weekly - 20 reps    Consulted and Agree with Plan of  Care Patient             Patient will benefit from skilled therapeutic intervention in order to improve the following deficits and impairments:  Abnormal gait, Difficulty walking, Decreased range of motion, Pain, Decreased strength, Decreased activity tolerance, Decreased knowledge of use of DME  Visit Diagnosis: Chronic pain of both  knees  Difficulty in walking, not elsewhere classified  Impaired mobility and ADLs     Problem List Patient Active Problem List   Diagnosis Date Noted   Chronic pain of both knees 10/22/2021   Hematuria 10/01/2021   Recurrent UTI 10/01/2021   Dyspnea on exertion 10/01/2021   Iron deficiency anemia 04/18/2021   Nocturnal sleep-related eating disorder 01/01/2021   Vitamin D deficiency 01/01/2021   Arthritis of left knee 12/26/2020   2019 novel coronavirus-infected pneumonia ([facility]) 12/03/2020   S/P left knee arthroscopy 11/14/20  11/21/2020   Old complex tear of medial meniscus of left knee    Primary osteoarthritis of left knee 11/09/2020   Subacromial bursitis of right shoulder joint 11/09/2020   Chronic pain syndrome 04/24/2020   Pharmacologic therapy 04/24/2020   Disorder of skeletal system 04/24/2020   Problems influencing health status 04/24/2020   History of thrombocytopenia 04/24/2020   Abnormal MRI, cervical spine (01/08/2017) 04/24/2020   Abnormal MRI, lumbar spine (05/11/2014) 04/24/2020   DDD (degenerative disc disease), cervical 04/24/2020   DDD (degenerative disc disease), lumbar 04/24/2020   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   Diabetic peripheral neuropathy ([place]) 04/24/2020   Neurogenic pain 04/24/2020   Chronic musculoskeletal pain 04/24/2020   Other proteinuria 03/30/2020   Myalgia 03/15/2020   Bilateral hand swelling 02/16/2020   Thrush 01/20/2020   Morbid obesity ([place]) 10/25/2019   History of back surgery 10/25/2019   Peripheral neuropathy 10/25/2019   Restless leg syndrome 10/25/2019   Chronic low back pain (Bilateral) w/ sciatica (Bilateral) 10/25/2019   Hypertension associated with diabetes ([place]) 10/25/2019   Type 2 diabetes mellitus with stage 3 chronic kidney disease, without long-term current use of insulin ([place]) 10/25/2019   Chronic anticoagulation (Coumadin) 10/25/2019   Hyperlipidemia associated with type 2 diabetes mellitus ([place])  10/25/2019   CKD stage 3 due to type 2 diabetes mellitus ([place]) 10/25/2019   GERD (gastroesophageal reflux disease) 10/25/2019   Allergic rhinitis 10/25/2019   Chronic constipation 10/25/2019   Hypothyroidism    Normocytic anemia 09/15/2019   Iliac vein stenosis, 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

## 2022-01-15 ENCOUNTER — Encounter: Payer: Medicaid Other | Admitting: Student in an Organized Health Care Education/Training Program

## 2022-01-16 ENCOUNTER — Ambulatory Visit: Payer: Medicaid Other | Admitting: Urology

## 2022-01-16 ENCOUNTER — Other Ambulatory Visit: Payer: Self-pay

## 2022-01-16 ENCOUNTER — Other Ambulatory Visit (HOSPITAL_COMMUNITY): Payer: Self-pay

## 2022-01-16 ENCOUNTER — Encounter (HOSPITAL_COMMUNITY): Payer: Self-pay | Admitting: Hematology

## 2022-01-16 ENCOUNTER — Encounter: Payer: Self-pay | Admitting: Urology

## 2022-01-16 VITALS — BP 121/73 | HR 76 | Ht 70.0 in | Wt 263.0 lb

## 2022-01-16 DIAGNOSIS — E1165 Type 2 diabetes mellitus with hyperglycemia: Secondary | ICD-10-CM | POA: Diagnosis not present

## 2022-01-16 DIAGNOSIS — E039 Hypothyroidism, unspecified: Secondary | ICD-10-CM | POA: Diagnosis not present

## 2022-01-16 DIAGNOSIS — N3281 Overactive bladder: Secondary | ICD-10-CM | POA: Diagnosis not present

## 2022-01-16 DIAGNOSIS — N183 Chronic kidney disease, stage 3 unspecified: Secondary | ICD-10-CM | POA: Diagnosis not present

## 2022-01-16 DIAGNOSIS — E1122 Type 2 diabetes mellitus with diabetic chronic kidney disease: Secondary | ICD-10-CM | POA: Diagnosis not present

## 2022-01-16 DIAGNOSIS — F3132 Bipolar disorder, current episode depressed, moderate: Secondary | ICD-10-CM | POA: Diagnosis not present

## 2022-01-16 DIAGNOSIS — N3946 Mixed incontinence: Secondary | ICD-10-CM

## 2022-01-16 NOTE — Patient Instructions (Signed)
Cranberry tablets as directed. Probiotics as directed. D-Mannose as directed  ? ? ?Please remember to use estrogen 3 times a week ?

## 2022-01-17 ENCOUNTER — Encounter (HOSPITAL_COMMUNITY): Payer: Self-pay | Admitting: Physical Therapy

## 2022-01-17 ENCOUNTER — Telehealth (INDEPENDENT_AMBULATORY_CARE_PROVIDER_SITE_OTHER): Payer: Self-pay | Admitting: Family Medicine

## 2022-01-17 ENCOUNTER — Ambulatory Visit (HOSPITAL_COMMUNITY): Payer: Medicaid Other | Admitting: Physical Therapy

## 2022-01-17 ENCOUNTER — Encounter (INDEPENDENT_AMBULATORY_CARE_PROVIDER_SITE_OTHER): Payer: Self-pay | Admitting: Family Medicine

## 2022-01-17 ENCOUNTER — Encounter (INDEPENDENT_AMBULATORY_CARE_PROVIDER_SITE_OTHER): Payer: Self-pay

## 2022-01-17 ENCOUNTER — Other Ambulatory Visit (HOSPITAL_COMMUNITY): Payer: Self-pay

## 2022-01-17 DIAGNOSIS — G8929 Other chronic pain: Secondary | ICD-10-CM | POA: Diagnosis not present

## 2022-01-17 DIAGNOSIS — R262 Difficulty in walking, not elsewhere classified: Secondary | ICD-10-CM | POA: Diagnosis not present

## 2022-01-17 DIAGNOSIS — Z7409 Other reduced mobility: Secondary | ICD-10-CM | POA: Diagnosis not present

## 2022-01-17 DIAGNOSIS — M25562 Pain in left knee: Secondary | ICD-10-CM | POA: Diagnosis not present

## 2022-01-17 DIAGNOSIS — Z789 Other specified health status: Secondary | ICD-10-CM | POA: Diagnosis not present

## 2022-01-17 DIAGNOSIS — M25561 Pain in right knee: Secondary | ICD-10-CM | POA: Diagnosis not present

## 2022-01-17 NOTE — Telephone Encounter (Signed)
Prior authorization approved for Ozempic. Effective 01/17/22 - 01/17/23. Patient sent approval via mychart message.  ?

## 2022-01-17 NOTE — Telephone Encounter (Signed)
Dr.Ukleja 

## 2022-01-17 NOTE — Telephone Encounter (Signed)
Prior authorization was started and approved for Ozempic. Patient sent mychart message of approval this morning.  ?

## 2022-01-17 NOTE — Telephone Encounter (Signed)
Can you please check PA, Thanks

## 2022-01-17 NOTE — Therapy (Signed)
[facility] ?[address] ?[place], [place], [address] ?Phone: [phone]   Fax:  [phone] ? ?Physical Therapy Treatment ? ?Patient Details  ?Name: Jane [name] ?MRN: [id number] ?Date of Birth: 12-03-1965 ?Referring Provider (PT): Lovell Sheehan, MD ? ? ?Encounter Date: 01/17/2022 ? ? PT End of Session - 01/17/22 0903   ? ? Visit Number 6   ? Number of Visits 10   ? Date for PT Re-Evaluation 01/28/22   ? Authorization Type [place] Medicaid Healthy*   ? Authorization Time Period 10 visits approved 2/20-3/27/23   ? Authorization - Visit Number 6   ? Authorization - Number of Visits 10   ? Progress Note Due on Visit 10   ? PT Start Time 0900   ? PT Stop Time [phone]   ? PT Time Calculation (min) 42 min   ? Activity Tolerance Patient tolerated treatment well   ? Behavior During Therapy [facility] for tasks assessed/performed   ? ?  ?  ? ?  ? ? ?Past Medical History:  ?Diagnosis Date  ? ADD (attention deficit disorder)   ? Anxiety   ? Back pain   ? Bilateral swelling of feet   ? Bipolar 1 disorder ([place])   ? Bipolar 1 disorder ([place])   ? Bipolar disorder ([place])   ? Chewing difficulty   ? Chronic fatigue syndrome   ? Chronic kidney disease   ? Stage 3 kidney disease;dx by Dr. Sinda Du.   ? Constipation   ? Depression   ? Diabetes mellitus without complication ([place])   ? diet controlled  ? Dyspnea   ? with exertion  ? GERD (gastroesophageal reflux disease)   ? Headache   ? migraines  ? High cholesterol   ? History of blood clots   ? History of DVT (deep vein thrombosis)   ? left leg  ? Hypertension   ? states under control with meds., has been on med. x 2 years  ? Hypothyroidism   ? Joint pain   ? Neuropathy   ? Obsessive-compulsive disorder   ? Peripheral vascular disease ([place])   ? Prediabetes   ? Respiratory failure requiring intubation ([place])   ? Restless leg syndrome   ? Shortness of breath   ? Sleep apnea   ? Thrombocytopenia ([place]) 09/15/2019  ? Trigger thumb of left hand 01/2018  ?  Trigger thumb of right hand   ? ? ?Past Surgical History:  ?Procedure Laterality Date  ? ABDOMINAL HYSTERECTOMY  06/2016  ? complete  ? APPLICATION OF WOUND VAC Left 04/27/2019  ? Procedure: APPLICATION OF WOUND VAC LEFT GROIN;  Surgeon: Waynetta Sandy, MD;  Location: [facility];  Service: Vascular;  Laterality: Left;  ? AV FISTULA PLACEMENT Left 11/24/2018  ? Procedure: ARTERIOVENOUS (AV) FISTULA CREATION LEFT SFA TO LEFT FEMORAL VEIN;  Surgeon: Waynetta Sandy, MD;  Location: [facility];  Service: Vascular;  Laterality: Left;  ? BACK SURGERY    ? CHOLECYSTECTOMY    ? COLONOSCOPY WITH PROPOFOL N/A 11/22/2019  ? Procedure: COLONOSCOPY WITH PROPOFOL;  Surgeon: [name], MD;  Location: [facility];  Service: Gastroenterology;  Laterality: N/A;  ? FEMORAL ARTERY EXPLORATION  03/30/2019  ? Procedure: Left Common Femoral Artery and Vein Exploration;  Surgeon: Waynetta Sandy, MD;  Location: [facility];  Service: Vascular;;  ? FEMORAL-FEMORAL BYPASS GRAFT Left 11/24/2018  ? Procedure: BYPASS GRAFT FEMORAL-FEMORAL VENOUS LEFT TO RIGHT PALMA PROCEDURE USING CRYOVEIN;  Surgeon: Waynetta Sandy,  MD;  Location: [facility];  Service: Vascular;  Laterality: Left;  ? GROIN DEBRIDEMENT Left 04/27/2019  ? Procedure: GROIN DEBRIDEMENT;  Surgeon: Waynetta Sandy, MD;  Location: [facility];  Service: Vascular;  Laterality: Left;  ? INSERTION OF ILIAC STENT  03/30/2019  ? Procedure: Stent of left common, external iliac veins and left common femoral vein;  Surgeon: Waynetta Sandy, MD;  Location: [facility];  Service: Vascular;;  ? KNEE ARTHROSCOPY WITH MENISCAL REPAIR Left 11/14/2020  ? Procedure: LEFT KNEE ARTHROSCOPY WITH PARTIAL MEDIAL MENISCECTOMY;  Surgeon: Carole Civil, MD;  [facility];  Service: Orthopedics;  Laterality: Left;  ? LOWER EXTREMITY VENOGRAPHY N/A 08/17/2018  ? Procedure: LOWER EXTREMITY VENOGRAPHY - Central Venogram;  Surgeon: Waynetta Sandy, MD;  Location: [facility];  Service: Cardiovascular;  Laterality: N/A;  ? LOWER EXTREMITY VENOGRAPHY Bilateral 03/09/2019  ? Procedure: LOWER EXTREMITY VENOGRAPHY;  Surgeon: Waynetta Sandy, MD;  Location: [facility];  Service: Cardiovascular;  Laterality: Bilateral;  ? LOWER EXTREMITY VENOGRAPHY Left 08/16/2019  ? Procedure: LOWER EXTREMITY VENOGRAPHY;  Surgeon: Waynetta Sandy, MD;  Location: [facility];  Service: Cardiovascular;  Laterality: Left;  ? LUMBAR FUSION  11/21/2000  ? L5-S1  ? LUMBAR SPINE SURGERY    ? x 2 others  ? PATCH ANGIOPLASTY Left 03/30/2019  ? Procedure: Patch Angioplasty of the Left Common Femoral Vein using Venosure Biologic patch;  Surgeon: Waynetta Sandy, MD;  Location: [facility];  Service: Vascular;  Laterality: Left;  ? PERIPHERAL VASCULAR INTERVENTION Left 08/16/2019  ? Procedure: PERIPHERAL VASCULAR INTERVENTION;  Surgeon: Waynetta Sandy, MD;  Location: [facility] [facility];  Service: Cardiovascular;  Laterality: Left;  common femoral/femoral vein stent  ? TRIGGER FINGER RELEASE Right 12/01/2017  ? Procedure: RELEASE TRIGGER FINGER/A-1 PULLEY RIGHT THUMB;  Surgeon: Leanora Cover, MD;  Location: [facility];  Service: Orthopedics;  Laterality: Right;  ? TRIGGER FINGER RELEASE Left 01/26/2018  ? Procedure: LEFT TRIGGER THUMB RELEASE;  Surgeon: Leanora Cover, MD;  Location: [facility];  Service: Orthopedics;  Laterality: Left;  ? ULTRASOUND GUIDANCE FOR VASCULAR ACCESS Right 03/30/2019  ? Procedure: Ultrasound-guided cannulation right internal jugular vein;  Surgeon: Waynetta Sandy, MD;  Location: [facility];  Service: Vascular;  Laterality: Right;  ? ? ?There were no vitals filed for this visit. ? ? Subjective Assessment - 01/17/22 0904   ? ? Subjective Patient reports that she started her water aerobics class and that has been going well expect for the water boiler that heats the pool is broken so the water was 75  degrees. She says that her knee pain is improving and she knows that she just needs to avoid letting her knees become stiff to stay out of pain.   ? Currently in Pain? Yes   ? Pain Score 4    ? Pain Location Knee   ? Pain Orientation Left   ? Pain Score 3   ? Pain Location Knee   ? Pain Orientation Right   ? ?  ?  ? ?  ? ? ? ? ? ? ? ? ? ? ? ? ? ? ? ? ? ? ? ? [facility] Adult PT Treatment/Exercise - 01/17/22 0001   ? ?  ? Knee/Hip Exercises: Aerobic  ? Nustep x10mn seat 11 lv 3   ?  ? Knee/Hip Exercises: Standing  ? Other Standing Knee Exercises Hamstring curls w/ 10lbs AWs 4x8 each   ?  Other Standing Knee Exercises Deficit calf raises 3x12 and Deficits toe raises 4x8   ?  ? Knee/Hip Exercises: Seated  ? Long [employer] Other (comment)   10lbs AWs [id number] each  ? Other Seated Knee/Hip Exercises Knee swings w/ 10lbs AWs x36mn   ? ?  ?  ? ?  ? ? ? ? ? ? ? ? ? ? ? ? ? ? ? PT Long Term Goals - 12/24/21 1335   ? ?  ? PT LONG TERM GOAL #1  ? Title Patient will achieve a Lt knee extension MMT of 5/5 and without onset of knee pain greater than 3/10.   ? Time 5   ? Period Weeks   ? Target Date 01/28/22   ?  ? PT LONG TERM GOAL #2  ? Title Patient will be able to ambulate at least 4067fwith the Lt knee hinge brace and without an AD before any increase in knee pain occurs.   ? Time 5   ? Period Weeks   ? Status New   ? Target Date 01/28/22   ?  ? PT LONG TERM GOAL #3  ? Title Patient will achieve a resting level of bilateral knee pain no greater than 4/10.   ? Time 5   ? Period Weeks   ? Status New   ? Target Date 01/28/22   ?  ? PT LONG TERM GOAL #4  ? Title Patient will be independent with her HEP.   ? Time 5   ? Period Weeks   ? Target Date 01/28/22   ?  ? PT LONG TERM GOAL #5  ? Title Patient will achieve a Lt knee flexion and extension PROM within 5 and 3 degrees respectively to that of the Rt knee and without onset of Lt knee pain greater than 3/10.   ? Time 5   ? Period Weeks   ? Status New   ? Target Date 01/28/22   ? ?  ?  ? ?   ? ? ? ? ? ? ? ? Plan - 01/17/22 0942   ? ? Clinical Impression Statement Patient tolerated treatment well during today's session and appears to be improving in tolerance of weight bearing activities. Fatigu

## 2022-01-18 ENCOUNTER — Other Ambulatory Visit (HOSPITAL_COMMUNITY): Payer: Self-pay

## 2022-01-18 DIAGNOSIS — F3132 Bipolar disorder, current episode depressed, moderate: Secondary | ICD-10-CM | POA: Diagnosis not present

## 2022-01-18 LAB — URINALYSIS, COMPLETE
Bilirubin, UA: NEGATIVE
Glucose, UA: NEGATIVE
Ketones, UA: NEGATIVE
Leukocytes,UA: NEGATIVE
Nitrite, UA: NEGATIVE
Protein,UA: NEGATIVE
Specific Gravity, UA: 1.02 (ref 1.005–1.030)
Urobilinogen, Ur: 0.2 mg/dL (ref 0.2–1.0)
pH, UA: 5.5 (ref 5.0–7.5)

## 2022-01-18 LAB — MICROSCOPIC EXAMINATION

## 2022-01-19 DIAGNOSIS — F3132 Bipolar disorder, current episode depressed, moderate: Secondary | ICD-10-CM | POA: Diagnosis not present

## 2022-01-22 ENCOUNTER — Encounter (HOSPITAL_COMMUNITY): Payer: Self-pay | Admitting: Physical Therapy

## 2022-01-22 ENCOUNTER — Encounter (INDEPENDENT_AMBULATORY_CARE_PROVIDER_SITE_OTHER): Payer: Self-pay | Admitting: Family Medicine

## 2022-01-22 ENCOUNTER — Ambulatory Visit (HOSPITAL_COMMUNITY): Payer: Medicaid Other | Admitting: Physical Therapy

## 2022-01-22 ENCOUNTER — Ambulatory Visit (INDEPENDENT_AMBULATORY_CARE_PROVIDER_SITE_OTHER): Payer: Medicaid Other | Admitting: Family Medicine

## 2022-01-22 ENCOUNTER — Other Ambulatory Visit: Payer: Self-pay

## 2022-01-22 VITALS — BP 108/63 | HR 74 | Temp 97.7°F | Ht 70.0 in | Wt 265.0 lb

## 2022-01-22 DIAGNOSIS — E1165 Type 2 diabetes mellitus with hyperglycemia: Secondary | ICD-10-CM

## 2022-01-22 DIAGNOSIS — R262 Difficulty in walking, not elsewhere classified: Secondary | ICD-10-CM | POA: Diagnosis not present

## 2022-01-22 DIAGNOSIS — G8929 Other chronic pain: Secondary | ICD-10-CM

## 2022-01-22 DIAGNOSIS — Z789 Other specified health status: Secondary | ICD-10-CM

## 2022-01-22 DIAGNOSIS — Z7409 Other reduced mobility: Secondary | ICD-10-CM | POA: Diagnosis not present

## 2022-01-22 DIAGNOSIS — E669 Obesity, unspecified: Secondary | ICD-10-CM | POA: Diagnosis not present

## 2022-01-22 DIAGNOSIS — I152 Hypertension secondary to endocrine disorders: Secondary | ICD-10-CM

## 2022-01-22 DIAGNOSIS — M25562 Pain in left knee: Secondary | ICD-10-CM | POA: Diagnosis not present

## 2022-01-22 DIAGNOSIS — Z6838 Body mass index (BMI) 38.0-38.9, adult: Secondary | ICD-10-CM | POA: Diagnosis not present

## 2022-01-22 DIAGNOSIS — Z7985 Long-term (current) use of injectable non-insulin antidiabetic drugs: Secondary | ICD-10-CM | POA: Diagnosis not present

## 2022-01-22 DIAGNOSIS — E1159 Type 2 diabetes mellitus with other circulatory complications: Secondary | ICD-10-CM | POA: Diagnosis not present

## 2022-01-22 DIAGNOSIS — M25561 Pain in right knee: Secondary | ICD-10-CM | POA: Diagnosis not present

## 2022-01-22 MED ORDER — OZEMPIC (0.25 OR 0.5 MG/DOSE) 2 MG/1.5ML ~~LOC~~ SOPN: 0.5000 mg | PEN_INJECTOR | SUBCUTANEOUS | 0 refills | Status: DC

## 2022-01-22 NOTE — Therapy (Signed)
[facility] ?[address] ?[place], [place], [address] ?Phone: [phone]   Fax:  [phone] ? ?Physical Therapy Treatment ? ?Patient Details  ?Name: Jane [name] ?MRN: [id number] ?Date of Birth: 1966/02/25 ?Referring Provider (PT): Lovell Sheehan, MD ? ? ?Encounter Date: 01/22/2022 ? ? PT End of Session - 01/22/22 1013   ? ? Visit Number 7   ? Number of Visits 10   ? Date for PT Re-Evaluation 01/28/22   ? Authorization Type [place] Medicaid Healthy*   ? Authorization Time Period 10 visits approved 2/20-3/27/23   ? Authorization - Visit Number 7   ? Authorization - Number of Visits 10   ? Progress Note Due on Visit 10   ? PT Start Time [phone]   ? PT Stop Time 1032   ? PT Time Calculation (min) 40 min   ? Activity Tolerance Patient tolerated treatment well   ? Behavior During Therapy [facility] for tasks assessed/performed   ? ?  ?  ? ?  ? ? ?Past Medical History:  ?Diagnosis Date  ? ADD (attention deficit disorder)   ? Anxiety   ? Back pain   ? Bilateral swelling of feet   ? Bipolar 1 disorder ([place])   ? Bipolar 1 disorder ([place])   ? Bipolar disorder ([place])   ? Chewing difficulty   ? Chronic fatigue syndrome   ? Chronic kidney disease   ? Stage 3 kidney disease;dx by Dr. Sinda Du.   ? Constipation   ? Depression   ? Diabetes mellitus without complication ([place])   ? diet controlled  ? Dyspnea   ? with exertion  ? GERD (gastroesophageal reflux disease)   ? Headache   ? migraines  ? High cholesterol   ? History of blood clots   ? History of DVT (deep vein thrombosis)   ? left leg  ? Hypertension   ? states under control with meds., has been on med. x 2 years  ? Hypothyroidism   ? Joint pain   ? Neuropathy   ? Obsessive-compulsive disorder   ? Peripheral vascular disease ([place])   ? Prediabetes   ? Respiratory failure requiring intubation ([place])   ? Restless leg syndrome   ? Shortness of breath   ? Sleep apnea   ? Thrombocytopenia ([place]) 09/15/2019  ? Trigger thumb of left hand 01/2018  ?  Trigger thumb of right hand   ? ? ?Past Surgical History:  ?Procedure Laterality Date  ? ABDOMINAL HYSTERECTOMY  06/2016  ? complete  ? APPLICATION OF WOUND VAC Left 04/27/2019  ? Procedure: APPLICATION OF WOUND VAC LEFT GROIN;  Surgeon: Waynetta Sandy, MD;  Location: [facility];  Service: Vascular;  Laterality: Left;  ? AV FISTULA PLACEMENT Left 11/24/2018  ? Procedure: ARTERIOVENOUS (AV) FISTULA CREATION LEFT SFA TO LEFT FEMORAL VEIN;  Surgeon: Waynetta Sandy, MD;  Location: [facility];  Service: Vascular;  Laterality: Left;  ? BACK SURGERY    ? CHOLECYSTECTOMY    ? COLONOSCOPY WITH PROPOFOL N/A 11/22/2019  ? Procedure: COLONOSCOPY WITH PROPOFOL;  Surgeon: [name], MD;  Location: [facility];  Service: Gastroenterology;  Laterality: N/A;  ? FEMORAL ARTERY EXPLORATION  03/30/2019  ? Procedure: Left Common Femoral Artery and Vein Exploration;  Surgeon: Waynetta Sandy, MD;  Location: [facility];  Service: Vascular;;  ? FEMORAL-FEMORAL BYPASS GRAFT Left 11/24/2018  ? Procedure: BYPASS GRAFT FEMORAL-FEMORAL VENOUS LEFT TO RIGHT PALMA PROCEDURE USING CRYOVEIN;  Surgeon: Waynetta Sandy,  MD;  Location: [facility];  Service: Vascular;  Laterality: Left;  ? GROIN DEBRIDEMENT Left 04/27/2019  ? Procedure: GROIN DEBRIDEMENT;  Surgeon: Waynetta Sandy, MD;  Location: [facility];  Service: Vascular;  Laterality: Left;  ? INSERTION OF ILIAC STENT  03/30/2019  ? Procedure: Stent of left common, external iliac veins and left common femoral vein;  Surgeon: Waynetta Sandy, MD;  Location: [facility];  Service: Vascular;;  ? KNEE ARTHROSCOPY WITH MENISCAL REPAIR Left 11/14/2020  ? Procedure: LEFT KNEE ARTHROSCOPY WITH PARTIAL MEDIAL MENISCECTOMY;  Surgeon: Carole Civil, MD;  [facility];  Service: Orthopedics;  Laterality: Left;  ? LOWER EXTREMITY VENOGRAPHY N/A 08/17/2018  ? Procedure: LOWER EXTREMITY VENOGRAPHY - Central Venogram;  Surgeon: Waynetta Sandy, MD;  Location: [facility];  Service: Cardiovascular;  Laterality: N/A;  ? LOWER EXTREMITY VENOGRAPHY Bilateral 03/09/2019  ? Procedure: LOWER EXTREMITY VENOGRAPHY;  Surgeon: Waynetta Sandy, MD;  Location: [facility];  Service: Cardiovascular;  Laterality: Bilateral;  ? LOWER EXTREMITY VENOGRAPHY Left 08/16/2019  ? Procedure: LOWER EXTREMITY VENOGRAPHY;  Surgeon: Waynetta Sandy, MD;  Location: [facility];  Service: Cardiovascular;  Laterality: Left;  ? LUMBAR FUSION  11/21/2000  ? L5-S1  ? LUMBAR SPINE SURGERY    ? x 2 others  ? PATCH ANGIOPLASTY Left 03/30/2019  ? Procedure: Patch Angioplasty of the Left Common Femoral Vein using Venosure Biologic patch;  Surgeon: Waynetta Sandy, MD;  Location: [facility];  Service: Vascular;  Laterality: Left;  ? PERIPHERAL VASCULAR INTERVENTION Left 08/16/2019  ? Procedure: PERIPHERAL VASCULAR INTERVENTION;  Surgeon: Waynetta Sandy, MD;  Location: [place] [facility];  Service: Cardiovascular;  Laterality: Left;  common femoral/femoral vein stent  ? TRIGGER FINGER RELEASE Right 12/01/2017  ? Procedure: RELEASE TRIGGER FINGER/A-1 PULLEY RIGHT THUMB;  Surgeon: Leanora Cover, MD;  Location: [facility];  Service: Orthopedics;  Laterality: Right;  ? TRIGGER FINGER RELEASE Left 01/26/2018  ? Procedure: LEFT TRIGGER THUMB RELEASE;  Surgeon: Leanora Cover, MD;  Location: [facility];  Service: Orthopedics;  Laterality: Left;  ? ULTRASOUND GUIDANCE FOR VASCULAR ACCESS Right 03/30/2019  ? Procedure: Ultrasound-guided cannulation right internal jugular vein;  Surgeon: Waynetta Sandy, MD;  Location: [facility];  Service: Vascular;  Laterality: Right;  ? ? ?There were no vitals filed for this visit. ? ? Subjective Assessment - 01/22/22 1152   ? ? Subjective Patient reports that yesterday morning, when she was getting up out of bed, she had a very high pain occur in her Lt knee and it "buckled." She states that it has been  hurting every since and only the hinge brace that she had been wearing originally and pain medication are effective in limiting the pain. She is also taking muscle relaxents as she has been having a frequent number of muscle cramps in the LEs with the LLE being the worse of the two. She denies sensation changes, BB dysfunction, and strength deficits(beyond which what she was already limited by pain).   ? Currently in Pain? Yes   ? Pain Score 8    ? Pain Location Knee   ? Pain Orientation Left   ? ?  ?  ? ?  ? ? ? ? ? ? ? ? ? ? ? ? ? ? ? ? ? ? ? ? [facility] Adult PT Treatment/Exercise - 01/22/22 0001   ? ?  ? Knee/Hip Exercises: Aerobic  ? Recumbent Bike  x49mn   ?  ? Knee/Hip Exercises: Seated  ? Other Seated Knee/Hip Exercises Knee swings w/ 5lbs AWs x434m, seated cable resisted knee flexion #2 4x15, seated cable resisted knee extension #3   ? ?  ?  ? ?  ? ? ? ? ? ? ? ? ? ? PT Education - 01/22/22 1156   ? ? Education Details HEP, knee anatomy, and various injury healing times.   ? Person(s) Educated Patient   ? Methods Explanation   ? Comprehension Verbalized understanding   ? ?  ?  ? ?  ? ? ? ? ? ? PT Long Term Goals - 12/24/21 1335   ? ?  ? PT LONG TERM GOAL #1  ? Title Patient will achieve a Lt knee extension MMT of 5/5 and without onset of knee pain greater than 3/10.   ? Time 5   ? Period Weeks   ? Target Date 01/28/22   ?  ? PT LONG TERM GOAL #2  ? Title Patient will be able to ambulate at least 40056fith the Lt knee hinge brace and without an AD before any increase in knee pain occurs.   ? Time 5   ? Period Weeks   ? Status New   ? Target Date 01/28/22   ?  ? PT LONG TERM GOAL #3  ? Title Patient will achieve a resting level of bilateral knee pain no greater than 4/10.   ? Time 5   ? Period Weeks   ? Status New   ? Target Date 01/28/22   ?  ? PT LONG TERM GOAL #4  ? Title Patient will be independent with her HEP.   ? Time 5   ? Period Weeks   ? Target Date 01/28/22   ?  ? PT LONG TERM GOAL #5  ? Title Patient  will achieve a Lt knee flexion and extension PROM within 5 and 3 degrees respectively to that of the Rt knee and without onset of Lt knee pain greater than 3/10.   ? Time 5   ? Period Weeks   ? Status New

## 2022-01-23 ENCOUNTER — Ambulatory Visit: Payer: Medicaid Other

## 2022-01-23 DIAGNOSIS — Z86718 Personal history of other venous thrombosis and embolism: Secondary | ICD-10-CM

## 2022-01-23 DIAGNOSIS — F3132 Bipolar disorder, current episode depressed, moderate: Secondary | ICD-10-CM | POA: Diagnosis not present

## 2022-01-23 LAB — POCT INR: INR: 2.4 (ref 2.0–3.0)

## 2022-01-23 NOTE — Patient Instructions (Signed)
Description   ?Continue '5mg'$  daily except 2.'5mg'$  Wednesday.  Recheck in 4 weeks.  ? ?  ?  ?

## 2022-01-24 ENCOUNTER — Ambulatory Visit (HOSPITAL_COMMUNITY): Payer: Medicaid Other | Admitting: Physical Therapy

## 2022-01-24 ENCOUNTER — Encounter (HOSPITAL_COMMUNITY): Payer: Self-pay | Admitting: Physical Therapy

## 2022-01-24 ENCOUNTER — Other Ambulatory Visit: Payer: Self-pay

## 2022-01-24 DIAGNOSIS — R262 Difficulty in walking, not elsewhere classified: Secondary | ICD-10-CM | POA: Diagnosis not present

## 2022-01-24 DIAGNOSIS — M25561 Pain in right knee: Secondary | ICD-10-CM | POA: Diagnosis not present

## 2022-01-24 DIAGNOSIS — Z7409 Other reduced mobility: Secondary | ICD-10-CM

## 2022-01-24 DIAGNOSIS — M25562 Pain in left knee: Secondary | ICD-10-CM | POA: Diagnosis not present

## 2022-01-24 DIAGNOSIS — Z789 Other specified health status: Secondary | ICD-10-CM | POA: Diagnosis not present

## 2022-01-24 DIAGNOSIS — G8929 Other chronic pain: Secondary | ICD-10-CM | POA: Diagnosis not present

## 2022-01-24 NOTE — Progress Notes (Signed)
? ? ? ?Chief Complaint:  ? ?OBESITY ?Jane [name] is here to discuss her progress with her obesity treatment plan along with follow-up of her obesity related diagnoses. Jane [name] is on the Category 4 Plan and states she is following her eating plan approximately 75% of the time. Jane [name] states she is doing physical therapy, exercise, and walking for 15 minutes 7 times per week. ? ?Today's visit was #: 25 ?Starting weight: 283 lbs ?Starting date: 03/29/2020 ?Today's weight: 265 lbs ?Today's date: 01/22/2022 ?Total lbs lost to date: 18 lbs ?Total lbs lost since last in-office visit: 0 ? ?Interim History: Jane [name] voices that she has had some hunger/cravings for ice cream/chocolate. She is getting up at night and eating. She states Jane [name] is not helping. She did join the [facility] but water was not heated. She go a puppy yesterday. She is getting 75% of food on plan and did do increased ice cream and chocolate.  ? ?Subjective:  ? ?1. Hypertension associated with diabetes (Jane [name]) ?Jane [name]'s blood pressure is controlled today. Her last blood pressure was 134/79. She denies chest pains, chest pressure and headaches. She is currently on Zestril and amlodipine.  ? ?2. Type 2 diabetes mellitus with hyperglycemia, without long-term current use of insulin (Jane [name]) ?Jane [name] is currently on Jane [name] 0.25 mg but has significant carbohydrates cravings.  ? ?Assessment/Plan:  ? ?1. Hypertension associated with diabetes (Jane [name]) ?Jane [name] will continue current medications. She is working on healthy weight loss and exercise to improve blood pressure control. We will watch for signs of hypotension as she continues her lifestyle modifications. ? ?2. Type 2 diabetes mellitus with hyperglycemia, without long-term current use of insulin (Jane [name]) ?Jane [name] agrees to increase Jane [name] 0.5 mg subcutaneous weekly for 1 month with no refills. Good blood sugar control is important to decrease the likelihood of diabetic complications such as nephropathy, neuropathy, limb loss, blindness,  coronary artery disease, and death. Intensive lifestyle modification including diet, exercise and weight loss are the first line of treatment for diabetes.  ? ?- Semaglutide,0.25 or 0.'5MG'$ /DOS, (Jane [name], 0.25 OR 0.5 MG/DOSE,) 2 MG/1.5ML SOPN; Inject 0.5 mg into the skin once a week.  Dispense: 1.5 mL; Refill: 0 ? ?3. Obesity with current BMI of 38.1 ?Jane [name] is currently in the action stage of change. As such, her goal is to continue with weight loss efforts. She has agreed to the Category 4 Plan.  ? ?Exercise goals: All adults should avoid inactivity. Some physical activity is better than none, and adults who participate in any amount of physical activity gain some health benefits. ? ?Behavioral modification strategies: increasing lean protein intake, meal planning and cooking strategies, keeping healthy foods in the home, and planning for success. ? ?Jane [name] has agreed to follow-up with our clinic in 3-4 weeks. She was informed of the importance of frequent follow-up visits to maximize her success with intensive lifestyle modifications for her multiple health conditions.  ? ?Objective:  ? ?Blood pressure 108/63, pulse 74, temperature 97.7 ?F (36.5 ?C), height '5\' 10"'$  (1.778 m), weight 265 lb (120.2 kg), SpO2 97 %. ?Body mass index is 38.02 kg/m?. ? ?General: Cooperative, alert, well developed, in no acute distress. ?HEENT: Conjunctivae and lids unremarkable. ?Cardiovascular: Regular rhythm.  ?Lungs: Normal work of breathing. ?Neurologic: No focal deficits.  ? ?Lab Results  ?Component Value Date  ? CREATININE 1.23 (H) 04/12/2021  ? BUN 33 (H) 04/12/2021  ? NA 140 04/12/2021  ? K 3.8 04/12/2021  ? CL 101 04/12/2021  ? CO2 32 04/12/2021  ? ?Lab Results  ?  Component Value Date  ? ALT 20 04/12/2021  ? AST 17 04/12/2021  ? ALKPHOS 95 04/12/2021  ? BILITOT 0.6 04/12/2021  ? ?Lab Results  ?Component Value Date  ? HGBA1C 6.2 (H) 11/06/2021  ? HGBA1C 6.3 (H) 03/22/2021  ? HGBA1C 5.8 (A) 10/24/2020  ? HGBA1C 6.2 (H) 03/29/2020  ?  HGBA1C 6.3 (H) 10/25/2019  ? ?Lab Results  ?Component Value Date  ? INSULIN 21.5 03/29/2020  ? ?Lab Results  ?Component Value Date  ? TSH 0.107 (L) 05/18/2020  ? ?Lab Results  ?Component Value Date  ? CHOL 191 11/06/2021  ? HDL 38 (L) 11/06/2021  ? LDLCALC 116 (H) 11/06/2021  ? TRIG 187 (H) 11/06/2021  ? CHOLHDL 5.0 11/06/2021  ? ?Lab Results  ?Component Value Date  ? VD25OH 50.60 01/01/2021  ? VD25OH 45.30 10/17/2020  ? VD25OH 37.20 07/18/2020  ? ?Lab Results  ?Component Value Date  ? WBC 9.3 08/16/2021  ? HGB 15.2 (H) 08/16/2021  ? HCT 45.3 08/16/2021  ? MCV 93.8 08/16/2021  ? PLT 156 08/16/2021  ? ?Lab Results  ?Component Value Date  ? IRON 93 08/16/2021  ? TIBC 348 08/16/2021  ? FERRITIN 137 08/16/2021  ? ?Attestation Statements:  ? ?Reviewed by clinician on day of visit: allergies, medications, problem list, medical history, surgical history, family history, social history, and previous encounter notes. ? ?I, Lizbeth Bark, RMA, am acting as transcriptionist for Coralie Common, MD.  ? ?I have reviewed the above documentation for accuracy and completeness, and I agree with the above. Coralie Common, MD ? ?

## 2022-01-24 NOTE — Therapy (Signed)
?[facility] ?[address] ?[place], [place], [address] ?Phone: [phone]   Fax:  [phone] ? ?Physical Therapy Treatment ? ?Patient Details  ?Name: Jane [name] ?MRN: [id number] ?Date of Birth: 1966-04-22 ?Referring Provider (PT): Lovell Sheehan, MD ? ? ?Encounter Date: 01/24/2022 ? ? PT End of Session - 01/24/22 1015   ? ? Visit Number 8   ? Number of Visits 10   ? Date for PT Re-Evaluation 01/28/22   ? Authorization Type [facility] Medicaid Healthy*   ? Authorization Time Period 10 visits approved 2/20-3/27/23   ? Authorization - Visit Number 8   ? Authorization - Number of Visits 10   ? Progress Note Due on Visit 10   ? PT Start Time 3500   ? PT Stop Time 1029   ? PT Time Calculation (min) 44 min   ? Activity Tolerance Patient tolerated treatment well   ? Behavior During Therapy [facility] for tasks assessed/performed   ? ?  ?  ? ?  ? ? ?Past Medical History:  ?Diagnosis Date  ? ADD (attention deficit disorder)   ? Anxiety   ? Back pain   ? Bilateral swelling of feet   ? Bipolar 1 disorder ([place])   ? Bipolar 1 disorder ([place])   ? Bipolar disorder ([place])   ? Chewing difficulty   ? Chronic fatigue syndrome   ? Chronic kidney disease   ? Stage 3 kidney disease;dx by Dr. Sinda Du.   ? Constipation   ? Depression   ? Diabetes mellitus without complication ([place])   ? diet controlled  ? Dyspnea   ? with exertion  ? GERD (gastroesophageal reflux disease)   ? Headache   ? migraines  ? High cholesterol   ? History of blood clots   ? History of DVT (deep vein thrombosis)   ? left leg  ? Hypertension   ? states under control with meds., has been on med. x 2 years  ? Hypothyroidism   ? Joint pain   ? Neuropathy   ? Obsessive-compulsive disorder   ? Peripheral vascular disease ([place])   ? Prediabetes   ? Respiratory failure requiring intubation ([place])   ? Restless leg syndrome   ? Shortness of breath   ? Sleep apnea   ? Thrombocytopenia ([place]) 09/15/2019  ? Trigger thumb of left hand 01/2018  ?  Trigger thumb of right hand   ? ? ?Past Surgical History:  ?Procedure Laterality Date  ? ABDOMINAL HYSTERECTOMY  06/2016  ? complete  ? APPLICATION OF WOUND VAC Left 04/27/2019  ? Procedure: APPLICATION OF WOUND VAC LEFT GROIN;  Surgeon: Waynetta Sandy, MD;  Location: [facility];  Service: Vascular;  Laterality: Left;  ? AV FISTULA PLACEMENT Left 11/24/2018  ? Procedure: ARTERIOVENOUS (AV) FISTULA CREATION LEFT SFA TO LEFT FEMORAL VEIN;  Surgeon: Waynetta Sandy, MD;  Location: [facility];  Service: Vascular;  Laterality: Left;  ? BACK SURGERY    ? CHOLECYSTECTOMY    ? COLONOSCOPY WITH PROPOFOL N/A 11/22/2019  ? Procedure: COLONOSCOPY WITH PROPOFOL;  Surgeon: [name], MD;  Location: [facility] ENDOSCOPY;  Service: Gastroenterology;  Laterality: N/A;  ? FEMORAL ARTERY EXPLORATION  03/30/2019  ? Procedure: Left Common Femoral Artery and Vein Exploration;  Surgeon: Waynetta Sandy, MD;  Location: [facility];  Service: Vascular;;  ? FEMORAL-FEMORAL BYPASS GRAFT Left 11/24/2018  ? Procedure: BYPASS GRAFT FEMORAL-FEMORAL VENOUS LEFT TO RIGHT PALMA PROCEDURE USING CRYOVEIN;  Surgeon: Waynetta Sandy,  MD;  Location: [facility];  Service: Vascular;  Laterality: Left;  ? GROIN DEBRIDEMENT Left 04/27/2019  ? Procedure: GROIN DEBRIDEMENT;  Surgeon: Waynetta Sandy, MD;  Location: [facility];  Service: Vascular;  Laterality: Left;  ? INSERTION OF ILIAC STENT  03/30/2019  ? Procedure: Stent of left common, external iliac veins and left common femoral vein;  Surgeon: Waynetta Sandy, MD;  Location: [facility];  Service: Vascular;;  ? KNEE ARTHROSCOPY WITH MENISCAL REPAIR Left 11/14/2020  ? Procedure: LEFT KNEE ARTHROSCOPY WITH PARTIAL MEDIAL MENISCECTOMY;  Surgeon: Carole Civil, MD;  [facility]: [facility];  Service: Orthopedics;  Laterality: Left;  ? LOWER EXTREMITY VENOGRAPHY N/A 08/17/2018  ? Procedure: LOWER EXTREMITY VENOGRAPHY - Central Venogram;  Surgeon: Waynetta Sandy, MD;  Location: [facility];  Service: Cardiovascular;  Laterality: N/A;  ? LOWER EXTREMITY VENOGRAPHY Bilateral 03/09/2019  ? Procedure: LOWER EXTREMITY VENOGRAPHY;  Surgeon: Waynetta Sandy, MD;  Location: [facility];  Service: Cardiovascular;  Laterality: Bilateral;  ? LOWER EXTREMITY VENOGRAPHY Left 08/16/2019  ? Procedure: LOWER EXTREMITY VENOGRAPHY;  Surgeon: Waynetta Sandy, MD;  Location: [facility];  Service: Cardiovascular;  Laterality: Left;  ? LUMBAR FUSION  11/21/2000  ? L5-S1  ? LUMBAR SPINE SURGERY    ? x 2 others  ? PATCH ANGIOPLASTY Left 03/30/2019  ? Procedure: Patch Angioplasty of the Left Common Femoral Vein using Venosure Biologic patch;  Surgeon: Waynetta Sandy, MD;  Location: [facility];  Service: Vascular;  Laterality: Left;  ? PERIPHERAL VASCULAR INTERVENTION Left 08/16/2019  ? Procedure: PERIPHERAL VASCULAR INTERVENTION;  Surgeon: Waynetta Sandy, MD;  Location: [facility];  Service: Cardiovascular;  Laterality: Left;  common femoral/femoral vein stent  ? TRIGGER FINGER RELEASE Right 12/01/2017  ? Procedure: RELEASE TRIGGER FINGER/A-1 PULLEY RIGHT THUMB;  Surgeon: Leanora Cover, MD;  Location: [facility];  Service: Orthopedics;  Laterality: Right;  ? TRIGGER FINGER RELEASE Left 01/26/2018  ? Procedure: LEFT TRIGGER THUMB RELEASE;  Surgeon: Leanora Cover, MD;  Location: [facility];  Service: Orthopedics;  Laterality: Left;  ? ULTRASOUND GUIDANCE FOR VASCULAR ACCESS Right 03/30/2019  ? Procedure: Ultrasound-guided cannulation right internal jugular vein;  Surgeon: Waynetta Sandy, MD;  Location: [facility];  Service: Vascular;  Laterality: Right;  ? ? ?There were no vitals filed for this visit. ? ? Subjective Assessment - 01/24/22 1044   ? ? Subjective Patient reports that her leg consistently swells by the afternoon each day, but reduces over night. The increase in knee pain is the same as the previous sesison  according to the patient, but she has learned which movements cause a worsening so the average pain is not as bad. She did try to drink Body Armour, as recommended, in order to see if the BLE cramping was due to an electrolyte imbalance, but this reportedly had no effect. She says still has the groin pain as well as the lateral bilateral knee pain.   ? Pertinent History x2 femoral artery stents and past DVTs(currently on coumadin)   ? Currently in Pain? Yes   ? Pain Score 6    ? Pain Location Knee   ? Pain Orientation Left   ? ?  ?  ? ?  ? ? ? ? ? [facility] PT Assessment - 01/24/22 0001   ? ?  ? AROM  ? Right Knee Extension -8   ? Right Knee Flexion 128   ? Left  Knee Extension -8   ? Left Knee Flexion 124   ?  ? Strength  ? Right Hip Flexion 5/5   ? Right Hip ABduction 5/5   ? Right Hip ADduction 5/5   ? Left Hip Flexion 4+/5   ? Left Hip ABduction 5/5   ? Left Hip ADduction 5/5   ? Right Knee Flexion 5/5   ? Right Knee Extension 5/5   ? Left Knee Flexion 5/5   medial joint line pain  ? Left Knee Extension 5/5   medial joint line pain  ? ?  ?  ? ?  ? ? ? ? ? ? ? ? ? ? ? ? ? ? ? ? [facility] Adult PT Treatment/Exercise - 01/24/22 0001   ? ?  ? Knee/Hip Exercises: Aerobic  ? Nustep x56mn   ?  ? Knee/Hip Exercises: Seated  ? Other Seated Knee/Hip Exercises Knee swings w/ 10lbs AWs x429m, seated hamstring curl(cybex) #4 3x18   ? ?  ?  ? ?  ? ? ? ? ? ? ? ? ? ? PT Education - 01/24/22 1048   ? ? Education Details In depth discussion of POC and referrals to either her PCP or a spine specialist.   ? Person(s) Educated Patient   ? Methods Explanation   ? Comprehension Verbalized understanding   ? ?  ?  ? ?  ? ? ? ? ? ? PT Long Term Goals - 12/24/21 1335   ? ?  ? PT LONG TERM GOAL #1  ? Title Patient will achieve a Lt knee extension MMT of 5/5 and without onset of knee pain greater than 3/10.   ? Time 5   ? Period Weeks   ? Target Date 01/28/22   ?  ? PT LONG TERM GOAL #2  ? Title Patient will be able to ambulate at least 40074fith the  Lt knee hinge brace and without an AD before any increase in knee pain occurs.   ? Time 5   ? Period Weeks   ? Status New   ? Target Date 01/28/22   ?  ? PT LONG TERM GOAL #3  ? Title Patient will achieve a r

## 2022-01-25 ENCOUNTER — Other Ambulatory Visit: Payer: Self-pay | Admitting: Family Medicine

## 2022-01-25 DIAGNOSIS — F3132 Bipolar disorder, current episode depressed, moderate: Secondary | ICD-10-CM | POA: Diagnosis not present

## 2022-01-26 DIAGNOSIS — F3132 Bipolar disorder, current episode depressed, moderate: Secondary | ICD-10-CM | POA: Diagnosis not present

## 2022-01-28 DIAGNOSIS — E039 Hypothyroidism, unspecified: Secondary | ICD-10-CM | POA: Diagnosis not present

## 2022-01-29 ENCOUNTER — Encounter (HOSPITAL_COMMUNITY): Payer: Self-pay | Admitting: Physical Therapy

## 2022-01-29 ENCOUNTER — Other Ambulatory Visit: Payer: Self-pay

## 2022-01-29 ENCOUNTER — Ambulatory Visit (HOSPITAL_COMMUNITY): Payer: Medicaid Other | Admitting: Physical Therapy

## 2022-01-29 DIAGNOSIS — M25562 Pain in left knee: Secondary | ICD-10-CM | POA: Diagnosis not present

## 2022-01-29 DIAGNOSIS — M25561 Pain in right knee: Secondary | ICD-10-CM | POA: Diagnosis not present

## 2022-01-29 DIAGNOSIS — Z7409 Other reduced mobility: Secondary | ICD-10-CM | POA: Diagnosis not present

## 2022-01-29 DIAGNOSIS — G8929 Other chronic pain: Secondary | ICD-10-CM

## 2022-01-29 DIAGNOSIS — Z789 Other specified health status: Secondary | ICD-10-CM | POA: Diagnosis not present

## 2022-01-29 DIAGNOSIS — R262 Difficulty in walking, not elsewhere classified: Secondary | ICD-10-CM

## 2022-01-29 NOTE — Patient Instructions (Signed)
-   Seated Hamstring Curl with Anchored Resistance  - 2-3 x daily - 7 x weekly - 3 sets - 12 reps ?

## 2022-01-29 NOTE — Therapy (Signed)
[facility] ?[address] ?[place], [place], [address] ?Phone: [phone]   Fax:  [phone] ? ?Physical Therapy Discharge ? ?Patient Details  ?Name: Jane [name] ?MRN: [id number] ?Date of Birth: 1966/05/06 ?Referring Provider (PT): Lovell Sheehan, MD ? ? ?Encounter Date: 01/29/2022 ? ?PHYSICAL THERAPY DISCHARGE SUMMARY ? ?Visits from Start of Care: 9 ? ?Current functional level related to goals / functional outcomes: ?See note ?  ?Remaining deficits: ?See note ?  ?Education / Equipment: ?See note  ? ?Patient agrees to discharge. Patient goals were not met. Patient is being discharged due to did not respond to therapy.   ? ? PT End of Session - 01/29/22 8416   ? ? Visit Number 9   ? Number of Visits 10   ? Date for PT Re-Evaluation 01/28/22   ? Authorization Type [place] Medicaid Healthy*   ? Authorization Time Period 10 visits approved 2/20-3/27/23   ? Authorization - Visit Number 9   ? Authorization - Number of Visits 10   ? Progress Note Due on Visit 10   ? PT Start Time [phone]   ? PT Stop Time 1021   ? PT Time Calculation (min) 33 min   ? Activity Tolerance Patient tolerated treatment well   ? Behavior During Therapy [facility] for tasks assessed/performed   ? ?  ?  ? ?  ? ? ?Past Medical History:  ?Diagnosis Date  ? ADD (attention deficit disorder)   ? Anxiety   ? Back pain   ? Bilateral swelling of feet   ? Bipolar 1 disorder ([place])   ? Bipolar 1 disorder ([place])   ? Bipolar disorder ([place])   ? Chewing difficulty   ? Chronic fatigue syndrome   ? Chronic kidney disease   ? Stage 3 kidney disease;dx by Dr. Sinda Du.   ? Constipation   ? Depression   ? Diabetes mellitus without complication ([place])   ? diet controlled  ? Dyspnea   ? with exertion  ? GERD (gastroesophageal reflux disease)   ? Headache   ? migraines  ? High cholesterol   ? History of blood clots   ? History of DVT (deep vein thrombosis)   ? left leg  ? Hypertension   ? states under control with meds., has been on  med. x 2 years  ? Hypothyroidism   ? Joint pain   ? Neuropathy   ? Obsessive-compulsive disorder   ? Peripheral vascular disease ([place])   ? Prediabetes   ? Respiratory failure requiring intubation ([place])   ? Restless leg syndrome   ? Shortness of breath   ? Sleep apnea   ? Thrombocytopenia ([place]) 09/15/2019  ? Trigger thumb of left hand 01/2018  ? Trigger thumb of right hand   ? ? ?Past Surgical History:  ?Procedure Laterality Date  ? ABDOMINAL HYSTERECTOMY  06/2016  ? complete  ? APPLICATION OF WOUND VAC Left 04/27/2019  ? Procedure: APPLICATION OF WOUND VAC LEFT GROIN;  Surgeon: Waynetta Sandy, MD;  Location: [facility];  Service: Vascular;  Laterality: Left;  ? AV FISTULA PLACEMENT Left 11/24/2018  ? Procedure: ARTERIOVENOUS (AV) FISTULA CREATION LEFT SFA TO LEFT FEMORAL VEIN;  Surgeon: Waynetta Sandy, MD;  Location: [facility];  Service: Vascular;  Laterality: Left;  ? BACK SURGERY    ? CHOLECYSTECTOMY    ? COLONOSCOPY WITH PROPOFOL N/A 11/22/2019  ? Procedure: COLONOSCOPY WITH PROPOFOL;  Surgeon: [name], MD;  [facility];  Service: Gastroenterology;  Laterality: N/A;  ? FEMORAL ARTERY EXPLORATION  03/30/2019  ? Procedure: Left Common Femoral Artery and Vein Exploration;  Surgeon: Waynetta Sandy, MD;  Location: [facility];  Service: Vascular;;  ? FEMORAL-FEMORAL BYPASS GRAFT Left 11/24/2018  ? Procedure: BYPASS GRAFT FEMORAL-FEMORAL VENOUS LEFT TO RIGHT PALMA PROCEDURE USING CRYOVEIN;  Surgeon: Waynetta Sandy, MD;  Location: [facility];  Service: Vascular;  Laterality: Left;  ? GROIN DEBRIDEMENT Left 04/27/2019  ? Procedure: GROIN DEBRIDEMENT;  Surgeon: Waynetta Sandy, MD;  Location: [facility];  Service: Vascular;  Laterality: Left;  ? INSERTION OF ILIAC STENT  03/30/2019  ? Procedure: Stent of left common, external iliac veins and left common femoral vein;  Surgeon: Waynetta Sandy, MD;  Location: [facility];  Service: Vascular;;  ? KNEE ARTHROSCOPY WITH MENISCAL  REPAIR Left 11/14/2020  ? Procedure: LEFT KNEE ARTHROSCOPY WITH PARTIAL MEDIAL MENISCECTOMY;  Surgeon: Carole Civil, MD;  [facility]: [facility];  Service: Orthopedics;  Laterality: Left;  ? LOWER EXTREMITY VENOGRAPHY N/A 08/17/2018  ? Procedure: LOWER EXTREMITY VENOGRAPHY - Central Venogram;  Surgeon: Waynetta Sandy, MD;  Location: [facility];  Service: Cardiovascular;  Laterality: N/A;  ? LOWER EXTREMITY VENOGRAPHY Bilateral 03/09/2019  ? Procedure: LOWER EXTREMITY VENOGRAPHY;  Surgeon: Waynetta Sandy, MD;  Location: [facility];  Service: Cardiovascular;  Laterality: Bilateral;  ? LOWER EXTREMITY VENOGRAPHY Left 08/16/2019  ? Procedure: LOWER EXTREMITY VENOGRAPHY;  Surgeon: Waynetta Sandy, MD;  Location: [facility];  Service: Cardiovascular;  Laterality: Left;  ? LUMBAR FUSION  11/21/2000  ? L5-S1  ? LUMBAR SPINE SURGERY    ? x 2 others  ? PATCH ANGIOPLASTY Left 03/30/2019  ? Procedure: Patch Angioplasty of the Left Common Femoral Vein using Venosure Biologic patch;  Surgeon: Waynetta Sandy, MD;  Location: [facility];  Service: Vascular;  Laterality: Left;  ? PERIPHERAL VASCULAR INTERVENTION Left 08/16/2019  ? Procedure: PERIPHERAL VASCULAR INTERVENTION;  Surgeon: Waynetta Sandy, MD;  Location: [facility];  Service: Cardiovascular;  Laterality: Left;  common femoral/femoral vein stent  ? TRIGGER FINGER RELEASE Right 12/01/2017  ? Procedure: RELEASE TRIGGER FINGER/A-1 PULLEY RIGHT THUMB;  Surgeon: Leanora Cover, MD;  Location: [facility];  Service: Orthopedics;  Laterality: Right;  ? TRIGGER FINGER RELEASE Left 01/26/2018  ? Procedure: LEFT TRIGGER THUMB RELEASE;  Surgeon: Leanora Cover, MD;  Location: [facility];  Service: Orthopedics;  Laterality: Left;  ? ULTRASOUND GUIDANCE FOR VASCULAR ACCESS Right 03/30/2019  ? Procedure: Ultrasound-guided cannulation right internal jugular vein;  Surgeon: Waynetta Sandy, MD;  Location: [facility];  Service: Vascular;  Laterality: Right;  ? ? ?There were no vitals filed for this visit. ? ? Subjective Assessment - 01/29/22 1027   ? ? Subjective Patient reports that she is content with being discharged today. As she has not issues with knee strength and the only remaining issue is her resting and active pain levels which therapy has not been successful in reducing. She states that she does intend to continue with her exercises at home as well as water aerobics once they have the pool temperature fixed.   ? Pertinent History x2 femoral artery stents and past DVTs(currently on coumadin)   ? Currently in Pain? Yes   ? Pain Score 7    ? Pain Location Knee   ? Pain Orientation Left   ? Pain Descriptors / Indicators Constant   ? Pain Type Chronic  pain   ? ?  ?  ? ?  ? ? ? ? ? [facility] PT Assessment - 01/29/22 0001   ? ?  ? AROM  ? Right Knee Extension -6   ? Right Knee Flexion 132   ? Left Knee Extension -7   ? Left Knee Flexion 133   ?  ? Strength  ? Right Hip Flexion 5/5   ? Left Hip Flexion 5/5   ? Right Knee Flexion 5/5   ? Right Knee Extension 5/5   ? Left Knee Flexion 5/5   ? Left Knee Extension 5/5   ? ?  ?  ? ?  ? ? ? ? ? ? ? ? ? ? ? ? ? ? ? ? [facility] Adult PT Treatment/Exercise - 01/29/22 0001   ? ?  ? Knee/Hip Exercises: Aerobic  ? Nustep x38min   ? ?  ?  ? ?  ? ? ? ? ? ? ? ? ? ? ? ? ? ? ? PT Long Term Goals - 01/29/22 1010   ? ?  ? PT LONG TERM GOAL #1  ? Title Patient will achieve a Lt knee extension MMT of 5/5 and without onset of knee pain greater than 3/10.   ? Time 5   ? Period Weeks   ? Status Not Met   ? Target Date 01/28/22   ?  ? PT LONG TERM GOAL #2  ? Title Patient will be able to ambulate at least 471ft with the Lt knee hinge brace and without an AD before any increase in knee pain occurs.   ? Time 5   ? Period Weeks   ? Status Not Met   ? Target Date 01/28/22   ?  ? PT LONG TERM GOAL #3  ? Title Patient will achieve a resting level of bilateral knee pain no greater  than 4/10.   ? Time 5   ? Period Weeks   ? Status Not Met   ? Target Date 01/28/22   ?  ? PT LONG TERM GOAL #4  ? Title Patient will be independent with her HEP.   ? Time 5   ? Period Weeks   ? Status Achieved

## 2022-01-31 ENCOUNTER — Encounter (HOSPITAL_COMMUNITY): Payer: Medicaid Other | Admitting: Physical Therapy

## 2022-01-31 ENCOUNTER — Other Ambulatory Visit: Payer: Self-pay | Admitting: Family Medicine

## 2022-02-02 DIAGNOSIS — F3132 Bipolar disorder, current episode depressed, moderate: Secondary | ICD-10-CM | POA: Diagnosis not present

## 2022-02-08 DIAGNOSIS — F3132 Bipolar disorder, current episode depressed, moderate: Secondary | ICD-10-CM | POA: Diagnosis not present

## 2022-02-09 DIAGNOSIS — F3132 Bipolar disorder, current episode depressed, moderate: Secondary | ICD-10-CM | POA: Diagnosis not present

## 2022-02-13 DIAGNOSIS — F3132 Bipolar disorder, current episode depressed, moderate: Secondary | ICD-10-CM | POA: Diagnosis not present

## 2022-02-15 DIAGNOSIS — F3132 Bipolar disorder, current episode depressed, moderate: Secondary | ICD-10-CM | POA: Diagnosis not present

## 2022-02-16 DIAGNOSIS — F3132 Bipolar disorder, current episode depressed, moderate: Secondary | ICD-10-CM | POA: Diagnosis not present

## 2022-02-18 ENCOUNTER — Encounter (HOSPITAL_COMMUNITY): Payer: Self-pay | Admitting: Hematology

## 2022-02-18 ENCOUNTER — Other Ambulatory Visit (HOSPITAL_COMMUNITY): Payer: Self-pay

## 2022-02-18 ENCOUNTER — Ambulatory Visit (INDEPENDENT_AMBULATORY_CARE_PROVIDER_SITE_OTHER): Payer: Medicaid Other | Admitting: Family Medicine

## 2022-02-18 ENCOUNTER — Encounter (INDEPENDENT_AMBULATORY_CARE_PROVIDER_SITE_OTHER): Payer: Self-pay | Admitting: Family Medicine

## 2022-02-18 VITALS — BP 132/71 | HR 60 | Temp 97.8°F | Ht 70.0 in | Wt 254.0 lb

## 2022-02-18 DIAGNOSIS — I152 Hypertension secondary to endocrine disorders: Secondary | ICD-10-CM

## 2022-02-18 DIAGNOSIS — E1159 Type 2 diabetes mellitus with other circulatory complications: Secondary | ICD-10-CM | POA: Diagnosis not present

## 2022-02-18 DIAGNOSIS — E669 Obesity, unspecified: Secondary | ICD-10-CM

## 2022-02-18 DIAGNOSIS — Z7985 Long-term (current) use of injectable non-insulin antidiabetic drugs: Secondary | ICD-10-CM

## 2022-02-18 DIAGNOSIS — Z6836 Body mass index (BMI) 36.0-36.9, adult: Secondary | ICD-10-CM

## 2022-02-18 DIAGNOSIS — E1165 Type 2 diabetes mellitus with hyperglycemia: Secondary | ICD-10-CM | POA: Diagnosis not present

## 2022-02-18 MED ORDER — OZEMPIC (0.25 OR 0.5 MG/DOSE) 2 MG/1.5ML ~~LOC~~ SOPN
0.5000 mg | PEN_INJECTOR | SUBCUTANEOUS | 0 refills | Status: DC
  Filled 2022-02-18: qty 1.5, 28d supply, fill #0

## 2022-02-20 ENCOUNTER — Other Ambulatory Visit (HOSPITAL_COMMUNITY): Payer: Self-pay

## 2022-02-20 DIAGNOSIS — F3132 Bipolar disorder, current episode depressed, moderate: Secondary | ICD-10-CM | POA: Diagnosis not present

## 2022-02-21 ENCOUNTER — Inpatient Hospital Stay (HOSPITAL_COMMUNITY): Payer: Medicaid Other | Attending: Hematology

## 2022-02-21 DIAGNOSIS — R5383 Other fatigue: Secondary | ICD-10-CM

## 2022-02-21 DIAGNOSIS — Z90710 Acquired absence of both cervix and uterus: Secondary | ICD-10-CM | POA: Insufficient documentation

## 2022-02-21 DIAGNOSIS — I82402 Acute embolism and thrombosis of unspecified deep veins of left lower extremity: Secondary | ICD-10-CM | POA: Insufficient documentation

## 2022-02-21 DIAGNOSIS — D509 Iron deficiency anemia, unspecified: Secondary | ICD-10-CM | POA: Insufficient documentation

## 2022-02-21 DIAGNOSIS — E1122 Type 2 diabetes mellitus with diabetic chronic kidney disease: Secondary | ICD-10-CM | POA: Diagnosis not present

## 2022-02-21 DIAGNOSIS — I129 Hypertensive chronic kidney disease with stage 1 through stage 4 chronic kidney disease, or unspecified chronic kidney disease: Secondary | ICD-10-CM | POA: Diagnosis not present

## 2022-02-21 DIAGNOSIS — D631 Anemia in chronic kidney disease: Secondary | ICD-10-CM | POA: Diagnosis not present

## 2022-02-21 DIAGNOSIS — N183 Chronic kidney disease, stage 3 unspecified: Secondary | ICD-10-CM | POA: Insufficient documentation

## 2022-02-21 LAB — CBC WITH DIFFERENTIAL/PLATELET
Abs Immature Granulocytes: 0.01 10*3/uL (ref 0.00–0.07)
Basophils Absolute: 0 10*3/uL (ref 0.0–0.1)
Basophils Relative: 1 %
Eosinophils Absolute: 0.1 10*3/uL (ref 0.0–0.5)
Eosinophils Relative: 1 %
HCT: 40.8 % (ref 36.0–46.0)
Hemoglobin: 13.2 g/dL (ref 12.0–15.0)
Immature Granulocytes: 0 %
Lymphocytes Relative: 29 %
Lymphs Abs: 1.5 10*3/uL (ref 0.7–4.0)
MCH: 29.8 pg (ref 26.0–34.0)
MCHC: 32.4 g/dL (ref 30.0–36.0)
MCV: 92.1 fL (ref 80.0–100.0)
Monocytes Absolute: 0.5 10*3/uL (ref 0.1–1.0)
Monocytes Relative: 9 %
Neutro Abs: 3.1 10*3/uL (ref 1.7–7.7)
Neutrophils Relative %: 60 %
Platelets: 152 10*3/uL (ref 150–400)
RBC: 4.43 MIL/uL (ref 3.87–5.11)
RDW: 13.5 % (ref 11.5–15.5)
WBC: 5.2 10*3/uL (ref 4.0–10.5)
nRBC: 0 % (ref 0.0–0.2)

## 2022-02-21 LAB — VITAMIN B12: Vitamin B-12: 532 pg/mL (ref 180–914)

## 2022-02-21 LAB — VITAMIN D 25 HYDROXY (VIT D DEFICIENCY, FRACTURES): Vit D, 25-Hydroxy: 47.1 ng/mL (ref 30–100)

## 2022-02-21 LAB — IRON AND TIBC
Iron: 87 ug/dL (ref 28–170)
Saturation Ratios: 29 % (ref 10.4–31.8)
TIBC: 300 ug/dL (ref 250–450)
UIBC: 213 ug/dL

## 2022-02-21 LAB — FOLATE: Folate: 40.5 ng/mL (ref >5.9)

## 2022-02-21 LAB — FERRITIN: Ferritin: 133 ng/mL (ref 11–307)

## 2022-02-22 ENCOUNTER — Ambulatory Visit: Payer: Medicaid Other | Admitting: Family Medicine

## 2022-02-22 ENCOUNTER — Encounter: Payer: Self-pay | Admitting: Family Medicine

## 2022-02-22 VITALS — BP 140/82 | HR 64 | Temp 97.9°F | Ht 70.0 in | Wt 259.5 lb

## 2022-02-22 DIAGNOSIS — G894 Chronic pain syndrome: Secondary | ICD-10-CM | POA: Diagnosis not present

## 2022-02-22 DIAGNOSIS — M792 Neuralgia and neuritis, unspecified: Secondary | ICD-10-CM

## 2022-02-22 DIAGNOSIS — M5136 Other intervertebral disc degeneration, lumbar region: Secondary | ICD-10-CM | POA: Diagnosis not present

## 2022-02-22 DIAGNOSIS — E1142 Type 2 diabetes mellitus with diabetic polyneuropathy: Secondary | ICD-10-CM

## 2022-02-22 DIAGNOSIS — F3132 Bipolar disorder, current episode depressed, moderate: Secondary | ICD-10-CM | POA: Diagnosis not present

## 2022-02-22 MED ORDER — AMITRIPTYLINE HCL 25 MG PO TABS: 25.0000 mg | ORAL_TABLET | Freq: Every day | ORAL | 1 refills | Status: DC

## 2022-02-22 MED ORDER — PREGABALIN 100 MG PO CAPS: 100.0000 mg | ORAL_CAPSULE | Freq: Four times a day (QID) | ORAL | 1 refills | Status: DC

## 2022-02-22 NOTE — Progress Notes (Signed)
? ?[facility] ?[address] ?[place], [place] [address] ? ? ?CLINIC:  ?Medical Oncology/Hematology ? ?PCP:  ?[name], MD ?[address] ?[place] [address] ?[phone] ? ? ?REASON FOR VISIT:  ?Follow-up for iron deficiency anemia ?  ?CURRENT THERAPY: Intermittent iron infusions (most recent [place] on 05/11/2021) ? ?INTERVAL HISTORY:  ?[name] 56 y.o. female returns for routine follow-up of her iron deficiency anemia.  She was last seen by Tarri Abernethy PA-C on 08/23/2021. ? ?At today's visit, she reports feeling fairly well.  No recent hospitalizations, surgeries, or changes in baseline health status. ? ?She has not had any DVT or PE since our last visit.  She continues to take Coumadin as prescribed and has her INR checked regularly by her PCP.  She continues to follow with vascular specialist on an annual basis. ? ?She has not noticed any recent bleeding such as bright red blood per rectum, melena, or epistaxis.  She continues to have intermittent fatigue, but this is somewhat improved from before after she has lost weight.  She has chronic restless legs and headaches.  She denies any pica, chest pain, dyspnea on exertion, lightheadedness, or syncope. ? ?She has 75% energy and 100% appetite. She has been using diet and exercise to lose weight, and has lost about 30 pounds since her last visit. ? ? ?REVIEW OF SYSTEMS:  ?Review of Systems  ?Constitutional:  Positive for fatigue. Negative for appetite change, chills, diaphoresis, fever and unexpected weight change.  ?HENT:   Negative for lump/mass and nosebleeds.   ?Eyes:  Negative for eye problems.  ?Respiratory:  Negative for cough, hemoptysis and shortness of breath.   ?Cardiovascular:  Negative for chest pain, leg swelling and palpitations.  ?Gastrointestinal:  Negative for abdominal pain, blood in stool, constipation, diarrhea, nausea and vomiting.  ?Genitourinary:  Negative for hematuria.   ?Musculoskeletal:   Positive for arthralgias and back pain.  ?Skin: Negative.   ?Neurological:  Positive for headaches and numbness. Negative for dizziness and light-headedness.  ?Hematological:  Does not bruise/bleed easily.   ? ? ?PAST MEDICAL/SURGICAL HISTORY:  ?Past Medical History:  ?Diagnosis Date  ? ADD (attention deficit disorder)   ? Anxiety   ? Back pain   ? Bilateral swelling of feet   ? Bipolar 1 disorder ([place])   ? Bipolar 1 disorder ([place])   ? Bipolar disorder ([place])   ? Chewing difficulty   ? Chronic fatigue syndrome   ? Chronic kidney disease   ? Stage 3 kidney disease;dx by Dr. Sinda Du.   ? Constipation   ? Depression   ? Diabetes mellitus without complication ([place])   ? diet controlled  ? Dyspnea   ? with exertion  ? GERD (gastroesophageal reflux disease)   ? Headache   ? migraines  ? High cholesterol   ? History of blood clots   ? History of DVT (deep vein thrombosis)   ? left leg  ? Hypertension   ? states under control with meds., has been on med. x 2 years  ? Hypothyroidism   ? Joint pain   ? Neuropathy   ? Obsessive-compulsive disorder   ? Peripheral vascular disease ([place])   ? Prediabetes   ? Respiratory failure requiring intubation ([place])   ? Restless leg syndrome   ? Shortness of breath   ? Sleep apnea   ? Thrombocytopenia ([place]) 09/15/2019  ? Trigger thumb of left hand 01/2018  ? Trigger thumb of right hand   ? ?  Past Surgical History:  ?Procedure Laterality Date  ? ABDOMINAL HYSTERECTOMY  06/2016  ? complete  ? APPLICATION OF WOUND VAC Left 04/27/2019  ? Procedure: APPLICATION OF WOUND VAC LEFT GROIN;  Surgeon: Waynetta Sandy, MD;  Location: [facility];  Service: Vascular;  Laterality: Left;  ? AV FISTULA PLACEMENT Left 11/24/2018  ? Procedure: ARTERIOVENOUS (AV) FISTULA CREATION LEFT SFA TO LEFT FEMORAL VEIN;  Surgeon: Waynetta Sandy, MD;  Location: [facility];  Service: Vascular;  Laterality: Left;  ? BACK SURGERY    ? CHOLECYSTECTOMY    ? COLONOSCOPY WITH PROPOFOL N/A 11/22/2019  ? Procedure:  COLONOSCOPY WITH PROPOFOL;  Surgeon: [name], MD;  [facility]: [facility]COPY;  Service: Gastroenterology;  Laterality: N/A;  ? FEMORAL ARTERY EXPLORATION  03/30/2019  ? Procedure: Left Common Femoral Artery and Vein Exploration;  Surgeon: Waynetta Sandy, MD;  Location: [facility];  Service: Vascular;;  ? FEMORAL-FEMORAL BYPASS GRAFT Left 11/24/2018  ? Procedure: BYPASS GRAFT FEMORAL-FEMORAL VENOUS LEFT TO RIGHT PALMA PROCEDURE USING CRYOVEIN;  Surgeon: Waynetta Sandy, MD;  Location: [facility];  Service: Vascular;  Laterality: Left;  ? GROIN DEBRIDEMENT Left 04/27/2019  ? Procedure: GROIN DEBRIDEMENT;  Surgeon: Waynetta Sandy, MD;  Location: [facility];  Service: Vascular;  Laterality: Left;  ? INSERTION OF ILIAC STENT  03/30/2019  ? Procedure: Stent of left common, external iliac veins and left common femoral vein;  Surgeon: Waynetta Sandy, MD;  Location: [facility];  Service: Vascular;;  ? KNEE ARTHROSCOPY WITH MENISCAL REPAIR Left 11/14/2020  ? Procedure: LEFT KNEE ARTHROSCOPY WITH PARTIAL MEDIAL MENISCECTOMY;  Surgeon: Carole Civil, MD;  Location: [facility];  Service: Orthopedics;  Laterality: Left;  ? LOWER EXTREMITY VENOGRAPHY N/A 08/17/2018  ? Procedure: LOWER EXTREMITY VENOGRAPHY - Central Venogram;  Surgeon: Waynetta Sandy, MD;  Location: [facility];  Service: Cardiovascular;  Laterality: N/A;  ? LOWER EXTREMITY VENOGRAPHY Bilateral 03/09/2019  ? Procedure: LOWER EXTREMITY VENOGRAPHY;  Surgeon: Waynetta Sandy, MD;  Location: [facility];  Service: Cardiovascular;  Laterality: Bilateral;  ? LOWER EXTREMITY VENOGRAPHY Left 08/16/2019  ? Procedure: LOWER EXTREMITY VENOGRAPHY;  Surgeon: Waynetta Sandy, MD;  Location: [facility];  Service: Cardiovascular;  Laterality: Left;  ? LUMBAR FUSION  11/21/2000  ? L5-S1  ? LUMBAR SPINE SURGERY    ? x 2 others  ? PATCH ANGIOPLASTY Left 03/30/2019  ? Procedure: Patch Angioplasty of the Left  Common Femoral Vein using Venosure Biologic patch;  Surgeon: Waynetta Sandy, MD;  Location: [facility];  Service: Vascular;  Laterality: Left;  ? PERIPHERAL VASCULAR INTERVENTION Left 08/16/2019  ? Procedure: PERIPHERAL VASCULAR INTERVENTION;  Surgeon: Waynetta Sandy, MD;  Location: [facility];  Service: Cardiovascular;  Laterality: Left;  common femoral/femoral vein stent  ? TRIGGER FINGER RELEASE Right 12/01/2017  ? Procedure: RELEASE TRIGGER FINGER/A-1 PULLEY RIGHT THUMB;  Surgeon: Leanora Cover, MD;  Location: [facility];  Service: Orthopedics;  Laterality: Right;  ? TRIGGER FINGER RELEASE Left 01/26/2018  ? Procedure: LEFT TRIGGER THUMB RELEASE;  Surgeon: Leanora Cover, MD;  Location: [facility];  Service: Orthopedics;  Laterality: Left;  ? ULTRASOUND GUIDANCE FOR VASCULAR ACCESS Right 03/30/2019  ? Procedure: Ultrasound-guided cannulation right internal jugular vein;  Surgeon: Waynetta Sandy, MD;  Location: [facility];  Service: Vascular;  Laterality: Right;  ? ? ? ?SOCIAL HISTORY:  ?Social History  ? ?Socioeconomic History  ? Marital status: Divorced  ?  Spouse name: Not on file  ? Number of children: 3  ? Years of education: Not on file  ? Highest education level: Not on file  ?Occupational History  ? Not on file  ?Tobacco Use  ? Smoking status: Never  ? Smokeless tobacco: Never  ?Vaping Use  ? Vaping Use: Never used  ?Substance and Sexual Activity  ? Alcohol use: No  ? Drug use: No  ? Sexual activity: Not Currently  ?  Partners: Male  ?  Birth control/protection: Surgical  ?Other Topics Concern  ? Not on file  ?Social History Narrative  ? Not on file  ? ?Social Determinants of Health  ? ?Financial Resource Strain: Not on file  ?Food Insecurity: Not on file  ?Transportation Needs: Not on file  ?Physical Activity: Not on file  ?Stress: Not on file  ?Social Connections: Not on file  ?Intimate Partner Violence: Not on file  ? ? ?FAMILY HISTORY:  ?Family  History  ?Problem Relation Age of Onset  ? Heart disease Mother   ? Hyperlipidemia Mother   ? Hypertension Mother   ? Bipolar disorder Mother   ? Stroke Mother   ? Depression Mother   ? Sleep apnea Mother   ?

## 2022-02-22 NOTE — Progress Notes (Signed)
?  ? ?I,Sha'taria Tyson,acting as a scribe for Lavon Paganini, MD.,have documented all relevant documentation on the behalf of Lavon Paganini, MD,as directed by  Lavon Paganini, MD while in the presence of Lavon Paganini, MD. ? ? ?Established patient visit ? ? ?Patient: Jane [name]   DOB: [birth year]   56 y.o. Female  MRN: [id number] ?Visit Date: 02/22/2022 ? ?Today's healthcare provider: Lavon Paganini, MD  ? ?Chief Complaint  ?Patient presents with  ? Back Pain  ? ?Subjective  ?  ?HPI  ?-Patient reports low back pain and leg pain. Reports its been going on for years but really bothering her in the last 3 months. Leg pain is typically her left leg but lately it has been both legs and lower back. Mainly her legs with cramps down to her feet. Patient reports taking norco and robaxin. States the robaxin does not do anything for her anymore. Reports she has had 3 previous back surgeries and ware she needs a knee replacement. States she previously had PT for her knee and will be going back starting May 15. Patient has already taken [place] for today. ? ?Sees Dr Holley Raring for pain management ?Did 10 wks of PT and restarts 5/15 ?Last back surgery in 2019 (Dr Carloyn Manner - retired in 2020) ? ?Has lost 30 lbs working with healthy weight and wellness ?Pain in groins and down both legs - feels like a Charlie horse at night - wakes her at night. Back hurts more with straightening ?Numbness/tingling in both legs ?+ weakness in both legs ?Unable to climb steps ? ?Lives alone ? ?Has been off of Lyrica and Amitriptyline for a few months after being unable to get refills from her podiatrist who had initially prescribed them. ? ?Medications: ?Outpatient Medications Prior to Visit  ?Medication Sig  ? ALPRAZolam (XANAX) 1 MG tablet Take 1 tablet (1 mg total) by mouth 2 (two) times daily as needed. for anxiety  ? amLODipine (NORVASC) 5 MG tablet Take 1 tablet (5 mg total) by mouth daily.  ? amphetamine-dextroamphetamine (ADDERALL)  30 MG tablet Take 1 tablet by mouth 2 (two) times daily.  ? amphetamine-dextroamphetamine (ADDERALL) 30 MG tablet Take 1 tablet by mouth 2 (two) times daily.  ? amphetamine-dextroamphetamine (ADDERALL) 30 MG tablet Take 1 tablet by mouth 2 (two) times daily.  ? aspirin EC 81 MG tablet Take 81 mg by mouth daily.  ? atorvastatin (LIPITOR) 40 MG tablet Take 1 tablet (40 mg total) by mouth daily.  ? capsaicin (ZOSTRIX) 0.025 % cream Apply topically 2 (two) times daily.  ? cefpodoxime (VANTIN) 200 MG tablet Take 200 mg by mouth 2 (two) times daily.  ? cetirizine (ZYRTEC) 10 MG tablet TAKE 1 TABLET BY MOUTH DAILY  ? conjugated estrogens (PREMARIN) vaginal cream Estrogen Cream Instruction ? ?Discard applicator ? ?Apply pea sized amount to tip of finger to urethra before bed. Wash hands well after application. ?Use Monday, Wednesday and Friday  ? cycloSPORINE (RESTASIS) 0.05 % ophthalmic emulsion Place 1 drop into both eyes 2 (two) times daily.  ? FLUoxetine (PROZAC) 20 MG capsule Take 1 capsule (20 mg total) by mouth daily.  ? FLUoxetine (PROZAC) 40 MG capsule TAKE 1 TABLET BY MOUTH DAILY  ? GARLIC PO Take 4,034 mg by mouth daily.  ? hydrochlorothiazide (HYDRODIURIL) 25 MG tablet TAKE 1 TABLET BY MOUTH DAILY Please schedule office visit BEFORE any future refills  ? HYDROcodone-acetaminophen (NORCO) 10-325 MG tablet Take 1 tablet by mouth every 6 (six) hours as needed. To last  30 days from fill date  ? [START ON 03/01/2022] HYDROcodone-acetaminophen (NORCO) 10-325 MG tablet Take 1 tablet by mouth every 6 (six) hours as needed. To last 30 days from fill date  ? levothyroxine (SYNTHROID) 125 MCG tablet Take 125 mcg by mouth daily before breakfast.  ? LINZESS 145 MCG CAPS capsule TAKE ONE CAPSULE BY MOUTH DAILY (Patient taking differently: Take 145 mcg by mouth daily as needed.)  ? lisinopril (ZESTRIL) 5 MG tablet Take 1 tablet (5 mg total) by mouth daily.  ? methocarbamol (ROBAXIN) 750 MG tablet Take 1 tablet (750 mg total) by  mouth 2 (two) times daily as needed for muscle spasms.  ? Multiple Vitamin (MULTIVITAMIN) capsule Take 1 capsule by mouth daily.  ? Olopatadine HCl 0.2 % SOLN Place 1 drop into both eyes daily.  ? Omega-3 Fatty Acids (OMEGA-3 FISH OIL PO) Take 600 mg by mouth daily.  ? pantoprazole (PROTONIX) 40 MG tablet TAKE 1 TABLET BY MOUTH DAILY  ? potassium chloride (KLOR-CON M) 10 MEQ tablet TAKE 1 TABLET BY MOUTH THREE TIMES DAILY  ? rOPINIRole (REQUIP) 2 MG tablet TAKE 1 TABLET BY MOUTH TWICE DAILY (Patient taking differently: Take 2 mg by mouth 2 (two) times daily.)  ? Semaglutide,0.25 or 0.'5MG'$ /DOS, (OZEMPIC, 0.25 OR 0.5 MG/DOSE,) 2 MG/1.5ML SOPN Inject 0.5 mg into the skin once a week.  ? Vibegron (GEMTESA) 75 MG TABS Take 75 mg by mouth daily.  ? warfarin (COUMADIN) 5 MG tablet 1/2 tablet on Monday and Wednesday ?1 tablet all other days  ? [DISCONTINUED] amitriptyline (ELAVIL) 25 MG tablet Take 25 mg by mouth at bedtime.  ? [DISCONTINUED] pregabalin (LYRICA) 100 MG capsule Take 100 mg by mouth 4 (four) times daily.  ? ?No facility-administered medications prior to visit.  ? ? ?Review of Systems  ?Constitutional: Negative.   ?Respiratory: Negative.    ?Cardiovascular: Negative.   ?Musculoskeletal:  Positive for arthralgias, back pain and gait problem.  ?Neurological:  Positive for weakness and numbness.  ? ? ?  Objective  ?  ?BP 140/82 (BP Location: Right Arm, Patient Position: Sitting, Cuff Size: Normal)   Pulse 64   Temp 97.9 ?F (36.6 ?C) (Oral)   Ht '5\' 10"'$  (1.778 m)   Wt 259 lb 8 oz (117.7 kg)   LMP  (LMP Unknown)   BMI 37.23 kg/m?  ? ? ?Physical Exam ?Vitals reviewed.  ?Constitutional:   ?   General: She is not in acute distress. ?   Appearance: Normal appearance. She is well-developed. She is not diaphoretic.  ?HENT:  ?   Head: Normocephalic and atraumatic.  ?Eyes:  ?   General: No scleral icterus. ?   Conjunctiva/sclera: Conjunctivae normal.  ?Neck:  ?   Thyroid: No thyromegaly.  ?Cardiovascular:  ?   Rate and  Rhythm: Normal rate and regular rhythm.  ?   Heart sounds: Normal heart sounds.  ?Pulmonary:  ?   Effort: Pulmonary effort is normal. No respiratory distress.  ?   Breath sounds: Normal breath sounds. No wheezing, rhonchi or rales.  ?Musculoskeletal:  ?   Cervical back: Neck supple.  ?   Right lower leg: No edema.  ?   Left lower leg: No edema.  ?   Comments: Back: Mild midline TTP around L2-L5, ROM grossly intact.  Mild TTP over R and left lower back/SI joint. Negative SLR bilaterally.  Strength and sensation to light touch intact in lower extremities. ?   ?Lymphadenopathy:  ?   Cervical: No cervical adenopathy.  ?  Skin: ?   General: Skin is warm and dry.  ?Neurological:  ?   Mental Status: She is alert and oriented to person, place, and time. Mental status is at baseline.  ?Psychiatric:     ?   Mood and Affect: Mood normal.     ?   Behavior: Behavior normal.  ?  ? ? ?No results found for any visits on 02/22/22. ? Assessment & Plan  ?  ? ?Problem List Items Addressed This Visit   ? ?  ? Endocrine  ? Diabetic peripheral neuropathy (HCC) - Primary (Chronic)  ? Relevant Medications  ? amitriptyline (ELAVIL) 25 MG tablet  ? pregabalin (LYRICA) 100 MG capsule  ?  ? Musculoskeletal and Integument  ? DDD (degenerative disc disease), lumbar (Chronic)  ?  ? Other  ? Chronic pain syndrome (Chronic)  ? Relevant Medications  ? amitriptyline (ELAVIL) 25 MG tablet  ? pregabalin (LYRICA) 100 MG capsule  ? Neurogenic pain (Chronic)  ? Relevant Medications  ? amitriptyline (ELAVIL) 25 MG tablet  ? pregabalin (LYRICA) 100 MG capsule  ?Patient with known neurogenic and chronic pain with 3 failed back surgeries previously with worsening pain in her back and legs x2 to 3 months ?- Come to find out, she has been off of Lyrica and amitriptyline for around the same time. ?- Discussed with patient that being off of these medications could certainly have worsened her pain, so we will resume the previous dose today ?- Did discuss her multiple  sedating medications and taking care to avoid falls ?- Continue to follow with pain management-no change to opioid regimen today ?- Patient declines x-ray today, but if does not improve, would consider x

## 2022-02-23 ENCOUNTER — Other Ambulatory Visit: Payer: Self-pay | Admitting: Family Medicine

## 2022-02-23 DIAGNOSIS — Z7901 Long term (current) use of anticoagulants: Secondary | ICD-10-CM

## 2022-02-23 DIAGNOSIS — F3132 Bipolar disorder, current episode depressed, moderate: Secondary | ICD-10-CM | POA: Diagnosis not present

## 2022-02-25 ENCOUNTER — Inpatient Hospital Stay (HOSPITAL_COMMUNITY): Payer: Medicaid Other | Admitting: Physician Assistant

## 2022-02-25 VITALS — BP 126/76 | HR 64 | Temp 97.2°F | Resp 18 | Ht 70.0 in | Wt 256.6 lb

## 2022-02-25 DIAGNOSIS — D509 Iron deficiency anemia, unspecified: Secondary | ICD-10-CM | POA: Diagnosis not present

## 2022-02-25 DIAGNOSIS — N183 Chronic kidney disease, stage 3 unspecified: Secondary | ICD-10-CM | POA: Diagnosis not present

## 2022-02-25 DIAGNOSIS — I82409 Acute embolism and thrombosis of unspecified deep veins of unspecified lower extremity: Secondary | ICD-10-CM | POA: Diagnosis not present

## 2022-02-25 DIAGNOSIS — I129 Hypertensive chronic kidney disease with stage 1 through stage 4 chronic kidney disease, or unspecified chronic kidney disease: Secondary | ICD-10-CM | POA: Diagnosis not present

## 2022-02-25 DIAGNOSIS — E1122 Type 2 diabetes mellitus with diabetic chronic kidney disease: Secondary | ICD-10-CM | POA: Diagnosis not present

## 2022-02-25 DIAGNOSIS — D631 Anemia in chronic kidney disease: Secondary | ICD-10-CM | POA: Diagnosis not present

## 2022-02-25 DIAGNOSIS — I82402 Acute embolism and thrombosis of unspecified deep veins of left lower extremity: Secondary | ICD-10-CM | POA: Diagnosis not present

## 2022-02-25 DIAGNOSIS — Z90710 Acquired absence of both cervix and uterus: Secondary | ICD-10-CM | POA: Diagnosis not present

## 2022-02-25 NOTE — Patient Instructions (Signed)
[place] at [facility] ?Discharge Instructions ? ?You were seen today by Tarri Abernethy PA-C for your iron deficiency anemia.  At this time, your blood and iron levels look good.  There is no need for any IV iron at this time. ? ?Continue follow-up with vascular specialists.  Continue warfarin as prescribed. ? ?LABS: Return in 6 months for repeat labs ? ?FOLLOW-UP APPOINTMENT: Office visit after labs ? ? ?Thank you for choosing [facility] at [facility] to provide your oncology and hematology care.  To afford each patient quality time with our provider, please arrive at least 15 minutes before your scheduled appointment time.  ? ?If you have a lab appointment with the [facility] please come in thru the Main Entrance and check in at the main information desk. ? ?You need to re-schedule your appointment should you arrive 10 or more minutes late.  We strive to give you quality time with our providers, and arriving late affects you and other patients whose appointments are after yours.  Also, if you no show three or more times for appointments you may be dismissed from the clinic at the providers discretion.     ?Again, thank you for choosing [facility].  Our hope is that these requests will decrease the amount of time that you wait before being seen by our physicians.       ?_____________________________________________________________ ? ?Should you have questions after your visit to [facility], please contact our office at [phone] and follow the prompts.  Our office hours are 8:00 a.m. and 4:30 p.m. Monday - Friday.  Please note that voicemails left after 4:00 p.m. may not be returned until the following business day.  We are closed weekends and major holidays.  You do have access to a nurse 24-7, just call the main number to the clinic [phone] and do not press any options, hold on the line and a nurse will answer the  phone.   ? ?For prescription refill requests, have your pharmacy contact our office and allow 72 hours.   ? ?Due to Covid, you will need to wear a mask upon entering the hospital. If you do not have a mask, a mask will be given to you at the Main Entrance upon arrival. For doctor visits, patients may have 1 support person age 42 or older with them. For treatment visits, patients can not have anyone with them due to social distancing guidelines and our immunocompromised population.  ? ? ? ?

## 2022-02-26 ENCOUNTER — Encounter (HOSPITAL_COMMUNITY): Payer: Self-pay | Admitting: Hematology

## 2022-02-27 ENCOUNTER — Ambulatory Visit: Payer: Medicaid Other

## 2022-02-27 DIAGNOSIS — Z86718 Personal history of other venous thrombosis and embolism: Secondary | ICD-10-CM | POA: Diagnosis not present

## 2022-02-27 DIAGNOSIS — F3132 Bipolar disorder, current episode depressed, moderate: Secondary | ICD-10-CM | POA: Diagnosis not present

## 2022-02-27 LAB — POCT INR
INR: 1.9 — AB (ref 2.0–3.0)
PT: 22.8

## 2022-02-27 NOTE — Patient Instructions (Signed)
Description   ?Continue '5mg'$  daily except 2.'5mg'$  Wednesday.  Recheck in 4 weeks.  ? ?  ?  ?

## 2022-02-28 ENCOUNTER — Ambulatory Visit (HOSPITAL_COMMUNITY): Payer: Self-pay | Admitting: Physician Assistant

## 2022-03-01 DIAGNOSIS — F3132 Bipolar disorder, current episode depressed, moderate: Secondary | ICD-10-CM | POA: Diagnosis not present

## 2022-03-01 NOTE — Progress Notes (Signed)
? ? ? ?Chief Complaint:  ? ?OBESITY ?Jane [name] is here to discuss her progress with her obesity treatment plan along with follow-up of her obesity related diagnoses. Jane [name] is on the Category 4 Plan and states she is following her eating plan approximately 80% of the time. Jane [name] states she is doing water aerobics and yard work for 60 minutes 2 times per week. ? ?Today's visit was #: 26 ?Starting weight: 283 lbs ?Starting date: 03/29/2020 ?Today's weight: 254 lbs ?Today's date: 02/18/2022 ?Total lbs lost to date: 59 ?Total lbs lost since last in-office visit: 11 ? ?Interim History: Jane [name] has been doing water aerobics 2 times per week, exercising, and yard work. She is planting some vegetables in her garden. Jane [name] is following her plan about 80% of the time. She had a salad and chicken sandwich while out recently. She is planning more gardening in the next few weeks. ? ?Subjective:  ? ?1. Type 2 diabetes mellitus with hyperglycemia, without long-term current use of insulin (Jane [name]) ?Jane [name] is doing very well on insulin. No GI side effects are noted. ? ?2. Hypertension associated with diabetes (Jane [name]) ?Jane [name] blood pressure [place] well controlled today. She is seeing her PCP in 4 days. ? ?Assessment/Plan:  ? ?1. Type 2 diabetes mellitus with hyperglycemia, without long-term current use of insulin (Jane [name]) ?Jane [name] agrees to continue taking Ozempic 0.'5mg'$  and will follow up at the agreed upon time.  ? ?- Semaglutide,0.25 or 0.'5MG'$ /DOS, (OZEMPIC, 0.25 OR 0.5 MG/DOSE,) 2 MG/1.5ML SOPN; Inject 0.5 mg into the skin once a week.  Dispense: 1.5 mL; Refill: 0 ? ?2. Hypertension associated with diabetes (Jane [name]) ?Jane [name] agrees to follow up with here PCP for further management of her blood pressure medications. ? ?3. Obesity with current BMI of 36.5 ?Jane [name] is currently in the action stage of change. As such, her goal is to continue with weight loss efforts. She has agreed to the Category 4 Plan.  ? ?Exercise goals: All adults should avoid inactivity.  Some physical activity is better than none, and adults who participate in any amount of physical activity gain some health benefits. ? ?Behavioral modification strategies: increasing lean protein intake, meal planning and cooking strategies, keeping healthy foods in the home, and planning for success. ? ?Jane [name] has agreed to follow-up with our clinic in 4 weeks. She was informed of the importance of frequent follow-up visits to maximize her success with intensive lifestyle modifications for her multiple health conditions.  ? ?Objective:  ? ?Blood pressure 132/71, pulse 60, temperature 97.8 ?F (36.6 ?C), height '5\' 10"'$  (1.778 m), weight 254 lb (115.2 kg), SpO2 99 %. ?Body mass index is 36.45 kg/m?. ? ?General: Cooperative, alert, well developed, in no acute distress. ?HEENT: Conjunctivae and lids unremarkable. ?Cardiovascular: Regular rhythm.  ?Lungs: Normal work of breathing. ?Neurologic: No focal deficits.  ? ?Lab Results  ?Component Value Date  ? CREATININE 1.23 (H) 04/12/2021  ? BUN 33 (H) 04/12/2021  ? NA 140 04/12/2021  ? K 3.8 04/12/2021  ? CL 101 04/12/2021  ? CO2 32 04/12/2021  ? ?Lab Results  ?Component Value Date  ? ALT 20 04/12/2021  ? AST 17 04/12/2021  ? ALKPHOS 95 04/12/2021  ? BILITOT 0.6 04/12/2021  ? ?Lab Results  ?Component Value Date  ? HGBA1C 6.2 (H) 11/06/2021  ? HGBA1C 6.3 (H) 03/22/2021  ? HGBA1C 5.8 (A) 10/24/2020  ? HGBA1C 6.2 (H) 03/29/2020  ? HGBA1C 6.3 (H) 10/25/2019  ? ?Lab Results  ?Component Value Date  ? INSULIN 21.5 03/29/2020  ? ?  Lab Results  ?Component Value Date  ? TSH 0.107 (L) 05/18/2020  ? ?Lab Results  ?Component Value Date  ? CHOL 191 11/06/2021  ? HDL 38 (L) 11/06/2021  ? LDLCALC 116 (H) 11/06/2021  ? TRIG 187 (H) 11/06/2021  ? CHOLHDL 5.0 11/06/2021  ? ?Lab Results  ?Component Value Date  ? VD25OH 47.10 02/21/2022  ? VD25OH 50.60 01/01/2021  ? VD25OH 45.30 10/17/2020  ? ?Lab Results  ?Component Value Date  ? WBC 5.2 02/21/2022  ? HGB 13.2 02/21/2022  ? HCT 40.8 02/21/2022  ?  MCV 92.1 02/21/2022  ? PLT 152 02/21/2022  ? ?Lab Results  ?Component Value Date  ? IRON 87 02/21/2022  ? TIBC 300 02/21/2022  ? FERRITIN 133 02/21/2022  ? ? ?Attestation Statements:  ? ?Reviewed by clinician on day of visit: allergies, medications, problem list, medical history, surgical history, family history, social history, and previous encounter notes. ? ?I, Marcille Blanco, CMA, am acting as transcriptionist for Coralie Common, MD ? ?I have reviewed the above documentation for accuracy and completeness, and I agree with the above. Coralie Common, MD ? ?

## 2022-03-02 DIAGNOSIS — F3132 Bipolar disorder, current episode depressed, moderate: Secondary | ICD-10-CM | POA: Diagnosis not present

## 2022-03-04 ENCOUNTER — Telehealth: Payer: Self-pay

## 2022-03-04 DIAGNOSIS — M792 Neuralgia and neuritis, unspecified: Secondary | ICD-10-CM

## 2022-03-04 NOTE — Telephone Encounter (Signed)
Copied from [place] [id number]. Topic: General - Other ?>> Mar 04, 2022  8:29 AM Rayann Heman wrote: ?Reason for CRM: Pt called and stated that she would like an order for an xray. She stated Dr B will know what she is talking about. Please advise. ?

## 2022-03-05 ENCOUNTER — Encounter: Payer: Self-pay | Admitting: Vascular Surgery

## 2022-03-05 NOTE — Telephone Encounter (Signed)
I ordered DG Lumbar Spine Complete not sure is the correct one. If it is can you sign please. Thanks. ?

## 2022-03-05 NOTE — Addendum Note (Signed)
Addended by: Doristine Devoid on: 03/05/2022 11:02 AM ? ? Modules accepted: Orders ? ?

## 2022-03-05 NOTE — Telephone Encounter (Signed)
Ok to order Lumbar spine XRays - see diagnoses from last visit ?

## 2022-03-05 NOTE — Addendum Note (Signed)
Addended by: [name] on: 03/05/2022 12:16 PM ? ? Modules accepted: Orders ? ?

## 2022-03-07 ENCOUNTER — Encounter: Payer: Self-pay | Admitting: Family Medicine

## 2022-03-07 ENCOUNTER — Encounter: Payer: Self-pay | Admitting: Vascular Surgery

## 2022-03-08 ENCOUNTER — Telehealth: Payer: Self-pay | Admitting: *Deleted

## 2022-03-08 ENCOUNTER — Telehealth: Payer: Self-pay | Admitting: Family Medicine

## 2022-03-08 ENCOUNTER — Other Ambulatory Visit: Payer: Self-pay | Admitting: *Deleted

## 2022-03-08 DIAGNOSIS — M79672 Pain in left foot: Secondary | ICD-10-CM

## 2022-03-08 DIAGNOSIS — R1031 Right lower quadrant pain: Secondary | ICD-10-CM

## 2022-03-08 DIAGNOSIS — I871 Compression of vein: Secondary | ICD-10-CM

## 2022-03-08 DIAGNOSIS — F3132 Bipolar disorder, current episode depressed, moderate: Secondary | ICD-10-CM | POA: Diagnosis not present

## 2022-03-08 NOTE — Telephone Encounter (Signed)
Referral Request - Has patient seen PCP for this complaint? Yes.   ?*If NO, is insurance requiring patient see PCP for this issue before PCP can refer them? ?Referral for which specialty: left foot pain ?Preferred provider/office: any in [place] ?Reason for referral: pt states she was seeing a dr at [facility], but does not want to go back there. Ptefers to stay in [place] ? ?

## 2022-03-08 NOTE — Telephone Encounter (Signed)
Patient called and states she is having bilateral groin pain that radiates down both legs. She has been having increased pain over last 2 months. Spoke with Dr Carlis Abbott patient scheduled for IVC Iliac venous study and to see Dr Donzetta Matters on 03/13/22.  ?

## 2022-03-09 DIAGNOSIS — F3132 Bipolar disorder, current episode depressed, moderate: Secondary | ICD-10-CM | POA: Diagnosis not present

## 2022-03-11 ENCOUNTER — Ambulatory Visit
Admission: RE | Admit: 2022-03-11 | Discharge: 2022-03-11 | Disposition: A | Discharge status: Home / Self Care | Payer: Medicaid Other | Attending: Family Medicine | Admitting: Family Medicine

## 2022-03-11 ENCOUNTER — Ambulatory Visit
Admission: RE | Admit: 2022-03-11 | Discharge: 2022-03-11 | Disposition: A | Discharge status: Home / Self Care | Payer: Medicaid Other | Source: Ambulatory Visit | Attending: Family Medicine | Admitting: Family Medicine

## 2022-03-11 DIAGNOSIS — M792 Neuralgia and neuritis, unspecified: Secondary | ICD-10-CM | POA: Insufficient documentation

## 2022-03-11 DIAGNOSIS — M5136 Other intervertebral disc degeneration, lumbar region: Secondary | ICD-10-CM | POA: Diagnosis not present

## 2022-03-11 DIAGNOSIS — M545 Low back pain, unspecified: Secondary | ICD-10-CM | POA: Diagnosis not present

## 2022-03-12 ENCOUNTER — Encounter: Payer: Self-pay | Admitting: Family Medicine

## 2022-03-12 DIAGNOSIS — M5136 Other intervertebral disc degeneration, lumbar region: Secondary | ICD-10-CM

## 2022-03-12 NOTE — Telephone Encounter (Signed)
Ok to send referral to [facility] ?

## 2022-03-12 NOTE — Telephone Encounter (Signed)
Please place referral to Neurosurg in [place]. Thanks (use diagnoses from last visit re: back) ?

## 2022-03-13 ENCOUNTER — Other Ambulatory Visit: Payer: Self-pay

## 2022-03-13 ENCOUNTER — Ambulatory Visit: Payer: Self-pay

## 2022-03-13 ENCOUNTER — Ambulatory Visit (HOSPITAL_COMMUNITY)
Admission: RE | Admit: 2022-03-13 | Discharge: 2022-03-13 | Disposition: A | Discharge status: Home / Self Care | Payer: Medicaid Other | Source: Ambulatory Visit | Attending: Cardiovascular Disease | Admitting: Cardiovascular Disease

## 2022-03-13 ENCOUNTER — Ambulatory Visit: Payer: Medicaid Other | Admitting: Vascular Surgery

## 2022-03-13 ENCOUNTER — Encounter: Payer: Self-pay | Admitting: Vascular Surgery

## 2022-03-13 ENCOUNTER — Ambulatory Visit (HOSPITAL_COMMUNITY): Payer: Medicaid Other | Admitting: Physical Therapy

## 2022-03-13 VITALS — BP 116/65 | HR 66 | Temp 98.5°F | Resp 20 | Ht 70.0 in | Wt 256.0 lb

## 2022-03-13 DIAGNOSIS — R1031 Right lower quadrant pain: Secondary | ICD-10-CM

## 2022-03-13 DIAGNOSIS — R1032 Left lower quadrant pain: Secondary | ICD-10-CM | POA: Diagnosis not present

## 2022-03-13 DIAGNOSIS — I87002 Postthrombotic syndrome without complications of left lower extremity: Secondary | ICD-10-CM | POA: Diagnosis not present

## 2022-03-13 DIAGNOSIS — I871 Compression of vein: Secondary | ICD-10-CM | POA: Diagnosis not present

## 2022-03-13 DIAGNOSIS — F3132 Bipolar disorder, current episode depressed, moderate: Secondary | ICD-10-CM | POA: Diagnosis not present

## 2022-03-13 NOTE — H&P (View-Only) (Signed)
Patient ID: Jane [name], female   DOB: 1966/05/10, 56 y.o.   MRN: [id number]  Reason for Consult: Follow-up   Referred by [name], MD  Subjective:     HPI:  Jane [name] is a 56 y.o. 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  She remains on Coumadin as she has failed other blood thinners.  Recently she has lost approximately 35 pounds and has been working out and is really focused on her health.  Unfortunately in the past 2 months she has had bilateral groin pain.  She has no increase in swelling on either leg really has no swelling in the right lower extremity.  She occasionally wears compressive stockings but she has been compliant with water aerobics.  She has no skin changes bilateral lower extremities.  Past Medical History:  Diagnosis Date   ADD (attention deficit disorder)    Anxiety    Back pain    Bilateral swelling of feet    Bipolar 1 disorder (HCC)    Bipolar 1 disorder (HCC)    Bipolar disorder (HCC)    Chewing difficulty    Chronic fatigue syndrome    Chronic kidney disease    Stage 3 kidney disease;dx by Dr. Sinda Du.    Constipation    Depression    Diabetes mellitus without complication (HCC)    diet controlled   Dyspnea    with exertion   GERD (gastroesophageal reflux disease)    Headache    migraines   High cholesterol    History of blood clots    History of DVT (deep vein thrombosis)    left leg   Hypertension    states under control with meds., has been on med. x 2 years   Hypothyroidism    Joint pain    Neuropathy    Obsessive-compulsive disorder    Peripheral vascular disease ([place])    Prediabetes    Respiratory failure requiring intubation ([place])    Restless leg syndrome    Shortness of breath    Sleep apnea    Thrombocytopenia  ([place]) 09/15/2019   Trigger thumb of left hand 01/2018   Trigger thumb of right hand    Family History  Problem Relation Age of Onset   Heart disease Mother    Hyperlipidemia Mother    Hypertension Mother    Bipolar disorder Mother    Stroke Mother    Depression Mother    Sleep apnea Mother    Obesity Mother    Diabetes Father    Heart disease Father    Hyperlipidemia Father    Hypertension Father    Sleep apnea Father    Obesity Father    Drug abuse Daughter    ADD / ADHD Daughter    Drug abuse Daughter    Anxiety disorder Daughter    Bipolar disorder Daughter    Hypertension Sister    Hypertension Brother    Hyperlipidemia Brother    Heart disease Brother    Bipolar disorder Maternal Aunt    Suicidality Maternal Aunt    Past Surgical History:  Procedure Laterality Date   ABDOMINAL HYSTERECTOMY  06/2016   complete   APPLICATION OF WOUND VAC Left 04/27/2019   Procedure: APPLICATION OF WOUND VAC LEFT GROIN;  Surgeon: Waynetta Sandy, MD;  Location: [place];  Service: Vascular;  Laterality: Left;   AV FISTULA PLACEMENT Left 11/24/2018   Procedure: ARTERIOVENOUS (AV) FISTULA CREATION LEFT SFA TO LEFT FEMORAL VEIN;  Surgeon: Waynetta Sandy, MD;  Location: [place];  Service: Vascular;  Laterality: Left;   BACK SURGERY     CHOLECYSTECTOMY     COLONOSCOPY WITH PROPOFOL N/A 11/22/2019   Procedure: COLONOSCOPY WITH PROPOFOL;  Surgeon: [name], MD;  Location: [facility] ENDOSCOPY;  Service: Gastroenterology;  Laterality: N/A;   FEMORAL ARTERY EXPLORATION  03/30/2019   Procedure: Left Common Femoral Artery and Vein Exploration;  Surgeon: Waynetta Sandy, MD;  Location: [place];  Service: Vascular;;   FEMORAL-FEMORAL BYPASS GRAFT Left 11/24/2018   Procedure: BYPASS GRAFT FEMORAL-FEMORAL VENOUS LEFT TO RIGHT PALMA PROCEDURE USING CRYOVEIN;  Surgeon: Waynetta Sandy, MD;  Location: [place];  Service: Vascular;  Laterality: Left;   GROIN DEBRIDEMENT Left  04/27/2019   Procedure: GROIN DEBRIDEMENT;  Surgeon: Waynetta Sandy, MD;  Location: [place];  Service: Vascular;  Laterality: Left;   INSERTION OF ILIAC STENT  03/30/2019   Procedure: Stent of left common, external iliac veins and left common femoral vein;  Surgeon: Waynetta Sandy, MD;  Location: [place];  Service: Vascular;;   KNEE ARTHROSCOPY WITH MENISCAL REPAIR Left 11/14/2020   Procedure: LEFT KNEE ARTHROSCOPY WITH PARTIAL MEDIAL MENISCECTOMY;  Surgeon: Carole Civil, MD;  [facility]: [facility];  Service: Orthopedics;  Laterality: Left;   LOWER EXTREMITY VENOGRAPHY N/A 08/17/2018   Procedure: LOWER EXTREMITY VENOGRAPHY - Central Venogram;  Surgeon: Waynetta Sandy, MD;  Location: [place] [facility];  Service: Cardiovascular;  Laterality: N/A;   LOWER EXTREMITY VENOGRAPHY Bilateral 03/09/2019   Procedure: LOWER EXTREMITY VENOGRAPHY;  Surgeon: Waynetta Sandy, MD;  Location: [place] [facility];  Service: Cardiovascular;  Laterality: Bilateral;   LOWER EXTREMITY VENOGRAPHY Left 08/16/2019   Procedure: LOWER EXTREMITY VENOGRAPHY;  Surgeon: Waynetta Sandy, MD;  Location: [place] [facility];  Service: Cardiovascular;  Laterality: Left;   LUMBAR FUSION  11/21/2000   L5-S1   LUMBAR SPINE SURGERY     x 2 others   PATCH ANGIOPLASTY Left 03/30/2019   Procedure: Patch Angioplasty of the Left Common Femoral Vein using Venosure Biologic patch;  Surgeon: Waynetta Sandy, MD;  Location: [place];  Service: Vascular;  Laterality: Left;   PERIPHERAL VASCULAR INTERVENTION Left 08/16/2019   Procedure: PERIPHERAL VASCULAR INTERVENTION;  Surgeon: Waynetta Sandy, MD;  Location: [place] [facility];  Service: Cardiovascular;  Laterality: Left;  common femoral/femoral vein stent   TRIGGER FINGER RELEASE Right 12/01/2017   Procedure: RELEASE TRIGGER FINGER/A-1 PULLEY RIGHT THUMB;  Surgeon: Leanora Cover, MD;  Location: [place];  Service:  Orthopedics;  Laterality: Right;   TRIGGER FINGER RELEASE Left 01/26/2018   Procedure: LEFT TRIGGER THUMB RELEASE;  Surgeon: Leanora Cover, MD;  Location: [place];  Service: Orthopedics;  Laterality: Left;   ULTRASOUND GUIDANCE FOR VASCULAR ACCESS Right 03/30/2019   Procedure: Ultrasound-guided cannulation right internal jugular vein;  Surgeon: Waynetta Sandy, MD;  Location: [place];  Service: Vascular;  Laterality: Right;    Short Social History:  Social History   Tobacco Use   Smoking status: Never   Smokeless tobacco: Never  Substance Use Topics   Alcohol use: No    Allergies  Allergen Reactions   Aripiprazole Other (See Comments)    BECOMES  VIOLENT    Seroquel [Quetiapine Fumarate] Other (See Comments)    BECOMES VIOLENT  Chlorpromazine Other (See Comments)    SEVERE ANXIETY    Gabapentin Other (See Comments)    NIGHTMARES     Current Outpatient Medications  Medication Sig Dispense Refill   ALPRAZolam (XANAX) 1 MG tablet Take 1 tablet (1 mg total) by mouth 2 (two) times daily as needed. for anxiety 60 tablet 2   amitriptyline (ELAVIL) 25 MG tablet Take 1 tablet (25 mg total) by mouth at bedtime. 90 tablet 1   amLODipine (NORVASC) 5 MG tablet Take 1 tablet (5 mg total) by mouth daily. 90 tablet 0   amphetamine-dextroamphetamine (ADDERALL) 30 MG tablet Take 1 tablet by mouth 2 (two) times daily. 60 tablet 0   amphetamine-dextroamphetamine (ADDERALL) 30 MG tablet Take 1 tablet by mouth 2 (two) times daily. 60 tablet 0   amphetamine-dextroamphetamine (ADDERALL) 30 MG tablet Take 1 tablet by mouth 2 (two) times daily. 60 tablet 0   aspirin EC 81 MG tablet Take 81 mg by mouth daily.     atorvastatin (LIPITOR) 40 MG tablet Take 1 tablet (40 mg total) by mouth daily. 90 tablet 1   capsaicin (ZOSTRIX) 0.025 % cream Apply topically 2 (two) times daily. 60 g 0   cefpodoxime (VANTIN) 200 MG tablet Take 200 mg by mouth 2 (two) times daily.     cetirizine  (ZYRTEC) 10 MG tablet TAKE 1 TABLET BY MOUTH DAILY 90 tablet 1   conjugated estrogens (PREMARIN) vaginal cream Estrogen Cream Instruction  Discard applicator  Apply pea sized amount to tip of finger to urethra before bed. Wash hands well after application. Use Monday, Wednesday and Friday 42.5 g 12   cycloSPORINE (RESTASIS) 0.05 % ophthalmic emulsion Place 1 drop into both eyes 2 (two) times daily.     FLUoxetine (PROZAC) 20 MG capsule Take 1 capsule (20 mg total) by mouth daily. 30 capsule 2   FLUoxetine (PROZAC) 40 MG capsule TAKE 1 TABLET BY MOUTH DAILY 90 capsule 2   GARLIC PO Take 4,098 mg by mouth daily.     hydrochlorothiazide (HYDRODIURIL) 25 MG tablet TAKE 1 TABLET BY MOUTH DAILY Please schedule office visit BEFORE any future refills 90 tablet 1   HYDROcodone-acetaminophen (NORCO) 10-325 MG tablet Take 1 tablet by mouth every 6 (six) hours as needed. To last 30 days from fill date 120 tablet 0   levothyroxine (SYNTHROID) 125 MCG tablet Take 125 mcg by mouth daily before breakfast.     LINZESS 145 MCG CAPS capsule TAKE ONE CAPSULE BY MOUTH DAILY (Patient taking differently: Take 145 mcg by mouth daily as needed.) 90 capsule 3   lisinopril (ZESTRIL) 5 MG tablet Take 1 tablet (5 mg total) by mouth daily. 90 tablet 1   methocarbamol (ROBAXIN) 750 MG tablet Take 1 tablet (750 mg total) by mouth 2 (two) times daily as needed for muscle spasms. 60 tablet 2   Multiple Vitamin (MULTIVITAMIN) capsule Take 1 capsule by mouth daily.     Olopatadine HCl 0.2 % SOLN Place 1 drop into both eyes daily.     Omega-3 Fatty Acids (OMEGA-3 FISH OIL PO) Take 600 mg by mouth daily.     pantoprazole (PROTONIX) 40 MG tablet TAKE 1 TABLET BY MOUTH DAILY 90 tablet 3   potassium chloride (KLOR-CON M) 10 MEQ tablet TAKE 1 TABLET BY MOUTH THREE TIMES DAILY 90 tablet 1   pregabalin (LYRICA) 100 MG capsule Take 1 capsule (100 mg total) by mouth 4 (four) times daily. 120 capsule 1   rOPINIRole (REQUIP) 2 MG tablet  TAKE 1 TABLET BY MOUTH TWICE DAILY (Patient taking differently: Take 2 mg by mouth 2 (two) times daily.) 180 tablet 3   Semaglutide,0.25 or 0.'5MG'$ /DOS, (OZEMPIC, 0.25 OR 0.5 MG/DOSE,) 2 MG/1.5ML SOPN Inject 0.5 mg into the skin once a week. 1.5 mL 0   Vibegron (GEMTESA) 75 MG TABS Take 75 mg by mouth daily. 30 tablet 11   warfarin (COUMADIN) 5 MG tablet TAKE 1 AND 1/2 TABLETS BY MOUTH ON TUEDAYS AND THURSDAYS, THEN TAKE 1 TABLET DAILY ON ALL OTHER DAYS 60 tablet 3   No current facility-administered medications for this visit.    Review of Systems  Constitutional:  Constitutional negative. Eyes: Eyes negative.  Cardiovascular: Positive for leg swelling.  GI: Gastrointestinal negative.  Musculoskeletal:       Bilateral groin pain Skin: Skin negative.  Neurological: Neurological negative. Hematologic: Hematologic/lymphatic negative.  Psychiatric: Psychiatric negative.       Objective:  Objective   Vitals:   03/13/22 0947  BP: 116/65  Pulse: 66  Resp: 20  Temp: 98.5 F (36.9 C)  SpO2: 98%  Weight: 256 lb (116.1 kg)  Height: '5\' 10"'$  (1.778 m)   Body mass index is 36.73 kg/m.  Physical Exam HENT:     Head: Normocephalic.     Mouth/Throat:     Mouth: Mucous membranes are moist.  Eyes:     Pupils: Pupils are equal, round, and reactive to light.  Cardiovascular:     Pulses: Normal pulses.  Pulmonary:     Effort: Pulmonary effort is normal.  Abdominal:     General: Abdomen is flat.  Musculoskeletal:     Cervical back: Normal range of motion and neck supple.     Right lower leg: No edema.     Left lower leg: Edema present.     Comments: Right leg measures 39 cm left leg 43 cm measured 10 cm from tibial tuberosity  Skin:    General: Skin is warm and dry.     Capillary Refill: Capillary refill takes less than 2 seconds.  Neurological:     Mental Status: She is alert.  Psychiatric:        Mood and Affect: Mood normal.        Behavior: Behavior normal.        Thought  Content: Thought content normal.    Data: IVC/Iliac Findings:  +----------+------+--------+--------+     IVC    PatentThrombusComments  +----------+------+--------+--------+  IVC Distalpatent                  +----------+------+--------+--------+      +----------------+---------+-----------+---------+-----------+-------------  ---+        CIV       RT-PatentRT-ThrombusLT-PatentLT-Thrombus    Comments       +----------------+---------+-----------+---------+-----------+-------------  ---+  Common Iliac     patent                                  occluded  iliac   Prox                                                       vein  stent     +----------------+---------+-----------+---------+-----------+-------------  ---+  Common Iliac Mid patent  occluded  iliac                                                              vein  stent     +----------------+---------+-----------+---------+-----------+-------------  ---+  Common Iliac     patent                                  occluded  iliac   Distal                                                     vein  stent     +----------------+---------+-----------+---------+-----------+-------------  ---+       +-----------------+---------+-----------+---------+-----------+------------  ---+         EIV       RT-PatentRT-ThrombusLT-PatentLT-Thrombus   Comments       +-----------------+---------+-----------+---------+-----------+------------  ---+  External Iliac    patent                                 occluded  iliac   Vein Prox                                                  vein  stent     +-----------------+---------+-----------+---------+-----------+------------  ---+  External Iliac    patent                                 occluded  iliac   Vein Mid                                                    vein  stent     +-----------------+---------+-----------+---------+-----------+------------  ---+  External Iliac    patent                                 occluded  iliac   Vein Distal                                                vein  stent     +-----------------+---------+-----------+---------+-----------+------------  ---+      Summary:  Stenosis:  The left common iliac, external iliac and common femoral veins appear to  be occluded, s/p stent.   IVC appears to be patent, however it is difficult to fully evaluate.      Assessment/Plan:    56 year old female with history as above.  Unfortunately her stents in the left have occluded.  I am suspicious that her symptoms in the right lower extremity are not related but we will begin with left lower extremity venogram to evaluate the left-sided stents and possibly reopen them we can evaluate the IVC although she does not have swelling of the right lower extremity to suggest that this is the underlying issue.  Possibly we cannot recanalize the stents and she will be okay now that she has lost weight given that her leg really has no increased edema from previous evaluations and is really the same size today as it was at last visit in January.  She will continue Coumadin I will hold a few days prior to procedure and restart immediately after.     Waynetta Sandy MD [facility]

## 2022-03-13 NOTE — Telephone Encounter (Signed)
Patient advised.

## 2022-03-13 NOTE — Progress Notes (Signed)
? ?Patient ID: Jane [name], female   DOB: 06-11-1966, 56 y.o.   MRN: [id number] ? ?Reason for Consult: Follow-up ?  ?Referred by [name], MD ? ?Subjective:  ?   ?HPI: ? ?Jane [name] is a 56 y.o. 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  She remains on Coumadin as she has failed other blood thinners.  Recently she has lost approximately 35 pounds and has been working out and is really focused on her health.  Unfortunately in the past 2 months she has had bilateral groin pain.  She has no increase in swelling on either leg really has no swelling in the right lower extremity.  She occasionally wears compressive stockings but she has been compliant with water aerobics.  She has no skin changes bilateral lower extremities. ? ?Past Medical History:  ?Diagnosis Date  ? ADD (attention deficit disorder)   ? Anxiety   ? Back pain   ? Bilateral swelling of feet   ? Bipolar 1 disorder ([place])   ? Bipolar 1 disorder ([place])   ? Bipolar disorder ([place])   ? Chewing difficulty   ? Chronic fatigue syndrome   ? Chronic kidney disease   ? Stage 3 kidney disease;dx by Dr. Sinda Du.   ? Constipation   ? Depression   ? Diabetes mellitus without complication ([place])   ? diet controlled  ? Dyspnea   ? with exertion  ? GERD (gastroesophageal reflux disease)   ? Headache   ? migraines  ? High cholesterol   ? History of blood clots   ? History of DVT (deep vein thrombosis)   ? left leg  ? Hypertension   ? states under control with meds., has been on med. x 2 years  ? Hypothyroidism   ? Joint pain   ? Neuropathy   ? Obsessive-compulsive disorder   ? Peripheral vascular disease ([place])   ? Prediabetes   ? Respiratory failure requiring intubation ([place])   ? Restless leg syndrome   ? Shortness of breath   ? Sleep apnea   ? Thrombocytopenia  ([place]) 09/15/2019  ? Trigger thumb of left hand 01/2018  ? Trigger thumb of right hand   ? ?Family History  ?Problem Relation Age of Onset  ? Heart disease Mother   ? Hyperlipidemia Mother   ? Hypertension Mother   ? Bipolar disorder Mother   ? Stroke Mother   ? Depression Mother   ? Sleep apnea Mother   ? Obesity Mother   ? Diabetes Father   ? Heart disease Father   ? Hyperlipidemia Father   ? Hypertension Father   ? Sleep apnea Father   ? Obesity Father   ? Drug abuse Daughter   ? ADD / ADHD Daughter   ? Drug abuse Daughter   ? Anxiety disorder Daughter   ? Bipolar disorder Daughter   ? Hypertension Sister   ? Hypertension Brother   ? Hyperlipidemia Brother   ? Heart disease Brother   ? Bipolar disorder Maternal Aunt   ? Suicidality Maternal Aunt   ? ?Past Surgical History:  ?Procedure Laterality Date  ? ABDOMINAL HYSTERECTOMY  06/2016  ? complete  ? APPLICATION OF WOUND VAC Left 04/27/2019  ? Procedure: APPLICATION OF WOUND VAC LEFT GROIN;  Surgeon: Waynetta Sandy, MD;  Location: [place];  Service: Vascular;  Laterality: Left;  ? AV FISTULA PLACEMENT Left 11/24/2018  ? Procedure: ARTERIOVENOUS (AV) FISTULA CREATION LEFT SFA TO LEFT FEMORAL VEIN;  Surgeon: Waynetta Sandy, MD;  Location: [place];  Service: Vascular;  Laterality: Left;  ? BACK SURGERY    ? CHOLECYSTECTOMY    ? COLONOSCOPY WITH PROPOFOL N/A 11/22/2019  ? Procedure: COLONOSCOPY WITH PROPOFOL;  Surgeon: [name], MD;  Location: [facility] ENDOSCOPY;  Service: Gastroenterology;  Laterality: N/A;  ? FEMORAL ARTERY EXPLORATION  03/30/2019  ? Procedure: Left Common Femoral Artery and Vein Exploration;  Surgeon: Waynetta Sandy, MD;  Location: [place];  Service: Vascular;;  ? FEMORAL-FEMORAL BYPASS GRAFT Left 11/24/2018  ? Procedure: BYPASS GRAFT FEMORAL-FEMORAL VENOUS LEFT TO RIGHT PALMA PROCEDURE USING CRYOVEIN;  Surgeon: Waynetta Sandy, MD;  Location: [place];  Service: Vascular;  Laterality: Left;  ? GROIN DEBRIDEMENT Left  04/27/2019  ? Procedure: GROIN DEBRIDEMENT;  Surgeon: Waynetta Sandy, MD;  Location: [place];  Service: Vascular;  Laterality: Left;  ? INSERTION OF ILIAC STENT  03/30/2019  ? Procedure: Stent of left common, external iliac veins and left common femoral vein;  Surgeon: Waynetta Sandy, MD;  Location: [place];  Service: Vascular;;  ? KNEE ARTHROSCOPY WITH MENISCAL REPAIR Left 11/14/2020  ? Procedure: LEFT KNEE ARTHROSCOPY WITH PARTIAL MEDIAL MENISCECTOMY;  Surgeon: Carole Civil, MD;  [facility];  Service: Orthopedics;  Laterality: Left;  ? LOWER EXTREMITY VENOGRAPHY N/A 08/17/2018  ? Procedure: LOWER EXTREMITY VENOGRAPHY - Central Venogram;  Surgeon: Waynetta Sandy, MD;  Location: [place] [facility];  Service: Cardiovascular;  Laterality: N/A;  ? LOWER EXTREMITY VENOGRAPHY Bilateral 03/09/2019  ? Procedure: LOWER EXTREMITY VENOGRAPHY;  Surgeon: Waynetta Sandy, MD;  Location: [place] [facility];  Service: Cardiovascular;  Laterality: Bilateral;  ? LOWER EXTREMITY VENOGRAPHY Left 08/16/2019  ? Procedure: LOWER EXTREMITY VENOGRAPHY;  Surgeon: Waynetta Sandy, MD;  Location: [place] [facility];  Service: Cardiovascular;  Laterality: Left;  ? LUMBAR FUSION  11/21/2000  ? L5-S1  ? LUMBAR SPINE SURGERY    ? x 2 others  ? PATCH ANGIOPLASTY Left 03/30/2019  ? Procedure: Patch Angioplasty of the Left Common Femoral Vein using Venosure Biologic patch;  Surgeon: Waynetta Sandy, MD;  Location: [place];  Service: Vascular;  Laterality: Left;  ? PERIPHERAL VASCULAR INTERVENTION Left 08/16/2019  ? Procedure: PERIPHERAL VASCULAR INTERVENTION;  Surgeon: Waynetta Sandy, MD;  Location: [place] [facility];  Service: Cardiovascular;  Laterality: Left;  common femoral/femoral vein stent  ? TRIGGER FINGER RELEASE Right 12/01/2017  ? Procedure: RELEASE TRIGGER FINGER/A-1 PULLEY RIGHT THUMB;  Surgeon: Leanora Cover, MD;  Location: [facility];  Service:  Orthopedics;  Laterality: Right;  ? TRIGGER FINGER RELEASE Left 01/26/2018  ? Procedure: LEFT TRIGGER THUMB RELEASE;  Surgeon: Leanora Cover, MD;  Location: [place];  Service: Orthopedics;  Laterality: Left;  ? ULTRASOUND GUIDANCE FOR VASCULAR ACCESS Right 03/30/2019  ? Procedure: Ultrasound-guided cannulation right internal jugular vein;  Surgeon: Waynetta Sandy, MD;  Location: [place];  Service: Vascular;  Laterality: Right;  ? ? ?Short Social History:  ?Social History  ? ?Tobacco Use  ? Smoking status: Never  ? Smokeless tobacco: Never  ?Substance Use Topics  ? Alcohol use: No  ? ? ?Allergies  ?Allergen Reactions  ? Aripiprazole Other (See Comments)  ?  BECOMES  VIOLENT ?  ? Seroquel [Quetiapine Fumarate] Other (See Comments)  ?  BECOMES VIOLENT  ?  Chlorpromazine Other (See Comments)  ?  SEVERE ANXIETY ?  ? Gabapentin Other (See Comments)  ?  NIGHTMARES ?  ? ? ?Current Outpatient Medications  ?Medication Sig Dispense Refill  ? ALPRAZolam (XANAX) 1 MG tablet Take 1 tablet (1 mg total) by mouth 2 (two) times daily as needed. for anxiety 60 tablet 2  ? amitriptyline (ELAVIL) 25 MG tablet Take 1 tablet (25 mg total) by mouth at bedtime. 90 tablet 1  ? amLODipine (NORVASC) 5 MG tablet Take 1 tablet (5 mg total) by mouth daily. 90 tablet 0  ? amphetamine-dextroamphetamine (ADDERALL) 30 MG tablet Take 1 tablet by mouth 2 (two) times daily. 60 tablet 0  ? amphetamine-dextroamphetamine (ADDERALL) 30 MG tablet Take 1 tablet by mouth 2 (two) times daily. 60 tablet 0  ? amphetamine-dextroamphetamine (ADDERALL) 30 MG tablet Take 1 tablet by mouth 2 (two) times daily. 60 tablet 0  ? aspirin EC 81 MG tablet Take 81 mg by mouth daily.    ? atorvastatin (LIPITOR) 40 MG tablet Take 1 tablet (40 mg total) by mouth daily. 90 tablet 1  ? capsaicin (ZOSTRIX) 0.025 % cream Apply topically 2 (two) times daily. 60 g 0  ? cefpodoxime (VANTIN) 200 MG tablet Take 200 mg by mouth 2 (two) times daily.    ? cetirizine  (ZYRTEC) 10 MG tablet TAKE 1 TABLET BY MOUTH DAILY 90 tablet 1  ? conjugated estrogens (PREMARIN) vaginal cream Estrogen Cream Instruction ? ?Discard applicator ? ?Apply pea sized amount to tip of finger to

## 2022-03-13 NOTE — Telephone Encounter (Signed)
Answer Assessment - Initial Assessment Questions ?1. REASON FOR CALL or QUESTION: "What is your reason for calling today?" or "How can I best help you?" or "What question do you have that I can help answer?" ?    Pt wants to know when to reschedule her INR appt. Pt stated that it takes 1-2 weeks after restarting coumadin to achieve the the accurate INR.  ?Advised pt that NT will send note to office and they will contact her. ? ? ?Stop coumadin:03/21/22 ?Procedure date: 03/25/22 ? ?Protocols used: Information Only Call - No Triage-A-AH ? ?

## 2022-03-15 DIAGNOSIS — F3132 Bipolar disorder, current episode depressed, moderate: Secondary | ICD-10-CM | POA: Diagnosis not present

## 2022-03-16 DIAGNOSIS — F3132 Bipolar disorder, current episode depressed, moderate: Secondary | ICD-10-CM | POA: Diagnosis not present

## 2022-03-18 ENCOUNTER — Other Ambulatory Visit (HOSPITAL_COMMUNITY): Payer: Self-pay

## 2022-03-18 ENCOUNTER — Encounter (INDEPENDENT_AMBULATORY_CARE_PROVIDER_SITE_OTHER): Payer: Self-pay | Admitting: Family Medicine

## 2022-03-18 ENCOUNTER — Ambulatory Visit (INDEPENDENT_AMBULATORY_CARE_PROVIDER_SITE_OTHER): Payer: Medicaid Other | Admitting: Family Medicine

## 2022-03-18 VITALS — BP 101/65 | HR 62 | Temp 97.9°F | Ht 70.0 in | Wt 252.0 lb

## 2022-03-18 DIAGNOSIS — E1165 Type 2 diabetes mellitus with hyperglycemia: Secondary | ICD-10-CM

## 2022-03-18 DIAGNOSIS — E669 Obesity, unspecified: Secondary | ICD-10-CM | POA: Diagnosis not present

## 2022-03-18 DIAGNOSIS — Z6836 Body mass index (BMI) 36.0-36.9, adult: Secondary | ICD-10-CM

## 2022-03-18 DIAGNOSIS — E038 Other specified hypothyroidism: Secondary | ICD-10-CM | POA: Diagnosis not present

## 2022-03-18 DIAGNOSIS — Z7985 Long-term (current) use of injectable non-insulin antidiabetic drugs: Secondary | ICD-10-CM

## 2022-03-18 MED ORDER — SEMAGLUTIDE (1 MG/DOSE) 4 MG/3ML ~~LOC~~ SOPN
1.0000 mg | PEN_INJECTOR | SUBCUTANEOUS | 0 refills | Status: DC
  Filled 2022-03-18: qty 3, 28d supply, fill #0

## 2022-03-19 NOTE — Progress Notes (Signed)
Chief Complaint:   OBESITY Jane [name] is here to discuss her progress with her obesity treatment plan along with follow-up of her obesity related diagnoses. Jane [name] is on the Category 4 Plan and states she is following her eating plan approximately 75% of the time. Jane [name] states she is counting 7,000 steps 7 times per week.  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 She is getting lower extremity renography next week. Knee has been painful. She feels disappointed with weight loss. She thinks following plan is doable next few weeks. She exercise getting more and more difficult.   Subjective:   1. Type 2 diabetes mellitus with hyperglycemia, without long-term current use of insulin (HCC) Jane [name]'s last [name] WAS 6.2. she notes some increased hunger with 0.5 mg dose. She denies gastrointestinal issues and side effects.   2. Other specified hypothyroidism Jane [name] is currently on levothyroxine 125 mcg daily. Her last TSH was 2.618. She has lost 9 lbs since that appointment.   Assessment/Plan:   1. Type 2 diabetes mellitus with hyperglycemia, without long-term current use of insulin (HCC) Jane [name] agrees to increase Ozempic to 1 mg subcutaneous for 1 month with no refills. Good blood sugar control is important to decrease the likelihood of diabetic complications such as nephropathy, neuropathy, limb loss, blindness, coronary artery disease, and death. Intensive lifestyle modification including diet, exercise and weight loss are the first line of treatment for diabetes.   - Semaglutide, 1 MG/DOSE, 4 MG/3ML SOPN; Inject 1 mg as directed once a week.  Dispense: 3 mL; Refill: 0  2. Other specified hypothyroidism We will check TSH at next  appointment. Orders and follow up as documented in patient record.  Counseling Good thyroid control is important for overall health. Supratherapeutic thyroid levels are dangerous and will not improve weight loss results. Counseling: The correct way to take levothyroxine is fasting, with water, separated by at least 30 minutes from breakfast, and separated by more than 4 hours from calcium, iron, multivitamins, acid reflux medications (PPIs).    3. Obesity with current BMI of 36.2 Jane [name] is currently in the action stage of change. As such, her goal is to continue with weight loss efforts. She has agreed to the Category 4 Plan.   Exercise goals: All adults should avoid inactivity. Some physical activity is better than none, and adults who participate in any amount of physical activity gain some health benefits.  Behavioral modification strategies: increasing lean protein intake, meal planning and cooking strategies, keeping healthy foods in the home, and planning for success.  Jane [name] has agreed to follow-up with our clinic in 4 weeks. She was informed of the importance of frequent follow-up visits to maximize her success with intensive lifestyle modifications for her multiple health conditions.   Objective:   Blood pressure 101/65, pulse 62, temperature 97.9 F (36.6 C), height '5\' 10"'$  (1.778 m), weight 252 lb (114.3 kg), SpO2 98 %. Body mass index is 36.16 kg/m.  General: Cooperative, alert, well developed, in no acute distress. HEENT: Conjunctivae and lids unremarkable. Cardiovascular: Regular rhythm.  Lungs: Normal work of breathing. Neurologic: No focal deficits.   Lab Results  Component Value Date   CREATININE 1.23 (H) 04/12/2021   BUN 33 (H) 04/12/2021  NA 140 04/12/2021   K 3.8 04/12/2021   CL 101 04/12/2021   CO2 32 04/12/2021   Lab Results  Component Value Date   ALT 20 04/12/2021   AST 17 04/12/2021   ALKPHOS 95 04/12/2021   BILITOT 0.6 04/12/2021   Lab Results   Component Value Date   HGBA1C 6.2 (H) 11/06/2021   HGBA1C 6.3 (H) 03/22/2021   HGBA1C 5.8 (A) 10/24/2020   HGBA1C 6.2 (H) 03/29/2020   HGBA1C 6.3 (H) 10/25/2019   Lab Results  Component Value Date   INSULIN 21.5 03/29/2020   Lab Results  Component Value Date   TSH 0.107 (L) 05/18/2020   Lab Results  Component Value Date   CHOL 191 11/06/2021   HDL 38 (L) 11/06/2021   LDLCALC 116 (H) 11/06/2021   TRIG 187 (H) 11/06/2021   CHOLHDL 5.0 11/06/2021   Lab Results  Component Value Date   VD25OH 47.10 02/21/2022   VD25OH 50.60 01/01/2021   VD25OH 45.30 10/17/2020   Lab Results  Component Value Date   WBC 5.2 02/21/2022   HGB 13.2 02/21/2022   HCT 40.8 02/21/2022   MCV 92.1 02/21/2022   PLT 152 02/21/2022   Lab Results  Component Value Date   IRON 87 02/21/2022   TIBC 300 02/21/2022   FERRITIN 133 02/21/2022   Attestation Statements:   Reviewed by clinician on day of visit: allergies, medications, problem list, medical history, surgical history, family history, social history, and previous encounter notes.  I, Lizbeth Bark, RMA, am acting as transcriptionist for Coralie Common, MD.   I have reviewed the above documentation for accuracy and completeness, and I agree with the above. - Coralie Common, MD

## 2022-03-20 ENCOUNTER — Ambulatory Visit: Payer: Medicaid Other | Admitting: Podiatry

## 2022-03-20 DIAGNOSIS — M1A3720 Chronic gout due to renal impairment, left ankle and foot, without tophus (tophi): Secondary | ICD-10-CM | POA: Diagnosis not present

## 2022-03-20 DIAGNOSIS — G6289 Other specified polyneuropathies: Secondary | ICD-10-CM

## 2022-03-20 DIAGNOSIS — M67874 Other specified disorders of tendon, left ankle and foot: Secondary | ICD-10-CM

## 2022-03-20 NOTE — Patient Instructions (Signed)
Look for Voltaren gel at the pharmacy over the counter or online (also known as diclofenac 1% gel). Apply to the painful areas 3-4x daily with the supplied dosing card. Allow to dry for 10 minutes before going into socks/shoes ? ? ?Achilles Tendinitis  ?with Rehab ?Achilles tendinitis is a disorder of the Achilles tendon. The Achilles tendon connects the large calf muscles (Gastrocnemius and Soleus) to the heel bone (calcaneus). This tendon is sometimes called the heel cord. It is important for pushing-off and standing on your toes and is important for walking, running, or jumping. Tendinitis is often caused by overuse and repetitive microtrauma. ?SYMPTOMS ?Pain, tenderness, swelling, warmth, and redness may occur over the Achilles tendon even at rest. ?Pain with pushing off, or flexing or extending the ankle. ?Pain that is worsened after or during activity. ?CAUSES  ?Overuse sometimes seen with rapid increase in exercise programs or in sports requiring running and jumping. ?Poor physical conditioning (strength and flexibility or endurance). ?Running sports, especially training running down hills. ?Inadequate warm-up before practice or play or failure to stretch before participation. ?Injury to the tendon. ?PREVENTION  ?Warm up and stretch before practice or competition. ?Allow time for adequate rest and recovery between practices and competition. ?Keep up conditioning. ?Keep up ankle and leg flexibility. ?Improve or keep muscle strength and endurance. ?Improve cardiovascular fitness. ?Use proper technique. ?Use proper equipment (shoes, skates). ?To help prevent recurrence, taping, protective strapping, or an adhesive bandage may be recommended for several weeks after healing is complete. ?PROGNOSIS  ?Recovery may take weeks to several months to heal. ?Longer recovery is expected if symptoms have been prolonged. ?Recovery is usually quicker if the inflammation is due to a direct blow as compared with overuse or  sudden strain. ?RELATED COMPLICATIONS  ?Healing time will be prolonged if the condition is not correctly treated. The injury must be given plenty of time to heal. ?Symptoms can reoccur if activity is resumed too soon. ?Untreated, tendinitis may increase the risk of tendon rupture requiring additional time for recovery and possibly surgery. ?TREATMENT  ?The first treatment consists of rest anti-inflammatory medication, and ice to relieve the pain. ?Stretching and strengthening exercises after resolution of pain will likely help reduce the risk of recurrence. Referral to a physical therapist or athletic trainer for further evaluation and treatment may be helpful. ?A walking boot or cast may be recommended to rest the Achilles tendon. This can help break the cycle of inflammation and microtrauma. ?Arch supports (orthotics) may be prescribed or recommended by your caregiver as an adjunct to therapy and rest. ?Surgery to remove the inflamed tendon lining or degenerated tendon tissue is rarely necessary and has shown less than predictable results. ?MEDICATION  ?Nonsteroidal anti-inflammatory medications, such as aspirin and ibuprofen, may be used for pain and inflammation relief. Do not take within 7 days before surgery. Take these as directed by your caregiver. Contact your caregiver immediately if any bleeding, stomach upset, or signs of allergic reaction occur. Other minor pain relievers, such as acetaminophen, may also be used. ?Pain relievers may be prescribed as necessary by your caregiver. Do not take prescription pain medication for longer than 4 to 7 days. Use only as directed and only as much as you need. ?Cortisone injections are rarely indicated. Cortisone injections may weaken tendons and predispose to rupture. It is better to give the condition more time to heal than to use them. ?HEAT AND COLD ?Cold is used to relieve pain and reduce inflammation for acute and chronic Achilles  tendinitis. Cold should be  applied for 10 to 15 minutes every 2 to 3 hours for inflammation and pain and immediately after any activity that aggravates your symptoms. Use ice packs or an ice massage. ?Heat may be used before performing stretching and strengthening activities prescribed by your caregiver. Use a heat pack or a warm soak. ?SEEK MEDICAL CARE IF: ?Symptoms get worse or do not improve in 2 weeks despite treatment. ?New, unexplained symptoms develop. Drugs used in treatment may produce side effects. ? ?EXERCISES: ? ?RANGE OF MOTION (ROM) AND STRETCHING EXERCISES - Achilles Tendinitis  ?These exercises may help you when beginning to rehabilitate your injury. Your symptoms may resolve with or without further involvement from your physician, physical therapist or athletic trainer. While completing these exercises, remember:  ?Restoring tissue flexibility helps normal motion to return to the joints. This allows healthier, less painful movement and activity. ?An effective stretch should be held for at least 30 seconds. ?A stretch should never be painful. You should only feel a gentle lengthening or release in the stretched tissue. ? ?STRETCH  Gastroc, Standing  ?Place hands on wall. ?Extend right / left leg, keeping the front knee somewhat bent. ?Slightly point your toes inward on your back foot. ?Keeping your right / left heel on the floor and your knee straight, shift your weight toward the wall, not allowing your back to arch. ?You should feel a gentle stretch in the right / left calf. Hold this position for 10 seconds. ?Repeat 3 times. Complete this stretch 2 times per day. ? ?STRETCH  Soleus, Standing  ?Place hands on wall. ?Extend right / left leg, keeping the other knee somewhat bent. ?Slightly point your toes inward on your back foot. ?Keep your right / left heel on the floor, bend your back knee, and slightly shift your weight over the back leg so that you feel a gentle stretch deep in your back calf. ?Hold this position for 10  seconds. ?Repeat 3 times. Complete this stretch 2 times per day. ? ?Google, Standing  ?Note: This exercise can place a lot of stress on your foot and ankle. Please complete this exercise only if specifically instructed by your caregiver.  ?Place the ball of your right / left foot on a step, keeping your other foot firmly on the same step. ?Hold on to the wall or a rail for balance. ?Slowly lift your other foot, allowing your body weight to press your heel down over the edge of the step. ?You should feel a stretch in your right / left calf. ?Hold this position for 10 seconds. ?Repeat this exercise with a slight bend in your knee. ?Repeat 3 times. Complete this stretch 2 times per day.  ? ?STRENGTHENING EXERCISES - Achilles Tendinitis ?These exercises may help you when beginning to rehabilitate your injury. They may resolve your symptoms with or without further involvement from your physician, physical therapist or athletic trainer. While completing these exercises, remember:  ?Muscles can gain both the endurance and the strength needed for everyday activities through controlled exercises. ?Complete these exercises as instructed by your physician, physical therapist or athletic trainer. Progress the resistance and repetitions only as guided. ?You may experience muscle soreness or fatigue, but the pain or discomfort you are trying to eliminate should never worsen during these exercises. If this pain does worsen, stop and make certain you are following the directions exactly. If the pain is still present after adjustments, discontinue the exercise until you can discuss the  trouble with your clinician. ? ?STRENGTH - Plantar-flexors  ?Sit with your right / left leg extended. Holding onto both ends of a rubber exercise band/tubing, loop it around the ball of your foot. Keep a slight tension in the band. ?Slowly push your toes away from you, pointing them downward. ?Hold this position for 10 seconds. Return  slowly, controlling the tension in the band/tubing. ?Repeat 3 times. Complete this exercise 2 times per day.  ? ?STRENGTH - Plantar-flexors  ?Stand with your feet shoulder width apart. Steady yourself with a

## 2022-03-21 ENCOUNTER — Encounter: Payer: Self-pay | Admitting: Podiatry

## 2022-03-21 LAB — URIC ACID: Uric Acid: 6.5 mg/dL (ref 3.0–7.2)

## 2022-03-22 DIAGNOSIS — F3132 Bipolar disorder, current episode depressed, moderate: Secondary | ICD-10-CM | POA: Diagnosis not present

## 2022-03-23 DIAGNOSIS — F3132 Bipolar disorder, current episode depressed, moderate: Secondary | ICD-10-CM | POA: Diagnosis not present

## 2022-03-25 ENCOUNTER — Other Ambulatory Visit: Payer: Self-pay

## 2022-03-25 ENCOUNTER — Encounter (HOSPITAL_COMMUNITY)
Admission: RE | Disposition: A | Discharge status: Home / Self Care | Payer: Self-pay | Source: Home / Self Care | Attending: Vascular Surgery

## 2022-03-25 ENCOUNTER — Ambulatory Visit (HOSPITAL_COMMUNITY)
Admission: RE | Admit: 2022-03-25 | Discharge: 2022-03-25 | Disposition: A | Discharge status: Home / Self Care | Payer: Medicaid Other | Attending: Vascular Surgery | Admitting: Vascular Surgery

## 2022-03-25 ENCOUNTER — Encounter (HOSPITAL_COMMUNITY): Payer: Self-pay | Admitting: Vascular Surgery

## 2022-03-25 DIAGNOSIS — Z7901 Long term (current) use of anticoagulants: Secondary | ICD-10-CM | POA: Diagnosis not present

## 2022-03-25 DIAGNOSIS — Z7982 Long term (current) use of aspirin: Secondary | ICD-10-CM | POA: Insufficient documentation

## 2022-03-25 DIAGNOSIS — I87092 Postthrombotic syndrome with other complications of left lower extremity: Secondary | ICD-10-CM | POA: Diagnosis not present

## 2022-03-25 DIAGNOSIS — Z95828 Presence of other vascular implants and grafts: Secondary | ICD-10-CM | POA: Insufficient documentation

## 2022-03-25 DIAGNOSIS — I82512 Chronic embolism and thrombosis of left femoral vein: Secondary | ICD-10-CM | POA: Diagnosis not present

## 2022-03-25 DIAGNOSIS — I82522 Chronic embolism and thrombosis of left iliac vein: Secondary | ICD-10-CM | POA: Insufficient documentation

## 2022-03-25 HISTORY — PX: PERIPHERAL VASCULAR THROMBECTOMY: CATH118306

## 2022-03-25 HISTORY — PX: LOWER EXTREMITY VENOGRAPHY: CATH118253

## 2022-03-25 HISTORY — PX: PERIPHERAL VASCULAR INTERVENTION: CATH118257

## 2022-03-25 LAB — POCT I-STAT, CHEM 8
BUN: 31 mg/dL — ABNORMAL HIGH (ref 6–20)
Calcium, Ion: 1.31 mmol/L (ref 1.15–1.40)
Chloride: 100 mmol/L (ref 98–111)
Creatinine, Ser: 1.6 mg/dL — ABNORMAL HIGH (ref 0.44–1.00)
Glucose, Bld: 96 mg/dL (ref 70–99)
HCT: 39 % (ref 36.0–46.0)
Hemoglobin: 13.3 g/dL (ref 12.0–15.0)
Potassium: 4.1 mmol/L (ref 3.5–5.1)
Sodium: 141 mmol/L (ref 135–145)
TCO2: 35 mmol/L — ABNORMAL HIGH (ref 22–32)

## 2022-03-25 LAB — GLUCOSE, CAPILLARY: Glucose-Capillary: 86 mg/dL (ref 70–99)

## 2022-03-25 LAB — PROTIME-INR
INR: 1.2 (ref 0.8–1.2)
Prothrombin Time: 15.5 seconds — ABNORMAL HIGH (ref 11.4–15.2)

## 2022-03-25 SURGERY — LOWER EXTREMITY VENOGRAPHY
Anesthesia: LOCAL | Laterality: Left

## 2022-03-25 MED ORDER — MIDAZOLAM HCL 2 MG/2ML IJ SOLN
INTRAMUSCULAR | Status: AC
  Filled 2022-03-25: qty 2

## 2022-03-25 MED ORDER — KETOROLAC TROMETHAMINE 30 MG/ML IJ SOLN
INTRAMUSCULAR | Status: DC | PRN
  Administered 2022-03-25: 30 mg via INTRAVENOUS

## 2022-03-25 MED ORDER — FENTANYL CITRATE (PF) 100 MCG/2ML IJ SOLN
INTRAMUSCULAR | Status: DC | PRN
  Administered 2022-03-25 (×2): 25 ug via INTRAVENOUS

## 2022-03-25 MED ORDER — KETOROLAC TROMETHAMINE 30 MG/ML IJ SOLN
30.0000 mg | Freq: Once | INTRAMUSCULAR | Status: DC
  Filled 2022-03-25: qty 1

## 2022-03-25 MED ORDER — HEPARIN (PORCINE) IN NACL 1000-0.9 UT/500ML-% IV SOLN
INTRAVENOUS | Status: DC | PRN
  Administered 2022-03-25: 500 mL

## 2022-03-25 MED ORDER — IODIXANOL 320 MG/ML IV SOLN
INTRAVENOUS | Status: DC | PRN
  Administered 2022-03-25: 40 mL

## 2022-03-25 MED ORDER — HEPARIN (PORCINE) IN NACL 1000-0.9 UT/500ML-% IV SOLN
INTRAVENOUS | Status: AC
  Filled 2022-03-25: qty 500

## 2022-03-25 MED ORDER — ACETAMINOPHEN 325 MG PO TABS: 650.0000 mg | ORAL_TABLET | ORAL | Status: DC | PRN

## 2022-03-25 MED ORDER — MIDAZOLAM HCL 2 MG/2ML IJ SOLN
INTRAMUSCULAR | Status: DC | PRN
  Administered 2022-03-25 (×2): 1 mg via INTRAVENOUS

## 2022-03-25 MED ORDER — HYDROMORPHONE HCL 1 MG/ML IJ SOLN: 0.5000 mg | INTRAMUSCULAR | Status: DC | PRN

## 2022-03-25 MED ORDER — SODIUM CHLORIDE 0.9 % IV SOLN: INTRAVENOUS | Status: DC

## 2022-03-25 MED ORDER — OXYCODONE HCL 5 MG PO TABS
5.0000 mg | ORAL_TABLET | ORAL | Status: DC | PRN
  Administered 2022-03-25: 10 mg via ORAL
  Filled 2022-03-25: qty 2

## 2022-03-25 MED ORDER — SODIUM CHLORIDE 0.9 % IV SOLN: 250.0000 mL | INTRAVENOUS | Status: DC | PRN

## 2022-03-25 MED ORDER — HEPARIN SODIUM (PORCINE) 1000 UNIT/ML IJ SOLN
INTRAMUSCULAR | Status: DC | PRN
  Administered 2022-03-25: 4000 [IU] via INTRAVENOUS
  Administered 2022-03-25: 10000 [IU] via INTRAVENOUS

## 2022-03-25 MED ORDER — HEPARIN SODIUM (PORCINE) 1000 UNIT/ML IJ SOLN
INTRAMUSCULAR | Status: AC
  Filled 2022-03-25: qty 10

## 2022-03-25 MED ORDER — LABETALOL HCL 5 MG/ML IV SOLN: 10.0000 mg | INTRAVENOUS | Status: DC | PRN

## 2022-03-25 MED ORDER — FENTANYL CITRATE (PF) 100 MCG/2ML IJ SOLN
INTRAMUSCULAR | Status: AC
  Filled 2022-03-25: qty 2

## 2022-03-25 MED ORDER — SODIUM CHLORIDE 0.9% FLUSH: 3.0000 mL | Freq: Two times a day (BID) | INTRAVENOUS | Status: DC

## 2022-03-25 MED ORDER — LIDOCAINE HCL (PF) 1 % IJ SOLN
INTRAMUSCULAR | Status: AC
  Filled 2022-03-25: qty 30

## 2022-03-25 MED ORDER — COLCHICINE 0.6 MG PO TABS: ORAL_TABLET | ORAL | 2 refills | Status: DC

## 2022-03-25 MED ORDER — ONDANSETRON HCL 4 MG/2ML IJ SOLN
4.0000 mg | Freq: Four times a day (QID) | INTRAMUSCULAR | Status: DC | PRN
Start: 2022-03-25 — End: 2022-03-25

## 2022-03-25 MED ORDER — LIDOCAINE HCL (PF) 1 % IJ SOLN
INTRAMUSCULAR | Status: DC | PRN
  Administered 2022-03-25: 5 mL

## 2022-03-25 MED ORDER — HYDRALAZINE HCL 20 MG/ML IJ SOLN: 5.0000 mg | INTRAMUSCULAR | Status: DC | PRN

## 2022-03-25 MED ORDER — SODIUM CHLORIDE 0.9% FLUSH: 3.0000 mL | INTRAVENOUS | Status: DC | PRN

## 2022-03-25 SURGICAL SUPPLY — 21 items
BALLN ATLAS 14X40X75 (BALLOONS) ×2
BALLN MUSTANG 12.0X40 75 (BALLOONS) ×2
BALLOON ATLAS 14X40X75 (BALLOONS) IMPLANT
BALLOON MUSTANG 12.0X40 75 (BALLOONS) IMPLANT
CATH INFINITI VERT 5FR 125CM (CATHETERS) ×1 IMPLANT
CATH RETRIEVER CLOT 16MMX105CM (CATHETERS) ×1 IMPLANT
CATH VISIONS PV .035 IVUS (CATHETERS) ×1 IMPLANT
GLIDEWIRE ADV .035X260CM (WIRE) ×1 IMPLANT
GLIDEWIRE NITREX 0.018X80X5 (WIRE) ×2
GUIDEWIRE NITREX 0.018X80X5 (WIRE) IMPLANT
KIT ENCORE 26 ADVANTAGE (KITS) ×1 IMPLANT
KIT MICROPUNCTURE NIT STIFF (SHEATH) ×1 IMPLANT
PROTECTION STATION PRESSURIZED (MISCELLANEOUS) ×2
SHEATH CLOT RETRIEVER (SHEATH) ×1 IMPLANT
SHEATH PINNACLE 5F 10CM (SHEATH) ×1 IMPLANT
SHEATH PINNACLE 8F 10CM (SHEATH) ×1 IMPLANT
STATION PROTECTION PRESSURIZED (MISCELLANEOUS) IMPLANT
STENT VENOUS ABRE 14X80 (Permanent Stent) ×1 IMPLANT
STENT VENOUS ABRE 16X100 (Permanent Stent) ×1 IMPLANT
TRANSDUCER W/STOPCOCK (MISCELLANEOUS) ×2 IMPLANT
TRAY PV CATH (CUSTOM PROCEDURE TRAY) ×2 IMPLANT

## 2022-03-25 NOTE — Interval H&P Note (Signed)
History and Physical Interval Note:  03/25/2022 7:34 AM  Jane [name]  has presented today for surgery, with the diagnosis of post thrombotic syndrome.  The various methods of treatment have been discussed with the patient and family. After consideration of risks, benefits and other options for treatment, the patient has consented to  Procedure(s): LOWER EXTREMITY VENOGRAPHY (N/A) as a surgical intervention.  The patient's history has been reviewed, patient examined, no change in status, stable for surgery.  I have reviewed the patient's chart and labs.  Questions were answered to the patient's satisfaction.     Servando Snare

## 2022-03-25 NOTE — Op Note (Signed)
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 Donzetta Matters, MD Procedure Performed: 1.  Ultrasound-guided cannulation left small saphenous vein 2.  Left lower extremity and central venography 3.  Intravascular ultrasound left femoral, left common femoral, left external and common iliac veins and IVC 4.  Stent of left common femoral vein with 14 x 80 mm Abre 5.  Stent of left common and external iliac veins with 16 x 175m Abre 6.  Mechanical thrombectomy left common and external iliac veins and left common femoral vein stent and left femoral vein with Inari Clottriever 7.  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  She has a history that includes initially a left to right femorofemoral bypass for occluded left common and external iliac veins and subsequent cutdown left common femoral vein complicated with rupture that required Viabahn stenting as well as proximal extension and then later had distal extension of the stents with self-expanding noncovered stents.  She now has recurrent symptoms with occluded stents and is indicated for possible mechanical thrombectomy.  Findings: The femoral vein was patent.  The stents were all occluded but there was a channel higher up.  IVC was patent.  We performed mechanical thrombectomy and then I did have to place 2 new stents 1 in the common femoral vein and one in the common iliac to external leg veins on the left.  At completion all the stents were patent there was brisk flow through the stents without any residual stenosis where previously they were occluded.  She will restart Coumadin tonight.  She is on aspirin therapy.   Procedure:  The  patient was identified in the holding area and taken to room 8.  The patient was then placed prone on the table and prepped and draped in the usual sterile fashion.  A time out was called.  Ultrasound was used to evaluate the left small saphenous vein.  This had some chronic thickening but was compressible.  The area was anesthetized 1% lidocaine cannulated with micropuncture needle followed by wire sheath.  Images saved the permanent record.  I placed a Glidewire advantage followed by 5 FPakistansheath and then performed venography.  Stents were obviously occluded.  Patient was initially administered 10,000's of heparin and later initial 5000.  I was able to cannulate the stents with a Berenstein catheter across intraluminally with the catheter and wire and performed angiography centrally which demonstrated that we were within the IVC.  I then placed the wire into the left IJ and this was marked on the drapes.  We then exchanged for an 8 French sheath and then performed intravascular ultrasound from the IVC all the way down through the femoral vein with the above findings of occluded left common and external leg vein stents and left common femoral vein stent.  We then exchanged for the Inari sheath under fluoroscopic guidance.  We performed mechanical thrombectomy with 3 passes of the clot Treiver with good return of chronic appearing thrombus.  Completion intravascular ultrasound demonstrated an area of disrupted stent in the common femoral vein this was primarily stented with a 14 x 80 stent and then there was a residual chronic thrombus within the common and external leg vein stents and this was primarily  stented with a 16 x 100 stent.  Proximally we dilated with a 14 mm balloon and more peripherally with a 12 mm balloon.  Completion demonstrated no residual stenosis in the femoral vein was patent by venogram with minimal collaterals from the stenting although the profunda and saphenous veins are patent leading up  to the stents.  Satisfied with this we remove the wire and the sheath and pressure was held until hemostasis obtained.  She did tolerate procedure well without any complication.   Contrast: 40cc  Cameron Schwinn C. Donzetta Matters, MD [facility] Office: [phone] Pager: [phone]

## 2022-03-25 NOTE — Progress Notes (Signed)
  Subjective:  Patient ID: Jane [name], female    DOB: [birth year],  MRN: [id number]  Chief Complaint  Patient presents with   Foot Pain     (np) bil pain - left worse - pain in great toe and 5ht toe - prediabetic    56 y.o. female presents with the above complaint. History confirmed with patient.  She has numbness in the left foot especially in the toes can be painful and the nails in the tips of the toes.  Feet always hurt all over physical cramping in her arches.  Also has swelling and tenderness at the insertion of her Achilles.  She previously has had ingrown toenails does not think that they were properly resolved.  Never evaluated for gout.  She has an upcoming venogram with Dr. Donzetta Matters.  She does have a history of spinal pathology as well  Objective:  Physical Exam: warm, good capillary refill, no trophic changes or ulcerative lesions, normal DP and PT pulses, and abnormal sensory exam with loss of protective sensation in the toes and pain at the insertion of the Achilles.  Assessment:   1. 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 Achilles tendinosis of left ankle      Plan:  Patient was evaluated and treated and all questions answered.  Discussed with her she has symptoms that are multifactorial and expect some of this could be polyneuropathy she does have a history of chronic pain and back issues especially in the lumbar spine.  Her diabetes is well controlled.  I do not know if she would qualify for Qutenza but previously is not something that Medicaid would pay for she will discuss this with her pain doctor.  She has Voltaren as well at home and I recommend she use this.  I thought it was important to check her for gout and her uric acid was in the higher range of normal at 6.5.  I did prescribe her colchicine we will see if this helps to alleviate this.  Discussed etiology treatment options for Achilles tendinitis.  Recommended stretching and home  therapy and Voltaren gel for this.  May consider formal physical therapy if not improving.  Return in about 8 weeks (around 05/15/2022) for re-check Achilles tendon.

## 2022-03-26 ENCOUNTER — Encounter: Payer: Self-pay | Admitting: Student in an Organized Health Care Education/Training Program

## 2022-03-26 ENCOUNTER — Other Ambulatory Visit: Payer: Self-pay

## 2022-03-26 ENCOUNTER — Telehealth (HOSPITAL_COMMUNITY): Payer: Self-pay | Admitting: *Deleted

## 2022-03-26 ENCOUNTER — Ambulatory Visit
Payer: Medicaid Other | Attending: Student in an Organized Health Care Education/Training Program | Admitting: Student in an Organized Health Care Education/Training Program

## 2022-03-26 ENCOUNTER — Other Ambulatory Visit: Payer: Self-pay | Admitting: Family Medicine

## 2022-03-26 ENCOUNTER — Encounter: Payer: Self-pay | Admitting: Family Medicine

## 2022-03-26 VITALS — BP 116/58 | HR 61 | Temp 97.0°F | Resp 16 | Ht 70.0 in | Wt 250.0 lb

## 2022-03-26 DIAGNOSIS — M1712 Unilateral primary osteoarthritis, left knee: Secondary | ICD-10-CM

## 2022-03-26 DIAGNOSIS — G8929 Other chronic pain: Secondary | ICD-10-CM | POA: Diagnosis not present

## 2022-03-26 DIAGNOSIS — I871 Compression of vein: Secondary | ICD-10-CM | POA: Diagnosis not present

## 2022-03-26 DIAGNOSIS — Z0289 Encounter for other administrative examinations: Secondary | ICD-10-CM

## 2022-03-26 DIAGNOSIS — M961 Postlaminectomy syndrome, not elsewhere classified: Secondary | ICD-10-CM

## 2022-03-26 DIAGNOSIS — G894 Chronic pain syndrome: Secondary | ICD-10-CM

## 2022-03-26 DIAGNOSIS — M5030 Other cervical disc degeneration, unspecified cervical region: Secondary | ICD-10-CM

## 2022-03-26 DIAGNOSIS — M792 Neuralgia and neuritis, unspecified: Secondary | ICD-10-CM

## 2022-03-26 DIAGNOSIS — M5136 Other intervertebral disc degeneration, lumbar region: Secondary | ICD-10-CM | POA: Insufficient documentation

## 2022-03-26 DIAGNOSIS — E1142 Type 2 diabetes mellitus with diabetic polyneuropathy: Secondary | ICD-10-CM

## 2022-03-26 DIAGNOSIS — M51369 Other intervertebral disc degeneration, lumbar region without mention of lumbar back pain or lower extremity pain: Secondary | ICD-10-CM

## 2022-03-26 DIAGNOSIS — M7918 Myalgia, other site: Secondary | ICD-10-CM | POA: Diagnosis not present

## 2022-03-26 MED ORDER — METHOCARBAMOL 750 MG PO TABS: 750.0000 mg | ORAL_TABLET | Freq: Two times a day (BID) | ORAL | 5 refills | Status: DC | PRN

## 2022-03-26 MED ORDER — HYDROCODONE-ACETAMINOPHEN 10-325 MG PO TABS: 1.0000 | ORAL_TABLET | Freq: Four times a day (QID) | ORAL | 0 refills | Status: DC | PRN

## 2022-03-26 MED ORDER — METHOCARBAMOL 750 MG PO TABS: 750.0000 mg | ORAL_TABLET | Freq: Two times a day (BID) | ORAL | 2 refills | Status: DC | PRN

## 2022-03-26 MED ORDER — HYDROCODONE-ACETAMINOPHEN 10-325 MG PO TABS: 1.0000 | ORAL_TABLET | Freq: Four times a day (QID) | ORAL | 0 refills | Status: AC | PRN

## 2022-03-26 MED FILL — Heparin Sod (Porcine)-NaCl IV Soln 1000 Unit/500ML-0.9%: INTRAVENOUS | Qty: 500 | Status: AC

## 2022-03-26 NOTE — Telephone Encounter (Signed)
Yes that's fine 

## 2022-03-26 NOTE — Telephone Encounter (Signed)
Jane [name] with [facility] called stating that the '30mg'$  of Adderall is on backorder.  They wanted to know and get permission from provider to see if it is okay for them to fill the Adderall '15mg'$  BID so that patient do no go without her medication. [phone].

## 2022-03-26 NOTE — Progress Notes (Signed)
PROVIDER NOTE: Information contained herein reflects review and annotations entered in association with encounter. Interpretation of such information and data should be left to medically-trained personnel. Information provided to patient can be located elsewhere in the medical record under "Patient Instructions". Document created using STT-dictation technology, any transcriptional errors that may result from process are unintentional.    Patient: Jane [name]  Service Category: E/M  Provider: Gillis Santa, MD  DOB: 12-Apr-1966  DOS: 03/26/2022  Specialty: Interventional Pain Management  MRN: [id number]  Setting: Ambulatory outpatient  PCP: [name], MD  Type: Established Patient    Referring Provider: [name], MD  Location: Office  Delivery: Face-to-face     HPI  Jane [name], a 56 y.o. year old female, is here today because of her Chronic pain syndrome [G89.4]. Jane [name] primary complain today is Back Pain (low) and Leg Pain (Bilateral through to toes)  Last encounter: My last encounter with her was on 12/31/21  Pertinent problems: Jane [name] has History of DVT (deep vein thrombosis); Bipolar 1 disorder ([place]); Depression, major, recurrent ([place]); Radicular low back pain; May-Thurner syndrome; Iliac vein stenosis, left; Morbid obesity ([place]); Peripheral neuropathy; Chronic low back pain (Bilateral) w/ sciatica (Bilateral); Chronic anticoagulation (Coumadin); CKD stage 3 due to type 2 diabetes mellitus ([place]); Displacement of lumbar intervertebral disc without myelopathy; Chronic pain syndrome; Pharmacologic therapy; Abnormal MRI, cervical spine (01/08/2017); Abnormal MRI, lumbar spine (05/11/2014); DDD (degenerative disc disease), cervical; DDD (degenerative disc disease), lumbar; Failed back surgical syndrome; Diabetic peripheral neuropathy ([place]); Neurogenic pain; and Chronic musculoskeletal pain on their pertinent problem list. Pain Assessment: Severity of Chronic  pain is reported as a 8 /10. Location: Back Lower/legs. Onset: More than a month ago. Quality: Throbbing, Constant. Timing: Constant. Modifying factor(s): medications, positioning. Vitals:  height is $RemoveB'5\' 10"'UGRzbRFP$  (1.778 m) and weight is 250 lb (113.4 kg). Her temporal temperature is 97 F (36.1 C) (abnormal). Her blood pressure is 116/58 (abnormal) and her pulse is 61. Her respiration is 16 and oxygen saturation is 97%.   Reason for encounter: medication management.    S/p left common femoral vein stent with Dr Donzetta Matters yesterday for LLE thrombosis.  Having some acute post-procedural pain from yesteray She does have a history of 3 prior lumbar spine surgeries that were done by Dr. Danelle Berry.  She is having increased low back and bilateral leg pain in a dermatomal fashion.  She has an upcoming appointment with Dr. Cari Caraway. We have discussed injection therapies and spinal cord stimulation.  I encouraged her to keep her appointment with Dr. Cari Caraway and if he recommends any spinal injections or spinal cord stimulator trial to let me know as I can assist with these therapies.  Patient endorsed understanding. She was also inquiring about Qutenza and I informed her that it is not being covered for Medicaid.  We can reattempt approval in the next 12 to 16 weeks.    Pharmacotherapy Assessment    Analgesic: Hydrocodone 10 mg 4 times daily as needed, quantity 120/month; MME equals 40   Monitoring: [place] PMP: PDMP reviewed during this encounter.       Pharmacotherapy: No side-effects or adverse reactions reported. Compliance: No problems identified. Effectiveness: Clinically acceptable.  UDS:  Summary  Date Value Ref Range Status  04/10/2021 Note  Final    Comment:    ==================================================================== ToxASSURE Select 13 (MW) ==================================================================== Test  Result       Flag       Units  Drug  Present and Declared for Prescription Verification   Amphetamine                    6176         EXPECTED   ng/mg creat    Amphetamine is available as a schedule II prescription drug.    Hydrocodone                    2716         EXPECTED   ng/mg creat   Hydromorphone                  62           EXPECTED   ng/mg creat   Dihydrocodeine                 311          EXPECTED   ng/mg creat   Norhydrocodone                 1278         EXPECTED   ng/mg creat    Sources of hydrocodone include scheduled prescription medications.    Hydromorphone, dihydrocodeine and norhydrocodone are expected    metabolites of hydrocodone. Hydromorphone and dihydrocodeine are    also available as scheduled prescription medications.  Drug Absent but Declared for Prescription Verification   Alprazolam                     Not Detected UNEXPECTED ng/mg creat ==================================================================== Test                      Result    Flag   Units      Ref Range   Creatinine              82               mg/dL      >=20 ==================================================================== Declared Medications:  The flagging and interpretation on this report are based on the  following declared medications.  Unexpected results may arise from  inaccuracies in the declared medications.   **Note: The testing scope of this panel includes these medications:   Alprazolam (Xanax)  Amphetamine (Adderall)  Hydrocodone (Norco)   **Note: The testing scope of this panel does not include the  following reported medications:   Acetaminophen (Norco)  Aspirin  Atorvastatin (Lipitor)  Cetirizine (Zyrtec)  Eye Drops  Fluoxetine (Prozac)  Hydrochlorothiazide (Hydrodiuril)  Linaclotide (Linzess)  Methocarbamol (Robaxin)  Multivitamin  Omega-3 Fatty Acids  Pantoprazole (Protonix)  Potassium (Klor-Con)  Pregabalin (Lyrica)  Ropinirole (Requip)  Supplement  Warfarin  (Coumadin) ==================================================================== For clinical consultation, please call [phone]. ====================================================================       ROS  Constitutional: Denies any fever or chills Gastrointestinal: No reported hemesis, hematochezia, vomiting, or acute GI distress Musculoskeletal: bilateral leg pain, bilateral thigh calf pain, right greater than left, bilateral foot pain Neurological: No reported episodes of acute onset apraxia, aphasia, dysarthria, agnosia, amnesia, paralysis, loss of coordination, or loss of consciousness  Medication Review  ALPRAZolam, FLUoxetine, Garlic, HYDROcodone-acetaminophen, Omega-3 Fatty Acids, Semaglutide (1 MG/DOSE), Vibegron, amitriptyline, aspirin EC, aspirin-acetaminophen-caffeine, atorvastatin, cetirizine, colchicine, hydrochlorothiazide, levothyroxine, linaclotide, lisinopril, methocarbamol, multivitamin, pantoprazole, potassium chloride, pregabalin, rOPINIRole, and warfarin  History Review  Allergy: Jane [name] is allergic to aripiprazole, seroquel [quetiapine fumarate],  chlorpromazine, and gabapentin. Drug: Jane [name]  reports no history of drug use. Alcohol:  reports no history of alcohol use. Tobacco:  reports that she has never smoked. She has never used smokeless tobacco. Social: Jane [name]  reports that she has never smoked. She has never used smokeless tobacco. She reports that she does not drink alcohol and does not use drugs. Medical:  has a past medical history of ADD (attention deficit disorder), Anxiety, Back pain, Bilateral swelling of feet, Bipolar 1 disorder ([place]), Bipolar 1 disorder ([place]), Bipolar disorder ([place]), Chewing difficulty, Chronic fatigue syndrome, Chronic kidney disease, Constipation, Depression, Diabetes mellitus without complication ([place]), Dyspnea, GERD (gastroesophageal reflux disease), Headache, High cholesterol, History of blood clots, History  of DVT (deep vein thrombosis), Hypertension, Hypothyroidism, Joint pain, Neuropathy, Obsessive-compulsive disorder, Peripheral vascular disease ([place]), Prediabetes, Respiratory failure requiring intubation ([place]), Restless leg syndrome, Shortness of breath, Sleep apnea, Thrombocytopenia ([place]) (09/15/2019), Trigger thumb of left hand (01/2018), and Trigger thumb of right hand. Surgical: Jane [name]  has a past surgical history that includes Cholecystectomy; Trigger finger release (Right, 12/01/2017); Abdominal hysterectomy (06/2016); Lumbar fusion (11/21/2000); Lumbar spine surgery; Trigger finger release (Left, 01/26/2018); LOWER EXTREMITY VENOGRAPHY (N/A, 08/17/2018); Femoral-femoral Bypass Graft (Left, 11/24/2018); AV fistula placement (Left, 11/24/2018); LOWER EXTREMITY VENOGRAPHY (Bilateral, 03/09/2019); Patch angioplasty (Left, 03/30/2019); Ultrasound guidance for vascular access (Right, 03/30/2019); Femoral artery debridement (03/30/2019); Insertion of iliac stent (03/30/2019); Groin debridement (Left, 04/27/2019); Application if wound vac (Left, 04/27/2019); LOWER EXTREMITY VENOGRAPHY (Left, 08/16/2019); PERIPHERAL VASCULAR INTERVENTION (Left, 08/16/2019); Back surgery; Colonoscopy with propofol (N/A, 11/22/2019); Knee arthroscopy with meniscal repair (Left, 11/14/2020); LOWER EXTREMITY VENOGRAPHY (Left, 03/25/2022); PERIPHERAL VASCULAR THROMBECTOMY (Left, 03/25/2022); and PERIPHERAL VASCULAR INTERVENTION (Left, 03/25/2022). Family: family history includes ADD / ADHD in her daughter; Anxiety disorder in her daughter; Bipolar disorder in her daughter, maternal aunt, and mother; Depression in her mother; Diabetes in her father; Drug abuse in her daughter and daughter; Heart disease in her brother, father, and mother; Hyperlipidemia in her brother, father, and mother; Hypertension in her brother, father, mother, and sister; Obesity in her father and mother; Sleep apnea in her father and mother; Stroke in her mother;  Suicidality in her maternal aunt.  Laboratory Chemistry Profile   Renal Lab Results  Component Value Date   BUN 31 (H) 03/25/2022   CREATININE 1.60 (H) 03/25/2022   BCR 21 03/22/2021   GFRAA 53 (L) 07/18/2020   GFRNONAA 52 (L) 04/12/2021     Hepatic Lab Results  Component Value Date   AST 17 04/12/2021   ALT 20 04/12/2021   ALBUMIN 3.8 04/12/2021   ALKPHOS 95 04/12/2021   HCVAB NON REACTIVE 11/06/2021     Electrolytes Lab Results  Component Value Date   NA 141 03/25/2022   K 4.1 03/25/2022   CL 100 03/25/2022   CALCIUM 9.5 04/12/2021   MG 2.2 04/04/2019   PHOS 4.0 04/04/2019     Bone Lab Results  Component Value Date   VD25OH 47.10 02/21/2022     Inflammation (CRP: Acute Phase) (ESR: Chronic Phase) Lab Results  Component Value Date   CRP 11 (H) 02/16/2020   ESRSEDRATE 33 02/16/2020   LATICACIDVEN 1.2 01/05/2020       Note: Above Lab results reviewed.  Recent Imaging Review  PERIPHERAL VASCULAR CATHETERIZATION Images from the original result were not included.  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 Donzetta Matters, MD Procedure Performed: 1.  Ultrasound-guided cannulation left small saphenous vein 2.  Left lower extremity and central venography 3.  Intravascular ultrasound left femoral, left common femoral, left  external and common iliac veins and IVC 4.  Stent of left common femoral vein with 14 x 80 mm Abre 5.  Stent of left common and external iliac veins with 16 x 179mm Abre 6.  Mechanical thrombectomy left common and external iliac veins and left  common femoral vein stent and left femoral vein with Inari Clottriever 7.  Moderate sedation with fentanyl and Versed for 69 minutes  Indications: 56 year old female with  extensive history of May Thurner  syndrome.  She has a history that includes initially a left to right  femorofemoral bypass for occluded left common and external iliac veins and  subsequent cutdown left common femoral vein complicated with rupture that  required Viabahn stenting as well as proximal extension and then later had  distal extension of the stents with self-expanding noncovered stents.  She  now has recurrent symptoms with occluded stents and is indicated for  possible mechanical thrombectomy.  Findings: The femoral vein was patent.  The stents were all occluded but  there was a channel higher up.  IVC was patent.  We performed mechanical  thrombectomy and then I did have to place 2 new stents 1 in the common  femoral vein and one in the common iliac to external leg veins on the  left.  At completion all the stents were patent there was brisk flow  through the stents without any residual stenosis where previously they  were occluded.  She will restart Coumadin tonight.  She is on aspirin therapy.   Procedure:  The patient was identified in the holding area and taken to  room 8.  The patient was then placed prone on the table and prepped and  draped in the usual sterile fashion.  A time out was called.  Ultrasound  was used to evaluate the left small saphenous vein.  This had some chronic  thickening but was compressible.  The area was anesthetized 1% lidocaine  cannulated with micropuncture needle followed by wire sheath.  Images  saved the permanent record.  I placed a Glidewire advantage followed by 5  [place] sheath and then performed venography.  Stents were obviously  occluded.  Patient was initially administered 10,000's of heparin and  later initial 5000.  I was able to cannulate the stents with a Berenstein  catheter across intraluminally with the catheter and wire and performed  angiography centrally which demonstrated that we were within the IVC.  I  then placed  the wire into the left IJ and this was marked on the drapes.   We then exchanged for an 8 French sheath and then performed intravascular  ultrasound from the IVC all the way down through the femoral vein with the  above findings of occluded left common and external leg vein stents and  left common femoral vein stent.  We then exchanged for the Inari sheath  under fluoroscopic guidance.  We performed mechanical thrombectomy with 3  passes of the clot Treiver with good return of chronic appearing thrombus.   Completion intravascular ultrasound demonstrated an area of disrupted  stent in the common femoral vein this was primarily stented with a 14 x 80  stent and then there was a residual chronic thrombus within the common and  external  leg vein stents and this was primarily stented with a 16 x 100  stent.  Proximally we dilated with a 14 mm balloon and more peripherally  with a 12 mm balloon.  Completion demonstrated no residual stenosis in the  femoral vein was patent by venogram with minimal collaterals from the  stenting although the profunda and saphenous veins are patent leading up  to the stents.  Satisfied with this we remove the wire and the sheath and  pressure was held until hemostasis obtained.  She did tolerate procedure  well without any complication.  Contrast: 40cc  Brandon C. Donzetta Matters, MD [facility] Office: [phone] Pager: [phone]  Note: Reviewed        Physical Exam  General appearance: Well nourished, well developed, and well hydrated. In no apparent acute distress Mental status: Alert, oriented x 3 (person, place, & time)       Respiratory: No evidence of acute respiratory distress Eyes: PERLA Vitals: BP (!) 116/58 (BP Location: Right Arm, Patient Position: Sitting, Cuff Size: Large)   Pulse 61   Temp (!) 97 F (36.1 C) (Temporal)   Resp 16   Ht $R'5\' 10"'ca$  (1.778 m)   Wt 250 lb (113.4 kg)   LMP  (LMP Unknown)   SpO2 97%    BMI 35.87 kg/m  BMI: Estimated body mass index is 35.87 kg/m as calculated from the following:   Height as of this encounter: $RemoveBeforeD'5\' 10"'DEvhjYdQIoUSGn$  (1.778 m).   Weight as of this encounter: 250 lb (113.4 kg). Ideal: Ideal body weight: 68.5 kg (151 lb 0.2 oz) Adjusted ideal body weight: 86.5 kg (190 lb 9.7 oz)  Lumbar Spine Area Exam  Skin & Axial Inspection: Well healed scar from previous spine surgery detected Alignment: Asymmetric Functional ROM: Decreased ROM       Stability: No instability detected Muscle Tone/Strength: Functionally intact. No obvious neuro-muscular anomalies detected. Sensory (Neurological): Dermatomal pain pattern   Provocative Tests: Hyperextension/rotation test: (+) bilaterally for facet joint pain. Lumbar quadrant test (Kemp's test): (+) bilaterally for facet joint pain.     Gait & Posture Assessment  Ambulation: Limited Gait: Antalgic gait (limping) Posture: Difficulty standing up straight, due to pain  Lower Extremity Exam      Side: Right lower extremity   Side: Left lower extremity  Stability: No instability observed           Stability: No instability observed          Skin & Extremity Inspection: Skin color, temperature, and hair growth are WNL. No peripheral edema or cyanosis. No masses, redness, swelling, asymmetry, or associated skin lesions. No contractures.   Skin & Extremity Inspection: Skin color, temperature, and hair growth are WNL. No peripheral edema or cyanosis. No masses, redness, swelling, asymmetry, or associated skin lesions. No contractures.  Functional ROM: Decreased ROM for hip and knee joints           Functional ROM: Decreased ROM for hip and knee joints, pain with weight bearing and lateral rotation          Muscle Tone/Strength: Functionally intact. No obvious neuro-muscular anomalies detected.   Muscle Tone/Strength: Functionally intact. No obvious neuro-muscular anomalies detected.  Sensory (Neurological): Neurogenic pain pattern         Sensory (Neurological):  Arthropathic arthralgia for left knee severe pain with weightbearing        DTR: Patellar: deferred today Achilles: deferred today Plantar: deferred today   DTR: Patellar: deferred today Achilles: deferred  today Plantar: deferred today  Palpation: No palpable anomalies   Palpation: No palpable anomalies    Assessment   Status Diagnosis  Persistent Persistent Persistent 1. Chronic pain syndrome   2. Failed back surgical syndrome   3. Neurogenic pain   4. DDD (degenerative disc disease), lumbar   5. Pain management contract signed   6. Arthritis of left knee   7. May-Thurner syndrome   8. DDD (degenerative disc disease), cervical   9. Diabetic peripheral neuropathy ([place])   10. Chronic musculoskeletal pain         Plan of Care  Jane [name] has a current medication list which includes the following long-term medication(s): amitriptyline, atorvastatin, cetirizine, colchicine, fluoxetine, fluoxetine, hydrochlorothiazide, levothyroxine, linzess, lisinopril, pantoprazole, potassium chloride, pregabalin, ropinirole, and warfarin.  Pharmacotherapy (Medications Ordered): Meds ordered this encounter  Medications   HYDROcodone-acetaminophen (NORCO) 10-325 MG tablet    Sig: Take 1 tablet by mouth every 6 (six) hours as needed. To last 30 days from fill date    Dispense:  120 tablet    Refill:  0   HYDROcodone-acetaminophen (NORCO) 10-325 MG tablet    Sig: Take 1 tablet by mouth every 6 (six) hours as needed. To last 30 days from fill date    Dispense:  120 tablet    Refill:  0   HYDROcodone-acetaminophen (NORCO) 10-325 MG tablet    Sig: Take 1 tablet by mouth every 6 (six) hours as needed. To last 30 days from fill date    Dispense:  120 tablet    Refill:  0   DISCONTD: methocarbamol (ROBAXIN) 750 MG tablet    Sig: Take 1 tablet (750 mg total) by mouth 2 (two) times daily as needed for muscle spasms.    Dispense:  60 tablet    Refill:  2    Do  not place this medication, or any other prescription from our practice, on "Automatic Refill". Patient may have prescription filled one day early if pharmacy is closed on scheduled refill date.   methocarbamol (ROBAXIN) 750 MG tablet    Sig: Take 1 tablet (750 mg total) by mouth 2 (two) times daily as needed for muscle spasms.    Dispense:  60 tablet    Refill:  5    Do not place this medication, or any other prescription from our practice, on "Automatic Refill". Patient may have prescription filled one day early if pharmacy is closed on scheduled refill date.   Qutenza, capsaicin 8% topical treatment for painful diabetic neuropathy of bilateral feet denied by Medicaid (will reattempt submission in 3 to 4 months) Continue with psychiatric care Continue with multimodal analgesics including Lyrica, Robaxin as needed.  Refill of Robaxin as above. Follow-up for spinal injections or spinal cord stimulator trial if recommended by neurosurgery.  We will need to get clearance to stop anticoagulant prior to any neuraxial procedures as the patient is high risk to be off.   Orders Placed This Encounter  Procedures   ToxASSURE Select 13 (MW), Urine    Volume: 30 ml(s). Minimum 3 ml of urine is needed. Document temperature of fresh sample. Indications: Long term (current) use of opiate analgesic [phone])    Order Specific Question:   Release to patient    Answer:   Immediate      Follow-up plan:   Return in about 3 months (around 06/26/2022) for Medication Management, in person.   Recent Visits Date Type Provider Dept  12/31/21 Office Visit Gillis Santa, MD [facility]  Showing recent visits within past 90 days and meeting all other requirements Today's Visits Date Type Provider Dept  03/26/22 Office Visit Gillis Santa, MD [facility]  Showing today's visits and meeting all other requirements Future Appointments Date Type Provider Dept  06/20/22 Appointment Gillis Santa, MD [facility]  Showing future appointments within next 90 days and meeting all other requirements  I discussed the assessment and treatment plan with the patient. The patient was provided an opportunity to ask questions and all were answered. The patient agreed with the plan and demonstrated an understanding of the instructions.  Patient advised to call back or seek an in-person evaluation if the symptoms or condition worsens.  Duration of encounter: 57minutes.  Note by: Gillis Santa, MD Date: 03/26/2022; Time: 11:36 AM

## 2022-03-26 NOTE — Progress Notes (Signed)
Nursing Pain Medication Assessment:  Safety precautions to be maintained throughout the outpatient stay will include: orient to surroundings, keep bed in low position, maintain call bell within reach at all times, provide assistance with transfer out of bed and ambulation.  Medication Inspection Compliance: Pill count conducted under aseptic conditions, in front of the patient. Neither the pills nor the bottle was removed from the patient's sight at any time. Once count was completed pills were immediately returned to the patient in their original bottle.  Medication: Hydrocodone/APAP Pill/Patch Count:  20 of 120 pills remain Pill/Patch Appearance: Markings consistent with prescribed medication Bottle Appearance: Standard pharmacy container. Clearly labeled. Filled Date: 04 / 28 / 2023 Last Medication intake:  Today

## 2022-03-26 NOTE — Telephone Encounter (Signed)
Spoke with patient pharmacy and spoke with Summer and informed her with what provider stated and she verbalized understanding and agreed.

## 2022-03-27 ENCOUNTER — Ambulatory Visit: Payer: Medicaid Other

## 2022-03-31 ENCOUNTER — Other Ambulatory Visit: Payer: Self-pay | Admitting: Family Medicine

## 2022-04-04 ENCOUNTER — Encounter (HOSPITAL_COMMUNITY): Payer: Self-pay | Admitting: Vascular Surgery

## 2022-04-04 DIAGNOSIS — F3132 Bipolar disorder, current episode depressed, moderate: Secondary | ICD-10-CM | POA: Diagnosis not present

## 2022-04-05 DIAGNOSIS — F3132 Bipolar disorder, current episode depressed, moderate: Secondary | ICD-10-CM | POA: Diagnosis not present

## 2022-04-05 LAB — TOXASSURE SELECT 13 (MW), URINE

## 2022-04-08 ENCOUNTER — Telehealth: Payer: Self-pay

## 2022-04-08 ENCOUNTER — Encounter (HOSPITAL_COMMUNITY): Payer: Self-pay | Admitting: Psychiatry

## 2022-04-08 ENCOUNTER — Telehealth (INDEPENDENT_AMBULATORY_CARE_PROVIDER_SITE_OTHER): Payer: Medicaid Other | Admitting: Psychiatry

## 2022-04-08 DIAGNOSIS — F3130 Bipolar disorder, current episode depressed, mild or moderate severity, unspecified: Secondary | ICD-10-CM

## 2022-04-08 DIAGNOSIS — F900 Attention-deficit hyperactivity disorder, predominantly inattentive type: Secondary | ICD-10-CM

## 2022-04-08 DIAGNOSIS — F5105 Insomnia due to other mental disorder: Secondary | ICD-10-CM | POA: Diagnosis not present

## 2022-04-08 DIAGNOSIS — Z7901 Long term (current) use of anticoagulants: Secondary | ICD-10-CM

## 2022-04-08 MED ORDER — FLUOXETINE HCL 40 MG PO CAPS: 40.0000 mg | ORAL_CAPSULE | Freq: Every day | ORAL | 2 refills | Status: DC

## 2022-04-08 MED ORDER — AMITRIPTYLINE HCL 50 MG PO TABS: 50.0000 mg | ORAL_TABLET | Freq: Every day | ORAL | 2 refills | Status: DC

## 2022-04-08 MED ORDER — ALPRAZOLAM 1 MG PO TABS: 1.0000 mg | ORAL_TABLET | Freq: Two times a day (BID) | ORAL | 2 refills | Status: DC | PRN

## 2022-04-08 MED ORDER — FLUOXETINE HCL 20 MG PO CAPS: 20.0000 mg | ORAL_CAPSULE | Freq: Every day | ORAL | 0 refills | Status: DC

## 2022-04-08 NOTE — Telephone Encounter (Signed)
Noted  

## 2022-04-08 NOTE — Progress Notes (Signed)
Virtual Visit via Video Note  I connected with Jane [name] on 04/08/22 at 11:20 AM EDT by a video enabled telemedicine application and verified that I am speaking with the correct person using two identifiers.  Location: Patient: home Provider: office   I discussed the limitations of evaluation and management by telemedicine and the availability of in person appointments. The patient expressed understanding and agreed to proceed.      I discussed the assessment and treatment plan with the patient. The patient was provided an opportunity to ask questions and all were answered. The patient agreed with the plan and demonstrated an understanding of the instructions.   The patient was advised to call back or seek an in-person evaluation if the symptoms worsen or if the condition fails to improve as anticipated.  I provided 20 minutes of non-face-to-face time during this encounter.   [name], MD  [facility] MD/PA/NP OP Progress Note  04/08/2022 11:38 AM Jane [name]  MRN:  [id number]  Chief Complaint:  Chief Complaint  Patient presents with   Anxiety   Depression   ADHD   Follow-up   HPI: This patient is a 56 year old divorced white female who lives alone in [place].  She is on disability due to bipolar disorder.  The patient returns for follow-up after 3 months.  She states that she is doing okay but still suffering from a lot of chronic pain and peripheral neuropathy.  She is on Elavil 25 mg at night but still not sleeping well.  I offered to increase this and she thinks this is a reasonable idea.  Her family members including her sister and daughters are still not speaking to her.  She claims that she has come to terms with this.  She has lost about 40 pounds.  She is still not started at the [facility] and claims that she "needs more motivation."  She denies significant depression anxiety thoughts of self-harm or manic symptoms such as agitation.  She is focusing well with the  Adderall. Visit Diagnosis:    ICD-10-CM   1. Bipolar I disorder, most recent episode depressed ([place])  F31.30     2. Attention deficit hyperactivity disorder (ADHD), predominantly inattentive type  F90.0     3. Insomnia due to mental disorder  F51.05       Past Psychiatric History: Long-term outpatient treatment  Past Medical History:  Past Medical History:  Diagnosis Date   ADD (attention deficit disorder)    Anxiety    Back pain    Bilateral swelling of feet    Bipolar 1 disorder (HCC)    Bipolar 1 disorder (HCC)    Bipolar disorder (HCC)    Chewing difficulty    Chronic fatigue syndrome    Chronic kidney disease    Stage 3 kidney disease;dx by Dr. Sinda [name].    Constipation    Depression    Diabetes mellitus without complication (HCC)    diet controlled   Dyspnea    with exertion   GERD (gastroesophageal reflux disease)    Headache    migraines   High cholesterol    History of blood clots    History of DVT (deep vein thrombosis)    left leg   Hypertension    states under control with meds., has been on med. x 2 years   Hypothyroidism    Joint pain    Neuropathy    Obsessive-compulsive disorder    Peripheral vascular disease (HCC)    Prediabetes  Respiratory failure requiring intubation ([place])    Restless leg syndrome    Shortness of breath    Sleep apnea    Thrombocytopenia ([place]) 09/15/2019   Trigger thumb of left hand 01/2018   Trigger thumb of right hand     Past Surgical History:  Procedure Laterality Date   ABDOMINAL HYSTERECTOMY  06/2016   complete   APPLICATION OF WOUND VAC Left 04/27/2019   Procedure: APPLICATION OF WOUND VAC LEFT GROIN;  Surgeon: Jane Sandy, MD;  Location: [place];  Service: Vascular;  Laterality: Left;   AV FISTULA PLACEMENT Left 11/24/2018   Procedure: ARTERIOVENOUS (AV) FISTULA CREATION LEFT SFA TO LEFT FEMORAL VEIN;  Surgeon: Jane Sandy, MD;  Location: [place];  Service: Vascular;  Laterality:  Left;   BACK SURGERY     CHOLECYSTECTOMY     COLONOSCOPY WITH PROPOFOL N/A 11/22/2019   Procedure: COLONOSCOPY WITH PROPOFOL;  Surgeon: Jane Bellows, MD;  Location[facility] ENDOSCOPY;  Service: Gastroenterology;  Laterality: N/A;   FEMORAL ARTERY EXPLORATION  03/30/2019   Procedure: Left Common Femoral Artery and Vein Exploration;  Surgeon: Jane Sandy, MD;  Location: [place];  Service: Vascular;;   FEMORAL-FEMORAL BYPASS GRAFT Left 11/24/2018   Procedure: BYPASS GRAFT FEMORAL-FEMORAL VENOUS LEFT TO RIGHT PALMA PROCEDURE USING CRYOVEIN;  Surgeon: Jane Sandy, MD;  Location: [place];  Service: Vascular;  Laterality: Left;   GROIN DEBRIDEMENT Left 04/27/2019   Procedure: GROIN DEBRIDEMENT;  Surgeon: Jane Sandy, MD;  Location: [place];  Service: Vascular;  Laterality: Left;   INSERTION OF ILIAC STENT  03/30/2019   Procedure: Stent of left common, external iliac veins and left common femoral vein;  Surgeon: Jane Sandy, MD;  Location: [place];  Service: Vascular;;   KNEE ARTHROSCOPY WITH MENISCAL REPAIR Left 11/14/2020   Procedure: LEFT KNEE ARTHROSCOPY WITH PARTIAL MEDIAL MENISCECTOMY;  Surgeon: Jane Civil, MD;  Location: [facility];  Service: Orthopedics;  Laterality: Left;   LOWER EXTREMITY VENOGRAPHY N/A 08/17/2018   Procedure: LOWER EXTREMITY VENOGRAPHY - Central Venogram;  Surgeon: Jane Sandy, MD;  Location: [place] CV LAB;  Service: Cardiovascular;  Laterality: N/A;   LOWER EXTREMITY VENOGRAPHY Bilateral 03/09/2019   Procedure: LOWER EXTREMITY VENOGRAPHY;  Surgeon: Jane Sandy, MD;  Location: [place] CV LAB;  Service: Cardiovascular;  Laterality: Bilateral;   LOWER EXTREMITY VENOGRAPHY Left 08/16/2019   Procedure: LOWER EXTREMITY VENOGRAPHY;  Surgeon: Jane Sandy, MD;  Location: [place] CV LAB;  Service: Cardiovascular;  Laterality: Left;   LOWER EXTREMITY VENOGRAPHY Left 03/25/2022   Procedure:  LOWER EXTREMITY VENOGRAPHY;  Surgeon: Jane Sandy, MD;  Location: [place] CV LAB;  Service: Cardiovascular;  Laterality: Left;  IVUS   LUMBAR FUSION  11/21/2000   L5-S1   LUMBAR SPINE SURGERY     x 2 others   PATCH ANGIOPLASTY Left 03/30/2019   Procedure: Patch Angioplasty of the Left Common Femoral Vein using Venosure Biologic patch;  Surgeon: Jane Sandy, MD;  Location: [place];  Service: Vascular;  Laterality: Left;   PERIPHERAL VASCULAR INTERVENTION Left 08/16/2019   Procedure: PERIPHERAL VASCULAR INTERVENTION;  Surgeon: Jane Sandy, MD;  Location: [place] CV LAB;  Service: Cardiovascular;  Laterality: Left;  common femoral/femoral vein stent   PERIPHERAL VASCULAR INTERVENTION Left 03/25/2022   Procedure: PERIPHERAL VASCULAR INTERVENTION;  Surgeon: Jane Sandy, MD;  Location: [place] CV LAB;  Service: Cardiovascular;  Laterality: Left;  COMMON FEMORAL VEIN   PERIPHERAL VASCULAR  THROMBECTOMY Left 03/25/2022   Procedure: PERIPHERAL VASCULAR THROMBECTOMY;  Surgeon: Jane Sandy, MD;  Location: [place] CV LAB;  Service: Cardiovascular;  Laterality: Left;  EXTREMITY/IVC   TRIGGER FINGER RELEASE Right 12/01/2017   Procedure: RELEASE TRIGGER FINGER/A-1 PULLEY RIGHT THUMB;  Surgeon: Leanora Cover, MD;  Location: [place];  Service: Orthopedics;  Laterality: Right;   TRIGGER FINGER RELEASE Left 01/26/2018   Procedure: LEFT TRIGGER THUMB RELEASE;  Surgeon: Leanora Cover, MD;  Location: [place];  Service: Orthopedics;  Laterality: Left;   ULTRASOUND GUIDANCE FOR VASCULAR ACCESS Right 03/30/2019   Procedure: Ultrasound-guided cannulation right internal jugular vein;  Surgeon: Jane Sandy, MD;  Location: [place];  Service: Vascular;  Laterality: Right;    Family Psychiatric History: See below  Family History:  Family History  Problem Relation Age of Onset   Heart disease Mother     Hyperlipidemia Mother    Hypertension Mother    Bipolar disorder Mother    Stroke Mother    Depression Mother    Sleep apnea Mother    Obesity Mother    Diabetes Father    Heart disease Father    Hyperlipidemia Father    Hypertension Father    Sleep apnea Father    Obesity Father    Drug abuse Daughter    ADD / ADHD Daughter    Drug abuse Daughter    Anxiety disorder Daughter    Bipolar disorder Daughter    Hypertension Sister    Hypertension Brother    Hyperlipidemia Brother    Heart disease Brother    Bipolar disorder Maternal Aunt    Suicidality Maternal Aunt     Social History:  Social History   Socioeconomic History   Marital status: Divorced    Spouse name: Not on file   Number of children: 3   Years of education: Not on file   Highest education level: Not on file  Occupational History   Not on file  Tobacco Use   Smoking status: Never   Smokeless tobacco: Never  Vaping Use   Vaping Use: Never used  Substance and Sexual Activity   Alcohol use: No   Drug use: No   Sexual activity: Not Currently    Partners: Male    Birth control/protection: Surgical  Other Topics Concern   Not on file  Social History Narrative   Not on file   Social Determinants of Health   Financial Resource Strain: Not on file  Food Insecurity: Not on file  Transportation Needs: Not on file  Physical Activity: Not on file  Stress: Not on file  Social Connections: Not on file    Allergies:  Allergies  Allergen Reactions   Aripiprazole Other (See Comments)    BECOMES  VIOLENT    Seroquel [Quetiapine Fumarate] Other (See Comments)    BECOMES VIOLENT   Chlorpromazine Other (See Comments)    SEVERE ANXIETY    Gabapentin Other (See Comments)    NIGHTMARES     Metabolic Disorder Labs: Lab Results  Component Value Date   HGBA1C 6.2 (H) 11/06/2021   MPG 131.24 11/06/2021   MPG 134.11 03/26/2019   No results found for: PROLACTIN Lab Results  Component Value Date    CHOL 191 11/06/2021   TRIG 187 (H) 11/06/2021   HDL 38 (L) 11/06/2021   CHOLHDL 5.0 11/06/2021   VLDL 37 11/06/2021   LDLCALC 116 (H) 11/06/2021   LDLCALC 114 (H) 03/29/2020   Lab  Results  Component Value Date   TSH 0.107 (L) 05/18/2020   TSH 0.275 (L) 03/29/2020    Therapeutic Level Labs: No results found for: LITHIUM Lab Results  Component Value Date   VALPROATE 61.2 03/23/2014   No components found for:  CBMZ  Current Medications: Current Outpatient Medications  Medication Sig Dispense Refill   amitriptyline (ELAVIL) 50 MG tablet Take 1 tablet (50 mg total) by mouth at bedtime. Dosage increase 30 tablet 2   ALPRAZolam (XANAX) 1 MG tablet Take 1 tablet (1 mg total) by mouth 2 (two) times daily as needed. for anxiety 60 tablet 2   amitriptyline (ELAVIL) 25 MG tablet Take 1 tablet (25 mg total) by mouth at bedtime. 90 tablet 1   aspirin EC 81 MG tablet Take 81 mg by mouth daily.     aspirin-acetaminophen-caffeine (EXCEDRIN MIGRAINE) 250-250-65 MG tablet Take 2 tablets by mouth every 6 (six) hours as needed for headache.     atorvastatin (LIPITOR) 40 MG tablet Take 1 tablet (40 mg total) by mouth daily. 90 tablet 1   cetirizine (ZYRTEC) 10 MG tablet TAKE 1 TABLET BY MOUTH DAILY 90 tablet 1   colchicine 0.6 MG tablet Take 1.'2mg'$  (2 tablets) then 0.'6mg'$  (1 tablet) 1 hour after. Then, take 1 tablet every day for 7 days. 10 tablet 2   FLUoxetine (PROZAC) 20 MG capsule Take 1 capsule (20 mg total) by mouth daily. Take with 40 mg to equal 60 mg daily 30 capsule 0   FLUoxetine (PROZAC) 40 MG capsule Take 1 capsule (40 mg total) by mouth daily. Take with 20 mg to equal 60 mg daily 30 capsule 2   GARLIC PO Take 1 tablet by mouth daily.     hydrochlorothiazide (HYDRODIURIL) 25 MG tablet TAKE 1 TABLET BY MOUTH DAILY Please schedule office visit BEFORE any future refills 90 tablet 1   HYDROcodone-acetaminophen (NORCO) 10-325 MG tablet Take 1 tablet by mouth every 6 (six) hours as needed. To  last 30 days from fill date 120 tablet 0   [START ON 04/30/2022] HYDROcodone-acetaminophen (NORCO) 10-325 MG tablet Take 1 tablet by mouth every 6 (six) hours as needed. To last 30 days from fill date 120 tablet 0   [START ON 05/30/2022] HYDROcodone-acetaminophen (NORCO) 10-325 MG tablet Take 1 tablet by mouth every 6 (six) hours as needed. To last 30 days from fill date 120 tablet 0   levothyroxine (SYNTHROID) 125 MCG tablet Take 125 mcg by mouth daily before breakfast.     LINZESS 145 MCG CAPS capsule TAKE ONE CAPSULE BY MOUTH DAILY 90 capsule 3   lisinopril (ZESTRIL) 5 MG tablet Take 1 tablet (5 mg total) by mouth daily. 90 tablet 1   methocarbamol (ROBAXIN) 750 MG tablet Take 1 tablet (750 mg total) by mouth 2 (two) times daily as needed for muscle spasms. 60 tablet 5   Multiple Vitamin (MULTIVITAMIN) capsule Take 1 capsule by mouth daily.     Omega-3 Fatty Acids (OMEGA-3 FISH OIL PO) Take 1 capsule by mouth daily.     pantoprazole (PROTONIX) 40 MG tablet TAKE 1 TABLET BY MOUTH DAILY 90 tablet 3   potassium chloride (KLOR-CON M) 10 MEQ tablet TAKE 1 TABLET BY MOUTH THREE TIMES DAILY 90 tablet 1   pregabalin (LYRICA) 100 MG capsule Take 1 capsule (100 mg total) by mouth 4 (four) times daily. 120 capsule 1   rOPINIRole (REQUIP) 2 MG tablet TAKE 1 TABLET BY MOUTH TWICE DAILY 180 tablet 3   Semaglutide, 1  MG/DOSE, 4 MG/3ML SOPN Inject 1 mg as directed once a week. 3 mL 0   Vibegron (GEMTESA) 75 MG TABS Take 75 mg by mouth daily. 30 tablet 11   warfarin (COUMADIN) 5 MG tablet TAKE 1 AND 1/2 TABLETS BY MOUTH ON TUEDAYS AND THURSDAYS, THEN TAKE 1 TABLET DAILY ON ALL OTHER DAYS (Patient taking differently: Take 2.5-5 mg by mouth See admin instructions. Take 5 mg daily except on Wednesdays take 2.5 mg dose) 60 tablet 3   No current facility-administered medications for this visit.     Musculoskeletal: Strength & Muscle Tone: na Gait & Station: na Patient leans: N/A  Psychiatric Specialty  Exam: Review of Systems  Constitutional:  Positive for fatigue.  Musculoskeletal:  Positive for arthralgias, back pain, gait problem and myalgias.  Neurological:  Positive for numbness.  Psychiatric/Behavioral:  Positive for sleep disturbance.   All other systems reviewed and are negative.  There were no vitals taken for this visit.There is no height or weight on file to calculate BMI.  General Appearance: Casual and Fairly Groomed  Eye Contact:  Good  Speech:  Clear and Coherent  Volume:  Normal  Mood:  Dysphoric  Affect:  Appropriate and Congruent  Thought Process:  Goal Directed  Orientation:  Full (Time, Place, and Person)  Thought Content: WDL   Suicidal Thoughts:  No  Homicidal Thoughts:  No  Memory:  Immediate;   Good Recent;   Good Remote;   Good  Judgement:  Good  Insight:  Fair  Psychomotor Activity:  Decreased  Concentration:  Concentration: Good and Attention Span: Good  Recall:  Good  Fund of Knowledge: Good  Language: Good  Akathisia:  No  Handed:  Right  AIMS (if indicated): not done  Assets:  Communication Skills Desire for Improvement Resilience Social Support Talents/Skills  ADL's:  Intact  Cognition: WNL  Sleep:  Poor   Screenings: PHQ2-9    Flowsheet Row Video Visit from 04/08/2022 in [place] Office Visit from 03/26/2022 in [place] Office Visit from 02/22/2022 in [place] Video Visit from 01/11/2022 in [place] Office Visit from 10/22/2021 in [place]  PHQ-2 Total Score 1 0 2 0 2  PHQ-9 Total Score -- -- 9 -- 17      Flowsheet Row Video Visit from 04/08/2022 in [place] Admission (Discharged) from 03/25/2022 in [place] CATH LAB Video Visit from 01/11/2022 in [place] No Risk No Risk No Risk        Assessment and Plan: This patient is a 56 year old female with a history of depression anxiety and ADD, possible bipolar disorder.  She is not sleeping well so we will increase amitriptyline to 50 mg at bedtime.  She will continue Prozac 60 mg daily for depression, Xanax 1 mg twice daily as needed for anxiety and Adderall 30 mg twice daily for ADD.  She knows not to combine Xanax with any pain medicine and she states that she does not take it every day.  She will return to see me in 26-month Collaboration of Care: Collaboration of Care: Primary Care Provider AEB notes are available to PCP through the epic system  Patient/Guardian was advised Release of Information must be obtained prior to any record release in order to collaborate their care with an outside provider. Patient/Guardian was advised if they have  not already done so to contact the registration department to sign all necessary forms in order for [place] to release information regarding their care.   Consent: Patient/Guardian gives verbal consent for treatment and assignment of benefits for services provided during this visit. Patient/Guardian expressed understanding and agreed to proceed.    [name], MD 04/08/2022, 11:38 AM

## 2022-04-08 NOTE — Telephone Encounter (Signed)
Tye Maryland, Pharmacist at [facility] states that the brand name for Warfarin has changed. She state that she made pt aware as well.

## 2022-04-09 ENCOUNTER — Other Ambulatory Visit (HOSPITAL_COMMUNITY): Payer: Self-pay | Admitting: Psychiatry

## 2022-04-09 MED ORDER — FLUOXETINE HCL 40 MG PO CAPS
40.0000 mg | ORAL_CAPSULE | Freq: Every day | ORAL | 2 refills | Status: DC
Start: 2022-04-09 — End: 2022-07-01

## 2022-04-09 MED ORDER — FLUOXETINE HCL 20 MG PO CAPS: 20.0000 mg | ORAL_CAPSULE | Freq: Every day | ORAL | 2 refills | Status: DC

## 2022-04-10 ENCOUNTER — Ambulatory Visit: Payer: Medicaid Other

## 2022-04-10 DIAGNOSIS — F3132 Bipolar disorder, current episode depressed, moderate: Secondary | ICD-10-CM | POA: Diagnosis not present

## 2022-04-10 DIAGNOSIS — Z86718 Personal history of other venous thrombosis and embolism: Secondary | ICD-10-CM | POA: Diagnosis not present

## 2022-04-10 LAB — POCT INR
INR: 1.8 — AB (ref 2.0–3.0)
PT: 22.1

## 2022-04-10 NOTE — Patient Instructions (Signed)
Description   Changes made:5 mg daily per Dr. Cloyd Stagers in 3 weeks.

## 2022-04-11 DIAGNOSIS — L237 Allergic contact dermatitis due to plants, except food: Secondary | ICD-10-CM | POA: Diagnosis not present

## 2022-04-12 DIAGNOSIS — G629 Polyneuropathy, unspecified: Secondary | ICD-10-CM | POA: Diagnosis not present

## 2022-04-12 DIAGNOSIS — G894 Chronic pain syndrome: Secondary | ICD-10-CM | POA: Diagnosis not present

## 2022-04-12 DIAGNOSIS — M5416 Radiculopathy, lumbar region: Secondary | ICD-10-CM | POA: Diagnosis not present

## 2022-04-12 NOTE — Telephone Encounter (Signed)
This was addressed at patients office visit scheduled after this My Chart message.

## 2022-04-15 ENCOUNTER — Ambulatory Visit (HOSPITAL_COMMUNITY): Payer: Medicaid Other | Attending: Orthopedic Surgery | Admitting: Physical Therapy

## 2022-04-15 ENCOUNTER — Other Ambulatory Visit (HOSPITAL_COMMUNITY): Payer: Self-pay

## 2022-04-15 ENCOUNTER — Encounter (HOSPITAL_COMMUNITY): Payer: Self-pay | Admitting: Physical Therapy

## 2022-04-15 ENCOUNTER — Encounter (INDEPENDENT_AMBULATORY_CARE_PROVIDER_SITE_OTHER): Payer: Self-pay | Admitting: Family Medicine

## 2022-04-15 ENCOUNTER — Ambulatory Visit (INDEPENDENT_AMBULATORY_CARE_PROVIDER_SITE_OTHER): Payer: Medicaid Other | Admitting: Family Medicine

## 2022-04-15 VITALS — BP 114/65 | HR 65 | Temp 98.1°F | Ht 70.0 in | Wt 247.0 lb

## 2022-04-15 DIAGNOSIS — Z6835 Body mass index (BMI) 35.0-35.9, adult: Secondary | ICD-10-CM

## 2022-04-15 DIAGNOSIS — Z7985 Long-term (current) use of injectable non-insulin antidiabetic drugs: Secondary | ICD-10-CM | POA: Diagnosis not present

## 2022-04-15 DIAGNOSIS — E1165 Type 2 diabetes mellitus with hyperglycemia: Secondary | ICD-10-CM

## 2022-04-15 DIAGNOSIS — M79672 Pain in left foot: Secondary | ICD-10-CM | POA: Diagnosis not present

## 2022-04-15 DIAGNOSIS — E669 Obesity, unspecified: Secondary | ICD-10-CM | POA: Diagnosis not present

## 2022-04-15 DIAGNOSIS — R262 Difficulty in walking, not elsewhere classified: Secondary | ICD-10-CM | POA: Diagnosis not present

## 2022-04-15 DIAGNOSIS — M25562 Pain in left knee: Secondary | ICD-10-CM | POA: Diagnosis not present

## 2022-04-15 DIAGNOSIS — E785 Hyperlipidemia, unspecified: Secondary | ICD-10-CM | POA: Diagnosis not present

## 2022-04-15 DIAGNOSIS — M5459 Other low back pain: Secondary | ICD-10-CM | POA: Diagnosis not present

## 2022-04-15 DIAGNOSIS — E1169 Type 2 diabetes mellitus with other specified complication: Secondary | ICD-10-CM

## 2022-04-15 DIAGNOSIS — G8929 Other chronic pain: Secondary | ICD-10-CM | POA: Diagnosis not present

## 2022-04-15 DIAGNOSIS — M25561 Pain in right knee: Secondary | ICD-10-CM | POA: Insufficient documentation

## 2022-04-15 MED ORDER — SEMAGLUTIDE (1 MG/DOSE) 4 MG/3ML ~~LOC~~ SOPN
1.0000 mg | PEN_INJECTOR | SUBCUTANEOUS | 0 refills | Status: DC
  Filled 2022-04-15: qty 3, 28d supply, fill #0

## 2022-04-15 NOTE — Therapy (Signed)
OUTPATIENT PHYSICAL THERAPY LOWER EXTREMITY EVALUATION   Patient Name: Jane [name] MRN: [id number] DOB:February 16, 1966, 55 y.o., female Today's Date: 04/15/2022   PT End of Session - 04/15/22 0857     Visit Number 1    Number of Visits 12    Date for PT Re-Evaluation 05/27/22    Authorization Type [place] Medicaid Healthy*    Authorization Time Period Submitted for 12 visits, check auth    Authorization - Visit Number 1    Authorization - Number of Visits 1    Progress Note Due on Visit 10    PT Start Time [phone]    PT Stop Time 0856    PT Time Calculation (min) 40 min    Activity Tolerance Patient tolerated treatment well    Behavior During Therapy WFL for tasks assessed/performed             Past Medical History:  Diagnosis Date   ADD (attention deficit disorder)    Anxiety    Back pain    Bilateral swelling of feet    Bipolar 1 disorder (HCC)    Bipolar 1 disorder (HCC)    Bipolar disorder (HCC)    Chewing difficulty    Chronic fatigue syndrome    Chronic kidney disease    Stage 3 kidney disease;dx by Dr. Sinda Du.    Constipation    Depression    Diabetes mellitus without complication (HCC)    diet controlled   Dyspnea    with exertion   GERD (gastroesophageal reflux disease)    Headache    migraines   High cholesterol    History of blood clots    History of DVT (deep vein thrombosis)    left leg   Hypertension    states under control with meds., has been on med. x 2 years   Hypothyroidism    Joint pain    Neuropathy    Obsessive-compulsive disorder    Peripheral vascular disease ([place])    Prediabetes    Respiratory failure requiring intubation ([place])    Restless leg syndrome    Shortness of breath    Sleep apnea    Thrombocytopenia ([place]) 09/15/2019   Trigger thumb of left hand 01/2018   Trigger thumb of right hand    Past Surgical History:  Procedure Laterality Date   ABDOMINAL HYSTERECTOMY  06/2016   complete   APPLICATION OF WOUND VAC Left  04/27/2019   Procedure: APPLICATION OF WOUND VAC LEFT GROIN;  Surgeon: Waynetta Sandy, MD;  Location: [place];  Service: Vascular;  Laterality: Left;   AV FISTULA PLACEMENT Left 11/24/2018   Procedure: ARTERIOVENOUS (AV) FISTULA CREATION LEFT SFA TO LEFT FEMORAL VEIN;  Surgeon: Waynetta Sandy, MD;  Location: [place];  Service: Vascular;  Laterality: Left;   BACK SURGERY     CHOLECYSTECTOMY     COLONOSCOPY WITH PROPOFOL N/A 11/22/2019   Procedure: COLONOSCOPY WITH PROPOFOL;  Surgeon: [name], MD;  Location: [facility];  Service: Gastroenterology;  Laterality: N/A;   FEMORAL ARTERY EXPLORATION  03/30/2019   Procedure: Left Common Femoral Artery and Vein Exploration;  Surgeon: Waynetta Sandy, MD;  Location: [place];  Service: Vascular;;   FEMORAL-FEMORAL BYPASS GRAFT Left 11/24/2018   Procedure: BYPASS GRAFT FEMORAL-FEMORAL VENOUS LEFT TO RIGHT PALMA PROCEDURE USING CRYOVEIN;  Surgeon: Waynetta Sandy, MD;  Location: [place];  Service: Vascular;  Laterality: Left;   GROIN DEBRIDEMENT Left 04/27/2019   Procedure: [name] DEBRIDEMENT;  Surgeon: Waynetta Sandy, MD;  Location: [facility];  Service: Vascular;  Laterality: Left;   INSERTION OF ILIAC STENT  03/30/2019   Procedure: Stent of left common, external iliac veins and left common femoral vein;  Surgeon: Waynetta Sandy, MD;  Location: [place];  Service: Vascular;;   KNEE ARTHROSCOPY WITH MENISCAL REPAIR Left 11/14/2020   Procedure: LEFT KNEE ARTHROSCOPY WITH PARTIAL MEDIAL MENISCECTOMY;  Surgeon: Carole Civil, MD;  Location: [facility];  Service: Orthopedics;  Laterality: Left;   LOWER EXTREMITY VENOGRAPHY N/A 08/17/2018   Procedure: LOWER EXTREMITY VENOGRAPHY - Central Venogram;  Surgeon: Waynetta Sandy, MD;  Location: [place] [facility];  Service: Cardiovascular;  Laterality: N/A;   LOWER EXTREMITY VENOGRAPHY Bilateral 03/09/2019   Procedure: LOWER EXTREMITY VENOGRAPHY;  Surgeon: Waynetta Sandy, MD;  Location: [place] [facility];  Service: Cardiovascular;  Laterality: Bilateral;   LOWER EXTREMITY VENOGRAPHY Left 08/16/2019   Procedure: LOWER EXTREMITY VENOGRAPHY;  Surgeon: Waynetta Sandy, MD;  Location: [place] [facility];  Service: Cardiovascular;  Laterality: Left;   LOWER EXTREMITY VENOGRAPHY Left 03/25/2022   Procedure: LOWER EXTREMITY VENOGRAPHY;  Surgeon: Waynetta Sandy, MD;  Location: [place] [facility];  Service: Cardiovascular;  Laterality: Left;  IVUS   LUMBAR FUSION  11/21/2000   L5-S1   LUMBAR SPINE SURGERY     x 2 others   PATCH ANGIOPLASTY Left 03/30/2019   Procedure: Patch Angioplasty of the Left Common Femoral Vein using Venosure Biologic patch;  Surgeon: Waynetta Sandy, MD;  Location: [place];  Service: Vascular;  Laterality: Left;   PERIPHERAL VASCULAR INTERVENTION Left 08/16/2019   Procedure: PERIPHERAL VASCULAR INTERVENTION;  Surgeon: Waynetta Sandy, MD;  Location: [place] [facility];  Service: Cardiovascular;  Laterality: Left;  common femoral/femoral vein stent   PERIPHERAL VASCULAR INTERVENTION Left 03/25/2022   Procedure: PERIPHERAL VASCULAR INTERVENTION;  Surgeon: Waynetta Sandy, MD;  Location: [name] [facility];  Service: Cardiovascular;  Laterality: Left;  COMMON FEMORAL VEIN   PERIPHERAL VASCULAR THROMBECTOMY Left 03/25/2022   Procedure: PERIPHERAL VASCULAR THROMBECTOMY;  Surgeon: Waynetta Sandy, MD;  Location: [place] [facility];  Service: Cardiovascular;  Laterality: Left;  EXTREMITY/IVC   TRIGGER FINGER RELEASE Right 12/01/2017   Procedure: RELEASE TRIGGER FINGER/A-1 PULLEY RIGHT THUMB;  Surgeon: Leanora Cover, MD;  Location: [place];  Service: Orthopedics;  Laterality: Right;   TRIGGER FINGER RELEASE Left 01/26/2018   Procedure: LEFT TRIGGER THUMB RELEASE;  Surgeon: Leanora Cover, MD;  Location: [place];  Service: Orthopedics;  Laterality:  Left;   ULTRASOUND GUIDANCE FOR VASCULAR ACCESS Right 03/30/2019   Procedure: Ultrasound-guided cannulation right internal jugular vein;  Surgeon: Waynetta Sandy, MD;  Location: [facility] OR;  Service: Vascular;  Laterality: Right;   Patient Active Problem List   Diagnosis Date Noted   Chronic pain of both knees 10/22/2021   Hematuria 10/01/2021   Recurrent UTI 10/01/2021   Dyspnea on exertion 10/01/2021   Iron deficiency anemia 04/18/2021   Nocturnal sleep-related eating disorder 01/01/2021   Vitamin D deficiency 01/01/2021   Arthritis of left knee 12/26/2020   2019 novel coronavirus-infected pneumonia ([facility]) 12/03/2020   S/P left knee arthroscopy 11/14/20  11/21/2020   Old complex tear of medial meniscus of left knee    Primary osteoarthritis of left knee 11/09/2020   Subacromial bursitis of right shoulder joint 11/09/2020   Chronic pain syndrome 04/24/2020   Pharmacologic therapy 04/24/2020   Disorder of skeletal system 04/24/2020   Problems influencing health  status 04/24/2020   History of thrombocytopenia 04/24/2020   Abnormal MRI, cervical spine (01/08/2017) 04/24/2020   Abnormal MRI, lumbar spine (05/11/2014) 04/24/2020   DDD (degenerative disc disease), cervical 04/24/2020   DDD (degenerative disc disease), lumbar 04/24/2020   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   Diabetic peripheral neuropathy ([place]) 04/24/2020   Neurogenic pain 04/24/2020   Chronic musculoskeletal pain 04/24/2020   Other proteinuria 03/30/2020   Myalgia 03/15/2020   Bilateral hand swelling 02/16/2020   Thrush 01/20/2020   Morbid obesity ([place]) 10/25/2019   History of back surgery 10/25/2019   Peripheral neuropathy 10/25/2019   Restless leg syndrome 10/25/2019   Chronic low back pain (Bilateral) w/ sciatica (Bilateral) 10/25/2019   Hypertension associated with diabetes ([place]) 10/25/2019   Type 2 diabetes mellitus with stage 3 chronic kidney disease, without long-term current use of insulin ([place])  10/25/2019   Chronic anticoagulation (Coumadin) 10/25/2019   Hyperlipidemia associated with type 2 diabetes mellitus ([place]) 10/25/2019   CKD stage 3 due to type 2 diabetes mellitus ([place]) 10/25/2019   GERD (gastroesophageal reflux disease) 10/25/2019   Allergic rhinitis 10/25/2019   Chronic constipation 10/25/2019   Hypothyroidism    Normocytic anemia 09/15/2019   Iliac vein stenosis, 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   Chronic venous insufficiency 11/21/2017   Displacement of lumbar intervertebral disc without myelopathy 07/14/2017   Radicular low back pain 05/25/2014   Depression, major, recurrent ([place]) 06/17/2013   OCD (obsessive compulsive disorder) 11/20/2012   Insomnia due to mental disorder 09/25/2012   Bipolar 1 disorder ([place]) 09/25/2012   History of DVT (deep vein thrombosis) 08/18/2012    PCP: Lavon Paganini MD  REFERRING PROVIDER:  Kurtis Bushman, MD   REFERRING DIAG: PT eval/tx for M17.0 bilateral osteoarthritis of knees  THERAPY DIAG:  Chronic pain of both knees  Other low back pain  Pain in left foot  Rationale for Evaluation and Treatment Rehabilitation  ONSET DATE: Chronic   SUBJECTIVE:   SUBJECTIVE STATEMENT: Patient presents to therapy with complaint of bilateral knee, back , and LT foot pain. She reports issue is chronic. She is scheduled to have ortho consult for back. Sis suppose to get MRI and talk about "electrodes in my back". Patient currently with pain management and manages symptoms with prescribed medication. Patient has had prior therapy for knees with good result.   PERTINENT HISTORY: Lumbar surgery x 3, LT knee surgery  PAIN:  Are you having pain? Yes: NPRS scale: 9/10 Pain location: Low back, LT foot, LT knee  Pain description: sharp, constant, aching   Aggravating factors: "everything"  Relieving factors: sitting, laying down, leaning   PRECAUTIONS: None  WEIGHT BEARING RESTRICTIONS No  FALLS:  Has patient  fallen in last 6 months? No  LIVING ENVIRONMENT: Lives with: lives with their family and lives alone Lives in: House/apartment Stairs: Yes: External: 3 steps; on right going up Has following equipment at home: Single point cane  OCCUPATION: Disability  PLOF: Independent with basic ADLs  PATIENT GOALS To avoid surgery    OBJECTIVE:   DIAGNOSTIC FINDINGS: Impression grade 1/2 arthritis left knee Impression normal right knee IMPRESSION: 1. Postsurgical changes at L5-S1. 2. Progression of degenerative changes most notable at L4-L5.    PATIENT SURVEYS:  FOTO 47% function   COGNITION:  Overall cognitive status: Within functional limits for tasks assessed     Lumbar AROM:  Flexion: 60% limited Extension: 100% limited RT sidebend: 50% limited LT sidebend: 50% limited    LOWER EXTREMITY ROM: (  unable to lay prone for hip extension testing)   Active ROM Right Eval 04/15/22 Left Eval 04/15/22  Hip flexion    Hip extension    Hip abduction    Hip adduction    Hip internal rotation    Hip external rotation    Knee flexion 110 110  Knee extension -6 -7  Ankle dorsiflexion    Ankle plantarflexion    Ankle inversion    Ankle eversion     (Blank rows = not tested)  LOWER EXTREMITY MMT:  MMT Right Eval 04/15/22 Left Eval 04/15/22  Hip flexion 5 5  Hip extension Unable  Unable   Hip abduction 4+ 4+  Hip adduction    Hip internal rotation    Hip external rotation    Knee flexion    Knee extension 5 5  Ankle dorsiflexion 5 5  Ankle plantarflexion    Ankle inversion    Ankle eversion     (Blank rows = not tested)    FUNCTIONAL TESTS:  30 seconds chair stand test: 13 reps, hands on thighs   GAIT:  Decreased stride, antalgic, no AD    TODAY'S TREATMENT: 04/15/22 Bridge Ab brace    PATIENT EDUCATION:  Education details: on evaluation findings, POC and HEP Person educated: Patient Education method: Explanation Education comprehension: verbalized  understanding   HOME EXERCISE PROGRAM: Access Code: [email or web address] URL: [email or web address] Date: 04/15/2022 Prepared by: Josue Hector  Exercises - Supine Transversus Abdominis Bracing - Hands on Stomach  - 2-3 x daily - 7 x weekly - 2 sets - 10 reps - 5 second hold - Supine Bridge  - 2-3 x daily - 7 x weekly - 2 sets - 10 reps - 5 second hold  ASSESSMENT:  CLINICAL IMPRESSION: Patient is a 56 y.o. female who presents to physical therapy with complaint of Bilat knee, LBP, LT foot pain. Patient demonstrates reduced ROM, altered gait and body mechanics, and fascial restrictions which are likely contributing to symptoms of pain and are negatively impacting patient ability to perform ADLs and functional mobility tasks. Patient will benefit from skilled physical therapy services to address these deficits to reduce pain and improve level of function with ADLs and functional mobility tasks.  OBJECTIVE IMPAIRMENTS Abnormal gait, decreased activity tolerance, decreased ROM, increased fascial restrictions, impaired flexibility, improper body mechanics, and pain.   ACTIVITY LIMITATIONS lifting, bending, standing, squatting, stairs, and locomotion level  PARTICIPATION LIMITATIONS: meal prep, cleaning, laundry, shopping, community activity, and yard work  PERSONAL FACTORS 3+ comorbidities: arthritis, multiple body regions, chronicity  are also affecting patient's functional outcome.   REHAB POTENTIAL: Fair see above  CLINICAL DECISION MAKING: Stable/uncomplicated  EVALUATION COMPLEXITY: Low   GOALS: SHORT TERM GOALS: Target date: 05/06/2022  Patient will be independent with initial HEP and self-management strategies to improve functional outcomes Baseline:  Goal status: INITIAL   LONG TERM GOALS: Target date: 05/27/2022  Patient will be independent with advanced HEP and self-management strategies to improve functional outcomes Baseline:  Goal status: INITIAL  2.  Patient  will improve FOTO score to predicted value to indicate improvement in functional outcomes Baseline: 47% function Goal status: INITIAL  3.  Patient will report reduction of back and knee pain to <4/10 at rest for improved quality of life and ability to perform ADLs  Baseline: 9/10 Goal status: INITIAL  4. Patient will be able to complete at least 20 reps during 30 second sit to stand to demo improved functional strength and ability to perform ADLs.  Baseline: 13 reps Goal status: INITIAL  PLAN: PT FREQUENCY: 2x/week  PT DURATION: 6 weeks  PLANNED INTERVENTIONS: Therapeutic exercises, Therapeutic activity, Neuromuscular re-education, Balance training, Gait training, Patient/Family education, Joint manipulation, Joint mobilization, Stair training, Aquatic Therapy, Dry Needling, Electrical stimulation, Spinal manipulation, Spinal mobilization, Cryotherapy, Moist heat, scar mobilization, Taping, Traction, Ultrasound, Biofeedback, Ionotophoresis '4mg'$ /ml Dexamethasone, and Manual therapy.   PLAN FOR NEXT SESSION: Progress core, glute and quad strengthening as tolerated. Knee and lumbar mobility progression as able.   8:58 AM, 04/15/22 Josue Hector PT DPT  Physical Therapist with [facility]  [phone]

## 2022-04-17 ENCOUNTER — Encounter (HOSPITAL_COMMUNITY): Payer: Self-pay

## 2022-04-17 ENCOUNTER — Ambulatory Visit (HOSPITAL_COMMUNITY): Payer: Medicaid Other | Attending: Orthopedic Surgery

## 2022-04-17 DIAGNOSIS — R262 Difficulty in walking, not elsewhere classified: Secondary | ICD-10-CM | POA: Insufficient documentation

## 2022-04-17 DIAGNOSIS — M79672 Pain in left foot: Secondary | ICD-10-CM | POA: Insufficient documentation

## 2022-04-17 DIAGNOSIS — M170 Bilateral primary osteoarthritis of knee: Secondary | ICD-10-CM | POA: Insufficient documentation

## 2022-04-17 DIAGNOSIS — M5416 Radiculopathy, lumbar region: Secondary | ICD-10-CM | POA: Diagnosis not present

## 2022-04-17 DIAGNOSIS — G894 Chronic pain syndrome: Secondary | ICD-10-CM | POA: Insufficient documentation

## 2022-04-17 DIAGNOSIS — G8929 Other chronic pain: Secondary | ICD-10-CM

## 2022-04-17 DIAGNOSIS — M5459 Other low back pain: Secondary | ICD-10-CM | POA: Insufficient documentation

## 2022-04-17 DIAGNOSIS — M179 Osteoarthritis of knee, unspecified: Secondary | ICD-10-CM | POA: Diagnosis present

## 2022-04-17 DIAGNOSIS — G629 Polyneuropathy, unspecified: Secondary | ICD-10-CM | POA: Diagnosis not present

## 2022-04-17 NOTE — Therapy (Signed)
OUTPATIENT PHYSICAL THERAPY LOWER EXTREMITY EVALUATION   Patient Name: Kalynne Womac MRN: [id number] DOB:1966-08-09, 56 y.o., female Today's Date: 04/17/2022   PT End of Session - 04/17/22 1129     Visit Number 2    Number of Visits 12    Date for PT Re-Evaluation 05/27/22    Authorization Type [place] Medicaid Healthy*    Authorization Time Period approved 7 visits from 6/12-->06/13/22    Authorization - Visit Number 1    Authorization - Number of Visits 7    Progress Note Due on Visit 7    PT Start Time 1135    PT Stop Time 1218    PT Time Calculation (min) 43 min    Activity Tolerance Patient tolerated treatment well    Behavior During Therapy WFL for tasks assessed/performed             Past Medical History:  Diagnosis Date   ADD (attention deficit disorder)    Anxiety    Back pain    Bilateral swelling of feet    Bipolar 1 disorder (HCC)    Bipolar 1 disorder (HCC)    Bipolar disorder (HCC)    Chewing difficulty    Chronic fatigue syndrome    Chronic kidney disease    Stage 3 kidney disease;dx by Dr. Sinda Du.    Constipation    Depression    Diabetes mellitus without complication (HCC)    diet controlled   Dyspnea    with exertion   GERD (gastroesophageal reflux disease)    Headache    migraines   High cholesterol    History of blood clots    History of DVT (deep vein thrombosis)    left leg   Hypertension    states under control with meds., has been on med. x 2 years   Hypothyroidism    Joint pain    Neuropathy    Obsessive-compulsive disorder    Peripheral vascular disease ([place])    Prediabetes    Respiratory failure requiring intubation ([place])    Restless leg syndrome    Shortness of breath    Sleep apnea    Thrombocytopenia ([place]) 09/15/2019   Trigger thumb of left hand 01/2018   Trigger thumb of right hand    Past Surgical History:  Procedure Laterality Date   ABDOMINAL HYSTERECTOMY  06/2016   complete   APPLICATION OF WOUND VAC  Left 04/27/2019   Procedure: APPLICATION OF WOUND VAC LEFT GROIN;  Surgeon: Waynetta Sandy, MD;  Location: [place];  Service: Vascular;  Laterality: Left;   AV FISTULA PLACEMENT Left 11/24/2018   Procedure: ARTERIOVENOUS (AV) FISTULA CREATION LEFT SFA TO LEFT FEMORAL VEIN;  Surgeon: Waynetta Sandy, MD;  Location: [place];  Service: Vascular;  Laterality: Left;   BACK SURGERY     CHOLECYSTECTOMY     COLONOSCOPY WITH PROPOFOL N/A 11/22/2019   Procedure: COLONOSCOPY WITH PROPOFOL;  Surgeon: [name], MD;  Location: [facility] ENDOSCOPY;  Service: Gastroenterology;  Laterality: N/A;   FEMORAL ARTERY EXPLORATION  03/30/2019   Procedure: Left Common Femoral Artery and Vein Exploration;  Surgeon: Waynetta Sandy, MD;  Location: [place];  Service: Vascular;;   FEMORAL-FEMORAL BYPASS GRAFT Left 11/24/2018   Procedure: BYPASS GRAFT FEMORAL-FEMORAL VENOUS LEFT TO RIGHT PALMA PROCEDURE USING CRYOVEIN;  Surgeon: Waynetta Sandy, MD;  Location: [place];  Service: Vascular;  Laterality: Left;   GROIN DEBRIDEMENT Left 04/27/2019   Procedure: [name] DEBRIDEMENT;  Surgeon: Waynetta Sandy, MD;  Location: [facility];  Service: Vascular;  Laterality: Left;   INSERTION OF ILIAC STENT  03/30/2019   Procedure: Stent of left common, external iliac veins and left common femoral vein;  Surgeon: Waynetta Sandy, MD;  Location: [place];  Service: Vascular;;   KNEE ARTHROSCOPY WITH MENISCAL REPAIR Left 11/14/2020   Procedure: LEFT KNEE ARTHROSCOPY WITH PARTIAL MEDIAL MENISCECTOMY;  Surgeon: Carole Civil, MD;  Location: [facility];  Service: Orthopedics;  Laterality: Left;   LOWER EXTREMITY VENOGRAPHY N/A 08/17/2018   Procedure: LOWER EXTREMITY VENOGRAPHY - Central Venogram;  Surgeon: Waynetta Sandy, MD;  Location: [place] [facility];  Service: Cardiovascular;  Laterality: N/A;   LOWER EXTREMITY VENOGRAPHY Bilateral 03/09/2019   Procedure: LOWER EXTREMITY VENOGRAPHY;  Surgeon:  Waynetta Sandy, MD;  Location: [place] [facility];  Service: Cardiovascular;  Laterality: Bilateral;   LOWER EXTREMITY VENOGRAPHY Left 08/16/2019   Procedure: LOWER EXTREMITY VENOGRAPHY;  Surgeon: Waynetta Sandy, MD;  Location: [place] [facility];  Service: Cardiovascular;  Laterality: Left;   LOWER EXTREMITY VENOGRAPHY Left 03/25/2022   Procedure: LOWER EXTREMITY VENOGRAPHY;  Surgeon: Waynetta Sandy, MD;  Location: [place] [facility];  Service: Cardiovascular;  Laterality: Left;  IVUS   LUMBAR FUSION  11/21/2000   L5-S1   LUMBAR SPINE SURGERY     x 2 others   PATCH ANGIOPLASTY Left 03/30/2019   Procedure: Patch Angioplasty of the Left Common Femoral Vein using Venosure Biologic patch;  Surgeon: Waynetta Sandy, MD;  Location: [place];  Service: Vascular;  Laterality: Left;   PERIPHERAL VASCULAR INTERVENTION Left 08/16/2019   Procedure: PERIPHERAL VASCULAR INTERVENTION;  Surgeon: Waynetta Sandy, MD;  Location: [place] CV LAB;  Service: Cardiovascular;  Laterality: Left;  common femoral/femoral vein stent   PERIPHERAL VASCULAR INTERVENTION Left 03/25/2022   Procedure: PERIPHERAL VASCULAR INTERVENTION;  Surgeon: Waynetta Sandy, MD;  Location: [place] [facility];  Service: Cardiovascular;  Laterality: Left;  COMMON FEMORAL VEIN   PERIPHERAL VASCULAR THROMBECTOMY Left 03/25/2022   Procedure: PERIPHERAL VASCULAR THROMBECTOMY;  Surgeon: Waynetta Sandy, MD;  Location: [place] [facility];  Service: Cardiovascular;  Laterality: Left;  EXTREMITY/IVC   TRIGGER FINGER RELEASE Right 12/01/2017   Procedure: RELEASE TRIGGER FINGER/A-1 PULLEY RIGHT THUMB;  Surgeon: Leanora Cover, MD;  Location: [place];  Service: Orthopedics;  Laterality: Right;   TRIGGER FINGER RELEASE Left 01/26/2018   Procedure: LEFT TRIGGER THUMB RELEASE;  Surgeon: Leanora Cover, MD;  Location: [place];  Service: Orthopedics;   Laterality: Left;   ULTRASOUND GUIDANCE FOR VASCULAR ACCESS Right 03/30/2019   Procedure: Ultrasound-guided cannulation right internal jugular vein;  Surgeon: Waynetta Sandy, MD;  Location: [facility] OR;  Service: Vascular;  Laterality: Right;   Patient Active Problem List   Diagnosis Date Noted   Chronic pain of both knees 10/22/2021   Hematuria 10/01/2021   Recurrent UTI 10/01/2021   Dyspnea on exertion 10/01/2021   Iron deficiency anemia 04/18/2021   Nocturnal sleep-related eating disorder 01/01/2021   Vitamin D deficiency 01/01/2021   Arthritis of left knee 12/26/2020   2019 novel coronavirus-infected pneumonia ([facility]) 12/03/2020   S/P left knee arthroscopy 11/14/20  11/21/2020   Old complex tear of medial meniscus of left knee    Primary osteoarthritis of left knee 11/09/2020   Subacromial bursitis of right shoulder joint 11/09/2020   Chronic pain syndrome 04/24/2020   Pharmacologic therapy 04/24/2020   Disorder of skeletal system 04/24/2020   Problems influencing health  status 04/24/2020   History of thrombocytopenia 04/24/2020   Abnormal MRI, cervical spine (01/08/2017) 04/24/2020   Abnormal MRI, lumbar spine (05/11/2014) 04/24/2020   DDD (degenerative disc disease), cervical 04/24/2020   DDD (degenerative disc disease), lumbar 04/24/2020   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   Diabetic peripheral neuropathy ([place]) 04/24/2020   Neurogenic pain 04/24/2020   Chronic musculoskeletal pain 04/24/2020   Other proteinuria 03/30/2020   Myalgia 03/15/2020   Bilateral hand swelling 02/16/2020   Thrush 01/20/2020   Morbid obesity ([place]) 10/25/2019   History of back surgery 10/25/2019   Peripheral neuropathy 10/25/2019   Restless leg syndrome 10/25/2019   Chronic low back pain (Bilateral) w/ sciatica (Bilateral) 10/25/2019   Hypertension associated with diabetes ([place]) 10/25/2019   Type 2 diabetes mellitus with stage 3 chronic kidney disease, without long-term current use of  insulin ([place]) 10/25/2019   Chronic anticoagulation (Coumadin) 10/25/2019   Hyperlipidemia associated with type 2 diabetes mellitus ([place]) 10/25/2019   CKD stage 3 due to type 2 diabetes mellitus ([place]) 10/25/2019   GERD (gastroesophageal reflux disease) 10/25/2019   Allergic rhinitis 10/25/2019   Chronic constipation 10/25/2019   Hypothyroidism    Normocytic anemia 09/15/2019   Iliac vein stenosis, 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   Chronic venous insufficiency 11/21/2017   Displacement of lumbar intervertebral disc without myelopathy 07/14/2017   Radicular low back pain 05/25/2014   Depression, major, recurrent ([place]) 06/17/2013   OCD (obsessive compulsive disorder) 11/20/2012   Insomnia due to mental disorder 09/25/2012   Bipolar 1 disorder ([place]) 09/25/2012   History of DVT (deep vein thrombosis) 08/18/2012    PCP: Lavon Paganini MD  REFERRING PROVIDER:  Kurtis Bushman, MD    REFERRING DIAG: PT eval/tx for M17.0 bilateral osteoarthritis of knees  THERAPY DIAG:  Chronic pain of both knees  Other low back pain  Pain in left foot  Difficulty in walking, not elsewhere classified  Rationale for Evaluation and Treatment Rehabilitation  ONSET DATE: Chronic   SUBJECTIVE:   SUBJECTIVE STATEMENT: Patient presents to therapy with complaint of bilateral knee, back , and LT foot pain. She reports issue is chronic. She is scheduled to have ortho consult for back. Sis suppose to get MRI and talk about "electrodes in my back". Patient currently with pain management and manages symptoms with prescribed medication. Patient has had prior therapy for knees with good result.   Pt stated she had a rough night last night, slept for 5 hours last night. C/o "charlie horses" Lt>Rt in groin area.  Had 2 stents put in May 20th has been burping up taste like rotten eggs.  Pain scale 8/10 mid back, going down Lt LE to feet and knee pain as well.    PERTINENT HISTORY: Lumbar  surgery x 3, LT knee surgery, hx of blood clot  PAIN:  Are you having pain? Yes: NPRS scale: 8/10 Pain location: Low back, LT foot, LT knee  Pain description: sharp, constant, aching   Aggravating factors: "everything"  Relieving factors: sitting, laying down, leaning   PRECAUTIONS: None  WEIGHT BEARING RESTRICTIONS No  FALLS:  Has patient fallen in last 6 months? No  LIVING ENVIRONMENT: Lives with: lives with their family and lives alone Lives in: House/apartment Stairs: Yes: External: 3 steps; on right going up Has following equipment at home: Single point cane  OCCUPATION: Disability  PLOF: Independent with basic ADLs  PATIENT GOALS To avoid surgery    OBJECTIVE:   DIAGNOSTIC FINDINGS: Impression grade 1/2 arthritis  left knee Impression normal right knee IMPRESSION: 1. Postsurgical changes at L5-S1. 2. Progression of degenerative changes most notable at L4-L5.    PATIENT SURVEYS:  FOTO 47% function   COGNITION:  Overall cognitive status: Within functional limits for tasks assessed     Lumbar AROM:  Flexion: 60% limited Extension: 100% limited RT sidebend: 50% limited LT sidebend: 50% limited    LOWER EXTREMITY ROM: (unable to lay prone for hip extension testing)   Active ROM Right Eval 04/15/22 Left Eval 04/15/22  Hip flexion    Hip extension    Hip abduction    Hip adduction    Hip internal rotation    Hip external rotation    Knee flexion 110 110  Knee extension -6 -7  Ankle dorsiflexion    Ankle plantarflexion    Ankle inversion    Ankle eversion     (Blank rows = not tested)  LOWER EXTREMITY MMT:  MMT Right Eval 04/15/22 Left Eval 04/15/22  Hip flexion 5 5  Hip extension Unable  Unable   Hip abduction 4+ 4+  Hip adduction    Hip internal rotation    Hip external rotation    Knee flexion    Knee extension 5 5  Ankle dorsiflexion 5 5  Ankle plantarflexion    Ankle inversion    Ankle eversion     (Blank rows = not  tested)    FUNCTIONAL TESTS:  30 seconds chair stand test: 13 reps, hands on thighs   GAIT:  Decreased stride, antalgic, no AD    TODAY'S TREATMENT: 04/17/22: Supine: Diaphragmatic breathing x 1 min Ab bracing with exhale 3-5" holds x 2 min Bent knee raise with ab set 10x 5" Clam with RTB 10x 5" for stability Bridge 10x 5" Quad sets 10x 5" BLE  Seated: seated posture  Wback 10x   04/15/22 Bridge Ab brace    PATIENT EDUCATION:  Education details: 04/17/22:  Reviewed goals, educated importance of HEP compliance with HEP compliance for maximal benefits, educated benefits with compression garments for edema control and hx of blood clot, educated benefits with posture for pain control. EvaL: on evaluation findings, POC and HEP Person educated: Patient Education method: Explanation Education comprehension: verbalized understanding   HOME EXERCISE PROGRAM: Access Code: [email or web address] URL: [email or web address] Date: 04/15/2022 Prepared by: Josue Hector  Exercises - Supine Transversus Abdominis Bracing - Hands on Stomach  - 2-3 x daily - 7 x weekly - 2 sets - 10 reps - 5 second hold - Supine Bridge  - 2-3 x daily - 7 x weekly - 2 sets - 10 reps - 5 second hold  04/17/22: Quad sets, Bent knee raise, clam with RTB  ASSESSMENT:  CLINICAL IMPRESSION: Reviewed goals, educated importance of HEP compliance for maximal benefits, able to recall and demonstrate exercises with cueing to breathing with transverse abdominis exercise.  Pt educated on importance of seated posture to reduce stress on lower back, pt c/o increased pressure when sitting tall.  Noted edema present Lt LE and pt reports hx of blood clots, educated benefits with compression garments, pt stated she has worn them in the past but not a fan.  Advanced HEP with additional quad set for knee mobility and core stability exercises that pt able to complete with good mechanics.  EOS pt reports pain  reduced.  OBJECTIVE IMPAIRMENTS Abnormal gait, decreased activity tolerance, decreased ROM, increased fascial restrictions, impaired flexibility, improper body mechanics, and pain.   ACTIVITY LIMITATIONS lifting, bending, standing, squatting, stairs,  and locomotion level  PARTICIPATION LIMITATIONS: meal prep, cleaning, laundry, shopping, community activity, and yard work  PERSONAL FACTORS 3+ comorbidities: arthritis, multiple body regions, chronicity  are also affecting patient's functional outcome.   REHAB POTENTIAL: Fair see above  CLINICAL DECISION MAKING: Stable/uncomplicated  EVALUATION COMPLEXITY: Low   GOALS: SHORT TERM GOALS: Target date: 05/06/2022  Patient will be independent with initial HEP and self-management strategies to improve functional outcomes Baseline:  Goal status: Ongoing  LONG TERM GOALS: Target date: 05/27/2022  Patient will be independent with advanced HEP and self-management strategies to improve functional outcomes Baseline:  Goal status: Ongoing  2.  Patient will improve FOTO score to predicted value to indicate improvement in functional outcomes Baseline: 47% function Goal status: Ongoing  3.  Patient will report reduction of back and knee pain to <4/10 at rest for improved quality of life and ability to perform ADLs  Baseline: 9/10 Goal status: Ongoing  4. Patient will be able to complete at least 20 reps during 30 second sit to stand to demo improved functional strength and ability to perform ADLs.  Baseline: 13 reps Goal status: Ongoing  PLAN: PT FREQUENCY: 2x/week  PT DURATION: 6 weeks  PLANNED INTERVENTIONS: Therapeutic exercises, Therapeutic activity, Neuromuscular re-education, Balance training, Gait training, Patient/Family education, Joint manipulation, Joint mobilization, Stair training, Aquatic Therapy, Dry Needling, Electrical stimulation, Spinal manipulation, Spinal mobilization, Cryotherapy, Moist heat, scar mobilization, Taping,  Traction, Ultrasound, Biofeedback, Ionotophoresis '4mg'$ /ml Dexamethasone, and Manual therapy.   PLAN FOR NEXT SESSION: Progress core, glute and quad strengthening as tolerated. Knee and lumbar mobility progression as able.   Ihor Austin, LPTA/CLT; Delana Meyer [phone]  5:16 PM, 04/17/22

## 2022-04-17 NOTE — Progress Notes (Unsigned)
Chief Complaint:   OBESITY Jane [name] is here to discuss her progress with her obesity treatment plan along with follow-up of her obesity related diagnoses. Jane [name] is on the Category 4 Plan and states she is following her eating plan approximately 85% of the time. Jane [name] states she is walking 30 minutes 7 times per week.  Today's visit was #: 28 Starting weight: 283 lbs Starting date: 03/29/2020 Today's weight: 247 lbs Today's date: 04/15/2022 Total lbs lost to date: 36 lbs Total lbs lost since last in-office visit: 5  Interim History: Jane [name] has really focused on herself over the last few weeks. She had 2 new stents put in on 5/22 to replace clogged ones. Still having significant leg pain, considering nerve stimulator placement. She is getting more movement in. She may be going to GED testing 6/27 or 6/28.  Subjective:   1. Hyperlipidemia associated with type 2 diabetes mellitus ([place]) Jane [name] is currently taking Lipitor 40 mg. She does ha myalgias but have significant back pain and neuropathy.  2. Type 2 diabetes mellitus with hyperglycemia, without long-term current use of insulin (HCC) Jane [name] is currently on Ozempic 1 mg. Without GI side effects. Her last [name] at 6.2.  Assessment/Plan:   1. Hyperlipidemia associated with type 2 diabetes mellitus ([place]) Jane [name] will continue taking Lipitor without changes in dose. Follow up with PCP.  2. Type 2 diabetes mellitus with hyperglycemia, without long-term current use of insulin (HCC) We will refill Ozempic 1 mg SubQ once weekly for 1 month with 0 refills. Obtain labs per PCP.  -Refill Semaglutide, 1 MG/DOSE, 4 MG/3ML SOPN; Inject 1 mg as directed once a week.  Dispense: 3 mL; Refill: 0  3. Obesity with current BMI of 35.6 Jane [name] is currently in the action stage of change. As such, her goal is to continue with weight loss efforts. She has agreed to the Category 4 Plan.   Exercise goals: As is.  Behavioral modification strategies: increasing  lean protein intake, meal planning and cooking strategies, keeping healthy foods in the home, and planning for success.  Gabriel has agreed to follow-up with our clinic in 4 weeks. She was informed of the importance of frequent follow-up visits to maximize her success with intensive lifestyle modifications for her multiple health conditions.   Objective:   Blood pressure 114/65, pulse 65, temperature 98.1 F (36.7 C), height '5\' 10"'$  (1.778 m), weight 247 lb (112 kg), SpO2 97 %. Body mass index is 35.44 kg/m.  General: Cooperative, alert, well developed, in no acute distress. HEENT: Conjunctivae and lids unremarkable. Cardiovascular: Regular rhythm.  Lungs: Normal work of breathing. Neurologic: No focal deficits.   Lab Results  Component Value Date   CREATININE 1.60 (H) 03/25/2022   BUN 31 (H) 03/25/2022   NA 141 03/25/2022   K 4.1 03/25/2022   CL 100 03/25/2022   CO2 32 04/12/2021   Lab Results  Component Value Date   ALT 20 04/12/2021   AST 17 04/12/2021   ALKPHOS 95 04/12/2021   BILITOT 0.6 04/12/2021   Lab Results  Component Value Date   HGBA1C 6.2 (H) 11/06/2021   HGBA1C 6.3 (H) 03/22/2021   HGBA1C 5.8 (A) 10/24/2020   HGBA1C 6.2 (H) 03/29/2020   HGBA1C 6.3 (H) 10/25/2019   Lab Results  Component Value Date   INSULIN 21.5 03/29/2020   Lab Results  Component Value Date   TSH 0.107 (L) 05/18/2020   Lab Results  Component Value Date   CHOL 191 11/06/2021  HDL 38 (L) 11/06/2021   LDLCALC 116 (H) 11/06/2021   TRIG 187 (H) 11/06/2021   CHOLHDL 5.0 11/06/2021   Lab Results  Component Value Date   VD25OH 47.10 02/21/2022   VD25OH 50.60 01/01/2021   VD25OH 45.30 10/17/2020   Lab Results  Component Value Date   WBC 5.2 02/21/2022   HGB 13.3 03/25/2022   HCT 39.0 03/25/2022   MCV 92.1 02/21/2022   PLT 152 02/21/2022   Lab Results  Component Value Date   IRON 87 02/21/2022   TIBC 300 02/21/2022   FERRITIN 133 02/21/2022   Attestation Statements:    Reviewed by clinician on day of visit: allergies, medications, problem list, medical history, surgical history, family history, social history, and previous encounter notes.  I, Elnora Morrison, RMA am acting as transcriptionist for Coralie Common, MD.  I have reviewed the above documentation for accuracy and completeness, and I agree with the above. -  ***

## 2022-04-18 ENCOUNTER — Encounter: Payer: Self-pay | Admitting: Vascular Surgery

## 2022-04-19 ENCOUNTER — Other Ambulatory Visit (HOSPITAL_COMMUNITY): Payer: Self-pay | Admitting: Neurosurgery

## 2022-04-19 ENCOUNTER — Other Ambulatory Visit: Payer: Self-pay | Admitting: Neurosurgery

## 2022-04-19 DIAGNOSIS — G894 Chronic pain syndrome: Secondary | ICD-10-CM

## 2022-04-19 DIAGNOSIS — G629 Polyneuropathy, unspecified: Secondary | ICD-10-CM

## 2022-04-19 DIAGNOSIS — M5416 Radiculopathy, lumbar region: Secondary | ICD-10-CM

## 2022-04-19 NOTE — Progress Notes (Unsigned)
I,Taryne Kiger E Fartun Paradiso,acting as a scribe for Lavon Paganini, MD.,have documented [name] relevant documentation on the behalf of Lavon Paganini, MD,as directed by  Lavon Paganini, MD while in the presence of Lavon Paganini, MD.   Established patient visit   Patient: Jane [name]   DOB: 20-Nov-1965   56 y.o. Female  MRN: [id number] Visit Date: 04/22/2022  Today's healthcare provider: Lavon Paganini, MD   Chief Complaint  Patient presents with   follow-up chronic disease   Subjective    HPI  Hypertension, follow-up  BP Readings from Last 3 Encounters:  04/22/22 109/71  04/15/22 114/65  03/26/22 (!) 116/58   Wt Readings from Last 3 Encounters:  04/22/22 250 lb 1.6 oz (113.4 kg)  04/15/22 247 lb (112 kg)  03/26/22 250 lb (113.4 kg)     She was last seen for hypertension 6 months ago.  BP at that visit was 131/81. Management since that visit includes Continue current medications.  She reports excellent compliance with treatment. She is not having side effects.  She is following a  high protein  diet. [facility] [place] She is exercising. Water aerobics, walking and physical therapy. She does not smoke.  Outside blood pressures are 110's/70. Symptoms: No chest pain No chest pressure  No palpitations No syncope  No dyspnea No orthopnea  No paroxysmal nocturnal dyspnea Yes lower extremity edema   Pertinent labs Lab Results  Component Value Date   CHOL 191 11/06/2021   HDL 38 (L) 11/06/2021   LDLCALC 116 (H) 11/06/2021   TRIG 187 (H) 11/06/2021   CHOLHDL 5.0 11/06/2021   Lab Results  Component Value Date   NA 141 03/25/2022   K 4.1 03/25/2022   CREATININE 1.60 (H) 03/25/2022   GFRNONAA 52 (L) 04/12/2021   GLUCOSE 96 03/25/2022   TSH 0.107 (L) 05/18/2020     The 10-year ASCVD risk score (Arnett DK, et al., 2019) is: 5%  ---------------------------------------------------------------------------------------------------   Diabetes Mellitus Type II, Follow-up  Lab Results  Component Value Date   HGBA1C 5.8 (A) 04/22/2022   HGBA1C 6.2 (H) 11/06/2021   HGBA1C 6.3 (H) 03/22/2021   Wt Readings from Last 3 Encounters:  04/22/22 250 lb 1.6 oz (113.4 kg)  04/15/22 247 lb (112 kg)  03/26/22 250 lb (113.4 kg)   Last seen for diabetes 6 months ago.  Management since then includes Continue ozempic. She reports excellent compliance with treatment. She is not having side effects.  Symptoms: No fatigue No foot ulcerations  No appetite changes No nausea  Yes paresthesia of the feet -Left foot No polydipsia  No polyuria No visual disturbances   No vomiting     Home blood sugar records:  not being checked  Episodes of hypoglycemia? No    Current insulin regiment: none Most Recent Eye Exam: Last Year-is Due- reports that she has it done at [facility]. They don't do diabetic eye exam.  Pertinent Labs: Lab Results  Component Value Date   CHOL 191 11/06/2021   HDL 38 (L) 11/06/2021   LDLCALC 116 (H) 11/06/2021   TRIG 187 (H) 11/06/2021   CHOLHDL 5.0 11/06/2021   Lab Results  Component Value Date   NA 141 03/25/2022   K 4.1 03/25/2022   CREATININE 1.60 (H) 03/25/2022   GFRNONAA 52 (L) 04/12/2021   MICROALBUR 236.7 (H) 11/06/2021   LABMICR See below: 01/16/2022     ---------------------------------------------------------------------------------------------------  Lipid/Cholesterol, Follow-up  Last lipid panel  Other pertinent labs  Lab Results  Component Value Date   CHOL 191 11/06/2021   HDL 38 (L) 11/06/2021   LDLCALC 116 (H) 11/06/2021   TRIG 187 (H) 11/06/2021   CHOLHDL 5.0 11/06/2021   Lab Results  Component Value Date   ALT 20 04/12/2021   AST 17 04/12/2021   PLT 152 02/21/2022   TSH 0.107 (L) 05/18/2020     She was last seen for this 6 months ago.  Management since that visit includes continue Statin.  She reports excellent compliance with treatment. She  is not having side effects.   Symptoms: No chest pain No chest pressure/discomfort  No dyspnea Yes lower extremity edema  No numbness or tingling of extremity No orthopnea  No palpitations No paroxysmal nocturnal dyspnea  No speech difficulty No syncope    The 10-year ASCVD risk score (Arnett DK, et al., 2019) is: 5%  ---------------------------------------------------------------------------------------------------  Medications: Outpatient Medications Prior to Visit  Medication Sig   ALPRAZolam (XANAX) 1 MG tablet Take 1 tablet (1 mg total) by mouth 2 (two) times daily as needed. for anxiety   amitriptyline (ELAVIL) 50 MG tablet Take 1 tablet (50 mg total) by mouth at bedtime. Dosage increase   aspirin EC 81 MG tablet Take 81 mg by mouth daily.   aspirin-acetaminophen-caffeine (EXCEDRIN MIGRAINE) 250-250-65 MG tablet Take 2 tablets by mouth every 6 (six) hours as needed for headache.   atorvastatin (LIPITOR) 40 MG tablet Take 1 tablet (40 mg total) by mouth daily.   cetirizine (ZYRTEC) 10 MG tablet TAKE 1 TABLET BY MOUTH DAILY   colchicine 0.6 MG tablet Take 1.'2mg'$  (2 tablets) then 0.'6mg'$  (1 tablet) 1 hour after. Then, take 1 tablet every day for 7 days.   FLUoxetine (PROZAC) 20 MG capsule Take 1 capsule (20 mg total) by mouth daily. Take with 40 mg to equal 60 mg daily   FLUoxetine (PROZAC) 40 MG capsule Take 1 capsule (40 mg total) by mouth daily. Take with 20 mg to equal 60 mg daily   GARLIC PO Take 1 tablet by mouth daily.   hydrochlorothiazide (HYDRODIURIL) 25 MG tablet TAKE 1 TABLET BY MOUTH DAILY Please schedule office visit BEFORE any future refills   HYDROcodone-acetaminophen (NORCO) 10-325 MG tablet Take 1 tablet by mouth every 6 (six) hours as needed. To last 30 days from fill date   [START ON 04/30/2022] HYDROcodone-acetaminophen (NORCO) 10-325 MG tablet Take 1 tablet by mouth every 6 (six) hours as needed. To last 30 days from fill date   [START ON 05/30/2022]  HYDROcodone-acetaminophen (NORCO) 10-325 MG tablet Take 1 tablet by mouth every 6 (six) hours as needed. To last 30 days from fill date   levothyroxine (SYNTHROID) 125 MCG tablet Take 125 mcg by mouth daily before breakfast.   LINZESS 145 MCG CAPS capsule TAKE ONE CAPSULE BY MOUTH DAILY   methocarbamol (ROBAXIN) 750 MG tablet Take 1 tablet (750 mg total) by mouth 2 (two) times daily as needed for muscle spasms.   Multiple Vitamin (MULTIVITAMIN) capsule Take 1 capsule by mouth daily.   Omega-3 Fatty Acids (OMEGA-3 FISH OIL PO) Take 1 capsule by mouth daily.   pantoprazole (PROTONIX) 40 MG tablet TAKE 1 TABLET BY MOUTH DAILY   potassium chloride (KLOR-CON M) 10 MEQ tablet TAKE 1 TABLET BY MOUTH THREE TIMES DAILY   pregabalin (LYRICA) 100 MG capsule Take 1 capsule (100 mg total) by mouth 4 (four) times daily.   rOPINIRole (REQUIP) 2 MG tablet TAKE 1 TABLET BY  MOUTH TWICE DAILY   Semaglutide, 1 MG/DOSE, 4 MG/3ML SOPN Inject 1 mg as directed once a week.   Vibegron (GEMTESA) 75 MG TABS Take 75 mg by mouth daily.   warfarin (COUMADIN) 5 MG tablet TAKE 1 AND 1/2 TABLETS BY MOUTH ON TUEDAYS AND THURSDAYS, THEN TAKE 1 TABLET DAILY ON [name] OTHER DAYS (Patient taking differently: Take 2.5-5 mg by mouth See admin instructions. Take 5 mg daily except on Wednesdays take 2.5 mg dose)   [DISCONTINUED] lisinopril (ZESTRIL) 5 MG tablet Take 1 tablet (5 mg total) by mouth daily.   No facility-administered medications prior to visit.    Review of Systems per HPI     Objective    BP 109/71 (BP Location: Left Arm, Patient Position: Sitting, Cuff Size: Large)   Pulse 69   Temp 98.5 F (36.9 C) (Oral)   Resp 16   Wt 250 lb 1.6 oz (113.4 kg)   LMP  (LMP Unknown)   BMI 35.89 kg/m    Physical Exam Vitals reviewed.  Constitutional:      General: She is not in acute distress.    Appearance: Normal appearance. She is well-developed. She is not diaphoretic.  HENT:     Head: Normocephalic and atraumatic.   Eyes:     General: No scleral icterus.    Conjunctiva/sclera: Conjunctivae normal.  Neck:     Thyroid: No thyromegaly.  Cardiovascular:     Rate and Rhythm: Normal rate and regular rhythm.     Pulses: Normal pulses.     Heart sounds: Normal heart sounds. No murmur heard. Pulmonary:     Effort: Pulmonary effort is normal. No respiratory distress.     Breath sounds: Normal breath sounds. No wheezing, rhonchi or rales.  Musculoskeletal:     Cervical back: Neck supple.     Right lower leg: No edema.     Left lower leg: Edema present.  Lymphadenopathy:     Cervical: No cervical adenopathy.  Skin:    General: Skin is warm and dry.     Findings: No rash.  Neurological:     Mental Status: She is alert and oriented to person, place, and time. Mental status is at baseline.  Psychiatric:        Mood and Affect: Mood normal.        Behavior: Behavior normal.       Results for orders placed or performed in visit on 04/22/22  POCT glycosylated hemoglobin (Hb [name])  Result Value Ref Range   Hemoglobin [name] 5.8 (A) 4.0 - 5.6 %   Est. 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)    Chronic and stable Recheck metabolic panel Avoid nephrotoxic meds       Relevant Medications   lisinopril (ZESTRIL) 5 MG tablet   Other Relevant Orders   Comprehensive metabolic panel   Hypothyroidism    Followed by endocrinology Previously well controlled Continue Synthroid at current dose  Recheck TSH as she has  lost weight recently and adjust Synthroid as indicated         Relevant Orders   TSH   Type 2 diabetes mellitus with stage 3 chronic kidney disease, without long-term current use of insulin ([place]) - Primary    Well-controlled with [name] 5.8 Up-to-date on screenings, but needs eye exam, so referred to ophthalmology Continue Ozempic On statin Resume lisinopril low-dose Follow-up in 6 months      Relevant Medications   lisinopril (ZESTRIL) 5 MG tablet   Other Relevant Orders   POCT glycosylated hemoglobin (Hb [name]) (Completed)   Ambulatory referral to Ophthalmology   Hyperlipidemia associated with type 2 diabetes mellitus ([place])    Previously well controlled Continue statin Recheck FLP and CMP      Relevant Medications   lisinopril (ZESTRIL) 5 MG tablet   Other Relevant Orders   Lipid panel   Comprehensive metabolic panel     Other   Morbid obesity ([place])    BMI greater than 35 and associated with HTN and DM Followed by healthy weight and wellness Congratulated on weight loss Discussed importance of healthy weight management Discussed diet and exercise       Other Visit Diagnoses     Screening exam for skin cancer       Relevant Orders   Ambulatory referral to Dermatology        Return in about 6 months (around 10/22/2022) for CPE.      I, Lavon Paganini, MD, have reviewed [name] documentation for this visit. The documentation on 04/22/22 for the exam, diagnosis, procedures, and orders are [name] accurate and complete.   Bacigalupo, Dionne Bucy, MD, MPH [facility]

## 2022-04-22 ENCOUNTER — Encounter: Payer: Self-pay | Admitting: Family Medicine

## 2022-04-22 ENCOUNTER — Ambulatory Visit: Payer: Medicaid Other | Admitting: Family Medicine

## 2022-04-22 VITALS — BP 109/71 | HR 69 | Temp 98.5°F | Resp 16 | Wt 250.1 lb

## 2022-04-22 DIAGNOSIS — I152 Hypertension secondary to endocrine disorders: Secondary | ICD-10-CM

## 2022-04-22 DIAGNOSIS — N183 Chronic kidney disease, stage 3 unspecified: Secondary | ICD-10-CM

## 2022-04-22 DIAGNOSIS — E1169 Type 2 diabetes mellitus with other specified complication: Secondary | ICD-10-CM

## 2022-04-22 DIAGNOSIS — N1831 Chronic kidney disease, stage 3a: Secondary | ICD-10-CM

## 2022-04-22 DIAGNOSIS — E785 Hyperlipidemia, unspecified: Secondary | ICD-10-CM | POA: Diagnosis not present

## 2022-04-22 DIAGNOSIS — E038 Other specified hypothyroidism: Secondary | ICD-10-CM

## 2022-04-22 DIAGNOSIS — Z1283 Encounter for screening for malignant neoplasm of skin: Secondary | ICD-10-CM | POA: Diagnosis not present

## 2022-04-22 DIAGNOSIS — E1159 Type 2 diabetes mellitus with other circulatory complications: Secondary | ICD-10-CM

## 2022-04-22 DIAGNOSIS — E1122 Type 2 diabetes mellitus with diabetic chronic kidney disease: Secondary | ICD-10-CM

## 2022-04-22 LAB — POCT GLYCOSYLATED HEMOGLOBIN (HGB A1C)
Est. average glucose Bld gHb Est-mCnc: 120
Hemoglobin A1C: 5.8 % — AB (ref 4.0–5.6)

## 2022-04-22 MED ORDER — LISINOPRIL 5 MG PO TABS: 5.0000 mg | ORAL_TABLET | Freq: Every day | ORAL | 1 refills | Status: DC

## 2022-04-22 NOTE — Assessment & Plan Note (Signed)
Followed by endocrinology Previously well controlled Continue Synthroid at current dose  Recheck TSH as she has lost weight recently and adjust Synthroid as indicated

## 2022-04-22 NOTE — Assessment & Plan Note (Signed)
Previously well controlled Continue statin Recheck FLP and CMP

## 2022-04-22 NOTE — Assessment & Plan Note (Signed)
Well-controlled with [name] 5.8 Up-to-date on screenings, but needs eye exam, so referred to ophthalmology Continue Ozempic On statin Resume lisinopril low-dose Follow-up in 6 months

## 2022-04-22 NOTE — Assessment & Plan Note (Signed)
Well controlled Continue current medications Recheck metabolic panel F/u in 6 months  

## 2022-04-22 NOTE — Assessment & Plan Note (Signed)
BMI greater than 35 and associated with HTN and DM Followed by healthy weight and wellness Congratulated on weight loss Discussed importance of healthy weight management Discussed diet and exercise

## 2022-04-22 NOTE — Assessment & Plan Note (Signed)
Chronic and stable Recheck metabolic panel Avoid nephrotoxic meds  

## 2022-04-23 LAB — COMPREHENSIVE METABOLIC PANEL
ALT: 21 IU/L (ref 0–32)
AST: 16 IU/L (ref 0–40)
Albumin/Globulin Ratio: 1.8 (ref 1.2–2.2)
Albumin: 4.6 g/dL (ref 3.8–4.9)
Alkaline Phosphatase: 107 IU/L (ref 44–121)
BUN/Creatinine Ratio: 16 (ref 9–23)
BUN: 23 mg/dL (ref 6–24)
Bilirubin Total: 0.2 mg/dL (ref 0.0–1.2)
CO2: 25 mmol/L (ref 20–29)
Calcium: 9.7 mg/dL (ref 8.7–10.2)
Chloride: 104 mmol/L (ref 96–106)
Creatinine, Ser: 1.4 mg/dL — ABNORMAL HIGH (ref 0.57–1.00)
Globulin, Total: 2.5 g/dL (ref 1.5–4.5)
Glucose: 90 mg/dL (ref 70–99)
Potassium: 4.3 mmol/L (ref 3.5–5.2)
Sodium: 143 mmol/L (ref 134–144)
Total Protein: 7.1 g/dL (ref 6.0–8.5)
eGFR: 44 mL/min/{1.73_m2} — ABNORMAL LOW (ref >59)

## 2022-04-23 LAB — LIPID PANEL
Chol/HDL Ratio: 4.6 ratio — ABNORMAL HIGH (ref 0.0–4.4)
Cholesterol, Total: 175 mg/dL (ref 100–199)
HDL: 38 mg/dL — ABNORMAL LOW (ref >39)
LDL Chol Calc (NIH): 95 mg/dL (ref 0–99)
Triglycerides: 248 mg/dL — ABNORMAL HIGH (ref 0–149)
VLDL Cholesterol Cal: 42 mg/dL — ABNORMAL HIGH (ref 5–40)

## 2022-04-23 LAB — TSH: TSH: 1.48 u[IU]/mL (ref 0.450–4.500)

## 2022-04-24 ENCOUNTER — Telehealth (HOSPITAL_COMMUNITY): Payer: Self-pay | Admitting: Physical Therapy

## 2022-04-24 ENCOUNTER — Encounter (HOSPITAL_COMMUNITY): Payer: Medicaid Other | Admitting: Physical Therapy

## 2022-04-24 NOTE — Telephone Encounter (Signed)
She has some things come up and needs to cx these two apptments - she wil return on 05/08/22.

## 2022-04-25 ENCOUNTER — Encounter (INDEPENDENT_AMBULATORY_CARE_PROVIDER_SITE_OTHER): Payer: Self-pay | Admitting: Family Medicine

## 2022-04-25 ENCOUNTER — Encounter (HOSPITAL_COMMUNITY): Payer: Medicaid Other | Admitting: Physical Therapy

## 2022-04-25 NOTE — Telephone Encounter (Signed)
Please advise 

## 2022-04-26 ENCOUNTER — Other Ambulatory Visit: Payer: Self-pay | Admitting: *Deleted

## 2022-04-26 DIAGNOSIS — F3132 Bipolar disorder, current episode depressed, moderate: Secondary | ICD-10-CM | POA: Diagnosis not present

## 2022-04-26 DIAGNOSIS — I871 Compression of vein: Secondary | ICD-10-CM

## 2022-04-27 ENCOUNTER — Other Ambulatory Visit: Payer: Self-pay | Admitting: Family Medicine

## 2022-04-27 DIAGNOSIS — M792 Neuralgia and neuritis, unspecified: Secondary | ICD-10-CM

## 2022-04-27 DIAGNOSIS — F3132 Bipolar disorder, current episode depressed, moderate: Secondary | ICD-10-CM | POA: Diagnosis not present

## 2022-04-27 DIAGNOSIS — E1142 Type 2 diabetes mellitus with diabetic polyneuropathy: Secondary | ICD-10-CM

## 2022-04-29 ENCOUNTER — Other Ambulatory Visit (INDEPENDENT_AMBULATORY_CARE_PROVIDER_SITE_OTHER): Payer: Self-pay | Admitting: Family Medicine

## 2022-04-29 ENCOUNTER — Other Ambulatory Visit (HOSPITAL_COMMUNITY): Payer: Self-pay

## 2022-04-29 ENCOUNTER — Encounter (INDEPENDENT_AMBULATORY_CARE_PROVIDER_SITE_OTHER): Payer: Self-pay | Admitting: Family Medicine

## 2022-04-29 DIAGNOSIS — E1165 Type 2 diabetes mellitus with hyperglycemia: Secondary | ICD-10-CM

## 2022-04-29 MED ORDER — OZEMPIC (0.25 OR 0.5 MG/DOSE) 2 MG/3ML ~~LOC~~ SOPN
0.5000 mg | PEN_INJECTOR | SUBCUTANEOUS | 0 refills | Status: DC
  Filled 2022-04-29 (×2): qty 3, 28d supply, fill #0

## 2022-04-29 NOTE — Telephone Encounter (Signed)
Requested medications are due for refill today.  yes  Requested medications are on the active medications list.  yes  Last refill. 02/22/2022 #120 1 refill  Future visit scheduled.   yes  Notes to clinic.  Medication refill is not delegated.    Requested Prescriptions  Pending Prescriptions Disp Refills   pregabalin (LYRICA) 100 MG capsule [Pharmacy Med Name: pregabalin 100 mg capsule] 120 capsule 1    Sig: TAKE ONE CAPSULE BY MOUTH FOUR TIMES DAILY     Not Delegated - Neurology:  Anticonvulsants - Controlled - pregabalin Failed - 04/27/2022  8:04 AM      Failed - This refill cannot be delegated      Failed - Cr in normal range and within 360 days    Creatinine, Ser  Date Value Ref Range Status  04/22/2022 1.40 (H) 0.57 - 1.00 mg/dL Final         Passed - Completed PHQ-2 or PHQ-9 in the last 360 days      Passed - Valid encounter within last 12 months    Recent Outpatient Visits           1 week ago Type 2 diabetes mellitus with stage 3a chronic kidney disease, without long-term current use of insulin (HCC)   [facility], Marzella Schlein, MD   2 months ago Diabetic peripheral neuropathy [facility])   [facility], Marzella Schlein, MD   6 months ago Encounter for annual physical exam   [facility], Marzella Schlein, MD   7 months ago Dyspnea on exertion   [facility], Marzella Schlein, MD   11 months ago Urinary frequency   [facility], Marzella Schlein, MD       Future Appointments             In 5 months Bacigalupo, Marzella Schlein, MD [facility]   In 6 months Willeen Niece, MD [facility]   In 8 months McGowan, Elana Alm [facility]

## 2022-05-01 ENCOUNTER — Ambulatory Visit: Payer: Medicaid Other

## 2022-05-01 ENCOUNTER — Encounter (HOSPITAL_COMMUNITY): Payer: Self-pay

## 2022-05-01 ENCOUNTER — Ambulatory Visit (HOSPITAL_COMMUNITY): Payer: Medicaid Other

## 2022-05-01 DIAGNOSIS — M25562 Pain in left knee: Secondary | ICD-10-CM | POA: Diagnosis not present

## 2022-05-01 DIAGNOSIS — M79672 Pain in left foot: Secondary | ICD-10-CM | POA: Diagnosis not present

## 2022-05-01 DIAGNOSIS — Z86718 Personal history of other venous thrombosis and embolism: Secondary | ICD-10-CM

## 2022-05-01 DIAGNOSIS — M5459 Other low back pain: Secondary | ICD-10-CM | POA: Diagnosis not present

## 2022-05-01 DIAGNOSIS — G8929 Other chronic pain: Secondary | ICD-10-CM | POA: Diagnosis not present

## 2022-05-01 DIAGNOSIS — R262 Difficulty in walking, not elsewhere classified: Secondary | ICD-10-CM

## 2022-05-01 DIAGNOSIS — M25561 Pain in right knee: Secondary | ICD-10-CM | POA: Diagnosis not present

## 2022-05-01 LAB — POCT INR
INR: 3.4 — AB (ref 2.0–3.0)
PT: 40.5

## 2022-05-01 NOTE — Therapy (Signed)
OUTPATIENT PHYSICAL THERAPY LOWER EXTREMITY TREATMENT   Patient Name: Jane [name] MRN: [id number] DOB:[birth year], 56 y.o., female Today's Date: 05/01/2022   PT End of Session - 05/01/22 1532     Visit Number 3    Number of Visits 12    Date for PT Re-Evaluation 05/27/22    Authorization Type [place] Medicaid Healthy*    Authorization Time Period approved 7 visits from 6/12-->06/13/22    Authorization - Visit Number 2    Authorization - Number of Visits 7    Progress Note Due on Visit 7    PT Start Time 1534    PT Stop Time 1615    PT Time Calculation (min) 41 min    Activity Tolerance Patient limited by pain;Patient tolerated treatment well    Behavior During Therapy WFL for tasks assessed/performed             Past Medical History:  Diagnosis Date   ADD (attention deficit disorder)    Anxiety    Back pain    Bilateral swelling of feet    Bipolar 1 disorder (HCC)    Bipolar 1 disorder (HCC)    Bipolar disorder (HCC)    Chewing difficulty    Chronic fatigue syndrome    Chronic kidney disease    Stage 3 kidney disease;dx by Dr. Sinda Du.    Constipation    Depression    Diabetes mellitus without complication (HCC)    diet controlled   Dyspnea    with exertion   GERD (gastroesophageal reflux disease)    Headache    migraines   High cholesterol    History of blood clots    History of DVT (deep vein thrombosis)    left leg   Hypertension    states under control with meds., has been on med. x 2 years   Hypothyroidism    Joint pain    Neuropathy    Obsessive-compulsive disorder    Peripheral vascular disease ([place])    Prediabetes    Respiratory failure requiring intubation ([place])    Restless leg syndrome    Shortness of breath    Sleep apnea    Thrombocytopenia ([place]) 09/15/2019   Trigger thumb of left hand 01/2018   Trigger thumb of right hand    Past Surgical History:  Procedure Laterality Date   ABDOMINAL HYSTERECTOMY  06/2016   complete    APPLICATION OF WOUND VAC Left 04/27/2019   Procedure: APPLICATION OF WOUND VAC LEFT GROIN;  Surgeon: Waynetta Sandy, MD;  Location: [place];  Service: Vascular;  Laterality: Left;   AV FISTULA PLACEMENT Left 11/24/2018   Procedure: ARTERIOVENOUS (AV) FISTULA CREATION LEFT SFA TO LEFT FEMORAL VEIN;  Surgeon: Waynetta Sandy, MD;  Location: [place];  Service: Vascular;  Laterality: Left;   BACK SURGERY     CHOLECYSTECTOMY     COLONOSCOPY WITH PROPOFOL N/A 11/22/2019   Procedure: COLONOSCOPY WITH PROPOFOL;  Surgeon: [name], MD;  Location: [facility];  Service: Gastroenterology;  Laterality: N/A;   FEMORAL ARTERY EXPLORATION  03/30/2019   Procedure: Left Common Femoral Artery and Vein Exploration;  Surgeon: Waynetta Sandy, MD;  Location: [place];  Service: Vascular;;   FEMORAL-FEMORAL BYPASS GRAFT Left 11/24/2018   Procedure: BYPASS GRAFT FEMORAL-FEMORAL VENOUS LEFT TO RIGHT PALMA PROCEDURE USING CRYOVEIN;  Surgeon: Waynetta Sandy, MD;  Location: [place];  Service: Vascular;  Laterality: Left;   GROIN DEBRIDEMENT Left 04/27/2019   Procedure: [name] DEBRIDEMENT;  Surgeon: Servando Snare  Harrell Gave, MD;  Location: [place];  Service: Vascular;  Laterality: Left;   INSERTION OF ILIAC STENT  03/30/2019   Procedure: Stent of left common, external iliac veins and left common femoral vein;  Surgeon: Waynetta Sandy, MD;  Location: [place];  Service: Vascular;;   KNEE ARTHROSCOPY WITH MENISCAL REPAIR Left 11/14/2020   Procedure: LEFT KNEE ARTHROSCOPY WITH PARTIAL MEDIAL MENISCECTOMY;  Surgeon: Carole Civil, MD;  Location: [facility];  Service: Orthopedics;  Laterality: Left;   LOWER EXTREMITY VENOGRAPHY N/A 08/17/2018   Procedure: LOWER EXTREMITY VENOGRAPHY - Central Venogram;  Surgeon: Waynetta Sandy, MD;  Location: [facility] [facility];  Service: Cardiovascular;  Laterality: N/A;   LOWER EXTREMITY VENOGRAPHY Bilateral 03/09/2019   Procedure: LOWER  EXTREMITY VENOGRAPHY;  Surgeon: Waynetta Sandy, MD;  Location: [place] [facility];  Service: Cardiovascular;  Laterality: Bilateral;   LOWER EXTREMITY VENOGRAPHY Left 08/16/2019   Procedure: LOWER EXTREMITY VENOGRAPHY;  Surgeon: Waynetta Sandy, MD;  Location: [place] [facility];  Service: Cardiovascular;  Laterality: Left;   LOWER EXTREMITY VENOGRAPHY Left 03/25/2022   Procedure: LOWER EXTREMITY VENOGRAPHY;  Surgeon: Waynetta Sandy, MD;  Location: [place] [facility];  Service: Cardiovascular;  Laterality: Left;  IVUS   LUMBAR FUSION  11/21/2000   L5-S1   LUMBAR SPINE SURGERY     x 2 others   PATCH ANGIOPLASTY Left 03/30/2019   Procedure: Patch Angioplasty of the Left Common Femoral Vein using Venosure Biologic patch;  Surgeon: Waynetta Sandy, MD;  Location: [facility];  Service: Vascular;  Laterality: Left;   PERIPHERAL VASCULAR INTERVENTION Left 08/16/2019   Procedure: PERIPHERAL VASCULAR INTERVENTION;  Surgeon: Waynetta Sandy, MD;  Location: [place] [facility];  Service: Cardiovascular;  Laterality: Left;  common femoral/femoral vein stent   PERIPHERAL VASCULAR INTERVENTION Left 03/25/2022   Procedure: PERIPHERAL VASCULAR INTERVENTION;  Surgeon: Waynetta Sandy, MD;  Location: [place] [facility];  Service: Cardiovascular;  Laterality: Left;  COMMON FEMORAL VEIN   PERIPHERAL VASCULAR THROMBECTOMY Left 03/25/2022   Procedure: PERIPHERAL VASCULAR THROMBECTOMY;  Surgeon: Waynetta Sandy, MD;  Location: [place] [facility];  Service: Cardiovascular;  Laterality: Left;  EXTREMITY/IVC   TRIGGER FINGER RELEASE Right 12/01/2017   Procedure: RELEASE TRIGGER FINGER/A-1 PULLEY RIGHT THUMB;  Surgeon: Leanora Cover, MD;  Location: [place];  Service: Orthopedics;  Laterality: Right;   TRIGGER FINGER RELEASE Left 01/26/2018   Procedure: LEFT TRIGGER THUMB RELEASE;  Surgeon: Leanora Cover, MD;  Location: [place];   Service: Orthopedics;  Laterality: Left;   ULTRASOUND GUIDANCE FOR VASCULAR ACCESS Right 03/30/2019   Procedure: Ultrasound-guided cannulation right internal jugular vein;  Surgeon: Waynetta Sandy, MD;  Location: [facility];  Service: Vascular;  Laterality: Right;   Patient Active Problem List   Diagnosis Date Noted   Chronic pain of both knees 10/22/2021   Hematuria 10/01/2021   Recurrent UTI 10/01/2021   Dyspnea on exertion 10/01/2021   Iron deficiency anemia 04/18/2021   Nocturnal sleep-related eating disorder 01/01/2021   Vitamin D deficiency 01/01/2021   Arthritis of left knee 12/26/2020   S/P left knee arthroscopy 11/14/20  11/21/2020   Old complex tear of medial meniscus of left knee    Primary osteoarthritis of left knee 11/09/2020   Subacromial bursitis of right shoulder joint 11/09/2020   Chronic pain syndrome 04/24/2020   Pharmacologic therapy 04/24/2020   Disorder of skeletal system 04/24/2020   Problems influencing health status 04/24/2020   History  of thrombocytopenia 04/24/2020   Abnormal MRI, cervical spine (01/08/2017) 04/24/2020   Abnormal MRI, lumbar spine (05/11/2014) 04/24/2020   DDD (degenerative disc disease), cervical 04/24/2020   DDD (degenerative disc disease), lumbar 04/24/2020   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   Diabetic peripheral neuropathy ([place]) 04/24/2020   Neurogenic pain 04/24/2020   Chronic musculoskeletal pain 04/24/2020   Other proteinuria 03/30/2020   Myalgia 03/15/2020   Bilateral hand swelling 02/16/2020   Thrush 01/20/2020   Morbid obesity ([place]) 10/25/2019   History of back surgery 10/25/2019   Peripheral neuropathy 10/25/2019   Restless leg syndrome 10/25/2019   Chronic low back pain (Bilateral) w/ sciatica (Bilateral) 10/25/2019   Hypertension associated with diabetes ([place]) 10/25/2019   Type 2 diabetes mellitus with stage 3 chronic kidney disease, without long-term current use of insulin ([place]) 10/25/2019   Chronic  anticoagulation (Coumadin) 10/25/2019   Hyperlipidemia associated with type 2 diabetes mellitus ([place]) 10/25/2019   CKD stage 3 due to type 2 diabetes mellitus ([place]) 10/25/2019   GERD (gastroesophageal reflux disease) 10/25/2019   Allergic rhinitis 10/25/2019   Chronic constipation 10/25/2019   Hypothyroidism    Normocytic anemia 09/15/2019   Iliac vein stenosis, 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   Chronic venous insufficiency 11/21/2017   Displacement of lumbar intervertebral disc without myelopathy 07/14/2017   Radicular low back pain 05/25/2014   Depression, major, recurrent ([place]) 06/17/2013   OCD (obsessive compulsive disorder) 11/20/2012   Insomnia due to mental disorder 09/25/2012   Bipolar 1 disorder ([place]) 09/25/2012   History of DVT (deep vein thrombosis) 08/18/2012    PCP: Lavon Paganini MD  REFERRING PROVIDER:  Kurtis Bushman, MD    REFERRING DIAG: PT eval/tx for M17.0 bilateral osteoarthritis of knees  THERAPY DIAG:  Chronic pain of both knees  Other low back pain  Pain in left foot  Difficulty in walking, not elsewhere classified  Rationale for Evaluation and Treatment Rehabilitation  ONSET DATE: Chronic   SUBJECTIVE:   SUBJECTIVE STATEMENT: Pt reports Saturday Lt knee buckled and has increased swelling and pain on medial aspect of knee.  Stated her knee cap feels a lot of pressure today.  Pt arrived wearing knee brace on Lt LE.    Eval subjective: Patient presents to therapy with complaint of bilateral knee, back , and LT foot pain. She reports issue is chronic. She is scheduled to have ortho consult for back. Sis suppose to get MRI and talk about "electrodes in my back". Patient currently with pain management and manages symptoms with prescribed medication. Patient has had prior therapy for knees with good result.    PERTINENT HISTORY: Lumbar surgery x 3, LT knee surgery, hx of blood clot  PAIN:  Are you having pain? Yes: NPRS scale:  8/10 Pain location: Low back, LT foot, LT knee  Pain description: sharp, constant, aching   Aggravating factors: "everything"  Relieving factors: sitting, laying down, leaning   PRECAUTIONS: None  WEIGHT BEARING RESTRICTIONS No  FALLS:  Has patient fallen in last 6 months? No  LIVING ENVIRONMENT: Lives with: lives with their family and lives alone Lives in: House/apartment Stairs: Yes: External: 3 steps; on right going up Has following equipment at home: Single point cane  OCCUPATION: Disability  PLOF: Independent with basic ADLs  PATIENT GOALS To avoid surgery    OBJECTIVE:   DIAGNOSTIC FINDINGS: Impression grade 1/2 arthritis left knee Impression normal right knee IMPRESSION: 1. Postsurgical changes at L5-S1. 2. Progression of degenerative changes most notable at  L4-L5.    PATIENT SURVEYS:  FOTO 47% function   COGNITION:  Overall cognitive status: Within functional limits for tasks assessed     Lumbar AROM:  Flexion: 60% limited Extension: 100% limited RT sidebend: 50% limited LT sidebend: 50% limited    LOWER EXTREMITY ROM: (unable to lay prone for hip extension testing)   Active ROM Right Eval 04/15/22 Left Eval 04/15/22 Right  05/01/22 Left 05/01/22  Hip flexion      Hip extension      Hip abduction      Hip adduction      Hip internal rotation      Hip external rotation      Knee flexion 110 110 130 125  Knee extension -6 -7 Lacking 3 Lacking 4  Ankle dorsiflexion      Ankle plantarflexion      Ankle inversion      Ankle eversion       (Blank rows = not tested)  LOWER EXTREMITY MMT:  MMT Right Eval 04/15/22 Left Eval 04/15/22  Hip flexion 5 5  Hip extension Unable  Unable   Hip abduction 4+ 4+  Hip adduction    Hip internal rotation    Hip external rotation    Knee flexion    Knee extension 5 5  Ankle dorsiflexion 5 5  Ankle plantarflexion    Ankle inversion    Ankle eversion     (Blank rows = not tested)    FUNCTIONAL  TESTS:  30 seconds chair stand test: 13 reps, hands on thighs   GAIT:  Decreased stride, antalgic, no AD    TODAY'S TREATMENT: 05/01/22: Homans DVT testing: no redness, heat, or pain with DF and gastroc squeeze Patella mobs all  Measurements for compression garments  Supine: SAQ 10x Bridge Heel slide: 10x AROM Lt kee: lacking 4-125 degrees AROM Rt knee lacking 3-130 degrees Bent knee raise with ab set 10x 5" LTR 5x 10"  Sidelying clam with RTB 10x 5"BLE  04/17/22: Supine: Diaphragmatic breathing x 1 min Ab bracing with exhale 3-5" holds x 2 min Bent knee raise with ab set 10x 5" Clam with RTB 10x 5" for stability Bridge 10x 5" Quad sets 10x 5" BLE  Seated: seated posture  Wback 10x   04/15/22 Bridge Ab brace    PATIENT EDUCATION:  Education details: 04/17/22:  Reviewed goals, educated importance of HEP compliance with HEP compliance for maximal benefits, educated benefits with compression garments for edema control and hx of blood clot, educated benefits with posture for pain control. EvaL: on evaluation findings, POC and HEP Person educated: Patient Education method: Explanation Education comprehension: verbalized understanding   HOME EXERCISE PROGRAM: Access Code: [email or web address] URL: [email or web address] Date: 04/15/2022 Prepared by: Josue Hector  Exercises - Supine Transversus Abdominis Bracing - Hands on Stomach  - 2-3 x daily - 7 x weekly - 2 sets - 10 reps - 5 second hold - Supine Bridge  - 2-3 x daily - 7 x weekly - 2 sets - 10 reps - 5 second hold  04/17/22: Quad sets, Bent knee raise, clam with RTB 05/01/22: LTR  ASSESSMENT:  CLINICAL IMPRESSION: Pt arrived wearing brace Lt knee, noted decreased tolerance with weight bearing, increased swelling and reports of pressure on knee cap.  Homans testing complete for DVT with no pain with calf squeeze, no heat or redness.  Noted varicose veins present LE.  Reviewed benefits with compression  garments and measurements complete with ETI paperwork.  Session focus on  core, proximal strengthening and knee mobility.  Pt improved AROM BLE knees today.  No reports of increased pain through session, reports she feels better after exercises.   OBJECTIVE IMPAIRMENTS Abnormal gait, decreased activity tolerance, decreased ROM, increased fascial restrictions, impaired flexibility, improper body mechanics, and pain.   ACTIVITY LIMITATIONS lifting, bending, standing, squatting, stairs, and locomotion level  PARTICIPATION LIMITATIONS: meal prep, cleaning, laundry, shopping, community activity, and yard work  PERSONAL FACTORS 3+ comorbidities: arthritis, multiple body regions, chronicity  are also affecting patient's functional outcome.   REHAB POTENTIAL: Fair see above  CLINICAL DECISION MAKING: Stable/uncomplicated  EVALUATION COMPLEXITY: Low   GOALS: SHORT TERM GOALS: Target date: 05/06/2022  Patient will be independent with initial HEP and self-management strategies to improve functional outcomes Baseline:  Goal status: Ongoing  LONG TERM GOALS: Target date: 05/27/2022  Patient will be independent with advanced HEP and self-management strategies to improve functional outcomes Baseline:  Goal status: Ongoing  2.  Patient will improve FOTO score to predicted value to indicate improvement in functional outcomes Baseline: 47% function Goal status: Ongoing  3.  Patient will report reduction of back and knee pain to <4/10 at rest for improved quality of life and ability to perform ADLs  Baseline: 9/10 Goal status: Ongoing  4. Patient will be able to complete at least 20 reps during 30 second sit to stand to demo improved functional strength and ability to perform ADLs.  Baseline: 13 reps Goal status: Ongoing  PLAN: PT FREQUENCY: 2x/week  PT DURATION: 6 weeks  PLANNED INTERVENTIONS: Therapeutic exercises, Therapeutic activity, Neuromuscular re-education, Balance training, Gait  training, Patient/Family education, Joint manipulation, Joint mobilization, Stair training, Aquatic Therapy, Dry Needling, Electrical stimulation, Spinal manipulation, Spinal mobilization, Cryotherapy, Moist heat, scar mobilization, Taping, Traction, Ultrasound, Biofeedback, Ionotophoresis '4mg'$ /ml Dexamethasone, and Manual therapy.   PLAN FOR NEXT SESSION: Progress core, glute and quad strengthening as tolerated. Knee and lumbar mobility progression as able. F/u with knee and compression garments  Ihor Austin, LPTA/CLT; CBIS [phone]  4:27 PM, 05/01/22

## 2022-05-01 NOTE — Patient Instructions (Signed)
Description   Hold for 1 day, the 5 mg daily except 2.5 mg on Fri

## 2022-05-02 ENCOUNTER — Other Ambulatory Visit (HOSPITAL_COMMUNITY): Payer: Self-pay

## 2022-05-02 ENCOUNTER — Ambulatory Visit (HOSPITAL_COMMUNITY)
Admission: RE | Admit: 2022-05-02 | Discharge: 2022-05-02 | Disposition: A | Discharge status: Home / Self Care | Payer: Medicaid Other | Source: Ambulatory Visit | Attending: Neurosurgery | Admitting: Neurosurgery

## 2022-05-02 DIAGNOSIS — G629 Polyneuropathy, unspecified: Secondary | ICD-10-CM | POA: Insufficient documentation

## 2022-05-02 DIAGNOSIS — G894 Chronic pain syndrome: Secondary | ICD-10-CM

## 2022-05-02 DIAGNOSIS — M5416 Radiculopathy, lumbar region: Secondary | ICD-10-CM | POA: Insufficient documentation

## 2022-05-02 DIAGNOSIS — M545 Low back pain, unspecified: Secondary | ICD-10-CM | POA: Diagnosis not present

## 2022-05-02 DIAGNOSIS — M549 Dorsalgia, unspecified: Secondary | ICD-10-CM | POA: Diagnosis not present

## 2022-05-03 ENCOUNTER — Encounter (HOSPITAL_COMMUNITY): Payer: Self-pay

## 2022-05-03 ENCOUNTER — Ambulatory Visit (HOSPITAL_COMMUNITY): Payer: Medicaid Other

## 2022-05-03 DIAGNOSIS — R262 Difficulty in walking, not elsewhere classified: Secondary | ICD-10-CM | POA: Diagnosis not present

## 2022-05-03 DIAGNOSIS — M25561 Pain in right knee: Secondary | ICD-10-CM | POA: Diagnosis not present

## 2022-05-03 DIAGNOSIS — G8929 Other chronic pain: Secondary | ICD-10-CM | POA: Diagnosis not present

## 2022-05-03 DIAGNOSIS — M5459 Other low back pain: Secondary | ICD-10-CM

## 2022-05-03 DIAGNOSIS — M79672 Pain in left foot: Secondary | ICD-10-CM | POA: Diagnosis not present

## 2022-05-03 DIAGNOSIS — M25562 Pain in left knee: Secondary | ICD-10-CM | POA: Diagnosis not present

## 2022-05-03 DIAGNOSIS — F3132 Bipolar disorder, current episode depressed, moderate: Secondary | ICD-10-CM | POA: Diagnosis not present

## 2022-05-03 NOTE — Therapy (Signed)
OUTPATIENT PHYSICAL THERAPY LOWER EXTREMITY TREATMENT   Patient Name: Jane [name] MRN: [id number] DOB:1966/07/10, 56 y.o., female Today's Date: 05/03/2022   PT End of Session - 05/03/22 0958     Visit Number 4    Number of Visits 12    Date for PT Re-Evaluation 05/27/22    Authorization Type  Medicaid Healthy*    Authorization Time Period approved 7 visits from 6/12-->06/13/22    Authorization - Visit Number 4    Authorization - Number of Visits 7    Progress Note Due on Visit 7    PT Start Time 0918    PT Stop Time 0958    PT Time Calculation (min) 40 min    Activity Tolerance Patient limited by pain;Patient tolerated treatment well    Behavior During Therapy WFL for tasks assessed/performed              Past Medical History:  Diagnosis Date   ADD (attention deficit disorder)    Anxiety    Back pain    Bilateral swelling of feet    Bipolar 1 disorder (HCC)    Bipolar 1 disorder (HCC)    Bipolar disorder (HCC)    Chewing difficulty    Chronic fatigue syndrome    Chronic kidney disease    Stage 3 kidney disease;dx by Dr. Sinda Du.    Constipation    Depression    Diabetes mellitus without complication (HCC)    diet controlled   Dyspnea    with exertion   GERD (gastroesophageal reflux disease)    Headache    migraines   High cholesterol    History of blood clots    History of DVT (deep vein thrombosis)    left leg   Hypertension    states under control with meds., has been on med. x 2 years   Hypothyroidism    Joint pain    Neuropathy    Obsessive-compulsive disorder    Peripheral vascular disease ([place])    Prediabetes    Respiratory failure requiring intubation ([place])    Restless leg syndrome    Shortness of breath    Sleep apnea    Thrombocytopenia ([place]) 09/15/2019   Trigger thumb of left hand 01/2018   Trigger thumb of right hand    Past Surgical History:  Procedure Laterality Date   ABDOMINAL HYSTERECTOMY  06/2016   complete    APPLICATION OF WOUND VAC Left 04/27/2019   Procedure: APPLICATION OF WOUND VAC LEFT GROIN;  Surgeon: Waynetta Sandy, MD;  Location: [facility];  Service: Vascular;  Laterality: Left;   AV FISTULA PLACEMENT Left 11/24/2018   Procedure: ARTERIOVENOUS (AV) FISTULA CREATION LEFT SFA TO LEFT FEMORAL VEIN;  Surgeon: Waynetta Sandy, MD;  Location: [facility];  Service: Vascular;  Laterality: Left;   BACK SURGERY     CHOLECYSTECTOMY     COLONOSCOPY WITH PROPOFOL N/A 11/22/2019   Procedure: COLONOSCOPY WITH PROPOFOL;  Surgeon: [name], MD;  Location: [facility] ENDOSCOPY;  Service: Gastroenterology;  Laterality: N/A;   FEMORAL ARTERY EXPLORATION  03/30/2019   Procedure: Left Common Femoral Artery and Vein Exploration;  Surgeon: Waynetta Sandy, MD;  Location: [facility];  Service: Vascular;;   FEMORAL-FEMORAL BYPASS GRAFT Left 11/24/2018   Procedure: BYPASS GRAFT FEMORAL-FEMORAL VENOUS LEFT TO RIGHT PALMA PROCEDURE USING CRYOVEIN;  Surgeon: Waynetta Sandy, MD;  Location: [facility];  Service: Vascular;  Laterality: Left;   GROIN DEBRIDEMENT Left 04/27/2019   Procedure: [name] DEBRIDEMENT;  Surgeon: Donzetta Matters,  Georgia Dom, MD;  Location: [facility];  Service: Vascular;  Laterality: Left;   INSERTION OF ILIAC STENT  03/30/2019   Procedure: Stent of left common, external iliac veins and left common femoral vein;  Surgeon: Waynetta Sandy, MD;  Location: [place];  Service: Vascular;;   KNEE ARTHROSCOPY WITH MENISCAL REPAIR Left 11/14/2020   Procedure: LEFT KNEE ARTHROSCOPY WITH PARTIAL MEDIAL MENISCECTOMY;  Surgeon: Carole Civil, MD;  Location: [facility];  Service: Orthopedics;  Laterality: Left;   LOWER EXTREMITY VENOGRAPHY N/A 08/17/2018   Procedure: LOWER EXTREMITY VENOGRAPHY - Central Venogram;  Surgeon: Waynetta Sandy, MD;  Location: [facility];  Service: Cardiovascular;  Laterality: N/A;   LOWER EXTREMITY VENOGRAPHY Bilateral 03/09/2019   Procedure: LOWER  EXTREMITY VENOGRAPHY;  Surgeon: Waynetta Sandy, MD;  Location: [facility] [facility];  Service: Cardiovascular;  Laterality: Bilateral;   LOWER EXTREMITY VENOGRAPHY Left 08/16/2019   Procedure: LOWER EXTREMITY VENOGRAPHY;  Surgeon: Waynetta Sandy, MD;  Location: [facility];  Service: Cardiovascular;  Laterality: Left;   LOWER EXTREMITY VENOGRAPHY Left 03/25/2022   Procedure: LOWER EXTREMITY VENOGRAPHY;  Surgeon: Waynetta Sandy, MD;  Location: [place] [facility];  Service: Cardiovascular;  Laterality: Left;  IVUS   LUMBAR FUSION  11/21/2000   L5-S1   LUMBAR SPINE SURGERY     x 2 others   PATCH ANGIOPLASTY Left 03/30/2019   Procedure: Patch Angioplasty of the Left Common Femoral Vein using Venosure Biologic patch;  Surgeon: Waynetta Sandy, MD;  Location: [facility];  Service: Vascular;  Laterality: Left;   PERIPHERAL VASCULAR INTERVENTION Left 08/16/2019   Procedure: PERIPHERAL VASCULAR INTERVENTION;  Surgeon: Waynetta Sandy, MD;  Location: [facility];  Service: Cardiovascular;  Laterality: Left;  common femoral/femoral vein stent   PERIPHERAL VASCULAR INTERVENTION Left 03/25/2022   Procedure: PERIPHERAL VASCULAR INTERVENTION;  Surgeon: Waynetta Sandy, MD;  Location: [facility];  Service: Cardiovascular;  Laterality: Left;  COMMON FEMORAL VEIN   PERIPHERAL VASCULAR THROMBECTOMY Left 03/25/2022   Procedure: PERIPHERAL VASCULAR THROMBECTOMY;  Surgeon: Waynetta Sandy, MD;  Location: [facility];  Service: Cardiovascular;  Laterality: Left;  EXTREMITY/IVC   TRIGGER FINGER RELEASE Right 12/01/2017   Procedure: RELEASE TRIGGER FINGER/A-1 PULLEY RIGHT THUMB;  Surgeon: Leanora Cover, MD;  Location: [facility];  Service: Orthopedics;  Laterality: Right;   TRIGGER FINGER RELEASE Left 01/26/2018   Procedure: LEFT TRIGGER THUMB RELEASE;  Surgeon: Leanora Cover, MD;  Location: [facility];   Service: Orthopedics;  Laterality: Left;   ULTRASOUND GUIDANCE FOR VASCULAR ACCESS Right 03/30/2019   Procedure: Ultrasound-guided cannulation right internal jugular vein;  Surgeon: Waynetta Sandy, MD;  Location: [facility];  Service: Vascular;  Laterality: Right;   Patient Active Problem List   Diagnosis Date Noted   Chronic pain of both knees 10/22/2021   Hematuria 10/01/2021   Recurrent UTI 10/01/2021   Dyspnea on exertion 10/01/2021   Iron deficiency anemia 04/18/2021   Nocturnal sleep-related eating disorder 01/01/2021   Vitamin D deficiency 01/01/2021   Arthritis of left knee 12/26/2020   S/P left knee arthroscopy 11/14/20  11/21/2020   Old complex tear of medial meniscus of left knee    Primary osteoarthritis of left knee 11/09/2020   Subacromial bursitis of right shoulder joint 11/09/2020   Chronic pain syndrome 04/24/2020   Pharmacologic therapy 04/24/2020   Disorder of skeletal system 04/24/2020   Problems influencing health status 04/24/2020  History of thrombocytopenia 04/24/2020   Abnormal MRI, cervical spine (01/08/2017) 04/24/2020   Abnormal MRI, lumbar spine (05/11/2014) 04/24/2020   DDD (degenerative disc disease), cervical 04/24/2020   DDD (degenerative disc disease), lumbar 04/24/2020   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   Diabetic peripheral neuropathy ([place]) 04/24/2020   Neurogenic pain 04/24/2020   Chronic musculoskeletal pain 04/24/2020   Other proteinuria 03/30/2020   Myalgia 03/15/2020   Bilateral hand swelling 02/16/2020   Thrush 01/20/2020   Morbid obesity ([place]) 10/25/2019   History of back surgery 10/25/2019   Peripheral neuropathy 10/25/2019   Restless leg syndrome 10/25/2019   Chronic low back pain (Bilateral) w/ sciatica (Bilateral) 10/25/2019   Hypertension associated with diabetes ([place]) 10/25/2019   Type 2 diabetes mellitus with stage 3 chronic kidney disease, without long-term current use of insulin ([place]) 10/25/2019   Chronic  anticoagulation (Coumadin) 10/25/2019   Hyperlipidemia associated with type 2 diabetes mellitus ([place]) 10/25/2019   CKD stage 3 due to type 2 diabetes mellitus ([place]) 10/25/2019   GERD (gastroesophageal reflux disease) 10/25/2019   Allergic rhinitis 10/25/2019   Chronic constipation 10/25/2019   Hypothyroidism    Normocytic anemia 09/15/2019   Iliac vein stenosis, 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   Chronic venous insufficiency 11/21/2017   Displacement of lumbar intervertebral disc without myelopathy 07/14/2017   Radicular low back pain 05/25/2014   Depression, major, recurrent ([place]) 06/17/2013   OCD (obsessive compulsive disorder) 11/20/2012   Insomnia due to mental disorder 09/25/2012   Bipolar 1 disorder ([place]) 09/25/2012   History of DVT (deep vein thrombosis) 08/18/2012    PCP: Lavon Paganini MD  REFERRING PROVIDER:  Kurtis Bushman, MD  Apt: MD Donzetta Matters 05/08/22  REFERRING DIAG: PT eval/tx for M17.0 bilateral osteoarthritis of knees  THERAPY DIAG:  Chronic pain of both knees  Other low back pain  Pain in left foot  Difficulty in walking, not elsewhere classified  Rationale for Evaluation and Treatment Rehabilitation  ONSET DATE: Chronic   SUBJECTIVE:   SUBJECTIVE STATEMENT: Reports she has MRI done yesterday, reports she continues to have high pain scale in back and edema present Lt knee.  Pain scale 8/10 today.  Stated her body can tell it's raining today.  Reports she has not called about compression garment.  Eval subjective: Patient presents to therapy with complaint of bilateral knee, back , and LT foot pain. She reports issue is chronic. She is scheduled to have ortho consult for back. Sis suppose to get MRI and talk about "electrodes in my back". Patient currently with pain management and manages symptoms with prescribed medication. Patient has had prior therapy for knees with good result.    PERTINENT HISTORY: Lumbar surgery x 3, LT knee surgery,  hx of blood clot  PAIN:  Are you having pain? Yes: NPRS scale: 8/10 Pain location: Low back, ]LT knee  Pain description: sharp, constant, aching   Aggravating factors: "everything"  Relieving factors: sitting, laying down, leaning   PRECAUTIONS: None  WEIGHT BEARING RESTRICTIONS No  FALLS:  Has patient fallen in last 6 months? No  LIVING ENVIRONMENT: Lives with: lives with their family and lives alone Lives in: House/apartment Stairs: Yes: External: 3 steps; on right going up Has following equipment at home: Single point cane  OCCUPATION: Disability  PLOF: Independent with basic ADLs  PATIENT GOALS To avoid surgery    OBJECTIVE:   DIAGNOSTIC FINDINGS: Impression grade 1/2 arthritis left knee Impression normal right knee IMPRESSION: 1. Postsurgical changes at L5-S1. 2.  Progression of degenerative changes most notable at L4-L5.    PATIENT SURVEYS:  FOTO 47% function   COGNITION:  Overall cognitive status: Within functional limits for tasks assessed     Lumbar AROM:  Flexion: 60% limited Extension: 100% limited RT sidebend: 50% limited LT sidebend: 50% limited    LOWER EXTREMITY ROM: (unable to lay prone for hip extension testing)   Active ROM Right Eval 04/15/22 Left Eval 04/15/22 Right  05/01/22 Left 05/01/22  Hip flexion      Hip extension      Hip abduction      Hip adduction      Hip internal rotation      Hip external rotation      Knee flexion 110 110 130 125  Knee extension -6 -7 Lacking 3 Lacking 4  Ankle dorsiflexion      Ankle plantarflexion      Ankle inversion      Ankle eversion       (Blank rows = not tested)  LOWER EXTREMITY MMT:  MMT Right Eval 04/15/22 Left Eval 04/15/22  Hip flexion 5 5  Hip extension Unable  Unable   Hip abduction 4+ 4+  Hip adduction    Hip internal rotation    Hip external rotation    Knee flexion    Knee extension 5 5  Ankle dorsiflexion 5 5  Ankle plantarflexion    Ankle inversion    Ankle  eversion     (Blank rows = not tested)    FUNCTIONAL TESTS:  30 seconds chair stand test: 13 reps, hands on thighs   GAIT:  Decreased stride, antalgic, no AD    TODAY'S TREATMENT: 05/03/22: Supine: Bridge 10x Quad set prior SLR floating with ab set 10x SKTC 2x 30" LTR 5x 10"  Prone: Prone x 2 min POE x 34mn Prone heel squeeze 10 x5"   Sidelying: Abd 10x BLE  Standing: STS 10x eccentric control no HHA Standing extension 10x 5" TKE with RTB 10x5  05/01/22: Homans DVT testing: no redness, heat, or pain with DF and gastroc squeeze Patella mobs all  Measurements for compression garments  Supine: SAQ 10x Bridge Heel slide: 10x AROM Lt kee: lacking 4-125 degrees AROM Rt knee lacking 3-130 degrees Bent knee raise with ab set 10x 5" LTR 5x 10"  Sidelying clam with RTB 10x 5"BLE  04/17/22: Supine: Diaphragmatic breathing x 1 min Ab bracing with exhale 3-5" holds x 2 min Bent knee raise with ab set 10x 5" Clam with RTB 10x 5" for stability Bridge 10x 5" Quad sets 10x 5" BLE  Seated: seated posture  Wback 10x   04/15/22 Bridge Ab brace    PATIENT EDUCATION:  Education details: 04/17/22:  Reviewed goals, educated importance of HEP compliance with HEP compliance for maximal benefits, educated benefits with compression garments for edema control and hx of blood clot, educated benefits with posture for pain control. EvaL: on evaluation findings, POC and HEP Person educated: Patient Education method: Explanation Education comprehension: verbalized understanding   HOME EXERCISE PROGRAM: Access Code: [email or web address] URL: [email or web address] Date: 04/15/2022 Prepared by: CJosue Hector Exercises - Supine Transversus Abdominis Bracing - Hands on Stomach  - 2-3 x daily - 7 x weekly - 2 sets - 10 reps - 5 second hold - Supine Bridge  - 2-3 x daily - 7 x weekly - 2 sets - 10 reps - 5 second hold  04/17/22: Quad sets, Bent knee raise, clam with  RTB 05/01/22: LTR 05/03/22:  Prone on elbow x 2 min, heel squeeze.  ASSESSMENT:  CLINICAL IMPRESSION: Session focus with lumbar mobility and core/proximal strengthening.  Added prone exercises to improve lumbar extension and gluteal strengthening that was tolerated well.  Progressed to standing exercises as well for mobility and quad strengthening with additional TKE exercises this session.  No reports of increased pain.  Added prone on elbow and heel squeeze to HEP.  OBJECTIVE IMPAIRMENTS Abnormal gait, decreased activity tolerance, decreased ROM, increased fascial restrictions, impaired flexibility, improper body mechanics, and pain.   ACTIVITY LIMITATIONS lifting, bending, standing, squatting, stairs, and locomotion level  PARTICIPATION LIMITATIONS: meal prep, cleaning, laundry, shopping, community activity, and yard work  PERSONAL FACTORS 3+ comorbidities: arthritis, multiple body regions, chronicity  are also affecting patient's functional outcome.   REHAB POTENTIAL: Fair see above  CLINICAL DECISION MAKING: Stable/uncomplicated  EVALUATION COMPLEXITY: Low   GOALS: SHORT TERM GOALS: Target date: 05/06/2022  Patient will be independent with initial HEP and self-management strategies to improve functional outcomes Baseline:  Goal status: Ongoing  LONG TERM GOALS: Target date: 05/27/2022  Patient will be independent with advanced HEP and self-management strategies to improve functional outcomes Baseline:  Goal status: Ongoing  2.  Patient will improve FOTO score to predicted value to indicate improvement in functional outcomes Baseline: 47% function Goal status: Ongoing  3.  Patient will report reduction of back and knee pain to <4/10 at rest for improved quality of life and ability to perform ADLs  Baseline: 9/10 Goal status: Ongoing  4. Patient will be able to complete at least 20 reps during 30 second sit to stand to demo improved functional strength and ability to  perform ADLs.  Baseline: 13 reps Goal status: Ongoing  PLAN: PT FREQUENCY: 2x/week  PT DURATION: 6 weeks  PLANNED INTERVENTIONS: Therapeutic exercises, Therapeutic activity, Neuromuscular re-education, Balance training, Gait training, Patient/Family education, Joint manipulation, Joint mobilization, Stair training, Aquatic Therapy, Dry Needling, Electrical stimulation, Spinal manipulation, Spinal mobilization, Cryotherapy, Moist heat, scar mobilization, Taping, Traction, Ultrasound, Biofeedback, Ionotophoresis '4mg'$ /ml Dexamethasone, and Manual therapy.   PLAN FOR NEXT SESSION: Progress core, glute and quad strengthening as tolerated. Knee and lumbar mobility progression as able. F/u with knee and compression garments  Ihor Austin, LPTA/CLT; CBIS [phone]  12:20 PM, 05/03/22

## 2022-05-04 DIAGNOSIS — F3132 Bipolar disorder, current episode depressed, moderate: Secondary | ICD-10-CM | POA: Diagnosis not present

## 2022-05-06 ENCOUNTER — Telehealth (HOSPITAL_COMMUNITY): Payer: Self-pay | Admitting: Physical Therapy

## 2022-05-06 NOTE — Telephone Encounter (Signed)
Patient l/m to cx 05/08/22 -no reason given - requested called back. Called back to confirm no answer.

## 2022-05-08 ENCOUNTER — Encounter (HOSPITAL_COMMUNITY): Payer: Medicaid Other | Admitting: Physical Therapy

## 2022-05-08 ENCOUNTER — Ambulatory Visit (HOSPITAL_COMMUNITY)
Admission: RE | Admit: 2022-05-08 | Discharge: 2022-05-08 | Disposition: A | Discharge status: Home / Self Care | Payer: Medicaid Other | Source: Ambulatory Visit | Attending: Vascular Surgery | Admitting: Vascular Surgery

## 2022-05-08 ENCOUNTER — Ambulatory Visit: Payer: Medicaid Other

## 2022-05-08 ENCOUNTER — Other Ambulatory Visit: Payer: Self-pay

## 2022-05-08 ENCOUNTER — Ambulatory Visit (INDEPENDENT_AMBULATORY_CARE_PROVIDER_SITE_OTHER): Payer: Medicaid Other | Admitting: Vascular Surgery

## 2022-05-08 ENCOUNTER — Encounter: Payer: Self-pay | Admitting: Vascular Surgery

## 2022-05-08 VITALS — BP 138/69 | HR 67 | Temp 98.2°F | Resp 20 | Ht 70.0 in | Wt 250.0 lb

## 2022-05-08 DIAGNOSIS — I871 Compression of vein: Secondary | ICD-10-CM | POA: Diagnosis not present

## 2022-05-08 DIAGNOSIS — I87002 Postthrombotic syndrome without complications of left lower extremity: Secondary | ICD-10-CM

## 2022-05-08 NOTE — Progress Notes (Signed)
Patient ID: Jane [name], female   DOB: [birth year], 56 y.o.   MRN: [id number]  Reason for Consult: Routine Post Op   Referred by [name], MD  Subjective:     HPI:  Jane [name] is a 56 y.o. 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  Recently underwent recanalization of the left common external iliac artery and common femoral vein stents and restenting.  She has been remained on Coumadin.  She has not been wearing compression stockings.  She states that she has severe pain from her back radiating down both legs.  She is evaluated in [facility] for spinal cord stimulator.  She states that since the procedure she has really been having persistent pain in the left lower extremity and it swells worse throughout the day.  She does not have any ulceration or skin changes.  Past Medical History:  Diagnosis Date   ADD (attention deficit disorder)    Anxiety    Back pain    Bilateral swelling of feet    Bipolar 1 disorder (HCC)    Bipolar 1 disorder (HCC)    Bipolar disorder (HCC)    Chewing difficulty    Chronic fatigue syndrome    Chronic kidney disease    Stage 3 kidney disease;dx by Dr. Sinda Du.    Constipation    Depression    Diabetes mellitus without complication (HCC)    diet controlled   Dyspnea    with exertion   GERD (gastroesophageal reflux disease)    Headache    migraines   High cholesterol    History of blood clots    History of DVT (deep vein thrombosis)    left leg   Hypertension    states under control with meds., has been on med. x 2 years   Hypothyroidism    Joint pain    Neuropathy    Obsessive-compulsive disorder    Peripheral vascular disease ([place])    Prediabetes    Respiratory failure requiring intubation ([place])    Restless leg syndrome    Shortness of breath    Sleep apnea    Thrombocytopenia ([place]) 09/15/2019   Trigger thumb of left hand 01/2018   Trigger thumb of right hand     Family History  Problem Relation Age of Onset   Heart disease Mother    Hyperlipidemia Mother    Hypertension Mother    Bipolar disorder Mother    Stroke Mother    Depression Mother    Sleep apnea Mother    Obesity Mother    Diabetes Father    Heart disease Father    Hyperlipidemia Father    Hypertension Father    Sleep apnea Father    Obesity Father    Drug abuse Daughter    ADD / ADHD Daughter    Drug abuse Daughter    Anxiety disorder Daughter    Bipolar disorder Daughter    Hypertension Sister    Hypertension Brother    Hyperlipidemia Brother    Heart disease Brother    Bipolar disorder Maternal Aunt    Suicidality Maternal Aunt    Past Surgical History:  Procedure Laterality Date   ABDOMINAL HYSTERECTOMY  06/2016   complete   APPLICATION OF WOUND VAC Left 04/27/2019   Procedure: APPLICATION OF WOUND VAC LEFT GROIN;  Surgeon: Waynetta Sandy, MD;  Location: [place];  Service: Vascular;  Laterality: Left;   AV FISTULA PLACEMENT Left 11/24/2018  Procedure: ARTERIOVENOUS (AV) FISTULA CREATION LEFT SFA TO LEFT FEMORAL VEIN;  Surgeon: Waynetta Sandy, MD;  Location: [place];  Service: Vascular;  Laterality: Left;   BACK SURGERY     CHOLECYSTECTOMY     COLONOSCOPY WITH PROPOFOL N/A 11/22/2019   Procedure: COLONOSCOPY WITH PROPOFOL;  Surgeon: [name], MD;  Location: [facility];  Service: Gastroenterology;  Laterality: N/A;   FEMORAL ARTERY EXPLORATION  03/30/2019   Procedure: Left Common Femoral Artery and Vein Exploration;  Surgeon: Waynetta Sandy, MD;  Location: [address];  Service: Vascular;;   FEMORAL-FEMORAL BYPASS GRAFT Left 11/24/2018   Procedure: BYPASS GRAFT FEMORAL-FEMORAL VENOUS LEFT TO RIGHT PALMA PROCEDURE USING CRYOVEIN;  Surgeon: Waynetta Sandy, MD;  Location: [place];  Service: Vascular;  Laterality: Left;   GROIN DEBRIDEMENT Left 04/27/2019   Procedure: GROIN DEBRIDEMENT;  Surgeon: Waynetta Sandy, MD;   Location: [place];  Service: Vascular;  Laterality: Left;   INSERTION OF ILIAC STENT  03/30/2019   Procedure: Stent of left common, external iliac veins and left common femoral vein;  Surgeon: Waynetta Sandy, MD;  Location: [place];  Service: Vascular;;   KNEE ARTHROSCOPY WITH MENISCAL REPAIR Left 11/14/2020   Procedure: LEFT KNEE ARTHROSCOPY WITH PARTIAL MEDIAL MENISCECTOMY;  Surgeon: Carole Civil, MD;  Location: [facility];  Service: Orthopedics;  Laterality: Left;   LOWER EXTREMITY VENOGRAPHY N/A 08/17/2018   Procedure: LOWER EXTREMITY VENOGRAPHY - Central Venogram;  Surgeon: Waynetta Sandy, MD;  Location: [place] [facility];  Service: Cardiovascular;  Laterality: N/A;   LOWER EXTREMITY VENOGRAPHY Bilateral 03/09/2019   Procedure: LOWER EXTREMITY VENOGRAPHY;  Surgeon: Waynetta Sandy, MD;  Location: [place] [facility];  Service: Cardiovascular;  Laterality: Bilateral;   LOWER EXTREMITY VENOGRAPHY Left 08/16/2019   Procedure: LOWER EXTREMITY VENOGRAPHY;  Surgeon: Waynetta Sandy, MD;  Location: [place] [facility];  Service: Cardiovascular;  Laterality: Left;   LOWER EXTREMITY VENOGRAPHY Left 03/25/2022   Procedure: LOWER EXTREMITY VENOGRAPHY;  Surgeon: Waynetta Sandy, MD;  Location: [place] [facility];  Service: Cardiovascular;  Laterality: Left;  IVUS   LUMBAR FUSION  11/21/2000   L5-S1   LUMBAR SPINE SURGERY     x 2 others   PATCH ANGIOPLASTY Left 03/30/2019   Procedure: Patch Angioplasty of the Left Common Femoral Vein using Venosure Biologic patch;  Surgeon: Waynetta Sandy, MD;  Location: [place];  Service: Vascular;  Laterality: Left;   PERIPHERAL VASCULAR INTERVENTION Left 08/16/2019   Procedure: PERIPHERAL VASCULAR INTERVENTION;  Surgeon: Waynetta Sandy, MD;  Location: [place] [facility];  Service: Cardiovascular;  Laterality: Left;  common femoral/femoral vein stent   PERIPHERAL VASCULAR INTERVENTION Left 03/25/2022    Procedure: PERIPHERAL VASCULAR INTERVENTION;  Surgeon: Waynetta Sandy, MD;  Location: [place] [facility];  Service: Cardiovascular;  Laterality: Left;  COMMON FEMORAL VEIN   PERIPHERAL VASCULAR THROMBECTOMY Left 03/25/2022   Procedure: PERIPHERAL VASCULAR THROMBECTOMY;  Surgeon: Waynetta Sandy, MD;  Location: [place] [facility];  Service: Cardiovascular;  Laterality: Left;  EXTREMITY/IVC   TRIGGER FINGER RELEASE Right 12/01/2017   Procedure: RELEASE TRIGGER FINGER/A-1 PULLEY RIGHT THUMB;  Surgeon: Leanora Cover, MD;  Location: [place];  Service: Orthopedics;  Laterality: Right;   TRIGGER FINGER RELEASE Left 01/26/2018   Procedure: LEFT TRIGGER THUMB RELEASE;  Surgeon: Leanora Cover, MD;  Location: [place];  Service: Orthopedics;  Laterality: Left;   ULTRASOUND GUIDANCE FOR VASCULAR ACCESS Right 03/30/2019   Procedure:  Ultrasound-guided cannulation right internal jugular vein;  Surgeon: Waynetta Sandy, MD;  Location: [place];  Service: Vascular;  Laterality: Right;    Short Social History:  Social History   Tobacco Use   Smoking status: Never   Smokeless tobacco: Never  Substance Use Topics   Alcohol use: No    Allergies  Allergen Reactions   Aripiprazole Other (See Comments)    BECOMES  VIOLENT    Seroquel [Quetiapine Fumarate] Other (See Comments)    BECOMES VIOLENT   Chlorpromazine Other (See Comments)    SEVERE ANXIETY    Gabapentin Other (See Comments)    NIGHTMARES     Current Outpatient Medications  Medication Sig Dispense Refill   ALPRAZolam (XANAX) 1 MG tablet Take 1 tablet (1 mg total) by mouth 2 (two) times daily as needed. for anxiety 60 tablet 2   amitriptyline (ELAVIL) 50 MG tablet Take 1 tablet (50 mg total) by mouth at bedtime. Dosage increase 30 tablet 2   aspirin EC 81 MG tablet Take 81 mg by mouth daily.     aspirin-acetaminophen-caffeine (EXCEDRIN MIGRAINE) 250-250-65 MG tablet Take 2 tablets by  mouth every 6 (six) hours as needed for headache.     atorvastatin (LIPITOR) 40 MG tablet Take 1 tablet (40 mg total) by mouth daily. 90 tablet 1   cetirizine (ZYRTEC) 10 MG tablet TAKE 1 TABLET BY MOUTH DAILY 90 tablet 1   colchicine 0.6 MG tablet Take 1.'2mg'$  (2 tablets) then 0.'6mg'$  (1 tablet) 1 hour after. Then, take 1 tablet every day for 7 days. 10 tablet 2   FLUoxetine (PROZAC) 20 MG capsule Take 1 capsule (20 mg total) by mouth daily. Take with 40 mg to equal 60 mg daily 90 capsule 2   FLUoxetine (PROZAC) 40 MG capsule Take 1 capsule (40 mg total) by mouth daily. Take with 20 mg to equal 60 mg daily 90 capsule 2   GARLIC PO Take 1 tablet by mouth daily.     hydrochlorothiazide (HYDRODIURIL) 25 MG tablet TAKE 1 TABLET BY MOUTH DAILY Please schedule office visit BEFORE any future refills 90 tablet 1   HYDROcodone-acetaminophen (NORCO) 10-325 MG tablet Take 1 tablet by mouth every 6 (six) hours as needed. To last 30 days from fill date 120 tablet 0   [START ON 05/30/2022] HYDROcodone-acetaminophen (NORCO) 10-325 MG tablet Take 1 tablet by mouth every 6 (six) hours as needed. To last 30 days from fill date 120 tablet 0   levothyroxine (SYNTHROID) 125 MCG tablet Take 125 mcg by mouth daily before breakfast.     LINZESS 145 MCG CAPS capsule TAKE ONE CAPSULE BY MOUTH DAILY 90 capsule 3   lisinopril (ZESTRIL) 5 MG tablet Take 1 tablet (5 mg total) by mouth daily. 90 tablet 1   methocarbamol (ROBAXIN) 750 MG tablet Take 1 tablet (750 mg total) by mouth 2 (two) times daily as needed for muscle spasms. 60 tablet 5   Multiple Vitamin (MULTIVITAMIN) capsule Take 1 capsule by mouth daily.     Omega-3 Fatty Acids (OMEGA-3 FISH OIL PO) Take 1 capsule by mouth daily.     pantoprazole (PROTONIX) 40 MG tablet TAKE 1 TABLET BY MOUTH DAILY 90 tablet 3   potassium chloride (KLOR-CON M) 10 MEQ tablet TAKE 1 TABLET BY MOUTH THREE TIMES DAILY 90 tablet 1   pregabalin (LYRICA) 100 MG capsule TAKE ONE CAPSULE BY MOUTH  FOUR TIMES DAILY 120 capsule 1   rOPINIRole (REQUIP) 2 MG tablet TAKE 1 TABLET BY MOUTH  TWICE DAILY 180 tablet 3   Semaglutide,0.25 or 0.'5MG'$ /DOS, (OZEMPIC, 0.25 OR 0.5 MG/DOSE,) 2 MG/3ML SOPN Inject 0.5 mg into the skin once a week. 3 mL 0   Vibegron (GEMTESA) 75 MG TABS Take 75 mg by mouth daily. 30 tablet 11   warfarin (COUMADIN) 5 MG tablet TAKE 1 AND 1/2 TABLETS BY MOUTH ON TUEDAYS AND THURSDAYS, THEN TAKE 1 TABLET DAILY ON ALL OTHER DAYS (Patient taking differently: Take 2.5-5 mg by mouth See admin instructions. Take 5 mg daily except on Wednesdays take 2.5 mg dose) 60 tablet 3   No current facility-administered medications for this visit.    Review of Systems  Constitutional:  Constitutional negative. HENT: HENT negative.  Eyes: Eyes negative.  Respiratory: Respiratory negative.  Cardiovascular: Positive for leg swelling.  GI: Gastrointestinal negative.  Musculoskeletal: Positive for leg pain.  Neurological: Neurological negative. Hematologic: Hematologic/lymphatic negative.  Psychiatric: Psychiatric negative.        Objective:  Objective   Vitals:   05/08/22 0936  BP: 138/69  Pulse: 67  Resp: 20  Temp: 98.2 F (36.8 C)  SpO2: 97%  Weight: 250 lb (113.4 kg)  Height: '5\' 10"'$  (1.778 m)   Body mass index is 35.87 kg/m.  Physical Exam HENT:     Head: Normocephalic.     Nose: Nose normal.  Eyes:     Pupils: Pupils are equal, round, and reactive to light.  Cardiovascular:     Rate and Rhythm: Normal rate.     Pulses: Normal pulses.  Pulmonary:     Effort: Pulmonary effort is normal.  Abdominal:     General: Abdomen is flat.     Palpations: Abdomen is soft.  Musculoskeletal:        General: Normal range of motion.     Right lower leg: No edema.     Left lower leg: Edema present.     Comments: RLE 38cm, LLE 41cm  Skin:    Capillary Refill: Capillary refill takes less than 2 seconds.  Neurological:     General: No focal deficit present.     Mental Status:  She is alert.  Psychiatric:        Mood and Affect: Mood normal.        Behavior: Behavior normal.        Thought Content: Thought content normal.     Data: IVC/Iliac Findings:  +----------+------+--------+---------------------+     IVC    PatentThrombus      Comments         +----------+------+--------+---------------------+  IVC Prox  patent        Limited visualization  +----------+------+--------+---------------------+  IVC Mid   patent                               +----------+------+--------+---------------------+  IVC Distalpatent                               +----------+------+--------+---------------------+      +----------------+---------+-----------+---------+-----------+-------------  ---+        CIV       RT-PatentRT-ThrombusLT-PatentLT-Thrombus    Comments       +----------------+---------+-----------+---------+-----------+-------------  ---+  Common Iliac  unable to       Prox                                                       visualize       +----------------+---------+-----------+---------+-----------+-------------  ---+  Common Iliac Mid                                           unable to                                                                  visualize       +----------------+---------+-----------+---------+-----------+-------------  ---+  Common Iliac     patent                                    Left  stent     Distal                                                  appears  occluded  +----------------+---------+-----------+---------+-----------+-------------  ---+       +-----------------+---------+-----------+---------+-----------+------------  ---+         EIV       RT-PatentRT-ThrombusLT-PatentLT-Thrombus   Comments       +-----------------+---------+-----------+---------+-----------+------------  ---+   External Iliac    patent                                   Left  stent     Vein Prox                                                    appears                                                                   occluded       +-----------------+---------+-----------+---------+-----------+------------  ---+  External Iliac    patent                                   Left  stent     Vein Mid  appears                                                                   occluded       +-----------------+---------+-----------+---------+-----------+------------  ---+  External Iliac    patent                                   Left  stent     Vein Distal                                                  appears                                                                   occluded       +-----------------+---------+-----------+---------+-----------+------------  ---+          Summary:  IVC/Iliac: Visualization of proximal Inferior Vena Cava, mid inferior vena  cava, distal Inferior Vena Cava and proximal common Iliac was limited.    - The left common iliac, external iliac, and common femoral vein stents  appear occluded.   - IVC appears patent, however, visualization was limited. Visualized  segments of the right common and external iliac veins appear patent.         Assessment/Plan:    56 year old female with extensive venous history as noted above.  Unfortunately her stents have again occluded.  I discussed the options being continued Coumadin therapy and compression stockings and we have ordered compression stockings for her today to be picked up at [place] or she can purchase them here if necessary.  We have discussed proceeding with venogram but given that this has quickly failed we will instead get CT venogram and I will see her back in a few weeks.   Possibly if her stents remain occluded and her leg does not have significant swelling we can just continue compression therapy and Coumadin with continued weight loss her symptoms may be manageable without any further intervention.  Obviously if her symptoms do worsen or her leg swells up and over 45 cm as previous evaluation we would probably need repeat thrombectomy with possible stenting but a CT prior would be nice to have to evaluate exactly what is going on.  She demonstrates good understanding is obviously frustrated which is certainly understandable.     Waynetta Sandy MD [facility]

## 2022-05-09 ENCOUNTER — Ambulatory Visit (HOSPITAL_COMMUNITY): Payer: Medicaid Other | Attending: Orthopedic Surgery | Admitting: Physical Therapy

## 2022-05-09 ENCOUNTER — Encounter (HOSPITAL_COMMUNITY): Payer: Self-pay | Admitting: Physical Therapy

## 2022-05-09 DIAGNOSIS — M5459 Other low back pain: Secondary | ICD-10-CM | POA: Insufficient documentation

## 2022-05-09 DIAGNOSIS — M79672 Pain in left foot: Secondary | ICD-10-CM | POA: Diagnosis not present

## 2022-05-09 DIAGNOSIS — M25572 Pain in left ankle and joints of left foot: Secondary | ICD-10-CM | POA: Diagnosis not present

## 2022-05-09 DIAGNOSIS — R262 Difficulty in walking, not elsewhere classified: Secondary | ICD-10-CM | POA: Diagnosis not present

## 2022-05-09 DIAGNOSIS — M25562 Pain in left knee: Secondary | ICD-10-CM | POA: Diagnosis not present

## 2022-05-09 DIAGNOSIS — G8929 Other chronic pain: Secondary | ICD-10-CM | POA: Diagnosis not present

## 2022-05-09 DIAGNOSIS — M25561 Pain in right knee: Secondary | ICD-10-CM | POA: Insufficient documentation

## 2022-05-09 NOTE — Therapy (Signed)
OUTPATIENT PHYSICAL THERAPY LOWER EXTREMITY TREATMENT   Patient Name: Jane [name] MRN: [id number] DOB:Jun 14, 1966, 56 y.o., female Today's Date: 05/09/2022   PT End of Session - 05/09/22 1118     Visit Number 5    Number of Visits 12    Date for PT Re-Evaluation 05/27/22    Authorization Type [place] [employer] Healthy*    Authorization Time Period approved 7 visits from 6/12-->06/13/22    Authorization - Visit Number 5    Authorization - Number of Visits 7    Progress Note Due on Visit 7    PT Start Time 1423    PT Stop Time 1156    PT Time Calculation (min) 38 min    Activity Tolerance Patient limited by pain;Patient tolerated treatment well    Behavior During Therapy WFL for tasks assessed/performed              Past Medical History:  Diagnosis Date   ADD (attention deficit disorder)    Anxiety    Back pain    Bilateral swelling of feet    Bipolar 1 disorder (HCC)    Bipolar 1 disorder (HCC)    Bipolar disorder (HCC)    Chewing difficulty    Chronic fatigue syndrome    Chronic kidney disease    Stage 3 kidney disease;dx by Dr. Sinda Du.    Constipation    Depression    Diabetes mellitus without complication (HCC)    diet controlled   Dyspnea    with exertion   GERD (gastroesophageal reflux disease)    Headache    migraines   High cholesterol    History of blood clots    History of DVT (deep vein thrombosis)    left leg   Hypertension    states under control with meds., has been on med. x 2 years   Hypothyroidism    Joint pain    Neuropathy    Obsessive-compulsive disorder    Peripheral vascular disease ([place])    Prediabetes    Respiratory failure requiring intubation ([place])    Restless leg syndrome    Shortness of breath    Sleep apnea    Thrombocytopenia ([place]) 09/15/2019   Trigger thumb of left hand 01/2018   Trigger thumb of right hand    Past Surgical History:  Procedure Laterality Date   ABDOMINAL HYSTERECTOMY  06/2016   complete    APPLICATION OF WOUND VAC Left 04/27/2019   Procedure: APPLICATION OF WOUND VAC LEFT GROIN;  Surgeon: Waynetta Sandy, MD;  Location: [facility];  Service: Vascular;  Laterality: Left;   AV FISTULA PLACEMENT Left 11/24/2018   Procedure: ARTERIOVENOUS (AV) FISTULA CREATION LEFT SFA TO LEFT FEMORAL VEIN;  Surgeon: Waynetta Sandy, MD;  Location: [facility];  Service: Vascular;  Laterality: Left;   BACK SURGERY     CHOLECYSTECTOMY     COLONOSCOPY WITH PROPOFOL N/A 11/22/2019   Procedure: COLONOSCOPY WITH PROPOFOL;  Surgeon: [name], MD;  Location: [facility];  Service: Gastroenterology;  Laterality: N/A;   FEMORAL ARTERY EXPLORATION  03/30/2019   Procedure: Left Common Femoral Artery and Vein Exploration;  Surgeon: Waynetta Sandy, MD;  Location: [place];  Service: Vascular;;   FEMORAL-FEMORAL BYPASS GRAFT Left 11/24/2018   Procedure: BYPASS GRAFT FEMORAL-FEMORAL VENOUS LEFT TO RIGHT PALMA PROCEDURE USING CRYOVEIN;  Surgeon: Waynetta Sandy, MD;  Location: [place];  Service: Vascular;  Laterality: Left;   GROIN DEBRIDEMENT Left 04/27/2019   Procedure: [name] DEBRIDEMENT;  Surgeon: Donzetta Matters,  Georgia Dom, MD;  Location: [place];  Service: Vascular;  Laterality: Left;   INSERTION OF ILIAC STENT  03/30/2019   Procedure: Stent of left common, external iliac veins and left common femoral vein;  Surgeon: Waynetta Sandy, MD;  Location: [place];  Service: Vascular;;   KNEE ARTHROSCOPY WITH MENISCAL REPAIR Left 11/14/2020   Procedure: LEFT KNEE ARTHROSCOPY WITH PARTIAL MEDIAL MENISCECTOMY;  Surgeon: Carole Civil, MD;  Location: [facility];  Service: Orthopedics;  Laterality: Left;   LOWER EXTREMITY VENOGRAPHY N/A 08/17/2018   Procedure: LOWER EXTREMITY VENOGRAPHY - Central Venogram;  Surgeon: Waynetta Sandy, MD;  Location: [place] [facility];  Service: Cardiovascular;  Laterality: N/A;   LOWER EXTREMITY VENOGRAPHY Bilateral 03/09/2019   Procedure: LOWER  EXTREMITY VENOGRAPHY;  Surgeon: Waynetta Sandy, MD;  Location: [place] [facility];  Service: Cardiovascular;  Laterality: Bilateral;   LOWER EXTREMITY VENOGRAPHY Left 08/16/2019   Procedure: LOWER EXTREMITY VENOGRAPHY;  Surgeon: Waynetta Sandy, MD;  Location: [facility];  Service: Cardiovascular;  Laterality: Left;   LOWER EXTREMITY VENOGRAPHY Left 03/25/2022   Procedure: LOWER EXTREMITY VENOGRAPHY;  Surgeon: Waynetta Sandy, MD;  Location: [place] [facility];  Service: Cardiovascular;  Laterality: Left;  IVUS   LUMBAR FUSION  11/21/2000   L5-S1   LUMBAR SPINE SURGERY     x 2 others   PATCH ANGIOPLASTY Left 03/30/2019   Procedure: Patch Angioplasty of the Left Common Femoral Vein using Venosure Biologic patch;  Surgeon: Waynetta Sandy, MD;  Location: [place];  Service: Vascular;  Laterality: Left;   PERIPHERAL VASCULAR INTERVENTION Left 08/16/2019   Procedure: PERIPHERAL VASCULAR INTERVENTION;  Surgeon: Waynetta Sandy, MD;  Location: [place] [facility];  Service: Cardiovascular;  Laterality: Left;  common femoral/femoral vein stent   PERIPHERAL VASCULAR INTERVENTION Left 03/25/2022   Procedure: PERIPHERAL VASCULAR INTERVENTION;  Surgeon: Waynetta Sandy, MD;  Location: [place] [facility];  Service: Cardiovascular;  Laterality: Left;  COMMON FEMORAL VEIN   PERIPHERAL VASCULAR THROMBECTOMY Left 03/25/2022   Procedure: PERIPHERAL VASCULAR THROMBECTOMY;  Surgeon: Waynetta Sandy, MD;  Location: [place] [facility];  Service: Cardiovascular;  Laterality: Left;  EXTREMITY/IVC   TRIGGER FINGER RELEASE Right 12/01/2017   Procedure: RELEASE TRIGGER FINGER/A-1 PULLEY RIGHT THUMB;  Surgeon: Leanora Cover, MD;  Location: [place];  Service: Orthopedics;  Laterality: Right;   TRIGGER FINGER RELEASE Left 01/26/2018   Procedure: LEFT TRIGGER THUMB RELEASE;  Surgeon: Leanora Cover, MD;  Location: [place];   Service: Orthopedics;  Laterality: Left;   ULTRASOUND GUIDANCE FOR VASCULAR ACCESS Right 03/30/2019   Procedure: Ultrasound-guided cannulation right internal jugular vein;  Surgeon: Waynetta Sandy, MD;  Location: [facility];  Service: Vascular;  Laterality: Right;   Patient Active Problem List   Diagnosis Date Noted   Chronic pain of both knees 10/22/2021   Hematuria 10/01/2021   Recurrent UTI 10/01/2021   Dyspnea on exertion 10/01/2021   Iron deficiency anemia 04/18/2021   Nocturnal sleep-related eating disorder 01/01/2021   Vitamin D deficiency 01/01/2021   Arthritis of left knee 12/26/2020   S/P left knee arthroscopy 11/14/20  11/21/2020   Old complex tear of medial meniscus of left knee    Primary osteoarthritis of left knee 11/09/2020   Subacromial bursitis of right shoulder joint 11/09/2020   Chronic pain syndrome 04/24/2020   Pharmacologic therapy 04/24/2020   Disorder of skeletal system 04/24/2020   Problems influencing health status 04/24/2020  History of thrombocytopenia 04/24/2020   Abnormal MRI, cervical spine (01/08/2017) 04/24/2020   Abnormal MRI, lumbar spine (05/11/2014) 04/24/2020   DDD (degenerative disc disease), cervical 04/24/2020   DDD (degenerative disc disease), lumbar 04/24/2020   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   Diabetic peripheral neuropathy ([place]) 04/24/2020   Neurogenic pain 04/24/2020   Chronic musculoskeletal pain 04/24/2020   Other proteinuria 03/30/2020   Myalgia 03/15/2020   Bilateral hand swelling 02/16/2020   Thrush 01/20/2020   Morbid obesity ([place]) 10/25/2019   History of back surgery 10/25/2019   Peripheral neuropathy 10/25/2019   Restless leg syndrome 10/25/2019   Chronic low back pain (Bilateral) w/ sciatica (Bilateral) 10/25/2019   Hypertension associated with diabetes ([place]) 10/25/2019   Type 2 diabetes mellitus with stage 3 chronic kidney disease, without long-term current use of insulin ([place]) 10/25/2019   Chronic  anticoagulation (Coumadin) 10/25/2019   Hyperlipidemia associated with type 2 diabetes mellitus ([place]) 10/25/2019   CKD stage 3 due to type 2 diabetes mellitus ([place]) 10/25/2019   GERD (gastroesophageal reflux disease) 10/25/2019   Allergic rhinitis 10/25/2019   Chronic constipation 10/25/2019   Hypothyroidism    Normocytic anemia 09/15/2019   Iliac vein stenosis, 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   Chronic venous insufficiency 11/21/2017   Displacement of lumbar intervertebral disc without myelopathy 07/14/2017   Radicular low back pain 05/25/2014   Depression, major, recurrent ([place]) 06/17/2013   OCD (obsessive compulsive disorder) 11/20/2012   Insomnia due to mental disorder 09/25/2012   Bipolar 1 disorder ([place]) 09/25/2012   History of DVT (deep vein thrombosis) 08/18/2012    PCP: Lavon Paganini MD  REFERRING PROVIDER:  Kurtis Bushman, MD  Apt: MD Donzetta Matters 05/08/22  REFERRING DIAG: PT eval/tx for M17.0 bilateral osteoarthritis of knees  THERAPY DIAG:  Chronic pain of both knees  Other low back pain  Rationale for Evaluation and Treatment Rehabilitation  ONSET DATE: Chronic   SUBJECTIVE:   SUBJECTIVE STATEMENT: Patient had follow up with vascular MD yesterday and was found to have occlusion in previously placed stents in LEs. No appearance restriction at this time. She was instructed to continue with coumadin and compression stocking to reduced swelling and may need further intervention if swelling persists.    PERTINENT HISTORY: Lumbar surgery x 3, LT knee surgery, hx of blood clot  PAIN:  Are you having pain? Yes: NPRS scale: 8/10 Pain location: Low back, LT knee  Pain description: sharp, constant, aching   Aggravating factors: "everything"  Relieving factors: sitting, laying down, leaning   PRECAUTIONS: None  WEIGHT BEARING RESTRICTIONS No  FALLS:  Has patient fallen in last 6 months? No  LIVING ENVIRONMENT: Lives with: lives with their family  and lives alone Lives in: House/apartment Stairs: Yes: External: 3 steps; on right going up Has following equipment at home: Single point cane  OCCUPATION: Disability  PLOF: Independent with basic ADLs  PATIENT GOALS To avoid surgery    OBJECTIVE:   DIAGNOSTIC FINDINGS: Impression grade 1/2 arthritis left knee Impression normal right knee IMPRESSION: 1. Postsurgical changes at L5-S1. 2. Progression of degenerative changes most notable at L4-L5.    PATIENT SURVEYS:  FOTO 47% function   COGNITION:  Overall cognitive status: Within functional limits for tasks assessed     Lumbar AROM:  Flexion: 60% limited Extension: 100% limited RT sidebend: 50% limited LT sidebend: 50% limited    LOWER EXTREMITY ROM: (unable to lay prone for hip extension testing)   Active ROM Right Eval 04/15/22 Left Eval  04/15/22 Right  05/01/22 Left 05/01/22  Hip flexion      Hip extension      Hip abduction      Hip adduction      Hip internal rotation      Hip external rotation      Knee flexion 110 110 130 125  Knee extension -6 -7 Lacking 3 Lacking 4  Ankle dorsiflexion      Ankle plantarflexion      Ankle inversion      Ankle eversion       (Blank rows = not tested)  LOWER EXTREMITY MMT:  MMT Right Eval 04/15/22 Left Eval 04/15/22  Hip flexion 5 5  Hip extension Unable  Unable   Hip abduction 4+ 4+  Hip adduction    Hip internal rotation    Hip external rotation    Knee flexion    Knee extension 5 5  Ankle dorsiflexion 5 5  Ankle plantarflexion    Ankle inversion    Ankle eversion     (Blank rows = not tested)    FUNCTIONAL TESTS:  30 seconds chair stand test: 13 reps, hands on thighs   GAIT:  Decreased stride, antalgic, no AD    TODAY'S TREATMENT: 05/09/22  Ab brace x10 Bridge Ab march SLR Sidelying hip abduction x 10 each   Calf stretch at step 2 x 30" Heel raise 2 x 10  Toe raise 2 x 10  FWD step up 4 inch x 15 each   Leg press 3 plates 3 x 10 HS  curl machine 5 plates 3 K55     Nustep 6 min EOS lv 5 (seat 8)    05/03/22: Supine: Bridge 10x Quad set prior SLR floating with ab set 10x SKTC 2x 30" LTR 5x 10"  Prone: Prone x 2 min POE x 26mn Prone heel squeeze 10 x5"   Sidelying: Abd 10x BLE  Standing: STS 10x eccentric control no HHA Standing extension 10x 5" TKE with RTB 10x5  05/01/22: Homans DVT testing: no redness, heat, or pain with DF and gastroc squeeze Patella mobs all  Measurements for compression garments  Supine: SAQ 10x Bridge Heel slide: 10x AROM Lt kee: lacking 4-125 degrees AROM Rt knee lacking 3-130 degrees Bent knee raise with ab set 10x 5" LTR 5x 10"  Sidelying clam with RTB 10x 5"BLE  04/17/22: Supine: Diaphragmatic breathing x 1 min Ab bracing with exhale 3-5" holds x 2 min Bent knee raise with ab set 10x 5" Clam with RTB 10x 5" for stability Bridge 10x 5" Quad sets 10x 5" BLE  Seated: seated posture  Wback 10x   04/15/22 Bridge Ab brace    PATIENT EDUCATION:  Education details: 04/17/22:  Reviewed goals, educated importance of HEP compliance with HEP compliance for maximal benefits, educated benefits with compression garments for edema control and hx of blood clot, educated benefits with posture for pain control. EvaL: on evaluation findings, POC and HEP Person educated: Patient Education method: Explanation Education comprehension: verbalized understanding   HOME EXERCISE PROGRAM: Access Code: [email or web address] URL: [email or web address] Date: 04/15/2022 Prepared by: CJosue Hector Exercises - Supine Transversus Abdominis Bracing - Hands on Stomach  - 2-3 x daily - 7 x weekly - 2 sets - 10 reps - 5 second hold - Supine Bridge  - 2-3 x daily - 7 x weekly - 2 sets - 10 reps - 5 second hold  04/17/22: Quad sets, Bent knee raise, clam with RTB 05/01/22: LTR 05/03/22:  Prone on elbow x 2 min, heel squeeze.  ASSESSMENT:  CLINICAL IMPRESSION: Patient tolerated session  well today. Able to progress LE strengthening with added machines. Patient educated on purpose an function of added machine exercise for strengthening targeted muscle groups. Patient will continue to benefit from skilled therapy services to reduce remaining deficits and improve functional ability.    OBJECTIVE IMPAIRMENTS Abnormal gait, decreased activity tolerance, decreased ROM, increased fascial restrictions, impaired flexibility, improper body mechanics, and pain.   ACTIVITY LIMITATIONS lifting, bending, standing, squatting, stairs, and locomotion level  PARTICIPATION LIMITATIONS: meal prep, cleaning, laundry, shopping, community activity, and yard work  PERSONAL FACTORS 3+ comorbidities: arthritis, multiple body regions, chronicity  are also affecting patient's functional outcome.   REHAB POTENTIAL: Fair see above  CLINICAL DECISION MAKING: Stable/uncomplicated  EVALUATION COMPLEXITY: Low   GOALS: SHORT TERM GOALS: Target date: 05/06/2022  Patient will be independent with initial HEP and self-management strategies to improve functional outcomes Baseline:  Goal status: Ongoing  LONG TERM GOALS: Target date: 05/27/2022  Patient will be independent with advanced HEP and self-management strategies to improve functional outcomes Baseline:  Goal status: Ongoing  2.  Patient will improve FOTO score to predicted value to indicate improvement in functional outcomes Baseline: 47% function Goal status: Ongoing  3.  Patient will report reduction of back and knee pain to <4/10 at rest for improved quality of life and ability to perform ADLs  Baseline: 9/10 Goal status: Ongoing  4. Patient will be able to complete at least 20 reps during 30 second sit to stand to demo improved functional strength and ability to perform ADLs.  Baseline: 13 reps Goal status: Ongoing  PLAN: PT FREQUENCY: 2x/week  PT DURATION: 6 weeks  PLANNED INTERVENTIONS: Therapeutic exercises, Therapeutic  activity, Neuromuscular re-education, Balance training, Gait training, Patient/Family education, Joint manipulation, Joint mobilization, Stair training, Aquatic Therapy, Dry Needling, Electrical stimulation, Spinal manipulation, Spinal mobilization, Cryotherapy, Moist heat, scar mobilization, Taping, Traction, Ultrasound, Biofeedback, Ionotophoresis '4mg'$ /ml Dexamethasone, and Manual therapy.   PLAN FOR NEXT SESSION: Progress core, glute and quad strengthening as tolerated. Knee and lumbar mobility progression as able. F/u with knee and compression garments  11:19 AM, 05/09/22 Josue Hector PT DPT  Physical Therapist with [facility]  [phone]

## 2022-05-10 DIAGNOSIS — F3132 Bipolar disorder, current episode depressed, moderate: Secondary | ICD-10-CM | POA: Diagnosis not present

## 2022-05-11 DIAGNOSIS — F3132 Bipolar disorder, current episode depressed, moderate: Secondary | ICD-10-CM | POA: Diagnosis not present

## 2022-05-14 ENCOUNTER — Ambulatory Visit (HOSPITAL_COMMUNITY): Payer: Medicaid Other | Admitting: Physical Therapy

## 2022-05-14 ENCOUNTER — Telehealth: Payer: Self-pay

## 2022-05-14 DIAGNOSIS — M25561 Pain in right knee: Secondary | ICD-10-CM | POA: Diagnosis not present

## 2022-05-14 DIAGNOSIS — M79672 Pain in left foot: Secondary | ICD-10-CM | POA: Diagnosis not present

## 2022-05-14 DIAGNOSIS — G8929 Other chronic pain: Secondary | ICD-10-CM

## 2022-05-14 DIAGNOSIS — M25572 Pain in left ankle and joints of left foot: Secondary | ICD-10-CM | POA: Diagnosis not present

## 2022-05-14 DIAGNOSIS — R262 Difficulty in walking, not elsewhere classified: Secondary | ICD-10-CM

## 2022-05-14 DIAGNOSIS — M5459 Other low back pain: Secondary | ICD-10-CM

## 2022-05-14 DIAGNOSIS — M25562 Pain in left knee: Secondary | ICD-10-CM | POA: Diagnosis not present

## 2022-05-14 NOTE — Therapy (Signed)
OUTPATIENT PHYSICAL THERAPY LOWER EXTREMITY TREATMENT   Patient Name: Jane [name] MRN: [id number] DOB:06/10/1966, 56 y.o., female Today's Date: 05/14/2022   PT End of Session - 05/14/22 0858     Visit Number 6    Number of Visits 12    Date for PT Re-Evaluation 05/27/22    Authorization Type  Medicaid Healthy*    Authorization Time Period approved 7 visits from 6/12-->06/13/22    Authorization - Visit Number 6    Authorization - Number of Visits 7    Progress Note Due on Visit 7    PT Start Time 0901    PT Stop Time 0940    PT Time Calculation (min) 39 min    Activity Tolerance Patient tolerated treatment well    Behavior During Therapy WFL for tasks assessed/performed              Past Medical History:  Diagnosis Date   ADD (attention deficit disorder)    Anxiety    Back pain    Bilateral swelling of feet    Bipolar 1 disorder (HCC)    Bipolar 1 disorder (HCC)    Bipolar disorder (HCC)    Chewing difficulty    Chronic fatigue syndrome    Chronic kidney disease    Stage 3 kidney disease;dx by Dr. Sinda Du.    Constipation    Depression    Diabetes mellitus without complication (HCC)    diet controlled   Dyspnea    with exertion   GERD (gastroesophageal reflux disease)    Headache    migraines   High cholesterol    History of blood clots    History of DVT (deep vein thrombosis)    left leg   Hypertension    states under control with meds., has been on med. x 2 years   Hypothyroidism    Joint pain    Neuropathy    Obsessive-compulsive disorder    Peripheral vascular disease ([place])    Prediabetes    Respiratory failure requiring intubation ([place])    Restless leg syndrome    Shortness of breath    Sleep apnea    Thrombocytopenia ([place]) 09/15/2019   Trigger thumb of left hand 01/2018   Trigger thumb of right hand    Past Surgical History:  Procedure Laterality Date   ABDOMINAL HYSTERECTOMY  06/2016   complete   APPLICATION OF WOUND VAC  Left 04/27/2019   Procedure: APPLICATION OF WOUND VAC LEFT GROIN;  Surgeon: Waynetta Sandy, MD;  Location: [place];  Service: Vascular;  Laterality: Left;   AV FISTULA PLACEMENT Left 11/24/2018   Procedure: ARTERIOVENOUS (AV) FISTULA CREATION LEFT SFA TO LEFT FEMORAL VEIN;  Surgeon: Waynetta Sandy, MD;  Location: [place];  Service: Vascular;  Laterality: Left;   BACK SURGERY     CHOLECYSTECTOMY     COLONOSCOPY WITH PROPOFOL N/A 11/22/2019   Procedure: COLONOSCOPY WITH PROPOFOL;  Surgeon: [name], MD;  Location: [facility];  Service: Gastroenterology;  Laterality: N/A;   FEMORAL ARTERY EXPLORATION  03/30/2019   Procedure: Left Common Femoral Artery and Vein Exploration;  Surgeon: Waynetta Sandy, MD;  Location: [place];  Service: Vascular;;   FEMORAL-FEMORAL BYPASS GRAFT Left 11/24/2018   Procedure: BYPASS GRAFT FEMORAL-FEMORAL VENOUS LEFT TO RIGHT PALMA PROCEDURE USING CRYOVEIN;  Surgeon: Waynetta Sandy, MD;  Location: [place];  Service: Vascular;  Laterality: Left;   GROIN DEBRIDEMENT Left 04/27/2019   Procedure: [name] DEBRIDEMENT;  Surgeon: Waynetta Sandy, MD;  Location: [facility];  Service: Vascular;  Laterality: Left;   INSERTION OF ILIAC STENT  03/30/2019   Procedure: Stent of left common, external iliac veins and left common femoral vein;  Surgeon: Waynetta Sandy, MD;  Location: [place];  Service: Vascular;;   KNEE ARTHROSCOPY WITH MENISCAL REPAIR Left 11/14/2020   Procedure: LEFT KNEE ARTHROSCOPY WITH PARTIAL MEDIAL MENISCECTOMY;  Surgeon: Carole Civil, MD;  Location: [facility];  Service: Orthopedics;  Laterality: Left;   LOWER EXTREMITY VENOGRAPHY N/A 08/17/2018   Procedure: LOWER EXTREMITY VENOGRAPHY - Central Venogram;  Surgeon: Waynetta Sandy, MD;  Location: [place] [facility];  Service: Cardiovascular;  Laterality: N/A;   LOWER EXTREMITY VENOGRAPHY Bilateral 03/09/2019   Procedure: LOWER EXTREMITY VENOGRAPHY;  Surgeon:  Waynetta Sandy, MD;  Location: [place] [facility];  Service: Cardiovascular;  Laterality: Bilateral;   LOWER EXTREMITY VENOGRAPHY Left 08/16/2019   Procedure: LOWER EXTREMITY VENOGRAPHY;  Surgeon: Waynetta Sandy, MD;  Location: [place] [facility];  Service: Cardiovascular;  Laterality: Left;   LOWER EXTREMITY VENOGRAPHY Left 03/25/2022   Procedure: LOWER EXTREMITY VENOGRAPHY;  Surgeon: Waynetta Sandy, MD;  Location: [place] [facility];  Service: Cardiovascular;  Laterality: Left;  IVUS   LUMBAR FUSION  11/21/2000   L5-S1   LUMBAR SPINE SURGERY     x 2 others   PATCH ANGIOPLASTY Left 03/30/2019   Procedure: Patch Angioplasty of the Left Common Femoral Vein using Venosure Biologic patch;  Surgeon: Waynetta Sandy, MD;  Location: [place];  Service: Vascular;  Laterality: Left;   PERIPHERAL VASCULAR INTERVENTION Left 08/16/2019   Procedure: PERIPHERAL VASCULAR INTERVENTION;  Surgeon: Waynetta Sandy, MD;  Location: [place] [facility];  Service: Cardiovascular;  Laterality: Left;  common femoral/femoral vein stent   PERIPHERAL VASCULAR INTERVENTION Left 03/25/2022   Procedure: PERIPHERAL VASCULAR INTERVENTION;  Surgeon: Waynetta Sandy, MD;  Location: [place] [facility];  Service: Cardiovascular;  Laterality: Left;  COMMON FEMORAL VEIN   PERIPHERAL VASCULAR THROMBECTOMY Left 03/25/2022   Procedure: PERIPHERAL VASCULAR THROMBECTOMY;  Surgeon: Waynetta Sandy, MD;  Location: [place] [facility];  Service: Cardiovascular;  Laterality: Left;  EXTREMITY/IVC   TRIGGER FINGER RELEASE Right 12/01/2017   Procedure: RELEASE TRIGGER FINGER/A-1 PULLEY RIGHT THUMB;  Surgeon: Leanora Cover, MD;  Location: [place];  Service: Orthopedics;  Laterality: Right;   TRIGGER FINGER RELEASE Left 01/26/2018   Procedure: LEFT TRIGGER THUMB RELEASE;  Surgeon: Leanora Cover, MD;  Location: [place];  Service: Orthopedics;   Laterality: Left;   ULTRASOUND GUIDANCE FOR VASCULAR ACCESS Right 03/30/2019   Procedure: Ultrasound-guided cannulation right internal jugular vein;  Surgeon: Waynetta Sandy, MD;  Location: [place];  Service: Vascular;  Laterality: Right;   Patient Active Problem List   Diagnosis Date Noted   Chronic pain of both knees 10/22/2021   Hematuria 10/01/2021   Recurrent UTI 10/01/2021   Dyspnea on exertion 10/01/2021   Iron deficiency anemia 04/18/2021   Nocturnal sleep-related eating disorder 01/01/2021   Vitamin D deficiency 01/01/2021   Arthritis of left knee 12/26/2020   S/P left knee arthroscopy 11/14/20  11/21/2020   Old complex tear of medial meniscus of left knee    Primary osteoarthritis of left knee 11/09/2020   Subacromial bursitis of right shoulder joint 11/09/2020   Chronic pain syndrome 04/24/2020   Pharmacologic therapy 04/24/2020   Disorder of skeletal system 04/24/2020   Problems influencing health status 04/24/2020   History of thrombocytopenia 04/24/2020  Abnormal MRI, cervical spine (01/08/2017) 04/24/2020   Abnormal MRI, lumbar spine (05/11/2014) 04/24/2020   DDD (degenerative disc disease), cervical 04/24/2020   DDD (degenerative disc disease), lumbar 04/24/2020   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   Diabetic peripheral neuropathy ([place]) 04/24/2020   Neurogenic pain 04/24/2020   Chronic musculoskeletal pain 04/24/2020   Other proteinuria 03/30/2020   Myalgia 03/15/2020   Bilateral hand swelling 02/16/2020   Thrush 01/20/2020   Morbid obesity ([place]) 10/25/2019   History of back surgery 10/25/2019   Peripheral neuropathy 10/25/2019   Restless leg syndrome 10/25/2019   Chronic low back pain (Bilateral) w/ sciatica (Bilateral) 10/25/2019   Hypertension associated with diabetes ([place]) 10/25/2019   Type 2 diabetes mellitus with stage 3 chronic kidney disease, without long-term current use of insulin ([place]) 10/25/2019   Chronic anticoagulation (Coumadin)  10/25/2019   Hyperlipidemia associated with type 2 diabetes mellitus ([place]) 10/25/2019   CKD stage 3 due to type 2 diabetes mellitus ([place]) 10/25/2019   GERD (gastroesophageal reflux disease) 10/25/2019   Allergic rhinitis 10/25/2019   Chronic constipation 10/25/2019   Hypothyroidism    Normocytic anemia 09/15/2019   Iliac vein stenosis, 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   Chronic venous insufficiency 11/21/2017   Displacement of lumbar intervertebral disc without myelopathy 07/14/2017   Radicular low back pain 05/25/2014   Depression, major, recurrent ([place]) 06/17/2013   OCD (obsessive compulsive disorder) 11/20/2012   Insomnia due to mental disorder 09/25/2012   Bipolar 1 disorder ([place]) 09/25/2012   History of DVT (deep vein thrombosis) 08/18/2012    PCP: Lavon Paganini MD  REFERRING PROVIDER:  Kurtis Bushman, MD  Apt: MD Donzetta Matters 05/08/22  REFERRING DIAG: PT eval/tx for M17.0 bilateral osteoarthritis of knees  THERAPY DIAG:  Chronic pain of both knees  Other low back pain  Difficulty in walking, not elsewhere classified  Pain in left foot  Rationale for Evaluation and Treatment Rehabilitation  ONSET DATE: Chronic   SUBJECTIVE:   SUBJECTIVE STATEMENT: Patient states she got her compression socks yesterday and they are uncomfortable. She is trying to wear them as reccommended but they are very tight. She feels therapy in the morning is good as it warms her up for the rest of the day.    PERTINENT HISTORY: Lumbar surgery x 3, LT knee surgery, hx of blood clot  PAIN:  Are you having pain? Yes: NPRS scale: 7/10 Pain location: Low back, LT knee  Pain description: stiff, pressure  Aggravating factors: "everything"  Relieving factors: sitting, laying down, leaning   PRECAUTIONS: None  WEIGHT BEARING RESTRICTIONS No  FALLS:  Has patient fallen in last 6 months? No  LIVING ENVIRONMENT: Lives with: lives with their family and lives alone Lives in:  House/apartment Stairs: Yes: External: 3 steps; on right going up Has following equipment at home: Single point cane  OCCUPATION: Disability  PLOF: Independent with basic ADLs  PATIENT GOALS To avoid surgery    OBJECTIVE:   DIAGNOSTIC FINDINGS: Impression grade 1/2 arthritis left knee Impression normal right knee IMPRESSION: 1. Postsurgical changes at L5-S1. 2. Progression of degenerative changes most notable at L4-L5.    PATIENT SURVEYS:  FOTO 47% function   COGNITION:  Overall cognitive status: Within functional limits for tasks assessed     Lumbar AROM:  Flexion: 60% limited Extension: 100% limited RT sidebend: 50% limited LT sidebend: 50% limited    LOWER EXTREMITY ROM: (unable to lay prone for hip extension testing)   Active ROM Right Eval 04/15/22 Left  Eval 04/15/22 Right  05/01/22 Left 05/01/22  Hip flexion      Hip extension      Hip abduction      Hip adduction      Hip internal rotation      Hip external rotation      Knee flexion 110 110 130 125  Knee extension -6 -7 Lacking 3 Lacking 4  Ankle dorsiflexion      Ankle plantarflexion      Ankle inversion      Ankle eversion       (Blank rows = not tested)  LOWER EXTREMITY MMT:  MMT Right Eval 04/15/22 Left Eval 04/15/22  Hip flexion 5 5  Hip extension Unable  Unable   Hip abduction 4+ 4+  Hip adduction    Hip internal rotation    Hip external rotation    Knee flexion    Knee extension 5 5  Ankle dorsiflexion 5 5  Ankle plantarflexion    Ankle inversion    Ankle eversion     (Blank rows = not tested)    FUNCTIONAL TESTS:  30 seconds chair stand test: 13 reps, hands on thighs   GAIT:  Decreased stride, antalgic, no AD    TODAY'S TREATMENT: 05/14/22 Nu step 5 min lv 5 (seat 10)   Standing: Calf stretch 3 x 20" Heel raise x20 Toe rise x20 Step up 6 inch 2x15 each Side step up 6 inch 2x15 each  TKE 3 plates x20 each  Band hip abduction RTB 2 x 10 Band hip extension RTB 2 x  10  Seated: Leg press 5 plates 3 x 10  HS curl 6 plates 3 x 10   11/09/08  Ab brace x10 Bridge Ab march SLR Sidelying hip abduction x 10 each   Calf stretch at step 2 x 30" Heel raise 2 x 10  Toe raise 2 x 10  FWD step up 4 inch x 15 each   Leg press 3 plates 3 x 10 HS curl machine 5 plates 3 R60     Nustep 6 min EOS lv 5 (seat 8)    05/03/22: Supine: Bridge 10x Quad set prior SLR floating with ab set 10x SKTC 2x 30" LTR 5x 10"  Prone: Prone x 2 min POE x 37mn Prone heel squeeze 10 x5"   Sidelying: Abd 10x BLE  Standing: STS 10x eccentric control no HHA Standing extension 10x 5" TKE with RTB 10x5  05/01/22: Hom[place] DVT testing: no redness, heat, or pain with DF and gastroc squeeze Patella mobs all  Measurements for compression garments  Supine: SAQ 10x Bridge Heel slide: 10x AROM Lt kee: lacking 4-125 degrees AROM Rt knee lacking 3-130 degrees Bent knee raise with ab set 10x 5" LTR 5x 10"  Sidelying clam with RTB 10x 5"BLE  04/17/22: Supine: Diaphragmatic breathing x 1 min Ab bracing with exhale 3-5" holds x 2 min Bent knee raise with ab set 10x 5" Clam with RTB 10x 5" for stability Bridge 10x 5" Quad sets 10x 5" BLE  Seated: seated posture  Wback 10x   04/15/22 Bridge Ab brace    PATIENT EDUCATION:  Education details: 04/17/22:  Reviewed goals, educated importance of HEP compliance with HEP compliance for maximal benefits, educated benefits with compression garments for edema control and hx of blood clot, educated benefits with posture for pain control. EvaL: on evaluation findings, POC and HEP Person educated: Patient Education method: Explanation Education comprehension: verbalized understanding   HOME EXERCISE  PROGRAM: Access Code: [email or web address] URL: [email or web address] 05/14/22 - Hip Abduction with Resistance Loop  - 1 x daily - 7 x weekly - 3 sets - 10 reps - Hip Extension with Resistance Loop  - 1 x daily - 7 x weekly -  3 sets - 10 reps - Standing Heel Raise with Support  - 1 x daily - 7 x weekly - 3 sets - 10 reps - Heel Toe Raises with Counter Support  - 1 x daily - 7 x weekly - 3 sets - 10 reps  Date: 04/15/2022 Prepared by: Josue Hector  Exercises - Supine Transversus Abdominis Bracing - Hands on Stomach  - 2-3 x daily - 7 x weekly - 2 sets - 10 reps - 5 second hold - Supine Bridge  - 2-3 x daily - 7 x weekly - 2 sets - 10 reps - 5 second hold  04/17/22: Quad sets, Bent knee raise, clam with RTB 05/01/22: LTR 05/03/22:  Prone on elbow x 2 min, heel squeeze.  ASSESSMENT:  CLINICAL IMPRESSION: Patient progressing well. Demos good strength with additional LE exercises today. Able to increase weight with machine leg press and hamstring curls. Patient continues to be limited by altered body mechanics which continues to contribute to pain. Patient will continue to benefit from skilled therapy services to reduce remaining deficits and improve functional ability.    OBJECTIVE IMPAIRMENTS Abnormal gait, decreased activity tolerance, decreased ROM, increased fascial restrictions, impaired flexibility, improper body mechanics, and pain.   ACTIVITY LIMITATIONS lifting, bending, standing, squatting, stairs, and locomotion level  PARTICIPATION LIMITATIONS: meal prep, cleaning, laundry, shopping, community activity, and yard work  PERSONAL FACTORS 3+ comorbidities: arthritis, multiple body regions, chronicity  are also affecting patient's functional outcome.   REHAB POTENTIAL: Fair see above  CLINICAL DECISION MAKING: Stable/uncomplicated  EVALUATION COMPLEXITY: Low   GOALS: SHORT TERM GOALS: Target date: 05/06/2022  Patient will be independent with initial HEP and self-management strategies to improve functional outcomes Baseline:  Goal status: Ongoing  LONG TERM GOALS: Target date: 05/27/2022  Patient will be independent with advanced HEP and self-management strategies to improve functional  outcomes Baseline:  Goal status: Ongoing  2.  Patient will improve FOTO score to predicted value to indicate improvement in functional outcomes Baseline: 47% function Goal status: Ongoing  3.  Patient will report reduction of back and knee pain to <4/10 at rest for improved quality of life and ability to perform ADLs  Baseline: 9/10 Goal status: Ongoing  4. Patient will be able to complete at least 20 reps during 30 second sit to stand to demo improved functional strength and ability to perform ADLs.  Baseline: 13 reps Goal status: Ongoing  PLAN: PT FREQUENCY: 2x/week  PT DURATION: 6 weeks  PLANNED INTERVENTIONS: Therapeutic exercises, Therapeutic activity, Neuromuscular re-education, Balance training, Gait training, Patient/Family education, Joint manipulation, Joint mobilization, Stair training, Aquatic Therapy, Dry Needling, Electrical stimulation, Spinal manipulation, Spinal mobilization, Cryotherapy, Moist heat, scar mobilization, Taping, Traction, Ultrasound, Biofeedback, Ionotophoresis '4mg'$ /ml Dexamethasone, and Manual therapy.   PLAN FOR NEXT SESSION: Progress core, glute and quad strengthening as tolerated. Knee and lumbar mobility progression as able. F/u with knee and compression garments  9:40 AM, 05/14/22 Josue Hector PT DPT  Physical Therapist with [facility]  [phone]

## 2022-05-14 NOTE — Telephone Encounter (Signed)
Patient called office to inquire about the status of her CTA scan. I returned pt's call and VM was not set up. Will cal back at later time.

## 2022-05-15 ENCOUNTER — Ambulatory Visit: Payer: Medicaid Other | Admitting: Podiatry

## 2022-05-15 ENCOUNTER — Ambulatory Visit: Payer: Medicaid Other

## 2022-05-15 ENCOUNTER — Encounter: Payer: Self-pay | Admitting: Podiatry

## 2022-05-15 DIAGNOSIS — M67874 Other specified disorders of tendon, left ankle and foot: Secondary | ICD-10-CM

## 2022-05-15 DIAGNOSIS — M1A3720 Chronic gout due to renal impairment, left ankle and foot, without tophus (tophi): Secondary | ICD-10-CM | POA: Diagnosis not present

## 2022-05-15 DIAGNOSIS — Z86718 Personal history of other venous thrombosis and embolism: Secondary | ICD-10-CM

## 2022-05-15 LAB — POCT INR
INR: 2.2 (ref 2.0–3.0)
PT: 26.1

## 2022-05-15 MED ORDER — COLCHICINE 0.6 MG PO TABS: ORAL_TABLET | ORAL | 2 refills | Status: DC

## 2022-05-15 NOTE — Patient Instructions (Signed)
Description   5 mg daily except 2.5 mg on Fri

## 2022-05-16 ENCOUNTER — Encounter (HOSPITAL_COMMUNITY): Payer: Self-pay | Admitting: Physical Therapy

## 2022-05-16 ENCOUNTER — Ambulatory Visit (HOSPITAL_COMMUNITY): Payer: Medicaid Other | Admitting: Physical Therapy

## 2022-05-16 ENCOUNTER — Encounter: Payer: Self-pay | Admitting: Vascular Surgery

## 2022-05-16 DIAGNOSIS — R262 Difficulty in walking, not elsewhere classified: Secondary | ICD-10-CM

## 2022-05-16 DIAGNOSIS — M79672 Pain in left foot: Secondary | ICD-10-CM | POA: Diagnosis not present

## 2022-05-16 DIAGNOSIS — M25561 Pain in right knee: Secondary | ICD-10-CM | POA: Diagnosis not present

## 2022-05-16 DIAGNOSIS — G8929 Other chronic pain: Secondary | ICD-10-CM | POA: Diagnosis not present

## 2022-05-16 DIAGNOSIS — M25562 Pain in left knee: Secondary | ICD-10-CM | POA: Diagnosis not present

## 2022-05-16 DIAGNOSIS — M25572 Pain in left ankle and joints of left foot: Secondary | ICD-10-CM | POA: Diagnosis not present

## 2022-05-16 DIAGNOSIS — M5459 Other low back pain: Secondary | ICD-10-CM

## 2022-05-16 NOTE — Therapy (Addendum)
OUTPATIENT PHYSICAL THERAPY LOWER EXTREMITY TREATMENT   Patient Name: Jane [name] MRN: [id number] DOB:1966/05/09, 56 y.o., female Today's Date: 05/16/2022  PHYSICAL THERAPY DISCHARGE SUMMARY  Visits from Start of Care: 7  Current functional level related to goals / functional outcomes: See below   Remaining deficits: See below    Education / Equipment: See assessment   Patient agrees to discharge. Patient goals were partially met. Patient is being discharged due to being pleased with the current functional level.    05/16/22 0907  PT Visits / Re-Eval  Visit Number 7  Number of Visits 12  Date for PT Re-Evaluation 05/27/22  Authorization  Authorization Type [place] Medicaid Healthy*  Authorization Time Period approved 7 visits from 6/12-->06/13/22  Authorization - Visit Number 7  Authorization - Number of Visits 7  Progress Note Due on Visit 7  PT Time Calculation  PT Start Time 0904  PT Stop Time 0943  PT Time Calculation (min) 39 min  PT - End of Session  Activity Tolerance Patient tolerated treatment well  Behavior During Therapy WFL for tasks assessed/performed      Past Medical History:  Diagnosis Date   ADD (attention deficit disorder)    Anxiety    Back pain    Bilateral swelling of feet    Bipolar 1 disorder (HCC)    Bipolar 1 disorder (HCC)    Bipolar disorder (HCC)    Chewing difficulty    Chronic fatigue syndrome    Chronic kidney disease    Stage 3 kidney disease;dx by Dr. Sinda Du.    Constipation    Depression    Diabetes mellitus without complication (HCC)    diet controlled   Dyspnea    with exertion   GERD (gastroesophageal reflux disease)    Headache    migraines   High cholesterol    History of blood clots    History of DVT (deep vein thrombosis)    left leg   Hypertension    states under control with meds., has been on med. x 2 years   Hypothyroidism    Joint pain    Neuropathy    Obsessive-compulsive disorder     Peripheral vascular disease ([place])    Prediabetes    Respiratory failure requiring intubation ([place])    Restless leg syndrome    Shortness of breath    Sleep apnea    Thrombocytopenia ([place]) 09/15/2019   Trigger thumb of left hand 01/2018   Trigger thumb of right hand    Past Surgical History:  Procedure Laterality Date   ABDOMINAL HYSTERECTOMY  06/2016   complete   APPLICATION OF WOUND VAC Left 04/27/2019   Procedure: APPLICATION OF WOUND VAC LEFT GROIN;  Surgeon: Waynetta Sandy, MD;  Location: [place];  Service: Vascular;  Laterality: Left;   AV FISTULA PLACEMENT Left 11/24/2018   Procedure: ARTERIOVENOUS (AV) FISTULA CREATION LEFT SFA TO LEFT FEMORAL VEIN;  Surgeon: Waynetta Sandy, MD;  Location: [place];  Service: Vascular;  Laterality: Left;   BACK SURGERY     CHOLECYSTECTOMY     COLONOSCOPY WITH PROPOFOL N/A 11/22/2019   Procedure: COLONOSCOPY WITH PROPOFOL;  Surgeon: [name], MD;  Location: [facility]OSCOPY;  Service: Gastroenterology;  Laterality: N/A;   FEMORAL ARTERY EXPLORATION  03/30/2019   Procedure: Left Common Femoral Artery and Vein Exploration;  Surgeon: Waynetta Sandy, MD;  Location: [place];  Service: Vascular;;   FEMORAL-FEMORAL BYPASS GRAFT Left 11/24/2018   Procedure: BYPASS GRAFT FEMORAL-FEMORAL VENOUS  LEFT TO RIGHT PALMA PROCEDURE USING CRYOVEIN;  Surgeon: Waynetta Sandy, MD;  Location: [place];  Service: Vascular;  Laterality: Left;   GROIN DEBRIDEMENT Left 04/27/2019   Procedure: GROIN DEBRIDEMENT;  Surgeon: Waynetta Sandy, MD;  Location: [place];  Service: Vascular;  Laterality: Left;   INSERTION OF ILIAC STENT  03/30/2019   Procedure: Stent of left common, external iliac veins and left common femoral vein;  Surgeon: Waynetta Sandy, MD;  Location: [place];  Service: Vascular;;   KNEE ARTHROSCOPY WITH MENISCAL REPAIR Left 11/14/2020   Procedure: LEFT KNEE ARTHROSCOPY WITH PARTIAL MEDIAL MENISCECTOMY;  Surgeon:  Carole Civil, MD;  Location: [facility];  Service: Orthopedics;  Laterality: Left;   LOWER EXTREMITY VENOGRAPHY N/A 08/17/2018   Procedure: LOWER EXTREMITY VENOGRAPHY - Central Venogram;  Surgeon: Waynetta Sandy, MD;  Location: [place] [facility];  Service: Cardiovascular;  Laterality: N/A;   LOWER EXTREMITY VENOGRAPHY Bilateral 03/09/2019   Procedure: LOWER EXTREMITY VENOGRAPHY;  Surgeon: Waynetta Sandy, MD;  Location: [place] [facility];  Service: Cardiovascular;  Laterality: Bilateral;   LOWER EXTREMITY VENOGRAPHY Left 08/16/2019   Procedure: LOWER EXTREMITY VENOGRAPHY;  Surgeon: Waynetta Sandy, MD;  Location: [place] [facility];  Service: Cardiovascular;  Laterality: Left;   LOWER EXTREMITY VENOGRAPHY Left 03/25/2022   Procedure: LOWER EXTREMITY VENOGRAPHY;  Surgeon: Waynetta Sandy, MD;  Location: [place] [facility];  Service: Cardiovascular;  Laterality: Left;  IVUS   LUMBAR FUSION  11/21/2000   L5-S1   LUMBAR SPINE SURGERY     x 2 others   PATCH ANGIOPLASTY Left 03/30/2019   Procedure: Patch Angioplasty of the Left Common Femoral Vein using Venosure Biologic patch;  Surgeon: Waynetta Sandy, MD;  Location: [place];  Service: Vascular;  Laterality: Left;   PERIPHERAL VASCULAR INTERVENTION Left 08/16/2019   Procedure: PERIPHERAL VASCULAR INTERVENTION;  Surgeon: Waynetta Sandy, MD;  Location: [place] [facility];  Service: Cardiovascular;  Laterality: Left;  common femoral/femoral vein stent   PERIPHERAL VASCULAR INTERVENTION Left 03/25/2022   Procedure: PERIPHERAL VASCULAR INTERVENTION;  Surgeon: Waynetta Sandy, MD;  Location: [place] [facility];  Service: Cardiovascular;  Laterality: Left;  COMMON FEMORAL VEIN   PERIPHERAL VASCULAR THROMBECTOMY Left 03/25/2022   Procedure: PERIPHERAL VASCULAR THROMBECTOMY;  Surgeon: Waynetta Sandy, MD;  Location: [place] [facility];  Service: Cardiovascular;  Laterality: Left;   EXTREMITY/IVC   TRIGGER FINGER RELEASE Right 12/01/2017   Procedure: RELEASE TRIGGER FINGER/A-1 PULLEY RIGHT THUMB;  Surgeon: Leanora Cover, MD;  Location: [place];  Service: Orthopedics;  Laterality: Right;   TRIGGER FINGER RELEASE Left 01/26/2018   Procedure: LEFT TRIGGER THUMB RELEASE;  Surgeon: Leanora Cover, MD;  Location: [place];  Service: Orthopedics;  Laterality: Left;   ULTRASOUND GUIDANCE FOR VASCULAR ACCESS Right 03/30/2019   Procedure: Ultrasound-guided cannulation right internal jugular vein;  Surgeon: Waynetta Sandy, MD;  Location: [facility];  Service: Vascular;  Laterality: Right;   Patient Active Problem List   Diagnosis Date Noted   Chronic pain of both knees 10/22/2021   Hematuria 10/01/2021   Recurrent UTI 10/01/2021   Dyspnea on exertion 10/01/2021   Iron deficiency anemia 04/18/2021   Nocturnal sleep-related eating disorder 01/01/2021   Vitamin D deficiency 01/01/2021   Arthritis of left knee 12/26/2020   S/P left knee arthroscopy 11/14/20  11/21/2020   Old complex tear of medial meniscus of left knee    Primary osteoarthritis of left knee  11/09/2020   Subacromial bursitis of right shoulder joint 11/09/2020   Chronic pain syndrome 04/24/2020   Pharmacologic therapy 04/24/2020   Disorder of skeletal system 04/24/2020   Problems influencing health status 04/24/2020   History of thrombocytopenia 04/24/2020   Abnormal MRI, cervical spine (01/08/2017) 04/24/2020   Abnormal MRI, lumbar spine (05/11/2014) 04/24/2020   DDD (degenerative disc disease), cervical 04/24/2020   DDD (degenerative disc disease), lumbar 04/24/2020   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   Diabetic peripheral neuropathy ([place]) 04/24/2020   Neurogenic pain 04/24/2020   Chronic musculoskeletal pain 04/24/2020   Other proteinuria 03/30/2020   Myalgia 03/15/2020   Bilateral hand swelling 02/16/2020   Thrush 01/20/2020   Morbid obesity ([place]) 10/25/2019    History of back surgery 10/25/2019   Peripheral neuropathy 10/25/2019   Restless leg syndrome 10/25/2019   Chronic low back pain (Bilateral) w/ sciatica (Bilateral) 10/25/2019   Hypertension associated with diabetes ([place]) 10/25/2019   Type 2 diabetes mellitus with stage 3 chronic kidney disease, without long-term current use of insulin ([place]) 10/25/2019   Chronic anticoagulation (Coumadin) 10/25/2019   Hyperlipidemia associated with type 2 diabetes mellitus ([place]) 10/25/2019   CKD stage 3 due to type 2 diabetes mellitus ([place]) 10/25/2019   GERD (gastroesophageal reflux disease) 10/25/2019   Allergic rhinitis 10/25/2019   Chronic constipation 10/25/2019   Hypothyroidism    Normocytic anemia 09/15/2019   Iliac vein stenosis, 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   Chronic venous insufficiency 11/21/2017   Displacement of lumbar intervertebral disc without myelopathy 07/14/2017   Radicular low back pain 05/25/2014   Depression, major, recurrent ([place]) 06/17/2013   OCD (obsessive compulsive disorder) 11/20/2012   Insomnia due to mental disorder 09/25/2012   Bipolar 1 disorder ([place]) 09/25/2012   History of DVT (deep vein thrombosis) 08/18/2012    PCP: Lavon Paganini MD  REFERRING PROVIDER:  Kurtis Bushman, MD  Apt: MD Donzetta Matters 05/08/22  REFERRING DIAG: PT eval/tx for M17.0 bilateral osteoarthritis of knees  THERAPY DIAG:  Chronic pain of both knees  Other low back pain  Difficulty in walking, not elsewhere classified  Pain in left foot  Rationale for Evaluation and Treatment Rehabilitation  ONSET DATE: Chronic   SUBJECTIVE:   SUBJECTIVE STATEMENT: Patient reports good improvement since starting therapy. She reports 80% overall improvement. She is still limited by pain she states this appears to fluctuate with the weather. She has a new referral for some issues that she is having with her foot from a new provider.   PERTINENT HISTORY: Lumbar surgery x 3, LT knee  surgery, hx of blood clot  PAIN:  Are you having pain? Yes: NPRS scale: 7/10 Pain location: Low back, LT knee  Pain description: stiff, pressure  Aggravating factors: "everything"  Relieving factors: sitting, laying down, leaning   PRECAUTIONS: None  WEIGHT BEARING RESTRICTIONS No  FALLS:  Has patient fallen in last 6 months? No  LIVING ENVIRONMENT: Lives with: lives with their family and lives alone Lives in: House/apartment Stairs: Yes: External: 3 steps; on right going up Has following equipment at home: Single point cane  OCCUPATION: Disability  PLOF: Independent with basic ADLs  PATIENT GOALS To avoid surgery    OBJECTIVE:   DIAGNOSTIC FINDINGS: Impression grade 1/2 arthritis left knee Impression normal right knee IMPRESSION: 1. Postsurgical changes at L5-S1. 2. Progression of degenerative changes most notable at L4-L5.    PATIENT SURVEYS:  FOTO 63% function   (was 45%)  COGNITION:  Overall cognitive status: Within functional  limits for tasks assessed     Lumbar AROM:  Flexion: 60% limited Extension: 100% limited RT sidebend: 50% limited LT sidebend: 50% limited    LOWER EXTREMITY ROM: (unable to lay prone for hip extension testing)   Active ROM Right Eval 04/15/22 Left Eval 04/15/22 Right  05/01/22 Right 05/16/22 Left 05/01/22 Left  05/16/22  Hip flexion        Hip extension        Hip abduction        Hip adduction        Hip internal rotation        Hip external rotation        Knee flexion 110 110 130 130 125 122  Knee extension -6 -7 Lacking 3 -5 Lacking 4 -4  Ankle dorsiflexion        Ankle plantarflexion        Ankle inversion        Ankle eversion         (Blank rows = not tested)  LOWER EXTREMITY MMT:  MMT Right Eval 04/15/22 Right  05/16/22 Left Eval 04/15/22 Left 05/16/22  Hip flexion $RemoveBef'5 5 5 5  '[id number]$ Hip extension Unable  4+ (in avail ROM) Unable  4+ (in avail ROM)  Hip abduction 4+ 5 4+ 5  Hip adduction      Hip internal rotation       Hip external rotation      Knee flexion      Knee extension $RemoveBefore'5 5 5 5  '[id number]$ Ankle dorsiflexion 5  5   Ankle plantarflexion      Ankle inversion      Ankle eversion       (Blank rows = not tested)    FUNCTIONAL TESTS:  30 seconds chair stand test: 13 reps, hands on thighs   GAIT:  Decreased stride, antalgic, no AD    TODAY'S TREATMENT: 05/16/22 Reasess AROM MMT FOTO  STS test  TKE 3 plates x20 Leg press 6 plates 3 x 10 Hamstring curl 6 plates 3 x 10  Rec bike 4 min EOS for ROM Lv3   05/14/22 Nu step 5 min lv 5 (seat 10)   Standing: Calf stretch 3 x 20" Heel raise x20 Toe rise x20 Step up 6 inch 2x15 each Side step up 6 inch 2x15 each  TKE 3 plates x20 each  Band hip abduction RTB 2 x 10 Band hip extension RTB 2 x 10  Seated: Leg press 5 plates 3 x 10  HS curl 6 plates 3 x 10   12/08/38  Ab brace x10 Bridge Ab march SLR Sidelying hip abduction x 10 each   Calf stretch at step 2 x 30" Heel raise 2 x 10  Toe raise 2 x 10  FWD step up 4 inch x 15 each   Leg press 3 plates 3 x 10 HS curl machine 5 plates 3 N02     Nustep 6 min EOS lv 5 (seat 8)    05/03/22: Supine: Bridge 10x Quad set prior SLR floating with ab set 10x SKTC 2x 30" LTR 5x 10"  Prone: Prone x 2 min POE x 48min Prone heel squeeze 10 x5"   Sidelying: Abd 10x BLE  Standing: STS 10x eccentric control no HHA Standing extension 10x 5" TKE with RTB 10x5  05/01/22: Homans DVT testing: no redness, heat, or pain with DF and gastroc squeeze Patella mobs all  Measurements for compression garments  Supine: SAQ 10x Bridge Heel  slide: 10x AROM Lt kee: lacking 4-125 degrees AROM Rt knee lacking 3-130 degrees Bent knee raise with ab set 10x 5" LTR 5x 10"  Sidelying clam with RTB 10x 5"BLE  04/17/22: Supine: Diaphragmatic breathing x 1 min Ab bracing with exhale 3-5" holds x 2 min Bent knee raise with ab set 10x 5" Clam with RTB 10x 5" for stability Bridge 10x 5" Quad sets 10x  5" BLE  Seated: seated posture  Wback 10x   04/15/22 Bridge Ab brace    PATIENT EDUCATION:  Education details: 04/17/22:  Reviewed goals, educated importance of HEP compliance with HEP compliance for maximal benefits, educated benefits with compression garments for edema control and hx of blood clot, educated benefits with posture for pain control. EvaL: on evaluation findings, POC and HEP Person educated: Patient Education method: Explanation Education comprehension: verbalized understanding   HOME EXERCISE PROGRAM: Access Code: [email or web address] URL: [email or web address] 05/14/22 - Hip Abduction with Resistance Loop  - 1 x daily - 7 x weekly - 3 sets - 10 reps - Hip Extension with Resistance Loop  - 1 x daily - 7 x weekly - 3 sets - 10 reps - Standing Heel Raise with Support  - 1 x daily - 7 x weekly - 3 sets - 10 reps - Heel Toe Raises with Counter Support  - 1 x daily - 7 x weekly - 3 sets - 10 reps  Date: 04/15/2022 Prepared by: Josue Hector  Exercises - Supine Transversus Abdominis Bracing - Hands on Stomach  - 2-3 x daily - 7 x weekly - 2 sets - 10 reps - 5 second hold - Supine Bridge  - 2-3 x daily - 7 x weekly - 2 sets - 10 reps - 5 second hold  04/17/22: Quad sets, Bent knee raise, clam with RTB 05/01/22: LTR 05/03/22:  Prone on elbow x 2 min, heel squeeze.  ASSESSMENT:  CLINICAL IMPRESSION: Patient showing good functional progress. Good strength gains. Knee ROM improved from initial Eval, but so significant change since last assessment. Patient reporting significant subjective improvement in knees and back, though remains pain limited depending on level of activity and weather (per patient report). Patient also noting some limitation due to LT ankle/ foot pain but is being scheduled for evaluation for this for referral from new provider. Patient being DC for current episode with goals partially MET. Reviewed HEP and answered all patient questions.     OBJECTIVE IMPAIRMENTS Abnormal gait, decreased activity tolerance, decreased ROM, increased fascial restrictions, impaired flexibility, improper body mechanics, and pain.   ACTIVITY LIMITATIONS lifting, bending, standing, squatting, stairs, and locomotion level  PARTICIPATION LIMITATIONS: meal prep, cleaning, laundry, shopping, community activity, and yard work  PERSONAL FACTORS 3+ comorbidities: arthritis, multiple body regions, chronicity  are also affecting patient's functional outcome.   REHAB POTENTIAL: Fair see above  CLINICAL DECISION MAKING: Stable/uncomplicated  EVALUATION COMPLEXITY: Low   GOALS: SHORT TERM GOALS: Target date: 05/06/2022  Patient will be independent with initial HEP and self-management strategies to improve functional outcomes Baseline:  Goal status: MET  LONG TERM GOALS: Target date: 05/27/2022  Patient will be independent with advanced HEP and self-management strategies to improve functional outcomes Baseline: Reviewed and answered all questions  Goal status: MET  2.  Patient will improve FOTO score to predicted value to indicate improvement in functional outcomes Baseline: 63% function Goal status: MET  3.  Patient will report reduction of back and knee pain to <4/10 at rest for improved quality of life and  ability to perform ADLs  Baseline: 7/10 Goal status: Not MET  4. Patient will be able to complete at least 20 reps during 30 second sit to stand to demo improved functional strength and ability to perform ADLs.  Baseline: 14 reps Goal status: Not MET   PLAN: PT FREQUENCY: 2x/week  PT DURATION: 6 weeks  PLANNED INTERVENTIONS: Therapeutic exercises, Therapeutic activity, Neuromuscular re-education, Balance training, Gait training, Patient/Family education, Joint manipulation, Joint mobilization, Stair training, Aquatic Therapy, Dry Needling, Electrical stimulation, Spinal manipulation, Spinal mobilization, Cryotherapy, Moist heat, scar  mobilization, Taping, Traction, Ultrasound, Biofeedback, Ionotophoresis 4mg /ml Dexamethasone, and Manual therapy.   PLAN FOR NEXT SESSION: DC to HEP   9:07 AM, 05/16/22 Josue Hector PT DPT  Physical Therapist with [facility]  [phone]

## 2022-05-17 ENCOUNTER — Telehealth: Payer: Self-pay | Admitting: Vascular Surgery

## 2022-05-17 DIAGNOSIS — F3132 Bipolar disorder, current episode depressed, moderate: Secondary | ICD-10-CM | POA: Diagnosis not present

## 2022-05-17 NOTE — Telephone Encounter (Signed)
Pt called 2x and left My Chart message about scheduling her CT scan. Her appt with [place] isnt until August. I called [facility] to get appt and called patient back to let her know when it is. No answer. Left a detailed vm.

## 2022-05-18 DIAGNOSIS — F3132 Bipolar disorder, current episode depressed, moderate: Secondary | ICD-10-CM | POA: Diagnosis not present

## 2022-05-18 NOTE — Progress Notes (Signed)
  Subjective:  Patient ID: Jane [name], female    DOB: November 24, 1965,  MRN: [id number]  Chief Complaint  Patient presents with   Foot Pain    "It's still the same thing.  I need a new prescription for the Gout medicine that he gave me the last time."    56 y.o. female presents with the above complaint. History confirmed with patient.  She has numbness in the left foot especially in the toes can be painful and the nails in the tips of the toes.  Feet always hurt all over physical cramping in her arches.  Also has swelling and tenderness at the insertion of her Achilles.  She previously has had ingrown toenails does not think that they were properly resolved.  Never evaluated for gout.  She has an upcoming venogram with Dr. Donzetta Matters.  She does have a history of spinal pathology as well  Interval history: Has not had tons of improvement still bothering her.  She did state that it improved while she was taking the colchicine  Objective:  Physical Exam: warm, good capillary refill, no trophic changes or ulcerative lesions, normal DP and PT pulses, and abnormal sensory exam with loss of protective sensation in the toes and pain at the insertion of the Achilles.  Assessment:   1. 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 Achilles tendinosis of left ankle      Plan:  Patient was evaluated and treated and all questions answered.  Recommend continuing therapy.  I sent a referral to [facility] physical therapy.  We also discussed another round of colchicine she felt like this helped him last time so we will repeat this as well.  We discussed that her uric acid was elevated and even though within the normal range it was elevated that still could be a component of gout.  I will see her back in 2 months for reevaluation.  Return in about 8 weeks (around 07/10/2022) for re-check Achilles tendon.

## 2022-05-20 ENCOUNTER — Encounter (INDEPENDENT_AMBULATORY_CARE_PROVIDER_SITE_OTHER): Payer: Self-pay | Admitting: Family Medicine

## 2022-05-20 ENCOUNTER — Ambulatory Visit (INDEPENDENT_AMBULATORY_CARE_PROVIDER_SITE_OTHER): Payer: Medicaid Other | Admitting: Family Medicine

## 2022-05-20 VITALS — BP 122/72 | HR 73 | Temp 98.0°F | Ht 70.0 in | Wt 246.0 lb

## 2022-05-20 DIAGNOSIS — E1165 Type 2 diabetes mellitus with hyperglycemia: Secondary | ICD-10-CM

## 2022-05-20 DIAGNOSIS — E669 Obesity, unspecified: Secondary | ICD-10-CM

## 2022-05-20 DIAGNOSIS — F3289 Other specified depressive episodes: Secondary | ICD-10-CM | POA: Diagnosis not present

## 2022-05-20 DIAGNOSIS — Z7985 Long-term (current) use of injectable non-insulin antidiabetic drugs: Secondary | ICD-10-CM

## 2022-05-20 DIAGNOSIS — Z6835 Body mass index (BMI) 35.0-35.9, adult: Secondary | ICD-10-CM

## 2022-05-20 MED ORDER — SEMAGLUTIDE (1 MG/DOSE) 4 MG/3ML ~~LOC~~ SOPN: 1.0000 mg | PEN_INJECTOR | SUBCUTANEOUS | 0 refills | Status: DC

## 2022-05-21 ENCOUNTER — Ambulatory Visit (HOSPITAL_COMMUNITY): Payer: Medicaid Other | Admitting: Physical Therapy

## 2022-05-21 ENCOUNTER — Encounter (HOSPITAL_COMMUNITY): Payer: Self-pay | Admitting: Physical Therapy

## 2022-05-21 DIAGNOSIS — M25562 Pain in left knee: Secondary | ICD-10-CM | POA: Diagnosis not present

## 2022-05-21 DIAGNOSIS — F3132 Bipolar disorder, current episode depressed, moderate: Secondary | ICD-10-CM | POA: Diagnosis not present

## 2022-05-21 DIAGNOSIS — M5459 Other low back pain: Secondary | ICD-10-CM | POA: Diagnosis not present

## 2022-05-21 DIAGNOSIS — M25572 Pain in left ankle and joints of left foot: Secondary | ICD-10-CM | POA: Diagnosis not present

## 2022-05-21 DIAGNOSIS — M25561 Pain in right knee: Secondary | ICD-10-CM | POA: Diagnosis not present

## 2022-05-21 DIAGNOSIS — M79672 Pain in left foot: Secondary | ICD-10-CM | POA: Diagnosis not present

## 2022-05-21 DIAGNOSIS — G8929 Other chronic pain: Secondary | ICD-10-CM | POA: Diagnosis not present

## 2022-05-21 DIAGNOSIS — R262 Difficulty in walking, not elsewhere classified: Secondary | ICD-10-CM | POA: Diagnosis not present

## 2022-05-21 NOTE — Therapy (Signed)
OUTPATIENT PHYSICAL THERAPY LOWER EXTREMITY EVALUATION   Patient Name: Jane [name] MRN: [id number] DOB:10-Aug-1966, 56 y.o., female Today's Date: 05/21/2022   PT End of Session - 05/21/22 1026     Visit Number 1    Number of Visits 12    Date for PT Re-Evaluation 07/02/22    Authorization Type [place] Medicaid Healthy*    Authorization Time Period Submitted for 12 on 05/21/22 (check auth)    Authorization - Visit Number 1    Authorization - Number of Visits 1    Progress Note Due on Visit 10    PT Start Time [phone]    PT Stop Time 1028    PT Time Calculation (min) 42 min    Activity Tolerance Patient tolerated treatment well    Behavior During Therapy WFL for tasks assessed/performed             Past Medical History:  Diagnosis Date   ADD (attention deficit disorder)    Anxiety    Back pain    Bilateral swelling of feet    Bipolar 1 disorder (HCC)    Bipolar 1 disorder (HCC)    Bipolar disorder (HCC)    Chewing difficulty    Chronic fatigue syndrome    Chronic kidney disease    Stage 3 kidney disease;dx by Dr. Sinda Du.    Constipation    Depression    Diabetes mellitus without complication (HCC)    diet controlled   Dyspnea    with exertion   GERD (gastroesophageal reflux disease)    Headache    migraines   High cholesterol    History of blood clots    History of DVT (deep vein thrombosis)    left leg   Hypertension    states under control with meds., has been on med. x 2 years   Hypothyroidism    Joint pain    Neuropathy    Obsessive-compulsive disorder    Peripheral vascular disease ([place])    Prediabetes    Respiratory failure requiring intubation ([place])    Restless leg syndrome    Shortness of breath    Sleep apnea    Thrombocytopenia ([place]) 09/15/2019   Trigger thumb of left hand 01/2018   Trigger thumb of right hand    Past Surgical History:  Procedure Laterality Date   ABDOMINAL HYSTERECTOMY  06/2016   complete   APPLICATION OF WOUND VAC  Left 04/27/2019   Procedure: APPLICATION OF WOUND VAC LEFT GROIN;  Surgeon: Waynetta Sandy, MD;  Location: [place];  Service: Vascular;  Laterality: Left;   AV FISTULA PLACEMENT Left 11/24/2018   Procedure: ARTERIOVENOUS (AV) FISTULA CREATION LEFT SFA TO LEFT FEMORAL VEIN;  Surgeon: Waynetta Sandy, MD;  Location: [place];  Service: Vascular;  Laterality: Left;   BACK SURGERY     CHOLECYSTECTOMY     COLONOSCOPY WITH PROPOFOL N/A 11/22/2019   Procedure: COLONOSCOPY WITH PROPOFOL;  Surgeon: [name], MD;  Location: [facility];  Service: Gastroenterology;  Laterality: N/A;   FEMORAL ARTERY EXPLORATION  03/30/2019   Procedure: Left Common Femoral Artery and Vein Exploration;  Surgeon: Waynetta Sandy, MD;  Location: [place];  Service: Vascular;;   FEMORAL-FEMORAL BYPASS GRAFT Left 11/24/2018   Procedure: BYPASS GRAFT FEMORAL-FEMORAL VENOUS LEFT TO RIGHT PALMA PROCEDURE USING CRYOVEIN;  Surgeon: Waynetta Sandy, MD;  Location: [place];  Service: Vascular;  Laterality: Left;   GROIN DEBRIDEMENT Left 04/27/2019   Procedure: [name] DEBRIDEMENT;  Surgeon: Waynetta Sandy,  MD;  Location: [place];  Service: Vascular;  Laterality: Left;   INSERTION OF ILIAC STENT  03/30/2019   Procedure: Stent of left common, external iliac veins and left common femoral vein;  Surgeon: Waynetta Sandy, MD;  Location: [place];  Service: Vascular;;   KNEE ARTHROSCOPY WITH MENISCAL REPAIR Left 11/14/2020   Procedure: LEFT KNEE ARTHROSCOPY WITH PARTIAL MEDIAL MENISCECTOMY;  Surgeon: Carole Civil, MD;  Location: [facility];  Service: Orthopedics;  Laterality: Left;   LOWER EXTREMITY VENOGRAPHY N/A 08/17/2018   Procedure: LOWER EXTREMITY VENOGRAPHY - Central Venogram;  Surgeon: Waynetta Sandy, MD;  Location: [place] [facility];  Service: Cardiovascular;  Laterality: N/A;   LOWER EXTREMITY VENOGRAPHY Bilateral 03/09/2019   Procedure: LOWER EXTREMITY VENOGRAPHY;  Surgeon:  Waynetta Sandy, MD;  Location: [place] [facility];  Service: Cardiovascular;  Laterality: Bilateral;   LOWER EXTREMITY VENOGRAPHY Left 08/16/2019   Procedure: LOWER EXTREMITY VENOGRAPHY;  Surgeon: Waynetta Sandy, MD;  Location: [place] [facility];  Service: Cardiovascular;  Laterality: Left;   LOWER EXTREMITY VENOGRAPHY Left 03/25/2022   Procedure: LOWER EXTREMITY VENOGRAPHY;  Surgeon: Waynetta Sandy, MD;  Location: [place] [facility];  Service: Cardiovascular;  Laterality: Left;  IVUS   LUMBAR FUSION  11/21/2000   L5-S1   LUMBAR SPINE SURGERY     x 2 others   PATCH ANGIOPLASTY Left 03/30/2019   Procedure: Patch Angioplasty of the Left Common Femoral Vein using Venosure Biologic patch;  Surgeon: Waynetta Sandy, MD;  Location: [place];  Service: Vascular;  Laterality: Left;   PERIPHERAL VASCULAR INTERVENTION Left 08/16/2019   Procedure: PERIPHERAL VASCULAR INTERVENTION;  Surgeon: Waynetta Sandy, MD;  Location: [place] [facility];  Service: Cardiovascular;  Laterality: Left;  common femoral/femoral vein stent   PERIPHERAL VASCULAR INTERVENTION Left 03/25/2022   Procedure: PERIPHERAL VASCULAR INTERVENTION;  Surgeon: Waynetta Sandy, MD;  Location: [place] [facility];  Service: Cardiovascular;  Laterality: Left;  COMMON FEMORAL VEIN   PERIPHERAL VASCULAR THROMBECTOMY Left 03/25/2022   Procedure: PERIPHERAL VASCULAR THROMBECTOMY;  Surgeon: Waynetta Sandy, MD;  Location: [place] [facility];  Service: Cardiovascular;  Laterality: Left;  EXTREMITY/IVC   TRIGGER FINGER RELEASE Right 12/01/2017   Procedure: RELEASE TRIGGER FINGER/A-1 PULLEY RIGHT THUMB;  Surgeon: Leanora Cover, MD;  Location: [place];  Service: Orthopedics;  Laterality: Right;   TRIGGER FINGER RELEASE Left 01/26/2018   Procedure: LEFT TRIGGER THUMB RELEASE;  Surgeon: Leanora Cover, MD;  Location: [place];  Service: Orthopedics;   Laterality: Left;   ULTRASOUND GUIDANCE FOR VASCULAR ACCESS Right 03/30/2019   Procedure: Ultrasound-guided cannulation right internal jugular vein;  Surgeon: Waynetta Sandy, MD;  Location: [facility];  Service: Vascular;  Laterality: Right;   Patient Active Problem List   Diagnosis Date Noted   Chronic pain of both knees 10/22/2021   Hematuria 10/01/2021   Recurrent UTI 10/01/2021   Dyspnea on exertion 10/01/2021   Iron deficiency anemia 04/18/2021   Nocturnal sleep-related eating disorder 01/01/2021   Vitamin D deficiency 01/01/2021   Arthritis of left knee 12/26/2020   S/P left knee arthroscopy 11/14/20  11/21/2020   Old complex tear of medial meniscus of left knee    Primary osteoarthritis of left knee 11/09/2020   Subacromial bursitis of right shoulder joint 11/09/2020   Chronic pain syndrome 04/24/2020   Pharmacologic therapy 04/24/2020   Disorder of skeletal system 04/24/2020   Problems influencing health status 04/24/2020   History of  thrombocytopenia 04/24/2020   Abnormal MRI, cervical spine (01/08/2017) 04/24/2020   Abnormal MRI, lumbar spine (05/11/2014) 04/24/2020   DDD (degenerative disc disease), cervical 04/24/2020   DDD (degenerative disc disease), lumbar 04/24/2020   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   Diabetic peripheral neuropathy ([place]) 04/24/2020   Neurogenic pain 04/24/2020   Chronic musculoskeletal pain 04/24/2020   Other proteinuria 03/30/2020   Myalgia 03/15/2020   Bilateral hand swelling 02/16/2020   Thrush 01/20/2020   Morbid obesity ([place]) 10/25/2019   History of back surgery 10/25/2019   Peripheral neuropathy 10/25/2019   Restless leg syndrome 10/25/2019   Chronic low back pain (Bilateral) w/ sciatica (Bilateral) 10/25/2019   Hypertension associated with diabetes ([place]) 10/25/2019   Type 2 diabetes mellitus with stage 3 chronic kidney disease, without long-term current use of insulin ([place]) 10/25/2019   Chronic anticoagulation (Coumadin)  10/25/2019   Hyperlipidemia associated with type 2 diabetes mellitus ([place]) 10/25/2019   CKD stage 3 due to type 2 diabetes mellitus ([place]) 10/25/2019   GERD (gastroesophageal reflux disease) 10/25/2019   Allergic rhinitis 10/25/2019   Chronic constipation 10/25/2019   Hypothyroidism    Normocytic anemia 09/15/2019   Iliac vein stenosis, 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   Chronic venous insufficiency 11/21/2017   Displacement of lumbar intervertebral disc without myelopathy 07/14/2017   Radicular low back pain 05/25/2014   Depression, major, recurrent ([place]) 06/17/2013   OCD (obsessive compulsive disorder) 11/20/2012   Insomnia due to mental disorder 09/25/2012   Bipolar 1 disorder ([place]) 09/25/2012   History of DVT (deep vein thrombosis) 08/18/2012    PCP: Lavon Paganini MD   REFERRING PROVIDER: Criselda Peaches, DPM   REFERRING DIAG: [phone] (ICD-10-CM) - Achilles tendinosis of left ankle   THERAPY DIAG:  Pain in left ankle and joints of left foot - Plan: PT plan of care cert/re-cert  Rationale for Evaluation and Treatment Rehabilitation  ONSET DATE: Chronic (2-3 years)   SUBJECTIVE:   SUBJECTIVE STATEMENT: Patient presents to therapy with complaint of LT ankle pain. She reports pain began insidiously about 2-3 years ago. She has not had imaging. She has had injections which did not help. She has been doing some home exercises from podiatry but these have not helped.   PERTINENT HISTORY: Lumbar surgery x 3, LT knee surgery, hx of blood clot  PAIN:  Are you having pain? Yes: NPRS scale: 7/10 Pain location: LT achilles  Pain description: "irritating"  Aggravating factors: walking, WB Relieving factors: Non WB  PRECAUTIONS: None  WEIGHT BEARING RESTRICTIONS No  FALLS:  Has patient fallen in last 6 months? No  LIVING ENVIRONMENT: Lives with: lives with their family and lives alone Lives in: House/apartment Stairs: Yes: External: 3 steps; on  right going up Has following equipment at home: Single point cane  OCCUPATION: Disability  PLOF: Independent with basic ADLs  PATIENT GOALS "Get through it"   OBJECTIVE:   DIAGNOSTIC FINDINGS: NA  PATIENT SURVEYS:  LEFS 25/80  COGNITION:  Overall cognitive status: Within functional limits for tasks assessed    EDEMA:  Min about LT achilles  PALPATION: Mod TTP about LT achilles insertion   LOWER EXTREMITY ROM:  Active ROM Right eval Left eval  Hip flexion    Hip extension    Hip abduction    Hip adduction    Hip internal rotation    Hip external rotation    Knee flexion    Knee extension    Ankle dorsiflexion  12  Ankle plantarflexion  40  Ankle inversion    Ankle eversion     (Blank rows = not tested)  LOWER EXTREMITY MMT:  MMT Right eval Left eval  Hip flexion    Hip extension    Hip abduction    Hip adduction    Hip internal rotation    Hip external rotation    Knee flexion    Knee extension    Ankle dorsiflexion  5  Ankle plantarflexion  4  Ankle inversion  5  Ankle eversion  5   (Blank rows = not tested)  FUNCTIONAL TESTS:  2 minute walk test: 465 feet   GAIT: No AD, decreased stride, decreased LT stance time initially, but improved with time   TODAY'S TREATMENT: 05/21/22  Eval Calf stretch at wall 30 sec  Banded ankle inversion/eversion BTB x10 Banded ankle DF eccentric BTB x10    PATIENT EDUCATION:  Education details: on eval findings, POC and HEP  Person educated: Patient Education method: Explanation Education comprehension: verbalized understanding   HOME EXERCISE PROGRAM: Access Code: [email or web address] URL: [email or web address] Date: 05/21/2022 Prepared by: Josue Hector  Exercises - Seated Eccentric Ankle Plantar Flexion with Resistance - Straight Leg  - 2-3 x daily - 7 x weekly - 3 sets - 10 reps - Seated Ankle Eversion with Resistance  - 2-3 x daily - 7 x weekly - 3 sets - 10 reps - Seated Ankle  Inversion with Resistance  - 2-3 x daily - 7 x weekly - 3 sets - 10 reps - Gastroc Stretch on Wall  - 2-3 x daily - 7 x weekly - 1 sets - 3 reps - 20-30 second hold  ASSESSMENT:  CLINICAL IMPRESSION: Patient is a 56 y.o. female who presents to physical therapy with complaint of LT ankle pain. Patient demonstrates muscle weakness, increased point tenderness, gait deviations and decreased activity tolerance which are likely contributing to symptoms of pain and are negatively impacting patient ability to perform ADLs and functional mobility tasks. Patient will benefit from skilled physical therapy services to address these deficits to reduce pain and improve level of function with ADLs and functional mobility tasks.    OBJECTIVE IMPAIRMENTS Abnormal gait, decreased activity tolerance, decreased balance, decreased mobility, difficulty walking, decreased strength, increased edema, increased fascial restrictions, impaired flexibility, improper body mechanics, and pain.   ACTIVITY LIMITATIONS standing, stairs, and locomotion level  PARTICIPATION LIMITATIONS: meal prep, cleaning, laundry, driving, shopping, community activity, and yard work  PERSONAL FACTORS Time since onset of injury/illness/exacerbation are also affecting patient's functional outcome.   REHAB POTENTIAL: Good  CLINICAL DECISION MAKING: Stable/uncomplicated  EVALUATION COMPLEXITY: Low   GOALS: SHORT TERM GOALS: Target date: 06/11/2022  Patient will be independent with initial HEP and self-management strategies to improve functional outcomes Baseline:  Goal status: INITIAL   LONG TERM GOALS: Target date: 07/02/2022  Patient will be independent with advanced HEP and self-management strategies to improve functional outcomes Baseline:  Goal status: INITIAL  2.  Patient will improve LEFS score by at least 9 points to indicate improvement in functional outcomes Baseline: 25/80 Goal status: INITIAL  3.  Patient will report  reduction of ankle pain to <3/10 for improved quality of life and ability to perform ADLs  Baseline: 7/10 Goal status: INITIAL    PLAN: PT FREQUENCY: 2x/week  PT DURATION: 6 weeks  PLANNED INTERVENTIONS: Therapeutic exercises, Therapeutic activity, Neuromuscular re-education, Balance training, Gait training, Patient/Family education, Joint manipulation, Joint mobilization, Stair training, Aquatic Therapy, Dry Needling, Electrical stimulation, Spinal manipulation, Spinal mobilization,  Cryotherapy, Moist heat, scar mobilization, Taping, Traction, Ultrasound, Biofeedback, Ionotophoresis '4mg'$ /ml Dexamethasone, and Manual therapy.   PLAN FOR NEXT SESSION: Review HEP. Progress ankle strength and stabilization exercises as tolerated. Manual as needed. Possible DN   10:31 AM, 05/21/22 Josue Hector PT DPT  Physical Therapist with [facility]  [phone]

## 2022-05-22 NOTE — Progress Notes (Signed)
Chief Complaint:   OBESITY Jane [name] is here to discuss her progress with her obesity treatment plan along with follow-up of her obesity related diagnoses. Jane [name] is on the Category 4 Plan and states she is following her eating plan approximately 50% of the time. Jane [name] states she is walking 10,000 steps,going to the gym and pool 45-60 minutes 2-7 times per week.  Today's visit was #: 83 Starting weight: 283 lbs Starting date: 03/29/2020 Today's weight: 246 lbs Today's date: 05/20/2022 Total lbs lost to date: 37 lbs Total lbs lost since last in-office visit: 1  Interim History: Jane [name] has had more blood clots found recently in places where stents are. She is on Coumadin because nothing else worked. She is feeling stressed and has done some emotional eating. She feels like she is "hitting a wall". She has an additional 21 lbs to lose by the end of Sept.  Subjective:   1. Type 2 diabetes mellitus with hyperglycemia, without long-term current use of insulin (HCC) Jane [name] is currently on Ozempic but still difficult to maintain carb intake. Her [name] was 5.8.  2. Other depression Jane [name] sees therapist every Tuesday am. She is on Prozac and Xanax. Denies suicidal ideas, and homicidal ideas. She has psychiatrist--Dr. Harrington Challenger. Her next appointment is Aug 28th.  Assessment/Plan:   1. Type 2 diabetes mellitus with hyperglycemia, without long-term current use of insulin (HCC) We will Increase/Refill Ozempic to 1 mg SubQ weekly for 1 month with 0 refills. (Patient may do 0.5 mg 2 times a week if 1 mg weekly is too much).  -Increase/Refill Semaglutide, 1 MG/DOSE, 4 MG/3ML SOPN; Inject 1 mg as directed once a week.  Dispense: 9 mL; Refill: 0  2. Other depression Jane [name] will follow up with therapist; continue with current medications.  3. Obesity with current BMI of 35.4 Jane [name] is currently in the action stage of change. As such, her goal is to continue with weight loss efforts. She has agreed to the  Category 4 Plan.   Exercise goals: All adults should avoid inactivity. Some physical activity is better than none, and adults who participate in any amount of physical activity gain some health benefits.  Behavioral modification strategies: increasing lean protein intake, meal planning and cooking strategies, better snacking choices, and emotional eating strategies.  Jane [name] has agreed to follow-up with our clinic in 5 weeks. She was informed of the importance of frequent follow-up visits to maximize her success with intensive lifestyle modifications for her multiple health conditions.   Objective:   Blood pressure 122/72, pulse 73, temperature 98 F (36.7 C), height '5\' 10"'$  (1.778 m), weight 246 lb (111.6 kg), SpO2 100 %. Body mass index is 35.3 kg/m.  General: Cooperative, alert, well developed, in no acute distress. HEENT: Conjunctivae and lids unremarkable. Cardiovascular: Regular rhythm.  Lungs: Normal work of breathing. Neurologic: No focal deficits.   Lab Results  Component Value Date   CREATININE 1.40 (H) 04/22/2022   BUN 23 04/22/2022   NA 143 04/22/2022   K 4.3 04/22/2022   CL 104 04/22/2022   CO2 25 04/22/2022   Lab Results  Component Value Date   ALT 21 04/22/2022   AST 16 04/22/2022   ALKPHOS 107 04/22/2022   BILITOT 0.2 04/22/2022   Lab Results  Component Value Date   HGBA1C 5.8 (A) 04/22/2022   HGBA1C 6.2 (H) 11/06/2021   HGBA1C 6.3 (H) 03/22/2021   HGBA1C 5.8 (A) 10/24/2020   HGBA1C 6.2 (H) 03/29/2020   Lab Results  Component Value Date   INSULIN 21.5 03/29/2020   Lab Results  Component Value Date   TSH 1.480 04/22/2022   Lab Results  Component Value Date   CHOL 175 04/22/2022   HDL 38 (L) 04/22/2022   LDLCALC 95 04/22/2022   TRIG 248 (H) 04/22/2022   CHOLHDL 4.6 (H) 04/22/2022   Lab Results  Component Value Date   VD25OH 47.10 02/21/2022   VD25OH 50.60 01/01/2021   VD25OH 45.30 10/17/2020   Lab Results  Component Value Date   WBC 5.2  02/21/2022   HGB 13.3 03/25/2022   HCT 39.0 03/25/2022   MCV 92.1 02/21/2022   PLT 152 02/21/2022   Lab Results  Component Value Date   IRON 87 02/21/2022   TIBC 300 02/21/2022   FERRITIN 133 02/21/2022   Attestation Statements:   Reviewed by clinician on day of visit: allergies, medications, problem list, medical history, surgical history, family history, social history, and previous encounter notes.  I, Elnora Morrison, RMA am acting as transcriptionist for Coralie Common, MD.  I have reviewed the above documentation for accuracy and completeness, and I agree with the above. - Coralie Common, MD

## 2022-05-23 ENCOUNTER — Telehealth (HOSPITAL_COMMUNITY): Payer: Self-pay | Admitting: Physical Therapy

## 2022-05-23 ENCOUNTER — Encounter (HOSPITAL_COMMUNITY): Payer: Medicaid Other | Admitting: Physical Therapy

## 2022-05-23 NOTE — Telephone Encounter (Signed)
7/20 s/w pt to cx today due to car not starting and 7/26 she has another MD apptment and will not be here

## 2022-05-24 ENCOUNTER — Ambulatory Visit (INDEPENDENT_AMBULATORY_CARE_PROVIDER_SITE_OTHER): Payer: Medicaid Other | Admitting: Neurosurgery

## 2022-05-24 DIAGNOSIS — R1031 Right lower quadrant pain: Secondary | ICD-10-CM

## 2022-05-24 NOTE — Progress Notes (Signed)
I spoke with Jane [name] at length regarding her right leg pain and MRI results. Her MRI why it does show some arthritic change in the spine does not should any significant stenosis or nerve compression that explains her pain in her right groin. We discussed that this could be due to hip pathology which her lumbar MRI did not evaluate her for. She would like referral to ortho for further evaluation of her hip. She will let our office know should she decide she wants to consider a SCS in the future for her chronic left radicular symptoms

## 2022-05-25 DIAGNOSIS — F3132 Bipolar disorder, current episode depressed, moderate: Secondary | ICD-10-CM | POA: Diagnosis not present

## 2022-05-25 IMAGING — DX DG CHEST 2V
2 series · 2 of 2 positions shown · non-contrast
Comparison: Chest radiograph dated 12/03/2020.

CLINICAL DATA: 54-year-old female with positive DW1P2-10.

EXAM:
CHEST - 2 VIEW

[chest pa]
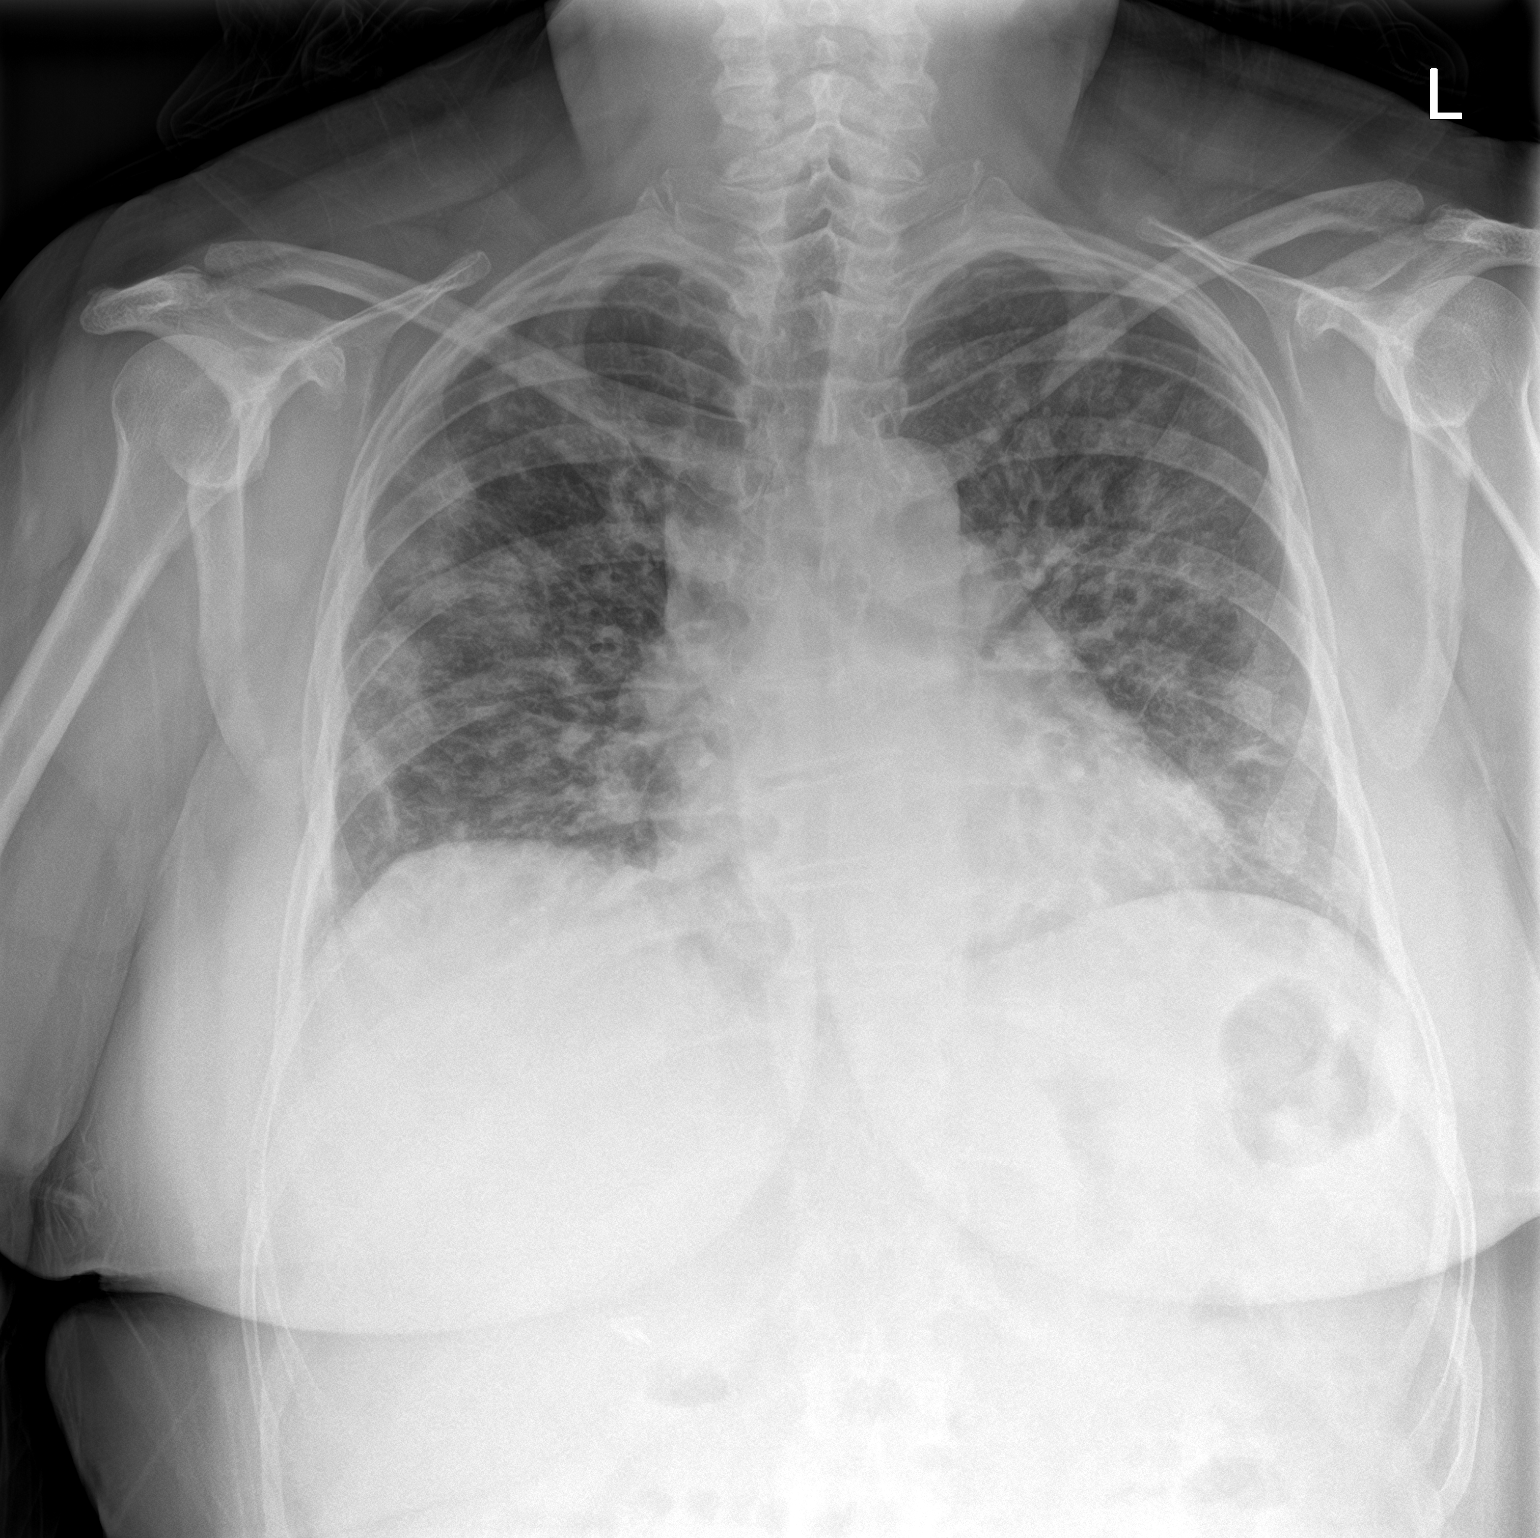

[chest lat]
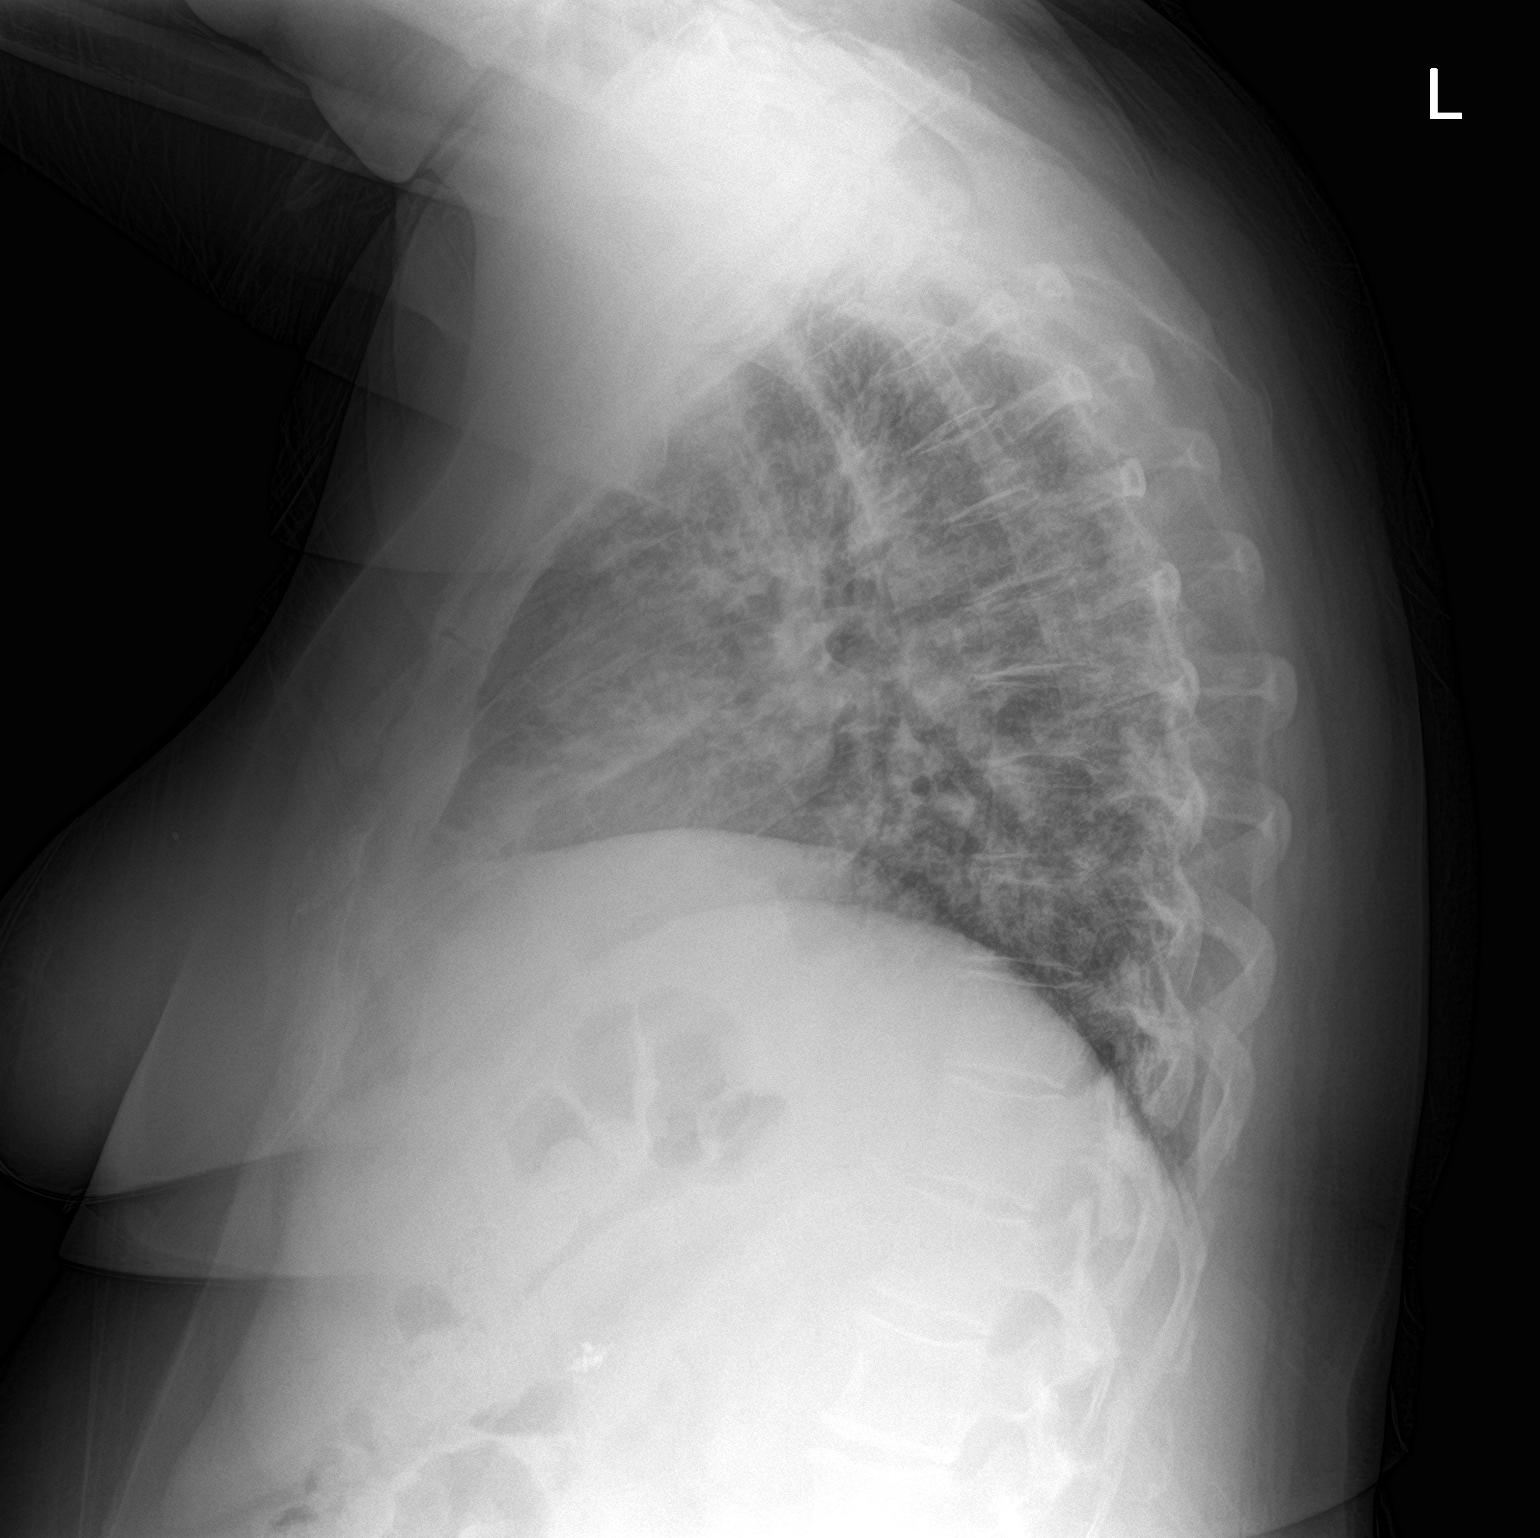

[2 of 2 positions shown; findings below may reference images not displayed]

FINDINGS: Bilateral streaky and confluent densities consistent with multilobar
pneumonia and in keeping with DW1P2-10. Overall similar degree of
pulmonary opacities compared to prior radiograph. No pleural
effusion pneumothorax. The cardiac silhouette is within limits. No
acute osseous pathology.
IMPRESSION: Findings consistent with multilobar pneumonia. Overall similar
appearance compared to prior radiograph.

## 2022-05-27 ENCOUNTER — Other Ambulatory Visit: Payer: Self-pay | Admitting: *Deleted

## 2022-05-27 NOTE — Patient Instructions (Signed)
Visit Information  [name] was given information about Medicaid Managed Care team care coordination services as a part of their Healthy [facility] Medicaid benefit. [name] verbally consented to engagement with the [facility] Managed Care team.   If you are experiencing a medical emergency, please call 911 or report to your local emergency department or urgent care.   If you have a non-emergency medical problem during routine business hours, please contact your provider's office and ask to speak with a nurse.   For questions related to your Healthy [facility] health plan, please call: [phone] or visit the homepage here: [email or web address]  If you would like to schedule transportation through your Healthy [facility] plan, please call the following number at least 2 days in advance of your appointment: [phone]  For information about your ride after you set it up, call Ride Assist at [phone]. Use this number to activate a Will Call pickup, or if your transportation is late for a scheduled pickup. Use this number, too, if you need to make a change or cancel a previously scheduled reservation.  If you need transportation services right away, call [phone]. The after-hours call center is staffed 24 hours to handle ride assistance and urgent reservation requests (including discharges) 365 days a year. Urgent trips include sick visits, hospital discharge requests and life-sustaining treatment.  Call the [facility] at [phone], at any time, 24 hours a day, 7 days a week. If you are in danger or need immediate medical attention call 911.  If you would like help to quit smoking, call 1-800-QUIT-NOW (331) 757-9947) OR Espaol: 1-855-Djelo-Ya (1-607-371-0626) o para ms informacin haga clic aqu or Text READY to 200-400 to register via text  [name] November,   Please see education materials related to HLD and DVT  provided by MyChart link.  Patient verbalizes understanding of instructions and care plan provided today and agrees to view in [place]. Active MyChart status and patient understanding of how to access instructions and care plan via MyChart confirmed with patient.     Telephone follow up appointment with Managed Medicaid care management team member scheduled for:07/29/22 @ [place] RN, [place] RN Care Coordinator   Following is a copy of your plan of care:  Care Plan : RN Care Manager Plan of Care  Updates made by Melissa Montane, RN since 05/27/2022 12:00 AM     Problem: Health Management needs related to DVT and HLD      Long-Range Goal: Development of Plan of Care to address Health Management needs related to DVT and HLD   Start Date: 05/27/2022  Expected End Date: 08/25/2022  Priority: High  Note:   Current Barriers:  Chronic Disease Management support and education needs related to HLD and DVT Financial Constraints.   RNCM Clinical Goal(s):  Patient will verbalize understanding of plan for management of HLD and DVT as evidenced by patient reports take all medications exactly as prescribed and will call provider for medication related questions as evidenced by documentation in EMR    attend all scheduled medical appointments: 7/26 with [facility], 8/7 for CT Venogram, 8/8 for PT, 8/16 with Dr. Donzetta Matters, 8/17 for Medication Management and 8/21 with [facility]  as evidenced by provider documentation        through collaboration with RN Care manager, provider, and care team.   Interventions: Inter-disciplinary care team collaboration (see longitudinal plan of care) Evaluation of current treatment plan related  to  self management and patient's adherence to plan as established by provider   DVT  (Status:  New goal.)  Short Term Goal Evaluation of current treatment plan related to  DVT , self-management and patient's adherence to  plan as established by provider. Discussed plans with patient for ongoing care management follow up and provided patient with direct contact information for care management team Provided education to patient re: DVT Reviewed medications with patient and discussed the importance of taking as instructed Reviewed scheduled/upcoming provider appointments including: 7/26 with [facility], 8/7 for CT Venogram, 8/16 with Dr. Donzetta Matters Advised to wear compression stocking as directed  SDOH Barriers (Status:  New goal.) Long Term Goal Patient interviewed and SDOH assessment performed Collaborated with BSW for food pantries and financial resources, scheduled on 05/30/22 @ 9am        SDOH Interventions    Flowsheet Row Most Recent Value  SDOH Interventions   Food Insecurity Interventions Other (Comment)  [Care Guide Referral for food resources]  Housing Interventions Intervention Not Indicated  Transportation Interventions Intervention Not Indicated      Hyperlipidemia:  (Status: New goal.) Long Term Goal  Lab Results  Component Value Date   CHOL 175 04/22/2022   HDL 38 (L) 04/22/2022   LDLCALC 95 04/22/2022   TRIG 248 (H) 04/22/2022   CHOLHDL 4.6 (H) 04/22/2022     Medication review performed; medication list updated in electronic medical record.  Provider established cholesterol goals reviewed; Provided HLD educational materials; Reviewed importance of limiting foods high in cholesterol; Reviewed exercise goals and target of 150 minutes per week; Assessed social determinant of health barriers;   Patient Goals/Self-Care Activities: Take medications as prescribed   Attend all scheduled provider appointments Call provider office for new concerns or questions  - call for medicine refill 2 or 3 days before it runs out - take all medications exactly as prescribed - call doctor when you experience any new symptoms

## 2022-05-27 NOTE — Patient Outreach (Signed)
Medicaid Managed Care   Nurse Care Manager Note  05/27/2022 Name:  Jane [name] MRN:  [id number] DOB:  December 30, 1965  Jane [name] is an 56 y.o. year old female who is [name] primary patient of [name], Jane Bucy, MD.  The [facility] Managed Care Coordination team was consulted for assistance with:    HLD DVT  [name] was given information about Medicaid Managed Care Coordination team services today. [name] Patient agreed to services and verbal consent obtained.  Engaged with patient by telephone for initial visit in response to provider referral for case management and/or care coordination services.   Assessments/Interventions:  Review of past medical history, allergies, medications, health status, including review of consultants reports, laboratory and other test data, was performed as part of comprehensive evaluation and provision of chronic care management services.  SDOH (Social Determinants of Health) assessments and interventions performed: SDOH Interventions    Flowsheet Row Most Recent Value  SDOH Interventions   Food Insecurity Interventions Other (Comment)  [Care Guide Referral for food resources]  Housing Interventions Intervention Not Indicated  Transportation Interventions Intervention Not Indicated       Care Plan  Allergies  Allergen Reactions   Aripiprazole Other (See Comments)    BECOMES  VIOLENT    Seroquel [Quetiapine Fumarate] Other (See Comments)    BECOMES VIOLENT   Chlorpromazine Other (See Comments)    SEVERE ANXIETY    Gabapentin Other (See Comments)    NIGHTMARES     Medications Reviewed Today     Reviewed by Jane Montane, RN (Registered Nurse) on 05/27/22 at 1439  Med List Status: <None>   Medication Order Taking? Sig Documenting Provider Last Dose Status Informant  ALPRAZolam (XANAX) 1 MG tablet [id number] Yes Take 1 tablet (1 mg total) by mouth 2 (two) times daily as needed. for anxiety Jane Spring, MD Taking Active    amitriptyline (ELAVIL) 50 MG tablet [id number] Yes Take 1 tablet (50 mg total) by mouth at bedtime. Dosage increase Jane Spring, MD Taking Active   aspirin EC 81 MG tablet [id number] Yes Take 81 mg by mouth daily. [provider] Taking Active Self  aspirin-acetaminophen-caffeine (EXCEDRIN MIGRAINE) [id number] MG tablet [id number] Yes Take 2 tablets by mouth every 6 (six) hours as needed for headache. [provider] Taking Active Self  atorvastatin (LIPITOR) 40 MG tablet [id number] Yes Take 1 tablet (40 mg total) by mouth daily. Jane Crews, MD Taking Active Self  cetirizine (ZYRTEC) 10 MG tablet [id number] Yes TAKE 1 TABLET BY MOUTH DAILY Jane [name] Jane Bucy, MD Taking Active Self  colchicine 0.6 MG tablet [id number] Yes Take 1.'2mg'$  (2 tablets) then 0.'6mg'$  (1 tablet) 1 hour after. Then, take 1 tablet every day for 7 days. Jane [name], DPM Taking Active            Med Note (Keniesha Adderly [name]   Mon May 27, 2022  2:35 PM) Taking as needed  FLUoxetine (PROZAC) 20 MG capsule [id number] Yes Take 1 capsule (20 mg total) by mouth daily. Take with 40 mg to equal 60 mg daily Jane Spring, MD Taking Active   FLUoxetine (PROZAC) 40 MG capsule [id number] Yes Take 1 capsule (40 mg total) by mouth daily. Take with 20 mg to equal 60 mg daily Jane Spring, MD Taking Active   GARLIC PO [id number] Yes Take 1 tablet by mouth daily. [provider] Taking Active Self  hydrochlorothiazide (HYDRODIURIL) 25 MG tablet [id number] Yes TAKE 1 TABLET BY MOUTH DAILY  Please schedule office visit BEFORE any future refills [name], Jane Bucy, MD Taking Active Self  HYDROcodone-acetaminophen [facility]) 10-325 MG tablet [id number] Yes Take 1 tablet by mouth every 6 (six) hours as needed. To last 30 days from fill date Jane Santa, MD Taking Active   levothyroxine (SYNTHROID) 125 MCG tablet [id number] Yes Take 125 mcg by mouth daily before breakfast. [provider] Taking Active Self   LINZESS 145 MCG CAPS capsule [id number] Yes TAKE ONE CAPSULE BY MOUTH DAILY [name], Jane Bucy, MD Taking Active Self  lisinopril (ZESTRIL) 5 MG tablet [id number] Yes Take 1 tablet (5 mg total) by mouth daily. Jane Crews, MD Taking Active   methocarbamol (ROBAXIN) 750 MG tablet [id number] Yes Take 1 tablet (750 mg total) by mouth 2 (two) times daily as needed for muscle spasms. Jane Santa, MD Taking Active   Multiple Vitamin (MULTIVITAMIN) capsule [id number] Yes Take 1 capsule by mouth daily. [provider] Taking Active Self  Omega-3 Fatty Acids (OMEGA-3 FISH OIL PO) [id number] Yes Take 1 capsule by mouth daily. [provider] Taking Active Self  pantoprazole (PROTONIX) 40 MG tablet [id number] Yes TAKE 1 TABLET BY MOUTH DAILY [name], Jane Bucy, MD Taking Active Self  potassium chloride (KLOR-CON M) 10 MEQ tablet [id number] Yes TAKE 1 TABLET BY MOUTH THREE TIMES DAILY Jane [name] Jane Bucy, MD Taking Active   pregabalin (LYRICA) 100 MG capsule [id number] Yes TAKE ONE CAPSULE BY MOUTH FOUR TIMES DAILY Jane [name] Jane Bucy, MD Taking Active   rOPINIRole (REQUIP) 2 MG tablet [id number] Yes TAKE 1 TABLET BY MOUTH TWICE DAILY Jane [name] Jane Bucy, MD Taking Active   Semaglutide, 1 MG/DOSE, 4 MG/3ML SOPN [id number] Yes Inject 1 mg as directed once [name] week. Jane Linden, MD Taking Active   Vibegron [facility]) 75 MG TABS [id number] Yes Take 75 mg by mouth daily. Jane Espy, MD Taking Active Self  warfarin (COUMADIN) 5 MG tablet [id number] Yes TAKE 1 AND 1/2 TABLETS BY MOUTH ON TUEDAYS AND THURSDAYS, THEN TAKE 1 TABLET DAILY ON ALL OTHER DAYS  Patient taking differently: Take 2.5-5 mg by mouth See admin instructions. Take 5 mg daily except on Wednesdays take 2.5 mg dose   Jane Sons, MD Taking Active Self           Med Note Jane [name], Jane [name]   Mon May 27, 2022  2:38 PM) [name]  Med List Note Hart Rochester,  RN 03/26/22 1120): Pain contract form and agreement signed 05/16/20 03/26/2022 UDS 12-25-21 PA for Hydrocodone done over the phone. DW  Fax sent to [place] [place]. 12/27/21 LP approved 12/28/21 / 06/26/22            Patient Active Problem List   Diagnosis Date Noted   Chronic pain of both knees 10/22/2021   Hematuria 10/01/2021   Recurrent UTI 10/01/2021   Dyspnea on exertion 10/01/2021   Iron deficiency anemia 04/18/2021   Nocturnal sleep-related eating disorder 01/01/2021   Vitamin D deficiency 01/01/2021   Arthritis of left knee 12/26/2020   S/P left knee arthroscopy 11/14/20  11/21/2020   Old complex tear of medial meniscus of left knee    Primary osteoarthritis of left knee 11/09/2020   Subacromial bursitis of right shoulder joint 11/09/2020   Chronic pain syndrome 04/24/2020   Pharmacologic therapy 04/24/2020   Disorder of skeletal system 04/24/2020   Problems influencing health status 04/24/2020   History of thrombocytopenia 04/24/2020  Abnormal MRI, cervical spine (01/08/2017) 04/24/2020   Abnormal MRI, lumbar spine (05/11/2014) 04/24/2020   DDD (degenerative disc disease), cervical 04/24/2020   DDD (degenerative disc disease), lumbar 04/24/2020   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   Diabetic peripheral neuropathy ([place]) 04/24/2020   Neurogenic pain 04/24/2020   Chronic musculoskeletal pain 04/24/2020   Other proteinuria 03/30/2020   Myalgia 03/15/2020   Bilateral hand swelling 02/16/2020   Thrush 01/20/2020   Morbid obesity ([place]) 10/25/2019   History of back surgery 10/25/2019   Peripheral neuropathy 10/25/2019   Restless leg syndrome 10/25/2019   Chronic low back pain (Bilateral) w/ sciatica (Bilateral) 10/25/2019   Hypertension associated with diabetes ([place]) 10/25/2019   Type 2 diabetes mellitus with stage 3 chronic kidney disease, without long-term current use of insulin ([place]) 10/25/2019   Chronic anticoagulation (Coumadin) 10/25/2019    Hyperlipidemia associated with type 2 diabetes mellitus ([place]) 10/25/2019   CKD stage 3 due to type 2 diabetes mellitus ([place]) 10/25/2019   GERD (gastroesophageal reflux disease) 10/25/2019   Allergic rhinitis 10/25/2019   Chronic constipation 10/25/2019   Hypothyroidism    Normocytic anemia 09/15/2019   Iliac vein stenosis, 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   Chronic venous insufficiency 11/21/2017   Displacement of lumbar intervertebral disc without myelopathy 07/14/2017   Radicular low back pain 05/25/2014   Depression, major, recurrent ([place]) 06/17/2013   OCD (obsessive compulsive disorder) 11/20/2012   Insomnia due to mental disorder 09/25/2012   Bipolar 1 disorder ([place]) 09/25/2012   History of DVT (deep vein thrombosis) 08/18/2012    Conditions to be addressed/monitored per PCP order:  HLD and DVT  Care Plan : RN Care Manager Plan of Care  Updates made by Jane Montane, RN since 05/27/2022 12:00 AM     Problem: Health Management needs related to DVT and HLD      Long-Range Goal: Development of Plan of Care to address Health Management needs related to DVT and HLD   Start Date: 05/27/2022  Expected End Date: 08/25/2022  Priority: High  Note:   Current Barriers:  Chronic Disease Management support and education needs related to HLD and DVT Financial Constraints.   RNCM Clinical Goal(s):  Patient will verbalize understanding of plan for management of HLD and DVT as evidenced by patient reports take all medications exactly as prescribed and will call provider for medication related questions as evidenced by documentation in EMR    attend all scheduled medical appointments: 7/26 with [facility], 8/7 for CT Venogram, 8/8 for PT, 8/16 with Dr. Donzetta Matters, 8/17 for Medication Management and 8/21 with [facility]  as evidenced by provider documentation        through collaboration with RN Care manager, provider, and care team.    Interventions: Inter-disciplinary care team collaboration (see longitudinal plan of care) Evaluation of current treatment plan related to  self management and patient's adherence to plan as established by provider   DVT  (Status:  New goal.)  Short Term Goal Evaluation of current treatment plan related to  DVT , self-management and patient's adherence to plan as established by provider. Discussed plans with patient for ongoing care management follow up and provided patient with direct contact information for care management team Provided education to patient re: DVT Reviewed medications with patient and discussed the importance of taking as instructed Reviewed scheduled/upcoming provider appointments including: 7/26 with [facility], 8/7 for CT Venogram, 8/16 with Dr. Donzetta Matters Advised to wear compression stocking as directed  SDOH  Barriers (Status:  New goal.) Long Term Goal Patient interviewed and SDOH assessment performed Collaborated with BSW for food pantries and financial resources, scheduled on 05/30/22 @ 9am        SDOH Interventions    Flowsheet Row Most Recent Value  SDOH Interventions   Food Insecurity Interventions Other (Comment)  [Care Guide Referral for food resources]  Housing Interventions Intervention Not Indicated  Transportation Interventions Intervention Not Indicated      Hyperlipidemia:  (Status: New goal.) Long Term Goal  Lab Results  Component Value Date   CHOL 175 04/22/2022   HDL 38 (L) 04/22/2022   LDLCALC 95 04/22/2022   TRIG 248 (H) 04/22/2022   CHOLHDL 4.6 (H) 04/22/2022     Medication review performed; medication list updated in electronic medical record.  Provider established cholesterol goals reviewed; Provided HLD educational materials; Reviewed importance of limiting foods high in cholesterol; Reviewed exercise goals and target of 150 minutes per week; Assessed social determinant of health barriers;   Patient Goals/Self-Care  Activities: Take medications as prescribed   Attend all scheduled provider appointments Call provider office for new concerns or questions  - call for medicine refill 2 or 3 days before it runs out - take all medications exactly as prescribed - call doctor when you experience any new symptoms       Follow Up:  Patient agrees to Care Plan and Follow-up.  Plan: The Managed Medicaid care management team will reach out to the patient again over the next 60 days.  Date/time of next scheduled RN care management/care coordination outreach:  07/29/22 @ [place] RN, [place] RN Care Coordinator

## 2022-05-28 ENCOUNTER — Encounter (HOSPITAL_COMMUNITY): Payer: Self-pay

## 2022-05-29 ENCOUNTER — Encounter (HOSPITAL_COMMUNITY): Payer: Medicaid Other | Admitting: Physical Therapy

## 2022-05-29 ENCOUNTER — Ambulatory Visit: Payer: Medicaid Other

## 2022-05-29 ENCOUNTER — Telehealth: Payer: Self-pay

## 2022-05-29 DIAGNOSIS — I10 Essential (primary) hypertension: Secondary | ICD-10-CM | POA: Diagnosis not present

## 2022-05-29 DIAGNOSIS — R079 Chest pain, unspecified: Secondary | ICD-10-CM | POA: Diagnosis not present

## 2022-05-29 DIAGNOSIS — R0789 Other chest pain: Secondary | ICD-10-CM | POA: Diagnosis not present

## 2022-05-29 DIAGNOSIS — E785 Hyperlipidemia, unspecified: Secondary | ICD-10-CM | POA: Diagnosis not present

## 2022-05-29 DIAGNOSIS — Z7984 Long term (current) use of oral hypoglycemic drugs: Secondary | ICD-10-CM | POA: Diagnosis not present

## 2022-05-29 DIAGNOSIS — Z7901 Long term (current) use of anticoagulants: Secondary | ICD-10-CM | POA: Diagnosis not present

## 2022-05-29 DIAGNOSIS — Z7982 Long term (current) use of aspirin: Secondary | ICD-10-CM | POA: Diagnosis not present

## 2022-05-29 DIAGNOSIS — Z79899 Other long term (current) drug therapy: Secondary | ICD-10-CM | POA: Diagnosis not present

## 2022-05-29 DIAGNOSIS — E119 Type 2 diabetes mellitus without complications: Secondary | ICD-10-CM | POA: Diagnosis not present

## 2022-05-29 NOTE — Telephone Encounter (Signed)
Copied from [place] [phone]. Topic: General - Call Back - No Documentation >> May 29, 2022  9:48 AM Sabas Sous wrote: Reason for CRM: Dr. Granville Lewis from [facility] ED called requesting to speak to PCP   Best contact: [phone] ask for Dr. Granville Lewis

## 2022-05-30 ENCOUNTER — Other Ambulatory Visit: Payer: Self-pay | Admitting: Family Medicine

## 2022-05-30 ENCOUNTER — Encounter: Payer: Self-pay | Admitting: Family Medicine

## 2022-05-30 ENCOUNTER — Encounter: Payer: Self-pay | Admitting: Vascular Surgery

## 2022-05-30 ENCOUNTER — Other Ambulatory Visit: Payer: Self-pay

## 2022-05-30 DIAGNOSIS — F3132 Bipolar disorder, current episode depressed, moderate: Secondary | ICD-10-CM | POA: Diagnosis not present

## 2022-05-30 NOTE — Patient Outreach (Signed)
Medicaid Managed Care Social Work Note  05/30/2022 Name:  Jane [name] MRN:  [id number] DOB:  1966/09/09  Jane [name] is an 56 y.o. year old female who is a primary patient of Bacigalupo, Dionne Bucy, MD.  The Medicaid Managed Care Coordination team was consulted for assistance with:  Community Resources   [name] was given information about Medicaid Managed Care Coordination team services today. Jane [name] Patient agreed to services and verbal consent obtained.  Engaged with patient  for by telephone forinitial visit in response to referral for case management and/or care coordination services.   Assessments/Interventions:  Review of past medical history, allergies, medications, health status, including review of consultants reports, laboratory and other test data, was performed as part of comprehensive evaluation and provision of chronic care management services.  SDOH: (Social Determinant of Health) assessments and interventions performed: BSW completed a telephone outreach with patient for community resources. Patient stated she receives 914 monthly in disability, receives foodstamps and her rent is 450/monthly. She is not behind on rent or utilities now but would like some resources. BSW will mail patient resources for rent, utilities and food pantries.   Advanced Directives Status:  Not addressed in this encounter.  Care Plan                 Allergies  Allergen Reactions   Aripiprazole Other (See Comments)    BECOMES  VIOLENT    Seroquel [Quetiapine Fumarate] Other (See Comments)    BECOMES VIOLENT   Chlorpromazine Other (See Comments)    SEVERE ANXIETY    Gabapentin Other (See Comments)    NIGHTMARES     Medications Reviewed Today     Reviewed by Melissa Montane, RN (Registered Nurse) on 05/27/22 at 1439  Med List Status: <None>   Medication Order Taking? Sig Documenting Provider Last Dose Status Informant  ALPRAZolam (XANAX) 1 MG tablet [id number] Yes  Take 1 tablet (1 mg total) by mouth 2 (two) times daily as needed. for anxiety Cloria Spring, MD Taking Active   amitriptyline (ELAVIL) 50 MG tablet [id number] Yes Take 1 tablet (50 mg total) by mouth at bedtime. Dosage increase Cloria Spring, MD Taking Active   aspirin EC 81 MG tablet [id number] Yes Take 81 mg by mouth daily. [provider] Taking Active Self  aspirin-acetaminophen-caffeine (EXCEDRIN MIGRAINE) [phone] MG tablet [id number] Yes Take 2 tablets by mouth every 6 (six) hours as needed for headache. [provider] Taking Active Self  atorvastatin (LIPITOR) 40 MG tablet [id number] Yes Take 1 tablet (40 mg total) by mouth daily. [name], MD Taking Active Self  cetirizine (ZYRTEC) 10 MG tablet [id number] Yes TAKE 1 TABLET BY MOUTH DAILY Brita Romp Dionne Bucy, MD Taking Active Self  colchicine 0.6 MG tablet [id number] Yes Take 1.'2mg'$  (2 tablets) then 0.'6mg'$  (1 tablet) 1 hour after. Then, take 1 tablet every day for 7 days. Criselda Peaches, DPM Taking Active            Med Note (ROBB, [name]   Mon May 27, 2022  2:35 PM) Taking as needed  FLUoxetine (PROZAC) 20 MG capsule [id number] Yes Take 1 capsule (20 mg total) by mouth daily. Take with 40 mg to equal 60 mg daily Cloria Spring, MD Taking Active   FLUoxetine (PROZAC) 40 MG capsule [id number] Yes Take 1 capsule (40 mg total) by mouth daily. Take with 20 mg to equal 60 mg daily Cloria Spring, MD Taking Active   GARLIC PO  [id number] Yes Take 1 tablet by mouth daily. [provider] Taking Active Self  hydrochlorothiazide (HYDRODIURIL) 25 MG tablet [id number] Yes TAKE 1 TABLET BY MOUTH DAILY Please schedule office visit BEFORE any future refills [name], MD Taking Active Self  HYDROcodone-acetaminophen [facility]) 10-325 MG tablet [id number] Yes Take 1 tablet by mouth every 6 (six) hours as needed. To last 30 days from fill date Gillis Santa, MD Taking Active   levothyroxine (SYNTHROID) 125 MCG  tablet [id number] Yes Take 125 mcg by mouth daily before breakfast. [provider] Taking Active Self  LINZESS 145 MCG CAPS capsule [id number] Yes TAKE ONE CAPSULE BY MOUTH DAILY Bacigalupo, Dionne Bucy, MD Taking Active Self  lisinopril (ZESTRIL) 5 MG tablet [id number] Yes Take 1 tablet (5 mg total) by mouth daily. [name], MD Taking Active   methocarbamol (ROBAXIN) 750 MG tablet [id number] Yes Take 1 tablet (750 mg total) by mouth 2 (two) times daily as needed for muscle spasms. Gillis Santa, MD Taking Active   Multiple Vitamin (MULTIVITAMIN) capsule [id number] Yes Take 1 capsule by mouth daily. [provider] Taking Active Self  Omega-3 Fatty Acids (OMEGA-3 FISH OIL PO) [id number] Yes Take 1 capsule by mouth daily. [provider] Taking Active Self  pantoprazole (PROTONIX) 40 MG tablet [id number] Yes TAKE 1 TABLET BY MOUTH DAILY Bacigalupo, Dionne Bucy, MD Taking Active Self  potassium chloride (KLOR-CON M) 10 MEQ tablet [id number] Yes TAKE 1 TABLET BY MOUTH THREE TIMES DAILY Brita Romp Dionne Bucy, MD Taking Active   pregabalin (LYRICA) 100 MG capsule [id number] Yes TAKE ONE CAPSULE BY MOUTH FOUR TIMES DAILY Brita Romp Dionne Bucy, MD Taking Active   rOPINIRole (REQUIP) 2 MG tablet [id number] Yes TAKE 1 TABLET BY MOUTH TWICE DAILY Brita Romp Dionne Bucy, MD Taking Active   Semaglutide, 1 MG/DOSE, 4 MG/3ML SOPN [id number] Yes Inject 1 mg as directed once a week. Laqueta Linden, MD Taking Active   Vibegron [facility]) 75 MG TABS [id number] Yes Take 75 mg by mouth daily. Hollice Espy, MD Taking Active Self  warfarin (COUMADIN) 5 MG tablet [id number] Yes TAKE 1 AND 1/2 TABLETS BY MOUTH ON TUEDAYS AND THURSDAYS, THEN TAKE 1 TABLET DAILY ON ALL OTHER DAYS  Patient taking differently: Take 2.5-5 mg by mouth See admin instructions. Take 5 mg daily except on Wednesdays take 2.5 mg dose   Birdie Sons, MD Taking Active Self           Med Note Thamas Jaegers, [name]   Mon May 27, 2022  2:38 PM) [name]  Med List Note Hart Rochester, RN 03/26/22 1120): Pain contract form and agreement signed 05/16/20 03/26/2022 UDS 12-25-21 PA for Hydrocodone done over the phone. DW  Fax sent to [place] [facility] [place]. 12/27/21 LP approved 12/28/21 / 06/26/22            Patient Active Problem List   Diagnosis Date Noted   Chronic pain of both knees 10/22/2021   Hematuria 10/01/2021   Recurrent UTI 10/01/2021   Dyspnea on exertion 10/01/2021   Iron deficiency anemia 04/18/2021   Nocturnal sleep-related eating disorder 01/01/2021   Vitamin D deficiency 01/01/2021   Arthritis of left knee 12/26/2020   S/P left knee arthroscopy 11/14/20  11/21/2020   Old complex tear of medial meniscus of left knee    Primary osteoarthritis of left knee 11/09/2020   Subacromial bursitis of right shoulder joint 11/09/2020   Chronic pain syndrome  04/24/2020   Pharmacologic therapy 04/24/2020   Disorder of skeletal system 04/24/2020   Problems influencing health status 04/24/2020   History of thrombocytopenia 04/24/2020   Abnormal MRI, cervical spine (01/08/2017) 04/24/2020   Abnormal MRI, lumbar spine (05/11/2014) 04/24/2020   DDD (degenerative disc disease), cervical 04/24/2020   DDD (degenerative disc disease), lumbar 04/24/2020   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   Diabetic peripheral neuropathy ([place]) 04/24/2020   Neurogenic pain 04/24/2020   Chronic musculoskeletal pain 04/24/2020   Other proteinuria 03/30/2020   Myalgia 03/15/2020   Bilateral hand swelling 02/16/2020   Thrush 01/20/2020   Morbid obesity ([place]) 10/25/2019   History of back surgery 10/25/2019   Peripheral neuropathy 10/25/2019   Restless leg syndrome 10/25/2019   Chronic low back pain (Bilateral) w/ sciatica (Bilateral) 10/25/2019   Hypertension associated with diabetes ([place]) 10/25/2019   Type 2 diabetes mellitus with stage 3 chronic kidney disease,  without long-term current use of insulin ([place]) 10/25/2019   Chronic anticoagulation (Coumadin) 10/25/2019   Hyperlipidemia associated with type 2 diabetes mellitus ([place]) 10/25/2019   CKD stage 3 due to type 2 diabetes mellitus ([place]) 10/25/2019   GERD (gastroesophageal reflux disease) 10/25/2019   Allergic rhinitis 10/25/2019   Chronic constipation 10/25/2019   Hypothyroidism    Normocytic anemia 09/15/2019   Iliac vein stenosis, 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   Follow up:  Patient agrees to Care Plan and Follow-up.  Plan: The Managed Medicaid care management team will reach out to the patient again over the next 10 days.  Date/time of next scheduled Social Work care management/care coordination outreach:  06/13/22  Mickel Fuchs, Arita Miss, [place] Medicaid Team  [phone]

## 2022-05-30 NOTE — Progress Notes (Signed)
Review of INR and dose adjustment; continue same pattern '5mg'$  x6 days; 2.5 mg x1 day with re-check next week given lovenox in ED.  Following adjustment guidelines from UTD. Adjustment in total mg of warfarin per week:  INR: <1.5            increase by 15% per week  INR 1.51-1.99    increase by 15% per week  INR 2.0-3.0         No change  INR 3.01-4.0      decrease by 10% per week  INR 4.01-4.99      hold one dose, decrease by 10% per week  INR 5-8.99         hold until INR 2-3, restart with decreased dose by 10% per week   Gwyneth Sprout, [place] [address] [place], [place] [address] [phone] (phone) [phone] (fax) [place]

## 2022-05-30 NOTE — Patient Instructions (Signed)
Visit Information  Ms. Payano was given information about Medicaid Managed Care team care coordination services as a part of their Healthy [facility] Medicaid benefit. Earnest Thalman verbally consented to engagement with the [facility] Managed Care team.   If you are experiencing a medical emergency, please call 911 or report to your local emergency department or urgent care.   If you have a non-emergency medical problem during routine business hours, please contact your provider's office and ask to speak with a nurse.   For questions related to your Healthy [facility] health plan, please call: [phone] or visit the homepage here: [email or web address]  If you would like to schedule transportation through your Healthy [facility] plan, please call the following number at least 2 days in advance of your appointment: [phone]  For information about your ride after you set it up, call Ride Assist at [phone]. Use this number to activate a Will Call pickup, or if your transportation is late for a scheduled pickup. Use this number, too, if you need to make a change or cancel a previously scheduled reservation.  If you need transportation services right away, call [phone]. The after-hours call center is staffed 24 hours to handle ride assistance and urgent reservation requests (including discharges) 365 days a year. Urgent trips include sick visits, hospital discharge requests and life-sustaining treatment.  Call the [place] at [phone], at any time, 24 hours a day, 7 days a week. If you are in danger or need immediate medical attention call 911.  If you would like help to quit smoking, call 1-800-QUIT-NOW 337-695-7962) OR Espaol: 1-855-Djelo-Ya (1-219-758-8325) o para ms informacin haga clic aqu or Text READY to 200-400 to register via text  [name] November - following are the goals we discussed in your visit today:    Goals Addressed   None      Social Worker will follow up in 10 days .   Mickel Fuchs, BSW, [place] Managed Medicaid Team  [phone]   Following is a copy of your plan of care:  There are no care plans that you recently modified to display for this patient.

## 2022-05-30 NOTE — Telephone Encounter (Signed)
Requested medication (s) are due for refill today: yes  Requested medication (s) are on the active medication list: yes  Last refill:  12/26/21 #90/1  Future visit scheduled: yes  Notes to clinic:  Unable to refill per protocol due to failed labs, no updated results.      Requested Prescriptions  Pending Prescriptions Disp Refills   cetirizine (ZYRTEC) 10 MG tablet [Pharmacy Med Name: cetirizine 10 mg tablet] 90 tablet 1    Sig: TAKE 1 TABLET BY MOUTH DAILY     Ear, Nose, and Throat:  Antihistamines 2 Failed - 05/30/2022  8:03 AM      Failed - Cr in normal range and within 360 days    Creatinine, Ser  Date Value Ref Range Status  04/22/2022 1.40 (H) 0.57 - 1.00 mg/dL Final         Passed - Valid encounter within last 12 months    Recent Outpatient Visits           1 month ago Type 2 diabetes mellitus with stage 3a chronic kidney disease, without long-term current use of insulin ([place])   [facility] [employer], Dionne Bucy, MD   3 months ago Diabetic peripheral neuropathy [facility])   [facility], Dionne Bucy, MD   7 months ago Encounter for annual physical exam   [facility] [place], Dionne Bucy, MD   8 months ago Dyspnea on exertion   [facility] [place], Dionne Bucy, MD   12 months ago Urinary frequency   [place], Dionne Bucy, MD       Future Appointments             In 4 months Bacigalupo, Dionne Bucy, MD [facility], [place]   In 5 months Brendolyn Patty, MD [place]   In 7 months [place], [place], [place]

## 2022-05-31 ENCOUNTER — Telehealth: Payer: Self-pay

## 2022-05-31 NOTE — Telephone Encounter (Signed)
Transition Care Management Unsuccessful Follow-up Telephone Call  Date of discharge and from where:  05/29/2022 from [facility]  Attempts:  1st Attempt  Reason for unsuccessful TCM follow-up call:  Left voice message

## 2022-06-01 DIAGNOSIS — F3132 Bipolar disorder, current episode depressed, moderate: Secondary | ICD-10-CM | POA: Diagnosis not present

## 2022-06-03 IMAGING — DX DG CHEST 2V
2 series · 2 of 2 positions shown · non-contrast
Comparison: 12/06/2020

CLINICAL DATA: Right pleuritic chest pain

EXAM:
CHEST - 2 VIEW

[chest pa]
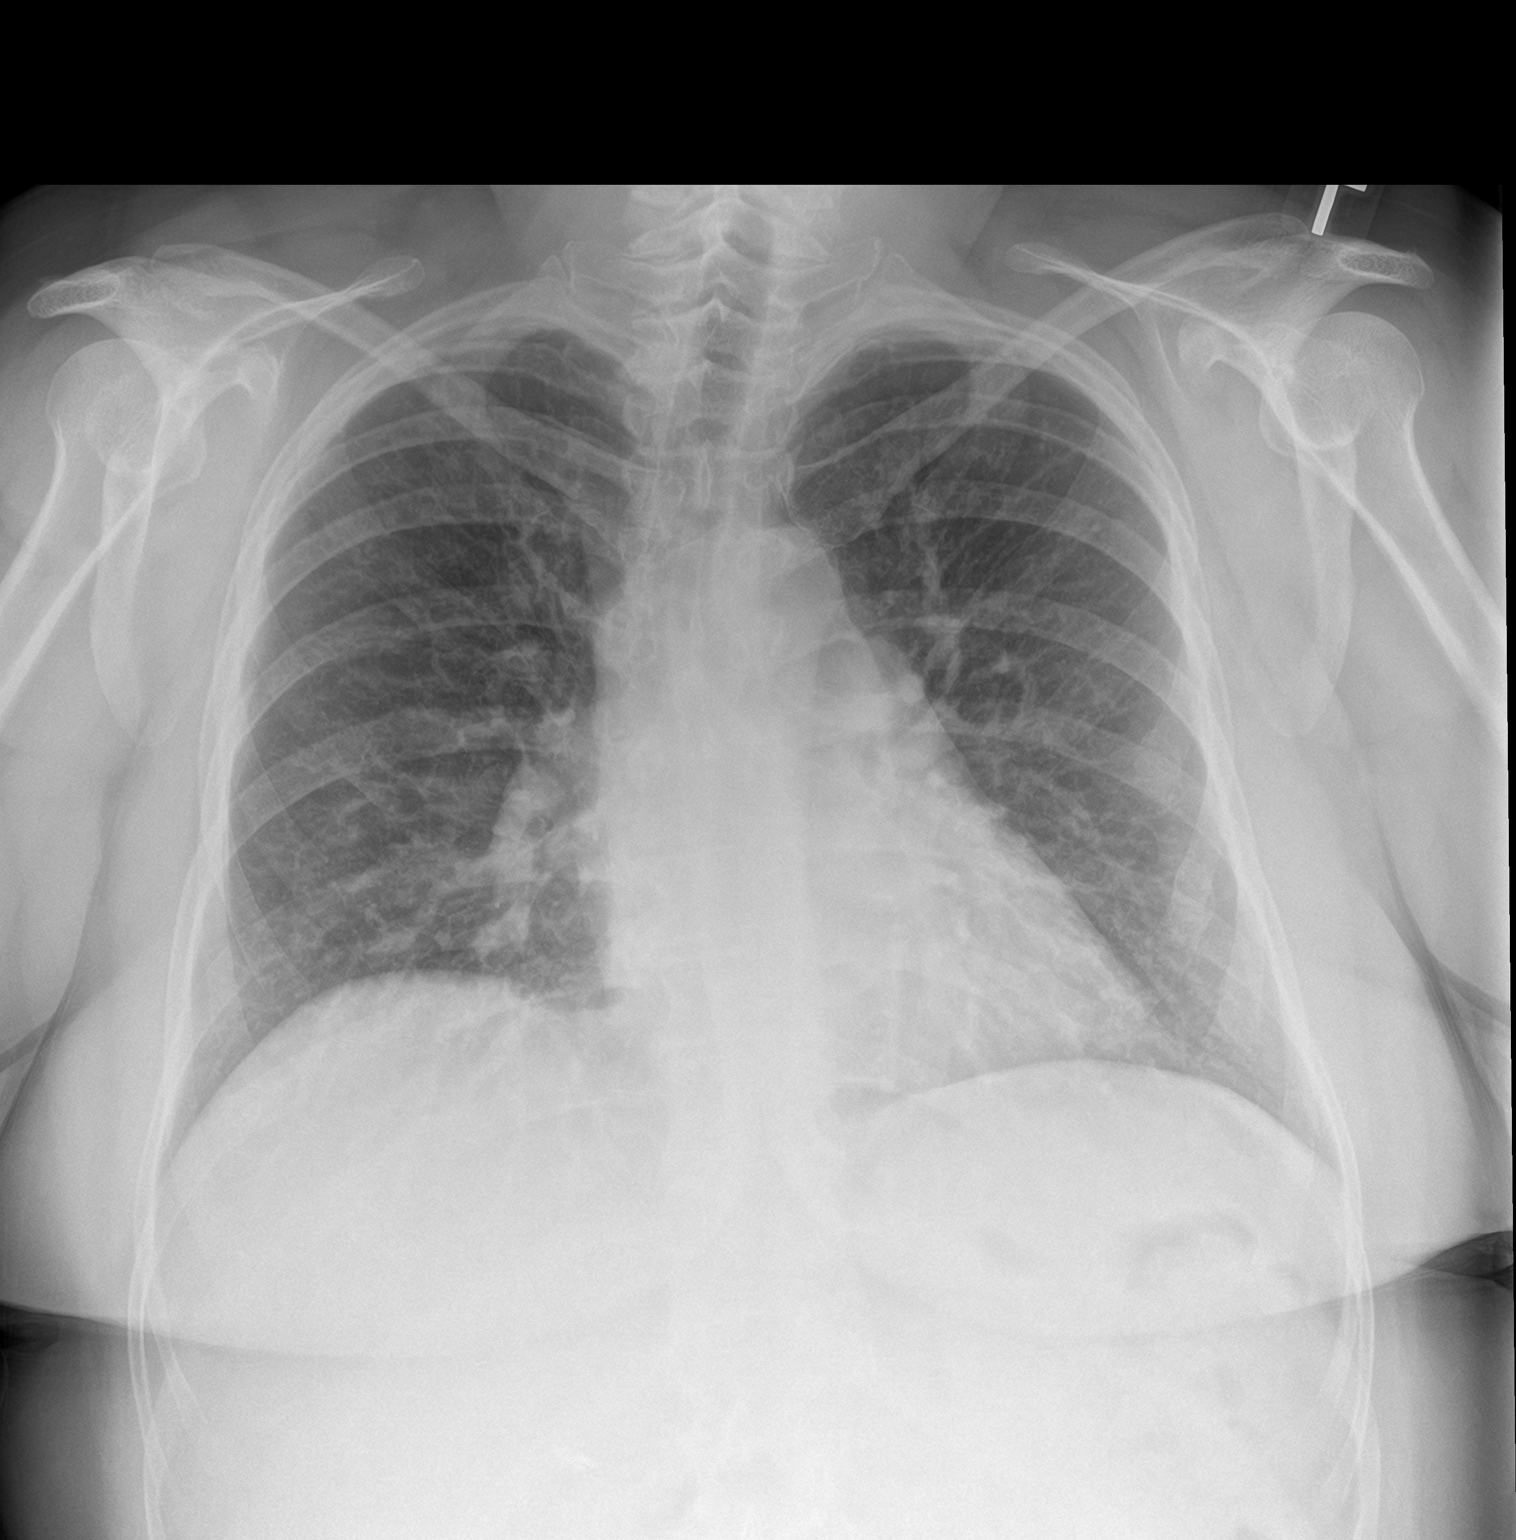

[chest lat]
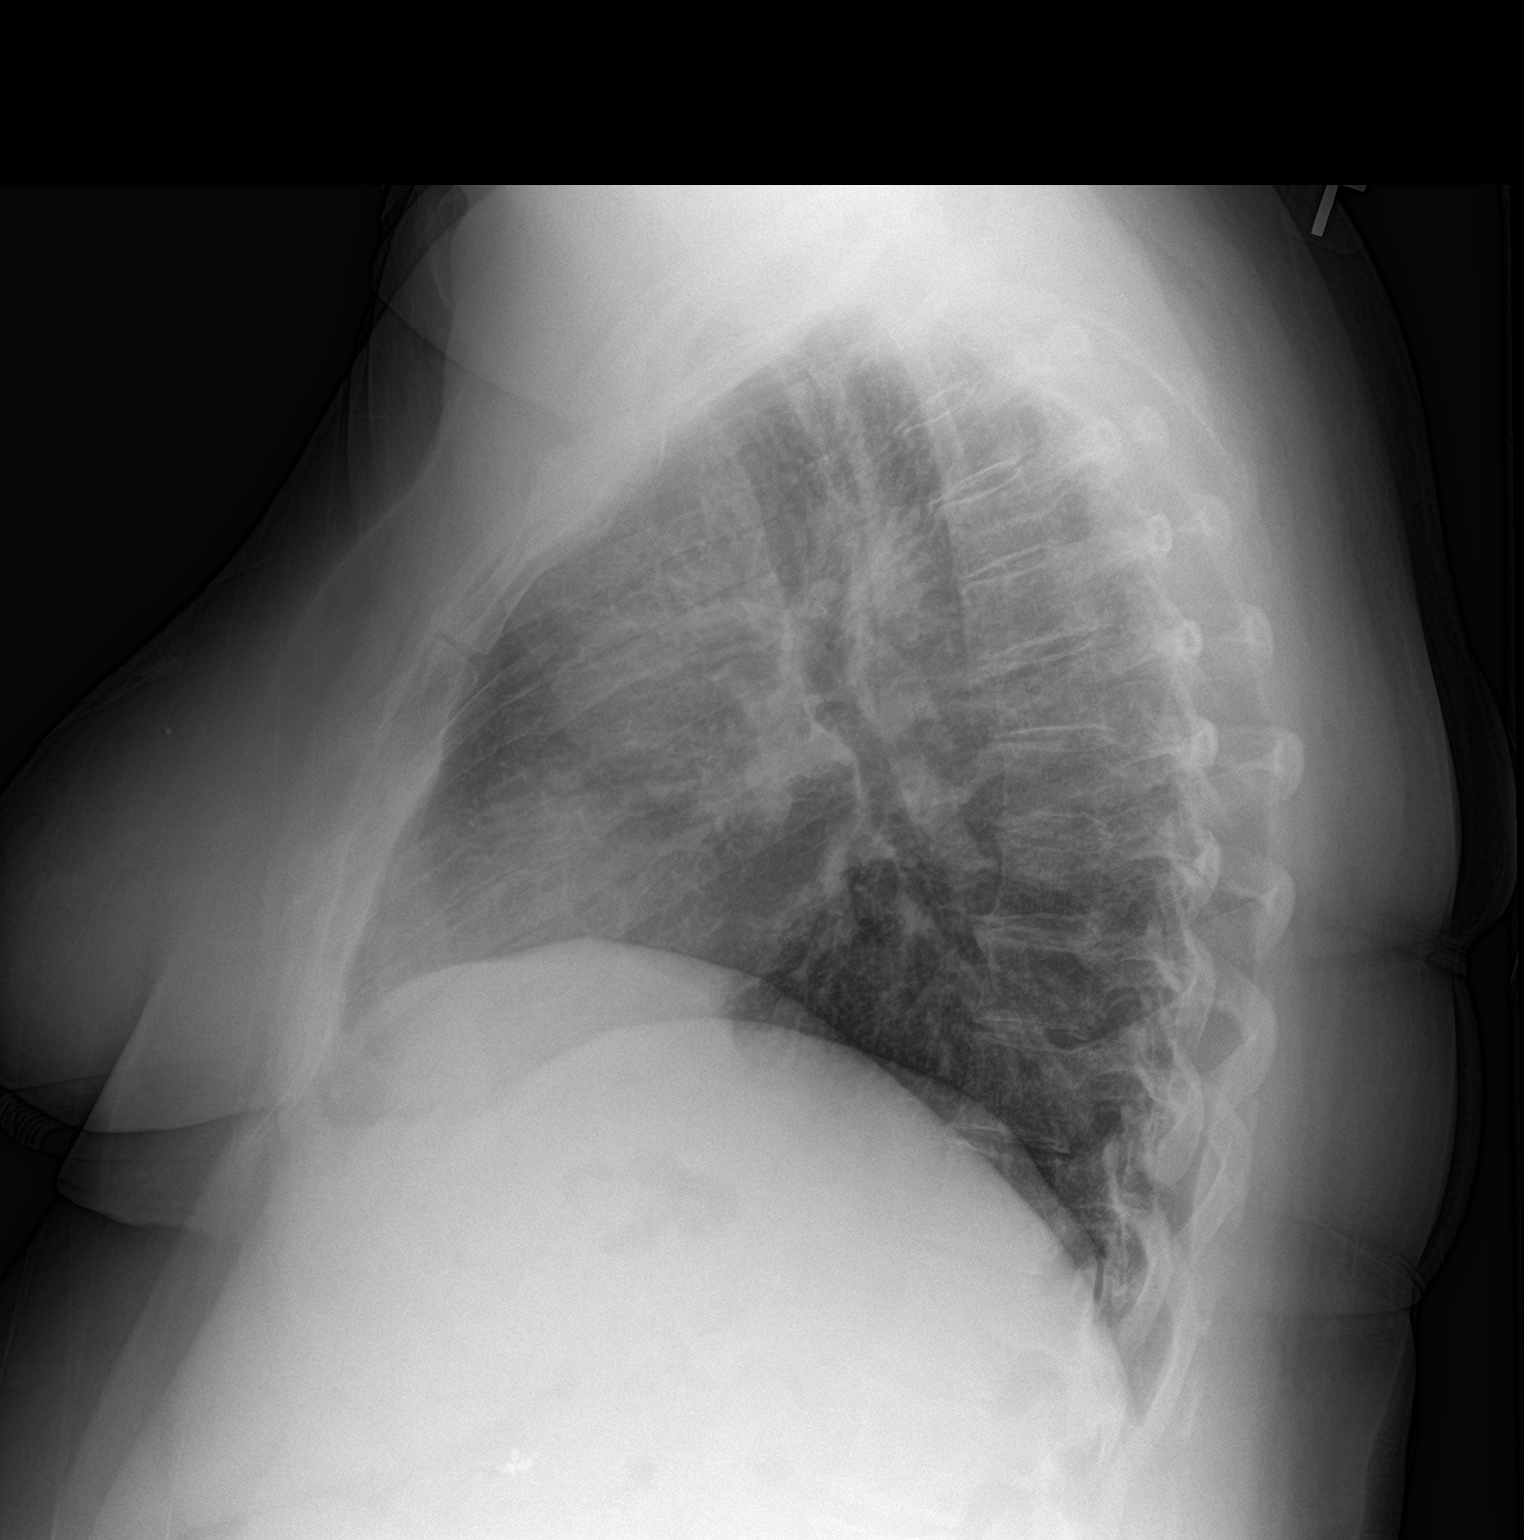

[2 of 2 positions shown; findings below may reference images not displayed]

FINDINGS: The lungs are symmetrically well expanded. Pulmonary insufflation
has improved since prior examination. Previously noted pulmonary
infiltrates have largely resolved. No pneumothorax or pleural
effusion. Cardiac size within normal limits. Pulmonary vascularity
is normal. Osseous structures are age-appropriate. No acute bone
abnormality.
IMPRESSION: Resolved pulmonary infiltrates. Improved pulmonary insufflation. No
radiographic evidence of acute cardiopulmonary disease.

## 2022-06-03 NOTE — Telephone Encounter (Signed)
Transition Care Management Unsuccessful Follow-up Telephone Call  Date of discharge and from where:  05/29/2022 from [facility]  Attempts:  2nd Attempt  Reason for unsuccessful TCM follow-up call:  Left voice message

## 2022-06-04 NOTE — Telephone Encounter (Signed)
Transition Care Management Follow-up Telephone Call Date of discharge and from where: 05/29/2022 from [facility] How have you been since you were released from the hospital? Patient stated that she is feeling well. Patient is following up with PCP office for an INR test tomorrow 06/05/2022 Any questions or concerns? No  Items Reviewed: Did the pt receive and understand the discharge instructions provided? Yes  Medications obtained and verified? Yes  Other? No  Any new allergies since your discharge? No  Dietary orders reviewed? No Do you have support at home? Yes   Functional Questionnaire: (I = Independent and D = Dependent) ADLs: I  Bathing/Dressing- I  Meal Prep- I  Eating- I  Maintaining continence- I  Transferring/Ambulation- I  Managing Meds- I   Follow up appointments reviewed:  PCP Hospital f/u appt confirmed? Yes  Scheduled to see INR recheck with [facility] on 06/05/2022 @ 10:10 am. [facility] f/u appt confirmed? No   Are transportation arrangements needed? No  If their condition worsens, is the pt aware to call PCP or go to the Emergency Dept.? Yes Was the patient provided with contact information for the PCP's office or ED? Yes Was to pt encouraged to call back with questions or concerns? Yes

## 2022-06-05 ENCOUNTER — Ambulatory Visit: Payer: Medicaid Other

## 2022-06-05 DIAGNOSIS — Z86718 Personal history of other venous thrombosis and embolism: Secondary | ICD-10-CM | POA: Diagnosis not present

## 2022-06-05 DIAGNOSIS — F3132 Bipolar disorder, current episode depressed, moderate: Secondary | ICD-10-CM | POA: Diagnosis not present

## 2022-06-05 LAB — POCT INR
INR: 1.6 — AB (ref 2.0–3.0)
PT: 19.2

## 2022-06-05 NOTE — Patient Instructions (Signed)
Description   Dx: DVT Current coumadin dose: '5mg'$  QD, except 2.'5mg'$  on Friday PT: 19.2 INR: 1.6 Today's changes: Today take one time dose of '10mg'$ , then tomorrow start taking '5mg'$  QD Recheck: 2 weeks

## 2022-06-08 DIAGNOSIS — F3132 Bipolar disorder, current episode depressed, moderate: Secondary | ICD-10-CM | POA: Diagnosis not present

## 2022-06-10 ENCOUNTER — Telehealth: Payer: Self-pay

## 2022-06-10 ENCOUNTER — Ambulatory Visit (HOSPITAL_COMMUNITY)
Admission: RE | Admit: 2022-06-10 | Discharge: 2022-06-10 | Disposition: A | Discharge status: Home / Self Care | Payer: Medicaid Other | Source: Ambulatory Visit | Attending: Vascular Surgery | Admitting: Vascular Surgery

## 2022-06-10 ENCOUNTER — Other Ambulatory Visit: Payer: Self-pay | Admitting: Vascular Surgery

## 2022-06-10 DIAGNOSIS — I871 Compression of vein: Secondary | ICD-10-CM | POA: Diagnosis present

## 2022-06-10 DIAGNOSIS — I739 Peripheral vascular disease, unspecified: Secondary | ICD-10-CM | POA: Diagnosis not present

## 2022-06-10 DIAGNOSIS — M47817 Spondylosis without myelopathy or radiculopathy, lumbosacral region: Secondary | ICD-10-CM | POA: Diagnosis not present

## 2022-06-10 DIAGNOSIS — I743 Embolism and thrombosis of arteries of the lower extremities: Secondary | ICD-10-CM | POA: Diagnosis not present

## 2022-06-10 DIAGNOSIS — I700 Atherosclerosis of aorta: Secondary | ICD-10-CM | POA: Diagnosis not present

## 2022-06-10 MED ORDER — IOHEXOL 350 MG/ML SOLN
100.0000 mL | Freq: Once | INTRAVENOUS | Status: AC | PRN
  Administered 2022-06-10: 80 mL via INTRAVENOUS

## 2022-06-10 NOTE — Telephone Encounter (Signed)
Jane [name] with [facility] called to give STAT results on CT venogram. Results reviewed, seen in Epic, Dr Donzetta Matters paged.

## 2022-06-11 ENCOUNTER — Encounter: Payer: Self-pay | Admitting: Family Medicine

## 2022-06-11 ENCOUNTER — Encounter (HOSPITAL_COMMUNITY): Payer: Medicaid Other | Admitting: Physical Therapy

## 2022-06-11 ENCOUNTER — Ambulatory Visit: Payer: Medicaid Other | Admitting: Family Medicine

## 2022-06-11 VITALS — BP 132/74 | HR 71 | Temp 98.3°F | Resp 16 | Ht 70.0 in | Wt 250.8 lb

## 2022-06-11 DIAGNOSIS — N390 Urinary tract infection, site not specified: Secondary | ICD-10-CM

## 2022-06-11 DIAGNOSIS — F331 Major depressive disorder, recurrent, moderate: Secondary | ICD-10-CM | POA: Diagnosis not present

## 2022-06-11 DIAGNOSIS — F319 Bipolar disorder, unspecified: Secondary | ICD-10-CM | POA: Diagnosis not present

## 2022-06-11 DIAGNOSIS — E1142 Type 2 diabetes mellitus with diabetic polyneuropathy: Secondary | ICD-10-CM

## 2022-06-11 DIAGNOSIS — I871 Compression of vein: Secondary | ICD-10-CM | POA: Diagnosis not present

## 2022-06-11 DIAGNOSIS — R300 Dysuria: Secondary | ICD-10-CM | POA: Diagnosis not present

## 2022-06-11 DIAGNOSIS — Z0289 Encounter for other administrative examinations: Secondary | ICD-10-CM | POA: Diagnosis not present

## 2022-06-11 DIAGNOSIS — F3132 Bipolar disorder, current episode depressed, moderate: Secondary | ICD-10-CM | POA: Diagnosis not present

## 2022-06-11 DIAGNOSIS — M5410 Radiculopathy, site unspecified: Secondary | ICD-10-CM

## 2022-06-11 DIAGNOSIS — F429 Obsessive-compulsive disorder, unspecified: Secondary | ICD-10-CM

## 2022-06-11 LAB — POCT URINALYSIS DIPSTICK
Bilirubin, UA: NEGATIVE
Glucose, UA: NEGATIVE
Ketones, UA: NEGATIVE
Nitrite, UA: NEGATIVE
Protein, UA: NEGATIVE
Spec Grav, UA: 1.015 (ref 1.010–1.025)
Urobilinogen, UA: 0.2 E.U./dL
pH, UA: 6 (ref 5.0–8.0)

## 2022-06-11 MED ORDER — SULFAMETHOXAZOLE-TRIMETHOPRIM 800-160 MG PO TABS: 1.0000 | ORAL_TABLET | Freq: Two times a day (BID) | ORAL | 0 refills | Status: AC

## 2022-06-11 NOTE — Progress Notes (Signed)
    SUBJECTIVE:   CHIEF COMPLAINT / HPI:   URINARY SYMPTOMS  Dysuria: no Urinary frequency: yes Urgency: yes Small volume voids: yes Foul odor: no Hematuria: no Abdominal pain: no Back pain:  R lower back pain Suprapubic pain/pressure: no Flank pain: yes Status: better/worse/stable Previous urinary tract infection: yes Recurrent urinary tract infection: yes. Sees Urology, last appt 01/2022 Vaginal discharge: no Treatments attempted: cranberry   FL2 paperwork - would like paperwork filled out to get assistance with housing - currently renting home, but becoming unable to pay rent on fixed SSI. Currently has stairs in home.  - disabled due to bipolar d/o, chronic back pain s/p multiple back surgeries - ambulates with cane sometimes - often requires help with dressing, bathing. Gets help from friend.  OBJECTIVE:   BP 132/74 (BP Location: Left Arm, Patient Position: Sitting, Cuff Size: Large)   Pulse 71   Temp 98.3 F (36.8 C) (Oral)   Resp 16   Ht '5\' 10"'$  (1.778 m)   Wt 250 lb 12.8 oz (113.8 kg)   LMP  (LMP Unknown)   BMI 35.99 kg/m   Gen: nontoxic appearing, in NAD Card: Reg rate Lungs: Comfortable WOB on RA Ext: WWP, no edema   ASSESSMENT/PLAN:   Recurrent UTI UA with leuks. Will send bactrim based on previous culture and intolerance to cipro. Will send urine for culture. Encouraged compliance with vaginal estrogen. Continue to follow with urology.    Encounter for paperwork FL2 form completed. Will fax once provided with number.   Myles Gip, DO

## 2022-06-11 NOTE — Assessment & Plan Note (Signed)
UA with leuks. Will send bactrim based on previous culture and intolerance to cipro. Will send urine for culture. Encouraged compliance with vaginal estrogen. Continue to follow with urology.

## 2022-06-12 ENCOUNTER — Encounter (INDEPENDENT_AMBULATORY_CARE_PROVIDER_SITE_OTHER): Payer: Self-pay

## 2022-06-12 LAB — URINALYSIS, MICROSCOPIC ONLY
Bacteria, UA: NONE SEEN
Casts: NONE SEEN /lpf
RBC, Urine: NONE SEEN /hpf (ref 0–2)

## 2022-06-12 LAB — SPECIMEN STATUS REPORT

## 2022-06-13 ENCOUNTER — Other Ambulatory Visit: Payer: Self-pay

## 2022-06-13 ENCOUNTER — Encounter (HOSPITAL_COMMUNITY): Payer: Medicaid Other | Admitting: Physical Therapy

## 2022-06-13 LAB — URINE CULTURE

## 2022-06-13 LAB — SPECIMEN STATUS REPORT

## 2022-06-13 NOTE — Patient Instructions (Signed)
Visit Information  [name] was given information about Medicaid Managed Care team care coordination services as a part of their Healthy [facility] Medicaid benefit. Jane [name] verbally consented to engagement with the [facility] Managed Care team.   If you are experiencing a medical emergency, please call 911 or report to your local emergency department or urgent care.   If you have a non-emergency medical problem during routine business hours, please contact your provider's office and ask to speak with a nurse.   For questions related to your Healthy [facility] health plan, please call: [phone] or visit the homepage here: [email or web address]  If you would like to schedule transportation through your Healthy [facility] plan, please call the following number at least 2 days in advance of your appointment: [phone]  For information about your ride after you set it up, call Ride Assist at [phone]. Use this number to activate a Will Call pickup, or if your transportation is late for a scheduled pickup. Use this number, too, if you need to make a change or cancel a previously scheduled reservation.  If you need transportation services right away, call [phone]. The after-hours call center is staffed 24 hours to handle ride assistance and urgent reservation requests (including discharges) 365 days a year. Urgent trips include sick visits, hospital discharge requests and life-sustaining treatment.  Call the [place] at [phone], at any time, 24 hours a day, 7 days a week. If you are in danger or need immediate medical attention call 911.  If you would like help to quit smoking, call 1-800-QUIT-NOW 303 692 7488) OR Espaol: 1-855-Djelo-Ya (8-341-962-2297) o para ms informacin haga clic aqu or Text READY to 200-400 to register via text  [name] - following are the goals we discussed in your visit today:    Goals Addressed   None       Social Worker will follow up in 30-45 days .   Jane [name], BSW, [place] Managed Medicaid Team  [phone]   Following is a copy of your plan of care:  There are no care plans that you recently modified to display for this patient.

## 2022-06-13 NOTE — Patient Outreach (Signed)
Medicaid Managed Care Social Work Note  06/13/2022 Name:  Jane [name] MRN:  [id number] DOB:  1966/01/01  Jane [name] is an 56 y.o. year old female who is a primary patient of Bacigalupo, Dionne Bucy, MD.  The Medicaid Managed Care Coordination team was consulted for assistance with:  Community Resources   [name] was given information about Medicaid Managed Care Coordination team services today. [name] Patient agreed to services and verbal consent obtained.  Engaged with patient  for by telephone forfollow up visit in response to referral for case management and/or care coordination services.   Assessments/Interventions:  Review of past medical history, allergies, medications, health status, including review of consultants reports, laboratory and other test data, was performed as part of comprehensive evaluation and provision of chronic care management services.  SDOH: (Social Determinant of Health) assessments and interventions performed: BSW completed a telephone outreach with patient. She stated she did receive the letter with resources and no other resources are needed at this time.   Advanced Directives Status:  Not addressed in this encounter.  Care Plan                 Allergies  Allergen Reactions   Aripiprazole Other (See Comments)    BECOMES  VIOLENT    Seroquel [Quetiapine Fumarate] Other (See Comments)    BECOMES VIOLENT   Chlorpromazine Other (See Comments)    SEVERE ANXIETY    Gabapentin Other (See Comments)    NIGHTMARES     Medications Reviewed Today     Reviewed by Doristine Devoid, CMA (Certified Medical Assistant) on 06/11/22 at 1346  Med List Status: <None>   Medication Order Taking? Sig Documenting Provider Last Dose Status Informant  ALPRAZolam (XANAX) 1 MG tablet [id number] Yes Take 1 tablet (1 mg total) by mouth 2 (two) times daily as needed. for anxiety Cloria Spring, MD Taking Active   amitriptyline (ELAVIL) 50 MG tablet  [id number] Yes Take 1 tablet (50 mg total) by mouth at bedtime. Dosage increase Cloria Spring, MD Taking Active   aspirin EC 81 MG tablet [id number] Yes Take 81 mg by mouth daily. [provider] Taking Active Self  aspirin-acetaminophen-caffeine (EXCEDRIN MIGRAINE) [phone] MG tablet [id number] Yes Take 2 tablets by mouth every 6 (six) hours as needed for headache. [provider] Taking Active Self  atorvastatin (LIPITOR) 40 MG tablet [id number] Yes Take 1 tablet (40 mg total) by mouth daily. [name], MD Taking Active Self  cetirizine (ZYRTEC) 10 MG tablet [id number] Yes TAKE 1 TABLET BY MOUTH DAILY Brita Romp Dionne Bucy, MD Taking Active   colchicine 0.6 MG tablet [id number] Yes Take 1.'2mg'$  (2 tablets) then 0.'6mg'$  (1 tablet) 1 hour after. Then, take 1 tablet every day for 7 days. Criselda Peaches, DPM Taking Active            Med Note (ROBB, [name]   Mon May 27, 2022  2:35 PM) Taking as needed  FLUoxetine (PROZAC) 20 MG capsule [id number] Yes Take 1 capsule (20 mg total) by mouth daily. Take with 40 mg to equal 60 mg daily Cloria Spring, MD Taking Active   FLUoxetine (PROZAC) 40 MG capsule [id number] Yes Take 1 capsule (40 mg total) by mouth daily. Take with 20 mg to equal 60 mg daily Cloria Spring, MD Taking Active   GARLIC PO [id number] Yes Take 1 tablet by mouth daily. [provider] Taking Active Self  hydrochlorothiazide (HYDRODIURIL) 25 MG tablet [id number] Yes TAKE  1 TABLET BY MOUTH DAILY Please schedule office visit BEFORE any future refills [name], MD Taking Active Self  HYDROcodone-acetaminophen [facility]) 10-325 MG tablet [id number] Yes Take 1 tablet by mouth every 6 (six) hours as needed. To last 30 days from fill date Gillis Santa, MD Taking Active   levothyroxine (SYNTHROID) 125 MCG tablet [id number] Yes Take 125 mcg by mouth daily before breakfast. [provider] Taking Active Self  LINZESS 145 MCG CAPS capsule [id number] Yes  TAKE ONE CAPSULE BY MOUTH DAILY Bacigalupo, Dionne Bucy, MD Taking Active Self  lisinopril (ZESTRIL) 5 MG tablet [id number] Yes Take 1 tablet (5 mg total) by mouth daily. [name], MD Taking Active   methocarbamol (ROBAXIN) 750 MG tablet [id number] Yes Take 1 tablet (750 mg total) by mouth 2 (two) times daily as needed for muscle spasms. Gillis Santa, MD Taking Active   Multiple Vitamin (MULTIVITAMIN) capsule [id number] Yes Take 1 capsule by mouth daily. [provider] Taking Active Self  Omega-3 Fatty Acids (OMEGA-3 FISH OIL PO) [id number] Yes Take 1 capsule by mouth daily. [provider] Taking Active Self  pantoprazole (PROTONIX) 40 MG tablet [id number] Yes TAKE 1 TABLET BY MOUTH DAILY Bacigalupo, Dionne Bucy, MD Taking Active Self  potassium chloride (KLOR-CON M) 10 MEQ tablet [id number] Yes TAKE 1 TABLET BY MOUTH THREE TIMES DAILY Brita Romp Dionne Bucy, MD Taking Active   pregabalin (LYRICA) 100 MG capsule [id number] Yes TAKE ONE CAPSULE BY MOUTH FOUR TIMES DAILY Brita Romp Dionne Bucy, MD Taking Active   rOPINIRole (REQUIP) 2 MG tablet [id number] Yes TAKE 1 TABLET BY MOUTH TWICE DAILY Brita Romp Dionne Bucy, MD Taking Active   Semaglutide, 1 MG/DOSE, 4 MG/3ML SOPN [id number] Yes Inject 1 mg as directed once a week. Laqueta Linden, MD Taking Active   Vibegron [facility] 75 MG TABS [id number] Yes Take 75 mg by mouth daily. Hollice Espy, MD Taking Active Self  warfarin (COUMADIN) 5 MG tablet [id number] Yes TAKE 1 AND 1/2 TABLETS BY MOUTH ON TUEDAYS AND THURSDAYS, THEN TAKE 1 TABLET DAILY ON ALL OTHER DAYS  Patient taking differently: Take 2.5-5 mg by mouth See admin instructions. Take 5 mg daily except on Wednesdays take 2.5 mg dose   Birdie Sons, MD Taking Active Self           Med Note Thamas Jaegers, [name]   Mon May 27, 2022  2:38 PM) [name]  Med List Note Hart Rochester, RN 03/26/22 1120): Pain contract form and  agreement signed 05/16/20 03/26/2022 UDS 12-25-21 PA for Hydrocodone done over the phone. DW  Fax sent to [facility] [place]. 12/27/21 LP approved 12/28/21 / 06/26/22            Patient Active Problem List   Diagnosis Date Noted   Chronic pain of both knees 10/22/2021   Hematuria 10/01/2021   Recurrent UTI 10/01/2021   Dyspnea on exertion 10/01/2021   Iron deficiency anemia 04/18/2021   Nocturnal sleep-related eating disorder 01/01/2021   Vitamin D deficiency 01/01/2021   Arthritis of left knee 12/26/2020   S/P left knee arthroscopy 11/14/20  11/21/2020   Old complex tear of medial meniscus of left knee    Primary osteoarthritis of left knee 11/09/2020   Subacromial bursitis of right shoulder joint 11/09/2020   Chronic pain syndrome 04/24/2020   Pharmacologic therapy 04/24/2020   Disorder of skeletal system 04/24/2020   Problems influencing health status 04/24/2020   History  of thrombocytopenia 04/24/2020   Abnormal MRI, cervical spine (01/08/2017) 04/24/2020   Abnormal MRI, lumbar spine (05/11/2014) 04/24/2020   DDD (degenerative disc disease), cervical 04/24/2020   DDD (degenerative disc disease), lumbar 04/24/2020   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   Diabetic peripheral neuropathy ([employer]) 04/24/2020   Neurogenic pain 04/24/2020   Chronic musculoskeletal pain 04/24/2020   Other proteinuria 03/30/2020   Myalgia 03/15/2020   Bilateral hand swelling 02/16/2020   Thrush 01/20/2020   Morbid obesity ([place]) 10/25/2019   History of back surgery 10/25/2019   Peripheral neuropathy 10/25/2019   Restless leg syndrome 10/25/2019   Chronic low back pain (Bilateral) w/ sciatica (Bilateral) 10/25/2019   Hypertension associated with diabetes ([place]) 10/25/2019   Type 2 diabetes mellitus with stage 3 chronic kidney disease, without long-term current use of insulin ([place]) 10/25/2019   Chronic anticoagulation (Coumadin) 10/25/2019   Hyperlipidemia associated with type 2 diabetes  mellitus ([place]) 10/25/2019   CKD stage 3 due to type 2 diabetes mellitus ([place]) 10/25/2019   GERD (gastroesophageal reflux disease) 10/25/2019   Allergic rhinitis 10/25/2019   Chronic constipation 10/25/2019   Hypothyroidism    Normocytic anemia 09/15/2019   Iliac vein stenosis, 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   Follow up:  Patient agrees to Care Plan and Follow-up.  Plan: The Managed Medicaid care management team will reach out to the patient again over the next 30-45 days.  Date/time of next scheduled Social Work care management/care coordination outreach:  07/19/22  Mickel Fuchs, Arita Miss, [place] Medicaid Team  [phone]

## 2022-06-14 ENCOUNTER — Encounter (HOSPITAL_COMMUNITY): Payer: Medicaid Other

## 2022-06-15 DIAGNOSIS — F3132 Bipolar disorder, current episode depressed, moderate: Secondary | ICD-10-CM | POA: Diagnosis not present

## 2022-06-19 ENCOUNTER — Ambulatory Visit: Payer: Medicaid Other | Admitting: Vascular Surgery

## 2022-06-19 ENCOUNTER — Ambulatory Visit: Payer: Self-pay

## 2022-06-19 ENCOUNTER — Other Ambulatory Visit (HOSPITAL_COMMUNITY): Payer: Self-pay | Admitting: Psychiatry

## 2022-06-19 ENCOUNTER — Encounter: Payer: Self-pay | Admitting: Vascular Surgery

## 2022-06-19 VITALS — BP 122/70 | HR 67 | Temp 97.9°F | Resp 20 | Ht 70.0 in | Wt 251.0 lb

## 2022-06-19 DIAGNOSIS — I871 Compression of vein: Secondary | ICD-10-CM | POA: Diagnosis not present

## 2022-06-19 NOTE — Telephone Encounter (Signed)
  Chief Complaint: Dizziness, ear irritation, Metallic taste in mouth and dry mouth, possible fever Symptoms: ibid Frequency: This afternoon Pertinent Negatives: Patient denies SOB Disposition: '[]'$ ED /'[]'$ Urgent Care (no appt availability in office) / '[x]'$ Appointment(In office/virtual)/ '[]'$  [place] [facility]/ '[]'$ Home Care/ '[]'$ Refused Recommended Disposition /'[]'$ [facility]/ '[]'$  Follow-up with PCP Additional Notes: PT has complaints of mild dizziness, left ear  and throat irritation, dry metallic taste in mouth and possible fever, and Sinus issues. Pt does not have a thermometer so is unsure if she has a fever.   Summary: dizzines, metallic taste and dry mouth and irritation from ear to throat.   Patient called in has dizziness, metallic taste and dry mouth and irritation from ear to throat.      Reason for Disposition  [1] MODERATE dizziness (e.g., interferes with normal activities) AND [2] has NOT been evaluated by doctor (or NP/PA) for this  (Exception: Dizziness caused by heat exposure, sudden standing, or poor fluid intake.)  Answer Assessment - Initial Assessment Questions 1. DESCRIPTION: "Describe your dizziness."     Head feels light going in circles 2. LIGHTHEADED: "Do you feel lightheaded?" (e.g., somewhat faint, woozy, weak upon standing)     Started this afternoon 3. VERTIGO: "Do you feel like either you or the room is spinning or tilting?" (i.e. vertigo)     no 4. SEVERITY: "How bad is it?"  "Do you feel like you are going to faint?" "Can you stand and walk?"   - MILD: Feels slightly dizzy, but walking normally.   - MODERATE: Feels unsteady when walking, but not falling; interferes with normal activities (e.g., school, work).   - SEVERE: Unable to walk without falling, or requires assistance to walk without falling; feels like passing out now.      mild 5. ONSET:  "When did the dizziness begin?"     This afternoon 6. AGGRAVATING FACTORS: "Does anything make it  worse?" (e.g., standing, change in head position)     no 7. HEART RATE: "Can you tell me your heart rate?" "How many beats in 15 seconds?"  (Note: not all patients can do this)       no 8. CAUSE: "What do you think is causing the dizziness?"     No - Sinus infection 9. RECURRENT SYMPTOM: "Have you had dizziness before?" If Yes, ask: "When was the last time?" "What happened that time?"     no 10. OTHER SYMPTOMS: "Do you have any other symptoms?" (e.g., fever, chest pain, vomiting, diarrhea, bleeding)       Dry metallic taste in mouth - feels warm to the touch 11. PREGNANCY: "Is there any chance you are pregnant?" "When was your last menstrual period?"  Protocols used: Dizziness - Lightheadedness-A-AH

## 2022-06-19 NOTE — Progress Notes (Signed)
Patient ID: Jane [name], female   DOB: 05/05/1966, 56 y.o.   MRN: [id number]  Reason for Consult: Follow-up   Referred by [name], MD  Subjective:     HPI:  Jane [name] is a 56 y.o. 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  She is intermittently wearing compression stockings she is working out in the pool this summer.  She continues to have back pain radiating down both legs.  She remains on Coumadin after failing DOAC therapy previously.  She states that her leg swells worse throughout the day but her skin is healthy denies any ulceration or skin changes recently.  Past Medical History:  Diagnosis Date   ADD (attention deficit disorder)    Anxiety    Back pain    Bilateral swelling of feet    Bipolar 1 disorder (HCC)    Bipolar 1 disorder (HCC)    Bipolar disorder (HCC)    Chewing difficulty    Chronic fatigue syndrome    Chronic kidney disease    Stage 3 kidney disease;dx by Dr. Sinda Du.    Constipation    Depression    Diabetes mellitus without complication (HCC)    diet controlled   Dyspnea    with exertion   GERD (gastroesophageal reflux disease)    Headache    migraines   High cholesterol    History of blood clots    History of DVT (deep vein thrombosis)    left leg   Hypertension    states under control with meds., has been on med. x 2 years   Hypothyroidism    Joint pain    Neuropathy    Obsessive-compulsive disorder    Peripheral vascular disease ([place])    Prediabetes    Respiratory failure requiring intubation ([place])    Restless leg syndrome    Shortness of breath    Sleep apnea    Thrombocytopenia ([place]) 09/15/2019   Trigger thumb of left hand 01/2018   Trigger thumb of right hand    Family History  Problem Relation Age of Onset   Heart  disease Mother    Hyperlipidemia Mother    Hypertension Mother    Bipolar disorder Mother    Stroke Mother    Depression Mother    Sleep apnea Mother    Obesity Mother    Diabetes Father    Heart disease Father    Hyperlipidemia Father    Hypertension Father    Sleep apnea Father    Obesity Father    Drug abuse Daughter    ADD / ADHD Daughter    Drug abuse Daughter    Anxiety disorder Daughter    Bipolar disorder Daughter    Hypertension Sister    Hypertension Brother    Hyperlipidemia Brother    Heart disease Brother    Bipolar disorder Maternal Aunt    Suicidality Maternal Aunt    Past Surgical History:  Procedure Laterality Date   ABDOMINAL HYSTERECTOMY  06/2016   complete   APPLICATION OF WOUND VAC Left 04/27/2019   Procedure: APPLICATION OF WOUND VAC LEFT GROIN;  Surgeon: Waynetta Sandy, MD;  Location: [place];  Service: Vascular;  Laterality: Left;   AV FISTULA PLACEMENT Left 11/24/2018   Procedure: ARTERIOVENOUS (AV) FISTULA CREATION LEFT SFA TO LEFT FEMORAL VEIN;  Surgeon: Waynetta Sandy,  MD;  Location: [facility];  Service: Vascular;  Laterality: Left;   BACK SURGERY     CHOLECYSTECTOMY     COLONOSCOPY WITH PROPOFOL N/A 11/22/2019   Procedure: COLONOSCOPY WITH PROPOFOL;  Surgeon: [name], MD;  Location: [facility] ENDOSCOPY;  Service: Gastroenterology;  Laterality: N/A;   FEMORAL ARTERY EXPLORATION  03/30/2019   Procedure: Left Common Femoral Artery and Vein Exploration;  Surgeon: Waynetta Sandy, MD;  Location: [place];  Service: Vascular;;   FEMORAL-FEMORAL BYPASS GRAFT Left 11/24/2018   Procedure: BYPASS GRAFT FEMORAL-FEMORAL VENOUS LEFT TO RIGHT PALMA PROCEDURE USING CRYOVEIN;  Surgeon: Waynetta Sandy, MD;  Location: [place];  Service: Vascular;  Laterality: Left;   GROIN DEBRIDEMENT Left 04/27/2019   Procedure: GROIN DEBRIDEMENT;  Surgeon: Waynetta Sandy, MD;  Location: [place];  Service: Vascular;  Laterality: Left;    INSERTION OF ILIAC STENT  03/30/2019   Procedure: Stent of left common, external iliac veins and left common femoral vein;  Surgeon: Waynetta Sandy, MD;  Location: [place];  Service: Vascular;;   KNEE ARTHROSCOPY WITH MENISCAL REPAIR Left 11/14/2020   Procedure: LEFT KNEE ARTHROSCOPY WITH PARTIAL MEDIAL MENISCECTOMY;  Surgeon: Carole Civil, MD;  Location: [facility];  Service: Orthopedics;  Laterality: Left;   LOWER EXTREMITY VENOGRAPHY N/A 08/17/2018   Procedure: LOWER EXTREMITY VENOGRAPHY - Central Venogram;  Surgeon: Waynetta Sandy, MD;  Location: [place] [facility];  Service: Cardiovascular;  Laterality: N/A;   LOWER EXTREMITY VENOGRAPHY Bilateral 03/09/2019   Procedure: LOWER EXTREMITY VENOGRAPHY;  Surgeon: Waynetta Sandy, MD;  Location: [place] [facility];  Service: Cardiovascular;  Laterality: Bilateral;   LOWER EXTREMITY VENOGRAPHY Left 08/16/2019   Procedure: LOWER EXTREMITY VENOGRAPHY;  Surgeon: Waynetta Sandy, MD;  Location: [place] [facility];  Service: Cardiovascular;  Laterality: Left;   LOWER EXTREMITY VENOGRAPHY Left 03/25/2022   Procedure: LOWER EXTREMITY VENOGRAPHY;  Surgeon: Waynetta Sandy, MD;  Location: [place] [facility];  Service: Cardiovascular;  Laterality: Left;  IVUS   LUMBAR FUSION  11/21/2000   L5-S1   LUMBAR SPINE SURGERY     x 2 others   PATCH ANGIOPLASTY Left 03/30/2019   Procedure: Patch Angioplasty of the Left Common Femoral Vein using Venosure Biologic patch;  Surgeon: Waynetta Sandy, MD;  Location: [place];  Service: Vascular;  Laterality: Left;   PERIPHERAL VASCULAR INTERVENTION Left 08/16/2019   Procedure: PERIPHERAL VASCULAR INTERVENTION;  Surgeon: Waynetta Sandy, MD;  Location: [place] [facility];  Service: Cardiovascular;  Laterality: Left;  common femoral/femoral vein stent   PERIPHERAL VASCULAR INTERVENTION Left 03/25/2022   Procedure: PERIPHERAL VASCULAR INTERVENTION;  Surgeon: Waynetta Sandy, MD;  Location: [place] [facility];  Service: Cardiovascular;  Laterality: Left;  COMMON FEMORAL VEIN   PERIPHERAL VASCULAR THROMBECTOMY Left 03/25/2022   Procedure: PERIPHERAL VASCULAR THROMBECTOMY;  Surgeon: Waynetta Sandy, MD;  Location: [place] [facility];  Service: Cardiovascular;  Laterality: Left;  EXTREMITY/IVC   TRIGGER FINGER RELEASE Right 12/01/2017   Procedure: RELEASE TRIGGER FINGER/A-1 PULLEY RIGHT THUMB;  Surgeon: Leanora Cover, MD;  Location: [place];  Service: Orthopedics;  Laterality: Right;   TRIGGER FINGER RELEASE Left 01/26/2018   Procedure: LEFT TRIGGER THUMB RELEASE;  Surgeon: Leanora Cover, MD;  Location: [place];  Service: Orthopedics;  Laterality: Left;   ULTRASOUND GUIDANCE FOR VASCULAR ACCESS Right 03/30/2019   Procedure: Ultrasound-guided cannulation right internal jugular vein;  Surgeon: Waynetta Sandy, MD;  Location: [place];  Service: Vascular;  Laterality: Right;    Short Social History:  Social History   Tobacco Use   Smoking status: Never   Smokeless tobacco: Never  Substance Use Topics   Alcohol use: No    Allergies  Allergen Reactions   Aripiprazole Other (See Comments)    BECOMES  VIOLENT    Seroquel [Quetiapine Fumarate] Other (See Comments)    BECOMES VIOLENT   Chlorpromazine Other (See Comments)    SEVERE ANXIETY    Gabapentin Other (See Comments)    NIGHTMARES     Current Outpatient Medications  Medication Sig Dispense Refill   ALPRAZolam (XANAX) 1 MG tablet Take 1 tablet (1 mg total) by mouth 2 (two) times daily as needed. for anxiety 60 tablet 2   amitriptyline (ELAVIL) 50 MG tablet Take 1 tablet (50 mg total) by mouth at bedtime. Dosage increase 30 tablet 2   aspirin EC 81 MG tablet Take 81 mg by mouth daily.     aspirin-acetaminophen-caffeine (EXCEDRIN MIGRAINE) 250-250-65 MG tablet Take 2 tablets by mouth every 6 (six) hours as needed for headache.      atorvastatin (LIPITOR) 40 MG tablet Take 1 tablet (40 mg total) by mouth daily. 90 tablet 1   cetirizine (ZYRTEC) 10 MG tablet TAKE 1 TABLET BY MOUTH DAILY 90 tablet 1   colchicine 0.6 MG tablet Take 1.'2mg'$  (2 tablets) then 0.'6mg'$  (1 tablet) 1 hour after. Then, take 1 tablet every day for 7 days. 10 tablet 2   FLUoxetine (PROZAC) 20 MG capsule Take 1 capsule (20 mg total) by mouth daily. Take with 40 mg to equal 60 mg daily 90 capsule 2   FLUoxetine (PROZAC) 40 MG capsule Take 1 capsule (40 mg total) by mouth daily. Take with 20 mg to equal 60 mg daily 90 capsule 2   GARLIC PO Take 1 tablet by mouth daily.     hydrochlorothiazide (HYDRODIURIL) 25 MG tablet TAKE 1 TABLET BY MOUTH DAILY Please schedule office visit BEFORE any future refills 90 tablet 1   HYDROcodone-acetaminophen (NORCO) 10-325 MG tablet Take 1 tablet by mouth every 6 (six) hours as needed. To last 30 days from fill date 120 tablet 0   levothyroxine (SYNTHROID) 125 MCG tablet Take 125 mcg by mouth daily before breakfast.     LINZESS 145 MCG CAPS capsule TAKE ONE CAPSULE BY MOUTH DAILY 90 capsule 3   lisinopril (ZESTRIL) 5 MG tablet Take 1 tablet (5 mg total) by mouth daily. 90 tablet 1   methocarbamol (ROBAXIN) 750 MG tablet Take 1 tablet (750 mg total) by mouth 2 (two) times daily as needed for muscle spasms. 60 tablet 5   Multiple Vitamin (MULTIVITAMIN) capsule Take 1 capsule by mouth daily.     Omega-3 Fatty Acids (OMEGA-3 FISH OIL PO) Take 1 capsule by mouth daily.     pantoprazole (PROTONIX) 40 MG tablet TAKE 1 TABLET BY MOUTH DAILY 90 tablet 3   potassium chloride (KLOR-CON M) 10 MEQ tablet TAKE 1 TABLET BY MOUTH THREE TIMES DAILY 90 tablet 1   pregabalin (LYRICA) 100 MG capsule TAKE ONE CAPSULE BY MOUTH FOUR TIMES DAILY 120 capsule 1   rOPINIRole (REQUIP) 2 MG tablet TAKE 1 TABLET BY MOUTH TWICE DAILY 180 tablet 3   Semaglutide, 1 MG/DOSE, 4 MG/3ML SOPN Inject 1 mg as directed once a week. 9 mL 0   Vibegron (GEMTESA) 75 MG TABS  Take 75 mg by mouth daily. 30 tablet 11   warfarin (COUMADIN) 5 MG tablet TAKE  1 AND 1/2 TABLETS BY MOUTH ON TUEDAYS AND THURSDAYS, THEN TAKE 1 TABLET DAILY ON ALL OTHER DAYS (Patient taking differently: Take 2.5-5 mg by mouth See admin instructions. Take 5 mg daily except on Wednesdays take 2.5 mg dose) 60 tablet 3   No current facility-administered medications for this visit.    Review of Systems  Constitutional:  Constitutional negative. HENT: HENT negative.  Eyes: Eyes negative.  Respiratory: Respiratory negative.  Cardiovascular: Positive for leg swelling.  GI: Gastrointestinal negative.  Musculoskeletal: Positive for back pain and leg pain.  Skin: Skin negative.  Neurological: Neurological negative. Hematologic: Hematologic/lymphatic negative.  Psychiatric: Psychiatric negative.        Objective:  Objective   Vitals:   06/19/22 0845  BP: 122/70  Pulse: 67  Resp: 20  Temp: 97.9 F (36.6 C)  SpO2: 97%  Weight: 251 lb (113.9 kg)  Height: '5\' 10"'$  (1.778 m)   Body mass index is 36.01 kg/m.  Physical Exam HENT:     Head: Normocephalic.     Nose: Nose normal.  Eyes:     Pupils: Pupils are equal, round, and reactive to light.  Cardiovascular:     Rate and Rhythm: Normal rate.     Pulses: Normal pulses.  Abdominal:     General: Abdomen is flat.     Palpations: Abdomen is soft.  Musculoskeletal:     Cervical back: Normal range of motion and neck supple.     Right lower leg: No edema.     Left lower leg: Edema present.     Comments: Right leg measures 38 cm, left leg 42 cm (measured 10 cm from tibial tuberosity)  Skin:    Capillary Refill: Capillary refill takes less than 2 seconds.  Neurological:     General: No focal deficit present.     Mental Status: She is alert.  Psychiatric:        Mood and Affect: Mood normal.        Thought Content: Thought content normal.     Data: CT Venogram IMPRESSION: VASCULAR   1. Thrombosed proximal LEFT lower extremity  stent, involving its entirety and extending from the LEFT SFV to the IVC bifurcation. 2. LEFT lower extremity venous drainage by cross pelvic and additional venous collaterals.     Assessment/Plan:    56 year old female with extensive previous venous history now on Coumadin therapy and working out in the swimming pool this summer.  CT venogram we reviewed today demonstrates occlusion of her stents which occurred 6 weeks after her last procedure.  I discussed with her that we could return for repeat thrombectomy but I cannot guarantee her any long-term patency results and given that her leg at this time is really only mildly edematous and controlled with compression stockings and exercise and Coumadin I think that she may be able to develop venous collaterals and further procedures may only make her worse in the long run.  She demonstrates good understanding of this and we will plan for continued watchful waiting with a left lower extremity duplex to evaluate for patency of her femoral veins in 6 months.  Certainly if her symptoms worsen we will need to entertain repeat thrombectomy.     Waynetta Sandy MD [facility]

## 2022-06-20 ENCOUNTER — Ambulatory Visit
Admission: RE | Admit: 2022-06-20 | Discharge: 2022-06-20 | Disposition: A | Discharge status: Home / Self Care | Payer: Medicaid Other | Source: Ambulatory Visit | Attending: Physician Assistant | Admitting: Physician Assistant

## 2022-06-20 ENCOUNTER — Ambulatory Visit (HOSPITAL_BASED_OUTPATIENT_CLINIC_OR_DEPARTMENT_OTHER): Payer: Medicaid Other | Admitting: Student in an Organized Health Care Education/Training Program

## 2022-06-20 ENCOUNTER — Ambulatory Visit: Payer: Medicaid Other | Admitting: Physician Assistant

## 2022-06-20 ENCOUNTER — Encounter: Payer: Self-pay | Admitting: Student in an Organized Health Care Education/Training Program

## 2022-06-20 ENCOUNTER — Ambulatory Visit
Admission: RE | Admit: 2022-06-20 | Discharge: 2022-06-20 | Disposition: A | Discharge status: Home / Self Care | Payer: Medicaid Other | Attending: Physician Assistant | Admitting: Physician Assistant

## 2022-06-20 ENCOUNTER — Encounter: Payer: Self-pay | Admitting: Physician Assistant

## 2022-06-20 VITALS — BP 131/81 | HR 73 | Temp 98.0°F | Resp 16 | Wt 250.0 lb

## 2022-06-20 VITALS — BP 143/77 | HR 72 | Temp 97.0°F | Ht 70.0 in | Wt 250.0 lb

## 2022-06-20 DIAGNOSIS — R918 Other nonspecific abnormal finding of lung field: Secondary | ICD-10-CM | POA: Insufficient documentation

## 2022-06-20 DIAGNOSIS — G8929 Other chronic pain: Secondary | ICD-10-CM | POA: Insufficient documentation

## 2022-06-20 DIAGNOSIS — R0602 Shortness of breath: Secondary | ICD-10-CM

## 2022-06-20 DIAGNOSIS — H9202 Otalgia, left ear: Secondary | ICD-10-CM

## 2022-06-20 DIAGNOSIS — G894 Chronic pain syndrome: Secondary | ICD-10-CM

## 2022-06-20 DIAGNOSIS — M792 Neuralgia and neuritis, unspecified: Secondary | ICD-10-CM

## 2022-06-20 DIAGNOSIS — M7918 Myalgia, other site: Secondary | ICD-10-CM | POA: Insufficient documentation

## 2022-06-20 DIAGNOSIS — I871 Compression of vein: Secondary | ICD-10-CM | POA: Insufficient documentation

## 2022-06-20 DIAGNOSIS — M5030 Other cervical disc degeneration, unspecified cervical region: Secondary | ICD-10-CM | POA: Insufficient documentation

## 2022-06-20 DIAGNOSIS — M5136 Other intervertebral disc degeneration, lumbar region: Secondary | ICD-10-CM

## 2022-06-20 DIAGNOSIS — M1712 Unilateral primary osteoarthritis, left knee: Secondary | ICD-10-CM | POA: Insufficient documentation

## 2022-06-20 DIAGNOSIS — E1142 Type 2 diabetes mellitus with diabetic polyneuropathy: Secondary | ICD-10-CM

## 2022-06-20 DIAGNOSIS — Z0289 Encounter for other administrative examinations: Secondary | ICD-10-CM

## 2022-06-20 DIAGNOSIS — J9811 Atelectasis: Secondary | ICD-10-CM | POA: Diagnosis not present

## 2022-06-20 DIAGNOSIS — M961 Postlaminectomy syndrome, not elsewhere classified: Secondary | ICD-10-CM | POA: Diagnosis not present

## 2022-06-20 MED ORDER — HYDROCODONE-ACETAMINOPHEN 10-325 MG PO TABS: 1.0000 | ORAL_TABLET | Freq: Four times a day (QID) | ORAL | 0 refills | Status: DC | PRN

## 2022-06-20 MED ORDER — HYDROCODONE-ACETAMINOPHEN 10-325 MG PO TABS: 1.0000 | ORAL_TABLET | Freq: Four times a day (QID) | ORAL | 0 refills | Status: AC | PRN

## 2022-06-20 MED ORDER — ACETIC ACID 2 % OT SOLN: 4.0000 [drp] | OTIC | 0 refills | Status: DC

## 2022-06-20 MED ORDER — HYDROCORTISONE-ACETIC ACID 1-2 % OT SOLN: 3.0000 [drp] | Freq: Two times a day (BID) | OTIC | 0 refills | Status: DC

## 2022-06-20 MED ORDER — CIPROFLOXACIN-DEXAMETHASONE 0.3-0.1 % OT SUSP: 4.0000 [drp] | Freq: Two times a day (BID) | OTIC | 0 refills | Status: DC

## 2022-06-20 NOTE — Progress Notes (Signed)
Nursing Pain Medication Assessment:  Safety precautions to be maintained throughout the outpatient stay will include: orient to surroundings, keep bed in low position, maintain call bell within reach at all times, provide assistance with transfer out of bed and ambulation.  Medication Inspection Compliance: Pill count conducted under aseptic conditions, in front of the patient. Neither the pills nor the bottle was removed from the patient's sight at any time. Once count was completed pills were immediately returned to the patient in their original bottle.  Medication: Hydrocodone/APAP Pill/Patch Count:  35.5 of 120 pills remain Pill/Patch Appearance: Markings consistent with prescribed medication Bottle Appearance: Standard pharmacy container. Clearly labeled. Filled Date: 07 / 27 / 2023 Last Medication intake:  Today

## 2022-06-20 NOTE — Progress Notes (Unsigned)
I,Angely Dietz Robinson,acting as a Education administrator for [employer], PA-C.,have documented all relevant documentation on the behalf of Mardene Speak, PA-C,as directed by  [employer], PA-C while in the presence of [employer], PA-C.   Established patient visit   Patient: Jane [name]   DOB: January 16, 1966   56 y.o. Female  MRN: [id number] Visit Date: 06/20/2022  Today's healthcare provider: Mardene Speak, PA-C   Chief Complaint  Patient presents with   Dizziness   Subjective    Patient is a 56 yr old female presenting for mild lightheadedness since yesterday, left ear irritation, throat irritation, metallic taste in mouth and dry mouth.  Taking nothing for symptoms.  Reports she does not have environmental allergies.   Medications: Outpatient Medications Prior to Visit  Medication Sig   ALPRAZolam (XANAX) 1 MG tablet Take 1 tablet (1 mg total) by mouth 2 (two) times daily as needed. for anxiety   amitriptyline (ELAVIL) 50 MG tablet TAKE 1 TABLET BY MOUTH AT BEDTIME (dosage increase)   aspirin EC 81 MG tablet Take 81 mg by mouth daily.   aspirin-acetaminophen-caffeine (EXCEDRIN MIGRAINE) 250-250-65 MG tablet Take 2 tablets by mouth every 6 (six) hours as needed for headache.   atorvastatin (LIPITOR) 40 MG tablet Take 1 tablet (40 mg total) by mouth daily.   cetirizine (ZYRTEC) 10 MG tablet TAKE 1 TABLET BY MOUTH DAILY   colchicine 0.6 MG tablet Take 1.'2mg'$  (2 tablets) then 0.'6mg'$  (1 tablet) 1 hour after. Then, take 1 tablet every day for 7 days.   FLUoxetine (PROZAC) 20 MG capsule Take 1 capsule (20 mg total) by mouth daily. Take with 40 mg to equal 60 mg daily   FLUoxetine (PROZAC) 40 MG capsule Take 1 capsule (40 mg total) by mouth daily. Take with 20 mg to equal 60 mg daily   GARLIC PO Take 1 tablet by mouth daily.   hydrochlorothiazide (HYDRODIURIL) 25 MG tablet TAKE 1 TABLET BY MOUTH DAILY Please schedule office visit BEFORE any future refills   [START ON 06/29/2022]  HYDROcodone-acetaminophen (NORCO) 10-325 MG tablet Take 1 tablet by mouth every 6 (six) hours as needed. To last 30 days from fill date   [START ON 07/29/2022] HYDROcodone-acetaminophen (NORCO) 10-325 MG tablet Take 1 tablet by mouth every 6 (six) hours as needed. To last 30 days from fill date   [START ON 08/28/2022] HYDROcodone-acetaminophen (NORCO) 10-325 MG tablet Take 1 tablet by mouth every 6 (six) hours as needed. To last 30 days from fill date   levothyroxine (SYNTHROID) 125 MCG tablet Take 125 mcg by mouth daily before breakfast.   LINZESS 145 MCG CAPS capsule TAKE ONE CAPSULE BY MOUTH DAILY   lisinopril (ZESTRIL) 5 MG tablet Take 1 tablet (5 mg total) by mouth daily.   methocarbamol (ROBAXIN) 750 MG tablet Take 1 tablet (750 mg total) by mouth 2 (two) times daily as needed for muscle spasms.   Multiple Vitamin (MULTIVITAMIN) capsule Take 1 capsule by mouth daily.   Omega-3 Fatty Acids (OMEGA-3 FISH OIL PO) Take 1 capsule by mouth daily.   pantoprazole (PROTONIX) 40 MG tablet TAKE 1 TABLET BY MOUTH DAILY   potassium chloride (KLOR-CON M) 10 MEQ tablet TAKE 1 TABLET BY MOUTH THREE TIMES DAILY   pregabalin (LYRICA) 100 MG capsule TAKE ONE CAPSULE BY MOUTH FOUR TIMES DAILY   rOPINIRole (REQUIP) 2 MG tablet TAKE 1 TABLET BY MOUTH TWICE DAILY   Semaglutide, 1 MG/DOSE, 4 MG/3ML SOPN Inject 1 mg as directed once a week.  Vibegron (GEMTESA) 75 MG TABS Take 75 mg by mouth daily.   warfarin (COUMADIN) 5 MG tablet TAKE 1 AND 1/2 TABLETS BY MOUTH ON TUEDAYS AND THURSDAYS, THEN TAKE 1 TABLET DAILY ON ALL OTHER DAYS (Patient taking differently: Take 5 mg by mouth daily.)   No facility-administered medications prior to visit.    Review of Systems  All other systems reviewed and are negative. See HPI  {Labs  Heme  Chem  Endocrine  Serology  Results Review (optional):23779}   Objective    BP 131/81 (BP Location: Left Arm, Patient Position: Sitting, Cuff Size: Normal)   Pulse 73   Temp 98 F  (36.7 C) (Oral)   Resp 16   Wt 250 lb (113.4 kg)   LMP  (LMP Unknown)   SpO2 98%   BMI 35.87 kg/m  {Show previous vital signs (optional):23777}  Physical Exam Vitals reviewed.  Constitutional:      General: She is not in acute distress.    Appearance: Normal appearance. She is well-developed. She is not diaphoretic.  HENT:     Head: Normocephalic and atraumatic.  Eyes:     General: No scleral icterus.    Conjunctiva/sclera: Conjunctivae normal.  Neck:     Thyroid: No thyromegaly.  Cardiovascular:     Rate and Rhythm: Normal rate and regular rhythm.     Pulses: Normal pulses.     Heart sounds: Normal heart sounds. No murmur heard. Pulmonary:     Effort: Pulmonary effort is normal. No respiratory distress.     Breath sounds: Normal breath sounds. No wheezing, rhonchi or rales.  Musculoskeletal:     Cervical back: Neck supple.     Right lower leg: No edema.     Left lower leg: No edema.  Lymphadenopathy:     Cervical: No cervical adenopathy.  Skin:    General: Skin is warm and dry.     Findings: No rash.  Neurological:     Mental Status: She is alert and oriented to person, place, and time. Mental status is at baseline.  Psychiatric:        Mood and Affect: Mood normal.        Behavior: Behavior normal.       No results found for any visits on 06/20/22.  Assessment & Plan     1. Ear pain, left  - acetic acid 2 % otic solution; Place 4 drops into both ears every 3 (three) hours.  Dispense: 15 mL; Refill: 0 - acetic acid-hydrocortisone (VOSOL-HC) OTIC solution; Place 3 drops into both ears 2 (two) times daily.  Dispense: 10 mL; Refill: 0 - ciprofloxacin-dexamethasone (CIPRODEX) OTIC suspension; Place 4 drops into both ears 2 (two) times daily.  Dispense: 7.5 mL; Refill: 0  2. SOB (shortness of breath)  - Pro b natriuretic peptide (BNP)[facility])  3. Lung field abnormal finding on examination  - DG Chest 2 View; Future - Pro b  natriuretic peptide (BNP)[facility])  Fu with Dr. Ky Barban next week     The patient was advised to call back or seek an in-person evaluation if the symptoms worsen or if the condition fails to improve as anticipated.  I discussed the assessment and treatment plan with the patient. The patient was provided an opportunity to ask questions and all were answered. The patient agreed with the plan and demonstrated an understanding of the instructions.  The entirety of the information documented in the History of Present Illness, Review of  Systems and Physical Exam were personally obtained by me. Portions of this information were initially documented by the CMA and reviewed by me for thoroughness and accuracy.  Portions of this note were created using dictation software and may contain typographical errors.       Total encounter time more than 30 minutes  Greater than 50% was spent in counseling and coordination of care with the patient    Elberta Leatherwood  [facility] [phone] (phone) [phone] (fax)  [place]

## 2022-06-20 NOTE — Progress Notes (Signed)
PROVIDER NOTE: Information contained herein reflects review and annotations entered in association with encounter. Interpretation of such information and data should be left to medically-trained personnel. Information provided to patient can be located elsewhere in the medical record under "Patient Instructions". Document created using STT-dictation technology, any transcriptional errors that may result from process are unintentional.    Patient: Jane [name]  Service Category: E/M  Provider: Gillis Santa, MD  DOB: 12-18-1965  DOS: 06/20/2022  Specialty: Interventional Pain Management  MRN: [id number]  Setting: Ambulatory outpatient  PCP: [name], MD  Type: Established Patient    Referring Provider: [name], MD  Location: Office  Delivery: Face-to-face     HPI  [name], a 56 y.o. year old female, is here today because of her Failed back surgical syndrome [M96.1]. Ms. Start primary complain today is Back Pain and Knee Pain (left)  Last encounter: My last encounter with her was on 03/26/22  Pertinent problems: Ms. 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 Pain Assessment: Severity of Chronic pain is reported  as a 7 /10. Location: Back Lower/radiates to left leg to left knee. Onset: More than a month ago. Quality: Aching. Timing: Constant. Modifying factor(s): nothing. Vitals:  height is $RemoveB'5\' 10"'RqQelPlo$  (1.778 m) and weight is 250 lb (113.4 kg). Her temperature is 97 F (36.1 C) (abnormal). Her blood pressure is 143/77 (abnormal) and her pulse is 72. Her oxygen saturation is 98%.   Reason for encounter: medication management.    Kohen presents today for medication management.  She is status post reek cannulization of left common external iliac artery stents and common femoral vein stent with restenting.  She is on Coumadin after failing direct oral anticoagulant therapy.  She has had worsening swelling of both of her legs.  She continues to have low back pain.  Of note, CT venogram shows occlusion of her stents.  Dr. Gwenlyn Saran has discussed repeat thrombectomy with her but she is high risk.  She is currently being managed on Coumadin and surgery is being postponed.  I will refill her hydrocodone as below, no change in dose.  We will repeat urine toxicology screen.    Pharmacotherapy Assessment    Analgesic: Hydrocodone 10 mg 4 times daily as needed, quantity 120/month; MME equals 40   Monitoring: [place] PMP: PDMP not reviewed this encounter.       Pharmacotherapy: No side-effects or adverse reactions reported. Compliance: No problems identified. Effectiveness: Clinically acceptable.  UDS:  Summary  Date Value Ref Range Status  03/26/2022 Note  Final    Comment:    ==================================================================== [employer]ox[employer] Select 13 (MW) ==================================================================== Test                             Result       Flag       Units  Drug Present and Declared for Prescription Verification  Hydrocodone                    2501         EXPECTED   ng/mg creat   Dihydrocodeine                 286          EXPECTED   ng/mg creat   Norhydrocodone                  2354         EXPECTED   ng/mg creat    Sources of hydrocodone include scheduled prescription medications.    Dihydrocodeine and norhydrocodone are expected metabolites of    hydrocodone. Dihydrocodeine is also available as a scheduled    prescription medication.  Drug Present not Declared for Prescription Verification   Alpha-hydroxymidazolam         392          UNEXPECTED ng/mg creat    Alpha-hydroxymidazolam is an expected metabolite of midazolam.    Source of midazolam is a scheduled prescription medication.    Oxycodone                      361          UNEXPECTED ng/mg creat    Sources of oxycodone include scheduled prescription medications.    Fentanyl                       1            UNEXPECTED ng/mg creat   Norfentanyl                    5            UNEXPECTED ng/mg creat    Source of fentanyl is a scheduled prescription medication, including    IV, patch, and transmucosal formulations. Norfentanyl is an expected    metabolite of fentanyl.  Drug Absent but Declared for Prescription Verification   Alprazolam                     Not Detected UNEXPECTED ng/mg creat ==================================================================== Test                      Result    Flag   Units      Ref Range   Creatinine              100              mg/dL      >=20 ==================================================================== Declared Medications:  The flagging and interpretation on this report are based on the  following declared medications.  Unexpected results may arise from  inaccuracies in the declared medications.   **Note: The testing scope of this panel includes these medications:   Alprazolam (Xanax)  Hydrocodone (Norco)   **Note: The testing scope of this panel does not include the  following reported medications:   Acetaminophen (Excedrin)  Acetaminophen (Norco)  Amitriptyline (Elavil)  Aspirin  Aspirin (Excedrin)  Atorvastatin (Lipitor)  Caffeine  (Excedrin)  Cetirizine (Zyrtec)  Colchicine  Fluoxetine (Prozac)  Hydrochlorothiazide (Hydrodiuril)  Levothyroxine (Synthroid)  Linaclotide (Linzess)  Lisinopril (Zestril)  Methocarbamol (Robaxin)  Multivitamin  Omega-3 Fatty Acids  Pantoprazole (Protonix)  Potassium (Klor-Con)  Pregabalin (Lyrica)  Ropinirole (Requip)  Semaglutide  Supplement  Vibegron ([place]a)  Warfarin (Coumadin) ==================================================================== For  clinical consultation, please call [phone]. ====================================================================       ROS  Constitutional: Denies any fever or chills Gastrointestinal: No reported hemesis, hematochezia, vomiting, or acute GI distress Musculoskeletal: bilateral leg pain, bilateral thigh calf pain, right greater than left, bilateral foot pain Neurological: No reported episodes of acute onset apraxia, aphasia, dysarthria, agnosia, amnesia, paralysis, loss of coordination, or loss of consciousness  Medication Review  ALPRAZolam, FLUoxetine, Garlic, HYDROcodone-acetaminophen, Omega-3 Fatty Acids, Semaglutide (1 MG/DOSE), Vibegron, amitriptyline, aspirin EC, aspirin-acetaminophen-caffeine, atorvastatin, cetirizine, colchicine, hydrochlorothiazide, levothyroxine, linaclotide, lisinopril, methocarbamol, multivitamin, pantoprazole, potassium chloride, pregabalin, rOPINIRole, and warfarin  History Review  Allergy: Ms. Egloff is allergic to aripiprazole, seroquel [quetiapine fumarate], chlorpromazine, and gabapentin. Drug: Ms. Frater  reports no history of drug use. Alcohol:  reports no history of alcohol use. Tobacco:  reports that she has never smoked. She has never used smokeless tobacco. Social: Ms. Paterson  reports that she has never smoked. She has never used smokeless tobacco. She reports that she does not drink alcohol and does not use drugs. Medical:  has a past medical history of ADD (attention  deficit disorder), Anxiety, Back pain, Bilateral swelling of feet, Bipolar 1 disorder ([place]), Bipolar 1 disorder ([place]), Bipolar disorder ([place]), Chewing difficulty, Chronic fatigue syndrome, Chronic kidney disease, Constipation, Depression, Diabetes mellitus without complication ([place]), Dyspnea, GERD (gastroesophageal reflux disease), Headache, High cholesterol, History of blood clots, History of DVT (deep vein thrombosis), Hypertension, Hypothyroidism, Joint pain, Neuropathy, Obsessive-compulsive disorder, Peripheral vascular disease ([place]), Prediabetes, Respiratory failure requiring intubation ([place]), Restless leg syndrome, Shortness of breath, Sleep apnea, Thrombocytopenia ([place]) (09/15/2019), Trigger thumb of left hand (01/2018), and Trigger thumb of right hand. Surgical: Ms. Polo  has a past surgical history that includes Cholecystectomy; Trigger finger release (Right, 12/01/2017); Abdominal hysterectomy (06/2016); Lumbar fusion (11/21/2000); Lumbar spine surgery; Trigger finger release (Left, 01/26/2018); LOWER EXTREMITY VENOGRAPHY (N/A, 08/17/2018); Femoral-femoral Bypass Graft (Left, 11/24/2018); AV fistula placement (Left, 11/24/2018); LOWER EXTREMITY VENOGRAPHY (Bilateral, 03/09/2019); Patch angioplasty (Left, 03/30/2019); Ultrasound guidance for vascular access (Right, 03/30/2019); Femoral artery debridement (03/30/2019); Insertion of iliac stent (03/30/2019); Groin debridement (Left, 04/27/2019); Application if wound vac (Left, 04/27/2019); LOWER EXTREMITY VENOGRAPHY (Left, 08/16/2019); PERIPHERAL VASCULAR INTERVENTION (Left, 08/16/2019); Back surgery; Colonoscopy with propofol (N/A, 11/22/2019); Knee arthroscopy with meniscal repair (Left, 11/14/2020); LOWER EXTREMITY VENOGRAPHY (Left, 03/25/2022); PERIPHERAL VASCULAR THROMBECTOMY (Left, 03/25/2022); and PERIPHERAL VASCULAR INTERVENTION (Left, 03/25/2022). Family: family history includes ADD / ADHD in her daughter; Anxiety disorder in her daughter; Bipolar disorder in  her daughter, maternal aunt, and mother; Depression in her mother; Diabetes in her father; Drug abuse in her daughter and daughter; Heart disease in her brother, father, and mother; Hyperlipidemia in her brother, father, and mother; Hypertension in her brother, father, mother, and sister; Obesity in her father and mother; Sleep apnea in her father and mother; Stroke in her mother; Suicidality in her maternal aunt.  Laboratory Chemistry Profile   Renal Lab Results  Component Value Date   BUN 23 04/22/2022   CREATININE 1.40 (H) 04/22/2022   BCR 16 04/22/2022   GFRAA 53 (L) 07/18/2020   GFRNONAA 52 (L) 04/12/2021     Hepatic Lab Results  Component Value Date   AST 16 04/22/2022   ALT 21 04/22/2022   ALBUMIN 4.6 04/22/2022   ALKPHOS 107 04/22/2022   HCVAB NON REACTIVE 11/06/2021     Electrolytes Lab Results  Component Value Date   NA 143 04/22/2022   K 4.3 04/22/2022   CL 104 04/22/2022   CALCIUM 9.7 04/22/2022  MG 2.2 04/04/2019   PHOS 4.0 04/04/2019     Bone Lab Results  Component Value Date   VD25OH 47.10 02/21/2022     Inflammation (CRP: Acute Phase) (ESR: Chronic Phase) Lab Results  Component Value Date   CRP 11 (H) 02/16/2020   ESRSEDRATE 33 02/16/2020   LATICACIDVEN 1.2 01/05/2020       Note: Above Lab results reviewed.  Recent Imaging Review  CT VENOGRAM ABD/PEL CLINICAL DATA:  Claudication or leg ischemia. History of May-Thurner syndrome  EXAM: CT VENOGRAM ABDOMEN AND PELVIS  TECHNIQUE: Multidetector CT imaging of the abdomen and pelvis was performed using the standard protocol during bolus administration of intravenous contrast. Multiplanar reconstructed images and MIPs were obtained and reviewed to evaluate the vascular anatomy.  RADIATION DOSE REDUCTION: This exam was performed according to the departmental dose-optimization program which includes automated exposure control, adjustment of the mA and/or kV according to patient size and/or use  of iterative reconstruction technique.  CONTRAST:  69mL OMNIPAQUE IOHEXOL 350 MG/ML SOLN  COMPARISON:  CT AP, 09/26/2021 and 04/12/2021.  FINDINGS: VASCULAR  Veins:  *Venous stent extending from the LEFT SFV to the IVC bifurcation. *No evidence of intraluminal contrast opacification consistent with thrombosis. *Cross pelvic venous collaterals at the infra and suprapubic superficial pelvis. *Additional venous collateral involving the inferior and superior epigastric vein, akin to a Wins[place] pathway. *Patent RIGHT proximal lower extremity veins and IVC.  Review of the MIP images confirms the above findings.  NON-VASCULAR  Lower chest: No acute abnormality.  Hepatobiliary: No focal liver abnormality is seen. Status post cholecystectomy. No biliary dilatation.  Pancreas: No pancreatic ductal dilatation or surrounding inflammatory changes.  Spleen: Normal in size without focal abnormality.  Adrenals/Urinary Tract: Adrenal glands are unremarkable. Kidneys are normal, without renal calculi, focal lesion, or hydronephrosis. Bladder is unremarkable.  Stomach/Bowel: Stomach is within normal limits. Appendix is surgically absent. No evidence of bowel wall thickening, distention, or inflammatory changes.  Lymphatic: Mild burden of aortic atherosclerosis. No enlarged abdominal or pelvic lymph nodes.  Reproductive: Status post hysterectomy. No adnexal masses.  Other: No abdominal wall hernia. No abdominopelvic ascites.  RIGHT groin cutdown scars. Anterior abdominal wall contusions, likely injection sites. Superficial, likely injection, granulomata at the RIGHT hip soft tissue.  Musculoskeletal: Degenerative changes spine, greatest at L5-S1 and including posterior decompression and disc spacers. No acute osseous findings.  IMPRESSION: VASCULAR  1. Thrombosed proximal LEFT lower extremity stent, involving its entirety and extending from the LEFT SFV to the IVC  bifurcation. 2. LEFT lower extremity venous drainage by cross pelvic and additional venous collaterals.  NON-VASCULAR  No acute abdominopelvic process.  Additional incidental, chronic and senescent findings as above.  These results will be called to the ordering clinician or representative by the Radiologist Assistant, and communication documented in the PACS or [employer].  Michaelle Birks, MD  [facility]  [facility]  Electronically Signed   By: Michaelle Birks M.D.   On: 06/10/2022 10:29  Note: Reviewed        Physical Exam  General appearance: Well nourished, well developed, and well hydrated. In no apparent acute distress Mental status: Alert, oriented x 3 (person, place, & time)       Respiratory: No evidence of acute respiratory distress Eyes: PERLA Vitals: BP (!) 143/77   Pulse 72   Temp (!) 97 F (36.1 C)   Ht $R'5\' 10"'Lz$  (1.778 m)   Wt 250 lb (113.4 kg)   LMP  (LMP  Unknown)   SpO2 98%   BMI 35.87 kg/m  BMI: Estimated body mass index is 35.87 kg/m as calculated from the following:   Height as of this encounter: $RemoveBeforeD'5\' 10"'ZqJxZfpWtmpFSA$  (1.778 m).   Weight as of this encounter: 250 lb (113.4 kg). Ideal: Ideal body weight: 68.5 kg (151 lb 0.2 oz) Adjusted ideal body weight: 86.5 kg (190 lb 9.7 oz)  Lumbar Spine Area Exam  Skin & Axial Inspection: Well healed scar from previous spine surgery detected Alignment: Asymmetric Functional ROM: Decreased ROM       Stability: No instability detected Muscle Tone/Strength: Functionally intact. No obvious neuro-muscular anomalies detected. Sensory (Neurological): Dermatomal pain pattern   Provocative Tests: Hyperextension/rotation test: (+) bilaterally for facet joint pain. Lumbar quadrant test (Kemp's test): (+) bilaterally for facet joint pain.     Gait & Posture Assessment  Ambulation: Limited Gait: Antalgic gait (limping) Posture: Difficulty standing up straight, due to pain   Lower Extremity Exam      Side: Right lower extremity   Side: Left lower extremity  Stability: No instability observed           Stability: No instability observed          Skin & Extremity Inspection: Skin color, temperature, and hair growth are WNL. No peripheral edema or cyanosis. No masses, redness, swelling, asymmetry, or associated skin lesions. No contractures.   Skin & Extremity Inspection: Skin color, temperature, and hair growth are WNL. No peripheral edema or cyanosis. No masses, redness, swelling, asymmetry, or associated skin lesions. No contractures.  Functional ROM: Decreased ROM for hip and knee joints           Functional ROM: Decreased ROM for hip and knee joints, pain with weight bearing and lateral rotation          Muscle Tone/Strength: Functionally intact. No obvious neuro-muscular anomalies detected.   Muscle Tone/Strength: Functionally intact. No obvious neuro-muscular anomalies detected.  Sensory (Neurological): Neurogenic pain pattern        Sensory (Neurological):  Arthropathic arthralgia for left knee severe pain with weightbearing        DTR: Patellar: deferred today Achilles: deferred today Plantar: deferred today   DTR: Patellar: deferred today Achilles: deferred today Plantar: deferred today  Palpation: No palpable anomalies   Palpation: No palpable anomalies    Assessment   Status Diagnosis  Persistent Persistent Persistent 1. Failed back surgical syndrome   2. Neurogenic pain   3. Arthritis of left knee   4. Pain management contract signed   5. DDD (degenerative disc disease), lumbar   6. May-Thurner syndrome   7. DDD (degenerative disc disease), cervical   8. Diabetic peripheral neuropathy ([place])   9. Chronic pain syndrome   10. Chronic musculoskeletal pain         Plan of Care  Ms. Miriah Maruyama has a current medication list which includes the following long-term medication(s): amitriptyline, atorvastatin, cetirizine, colchicine,  fluoxetine, fluoxetine, hydrochlorothiazide, levothyroxine, linzess, lisinopril, pantoprazole, potassium chloride, pregabalin, ropinirole, and warfarin.  Pharmacotherapy (Medications Ordered): Meds ordered this encounter  Medications   HYDROcodone-acetaminophen (NORCO) 10-325 MG tablet    Sig: Take 1 tablet by mouth every 6 (six) hours as needed. To last 30 days from fill date    Dispense:  120 tablet    Refill:  0   HYDROcodone-acetaminophen (NORCO) 10-325 MG tablet    Sig: Take 1 tablet by mouth every 6 (six) hours as needed. To last 30 days from fill date  Dispense:  120 tablet    Refill:  0   HYDROcodone-acetaminophen (NORCO) 10-325 MG tablet    Sig: Take 1 tablet by mouth every 6 (six) hours as needed. To last 30 days from fill date    Dispense:  120 tablet    Refill:  0   Qutenza, capsaicin 8% topical treatment for painful diabetic neuropathy of bilateral feet denied by Medicaid (will reattempt submission in 3 to 4 months) Continue with psychiatric care Continue with multimodal analgesics including Lyrica, Robaxin as needed.  Refill of Robaxin as above. Follow-up for spinal injections or spinal cord stimulator trial if recommended by neurosurgery.  We will need to get clearance to stop anticoagulant prior to any neuraxial procedures as the patient is high risk to be off.   Orders Placed This Encounter  Procedures   ToxASSURE Select 13 (MW), Urine    Volume: 30 ml(s). Minimum 3 ml of urine is needed. Document temperature of fresh sample. Indications: Long term (current) use of opiate analgesic [phone])    Order Specific Question:   Release to patient    Answer:   Immediate      Follow-up plan:   Return in about 3 months (around 09/20/2022) for Medication Management, in person.   Recent Visits Date Type Provider Dept  03/26/22 Office Visit Gillis Santa, MD [facility]  Showing recent visits within past 90 days and meeting all other requirements Today's  Visits Date Type Provider Dept  06/20/22 Office Visit Gillis Santa, MD [facility]  Showing today's visits and meeting all other requirements Future Appointments No visits were found meeting these conditions. Showing future appointments within next 90 days and meeting all other requirements  I discussed the assessment and treatment plan with the patient. The patient was provided an opportunity to ask questions and all were answered. The patient agreed with the plan and demonstrated an understanding of the instructions.  Patient advised to call back or seek an in-person evaluation if the symptoms or condition worsens.  Duration of encounter: 29minutes.  Note by: Gillis Santa, MD Date: 06/20/2022; Time: 11:18 AM

## 2022-06-21 ENCOUNTER — Other Ambulatory Visit: Payer: Self-pay

## 2022-06-21 DIAGNOSIS — I87002 Postthrombotic syndrome without complications of left lower extremity: Secondary | ICD-10-CM

## 2022-06-21 LAB — PRO B NATRIURETIC PEPTIDE: NT-Pro BNP: 68 pg/mL (ref 0–287)

## 2022-06-22 ENCOUNTER — Other Ambulatory Visit: Payer: Self-pay | Admitting: Family Medicine

## 2022-06-22 DIAGNOSIS — E1159 Type 2 diabetes mellitus with other circulatory complications: Secondary | ICD-10-CM

## 2022-06-22 DIAGNOSIS — F3132 Bipolar disorder, current episode depressed, moderate: Secondary | ICD-10-CM | POA: Diagnosis not present

## 2022-06-23 ENCOUNTER — Encounter: Payer: Self-pay | Admitting: Family Medicine

## 2022-06-23 ENCOUNTER — Encounter (INDEPENDENT_AMBULATORY_CARE_PROVIDER_SITE_OTHER): Payer: Self-pay | Admitting: Family Medicine

## 2022-06-24 ENCOUNTER — Ambulatory Visit: Payer: Medicaid Other | Admitting: Family Medicine

## 2022-06-24 ENCOUNTER — Encounter (INDEPENDENT_AMBULATORY_CARE_PROVIDER_SITE_OTHER): Payer: Self-pay

## 2022-06-24 ENCOUNTER — Ambulatory Visit (INDEPENDENT_AMBULATORY_CARE_PROVIDER_SITE_OTHER): Payer: Medicaid Other | Admitting: Family Medicine

## 2022-06-24 NOTE — Telephone Encounter (Signed)
Appt has been cancelled.  

## 2022-06-26 ENCOUNTER — Other Ambulatory Visit: Payer: Self-pay | Admitting: Family Medicine

## 2022-06-26 ENCOUNTER — Ambulatory Visit: Payer: Medicaid Other

## 2022-06-26 LAB — TOXASSURE SELECT 13 (MW), URINE

## 2022-06-27 ENCOUNTER — Other Ambulatory Visit (HOSPITAL_COMMUNITY): Payer: Self-pay | Admitting: Psychiatry

## 2022-06-27 DIAGNOSIS — F3132 Bipolar disorder, current episode depressed, moderate: Secondary | ICD-10-CM | POA: Diagnosis not present

## 2022-06-27 NOTE — Telephone Encounter (Signed)
Please, let pt know that colchicine and atorvastatin should not be taken at the same time. Rx sent to her designated pharmacy

## 2022-07-01 ENCOUNTER — Telehealth (INDEPENDENT_AMBULATORY_CARE_PROVIDER_SITE_OTHER): Payer: Medicaid Other | Admitting: Psychiatry

## 2022-07-01 ENCOUNTER — Encounter (HOSPITAL_COMMUNITY): Payer: Self-pay | Admitting: Psychiatry

## 2022-07-01 ENCOUNTER — Telehealth: Payer: Self-pay | Admitting: Student in an Organized Health Care Education/Training Program

## 2022-07-01 DIAGNOSIS — F5105 Insomnia due to other mental disorder: Secondary | ICD-10-CM | POA: Diagnosis not present

## 2022-07-01 DIAGNOSIS — F418 Other specified anxiety disorders: Secondary | ICD-10-CM | POA: Diagnosis not present

## 2022-07-01 DIAGNOSIS — F900 Attention-deficit hyperactivity disorder, predominantly inattentive type: Secondary | ICD-10-CM | POA: Diagnosis not present

## 2022-07-01 DIAGNOSIS — F3130 Bipolar disorder, current episode depressed, mild or moderate severity, unspecified: Secondary | ICD-10-CM

## 2022-07-01 MED ORDER — AMPHETAMINE-DEXTROAMPHETAMINE 30 MG PO TABS: 30.0000 mg | ORAL_TABLET | Freq: Two times a day (BID) | ORAL | 0 refills | Status: DC

## 2022-07-01 MED ORDER — TRAZODONE HCL 50 MG PO TABS
50.0000 mg | ORAL_TABLET | Freq: Every day | ORAL | 2 refills | Status: DC
Start: 2022-07-01 — End: 2022-08-06

## 2022-07-01 MED ORDER — FLUOXETINE HCL 20 MG PO CAPS
20.0000 mg | ORAL_CAPSULE | Freq: Every day | ORAL | 2 refills | Status: DC
Start: 2022-07-01 — End: 2022-09-02

## 2022-07-01 MED ORDER — ALPRAZOLAM 1 MG PO TABS
1.0000 mg | ORAL_TABLET | Freq: Two times a day (BID) | ORAL | 2 refills | Status: DC | PRN
Start: 2022-07-01 — End: 2022-09-02

## 2022-07-01 MED ORDER — FLUOXETINE HCL 40 MG PO CAPS
40.0000 mg | ORAL_CAPSULE | Freq: Every day | ORAL | 2 refills | Status: DC
Start: 2022-07-01 — End: 2022-09-02

## 2022-07-01 MED ORDER — AMPHETAMINE-DEXTROAMPHETAMINE 30 MG PO TABS
1.0000 | ORAL_TABLET | Freq: Two times a day (BID) | ORAL | 0 refills | Status: DC
Start: 2022-07-01 — End: 2022-09-02

## 2022-07-01 NOTE — Telephone Encounter (Signed)
PA sent

## 2022-07-01 NOTE — Progress Notes (Signed)
Virtual Visit via Video Note  I connected with Jane [name] on 07/01/22 at 11:20 AM EDT by a video enabled telemedicine application and verified that I am speaking with the correct person using two identifiers.  Location: Patient: home Provider: office   I discussed the limitations of evaluation and management by telemedicine and the availability of in person appointments. The patient expressed understanding and agreed to proceed.      I discussed the assessment and treatment plan with the patient. The patient was provided an opportunity to ask questions and all were answered. The patient agreed with the plan and demonstrated an understanding of the instructions.   The patient was advised to call back or seek an in-person evaluation if the symptoms worsen or if the condition fails to improve as anticipated.  I provided 15 minutes of non-face-to-face time during this encounter.   [name], MD  [facility] MD/PA/NP OP Progress Note  07/01/2022 11:39 AM Jane [name]  MRN:  [id number]  Chief Complaint:  Chief Complaint  Patient presents with   Anxiety   Depression   Follow-up   HPI: This patient is a 56 year old divorced white female who lives alone in [place].  She is on disability due to bipolar disorder.  The patient returns for follow-up after 3 months.  She is having struggles with her family.  Her youngest daughter has been calling her and harassing her by her report.  She had to take out a restraining order last week.  She states that none of her 3 daughters speak to her C the grandchildren.  This has been very difficult.  She is still in close contact with her best friend and other friends.  She is not sleeping well and her mind will not shut off.  In the past she was on trazodone and perhaps we need to restart this.  The amitriptyline is not helping.  She also has a new blood clot in one of the stents in her legs which is causing her worry.  She is on Coumadin.  She states that  she feels tired a lot but does not think she is particularly depressed or anxious.  She denies any manic symptoms such as agitation.  She is focusing well with the Adderall. Visit Diagnosis:    ICD-10-CM   1. Bipolar I disorder, most recent episode depressed ([place])  F31.30     2. Attention deficit hyperactivity disorder (ADHD), predominantly inattentive type  F90.0     3. Insomnia due to mental disorder  F51.05       Past Psychiatric History: Long-term outpatient treatment  Past Medical History:  Past Medical History:  Diagnosis Date   ADD (attention deficit disorder)    Anxiety    Back pain    Bilateral swelling of feet    Bipolar 1 disorder (HCC)    Bipolar 1 disorder (HCC)    Bipolar disorder (HCC)    Chewing difficulty    Chronic fatigue syndrome    Chronic kidney disease    Stage 3 kidney disease;dx by Dr. Sinda Du.    Constipation    Depression    Diabetes mellitus without complication (HCC)    diet controlled   Dyspnea    with exertion   GERD (gastroesophageal reflux disease)    Headache    migraines   High cholesterol    History of blood clots    History of DVT (deep vein thrombosis)    left leg   Hypertension  states under control with meds., has been on med. x 2 years   Hypothyroidism    Joint pain    Neuropathy    Obsessive-compulsive disorder    Peripheral vascular disease ([place])    Prediabetes    Respiratory failure requiring intubation ([place])    Restless leg syndrome    Shortness of breath    Sleep apnea    Thrombocytopenia ([place]) 09/15/2019   Trigger thumb of left hand 01/2018   Trigger thumb of right hand     Past Surgical History:  Procedure Laterality Date   ABDOMINAL HYSTERECTOMY  06/2016   complete   APPLICATION OF WOUND VAC Left 04/27/2019   Procedure: APPLICATION OF WOUND VAC LEFT GROIN;  Surgeon: Waynetta Sandy, MD;  Location: [place];  Service: Vascular;  Laterality: Left;   AV FISTULA PLACEMENT Left 11/24/2018    Procedure: ARTERIOVENOUS (AV) FISTULA CREATION LEFT SFA TO LEFT FEMORAL VEIN;  Surgeon: Waynetta Sandy, MD;  Location: [place];  Service: Vascular;  Laterality: Left;   BACK SURGERY     CHOLECYSTECTOMY     COLONOSCOPY WITH PROPOFOL N/A 11/22/2019   Procedure: COLONOSCOPY WITH PROPOFOL;  Surgeon: [name], MD;  Location: [facility] ENDOSCOPY;  Service: Gastroenterology;  Laterality: N/A;   FEMORAL ARTERY EXPLORATION  03/30/2019   Procedure: Left Common Femoral Artery and Vein Exploration;  Surgeon: Waynetta Sandy, MD;  Location: [place];  Service: Vascular;;   FEMORAL-FEMORAL BYPASS GRAFT Left 11/24/2018   Procedure: BYPASS GRAFT FEMORAL-FEMORAL VENOUS LEFT TO RIGHT PALMA PROCEDURE USING CRYOVEIN;  Surgeon: Waynetta Sandy, MD;  Location: [place];  Service: Vascular;  Laterality: Left;   GROIN DEBRIDEMENT Left 04/27/2019   Procedure: GROIN DEBRIDEMENT;  Surgeon: Waynetta Sandy, MD;  Location: [place];  Service: Vascular;  Laterality: Left;   INSERTION OF ILIAC STENT  03/30/2019   Procedure: Stent of left common, external iliac veins and left common femoral vein;  Surgeon: Waynetta Sandy, MD;  Location: [place];  Service: Vascular;;   KNEE ARTHROSCOPY WITH MENISCAL REPAIR Left 11/14/2020   Procedure: LEFT KNEE ARTHROSCOPY WITH PARTIAL MEDIAL MENISCECTOMY;  Surgeon: Carole Civil, MD;  Location: [facility];  Service: Orthopedics;  Laterality: Left;   LOWER EXTREMITY VENOGRAPHY N/A 08/17/2018   Procedure: LOWER EXTREMITY VENOGRAPHY - Central Venogram;  Surgeon: Waynetta Sandy, MD;  Location: [place] [facility];  Service: Cardiovascular;  Laterality: N/A;   LOWER EXTREMITY VENOGRAPHY Bilateral 03/09/2019   Procedure: LOWER EXTREMITY VENOGRAPHY;  Surgeon: Waynetta Sandy, MD;  Location: [place] [facility];  Service: Cardiovascular;  Laterality: Bilateral;   LOWER EXTREMITY VENOGRAPHY Left 08/16/2019   Procedure: LOWER EXTREMITY VENOGRAPHY;   Surgeon: Waynetta Sandy, MD;  Location: [place] [facility];  Service: Cardiovascular;  Laterality: Left;   LOWER EXTREMITY VENOGRAPHY Left 03/25/2022   Procedure: LOWER EXTREMITY VENOGRAPHY;  Surgeon: Waynetta Sandy, MD;  Location: [place] [facility];  Service: Cardiovascular;  Laterality: Left;  IVUS   LUMBAR FUSION  11/21/2000   L5-S1   LUMBAR SPINE SURGERY     x 2 others   PATCH ANGIOPLASTY Left 03/30/2019   Procedure: Patch Angioplasty of the Left Common Femoral Vein using Venosure Biologic patch;  Surgeon: Waynetta Sandy, MD;  Location: [place];  Service: Vascular;  Laterality: Left;   PERIPHERAL VASCULAR INTERVENTION Left 08/16/2019   Procedure: PERIPHERAL VASCULAR INTERVENTION;  Surgeon: Waynetta Sandy, MD;  Location: [place] [facility];  Service: Cardiovascular;  Laterality: Left;  common femoral/femoral vein stent   PERIPHERAL VASCULAR INTERVENTION Left 03/25/2022   Procedure: PERIPHERAL VASCULAR INTERVENTION;  Surgeon: Waynetta Sandy, MD;  Location: [place] [facility];  Service: Cardiovascular;  Laterality: Left;  COMMON FEMORAL VEIN   PERIPHERAL VASCULAR THROMBECTOMY Left 03/25/2022   Procedure: PERIPHERAL VASCULAR THROMBECTOMY;  Surgeon: Waynetta Sandy, MD;  Location: [place] [facility];  Service: Cardiovascular;  Laterality: Left;  EXTREMITY/IVC   TRIGGER FINGER RELEASE Right 12/01/2017   Procedure: RELEASE TRIGGER FINGER/A-1 PULLEY RIGHT THUMB;  Surgeon: Leanora Cover, MD;  Location: [place];  Service: Orthopedics;  Laterality: Right;   TRIGGER FINGER RELEASE Left 01/26/2018   Procedure: LEFT TRIGGER THUMB RELEASE;  Surgeon: Leanora Cover, MD;  Location: [place];  Service: Orthopedics;  Laterality: Left;   ULTRASOUND GUIDANCE FOR VASCULAR ACCESS Right 03/30/2019   Procedure: Ultrasound-guided cannulation right internal jugular vein;  Surgeon: Waynetta Sandy, MD;  Location: [place];   Service: Vascular;  Laterality: Right;    Family Psychiatric History: see below  Family History:  Family History  Problem Relation Age of Onset   Heart disease Mother    Hyperlipidemia Mother    Hypertension Mother    Bipolar disorder Mother    Stroke Mother    Depression Mother    Sleep apnea Mother    Obesity Mother    Diabetes Father    Heart disease Father    Hyperlipidemia Father    Hypertension Father    Sleep apnea Father    Obesity Father    Drug abuse Daughter    ADD / ADHD Daughter    Drug abuse Daughter    Anxiety disorder Daughter    Bipolar disorder Daughter    Hypertension Sister    Hypertension Brother    Hyperlipidemia Brother    Heart disease Brother    Bipolar disorder Maternal Aunt    Suicidality Maternal Aunt     Social History:  Social History   Socioeconomic History   Marital status: Divorced    Spouse name: Not on file   Number of children: 3   Years of education: Not on file   Highest education level: Not on file  Occupational History   Not on file  Tobacco Use   Smoking status: Never   Smokeless tobacco: Never  Vaping Use   Vaping Use: Never used  Substance and Sexual Activity   Alcohol use: No   Drug use: No   Sexual activity: Not Currently    Partners: Male    Birth control/protection: Surgical  Other Topics Concern   Not on file  Social History Narrative   Not on file   Social Determinants of Health   Financial Resource Strain: High Risk (10/25/2020)   Overall Financial Resource Strain (CARDIA)    Difficulty of Paying Living Expenses: Hard  Food Insecurity: Food Insecurity Present (05/27/2022)   Hunger Vital Sign    Worried About Running Out of Food in the Last Year: Sometimes true    Ran Out of Food in the Last Year: Sometimes true  Transportation Needs: No Transportation Needs (05/27/2022)   PRAPARE - Hydrologist (Medical): No    Lack of Transportation (Non-Medical): No  Physical  Activity: Inactive (10/25/2020)   Exercise Vital Sign    Days of Exercise per Week: 0 days    Minutes of Exercise per Session: 0 min  Stress: Stress Concern Present (10/25/2020)   [employer] -  Occupational Stress Questionnaire    Feeling of Stress : Very much  Social Connections: Socially Isolated (10/25/2020)   Social Connection and Isolation Panel [NHANES]    Frequency of Communication with Friends and Family: More than three times a week    Frequency of Social Gatherings with Friends and Family: Three times a week    Attends Religious Services: Never    Active Member of Clubs or Organizations: No    Attends Archivist Meetings: Never    Marital Status: Divorced    Allergies:  Allergies  Allergen Reactions   Aripiprazole Other (See Comments)    BECOMES  VIOLENT    Seroquel [Quetiapine Fumarate] Other (See Comments)    BECOMES VIOLENT   Chlorpromazine Other (See Comments)    SEVERE ANXIETY    Gabapentin Other (See Comments)    NIGHTMARES     Metabolic Disorder Labs: Lab Results  Component Value Date   HGBA1C 5.8 (A) 04/22/2022   MPG 131.24 11/06/2021   MPG 134.11 03/26/2019   No results found for: "PROLACTIN" Lab Results  Component Value Date   CHOL 175 04/22/2022   TRIG 248 (H) 04/22/2022   HDL 38 (L) 04/22/2022   CHOLHDL 4.6 (H) 04/22/2022   VLDL 37 11/06/2021   LDLCALC 95 04/22/2022   LDLCALC 116 (H) 11/06/2021   Lab Results  Component Value Date   TSH 1.480 04/22/2022   TSH 0.107 (L) 05/18/2020    Therapeutic Level Labs: No results found for: "LITHIUM" Lab Results  Component Value Date   VALPROATE 61.2 03/23/2014   No results found for: "CBMZ"  Current Medications: Current Outpatient Medications  Medication Sig Dispense Refill   amphetamine-dextroamphetamine (ADDERALL) 30 MG tablet Take 1 tablet by mouth 2 (two) times daily. 60 tablet 0   acetic acid 2 % otic solution Place 4 drops into both ears every 3  (three) hours. 15 mL 0   acetic acid-hydrocortisone (VOSOL-HC) OTIC solution Place 3 drops into both ears 2 (two) times daily. 10 mL 0   ALPRAZolam (XANAX) 1 MG tablet Take 1 tablet (1 mg total) by mouth 2 (two) times daily as needed. for anxiety 60 tablet 2   amphetamine-dextroamphetamine (ADDERALL) 30 MG tablet Take 1 tablet by mouth 2 (two) times daily. 60 tablet 0   aspirin EC 81 MG tablet Take 81 mg by mouth daily.     aspirin-acetaminophen-caffeine (EXCEDRIN MIGRAINE) 250-250-65 MG tablet Take 2 tablets by mouth every 6 (six) hours as needed for headache.     atorvastatin (LIPITOR) 40 MG tablet TAKE 1 TABLET BY MOUTH EVERY DAY 90 tablet 3   cetirizine (ZYRTEC) 10 MG tablet TAKE 1 TABLET BY MOUTH DAILY 90 tablet 1   ciprofloxacin-dexamethasone (CIPRODEX) OTIC suspension Place 4 drops into both ears 2 (two) times daily. 7.5 mL 0   colchicine 0.6 MG tablet Take 1.'2mg'$  (2 tablets) then 0.'6mg'$  (1 tablet) 1 hour after. Then, take 1 tablet every day for 7 days. 10 tablet 2   FLUoxetine (PROZAC) 20 MG capsule Take 1 capsule (20 mg total) by mouth daily. Take with 40 mg to equal 60 mg daily 90 capsule 2   FLUoxetine (PROZAC) 40 MG capsule Take 1 capsule (40 mg total) by mouth daily. Take with 20 mg to equal 60 mg daily 90 capsule 2   GARLIC PO Take 1 tablet by mouth daily.     hydrochlorothiazide (HYDRODIURIL) 25 MG tablet TAKE 1 TABLET BY MOUTH DAILY Please schedule office visit BEFORE any future refills  90 tablet 1   HYDROcodone-acetaminophen (NORCO) 10-325 MG tablet Take 1 tablet by mouth every 6 (six) hours as needed. To last 30 days from fill date 120 tablet 0   [START ON 07/29/2022] HYDROcodone-acetaminophen (NORCO) 10-325 MG tablet Take 1 tablet by mouth every 6 (six) hours as needed. To last 30 days from fill date 120 tablet 0   [START ON 08/28/2022] HYDROcodone-acetaminophen (NORCO) 10-325 MG tablet Take 1 tablet by mouth every 6 (six) hours as needed. To last 30 days from fill date 120 tablet 0    levothyroxine (SYNTHROID) 125 MCG tablet Take 125 mcg by mouth daily before breakfast.     LINZESS 145 MCG CAPS capsule TAKE ONE CAPSULE BY MOUTH DAILY 90 capsule 3   lisinopril (ZESTRIL) 5 MG tablet Take 1 tablet (5 mg total) by mouth daily. 90 tablet 1   methocarbamol (ROBAXIN) 750 MG tablet Take 1 tablet (750 mg total) by mouth 2 (two) times daily as needed for muscle spasms. 60 tablet 5   Multiple Vitamin (MULTIVITAMIN) capsule Take 1 capsule by mouth daily.     Omega-3 Fatty Acids (OMEGA-3 FISH OIL PO) Take 1 capsule by mouth daily.     pantoprazole (PROTONIX) 40 MG tablet TAKE 1 TABLET BY MOUTH DAILY 90 tablet 3   potassium chloride (KLOR-CON M) 10 MEQ tablet TAKE 1 TABLET BY MOUTH THREE TIMES DAILY 270 tablet 1   pregabalin (LYRICA) 100 MG capsule TAKE ONE CAPSULE BY MOUTH FOUR TIMES DAILY 120 capsule 1   rOPINIRole (REQUIP) 2 MG tablet TAKE 1 TABLET BY MOUTH TWICE DAILY 180 tablet 3   Semaglutide, 1 MG/DOSE, 4 MG/3ML SOPN Inject 1 mg as directed once a week. 9 mL 0   traZODone (DESYREL) 50 MG tablet Take 1 tablet (50 mg total) by mouth at bedtime. 30 tablet 2   Vibegron (GEMTESA) 75 MG TABS Take 75 mg by mouth daily. 30 tablet 11   warfarin (COUMADIN) 5 MG tablet TAKE 1 AND 1/2 TABLETS BY MOUTH ON TUEDAYS AND THURSDAYS, THEN TAKE 1 TABLET DAILY ON ALL OTHER DAYS (Patient taking differently: Take 5 mg by mouth daily.) 60 tablet 3   No current facility-administered medications for this visit.     Musculoskeletal: Strength & Muscle Tone: within normal limits Gait & Station: normal Patient leans: N/A  Psychiatric Specialty Exam: Review of Systems  Constitutional:  Positive for fatigue.  Musculoskeletal:  Positive for back pain.  Psychiatric/Behavioral:  Positive for sleep disturbance.   All other systems reviewed and are negative.   There were no vitals taken for this visit.There is no height or weight on file to calculate BMI.  General Appearance: Casual and Fairly Groomed   Eye Contact:  Good  Speech:  Clear and Coherent  Volume:  Normal  Mood:  Dysphoric  Affect:  Flat  Thought Process:  Goal Directed  Orientation:  Full (Time, Place, and Person)  Thought Content: Rumination   Suicidal Thoughts:  No  Homicidal Thoughts:  No  Memory:  Immediate;   Good Recent;   Good Remote;   Good  Judgement:  Good  Insight:  Good  Psychomotor Activity:  Decreased  Concentration:  Concentration: Good and Attention Span: Good  Recall:  Good  Fund of Knowledge: Good  Language: Good  Akathisia:  No  Handed:  Right  AIMS (if indicated): not done  Assets:  Communication Skills Desire for Improvement Resilience Social Support  ADL's:  Intact  Cognition: WNL  Sleep:  Poor  Screenings: PHQ2-9    Flowsheet Row Video Visit from 07/01/2022 in [place] Office Visit from 06/20/2022 in [place] Office Visit from 06/11/2022 in [facility] Office Visit from 04/22/2022 in [facility] Video Visit from 04/08/2022 in [facility]  PHQ-2 Total Score 4 0 '4 2 1  '$ PHQ-9 Total Score 10 -- 16 9 --      Flowsheet Row Video Visit from 07/01/2022 in [place] Video Visit from 04/08/2022 in [place] Admission (Discharged) from 03/25/2022 in [place] CATH LAB  C-SSRS RISK CATEGORY No Risk No Risk No Risk        Assessment and Plan: This patient is a 56 year old female with a history of depression anxiety and ADD.  She is still not sleeping well so we will discontinue amitriptyline in favor of trazodone 50 mg at bedtime.  She will continue Prozac 60 mg daily for depression, Xanax 1 mg twice daily as needed for anxiety and Adderall 30 mg twice daily for ADD.  She will return to see me in 2 months  Collaboration of  Care: Collaboration of Care: Primary Care Provider AEB notes are available to PCP through the epic system  Patient/Guardian was advised Release of Information must be obtained prior to any record release in order to collaborate their care with an outside provider. Patient/Guardian was advised if they have not already done so to contact the registration department to sign all necessary forms in order for [place] to release information regarding their care.   Consent: Patient/Guardian gives verbal consent for treatment and assignment of benefits for services provided during this visit. Patient/Guardian expressed understanding and agreed to proceed.    [name], MD 07/01/2022, 11:39 AM

## 2022-07-01 NOTE — Telephone Encounter (Signed)
[facility] needs PA so she can get filled.

## 2022-07-03 ENCOUNTER — Ambulatory Visit: Payer: Medicaid Other

## 2022-07-03 DIAGNOSIS — Z86718 Personal history of other venous thrombosis and embolism: Secondary | ICD-10-CM | POA: Diagnosis not present

## 2022-07-03 LAB — POCT INR
INR: 2.9 (ref 2.0–3.0)
PT: 35.2

## 2022-07-03 NOTE — Patient Instructions (Signed)
Description   5 mg daily F/U in 3 weeks

## 2022-07-05 DIAGNOSIS — F3132 Bipolar disorder, current episode depressed, moderate: Secondary | ICD-10-CM | POA: Diagnosis not present

## 2022-07-06 DIAGNOSIS — F3132 Bipolar disorder, current episode depressed, moderate: Secondary | ICD-10-CM | POA: Diagnosis not present

## 2022-07-10 ENCOUNTER — Ambulatory Visit: Payer: Medicaid Other | Admitting: Podiatry

## 2022-07-10 DIAGNOSIS — F3132 Bipolar disorder, current episode depressed, moderate: Secondary | ICD-10-CM | POA: Diagnosis not present

## 2022-07-11 ENCOUNTER — Encounter (HOSPITAL_COMMUNITY): Payer: Self-pay

## 2022-07-11 DIAGNOSIS — M7989 Other specified soft tissue disorders: Secondary | ICD-10-CM | POA: Diagnosis not present

## 2022-07-11 DIAGNOSIS — Z79890 Hormone replacement therapy (postmenopausal): Secondary | ICD-10-CM | POA: Diagnosis not present

## 2022-07-11 DIAGNOSIS — E119 Type 2 diabetes mellitus without complications: Secondary | ICD-10-CM | POA: Diagnosis not present

## 2022-07-11 DIAGNOSIS — E0590 Thyrotoxicosis, unspecified without thyrotoxic crisis or storm: Secondary | ICD-10-CM | POA: Diagnosis not present

## 2022-07-11 DIAGNOSIS — Z7901 Long term (current) use of anticoagulants: Secondary | ICD-10-CM | POA: Diagnosis not present

## 2022-07-11 DIAGNOSIS — I10 Essential (primary) hypertension: Secondary | ICD-10-CM | POA: Diagnosis not present

## 2022-07-11 DIAGNOSIS — E785 Hyperlipidemia, unspecified: Secondary | ICD-10-CM | POA: Diagnosis not present

## 2022-07-11 DIAGNOSIS — Z7982 Long term (current) use of aspirin: Secondary | ICD-10-CM | POA: Diagnosis not present

## 2022-07-11 DIAGNOSIS — M79605 Pain in left leg: Secondary | ICD-10-CM | POA: Diagnosis not present

## 2022-07-11 DIAGNOSIS — Z86718 Personal history of other venous thrombosis and embolism: Secondary | ICD-10-CM | POA: Diagnosis not present

## 2022-07-12 ENCOUNTER — Ambulatory Visit: Payer: Medicaid Other | Admitting: Physician Assistant

## 2022-07-12 ENCOUNTER — Encounter: Payer: Self-pay | Admitting: Physician Assistant

## 2022-07-12 ENCOUNTER — Telehealth: Payer: Self-pay

## 2022-07-12 ENCOUNTER — Ambulatory Visit (HOSPITAL_COMMUNITY)
Admission: RE | Admit: 2022-07-12 | Discharge: 2022-07-12 | Disposition: A | Discharge status: Home / Self Care | Payer: Medicaid Other | Source: Ambulatory Visit | Attending: Vascular Surgery | Admitting: Vascular Surgery

## 2022-07-12 DIAGNOSIS — I871 Compression of vein: Secondary | ICD-10-CM

## 2022-07-12 DIAGNOSIS — I87002 Postthrombotic syndrome without complications of left lower extremity: Secondary | ICD-10-CM | POA: Diagnosis not present

## 2022-07-12 DIAGNOSIS — F3132 Bipolar disorder, current episode depressed, moderate: Secondary | ICD-10-CM | POA: Diagnosis not present

## 2022-07-12 NOTE — Progress Notes (Signed)
[facility] [place]   Reason for referral:   History of Present Illness  Jane [name] is a 56 y.o. 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  She is intermittently wearing compression stockings.  She has a history of chronic back pain with radicular pain down both legs.  She remains on Coumadin after failing DOAC therapy previously.  On her last visit 06/19/22 Dr. Donzetta Matters reviewed a CT venogram which demonstrates occlusion of her stents which occurred 6 weeks after her last procedure.  He discussed with her that we could return for repeat thrombectomy but I cannot guarantee her any long-term patency results and given that her leg at this time is really only mildly edematous and controlled with compression stockings and exercise and Coumadin.  He mentioned that she may be able to develop venous collaterals and further procedures may only make her worse in the long run.  Certainly if her symptoms worsen we will need to entertain repeat thrombectomy.  She states she has had increased edema over the last few days and was concerned about a new DVT.  She was seen at another healthcare facility and advice to come in today for repeat DVT study and exam.  She states she takes her Coumadin daily, elevates and wears her compression.  This am her swelling was reported to be better.  She is ambulatory.  She states she normally walks 10,000 steps a day.      Past Medical History:  Diagnosis Date   ADD (attention deficit disorder)    Anxiety    Back pain    Bilateral swelling of feet    Bipolar 1 disorder (HCC)    Bipolar 1 disorder (HCC)    Bipolar disorder (HCC)    Chewing difficulty    Chronic fatigue syndrome    Chronic kidney disease    Stage 3 kidney disease;dx by Dr. Sinda Du.     Constipation    Depression    Diabetes mellitus without complication (HCC)    diet controlled   Dyspnea    with exertion   GERD (gastroesophageal reflux disease)    Headache    migraines   High cholesterol    History of blood clots    History of DVT (deep vein thrombosis)    left leg   Hypertension    states under control with meds., has been on med. x 2 years   Hypothyroidism    Joint pain    Neuropathy    Obsessive-compulsive disorder    Peripheral vascular disease ([place])    Prediabetes    Respiratory failure requiring intubation ([place])    Restless leg syndrome    Shortness of breath    Sleep apnea    Thrombocytopenia ([place]) 09/15/2019   Trigger thumb of left hand 01/2018   Trigger thumb of right hand     Past Surgical History:  Procedure Laterality Date   ABDOMINAL HYSTERECTOMY  06/2016   complete   APPLICATION OF WOUND VAC Left 04/27/2019   Procedure: APPLICATION OF WOUND VAC LEFT GROIN;  Surgeon: Waynetta Sandy, MD;  Location: [facility];  Service: Vascular;  Laterality: Left;   AV FISTULA PLACEMENT Left 11/24/2018   Procedure: ARTERIOVENOUS (AV) FISTULA CREATION LEFT SFA TO LEFT FEMORAL VEIN;  Surgeon: Waynetta Sandy, MD;  Location: [place];  Service:  Vascular;  Laterality: Left;   BACK SURGERY     CHOLECYSTECTOMY     COLONOSCOPY WITH PROPOFOL N/A 11/22/2019   Procedure: COLONOSCOPY WITH PROPOFOL;  Surgeon: [name], MD;  Location: [facility] ENDOSCOPY;  Service: Gastroenterology;  Laterality: N/A;   FEMORAL ARTERY EXPLORATION  03/30/2019   Procedure: Left Common Femoral Artery and Vein Exploration;  Surgeon: Waynetta Sandy, MD;  Location: [place];  Service: Vascular;;   FEMORAL-FEMORAL BYPASS GRAFT Left 11/24/2018   Procedure: BYPASS GRAFT FEMORAL-FEMORAL VENOUS LEFT TO RIGHT PALMA PROCEDURE USING CRYOVEIN;  Surgeon: Waynetta Sandy, MD;  Location: [place];  Service: Vascular;  Laterality: Left;   GROIN DEBRIDEMENT Left 04/27/2019   Procedure:  GROIN DEBRIDEMENT;  Surgeon: Waynetta Sandy, MD;  Location: [place];  Service: Vascular;  Laterality: Left;   INSERTION OF ILIAC STENT  03/30/2019   Procedure: Stent of left common, external iliac veins and left common femoral vein;  Surgeon: Waynetta Sandy, MD;  Location: [place];  Service: Vascular;;   KNEE ARTHROSCOPY WITH MENISCAL REPAIR Left 11/14/2020   Procedure: LEFT KNEE ARTHROSCOPY WITH PARTIAL MEDIAL MENISCECTOMY;  Surgeon: Carole Civil, MD;  Location: [facility];  Service: Orthopedics;  Laterality: Left;   LOWER EXTREMITY VENOGRAPHY N/A 08/17/2018   Procedure: LOWER EXTREMITY VENOGRAPHY - Central Venogram;  Surgeon: Waynetta Sandy, MD;  Location: [place] [facility];  Service: Cardiovascular;  Laterality: N/A;   LOWER EXTREMITY VENOGRAPHY Bilateral 03/09/2019   Procedure: LOWER EXTREMITY VENOGRAPHY;  Surgeon: Waynetta Sandy, MD;  Location: [facility] [facility];  Service: Cardiovascular;  Laterality: Bilateral;   LOWER EXTREMITY VENOGRAPHY Left 08/16/2019   Procedure: LOWER EXTREMITY VENOGRAPHY;  Surgeon: Waynetta Sandy, MD;  Location: [place] [facility];  Service: Cardiovascular;  Laterality: Left;   LOWER EXTREMITY VENOGRAPHY Left 03/25/2022   Procedure: LOWER EXTREMITY VENOGRAPHY;  Surgeon: Waynetta Sandy, MD;  Location: [place] [facility];  Service: Cardiovascular;  Laterality: Left;  IVUS   LUMBAR FUSION  11/21/2000   L5-S1   LUMBAR SPINE SURGERY     x 2 others   PATCH ANGIOPLASTY Left 03/30/2019   Procedure: Patch Angioplasty of the Left Common Femoral Vein using Venosure Biologic patch;  Surgeon: Waynetta Sandy, MD;  Location: [place] [facility];  Service: Vascular;  Laterality: Left;   PERIPHERAL VASCULAR INTERVENTION Left 08/16/2019   Procedure: PERIPHERAL VASCULAR INTERVENTION;  Surgeon: Waynetta Sandy, MD;  Location: [facility] [facility];  Service: Cardiovascular;  Laterality: Left;  common femoral/femoral vein  stent   PERIPHERAL VASCULAR INTERVENTION Left 03/25/2022   Procedure: PERIPHERAL VASCULAR INTERVENTION;  Surgeon: Waynetta Sandy, MD;  Location: [place] [facility];  Service: Cardiovascular;  Laterality: Left;  COMMON FEMORAL VEIN   PERIPHERAL VASCULAR THROMBECTOMY Left 03/25/2022   Procedure: PERIPHERAL VASCULAR THROMBECTOMY;  Surgeon: Waynetta Sandy, MD;  Location: [place] [facility];  Service: Cardiovascular;  Laterality: Left;  EXTREMITY/IVC   TRIGGER FINGER RELEASE Right 12/01/2017   Procedure: RELEASE TRIGGER FINGER/A-1 PULLEY RIGHT THUMB;  Surgeon: Leanora Cover, MD;  Location: [facility];  Service: Orthopedics;  Laterality: Right;   TRIGGER FINGER RELEASE Left 01/26/2018   Procedure: LEFT TRIGGER THUMB RELEASE;  Surgeon: Leanora Cover, MD;  Location: [facility] [place];  Service: Orthopedics;  Laterality: Left;   ULTRASOUND GUIDANCE FOR VASCULAR ACCESS Right 03/30/2019   Procedure: Ultrasound-guided cannulation right internal jugular vein;  Surgeon: Waynetta Sandy, MD;  Location: [place];  Service: Vascular;  Laterality: Right;  Social History   Socioeconomic History   Marital status: Divorced    Spouse name: Not on file   Number of children: 3   Years of education: Not on file   Highest education level: Not on file  Occupational History   Not on file  Tobacco Use   Smoking status: Never   Smokeless tobacco: Never  Vaping Use   Vaping Use: Never used  Substance and Sexual Activity   Alcohol use: No   Drug use: No   Sexual activity: Not Currently    Partners: Male    Birth control/protection: Surgical  Other Topics Concern   Not on file  Social History Narrative   Not on file   Social Determinants of Health   Financial Resource Strain: High Risk (10/25/2020)   Overall Financial Resource Strain (CARDIA)    Difficulty of Paying Living Expenses: Hard  Food Insecurity: Food Insecurity Present (05/27/2022)   Hunger Vital  Sign    Worried About Running Out of Food in the Last Year: Sometimes true    Ran Out of Food in the Last Year: Sometimes true  Transportation Needs: No Transportation Needs (05/27/2022)   PRAPARE - Hydrologist (Medical): No    Lack of Transportation (Non-Medical): No  Physical Activity: Inactive (10/25/2020)   Exercise Vital Sign    Days of Exercise per Week: 0 days    Minutes of Exercise per Session: 0 min  Stress: Stress Concern Present (10/25/2020)   [place]    Feeling of Stress : Very much  Social Connections: Socially Isolated (10/25/2020)   Social Connection and Isolation Panel [NHANES]    Frequency of Communication with Friends and Family: More than three times a week    Frequency of Social Gatherings with Friends and Family: Three times a week    Attends Religious Services: Never    Active Member of Clubs or Organizations: No    Attends Archivist Meetings: Never    Marital Status: Divorced  Human resources officer Violence: Not At Risk (10/25/2020)   Humiliation, Afraid, Rape, and Kick questionnaire    Fear of Current or Ex-Partner: No    Emotionally Abused: No    Physically Abused: No    Sexually Abused: No    Family History  Problem Relation Age of Onset   Heart disease Mother    Hyperlipidemia Mother    Hypertension Mother    Bipolar disorder Mother    Stroke Mother    Depression Mother    Sleep apnea Mother    Obesity Mother    Diabetes Father    Heart disease Father    Hyperlipidemia Father    Hypertension Father    Sleep apnea Father    Obesity Father    Drug abuse Daughter    ADD / ADHD Daughter    Drug abuse Daughter    Anxiety disorder Daughter    Bipolar disorder Daughter    Hypertension Sister    Hypertension Brother    Hyperlipidemia Brother    Heart disease Brother    Bipolar disorder Maternal Aunt    Suicidality Maternal Aunt      Current Outpatient Medications on File Prior to Visit  Medication Sig Dispense Refill   acetic acid 2 % otic solution Place 4 drops into both ears every 3 (three) hours. 15 mL 0   acetic acid-hydrocortisone (VOSOL-HC) OTIC solution Place 3 drops into both ears 2 (two)  times daily. 10 mL 0   ALPRAZolam (XANAX) 1 MG tablet Take 1 tablet (1 mg total) by mouth 2 (two) times daily as needed. for anxiety 60 tablet 2   amphetamine-dextroamphetamine (ADDERALL) 30 MG tablet Take 1 tablet by mouth 2 (two) times daily. 60 tablet 0   amphetamine-dextroamphetamine (ADDERALL) 30 MG tablet Take 1 tablet by mouth 2 (two) times daily. 60 tablet 0   aspirin EC 81 MG tablet Take 81 mg by mouth daily.     aspirin-acetaminophen-caffeine (EXCEDRIN MIGRAINE) 250-250-65 MG tablet Take 2 tablets by mouth every 6 (six) hours as needed for headache.     atorvastatin (LIPITOR) 40 MG tablet TAKE 1 TABLET BY MOUTH EVERY DAY 90 tablet 3   cetirizine (ZYRTEC) 10 MG tablet TAKE 1 TABLET BY MOUTH DAILY 90 tablet 1   ciprofloxacin-dexamethasone (CIPRODEX) OTIC suspension Place 4 drops into both ears 2 (two) times daily. 7.5 mL 0   colchicine 0.6 MG tablet Take 1.'2mg'$  (2 tablets) then 0.'6mg'$  (1 tablet) 1 hour after. Then, take 1 tablet every day for 7 days. 10 tablet 2   FLUoxetine (PROZAC) 20 MG capsule Take 1 capsule (20 mg total) by mouth daily. Take with 40 mg to equal 60 mg daily 90 capsule 2   FLUoxetine (PROZAC) 40 MG capsule Take 1 capsule (40 mg total) by mouth daily. Take with 20 mg to equal 60 mg daily 90 capsule 2   GARLIC PO Take 1 tablet by mouth daily.     hydrochlorothiazide (HYDRODIURIL) 25 MG tablet TAKE 1 TABLET BY MOUTH DAILY Please schedule office visit BEFORE any future refills 90 tablet 1   HYDROcodone-acetaminophen (NORCO) 10-325 MG tablet Take 1 tablet by mouth every 6 (six) hours as needed. To last 30 days from fill date 120 tablet 0   [START ON 07/29/2022] HYDROcodone-acetaminophen (NORCO) 10-325 MG  tablet Take 1 tablet by mouth every 6 (six) hours as needed. To last 30 days from fill date 120 tablet 0   [START ON 08/28/2022] HYDROcodone-acetaminophen (NORCO) 10-325 MG tablet Take 1 tablet by mouth every 6 (six) hours as needed. To last 30 days from fill date 120 tablet 0   levothyroxine (SYNTHROID) 125 MCG tablet Take 125 mcg by mouth daily before breakfast.     LINZESS 145 MCG CAPS capsule TAKE ONE CAPSULE BY MOUTH DAILY 90 capsule 3   lisinopril (ZESTRIL) 5 MG tablet Take 1 tablet (5 mg total) by mouth daily. 90 tablet 1   methocarbamol (ROBAXIN) 750 MG tablet Take 1 tablet (750 mg total) by mouth 2 (two) times daily as needed for muscle spasms. 60 tablet 5   Multiple Vitamin (MULTIVITAMIN) capsule Take 1 capsule by mouth daily.     Omega-3 Fatty Acids (OMEGA-3 FISH OIL PO) Take 1 capsule by mouth daily.     pantoprazole (PROTONIX) 40 MG tablet TAKE 1 TABLET BY MOUTH DAILY 90 tablet 3   potassium chloride (KLOR-CON M) 10 MEQ tablet TAKE 1 TABLET BY MOUTH THREE TIMES DAILY 270 tablet 1   pregabalin (LYRICA) 100 MG capsule TAKE ONE CAPSULE BY MOUTH FOUR TIMES DAILY 120 capsule 1   rOPINIRole (REQUIP) 2 MG tablet TAKE 1 TABLET BY MOUTH TWICE DAILY 180 tablet 3   Semaglutide, 1 MG/DOSE, 4 MG/3ML SOPN Inject 1 mg as directed once a week. 9 mL 0   traZODone (DESYREL) 50 MG tablet Take 1 tablet (50 mg total) by mouth at bedtime. 30 tablet 2   Vibegron (GEMTESA) 75 MG TABS Take 75  mg by mouth daily. 30 tablet 11   warfarin (COUMADIN) 5 MG tablet TAKE 1 AND 1/2 TABLETS BY MOUTH ON TUEDAYS AND THURSDAYS, THEN TAKE 1 TABLET DAILY ON ALL OTHER DAYS (Patient taking differently: Take 5 mg by mouth daily.) 60 tablet 3   No current facility-administered medications on file prior to visit.    Allergies as of 07/12/2022 - Review Complete 07/01/2022  Allergen Reaction Noted   Aripiprazole Other (See Comments) 04/05/2013   Seroquel [quetiapine fumarate] Other (See Comments) 06/17/2013   Chlorpromazine  Other (See Comments) 04/05/2013   Gabapentin Other (See Comments) 04/05/2013     ROS:   General:  No weight loss, Fever, chills  HEENT: No recent headaches, no nasal bleeding, no visual changes, no sore throat  Neurologic: No dizziness, blackouts, seizures. No recent symptoms of stroke or mini- stroke. No recent episodes of slurred speech, or temporary blindness.  Cardiac: No recent episodes of chest pain/pressure, no shortness of breath at rest.  No shortness of breath with exertion.  Denies history of atrial fibrillation or irregular heartbeat  Vascular: No history of rest pain in feet.  No history of claudication.  No history of non-healing ulcer, No history of DVT   Pulmonary: No home oxygen, no productive cough, no hemoptysis,  No asthma or wheezing  Musculoskeletal:  '[ ]'$  Arthritis, '[ ]'$  Low back pain,  '[ ]'$  Joint pain  Hematologic:No history of hypercoagulable state.  No history of easy bleeding.  No history of anemia  Gastrointestinal: No hematochezia or melena,  No gastroesophageal reflux, no trouble swallowing  Urinary: '[ ]'$  chronic Kidney disease, '[ ]'$  on HD - '[ ]'$  MWF or '[ ]'$  TTHS, '[ ]'$  Burning with urination, '[ ]'$  Frequent urination, '[ ]'$  Difficulty urinating;   Skin: No rashes  Psychological: No history of anxiety,  No history of depression  Physical Examination    General:  Alert and oriented, no acute distress HEENT: Normal Neck: No bruit or JVD Pulmonary: Clear to auscultation bilaterally Cardiac: Regular Rate and Rhythm without murmur Abdomen: Soft, non-tender, non-distended, no mass, no scars Skin: No rash, petechial rash B lower legs.  No open wounds Extremity Pulses: dorsalis pedis pulses bilaterally.  Compartments soft. Musculoskeletal: moderate edema in the left LE compared to the right  Neurologic: Upper and lower extremity motor  and symmetric  DATA:  +---------+---------------+---------+-----------+----------+---------------  ---+  LEFT      CompressibilityPhasicitySpontaneityPropertiesThrombus Aging       +---------+---------------+---------+-----------+----------+---------------  ---+  CFV      Stent          No       No                                        +---------+---------------+---------+-----------+----------+---------------  ---+  FV Prox  Full           No       No                   small &  contracted                                                        vein                 +---------+---------------+---------+-----------+----------+---------------  ---+  FV Mid   Full           No       No                                        +---------+---------------+---------+-----------+----------+---------------  ---+  FV DistalFull           No       No                                        +---------+---------------+---------+-----------+----------+---------------  ---+  PFV      Full           No       No                   NWV                   +---------+---------------+---------+-----------+----------+---------------  ---+  POP      Full           Yes      Yes                                       +---------+---------------+---------+-----------+----------+---------------  ---+  PTV      Full           Yes      Yes                                       +---------+---------------+---------+-----------+----------+---------------  ---+  PERO     Full           Yes      Yes                                       +---------+---------------+---------+-----------+----------+---------------  ---+  Gastroc  Full           Yes      Yes                                       +---------+---------------+---------+-----------+----------+---------------  ---+  GSV      Full           Yes      Yes                                       +---------+---------------+---------+-----------+----------+---------------  ---+  SSV       Partial        Yes      Yes                  thick walls           +---------+---------------+---------+-----------+----------+---------------  ---+          Summary:   LEFT:  - There is no evidence of visualized deep vein thrombosis in the  lower  extremity.  - Known occluded CFV stent with small contracted proximal femoral vein.  - Proximal small saphenous vein has thickened walls.     Assessment:  56 y.o. 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   CT venogram demonstrates occlusion of her stents on her last visit 06/19/22.  She has no new evidence of DVT with known occluded CFV stent.  She has a palpable DP pulse, compartments are soft, she is ambulatory.  No ischemic changes.  Mild edema today on exam.   Plan: F/u in 5 months for regular appointment to re evaluate patency of her femoral veins per Dr. Jamse Mead note on 06/19/22.  She will continue to take Coumadin as directed, elevation and wear compression daily.  Roxy Horseman PA-C [facility] [place] Office: [phone]  MD in clinic [place]

## 2022-07-12 NOTE — Telephone Encounter (Signed)
Pt walked into office with d/c note from [facility] ED from 9/7 visit stating for her to be seen today to r/o DVT. Dr Stanford Breed was paged on-call by ED. Paged Dr Stanford Breed, spoke with him and received verbal orders to W/I for DVT US and provider visit. Appts scheduled and order placed. Pt to see Gwenette Greet, who has been made aware.

## 2022-07-13 DIAGNOSIS — F3132 Bipolar disorder, current episode depressed, moderate: Secondary | ICD-10-CM | POA: Diagnosis not present

## 2022-07-14 ENCOUNTER — Emergency Department (HOSPITAL_COMMUNITY)
Admission: EM | Admit: 2022-07-14 | Discharge: 2022-07-14 | Disposition: A | Discharge status: Home / Self Care | Payer: Medicaid Other | Attending: Emergency Medicine | Admitting: Emergency Medicine

## 2022-07-14 ENCOUNTER — Other Ambulatory Visit: Payer: Self-pay

## 2022-07-14 ENCOUNTER — Encounter (HOSPITAL_COMMUNITY): Payer: Self-pay

## 2022-07-14 DIAGNOSIS — Z7982 Long term (current) use of aspirin: Secondary | ICD-10-CM | POA: Insufficient documentation

## 2022-07-14 DIAGNOSIS — M79605 Pain in left leg: Secondary | ICD-10-CM | POA: Diagnosis not present

## 2022-07-14 LAB — CBC WITH DIFFERENTIAL/PLATELET
Abs Immature Granulocytes: 0.01 10*3/uL (ref 0.00–0.07)
Basophils Absolute: 0 10*3/uL (ref 0.0–0.1)
Basophils Relative: 0 %
Eosinophils Absolute: 0.1 10*3/uL (ref 0.0–0.5)
Eosinophils Relative: 1 %
HCT: 37.6 % (ref 36.0–46.0)
Hemoglobin: 12.7 g/dL (ref 12.0–15.0)
Immature Granulocytes: 0 %
Lymphocytes Relative: 23 %
Lymphs Abs: 1.1 10*3/uL (ref 0.7–4.0)
MCH: 31.3 pg (ref 26.0–34.0)
MCHC: 33.8 g/dL (ref 30.0–36.0)
MCV: 92.6 fL (ref 80.0–100.0)
Monocytes Absolute: 0.3 10*3/uL (ref 0.1–1.0)
Monocytes Relative: 7 %
Neutro Abs: 3 10*3/uL (ref 1.7–7.7)
Neutrophils Relative %: 69 %
Platelets: 120 10*3/uL — ABNORMAL LOW (ref 150–400)
RBC: 4.06 MIL/uL (ref 3.87–5.11)
RDW: 13.1 % (ref 11.5–15.5)
WBC: 4.5 10*3/uL (ref 4.0–10.5)
nRBC: 0 % (ref 0.0–0.2)

## 2022-07-14 LAB — BASIC METABOLIC PANEL
Anion gap: 9 (ref 5–15)
BUN: 35 mg/dL — ABNORMAL HIGH (ref 6–20)
CO2: 28 mmol/L (ref 22–32)
Calcium: 9.7 mg/dL (ref 8.9–10.3)
Chloride: 101 mmol/L (ref 98–111)
Creatinine, Ser: 1.63 mg/dL — ABNORMAL HIGH (ref 0.44–1.00)
GFR, Estimated: 37 mL/min — ABNORMAL LOW (ref >60)
Glucose, Bld: 110 mg/dL — ABNORMAL HIGH (ref 70–99)
Potassium: 4.1 mmol/L (ref 3.5–5.1)
Sodium: 138 mmol/L (ref 135–145)

## 2022-07-14 LAB — PROTIME-INR
INR: 2.1 — ABNORMAL HIGH (ref 0.8–1.2)
Prothrombin Time: 23.1 seconds — ABNORMAL HIGH (ref 11.4–15.2)

## 2022-07-14 MED ORDER — HYDROMORPHONE HCL 1 MG/ML IJ SOLN
1.0000 mg | Freq: Once | INTRAMUSCULAR | Status: AC
  Administered 2022-07-14: 1 mg via INTRAMUSCULAR
  Filled 2022-07-14: qty 1

## 2022-07-14 NOTE — ED Provider Notes (Signed)
[facility] EMERGENCY DEPARTMENT Provider Note   CSN: [id number] Arrival date & time: 07/14/22  1518     History {Add pertinent medical, surgical, social history, OB history to HPI:1} Chief Complaint  Patient presents with   Joint Pain    Jane [name] is a 56 y.o. female.  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  Her leg seems to be swelling more.  She was seen by the vascular surgeon on Friday and they stated that they could do embolectomy of that stent but she right now does not want that done.  She returns with continued swelling in that leg   Leg Pain      Home Medications Prior to Admission medications   Medication Sig Start Date End Date Taking? Authorizing Provider  acetic acid 2 % otic solution Place 4 drops into both ears every 3 (three) hours. 06/20/22   Ostwalt, Letitia Libra, PA-C  acetic acid-hydrocortisone (VOSOL-HC) OTIC solution Place 3 drops into both ears 2 (two) times daily. 06/20/22   Ostwalt, Letitia Libra, PA-C  ALPRAZolam (XANAX) 1 MG tablet Take 1 tablet (1 mg total) by mouth 2 (two) times daily as needed. for anxiety 07/01/22   Cloria Spring, MD  amphetamine-dextroamphetamine (ADDERALL) 30 MG tablet Take 1 tablet by mouth 2 (two) times daily. 07/01/22   Cloria Spring, MD  amphetamine-dextroamphetamine (ADDERALL) 30 MG tablet Take 1 tablet by mouth 2 (two) times daily. 07/01/22 07/01/23  Cloria Spring, MD  aspirin EC 81 MG tablet Take 81 mg by mouth daily.    [provider]  aspirin-acetaminophen-caffeine (EXCEDRIN MIGRAINE) [phone] MG tablet Take 2 tablets by mouth every 6 (six) hours as needed for headache.    [provider]  atorvastatin (LIPITOR) 40 MG tablet TAKE 1 TABLET BY MOUTH EVERY DAY 06/27/22   Ostwalt, Letitia Libra, PA-C  cetirizine (ZYRTEC) 10 MG tablet TAKE 1 TABLET BY MOUTH DAILY 05/31/22   Bacigalupo, Dionne Bucy, MD  ciprofloxacin-dexamethasone (CIPRODEX) OTIC suspension Place 4 drops into both ears 2 (two)  times daily. 06/20/22   Mardene Speak, PA-C  colchicine 0.6 MG tablet Take 1.'2mg'$  (2 tablets) then 0.'6mg'$  (1 tablet) 1 hour after. Then, take 1 tablet every day for 7 days. 05/15/22   McDonald, Stephan Minister, DPM  FLUoxetine (PROZAC) 20 MG capsule Take 1 capsule (20 mg total) by mouth daily. Take with 40 mg to equal 60 mg daily 07/01/22 07/01/23  Cloria Spring, MD  FLUoxetine (PROZAC) 40 MG capsule Take 1 capsule (40 mg total) by mouth daily. Take with 20 mg to equal 60 mg daily 07/01/22   Cloria Spring, MD  GARLIC PO Take 1 tablet by mouth daily.    [provider]  hydrochlorothiazide (HYDRODIURIL) 25 MG tablet TAKE 1 TABLET BY MOUTH DAILY Please schedule office visit BEFORE any future refills 06/24/22   Myles Gip, DO  HYDROcodone-acetaminophen (NORCO) 10-325 MG tablet Take 1 tablet by mouth every 6 (six) hours as needed. To last 30 days from fill date 06/29/22 07/29/22  Gillis Santa, MD  HYDROcodone-acetaminophen (NORCO) 10-325 MG tablet Take 1 tablet by mouth every 6 (six) hours as needed. To last 30 days from fill date 07/29/22 08/28/22  Gillis Santa, MD  HYDROcodone-acetaminophen (NORCO) 10-325 MG tablet Take 1 tablet by mouth every 6 (six) hours as needed. To last 30 days from fill date 08/28/22 09/27/22  Gillis Santa, MD  levothyroxine (SYNTHROID) 125 MCG tablet Take 125 mcg by mouth daily before  breakfast.    [provider]  LINZESS 145 MCG CAPS capsule TAKE ONE CAPSULE BY MOUTH DAILY 06/27/22   Ostwalt, Letitia Libra, PA-C  lisinopril (ZESTRIL) 5 MG tablet Take 1 tablet (5 mg total) by mouth daily. 04/22/22   Bacigalupo, Dionne Bucy, MD  methocarbamol (ROBAXIN) 750 MG tablet Take 1 tablet (750 mg total) by mouth 2 (two) times daily as needed for muscle spasms. 03/26/22   Gillis Santa, MD  Multiple Vitamin (MULTIVITAMIN) capsule Take 1 capsule by mouth daily.    [provider]  Omega-3 Fatty Acids (OMEGA-3 FISH OIL PO) Take 1 capsule by mouth daily.    [provider]   pantoprazole (PROTONIX) 40 MG tablet TAKE 1 TABLET BY MOUTH DAILY 01/31/22   [name], MD  potassium chloride (KLOR-CON M) 10 MEQ tablet TAKE 1 TABLET BY MOUTH THREE TIMES DAILY 06/24/22   Myles Gip, DO  pregabalin (LYRICA) 100 MG capsule TAKE ONE CAPSULE BY MOUTH FOUR TIMES DAILY 04/30/22   Brita Romp, Dionne Bucy, MD  rOPINIRole (REQUIP) 2 MG tablet TAKE 1 TABLET BY MOUTH TWICE DAILY 03/26/22   [name], MD  Semaglutide, 1 MG/DOSE, 4 MG/3ML SOPN Inject 1 mg as directed once a week. 05/20/22   Laqueta Linden, MD  traZODone (DESYREL) 50 MG tablet Take 1 tablet (50 mg total) by mouth at bedtime. 07/01/22   Cloria Spring, MD  Vibegron (GEMTESA) 75 MG TABS Take 75 mg by mouth daily. 08/15/21   Hollice Espy, MD  warfarin (COUMADIN) 5 MG tablet TAKE 1 AND 1/2 TABLETS BY MOUTH ON TUEDAYS AND THURSDAYS, THEN TAKE 1 TABLET DAILY ON ALL OTHER DAYS Patient taking differently: Take 5 mg by mouth daily. 02/25/22   Birdie Sons, MD      Allergies    Aripiprazole, Seroquel [quetiapine fumarate], Chlorpromazine, and Gabapentin    Review of Systems   Review of Systems  Physical Exam Updated Vital Signs BP 110/74   Pulse 63   Temp 98.4 F (36.9 C)   Resp 16   Ht '5\' 10"'$  (1.778 m)   Wt 108.9 kg   LMP  (LMP Unknown)   SpO2 91%   BMI 34.44 kg/m  Physical Exam  ED Results / Procedures / Treatments   Labs (all labs ordered are listed, but only abnormal results are displayed) Labs Reviewed  CBC WITH DIFFERENTIAL/PLATELET - Abnormal; Notable for the following components:      Result Value   Platelets 120 (*)    All other components within normal limits  BASIC METABOLIC PANEL - Abnormal; Notable for the following components:   Glucose, Bld 110 (*)    BUN 35 (*)    Creatinine, Ser 1.63 (*)    GFR, Estimated 37 (*)    All other components within normal limits  PROTIME-INR - Abnormal; Notable for the following components:   Prothrombin Time 23.1 (*)    INR  2.1 (*)    All other components within normal limits    EKG None  Radiology No results found.  Procedures Procedures  {Document cardiac monitor, telemetry assessment procedure when appropriate:1}  Medications Ordered in ED Medications  HYDROmorphone (DILAUDID) injection 1 mg (1 mg Intramuscular Given 07/14/22 1606)    ED Course/ Medical Decision Making/ A&P                           Medical Decision Making Amount and/or Complexity of Data Reviewed Labs: ordered.  Risk Prescription drug management.   Labs unremarkable.  Patient with swelling to left leg most likely secondary to the clot of the left common femoral vein stent.  She is told to follow back up with vascular surgery that she saw on Friday  {Document critical care time when appropriate:1} {Document review of labs and clinical decision tools ie heart score, Chads2Vasc2 etc:1}  {Document your independent review of radiology images, and any outside records:1} {Document your discussion with family members, caretakers, and with consultants:1} {Document social determinants of health affecting pt's care:1} {Document your decision making why or why not admission, treatments were needed:1} Final Clinical Impression(s) / ED Diagnoses Final diagnoses:  Left leg pain    Rx / DC Orders ED Discharge Orders     None

## 2022-07-14 NOTE — Discharge Instructions (Signed)
Follow-up with your vein doctors if you are interested in getting a procedure to take the clot out of your stent in her left leg.  Or follow-up with them if the leg is getting much worse

## 2022-07-14 NOTE — ED Triage Notes (Signed)
Pt presents to ED with joint pain, rash on bilateral legs x 1 week. Pt was seen at [facility] and sent to Vein and Vascular specialist, was told she didn't have a blockage. Pt reports getting worse, and pain with walking.

## 2022-07-15 ENCOUNTER — Telehealth: Payer: Self-pay | Admitting: *Deleted

## 2022-07-15 NOTE — Patient Outreach (Signed)
Care Coordination  07/15/2022  Jane [name] 10-Apr-1966 [id number]   Transition Care Management Unsuccessful Follow-up Telephone Call  Date of discharge and from where:  07/14/22 from [facility]  Attempts:  1st Attempt  Reason for unsuccessful TCM follow-up call:  Left voice message   Lurena Joiner RN, [place] RN Care Coordinator

## 2022-07-17 ENCOUNTER — Other Ambulatory Visit: Payer: Self-pay

## 2022-07-17 DIAGNOSIS — N3001 Acute cystitis with hematuria: Secondary | ICD-10-CM

## 2022-07-17 MED ORDER — GEMTESA 75 MG PO TABS: 75.0000 mg | ORAL_TABLET | Freq: Every day | ORAL | 3 refills | Status: DC

## 2022-07-18 ENCOUNTER — Telehealth: Payer: Self-pay | Admitting: Obstetrics and Gynecology

## 2022-07-18 NOTE — Patient Outreach (Signed)
  Care Coordination [facility] - Inpatient Note Transition Care Management Unsuccessful Follow-up Telephone Call  Date of discharge and from where:  07/14/22 from [facility]  Attempts:  2nd Attempt  Reason for unsuccessful TCM follow-up call:  No answer/busy  Aida Raider RN, [place] Management Coordinator - Managed [place] High Risk [phone]

## 2022-07-19 ENCOUNTER — Other Ambulatory Visit: Payer: Self-pay

## 2022-07-19 DIAGNOSIS — F3132 Bipolar disorder, current episode depressed, moderate: Secondary | ICD-10-CM | POA: Diagnosis not present

## 2022-07-19 NOTE — Patient Outreach (Signed)
Care Coordination  07/19/2022  Jane [name] [birth year] [id number]   Medicaid Managed Care   Unsuccessful Outreach Note  07/19/2022 Name: Jane [name] MRN: [id number] DOB: 1965/11/12  Referred by: [name], MD Reason for referral : High Risk Managed Medicaid (MM [employer])   An unsuccessful telephone outreach was attempted today. The patient was referred to the case management team for assistance with care management and care coordination.   Follow Up Plan: The patient has been provided with contact information for the care management team and has been advised to call with any health related questions or concerns.   Mickel Fuchs, BSW, [place] Managed Medicaid Team  [phone]

## 2022-07-19 NOTE — Patient Instructions (Signed)
Visit Information  Ms. Alithea Lapage  - as a part of your Medicaid benefit, you are eligible for care management and care coordination services at no cost or copay. I was unable to reach you by phone today but would be happy to help you with your health related needs. Please feel free to call me @ [phone]).     Mickel Fuchs, BSW, [place] Managed Medicaid Team  [phone]

## 2022-07-20 DIAGNOSIS — F3132 Bipolar disorder, current episode depressed, moderate: Secondary | ICD-10-CM | POA: Diagnosis not present

## 2022-07-22 ENCOUNTER — Ambulatory Visit (INDEPENDENT_AMBULATORY_CARE_PROVIDER_SITE_OTHER): Payer: Medicaid Other | Admitting: Family Medicine

## 2022-07-22 ENCOUNTER — Encounter (INDEPENDENT_AMBULATORY_CARE_PROVIDER_SITE_OTHER): Payer: Self-pay | Admitting: Family Medicine

## 2022-07-22 VITALS — BP 126/75 | HR 61 | Temp 98.0°F | Ht 70.0 in | Wt 244.0 lb

## 2022-07-22 DIAGNOSIS — Z7985 Long-term (current) use of injectable non-insulin antidiabetic drugs: Secondary | ICD-10-CM | POA: Diagnosis not present

## 2022-07-22 DIAGNOSIS — E669 Obesity, unspecified: Secondary | ICD-10-CM

## 2022-07-22 DIAGNOSIS — E1165 Type 2 diabetes mellitus with hyperglycemia: Secondary | ICD-10-CM

## 2022-07-22 DIAGNOSIS — Z6841 Body Mass Index (BMI) 40.0 and over, adult: Secondary | ICD-10-CM

## 2022-07-22 DIAGNOSIS — Z6835 Body mass index (BMI) 35.0-35.9, adult: Secondary | ICD-10-CM

## 2022-07-23 NOTE — Progress Notes (Signed)
Chief Complaint:   OBESITY [name] is here to discuss her progress with her obesity treatment plan along with follow-up of her obesity related diagnoses. Jane [name] is on the Category 4 Plan and states she is following her eating plan approximately 50% of the time. Jane [name] states she is walking 10,000 steps 4 times per week.  Today's visit was #: 46 Starting weight: 283 lbs Starting date: 03/29/2020 Today's weight: 244 lbs Today's date: 9/18/20232 Total lbs lost to date: 39 lbs Total lbs lost since last in-office visit: 2  Interim History: Jane [name] has had 2 ED visits since last appointment for leg pain over concerns of a blood clot. She has had to go to court for a 50b against her daughter. She did get a car and is awaiting to find out what is wrong with it. A friend is staying with her and unfortunately brings less nutritious more indulgent food around. Going to [facility] with senior center in [place]. Doing some emotional eating.   Subjective:   1. Type 2 diabetes mellitus with hyperglycemia, without long-term current use of insulin (HCC) Jane [name] mentions she will be getting Multimedia [employer]. Her last [name] 6.2, on Ozempic 1 mg.  Assessment/Plan:   1. Type 2 diabetes mellitus with hyperglycemia, without long-term current use of insulin (HCC) Continue Ozempic without changes in dose or medication.   2. Obesity with current BMI of 35.0 Jane [name] is currently in the action stage of change. As such, her goal is to continue with weight loss efforts. She has agreed to the Category 4 Plan.   Exercise goals: All adults should avoid inactivity. Some physical activity is better than none, and adults who participate in any amount of physical activity gain some health benefits.  Behavioral modification strategies: increasing lean protein intake, meal planning and cooking strategies, keeping healthy foods in the home, and planning for success.  Jane [name] has agreed to follow-up with our clinic in 4  weeks. She was informed of the importance of frequent follow-up visits to maximize her success with intensive lifestyle modifications for her multiple health conditions.   Objective:   Blood pressure 126/75, pulse 61, temperature 98 F (36.7 C), height '5\' 10"'$  (1.778 m), weight 244 lb (110.7 kg), SpO2 100 %. Body mass index is 35.01 kg/m.  General: Cooperative, alert, well developed, in no acute distress. HEENT: Conjunctivae and lids unremarkable. Cardiovascular: Regular rhythm.  Lungs: Normal work of breathing. Neurologic: No focal deficits.   Lab Results  Component Value Date   CREATININE 1.63 (H) 07/14/2022   BUN 35 (H) 07/14/2022   NA 138 07/14/2022   K 4.1 07/14/2022   CL 101 07/14/2022   CO2 28 07/14/2022   Lab Results  Component Value Date   ALT 21 04/22/2022   AST 16 04/22/2022   ALKPHOS 107 04/22/2022   BILITOT 0.2 04/22/2022   Lab Results  Component Value Date   HGBA1C 5.8 (A) 04/22/2022   HGBA1C 6.2 (H) 11/06/2021   HGBA1C 6.3 (H) 03/22/2021   HGBA1C 5.8 (A) 10/24/2020   HGBA1C 6.2 (H) 03/29/2020   Lab Results  Component Value Date   INSULIN 21.5 03/29/2020   Lab Results  Component Value Date   TSH 1.480 04/22/2022   Lab Results  Component Value Date   CHOL 175 04/22/2022   HDL 38 (L) 04/22/2022   LDLCALC 95 04/22/2022   TRIG 248 (H) 04/22/2022   CHOLHDL 4.6 (H) 04/22/2022   Lab Results  Component Value Date   VD25OH  47.10 02/21/2022   VD25OH 50.60 01/01/2021   VD25OH 45.30 10/17/2020   Lab Results  Component Value Date   WBC 4.5 07/14/2022   HGB 12.7 07/14/2022   HCT 37.6 07/14/2022   MCV 92.6 07/14/2022   PLT 120 (L) 07/14/2022   Lab Results  Component Value Date   IRON 87 02/21/2022   TIBC 300 02/21/2022   FERRITIN 133 02/21/2022   Attestation Statements:   Reviewed by clinician on day of visit: allergies, medications, problem list, medical history, surgical history, family history, social history, and previous encounter  notes.  I, Elnora Morrison, RMA am acting as transcriptionist for Coralie Common, MD.  I have reviewed the above documentation for accuracy and completeness, and I agree with the above. - Coralie Common, MD

## 2022-07-24 ENCOUNTER — Ambulatory Visit: Payer: Medicaid Other | Admitting: Physician Assistant

## 2022-07-24 DIAGNOSIS — Z86718 Personal history of other venous thrombosis and embolism: Secondary | ICD-10-CM

## 2022-07-24 LAB — POCT INR
INR: 2.5 (ref 2.0–3.0)
PT: 30.4

## 2022-07-24 NOTE — Patient Instructions (Signed)
Description   5 mg daily F/U in 4 weeks

## 2022-07-29 ENCOUNTER — Other Ambulatory Visit: Payer: Self-pay | Admitting: *Deleted

## 2022-07-29 NOTE — Patient Outreach (Signed)
Medicaid Managed Care   Nurse Care Manager Note  07/29/2022 Name:  Jane [name] MRN:  [id number] DOB:  12/24/1965  Jane [name] is an 56 y.o. year old female who is a primary patient of Bacigalupo, Dionne Bucy, MD.  The [name] Managed Care Coordination team was consulted for assistance with:    HLD DVT  Jane [name] was given information about Medicaid Managed Care Coordination team services today. Jane [name] Patient agreed to services and verbal consent obtained.  Engaged with patient by telephone for follow up visit in response to provider referral for case management and/or care coordination services.   Assessments/Interventions:  Review of past medical history, allergies, medications, health status, including review of consultants reports, laboratory and other test data, was performed as part of comprehensive evaluation and provision of chronic care management services.  SDOH (Social Determinants of Health) assessments and interventions performed: SDOH Interventions    Flowsheet Row Office Visit from 06/11/2022 in [place] Patient Outreach Telephone from 05/27/2022 in [place] Coordination Office Visit from 10/25/2020 in [place] Office Visit from 10/25/2019 in [place] Interventions      Food Insecurity Interventions -- Other (Comment)  Jane [name] Guide Referral for food resources] -- --  Housing Interventions -- Intervention Not Indicated -- --  Transportation Interventions -- Intervention Not Indicated Intervention Not Indicated --  Depression Interventions/Treatment  Counseling, Currently on Treatment -- -- Currently on Treatment       Care Plan  Allergies  Allergen Reactions   Aripiprazole Other (See Comments)    BECOMES  VIOLENT    Seroquel [Quetiapine Fumarate] Other (See Comments)    BECOMES VIOLENT   Chlorpromazine Other (See Comments)    SEVERE ANXIETY    Gabapentin Other  (See Comments)    NIGHTMARES     Medications Reviewed Today     Reviewed by Jane Montane, RN (Registered Nurse) on 07/29/22 at [place] List Status: <None>   Medication Order Taking? Sig Documenting Provider Last Dose Status Informant  acetic acid 2 % otic solution [id number] No Place 4 drops into both ears every 3 (three) hours.  Patient not taking: Reported on 07/29/2022   Mardene Speak, PA-C Not Taking Active   acetic acid-hydrocortisone (VOSOL-HC) OTIC solution [id number] No Place 3 drops into both ears 2 (two) times daily.  Patient not taking: Reported on 07/29/2022   Mardene Speak, PA-C Not Taking Active   ALPRAZolam Duanne Moron) 1 MG tablet [id number] Yes Take 1 tablet (1 mg total) by mouth 2 (two) times daily as needed. for anxiety Cloria Spring, MD Taking Active   amphetamine-dextroamphetamine (ADDERALL) 30 MG tablet [id number] Yes Take 1 tablet by mouth 2 (two) times daily. Cloria Spring, MD Taking Active   amphetamine-dextroamphetamine (ADDERALL) 30 MG tablet [id number]  Take 1 tablet by mouth 2 (two) times daily. Cloria Spring, MD  Active   aspirin EC 81 MG tablet [id number] Yes Take 81 mg by mouth daily. [provider] Taking Active Self  aspirin-acetaminophen-caffeine (EXCEDRIN MIGRAINE) [phone] MG tablet [id number] Yes Take 2 tablets by mouth every 6 (six) hours as needed for headache. [provider] Taking Active Self  atorvastatin (LIPITOR) 40 MG tablet [id number] Yes TAKE 1 TABLET BY MOUTH EVERY DAY Ostwalt, Janna, PA-C Taking Active   cetirizine (ZYRTEC) 10 MG tablet [id number] Yes TAKE 1 TABLET BY MOUTH DAILY Bacigalupo, Dionne Bucy, MD Taking Active   ciprofloxacin-dexamethasone [facility]) OTIC suspension [id number] No Place 4  drops into both ears 2 (two) times daily.  Patient not taking: Reported on 07/29/2022   Mardene Speak, PA-C Not Taking Active   colchicine 0.6 MG tablet [id number] Yes Take 1.$RemoveBe'2mg'NpBoOloZi$  (2 tablets) then 0.$RemoveBeforeD'6mg'VGmdNwfAxJAaIG$  (1 tablet) 1 hour after. Then,  take 1 tablet every day for 7 days. Criselda Peaches, DPM Taking Active            Med Note (Jane [name] A   Mon May 27, 2022  2:35 PM) Taking as needed  FLUoxetine (PROZAC) 20 MG capsule [id number] Yes Take 1 capsule (20 mg total) by mouth daily. Take with 40 mg to equal 60 mg daily Cloria Spring, MD Taking Active   FLUoxetine (PROZAC) 40 MG capsule [id number] Yes Take 1 capsule (40 mg total) by mouth daily. Take with 20 mg to equal 60 mg daily Cloria Spring, MD Taking Active   GARLIC PO [id number] Yes Take 1 tablet by mouth daily. [provider] Taking Active Self  hydrochlorothiazide (HYDRODIURIL) 25 MG tablet [id number] Yes TAKE 1 TABLET BY MOUTH DAILY Please schedule office visit BEFORE any future refills Myles Gip, DO Taking Active   HYDROcodone-acetaminophen (NORCO) 10-325 MG tablet [id number] Yes Take 1 tablet by mouth every 6 (six) hours as needed. To last 30 days from fill date Gillis Santa, MD Taking Active   HYDROcodone-acetaminophen (NORCO) 10-325 MG tablet [id number] Yes Take 1 tablet by mouth every 6 (six) hours as needed. To last 30 days from fill date Gillis Santa, MD Taking Active   HYDROcodone-acetaminophen (NORCO) 10-325 MG tablet [id number]  Take 1 tablet by mouth every 6 (six) hours as needed. To last 30 days from fill date Gillis Santa, MD  Active   levothyroxine (SYNTHROID) 125 MCG tablet [id number] Yes Take 125 mcg by mouth daily before breakfast. [provider] Taking Active Self  LINZESS 145 MCG CAPS capsule [id number] Yes TAKE ONE CAPSULE BY MOUTH DAILY Ostwalt, Janna, PA-C Taking Active   lisinopril (ZESTRIL) 5 MG tablet [id number] Yes Take 1 tablet (5 mg total) by mouth daily. [name], MD Taking Active   methocarbamol (ROBAXIN) 750 MG tablet [id number] Yes Take 1 tablet (750 mg total) by mouth 2 (two) times daily as needed for muscle spasms. Gillis Santa, MD Taking Active   Multiple Vitamin (MULTIVITAMIN) capsule [id number] Yes Take 1  capsule by mouth daily. [provider] Taking Active Self  Omega-3 Fatty Acids (OMEGA-3 FISH OIL PO) [id number] Yes Take 1 capsule by mouth daily. [provider] Taking Active Self  pantoprazole (PROTONIX) 40 MG tablet [id number] Yes TAKE 1 TABLET BY MOUTH DAILY Bacigalupo, Dionne Bucy, MD Taking Active Self  potassium chloride (KLOR-CON M) 10 MEQ tablet [id number] Yes TAKE 1 TABLET BY MOUTH THREE TIMES DAILY Myles Gip, DO Taking Active   pregabalin (LYRICA) 100 MG capsule [id number] Yes TAKE ONE CAPSULE BY MOUTH FOUR TIMES DAILY Brita Romp Dionne Bucy, MD Taking Active   rOPINIRole (REQUIP) 2 MG tablet [id number] Yes TAKE 1 TABLET BY MOUTH TWICE DAILY Brita Romp Dionne Bucy, MD Taking Active   Semaglutide, 1 MG/DOSE, 4 MG/3ML SOPN [id number] Yes Inject 1 mg as directed once a week. Laqueta Linden, MD Taking Active   traZODone (DESYREL) 50 MG tablet [id number] Yes Take 1 tablet (50 mg total) by mouth at bedtime. Cloria Spring, MD Taking Active   Vibegron [facility]) 75 MG TABS [id number] Yes Take 75 mg by mouth daily. Hollice Espy, MD Taking Active   warfarin (COUMADIN) 5 MG tablet [id number]  Yes TAKE 1 AND 1/2 TABLETS BY MOUTH ON TUEDAYS AND THURSDAYS, THEN TAKE 1 TABLET DAILY ON ALL OTHER DAYS  Patient taking differently: Take 5 mg by mouth daily.   Birdie Sons, MD Taking Active Self           Med Note Thamas Jaegers, Bettie Capistran A   Mon May 27, 2022  2:38 PM) [name]  Med List Note Landis Martins, RN 07/01/22 1157): Pain contract form and agreement signed 05/16/20 03/26/2022 UDS 07-01-22 Hydrocodone approved through 06-28-23            Patient Active Problem List   Diagnosis Date Noted   Chronic pain of both knees 10/22/2021   Hematuria 10/01/2021   Recurrent UTI 10/01/2021   Dyspnea on exertion 10/01/2021   Iron deficiency anemia 04/18/2021   Nocturnal sleep-related eating disorder 01/01/2021   Vitamin D deficiency  01/01/2021   Arthritis of left knee 12/26/2020   S/P left knee arthroscopy 11/14/20  11/21/2020   Old complex tear of medial meniscus of left knee    Primary osteoarthritis of left knee 11/09/2020   Subacromial bursitis of right shoulder joint 11/09/2020   Chronic pain syndrome 04/24/2020   Pharmacologic therapy 04/24/2020   Disorder of skeletal system 04/24/2020   Problems influencing health status 04/24/2020   History of thrombocytopenia 04/24/2020   Abnormal MRI, cervical spine (01/08/2017) 04/24/2020   Abnormal MRI, lumbar spine (05/11/2014) 04/24/2020   DDD (degenerative disc disease), cervical 04/24/2020   DDD (degenerative disc disease), lumbar 04/24/2020   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   Diabetic peripheral neuropathy ([place]) 04/24/2020   Neurogenic pain 04/24/2020   Chronic musculoskeletal pain 04/24/2020   Other proteinuria 03/30/2020   Myalgia 03/15/2020   Bilateral hand swelling 02/16/2020   Thrush 01/20/2020   Morbid obesity ([place]) 10/25/2019   History of back surgery 10/25/2019   Peripheral neuropathy 10/25/2019   Restless leg syndrome 10/25/2019   Chronic low back pain (Bilateral) w/ sciatica (Bilateral) 10/25/2019   Hypertension associated with diabetes ([place]) 10/25/2019   Type 2 diabetes mellitus with stage 3 chronic kidney disease, without long-term current use of insulin ([place]) 10/25/2019   Chronic anticoagulation (Coumadin) 10/25/2019   Hyperlipidemia associated with type 2 diabetes mellitus ([place]) 10/25/2019   CKD stage 3 due to type 2 diabetes mellitus ([place]) 10/25/2019   GERD (gastroesophageal reflux disease) 10/25/2019   Allergic rhinitis 10/25/2019   Chronic constipation 10/25/2019   Hypothyroidism    Normocytic anemia 09/15/2019   Iliac vein stenosis, 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   Chronic venous insufficiency 11/21/2017   Displacement of lumbar intervertebral disc without myelopathy 07/14/2017   Radicular low back pain  05/25/2014   Depression, major, recurrent ([place]) 06/17/2013   OCD (obsessive compulsive disorder) 11/20/2012   Insomnia due to mental disorder 09/25/2012   Bipolar 1 disorder ([place]) 09/25/2012   History of DVT (deep vein thrombosis) 08/18/2012    Conditions to be addressed/monitored per PCP order:  HLD and DVT  Care Plan : RN Care Manager Plan of Care  Updates made by Jane Montane, RN since 07/29/2022 12:00 AM     Problem: Health Management needs related to DVT and HLD      Long-Range Goal: Development of Plan of Care to address Health Management needs related to DVT and HLD   Start Date: 05/27/2022  Expected End Date: 10/03/2022  Priority: High  Note:   Current Barriers:  Chronic Disease Management  support and education needs related to HLD and DVT Financial Constraints.   RNCM Clinical Goal(s):  Patient will verbalize understanding of plan for management of HLD and DVT as evidenced by patient reports take all medications exactly as prescribed and will call provider for medication related questions as evidenced by documentation in EMR    attend all scheduled medical appointments: 08/05/22 with TFC, 08/19/22 with MWM, 09/12/22 with Pain Management as evidenced by provider documentation        through collaboration with RN Care manager, provider, and care team.   Interventions: Inter-disciplinary care team collaboration (see longitudinal plan of care) Evaluation of current treatment plan related to  self management and patient's adherence to plan as established by provider   DVT  (Status:  Goal on track:  Yes.)  Short Term Goal Evaluation of current treatment plan related to  DVT , self-management and patient's adherence to plan as established by provider. Discussed plans with patient for ongoing care management follow up and provided patient with direct contact information for care management team Provided education to patient re: DVT Reviewed medications with patient and  discussed the importance of taking as instructed Reviewed scheduled/upcoming provider appointments including: 08/05/22 with TFC, 08/19/22 with MWM, 09/12/22 with Pain Management Advised to wear compression stocking, elevate and exercise Advised to contact provider with concerns and questions  SDOH Barriers (Status:  Goal Met.) Long Term Goal Patient interviewed and SDOH assessment performed Collaborated with BSW for food pantries and financial resources, scheduled on 05/30/22 @ 9am        SDOH Interventions    [place] Office Visit from 06/11/2022 in [place] Patient Outreach Telephone from 05/27/2022 in [place] Visit from 10/25/2020 in [place] Office Visit from 10/25/2019 in [place] Interventions -- Other (Comment)  Jane [name] Guide Referral for food resources] -- --  Housing Interventions -- Intervention Not Indicated -- --  Transportation Interventions -- Intervention Not Indicated Intervention Not Indicated --  Depression Interventions/Treatment  Counseling, Currently on Treatment -- -- Currently on Treatment      Hyperlipidemia:  (Status: New goal.) Long Term Goal  Lab Results  Component Value Date   CHOL 175 04/22/2022   HDL 38 (L) 04/22/2022   LDLCALC 95 04/22/2022   TRIG 248 (H) 04/22/2022   CHOLHDL 4.6 (H) 04/22/2022     Medication review performed; medication list updated in electronic medical record.  Provider established cholesterol goals reviewed; Counseled on importance of regular laboratory monitoring as prescribed; Provided HLD educational materials; Reviewed importance of limiting foods high in cholesterol; Reviewed exercise goals and target of 150 minutes per week;  Patient Goals/Self-Care Activities: Take medications as prescribed   Attend all scheduled provider appointments Call provider office for new concerns or  questions  - call for medicine refill 2 or 3 days before it runs out - take all medications exactly as prescribed - call doctor when you experience any new symptoms       Follow Up:  Patient agrees to Care Plan and Follow-up.  Plan: The Managed Medicaid care management team will reach out to the patient again over the next 60 days.  Date/time of next scheduled RN care management/care coordination outreach:  09/30/22 @ [place] RN, [place] RN Care Coordinator

## 2022-07-29 NOTE — Patient Instructions (Signed)
Visit Information  Jane [name] was given information about Medicaid Managed Care team care coordination services as a part of their Healthy [facility] Medicaid benefit. Jane [name] verbally consented to engagement with the [facility] Managed Care team.   If you are experiencing a medical emergency, please call 911 or report to your local emergency department or urgent care.   If you have a non-emergency medical problem during routine business hours, please contact your provider's office and ask to speak with a nurse.   For questions related to your Healthy [facility] health plan, please call: [phone] or visit the homepage here: [email or web address]  If you would like to schedule transportation through your Healthy [facility] plan, please call the following number at least 2 days in advance of your appointment: [phone]  For information about your ride after you set it up, call Ride Assist at [phone]. Use this number to activate a Will Call pickup, or if your transportation is late for a scheduled pickup. Use this number, too, if you need to make a change or cancel a previously scheduled reservation.  If you need transportation services right away, call [phone]. The after-hours call center is staffed 24 hours to handle ride assistance and urgent reservation requests (including discharges) 365 days a year. Urgent trips include sick visits, hospital discharge requests and life-sustaining treatment.  Call the [place] at [phone], at any time, 24 hours a day, 7 days a week. If you are in danger or need immediate medical attention call 911.  If you would like help to quit smoking, call 1-800-QUIT-NOW 417-792-2415) OR Espaol: 1-855-Djelo-Ya (3-729-021-1155) o para ms informacin haga clic aqu or Text READY to 200-400 to register via text  Jane [name],   Please see education materials related to Cholesterol  provided by MyChart link.  Patient verbalizes understanding of instructions and care plan provided today and agrees to view in [place]. Active MyChart status and patient understanding of how to access instructions and care plan via MyChart confirmed with patient.     Telephone follow up appointment with Managed Medicaid care management team member scheduled for:09/30/22 @ [place] RN, [place] RN Care Coordinator   Following is a copy of your plan of care:  Care Plan : RN Care Manager Plan of Care  Updates made by Melissa Montane, RN since 07/29/2022 12:00 AM     Problem: Health Management needs related to DVT and HLD      Long-Range Goal: Development of Plan of Care to address Health Management needs related to DVT and HLD   Start Date: 05/27/2022  Expected End Date: 10/03/2022  Priority: High  Note:   Current Barriers:  Chronic Disease Management support and education needs related to HLD and DVT Financial Constraints.   RNCM Clinical Goal(s):  Patient will verbalize understanding of plan for management of HLD and DVT as evidenced by patient reports take all medications exactly as prescribed and will call provider for medication related questions as evidenced by documentation in EMR    attend all scheduled medical appointments: 08/05/22 with TFC, 08/19/22 with MWM, 09/12/22 with Pain Management as evidenced by provider documentation        through collaboration with RN Care manager, provider, and care team.   Interventions: Inter-disciplinary care team collaboration (see longitudinal plan of care) Evaluation of current treatment plan related to  self management and patient's adherence to plan as established by provider   DVT  (Status:  Goal on track:  Yes.)  Short Term Goal Evaluation of current treatment plan related to  DVT , self-management and patient's adherence to plan as established by provider. Discussed plans with patient for  ongoing care management follow up and provided patient with direct contact information for care management team Provided education to patient re: DVT Reviewed medications with patient and discussed the importance of taking as instructed Reviewed scheduled/upcoming provider appointments including: 08/05/22 with TFC, 08/19/22 with MWM, 09/12/22 with Pain Management Advised to wear compression stocking, elevate and exercise Advised to contact provider with concerns and questions  SDOH Barriers (Status:  Goal Met.) Long Term Goal Patient interviewed and SDOH assessment performed Collaborated with BSW for food pantries and financial resources, scheduled on 05/30/22 @ 9am        SDOH Interventions    [place] Office Visit from 06/11/2022 in [place] Patient Outreach Telephone from 05/27/2022 in [place] Visit from 10/25/2020 in [place] Office Visit from 10/25/2019 in [place] Interventions -- Other (Comment)  [name] Guide Referral for food resources] -- --  Housing Interventions -- Intervention Not Indicated -- --  Transportation Interventions -- Intervention Not Indicated Intervention Not Indicated --  Depression Interventions/Treatment  Counseling, Currently on Treatment -- -- Currently on Treatment      Hyperlipidemia:  (Status: New goal.) Long Term Goal  Lab Results  Component Value Date   CHOL 175 04/22/2022   HDL 38 (L) 04/22/2022   LDLCALC 95 04/22/2022   TRIG 248 (H) 04/22/2022   CHOLHDL 4.6 (H) 04/22/2022     Medication review performed; medication list updated in electronic medical record.  Provider established cholesterol goals reviewed; Counseled on importance of regular laboratory monitoring as prescribed; Provided HLD educational materials; Reviewed importance of limiting foods high in cholesterol; Reviewed exercise goals and  target of 150 minutes per week;  Patient Goals/Self-Care Activities: Take medications as prescribed   Attend all scheduled provider appointments Call provider office for new concerns or questions  - call for medicine refill 2 or 3 days before it runs out - take all medications exactly as prescribed - call doctor when you experience any new symptoms

## 2022-08-04 ENCOUNTER — Encounter (HOSPITAL_COMMUNITY): Payer: Self-pay | Admitting: Hematology

## 2022-08-05 ENCOUNTER — Ambulatory Visit: Payer: Medicaid Other | Admitting: Podiatry

## 2022-08-05 ENCOUNTER — Encounter (HOSPITAL_COMMUNITY): Payer: Self-pay | Admitting: Hematology

## 2022-08-05 DIAGNOSIS — M1A3720 Chronic gout due to renal impairment, left ankle and foot, without tophus (tophi): Secondary | ICD-10-CM | POA: Diagnosis not present

## 2022-08-05 DIAGNOSIS — M216X2 Other acquired deformities of left foot: Secondary | ICD-10-CM | POA: Diagnosis not present

## 2022-08-05 DIAGNOSIS — G6289 Other specified polyneuropathies: Secondary | ICD-10-CM | POA: Diagnosis not present

## 2022-08-05 DIAGNOSIS — M67874 Other specified disorders of tendon, left ankle and foot: Secondary | ICD-10-CM

## 2022-08-05 NOTE — Patient Instructions (Addendum)
Look for Voltaren gel at the pharmacy over the counter or online (also known as diclofenac 1% gel). Apply to the painful areas 3-4x daily with the supplied dosing card. Allow to dry for 10 minutes before going into socks/shoes   Achilles Tendinitis  with Rehab Achilles tendinitis is a disorder of the Achilles tendon. The Achilles tendon connects the large calf muscles (Gastrocnemius and Soleus) to the heel bone (calcaneus). This tendon is sometimes called the heel cord. It is important for pushing-off and standing on your toes and is important for walking, running, or jumping. Tendinitis is often caused by overuse and repetitive microtrauma. SYMPTOMS Pain, tenderness, swelling, warmth, and redness may occur over the Achilles tendon even at rest. Pain with pushing off, or flexing or extending the ankle. Pain that is worsened after or during activity. CAUSES  Overuse sometimes seen with rapid increase in exercise programs or in sports requiring running and jumping. Poor physical conditioning (strength and flexibility or endurance). Running sports, especially training running down hills. Inadequate warm-up before practice or play or failure to stretch before participation. Injury to the tendon. PREVENTION  Warm up and stretch before practice or competition. Allow time for adequate rest and recovery between practices and competition. Keep up conditioning. Keep up ankle and leg flexibility. Improve or keep muscle strength and endurance. Improve cardiovascular fitness. Use proper technique. Use proper equipment (shoes, skates). To help prevent recurrence, taping, protective strapping, or an adhesive bandage may be recommended for several weeks after healing is complete. PROGNOSIS  Recovery may take weeks to several months to heal. Longer recovery is expected if symptoms have been prolonged. Recovery is usually quicker if the inflammation is due to a direct blow as compared with overuse or  sudden strain. RELATED COMPLICATIONS  Healing time will be prolonged if the condition is not correctly treated. The injury must be given plenty of time to heal. Symptoms can reoccur if activity is resumed too soon. Untreated, tendinitis may increase the risk of tendon rupture requiring additional time for recovery and possibly surgery. TREATMENT  The first treatment consists of rest anti-inflammatory medication, and ice to relieve the pain. Stretching and strengthening exercises after resolution of pain will likely help reduce the risk of recurrence. Referral to a physical therapist or athletic trainer for further evaluation and treatment may be helpful. A walking boot or cast may be recommended to rest the Achilles tendon. This can help break the cycle of inflammation and microtrauma. Arch supports (orthotics) may be prescribed or recommended by your caregiver as an adjunct to therapy and rest. Surgery to remove the inflamed tendon lining or degenerated tendon tissue is rarely necessary and has shown less than predictable results. MEDICATION  Nonsteroidal anti-inflammatory medications, such as aspirin and ibuprofen, may be used for pain and inflammation relief. Do not take within 7 days before surgery. Take these as directed by your caregiver. Contact your caregiver immediately if any bleeding, stomach upset, or signs of allergic reaction occur. Other minor pain relievers, such as acetaminophen, may also be used. Pain relievers may be prescribed as necessary by your caregiver. Do not take prescription pain medication for longer than 4 to 7 days. Use only as directed and only as much as you need. Cortisone injections are rarely indicated. Cortisone injections may weaken tendons and predispose to rupture. It is better to give the condition more time to heal than to use them. HEAT AND COLD Cold is used to relieve pain and reduce inflammation for acute and chronic Achilles   tendinitis. Cold should be  applied for 10 to 15 minutes every 2 to 3 hours for inflammation and pain and immediately after any activity that aggravates your symptoms. Use ice packs or an ice massage. Heat may be used before performing stretching and strengthening activities prescribed by your caregiver. Use a heat pack or a warm soak. SEEK MEDICAL CARE IF: Symptoms get worse or do not improve in 2 weeks despite treatment. New, unexplained symptoms develop. Drugs used in treatment may produce side effects.  EXERCISES:  RANGE OF MOTION (ROM) AND STRETCHING EXERCISES - Achilles Tendinitis  These exercises may help you when beginning to rehabilitate your injury. Your symptoms may resolve with or without further involvement from your physician, physical therapist or athletic trainer. While completing these exercises, remember:  Restoring tissue flexibility helps normal motion to return to the joints. This allows healthier, less painful movement and activity. An effective stretch should be held for at least 30 seconds. A stretch should never be painful. You should only feel a gentle lengthening or release in the stretched tissue.  STRETCH  Gastroc, Standing  Place hands on wall. Extend right / left leg, keeping the front knee somewhat bent. Slightly point your toes inward on your back foot. Keeping your right / left heel on the floor and your knee straight, shift your weight toward the wall, not allowing your back to arch. You should feel a gentle stretch in the right / left calf. Hold this position for 10 seconds. Repeat 3 times. Complete this stretch 2 times per day.  STRETCH  Soleus, Standing  Place hands on wall. Extend right / left leg, keeping the other knee somewhat bent. Slightly point your toes inward on your back foot. Keep your right / left heel on the floor, bend your back knee, and slightly shift your weight over the back leg so that you feel a gentle stretch deep in your back calf. Hold this position for 10  seconds. Repeat 3 times. Complete this stretch 2 times per day.  STRETCH  Gastrocsoleus, Standing  Note: This exercise can place a lot of stress on your foot and ankle. Please complete this exercise only if specifically instructed by your caregiver.  Place the ball of your right / left foot on a step, keeping your other foot firmly on the same step. Hold on to the wall or a rail for balance. Slowly lift your other foot, allowing your body weight to press your heel down over the edge of the step. You should feel a stretch in your right / left calf. Hold this position for 10 seconds. Repeat this exercise with a slight bend in your knee. Repeat 3 times. Complete this stretch 2 times per day.   STRENGTHENING EXERCISES - Achilles Tendinitis These exercises may help you when beginning to rehabilitate your injury. They may resolve your symptoms with or without further involvement from your physician, physical therapist or athletic trainer. While completing these exercises, remember:  Muscles can gain both the endurance and the strength needed for everyday activities through controlled exercises. Complete these exercises as instructed by your physician, physical therapist or athletic trainer. Progress the resistance and repetitions only as guided. You may experience muscle soreness or fatigue, but the pain or discomfort you are trying to eliminate should never worsen during these exercises. If this pain does worsen, stop and make certain you are following the directions exactly. If the pain is still present after adjustments, discontinue the exercise until you can discuss the   trouble with your clinician.  STRENGTH - Plantar-flexors  Sit with your right / left leg extended. Holding onto both ends of a rubber exercise band/tubing, loop it around the ball of your foot. Keep a slight tension in the band. Slowly push your toes away from you, pointing them downward. Hold this position for 10 seconds. Return  slowly, controlling the tension in the band/tubing. Repeat 3 times. Complete this exercise 2 times per day.   STRENGTH - Plantar-flexors  Stand with your feet shoulder width apart. Steady yourself with a wall or table using as little support as needed. Keeping your weight evenly spread over the width of your feet, rise up on your toes.* Hold this position for 10 seconds. Repeat 3 times. Complete this exercise 2 times per day.  *If this is too easy, shift your weight toward your right / left leg until you feel challenged. Ultimately, you may be asked to do this exercise with your right / left foot only.  STRENGTH  Plantar-flexors, Eccentric  Note: This exercise can place a lot of stress on your foot and ankle. Please complete this exercise only if specifically instructed by your caregiver.  Place the balls of your feet on a step. With your hands, use only enough support from a wall or rail to keep your balance. Keep your knees straight and rise up on your toes. Slowly shift your weight entirely to your right / left toes and pick up your opposite foot. Gently and with controlled movement, lower your weight through your right / left foot so that your heel drops below the level of the step. You will feel a slight stretch in the back of your calf at the end position. Use the healthy leg to help rise up onto the balls of both feet, then lower weight only on the right / left leg again. Build up to 15 repetitions. Then progress to 3 consecutive sets of 15 repetitions.* After completing the above exercise, complete the same exercise with a slight knee bend (about 30 degrees). Again, build up to 15 repetitions. Then progress to 3 consecutive sets of 15 repetitions.* Perform this exercise 2 times per day.  *When you easily complete 3 sets of 15, your physician, physical therapist or athletic trainer may advise you to add resistance by wearing a backpack filled with additional weight.  STRENGTH - Plantar  Flexors, Seated  Sit on a chair that allows your feet to rest flat on the ground. If necessary, sit at the edge of the chair. Keeping your toes firmly on the ground, lift your right / left heel as far as you can without increasing any discomfort in your ankle. Repeat 3 times. Complete this exercise 2 times a day.  

## 2022-08-06 ENCOUNTER — Encounter: Payer: Self-pay | Admitting: Family Medicine

## 2022-08-06 ENCOUNTER — Ambulatory Visit: Payer: Medicaid Other | Admitting: Family Medicine

## 2022-08-06 ENCOUNTER — Encounter (HOSPITAL_COMMUNITY): Payer: Self-pay | Admitting: Hematology

## 2022-08-06 VITALS — BP 100/65 | HR 64 | Temp 98.0°F | Resp 16 | Wt 244.0 lb

## 2022-08-06 DIAGNOSIS — R5383 Other fatigue: Secondary | ICD-10-CM | POA: Diagnosis not present

## 2022-08-06 DIAGNOSIS — L237 Allergic contact dermatitis due to plants, except food: Secondary | ICD-10-CM

## 2022-08-06 DIAGNOSIS — F5105 Insomnia due to other mental disorder: Secondary | ICD-10-CM | POA: Diagnosis not present

## 2022-08-06 DIAGNOSIS — N183 Chronic kidney disease, stage 3 unspecified: Secondary | ICD-10-CM

## 2022-08-06 DIAGNOSIS — E1122 Type 2 diabetes mellitus with diabetic chronic kidney disease: Secondary | ICD-10-CM | POA: Diagnosis not present

## 2022-08-06 DIAGNOSIS — K5909 Other constipation: Secondary | ICD-10-CM | POA: Diagnosis not present

## 2022-08-06 MED ORDER — TRAZODONE HCL 100 MG PO TABS: 100.0000 mg | ORAL_TABLET | Freq: Every day | ORAL | 0 refills | Status: DC

## 2022-08-06 MED ORDER — POLYETHYLENE GLYCOL 3350 17 GM/SCOOP PO POWD: 17.0000 g | Freq: Every day | ORAL | 1 refills | Status: DC

## 2022-08-06 MED ORDER — TRIAMCINOLONE ACETONIDE 0.025 % EX OINT: 1.0000 | TOPICAL_OINTMENT | Freq: Two times a day (BID) | CUTANEOUS | 0 refills | Status: DC

## 2022-08-06 NOTE — Patient Instructions (Signed)
It was great to see you!  Our plans for today:  - Increase your trazodone to '100mg'$  nightly. - Take miralax daily and titrate for effect (after a couple days, if no smooth bowel movement increase to two capfuls per day and so on) - Someone will call you with an appointment for a sleep study.   We are checking some labs today, we will release these results to your MyChart.  Take care and seek immediate care sooner if you develop any concerns.   Dr. Ky Barban

## 2022-08-06 NOTE — Progress Notes (Unsigned)
   SUBJECTIVE:   CHIEF COMPLAINT / HPI:   FATIGUE - h/o RLS, neurogenic pain, bipolar d/o - f/b behavioral health in [place] Duration:  chronic, worse in recent weeks Symptoms improve with rest: no  Depressive symptoms: h/o Bipolar d/p, managed by psych Insomnia: yes hard to fall and stay asleep  Snoring: h/o OSA, not on CPAP, insurance would not pay, requiring additional sleep study Wakes feeling refreshed: no History of sleep study: yes Dysnea on exertion:  no Orthopnea/PND: no Chest pain: no Chronic cough: no RLS: yes Treatments attempted: trazodone, amitriptyline (recently discontinued by psych, melatonin  ABDOMINAL ISSUES - h/o chronic constipation, on linzess - on norco for chronic pain  Duration: chronic Nature: cramping, irritation Location: LLQ  Radiation: no Frequency: constant Alleviating factors:none Aggravating factors: none Treatments attempted: milk of magnesia, linzess, increasing fluids Constipation: yes. Having BM daily but becoming harder to pass, lumpy and very hard in character. Last BM this morning. Diarrhea: some Heartburn: no Bloating:yes Nausea: yes Vomiting: no Melena or hematochezia: no Rash: no Jaundice: no Fever: no Weight loss: no  RASH - did some yard work thurs/fri Duration:  since Sunday  Location: R arm > chest, L arm  Itching: yes Burning: yes Redness: yes Oozing: yes Scaling: no Blisters: yes Painful: yes Fevers: no Change in detergents/soaps/personal care products: no Recent illness: no Recent travel:no History of same: yes Context: worse Alleviating factors: none Treatments attempted:calamine lotion   OBJECTIVE:   BP 100/65 (BP Location: Left Arm, Patient Position: Sitting, Cuff Size: Large)   Pulse 64   Temp 98 F (36.7 C) (Oral)   Resp 16   Wt 244 lb (110.7 kg)   LMP  (LMP Unknown)   SpO2 96%   BMI 35.01 kg/m   Gen: well appearing, in NAD Card: RRR Lungs: CTAB Abd: soft, slightly TTP in LLQ. No  rebound tenderness or guarding. Hypoactive BS.  Ext: WWP, no edema Skin: scattered diffuse blisters with erythematous base in linear distribution to R forearm, R upper chest, one dried lesion to L forearm.    ASSESSMENT/PLAN:   Chronic constipation Chronic, ongoing. Complicated by chronic opioid use. Exam benign today and without red flag symptoms. OMT mesenteric release performed today with some relief in abdominal discomfort. Not currently on fiber supplement, instructed to start miralax and titrate for effect. Has f/u scheduled next month. If still without relief, consider adding bowel stimulant given chronic opioid use.  Insomnia due to mental disorder Insomnia and fatigue complicated by bipolar d/o, likely untreated sleep apnea, RLS. Has good sleep hygiene. Will obtain updated sleep study. Recommend increasing trazodone. Obtain labs to look for contributors. Continue to follow with Psych.    Contact Dermatitis Likely 2/2 plant, poison ivy vs oak. No signs of superimposed infection. Rx topical steroid.   Myles Gip, DO

## 2022-08-07 LAB — COMPREHENSIVE METABOLIC PANEL
ALT: 14 IU/L (ref 0–32)
AST: 14 IU/L (ref 0–40)
Albumin/Globulin Ratio: 2 (ref 1.2–2.2)
Albumin: 4.8 g/dL (ref 3.8–4.9)
Alkaline Phosphatase: 101 IU/L (ref 44–121)
BUN/Creatinine Ratio: 20 (ref 9–23)
BUN: 27 mg/dL — ABNORMAL HIGH (ref 6–24)
Bilirubin Total: 0.3 mg/dL (ref 0.0–1.2)
CO2: 28 mmol/L (ref 20–29)
Calcium: 10.3 mg/dL — ABNORMAL HIGH (ref 8.7–10.2)
Chloride: 100 mmol/L (ref 96–106)
Creatinine, Ser: 1.33 mg/dL — ABNORMAL HIGH (ref 0.57–1.00)
Globulin, Total: 2.4 g/dL (ref 1.5–4.5)
Glucose: 96 mg/dL (ref 70–99)
Potassium: 4.5 mmol/L (ref 3.5–5.2)
Sodium: 141 mmol/L (ref 134–144)
Total Protein: 7.2 g/dL (ref 6.0–8.5)
eGFR: 47 mL/min/{1.73_m2} — ABNORMAL LOW (ref >59)

## 2022-08-07 LAB — CBC WITH DIFFERENTIAL/PLATELET
Basophils Absolute: 0 10*3/uL (ref 0.0–0.2)
Basos: 1 %
EOS (ABSOLUTE): 0.1 10*3/uL (ref 0.0–0.4)
Eos: 3 %
Hematocrit: 39.5 % (ref 34.0–46.6)
Hemoglobin: 13 g/dL (ref 11.1–15.9)
Immature Grans (Abs): 0 10*3/uL (ref 0.0–0.1)
Immature Granulocytes: 0 %
Lymphocytes Absolute: 1.8 10*3/uL (ref 0.7–3.1)
Lymphs: 34 %
MCH: 29.7 pg (ref 26.6–33.0)
MCHC: 32.9 g/dL (ref 31.5–35.7)
MCV: 90 fL (ref 79–97)
Monocytes Absolute: 0.5 10*3/uL (ref 0.1–0.9)
Monocytes: 9 %
Neutrophils Absolute: 2.9 10*3/uL (ref 1.4–7.0)
Neutrophils: 53 %
Platelets: 125 10*3/uL — ABNORMAL LOW (ref 150–450)
RBC: 4.38 x10E6/uL (ref 3.77–5.28)
RDW: 13.1 % (ref 11.7–15.4)
WBC: 5.3 10*3/uL (ref 3.4–10.8)

## 2022-08-07 LAB — MAGNESIUM: Magnesium: 2.1 mg/dL (ref 1.6–2.3)

## 2022-08-07 LAB — VITAMIN B12: Vitamin B-12: 709 pg/mL (ref 232–1245)

## 2022-08-07 LAB — TSH: TSH: 1.42 u[IU]/mL (ref 0.450–4.500)

## 2022-08-07 NOTE — Assessment & Plan Note (Signed)
Insomnia and fatigue complicated by bipolar d/o, likely untreated sleep apnea, RLS. Has good sleep hygiene. Will obtain updated sleep study. Recommend increasing trazodone. Obtain labs to look for contributors. Continue to follow with Psych.

## 2022-08-07 NOTE — Assessment & Plan Note (Signed)
Chronic, ongoing. Complicated by chronic opioid use. Exam benign today and without red flag symptoms. OMT mesenteric release performed today with some relief in abdominal discomfort. Not currently on fiber supplement, instructed to start miralax and titrate for effect. Has f/u scheduled next month. If still without relief, consider adding bowel stimulant given chronic opioid use.

## 2022-08-08 NOTE — Progress Notes (Signed)
  Subjective:  Patient ID: Jane [name], female    DOB: May 03, 1966,  MRN: [id number]  Chief Complaint  Patient presents with   Foot Pain    "They're hurting, the heel is still swollen."    56 y.o. female presents with the above complaint. History confirmed with patient.  She has numbness in the left foot especially in the toes can be painful and the nails in the tips of the toes.  Feet always hurt all over physical cramping in her arches.  Also has swelling and tenderness at the insertion of her Achilles.  She previously has had ingrown toenails does not think that they were properly resolved.  Never evaluated for gout.  She has an upcoming venogram with Dr. Donzetta Matters.  She does have a history of spinal pathology as well  Interval history: Still feels about the same, the toes are very tender as well  Objective:  Physical Exam: warm, good capillary refill, no trophic changes or ulcerative lesions, normal DP and PT pulses, and abnormal sensory exam with loss of protective sensation in the toes and pain at the insertion of the Achilles.  Semireducible hammertoe deformities  Assessment:   1. Achilles tendinosis of left ankle   2. 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 Other polyneuropathy      Plan:  Patient was evaluated and treated and all questions answered.  Discussed with her that she should continue therapy.  She went to 1 session at [facility] and did not follow-up further with them.  Most of the studies expect is chronic pain and there is likely no full or reasonable solution to this at this point.  Dispensed her silicone toe crest to offload the tips of the toes which are tender for her.  She will return as needed.  I do not know if there is much else that I can offer her for further treatment at this point  Return if symptoms worsen or fail to improve.

## 2022-08-12 ENCOUNTER — Ambulatory Visit: Payer: Self-pay | Admitting: *Deleted

## 2022-08-12 ENCOUNTER — Encounter (HOSPITAL_COMMUNITY): Payer: Self-pay | Admitting: Hematology

## 2022-08-12 NOTE — Telephone Encounter (Signed)
Patient advised as below. She has scheduled appt with Dermatology for 08/14/22. Patient requested to cancel appt 08/15/22 here for rash.

## 2022-08-12 NOTE — Telephone Encounter (Signed)
  Chief Complaint: spreading rash- ongoing rash Symptoms: red, blisters, itching and pain Frequency: ongoing since 10/3- getting worse Pertinent Negatives: Patient denies dizziness, headache, sore throat,new joint pain Disposition: '[]'$ ED /'[]'$ Urgent Care (no appt availability in office) / '[]'$ Appointment(In office/virtual)/ '[]'$  [facility]/ '[]'$ Home Care/ '[x]'$ Refused Recommended Disposition /'[]'$ [facility]/ '[]'$  Follow-up with PCP Additional Notes: Does not want to go back to UC/ED- wants to be seen at office- by MD- advised will send call for review.

## 2022-08-12 NOTE — Telephone Encounter (Signed)
Summary: rash   Pt was seen in office on 10.3.23 for a rash on her right arm/ she stated they thought it was poison ivy but since then has gotten worse and she has been to the ER twice for the rash / the ER thinks it is viral / pt can not get an appt with dermatologist until month out / pt scheduled an appt for this Thursday (only wants to see a MD) / please advise     Reason for Disposition  Large or small blisters on skin (i.e., fluid filled bubbles or sacs)  Answer Assessment - Initial Assessment Questions 1. APPEARANCE of RASH: "Describe the rash." (e.g., spots, blisters, raised areas, skin peeling, scaly)     Gotten worse- is spreading- blistered 2. SIZE: "How big are the spots?" (e.g., tip of pen, eraser, coin; inches, centimeters)     Red irritation large blisters 3. LOCATION: "Where is the rash located?"     R arm, part of L arm, R leg, part of L leg 4. COLOR: "What color is the rash?" (Note: It is difficult to assess rash color in people with darker-colored skin. When this situation occurs, simply ask the caller to describe what they see.)     red 5. ONSET: "When did the rash begin?"     ongoing 6. FEVER: "Do you have a fever?" If Yes, ask: "What is your temperature, how was it measured, and when did it start?"     no 7. ITCHING: "Does the rash itch?" If Yes, ask: "How bad is the itch?" (Scale 1-10; or mild, moderate, severe)     Sore- inside itching 8. CAUSE: "What do you think is causing the rash?"     unknown 9. MEDICINE FACTORS: "Have you started any new medicines within the last 2 weeks?" (e.g., antibiotics)      no 10. OTHER SYMPTOMS: "Do you have any other symptoms?" (e.g., dizziness, headache, sore throat, joint pain)       no 11. PREGNANCY: "Is there any chance you are pregnant?" "When was your last menstrual period?"  Protocols used: Rash or Redness - [facility]

## 2022-08-12 NOTE — Telephone Encounter (Signed)
Can we see if anyone has any acute openings today or tomorrow?

## 2022-08-14 ENCOUNTER — Ambulatory Visit: Payer: Medicaid Other | Admitting: Dermatology

## 2022-08-15 ENCOUNTER — Ambulatory Visit: Payer: Medicaid Other | Admitting: Family Medicine

## 2022-08-19 ENCOUNTER — Encounter (INDEPENDENT_AMBULATORY_CARE_PROVIDER_SITE_OTHER): Payer: Self-pay | Admitting: Family Medicine

## 2022-08-19 ENCOUNTER — Ambulatory Visit: Payer: Medicaid Other | Admitting: Dermatology

## 2022-08-19 ENCOUNTER — Ambulatory Visit (INDEPENDENT_AMBULATORY_CARE_PROVIDER_SITE_OTHER): Payer: Medicaid Other | Admitting: Family Medicine

## 2022-08-19 VITALS — BP 110/52 | HR 72 | Temp 97.9°F | Ht 70.0 in | Wt 238.0 lb

## 2022-08-19 DIAGNOSIS — E785 Hyperlipidemia, unspecified: Secondary | ICD-10-CM | POA: Diagnosis not present

## 2022-08-19 DIAGNOSIS — E1169 Type 2 diabetes mellitus with other specified complication: Secondary | ICD-10-CM

## 2022-08-19 DIAGNOSIS — E1165 Type 2 diabetes mellitus with hyperglycemia: Secondary | ICD-10-CM | POA: Diagnosis not present

## 2022-08-19 DIAGNOSIS — Z6834 Body mass index (BMI) 34.0-34.9, adult: Secondary | ICD-10-CM

## 2022-08-19 DIAGNOSIS — B009 Herpesviral infection, unspecified: Secondary | ICD-10-CM | POA: Diagnosis not present

## 2022-08-19 DIAGNOSIS — Z6841 Body Mass Index (BMI) 40.0 and over, adult: Secondary | ICD-10-CM

## 2022-08-19 DIAGNOSIS — Z7985 Long-term (current) use of injectable non-insulin antidiabetic drugs: Secondary | ICD-10-CM

## 2022-08-19 DIAGNOSIS — L237 Allergic contact dermatitis due to plants, except food: Secondary | ICD-10-CM | POA: Diagnosis not present

## 2022-08-19 DIAGNOSIS — E669 Obesity, unspecified: Secondary | ICD-10-CM | POA: Diagnosis not present

## 2022-08-19 MED ORDER — VALACYCLOVIR HCL 1 G PO TABS: ORAL_TABLET | ORAL | 3 refills | Status: DC

## 2022-08-19 MED ORDER — CLOBETASOL PROPIONATE 0.05 % EX CREA: TOPICAL_CREAM | CUTANEOUS | 1 refills | Status: DC

## 2022-08-19 MED ORDER — SEMAGLUTIDE (1 MG/DOSE) 4 MG/3ML ~~LOC~~ SOPN: 1.0000 mg | PEN_INJECTOR | SUBCUTANEOUS | 0 refills | Status: DC

## 2022-08-19 NOTE — Patient Instructions (Addendum)
Start clobetasol cream - apply to affected areas rash twice a day until rash improved. Avoid face, groin, axilla.   Topical steroids (such as triamcinolone, fluocinolone, fluocinonide, mometasone, clobetasol, halobetasol, betamethasone, hydrocortisone) can cause thinning and lightening of the skin if they are used for too long in the same area. Your physician has selected the right strength medicine for your problem and area affected on the body. Please use your medication only as directed by your physician to prevent side effects.   Recommend OTC Gold Bond Rapid Relief Anti-Itch cream (pramoxine + menthol), CeraVe Anti-itch cream or lotion (pramoxine), Sarna lotion (Original- menthol + camphor or Sensitive- pramoxine) or Eucerin 12 hour Itch Relief lotion (menthol) up to 3 times per day to areas on body that are itchy.  Start Valtrex '1000mg'$  take 2 tablets by mouth at first onset of symptoms of fever blister, then 2 tablets by mouth 12 hours later.   Due to recent changes in healthcare laws, you may see results of your pathology and/or laboratory studies on MyChart before the doctors have had a chance to review them. We understand that in some cases there may be results that are confusing or concerning to you. Please understand that not all results are received at the same time and often the doctors may need to interpret multiple results in order to provide you with the best plan of care or course of treatment. Therefore, we ask that you please give [place] 2 business days to thoroughly review all your results before contacting the office for clarification. Should we see a critical lab result, you will be contacted sooner.   If You Need Anything After Your Visit  If you have any questions or concerns for your doctor, please call our main line at [phone] and press option 4 to reach your doctor's medical assistant. If no one answers, please leave a voicemail as directed and we will return your call as  soon as possible. Messages left after 4 pm will be answered the following business day.   You may also send [place] a message via [place]. We typically respond to MyChart messages within 1-2 business days.  For prescription refills, please ask your pharmacy to contact our office. Our fax number is [phone].  If you have an urgent issue when the clinic is closed that cannot wait until the next business day, you can page your doctor at the number below.    Please note that while we do our best to be available for urgent issues outside of office hours, we are not available 24/7.   If you have an urgent issue and are unable to reach [place], you may choose to seek medical care at your doctor's office, retail clinic, urgent care center, or emergency room.  If you have a medical emergency, please immediately call 911 or go to the emergency department.  Pager Numbers  - Dr. Nehemiah Massed: [phone]  - Dr. Laurence Ferrari: [phone]  - Dr. Nicole Kindred: [phone]  In the event of inclement weather, please call our main line at [phone] for an update on the status of any delays or closures.  Dermatology Medication Tips: Please keep the boxes that topical medications come in in order to help keep track of the instructions about where and how to use these. Pharmacies typically print the medication instructions only on the boxes and not directly on the medication tubes.   If your medication is too expensive, please contact our office at [phone] option 4 or send [place] a message  through [place].   We are unable to tell what your co-pay for medications will be in advance as this is different depending on your insurance coverage. However, we may be able to find a substitute medication at lower cost or fill out paperwork to get insurance to cover a needed medication.   If a prior authorization is required to get your medication covered by your insurance company, please allow [place] 1-2 business days to complete this  process.  Drug prices often vary depending on where the prescription is filled and some pharmacies may offer cheaper prices.  The website www.goodrx.com contains coupons for medications through different pharmacies. The prices here do not account for what the cost may be with help from insurance (it may be cheaper with your insurance), but the website can give you the price if you did not use any insurance.  - You can print the associated coupon and take it with your prescription to the pharmacy.  - You may also stop by our office during regular business hours and pick up a GoodRx coupon card.  - If you need your prescription sent electronically to a different pharmacy, notify our office through [facility] or by phone at [phone] option 4.     Si Usted Necesita Algo Despus de Su Visita  Tambin puede enviarnos un mensaje a travs de Pharmacist, community. Por lo general respondemos a los mensajes de MyChart en el transcurso de 1 a 2 das hbiles.  Para renovar recetas, por favor pida a su farmacia que se ponga en contacto con nuestra oficina. Harland Dingwall de fax es [place] [phone].  Si tiene un asunto urgente cuando la clnica est cerrada y que no puede esperar hasta el siguiente da hbil, puede llamar/localizar a su doctor(a) al nmero que aparece a continuacin.   Por favor, tenga en cuenta que aunque hacemos todo lo posible para estar disponibles para asuntos urgentes fuera del horario de [place], no estamos disponibles las 24 horas del da, los 7 das de la [employer].   Si tiene un problema urgente y no puede comunicarse con nosotros, puede optar por buscar atencin mdica  en el consultorio de su doctor(a), en una clnica privada, en un centro de atencin urgente o en una sala de emergencias.  Si tiene Engineering geologist, por favor llame inmediatamente al 911 o vaya a la sala de emergencias.  Nmeros de bper  - Dr. Nehemiah Massed: [phone]  - Dra. Moye: [phone]  - Dra.  Nicole Kindred: [phone]  En caso de inclemencias del [place], por favor llame a Johnsie Kindred principal al [phone] para una actualizacin sobre el [place] de cualquier retraso o cierre.  Consejos para la medicacin en dermatologa: Por favor, guarde las cajas en las que vienen los medicamentos de uso tpico para ayudarle a seguir las instrucciones sobre dnde y cmo usarlos. Las farmacias generalmente imprimen las instrucciones del medicamento slo en las cajas y no directamente en los tubos del [place].   Si su medicamento es muy caro, por favor, pngase en contacto con Zigmund Daniel llamando al [phone] y presione la opcin 4 o envenos un mensaje a travs de Pharmacist, community.   No podemos decirle cul ser su copago por los medicamentos por adelantado ya que esto es diferente dependiendo de la cobertura de su seguro. Sin embargo, es posible que podamos encontrar un medicamento sustituto a Electrical engineer un formulario para que el seguro cubra el medicamento que se considera necesario.   Si se requiere Solicitor  previa para que su compaa de seguros [place] su medicamento, por favor permtanos de 1 a 2 das hbiles para completar este proceso.  Los precios de los medicamentos varan con frecuencia dependiendo del Environmental consultant de dnde se surte la receta y alguna farmacias pueden ofrecer precios ms baratos.  El sitio web www.goodrx.com tiene cupones para medicamentos de Airline pilot. Los precios aqu no tienen en cuenta lo que podra costar con la ayuda del seguro (puede ser ms barato con su seguro), pero el sitio web puede darle el precio si no utiliz Research scientist (physical sciences).  - Puede imprimir el cupn correspondiente y llevarlo con su receta a la farmacia.  - Tambin puede pasar por nuestra oficina durante el horario de atencin regular y Charity fundraiser una tarjeta de cupones de GoodRx.  - Si necesita que su receta se enve electrnicamente a una farmacia diferente, informe a nuestra oficina a  travs de MyChart de [place] o por telfono llamando al [phone] y presione la opcin 4.

## 2022-08-19 NOTE — Progress Notes (Signed)
New Patient Visit  Subjective  Jane [name] is a 56 y.o. female who presents for the following: New Patient (Initial Visit).  New patient presents for rash that started 3 weeks ago on the right forearm, then spread to the right upper arm, left arm, right leg, left knee, and started a few days ago on her right breast. Rash is itchy and painful. She was given TMC 0.025% cream from her PCP on 08/06/33. She went to the ER on 08/10/22 and was prescribed Prednisone. She only took a couple of pills and had to d/c due to side effects (headaches, red rash on face, "funny feeling"). She was also given hydroxyzine '25mg'$  for itch.   The following portions of the chart were reviewed this encounter and updated as appropriate:       Review of Systems:  No other skin or systemic complaints except as noted in HPI or Assessment and Plan.  Objective  Well appearing patient in no apparent distress; mood and affect are within normal limits.  A focused examination was performed including face, arms. Relevant physical exam findings are noted in the Assessment and Plan.  arms, legs Crusted patch c/w resolving bullae; multiple linear pink patches; scaly erythematous papules and patches +/- dyspigmentation, lichenification, excoriations.   right lower lip Crusted papule     Assessment & Plan  Allergic contact dermatitis due to plants, except food arms, legs  Start clobetasol cream Apply to AA rash BID until improved dsp 60g 1 Rf. Avoid face, groin, axilla. Caution skin atrophy with long-term use.   Pt had adverse effects from Prednisone, so will treat topically, may take longer to clear up.  Topical steroids (such as triamcinolone, fluocinolone, fluocinonide, mometasone, clobetasol, halobetasol, betamethasone, hydrocortisone) can cause thinning and lightening of the skin if they are used for too long in the same area. Your physician has selected the right strength medicine for your problem and area  affected on the body. Please use your medication only as directed by your physician to prevent side effects.   Continue hydroxyzine 25 mg take 1 po QHS as previously prescribed. May make drowsy.   Recommend OTC Gold Bond Rapid Relief Anti-Itch cream (pramoxine + menthol), CeraVe Anti-itch cream or lotion (pramoxine), Sarna lotion (Original- menthol + camphor or Sensitive- pramoxine) or Eucerin 12 hour Itch Relief lotion (menthol) up to 3 times per day to areas on body that are itchy.   clobetasol cream (TEMOVATE) 0.05 % - arms, legs Apply to affected areas rash twice daily until improved. Avoid face, groin, underarms.  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 Condition is bothersome/symptomatic for patient. Currently flared.   Herpes Simplex Virus = Cold Sores = Fever Blisters is a chronic recurring blistering; scabbing sore-producing viral infection that is recurrent usually in the same area triggered by stress, sun/UV exposure and trauma.  It is infectious and can be spread from person to person by direct contact.  It is not curable, but is treatable with topical and oral medication.  Start Valrex take 2 po at first onset of symptoms of fever blister, then take 2 po 12 hours later dsp #30  valACYclovir (VALTREX) 1000 MG tablet - right lower lip Take 2 tablets by mouth at first onset of fever blisters, then take 2 tablets by mouth 12 hours later.   Return as scheduled, for TBSE.  IJamesetta Orleans, CMA, am acting as scribe for Brendolyn Patty, MD .  Documentation: I have reviewed the above documentation for accuracy and completeness, and I agree with the above.  Brendolyn Patty MD

## 2022-08-21 ENCOUNTER — Ambulatory Visit: Payer: Medicaid Other

## 2022-08-21 DIAGNOSIS — Z86718 Personal history of other venous thrombosis and embolism: Secondary | ICD-10-CM

## 2022-08-21 LAB — POCT INR
INR: 2.6 (ref 2.0–3.0)
POC INR: 30.6

## 2022-08-21 NOTE — Patient Instructions (Signed)
Description   5 mg daily F/U in 4 weeks

## 2022-08-22 NOTE — Progress Notes (Signed)
Chief Complaint:   OBESITY Jane [name] is here to discuss her progress with her obesity treatment plan along with follow-up of her obesity related diagnoses. Jane [name] is on the Category 4 Plan and states she is following her eating plan approximately 50% of the time. Jane [name] states she is 7-10,000 steps 7 times per week.  Today's visit was #: 84 Starting weight: 283 lbs Starting date: 03/29/2020 Today's weight: 238 lbs Today's date: 08/19/2022 Total lbs lost to date: 45 lbs Total lbs lost since last in-office visit: 6  Interim History: Jane [name] was on steroids over the last few weeks. She noticed an increase in food intake and felt she had not lost anything. Wants to get more consistently on plan over the next few weeks. Walking 7-10k steps/day. Wants to increase resistance training in next few weeks.  Subjective:   1. Type 2 diabetes mellitus with hyperglycemia, without long-term current use of insulin (HCC) Jane [name]'s last [name] 5.8. She is on Ozempic '1mg'$  weekly. Denies GI side effects.  2. Hyperlipidemia associated with type 2 diabetes mellitus ([place]) Jane [name] is on Lipitor '40mg'$  daily with no myalgias or transaminitis.  Assessment/Plan:   1. Type 2 diabetes mellitus with hyperglycemia, without long-term current use of insulin (HCC) We will refill Ozempic 1 mg SubQ once a week for 1 month with 0 refills.  -Refill Semaglutide, 1 MG/DOSE, 4 MG/3ML SOPN; Inject 1 mg as directed once a week.  Dispense: 9 mL; Refill: 0  2. Hyperlipidemia associated with type 2 diabetes mellitus ([place]) Repeat labs with PCP.  3. Obesity with current BMI of 34.2 Jane [name] is currently in the action stage of change. As such, her goal is to continue with weight loss efforts. She has agreed to the Category 4 Plan.   Exercise goals:  Jane [name] is to increase frequent resistance training.  Behavioral modification strategies: increasing lean protein intake, decreasing eating out, meal planning and cooking strategies, keeping  healthy foods in the home, and dealing with family or coworker sabotage.  Jane [name] has agreed to follow-up with our clinic in 5 weeks. She was informed of the importance of frequent follow-up visits to maximize her success with intensive lifestyle modifications for her multiple health conditions.   Objective:   Blood pressure (!) 110/52, pulse 72, temperature 97.9 F (36.6 C), height '5\' 10"'$  (1.778 m), weight 238 lb (108 kg), SpO2 97 %. Body mass index is 34.15 kg/m.  General: Cooperative, alert, well developed, in no acute distress. HEENT: Conjunctivae and lids unremarkable. Cardiovascular: Regular rhythm.  Lungs: Normal work of breathing. Neurologic: No focal deficits.   Lab Results  Component Value Date   CREATININE 1.33 (H) 08/06/2022   BUN 27 (H) 08/06/2022   NA 141 08/06/2022   K 4.5 08/06/2022   CL 100 08/06/2022   CO2 28 08/06/2022   Lab Results  Component Value Date   ALT 14 08/06/2022   AST 14 08/06/2022   ALKPHOS 101 08/06/2022   BILITOT 0.3 08/06/2022   Lab Results  Component Value Date   HGBA1C 5.8 (A) 04/22/2022   HGBA1C 6.2 (H) 11/06/2021   HGBA1C 6.3 (H) 03/22/2021   HGBA1C 5.8 (A) 10/24/2020   HGBA1C 6.2 (H) 03/29/2020   Lab Results  Component Value Date   INSULIN 21.5 03/29/2020   Lab Results  Component Value Date   TSH 1.420 08/06/2022   Lab Results  Component Value Date   CHOL 175 04/22/2022   HDL 38 (L) 04/22/2022   [place] 95 04/22/2022  TRIG 248 (H) 04/22/2022   CHOLHDL 4.6 (H) 04/22/2022   Lab Results  Component Value Date   VD25OH 47.10 02/21/2022   VD25OH 50.60 01/01/2021   VD25OH 45.30 10/17/2020   Lab Results  Component Value Date   WBC 5.3 08/06/2022   HGB 13.0 08/06/2022   HCT 39.5 08/06/2022   MCV 90 08/06/2022   PLT 125 (L) 08/06/2022   Lab Results  Component Value Date   IRON 87 02/21/2022   TIBC 300 02/21/2022   FERRITIN 133 02/21/2022   Attestation Statements:   Reviewed by clinician on day of visit:  allergies, medications, problem list, medical history, surgical history, family history, social history, and previous encounter notes.  I, Jane [name], RMA am acting as transcriptionist for Jane Common, MD. I have reviewed the above documentation for accuracy and completeness, and I agree with the above. - Jane Common, MD

## 2022-08-23 ENCOUNTER — Inpatient Hospital Stay: Payer: Medicaid Other | Attending: Physician Assistant

## 2022-08-23 DIAGNOSIS — D696 Thrombocytopenia, unspecified: Secondary | ICD-10-CM | POA: Insufficient documentation

## 2022-08-23 DIAGNOSIS — Z7901 Long term (current) use of anticoagulants: Secondary | ICD-10-CM | POA: Insufficient documentation

## 2022-08-23 DIAGNOSIS — N189 Chronic kidney disease, unspecified: Secondary | ICD-10-CM | POA: Diagnosis not present

## 2022-08-23 DIAGNOSIS — D631 Anemia in chronic kidney disease: Secondary | ICD-10-CM | POA: Insufficient documentation

## 2022-08-23 DIAGNOSIS — Z86718 Personal history of other venous thrombosis and embolism: Secondary | ICD-10-CM | POA: Insufficient documentation

## 2022-08-23 DIAGNOSIS — Q969 Turner's syndrome, unspecified: Secondary | ICD-10-CM | POA: Diagnosis not present

## 2022-08-23 DIAGNOSIS — D509 Iron deficiency anemia, unspecified: Secondary | ICD-10-CM | POA: Insufficient documentation

## 2022-08-23 LAB — CBC WITH DIFFERENTIAL/PLATELET
Abs Immature Granulocytes: 0.01 10*3/uL (ref 0.00–0.07)
Basophils Absolute: 0 10*3/uL (ref 0.0–0.1)
Basophils Relative: 1 %
Eosinophils Absolute: 0.1 10*3/uL (ref 0.0–0.5)
Eosinophils Relative: 2 %
HCT: 37.3 % (ref 36.0–46.0)
Hemoglobin: 12.2 g/dL (ref 12.0–15.0)
Immature Granulocytes: 0 %
Lymphocytes Relative: 27 %
Lymphs Abs: 1.2 10*3/uL (ref 0.7–4.0)
MCH: 30.5 pg (ref 26.0–34.0)
MCHC: 32.7 g/dL (ref 30.0–36.0)
MCV: 93.3 fL (ref 80.0–100.0)
Monocytes Absolute: 0.4 10*3/uL (ref 0.1–1.0)
Monocytes Relative: 9 %
Neutro Abs: 2.7 10*3/uL (ref 1.7–7.7)
Neutrophils Relative %: 61 %
Platelets: 117 10*3/uL — ABNORMAL LOW (ref 150–400)
RBC: 4 MIL/uL (ref 3.87–5.11)
RDW: 13.1 % (ref 11.5–15.5)
WBC: 4.4 10*3/uL (ref 4.0–10.5)
nRBC: 0 % (ref 0.0–0.2)

## 2022-08-23 LAB — IRON AND TIBC
Iron: 83 ug/dL (ref 28–170)
Saturation Ratios: 28 % (ref 10.4–31.8)
TIBC: 298 ug/dL (ref 250–450)
UIBC: 215 ug/dL

## 2022-08-23 LAB — FERRITIN: Ferritin: 123 ng/mL (ref 11–307)

## 2022-08-26 ENCOUNTER — Other Ambulatory Visit: Payer: Self-pay | Admitting: Family Medicine

## 2022-08-26 ENCOUNTER — Encounter (HOSPITAL_COMMUNITY): Payer: Self-pay | Admitting: Hematology

## 2022-08-26 DIAGNOSIS — L237 Allergic contact dermatitis due to plants, except food: Secondary | ICD-10-CM

## 2022-08-27 NOTE — Telephone Encounter (Signed)
Requested medication (s) are due for refill today - provider review   Requested medication (s) are on the active medication list -yes  Future visit scheduled -yes  Last refill: 08/06/22 80g  Notes to clinic: non delegated Rx  Requested Prescriptions  Pending Prescriptions Disp Refills   triamcinolone (KENALOG) 0.025 % ointment [Pharmacy Med Name: triamcinolone acetonide 0.025 % topical ointment] 80 g 0    Sig: APPLY TO THE AFFECTED AREA(S) TWICE DAILY     Not Delegated - Dermatology:  Corticosteroids Failed - 08/26/2022  8:05 AM      Failed - This refill cannot be delegated      Passed - Valid encounter within last 12 months    Recent Outpatient Visits           3 weeks ago Other fatigue   [facility] Myles Gip, DO   2 months ago Ear pain, left   [employer], [place], PA-C   2 months ago [place], Jake Church, DO   4 months ago Type 2 diabetes mellitus with stage 3a chronic kidney disease, without long-term current use of insulin ([place])   [facility] [place], Dionne Bucy, MD   6 months ago Diabetic peripheral neuropathy [facility])   [facility], Dionne Bucy, MD       Future Appointments             In 1 month Bacigal[place], Dionne Bucy, MD [facility], [place]   In 2 months Brendolyn Patty, MD [place]   In 4 months McGowan, Gordan Payment [facility] [place]               Requested Prescriptions  Pending Prescriptions Disp Refills   triamcinolone (KENALOG) 0.025 % ointment [Pharmacy Med Name: triamcinolone acetonide 0.025 % topical ointment] 80 g 0    Sig: APPLY TO THE AFFECTED AREA(S) TWICE DAILY     Not Delegated - Dermatology:  Corticosteroids Failed - 08/26/2022  8:05 AM      Failed - This refill cannot be delegated      Passed - Valid encounter within last 12 months    Recent Outpatient Visits           3 weeks ago  Other fatigue   [facility] Myles Gip, DO   2 months ago Ear pain, left   [facility] [place], [place], PA-C   2 months ago [place], Alison M, DO   4 months ago Type 2 diabetes mellitus with stage 3a chronic kidney disease, without long-term current use of insulin ([place])   [facility] Paige, Dionne Bucy, MD   6 months ago Diabetic peripheral neuropathy [facility])   [place], Dionne Bucy, MD       Future Appointments             In 1 month Bacigalupo, Dionne Bucy, MD [facility], [place]   In 2 months Brendolyn Patty, MD [place]   In 4 months McGowan, Shannon A, [place]

## 2022-08-28 ENCOUNTER — Telehealth: Payer: Self-pay | Admitting: Student in an Organized Health Care Education/Training Program

## 2022-08-28 NOTE — Telephone Encounter (Signed)
PA for Hydrocodone faxed. Patient notified.

## 2022-08-28 NOTE — Telephone Encounter (Signed)
PT stated that pharmacy need PA in order to fill patient prescription for hydrocodone 10-325 mg. PT asked to be called when it's been done, pt stated she is at pharmacy now. Thanks

## 2022-08-28 NOTE — Telephone Encounter (Signed)
Patient states pharmacy told her, her meds need PA. She is due to fill today

## 2022-08-29 ENCOUNTER — Encounter (HOSPITAL_COMMUNITY): Payer: Self-pay | Admitting: Hematology

## 2022-08-30 ENCOUNTER — Encounter (HOSPITAL_COMMUNITY): Payer: Self-pay | Admitting: Hematology

## 2022-08-30 ENCOUNTER — Inpatient Hospital Stay (HOSPITAL_BASED_OUTPATIENT_CLINIC_OR_DEPARTMENT_OTHER): Payer: Medicaid Other | Admitting: Physician Assistant

## 2022-08-30 DIAGNOSIS — Z862 Personal history of diseases of the blood and blood-forming organs and certain disorders involving the immune mechanism: Secondary | ICD-10-CM

## 2022-08-30 DIAGNOSIS — Z86718 Personal history of other venous thrombosis and embolism: Secondary | ICD-10-CM

## 2022-08-30 DIAGNOSIS — D509 Iron deficiency anemia, unspecified: Secondary | ICD-10-CM | POA: Diagnosis not present

## 2022-08-30 DIAGNOSIS — D696 Thrombocytopenia, unspecified: Secondary | ICD-10-CM
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
## 2022-08-31 ENCOUNTER — Encounter (HOSPITAL_COMMUNITY): Payer: Self-pay | Admitting: Hematology

## 2022-09-01 ENCOUNTER — Encounter (HOSPITAL_COMMUNITY): Payer: Self-pay | Admitting: Hematology

## 2022-09-02 ENCOUNTER — Telehealth (INDEPENDENT_AMBULATORY_CARE_PROVIDER_SITE_OTHER): Payer: Medicaid Other | Admitting: Psychiatry

## 2022-09-02 ENCOUNTER — Encounter (HOSPITAL_COMMUNITY): Payer: Self-pay | Admitting: Psychiatry

## 2022-09-02 DIAGNOSIS — F900 Attention-deficit hyperactivity disorder, predominantly inattentive type: Secondary | ICD-10-CM | POA: Diagnosis not present

## 2022-09-02 DIAGNOSIS — F5105 Insomnia due to other mental disorder: Secondary | ICD-10-CM

## 2022-09-02 DIAGNOSIS — F3130 Bipolar disorder, current episode depressed, mild or moderate severity, unspecified: Secondary | ICD-10-CM

## 2022-09-02 MED ORDER — AMPHETAMINE-DEXTROAMPHETAMINE 30 MG PO TABS: 30.0000 mg | ORAL_TABLET | Freq: Two times a day (BID) | ORAL | 0 refills | Status: DC

## 2022-09-02 MED ORDER — AMPHETAMINE-DEXTROAMPHETAMINE 30 MG PO TABS: 1.0000 | ORAL_TABLET | Freq: Two times a day (BID) | ORAL | 0 refills | Status: DC

## 2022-09-02 MED ORDER — ALPRAZOLAM 1 MG PO TABS: 1.0000 mg | ORAL_TABLET | Freq: Two times a day (BID) | ORAL | 2 refills | Status: DC | PRN

## 2022-09-02 MED ORDER — FLUOXETINE HCL 20 MG PO CAPS: 20.0000 mg | ORAL_CAPSULE | Freq: Every day | ORAL | 2 refills | Status: DC

## 2022-09-02 MED ORDER — TRAZODONE HCL 100 MG PO TABS: 100.0000 mg | ORAL_TABLET | Freq: Every day | ORAL | 0 refills | Status: DC

## 2022-09-02 MED ORDER — FLUOXETINE HCL 40 MG PO CAPS: 40.0000 mg | ORAL_CAPSULE | Freq: Every day | ORAL | 2 refills | Status: DC
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
## 2022-09-04 ENCOUNTER — Encounter (HOSPITAL_COMMUNITY): Payer: Self-pay | Admitting: Hematology

## 2022-09-05 ENCOUNTER — Encounter (HOSPITAL_COMMUNITY): Payer: Self-pay | Admitting: Hematology

## 2022-09-05 ENCOUNTER — Ambulatory Visit: Payer: Medicaid Other | Admitting: Family Medicine

## 2022-09-05 ENCOUNTER — Encounter: Payer: Self-pay | Admitting: Family Medicine

## 2022-09-05 VITALS — BP 121/73 | HR 60 | Temp 98.1°F | Resp 16 | Wt 243.5 lb

## 2022-09-05 DIAGNOSIS — G4734 Idiopathic sleep related nonobstructive alveolar hypoventilation: Secondary | ICD-10-CM | POA: Insufficient documentation

## 2022-09-05 DIAGNOSIS — N3090 Cystitis, unspecified without hematuria: Secondary | ICD-10-CM

## 2022-09-05 DIAGNOSIS — H9311 Tinnitus, right ear: Secondary | ICD-10-CM

## 2022-09-05 DIAGNOSIS — R319 Hematuria, unspecified: Secondary | ICD-10-CM

## 2022-09-05 LAB — POCT URINALYSIS DIPSTICK
Bilirubin, UA: NEGATIVE
Glucose, UA: NEGATIVE
Ketones, UA: NEGATIVE
Nitrite, UA: NEGATIVE
Protein, UA: NEGATIVE
Spec Grav, UA: 1.02 (ref 1.010–1.025)
Urobilinogen, UA: 0.2 E.U./dL
pH, UA: 6 (ref 5.0–8.0)

## 2022-09-05 MED ORDER — SULFAMETHOXAZOLE-TRIMETHOPRIM 800-160 MG PO TABS: 1.0000 | ORAL_TABLET | Freq: Two times a day (BID) | ORAL | 0 refills | Status: AC

## 2022-09-05 NOTE — Assessment & Plan Note (Signed)
Noted and documented during hospitalization repeatedly Concern for possible OSA Patient warrants sleep study Referral placed today for home sleep study Further treatment with possible CPAP therapy pending results
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
## 2022-09-05 NOTE — Assessment & Plan Note (Signed)
Slowly improving after hospitalization Followed by pain management

## 2022-09-06 LAB — URINALYSIS, MICROSCOPIC ONLY
Bacteria, UA: NONE SEEN
Casts: NONE SEEN /lpf
RBC, Urine: NONE SEEN /hpf (ref 0–2)

## 2022-09-06 NOTE — Addendum Note (Signed)
Addended by: [name] on: 09/06/2022 08:21 AM   Modules accepted: Orders

## 2022-09-08 LAB — URINE CULTURE: Organism ID, Bacteria: NO GROWTH

## 2022-09-09 ENCOUNTER — Telehealth (HOSPITAL_COMMUNITY): Payer: Self-pay | Admitting: *Deleted
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
## 2022-09-10 NOTE — Addendum Note (Signed)
Addended by: Althea Charon D on: 09/10/2022 01:27 PM   Modules accepted: Level of Service

## 2022-09-10 NOTE — Addendum Note (Signed)
Addended by: Althea Charon D on: 09/10/2022 01:21 PM   Modules accepted: Level of Service

## 2022-09-11 ENCOUNTER — Other Ambulatory Visit (HOSPITAL_COMMUNITY): Payer: Self-pay | Admitting: Psychiatry

## 2022-09-11 ENCOUNTER — Other Ambulatory Visit: Payer: Self-pay | Admitting: Family Medicine

## 2022-09-11 ENCOUNTER — Ambulatory Visit (INDEPENDENT_AMBULATORY_CARE_PROVIDER_SITE_OTHER): Payer: Medicaid Other | Admitting: Physician Assistant

## 2022-09-11 DIAGNOSIS — E1142 Type 2 diabetes mellitus with diabetic polyneuropathy: Secondary | ICD-10-CM

## 2022-09-11 DIAGNOSIS — Z86718 Personal history of other venous thrombosis and embolism: Secondary | ICD-10-CM | POA: Diagnosis not present

## 2022-09-11 DIAGNOSIS — M792 Neuralgia and neuritis, unspecified: Secondary | ICD-10-CM

## 2022-09-11 LAB — POCT INR
POC INR: 2
PT: 24.5

## 2022-09-11 NOTE — Telephone Encounter (Signed)
Requested medication (s) are due for refill today: yes  Requested medication (s) are on the active medication list: yes  Last refill:  04/10/22 #120 1 RF  Future visit scheduled: no  Notes to clinic:   med not delegated to NT to reorder   Requested Prescriptions  Pending Prescriptions Disp Refills   pregabalin (LYRICA) 100 MG capsule [Pharmacy Med Name: pregabalin 100 mg capsule] 120 capsule 1    Sig: TAKE ONE CAPSULE BY MOUTH FOUR TIMES DAILY     Not Delegated - Neurology:  Anticonvulsants - Controlled - pregabalin Failed - 09/11/2022  8:04 AM      Failed - This refill cannot be delegated      Failed - Cr in normal range and within 360 days    Creatinine, Ser  Date Value Ref Range Status  08/06/2022 1.33 (H) 0.57 - 1.00 mg/dL Final         Passed - Completed PHQ-2 or PHQ-9 in the last 360 days      Passed - Valid encounter within last 12 months    Recent Outpatient Visits           6 days ago Tinnitus of right ear   [facility] [place], Dionne Bucy, MD   1 month ago Other fatigue   [facility] Myles Gip, DO   2 months ago Ear pain, left   [facility] [place], [place], PA-C   3 months ago [place], Alison M, DO   4 months ago Type 2 diabetes mellitus with stage 3a chronic kidney disease, without long-term current use of insulin [facility])   [facility] [place], Dionne Bucy, MD       Future Appointments             In 2 months Brendolyn Patty, MD [facility]   In 4 months [place], [place]

## 2022-09-11 NOTE — Patient Instructions (Signed)
Description   5 mg daily F/U in 2 weeks

## 2022-09-12 ENCOUNTER — Encounter: Payer: Self-pay | Admitting: Student in an Organized Health Care Education/Training Program

## 2022-09-12 ENCOUNTER — Ambulatory Visit
Payer: Medicaid Other | Attending: Student in an Organized Health Care Education/Training Program | Admitting: Student in an Organized Health Care Education/Training Program

## 2022-09-12 VITALS — BP 165/81 | HR 97 | Temp 97.1°F | Resp 18 | Ht 70.0 in | Wt 240.0 lb

## 2022-09-12 DIAGNOSIS — Z7901 Long term (current) use of anticoagulants: Secondary | ICD-10-CM | POA: Diagnosis present

## 2022-09-12 DIAGNOSIS — Z0289 Encounter for other administrative examinations: Secondary | ICD-10-CM | POA: Diagnosis present

## 2022-09-12 DIAGNOSIS — M5136 Other intervertebral disc degeneration, lumbar region: Secondary | ICD-10-CM | POA: Diagnosis present

## 2022-09-12 DIAGNOSIS — E1142 Type 2 diabetes mellitus with diabetic polyneuropathy: Secondary | ICD-10-CM | POA: Insufficient documentation

## 2022-09-12 DIAGNOSIS — G894 Chronic pain syndrome: Secondary | ICD-10-CM | POA: Diagnosis present

## 2022-09-12 DIAGNOSIS — M7918 Myalgia, other site: Secondary | ICD-10-CM | POA: Insufficient documentation

## 2022-09-12 DIAGNOSIS — M5030 Other cervical disc degeneration, unspecified cervical region: Secondary | ICD-10-CM | POA: Diagnosis present

## 2022-09-12 DIAGNOSIS — M1712 Unilateral primary osteoarthritis, left knee: Secondary | ICD-10-CM | POA: Insufficient documentation

## 2022-09-12 DIAGNOSIS — M792 Neuralgia and neuritis, unspecified: Secondary | ICD-10-CM | POA: Diagnosis present

## 2022-09-12 DIAGNOSIS — I871 Compression of vein: Secondary | ICD-10-CM | POA: Insufficient documentation

## 2022-09-12 DIAGNOSIS — G8929 Other chronic pain: Secondary | ICD-10-CM | POA: Insufficient documentation

## 2022-09-12 DIAGNOSIS — M961 Postlaminectomy syndrome, not elsewhere classified: Secondary | ICD-10-CM | POA: Diagnosis present

## 2022-09-12 MED ORDER — HYDROCODONE-ACETAMINOPHEN 10-325 MG PO TABS: 1.0000 | ORAL_TABLET | Freq: Four times a day (QID) | ORAL | 0 refills | Status: DC | PRN

## 2022-09-12 MED ORDER — METHOCARBAMOL 750 MG PO TABS: 750.0000 mg | ORAL_TABLET | Freq: Two times a day (BID) | ORAL | 5 refills | Status: DC | PRN

## 2022-09-12 MED ORDER — HYDROCODONE-ACETAMINOPHEN 10-325 MG PO TABS: 1.0000 | ORAL_TABLET | Freq: Four times a day (QID) | ORAL | 0 refills | Status: AC | PRN

## 2022-09-12 NOTE — Progress Notes (Signed)
Nursing Pain Medication Assessment:  Safety precautions to be maintained throughout the outpatient stay will include: orient to surroundings, keep bed in low position, maintain call bell within reach at all times, provide assistance with transfer out of bed and ambulation.  Medication Inspection Compliance: Jane [name] did not comply with our request to bring her pills to be counted. She was reminded that bringing the medication bottles, even when empty, is a requirement.  Medication: None brought in. Pill/Patch Count: None available to be counted. Bottle Appearance: No container available. Did not bring bottle(s) to appointment. Filled Date: N/A Last Medication intake:  Today Safety precautions to be maintained throughout the outpatient stay will include: orient to surroundings, keep bed in low position, maintain call bell within reach at all times, provide assistance with transfer out of bed and ambulation.
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
## 2022-09-13 ENCOUNTER — Other Ambulatory Visit: Payer: Self-pay | Admitting: Family Medicine

## 2022-09-13 DIAGNOSIS — I152 Hypertension secondary to endocrine disorders: Secondary | ICD-10-CM

## 2022-09-17 ENCOUNTER — Ambulatory Visit (INDEPENDENT_AMBULATORY_CARE_PROVIDER_SITE_OTHER): Payer: Medicaid Other | Admitting: Family Medicine

## 2022-09-17 ENCOUNTER — Encounter (INDEPENDENT_AMBULATORY_CARE_PROVIDER_SITE_OTHER): Payer: Self-pay | Admitting: Family Medicine

## 2022-09-17 VITALS — BP 130/78 | HR 57 | Temp 97.7°F | Ht 70.0 in | Wt 237.0 lb

## 2022-09-17 DIAGNOSIS — E1165 Type 2 diabetes mellitus with hyperglycemia: Secondary | ICD-10-CM | POA: Diagnosis not present

## 2022-09-17 DIAGNOSIS — E559 Vitamin D deficiency, unspecified: Secondary | ICD-10-CM | POA: Diagnosis not present

## 2022-09-17 DIAGNOSIS — E669 Obesity, unspecified: Secondary | ICD-10-CM

## 2022-09-17 DIAGNOSIS — Z6834 Body mass index (BMI) 34.0-34.9, adult: Secondary | ICD-10-CM

## 2022-09-17 DIAGNOSIS — Z7985 Long-term (current) use of injectable non-insulin antidiabetic drugs: Secondary | ICD-10-CM

## 2022-09-17 MED ORDER — SEMAGLUTIDE (1 MG/DOSE) 4 MG/3ML ~~LOC~~ SOPN: 1.0000 mg | PEN_INJECTOR | SUBCUTANEOUS | 0 refills | Status: DC

## 2022-09-18 ENCOUNTER — Other Ambulatory Visit (HOSPITAL_COMMUNITY): Payer: Self-pay

## 2022-09-18 ENCOUNTER — Ambulatory Visit: Payer: Medicaid Other | Admitting: Family Medicine

## 2022-09-18 ENCOUNTER — Other Ambulatory Visit (INDEPENDENT_AMBULATORY_CARE_PROVIDER_SITE_OTHER): Payer: Self-pay

## 2022-09-18 ENCOUNTER — Encounter (HOSPITAL_COMMUNITY): Payer: Self-pay | Admitting: Hematology

## 2022-09-18 DIAGNOSIS — E1165 Type 2 diabetes mellitus with hyperglycemia: Secondary | ICD-10-CM

## 2022-09-18 MED ORDER — SEMAGLUTIDE (1 MG/DOSE) 4 MG/3ML ~~LOC~~ SOPN
1.0000 mg | PEN_INJECTOR | SUBCUTANEOUS | 0 refills | Status: DC
  Filled 2022-09-18: qty 3, 28d supply, fill #0
  Filled 2022-09-18: qty 9, 84d supply, fill #0
  Filled 2022-10-14: qty 3, 28d supply, fill #1
  Filled 2022-11-11: qty 3, 28d supply, fill #2

## 2022-09-19 ENCOUNTER — Other Ambulatory Visit (HOSPITAL_COMMUNITY): Payer: Self-pay

## 2022-09-23 ENCOUNTER — Other Ambulatory Visit (HOSPITAL_COMMUNITY): Payer: Self-pay

## 2022-09-25 ENCOUNTER — Ambulatory Visit (INDEPENDENT_AMBULATORY_CARE_PROVIDER_SITE_OTHER): Payer: Medicaid Other | Admitting: *Deleted

## 2022-09-25 DIAGNOSIS — Z86718 Personal history of other venous thrombosis and embolism: Secondary | ICD-10-CM | POA: Diagnosis not present

## 2022-09-25 LAB — POCT INR
INR: 2.1 (ref 2.0–3.0)
PT: 25.3

## 2022-09-25 NOTE — Patient Instructions (Signed)
5 mg daily. F/U in 4 weeks.

## 2022-09-27 ENCOUNTER — Encounter: Payer: Medicaid Other | Admitting: Pulmonary Disease

## 2022-09-30 ENCOUNTER — Telehealth: Payer: Self-pay

## 2022-09-30 ENCOUNTER — Other Ambulatory Visit: Payer: Medicaid Other | Admitting: *Deleted

## 2022-09-30 ENCOUNTER — Telehealth: Payer: Self-pay | Admitting: *Deleted

## 2022-09-30 ENCOUNTER — Encounter (HOSPITAL_COMMUNITY): Payer: Self-pay | Admitting: Hematology

## 2022-09-30 ENCOUNTER — Other Ambulatory Visit: Payer: Self-pay

## 2022-09-30 DIAGNOSIS — Z86010 Personal history of colonic polyps: Secondary | ICD-10-CM

## 2022-09-30 MED ORDER — GOLYTELY 236 G PO SOLR: 4000.0000 mL | Freq: Once | ORAL | 0 refills | Status: AC

## 2022-09-30 NOTE — Telephone Encounter (Signed)
Patient is calling for status of  PT mentioned during last office visit. I did not see any notes but exercises that patient was suppose to start, asked if she had received those and she did not,emailed to her address.confirmation received 09/30/22.
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
## 2022-10-01 ENCOUNTER — Telehealth: Payer: Self-pay | Admitting: Family Medicine

## 2022-10-01 ENCOUNTER — Inpatient Hospital Stay: Payer: Medicaid Other | Attending: Physician Assistant

## 2022-10-01 ENCOUNTER — Encounter: Payer: Self-pay | Admitting: Physician Assistant

## 2022-10-01 DIAGNOSIS — D696 Thrombocytopenia, unspecified: Secondary | ICD-10-CM | POA: Diagnosis not present

## 2022-10-01 DIAGNOSIS — Z862 Personal history of diseases of the blood and blood-forming organs and certain disorders involving the immune mechanism: Secondary | ICD-10-CM

## 2022-10-01 DIAGNOSIS — Z86718 Personal history of other venous thrombosis and embolism: Secondary | ICD-10-CM | POA: Insufficient documentation

## 2022-10-01 DIAGNOSIS — D509 Iron deficiency anemia, unspecified: Secondary | ICD-10-CM

## 2022-10-01 LAB — CBC WITH DIFFERENTIAL/PLATELET
Abs Immature Granulocytes: 0.01 10*3/uL (ref 0.00–0.07)
Basophils Absolute: 0 10*3/uL (ref 0.0–0.1)
Basophils Relative: 0 %
Eosinophils Absolute: 0.1 10*3/uL (ref 0.0–0.5)
Eosinophils Relative: 1 %
HCT: 37.8 % (ref 36.0–46.0)
Hemoglobin: 12.4 g/dL (ref 12.0–15.0)
Immature Granulocytes: 0 %
Lymphocytes Relative: 13 %
Lymphs Abs: 0.8 10*3/uL (ref 0.7–4.0)
MCH: 31.2 pg (ref 26.0–34.0)
MCHC: 32.8 g/dL (ref 30.0–36.0)
MCV: 95 fL (ref 80.0–100.0)
Monocytes Absolute: 0.5 10*3/uL (ref 0.1–1.0)
Monocytes Relative: 8 %
Neutro Abs: 5.1 10*3/uL (ref 1.7–7.7)
Neutrophils Relative %: 78 %
Platelets: 136 10*3/uL — ABNORMAL LOW (ref 150–400)
RBC: 3.98 MIL/uL (ref 3.87–5.11)
RDW: 13.5 % (ref 11.5–15.5)
WBC: 6.5 10*3/uL (ref 4.0–10.5)
nRBC: 0 % (ref 0.0–0.2)

## 2022-10-01 LAB — COMPREHENSIVE METABOLIC PANEL
ALT: 15 U/L (ref 0–44)
AST: 16 U/L (ref 15–41)
Albumin: 4 g/dL (ref 3.5–5.0)
Alkaline Phosphatase: 80 U/L (ref 38–126)
Anion gap: 8 (ref 5–15)
BUN: 28 mg/dL — ABNORMAL HIGH (ref 6–20)
CO2: 29 mmol/L (ref 22–32)
Calcium: 9.9 mg/dL (ref 8.9–10.3)
Chloride: 101 mmol/L (ref 98–111)
Creatinine, Ser: 1.3 mg/dL — ABNORMAL HIGH (ref 0.44–1.00)
GFR, Estimated: 48 mL/min — ABNORMAL LOW (ref >60)
Glucose, Bld: 104 mg/dL — ABNORMAL HIGH (ref 70–99)
Potassium: 4.1 mmol/L (ref 3.5–5.1)
Sodium: 138 mmol/L (ref 135–145)
Total Bilirubin: 0.4 mg/dL (ref 0.3–1.2)
Total Protein: 7.4 g/dL (ref 6.5–8.1)

## 2022-10-01 LAB — IRON AND TIBC
Iron: 35 ug/dL (ref 28–170)
Saturation Ratios: 11 % (ref 10.4–31.8)
TIBC: 306 ug/dL (ref 250–450)
UIBC: 271 ug/dL

## 2022-10-01 LAB — FERRITIN: Ferritin: 89 ng/mL (ref 11–307)

## 2022-10-01 MED ORDER — ENOXAPARIN SODIUM 100 MG/ML IJ SOSY: 100.0000 mg | PREFILLED_SYRINGE | Freq: Two times a day (BID) | INTRAMUSCULAR | 0 refills | Status: DC

## 2022-10-01 NOTE — Telephone Encounter (Signed)
Patient is scheduled for colonoscopy on 12/09/22. 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  She has failed DOAC therapy.  She is high risk for clot.  For her colonoscopy, we will hold her warfarin 7 days prior to procedure and resume the day after procedure.  She will need Lovenox bridging before procedure and until her INR is therapeutic postprocedure.  I will send in her Lovenox.  She will need to recheck her INR 3 days postprocedure we will follow closely until therapeutic.  I will fax this information to [facility] as requested.  Please let the patient know.
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
## 2022-10-03 NOTE — Telephone Encounter (Signed)
Called patient to advise. LMTCB. Okay for PEC to advise.

## 2022-10-03 NOTE — Telephone Encounter (Signed)
Called, left VM to call back in regards to message from provider.

## 2022-10-04 ENCOUNTER — Encounter (HOSPITAL_COMMUNITY): Payer: Self-pay | Admitting: Hematology

## 2022-10-07 ENCOUNTER — Other Ambulatory Visit: Payer: Self-pay | Admitting: Physician Assistant

## 2022-10-07 ENCOUNTER — Telehealth: Payer: Self-pay

## 2022-10-07 NOTE — Telephone Encounter (Signed)
Copied from [place] [phone]. Topic: Appointment Scheduling - Scheduling Inquiry for Clinic >> Oct 07, 2022  2:25 PM Erskine Squibb wrote: Reason for CRM: The patient called in needing to reschedule her INR appt that was scheduled for the 14th. She has moved it until the 20th and if there are any questions please call her.

## 2022-10-07 NOTE — Progress Notes (Signed)
Feraheme x 1 due to symptomatic iron deficiency

## 2022-10-08 ENCOUNTER — Telehealth: Payer: Self-pay | Admitting: Family Medicine

## 2022-10-08 ENCOUNTER — Other Ambulatory Visit (HOSPITAL_COMMUNITY): Payer: Self-pay | Admitting: Psychiatry

## 2022-10-08 NOTE — Telephone Encounter (Signed)
Patent needs directions for the warfarin and Lovenox injections before her colonoscopy. Please call with specifics.

## 2022-10-09 ENCOUNTER — Encounter (INDEPENDENT_AMBULATORY_CARE_PROVIDER_SITE_OTHER): Payer: Self-pay | Admitting: Family Medicine

## 2022-10-09 ENCOUNTER — Ambulatory Visit (INDEPENDENT_AMBULATORY_CARE_PROVIDER_SITE_OTHER): Payer: Medicaid Other | Admitting: Family Medicine

## 2022-10-09 VITALS — BP 123/66 | HR 61 | Temp 98.1°F | Ht 70.0 in | Wt 234.0 lb

## 2022-10-09 DIAGNOSIS — Z6833 Body mass index (BMI) 33.0-33.9, adult: Secondary | ICD-10-CM

## 2022-10-09 DIAGNOSIS — E785 Hyperlipidemia, unspecified: Secondary | ICD-10-CM

## 2022-10-09 DIAGNOSIS — E1165 Type 2 diabetes mellitus with hyperglycemia: Secondary | ICD-10-CM

## 2022-10-09 DIAGNOSIS — E669 Obesity, unspecified: Secondary | ICD-10-CM | POA: Diagnosis not present

## 2022-10-09 DIAGNOSIS — E1169 Type 2 diabetes mellitus with other specified complication: Secondary | ICD-10-CM | POA: Diagnosis not present

## 2022-10-09 DIAGNOSIS — Z7985 Long-term (current) use of injectable non-insulin antidiabetic drugs: Secondary | ICD-10-CM

## 2022-10-09 NOTE — Telephone Encounter (Signed)
Called, left VM to call back.

## 2022-10-09 NOTE — Telephone Encounter (Signed)
NA. LMTCB

## 2022-10-11 ENCOUNTER — Telehealth: Payer: Self-pay

## 2022-10-11 NOTE — Telephone Encounter (Signed)
Blood thinner advice received by fax on 10/01/22 from Dr. Brita Romp.  Patient has been advised by her office staff.  Stop Warfarin 7 days prior to procedure.  Restart 1 day after procedure.  She will need lovenox bridging before procedure and until INR therapeutic post procedure.  Thanks,  [place], [place]

## 2022-10-14 ENCOUNTER — Other Ambulatory Visit (HOSPITAL_COMMUNITY): Payer: Self-pay

## 2022-10-15 NOTE — Telephone Encounter (Signed)
Lmtcb.

## 2022-10-16 ENCOUNTER — Encounter: Payer: Self-pay | Admitting: Hematology

## 2022-10-17 ENCOUNTER — Encounter: Payer: Self-pay | Admitting: Physician Assistant

## 2022-10-17 ENCOUNTER — Ambulatory Visit: Payer: Medicaid Other | Admitting: Family Medicine

## 2022-10-17 ENCOUNTER — Telehealth (INDEPENDENT_AMBULATORY_CARE_PROVIDER_SITE_OTHER): Payer: Medicaid Other | Admitting: Physician Assistant

## 2022-10-17 ENCOUNTER — Encounter: Payer: Self-pay | Admitting: Family Medicine

## 2022-10-17 ENCOUNTER — Encounter (HOSPITAL_COMMUNITY): Payer: Self-pay

## 2022-10-17 VITALS — BP 129/71 | Temp 102.3°F

## 2022-10-17 DIAGNOSIS — J069 Acute upper respiratory infection, unspecified: Secondary | ICD-10-CM

## 2022-10-17 MED ORDER — BENZONATATE 100 MG PO CAPS: 100.0000 mg | ORAL_CAPSULE | Freq: Two times a day (BID) | ORAL | 0 refills | Status: DC | PRN

## 2022-10-17 NOTE — Telephone Encounter (Signed)
Set her up for a virtual visit please

## 2022-10-17 NOTE — Progress Notes (Signed)
I,Jane [name],acting as a Education administrator for [employer], PA-C.,have documented all relevant documentation on the behalf of Jane Kirschner, PA-C,as directed by  Jane Kirschner, PA-C while in the presence of Jane Kirschner, PA-C.  MyChart Video Visit    Virtual Visit via Video Note   This format is felt to be most appropriate for this patient at this time. Physical exam was limited by quality of the video and audio technology used for the visit.   Patient location: Home  Provider location: [facility]  I discussed the limitations of evaluation and management by telemedicine and the availability of in person appointments. The patient expressed understanding and agreed to proceed.  Patient: Jane [name]   DOB: 1966/06/29   56 y.o. Female  MRN: [id number] Visit Date: 10/17/2022  Today's healthcare provider: [name], PA-C   Cc. Cough, body aches, fevers x 2 days Subjective    HPI  Cough: Patient complains of cough. Symptoms began 2 days ago. Cough described as productive, without wheezing, dyspnea or hemoptysis. Patient denies sweats. Associated symptoms include chills, fever, headache  , nasal congestion, sneezing, and sore throat. .   Pt complains of fever around 102-100 degrees for the past 2 days. Reports tmax of 102.10F Pt states that when she coughs she has brown sputum that comes up.   Denies SOB, wheezing, chest pain.   Medications: Outpatient Medications Prior to Visit  Medication Sig   ALPRAZolam (XANAX) 1 MG tablet Take 1 tablet (1 mg total) by mouth 2 (two) times daily as needed. for anxiety   amphetamine-dextroamphetamine (ADDERALL) 30 MG tablet Take 1 tablet by mouth 2 (two) times daily.   amphetamine-dextroamphetamine (ADDERALL) 30 MG tablet Take 1 tablet by mouth 2 (two) times daily.   aspirin EC 81 MG tablet Take 81 mg by mouth daily.   aspirin-acetaminophen-caffeine (EXCEDRIN MIGRAINE) 250-250-65 MG tablet Take 2 tablets by mouth every 6  (six) hours as needed for headache.   atorvastatin (LIPITOR) 40 MG tablet TAKE 1 TABLET BY MOUTH EVERY DAY   cetirizine (ZYRTEC) 10 MG tablet TAKE 1 TABLET BY MOUTH DAILY   colchicine 0.6 MG tablet Take 1.'2mg'$  (2 tablets) then 0.'6mg'$  (1 tablet) 1 hour after. Then, take 1 tablet every day for 7 days.   famotidine (PEPCID) 20 MG tablet Take by mouth.   FLUoxetine (PROZAC) 20 MG capsule Take 1 capsule (20 mg total) by mouth daily. Take with 40 mg to equal 60 mg daily   FLUoxetine (PROZAC) 40 MG capsule Take 1 capsule (40 mg total) by mouth daily. Take with 20 mg to equal 60 mg daily   GARLIC PO Take 1 tablet by mouth daily.   hydrochlorothiazide (HYDRODIURIL) 25 MG tablet TAKE 1 TABLET BY MOUTH DAILY Please schedule office visit BEFORE any future refills   HYDROcodone-acetaminophen (NORCO) 10-325 MG tablet Take 1 tablet by mouth every 6 (six) hours as needed. To last 30 days from fill date   [START ON 10/27/2022] HYDROcodone-acetaminophen (NORCO) 10-325 MG tablet Take 1 tablet by mouth every 6 (six) hours as needed. To last 30 days from fill date   [START ON 11/26/2022] HYDROcodone-acetaminophen (NORCO) 10-325 MG tablet Take 1 tablet by mouth every 6 (six) hours as needed. To last 30 days from fill date   hydrOXYzine (ATARAX) 25 MG tablet Take by mouth.   levothyroxine (SYNTHROID) 125 MCG tablet Take 125 mcg by mouth daily before breakfast.   LINZESS 145 MCG CAPS capsule TAKE ONE CAPSULE BY MOUTH DAILY  lisinopril (ZESTRIL) 5 MG tablet TAKE 1 TABLET BY MOUTH DAILY   methocarbamol (ROBAXIN) 750 MG tablet Take 1 tablet (750 mg total) by mouth 2 (two) times daily as needed for muscle spasms.   Multiple Vitamin (MULTIVITAMIN) capsule Take 1 capsule by mouth daily.   omega-3 acid ethyl esters (LOVAZA) 1 g capsule Take by mouth.   Omega-3 Fatty Acids (OMEGA-3 FISH OIL PO) Take 1 capsule by mouth daily.   pantoprazole (PROTONIX) 40 MG tablet TAKE 1 TABLET BY MOUTH DAILY   polyethylene glycol powder  (GLYCOLAX/MIRALAX) 17 GM/SCOOP powder Take 17 g by mouth daily.   potassium chloride (KLOR-CON M) 10 MEQ tablet TAKE 1 TABLET BY MOUTH THREE TIMES DAILY   pregabalin (LYRICA) 100 MG capsule TAKE ONE CAPSULE BY MOUTH FOUR TIMES DAILY   rOPINIRole (REQUIP) 2 MG tablet TAKE 1 TABLET BY MOUTH TWICE DAILY   Semaglutide, 1 MG/DOSE, 4 MG/3ML SOPN Inject 1 mg as directed once a week.   traZODone (DESYREL) 100 MG tablet Take 1 tablet (100 mg total) by mouth at bedtime.   valACYclovir (VALTREX) 1000 MG tablet Take 2 tablets by mouth at first onset of fever blisters, then take 2 tablets by mouth 12 hours later.   Vibegron (GEMTESA) 75 MG TABS Take 75 mg by mouth daily.   warfarin (COUMADIN) 5 MG tablet TAKE 1 AND 1/2 TABLETS BY MOUTH ON TUEDAYS AND THURSDAYS, THEN TAKE 1 TABLET DAILY ON ALL OTHER DAYS (Patient taking differently: Take 5 mg by mouth daily.)   enoxaparin (LOVENOX) 100 MG/ML injection Inject 1 mL (100 mg total) into the skin every 12 (twelve) hours for 14 days. Start 7 days prior to colonoscopy and continue until we tell you to stop post-procedure when INR is at goal. Hold morning of colonoscopy.   No facility-administered medications prior to visit.    Review of Systems  Constitutional:  Positive for fatigue and fever.  HENT:  Positive for congestion, rhinorrhea, sinus pressure, sinus pain and sore throat.   Respiratory:  Positive for cough. Negative for shortness of breath.   Cardiovascular:  Negative for chest pain and leg swelling.  Gastrointestinal:  Negative for abdominal pain.  Neurological:  Positive for headaches. Negative for dizziness.     Objective    BP 129/71 (BP Location: Right Wrist, Patient Position: Sitting, Cuff Size: Normal)   Temp (!) 102.3 F (39.1 C)   LMP  (LMP Unknown)  Blood pressure 129/71, temperature (!) 102.3 F (39.1 C).    Physical Exam     Assessment & Plan     URI Advised in a viral illness antibiotics are not effective Prescribed tessalon  for cough Advised that she can take a total of 3,000 mg of tylenol a day. Given the 4 times a day pain med use, this would be an additional 1,000 mg of tylenol if needed Increase fluids, rest .  If fever of 104F persistent at home, shortness of breath, dizziness, hemoptysis, please go to ED  If symptoms persist please make an appointment in the office  Return if symptoms worsen or fail to improve.     I discussed the assessment and treatment plan with the patient. The patient was provided an opportunity to ask questions and all were answered. The patient agreed with the plan and demonstrated an understanding of the instructions.   The patient was advised to call back or seek an in-person evaluation if the symptoms worsen or if the condition fails to improve as anticipated.  I  provided 10 minutes of non-face-to-face time during this encounter.  I, Jane Kirschner, PA-C have reviewed all documentation for this visit. The documentation on  10/17/2022  for the exam, diagnosis, procedures, and orders are all accurate and complete.  Jane Kirschner, PA-C [facility] [address] [place], [place], [address] Office: [phone] Fax: [place]

## 2022-10-18 ENCOUNTER — Telehealth (HOSPITAL_COMMUNITY): Payer: Medicaid Other | Admitting: Psychiatry

## 2022-10-18 ENCOUNTER — Encounter (HOSPITAL_COMMUNITY): Payer: Self-pay

## 2022-10-21 ENCOUNTER — Other Ambulatory Visit: Payer: Self-pay | Admitting: Unknown Physician Specialty

## 2022-10-21 ENCOUNTER — Telehealth (HOSPITAL_COMMUNITY): Payer: Medicaid Other | Admitting: Psychiatry

## 2022-10-21 DIAGNOSIS — H93A1 Pulsatile tinnitus, right ear: Secondary | ICD-10-CM

## 2022-10-22 ENCOUNTER — Ambulatory Visit: Payer: Medicaid Other | Admitting: Family Medicine

## 2022-10-23 ENCOUNTER — Telehealth: Payer: Self-pay

## 2022-10-23 ENCOUNTER — Ambulatory Visit: Payer: Medicaid Other

## 2022-10-23 DIAGNOSIS — N3281 Overactive bladder: Secondary | ICD-10-CM | POA: Insufficient documentation

## 2022-10-23 NOTE — Telephone Encounter (Signed)
Prior auth form faxed to [employer] for approval.
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
## 2022-10-31 ENCOUNTER — Inpatient Hospital Stay: Payer: Medicaid Other | Attending: Physician Assistant

## 2022-10-31 VITALS — BP 119/74 | HR 64 | Temp 97.9°F | Resp 20

## 2022-10-31 DIAGNOSIS — D649 Anemia, unspecified: Secondary | ICD-10-CM

## 2022-10-31 DIAGNOSIS — D509 Iron deficiency anemia, unspecified: Secondary | ICD-10-CM | POA: Insufficient documentation

## 2022-10-31 MED ORDER — LORATADINE 10 MG PO TABS
10.0000 mg | ORAL_TABLET | Freq: Once | ORAL | Status: AC
  Administered 2022-10-31: 10 mg via ORAL
  Filled 2022-10-31: qty 1

## 2022-10-31 MED ORDER — ACETAMINOPHEN 325 MG PO TABS
650.0000 mg | ORAL_TABLET | Freq: Once | ORAL | Status: AC
  Administered 2022-10-31: 650 mg via ORAL
  Filled 2022-10-31: qty 2

## 2022-10-31 MED ORDER — SODIUM CHLORIDE 0.9 % IV SOLN
510.0000 mg | Freq: Once | INTRAVENOUS | Status: AC
  Administered 2022-10-31: 510 mg via INTRAVENOUS
  Filled 2022-10-31: qty 17

## 2022-10-31 MED ORDER — SODIUM CHLORIDE 0.9 % IV SOLN: Freq: Once | INTRAVENOUS | Status: AC

## 2022-10-31 NOTE — Progress Notes (Signed)
Patient tolerated iron infusion with no complaints voiced.  Peripheral IV site clean and dry with good blood return noted before and after infusion.  Band aid applied.  VSS with discharge and left in satisfactory condition with no s/s of distress noted.   

## 2022-10-31 NOTE — Patient Instructions (Signed)
[facility]  Discharge Instructions: Thank you for choosing [facility] to provide your oncology and hematology care.  If you have a lab appointment with the Cancer Center, please come in thru the Main Entrance and check in at the main information desk.  Wear comfortable clothing and clothing appropriate for easy access to any Portacath or PICC line.   We strive to give you quality time with your provider. You may need to reschedule your appointment if you arrive late (15 or more minutes).  Arriving late affects you and other patients whose appointments are after yours.  Also, if you miss three or more appointments without notifying the office, you may be dismissed from the clinic at the provider's discretion.      For prescription refill requests, have your pharmacy contact our office and allow 72 hours for refills to be completed.    To help prevent nausea and vomiting after your treatment, we encourage you to take your nausea medication as directed.  BELOW ARE SYMPTOMS THAT SHOULD BE REPORTED IMMEDIATELY: *FEVER GREATER THAN 100.4 F (38 C) OR HIGHER *CHILLS OR SWEATING *NAUSEA AND VOMITING THAT IS NOT CONTROLLED WITH YOUR NAUSEA MEDICATION *UNUSUAL SHORTNESS OF BREATH *UNUSUAL BRUISING OR BLEEDING *URINARY PROBLEMS (pain or burning when urinating, or frequent urination) *BOWEL PROBLEMS (unusual diarrhea, constipation, pain near the anus) TENDERNESS IN MOUTH AND THROAT WITH OR WITHOUT PRESENCE OF ULCERS (sore throat, sores in mouth, or a toothache) UNUSUAL RASH, SWELLING OR PAIN  UNUSUAL VAGINAL DISCHARGE OR ITCHING   Items with * indicate a potential emergency and should be followed up as soon as possible or go to the Emergency Department if any problems should occur.  Please show the CHEMOTHERAPY ALERT CARD or IMMUNOTHERAPY ALERT CARD at check-in to the Emergency Department and triage nurse.  Should you have questions after your visit or need to  cancel or reschedule your appointment, please contact [facility] AT  [phone]  and follow the prompts.  Office hours are 8:00 a.m. to 4:30 p.m. Monday - Friday. Please note that voicemails left after 4:00 p.m. may not be returned until the following business day.  We are closed weekends and major holidays. You have access to a nurse at all times for urgent questions. Please call the main number to the clinic [phone] and follow the prompts.  For any non-urgent questions, you may also contact your provider using MyChart. We now offer e-Visits for anyone 18 and older to request care online for non-urgent symptoms. For details visit [email or web address].   Also download the MyChart app! Go to the app store, search "MyChart", open the app, select [facility], and log in with your MyChart username and password.   

## 2022-11-01 ENCOUNTER — Telehealth (INDEPENDENT_AMBULATORY_CARE_PROVIDER_SITE_OTHER): Payer: Medicaid Other | Admitting: Psychiatry

## 2022-11-01 ENCOUNTER — Encounter (HOSPITAL_COMMUNITY): Payer: Self-pay | Admitting: Psychiatry

## 2022-11-01 DIAGNOSIS — F900 Attention-deficit hyperactivity disorder, predominantly inattentive type: Secondary | ICD-10-CM | POA: Diagnosis not present

## 2022-11-01 DIAGNOSIS — F3130 Bipolar disorder, current episode depressed, mild or moderate severity, unspecified: Secondary | ICD-10-CM

## 2022-11-01 DIAGNOSIS — F5105 Insomnia due to other mental disorder: Secondary | ICD-10-CM | POA: Diagnosis not present

## 2022-11-01 MED ORDER — ALPRAZOLAM 1 MG PO TABS: 1.0000 mg | ORAL_TABLET | Freq: Two times a day (BID) | ORAL | 2 refills | Status: DC | PRN

## 2022-11-01 MED ORDER — FLUOXETINE HCL 40 MG PO CAPS: 40.0000 mg | ORAL_CAPSULE | Freq: Every day | ORAL | 2 refills | Status: DC

## 2022-11-01 MED ORDER — AMPHETAMINE-DEXTROAMPHETAMINE 30 MG PO TABS: 1.0000 | ORAL_TABLET | Freq: Two times a day (BID) | ORAL | 0 refills | Status: DC

## 2022-11-01 MED ORDER — FLUOXETINE HCL 20 MG PO CAPS: 20.0000 mg | ORAL_CAPSULE | Freq: Every day | ORAL | 2 refills | Status: DC

## 2022-11-01 MED ORDER — AMPHETAMINE-DEXTROAMPHETAMINE 30 MG PO TABS: 30.0000 mg | ORAL_TABLET | Freq: Two times a day (BID) | ORAL | 0 refills | Status: DC

## 2022-11-01 MED ORDER — TRAZODONE HCL 150 MG PO TABS: 150.0000 mg | ORAL_TABLET | Freq: Every day | ORAL | 2 refills | Status: DC

## 2022-11-01 NOTE — Progress Notes (Signed)
I,Sulibeya S Dimas,acting as a Neurosurgeon for Shirlee Latch, MD.,have documented all relevant documentation on the behalf of Shirlee Latch, MD,as directed by  Shirlee Latch, MD while in the presence of Shirlee Latch, MD.    Complete physical exam   Patient: Jane [name]   DOB: 01-07-1966   56 y.o. Female  MRN: [id number] Visit Date: 11/07/2022  Today's healthcare provider: Shirlee Latch, MD   Chief Complaint  Patient presents with   Annual Exam   Subjective    Jane [name] is a 56 y.o. female who presents today for a complete physical exam.  She reports consuming a general and high protein  diet. The patient does not participate in regular exercise at present. She generally feels poorly. She reports sleeping poorly. She does have additional problems to discuss today.  HPI  11/26/22 appt for Mammogram in [place].   Having a lot of migraines recently x few weeks.  Taking excedrin and it works but coming back.  Having them most days.  Cough since thanksgiving - thinks she had RSV at that time. Better except for cough  Sleep study on 11/05/22 - AHI 14.3 Does not have CPAP, having AM headaches, snoring, PLM. Poor sleep quality.  Fell on 11/03/22. Landed on L knee and shoulder. Has upcoming appt with Ortho.  Getting slightly better, but shoulder is still painful.  Past Medical History:  Diagnosis Date   ADD (attention deficit disorder)    Anxiety    Back pain    Bilateral swelling of feet    Bipolar 1 disorder (HCC)    Bipolar 1 disorder (HCC)    Bipolar disorder (HCC)    Chewing difficulty    Chronic fatigue syndrome    Chronic kidney disease    Stage 3 kidney disease;dx by Dr. Kari Baars.    Constipation    Depression    Diabetes mellitus without complication (HCC)    diet controlled   Dyspnea    with exertion   GERD (gastroesophageal reflux disease)    Headache    migraines   High cholesterol    History of blood clots    History of DVT (deep  vein thrombosis)    left leg   Hypertension    states under control with meds., has been on med. x 2 years   Hypothyroidism    Joint pain    Neuropathy    Obsessive-compulsive disorder    Peripheral vascular disease (HCC)    Prediabetes    Respiratory failure requiring intubation (HCC)    Restless leg syndrome    Shortness of breath    Sleep apnea    Thrombocytopenia (HCC) 09/15/2019   Trigger thumb of left hand 01/2018   Trigger thumb of right hand    Past Surgical History:  Procedure Laterality Date   ABDOMINAL HYSTERECTOMY  06/2016   complete   APPLICATION OF WOUND VAC Left 04/27/2019   Procedure: APPLICATION OF WOUND VAC LEFT GROIN;  Surgeon: Maeola Harman, MD;  Location: [facility] OR;  Service: Vascular;  Laterality: Left;   AV FISTULA PLACEMENT Left 11/24/2018   Procedure: ARTERIOVENOUS (AV) FISTULA CREATION LEFT SFA TO LEFT FEMORAL VEIN;  Surgeon: Maeola Harman, MD;  Location: [facility] OR;  Service: Vascular;  Laterality: Left;   BACK SURGERY     CHOLECYSTECTOMY     COLONOSCOPY WITH PROPOFOL N/A 11/22/2019   Procedure: COLONOSCOPY WITH PROPOFOL;  Surgeon: Wyline Mood, MD;  Location: [facility] ENDOSCOPY;  Service: Gastroenterology;  Laterality: N/A;  FEMORAL ARTERY EXPLORATION  03/30/2019   Procedure: Left Common Femoral Artery and Vein Exploration;  Surgeon: Maeola Harman, MD;  Location: [facility] OR;  Service: Vascular;;   FEMORAL-FEMORAL BYPASS GRAFT Left 11/24/2018   Procedure: BYPASS GRAFT FEMORAL-FEMORAL VENOUS LEFT TO RIGHT PALMA PROCEDURE USING CRYOVEIN;  Surgeon: Maeola Harman, MD;  Location: [facility] OR;  Service: Vascular;  Laterality: Left;   GROIN DEBRIDEMENT Left 04/27/2019   Procedure: GROIN DEBRIDEMENT;  Surgeon: Maeola Harman, MD;  Location: [facility] OR;  Service: Vascular;  Laterality: Left;   INSERTION OF ILIAC STENT  03/30/2019   Procedure: Stent of left common, external iliac veins and left common femoral vein;  Surgeon: Maeola Harman, MD;  Location: [facility] OR;  Service: Vascular;;   KNEE ARTHROSCOPY WITH MENISCAL REPAIR Left 11/14/2020   Procedure: LEFT KNEE ARTHROSCOPY WITH PARTIAL MEDIAL MENISCECTOMY;  Surgeon: Vickki Hearing, MD;  Location: [facility]S;  Service: Orthopedics;  Laterality: Left;   LOWER EXTREMITY VENOGRAPHY N/A 08/17/2018   Procedure: LOWER EXTREMITY VENOGRAPHY - Central Venogram;  Surgeon: Maeola Harman, MD;  Location: [facility] INVASIVE CV LAB;  Service: Cardiovascular;  Laterality: N/A;   LOWER EXTREMITY VENOGRAPHY Bilateral 03/09/2019   Procedure: LOWER EXTREMITY VENOGRAPHY;  Surgeon: Maeola Harman, MD;  Location: [facility] INVASIVE CV LAB;  Service: Cardiovascular;  Laterality: Bilateral;   LOWER EXTREMITY VENOGRAPHY Left 08/16/2019   Procedure: LOWER EXTREMITY VENOGRAPHY;  Surgeon: Maeola Harman, MD;  Location: [facility] INVASIVE CV LAB;  Service: Cardiovascular;  Laterality: Left;   LOWER EXTREMITY VENOGRAPHY Left 03/25/2022   Procedure: LOWER EXTREMITY VENOGRAPHY;  Surgeon: Maeola Harman, MD;  Location: [facility] INVASIVE CV LAB;  Service: Cardiovascular;  Laterality: Left;  IVUS   LUMBAR FUSION  11/21/2000   L5-S1   LUMBAR SPINE SURGERY     x 2 others   PATCH ANGIOPLASTY Left 03/30/2019   Procedure: Patch Angioplasty of the Left Common Femoral Vein using Venosure Biologic patch;  Surgeon: Maeola Harman, MD;  Location: [facility] OR;  Service: Vascular;  Laterality: Left;   PERIPHERAL VASCULAR INTERVENTION Left 08/16/2019   Procedure: PERIPHERAL VASCULAR INTERVENTION;  Surgeon: Maeola Harman, MD;  Location: [facility] INVASIVE CV LAB;  Service: Cardiovascular;  Laterality: Left;  common femoral/femoral vein stent   PERIPHERAL VASCULAR INTERVENTION Left 03/25/2022   Procedure: PERIPHERAL VASCULAR INTERVENTION;  Surgeon: Maeola Harman, MD;  Location: [facility] INVASIVE CV LAB;  Service: Cardiovascular;  Laterality: Left;  COMMON FEMORAL VEIN   PERIPHERAL VASCULAR  THROMBECTOMY Left 03/25/2022   Procedure: PERIPHERAL VASCULAR THROMBECTOMY;  Surgeon: Maeola Harman, MD;  Location: [facility] INVASIVE CV LAB;  Service: Cardiovascular;  Laterality: Left;  EXTREMITY/IVC   TRIGGER FINGER RELEASE Right 12/01/2017   Procedure: RELEASE TRIGGER FINGER/A-1 PULLEY RIGHT THUMB;  Surgeon: Betha Loa, MD;  Location: [facility];  Service: Orthopedics;  Laterality: Right;   TRIGGER FINGER RELEASE Left 01/26/2018   Procedure: LEFT TRIGGER THUMB RELEASE;  Surgeon: Betha Loa, MD;  Location: [facility];  Service: Orthopedics;  Laterality: Left;   ULTRASOUND GUIDANCE FOR VASCULAR ACCESS Right 03/30/2019   Procedure: Ultrasound-guided cannulation right internal jugular vein;  Surgeon: Maeola Harman, MD;  Location: [facility] OR;  Service: Vascular;  Laterality: Right;   Social History   Socioeconomic History   Marital status: Divorced    Spouse name: Not on file   Number of children: 3   Years of education: Not on file   Highest education level: Not on file  Occupational History  Not on file  Tobacco Use   Smoking status: Never   Smokeless tobacco: Never  Vaping Use   Vaping Use: Never used  Substance and Sexual Activity   Alcohol use: No   Drug use: No   Sexual activity: Not Currently    Partners: Male    Birth control/protection: Surgical  Other Topics Concern   Not on file  Social History Narrative   Not on file   Social Determinants of Health   Financial Resource Strain: High Risk (10/25/2020)   Overall Financial Resource Strain (CARDIA)    Difficulty of Paying Living Expenses: Hard  Food Insecurity: Food Insecurity Present (05/27/2022)   Hunger Vital Sign    Worried About Running Out of Food in the Last Year: Sometimes true    Ran Out of Food in the Last Year: Sometimes true  Transportation Needs: No Transportation Needs (05/27/2022)   PRAPARE - Administrator, Civil Service (Medical): No    Lack of  Transportation (Non-Medical): No  Physical Activity: Inactive (10/25/2020)   Exercise Vital Sign    Days of Exercise per Week: 0 days    Minutes of Exercise per Session: 0 min  Stress: Stress Concern Present (10/25/2020)   [employer] of Occupational Health - Occupational Stress Questionnaire    Feeling of Stress : Very much  Social Connections: Socially Isolated (10/25/2020)   Social Connection and Isolation Panel [NHANES]    Frequency of Communication with Friends and Family: More than three times a week    Frequency of Social Gatherings with Friends and Family: Three times a week    Attends Religious Services: Never    Active Member of Clubs or Organizations: No    Attends Banker Meetings: Never    Marital Status: Divorced  Catering manager Violence: Not At Risk (10/25/2020)   Humiliation, Afraid, Rape, and Kick questionnaire    Fear of Current or Ex-Partner: No    Emotionally Abused: No    Physically Abused: No    Sexually Abused: No   Family Status  Relation Name Status   Mother  Deceased   Father  Deceased   Daughter Amy Ihor Dow   Daughter Eran Alive   Daughter Lyla Son Alive   MGF  Deceased   MGM  Deceased   PGF  Deceased   PGM  Deceased   Sister  (Not Specified)   Brother  (Not Specified)   Youth worker  (Not Specified)   Family History  Problem Relation Age of Onset   Heart disease Mother    Hyperlipidemia Mother    Hypertension Mother    Bipolar disorder Mother    Stroke Mother    Depression Mother    Sleep apnea Mother    Obesity Mother    Diabetes Father    Heart disease Father    Hyperlipidemia Father    Hypertension Father    Sleep apnea Father    Obesity Father    Drug abuse Daughter    ADD / ADHD Daughter    Drug abuse Daughter    Anxiety disorder Daughter    Bipolar disorder Daughter    Hypertension Sister    Hypertension Brother    Hyperlipidemia Brother    Heart disease Brother    Bipolar disorder Maternal Aunt     Suicidality Maternal Aunt    Allergies  Allergen Reactions   Aripiprazole Other (See Comments)    BECOMES  VIOLENT    Seroquel [Quetiapine Fumarate] Other (See  Comments)    BECOMES VIOLENT   Chlorpromazine Other (See Comments)    SEVERE ANXIETY    Gabapentin Other (See Comments)    NIGHTMARES    Prednisone    Quetiapine     Patient Care Team: Erasmo Downer, MD as PCP - General (Family Medicine)   Medications: Outpatient Medications Prior to Visit  Medication Sig   ALPRAZolam (XANAX) 1 MG tablet Take 1 tablet (1 mg total) by mouth 2 (two) times daily as needed. for anxiety   amphetamine-dextroamphetamine (ADDERALL) 30 MG tablet Take 1 tablet by mouth 2 (two) times daily.   amphetamine-dextroamphetamine (ADDERALL) 30 MG tablet Take 1 tablet by mouth 2 (two) times daily.   aspirin EC 81 MG tablet Take 81 mg by mouth daily.   aspirin-acetaminophen-caffeine (EXCEDRIN MIGRAINE) 250-250-65 MG tablet Take 2 tablets by mouth every 6 (six) hours as needed for headache.   atorvastatin (LIPITOR) 40 MG tablet TAKE 1 TABLET BY MOUTH EVERY DAY   cetirizine (ZYRTEC) 10 MG tablet TAKE 1 TABLET BY MOUTH DAILY   colchicine 0.6 MG tablet Take 1.2mg  (2 tablets) then 0.6mg  (1 tablet) 1 hour after. Then, take 1 tablet every day for 7 days.   famotidine (PEPCID) 20 MG tablet Take by mouth.   FLUoxetine (PROZAC) 20 MG capsule Take 1 capsule (20 mg total) by mouth daily. Take with 40 mg to equal 60 mg daily   FLUoxetine (PROZAC) 40 MG capsule Take 1 capsule (40 mg total) by mouth daily. Take with 20 mg to equal 60 mg daily   GARLIC PO Take 1 tablet by mouth daily.   hydrochlorothiazide (HYDRODIURIL) 25 MG tablet TAKE 1 TABLET BY MOUTH DAILY Please schedule office visit BEFORE any future refills   HYDROcodone-acetaminophen (NORCO) 10-325 MG tablet Take 1 tablet by mouth every 6 (six) hours as needed. To last 30 days from fill date   [START ON 11/26/2022] HYDROcodone-acetaminophen (NORCO) 10-325 MG  tablet Take 1 tablet by mouth every 6 (six) hours as needed. To last 30 days from fill date   hydrOXYzine (ATARAX) 25 MG tablet Take by mouth.   ketoconazole (NIZORAL) 2 % shampoo Apply topically daily.   levothyroxine (SYNTHROID) 125 MCG tablet Take 125 mcg by mouth daily before breakfast.   LINZESS 145 MCG CAPS capsule TAKE ONE CAPSULE BY MOUTH DAILY   lisinopril (ZESTRIL) 5 MG tablet TAKE 1 TABLET BY MOUTH DAILY   methocarbamol (ROBAXIN) 750 MG tablet Take 1 tablet (750 mg total) by mouth 2 (two) times daily as needed for muscle spasms.   Multiple Vitamin (MULTIVITAMIN) capsule Take 1 capsule by mouth daily.   omega-3 acid ethyl esters (LOVAZA) 1 g capsule Take by mouth.   Omega-3 Fatty Acids (OMEGA-3 FISH OIL PO) Take 1 capsule by mouth daily.   pantoprazole (PROTONIX) 40 MG tablet TAKE 1 TABLET BY MOUTH DAILY   polyethylene glycol powder (GLYCOLAX/MIRALAX) 17 GM/SCOOP powder Take 17 g by mouth daily.   potassium chloride (KLOR-CON M) 10 MEQ tablet TAKE 1 TABLET BY MOUTH THREE TIMES DAILY   pregabalin (LYRICA) 100 MG capsule TAKE ONE CAPSULE BY MOUTH FOUR TIMES DAILY   rOPINIRole (REQUIP) 2 MG tablet TAKE 1 TABLET BY MOUTH TWICE DAILY   Semaglutide, 1 MG/DOSE, 4 MG/3ML SOPN Inject 1 mg as directed once a week.   traZODone (DESYREL) 150 MG tablet Take 1 tablet (150 mg total) by mouth at bedtime.   valACYclovir (VALTREX) 1000 MG tablet Take 2 tablets by mouth at first onset of fever  blisters, then take 2 tablets by mouth 12 hours later.   Vibegron (GEMTESA) 75 MG TABS Take 75 mg by mouth daily.   warfarin (COUMADIN) 5 MG tablet TAKE 1 AND 1/2 TABLETS BY MOUTH ON TUEDAYS AND THURSDAYS, THEN TAKE 1 TABLET DAILY ON ALL OTHER DAYS (Patient taking differently: Take 5 mg by mouth daily.)   enoxaparin (LOVENOX) 100 MG/ML injection Inject 1 mL (100 mg total) into the skin every 12 (twelve) hours for 14 days. Start 7 days prior to colonoscopy and continue until we tell you to stop post-procedure when  INR is at goal. Hold morning of colonoscopy.   No facility-administered medications prior to visit.    Review of Systems  Constitutional:  Positive for fatigue.  Respiratory:  Positive for cough.   Musculoskeletal:  Positive for arthralgias, back pain, myalgias, neck pain and neck stiffness.  Neurological:  Positive for headaches.  All other systems reviewed and are negative.   Last CBC Lab Results  Component Value Date   WBC 6.5 10/01/2022   HGB 12.4 10/01/2022   HCT 37.8 10/01/2022   MCV 95.0 10/01/2022   MCH 31.2 10/01/2022   RDW 13.5 10/01/2022   PLT 136 (L) 10/01/2022   Last metabolic panel Lab Results  Component Value Date   GLUCOSE 104 (H) 10/01/2022   NA 138 10/01/2022   K 4.1 10/01/2022   CL 101 10/01/2022   CO2 29 10/01/2022   BUN 28 (H) 10/01/2022   CREATININE 1.30 (H) 10/01/2022   GFRNONAA 48 (L) 10/01/2022   CALCIUM 9.9 10/01/2022   PHOS 4.0 04/04/2019   PROT 7.4 10/01/2022   ALBUMIN 4.0 10/01/2022   LABGLOB 2.4 08/06/2022   AGRATIO 2.0 08/06/2022   BILITOT 0.4 10/01/2022   ALKPHOS 80 10/01/2022   AST 16 10/01/2022   ALT 15 10/01/2022   ANIONGAP 8 10/01/2022   Last lipids Lab Results  Component Value Date   CHOL 175 04/22/2022   HDL 38 (L) 04/22/2022   LDLCALC 95 04/22/2022   TRIG 248 (H) 04/22/2022   CHOLHDL 4.6 (H) 04/22/2022   Last hemoglobin [name] Lab Results  Component Value Date   HGBA1C 5.8 (A) 04/22/2022   Last thyroid functions Lab Results  Component Value Date   TSH 1.420 08/06/2022   T3TOTAL 100 05/18/2020   T4TOTAL 9.2 05/18/2020   Last vitamin D Lab Results  Component Value Date   VD25OH 47.10 02/21/2022   Last vitamin B12 and Folate Lab Results  Component Value Date   VITAMINB12 709 08/06/2022   FOLATE 40.5 02/21/2022      Objective    BP 103/66 (BP Location: Right Arm, Patient Position: Sitting, Cuff Size: Large)   Pulse (!) 59   Temp 97.9 F (36.6 C) (Oral)   Resp 16   Ht 5\' 10"  (1.778 m)   Wt 235 lb  9.6 oz (106.9 kg)   LMP  (LMP Unknown)   BMI 33.81 kg/m  BP Readings from Last 3 Encounters:  11/07/22 103/66  10/31/22 119/74  10/17/22 129/71   Wt Readings from Last 3 Encounters:  11/07/22 235 lb 9.6 oz (106.9 kg)  10/09/22 234 lb (106.1 kg)  09/17/22 237 lb (107.5 kg)      Physical Exam Vitals reviewed.  Constitutional:      General: She is not in acute distress.    Appearance: Normal appearance. She is well-developed. She is not diaphoretic.  HENT:     Head: Normocephalic and atraumatic.     Right Ear:  Tympanic membrane, ear canal and external ear normal.     Left Ear: Tympanic membrane, ear canal and external ear normal.     Nose: Nose normal.     Mouth/Throat:     Mouth: Mucous membranes are moist.     Pharynx: Oropharynx is clear. No oropharyngeal exudate.  Eyes:     General: No scleral icterus.    Conjunctiva/sclera: Conjunctivae normal.     Pupils: Pupils are equal, round, and reactive to light.  Neck:     Thyroid: No thyromegaly.  Cardiovascular:     Rate and Rhythm: Normal rate and regular rhythm.     Heart sounds: Normal heart sounds.  Pulmonary:     Effort: Pulmonary effort is normal. No respiratory distress.     Breath sounds: Normal breath sounds. No wheezing or rales.  Abdominal:     General: There is no distension.     Palpations: Abdomen is soft.     Tenderness: There is no abdominal tenderness.  Musculoskeletal:     Cervical back: Neck supple.     Right lower leg: No edema.     Left lower leg: No edema.     Comments: L shoulder ROM intact. L clavicle and L shoulder anterior TTP. L knee swollen.  Lymphadenopathy:     Cervical: No cervical adenopathy.  Skin:    General: Skin is warm and dry.     Findings: No rash.  Neurological:     Mental Status: She is alert and oriented to person, place, and time. Mental status is at baseline.     Gait: Gait abnormal (antalgic).  Psychiatric:        Mood and Affect: Mood normal.        Behavior:  Behavior normal.        Thought Content: Thought content normal.       Last depression screening scores    11/07/2022    9:20 AM 09/12/2022   11:06 AM 08/06/2022    1:52 PM  PHQ 2/9 Scores  PHQ - 2 Score 3 0 2  PHQ- 9 Score 10  9   Last fall risk screening    11/07/2022    9:19 AM  Fall Risk   Falls in the past year? 1  Number falls in past yr: 1  Injury with Fall? 1  Risk for fall due to : History of fall(s)  Follow up Falls evaluation completed;Education provided;Falls prevention discussed   Last Audit-C alcohol use screening    11/07/2022    9:20 AM  Alcohol Use Disorder Test (AUDIT)  1. How often do you have a drink containing alcohol? 0  2. How many drinks containing alcohol do you have on a typical day when you are drinking? 0  3. How often do you have six or more drinks on one occasion? 0  AUDIT-C Score 0   A score of 3 or more in women, and 4 or more in men indicates increased risk for alcohol abuse, EXCEPT if all of the points are from question 1   No results found for any visits on 11/07/22.  Assessment & Plan    Routine Health Maintenance and Physical Exam  Exercise Activities and Dietary recommendations  Goals   None     Immunization History  Administered Date(s) Administered   PNEUMOCOCCAL CONJUGATE-20 10/22/2021   Pneumococcal Polysaccharide-23 03/07/2020   Tdap 03/07/2020    Health Maintenance  Topic Date Due   COVID-19 Vaccine (1) Never done  OPHTHALMOLOGY EXAM  Never done   Zoster Vaccines- Shingrix (1 of 2) Never done   MAMMOGRAM  04/11/2022   Diabetic kidney evaluation - Urine ACR  11/06/2022   HEMOGLOBIN [name]  10/22/2022   INFLUENZA VACCINE  02/03/2023 (Originally 06/04/2022)   COLONOSCOPY (Pts 45-5yrs Insurance coverage will need to be confirmed)  11/21/2022   Diabetic kidney evaluation - eGFR measurement  10/02/2023   FOOT EXAM  11/08/2023   DTaP/Tdap/Td (2 - Td or Tdap) 03/07/2030   Hepatitis C Screening  Completed   HIV Screening   Completed   HPV VACCINES  Aged Out    Discussed health benefits of physical activity, and encouraged her to engage in regular exercise appropriate for her age and condition.  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)    Chronic and stable Recheck metabolic panel Avoid nephrotoxic meds       Diabetic peripheral neuropathy (HCC) (Chronic)    Chronic and stable No med changes      Hypothyroidism    Previously well controlled Continue Synthroid at current dose  Recheck TSH and adjust Synthroid as indicated        Relevant Orders   TSH   Type 2 diabetes mellitus with stage 3 chronic kidney disease, without long-term current use of insulin (HCC)    Well controlled with last [name] 5.8 Continue current medications UTD on vaccines, upcoming eye exam, foot exam per podiatry On ACEi/ARB On Statin Continue ozempic Repeat [name] and UACR Discussed diet and exercise F/u in 6 months       Relevant Orders   Urine Microalbumin w/creat. ratio   Hemoglobin [name]   Hyperlipidemia associated with type 2 diabetes mellitus (HCC)    Previously well controlled Continue statin Repeat FLP and CMP Goal LDL < 70      Relevant Orders   Lipid panel   Comprehensive metabolic panel     Other   Chronic anticoagulation (Coumadin) (Chronic)    Recheck INR Rx'd lovenox bridge for upcoming colonoscopy      Relevant  Orders   INR/PT   Analgesic rebound headache    Discussed cutting back on excedrin and caffeine use CPAP use will help with AM headaches      Fall    Will get imaging for affected joints Discussed fall precautions      Relevant Orders   DG Shoulder Left   DG Clavicle Left   DG Knee Complete 4 Views Left   Other Visit Diagnoses     Encounter for annual health examination    -  Primary   Relevant Orders   Urine Microalbumin w/creat. ratio   Lipid panel   Comprehensive metabolic panel   Hemoglobin [name]   INR/PT   TSH   Acute pain of left shoulder  Relevant Orders   DG Shoulder Left   DG Clavicle Left   Acute pain of left knee       Relevant Orders   DG Knee Complete 4 Views Left        Return in about 6 months (around 05/08/2023) for chronic disease f/u, INR 2/12.     I, Shirlee Latch, MD, have reviewed all documentation for this visit. The documentation on 11/07/22 for the exam, diagnosis, procedures, and orders are all accurate and complete.   Gerson Fauth, Marzella Schlein, MD, MPH [facility]

## 2022-11-01 NOTE — Progress Notes (Signed)
Virtual Visit via Video Note  I connected with Jane [name] on 11/01/22 at 11:40 AM EST by a video enabled telemedicine application and verified that I am speaking with the correct person using two identifiers.  Location: Patient: home Provider: office   I discussed the limitations of evaluation and management by telemedicine and the availability of in person appointments. The patient expressed understanding and agreed to proceed.     I discussed the assessment and treatment plan with the patient. The patient was provided an opportunity to ask questions and all were answered. The patient agreed with the plan and demonstrated an understanding of the instructions.   The patient was advised to call back or seek an in-person evaluation if the symptoms worsen or if the condition fails to improve as anticipated.  I provided  20 minutes of non-face-to-face time during this encounter.   [name], MD  [facility] MD/PA/NP OP Progress Note  11/01/2022 11:56 AM Jane [name]  MRN:  [id number]  Chief Complaint:  Chief Complaint  Patient presents with   ADD   Depression   Anxiety   Follow-up   HPI: This patient is a 56 year old divorced white female who lives alone in [place]. She is on disability due to to a bipolar disorder   The patient returns for follow-up after 2 months.  Overall she is doing okay.  She just got over a virus.  Her mood has been stable and she denies significant depression or anxiety.  She is not sleeping as well as she would like with trazodone so we will increase it a little bit.  She is trying to get a little bit more exercise.  To her credit she has lost 60 pounds on a low-carb diet.  She denies thoughts of self-harm or suicide.  She is focusing well with the Adderall Visit Diagnosis:    ICD-10-CM   1. Bipolar I disorder, most recent episode depressed ([place])  F31.30     2. Insomnia due to mental disorder  F51.05     3. Attention deficit hyperactivity disorder  (ADHD), predominantly inattentive type  F90.0       Past Psychiatric History: Long-term outpatient treatment  Past Medical History:  Past Medical History:  Diagnosis Date   ADD (attention deficit disorder)    Anxiety    Back pain    Bilateral swelling of feet    Bipolar 1 disorder (HCC)    Bipolar 1 disorder (HCC)    Bipolar disorder (HCC)    Chewing difficulty    Chronic fatigue syndrome    Chronic kidney disease    Stage 3 kidney disease;dx by Dr. Sinda Du.    Constipation    Depression    Diabetes mellitus without complication (HCC)    diet controlled   Dyspnea    with exertion   GERD (gastroesophageal reflux disease)    Headache    migraines   High cholesterol    History of blood clots    History of DVT (deep vein thrombosis)    left leg   Hypertension    states under control with meds., has been on med. x 2 years   Hypothyroidism    Joint pain    Neuropathy    Obsessive-compulsive disorder    Peripheral vascular disease ([place])    Prediabetes    Respiratory failure requiring intubation (HCC)    Restless leg syndrome    Shortness of breath    Sleep apnea    Thrombocytopenia ([place]) 09/15/2019  Trigger thumb of left hand 01/2018   Trigger thumb of right hand     Past Surgical History:  Procedure Laterality Date   ABDOMINAL HYSTERECTOMY  06/2016   complete   APPLICATION OF WOUND VAC Left 04/27/2019   Procedure: APPLICATION OF WOUND VAC LEFT GROIN;  Surgeon: Waynetta Sandy, MD;  Location: [place];  Service: Vascular;  Laterality: Left;   AV FISTULA PLACEMENT Left 11/24/2018   Procedure: ARTERIOVENOUS (AV) FISTULA CREATION LEFT SFA TO LEFT FEMORAL VEIN;  Surgeon: Waynetta Sandy, MD;  Location: [place];  Service: Vascular;  Laterality: Left;   BACK SURGERY     CHOLECYSTECTOMY     COLONOSCOPY WITH PROPOFOL N/A 11/22/2019   Procedure: COLONOSCOPY WITH PROPOFOL;  Surgeon: [name], MD;  Location: [facility];  Service: Gastroenterology;   Laterality: N/A;   FEMORAL ARTERY EXPLORATION  03/30/2019   Procedure: Left Common Femoral Artery and Vein Exploration;  Surgeon: Waynetta Sandy, MD;  Location: [place];  Service: Vascular;;   FEMORAL-FEMORAL BYPASS GRAFT Left 11/24/2018   Procedure: BYPASS GRAFT FEMORAL-FEMORAL VENOUS LEFT TO RIGHT PALMA PROCEDURE USING CRYOVEIN;  Surgeon: Waynetta Sandy, MD;  Location: [place];  Service: Vascular;  Laterality: Left;   GROIN DEBRIDEMENT Left 04/27/2019   Procedure: GROIN DEBRIDEMENT;  Surgeon: Waynetta Sandy, MD;  Location: [place];  Service: Vascular;  Laterality: Left;   INSERTION OF ILIAC STENT  03/30/2019   Procedure: Stent of left common, external iliac veins and left common femoral vein;  Surgeon: Waynetta Sandy, MD;  Location: [place];  Service: Vascular;;   KNEE ARTHROSCOPY WITH MENISCAL REPAIR Left 11/14/2020   Procedure: LEFT KNEE ARTHROSCOPY WITH PARTIAL MEDIAL MENISCECTOMY;  Surgeon: Carole Civil, MD;  Location: [facility];  Service: Orthopedics;  Laterality: Left;   LOWER EXTREMITY VENOGRAPHY N/A 08/17/2018   Procedure: LOWER EXTREMITY VENOGRAPHY - Central Venogram;  Surgeon: Waynetta Sandy, MD;  Location: [place] [facility];  Service: Cardiovascular;  Laterality: N/A;   LOWER EXTREMITY VENOGRAPHY Bilateral 03/09/2019   Procedure: LOWER EXTREMITY VENOGRAPHY;  Surgeon: Waynetta Sandy, MD;  Location: [place] [facility];  Service: Cardiovascular;  Laterality: Bilateral;   LOWER EXTREMITY VENOGRAPHY Left 08/16/2019   Procedure: LOWER EXTREMITY VENOGRAPHY;  Surgeon: Waynetta Sandy, MD;  Location: [place] [facility];  Service: Cardiovascular;  Laterality: Left;   LOWER EXTREMITY VENOGRAPHY Left 03/25/2022   Procedure: LOWER EXTREMITY VENOGRAPHY;  Surgeon: Waynetta Sandy, MD;  Location: [place] [facility];  Service: Cardiovascular;  Laterality: Left;  IVUS   LUMBAR FUSION  11/21/2000   L5-S1   LUMBAR SPINE SURGERY      x 2 others   PATCH ANGIOPLASTY Left 03/30/2019   Procedure: Patch Angioplasty of the Left Common Femoral Vein using Venosure Biologic patch;  Surgeon: Waynetta Sandy, MD;  Location: [place];  Service: Vascular;  Laterality: Left;   PERIPHERAL VASCULAR INTERVENTION Left 08/16/2019   Procedure: PERIPHERAL VASCULAR INTERVENTION;  Surgeon: Waynetta Sandy, MD;  Location: [place] [facility];  Service: Cardiovascular;  Laterality: Left;  common femoral/femoral vein stent   PERIPHERAL VASCULAR INTERVENTION Left 03/25/2022   Procedure: PERIPHERAL VASCULAR INTERVENTION;  Surgeon: Waynetta Sandy, MD;  Location: [place] [facility];  Service: Cardiovascular;  Laterality: Left;  COMMON FEMORAL VEIN   PERIPHERAL VASCULAR THROMBECTOMY Left 03/25/2022   Procedure: PERIPHERAL VASCULAR THROMBECTOMY;  Surgeon: Waynetta Sandy, MD;  Location: [place] [facility];  Service: Cardiovascular;  Laterality: Left;  EXTREMITY/IVC  TRIGGER FINGER RELEASE Right 12/01/2017   Procedure: RELEASE TRIGGER FINGER/A-1 PULLEY RIGHT THUMB;  Surgeon: Leanora Cover, MD;  Location: [place];  Service: Orthopedics;  Laterality: Right;   TRIGGER FINGER RELEASE Left 01/26/2018   Procedure: LEFT TRIGGER THUMB RELEASE;  Surgeon: Leanora Cover, MD;  Location: [place];  Service: Orthopedics;  Laterality: Left;   ULTRASOUND GUIDANCE FOR VASCULAR ACCESS Right 03/30/2019   Procedure: Ultrasound-guided cannulation right internal jugular vein;  Surgeon: Waynetta Sandy, MD;  Location: [place];  Service: Vascular;  Laterality: Right;    Family Psychiatric History: see below  Family History:  Family History  Problem Relation Age of Onset   Heart disease Mother    Hyperlipidemia Mother    Hypertension Mother    Bipolar disorder Mother    Stroke Mother    Depression Mother    Sleep apnea Mother    Obesity Mother    Diabetes Father    Heart disease Father     Hyperlipidemia Father    Hypertension Father    Sleep apnea Father    Obesity Father    Drug abuse Daughter    ADD / ADHD Daughter    Drug abuse Daughter    Anxiety disorder Daughter    Bipolar disorder Daughter    Hypertension Sister    Hypertension Brother    Hyperlipidemia Brother    Heart disease Brother    Bipolar disorder Maternal Aunt    Suicidality Maternal Aunt     Social History:  Social History   Socioeconomic History   Marital status: Divorced    Spouse name: Not on file   Number of children: 3   Years of education: Not on file   Highest education level: Not on file  Occupational History   Not on file  Tobacco Use   Smoking status: Never   Smokeless tobacco: Never  Vaping Use   Vaping Use: Never used  Substance and Sexual Activity   Alcohol use: No   Drug use: No   Sexual activity: Not Currently    Partners: Male    Birth control/protection: Surgical  Other Topics Concern   Not on file  Social History Narrative   Not on file   Social Determinants of Health   Financial Resource Strain: High Risk (10/25/2020)   Overall Financial Resource Strain (CARDIA)    Difficulty of Paying Living Expenses: Hard  Food Insecurity: Food Insecurity Present (05/27/2022)   Hunger Vital Sign    Worried About Running Out of Food in the Last Year: Sometimes true    Ran Out of Food in the Last Year: Sometimes true  Transportation Needs: No Transportation Needs (05/27/2022)   PRAPARE - Hydrologist (Medical): No    Lack of Transportation (Non-Medical): No  Physical Activity: Inactive (10/25/2020)   Exercise Vital Sign    Days of Exercise per Week: 0 days    Minutes of Exercise per Session: 0 min  Stress: Stress Concern Present (10/25/2020)   [place]    Feeling of Stress : Very much  Social Connections: Socially Isolated (10/25/2020)   Social Connection and Isolation Panel  [NHANES]    Frequency of Communication with Friends and Family: More than three times a week    Frequency of Social Gatherings with Friends and Family: Three times a week    Attends Religious Services: Never    Active Member of Clubs or Organizations:  No    Attends Archivist Meetings: Never    Marital Status: Divorced    Allergies:  Allergies  Allergen Reactions   Aripiprazole Other (See Comments)    BECOMES  VIOLENT    Seroquel [Quetiapine Fumarate] Other (See Comments)    BECOMES VIOLENT   Chlorpromazine Other (See Comments)    SEVERE ANXIETY    Gabapentin Other (See Comments)    NIGHTMARES    Prednisone    Quetiapine     Metabolic Disorder Labs: Lab Results  Component Value Date   HGBA1C 5.8 (A) 04/22/2022   MPG 131.24 11/06/2021   MPG 134.11 03/26/2019   No results found for: "PROLACTIN" Lab Results  Component Value Date   CHOL 175 04/22/2022   TRIG 248 (H) 04/22/2022   HDL 38 (L) 04/22/2022   CHOLHDL 4.6 (H) 04/22/2022   VLDL 37 11/06/2021   LDLCALC 95 04/22/2022   LDLCALC 116 (H) 11/06/2021   Lab Results  Component Value Date   TSH 1.420 08/06/2022   TSH 1.480 04/22/2022    Therapeutic Level Labs: No results found for: "LITHIUM" Lab Results  Component Value Date   VALPROATE 61.2 03/23/2014   No results found for: "CBMZ"  Current Medications: Current Outpatient Medications  Medication Sig Dispense Refill   traZODone (DESYREL) 150 MG tablet Take 1 tablet (150 mg total) by mouth at bedtime. 30 tablet 2   ALPRAZolam (XANAX) 1 MG tablet Take 1 tablet (1 mg total) by mouth 2 (two) times daily as needed. for anxiety 60 tablet 2   amphetamine-dextroamphetamine (ADDERALL) 30 MG tablet Take 1 tablet by mouth 2 (two) times daily. 60 tablet 0   amphetamine-dextroamphetamine (ADDERALL) 30 MG tablet Take 1 tablet by mouth 2 (two) times daily. 60 tablet 0   aspirin EC 81 MG tablet Take 81 mg by mouth daily.     aspirin-acetaminophen-caffeine  (EXCEDRIN MIGRAINE) 250-250-65 MG tablet Take 2 tablets by mouth every 6 (six) hours as needed for headache.     atorvastatin (LIPITOR) 40 MG tablet TAKE 1 TABLET BY MOUTH EVERY DAY 90 tablet 3   cetirizine (ZYRTEC) 10 MG tablet TAKE 1 TABLET BY MOUTH DAILY 90 tablet 1   colchicine 0.6 MG tablet Take 1.'2mg'$  (2 tablets) then 0.'6mg'$  (1 tablet) 1 hour after. Then, take 1 tablet every day for 7 days. 10 tablet 2   enoxaparin (LOVENOX) 100 MG/ML injection Inject 1 mL (100 mg total) into the skin every 12 (twelve) hours for 14 days. Start 7 days prior to colonoscopy and continue until we tell you to stop post-procedure when INR is at goal. Hold morning of colonoscopy. 28 mL 0   famotidine (PEPCID) 20 MG tablet Take by mouth.     FLUoxetine (PROZAC) 20 MG capsule Take 1 capsule (20 mg total) by mouth daily. Take with 40 mg to equal 60 mg daily 90 capsule 2   FLUoxetine (PROZAC) 40 MG capsule Take 1 capsule (40 mg total) by mouth daily. Take with 20 mg to equal 60 mg daily 90 capsule 2   GARLIC PO Take 1 tablet by mouth daily.     hydrochlorothiazide (HYDRODIURIL) 25 MG tablet TAKE 1 TABLET BY MOUTH DAILY Please schedule office visit BEFORE any future refills 90 tablet 1   HYDROcodone-acetaminophen (NORCO) 10-325 MG tablet Take 1 tablet by mouth every 6 (six) hours as needed. To last 30 days from fill date 120 tablet 0   [START ON 11/26/2022] HYDROcodone-acetaminophen (NORCO) 10-325 MG tablet Take 1 tablet  by mouth every 6 (six) hours as needed. To last 30 days from fill date 120 tablet 0   hydrOXYzine (ATARAX) 25 MG tablet Take by mouth.     ketoconazole (NIZORAL) 2 % shampoo Apply topically daily.     levothyroxine (SYNTHROID) 125 MCG tablet Take 125 mcg by mouth daily before breakfast.     LINZESS 145 MCG CAPS capsule TAKE ONE CAPSULE BY MOUTH DAILY 90 capsule 3   lisinopril (ZESTRIL) 5 MG tablet TAKE 1 TABLET BY MOUTH DAILY 90 tablet 1   methocarbamol (ROBAXIN) 750 MG tablet Take 1 tablet (750 mg total) by  mouth 2 (two) times daily as needed for muscle spasms. 60 tablet 5   Multiple Vitamin (MULTIVITAMIN) capsule Take 1 capsule by mouth daily.     omega-3 acid ethyl esters (LOVAZA) 1 g capsule Take by mouth.     Omega-3 Fatty Acids (OMEGA-3 FISH OIL PO) Take 1 capsule by mouth daily.     pantoprazole (PROTONIX) 40 MG tablet TAKE 1 TABLET BY MOUTH DAILY 90 tablet 3   polyethylene glycol powder (GLYCOLAX/MIRALAX) 17 GM/SCOOP powder Take 17 g by mouth daily. 850 g 1   potassium chloride (KLOR-CON M) 10 MEQ tablet TAKE 1 TABLET BY MOUTH THREE TIMES DAILY 270 tablet 1   pregabalin (LYRICA) 100 MG capsule TAKE ONE CAPSULE BY MOUTH FOUR TIMES DAILY 120 capsule 1   rOPINIRole (REQUIP) 2 MG tablet TAKE 1 TABLET BY MOUTH TWICE DAILY 180 tablet 3   Semaglutide, 1 MG/DOSE, 4 MG/3ML SOPN Inject 1 mg as directed once a week. 9 mL 0   valACYclovir (VALTREX) 1000 MG tablet Take 2 tablets by mouth at first onset of fever blisters, then take 2 tablets by mouth 12 hours later. 30 tablet 3   Vibegron (GEMTESA) 75 MG TABS Take 75 mg by mouth daily. 90 tablet 3   warfarin (COUMADIN) 5 MG tablet TAKE 1 AND 1/2 TABLETS BY MOUTH ON TUEDAYS AND THURSDAYS, THEN TAKE 1 TABLET DAILY ON ALL OTHER DAYS (Patient taking differently: Take 5 mg by mouth daily.) 60 tablet 3   No current facility-administered medications for this visit.     Musculoskeletal: Strength & Muscle Tone: within normal limits Gait & Station: normal Patient leans: N/A  Psychiatric Specialty Exam: Review of Systems  Musculoskeletal:  Positive for arthralgias.  Psychiatric/Behavioral:  Positive for sleep disturbance.   All other systems reviewed and are negative.   There were no vitals taken for this visit.There is no height or weight on file to calculate BMI.  General Appearance: Casual and Fairly Groomed  Eye Contact:  Good  Speech:  Clear and Coherent  Volume:  Normal  Mood:  Euthymic  Affect:  Congruent  Thought Process:  Goal Directed   Orientation:  Full (Time, Place, and Person)  Thought Content: WDL   Suicidal Thoughts:  No  Homicidal Thoughts:  No  Memory:  Immediate;   Good Recent;   Good Remote;   Good  Judgement:  Good  Insight:  Good  Psychomotor Activity:  Decreased  Concentration:  Concentration: Good and Attention Span: Good  Recall:  Good  Fund of Knowledge: Good  Language: Good  Akathisia:  No  Handed:  Right  AIMS (if indicated): not done  Assets:  Communication Skills Desire for Improvement Resilience Social Support Talents/Skills  ADL's:  Intact  Cognition: WNL  Sleep:  Poor   Screenings: Camera operator Row Office Visit from 09/12/2022 in [place]  [facility] Visit from 08/06/2022 in [facility] Video Visit from 07/01/2022 in [place] Office Visit from 06/20/2022 in [place] Office Visit from 06/11/2022 in [place]  PHQ-2 Total Score 0 2 4 0 4  PHQ-9 Total Score -- 9 10 -- 16      [facility] from 07/14/2022 in [place] Video Visit from 07/01/2022 in [facility] Video Visit from 04/08/2022 in [place] No Risk No Risk No Risk        Assessment and Plan: This patient is a 56 year old female with a history of depression anxiety and ADD.  She is not sleeping as well as she would like so we will increase trazodone to 150 mg at bedtime.  She will continue Prozac 60 mg daily for depression, Xanax 1 mg twice daily as needed for anxiety and Adderall 30 mg twice daily for ADD.  She will return to see me in 2 months  Collaboration of Care: Collaboration of Care: Primary Care Provider AEB notes are available to PCP through the epic system  Patient/Guardian was advised Release of Information must be  obtained prior to any record release in order to collaborate their care with an outside provider. Patient/Guardian was advised if they have not already done so to contact the registration department to sign all necessary forms in order for [place] to release information regarding their care.   Consent: Patient/Guardian gives verbal consent for treatment and assignment of benefits for services provided during this visit. Patient/Guardian expressed understanding and agreed to proceed.    [name], MD 11/01/2022, 11:56 AM

## 2022-11-05 ENCOUNTER — Ambulatory Visit: Payer: Medicaid Other | Attending: Family Medicine | Admitting: Pulmonary Disease

## 2022-11-05 DIAGNOSIS — G4736 Sleep related hypoventilation in conditions classified elsewhere: Secondary | ICD-10-CM | POA: Insufficient documentation

## 2022-11-05 DIAGNOSIS — G4733 Obstructive sleep apnea (adult) (pediatric): Secondary | ICD-10-CM | POA: Diagnosis present

## 2022-11-05 DIAGNOSIS — G4734 Idiopathic sleep related nonobstructive alveolar hypoventilation: Secondary | ICD-10-CM | POA: Diagnosis not present

## 2022-11-05 DIAGNOSIS — R0683 Snoring: Secondary | ICD-10-CM | POA: Diagnosis not present

## 2022-11-06 ENCOUNTER — Other Ambulatory Visit: Payer: Self-pay | Admitting: Unknown Physician Specialty

## 2022-11-06 ENCOUNTER — Ambulatory Visit: Payer: Medicaid Other

## 2022-11-06 DIAGNOSIS — H93A1 Pulsatile tinnitus, right ear: Secondary | ICD-10-CM

## 2022-11-07 ENCOUNTER — Ambulatory Visit (INDEPENDENT_AMBULATORY_CARE_PROVIDER_SITE_OTHER): Payer: Medicaid Other | Admitting: Family Medicine

## 2022-11-07 ENCOUNTER — Other Ambulatory Visit: Payer: Self-pay | Admitting: Family Medicine

## 2022-11-07 ENCOUNTER — Encounter: Payer: Self-pay | Admitting: Family Medicine

## 2022-11-07 ENCOUNTER — Ambulatory Visit
Admission: RE | Admit: 2022-11-07 | Discharge: 2022-11-07 | Disposition: A | Discharge status: Home / Self Care | Payer: Medicaid Other | Source: Ambulatory Visit | Attending: Family Medicine | Admitting: Family Medicine

## 2022-11-07 VITALS — BP 103/66 | HR 59 | Temp 97.9°F | Resp 16 | Ht 70.0 in | Wt 235.6 lb

## 2022-11-07 DIAGNOSIS — E038 Other specified hypothyroidism: Secondary | ICD-10-CM

## 2022-11-07 DIAGNOSIS — M25512 Pain in left shoulder: Secondary | ICD-10-CM | POA: Insufficient documentation

## 2022-11-07 DIAGNOSIS — N1831 Chronic kidney disease, stage 3a: Secondary | ICD-10-CM

## 2022-11-07 DIAGNOSIS — G4440 Drug-induced headache, not elsewhere classified, not intractable: Secondary | ICD-10-CM

## 2022-11-07 DIAGNOSIS — M1712 Unilateral primary osteoarthritis, left knee: Secondary | ICD-10-CM | POA: Insufficient documentation

## 2022-11-07 DIAGNOSIS — E1142 Type 2 diabetes mellitus with diabetic polyneuropathy: Secondary | ICD-10-CM

## 2022-11-07 DIAGNOSIS — T3995XA Adverse effect of unspecified nonopioid analgesic, antipyretic and antirheumatic, initial encounter: Secondary | ICD-10-CM

## 2022-11-07 DIAGNOSIS — E1122 Type 2 diabetes mellitus with diabetic chronic kidney disease: Secondary | ICD-10-CM

## 2022-11-07 DIAGNOSIS — W19XXXA Unspecified fall, initial encounter: Secondary | ICD-10-CM | POA: Insufficient documentation

## 2022-11-07 DIAGNOSIS — I152 Hypertension secondary to endocrine disorders: Secondary | ICD-10-CM

## 2022-11-07 DIAGNOSIS — M25562 Pain in left knee: Secondary | ICD-10-CM | POA: Insufficient documentation

## 2022-11-07 DIAGNOSIS — M19012 Primary osteoarthritis, left shoulder: Secondary | ICD-10-CM | POA: Diagnosis not present

## 2022-11-07 DIAGNOSIS — E1169 Type 2 diabetes mellitus with other specified complication: Secondary | ICD-10-CM

## 2022-11-07 DIAGNOSIS — E1159 Type 2 diabetes mellitus with other circulatory complications: Secondary | ICD-10-CM

## 2022-11-07 DIAGNOSIS — G4733 Obstructive sleep apnea (adult) (pediatric): Secondary | ICD-10-CM | POA: Insufficient documentation

## 2022-11-07 DIAGNOSIS — M792 Neuralgia and neuritis, unspecified: Secondary | ICD-10-CM

## 2022-11-07 DIAGNOSIS — Z0000 Encounter for general adult medical examination without abnormal findings: Secondary | ICD-10-CM | POA: Diagnosis not present

## 2022-11-07 DIAGNOSIS — E785 Hyperlipidemia, unspecified: Secondary | ICD-10-CM

## 2022-11-07 DIAGNOSIS — R058 Other specified cough: Secondary | ICD-10-CM | POA: Insufficient documentation

## 2022-11-07 DIAGNOSIS — Z7901 Long term (current) use of anticoagulants: Secondary | ICD-10-CM

## 2022-11-07 DIAGNOSIS — N183 Chronic kidney disease, stage 3 unspecified: Secondary | ICD-10-CM

## 2022-11-07 NOTE — Assessment & Plan Note (Signed)
Discussed cutting back on excedrin and caffeine use CPAP use will help with AM headaches

## 2022-11-07 NOTE — Assessment & Plan Note (Signed)
Chronic and stable No med changes

## 2022-11-07 NOTE — Procedures (Signed)
Patient Name: Trenton, Verne Date: 11/05/2022 Gender: Female D.O.B: [birth year] Age (years): 74 Referring Provider: Kara Mead MD, ABSM Height (inches): 70 Interpreting Physician: Kara Mead MD, ABSM Weight (lbs): 234 RPSGT: Rosebud Poles BMI: 34 MRN: [id number] Neck Size: 17.00 <br> <br> CLINICAL INFORMATION Sleep Study Type: NPSG    Indication for sleep study: mornig headaches,reassessment of OSA    Epworth Sleepiness Score: 21     Most recent polysomnogram dated 07/26/2019 revealed an AHI of 50.2/h and RDI of 50.2/h. Most recent titration study dated 07/26/2019 was optimal at 14cm H2O with an AHI of 9.3/h. SLEEP STUDY TECHNIQUE As per the AASM Manual for the Scoring of Sleep and Associated Events v2.3 (April 2016) with a hypopnea requiring 4% desaturations.  The channels recorded and monitored were frontal, central and occipital EEG, electrooculogram (EOG), submentalis EMG (chin), nasal and oral airflow, thoracic and abdominal wall motion, anterior tibialis EMG, snore microphone, electrocardiogram, and pulse oximetry.  MEDICATIONS Medications self-administered by patient taken the night of the study : N/A  SLEEP ARCHITECTURE The study was initiated at 9:47:37 PM and ended at 4:48:09 AM.  Sleep onset time was 83.7 minutes and the sleep efficiency was 68.1%. The total sleep time was 286.3 minutes.  Stage REM latency was 188.0 minutes.  The patient spent 2.27% of the night in stage N1 sleep, 55.29% in stage N2 sleep, 26.20% in stage N3 and 16.2% in REM.  Alpha intrusion was absent.  Supine sleep was 48.59%.  RESPIRATORY PARAMETERS The overall apnea/hypopnea index (AHI) was 14.3 per hour. There were 0 total apneas, including 0 obstructive, 0 central and 0 mixed apneas. There were 68 hypopneas and 0 RERAs.  The AHI during Stage REM sleep was 20.6 per hour.  AHI while supine was 18.5 per hour.  The mean oxygen saturation was 88.61%. The minimum SpO2 during  sleep was 82.00%.  loud snoring was noted during this study.  CARDIAC DATA The 2 lead EKG demonstrated sinus rhythm. The mean heart rate was 62.13 beats per minute. Other EKG findings include: None.  LEG MOVEMENT DATA The total PLMS were 142 with a resulting PLMS index of 29.76. Associated arousal with leg movement index was 4.4 .  IMPRESSIONS - Mild obstructive sleep apnea occurred during this study (AHI = 14.3/h). Events were mainly noted during supine sleep - Mild oxygen desaturation was noted during this study (Min O2 = 82.00%). - The patient snored with loud snoring volume. - No cardiac abnormalities were noted during this study. - Moderate periodic limb movements of sleep occurred during the study. No significant associated arousals.   DIAGNOSIS - Obstructive Sleep Apnea (G47.33) , mild on this study previously noted to be severe, predominantly during supine sleep - Nocturnal Hypoxemia (G47.36)   RECOMMENDATIONS - Therapeutic CPAP titration to determine optimal pressure required to alleviate sleep disordered breathing. Alternatively, autoCPAP 10-15 cm can be used based on her needing 14 cm or pressure during the prior study from 2020. - Avoid alcohol, sedatives and other CNS depressants that may worsen sleep apnea and disrupt normal sleep architecture. - Sleep hygiene should be reviewed to assess factors that may improve sleep quality. - Weight management and regular exercise should be initiated or continued if appropriate.   Kara Mead MD Board Certified in [place]

## 2022-11-07 NOTE — Assessment & Plan Note (Signed)
Well controlled Continue current medications Recheck metabolic panel F/u in 6 months  

## 2022-11-07 NOTE — Assessment & Plan Note (Signed)
Chronic and stable Recheck metabolic panel Avoid nephrotoxic meds  

## 2022-11-07 NOTE — Assessment & Plan Note (Signed)
Discussed natural course and symptomatic management Lungs clear today

## 2022-11-07 NOTE — Assessment & Plan Note (Signed)
Previously well controlled Continue statin Repeat FLP and CMP Goal LDL < 70 

## 2022-11-07 NOTE — Telephone Encounter (Signed)
Requested medication (s) are due for refill today - yes  Requested medication (s) are on the active medication list -yes  Future visit scheduled -no  Last refill: 09/12/22 #120 1RF  Notes to clinic: non delegated Rx  Requested Prescriptions  Pending Prescriptions Disp Refills   pregabalin (LYRICA) 100 MG capsule [Pharmacy Med Name: pregabalin 100 mg capsule] 120 capsule 1    Sig: TAKE ONE CAPSULE BY MOUTH FOUR TIMES DAILY     Not Delegated - Neurology:  Anticonvulsants - Controlled - pregabalin Failed - 11/07/2022  8:07 AM      Failed - This refill cannot be delegated      Failed - Cr in normal range and within 360 days    Creatinine, Ser  Date Value Ref Range Status  10/01/2022 1.30 (H) 0.44 - 1.00 mg/dL Final         Passed - Completed PHQ-2 or PHQ-9 in the last 360 days      Passed - Valid encounter within last 12 months    Recent Outpatient Visits           Today Encounter for annual health examination   [facility] [place], Dionne Bucy, MD   3 weeks ago Upper respiratory tract infection, unspecified type   [facility] Thedore Mins, [place], PA-C   2 months ago Tinnitus of right ear   [facility] [place], Dionne Bucy, MD   3 months ago Other fatigue   [facility] Myles Gip, DO   4 months ago Ear pain, left   [employer], [place], PA-C       Future Appointments             In 4 days Brendolyn Patty, MD [place]   In 2 months McGowan, Gordan Payment [facility]               Requested Prescriptions  Pending Prescriptions Disp Refills   pregabalin (LYRICA) 100 MG capsule [Pharmacy Med Name: pregabalin 100 mg capsule] 120 capsule 1    Sig: TAKE ONE CAPSULE BY MOUTH FOUR TIMES DAILY     Not Delegated - Neurology:  Anticonvulsants - Controlled - pregabalin Failed - 11/07/2022  8:07 AM      Failed - This refill cannot be delegated      Failed - Cr  in normal range and within 360 days    Creatinine, Ser  Date Value Ref Range Status  10/01/2022 1.30 (H) 0.44 - 1.00 mg/dL Final         Passed - Completed PHQ-2 or PHQ-9 in the last 360 days      Passed - Valid encounter within last 12 months    Recent Outpatient Visits           Today Encounter for annual health examination   [facility] [place], Dionne Bucy, MD   3 weeks ago Upper respiratory tract infection, unspecified type   [facility] Thedore Mins, [place], PA-C   2 months ago Tinnitus of right ear   [facility] [place], Dionne Bucy, MD   3 months ago Other fatigue   [facility] Myles Gip, DO   4 months ago Ear pain, left   [employer], [place], PA-C       Future Appointments             In 4 days Brendolyn Patty, MD [place]   In 2  months McGowan, Gordan Payment [place]

## 2022-11-07 NOTE — Assessment & Plan Note (Signed)
Will get imaging for affected joints Discussed fall precautions

## 2022-11-07 NOTE — Assessment & Plan Note (Signed)
Reviewed sleep study from 1/2 with AHI 14.3 Needs CPAP Very symptomatic Will send Rx for CPAP through [facility]

## 2022-11-07 NOTE — Assessment & Plan Note (Signed)
Well controlled with last [name] 5.8 Continue current medications UTD on vaccines, upcoming eye exam, foot exam per podiatry On ACEi/ARB On Statin Continue ozempic Repeat [name] and UACR Discussed diet and exercise F/u in 6 months

## 2022-11-07 NOTE — Assessment & Plan Note (Signed)
Recheck INR Rx'd lovenox bridge for upcoming colonoscopy

## 2022-11-07 NOTE — Assessment & Plan Note (Signed)
Previously well controlled Continue Synthroid at current dose  Recheck TSH and adjust Synthroid as indicated   

## 2022-11-08 LAB — HEMOGLOBIN A1C
Est. average glucose Bld gHb Est-mCnc: 123 mg/dL
Hgb A1c MFr Bld: 5.9 % — ABNORMAL HIGH (ref 4.8–5.6)

## 2022-11-08 LAB — COMPREHENSIVE METABOLIC PANEL
ALT: 16 IU/L (ref 0–32)
AST: 12 IU/L (ref 0–40)
Albumin/Globulin Ratio: 1.8 (ref 1.2–2.2)
Albumin: 4.6 g/dL (ref 3.8–4.9)
Alkaline Phosphatase: 102 IU/L (ref 44–121)
BUN/Creatinine Ratio: 24 — ABNORMAL HIGH (ref 9–23)
BUN: 33 mg/dL — ABNORMAL HIGH (ref 6–24)
Bilirubin Total: 0.3 mg/dL (ref 0.0–1.2)
CO2: 23 mmol/L (ref 20–29)
Calcium: 9.7 mg/dL (ref 8.7–10.2)
Chloride: 104 mmol/L (ref 96–106)
Creatinine, Ser: 1.4 mg/dL — ABNORMAL HIGH (ref 0.57–1.00)
Globulin, Total: 2.6 g/dL (ref 1.5–4.5)
Glucose: 97 mg/dL (ref 70–99)
Potassium: 4.3 mmol/L (ref 3.5–5.2)
Sodium: 143 mmol/L (ref 134–144)
Total Protein: 7.2 g/dL (ref 6.0–8.5)
eGFR: 44 mL/min/{1.73_m2} — ABNORMAL LOW (ref >59)

## 2022-11-08 LAB — LIPID PANEL
Chol/HDL Ratio: 5.7 ratio — ABNORMAL HIGH (ref 0.0–4.4)
Cholesterol, Total: 187 mg/dL (ref 100–199)
HDL: 33 mg/dL — ABNORMAL LOW (ref >39)
LDL Chol Calc (NIH): 105 mg/dL — ABNORMAL HIGH (ref 0–99)
Triglycerides: 285 mg/dL — ABNORMAL HIGH (ref 0–149)
VLDL Cholesterol Cal: 49 mg/dL — ABNORMAL HIGH (ref 5–40)

## 2022-11-08 LAB — TSH: TSH: 1.44 u[IU]/mL (ref 0.450–4.500)

## 2022-11-08 LAB — MICROALBUMIN / CREATININE URINE RATIO
Creatinine, Urine: 69.6 mg/dL
Microalb/Creat Ratio: 6 mg/g creat (ref 0–29)
Microalbumin, Urine: 3.9 ug/mL

## 2022-11-08 LAB — PROTIME-INR
INR: 1.1 (ref 0.9–1.2)
Prothrombin Time: 12 s (ref 9.1–12.0)

## 2022-11-08 NOTE — Telephone Encounter (Signed)
Pt called requesting the numbers on her stents for imaging purposes.  Implant list copied, pasted, and sent to pt via Mychart msg.

## 2022-11-11 ENCOUNTER — Ambulatory Visit: Payer: Medicaid Other | Admitting: Dermatology

## 2022-11-11 ENCOUNTER — Other Ambulatory Visit (HOSPITAL_COMMUNITY): Payer: Self-pay

## 2022-11-11 VITALS — BP 101/64

## 2022-11-11 DIAGNOSIS — D225 Melanocytic nevi of trunk: Secondary | ICD-10-CM | POA: Diagnosis not present

## 2022-11-11 DIAGNOSIS — L578 Other skin changes due to chronic exposure to nonionizing radiation: Secondary | ICD-10-CM

## 2022-11-11 DIAGNOSIS — Z1283 Encounter for screening for malignant neoplasm of skin: Secondary | ICD-10-CM | POA: Diagnosis not present

## 2022-11-11 DIAGNOSIS — I872 Venous insufficiency (chronic) (peripheral): Secondary | ICD-10-CM | POA: Diagnosis not present

## 2022-11-11 DIAGNOSIS — L821 Other seborrheic keratosis: Secondary | ICD-10-CM

## 2022-11-11 DIAGNOSIS — L814 Other melanin hyperpigmentation: Secondary | ICD-10-CM

## 2022-11-11 DIAGNOSIS — D229 Melanocytic nevi, unspecified: Secondary | ICD-10-CM

## 2022-11-11 MED ORDER — TRIAMCINOLONE ACETONIDE 0.1 % EX CREA: TOPICAL_CREAM | CUTANEOUS | 2 refills | Status: DC

## 2022-11-11 NOTE — Progress Notes (Signed)
Follow-Up Visit   Subjective  Jane [name] is a 57 y.o. female who presents for the following: Annual Exam.  The patient presents for Total-Body Skin Exam (TBSE) for skin cancer screening and mole check.  The patient has spots, moles and lesions to be evaluated, some may be new or changing. Patient has been seen by a dermatologist in the past for skin check. Will sign records release form to get history. No history of skin cancer, but history of precancerous spot removed in past.  She has a history of rash on the lower legs and uses triamcinolone 0.1% cream twice daily.    The following portions of the chart were reviewed this encounter and updated as appropriate:       Review of Systems:  No other skin or systemic complaints except as noted in HPI or Assessment and Plan.  Objective  Well appearing patient in no apparent distress; mood and affect are within normal limits.  A full examination was performed including scalp, head, eyes, ears, nose, lips, neck, chest, axillae, abdomen, back, buttocks, bilateral upper extremities, bilateral lower extremities, hands, feet, fingers, toes, fingernails, and toenails. All findings within normal limits unless otherwise noted below.  R posterior flank 5 x 76m brown macule   R lower back 2.517mmed dark brown macule  lower legs Hyperpigmentation of the lower legs and small varicosities; trace edema    Assessment & Plan  Skin cancer screening performed today.  Actinic Damage - chronic, secondary to cumulative UV radiation exposure/sun exposure over time - diffuse scaly erythematous macules with underlying dyspigmentation - Recommend daily broad spectrum sunscreen SPF 30+ to sun-exposed areas, reapply every 2 hours as needed.  - Recommend staying in the shade or wearing long sleeves, sun glasses (UVA+UVB protection) and wide brim hats (4-inch brim around the entire circumference of the hat). - Call for new or changing  lesions.  Lentigines - Scattered tan macules - Due to sun exposure - Benign-appearing, observe - Recommend daily broad spectrum sunscreen SPF 30+ to sun-exposed areas, reapply every 2 hours as needed. - Call for any changes  Melanocytic Nevi - Tan-brown and/or pink-flesh-colored symmetric macules and papules - Benign appearing on exam today - Observation - Call clinic for new or changing moles - Recommend daily use of broad spectrum spf 30+ sunscreen to sun-exposed areas.   Seborrheic Keratoses - Stuck-on, waxy, tan-brown papules and/or plaques  - Benign-appearing - Discussed benign etiology and prognosis. - Observe - Call for any changes  Hemangiomas - Red papules - Discussed benign nature - Observe - Call for any changes  Nevus (2) R posterior flank; R lower back  Benign-appearing.  Observation.  Call clinic for new or changing moles.  Recommend daily use of broad spectrum spf 30+ sunscreen to sun-exposed areas.   Stasis dermatitis of both legs lower legs  Chronic and persistent condition with duration or expected duration over one year. Condition is symptomatic/ bothersome to patient. Not currently at goal.   Stasis in the legs causes chronic leg swelling, which may result in itchy or painful rashes, skin discoloration, skin texture changes, and sometimes ulceration.  Recommend daily graduated compression hose/stockings- easiest to put on first thing in morning, remove at bedtime.  Elevate legs as much as possible. Avoid salt/sodium rich foods.  Continue TMC 0.1% Cream but use only as needed QD/BID, Avoid face, groin, axilla. Caution skin atrophy with long-term use.  Topical steroids (such as triamcinolone, fluocinolone, fluocinonide, mometasone, clobetasol, halobetasol, betamethasone, hydrocortisone) can cause  thinning and lightening of the skin if they are used for too long in the same area. Your physician has selected the right strength medicine for your problem and  area affected on the body. Please use your medication only as directed by your physician to prevent side effects.    triamcinolone cream (KENALOG) 0.1 % - lower legs Apply to rash on lower legs once to twice daily as needed. Avoid face, groin, axilla.   Return in about 1 year (around 11/12/2023) for TBSE.  IJamesetta Orleans, CMA, am acting as scribe for Brendolyn Patty, MD .  Documentation: I have reviewed the above documentation for accuracy and completeness, and I agree with the above.  Brendolyn Patty MD

## 2022-11-11 NOTE — Patient Instructions (Addendum)
Stasis in the legs causes chronic leg swelling, which may result in itchy or painful rashes, skin discoloration, skin texture changes, and sometimes ulceration.  Recommend daily graduated compression hose/stockings- easiest to put on first thing in morning, remove at bedtime.  Elevate legs as much as possible. Avoid salt/sodium rich foods.  Continue triamcinolone 0.1% cream - Apply to affected areas rash on legs once to twice daily until rash improved. Avoid face, groin, underarms. Topical steroids (such as triamcinolone, fluocinolone, fluocinonide, mometasone, clobetasol, halobetasol, betamethasone, hydrocortisone) can cause thinning and lightening of the skin if they are used for too long in the same area. Your physician has selected the right strength medicine for your problem and area affected on the body. Please use your medication only as directed by your physician to prevent side effects.    Melanoma ABCDEs  Melanoma is the most dangerous type of skin cancer, and is the leading cause of death from skin disease.  You are more likely to develop melanoma if you: Have light-colored skin, light-colored eyes, or red or blond hair Spend a lot of time in the sun Tan regularly, either outdoors or in a tanning bed Have had blistering sunburns, especially during childhood Have a close family member who has had a melanoma Have atypical moles or large birthmarks  Early detection of melanoma is key since treatment is typically straightforward and cure rates are extremely high if we catch it early.   The first sign of melanoma is often a change in a mole or a new dark spot.  The ABCDE system is a way of remembering the signs of melanoma.  A for asymmetry:  The two halves do not match. B for border:  The edges of the growth are irregular. C for color:  A mixture of colors are present instead of an even brown color. D for diameter:  Melanomas are usually (but not always) greater than 43m - the size of a  pencil eraser. E for evolution:  The spot keeps changing in size, shape, and color.  Please check your skin once per month between visits. You can use a small mirror in front and a large mirror behind you to keep an eye on the back side or your body.   If you see any new or changing lesions before your next follow-up, please call to schedule a visit.  Please continue daily skin protection including broad spectrum sunscreen SPF 30+ to sun-exposed areas, reapplying every 2 hours as needed when you're outdoors.   Staying in the shade or wearing long sleeves, sun glasses (UVA+UVB protection) and wide brim hats (4-inch brim around the entire circumference of the hat) are also recommended for sun protection.    Due to recent changes in healthcare laws, you may see results of your pathology and/or laboratory studies on MyChart before the doctors have had a chance to review them. We understand that in some cases there may be results that are confusing or concerning to you. Please understand that not all results are received at the same time and often the doctors may need to interpret multiple results in order to provide you with the best plan of care or course of treatment. Therefore, we ask that you please give u[place]2 business days to thoroughly review all your results before contacting the office for clarification. Should we see a critical lab result, you will be contacted sooner.   If You Need Anything After Your Visit  If you have any questions or concerns  for your doctor, please call our main line at [phone] and press option 4 to reach your doctor's medical assistant. If no one answers, please leave a voicemail as directed and we will return your call as soon as possible. Messages left after 4 pm will be answered the following business day.   You may also send [place] a message via [place]. We typically respond to MyChart messages within 1-2 business days.  For prescription refills, please ask your  pharmacy to contact our office. Our fax number is [phone].  If you have an urgent issue when the clinic is closed that cannot wait until the next business day, you can page your doctor at the number below.    Please note that while we do our best to be available for urgent issues outside of office hours, we are not available 24/7.   If you have an urgent issue and are unable to reach [place], you may choose to seek medical care at your doctor's office, retail clinic, urgent care center, or emergency room.  If you have a medical emergency, please immediately call 911 or go to the emergency department.  Pager Numbers  - Dr. Nehemiah Massed: [phone]  - Dr. Laurence Ferrari: [phone]  - Dr. Nicole Kindred: [phone]  In the event of inclement weather, please call our main line at [phone] for an update on the status of any delays or closures.  Dermatology Medication Tips: Please keep the boxes that topical medications come in in order to help keep track of the instructions about where and how to use these. Pharmacies typically print the medication instructions only on the boxes and not directly on the medication tubes.   If your medication is too expensive, please contact our office at [phone] option 4 or send [place] a message through [place].   We are unable to tell what your co-pay for medications will be in advance as this is different depending on your insurance coverage. However, we may be able to find a substitute medication at lower cost or fill out paperwork to get insurance to cover a needed medication.   If a prior authorization is required to get your medication covered by your insurance company, please allow [place] 1-2 business days to complete this process.  Drug prices often vary depending on where the prescription is filled and some pharmacies may offer cheaper prices.  The website www.goodrx.com contains coupons for medications through different pharmacies. The prices here do not  account for what the cost may be with help from insurance (it may be cheaper with your insurance), but the website can give you the price if you did not use any insurance.  - You can print the associated coupon and take it with your prescription to the pharmacy.  - You may also stop by our office during regular business hours and pick up a GoodRx coupon card.  - If you need your prescription sent electronically to a different pharmacy, notify our office through [facility] or by phone at [phone] option 4.     Si Usted Necesita Algo Despus de Su Visita  Tambin puede enviarnos un mensaje a travs de Pharmacist, community. Por lo general respondemos a los mensajes de MyChart en el transcurso de 1 a 2 das hbiles.  Para renovar recetas, por favor pida a su farmacia que se ponga en contacto con nuestra oficina. Harland Dingwall de fax es [place] [phone].  Si tiene un asunto urgente cuando la clnica est cerrada y que no puede esperar Nurse, children's  el siguiente da hbil, puede llamar/localizar a su doctor(a) al nmero que aparece a continuacin.   Por favor, tenga en cuenta que aunque hacemos todo lo posible para estar disponibles para asuntos urgentes fuera del horario de [place], no estamos disponibles las 24 horas del da, los 7 das de la [place].   Si tiene un problema urgente y no puede comunicarse con nosotros, puede optar por buscar atencin mdica  en el consultorio de su doctor(a), en una clnica privada, en un centro de atencin urgente o en una sala de emergencias.  Si tiene Engineering geologist, por favor llame inmediatamente al 911 o vaya a la sala de emergencias.  Nmeros de bper  - Dr. Nehemiah Massed: [phone]  - Dra. Moye: [phone]  - Dra. Nicole Kindred: [phone]  En caso de inclemencias del [place], por favor llame a Johnsie Kindred principal al [phone] para una actualizacin sobre el [place] de cualquier retraso o cierre.  Consejos para la medicacin en dermatologa: Por  favor, guarde las cajas en las que vienen los medicamentos de uso tpico para ayudarle a seguir las instrucciones sobre dnde y cmo usarlos. Las farmacias generalmente imprimen las instrucciones del medicamento slo en las cajas y no directamente en los tubos del [place].   Si su medicamento es muy caro, por favor, pngase en contacto con Zigmund Daniel llamando al [phone] y presione la opcin 4 o envenos un mensaje a travs de Pharmacist, community.   No podemos decirle cul ser su copago por los medicamentos por adelantado ya que esto es diferente dependiendo de la cobertura de su seguro. Sin embargo, es posible que podamos encontrar un medicamento sustituto a Electrical engineer un formulario para que el seguro cubra el medicamento que se considera necesario.   Si se requiere una autorizacin previa para que su compaa de seguros [place] su medicamento, por favor permtanos de 1 a 2 das hbiles para completar este proceso.  Los precios de los medicamentos varan con frecuencia dependiendo del Environmental consultant de dnde se surte la receta y alguna farmacias pueden ofrecer precios ms baratos.  El sitio web www.goodrx.com tiene cupones para medicamentos de Airline pilot. Los precios aqu no tienen en cuenta lo que podra costar con la ayuda del seguro (puede ser ms barato con su seguro), pero el sitio web puede darle el precio si no utiliz Research scientist (physical sciences).  - Puede imprimir el cupn correspondiente y llevarlo con su receta a la farmacia.  - Tambin puede pasar por nuestra oficina durante el horario de atencin regular y Charity fundraiser una tarjeta de cupones de GoodRx.  - Si necesita que su receta se enve electrnicamente a una farmacia diferente, informe a nuestra oficina a travs de MyChart de [place] o por telfono llamando al [phone] y presione la opcin 4.

## 2022-11-12 ENCOUNTER — Ambulatory Visit (INDEPENDENT_AMBULATORY_CARE_PROVIDER_SITE_OTHER): Payer: Medicaid Other | Admitting: Family Medicine

## 2022-11-12 ENCOUNTER — Encounter (INDEPENDENT_AMBULATORY_CARE_PROVIDER_SITE_OTHER): Payer: Self-pay | Admitting: Family Medicine

## 2022-11-12 VITALS — BP 108/60 | HR 65 | Temp 97.5°F | Ht 70.0 in | Wt 227.0 lb

## 2022-11-12 DIAGNOSIS — E785 Hyperlipidemia, unspecified: Secondary | ICD-10-CM

## 2022-11-12 DIAGNOSIS — E1169 Type 2 diabetes mellitus with other specified complication: Secondary | ICD-10-CM | POA: Diagnosis not present

## 2022-11-12 DIAGNOSIS — E1122 Type 2 diabetes mellitus with diabetic chronic kidney disease: Secondary | ICD-10-CM | POA: Diagnosis not present

## 2022-11-12 DIAGNOSIS — E669 Obesity, unspecified: Secondary | ICD-10-CM

## 2022-11-12 DIAGNOSIS — N183 Chronic kidney disease, stage 3 unspecified: Secondary | ICD-10-CM | POA: Diagnosis not present

## 2022-11-12 DIAGNOSIS — Z6832 Body mass index (BMI) 32.0-32.9, adult: Secondary | ICD-10-CM

## 2022-11-14 NOTE — Telephone Encounter (Signed)
Re submitted PA via fax to [employer]. Pending decision

## 2022-11-19 NOTE — Progress Notes (Signed)
Chief Complaint:   OBESITY Jane [name] is here to discuss her progress with her obesity treatment plan along with follow-up of her obesity related diagnoses. Jane [name] is on the Category 4 Plan and states she is following her eating plan approximately 50% of the time. Jane [name] states she is walking 7,000 steps 4 times per week.  Today's visit was #: 66 Starting weight: 283 lbs Starting date: 03/29/2020 Today's weight: 227 lbs Today's date: 11/12/2022 Total lbs lost to date: 56 lbs Total lbs lost since last in-office visit: 7  Interim History: Jane [name] has been more focused over the last month.  She is starting to do some exercises in a chair.  She was told she may have had RSV after last appointment.  Wants to focus on adding exercise consistently, getting GED and getting a car.  Subjective:   1. Hyperlipidemia associated with type 2 diabetes mellitus ([place]) Jane [name] is on Lipitor with no myalgias.  Last LDL of 104, HDL of 33, Trigly of 285.  Significant elevation of LDL for many years so likely significant family component.   2. CKD stage 3 due to type 2 diabetes mellitus ([place]) Labs discussed during visit today.  eGRF of 44, BUN/Cr of 33/1.40.  This is about patients base line.  Assessment/Plan:   1. Hyperlipidemia associated with type 2 diabetes mellitus (HCC) Continue statin. We will repeat labs in 3-6 months.  2. CKD stage 3 due to type 2 diabetes mellitus ([place]) Follow up BMP in 3-6 months.  3. Obesity with current BMI of 32.7 Jane [name] is currently in the action stage of change. As such, her goal is to continue with weight loss efforts. She has agreed to the Category 4 Plan.   Exercise goals: All adults should avoid inactivity. Some physical activity is better than none, and adults who participate in any amount of physical activity gain some health benefits.  Behavioral modification strategies: increasing lean protein intake, meal planning and cooking strategies, keeping healthy foods in the  home, and planning for success.  Jane [name] has agreed to follow-up with our clinic in 5 weeks. She was informed of the importance of frequent follow-up visits to maximize her success with intensive lifestyle modifications for her multiple health conditions.   Objective:   Blood pressure 108/60, pulse 65, temperature (!) 97.5 F (36.4 C), height '5\' 10"'$  (1.778 m), weight 227 lb (103 kg), SpO2 97 %. Body mass index is 32.57 kg/m.  General: Cooperative, alert, well developed, in no acute distress. HEENT: Conjunctivae and lids unremarkable. Cardiovascular: Regular rhythm.  Lungs: Normal work of breathing. Neurologic: No focal deficits.   Lab Results  Component Value Date   CREATININE 1.40 (H) 11/07/2022   BUN 33 (H) 11/07/2022   NA 143 11/07/2022   K 4.3 11/07/2022   CL 104 11/07/2022   CO2 23 11/07/2022   Lab Results  Component Value Date   ALT 16 11/07/2022   AST 12 11/07/2022   ALKPHOS 102 11/07/2022   BILITOT 0.3 11/07/2022   Lab Results  Component Value Date   HGBA1C 5.9 (H) 11/07/2022   HGBA1C 5.8 (A) 04/22/2022   HGBA1C 6.2 (H) 11/06/2021   HGBA1C 6.3 (H) 03/22/2021   HGBA1C 5.8 (A) 10/24/2020   Lab Results  Component Value Date   INSULIN 21.5 03/29/2020   Lab Results  Component Value Date   TSH 1.440 11/07/2022   Lab Results  Component Value Date   CHOL 187 11/07/2022   HDL 33 (L) 11/07/2022  LDLCALC 105 (H) 11/07/2022   TRIG 285 (H) 11/07/2022   CHOLHDL 5.7 (H) 11/07/2022   Lab Results  Component Value Date   VD25OH 47.10 02/21/2022   VD25OH 50.60 01/01/2021   VD25OH 45.30 10/17/2020   Lab Results  Component Value Date   WBC 6.5 10/01/2022   HGB 12.4 10/01/2022   HCT 37.8 10/01/2022   MCV 95.0 10/01/2022   PLT 136 (L) 10/01/2022   Lab Results  Component Value Date   IRON 35 10/01/2022   TIBC 306 10/01/2022   FERRITIN 89 10/01/2022   Attestation Statements:   Reviewed by clinician on day of visit: allergies, medications, problem list,  medical history, surgical history, family history, social history, and previous encounter notes.  Time spent on visit including pre-visit chart review and post-visit care and charting was 25 minutes.   I, Elnora Morrison, RMA am acting as transcriptionist for Coralie Common, MD.  I have reviewed the above documentation for accuracy and completeness, and I agree with the above. - Coralie Common, MD

## 2022-11-20 ENCOUNTER — Other Ambulatory Visit: Payer: Self-pay | Admitting: *Deleted

## 2022-11-20 DIAGNOSIS — I871 Compression of vein: Secondary | ICD-10-CM

## 2022-11-20 DIAGNOSIS — I87002 Postthrombotic syndrome without complications of left lower extremity: Secondary | ICD-10-CM

## 2022-11-26 LAB — HM MAMMOGRAPHY

## 2022-11-27 ENCOUNTER — Ambulatory Visit
Admission: RE | Admit: 2022-11-27 | Discharge: 2022-11-27 | Disposition: A | Discharge status: Home / Self Care | Payer: Medicaid Other | Source: Ambulatory Visit | Attending: Unknown Physician Specialty | Admitting: Unknown Physician Specialty

## 2022-11-27 ENCOUNTER — Telehealth: Payer: Self-pay

## 2022-11-27 DIAGNOSIS — H93A1 Pulsatile tinnitus, right ear: Secondary | ICD-10-CM

## 2022-11-27 MED ORDER — GADOPICLENOL 0.5 MMOL/ML IV SOLN
10.0000 mL | Freq: Once | INTRAVENOUS | Status: AC | PRN
  Administered 2022-11-27: 10 mL via INTRAVENOUS

## 2022-11-27 NOTE — Telephone Encounter (Signed)
Copied from [place] [id number]. Topic: Appointment Scheduling - Scheduling Inquiry for Clinic >> Nov 27, 2022  8:12 AM Marcellus Scott wrote: Reason for CRM: Pt is requesting a callback and would like to schedule a nurse visit for Coumadin. Pt is requesting an appointment for 12/03/2022. Pt stated anytime of the day works for her.  Please advise.

## 2022-11-27 NOTE — Telephone Encounter (Signed)
Mychart message sent. Patient already has appt for PT on 12/16/22 @ 9:20 a.m.

## 2022-11-28 ENCOUNTER — Encounter: Payer: Self-pay | Admitting: Family Medicine

## 2022-12-04 ENCOUNTER — Ambulatory Visit (INDEPENDENT_AMBULATORY_CARE_PROVIDER_SITE_OTHER): Payer: Medicaid Other | Admitting: Vascular Surgery

## 2022-12-04 ENCOUNTER — Ambulatory Visit (HOSPITAL_COMMUNITY)
Admission: RE | Admit: 2022-12-04 | Discharge: 2022-12-04 | Disposition: A | Discharge status: Home / Self Care | Payer: Medicaid Other | Source: Ambulatory Visit | Attending: Pediatrics | Admitting: Pediatrics

## 2022-12-04 ENCOUNTER — Other Ambulatory Visit: Payer: Self-pay

## 2022-12-04 ENCOUNTER — Ambulatory Visit (INDEPENDENT_AMBULATORY_CARE_PROVIDER_SITE_OTHER)
Admission: RE | Admit: 2022-12-04 | Discharge: 2022-12-04 | Disposition: A | Discharge status: Home / Self Care | Payer: Medicaid Other | Source: Ambulatory Visit | Attending: Vascular Surgery | Admitting: Vascular Surgery

## 2022-12-04 ENCOUNTER — Encounter: Payer: Self-pay | Admitting: Vascular Surgery

## 2022-12-04 VITALS — BP 131/80 | HR 55 | Temp 98.0°F | Resp 20 | Ht 70.0 in | Wt 220.0 lb

## 2022-12-04 DIAGNOSIS — I871 Compression of vein: Secondary | ICD-10-CM

## 2022-12-04 DIAGNOSIS — I87002 Postthrombotic syndrome without complications of left lower extremity: Secondary | ICD-10-CM | POA: Diagnosis not present

## 2022-12-04 NOTE — Progress Notes (Signed)
Patient ID: Jane [name], female   DOB: [birth year], 57 y.o.   MRN: [id number]  Reason for Consult: Follow-up   Referred by [name], MD  Subjective:     HPI:  Jane [name] is a 57 y.o. 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  Patient was transitioned to Coumadin now currently on Lovenox for upcoming colonoscopy.  She has lost 60 pounds recently with controlling her diet.  Her leg does have swelling in the thigh and down but really only bothers her a couple days a week and her symptoms are minimal.  She occasionally wears compression stockings but not religiously  Past Medical History:  Diagnosis Date   ADD (attention deficit disorder)    Anxiety    Back pain    Bilateral swelling of feet    Bipolar 1 disorder (HCC)    Bipolar 1 disorder (HCC)    Bipolar disorder (HCC)    Chewing difficulty    Chronic fatigue syndrome    Chronic kidney disease    Stage 3 kidney disease;dx by Dr. Sinda Du.    Constipation    Depression    Diabetes mellitus without complication (HCC)    diet controlled   Dyspnea    with exertion   GERD (gastroesophageal reflux disease)    Headache    migraines   High cholesterol    History of blood clots    History of DVT (deep vein thrombosis)    left leg   Hypertension    states under control with meds., has been on med. x 2 years   Hypothyroidism    Joint pain    Neuropathy    Obsessive-compulsive disorder    Peripheral vascular disease ([place])    Prediabetes    Respiratory failure requiring intubation ([place])    Restless leg syndrome    Shortness of breath    Sleep apnea    Thrombocytopenia ([place]) 09/15/2019   Trigger thumb of left hand 01/2018   Trigger thumb of right hand    Family History  Problem Relation Age of Onset    Heart disease Mother    Hyperlipidemia Mother    Hypertension Mother    Bipolar disorder Mother    Stroke Mother    Depression Mother    Sleep apnea Mother    Obesity Mother    Diabetes Father    Heart disease Father    Hyperlipidemia Father    Hypertension Father    Sleep apnea Father    Obesity Father    Drug abuse Daughter    ADD / ADHD Daughter    Drug abuse Daughter    Anxiety disorder Daughter    Bipolar disorder Daughter    Hypertension Sister    Hypertension Brother    Hyperlipidemia Brother    Heart disease Brother    Bipolar disorder Maternal Aunt    Suicidality Maternal Aunt    Past Surgical History:  Procedure Laterality Date   ABDOMINAL HYSTERECTOMY  06/2016   complete   APPLICATION OF WOUND VAC Left 04/27/2019   Procedure: APPLICATION OF WOUND VAC LEFT GROIN;  Surgeon: Waynetta Sandy, MD;  Location: [facility] OR;  Service: Vascular;  Laterality: Left;   AV FISTULA PLACEMENT Left 11/24/2018   Procedure: ARTERIOVENOUS (AV) FISTULA CREATION LEFT SFA TO LEFT FEMORAL VEIN;  Surgeon: Waynetta Sandy, MD;  Location: [place];  Service: Vascular;  Laterality: Left;   BACK SURGERY     CHOLECYSTECTOMY     COLONOSCOPY WITH PROPOFOL N/A 11/22/2019   Procedure: COLONOSCOPY WITH PROPOFOL;  Surgeon: [name], MD;  Location: [facility] ENDOSCOPY;  Service: Gastroenterology;  Laterality: N/A;   FEMORAL ARTERY EXPLORATION  03/30/2019   Procedure: Left Common Femoral Artery and Vein Exploration;  Surgeon: Waynetta Sandy, MD;  Location: [place];  Service: Vascular;;   FEMORAL-FEMORAL BYPASS GRAFT Left 11/24/2018   Procedure: BYPASS GRAFT FEMORAL-FEMORAL VENOUS LEFT TO RIGHT PALMA PROCEDURE USING CRYOVEIN;  Surgeon: Waynetta Sandy, MD;  Location: [place];  Service: Vascular;  Laterality: Left;   GROIN DEBRIDEMENT Left 04/27/2019   Procedure: GROIN DEBRIDEMENT;  Surgeon: Waynetta Sandy, MD;  Location: [place];  Service: Vascular;  Laterality: Left;    INSERTION OF ILIAC STENT  03/30/2019   Procedure: Stent of left common, external iliac veins and left common femoral vein;  Surgeon: Waynetta Sandy, MD;  Location: [place];  Service: Vascular;;   KNEE ARTHROSCOPY WITH MENISCAL REPAIR Left 11/14/2020   Procedure: LEFT KNEE ARTHROSCOPY WITH PARTIAL MEDIAL MENISCECTOMY;  Surgeon: Carole Civil, MD;  Location: [facility];  Service: Orthopedics;  Laterality: Left;   LOWER EXTREMITY VENOGRAPHY N/A 08/17/2018   Procedure: LOWER EXTREMITY VENOGRAPHY - Central Venogram;  Surgeon: Waynetta Sandy, MD;  Location: [place] CV LAB;  Service: Cardiovascular;  Laterality: N/A;   LOWER EXTREMITY VENOGRAPHY Bilateral 03/09/2019   Procedure: LOWER EXTREMITY VENOGRAPHY;  Surgeon: Waynetta Sandy, MD;  Location: [place] CV LAB;  Service: Cardiovascular;  Laterality: Bilateral;   LOWER EXTREMITY VENOGRAPHY Left 08/16/2019   Procedure: LOWER EXTREMITY VENOGRAPHY;  Surgeon: Waynetta Sandy, MD;  Location: [place] CV LAB;  Service: Cardiovascular;  Laterality: Left;   LOWER EXTREMITY VENOGRAPHY Left 03/25/2022   Procedure: LOWER EXTREMITY VENOGRAPHY;  Surgeon: Waynetta Sandy, MD;  Location: [place] CV LAB;  Service: Cardiovascular;  Laterality: Left;  IVUS   LUMBAR FUSION  11/21/2000   L5-S1   LUMBAR SPINE SURGERY     x 2 others   PATCH ANGIOPLASTY Left 03/30/2019   Procedure: Patch Angioplasty of the Left Common Femoral Vein using Venosure Biologic patch;  Surgeon: Waynetta Sandy, MD;  Location: [place];  Service: Vascular;  Laterality: Left;   PERIPHERAL VASCULAR INTERVENTION Left 08/16/2019   Procedure: PERIPHERAL VASCULAR INTERVENTION;  Surgeon: Waynetta Sandy, MD;  Location: [place] [facility] LAB;  Service: Cardiovascular;  Laterality: Left;  common femoral/femoral vein stent   PERIPHERAL VASCULAR INTERVENTION Left 03/25/2022   Procedure: PERIPHERAL VASCULAR INTERVENTION;  Surgeon: Waynetta Sandy, MD;  Location: [place] CV LAB;  Service: Cardiovascular;  Laterality: Left;  COMMON FEMORAL VEIN   PERIPHERAL VASCULAR THROMBECTOMY Left 03/25/2022   Procedure: PERIPHERAL VASCULAR THROMBECTOMY;  Surgeon: Waynetta Sandy, MD;  Location: [place] CV LAB;  Service: Cardiovascular;  Laterality: Left;  EXTREMITY/IVC   TRIGGER FINGER RELEASE Right 12/01/2017   Procedure: RELEASE TRIGGER FINGER/A-1 PULLEY RIGHT THUMB;  Surgeon: Leanora Cover, MD;  Location: [place];  Service: Orthopedics;  Laterality: Right;   TRIGGER FINGER RELEASE Left 01/26/2018   Procedure: LEFT TRIGGER THUMB RELEASE;  Surgeon: Leanora Cover, MD;  Location: [place];  Service: Orthopedics;  Laterality: Left;   ULTRASOUND GUIDANCE FOR VASCULAR ACCESS Right 03/30/2019   Procedure: Ultrasound-guided cannulation right internal jugular vein;  Surgeon: Waynetta Sandy, MD;  Location: [facility] OR;  Service: Vascular;  Laterality: Right;    Short Social History:  Social History   Tobacco Use   Smoking status: Never   Smokeless tobacco: Never  Substance Use Topics   Alcohol use: No    Allergies  Allergen Reactions   Aripiprazole Other (See Comments)    BECOMES  VIOLENT    Seroquel [Quetiapine Fumarate] Other (See Comments)    BECOMES VIOLENT   Chlorpromazine Other (See Comments)    SEVERE ANXIETY    Gabapentin Other (See Comments)    NIGHTMARES    Prednisone    Quetiapine     Current Outpatient Medications  Medication Sig Dispense Refill   ALPRAZolam (XANAX) 1 MG tablet Take 1 tablet (1 mg total) by mouth 2 (two) times daily as needed. for anxiety 60 tablet 2   amphetamine-dextroamphetamine (ADDERALL) 30 MG tablet Take 1 tablet by mouth 2 (two) times daily. 60 tablet 0   amphetamine-dextroamphetamine (ADDERALL) 30 MG tablet Take 1 tablet by mouth 2 (two) times daily. 60 tablet 0   aspirin EC 81 MG tablet Take 81 mg by mouth daily.      aspirin-acetaminophen-caffeine (EXCEDRIN MIGRAINE) 250-250-65 MG tablet Take 2 tablets by mouth every 6 (six) hours as needed for headache.     atorvastatin (LIPITOR) 40 MG tablet TAKE 1 TABLET BY MOUTH EVERY DAY 90 tablet 3   cetirizine (ZYRTEC) 10 MG tablet TAKE 1 TABLET BY MOUTH DAILY 90 tablet 1   colchicine 0.6 MG tablet Take 1.'2mg'$  (2 tablets) then 0.'6mg'$  (1 tablet) 1 hour after. Then, take 1 tablet every day for 7 days. 10 tablet 2   famotidine (PEPCID) 20 MG tablet Take by mouth.     FLUoxetine (PROZAC) 20 MG capsule Take 1 capsule (20 mg total) by mouth daily. Take with 40 mg to equal 60 mg daily 90 capsule 2   FLUoxetine (PROZAC) 40 MG capsule Take 1 capsule (40 mg total) by mouth daily. Take with 20 mg to equal 60 mg daily 90 capsule 2   GARLIC PO Take 1 tablet by mouth daily.     hydrochlorothiazide (HYDRODIURIL) 25 MG tablet TAKE 1 TABLET BY MOUTH DAILY Please schedule office visit BEFORE any future refills 90 tablet 1   HYDROcodone-acetaminophen (NORCO) 10-325 MG tablet Take 1 tablet by mouth every 6 (six) hours as needed. To last 30 days from fill date 120 tablet 0   hydrOXYzine (ATARAX) 25 MG tablet Take by mouth.     ketoconazole (NIZORAL) 2 % shampoo Apply topically daily.     levothyroxine (SYNTHROID) 125 MCG tablet Take 125 mcg by mouth daily before breakfast.     LINZESS 145 MCG CAPS capsule TAKE ONE CAPSULE BY MOUTH DAILY 90 capsule 3   lisinopril (ZESTRIL) 5 MG tablet TAKE 1 TABLET BY MOUTH DAILY 90 tablet 1   methocarbamol (ROBAXIN) 750 MG tablet Take 1 tablet (750 mg total) by mouth 2 (two) times daily as needed for muscle spasms. 60 tablet 5   Multiple Vitamin (MULTIVITAMIN) capsule Take 1 capsule by mouth daily.     omega-3 acid ethyl esters (LOVAZA) 1 g capsule Take by mouth.     Omega-3 Fatty Acids (OMEGA-3 FISH OIL PO) Take 1 capsule by mouth daily.     pantoprazole (PROTONIX) 40 MG tablet TAKE 1 TABLET BY MOUTH DAILY 90 tablet 3   polyethylene glycol powder  (GLYCOLAX/MIRALAX) 17 GM/SCOOP powder Take 17 g by mouth daily. 850 g 1   potassium chloride (KLOR-CON M)  10 MEQ tablet TAKE 1 TABLET BY MOUTH THREE TIMES DAILY 270 tablet 1   pregabalin (LYRICA) 100 MG capsule TAKE ONE CAPSULE BY MOUTH FOUR TIMES DAILY 120 capsule 1   rOPINIRole (REQUIP) 2 MG tablet TAKE 1 TABLET BY MOUTH TWICE DAILY 180 tablet 3   Semaglutide, 1 MG/DOSE, 4 MG/3ML SOPN Inject 1 mg as directed once a week. 9 mL 0   traZODone (DESYREL) 150 MG tablet Take 1 tablet (150 mg total) by mouth at bedtime. 30 tablet 2   triamcinolone cream (KENALOG) 0.1 % Apply to rash on lower legs once to twice daily as needed. Avoid face, groin, axilla. 80 g 2   valACYclovir (VALTREX) 1000 MG tablet Take 2 tablets by mouth at first onset of fever blisters, then take 2 tablets by mouth 12 hours later. 30 tablet 3   Vibegron (GEMTESA) 75 MG TABS Take 75 mg by mouth daily. 90 tablet 3   warfarin (COUMADIN) 5 MG tablet TAKE 1 AND 1/2 TABLETS BY MOUTH ON TUEDAYS AND THURSDAYS, THEN TAKE 1 TABLET DAILY ON ALL OTHER DAYS (Patient taking differently: Take 5 mg by mouth daily.) 60 tablet 3   enoxaparin (LOVENOX) 100 MG/ML injection Inject 1 mL (100 mg total) into the skin every 12 (twelve) hours for 14 days. Start 7 days prior to colonoscopy and continue until we tell you to stop post-procedure when INR is at goal. Hold morning of colonoscopy. 28 mL 0   No current facility-administered medications for this visit.    Review of Systems  Constitutional:  Constitutional negative. HENT: HENT negative.  Eyes: Eyes negative.  Respiratory: Respiratory negative.  Cardiovascular: Positive for leg swelling.  GI: Gastrointestinal negative.  Musculoskeletal: Positive for leg pain.  Skin: Skin negative.  Neurological: Neurological negative. Hematologic: Hematologic/lymphatic negative.  Psychiatric: Psychiatric negative.        Objective:  Objective   Vitals:   12/04/22 1132  BP: 131/80  Pulse: (!) 55  Resp:  20  Temp: 98 F (36.7 C)  SpO2: 97%  Weight: 220 lb (99.8 kg)  Height: '5\' 10"'$  (1.778 m)   Body mass index is 31.57 kg/m.  Physical Exam HENT:     Head: Normocephalic.     Nose: Nose normal.  Eyes:     Pupils: Pupils are equal, round, and reactive to light.  Cardiovascular:     Rate and Rhythm: Normal rate.  Pulmonary:     Effort: Pulmonary effort is normal.  Abdominal:     General: Abdomen is flat.  Musculoskeletal:     Right lower leg: No edema.     Left lower leg: Edema present.     Comments: Right leg 38cm/ Left leg 41 cm  Skin:    General: Skin is warm.     Capillary Refill: Capillary refill takes less than 2 seconds.  Neurological:     General: No focal deficit present.     Mental Status: She is alert.  Psychiatric:        Mood and Affect: Mood normal.        Behavior: Behavior normal.        Thought Content: Thought content normal.        Judgment: Judgment normal.     Data: RIGHTCompressibilityPhasicitySpontaneityPropertiesThrombus Aging  +-----+---------------+---------+-----------+----------+--------------+  CFV Full           Yes      Yes                                  +-----+---------------+---------+-----------+----------+--------------+    +---------+---------------+---------+-----------+---------------+----------  ----+  LEFT    CompressibilityPhasicitySpontaneityProperties     Thrombus  Aging  +---------+---------------+---------+-----------+---------------+----------  ----+  CFV     None           No       No         softly         Age                                                          echogenic       Indeterminate   +---------+---------------+---------+-----------+---------------+----------  ----+  SFJ     None           No       No         softly         Age                                                          echogenic       Indeterminate    +---------+---------------+---------+-----------+---------------+----------  ----+  FV Prox  None           No       No         softly         Age                                                          echogenic       Indeterminate   +---------+---------------+---------+-----------+---------------+----------  ----+  FV Mid   Full           No       No         Retrograde flow                 +---------+---------------+---------+-----------+---------------+----------  ----+  FV DistalPartial        No       No         Duplicated veinAge                                                          with one  patentIndeterminate                                               and the other  occluded                        +---------+---------------+---------+-----------+---------------+----------  ----+  PFV     Partial        No       Yes        partially      Age                                                          re-cannalized   Indeterminate   +---------+---------------+---------+-----------+---------------+----------  ----+  POP     Partial        No       Yes        softly         Age                                                          echogenic       Indeterminate   +---------+---------------+---------+-----------+---------------+----------  ----+  PTV     Full           Yes                                                 +---------+---------------+---------+-----------+---------------+----------  ----+  PERO    Full           Yes                                                 +---------+---------------+---------+-----------+---------------+----------  ----+  SSV     Partial        No       Yes        softly         Age                                                          echogenic        Indeterminate   +---------+---------------+---------+-----------+---------------+----------  ----+     Summary:  RIGHT:  - No evidence of common femoral vein obstruction.    LEFT:  - Findings consistent with age indeterminate deep vein thrombosis  involving the left common femoral vein, SF junction, left femoral vein,  left proximal profunda vein, and left popliteal vein.  - There is recanalized thrombus in the left profunda vein(s).      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  She will continue on Coumadin after her colonoscopy and bridged back from Lovenox.  Recommended continued compression stockings.  Patient weighs 220 today with plan for  20 more pounds of weight loss and hopefully this will improve her symptoms.  Her only options for revascularization of the venous system on the left include common femoral to IVC bypass which would only be performed for severe symptoms.Waynetta Sandy MD [facility]

## 2022-12-05 ENCOUNTER — Ambulatory Visit: Payer: Self-pay

## 2022-12-05 NOTE — Telephone Encounter (Signed)
Patient advised as below. And agreed to recommendations.

## 2022-12-05 NOTE — Telephone Encounter (Signed)
Allergic reactions to lovenox are very very rare.  Also consulted with our pharmacist about it.  She has taken this before without issue.  Hives can be from many different things. Recommend H1 and H2 blockers if not already taking (Zyrtec and pepcid). Would continue lovenox so she is able to get procedure. IF she develops any respiratory symptoms, needs to stop and seek emergent medical attention.

## 2022-12-05 NOTE — Telephone Encounter (Signed)
  Chief Complaint: Hives rash Symptoms: above Frequency: since starting Lovenox Pertinent Negatives: Patient denies tongue swelling, difficulty breathing Disposition: '[]'$ ED /'[]'$ Urgent Care (no appt availability in office) / '[]'$ Appointment(In office/virtual)/ '[]'$  [facility]/ '[]'$ Home Care/ '[x]'$ Refused Recommended Disposition /'[]'$ [facility]/ '[]'$  Follow-up with PCP Additional Notes: PT does not have transportation to come to the office.  Pt was placed on this medication in preparation for procedures next week.  Please advise.    Reason for Disposition  Hives or itching  Answer Assessment - Initial Assessment Questions 1. APPEARANCE of RASH: "Describe the rash." (e.g., spots, blisters, raised areas, skin peeling, scaly)     Red, itchy, patches 2. SIZE: "How big are the spots?" (e.g., tip of pen, eraser, coin; inches, centimeters)     Large patches 3. LOCATION: "Where is the rash located?"     Upper body 4. COLOR: "What color is the rash?" (Note: It is difficult to assess rash color in people with darker-colored skin. When this situation occurs, simply ask the caller to describe what they see.)     red 5. ONSET: "When did the rash begin?"     Monday started itching 6. FEVER: "Do you have a fever?" If Yes, ask: "What is your temperature, how was it measured, and when did it start?"     no 7. ITCHING: "Does the rash itch?" If Yes, ask: "How bad is the itch?" (Scale 1-10; or mild, moderate, severe)     severe 8. CAUSE: "What do you think is causing the rash?"     Lovenox 9. MEDICINE FACTORS: "Have you started any new medicines within the last 2 weeks?" (e.g., antibiotics)      yes 10. OTHER SYMPTOMS: "Do you have any other symptoms?" (e.g., dizziness, headache, sore throat, joint pain)       no 11. PREGNANCY: "Is there any chance you are pregnant?" "When was your last menstrual period?"       no  Protocols used: Rash or Redness - Widespread-A-AH, Rash - Widespread On  Drugs-A-AH

## 2022-12-07 ENCOUNTER — Other Ambulatory Visit: Payer: Self-pay | Admitting: Family Medicine

## 2022-12-07 DIAGNOSIS — E1159 Type 2 diabetes mellitus with other circulatory complications: Secondary | ICD-10-CM

## 2022-12-08 ENCOUNTER — Other Ambulatory Visit: Payer: Self-pay | Admitting: Family Medicine

## 2022-12-08 DIAGNOSIS — Z7901 Long term (current) use of anticoagulants: Secondary | ICD-10-CM

## 2022-12-09 ENCOUNTER — Ambulatory Visit: Payer: Medicaid Other | Admitting: Certified Registered"

## 2022-12-09 ENCOUNTER — Encounter: Payer: Self-pay | Admitting: Family Medicine

## 2022-12-09 ENCOUNTER — Ambulatory Visit
Admission: RE | Admit: 2022-12-09 | Discharge: 2022-12-09 | Disposition: A | Discharge status: Home / Self Care | Payer: Medicaid Other | Attending: Gastroenterology | Admitting: Gastroenterology

## 2022-12-09 ENCOUNTER — Encounter
Admission: RE | Disposition: A | Discharge status: Home / Self Care | Payer: Self-pay | Source: Home / Self Care | Attending: Gastroenterology

## 2022-12-09 DIAGNOSIS — Z09 Encounter for follow-up examination after completed treatment for conditions other than malignant neoplasm: Secondary | ICD-10-CM | POA: Diagnosis present

## 2022-12-09 DIAGNOSIS — E7800 Pure hypercholesterolemia, unspecified: Secondary | ICD-10-CM | POA: Insufficient documentation

## 2022-12-09 DIAGNOSIS — Z79899 Other long term (current) drug therapy: Secondary | ICD-10-CM | POA: Insufficient documentation

## 2022-12-09 DIAGNOSIS — E1122 Type 2 diabetes mellitus with diabetic chronic kidney disease: Secondary | ICD-10-CM | POA: Insufficient documentation

## 2022-12-09 DIAGNOSIS — E039 Hypothyroidism, unspecified: Secondary | ICD-10-CM | POA: Insufficient documentation

## 2022-12-09 DIAGNOSIS — F319 Bipolar disorder, unspecified: Secondary | ICD-10-CM | POA: Diagnosis not present

## 2022-12-09 DIAGNOSIS — F429 Obsessive-compulsive disorder, unspecified: Secondary | ICD-10-CM | POA: Insufficient documentation

## 2022-12-09 DIAGNOSIS — Z86718 Personal history of other venous thrombosis and embolism: Secondary | ICD-10-CM | POA: Insufficient documentation

## 2022-12-09 DIAGNOSIS — K219 Gastro-esophageal reflux disease without esophagitis: Secondary | ICD-10-CM | POA: Diagnosis not present

## 2022-12-09 DIAGNOSIS — Z86010 Personal history of colonic polyps: Secondary | ICD-10-CM | POA: Insufficient documentation

## 2022-12-09 DIAGNOSIS — Z6833 Body mass index (BMI) 33.0-33.9, adult: Secondary | ICD-10-CM | POA: Insufficient documentation

## 2022-12-09 DIAGNOSIS — F988 Other specified behavioral and emotional disorders with onset usually occurring in childhood and adolescence: Secondary | ICD-10-CM | POA: Insufficient documentation

## 2022-12-09 DIAGNOSIS — Z7985 Long-term (current) use of injectable non-insulin antidiabetic drugs: Secondary | ICD-10-CM | POA: Diagnosis not present

## 2022-12-09 DIAGNOSIS — G4730 Sleep apnea, unspecified: Secondary | ICD-10-CM | POA: Diagnosis not present

## 2022-12-09 DIAGNOSIS — E1151 Type 2 diabetes mellitus with diabetic peripheral angiopathy without gangrene: Secondary | ICD-10-CM | POA: Diagnosis not present

## 2022-12-09 DIAGNOSIS — G9332 Myalgic encephalomyelitis/chronic fatigue syndrome: Secondary | ICD-10-CM | POA: Insufficient documentation

## 2022-12-09 DIAGNOSIS — I129 Hypertensive chronic kidney disease with stage 1 through stage 4 chronic kidney disease, or unspecified chronic kidney disease: Secondary | ICD-10-CM | POA: Insufficient documentation

## 2022-12-09 DIAGNOSIS — G2581 Restless legs syndrome: Secondary | ICD-10-CM | POA: Diagnosis not present

## 2022-12-09 DIAGNOSIS — F419 Anxiety disorder, unspecified: Secondary | ICD-10-CM | POA: Diagnosis not present

## 2022-12-09 DIAGNOSIS — N189 Chronic kidney disease, unspecified: Secondary | ICD-10-CM | POA: Diagnosis not present

## 2022-12-09 HISTORY — PX: COLONOSCOPY WITH PROPOFOL: SHX5780

## 2022-12-09 LAB — GLUCOSE, CAPILLARY: Glucose-Capillary: 85 mg/dL (ref 70–99)

## 2022-12-09 SURGERY — COLONOSCOPY WITH PROPOFOL
Anesthesia: General

## 2022-12-09 MED ORDER — SODIUM CHLORIDE 0.9 % IV SOLN
INTRAVENOUS | Status: DC
  Administered 2022-12-09: 20 mL/h via INTRAVENOUS

## 2022-12-09 MED ORDER — PROPOFOL 10 MG/ML IV BOLUS
INTRAVENOUS | Status: DC | PRN
  Administered 2022-12-09 (×2): 40 mg via INTRAVENOUS
  Administered 2022-12-09: 120 mg via INTRAVENOUS
  Administered 2022-12-09 (×2): 40 mg via INTRAVENOUS

## 2022-12-09 MED ORDER — LIDOCAINE HCL (CARDIAC) PF 100 MG/5ML IV SOSY
PREFILLED_SYRINGE | INTRAVENOUS | Status: DC | PRN
  Administered 2022-12-09: 40 mg via INTRAVENOUS

## 2022-12-09 NOTE — Telephone Encounter (Signed)
Requested Prescriptions  Pending Prescriptions Disp Refills   pantoprazole (PROTONIX) 40 MG tablet [Pharmacy Med Name: pantoprazole 40 mg tablet,delayed release] 90 tablet 0    Sig: TAKE 1 TABLET BY MOUTH DAILY     Gastroenterology: Proton Pump Inhibitors Passed - 12/07/2022  8:03 AM      Passed - Valid encounter within last 12 months    Recent Outpatient Visits           1 month ago Encounter for annual health examination   [place], Dionne Bucy, MD   1 month ago Upper respiratory tract infection, unspecified type   [place] [name], PA-C   3 months ago Tinnitus of right ear   [place], Dionne Bucy, MD   4 months ago Other fatigue   [place], DO   5 months ago Ear pain, left   [place], [place], PA-C       Future Appointments             In 1 month [place], Shannon A, [place]   In 5 months [place], Dionne Bucy, MD [facility]             cetirizine (ZYRTEC) 10 MG tablet [Pharmacy Med Name: cetirizine 10 mg tablet] 90 tablet 0    Sig: TAKE 1 TABLET BY MOUTH DAILY     Ear, Nose, and Throat:  Antihistamines 2 Failed - 12/07/2022  8:03 AM      Failed - Cr in normal range and within 360 days    Creatinine, Ser  Date Value Ref Range Status  11/07/2022 1.40 (H) 0.57 - 1.00 mg/dL Final         Passed - Valid encounter within last 12 months    Recent Outpatient Visits           1 month ago Encounter for annual health examination   [facility], Dionne Bucy, MD   1 month ago Upper respiratory tract infection, unspecified type   [place] [name], PA-C   3 months ago Tinnitus of right ear   [place], Dionne Bucy, MD    4 months ago Other fatigue   [place], DO   5 months ago Ear pain, left   [place], [place], PA-C       Future Appointments             In 1 month [place], Shannon A, [place]   In 5 months Bacigalupo, Dionne Bucy, MD [facility]

## 2022-12-09 NOTE — H&P (Signed)
[name], MD [address], [place], [place], [place], [address] [id number], [place], [place], [place], [address] Phone: [phone]  Fax: [phone]  Primary Care Physician:  [name], MD   Pre-Procedure History & Physical: HPI:  Jane [name] is a 57 y.o. female is here for an colonoscopy.   Past Medical History:  Diagnosis Date   ADD (attention deficit disorder)    Anxiety    Back pain    Bilateral swelling of feet    Bipolar 1 disorder (HCC)    Bipolar 1 disorder (HCC)    Bipolar disorder (HCC)    Chewing difficulty    Chronic fatigue syndrome    Chronic kidney disease    Stage 3 kidney disease;dx by Dr. Sinda Du.    Constipation    Depression    Diabetes mellitus without complication (HCC)    diet controlled   Dyspnea    with exertion   GERD (gastroesophageal reflux disease)    Headache    migraines   High cholesterol    History of blood clots    History of DVT (deep vein thrombosis)    left leg   Hypertension    states under control with meds., has been on med. x 2 years   Hypothyroidism    Joint pain    Neuropathy    Obsessive-compulsive disorder    Peripheral vascular disease ([place])    Prediabetes    Respiratory failure requiring intubation ([place])    Restless leg syndrome    Shortness of breath    Sleep apnea    Thrombocytopenia ([place]) 09/15/2019   Trigger thumb of left hand 01/2018   Trigger thumb of right hand     Past Surgical History:  Procedure Laterality Date   ABDOMINAL HYSTERECTOMY  06/2016   complete   APPLICATION OF WOUND VAC Left 04/27/2019   Procedure: APPLICATION OF WOUND VAC LEFT GROIN;  Surgeon: Waynetta Sandy, MD;  Location: [facility];  Service: Vascular;  Laterality: Left;   AV FISTULA PLACEMENT Left 11/24/2018   Procedure: ARTERIOVENOUS (AV) FISTULA CREATION LEFT SFA TO LEFT FEMORAL VEIN;  Surgeon: Waynetta Sandy, MD;  Location: [facility];  Service: Vascular;  Laterality: Left;   BACK  SURGERY     CHOLECYSTECTOMY     COLONOSCOPY WITH PROPOFOL N/A 11/22/2019   Procedure: COLONOSCOPY WITH PROPOFOL;  Surgeon: [name], MD;  Location: [facility]COPY;  Service: Gastroenterology;  Laterality: N/A;   FEMORAL ARTERY EXPLORATION  03/30/2019   Procedure: Left Common Femoral Artery and Vein Exploration;  Surgeon: Waynetta Sandy, MD;  Location: [facility];  Service: Vascular;;   FEMORAL-FEMORAL BYPASS GRAFT Left 11/24/2018   Procedure: BYPASS GRAFT FEMORAL-FEMORAL VENOUS LEFT TO RIGHT PALMA PROCEDURE USING CRYOVEIN;  Surgeon: Waynetta Sandy, MD;  Location: [facility];  Service: Vascular;  Laterality: Left;   GROIN DEBRIDEMENT Left 04/27/2019   Procedure: GROIN DEBRIDEMENT;  Surgeon: Waynetta Sandy, MD;  Location: [facility];  Service: Vascular;  Laterality: Left;   INSERTION OF ILIAC STENT  03/30/2019   Procedure: Stent of left common, external iliac veins and left common femoral vein;  Surgeon: Waynetta Sandy, MD;  Location: [facility];  Service: Vascular;;   KNEE ARTHROSCOPY WITH MENISCAL REPAIR Left 11/14/2020   Procedure: LEFT KNEE ARTHROSCOPY WITH PARTIAL MEDIAL MENISCECTOMY;  Surgeon: Carole Civil, MD;  Location: [facility];  Service: Orthopedics;  Laterality: Left;   LOWER EXTREMITY VENOGRAPHY N/A 08/17/2018   Procedure: LOWER EXTREMITY VENOGRAPHY -  Central Venogram;  Surgeon: Waynetta Sandy, MD;  Location: [facility];  Service: Cardiovascular;  Laterality: N/A;   LOWER EXTREMITY VENOGRAPHY Bilateral 03/09/2019   Procedure: LOWER EXTREMITY VENOGRAPHY;  Surgeon: Waynetta Sandy, MD;  Location: [facility];  Service: Cardiovascular;  Laterality: Bilateral;   LOWER EXTREMITY VENOGRAPHY Left 08/16/2019   Procedure: LOWER EXTREMITY VENOGRAPHY;  Surgeon: Waynetta Sandy, MD;  Location: [facility];  Service: Cardiovascular;  Laterality: Left;   LOWER EXTREMITY VENOGRAPHY Left 03/25/2022   Procedure: LOWER EXTREMITY  VENOGRAPHY;  Surgeon: Waynetta Sandy, MD;  Location: [facility];  Service: Cardiovascular;  Laterality: Left;  IVUS   LUMBAR FUSION  11/21/2000   L5-S1   LUMBAR SPINE SURGERY     x 2 others   PATCH ANGIOPLASTY Left 03/30/2019   Procedure: Patch Angioplasty of the Left Common Femoral Vein using Venosure Biologic patch;  Surgeon: Waynetta Sandy, MD;  Location: [facility];  Service: Vascular;  Laterality: Left;   PERIPHERAL VASCULAR INTERVENTION Left 08/16/2019   Procedure: PERIPHERAL VASCULAR INTERVENTION;  Surgeon: Waynetta Sandy, MD;  Location: [facility];  Service: Cardiovascular;  Laterality: Left;  common femoral/femoral vein stent   PERIPHERAL VASCULAR INTERVENTION Left 03/25/2022   Procedure: PERIPHERAL VASCULAR INTERVENTION;  Surgeon: Waynetta Sandy, MD;  Location: [facility];  Service: Cardiovascular;  Laterality: Left;  COMMON FEMORAL VEIN   PERIPHERAL VASCULAR THROMBECTOMY Left 03/25/2022   Procedure: PERIPHERAL VASCULAR THROMBECTOMY;  Surgeon: Waynetta Sandy, MD;  Location: [facility];  Service: Cardiovascular;  Laterality: Left;  EXTREMITY/IVC   TRIGGER FINGER RELEASE Right 12/01/2017   Procedure: RELEASE TRIGGER FINGER/A-1 PULLEY RIGHT THUMB;  Surgeon: Leanora Cover, MD;  Location: [facility];  Service: Orthopedics;  Laterality: Right;   TRIGGER FINGER RELEASE Left 01/26/2018   Procedure: LEFT TRIGGER THUMB RELEASE;  Surgeon: Leanora Cover, MD;  Location: [facility];  Service: Orthopedics;  Laterality: Left;   ULTRASOUND GUIDANCE FOR VASCULAR ACCESS Right 03/30/2019   Procedure: Ultrasound-guided cannulation right internal jugular vein;  Surgeon: Waynetta Sandy, MD;  Location: [facility];  Service: Vascular;  Laterality: Right;    Prior to Admission medications   Medication Sig Start Date End Date Taking? Authorizing Provider  ALPRAZolam Duanne Moron) 1 MG tablet Take 1 tablet (1  mg total) by mouth 2 (two) times daily as needed. for anxiety 11/01/22  Yes Cloria Spring, MD  amphetamine-dextroamphetamine (ADDERALL) 30 MG tablet Take 1 tablet by mouth 2 (two) times daily. 11/01/22 11/01/23 Yes Cloria Spring, MD  amphetamine-dextroamphetamine (ADDERALL) 30 MG tablet Take 1 tablet by mouth 2 (two) times daily. 11/01/22  Yes Cloria Spring, MD  aspirin EC 81 MG tablet Take 81 mg by mouth daily.   Yes [provider]  aspirin-acetaminophen-caffeine (EXCEDRIN MIGRAINE) [phone] MG tablet Take 2 tablets by mouth every 6 (six) hours as needed for headache.   Yes [provider]  atorvastatin (LIPITOR) 40 MG tablet TAKE 1 TABLET BY MOUTH EVERY DAY 06/27/22  Yes Ostwalt, Letitia Libra, PA-C  cetirizine (ZYRTEC) 10 MG tablet TAKE 1 TABLET BY MOUTH DAILY 05/31/22  Yes Bacigalupo, Dionne Bucy, MD  colchicine 0.6 MG tablet Take 1.'2mg'$  (2 tablets) then 0.'6mg'$  (1 tablet) 1 hour after. Then, take 1 tablet every day for 7 days. 05/15/22  Yes McDonald, Stephan Minister, DPM  famotidine (PEPCID) 20 MG tablet Take by mouth. 08/11/22  Yes [provider]  FLUoxetine (PROZAC)  20 MG capsule Take 1 capsule (20 mg total) by mouth daily. Take with 40 mg to equal 60 mg daily 11/01/22 11/01/23 Yes Cloria Spring, MD  FLUoxetine (PROZAC) 40 MG capsule Take 1 capsule (40 mg total) by mouth daily. Take with 20 mg to equal 60 mg daily 11/01/22  Yes Cloria Spring, MD  GARLIC PO Take 1 tablet by mouth daily.   Yes [provider]  hydrochlorothiazide (HYDRODIURIL) 25 MG tablet TAKE 1 TABLET BY MOUTH DAILY Please schedule office visit BEFORE any future refills 06/24/22  Yes Rumball, Jake Church, DO  HYDROcodone-acetaminophen (NORCO) 10-325 MG tablet Take 1 tablet by mouth every 6 (six) hours as needed. To last 30 days from fill date 11/26/22 12/26/22 Yes Gillis Santa, MD  hydrOXYzine (ATARAX) 25 MG tablet Take by mouth. 08/11/22  Yes [provider]  ketoconazole (NIZORAL) 2 % shampoo Apply  topically daily. 10/07/22  Yes [provider]  levothyroxine (SYNTHROID) 125 MCG tablet Take 125 mcg by mouth daily before breakfast.   Yes [provider]  LINZESS 145 MCG CAPS capsule TAKE ONE CAPSULE BY MOUTH DAILY 06/27/22  Yes Ostwalt, Janna, PA-C  lisinopril (ZESTRIL) 5 MG tablet TAKE 1 TABLET BY MOUTH DAILY 09/13/22  Yes Bacigalupo, Dionne Bucy, MD  methocarbamol (ROBAXIN) 750 MG tablet Take 1 tablet (750 mg total) by mouth 2 (two) times daily as needed for muscle spasms. 09/12/22  Yes Gillis Santa, MD  Multiple Vitamin (MULTIVITAMIN) capsule Take 1 capsule by mouth daily.   Yes [provider]  omega-3 acid ethyl esters (LOVAZA) 1 g capsule Take by mouth.   Yes [provider]  Omega-3 Fatty Acids (OMEGA-3 FISH OIL PO) Take 1 capsule by mouth daily.   Yes [provider]  pantoprazole (PROTONIX) 40 MG tablet TAKE 1 TABLET BY MOUTH DAILY 01/31/22  Yes Bacigalupo, Dionne Bucy, MD  polyethylene glycol powder (GLYCOLAX/MIRALAX) 17 GM/SCOOP powder Take 17 g by mouth daily. 08/06/22  Yes Myles Gip, DO  potassium chloride (KLOR-CON M) 10 MEQ tablet TAKE 1 TABLET BY MOUTH THREE TIMES DAILY 06/24/22  Yes Myles Gip, DO  pregabalin (LYRICA) 100 MG capsule TAKE ONE CAPSULE BY MOUTH FOUR TIMES DAILY 11/08/22  Yes Bacigalupo, Dionne Bucy, MD  rOPINIRole (REQUIP) 2 MG tablet TAKE 1 TABLET BY MOUTH TWICE DAILY 03/26/22  Yes Bacigalupo, Dionne Bucy, MD  Semaglutide, 1 MG/DOSE, 4 MG/3ML SOPN Inject 1 mg as directed once a week. 09/18/22  Yes Laqueta Linden, MD  traZODone (DESYREL) 150 MG tablet Take 1 tablet (150 mg total) by mouth at bedtime. 11/01/22  Yes Cloria Spring, MD  triamcinolone cream (KENALOG) 0.1 % Apply to rash on lower legs once to twice daily as needed. Avoid face, groin, axilla. 11/11/22  Yes Brendolyn Patty, MD  valACYclovir (VALTREX) 1000 MG tablet Take 2 tablets by mouth at first onset of fever blisters, then take 2 tablets by mouth 12 hours  later. 08/19/22  Yes Brendolyn Patty, MD  Vibegron (GEMTESA) 75 MG TABS Take 75 mg by mouth daily. 07/17/22  Yes Hollice Espy, MD  warfarin (COUMADIN) 5 MG tablet TAKE 1 AND 1/2 TABLETS BY MOUTH ON TUEDAYS AND THURSDAYS, THEN TAKE 1 TABLET DAILY ON ALL OTHER DAYS Patient taking differently: Take 5 mg by mouth daily. 02/25/22  Yes Birdie Sons, MD  enoxaparin (LOVENOX) 100 MG/ML injection Inject 1 mL (100 mg total) into the skin every 12 (twelve) hours for 14 days. Start 7 days prior to  colonoscopy and continue until we tell you to stop post-procedure when INR is at goal. Hold morning of colonoscopy. 10/01/22 10/15/22  [name], MD    Allergies as of 09/30/2022 - Review Complete 09/30/2022  Allergen Reaction Noted   Aripiprazole Other (See Comments) 04/05/2013   Seroquel [quetiapine fumarate] Other (See Comments) 06/17/2013   Chlorpromazine Other (See Comments) 04/05/2013   Gabapentin Other (See Comments) 04/05/2013   Prednisone  08/19/2022   Quetiapine  08/24/2022    Family History  Problem Relation Age of Onset   Heart disease Mother    Hyperlipidemia Mother    Hypertension Mother    Bipolar disorder Mother    Stroke Mother    Depression Mother    Sleep apnea Mother    Obesity Mother    Diabetes Father    Heart disease Father    Hyperlipidemia Father    Hypertension Father    Sleep apnea Father    Obesity Father    Drug abuse Daughter    ADD / ADHD Daughter    Drug abuse Daughter    Anxiety disorder Daughter    Bipolar disorder Daughter    Hypertension Sister    Hypertension Brother    Hyperlipidemia Brother    Heart disease Brother    Bipolar disorder Maternal Aunt    Suicidality Maternal Aunt     Social History   Socioeconomic History   Marital status: Divorced    Spouse name: Not on file   Number of children: 3   Years of education: Not on file   Highest education level: Not on file  Occupational History   Not on file  Tobacco Use   Smoking  status: Never   Smokeless tobacco: Never  Vaping Use   Vaping Use: Never used  Substance and Sexual Activity   Alcohol use: No   Drug use: No   Sexual activity: Not Currently    Partners: Male    Birth control/protection: Surgical  Other Topics Concern   Not on file  Social History Narrative   Not on file   Social Determinants of Health   Financial Resource Strain: High Risk (10/25/2020)   Overall Financial Resource Strain (CARDIA)    Difficulty of Paying Living Expenses: Hard  Food Insecurity: Food Insecurity Present (05/27/2022)   Hunger Vital Sign    Worried About Running Out of Food in the Last Year: Sometimes true    Ran Out of Food in the Last Year: Sometimes true  Transportation Needs: No Transportation Needs (05/27/2022)   PRAPARE - Hydrologist (Medical): No    Lack of Transportation (Non-Medical): No  Physical Activity: Inactive (10/25/2020)   Exercise Vital Sign    Days of Exercise per Week: 0 days    Minutes of Exercise per Session: 0 min  Stress: Stress Concern Present (10/25/2020)   [place]    Feeling of Stress : Very much  Social Connections: Socially Isolated (10/25/2020)   Social Connection and Isolation Panel [NHANES]    Frequency of Communication with Friends and Family: More than three times a week    Frequency of Social Gatherings with Friends and Family: Three times a week    Attends Religious Services: Never    Active Member of Clubs or Organizations: No    Attends Archivist Meetings: Never    Marital Status: Divorced  Human resources officer Violence: Not At Risk (10/25/2020)   Humiliation, Afraid,  Rape, and Kick questionnaire    Fear of Current or Ex-Partner: No    Emotionally Abused: No    Physically Abused: No    Sexually Abused: No    Review of Systems: See HPI, otherwise negative ROS  Physical Exam: BP 117/67   Pulse (!) 59   Temp (!)  96.9 F (36.1 C) (Temporal)   Resp 20   Ht '5\' 10"'$  (1.778 m)   Wt 106.6 kg   LMP  (LMP Unknown)   SpO2 99%   BMI 33.72 kg/m  General:   Alert,  pleasant and cooperative in NAD Head:  Normocephalic and atraumatic. Neck:  Supple; no masses or thyromegaly. Lungs:  Clear throughout to auscultation, normal respiratory effort.    Heart:  +S1, +S2, Regular rate and rhythm, No edema. Abdomen:  Soft, nontender and nondistended. Normal bowel sounds, without guarding, and without rebound.   Neurologic:  Alert and  oriented x4;  grossly normal neurologically.  Impression/Plan: Jane [name] is here for an colonoscopy to be performed for surveillance due to prior history of colon polyps   Risks, benefits, limitations, and alternatives regarding  colonoscopy have been reviewed with the patient.  Questions have been answered.  All parties agreeable.   [name], MD  12/09/2022, 8:48 AM

## 2022-12-09 NOTE — Anesthesia Postprocedure Evaluation (Signed)
Anesthesia Post Note  Patient: Jane [name]  Procedure(s) Performed: COLONOSCOPY WITH PROPOFOL  Patient location during evaluation: PACU Anesthesia Type: General Level of consciousness: awake and alert Pain management: pain level controlled Vital Signs Assessment: post-procedure vital signs reviewed and stable Respiratory status: spontaneous breathing, nonlabored ventilation, respiratory function stable and patient connected to nasal cannula oxygen Cardiovascular status: blood pressure returned to baseline and stable Postop Assessment: no apparent nausea or vomiting Anesthetic complications: no   No notable events documented.   Last Vitals:  Vitals:   12/09/22 0919 12/09/22 0929  BP: 93/70 108/81  Pulse: 63 (!) 55  Resp: 15 12  Temp: (!) 36.2 C   SpO2: 97% 100%    Last Pain:  Vitals:   12/09/22 0929  TempSrc:   PainSc: 0-No pain                 Molli Barrows

## 2022-12-09 NOTE — Telephone Encounter (Signed)
Requested medication (s) are due for refill today: yes  Requested medication (s) are on the active medication list: yes  Last refill:  02/24/22  Future visit scheduled: yes  Notes to clinic:  Manual Review: If patient's warfarin is managed by Anti-Coag team, route request to them. If not, route request to the provider.      Requested Prescriptions  Pending Prescriptions Disp Refills   warfarin (COUMADIN) 5 MG tablet [Pharmacy Med Name: warfarin 5 mg tablet] 60 tablet 3    Sig: TAKE 1 AND 1/2 TABLETS BY MOUTH ON TUEDAYS AND THURSDAYS, THEN TAKE 1 TABLET DAILY ON ALL OTHER DAYS     Hematology:  Anticoagulants - warfarin Failed - 12/08/2022  8:03 AM      Failed - Manual Review: If patient's warfarin is managed by Anti-Coag team, route request to them. If not, route request to the provider.      Failed - INR in normal range and within 30 days    POC INR  Date Value Ref Range Status  09/11/2022 2.0  Final   INR  Date Value Ref Range Status  11/07/2022 1.1 0.9 - 1.2 Final    Comment:    Reference interval is for non-anticoagulated patients. Suggested INR therapeutic range for Vitamin K antagonist therapy:    Standard Dose (moderate intensity                   therapeutic range):       2.0 - 3.0    Higher intensity therapeutic range       2.5 - 3.5          Failed - Valid encounter within last 3 months    Recent Outpatient Visits           1 month ago Encounter for annual health examination   [facility] [place], Dionne Bucy, MD   1 month ago Upper respiratory tract infection, unspecified type   [facility] [name], PA-C   3 months ago Tinnitus of right ear   [facility] [place], Dionne Bucy, MD   4 months ago Other fatigue   [facility] Rory Percy M, DO   5 months ago Ear pain, left   [facility] [place], [place], PA-C        Future Appointments             In 1 month [facility], Gordan Payment [place]   In 5 months Bacigalupo, Dionne Bucy, MD [facility]            Passed - HCT in normal range and within 360 days    HCT  Date Value Ref Range Status  10/01/2022 37.8 36.0 - 46.0 % Final   Hematocrit  Date Value Ref Range Status  08/06/2022 39.5 34.0 - 46.6 % Final         Passed - Patient is not pregnant

## 2022-12-09 NOTE — Anesthesia Preprocedure Evaluation (Signed)
Anesthesia Evaluation  Patient identified by MRN, date of birth, ID band Patient awake    Reviewed: Allergy & Precautions, H&P , NPO status , Patient's Chart, lab work & pertinent test results, reviewed documented beta blocker date and time   Airway Mallampati: II   Neck ROM: full    Dental  (+) Poor Dentition   Pulmonary shortness of breath, sleep apnea and Continuous Positive Airway Pressure Ventilation    Pulmonary exam normal        Cardiovascular Exercise Tolerance: Good hypertension, On Medications + Peripheral Vascular Disease  Normal cardiovascular exam Rhythm:regular Rate:Normal     Neuro/Psych  Headaches  Neuromuscular disease  negative psych ROS   GI/Hepatic Neg liver ROS,GERD  Medicated,,  Endo/Other  diabetes, Well ControlledHypothyroidism  Morbid obesity  Renal/GU Renal disease  negative genitourinary   Musculoskeletal   Abdominal   Peds  Hematology  (+) Blood dyscrasia, anemia   Anesthesia Other Findings Past Medical History: No date: ADD (attention deficit disorder) No date: Anxiety No date: Back pain No date: Bilateral swelling of feet No date: Bipolar 1 disorder (HCC) No date: Bipolar 1 disorder (HCC) No date: Bipolar disorder (HCC) No date: Chewing difficulty No date: Chronic fatigue syndrome No date: Chronic kidney disease     Comment:  Stage 3 kidney disease;dx by Dr. Sinda Du.  No date: Constipation No date: Depression No date: Diabetes mellitus without complication (HCC)     Comment:  diet controlled No date: Dyspnea     Comment:  with exertion No date: GERD (gastroesophageal reflux disease) No date: Headache     Comment:  migraines No date: High cholesterol No date: History of blood clots No date: History of DVT (deep vein thrombosis)     Comment:  left leg No date: Hypertension     Comment:  states under control with meds., has been on med. x 2               years No  date: Hypothyroidism No date: Joint pain No date: Neuropathy No date: Obsessive-compulsive disorder No date: Peripheral vascular disease (HCC) No date: Prediabetes No date: Respiratory failure requiring intubation ([place]) No date: Restless leg syndrome No date: Shortness of breath No date: Sleep apnea 09/15/2019: Thrombocytopenia ([facility]) 01/2018: Trigger thumb of left hand No date: Trigger thumb of right hand Past Surgical History: 06/2016: ABDOMINAL HYSTERECTOMY     Comment:  complete 12/01/7865: APPLICATION OF WOUND VAC; Left     Comment:  Procedure: APPLICATION OF WOUND VAC LEFT GROIN;                Surgeon: Waynetta Sandy, MD;  Location: [place];              Service: Vascular;  Laterality: Left; 11/24/2018: AV FISTULA PLACEMENT; Left     Comment:  Procedure: ARTERIOVENOUS (AV) FISTULA CREATION LEFT SFA               TO LEFT FEMORAL VEIN;  Surgeon: Waynetta Sandy, MD;  Location: [place];  Service: Vascular;                Laterality: Left; No date: BACK SURGERY No date: CHOLECYSTECTOMY 11/22/2019: COLONOSCOPY WITH PROPOFOL; N/A     Comment:  Procedure: COLONOSCOPY WITH PROPOFOL;  Surgeon: [name], MD;  Location: [place];  Service:               Gastroenterology;  Laterality: N/A; 03/30/2019: FEMORAL ARTERY EXPLORATION     Comment:  Procedure: Left Common Femoral Artery and Vein               Exploration;  Surgeon: Waynetta Sandy, MD;                Location: [place];  Service: Vascular;; 11/24/2018: FEMORAL-FEMORAL BYPASS GRAFT; Left     Comment:  Procedure: BYPASS GRAFT FEMORAL-FEMORAL VENOUS LEFT TO               RIGHT PALMA PROCEDURE USING CRYOVEIN;  Surgeon: Waynetta Sandy, MD;  Location: [place];  Service:               Vascular;  Laterality: Left; 04/27/2019: GROIN DEBRIDEMENT; Left     Comment:  Procedure: GROIN DEBRIDEMENT;  Surgeon: Waynetta Sandy, MD;   Location: [facility] OR;  Service: Vascular;                Laterality: Left; 03/30/2019: INSERTION OF ILIAC STENT     Comment:  Procedure: Stent of left common, external iliac veins               and left common femoral vein;  Surgeon: Waynetta Sandy, MD;  Location: [place];  Service: Vascular;; 11/14/2020: KNEE ARTHROSCOPY WITH MENISCAL REPAIR; Left     Comment:  Procedure: LEFT KNEE ARTHROSCOPY WITH PARTIAL MEDIAL               MENISCECTOMY;  Surgeon: Carole Civil, MD;                Location: [facility];  Service: Orthopedics;  Laterality:               Left; 08/17/2018: LOWER EXTREMITY VENOGRAPHY; N/A     Comment:  Procedure: LOWER EXTREMITY VENOGRAPHY - Central               Venogram;  Surgeon: Waynetta Sandy, MD;                Location: [place] [facility];  Service: Cardiovascular;                Laterality: N/A; 03/09/2019: LOWER EXTREMITY VENOGRAPHY; Bilateral     Comment:  Procedure: LOWER EXTREMITY VENOGRAPHY;  Surgeon: Waynetta Sandy, MD;  Location: [place] [facility];                Service: Cardiovascular;  Laterality: Bilateral; 08/16/2019: LOWER EXTREMITY VENOGRAPHY; Left     Comment:  Procedure: LOWER EXTREMITY VENOGRAPHY;  Surgeon: Waynetta Sandy, MD;  Location: [place] [facility];                Service: Cardiovascular;  Laterality: Left; 03/25/2022: LOWER EXTREMITY VENOGRAPHY; Left     Comment:  Procedure: LOWER EXTREMITY VENOGRAPHY;  Surgeon: Waynetta Sandy, MD;  Location: [place]  CV [facility];                Service: Cardiovascular;  Laterality: Left;  IVUS 11/21/2000: LUMBAR FUSION     Comment:  L5-S1 No date: LUMBAR SPINE SURGERY     Comment:  x 2 others 03/30/2019: PATCH ANGIOPLASTY; Left     Comment:  Procedure: Patch Angioplasty of the Left Common Femoral               Vein using Venosure Biologic patch;  Surgeon: Waynetta Sandy, MD;   Location: [place];  Service:               Vascular;  Laterality: Left; 08/16/2019: PERIPHERAL VASCULAR INTERVENTION; Left     Comment:  Procedure: PERIPHERAL VASCULAR INTERVENTION;  Surgeon:               Waynetta Sandy, MD;  Location: [place] [facility]               [facility];  Service: Cardiovascular;  Laterality: Left;  common              femoral/femoral vein stent 03/25/2022: PERIPHERAL VASCULAR INTERVENTION; Left     Comment:  Procedure: PERIPHERAL VASCULAR INTERVENTION;  Surgeon:               Waynetta Sandy, MD;  Location: [place] [facility]               [facility];  Service: Cardiovascular;  Laterality: Left;  COMMON              FEMORAL VEIN 03/25/2022: PERIPHERAL VASCULAR THROMBECTOMY; Left     Comment:  Procedure: PERIPHERAL VASCULAR THROMBECTOMY;  Surgeon:               Waynetta Sandy, MD;  Location: [place] [facility]               [facility];  Service: Cardiovascular;  Laterality: Left;                EXTREMITY/IVC 12/01/2017: TRIGGER FINGER RELEASE; Right     Comment:  Procedure: RELEASE TRIGGER FINGER/A-1 PULLEY RIGHT               THUMB;  Surgeon: Leanora Cover, MD;  Location: [place];  Service: Orthopedics;  Laterality:               Right; 01/26/2018: TRIGGER FINGER RELEASE; Left     Comment:  Procedure: LEFT TRIGGER THUMB RELEASE;  Surgeon: Leanora Cover, MD;  Location: [place];                Service: Orthopedics;  Laterality: Left; 03/30/2019: ULTRASOUND GUIDANCE FOR VASCULAR ACCESS; Right     Comment:  Procedure: Ultrasound-guided cannulation right internal               jugular vein;  Surgeon: Waynetta Sandy, MD;                Location: [facility] OR;  Service: Vascular;  Laterality: Right; BMI    Body Mass Index: 33.72 kg/m     Reproductive/Obstetrics negative OB ROS  Anesthesia Physical Anesthesia Plan  ASA: 3  Anesthesia Plan: General    Post-op Pain Management:    Induction:   PONV Risk Score and Plan:   Airway Management Planned:   Additional Equipment:   Intra-op Plan:   Post-operative Plan:   Informed Consent: I have reviewed the patients History and Physical, chart, labs and discussed the procedure including the risks, benefits and alternatives for the proposed anesthesia with the patient or authorized representative who has indicated his/her understanding and acceptance.     Dental Advisory Given  Plan Discussed with: CRNA  Anesthesia Plan Comments:        Anesthesia Quick Evaluation

## 2022-12-09 NOTE — Transfer of Care (Signed)
Immediate Anesthesia Transfer of Care Note  Patient: Jane [name]  Procedure(s) Performed: COLONOSCOPY WITH PROPOFOL  Patient Location: Endoscopy Unit  Anesthesia Type:General  Level of Consciousness: awake, alert , and oriented  Airway & Oxygen Therapy: Patient Spontanous Breathing and Patient connected to nasal cannula oxygen  Post-op Assessment: Report given to RN, Post -op Vital signs reviewed and stable, and Patient moving all extremities  Post vital signs: Reviewed and stable  Last Vitals:  Vitals Value Taken Time  BP 93/70 12/09/22 0919  Temp 36.2 C 12/09/22 0919  Pulse 62 12/09/22 0921  Resp 10 12/09/22 0921  SpO2 99 % 12/09/22 0921  Vitals shown include unvalidated device data.  Last Pain:  Vitals:   12/09/22 0919  TempSrc: Temporal  PainSc:          Complications: No notable events documented.

## 2022-12-09 NOTE — Op Note (Signed)
[facility] Gastroenterology Patient Name: Jane [name] Procedure Date: 12/09/2022 8:51 AM MRN: [id number] Account #: [id number] Date of Birth: 27-Nov-1965 Admit Type: Outpatient Age: 57 Room: [facility] ENDO ROOM 1 Gender: Female Note Status: Finalized Instrument Name: Park Meo [id number] Procedure:             Colonoscopy Indications:           Surveillance: Personal history of adenomatous polyps                         on last colonoscopy > 3 years ago Providers:             [name] MD, MD Medicines:             Monitored Anesthesia Care Complications:         No immediate complications. Procedure:             Pre-Anesthesia Assessment:                        - Prior to the procedure, a History and Physical was                         performed, and patient medications, allergies and                         sensitivities were reviewed. The patient's tolerance                         of previous anesthesia was reviewed.                        - The risks and benefits of the procedure and the                         sedation options and risks were discussed with the                         patient. All questions were answered and informed                         consent was obtained.                        - ASA Grade Assessment: II - A patient with mild                         systemic disease.                        After obtaining informed consent, the colonoscope was                         passed under direct vision. Throughout the procedure,                         the patient's blood pressure, pulse, and oxygen                         saturations were monitored continuously. The  Colonoscope was introduced through the anus and                         advanced to the the cecum, identified by the                         appendiceal orifice. The colonoscopy was performed                         with ease. The patient tolerated the  procedure well.                         The quality of the bowel preparation was excellent.                         The ileocecal valve, appendiceal orifice, and rectum                         were photographed. Findings:      The perianal and digital rectal examinations were normal.      The entire examined colon appeared normal on direct and retroflexion       views. Impression:            - The entire examined colon is normal on direct and                         retroflexion views.                        - No specimens collected. Recommendation:        - Discharge patient to home (with escort).                        - Resume previous diet.                        - Continue present medications.                        - Repeat colonoscopy in 5 years for surveillance. Procedure Code(s):     --- Professional ---                        [phone], Colonoscopy, flexible; diagnostic, including                         collection of specimen(s) by brushing or washing, when                         performed (separate procedure) Diagnosis Code(s):     --- Professional ---                        Z86.010, Personal history of colonic polyps CPT copyright 2022 [employer]. All rights reserved. The codes documented in this report are preliminary and upon coder review may  be revised to meet current compliance requirements. [name], MD [name] MD, MD 12/09/2022 9:18:25 AM This report has been signed electronically. Number of Addenda: 0 Note Initiated On: 12/09/2022 8:51 AM Scope Withdrawal Time: 0 hours 10 minutes 41  seconds  Total Procedure Duration: 0 hours 13 minutes 39 seconds  Estimated Blood Loss:  Estimated blood loss: none.      [facility]

## 2022-12-10 ENCOUNTER — Encounter: Payer: Self-pay | Admitting: Gastroenterology

## 2022-12-10 NOTE — Telephone Encounter (Signed)
I havent seen anything. Have you?

## 2022-12-12 ENCOUNTER — Other Ambulatory Visit (INDEPENDENT_AMBULATORY_CARE_PROVIDER_SITE_OTHER): Payer: Self-pay | Admitting: Family Medicine

## 2022-12-12 ENCOUNTER — Other Ambulatory Visit (HOSPITAL_COMMUNITY): Payer: Self-pay

## 2022-12-12 DIAGNOSIS — E1165 Type 2 diabetes mellitus with hyperglycemia: Secondary | ICD-10-CM

## 2022-12-16 ENCOUNTER — Ambulatory Visit: Payer: Medicaid Other | Admitting: Family Medicine

## 2022-12-16 ENCOUNTER — Ambulatory Visit (INDEPENDENT_AMBULATORY_CARE_PROVIDER_SITE_OTHER): Payer: Medicaid Other | Admitting: Family Medicine

## 2022-12-16 ENCOUNTER — Other Ambulatory Visit (HOSPITAL_COMMUNITY): Payer: Self-pay

## 2022-12-17 ENCOUNTER — Other Ambulatory Visit (HOSPITAL_COMMUNITY): Payer: Self-pay

## 2022-12-17 ENCOUNTER — Ambulatory Visit (INDEPENDENT_AMBULATORY_CARE_PROVIDER_SITE_OTHER): Payer: Medicaid Other | Admitting: Family Medicine

## 2022-12-17 ENCOUNTER — Encounter (INDEPENDENT_AMBULATORY_CARE_PROVIDER_SITE_OTHER): Payer: Self-pay | Admitting: Family Medicine

## 2022-12-17 ENCOUNTER — Other Ambulatory Visit: Payer: Self-pay

## 2022-12-17 VITALS — BP 112/64 | HR 60 | Temp 97.7°F | Ht 70.0 in | Wt 228.0 lb

## 2022-12-17 DIAGNOSIS — I152 Hypertension secondary to endocrine disorders: Secondary | ICD-10-CM

## 2022-12-17 DIAGNOSIS — Z6832 Body mass index (BMI) 32.0-32.9, adult: Secondary | ICD-10-CM

## 2022-12-17 DIAGNOSIS — E1159 Type 2 diabetes mellitus with other circulatory complications: Secondary | ICD-10-CM | POA: Diagnosis not present

## 2022-12-17 DIAGNOSIS — E1165 Type 2 diabetes mellitus with hyperglycemia: Secondary | ICD-10-CM

## 2022-12-17 DIAGNOSIS — Z7985 Long-term (current) use of injectable non-insulin antidiabetic drugs: Secondary | ICD-10-CM

## 2022-12-17 MED ORDER — SEMAGLUTIDE (1 MG/DOSE) 4 MG/3ML ~~LOC~~ SOPN
1.0000 mg | PEN_INJECTOR | SUBCUTANEOUS | 0 refills | Status: DC
  Filled 2022-12-17: qty 9, 84d supply, fill #0
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
## 2022-12-18 ENCOUNTER — Ambulatory Visit: Payer: Medicaid Other

## 2022-12-19 ENCOUNTER — Ambulatory Visit: Payer: Medicaid Other | Admitting: Student in an Organized Health Care Education/Training Program

## 2022-12-19 ENCOUNTER — Ambulatory Visit
Admission: RE | Admit: 2022-12-19 | Discharge: 2022-12-19 | Disposition: A | Discharge status: Home / Self Care | Payer: Medicaid Other | Source: Ambulatory Visit | Attending: Student in an Organized Health Care Education/Training Program | Admitting: Student in an Organized Health Care Education/Training Program

## 2022-12-19 ENCOUNTER — Other Ambulatory Visit: Payer: Self-pay | Admitting: Student in an Organized Health Care Education/Training Program

## 2022-12-19 ENCOUNTER — Encounter: Payer: Self-pay | Admitting: Student in an Organized Health Care Education/Training Program

## 2022-12-19 VITALS — BP 115/72 | HR 56 | Temp 97.3°F | Ht 70.0 in | Wt 225.0 lb

## 2022-12-19 DIAGNOSIS — G8929 Other chronic pain: Secondary | ICD-10-CM

## 2022-12-19 DIAGNOSIS — M5136 Other intervertebral disc degeneration, lumbar region: Secondary | ICD-10-CM

## 2022-12-19 DIAGNOSIS — M1712 Unilateral primary osteoarthritis, left knee: Secondary | ICD-10-CM | POA: Insufficient documentation

## 2022-12-19 DIAGNOSIS — G894 Chronic pain syndrome: Secondary | ICD-10-CM

## 2022-12-19 DIAGNOSIS — M961 Postlaminectomy syndrome, not elsewhere classified: Secondary | ICD-10-CM

## 2022-12-19 DIAGNOSIS — I871 Compression of vein: Secondary | ICD-10-CM | POA: Insufficient documentation

## 2022-12-19 DIAGNOSIS — M7918 Myalgia, other site: Secondary | ICD-10-CM | POA: Insufficient documentation

## 2022-12-19 DIAGNOSIS — M51369 Other intervertebral disc degeneration, lumbar region without mention of lumbar back pain or lower extremity pain: Secondary | ICD-10-CM

## 2022-12-19 DIAGNOSIS — M792 Neuralgia and neuritis, unspecified: Secondary | ICD-10-CM | POA: Insufficient documentation

## 2022-12-19 DIAGNOSIS — Z0289 Encounter for other administrative examinations: Secondary | ICD-10-CM | POA: Insufficient documentation

## 2022-12-19 DIAGNOSIS — E1142 Type 2 diabetes mellitus with diabetic polyneuropathy: Secondary | ICD-10-CM | POA: Diagnosis not present

## 2022-12-19 DIAGNOSIS — M546 Pain in thoracic spine: Secondary | ICD-10-CM

## 2022-12-19 MED ORDER — HYDROCODONE-ACETAMINOPHEN 10-325 MG PO TABS: 1.0000 | ORAL_TABLET | Freq: Four times a day (QID) | ORAL | 0 refills | Status: AC | PRN

## 2022-12-19 MED ORDER — METHOCARBAMOL 750 MG PO TABS: 750.0000 mg | ORAL_TABLET | Freq: Two times a day (BID) | ORAL | 5 refills | Status: DC | PRN

## 2022-12-19 MED ORDER — HYDROCODONE-ACETAMINOPHEN 10-325 MG PO TABS: 1.0000 | ORAL_TABLET | Freq: Four times a day (QID) | ORAL | 0 refills | Status: DC | PRN

## 2022-12-19 NOTE — Progress Notes (Signed)
Nursing Pain Medication Assessment:  Safety precautions to be maintained throughout the outpatient stay will include: orient to surroundings, keep bed in low position, maintain call bell within reach at all times, provide assistance with transfer out of bed and ambulation.  Medication Inspection Compliance: Pill count conducted under aseptic conditions, in front of the patient. Neither the pills nor the bottle was removed from the patient's sight at any time. Once count was completed pills were immediately returned to the patient in their original bottle.  Medication: Oxycodone/APAP Pill/Patch Count:  31 of 120 pills remain Pill/Patch Appearance: Markings consistent with prescribed medication Bottle Appearance: Standard pharmacy container. Clearly labeled. Filled Date: 01 / 23 / 2024 Last Medication intake:  TodaySafety precautions to be maintained throughout the outpatient stay will include: orient to surroundings, keep bed in low position, maintain call bell within reach at all times, provide assistance with transfer out of bed and ambulation.
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
## 2022-12-23 ENCOUNTER — Other Ambulatory Visit: Payer: Medicaid Other

## 2022-12-25 ENCOUNTER — Ambulatory Visit (INDEPENDENT_AMBULATORY_CARE_PROVIDER_SITE_OTHER): Payer: Medicaid Other

## 2022-12-25 DIAGNOSIS — Z86718 Personal history of other venous thrombosis and embolism: Secondary | ICD-10-CM

## 2022-12-25 LAB — POCT INR
INR: 1.4 — AB (ref 2.0–3.0)
POC INR: 1.4
PT: 16.2

## 2022-12-25 NOTE — Patient Instructions (Signed)
Description   7.5 mg x two days then 5 mg daily. F/U in 2 weeks.

## 2022-12-26 ENCOUNTER — Other Ambulatory Visit: Payer: Self-pay | Admitting: Podiatry

## 2022-12-26 ENCOUNTER — Encounter: Payer: Self-pay | Admitting: Hematology
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
## 2023-01-02 ENCOUNTER — Encounter (HOSPITAL_COMMUNITY): Payer: Self-pay | Admitting: Psychiatry

## 2023-01-02 ENCOUNTER — Encounter: Payer: Self-pay | Admitting: Hematology

## 2023-01-02 ENCOUNTER — Telehealth (HOSPITAL_COMMUNITY): Payer: No Typology Code available for payment source | Admitting: Psychiatry

## 2023-01-02 ENCOUNTER — Encounter: Payer: Self-pay | Admitting: Radiology

## 2023-01-02 DIAGNOSIS — F900 Attention-deficit hyperactivity disorder, predominantly inattentive type: Secondary | ICD-10-CM

## 2023-01-02 DIAGNOSIS — F3130 Bipolar disorder, current episode depressed, mild or moderate severity, unspecified: Secondary | ICD-10-CM | POA: Diagnosis not present

## 2023-01-02 MED ORDER — FLUOXETINE HCL 40 MG PO CAPS: 40.0000 mg | ORAL_CAPSULE | Freq: Two times a day (BID) | ORAL | 2 refills | Status: DC

## 2023-01-02 MED ORDER — AMPHETAMINE-DEXTROAMPHETAMINE 30 MG PO TABS: 30.0000 mg | ORAL_TABLET | Freq: Two times a day (BID) | ORAL | 0 refills | Status: DC

## 2023-01-02 MED ORDER — TRAZODONE HCL 150 MG PO TABS: 150.0000 mg | ORAL_TABLET | Freq: Every day | ORAL | 2 refills | Status: DC

## 2023-01-02 MED ORDER — ALPRAZOLAM 1 MG PO TABS: 1.0000 mg | ORAL_TABLET | Freq: Two times a day (BID) | ORAL | 2 refills | Status: DC | PRN
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
## 2023-01-07 ENCOUNTER — Ambulatory Visit
Admission: RE | Admit: 2023-01-07 | Discharge: 2023-01-07 | Disposition: A | Discharge status: Home / Self Care | Payer: Medicaid Other | Source: Ambulatory Visit | Attending: Unknown Physician Specialty | Admitting: Unknown Physician Specialty

## 2023-01-07 ENCOUNTER — Other Ambulatory Visit: Payer: Medicaid Other

## 2023-01-07 DIAGNOSIS — H93A1 Pulsatile tinnitus, right ear: Secondary | ICD-10-CM

## 2023-01-08 ENCOUNTER — Ambulatory Visit: Payer: Medicaid Other

## 2023-01-14 ENCOUNTER — Encounter (INDEPENDENT_AMBULATORY_CARE_PROVIDER_SITE_OTHER): Payer: Self-pay | Admitting: Family Medicine

## 2023-01-14 ENCOUNTER — Ambulatory Visit (INDEPENDENT_AMBULATORY_CARE_PROVIDER_SITE_OTHER): Payer: Medicaid Other | Admitting: Family Medicine

## 2023-01-14 VITALS — BP 109/64 | HR 65 | Temp 97.9°F | Ht 70.0 in | Wt 228.0 lb

## 2023-01-14 DIAGNOSIS — E1165 Type 2 diabetes mellitus with hyperglycemia: Secondary | ICD-10-CM

## 2023-01-14 DIAGNOSIS — Z7985 Long-term (current) use of injectable non-insulin antidiabetic drugs: Secondary | ICD-10-CM

## 2023-01-14 DIAGNOSIS — Z6832 Body mass index (BMI) 32.0-32.9, adult: Secondary | ICD-10-CM

## 2023-01-14 DIAGNOSIS — E669 Obesity, unspecified: Secondary | ICD-10-CM

## 2023-01-14 DIAGNOSIS — E1169 Type 2 diabetes mellitus with other specified complication: Secondary | ICD-10-CM

## 2023-01-14 DIAGNOSIS — E785 Hyperlipidemia, unspecified: Secondary | ICD-10-CM | POA: Diagnosis not present

## 2023-01-14 NOTE — Progress Notes (Deleted)
Patient is working on getting in 10k steps daily and started going to the gym 3x a week.  She has been to the gym for the last week and a half.  She does a bit of cardio and then focuses on muscle group activation in the form of resistance training.  Food wise she is doing around 75% on plan.  She did do some emotional eating for about 10 days due to a depressive episode.  Her prozac was recently increased to '40mg'$  twice a day.

## 2023-01-15 ENCOUNTER — Ambulatory Visit (INDEPENDENT_AMBULATORY_CARE_PROVIDER_SITE_OTHER): Payer: Medicaid Other

## 2023-01-15 DIAGNOSIS — Z86718 Personal history of other venous thrombosis and embolism: Secondary | ICD-10-CM

## 2023-01-15 LAB — POCT INR
INR: 1.8 — AB (ref 2.0–3.0)
POC INR: 1.8
PT: 22.1

## 2023-01-15 NOTE — Patient Instructions (Signed)
Description   5 mg daily except 7.5 mg on Wed and Sun. F/U in 2 weeks.

## 2023-01-16 NOTE — Progress Notes (Unsigned)
01/19/23 7:23 PM   Jane [name] November 11-Oct-1966 [id number]  Referring provider:  [name], MD [address] [place],  [place] [address] Chief Complaint  Patient presents with   Urinary Incontinence   Urological history 1. OAB -Contributing factors of age, vaginal atrophy, obesity, anxiety, constipation, diabetes, back pain, hypertension, pelvic surgery and diuretics. -cysto (09/2021) - NED  -PVR 15 mL -Gemtesa 75 mg daily-she has not been on the medications for several weeks   HPI: Jane [name] is a 57 y.o.female  with a personal history of chronic kidney disease stage III, acute cystitis and OAB , who presents today for a 12 month follow-up.   She is having 1-7 daytime urinations, 3 or more episodes of nocturia with a strong urge to urinate.  She is having incontinence with both urge and stress.  She is leaking 3 or more times daily.  She is going through 5 or more absorbent pads daily.  She does engage in toilet mapping.  Patient denies any modifying or aggravating factors.  Patient denies any gross hematuria, dysuria or suprapubic/flank pain.  Patient denies any fevers, chills, nausea or vomiting.    UA yellow clear, specific gravity 1.015, trace blood, pH 6.5, 0-5 WBCs, 3-10 RBCs, 0-10 epithelial cells and moderate bacteria.  PVR 15 mL  PMH: Past Medical History:  Diagnosis Date   ADD (attention deficit disorder)    Anxiety    Back pain    Bilateral swelling of feet    Bipolar 1 disorder (HCC)    Bipolar 1 disorder (HCC)    Bipolar disorder (HCC)    Chewing difficulty    Chronic fatigue syndrome    Chronic kidney disease    Stage 3 kidney disease;dx by Dr. Sinda Du.    Constipation    Depression    Diabetes mellitus without complication (HCC)    diet controlled   Dyspnea    with exertion   GERD (gastroesophageal reflux disease)    Headache    migraines   High cholesterol    History of blood clots    History of DVT (deep vein  thrombosis)    left leg   Hypertension    states under control with meds., has been on med. x 2 years   Hypothyroidism    Joint pain    Neuropathy    Obsessive-compulsive disorder    Peripheral vascular disease ([place])    Prediabetes    Respiratory failure requiring intubation ([place])    Restless leg syndrome    Shortness of breath    Sleep apnea    Thrombocytopenia ([place]) 09/15/2019   Trigger thumb of left hand 01/2018   Trigger thumb of right hand     Surgical History: Past Surgical History:  Procedure Laterality Date   ABDOMINAL HYSTERECTOMY  06/2016   complete   APPLICATION OF WOUND VAC Left 04/27/2019   Procedure: APPLICATION OF WOUND VAC LEFT GROIN;  Surgeon: Waynetta Sandy, MD;  Location: [place];  Service: Vascular;  Laterality: Left;   AV FISTULA PLACEMENT Left 11/24/2018   Procedure: ARTERIOVENOUS (AV) FISTULA CREATION LEFT SFA TO LEFT FEMORAL VEIN;  Surgeon: Waynetta Sandy, MD;  Location: [place];  Service: Vascular;  Laterality: Left;   BACK SURGERY     CHOLECYSTECTOMY     COLONOSCOPY WITH PROPOFOL N/A 11/22/2019   Procedure: COLONOSCOPY WITH PROPOFOL;  Surgeon: [name], MD;  Location: [facility] ENDOSCOPY;  Service: Gastroenterology;  Laterality: N/A;   COLONOSCOPY WITH PROPOFOL N/A  12/09/2022   Procedure: COLONOSCOPY WITH PROPOFOL;  Surgeon: [name], MD;  Location: [facility] ENDOSCOPY;  Service: Gastroenterology;  Laterality: N/A;   FEMORAL ARTERY EXPLORATION  03/30/2019   Procedure: Left Common Femoral Artery and Vein Exploration;  Surgeon: Waynetta Sandy, MD;  Location: [place];  Service: Vascular;;   FEMORAL-FEMORAL BYPASS GRAFT Left 11/24/2018   Procedure: BYPASS GRAFT FEMORAL-FEMORAL VENOUS LEFT TO RIGHT PALMA PROCEDURE USING CRYOVEIN;  Surgeon: Waynetta Sandy, MD;  Location: [place];  Service: Vascular;  Laterality: Left;   GROIN DEBRIDEMENT Left 04/27/2019   Procedure: GROIN DEBRIDEMENT;  Surgeon: Waynetta Sandy, MD;   Location: [place];  Service: Vascular;  Laterality: Left;   INSERTION OF ILIAC STENT  03/30/2019   Procedure: Stent of left common, external iliac veins and left common femoral vein;  Surgeon: Waynetta Sandy, MD;  Location: [place];  Service: Vascular;;   KNEE ARTHROSCOPY WITH MENISCAL REPAIR Left 11/14/2020   Procedure: LEFT KNEE ARTHROSCOPY WITH PARTIAL MEDIAL MENISCECTOMY;  Surgeon: Carole Civil, MD;  Location: [facility];  Service: Orthopedics;  Laterality: Left;   LOWER EXTREMITY VENOGRAPHY N/A 08/17/2018   Procedure: LOWER EXTREMITY VENOGRAPHY - Central Venogram;  Surgeon: Waynetta Sandy, MD;  Location: [place] [facility];  Service: Cardiovascular;  Laterality: N/A;   LOWER EXTREMITY VENOGRAPHY Bilateral 03/09/2019   Procedure: LOWER EXTREMITY VENOGRAPHY;  Surgeon: Waynetta Sandy, MD;  Location: [place] [facility];  Service: Cardiovascular;  Laterality: Bilateral;   LOWER EXTREMITY VENOGRAPHY Left 08/16/2019   Procedure: LOWER EXTREMITY VENOGRAPHY;  Surgeon: Waynetta Sandy, MD;  Location: [place] [facility];  Service: Cardiovascular;  Laterality: Left;   LOWER EXTREMITY VENOGRAPHY Left 03/25/2022   Procedure: LOWER EXTREMITY VENOGRAPHY;  Surgeon: Waynetta Sandy, MD;  Location: [place] [facility];  Service: Cardiovascular;  Laterality: Left;  IVUS   LUMBAR FUSION  11/21/2000   L5-S1   LUMBAR SPINE SURGERY     x 2 others   PATCH ANGIOPLASTY Left 03/30/2019   Procedure: Patch Angioplasty of the Left Common Femoral Vein using Venosure Biologic patch;  Surgeon: Waynetta Sandy, MD;  Location: [place];  Service: Vascular;  Laterality: Left;   PERIPHERAL VASCULAR INTERVENTION Left 08/16/2019   Procedure: PERIPHERAL VASCULAR INTERVENTION;  Surgeon: Waynetta Sandy, MD;  Location: [place] [facility];  Service: Cardiovascular;  Laterality: Left;  common femoral/femoral vein stent   PERIPHERAL VASCULAR INTERVENTION Left 03/25/2022    Procedure: PERIPHERAL VASCULAR INTERVENTION;  Surgeon: Waynetta Sandy, MD;  Location: [place] [facility];  Service: Cardiovascular;  Laterality: Left;  COMMON FEMORAL VEIN   PERIPHERAL VASCULAR THROMBECTOMY Left 03/25/2022   Procedure: PERIPHERAL VASCULAR THROMBECTOMY;  Surgeon: Waynetta Sandy, MD;  Location: [place] [facility];  Service: Cardiovascular;  Laterality: Left;  EXTREMITY/IVC   TRIGGER FINGER RELEASE Right 12/01/2017   Procedure: RELEASE TRIGGER FINGER/A-1 PULLEY RIGHT THUMB;  Surgeon: Leanora Cover, MD;  Location: [place];  Service: Orthopedics;  Laterality: Right;   TRIGGER FINGER RELEASE Left 01/26/2018   Procedure: LEFT TRIGGER THUMB RELEASE;  Surgeon: Leanora Cover, MD;  Location: [place];  Service: Orthopedics;  Laterality: Left;   ULTRASOUND GUIDANCE FOR VASCULAR ACCESS Right 03/30/2019   Procedure: Ultrasound-guided cannulation right internal jugular vein;  Surgeon: Waynetta Sandy, MD;  Location: [place];  Service: Vascular;  Laterality: Right;    Home Medications:  Allergies as of 01/17/2023       Reactions   Aripiprazole Other (See  Comments)   BECOMES  VIOLENT   Seroquel [quetiapine Fumarate] Other (See Comments)   BECOMES VIOLENT   Chlorpromazine Other (See Comments)   SEVERE ANXIETY   Gabapentin Other (See Comments)   NIGHTMARES   Prednisone    Quetiapine         Medication List        Accurate as of January 17, 2023 11:59 PM. If you have any questions, ask your nurse or doctor.          ALPRAZolam 1 MG tablet Commonly known as: XANAX Take 1 tablet (1 mg total) by mouth 2 (two) times daily as needed. for anxiety   amphetamine-dextroamphetamine 30 MG tablet Commonly known as: ADDERALL Take 1 tablet by mouth 2 (two) times daily.   amphetamine-dextroamphetamine 30 MG tablet Commonly known as: Adderall Take 1 tablet by mouth 2 (two) times daily.   aspirin EC 81 MG tablet Take 81 mg by  mouth daily.   aspirin-acetaminophen-caffeine 250-250-65 MG tablet Commonly known as: EXCEDRIN MIGRAINE Take 2 tablets by mouth every 6 (six) hours as needed for headache.   atorvastatin 40 MG tablet Commonly known as: LIPITOR TAKE 1 TABLET BY MOUTH EVERY DAY   cetirizine 10 MG tablet Commonly known as: ZYRTEC TAKE 1 TABLET BY MOUTH DAILY   colchicine 0.6 MG tablet TAKE 2 TABLETS, TAKE 1 TABLET BY MOUTH 1 hour AFTER, THEN TAKE 1 TABLET EVERY DAY FOR 7 DAYS   famotidine 20 MG tablet Commonly known as: PEPCID Take by mouth.   FLUoxetine 40 MG capsule Commonly known as: PROZAC Take 1 capsule (40 mg total) by mouth 2 (two) times daily.   GARLIC PO Take 1 tablet by mouth daily.   Gemtesa 75 MG Tabs Generic drug: Vibegron Take 75 mg by mouth daily. What changed: Another medication with the same name was added. Make sure you understand how and when to take each. Changed by: Zara Council, PA-C   Gemtesa 75 MG Tabs Generic drug: Vibegron Take 1 tablet (75 mg total) by mouth daily. What changed: You were already taking a medication with the same name, and this prescription was added. Make sure you understand how and when to take each. Changed by: Zara Council, PA-C   hydrochlorothiazide 25 MG tablet Commonly known as: HYDRODIURIL TAKE 1 TABLET BY MOUTH DAILY (Please schedule office visit BEFORE any future refills)   HYDROcodone-acetaminophen 10-325 MG tablet Commonly known as: NORCO Take 1 tablet by mouth every 6 (six) hours as needed. To last 30 days from fill date   HYDROcodone-acetaminophen 10-325 MG tablet Commonly known as: NORCO Take 1 tablet by mouth every 6 (six) hours as needed. To last 30 days from fill date Start taking on: January 25, 2023   HYDROcodone-acetaminophen 10-325 MG tablet Commonly known as: NORCO Take 1 tablet by mouth every 6 (six) hours as needed. To last 30 days from fill date Start taking on: February 24, 2023   hydrOXYzine 25 MG  tablet Commonly known as: ATARAX Take by mouth.   ketoconazole 2 % shampoo Commonly known as: NIZORAL Apply topically daily.   levothyroxine 125 MCG tablet Commonly known as: SYNTHROID Take 125 mcg by mouth daily before breakfast.   Linzess 145 MCG Caps capsule Generic drug: linaclotide TAKE ONE CAPSULE BY MOUTH DAILY   lisinopril 5 MG tablet Commonly known as: ZESTRIL TAKE 1 TABLET BY MOUTH DAILY   methocarbamol 750 MG tablet Commonly known as: ROBAXIN Take 1 tablet (750 mg total) by mouth 2 (two) times  daily as needed for muscle spasms.   multivitamin capsule Take 1 capsule by mouth daily.   omega-3 acid ethyl esters 1 g capsule Commonly known as: LOVAZA Take by mouth.   OMEGA-3 FISH OIL PO Take 1 capsule by mouth daily.   Ozempic (1 MG/DOSE) 4 MG/3ML Sopn Generic drug: Semaglutide (1 MG/DOSE) Inject 1 mg as directed once a week.   pantoprazole 40 MG tablet Commonly known as: PROTONIX TAKE 1 TABLET BY MOUTH DAILY   polyethylene glycol powder 17 GM/SCOOP powder Commonly known as: GLYCOLAX/MIRALAX Take 17 g by mouth daily.   potassium chloride 10 MEQ tablet Commonly known as: KLOR-CON M TAKE 1 TABLET BY MOUTH THREE TIMES DAILY   pregabalin 100 MG capsule Commonly known as: LYRICA TAKE ONE CAPSULE BY MOUTH FOUR TIMES DAILY   rOPINIRole 2 MG tablet Commonly known as: REQUIP TAKE 1 TABLET BY MOUTH TWICE DAILY   traZODone 150 MG tablet Commonly known as: DESYREL Take 1 tablet (150 mg total) by mouth at bedtime.   triamcinolone cream 0.1 % Commonly known as: KENALOG Apply to rash on lower legs once to twice daily as needed. Avoid face, groin, axilla.   valACYclovir 1000 MG tablet Commonly known as: VALTREX Take 2 tablets by mouth at first onset of fever blisters, then take 2 tablets by mouth 12 hours later.   warfarin 5 MG tablet Commonly known as: COUMADIN Take as directed by the anticoagulation clinic. If you are unsure how to take this medication,  talk to your nurse or doctor. Original instructions: Take 1 tablet (5 mg total) by mouth daily.        Allergies:  Allergies  Allergen Reactions   Aripiprazole Other (See Comments)    BECOMES  VIOLENT    Seroquel [Quetiapine Fumarate] Other (See Comments)    BECOMES VIOLENT   Chlorpromazine Other (See Comments)    SEVERE ANXIETY    Gabapentin Other (See Comments)    NIGHTMARES    Prednisone    Quetiapine     Family History: Family History  Problem Relation Age of Onset   Heart disease Mother    Hyperlipidemia Mother    Hypertension Mother    Bipolar disorder Mother    Stroke Mother    Depression Mother    Sleep apnea Mother    Obesity Mother    Diabetes Father    Heart disease Father    Hyperlipidemia Father    Hypertension Father    Sleep apnea Father    Obesity Father    Drug abuse Daughter    ADD / ADHD Daughter    Drug abuse Daughter    Anxiety disorder Daughter    Bipolar disorder Daughter    Hypertension Sister    Hypertension Brother    Hyperlipidemia Brother    Heart disease Brother    Bipolar disorder Maternal Aunt    Suicidality Maternal Aunt     Social History:  reports that she has never smoked. She has never used smokeless tobacco. She reports that she does not drink alcohol and does not use drugs.   Physical Exam: BP 107/69   Pulse 65   Ht 5\' 10"  (1.778 m)   Wt 228 lb (103.4 kg)   LMP  (LMP Unknown)   BMI 32.71 kg/m   Constitutional:  Well nourished. Alert and oriented, No acute distress. HEENT: [place] AT, moist mucus membranes.  Trachea midline Cardiovascular: No clubbing, cyanosis, or edema. Respiratory: Normal respiratory effort, no increased work of breathing. Neurologic:  Grossly intact, no focal deficits, moving all 4 extremities. Psychiatric: Normal mood and affect.    Laboratory Data: Lab Results  Component Value Date   CREATININE 1.40 (H) 11/07/2022   Lab Results  Component Value Date   HGBA1C 5.9 (H) 11/07/2022     Urinalysis  See EPIC and HPI I have reviewed the labs.   Pertinent imaging:  01/17/23 11:39  Scan Result 15    Assessment & Plan:    Overactive bladder, urgency/frequency  - restart Gemtesa 75 mg daily  2. Microscopic hematuria -UA w/ micro heme -Urine sent for culture -If urine culture returns positive, will treat and reassess to see if microscopic hematuria persists -If urine culture is negative, we will need to pursue upper tract imaging  Return in about 6 weeks (around 02/28/2023) for UA, OAB and PVR .   [facility] [address] [place], [place] [address] [phone]

## 2023-01-17 ENCOUNTER — Encounter: Payer: Self-pay | Admitting: Urology

## 2023-01-17 ENCOUNTER — Ambulatory Visit: Payer: Medicaid Other | Admitting: Urology

## 2023-01-17 VITALS — BP 107/69 | HR 65 | Ht 70.0 in | Wt 228.0 lb

## 2023-01-17 DIAGNOSIS — N3281 Overactive bladder: Secondary | ICD-10-CM

## 2023-01-17 DIAGNOSIS — R3129 Other microscopic hematuria: Secondary | ICD-10-CM

## 2023-01-17 LAB — URINALYSIS, COMPLETE
Bilirubin, UA: NEGATIVE
Glucose, UA: NEGATIVE
Ketones, UA: NEGATIVE
Leukocytes,UA: NEGATIVE
Nitrite, UA: NEGATIVE
Protein,UA: NEGATIVE
Specific Gravity, UA: 1.015 (ref 1.005–1.030)
Urobilinogen, Ur: 0.2 mg/dL (ref 0.2–1.0)
pH, UA: 6.5 (ref 5.0–7.5)

## 2023-01-17 LAB — MICROSCOPIC EXAMINATION

## 2023-01-17 LAB — BLADDER SCAN AMB NON-IMAGING: Scan Result: 15

## 2023-01-17 MED ORDER — GEMTESA 75 MG PO TABS: 75.0000 mg | ORAL_TABLET | Freq: Every day | ORAL | 3 refills | Status: DC

## 2023-01-20 ENCOUNTER — Telehealth: Payer: Self-pay | Admitting: Family Medicine

## 2023-01-20 ENCOUNTER — Telehealth (INDEPENDENT_AMBULATORY_CARE_PROVIDER_SITE_OTHER): Payer: Self-pay | Admitting: *Deleted

## 2023-01-20 LAB — CULTURE, URINE COMPREHENSIVE

## 2023-01-20 NOTE — Telephone Encounter (Signed)
LMOM for patient to return call.

## 2023-01-20 NOTE — Telephone Encounter (Signed)
-----   Message from Nori Riis, PA-C sent at 01/20/2023  8:13 AM EDT ----- Please let Mrs. Model know that her urine culture was negative for infection, so we do not need an antibiotic at this time.  We do need to see her in six weeks for OAB, PVR and recheck on her urine.  She had microscopic blood in her specimen and we need to make sure it does not persist.

## 2023-01-20 NOTE — Telephone Encounter (Signed)
Phone call to patient this morning to check the status of her Ozempic medication since we still had a Prior authorization in cover my meds. Per Patient she received her medication and got a 3 month supply.

## 2023-01-21 ENCOUNTER — Encounter: Payer: Self-pay | Admitting: Family Medicine

## 2023-01-21 NOTE — Telephone Encounter (Signed)
Unable to reach patient. Mychart message sent and appointment made.

## 2023-01-22 NOTE — Progress Notes (Signed)
Chief Complaint:   OBESITY Jane [name] is here to discuss her progress with her obesity treatment plan along with follow-up of her obesity related diagnoses. Jane [name] is on the Category 4 Plan and states she is following her eating plan approximately 75% of the time. Jane [name] states she is gym, walking 10,000 steps 360 minutes 3-7 times per week.  Today's visit was #: 24 Starting weight: 283 lbs Starting date: 03/29/2020 Today's weight: 228 lbs Today's date: 01/13/2022 Total lbs lost to date: 55 lbs Total lbs lost since last in-office visit: 0  Interim History:  Patient is working on getting in 10k steps daily and started going to the gym 3x a week.  She has been to the gym for the last week and a half.  She does a bit of cardio and then focuses on muscle group activation in the form of resistance training.  Food wise she is doing around 75% on plan.  She did do some emotional eating for about 10 days due to a depressive episode.  Her prozac was recently increased to 40mg  twice a day.    Subjective:   1. Type 2 diabetes mellitus with hyperglycemia, without long-term current use of insulin (Jane [name]) Patient was on semaglutide.  Patient is not on metformin.  Last [name] was 5.9.  2. Hyperlipidemia associated with type 2 diabetes mellitus (Jane [name]) Patient is on Lipitor daily.  No side effects noted.  Assessment/Plan:   1. Type 2 diabetes mellitus with hyperglycemia, without long-term current use of insulin (Jane [name]) Continue semaglutide weight no refills today.  Decrease to 36 clicks to get half dose.  2. Hyperlipidemia associated with type 2 diabetes mellitus (Jane [name]) Continue Lipitor no change in dose.  3. BMI 32.0-32.9,adult  4. Obesity with starting BMI of 43.0 Jane [name] is currently in the action stage of change. As such, her goal is to continue with weight loss efforts. She has agreed to the Category 4 Plan.   Exercise goals:  As is.   Behavioral modification strategies: increasing lean protein  intake, meal planning and cooking strategies, keeping healthy foods in the home, and planning for success.  Jane [name] has agreed to follow-up with our clinic in 4-5 weeks. She was informed of the importance of frequent follow-up visits to maximize her success with intensive lifestyle modifications for her multiple health conditions.   Objective:   Blood pressure 109/64, pulse 65, temperature 97.9 F (36.6 C), height 5\' 10"  (1.778 m), weight 228 lb (103.4 kg), SpO2 99 %. Body mass index is 32.71 kg/m.  General: Cooperative, alert, well developed, in no acute distress. HEENT: Conjunctivae and lids unremarkable. Cardiovascular: Regular rhythm.  Lungs: Normal work of breathing. Neurologic: No focal deficits.   Lab Results  Component Value Date   CREATININE 1.40 (H) 11/07/2022   BUN 33 (H) 11/07/2022   NA 143 11/07/2022   K 4.3 11/07/2022   CL 104 11/07/2022   CO2 23 11/07/2022   Lab Results  Component Value Date   ALT 16 11/07/2022   AST 12 11/07/2022   ALKPHOS 102 11/07/2022   BILITOT 0.3 11/07/2022   Lab Results  Component Value Date   HGBA1C 5.9 (H) 11/07/2022   HGBA1C 5.8 (A) 04/22/2022   HGBA1C 6.2 (H) 11/06/2021   HGBA1C 6.3 (H) 03/22/2021   HGBA1C 5.8 (A) 10/24/2020   Lab Results  Component Value Date   INSULIN 21.5 03/29/2020   Lab Results  Component Value Date   TSH 1.440 11/07/2022   Lab Results  Component Value Date   CHOL 187 11/07/2022   HDL 33 (L) 11/07/2022   LDLCALC 105 (H) 11/07/2022   TRIG 285 (H) 11/07/2022   CHOLHDL 5.7 (H) 11/07/2022   Lab Results  Component Value Date   VD25OH 47.10 02/21/2022   VD25OH 50.60 01/01/2021   VD25OH 45.30 10/17/2020   Lab Results  Component Value Date   WBC 6.5 10/01/2022   HGB 12.4 10/01/2022   HCT 37.8 10/01/2022   MCV 95.0 10/01/2022   PLT 136 (L) 10/01/2022   Lab Results  Component Value Date   IRON 35 10/01/2022   TIBC 306 10/01/2022   FERRITIN 89 10/01/2022   Attestation Statements:    Reviewed by clinician on day of visit: allergies, medications, problem list, medical history, surgical history, family history, social history, and previous encounter notes.  I, Davy Pique, RMA, am acting as transcriptionist for Coralie Common, MD.  I have reviewed the above documentation for accuracy and completeness, and I agree with the above. - Coralie Common, MD

## 2023-01-29 ENCOUNTER — Ambulatory Visit (INDEPENDENT_AMBULATORY_CARE_PROVIDER_SITE_OTHER): Payer: Medicaid Other

## 2023-01-29 DIAGNOSIS — Z86718 Personal history of other venous thrombosis and embolism: Secondary | ICD-10-CM | POA: Diagnosis not present

## 2023-01-29 LAB — POCT INR
INR: 2.3 (ref 2.0–3.0)
PT: 27.4

## 2023-01-29 NOTE — Patient Instructions (Signed)
Description   5 mg daily except 7.5 mg on Wed and Sun. F/U in 2 weeks.     

## 2023-01-30 ENCOUNTER — Emergency Department: Payer: Medicaid Other

## 2023-01-30 ENCOUNTER — Emergency Department
Admission: EM | Admit: 2023-01-30 | Discharge: 2023-01-30 | Disposition: A | Discharge status: Home / Self Care | Payer: Medicaid Other | Attending: Emergency Medicine | Admitting: Emergency Medicine

## 2023-01-30 ENCOUNTER — Telehealth (HOSPITAL_COMMUNITY): Payer: No Typology Code available for payment source | Admitting: Psychiatry

## 2023-01-30 ENCOUNTER — Encounter: Payer: Self-pay | Admitting: Intensive Care

## 2023-01-30 ENCOUNTER — Other Ambulatory Visit: Payer: Self-pay

## 2023-01-30 ENCOUNTER — Encounter (HOSPITAL_COMMUNITY): Payer: Self-pay

## 2023-01-30 DIAGNOSIS — R202 Paresthesia of skin: Secondary | ICD-10-CM | POA: Insufficient documentation

## 2023-01-30 DIAGNOSIS — I129 Hypertensive chronic kidney disease with stage 1 through stage 4 chronic kidney disease, or unspecified chronic kidney disease: Secondary | ICD-10-CM | POA: Diagnosis not present

## 2023-01-30 DIAGNOSIS — R55 Syncope and collapse: Secondary | ICD-10-CM | POA: Insufficient documentation

## 2023-01-30 DIAGNOSIS — N189 Chronic kidney disease, unspecified: Secondary | ICD-10-CM | POA: Diagnosis not present

## 2023-01-30 DIAGNOSIS — E1122 Type 2 diabetes mellitus with diabetic chronic kidney disease: Secondary | ICD-10-CM | POA: Diagnosis not present

## 2023-01-30 DIAGNOSIS — R200 Anesthesia of skin: Secondary | ICD-10-CM | POA: Diagnosis present

## 2023-01-30 LAB — URINALYSIS, ROUTINE W REFLEX MICROSCOPIC
Bacteria, UA: NONE SEEN
Bilirubin Urine: NEGATIVE
Glucose, UA: NEGATIVE mg/dL
Ketones, ur: NEGATIVE mg/dL
Leukocytes,Ua: NEGATIVE
Nitrite: NEGATIVE
Protein, ur: NEGATIVE mg/dL
Specific Gravity, Urine: 1.01 (ref 1.005–1.030)
pH: 7 (ref 5.0–8.0)

## 2023-01-30 LAB — BASIC METABOLIC PANEL
Anion gap: 11 (ref 5–15)
BUN: 34 mg/dL — ABNORMAL HIGH (ref 6–20)
CO2: 27 mmol/L (ref 22–32)
Calcium: 9.4 mg/dL (ref 8.9–10.3)
Chloride: 99 mmol/L (ref 98–111)
Creatinine, Ser: 1.38 mg/dL — ABNORMAL HIGH (ref 0.44–1.00)
GFR, Estimated: 45 mL/min — ABNORMAL LOW (ref >60)
Glucose, Bld: 113 mg/dL — ABNORMAL HIGH (ref 70–99)
Potassium: 3.5 mmol/L (ref 3.5–5.1)
Sodium: 137 mmol/L (ref 135–145)

## 2023-01-30 LAB — CBC
HCT: 38.7 % (ref 36.0–46.0)
Hemoglobin: 13 g/dL (ref 12.0–15.0)
MCH: 30.8 pg (ref 26.0–34.0)
MCHC: 33.6 g/dL (ref 30.0–36.0)
MCV: 91.7 fL (ref 80.0–100.0)
Platelets: 118 10*3/uL — ABNORMAL LOW (ref 150–400)
RBC: 4.22 MIL/uL (ref 3.87–5.11)
RDW: 13.1 % (ref 11.5–15.5)
WBC: 5 10*3/uL (ref 4.0–10.5)
nRBC: 0 % (ref 0.0–0.2)

## 2023-01-30 LAB — TROPONIN I (HIGH SENSITIVITY): Troponin I (High Sensitivity): 4 ng/L (ref <18)

## 2023-01-30 LAB — POC URINE PREG, ED: Preg Test, Ur: NEGATIVE

## 2023-01-30 MED ORDER — DIAZEPAM 5 MG/ML IJ SOLN
5.0000 mg | Freq: Once | INTRAMUSCULAR | Status: AC
  Administered 2023-01-30: 5 mg via INTRAVENOUS
  Filled 2023-01-30: qty 2

## 2023-01-30 MED ORDER — MORPHINE SULFATE (PF) 4 MG/ML IV SOLN
4.0000 mg | Freq: Once | INTRAVENOUS | Status: AC
  Administered 2023-01-30: 4 mg via INTRAVENOUS
  Filled 2023-01-30: qty 1

## 2023-01-30 MED ORDER — CYCLOBENZAPRINE HCL 5 MG PO TABS: 5.0000 mg | ORAL_TABLET | Freq: Three times a day (TID) | ORAL | 0 refills | Status: DC | PRN

## 2023-01-30 MED ORDER — ONDANSETRON HCL 4 MG/2ML IJ SOLN
4.0000 mg | Freq: Once | INTRAMUSCULAR | Status: AC
  Administered 2023-01-30: 4 mg via INTRAVENOUS
  Filled 2023-01-30: qty 2

## 2023-01-30 MED ORDER — ONDANSETRON 4 MG PO TBDP: 4.0000 mg | ORAL_TABLET | Freq: Three times a day (TID) | ORAL | 0 refills | Status: DC | PRN

## 2023-01-30 MED ORDER — METHYLPREDNISOLONE SODIUM SUCC 125 MG IJ SOLR
125.0000 mg | Freq: Once | INTRAMUSCULAR | Status: AC
  Administered 2023-01-30: 125 mg via INTRAVENOUS
  Filled 2023-01-30: qty 2

## 2023-01-30 NOTE — ED Provider Notes (Signed)
[facility] Emergency Department Provider Note     Event Date/Time   First MD Initiated Contact with Patient 01/30/23 1231     (approximate)   History   Numbness   HPI  Jane [name] is a 57 y.o. female patient with a history of OCD, CKD, HTN, diabetes, HLD, presents to the ED from [facility].  Patient was there for routine follow-up visit with her primary provider.  She describes a near syncopal episode, where she was checking at a desk patient began to experience bilateral blurry vision, weakness, and as if she was going to pass out.  Patient reports she did not fall asleep at all to support at the desk.  She presents to the ED noting generalized numbness and tingling to the body.  She also reports a sharp pain to the top of the head.   Physical Exam   Triage Vital Signs: ED Triage Vitals [01/30/23 1153]  Enc Vitals Group     BP 139/80     Pulse Rate 61     Resp 16     Temp 98 F (36.7 C)     Temp Source Oral     SpO2 100 %     Weight 229 lb (103.9 kg)     Height 5\' 10"  (1.778 m)     Head Circumference      Peak Flow      Pain Score 9     Pain Loc      Pain Edu?      Excl. in [place]?     Most recent vital signs: Vitals:   01/30/23 1700 01/30/23 1810  BP: 109/72 112/65  Pulse:  69  Resp:  18  Temp:  98.4 F (36.9 C)  SpO2:  97%    General Awake, no distress. NAD HEENT NCAT. PERRL. EOMI. No rhinorrhea. Mucous membranes are moist.  CV:  Good peripheral perfusion.  RESP:  Normal effort.  ABD:  No distention.  MSK:  Normal spinal alignment without midline tenderness, spasm, vomiting, or step-off.  Normal active range of motion of upper and lower extremities bilaterally. NEURO: Cranial nerves II to XII grossly intact.  Normal LE DTRs bilaterally.   ED Results / Procedures / Treatments   Labs (all labs ordered are listed, but only abnormal results are displayed) Labs Reviewed  BASIC METABOLIC PANEL - Abnormal; Notable for the following  components:      Result Value   Glucose, Bld 113 (*)    BUN 34 (*)    Creatinine, Ser 1.38 (*)    GFR, Estimated 45 (*)    All other components within normal limits  CBC - Abnormal; Notable for the following components:   Platelets 118 (*)    All other components within normal limits  URINALYSIS, ROUTINE W REFLEX MICROSCOPIC - Abnormal; Notable for the following components:   Color, Urine YELLOW (*)    APPearance CLEAR (*)    Hgb urine dipstick SMALL (*)    All other components within normal limits  POC URINE PREG, ED  CBG MONITORING, ED  TROPONIN I (HIGH SENSITIVITY)     EKG  Vent. rate 62 BPM PR interval 150 ms QRS duration 88 ms QT/QTcB 416/422 ms P-R-T axes * -50 -33 Sinus rhythm with PVCs Left fascicular block  RADIOLOGY  I personally viewed and evaluated these images as part of my medical decision making, as well as reviewing the written report by the radiologist.  ED Provider Interpretation:  no acute findings  MR BRAIN WO CONTRAST  Result Date: 01/30/2023 CLINICAL DATA:  Numbness and tingling EXAM: MRI HEAD WITHOUT CONTRAST TECHNIQUE: Multiplanar, multiecho pulse sequences of the brain and surrounding structures were obtained without intravenous contrast. COMPARISON:  11/27/2022 MRI FINDINGS: Brain: No restricted diffusion to suggest acute or subacute infarct. No acute hemorrhage, mass, mass effect, or midline shift. No hydrocephalus or extra-axial collection. Partial empty sella. Normal craniocervical junction. No hemosiderin deposition to suggest remote hemorrhage. Normal cerebral volume for age. Vascular: Normal arterial flow voids. Skull and upper cervical spine: Normal marrow signal. Sinuses/Orbits: Clear paranasal sinuses. No acute finding in the orbits. Other: The mastoid air cells are well aerated. IMPRESSION: No acute intracranial process. No evidence of acute or subacute infarct. Electronically Signed   By: Merilyn Baba M.D.   On: 01/30/2023 15:35   CT HEAD  WO CONTRAST  Result Date: 01/30/2023 CLINICAL DATA:  Diplopia. Blurry vision and near syncope. Weakness. EXAM: CT HEAD WITHOUT CONTRAST TECHNIQUE: Contiguous axial images were obtained from the base of the skull through the vertex without intravenous contrast. RADIATION DOSE REDUCTION: This exam was performed according to the departmental dose-optimization program which includes automated exposure control, adjustment of the mA and/or kV according to patient size and/or use of iterative reconstruction technique. COMPARISON:  MR brain 11/27/2022 FINDINGS: Brain: No evidence of acute infarction, hemorrhage, hydrocephalus, extra-axial collection or mass lesion/mass effect. Vascular: No hyperdense vessel or unexpected calcification. Skull: Normal. Negative for fracture or focal lesion. Sinuses/Orbits: No acute finding. Other: None. IMPRESSION: No acute intracranial pathology. Electronically Signed   By: Kerby Moors M.D.   On: 01/30/2023 12:17     PROCEDURES:  Critical Care performed: No  Procedures   MEDICATIONS ORDERED IN ED: Medications  ondansetron (ZOFRAN) injection 4 mg (4 mg Intravenous Given 01/30/23 1329)  morphine (PF) 4 MG/ML injection 4 mg (4 mg Intravenous Given 01/30/23 1329)  methylPREDNISolone sodium succinate (SOLU-MEDROL) 125 mg/2 mL injection 125 mg (125 mg Intravenous Given 01/30/23 1635)  diazepam (VALIUM) injection 5 mg (5 mg Intravenous Given 01/30/23 1635)     IMPRESSION / MDM / ASSESSMENT AND PLAN / ED COURSE  I reviewed the triage vital signs and the nursing notes.                              Differential diagnosis includes, but is not limited to, hypotension, dehydration, vasovagal reaction, trite abnormality, alcohol, illicit or prescription medications, or other toxic ingestion; intracranial pathology such as stroke or intracerebral hemorrhage; fever or infectious causes including sepsis; uremia; trauma; hypoglycemia, and thyroid-related diseases;  etc.  Patient's  presentation is most consistent with acute complicated illness / injury requiring diagnostic workup.  Patient's diagnosis is consistent with near syncope with paresthesias.  Patient with reassuring exam and workup overall.  No red flags on exam.  No signs of any acute neuromuscular deficit noted.  No signs of hypoglycemia, or critical anemia, electrolyte abnormalities, or hypertension.  No acute findings on her head CT or MRI.  Patient with improvement of her symptoms after.  Patient will be discharged home with prescriptions for Zofran and Flexeril. Patient is to follow up with PCP as needed or otherwise directed. Patient is given ED precautions to return to the ED for any worsening or new symptoms.  FINAL CLINICAL IMPRESSION(S) / ED DIAGNOSES   Final diagnoses:  Numbness  Paresthesias  Near syncope     Rx / DC Orders  ED Discharge Orders          Ordered    cyclobenzaprine (FLEXERIL) 5 MG tablet  3 times daily PRN        01/30/23 1807    ondansetron (ZOFRAN-ODT) 4 MG disintegrating tablet  Every 8 hours PRN        01/30/23 1807             Note:  This document was prepared using Dragon voice recognition software and may include unintentional dictation errors.    Melvenia Needles, PA-C 01/30/23 2016    Duffy Bruce, MD 01/30/23 [phone]

## 2023-01-30 NOTE — ED Triage Notes (Signed)
Patient reports going to [facility] this AM for thyroid appointment and when checking in at the desk she started having bilateral blurry vision, feeling of near syncope, and weakness. Reports she did not fall and was able to hold on to the desk.   Also c/o feeling numbness/tingling down both sides of her body. C/o sharp pain on the top of her head. Reports shakiness "inside" her body

## 2023-01-30 NOTE — ED Triage Notes (Addendum)
Pt here from [facility] clinic with numbness and tingling "all over". Pt currently texting on the phone in [place].

## 2023-01-30 NOTE — ED Notes (Signed)
Meds given  siderails up x 2   friend with pt.

## 2023-01-30 NOTE — Discharge Instructions (Signed)
Your exam, labs, EKG, CT scan and MRI are normal and reassuring at this time.  No signs of any serious underlying infection, brain injury or bleed.  No indication for your transient symptoms.  You should follow-up with primary provider for ongoing symptom management.  Return to the ED if needed.

## 2023-02-12 ENCOUNTER — Ambulatory Visit (INDEPENDENT_AMBULATORY_CARE_PROVIDER_SITE_OTHER): Payer: Medicaid Other

## 2023-02-12 DIAGNOSIS — Z86718 Personal history of other venous thrombosis and embolism: Secondary | ICD-10-CM | POA: Diagnosis not present

## 2023-02-12 LAB — POCT INR
INR: 2.7 (ref 2.0–3.0)
POC INR: 2.7
PT: 32.5

## 2023-02-12 NOTE — Patient Instructions (Signed)
Description   5 mg daily F/U in 4 weeks     

## 2023-02-13 ENCOUNTER — Other Ambulatory Visit (HOSPITAL_COMMUNITY): Payer: Self-pay | Admitting: Psychiatry

## 2023-02-14 ENCOUNTER — Telehealth (INDEPENDENT_AMBULATORY_CARE_PROVIDER_SITE_OTHER): Payer: No Typology Code available for payment source | Admitting: Psychiatry

## 2023-02-14 ENCOUNTER — Encounter (HOSPITAL_COMMUNITY): Payer: Self-pay | Admitting: Psychiatry

## 2023-02-14 DIAGNOSIS — F5105 Insomnia due to other mental disorder: Secondary | ICD-10-CM

## 2023-02-14 DIAGNOSIS — F900 Attention-deficit hyperactivity disorder, predominantly inattentive type: Secondary | ICD-10-CM | POA: Diagnosis not present

## 2023-02-14 DIAGNOSIS — F3130 Bipolar disorder, current episode depressed, mild or moderate severity, unspecified: Secondary | ICD-10-CM | POA: Diagnosis not present

## 2023-02-14 MED ORDER — ALPRAZOLAM 1 MG PO TABS: 1.0000 mg | ORAL_TABLET | Freq: Two times a day (BID) | ORAL | 2 refills | Status: DC | PRN

## 2023-02-14 MED ORDER — AMPHETAMINE-DEXTROAMPHETAMINE 30 MG PO TABS: 30.0000 mg | ORAL_TABLET | Freq: Two times a day (BID) | ORAL | 0 refills | Status: DC

## 2023-02-14 MED ORDER — AMPHETAMINE-DEXTROAMPHETAMINE 30 MG PO TABS: 1.0000 | ORAL_TABLET | Freq: Two times a day (BID) | ORAL | 0 refills | Status: DC

## 2023-02-14 MED ORDER — FLUOXETINE HCL 40 MG PO CAPS: 40.0000 mg | ORAL_CAPSULE | Freq: Two times a day (BID) | ORAL | 2 refills | Status: DC

## 2023-02-14 MED ORDER — TRAZODONE HCL 150 MG PO TABS: 150.0000 mg | ORAL_TABLET | Freq: Every day | ORAL | 2 refills | Status: DC

## 2023-02-14 NOTE — Progress Notes (Signed)
Virtual Visit via Video Note  I connected with Jane [name] on 02/14/23 at 10:20 AM EDT by a video enabled telemedicine application and verified that I am speaking with the correct person using two identifiers.  Location: Patient: home Provider: office   I discussed the limitations of evaluation and management by telemedicine and the availability of in person appointments. The patient expressed understanding and agreed to proceed.    I discussed the assessment and treatment plan with the patient. The patient was provided an opportunity to ask questions and all were answered. The patient agreed with the plan and demonstrated an understanding of the instructions.   The patient was advised to call back or seek an in-person evaluation if the symptoms worsen or if the condition fails to improve as anticipated.  I provided 15 minutes of non-face-to-face time during this encounter.   Diannia Ruder, MD  [facility] MD/PA/NP OP Progress Note  02/14/2023 10:41 AM Jane [name]  MRN:  [id number]  Chief Complaint:  Chief Complaint  Patient presents with   ADD   Anxiety   Depression   Follow-up   HPI: This patient is a 57 year old divorced white female who lives alone in [place].  She is on disability due to bipolar disorder.  The patient returns for follow-up after 6 weeks.  Last time she had gotten more depressed so increased her Prozac to 80 mg daily.  She seems to be doing better.  She is also reconnected with her youngest daughter and 2 or 3 grandchildren.  She states that daughter does "nothing but curse and yell at them all the time."  Consequently the children are acting out a lot.  She is trying to spend some time with the children away from the daughter.  The patient states that since we increase the Prozac her mood has improved.  She is getting out and doing more things.  She denies thoughts of self-harm or suicide.  She is focusing well with the Adderall. Visit Diagnosis:    ICD-10-CM    1. Bipolar I disorder, most recent episode depressed  F31.30     2. Attention deficit hyperactivity disorder (ADHD), predominantly inattentive type  F90.0     3. Insomnia due to mental disorder  F51.05       Past Psychiatric History: Long-term outpatient treatment  Past Medical History:  Past Medical History:  Diagnosis Date   ADD (attention deficit disorder)    Anxiety    Back pain    Bilateral swelling of feet    Bipolar 1 disorder    Bipolar 1 disorder    Bipolar disorder    Chewing difficulty    Chronic fatigue syndrome    Chronic kidney disease    Stage 3 kidney disease;dx by Dr. Kari Baars.    Constipation    Depression    Diabetes mellitus without complication    diet controlled   Dyspnea    with exertion   GERD (gastroesophageal reflux disease)    Headache    migraines   High cholesterol    History of blood clots    History of DVT (deep vein thrombosis)    left leg   Hypertension    states under control with meds., has been on med. x 2 years   Hypothyroidism    Joint pain    Neuropathy    Obsessive-compulsive disorder    Peripheral vascular disease    Prediabetes    Respiratory failure requiring intubation    Restless leg syndrome  Shortness of breath    Sleep apnea    Thrombocytopenia 09/15/2019   Trigger thumb of left hand 01/2018   Trigger thumb of right hand     Past Surgical History:  Procedure Laterality Date   ABDOMINAL HYSTERECTOMY  06/2016   complete   APPLICATION OF WOUND VAC Left 04/27/2019   Procedure: APPLICATION OF WOUND VAC LEFT GROIN;  Surgeon: Maeola Harman, MD;  Location: [facility] OR;  Service: Vascular;  Laterality: Left;   AV FISTULA PLACEMENT Left 11/24/2018   Procedure: ARTERIOVENOUS (AV) FISTULA CREATION LEFT SFA TO LEFT FEMORAL VEIN;  Surgeon: Maeola Harman, MD;  Location: [facility] OR;  Service: Vascular;  Laterality: Left;   BACK SURGERY     CHOLECYSTECTOMY     COLONOSCOPY WITH PROPOFOL N/A 11/22/2019    Procedure: COLONOSCOPY WITH PROPOFOL;  Surgeon: Wyline Mood, MD;  Location: [facility] ENDOSCOPY;  Service: Gastroenterology;  Laterality: N/A;   COLONOSCOPY WITH PROPOFOL N/A 12/09/2022   Procedure: COLONOSCOPY WITH PROPOFOL;  Surgeon: Wyline Mood, MD;  Location: [facility] ENDOSCOPY;  Service: Gastroenterology;  Laterality: N/A;   FEMORAL ARTERY EXPLORATION  03/30/2019   Procedure: Left Common Femoral Artery and Vein Exploration;  Surgeon: Maeola Harman, MD;  Location: [facility] OR;  Service: Vascular;;   FEMORAL-FEMORAL BYPASS GRAFT Left 11/24/2018   Procedure: BYPASS GRAFT FEMORAL-FEMORAL VENOUS LEFT TO RIGHT PALMA PROCEDURE USING CRYOVEIN;  Surgeon: Maeola Harman, MD;  Location: [facility] OR;  Service: Vascular;  Laterality: Left;   GROIN DEBRIDEMENT Left 04/27/2019   Procedure: GROIN DEBRIDEMENT;  Surgeon: Maeola Harman, MD;  Location: [facility] OR;  Service: Vascular;  Laterality: Left;   INSERTION OF ILIAC STENT  03/30/2019   Procedure: Stent of left common, external iliac veins and left common femoral vein;  Surgeon: Maeola Harman, MD;  Location: [facility] OR;  Service: Vascular;;   KNEE ARTHROSCOPY WITH MENISCAL REPAIR Left 11/14/2020   Procedure: LEFT KNEE ARTHROSCOPY WITH PARTIAL MEDIAL MENISCECTOMY;  Surgeon: Vickki Hearing, MD;  Location: [facility] ORS;  Service: Orthopedics;  Laterality: Left;   LOWER EXTREMITY VENOGRAPHY N/A 08/17/2018   Procedure: LOWER EXTREMITY VENOGRAPHY - Central Venogram;  Surgeon: Maeola Harman, MD;  Location: [facility] INVASIVE CV LAB;  Service: Cardiovascular;  Laterality: N/A;   LOWER EXTREMITY VENOGRAPHY Bilateral 03/09/2019   Procedure: LOWER EXTREMITY VENOGRAPHY;  Surgeon: Maeola Harman, MD;  Location: [facility] INVASIVE CV LAB;  Service: Cardiovascular;  Laterality: Bilateral;   LOWER EXTREMITY VENOGRAPHY Left 08/16/2019   Procedure: LOWER EXTREMITY VENOGRAPHY;  Surgeon: Maeola Harman, MD;  Location: [facility] INVASIVE CV LAB;  Service:  Cardiovascular;  Laterality: Left;   LOWER EXTREMITY VENOGRAPHY Left 03/25/2022   Procedure: LOWER EXTREMITY VENOGRAPHY;  Surgeon: Maeola Harman, MD;  Location: [facility] INVASIVE CV LAB;  Service: Cardiovascular;  Laterality: Left;  IVUS   LUMBAR FUSION  11/21/2000   L5-S1   LUMBAR SPINE SURGERY     x 2 others   PATCH ANGIOPLASTY Left 03/30/2019   Procedure: Patch Angioplasty of the Left Common Femoral Vein using Venosure Biologic patch;  Surgeon: Maeola Harman, MD;  Location: [facility] OR;  Service: Vascular;  Laterality: Left;   PERIPHERAL VASCULAR INTERVENTION Left 08/16/2019   Procedure: PERIPHERAL VASCULAR INTERVENTION;  Surgeon: Maeola Harman, MD;  Location: [facility] INVASIVE CV LAB;  Service: Cardiovascular;  Laterality: Left;  common femoral/femoral vein stent   PERIPHERAL VASCULAR INTERVENTION Left 03/25/2022   Procedure: PERIPHERAL VASCULAR INTERVENTION;  Surgeon: Maeola Harman, MD;  Location: [facility] INVASIVE CV LAB;  Service: Cardiovascular;  Laterality: Left;  COMMON FEMORAL VEIN   PERIPHERAL VASCULAR THROMBECTOMY Left 03/25/2022   Procedure: PERIPHERAL VASCULAR THROMBECTOMY;  Surgeon: Maeola Harman, MD;  Location: [facility] INVASIVE CV LAB;  Service: Cardiovascular;  Laterality: Left;  EXTREMITY/IVC   TRIGGER FINGER RELEASE Right 12/01/2017   Procedure: RELEASE TRIGGER FINGER/A-1 PULLEY RIGHT THUMB;  Surgeon: Betha Loa, MD;  Location: [facility];  Service: Orthopedics;  Laterality: Right;   TRIGGER FINGER RELEASE Left 01/26/2018   Procedure: LEFT TRIGGER THUMB RELEASE;  Surgeon: Betha Loa, MD;  Location: [facility];  Service: Orthopedics;  Laterality: Left;   ULTRASOUND GUIDANCE FOR VASCULAR ACCESS Right 03/30/2019   Procedure: Ultrasound-guided cannulation right internal jugular vein;  Surgeon: Maeola Harman, MD;  Location: [facility] OR;  Service: Vascular;  Laterality: Right;    Family Psychiatric History: see  below  Family History:  Family History  Problem Relation Age of Onset   Heart disease Mother    Hyperlipidemia Mother    Hypertension Mother    Bipolar disorder Mother    Stroke Mother    Depression Mother    Sleep apnea Mother    Obesity Mother    Diabetes Father    Heart disease Father    Hyperlipidemia Father    Hypertension Father    Sleep apnea Father    Obesity Father    Drug abuse Daughter    ADD / ADHD Daughter    Drug abuse Daughter    Anxiety disorder Daughter    Bipolar disorder Daughter    Hypertension Sister    Hypertension Brother    Hyperlipidemia Brother    Heart disease Brother    Bipolar disorder Maternal Aunt    Suicidality Maternal Aunt     Social History:  Social History   Socioeconomic History   Marital status: Divorced    Spouse name: Not on file   Number of children: 3   Years of education: Not on file   Highest education level: Not on file  Occupational History   Not on file  Tobacco Use   Smoking status: Never   Smokeless tobacco: Never  Vaping Use   Vaping Use: Never used  Substance and Sexual Activity   Alcohol use: No   Drug use: No   Sexual activity: Not Currently    Partners: Male    Birth control/protection: Surgical  Other Topics Concern   Not on file  Social History Narrative   Not on file   Social Determinants of Health   Financial Resource Strain: High Risk (10/25/2020)   Overall Financial Resource Strain (CARDIA)    Difficulty of Paying Living Expenses: Hard  Food Insecurity: Food Insecurity Present (05/27/2022)   Hunger Vital Sign    Worried About Running Out of Food in the Last Year: Sometimes true    Ran Out of Food in the Last Year: Sometimes true  Transportation Needs: No Transportation Needs (05/27/2022)   PRAPARE - Administrator, Civil Service (Medical): No    Lack of Transportation (Non-Medical): No  Physical Activity: Inactive (10/25/2020)   Exercise Vital Sign    Days of Exercise per  Week: 0 days    Minutes of Exercise per Session: 0 min  Stress: Stress Concern Present (10/25/2020)   [employer] of Occupational Health - Occupational Stress Questionnaire    Feeling of Stress : Very much  Social Connections: Socially Isolated (10/25/2020)   Social Connection and Isolation Panel [NHANES]  Frequency of Communication with Friends and Family: More than three times a week    Frequency of Social Gatherings with Friends and Family: Three times a week    Attends Religious Services: Never    Active Member of Clubs or Organizations: No    Attends Banker Meetings: Never    Marital Status: Divorced    Allergies:  Allergies  Allergen Reactions   Aripiprazole Other (See Comments)    BECOMES  VIOLENT    Seroquel [Quetiapine Fumarate] Other (See Comments)    BECOMES VIOLENT   Chlorpromazine Other (See Comments)    SEVERE ANXIETY    Gabapentin Other (See Comments)    NIGHTMARES    Prednisone Hives   Quetiapine     Metabolic Disorder Labs: Lab Results  Component Value Date   HGBA1C 5.9 (H) 11/07/2022   MPG 131.24 11/06/2021   MPG 134.11 03/26/2019   No results found for: "PROLACTIN" Lab Results  Component Value Date   CHOL 187 11/07/2022   TRIG 285 (H) 11/07/2022   HDL 33 (L) 11/07/2022   CHOLHDL 5.7 (H) 11/07/2022   VLDL 37 11/06/2021   LDLCALC 105 (H) 11/07/2022   LDLCALC 95 04/22/2022   Lab Results  Component Value Date   TSH 1.440 11/07/2022   TSH 1.420 08/06/2022    Therapeutic Level Labs: No results found for: "LITHIUM" Lab Results  Component Value Date   VALPROATE 61.2 03/23/2014   No results found for: "CBMZ"  Current Medications: Current Outpatient Medications  Medication Sig Dispense Refill   amphetamine-dextroamphetamine (ADDERALL) 30 MG tablet Take 1 tablet by mouth 2 (two) times daily. 60 tablet 0   ALPRAZolam (XANAX) 1 MG tablet Take 1 tablet (1 mg total) by mouth 2 (two) times daily as needed. for anxiety 60  tablet 2   amphetamine-dextroamphetamine (ADDERALL) 30 MG tablet Take 1 tablet by mouth 2 (two) times daily. 60 tablet 0   amphetamine-dextroamphetamine (ADDERALL) 30 MG tablet Take 1 tablet by mouth 2 (two) times daily. 60 tablet 0   aspirin EC 81 MG tablet Take 81 mg by mouth daily.     aspirin-acetaminophen-caffeine (EXCEDRIN MIGRAINE) 250-250-65 MG tablet Take 2 tablets by mouth every 6 (six) hours as needed for headache.     atorvastatin (LIPITOR) 40 MG tablet TAKE 1 TABLET BY MOUTH EVERY DAY 90 tablet 3   cetirizine (ZYRTEC) 10 MG tablet TAKE 1 TABLET BY MOUTH DAILY 90 tablet 0   colchicine 0.6 MG tablet TAKE 2 TABLETS, TAKE 1 TABLET BY MOUTH 1 hour AFTER, THEN TAKE 1 TABLET EVERY DAY FOR 7 DAYS 10 tablet 2   cyclobenzaprine (FLEXERIL) 5 MG tablet Take 1 tablet (5 mg total) by mouth 3 (three) times daily as needed. 15 tablet 0   famotidine (PEPCID) 20 MG tablet Take by mouth.     FLUoxetine (PROZAC) 40 MG capsule Take 1 capsule (40 mg total) by mouth 2 (two) times daily. 180 capsule 2   GARLIC PO Take 1 tablet by mouth daily.     hydrochlorothiazide (HYDRODIURIL) 25 MG tablet TAKE 1 TABLET BY MOUTH DAILY (Please schedule office visit BEFORE any future refills) 90 tablet 0   HYDROcodone-acetaminophen (NORCO) 10-325 MG tablet Take 1 tablet by mouth every 6 (six) hours as needed. To last 30 days from fill date 120 tablet 0   [START ON 02/24/2023] HYDROcodone-acetaminophen (NORCO) 10-325 MG tablet Take 1 tablet by mouth every 6 (six) hours as needed. To last 30 days from fill date 120 tablet  0   hydrOXYzine (ATARAX) 25 MG tablet Take by mouth.     ketoconazole (NIZORAL) 2 % shampoo Apply topically daily.     levothyroxine (SYNTHROID) 125 MCG tablet Take 125 mcg by mouth daily before breakfast.     LINZESS 145 MCG CAPS capsule TAKE ONE CAPSULE BY MOUTH DAILY 90 capsule 3   lisinopril (ZESTRIL) 5 MG tablet TAKE 1 TABLET BY MOUTH DAILY 90 tablet 1   methocarbamol (ROBAXIN) 750 MG tablet Take 1  tablet (750 mg total) by mouth 2 (two) times daily as needed for muscle spasms. 60 tablet 5   Multiple Vitamin (MULTIVITAMIN) capsule Take 1 capsule by mouth daily.     omega-3 acid ethyl esters (LOVAZA) 1 g capsule Take by mouth.     Omega-3 Fatty Acids (OMEGA-3 FISH OIL PO) Take 1 capsule by mouth daily.     ondansetron (ZOFRAN-ODT) 4 MG disintegrating tablet Take 1 tablet (4 mg total) by mouth every 8 (eight) hours as needed for nausea or vomiting. 15 tablet 0   pantoprazole (PROTONIX) 40 MG tablet TAKE 1 TABLET BY MOUTH DAILY 90 tablet 0   polyethylene glycol powder (GLYCOLAX/MIRALAX) 17 GM/SCOOP powder Take 17 g by mouth daily. 850 g 1   potassium chloride (KLOR-CON M) 10 MEQ tablet TAKE 1 TABLET BY MOUTH THREE TIMES DAILY 270 tablet 1   pregabalin (LYRICA) 100 MG capsule TAKE ONE CAPSULE BY MOUTH FOUR TIMES DAILY 120 capsule 1   rOPINIRole (REQUIP) 2 MG tablet TAKE 1 TABLET BY MOUTH TWICE DAILY 180 tablet 3   Semaglutide, 1 MG/DOSE, 4 MG/3ML SOPN Inject 1 mg as directed once a week. 9 mL 0   traZODone (DESYREL) 150 MG tablet Take 1 tablet (150 mg total) by mouth at bedtime. 30 tablet 2   triamcinolone cream (KENALOG) 0.1 % Apply to rash on lower legs once to twice daily as needed. Avoid face, groin, axilla. 80 g 2   valACYclovir (VALTREX) 1000 MG tablet Take 2 tablets by mouth at first onset of fever blisters, then take 2 tablets by mouth 12 hours later. 30 tablet 3   Vibegron (GEMTESA) 75 MG TABS Take 75 mg by mouth daily. 90 tablet 3   Vibegron (GEMTESA) 75 MG TABS Take 1 tablet (75 mg total) by mouth daily. 90 tablet 3   warfarin (COUMADIN) 5 MG tablet Take 1 tablet (5 mg total) by mouth daily. 60 tablet 3   No current facility-administered medications for this visit.     Musculoskeletal: Strength & Muscle Tone: within normal limits Gait & Station: normal Patient leans: N/A  Psychiatric Specialty Exam: Review of Systems  All other systems reviewed and are negative.   There  were no vitals taken for this visit.There is no height or weight on file to calculate BMI.  General Appearance: Casual and Fairly Groomed  Eye Contact:  Good  Speech:  Clear and Coherent  Volume:  Normal  Mood:  Euthymic  Affect:  Congruent  Thought Process:  Goal Directed  Orientation:  Full (Time, Place, and Person)  Thought Content: WDL   Suicidal Thoughts:  No  Homicidal Thoughts:  No  Memory:  Immediate;   Good Recent;   Good Remote;   Fair  Judgement:  Good  Insight:  Good  Psychomotor Activity:  Normal  Concentration:  Concentration: Good and Attention Span: Good  Recall:  Good  Fund of Knowledge: Good  Language: Good  Akathisia:  No  Handed:  Right  AIMS (if indicated): not  done  Assets: Communication skills, support  ADL's:  Intact  Cognition: WNL  Sleep:  Good   Screenings: PHQ2-9    Flowsheet Row Video Visit from 01/02/2023 in [facility] [place] Office Visit from 11/07/2022 in [facility] Office Visit from 09/12/2022 in [facility] Visit from 08/06/2022 in [facility] Video Visit from 07/01/2022 in [facility] Total Score 5 3 0 2 4  PHQ-9 Total Score 15 10 -- 9 10      Flowsheet Row ED from 01/30/2023 in [facility] Emergency Department at [facility] Video Visit from 01/02/2023 in [facility] [place] Admission (Discharged) from 12/09/2022 in [facility]  C-SSRS RISK CATEGORY No Risk No Risk No Risk        Assessment and Plan: This patient is a 57 year old female with a history of depression anxiety and ADD.  She is doing better on her current regimen.  She will continue Prozac 80 mg daily for depression, Xanax 1 mg twice daily for anxiety, Adderall 30 mg twice daily for ADD and trazodone 150  mg at bedtime as needed for sleep.  She will return to see me in 3 months  Collaboration of Care: Collaboration of Care: Primary Care Provider AEB notes will be shared with PCP at patient's request  Patient/Guardian was advised Release of Information must be obtained prior to any record release in order to collaborate their care with an outside provider. Patient/Guardian was advised if they have not already done so to contact the registration department to sign all necessary forms in order for [place] to release information regarding their care.   Consent: Patient/Guardian gives verbal consent for treatment and assignment of benefits for services provided during this visit. Patient/Guardian expressed understanding and agreed to proceed.    Diannia Ruder, MD 02/14/2023, 10:41 AM

## 2023-02-17 ENCOUNTER — Other Ambulatory Visit (HOSPITAL_COMMUNITY): Payer: Self-pay

## 2023-02-17 ENCOUNTER — Ambulatory Visit (INDEPENDENT_AMBULATORY_CARE_PROVIDER_SITE_OTHER): Payer: Medicaid Other | Admitting: Family Medicine

## 2023-02-17 ENCOUNTER — Encounter (INDEPENDENT_AMBULATORY_CARE_PROVIDER_SITE_OTHER): Payer: Self-pay | Admitting: Family Medicine

## 2023-02-17 ENCOUNTER — Other Ambulatory Visit: Payer: Self-pay

## 2023-02-17 VITALS — BP 90/52 | HR 59 | Temp 97.8°F | Ht 70.0 in | Wt 223.0 lb

## 2023-02-17 DIAGNOSIS — E1165 Type 2 diabetes mellitus with hyperglycemia: Secondary | ICD-10-CM

## 2023-02-17 DIAGNOSIS — R0602 Shortness of breath: Secondary | ICD-10-CM | POA: Diagnosis not present

## 2023-02-17 DIAGNOSIS — Z7985 Long-term (current) use of injectable non-insulin antidiabetic drugs: Secondary | ICD-10-CM

## 2023-02-17 DIAGNOSIS — K5909 Other constipation: Secondary | ICD-10-CM

## 2023-02-17 DIAGNOSIS — R5383 Other fatigue: Secondary | ICD-10-CM | POA: Diagnosis not present

## 2023-02-17 DIAGNOSIS — E669 Obesity, unspecified: Secondary | ICD-10-CM

## 2023-02-17 DIAGNOSIS — Z6832 Body mass index (BMI) 32.0-32.9, adult: Secondary | ICD-10-CM

## 2023-02-17 MED ORDER — SEMAGLUTIDE (1 MG/DOSE) 4 MG/3ML ~~LOC~~ SOPN
1.0000 mg | PEN_INJECTOR | SUBCUTANEOUS | 0 refills | Status: DC
  Filled 2023-02-17 – 2023-03-06 (×2): qty 9, 84d supply, fill #0

## 2023-02-18 NOTE — Progress Notes (Signed)
Chief Complaint:   OBESITY Jane [name] is here to discuss her progress with her obesity treatment plan along with follow-up of her obesity related diagnoses. Jane [name] is on the Category 4 Plan and states she is following her eating plan approximately 75% of the time. Jane [name] states she is gym in addition to 1,000 steps daily.  Today's visit was #: 37 Starting weight: 283 lbs Starting date: 03/29/2020 Today's weight: 223 lbs Today's date: 02/17/2023 Total lbs lost to date: 60 lbs Total lbs lost since last in-office visit: 5 lbs  Interim History: Patient has been feeling a bit off recently.  She isn't sure why but something isn't feeling normal.  Feels constipated.    Subjective:   1. SOBOE (shortness of breath on exertion) Patient IC today, 1570, previous IC 2635 on 03/29/2020.  Patient has lost 60 LBS since that time.  2. Other constipation Patient takes Linzess and MiraLAX and still struggling with constipation.  No recent change in medications.  3. Other fatigue Recent EKG with new SVC in comparison since last year.  Patient feeling much more fatigued than previously.  4. Type 2 diabetes mellitus with hyperglycemia, without long-term current use of insulin Patient is on Ozempic 4 mg.  No side effects noted.  Last [name] was 5.9.  Assessment/Plan:   1. SOBOE (shortness of breath on exertion) Check IC today of 1570.  Patient encouraged to increase resistance training and focus on protein intake.  2. Other constipation Follow-up with Dr. B for further management.  3. Other fatigue Referral- Ambulatory referral to Cardiology  4. Type 2 diabetes mellitus with hyperglycemia, without long-term current use of insulin Refill- Semaglutide, 1 MG/DOSE, 4 MG/3ML SOPN; Inject 1 mg as directed once a week.  Dispense: 9 mL; Refill: 0  5. BMI 32.0-32.9,adult  6. Obesity with starting BMI of 43.0 Jane [name] is currently in the action stage of change. As such, her goal is to continue with weight  loss efforts. She has agreed to the Category 3 Plan.   Exercise goals:  As is.   Behavioral modification strategies: increasing lean protein intake, meal planning and cooking strategies, keeping healthy foods in the home, and planning for success.  Jane [name] has agreed to follow-up with our clinic in 4 weeks. She was informed of the importance of frequent follow-up visits to maximize her success with intensive lifestyle modifications for her multiple health conditions.   Objective:   Blood pressure (!) 90/52, pulse (!) 59, temperature 97.8 F (36.6 C), height  (1.778 m), weight 223 lb (101.2 kg), SpO2 95 %. Body mass index is 32 kg/m.  General: Cooperative, alert, well developed, in no acute distress. HEENT: Conjunctivae and lids unremarkable. Cardiovascular: Regular rhythm.  Lungs: Normal work of breathing. Neurologic: No focal deficits.   Lab Results  Component Value Date   CREATININE 1.38 (H) 01/30/2023   BUN 34 (H) 01/30/2023   NA 137 01/30/2023   K 3.5 01/30/2023   CL 99 01/30/2023   CO2 27 01/30/2023   Lab Results  Component Value Date   ALT 16 11/07/2022   AST 12 11/07/2022   ALKPHOS 102 11/07/2022   BILITOT 0.3 11/07/2022   Lab Results  Component Value Date   HGBA1C 5.9 (H) 11/07/2022   HGBA1C 5.8 (A) 04/22/2022   HGBA1C 6.2 (H) 11/06/2021   HGBA1C 6.3 (H) 03/22/2021   HGBA1C 5.8 (A) 10/24/2020   Lab Results  Component Value Date   INSULIN 21.5 03/29/2020   Lab Results  Component Value Date   TSH 1.440 11/07/2022   Lab Results  Component Value Date   CHOL 187 11/07/2022   HDL 33 (L) 11/07/2022   LDLCALC 105 (H) 11/07/2022   TRIG 285 (H) 11/07/2022   CHOLHDL 5.7 (H) 11/07/2022   Lab Results  Component Value Date   VD25OH 47.10 02/21/2022   VD25OH 50.60 01/01/2021   VD25OH 45.30 10/17/2020   Lab Results  Component Value Date   WBC 5.0 01/30/2023   HGB 13.0 01/30/2023   HCT 38.7 01/30/2023   MCV 91.7 01/30/2023   PLT 118 (L) 01/30/2023    Lab Results  Component Value Date   IRON 35 10/01/2022   TIBC 306 10/01/2022   FERRITIN 89 10/01/2022   Attestation Statements:   Reviewed by clinician on day of visit: allergies, medications, problem list, medical history, surgical history, family history, social history, and previous encounter notes.  I, Malcolm Metro, RMA, am acting as transcriptionist for Reuben Likes, MD.  I have reviewed the above documentation for accuracy and completeness, and I agree with the above. - Reuben Likes, MD

## 2023-02-20 ENCOUNTER — Encounter: Payer: Self-pay | Admitting: Internal Medicine

## 2023-02-20 ENCOUNTER — Ambulatory Visit: Payer: Medicaid Other | Attending: Internal Medicine | Admitting: Internal Medicine

## 2023-02-20 VITALS — BP 106/66 | HR 69 | Ht 70.0 in | Wt 232.0 lb

## 2023-02-20 DIAGNOSIS — R42 Dizziness and giddiness: Secondary | ICD-10-CM | POA: Diagnosis not present

## 2023-02-20 NOTE — Progress Notes (Signed)
Cardiology Office Note  Date: 02/20/2023   ID: Airen Dales, DOB 1966/05/25, MRN [id number]  PCP:  Erasmo Downer, MD  Cardiologist:  Marjo Bicker, MD Electrophysiologist:  None   Reason for Office Visit: Evaluation of dizziness at the request of Dr Lawson Radar   History of Present Illness: Jane [name] is a 57 y.o. 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  Patient was at her endocrinology clinic on the other day when she felt a thump on her head followed by black dots in her vision field and blackening of her vision completely.  She was almost able to pass out but did not lose consciousness. Soon after this incident, her blood pressure was checked and it was noted to be 151 mmHg SBP.  I checked orthostatic vitals today in the clinic, they were noted to be between 90 to 100 mmHg.  No evidence of orthostatic hypotension or POTS.  She does not have any echocardiogram on file.  No prior ischemia evaluation. No prior MI/PCI/CABG.  Denies any angina, DOE.  Feels extremely tired all the time. Her TSH levels were within normal limits, she gets frequent iron infusions.  She does not know her vitamin D levels here.  She does use CPAP every night but by the time she wakes up in the morning, her mouth gets dry.  She does not follow-up with any sleep medicine doctor and unclear who manages her CPAP.  Past Medical History:  Diagnosis Date   ADD (attention deficit disorder)    Anxiety    Back pain    Bilateral swelling of feet    Bipolar 1 disorder    Bipolar 1 disorder    Bipolar disorder    Chewing difficulty    Chronic fatigue syndrome    Chronic kidney disease    Stage 3 kidney disease;dx by Dr. Kari Baars.    Constipation    Depression    Diabetes mellitus without complication    diet controlled   Dyspnea     with exertion   GERD (gastroesophageal reflux disease)    Headache    migraines   High cholesterol    History of blood clots    History of DVT (deep vein thrombosis)    left leg   Hypertension    states under control with meds., has been on med. x 2 years   Hypothyroidism    Joint pain    Neuropathy    Obsessive-compulsive disorder    Peripheral vascular disease    Prediabetes    Respiratory failure requiring intubation    Restless leg syndrome    Shortness of breath    Sleep apnea    Thrombocytopenia 09/15/2019   Trigger thumb of left hand 01/2018   Trigger thumb of right hand     Past Surgical History:  Procedure Laterality Date   ABDOMINAL HYSTERECTOMY  06/2016   complete   APPLICATION OF WOUND VAC Left 04/27/2019   Procedure: APPLICATION OF WOUND VAC LEFT GROIN;  Surgeon: Maeola Harman, MD;  Location: [facility] OR;  Service: Vascular;  Laterality: Left;   AV FISTULA PLACEMENT Left 11/24/2018   Procedure: ARTERIOVENOUS (AV) FISTULA CREATION LEFT SFA TO LEFT FEMORAL VEIN;  Surgeon: Maeola Harman, MD;  Location: [facility] OR;  Service: Vascular;  Laterality: Left;   BACK SURGERY  CHOLECYSTECTOMY     COLONOSCOPY WITH PROPOFOL N/A 11/22/2019   Procedure: COLONOSCOPY WITH PROPOFOL;  Surgeon: Wyline Mood, MD;  Location: [facility] ENDOSCOPY;  Service: Gastroenterology;  Laterality: N/A;   COLONOSCOPY WITH PROPOFOL N/A 12/09/2022   Procedure: COLONOSCOPY WITH PROPOFOL;  Surgeon: Wyline Mood, MD;  Location: [facility] ENDOSCOPY;  Service: Gastroenterology;  Laterality: N/A;   FEMORAL ARTERY EXPLORATION  03/30/2019   Procedure: Left Common Femoral Artery and Vein Exploration;  Surgeon: Maeola Harman, MD;  Location: [facility] OR;  Service: Vascular;;   FEMORAL-FEMORAL BYPASS GRAFT Left 11/24/2018   Procedure: BYPASS GRAFT FEMORAL-FEMORAL VENOUS LEFT TO RIGHT PALMA PROCEDURE USING CRYOVEIN;  Surgeon: Maeola Harman, MD;  Location: [facility] OR;  Service: Vascular;  Laterality:  Left;   GROIN DEBRIDEMENT Left 04/27/2019   Procedure: GROIN DEBRIDEMENT;  Surgeon: Maeola Harman, MD;  Location: [facility] OR;  Service: Vascular;  Laterality: Left;   INSERTION OF ILIAC STENT  03/30/2019   Procedure: Stent of left common, external iliac veins and left common femoral vein;  Surgeon: Maeola Harman, MD;  Location: [facility] OR;  Service: Vascular;;   KNEE ARTHROSCOPY WITH MENISCAL REPAIR Left 11/14/2020   Procedure: LEFT KNEE ARTHROSCOPY WITH PARTIAL MEDIAL MENISCECTOMY;  Surgeon: Vickki Hearing, MD;  Location: [facility] ORS;  Service: Orthopedics;  Laterality: Left;   LOWER EXTREMITY VENOGRAPHY N/A 08/17/2018   Procedure: LOWER EXTREMITY VENOGRAPHY - Central Venogram;  Surgeon: Maeola Harman, MD;  Location: [facility] INVASIVE CV LAB;  Service: Cardiovascular;  Laterality: N/A;   LOWER EXTREMITY VENOGRAPHY Bilateral 03/09/2019   Procedure: LOWER EXTREMITY VENOGRAPHY;  Surgeon: Maeola Harman, MD;  Location: [facility] INVASIVE CV LAB;  Service: Cardiovascular;  Laterality: Bilateral;   LOWER EXTREMITY VENOGRAPHY Left 08/16/2019   Procedure: LOWER EXTREMITY VENOGRAPHY;  Surgeon: Maeola Harman, MD;  Location: [facility] INVASIVE CV LAB;  Service: Cardiovascular;  Laterality: Left;   LOWER EXTREMITY VENOGRAPHY Left 03/25/2022   Procedure: LOWER EXTREMITY VENOGRAPHY;  Surgeon: Maeola Harman, MD;  Location: [facility] INVASIVE CV LAB;  Service: Cardiovascular;  Laterality: Left;  IVUS   LUMBAR FUSION  11/21/2000   L5-S1   LUMBAR SPINE SURGERY     x 2 others   PATCH ANGIOPLASTY Left 03/30/2019   Procedure: Patch Angioplasty of the Left Common Femoral Vein using Venosure Biologic patch;  Surgeon: Maeola Harman, MD;  Location: [facility] OR;  Service: Vascular;  Laterality: Left;   PERIPHERAL VASCULAR INTERVENTION Left 08/16/2019   Procedure: PERIPHERAL VASCULAR INTERVENTION;  Surgeon: Maeola Harman, MD;  Location: [facility] INVASIVE CV LAB;  Service:  Cardiovascular;  Laterality: Left;  common femoral/femoral vein stent   PERIPHERAL VASCULAR INTERVENTION Left 03/25/2022   Procedure: PERIPHERAL VASCULAR INTERVENTION;  Surgeon: Maeola Harman, MD;  Location: [facility] INVASIVE CV LAB;  Service: Cardiovascular;  Laterality: Left;  COMMON FEMORAL VEIN   PERIPHERAL VASCULAR THROMBECTOMY Left 03/25/2022   Procedure: PERIPHERAL VASCULAR THROMBECTOMY;  Surgeon: Maeola Harman, MD;  Location: [facility] INVASIVE CV LAB;  Service: Cardiovascular;  Laterality: Left;  EXTREMITY/IVC   TRIGGER FINGER RELEASE Right 12/01/2017   Procedure: RELEASE TRIGGER FINGER/A-1 PULLEY RIGHT THUMB;  Surgeon: Betha Loa, MD;  Location: [facility];  Service: Orthopedics;  Laterality: Right;   TRIGGER FINGER RELEASE Left 01/26/2018   Procedure: LEFT TRIGGER THUMB RELEASE;  Surgeon: Betha Loa, MD;  Location: [facility];  Service: Orthopedics;  Laterality: Left;   ULTRASOUND GUIDANCE FOR VASCULAR ACCESS Right 03/30/2019   Procedure: Ultrasound-guided cannulation right internal jugular vein;  Surgeon: Maeola Harman, MD;  Location: [facility] OR;  Service: Vascular;  Laterality: Right;    Current Outpatient Medications  Medication Sig Dispense Refill   ALPRAZolam (XANAX) 1 MG tablet Take 1 tablet (1 mg total) by mouth 2 (two) times daily as needed. for anxiety 60 tablet 2   amphetamine-dextroamphetamine (ADDERALL) 30 MG tablet Take 1 tablet by mouth 2 (two) times daily. 60 tablet 0   amphetamine-dextroamphetamine (ADDERALL) 30 MG tablet Take 1 tablet by mouth 2 (two) times daily. 60 tablet 0   amphetamine-dextroamphetamine (ADDERALL) 30 MG tablet Take 1 tablet by mouth 2 (two) times daily. 60 tablet 0   aspirin EC 81 MG tablet Take 81 mg by mouth daily.     aspirin-acetaminophen-caffeine (EXCEDRIN MIGRAINE) 250-250-65 MG tablet Take 2 tablets by mouth every 6 (six) hours as needed for headache.     atorvastatin (LIPITOR) 40 MG tablet  TAKE 1 TABLET BY MOUTH EVERY DAY 90 tablet 3   cetirizine (ZYRTEC) 10 MG tablet TAKE 1 TABLET BY MOUTH DAILY 90 tablet 0   colchicine 0.6 MG tablet TAKE 2 TABLETS, TAKE 1 TABLET BY MOUTH 1 hour AFTER, THEN TAKE 1 TABLET EVERY DAY FOR 7 DAYS 10 tablet 2   cyclobenzaprine (FLEXERIL) 5 MG tablet Take 1 tablet (5 mg total) by mouth 3 (three) times daily as needed. 15 tablet 0   famotidine (PEPCID) 20 MG tablet Take by mouth.     FLUoxetine (PROZAC) 40 MG capsule Take 1 capsule (40 mg total) by mouth 2 (two) times daily. 180 capsule 2   GARLIC PO Take 1 tablet by mouth daily.     HYDROcodone-acetaminophen (NORCO) 10-325 MG tablet Take 1 tablet by mouth every 6 (six) hours as needed. To last 30 days from fill date 120 tablet 0   [START ON 02/24/2023] HYDROcodone-acetaminophen (NORCO) 10-325 MG tablet Take 1 tablet by mouth every 6 (six) hours as needed. To last 30 days from fill date 120 tablet 0   hydrOXYzine (ATARAX) 25 MG tablet Take by mouth.     ketoconazole (NIZORAL) 2 % shampoo Apply topically daily.     levothyroxine (SYNTHROID) 125 MCG tablet Take 125 mcg by mouth daily before breakfast.     LINZESS 145 MCG CAPS capsule TAKE ONE CAPSULE BY MOUTH DAILY 90 capsule 3   lisinopril (ZESTRIL) 5 MG tablet TAKE 1 TABLET BY MOUTH DAILY 90 tablet 1   methocarbamol (ROBAXIN) 750 MG tablet Take 1 tablet (750 mg total) by mouth 2 (two) times daily as needed for muscle spasms. 60 tablet 5   Multiple Vitamin (MULTIVITAMIN) capsule Take 1 capsule by mouth daily.     omega-3 acid ethyl esters (LOVAZA) 1 g capsule Take by mouth.     Omega-3 Fatty Acids (OMEGA-3 FISH OIL PO) Take 1 capsule by mouth daily.     ondansetron (ZOFRAN-ODT) 4 MG disintegrating tablet Take 1 tablet (4 mg total) by mouth every 8 (eight) hours as needed for nausea or vomiting. 15 tablet 0   pantoprazole (PROTONIX) 40 MG tablet TAKE 1 TABLET BY MOUTH DAILY 90 tablet 0   polyethylene glycol powder (GLYCOLAX/MIRALAX) 17 GM/SCOOP powder Take  17 g by mouth daily. 850 g 1   potassium chloride (KLOR-CON M) 10 MEQ tablet TAKE 1 TABLET BY MOUTH THREE TIMES DAILY 270 tablet 1   pregabalin (LYRICA) 100 MG capsule TAKE ONE CAPSULE BY MOUTH FOUR TIMES DAILY 120 capsule 1   rOPINIRole (REQUIP) 2 MG tablet TAKE 1 TABLET BY MOUTH TWICE  DAILY 180 tablet 3   Semaglutide, 1 MG/DOSE, 4 MG/3ML SOPN Inject 1 mg as directed once a week. 9 mL 0   traZODone (DESYREL) 150 MG tablet Take 1 tablet (150 mg total) by mouth at bedtime. 30 tablet 2   triamcinolone cream (KENALOG) 0.1 % Apply to rash on lower legs once to twice daily as needed. Avoid face, groin, axilla. 80 g 2   valACYclovir (VALTREX) 1000 MG tablet Take 2 tablets by mouth at first onset of fever blisters, then take 2 tablets by mouth 12 hours later. 30 tablet 3   Vibegron (GEMTESA) 75 MG TABS Take 75 mg by mouth daily. 90 tablet 3   Vibegron (GEMTESA) 75 MG TABS Take 1 tablet (75 mg total) by mouth daily. 90 tablet 3   warfarin (COUMADIN) 5 MG tablet Take 1 tablet (5 mg total) by mouth daily. (Patient taking differently: Take 5 mg by mouth daily. PCP manages.) 60 tablet 3   No current facility-administered medications for this visit.   Allergies:  Aripiprazole, Seroquel [quetiapine fumarate], Chlorpromazine, Gabapentin, Prednisone, and Quetiapine   Social History: The patient  reports that she has never smoked. She has never used smokeless tobacco. She reports that she does not drink alcohol and does not use drugs.   Family History: The patient's family history includes ADD / ADHD in her daughter; Anxiety disorder in her daughter; Bipolar disorder in her daughter, maternal aunt, and mother; Depression in her mother; Diabetes in her father; Drug abuse in her daughter and daughter; Heart disease in her brother, father, and mother; Hyperlipidemia in her brother, father, and mother; Hypertension in her brother, father, mother, and sister; Obesity in her father and mother; Sleep apnea in her father  and mother; Stroke in her mother; Suicidality in her maternal aunt.   ROS:  Please see the history of present illness. Otherwise, complete review of systems is positive for none  All other systems are reviewed and negative.   Physical Exam: VS:  BP 106/66 (BP Location: Right Arm, Cuff Size: Large)   Pulse 69   Ht 5\' 10"  (1.778 m)   Wt 232 lb (105.2 kg)   LMP  (LMP Unknown)   SpO2 93%   BMI 33.29 kg/m , BMI Body mass index is 33.29 kg/m.  Wt Readings from Last 3 Encounters:  02/20/23 232 lb (105.2 kg)  02/17/23 223 lb (101.2 kg)  01/30/23 229 lb (103.9 kg)    General: Patient appears comfortable at rest. HEENT: Conjunctiva and lids normal, oropharynx clear with moist mucosa. Neck: Supple, no elevated JVP or carotid bruits, no thyromegaly. Lungs: Clear to auscultation, nonlabored breathing at rest. Cardiac: Regular rate and rhythm, no S3 or significant systolic murmur, no pericardial rub. Abdomen: Soft, nontender, no hepatomegaly, bowel sounds present, no guarding or rebound. Extremities: No pitting edema, distal pulses 2+. Skin: Warm and dry. Musculoskeletal: No kyphosis. Neuropsychiatric: Alert and oriented x3, affect grossly appropriate.  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  # Presyncope:  Patient had 1 episode of presyncope at endocrinology clinic 19 this could be secondary to low blood pressures. Orthostatic vitals obtained today showed no evidence of orthostatic hypotension or POTS. But her baseline blood pressures are between 90 to 100 mmHg SBP. I will go ahead and stop HCTZ and continue lisinopril.  EKG showed NSR, frequent PACs.  I will also obtain 1 week event monitor to rule out any conduction system abnormality.  Obtain 2D echocardiogram. # HTN: Discontinue HCTZ and continue lisinopril. # OSA on CPAP.  Follow-up with a physician or provider who manages her CPAP. # Chronic fatigue: Receives iron infusions frequently. TSH within normal limits.  She did not get her vitamin D levels checked yet.  Untreated OSA can also cause fatigue which I instructed her to follow-up with sleep medicine. 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  Failed DOAC therapy, currently on Coumadin.  I have spent a total of 45 minutes with patient reviewing chart, EKGs, labs and examining patient as well as establishing an assessment and plan that was discussed with the patient.  > 50% of time was spent in direct patient care.    Medication Adjustments/Labs and Tests Ordered: Current medicines are reviewed at length with the patient today.  Concerns regarding medicines are outlined above.   Tests Ordered: Orders Placed This Encounter  Procedures   ECHOCARDIOGRAM COMPLETE    Medication Changes: No orders of the defined types were placed in this encounter.   Disposition:  Follow up  pending results  Signed Lelend Heinecke Verne Spurr, MD, 02/20/2023 4:57 PM    [facility] at [facility] [address] [place], [place], [place] [address]

## 2023-02-20 NOTE — Patient Instructions (Addendum)
Medication Instructions:  Your physician has recommended you make the following change in your medication:  Stop hydrochlorothiazide Continue other medications the same  Labwork: none  Testing/Procedures: Your physician has requested that you have an echocardiogram. Echocardiography is a painless test that uses sound waves to create images of your heart. It provides your doctor with information about the size and shape of your heart and how well your heart's chambers and valves are working. This procedure takes approximately one hour. There are no restrictions for this procedure. Please do NOT wear cologne, perfume, aftershave, or lotions (deodorant is allowed). Please arrive 15 minutes prior to your appointment time.  Follow-Up: Your physician recommends that you schedule a follow-up appointment in: pending  Any Other Special Instructions Will Be Listed Below (If Applicable).  If you need a refill on your cardiac medications before your next appointment, please call your pharmacy.

## 2023-02-21 ENCOUNTER — Other Ambulatory Visit: Payer: Self-pay

## 2023-02-21 ENCOUNTER — Other Ambulatory Visit: Payer: Self-pay | Admitting: Internal Medicine

## 2023-02-21 ENCOUNTER — Ambulatory Visit: Payer: Medicaid Other | Attending: Internal Medicine

## 2023-02-21 ENCOUNTER — Telehealth: Payer: Self-pay | Admitting: Internal Medicine

## 2023-02-21 DIAGNOSIS — R55 Syncope and collapse: Secondary | ICD-10-CM

## 2023-02-21 DIAGNOSIS — D509 Iron deficiency anemia, unspecified: Secondary | ICD-10-CM

## 2023-02-21 NOTE — Telephone Encounter (Signed)
  LONG TERM MONITOR

## 2023-02-24 ENCOUNTER — Inpatient Hospital Stay: Payer: Medicaid Other | Attending: Physician Assistant

## 2023-02-24 DIAGNOSIS — D631 Anemia in chronic kidney disease: Secondary | ICD-10-CM | POA: Insufficient documentation

## 2023-02-24 DIAGNOSIS — K909 Intestinal malabsorption, unspecified: Secondary | ICD-10-CM | POA: Insufficient documentation

## 2023-02-24 DIAGNOSIS — D509 Iron deficiency anemia, unspecified: Secondary | ICD-10-CM

## 2023-02-24 DIAGNOSIS — K648 Other hemorrhoids: Secondary | ICD-10-CM | POA: Diagnosis not present

## 2023-02-24 DIAGNOSIS — Z86718 Personal history of other venous thrombosis and embolism: Secondary | ICD-10-CM | POA: Insufficient documentation

## 2023-02-24 DIAGNOSIS — Z7901 Long term (current) use of anticoagulants: Secondary | ICD-10-CM | POA: Diagnosis not present

## 2023-02-24 DIAGNOSIS — D696 Thrombocytopenia, unspecified: Secondary | ICD-10-CM | POA: Insufficient documentation

## 2023-02-24 DIAGNOSIS — D508 Other iron deficiency anemias: Secondary | ICD-10-CM | POA: Diagnosis present

## 2023-02-24 DIAGNOSIS — N189 Chronic kidney disease, unspecified: Secondary | ICD-10-CM | POA: Insufficient documentation

## 2023-02-24 DIAGNOSIS — Z862 Personal history of diseases of the blood and blood-forming organs and certain disorders involving the immune mechanism: Secondary | ICD-10-CM

## 2023-02-24 LAB — CBC WITH DIFFERENTIAL/PLATELET
Abs Immature Granulocytes: 0.03 10*3/uL (ref 0.00–0.07)
Basophils Absolute: 0 10*3/uL (ref 0.0–0.1)
Basophils Relative: 1 %
Eosinophils Absolute: 0.1 10*3/uL (ref 0.0–0.5)
Eosinophils Relative: 2 %
HCT: 40.8 % (ref 36.0–46.0)
Hemoglobin: 13.5 g/dL (ref 12.0–15.0)
Immature Granulocytes: 1 %
Lymphocytes Relative: 23 %
Lymphs Abs: 1.1 10*3/uL (ref 0.7–4.0)
MCH: 31.4 pg (ref 26.0–34.0)
MCHC: 33.1 g/dL (ref 30.0–36.0)
MCV: 94.9 fL (ref 80.0–100.0)
Monocytes Absolute: 0.5 10*3/uL (ref 0.1–1.0)
Monocytes Relative: 9 %
Neutro Abs: 3.1 10*3/uL (ref 1.7–7.7)
Neutrophils Relative %: 64 %
Platelets: 152 10*3/uL (ref 150–400)
RBC: 4.3 MIL/uL (ref 3.87–5.11)
RDW: 13.1 % (ref 11.5–15.5)
WBC: 4.8 10*3/uL (ref 4.0–10.5)
nRBC: 0 % (ref 0.0–0.2)

## 2023-02-24 LAB — FERRITIN: Ferritin: 214 ng/mL (ref 11–307)

## 2023-02-24 LAB — HEPATITIS B SURFACE ANTIGEN: Hepatitis B Surface Ag: NONREACTIVE

## 2023-02-24 LAB — IRON AND TIBC
Iron: 93 ug/dL (ref 28–170)
Saturation Ratios: 29 % (ref 10.4–31.8)
TIBC: 322 ug/dL (ref 250–450)
UIBC: 229 ug/dL

## 2023-02-24 LAB — HEPATITIS B SURFACE ANTIBODY,QUALITATIVE: Hep B S Ab: NONREACTIVE

## 2023-02-24 LAB — FOLATE: Folate: 19 ng/mL (ref >5.9)

## 2023-02-24 LAB — IMMATURE PLATELET FRACTION: Immature Platelet Fraction: 3 % (ref 1.2–8.6)

## 2023-02-24 LAB — HEPATITIS B CORE ANTIBODY, TOTAL: Hep B Core Total Ab: NONREACTIVE

## 2023-02-24 LAB — VITAMIN B12: Vitamin B-12: 441 pg/mL (ref 180–914)

## 2023-02-25 DIAGNOSIS — R55 Syncope and collapse: Secondary | ICD-10-CM

## 2023-02-25 LAB — RHEUMATOID FACTOR: Rheumatoid fact SerPl-aCnc: <10 IU/mL (ref <14.0)

## 2023-02-25 LAB — ANA: Anti Nuclear Antibody (ANA): NEGATIVE

## 2023-02-26 ENCOUNTER — Encounter: Payer: Self-pay | Admitting: Physician Assistant

## 2023-02-26 LAB — COPPER, SERUM: Copper: 118 ug/dL (ref 80–158)

## 2023-02-27 LAB — PROTEIN ELECTROPHORESIS, SERUM
A/G Ratio: 1.1 (ref 0.7–1.7)
Albumin ELP: 3.9 g/dL (ref 2.9–4.4)
Alpha-1-Globulin: 0.2 g/dL (ref 0.0–0.4)
Alpha-2-Globulin: 1 g/dL (ref 0.4–1.0)
Beta Globulin: 1.1 g/dL (ref 0.7–1.3)
Gamma Globulin: 1.1 g/dL (ref 0.4–1.8)
Globulin, Total: 3.4 g/dL (ref 2.2–3.9)
Total Protein ELP: 7.3 g/dL (ref 6.0–8.5)

## 2023-02-28 ENCOUNTER — Ambulatory Visit: Payer: Medicaid Other | Admitting: Urology

## 2023-02-28 LAB — IMMUNOFIXATION ELECTROPHORESIS
IgA: 284 mg/dL (ref 87–352)
IgG (Immunoglobin G), Serum: 1035 mg/dL (ref 586–1602)
IgM (Immunoglobulin M), Srm: 79 mg/dL (ref 26–217)
Total Protein ELP: 7 g/dL (ref 6.0–8.5)

## 2023-02-28 NOTE — Progress Notes (Unsigned)
VIRTUAL VISIT via TELEPHONE NOTE [facility]   I connected with Jane [name]  on 03/03/23 at 1:08 PM by telephone and verified that I am speaking with the correct person using two identifiers.  Location: Patient: Home Provider: [facility]   I discussed the limitations, risks, security and privacy concerns of performing an evaluation and management service by telephone and the availability of in person appointments. I also discussed with the patient that there may be a patient responsible charge related to this service. The patient expressed understanding and agreed to proceed.  REASON FOR VISIT:  Follow-up for iron deficiency anemia   CURRENT THERAPY: Intermittent iron infusions (most recent Feraheme on 10/31/2022)  INTERVAL HISTORY:  Jane [name] is contacted today for follow-up of her iron deficiency anemia.  She was last evaluated via telemedicine visit by Rojelio Brenner PA-C on 08/30/2022.   At today's visit, she reports feeling fair.  She denies any major changes in her health since her last visit.   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  She reports that she had a blood clot in one of her lower extremity stents in June 2023. She continues to take Coumadin as prescribed and has her INR checked regularly by her PCP.    She has ongoing fatigue and hypersomnolence, reports that she has been diagnosed with OSA and uses sleep apnea but "does not think it is working right."  She felt some short-lived improvement in fatigue after IV iron in December 2023.  She has not noticed any recent bleeding such as bright red blood per rectum, melena, or epistaxis.  She reports chronic intermittent hematuria, following with urology.  She reports some occasional chest pain, palpitations, and recent syncopal episode and is being followed by cardiology with workup including heart monitor.  She denies any pica, dyspnea  on exertion, or lightheadedness.   She has little to no energy and 100% appetite. She has been using diet and exercise to lose weight, and has lost about 65 pounds in the past year.  REVIEW OF SYSTEMS:   Review of Systems  Constitutional:  Positive for malaise/fatigue. Negative for chills, diaphoresis, fever and weight loss.  Respiratory:  Positive for cough. Negative for shortness of breath.   Cardiovascular:  Positive for chest pain and palpitations.  Gastrointestinal:  Positive for constipation. Negative for abdominal pain, blood in stool, melena, nausea and vomiting.  Genitourinary:  Positive for dysuria (currently on antibiotics for UTI).  Musculoskeletal:  Positive for back pain and joint pain.  Neurological:  Positive for dizziness, tingling and headaches.  Endo/Heme/Allergies:  Positive for environmental allergies.  Psychiatric/Behavioral:  Positive for depression. The patient is nervous/anxious.      PHYSICAL EXAM: (per limitations of virtual telephone visit)  The patient is alert and oriented x 3, exhibiting adequate mentation, good mood, and ability to speak in full sentences and execute sound judgement.  ASSESSMENT & PLAN:  1.  Normocytic anemia: - Combination anemia from CKD and malabsorption-related iron deficiency. - Colonoscopy on 11/22/2019 shows 3 small polyps in the ascending colon and the cecum.  Nonbleeding internal hemorrhoids.  Pathology consistent with tubular adenoma. - Failed oral iron supplementation: She has taken iron pills in the past without improvement. - Last IV iron infusion (Feraheme x1) on 10/31/2022 - She had a history of blood transfusions in the past when she had her hysterectomy, and also required transfusion after large intraoperative blood loss (3.6  L) during vascular surgery in May 2020 - Denies any current signs or symptoms of blood loss, no melena or bright red blood per rectum.   Scant hematuria has been worked up by urology. - She continues to  have chronic fatigue - Most recent labs (02/24/2023): Hgb 13.5/MCV 94.9, ferritin 214, iron saturation 29% - PLAN: No indication for IV iron at this time.   - RTC in 1 year  2.  Thrombocytopenia, mild - Mild intermittent thrombocytopenia since May 2020, with platelets ranging from 117 to normal - Negative HIV test (June 2020).  Negative hepatitis C test (January 2023).  Hepatitis B negative (April 2024). - CT venogram of abdomen/pelvis (06/10/2022) showed normal spleen and liver without any focal abnormality - Most recent labs (02/24/2023): Normal B12, copper, folate.  MMA normal  Normal SPEP and immunofixation Immature platelet fraction normal at 3.0% Negative RF and ANA. - Most recent CBC/differential (02/24/2023) with normal platelets 152 - She denies any abnormal bleeding or bruising.   - PLAN: We will continue to monitor and consider additional workup if any major deviations from baseline or development of any other cytopenias    3.  Recurrent left leg DVTs: - She had first episode of left leg DVT in 2014.  She had a recurrent episode was while she was taking Xarelto.  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 - Coagulopathy work-up (November 2020) negative for lupus anticoagulant, anticardiolipin antibodies, or beta-2 glycoprotein antibodies. - She follows with Dr. Randie Heinz at [facility] in [place] - She is currently on Coumadin, also follows with the Coumadin clinic for INR checks and dose adjustments. - Most recent DVT diagnosed during ED visit on 04/12/2021: Venous ultrasound shows obstruction at the level of the left common femoral vein and saphenofemoral junction scarring; appearance of thrombus in the common femoral vein suggests that there is chronic thrombus in the area although superimposed acute DVT in the left cannot be excluded - PLAN: Continue warfarin and follow-up with vascular specialists  PLAN  SUMMARY: >> Labs in 1 year = CBC/D, ferritin, iron/TIBC, CMP, LDH >> OFFICE visit in 1 year      I discussed the assessment and treatment plan with the patient. The patient was provided an opportunity to ask questions and all were answered. The patient agreed with the plan and demonstrated an understanding of the instructions.   The patient was advised to call back or seek an in-person evaluation if the symptoms worsen or if the condition fails to improve as anticipated.  I provided 22 minutes of non-face-to-face time during this encounter.  Carnella Guadalajara, PA-C 03/03/23 1:31 PM

## 2023-03-03 ENCOUNTER — Other Ambulatory Visit: Payer: Self-pay | Admitting: Family Medicine

## 2023-03-03 ENCOUNTER — Ambulatory Visit: Payer: Medicaid Other | Admitting: Physician Assistant

## 2023-03-03 ENCOUNTER — Other Ambulatory Visit: Payer: Self-pay

## 2023-03-03 ENCOUNTER — Inpatient Hospital Stay (HOSPITAL_BASED_OUTPATIENT_CLINIC_OR_DEPARTMENT_OTHER): Payer: Medicaid Other | Admitting: Physician Assistant

## 2023-03-03 DIAGNOSIS — N189 Chronic kidney disease, unspecified: Secondary | ICD-10-CM

## 2023-03-03 DIAGNOSIS — D631 Anemia in chronic kidney disease: Secondary | ICD-10-CM

## 2023-03-03 DIAGNOSIS — D509 Iron deficiency anemia, unspecified: Secondary | ICD-10-CM

## 2023-03-03 DIAGNOSIS — I82409 Acute embolism and thrombosis of unspecified deep veins of unspecified lower extremity: Secondary | ICD-10-CM

## 2023-03-03 DIAGNOSIS — D696 Thrombocytopenia, unspecified: Secondary | ICD-10-CM | POA: Diagnosis not present

## 2023-03-03 DIAGNOSIS — I152 Hypertension secondary to endocrine disorders: Secondary | ICD-10-CM

## 2023-03-03 DIAGNOSIS — D649 Anemia, unspecified: Secondary | ICD-10-CM

## 2023-03-03 DIAGNOSIS — Z86718 Personal history of other venous thrombosis and embolism: Secondary | ICD-10-CM

## 2023-03-03 LAB — METHYLMALONIC ACID, SERUM: Methylmalonic Acid, Quantitative: 258 nmol/L (ref 0–378)

## 2023-03-04 NOTE — Telephone Encounter (Signed)
Requested medication (s) are due for refill today: yes  Requested medication (s) are on the active medication list: yes  Last refill:  06/24/22 #270 1 refills  Future visit scheduled: yes in 2 months   Notes to clinic:  last ordered by A. Rumball, DO 06/24/22 last labs 01/30/23. Do you want to refill Rx?     Requested Prescriptions  Pending Prescriptions Disp Refills   potassium chloride (KLOR-CON M) 10 MEQ tablet [Pharmacy Med Name: potassium chloride ER 10 mEq tablet,extended release(part/cryst)] 270 tablet 1    Sig: TAKE 1 TABLET BY MOUTH THREE TIMES DAILY     Endocrinology:  Minerals - Potassium Supplementation Failed - 03/03/2023  8:00 AM      Failed - Cr in normal range and within 360 days    Creatinine, Ser  Date Value Ref Range Status  01/30/2023 1.38 (H) 0.44 - 1.00 mg/dL Final         Passed - K in normal range and within 360 days    Potassium  Date Value Ref Range Status  01/30/2023 3.5 3.5 - 5.1 mmol/L Final         Passed - Valid encounter within last 12 months    Recent Outpatient Visits           3 months ago Encounter for annual health examination   [facility] [place], Jane Schlein, MD   4 months ago Upper respiratory tract infection, unspecified type   [facility] Alfredia Ferguson, PA-C   6 months ago Tinnitus of right ear   [facility] [place], Jane Schlein, MD   7 months ago Other fatigue   [facility] Caro Laroche, DO   8 months ago Ear pain, left   [facility] [place], [place], [place]       Future Appointments             In 2 weeks McGowan, Elana [facility] [place]   In 2 months Bacigalupo, Jane Schlein, MD [facility], [facility] [place]

## 2023-03-06 ENCOUNTER — Other Ambulatory Visit: Payer: Self-pay

## 2023-03-06 ENCOUNTER — Other Ambulatory Visit: Payer: Self-pay | Admitting: Family Medicine

## 2023-03-06 ENCOUNTER — Other Ambulatory Visit (HOSPITAL_COMMUNITY): Payer: Self-pay

## 2023-03-06 DIAGNOSIS — M792 Neuralgia and neuritis, unspecified: Secondary | ICD-10-CM

## 2023-03-06 DIAGNOSIS — E1142 Type 2 diabetes mellitus with diabetic polyneuropathy: Secondary | ICD-10-CM

## 2023-03-06 NOTE — Telephone Encounter (Signed)
Requested medication (s) are due for refill today: yes  Requested medication (s) are on the active medication list: yes  Last refill:  11/08/22  Future visit scheduled: yes  Notes to clinic:  Unable to refill per protocol, cannot delegate.      Requested Prescriptions  Pending Prescriptions Disp Refills   pregabalin (LYRICA) 100 MG capsule [Pharmacy Med Name: pregabalin 100 mg capsule] 120 capsule 1    Sig: TAKE ONE CAPSULE BY MOUTH FOUR TIMES DAILY     Not Delegated - Neurology:  Anticonvulsants - Controlled - pregabalin Failed - 03/06/2023  8:01 AM      Failed - This refill cannot be delegated      Failed - Cr in normal range and within 360 days    Creatinine, Ser  Date Value Ref Range Status  01/30/2023 1.38 (H) 0.44 - 1.00 mg/dL Final         Passed - Completed PHQ-2 or PHQ-9 in the last 360 days      Passed - Valid encounter within last 12 months    Recent Outpatient Visits           3 months ago Encounter for annual health examination   [facility] [place], Marzella Schlein, MD   4 months ago Upper respiratory tract infection, unspecified type   [facility] Alfredia Ferguson, PA-C   6 months ago Tinnitus of right ear   [facility] [place], Marzella Schlein, MD   7 months ago Other fatigue   [facility] Caro Laroche, DO   8 months ago Ear pain, left   [facility] [place], [place], PA-C       Future Appointments             In 2 weeks McGowan, Elana Al[facility] [place]   In 2 months Bacigalupo, Marzella Schlein, MD [facility], [facility]

## 2023-03-11 ENCOUNTER — Ambulatory Visit
Payer: Medicaid Other | Attending: Student in an Organized Health Care Education/Training Program | Admitting: Student in an Organized Health Care Education/Training Program

## 2023-03-11 ENCOUNTER — Encounter: Payer: Self-pay | Admitting: Student in an Organized Health Care Education/Training Program

## 2023-03-11 VITALS — BP 118/74 | HR 63 | Temp 97.4°F | Resp 16 | Ht 70.0 in | Wt 223.0 lb

## 2023-03-11 DIAGNOSIS — M5136 Other intervertebral disc degeneration, lumbar region: Secondary | ICD-10-CM | POA: Insufficient documentation

## 2023-03-11 DIAGNOSIS — G894 Chronic pain syndrome: Secondary | ICD-10-CM | POA: Diagnosis present

## 2023-03-11 DIAGNOSIS — M961 Postlaminectomy syndrome, not elsewhere classified: Secondary | ICD-10-CM | POA: Diagnosis not present

## 2023-03-11 DIAGNOSIS — I871 Compression of vein: Secondary | ICD-10-CM | POA: Diagnosis present

## 2023-03-11 DIAGNOSIS — Z0289 Encounter for other administrative examinations: Secondary | ICD-10-CM

## 2023-03-11 DIAGNOSIS — M546 Pain in thoracic spine: Secondary | ICD-10-CM | POA: Insufficient documentation

## 2023-03-11 DIAGNOSIS — M792 Neuralgia and neuritis, unspecified: Secondary | ICD-10-CM | POA: Diagnosis not present

## 2023-03-11 DIAGNOSIS — G8929 Other chronic pain: Secondary | ICD-10-CM

## 2023-03-11 DIAGNOSIS — E1142 Type 2 diabetes mellitus with diabetic polyneuropathy: Secondary | ICD-10-CM | POA: Diagnosis not present

## 2023-03-11 DIAGNOSIS — M7918 Myalgia, other site: Secondary | ICD-10-CM | POA: Diagnosis present

## 2023-03-11 DIAGNOSIS — M1712 Unilateral primary osteoarthritis, left knee: Secondary | ICD-10-CM | POA: Diagnosis present

## 2023-03-11 DIAGNOSIS — Z7985 Long-term (current) use of injectable non-insulin antidiabetic drugs: Secondary | ICD-10-CM

## 2023-03-11 DIAGNOSIS — M51369 Other intervertebral disc degeneration, lumbar region without mention of lumbar back pain or lower extremity pain: Secondary | ICD-10-CM

## 2023-03-11 MED ORDER — HYDROCODONE-ACETAMINOPHEN 10-325 MG PO TABS: 1.0000 | ORAL_TABLET | Freq: Four times a day (QID) | ORAL | 0 refills | Status: DC | PRN

## 2023-03-11 MED ORDER — METHOCARBAMOL 750 MG PO TABS
750.0000 mg | ORAL_TABLET | Freq: Two times a day (BID) | ORAL | 5 refills | Status: DC | PRN
Start: 2023-03-11 — End: 2024-03-16

## 2023-03-11 MED ORDER — HYDROCODONE-ACETAMINOPHEN 10-325 MG PO TABS: 1.0000 | ORAL_TABLET | Freq: Four times a day (QID) | ORAL | 0 refills | Status: AC | PRN

## 2023-03-11 NOTE — Progress Notes (Signed)
Nursing Pain Medication Assessment:  Safety precautions to be maintained throughout the outpatient stay will include: orient to surroundings, keep bed in low position, maintain call bell within reach at all times, provide assistance with transfer out of bed and ambulation.  Medication Inspection Compliance: Pill count conducted under aseptic conditions, in front of the patient. Neither the pills nor the bottle was removed from the patient's sight at any time. Once count was completed pills were immediately returned to the patient in their original bottle.  Medication: Hydrocodone/APAP Pill/Patch Count:  54 of 120 pills remain Pill/Patch Appearance: Markings consistent with prescribed medication Bottle Appearance: Standard pharmacy container. Clearly labeled. Filled Date: 04 / 22 / 2024 Last Medication intake:  Today

## 2023-03-11 NOTE — Addendum Note (Signed)
Addended by: Edward Jolly on: 03/11/2023 09:06 AM   Modules accepted: Orders

## 2023-03-11 NOTE — Progress Notes (Addendum)
PROVIDER NOTE: Information contained herein reflects review and annotations entered in association with encounter. Interpretation of such information and data should be left to medically-trained personnel. Information provided to patient can be located elsewhere in the medical record under "Patient Instructions". Document created using STT-dictation technology, any transcriptional errors that may result from process are unintentional.    Patient: Jane [name]  Service Category: E/M  Provider: Edward Jolly, MD  DOB: 02-Feb-1966  DOS: 03/11/2023  Specialty: Interventional Pain Management  MRN: [id number]  Setting: Ambulatory outpatient  PCP: Jane Downer, MD  Type: Established Patient    Referring Provider: Erasmo Downer, MD  Location: Office  Delivery: Face-to-face     HPI  [name], a 57 y.o. year old female, is here today because of her Diabetic peripheral neuropathy (HCC) [E11.42]. [name] primary complain today is Back Pain (Thoracic and lumbar bilateral ) and Knee Pain (Left )  Last encounter: My last encounter with her was on 12/19/22  Pertinent problems: 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 Pain  Assessment: Severity of Chronic pain is reported as a 7 /10. Location: Back Mid, Lower, Right, Left/down the left leg to the foot. Onset: More than a month ago. Quality: Discomfort, Constant, Throbbing. Timing: Constant. Modifying factor(s): rest, medications, nothing is really working. Vitals:  height is 5\' 10"  (1.778 m) and weight is 223 lb (101.2 kg). Her temporal temperature is 97.4 F (36.3 C) (abnormal). Her blood pressure is 118/74 and her pulse is 63. Her respiration is 16 and oxygen saturation is 98%.   Reason for encounter: medication management.    Seeing cardiology for a syncopal episode and has also been experiencing fatigue. Had a holter monitor study done.   She would like to trial Qutenza for PDN.  Otherwise refill Hydrocodone and Robaxin as below.    No change in dose.  UDS up-to-date and appropriate.  06/20/22 [name] presents today for medication management.  She is status post reek cannulization of left common external iliac artery stents and common femoral vein stent with restenting.  She is on Coumadin after failing direct oral anticoagulant therapy.  She has had worsening swelling of both of her legs.  She continues to have low back pain.  Of note, CT venogram shows occlusion of her stents.  Dr. Pascal Lux has discussed repeat thrombectomy with her but she is high risk.  She is currently being managed on Coumadin and surgery is being postponed.  I will refill her hydrocodone as below, no change in dose.  We will repeat urine toxicology screen.    Pharmacotherapy Assessment    Analgesic: Hydrocodone 10 mg 4 times daily as needed, quantity 120/month; MME equals 40   Monitoring: [place] PMP: PDMP not reviewed this encounter.       Pharmacotherapy: No side-effects or adverse reactions reported. Compliance: No problems identified. Effectiveness: Clinically acceptable.  UDS:  Summary  Date Value Ref Range Status  06/20/2022 Note  Final    Comment:     ==================================================================== [employer] Select 13 (MW) ==================================================================== Test                             Result       Flag       Units  Drug Present and Declared for Prescription Verification   Hydrocodone                    2725         EXPECTED   ng/mg creat   Dihydrocodeine                 371          EXPECTED   ng/mg creat   Norhydrocodone                 1296         EXPECTED   ng/mg creat    Sources of hydrocodone include scheduled prescription medications.    Dihydrocodeine and norhydrocodone are expected metabolites of    hydrocodone. Dihydrocodeine is also available as a scheduled    prescription medication.  Drug Present not Declared for Prescription Verification   Amphetamine                    10168        UNEXPECTED ng/mg creat    Amphetamine is available as a schedule II prescription drug.  Drug Absent but Declared for Prescription Verification   Alprazolam                     Not Detected UNEXPECTED ng/mg creat ==================================================================== Test                      Result    Flag   Units      Ref Range   Creatinine              85               mg/dL      >=16 ==================================================================== Declared Medications:  The flagging and interpretation on this report are based on the  following declared medications.  Unexpected results may arise from  inaccuracies in the declared medications.   **Note: The testing scope of this panel includes these medications:   Alprazolam (Xanax)  Hydrocodone (Norco)   **Note: The testing scope of this panel does not include the  following reported medications:   Acetaminophen (Excedrin)  Acetaminophen (Norco)  Amitriptyline (Elavil)  Aspirin  Aspirin (Excedrin)  Atorvastatin (Lipitor)  Caffeine (Excedrin)  Cetirizine (Zyrtec)  Colchicine  Fluoxetine (Prozac)   Hydrochlorothiazide (Hydrodiuril)  Levothyroxine (Synthroid)  Linaclotide (Linzess)  Lisinopril (Zestril)  Methocarbamol (Robaxin)  Multivitamin  Omega-3 Fatty Acids  Pantoprazole (Protonix)  Potassium (Klor-Con)  Pregabalin (Lyrica)  Ropinirole (Requip)  Semaglutide  Supplement  Vibegron (Gemtesa)  Warfarin (Coumadin) ==================================================================== For clinical consultation, please call [phone]. ====================================================================       ROS  Constitutional: Denies any fever or chills Gastrointestinal: No reported hemesis, hematochezia, vomiting, or acute GI distress Musculoskeletal: low back pain Neurological:  parasthesias bilateral feet  Medication Review  ALPRAZolam, FLUoxetine, Garlic, HYDROcodone-acetaminophen, Semaglutide (1 MG/DOSE), Vibegron, amphetamine-dextroamphetamine, aspirin EC, atorvastatin, cetirizine, colchicine, famotidine, ketoconazole, levothyroxine, linaclotide, methocarbamol, multivitamin, omega-3 acid ethyl esters, pantoprazole, polyethylene glycol powder, potassium chloride, pregabalin, rOPINIRole, traZODone, valACYclovir, and warfarin  History Review  Allergy: [name] is allergic to aripiprazole, seroquel [quetiapine fumarate], chlorpromazine, gabapentin, prednisone, and quetiapine. Drug: [name]  reports no history of drug use. Alcohol:  reports no history of alcohol use. Tobacco:  reports that she has never smoked. She has never used smokeless tobacco. Social: [name]  reports that she has never smoked. She has never used smokeless tobacco. She reports that she does not drink alcohol and does not use drugs. Medical:  has a past medical history of ADD (attention deficit disorder), Anxiety, Back pain, Bilateral swelling of feet, Bipolar 1 disorder (HCC), Bipolar 1 disorder (HCC), Bipolar disorder (HCC), Chewing difficulty, Chronic fatigue syndrome, Chronic  kidney disease, Constipation, Depression, Diabetes mellitus without complication (HCC), Dyspnea, GERD (gastroesophageal reflux disease), Headache, High cholesterol, History of blood clots, History of DVT (deep vein thrombosis), Hypertension, Hypothyroidism, Joint pain, Neuropathy, Obsessive-compulsive disorder, Peripheral vascular disease (HCC), Prediabetes, Respiratory failure requiring intubation (HCC), Restless leg syndrome, Shortness of breath, Sleep apnea, Thrombocytopenia (HCC) (09/15/2019), Trigger thumb of left hand (01/2018), and Trigger thumb of right hand. Surgical: [name]  has a past surgical history that includes Cholecystectomy; Trigger finger release (Right, 12/01/2017); Abdominal hysterectomy (06/2016); Lumbar fusion (11/21/2000); Lumbar spine surgery; Trigger finger release (Left, 01/26/2018); LOWER EXTREMITY VENOGRAPHY (N/A, 08/17/2018); Femoral-femoral Bypass Graft (Left, 11/24/2018); AV fistula placement (Left, 11/24/2018); LOWER EXTREMITY VENOGRAPHY (Bilateral, 03/09/2019); Patch angioplasty (Left, 03/30/2019); Ultrasound guidance for vascular access (Right, 03/30/2019); Femoral artery debridement (03/30/2019); Insertion of iliac stent (03/30/2019); Groin debridement (Left, 04/27/2019); Application if wound vac (Left, 04/27/2019); LOWER EXTREMITY VENOGRAPHY (Left, 08/16/2019); PERIPHERAL VASCULAR INTERVENTION (Left, 08/16/2019); Back surgery; Colonoscopy with propofol (N/A, 11/22/2019); Knee arthroscopy with meniscal repair (Left, 11/14/2020); LOWER EXTREMITY VENOGRAPHY (Left, 03/25/2022); PERIPHERAL VASCULAR THROMBECTOMY (Left, 03/25/2022); PERIPHERAL VASCULAR INTERVENTION (Left, 03/25/2022); and Colonoscopy with propofol (N/A, 12/09/2022). Family: family history includes ADD / ADHD in her daughter; Anxiety disorder in her daughter; Bipolar disorder in her daughter, maternal aunt, and mother; Depression in her mother; Diabetes in her father; Drug abuse in her daughter and daughter; Heart disease in her  brother, father, and mother; Hyperlipidemia in her brother, father, and mother; Hypertension in her brother, father, mother, and sister; Obesity in her father and mother; Sleep apnea in her father and mother; Stroke in her mother; Suicidality in her maternal aunt.  Laboratory Chemistry Profile   Renal Lab Results  Component Value Date   BUN 34 (H) 01/30/2023   CREATININE 1.38 (H) 01/30/2023   BCR 24 (H) 11/07/2022   GFRAA 53 (L) 07/18/2020   GFRNONAA 45 (L) 01/30/2023     Hepatic Lab Results  Component Value Date   AST 12 11/07/2022   ALT 16 11/07/2022   ALBUMIN 4.6 11/07/2022   ALKPHOS 102 11/07/2022   HCVAB NON REACTIVE 11/06/2021     Electrolytes Lab Results  Component Value Date   NA 137 01/30/2023   K 3.5 01/30/2023   CL 99 01/30/2023   CALCIUM 9.4 01/30/2023   MG 2.1 08/06/2022   PHOS 4.0 04/04/2019     Bone Lab Results  Component Value Date   VD25OH 47.10 02/21/2022     Inflammation (CRP: Acute Phase) (ESR: Chronic Phase) Lab Results  Component Value Date   CRP 11 (H) 02/16/2020   ESRSEDRATE 33 02/16/2020   LATICACIDVEN 1.2 01/05/2020       Note: Above Lab results reviewed.  Recent Imaging Review  MR BRAIN WO CONTRAST CLINICAL DATA:  Numbness and tingling  EXAM: MRI HEAD WITHOUT CONTRAST  TECHNIQUE: Multiplanar, multiecho pulse sequences of  the brain and surrounding structures were obtained without intravenous contrast.  COMPARISON:  11/27/2022 MRI  FINDINGS: Brain: No restricted diffusion to suggest acute or subacute infarct. No acute hemorrhage, mass, mass effect, or midline shift. No hydrocephalus or extra-axial collection. Partial empty sella. Normal craniocervical junction.  No hemosiderin deposition to suggest remote hemorrhage. Normal cerebral volume for age.  Vascular: Normal arterial flow voids.  Skull and upper cervical spine: Normal marrow signal.  Sinuses/Orbits: Clear paranasal sinuses. No acute finding in  the orbits.  Other: The mastoid air cells are well aerated.  IMPRESSION: No acute intracranial process. No evidence of acute or subacute infarct.  Electronically Signed   By: Wiliam Ke M.D.   On: 01/30/2023 15:35 CT HEAD WO CONTRAST CLINICAL DATA:  Diplopia. Blurry vision and near syncope. Weakness.  EXAM: CT HEAD WITHOUT CONTRAST  TECHNIQUE: Contiguous axial images were obtained from the base of the skull through the vertex without intravenous contrast.  RADIATION DOSE REDUCTION: This exam was performed according to the departmental dose-optimization program which includes automated exposure control, adjustment of the mA and/or kV according to patient size and/or use of iterative reconstruction technique.  COMPARISON:  MR brain 11/27/2022  FINDINGS: Brain: No evidence of acute infarction, hemorrhage, hydrocephalus, extra-axial collection or mass lesion/mass effect.  Vascular: No hyperdense vessel or unexpected calcification.  Skull: Normal. Negative for fracture or focal lesion.  Sinuses/Orbits: No acute finding.  Other: None.  IMPRESSION: No acute intracranial pathology.  Electronically Signed   By: Signa Kell M.D.   On: 01/30/2023 12:17  Note: Reviewed        Physical Exam  General appearance: Well nourished, well developed, and well hydrated. In no apparent acute distress Mental status: Alert, oriented x 3 (person, place, & time)       Respiratory: No evidence of acute respiratory distress Eyes: PERLA Vitals: BP 118/74 (BP Location: Right Arm, Patient Position: Sitting, Cuff Size: Normal)   Pulse 63   Temp (!) 97.4 F (36.3 C) (Temporal)   Resp 16   Ht 5\' 10"  (1.778 m)   Wt 223 lb (101.2 kg)   LMP  (LMP Unknown)   SpO2 98%   BMI 32.00 kg/m  BMI: Estimated body mass index is 32 kg/m as calculated from the following:   Height as of this encounter: 5\' 10"  (1.778 m).   Weight as of this encounter: 223 lb (101.2 kg). Ideal: Ideal body  weight: 68.5 kg (151 lb 0.2 oz) Adjusted ideal body weight: 81.6 kg (179 lb 12.9 oz)   Lumbar Spine Area Exam  Skin & Axial Inspection: Well healed scar from previous spine surgery detected Alignment: Asymmetric Functional ROM: Decreased ROM       Stability: No instability detected Muscle Tone/Strength: Functionally intact. No obvious neuro-muscular anomalies detected. Sensory (Neurological): Dermatomal pain pattern     Gait & Posture Assessment  Ambulation: Limited Gait: Antalgic gait (limping) Posture: Difficulty standing up straight, due to pain  Lower Extremity Exam      Side: Right lower extremity   Side: Left lower extremity  Stability: No instability observed           Stability: No instability observed          Skin & Extremity Inspection: Skin color, temperature, and hair growth are WNL. No peripheral edema or cyanosis. No masses, redness, swelling, asymmetry, or associated skin lesions. No contractures.   Skin & Extremity Inspection: Skin color, temperature, and hair growth are WNL. No peripheral edema or cyanosis.  No masses, redness, swelling, asymmetry, or associated skin lesions. No contractures.  Functional ROM: Decreased ROM for hip and knee joints           Functional ROM: Decreased ROM for hip and knee joints, pain with weight bearing and lateral rotation          Muscle Tone/Strength: Functionally intact. No obvious neuro-muscular anomalies detected.   Muscle Tone/Strength: Functionally intact. No obvious neuro-muscular anomalies detected.  Sensory (Neurological): Neurogenic pain pattern        Sensory (Neurological):  Arthropathic arthralgia for left knee severe pain with weightbearing        DTR: Patellar: deferred today Achilles: deferred today Plantar: deferred today   DTR: Patellar: deferred today Achilles: deferred today Plantar: deferred today  Palpation: No palpable anomalies   Palpation: No palpable anomalies    Assessment   Status Diagnosis   Persistent Persistent Persistent 1. Diabetic peripheral neuropathy (HCC)   2. Chronic bilateral thoracic back pain   3. Failed back surgical syndrome   4. Neurogenic pain   5. Pain management contract signed   6. Arthritis of left knee   7. DDD (degenerative disc disease), lumbar   8. May-Thurner syndrome   9. Chronic pain syndrome   10. Chronic musculoskeletal pain          Plan of Care  [name] has a current medication list which includes the following long-term medication(s): amphetamine-dextroamphetamine, amphetamine-dextroamphetamine, amphetamine-dextroamphetamine, atorvastatin, cetirizine, colchicine, fluoxetine, levothyroxine, linzess, pantoprazole, potassium chloride, pregabalin, ropinirole, trazodone, and warfarin.  Pharmacotherapy (Medications Ordered): Meds ordered this encounter  Medications   HYDROcodone-acetaminophen (NORCO) 10-325 MG tablet    Sig: Take 1 tablet by mouth every 6 (six) hours as needed. To last 30 days from fill date    Dispense:  120 tablet    Refill:  0   HYDROcodone-acetaminophen (NORCO) 10-325 MG tablet    Sig: Take 1 tablet by mouth every 6 (six) hours as needed. To last 30 days from fill date    Dispense:  120 tablet    Refill:  0   HYDROcodone-acetaminophen (NORCO) 10-325 MG tablet    Sig: Take 1 tablet by mouth every 6 (six) hours as needed. To last 30 days from fill date    Dispense:  120 tablet    Refill:  0   methocarbamol (ROBAXIN) 750 MG tablet    Sig: Take 1 tablet (750 mg total) by mouth 2 (two) times daily as needed for muscle spasms.    Dispense:  60 tablet    Refill:  5    Do not place this medication, or any other prescription from our practice, on "Automatic Refill". Patient may have prescription filled one day early if pharmacy is closed on scheduled refill date.   Continue Lyrica as prescribed Continue with SCS  No orders of the defined types were placed in this encounter.   Follow-up plan:   Return in  about 3 months (around 06/19/2023) for Medication Management, in person.   Recent Visits Date Type Provider Dept  12/19/22 Office Visit Edward Jolly, MD [facility]  Showing recent visits within past 90 days and meeting all other requirements Today's Visits Date Type Provider Dept  03/11/23 Office Visit Edward Jolly, MD [facility]  Showing today's visits and meeting all other requirements Future Appointments No visits were found meeting these conditions. Showing future appointments within next 90 days and meeting all other requirements  I discussed the assessment and treatment plan with the  patient. The patient was provided an opportunity to ask questions and all were answered. The patient agreed with the plan and demonstrated an understanding of the instructions.  Patient advised to call back or seek an in-person evaluation if the symptoms or condition worsens.  Duration of encounter: .  Note by: Edward Jolly, MD Date: 03/11/2023; Time: 9:02 AM

## 2023-03-12 ENCOUNTER — Ambulatory Visit (INDEPENDENT_AMBULATORY_CARE_PROVIDER_SITE_OTHER): Payer: Medicaid Other

## 2023-03-12 ENCOUNTER — Ambulatory Visit: Payer: Medicaid Other | Admitting: Urology

## 2023-03-12 DIAGNOSIS — Z86718 Personal history of other venous thrombosis and embolism: Secondary | ICD-10-CM | POA: Diagnosis not present

## 2023-03-12 LAB — POCT INR
INR: 2.4 (ref 2.0–3.0)
POC INR: 2.4
PT: 29.1

## 2023-03-12 NOTE — Patient Instructions (Signed)
Description   5 mg daily F/U in 4 weeks     

## 2023-03-14 ENCOUNTER — Ambulatory Visit: Payer: Medicaid Other | Admitting: Family Medicine

## 2023-03-14 ENCOUNTER — Encounter: Payer: Self-pay | Admitting: Family Medicine

## 2023-03-14 VITALS — BP 109/71 | HR 60 | Temp 97.8°F | Resp 12 | Wt 230.0 lb

## 2023-03-14 DIAGNOSIS — N3090 Cystitis, unspecified without hematuria: Secondary | ICD-10-CM | POA: Diagnosis not present

## 2023-03-14 DIAGNOSIS — E559 Vitamin D deficiency, unspecified: Secondary | ICD-10-CM | POA: Diagnosis not present

## 2023-03-14 DIAGNOSIS — G4733 Obstructive sleep apnea (adult) (pediatric): Secondary | ICD-10-CM

## 2023-03-14 DIAGNOSIS — R5383 Other fatigue: Secondary | ICD-10-CM

## 2023-03-14 LAB — POCT URINALYSIS DIPSTICK
Bilirubin, UA: NEGATIVE
Glucose, UA: NEGATIVE
Ketones, UA: NEGATIVE
Nitrite, UA: NEGATIVE
Protein, UA: POSITIVE — AB
Spec Grav, UA: 1.015 (ref 1.010–1.025)
Urobilinogen, UA: 0.2 E.U./dL
pH, UA: 6 (ref 5.0–8.0)

## 2023-03-14 MED ORDER — SULFAMETHOXAZOLE-TRIMETHOPRIM 800-160 MG PO TABS: 1.0000 | ORAL_TABLET | Freq: Two times a day (BID) | ORAL | 0 refills | Status: AC

## 2023-03-14 NOTE — Progress Notes (Signed)
I,Sulibeya S Dimas,acting as a Neurosurgeon for Shirlee Latch, MD.,have documented all relevant documentation on the behalf of Shirlee Latch, MD,as directed by  Shirlee Latch, MD while in the presence of Shirlee Latch, MD.     Established patient visit   Patient: Jane [name]   DOB: Nov 14, 1965   57 y.o. Female  MRN: [id number] Visit Date: 03/14/2023  Today's healthcare provider: Shirlee Latch, MD   Chief Complaint  Patient presents with   Urinary Tract Infection   Fatigue   Subjective    Urinary Tract Infection  This is a new problem. The current episode started 1 to 4 weeks ago (02/17/23). The problem occurs every urination. The problem has been unchanged. The maximum temperature recorded prior to her arrival was 100 - 100.9 F. The fever has been present for Less than 1 day. There is A history of pyelonephritis. Associated symptoms include chills, a discharge, flank pain, frequency, hematuria and nausea. Pertinent negatives include no vomiting. She has tried antibiotics for the symptoms. The treatment provided no relief. Her past medical history is significant for recurrent UTIs.    Patient C/O fatigue x two months. She is requesting vitamin d deficiency. She reports her cardiologist recommended to have vit d level checked. She reports sleeping about 6.5 hours most nights. app on her watch reports that she only has 1.43 of deep sleep. She reports wearing her CPAP was making her have dry mouth, so she stopped wearing the CPAP. She reports that even with CPAP she was still feeling fatigue in the morning.   Medications: Outpatient Medications Prior to Visit  Medication Sig   ALPRAZolam (XANAX) 1 MG tablet Take 1 tablet (1 mg total) by mouth 2 (two) times daily as needed. for anxiety   amphetamine-dextroamphetamine (ADDERALL) 30 MG tablet Take 1 tablet by mouth 2 (two) times daily.   amphetamine-dextroamphetamine (ADDERALL) 30 MG tablet Take 1 tablet by mouth 2 (two) times  daily.   amphetamine-dextroamphetamine (ADDERALL) 30 MG tablet Take 1 tablet by mouth 2 (two) times daily.   aspirin EC 81 MG tablet Take 81 mg by mouth daily.   atorvastatin (LIPITOR) 40 MG tablet TAKE 1 TABLET BY MOUTH EVERY DAY   cetirizine (ZYRTEC) 10 MG tablet TAKE 1 TABLET BY MOUTH DAILY   colchicine 0.6 MG tablet TAKE 2 TABLETS, TAKE 1 TABLET BY MOUTH 1 hour AFTER, THEN TAKE 1 TABLET EVERY DAY FOR 7 DAYS   famotidine (PEPCID) 20 MG tablet Take 20 mg by mouth daily.   FLUoxetine (PROZAC) 40 MG capsule Take 1 capsule (40 mg total) by mouth 2 (two) times daily.   GARLIC PO Take 1 tablet by mouth daily.   [START ON 03/26/2023] HYDROcodone-acetaminophen (NORCO) 10-325 MG tablet Take 1 tablet by mouth every 6 (six) hours as needed. To last 30 days from fill date   [START ON 04/25/2023] HYDROcodone-acetaminophen (NORCO) 10-325 MG tablet Take 1 tablet by mouth every 6 (six) hours as needed. To last 30 days from fill date   [START ON 05/25/2023] HYDROcodone-acetaminophen (NORCO) 10-325 MG tablet Take 1 tablet by mouth every 6 (six) hours as needed. To last 30 days from fill date   ketoconazole (NIZORAL) 2 % shampoo Apply topically daily.   levothyroxine (SYNTHROID) 125 MCG tablet Take 125 mcg by mouth daily before breakfast.   LINZESS 145 MCG CAPS capsule TAKE ONE CAPSULE BY MOUTH DAILY   methocarbamol (ROBAXIN) 750 MG tablet Take 1 tablet (750 mg total) by mouth 2 (two) times daily as needed  for muscle spasms.   Multiple Vitamin (MULTIVITAMIN) capsule Take 1 capsule by mouth daily.   omega-3 acid ethyl esters (LOVAZA) 1 g capsule Take by mouth.   pantoprazole (PROTONIX) 40 MG tablet TAKE 1 TABLET BY MOUTH DAILY   polyethylene glycol powder (GLYCOLAX/MIRALAX) 17 GM/SCOOP powder Take 17 g by mouth daily.   potassium chloride (KLOR-CON M) 10 MEQ tablet TAKE 1 TABLET BY MOUTH THREE TIMES DAILY   pregabalin (LYRICA) 100 MG capsule TAKE ONE CAPSULE BY MOUTH FOUR TIMES DAILY   rOPINIRole (REQUIP) 2 MG  tablet TAKE 1 TABLET BY MOUTH TWICE DAILY   Semaglutide, 1 MG/DOSE, 4 MG/3ML SOPN Inject 1 mg as directed once a week.   traZODone (DESYREL) 150 MG tablet Take 1 tablet (150 mg total) by mouth at bedtime.   valACYclovir (VALTREX) 1000 MG tablet Take 2 tablets by mouth at first onset of fever blisters, then take 2 tablets by mouth 12 hours later.   Vibegron (GEMTESA) 75 MG TABS Take 1 tablet (75 mg total) by mouth daily.   warfarin (COUMADIN) 5 MG tablet Take 1 tablet (5 mg total) by mouth daily. (Patient taking differently: Take 5 mg by mouth daily. PCP manages.)   No facility-administered medications prior to visit.    Review of Systems  Constitutional:  Positive for chills and fatigue.  Respiratory:  Negative for cough and shortness of breath.   Cardiovascular:  Positive for leg swelling. Negative for chest pain.  Gastrointestinal:  Positive for constipation and nausea. Negative for abdominal pain, blood in stool, diarrhea and vomiting.  Genitourinary:  Positive for dysuria, flank pain, frequency and hematuria.  Psychiatric/Behavioral:  Positive for sleep disturbance.        Objective    BP 109/71 (BP Location: Left Arm, Patient Position: Sitting, Cuff Size: Large)   Pulse 60   Temp 97.8 F (36.6 C) (Temporal)   Resp 12   Wt 230 lb (104.3 kg)   LMP  (LMP Unknown)   SpO2 96%   BMI 33.00 kg/m  BP Readings from Last 3 Encounters:  03/14/23 109/71  03/11/23 118/74  02/20/23 106/66   Wt Readings from Last 3 Encounters:  03/14/23 230 lb (104.3 kg)  03/11/23 223 lb (101.2 kg)  02/20/23 232 lb (105.2 kg)      Physical Exam Constitutional:      General: She is not in acute distress.    Appearance: Normal appearance.  HENT:     Head: Normocephalic.  Pulmonary:     Effort: Pulmonary effort is normal. No respiratory distress.  Neurological:     Mental Status: She is alert and oriented to person, place, and time. Mental status is at baseline.       Results for orders  placed or performed in visit on 03/14/23  POCT urinalysis dipstick  Result Value Ref Range   Color, UA yellow    Clarity, UA clear    Glucose, UA Negative Negative   Bilirubin, UA Negative    Ketones, UA Negative    Spec Grav, UA 1.015 1.010 - 1.025   Blood, UA moderate    pH, UA 6.0 5.0 - 8.0   Protein, UA Positive (A) Negative   Urobilinogen, UA 0.2 0.2 or 1.0 E.U./dL   Nitrite, UA Negative    Leukocytes, UA Moderate (2+) (A) Negative    Assessment & Plan     1. Cystitis - Symptoms and UA consistent with UTI -No systemic symptoms or signs of pyelonephritis - Chronic, known hematuria -  no further w/u needed -Will start treatment with 5day course of Bactrim after reviewing previous urine culture - patient was told be UC that she was not sensitive to Keflex and was switched to Augmentin, which has not improved her symptoms - she is on Warfarin, so we need to be careful with abx selection, but with resistance on last culture and patients report of most recent, we are left with no options that will not affect INR - will have to recheck it in 1 wk - sooner than planned -We will send urine culture to confirm sensitivities -Discussed return precautions  - POCT urinalysis dipstick - Urine Culture  2. Vitamin D deficiency 3. Other fatigue Recheck level - likely cause of fatigue Other recent labs, including iron and Hgb well controlled - VITAMIN D 25 Hydroxy (Vit-D Deficiency, Fractures)  4. OSA (obstructive sleep apnea) - patient is going to check with supplier to try other masks to see if she can improve her dry mouth   Return for as scheduled.      I, Shirlee Latch, MD, have reviewed all documentation for this visit. The documentation on 03/14/23 for the exam, diagnosis, procedures, and orders are all accurate and complete.   Lenorris Karger, Marzella Schlein, MD, MPH [facility]

## 2023-03-15 LAB — VITAMIN D 25 HYDROXY (VIT D DEFICIENCY, FRACTURES): Vit D, 25-Hydroxy: 48.8 ng/mL (ref 30.0–100.0)

## 2023-03-16 ENCOUNTER — Other Ambulatory Visit: Payer: Self-pay | Admitting: Family Medicine

## 2023-03-17 ENCOUNTER — Encounter (INDEPENDENT_AMBULATORY_CARE_PROVIDER_SITE_OTHER): Payer: Self-pay | Admitting: Family Medicine

## 2023-03-17 ENCOUNTER — Ambulatory Visit (INDEPENDENT_AMBULATORY_CARE_PROVIDER_SITE_OTHER): Payer: Medicaid Other | Admitting: Family Medicine

## 2023-03-17 VITALS — BP 108/62 | HR 64 | Temp 97.6°F | Ht 70.0 in | Wt 223.0 lb

## 2023-03-17 DIAGNOSIS — N390 Urinary tract infection, site not specified: Secondary | ICD-10-CM | POA: Diagnosis not present

## 2023-03-17 DIAGNOSIS — Z6832 Body mass index (BMI) 32.0-32.9, adult: Secondary | ICD-10-CM

## 2023-03-17 DIAGNOSIS — E669 Obesity, unspecified: Secondary | ICD-10-CM | POA: Diagnosis not present

## 2023-03-17 DIAGNOSIS — I951 Orthostatic hypotension: Secondary | ICD-10-CM | POA: Diagnosis not present

## 2023-03-17 NOTE — Progress Notes (Signed)
Chief Complaint:   OBESITY Jane [name] is here to discuss her progress with her obesity treatment plan along with follow-up of her obesity related diagnoses. [name] is on the Category 3 Plan and states she is following her eating plan approximately 75% of the time. [name] states she is walking 11,000 steps 7 times per week, and at the gym for 90 minutes 3 times per week.  Today's visit was #: 38 Starting weight: 283 lbs Starting date: 03/29/2020 Today's weight: 223 lbs Today's date: 03/17/2023 Total lbs lost to date: 60 Total lbs lost since last in-office visit: 0  Interim History: Patient ran out of food this past month so had to each what was available.  She had to portion control choices. She is looking for cheaper food options.  She has some fruit and vegetables in her garden coming in.  She is looking for cheaper food alternatives.  She has been on her 3rd round of antibiotics for urinary tract infection.    Subjective:   1. Orthostatic hypotension Jane [name] was taken off her blood pressure medications by cardiology.  Her Holter monitor results are pending and she has an echocardiogram tomorrow.  2. Recurrent UTI Jane [name] is on Bactrim now, third antibiotic.  Her urine culture is pending.  Assessment/Plan:   1. Orthostatic hypotension We will follow-up on her results at her next appointment.  2. Recurrent UTI Jane [name] will follow-up with urology for further management.  3. BMI 32.0-32.9,adult  4. Obesity with starting BMI of 43.0 Jane [name] is currently in the action stage of change. As such, her goal is to continue with weight loss efforts. She has agreed to the Category 3 Plan.   Exercise goals: As is.   Behavioral modification strategies: increasing lean protein intake, meal planning and cooking strategies, keeping healthy foods in the home, and planning for success.  Jane [name] has agreed to follow-up with our clinic in 6 weeks. She was informed of the importance of frequent follow-up  visits to maximize her success with intensive lifestyle modifications for her multiple health conditions.   Objective:   Blood pressure 108/62, pulse 64, temperature 97.6 F (36.4 C), height 5\' 10"  (1.778 m), weight 223 lb (101.2 kg), SpO2 99 %. Body mass index is 32 kg/m.  General: Cooperative, alert, well developed, in no acute distress. HEENT: Conjunctivae and lids unremarkable. Cardiovascular: Regular rhythm.  Lungs: Normal work of breathing. Neurologic: No focal deficits.   Lab Results  Component Value Date   CREATININE 1.38 (H) 01/30/2023   BUN 34 (H) 01/30/2023   NA 137 01/30/2023   K 3.5 01/30/2023   CL 99 01/30/2023   CO2 27 01/30/2023   Lab Results  Component Value Date   ALT 16 11/07/2022   AST 12 11/07/2022   ALKPHOS 102 11/07/2022   BILITOT 0.3 11/07/2022   Lab Results  Component Value Date   HGBA1C 5.9 (H) 11/07/2022   HGBA1C 5.8 (A) 04/22/2022   HGBA1C 6.2 (H) 11/06/2021   HGBA1C 6.3 (H) 03/22/2021   HGBA1C 5.8 (A) 10/24/2020   Lab Results  Component Value Date   INSULIN 21.5 03/29/2020   Lab Results  Component Value Date   TSH 1.440 11/07/2022   Lab Results  Component Value Date   CHOL 187 11/07/2022   HDL 33 (L) 11/07/2022   LDLCALC 105 (H) 11/07/2022   TRIG 285 (H) 11/07/2022   CHOLHDL 5.7 (H) 11/07/2022   Lab Results  Component Value Date   VD25OH 48.8 03/14/2023  VD25OH 47.10 02/21/2022   VD25OH 50.60 01/01/2021   Lab Results  Component Value Date   WBC 4.8 02/24/2023   HGB 13.5 02/24/2023   HCT 40.8 02/24/2023   MCV 94.9 02/24/2023   PLT 152 02/24/2023   Lab Results  Component Value Date   IRON 93 02/24/2023   TIBC 322 02/24/2023   FERRITIN 214 02/24/2023   Attestation Statements:   Reviewed by clinician on day of visit: allergies, medications, problem list, medical history, surgical history, family history, social history, and previous encounter notes.   I, Burt Knack, am acting as transcriptionist for  Reuben Likes, MD.  I have reviewed the above documentation for accuracy and completeness, and I agree with the above. - Reuben Likes, MD

## 2023-03-18 ENCOUNTER — Ambulatory Visit: Payer: Medicaid Other | Attending: Internal Medicine

## 2023-03-18 DIAGNOSIS — R42 Dizziness and giddiness: Secondary | ICD-10-CM | POA: Diagnosis present

## 2023-03-18 LAB — URINE CULTURE

## 2023-03-19 LAB — ECHOCARDIOGRAM COMPLETE
AR max vel: 2.21 cm2
AV Area VTI: 2.13 cm2
AV Area mean vel: 2.07 cm2
AV Mean grad: 5.5 mmHg
AV Peak grad: 11.1 mmHg
Ao pk vel: 1.67 m/s
Area-P 1/2: 3.42 cm2
Calc EF: 69.3 %
MV M vel: 3.14 m/s
MV Peak grad: 39.4 mmHg
S' Lateral: 2.5 cm
Single Plane A2C EF: 69 %
Single Plane A4C EF: 68 %

## 2023-03-19 NOTE — Progress Notes (Unsigned)
03/20/23 2:27 PM   Jane [name] [birth year] [id number]  Referring provider:  Erasmo Downer, MD [address] [place],  [place] [address] Chief Complaint  Patient presents with   Over Active Bladder   Hematuria   Urological history 1. OAB -Contributing factors of age, vaginal atrophy, obesity, anxiety, constipation, diabetes, back pain, hypertension, pelvic surgery and diuretics. -cysto (09/2021) - NED  -Gemtesa 75 mg daily  HPI: Jane [name] is a 57 y.o.female  with a personal history of chronic kidney disease stage III, acute cystitis and OAB , who presents today for a 6 week follow-up after restarting her Gemtesa.    At her visit on 02/28/2023, she is having 1-7 daytime urinations, 3 or more episodes of nocturia with a strong urge to urinate.  She is having incontinence with both urge and stress.  She is leaking 3 or more times daily.  She is going through 5 or more absorbent pads daily.  She does engage in toilet mapping.  Patient denies any modifying or aggravating factors.  Patient denies any gross hematuria, dysuria or suprapubic/flank pain.  Patient denies any fevers, chills, nausea or vomiting.  UA yellow clear, specific gravity 1.015, trace blood, pH 6.5, 0-5 WBCs, 3-10 RBCs, 0-10 epithelial cells and moderate bacteria.  PVR 15 mL.  Her urine culture was negative.    She states she was seen by an urgent care and diagnosed with a UTI and placed on cephalexin which she was taking twice daily and then she received a call back and was changed to Augmentin twice daily for 7 days.  She states her symptoms did not abate.  She was seen by her PCP for symptoms of frequency, urgency, dysuria, urge incontinence, right-sided flank pain and suprapubic pain.  She was diagnosed with a UTI.  Her culture grew out pansensitive E. coli.  She was initially placed Septra DS for 7 days and her symptoms have not abated.  She is having 8 or more daytime urinations, with  nocturia x 3, strong urge to urinate, she has urge incontinence.  She is having urinary leakage 1-2 times daily.  She is going through 6 absorbent pads a day.  She does engage in restricting fluids.  She does engage in toilet mapping.  UA yellow clear, specific gravity 1.020, trace blood, pH 5.5, 0-5 WBCs, 0-2 RBCs, greater than 10 epithelial cells and moderate bacteria  PVR 26 mL   PMH: Past Medical History:  Diagnosis Date   ADD (attention deficit disorder)    Anxiety    Back pain    Bilateral swelling of feet    Bipolar 1 disorder (HCC)    Bipolar 1 disorder (HCC)    Bipolar disorder (HCC)    Chewing difficulty    Chronic fatigue syndrome    Chronic kidney disease    Stage 3 kidney disease;dx by Dr. Kari Baars.    Constipation    Depression    Diabetes mellitus without complication (HCC)    diet controlled   Dyspnea    with exertion   GERD (gastroesophageal reflux disease)    Headache    migraines   High cholesterol    History of blood clots    History of DVT (deep vein thrombosis)    left leg   Hypertension    states under control with meds., has been on med. x 2 years   Hypothyroidism    Joint pain    Neuropathy    Obsessive-compulsive disorder  Peripheral vascular disease (HCC)    Prediabetes    Respiratory failure requiring intubation (HCC)    Restless leg syndrome    Shortness of breath    Sleep apnea    Thrombocytopenia (HCC) 09/15/2019   Trigger thumb of left hand 01/2018   Trigger thumb of right hand     Surgical History: Past Surgical History:  Procedure Laterality Date   ABDOMINAL HYSTERECTOMY  06/2016   complete   APPLICATION OF WOUND VAC Left 04/27/2019   Procedure: APPLICATION OF WOUND VAC LEFT GROIN;  Surgeon: Maeola Harman, MD;  Location: [facility] OR;  Service: Vascular;  Laterality: Left;   AV FISTULA PLACEMENT Left 11/24/2018   Procedure: ARTERIOVENOUS (AV) FISTULA CREATION LEFT SFA TO LEFT FEMORAL VEIN;  Surgeon: Maeola Harman, MD;  Location: [facility] OR;  Service: Vascular;  Laterality: Left;   BACK SURGERY     CHOLECYSTECTOMY     COLONOSCOPY WITH PROPOFOL N/A 11/22/2019   Procedure: COLONOSCOPY WITH PROPOFOL;  Surgeon: Wyline Mood, MD;  Location: [facility] ENDOSCOPY;  Service: Gastroenterology;  Laterality: N/A;   COLONOSCOPY WITH PROPOFOL N/A 12/09/2022   Procedure: COLONOSCOPY WITH PROPOFOL;  Surgeon: Wyline Mood, MD;  Location: [facility] ENDOSCOPY;  Service: Gastroenterology;  Laterality: N/A;   FEMORAL ARTERY EXPLORATION  03/30/2019   Procedure: Left Common Femoral Artery and Vein Exploration;  Surgeon: Maeola Harman, MD;  Location: [facility] OR;  Service: Vascular;;   FEMORAL-FEMORAL BYPASS GRAFT Left 11/24/2018   Procedure: BYPASS GRAFT FEMORAL-FEMORAL VENOUS LEFT TO RIGHT PALMA PROCEDURE USING CRYOVEIN;  Surgeon: Maeola Harman, MD;  Location: [facility] OR;  Service: Vascular;  Laterality: Left;   GROIN DEBRIDEMENT Left 04/27/2019   Procedure: GROIN DEBRIDEMENT;  Surgeon: Maeola Harman, MD;  Location: [facility] OR;  Service: Vascular;  Laterality: Left;   INSERTION OF ILIAC STENT  03/30/2019   Procedure: Stent of left common, external iliac veins and left common femoral vein;  Surgeon: Maeola Harman, MD;  Location: [facility] OR;  Service: Vascular;;   KNEE ARTHROSCOPY WITH MENISCAL REPAIR Left 11/14/2020   Procedure: LEFT KNEE ARTHROSCOPY WITH PARTIAL MEDIAL MENISCECTOMY;  Surgeon: Vickki Hearing, MD;  Location: [facility]S;  Service: Orthopedics;  Laterality: Left;   LOWER EXTREMITY VENOGRAPHY N/A 08/17/2018   Procedure: LOWER EXTREMITY VENOGRAPHY - Central Venogram;  Surgeon: Maeola Harman, MD;  Location: [facility] INVASIVE CV LAB;  Service: Cardiovascular;  Laterality: N/A;   LOWER EXTREMITY VENOGRAPHY Bilateral 03/09/2019   Procedure: LOWER EXTREMITY VENOGRAPHY;  Surgeon: Maeola Harman, MD;  Location: [facility] INVASIVE CV LAB;  Service: Cardiovascular;  Laterality: Bilateral;   LOWER  EXTREMITY VENOGRAPHY Left 08/16/2019   Procedure: LOWER EXTREMITY VENOGRAPHY;  Surgeon: Maeola Harman, MD;  Location: [facility] INVASIVE CV LAB;  Service: Cardiovascular;  Laterality: Left;   LOWER EXTREMITY VENOGRAPHY Left 03/25/2022   Procedure: LOWER EXTREMITY VENOGRAPHY;  Surgeon: Maeola Harman, MD;  Location: [facility] INVASIVE CV LAB;  Service: Cardiovascular;  Laterality: Left;  IVUS   LUMBAR FUSION  11/21/2000   L5-S1   LUMBAR SPINE SURGERY     x 2 others   PATCH ANGIOPLASTY Left 03/30/2019   Procedure: Patch Angioplasty of the Left Common Femoral Vein using Venosure Biologic patch;  Surgeon: Maeola Harman, MD;  Location: [facility] OR;  Service: Vascular;  Laterality: Left;   PERIPHERAL VASCULAR INTERVENTION Left 08/16/2019   Procedure: PERIPHERAL VASCULAR INTERVENTION;  Surgeon: Maeola Harman, MD;  Location: [facility] INVASIVE CV LAB;  Service: Cardiovascular;  Laterality: Left;  common femoral/femoral  vein stent   PERIPHERAL VASCULAR INTERVENTION Left 03/25/2022   Procedure: PERIPHERAL VASCULAR INTERVENTION;  Surgeon: Maeola Harman, MD;  Location: [facility] INVASIVE CV LAB;  Service: Cardiovascular;  Laterality: Left;  COMMON FEMORAL VEIN   PERIPHERAL VASCULAR THROMBECTOMY Left 03/25/2022   Procedure: PERIPHERAL VASCULAR THROMBECTOMY;  Surgeon: Maeola Harman, MD;  Location: [facility] INVASIVE CV LAB;  Service: Cardiovascular;  Laterality: Left;  EXTREMITY/IVC   TRIGGER FINGER RELEASE Right 12/01/2017   Procedure: RELEASE TRIGGER FINGER/A-1 PULLEY RIGHT THUMB;  Surgeon: Betha Loa, MD;  Location: [facility];  Service: Orthopedics;  Laterality: Right;   TRIGGER FINGER RELEASE Left 01/26/2018   Procedure: LEFT TRIGGER THUMB RELEASE;  Surgeon: Betha Loa, MD;  Location: [facility];  Service: Orthopedics;  Laterality: Left;   ULTRASOUND GUIDANCE FOR VASCULAR ACCESS Right 03/30/2019   Procedure: Ultrasound-guided cannulation right  internal jugular vein;  Surgeon: Maeola Harman, MD;  Location: [facility] OR;  Service: Vascular;  Laterality: Right;    Home Medications:  Allergies as of 03/20/2023       Reactions   Aripiprazole Other (See Comments)   BECOMES  VIOLENT   Seroquel [quetiapine Fumarate] Other (See Comments)   BECOMES VIOLENT   Chlorpromazine Other (See Comments)   SEVERE ANXIETY   Gabapentin Other (See Comments)   NIGHTMARES   Prednisone Hives   Quetiapine         Medication List        Accurate as of Mar 20, 2023  2:27 PM. If you have any questions, ask your nurse or doctor.          ALPRAZolam 1 MG tablet Commonly known as: XANAX Take 1 tablet (1 mg total) by mouth 2 (two) times daily as needed. for anxiety   amphetamine-dextroamphetamine 30 MG tablet Commonly known as: ADDERALL Take 1 tablet by mouth 2 (two) times daily.   amphetamine-dextroamphetamine 30 MG tablet Commonly known as: ADDERALL Take 1 tablet by mouth 2 (two) times daily.   amphetamine-dextroamphetamine 30 MG tablet Commonly known as: Adderall Take 1 tablet by mouth 2 (two) times daily.   aspirin EC 81 MG tablet Take 81 mg by mouth daily.   atorvastatin 40 MG tablet Commonly known as: LIPITOR TAKE 1 TABLET BY MOUTH EVERY DAY   cetirizine 10 MG tablet Commonly known as: ZYRTEC TAKE 1 TABLET BY MOUTH DAILY   colchicine 0.6 MG tablet TAKE 2 TABLETS, TAKE 1 TABLET BY MOUTH 1 hour AFTER, THEN TAKE 1 TABLET EVERY DAY FOR 7 DAYS   famotidine 20 MG tablet Commonly known as: PEPCID Take 20 mg by mouth daily.   FLUoxetine 40 MG capsule Commonly known as: PROZAC Take 1 capsule (40 mg total) by mouth 2 (two) times daily.   GARLIC PO Take 1 tablet by mouth daily.   Gemtesa 75 MG Tabs Generic drug: Vibegron Take 1 tablet (75 mg total) by mouth daily.   HYDROcodone-acetaminophen 10-325 MG tablet Commonly known as: NORCO Take 1 tablet by mouth every 6 (six) hours as needed. To last 30 days from fill  date Start taking on: Mar 26, 2023   HYDROcodone-acetaminophen 10-325 MG tablet Commonly known as: NORCO Take 1 tablet by mouth every 6 (six) hours as needed. To last 30 days from fill date Start taking on: April 25, 2023   HYDROcodone-acetaminophen 10-325 MG tablet Commonly known as: NORCO Take 1 tablet by mouth every 6 (six) hours as needed. To last 30 days from fill date Start taking on: May 25, 2023   ketoconazole 2 % shampoo Commonly known as: NIZORAL Apply topically daily.   levothyroxine 125 MCG tablet Commonly known as: SYNTHROID Take 125 mcg by mouth daily before breakfast.   Linzess 145 MCG Caps capsule Generic drug: linaclotide TAKE ONE CAPSULE BY MOUTH DAILY   methocarbamol 750 MG tablet Commonly known as: ROBAXIN Take 1 tablet (750 mg total) by mouth 2 (two) times daily as needed for muscle spasms.   multivitamin capsule Take 1 capsule by mouth daily.   omega-3 acid ethyl esters 1 g capsule Commonly known as: LOVAZA Take by mouth.   Ozempic (1 MG/DOSE) 4 MG/3ML Sopn Generic drug: Semaglutide (1 MG/DOSE) Inject 1 mg as directed once a week.   pantoprazole 40 MG tablet Commonly known as: PROTONIX TAKE 1 TABLET BY MOUTH DAILY   polyethylene glycol powder 17 GM/SCOOP powder Commonly known as: GLYCOLAX/MIRALAX Take 17 g by mouth daily.   potassium chloride 10 MEQ tablet Commonly known as: KLOR-CON M TAKE 1 TABLET BY MOUTH THREE TIMES DAILY   pregabalin 100 MG capsule Commonly known as: LYRICA TAKE ONE CAPSULE BY MOUTH FOUR TIMES DAILY   rOPINIRole 2 MG tablet Commonly known as: REQUIP TAKE 1 TABLET BY MOUTH TWICE DAILY   traZODone 150 MG tablet Commonly known as: DESYREL Take 1 tablet (150 mg total) by mouth at bedtime.   valACYclovir 1000 MG tablet Commonly known as: VALTREX Take 2 tablets by mouth at first onset of fever blisters, then take 2 tablets by mouth 12 hours later.   warfarin 5 MG tablet Commonly known as: COUMADIN Take as  directed by the anticoagulation clinic. If you are unsure how to take this medication, talk to your nurse or doctor. Original instructions: Take 1 tablet (5 mg total) by mouth daily. What changed: additional instructions        Allergies:  Allergies  Allergen Reactions   Aripiprazole Other (See Comments)    BECOMES  VIOLENT    Seroquel [Quetiapine Fumarate] Other (See Comments)    BECOMES VIOLENT   Chlorpromazine Other (See Comments)    SEVERE ANXIETY    Gabapentin Other (See Comments)    NIGHTMARES    Prednisone Hives   Quetiapine     Family History: Family History  Problem Relation Age of Onset   Heart disease Mother    Hyperlipidemia Mother    Hypertension Mother    Bipolar disorder Mother    Stroke Mother    Depression Mother    Sleep apnea Mother    Obesity Mother    Diabetes Father    Heart disease Father    Hyperlipidemia Father    Hypertension Father    Sleep apnea Father    Obesity Father    Drug abuse Daughter    ADD / ADHD Daughter    Drug abuse Daughter    Anxiety disorder Daughter    Bipolar disorder Daughter    Hypertension Sister    Hypertension Brother    Hyperlipidemia Brother    Heart disease Brother    Bipolar disorder Maternal Aunt    Suicidality Maternal Aunt     Social History:  reports that she has never smoked. She has never used smokeless tobacco. She reports that she does not drink alcohol and does not use drugs.   Physical Exam: BP 98/61   Pulse 62   Ht 5\' 10"  (1.778 m)   Wt 223 lb (101.2 kg)   LMP  (LMP Unknown)   BMI 32.00 kg/m  Constitutional:  Well nourished. Alert and oriented, No acute distress. HEENT: [place] AT, moist mucus membranes.  Trachea midline Cardiovascular: No clubbing, cyanosis, or edema. Respiratory: Normal respiratory effort, no increased work of breathing. Neurologic: Grossly intact, no focal deficits, moving all 4 extremities. Psychiatric: Normal mood and affect.    Laboratory Data: Lab Results   Component Value Date   CREATININE 1.38 (H) 01/30/2023   Lab Results  Component Value Date   HGBA1C 5.9 (H) 11/07/2022    Urinalysis  See EPIC and HPI I have reviewed the labs.   Pertinent imaging:  03/20/23 13:35  Scan Result 36 ml    Assessment & Plan:    Overactive bladder, urgency/frequency  - continue Gemtesa 75 mg daily  2. Microscopic hematuria -UA w/o micro heme on today's UA   3. rUTI's -She has been on 3 different antibiotics in the last month with no resolution of her symptoms -UA with pyuria -Urine sent for culture -We will hold on prescribing another antibiotic until culture results are available -She is having right-sided flank , so we will pursue a CT scan for further evaluation to see if there is a sanctuary site for these recurrent infections  4. Right flank pain -see # 3    Return in about 1 week (around 03/27/2023) for CT results and symptom recheck .   [facility] [address] [place], [place] [address] [phone]

## 2023-03-20 ENCOUNTER — Encounter: Payer: Self-pay | Admitting: Family Medicine

## 2023-03-20 ENCOUNTER — Ambulatory Visit (INDEPENDENT_AMBULATORY_CARE_PROVIDER_SITE_OTHER): Payer: Medicaid Other | Admitting: Family Medicine

## 2023-03-20 ENCOUNTER — Ambulatory Visit (INDEPENDENT_AMBULATORY_CARE_PROVIDER_SITE_OTHER): Payer: Medicaid Other | Admitting: Urology

## 2023-03-20 ENCOUNTER — Encounter: Payer: Self-pay | Admitting: Urology

## 2023-03-20 VITALS — BP 98/61 | HR 62 | Ht 70.0 in | Wt 223.0 lb

## 2023-03-20 DIAGNOSIS — N3281 Overactive bladder: Secondary | ICD-10-CM | POA: Diagnosis not present

## 2023-03-20 DIAGNOSIS — R3129 Other microscopic hematuria: Secondary | ICD-10-CM

## 2023-03-20 DIAGNOSIS — R109 Unspecified abdominal pain: Secondary | ICD-10-CM | POA: Diagnosis not present

## 2023-03-20 DIAGNOSIS — N390 Urinary tract infection, site not specified: Secondary | ICD-10-CM

## 2023-03-20 DIAGNOSIS — Z86718 Personal history of other venous thrombosis and embolism: Secondary | ICD-10-CM | POA: Diagnosis not present

## 2023-03-20 LAB — POCT INR
INR: 2.2 (ref 2.0–3.0)
POC INR: 2.2
PT: 26.6

## 2023-03-20 LAB — BLADDER SCAN AMB NON-IMAGING: Scan Result: 36

## 2023-03-20 NOTE — Progress Notes (Signed)
INR f/u as patient needed Bactrim at last visit  Results for orders placed or performed in visit on 03/20/23 (from the past 24 hour(s))  POCT INR     Status: None   Collection Time: 03/20/23  2:46 PM  Result Value Ref Range   INR 2.2 2.0 - 3.0   POC INR 2.2    PT 26.6    *Note: Due to a large number of results and/or encounters for the requested time period, some results have not been displayed. A complete set of results can be found in Results Review.   Recheck in 1 M  Quintara Bost, Marzella Schlein, MD, MPH [facility]

## 2023-03-20 NOTE — Patient Instructions (Addendum)
Description   5 mg daily F/U in 3 weeks     

## 2023-03-21 LAB — URINALYSIS, COMPLETE
Bilirubin, UA: NEGATIVE
Glucose, UA: NEGATIVE
Ketones, UA: NEGATIVE
Leukocytes,UA: NEGATIVE
Nitrite, UA: NEGATIVE
Protein,UA: NEGATIVE
Specific Gravity, UA: 1.02 (ref 1.005–1.030)
Urobilinogen, Ur: 0.2 mg/dL (ref 0.2–1.0)
pH, UA: 5.5 (ref 5.0–7.5)

## 2023-03-21 LAB — MICROSCOPIC EXAMINATION: Epithelial Cells (non renal): >10 /hpf — AB (ref 0–10)

## 2023-03-24 LAB — CULTURE, URINE COMPREHENSIVE

## 2023-03-26 ENCOUNTER — Encounter: Payer: Self-pay | Admitting: Hematology

## 2023-03-27 ENCOUNTER — Ambulatory Visit: Payer: Medicaid Other | Admitting: Urology

## 2023-03-27 ENCOUNTER — Ambulatory Visit
Admission: RE | Admit: 2023-03-27 | Discharge: 2023-03-27 | Disposition: A | Discharge status: Home / Self Care | Payer: Medicaid Other | Source: Ambulatory Visit | Attending: Urology | Admitting: Urology

## 2023-03-27 DIAGNOSIS — R3129 Other microscopic hematuria: Secondary | ICD-10-CM | POA: Diagnosis present

## 2023-03-27 DIAGNOSIS — R109 Unspecified abdominal pain: Secondary | ICD-10-CM | POA: Insufficient documentation

## 2023-04-02 ENCOUNTER — Encounter: Payer: Self-pay | Admitting: Student in an Organized Health Care Education/Training Program

## 2023-04-02 ENCOUNTER — Ambulatory Visit
Payer: Medicaid Other | Attending: Student in an Organized Health Care Education/Training Program | Admitting: Student in an Organized Health Care Education/Training Program

## 2023-04-02 VITALS — BP 115/65 | HR 57 | Temp 97.2°F | Resp 17 | Ht 70.0 in | Wt 230.0 lb

## 2023-04-02 DIAGNOSIS — E1142 Type 2 diabetes mellitus with diabetic polyneuropathy: Secondary | ICD-10-CM | POA: Diagnosis present

## 2023-04-02 MED ORDER — CAPSAICIN-CLEANSING GEL 8 % EX KIT
4.0000 | PACK | Freq: Once | CUTANEOUS | Status: AC
  Administered 2023-04-02: 4 via TOPICAL
  Filled 2023-04-02: qty 4

## 2023-04-02 NOTE — Progress Notes (Signed)
PROVIDER NOTE: Interpretation of information contained herein should be left to medically-trained personnel. Specific patient instructions are provided elsewhere under "Patient Instructions" section of medical record. This document was created in part using STT-dictation technology, any transcriptional errors that may result from this process are unintentional.  Patient: Jane [name] Type: Established DOB: 1966/10/22 MRN: [id number] PCP: Erasmo Downer, MD  Service: Procedure DOS: 04/02/2023 Setting: Ambulatory Location: Ambulatory outpatient facility Delivery: Face-to-face Provider: Edward Jolly, MD Specialty: Interventional Pain Management Specialty designation: 09 Location: Outpatient facility Ref. Prov.: Bacigalupo, Marzella Schlein, MD       Interventional Therapy   Interventional Treatment:           Type: Qutenza Neurolysis #1  Laterality:  Bilateral Area treated: Feet Imaging Guidance: None Anesthesia/analgesia/anxiolysis/sedation: None required Medication (Right): Qutenza (capsaicin 8%) topical system Medication (Left): Qutenza (capsaicin 8%) topical system Date: 04/02/2023 Performed by: Edward Jolly, MD Rationale (medical necessity): procedure needed and proper for the treatment of Jane [name]'s medical symptoms and needs. 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 1. Diabetic peripheral neuropathy (HCC)    NAS-11 Pain score:   Pre-procedure: 8 /10   Post-procedure: 8 /10     Position / Prep / Materials:  Position: Supine  Materials: Qutenza Kit  Pre-op H&P Assessment:  Jane [name] is a 57 y.o. (year old), female patient, seen today for interventional treatment. She  has a past surgical history that includes Cholecystectomy; Trigger finger release (Right, 12/01/2017); Abdominal hysterectomy (06/2016); Lumbar fusion (11/21/2000); Lumbar spine surgery; Trigger finger release (Left,  01/26/2018); LOWER EXTREMITY VENOGRAPHY (N/A, 08/17/2018); Femoral-femoral Bypass Graft (Left, 11/24/2018); AV fistula placement (Left, 11/24/2018); LOWER EXTREMITY VENOGRAPHY (Bilateral, 03/09/2019); Patch angioplasty (Left, 03/30/2019); Ultrasound guidance for vascular access (Right, 03/30/2019); Femoral artery debridement (03/30/2019); Insertion of iliac stent (03/30/2019); Groin debridement (Left, 04/27/2019); Application if wound vac (Left, 04/27/2019); LOWER EXTREMITY VENOGRAPHY (Left, 08/16/2019); PERIPHERAL VASCULAR INTERVENTION (Left, 08/16/2019); Back surgery; Colonoscopy with propofol (N/A, 11/22/2019); Knee arthroscopy with meniscal repair (Left, 11/14/2020); LOWER EXTREMITY VENOGRAPHY (Left, 03/25/2022); PERIPHERAL VASCULAR THROMBECTOMY (Left, 03/25/2022); PERIPHERAL VASCULAR INTERVENTION (Left, 03/25/2022); and Colonoscopy with propofol (N/A, 12/09/2022). Jane [name] has a current medication list which includes the following prescription(s): alprazolam, amphetamine-dextroamphetamine, amphetamine-dextroamphetamine, amphetamine-dextroamphetamine, aspirin ec, atorvastatin, cetirizine, colchicine, famotidine, fluoxetine, garlic, hydrocodone-acetaminophen, [START ON 04/25/2023] hydrocodone-acetaminophen, [START ON 05/25/2023] hydrocodone-acetaminophen, ketoconazole, levothyroxine, linzess, methocarbamol, multivitamin, omega-3 acid ethyl esters, pantoprazole, polyethylene glycol powder, potassium chloride, pregabalin, ropinirole, semaglutide (1 mg/dose), trazodone, valacyclovir, gemtesa, and warfarin. Her primarily concern today is the Foot Pain (bilat)  Initial Vital Signs:  Pulse/HCG Rate: 62  Temp: (!) 97.5 F (36.4 C) Resp: 18 BP: 114/73 SpO2: 96 %  BMI: Estimated body mass index is 33 kg/m as calculated from the following:   Height as of this encounter: 5\' 10"  (1.778 m).   Weight as of this encounter: 230 lb (104.3 kg).  Risk Assessment: Allergies: Reviewed. She is allergic to aripiprazole, seroquel  [quetiapine fumarate], chlorpromazine, gabapentin, prednisone, and quetiapine.  Allergy Precautions: None required Coagulopathies: Reviewed. None identified.  Blood-thinner therapy: None at this time Active Infection(s): Reviewed. None identified. Jane [name] is afebrile  Site Confirmation: Jane [name] was asked to confirm the procedure and laterality before marking the site Procedure checklist: Completed Consent: Before the procedure and under the influence of no sedative(s), amnesic(s), or anxiolytics, the patient was informed of the treatment options, risks and possible complications. To fulfill our ethical and legal obligations, as recommended by the American Medical Association's Code of  Ethics, I have informed the patient of my clinical impression; the nature and purpose of the treatment or procedure; the risks, benefits, and possible complications of the intervention; the alternatives, including doing nothing; the risk(s) and benefit(s) of the alternative treatment(s) or procedure(s); and the risk(s) and benefit(s) of doing nothing. The patient was provided information about the general risks and possible complications associated with the procedure. These may include, but are not limited to: failure to achieve desired goals, infection, bleeding, organ or nerve damage, allergic reactions, paralysis, and death. In addition, the patient was informed of those risks and complications associated to the procedure, such as failure to decrease pain; infection; bleeding; organ or nerve damage with subsequent damage to sensory, motor, and/or autonomic systems, resulting in permanent pain, numbness, and/or weakness of one or several areas of the body; allergic reactions; (i.e.: anaphylactic reaction); and/or death. Furthermore, the patient was informed of those risks and complications associated with the medications. These include, but are not limited to: allergic reactions (i.e.: anaphylactic or  anaphylactoid reaction(s)); adrenal axis suppression; blood sugar elevation that in diabetics may result in ketoacidosis or comma; water retention that in patients with history of congestive heart failure may result in shortness of breath, pulmonary edema, and decompensation with resultant heart failure; weight gain; swelling or edema; medication-induced neural toxicity; particulate matter embolism and blood vessel occlusion with resultant organ, and/or nervous system infarction; and/or aseptic necrosis of one or more joints. Finally, the patient was informed that Medicine is not an exact science; therefore, there is also the possibility of unforeseen or unpredictable risks and/or possible complications that may result in a catastrophic outcome. The patient indicated having understood very clearly. We have given the patient no guarantees and we have made no promises. Enough time was given to the patient to ask questions, all of which were answered to the patient's satisfaction. Ms. Peretz has indicated that she wanted to continue with the procedure. Attestation: I, the ordering provider, attest that I have discussed with the patient the benefits, risks, side-effects, alternatives, likelihood of achieving goals, and potential problems during recovery for the procedure that I have provided informed consent. Date  Time: 04/02/2023  9:30 AM  Pre-Procedure Preparation:  Monitoring: As per clinic protocol. Respiration, ETCO2, SpO2, BP, heart rate and rhythm monitor placed and checked for adequate function Safety Precautions: Patient was assessed for positional comfort and pressure points before starting the procedure. Time-out: I initiated and conducted the "Time-out" before starting the procedure, as per protocol. The patient was asked to participate by confirming the accuracy of the "Time Out" information. Verification of the correct person, site, and procedure were performed and confirmed by me, the nursing  staff, and the patient. "Time-out" conducted as per Joint Commission's Universal Protocol (UP.01.01.01). Time: 0950 Start Time: 0951 hrs.  Description/Narrative of Procedure:          Region: Distal lower extremity Target Area: Sensory peripheral nerves affected by diabetic peripheral neuropathy Site: Feet Approach: Percutaneous  No./Series: Not applicable  Type: Percutaneous  Purpose: Therapeutic  Region: Distal lower extremities  Start Time: 0951 hrs.  Description of the Procedure: Protocol guidelines were followed. The patient was assisted into a comfortable position.  Informed consent was obtained in the patient monitored in the usual manner.  All questions were answered prior to the procedure.  They Qutenza patches were applied to the affected area and then covered with the wrap.  The Patient was kept under observation until the treatment was completed.  The patches  were removed and the treated area was inspected.  Vitals:   04/02/23 0938 04/02/23 1045  BP: 114/73 115/65  Pulse: 62 (!) 57  Resp: 18 17  Temp: (!) 97.5 F (36.4 C) (!) 97.2 F (36.2 C)  TempSrc: Temporal Temporal  SpO2: 96% 98%  Weight: 230 lb (104.3 kg)   Height: 5\' 10"  (1.778 m)      End Time: 1044 hrs.  Imaging Guidance:          Type of Imaging Technique: None used Indication(s): N/A Exposure Time: No patient exposure Contrast: None used. Fluoroscopic Guidance: N/A Ultrasound Guidance: N/A Interpretation: N/A  Post-operative Assessment:  Post-procedure Vital Signs:  Pulse/HCG Rate: (!) 57  Temp: (!) 97.2 F (36.2 C) Resp: 17 BP: 115/65 SpO2: 98 %  EBL: None  Complications: No immediate post-treatment complications observed by team, or reported by patient.  Note: The patient tolerated the entire procedure well. A repeat set of vitals were taken after the procedure and the patient was kept under observation following institutional policy, for this type of procedure. Post-procedural  neurological assessment was performed, showing return to baseline, prior to discharge. The patient was provided with post-procedure discharge instructions, including a section on how to identify potential problems. Should any problems arise concerning this procedure, the patient was given instructions to immediately contact us, at any time, without hesitation. In any case, we plan to contact the patient by telephone for a follow-up status report regarding this interventional procedure.  Comments:  No additional relevant information.  Plan of Care (POC)  Orders:  No orders of the defined types were placed in this encounter.  Chronic Opioid Analgesic:  Hydrocodone 10 mg 4 times daily as needed, quantity 120/month; MME equals 40   Medications ordered for procedure: Meds ordered this encounter  Medications   capsaicin topical system 8 % patch 4 patch   Medications administered: We administered capsaicin topical system.  See the medical record for exact dosing, route, and time of administration.  Follow-up plan:   Return for Keep sch. appt.       Recent Visits Date Type Provider Dept  03/11/23 Office Visit Edward Jolly, MD [facility]  Showing recent visits within past 90 days and meeting all other requirements Today's Visits Date Type Provider Dept  04/02/23 Procedure visit Edward Jolly, MD [facility]  Showing today's visits and meeting all other requirements Future Appointments Date Type Provider Dept  06/17/23 Appointment Edward Jolly, MD [facility]  Showing future appointments within next 90 days and meeting all other requirements  Disposition: Discharge home  Discharge (Date  Time): 04/02/2023; 1048 hrs.   Primary Care Physician: Erasmo Downer, MD Location: [facility] Note by: Edward Jolly, MD (TTS technology used. I apologize for any typographical errors that were not detected and corrected.) Date:  04/02/2023; Time: 12:17 PM  Disclaimer:  Medicine is not an Visual merchandiser. The only guarantee in medicine is that nothing is guaranteed. It is important to note that the decision to proceed with this intervention was based on the information collected from the patient. The Data and conclusions were drawn from the patient's questionnaire, the interview, and the physical examination. Because the information was provided in large part by the patient, it cannot be guaranteed that it has not been purposely or unconsciously manipulated. Every effort has been made to obtain as much relevant data as possible for this evaluation. It is important to note that the conclusions that lead  to this procedure are derived in large part from the available data. Always take into account that the treatment will also be dependent on availability of resources and existing treatment guidelines, considered by other Pain Management Practitioners as being common knowledge and practice, at the time of the intervention. For Medico-Legal purposes, it is also important to point out that variation in procedural techniques and pharmacological choices are the acceptable norm. The indications, contraindications, technique, and results of the above procedure should only be interpreted and judged by a Board-Certified Interventional Pain Specialist with extensive familiarity and expertise in the same exact procedure and technique.

## 2023-04-02 NOTE — Progress Notes (Signed)
Safety precautions to be maintained throughout the outpatient stay will include: orient to surroundings, keep bed in low position, maintain call bell within reach at all times, provide assistance with transfer out of bed and ambulation.  

## 2023-04-03 ENCOUNTER — Ambulatory Visit
Admission: RE | Admit: 2023-04-03 | Discharge: 2023-04-03 | Disposition: A | Discharge status: Home / Self Care | Payer: Medicaid Other | Source: Ambulatory Visit | Attending: Physician Assistant | Admitting: Physician Assistant

## 2023-04-03 ENCOUNTER — Other Ambulatory Visit (HOSPITAL_COMMUNITY): Payer: Self-pay | Admitting: Physician Assistant

## 2023-04-03 ENCOUNTER — Telehealth: Payer: Self-pay

## 2023-04-03 ENCOUNTER — Ambulatory Visit (HOSPITAL_COMMUNITY): Admission: RE | Admit: 2023-04-03 | Payer: Medicaid Other | Source: Ambulatory Visit

## 2023-04-03 DIAGNOSIS — R2240 Localized swelling, mass and lump, unspecified lower limb: Secondary | ICD-10-CM

## 2023-04-03 NOTE — Telephone Encounter (Signed)
Called PP. No answer. Left message to call if needed. 

## 2023-04-04 ENCOUNTER — Ambulatory Visit: Payer: Medicaid Other

## 2023-04-07 NOTE — Progress Notes (Unsigned)
04/08/23 11:42 AM   Jane [name] 04-Apr-1966 [id number]  Referring provider:  Erasmo Downer, MD [address] [place],  [place] [address] Chief Complaint  Patient presents with   Nephrolithiasis   Urological history 1. OAB -Contributing factors of age, vaginal atrophy, obesity, anxiety, constipation, diabetes, back pain, hypertension, pelvic surgery and diuretics. -cysto (09/2021) - NED  -Gemtesa 75 mg daily  HPI: Jane [name] is a 57 y.o.female  with a personal history of chronic kidney disease stage III, acute cystitis and OAB , who presents today for CT scan results and symptom recheck.    At her visit on 02/28/2023, she is having 1-7 daytime urinations, 3 or more episodes of nocturia with a strong urge to urinate.  She is having incontinence with both urge and stress.  She is leaking 3 or more times daily.  She is going through 5 or more absorbent pads daily.  She does engage in toilet mapping.  Patient denies any modifying or aggravating factors.  Patient denies any gross hematuria, dysuria or suprapubic/flank pain.  Patient denies any fevers, chills, nausea or vomiting.  UA yellow clear, specific gravity 1.015, trace blood, pH 6.5, 0-5 WBCs, 3-10 RBCs, 0-10 epithelial cells and moderate bacteria.  PVR 15 mL.  Her urine culture was negative.    At her visit on 03/20/2023, she states she was seen by an urgent care and diagnosed with a UTI and placed on cephalexin which she was taking twice daily and then she received a call back and was changed to Augmentin twice daily for 7 days.  She states her symptoms did not abate.  She was seen by her PCP for symptoms of frequency, urgency, dysuria, urge incontinence, right-sided flank pain and suprapubic pain.  She was diagnosed with a UTI.  Her culture grew out pansensitive E. coli.  She was initially placed Septra DS for 7 days and her symptoms have not abated.  She is having 8 or more daytime urinations, with nocturia x  3, strong urge to urinate, she has urge incontinence.  She is having urinary leakage 1-2 times daily.  She is going through 6 absorbent pads a day.  She does engage in restricting fluids.  She does engage in toilet mapping.  UA yellow clear, specific gravity 1.020, trace blood, pH 5.5, 0-5 WBCs, 0-2 RBCs, greater than 10 epithelial cells and moderate bacteria.  PVR 26 mL.  Urine culture was positive for MUF.  She was continued on Gemtesa 75 mg daily and underwent a CT scan for further evaluation.  Today, she is experiencing 8 or more daytime voids, 3 more episodes of nocturia with a strong urge to urinate.  She is having urge incontinence.  She is leaking 1-2 times daily.  She is going through 4-5 absorbent pads daily.  She engages in toilet mapping.  She is not restricting fluids.  She is compliant with her CPAP machine.  She is experiencing her urinary symptoms both during the day and nighttime.  She was at goal on [place], but it has seem to have worn off over the last several weeks.    She is having burning with urination and feels that is happening internally.  She does have a tube of vaginal estrogen cream somewhere at home and she has not been applying this.  Urinalysis is color yellow clear, specific gravity 1.020, 1+ blood, pH 5.5, trace leukocyte, 6-10 WBCs, 3-10 RBCs, greater than 10 epithelial cells, mucus threads are present and moderate bacteria.  She continues to have issues with right sided pain, but CT renal stone study did not demonstrate any hydronephrosis, renal masses or renal/ureteral/bladder calculi.  She has microscopic hematuria on UA, but she denies any gross hematuria.  She states that she has always had blood in her urine since she was a kid.  She has a history of CKD.    Patient denies any modifying or aggravating factors.  Patient denies any gross hematuria or suprapubic.  Patient denies any fevers, chills, nausea or vomiting.    PMH: Past Medical History:  Diagnosis  Date   ADD (attention deficit disorder)    Anxiety    Back pain    Bilateral swelling of feet    Bipolar 1 disorder (HCC)    Bipolar 1 disorder (HCC)    Bipolar disorder (HCC)    Chewing difficulty    Chronic fatigue syndrome    Chronic kidney disease    Stage 3 kidney disease;dx by Dr. Kari Baars.    Constipation    Depression    Diabetes mellitus without complication (HCC)    diet controlled   Dyspnea    with exertion   GERD (gastroesophageal reflux disease)    Headache    migraines   High cholesterol    History of blood clots    History of DVT (deep vein thrombosis)    left leg   Hypertension    states under control with meds., has been on med. x 2 years   Hypothyroidism    Joint pain    Neuropathy    Obsessive-compulsive disorder    Peripheral vascular disease (HCC)    Prediabetes    Respiratory failure requiring intubation (HCC)    Restless leg syndrome    Shortness of breath    Sleep apnea    Thrombocytopenia (HCC) 09/15/2019   Trigger thumb of left hand 01/2018   Trigger thumb of right hand     Surgical History: Past Surgical History:  Procedure Laterality Date   ABDOMINAL HYSTERECTOMY  06/2016   complete   APPLICATION OF WOUND VAC Left 04/27/2019   Procedure: APPLICATION OF WOUND VAC LEFT GROIN;  Surgeon: Maeola Harman, MD;  Location: [facility] OR;  Service: Vascular;  Laterality: Left;   AV FISTULA PLACEMENT Left 11/24/2018   Procedure: ARTERIOVENOUS (AV) FISTULA CREATION LEFT SFA TO LEFT FEMORAL VEIN;  Surgeon: Maeola Harman, MD;  Location: [facility] OR;  Service: Vascular;  Laterality: Left;   BACK SURGERY     CHOLECYSTECTOMY     COLONOSCOPY WITH PROPOFOL N/A 11/22/2019   Procedure: COLONOSCOPY WITH PROPOFOL;  Surgeon: Wyline Mood, MD;  Location: [facility] ENDOSCOPY;  Service: Gastroenterology;  Laterality: N/A;   COLONOSCOPY WITH PROPOFOL N/A 12/09/2022   Procedure: COLONOSCOPY WITH PROPOFOL;  Surgeon: Wyline Mood, MD;  Location: [facility] ENDOSCOPY;   Service: Gastroenterology;  Laterality: N/A;   FEMORAL ARTERY EXPLORATION  03/30/2019   Procedure: Left Common Femoral Artery and Vein Exploration;  Surgeon: Maeola Harman, MD;  Location: [facility] OR;  Service: Vascular;;   FEMORAL-FEMORAL BYPASS GRAFT Left 11/24/2018   Procedure: BYPASS GRAFT FEMORAL-FEMORAL VENOUS LEFT TO RIGHT PALMA PROCEDURE USING CRYOVEIN;  Surgeon: Maeola Harman, MD;  Location: [facility] OR;  Service: Vascular;  Laterality: Left;   GROIN DEBRIDEMENT Left 04/27/2019   Procedure: Drucie Ip DEBRIDEMENT;  Surgeon: Maeola Harman, MD;  Location: [facility] OR;  Service: Vascular;  Laterality: Left;   INSERTION OF ILIAC STENT  03/30/2019   Procedure: Stent of left common, external iliac veins  and left common femoral vein;  Surgeon: Maeola Harman, MD;  Location: [facility] OR;  Service: Vascular;;   KNEE ARTHROSCOPY WITH MENISCAL REPAIR Left 11/14/2020   Procedure: LEFT KNEE ARTHROSCOPY WITH PARTIAL MEDIAL MENISCECTOMY;  Surgeon: Vickki Hearing, MD;  Location: [facility] ORS;  Service: Orthopedics;  Laterality: Left;   LOWER EXTREMITY VENOGRAPHY N/A 08/17/2018   Procedure: LOWER EXTREMITY VENOGRAPHY - Central Venogram;  Surgeon: Maeola Harman, MD;  Location: [facility] INVASIVE CV LAB;  Service: Cardiovascular;  Laterality: N/A;   LOWER EXTREMITY VENOGRAPHY Bilateral 03/09/2019   Procedure: LOWER EXTREMITY VENOGRAPHY;  Surgeon: Maeola Harman, MD;  Location: [facility] INVASIVE CV LAB;  Service: Cardiovascular;  Laterality: Bilateral;   LOWER EXTREMITY VENOGRAPHY Left 08/16/2019   Procedure: LOWER EXTREMITY VENOGRAPHY;  Surgeon: Maeola Harman, MD;  Location: [facility] INVASIVE CV LAB;  Service: Cardiovascular;  Laterality: Left;   LOWER EXTREMITY VENOGRAPHY Left 03/25/2022   Procedure: LOWER EXTREMITY VENOGRAPHY;  Surgeon: Maeola Harman, MD;  Location: [facility] INVASIVE CV LAB;  Service: Cardiovascular;  Laterality: Left;  IVUS   LUMBAR FUSION  11/21/2000    L5-S1   LUMBAR SPINE SURGERY     x 2 others   PATCH ANGIOPLASTY Left 03/30/2019   Procedure: Patch Angioplasty of the Left Common Femoral Vein using Venosure Biologic patch;  Surgeon: Maeola Harman, MD;  Location: [facility] OR;  Service: Vascular;  Laterality: Left;   PERIPHERAL VASCULAR INTERVENTION Left 08/16/2019   Procedure: PERIPHERAL VASCULAR INTERVENTION;  Surgeon: Maeola Harman, MD;  Location: [facility] INVASIVE CV LAB;  Service: Cardiovascular;  Laterality: Left;  common femoral/femoral vein stent   PERIPHERAL VASCULAR INTERVENTION Left 03/25/2022   Procedure: PERIPHERAL VASCULAR INTERVENTION;  Surgeon: Maeola Harman, MD;  Location: [facility] INVASIVE CV LAB;  Service: Cardiovascular;  Laterality: Left;  COMMON FEMORAL VEIN   PERIPHERAL VASCULAR THROMBECTOMY Left 03/25/2022   Procedure: PERIPHERAL VASCULAR THROMBECTOMY;  Surgeon: Maeola Harman, MD;  Location: [facility] INVASIVE CV LAB;  Service: Cardiovascular;  Laterality: Left;  EXTREMITY/IVC   TRIGGER FINGER RELEASE Right 12/01/2017   Procedure: RELEASE TRIGGER FINGER/A-1 PULLEY RIGHT THUMB;  Surgeon: Betha Loa, MD;  Location: [facility];  Service: Orthopedics;  Laterality: Right;   TRIGGER FINGER RELEASE Left 01/26/2018   Procedure: LEFT TRIGGER THUMB RELEASE;  Surgeon: Betha Loa, MD;  Location: [facility];  Service: Orthopedics;  Laterality: Left;   ULTRASOUND GUIDANCE FOR VASCULAR ACCESS Right 03/30/2019   Procedure: Ultrasound-guided cannulation right internal jugular vein;  Surgeon: Maeola Harman, MD;  Location: [facility] OR;  Service: Vascular;  Laterality: Right;    Home Medications:  Allergies as of 04/08/2023       Reactions   Aripiprazole Other (See Comments)   BECOMES  VIOLENT   Seroquel [quetiapine Fumarate] Other (See Comments)   BECOMES VIOLENT   Chlorpromazine Other (See Comments)   SEVERE ANXIETY   Gabapentin Other (See Comments)   NIGHTMARES   Prednisone  Hives   Quetiapine         Medication List        Accurate as of April 08, 2023 11:42 AM. If you have any questions, ask your nurse or doctor.          STOP taking these medications    cephALEXin 500 MG capsule Commonly known as: KEFLEX Stopped by: Michiel Cowboy, PA-C       TAKE these medications    ALPRAZolam 1 MG tablet Commonly known as: XANAX Take 1 tablet (1 mg total)  by mouth 2 (two) times daily as needed. for anxiety   amphetamine-dextroamphetamine 30 MG tablet Commonly known as: ADDERALL Take 1 tablet by mouth 2 (two) times daily.   amphetamine-dextroamphetamine 30 MG tablet Commonly known as: ADDERALL Take 1 tablet by mouth 2 (two) times daily.   amphetamine-dextroamphetamine 30 MG tablet Commonly known as: Adderall Take 1 tablet by mouth 2 (two) times daily.   aspirin EC 81 MG tablet Take 81 mg by mouth daily.   atorvastatin 40 MG tablet Commonly known as: LIPITOR TAKE 1 TABLET BY MOUTH EVERY DAY   cetirizine 10 MG tablet Commonly known as: ZYRTEC TAKE 1 TABLET BY MOUTH DAILY   colchicine 0.6 MG tablet TAKE 2 TABLETS, TAKE 1 TABLET BY MOUTH 1 hour AFTER, THEN TAKE 1 TABLET EVERY DAY FOR 7 DAYS   estradiol 0.1 MG/GM vaginal cream Commonly known as: ESTRACE VAGINAL Apply 0.5mg  (pea-sized amount)  just inside the vaginal introitus with a finger-tip on Monday, Wednesday and Friday nights. Started by: Michiel Cowboy, PA-C   famotidine 20 MG tablet Commonly known as: PEPCID Take 20 mg by mouth daily.   FLUoxetine 40 MG capsule Commonly known as: PROZAC Take 1 capsule (40 mg total) by mouth 2 (two) times daily.   GARLIC PO Take 1 tablet by mouth daily.   Gemtesa 75 MG Tabs Generic drug: Vibegron Take 1 tablet (75 mg total) by mouth daily.   HYDROcodone-acetaminophen 10-325 MG tablet Commonly known as: NORCO Take 1 tablet by mouth every 6 (six) hours as needed. To last 30 days from fill date   HYDROcodone-acetaminophen 10-325 MG  tablet Commonly known as: NORCO Take 1 tablet by mouth every 6 (six) hours as needed. To last 30 days from fill date Start taking on: April 25, 2023   HYDROcodone-acetaminophen 10-325 MG tablet Commonly known as: NORCO Take 1 tablet by mouth every 6 (six) hours as needed. To last 30 days from fill date Start taking on: May 25, 2023   ketoconazole 2 % shampoo Commonly known as: NIZORAL Apply topically daily.   levothyroxine 125 MCG tablet Commonly known as: SYNTHROID Take 125 mcg by mouth daily before breakfast.   Linzess 145 MCG Caps capsule Generic drug: linaclotide TAKE ONE CAPSULE BY MOUTH DAILY   methocarbamol 750 MG tablet Commonly known as: ROBAXIN Take 1 tablet (750 mg total) by mouth 2 (two) times daily as needed for muscle spasms.   multivitamin capsule Take 1 capsule by mouth daily.   omega-3 acid ethyl esters 1 g capsule Commonly known as: LOVAZA Take by mouth.   Ozempic (1 MG/DOSE) 4 MG/3ML Sopn Generic drug: Semaglutide (1 MG/DOSE) Inject 1 mg as directed once a week.   pantoprazole 40 MG tablet Commonly known as: PROTONIX TAKE 1 TABLET BY MOUTH DAILY   polyethylene glycol powder 17 GM/SCOOP powder Commonly known as: GLYCOLAX/MIRALAX Take 17 g by mouth daily.   potassium chloride 10 MEQ tablet Commonly known as: KLOR-CON M TAKE 1 TABLET BY MOUTH THREE TIMES DAILY   pregabalin 100 MG capsule Commonly known as: LYRICA TAKE ONE CAPSULE BY MOUTH FOUR TIMES DAILY   rOPINIRole 2 MG tablet Commonly known as: REQUIP TAKE 1 TABLET BY MOUTH TWICE DAILY   traZODone 150 MG tablet Commonly known as: DESYREL Take 1 tablet (150 mg total) by mouth at bedtime.   trimethoprim 100 MG tablet Commonly known as: TRIMPEX Take 1 tablet (100 mg total) by mouth daily. Started by: Michiel Cowboy, PA-C   valACYclovir 1000 MG tablet Commonly known  as: VALTREX Take 2 tablets by mouth at first onset of fever blisters, then take 2 tablets by mouth 12 hours later.    warfarin 5 MG tablet Commonly known as: COUMADIN Take as directed by the anticoagulation clinic. If you are unsure how to take this medication, talk to your nurse or doctor. Original instructions: Take 1 tablet (5 mg total) by mouth daily. What changed: additional instructions        Allergies:  Allergies  Allergen Reactions   Aripiprazole Other (See Comments)    BECOMES  VIOLENT    Seroquel [Quetiapine Fumarate] Other (See Comments)    BECOMES VIOLENT   Chlorpromazine Other (See Comments)    SEVERE ANXIETY    Gabapentin Other (See Comments)    NIGHTMARES    Prednisone Hives   Quetiapine     Family History: Family History  Problem Relation Age of Onset   Heart disease Mother    Hyperlipidemia Mother    Hypertension Mother    Bipolar disorder Mother    Stroke Mother    Depression Mother    Sleep apnea Mother    Obesity Mother    Diabetes Father    Heart disease Father    Hyperlipidemia Father    Hypertension Father    Sleep apnea Father    Obesity Father    Drug abuse Daughter    ADD / ADHD Daughter    Drug abuse Daughter    Anxiety disorder Daughter    Bipolar disorder Daughter    Hypertension Sister    Hypertension Brother    Hyperlipidemia Brother    Heart disease Brother    Bipolar disorder Maternal Aunt    Suicidality Maternal Aunt     Social History:  reports that she has never smoked. She has never used smokeless tobacco. She reports that she does not drink alcohol and does not use drugs.   Physical Exam: BP 119/73   Pulse 80   Ht 5\' 10"  (1.778 m)   Wt 223 lb (101.2 kg)   LMP  (LMP Unknown)   BMI 32.00 kg/m   Constitutional:  Well nourished. Alert and oriented, No acute distress. HEENT: [place] AT, moist mucus membranes.  Trachea midline Cardiovascular: No clubbing, cyanosis, or edema. Respiratory: Normal respiratory effort, no increased work of breathing. Neurologic: Grossly intact, no focal deficits, moving all 4 extremities. Psychiatric:  Normal mood and affect.    Laboratory Data: Urinalysis  See EPIC and HPI I have reviewed the labs.   Pertinent imaging: CLINICAL DATA:  Chronic kidney disease, acute cystitis, 6 week follow-up.   EXAM: CT ABDOMEN AND PELVIS WITHOUT CONTRAST   TECHNIQUE: Multidetector CT imaging of the abdomen and pelvis was performed following the standard protocol without IV contrast.   RADIATION DOSE REDUCTION: This exam was performed according to the departmental dose-optimization program which includes automated exposure control, adjustment of the mA and/or kV according to patient size and/or use of iterative reconstruction technique.   COMPARISON:  Multiple priors including CT June 10, 2022   FINDINGS: Lower chest: Bibasilar atelectasis/scarring.   Hepatobiliary: Unremarkable noncontrast enhanced appearance of the hepatic parenchyma. Gallbladder surgically absent. Similar prominence of the extrahepatic biliary tree with the common duct measuring 13 mm, favored reservoir effect post cholecystectomy.   Pancreas: No pancreatic ductal dilation or evidence of acute inflammation.   Spleen: No splenomegaly.   Adrenals/Urinary Tract: Bilateral adrenal glands appear normal. No hydronephrosis. No contour deforming renal mass. No renal, ureteral or bladder calculi. Urinary bladder is  unremarkable for degree of distension.   Stomach/Bowel: Stomach is unremarkable for degree of distension. No pathologic dilation of small or large bowel. Surgical clips/suture along the cecum likely reflects prior appendectomy. No evidence of acute bowel inflammation.   Vascular/Lymphatic: Aortic atherosclerosis. Normal caliber abdominal aorta. Smooth IVC contours. Vascular stent extends from the left common iliac through the left common femoral vein. Collateral vessels in the anterior pelvic wall. Stable mildly enlarged/prominent left iliac side chain, pelvic sidewall and inguinal lymph nodes for instance  a left pelvic sidewall lymph node measuring 10 mm in short axis on image 79/2 previously 11 mm.   Reproductive: Status post hysterectomy. No adnexal masses.   Other: Postsurgical change in the left inguinal canal.   Musculoskeletal: Interbody fixation hardware at L5-S1. Multilevel degenerative changes spine with marked disc space narrowing at L4-L5 with vacuum disc artifact and encroachment of the left neural foramina at this level.   IMPRESSION: 1. No acute findings in the abdomen or pelvis. 2. No hydronephrosis. No renal, ureteral or bladder calculi. 3. Stable mildly enlarged/prominent left iliac side chain, pelvic sidewall and inguinal lymph nodes, likely reactive. 4. Vascular stent extends from the left common iliac through the left common femoral vein. Collateral vessels in the anterior pelvic wall. 5.  Aortic Atherosclerosis (ICD10-I70.0).     Electronically Signed   By: Maudry Mayhew M.D.   On: 03/29/2023 13:09 I have independently reviewed the films.  See HPI.      Assessment & Plan:    Overactive bladder, urgency/frequency  - continue Gemtesa 75 mg daily - symptoms worsening in spite of OAB agent - will have her see Dr. Sherron Monday for his opinion  2. Microscopic hematuria -UA w/ micro heme on today's UA  -she states this has been consistent since she was a child -recent non -contrast CT did not have any worrisome GU findings -Cystoscopy 2 years ago just noted trigonitis -Will continue to monitor and if she develops gross hematuria, continues to be symptomatic  or a worsening in her microscopic hematuria,  we will need to repeat cystoscopy and perform CT urogram  3. rUTI's vs GSM -She has been on 3 different antibiotics in the last month with no resolution of her symptoms -UA with pyuria, hematuria and pyuria  -Urine sent for culture -restarted on vaginal estrogen cream  -Recent noncontrast CT did not demonstrate an nidus for infection -cysto (2022) -  trigonitis  -Started on trimethoprim 100 mg daily for down-regulation of the bladder for 90 days   4. Right flank pain -No urological findings to explain right-sided flank pain at this time  Return for appointment with Dr. Sherron Monday for worsening OAB and incontinence .   [facility] [address] [place], [place] [address] [phone]

## 2023-04-08 ENCOUNTER — Encounter: Payer: Self-pay | Admitting: Urology

## 2023-04-08 ENCOUNTER — Ambulatory Visit (INDEPENDENT_AMBULATORY_CARE_PROVIDER_SITE_OTHER): Payer: Medicaid Other | Admitting: Urology

## 2023-04-08 VITALS — BP 119/73 | HR 80 | Ht 70.0 in | Wt 223.0 lb

## 2023-04-08 DIAGNOSIS — N390 Urinary tract infection, site not specified: Secondary | ICD-10-CM

## 2023-04-08 DIAGNOSIS — N183 Chronic kidney disease, stage 3 unspecified: Secondary | ICD-10-CM

## 2023-04-08 DIAGNOSIS — N3281 Overactive bladder: Secondary | ICD-10-CM | POA: Diagnosis not present

## 2023-04-08 DIAGNOSIS — N952 Postmenopausal atrophic vaginitis: Secondary | ICD-10-CM

## 2023-04-08 DIAGNOSIS — N3941 Urge incontinence: Secondary | ICD-10-CM

## 2023-04-08 DIAGNOSIS — Z87440 Personal history of urinary (tract) infections: Secondary | ICD-10-CM

## 2023-04-08 DIAGNOSIS — R39198 Other difficulties with micturition: Secondary | ICD-10-CM

## 2023-04-08 DIAGNOSIS — R109 Unspecified abdominal pain: Secondary | ICD-10-CM | POA: Diagnosis not present

## 2023-04-08 DIAGNOSIS — R351 Nocturia: Secondary | ICD-10-CM

## 2023-04-08 DIAGNOSIS — R3129 Other microscopic hematuria: Secondary | ICD-10-CM | POA: Diagnosis not present

## 2023-04-08 DIAGNOSIS — R8281 Pyuria: Secondary | ICD-10-CM | POA: Diagnosis not present

## 2023-04-08 LAB — URINALYSIS, COMPLETE
Bilirubin, UA: NEGATIVE
Glucose, UA: NEGATIVE
Ketones, UA: NEGATIVE
Nitrite, UA: NEGATIVE
Protein,UA: NEGATIVE
Specific Gravity, UA: 1.02 (ref 1.005–1.030)
Urobilinogen, Ur: 0.2 mg/dL (ref 0.2–1.0)
pH, UA: 5.5 (ref 5.0–7.5)

## 2023-04-08 LAB — MICROSCOPIC EXAMINATION: Epithelial Cells (non renal): >10 /hpf — AB (ref 0–10)

## 2023-04-08 MED ORDER — TRIMETHOPRIM 100 MG PO TABS
100.0000 mg | ORAL_TABLET | Freq: Every day | ORAL | 0 refills | Status: DC
Start: 2023-04-08 — End: 2023-06-17

## 2023-04-08 MED ORDER — ESTRADIOL 0.1 MG/GM VA CREA
TOPICAL_CREAM | VAGINAL | 12 refills | Status: DC
Start: 2023-04-08 — End: 2024-03-30

## 2023-04-08 NOTE — Patient Instructions (Signed)
You have been given a sample of Premarin.  There is an applicator inside the box and you may throw this away as you do not need the applicator to apply the medication.  You will apply a "pea-sized" amount of the Premarin cream to your urethra on Monday, Wednesday and Friday evenings.

## 2023-04-09 ENCOUNTER — Ambulatory Visit: Payer: Medicaid Other

## 2023-04-11 LAB — CULTURE, URINE COMPREHENSIVE

## 2023-04-14 ENCOUNTER — Ambulatory Visit (INDEPENDENT_AMBULATORY_CARE_PROVIDER_SITE_OTHER): Payer: Medicaid Other

## 2023-04-14 ENCOUNTER — Telehealth: Payer: Self-pay | Admitting: Family Medicine

## 2023-04-14 ENCOUNTER — Ambulatory Visit: Payer: Medicaid Other | Admitting: Podiatry

## 2023-04-14 DIAGNOSIS — M2042 Other hammer toe(s) (acquired), left foot: Secondary | ICD-10-CM

## 2023-04-14 DIAGNOSIS — I739 Peripheral vascular disease, unspecified: Secondary | ICD-10-CM | POA: Diagnosis not present

## 2023-04-14 MED ORDER — SULFAMETHOXAZOLE-TRIMETHOPRIM 800-160 MG PO TABS: 1.0000 | ORAL_TABLET | Freq: Two times a day (BID) | ORAL | 0 refills | Status: DC

## 2023-04-14 NOTE — Progress Notes (Signed)
  Subjective:  Patient ID: Jane [name], female    DOB: 12-16-1965,  MRN: [id number]  Chief Complaint  Patient presents with   Hammer Toe    - left foot pain in the 2nd toe & foot    57 y.o. female presents with the above complaint. History confirmed with patient.  She has numbness in the left foot especially in the toes can be painful and the nails in the tips of the toes.  Feet always hurt all over physical cramping in her arches.  Also has swelling and tenderness at the insertion of her Achilles.  She previously has had ingrown toenails does not think that they were properly resolved.  Never evaluated for gout.  She has an upcoming venogram with Dr. Randie Heinz.  She does have a history of spinal pathology as well  Interval history: Still feels about the same, the toes are very tender as well  Objective:  Physical Exam: warm, good capillary refill, no trophic changes or ulcerative lesions, normal DP and PT pulses, and abnormal sensory exam with loss of protective sensation in the toes and pain at the insertion of the Achilles.  Semireducible hammertoe deformities  Assessment:   1. Hammertoe of second toe of left foot   2. PVD (peripheral vascular disease) (HCC)      Plan:  Patient was evaluated and treated and all questions answered.  Discussed with her that she should continue therapy.  She went to 1 session at [facility] and did not follow-up further with them.  Most of the studies expect is chronic pain and there is likely no full or reasonable solution to this at this point.  Dispensed her silicone toe crest to offload the tips of the toes which are tender for her.  She will return as needed.  I do not know if there is much else that I can offer her for further treatment at this point  Return for after circulation testing.

## 2023-04-14 NOTE — Telephone Encounter (Signed)
-----   Message from Harle Battiest, PA-C sent at 04/13/2023  6:58 PM EDT ----- Please let Jane [name] know that her urine culture was positive for infection and she needs to stop the trimethoprim and start Septra DS twice daily for seven days and then resume the trimethoprim.

## 2023-04-14 NOTE — Telephone Encounter (Signed)
[facility] informed patient of instructions. ABX sent to pharmacy.

## 2023-04-16 ENCOUNTER — Ambulatory Visit (INDEPENDENT_AMBULATORY_CARE_PROVIDER_SITE_OTHER): Payer: Medicaid Other

## 2023-04-16 DIAGNOSIS — Z86718 Personal history of other venous thrombosis and embolism: Secondary | ICD-10-CM | POA: Diagnosis not present

## 2023-04-16 LAB — POCT INR
INR: 2.4 (ref 2.0–3.0)
POC INR: 2.4
PT: 28.6

## 2023-04-16 NOTE — Patient Instructions (Signed)
Description   5 mg daily F/U in 4 weeks     

## 2023-04-21 ENCOUNTER — Ambulatory Visit (HOSPITAL_COMMUNITY)
Admission: RE | Admit: 2023-04-21 | Discharge: 2023-04-21 | Disposition: A | Discharge status: Home / Self Care | Payer: Medicaid Other | Source: Ambulatory Visit | Attending: Podiatry | Admitting: Podiatry

## 2023-04-21 DIAGNOSIS — M2042 Other hammer toe(s) (acquired), left foot: Secondary | ICD-10-CM | POA: Diagnosis present

## 2023-04-21 LAB — VAS US ABI WITH/WO TBI
Left ABI: 0.79
Right ABI: 1.19

## 2023-04-28 ENCOUNTER — Telehealth: Payer: Self-pay | Admitting: Urology

## 2023-04-28 ENCOUNTER — Encounter (INDEPENDENT_AMBULATORY_CARE_PROVIDER_SITE_OTHER): Payer: Self-pay | Admitting: Family Medicine

## 2023-04-28 ENCOUNTER — Ambulatory Visit (INDEPENDENT_AMBULATORY_CARE_PROVIDER_SITE_OTHER): Payer: Medicaid Other | Admitting: Family Medicine

## 2023-04-28 VITALS — BP 114/70 | HR 59 | Temp 97.6°F | Ht 70.0 in | Wt 224.0 lb

## 2023-04-28 DIAGNOSIS — E669 Obesity, unspecified: Secondary | ICD-10-CM | POA: Diagnosis not present

## 2023-04-28 DIAGNOSIS — Z6832 Body mass index (BMI) 32.0-32.9, adult: Secondary | ICD-10-CM

## 2023-04-28 DIAGNOSIS — E1165 Type 2 diabetes mellitus with hyperglycemia: Secondary | ICD-10-CM

## 2023-04-28 DIAGNOSIS — E559 Vitamin D deficiency, unspecified: Secondary | ICD-10-CM | POA: Diagnosis not present

## 2023-04-28 DIAGNOSIS — Z7985 Long-term (current) use of injectable non-insulin antidiabetic drugs: Secondary | ICD-10-CM

## 2023-04-28 NOTE — Progress Notes (Unsigned)
Chief Complaint:   OBESITY Jane [name] is here to discuss her progress with her obesity treatment plan along with follow-up of her obesity related diagnoses. Jane [name] is on the Category 3 Plan and states she is following her eating plan approximately 25% of the time. Jane [name] states she is doing 0 minutes 0 times per week.  Today's visit was #: 39 Starting weight: 283 lbs Starting date: 03/29/2020 Today's weight: 224 lbs Today's date: 04/28/2023 Total lbs lost to date: 59 Total lbs lost since last in-office visit: 0  Interim History: The past six weeks has been more difficult.  Her friend that she was going to the gym with got a boyfriend so no longer brings her to the gym.  She also had fluid on the knee that was evaluated in ED and then with her PCP.  She has been more stressed out; she is dealing with more familial stressors with her daughter and grandson.  Subjective:   1. Vitamin D deficiency Patient's last vitamin D level was of 48.8.  She is not on prescription vitamin D.  2. Type 2 diabetes mellitus with hyperglycemia, without long-term current use of insulin (HCC) Patient is on Ozempic 1 mg weekly.  Last [name] was 5.9.  Assessment/Plan:   1. Vitamin D deficiency Patient was encouraged to take OTC vitamin D 5000 IU daily.  2. Type 2 diabetes mellitus with hyperglycemia, without long-term current use of insulin (HCC) We will repeat labs at patient's next appointment.  She will continue her current medications at same dose.  3. BMI 32.0-32.9,adult  4. Obesity with starting BMI of 43.0 Jane [name] is currently in the action stage of change. As such, her goal is to continue with weight loss efforts. She has agreed to the Category 3 Plan.   Exercise goals: All adults should avoid inactivity. Some physical activity is better than none, and adults who participate in any amount of physical activity gain some health benefits.  Behavioral modification strategies: increasing lean protein intake,  meal planning and cooking strategies, keeping healthy foods in the home, and planning for success.  Jane [name] has agreed to follow-up with our clinic in 4 weeks. She was informed of the importance of frequent follow-up visits to maximize her success with intensive lifestyle modifications for her multiple health conditions.   Objective:   Blood pressure 114/70, pulse (!) 59, temperature 97.6 F (36.4 C), height 5\' 10"  (1.778 m), weight 224 lb (101.6 kg), SpO2 98 %. Body mass index is 32.14 kg/m.  General: Cooperative, alert, well developed, in no acute distress. HEENT: Conjunctivae and lids unremarkable. Cardiovascular: Regular rhythm.  Lungs: Normal work of breathing. Neurologic: No focal deficits.   Lab Results  Component Value Date   CREATININE 1.38 (H) 01/30/2023   BUN 34 (H) 01/30/2023   NA 137 01/30/2023   K 3.5 01/30/2023   CL 99 01/30/2023   CO2 27 01/30/2023   Lab Results  Component Value Date   ALT 16 11/07/2022   AST 12 11/07/2022   ALKPHOS 102 11/07/2022   BILITOT 0.3 11/07/2022   Lab Results  Component Value Date   HGBA1C 5.9 (H) 11/07/2022   HGBA1C 5.8 (A) 04/22/2022   HGBA1C 6.2 (H) 11/06/2021   HGBA1C 6.3 (H) 03/22/2021   HGBA1C 5.8 (A) 10/24/2020   Lab Results  Component Value Date   INSULIN 21.5 03/29/2020   Lab Results  Component Value Date   TSH 1.440 11/07/2022   Lab Results  Component Value Date  CHOL 187 11/07/2022   HDL 33 (L) 11/07/2022   LDLCALC 105 (H) 11/07/2022   TRIG 285 (H) 11/07/2022   CHOLHDL 5.7 (H) 11/07/2022   Lab Results  Component Value Date   VD25OH 48.8 03/14/2023   VD25OH 47.10 02/21/2022   VD25OH 50.60 01/01/2021   Lab Results  Component Value Date   WBC 4.8 02/24/2023   HGB 13.5 02/24/2023   HCT 40.8 02/24/2023   MCV 94.9 02/24/2023   PLT 152 02/24/2023   Lab Results  Component Value Date   IRON 93 02/24/2023   TIBC 322 02/24/2023   FERRITIN 214 02/24/2023   Attestation Statements:   Reviewed by  clinician on day of visit: allergies, medications, problem list, medical history, surgical history, family history, social history, and previous encounter notes.   I, Burt Knack, am acting as transcriptionist for Reuben Likes, MD.  I have reviewed the above documentation for accuracy and completeness, and I agree with the above. - Reuben Likes, MD

## 2023-04-28 NOTE — Telephone Encounter (Signed)
Patient called regarding Trimethoprim 100 mg that she was supposed to take for 90 days. She said that pharmacy told her they needed prior authorization to fill medication. Pharmacy is [facility] in [place].

## 2023-04-29 NOTE — Telephone Encounter (Signed)
I called [facility] to have a form faxed to start the prior authorization. I received the fax and attempted to start PA on Cover My Meds and was unsuccessful. A phone number was given and I tried calling several time to initiate a PA and it hangs up before I get to talk to a person. I spoke to patient and relayed the information. She voiced understanding. I have informed Geraldine Contras in hopes she can get it approved.

## 2023-05-05 ENCOUNTER — Encounter: Payer: Self-pay | Admitting: Hematology

## 2023-05-09 ENCOUNTER — Encounter: Payer: Self-pay | Admitting: Family Medicine

## 2023-05-09 ENCOUNTER — Ambulatory Visit (INDEPENDENT_AMBULATORY_CARE_PROVIDER_SITE_OTHER): Payer: MEDICAID | Admitting: Family Medicine

## 2023-05-09 VITALS — BP 114/57 | HR 58 | Temp 98.1°F | Resp 12 | Ht 70.0 in | Wt 228.7 lb

## 2023-05-09 DIAGNOSIS — E038 Other specified hypothyroidism: Secondary | ICD-10-CM | POA: Diagnosis not present

## 2023-05-09 DIAGNOSIS — K5909 Other constipation: Secondary | ICD-10-CM

## 2023-05-09 DIAGNOSIS — E785 Hyperlipidemia, unspecified: Secondary | ICD-10-CM

## 2023-05-09 DIAGNOSIS — E1169 Type 2 diabetes mellitus with other specified complication: Secondary | ICD-10-CM

## 2023-05-09 DIAGNOSIS — E1159 Type 2 diabetes mellitus with other circulatory complications: Secondary | ICD-10-CM | POA: Diagnosis not present

## 2023-05-09 DIAGNOSIS — E1122 Type 2 diabetes mellitus with diabetic chronic kidney disease: Secondary | ICD-10-CM | POA: Diagnosis not present

## 2023-05-09 DIAGNOSIS — E1142 Type 2 diabetes mellitus with diabetic polyneuropathy: Secondary | ICD-10-CM

## 2023-05-09 DIAGNOSIS — N1831 Chronic kidney disease, stage 3a: Secondary | ICD-10-CM

## 2023-05-09 DIAGNOSIS — M792 Neuralgia and neuritis, unspecified: Secondary | ICD-10-CM

## 2023-05-09 DIAGNOSIS — I152 Hypertension secondary to endocrine disorders: Secondary | ICD-10-CM

## 2023-05-09 DIAGNOSIS — Z7901 Long term (current) use of anticoagulants: Secondary | ICD-10-CM

## 2023-05-09 DIAGNOSIS — G43709 Chronic migraine without aura, not intractable, without status migrainosus: Secondary | ICD-10-CM

## 2023-05-09 MED ORDER — ATORVASTATIN CALCIUM 40 MG PO TABS: 40.0000 mg | ORAL_TABLET | Freq: Every day | ORAL | 3 refills | Status: DC

## 2023-05-09 MED ORDER — CETIRIZINE HCL 10 MG PO TABS: 10.0000 mg | ORAL_TABLET | Freq: Every day | ORAL | 3 refills | Status: DC

## 2023-05-09 MED ORDER — WARFARIN SODIUM 5 MG PO TABS: 5.0000 mg | ORAL_TABLET | Freq: Every day | ORAL | 3 refills | Status: DC

## 2023-05-09 MED ORDER — POLYETHYLENE GLYCOL 3350 17 GM/SCOOP PO POWD: 17.0000 g | Freq: Every day | ORAL | 1 refills | Status: DC

## 2023-05-09 MED ORDER — POTASSIUM CHLORIDE CRYS ER 10 MEQ PO TBCR: 10.0000 meq | EXTENDED_RELEASE_TABLET | Freq: Three times a day (TID) | ORAL | 3 refills | Status: DC

## 2023-05-09 MED ORDER — LINACLOTIDE 145 MCG PO CAPS: 145.0000 ug | ORAL_CAPSULE | Freq: Every day | ORAL | 3 refills | Status: DC

## 2023-05-09 MED ORDER — PANTOPRAZOLE SODIUM 40 MG PO TBEC: 40.0000 mg | DELAYED_RELEASE_TABLET | Freq: Every day | ORAL | 3 refills | Status: DC

## 2023-05-09 MED ORDER — LEVOTHYROXINE SODIUM 125 MCG PO TABS: 125.0000 ug | ORAL_TABLET | Freq: Every day | ORAL | 3 refills | Status: DC

## 2023-05-09 MED ORDER — ROPINIROLE HCL 2 MG PO TABS: 2.0000 mg | ORAL_TABLET | Freq: Two times a day (BID) | ORAL | 1 refills | Status: DC

## 2023-05-09 MED ORDER — PREGABALIN 100 MG PO CAPS: 100.0000 mg | ORAL_CAPSULE | Freq: Four times a day (QID) | ORAL | 5 refills | Status: DC

## 2023-05-09 NOTE — Progress Notes (Signed)
Established patient visit   Patient: Jane [name]   DOB: [birth year]   57 y.o. Female  MRN: [id number] Visit Date: 05/09/2023  Today's healthcare provider: Shirlee Latch, MD   Chief Complaint  Patient presents with   Medical Management of Chronic Issues   Subjective    HPI  Diabetes Mellitus Type II, follow-up  Lab Results  Component Value Date   HGBA1C 5.9 (H) 11/07/2022   HGBA1C 5.8 (A) 04/22/2022   HGBA1C 6.2 (H) 11/06/2021   Last seen for diabetes 6 months ago.  Management since then includes continuing the same treatment. She reports excellent compliance with treatment. She is not having side effects.   Home blood sugar records:  not being checked  Episodes of hypoglycemia? No    Current insulin regiment: none Most Recent Eye Exam: [place]   --------------------------------------------------------------------------------------------------- Hypertension, follow-up  BP Readings from Last 3 Encounters:  05/09/23 (!) 114/57  04/28/23 114/70  04/08/23 119/73   Wt Readings from Last 3 Encounters:  05/09/23 228 lb 11.2 oz (103.7 kg)  04/28/23 224 lb (101.6 kg)  04/08/23 223 lb (101.2 kg)     She was last seen for hypertension 6 months ago.  BP at that visit was 115/65. Management since that visit includes no changes. She reports excellent compliance with treatment. She is not having side effects.    Outside blood pressures are stable.  --------------------------------------------------------------------------------------------------- Lipid/Cholesterol, follow-up  Last Lipid Panel: Lab Results  Component Value Date   CHOL 187 11/07/2022   LDLCALC 105 (H) 11/07/2022   HDL 33 (L) 11/07/2022   TRIG 285 (H) 11/07/2022    She was last seen for this 6 months ago.  Management since that visit includes no changes.  She reports excellent compliance with treatment. She is not having side effects.   Last metabolic panel Lab Results  Component  Value Date   GLUCOSE 113 (H) 01/30/2023   NA 137 01/30/2023   K 3.5 01/30/2023   BUN 34 (H) 01/30/2023   CREATININE 1.38 (H) 01/30/2023   EGFR 44 (L) 11/07/2022   GFRNONAA 45 (L) 01/30/2023   CALCIUM 9.4 01/30/2023   AST 12 11/07/2022   ALT 16 11/07/2022   The 10-year ASCVD risk score (Arnett DK, et al., 2019) is: 5%  ---------------------------------------------------------------------------------------------------  Discussed the use of AI scribe software for clinical note transcription with the patient, who gave verbal consent to proceed.  History of Present Illness   The patient, with a history of bipolar disorder, diabetic peripheral neuropathy, chronic low back pain with sciatica, degenerative disc disease, May-Thurner syndrome, depression, and history of multiple back surgeries, presents with upper back pain that feels like a bruise, but there is no visible rash or bruising. The pain has been present for a while and is getting worse. The patient also reports frequent headaches, particularly on the left side of the head, which have been constant for the past two weeks. The headaches are accompanied by scalp soreness and blurry vision, and she prefers to close her eyes rather than look at anything. The patient has been taking Excedrin for the headaches, which provides temporary relief but the pain returns and is often worse than before. The patient also mentions needing a form (FL2) for [employer] social service filled out, similar to the one filled out last year. The patient lives at home and does not require assistance with bathing and dressing. The patient uses a cane for ambulation occasionally, depending on the condition of their knee.  The patient also mentions having a UTI that she can't get rid of.       Medications: Outpatient Medications Prior to Visit  Medication Sig   ALPRAZolam (XANAX) 1 MG tablet Take 1 tablet (1 mg total) by mouth 2 (two) times daily as needed. for anxiety    amphetamine-dextroamphetamine (ADDERALL) 30 MG tablet Take 1 tablet by mouth 2 (two) times daily.   amphetamine-dextroamphetamine (ADDERALL) 30 MG tablet Take 1 tablet by mouth 2 (two) times daily.   amphetamine-dextroamphetamine (ADDERALL) 30 MG tablet Take 1 tablet by mouth 2 (two) times daily.   aspirin EC 81 MG tablet Take 81 mg by mouth daily.   colchicine 0.6 MG tablet TAKE 2 TABLETS, TAKE 1 TABLET BY MOUTH 1 hour AFTER, THEN TAKE 1 TABLET EVERY DAY FOR 7 DAYS   estradiol (ESTRACE VAGINAL) 0.1 MG/GM vaginal cream Apply 0.5mg  (pea-sized amount)  just inside the vaginal introitus with a finger-tip on Monday, Wednesday and Friday nights.   famotidine (PEPCID) 20 MG tablet Take 20 mg by mouth daily.   FLUoxetine (PROZAC) 40 MG capsule Take 1 capsule (40 mg total) by mouth 2 (two) times daily.   GARLIC PO Take 1 tablet by mouth daily.   HYDROcodone-acetaminophen (NORCO) 10-325 MG tablet Take 1 tablet by mouth every 6 (six) hours as needed. To last 30 days from fill date   [START ON 05/25/2023] HYDROcodone-acetaminophen (NORCO) 10-325 MG tablet Take 1 tablet by mouth every 6 (six) hours as needed. To last 30 days from fill date   ketoconazole (NIZORAL) 2 % shampoo Apply topically daily.   methocarbamol (ROBAXIN) 750 MG tablet Take 1 tablet (750 mg total) by mouth 2 (two) times daily as needed for muscle spasms.   Multiple Vitamin (MULTIVITAMIN) capsule Take 1 capsule by mouth daily.   omega-3 acid ethyl esters (LOVAZA) 1 g capsule Take by mouth.   Semaglutide, 1 MG/DOSE, 4 MG/3ML SOPN Inject 1 mg as directed once a week.   sulfamethoxazole-trimethoprim (BACTRIM DS) 800-160 MG tablet Take 1 tablet by mouth every 12 (twelve) hours.   traZODone (DESYREL) 150 MG tablet Take 1 tablet (150 mg total) by mouth at bedtime.   trimethoprim (TRIMPEX) 100 MG tablet Take 1 tablet (100 mg total) by mouth daily.   valACYclovir (VALTREX) 1000 MG tablet Take 2 tablets by mouth at first onset of fever blisters, then  take 2 tablets by mouth 12 hours later.   Vibegron (GEMTESA) 75 MG TABS Take 1 tablet (75 mg total) by mouth daily.   [DISCONTINUED] atorvastatin (LIPITOR) 40 MG tablet TAKE 1 TABLET BY MOUTH EVERY DAY   [DISCONTINUED] cetirizine (ZYRTEC) 10 MG tablet TAKE 1 TABLET BY MOUTH DAILY   [DISCONTINUED] levothyroxine (SYNTHROID) 125 MCG tablet Take 125 mcg by mouth daily before breakfast.   [DISCONTINUED] LINZESS 145 MCG CAPS capsule TAKE ONE CAPSULE BY MOUTH DAILY   [DISCONTINUED] pantoprazole (PROTONIX) 40 MG tablet TAKE 1 TABLET BY MOUTH DAILY   [DISCONTINUED] polyethylene glycol powder (GLYCOLAX/MIRALAX) 17 GM/SCOOP powder Take 17 g by mouth daily.   [DISCONTINUED] potassium chloride (KLOR-CON M) 10 MEQ tablet TAKE 1 TABLET BY MOUTH THREE TIMES DAILY   [DISCONTINUED] pregabalin (LYRICA) 100 MG capsule TAKE ONE CAPSULE BY MOUTH FOUR TIMES DAILY   [DISCONTINUED] rOPINIRole (REQUIP) 2 MG tablet TAKE 1 TABLET BY MOUTH TWICE DAILY   [DISCONTINUED] warfarin (COUMADIN) 5 MG tablet Take 1 tablet (5 mg total) by mouth daily. (Patient taking differently: Take 5 mg by mouth daily. PCP manages.)   No  facility-administered medications prior to visit.    Review of Systems per HPI     Objective    BP (!) 114/57 (BP Location: Left Arm, Patient Position: Sitting, Cuff Size: Large)   Pulse (!) 58   Temp 98.1 F (36.7 C) (Temporal)   Resp 12   Ht 5\' 10"  (1.778 m)   Wt 228 lb 11.2 oz (103.7 kg)   LMP  (LMP Unknown)   SpO2 96%   BMI 32.82 kg/m    Physical Exam Vitals reviewed.  Constitutional:      General: She is not in acute distress.    Appearance: Normal appearance. She is well-developed. She is not diaphoretic.  HENT:     Head: Normocephalic and atraumatic.  Eyes:     General: No scleral icterus.    Conjunctiva/sclera: Conjunctivae normal.  Neck:     Thyroid: No thyromegaly.  Cardiovascular:     Rate and Rhythm: Normal rate and regular rhythm.     Pulses: Normal pulses.     Heart  sounds: Normal heart sounds. No murmur heard. Pulmonary:     Effort: Pulmonary effort is normal. No respiratory distress.     Breath sounds: Normal breath sounds. No wheezing, rhonchi or rales.  Musculoskeletal:     Cervical back: Neck supple.  Lymphadenopathy:     Cervical: No cervical adenopathy.  Skin:    General: Skin is warm and dry.     Findings: No rash.  Neurological:     Mental Status: She is alert and oriented to person, place, and time. Mental status is at baseline.  Psychiatric:        Mood and Affect: Mood normal.        Behavior: Behavior normal.     Physical Exam   VITALS: BP- 114/57 SKIN: No rash or bruising on upper and mid back. Tender to light touch.       No results found for any visits on 05/09/23.  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)   Relevant Medications   atorvastatin (LIPITOR) 40 MG tablet   Other Relevant Orders   Comprehensive metabolic panel   Diabetic peripheral neuropathy (HCC) (Chronic)   Relevant Medications   rOPINIRole (REQUIP) 2 MG tablet   atorvastatin (LIPITOR) 40 MG tablet   pregabalin (LYRICA) 100 MG capsule   Hypothyroidism   Relevant Medications   levothyroxine (SYNTHROID) 125 MCG tablet   Type 2 diabetes mellitus with stage 3 chronic kidney disease, without long-term current use of insulin (HCC)   Relevant Medications   atorvastatin (LIPITOR) 40 MG tablet   Other Relevant Orders   Hemoglobin [name]   Hyperlipidemia associated with  type 2 diabetes mellitus (HCC)   Relevant Medications   atorvastatin (LIPITOR) 40 MG tablet   warfarin (COUMADIN) 5 MG tablet   Other Relevant Orders   Comprehensive metabolic panel   Lipid panel     Other   Chronic anticoagulation (Coumadin)   Relevant  Medications   warfarin (COUMADIN) 5 MG tablet   Other Relevant Orders   CBC   INR/PT   Neurogenic pain   Relevant Medications   pregabalin (LYRICA) 100 MG capsule   Other Visit Diagnoses     Chronic migraine without aura without status migrainosus, not intractable       Relevant Medications   atorvastatin (LIPITOR) 40 MG tablet   pregabalin (LYRICA) 100 MG capsule   warfarin (COUMADIN) 5 MG tablet   Other Relevant Orders   Ambulatory referral to Neurology           Migraine: Daily headaches with scalp tenderness, photophobia, and blurred vision. Excedrin provides temporary relief but headaches recur. Discussed the possibility of rebound headaches due to frequent use of Excedrin. -Refer to neurology for further evaluation and management. -Continue Excedrin as needed, avoid additional aspirin on days when Excedrin is taken.  Unexplained Upper/Mid Back Pain: Chronic pain described as feeling bruised, worsened by light touch. No visible rash or bruising. -Refer to neurology for further evaluation, considering possible nerve involvement.  Diabetes Mellitus: Well-controlled with previous [name] in prediabetic range. -Check [name] today. - diet controlled, no meds - ROI sent for last eye exam  Hyperlipidemia: On Atorvastatin 40mg  daily, but LDL was 105 at last check. -Check lipid panel today. If LDL is not under 70, consider increasing Atorvastatin dose.  Anticoagulation for May-Thurner Syndrome: On Coumadin 5mg  daily. -Check INR today. -Continue Coumadin 5mg  daily.  Urinary Incontinence: Chronic issue, managed with medications by urology. -Continue current management.  Chronic Low Back Pain with Sciatica, Degenerative Disc  Disease, and History of Multiple Back Surgeries: Managed by pain management. -Continue current management.  Bipolar Disorder and Depression: Managed by Dr. Tenny Craw. -Continue current management.  GERD: On Pantoprazole. -Continue Pantoprazole.  Hypothyroidism: Managed by endocrinology. -Continue current management.  Follow-up in 1 month for INR check.        Return in about 6 months (around 11/09/2023) for CPE.      Total time spent on today's visit was greater than 40 minutes, including both face-to-face time and nonface-to-face time personally spent on review of chart (labs and imaging), discussing labs and goals, discussing further work-up, treatment options, referrals to specialist, form completion, reviewing outside records, answering patient's questions, and coordinating care.    I, Shirlee Latch, MD, have reviewed all documentation for this visit. The documentation on 05/09/23 for the exam, diagnosis, procedures, and orders are all accurate and complete.   Ngan Qualls, Marzella Schlein, MD, MPH [facility]

## 2023-05-10 LAB — COMPREHENSIVE METABOLIC PANEL
ALT: 16 IU/L (ref 0–32)
AST: 17 IU/L (ref 0–40)
Albumin: 4.3 g/dL (ref 3.8–4.9)
Alkaline Phosphatase: 79 IU/L (ref 44–121)
BUN/Creatinine Ratio: 20 (ref 9–23)
BUN: 25 mg/dL — ABNORMAL HIGH (ref 6–24)
Bilirubin Total: 0.3 mg/dL (ref 0.0–1.2)
CO2: 23 mmol/L (ref 20–29)
Calcium: 9.4 mg/dL (ref 8.7–10.2)
Chloride: 103 mmol/L (ref 96–106)
Creatinine, Ser: 1.27 mg/dL — ABNORMAL HIGH (ref 0.57–1.00)
Globulin, Total: 2.9 g/dL (ref 1.5–4.5)
Glucose: 99 mg/dL (ref 70–99)
Potassium: 4.7 mmol/L (ref 3.5–5.2)
Sodium: 142 mmol/L (ref 134–144)
Total Protein: 7.2 g/dL (ref 6.0–8.5)
eGFR: 50 mL/min/{1.73_m2} — ABNORMAL LOW (ref >59)

## 2023-05-10 LAB — LIPID PANEL
Chol/HDL Ratio: 3.8 ratio (ref 0.0–4.4)
Cholesterol, Total: 177 mg/dL (ref 100–199)
HDL: 47 mg/dL (ref >39)
LDL Chol Calc (NIH): 109 mg/dL — ABNORMAL HIGH (ref 0–99)
Triglycerides: 115 mg/dL (ref 0–149)
VLDL Cholesterol Cal: 21 mg/dL (ref 5–40)

## 2023-05-10 LAB — CBC
Hematocrit: 36.6 % (ref 34.0–46.6)
Hemoglobin: 12.1 g/dL (ref 11.1–15.9)
MCH: 30.6 pg (ref 26.6–33.0)
MCHC: 33.1 g/dL (ref 31.5–35.7)
MCV: 92 fL (ref 79–97)
Platelets: 114 10*3/uL — ABNORMAL LOW (ref 150–450)
RBC: 3.96 x10E6/uL (ref 3.77–5.28)
RDW: 12.1 % (ref 11.7–15.4)
WBC: 4.7 10*3/uL (ref 3.4–10.8)

## 2023-05-10 LAB — PROTIME-INR
INR: 1.7 — ABNORMAL HIGH (ref 0.9–1.2)
Prothrombin Time: 17.9 s — ABNORMAL HIGH (ref 9.1–12.0)

## 2023-05-10 LAB — HEMOGLOBIN A1C
Est. average glucose Bld gHb Est-mCnc: 117 mg/dL
Hgb A1c MFr Bld: 5.7 % — ABNORMAL HIGH (ref 4.8–5.6)

## 2023-05-13 ENCOUNTER — Encounter: Payer: Self-pay | Admitting: Hematology

## 2023-05-16 ENCOUNTER — Encounter (HOSPITAL_COMMUNITY): Payer: Self-pay | Admitting: Psychiatry

## 2023-05-16 ENCOUNTER — Telehealth (HOSPITAL_COMMUNITY): Payer: MEDICAID | Admitting: Psychiatry

## 2023-05-16 DIAGNOSIS — F900 Attention-deficit hyperactivity disorder, predominantly inattentive type: Secondary | ICD-10-CM

## 2023-05-16 DIAGNOSIS — F5105 Insomnia due to other mental disorder: Secondary | ICD-10-CM | POA: Diagnosis not present

## 2023-05-16 DIAGNOSIS — F3130 Bipolar disorder, current episode depressed, mild or moderate severity, unspecified: Secondary | ICD-10-CM | POA: Diagnosis not present

## 2023-05-16 MED ORDER — AMPHETAMINE-DEXTROAMPHETAMINE 30 MG PO TABS: 1.0000 | ORAL_TABLET | Freq: Two times a day (BID) | ORAL | 0 refills | Status: DC

## 2023-05-16 MED ORDER — ALPRAZOLAM 1 MG PO TABS: 1.0000 mg | ORAL_TABLET | Freq: Two times a day (BID) | ORAL | 2 refills | Status: DC | PRN

## 2023-05-16 MED ORDER — TRAZODONE HCL 150 MG PO TABS: 150.0000 mg | ORAL_TABLET | Freq: Every day | ORAL | 2 refills | Status: DC

## 2023-05-16 MED ORDER — AMPHETAMINE-DEXTROAMPHETAMINE 30 MG PO TABS: 30.0000 mg | ORAL_TABLET | Freq: Two times a day (BID) | ORAL | 0 refills | Status: DC

## 2023-05-16 MED ORDER — FLUOXETINE HCL 40 MG PO CAPS: 40.0000 mg | ORAL_CAPSULE | Freq: Two times a day (BID) | ORAL | 2 refills | Status: DC

## 2023-05-16 NOTE — Progress Notes (Signed)
Virtual Visit via Telephone Note  I connected with Jane [name] on 05/16/23 at 10:20 AM EDT by telephone and verified that I am speaking with the correct person using two identifiers.  Location: Patient: home Provider: office   I discussed the limitations, risks, security and privacy concerns of performing an evaluation and management service by telephone and the availability of in person appointments. I also discussed with the patient that there may be a patient responsible charge related to this service. The patient expressed understanding and agreed to proceed.      I discussed the assessment and treatment plan with the patient. The patient was provided an opportunity to ask questions and all were answered. The patient agreed with the plan and demonstrated an understanding of the instructions.   The patient was advised to call back or seek an in-person evaluation if the symptoms worsen or if the condition fails to improve as anticipated.  I provided 15 minutes of non-face-to-face time during this encounter.   Diannia Ruder, MD  [facility] MD/PA/NP OP Progress Note  05/16/2023 10:38 AM Jane [name]  MRN:  [id number]  Chief Complaint:  Chief Complaint  Patient presents with   Anxiety   Depression   Follow-up   HPI: This patient is a 57 year old divorced white female who lives alone in [place]. She is on disability due to bipolar disorder.   The patient returns for follow-up after about 3 months.  She states that she is doing fairly well.  She is still dealing with migraine headaches and the pain across her back but she has been referred to neurology for these.  She still does not have a vehicle and feels lonely and isolated but does the best she can.  She denies serious depression or thoughts of self-harm.  She is only speaking to one of her daughters and the other 2 will talk to her.  She has a best friend who comes over periodically.  [place]  really get anywhere easily.  Her caseworker from social services takes her to medical appointments.  She is still focusing well with the Adderall. Visit Diagnosis:    ICD-10-CM   1. Bipolar I disorder, most recent episode depressed (HCC)  F31.30     2. Attention deficit hyperactivity disorder (ADHD), predominantly inattentive type  F90.0     3. Insomnia due to mental disorder  F51.05       Past Psychiatric History: Long-term outpatient treatment  Past Medical History:  Past Medical History:  Diagnosis Date   ADD (attention deficit disorder)    Anxiety    Back pain    Bilateral swelling of feet    Bipolar 1 disorder (HCC)    Bipolar 1 disorder (HCC)    Bipolar disorder (HCC)    Chewing difficulty    Chronic fatigue syndrome    Chronic kidney disease    Stage 3 kidney disease;dx by Dr. Kari Baars.    Constipation    Depression    Diabetes mellitus without complication (HCC)    diet controlled   Dyspnea    with exertion   GERD (gastroesophageal reflux disease)    Headache    migraines   High cholesterol    History of blood clots    History of DVT (deep vein thrombosis)    left leg   Hypertension    states under control with meds., has been on med. x 2 years   Hypothyroidism  Joint pain    Neuropathy    Obsessive-compulsive disorder    Peripheral vascular disease (HCC)    Prediabetes    Respiratory failure requiring intubation (HCC)    Restless leg syndrome    Shortness of breath    Sleep apnea    Thrombocytopenia (HCC) 09/15/2019   Trigger thumb of left hand 01/2018   Trigger thumb of right hand     Past Surgical History:  Procedure Laterality Date   ABDOMINAL HYSTERECTOMY  06/2016   complete   APPLICATION OF WOUND VAC Left 04/27/2019   Procedure: APPLICATION OF WOUND VAC LEFT GROIN;  Surgeon: Maeola Harman, MD;  Location: [facility] OR;  Service: Vascular;  Laterality: Left;   AV FISTULA PLACEMENT Left 11/24/2018   Procedure: ARTERIOVENOUS (AV)  FISTULA CREATION LEFT SFA TO LEFT FEMORAL VEIN;  Surgeon: Maeola Harman, MD;  Location: [facility] OR;  Service: Vascular;  Laterality: Left;   BACK SURGERY     CHOLECYSTECTOMY     COLONOSCOPY WITH PROPOFOL N/A 11/22/2019   Procedure: COLONOSCOPY WITH PROPOFOL;  Surgeon: Wyline Mood, MD;  Location: [facility] ENDOSCOPY;  Service: Gastroenterology;  Laterality: N/A;   COLONOSCOPY WITH PROPOFOL N/A 12/09/2022   Procedure: COLONOSCOPY WITH PROPOFOL;  Surgeon: Wyline Mood, MD;  Location: [facility] ENDOSCOPY;  Service: Gastroenterology;  Laterality: N/A;   FEMORAL ARTERY EXPLORATION  03/30/2019   Procedure: Left Common Femoral Artery and Vein Exploration;  Surgeon: Maeola Harman, MD;  Location: [facility] OR;  Service: Vascular;;   FEMORAL-FEMORAL BYPASS GRAFT Left 11/24/2018   Procedure: BYPASS GRAFT FEMORAL-FEMORAL VENOUS LEFT TO RIGHT PALMA PROCEDURE USING CRYOVEIN;  Surgeon: Maeola Harman, MD;  Location: [facility] OR;  Service: Vascular;  Laterality: Left;   GROIN DEBRIDEMENT Left 04/27/2019   Procedure: GROIN DEBRIDEMENT;  Surgeon: Maeola Harman, MD;  Location: [facility] OR;  Service: Vascular;  Laterality: Left;   INSERTION OF ILIAC STENT  03/30/2019   Procedure: Stent of left common, external iliac veins and left common femoral vein;  Surgeon: Maeola Harman, MD;  Location: [facility] OR;  Service: Vascular;;   KNEE ARTHROSCOPY WITH MENISCAL REPAIR Left 11/14/2020   Procedure: LEFT KNEE ARTHROSCOPY WITH PARTIAL MEDIAL MENISCECTOMY;  Surgeon: Vickki Hearing, MD;  Location: [facility]S;  Service: Orthopedics;  Laterality: Left;   LOWER EXTREMITY VENOGRAPHY N/A 08/17/2018   Procedure: LOWER EXTREMITY VENOGRAPHY - Central Venogram;  Surgeon: Maeola Harman, MD;  Location: [facility] INVASIVE CV LAB;  Service: Cardiovascular;  Laterality: N/A;   LOWER EXTREMITY VENOGRAPHY Bilateral 03/09/2019   Procedure: LOWER EXTREMITY VENOGRAPHY;  Surgeon: Maeola Harman, MD;  Location: [facility] INVASIVE  CV LAB;  Service: Cardiovascular;  Laterality: Bilateral;   LOWER EXTREMITY VENOGRAPHY Left 08/16/2019   Procedure: LOWER EXTREMITY VENOGRAPHY;  Surgeon: Maeola Harman, MD;  Location: [facility] INVASIVE CV LAB;  Service: Cardiovascular;  Laterality: Left;   LOWER EXTREMITY VENOGRAPHY Left 03/25/2022   Procedure: LOWER EXTREMITY VENOGRAPHY;  Surgeon: Maeola Harman, MD;  Location: [facility] INVASIVE CV LAB;  Service: Cardiovascular;  Laterality: Left;  IVUS   LUMBAR FUSION  11/21/2000   L5-S1   LUMBAR SPINE SURGERY     x 2 others   PATCH ANGIOPLASTY Left 03/30/2019   Procedure: Patch Angioplasty of the Left Common Femoral Vein using Venosure Biologic patch;  Surgeon: Maeola Harman, MD;  Location: [facility] OR;  Service: Vascular;  Laterality: Left;   PERIPHERAL VASCULAR INTERVENTION Left 08/16/2019   Procedure: PERIPHERAL VASCULAR INTERVENTION;  Surgeon: Maeola Harman, MD;  Location: [facility]  INVASIVE CV LAB;  Service: Cardiovascular;  Laterality: Left;  common femoral/femoral vein stent   PERIPHERAL VASCULAR INTERVENTION Left 03/25/2022   Procedure: PERIPHERAL VASCULAR INTERVENTION;  Surgeon: Maeola Harman, MD;  Location: [facility] INVASIVE CV LAB;  Service: Cardiovascular;  Laterality: Left;  COMMON FEMORAL VEIN   PERIPHERAL VASCULAR THROMBECTOMY Left 03/25/2022   Procedure: PERIPHERAL VASCULAR THROMBECTOMY;  Surgeon: Maeola Harman, MD;  Location: [facility] INVASIVE CV LAB;  Service: Cardiovascular;  Laterality: Left;  EXTREMITY/IVC   TRIGGER FINGER RELEASE Right 12/01/2017   Procedure: RELEASE TRIGGER FINGER/A-1 PULLEY RIGHT THUMB;  Surgeon: Betha Loa, MD;  Location: [facility];  Service: Orthopedics;  Laterality: Right;   TRIGGER FINGER RELEASE Left 01/26/2018   Procedure: LEFT TRIGGER THUMB RELEASE;  Surgeon: Betha Loa, MD;  Location: [facility];  Service: Orthopedics;  Laterality: Left;   ULTRASOUND GUIDANCE FOR VASCULAR ACCESS  Right 03/30/2019   Procedure: Ultrasound-guided cannulation right internal jugular vein;  Surgeon: Maeola Harman, MD;  Location: [facility] OR;  Service: Vascular;  Laterality: Right;    Family Psychiatric History: See below  Family History:  Family History  Problem Relation Age of Onset   Heart disease Mother    Hyperlipidemia Mother    Hypertension Mother    Bipolar disorder Mother    Stroke Mother    Depression Mother    Sleep apnea Mother    Obesity Mother    Diabetes Father    Heart disease Father    Hyperlipidemia Father    Hypertension Father    Sleep apnea Father    Obesity Father    Drug abuse Daughter    ADD / ADHD Daughter    Drug abuse Daughter    Anxiety disorder Daughter    Bipolar disorder Daughter    Hypertension Sister    Hypertension Brother    Hyperlipidemia Brother    Heart disease Brother    Bipolar disorder Maternal Aunt    Suicidality Maternal Aunt     Social History:  Social History   Socioeconomic History   Marital status: Divorced    Spouse name: Not on file   Number of children: 3   Years of education: Not on file   Highest education level: 11th grade  Occupational History   Not on file  Tobacco Use   Smoking status: Never   Smokeless tobacco: Never  Vaping Use   Vaping status: Never Used  Substance and Sexual Activity   Alcohol use: No   Drug use: No   Sexual activity: Not Currently    Partners: Male    Birth control/protection: Surgical  Other Topics Concern   Not on file  Social History Narrative   Not on file   Social Determinants of Health   Financial Resource Strain: High Risk (03/12/2023)   Overall Financial Resource Strain (CARDIA)    Difficulty of Paying Living Expenses: Hard  Food Insecurity: Food Insecurity Present (03/12/2023)   Hunger Vital Sign    Worried About Running Out of Food in the Last Year: Often true    Ran Out of Food in the Last Year: Often true  Transportation Needs: Unmet Transportation Needs  (03/12/2023)   PRAPARE - Transportation    Lack of Transportation (Medical): No    Lack of Transportation (Non-Medical): Yes  Physical Activity: Sufficiently Active (03/12/2023)   Exercise Vital Sign    Days of Exercise per Week: 7 days    Minutes of Exercise per Session: 40 min  Stress: No  Stress Concern Present (03/12/2023)   [employer] of Occupational Health - Occupational Stress Questionnaire    Feeling of Stress : Only a little  Social Connections: Socially Isolated (03/12/2023)   Social Connection and Isolation Panel [NHANES]    Frequency of Communication with Friends and Family: More than three times a week    Frequency of Social Gatherings with Friends and Family: Never    Attends Religious Services: Never    Database administrator or Organizations: No    Attends Engineer, structural: Not on file    Marital Status: Divorced    Allergies:  Allergies  Allergen Reactions   Aripiprazole Other (See Comments)    BECOMES  VIOLENT    Seroquel [Quetiapine Fumarate] Other (See Comments)    BECOMES VIOLENT   Chlorpromazine Other (See Comments)    SEVERE ANXIETY    Gabapentin Other (See Comments)    NIGHTMARES    Prednisone Hives   Quetiapine     Metabolic Disorder Labs: Lab Results  Component Value Date   HGBA1C 5.7 (H) 05/09/2023   MPG 131.24 11/06/2021   MPG 134.11 03/26/2019   No results found for: "PROLACTIN" Lab Results  Component Value Date   CHOL 177 05/09/2023   TRIG 115 05/09/2023   HDL 47 05/09/2023   CHOLHDL 3.8 05/09/2023   VLDL 37 11/06/2021   LDLCALC 109 (H) 05/09/2023   LDLCALC 105 (H) 11/07/2022   Lab Results  Component Value Date   TSH 1.440 11/07/2022   TSH 1.420 08/06/2022    Therapeutic Level Labs: No results found for: "LITHIUM" Lab Results  Component Value Date   VALPROATE 61.2 03/23/2014   No results found for: "CBMZ"  Current Medications: Current Outpatient Medications  Medication Sig Dispense Refill   ALPRAZolam  (XANAX) 1 MG tablet Take 1 tablet (1 mg total) by mouth 2 (two) times daily as needed. for anxiety 60 tablet 2   amphetamine-dextroamphetamine (ADDERALL) 30 MG tablet Take 1 tablet by mouth 2 (two) times daily. 60 tablet 0   amphetamine-dextroamphetamine (ADDERALL) 30 MG tablet Take 1 tablet by mouth 2 (two) times daily. 60 tablet 0   amphetamine-dextroamphetamine (ADDERALL) 30 MG tablet Take 1 tablet by mouth 2 (two) times daily. 60 tablet 0   aspirin EC 81 MG tablet Take 81 mg by mouth daily.     atorvastatin (LIPITOR) 40 MG tablet Take 1 tablet (40 mg total) by mouth daily. 90 tablet 3   cetirizine (ZYRTEC) 10 MG tablet Take 1 tablet (10 mg total) by mouth daily. 90 tablet 3   colchicine 0.6 MG tablet TAKE 2 TABLETS, TAKE 1 TABLET BY MOUTH 1 hour AFTER, THEN TAKE 1 TABLET EVERY DAY FOR 7 DAYS 10 tablet 2   estradiol (ESTRACE VAGINAL) 0.1 MG/GM vaginal cream Apply 0.5mg  (pea-sized amount)  just inside the vaginal introitus with a finger-tip on Monday, Wednesday and Friday nights. 30 g 12   famotidine (PEPCID) 20 MG tablet Take 20 mg by mouth daily.     FLUoxetine (PROZAC) 40 MG capsule Take 1 capsule (40 mg total) by mouth 2 (two) times daily. 180 capsule 2   GARLIC PO Take 1 tablet by mouth daily.     HYDROcodone-acetaminophen (NORCO) 10-325 MG tablet Take 1 tablet by mouth every 6 (six) hours as needed. To last 30 days from fill date 120 tablet 0   [START ON 05/25/2023] HYDROcodone-acetaminophen (NORCO) 10-325 MG tablet Take 1 tablet by mouth every 6 (six) hours as needed. To last  30 days from fill date 120 tablet 0   ketoconazole (NIZORAL) 2 % shampoo Apply topically daily.     levothyroxine (SYNTHROID) 125 MCG tablet Take 1 tablet (125 mcg total) by mouth daily before breakfast. 90 tablet 3   linaclotide (LINZESS) 145 MCG CAPS capsule Take 1 capsule (145 mcg total) by mouth daily. 90 capsule 3   methocarbamol (ROBAXIN) 750 MG tablet Take 1 tablet (750 mg total) by mouth 2 (two) times daily as  needed for muscle spasms. 60 tablet 5   Multiple Vitamin (MULTIVITAMIN) capsule Take 1 capsule by mouth daily.     omega-3 acid ethyl esters (LOVAZA) 1 g capsule Take by mouth.     pantoprazole (PROTONIX) 40 MG tablet Take 1 tablet (40 mg total) by mouth daily. 90 tablet 3   polyethylene glycol powder (GLYCOLAX/MIRALAX) 17 GM/SCOOP powder Take 17 g by mouth daily. 850 g 1   potassium chloride (KLOR-CON M) 10 MEQ tablet Take 1 tablet (10 mEq total) by mouth 3 (three) times daily. 270 tablet 3   pregabalin (LYRICA) 100 MG capsule Take 1 capsule (100 mg total) by mouth 4 (four) times daily. 120 capsule 5   rOPINIRole (REQUIP) 2 MG tablet Take 1 tablet (2 mg total) by mouth 2 (two) times daily. 180 tablet 1   Semaglutide, 1 MG/DOSE, 4 MG/3ML SOPN Inject 1 mg as directed once a week. 9 mL 0   sulfamethoxazole-trimethoprim (BACTRIM DS) 800-160 MG tablet Take 1 tablet by mouth every 12 (twelve) hours. 14 tablet 0   traZODone (DESYREL) 150 MG tablet Take 1 tablet (150 mg total) by mouth at bedtime. 30 tablet 2   trimethoprim (TRIMPEX) 100 MG tablet Take 1 tablet (100 mg total) by mouth daily. 90 tablet 0   valACYclovir (VALTREX) 1000 MG tablet Take 2 tablets by mouth at first onset of fever blisters, then take 2 tablets by mouth 12 hours later. 30 tablet 3   Vibegron (GEMTESA) 75 MG TABS Take 1 tablet (75 mg total) by mouth daily. 90 tablet 3   warfarin (COUMADIN) 5 MG tablet Take 1 tablet (5 mg total) by mouth daily. 90 tablet 3   No current facility-administered medications for this visit.     Musculoskeletal: Strength & Muscle Tone: within normal limits Gait & Station: normal Patient leans: N/A  Psychiatric Specialty Exam: Review of Systems  Musculoskeletal:  Positive for gait problem and myalgias.  Neurological:  Positive for headaches.  All other systems reviewed and are negative.   There were no vitals taken for this visit.There is no height or weight on file to calculate BMI.  General  Appearance: Casual and Fairly Groomed  Eye Contact:  Good  Speech:  Clear and Coherent  Volume:  Normal  Mood:  Euthymic  Affect:  Congruent  Thought Process:  Goal Directed  Orientation:  Full (Time, Place, and Person)  Thought Content: WDL   Suicidal Thoughts:  No  Homicidal Thoughts:  No  Memory:  Immediate;   Good Recent;   Good Remote;   Good  Judgement:  Good  Insight:  Good  Psychomotor Activity:  Decreased  Concentration:  Concentration: Good and Attention Span: Good  Recall:  Good  Fund of Knowledge: Good  Language: Good  Akathisia:  No  Handed:  Right  AIMS (if indicated): not done  Assets:  Communication Skills Desire for Improvement Resilience Social Support  ADL's:  Intact  Cognition: WNL  Sleep:  Good   Screenings: PHQ2-9    Flowsheet  Row Office Visit from 05/09/2023 in [facility] Office Visit from 03/14/2023 in [facility] Video Visit from 01/02/2023 in [facility] [place] Office Visit from 11/07/2022 in [facility] Office Visit from 09/12/2022 in [facility] Total Score 2 4 5 3  0  PHQ-9 Total Score 10 18 15 10  --      Flowsheet Row ED from 01/30/2023 in [facility] Video Visit from 01/02/2023 in [facility] [place] Admission (Discharged) from 12/09/2022 in [facility]  C-SSRS RISK CATEGORY No Risk No Risk No Risk        Assessment and Plan: This patient is a 57 year old female with a history of depression anxiety and ADD.  She continues to do well on her current regimen.  She will continue Prozac 80 mg daily for depression, Xanax 1 mg twice daily for anxiety, Adderall 30 mg twice daily for ADD and trazodone 150 mg at bedtime as needed for sleep.  She will return to see me  in 3 months  Collaboration of Care: Collaboration of Care: Primary Care Provider AEB notes are shared with PCP on the epic system  Patient/Guardian was advised Release of Information must be obtained prior to any record release in order to collaborate their care with an outside provider. Patient/Guardian was advised if they have not already done so to contact the registration department to sign all necessary forms in order for [place] to release information regarding their care.   Consent: Patient/Guardian gives verbal consent for treatment and assignment of benefits for services provided during this visit. Patient/Guardian expressed understanding and agreed to proceed.    Diannia Ruder, MD 05/16/2023, 10:38 AM

## 2023-05-20 ENCOUNTER — Encounter: Payer: Self-pay | Admitting: Hematology

## 2023-05-21 ENCOUNTER — Ambulatory Visit (INDEPENDENT_AMBULATORY_CARE_PROVIDER_SITE_OTHER): Payer: MEDICAID

## 2023-05-21 DIAGNOSIS — Z7901 Long term (current) use of anticoagulants: Secondary | ICD-10-CM | POA: Diagnosis not present

## 2023-05-21 LAB — POCT INR
INR: 1.9 — AB (ref 2.0–3.0)
POC INR: 1.9
PT: 22.8

## 2023-05-21 NOTE — Patient Instructions (Signed)
Description   7 mg every Wednesday and Saturday 5 mg daily. F/U in 4 weeks.

## 2023-05-22 NOTE — Telephone Encounter (Signed)
.  left message to have patient return my call. Need to know if patient ever got medication

## 2023-05-23 NOTE — Telephone Encounter (Signed)
Patient returned call. She stated she did not get medication; that she never received a call to get it.

## 2023-05-26 ENCOUNTER — Ambulatory Visit: Payer: Medicaid Other | Admitting: Urology

## 2023-05-26 NOTE — Telephone Encounter (Signed)
Pt has appt today with macdiarmid

## 2023-05-27 ENCOUNTER — Other Ambulatory Visit (HOSPITAL_COMMUNITY): Payer: Self-pay

## 2023-05-27 ENCOUNTER — Other Ambulatory Visit: Payer: Self-pay

## 2023-05-27 ENCOUNTER — Encounter (INDEPENDENT_AMBULATORY_CARE_PROVIDER_SITE_OTHER): Payer: Self-pay | Admitting: Family Medicine

## 2023-05-27 ENCOUNTER — Ambulatory Visit (INDEPENDENT_AMBULATORY_CARE_PROVIDER_SITE_OTHER): Payer: MEDICAID | Admitting: Family Medicine

## 2023-05-27 ENCOUNTER — Encounter: Payer: Self-pay | Admitting: Hematology

## 2023-05-27 VITALS — BP 102/52 | HR 64 | Temp 98.0°F | Ht 68.0 in | Wt 224.0 lb

## 2023-05-27 DIAGNOSIS — I871 Compression of vein: Secondary | ICD-10-CM | POA: Diagnosis not present

## 2023-05-27 DIAGNOSIS — E669 Obesity, unspecified: Secondary | ICD-10-CM

## 2023-05-27 DIAGNOSIS — E1165 Type 2 diabetes mellitus with hyperglycemia: Secondary | ICD-10-CM | POA: Diagnosis not present

## 2023-05-27 DIAGNOSIS — Z6834 Body mass index (BMI) 34.0-34.9, adult: Secondary | ICD-10-CM | POA: Diagnosis not present

## 2023-05-27 DIAGNOSIS — Z7985 Long-term (current) use of injectable non-insulin antidiabetic drugs: Secondary | ICD-10-CM

## 2023-05-27 MED ORDER — SEMAGLUTIDE (1 MG/DOSE) 4 MG/3ML ~~LOC~~ SOPN
1.0000 mg | PEN_INJECTOR | SUBCUTANEOUS | 0 refills | Status: DC
Start: 2023-05-27 — End: 2023-08-13
  Filled 2023-05-27: qty 9, 84d supply, fill #0

## 2023-05-27 NOTE — Progress Notes (Unsigned)
Chief Complaint:   OBESITY Jane [name] is here to discuss her progress with her obesity treatment plan along with follow-up of her obesity related diagnoses. Aalani is on the Category 3 Plan and states she is following her eating plan approximately 75% of the time. Amita states she is doing 0 minutes 0 times per week.  Today's visit was #: 40 Starting weight: 283 lbs Starting date: 03/29/2020 Today's weight: 224 lbs Today's date: 05/27/2023 Total lbs lost to date: 59 Total lbs lost since last in-office visit: 0  Interim History: Patient is feeling very fatigued.  She mentions yesterday she was so tired she slept most of the time.  She isn't sure why she is so tired.  Next few weeks she has quite a few doctors appointments and is trying to plan a trip to the beach in September.  Mentions she may be getting in a bit more chocolate recently.  She is doing some crafts and is trying to keep busy.  Subjective:   1. Type 2 diabetes mellitus with hyperglycemia, without long-term current use of insulin (HCC) Patient is on Ozempic 1 mg with significant fatigue, but improvement in [name] (5.7 on 05/09/2023).   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  Her last INR was 1.9.  Assessment/Plan:   1. Type 2 diabetes mellitus with hyperglycemia, without long-term current use of insulin (HCC) Patient will continue Ozempic 1 mg once weekly, and we will refill for 90 days.  - Semaglutide, 1 MG/DOSE, 4 MG/3ML SOPN; Inject 1 mg as directed once a week.  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   3. BMI 34.0-34.9,adult  4. Obesity with starting BMI of 43.0 Jane [name] is currently in the action stage of change. As such, her goal is to continue with weight loss efforts. She has agreed to the Category 3 Plan.   Exercise goals: All adults should avoid inactivity. Some physical activity is better than none, and adults  who participate in any amount of physical activity gain some health benefits.  Patient is to increase her total fitness activity at home.  Behavioral modification strategies: increasing lean protein intake, meal planning and cooking strategies, keeping healthy foods in the home, and planning for success.  Juliona has agreed to follow-up with our clinic in 4 weeks. She was informed of the importance of frequent follow-up visits to maximize her success with intensive lifestyle modifications for her multiple health conditions.   Objective:   Blood pressure (!) 102/52, pulse 64, temperature 98 F (36.7 C), height 5\' 8"  (1.727 m), weight 224 lb (101.6 kg), SpO2 97%. Body mass index is 34.06 kg/m.  General: Cooperative, alert, well developed, in no acute distress. HEENT: Conjunctivae and lids unremarkable. Cardiovascular: Regular rhythm.  Lungs: Normal work of breathing. Neurologic: No focal deficits.   Lab Results  Component Value Date   CREATININE 1.27 (H) 05/09/2023   BUN 25 (H) 05/09/2023   NA 142 05/09/2023   K 4.7 05/09/2023   CL 103 05/09/2023   CO2 23 05/09/2023   Lab Results  Component Value Date   ALT 16 05/09/2023   AST 17 05/09/2023   ALKPHOS 79 05/09/2023   BILITOT 0.3 05/09/2023   Lab Results  Component Value Date   HGBA1C 5.7 (H) 05/09/2023   HGBA1C 5.9 (H) 11/07/2022   HGBA1C 5.8 (A) 04/22/2022   HGBA1C 6.2 (H) 11/06/2021   HGBA1C 6.3 (H)  03/22/2021   Lab Results  Component Value Date   INSULIN 21.5 03/29/2020   Lab Results  Component Value Date   TSH 1.440 11/07/2022   Lab Results  Component Value Date   CHOL 177 05/09/2023   HDL 47 05/09/2023   LDLCALC 109 (H) 05/09/2023   TRIG 115 05/09/2023   CHOLHDL 3.8 05/09/2023   Lab Results  Component Value Date   VD25OH 48.8 03/14/2023   VD25OH 47.10 02/21/2022   VD25OH 50.60 01/01/2021   Lab Results  Component Value Date   WBC 4.7 05/09/2023   HGB 12.1 05/09/2023   HCT 36.6 05/09/2023   MCV 92  05/09/2023   PLT 114 (L) 05/09/2023   Lab Results  Component Value Date   IRON 93 02/24/2023   TIBC 322 02/24/2023   FERRITIN 214 02/24/2023   Attestation Statements:   Reviewed by clinician on day of visit: allergies, medications, problem list, medical history, surgical history, family history, social history, and previous encounter notes.   I, Burt Knack, am acting as transcriptionist for Reuben Likes, MD.  I have reviewed the above documentation for accuracy and completeness, and I agree with the above. - Reuben Likes, MD

## 2023-05-28 ENCOUNTER — Encounter: Payer: Self-pay | Admitting: Podiatry

## 2023-05-28 ENCOUNTER — Ambulatory Visit: Payer: MEDICAID | Admitting: Podiatry

## 2023-05-28 VITALS — BP 117/70 | HR 56

## 2023-05-28 DIAGNOSIS — M2042 Other hammer toe(s) (acquired), left foot: Secondary | ICD-10-CM

## 2023-05-28 NOTE — Progress Notes (Signed)
  Subjective:  Patient ID: Jane [name], female    DOB: 28-May-1966,  MRN: [id number]  Chief Complaint  Patient presents with   Toe Pain    "I think we're going to discuss if we're going to do surgery or not."    58 y.o. female presents with the above complaint. History confirmed with patient.  She returns for follow-up of her second toe, pain is improving she has been having Qutenza treatment with Dr. Lourdes Sledge, she is also dealing with fluctuations in her INR that they are trying to get under control  Objective:  Physical Exam: warm, good capillary refill, no trophic changes or ulcerative lesions, normal DP and PT pulses, and semireducible lesser hammertoes, semirigid hammertoe of the second toe left foot  Assessment:   1. Hammertoe of second toe of left foot      Plan:  Patient was evaluated and treated and all questions answered.  We discussed her ongoing treatment.  It we agreed that it likely be best to hold off on surgery at this point until they are able to stabilize her INR is on top of the fact that she is getting more pain relief now with pain interventions.  Does not feel that the pain in the toe right now is severe enough to proceed with the risk of surgeries, in her case would be significant risk for blood clots after surgery.  Additionally disruption of her warfarin will require transition to Lovenox and further adjustments in her INR.  I will see her back as needed if she would like to proceed with surgery again if it worsens.  Return if symptoms worsen or fail to improve.

## 2023-06-11 ENCOUNTER — Ambulatory Visit (INDEPENDENT_AMBULATORY_CARE_PROVIDER_SITE_OTHER): Payer: MEDICAID | Admitting: Family Medicine

## 2023-06-11 DIAGNOSIS — Z86718 Personal history of other venous thrombosis and embolism: Secondary | ICD-10-CM

## 2023-06-11 DIAGNOSIS — N183 Chronic kidney disease, stage 3 unspecified: Secondary | ICD-10-CM

## 2023-06-11 LAB — POCT INR: INR: 2.4 (ref 2.0–3.0)

## 2023-06-11 NOTE — Progress Notes (Signed)
Pt seen for POC INR check. INR within range. No changes made to her treatment plan.  No PE was performed by NP.  Leilani Merl, FNP, have reviewed all documentation for this visit. The documentation on 06/11/23 for the exam, diagnosis, procedures, and orders are all accurate and complete.

## 2023-06-11 NOTE — Patient Instructions (Signed)
Description   7 mg every Wednesday and Saturday 5 mg daily. F/U in 4 weeks.

## 2023-06-12 ENCOUNTER — Encounter: Payer: Self-pay | Admitting: Hematology

## 2023-06-17 ENCOUNTER — Ambulatory Visit
Payer: MEDICAID | Attending: Student in an Organized Health Care Education/Training Program | Admitting: Student in an Organized Health Care Education/Training Program

## 2023-06-17 ENCOUNTER — Encounter: Payer: Self-pay | Admitting: Student in an Organized Health Care Education/Training Program

## 2023-06-17 VITALS — BP 130/74 | HR 59 | Temp 97.2°F | Resp 16 | Ht 68.0 in | Wt 224.0 lb

## 2023-06-17 DIAGNOSIS — M7918 Myalgia, other site: Secondary | ICD-10-CM | POA: Insufficient documentation

## 2023-06-17 DIAGNOSIS — G894 Chronic pain syndrome: Secondary | ICD-10-CM | POA: Insufficient documentation

## 2023-06-17 DIAGNOSIS — Z0289 Encounter for other administrative examinations: Secondary | ICD-10-CM | POA: Diagnosis present

## 2023-06-17 DIAGNOSIS — G8929 Other chronic pain: Secondary | ICD-10-CM | POA: Insufficient documentation

## 2023-06-17 DIAGNOSIS — M792 Neuralgia and neuritis, unspecified: Secondary | ICD-10-CM | POA: Diagnosis not present

## 2023-06-17 DIAGNOSIS — I871 Compression of vein: Secondary | ICD-10-CM | POA: Diagnosis present

## 2023-06-17 DIAGNOSIS — Z7985 Long-term (current) use of injectable non-insulin antidiabetic drugs: Secondary | ICD-10-CM

## 2023-06-17 DIAGNOSIS — M961 Postlaminectomy syndrome, not elsewhere classified: Secondary | ICD-10-CM | POA: Diagnosis not present

## 2023-06-17 DIAGNOSIS — M546 Pain in thoracic spine: Secondary | ICD-10-CM | POA: Diagnosis not present

## 2023-06-17 DIAGNOSIS — E1142 Type 2 diabetes mellitus with diabetic polyneuropathy: Secondary | ICD-10-CM | POA: Diagnosis not present

## 2023-06-17 MED ORDER — HYDROCODONE-ACETAMINOPHEN 10-325 MG PO TABS
1.0000 | ORAL_TABLET | Freq: Four times a day (QID) | ORAL | 0 refills | Status: AC | PRN
Start: 2023-06-24 — End: 2023-07-24

## 2023-06-17 MED ORDER — HYDROCODONE-ACETAMINOPHEN 10-325 MG PO TABS
1.0000 | ORAL_TABLET | Freq: Four times a day (QID) | ORAL | 0 refills | Status: DC | PRN
Start: 2023-08-23 — End: 2023-09-11

## 2023-06-17 MED ORDER — HYDROCODONE-ACETAMINOPHEN 10-325 MG PO TABS
1.0000 | ORAL_TABLET | Freq: Four times a day (QID) | ORAL | 0 refills | Status: AC | PRN
Start: 2023-07-24 — End: 2023-08-23

## 2023-06-17 NOTE — Progress Notes (Signed)
Nursing Pain Medication Assessment:  Safety precautions to be maintained throughout the outpatient stay will include: orient to surroundings, keep bed in low position, maintain call bell within reach at all times, provide assistance with transfer out of bed and ambulation.  Medication Inspection Compliance: Pill count conducted under aseptic conditions, in front of the patient. Neither the pills nor the bottle was removed from the patient's sight at any time. Once count was completed pills were immediately returned to the patient in their original bottle.  Medication: Hydrocodone/APAP Pill/Patch Count:  29 of 120 pills remain Pill/Patch Appearance: Markings consistent with prescribed medication Bottle Appearance: Standard pharmacy container. Clearly labeled. Filled Date: 7 / 21 / 2024 Last Medication intake:  Today

## 2023-06-17 NOTE — Progress Notes (Signed)
PROVIDER NOTE: Information contained herein reflects review and annotations entered in association with encounter. Interpretation of such information and data should be left to medically-trained personnel. Information provided to patient can be located elsewhere in the medical record under "Patient Instructions". Document created using STT-dictation technology, any transcriptional errors that may result from process are unintentional.    Patient: Jane [name]  Service Category: E/M  Provider: Edward Jolly, MD  DOB: 17-Jun-1966  DOS: 06/17/2023  Specialty: Interventional Pain Management  MRN: [id number]  Setting: Ambulatory outpatient  PCP: Jane Downer, MD  Type: Established Patient    Referring Provider: Erasmo Downer, MD  Location: Office  Delivery: Face-to-face     HPI  [name], a 57 y.o. year old female, is here today because of her Diabetic peripheral neuropathy (HCC) [E11.42]. [name] primary complain today is Peripheral Neuropathy (Foot neuropathy), Back Pain (lower), and Knee Pain (left)  Last encounter: My last encounter with her was on 12/19/22  Pertinent problems: 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 Pain Assessment: Severity of Neuropathic pain is reported as a 7 /10. Location: Foot Right, Left/denies. Onset: More than a month ago. Quality: Numbness, Tingling. Timing: Intermittent. Modifying factor(s): Qutenza. Vitals:  height is 5\' 8"  (1.727 m) and weight is 224 lb (101.6 kg). Her temperature is 97.2 F (36.2 C) (abnormal). Her blood pressure is 130/74 and her pulse is 59 (abnormal). Her respiration is 16 and oxygen saturation is 99%.   Reason for encounter: both, medication management and post-procedure assessment.      Post-procedure evaluation   Type: Qutenza Neurolysis #1  Laterality:  Bilateral Area treated: Feet Imaging Guidance: None Anesthesia/analgesia/anxiolysis/sedation: None required Medication (Right): Qutenza (capsaicin 8%) topical system Medication (Left): Qutenza (capsaicin 8%) topical system Date: 04/02/2023 Performed by: Jane Jolly, MD Rationale (medical necessity): procedure needed and proper for the treatment of Jane [name]'s medical symptoms and needs. 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 1. Diabetic peripheral neuropathy (HCC)    NAS-11 Pain score:   Pre-procedure: 8 /10   Post-procedure: 8 /10      Effectiveness:  Initial hour after procedure: 75 %  Subsequent 4-6 hours post-procedure:  Analgesia past initial 6 hours: 100 % (X 2.5 months)  Ongoing improvement:  Analgesic:  less than 50%     Pharmacotherapy Assessment    Analgesic: Hydrocodone 10 mg 4 times daily as needed, quantity 120/month; MME equals 40   Monitoring: [place] PMP: PDMP reviewed during this encounter.       Pharmacotherapy: No side-effects or adverse reactions reported. Compliance: No problems identified. Effectiveness: Clinically acceptable.  UDS:  Summary  Date Value Ref Range Status  06/20/2022 Note  Final    Comment:     ==================================================================== [employer] Select 13 (MW) ==================================================================== Test  Result       Flag       Units  Drug Present and Declared for Prescription Verification   Hydrocodone                    2725         EXPECTED   ng/mg creat   Dihydrocodeine                 371          EXPECTED   ng/mg creat   Norhydrocodone                 1296         EXPECTED   ng/mg creat    Sources of hydrocodone include scheduled prescription medications.    Dihydrocodeine and norhydrocodone are expected metabolites of    hydrocodone. Dihydrocodeine is also available as a scheduled    prescription medication.  Drug Present not Declared for Prescription Verification   Amphetamine                    10168        UNEXPECTED ng/mg creat    Amphetamine is available as a schedule II prescription drug.  Drug Absent but Declared for Prescription Verification   Alprazolam                     Not Detected UNEXPECTED ng/mg creat ==================================================================== Test                      Result    Flag   Units      Ref Range   Creatinine              85               mg/dL      >=43 ==================================================================== Declared Medications:  The flagging and interpretation on this report are based on the  following declared medications.  Unexpected results may arise from  inaccuracies in the declared medications.   **Note: The testing scope of this panel includes these medications:   Alprazolam (Xanax)  Hydrocodone (Norco)   **Note: The testing scope of this panel does not include the  following reported medications:   Acetaminophen (Excedrin)  Acetaminophen (Norco)  Amitriptyline (Elavil)  Aspirin  Aspirin (Excedrin)  Atorvastatin (Lipitor)  Caffeine (Excedrin)  Cetirizine (Zyrtec)  Colchicine  Fluoxetine (Prozac)   Hydrochlorothiazide (Hydrodiuril)  Levothyroxine (Synthroid)  Linaclotide (Linzess)  Lisinopril (Zestril)  Methocarbamol (Robaxin)  Multivitamin  Omega-3 Fatty Acids  Pantoprazole (Protonix)  Potassium (Klor-Con)  Pregabalin (Lyrica)  Ropinirole (Requip)  Semaglutide  Supplement  Vibegron (Gemtesa)  Warfarin (Coumadin) ==================================================================== For clinical consultation, please call [phone]. ====================================================================       ROS  Constitutional: Denies any fever or chills Gastrointestinal: No reported hemesis, hematochezia, vomiting, or acute GI distress Musculoskeletal: low back pain Neurological:  parasthesias bilateral feet  Medication Review  ALPRAZolam, FLUoxetine, Garlic, HYDROcodone-acetaminophen, Semaglutide (1 MG/DOSE), Vibegron, amphetamine-dextroamphetamine, aspirin EC, atorvastatin, cetirizine, estradiol, famotidine, ketoconazole, levothyroxine, linaclotide, methocarbamol, multivitamin, omega-3 acid ethyl esters, pantoprazole, polyethylene glycol powder, potassium chloride, pregabalin, rOPINIRole, traZODone, valACYclovir, and warfarin  History Review  Allergy: [name] is allergic to aripiprazole, seroquel [quetiapine fumarate], chlorpromazine, gabapentin, prednisone, and quetiapine. Drug: [name]  reports no history of drug use. Alcohol:  reports no history of alcohol use. Tobacco:  reports that she has never smoked. She has never used smokeless tobacco.  Social: [name]  reports that she has never smoked. She has never used smokeless tobacco. She reports that she does not drink alcohol and does not use drugs. Medical:  has a past medical history of ADD (attention deficit disorder), Anxiety, Back pain, Bilateral swelling of feet, Bipolar 1 disorder (HCC), Bipolar 1 disorder (HCC), Bipolar disorder (HCC), Chewing difficulty, Chronic fatigue syndrome, Chronic  kidney disease, Constipation, Depression, Diabetes mellitus without complication (HCC), Dyspnea, GERD (gastroesophageal reflux disease), Headache, High cholesterol, History of blood clots, History of DVT (deep vein thrombosis), Hypertension, Hypothyroidism, Joint pain, Neuropathy, Obsessive-compulsive disorder, Peripheral vascular disease (HCC), Prediabetes, Respiratory failure requiring intubation (HCC), Restless leg syndrome, Shortness of breath, Sleep apnea, Thrombocytopenia (HCC) (09/15/2019), Trigger thumb of left hand (01/2018), and Trigger thumb of right hand. Surgical: [name]  has a past surgical history that includes Cholecystectomy; Trigger finger release (Right, 12/01/2017); Abdominal hysterectomy (06/2016); Lumbar fusion (11/21/2000); Lumbar spine surgery; Trigger finger release (Left, 01/26/2018); LOWER EXTREMITY VENOGRAPHY (N/A, 08/17/2018); Femoral-femoral Bypass Graft (Left, 11/24/2018); AV fistula placement (Left, 11/24/2018); LOWER EXTREMITY VENOGRAPHY (Bilateral, 03/09/2019); Patch angioplasty (Left, 03/30/2019); Ultrasound guidance for vascular access (Right, 03/30/2019); Femoral artery debridement (03/30/2019); Insertion of iliac stent (03/30/2019); Groin debridement (Left, 04/27/2019); Application if wound vac (Left, 04/27/2019); LOWER EXTREMITY VENOGRAPHY (Left, 08/16/2019); PERIPHERAL VASCULAR INTERVENTION (Left, 08/16/2019); Back surgery; Colonoscopy with propofol (N/A, 11/22/2019); Knee arthroscopy with meniscal repair (Left, 11/14/2020); LOWER EXTREMITY VENOGRAPHY (Left, 03/25/2022); PERIPHERAL VASCULAR THROMBECTOMY (Left, 03/25/2022); PERIPHERAL VASCULAR INTERVENTION (Left, 03/25/2022); and Colonoscopy with propofol (N/A, 12/09/2022). Family: family history includes ADD / ADHD in her daughter; Anxiety disorder in her daughter; Bipolar disorder in her daughter, maternal aunt, and mother; Depression in her mother; Diabetes in her father; Drug abuse in her daughter and daughter; Heart disease in her  brother, father, and mother; Hyperlipidemia in her brother, father, and mother; Hypertension in her brother, father, mother, and sister; Obesity in her father and mother; Sleep apnea in her father and mother; Stroke in her mother; Suicidality in her maternal aunt.  Laboratory Chemistry Profile   Renal Lab Results  Component Value Date   BUN 25 (H) 05/09/2023   CREATININE 1.27 (H) 05/09/2023   BCR 20 05/09/2023   GFRAA 53 (L) 07/18/2020   GFRNONAA 45 (L) 01/30/2023     Hepatic Lab Results  Component Value Date   AST 17 05/09/2023   ALT 16 05/09/2023   ALBUMIN 4.3 05/09/2023   ALKPHOS 79 05/09/2023   HCVAB NON REACTIVE 11/06/2021     Electrolytes Lab Results  Component Value Date   NA 142 05/09/2023   K 4.7 05/09/2023   CL 103 05/09/2023   CALCIUM 9.4 05/09/2023   MG 2.1 08/06/2022   PHOS 4.0 04/04/2019     Bone Lab Results  Component Value Date   VD25OH 48.8 03/14/2023     Inflammation (CRP: Acute Phase) (ESR: Chronic Phase) Lab Results  Component Value Date   CRP 11 (H) 02/16/2020   ESRSEDRATE 33 02/16/2020   LATICACIDVEN 1.2 01/05/2020       Note: Above Lab results reviewed.  Recent Imaging Review  VAS [place] ABI WITH/WO TBI  LOWER EXTREMITY DOPPLER STUDY  Patient Name:  CHRISTEN GADDY  Date of Exam:   04/21/2023 Medical Rec #: [id number]        Accession #:    [id number] Date of Birth: 04/11/1966        Patient Gender: F Patient Age:   34 years Exam Location:  [facility] Procedure:  VAS [place] ABI WITH/WO TBI Referring Phys: ADAM MCDONALD  --------------------------------------------------------------------------------   Indications: Claudication. Pre op hammer toe surgery  High Risk Factors: Current smoker.  Other Factors: Pre diabetes                History of back surgery.  Performing Technologist: Thereasa Parkin RVT    Examination Guidelines: A complete evaluation includes at minimum, Doppler waveform signals and systolic  blood pressure reading at the level of bilateral brachial, anterior tibial, and posterior tibial arteries, when vessel segments are accessible. Bilateral testing is considered an integral part of a complete examination. Photoelectric Plethysmograph (PPG) waveforms and toe systolic pressure readings are included as required and additional duplex testing as needed. Limited examinations for reoccurring indications may be performed as noted.    ABI Findings: +---------+------------------+-----+-----------+--------+ Right    Rt Pressure (mmHg)IndexWaveform   Comment  +---------+------------------+-----+-----------+--------+ Brachial 103                                        +---------+------------------+-----+-----------+--------+ PTA      128               1.16 multiphasic         +---------+------------------+-----+-----------+--------+ DP       131               1.19 multiphasic         +---------+------------------+-----+-----------+--------+ Great Toe105               0.95                     +---------+------------------+-----+-----------+--------+  +---------+------------------+-----+-----------+-------+ Left     Lt Pressure (mmHg)IndexWaveform   Comment +---------+------------------+-----+-----------+-------+ Brachial 110                                       +---------+------------------+-----+-----------+-------+ PTA      87                0.79 multiphasic        +---------+------------------+-----+-----------+-------+ DP       87                0.79 multiphasic        +---------+------------------+-----+-----------+-------+ Great Toe88                0.80                    +---------+------------------+-----+-----------+-------+  +-------+-----------+-----------+------------+------------+ ABI/TBIToday's ABIToday's TBIPrevious ABIPrevious TBI +-------+-----------+-----------+------------+------------+ Right   1.19       0.95                                +-------+-----------+-----------+------------+------------+ Left   0.79       0.8                                 +-------+-----------+-----------+------------+------------+     No previous ABI.   Summary: Right: Resting right ankle-brachial index is within normal range. The right toe-brachial index is normal.  Left: Resting left ankle-brachial index indicates mild left lower extremity arterial disease. The left toe-brachial index is normal.  *See table(s) above for measurements and  observations.    Electronically signed by Coral Else MD on 04/21/2023 at 4:10:41 PM.      Final    Note: Reviewed        Physical Exam  General appearance: Well nourished, well developed, and well hydrated. In no apparent acute distress Mental status: Alert, oriented x 3 (person, place, & time)       Respiratory: No evidence of acute respiratory distress Eyes: PERLA Vitals: BP 130/74   Pulse (!) 59   Temp (!) 97.2 F (36.2 C)   Resp 16   Ht 5\' 8"  (1.727 m)   Wt 224 lb (101.6 kg)   LMP  (LMP Unknown)   SpO2 99%   BMI 34.06 kg/m  BMI: Estimated body mass index is 34.06 kg/m as calculated from the following:   Height as of this encounter: 5\' 8"  (1.727 m).   Weight as of this encounter: 224 lb (101.6 kg). Ideal: Ideal body weight: 63.9 kg (140 lb 14 oz) Adjusted ideal body weight: 79 kg (174 lb 2 oz)   Lumbar Spine Area Exam  Skin & Axial Inspection: Well healed scar from previous spine surgery detected Alignment: Asymmetric Functional ROM: Decreased ROM       Stability: No instability detected Muscle Tone/Strength: Functionally intact. No obvious neuro-muscular anomalies detected. Sensory (Neurological): Dermatomal pain pattern     Gait & Posture Assessment  Ambulation: Limited Gait: Antalgic gait (limping) Posture: Difficulty standing up straight, due to pain  Lower Extremity Exam      Side: Right lower extremity    Side: Left lower extremity  Stability: No instability observed           Stability: No instability observed          Skin & Extremity Inspection: Skin color, temperature, and hair growth are WNL. No peripheral edema or cyanosis. No masses, redness, swelling, asymmetry, or associated skin lesions. No contractures.   Skin & Extremity Inspection: Skin color, temperature, and hair growth are WNL. No peripheral edema or cyanosis. No masses, redness, swelling, asymmetry, or associated skin lesions. No contractures.  Functional ROM: Decreased ROM for hip and knee joints           Functional ROM: Decreased ROM for hip and knee joints, pain with weight bearing and lateral rotation          Muscle Tone/Strength: Functionally intact. No obvious neuro-muscular anomalies detected.   Muscle Tone/Strength: Functionally intact. No obvious neuro-muscular anomalies detected.  Sensory (Neurological): Neurogenic pain pattern        Sensory (Neurological):  Arthropathic arthralgia for left knee severe pain with weightbearing        DTR: Patellar: deferred today Achilles: deferred today Plantar: deferred today   DTR: Patellar: deferred today Achilles: deferred today Plantar: deferred today  Palpation: No palpable anomalies   Palpation: No palpable anomalies    Assessment   Status Diagnosis  Persistent Persistent Persistent 1. Diabetic peripheral neuropathy (HCC)   2. Chronic bilateral thoracic back pain   3. Failed back surgical syndrome   4. Neurogenic pain   5. Pain management contract signed   6. May-Thurner syndrome   7. Chronic pain syndrome   8. Chronic musculoskeletal pain          Plan of Care  [name] has a current medication list which includes the following long-term medication(s): amphetamine-dextroamphetamine, amphetamine-dextroamphetamine, amphetamine-dextroamphetamine, atorvastatin, cetirizine, fluoxetine, levothyroxine, linaclotide, pantoprazole, potassium chloride,  pregabalin, ropinirole, trazodone, and warfarin.  Pharmacotherapy (Medications Ordered): Meds ordered this  encounter  Medications   HYDROcodone-acetaminophen (NORCO) 10-325 MG tablet    Sig: Take 1 tablet by mouth every 6 (six) hours as needed. To last 30 days from fill date    Dispense:  120 tablet    Refill:  0   HYDROcodone-acetaminophen (NORCO) 10-325 MG tablet    Sig: Take 1 tablet by mouth every 6 (six) hours as needed. To last 30 days from fill date    Dispense:  120 tablet    Refill:  0   HYDROcodone-acetaminophen (NORCO) 10-325 MG tablet    Sig: Take 1 tablet by mouth every 6 (six) hours as needed. To last 30 days from fill date    Dispense:  120 tablet    Refill:  0   Continue Lyrica as prescribed Continue with SCS  Orders Placed This Encounter  Procedures   NEUROLYSIS    Please order Qutenza patches from pharmacy    Standing Status:   Future    Standing Expiration Date:   09/17/2023    Order Specific Question:   Where will this procedure be performed?    Answer:   [facility]   [employer] Select 13 (MW), Urine    Volume: 30 ml(s). Minimum 3 ml of urine is needed. Document temperature of fresh sample. Indications: Long term (current) use of opiate analgesic (Z61.096)    Order Specific Question:   Release to patient    Answer:   Immediate    Follow-up plan:   Return in about 22 days (around 07/09/2023) for Qutenza.   Recent Visits Date Type Provider Dept  04/02/23 Procedure visit Jane Jolly, MD [facility]  Showing recent visits within past 90 days and meeting all other requirements Today's Visits Date Type Provider Dept  06/17/23 Office Visit Jane Jolly, MD [facility]  Showing today's visits and meeting all other requirements Future Appointments Date Type Provider Dept  09/11/23 Appointment Jane Jolly, MD [facility]  Showing future appointments within next 90 days and meeting all other requirements  I  discussed the assessment and treatment plan with the patient. The patient was provided an opportunity to ask questions and all were answered. The patient agreed with the plan and demonstrated an understanding of the instructions.  Patient advised to call back or seek an in-person evaluation if the symptoms or condition worsens.  Duration of encounter: .  Note by: Jane Jolly, MD Date: 06/17/2023; Time: 1:23 PM

## 2023-07-01 ENCOUNTER — Encounter (INDEPENDENT_AMBULATORY_CARE_PROVIDER_SITE_OTHER): Payer: Self-pay | Admitting: Family Medicine

## 2023-07-01 ENCOUNTER — Other Ambulatory Visit: Payer: Self-pay | Admitting: Family Medicine

## 2023-07-01 ENCOUNTER — Ambulatory Visit (INDEPENDENT_AMBULATORY_CARE_PROVIDER_SITE_OTHER): Payer: MEDICAID | Admitting: Family Medicine

## 2023-07-01 ENCOUNTER — Other Ambulatory Visit (HOSPITAL_COMMUNITY): Payer: Self-pay | Admitting: Psychiatry

## 2023-07-01 VITALS — BP 103/66 | HR 64 | Temp 98.1°F | Ht 68.0 in | Wt 222.0 lb

## 2023-07-01 DIAGNOSIS — E1169 Type 2 diabetes mellitus with other specified complication: Secondary | ICD-10-CM

## 2023-07-01 DIAGNOSIS — Z6833 Body mass index (BMI) 33.0-33.9, adult: Secondary | ICD-10-CM

## 2023-07-01 DIAGNOSIS — E785 Hyperlipidemia, unspecified: Secondary | ICD-10-CM

## 2023-07-01 DIAGNOSIS — E669 Obesity, unspecified: Secondary | ICD-10-CM

## 2023-07-01 DIAGNOSIS — R296 Repeated falls: Secondary | ICD-10-CM

## 2023-07-01 DIAGNOSIS — E1159 Type 2 diabetes mellitus with other circulatory complications: Secondary | ICD-10-CM

## 2023-07-01 DIAGNOSIS — E1165 Type 2 diabetes mellitus with hyperglycemia: Secondary | ICD-10-CM | POA: Diagnosis not present

## 2023-07-01 DIAGNOSIS — Z7985 Long-term (current) use of injectable non-insulin antidiabetic drugs: Secondary | ICD-10-CM

## 2023-07-01 DIAGNOSIS — I152 Hypertension secondary to endocrine disorders: Secondary | ICD-10-CM

## 2023-07-01 NOTE — Progress Notes (Signed)
Chief Complaint:   OBESITY Jane [name] is here to discuss her progress with her obesity treatment plan along with follow-up of her obesity related diagnoses. Jane [name] is on the Category 3 Plan and states she is following her eating plan approximately 75% of the time. Jane [name] states she is doing 0 minutes 0 times per week.  Today's visit was #: 41 Starting weight: 283 lbs Starting date: 03/29/2020 Today's weight: 222 lbs Today's date: 07/01/2023 Total lbs lost to date: 61 Total lbs lost since last in-office visit: 2  Interim History: Patient has had numerous falls recently- first was in the shower she slipped and last time was when she had a fever. She is working with setting boundaries with her family.  She is working on staying adherent to Category 3 plan. Patient is having company this weekend and going to see a movie and go to do crafts.  She is going to [facility] for her [facility] at the end of next month.    Subjective:   1. Type 2 diabetes mellitus with hyperglycemia, without long-term current use of insulin (HCC) Patient is on Ozempic with no GI side effects.  Her last [name] was 5.7.  2. Hyperlipidemia associated with type 2 diabetes mellitus (HCC) Patient is on Lipitor daily, and she denies myalgias.  3. Recurrent falls Patient has had 2 significant falls in the last month.  She has impressive attack ecchymosis on her left knee and elbow.  She is on a blood thinner.  Assessment/Plan:   1. Type 2 diabetes mellitus with hyperglycemia, without long-term current use of insulin (HCC) Patient will continue Ozempic (she has 2 months).  2. Hyperlipidemia associated with type 2 diabetes mellitus (HCC) Patient will continue Lipitor with no change in medication or dose.  3. Recurrent falls Patient was encouraged to go to the emergency department if she were to fall and hit her head, and she is on a blood thinner.  4. BMI 33.0-33.9,adult  5. Obesity with starting BMI of  43.0 Jane [name] is currently in the action stage of change. As such, her goal is to continue with weight loss efforts. She has agreed to the Category 3 Plan.   Exercise goals: All adults should avoid inactivity. Some physical activity is better than none, and adults who participate in any amount of physical activity gain some health benefits.  Patient is to start chair exercises at least 3 times per week.  Behavioral modification strategies: increasing lean protein intake, meal planning and cooking strategies, keeping healthy foods in the home, and planning for success.  Dyan has agreed to follow-up with our clinic in 6 weeks. She was informed of the importance of frequent follow-up visits to maximize her success with intensive lifestyle modifications for her multiple health conditions.   Objective:   Blood pressure 103/66, pulse 64, temperature 98.1 F (36.7 C), height 5\' 8"  (1.727 m), weight 222 lb (100.7 kg), SpO2 97%. Body mass index is 33.75 kg/m.  General: Cooperative, alert, well developed, in no acute distress. HEENT: Conjunctivae and lids unremarkable. Cardiovascular: Regular rhythm.  Lungs: Normal work of breathing. Neurologic: No focal deficits.   Lab Results  Component Value Date   CREATININE 1.27 (H) 05/09/2023   BUN 25 (H) 05/09/2023   NA 142 05/09/2023   K 4.7 05/09/2023   CL 103 05/09/2023   CO2 23 05/09/2023   Lab Results  Component Value Date   ALT 16 05/09/2023   AST 17 05/09/2023   ALKPHOS 79  05/09/2023   BILITOT 0.3 05/09/2023   Lab Results  Component Value Date   HGBA1C 5.7 (H) 05/09/2023   HGBA1C 5.9 (H) 11/07/2022   HGBA1C 5.8 (A) 04/22/2022   HGBA1C 6.2 (H) 11/06/2021   HGBA1C 6.3 (H) 03/22/2021   Lab Results  Component Value Date   INSULIN 21.5 03/29/2020   Lab Results  Component Value Date   TSH 1.440 11/07/2022   Lab Results  Component Value Date   CHOL 177 05/09/2023   HDL 47 05/09/2023   LDLCALC 109 (H) 05/09/2023   TRIG 115  05/09/2023   CHOLHDL 3.8 05/09/2023   Lab Results  Component Value Date   VD25OH 48.8 03/14/2023   VD25OH 47.10 02/21/2022   VD25OH 50.60 01/01/2021   Lab Results  Component Value Date   WBC 4.7 05/09/2023   HGB 12.1 05/09/2023   HCT 36.6 05/09/2023   MCV 92 05/09/2023   PLT 114 (L) 05/09/2023   Lab Results  Component Value Date   IRON 93 02/24/2023   TIBC 322 02/24/2023   FERRITIN 214 02/24/2023   Attestation Statements:   Reviewed by clinician on day of visit: allergies, medications, problem list, medical history, surgical history, family history, social history, and previous encounter notes.   I, Burt Knack, am acting as transcriptionist for Reuben Likes, MD.  I have reviewed the above documentation for accuracy and completeness, and I agree with the above. - Reuben Likes, MD

## 2023-07-02 ENCOUNTER — Ambulatory Visit (INDEPENDENT_AMBULATORY_CARE_PROVIDER_SITE_OTHER)
Admission: RE | Admit: 2023-07-02 | Discharge: 2023-07-02 | Discharge status: Home / Self Care | Payer: MEDICAID | Source: Ambulatory Visit | Attending: Surgery | Admitting: Surgery

## 2023-07-02 ENCOUNTER — Ambulatory Visit (HOSPITAL_COMMUNITY)
Admission: RE | Admit: 2023-07-02 | Discharge: 2023-07-02 | Disposition: A | Discharge status: Home / Self Care | Payer: MEDICAID | Source: Ambulatory Visit | Attending: Surgery | Admitting: Surgery

## 2023-07-02 ENCOUNTER — Ambulatory Visit (INDEPENDENT_AMBULATORY_CARE_PROVIDER_SITE_OTHER): Payer: MEDICAID | Admitting: Vascular Surgery

## 2023-07-02 ENCOUNTER — Ambulatory Visit: Payer: MEDICAID | Admitting: Family Medicine

## 2023-07-02 ENCOUNTER — Encounter: Payer: Self-pay | Admitting: Vascular Surgery

## 2023-07-02 VITALS — BP 136/73 | HR 55 | Temp 98.3°F | Resp 18 | Ht 68.0 in | Wt 222.0 lb

## 2023-07-02 DIAGNOSIS — I871 Compression of vein: Secondary | ICD-10-CM | POA: Insufficient documentation

## 2023-07-02 DIAGNOSIS — I87002 Postthrombotic syndrome without complications of left lower extremity: Secondary | ICD-10-CM | POA: Diagnosis not present

## 2023-07-02 NOTE — Progress Notes (Signed)
Patient ID: Jane [name], female   DOB: [birth year], 57 y.o.   MRN: [id number]  Reason for Consult: Varicose Veins (F/u VV)   Referred by Erasmo Downer, MD  Subjective:     HPI:  Jane [name] is a 57 y.o. 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  She now remains on Coumadin.  Patient has been followed in the healthy weight and wellness clinic and has lost over 60 pounds.  She is not compliant with compression stockings.  She recently has 2 falls with bruising of the right shoulder and the left thigh and knee and also recently had a sunburn to both of her legs while floating the [facility].  She is planning to go to [facility] to celebrate her birthday.  Past Medical History:  Diagnosis Date   ADD (attention deficit disorder)    Anxiety    Back pain    Bilateral swelling of feet    Bipolar 1 disorder (HCC)    Bipolar 1 disorder (HCC)    Bipolar disorder (HCC)    Chewing difficulty    Chronic fatigue syndrome    Chronic kidney disease    Stage 3 kidney disease;dx by Dr. Kari Baars.    Constipation    Depression    Diabetes mellitus without complication (HCC)    diet controlled   Dyspnea    with exertion   GERD (gastroesophageal reflux disease)    Headache    migraines   High cholesterol    History of blood clots    History of DVT (deep vein thrombosis)    left leg   Hypertension    states under control with meds., has been on med. x 2 years   Hypothyroidism    Joint pain    Neuropathy    Obsessive-compulsive disorder    Peripheral vascular disease (HCC)    Prediabetes    Respiratory failure requiring intubation (HCC)    Restless leg syndrome    Shortness of breath    Sleep apnea    Thrombocytopenia (HCC) 09/15/2019   Trigger  thumb of left hand 01/2018   Trigger thumb of right hand    Family History  Problem Relation Age of Onset   Heart disease Mother    Hyperlipidemia Mother    Hypertension Mother    Bipolar disorder Mother    Stroke Mother    Depression Mother    Sleep apnea Mother    Obesity Mother    Diabetes Father    Heart disease Father    Hyperlipidemia Father    Hypertension Father    Sleep apnea Father    Obesity Father    Drug abuse Daughter    ADD / ADHD Daughter    Drug abuse Daughter    Anxiety disorder Daughter    Bipolar disorder Daughter    Hypertension Sister    Hypertension Brother    Hyperlipidemia Brother    Heart disease Brother    Bipolar disorder Maternal Aunt    Suicidality Maternal Aunt    Past Surgical History:  Procedure Laterality Date   ABDOMINAL HYSTERECTOMY  06/2016   complete   APPLICATION OF WOUND VAC Left 04/27/2019   Procedure: APPLICATION OF WOUND VAC LEFT GROIN;  Surgeon: Maeola Harman, MD;  Location: [facility]  OR;  Service: Vascular;  Laterality: Left;   AV FISTULA PLACEMENT Left 11/24/2018   Procedure: ARTERIOVENOUS (AV) FISTULA CREATION LEFT SFA TO LEFT FEMORAL VEIN;  Surgeon: Maeola Harman, MD;  Location: [facility] OR;  Service: Vascular;  Laterality: Left;   BACK SURGERY     CHOLECYSTECTOMY     COLONOSCOPY WITH PROPOFOL N/A 11/22/2019   Procedure: COLONOSCOPY WITH PROPOFOL;  Surgeon: Wyline Mood, MD;  Location: [facility] ENDOSCOPY;  Service: Gastroenterology;  Laterality: N/A;   COLONOSCOPY WITH PROPOFOL N/A 12/09/2022   Procedure: COLONOSCOPY WITH PROPOFOL;  Surgeon: Wyline Mood, MD;  Location: [facility] ENDOSCOPY;  Service: Gastroenterology;  Laterality: N/A;   FEMORAL ARTERY EXPLORATION  03/30/2019   Procedure: Left Common Femoral Artery and Vein Exploration;  Surgeon: Maeola Harman, MD;  Location: [facility] OR;  Service: Vascular;;   FEMORAL-FEMORAL BYPASS GRAFT Left 11/24/2018   Procedure: BYPASS GRAFT FEMORAL-FEMORAL VENOUS LEFT TO RIGHT PALMA  PROCEDURE USING CRYOVEIN;  Surgeon: Maeola Harman, MD;  Location: [facility] OR;  Service: Vascular;  Laterality: Left;   GROIN DEBRIDEMENT Left 04/27/2019   Procedure: GROIN DEBRIDEMENT;  Surgeon: Maeola Harman, MD;  Location: [facility] OR;  Service: Vascular;  Laterality: Left;   INSERTION OF ILIAC STENT  03/30/2019   Procedure: Stent of left common, external iliac veins and left common femoral vein;  Surgeon: Maeola Harman, MD;  Location: [facility] OR;  Service: Vascular;;   KNEE ARTHROSCOPY WITH MENISCAL REPAIR Left 11/14/2020   Procedure: LEFT KNEE ARTHROSCOPY WITH PARTIAL MEDIAL MENISCECTOMY;  Surgeon: Vickki Hearing, MD;  Location: [facility] ORS;  Service: Orthopedics;  Laterality: Left;   LOWER EXTREMITY VENOGRAPHY N/A 08/17/2018   Procedure: LOWER EXTREMITY VENOGRAPHY - Central Venogram;  Surgeon: Maeola Harman, MD;  Location: [facility] INVASIVE CV LAB;  Service: Cardiovascular;  Laterality: N/A;   LOWER EXTREMITY VENOGRAPHY Bilateral 03/09/2019   Procedure: LOWER EXTREMITY VENOGRAPHY;  Surgeon: Maeola Harman, MD;  Location: [facility] INVASIVE CV LAB;  Service: Cardiovascular;  Laterality: Bilateral;   LOWER EXTREMITY VENOGRAPHY Left 08/16/2019   Procedure: LOWER EXTREMITY VENOGRAPHY;  Surgeon: Maeola Harman, MD;  Location: [facility] INVASIVE CV LAB;  Service: Cardiovascular;  Laterality: Left;   LOWER EXTREMITY VENOGRAPHY Left 03/25/2022   Procedure: LOWER EXTREMITY VENOGRAPHY;  Surgeon: Maeola Harman, MD;  Location: [facility] INVASIVE CV LAB;  Service: Cardiovascular;  Laterality: Left;  IVUS   LUMBAR FUSION  11/21/2000   L5-S1   LUMBAR SPINE SURGERY     x 2 others   PATCH ANGIOPLASTY Left 03/30/2019   Procedure: Patch Angioplasty of the Left Common Femoral Vein using Venosure Biologic patch;  Surgeon: Maeola Harman, MD;  Location: [facility] OR;  Service: Vascular;  Laterality: Left;   PERIPHERAL VASCULAR INTERVENTION Left 08/16/2019   Procedure:  PERIPHERAL VASCULAR INTERVENTION;  Surgeon: Maeola Harman, MD;  Location: [facility] INVASIVE CV LAB;  Service: Cardiovascular;  Laterality: Left;  common femoral/femoral vein stent   PERIPHERAL VASCULAR INTERVENTION Left 03/25/2022   Procedure: PERIPHERAL VASCULAR INTERVENTION;  Surgeon: Maeola Harman, MD;  Location: [facility] INVASIVE CV LAB;  Service: Cardiovascular;  Laterality: Left;  COMMON FEMORAL VEIN   PERIPHERAL VASCULAR THROMBECTOMY Left 03/25/2022   Procedure: PERIPHERAL VASCULAR THROMBECTOMY;  Surgeon: Maeola Harman, MD;  Location: [facility] INVASIVE CV LAB;  Service: Cardiovascular;  Laterality: Left;  EXTREMITY/IVC   TRIGGER FINGER RELEASE Right 12/01/2017   Procedure: RELEASE TRIGGER FINGER/A-1 PULLEY RIGHT THUMB;  Surgeon: Betha Loa, MD;  Location: [facility];  Service: Orthopedics;  Laterality:  Right;   TRIGGER FINGER RELEASE Left 01/26/2018   Procedure: LEFT TRIGGER THUMB RELEASE;  Surgeon: Betha Loa, MD;  Location: [facility];  Service: Orthopedics;  Laterality: Left;   ULTRASOUND GUIDANCE FOR VASCULAR ACCESS Right 03/30/2019   Procedure: Ultrasound-guided cannulation right internal jugular vein;  Surgeon: Maeola Harman, MD;  Location: [facility] OR;  Service: Vascular;  Laterality: Right;    Short Social History:  Social History   Tobacco Use   Smoking status: Never   Smokeless tobacco: Never  Substance Use Topics   Alcohol use: No    Allergies  Allergen Reactions   Aripiprazole Other (See Comments)    BECOMES  VIOLENT    Seroquel [Quetiapine Fumarate] Other (See Comments)    BECOMES VIOLENT   Chlorpromazine Other (See Comments)    SEVERE ANXIETY    Gabapentin Other (See Comments)    NIGHTMARES    Prednisone Hives   Quetiapine     Current Outpatient Medications  Medication Sig Dispense Refill   ALPRAZolam (XANAX) 1 MG tablet Take 1 tablet (1 mg total) by mouth 2 (two) times daily as needed. for anxiety 60  tablet 2   amphetamine-dextroamphetamine (ADDERALL) 30 MG tablet Take 1 tablet by mouth 2 (two) times daily. 60 tablet 0   amphetamine-dextroamphetamine (ADDERALL) 30 MG tablet Take 1 tablet by mouth 2 (two) times daily. 60 tablet 0   amphetamine-dextroamphetamine (ADDERALL) 30 MG tablet Take 1 tablet by mouth 2 (two) times daily. 60 tablet 0   aspirin EC 81 MG tablet Take 81 mg by mouth daily.     atorvastatin (LIPITOR) 40 MG tablet Take 1 tablet (40 mg total) by mouth daily. 90 tablet 3   cetirizine (ZYRTEC) 10 MG tablet Take 1 tablet (10 mg total) by mouth daily. 90 tablet 3   estradiol (ESTRACE VAGINAL) 0.1 MG/GM vaginal cream Apply 0.5mg  (pea-sized amount)  just inside the vaginal introitus with a finger-tip on Monday, Wednesday and Friday nights. 30 g 12   famotidine (PEPCID) 20 MG tablet Take 20 mg by mouth daily.     FLUoxetine (PROZAC) 40 MG capsule Take 1 capsule (40 mg total) by mouth 2 (two) times daily. 180 capsule 2   GARLIC PO Take 1 tablet by mouth daily.     HYDROcodone-acetaminophen (NORCO) 10-325 MG tablet Take 1 tablet by mouth every 6 (six) hours as needed. To last 30 days from fill date 120 tablet 0   [START ON 07/24/2023] HYDROcodone-acetaminophen (NORCO) 10-325 MG tablet Take 1 tablet by mouth every 6 (six) hours as needed. To last 30 days from fill date 120 tablet 0   [START ON 08/23/2023] HYDROcodone-acetaminophen (NORCO) 10-325 MG tablet Take 1 tablet by mouth every 6 (six) hours as needed. To last 30 days from fill date 120 tablet 0   ketoconazole (NIZORAL) 2 % shampoo Apply topically daily.     levothyroxine (SYNTHROID) 125 MCG tablet Take 1 tablet (125 mcg total) by mouth daily before breakfast. 90 tablet 3   linaclotide (LINZESS) 145 MCG CAPS capsule Take 1 capsule (145 mcg total) by mouth daily. 90 capsule 3   methocarbamol (ROBAXIN) 750 MG tablet Take 1 tablet (750 mg total) by mouth 2 (two) times daily as needed for muscle spasms. 60 tablet 5   Multiple Vitamin  (MULTIVITAMIN) capsule Take 1 capsule by mouth daily.     omega-3 acid ethyl esters (LOVAZA) 1 g capsule Take by mouth.     pantoprazole (PROTONIX) 40 MG tablet Take 1 tablet (  40 mg total) by mouth daily. 90 tablet 3   polyethylene glycol powder (GLYCOLAX/MIRALAX) 17 GM/SCOOP powder Take 17 g by mouth daily. 850 g 1   potassium chloride (KLOR-CON M) 10 MEQ tablet Take 1 tablet (10 mEq total) by mouth 3 (three) times daily. 270 tablet 3   pregabalin (LYRICA) 100 MG capsule Take 1 capsule (100 mg total) by mouth 4 (four) times daily. 120 capsule 5   rOPINIRole (REQUIP) 2 MG tablet Take 1 tablet (2 mg total) by mouth 2 (two) times daily. 180 tablet 1   Semaglutide, 1 MG/DOSE, 4 MG/3ML SOPN Inject 1 mg as directed once a week. 9 mL 0   traZODone (DESYREL) 150 MG tablet Take 1 tablet (150 mg total) by mouth at bedtime. 30 tablet 2   valACYclovir (VALTREX) 1000 MG tablet Take 2 tablets by mouth at first onset of fever blisters, then take 2 tablets by mouth 12 hours later. 30 tablet 3   Vibegron (GEMTESA) 75 MG TABS Take 1 tablet (75 mg total) by mouth daily. 90 tablet 3   warfarin (COUMADIN) 5 MG tablet Take 1 tablet (5 mg total) by mouth daily. 90 tablet 3   No current facility-administered medications for this visit.    Review of Systems  Constitutional:  Constitutional negative. HENT: HENT negative.  Eyes: Eyes negative.  Respiratory: Respiratory negative.  Cardiovascular: Positive for leg swelling.  GI: Gastrointestinal negative.  Musculoskeletal: Positive for leg pain.  Skin: Skin negative.  Neurological: Neurological negative. Hematologic: Positive for bruises/bleeds easily.  Psychiatric: Psychiatric negative.        Objective:  Objective   Vitals:   07/02/23 1015  BP: 136/73  Pulse: (!) 55  Resp: 18  Temp: 98.3 F (36.8 C)  TempSrc: Temporal  SpO2: 96%  Weight: 222 lb (100.7 kg)  Height: 5\' 8"  (1.727 m)   Body mass index is 33.75 kg/m.  Physical Exam HENT:      Head: Normocephalic.     Nose: Nose normal.     Mouth/Throat:     Mouth: Mucous membranes are moist.  Eyes:     Pupils: Pupils are equal, round, and reactive to light.  Pulmonary:     Effort: Pulmonary effort is normal.  Abdominal:     General: Abdomen is flat.     Palpations: Abdomen is soft.  Musculoskeletal:     Left lower leg: Edema present.     Comments: Right leg measures 36 cm, left leg measures 38.5 cm (measured 10 cm from tibial tuberosities)  Skin:    Comments: Bruising of right shoulder and left thigh and knee  Neurological:     Mental Status: She is alert.     Data: RIGHTCompressibilityPhasicitySpontaneityPropertiesThrombus Aging  +-----+---------------+---------+-----------+----------+--------------+  CFV Full           Yes      Yes                                  +-----+---------------+---------+-----------+----------+--------------+         +---------+---------------+---------+-----------+---------------+----------  ----+  LEFT    CompressibilityPhasicitySpontaneityProperties     Thrombus  Aging  +---------+---------------+---------+-----------+---------------+----------  ----+  CFV     None           No       No         occluded stent Chronic          +---------+---------------+---------+-----------+---------------+----------  ----+  FV  Prox  None           No       No         retracted      Chronic          +---------+---------------+---------+-----------+---------------+----------  ----+  FV Mid   Full           Yes      Yes        retrograde flow                                                             with                                                                        augmentation                    +---------+---------------+---------+-----------+---------------+----------  ----+  FV DistalFull           Yes      Yes                                         +---------+---------------+---------+-----------+---------------+----------  ----+  PFV     None           No       No         retracted      Chronic          +---------+---------------+---------+-----------+---------------+----------  ----+  POP     Full           Yes      Yes        duplicated vein                 +---------+---------------+---------+-----------+---------------+----------  ----+  PTV     Full           Yes                 venous reflux                   +---------+---------------+---------+-----------+---------------+----------  ----+  PERO    Full           Yes                                                 +---------+---------------+---------+-----------+---------------+----------  ----+  PFV     Partial        Yes      Yes        rigid          Chronic          accessory  w/compression                   +---------+---------------+---------+-----------+---------------+----------  ----+        Left Technical Findings:  Not visualized segments include Left SFJ.       Summary:  RIGHT:  - No evidence of common femoral vein obstruction.         LEFT:     - Findings consistent with chronic deep vein thrombosis involving the left  common femoral vein, left femoral vein, and left proximal profunda vein.    - Left common femoral vein stent appears occluded with chronically  occluded proximal FV and proximal profunda femoral vein.      Assessment/Plan:    57 year old female with extensive venous history as above.  Vein stents are noted to be occluded but her left lower extremity today is smaller than I have ever seen at and measured only 38.5 cm.  I have recommended compression stockings although she is usually noncompliant.  She will need to wear these or at least keep her leg elevated after she has her hammertoe fixed in October to prevent worsening edema.  She does have an  ABI in the mildly depressed range but her toe pressure is preserved and should be satisfactory for healing.  She will follow-up in 1 year with repeat duplex unless she has issues prior.     Maeola Harman MD [facility]

## 2023-07-09 ENCOUNTER — Encounter: Payer: Self-pay | Admitting: Student in an Organized Health Care Education/Training Program

## 2023-07-09 ENCOUNTER — Ambulatory Visit (INDEPENDENT_AMBULATORY_CARE_PROVIDER_SITE_OTHER): Payer: MEDICAID

## 2023-07-09 ENCOUNTER — Ambulatory Visit
Payer: MEDICAID | Attending: Student in an Organized Health Care Education/Training Program | Admitting: Student in an Organized Health Care Education/Training Program

## 2023-07-09 VITALS — BP 154/83 | HR 54 | Temp 97.9°F | Resp 16 | Ht 70.0 in | Wt 222.0 lb

## 2023-07-09 DIAGNOSIS — Z7901 Long term (current) use of anticoagulants: Secondary | ICD-10-CM

## 2023-07-09 DIAGNOSIS — E1142 Type 2 diabetes mellitus with diabetic polyneuropathy: Secondary | ICD-10-CM | POA: Diagnosis present

## 2023-07-09 LAB — POCT INR
INR: 2.9 (ref 2.0–3.0)
PT: 35

## 2023-07-09 MED ORDER — CAPSAICIN-CLEANSING GEL 8 % EX KIT
4.0000 | PACK | Freq: Once | CUTANEOUS | Status: AC
  Administered 2023-07-09: 4 via TOPICAL
  Filled 2023-07-09: qty 4

## 2023-07-09 NOTE — Progress Notes (Signed)
Safety precautions to be maintained throughout the outpatient stay will include: orient to surroundings, keep bed in low position, maintain call bell within reach at all times, provide assistance with transfer out of bed and ambulation.  

## 2023-07-09 NOTE — Patient Instructions (Signed)
Description   7mg  every Wednesday and Saturday 5 mg daily. F/U in 4 weeks. (Per patient she has been on 7.5mg  Wednesday and Saturday and 5 mg daily for the past 6 weeks since it was change here).

## 2023-07-09 NOTE — Progress Notes (Signed)
PROVIDER NOTE: Interpretation of information contained herein should be left to medically-trained personnel. Specific patient instructions are provided elsewhere under "Patient Instructions" section of medical record. This document was created in part using STT-dictation technology, any transcriptional errors that may result from this process are unintentional.  Patient: Jane [name] Type: Established DOB: Nov 07, 1965 MRN: [id number] PCP: Erasmo Downer, MD  Service: Procedure DOS: 07/09/2023 Setting: Ambulatory Location: Ambulatory outpatient facility Delivery: Face-to-face Provider: Edward Jolly, MD Specialty: Interventional Pain Management Specialty designation: 09 Location: Outpatient facility Ref. Prov.: Bacigalupo, Marzella Schlein, MD       Interventional Therapy   Interventional Treatment:           Type: Qutenza Neurolysis #2  Laterality:  Bilateral Area treated: Feet Imaging Guidance: None Anesthesia/analgesia/anxiolysis/sedation: None required Medication (Right): Qutenza (capsaicin 8%) topical system Medication (Left): Qutenza (capsaicin 8%) topical system Date: 07/09/2023 Performed by: Edward Jolly, MD Rationale (medical necessity): procedure needed and proper for the treatment of Jane [name]'s medical symptoms and needs. 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 1. Diabetic peripheral neuropathy (HCC)     NAS-11 Pain score:   Pre-procedure: 7 /10   Post-procedure: 7 /10     Position / Prep / Materials:  Position: Supine  Materials: Qutenza Kit  Pre-op H&P Assessment:  Jane [name] is a 57 y.o. (year old), female patient, seen today for interventional treatment. She  has a past surgical history that includes Cholecystectomy; Trigger finger release (Right, 12/01/2017); Abdominal hysterectomy (06/2016); Lumbar fusion (11/21/2000); Lumbar spine surgery; Trigger finger release (Left,  01/26/2018); LOWER EXTREMITY VENOGRAPHY (N/A, 08/17/2018); Femoral-femoral Bypass Graft (Left, 11/24/2018); AV fistula placement (Left, 11/24/2018); LOWER EXTREMITY VENOGRAPHY (Bilateral, 03/09/2019); Patch angioplasty (Left, 03/30/2019); Ultrasound guidance for vascular access (Right, 03/30/2019); Femoral artery debridement (03/30/2019); Insertion of iliac stent (03/30/2019); Groin debridement (Left, 04/27/2019); Application if wound vac (Left, 04/27/2019); LOWER EXTREMITY VENOGRAPHY (Left, 08/16/2019); PERIPHERAL VASCULAR INTERVENTION (Left, 08/16/2019); Back surgery; Colonoscopy with propofol (N/A, 11/22/2019); Knee arthroscopy with meniscal repair (Left, 11/14/2020); LOWER EXTREMITY VENOGRAPHY (Left, 03/25/2022); PERIPHERAL VASCULAR THROMBECTOMY (Left, 03/25/2022); PERIPHERAL VASCULAR INTERVENTION (Left, 03/25/2022); and Colonoscopy with propofol (N/A, 12/09/2022). Jane [name] has a current medication list which includes the following prescription(s): alprazolam, amphetamine-dextroamphetamine, amphetamine-dextroamphetamine, amphetamine-dextroamphetamine, aspirin ec, atorvastatin, cetirizine, estradiol, famotidine, fluoxetine, garlic, hydrocodone-acetaminophen, [START ON 07/24/2023] hydrocodone-acetaminophen, [START ON 08/23/2023] hydrocodone-acetaminophen, ketoconazole, levothyroxine, linaclotide, methocarbamol, multivitamin, omega-3 acid ethyl esters, pantoprazole, polyethylene glycol powder, potassium chloride, pregabalin, ropinirole, semaglutide (1 mg/dose), trazodone, valacyclovir, gemtesa, and warfarin. Her primarily concern today is the Foot Pain (bilat)  Initial Vital Signs:  Pulse/HCG Rate: (!) 53  Temp: 97.9 F (36.6 C) Resp: 16 BP: 135/71 SpO2: 99 %  BMI: Estimated body mass index is 31.85 kg/m as calculated from the following:   Height as of this encounter: 5\' 10"  (1.778 m).   Weight as of this encounter: 222 lb (100.7 kg).  Risk Assessment: Allergies: Reviewed. She is allergic to aripiprazole,  seroquel [quetiapine fumarate], chlorpromazine, gabapentin, prednisone, and quetiapine.  Allergy Precautions: None required Coagulopathies: Reviewed. None identified.  Blood-thinner therapy: None at this time Active Infection(s): Reviewed. None identified. Jane [name] is afebrile  Site Confirmation: Jane [name] was asked to confirm the procedure and laterality before marking the site Procedure checklist: Completed Consent: Before the procedure and under the influence of no sedative(s), amnesic(s), or anxiolytics, the patient was informed of the treatment options, risks and possible complications. To fulfill our ethical and legal obligations, as recommended by the American Medical Association's Code  of Ethics, I have informed the patient of my clinical impression; the nature and purpose of the treatment or procedure; the risks, benefits, and possible complications of the intervention; the alternatives, including doing nothing; the risk(s) and benefit(s) of the alternative treatment(s) or procedure(s); and the risk(s) and benefit(s) of doing nothing. The patient was provided information about the general risks and possible complications associated with the procedure. These may include, but are not limited to: failure to achieve desired goals, infection, bleeding, organ or nerve damage, allergic reactions, paralysis, and death. In addition, the patient was informed of those risks and complications associated to the procedure, such as failure to decrease pain; infection; bleeding; organ or nerve damage with subsequent damage to sensory, motor, and/or autonomic systems, resulting in permanent pain, numbness, and/or weakness of one or several areas of the body; allergic reactions; (i.e.: anaphylactic reaction); and/or death. Furthermore, the patient was informed of those risks and complications associated with the medications. These include, but are not limited to: allergic reactions (i.e.: anaphylactic or  anaphylactoid reaction(s)); adrenal axis suppression; blood sugar elevation that in diabetics may result in ketoacidosis or comma; water retention that in patients with history of congestive heart failure may result in shortness of breath, pulmonary edema, and decompensation with resultant heart failure; weight gain; swelling or edema; medication-induced neural toxicity; particulate matter embolism and blood vessel occlusion with resultant organ, and/or nervous system infarction; and/or aseptic necrosis of one or more joints. Finally, the patient was informed that Medicine is not an exact science; therefore, there is also the possibility of unforeseen or unpredictable risks and/or possible complications that may result in a catastrophic outcome. The patient indicated having understood very clearly. We have given the patient no guarantees and we have made no promises. Enough time was given to the patient to ask questions, all of which were answered to the patient's satisfaction. Jane [name] has indicated that she wanted to continue with the procedure. Attestation: I, the ordering provider, attest that I have discussed with the patient the benefits, risks, side-effects, alternatives, likelihood of achieving goals, and potential problems during recovery for the procedure that I have provided informed consent. Date  Time: 07/09/2023 11:24 AM  Pre-Procedure Preparation:  Monitoring: As per clinic protocol. Respiration, ETCO2, SpO2, BP, heart rate and rhythm monitor placed and checked for adequate function Safety Precautions: Patient was assessed for positional comfort and pressure points before starting the procedure. Time-out: I initiated and conducted the "Time-out" before starting the procedure, as per protocol. The patient was asked to participate by confirming the accuracy of the "Time Out" information. Verification of the correct person, site, and procedure were performed and confirmed by me, the nursing  staff, and the patient. "Time-out" conducted as per Joint Commission's Universal Protocol (UP.01.01.01). Time: 1038 Start Time: 1149 hrs.  Description/Narrative of Procedure:          Region: Distal lower extremity Target Area: Sensory peripheral nerves affected by diabetic peripheral neuropathy Site: Feet Approach: Percutaneous  No./Series: Not applicable  Type: Percutaneous  Purpose: Therapeutic  Region: Distal lower extremities  Start Time: 1149 hrs.  Description of the Procedure: Protocol guidelines were followed. The patient was assisted into a comfortable position.  Informed consent was obtained in the patient monitored in the usual manner.  All questions were answered prior to the procedure.  They Qutenza patches were applied to the affected area and then covered with the wrap.  The Patient was kept under observation until the treatment was completed.  The patches  were removed and the treated area was inspected.  Vitals:   07/09/23 1142 07/09/23 1243  BP: 135/71 (!) 154/83  Pulse: (!) 53 (!) 54  Resp: 16 16  Temp: 97.9 F (36.6 C) 97.9 F (36.6 C)  SpO2: 99%   Weight: 222 lb (100.7 kg)   Height: 5\' 10"  (1.778 m)       End Time: 1242 hrs.  Imaging Guidance:          Type of Imaging Technique: None used Indication(s): N/A Exposure Time: No patient exposure Contrast: None used. Fluoroscopic Guidance: N/A Ultrasound Guidance: N/A Interpretation: N/A  Post-operative Assessment:  Post-procedure Vital Signs:  Pulse/HCG Rate: (!) 54  Temp: 97.9 F (36.6 C) Resp: 16 BP: (!) 154/83 SpO2: 99 %  EBL: None  Complications: No immediate post-treatment complications observed by team, or reported by patient.  Note: The patient tolerated the entire procedure well. A repeat set of vitals were taken after the procedure and the patient was kept under observation following institutional policy, for this type of procedure. Post-procedural neurological assessment was performed,  showing return to baseline, prior to discharge. The patient was provided with post-procedure discharge instructions, including a section on how to identify potential problems. Should any problems arise concerning this procedure, the patient was given instructions to immediately contact us, at any time, without hesitation. In any case, we plan to contact the patient by telephone for a follow-up status report regarding this interventional procedure.  Comments:  No additional relevant information.  Plan of Care (POC)  Orders:  No orders of the defined types were placed in this encounter.  Chronic Opioid Analgesic:  Hydrocodone 10 mg 4 times daily as needed, quantity 120/month; MME equals 40   Medications ordered for procedure: Meds ordered this encounter  Medications   capsaicin topical system 8 % patch 4 patch   Medications administered: We administered capsaicin topical system.  See the medical record for exact dosing, route, and time of administration.  Follow-up plan:   No follow-ups on file.       Recent Visits Date Type Provider Dept  06/17/23 Office Visit Edward Jolly, MD [facility]  Showing recent visits within past 90 days and meeting all other requirements Today's Visits Date Type Provider Dept  07/09/23 Procedure visit Edward Jolly, MD [facility]  Showing today's visits and meeting all other requirements Future Appointments Date Type Provider Dept  09/11/23 Appointment Edward Jolly, MD [facility]  Showing future appointments within next 90 days and meeting all other requirements  Disposition: Discharge home  Discharge (Date  Time): 07/09/2023; 1243 hrs.   Primary Care Physician: Erasmo Downer, MD Location: [facility] Note by: Edward Jolly, MD (TTS technology used. I apologize for any typographical errors that were not detected and corrected.) Date: 07/09/2023; Time: 1:12 PM  Disclaimer:   Medicine is not an Visual merchandiser. The only guarantee in medicine is that nothing is guaranteed. It is important to note that the decision to proceed with this intervention was based on the information collected from the patient. The Data and conclusions were drawn from the patient's questionnaire, the interview, and the physical examination. Because the information was provided in large part by the patient, it cannot be guaranteed that it has not been purposely or unconsciously manipulated. Every effort has been made to obtain as much relevant data as possible for this evaluation. It is important to note that the conclusions that lead to this procedure are  derived in large part from the available data. Always take into account that the treatment will also be dependent on availability of resources and existing treatment guidelines, considered by other Pain Management Practitioners as being common knowledge and practice, at the time of the intervention. For Medico-Legal purposes, it is also important to point out that variation in procedural techniques and pharmacological choices are the acceptable norm. The indications, contraindications, technique, and results of the above procedure should only be interpreted and judged by a Board-Certified Interventional Pain Specialist with extensive familiarity and expertise in the same exact procedure and technique.

## 2023-07-10 ENCOUNTER — Other Ambulatory Visit: Payer: Self-pay | Admitting: Family Medicine

## 2023-07-10 ENCOUNTER — Telehealth: Payer: Self-pay

## 2023-07-10 DIAGNOSIS — E1159 Type 2 diabetes mellitus with other circulatory complications: Secondary | ICD-10-CM

## 2023-07-10 NOTE — Telephone Encounter (Signed)
Post procedure follow up.  Patient states she is doing well.   ?

## 2023-07-11 ENCOUNTER — Encounter: Payer: Self-pay | Admitting: Hematology

## 2023-07-14 ENCOUNTER — Encounter: Payer: Self-pay | Admitting: Hematology

## 2023-07-14 ENCOUNTER — Ambulatory Visit (INDEPENDENT_AMBULATORY_CARE_PROVIDER_SITE_OTHER): Payer: MEDICAID | Admitting: Urology

## 2023-07-14 ENCOUNTER — Encounter: Payer: Self-pay | Admitting: Urology

## 2023-07-14 VITALS — BP 128/80 | HR 59 | Ht 70.0 in | Wt 222.0 lb

## 2023-07-14 DIAGNOSIS — N3281 Overactive bladder: Secondary | ICD-10-CM | POA: Diagnosis not present

## 2023-07-14 LAB — URINALYSIS, COMPLETE
Bilirubin, UA: NEGATIVE
Glucose, UA: NEGATIVE
Ketones, UA: NEGATIVE
Leukocytes,UA: NEGATIVE
Nitrite, UA: NEGATIVE
Protein,UA: NEGATIVE
Specific Gravity, UA: 1.02 (ref 1.005–1.030)
Urobilinogen, Ur: 0.2 mg/dL (ref 0.2–1.0)
pH, UA: 7 (ref 5.0–7.5)

## 2023-07-14 LAB — MICROSCOPIC EXAMINATION

## 2023-07-14 MED ORDER — NITROFURANTOIN MACROCRYSTAL 100 MG PO CAPS
100.0000 mg | ORAL_CAPSULE | Freq: Four times a day (QID) | ORAL | 11 refills | Status: DC
Start: 2023-07-14 — End: 2023-07-21

## 2023-07-14 NOTE — Progress Notes (Signed)
07/14/2023 10:17 AM   Jane [name] [birth year] [id number]  Referring provider: Erasmo Downer, MD [address] [place],  [place] [address]  Chief Complaint  Patient presents with   Over Active Bladder    HPI: Br: Chronic renal insufficiency overactive bladder and urinary tract infections.  Was on Gemtesa and vaginal estrogen cream.  Cystoscopy November 2022.  Has had improvement on Myrbetriq in the past.  Recently saw a Publishing rights manager on [place].  Having ongoing incontinence.  In May 2024 patient had a UTI with a positive culture.  He is on CPAP.  She was doing well on the [place] but the effect may have worn off.  Has had a normal CT scan.  Was worked up for microscopic hematuria in the past.  Was started on trimethoprim hoping it would down regulate her incontinence.  When she saw a nurse practitioner in June she had worsening incontinence and a urinary tract infection and the culture was positive  Today Patient thinks she may have a bladder infection now with burning.  She said when she gets infected she has frequency urge incontinence and worsening nocturia.  She says when she is not infected she can void every 2 hours and get up once a night and that she is continent.  She repeated that when she is not infected she does not think she leaks.  Never filled trimethoprim due to lack of approval.  I do not think she could afford the [facility] but she said it works very well when she was not infected.  Using estrogen twice a week.  Still has right sided flank discomfort that comes and goes been discussed before    PMH: Past Medical History:  Diagnosis Date   ADD (attention deficit disorder)    Anxiety    Back pain    Bilateral swelling of feet    Bipolar 1 disorder (HCC)    Bipolar 1 disorder (HCC)    Bipolar disorder (HCC)    Chewing difficulty    Chronic fatigue syndrome    Chronic kidney disease    Stage 3 kidney disease;dx by Dr. Kari Baars.     Constipation    Depression    Diabetes mellitus without complication (HCC)    diet controlled   Dyspnea    with exertion   GERD (gastroesophageal reflux disease)    Headache    migraines   High cholesterol    History of blood clots    History of DVT (deep vein thrombosis)    left leg   Hypertension    states under control with meds., has been on med. x 2 years   Hypothyroidism    Joint pain    Neuropathy    Obsessive-compulsive disorder    Peripheral vascular disease (HCC)    Prediabetes    Respiratory failure requiring intubation (HCC)    Restless leg syndrome    Shortness of breath    Sleep apnea    Thrombocytopenia (HCC) 09/15/2019   Trigger thumb of left hand 01/2018   Trigger thumb of right hand     Surgical History: Past Surgical History:  Procedure Laterality Date   ABDOMINAL HYSTERECTOMY  06/2016   complete   APPLICATION OF WOUND VAC Left 04/27/2019   Procedure: APPLICATION OF WOUND VAC LEFT GROIN;  Surgeon: Maeola Harman, MD;  Location: [facility] OR;  Service: Vascular;  Laterality: Left;   AV FISTULA PLACEMENT Left 11/24/2018   Procedure: ARTERIOVENOUS (AV) FISTULA CREATION LEFT SFA  TO LEFT FEMORAL VEIN;  Surgeon: Maeola Harman, MD;  Location: [facility] OR;  Service: Vascular;  Laterality: Left;   BACK SURGERY     CHOLECYSTECTOMY     COLONOSCOPY WITH PROPOFOL N/A 11/22/2019   Procedure: COLONOSCOPY WITH PROPOFOL;  Surgeon: Wyline Mood, MD;  Location: [facility] ENDOSCOPY;  Service: Gastroenterology;  Laterality: N/A;   COLONOSCOPY WITH PROPOFOL N/A 12/09/2022   Procedure: COLONOSCOPY WITH PROPOFOL;  Surgeon: Wyline Mood, MD;  Location: [facility] ENDOSCOPY;  Service: Gastroenterology;  Laterality: N/A;   FEMORAL ARTERY EXPLORATION  03/30/2019   Procedure: Left Common Femoral Artery and Vein Exploration;  Surgeon: Maeola Harman, MD;  Location: [facility] OR;  Service: Vascular;;   FEMORAL-FEMORAL BYPASS GRAFT Left 11/24/2018   Procedure: BYPASS GRAFT  FEMORAL-FEMORAL VENOUS LEFT TO RIGHT PALMA PROCEDURE USING CRYOVEIN;  Surgeon: Maeola Harman, MD;  Location: [facility] OR;  Service: Vascular;  Laterality: Left;   GROIN DEBRIDEMENT Left 04/27/2019   Procedure: GROIN DEBRIDEMENT;  Surgeon: Maeola Harman, MD;  Location: [facility] OR;  Service: Vascular;  Laterality: Left;   INSERTION OF ILIAC STENT  03/30/2019   Procedure: Stent of left common, external iliac veins and left common femoral vein;  Surgeon: Maeola Harman, MD;  Location: [facility] OR;  Service: Vascular;;   KNEE ARTHROSCOPY WITH MENISCAL REPAIR Left 11/14/2020   Procedure: LEFT KNEE ARTHROSCOPY WITH PARTIAL MEDIAL MENISCECTOMY;  Surgeon: Vickki Hearing, MD;  Location: [facility] ORS;  Service: Orthopedics;  Laterality: Left;   LOWER EXTREMITY VENOGRAPHY N/A 08/17/2018   Procedure: LOWER EXTREMITY VENOGRAPHY - Central Venogram;  Surgeon: Maeola Harman, MD;  Location: [facility] INVASIVE CV LAB;  Service: Cardiovascular;  Laterality: N/A;   LOWER EXTREMITY VENOGRAPHY Bilateral 03/09/2019   Procedure: LOWER EXTREMITY VENOGRAPHY;  Surgeon: Maeola Harman, MD;  Location: [facility] INVASIVE CV LAB;  Service: Cardiovascular;  Laterality: Bilateral;   LOWER EXTREMITY VENOGRAPHY Left 08/16/2019   Procedure: LOWER EXTREMITY VENOGRAPHY;  Surgeon: Maeola Harman, MD;  Location: [facility] INVASIVE CV LAB;  Service: Cardiovascular;  Laterality: Left;   LOWER EXTREMITY VENOGRAPHY Left 03/25/2022   Procedure: LOWER EXTREMITY VENOGRAPHY;  Surgeon: Maeola Harman, MD;  Location: [facility] INVASIVE CV LAB;  Service: Cardiovascular;  Laterality: Left;  IVUS   LUMBAR FUSION  11/21/2000   L5-S1   LUMBAR SPINE SURGERY     x 2 others   PATCH ANGIOPLASTY Left 03/30/2019   Procedure: Patch Angioplasty of the Left Common Femoral Vein using Venosure Biologic patch;  Surgeon: Maeola Harman, MD;  Location: [facility] OR;  Service: Vascular;  Laterality: Left;   PERIPHERAL VASCULAR  INTERVENTION Left 08/16/2019   Procedure: PERIPHERAL VASCULAR INTERVENTION;  Surgeon: Maeola Harman, MD;  Location: [facility] INVASIVE CV LAB;  Service: Cardiovascular;  Laterality: Left;  common femoral/femoral vein stent   PERIPHERAL VASCULAR INTERVENTION Left 03/25/2022   Procedure: PERIPHERAL VASCULAR INTERVENTION;  Surgeon: Maeola Harman, MD;  Location: [facility] INVASIVE CV LAB;  Service: Cardiovascular;  Laterality: Left;  COMMON FEMORAL VEIN   PERIPHERAL VASCULAR THROMBECTOMY Left 03/25/2022   Procedure: PERIPHERAL VASCULAR THROMBECTOMY;  Surgeon: Maeola Harman, MD;  Location: [facility] INVASIVE CV LAB;  Service: Cardiovascular;  Laterality: Left;  EXTREMITY/IVC   TRIGGER FINGER RELEASE Right 12/01/2017   Procedure: RELEASE TRIGGER FINGER/A-1 PULLEY RIGHT THUMB;  Surgeon: Betha Loa, MD;  Location: [facility];  Service: Orthopedics;  Laterality: Right;   TRIGGER FINGER RELEASE Left 01/26/2018   Procedure: LEFT TRIGGER THUMB RELEASE;  Surgeon: Betha Loa, MD;  Location: [facility];  Service: Orthopedics;  Laterality: Left;   ULTRASOUND GUIDANCE FOR VASCULAR ACCESS Right 03/30/2019   Procedure: Ultrasound-guided cannulation right internal jugular vein;  Surgeon: Maeola Harman, MD;  Location: [facility] OR;  Service: Vascular;  Laterality: Right;    Home Medications:  Allergies as of 07/14/2023       Reactions   Aripiprazole Other (See Comments)   BECOMES  VIOLENT   Seroquel [quetiapine Fumarate] Other (See Comments)   BECOMES VIOLENT   Chlorpromazine Other (See Comments)   SEVERE ANXIETY   Gabapentin Other (See Comments)   NIGHTMARES   Prednisone Hives   Quetiapine         Medication List        Accurate as of July 14, 2023 10:17 AM. If you have any questions, ask your nurse or doctor.          ALPRAZolam 1 MG tablet Commonly known as: XANAX Take 1 tablet (1 mg total) by mouth 2 (two) times daily as needed. for  anxiety   amphetamine-dextroamphetamine 30 MG tablet Commonly known as: ADDERALL Take 1 tablet by mouth 2 (two) times daily.   amphetamine-dextroamphetamine 30 MG tablet Commonly known as: ADDERALL Take 1 tablet by mouth 2 (two) times daily.   amphetamine-dextroamphetamine 30 MG tablet Commonly known as: Adderall Take 1 tablet by mouth 2 (two) times daily.   aspirin EC 81 MG tablet Take 81 mg by mouth daily.   atorvastatin 40 MG tablet Commonly known as: LIPITOR Take 1 tablet (40 mg total) by mouth daily.   cetirizine 10 MG tablet Commonly known as: ZYRTEC Take 1 tablet (10 mg total) by mouth daily.   estradiol 0.1 MG/GM vaginal cream Commonly known as: ESTRACE VAGINAL Apply 0.5mg  (pea-sized amount)  just inside the vaginal introitus with a finger-tip on Monday, Wednesday and Friday nights.   famotidine 20 MG tablet Commonly known as: PEPCID Take 20 mg by mouth daily.   FLUoxetine 40 MG capsule Commonly known as: PROZAC Take 1 capsule (40 mg total) by mouth 2 (two) times daily.   GARLIC PO Take 1 tablet by mouth daily.   Gemtesa 75 MG Tabs Generic drug: Vibegron Take 1 tablet (75 mg total) by mouth daily.   HYDROcodone-acetaminophen 10-325 MG tablet Commonly known as: NORCO Take 1 tablet by mouth every 6 (six) hours as needed. To last 30 days from fill date   HYDROcodone-acetaminophen 10-325 MG tablet Commonly known as: NORCO Take 1 tablet by mouth every 6 (six) hours as needed. To last 30 days from fill date Start taking on: July 24, 2023   HYDROcodone-acetaminophen 10-325 MG tablet Commonly known as: NORCO Take 1 tablet by mouth every 6 (six) hours as needed. To last 30 days from fill date Start taking on: August 23, 2023   ketoconazole 2 % shampoo Commonly known as: NIZORAL Apply topically daily.   levothyroxine 125 MCG tablet Commonly known as: SYNTHROID Take 1 tablet (125 mcg total) by mouth daily before breakfast.   linaclotide 145 MCG  Caps capsule Commonly known as: Linzess Take 1 capsule (145 mcg total) by mouth daily.   methocarbamol 750 MG tablet Commonly known as: ROBAXIN Take 1 tablet (750 mg total) by mouth 2 (two) times daily as needed for muscle spasms.   multivitamin capsule Take 1 capsule by mouth daily.   omega-3 acid ethyl esters 1 g capsule Commonly known as: LOVAZA Take by mouth.   Ozempic (1 MG/DOSE) 4 MG/3ML Sopn Generic drug: Semaglutide (1 MG/DOSE)  Inject 1 mg as directed once a week.   pantoprazole 40 MG tablet Commonly known as: PROTONIX Take 1 tablet (40 mg total) by mouth daily.   polyethylene glycol powder 17 GM/SCOOP powder Commonly known as: GLYCOLAX/MIRALAX Take 17 g by mouth daily.   potassium chloride 10 MEQ tablet Commonly known as: KLOR-CON M Take 1 tablet (10 mEq total) by mouth 3 (three) times daily.   pregabalin 100 MG capsule Commonly known as: LYRICA Take 1 capsule (100 mg total) by mouth 4 (four) times daily.   rOPINIRole 2 MG tablet Commonly known as: REQUIP Take 1 tablet (2 mg total) by mouth 2 (two) times daily.   traZODone 150 MG tablet Commonly known as: DESYREL Take 1 tablet (150 mg total) by mouth at bedtime.   valACYclovir 1000 MG tablet Commonly known as: VALTREX Take 2 tablets by mouth at first onset of fever blisters, then take 2 tablets by mouth 12 hours later.   warfarin 5 MG tablet Commonly known as: COUMADIN Take as directed by the anticoagulation clinic. If you are unsure how to take this medication, talk to your nurse or doctor. Original instructions: Take 1 tablet (5 mg total) by mouth daily.        Allergies:  Allergies  Allergen Reactions   Aripiprazole Other (See Comments)    BECOMES  VIOLENT    Seroquel [Quetiapine Fumarate] Other (See Comments)    BECOMES VIOLENT   Chlorpromazine Other (See Comments)    SEVERE ANXIETY    Gabapentin Other (See Comments)    NIGHTMARES    Prednisone Hives   Quetiapine     Family  History: Family History  Problem Relation Age of Onset   Heart disease Mother    Hyperlipidemia Mother    Hypertension Mother    Bipolar disorder Mother    Stroke Mother    Depression Mother    Sleep apnea Mother    Obesity Mother    Diabetes Father    Heart disease Father    Hyperlipidemia Father    Hypertension Father    Sleep apnea Father    Obesity Father    Drug abuse Daughter    ADD / ADHD Daughter    Drug abuse Daughter    Anxiety disorder Daughter    Bipolar disorder Daughter    Hypertension Sister    Hypertension Brother    Hyperlipidemia Brother    Heart disease Brother    Bipolar disorder Maternal Aunt    Suicidality Maternal Aunt     Social History:  reports that she has never smoked. She has never used smokeless tobacco. She reports that she does not drink alcohol and does not use drugs.  ROS:                                        Physical Exam: LMP  (LMP Unknown)   Constitutional:  Alert and oriented, No acute distress. HEENT: [place] AT, moist mucus membranes.  Trachea midline, no masses.   Laboratory Data: Lab Results  Component Value Date   WBC 4.7 05/09/2023   HGB 12.1 05/09/2023   HCT 36.6 05/09/2023   MCV 92 05/09/2023   PLT 114 (L) 05/09/2023    Lab Results  Component Value Date   CREATININE 1.27 (H) 05/09/2023    No results found for: "PSA"  No results found for: "TESTOSTERONE"  Lab Results  Component Value  Date   HGBA1C 5.7 (H) 05/09/2023    Urinalysis    Component Value Date/Time   COLORURINE YELLOW (A) 01/30/2023 1328   APPEARANCEUR Clear 04/08/2023 1042   LABSPEC 1.010 01/30/2023 1328   PHURINE 7.0 01/30/2023 1328   GLUCOSEU Negative 04/08/2023 1042   HGBUR SMALL (A) 01/30/2023 1328   BILIRUBINUR Negative 04/08/2023 1042   KETONESUR NEGATIVE 01/30/2023 1328   PROTEINUR Negative 04/08/2023 1042   PROTEINUR NEGATIVE 01/30/2023 1328   UROBILINOGEN 0.2 03/14/2023 1117   NITRITE Negative 04/08/2023  1042   NITRITE NEGATIVE 01/30/2023 1328   LEUKOCYTESUR Trace (A) 04/08/2023 1042   LEUKOCYTESUR NEGATIVE 01/30/2023 1328    Pertinent Imaging:   Assessment & Plan: Urine reviewed and sent for culture.  Patient understands that we want to control UTIs first.  Reassess in 3 months and daily Macrodantin 100 mg 30 x 11.  Flank discomfort not due to kidneys.  Reassess incontinence at that time.  1. OAB (overactive bladder)  - Urinalysis, Complete   No follow-ups on file.  Martina Sinner, MD  [facility] [address] [place], [place] [address] [phone]

## 2023-07-18 ENCOUNTER — Other Ambulatory Visit: Payer: Self-pay

## 2023-07-18 DIAGNOSIS — Z86718 Personal history of other venous thrombosis and embolism: Secondary | ICD-10-CM

## 2023-07-18 LAB — CULTURE, URINE COMPREHENSIVE

## 2023-07-21 ENCOUNTER — Telehealth: Payer: Self-pay | Admitting: *Deleted

## 2023-07-21 DIAGNOSIS — N3281 Overactive bladder: Secondary | ICD-10-CM

## 2023-07-21 MED ORDER — NITROFURANTOIN MACROCRYSTAL 100 MG PO CAPS: 100.0000 mg | ORAL_CAPSULE | Freq: Every day | ORAL | 11 refills | Status: DC

## 2023-07-21 MED ORDER — SULFAMETHOXAZOLE-TRIMETHOPRIM 800-160 MG PO TABS: 1.0000 | ORAL_TABLET | Freq: Two times a day (BID) | ORAL | 0 refills | Status: AC

## 2023-07-21 NOTE — Telephone Encounter (Signed)
.  left message to have patient return my call.  

## 2023-07-21 NOTE — Telephone Encounter (Signed)
-----   Message from [place] A Macdiarmid sent at 07/21/2023  8:24 AM EDT ----- Doylene Bode DS 1 tablet twice a day for 7 days Then start daily Macrodantin as previously ordered ----- Message ----- From: Interface, [facility] Lab Results In Sent: 07/14/2023  11:36 AM EDT To: Alfredo Martinez, MD

## 2023-07-21 NOTE — Telephone Encounter (Signed)
Spoke with patient and advised results rx sent to pharmacy by e-script

## 2023-07-23 NOTE — Telephone Encounter (Signed)
FYI

## 2023-08-06 ENCOUNTER — Ambulatory Visit: Payer: MEDICAID

## 2023-08-06 DIAGNOSIS — Z7901 Long term (current) use of anticoagulants: Secondary | ICD-10-CM | POA: Diagnosis not present

## 2023-08-06 LAB — POCT INR
INR: 3.3 — AB (ref 2.0–3.0)
PT: 39.4

## 2023-08-06 NOTE — Patient Instructions (Signed)
Description   7.5mg  Wednesday and Saturday and 5 mg daily. Per provider eat more greens. Follow-up in 2 weeks.

## 2023-08-12 ENCOUNTER — Ambulatory Visit (INDEPENDENT_AMBULATORY_CARE_PROVIDER_SITE_OTHER): Payer: MEDICAID | Admitting: Family Medicine

## 2023-08-12 ENCOUNTER — Other Ambulatory Visit (HOSPITAL_COMMUNITY): Payer: Self-pay | Admitting: Physician Assistant

## 2023-08-12 DIAGNOSIS — M25562 Pain in left knee: Secondary | ICD-10-CM

## 2023-08-13 ENCOUNTER — Encounter (INDEPENDENT_AMBULATORY_CARE_PROVIDER_SITE_OTHER): Payer: Self-pay | Admitting: Family Medicine

## 2023-08-13 ENCOUNTER — Ambulatory Visit (INDEPENDENT_AMBULATORY_CARE_PROVIDER_SITE_OTHER): Payer: MEDICAID | Admitting: Family Medicine

## 2023-08-13 ENCOUNTER — Other Ambulatory Visit: Payer: Self-pay

## 2023-08-13 VITALS — BP 115/66 | HR 71 | Temp 97.8°F | Ht 68.0 in | Wt 226.0 lb

## 2023-08-13 DIAGNOSIS — R944 Abnormal results of kidney function studies: Secondary | ICD-10-CM

## 2023-08-13 DIAGNOSIS — E1165 Type 2 diabetes mellitus with hyperglycemia: Secondary | ICD-10-CM

## 2023-08-13 DIAGNOSIS — Z6834 Body mass index (BMI) 34.0-34.9, adult: Secondary | ICD-10-CM | POA: Diagnosis not present

## 2023-08-13 DIAGNOSIS — E669 Obesity, unspecified: Secondary | ICD-10-CM

## 2023-08-13 DIAGNOSIS — Z7985 Long-term (current) use of injectable non-insulin antidiabetic drugs: Secondary | ICD-10-CM

## 2023-08-13 MED ORDER — SEMAGLUTIDE (1 MG/DOSE) 4 MG/3ML ~~LOC~~ SOPN
1.0000 mg | PEN_INJECTOR | SUBCUTANEOUS | 0 refills | Status: DC
  Filled 2023-08-13: qty 9, 84d supply, fill #0

## 2023-08-13 NOTE — Progress Notes (Signed)
Chief Complaint:   OBESITY Jane [name] is here to discuss her progress with her obesity treatment plan along with follow-up of her obesity related diagnoses. Jane [name] is on the Category 3 Plan and states she is following her eating plan approximately 50% of the time. Jane [name] states she is working out and using the pedal bike for 30-45 minutes 2-7 times per week.  Today's visit was #: 42 Starting weight: 283 lbs Starting date: 03/29/2020 Today's weight: 226 lbs Today's date: 08/13/2023 Total lbs lost to date: 57 Total lbs lost since last in-office visit: 0  Interim History: Patient voices she has been eating more over the last 5 weeks.  She didn't get to go to the beach for her birthday and she entertained for her birthday.  She has a pedal bike and does her hands and arms on the bike. This past week she has gotten more on plan.  She is in discussion to get knee surgery done.  She is trying to get more consistent on the plan and plans to be better in the next few weeks.  Subjective:   1. Type 2 diabetes mellitus with hyperglycemia, without long-term current use of insulin (HCC) Patient's last [name] was 5.7 in July.  She denies GI side effects on Ozempic.  She has been indulging more frequently than previously over the last few weeks.  2. Decreased GFR Patient's last creatinine was 1.27 in July; this is improved from previously.  Assessment/Plan:   1. Type 2 diabetes mellitus with hyperglycemia, without long-term current use of insulin (HCC) We will refill Ozempic 1 mg subcu weekly for 90 days.  - Semaglutide, 1 MG/DOSE, 4 MG/3ML SOPN; Inject 1 mg as directed once a week.  Dispense: 9 mL; Refill: 0  2. Decreased GFR We will follow-up on CMP at patient's next lab draw.  3. BMI 34.0-34.9,adult  4. Obesity with starting BMI of 43.0 Jane [name] is currently in the action stage of change. As such, her goal is to continue with weight loss efforts. She has agreed to the Category 3 Plan.   Exercise  goals: All adults should avoid inactivity. Some physical activity is better than none, and adults who participate in any amount of physical activity gain some health benefits.  Behavioral modification strategies: increasing lean protein intake, meal planning and cooking strategies, keeping healthy foods in the home, and planning for success.  Jane [name] has agreed to follow-up with our clinic in 4 weeks. She was informed of the importance of frequent follow-up visits to maximize her success with intensive lifestyle modifications for her multiple health conditions.   Objective:   Blood pressure 115/66, pulse 71, temperature 97.8 F (36.6 C), height 5\' 8"  (1.727 m), weight 226 lb (102.5 kg), SpO2 99%. Body mass index is 34.36 kg/m.  General: Cooperative, alert, well developed, in no acute distress. HEENT: Conjunctivae and lids unremarkable. Cardiovascular: Regular rhythm.  Lungs: Normal work of breathing. Neurologic: No focal deficits.   Lab Results  Component Value Date   CREATININE 1.27 (H) 05/09/2023   BUN 25 (H) 05/09/2023   NA 142 05/09/2023   K 4.7 05/09/2023   CL 103 05/09/2023   CO2 23 05/09/2023   Lab Results  Component Value Date   ALT 16 05/09/2023   AST 17 05/09/2023   ALKPHOS 79 05/09/2023   BILITOT 0.3 05/09/2023   Lab Results  Component Value Date   HGBA1C 5.7 (H) 05/09/2023   HGBA1C 5.9 (H) 11/07/2022   HGBA1C 5.8 (A) 04/22/2022  HGBA1C 6.2 (H) 11/06/2021   HGBA1C 6.3 (H) 03/22/2021   Lab Results  Component Value Date   INSULIN 21.5 03/29/2020   Lab Results  Component Value Date   TSH 1.440 11/07/2022   Lab Results  Component Value Date   CHOL 177 05/09/2023   HDL 47 05/09/2023   LDLCALC 109 (H) 05/09/2023   TRIG 115 05/09/2023   CHOLHDL 3.8 05/09/2023   Lab Results  Component Value Date   VD25OH 48.8 03/14/2023   VD25OH 47.10 02/21/2022   VD25OH 50.60 01/01/2021   Lab Results  Component Value Date   WBC 4.7 05/09/2023   HGB 12.1  05/09/2023   HCT 36.6 05/09/2023   MCV 92 05/09/2023   PLT 114 (L) 05/09/2023   Lab Results  Component Value Date   IRON 93 02/24/2023   TIBC 322 02/24/2023   FERRITIN 214 02/24/2023   Attestation Statements:   Reviewed by clinician on day of visit: allergies, medications, problem list, medical history, surgical history, family history, social history, and previous encounter notes.   I, Burt Knack, am acting as transcriptionist for Reuben Likes, MD.  I have reviewed the above documentation for accuracy and completeness, and I agree with the above. - Reuben Likes, MD

## 2023-08-18 ENCOUNTER — Other Ambulatory Visit (HOSPITAL_BASED_OUTPATIENT_CLINIC_OR_DEPARTMENT_OTHER): Payer: Self-pay

## 2023-08-18 ENCOUNTER — Other Ambulatory Visit (HOSPITAL_COMMUNITY): Payer: Self-pay

## 2023-08-19 ENCOUNTER — Other Ambulatory Visit (HOSPITAL_COMMUNITY): Payer: Self-pay

## 2023-08-19 ENCOUNTER — Ambulatory Visit (HOSPITAL_COMMUNITY)
Admission: RE | Admit: 2023-08-19 | Discharge: 2023-08-19 | Disposition: A | Discharge status: Home / Self Care | Payer: MEDICAID | Source: Ambulatory Visit | Attending: Physician Assistant | Admitting: Physician Assistant

## 2023-08-19 DIAGNOSIS — M25562 Pain in left knee: Secondary | ICD-10-CM | POA: Diagnosis present

## 2023-08-20 ENCOUNTER — Ambulatory Visit: Payer: MEDICAID

## 2023-08-20 DIAGNOSIS — Z7901 Long term (current) use of anticoagulants: Secondary | ICD-10-CM | POA: Diagnosis not present

## 2023-08-20 LAB — POCT INR
INR: 2.7 (ref 2.0–3.0)
POC INR: 2.7
PT: 32

## 2023-08-20 NOTE — Patient Instructions (Signed)
Description   7.5mg  Wednesday and Saturday and 5 mg daily. F/U 4 weeks

## 2023-08-21 ENCOUNTER — Other Ambulatory Visit (HOSPITAL_COMMUNITY): Payer: Self-pay

## 2023-08-22 ENCOUNTER — Other Ambulatory Visit (HOSPITAL_COMMUNITY): Payer: Self-pay

## 2023-08-25 ENCOUNTER — Telehealth (HOSPITAL_COMMUNITY): Payer: MEDICAID | Admitting: Psychiatry

## 2023-08-26 ENCOUNTER — Telehealth (INDEPENDENT_AMBULATORY_CARE_PROVIDER_SITE_OTHER): Payer: MEDICAID | Admitting: Psychiatry

## 2023-08-26 ENCOUNTER — Encounter (HOSPITAL_COMMUNITY): Payer: Self-pay | Admitting: Psychiatry

## 2023-08-26 DIAGNOSIS — F900 Attention-deficit hyperactivity disorder, predominantly inattentive type: Secondary | ICD-10-CM

## 2023-08-26 DIAGNOSIS — F3130 Bipolar disorder, current episode depressed, mild or moderate severity, unspecified: Secondary | ICD-10-CM

## 2023-08-26 MED ORDER — AMPHETAMINE-DEXTROAMPHETAMINE 30 MG PO TABS: 1.0000 | ORAL_TABLET | Freq: Two times a day (BID) | ORAL | 0 refills | Status: DC

## 2023-08-26 MED ORDER — FLUOXETINE HCL 40 MG PO CAPS: 40.0000 mg | ORAL_CAPSULE | Freq: Two times a day (BID) | ORAL | 2 refills | Status: DC

## 2023-08-26 MED ORDER — ALPRAZOLAM 1 MG PO TABS: 1.0000 mg | ORAL_TABLET | Freq: Two times a day (BID) | ORAL | 2 refills | Status: DC | PRN

## 2023-08-26 MED ORDER — AMPHETAMINE-DEXTROAMPHETAMINE 30 MG PO TABS: 30.0000 mg | ORAL_TABLET | Freq: Two times a day (BID) | ORAL | 0 refills | Status: DC

## 2023-08-26 MED ORDER — TRAZODONE HCL 150 MG PO TABS: 150.0000 mg | ORAL_TABLET | Freq: Every day | ORAL | 2 refills | Status: DC

## 2023-08-26 NOTE — Progress Notes (Signed)
Virtual Visit via Video Note  I connected with Jane [name] on 08/26/23 at  9:00 AM EDT by a video enabled telemedicine application and verified that I am speaking with the correct person using two identifiers.  Location: Patient: home Provider: office   I discussed the limitations of evaluation and management by telemedicine and the availability of in person appointments. The patient expressed understanding and agreed to proceed.     I discussed the assessment and treatment plan with the patient. The patient was provided an opportunity to ask questions and all were answered. The patient agreed with the plan and demonstrated an understanding of the instructions.   The patient was advised to call back or seek an in-person evaluation if the symptoms worsen or if the condition fails to improve as anticipated.  I provided 20 minutes of non-face-to-face time during this encounter.   Diannia Ruder, MD  [facility] MD/PA/NP OP Progress Note  08/26/2023 9:20 AM Jane [name]  MRN:  [id number]  Chief Complaint:  Chief Complaint  Patient presents with   Depression   Anxiety   Follow-up   HPI:  This patient is a 57 year old divorced white female who lives alone in [place]. She is on disability due to bipolar disorder.   The patient returns for follow-up after 3 months.  For the most part she is doing well.  She still having chronic knee pain and recently had an MRI but the results are not back.  She is probably going to have to have knee surgery or knee replacement.  She still does not have a vehicle and cannot afford 1 so she is somewhat isolated.  However her DSS worker takes her to appointments and she also has a peers support specialist who visits her.  One of her friends visits her once a month.  She states her daughters are not talking to her at all.  The patient denies significant depression however.  She states her mood has been stable most of the time and she denies thoughts of self-harm  or suicide.  She is focusing well with the Adderall and the Xanax continues to help with her diet anxiety. Visit Diagnosis:    ICD-10-CM   1. Bipolar I disorder, most recent episode depressed (HCC)  F31.30     2. Attention deficit hyperactivity disorder (ADHD), predominantly inattentive type  F90.0       Past Psychiatric History: Long-term outpatient treatment  Past Medical History:  Past Medical History:  Diagnosis Date   ADD (attention deficit disorder)    Anxiety    Back pain    Bilateral swelling of feet    Bipolar 1 disorder (HCC)    Bipolar 1 disorder (HCC)    Bipolar disorder (HCC)    Chewing difficulty    Chronic fatigue syndrome    Chronic kidney disease    Stage 3 kidney disease;dx by Dr. Kari Baars.    Constipation    Depression    Diabetes mellitus without complication (HCC)    diet controlled   Dyspnea    with exertion   GERD (gastroesophageal reflux disease)    Headache    migraines   High cholesterol    History of blood clots    History of DVT (deep vein thrombosis)    left leg   Hypertension    states under control with meds., has been on med. x 2 years   Hypothyroidism    Joint pain    Neuropathy    Obsessive-compulsive disorder  Peripheral vascular disease (HCC)    Prediabetes    Respiratory failure requiring intubation (HCC)    Restless leg syndrome    Shortness of breath    Sleep apnea    Thrombocytopenia (HCC) 09/15/2019   Trigger thumb of left hand 01/2018   Trigger thumb of right hand     Past Surgical History:  Procedure Laterality Date   ABDOMINAL HYSTERECTOMY  06/2016   complete   APPLICATION OF WOUND VAC Left 04/27/2019   Procedure: APPLICATION OF WOUND VAC LEFT GROIN;  Surgeon: Maeola Harman, MD;  Location: [facility] OR;  Service: Vascular;  Laterality: Left;   AV FISTULA PLACEMENT Left 11/24/2018   Procedure: ARTERIOVENOUS (AV) FISTULA CREATION LEFT SFA TO LEFT FEMORAL VEIN;  Surgeon: Maeola Harman, MD;   Location: [facility] OR;  Service: Vascular;  Laterality: Left;   BACK SURGERY     CHOLECYSTECTOMY     COLONOSCOPY WITH PROPOFOL N/A 11/22/2019   Procedure: COLONOSCOPY WITH PROPOFOL;  Surgeon: Wyline Mood, MD;  Location: [facility] ENDOSCOPY;  Service: Gastroenterology;  Laterality: N/A;   COLONOSCOPY WITH PROPOFOL N/A 12/09/2022   Procedure: COLONOSCOPY WITH PROPOFOL;  Surgeon: Wyline Mood, MD;  Location: [facility] ENDOSCOPY;  Service: Gastroenterology;  Laterality: N/A;   FEMORAL ARTERY EXPLORATION  03/30/2019   Procedure: Left Common Femoral Artery and Vein Exploration;  Surgeon: Maeola Harman, MD;  Location: [facility] OR;  Service: Vascular;;   FEMORAL-FEMORAL BYPASS GRAFT Left 11/24/2018   Procedure: BYPASS GRAFT FEMORAL-FEMORAL VENOUS LEFT TO RIGHT PALMA PROCEDURE USING CRYOVEIN;  Surgeon: Maeola Harman, MD;  Location: [facility] OR;  Service: Vascular;  Laterality: Left;   GROIN DEBRIDEMENT Left 04/27/2019   Procedure: GROIN DEBRIDEMENT;  Surgeon: Maeola Harman, MD;  Location[facility] OR;  Service: Vascular;  Laterality: Left;   INSERTION OF ILIAC STENT  03/30/2019   Procedure: Stent of left common, external iliac veins and left common femoral vein;  Surgeon: Maeola Harman, MD;  [facility] OR;  Service: Vascular;;   KNEE ARTHROSCOPY WITH MENISCAL REPAIR Left 11/14/2020   Procedure: LEFT KNEE ARTHROSCOPY WITH PARTIAL MEDIAL MENISCECTOMY;  Surgeon: Vickki Hearing, MD;  Location: [facility] ORS;  Service: Orthopedics;  Laterality: Left;   LOWER EXTREMITY VENOGRAPHY N/A 08/17/2018   Procedure: LOWER EXTREMITY VENOGRAPHY - Central Venogram;  Surgeon: Maeola Harman, MD;  Location: [facility] INVASIVE CV LAB;  Service: Cardiovascular;  Laterality: N/A;   LOWER EXTREMITY VENOGRAPHY Bilateral 03/09/2019   Procedure: LOWER EXTREMITY VENOGRAPHY;  Surgeon: Maeola Harman, MD;  Location: [facility] INVASIVE CV LAB;  Service: Cardiovascular;  Laterality: Bilateral;   LOWER EXTREMITY VENOGRAPHY  Left 08/16/2019   Procedure: LOWER EXTREMITY VENOGRAPHY;  Surgeon: Maeola Harman, MD;  Location: [facility] INVASIVE CV LAB;  Service: Cardiovascular;  Laterality: Left;   LOWER EXTREMITY VENOGRAPHY Left 03/25/2022   Procedure: LOWER EXTREMITY VENOGRAPHY;  Surgeon: Maeola Harman, MD;  Location: [facility] INVASIVE CV LAB;  Service: Cardiovascular;  Laterality: Left;  IVUS   LUMBAR FUSION  11/21/2000   L5-S1   LUMBAR SPINE SURGERY     x 2 others   PATCH ANGIOPLASTY Left 03/30/2019   Procedure: Patch Angioplasty of the Left Common Femoral Vein using Venosure Biologic patch;  Surgeon: Maeola Harman, MD;  Location: [facility] OR;  Service: Vascular;  Laterality: Left;   PERIPHERAL VASCULAR INTERVENTION Left 08/16/2019   Procedure: PERIPHERAL VASCULAR INTERVENTION;  Surgeon: Maeola Harman, MD;  Location: [facility] INVASIVE CV LAB;  Service: Cardiovascular;  Laterality: Left;  common femoral/femoral vein stent  PERIPHERAL VASCULAR INTERVENTION Left 03/25/2022   Procedure: PERIPHERAL VASCULAR INTERVENTION;  Surgeon: Maeola Harman, MD;  Location: [facility] INVASIVE CV LAB;  Service: Cardiovascular;  Laterality: Left;  COMMON FEMORAL VEIN   PERIPHERAL VASCULAR THROMBECTOMY Left 03/25/2022   Procedure: PERIPHERAL VASCULAR THROMBECTOMY;  Surgeon: Maeola Harman, MD;  Location: [facility] INVASIVE CV LAB;  Service: Cardiovascular;  Laterality: Left;  EXTREMITY/IVC   TRIGGER FINGER RELEASE Right 12/01/2017   Procedure: RELEASE TRIGGER FINGER/A-1 PULLEY RIGHT THUMB;  Surgeon: Betha Loa, MD;  Location: [facility];  Service: Orthopedics;  Laterality: Right;   TRIGGER FINGER RELEASE Left 01/26/2018   Procedure: LEFT TRIGGER THUMB RELEASE;  Surgeon: Betha Loa, MD;  Location: [facility];  Service: Orthopedics;  Laterality: Left;   ULTRASOUND GUIDANCE FOR VASCULAR ACCESS Right 03/30/2019   Procedure: Ultrasound-guided cannulation right internal jugular vein;   Surgeon: Maeola Harman, MD;  Location: [facility] OR;  Service: Vascular;  Laterality: Right;    Family Psychiatric History: See below  Family History:  Family History  Problem Relation Age of Onset   Heart disease Mother    Hyperlipidemia Mother    Hypertension Mother    Bipolar disorder Mother    Stroke Mother    Depression Mother    Sleep apnea Mother    Obesity Mother    Diabetes Father    Heart disease Father    Hyperlipidemia Father    Hypertension Father    Sleep apnea Father    Obesity Father    Drug abuse Daughter    ADD / ADHD Daughter    Drug abuse Daughter    Anxiety disorder Daughter    Bipolar disorder Daughter    Hypertension Sister    Hypertension Brother    Hyperlipidemia Brother    Heart disease Brother    Bipolar disorder Maternal Aunt    Suicidality Maternal Aunt     Social History:  Social History   Socioeconomic History   Marital status: Divorced    Spouse name: Not on file   Number of children: 3   Years of education: Not on file   Highest education level: 11th grade  Occupational History   Not on file  Tobacco Use   Smoking status: Never   Smokeless tobacco: Never  Vaping Use   Vaping status: Never Used  Substance and Sexual Activity   Alcohol use: No   Drug use: No   Sexual activity: Not Currently    Partners: Male    Birth control/protection: Surgical  Other Topics Concern   Not on file  Social History Narrative   Not on file   Social Determinants of Health   Financial Resource Strain: High Risk (03/12/2023)   Overall Financial Resource Strain (CARDIA)    Difficulty of Paying Living Expenses: Hard  Food Insecurity: Food Insecurity Present (03/12/2023)   Hunger Vital Sign    Worried About Running Out of Food in the Last Year: Often true    Ran Out of Food in the Last Year: Often true  Transportation Needs: Unmet Transportation Needs (03/12/2023)   PRAPARE - Transportation    Lack of Transportation (Medical): No    Lack  of Transportation (Non-Medical): Yes  Physical Activity: Sufficiently Active (03/12/2023)   Exercise Vital Sign    Days of Exercise per Week: 7 days    Minutes of Exercise per Session: 40 min  Stress: No Stress Concern Present (03/12/2023)   [employer] of Occupational Health - Occupational Stress Questionnaire  Feeling of Stress : Only a little  Social Connections: Socially Isolated (03/12/2023)   Social Connection and Isolation Panel [NHANES]    Frequency of Communication with Friends and Family: More than three times a week    Frequency of Social Gatherings with Friends and Family: Never    Attends Religious Services: Never    Database administrator or Organizations: No    Attends Engineer, structural: Not on file    Marital Status: Divorced    Allergies:  Allergies  Allergen Reactions   Aripiprazole Other (See Comments)    BECOMES  VIOLENT    Seroquel [Quetiapine Fumarate] Other (See Comments)    BECOMES VIOLENT   Chlorpromazine Other (See Comments)    SEVERE ANXIETY    Gabapentin Other (See Comments)    NIGHTMARES    Prednisone Hives   Quetiapine     Metabolic Disorder Labs: Lab Results  Component Value Date   HGBA1C 5.7 (H) 05/09/2023   MPG 131.24 11/06/2021   MPG 134.11 03/26/2019   No results found for: "PROLACTIN" Lab Results  Component Value Date   CHOL 177 05/09/2023   TRIG 115 05/09/2023   HDL 47 05/09/2023   CHOLHDL 3.8 05/09/2023   VLDL 37 11/06/2021   LDLCALC 109 (H) 05/09/2023   LDLCALC 105 (H) 11/07/2022   Lab Results  Component Value Date   TSH 1.440 11/07/2022   TSH 1.420 08/06/2022    Therapeutic Level Labs: No results found for: "LITHIUM" Lab Results  Component Value Date   VALPROATE 61.2 03/23/2014   No results found for: "CBMZ"  Current Medications: Current Outpatient Medications  Medication Sig Dispense Refill   ALPRAZolam (XANAX) 1 MG tablet Take 1 tablet (1 mg total) by mouth 2 (two) times daily as needed. for  anxiety 60 tablet 2   amphetamine-dextroamphetamine (ADDERALL) 30 MG tablet Take 1 tablet by mouth 2 (two) times daily. 60 tablet 0   amphetamine-dextroamphetamine (ADDERALL) 30 MG tablet Take 1 tablet by mouth 2 (two) times daily. 60 tablet 0   amphetamine-dextroamphetamine (ADDERALL) 30 MG tablet Take 1 tablet by mouth 2 (two) times daily. 60 tablet 0   aspirin EC 81 MG tablet Take 81 mg by mouth daily.     atorvastatin (LIPITOR) 40 MG tablet Take 1 tablet (40 mg total) by mouth daily. 90 tablet 3   cetirizine (ZYRTEC) 10 MG tablet Take 1 tablet (10 mg total) by mouth daily. 90 tablet 3   estradiol (ESTRACE VAGINAL) 0.1 MG/GM vaginal cream Apply 0.5mg  (pea-sized amount)  just inside the vaginal introitus with a finger-tip on Monday, Wednesday and Friday nights. 30 g 12   famotidine (PEPCID) 20 MG tablet Take 20 mg by mouth daily.     FLUoxetine (PROZAC) 40 MG capsule Take 1 capsule (40 mg total) by mouth 2 (two) times daily. 180 capsule 2   GARLIC PO Take 1 tablet by mouth daily.     HYDROcodone-acetaminophen (NORCO) 10-325 MG tablet Take 1 tablet by mouth every 6 (six) hours as needed. To last 30 days from fill date 120 tablet 0   levothyroxine (SYNTHROID) 125 MCG tablet Take 1 tablet (125 mcg total) by mouth daily before breakfast. 90 tablet 3   linaclotide (LINZESS) 145 MCG CAPS capsule Take 1 capsule (145 mcg total) by mouth daily. 90 capsule 3   methocarbamol (ROBAXIN) 750 MG tablet Take 1 tablet (750 mg total) by mouth 2 (two) times daily as needed for muscle spasms. 60 tablet 5  Multiple Vitamin (MULTIVITAMIN) capsule Take 1 capsule by mouth daily.     nitrofurantoin (MACRODANTIN) 100 MG capsule Take 1 capsule (100 mg total) by mouth daily. 30 capsule 11   omega-3 acid ethyl esters (LOVAZA) 1 g capsule Take by mouth.     pantoprazole (PROTONIX) 40 MG tablet Take 1 tablet (40 mg total) by mouth daily. 90 tablet 3   polyethylene glycol powder (GLYCOLAX/MIRALAX) 17 GM/SCOOP powder Take 17 g  by mouth daily. 850 g 1   potassium chloride (KLOR-CON M) 10 MEQ tablet Take 1 tablet (10 mEq total) by mouth 3 (three) times daily. 270 tablet 3   pregabalin (LYRICA) 100 MG capsule Take 1 capsule (100 mg total) by mouth 4 (four) times daily. 120 capsule 5   rOPINIRole (REQUIP) 2 MG tablet Take 1 tablet (2 mg total) by mouth 2 (two) times daily. 180 tablet 1   Semaglutide, 1 MG/DOSE, 4 MG/3ML SOPN Inject 1 mg as directed once a week. 9 mL 0   traZODone (DESYREL) 150 MG tablet Take 1 tablet (150 mg total) by mouth at bedtime. 30 tablet 2   valACYclovir (VALTREX) 1000 MG tablet Take 2 tablets by mouth at first onset of fever blisters, then take 2 tablets by mouth 12 hours later. 30 tablet 3   Vibegron (GEMTESA) 75 MG TABS Take 1 tablet (75 mg total) by mouth daily. 90 tablet 3   warfarin (COUMADIN) 5 MG tablet Take 1 tablet (5 mg total) by mouth daily. 90 tablet 3   No current facility-administered medications for this visit.     Musculoskeletal: Strength & Muscle Tone: within normal limits Gait & Station: normal Patient leans: N/A  Psychiatric Specialty Exam: Review of Systems  Musculoskeletal:  Positive for arthralgias.  Neurological:  Positive for headaches.  All other systems reviewed and are negative.   There were no vitals taken for this visit.There is no height or weight on file to calculate BMI.  General Appearance: Casual and Fairly Groomed  Eye Contact:  Good  Speech:  Clear and Coherent  Volume:  Normal  Mood:  Euthymic  Affect:  Congruent  Thought Process:  Goal Directed  Orientation:  Full (Time, Place, and Person)  Thought Content: WDL   Suicidal Thoughts:  No  Homicidal Thoughts:  No  Memory:  Immediate;   Good Recent;   Good Remote;   Fair  Judgement:  Good  Insight:  Good  Psychomotor Activity:  Decreased  Concentration:  Concentration: Good and Attention Span: Good  Recall:  Good  Fund of Knowledge: Good  Language: Good  Akathisia:  No  Handed:  Right   AIMS (if indicated): not done  Assets:  Communication Skills Desire for Improvement Resilience Social Support  ADL's:  Intact  Cognition: WNL  Sleep:  Good   Screenings: PHQ2-9    Flowsheet Row Office Visit from 05/09/2023 in [facility] Office Visit from 03/14/2023 in [facility] Video Visit from 01/02/2023 in [facility] [place] Office Visit from 11/07/2022 in [facility] Office Visit from 09/12/2022 in [facility] Total Score 2 4 5 3  0  PHQ-9 Total Score 10 18 15 10  --      Flowsheet Row ED from 01/30/2023 in [facility] Emergency Department at [facility] Video Visit from 01/02/2023 in [facility] [place] Admission (Discharged) from 12/09/2022 in [facility]  C-SSRS RISK CATEGORY No Risk No Risk No Risk        Assessment and Plan: This patient is a 57 year old female with a history of depression anxiety and ADD.  She continues to do well on her current regimen.  She will continue Prozac 80 mg daily for depression, Xanax 1 mg twice daily for anxiety, Adderall 30 mg twice daily for ADD and trazodone 150 mg at bedtime for sleep.  She will return to see me in 3 months  Collaboration of Care: Collaboration of Care: Primary Care Provider AEB notes will be shared with PCP at patient's request  Patient/Guardian was advised Release of Information must be obtained prior to any record release in order to collaborate their care with an outside provider. Patient/Guardian was advised if they have not already done so to contact the registration department to sign all necessary forms in order for [place] to release information regarding their care.   Consent: Patient/Guardian gives verbal consent for treatment and assignment of benefits for services  provided during this visit. Patient/Guardian expressed understanding and agreed to proceed.    Diannia Ruder, MD 08/26/2023, 9:21 AM

## 2023-08-27 ENCOUNTER — Ambulatory Visit: Payer: MEDICAID | Admitting: Podiatry

## 2023-08-27 ENCOUNTER — Encounter: Payer: Self-pay | Admitting: Podiatry

## 2023-08-27 VITALS — BP 130/75 | HR 58

## 2023-08-27 DIAGNOSIS — M7671 Peroneal tendinitis, right leg: Secondary | ICD-10-CM | POA: Diagnosis not present

## 2023-08-27 DIAGNOSIS — M7751 Other enthesopathy of right foot: Secondary | ICD-10-CM

## 2023-08-27 NOTE — Patient Instructions (Signed)
VISIT SUMMARY:  You came in today because of persistent pain on the outside of your right foot, specifically at the base of the fifth metatarsal. You mentioned that the pain occurs when you walk, whether you are wearing shoes or not, and it has been significant enough to seek medical attention.  YOUR PLAN:  -INSERTIONAL PERONEAL TENDONITIS AND BURSITIS: This condition involves inflammation of the tendon and bursa at the base of the fifth metatarsal on your foot. We have started you on a home physical therapy plan with specific exercises to help alleviate the pain. Additionally, you received a steroid injection to reduce inflammation and improve function. We also discussed the importance of wearing appropriate shoes to help manage the pain. Please follow up as needed.  INSTRUCTIONS:  Please follow up as needed if the pain persists or worsens.   Peroneal Tendinopathy Rehab Ask your health care provider which exercises are safe for you. Do exercises exactly as told by your health care provider and adjust them as directed. It is normal to feel mild stretching, pulling, tightness, or discomfort as you do these exercises. Stop right away if you feel sudden pain or your pain gets worse. Do not begin these exercises until told by your health care provider. Stretching and range-of-motion exercises These exercises warm up your muscles and joints and improve the movement and flexibility of your ankle. These exercises also help to relieve pain and stiffness. Gastroc and soleus stretch, standing  This is an exercise in which you stand on a step and use your body weight to stretch your calf muscles. To do this exercise: Stand on the edge of a step on the ball of your left / right foot. The ball of your foot is on the walking surface, right under your toes. Keep your other foot firmly on the same step. Hold on to the wall, a railing, or a chair for balance. Slowly lift your other foot, allowing your body  weight to press your left / right heel down over the edge of the step. You should feel a stretch in your left / right calf (gastrocnemius and soleus). Hold this position for 15 seconds. Return both feet to the step. Repeat this exercise with a slight bend in your left / right knee. Repeat 5 times with your left / right knee straight and 5 times with your left / right knee bent. Complete this exercise 2 times a day. Strengthening exercises These exercises build strength and endurance in your foot and ankle. Endurance is the ability to use your muscles for a long time, even after they get tired. Ankle dorsiflexion with band   Secure a rubber exercise band or tube to an object, such as a table leg, that will not move when the band is pulled. Secure the other end of the band around your left / right foot. Sit on the floor, facing the object with your left / right leg extended. The band or tube should be slightly tense when your foot is relaxed. Slowly flex your left / right ankle and toes to bring your foot toward you (dorsiflexion). Hold this position for 15 seconds. Let the band or tube slowly pull your foot back to the starting position. Repeat 5 times. Complete this exercise 2 times a day. Ankle eversion Sit on the floor with your legs straight out in front of you. Loop a rubber exercise band or tube around the ball of your left / right foot. The ball of your foot is  on the walking surface, right under your toes. Hold the ends of the band in your hands, or secure the band to a stable object. The band or tube should be slightly tense when your foot is relaxed. Slowly push your foot outward, away from your other leg (eversion). Hold this position for 15 seconds. Slowly return your foot to the starting position. Repeat 5 times. Complete this exercise 2 times a day. Plantar flexion, standing  This exercise is sometimes called standing heel raise. Stand with your feet shoulder-width  apart. Place your hands on a wall or table to steady yourself as needed, but try not to use it for support. Keep your weight spread evenly over the width of your feet while you slowly rise up on your toes (plantar flexion). If told by your health care provider: Shift your weight toward your left / right leg until you feel challenged. Stand on your left / right leg only. Hold this position for 15 seconds. Repeat 2 times. Complete this exercise 2 times a day. Single leg stand Without shoes, stand near a railing or in a doorway. You may hold on to the railing or door frame as needed. Stand on your left / right foot. Keep your big toe down on the floor and try to keep your arch lifted. Do not roll to the outside of your foot. If this exercise is too easy, you can try it with your eyes closed or while standing on a pillow. Hold this position for 15 seconds. Repeat 5 times. Complete this exercise 2 times a day. This information is not intended to replace advice given to you by your health care provider. Make sure you discuss any questions you have with your health care provider. Document Revised: 02/09/2019 Document Reviewed: 02/09/2019 Elsevier Patient Education  2020 [employer].

## 2023-08-27 NOTE — Progress Notes (Signed)
Subjective:  Patient ID: Jane [name], female    DOB: 21-Sep-1966,  MRN: [id number]  Chief Complaint  Patient presents with   Foot Pain    "On my right foot, there is a lump and it's hard to walk on."    Discussed the use of AI scribe software for clinical note transcription with the patient, who gave verbal consent to proceed.  History of Present Illness   The patient presents with persistent pain located on the outside of the right foot, specifically at the base of the fifth metatarsal. The pain is present during ambulation, regardless of whether the patient is wearing shoes. The patient denies any recent traumatic injury or noticeable bruising, although visibility of the foot's underside is limited. The discomfort is significant enough to prompt a medical consultation.          Objective:    Physical Exam   MUSCULOSKELETAL: Pain and palpation to the plantar and lateral fifth metatarsal base, and along the peroneal tendon insertion with an area of fluctuance plantar to the fifth metatarsal base.       No images are attached to the encounter.    Results          Assessment:   1. Bursitis of right foot   2. Peroneal tendinitis of right lower extremity      Plan:  Patient was evaluated and treated and all questions answered.  Assessment and Plan    Insertional Peroneal Tendonitis and Bursitis   She exhibits pain and palpation to the plantar and lateral fifth metatarsal base and along the peroneal tendon insertion, with an area of fluctuance plantar to the fifth metatarsal base, without a history of trauma. We initiated a home physical therapy plan and exercises. A steroid injection was administered to the area of the bursa with 20mg  of Kenalog, 2mg  of dexamethasone, and 1cc of sterile prep with alcohol to reduce inflammation and improve function. We discussed appropriate shoe gear to alleviate pain and advised follow-up as needed.          Return if symptoms  worsen or fail to improve.

## 2023-08-31 ENCOUNTER — Other Ambulatory Visit: Payer: Self-pay | Admitting: Family Medicine

## 2023-08-31 DIAGNOSIS — Z7901 Long term (current) use of anticoagulants: Secondary | ICD-10-CM

## 2023-09-09 ENCOUNTER — Other Ambulatory Visit (HOSPITAL_COMMUNITY): Payer: Self-pay

## 2023-09-09 ENCOUNTER — Encounter (INDEPENDENT_AMBULATORY_CARE_PROVIDER_SITE_OTHER): Payer: Self-pay | Admitting: Family Medicine

## 2023-09-09 ENCOUNTER — Other Ambulatory Visit: Payer: Self-pay

## 2023-09-09 ENCOUNTER — Encounter: Payer: Self-pay | Admitting: Hematology

## 2023-09-09 ENCOUNTER — Telehealth (INDEPENDENT_AMBULATORY_CARE_PROVIDER_SITE_OTHER): Payer: Self-pay

## 2023-09-09 ENCOUNTER — Ambulatory Visit (INDEPENDENT_AMBULATORY_CARE_PROVIDER_SITE_OTHER): Payer: MEDICAID | Admitting: Family Medicine

## 2023-09-09 VITALS — BP 135/71 | HR 54 | Temp 98.0°F | Ht 68.0 in | Wt 228.0 lb

## 2023-09-09 DIAGNOSIS — Z6834 Body mass index (BMI) 34.0-34.9, adult: Secondary | ICD-10-CM

## 2023-09-09 DIAGNOSIS — E1159 Type 2 diabetes mellitus with other circulatory complications: Secondary | ICD-10-CM

## 2023-09-09 DIAGNOSIS — I152 Hypertension secondary to endocrine disorders: Secondary | ICD-10-CM

## 2023-09-09 DIAGNOSIS — Z7985 Long-term (current) use of injectable non-insulin antidiabetic drugs: Secondary | ICD-10-CM

## 2023-09-09 DIAGNOSIS — E1165 Type 2 diabetes mellitus with hyperglycemia: Secondary | ICD-10-CM | POA: Insufficient documentation

## 2023-09-09 MED ORDER — SEMAGLUTIDE (2 MG/DOSE) 8 MG/3ML ~~LOC~~ SOPN
2.0000 mg | PEN_INJECTOR | SUBCUTANEOUS | 0 refills | Status: DC
Start: 2023-09-09 — End: 2023-12-31
  Filled 2023-09-09: qty 9, 84d supply, fill #0

## 2023-09-09 NOTE — Progress Notes (Signed)
[facility] [place] [address] [place] [address] Dept: [phone] Dept Fax: [phone]  SUBJECTIVE:  Chief Complaint: Obesity  Interim History: Patient is feeling like she is hungry all the time even with the 1mg  of Ozempic.  She is feeling some stress and depression and she is sometimes emotionally eating.  She is trying to get 3000 steps daily.  Her knee pain is really interfering with her ability to do much activity. She needs an injection.  She is aiming to be more nutrition conscious for Thanksgiving.  The last two months she has really struggled with compliance to the meal plan.   Ketina is here to discuss her progress with her obesity treatment plan. She is on the Category 3 Plan and states she is following her eating plan approximately 25 % of the time. She states she is not exercising.   OBJECTIVE: Visit Diagnoses: Problem List Items Addressed This Visit       Cardiovascular and Mediastinum   Hypertension associated with diabetes (HCC) (Chronic)    Her blood pressure was elevated on first reading today.  She knows her BP has been high because she is getting intermittent headaches.  She recognizes she needs to make an appointment with her PCP for evaluation of her BP.      Relevant Medications   Semaglutide, 2 MG/DOSE, 8 MG/3ML SOPN     Endocrine   Type 2 diabetes mellitus with hyperglycemia (HCC) - Primary    Significant increase drive and desire to snack and eat.  She is on 1mg  Ozempic with no side effects noted.  She mentions she doesn't feel the same satiety as she did previously.  Did discuss going up to 2mg  dose and depending on response can either stay at 2mg  or makeshift a 1.5mg  dose.  New Rx sent in at higher dose.      Relevant Medications   Semaglutide, 2 MG/DOSE, 8 MG/3ML SOPN    Vitals Temp: 98 F (36.7 C) BP: 135/71 Pulse Rate: (!) 54 SpO2: 94 %   Anthropometric  Measurements Height: 5\' 8"  (1.727 m) Weight: 228 lb (103.4 kg) BMI (Calculated): 34.68 Weight at Last Visit: 226 lb Weight Lost Since Last Visit: 0 Weight Gained Since Last Visit: 2 Starting Weight: 283 lb Total Weight Loss (lbs): 55 lb (24.9 kg)   Body Composition  Body Fat %: 46.6 % Fat Mass (lbs): 106.4 lbs Muscle Mass (lbs): 115.6 lbs Total Body Water (lbs): 81.4 lbs Visceral Fat Rating : 13   Other Clinical Data Today's Visit #: 71 Starting Date: 03/29/20     ASSESSMENT AND PLAN:  Diet: Jonni is currently in the action stage of change. As such, her goal is to continue with weight loss efforts. She has agreed to Category 3 Plan.  Exercise: Juanna has been instructed that some exercise is better than none for weight loss and overall health benefits.   Behavior Modification:  We discussed the following Behavioral Modification Strategies today: increasing lean protein intake, better snacking choices, emotional eating strategies , and holiday eating strategies.   No follow-ups on file.Marland Kitchen She was informed of the importance of frequent follow up visits to maximize her success with intensive lifestyle modifications for her multiple health conditions.  Attestation Statements:   Reviewed by clinician on day of visit: allergies, medications, problem list, medical history, surgical history, family history, social history, and previous encounter notes.   Reuben Likes, MD

## 2023-09-09 NOTE — Assessment & Plan Note (Signed)
Her blood pressure was elevated on first reading today.  She knows her BP has been high because she is getting intermittent headaches.  She recognizes she needs to make an appointment with her PCP for evaluation of her BP.

## 2023-09-09 NOTE — Telephone Encounter (Signed)
Prior Auth submitted for Ozempic.  Awaiting determination.

## 2023-09-09 NOTE — Assessment & Plan Note (Signed)
Significant increase drive and desire to snack and eat.  She is on 1mg  Ozempic with no side effects noted.  She mentions she doesn't feel the same satiety as she did previously.  Did discuss going up to 2mg  dose and depending on response can either stay at 2mg  or makeshift a 1.5mg  dose.  New Rx sent in at higher dose.

## 2023-09-10 ENCOUNTER — Encounter: Payer: Self-pay | Admitting: Pharmacist

## 2023-09-10 ENCOUNTER — Other Ambulatory Visit (HOSPITAL_COMMUNITY): Payer: Self-pay

## 2023-09-10 ENCOUNTER — Other Ambulatory Visit: Payer: Self-pay

## 2023-09-11 ENCOUNTER — Other Ambulatory Visit: Payer: Self-pay

## 2023-09-11 ENCOUNTER — Inpatient Hospital Stay: Payer: MEDICAID | Attending: Hematology

## 2023-09-11 ENCOUNTER — Ambulatory Visit
Payer: MEDICAID | Attending: Student in an Organized Health Care Education/Training Program | Admitting: Student in an Organized Health Care Education/Training Program

## 2023-09-11 ENCOUNTER — Other Ambulatory Visit (HOSPITAL_COMMUNITY): Payer: Self-pay

## 2023-09-11 ENCOUNTER — Encounter: Payer: Self-pay | Admitting: Student in an Organized Health Care Education/Training Program

## 2023-09-11 VITALS — BP 141/63 | HR 64 | Temp 98.0°F | Resp 16 | Ht 70.0 in | Wt 228.0 lb

## 2023-09-11 DIAGNOSIS — M546 Pain in thoracic spine: Secondary | ICD-10-CM | POA: Diagnosis present

## 2023-09-11 DIAGNOSIS — G8929 Other chronic pain: Secondary | ICD-10-CM | POA: Diagnosis present

## 2023-09-11 DIAGNOSIS — I871 Compression of vein: Secondary | ICD-10-CM

## 2023-09-11 DIAGNOSIS — Z0289 Encounter for other administrative examinations: Secondary | ICD-10-CM | POA: Diagnosis present

## 2023-09-11 DIAGNOSIS — N189 Chronic kidney disease, unspecified: Secondary | ICD-10-CM | POA: Diagnosis not present

## 2023-09-11 DIAGNOSIS — G894 Chronic pain syndrome: Secondary | ICD-10-CM

## 2023-09-11 DIAGNOSIS — D631 Anemia in chronic kidney disease: Secondary | ICD-10-CM | POA: Insufficient documentation

## 2023-09-11 DIAGNOSIS — M170 Bilateral primary osteoarthritis of knee: Secondary | ICD-10-CM | POA: Diagnosis present

## 2023-09-11 DIAGNOSIS — M792 Neuralgia and neuritis, unspecified: Secondary | ICD-10-CM | POA: Diagnosis present

## 2023-09-11 DIAGNOSIS — M7918 Myalgia, other site: Secondary | ICD-10-CM | POA: Diagnosis present

## 2023-09-11 DIAGNOSIS — M961 Postlaminectomy syndrome, not elsewhere classified: Secondary | ICD-10-CM | POA: Diagnosis present

## 2023-09-11 DIAGNOSIS — D508 Other iron deficiency anemias: Secondary | ICD-10-CM | POA: Insufficient documentation

## 2023-09-11 DIAGNOSIS — E1142 Type 2 diabetes mellitus with diabetic polyneuropathy: Secondary | ICD-10-CM

## 2023-09-11 DIAGNOSIS — D509 Iron deficiency anemia, unspecified: Secondary | ICD-10-CM

## 2023-09-11 DIAGNOSIS — M25562 Pain in left knee: Secondary | ICD-10-CM | POA: Insufficient documentation

## 2023-09-11 DIAGNOSIS — D649 Anemia, unspecified: Secondary | ICD-10-CM

## 2023-09-11 DIAGNOSIS — M25561 Pain in right knee: Secondary | ICD-10-CM | POA: Diagnosis present

## 2023-09-11 DIAGNOSIS — D696 Thrombocytopenia, unspecified: Secondary | ICD-10-CM | POA: Diagnosis not present

## 2023-09-11 DIAGNOSIS — Z7984 Long term (current) use of oral hypoglycemic drugs: Secondary | ICD-10-CM

## 2023-09-11 LAB — CBC WITH DIFFERENTIAL/PLATELET
Abs Immature Granulocytes: 0.02 10*3/uL (ref 0.00–0.07)
Basophils Absolute: 0 10*3/uL (ref 0.0–0.1)
Basophils Relative: 1 %
Eosinophils Absolute: 0.1 10*3/uL (ref 0.0–0.5)
Eosinophils Relative: 1 %
HCT: 40 % (ref 36.0–46.0)
Hemoglobin: 12.8 g/dL (ref 12.0–15.0)
Immature Granulocytes: 0 %
Lymphocytes Relative: 30 %
Lymphs Abs: 1.8 10*3/uL (ref 0.7–4.0)
MCH: 31.1 pg (ref 26.0–34.0)
MCHC: 32 g/dL (ref 30.0–36.0)
MCV: 97.1 fL (ref 80.0–100.0)
Monocytes Absolute: 0.5 10*3/uL (ref 0.1–1.0)
Monocytes Relative: 9 %
Neutro Abs: 3.6 10*3/uL (ref 1.7–7.7)
Neutrophils Relative %: 59 %
Platelets: 137 10*3/uL — ABNORMAL LOW (ref 150–400)
RBC: 4.12 MIL/uL (ref 3.87–5.11)
RDW: 13.2 % (ref 11.5–15.5)
WBC: 6.1 10*3/uL (ref 4.0–10.5)
nRBC: 0 % (ref 0.0–0.2)

## 2023-09-11 LAB — IRON AND TIBC
Iron: 71 ug/dL (ref 28–170)
Saturation Ratios: 24 % (ref 10.4–31.8)
TIBC: 300 ug/dL (ref 250–450)
UIBC: 229 ug/dL

## 2023-09-11 LAB — COMPREHENSIVE METABOLIC PANEL
ALT: 19 U/L (ref 0–44)
AST: 17 U/L (ref 15–41)
Albumin: 4 g/dL (ref 3.5–5.0)
Alkaline Phosphatase: 70 U/L (ref 38–126)
Anion gap: 9 (ref 5–15)
BUN: 28 mg/dL — ABNORMAL HIGH (ref 6–20)
CO2: 27 mmol/L (ref 22–32)
Calcium: 9.3 mg/dL (ref 8.9–10.3)
Chloride: 102 mmol/L (ref 98–111)
Creatinine, Ser: 1.35 mg/dL — ABNORMAL HIGH (ref 0.44–1.00)
GFR, Estimated: 46 mL/min — ABNORMAL LOW (ref >60)
Glucose, Bld: 84 mg/dL (ref 70–99)
Potassium: 4.2 mmol/L (ref 3.5–5.1)
Sodium: 138 mmol/L (ref 135–145)
Total Bilirubin: 0.4 mg/dL (ref <1.2)
Total Protein: 7.2 g/dL (ref 6.5–8.1)

## 2023-09-11 LAB — LACTATE DEHYDROGENASE: LDH: 165 U/L (ref 98–192)

## 2023-09-11 LAB — FERRITIN: Ferritin: 159 ng/mL (ref 11–307)

## 2023-09-11 MED ORDER — HYDROCODONE-ACETAMINOPHEN 10-325 MG PO TABS: 1.0000 | ORAL_TABLET | Freq: Four times a day (QID) | ORAL | 0 refills | Status: AC | PRN

## 2023-09-11 MED ORDER — HYDROCODONE-ACETAMINOPHEN 10-325 MG PO TABS: 1.0000 | ORAL_TABLET | Freq: Four times a day (QID) | ORAL | 0 refills | Status: DC | PRN

## 2023-09-11 NOTE — Progress Notes (Signed)
PROVIDER NOTE: Information contained herein reflects review and annotations entered in association with encounter. Interpretation of such information and data should be left to medically-trained personnel. Information provided to patient can be located elsewhere in the medical record under "Patient Instructions". Document created using STT-dictation technology, any transcriptional errors that may result from process are unintentional.    Patient: Jane [name]  Service Category: E/M  Provider: Edward Jolly, MD  DOB: Mar 15, 1966  DOS: 09/11/2023  Referring Provider: Erasmo Downer, MD  MRN: [id number]  Specialty: Interventional Pain Management  PCP: Erasmo Downer, MD  Type: Established Patient  Setting: Ambulatory outpatient    Location: Office  Delivery: Face-to-face     HPI  [name], a 57 y.o. year old female, is here today because of her Diabetic peripheral neuropathy (HCC) [E11.42]. [name] primary complain today is Back Pain (Lumbar left ), Foot Pain (Peripheral neuropathy ), and Knee Pain (Left scheduled to have gel injections with ortho. )  Pertinent problems: 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 Pain  Assessment: Severity of Chronic pain is reported as a 7 /10. Location: Back Lower, Left/into the left leg to the foot. Onset: More than a month ago. Quality: Discomfort, Constant. Timing: Constant. Modifying factor(s): repositioning, standing vs sitting.  medications. Vitals:  height is 5\' 10"  (1.778 m) and weight is 228 lb (103.4 kg). Her temporal temperature is 98 F (36.7 C). Her blood pressure is 141/63 (abnormal) and her pulse is 64. Her respiration is 16 and oxygen saturation is 95%.  BMI: Estimated body mass index is 32.71 kg/m as calculated from the following:   Height as of this encounter: 5\' 10"  (1.778 m).   Weight as of this encounter: 228 lb (103.4 kg). Last encounter: 06/17/2023. Last procedure: 07/09/2023.  Reason for encounter: medication management.   Patient is experiencing increased bilateral knee pain related to knee osteoarthritis.  Knee x-rays reveal moderate medial compartmental osteoarthritis in the left greater than right knee.  She is awaiting gel injections at an orthopedic clinic but states that it has been many weeks.  She is wondering if she can get them done here.  She would like to repeat Qutenza which she finds beneficial for painful diabetic neuropathic pain of both of her feet Otherwise continue with medication management as prescribed, no change in dose  Pharmacotherapy Assessment  Analgesic: Hydrocodone 10 mg 4 times daily as needed, quantity 120/month; MME equals 40   Monitoring: [place] PMP: PDMP reviewed during this encounter.       Pharmacotherapy: No side-effects or adverse reactions reported. Compliance: No problems identified. Effectiveness: Clinically acceptable.  Jane Ammons, RN  09/11/2023  9:51 AM  Sign when Signing Visit Nursing Pain Medication Assessment:  Safety precautions to be maintained throughout the outpatient stay will include: orient to surroundings, keep bed in low position, maintain call bell within reach at all times, provide  assistance with transfer out of bed  and ambulation.  Medication Inspection Compliance: Pill count conducted under aseptic conditions, in front of the patient. Neither the pills nor the bottle was removed from the patient's sight at any time. Once count was completed pills were immediately returned to the patient in their original bottle.  Medication: Hydrocodone/APAP Pill/Patch Count:  43 of 120 pills remain Pill/Patch Appearance: Markings consistent with prescribed medication Bottle Appearance: Standard pharmacy container. Clearly labeled. Filled Date: 66 / 19 / 2024 Last Medication intake:  Today  No results found for: "CBDTHCR" No results found for: "D8THCCBX" No results found for: "D9THCCBX"  UDS:  Summary  Date Value Ref Range Status  06/17/2023 Note  Final    Comment:    ==================================================================== [facility] 13 (MW) ==================================================================== Test                             Result       Flag       Units  Drug Present and Declared for Prescription Verification   Amphetamine                    2908         EXPECTED   ng/mg creat    Amphetamine is available as a schedule II prescription drug.    Hydrocodone                    1339         EXPECTED   ng/mg creat   Dihydrocodeine                 221          EXPECTED   ng/mg creat   Norhydrocodone                 697          EXPECTED   ng/mg creat    Sources of hydrocodone include scheduled prescription medications.    Dihydrocodeine and norhydrocodone are expected metabolites of    hydrocodone. Dihydrocodeine is also available as a scheduled    prescription medication.  Drug Absent but Declared for Prescription Verification   Alprazolam                     Not Detected UNEXPECTED ng/mg creat ==================================================================== Test                      Result    Flag   Units      Ref Range   Creatinine               61               mg/dL      >=16 ==================================================================== Declared Medications:  The flagging and interpretation on this report are based on the  following declared medications.  Unexpected results may arise from  inaccuracies in the declared medications.   **Note: The testing scope of this panel includes these medications:   Alprazolam (Xanax)  Amphetamine (Adderall)  Hydrocodone (Norco)   **Note: The testing scope of this panel does not include the  following reported medications:   Acetaminophen (Norco)  Aspirin  Atorvastatin (Lipitor)  Cetirizine (Zyrtec)  Estradiol (Estrace)  Famotidine (Pepcid)  Fluoxetine (Prozac)  Ketoconazole (Nizoral)  Levothyroxine (Synthroid)  Linaclotide (Linzess)  Methocarbamol (Robaxin)  Multivitamin  Omega-3 Fatty Acids  Pantoprazole (Protonix)  Polyethylene Glycol (MiraLAX)  Potassium (  Klor-Con)  Pregabalin (Lyrica)  Ropinirole (Requip)  Semaglutide  Supplement  Trazodone (Desyrel)  Valacyclovir (Valtrex)  Vibegron (Gemtesa)  Warfarin (Coumadin) ==================================================================== For clinical consultation, please call [phone]. ====================================================================       ROS  Constitutional: Denies any fever or chills Gastrointestinal: No reported hemesis, hematochezia, vomiting, or acute GI distress Musculoskeletal:  Bilateral knee pain, paresthesias bilateral feet Neurological: No reported episodes of acute onset apraxia, aphasia, dysarthria, agnosia, amnesia, paralysis, loss of coordination, or loss of consciousness  Medication Review  ALPRAZolam, FLUoxetine, Garlic, HYDROcodone-acetaminophen, Semaglutide (2 MG/DOSE), Vibegron, amphetamine-dextroamphetamine, aspirin EC, atorvastatin, cetirizine, estradiol, famotidine, levothyroxine, linaclotide, methocarbamol, multivitamin, nitrofurantoin, omega-3 acid  ethyl esters, pantoprazole, polyethylene glycol powder, potassium chloride, pregabalin, rOPINIRole, traZODone, valACYclovir, and warfarin  History Review  Allergy: Ms. Chiusano is allergic to aripiprazole, seroquel [quetiapine fumarate], chlorpromazine, gabapentin, prednisone, and quetiapine. Drug: Ms. Little  reports no history of drug use. Alcohol:  reports no history of alcohol use. Tobacco:  reports that she has never smoked. She has never used smokeless tobacco. Social: Ms. Myhre  reports that she has never smoked. She has never used smokeless tobacco. She reports that she does not drink alcohol and does not use drugs. Medical:  has a past medical history of ADD (attention deficit disorder), Anxiety, Back pain, Bilateral swelling of feet, Bipolar 1 disorder (HCC), Bipolar 1 disorder (HCC), Bipolar disorder (HCC), Chewing difficulty, Chronic fatigue syndrome, Chronic kidney disease, Constipation, Depression, Diabetes mellitus without complication (HCC), Dyspnea, GERD (gastroesophageal reflux disease), Headache, High cholesterol, History of blood clots, History of DVT (deep vein thrombosis), Hypertension, Hypothyroidism, Joint pain, Neuropathy, Obsessive-compulsive disorder, Peripheral vascular disease (HCC), Prediabetes, Respiratory failure requiring intubation (HCC), Restless leg syndrome, Shortness of breath, Sleep apnea, Thrombocytopenia (HCC) (09/15/2019), Trigger thumb of left hand (01/2018), and Trigger thumb of right hand. Surgical: Ms. Avena  has a past surgical history that includes Cholecystectomy; Trigger finger release (Right, 12/01/2017); Abdominal hysterectomy (06/2016); Lumbar fusion (11/21/2000); Lumbar spine surgery; Trigger finger release (Left, 01/26/2018); LOWER EXTREMITY VENOGRAPHY (N/A, 08/17/2018); Femoral-femoral Bypass Graft (Left, 11/24/2018); AV fistula placement (Left, 11/24/2018); LOWER EXTREMITY VENOGRAPHY (Bilateral, 03/09/2019); Patch angioplasty (Left, 03/30/2019);  Ultrasound guidance for vascular access (Right, 03/30/2019); Femoral artery debridement (03/30/2019); Insertion of iliac stent (03/30/2019); Groin debridement (Left, 04/27/2019); Application if wound vac (Left, 04/27/2019); LOWER EXTREMITY VENOGRAPHY (Left, 08/16/2019); PERIPHERAL VASCULAR INTERVENTION (Left, 08/16/2019); Back surgery; Colonoscopy with propofol (N/A, 11/22/2019); Knee arthroscopy with meniscal repair (Left, 11/14/2020); LOWER EXTREMITY VENOGRAPHY (Left, 03/25/2022); PERIPHERAL VASCULAR THROMBECTOMY (Left, 03/25/2022); PERIPHERAL VASCULAR INTERVENTION (Left, 03/25/2022); and Colonoscopy with propofol (N/A, 12/09/2022). Family: family history includes ADD / ADHD in her daughter; Anxiety disorder in her daughter; Bipolar disorder in her daughter, maternal aunt, and mother; Depression in her mother; Diabetes in her father; Drug abuse in her daughter and daughter; Heart disease in her brother, father, and mother; Hyperlipidemia in her brother, father, and mother; Hypertension in her brother, father, mother, and sister; Obesity in her father and mother; Sleep apnea in her father and mother; Stroke in her mother; Suicidality in her maternal aunt.  Laboratory Chemistry Profile   Renal Lab Results  Component Value Date   BUN 25 (H) 05/09/2023   CREATININE 1.27 (H) 05/09/2023   BCR 20 05/09/2023   GFRAA 53 (L) 07/18/2020   GFRNONAA 45 (L) 01/30/2023    Hepatic Lab Results  Component Value Date   AST 17 05/09/2023   ALT 16 05/09/2023   ALBUMIN 4.3 05/09/2023   ALKPHOS 79 05/09/2023   HCVAB NON REACTIVE 11/06/2021  Electrolytes Lab Results  Component Value Date   NA 142 05/09/2023   K 4.7 05/09/2023   CL 103 05/09/2023   CALCIUM 9.4 05/09/2023   MG 2.1 08/06/2022   PHOS 4.0 04/04/2019    Bone Lab Results  Component Value Date   VD25OH 48.8 03/14/2023    Inflammation (CRP: Acute Phase) (ESR: Chronic Phase) Lab Results  Component Value Date   CRP 11 (H) 02/16/2020   ESRSEDRATE 33  02/16/2020   LATICACIDVEN 1.2 01/05/2020         Note: Above Lab results reviewed.  Recent Imaging Review  CT KNEE LEFT WO CONTRAST CLINICAL DATA:  Left knee pain since a fall in August, 2024.  EXAM: CT OF THE LEFT KNEE WITHOUT CONTRAST  TECHNIQUE: Multidetector CT imaging of the left knee was performed according to the standard protocol. Multiplanar CT image reconstructions were also generated.  RADIATION DOSE REDUCTION: This exam was performed according to the departmental dose-optimization program which includes automated exposure control, adjustment of the mA and/or kV according to patient size and/or use of iterative reconstruction technique.  COMPARISON:  Plain films left knee 03/26/2023.  FINDINGS: Bones/Joint/Cartilage  There is no acute bony or joint abnormality. A very small joint effusion is present. The patient has degenerative disease about the knee with joint space narrowing and osteophytosis worst along the medial joint line. A 0.5 cm in diameter loose body is seen superior to the medial tibial eminence. A second, larger loose body is superior to and osteophyte along the posterior aspect of the medial tibia no chondrocalcinosis.  Ligaments  Suboptimally assessed by CT.  Muscles and Tendons  Intact and normal in appearance.  Soft tissues  Negative.  IMPRESSION: 1. No acute abnormality. 2. Moderate appearing osteoarthritis about the knee is worst about the medial compartment.  Electronically Signed   By: Drusilla Kanner M.D.   On: 09/01/2023 09:29 Note: Reviewed        Physical Exam  General appearance: Well nourished, well developed, and well hydrated. In no apparent acute distress Mental status: Alert, oriented x 3 (person, place, & time)       Respiratory: No evidence of acute respiratory distress Eyes: PERLA Vitals: BP (!) 141/63 (BP Location: Right Arm, Patient Position: Sitting, Cuff Size: Large)   Pulse 64   Temp 98 F (36.7 C)  (Temporal)   Resp 16   Ht 5\' 10"  (1.778 m)   Wt 228 lb (103.4 kg)   LMP  (LMP Unknown)   SpO2 95%   BMI 32.71 kg/m  BMI: Estimated body mass index is 32.71 kg/m as calculated from the following:   Height as of this encounter: 5\' 10"  (1.778 m).   Weight as of this encounter: 228 lb (103.4 kg). Ideal: Ideal body weight: 68.5 kg (151 lb 0.2 oz) Adjusted ideal body weight: 82.5 kg (181 lb 12.9 oz)  Bilateral knee pain, worse with weightbearing  Paresthesias bilateral feet  Assessment   Diagnosis  1. Diabetic peripheral neuropathy (HCC)   2. Chronic bilateral thoracic back pain   3. Failed back surgical syndrome   4. Neurogenic pain   5. Pain management contract signed   6. May-Thurner syndrome   7. Chronic pain syndrome   8. Chronic musculoskeletal pain   9. Bilateral chronic knee pain   10. Bilateral primary osteoarthritis of knee      Updated Problems: No problems updated.  Plan of Care   Ms. Prachi Oftedahl has a current medication list which includes the  following long-term medication(s): amphetamine-dextroamphetamine, amphetamine-dextroamphetamine, amphetamine-dextroamphetamine, atorvastatin, cetirizine, fluoxetine, levothyroxine, linaclotide, pantoprazole, potassium chloride, pregabalin, ropinirole, trazodone, and warfarin.  Pharmacotherapy (Medications Ordered): Meds ordered this encounter  Medications   HYDROcodone-acetaminophen (NORCO) 10-325 MG tablet    Sig: Take 1 tablet by mouth every 6 (six) hours as needed. To last 30 days from fill date    Dispense:  120 tablet    Refill:  0   HYDROcodone-acetaminophen (NORCO) 10-325 MG tablet    Sig: Take 1 tablet by mouth every 6 (six) hours as needed. To last 30 days from fill date    Dispense:  120 tablet    Refill:  0   HYDROcodone-acetaminophen (NORCO) 10-325 MG tablet    Sig: Take 1 tablet by mouth every 6 (six) hours as needed. To last 30 days from fill date    Dispense:  120 tablet    Refill:  0   Orders:   Orders Placed This Encounter  Procedures   KNEE INJECTION    Indications: Knee arthralgia (pain) due to osteoarthritis (OA) Imaging: None (CPT-20610) Position: Sitting Equipment/Materials: Block tray  1.5", 25-G (one per side)  Local anesthetic  Monovisc (one per side) Confirm availability (in office) of Monovisc (HMW hyaluronan)    Standing Status:   Future    Standing Expiration Date:   09/10/2024    Scheduling Instructions:     Procedure: Knee injection Monovisc (Hyaluronan/Hyaluronic acid)     Treatment No.: 1           Level: Intra-articular     Laterality: Bilateral Knee     Sedation: Patient's choice.    Order Specific Question:   Where will this procedure be performed?    Answer:   [facility]   NEUROLYSIS    Please order Qutenza patches from pharmacy    Standing Status:   Future    Standing Expiration Date:   12/12/2023    Order Specific Question:   Where will this procedure be performed?    Answer:   [facility]   Follow-up plan:   Return in about 2 weeks (around 09/25/2023) for B/L knee MONOVISC.      Recent Visits Date Type Provider Dept  07/09/23 Procedure visit Edward Jolly, MD [facility]  06/17/23 Office Visit Edward Jolly, MD [facility]  Showing recent visits within past 90 days and meeting all other requirements Today's Visits Date Type Provider Dept  09/11/23 Office Visit Edward Jolly, MD [facility]  Showing today's visits and meeting all other requirements Future Appointments No visits were found meeting these conditions. Showing future appointments within next 90 days and meeting all other requirements  I discussed the assessment and treatment plan with the patient. The patient was provided an opportunity to ask questions and all were answered. The patient agreed with the plan and demonstrated an understanding of the instructions.  Patient advised to call back or seek an in-person evaluation if  the symptoms or condition worsens.  Duration of encounter: .  Total time on encounter, as per AMA guidelines included both the face-to-face and non-face-to-face time personally spent by the physician and/or other qualified health care professional(s) on the day of the encounter (includes time in activities that require the physician or other qualified health care professional and does not include time in activities normally performed by clinical staff). Physician's time may include the following activities when performed: Preparing to see the patient (e.g., pre-charting review of records, searching for previously ordered  imaging, lab work, and nerve conduction tests) Review of prior analgesic pharmacotherapies. Reviewing PMP Interpreting ordered tests (e.g., lab work, imaging, nerve conduction tests) Performing post-procedure evaluations, including interpretation of diagnostic procedures Obtaining and/or reviewing separately obtained history Performing a medically appropriate examination and/or evaluation Counseling and educating the patient/family/caregiver Ordering medications, tests, or procedures Referring and communicating with other health care professionals (when not separately reported) Documenting clinical information in the electronic or other health record Independently interpreting results (not separately reported) and communicating results to the patient/ family/caregiver Care coordination (not separately reported)  Note by: Edward Jolly, MD Date: 09/11/2023; Time: 10:10 AM

## 2023-09-11 NOTE — Patient Instructions (Signed)
______________________________________________________________________    General Risks and Possible Complications  Patient Responsibilities: It is important that you read this as it is part of your informed consent. It is our duty to inform you of the risks and possible complications associated with treatments offered to you. It is your responsibility as a patient to read this and to ask questions about anything that is not clear or that you believe was not covered in this document.  Patient's Rights: You have the right to refuse treatment. You also have the right to change your mind, even after initially having agreed to have the treatment done. However, under this last option, if you wait until the last second to change your mind, you may be charged for the materials used up to that point.  Introduction: Medicine is not an Visual merchandiser. Everything in Medicine, including the lack of treatment(s), carries the potential for danger, harm, or loss (which is by definition: Risk). In Medicine, a complication is a secondary problem, condition, or disease that can aggravate an already existing one. All treatments carry the risk of possible complications. The fact that a side effects or complications occurs, does not imply that the treatment was conducted incorrectly. It must be clearly understood that these can happen even when everything is done following the highest safety standards.  No treatment: You can choose not to proceed with the proposed treatment alternative. The "PRO(s)" would include: avoiding the risk of complications associated with the therapy. The "CON(s)" would include: not getting any of the treatment benefits. These benefits fall under one of three categories: diagnostic; therapeutic; and/or palliative. Diagnostic benefits include: getting information which can ultimately lead to improvement of the disease or symptom(s). Therapeutic benefits are those associated with the successful  treatment of the disease. Finally, palliative benefits are those related to the decrease of the primary symptoms, without necessarily curing the condition (example: decreasing the pain from a flare-up of a chronic condition, such as incurable terminal cancer).  General Risks and Complications: These are associated to most interventional treatments. They can occur alone, or in combination. They fall under one of the following six (6) categories: no benefit or worsening of symptoms; bleeding; infection; nerve damage; allergic reactions; and/or death. No benefits or worsening of symptoms: In Medicine there are no guarantees, only probabilities. No healthcare provider can ever guarantee that a medical treatment will work, they can only state the probability that it may. Furthermore, there is always the possibility that the condition may worsen, either directly, or indirectly, as a consequence of the treatment. Bleeding: This is more common if the patient is taking a blood thinner, either prescription or over the counter (example: Goody Powders, Fish oil, Aspirin, Garlic, etc.), or if suffering a condition associated with impaired coagulation (example: Hemophilia, cirrhosis of the liver, low platelet counts, etc.). However, even if you do not have one on these, it can still happen. If you have any of these conditions, or take one of these drugs, make sure to notify your treating physician. Infection: This is more common in patients with a compromised immune system, either due to disease (example: diabetes, cancer, human immunodeficiency virus [HIV], etc.), or due to medications or treatments (example: therapies used to treat cancer and rheumatological diseases). However, even if you do not have one on these, it can still happen. If you have any of these conditions, or take one of these drugs, make sure to notify your treating physician. Nerve Damage: This is more common when the treatment is  an invasive one, but it  can also happen with the use of medications, such as those used in the treatment of cancer. The damage can occur to small secondary nerves, or to large primary ones, such as those in the spinal cord and brain. This damage may be temporary or permanent and it may lead to impairments that can range from temporary numbness to permanent paralysis and/or brain death. Allergic Reactions: Any time a substance or material comes in contact with our body, there is the possibility of an allergic reaction. These can range from a mild skin rash (contact dermatitis) to a severe systemic reaction (anaphylactic reaction), which can result in death. Death: In general, any medical intervention can result in death, most of the time due to an unforeseen complication. ______________________________________________________________________      ______________________________________________________________________    Preparing for your procedure  Appointments: If you think you may not be able to keep your appointment, call 24-48 hours in advance to cancel. We need time to make it available to others.  During your procedure appointment there will be: No Prescription Refills. No disability issues to discussed. No medication changes or discussions.  Instructions: Food intake: Avoid eating anything solid for at least 8 hours prior to your procedure. Clear liquid intake: You may take clear liquids such as water up to 2 hours prior to your procedure. (No carbonated drinks. No soda.) Transportation: Unless otherwise stated by your physician, bring a driver. (Driver cannot be a Market researcher, Pharmacist, community, or any other form of public transportation.) Morning Medicines: Except for blood thinners, take all of your other morning medications with a sip of water. Make sure to take your heart and blood pressure medicines. If your blood pressure's lower number is above 100, the case will be rescheduled. Blood thinners: Make sure to stop your blood  thinners as instructed.  If you take a blood thinner, but were not instructed to stop it, call our office [phone] and ask to talk to a nurse. Not stopping a blood thinner prior to certain procedures could lead to serious complications. Diabetics on insulin: Notify the staff so that you can be scheduled 1st case in the morning. If your diabetes requires high dose insulin, take only  of your normal insulin dose the morning of the procedure and notify the staff that you have done so. Preventing infections: Shower with an antibacterial soap the morning of your procedure.  Build-up your immune system: Take 1000 mg of Vitamin C with every meal (3 times a day) the day prior to your procedure. Antibiotics: Inform the nursing staff if you are taking any antibiotics or if you have any conditions that may require antibiotics prior to procedures. (Example: recent joint implants)   Pregnancy: If you are pregnant make sure to notify the nursing staff. Not doing so may result in injury to the fetus, including death.  Sickness: If you have a cold, fever, or any active infections, call and cancel or reschedule your procedure. Receiving steroids while having an infection may result in complications. Arrival: You must be in the facility at least 30 minutes prior to your scheduled procedure. Tardiness: Your scheduled time is also the cutoff time. If you do not arrive at least 15 minutes prior to your procedure, you will be rescheduled.  Children: Do not bring any children with you. Make arrangements to keep them home. Dress appropriately: There is always a possibility that your clothing may get soiled. Avoid long dresses. Valuables: Do not bring any jewelry  or valuables.  Reasons to call and reschedule or cancel your procedure: (Following these recommendations will minimize the risk of a serious complication.) Surgeries: Avoid having procedures within 2 weeks of any surgery. (Avoid for 2 weeks before or after any  surgery). Flu Shots: Avoid having procedures within 2 weeks of a flu shots or . (Avoid for 2 weeks before or after immunizations). Barium: Avoid having a procedure within 7-10 days after having had a radiological study involving the use of radiological contrast. (Myelograms, Barium swallow or enema study). Heart attacks: Avoid any elective procedures or surgeries for the initial 6 months after a "Myocardial Infarction" (Heart Attack). Blood thinners: It is imperative that you stop these medications before procedures. Let us know if you if you take any blood thinner.  Infection: Avoid procedures during or within two weeks of an infection (including chest colds or gastrointestinal problems). Symptoms associated with infections include: Localized redness, fever, chills, night sweats or profuse sweating, burning sensation when voiding, cough, congestion, stuffiness, runny nose, sore throat, diarrhea, nausea, vomiting, cold or Flu symptoms, recent or current infections. It is specially important if the infection is over the area that we intend to treat. Heart and lung problems: Symptoms that may suggest an active cardiopulmonary problem include: cough, chest pain, breathing difficulties or shortness of breath, dizziness, ankle swelling, uncontrolled high or unusually low blood pressure, and/or palpitations. If you are experiencing any of these symptoms, cancel your procedure and contact your primary care physician for an evaluation.  Remember:  Regular Business hours are:  Monday to Thursday 8:00 AM to 4:00 PM  Provider's Schedule: Delano Metz, MD:  Procedure days: Tuesday and Thursday 7:30 AM to 4:00 PM  Edward Jolly, MD:  Procedure days: Monday and Wednesday 7:30 AM to 4:00 PM Last  Updated: 06/24/2023 ______________________________________________________________________    Salvadore Oxford and [facility] discussed.  Verbalizes u/o information

## 2023-09-11 NOTE — Progress Notes (Signed)
Nursing Pain Medication Assessment:  Safety precautions to be maintained throughout the outpatient stay will include: orient to surroundings, keep bed in low position, maintain call bell within reach at all times, provide assistance with transfer out of bed and ambulation.  Medication Inspection Compliance: Pill count conducted under aseptic conditions, in front of the patient. Neither the pills nor the bottle was removed from the patient's sight at any time. Once count was completed pills were immediately returned to the patient in their original bottle.  Medication: Hydrocodone/APAP Pill/Patch Count:  43 of 120 pills remain Pill/Patch Appearance: Markings consistent with prescribed medication Bottle Appearance: Standard pharmacy container. Clearly labeled. Filled Date: 80 / 19 / 2024 Last Medication intake:  Today

## 2023-09-15 NOTE — Telephone Encounter (Signed)
Your request has been approved. Authorization Expiration Date: September 08, 2024.

## 2023-09-17 ENCOUNTER — Ambulatory Visit: Payer: MEDICAID

## 2023-09-17 DIAGNOSIS — Z7901 Long term (current) use of anticoagulants: Secondary | ICD-10-CM

## 2023-09-17 LAB — POCT INR
INR: 2.2 (ref 2.0–3.0)
PT: 26.5

## 2023-09-17 NOTE — Patient Instructions (Signed)
Description   7.5mg  Wednesday and Saturday and 5 mg daily. F/U 4 weeks

## 2023-09-29 ENCOUNTER — Encounter: Payer: Self-pay | Admitting: Student in an Organized Health Care Education/Training Program

## 2023-09-29 ENCOUNTER — Ambulatory Visit
Payer: MEDICAID | Attending: Student in an Organized Health Care Education/Training Program | Admitting: Student in an Organized Health Care Education/Training Program

## 2023-09-29 VITALS — BP 133/73 | HR 61 | Temp 98.1°F | Resp 16 | Ht 70.0 in | Wt 226.0 lb

## 2023-09-29 DIAGNOSIS — M25562 Pain in left knee: Secondary | ICD-10-CM | POA: Insufficient documentation

## 2023-09-29 DIAGNOSIS — M170 Bilateral primary osteoarthritis of knee: Secondary | ICD-10-CM | POA: Insufficient documentation

## 2023-09-29 DIAGNOSIS — G8929 Other chronic pain: Secondary | ICD-10-CM | POA: Diagnosis not present

## 2023-09-29 DIAGNOSIS — M25561 Pain in right knee: Secondary | ICD-10-CM | POA: Insufficient documentation

## 2023-09-29 MED ORDER — LIDOCAINE HCL 2 % IJ SOLN
INTRAMUSCULAR | Status: AC
  Filled 2023-09-29: qty 20

## 2023-09-29 MED ORDER — HYALURONAN 88 MG/4ML IX SOSY
88.0000 mg | PREFILLED_SYRINGE | Freq: Once | INTRA_ARTICULAR | Status: AC
  Administered 2023-09-29: 88 mg via INTRA_ARTICULAR

## 2023-09-29 MED ORDER — LIDOCAINE HCL 2 % IJ SOLN
20.0000 mL | Freq: Once | INTRAMUSCULAR | Status: AC
  Administered 2023-09-29: 400 mg

## 2023-09-29 NOTE — Progress Notes (Signed)
PROVIDER NOTE: Interpretation of information contained herein should be left to medically-trained personnel. Specific patient instructions are provided elsewhere under "Patient Instructions" section of medical record. This document was created in part using STT-dictation technology, any transcriptional errors that may result from this process are unintentional.  Patient: Jane [name] Type: Established DOB: 06-26-1966 MRN: [id number] PCP: Jane Downer, MD  Service: Procedure DOS: 09/29/2023 Setting: Ambulatory Location: Ambulatory outpatient facility Delivery: Face-to-face Provider: Edward Jolly, MD Specialty: Interventional Pain Management Specialty designation: 09 Location: Outpatient facility Ref. Prov.: Jane [name], Jane Schlein, MD       Interventional Therapy   Type:  Monovisc Intra-articular Knee Injection  Laterality: Bilateral (-50) Level/approach: Medial Imaging guidance: None required ([id number]) Anesthesia: Local anesthesia (1-2% Lidocaine) DOS: 09/29/2023  Performed by: Jane Jolly, MD  Purpose: Diagnostic/Therapeutic Indications: Knee arthralgia associated to osteoarthritis of the knee 1. Bilateral chronic knee pain   2. Bilateral primary osteoarthritis of knee    NAS-11 score:   Pre-procedure: 9 /10   Post-procedure: 0-No pain/10     Pre-Procedure Preparation  Monitoring: As per clinic protocol.  Risk Assessment: Vitals:  [id number] body mass index is 32.43 kg/m as calculated from the following:   Height as of this encounter: 5\' 10"  (1.778 m).   Weight as of this encounter: 226 lb (102.5 kg)., Rate:61 , BP:133/73, Resp:16, Temp:98.1 F (36.7 C), SpO2:97 %  Allergies: She is allergic to aripiprazole, seroquel [quetiapine fumarate], chlorpromazine, gabapentin, prednisone, and quetiapine.  Precautions: No additional precautions required  Blood-thinner(s): None at this time  Coagulopathies: Reviewed. None identified.   Active Infection(s): Reviewed.  None identified. Jane [name] is afebrile   Location setting: Exam room Position: Sitting w/ knee bent 90 degrees Safety Precautions: Patient was assessed for positional comfort and pressure points before starting the procedure. Prepping solution: DuraPrep (Iodine Povacrylex [0.7% available iodine] and Isopropyl Alcohol, 74% w/w) Prep Area: Entire knee region Approach: percutaneous, just above the tibial plateau, lateral to the infrapatellar tendon. Intended target: Intra-articular knee space Materials: Tray: Block Needle(s): Regular Qty: 1/side Length: 1.5-inch Gauge: 25G (x1) + 22G (x1)  Meds ordered this encounter  Medications   lidocaine (XYLOCAINE) 2 % (with pres) injection 400 mg   Hyaluronan (MONOVISC) intra-articular injection 88 mg    Do not substitute for formulary alternative without first contacting physician for approval. Deliver to facility day before procedure.   Hyaluronan (MONOVISC) intra-articular injection 88 mg    Do not substitute for formulary alternative without first contacting physician for approval. Deliver to facility day before procedure.    No orders of the defined types were placed in this encounter.    Time-out: 1041 I initiated and conducted the "Time-out" before starting the procedure, as per protocol. The patient was asked to participate by confirming the accuracy of the "Time Out" information. Verification of the correct person, site, and procedure were performed and confirmed by me, the nursing staff, and the patient. "Time-out" conducted as per Joint Commission's Universal Protocol (UP.01.01.01). Procedure checklist: Completed  H&P (Pre-op  Assessment)  Jane [name] is a 57 y.o. (year old), female patient, seen today for interventional treatment. She  has a past surgical history that includes Cholecystectomy; Trigger finger release (Right, 12/01/2017); Abdominal hysterectomy (06/2016); Lumbar fusion (11/21/2000); Lumbar spine surgery; Trigger finger  release (Left, 01/26/2018); LOWER EXTREMITY VENOGRAPHY (N/A, 08/17/2018); Femoral-femoral Bypass Graft (Left, 11/24/2018); AV fistula placement (Left, 11/24/2018); LOWER EXTREMITY VENOGRAPHY (Bilateral, 03/09/2019); Patch angioplasty (Left, 03/30/2019); Ultrasound guidance for vascular access (Right, 03/30/2019); Femoral artery debridement (03/30/2019); Insertion of  iliac stent (03/30/2019); Groin debridement (Left, 04/27/2019); Application if wound vac (Left, 04/27/2019); LOWER EXTREMITY VENOGRAPHY (Left, 08/16/2019); PERIPHERAL VASCULAR INTERVENTION (Left, 08/16/2019); Back surgery; Colonoscopy with propofol (N/A, 11/22/2019); Knee arthroscopy with meniscal repair (Left, 11/14/2020); LOWER EXTREMITY VENOGRAPHY (Left, 03/25/2022); PERIPHERAL VASCULAR THROMBECTOMY (Left, 03/25/2022); PERIPHERAL VASCULAR INTERVENTION (Left, 03/25/2022); and Colonoscopy with propofol (N/A, 12/09/2022). Jane [name] has a current medication list which includes the following prescription(s): alprazolam, amphetamine-dextroamphetamine, amphetamine-dextroamphetamine, amphetamine-dextroamphetamine, aspirin ec, atorvastatin, cetirizine, estradiol, famotidine, fluoxetine, garlic, hydrocodone-acetaminophen, [START ON 10/23/2023] hydrocodone-acetaminophen, [START ON 11/22/2023] hydrocodone-acetaminophen, levothyroxine, linaclotide, methocarbamol, multivitamin, nitrofurantoin, omega-3 acid ethyl esters, pantoprazole, polyethylene glycol powder, potassium chloride, pregabalin, ropinirole, semaglutide (2 mg/dose), trazodone, valacyclovir, gemtesa, and warfarin. Her primarily concern today is the Knee Pain (Bilateral )  She is allergic to aripiprazole, seroquel [quetiapine fumarate], chlorpromazine, gabapentin, prednisone, and quetiapine.   Last encounter: My last encounter with her was on 09/11/2023. 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 Pain Assessment: Severity of Chronic pain is reported as a 9 /10. Location: Knee Right, Left/down the left leg to the foot. Onset: More than a month ago. Quality: Constant, Discomfort, Aching, Other (Comment) (stiff). Timing: Constant. Modifying factor(s): nothing currently. Vitals:  height is 5\' 10"  (1.778 m) and weight is 226 lb (102.5 kg). Her temporal temperature is 98.1 F (36.7 C). Her blood pressure is 133/73 and her pulse is 61. Her respiration is 16 and oxygen saturation is 97%.   Reason for encounter: "interventional pain management therapy due pain of at least four (4) weeks in duration, with failure to respond and/or inability to tolerate more conservative care.  Site Confirmation: Ms. Ekblad was asked to confirm the procedure and laterality before marking the site.  Consent: Before the procedure and under the influence of no sedative(s), amnesic(s), or anxiolytics, the patient was informed of the treatment options, risks and possible complications. To fulfill our ethical and legal obligations, as recommended by the American Medical Association's Code of Ethics, I have informed the patient of my clinical impression; the nature and purpose of the treatment or procedure; the risks, benefits, and possible complications of the intervention;  the alternatives, including doing nothing; the risk(s) and benefit(s) of the alternative treatment(s) or procedure(s); and the risk(s) and benefit(s) of doing nothing. The patient was provided information about the general risks and possible complications associated with the procedure. These may include, but are not limited to: failure to achieve desired goals, infection, bleeding, organ or nerve damage, allergic reactions, paralysis, and death. In addition, the patient was informed of those risks and complications associated to Spine-related procedures, such as failure to decrease pain; infection (i.e.: Meningitis, epidural or intraspinal abscess); bleeding (i.e.: epidural hematoma, subarachnoid hemorrhage, or any other type of intraspinal or peri-dural bleeding); organ or nerve damage (i.e.: Any type of peripheral nerve, nerve root, or spinal cord injury) with subsequent damage to sensory, motor, and/or autonomic systems, resulting in permanent pain, numbness, and/or weakness of  one or several areas of the body; allergic reactions; (i.e.: anaphylactic reaction); and/or death. Furthermore, the patient was informed of those risks and complications associated with the medications. These include, but are not limited to: allergic reactions (i.e.: anaphylactic or anaphylactoid reaction(s)); adrenal axis suppression; blood sugar elevation that in diabetics may result in ketoacidosis or comma; water retention that in patients with history of congestive heart failure may result in shortness of breath, pulmonary edema, and decompensation with resultant heart failure; weight gain; swelling or edema; medication-induced neural toxicity; particulate matter embolism and blood vessel occlusion with resultant organ, and/or nervous system infarction; and/or aseptic necrosis of one or more joints. Finally, the patient was informed that Medicine is not an exact science; therefore, there is also the possibility of unforeseen or  unpredictable risks and/or possible complications that may result in a catastrophic outcome. The patient indicated having understood very clearly. We have given the patient no guarantees and we have made no promises. Enough time was given to the patient to ask questions, all of which were answered to the patient's satisfaction. Ms. Hackenburg has indicated that she wanted to continue with the procedure. Attestation: I, the ordering provider, attest that I have discussed with the patient the benefits, risks, side-effects, alternatives, likelihood of achieving goals, and potential problems during recovery for the procedure that I have provided informed consent.  Date  Time: 09/29/2023 10:17 AM  Description of procedure  Start Time: 1041 hrs  Local Anesthesia: Once the patient was positioned, prepped, and time-out was completed. The target area was identified located. The skin was marked with an approved surgical skin marker. Once marked, the skin (epidermis, dermis, and hypodermis), and deeper tissues (fat, connective tissue and muscle) were infiltrated with a small amount of a short-acting local anesthetic, loaded on a 10cc syringe with a 25G, 1.5-in  Needle. An appropriate amount of time was allowed for local anesthetics to take effect before proceeding to the next step. Local Anesthetic: Lidocaine 1-2% The unused portion of the local anesthetic was discarded in the proper designated containers. Safety Precautions: Aspiration looking for blood return was conducted prior to all injections. At no point did I inject any substances, as a needle was being advanced. Before injecting, the patient was told to immediately notify me if she was experiencing any new onset of "ringing in the ears, or metallic taste in the mouth". No attempts were made at seeking any paresthesias. Safe injection practices and needle disposal techniques used. Medications properly checked for expiration dates. SDV (single dose vial)  medications used. After the completion of the procedure, all disposable equipment used was discarded in the proper designated medical waste containers.  Technical description: Protocol guidelines were followed. After positioning, the target area was identified and prepped in the usual manner. Skin & deeper tissues infiltrated with local anesthetic. Appropriate amount of time allowed to pass for local anesthetics to take effect. Proper needle placement secured. Once satisfactory needle placement was confirmed, I proceeded to inject the desired solution in slow, incremental fashion, intermittently assessing for discomfort or any signs of abnormal or undesired spread of substance. Once completed, the needle was removed and disposed of, as per hospital protocols. The area was cleaned, making sure to leave some of the prepping solution back to take advantage of its long term bactericidal properties.  Aspiration:  Negative        Vitals:   09/29/23 1018  BP: 133/73  Pulse: 61  Resp: 16  Temp: 98.1 F (36.7 C)  TempSrc: Temporal  SpO2: 97%  Weight: 226 lb (102.5 kg)  Height: 5\' 10"  (1.778 m)    End Time: 1045 hrs  Imaging guidance  Imaging-assisted Technique: None required. Indication(s): N/A Exposure Time: N/A Contrast: None Fluoroscopic Guidance: N/A Ultrasound Guidance: N/A Interpretation: N/A  Post-op assessment  Post-procedure Vital Signs:  Pulse/HCG Rate: 61  Temp: 98.1 F (36.7 C) Resp: 16 BP: 133/73 SpO2: 97 %  EBL: None  Complications: No immediate post-treatment complications observed by team, or reported by patient.  Note: The patient tolerated the entire procedure well. A repeat set of vitals were taken after the procedure and the patient was kept under observation following institutional policy, for this type of procedure. Post-procedural neurological assessment was performed, showing return to baseline, prior to discharge. The patient was provided with  post-procedure discharge instructions, including a section on how to identify potential problems. Should any problems arise concerning this procedure, the patient was given instructions to immediately contact us, at any time, without hesitation. In any case, we plan to contact the patient by telephone for a follow-up status report regarding this interventional procedure.  Comments:  No additional relevant information.  Plan of care  Chronic Opioid Analgesic:  Hydrocodone 10 mg 4 times daily as needed, quantity 120/month; MME equals 40   Medications administered: We administered lidocaine, Hyaluronan, and Hyaluronan.  Follow-up plan:   Return for Keep sch. appt.     Recent Visits Date Type Provider Dept  09/11/23 Office Visit Jane Jolly, MD [facility]  07/09/23 Procedure visit Jane Jolly, MD [facility]  Showing recent visits within past 90 days and meeting all other requirements Today's Visits Date Type Provider Dept  09/29/23 Procedure visit Jane Jolly, MD [facility]  Showing today's visits and meeting all other requirements Future Appointments Date Type Provider Dept  10/13/23 Appointment Jane Jolly, MD [facility]  12/09/23 Appointment Jane Jolly, MD [facility]  Showing future appointments within next 90 days and meeting all other requirements   Disposition: Discharge home  Discharge (Date  Time): 09/29/2023; 1100 hrs.   Primary Care Physician: Jane Downer, MD Location: [facility] Note by: Jane Jolly, MD Date: 09/29/2023; Time: 11:04 AM  DISCLAIMER: Medicine is not an exact science. It has no guarantees or warranties. The decision to proceed with this intervention was based on the information collected from the patient. Conclusions were drawn from the patient's questionnaire, interview, and examination. Because information was provided in large part by the patient, it  cannot be guaranteed that it has not been purposely or unconsciously manipulated or altered. Every effort has been made to obtain as much accurate, relevant, available data as possible. Always take into account that the treatment will also be dependent on availability of resources and existing treatment guidelines, considered by other Pain Management Specialists as being common knowledge and practice, at the time of the intervention. It is also important to point out that variation in procedural techniques and pharmacological choices are the acceptable norm. For Medico-Legal review purposes, the indications, contraindications, technique, and results of the these procedures should only be evaluated, judged and interpreted by a Board-Certified Interventional Pain Specialist with extensive familiarity and expertise in the same exact procedure and technique.

## 2023-09-29 NOTE — Progress Notes (Signed)
Safety precautions to be maintained throughout the outpatient stay will include: orient to surroundings, keep bed in low position, maintain call bell within reach at all times, provide assistance with transfer out of bed and ambulation.  

## 2023-09-29 NOTE — Patient Instructions (Signed)

## 2023-09-30 ENCOUNTER — Telehealth: Payer: Self-pay

## 2023-09-30 ENCOUNTER — Other Ambulatory Visit (HOSPITAL_COMMUNITY): Payer: Self-pay | Admitting: Psychiatry

## 2023-09-30 NOTE — Telephone Encounter (Signed)
Post procedure follow up.  LM 

## 2023-10-06 ENCOUNTER — Encounter: Payer: Self-pay | Admitting: Family Medicine

## 2023-10-06 ENCOUNTER — Ambulatory Visit: Payer: MEDICAID | Admitting: Family Medicine

## 2023-10-06 VITALS — BP 133/74 | HR 55 | Resp 16 | Ht 70.0 in | Wt 238.5 lb

## 2023-10-06 DIAGNOSIS — M545 Low back pain, unspecified: Secondary | ICD-10-CM

## 2023-10-06 DIAGNOSIS — W19XXXA Unspecified fall, initial encounter: Secondary | ICD-10-CM

## 2023-10-06 DIAGNOSIS — J0100 Acute maxillary sinusitis, unspecified: Secondary | ICD-10-CM | POA: Diagnosis not present

## 2023-10-06 DIAGNOSIS — I152 Hypertension secondary to endocrine disorders: Secondary | ICD-10-CM

## 2023-10-06 DIAGNOSIS — R300 Dysuria: Secondary | ICD-10-CM | POA: Diagnosis not present

## 2023-10-06 DIAGNOSIS — E1159 Type 2 diabetes mellitus with other circulatory complications: Secondary | ICD-10-CM

## 2023-10-06 LAB — POCT URINALYSIS DIPSTICK
Bilirubin, UA: NEGATIVE
Glucose, UA: NEGATIVE
Ketones, UA: NEGATIVE
Leukocytes, UA: NEGATIVE
Nitrite, UA: NEGATIVE
Protein, UA: NEGATIVE
Spec Grav, UA: 1.02 (ref 1.010–1.025)
Urobilinogen, UA: 0.2 U/dL
pH, UA: 6 (ref 5.0–8.0)

## 2023-10-06 MED ORDER — DOXYCYCLINE HYCLATE 100 MG PO TABS: 100.0000 mg | ORAL_TABLET | Freq: Two times a day (BID) | ORAL | 0 refills | Status: AC

## 2023-10-06 MED ORDER — LISINOPRIL 20 MG PO TABS: 20.0000 mg | ORAL_TABLET | Freq: Every day | ORAL | 3 refills | Status: DC

## 2023-10-06 NOTE — Progress Notes (Signed)
Established Patient Office Visit  Subjective   Patient ID: Jane [name], female    DOB: 06-18-1966  Age: 57 y.o. MRN: [id number]   Chief Complaint  Patient presents with   Dysuria    Possible UTI   Medical Management of Chronic Issues    Hypertension follow-up.   URI    Dysuria   URI  Associated symptoms include dysuria.    Discussed the use of AI scribe software for clinical note transcription with the patient, who gave verbal consent to proceed.  History of Present Illness   The patient, with a history of recurring UTIs and hypertension, presents with multiple complaints. They initially scheduled the appointment for a blood pressure check as it has been running high according to other doctors. Over the past weeks, the patient has developed additional symptoms. They report a fall over a dog, resulting in a bruised and swollen back and a hurt wrist. The patient also complains of lower back pain, especially on the right side, which was present before the fall. They are unsure if this is related to their recurring UTIs, but they are currently on a three-month course of antibiotics for the UTIs. The patient also reports sinus congestion, with symptoms alternating between stuffiness and runniness. They have been experiencing this for about two weeks and also report a stiff neck and headaches.         Review of Systems  Genitourinary:  Positive for dysuria.      Objective:     BP 133/74 (BP Location: Right Arm, Patient Position: Sitting, Cuff Size: Large)   Pulse (!) 55   Resp 16   Ht 5\' 10"  (1.778 m)   Wt 238 lb 8 oz (108.2 kg)   LMP  (LMP Unknown)   BMI 34.22 kg/m    Physical Exam Vitals reviewed.  Constitutional:      General: She is not in acute distress.    Appearance: Normal appearance. She is well-developed. She is not diaphoretic.  HENT:     Head: Normocephalic and atraumatic.  Eyes:     General: No scleral icterus.    Conjunctiva/sclera: Conjunctivae  normal.  Neck:     Thyroid: No thyromegaly.  Cardiovascular:     Rate and Rhythm: Normal rate and regular rhythm.     Heart sounds: Normal heart sounds. No murmur heard. Pulmonary:     Effort: Pulmonary effort is normal. No respiratory distress.     Breath sounds: Normal breath sounds. No wheezing, rhonchi or rales.  Musculoskeletal:     Cervical back: Neck supple.     Right lower leg: No edema.     Left lower leg: No edema.     Comments: Back: No midline TTP, ROM grossly intact.  Mild TTP over R lower back/SI joint. Negative SLR bilaterally.  Strength and sensation to light touch intact in lower extremities.    Lymphadenopathy:     Cervical: No cervical adenopathy.  Skin:    General: Skin is warm and dry.     Findings: No rash.  Neurological:     Mental Status: She is alert and oriented to person, place, and time. Mental status is at baseline.  Psychiatric:        Mood and Affect: Mood normal.        Behavior: Behavior normal.      Results for orders placed or performed in visit on 10/06/23  POCT urinalysis dipstick  Result Value Ref Range   Color, UA Yellow  Clarity, UA clody    Glucose, UA Negative Negative   Bilirubin, UA Negative    Ketones, UA Negative    Spec Grav, UA 1.020 1.010 - 1.025   Blood, UA small    pH, UA 6.0 5.0 - 8.0   Protein, UA Negative Negative   Urobilinogen, UA 0.2 0.2 or 1.0 E.U./dL   Nitrite, UA Negative    Leukocytes, UA Negative Negative   Appearance     Odor        The 10-year ASCVD risk score (Arnett DK, et al., 2019) is: 6.8%    Assessment & Plan:   Problem List Items Addressed This Visit       Cardiovascular and Mediastinum   Hypertension associated with diabetes (HCC) (Chronic)    Blood pressure has been elevated over the past few months with readings in the 150s/70s. Previously managed with HCTZ and lisinopril, which were discontinued due to low readings. Weight gain and decreased activity may be contributing factors.  Discussed restarting lisinopril 20 mg daily to manage blood pressure. Future weight loss and increased activity may allow for discontinuation of medication again. - Restart lisinopril 20 mg daily - Follow up next month for physical and blood pressure check      Relevant Medications   lisinopril (ZESTRIL) 20 MG tablet     Other   Fall   Other Visit Diagnoses     Dysuria    -  Primary   Relevant Orders   POCT urinalysis dipstick (Completed)   Acute non-recurrent maxillary sinusitis       Relevant Medications   doxycycline (VIBRA-TABS) 100 MG tablet   Acute right-sided low back pain without sciatica               Sinusitis Two-week history of nasal congestion, facial pressure, and headaches suggestive of bacterial sinus infection. Current antibiotic (nitrofurantoin) for UTI does not cover sinus pathogens. Discussed prescribing doxycycline for 7 days, noting it has been well-tolerated in the past and is less likely to interfere with Coumadin than some alternatives. - Prescribe doxycycline for 7 days - Check INR next Wednesday to monitor Coumadin interaction  Musculoskeletal Back Pain Lower back pain, primarily on the right side, exacerbated by a recent fall. Pain is likely musculoskeletal in nature, as urine analysis is negative for UTI indicators. Bruising and swelling noted on examination. Discussed muscle relaxant options and chiropractic care with caution due to potential risks of aggressive neck manipulation. - Pt states she will discuss muscle relaxant options with Dr. Cherylann Ratel - Consider chiropractic care with caution (patient brings up this idea)  General Health Maintenance Routine follow-up and health maintenance discussed. - Schedule physical exam next month - Perform labs at next month's appointment  Follow-up - Follow up next month for physical and blood pressure check - Check INR next Wednesday.        Return in about 4 weeks (around 11/03/2023) for CPE, as  scheduled.    Shirlee Latch, MD

## 2023-10-06 NOTE — Assessment & Plan Note (Signed)
Blood pressure has been elevated over the past few months with readings in the 150s/70s. Previously managed with HCTZ and lisinopril, which were discontinued due to low readings. Weight gain and decreased activity may be contributing factors. Discussed restarting lisinopril 20 mg daily to manage blood pressure. Future weight loss and increased activity may allow for discontinuation of medication again. - Restart lisinopril 20 mg daily - Follow up next month for physical and blood pressure check

## 2023-10-07 ENCOUNTER — Ambulatory Visit (INDEPENDENT_AMBULATORY_CARE_PROVIDER_SITE_OTHER): Payer: MEDICAID | Admitting: Family Medicine

## 2023-10-07 ENCOUNTER — Encounter (INDEPENDENT_AMBULATORY_CARE_PROVIDER_SITE_OTHER): Payer: Self-pay | Admitting: Family Medicine

## 2023-10-07 ENCOUNTER — Encounter: Payer: Self-pay | Admitting: Family Medicine

## 2023-10-07 VITALS — BP 107/64 | HR 60 | Temp 97.9°F | Ht 68.0 in | Wt 229.0 lb

## 2023-10-07 DIAGNOSIS — Z7985 Long-term (current) use of injectable non-insulin antidiabetic drugs: Secondary | ICD-10-CM

## 2023-10-07 DIAGNOSIS — E669 Obesity, unspecified: Secondary | ICD-10-CM

## 2023-10-07 DIAGNOSIS — E1165 Type 2 diabetes mellitus with hyperglycemia: Secondary | ICD-10-CM | POA: Diagnosis not present

## 2023-10-07 DIAGNOSIS — I152 Hypertension secondary to endocrine disorders: Secondary | ICD-10-CM | POA: Diagnosis not present

## 2023-10-07 DIAGNOSIS — Z6834 Body mass index (BMI) 34.0-34.9, adult: Secondary | ICD-10-CM

## 2023-10-07 DIAGNOSIS — E1159 Type 2 diabetes mellitus with other circulatory complications: Secondary | ICD-10-CM

## 2023-10-07 NOTE — Progress Notes (Signed)
229 lb   SUBJECTIVE:  Chief Complaint: Obesity  Interim History: Patient had a quiet Thanksgiving at home.  She got a gel injection in her knee.  She is hoping to workout now that her knee pain is improved.  She was restarted on lisinopril yesterday by PCP. She has been following the Category 3 only 50% of the time.  Food is too expensive to afford al the food on plan.  Food recalls are also making her weary to eat fresh produce.   [name] is here to discuss her progress with her obesity treatment plan. She is on the Category 3 Plan and states she is following her eating plan approximately 50 % of the time. She states she is walking 3,000 steps 7 times per week.   OBJECTIVE: Visit Diagnoses: Problem List Items Addressed This Visit       Cardiovascular and Mediastinum   Hypertension associated with diabetes (HCC) - Primary (Chronic)    Blood pressure controlled today but has been labile previously.  She was started on lisinopril yesterday by PCP.  She denies chest pain, chest pressure, headache.  She will continue lisinopril at this dose and we will follow up with BP at next appointment.         Endocrine   Type 2 diabetes mellitus with hyperglycemia (HCC)    On semaglutide weekly.  She is not experiencing any GI side effects.  She will log food a couple of times a week to get a better idea of food intake. Goal is 4 days a week for the next 5-6 weeks.       Vitals Temp: 97.9 F (36.6 C) BP: 107/64 Pulse Rate: 60 SpO2: 99 %   Anthropometric Measurements Height: 5\' 8"  (1.727 m) Weight: 229 lb (103.9 kg) BMI (Calculated): 34.83 Weight at Last Visit: 228 lb Weight Lost Since Last Visit: 0 Weight Gained Since Last Visit: 1 Starting Weight: 283 lb Total Weight Loss (lbs): 54 lb (24.5 kg)   Body Composition  Body Fat %: 47.5 % Fat Mass (lbs): 108.8 lbs Muscle Mass (lbs): 114.4 lbs Total Body Water (lbs): 86.2 lbs Visceral Fat Rating : 13   Other Clinical Data Today's  Visit #: 5 Starting Date: 03/29/20     ASSESSMENT AND PLAN:  Diet: Doniqua is currently in the action stage of change. As such, her goal is to continue with weight loss efforts. She has agreed to Category 3 Plan or journaling 1450-1600 calories with a goal of 95 or more grams of protein. Patient to start food log or journaling meal plan.  The initial goal will be to habitually log or journal for at least 4 days a week.  The expectation it that patient may not initially meet calorie or protein goals as the nturitional understanding of food intake is begun.  We discussed the 10:1 ratio when reading a food label.  Patient agrees to keep a food log either electronically or on paper and bring to the next appointment to be able to dissect and discuss it with provider.    Exercise: Yalitza has been instructed that some exercise is better than none for weight loss and overall health benefits.   Behavior Modification:  We discussed the following Behavioral Modification Strategies today: increasing lean protein intake, increasing vegetables, meal planning and cooking strategies, and better snacking choices.   No follow-ups on file.Marland Kitchen She was informed of the importance of frequent follow up visits to maximize her success with intensive lifestyle modifications for her  multiple health conditions.  Attestation Statements:   Reviewed by clinician on day of visit: allergies, medications, problem list, medical history, surgical history, family history, social history, and previous encounter notes.  Reuben Likes, MD

## 2023-10-07 NOTE — Assessment & Plan Note (Signed)
On semaglutide weekly.  She is not experiencing any GI side effects.  She will log food a couple of times a week to get a better idea of food intake. Goal is 4 days a week for the next 5-6 weeks.

## 2023-10-07 NOTE — Assessment & Plan Note (Signed)
Blood pressure controlled today but has been labile previously.  She was started on lisinopril yesterday by PCP.  She denies chest pain, chest pressure, headache.  She will continue lisinopril at this dose and we will follow up with BP at next appointment.

## 2023-10-13 ENCOUNTER — Ambulatory Visit: Payer: MEDICAID | Admitting: Urology

## 2023-10-13 ENCOUNTER — Encounter: Payer: Self-pay | Admitting: Urology

## 2023-10-13 ENCOUNTER — Ambulatory Visit
Payer: MEDICAID | Attending: Student in an Organized Health Care Education/Training Program | Admitting: Student in an Organized Health Care Education/Training Program

## 2023-10-13 ENCOUNTER — Encounter: Payer: Self-pay | Admitting: Student in an Organized Health Care Education/Training Program

## 2023-10-13 VITALS — BP 126/73 | HR 64 | Temp 98.2°F | Resp 18 | Ht 70.0 in | Wt 228.0 lb

## 2023-10-13 VITALS — BP 121/71 | HR 61

## 2023-10-13 DIAGNOSIS — M25562 Pain in left knee: Secondary | ICD-10-CM | POA: Diagnosis present

## 2023-10-13 DIAGNOSIS — M170 Bilateral primary osteoarthritis of knee: Secondary | ICD-10-CM | POA: Diagnosis present

## 2023-10-13 DIAGNOSIS — Z87440 Personal history of urinary (tract) infections: Secondary | ICD-10-CM

## 2023-10-13 DIAGNOSIS — R3129 Other microscopic hematuria: Secondary | ICD-10-CM

## 2023-10-13 DIAGNOSIS — G8929 Other chronic pain: Secondary | ICD-10-CM | POA: Insufficient documentation

## 2023-10-13 DIAGNOSIS — N3020 Other chronic cystitis without hematuria: Secondary | ICD-10-CM

## 2023-10-13 DIAGNOSIS — M25561 Pain in right knee: Secondary | ICD-10-CM | POA: Insufficient documentation

## 2023-10-13 DIAGNOSIS — E1142 Type 2 diabetes mellitus with diabetic polyneuropathy: Secondary | ICD-10-CM | POA: Diagnosis present

## 2023-10-13 LAB — URINALYSIS, COMPLETE
Bilirubin, UA: NEGATIVE
Glucose, UA: NEGATIVE
Ketones, UA: NEGATIVE
Leukocytes,UA: NEGATIVE
Nitrite, UA: NEGATIVE
Protein,UA: NEGATIVE
Specific Gravity, UA: 1.02 (ref 1.005–1.030)
Urobilinogen, Ur: 0.2 mg/dL (ref 0.2–1.0)
pH, UA: 6 (ref 5.0–7.5)

## 2023-10-13 LAB — MICROSCOPIC EXAMINATION: Epithelial Cells (non renal): >10 /[HPF] — AB (ref 0–10)

## 2023-10-13 MED ORDER — CAPSAICIN-CLEANSING GEL 8 % EX KIT
4.0000 | PACK | Freq: Once | CUTANEOUS | Status: DC
Start: 2023-10-13 — End: 2023-10-13
  Filled 2023-10-13: qty 4

## 2023-10-13 NOTE — Patient Instructions (Signed)
 ______________________________________________________________________    Preparing for your procedure  Appointments: If you think you may not be able to keep your appointment, call 24-48 hours in advance to cancel. We need time to make it available to others.  During your procedure appointment there will be: No Prescription Refills. No disability issues to discussed. No medication changes or discussions.  Instructions: Food intake: Avoid eating anything solid for at least 8 hours prior to your procedure. Clear liquid intake: You may take clear liquids such as water up to 2 hours prior to your procedure. (No carbonated drinks. No soda.) Transportation: Unless otherwise stated by your physician, bring a driver. (Driver cannot be a Market researcher, Pharmacist, community, or any other form of public transportation.) Morning Medicines: Except for blood thinners, take all of your other morning medications with a sip of water. Make sure to take your heart and blood pressure medicines. If your blood pressure's lower number is above 100, the case will be rescheduled. Blood thinners: Make sure to stop your blood thinners as instructed.  If you take a blood thinner, but were not instructed to stop it, call our office [phone] and ask to talk to a nurse. Not stopping a blood thinner prior to certain procedures could lead to serious complications. Diabetics on insulin: Notify the staff so that you can be scheduled 1st case in the morning. If your diabetes requires high dose insulin, take only  of your normal insulin dose the morning of the procedure and notify the staff that you have done so. Preventing infections: Shower with an antibacterial soap the morning of your procedure.  Build-up your immune system: Take 1000 mg of Vitamin C with every meal (3 times a day) the day prior to your procedure. Antibiotics: Inform the nursing staff if you are taking any antibiotics or if you have any conditions that may require antibiotics  prior to procedures. (Example: recent joint implants)   Pregnancy: If you are pregnant make sure to notify the nursing staff. Not doing so may result in injury to the fetus, including death.  Sickness: If you have a cold, fever, or any active infections, call and cancel or reschedule your procedure. Receiving steroids while having an infection may result in complications. Arrival: You must be in the facility at least 30 minutes prior to your scheduled procedure. Tardiness: Your scheduled time is also the cutoff time. If you do not arrive at least 15 minutes prior to your procedure, you will be rescheduled.  Children: Do not bring any children with you. Make arrangements to keep them home. Dress appropriately: There is always a possibility that your clothing may get soiled. Avoid long dresses. Valuables: Do not bring any jewelry or valuables.  Reasons to call and reschedule or cancel your procedure: (Following these recommendations will minimize the risk of a serious complication.) Surgeries: Avoid having procedures within 2 weeks of any surgery. (Avoid for 2 weeks before or after any surgery). Flu Shots: Avoid having procedures within 2 weeks of a flu shots or . (Avoid for 2 weeks before or after immunizations). Barium: Avoid having a procedure within 7-10 days after having had a radiological study involving the use of radiological contrast. (Myelograms, Barium swallow or enema study). Heart attacks: Avoid any elective procedures or surgeries for the initial 6 months after a "Myocardial Infarction" (Heart Attack). Blood thinners: It is imperative that you stop these medications before procedures. Let us know if you if you take any blood thinner.  Infection: Avoid procedures during or within  two weeks of an infection (including chest colds or gastrointestinal problems). Symptoms associated with infections include: Localized redness, fever, chills, night sweats or profuse sweating, burning sensation  when voiding, cough, congestion, stuffiness, runny nose, sore throat, diarrhea, nausea, vomiting, cold or Flu symptoms, recent or current infections. It is specially important if the infection is over the area that we intend to treat. Heart and lung problems: Symptoms that may suggest an active cardiopulmonary problem include: cough, chest pain, breathing difficulties or shortness of breath, dizziness, ankle swelling, uncontrolled high or unusually low blood pressure, and/or palpitations. If you are experiencing any of these symptoms, cancel your procedure and contact your primary care physician for an evaluation.  Remember:  Regular Business hours are:  Monday to Thursday 8:00 AM to 4:00 PM  Provider's Schedule: Delano Metz, MD:  Procedure days: Tuesday and Thursday 7:30 AM to 4:00 PM  Edward Jolly, MD:  Procedure days: Monday and Wednesday 7:30 AM to 4:00 PM Last  Updated: 06/24/2023 ______________________________________________________________________

## 2023-10-13 NOTE — Progress Notes (Signed)
10/13/2023 10:05 AM   Jane [name] [birth year] [id number]  Referring provider: Erasmo Downer, MD [address] [place],  [place] [address]  Chief Complaint  Patient presents with   Follow-up   Over Active Bladder    HPI: Br: Chronic renal insufficiency overactive bladder and urinary tract infections.  Was on Gemtesa and vaginal estrogen cream.  Cystoscopy November 2022.  Has had improvement on Myrbetriq in the past.  Recently saw a Publishing rights manager on [place].  Having ongoing incontinence.  In May 2024 patient had a UTI with a positive culture.  He is on CPAP.  She was doing well on the [place] but the effect may have worn off.  Has had a normal CT scan.  Was worked up for microscopic hematuria in the past.  Was started on trimethoprim hoping it would down regulate her incontinence.  When she saw a nurse practitioner in June she had worsening incontinence and a urinary tract infection and the culture was positive   Today Patient thinks she may have a bladder infection now with burning.  She said when she gets infected she has frequency urge incontinence and worsening nocturia.  She says when she is not infected she can void every 2 hours and get up once a night and that she is continent.  She repeated that when she is not infected she does not think she leaks.   Never filled trimethoprim due to lack of approval.  I do not think she could afford the [facility] but she said it works very well when she was not infected.  Using estrogen twice a week.   Still has right sided flank discomfort that comes and goes been discussed before  Patient understands that we want to control UTIs first.  Reassess in 3 months and daily Macrodantin 100 mg 30 x 11.  Flank discomfort not due to kidneys.  Reassess incontinence at that time.   Today Frequency stable.  Last culture positive.  No infections.  She is continent.  Very pleased.   PMH: Past Medical History:  Diagnosis Date   ADD  (attention deficit disorder)    Anxiety    Back pain    Bilateral swelling of feet    Bipolar 1 disorder (HCC)    Bipolar 1 disorder (HCC)    Bipolar disorder (HCC)    Chewing difficulty    Chronic fatigue syndrome    Chronic kidney disease    Stage 3 kidney disease;dx by Dr. Kari Baars.    Constipation    Depression    Diabetes mellitus without complication (HCC)    diet controlled   Dyspnea    with exertion   GERD (gastroesophageal reflux disease)    Headache    migraines   High cholesterol    History of blood clots    History of DVT (deep vein thrombosis)    left leg   Hypertension    states under control with meds., has been on med. x 2 years   Hypothyroidism    Joint pain    Neuropathy    Obsessive-compulsive disorder    Peripheral vascular disease (HCC)    Prediabetes    Respiratory failure requiring intubation (HCC)    Restless leg syndrome    Shortness of breath    Sleep apnea    Thrombocytopenia (HCC) 09/15/2019   Trigger thumb of left hand 01/2018   Trigger thumb of right hand     Surgical History: Past Surgical History:  Procedure  Laterality Date   ABDOMINAL HYSTERECTOMY  06/2016   complete   APPLICATION OF WOUND VAC Left 04/27/2019   Procedure: APPLICATION OF WOUND VAC LEFT GROIN;  Surgeon: Maeola Harman, MD;  Location: [facility] OR;  Service: Vascular;  Laterality: Left;   AV FISTULA PLACEMENT Left 11/24/2018   Procedure: ARTERIOVENOUS (AV) FISTULA CREATION LEFT SFA TO LEFT FEMORAL VEIN;  Surgeon: Maeola Harman, MD;  Location: [facility] OR;  Service: Vascular;  Laterality: Left;   BACK SURGERY     CHOLECYSTECTOMY     COLONOSCOPY WITH PROPOFOL N/A 11/22/2019   Procedure: COLONOSCOPY WITH PROPOFOL;  Surgeon: Wyline Mood, MD;  Location: [facility] ENDOSCOPY;  Service: Gastroenterology;  Laterality: N/A;   COLONOSCOPY WITH PROPOFOL N/A 12/09/2022   Procedure: COLONOSCOPY WITH PROPOFOL;  Surgeon: Wyline Mood, MD;  Location: [facility] ENDOSCOPY;  Service:  Gastroenterology;  Laterality: N/A;   FEMORAL ARTERY EXPLORATION  03/30/2019   Procedure: Left Common Femoral Artery and Vein Exploration;  Surgeon: Maeola Harman, MD;  Location: [facility] OR;  Service: Vascular;;   FEMORAL-FEMORAL BYPASS GRAFT Left 11/24/2018   Procedure: BYPASS GRAFT FEMORAL-FEMORAL VENOUS LEFT TO RIGHT PALMA PROCEDURE USING CRYOVEIN;  Surgeon: Maeola Harman, MD;  Location: [facility] OR;  Service: Vascular;  Laterality: Left;   GROIN DEBRIDEMENT Left 04/27/2019   Procedure: GROIN DEBRIDEMENT;  Surgeon: Maeola Harman, MD;  Location: [facility] OR;  Service: Vascular;  Laterality: Left;   INSERTION OF ILIAC STENT  03/30/2019   Procedure: Stent of left common, external iliac veins and left common femoral vein;  Surgeon: Maeola Harman, MD;  Location: [facility] OR;  Service: Vascular;;   KNEE ARTHROSCOPY WITH MENISCAL REPAIR Left 11/14/2020   Procedure: LEFT KNEE ARTHROSCOPY WITH PARTIAL MEDIAL MENISCECTOMY;  Surgeon: Vickki Hearing, MD;  Location: [facility] ORS;  Service: Orthopedics;  Laterality: Left;   LOWER EXTREMITY VENOGRAPHY N/A 08/17/2018   Procedure: LOWER EXTREMITY VENOGRAPHY - Central Venogram;  Surgeon: Maeola Harman, MD;  Location: [facility] INVASIVE CV LAB;  Service: Cardiovascular;  Laterality: N/A;   LOWER EXTREMITY VENOGRAPHY Bilateral 03/09/2019   Procedure: LOWER EXTREMITY VENOGRAPHY;  Surgeon: Maeola Harman, MD;  Location: [facility] INVASIVE CV LAB;  Service: Cardiovascular;  Laterality: Bilateral;   LOWER EXTREMITY VENOGRAPHY Left 08/16/2019   Procedure: LOWER EXTREMITY VENOGRAPHY;  Surgeon: Maeola Harman, MD;  Location: [facility] INVASIVE CV LAB;  Service: Cardiovascular;  Laterality: Left;   LOWER EXTREMITY VENOGRAPHY Left 03/25/2022   Procedure: LOWER EXTREMITY VENOGRAPHY;  Surgeon: Maeola Harman, MD;  Location: [facility] INVASIVE CV LAB;  Service: Cardiovascular;  Laterality: Left;  IVUS   LUMBAR FUSION  11/21/2000   L5-S1    LUMBAR SPINE SURGERY     x 2 others   PATCH ANGIOPLASTY Left 03/30/2019   Procedure: Patch Angioplasty of the Left Common Femoral Vein using Venosure Biologic patch;  Surgeon: Maeola Harman, MD;  Location: [facility] OR;  Service: Vascular;  Laterality: Left;   PERIPHERAL VASCULAR INTERVENTION Left 08/16/2019   Procedure: PERIPHERAL VASCULAR INTERVENTION;  Surgeon: Maeola Harman, MD;  Location: [facility] INVASIVE CV LAB;  Service: Cardiovascular;  Laterality: Left;  common femoral/femoral vein stent   PERIPHERAL VASCULAR INTERVENTION Left 03/25/2022   Procedure: PERIPHERAL VASCULAR INTERVENTION;  Surgeon: Maeola Harman, MD;  Location: [facility] INVASIVE CV LAB;  Service: Cardiovascular;  Laterality: Left;  COMMON FEMORAL VEIN   PERIPHERAL VASCULAR THROMBECTOMY Left 03/25/2022   Procedure: PERIPHERAL VASCULAR THROMBECTOMY;  Surgeon: Maeola Harman, MD;  Location: [facility] INVASIVE CV LAB;  Service: Cardiovascular;  Laterality: Left;  EXTREMITY/IVC   TRIGGER FINGER RELEASE Right 12/01/2017   Procedure: RELEASE TRIGGER FINGER/A-1 PULLEY RIGHT THUMB;  Surgeon: Betha Loa, MD;  Location: [facility];  Service: Orthopedics;  Laterality: Right;   TRIGGER FINGER RELEASE Left 01/26/2018   Procedure: LEFT TRIGGER THUMB RELEASE;  Surgeon: Betha Loa, MD;  Location: [facility];  Service: Orthopedics;  Laterality: Left;   ULTRASOUND GUIDANCE FOR VASCULAR ACCESS Right 03/30/2019   Procedure: Ultrasound-guided cannulation right internal jugular vein;  Surgeon: Maeola Harman, MD;  Location: [facility] OR;  Service: Vascular;  Laterality: Right;    Home Medications:  Allergies as of 10/13/2023       Reactions   Aripiprazole Other (See Comments)   BECOMES  VIOLENT   Seroquel [quetiapine Fumarate] Other (See Comments)   BECOMES VIOLENT   Chlorpromazine Other (See Comments)   SEVERE ANXIETY   Gabapentin Other (See Comments)   NIGHTMARES   Prednisone Hives    Quetiapine         Medication List        Accurate as of October 13, 2023 10:05 AM. If you have any questions, ask your nurse or doctor.          ALPRAZolam 1 MG tablet Commonly known as: XANAX Take 1 tablet (1 mg total) by mouth 2 (two) times daily as needed. for anxiety   amphetamine-dextroamphetamine 30 MG tablet Commonly known as: ADDERALL Take 1 tablet by mouth 2 (two) times daily.   amphetamine-dextroamphetamine 30 MG tablet Commonly known as: ADDERALL Take 1 tablet by mouth 2 (two) times daily.   amphetamine-dextroamphetamine 30 MG tablet Commonly known as: Adderall Take 1 tablet by mouth 2 (two) times daily.   aspirin EC 81 MG tablet Take 81 mg by mouth daily.   atorvastatin 40 MG tablet Commonly known as: LIPITOR Take 1 tablet (40 mg total) by mouth daily.   cetirizine 10 MG tablet Commonly known as: ZYRTEC Take 1 tablet (10 mg total) by mouth daily.   doxycycline 100 MG tablet Commonly known as: VIBRA-TABS Take 1 tablet (100 mg total) by mouth 2 (two) times daily for 7 days.   estradiol 0.1 MG/GM vaginal cream Commonly known as: ESTRACE VAGINAL Apply 0.5mg  (pea-sized amount)  just inside the vaginal introitus with a finger-tip on Monday, Wednesday and Friday nights.   famotidine 20 MG tablet Commonly known as: PEPCID Take 20 mg by mouth daily.   FLUoxetine 40 MG capsule Commonly known as: PROZAC Take 1 capsule (40 mg total) by mouth 2 (two) times daily.   GARLIC PO Take 1 tablet by mouth daily.   Gemtesa 75 MG Tabs Generic drug: Vibegron Take 1 tablet (75 mg total) by mouth daily.   HYDROcodone-acetaminophen 10-325 MG tablet Commonly known as: NORCO Take 1 tablet by mouth every 6 (six) hours as needed. To last 30 days from fill date   HYDROcodone-acetaminophen 10-325 MG tablet Commonly known as: NORCO Take 1 tablet by mouth every 6 (six) hours as needed. To last 30 days from fill date Start taking on: October 23, 2023    HYDROcodone-acetaminophen 10-325 MG tablet Commonly known as: NORCO Take 1 tablet by mouth every 6 (six) hours as needed. To last 30 days from fill date Start taking on: November 22, 2023   levothyroxine 125 MCG tablet Commonly known as: SYNTHROID Take 1 tablet (125 mcg total) by mouth daily before breakfast.   linaclotide 145 MCG Caps capsule Commonly known as: Linzess Take 1 capsule (145  mcg total) by mouth daily.   lisinopril 20 MG tablet Commonly known as: ZESTRIL Take 1 tablet (20 mg total) by mouth daily.   methocarbamol 750 MG tablet Commonly known as: ROBAXIN Take 1 tablet (750 mg total) by mouth 2 (two) times daily as needed for muscle spasms.   multivitamin capsule Take 1 capsule by mouth daily.   nitrofurantoin 100 MG capsule Commonly known as: Macrodantin Take 1 capsule (100 mg total) by mouth daily.   omega-3 acid ethyl esters 1 g capsule Commonly known as: LOVAZA Take by mouth.   Ozempic (2 MG/DOSE) 8 MG/3ML Sopn Generic drug: Semaglutide (2 MG/DOSE) Inject 2 mg as directed once a week.   pantoprazole 40 MG tablet Commonly known as: PROTONIX Take 1 tablet (40 mg total) by mouth daily.   polyethylene glycol powder 17 GM/SCOOP powder Commonly known as: GLYCOLAX/MIRALAX Take 17 g by mouth daily.   potassium chloride 10 MEQ tablet Commonly known as: KLOR-CON M Take 1 tablet (10 mEq total) by mouth 3 (three) times daily.   pregabalin 100 MG capsule Commonly known as: LYRICA Take 1 capsule (100 mg total) by mouth 4 (four) times daily.   rOPINIRole 2 MG tablet Commonly known as: REQUIP Take 1 tablet (2 mg total) by mouth 2 (two) times daily.   traZODone 150 MG tablet Commonly known as: DESYREL Take 1 tablet (150 mg total) by mouth at bedtime.   valACYclovir 1000 MG tablet Commonly known as: VALTREX Take 2 tablets by mouth at first onset of fever blisters, then take 2 tablets by mouth 12 hours later.   warfarin 5 MG tablet Commonly known as:  COUMADIN Take as directed by the anticoagulation clinic. If you are unsure how to take this medication, talk to your nurse or doctor. Original instructions: TAKE 1 TABLET BY MOUTH DAILY        Allergies:  Allergies  Allergen Reactions   Aripiprazole Other (See Comments)    BECOMES  VIOLENT    Seroquel [Quetiapine Fumarate] Other (See Comments)    BECOMES VIOLENT   Chlorpromazine Other (See Comments)    SEVERE ANXIETY    Gabapentin Other (See Comments)    NIGHTMARES    Prednisone Hives   Quetiapine     Family History: Family History  Problem Relation Age of Onset   Heart disease Mother    Hyperlipidemia Mother    Hypertension Mother    Bipolar disorder Mother    Stroke Mother    Depression Mother    Sleep apnea Mother    Obesity Mother    Diabetes Father    Heart disease Father    Hyperlipidemia Father    Hypertension Father    Sleep apnea Father    Obesity Father    Drug abuse Daughter    ADD / ADHD Daughter    Drug abuse Daughter    Anxiety disorder Daughter    Bipolar disorder Daughter    Hypertension Sister    Hypertension Brother    Hyperlipidemia Brother    Heart disease Brother    Bipolar disorder Maternal Aunt    Suicidality Maternal Aunt     Social History:  reports that she has never smoked. She has never used smokeless tobacco. She reports that she does not drink alcohol and does not use drugs.  ROS:  Physical Exam: BP 121/71   Pulse 61   LMP  (LMP Unknown)   Constitutional:  Alert and oriented, No acute distress.  Laboratory Data: Lab Results  Component Value Date   WBC 6.1 09/11/2023   HGB 12.8 09/11/2023   HCT 40.0 09/11/2023   MCV 97.1 09/11/2023   PLT 137 (L) 09/11/2023    Lab Results  Component Value Date   CREATININE 1.35 (H) 09/11/2023    No results found for: "PSA"  No results found for: "TESTOSTERONE"  Lab Results  Component Value Date   HGBA1C 5.7  (H) 05/09/2023    Urinalysis    Component Value Date/Time   COLORURINE YELLOW (A) 01/30/2023 1328   APPEARANCEUR Clear 07/14/2023 1012   LABSPEC 1.010 01/30/2023 1328   PHURINE 7.0 01/30/2023 1328   GLUCOSEU Negative 07/14/2023 1012   HGBUR SMALL (A) 01/30/2023 1328   BILIRUBINUR Negative 10/06/2023 1418   BILIRUBINUR Negative 07/14/2023 1012   KETONESUR NEGATIVE 01/30/2023 1328   PROTEINUR Negative 10/06/2023 1418   PROTEINUR Negative 07/14/2023 1012   PROTEINUR NEGATIVE 01/30/2023 1328   UROBILINOGEN 0.2 10/06/2023 1418   NITRITE Negative 10/06/2023 1418   NITRITE Negative 07/14/2023 1012   NITRITE NEGATIVE 01/30/2023 1328   LEUKOCYTESUR Negative 10/06/2023 1418   LEUKOCYTESUR Negative 07/14/2023 1012   LEUKOCYTESUR NEGATIVE 01/30/2023 1328    Pertinent Imaging: Urine sent for culture  Assessment & Plan: Macrodantin 90 x 3 sent to pharmacy and see in 1 year.  In 1 year she might want to try to stop it and we discussed this.  Proceed accordingly  1. Microscopic hematuria  - Urinalysis, Complete   No follow-ups on file.  Martina Sinner, MD  [facility] [address] [place], [place] [address] [phone]

## 2023-10-13 NOTE — Progress Notes (Signed)
PROVIDER NOTE: Interpretation of information contained herein should be left to medically-trained personnel. Specific patient instructions are provided elsewhere under "Patient Instructions" section of medical record. This document was created in part using STT-dictation technology, any transcriptional errors that may result from this process are unintentional.  Patient: Jane [name] Type: Established DOB: [birth year] MRN: [id number] PCP: Erasmo Downer, MD  Service: Procedure DOS: 10/13/2023 Setting: Ambulatory Location: Ambulatory outpatient facility Delivery: Face-to-face Provider: Edward Jolly, MD Specialty: Interventional Pain Management Specialty designation: 09 Location: Outpatient facility Ref. Prov.: Bacigalupo, Marzella Schlein, MD       Interventional Therapy   Interventional Treatment:           Type: Qutenza Neurolysis #3  Laterality:  Bilateral Area treated: Feet Imaging Guidance: None Anesthesia/analgesia/anxiolysis/sedation: None required Medication (Right): Qutenza (capsaicin 8%) topical system Medication (Left): Qutenza (capsaicin 8%) topical system Date: 10/13/2023 Performed by: Edward Jolly, MD Rationale (medical necessity): procedure needed and proper for the treatment of Jane [name]'s medical symptoms and needs. 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 1. Diabetic peripheral neuropathy (HCC)   2. Bilateral primary osteoarthritis of knee   3. Bilateral chronic knee pain     NAS-11 Pain score:   Pre-procedure: 2 /10   Post-procedure: 2 /10     Position / Prep / Materials:  Position: Supine  Materials: Qutenza Kit  Pre-op H&P Assessment:  Jane [name] is a 57 y.o. (year old), female patient, seen today for interventional treatment. She  has a past surgical history that includes Cholecystectomy; Trigger finger release (Right, 12/01/2017); Abdominal hysterectomy  (06/2016); Lumbar fusion (11/21/2000); Lumbar spine surgery; Trigger finger release (Left, 01/26/2018); LOWER EXTREMITY VENOGRAPHY (N/A, 08/17/2018); Femoral-femoral Bypass Graft (Left, 11/24/2018); AV fistula placement (Left, 11/24/2018); LOWER EXTREMITY VENOGRAPHY (Bilateral, 03/09/2019); Patch angioplasty (Left, 03/30/2019); Ultrasound guidance for vascular access (Right, 03/30/2019); Femoral artery debridement (03/30/2019); Insertion of iliac stent (03/30/2019); Groin debridement (Left, 04/27/2019); Application if wound vac (Left, 04/27/2019); LOWER EXTREMITY VENOGRAPHY (Left, 08/16/2019); PERIPHERAL VASCULAR INTERVENTION (Left, 08/16/2019); Back surgery; Colonoscopy with propofol (N/A, 11/22/2019); Knee arthroscopy with meniscal repair (Left, 11/14/2020); LOWER EXTREMITY VENOGRAPHY (Left, 03/25/2022); PERIPHERAL VASCULAR THROMBECTOMY (Left, 03/25/2022); PERIPHERAL VASCULAR INTERVENTION (Left, 03/25/2022); and Colonoscopy with propofol (N/A, 12/09/2022). Jane [name] has a current medication list which includes the following prescription(s): alprazolam, amphetamine-dextroamphetamine, amphetamine-dextroamphetamine, amphetamine-dextroamphetamine, aspirin ec, atorvastatin, cetirizine, doxycycline, estradiol, famotidine, fluoxetine, garlic, hydrocodone-acetaminophen, [START ON 10/23/2023] hydrocodone-acetaminophen, [START ON 11/22/2023] hydrocodone-acetaminophen, levothyroxine, linaclotide, lisinopril, methocarbamol, multivitamin, nitrofurantoin, omega-3 acid ethyl esters, pantoprazole, polyethylene glycol powder, potassium chloride, pregabalin, ropinirole, semaglutide (2 mg/dose), trazodone, valacyclovir, gemtesa, and warfarin, and the following Facility-Administered Medications: capsaicin topical system. Her primarily concern today is the feet pain (Bilateral )  Initial Vital Signs:  Pulse/HCG Rate: 64  Temp: 98.2 F (36.8 C) Resp: 18 BP: 126/73 SpO2: (!) 10 %  BMI: Estimated body mass index is 32.71 kg/m as calculated  from the following:   Height as of this encounter: 5\' 10"  (1.778 m).   Weight as of this encounter: 228 lb (103.4 kg).  Risk Assessment: Allergies: Reviewed. She is allergic to aripiprazole, seroquel [quetiapine fumarate], chlorpromazine, gabapentin, prednisone, and quetiapine.  Allergy Precautions: None required Coagulopathies: Reviewed. None identified.  Blood-thinner therapy: None at this time Active Infection(s): Reviewed. None identified. Jane [name] is afebrile  Site Confirmation: Jane [name] was asked to confirm the procedure and laterality before marking the site Procedure checklist: Completed Consent: Before the procedure and under the influence of no sedative(s), amnesic(s), or anxiolytics, the  patient was informed of the treatment options, risks and possible complications. To fulfill our ethical and legal obligations, as recommended by the American Medical Association's Code of Ethics, I have informed the patient of my clinical impression; the nature and purpose of the treatment or procedure; the risks, benefits, and possible complications of the intervention; the alternatives, including doing nothing; the risk(s) and benefit(s) of the alternative treatment(s) or procedure(s); and the risk(s) and benefit(s) of doing nothing. The patient was provided information about the general risks and possible complications associated with the procedure. These may include, but are not limited to: failure to achieve desired goals, infection, bleeding, organ or nerve damage, allergic reactions, paralysis, and death. In addition, the patient was informed of those risks and complications associated to the procedure, such as failure to decrease pain; infection; bleeding; organ or nerve damage with subsequent damage to sensory, motor, and/or autonomic systems, resulting in permanent pain, numbness, and/or weakness of one or several areas of the body; allergic reactions; (i.e.: anaphylactic reaction);  and/or death. Furthermore, the patient was informed of those risks and complications associated with the medications. These include, but are not limited to: allergic reactions (i.e.: anaphylactic or anaphylactoid reaction(s)); adrenal axis suppression; blood sugar elevation that in diabetics may result in ketoacidosis or comma; water retention that in patients with history of congestive heart failure may result in shortness of breath, pulmonary edema, and decompensation with resultant heart failure; weight gain; swelling or edema; medication-induced neural toxicity; particulate matter embolism and blood vessel occlusion with resultant organ, and/or nervous system infarction; and/or aseptic necrosis of one or more joints. Finally, the patient was informed that Medicine is not an exact science; therefore, there is also the possibility of unforeseen or unpredictable risks and/or possible complications that may result in a catastrophic outcome. The patient indicated having understood very clearly. We have given the patient no guarantees and we have made no promises. Enough time was given to the patient to ask questions, all of which were answered to the patient's satisfaction. Ms. Hauenstein has indicated that she wanted to continue with the procedure. Attestation: I, the ordering provider, attest that I have discussed with the patient the benefits, risks, side-effects, alternatives, likelihood of achieving goals, and potential problems during recovery for the procedure that I have provided informed consent. Date  Time: 10/13/2023 12:53 PM  Pre-Procedure Preparation:  Monitoring: As per clinic protocol. Respiration, ETCO2, SpO2, BP, heart rate and rhythm monitor placed and checked for adequate function Safety Precautions: Patient was assessed for positional comfort and pressure points before starting the procedure. Time-out: I initiated and conducted the "Time-out" before starting the procedure, as per protocol.  The patient was asked to participate by confirming the accuracy of the "Time Out" information. Verification of the correct person, site, and procedure were performed and confirmed by me, the nursing staff, and the patient. "Time-out" conducted as per Joint Commission's Universal Protocol (UP.01.01.01). Time: 1322 Start Time: 1331 hrs.  Description/Narrative of Procedure:          Region: Distal lower extremity Target Area: Sensory peripheral nerves affected by diabetic peripheral neuropathy Site: Feet Approach: Percutaneous  No./Series: Not applicable  Type: Percutaneous  Purpose: Therapeutic  Region: Distal lower extremities  Start Time: 1331 hrs.  Description of the Procedure: Protocol guidelines were followed. The patient was assisted into a comfortable position.  Informed consent was obtained in the patient monitored in the usual manner.  All questions were answered prior to the procedure.  They Qutenza patches were  applied to the affected area and then covered with the wrap.  The Patient was kept under observation until the treatment was completed.  The patches were removed and the treated area was inspected.  Vitals:   10/13/23 1257  BP: 126/73  Pulse: 64  Resp: 18  Temp: 98.2 F (36.8 C)  TempSrc: Temporal  SpO2: (!) 10%  Weight: 228 lb (103.4 kg)  Height: 5\' 10"  (1.778 m)      End Time:   hrs.  Imaging Guidance:          Type of Imaging Technique: None used Indication(s): N/A Exposure Time: No patient exposure Contrast: None used. Fluoroscopic Guidance: N/A Ultrasound Guidance: N/A Interpretation: N/A  Post-operative Assessment:  Post-procedure Vital Signs:  Pulse/HCG Rate: 64  Temp: 98.2 F (36.8 C) Resp: 18 BP: 126/73 SpO2: (!) 10 %  EBL: None  Complications: No immediate post-treatment complications observed by team, or reported by patient.  Note: The patient tolerated the entire procedure well. A repeat set of vitals were taken after the procedure  and the patient was kept under observation following institutional policy, for this type of procedure. Post-procedural neurological assessment was performed, showing return to baseline, prior to discharge. The patient was provided with post-procedure discharge instructions, including a section on how to identify potential problems. Should any problems arise concerning this procedure, the patient was given instructions to immediately contact us, at any time, without hesitation. In any case, we plan to contact the patient by telephone for a follow-up status report regarding this interventional procedure.  Comments:  No additional relevant information.  Plan of Care (POC)  Orders:   Persistent knee pain related to knee OA, benefit with Monovisc #1 bilateral knee but still having some left knee pain, repeat #2 next month Orders Placed This Encounter  Procedures   KNEE INJECTION    Indications: Knee arthralgia (pain) due to osteoarthritis (OA) Imaging: None (CPT-20610) Nursing: Please request pharmacy to have Monovisc (Hyaluronan) available in office.    Standing Status:   Future    Standing Expiration Date:   01/11/2024    Scheduling Instructions:     Procedure: Knee injection Monovisc (Hyaluronan)     Treatment No.:  2          Level: Intra-articular     Laterality: Bilateral Knee     Sedation: Patient's choice.     Timeframe: in two (2) weeks     Total Hyaluronic acid (AKA hyaluronan or hyaluronate) per injection: 88 mg (22.0 mg/mL x 4 mL). Series of 1 injection.    Order Specific Question:   Where will this procedure be performed?    Answer:   [facility]   Chronic Opioid Analgesic:  Hydrocodone 10 mg 4 times daily as needed, quantity 120/month; MME equals 40   Medications ordered for procedure: Meds ordered this encounter  Medications   capsaicin topical system 8 % patch 4 patch   Medications administered: Burnadette Pop had no medications administered during this visit.   See the medical record for exact dosing, route, and time of administration.  Follow-up plan:   Return in about 3 weeks (around 11/03/2023) for B/L knee monovisc.       Recent Visits Date Type Provider Dept  09/29/23 Procedure visit Edward Jolly, MD [facility]  09/11/23 Office Visit Edward Jolly, MD [facility]  Showing recent visits within past 90 days and meeting all other requirements Today's Visits Date Type Provider Dept  10/13/23 Procedure visit  Edward Jolly, MD [facility]  Showing today's visits and meeting all other requirements Future Appointments Date Type Provider Dept  12/09/23 Appointment Edward Jolly, MD [facility]  Showing future appointments within next 90 days and meeting all other requirements  Disposition: Discharge home  Discharge (Date  Time): 10/13/2023;   hrs.   Primary Care Physician: Erasmo Downer, MD Location: [facility] Note by: Edward Jolly, MD (TTS technology used. I apologize for any typographical errors that were not detected and corrected.) Date: 10/13/2023; Time: 2:05 PM  Disclaimer:  Medicine is not an Visual merchandiser. The only guarantee in medicine is that nothing is guaranteed. It is important to note that the decision to proceed with this intervention was based on the information collected from the patient. The Data and conclusions were drawn from the patient's questionnaire, the interview, and the physical examination. Because the information was provided in large part by the patient, it cannot be guaranteed that it has not been purposely or unconsciously manipulated. Every effort has been made to obtain as much relevant data as possible for this evaluation. It is important to note that the conclusions that lead to this procedure are derived in large part from the available data. Always take into account that the treatment will also be dependent on availability of  resources and existing treatment guidelines, considered by other Pain Management Practitioners as being common knowledge and practice, at the time of the intervention. For Medico-Legal purposes, it is also important to point out that variation in procedural techniques and pharmacological choices are the acceptable norm. The indications, contraindications, technique, and results of the above procedure should only be interpreted and judged by a Board-Certified Interventional Pain Specialist with extensive familiarity and expertise in the same exact procedure and technique.

## 2023-10-14 ENCOUNTER — Telehealth: Payer: Self-pay | Admitting: *Deleted

## 2023-10-14 NOTE — Telephone Encounter (Signed)
No problems post Qutenza applicatioin

## 2023-10-15 ENCOUNTER — Ambulatory Visit: Payer: MEDICAID

## 2023-10-15 DIAGNOSIS — Z7901 Long term (current) use of anticoagulants: Secondary | ICD-10-CM | POA: Diagnosis not present

## 2023-10-15 LAB — POCT INR
INR: 2.1 (ref 2.0–3.0)
PT: 25.7

## 2023-10-15 NOTE — Patient Instructions (Signed)
Description   7.5mg  Wednesday and Saturday and 5 mg daily. F/U 4 weeks

## 2023-10-30 ENCOUNTER — Other Ambulatory Visit: Payer: Self-pay | Admitting: Family Medicine

## 2023-10-30 DIAGNOSIS — K5909 Other constipation: Secondary | ICD-10-CM

## 2023-11-10 ENCOUNTER — Encounter: Payer: MEDICAID | Admitting: Family Medicine

## 2023-11-12 ENCOUNTER — Ambulatory Visit: Payer: MEDICAID | Admitting: Student in an Organized Health Care Education/Training Program

## 2023-11-17 ENCOUNTER — Encounter: Payer: Self-pay | Admitting: Student in an Organized Health Care Education/Training Program

## 2023-11-17 ENCOUNTER — Ambulatory Visit
Payer: MEDICAID | Attending: Student in an Organized Health Care Education/Training Program | Admitting: Student in an Organized Health Care Education/Training Program

## 2023-11-17 VITALS — BP 152/91 | HR 60 | Temp 98.1°F | Ht 70.0 in | Wt 220.0 lb

## 2023-11-17 DIAGNOSIS — M170 Bilateral primary osteoarthritis of knee: Secondary | ICD-10-CM | POA: Diagnosis present

## 2023-11-17 MED ORDER — HYALURONAN 88 MG/4ML IX SOSY
88.0000 mg | PREFILLED_SYRINGE | Freq: Once | INTRA_ARTICULAR | Status: AC
  Administered 2023-11-17: 88 mg via INTRA_ARTICULAR

## 2023-11-17 MED ORDER — ROPIVACAINE HCL 2 MG/ML IJ SOLN
INTRAMUSCULAR | Status: AC
  Filled 2023-11-17: qty 20

## 2023-11-17 MED ORDER — LIDOCAINE HCL 2 % IJ SOLN
20.0000 mL | Freq: Once | INTRAMUSCULAR | Status: AC
  Administered 2023-11-17: 400 mg

## 2023-11-17 MED ORDER — LIDOCAINE HCL 2 % IJ SOLN
INTRAMUSCULAR | Status: AC
  Filled 2023-11-17: qty 20

## 2023-11-17 NOTE — Patient Instructions (Signed)

## 2023-11-17 NOTE — Progress Notes (Signed)
 PROVIDER NOTE: Interpretation of information contained herein should be left to medically-trained personnel. Specific patient instructions are provided elsewhere under Patient Instructions section of medical record. This document was created in part using STT-dictation technology, any transcriptional errors that may result from this process are unintentional.  Patient: Jane [name] Type: Established DOB: November 27, 1965 MRN: [id number] PCP: Myrla Jon HERO, MD  Service: Procedure DOS: 11/17/2023 Setting: Ambulatory Location: Ambulatory outpatient facility Delivery: Face-to-face Provider: Wallie Sherry, MD Specialty: Interventional Pain Management Specialty designation: 09 Location: Outpatient facility Ref. Prov.: Bacigalupo, Jon HERO, MD       Interventional Therapy   Type:  Monovisc Intra-articular Knee Injection #2 Laterality: Bilateral (-50) Level/approach: Medial Imaging guidance: None required ([id number]) Anesthesia: Local anesthesia (1-2% Lidocaine ) DOS: 11/17/2023  Performed by: Wallie Sherry, MD  Purpose: Diagnostic/Therapeutic Indications: Knee arthralgia associated to osteoarthritis of the knee 1. Bilateral primary osteoarthritis of knee    NAS-11 score:   Pre-procedure: 9 /10   Post-procedure: 0-No pain/10     Pre-Procedure Preparation  Monitoring: As per clinic protocol.  Risk Assessment: Vitals:  AFP:Zdupfjuzi body mass index is 31.57 kg/m as calculated from the following:   Height as of this encounter: 5' 10 (1.778 m).   Weight as of this encounter: 220 lb (99.8 kg)., Rate:60 , BP:(!) 150/79, Resp: , Temp:98.1 F (36.7 C), SpO2:100 %  Allergies: She is allergic to aripiprazole, seroquel  [quetiapine  fumarate], chlorpromazine , gabapentin , prednisone , and quetiapine .  Precautions: No additional precautions required  Blood-thinner(s): None at this time  Coagulopathies: Reviewed. None identified.   Active Infection(s): Reviewed. None identified. Jane [name] is  afebrile   Location setting: Exam room Position: Sitting w/ knee bent 90 degrees Safety Precautions: Patient was assessed for positional comfort and pressure points before starting the procedure. Prepping solution: DuraPrep (Iodine  Povacrylex [0.7% available iodine ] and Isopropyl Alcohol, 74% w/w) Prep Area: Entire knee region Approach: percutaneous, just above the tibial plateau, lateral to the infrapatellar tendon. Intended target: Intra-articular knee space Materials: Tray: Block Needle(s): Regular Qty: 1/side Length: 1.5-inch Gauge: 25G (x1) + 22G (x1)  Meds ordered this encounter  Medications   Hyaluronan (MONOVISC) intra-articular injection 88 mg    Do not substitute for formulary alternative without first contacting physician for approval. Deliver to facility day before procedure.   lidocaine  (XYLOCAINE ) 2 % (with pres) injection 400 mg   Hyaluronan (MONOVISC) intra-articular injection 88 mg    Do not substitute for formulary alternative without first contacting physician for approval. Deliver to facility day before procedure.    No orders of the defined types were placed in this encounter.    Time-out: 1052 I initiated and conducted the Time-out before starting the procedure, as per protocol. The patient was asked to participate by confirming the accuracy of the Time Out information. Verification of the correct person, site, and procedure were performed and confirmed by me, the nursing staff, and the patient. Time-out conducted as per Joint Commission's Universal Protocol (UP.01.01.01). Procedure checklist: Completed  H&P (Pre-op  Assessment)  Jane [name] is a 58 y.o. (year old), female patient, seen today for interventional treatment. She  has a past surgical history that includes Cholecystectomy; Trigger finger release (Right, 12/01/2017); Abdominal hysterectomy (06/2016); Lumbar fusion (11/21/2000); Lumbar spine surgery; Trigger finger release (Left, 01/26/2018); LOWER  EXTREMITY VENOGRAPHY (N/A, 08/17/2018); Femoral-femoral Bypass Graft (Left, 11/24/2018); AV fistula placement (Left, 11/24/2018); LOWER EXTREMITY VENOGRAPHY (Bilateral, 03/09/2019); Patch angioplasty (Left, 03/30/2019); Ultrasound guidance for vascular access (Right, 03/30/2019); Femoral artery debridement (03/30/2019); Insertion of iliac stent (03/30/2019); Groin debridement (  Left, 04/27/2019); Application if wound vac (Left, 04/27/2019); LOWER EXTREMITY VENOGRAPHY (Left, 08/16/2019); PERIPHERAL VASCULAR INTERVENTION (Left, 08/16/2019); Back surgery; Colonoscopy with propofol  (N/A, 11/22/2019); Knee arthroscopy with meniscal repair (Left, 11/14/2020); LOWER EXTREMITY VENOGRAPHY (Left, 03/25/2022); PERIPHERAL VASCULAR THROMBECTOMY (Left, 03/25/2022); PERIPHERAL VASCULAR INTERVENTION (Left, 03/25/2022); and Colonoscopy with propofol  (N/A, 12/09/2022). Jane [name] has a current medication list which includes the following prescription(s): alprazolam , amphetamine -dextroamphetamine , amphetamine -dextroamphetamine , amphetamine -dextroamphetamine , aspirin  ec, atorvastatin , cetirizine , estradiol , famotidine , fluoxetine , garlic, hydrocodone -acetaminophen , [START ON 11/22/2023] hydrocodone -acetaminophen , levothyroxine , linaclotide , lisinopril , methocarbamol , multivitamin, nitrofurantoin , omega-3 acid ethyl esters, pantoprazole , polyethylene glycol powder, potassium chloride , pregabalin , rizatriptan, ropinirole , semaglutide  (2 mg/dose), trazodone , valacyclovir , gemtesa , and warfarin. Her primarily concern today is the Knee Pain (Bilateral with left worse)  She is allergic to aripiprazole, seroquel  [quetiapine  fumarate], chlorpromazine , gabapentin , prednisone , and quetiapine .   Last encounter: My last encounter with her was on 10/13/2023. 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 Pain Assessment: Severity of Chronic pain is reported as a 9 /10. Location: Knee Right, Left/denies. Onset: More than a month ago. Quality: Constant. Timing: Constant. Modifying factor(s): denies. Vitals:  height is 5' 10 (1.778 m) and weight is 220 lb (99.8 kg). Her temperature is 98.1 F (36.7 C). Her blood pressure is 152/91 (abnormal) and her pulse is 60. Her oxygen saturation is 100%.   Reason for encounter: interventional pain management therapy due pain of at least four (4) weeks in duration, with failure to respond and/or inability to tolerate more conservative care.  Site Confirmation: Jane [name] was asked to confirm the procedure and laterality before marking the site.  Consent: Before the procedure and under the influence of no sedative(s), amnesic(s), or anxiolytics, the patient was informed of the treatment options, risks and possible complications. To fulfill our ethical and legal obligations, as recommended by the American Medical Association's Code of Ethics, I have informed the patient of my clinical impression; the nature and purpose of the treatment or procedure; the risks, benefits, and possible complications of the intervention; the alternatives, including doing nothing; the risk(s) and benefit(s) of the alternative treatment(s) or procedure(s); and the risk(s) and  benefit(s) of doing nothing. The patient was provided information about the general risks and possible complications associated with the procedure. These may include, but are not limited to: failure to achieve desired goals, infection, bleeding, organ or nerve damage, allergic reactions, paralysis, and death. In addition, the patient was informed of those risks and complications associated to Spine-related procedures, such as failure to decrease pain; infection (i.e.: Meningitis, epidural or intraspinal abscess); bleeding (i.e.: epidural hematoma, subarachnoid hemorrhage, or any other type of intraspinal or peri-dural bleeding); organ or nerve damage (i.e.: Any type of peripheral nerve, nerve root, or spinal cord injury) with subsequent damage to sensory, motor, and/or autonomic systems, resulting in permanent pain, numbness, and/or weakness of one or several areas of the body; allergic reactions; (i.e.: anaphylactic reaction); and/or death. Furthermore, the patient was informed of those  risks and complications associated with the medications. These include, but are not limited to: allergic reactions (i.e.: anaphylactic or anaphylactoid reaction(s)); adrenal axis suppression; blood sugar elevation that in diabetics may result in ketoacidosis or comma; water retention that in patients with history of congestive heart failure may result in shortness of breath, pulmonary edema, and decompensation with resultant heart failure; weight gain; swelling or edema; medication-induced neural toxicity; particulate matter embolism and blood vessel occlusion with resultant organ, and/or nervous system infarction; and/or aseptic necrosis of one or more joints. Finally, the patient was informed that Medicine is not an exact science; therefore, there is also the possibility of unforeseen or unpredictable risks and/or possible complications that may result in a catastrophic outcome. The patient indicated having understood very  clearly. We have given the patient no guarantees and we have made no promises. Enough time was given to the patient to ask questions, all of which were answered to the patient's satisfaction. Jane [name] has indicated that she wanted to continue with the procedure. Attestation: I, the ordering provider, attest that I have discussed with the patient the benefits, risks, side-effects, alternatives, likelihood of achieving goals, and potential problems during recovery for the procedure that I have provided informed consent.  Date  Time: 11/17/2023  9:58 AM  Description of procedure  Start Time: 1052 hrs  Local Anesthesia: Once the patient was positioned, prepped, and time-out was completed. The target area was identified located. The skin was marked with an approved surgical skin marker. Once marked, the skin (epidermis, dermis, and hypodermis), and deeper tissues (fat, connective tissue and muscle) were infiltrated with a small amount of a short-acting local anesthetic, loaded on a 10cc syringe with a 25G, 1.5-in  Needle. An appropriate amount of time was allowed for local anesthetics to take effect before proceeding to the next step. Local Anesthetic: Lidocaine  1-2% The unused portion of the local anesthetic was discarded in the proper designated containers. Safety Precautions: Aspiration looking for blood return was conducted prior to all injections. At no point did I inject any substances, as a needle was being advanced. Before injecting, the patient was told to immediately notify me if she was experiencing any new onset of ringing in the ears, or metallic taste in the mouth. No attempts were made at seeking any paresthesias. Safe injection practices and needle disposal techniques used. Medications properly checked for expiration dates. SDV (single dose vial) medications used. After the completion of the procedure, all disposable equipment used was discarded in the proper designated medical waste  containers.  Technical description: Protocol guidelines were followed. After positioning, the target area was identified and prepped in the usual manner. Skin & deeper tissues infiltrated with local anesthetic. Appropriate amount of time allowed to pass for local anesthetics to take effect. Proper needle placement secured. Once satisfactory needle placement was confirmed, I proceeded to inject the desired solution in slow, incremental fashion, intermittently assessing for discomfort or any signs of abnormal or undesired spread of substance. Once completed, the needle was removed and disposed of, as per hospital protocols. The area was cleaned, making sure to leave some of the prepping solution back to take advantage of its long term bactericidal properties.  Aspiration:  Negative        Vitals:   11/17/23 1004 11/17/23 1055  BP: (!) 150/79 (!) 152/91  Pulse: 60   Temp: 98.1 F (36.7 C)   SpO2: 100%   Weight: 220 lb (99.8 kg)   Height: 5' 10 (1.778 m)  End Time: 1054 hrs  Imaging guidance  Imaging-assisted Technique: None required. Indication(s): N/A Exposure Time: N/A Contrast: None Fluoroscopic Guidance: N/A Ultrasound Guidance: N/A Interpretation: N/A  Post-op assessment  Post-procedure Vital Signs:  Pulse/HCG Rate: 60  Temp: 98.1 F (36.7 C) Resp:   BP: (!) 152/91 SpO2: 100 %  EBL: None  Complications: No immediate post-treatment complications observed by team, or reported by patient.  Note: The patient tolerated the entire procedure well. A repeat set of vitals were taken after the procedure and the patient was kept under observation following institutional policy, for this type of procedure. Post-procedural neurological assessment was performed, showing return to baseline, prior to discharge. The patient was provided with post-procedure discharge instructions, including a section on how to identify potential problems. Should any problems arise concerning this  procedure, the patient was given instructions to immediately contact us , at any time, without hesitation. In any case, we plan to contact the patient by telephone for a follow-up status report regarding this interventional procedure.  Comments:  No additional relevant information.  Plan of care  Chronic Opioid Analgesic:  Hydrocodone  10 mg 4 times daily as needed, quantity 120/month; MME equals 40   Medications administered: We administered Hyaluronan, lidocaine , and Hyaluronan.  Follow-up plan:   No follow-ups on file.     Recent Visits Date Type Provider Dept  10/13/23 Procedure visit Marcelino Nurse, MD [facility]  09/29/23 Procedure visit Marcelino Nurse, MD [facility]  09/11/23 Office Visit Marcelino Nurse, MD [facility]  Showing recent visits within past 90 days and meeting all other requirements Today's Visits Date Type Provider Dept  11/17/23 Procedure visit Marcelino Nurse, MD [facility]  Showing today's visits and meeting all other requirements Future Appointments Date Type Provider Dept  12/09/23 Appointment Marcelino Nurse, MD [facility]  Showing future appointments within next 90 days and meeting all other requirements   Disposition: Discharge home  Discharge (Date  Time): 11/17/2023; 1056 hrs.   Primary Care Physician: Myrla Jon HERO, MD Location: [facility] Note by: Nurse Marcelino, MD Date: 11/17/2023; Time: 11:10 AM  DISCLAIMER: Medicine is not an exact science. It has no guarantees or warranties. The decision to proceed with this intervention was based on the information collected from the patient. Conclusions were drawn from the patient's questionnaire, interview, and examination. Because information was provided in large part by the patient, it cannot be guaranteed that it has not been purposely or unconsciously manipulated or altered. Every effort has been made to obtain as much  accurate, relevant, available data as possible. Always take into account that the treatment will also be dependent on availability of resources and existing treatment guidelines, considered by other Pain Management Specialists as being common knowledge and practice, at the time of the intervention. It is also important to point out that variation in procedural techniques and pharmacological choices are the acceptable norm. For Medico-Legal review purposes, the indications, contraindications, technique, and results of the these procedures should only be evaluated, judged and interpreted by a Board-Certified Interventional Pain Specialist with extensive familiarity and expertise in the same exact procedure and technique.

## 2023-11-17 NOTE — Progress Notes (Signed)
 Safety precautions to be maintained throughout the outpatient stay will include: orient to surroundings, keep bed in low position, maintain call bell within reach at all times, provide assistance with transfer out of bed and ambulation.

## 2023-11-18 ENCOUNTER — Ambulatory Visit: Payer: MEDICAID | Admitting: Dermatology

## 2023-11-18 ENCOUNTER — Telehealth: Payer: Self-pay | Admitting: *Deleted

## 2023-11-18 DIAGNOSIS — L821 Other seborrheic keratosis: Secondary | ICD-10-CM

## 2023-11-18 DIAGNOSIS — Z1283 Encounter for screening for malignant neoplasm of skin: Secondary | ICD-10-CM | POA: Diagnosis not present

## 2023-11-18 DIAGNOSIS — D229 Melanocytic nevi, unspecified: Secondary | ICD-10-CM

## 2023-11-18 DIAGNOSIS — L578 Other skin changes due to chronic exposure to nonionizing radiation: Secondary | ICD-10-CM | POA: Diagnosis not present

## 2023-11-18 DIAGNOSIS — L304 Erythema intertrigo: Secondary | ICD-10-CM

## 2023-11-18 DIAGNOSIS — D225 Melanocytic nevi of trunk: Secondary | ICD-10-CM

## 2023-11-18 DIAGNOSIS — L814 Other melanin hyperpigmentation: Secondary | ICD-10-CM | POA: Diagnosis not present

## 2023-11-18 DIAGNOSIS — W908XXA Exposure to other nonionizing radiation, initial encounter: Secondary | ICD-10-CM | POA: Diagnosis not present

## 2023-11-18 DIAGNOSIS — I872 Venous insufficiency (chronic) (peripheral): Secondary | ICD-10-CM

## 2023-11-18 DIAGNOSIS — D1801 Hemangioma of skin and subcutaneous tissue: Secondary | ICD-10-CM

## 2023-11-18 MED ORDER — MUPIROCIN 2 % EX OINT
TOPICAL_OINTMENT | CUTANEOUS | 3 refills | Status: DC
Start: 2023-11-18 — End: 2024-03-30

## 2023-11-18 MED ORDER — PIMECROLIMUS 1 % EX CREA: TOPICAL_CREAM | CUTANEOUS | 3 refills | Status: DC

## 2023-11-18 MED ORDER — KETOCONAZOLE 2 % EX CREA
TOPICAL_CREAM | CUTANEOUS | 3 refills | Status: DC
Start: 2023-11-18 — End: 2024-03-30

## 2023-11-18 NOTE — Patient Instructions (Addendum)
 Intertrigo is a chronic recurrent rash that occurs in skin fold areas that may be associated with friction; heat; moisture; yeast; fungus; and bacteria.  It is exacerbated by increased movement / activity; sweating; and higher atmospheric temperature.  Use of an absorbant powder such as Zeasorb AF powder or other OTC antifungal powder to the area daily can prevent rash recurrence. Other options to help keep the area dry include blow drying the area after bathing or using antiperspirant products such as Duradry sweat minimizing gel.   Melanoma ABCDEs  Melanoma is the most dangerous type of skin cancer, and is the leading cause of death from skin disease.  You are more likely to develop melanoma if you: Have light-colored skin, light-colored eyes, or red or blond hair Spend a lot of time in the sun Tan regularly, either outdoors or in a tanning bed Have had blistering sunburns, especially during childhood Have a close family member who has had a melanoma Have atypical moles or large birthmarks  Early detection of melanoma is key since treatment is typically straightforward and cure rates are extremely high if we catch it early.   The first sign of melanoma is often a change in a mole or a new dark spot.  The ABCDE system is a way of remembering the signs of melanoma.  A for asymmetry:  The two halves do not match. B for border:  The edges of the growth are irregular. C for color:  A mixture of colors are present instead of an even brown color. D for diameter:  Melanomas are usually (but not always) greater than 6mm - the size of a pencil eraser. E for evolution:  The spot keeps changing in size, shape, and color.  Please check your skin once per month between visits. You can use a small mirror in front and a large mirror behind you to keep an eye on the back side or your body.   If you see any new or changing lesions before your next follow-up, please call to schedule a visit.  Please  continue daily skin protection including broad spectrum sunscreen SPF 30+ to sun-exposed areas, reapplying every 2 hours as needed when you're outdoors.   Staying in the shade or wearing long sleeves, sun glasses (UVA+UVB protection) and wide brim hats (4-inch brim around the entire circumference of the hat) are also recommended for sun protection.     Due to recent changes in healthcare laws, you may see results of your pathology and/or laboratory studies on MyChart before the doctors have had a chance to review them. We understand that in some cases there may be results that are confusing or concerning to you. Please understand that not all results are received at the same time and often the doctors may need to interpret multiple results in order to provide you with the best plan of care or course of treatment. Therefore, we ask that you please give us  2 business days to thoroughly review all your results before contacting the office for clarification. Should we see a critical lab result, you will be contacted sooner.   If You Need Anything After Your Visit  If you have any questions or concerns for your doctor, please call our main line at [phone] and press option 4 to reach your doctor's medical assistant. If no one answers, please leave a voicemail as directed and we will return your call as soon as possible. Messages left after 4 pm will be answered the following business  day.   You may also send us  a message via MyChart. We typically respond to MyChart messages within 1-2 business days.  For prescription refills, please ask your pharmacy to contact our office. Our fax number is [phone].  If you have an urgent issue when the clinic is closed that cannot wait until the next business day, you can page your doctor at the number below.    Please note that while we do our best to be available for urgent issues outside of office hours, we are not available 24/7.   If you have an urgent  issue and are unable to reach us , you may choose to seek medical care at your doctor's office, retail clinic, urgent care center, or emergency room.  If you have a medical emergency, please immediately call 911 or go to the emergency department.  Pager Numbers  - Dr. Hester: [phone]  - Dr. Jackquline: [phone]  - Dr. Claudene: [phone]   In the event of inclement weather, please call our main line at [phone] for an update on the status of any delays or closures.  Dermatology Medication Tips: Please keep the boxes that topical medications come in in order to help keep track of the instructions about where and how to use these. Pharmacies typically print the medication instructions only on the boxes and not directly on the medication tubes.   If your medication is too expensive, please contact our office at [phone] option 4 or send us  a message through MyChart.   We are unable to tell what your co-pay for medications will be in advance as this is different depending on your insurance coverage. However, we may be able to find a substitute medication at lower cost or fill out paperwork to get insurance to cover a needed medication.   If a prior authorization is required to get your medication covered by your insurance company, please allow us  1-2 business days to complete this process.  Drug prices often vary depending on where the prescription is filled and some pharmacies may offer cheaper prices.  The website www.goodrx.com contains coupons for medications through different pharmacies. The prices here do not account for what the cost may be with help from insurance (it may be cheaper with your insurance), but the website can give you the price if you did not use any insurance.  - You can print the associated coupon and take it with your prescription to the pharmacy.  - You may also stop by our office during regular business hours and pick up a GoodRx coupon card.  - If  you need your prescription sent electronically to a different pharmacy, notify our office through [facility] or by phone at [phone] option 4.     Si Usted Necesita Algo Despus de Su Visita  Tambin puede enviarnos un mensaje a travs de Clinical Cytogeneticist. Por lo general respondemos a los mensajes de MyChart en el transcurso de 1 a 2 das hbiles.  Para renovar recetas, por favor pida a su farmacia que se ponga en contacto con nuestra oficina. Randi lakes de fax es [place] [phone].  Si tiene un asunto urgente cuando la clnica est cerrada y que no puede esperar hasta el siguiente da hbil, puede llamar/localizar a su doctor(a) al nmero que aparece a continuacin.   Por favor, tenga en cuenta que aunque hacemos todo lo posible para estar disponibles para asuntos urgentes fuera del horario de [place], no estamos disponibles las 24 horas del da, los [address]  de la semana.   Si tiene un problema urgente y no puede comunicarse con nosotros, puede optar por buscar atencin mdica  en el consultorio de su doctor(a), en una clnica privada, en un centro de atencin urgente o en una sala de emergencias.  Si tiene engineer, drilling, por favor llame inmediatamente al 911 o vaya a la sala de emergencias.  Nmeros de bper  - Dr. Hester: [phone]  - Dra. Jackquline: [phone]  - Dr. Claudene: [phone]   En caso de inclemencias del tiempo, por favor llame a landry capes principal al [phone] para una actualizacin sobre el [place] de cualquier retraso o cierre.  Consejos para la medicacin en dermatologa: Por favor, guarde las cajas en las que vienen los medicamentos de uso tpico para ayudarle a seguir las instrucciones sobre dnde y cmo usarlos. Las farmacias generalmente imprimen las instrucciones del medicamento slo en las cajas y no directamente en los tubos del [place].   Si su medicamento es muy caro, por favor, pngase en contacto con landry rieger llamando  al [phone] y presione la opcin 4 o envenos un mensaje a travs de Clinical Cytogeneticist.   No podemos decirle cul ser su copago por los medicamentos por adelantado ya que esto es diferente dependiendo de la cobertura de su seguro. Sin embargo, es posible que podamos encontrar un medicamento sustituto a audiological scientist un formulario para que el seguro cubra el medicamento que se considera necesario.   Si se requiere una autorizacin previa para que su compaa de seguros cubra su medicamento, por favor permtanos de 1 a 2 das hbiles para completar este proceso.  Los precios de los medicamentos varan con frecuencia dependiendo del environmental consultant de dnde se surte la receta y alguna farmacias pueden ofrecer precios ms baratos.  El sitio web www.goodrx.com tiene cupones para medicamentos de health and safety inspector. Los precios aqu no tienen en cuenta lo que podra costar con la ayuda del seguro (puede ser ms barato con su seguro), pero el sitio web puede darle el precio si no utiliz tourist information centre manager.  - Puede imprimir el cupn correspondiente y llevarlo con su receta a la farmacia.  - Tambin puede pasar por nuestra oficina durante el horario de atencin regular y education officer, museum una tarjeta de cupones de GoodRx.  - Si necesita que su receta se enve electrnicamente a una farmacia diferente, informe a nuestra oficina a travs de MyChart de [place] o por telfono llamando al [phone] y presione la opcin 4.

## 2023-11-18 NOTE — Progress Notes (Signed)
 Follow-Up Visit   Subjective  Jane [name] is a 58 y.o. female who presents for the following: Skin Cancer Screening and Full Body Skin Exam  The patient presents for Total-Body Skin Exam (TBSE) for skin cancer screening and mole check. The patient has spots, moles and lesions to be evaluated, some may be new or changing. She has breaking out with bleeding in the groin area. No history of skin cancer.     The following portions of the chart were reviewed this encounter and updated as appropriate: medications, allergies, medical history  Review of Systems:  No other skin or systemic complaints except as noted in HPI or Assessment and Plan.  Objective  Well appearing patient in no apparent distress; mood and affect are within normal limits.  A full examination was performed including scalp, head, eyes, ears, nose, lips, neck, chest, axillae, abdomen, back, buttocks, bilateral upper extremities, bilateral lower extremities, hands, feet, fingers, toes, fingernails, and toenails. All findings within normal limits unless otherwise noted below.   Relevant physical exam findings are noted in the Assessment and Plan.    Assessment & Plan   SKIN CANCER SCREENING PERFORMED TODAY.  ACTINIC DAMAGE - Chronic condition, secondary to cumulative UV/sun exposure - diffuse scaly erythematous macules with underlying dyspigmentation - Recommend daily broad spectrum sunscreen SPF 30+ to sun-exposed areas, reapply every 2 hours as needed.  - Staying in the shade or wearing long sleeves, sun glasses (UVA+UVB protection) and wide brim hats (4-inch brim around the entire circumference of the hat) are also recommended for sun protection.  - Call for new or changing lesions.  LENTIGINES, SEBORRHEIC KERATOSES, HEMANGIOMAS - Benign normal skin lesions - Benign-appearing - Call for any changes  MELANOCYTIC NEVI - Tan-brown and/or pink-flesh-colored symmetric macules and papules - R posterior flank- 5 x  4mm brown macule  - R lower back - 2.39mm med dark brown macule Stable compared to photos 11/11/2022 - Benign appearing on exam today - Observation - Call clinic for new or changing moles - Recommend daily use of broad spectrum spf 30+ sunscreen to sun-exposed areas.   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 She takes coumadin  chronically. Exam: Yellow brown violaceous patches on the left lower leg; mild edema.  Chronic condition with duration or expected duration over one year. Currently well-controlled.   Stasis in the legs causes chronic leg swelling, which may result in itchy or painful rashes, skin discoloration, skin texture changes, and sometimes ulceration.  Recommend daily graduated compression hose/stockings- easiest to put on first thing in morning, remove at bedtime.  Elevate legs as much as possible. Avoid salt/sodium rich foods.  Treatment Plan: Recommend daily compression hose, pt defers Start Pimecrolimus  cream to AA legs twice daily until rash improved.  Continue TMC 0.1% Cream but use only as needed QD/BID to more severe outbreaks, Avoid face, groin, axilla. Caution skin atrophy with long-term use. Patient will call for refills.  Topical steroids (such as triamcinolone , fluocinolone, fluocinonide, mometasone, clobetasol , halobetasol, betamethasone , hydrocortisone ) can cause thinning and lightening of the skin if they are used for too long in the same area. Your physician has selected the right strength medicine for your problem and area affected on the body. Please use your medication only as directed by your physician to prevent side effects  INTERTRIGO Exam: Erythematous macerated patches in bilateral medial inguinal folds  Chronic and persistent condition with duration or expected duration over one year. Condition is bothersome/symptomatic for patient. Currently flared.  Intertrigo is a chronic recurrent rash that occurs in skin fold areas  that may be associated with friction; heat; moisture; yeast; fungus; and bacteria.  It is exacerbated by increased movement / activity; sweating; and higher atmospheric temperature.  Use of an absorbant powder such as Zeasorb AF powder or other OTC antifungal powder to the area daily can prevent rash recurrence. Other options to help keep the area dry include blow drying the area after bathing or using antiperspirant products such as Duradry sweat minimizing gel.  Treatment Plan: Start ketoconazole  2% cream Apply to AA rash once to twice daily. May continue daily as preventative. 3Rf. Start pimecrolimus  cream Apply to AA rash once to twice daily until improved. 3Rf. Start mupirocin  2% ointment apply twice daily to open areas/sores until healed. 3Rf  Return in about 1 year (around 11/17/2024) for TBSE.  IAndrea Kerns, CMA, am acting as scribe for Rexene Rattler, MD .   Documentation: I have reviewed the above documentation for accuracy and completeness, and I agree with the above.  Rexene Rattler, MD

## 2023-11-18 NOTE — Telephone Encounter (Signed)
 Attempted to call for post procedure follow-up. Message left.

## 2023-11-19 ENCOUNTER — Ambulatory Visit: Payer: MEDICAID

## 2023-11-19 DIAGNOSIS — Z7901 Long term (current) use of anticoagulants: Secondary | ICD-10-CM | POA: Diagnosis not present

## 2023-11-19 LAB — POCT INR
INR: 1.9 — AB (ref 2.0–3.0)
POC INR: 1.9

## 2023-11-20 ENCOUNTER — Other Ambulatory Visit: Payer: Self-pay | Admitting: Family Medicine

## 2023-11-20 ENCOUNTER — Ambulatory Visit (INDEPENDENT_AMBULATORY_CARE_PROVIDER_SITE_OTHER): Payer: MEDICAID | Admitting: Family Medicine

## 2023-11-20 VITALS — BP 113/64 | HR 61 | Temp 97.6°F | Ht 68.0 in | Wt 228.0 lb

## 2023-11-20 DIAGNOSIS — E1159 Type 2 diabetes mellitus with other circulatory complications: Secondary | ICD-10-CM

## 2023-11-20 DIAGNOSIS — I152 Hypertension secondary to endocrine disorders: Secondary | ICD-10-CM

## 2023-11-20 DIAGNOSIS — E1165 Type 2 diabetes mellitus with hyperglycemia: Secondary | ICD-10-CM | POA: Diagnosis not present

## 2023-11-20 DIAGNOSIS — Z7985 Long-term (current) use of injectable non-insulin antidiabetic drugs: Secondary | ICD-10-CM

## 2023-11-20 DIAGNOSIS — E669 Obesity, unspecified: Secondary | ICD-10-CM

## 2023-11-20 DIAGNOSIS — Z6834 Body mass index (BMI) 34.0-34.9, adult: Secondary | ICD-10-CM

## 2023-11-20 MED ORDER — ENOXAPARIN SODIUM 100 MG/ML IJ SOSY: 100.0000 mg | PREFILLED_SYRINGE | Freq: Two times a day (BID) | INTRAMUSCULAR | 0 refills | Status: DC

## 2023-11-20 NOTE — Assessment & Plan Note (Signed)
Patient recently restarted lisinopril 20mg .  No side effects reported by patient of this medication (no cough, lip swelling).  Blood pressure well controlled today.  No chest pain, chest pressure or headache.  Continue current med and follow up on BP at next appt- may have room to decrease dose in the future.

## 2023-11-20 NOTE — Assessment & Plan Note (Signed)
Doing well semaglutide, lisinopril and lipitor. Continue current meds at same doses.  Patient to get blood work with PCP at end of this month.

## 2023-11-20 NOTE — Progress Notes (Signed)
   SUBJECTIVE:  Chief Complaint: Obesity  Interim History: Patient had a good and quiet holiday season.  January has been alright- she has seasonal depression.  She is trying to get outside and do some activity to get thru the day. Patient feels like she is doing well on her meal plan.  She is focused on getting all her calories in and getting in her goal protein.  She is starting to get sick and tired of eggs. This next month she is anticipating less doctors appointments.   Jane [name] is here to discuss her progress with her obesity treatment plan. She is on the Category 3 Plan and keeping a food journal and adhering to recommended goals of 1450-1600 calories and 95 grams of protein and states she is following her eating plan approximately 75 % of the time. She states she is walking 8,000 steps and working outside 7 times per week.   OBJECTIVE: Visit Diagnoses: Problem List Items Addressed This Visit       Cardiovascular and Mediastinum   Hypertension associated with diabetes (HCC) - Primary (Chronic)   Patient recently restarted lisinopril 20mg .  No side effects reported by patient of this medication (no cough, lip swelling).  Blood pressure well controlled today.  No chest pain, chest pressure or headache.  Continue current med and follow up on BP at next appt- may have room to decrease dose in the future.        Endocrine   Type 2 diabetes mellitus with hyperglycemia (HCC)   Doing well semaglutide, lisinopril and lipitor. Continue current meds at same doses.  Patient to get blood work with PCP at end of this month.       Vitals Temp: 97.6 F (36.4 C) BP: 113/64 Pulse Rate: 61 SpO2: 97 %   Anthropometric Measurements Height: 5\' 8"  (1.727 m) Weight: 228 lb (103.4 kg) BMI (Calculated): 34.68 Weight at Last Visit: 229 lb Weight Lost Since Last Visit: 1 Weight Gained Since Last Visit: 0 Starting Weight: 283 lb Total Weight Loss (lbs): 55 lb (24.9 kg)   Body Composition  Body  Fat %: 46.9 % Fat Mass (lbs): 107.2 lbs Muscle Mass (lbs): 115.2 lbs Total Body Water (lbs): 84.6 lbs Visceral Fat Rating : 13   Other Clinical Data Today's Visit #: 45 Starting Date: 03/29/20     ASSESSMENT AND PLAN:  Diet: Jane [name] is currently in the action stage of change. As such, her goal is to continue with weight loss efforts. She has agreed to Category 3 Plan and keeping a food journal and adhering to recommended goals of 1450-1600 calories and 95 or more grams of protein daily.  Exercise: Jane [name] has been instructed to work up to a goal of 150 minutes of combined cardio and strengthening exercise per week and to continue exercising as is for weight loss and overall health benefits.   Behavior Modification:  We discussed the following Behavioral Modification Strategies today: increasing lean protein intake, increasing vegetables, meal planning and cooking strategies, and planning for success.   No follow-ups on file.Marland Kitchen She was informed of the importance of frequent follow up visits to maximize her success with intensive lifestyle modifications for her multiple health conditions.  Attestation Statements:   Reviewed by clinician on day of visit: allergies, medications, problem list, medical history, surgical history, family history, social history, and previous encounter notes.     Reuben Likes, MD

## 2023-11-20 NOTE — Patient Instructions (Signed)
Description   Patient to take 5 mg daily except  7.5 mg M/W/F.  She is to take her last dose of Coumadin on 11/28/23 and start Lovenox on 11/29/23.  She is to take the Lovenox through 12/02/23.  She should not take any Lovenox or Coumadin on 12/03/23 (day of surgery).  She is to re-start Lovenox and Coumadin on 12/04/23. Coumadin being what ever dose falls for that day.  She is to take her last Lovenox on 12/09/23 and have Coumadin checked on 12/10/23

## 2023-11-26 ENCOUNTER — Telehealth (HOSPITAL_COMMUNITY): Payer: MEDICAID | Admitting: Psychiatry

## 2023-11-26 ENCOUNTER — Encounter (HOSPITAL_COMMUNITY): Payer: Self-pay | Admitting: Psychiatry

## 2023-11-26 DIAGNOSIS — F3130 Bipolar disorder, current episode depressed, mild or moderate severity, unspecified: Secondary | ICD-10-CM | POA: Diagnosis not present

## 2023-11-26 DIAGNOSIS — F900 Attention-deficit hyperactivity disorder, predominantly inattentive type: Secondary | ICD-10-CM

## 2023-11-26 DIAGNOSIS — F5105 Insomnia due to other mental disorder: Secondary | ICD-10-CM

## 2023-11-26 MED ORDER — AMPHETAMINE-DEXTROAMPHETAMINE 30 MG PO TABS: 30.0000 mg | ORAL_TABLET | Freq: Two times a day (BID) | ORAL | 0 refills | Status: DC

## 2023-11-26 MED ORDER — TRAZODONE HCL 150 MG PO TABS: 150.0000 mg | ORAL_TABLET | Freq: Every day | ORAL | 2 refills | Status: DC

## 2023-11-26 MED ORDER — ALPRAZOLAM 1 MG PO TABS: 1.0000 mg | ORAL_TABLET | Freq: Two times a day (BID) | ORAL | 2 refills | Status: DC | PRN

## 2023-11-26 MED ORDER — FLUOXETINE HCL 40 MG PO CAPS: 40.0000 mg | ORAL_CAPSULE | Freq: Two times a day (BID) | ORAL | 2 refills | Status: DC

## 2023-11-26 MED ORDER — AMPHETAMINE-DEXTROAMPHETAMINE 30 MG PO TABS: 1.0000 | ORAL_TABLET | Freq: Two times a day (BID) | ORAL | 0 refills | Status: DC

## 2023-11-26 NOTE — Progress Notes (Signed)
Virtual Visit via Video Note  I connected with Jane [name] on 11/26/23 at  9:00 AM EST by a video enabled telemedicine application and verified that I am speaking with the correct person using two identifiers.  Location: Patient: home Provider: office   I discussed the limitations of evaluation and management by telemedicine and the availability of in person appointments. The    I discussed the assessment and treatment plan with the patient. The patient was provided an opportunity to ask questions and all were answered. The patient agreed with the plan and demonstrated an understanding of the instructions.   The patient was advised to call back or seek an in-person evaluation if the symptoms worsen or if the condition fails to improve as anticipated.  I provided 20 minutes of non-face-to-face time during this encounter.   Diannia Ruder, MD  [facility] MD/PA/NP OP Progress Note  11/26/2023 9:16 AM Jane [name]  MRN:  [id number]  Chief Complaint:  Chief Complaint  Patient presents with   Anxiety   ADD   Depression   HPI:  This patient is a 58 year old divorced white female who lives alone in [place]. She is on disability due to bipolar disorder.   The patient returns for follow-up after 3 months regarding her anxiety depression mood swings and ADD.  For the most part she is doing okay.  She still cannot afford a vehicle so she is basically stuck at home her DSS worker takes her to appointments and occasionally a friend will come over.  She is still not talking to her children nor they to her.  She claims that she is okay with this.  The patient denies significant depression.  Her mood has been stable and she denies thoughts of self-harm or suicide the Xanax continues to help her anxiety.  She is focusing well with the Adderall.  She is sleeping well with the trazodone.  She denies any new health concerns but still has significant knee pain. Visit Diagnosis:    ICD-10-CM   1. Bipolar I  disorder, most recent episode depressed (HCC)  F31.30     2. Attention deficit hyperactivity disorder (ADHD), predominantly inattentive type  F90.0     3. Insomnia due to mental disorder  F51.05       Past Psychiatric History: Long-term outpatient treatment  Past Medical History:  Past Medical History:  Diagnosis Date   ADD (attention deficit disorder)    Anxiety    Back pain    Bilateral swelling of feet    Bipolar 1 disorder (HCC)    Bipolar 1 disorder (HCC)    Bipolar disorder (HCC)    Chewing difficulty    Chronic fatigue syndrome    Chronic kidney disease    Stage 3 kidney disease;dx by Dr. Kari Baars.    Constipation    Depression    Diabetes mellitus without complication (HCC)    diet controlled   Dyspnea    with exertion   GERD (gastroesophageal reflux disease)    Headache    migraines   High cholesterol    History of blood clots    History of DVT (deep vein thrombosis)    left leg   Hypertension    states under control with meds., has been on med. x 2 years   Hypothyroidism    Joint pain    Neuropathy    Obsessive-compulsive disorder    Peripheral vascular disease (HCC)    Prediabetes    Respiratory failure requiring intubation (HCC)  Restless leg syndrome    Shortness of breath    Sleep apnea    Thrombocytopenia (HCC) 09/15/2019   Trigger thumb of left hand 01/2018   Trigger thumb of right hand     Past Surgical History:  Procedure Laterality Date   ABDOMINAL HYSTERECTOMY  06/2016   complete   APPLICATION OF WOUND VAC Left 04/27/2019   Procedure: APPLICATION OF WOUND VAC LEFT GROIN;  Surgeon: Maeola Harman, MD;  Location: [facility] OR;  Service: Vascular;  Laterality: Left;   AV FISTULA PLACEMENT Left 11/24/2018   Procedure: ARTERIOVENOUS (AV) FISTULA CREATION LEFT SFA TO LEFT FEMORAL VEIN;  Surgeon: Maeola Harman, MD;  Location: [facility] OR;  Service: Vascular;  Laterality: Left;   BACK SURGERY     CHOLECYSTECTOMY      COLONOSCOPY WITH PROPOFOL N/A 11/22/2019   Procedure: COLONOSCOPY WITH PROPOFOL;  Surgeon: Wyline Mood, MD;  Location: [facility] ENDOSCOPY;  Service: Gastroenterology;  Laterality: N/A;   COLONOSCOPY WITH PROPOFOL N/A 12/09/2022   Procedure: COLONOSCOPY WITH PROPOFOL;  Surgeon: Wyline Mood, MD;  Location: [facility] ENDOSCOPY;  Service: Gastroenterology;  Laterality: N/A;   FEMORAL ARTERY EXPLORATION  03/30/2019   Procedure: Left Common Femoral Artery and Vein Exploration;  Surgeon: Maeola Harman, MD;  Location: [facility] OR;  Service: Vascular;;   FEMORAL-FEMORAL BYPASS GRAFT Left 11/24/2018   Procedure: BYPASS GRAFT FEMORAL-FEMORAL VENOUS LEFT TO RIGHT PALMA PROCEDURE USING CRYOVEIN;  Surgeon: Maeola Harman, MD;  Location: [facility] OR;  Service: Vascular;  Laterality: Left;   GROIN DEBRIDEMENT Left 04/27/2019   Procedure: GROIN DEBRIDEMENT;  Surgeon: Maeola Harman, MD;  Location: [facility] OR;  Service: Vascular;  Laterality: Left;   INSERTION OF ILIAC STENT  03/30/2019   Procedure: Stent of left common, external iliac veins and left common femoral vein;  Surgeon: Maeola Harman, MD;  Location: [facility] OR;  Service: Vascular;;   KNEE ARTHROSCOPY WITH MENISCAL REPAIR Left 11/14/2020   Procedure: LEFT KNEE ARTHROSCOPY WITH PARTIAL MEDIAL MENISCECTOMY;  Surgeon: Vickki Hearing, MD;  Location: [facility] ORS;  Service: Orthopedics;  Laterality: Left;   LOWER EXTREMITY VENOGRAPHY N/A 08/17/2018   Procedure: LOWER EXTREMITY VENOGRAPHY - Central Venogram;  Surgeon: Maeola Harman, MD;  Location: [facility] INVASIVE CV LAB;  Service: Cardiovascular;  Laterality: N/A;   LOWER EXTREMITY VENOGRAPHY Bilateral 03/09/2019   Procedure: LOWER EXTREMITY VENOGRAPHY;  Surgeon: Maeola Harman, MD;  Location: [facility] INVASIVE CV LAB;  Service: Cardiovascular;  Laterality: Bilateral;   LOWER EXTREMITY VENOGRAPHY Left 08/16/2019   Procedure: LOWER EXTREMITY VENOGRAPHY;  Surgeon: Maeola Harman, MD;   Location: [facility] INVASIVE CV LAB;  Service: Cardiovascular;  Laterality: Left;   LOWER EXTREMITY VENOGRAPHY Left 03/25/2022   Procedure: LOWER EXTREMITY VENOGRAPHY;  Surgeon: Maeola Harman, MD;  Location: [facility] INVASIVE CV LAB;  Service: Cardiovascular;  Laterality: Left;  IVUS   LUMBAR FUSION  11/21/2000   L5-S1   LUMBAR SPINE SURGERY     x 2 others   PATCH ANGIOPLASTY Left 03/30/2019   Procedure: Patch Angioplasty of the Left Common Femoral Vein using Venosure Biologic patch;  Surgeon: Maeola Harman, MD;  Location: [facility] OR;  Service: Vascular;  Laterality: Left;   PERIPHERAL VASCULAR INTERVENTION Left 08/16/2019   Procedure: PERIPHERAL VASCULAR INTERVENTION;  Surgeon: Maeola Harman, MD;  Location: [facility] INVASIVE CV LAB;  Service: Cardiovascular;  Laterality: Left;  common femoral/femoral vein stent   PERIPHERAL VASCULAR INTERVENTION Left 03/25/2022   Procedure: PERIPHERAL VASCULAR INTERVENTION;  Surgeon: Maeola Harman, MD;  Location: [facility] INVASIVE CV LAB;  Service: Cardiovascular;  Laterality: Left;  COMMON FEMORAL VEIN   PERIPHERAL VASCULAR THROMBECTOMY Left 03/25/2022   Procedure: PERIPHERAL VASCULAR THROMBECTOMY;  Surgeon: Maeola Harman, MD;  Location: [facility] INVASIVE CV LAB;  Service: Cardiovascular;  Laterality: Left;  EXTREMITY/IVC   TRIGGER FINGER RELEASE Right 12/01/2017   Procedure: RELEASE TRIGGER FINGER/A-1 PULLEY RIGHT THUMB;  Surgeon: Betha Loa, MD;  Location: [facility];  Service: Orthopedics;  Laterality: Right;   TRIGGER FINGER RELEASE Left 01/26/2018   Procedure: LEFT TRIGGER THUMB RELEASE;  Surgeon: Betha Loa, MD;  Location: [facility];  Service: Orthopedics;  Laterality: Left;   ULTRASOUND GUIDANCE FOR VASCULAR ACCESS Right 03/30/2019   Procedure: Ultrasound-guided cannulation right internal jugular vein;  Surgeon: Maeola Harman, MD;  Location: [facility] OR;  Service: Vascular;  Laterality: Right;     Family Psychiatric History: See below  Family History:  Family History  Problem Relation Age of Onset   Heart disease Mother    Hyperlipidemia Mother    Hypertension Mother    Bipolar disorder Mother    Stroke Mother    Depression Mother    Sleep apnea Mother    Obesity Mother    Diabetes Father    Heart disease Father    Hyperlipidemia Father    Hypertension Father    Sleep apnea Father    Obesity Father    Drug abuse Daughter    ADD / ADHD Daughter    Drug abuse Daughter    Anxiety disorder Daughter    Bipolar disorder Daughter    Hypertension Sister    Hypertension Brother    Hyperlipidemia Brother    Heart disease Brother    Bipolar disorder Maternal Aunt    Suicidality Maternal Aunt     Social History:  Social History   Socioeconomic History   Marital status: Divorced    Spouse name: Not on file   Number of children: 3   Years of education: Not on file   Highest education level: 10th grade  Occupational History   Not on file  Tobacco Use   Smoking status: Never   Smokeless tobacco: Never  Vaping Use   Vaping status: Never Used  Substance and Sexual Activity   Alcohol use: No   Drug use: No   Sexual activity: Not Currently    Partners: Male    Birth control/protection: Surgical  Other Topics Concern   Not on file  Social History Narrative   Not on file   Social Drivers of Health   Financial Resource Strain: Medium Risk (09/30/2023)   Overall Financial Resource Strain (CARDIA)    Difficulty of Paying Living Expenses: Somewhat hard  Food Insecurity: Food Insecurity Present (09/30/2023)   Hunger Vital Sign    Worried About Running Out of Food in the Last Year: Often true    Ran Out of Food in the Last Year: Often true  Transportation Needs: Unmet Transportation Needs (09/30/2023)   PRAPARE - Transportation    Lack of Transportation (Medical): Yes    Lack of Transportation (Non-Medical): Yes  Physical Activity: Sufficiently Active  (09/30/2023)   Exercise Vital Sign    Days of Exercise per Week: 6 days    Minutes of Exercise per Session: 40 min  Stress: No Stress Concern Present (09/30/2023)   [employer] of Occupational Health - Occupational Stress Questionnaire    Feeling of Stress : Not at all  Social Connections: Socially Isolated (09/30/2023)  Social Advertising account executive [NHANES]    Frequency of Communication with Friends and Family: More than three times a week    Frequency of Social Gatherings with Friends and Family: Once a week    Attends Religious Services: Never    Database administrator or Organizations: No    Attends Engineer, structural: Not on file    Marital Status: Divorced    Allergies:  Allergies  Allergen Reactions   Aripiprazole Other (See Comments)    BECOMES  VIOLENT    Seroquel [Quetiapine Fumarate] Other (See Comments)    BECOMES VIOLENT   Chlorpromazine Other (See Comments)    SEVERE ANXIETY    Gabapentin Other (See Comments)    NIGHTMARES    Prednisone Hives   Quetiapine     Metabolic Disorder Labs: Lab Results  Component Value Date   HGBA1C 5.7 (H) 05/09/2023   MPG 131.24 11/06/2021   MPG 134.11 03/26/2019   No results found for: "PROLACTIN" Lab Results  Component Value Date   CHOL 177 05/09/2023   TRIG 115 05/09/2023   HDL 47 05/09/2023   CHOLHDL 3.8 05/09/2023   VLDL 37 11/06/2021   LDLCALC 109 (H) 05/09/2023   LDLCALC 105 (H) 11/07/2022   Lab Results  Component Value Date   TSH 1.440 11/07/2022   TSH 1.420 08/06/2022    Therapeutic Level Labs: No results found for: "LITHIUM" Lab Results  Component Value Date   VALPROATE 61.2 03/23/2014   No results found for: "CBMZ"  Current Medications: Current Outpatient Medications  Medication Sig Dispense Refill   amphetamine-dextroamphetamine (ADDERALL) 30 MG tablet Take 1 tablet by mouth 2 (two) times daily. 60 tablet 0   ALPRAZolam (XANAX) 1 MG tablet Take 1 tablet (1 mg total)  by mouth 2 (two) times daily as needed. for anxiety 60 tablet 2   amphetamine-dextroamphetamine (ADDERALL) 30 MG tablet Take 1 tablet by mouth 2 (two) times daily. 60 tablet 0   amphetamine-dextroamphetamine (ADDERALL) 30 MG tablet Take 1 tablet by mouth 2 (two) times daily. 60 tablet 0   amphetamine-dextroamphetamine (ADDERALL) 30 MG tablet Take 1 tablet by mouth 2 (two) times daily. 60 tablet 0   aspirin EC 81 MG tablet Take 81 mg by mouth daily.     atorvastatin (LIPITOR) 40 MG tablet Take 1 tablet (40 mg total) by mouth daily. 90 tablet 3   cetirizine (ZYRTEC) 10 MG tablet Take 1 tablet (10 mg total) by mouth daily. 90 tablet 3   enoxaparin (LOVENOX) 100 MG/ML injection Inject 1 mL (100 mg total) into the skin every 12 (twelve) hours for 10 days. 20 mL 0   estradiol (ESTRACE VAGINAL) 0.1 MG/GM vaginal cream Apply 0.5mg  (pea-sized amount)  just inside the vaginal introitus with a finger-tip on Monday, Wednesday and Friday nights. 30 g 12   famotidine (PEPCID) 20 MG tablet Take 20 mg by mouth daily.     FLUoxetine (PROZAC) 40 MG capsule Take 1 capsule (40 mg total) by mouth 2 (two) times daily. 180 capsule 2   GARLIC PO Take 1 tablet by mouth daily.     HYDROcodone-acetaminophen (NORCO) 10-325 MG tablet Take 1 tablet by mouth every 6 (six) hours as needed. To last 30 days from fill date 120 tablet 0   ketoconazole (NIZORAL) 2 % cream Apply to affected areas rash in body folds once to twice daily until improved. May use daily as a preventative. 60 g 3   levothyroxine (SYNTHROID) 125 MCG tablet Take  1 tablet (125 mcg total) by mouth daily before breakfast. 90 tablet 3   linaclotide (LINZESS) 145 MCG CAPS capsule Take 1 capsule (145 mcg total) by mouth daily. 90 capsule 3   lisinopril (ZESTRIL) 20 MG tablet Take 1 tablet (20 mg total) by mouth daily. 90 tablet 3   methocarbamol (ROBAXIN) 750 MG tablet Take 1 tablet (750 mg total) by mouth 2 (two) times daily as needed for muscle spasms. 60 tablet 5    Multiple Vitamin (MULTIVITAMIN) capsule Take 1 capsule by mouth daily.     mupirocin ointment (BACTROBAN) 2 % Apply to open areas/sores twice daily until healed. 22 g 3   nitrofurantoin (MACRODANTIN) 100 MG capsule Take 1 capsule (100 mg total) by mouth daily. 30 capsule 11   omega-3 acid ethyl esters (LOVAZA) 1 g capsule Take by mouth.     pantoprazole (PROTONIX) 40 MG tablet Take 1 tablet (40 mg total) by mouth daily. 90 tablet 3   pimecrolimus (ELIDEL) 1 % cream Apply to affected areas rash in body folds and legs once to twice daily until improved. 30 g 3   polyethylene glycol powder (GLYCOLAX/MIRALAX) 17 GM/SCOOP powder TAKE 17 Gram BY MOUTH DAILY 850 g 1   potassium chloride (KLOR-CON M) 10 MEQ tablet Take 1 tablet (10 mEq total) by mouth 3 (three) times daily. 270 tablet 3   pregabalin (LYRICA) 100 MG capsule Take 1 capsule (100 mg total) by mouth 4 (four) times daily. 120 capsule 5   rizatriptan (MAXALT-MLT) 10 MG disintegrating tablet Take 10 mg by mouth as needed for migraine.     rOPINIRole (REQUIP) 2 MG tablet Take 1 tablet (2 mg total) by mouth 2 (two) times daily. 180 tablet 1   Semaglutide, 2 MG/DOSE, 8 MG/3ML SOPN Inject 2 mg as directed once a week. 9 mL 0   traZODone (DESYREL) 150 MG tablet Take 1 tablet (150 mg total) by mouth at bedtime. 30 tablet 2   valACYclovir (VALTREX) 1000 MG tablet Take 2 tablets by mouth at first onset of fever blisters, then take 2 tablets by mouth 12 hours later. 30 tablet 3   Vibegron (GEMTESA) 75 MG TABS Take 1 tablet (75 mg total) by mouth daily. 90 tablet 3   warfarin (COUMADIN) 5 MG tablet TAKE 1 TABLET BY MOUTH DAILY 60 tablet 3   No current facility-administered medications for this visit.     Musculoskeletal: Strength & Muscle Tone: within normal limits Gait & Station: normal Patient leans: N/A  Psychiatric Specialty Exam: Review of Systems  Musculoskeletal:  Positive for arthralgias.  All other systems reviewed and are negative.    There were no vitals taken for this visit.There is no height or weight on file to calculate BMI.  General Appearance: Casual and Fairly Groomed  Eye Contact:  Good  Speech:  Clear and Coherent  Volume:  Normal  Mood:  Euthymic  Affect:  Congruent  Thought Process:  Goal Directed  Orientation:  Full (Time, Place, and Person)  Thought Content: WDL   Suicidal Thoughts:  No  Homicidal Thoughts:  No  Memory:  Immediate;   Good Recent;   Good Remote;   Good  Judgement:  Good  Insight:  Good  Psychomotor Activity:  Decreased  Concentration:  Concentration: Good and Attention Span: Good  Recall:  Good  Fund of Knowledge: Good  Language: Good  Akathisia:  No  Handed:  Right  AIMS (if indicated): not done  Assets:  Communication Skills Desire for Improvement  Resilience  ADL's:  Intact  Cognition: WNL  Sleep:  Good   Screenings: GAD-7    Flowsheet Row Office Visit from 10/06/2023 in [facility]  Total GAD-7 Score 5      PHQ2-9    Flowsheet Row Procedure visit from 10/13/2023 in [name] Visit from 10/06/2023 in [facility] Office Visit from 05/09/2023 in [facility] Office Visit from 03/14/2023 in [facility] Video Visit from 01/02/2023 in [facility] Total Score 1 2 2 4 5   PHQ-9 Total Score -- 11 10 18 15       Flowsheet Row ED from 01/30/2023 in [facility] Video Visit from 01/02/2023 in [facility] [place] Admission (Discharged) from 12/09/2022 in [facility]  C-SSRS RISK CATEGORY No Risk No Risk No Risk        Assessment and Plan: This patient is a 58 year old female with a history of depression anxiety ADD.  She continues to do well on her  current regimen.  She will continue Prozac 80 mg daily for depression, Xanax 1 mg twice daily as needed for anxiety, Adderall 30 mg twice daily for ADD and trazodone 150 mg at bedtime for sleep.  She will return to see me in 3 months  Collaboration of Care: Collaboration of Care: Primary Care Provider AEB notes will be shared with PCP at patient's request  Patient/Guardian was advised Release of Information must be obtained prior to any record release in order to collaborate their care with an outside provider. Patient/Guardian was advised if they have not already done so to contact the registration department to sign all necessary forms in order for [place] to release information regarding their care.   Consent: Patient/Guardian gives verbal consent for treatment and assignment of benefits for services provided during this visit. Patient/Guardian expressed understanding and agreed to proceed.    Diannia Ruder, MD 11/26/2023, 9:16 AM

## 2023-11-29 ENCOUNTER — Other Ambulatory Visit: Payer: Self-pay | Admitting: Family Medicine

## 2023-12-01 NOTE — Telephone Encounter (Signed)
Requested Prescriptions  Pending Prescriptions Disp Refills   rOPINIRole (REQUIP) 2 MG tablet [Pharmacy Med Name: ropinirole 2 mg tablet] 180 tablet 1    Sig: TAKE 1 TABLET BY MOUTH TWICE DAILY     Neurology:  Parkinsonian Agents Passed - 12/01/2023  2:43 PM      Passed - Last BP in normal range    BP Readings from Last 1 Encounters:  11/20/23 113/64         Passed - Last Heart Rate in normal range    Pulse Readings from Last 1 Encounters:  11/20/23 61         Passed - Valid encounter within last 12 months    Recent Outpatient Visits           1 month ago Dysuria   [facility] [place], Marzella Schlein, MD   6 months ago Hypertension associated with diabetes [facility])   [facility] Kelly, Marzella Schlein, MD   8 months ago History of recurrent deep vein thrombosis (DVT)   [place] [facility] Flock, Marzella Schlein, MD   8 months ago Cystitis   [facility] [place], Marzella Schlein, MD   1 year ago Encounter for annual health examination   [facility] [place], Marzella Schlein, MD       Future Appointments             Tomorrow Bacigalupo, Marzella Schlein, MD [facility]   In 10 months MacDiarmid, Lorin Picket, MD [facility] [place]   In 10 months MacDiarmid, Lorin Picket, MD [facility]

## 2023-12-02 ENCOUNTER — Ambulatory Visit: Payer: MEDICAID | Admitting: Family Medicine

## 2023-12-02 ENCOUNTER — Encounter: Payer: Self-pay | Admitting: Family Medicine

## 2023-12-02 VITALS — BP 116/67 | HR 58 | Ht 70.0 in | Wt 237.3 lb

## 2023-12-02 DIAGNOSIS — E038 Other specified hypothyroidism: Secondary | ICD-10-CM

## 2023-12-02 DIAGNOSIS — E785 Hyperlipidemia, unspecified: Secondary | ICD-10-CM

## 2023-12-02 DIAGNOSIS — N1831 Chronic kidney disease, stage 3a: Secondary | ICD-10-CM

## 2023-12-02 DIAGNOSIS — E1122 Type 2 diabetes mellitus with diabetic chronic kidney disease: Secondary | ICD-10-CM

## 2023-12-02 DIAGNOSIS — N183 Chronic kidney disease, stage 3 unspecified: Secondary | ICD-10-CM

## 2023-12-02 DIAGNOSIS — Z0000 Encounter for general adult medical examination without abnormal findings: Secondary | ICD-10-CM

## 2023-12-02 DIAGNOSIS — Z0001 Encounter for general adult medical examination with abnormal findings: Secondary | ICD-10-CM | POA: Diagnosis not present

## 2023-12-02 DIAGNOSIS — K5903 Drug induced constipation: Secondary | ICD-10-CM | POA: Insufficient documentation

## 2023-12-02 DIAGNOSIS — E1169 Type 2 diabetes mellitus with other specified complication: Secondary | ICD-10-CM

## 2023-12-02 DIAGNOSIS — E1159 Type 2 diabetes mellitus with other circulatory complications: Secondary | ICD-10-CM | POA: Diagnosis not present

## 2023-12-02 DIAGNOSIS — I152 Hypertension secondary to endocrine disorders: Secondary | ICD-10-CM

## 2023-12-02 DIAGNOSIS — E1142 Type 2 diabetes mellitus with diabetic polyneuropathy: Secondary | ICD-10-CM | POA: Diagnosis not present

## 2023-12-02 MED ORDER — LISINOPRIL 10 MG PO TABS: 10.0000 mg | ORAL_TABLET | Freq: Every day | ORAL | 1 refills | Status: DC

## 2023-12-02 MED ORDER — LINACLOTIDE 290 MCG PO CAPS: 290.0000 ug | ORAL_CAPSULE | Freq: Every day | ORAL | 2 refills | Status: DC

## 2023-12-02 MED ORDER — POLYETHYLENE GLYCOL 3350 17 GM/SCOOP PO POWD: 1.0000 | Freq: Once | ORAL | 0 refills | Status: AC

## 2023-12-02 NOTE — Progress Notes (Signed)
Complete physical exam   Patient: Jane [name]   DOB: April 25, 1966   58 y.o. Female  MRN: [id number] Visit Date: 12/02/2023  Today's healthcare provider: Shirlee Latch, MD   Chief Complaint  Patient presents with   Annual Exam    Last complete 11/07/22 Diet - General, well balanced Exercise - 6000 steps daily Feeling - Well Sleeping - fairly well Concerns - Patient would like to discuss injections in her stomach and reports site feels hot and red. Also taking rx to help bowel movements but it is not working.    Subjective    Jane [name] is a 58 y.o. female who presents today for a complete physical exam.   Discussed the use of AI scribe software for clinical note transcription with the patient, who gave verbal consent to proceed.  History of Present Illness   The patient, with a history of hypertension and chronic constipation, presents with severe constipation lasting for eight days. Despite taking Linzess and miralax, the patient reports no relief. Did have a BM yesterday, but still feeling constipated.  The patient describes the constipation as causing significant discomfort, with a swollen stomach and fatigue. The patient also reports taking Lovenox for anticoagulation and has been experiencing significant bruising. The patient has a history of back pain and is considering a stimulator implant.       Last depression screening scores    10/13/2023    1:04 PM 10/06/2023    2:05 PM 05/09/2023    9:28 AM  PHQ 2/9 Scores  PHQ - 2 Score 1 2 2   PHQ- 9 Score  11 10   Last fall risk screening    11/17/2023   10:06 AM  Fall Risk   Falls in the past year? 0        Medications: Outpatient Medications Prior to Visit  Medication Sig   ALPRAZolam (XANAX) 1 MG tablet Take 1 tablet (1 mg total) by mouth 2 (two) times daily as needed. for anxiety   amphetamine-dextroamphetamine (ADDERALL) 30 MG tablet Take 1 tablet by mouth 2 (two) times daily.    amphetamine-dextroamphetamine (ADDERALL) 30 MG tablet Take 1 tablet by mouth 2 (two) times daily.   amphetamine-dextroamphetamine (ADDERALL) 30 MG tablet Take 1 tablet by mouth 2 (two) times daily.   amphetamine-dextroamphetamine (ADDERALL) 30 MG tablet Take 1 tablet by mouth 2 (two) times daily.   aspirin EC 81 MG tablet Take 81 mg by mouth daily.   atorvastatin (LIPITOR) 40 MG tablet Take 1 tablet (40 mg total) by mouth daily.   cetirizine (ZYRTEC) 10 MG tablet Take 1 tablet (10 mg total) by mouth daily.   estradiol (ESTRACE VAGINAL) 0.1 MG/GM vaginal cream Apply 0.5mg  (pea-sized amount)  just inside the vaginal introitus with a finger-tip on Monday, Wednesday and Friday nights.   famotidine (PEPCID) 20 MG tablet Take 20 mg by mouth daily.   FLUoxetine (PROZAC) 40 MG capsule Take 1 capsule (40 mg total) by mouth 2 (two) times daily.   GARLIC PO Take 1 tablet by mouth daily.   HYDROcodone-acetaminophen (NORCO) 10-325 MG tablet Take 1 tablet by mouth every 6 (six) hours as needed. To last 30 days from fill date   ketoconazole (NIZORAL) 2 % cream Apply to affected areas rash in body folds once to twice daily until improved. May use daily as a preventative.   levothyroxine (SYNTHROID) 125 MCG tablet Take 1 tablet (125 mcg total) by mouth daily before breakfast.   methocarbamol (ROBAXIN) 750 MG  tablet Take 1 tablet (750 mg total) by mouth 2 (two) times daily as needed for muscle spasms.   Multiple Vitamin (MULTIVITAMIN) capsule Take 1 capsule by mouth daily.   mupirocin ointment (BACTROBAN) 2 % Apply to open areas/sores twice daily until healed.   nitrofurantoin (MACRODANTIN) 100 MG capsule Take 1 capsule (100 mg total) by mouth daily.   omega-3 acid ethyl esters (LOVAZA) 1 g capsule Take by mouth.   pantoprazole (PROTONIX) 40 MG tablet Take 1 tablet (40 mg total) by mouth daily.   pimecrolimus (ELIDEL) 1 % cream Apply to affected areas rash in body folds and legs once to twice daily until improved.    polyethylene glycol powder (GLYCOLAX/MIRALAX) 17 GM/SCOOP powder TAKE 17 Gram BY MOUTH DAILY   potassium chloride (KLOR-CON M) 10 MEQ tablet Take 1 tablet (10 mEq total) by mouth 3 (three) times daily.   pregabalin (LYRICA) 100 MG capsule Take 1 capsule (100 mg total) by mouth 4 (four) times daily.   rizatriptan (MAXALT-MLT) 10 MG disintegrating tablet Take 10 mg by mouth as needed for migraine.   rOPINIRole (REQUIP) 2 MG tablet TAKE 1 TABLET BY MOUTH TWICE DAILY   Semaglutide, 2 MG/DOSE, 8 MG/3ML SOPN Inject 2 mg as directed once a week.   traZODone (DESYREL) 150 MG tablet Take 1 tablet (150 mg total) by mouth at bedtime.   valACYclovir (VALTREX) 1000 MG tablet Take 2 tablets by mouth at first onset of fever blisters, then take 2 tablets by mouth 12 hours later.   Vibegron (GEMTESA) 75 MG TABS Take 1 tablet (75 mg total) by mouth daily.   warfarin (COUMADIN) 5 MG tablet TAKE 1 TABLET BY MOUTH DAILY   [DISCONTINUED] linaclotide (LINZESS) 145 MCG CAPS capsule Take 1 capsule (145 mcg total) by mouth daily.   [DISCONTINUED] lisinopril (ZESTRIL) 20 MG tablet Take 1 tablet (20 mg total) by mouth daily.   enoxaparin (LOVENOX) 100 MG/ML injection Inject 1 mL (100 mg total) into the skin every 12 (twelve) hours for 10 days.   No facility-administered medications prior to visit.    Review of Systems    Objective    BP 116/67 (BP Location: Right Arm, Patient Position: Sitting, Cuff Size: Large)   Pulse (!) 58   Ht 5\' 10"  (1.778 m)   Wt 237 lb 4.8 oz (107.6 kg)   LMP  (LMP Unknown)   SpO2 98%   BMI 34.05 kg/m    Physical Exam Vitals reviewed.  Constitutional:      General: She is not in acute distress.    Appearance: Normal appearance. She is well-developed. She is not diaphoretic.  HENT:     Head: Normocephalic and atraumatic.  Eyes:     General: No scleral icterus.    Conjunctiva/sclera: Conjunctivae normal.  Neck:     Thyroid: No thyromegaly.  Cardiovascular:     Rate and  Rhythm: Normal rate and regular rhythm.     Heart sounds: Normal heart sounds. No murmur heard. Pulmonary:     Effort: Pulmonary effort is normal. No respiratory distress.     Breath sounds: Normal breath sounds. No wheezing, rhonchi or rales.  Musculoskeletal:     Cervical back: Neck supple.     Right lower leg: No edema.     Left lower leg: No edema.  Lymphadenopathy:     Cervical: No cervical adenopathy.  Skin:    General: Skin is warm and dry.     Findings: No rash.  Neurological:  Mental Status: She is alert and oriented to person, place, and time. Mental status is at baseline.  Psychiatric:        Mood and Affect: Mood normal.        Behavior: Behavior normal.    Bruising of lower abd wall   No results found for any visits on 12/02/23.  Assessment & Plan    Routine Health Maintenance and Physical Exam  Exercise Activities and Dietary recommendations  Goals   None     Immunization History  Administered Date(s) Administered   PNEUMOCOCCAL CONJUGATE-20 10/22/2021   Pneumococcal Polysaccharide-23 03/07/2020   Tdap 03/07/2020    Health Maintenance  Topic Date Due   COVID-19 Vaccine (1) Never done   OPHTHALMOLOGY EXAM  Never done   Zoster Vaccines- Shingrix (1 of 2) Never done   Diabetic kidney evaluation - Urine ACR  11/08/2023   HEMOGLOBIN [name]  11/09/2023   INFLUENZA VACCINE  02/02/2024 (Originally 06/05/2023)   Diabetic kidney evaluation - eGFR measurement  09/10/2024   MAMMOGRAM  11/26/2024   FOOT EXAM  12/01/2024   Colonoscopy  12/10/2027   DTaP/Tdap/Td (2 - Td or Tdap) 03/07/2030   Pneumococcal Vaccine 73-20 Years old  Completed   Hepatitis C Screening  Completed   HIV Screening  Completed   HPV VACCINES  Aged Out    Discussed health benefits of physical activity, and encouraged her to engage in regular exercise appropriate for her age and condition.  Problem List Items Addressed This Visit       Cardiovascular and Mediastinum   Hypertension  associated with diabetes (HCC) (Chronic)   Hypertension managed with lisinopril 20 mg daily. Reports episodes of hypotension in the afternoon, leading to near syncope. Discussed risks of current dose and benefits of reducing to 10 mg daily to prevent hypotension. - Reduce lisinopril dose to 10 mg daily - Monitor blood pressure and report any further episodes of hypotension      Relevant Medications   lisinopril (ZESTRIL) 10 MG tablet   Other Relevant Orders   Hemoglobin [name]   Comprehensive metabolic panel     Digestive   Drug-induced constipation   Chronic constipation likely exacerbated by semaglutide (Ozempic). Reports severe constipation with bowel movements every 8-10 days despite Linzess, Miralax, and enemas. Symptoms include abdominal bloating, fatigue, and discomfort. Suspected that Ozempic is contributing to the severity. Discussed risks of multiple Linzess doses, potential side effects, and need for a clean out. Increasing Linzess to 290 mcg daily and performing a Miralax clean out. Potential need to lower Ozempic dose or switch medication discussed with Dr. Marquis Lunch. - Increase Linzess to 290 mcg daily - Perform a Miralax clean out using one 238g bottle mixed with at least 32 ounces of liquid - Discuss with Dr. 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)   Chronic and stable Recheck metabolic panel Avoid nephrotoxic meds       Relevant Medications   lisinopril (ZESTRIL) 10 MG tablet   Other Relevant Orders   Microalbumin / creatinine urine ratio   Diabetic peripheral neuropathy (HCC) (Chronic)   Chronic and stable No med changes      Relevant Medications   lisinopril (ZESTRIL) 10 MG tablet   Other Relevant Orders   Microalbumin / creatinine urine ratio  Hypothyroidism   Previously well  controlled Continue Synthroid at current dose  Recheck TSH and adjust Synthroid as indicated        Relevant Orders   TSH   Type 2 diabetes mellitus with stage 3 chronic kidney disease, without long-term current use of insulin (HCC)   Diabetes management includes regular monitoring and medication adjustments. Due for routine diabetic screenings. Discussed importance of regular [name], kidney and liver function tests, cholesterol panel, and annual diabetic urine test. Emphasized need for a diabetic eye exam including a retina scan. - Order [name], kidney and liver function tests, and cholesterol panel - Ensure annual diabetic urine test is completed - Schedule diabetic eye exam including retina scan      Relevant Medications   lisinopril (ZESTRIL) 10 MG tablet   Other Relevant Orders   Hemoglobin [name]   Comprehensive metabolic panel   Microalbumin / creatinine urine ratio   Hyperlipidemia associated with type 2 diabetes mellitus (HCC)   Previously well controlled Continue statin Repeat FLP and CMP Goal LDL < 70      Relevant Medications   lisinopril (ZESTRIL) 10 MG tablet   Other Relevant Orders   Hemoglobin [name]   Lipid panel   Other Visit Diagnoses       Encounter for annual physical exam    -  Primary          Anticoagulation Management On Lovenox as a bridge for Coumadin therapy due to an upcoming procedure. Reports significant bruising and discomfort at injection sites. Discussed rotating injection sites to minimize bruising and using a heating pad to help resorb bruising. Explained that Lovenox is known to cause bruising and discomfort but is necessary until INR is stable post-procedure. - Rotate injection sites frequently - Use a heating pad to help resorb bruising - Continue Lovenox until INR is stable post-procedure - Follow up with INR check on February 5th  General Health Maintenance Routine health maintenance and screenings are up to date except for a few items.  Discussed importance of scheduling an eye exam with a focus on diabetic retinopathy and considering shingles vaccination. Emphasized need for regular foot exams and follow up with Dr. Lourdes Sledge every three months. - Schedule an eye exam with a focus on diabetic retinopathy - Consider shingles vaccination and check insurance coverage - Continue with regular foot exams and follow up with Dr. Lourdes Sledge every three months - Schedule next wellness visit in six months  Follow-up - Follow up with Dr. Marquis Lunch at the end of the month regarding Ozempic - Schedule next INR check on February 5th - Schedule next wellness visit in six m onths.       Return in about 6 months (around 05/31/2024) for chronic disease f/u.     Shirlee Latch, MD  [facility] [phone] (phone) [phone] (fax)  [facility]

## 2023-12-02 NOTE — Assessment & Plan Note (Signed)
Hypertension managed with lisinopril 20 mg daily. Reports episodes of hypotension in the afternoon, leading to near syncope. Discussed risks of current dose and benefits of reducing to 10 mg daily to prevent hypotension. - Reduce lisinopril dose to 10 mg daily - Monitor blood pressure and report any further episodes of hypotension

## 2023-12-02 NOTE — Assessment & Plan Note (Signed)
Chronic constipation likely exacerbated by semaglutide (Ozempic). Reports severe constipation with bowel movements every 8-10 days despite Linzess, Miralax, and enemas. Symptoms include abdominal bloating, fatigue, and discomfort. Suspected that Ozempic is contributing to the severity. Discussed risks of multiple Linzess doses, potential side effects, and need for a clean out. Increasing Linzess to 290 mcg daily and performing a Miralax clean out. Potential need to lower Ozempic dose or switch medication discussed with Dr. Marquis Lunch. - Increase Linzess to 290 mcg daily - Perform a Miralax clean out using one 238g bottle mixed with at least 32 ounces of liquid - Discuss with Dr. Marquis Lunch about potentially lowering the dose of Ozempic or switching to an alternative medication - Ensure adequate fiber intake from fruits and vegetables

## 2023-12-02 NOTE — Patient Instructions (Addendum)
The CDC recommends two doses of Shingrix (the shingles vaccine) separated by 2 to 6 months for adults age 58 years and older. I recommend checking with your insurance plan regarding coverage for this vaccine.    Make diabetic eye exam

## 2023-12-02 NOTE — Assessment & Plan Note (Signed)
Diabetes management includes regular monitoring and medication adjustments. Due for routine diabetic screenings. Discussed importance of regular [name], kidney and liver function tests, cholesterol panel, and annual diabetic urine test. Emphasized need for a diabetic eye exam including a retina scan. - Order [name], kidney and liver function tests, and cholesterol panel - Ensure annual diabetic urine test is completed - Schedule diabetic eye exam including retina scan

## 2023-12-02 NOTE — Addendum Note (Signed)
Addended by: Erasmo Downer on: 12/02/2023 11:42 AM   Modules accepted: Level of Service

## 2023-12-02 NOTE — Assessment & Plan Note (Signed)
Chronic and stable Recheck metabolic panel Avoid nephrotoxic meds

## 2023-12-02 NOTE — Assessment & Plan Note (Signed)
Chronic and stable No med changes

## 2023-12-02 NOTE — Assessment & Plan Note (Signed)
Previously well controlled Continue statin Repeat FLP and CMP Goal LDL < 70

## 2023-12-02 NOTE — Assessment & Plan Note (Signed)
Previously well controlled Continue Synthroid at current dose  Recheck TSH and adjust Synthroid as indicated

## 2023-12-03 LAB — COMPREHENSIVE METABOLIC PANEL
ALT: 17 [IU]/L (ref 0–32)
AST: 15 [IU]/L (ref 0–40)
Albumin: 4.2 g/dL (ref 3.8–4.9)
Alkaline Phosphatase: 84 [IU]/L (ref 44–121)
BUN/Creatinine Ratio: 19 (ref 9–23)
BUN: 25 mg/dL — ABNORMAL HIGH (ref 6–24)
Bilirubin Total: 0.4 mg/dL (ref 0.0–1.2)
CO2: 25 mmol/L (ref 20–29)
Calcium: 9.6 mg/dL (ref 8.7–10.2)
Chloride: 103 mmol/L (ref 96–106)
Creatinine, Ser: 1.3 mg/dL — ABNORMAL HIGH (ref 0.57–1.00)
Globulin, Total: 2.5 g/dL (ref 1.5–4.5)
Glucose: 83 mg/dL (ref 70–99)
Potassium: 4.6 mmol/L (ref 3.5–5.2)
Sodium: 142 mmol/L (ref 134–144)
Total Protein: 6.7 g/dL (ref 6.0–8.5)
eGFR: 48 mL/min/{1.73_m2} — ABNORMAL LOW (ref >59)

## 2023-12-03 LAB — LIPID PANEL
Chol/HDL Ratio: 3.9 {ratio} (ref 0.0–4.4)
Cholesterol, Total: 167 mg/dL (ref 100–199)
HDL: 43 mg/dL (ref >39)
LDL Chol Calc (NIH): 96 mg/dL (ref 0–99)
Triglycerides: 160 mg/dL — ABNORMAL HIGH (ref 0–149)
VLDL Cholesterol Cal: 28 mg/dL (ref 5–40)

## 2023-12-03 LAB — HEMOGLOBIN A1C
Est. average glucose Bld gHb Est-mCnc: 114 mg/dL
Hgb A1c MFr Bld: 5.6 % (ref 4.8–5.6)

## 2023-12-03 LAB — MICROALBUMIN / CREATININE URINE RATIO
Creatinine, Urine: 62.4 mg/dL
Microalb/Creat Ratio: 21 mg/g{creat} (ref 0–29)
Microalbumin, Urine: 13.2 ug/mL

## 2023-12-03 LAB — TSH: TSH: 1.2 u[IU]/mL (ref 0.450–4.500)

## 2023-12-04 ENCOUNTER — Encounter: Payer: Self-pay | Admitting: Family Medicine

## 2023-12-08 LAB — HM MAMMOGRAPHY

## 2023-12-09 ENCOUNTER — Ambulatory Visit
Payer: MEDICAID | Attending: Student in an Organized Health Care Education/Training Program | Admitting: Student in an Organized Health Care Education/Training Program

## 2023-12-09 ENCOUNTER — Encounter: Payer: Self-pay | Admitting: Student in an Organized Health Care Education/Training Program

## 2023-12-09 VITALS — BP 111/63 | HR 72 | Temp 96.8°F | Resp 16 | Ht 70.0 in | Wt 225.0 lb

## 2023-12-09 DIAGNOSIS — Z0289 Encounter for other administrative examinations: Secondary | ICD-10-CM | POA: Diagnosis present

## 2023-12-09 DIAGNOSIS — M792 Neuralgia and neuritis, unspecified: Secondary | ICD-10-CM

## 2023-12-09 DIAGNOSIS — M170 Bilateral primary osteoarthritis of knee: Secondary | ICD-10-CM | POA: Diagnosis present

## 2023-12-09 DIAGNOSIS — M546 Pain in thoracic spine: Secondary | ICD-10-CM

## 2023-12-09 DIAGNOSIS — G8929 Other chronic pain: Secondary | ICD-10-CM

## 2023-12-09 DIAGNOSIS — M25561 Pain in right knee: Secondary | ICD-10-CM | POA: Diagnosis not present

## 2023-12-09 DIAGNOSIS — E1142 Type 2 diabetes mellitus with diabetic polyneuropathy: Secondary | ICD-10-CM

## 2023-12-09 DIAGNOSIS — M25562 Pain in left knee: Secondary | ICD-10-CM

## 2023-12-09 DIAGNOSIS — G894 Chronic pain syndrome: Secondary | ICD-10-CM | POA: Diagnosis present

## 2023-12-09 DIAGNOSIS — M7918 Myalgia, other site: Secondary | ICD-10-CM

## 2023-12-09 DIAGNOSIS — M961 Postlaminectomy syndrome, not elsewhere classified: Secondary | ICD-10-CM | POA: Diagnosis present

## 2023-12-09 MED ORDER — HYDROCODONE-ACETAMINOPHEN 10-325 MG PO TABS: 1.0000 | ORAL_TABLET | Freq: Four times a day (QID) | ORAL | 0 refills | Status: DC | PRN

## 2023-12-09 MED ORDER — HYDROCODONE-ACETAMINOPHEN 10-325 MG PO TABS: 1.0000 | ORAL_TABLET | Freq: Four times a day (QID) | ORAL | 0 refills | Status: AC | PRN

## 2023-12-09 NOTE — Progress Notes (Signed)
PROVIDER NOTE: Information contained herein reflects review and annotations entered in association with encounter. Interpretation of such information and data should be left to medically-trained personnel. Information provided to patient can be located elsewhere in the medical record under "Patient Instructions". Document created using STT-dictation technology, any transcriptional errors that may result from process are unintentional.    Patient: Jane [name]  Service Category: E/M  Provider: Edward Jolly, MD  DOB: 04/16/1966  DOS: 12/09/2023  Referring Provider: Erasmo Downer, MD  MRN: [id number]  Specialty: Interventional Pain Management  PCP: Erasmo Downer, MD  Type: Established Patient  Setting: Ambulatory outpatient    Location: Office  Delivery: Face-to-face     HPI  Jane [name], a 58 y.o. year old female, is here today because of her Bilateral primary osteoarthritis of knee [M17.0]. [name] primary complain today is Knee Pain (Bilateral )  Pertinent problems: 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 Pain Assessment: Severity of Chronic pain is reported as a 8 /10. Location: Knee (lumbar left is worse) Left,  Right/lumbar pain down left leg.. Onset: More than a month ago. Quality: Discomfort, Sore, Constant, Other (Comment) (swollen). Timing: Constant. Modifying factor(s): ice/heat, rest, medications. Vitals:  height is 5\' 10"  (1.778 m) and weight is 225 lb (102.1 kg). Her temporal temperature is 96.8 F (36 C) (abnormal). Her blood pressure is 111/63 and her pulse is 72. Her respiration is 16 and oxygen saturation is 95%.  BMI: Estimated body mass index is 32.28 kg/m as calculated from the following:   Height as of this encounter: 5\' 10"  (1.778 m).   Weight as of this encounter: 225 lb (102.1 kg). Last encounter: 09/11/2023. Last procedure: 11/17/2023.  Reason for encounter:   History of Present Illness   Jane [name] is a 58 year old female who presents for follow-up of knee pain management.  She has been experiencing knee pain, with the right knee showing improvement following Monovisc gel injections. The left knee continues to experience discomfort.   She is undergoing Qutenza treatments, with the last session completed in December. She anticipates the next session to be scheduled after March 9th.  Her current medication regimen includes hydrocodone, with the next refill scheduled for February 18th.       Pharmacotherapy Assessment  Analgesic: Hydrocodone 10 mg 4 times daily as needed, quantity 120/month; MME equals 40   Monitoring: [place] PMP: PDMP reviewed during this encounter.       Pharmacotherapy: No side-effects or adverse reactions reported. Compliance: No problems identified. Effectiveness: Clinically acceptable.  [name], RN  12/09/2023  9:53 AM  Sign when Signing Visit Nursing Pain Medication Assessment:  Safety precautions to be maintained throughout the outpatient stay will include: orient to surroundings, keep bed in low position, maintain call bell within reach at all times, provide assistance with transfer out of bed and ambulation.  Medication Inspection  Compliance: Pill count conducted under aseptic conditions,  in front of the patient. Neither the pills nor the bottle was removed from the patient's sight at any time. Once count was completed pills were immediately returned to the patient in their original bottle.  Medication: Hydrocodone/APAP Pill/Patch Count:  52 of 120 pills remain Pill/Patch Appearance: Markings consistent with prescribed medication Bottle Appearance: Standard pharmacy container. Clearly labeled. Filled Date: 01 / 19 / 2025 Last Medication intake:  Yesterday    No results found for: "CBDTHCR" No results found for: "D8THCCBX" No results found for: "D9THCCBX"  UDS:  Summary  Date Value Ref Range Status  06/17/2023 Note  Final    Comment:    ==================================================================== [employer] Select 13 (MW) ==================================================================== Test                             Result       Flag       Units  Drug Present and Declared for Prescription Verification   Amphetamine                    2908         EXPECTED   ng/mg creat    Amphetamine is available as a schedule II prescription drug.    Hydrocodone                    1339         EXPECTED   ng/mg creat   Dihydrocodeine                 221          EXPECTED   ng/mg creat   Norhydrocodone                 697          EXPECTED   ng/mg creat    Sources of hydrocodone include scheduled prescription medications.    Dihydrocodeine and norhydrocodone are expected metabolites of    hydrocodone. Dihydrocodeine is also available as a scheduled    prescription medication.  Drug Absent but Declared for Prescription Verification   Alprazolam                     Not Detected UNEXPECTED ng/mg creat ==================================================================== Test                      Result    Flag   Units      Ref Range   Creatinine              61               mg/dL       >=16 ==================================================================== Declared Medications:  The flagging and interpretation on this report are based on the  following declared medications.  Unexpected results may arise from  inaccuracies in the declared medications.   **Note: The testing scope of this panel includes these medications:   Alprazolam (Xanax)  Amphetamine (Adderall)  Hydrocodone (Norco)   **Note: The testing scope of this panel does not include the  following reported medications:   Acetaminophen (Norco)  Aspirin  Atorvastatin (Lipitor)  Cetirizine (Zyrtec)  Estradiol (Estrace)  Famotidine (Pepcid)  Fluoxetine (Prozac)  Ketoconazole (Nizoral)  Levothyroxine (Synthroid)  Linaclotide (Linzess)  Methocarbamol (Robaxin)  Multivitamin  Omega-3 Fatty Acids  Pantoprazole (Protonix)  Polyethylene Glycol (MiraLAX)  Potassium (Klor-Con)  Pregabalin (Lyrica)  Ropinirole (Requip)  Semaglutide  Supplement  Trazodone (Desyrel)  Valacyclovir (Valtrex)  Vibegron (Gemtesa)  Warfarin (Coumadin) ==================================================================== For clinical consultation, please call [phone]. ====================================================================       ROS  Constitutional: Denies any fever or chills Gastrointestinal: No reported hemesis, hematochezia, vomiting, or acute GI distress Musculoskeletal:  Bilateral knee pain Neurological:  Paresthesias bilateral feet  Medication Review  ALPRAZolam, FLUoxetine, Fremanezumab-vfrm, Garlic, HYDROcodone-acetaminophen, Semaglutide (2 MG/DOSE), Vibegron, amphetamine-dextroamphetamine, aspirin EC, atorvastatin, cetirizine, chlorhexidine, cromolyn, cycloSPORINE, enoxaparin, estradiol, famotidine, ketoconazole, levothyroxine, linaclotide, lisinopril, methocarbamol, multivitamin, mupirocin ointment, nitrofurantoin, omega-3 acid ethyl esters, pantoprazole, pimecrolimus, polyethylene glycol  powder, potassium chloride, pregabalin, rOPINIRole, rizatriptan, traZODone, valACYclovir, and warfarin  History Review  Allergy: Ms. Steppe is allergic to aripiprazole, seroquel [quetiapine fumarate], chlorpromazine, gabapentin, prednisone, and quetiapine. Drug: Ms. Uffelman  reports no history of drug use. Alcohol:  reports no history of alcohol use. Tobacco:  reports that she has never smoked. She has never used smokeless tobacco. Social: Ms. Walters  reports that she has never smoked. She has never used smokeless tobacco. She reports that she does not drink alcohol and does not use drugs. Medical:  has a past medical history of ADD (attention deficit disorder), Allergy, Anxiety, Arthritis (2021), Back pain, Bilateral swelling of feet, Bipolar 1 disorder (HCC), Bipolar 1 disorder (HCC), Bipolar disorder (HCC), Blood transfusion without reported diagnosis (2020), Chewing difficulty, Chronic fatigue syndrome, Chronic kidney disease, Clotting disorder (HCC), Constipation, Depression (2000), Diabetes mellitus without complication (HCC), Dyspnea, GERD (gastroesophageal reflux disease), Headache, High cholesterol, History of blood clots, History of DVT (deep vein thrombosis), Hypertension, Hypothyroidism, Joint pain, Neuromuscular disorder (HCC), Neuropathy, Obsessive-compulsive disorder, Peripheral vascular disease (HCC), Prediabetes, Respiratory failure requiring intubation (HCC), Restless leg syndrome, Shortness of breath, Sleep apnea, Thrombocytopenia (HCC) (09/15/2019), Trigger thumb of left hand (01/2018), and Trigger thumb of right hand. Surgical: Ms. Nanez  has a past surgical history that includes Cholecystectomy; Trigger finger release (Right, 12/01/2017); Abdominal hysterectomy (06/2016); Lumbar fusion (11/21/2000); Lumbar spine surgery; Trigger finger release (Left, 01/26/2018); LOWER EXTREMITY VENOGRAPHY (N/A, 08/17/2018); Femoral-femoral Bypass Graft (Left, 11/24/2018); AV fistula  placement (Left, 11/24/2018); LOWER EXTREMITY VENOGRAPHY (Bilateral, 03/09/2019); Patch angioplasty (Left, 03/30/2019); Ultrasound guidance for vascular access (Right, 03/30/2019); Femoral artery debridement (03/30/2019); Insertion of iliac stent (03/30/2019); Groin debridement (Left, 04/27/2019); Application if wound vac (Left, 04/27/2019); LOWER EXTREMITY VENOGRAPHY (Left, 08/16/2019); PERIPHERAL VASCULAR INTERVENTION (Left, 08/16/2019); Back surgery; Colonoscopy with propofol (N/A, 11/22/2019); Knee arthroscopy with meniscal repair (Left, 11/14/2020); LOWER EXTREMITY VENOGRAPHY (Left, 03/25/2022); PERIPHERAL VASCULAR THROMBECTOMY (Left, 03/25/2022); PERIPHERAL VASCULAR INTERVENTION (Left, 03/25/2022); Colonoscopy with propofol (N/A, 12/09/2022); and Spine surgery. Family: family history includes ADD / ADHD in her daughter; Anxiety disorder in her daughter; Bipolar disorder in her daughter, maternal aunt, and mother; Depression in her mother; Diabetes in her father; Drug abuse in her daughter and daughter; Early death in her brother; Heart disease in her brother, father, and mother; Hyperlipidemia in her brother, father, and mother; Hypertension in her brother, father, mother, and sister; Kidney disease in her brother; Obesity in her father and mother; Sleep apnea in her father and mother; Stroke in her mother; Suicidality in her maternal aunt.  Laboratory Chemistry Profile   Renal Lab Results  Component Value Date   BUN 25 (H) 12/02/2023   CREATININE 1.30 (H) 12/02/2023   BCR 19 12/02/2023   GFRAA 53 (L) 07/18/2020   GFRNONAA 46 (L) 09/11/2023    Hepatic Lab Results  Component Value Date   AST 15 12/02/2023   ALT 17 12/02/2023   ALBUMIN 4.2 12/02/2023   ALKPHOS  84 12/02/2023   HCVAB NON REACTIVE 11/06/2021    Electrolytes Lab Results  Component Value Date   NA 142 12/02/2023   K 4.6 12/02/2023   CL 103 12/02/2023   CALCIUM 9.6 12/02/2023   MG 2.1 08/06/2022   PHOS 4.0 04/04/2019     Bone Lab Results  Component Value Date   VD25OH 48.8 03/14/2023    Inflammation (CRP: Acute Phase) (ESR: Chronic Phase) Lab Results  Component Value Date   CRP 11 (H) 02/16/2020   ESRSEDRATE 33 02/16/2020   LATICACIDVEN 1.2 01/05/2020         Note: Above Lab results reviewed.  Recent Imaging Review  CT KNEE LEFT WO CONTRAST CLINICAL DATA:  Left knee pain since a fall in August, 2024.  EXAM: CT OF THE LEFT KNEE WITHOUT CONTRAST  TECHNIQUE: Multidetector CT imaging of the left knee was performed according to the standard protocol. Multiplanar CT image reconstructions were also generated.  RADIATION DOSE REDUCTION: This exam was performed according to the departmental dose-optimization program which includes automated exposure control, adjustment of the mA and/or kV according to patient size and/or use of iterative reconstruction technique.  COMPARISON:  Plain films left knee 03/26/2023.  FINDINGS: Bones/Joint/Cartilage  There is no acute bony or joint abnormality. A very small joint effusion is present. The patient has degenerative disease about the knee with joint space narrowing and osteophytosis worst along the medial joint line. A 0.5 cm in diameter loose body is seen superior to the medial tibial eminence. A second, larger loose body is superior to and osteophyte along the posterior aspect of the medial tibia no chondrocalcinosis.  Ligaments  Suboptimally assessed by CT.  Muscles and Tendons  Intact and normal in appearance.  Soft tissues  Negative.  IMPRESSION: 1. No acute abnormality. 2. Moderate appearing osteoarthritis about the knee is worst about the medial compartment.  Electronically Signed   By: Drusilla Kanner M.D.   On: 09/01/2023 09:29 Note: Reviewed        Physical Exam  General appearance: Well nourished, well developed, and well hydrated. In no apparent acute distress Mental status: Alert, oriented x 3 (person, place, & time)        Respiratory: No evidence of acute respiratory distress Eyes: PERLA Vitals: BP 111/63 (BP Location: Right Arm, Patient Position: Sitting, Cuff Size: Normal)   Pulse 72   Temp (!) 96.8 F (36 C) (Temporal)   Resp 16   Ht 5\' 10"  (1.778 m)   Wt 225 lb (102.1 kg)   LMP  (LMP Unknown)   SpO2 95%   BMI 32.28 kg/m  BMI: Estimated body mass index is 32.28 kg/m as calculated from the following:   Height as of this encounter: 5\' 10"  (1.778 m).   Weight as of this encounter: 225 lb (102.1 kg). Ideal: Ideal body weight: 68.5 kg (151 lb 0.2 oz) Adjusted ideal body weight: 81.9 kg (180 lb 9.7 oz)  Bilateral knee pain, worse with weightbearing.  Knee arthralgia  Paresthesias bilateral feet  Assessment   Diagnosis  1. Bilateral primary osteoarthritis of knee   2. Diabetic peripheral neuropathy (HCC)   3. Bilateral chronic knee pain   4. Chronic bilateral thoracic back pain   5. Failed back surgical syndrome   6. Neurogenic pain   7. Pain management contract signed   8. Chronic pain syndrome   9. Chronic musculoskeletal pain      Updated Problems: No problems updated.  Plan of Care  Problem-specific:  Assessment  and Plan    Chronic Knee Pain Chronic left knee pain persists, but there is improvement in the right knee following Monovisc injections. Monovisc is beneficial, and monthly injections will continue. The next injection is scheduled for March. Hydrocodone will be refilled on February 18th.  Neuropathic Pain Qutenza treatments occur every three months, with the last session in December. The next treatment is due after March 9th. Qutenza is helpful, and treatments will be coordinated with Monovisc injections on the same day.  Follow-up A follow-up appointment is scheduled in one month.      Ms. Latrisha Coiro has a current medication list which includes the following long-term medication(s): amphetamine-dextroamphetamine, amphetamine-dextroamphetamine,  amphetamine-dextroamphetamine, amphetamine-dextroamphetamine, atorvastatin, cetirizine, fluoxetine, levothyroxine, linaclotide, lisinopril, pantoprazole, potassium chloride, pregabalin, rizatriptan, ropinirole, trazodone, warfarin, and enoxaparin.  Pharmacotherapy (Medications Ordered): Meds ordered this encounter  Medications   HYDROcodone-acetaminophen (NORCO) 10-325 MG tablet    Sig: Take 1 tablet by mouth every 6 (six) hours as needed. To last 30 days from fill date    Dispense:  120 tablet    Refill:  0   HYDROcodone-acetaminophen (NORCO) 10-325 MG tablet    Sig: Take 1 tablet by mouth every 6 (six) hours as needed. To last 30 days from fill date    Dispense:  120 tablet    Refill:  0   HYDROcodone-acetaminophen (NORCO) 10-325 MG tablet    Sig: Take 1 tablet by mouth every 6 (six) hours as needed. To last 30 days from fill date    Dispense:  120 tablet    Refill:  0   Orders:  Orders Placed This Encounter  Procedures   NEUROLYSIS    Please order Qutenza patches from pharmacy    Standing Status:   Future    Expected Date:   01/08/2024    Expiration Date:   03/07/2024    Where will this procedure be performed?:   [facility]   KNEE INJECTION    Local Anesthetic & Steroid injection.    Standing Status:   Future    Expected Date:   01/08/2024    Expiration Date:   03/07/2024    Scheduling Instructions:     Bilateral monovisc    Where will this procedure be performed?:   [facility]   Follow-up plan:   Return in about 5 weeks (around 01/15/2024) for Qutenza and B/L knee gel injection.      Recent Visits Date Type Provider Dept  11/17/23 Procedure visit Edward Jolly, MD [facility]  10/13/23 Procedure visit Edward Jolly, MD [facility]  09/29/23 Procedure visit Edward Jolly, MD [facility]  09/11/23 Office Visit Edward Jolly, MD [facility]  Showing recent visits within past 90 days and meeting all other  requirements Today's Visits Date Type Provider Dept  12/09/23 Office Visit Edward Jolly, MD [facility]  Showing today's visits and meeting all other requirements Future Appointments Date Type Provider Dept  01/14/24 Appointment Edward Jolly, MD [facility]  Showing future appointments within next 90 days and meeting all other requirements  I discussed the assessment and treatment plan with the patient. The patient was provided an opportunity to ask questions and all were answered. The patient agreed with the plan and demonstrated an understanding of the instructions.  Patient advised to call back or seek an in-person evaluation if the symptoms or condition worsens.  Duration of encounter: .  Total time on encounter, as per AMA guidelines included both the  face-to-face and non-face-to-face time personally spent by the physician and/or other qualified health care professional(s) on the day of the encounter (includes time in activities that require the physician or other qualified health care professional and does not include time in activities normally performed by clinical staff). Physician's time may include the following activities when performed: Preparing to see the patient (e.g., pre-charting review of records, searching for previously ordered imaging, lab work, and nerve conduction tests) Review of prior analgesic pharmacotherapies. Reviewing PMP Interpreting ordered tests (e.g., lab work, imaging, nerve conduction tests) Performing post-procedure evaluations, including interpretation of diagnostic procedures Obtaining and/or reviewing separately obtained history Performing a medically appropriate examination and/or evaluation Counseling and educating the patient/family/caregiver Ordering medications, tests, or procedures Referring and communicating with other health care professionals (when not separately reported) Documenting clinical information in  the electronic or other health record Independently interpreting results (not separately reported) and communicating results to the patient/ family/caregiver Care coordination (not separately reported)  Note by: Edward Jolly, MD Date: 12/09/2023; Time: 11:50 AM

## 2023-12-09 NOTE — Patient Instructions (Signed)
GENERAL RISKS AND COMPLICATIONS  What are the risk, side effects and possible complications? Generally speaking, most procedures are safe.  However, with any procedure there are risks, side effects, and the possibility of complications.  The risks and complications are dependent upon the sites that are lesioned, or the type of nerve block to be performed.  The closer the procedure is to the spine, the more serious the risks are.  Great care is taken when placing the radio frequency needles, block needles or lesioning probes, but sometimes complications can occur. Infection: Any time there is an injection through the skin, there is a risk of infection.  This is why sterile conditions are used for these blocks.  There are four possible types of infection. Localized skin infection. Central Nervous System Infection-This can be in the form of Meningitis, which can be deadly. Epidural Infections-This can be in the form of an epidural abscess, which can cause pressure inside of the spine, causing compression of the spinal cord with subsequent paralysis. This would require an emergency surgery to decompress, and there are no guarantees that the patient would recover from the paralysis. Discitis-This is an infection of the intervertebral discs.  It occurs in about 1% of discography procedures.  It is difficult to treat and it may lead to surgery.        2. Pain: the needles have to go through skin and soft tissues, will cause soreness.       3. Damage to internal structures:  The nerves to be lesioned may be near blood vessels or    other nerves which can be potentially damaged.       4. Bleeding: Bleeding is more common if the patient is taking blood thinners such as  aspirin, Coumadin, Ticiid, Plavix, etc., or if he/she have some genetic predisposition  such as hemophilia. Bleeding into the spinal canal can cause compression of the spinal  cord with subsequent paralysis.  This would require an emergency  surgery to  decompress and there are no guarantees that the patient would recover from the  paralysis.       5. Pneumothorax:  Puncturing of a lung is a possibility, every time a needle is introduced in  the area of the chest or upper back.  Pneumothorax refers to free air around the  collapsed lung(s), inside of the thoracic cavity (chest cavity).  Another two possible  complications related to a similar event would include: Hemothorax and Chylothorax.   These are variations of the Pneumothorax, where instead of air around the collapsed  lung(s), you may have blood or chyle, respectively.       6. Spinal headaches: They may occur with any procedures in the area of the spine.       7. Persistent CSF (Cerebro-Spinal Fluid) leakage: This is a rare problem, but may occur  with prolonged intrathecal or epidural catheters either due to the formation of a fistulous  track or a dural tear.       8. Nerve damage: By working so close to the spinal cord, there is always a possibility of  nerve damage, which could be as serious as a permanent spinal cord injury with  paralysis.       9. Death:  Although rare, severe deadly allergic reactions known as "Anaphylactic  reaction" can occur to any of the medications used.      10. Worsening of the symptoms:  We can always make thing worse.  What are the chances  of something like this happening? Chances of any of this occuring are extremely low.  By statistics, you have more of a chance of getting killed in a motor vehicle accident: while driving to the hospital than any of the above occurring .  Nevertheless, you should be aware that they are possibilities.  In general, it is similar to taking a shower.  Everybody knows that you can slip, hit your head and get killed.  Does that mean that you should not shower again?  Nevertheless always keep in mind that statistics do not mean anything if you happen to be on the wrong side of them.  Even if a procedure has a 1 (one) in a  1,000,000 (million) chance of going wrong, it you happen to be that one..Also, keep in mind that by statistics, you have more of a chance of having something go wrong when taking medications.  Who should not have this procedure? If you are on a blood thinning medication (e.g. Coumadin, Plavix, see list of "Blood Thinners"), or if you have an active infection going on, you should not have the procedure.  If you are taking any blood thinners, please inform your physician.  How should I prepare for this procedure? Do not eat or drink anything at least six hours prior to the procedure. Bring a driver with you .  It cannot be a taxi. Come accompanied by an adult that can drive you back, and that is strong enough to help you if your legs get weak or numb from the local anesthetic. Take all of your medicines the morning of the procedure with just enough water to swallow them. If you have diabetes, make sure that you are scheduled to have your procedure done first thing in the morning, whenever possible. If you have diabetes, take only half of your insulin dose and notify our nurse that you have done so as soon as you arrive at the clinic. If you are diabetic, but only take blood sugar pills (oral hypoglycemic), then do not take them on the morning of your procedure.  You may take them after you have had the procedure. Do not take aspirin or any aspirin-containing medications, at least eleven (11) days prior to the procedure.  They may prolong bleeding. Wear loose fitting clothing that may be easy to take off and that you would not mind if it got stained with Betadine or blood. Do not wear any jewelry or perfume Remove any nail coloring.  It will interfere with some of our monitoring equipment.  NOTE: Remember that this is not meant to be interpreted as a complete list of all possible complications.  Unforeseen problems may occur.  BLOOD THINNERS The following drugs contain aspirin or other products,  which can cause increased bleeding during surgery and should not be taken for 2 weeks prior to and 1 week after surgery.  If you should need take something for relief of minor pain, you may take acetaminophen which is found in Tylenol,m Datril, Anacin-3 and Panadol. It is not blood thinner. The products listed below are.  Do not take any of the products listed below in addition to any listed on your instruction sheet.  A.P.C or A.P.C with Codeine Codeine Phosphate Capsules #3 Ibuprofen Ridaura  ABC compound Congesprin Imuran rimadil  Advil Cope Indocin Robaxisal  Alka-Seltzer Effervescent Pain Reliever and Antacid Coricidin or Coricidin-D  Indomethacin Rufen  Alka-Seltzer plus Cold Medicine Cosprin Ketoprofen S-A-C Tablets  Anacin Analgesic Tablets or Capsules Coumadin  Korlgesic Salflex  Anacin Extra Strength Analgesic tablets or capsules CP-2 Tablets Lanoril Salicylate  Anaprox Cuprimine Capsules Levenox Salocol  Anexsia-D Dalteparin Magan Salsalate  Anodynos Darvon compound Magnesium Salicylate Sine-off  Ansaid Dasin Capsules Magsal Sodium Salicylate  Anturane Depen Capsules Marnal Soma  APF Arthritis pain formula Dewitt's Pills Measurin Stanback  Argesic Dia-Gesic Meclofenamic Sulfinpyrazone  Arthritis Bayer Timed Release Aspirin Diclofenac Meclomen Sulindac  Arthritis pain formula Anacin Dicumarol Medipren Supac  Analgesic (Safety coated) Arthralgen Diffunasal Mefanamic Suprofen  Arthritis Strength Bufferin Dihydrocodeine Mepro Compound Suprol  Arthropan liquid Dopirydamole Methcarbomol with Aspirin Synalgos  ASA tablets/Enseals Disalcid Micrainin Tagament  Ascriptin Doan's Midol Talwin  Ascriptin A/D Dolene Mobidin Tanderil  Ascriptin Extra Strength Dolobid Moblgesic Ticlid  Ascriptin with Codeine Doloprin or Doloprin with Codeine Momentum Tolectin  Asperbuf Duoprin Mono-gesic Trendar  Aspergum Duradyne Motrin or Motrin IB Triminicin  Aspirin plain, buffered or enteric coated  Durasal Myochrisine Trigesic  Aspirin Suppositories Easprin Nalfon Trillsate  Aspirin with Codeine Ecotrin Regular or Extra Strength Naprosyn Uracel  Atromid-S Efficin Naproxen Ursinus  Auranofin Capsules Elmiron Neocylate Vanquish  Axotal Emagrin Norgesic Verin  Azathioprine Empirin or Empirin with Codeine Normiflo Vitamin E  Azolid Emprazil Nuprin Voltaren  Bayer Aspirin plain, buffered or children's or timed BC Tablets or powders Encaprin Orgaran Warfarin Sodium  Buff-a-Comp Enoxaparin Orudis Zorpin  Buff-a-Comp with Codeine Equegesic Os-Cal-Gesic   Buffaprin Excedrin plain, buffered or Extra Strength Oxalid   Bufferin Arthritis Strength Feldene Oxphenbutazone   Bufferin plain or Extra Strength Feldene Capsules Oxycodone with Aspirin   Bufferin with Codeine Fenoprofen Fenoprofen Pabalate or Pabalate-SF   Buffets II Flogesic Panagesic   Buffinol plain or Extra Strength Florinal or Florinal with Codeine Panwarfarin   Buf-Tabs Flurbiprofen Penicillamine   Butalbital Compound Four-way cold tablets Penicillin   Butazolidin Fragmin Pepto-Bismol   Carbenicillin Geminisyn Percodan   Carna Arthritis Reliever Geopen Persantine   Carprofen Gold's salt Persistin   Chloramphenicol Goody's Phenylbutazone   Chloromycetin Haltrain Piroxlcam   Clmetidine heparin Plaquenil   Cllnoril Hyco-pap Ponstel   Clofibrate Hydroxy chloroquine Propoxyphen         Before stopping any of these medications, be sure to consult the physician who ordered them.  Some, such as Coumadin (Warfarin) are ordered to prevent or treat serious conditions such as "deep thrombosis", "pumonary embolisms", and other heart problems.  The amount of time that you may need off of the medication may also vary with the medication and the reason for which you were taking it.  If you are taking any of these medications, please make sure you notify your pain physician before you undergo any procedures.         Sodium Hyaluronate  intra-articular injection What is this medication? SODIUM HYALURONATE (SOE dee um hye al yoor ON ate) is used to treat pain in the knee due to osteoarthritis. This medicine may be used for other purposes; ask your health care provider or pharmacist if you have questions. This medicine may be used for other purposes; ask your health care provider or pharmacist if you have questions. COMMON BRAND NAME(S): Amvisc, DUROLANE, Euflexxa, GELSYN-3, Hyalgan, Hymovis, Monovisc, Orthovisc, Supartz, Supartz FX, SynoJoynt, Triluron, TriVisc, VISCO What should I tell my care team before I take this medication? They need to know if you have any of these conditions: bleeding disorders glaucoma infection in the knee joint skin conditions or sensitivity skin infection an unusual allergic reaction to sodium hyaluronate, other medicines, foods, dyes, or preservatives. Different brands of sodium  hyaluronate contain different allergens. Some may contain egg. Talk to your doctor about your allergies to make sure that you get the right product. pregnant or trying to get pregnant breast-feeding How should I use this medication? This medicine is for injection into the knee joint. It is given by a health care professional in a hospital or clinic setting. Talk to your pediatrician regarding the use of this medicine in children. Special care may be needed. Overdosage: If you think you have taken too much of this medicine contact a poison control center or emergency room at once. NOTE: This medicine is only for you. Do not share this medicine with others. Overdosage: If you think you have taken too much of this medicine contact a poison control center or emergency room at once. NOTE: This medicine is only for you. Do not share this medicine with others. What if I miss a dose? This does not apply. What may interact with this medication? Interactions are not expected. This list may not describe all possible interactions.  Give your health care provider a list of all the medicines, herbs, non-prescription drugs, or dietary supplements you use. Also tell them if you smoke, drink alcohol, or use illegal drugs. Some items may interact with your medicine. This list may not describe all possible interactions. Give your health care provider a list of all the medicines, herbs, non-prescription drugs, or dietary supplements you use. Also tell them if you smoke, drink alcohol, or use illegal drugs. Some items may interact with your medicine. What should I watch for while using this medication? Tell your doctor or healthcare professional if your symptoms do not start to get better or if they get worse. If receiving this medicine for osteoarthritis, limit your activity after you receive your injection. Avoid physical activity for 48 hours following your injection to keep your knee from swelling. Do not stand on your feet for more than 1 hour at a time during the first 48 hours following your injection. Ask your doctor or healthcare professional about when you can begin major physical activity again. What side effects may I notice from receiving this medication? Side effects that you should report to your doctor or health care professional as soon as possible: allergic reactions like skin rash, itching or hives, swelling of the face, lips, or tongue dizziness facial flushing pain, tingling, numbness in the hands or feet vision changes if received this medicine during eye surgery Side effects that usually do not require medical attention (report to your doctor or health care professional if they continue or are bothersome): back pain bruising at site where injected chills diarrhea fever headache joint pain joint stiffness joint swelling muscle cramps muscle pain nausea, vomiting pain, redness, or irritation at site where injected weak or tired This list may not describe all possible side effects. Call your doctor for  medical advice about side effects. You may report side effects to FDA at 1-800-FDA-1088. This list may not describe all possible side effects. Call your doctor for medical advice about side effects. You may report side effects to FDA at 1-800-FDA-1088. Where should I keep my medication? This drug is given in a hospital or clinic and will not be stored at home. NOTE: This sheet is a summary. It may not cover all possible information. If you have questions about this medicine, talk to your doctor, pharmacist, or health care provider.  2024 Elsevier/Gold Standard (2015-11-23 00:00:00)Capsaicin Topical System What is this medication? CAPSAICIN (cap SAY sin) treats nerve  pain. It works by making your skin feel warm or cool, which blocks pain signals going to the brain. This medicine may be used for other purposes; ask your health care provider or pharmacist if you have questions. COMMON BRAND NAME(S): Qutenza What should I tell my care team before I take this medication? They need to know if you have any of these conditions: Have had a heart attack or stroke High blood pressure Large areas of burned or damaged skin An unusual or allergic reaction to capsaicin, hot peppers, other medications, foods, dyes, or preservatives Pregnant or trying to get pregnant Breastfeeding How should I use this medication? This medication is for external use only. It is applied by your care team in a hospital or clinic setting. Talk to your care team about the use of this medication in children. Special care may be needed. Overdosage: If you think you have taken too much of this medicine contact a poison control center or emergency room at once. NOTE: This medicine is only for you. Do not share this medicine with others. What if I miss a dose? This does not apply. What may interact with this medication? Interactions are not expected. Do not use any other skin products on the affected area without asking your care  team. This list may not describe all possible interactions. Give your health care provider a list of all the medicines, herbs, non-prescription drugs, or dietary supplements you use. Also tell them if you smoke, drink alcohol, or use illegal drugs. Some items may interact with your medicine. What should I watch for while using this medication? Your condition will be monitored carefully while you are receiving this medication. Tell your care team if your symptoms do not start to get better or if they get worse. Talk to your care team about how to treat discomfort. You may place a cooling pack from the refrigerator (not the freezer) on the area. Do not place it directly on the skin. Try not to touch the area where the patch was applied. If you do, wash your hands with soap and water right away. Your skin may be sensitive to heat for a few days after treatment. Avoid hot baths or showers, heating pads, and direct sunlight on the treated area. What side effects may I notice from receiving this medication? Side effects that you should report to your care team as soon as possible: Allergic reactions--skin rash, itching, hives, swelling of the face, lips, tongue, or throat Burning, itching, crusting, or peeling of treated skin Increase in blood pressure Numbness, decrease in sense of touch or sensation Side effects that usually do not require medical attention (report these to your care team if they continue or are bothersome): Mild skin irritation, redness, or dryness This list may not describe all possible side effects. Call your doctor for medical advice about side effects. You may report side effects to FDA at 1-800-FDA-1088. Where should I keep my medication? This medication is given in a hospital or clinic. It will not be stored at home. NOTE: This sheet is a summary. It may not cover all possible information. If you have questions about this medicine, talk to your doctor, pharmacist, or health care  provider.  2024 Elsevier/Gold Standard (2023-10-03 00:00:00)

## 2023-12-09 NOTE — Progress Notes (Signed)
Nursing Pain Medication Assessment:  Safety precautions to be maintained throughout the outpatient stay will include: orient to surroundings, keep bed in low position, maintain call bell within reach at all times, provide assistance with transfer out of bed and ambulation.  Medication Inspection Compliance: Pill count conducted under aseptic conditions, in front of the patient. Neither the pills nor the bottle was removed from the patient's sight at any time. Once count was completed pills were immediately returned to the patient in their original bottle.  Medication: Hydrocodone/APAP Pill/Patch Count:  52 of 120 pills remain Pill/Patch Appearance: Markings consistent with prescribed medication Bottle Appearance: Standard pharmacy container. Clearly labeled. Filled Date: 01 / 19 / 2025 Last Medication intake:  Yesterday

## 2023-12-10 ENCOUNTER — Other Ambulatory Visit: Payer: Self-pay | Admitting: Family Medicine

## 2023-12-10 ENCOUNTER — Ambulatory Visit (INDEPENDENT_AMBULATORY_CARE_PROVIDER_SITE_OTHER): Payer: MEDICAID

## 2023-12-10 ENCOUNTER — Encounter: Payer: Self-pay | Admitting: Family Medicine

## 2023-12-10 ENCOUNTER — Other Ambulatory Visit: Payer: Self-pay

## 2023-12-10 DIAGNOSIS — E1142 Type 2 diabetes mellitus with diabetic polyneuropathy: Secondary | ICD-10-CM

## 2023-12-10 DIAGNOSIS — M792 Neuralgia and neuritis, unspecified: Secondary | ICD-10-CM

## 2023-12-10 DIAGNOSIS — Z7901 Long term (current) use of anticoagulants: Secondary | ICD-10-CM | POA: Diagnosis not present

## 2023-12-10 LAB — POCT INR
INR: 1.3 — AB (ref 2.0–3.0)
PT: 15.8

## 2023-12-10 NOTE — Patient Instructions (Signed)
 Description   Patient to take 7.5 daily except 5 mg Sat and Sun.Return in 2 weeks.

## 2023-12-10 NOTE — Telephone Encounter (Signed)
 Patient requested refills. Patient reports she is going to run out since needing to Warfarin 7.5 mg daily except 5 mg Saturday and Sunday. She only had the 5mg  on current med list.I have pended the 7.5 mg. Don't know how many refills to send in or just fill the 5 mg.

## 2023-12-11 MED ORDER — WARFARIN SODIUM 5 MG PO TABS: ORAL_TABLET | ORAL | 11 refills | Status: DC

## 2023-12-19 ENCOUNTER — Telehealth: Payer: Self-pay | Admitting: *Deleted

## 2023-12-19 NOTE — Telephone Encounter (Signed)
Patient states her insurance is not covering Gemtesa no more. Patient would like to try something else. Please advise

## 2023-12-22 MED ORDER — OXYBUTYNIN CHLORIDE ER 10 MG PO TB24: 10.0000 mg | ORAL_TABLET | Freq: Every day | ORAL | 11 refills | Status: DC

## 2023-12-22 NOTE — Telephone Encounter (Signed)
Pt informed, meds sent.

## 2023-12-22 NOTE — Addendum Note (Signed)
Addended byRanda Lynn on: 12/22/2023 12:36 PM   Modules accepted: Orders

## 2023-12-24 ENCOUNTER — Ambulatory Visit: Payer: MEDICAID

## 2023-12-31 ENCOUNTER — Ambulatory Visit (INDEPENDENT_AMBULATORY_CARE_PROVIDER_SITE_OTHER): Payer: MEDICAID | Admitting: Family Medicine

## 2023-12-31 ENCOUNTER — Other Ambulatory Visit: Payer: Self-pay

## 2023-12-31 ENCOUNTER — Other Ambulatory Visit (HOSPITAL_COMMUNITY): Payer: Self-pay

## 2023-12-31 ENCOUNTER — Encounter: Payer: Self-pay | Admitting: Vascular Surgery

## 2023-12-31 ENCOUNTER — Telehealth: Payer: Self-pay

## 2023-12-31 ENCOUNTER — Ambulatory Visit (INDEPENDENT_AMBULATORY_CARE_PROVIDER_SITE_OTHER): Payer: MEDICAID

## 2023-12-31 VITALS — BP 144/71 | HR 55 | Temp 97.7°F | Ht 70.0 in | Wt 233.0 lb

## 2023-12-31 DIAGNOSIS — Z7901 Long term (current) use of anticoagulants: Secondary | ICD-10-CM | POA: Diagnosis not present

## 2023-12-31 DIAGNOSIS — E1159 Type 2 diabetes mellitus with other circulatory complications: Secondary | ICD-10-CM | POA: Diagnosis not present

## 2023-12-31 DIAGNOSIS — Z7985 Long-term (current) use of injectable non-insulin antidiabetic drugs: Secondary | ICD-10-CM

## 2023-12-31 DIAGNOSIS — E669 Obesity, unspecified: Secondary | ICD-10-CM

## 2023-12-31 DIAGNOSIS — E1165 Type 2 diabetes mellitus with hyperglycemia: Secondary | ICD-10-CM

## 2023-12-31 DIAGNOSIS — Z6833 Body mass index (BMI) 33.0-33.9, adult: Secondary | ICD-10-CM

## 2023-12-31 DIAGNOSIS — I152 Hypertension secondary to endocrine disorders: Secondary | ICD-10-CM

## 2023-12-31 DIAGNOSIS — I87002 Postthrombotic syndrome without complications of left lower extremity: Secondary | ICD-10-CM

## 2023-12-31 DIAGNOSIS — Z86718 Personal history of other venous thrombosis and embolism: Secondary | ICD-10-CM

## 2023-12-31 DIAGNOSIS — I871 Compression of vein: Secondary | ICD-10-CM

## 2023-12-31 LAB — POCT INR
INR: 2.9 (ref 2.0–3.0)
POC INR: 2.9
PT: 34.6

## 2023-12-31 MED ORDER — SEMAGLUTIDE (1 MG/DOSE) 4 MG/3ML ~~LOC~~ SOPN
1.0000 mg | PEN_INJECTOR | SUBCUTANEOUS | 0 refills | Status: DC
  Filled 2023-12-31: qty 3, 28d supply, fill #0

## 2023-12-31 NOTE — Assessment & Plan Note (Signed)
 Recent low INR of 1.3.  She had run out of medication.  Will get repeat INR today.

## 2023-12-31 NOTE — Patient Instructions (Signed)
 Description   Patient to take 7.5 daily except 5 mg Sat and Sun.Return in 2 weeks.

## 2023-12-31 NOTE — Telephone Encounter (Signed)
 Called pt after her MyChart message regarding LLE swelling. She stated pain/swelling has been worse x 3 weeks. MD is aware and pt has been scheduled for f/u with studies. Pt was offered multiple sooner dates but unable to take them due to her transportation. She will call us to r/s if she needs to or if anything changes/worsens.

## 2023-12-31 NOTE — Assessment & Plan Note (Signed)
 Experiencing significant constipation side effects of Ozempic.  Did miralax clean out and now taking linzess.  Will decrease ozempic 1mg  and see if the helps with her constipation.

## 2023-12-31 NOTE — Assessment & Plan Note (Signed)
 BP slightly elevated today- patient in a significant amount of pain currently.

## 2023-12-31 NOTE — Progress Notes (Signed)
 SUBJECTIVE:  Chief Complaint: Obesity  Interim History: Patient voices that she is really struggling with constipation currently.  She did a miralax clean out and is taking linzess for constipation.  She went up to 8 days without a bowel movement.  She is wondering about whether or not she needs to change medications from Ozempic to something else. She had a difficult month following her meal plan the last month.  She feels like she is eating the wrong foods and is more depressed.  She felt better yesterday because she was outside. She is dealing with quite a bit of lower back and left leg pain.  Jane [name] is here to discuss her progress with her obesity treatment plan. She is on the Category 3 Plan and keeping a food journal and adhering to recommended goals of 1450-1600 calories and 95 grams of protein and states she is following her eating plan approximately 0 % of the time. She states she is not exercising.  OBJECTIVE: Visit Diagnoses: Problem List Items Addressed This Visit       Cardiovascular and Mediastinum   Hypertension associated with diabetes (HCC) (Chronic)   BP slightly elevated today- patient in a significant amount of pain currently.      Relevant Medications   Semaglutide, 1 MG/DOSE, 4 MG/3ML SOPN     Endocrine   Type 2 diabetes mellitus with hyperglycemia (HCC) - Primary   Experiencing significant constipation side effects of Ozempic.  Did miralax clean out and now taking linzess.  Will decrease ozempic 1mg  and see if the helps with her constipation.      Relevant Medications   Semaglutide, 1 MG/DOSE, 4 MG/3ML SOPN     Other   Chronic anticoagulation (Coumadin)   Recent low INR of 1.3.  She had run out of medication.  Will get repeat INR today.       Vitals Temp: 97.7 F (36.5 C) BP: (!) 144/71 Pulse Rate: (!) 55 SpO2: 97 %   Anthropometric Measurements Height: 5\' 10"  (1.778 m) Weight: 233 lb (105.7 kg) BMI (Calculated): 33.43 Weight at Last Visit:  228 lb Weight Lost Since Last Visit: 0 Weight Gained Since Last Visit: 5 Starting Weight: 283 lb Total Weight Loss (lbs): 50 lb (22.7 kg)   Body Composition  Body Fat %: 48.6 % Fat Mass (lbs): 113.6 lbs Muscle Mass (lbs): 114.2 lbs Total Body Water (lbs): 89.6 lbs Visceral Fat Rating : 14   Other Clinical Data Today's Visit #: 68 Starting Date: 03/29/20 Comments: Cat 3, 1450-1600/95     ASSESSMENT AND PLAN:  Diet: [name] is currently in the action stage of change. As such, her goal is to continue with weight loss efforts and has agreed to the Category 3 Plan and keeping a food journal and adhering to recommended goals of 1450-1600 calories and 90 or more grams of protein daily. Patient to start food log or journaling meal plan.  The initial goal will be to habitually log or journal for at least 4 days a week.  The expectation it that patient may not initially meet calorie or protein goals as the nturitional understanding of food intake is begun.  We discussed the 10:1 ratio when reading a food label.  Patient agrees to keep a food log either electronically or on paper and bring to the next appointment to be able to dissect and discuss it with provider.    Exercise:  For substantial health benefits, adults should do at least 150 minutes (2 hours and  30 minutes) a week of moderate-intensity, or 75 minutes (1 hour and 15 minutes) a week of vigorous-intensity aerobic physical activity, or an equivalent combination of moderate- and vigorous-intensity aerobic activity. Aerobic activity should be performed in episodes of at least 10 minutes, and preferably, it should be spread throughout the week.  Behavior Modification:  We discussed the following Behavioral Modification Strategies today: increasing lean protein intake, increasing vegetables, meal planning and cooking strategies, and planning for success.   No follow-ups on file.Marland Kitchen She was informed of the importance of frequent follow up  visits to maximize her success with intensive lifestyle modifications for her multiple health conditions.  Attestation Statements:   Reviewed by clinician on day of visit: allergies, medications, problem list, medical history, surgical history, family history, social history, and previous encounter notes.    Reuben Likes, MD

## 2024-01-01 ENCOUNTER — Ambulatory Visit (INDEPENDENT_AMBULATORY_CARE_PROVIDER_SITE_OTHER): Payer: MEDICAID | Admitting: Family Medicine

## 2024-01-11 ENCOUNTER — Other Ambulatory Visit (HOSPITAL_COMMUNITY): Payer: Self-pay | Admitting: Psychiatry

## 2024-01-12 ENCOUNTER — Ambulatory Visit (INDEPENDENT_AMBULATORY_CARE_PROVIDER_SITE_OTHER)
Admission: RE | Admit: 2024-01-12 | Discharge: 2024-01-12 | Disposition: A | Discharge status: Home / Self Care | Payer: MEDICAID | Source: Ambulatory Visit | Attending: Vascular Surgery | Admitting: Vascular Surgery

## 2024-01-12 ENCOUNTER — Ambulatory Visit (HOSPITAL_COMMUNITY)
Admission: RE | Admit: 2024-01-12 | Discharge: 2024-01-12 | Disposition: A | Discharge status: Home / Self Care | Payer: MEDICAID | Source: Ambulatory Visit | Attending: Vascular Surgery | Admitting: Vascular Surgery

## 2024-01-12 DIAGNOSIS — Z86718 Personal history of other venous thrombosis and embolism: Secondary | ICD-10-CM

## 2024-01-12 DIAGNOSIS — I871 Compression of vein: Secondary | ICD-10-CM

## 2024-01-12 DIAGNOSIS — I87002 Postthrombotic syndrome without complications of left lower extremity: Secondary | ICD-10-CM | POA: Diagnosis present

## 2024-01-14 ENCOUNTER — Ambulatory Visit (INDEPENDENT_AMBULATORY_CARE_PROVIDER_SITE_OTHER): Payer: MEDICAID | Admitting: Physician Assistant

## 2024-01-14 ENCOUNTER — Ambulatory Visit: Payer: MEDICAID | Admitting: Student in an Organized Health Care Education/Training Program

## 2024-01-14 ENCOUNTER — Ambulatory Visit: Payer: MEDICAID

## 2024-01-14 VITALS — BP 109/61 | HR 50 | Temp 97.6°F | Ht 70.0 in | Wt 242.5 lb

## 2024-01-14 DIAGNOSIS — I871 Compression of vein: Secondary | ICD-10-CM

## 2024-01-14 DIAGNOSIS — Z86718 Personal history of other venous thrombosis and embolism: Secondary | ICD-10-CM | POA: Diagnosis not present

## 2024-01-14 NOTE — Progress Notes (Signed)
 [facility]           [facility] [place]  History and Physical   Jane [name] is a 58 y.o. 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 She now remains on Coumadin.   She returns today with c/o left leg swelling that has been worse over the past 3 weeks.  She states that she does elevate her legs.  She does not wear her compression.   She states that her INR got down to 1.0 and her swelling got worse after that.  She states there is family hx of leg swelling.    The pt is on a statin for cholesterol management.  The pt is on a daily aspirin.   Other AC:  coumadin The pt is on ACEI for hypertension.   Tobacco hx:  never  Pt does not have family hx of AAA.  Past Medical History:  Diagnosis Date   ADD (attention deficit disorder)    Allergy    Anxiety    Arthritis 2021   Back pain    Bilateral swelling of feet    Bipolar 1 disorder (HCC)    Bipolar 1 disorder (HCC)    Bipolar disorder (HCC)    Blood transfusion without reported diagnosis 2020   Chewing difficulty    Chronic fatigue syndrome    Chronic kidney disease    Stage 3 kidney disease;dx by Dr. Kari Baars.    Clotting disorder (HCC)    I'm on a blood  thinner   Constipation    Depression 2000   Diabetes mellitus without complication (HCC)    diet controlled   Dyspnea    with exertion   GERD (gastroesophageal reflux disease)    Headache    migraines   High cholesterol    History of blood clots    History of DVT (deep vein thrombosis)    left leg   Hypertension    states under control with meds., has been on med. x 2 years   Hypothyroidism    Joint pain    Neuromuscular disorder (HCC)    Neuropathy    Obsessive-compulsive disorder    Peripheral vascular  disease (HCC)    Prediabetes    Respiratory failure requiring intubation (HCC)    Restless leg syndrome    Shortness of breath    Sleep apnea    Thrombocytopenia (HCC) 09/15/2019   Trigger thumb of left hand 01/2018   Trigger thumb of right hand     Past Surgical History:  Procedure Laterality Date   ABDOMINAL HYSTERECTOMY  06/2016   complete   APPLICATION OF WOUND VAC Left 04/27/2019   Procedure: APPLICATION OF WOUND VAC LEFT GROIN;  Surgeon: Maeola Harman, MD;  Location: [facility] OR;  Service: Vascular;  Laterality: Left;   AV FISTULA PLACEMENT Left 11/24/2018   Procedure: ARTERIOVENOUS (AV) FISTULA CREATION LEFT SFA TO LEFT FEMORAL VEIN;  Surgeon: Maeola Harman, MD;  Location: [facility] OR;  Service: Vascular;  Laterality: Left;   BACK SURGERY     CHOLECYSTECTOMY     COLONOSCOPY WITH PROPOFOL N/A 11/22/2019   Procedure: COLONOSCOPY WITH PROPOFOL;  Surgeon: Wyline Mood, MD;  Location: [facility] ENDOSCOPY;  Service: Gastroenterology;  Laterality: N/A;   COLONOSCOPY WITH  PROPOFOL N/A 12/09/2022   Procedure: COLONOSCOPY WITH PROPOFOL;  Surgeon: Wyline Mood, MD;  Location: [facility] ENDOSCOPY;  Service: Gastroenterology;  Laterality: N/A;   FEMORAL ARTERY EXPLORATION  03/30/2019   Procedure: Left Common Femoral Artery and Vein Exploration;  Surgeon: Maeola Harman, MD;  Location: [facility] OR;  Service: Vascular;;   FEMORAL-FEMORAL BYPASS GRAFT Left 11/24/2018   Procedure: BYPASS GRAFT FEMORAL-FEMORAL VENOUS LEFT TO RIGHT PALMA PROCEDURE USING CRYOVEIN;  Surgeon: Maeola Harman, MD;  Location: [facility] OR;  Service: Vascular;  Laterality: Left;   GROIN DEBRIDEMENT Left 04/27/2019   Procedure: Drucie Ip DEBRIDEMENT;  Surgeon: Maeola Harman, MD;  Location: [facility] OR;  Service: Vascular;  Laterality: Left;   INSERTION OF ILIAC STENT  03/30/2019   Procedure: Stent of left common, external iliac veins and left common femoral vein;  Surgeon: Maeola Harman, MD;   Location: [facility] OR;  Service: Vascular;;   KNEE ARTHROSCOPY WITH MENISCAL REPAIR Left 11/14/2020   Procedure: LEFT KNEE ARTHROSCOPY WITH PARTIAL MEDIAL MENISCECTOMY;  Surgeon: Vickki Hearing, MD;  Location: [facility] ORS;  Service: Orthopedics;  Laterality: Left;   LOWER EXTREMITY VENOGRAPHY N/A 08/17/2018   Procedure: LOWER EXTREMITY VENOGRAPHY - Central Venogram;  Surgeon: Maeola Harman, MD;  Location: [facility] INVASIVE CV LAB;  Service: Cardiovascular;  Laterality: N/A;   LOWER EXTREMITY VENOGRAPHY Bilateral 03/09/2019   Procedure: LOWER EXTREMITY VENOGRAPHY;  Surgeon: Maeola Harman, MD;  Location: [facility] INVASIVE CV LAB;  Service: Cardiovascular;  Laterality: Bilateral;   LOWER EXTREMITY VENOGRAPHY Left 08/16/2019   Procedure: LOWER EXTREMITY VENOGRAPHY;  Surgeon: Maeola Harman, MD;  Location: [facility] INVASIVE CV LAB;  Service: Cardiovascular;  Laterality: Left;   LOWER EXTREMITY VENOGRAPHY Left 03/25/2022   Procedure: LOWER EXTREMITY VENOGRAPHY;  Surgeon: Maeola Harman, MD;  Location: [facility] INVASIVE CV LAB;  Service: Cardiovascular;  Laterality: Left;  IVUS   LUMBAR FUSION  11/21/2000   L5-S1   LUMBAR SPINE SURGERY     x 2 others   PATCH ANGIOPLASTY Left 03/30/2019   Procedure: Patch Angioplasty of the Left Common Femoral Vein using Venosure Biologic patch;  Surgeon: Maeola Harman, MD;  Location: [facility] OR;  Service: Vascular;  Laterality: Left;   PERIPHERAL VASCULAR INTERVENTION Left 08/16/2019   Procedure: PERIPHERAL VASCULAR INTERVENTION;  Surgeon: Maeola Harman, MD;  Location: [facility] INVASIVE CV LAB;  Service: Cardiovascular;  Laterality: Left;  common femoral/femoral vein stent   PERIPHERAL VASCULAR INTERVENTION Left 03/25/2022   Procedure: PERIPHERAL VASCULAR INTERVENTION;  Surgeon: Maeola Harman, MD;  Location: [facility] INVASIVE CV LAB;  Service: Cardiovascular;  Laterality: Left;  COMMON FEMORAL VEIN   PERIPHERAL VASCULAR THROMBECTOMY  Left 03/25/2022   Procedure: PERIPHERAL VASCULAR THROMBECTOMY;  Surgeon: Maeola Harman, MD;  Location: [facility] INVASIVE CV LAB;  Service: Cardiovascular;  Laterality: Left;  EXTREMITY/IVC   SPINE SURGERY     TRIGGER FINGER RELEASE Right 12/01/2017   Procedure: RELEASE TRIGGER FINGER/A-1 PULLEY RIGHT THUMB;  Surgeon: Betha Loa, MD;  Location: [facility];  Service: Orthopedics;  Laterality: Right;   TRIGGER FINGER RELEASE Left 01/26/2018   Procedure: LEFT TRIGGER THUMB RELEASE;  Surgeon: Betha Loa, MD;  Location: [facility];  Service: Orthopedics;  Laterality: Left;   ULTRASOUND GUIDANCE FOR VASCULAR ACCESS Right 03/30/2019   Procedure: Ultrasound-guided cannulation right internal jugular vein;  Surgeon: Maeola Harman, MD;  Location: [facility] OR;  Service: Vascular;  Laterality: Right;    Social History   Socioeconomic History   Marital status: Divorced  Spouse name: Not on file   Number of children: 3   Years of education: Not on file   Highest education level: GED or equivalent  Occupational History   Not on file  Tobacco Use   Smoking status: Never   Smokeless tobacco: Never  Vaping Use   Vaping status: Never Used  Substance and Sexual Activity   Alcohol use: Never   Drug use: Never   Sexual activity: Not Currently    Partners: Male    Birth control/protection: Surgical  Other Topics Concern   Not on file  Social History Narrative   Not on file   Social Drivers of Health   Financial Resource Strain: Medium Risk (11/28/2023)   Overall Financial Resource Strain (CARDIA)    Difficulty of Paying Living Expenses: Somewhat hard  Food Insecurity: Food Insecurity Present (11/28/2023)   Hunger Vital Sign    Worried About Running Out of Food in the Last Year: Often true    Ran Out of Food in the Last Year: Often true  Transportation Needs: Unmet Transportation Needs (11/28/2023)   PRAPARE - Transportation    Lack of Transportation  (Medical): Yes    Lack of Transportation (Non-Medical): Yes  Physical Activity: Sufficiently Active (11/28/2023)   Exercise Vital Sign    Days of Exercise per Week: 5 days    Minutes of Exercise per Session: 30 min  Stress: Stress Concern Present (11/28/2023)   [employer] of Occupational Health - Occupational Stress Questionnaire    Feeling of Stress : To some extent  Social Connections: Socially Isolated (11/28/2023)   Social Connection and Isolation Panel [NHANES]    Frequency of Communication with Friends and Family: More than three times a week    Frequency of Social Gatherings with Friends and Family: Once a week    Attends Religious Services: Never    Database administrator or Organizations: No    Attends Engineer, structural: Not on file    Marital Status: Divorced  Intimate Partner Violence: Unknown (02/07/2022)   Received from [employer], [facility]   HITS    Physically Hurt: Not on file    Insult or Talk Down To: Not on file    Threaten Physical Harm: Not on file    Scream or Curse: Not on file     Family History  Problem Relation Age of Onset   Heart disease Mother    Hyperlipidemia Mother    Hypertension Mother    Bipolar disorder Mother    Stroke Mother    Depression Mother    Sleep apnea Mother    Obesity Mother    Diabetes Father    Heart disease Father    Hyperlipidemia Father    Hypertension Father    Sleep apnea Father    Obesity Father    Drug abuse Daughter    ADD / ADHD Daughter    Drug abuse Daughter    Anxiety disorder Daughter    Bipolar disorder Daughter    Hypertension Sister    Hypertension Brother    Hyperlipidemia Brother    Heart disease Brother    Early death Brother    Kidney disease Brother    Bipolar disorder Maternal Aunt    Suicidality Maternal Aunt     Current Outpatient Medications  Medication Sig Dispense Refill   AJOVY 225 MG/1.5ML SOAJ Inject 1 mL into the skin every 30 (thirty) days.     ALPRAZolam  (XANAX) 1 MG tablet Take 1  tablet (1 mg total) by mouth 2 (two) times daily as needed. for anxiety 60 tablet 2   amphetamine-dextroamphetamine (ADDERALL) 30 MG tablet Take 1 tablet by mouth 2 (two) times daily. 60 tablet 0   amphetamine-dextroamphetamine (ADDERALL) 30 MG tablet Take 1 tablet by mouth 2 (two) times daily. 60 tablet 0   amphetamine-dextroamphetamine (ADDERALL) 30 MG tablet Take 1 tablet by mouth 2 (two) times daily. 60 tablet 0   amphetamine-dextroamphetamine (ADDERALL) 30 MG tablet Take 1 tablet by mouth 2 (two) times daily. 60 tablet 0   aspirin EC 81 MG tablet Take 81 mg by mouth daily.     atorvastatin (LIPITOR) 40 MG tablet Take 1 tablet (40 mg total) by mouth daily. 90 tablet 3   cetirizine (ZYRTEC) 10 MG tablet Take 1 tablet (10 mg total) by mouth daily. 90 tablet 3   chlorhexidine (PERIDEX) 0.12 % solution Use as directed 5 mLs in the mouth or throat 4 (four) times daily.     cromolyn (OPTICROM) 4 % ophthalmic solution Place 1 drop into both eyes 2 (two) times daily.     estradiol (ESTRACE VAGINAL) 0.1 MG/GM vaginal cream Apply 0.5mg  (pea-sized amount)  just inside the vaginal introitus with a finger-tip on Monday, Wednesday and Friday nights. 30 g 12   famotidine (PEPCID) 20 MG tablet Take 20 mg by mouth daily.     FLUoxetine (PROZAC) 40 MG capsule Take 1 capsule (40 mg total) by mouth 2 (two) times daily. 180 capsule 2   GARLIC PO Take 1 tablet by mouth daily.     HYDROcodone-acetaminophen (NORCO) 10-325 MG tablet Take 1 tablet by mouth every 6 (six) hours as needed. To last 30 days from fill date 120 tablet 0   [START ON 01/22/2024] HYDROcodone-acetaminophen (NORCO) 10-325 MG tablet Take 1 tablet by mouth every 6 (six) hours as needed. To last 30 days from fill date 120 tablet 0   [START ON 02/21/2024] HYDROcodone-acetaminophen (NORCO) 10-325 MG tablet Take 1 tablet by mouth every 6 (six) hours as needed. To last 30 days from fill date 120 tablet 0   ketoconazole (NIZORAL) 2  % cream Apply to affected areas rash in body folds once to twice daily until improved. May use daily as a preventative. 60 g 3   levothyroxine (SYNTHROID) 125 MCG tablet Take 1 tablet (125 mcg total) by mouth daily before breakfast. 90 tablet 3   linaclotide (LINZESS) 290 MCG CAPS capsule Take 1 capsule (290 mcg total) by mouth daily. 30 capsule 2   lisinopril (ZESTRIL) 10 MG tablet Take 1 tablet (10 mg total) by mouth daily. 90 tablet 1   methocarbamol (ROBAXIN) 750 MG tablet Take 1 tablet (750 mg total) by mouth 2 (two) times daily as needed for muscle spasms. 60 tablet 5   Multiple Vitamin (MULTIVITAMIN) capsule Take 1 capsule by mouth daily.     mupirocin ointment (BACTROBAN) 2 % Apply to open areas/sores twice daily until healed. 22 g 3   nitrofurantoin (MACRODANTIN) 100 MG capsule Take 1 capsule (100 mg total) by mouth daily. (Patient not taking: Reported on 12/09/2023) 30 capsule 11   omega-3 acid ethyl esters (LOVAZA) 1 g capsule Take by mouth.     oxybutynin (DITROPAN XL) 10 MG 24 hr tablet Take 1 tablet (10 mg total) by mouth daily. 30 tablet 11   pantoprazole (PROTONIX) 40 MG tablet Take 1 tablet (40 mg total) by mouth daily. 90 tablet 3   pimecrolimus (ELIDEL) 1 % cream Apply to affected  areas rash in body folds and legs once to twice daily until improved. 30 g 3   polyethylene glycol powder (GLYCOLAX/MIRALAX) 17 GM/SCOOP powder TAKE 17 Gram BY MOUTH DAILY 850 g 1   potassium chloride (KLOR-CON M) 10 MEQ tablet Take 1 tablet (10 mEq total) by mouth 3 (three) times daily. 270 tablet 3   pregabalin (LYRICA) 100 MG capsule TAKE ONE CAPSULE BY MOUTH FOUR TIMES DAILY 120 capsule 5   RESTASIS 0.05 % ophthalmic emulsion Place 1 drop into both eyes 2 (two) times daily.     rizatriptan (MAXALT-MLT) 10 MG disintegrating tablet Take 10 mg by mouth as needed for migraine.     rOPINIRole (REQUIP) 2 MG tablet TAKE 1 TABLET BY MOUTH TWICE DAILY 180 tablet 0   Semaglutide, 1 MG/DOSE, 4 MG/3ML SOPN Inject  1 mg as directed once a week. 3 mL 0   traZODone (DESYREL) 150 MG tablet Take 1 tablet (150 mg total) by mouth at bedtime. 30 tablet 2   valACYclovir (VALTREX) 1000 MG tablet Take 2 tablets by mouth at first onset of fever blisters, then take 2 tablets by mouth 12 hours later. 30 tablet 3   Vibegron (GEMTESA) 75 MG TABS Take 1 tablet (75 mg total) by mouth daily. 90 tablet 3   warfarin (COUMADIN) 5 MG tablet Take 7.5 mg daily except 5mg  on Saturday and Sunday. 45 tablet 11   No current facility-administered medications for this visit.    Allergies  Allergen Reactions   Aripiprazole Other (See Comments)    BECOMES  VIOLENT    Seroquel [Quetiapine Fumarate] Other (See Comments)    BECOMES VIOLENT   Chlorpromazine Other (See Comments)    SEVERE ANXIETY    Gabapentin Other (See Comments)    NIGHTMARES    Prednisone Hives   Quetiapine     REVIEW OF SYSTEMS:   [X]  denotes positive finding, [ ]  denotes negative finding Cardiac  Comments:  Chest pain or chest pressure:    Shortness of breath upon exertion:    Short of breath when lying flat:    Irregular heart rhythm:        Vascular    Pain in calf, thigh, or hip brought on by ambulation:    Pain in feet at night that wakes you up from your sleep:     Blood clot in your veins: x   Leg swelling:  x       Pulmonary    Oxygen at home:    Productive cough:     Wheezing:         Neurologic    Sudden weakness in arms or legs:     Sudden numbness in arms or legs:     Sudden onset of difficulty speaking or slurred speech:    Temporary loss of vision in one eye:     Problems with dizziness:         Gastrointestinal    Blood in stool:     Vomited blood:         Genitourinary    Burning when urinating:     Blood in urine:        Psychiatric    Major depression:         Hematologic    Bleeding problems:    Problems with blood clotting too easily:        Skin    Rashes or ulcers:        Constitutional    Fever or  chills:      PHYSICAL EXAMINATION:  Today's Vitals   01/14/24 0848 01/14/24 0849  BP:  109/61  Pulse:  (!) 50  Temp:  97.6 F (36.4 C)  TempSrc:  Temporal  SpO2:  95%  Weight:  242 lb 8 oz (110 kg)  Height:  5\' 10"  (1.778 m)  PainSc: 8     Body mass index is 34.8 kg/m.   General:  WDWN in NAD; vital signs documented above Gait: Not observed HENT: WNL, normocephalic Pulmonary: normal non-labored breathing without wheezing Cardiac: regular HR; without carotid bruits Abdomen: soft, NT, aortic pulse is not palpable Skin: without rashes Vascular Exam/Pulses:  Right Left  DP 1+ (weak) 1+ (weak)   Extremities: LLE swellign with left calf measuring 41cm and right calf measures 37cm  Neurologic: A&O X 3;  moving all extremities equally Psychiatric:  The pt has Normal affect.   Non-Invasive Vascular Imaging:   Venous duplex on 01/12/2024: +-----+---------------+---------+-----------+----------+--------------+  RIGHTCompressibilityPhasicitySpontaneityPropertiesThrombus Aging  +-----+---------------+---------+-----------+----------+--------------+  CFV Full           Yes      Yes                                  +-----+---------------+---------+-----------+----------+--------------+  SFJ Full                    Yes                                  +-----+---------------+---------+-----------+----------+--------------+     +---------+---------------+---------+-----------+----------+--------------+  LEFT    CompressibilityPhasicitySpontaneityPropertiesThrombus  Aging  +---------+---------------+---------+-----------+----------+--------------+  CFV     None           No       No                   Chronic        +---------+---------------+---------+-----------+----------+--------------+  SFJ     Full           Yes      Yes                                 +---------+---------------+---------+-----------+----------+--------------+  FV Prox   Partial        No       No                   Chronic        +---------+---------------+---------+-----------+----------+--------------+  FV Mid   Partial        No       Yes                  Chronic        +---------+---------------+---------+-----------+----------+--------------+  FV DistalPartial        No       Yes                  Chronic        +---------+---------------+---------+-----------+----------+--------------+  POP     Partial        No       Yes                  Chronic        +---------+---------------+---------+-----------+----------+--------------+  PTV     Partial  No                   Chronic        +---------+---------------+---------+-----------+----------+--------------+  PERO    Partial                 No                   Chronic        +---------+---------------+---------+-----------+----------+--------------+  GSV     Full           Yes      Yes                                 +---------+---------------+---------+-----------+----------+--------------+      Summary:  RIGHT:  - No evidence of common femoral vein obstruction.    LEFT:  - Chronic thrombus throughout the lower extremity.     Jane [name] is a 58 y.o. female who presents with: chronic LLE swelling with hx of May Thurner Syndrome with known occluded stent    -pt has 1+ palpable DP pedal pulses -leg swelling worsened when INR dropped down to 1. Dr. Randie Heinz felt she may have occluded a large collateral vein.   -pt duplex on 01/12/2024 reveals chronic thrombus throughout the left leg.  There is no acute thrombus.   -Dr. Randie Heinz discussed with pt about wearing knee high 15-20 mmHg compression stockings and pt was measured for these today.    -discussed continuing to avoid prolong sitting and standing and elevate legs when she is not up and about -pt is advised to continue as much walking as possible and avoid sitting or standing for long periods of  time.  -discussed water aerobics would also be beneficial.  -handout with recommendations given -pt will f/u 4 weeks with Dr. Randie Heinz with CT Venogram   Doreatha Massed, [facility] [phone]  Clinic MD:  pt seen with Dr. Randie Heinz

## 2024-01-19 ENCOUNTER — Other Ambulatory Visit: Payer: Self-pay

## 2024-01-19 DIAGNOSIS — I871 Compression of vein: Secondary | ICD-10-CM

## 2024-01-21 ENCOUNTER — Ambulatory Visit (INDEPENDENT_AMBULATORY_CARE_PROVIDER_SITE_OTHER): Payer: MEDICAID

## 2024-01-21 ENCOUNTER — Other Ambulatory Visit: Payer: Self-pay

## 2024-01-21 ENCOUNTER — Ambulatory Visit: Payer: MEDICAID

## 2024-01-21 ENCOUNTER — Ambulatory Visit: Payer: MEDICAID | Admitting: Vascular Surgery

## 2024-01-21 DIAGNOSIS — Z7901 Long term (current) use of anticoagulants: Secondary | ICD-10-CM | POA: Diagnosis not present

## 2024-01-21 DIAGNOSIS — R791 Abnormal coagulation profile: Secondary | ICD-10-CM | POA: Diagnosis not present

## 2024-01-21 LAB — POCT INR
INR: 4.3 — AB (ref 2.0–3.0)
POC INR: 4.3
PT: 51.5

## 2024-01-21 MED ORDER — WARFARIN SODIUM 1 MG PO TABS: ORAL_TABLET | ORAL | 1 refills | Status: DC

## 2024-01-21 NOTE — Patient Instructions (Signed)
 Description   Patient to hold Coumadin for 2 days, then start 6 mg q day except for 7 mg on Mondays.  She is to f/u in 2 weeks.

## 2024-01-22 ENCOUNTER — Encounter: Payer: Self-pay | Admitting: Family Medicine

## 2024-01-22 LAB — CBC WITH DIFFERENTIAL/PLATELET
Basophils Absolute: 0 10*3/uL (ref 0.0–0.2)
Basos: 0 %
EOS (ABSOLUTE): 0.1 10*3/uL (ref 0.0–0.4)
Eos: 1 %
Hematocrit: 35.2 % (ref 34.0–46.6)
Hemoglobin: 11.6 g/dL (ref 11.1–15.9)
Immature Grans (Abs): 0 10*3/uL (ref 0.0–0.1)
Immature Granulocytes: 0 %
Lymphocytes Absolute: 1.6 10*3/uL (ref 0.7–3.1)
Lymphs: 27 %
MCH: 31.1 pg (ref 26.6–33.0)
MCHC: 33 g/dL (ref 31.5–35.7)
MCV: 94 fL (ref 79–97)
Monocytes Absolute: 0.5 10*3/uL (ref 0.1–0.9)
Monocytes: 9 %
Neutrophils Absolute: 3.7 10*3/uL (ref 1.4–7.0)
Neutrophils: 63 %
Platelets: 130 10*3/uL — ABNORMAL LOW (ref 150–450)
RBC: 3.73 x10E6/uL — ABNORMAL LOW (ref 3.77–5.28)
RDW: 13 % (ref 11.7–15.4)
WBC: 6 10*3/uL (ref 3.4–10.8)

## 2024-01-23 ENCOUNTER — Encounter: Payer: Self-pay | Admitting: Family Medicine

## 2024-01-27 ENCOUNTER — Ambulatory Visit (INDEPENDENT_AMBULATORY_CARE_PROVIDER_SITE_OTHER): Payer: MEDICAID | Admitting: Family Medicine

## 2024-01-27 ENCOUNTER — Other Ambulatory Visit (HOSPITAL_COMMUNITY): Payer: Self-pay

## 2024-01-27 ENCOUNTER — Encounter (INDEPENDENT_AMBULATORY_CARE_PROVIDER_SITE_OTHER): Payer: Self-pay | Admitting: Family Medicine

## 2024-01-27 VITALS — BP 114/71 | HR 55 | Temp 97.7°F | Ht 67.5 in | Wt 231.0 lb

## 2024-01-27 DIAGNOSIS — E1165 Type 2 diabetes mellitus with hyperglycemia: Secondary | ICD-10-CM

## 2024-01-27 DIAGNOSIS — Z6835 Body mass index (BMI) 35.0-35.9, adult: Secondary | ICD-10-CM

## 2024-01-27 DIAGNOSIS — Z7901 Long term (current) use of anticoagulants: Secondary | ICD-10-CM

## 2024-01-27 DIAGNOSIS — Z7985 Long-term (current) use of injectable non-insulin antidiabetic drugs: Secondary | ICD-10-CM | POA: Diagnosis not present

## 2024-01-27 MED ORDER — SEMAGLUTIDE (1 MG/DOSE) 4 MG/3ML ~~LOC~~ SOPN
1.0000 mg | PEN_INJECTOR | SUBCUTANEOUS | 0 refills | Status: DC
  Filled 2024-01-27: qty 9, 84d supply, fill #0

## 2024-01-27 NOTE — Assessment & Plan Note (Signed)
 Patient on ozempic 1mg  with no side effects.  She needs a refill today.  Will send in 3 months as she has been stable on this dose and last [name] controlled at 5.6

## 2024-01-27 NOTE — Progress Notes (Signed)
 SUBJECTIVE:  Chief Complaint: Obesity  Interim History: Since last appointment patient fell off a two step ladder and had significant bruising over her body.  Her INR has been very elevated so the bruising was widespread.  Overall her food intake has not been as consistently on plan as she knows it needs to be.  She applied to the [facility] and is planning to join water aerobics twice a week.  She still doesn't have a vehicle so the only time she is getting out of her house is for doctors appointments.   Jane [name] is here to discuss her progress with her obesity treatment plan. She is on the Category 3 Plan and states she is following her eating plan approximately 50 % of the time. She states she is not exercising.  OBJECTIVE: Visit Diagnoses: Problem List Items Addressed This Visit       Endocrine   Type 2 diabetes mellitus with hyperglycemia (HCC) - Primary   Patient on ozempic 1mg  with no side effects.  She needs a refill today.  Will send in 3 months as she has been stable on this dose and last [name] controlled at 5.6      Relevant Medications   Semaglutide, 1 MG/DOSE, 4 MG/3ML SOPN     Other   Morbid obesity (HCC)   Relevant Medications   Semaglutide, 1 MG/DOSE, 4 MG/3ML SOPN   Chronic anticoagulation (Coumadin)   Last INR over 4.  She has an upcoming appointment at Coumadin clinic and has had medication alterations since that INR.  Will follow up at coumadin clinic for further management.      Other Visit Diagnoses       BMI 35.0-35.9,adult           Vitals Temp: 97.7 F (36.5 C) BP: 114/71 Pulse Rate: (!) 55 SpO2: 97 %   Anthropometric Measurements Height: 5' 7.5" (1.715 m) (rechecked height today) Weight: 231 lb (104.8 kg) BMI (Calculated): 35.62 Weight at Last Visit: 233 lb Weight Lost Since Last Visit: 2 lb Weight Gained Since Last Visit: 0 Starting Weight: 283 lb Total Weight Loss (lbs): 52 lb (23.6 kg) Peak Weight: 283 lb   Body Composition  Body Fat %:  47.8 % Fat Mass (lbs): 110.6 lbs Muscle Mass (lbs): 114.6 lbs Total Body Water (lbs): 86.2 lbs Visceral Fat Rating : 13   Other Clinical Data Fasting: no Labs: no Today's Visit #: 22 Starting Date: 03/29/20 Comments: rechecked height today     ASSESSMENT AND PLAN:  Diet: Jane [name] is currently in the action stage of change. As such, her goal is to continue with weight loss efforts and has agreed to the Category 3 Plan and keeping a food journal and adhering to recommended goals of 1450-1600 calories and 90 or more grams of protein daily.   Exercise:  All adults should avoid inactivity. Some activity is better than none, and adults who participate in any amount of physical activity, gain some health benefits.  Behavior Modification:  We discussed the following Behavioral Modification Strategies today: increasing lean protein intake, no skipping meals, meal planning and cooking strategies, emotional eating strategies , and keep a strict food journal.   Return in about 6 weeks (around 03/09/2024).Marland Kitchen She was informed of the importance of frequent follow up visits to maximize her success with intensive lifestyle modifications for her multiple health conditions.  Attestation Statements:   Reviewed by clinician on day of visit: allergies, medications, problem list, medical history, surgical history, family history, social  history, and previous encounter notes.    Reuben Likes, MD

## 2024-01-27 NOTE — Assessment & Plan Note (Signed)
 Last INR over 4.  She has an upcoming appointment at Coumadin clinic and has had medication alterations since that INR.  Will follow up at coumadin clinic for further management.

## 2024-02-04 ENCOUNTER — Ambulatory Visit (INDEPENDENT_AMBULATORY_CARE_PROVIDER_SITE_OTHER): Payer: MEDICAID

## 2024-02-04 DIAGNOSIS — Z7901 Long term (current) use of anticoagulants: Secondary | ICD-10-CM

## 2024-02-04 LAB — POCT INR
INR: 3.3 — AB (ref 2.0–3.0)
POC INR: 3.3
PT: 39.3

## 2024-02-04 NOTE — Patient Instructions (Signed)
 Description   6 mg q day  She is to f/u in 2 weeks.

## 2024-02-11 ENCOUNTER — Ambulatory Visit: Payer: MEDICAID | Admitting: Vascular Surgery

## 2024-02-11 ENCOUNTER — Other Ambulatory Visit: Payer: MEDICAID

## 2024-02-12 ENCOUNTER — Ambulatory Visit
Admission: RE | Admit: 2024-02-12 | Discharge: 2024-02-12 | Disposition: A | Discharge status: Home / Self Care | Payer: MEDICAID | Source: Ambulatory Visit | Attending: Vascular Surgery

## 2024-02-12 DIAGNOSIS — I871 Compression of vein: Secondary | ICD-10-CM

## 2024-02-12 MED ORDER — IOPAMIDOL (ISOVUE-370) INJECTION 76%
100.0000 mL | Freq: Once | INTRAVENOUS | Status: AC | PRN
  Administered 2024-02-12: 100 mL via INTRAVENOUS

## 2024-02-18 ENCOUNTER — Ambulatory Visit: Payer: MEDICAID

## 2024-02-18 ENCOUNTER — Encounter (HOSPITAL_COMMUNITY): Payer: Self-pay | Admitting: Psychiatry

## 2024-02-18 ENCOUNTER — Telehealth (HOSPITAL_COMMUNITY): Payer: MEDICAID | Admitting: Psychiatry

## 2024-02-18 DIAGNOSIS — F900 Attention-deficit hyperactivity disorder, predominantly inattentive type: Secondary | ICD-10-CM

## 2024-02-18 DIAGNOSIS — F3130 Bipolar disorder, current episode depressed, mild or moderate severity, unspecified: Secondary | ICD-10-CM | POA: Diagnosis not present

## 2024-02-18 MED ORDER — FLUOXETINE HCL 40 MG PO CAPS: 40.0000 mg | ORAL_CAPSULE | Freq: Two times a day (BID) | ORAL | 2 refills | Status: DC

## 2024-02-18 MED ORDER — AMPHETAMINE-DEXTROAMPHETAMINE 30 MG PO TABS: 1.0000 | ORAL_TABLET | Freq: Two times a day (BID) | ORAL | 0 refills | Status: DC

## 2024-02-18 MED ORDER — TRAZODONE HCL 150 MG PO TABS: 150.0000 mg | ORAL_TABLET | Freq: Every day | ORAL | 2 refills | Status: DC

## 2024-02-18 MED ORDER — ALPRAZOLAM 1 MG PO TABS: 1.0000 mg | ORAL_TABLET | Freq: Two times a day (BID) | ORAL | 2 refills | Status: DC | PRN

## 2024-02-18 MED ORDER — AMPHETAMINE-DEXTROAMPHETAMINE 30 MG PO TABS: 30.0000 mg | ORAL_TABLET | Freq: Two times a day (BID) | ORAL | 0 refills | Status: DC

## 2024-02-18 NOTE — Progress Notes (Signed)
 Virtual Visit via Video Note  I connected with Jane [name] on 02/18/24 at 11:20 AM EDT by a video enabled telemedicine application and verified that I am speaking with the correct person using two identifiers.  Location: Patient: home Provider: office   I discussed the limitations of evaluation and management by telemedicine and the availability of in person appointments. The patient expressed understanding and agreed to proceed.      I discussed the assessment and treatment plan with the patient. The patient was provided an opportunity to ask questions and all were answered. The patient agreed with the plan and demonstrated an understanding of the instructions.   The patient was advised to call back or seek an in-person evaluation if the symptoms worsen or if the condition fails to improve as anticipated.  I provided 20 minutes of non-face-to-face time during this encounter.   Diannia Ruder, MD  [facility] MD/PA/NP OP Progress Note  02/18/2024 11:37 AM Jane [name]  MRN:  [id number]  Chief Complaint:  Chief Complaint  Patient presents with   Anxiety   Depression   ADHD   HPI: This patient is a 58 year old divorced white female who lives alone in [place]. She is on disability due to bipolar disorder.  The patient returns for follow-up after 3 months regarding her anxiety depression mood swings and ADD.  For the most part she is doing okay.  Her INR is a little high and this makes her feel tired.  She is also having problems with her legs and recently had a CT scan but it did not show any blood clots.  Overall however her mood is good and she is trying to stay busy by planting her garden.  She still does not have a vehicle and has to rely on DSS or friends to pick her up.  Nevertheless the patient denies significant depression thoughts of self-harm or suicide.  Her mood has been stable.  She denies significant anxiety.  She is sleeping well.  She is focusing well with the  Adderall  Visit Diagnosis:    ICD-10-CM   1. Attention deficit hyperactivity disorder (ADHD), predominantly inattentive type  F90.0     2. Bipolar I disorder, most recent episode depressed (HCC)  F31.30       Past Psychiatric History: Long-term outpatient treatment  Past Medical History:  Past Medical History:  Diagnosis Date   ADD (attention deficit disorder)    Allergy    Anxiety    Arthritis 2021   Back pain    Bilateral swelling of feet    Bipolar 1 disorder (HCC)    Bipolar 1 disorder (HCC)    Bipolar disorder (HCC)    Blood transfusion without reported diagnosis 2020   Chewing difficulty    Chronic fatigue syndrome    Chronic kidney disease    Stage 3 kidney disease;dx by Dr. Kari Baars.    Clotting disorder (HCC)    I'm on a blood  thinner   Constipation    Depression 2000   Diabetes mellitus without complication (HCC)    diet controlled   Dyspnea    with exertion   GERD (gastroesophageal reflux disease)    Headache    migraines   High cholesterol    History of blood clots    History of DVT (deep vein thrombosis)    left leg   Hypertension    states under control with meds., has been on med. x 2 years   Hypothyroidism    Joint  pain    Neuromuscular disorder (HCC)    Neuropathy    Obsessive-compulsive disorder    Peripheral vascular disease (HCC)    Prediabetes    Respiratory failure requiring intubation (HCC)    Restless leg syndrome    Shortness of breath    Sleep apnea    Thrombocytopenia (HCC) 09/15/2019   Trigger thumb of left hand 01/2018   Trigger thumb of right hand     Past Surgical History:  Procedure Laterality Date   ABDOMINAL HYSTERECTOMY  06/2016   complete   APPLICATION OF WOUND VAC Left 04/27/2019   Procedure: APPLICATION OF WOUND VAC LEFT GROIN;  Surgeon: Maeola Harman, MD;  Location: [facility] OR;  Service: Vascular;  Laterality: Left;   AV FISTULA PLACEMENT Left 11/24/2018   Procedure: ARTERIOVENOUS (AV) FISTULA  CREATION LEFT SFA TO LEFT FEMORAL VEIN;  Surgeon: Maeola Harman, MD;  Location[facility] OR;  Service: Vascular;  Laterality: Left;   BACK SURGERY     CHOLECYSTECTOMY     COLONOSCOPY WITH PROPOFOL N/A 11/22/2019   Procedure: COLONOSCOPY WITH PROPOFOL;  Surgeon: Wyline Mood, MD;  Location: [facility] ENDOSCOPY;  Service: Gastroenterology;  Laterality: N/A;   COLONOSCOPY WITH PROPOFOL N/A 12/09/2022   Procedure: COLONOSCOPY WITH PROPOFOL;  Surgeon: Wyline Mood, MD;  Location: [facility] ENDOSCOPY;  Service: Gastroenterology;  Laterality: N/A;   FEMORAL ARTERY EXPLORATION  03/30/2019   Procedure: Left Common Femoral Artery and Vein Exploration;  Surgeon: Maeola Harman, MD;  Location: [facility] OR;  Service: Vascular;;   FEMORAL-FEMORAL BYPASS GRAFT Left 11/24/2018   Procedure: BYPASS GRAFT FEMORAL-FEMORAL VENOUS LEFT TO RIGHT PALMA PROCEDURE USING CRYOVEIN;  Surgeon: Maeola Harman, MD;  Location: [facility] OR;  Service: Vascular;  Laterality: Left;   GROIN DEBRIDEMENT Left 04/27/2019   Procedure: Drucie Ip DEBRIDEMENT;  Surgeon: Maeola Harman, MD;  Location: [facility] OR;  Service: Vascular;  Laterality: Left;   INSERTION OF ILIAC STENT  03/30/2019   Procedure: Stent of left common, external iliac veins and left common femoral vein;  Surgeon: Maeola Harman, MD;  Location[facility] OR;  Service: Vascular;;   KNEE ARTHROSCOPY WITH MENISCAL REPAIR Left 11/14/2020   Procedure: LEFT KNEE ARTHROSCOPY WITH PARTIAL MEDIAL MENISCECTOMY;  Surgeon: Vickki Hearing, MD;  Location: [facility] ORS;  Service: Orthopedics;  Laterality: Left;   LOWER EXTREMITY VENOGRAPHY N/A 08/17/2018   Procedure: LOWER EXTREMITY VENOGRAPHY - Central Venogram;  Surgeon: Maeola Harman, MD;  Location: [facility] INVASIVE CV LAB;  Service: Cardiovascular;  Laterality: N/A;   LOWER EXTREMITY VENOGRAPHY Bilateral 03/09/2019   Procedure: LOWER EXTREMITY VENOGRAPHY;  Surgeon: Maeola Harman, MD;  Location: [facility]  INVASIVE CV LAB;  Service: Cardiovascular;  Laterality: Bilateral;   LOWER EXTREMITY VENOGRAPHY Left 08/16/2019   Procedure: LOWER EXTREMITY VENOGRAPHY;  Surgeon: Maeola Harman, MD;  Location[facility] INVASIVE CV LAB;  Service: Cardiovascular;  Laterality: Left;   LOWER EXTREMITY VENOGRAPHY Left 03/25/2022   Procedure: LOWER EXTREMITY VENOGRAPHY;  Surgeon: Maeola Harman, MD;  Location: [facility] INVASIVE CV LAB;  Service: Cardiovascular;  Laterality: Left;  IVUS   LUMBAR FUSION  11/21/2000   L5-S1   LUMBAR SPINE SURGERY     x 2 others   PATCH ANGIOPLASTY Left 03/30/2019   Procedure: Patch Angioplasty of the Left Common Femoral Vein using Venosure Biologic patch;  Surgeon: Maeola Harman, MD;  Location: [facility] OR;  Service: Vascular;  Laterality: Left;   PERIPHERAL VASCULAR INTERVENTION Left 08/16/2019   Procedure: PERIPHERAL VASCULAR INTERVENTION;  Surgeon: Lemar Livings  Veryl Gottron, MD;  Location: [facility] INVASIVE CV LAB;  Service: Cardiovascular;  Laterality: Left;  common femoral/femoral vein stent   PERIPHERAL VASCULAR INTERVENTION Left 03/25/2022   Procedure: PERIPHERAL VASCULAR INTERVENTION;  Surgeon: Adine Hoof, MD;  Location: [facility] INVASIVE CV LAB;  Service: Cardiovascular;  Laterality: Left;  COMMON FEMORAL VEIN   PERIPHERAL VASCULAR THROMBECTOMY Left 03/25/2022   Procedure: PERIPHERAL VASCULAR THROMBECTOMY;  Surgeon: Adine Hoof, MD;  Location: [facility] INVASIVE CV LAB;  Service: Cardiovascular;  Laterality: Left;  EXTREMITY/IVC   SPINE SURGERY     TRIGGER FINGER RELEASE Right 12/01/2017   Procedure: RELEASE TRIGGER FINGER/A-1 PULLEY RIGHT THUMB;  Surgeon: Brunilda Capra, MD;  Location: [facility];  Service: Orthopedics;  Laterality: Right;   TRIGGER FINGER RELEASE Left 01/26/2018   Procedure: LEFT TRIGGER THUMB RELEASE;  Surgeon: Brunilda Capra, MD;  Location: [facility];  Service: Orthopedics;  Laterality: Left;    ULTRASOUND GUIDANCE FOR VASCULAR ACCESS Right 03/30/2019   Procedure: Ultrasound-guided cannulation right internal jugular vein;  Surgeon: Adine Hoof, MD;  Location: [facility] OR;  Service: Vascular;  Laterality: Right;    Family Psychiatric History: See below  Family History:  Family History  Problem Relation Age of Onset   Heart disease Mother    Hyperlipidemia Mother    Hypertension Mother    Bipolar disorder Mother    Stroke Mother    Depression Mother    Sleep apnea Mother    Obesity Mother    Diabetes Father    Heart disease Father    Hyperlipidemia Father    Hypertension Father    Sleep apnea Father    Obesity Father    Drug abuse Daughter    ADD / ADHD Daughter    Drug abuse Daughter    Anxiety disorder Daughter    Bipolar disorder Daughter    Hypertension Sister    Hypertension Brother    Hyperlipidemia Brother    Heart disease Brother    Early death Brother    Kidney disease Brother    Bipolar disorder Maternal Aunt    Suicidality Maternal Aunt     Social History:  Social History   Socioeconomic History   Marital status: Divorced    Spouse name: Not on file   Number of children: 3   Years of education: Not on file   Highest education level: GED or equivalent  Occupational History   Not on file  Tobacco Use   Smoking status: Never   Smokeless tobacco: Never  Vaping Use   Vaping status: Never Used  Substance and Sexual Activity   Alcohol use: Never   Drug use: Never   Sexual activity: Not Currently    Partners: Male    Birth control/protection: Surgical  Other Topics Concern   Not on file  Social History Narrative   Not on file   Social Drivers of Health   Financial Resource Strain: Medium Risk (11/28/2023)   Overall Financial Resource Strain (CARDIA)    Difficulty of Paying Living Expenses: Somewhat hard  Food Insecurity: Food Insecurity Present (11/28/2023)   Hunger Vital Sign    Worried About Running Out of Food in the Last  Year: Often true    Ran Out of Food in the Last Year: Often true  Transportation Needs: Unmet Transportation Needs (11/28/2023)   PRAPARE - Transportation    Lack of Transportation (Medical): Yes    Lack of Transportation (Non-Medical): Yes  Physical Activity: Sufficiently Active (11/28/2023)   Exercise  Vital Sign    Days of Exercise per Week: 5 days    Minutes of Exercise per Session: 30 min  Stress: Stress Concern Present (11/28/2023)   [employer] of Occupational Health - Occupational Stress Questionnaire    Feeling of Stress : To some extent  Social Connections: Socially Isolated (11/28/2023)   Social Connection and Isolation Panel [NHANES]    Frequency of Communication with Friends and Family: More than three times a week    Frequency of Social Gatherings with Friends and Family: Once a week    Attends Religious Services: Never    Database administrator or Organizations: No    Attends Engineer, structural: Not on file    Marital Status: Divorced    Allergies:  Allergies  Allergen Reactions   Aripiprazole Other (See Comments)    BECOMES  VIOLENT    Seroquel [Quetiapine Fumarate] Other (See Comments)    BECOMES VIOLENT   Chlorpromazine Other (See Comments)    SEVERE ANXIETY    Gabapentin Other (See Comments)    NIGHTMARES    Prednisone Hives   Quetiapine     Metabolic Disorder Labs: Lab Results  Component Value Date   HGBA1C 5.6 12/02/2023   MPG 131.24 11/06/2021   MPG 134.11 03/26/2019   No results found for: "PROLACTIN" Lab Results  Component Value Date   CHOL 167 12/02/2023   TRIG 160 (H) 12/02/2023   HDL 43 12/02/2023   CHOLHDL 3.9 12/02/2023   VLDL 37 11/06/2021   LDLCALC 96 12/02/2023   LDLCALC 109 (H) 05/09/2023   Lab Results  Component Value Date   TSH 1.200 12/02/2023   TSH 1.440 11/07/2022    Therapeutic Level Labs: No results found for: "LITHIUM" Lab Results  Component Value Date   VALPROATE 61.2 03/23/2014   No results  found for: "CBMZ"  Current Medications: Current Outpatient Medications  Medication Sig Dispense Refill   AJOVY 225 MG/1.5ML SOAJ Inject 1 mL into the skin every 30 (thirty) days.     ALPRAZolam (XANAX) 1 MG tablet Take 1 tablet (1 mg total) by mouth 2 (two) times daily as needed. for anxiety 60 tablet 2   amphetamine-dextroamphetamine (ADDERALL) 30 MG tablet Take 1 tablet by mouth 2 (two) times daily. 60 tablet 0   amphetamine-dextroamphetamine (ADDERALL) 30 MG tablet Take 1 tablet by mouth 2 (two) times daily. 60 tablet 0   amphetamine-dextroamphetamine (ADDERALL) 30 MG tablet Take 1 tablet by mouth 2 (two) times daily. 60 tablet 0   amphetamine-dextroamphetamine (ADDERALL) 30 MG tablet Take 1 tablet by mouth 2 (two) times daily. 60 tablet 0   aspirin EC 81 MG tablet Take 81 mg by mouth daily.     atorvastatin (LIPITOR) 40 MG tablet Take 1 tablet (40 mg total) by mouth daily. 90 tablet 3   cetirizine (ZYRTEC) 10 MG tablet Take 1 tablet (10 mg total) by mouth daily. 90 tablet 3   chlorhexidine (PERIDEX) 0.12 % solution Use as directed 5 mLs in the mouth or throat 4 (four) times daily.     cromolyn (OPTICROM) 4 % ophthalmic solution Place 1 drop into both eyes 2 (two) times daily.     estradiol (ESTRACE VAGINAL) 0.1 MG/GM vaginal cream Apply 0.5mg  (pea-sized amount)  just inside the vaginal introitus with a finger-tip on Monday, Wednesday and Friday nights. 30 g 12   famotidine (PEPCID) 20 MG tablet Take 20 mg by mouth daily.     FLUoxetine (PROZAC) 40 MG capsule Take 1  capsule (40 mg total) by mouth 2 (two) times daily. 180 capsule 2   GARLIC PO Take 1 tablet by mouth daily.     HYDROcodone-acetaminophen (NORCO) 10-325 MG tablet Take 1 tablet by mouth every 6 (six) hours as needed. To last 30 days from fill date 120 tablet 0   [START ON 02/21/2024] HYDROcodone-acetaminophen (NORCO) 10-325 MG tablet Take 1 tablet by mouth every 6 (six) hours as needed. To last 30 days from fill date 120 tablet 0    ketoconazole (NIZORAL) 2 % cream Apply to affected areas rash in body folds once to twice daily until improved. May use daily as a preventative. 60 g 3   levothyroxine (SYNTHROID) 125 MCG tablet Take 1 tablet (125 mcg total) by mouth daily before breakfast. 90 tablet 3   linaclotide (LINZESS) 290 MCG CAPS capsule Take 1 capsule (290 mcg total) by mouth daily. 30 capsule 2   lisinopril (ZESTRIL) 10 MG tablet Take 1 tablet (10 mg total) by mouth daily. 90 tablet 1   methocarbamol (ROBAXIN) 750 MG tablet Take 1 tablet (750 mg total) by mouth 2 (two) times daily as needed for muscle spasms. 60 tablet 5   Multiple Vitamin (MULTIVITAMIN) capsule Take 1 capsule by mouth daily.     mupirocin ointment (BACTROBAN) 2 % Apply to open areas/sores twice daily until healed. 22 g 3   nitrofurantoin (MACRODANTIN) 100 MG capsule Take 1 capsule (100 mg total) by mouth daily. 30 capsule 11   omega-3 acid ethyl esters (LOVAZA) 1 g capsule Take by mouth.     oxybutynin (DITROPAN XL) 10 MG 24 hr tablet Take 1 tablet (10 mg total) by mouth daily. 30 tablet 11   pantoprazole (PROTONIX) 40 MG tablet Take 1 tablet (40 mg total) by mouth daily. 90 tablet 3   pimecrolimus (ELIDEL) 1 % cream Apply to affected areas rash in body folds and legs once to twice daily until improved. 30 g 3   polyethylene glycol powder (GLYCOLAX/MIRALAX) 17 GM/SCOOP powder TAKE 17 Gram BY MOUTH DAILY 850 g 1   potassium chloride (KLOR-CON M) 10 MEQ tablet Take 1 tablet (10 mEq total) by mouth 3 (three) times daily. 270 tablet 3   pregabalin (LYRICA) 100 MG capsule TAKE ONE CAPSULE BY MOUTH FOUR TIMES DAILY 120 capsule 5   RESTASIS 0.05 % ophthalmic emulsion Place 1 drop into both eyes 2 (two) times daily.     rizatriptan (MAXALT-MLT) 10 MG disintegrating tablet Take 10 mg by mouth as needed for migraine.     rOPINIRole (REQUIP) 2 MG tablet TAKE 1 TABLET BY MOUTH TWICE DAILY 180 tablet 0   Semaglutide, 1 MG/DOSE, 4 MG/3ML SOPN Inject 1 mg as  directed once a week. 9 mL 0   traZODone (DESYREL) 150 MG tablet Take 1 tablet (150 mg total) by mouth at bedtime. 30 tablet 2   valACYclovir (VALTREX) 1000 MG tablet Take 2 tablets by mouth at first onset of fever blisters, then take 2 tablets by mouth 12 hours later. 30 tablet 3   Vibegron (GEMTESA) 75 MG TABS Take 1 tablet (75 mg total) by mouth daily. 90 tablet 3   warfarin (COUMADIN) 1 MG tablet Take one  along with 5 mg pill daily except for Mondays take 2 pills with 5 mg pill.  Equal to 6 mg daily except 7 mg on Monday. 45 tablet 1   warfarin (COUMADIN) 5 MG tablet Take 7.5 mg daily except 5mg  on Saturday and Sunday. 45 tablet 11  No current facility-administered medications for this visit.     Musculoskeletal: Strength & Muscle Tone: within normal limits Gait & Station: normal Patient leans: N/A  Psychiatric Specialty Exam: Review of Systems  Constitutional:  Positive for fatigue.  Musculoskeletal:  Positive for gait problem.  All other systems reviewed and are negative.   There were no vitals taken for this visit.There is no height or weight on file to calculate BMI.  General Appearance: Casual and Fairly Groomed  Eye Contact:  Good  Speech:  Clear and Coherent  Volume:  Normal  Mood:  Euthymic  Affect:  Congruent  Thought Process:  Goal Directed  Orientation:  Full (Time, Place, and Person)  Thought Content: WDL   Suicidal Thoughts:  No  Homicidal Thoughts:  No  Memory:  Immediate;   Good Recent;   Good Remote;   Fair  Judgement:  Good  Insight:  Good  Psychomotor Activity:  Decreased  Concentration:  Concentration: Good and Attention Span: Good  Recall:  Good  Fund of Knowledge: Good  Language: Good  Akathisia:  No  Handed:  Right  AIMS (if indicated): not done  Assets:  Communication Skills Desire for Improvement Resilience Social Support Talents/Skills  ADL's:  Intact  Cognition: WNL  Sleep:  Good   Screenings: GAD-7    Flowsheet Row Office  Visit from 10/06/2023 in [facility]  Total GAD-7 Score 5      PHQ2-9    Flowsheet Row Office Visit from 12/09/2023 in [facility] Procedure visit from 10/13/2023 in [facility] Visit from 10/06/2023 in [facility] Office Visit from 05/09/2023 in [facility] Office Visit from 03/14/2023 in [facility]  PHQ-2 Total Score 4 1 2 2 4   PHQ-9 Total Score -- -- 11 10 18       Flowsheet Row ED from 01/30/2023 in [facility] Video Visit from 01/02/2023 in [facility] [place] Admission (Discharged) from 12/09/2022 in [facility]  C-SSRS RISK CATEGORY No Risk No Risk No Risk        Assessment and Plan: This patient is a 58 year old female with a history of depression anxiety and ADD.  She continues to do well on her current regimen.  She will continue Prozac 80 mg daily for depression, Xanax 1 mg twice daily as needed for anxiety, Adderall 30 mg twice daily for ADD and trazodone 150 mg at bedtime for sleep.  She will return to see me in 3 months  Collaboration of Care: Collaboration of Care: Primary Care Provider AEB notes will be shared with PCP at patient's request  Patient/Guardian was advised Release of Information must be obtained prior to any record release in order to collaborate their care with an outside provider. Patient/Guardian was advised if they have not already done so to contact the registration department to sign all necessary forms in order for [place] to release information regarding their care.   Consent: Patient/Guardian gives verbal consent for treatment and assignment of benefits for services provided during this visit. Patient/Guardian expressed  understanding and agreed to proceed.    Diannia Ruder, MD 02/18/2024, 11:37 AM

## 2024-02-25 ENCOUNTER — Ambulatory Visit (INDEPENDENT_AMBULATORY_CARE_PROVIDER_SITE_OTHER): Payer: MEDICAID | Admitting: Family Medicine

## 2024-02-25 ENCOUNTER — Encounter: Payer: Self-pay | Admitting: Vascular Surgery

## 2024-02-25 ENCOUNTER — Ambulatory Visit (INDEPENDENT_AMBULATORY_CARE_PROVIDER_SITE_OTHER): Payer: MEDICAID | Admitting: Vascular Surgery

## 2024-02-25 ENCOUNTER — Ambulatory Visit: Payer: MEDICAID

## 2024-02-25 ENCOUNTER — Encounter: Payer: Self-pay | Admitting: Family Medicine

## 2024-02-25 VITALS — BP 121/70 | HR 55 | Resp 16 | Wt 242.0 lb

## 2024-02-25 VITALS — BP 127/76 | HR 56 | Temp 98.1°F | Resp 20 | Ht 67.5 in | Wt 241.0 lb

## 2024-02-25 DIAGNOSIS — M542 Cervicalgia: Secondary | ICD-10-CM | POA: Diagnosis not present

## 2024-02-25 DIAGNOSIS — Z7901 Long term (current) use of anticoagulants: Secondary | ICD-10-CM | POA: Diagnosis not present

## 2024-02-25 DIAGNOSIS — R221 Localized swelling, mass and lump, neck: Secondary | ICD-10-CM

## 2024-02-25 DIAGNOSIS — I87002 Postthrombotic syndrome without complications of left lower extremity: Secondary | ICD-10-CM | POA: Diagnosis not present

## 2024-02-25 DIAGNOSIS — I871 Compression of vein: Secondary | ICD-10-CM

## 2024-02-25 DIAGNOSIS — H538 Other visual disturbances: Secondary | ICD-10-CM

## 2024-02-25 DIAGNOSIS — K1370 Unspecified lesions of oral mucosa: Secondary | ICD-10-CM

## 2024-02-25 LAB — POCT INR
INR: 2.6 (ref 2.0–3.0)
POC INR: 2.6
PT: 31.5

## 2024-02-25 MED ORDER — METHYLPREDNISOLONE 4 MG PO TBPK: ORAL_TABLET | ORAL | 0 refills | Status: DC
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
## 2024-02-25 NOTE — Progress Notes (Signed)
 Patient ID: Jane [name], female   DOB: 1966/06/15, 58 y.o.   MRN: [id number]  Reason for Consult: Follow-up   Referred by Mazie Speed, MD  Subjective:     HPI:  Jane [name] is a 58 y.o. 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  She has been placed on Coumadin  her last visit her INR was subtherapeutic.  Now she states that she has been supratherapeutic and has easy bruising.  She has left knee pain wearing a brace today but also has significant swelling in the left lower extremity.  She is actually wearing a compression stocking that she was fitted for last time.  She states that her leg swelling is worse at the end of the day.  Past Medical History:  Diagnosis Date   ADD (attention deficit disorder)    Allergy    Anxiety    Arthritis 2021   Back pain    Bilateral swelling of feet    Bipolar 1 disorder (HCC)    Bipolar 1 disorder (HCC)    Bipolar disorder (HCC)    Blood transfusion without reported diagnosis 2020   Chewing difficulty    Chronic fatigue syndrome    Chronic kidney disease    Stage 3 kidney disease;dx by Dr. Mariea Shook.    Clotting disorder (HCC)    I'm on a blood  thinner   Constipation    Depression 2000   Diabetes mellitus without complication (HCC)    diet controlled   Dyspnea    with exertion   GERD (gastroesophageal reflux disease)    Headache    migraines   High cholesterol    History of blood clots    History of DVT (deep vein thrombosis)    left leg   Hypertension    states under control with meds., has been on med. x 2 years   Hypothyroidism    Joint pain    Neuromuscular disorder (HCC)    Neuropathy    Obsessive-compulsive disorder    Peripheral vascular  disease (HCC)    Prediabetes    Respiratory failure requiring intubation (HCC)    Restless leg syndrome    Shortness of breath    Sleep apnea    Thrombocytopenia (HCC) 09/15/2019   Trigger thumb of left hand 01/2018   Trigger thumb of right hand    Family History  Problem Relation Age of Onset   Heart disease Mother    Hyperlipidemia Mother    Hypertension Mother    Bipolar disorder Mother    Stroke Mother    Depression Mother    Sleep apnea Mother    Obesity Mother    Diabetes Father    Heart disease Father    Hyperlipidemia Father    Hypertension Father    Sleep apnea Father    Obesity Father    Drug abuse Daughter    ADD / ADHD Daughter    Drug abuse Daughter    Anxiety disorder Daughter    Bipolar disorder Daughter    Hypertension Sister    Hypertension Brother    Hyperlipidemia Brother    Heart disease Brother    Early death Brother    Kidney disease Brother    Bipolar disorder Maternal Aunt    Suicidality Maternal Aunt  Past Surgical History:  Procedure Laterality Date   ABDOMINAL HYSTERECTOMY  06/2016   complete   APPLICATION OF WOUND VAC Left 04/27/2019   Procedure: APPLICATION OF WOUND VAC LEFT GROIN;  Surgeon: Adine Hoof, MD;  Location: [facility] OR;  Service: Vascular;  Laterality: Left;   AV FISTULA PLACEMENT Left 11/24/2018   Procedure: ARTERIOVENOUS (AV) FISTULA CREATION LEFT SFA TO LEFT FEMORAL VEIN;  Surgeon: Adine Hoof, MD;  Location: [facility] OR;  Service: Vascular;  Laterality: Left;   BACK SURGERY     CHOLECYSTECTOMY     COLONOSCOPY WITH PROPOFOL  N/A 11/22/2019   Procedure: COLONOSCOPY WITH PROPOFOL ;  Surgeon: Luke Salaam, MD;  Location: [facility] ENDOSCOPY;  Service: Gastroenterology;  Laterality: N/A;   COLONOSCOPY WITH PROPOFOL  N/A 12/09/2022   Procedure: COLONOSCOPY WITH PROPOFOL ;  Surgeon: Luke Salaam, MD;  Location: [facility] ENDOSCOPY;  Service: Gastroenterology;  Laterality: N/A;   FEMORAL ARTERY EXPLORATION  03/30/2019    Procedure: Left Common Femoral Artery and Vein Exploration;  Surgeon: Adine Hoof, MD;  Location: [facility] OR;  Service: Vascular;;   FEMORAL-FEMORAL BYPASS GRAFT Left 11/24/2018   Procedure: BYPASS GRAFT FEMORAL-FEMORAL VENOUS LEFT TO RIGHT PALMA PROCEDURE USING CRYOVEIN;  Surgeon: Adine Hoof, MD;  Location: [facility] OR;  Service: Vascular;  Laterality: Left;   GROIN DEBRIDEMENT Left 04/27/2019   Procedure: Arnetha Bhat DEBRIDEMENT;  Surgeon: Adine Hoof, MD;  Location: [facility] OR;  Service: Vascular;  Laterality: Left;   INSERTION OF ILIAC STENT  03/30/2019   Procedure: Stent of left common, external iliac veins and left common femoral vein;  Surgeon: Adine Hoof, MD;  Location: [facility] OR;  Service: Vascular;;   KNEE ARTHROSCOPY WITH MENISCAL REPAIR Left 11/14/2020   Procedure: LEFT KNEE ARTHROSCOPY WITH PARTIAL MEDIAL MENISCECTOMY;  Surgeon: Darrin Emerald, MD;  Location: [facility] ORS;  Service: Orthopedics;  Laterality: Left;   LOWER EXTREMITY VENOGRAPHY N/A 08/17/2018   Procedure: LOWER EXTREMITY VENOGRAPHY - Central Venogram;  Surgeon: Adine Hoof, MD;  Location: [facility] INVASIVE CV LAB;  Service: Cardiovascular;  Laterality: N/A;   LOWER EXTREMITY VENOGRAPHY Bilateral 03/09/2019   Procedure: LOWER EXTREMITY VENOGRAPHY;  Surgeon: Adine Hoof, MD;  Location: [facility] INVASIVE CV LAB;  Service: Cardiovascular;  Laterality: Bilateral;   LOWER EXTREMITY VENOGRAPHY Left 08/16/2019   Procedure: LOWER EXTREMITY VENOGRAPHY;  Surgeon: Adine Hoof, MD;  Location: [facility] INVASIVE CV LAB;  Service: Cardiovascular;  Laterality: Left;   LOWER EXTREMITY VENOGRAPHY Left 03/25/2022   Procedure: LOWER EXTREMITY VENOGRAPHY;  Surgeon: Adine Hoof, MD;  Location: [facility] INVASIVE CV LAB;  Service: Cardiovascular;  Laterality: Left;  IVUS   LUMBAR FUSION  11/21/2000   L5-S1   LUMBAR SPINE SURGERY     x 2 others   PATCH ANGIOPLASTY Left 03/30/2019    Procedure: Patch Angioplasty of the Left Common Femoral Vein using Venosure Biologic patch;  Surgeon: Adine Hoof, MD;  Location: [facility] OR;  Service: Vascular;  Laterality: Left;   PERIPHERAL VASCULAR INTERVENTION Left 08/16/2019   Procedure: PERIPHERAL VASCULAR INTERVENTION;  Surgeon: Adine Hoof, MD;  Location: [facility] INVASIVE CV LAB;  Service: Cardiovascular;  Laterality: Left;  common femoral/femoral vein stent   PERIPHERAL VASCULAR INTERVENTION Left 03/25/2022   Procedure: PERIPHERAL VASCULAR INTERVENTION;  Surgeon: Adine Hoof, MD;  Location: [facility] INVASIVE CV LAB;  Service: Cardiovascular;  Laterality: Left;  COMMON FEMORAL VEIN   PERIPHERAL VASCULAR THROMBECTOMY Left 03/25/2022   Procedure: PERIPHERAL VASCULAR THROMBECTOMY;  Surgeon: Adine Hoof, MD;  Location: [facility] INVASIVE  CV LAB;  Service: Cardiovascular;  Laterality: Left;  EXTREMITY/IVC   SPINE SURGERY     TRIGGER FINGER RELEASE Right 12/01/2017   Procedure: RELEASE TRIGGER FINGER/A-1 PULLEY RIGHT THUMB;  Surgeon: Brunilda Capra, MD;  Location: [facility];  Service: Orthopedics;  Laterality: Right;   TRIGGER FINGER RELEASE Left 01/26/2018   Procedure: LEFT TRIGGER THUMB RELEASE;  Surgeon: Brunilda Capra, MD;  Location: [facility];  Service: Orthopedics;  Laterality: Left;   ULTRASOUND GUIDANCE FOR VASCULAR ACCESS Right 03/30/2019   Procedure: Ultrasound-guided cannulation right internal jugular vein;  Surgeon: Adine Hoof, MD;  Location: [facility] OR;  Service: Vascular;  Laterality: Right;    Short Social History:  Social History   Tobacco Use   Smoking status: Never   Smokeless tobacco: Never  Substance Use Topics   Alcohol use: Never    Allergies  Allergen Reactions   Aripiprazole Other (See Comments)    BECOMES  VIOLENT    Seroquel  [Quetiapine  Fumarate] Other (See Comments)    BECOMES VIOLENT   Chlorpromazine  Other (See Comments)     SEVERE ANXIETY    Gabapentin  Other (See Comments)    NIGHTMARES    Prednisone  Hives   Quetiapine      Current Outpatient Medications  Medication Sig Dispense Refill   AJOVY 225 MG/1.5ML SOAJ Inject 1 mL into the skin every 30 (thirty) days.     ALPRAZolam  (XANAX ) 1 MG tablet Take 1 tablet (1 mg total) by mouth 2 (two) times daily as needed. for anxiety 60 tablet 2   amphetamine -dextroamphetamine  (ADDERALL) 30 MG tablet Take 1 tablet by mouth 2 (two) times daily. 60 tablet 0   amphetamine -dextroamphetamine  (ADDERALL) 30 MG tablet Take 1 tablet by mouth 2 (two) times daily. 60 tablet 0   amphetamine -dextroamphetamine  (ADDERALL) 30 MG tablet Take 1 tablet by mouth 2 (two) times daily. 60 tablet 0   amphetamine -dextroamphetamine  (ADDERALL) 30 MG tablet Take 1 tablet by mouth 2 (two) times daily. 60 tablet 0   aspirin  EC 81 MG tablet Take 81 mg by mouth daily.     atorvastatin  (LIPITOR) 40 MG tablet Take 1 tablet (40 mg total) by mouth daily. 90 tablet 3   cetirizine  (ZYRTEC ) 10 MG tablet Take 1 tablet (10 mg total) by mouth daily. 90 tablet 3   chlorhexidine  (PERIDEX ) 0.12 % solution Use as directed 5 mLs in the mouth or throat 4 (four) times daily.     cromolyn (OPTICROM) 4 % ophthalmic solution Place 1 drop into both eyes 2 (two) times daily.     estradiol  (ESTRACE  VAGINAL) 0.1 MG/GM vaginal cream Apply 0.5mg  (pea-sized amount)  just inside the vaginal introitus with a finger-tip on Monday, Wednesday and Friday nights. 30 g 12   famotidine  (PEPCID ) 20 MG tablet Take 20 mg by mouth daily.     FLUoxetine  (PROZAC ) 40 MG capsule Take 1 capsule (40 mg total) by mouth 2 (two) times daily. 180 capsule 2   GARLIC PO Take 1 tablet by mouth daily.     HYDROcodone -acetaminophen  (NORCO) 10-325 MG tablet Take 1 tablet by mouth every 6 (six) hours as needed. To last 30 days from fill date 120 tablet 0   ketoconazole  (NIZORAL ) 2 % cream Apply to affected areas rash in body folds once to twice daily until  improved. May use daily as a preventative. 60 g 3   levothyroxine  (SYNTHROID ) 125 MCG tablet Take 1 tablet (125 mcg total) by mouth daily before breakfast. 90 tablet 3  linaclotide  (LINZESS ) 290 MCG CAPS capsule Take 1 capsule (290 mcg total) by mouth daily. 30 capsule 2   lisinopril  (ZESTRIL ) 10 MG tablet Take 1 tablet (10 mg total) by mouth daily. 90 tablet 1   methocarbamol  (ROBAXIN ) 750 MG tablet Take 1 tablet (750 mg total) by mouth 2 (two) times daily as needed for muscle spasms. 60 tablet 5   Multiple Vitamin (MULTIVITAMIN) capsule Take 1 capsule by mouth daily.     mupirocin  ointment (BACTROBAN ) 2 % Apply to open areas/sores twice daily until healed. 22 g 3   nitrofurantoin  (MACRODANTIN ) 100 MG capsule Take 1 capsule (100 mg total) by mouth daily. 30 capsule 11   omega-3 acid ethyl esters (LOVAZA) 1 g capsule Take by mouth.     oxybutynin  (DITROPAN  XL) 10 MG 24 hr tablet Take 1 tablet (10 mg total) by mouth daily. 30 tablet 11   pantoprazole  (PROTONIX ) 40 MG tablet Take 1 tablet (40 mg total) by mouth daily. 90 tablet 3   pimecrolimus  (ELIDEL ) 1 % cream Apply to affected areas rash in body folds and legs once to twice daily until improved. 30 g 3   polyethylene glycol powder (GLYCOLAX /MIRALAX ) 17 GM/SCOOP powder TAKE 17 Gram BY MOUTH DAILY 850 g 1   potassium chloride  (KLOR-CON  M) 10 MEQ tablet Take 1 tablet (10 mEq total) by mouth 3 (three) times daily. 270 tablet 3   pregabalin  (LYRICA ) 100 MG capsule TAKE ONE CAPSULE BY MOUTH FOUR TIMES DAILY 120 capsule 5   RESTASIS  0.05 % ophthalmic emulsion Place 1 drop into both eyes 2 (two) times daily.     rizatriptan (MAXALT-MLT) 10 MG disintegrating tablet Take 10 mg by mouth as needed for migraine.     rOPINIRole  (REQUIP ) 2 MG tablet TAKE 1 TABLET BY MOUTH TWICE DAILY 180 tablet 0   Semaglutide , 1 MG/DOSE, 4 MG/3ML SOPN Inject 1 mg as directed once a week. 9 mL 0   traZODone  (DESYREL ) 150 MG tablet Take 1 tablet (150 mg total) by mouth at  bedtime. 30 tablet 2   valACYclovir  (VALTREX ) 1000 MG tablet Take 2 tablets by mouth at first onset of fever blisters, then take 2 tablets by mouth 12 hours later. 30 tablet 3   Vibegron  (GEMTESA ) 75 MG TABS Take 1 tablet (75 mg total) by mouth daily. 90 tablet 3   warfarin (COUMADIN ) 1 MG tablet Take one  along with 5 mg pill daily except for Mondays take 2 pills with 5 mg pill.  Equal to 6 mg daily except 7 mg on Monday. 45 tablet 1   warfarin (COUMADIN ) 5 MG tablet Take 7.5 mg daily except 5mg  on Saturday and Sunday. 45 tablet 11   No current facility-administered medications for this visit.    Review of Systems  Constitutional:  Constitutional negative. HENT: HENT negative.  Eyes: Eyes negative.  Respiratory: Respiratory negative.  Cardiovascular: Positive for leg swelling.  GI: Gastrointestinal negative.  Musculoskeletal: Positive for gait problem, leg pain and joint pain.  Skin: Skin negative.  Hematologic: Positive for bruises/bleeds easily.  Psychiatric: Psychiatric negative.        Objective:  Objective   Vitals:   02/25/24 1029  BP: 127/76  Pulse: (!) 56  Resp: 20  Temp: 98.1 F (36.7 C)  SpO2: 92%  Weight: 241 lb (109.3 kg)  Height: 5' 7.5" (1.715 m)   Body mass index is 37.19 kg/m.  Physical Exam HENT:     Head: Normocephalic.  Pulmonary:     Effort:  Pulmonary effort is normal.  Abdominal:     General: Abdomen is flat.  Musculoskeletal:     Comments: Wearing compression left lower extremity Right lower extremity measures 37 cm, left lower extremity measures 41 cm (measured 10 cm from tibial tuberosity)  Skin:    Comments: Bruise on right forearm  Neurological:     General: No focal deficit present.     Mental Status: She is alert.  Psychiatric:        Mood and Affect: Mood normal.     Data: CTV IMPRESSION: 1. The extensive overlapping left-sided iliofemoral stents extending from the left common iliac origin to the level of the proximal  left femoral vein demonstrate poor internal opacification and may be chronically occluded. Slow internal flow cannot be excluded by CT. 2. Below the stented femoral vein, the native femoral vein in the proximal to mid left thigh appears atretic. There are numerous superficial collateral veins communicating with the great saphenous vein and extending over the pubic region and left abdominal wall. 3. Right iliac veins are normally patent. 4. Status post cholecystectomy with stable dilatation of the common bile duct post cholecystectomy measuring up to 11 mm. 5. Stable degenerative disc disease of the lumbar spine with prior cage fusion at L5-S1.       Assessment/Plan:    58 year old female with extensive previous left lower extremity venous history now with and 10% larger left leg despite having compression stocking on.  Her leg is stable in size from last visit and the smallest I have ever seen her left lower extremity was 38.5 cm 8 months ago when the stents were noted to be occluded.  She is having significant pain. We have discussed venography although I am not sure of our options.  This will be performed on a Monday in the near future and we will not hold Coumadin  although I told her if we need to be closer to 2 and not supratherapeutic at the time of procedure.  She demonstrated good understanding and will plan for venography from the left popliteal or small saphenous vein approach to possibly recanalize the venous stents and possibly plan further intervention.  She is to continue Coumadin  and compression stockings and would benefit from weight loss and this was discussed with her today.     Adine Hoof MD [facility]

## 2024-02-26 ENCOUNTER — Other Ambulatory Visit: Payer: Self-pay

## 2024-02-26 ENCOUNTER — Other Ambulatory Visit: Payer: Self-pay | Admitting: Family Medicine

## 2024-02-26 ENCOUNTER — Ambulatory Visit: Payer: MEDICAID | Admitting: Physician Assistant

## 2024-02-26 DIAGNOSIS — D509 Iron deficiency anemia, unspecified: Secondary | ICD-10-CM

## 2024-02-26 DIAGNOSIS — D649 Anemia, unspecified: Secondary | ICD-10-CM

## 2024-02-27 ENCOUNTER — Other Ambulatory Visit: Payer: Self-pay | Admitting: Family Medicine

## 2024-02-27 ENCOUNTER — Other Ambulatory Visit: Payer: Self-pay | Admitting: Urology

## 2024-02-27 ENCOUNTER — Inpatient Hospital Stay: Payer: MEDICAID | Attending: Hematology

## 2024-02-27 DIAGNOSIS — D649 Anemia, unspecified: Secondary | ICD-10-CM

## 2024-02-27 DIAGNOSIS — D509 Iron deficiency anemia, unspecified: Secondary | ICD-10-CM | POA: Diagnosis present

## 2024-02-27 DIAGNOSIS — N3281 Overactive bladder: Secondary | ICD-10-CM

## 2024-02-27 LAB — CBC WITH DIFFERENTIAL/PLATELET
Abs Immature Granulocytes: 0.02 10*3/uL (ref 0.00–0.07)
Basophils Absolute: 0 10*3/uL (ref 0.0–0.1)
Basophils Relative: 0 %
Eosinophils Absolute: 0 10*3/uL (ref 0.0–0.5)
Eosinophils Relative: 0 %
HCT: 40.2 % (ref 36.0–46.0)
Hemoglobin: 12.9 g/dL (ref 12.0–15.0)
Immature Granulocytes: 0 %
Lymphocytes Relative: 12 %
Lymphs Abs: 0.7 10*3/uL (ref 0.7–4.0)
MCH: 30.6 pg (ref 26.0–34.0)
MCHC: 32.1 g/dL (ref 30.0–36.0)
MCV: 95.5 fL (ref 80.0–100.0)
Monocytes Absolute: 0.6 10*3/uL (ref 0.1–1.0)
Monocytes Relative: 10 %
Neutro Abs: 4.8 10*3/uL (ref 1.7–7.7)
Neutrophils Relative %: 78 %
Platelets: 137 10*3/uL — ABNORMAL LOW (ref 150–400)
RBC: 4.21 MIL/uL (ref 3.87–5.11)
RDW: 13 % (ref 11.5–15.5)
WBC: 6.1 10*3/uL (ref 4.0–10.5)
nRBC: 0 % (ref 0.0–0.2)

## 2024-02-27 LAB — FERRITIN: Ferritin: 124 ng/mL (ref 11–307)

## 2024-02-27 LAB — COMPREHENSIVE METABOLIC PANEL WITH GFR
ALT: 18 U/L (ref 0–44)
AST: 15 U/L (ref 15–41)
Albumin: 4.3 g/dL (ref 3.5–5.0)
Alkaline Phosphatase: 72 U/L (ref 38–126)
Anion gap: 11 (ref 5–15)
BUN: 27 mg/dL — ABNORMAL HIGH (ref 6–20)
CO2: 27 mmol/L (ref 22–32)
Calcium: 9.9 mg/dL (ref 8.9–10.3)
Chloride: 100 mmol/L (ref 98–111)
Creatinine, Ser: 1.24 mg/dL — ABNORMAL HIGH (ref 0.44–1.00)
GFR, Estimated: 51 mL/min — ABNORMAL LOW (ref >60)
Glucose, Bld: 118 mg/dL — ABNORMAL HIGH (ref 70–99)
Potassium: 4.3 mmol/L (ref 3.5–5.1)
Sodium: 138 mmol/L (ref 135–145)
Total Bilirubin: 0.6 mg/dL (ref 0.0–1.2)
Total Protein: 8 g/dL (ref 6.5–8.1)

## 2024-02-27 LAB — LACTATE DEHYDROGENASE: LDH: 179 U/L (ref 98–192)

## 2024-02-27 LAB — IRON AND TIBC
Iron: 83 ug/dL (ref 28–170)
Saturation Ratios: 25 % (ref 10.4–31.8)
TIBC: 334 ug/dL (ref 250–450)
UIBC: 251 ug/dL

## 2024-02-27 NOTE — Telephone Encounter (Signed)
 LOV  02/25/2024, CPE done on 12/02/2023.  Requested Prescriptions  Pending Prescriptions Disp Refills   LINZESS  290 MCG CAPS capsule [Pharmacy Med Name: Linzess  290 mcg capsule] 30 capsule 2    Sig: TAKE ONE CAPSULE BY MOUTH DAILY     Gastroenterology: Irritable Bowel Syndrome Failed - 02/27/2024  9:16 AM      Failed - Valid encounter within last 12 months    Recent Outpatient Visits           2 days ago Neck pain, bilateral   [place] [facility] [place], Asencion Blacksmith, DO       Future Appointments             In 1 week Kathreen Pare, PA-C [facility] [place]   In 3 months Bacigalupo, Stan Eans, MD [facility]   In 7 months MacDiarmid, Geralyn Knee, MD [facility] [place]   In 7 months MacDiarmid, Geralyn Knee, MD [facility]

## 2024-02-27 NOTE — Telephone Encounter (Signed)
 Requested Prescriptions  Pending Prescriptions Disp Refills   pantoprazole  (PROTONIX ) 40 MG tablet [Pharmacy Med Name: pantoprazole  40 mg tablet,delayed release] 90 tablet 0    Sig: TAKE 1 TABLET BY MOUTH DAILY     Gastroenterology: Proton Pump Inhibitors Failed - 02/27/2024  3:02 PM      Failed - Valid encounter within last 12 months    Recent Outpatient Visits           2 days ago Neck pain, bilateral   [place] [facility], Asencion Blacksmith, DO       Future Appointments             In 1 week Kathreen Pare, PA-C [place] [facility] [place]   In 3 months Bacigalupo, Stan Eans, MD [facility]   In 7 months MacDiarmid, Geralyn Knee, MD [facility] [place]   In 7 months MacDiarmid, Geralyn Knee, MD [facility] [place]             rOPINIRole  (REQUIP ) 2 MG tablet [Pharmacy Med Name: ropinirole  2 mg tablet] 180 tablet 0    Sig: TAKE 1 TABLET BY MOUTH TWICE DAILY     Neurology:  Parkinsonian Agents Failed - 02/27/2024  3:02 PM      Failed - Valid encounter within last 12 months    Recent Outpatient Visits           2 days ago Neck pain, bilateral   [place] [facility], Asencion Blacksmith, DO       Future Appointments             In 1 week Kathreen Pare, PA-C [place] [facility] [place]   In 3 months Bacigalupo, Stan Eans, MD [facility]   In 7 months MacDiarmid, Geralyn Knee, MD [facility] [place]   In 7 months Erman Hayward, MD [facility] [place]            Passed - Last BP in normal range    BP Readings from Last 1 Encounters:  02/25/24 121/70         Passed - Last Heart Rate in normal range    Pulse Readings from Last 1 Encounters:  02/25/24 (!) 55

## 2024-03-04 ENCOUNTER — Other Ambulatory Visit: Payer: Self-pay

## 2024-03-04 DIAGNOSIS — I87002 Postthrombotic syndrome without complications of left lower extremity: Secondary | ICD-10-CM

## 2024-03-05 ENCOUNTER — Inpatient Hospital Stay: Payer: MEDICAID | Admitting: Oncology

## 2024-03-08 ENCOUNTER — Encounter: Payer: Self-pay | Admitting: Physician Assistant

## 2024-03-08 ENCOUNTER — Ambulatory Visit: Payer: MEDICAID | Admitting: Physician Assistant

## 2024-03-08 ENCOUNTER — Inpatient Hospital Stay: Payer: MEDICAID | Admitting: Physician Assistant

## 2024-03-08 ENCOUNTER — Encounter: Payer: Self-pay | Admitting: Hematology

## 2024-03-08 VITALS — BP 109/67 | HR 55 | Ht 70.0 in | Wt 237.0 lb

## 2024-03-08 DIAGNOSIS — N3281 Overactive bladder: Secondary | ICD-10-CM

## 2024-03-08 LAB — URINALYSIS, COMPLETE
Bilirubin, UA: NEGATIVE
Glucose, UA: NEGATIVE
Ketones, UA: NEGATIVE
Leukocytes,UA: NEGATIVE
Nitrite, UA: NEGATIVE
Protein,UA: NEGATIVE
Specific Gravity, UA: 1.015 (ref 1.005–1.030)
Urobilinogen, Ur: 0.2 mg/dL (ref 0.2–1.0)
pH, UA: 6.5 (ref 5.0–7.5)

## 2024-03-08 LAB — MICROSCOPIC EXAMINATION: Bacteria, UA: NONE SEEN

## 2024-03-08 LAB — BLADDER SCAN AMB NON-IMAGING

## 2024-03-08 MED ORDER — MIRABEGRON ER 50 MG PO TB24
50.0000 mg | ORAL_TABLET | Freq: Every day | ORAL | 11 refills | Status: AC
Start: 2024-03-08 — End: ?

## 2024-03-08 NOTE — Progress Notes (Signed)
 03/08/2024 10:48 AM   Jane [name] Jane [name] 07-May-1966 [id number]  CC: Chief Complaint  Patient presents with   Over Active Bladder   HPI: Jane [name] is a 58 y.o. female with PMH OAB wet previously on Gemtesa , UTIs on vaginal estrogen cream and suppressive Macrobid , OSA on CPAP, and microscopic hematuria with prior benign workup who presents today for evaluation of worsening urinary incontinence.   She had to stop Gemtesa  earlier this year due to lack of insurance coverage.  She was started on oxybutynin  XL 10 mg as an alternative.  Today she reports oxybutynin  is not working.  She is having bothersome dry mouth and constipation and feels she has no urinary control with significant urinary leakage especially overnight.  In-office UA today positive for 1+ blood; urine microscopy with 3-10 RBCs/HPF. PVR 1mL.  PMH: Past Medical History:  Diagnosis Date   ADD (attention deficit disorder)    Allergy    Anxiety    Arthritis 2021   Back pain    Bilateral swelling of feet    Bipolar 1 disorder (HCC)    Bipolar 1 disorder (HCC)    Bipolar disorder (HCC)    Blood transfusion without reported diagnosis 2020   Chewing difficulty    Chronic fatigue syndrome    Chronic kidney disease    Stage 3 kidney disease;dx by Dr. Mariea Shook.    Clotting disorder (HCC)    I'm on a blood  thinner   Constipation    Depression 2000   Diabetes mellitus without complication (HCC)    diet controlled   Dyspnea    with exertion   GERD (gastroesophageal reflux disease)    Headache    migraines   High cholesterol    History of blood clots    History of DVT (deep vein thrombosis)    left leg   Hypertension    states under control with meds., has been on med. x 2 years   Hypothyroidism    Joint pain    Neuromuscular disorder (HCC)    Neuropathy    Obsessive-compulsive disorder    Peripheral vascular disease (HCC)    Prediabetes    Respiratory failure requiring intubation (HCC)     Restless leg syndrome    Shortness of breath    Sleep apnea    Thrombocytopenia (HCC) 09/15/2019   Trigger thumb of left hand 01/2018   Trigger thumb of right hand     Surgical History: Past Surgical History:  Procedure Laterality Date   ABDOMINAL HYSTERECTOMY  06/2016   complete   APPLICATION OF WOUND VAC Left 04/27/2019   Procedure: APPLICATION OF WOUND VAC LEFT GROIN;  Surgeon: Adine Hoof, MD;  Location: [facility] OR;  Service: Vascular;  Laterality: Left;   AV FISTULA PLACEMENT Left 11/24/2018   Procedure: ARTERIOVENOUS (AV) FISTULA CREATION LEFT SFA TO LEFT FEMORAL VEIN;  Surgeon: Adine Hoof, MD;  Location: [facility] OR;  Service: Vascular;  Laterality: Left;   BACK SURGERY     CHOLECYSTECTOMY     COLONOSCOPY WITH PROPOFOL  N/A 11/22/2019   Procedure: COLONOSCOPY WITH PROPOFOL ;  Surgeon: Luke Salaam, MD;  Location: [facility];  Service: Gastroenterology;  Laterality: N/A;   COLONOSCOPY WITH PROPOFOL  N/A 12/09/2022   Procedure: COLONOSCOPY WITH PROPOFOL ;  Surgeon: Luke Salaam, MD;  Location: [facility]COPY;  Service: Gastroenterology;  Laterality: N/A;   FEMORAL ARTERY EXPLORATION  03/30/2019   Procedure: Left Common Femoral Artery and Vein Exploration;  Surgeon: Adine Hoof, MD;  Location: [facility] OR;  Service: Vascular;;   FEMORAL-FEMORAL BYPASS GRAFT Left 11/24/2018   Procedure: BYPASS GRAFT FEMORAL-FEMORAL VENOUS LEFT TO RIGHT PALMA PROCEDURE USING CRYOVEIN;  Surgeon: Adine Hoof, MD;  Location: [facility] OR;  Service: Vascular;  Laterality: Left;   GROIN DEBRIDEMENT Left 04/27/2019   Procedure: GROIN DEBRIDEMENT;  Surgeon: Adine Hoof, MD;  Location: [facility] OR;  Service: Vascular;  Laterality: Left;   INSERTION OF ILIAC STENT  03/30/2019   Procedure: Stent of left common, external iliac veins and left common femoral vein;  Surgeon: Adine Hoof, MD;  Location: [facility] OR;  Service: Vascular;;   KNEE ARTHROSCOPY WITH MENISCAL  REPAIR Left 11/14/2020   Procedure: LEFT KNEE ARTHROSCOPY WITH PARTIAL MEDIAL MENISCECTOMY;  Surgeon: Darrin Emerald, MD;  Location: [facility];  Service: Orthopedics;  Laterality: Left;   LOWER EXTREMITY VENOGRAPHY N/A 08/17/2018   Procedure: LOWER EXTREMITY VENOGRAPHY - Central Venogram;  Surgeon: Adine Hoof, MD;  Location: [facility] INVASIVE CV LAB;  Service: Cardiovascular;  Laterality: N/A;   LOWER EXTREMITY VENOGRAPHY Bilateral 03/09/2019   Procedure: LOWER EXTREMITY VENOGRAPHY;  Surgeon: Adine Hoof, MD;  Location: [facility] INVASIVE CV LAB;  Service: Cardiovascular;  Laterality: Bilateral;   LOWER EXTREMITY VENOGRAPHY Left 08/16/2019   Procedure: LOWER EXTREMITY VENOGRAPHY;  Surgeon: Adine Hoof, MD;  Location: [facility] INVASIVE CV LAB;  Service: Cardiovascular;  Laterality: Left;   LOWER EXTREMITY VENOGRAPHY Left 03/25/2022   Procedure: LOWER EXTREMITY VENOGRAPHY;  Surgeon: Adine Hoof, MD;  Location: [facility] INVASIVE CV LAB;  Service: Cardiovascular;  Laterality: Left;  IVUS   LUMBAR FUSION  11/21/2000   L5-S1   LUMBAR SPINE SURGERY     x 2 others   PATCH ANGIOPLASTY Left 03/30/2019   Procedure: Patch Angioplasty of the Left Common Femoral Vein using Venosure Biologic patch;  Surgeon: Adine Hoof, MD;  Location: [facility] OR;  Service: Vascular;  Laterality: Left;   PERIPHERAL VASCULAR INTERVENTION Left 08/16/2019   Procedure: PERIPHERAL VASCULAR INTERVENTION;  Surgeon: Adine Hoof, MD;  Location: [facility] INVASIVE CV LAB;  Service: Cardiovascular;  Laterality: Left;  common femoral/femoral vein stent   PERIPHERAL VASCULAR INTERVENTION Left 03/25/2022   Procedure: PERIPHERAL VASCULAR INTERVENTION;  Surgeon: Adine Hoof, MD;  Location: [facility] INVASIVE CV LAB;  Service: Cardiovascular;  Laterality: Left;  COMMON FEMORAL VEIN   PERIPHERAL VASCULAR THROMBECTOMY Left 03/25/2022   Procedure: PERIPHERAL VASCULAR THROMBECTOMY;  Surgeon:  Adine Hoof, MD;  Location: [facility] INVASIVE CV LAB;  Service: Cardiovascular;  Laterality: Left;  EXTREMITY/IVC   SPINE SURGERY     TRIGGER FINGER RELEASE Right 12/01/2017   Procedure: RELEASE TRIGGER FINGER/A-1 PULLEY RIGHT THUMB;  Surgeon: Brunilda Capra, MD;  Location: [facility];  Service: Orthopedics;  Laterality: Right;   TRIGGER FINGER RELEASE Left 01/26/2018   Procedure: LEFT TRIGGER THUMB RELEASE;  Surgeon: Brunilda Capra, MD;  Location: [facility];  Service: Orthopedics;  Laterality: Left;   ULTRASOUND GUIDANCE FOR VASCULAR ACCESS Right 03/30/2019   Procedure: Ultrasound-guided cannulation right internal jugular vein;  Surgeon: Adine Hoof, MD;  Location: [facility] OR;  Service: Vascular;  Laterality: Right;    Home Medications:  Allergies as of 03/08/2024       Reactions   Aripiprazole Other (See Comments)   BECOMES  VIOLENT   Seroquel  [quetiapine  Fumarate] Other (See Comments)   BECOMES VIOLENT   Chlorpromazine  Other (See Comments)   SEVERE ANXIETY   Gabapentin  Other (See Comments)   NIGHTMARES   Prednisone  Hives   Quetiapine   Medication List        Accurate as of Mar 08, 2024 10:48 AM. If you have any questions, ask your nurse or doctor.          Ajovy 225 MG/1.5ML Soaj Generic drug: Fremanezumab-vfrm Inject 1 mL into the skin every 30 (thirty) days.   ALPRAZolam  1 MG tablet Commonly known as: XANAX  Take 1 tablet (1 mg total) by mouth 2 (two) times daily as needed. for anxiety   amphetamine -dextroamphetamine  30 MG tablet Commonly known as: Adderall Take 1 tablet by mouth 2 (two) times daily.   amphetamine -dextroamphetamine  30 MG tablet Commonly known as: Adderall Take 1 tablet by mouth 2 (two) times daily.   amphetamine -dextroamphetamine  30 MG tablet Commonly known as: ADDERALL Take 1 tablet by mouth 2 (two) times daily.   amphetamine -dextroamphetamine  30 MG tablet Commonly known as: ADDERALL Take  1 tablet by mouth 2 (two) times daily.   aspirin  EC 81 MG tablet Take 81 mg by mouth daily.   atorvastatin  40 MG tablet Commonly known as: LIPITOR Take 1 tablet (40 mg total) by mouth daily.   cetirizine  10 MG tablet Commonly known as: ZYRTEC  Take 1 tablet (10 mg total) by mouth daily.   chlorhexidine  0.12 % solution Commonly known as: PERIDEX  Use as directed 5 mLs in the mouth or throat 4 (four) times daily.   cromolyn 4 % ophthalmic solution Commonly known as: OPTICROM Place 1 drop into both eyes 2 (two) times daily.   estradiol  0.1 MG/GM vaginal cream Commonly known as: ESTRACE  VAGINAL Apply 0.5mg  (pea-sized amount)  just inside the vaginal introitus with a finger-tip on Monday, Wednesday and Friday nights.   famotidine  20 MG tablet Commonly known as: PEPCID  Take 20 mg by mouth daily.   FLUoxetine  40 MG capsule Commonly known as: PROZAC  Take 1 capsule (40 mg total) by mouth 2 (two) times daily.   GARLIC PO Take 1 tablet by mouth daily.   Gemtesa  75 MG Tabs Generic drug: Vibegron  Take 1 tablet (75 mg total) by mouth daily.   HYDROcodone -acetaminophen  10-325 MG tablet Commonly known as: NORCO Take 1 tablet by mouth every 6 (six) hours as needed. To last 30 days from fill date   ketoconazole  2 % cream Commonly known as: NIZORAL  Apply to affected areas rash in body folds once to twice daily until improved. May use daily as a preventative.   levothyroxine  125 MCG tablet Commonly known as: SYNTHROID  Take 1 tablet (125 mcg total) by mouth daily before breakfast.   Linzess  290 MCG Caps capsule Generic drug: linaclotide  TAKE ONE CAPSULE BY MOUTH DAILY   lisinopril  10 MG tablet Commonly known as: ZESTRIL  Take 1 tablet (10 mg total) by mouth daily.   methocarbamol  750 MG tablet Commonly known as: ROBAXIN  Take 1 tablet (750 mg total) by mouth 2 (two) times daily as needed for muscle spasms.   methylPREDNISolone  4 MG Tbpk tablet Commonly known as: MEDROL   DOSEPAK Take 6 pills on day 1, 5 pills on day 2, 4 pills on day 3, 3 pills on day 4, 2 pills on day 5, 1 pill on day 6   multivitamin capsule Take 1 capsule by mouth daily.   mupirocin  ointment 2 % Commonly known as: BACTROBAN  Apply to open areas/sores twice daily until healed.   nitrofurantoin  100 MG capsule Commonly known as: Macrodantin  Take 1 capsule (100 mg total) by mouth daily.   omega-3 acid ethyl esters 1 g capsule Commonly known as: LOVAZA Take by mouth.   oxybutynin   10 MG 24 hr tablet Commonly known as: Ditropan  XL Take 1 tablet (10 mg total) by mouth daily.   Ozempic  (1 MG/DOSE) 4 MG/3ML Sopn Generic drug: Semaglutide  (1 MG/DOSE) Inject 1 mg as directed once a week.   pantoprazole  40 MG tablet Commonly known as: PROTONIX  TAKE 1 TABLET BY MOUTH DAILY   pimecrolimus  1 % cream Commonly known as: ELIDEL  Apply to affected areas rash in body folds and legs once to twice daily until improved.   polyethylene glycol powder 17 GM/SCOOP powder Commonly known as: GLYCOLAX /MIRALAX  TAKE 17 Gram BY MOUTH DAILY   potassium chloride  10 MEQ tablet Commonly known as: KLOR-CON  M Take 1 tablet (10 mEq total) by mouth 3 (three) times daily.   pregabalin  100 MG capsule Commonly known as: LYRICA  TAKE ONE CAPSULE BY MOUTH FOUR TIMES DAILY   Restasis  0.05 % ophthalmic emulsion Generic drug: cycloSPORINE  Place 1 drop into both eyes 2 (two) times daily.   rizatriptan 10 MG disintegrating tablet Commonly known as: MAXALT-MLT Take 10 mg by mouth as needed for migraine.   rOPINIRole  2 MG tablet Commonly known as: REQUIP  TAKE 1 TABLET BY MOUTH TWICE DAILY   traZODone  150 MG tablet Commonly known as: DESYREL  Take 1 tablet (150 mg total) by mouth at bedtime.   valACYclovir  1000 MG tablet Commonly known as: VALTREX  Take 2 tablets by mouth at first onset of fever blisters, then take 2 tablets by mouth 12 hours later.   warfarin 5 MG tablet Commonly known as: COUMADIN  Take  as directed by the anticoagulation clinic. If you are unsure how to take this medication, talk to your nurse or doctor. Original instructions: Take 7.5 mg daily except 5mg  on Saturday and Sunday.   warfarin 1 MG tablet Commonly known as: COUMADIN  Take as directed by the anticoagulation clinic. If you are unsure how to take this medication, talk to your nurse or doctor. Original instructions: Take one  along with 5 mg pill daily except for Mondays take 2 pills with 5 mg pill.  Equal to 6 mg daily except 7 mg on Monday.        Allergies:  Allergies  Allergen Reactions   Aripiprazole Other (See Comments)    BECOMES  VIOLENT    Seroquel  [Quetiapine  Fumarate] Other (See Comments)    BECOMES VIOLENT   Chlorpromazine  Other (See Comments)    SEVERE ANXIETY    Gabapentin  Other (See Comments)    NIGHTMARES    Prednisone  Hives   Quetiapine      Family History: Family History  Problem Relation Age of Onset   Heart disease Mother    Hyperlipidemia Mother    Hypertension Mother    Bipolar disorder Mother    Stroke Mother    Depression Mother    Sleep apnea Mother    Obesity Mother    Diabetes Father    Heart disease Father    Hyperlipidemia Father    Hypertension Father    Sleep apnea Father    Obesity Father    Drug abuse Daughter    ADD / ADHD Daughter    Drug abuse Daughter    Anxiety disorder Daughter    Bipolar disorder Daughter    Hypertension Sister    Hypertension Brother    Hyperlipidemia Brother    Heart disease Brother    Early death Brother    Kidney disease Brother    Bipolar disorder Maternal Aunt    Suicidality Maternal Aunt     Social History:  reports that she has never smoked. She has never used smokeless tobacco. She reports that she does not drink alcohol and does not use drugs.  Physical Exam: LMP  (LMP Unknown)   Constitutional:  Alert and oriented, no acute distress, nontoxic appearing HEENT: [place], AT Cardiovascular: No clubbing, cyanosis,  or edema Respiratory: Normal respiratory effort, no increased work of breathing Skin: No rashes, bruises or suspicious lesions Neurologic: Grossly intact, no focal deficits, moving all 4 extremities Psychiatric: Normal mood and affect  Laboratory Data: Results for orders placed or performed in visit on 03/08/24  Microscopic Examination   Collection Time: 03/08/24 10:15 AM   Urine  Result Value Ref Range   WBC, UA WILL FOLLOW    RBC, Urine WILL FOLLOW    Epithelial Cells (non renal) WILL FOLLOW    Renal Epithel, UA WILL FOLLOW    Casts WILL FOLLOW    Cast Type WILL FOLLOW    Crystals WILL FOLLOW    Crystal Type WILL FOLLOW    Mucus, UA WILL FOLLOW    Bacteria, UA WILL FOLLOW    Yeast, UA WILL FOLLOW    Trichomonas, UA WILL FOLLOW    Urinalysis Comments WILL FOLLOW   Urinalysis, Complete   Collection Time: 03/08/24 10:15 AM  Result Value Ref Range   Specific Gravity, UA 1.015 1.005 - 1.030   pH, UA 6.5 5.0 - 7.5   Color, UA Yellow Yellow   Appearance Ur Clear Clear   Leukocytes,UA Negative Negative   Protein,UA Negative Negative/Trace   Glucose, UA Negative Negative   Ketones, UA Negative Negative   RBC, UA 1+ (A) Negative   Bilirubin, UA Negative Negative   Urobilinogen, Ur 0.2 0.2 - 1.0 mg/dL   Nitrite, UA Negative Negative   Microscopic Examination See below:   BLADDER SCAN AMB NON-IMAGING   Collection Time: 03/08/24 10:49 AM  Result Value Ref Range   Scan Result 1ml    *Note: Due to a large number of results and/or encounters for the requested time period, some results have not been displayed. A complete set of results can be found in Results Review.   Assessment & Plan:   1. OAB (overactive bladder) (Primary) UA bland, emptying appropriately.  She failed Gemtesa  due to cost, failed oxybutynin  due to anticholinergic side effects and inefficacy.  Will put her back on Myrbetriq 50 mg and have her follow-up with Dr. Clarke Crouch in 6 weeks for symptom recheck and  PVR. - Urinalysis, Complete - BLADDER SCAN AMB NON-IMAGING - mirabegron ER (MYRBETRIQ) 50 MG TB24 tablet; Take 1 tablet (50 mg total) by mouth daily.  Dispense: 30 tablet; Refill: 11   Return in about 6 weeks (around 04/19/2024) for Symptom recheck with PVR.  Kathreen Pare, PA-C  [facility] [place] [address] [place], [place] [address] [phone]

## 2024-03-09 ENCOUNTER — Encounter (INDEPENDENT_AMBULATORY_CARE_PROVIDER_SITE_OTHER): Payer: Self-pay | Admitting: Family Medicine

## 2024-03-09 ENCOUNTER — Ambulatory Visit (INDEPENDENT_AMBULATORY_CARE_PROVIDER_SITE_OTHER): Payer: MEDICAID | Admitting: Family Medicine

## 2024-03-09 VITALS — BP 98/56 | HR 55 | Temp 97.9°F | Ht 68.0 in | Wt 232.0 lb

## 2024-03-09 DIAGNOSIS — Z6835 Body mass index (BMI) 35.0-35.9, adult: Secondary | ICD-10-CM

## 2024-03-09 DIAGNOSIS — Z7985 Long-term (current) use of injectable non-insulin antidiabetic drugs: Secondary | ICD-10-CM

## 2024-03-09 DIAGNOSIS — E1159 Type 2 diabetes mellitus with other circulatory complications: Secondary | ICD-10-CM | POA: Diagnosis not present

## 2024-03-09 DIAGNOSIS — I152 Hypertension secondary to endocrine disorders: Secondary | ICD-10-CM

## 2024-03-09 NOTE — Progress Notes (Signed)
 SUBJECTIVE:  Chief Complaint: Obesity  Interim History: Patient has been doing water aerobics twice a week for about an hour.  She also just ordered a tricycle and will start biking.  She is trying to get a significant amount of walking in daily.  She has cut back on her carbohydrate consumption.  Intake is normally yogurt in the am or occasionally skipping breakfast.  Yesterday she had a cheeseburger happy meal for lunch, 3pm she heated up broccoli, then had salmon and homemade macaroni and cheese and two fun sized chocolate candies.  She also had a banana for a snack.   Jane [name] is here to discuss her progress with her obesity treatment plan. She is on the Category 3 Plan and states she is following her eating plan approximately 75 % of the time. She states she is exercising 60 minutes 1 time per week and walking all day.   OBJECTIVE: Visit Diagnoses: Problem List Items Addressed This Visit       Cardiovascular and Mediastinum   Hypertension associated with diabetes (HCC) - Primary (Chronic)   Blood pressure low normal today.  No chest pain, chest pressure.  Some fatigue but no lightheadness or dizziness.  Follow up on BP at next appointment.  Continue current medications        Other   Morbid obesity (HCC)   Other Visit Diagnoses       BMI 35.0-35.9,adult           Vitals Temp: 97.9 F (36.6 C) BP: (!) 98/56 Pulse Rate: (!) 55 SpO2: 95 %   Anthropometric Measurements Height: 5\' 8"  (1.727 m) Weight: 232 lb (105.2 kg) BMI (Calculated): 35.28 Weight at Last Visit: 231 lb Weight Lost Since Last Visit: 0 Weight Gained Since Last Visit: 1 Starting Weight: 258 lb Total Weight Loss (lbs): 26 lb (11.8 kg)   Body Composition  Body Fat %: 46.9 % Fat Mass (lbs): 109.2 lbs Muscle Mass (lbs): 117.4 lbs Total Body Water (lbs): 86.6 lbs Visceral Fat Rating : 13   Other Clinical Data Fasting: no Labs: no Today's Visit #: 48 Starting Date: 08/29/20 Comments: Cat  3     ASSESSMENT AND PLAN:  Diet: Sharla is currently in the action stage of change. As such, her goal is to continue with weight loss efforts and has agreed to keeping a food journal and adhering to recommended goals of 1450-1600 calories and 95 or more grams of protein daily. Patient to start food log or journaling meal plan.  The initial goal will be to habitually log or journal for at least 4 days a week.  The expectation it that patient may not initially meet calorie or protein goals as the nturitional understanding of food intake is begun.  We discussed the 10:1 ratio when reading a food label.  Patient agrees to keep a food log either electronically or on paper and bring to the next appointment to be able to dissect and discuss it with provider. Helped patient set up My Fitness pal with goal calories and protein   Exercise:  For substantial health benefits, adults should do at least 150 minutes (2 hours and 30 minutes) a week of moderate-intensity, or 75 minutes (1 hour and 15 minutes) a week of vigorous-intensity aerobic physical activity, or an equivalent combination of moderate- and vigorous-intensity aerobic activity. Aerobic activity should be performed in episodes of at least 10 minutes, and preferably, it should be spread throughout the week.  Behavior Modification:  We discussed the  following Behavioral Modification Strategies today: increasing lean protein intake, decreasing simple carbohydrates, increasing vegetables, meal planning and cooking strategies, keeping healthy foods in the home, planning for success, and keep a strict food journal.   Return in about 6 weeks (around 04/20/2024).   She was informed of the importance of frequent follow up visits to maximize her success with intensive lifestyle modifications for her multiple health conditions.  Attestation Statements:   Reviewed by clinician on day of visit: allergies, medications, problem list, medical history, surgical  history, family history, social history, and previous encounter notes.     Donaciano Frizzle, MD

## 2024-03-09 NOTE — Assessment & Plan Note (Signed)
 Blood pressure low normal today.  No chest pain, chest pressure.  Some fatigue but no lightheadness or dizziness.  Follow up on BP at next appointment.  Continue current medications

## 2024-03-09 NOTE — Progress Notes (Unsigned)
 [facility] [address], [place] [address]   CLINIC:  Medical Oncology/Hematology  PCP:  Mazie Speed, MD [address] [place] [address] [phone]   REASON FOR VISIT:  Follow-up for iron deficiency anemia   CURRENT THERAPY: Intermittent iron infusions (most recent Feraheme  on 10/31/2022)  INTERVAL HISTORY:  [name] is contacted today for follow-up of her iron deficiency anemia.  She was last evaluated via telemedicine visit by Sheril Dines PA-C on 03/03/2023.   At today's visit, she reports feeling fair.  She denies any major changes in her health since her last visit.   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  She was most recently seen by Dr. Vikki Graves on 02/25/2024.  She continues to take Coumadin  as prescribed and has her INR checked regularly by her PCP.    She has ongoing fatigue and hypersomnolence, and has been instructed to return her CPAP machine due to not meeting insurance requirements for use.  She is considering implantable device for treatment of OSA.  She has not noticed any recent bleeding such as bright red blood per rectum, melena, or epistaxis. She reports chronic intermittent hematuria, following with urology.  She has ongoing intermittent palpitations, but cardiology workup was negative.  She denies any pica, chest pain, dyspnea on exertion, syncope, or lightheadedness.   She has little to no energy and 75% appetite.  Her weight is stable.  ASSESSMENT & PLAN:  1.  Normocytic anemia: - Combination anemia from CKD and malabsorption-related iron deficiency. - Colonoscopy on 11/22/2019 shows 3 small polyps in the ascending colon and the cecum.  Nonbleeding internal hemorrhoids.  Pathology consistent with tubular adenoma. - Failed oral iron supplementation: She has taken iron pills in the past without  improvement. - Last IV iron infusion (Feraheme  x1) on 10/31/2022 - She had a history of blood transfusions in the past when she had her hysterectomy, and also required transfusion after large intraoperative blood loss (3.6 L) during vascular surgery in May 2020 - Denies any current signs or symptoms of blood loss, no melena or bright red blood per rectum.   Scant hematuria has been worked up by urology. - She continues to have chronic fatigue - Most recent labs (02/27/2024): Hgb 12.9/MCV 95.5, ferritin 124, iron saturation 25%.  She has CKD stage IIIa (creatinine 1.2/GFR 51) - PLAN: No indication for IV iron at this time.   - RTC in 1 year  2.  Thrombocytopenia, mild - Mild intermittent thrombocytopenia since May 2020, with platelets ranging from 117 to normal - Negative HIV test (June 2020).  Negative hepatitis C test (January 2023).  Hepatitis B negative (April 2024). - CT venogram of abdomen/pelvis (02/12/2024) showed normal spleen and liver without any focal abnormality - Hematology workup (April 2024): Normal B12, copper , folate.  MMA normal  Normal SPEP and immunofixation Immature platelet fraction normal at 3.0% Negative RF and ANA. - Most recent CBC/differential (02/24/2023) with platelets 137 - She denies any abnormal bleeding or bruising.   - PLAN: We will continue to monitor and consider additional workup if any major deviations from baseline or development of any other cytopenias    3.  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 - Initial DVT was in the left leg in 2014. - Breakthrough/recurrent DVT while on Xarelto  - Coagulopathy work-up (  November 2020) negative for lupus anticoagulant, anticardiolipin antibodies, or beta-2 glycoprotein antibodies. - She follows with Dr. Vikki Graves at [facility] in [place] - She is currently on Coumadin , also follows with the Coumadin   clinic for INR checks and dose adjustments. - PLAN: Continue warfarin and follow-up with vascular specialists  PLAN SUMMARY: >> Labs in 1 year = CBC/D, ferritin, iron/TIBC, CMP, LDH >> OFFICE visit in 1 year      REVIEW OF SYSTEMS:   Review of Systems  Constitutional:  Positive for fatigue. Negative for appetite change, chills, diaphoresis, fever and unexpected weight change.  HENT:   Negative for lump/mass and nosebleeds.   Eyes:  Negative for eye problems.  Respiratory:  Negative for cough, hemoptysis and shortness of breath.   Cardiovascular:  Positive for leg swelling. Negative for chest pain and palpitations.  Gastrointestinal:  Positive for constipation. Negative for abdominal pain, blood in stool, diarrhea, nausea and vomiting.  Genitourinary:  Negative for hematuria.   Musculoskeletal:  Positive for arthralgias and back pain.  Skin: Negative.   Neurological:  Positive for dizziness, headaches and numbness. Negative for light-headedness.  Hematological:  Does not bruise/bleed easily.  Psychiatric/Behavioral:  Positive for sleep disturbance.      PHYSICAL EXAM:  ECOG PERFORMANCE STATUS: 1 - Symptomatic but completely ambulatory  Vitals:   03/10/24 0846  BP: (!) 108/59  Pulse: (!) 56  Resp: 17  Temp: (!) 97.5 F (36.4 C)  SpO2: 95%   Filed Weights   03/10/24 0846  Weight: 233 lb (105.7 kg)   Physical Exam Constitutional:      Appearance: Normal appearance. She is obese.  Cardiovascular:     Heart sounds: Normal heart sounds.  Pulmonary:     Breath sounds: Normal breath sounds.  Neurological:     General: No focal deficit present.     Mental Status: Mental status is at baseline.  Psychiatric:        Behavior: Behavior normal. Behavior is cooperative.    PAST MEDICAL/SURGICAL HISTORY:  Past Medical History:  Diagnosis Date   ADD (attention deficit disorder)    Allergy    Anxiety    Arthritis 2021   Back pain    Bilateral swelling of feet    Bipolar 1  disorder (HCC)    Bipolar 1 disorder (HCC)    Bipolar disorder (HCC)    Blood transfusion without reported diagnosis 2020   Chewing difficulty    Chronic fatigue syndrome    Chronic kidney disease    Stage 3 kidney disease;dx by Dr. Mariea Shook.    Clotting disorder (HCC)    I'm on a blood  thinner   Constipation    Depression 2000   Diabetes mellitus without complication (HCC)    diet controlled   Dyspnea    with exertion   GERD (gastroesophageal reflux disease)    Headache    migraines   High cholesterol    History of blood clots    History of DVT (deep vein thrombosis)    left leg   Hypertension    states under control with meds., has been on med. x 2 years   Hypothyroidism    Joint pain    Neuromuscular disorder (HCC)    Neuropathy    Obsessive-compulsive disorder    Peripheral vascular disease (HCC)    Prediabetes    Respiratory failure requiring intubation (HCC)    Restless leg syndrome    Shortness of breath    Sleep apnea  Thrombocytopenia (HCC) 09/15/2019   Trigger thumb of left hand 01/2018   Trigger thumb of right hand    Past Surgical History:  Procedure Laterality Date   ABDOMINAL HYSTERECTOMY  06/2016   complete   APPLICATION OF WOUND VAC Left 04/27/2019   Procedure: APPLICATION OF WOUND VAC LEFT GROIN;  Surgeon: Adine Hoof, MD;  Location: [facility] OR;  Service: Vascular;  Laterality: Left;   AV FISTULA PLACEMENT Left 11/24/2018   Procedure: ARTERIOVENOUS (AV) FISTULA CREATION LEFT SFA TO LEFT FEMORAL VEIN;  Surgeon: Adine Hoof, MD;  Location: [facility] OR;  Service: Vascular;  Laterality: Left;   BACK SURGERY     CHOLECYSTECTOMY     COLONOSCOPY WITH PROPOFOL  N/A 11/22/2019   Procedure: COLONOSCOPY WITH PROPOFOL ;  Surgeon: Luke Salaam, MD;  Location: [facility] ENDOSCOPY;  Service: Gastroenterology;  Laterality: N/A;   COLONOSCOPY WITH PROPOFOL  N/A 12/09/2022   Procedure: COLONOSCOPY WITH PROPOFOL ;  Surgeon: Luke Salaam, MD;   Location: [facility] ENDOSCOPY;  Service: Gastroenterology;  Laterality: N/A;   FEMORAL ARTERY EXPLORATION  03/30/2019   Procedure: Left Common Femoral Artery and Vein Exploration;  Surgeon: Adine Hoof, MD;  Location: [facility] OR;  Service: Vascular;;   FEMORAL-FEMORAL BYPASS GRAFT Left 11/24/2018   Procedure: BYPASS GRAFT FEMORAL-FEMORAL VENOUS LEFT TO RIGHT PALMA PROCEDURE USING CRYOVEIN;  Surgeon: Adine Hoof, MD;  Location: [facility] OR;  Service: Vascular;  Laterality: Left;   GROIN DEBRIDEMENT Left 04/27/2019   Procedure: Arnetha Bhat DEBRIDEMENT;  Surgeon: Adine Hoof, MD;  Location: [facility] OR;  Service: Vascular;  Laterality: Left;   INSERTION OF ILIAC STENT  03/30/2019   Procedure: Stent of left common, external iliac veins and left common femoral vein;  Surgeon: Adine Hoof, MD;  Location: [facility] OR;  Service: Vascular;;   KNEE ARTHROSCOPY WITH MENISCAL REPAIR Left 11/14/2020   Procedure: LEFT KNEE ARTHROSCOPY WITH PARTIAL MEDIAL MENISCECTOMY;  Surgeon: Darrin Emerald, MD;  Location: [facility] ORS;  Service: Orthopedics;  Laterality: Left;   LOWER EXTREMITY VENOGRAPHY N/A 08/17/2018   Procedure: LOWER EXTREMITY VENOGRAPHY - Central Venogram;  Surgeon: Adine Hoof, MD;  Location: [facility] INVASIVE CV LAB;  Service: Cardiovascular;  Laterality: N/A;   LOWER EXTREMITY VENOGRAPHY Bilateral 03/09/2019   Procedure: LOWER EXTREMITY VENOGRAPHY;  Surgeon: Adine Hoof, MD;  Location: [facility] INVASIVE CV LAB;  Service: Cardiovascular;  Laterality: Bilateral;   LOWER EXTREMITY VENOGRAPHY Left 08/16/2019   Procedure: LOWER EXTREMITY VENOGRAPHY;  Surgeon: Adine Hoof, MD;  Location: [facility] INVASIVE CV LAB;  Service: Cardiovascular;  Laterality: Left;   LOWER EXTREMITY VENOGRAPHY Left 03/25/2022   Procedure: LOWER EXTREMITY VENOGRAPHY;  Surgeon: Adine Hoof, MD;  Location: [facility] INVASIVE CV LAB;  Service: Cardiovascular;  Laterality: Left;  IVUS    LUMBAR FUSION  11/21/2000   L5-S1   LUMBAR SPINE SURGERY     x 2 others   PATCH ANGIOPLASTY Left 03/30/2019   Procedure: Patch Angioplasty of the Left Common Femoral Vein using Venosure Biologic patch;  Surgeon: Adine Hoof, MD;  Location: [facility] OR;  Service: Vascular;  Laterality: Left;   PERIPHERAL VASCULAR INTERVENTION Left 08/16/2019   Procedure: PERIPHERAL VASCULAR INTERVENTION;  Surgeon: Adine Hoof, MD;  Location: [facility] INVASIVE CV LAB;  Service: Cardiovascular;  Laterality: Left;  common femoral/femoral vein stent   PERIPHERAL VASCULAR INTERVENTION Left 03/25/2022   Procedure: PERIPHERAL VASCULAR INTERVENTION;  Surgeon: Adine Hoof, MD;  Location: [facility] INVASIVE CV LAB;  Service: Cardiovascular;  Laterality: Left;  COMMON FEMORAL VEIN  PERIPHERAL VASCULAR THROMBECTOMY Left 03/25/2022   Procedure: PERIPHERAL VASCULAR THROMBECTOMY;  Surgeon: Adine Hoof, MD;  Location: [facility] INVASIVE CV LAB;  Service: Cardiovascular;  Laterality: Left;  EXTREMITY/IVC   SPINE SURGERY     TRIGGER FINGER RELEASE Right 12/01/2017   Procedure: RELEASE TRIGGER FINGER/A-1 PULLEY RIGHT THUMB;  Surgeon: Brunilda Capra, MD;  Location: [facility];  Service: Orthopedics;  Laterality: Right;   TRIGGER FINGER RELEASE Left 01/26/2018   Procedure: LEFT TRIGGER THUMB RELEASE;  Surgeon: Brunilda Capra, MD;  Location: [facility];  Service: Orthopedics;  Laterality: Left;   ULTRASOUND GUIDANCE FOR VASCULAR ACCESS Right 03/30/2019   Procedure: Ultrasound-guided cannulation right internal jugular vein;  Surgeon: Adine Hoof, MD;  Location: [facility] OR;  Service: Vascular;  Laterality: Right;    SOCIAL HISTORY:  Social History   Socioeconomic History   Marital status: Divorced    Spouse name: Not on file   Number of children: 3   Years of education: Not on file   Highest education level: GED or equivalent  Occupational History   Not on  file  Tobacco Use   Smoking status: Never   Smokeless tobacco: Never  Vaping Use   Vaping status: Never Used  Substance and Sexual Activity   Alcohol use: Never   Drug use: Never   Sexual activity: Not Currently    Partners: Male    Birth control/protection: Surgical  Other Topics Concern   Not on file  Social History Narrative   Not on file   Social Drivers of Health   Financial Resource Strain: Medium Risk (11/28/2023)   Overall Financial Resource Strain (CARDIA)    Difficulty of Paying Living Expenses: Somewhat hard  Food Insecurity: Food Insecurity Present (11/28/2023)   Hunger Vital Sign    Worried About Running Out of Food in the Last Year: Often true    Ran Out of Food in the Last Year: Often true  Transportation Needs: Unmet Transportation Needs (11/28/2023)   PRAPARE - Transportation    Lack of Transportation (Medical): Yes    Lack of Transportation (Non-Medical): Yes  Physical Activity: Sufficiently Active (11/28/2023)   Exercise Vital Sign    Days of Exercise per Week: 5 days    Minutes of Exercise per Session: 30 min  Stress: Stress Concern Present (11/28/2023)   [employer] of Occupational Health - Occupational Stress Questionnaire    Feeling of Stress : To some extent  Social Connections: Socially Isolated (11/28/2023)   Social Connection and Isolation Panel [NHANES]    Frequency of Communication with Friends and Family: More than three times a week    Frequency of Social Gatherings with Friends and Family: Once a week    Attends Religious Services: Never    Database administrator or Organizations: No    Attends Engineer, structural: Not on file    Marital Status: Divorced  Intimate Partner Violence: Unknown (02/07/2022)   Received from [employer], [facility]   HITS    Physically Hurt: Not on file    Insult or Talk Down To: Not on file    Threaten Physical Harm: Not on file    Scream or Curse: Not on file    FAMILY HISTORY:  Family  History  Problem Relation Age of Onset   Heart disease Mother    Hyperlipidemia Mother    Hypertension Mother    Bipolar disorder Mother    Stroke Mother    Depression Mother  Sleep apnea Mother    Obesity Mother    Diabetes Father    Heart disease Father    Hyperlipidemia Father    Hypertension Father    Sleep apnea Father    Obesity Father    Drug abuse Daughter    ADD / ADHD Daughter    Drug abuse Daughter    Anxiety disorder Daughter    Bipolar disorder Daughter    Hypertension Sister    Hypertension Brother    Hyperlipidemia Brother    Heart disease Brother    Early death Brother    Kidney disease Brother    Bipolar disorder Maternal Aunt    Suicidality Maternal Aunt     CURRENT MEDICATIONS:  Outpatient Encounter Medications as of 03/10/2024  Medication Sig   AJOVY 225 MG/1.5ML SOAJ Inject 1 mL into the skin every 30 (thirty) days.   ALPRAZolam  (XANAX ) 1 MG tablet Take 1 tablet (1 mg total) by mouth 2 (two) times daily as needed. for anxiety   amphetamine -dextroamphetamine  (ADDERALL) 30 MG tablet Take 1 tablet by mouth 2 (two) times daily.   amphetamine -dextroamphetamine  (ADDERALL) 30 MG tablet Take 1 tablet by mouth 2 (two) times daily.   amphetamine -dextroamphetamine  (ADDERALL) 30 MG tablet Take 1 tablet by mouth 2 (two) times daily.   amphetamine -dextroamphetamine  (ADDERALL) 30 MG tablet Take 1 tablet by mouth 2 (two) times daily.   aspirin  EC 81 MG tablet Take 81 mg by mouth daily.   atorvastatin  (LIPITOR) 40 MG tablet Take 1 tablet (40 mg total) by mouth daily.   cetirizine  (ZYRTEC ) 10 MG tablet Take 1 tablet (10 mg total) by mouth daily.   chlorhexidine  (PERIDEX ) 0.12 % solution Use as directed 5 mLs in the mouth or throat 4 (four) times daily.   cromolyn (OPTICROM) 4 % ophthalmic solution Place 1 drop into both eyes 2 (two) times daily.   estradiol  (ESTRACE  VAGINAL) 0.1 MG/GM vaginal cream Apply 0.5mg  (pea-sized amount)  just inside the vaginal introitus with  a finger-tip on Monday, Wednesday and Friday nights.   famotidine  (PEPCID ) 20 MG tablet Take 20 mg by mouth daily.   FLUoxetine  (PROZAC ) 40 MG capsule Take 1 capsule (40 mg total) by mouth 2 (two) times daily.   GARLIC PO Take 1 tablet by mouth daily.   HYDROcodone -acetaminophen  (NORCO) 10-325 MG tablet Take 1 tablet by mouth every 6 (six) hours as needed. To last 30 days from fill date   ketoconazole  (NIZORAL ) 2 % cream Apply to affected areas rash in body folds once to twice daily until improved. May use daily as a preventative.   levothyroxine  (SYNTHROID ) 125 MCG tablet Take 1 tablet (125 mcg total) by mouth daily before breakfast.   LINZESS  290 MCG CAPS capsule TAKE ONE CAPSULE BY MOUTH DAILY   lisinopril  (ZESTRIL ) 10 MG tablet Take 1 tablet (10 mg total) by mouth daily.   methocarbamol  (ROBAXIN ) 750 MG tablet Take 1 tablet (750 mg total) by mouth 2 (two) times daily as needed for muscle spasms.   mirabegron ER (MYRBETRIQ) 50 MG TB24 tablet Take 1 tablet (50 mg total) by mouth daily.   Multiple Vitamin (MULTIVITAMIN) capsule Take 1 capsule by mouth daily.   mupirocin  ointment (BACTROBAN ) 2 % Apply to open areas/sores twice daily until healed.   nitrofurantoin  (MACRODANTIN ) 100 MG capsule Take 1 capsule (100 mg total) by mouth daily.   omega-3 acid ethyl esters (LOVAZA) 1 g capsule Take by mouth.   pantoprazole  (PROTONIX ) 40 MG tablet TAKE 1 TABLET BY MOUTH  DAILY   pimecrolimus  (ELIDEL ) 1 % cream Apply to affected areas rash in body folds and legs once to twice daily until improved.   polyethylene glycol powder (GLYCOLAX /MIRALAX ) 17 GM/SCOOP powder TAKE 17 Gram BY MOUTH DAILY   potassium chloride  (KLOR-CON  M) 10 MEQ tablet Take 1 tablet (10 mEq total) by mouth 3 (three) times daily.   pregabalin  (LYRICA ) 100 MG capsule TAKE ONE CAPSULE BY MOUTH FOUR TIMES DAILY   RESTASIS  0.05 % ophthalmic emulsion Place 1 drop into both eyes 2 (two) times daily.   rOPINIRole  (REQUIP ) 2 MG tablet TAKE 1  TABLET BY MOUTH TWICE DAILY   Semaglutide , 1 MG/DOSE, 4 MG/3ML SOPN Inject 1 mg as directed once a week.   traZODone  (DESYREL ) 150 MG tablet Take 1 tablet (150 mg total) by mouth at bedtime.   UBRELVY 100 MG TABS Take by mouth.   valACYclovir  (VALTREX ) 1000 MG tablet Take 2 tablets by mouth at first onset of fever blisters, then take 2 tablets by mouth 12 hours later.   warfarin (COUMADIN ) 1 MG tablet Take one  along with 5 mg pill daily except for Mondays take 2 pills with 5 mg pill.  Equal to 6 mg daily except 7 mg on Monday.   warfarin (COUMADIN ) 5 MG tablet Take 7.5 mg daily except 5mg  on Saturday and Sunday.   [DISCONTINUED] methylPREDNISolone  (MEDROL  DOSEPAK) 4 MG TBPK tablet Take 6 pills on day 1, 5 pills on day 2, 4 pills on day 3, 3 pills on day 4, 2 pills on day 5, 1 pill on day 6   rizatriptan (MAXALT-MLT) 10 MG disintegrating tablet Take 10 mg by mouth as needed for migraine. (Patient not taking: Reported on 03/10/2024)   No facility-administered encounter medications on file as of 03/10/2024.    ALLERGIES:  Allergies  Allergen Reactions   Aripiprazole Other (See Comments)    BECOMES  VIOLENT    Seroquel  [Quetiapine  Fumarate] Other (See Comments)    BECOMES VIOLENT   Chlorpromazine  Other (See Comments)    SEVERE ANXIETY    Gabapentin  Other (See Comments)    NIGHTMARES    Prednisone  Hives   Quetiapine      LABORATORY DATA:  I have reviewed the labs as listed.  CBC    Component Value Date/Time   WBC 6.1 02/27/2024 0944   RBC 4.21 02/27/2024 0944   HGB 12.9 02/27/2024 0944   HGB 11.6 01/21/2024 1341   HCT 40.2 02/27/2024 0944   HCT 35.2 01/21/2024 1341   PLT 137 (L) 02/27/2024 0944   PLT 130 (L) 01/21/2024 1341   MCV 95.5 02/27/2024 0944   MCV 94 01/21/2024 1341   MCH 30.6 02/27/2024 0944   MCHC 32.1 02/27/2024 0944   RDW 13.0 02/27/2024 0944   RDW 13.0 01/21/2024 1341   LYMPHSABS 0.7 02/27/2024 0944   LYMPHSABS 1.6 01/21/2024 1341   MONOABS 0.6 02/27/2024 0944    EOSABS 0.0 02/27/2024 0944   EOSABS 0.1 01/21/2024 1341   BASOSABS 0.0 02/27/2024 0944   BASOSABS 0.0 01/21/2024 1341      Latest Ref Rng & Units 02/27/2024    9:44 AM 12/02/2023   11:14 AM 09/11/2023    1:12 PM  CMP  Glucose 70 - 99 mg/dL 161  83  84   BUN 6 - 20 mg/dL 27  25  28    Creatinine 0.44 - 1.00 mg/dL 0.96  0.45  4.09   Sodium 135 - 145 mmol/L 138  142  138   Potassium  3.5 - 5.1 mmol/L 4.3  4.6  4.2   Chloride 98 - 111 mmol/L 100  103  102   CO2 22 - 32 mmol/L 27  25  27    Calcium  8.9 - 10.3 mg/dL 9.9  9.6  9.3   Total Protein 6.5 - 8.1 g/dL 8.0  6.7  7.2   Total Bilirubin 0.0 - 1.2 mg/dL 0.6  0.4  0.4   Alkaline Phos 38 - 126 U/L 72  84  70   AST 15 - 41 U/L 15  15  17    ALT 0 - 44 U/L 18  17  19      DIAGNOSTIC IMAGING:  I have independently reviewed the relevant imaging and discussed with the patient.   WRAP UP:  All questions were answered. The patient knows to call the clinic with any problems, questions or concerns.  Medical decision making: Low  Time spent on visit: I spent 15 minutes counseling the patient face to face. The total time spent in the appointment was 22 minutes and more than 50% was on counseling.  Sonnie Dusky, PA-C  03/10/24 9:31 AM

## 2024-03-10 ENCOUNTER — Inpatient Hospital Stay: Payer: MEDICAID | Attending: Physician Assistant | Admitting: Physician Assistant

## 2024-03-10 ENCOUNTER — Telehealth: Payer: Self-pay

## 2024-03-10 VITALS — BP 108/59 | HR 56 | Temp 97.5°F | Resp 17 | Ht 68.0 in | Wt 233.0 lb

## 2024-03-10 DIAGNOSIS — D631 Anemia in chronic kidney disease: Secondary | ICD-10-CM | POA: Diagnosis not present

## 2024-03-10 DIAGNOSIS — D696 Thrombocytopenia, unspecified: Secondary | ICD-10-CM | POA: Diagnosis not present

## 2024-03-10 DIAGNOSIS — Z86718 Personal history of other venous thrombosis and embolism: Secondary | ICD-10-CM | POA: Insufficient documentation

## 2024-03-10 DIAGNOSIS — K648 Other hemorrhoids: Secondary | ICD-10-CM | POA: Diagnosis not present

## 2024-03-10 DIAGNOSIS — D508 Other iron deficiency anemias: Secondary | ICD-10-CM | POA: Insufficient documentation

## 2024-03-10 DIAGNOSIS — D509 Iron deficiency anemia, unspecified: Secondary | ICD-10-CM

## 2024-03-10 DIAGNOSIS — N1831 Chronic kidney disease, stage 3a: Secondary | ICD-10-CM | POA: Diagnosis not present

## 2024-03-10 DIAGNOSIS — D649 Anemia, unspecified: Secondary | ICD-10-CM | POA: Diagnosis not present

## 2024-03-10 DIAGNOSIS — Z7901 Long term (current) use of anticoagulants: Secondary | ICD-10-CM | POA: Diagnosis not present

## 2024-03-10 DIAGNOSIS — R319 Hematuria, unspecified: Secondary | ICD-10-CM | POA: Insufficient documentation

## 2024-03-10 NOTE — Telephone Encounter (Signed)
 PA was sent, waiting on response

## 2024-03-11 ENCOUNTER — Ambulatory Visit (HOSPITAL_COMMUNITY): Payer: MEDICAID

## 2024-03-12 ENCOUNTER — Telehealth: Payer: Self-pay | Admitting: Family Medicine

## 2024-03-12 ENCOUNTER — Other Ambulatory Visit: Payer: Self-pay

## 2024-03-12 MED ORDER — WARFARIN SODIUM 1 MG PO TABS: ORAL_TABLET | ORAL | 1 refills | Status: DC

## 2024-03-12 NOTE — Telephone Encounter (Signed)
 [facility] faxed refill request for the following medications:   warfarin (COUMADIN ) 1 MG tablet   Please advise

## 2024-03-15 NOTE — Progress Notes (Unsigned)
 PROVIDER NOTE: Interpretation of information contained herein should be left to medically-trained personnel. Specific patient instructions are provided elsewhere under "Patient Instructions" section of medical record. This document was created in part using AI and STT-dictation technology, any transcriptional errors that may result from this process are unintentional.  Patient: Jane [name]  Service: E/M   PCP: Mazie Speed, MD  DOB: [birth year]  DOS: 03/16/2024  Provider: Cherylin Corrigan, NP  MRN: [id number]  Delivery: Face-to-face  Specialty: Interventional Pain Management  Type: Established Patient  Setting: Ambulatory outpatient facility  Specialty designation: 09  Referring Prov.: David Escort Stan Eans, MD  Location: Outpatient office facility       HPI  Jane [name], a 58 y.o. year old female, is here today because of her Failed back surgical syndrome [M96.1]. Jane [name] primary complain today is No chief complaint on file.   Pain Assessment: Severity of   is reported as a  /10. Location:    / . Onset:  . Quality:  . Timing:  . Modifying factor(s):  Aaron Aas Vitals:  vitals were not taken for this visit.  BMI: Estimated body mass index is 35.43 kg/m as calculated from the following:   Height as of 03/10/24: 5\' 8"  (1.727 m).   Weight as of 03/10/24: 233 lb (105.7 kg). Last encounter: 12/09/2023.  Reason for encounter: medication management. No change in medical history since last visit.  Patient's pain is at baseline.  Patient continues multimodal pain regimen as prescribed.  States that it provides pain relief and improvement in functional status.    Pharmacotherapy Assessment  Analgesic: {There is no content from the last Subjective section.}   Monitoring: [place] PMP: PDMP reviewed during this encounter.       Pharmacotherapy: No side-effects or adverse reactions reported. Compliance: No problems identified. Effectiveness: Clinically acceptable.  No notes on file  No results  found for: "CBDTHCR" No results found for: "D8THCCBX" No results found for: "D9THCCBX"  UDS:  Summary  Date Value Ref Range Status  06/17/2023 Note  Final    Comment:    ==================================================================== [facility] (MW) ==================================================================== Test                             Result       Flag       Units  Drug Present and Declared for Prescription Verification   Amphetamine                     2908         EXPECTED   ng/mg creat    Amphetamine  is available as a schedule II prescription drug.    Hydrocodone                     1339         EXPECTED   ng/mg creat   Dihydrocodeine                 221          EXPECTED   ng/mg creat   Norhydrocodone                 697          EXPECTED   ng/mg creat    Sources of hydrocodone  include scheduled prescription medications.    Dihydrocodeine and norhydrocodone are expected metabolites of    hydrocodone . Dihydrocodeine is also available as a scheduled  prescription medication.  Drug Absent but Declared for Prescription Verification   Alprazolam                      Not Detected UNEXPECTED ng/mg creat ==================================================================== Test                      Result    Flag   Units      Ref Range   Creatinine              61               mg/dL      >=16 ==================================================================== Declared Medications:  The flagging and interpretation on this report are based on the  following declared medications.  Unexpected results may arise from  inaccuracies in the declared medications.   **Note: The testing scope of this panel includes these medications:   Alprazolam  (Xanax )  Amphetamine  (Adderall)  Hydrocodone  (Norco)   **Note: The testing scope of this panel does not include the  following reported medications:   Acetaminophen  (Norco)  Aspirin   Atorvastatin  (Lipitor)   Cetirizine  (Zyrtec )  Estradiol  (Estrace )  Famotidine  (Pepcid )  Fluoxetine  (Prozac )  Ketoconazole  (Nizoral )  Levothyroxine  (Synthroid )  Linaclotide  (Linzess )  Methocarbamol  (Robaxin )  Multivitamin  Omega-3 Fatty Acids  Pantoprazole  (Protonix )  Polyethylene Glycol (MiraLAX )  Potassium (Klor-Con )  Pregabalin  (Lyrica )  Ropinirole  (Requip )  Semaglutide   Supplement  Trazodone  (Desyrel )  Valacyclovir  (Valtrex )  Vibegron  (Gemtesa )  Warfarin (Coumadin ) ==================================================================== For clinical consultation, please call [phone]. ====================================================================       ROS  Constitutional: Denies any fever or chills Gastrointestinal: No reported hemesis, hematochezia, vomiting, or acute GI distress Musculoskeletal: Denies any acute onset joint swelling, redness, loss of ROM, or weakness Neurological: No reported episodes of acute onset apraxia, aphasia, dysarthria, agnosia, amnesia, paralysis, loss of coordination, or loss of consciousness  Medication Review  ALPRAZolam , FLUoxetine , Fremanezumab-vfrm, Garlic, HYDROcodone -acetaminophen , Semaglutide  (1 MG/DOSE), Ubrogepant, amphetamine -dextroamphetamine , aspirin  EC, atorvastatin , cetirizine , chlorhexidine , cromolyn, cycloSPORINE , estradiol , famotidine , ketoconazole , levothyroxine , linaclotide , lisinopril , methocarbamol , mirabegron  ER, multivitamin, mupirocin  ointment, nitrofurantoin , omega-3 acid ethyl esters, pantoprazole , pimecrolimus , polyethylene glycol powder, potassium chloride , pregabalin , rOPINIRole , rizatriptan, traZODone , valACYclovir , and warfarin  History Review  Allergy: Jane [name] is allergic to aripiprazole, seroquel  [quetiapine  fumarate], chlorpromazine , gabapentin , prednisone , and quetiapine . Drug: Jane [name]  reports no history of drug use. Alcohol:  reports no history of alcohol use. Tobacco:  reports that she has never smoked. She  has never used smokeless tobacco. Social: Jane [name]  reports that she has never smoked. She has never used smokeless tobacco. She reports that she does not drink alcohol and does not use drugs. Medical:  has a past medical history of ADD (attention deficit disorder), Allergy, Anxiety, Arthritis (2021), Back pain, Bilateral swelling of feet, Bipolar 1 disorder (HCC), Bipolar 1 disorder (HCC), Bipolar disorder (HCC), Blood transfusion without reported diagnosis (2020), Chewing difficulty, Chronic fatigue syndrome, Chronic kidney disease, Clotting disorder (HCC), Constipation, Depression (2000), Diabetes mellitus without complication (HCC), Dyspnea, GERD (gastroesophageal reflux disease), Headache, High cholesterol, History of blood clots, History of DVT (deep vein thrombosis), Hypertension, Hypothyroidism, Joint pain, Neuromuscular disorder (HCC), Neuropathy, Obsessive-compulsive disorder, Peripheral vascular disease (HCC), Prediabetes, Respiratory failure requiring intubation (HCC), Restless leg syndrome, Shortness of breath, Sleep apnea, Thrombocytopenia (HCC) (09/15/2019), Trigger thumb of left hand (01/2018), and Trigger thumb of right hand. Surgical: Jane [name]  has a past surgical history that includes Cholecystectomy; Trigger finger release (Right, 12/01/2017); Abdominal hysterectomy (06/2016); Lumbar fusion (11/21/2000); Lumbar spine surgery;  Trigger finger release (Left, 01/26/2018); LOWER EXTREMITY VENOGRAPHY (N/A, 08/17/2018); Femoral-femoral Bypass Graft (Left, 11/24/2018); AV fistula placement (Left, 11/24/2018); LOWER EXTREMITY VENOGRAPHY (Bilateral, 03/09/2019); Patch angioplasty (Left, 03/30/2019); Ultrasound guidance for vascular access (Right, 03/30/2019); Femoral artery debridement (03/30/2019); Insertion of iliac stent (03/30/2019); Groin debridement (Left, 04/27/2019); Application if wound vac (Left, 04/27/2019); LOWER EXTREMITY VENOGRAPHY (Left, 08/16/2019); PERIPHERAL VASCULAR  INTERVENTION (Left, 08/16/2019); Back surgery; Colonoscopy with propofol  (N/A, 11/22/2019); Knee arthroscopy with meniscal repair (Left, 11/14/2020); LOWER EXTREMITY VENOGRAPHY (Left, 03/25/2022); PERIPHERAL VASCULAR THROMBECTOMY (Left, 03/25/2022); PERIPHERAL VASCULAR INTERVENTION (Left, 03/25/2022); Colonoscopy with propofol  (N/A, 12/09/2022); and Spine surgery. Family: family history includes ADD / ADHD in her daughter; Anxiety disorder in her daughter; Bipolar disorder in her daughter, maternal aunt, and mother; Depression in her mother; Diabetes in her father; Drug abuse in her daughter and daughter; Early death in her brother; Heart disease in her brother, father, and mother; Hyperlipidemia in her brother, father, and mother; Hypertension in her brother, father, mother, and sister; Kidney disease in her brother; Obesity in her father and mother; Sleep apnea in her father and mother; Stroke in her mother; Suicidality in her maternal aunt.  Laboratory Chemistry Profile   Renal Lab Results  Component Value Date   BUN 27 (H) 02/27/2024   CREATININE 1.24 (H) 02/27/2024   BCR 19 12/02/2023   GFRAA 53 (L) 07/18/2020   GFRNONAA 51 (L) 02/27/2024    Hepatic Lab Results  Component Value Date   AST 15 02/27/2024   ALT 18 02/27/2024   ALBUMIN  4.3 02/27/2024   ALKPHOS 72 02/27/2024   HCVAB NON REACTIVE 11/06/2021    Electrolytes Lab Results  Component Value Date   NA 138 02/27/2024   K 4.3 02/27/2024   CL 100 02/27/2024   CALCIUM  9.9 02/27/2024   MG 2.1 08/06/2022   PHOS 4.0 04/04/2019    Bone Lab Results  Component Value Date   VD25OH 48.8 03/14/2023    Inflammation (CRP: Acute Phase) (ESR: Chronic Phase) Lab Results  Component Value Date   CRP 11 (H) 02/16/2020   ESRSEDRATE 33 02/16/2020   LATICACIDVEN 1.2 01/05/2020         Note: Above Lab results reviewed.  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  EXAM: CT VENOGRAM ABDOMEN AND PELVIS  TECHNIQUE: Venographic phase images of the abdomen and pelvis were obtained following the administration of intravenous contrast. Multiplanar reformats and maximum intensity projections were generated.  RADIATION DOSE REDUCTION: This exam was performed according to the departmental dose-optimization program which includes automated exposure control, adjustment of the mA and/or kV according to patient size and/or use of iterative reconstruction technique.  CONTRAST:  ISOVUE -370 IOPAMIDOL  (ISOVUE -370) INJECTION 76%  COMPARISON:  Prior CT venogram on 06/10/2022  FINDINGS: Lower chest: No acute abnormality.  Hepatobiliary: No focal liver abnormality is seen. Status post cholecystectomy. Stable dilatation of the common bile duct post cholecystectomy measuring up to 11 mm.  Pancreas: Unremarkable. No pancreatic ductal dilatation or surrounding inflammatory changes.  Spleen: Normal in size without focal abnormality.  Adrenals/Urinary Tract: Adrenal glands are unremarkable. Kidneys are normal, without renal calculi, focal lesion, or hydronephrosis. Bladder is unremarkable.  Stomach/Bowel: Bowel shows no evidence of obstruction, ileus, inflammation or lesion. The appendix is surgically absent. No free intraperitoneal air.  Lymphatic: No lymphadenopathy.  Reproductive: Status post hysterectomy. No adnexal  masses.  Other: No abdominal wall hernia or abnormality. No abdominopelvic ascites.  Musculoskeletal: Stable degenerative disc disease of the lumbar spine with prior cage fusion at L5-S1.  IVC: Normally patent.  No evidence of thrombus or stenosis.  Portal and mesenteric veins: Normally patent.  Bilateral iliac veins: The extensive overlapping left-sided iliofemoral stents  extending from the left common iliac origin to the level of the proximal left femoral vein demonstrate poor internal opacification and may be chronically occluded. Slow internal flow cannot be excluded by CT. Right iliac veins are normally patent.  Right lower extremity: Visualized common femoral vein and femoral bifurcation are normally patent. Prominent collateral veins communicating with the right great saphenous vein and extending up into the pubic region.  Left lower extremity: Below the stented femoral vein, the native femoral vein in the proximal to mid left thigh appears atretic. There are numerous superficial collateral veins communicating with the great saphenous vein and extending over the pubic region and left abdominal wall.  IMPRESSION: 1. The extensive overlapping left-sided iliofemoral stents extending from the left common iliac origin to the level of the proximal left femoral vein demonstrate poor internal opacification and may be chronically occluded. Slow internal flow cannot be excluded by CT. 2. Below the stented femoral vein, the native femoral vein in the proximal to mid left thigh appears atretic. There are numerous superficial collateral veins communicating with the great saphenous vein and extending over the pubic region and left abdominal wall. 3. Right iliac veins are normally patent. 4. Status post cholecystectomy with stable dilatation of the common bile duct post cholecystectomy measuring up to 11 mm. 5. Stable degenerative disc disease of the lumbar spine with prior cage fusion at L5-S1.  Electronically Signed   By: Erica Hau M.D.   On: 02/12/2024 14:46 Note: Reviewed         Physical Exam  General appearance: Well nourished, well developed, and well hydrated. In no apparent acute distress Mental status: Alert, oriented x 3 (person, place, & time)       Respiratory: No evidence of acute respiratory distress Eyes: PERLA Vitals: LMP  (LMP  Unknown)  BMI: Estimated body mass index is 35.43 kg/m as calculated from the following:   Height as of 03/10/24: 5\' 8"  (1.727 m).   Weight as of 03/10/24: 233 lb (105.7 kg). Ideal: Ideal body weight: 63.9 kg (140 lb 14 oz) Adjusted ideal body weight: 80.6 kg (177 lb 11.6 oz)  Assessment   Diagnosis Status  1. Failed back surgical syndrome   2. Chronic pain syndrome   3. Diabetic peripheral neuropathy (HCC)   4. Chronic musculoskeletal pain   5. Bilateral chronic knee pain   6. 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  Orders:  No orders of the defined types were placed in this encounter.  Follow-up plan:   No follow-ups on file.    Recent Visits No visits were found meeting these conditions. Showing recent visits within past 90 days and meeting all other requirements Future Appointments Date Type Provider Dept  03/16/24 Appointment Cartier Mapel K, NP [facility]  Showing future appointments within next 90 days and meeting all other requirements  I discussed the assessment and treatment plan with the patient.  The patient was provided an opportunity to ask questions and all were answered. The patient agreed with the plan and demonstrated an understanding of the instructions.  Patient advised to call back or seek an in-person evaluation if the symptoms or condition worsens.  Duration of encounter: *** minutes.  Total time on encounter, as per AMA guidelines included both the face-to-face and non-face-to-face time personally spent by the physician and/or other qualified health care professional(s) on the day of the encounter (includes time in activities that require the physician or other qualified health care professional and does not include time in activities  normally performed by clinical staff). Physician's time may include the following activities when performed: Preparing to see the patient (e.g., pre-charting review of records, searching for previously ordered imaging, lab work, and nerve conduction tests) Review of prior analgesic pharmacotherapies. Reviewing PMP Interpreting ordered tests (e.g., lab work, imaging, nerve conduction tests) Performing post-procedure evaluations, including interpretation of diagnostic procedures Obtaining and/or reviewing separately obtained history Performing a medically appropriate examination and/or evaluation Counseling and educating the patient/family/caregiver Ordering medications, tests, or procedures Referring and communicating with other health care professionals (when not separately reported) Documenting clinical information in the electronic or other health record Independently interpreting results (not separately reported) and communicating results to the patient/ family/caregiver Care coordination (not separately reported)  Note by: Alianys Chacko K Anaiz Qazi, NP (TTS and AI technology used. I apologize for any typographical errors that were not detected and corrected.) Date: 03/16/2024; Time: 12:06 PM

## 2024-03-16 ENCOUNTER — Ambulatory Visit
Payer: MEDICAID | Attending: Student in an Organized Health Care Education/Training Program | Admitting: Nurse Practitioner

## 2024-03-16 ENCOUNTER — Encounter: Payer: Self-pay | Admitting: Nurse Practitioner

## 2024-03-16 VITALS — BP 133/63 | HR 62 | Temp 98.0°F | Resp 16 | Ht 68.0 in | Wt 233.0 lb

## 2024-03-16 DIAGNOSIS — G894 Chronic pain syndrome: Secondary | ICD-10-CM | POA: Diagnosis present

## 2024-03-16 DIAGNOSIS — M25562 Pain in left knee: Secondary | ICD-10-CM | POA: Insufficient documentation

## 2024-03-16 DIAGNOSIS — M961 Postlaminectomy syndrome, not elsewhere classified: Secondary | ICD-10-CM | POA: Diagnosis present

## 2024-03-16 DIAGNOSIS — E1142 Type 2 diabetes mellitus with diabetic polyneuropathy: Secondary | ICD-10-CM | POA: Insufficient documentation

## 2024-03-16 DIAGNOSIS — M7918 Myalgia, other site: Secondary | ICD-10-CM | POA: Diagnosis present

## 2024-03-16 DIAGNOSIS — Z0289 Encounter for other administrative examinations: Secondary | ICD-10-CM | POA: Diagnosis present

## 2024-03-16 DIAGNOSIS — I871 Compression of vein: Secondary | ICD-10-CM | POA: Insufficient documentation

## 2024-03-16 DIAGNOSIS — M25561 Pain in right knee: Secondary | ICD-10-CM | POA: Insufficient documentation

## 2024-03-16 DIAGNOSIS — G8929 Other chronic pain: Secondary | ICD-10-CM | POA: Insufficient documentation

## 2024-03-16 MED ORDER — HYDROCODONE-ACETAMINOPHEN 10-325 MG PO TABS: 1.0000 | ORAL_TABLET | Freq: Four times a day (QID) | ORAL | 0 refills | Status: DC | PRN

## 2024-03-16 MED ORDER — HYDROCODONE-ACETAMINOPHEN 10-325 MG PO TABS: 1.0000 | ORAL_TABLET | Freq: Four times a day (QID) | ORAL | 0 refills | Status: AC | PRN

## 2024-03-16 MED ORDER — CYCLOBENZAPRINE HCL 5 MG PO TABS: 5.0000 mg | ORAL_TABLET | Freq: Three times a day (TID) | ORAL | 2 refills | Status: AC | PRN

## 2024-03-16 NOTE — Progress Notes (Signed)
 Nursing Pain Medication Assessment:  Safety precautions to be maintained throughout the outpatient stay will include: orient to surroundings, keep bed in low position, maintain call bell within reach at all times, provide assistance with transfer out of bed and ambulation.  Medication Inspection Compliance: Pill count conducted under aseptic conditions, in front of the patient. Neither the pills nor the bottle was removed from the patient's sight at any time. Once count was completed pills were immediately returned to the patient in their original bottle.  Medication: Hydrocodone /APAP Pill/Patch Count: 26 of 120 pills/patches remain Pill/Patch Appearance: Markings consistent with prescribed medication Bottle Appearance: Standard pharmacy container. Clearly labeled. Filled Date: 04 / 19 / 2025 Last Medication intake:  Today

## 2024-03-17 ENCOUNTER — Ambulatory Visit (HOSPITAL_COMMUNITY)
Admission: RE | Admit: 2024-03-17 | Discharge: 2024-03-17 | Disposition: A | Discharge status: Home / Self Care | Payer: MEDICAID | Source: Ambulatory Visit | Attending: Family Medicine | Admitting: Family Medicine

## 2024-03-17 ENCOUNTER — Ambulatory Visit: Payer: MEDICAID

## 2024-03-17 DIAGNOSIS — R221 Localized swelling, mass and lump, neck: Secondary | ICD-10-CM | POA: Diagnosis present

## 2024-03-17 DIAGNOSIS — M542 Cervicalgia: Secondary | ICD-10-CM | POA: Diagnosis present

## 2024-03-19 ENCOUNTER — Encounter: Payer: Self-pay | Admitting: Family Medicine

## 2024-03-22 NOTE — Telephone Encounter (Signed)
 Please see the request below pt was seen in the ED on 6/2

## 2024-03-23 ENCOUNTER — Ambulatory Visit: Payer: Self-pay | Admitting: Family Medicine

## 2024-03-23 NOTE — Telephone Encounter (Signed)
 I think she is referring to the results from the ultrasound that you ordered at your recent visit.

## 2024-03-25 ENCOUNTER — Encounter: Payer: Self-pay | Admitting: Family Medicine

## 2024-03-25 ENCOUNTER — Ambulatory Visit: Payer: MEDICAID | Admitting: Family Medicine

## 2024-03-25 VITALS — BP 125/70 | HR 54 | Ht 69.0 in | Wt 236.3 lb

## 2024-03-25 DIAGNOSIS — M5412 Radiculopathy, cervical region: Secondary | ICD-10-CM | POA: Diagnosis not present

## 2024-03-25 DIAGNOSIS — Z5982 Transportation insecurity: Secondary | ICD-10-CM | POA: Diagnosis not present

## 2024-03-25 DIAGNOSIS — I871 Compression of vein: Secondary | ICD-10-CM

## 2024-03-25 DIAGNOSIS — S161XXA Strain of muscle, fascia and tendon at neck level, initial encounter: Secondary | ICD-10-CM | POA: Diagnosis not present

## 2024-03-25 DIAGNOSIS — I152 Hypertension secondary to endocrine disorders: Secondary | ICD-10-CM

## 2024-03-25 DIAGNOSIS — E1159 Type 2 diabetes mellitus with other circulatory complications: Secondary | ICD-10-CM

## 2024-03-25 NOTE — Assessment & Plan Note (Addendum)
 Blood pressure was normal in the office today. Patient discontinued her BP meds 2-3 weeks ago because she was having hypotensive episodes. She reports feeling well since discontinuing her Lisinopril .  -Consider restarting Lisinopril  at lower dose to prevent hypotensive episodes but still provide renal protection.

## 2024-03-25 NOTE — Assessment & Plan Note (Signed)
 Followed by vascular surgery- Is getting venography by Dr. Vikki Graves on 6/2. -Continue Coumadin 

## 2024-03-25 NOTE — Assessment & Plan Note (Signed)
 Jane [name] has been having radicular neck pain for around 2-3 months. She initially felt she had slept on it wrong but it has not gotten better with stretching or her flexeril . She does have a positive spurlings test and notable cervical kyphosis on physical exam. She has seen ortho and neurosurgery for her lumbar and thoracic spine, but has not had recent imaging of her cervical spine. She is unable to take NSAIDs due to CKD or prednisone  due to allergy. -Obtain x-ray of c-spine -Follow up with pain management and ortho/neuro
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
## 2024-03-25 NOTE — Assessment & Plan Note (Signed)
 In addition to her radicular symptoms, she does have a cervical muscular strain. She is tender to palpation on her posterior and lateral neck.  -voltaren gel recommended as well as following up with pain medicine and stretching.

## 2024-03-26 LAB — POCT INR: INR: 2.5 (ref 2.0–3.0)

## 2024-04-02 ENCOUNTER — Telehealth: Payer: Self-pay | Admitting: Family Medicine

## 2024-04-05 ENCOUNTER — Other Ambulatory Visit (HOSPITAL_COMMUNITY): Payer: Self-pay | Admitting: Psychiatry

## 2024-04-05 ENCOUNTER — Ambulatory Visit (HOSPITAL_COMMUNITY)
Admission: RE | Disposition: A | Discharge status: Home / Self Care | Payer: Self-pay | Source: Home / Self Care | Attending: Vascular Surgery

## 2024-04-05 ENCOUNTER — Observation Stay (HOSPITAL_COMMUNITY)
Admission: RE | Admit: 2024-04-05 | Discharge: 2024-04-06 | Disposition: A | Discharge status: Home / Self Care | Payer: MEDICAID | Attending: Vascular Surgery | Admitting: Vascular Surgery

## 2024-04-05 ENCOUNTER — Encounter (HOSPITAL_COMMUNITY): Payer: Self-pay | Admitting: Vascular Surgery

## 2024-04-05 ENCOUNTER — Other Ambulatory Visit: Payer: Self-pay

## 2024-04-05 DIAGNOSIS — Z7901 Long term (current) use of anticoagulants: Secondary | ICD-10-CM | POA: Diagnosis not present

## 2024-04-05 DIAGNOSIS — I871 Compression of vein: Secondary | ICD-10-CM | POA: Diagnosis present

## 2024-04-05 DIAGNOSIS — I82512 Chronic embolism and thrombosis of left femoral vein: Secondary | ICD-10-CM

## 2024-04-05 DIAGNOSIS — Z7982 Long term (current) use of aspirin: Secondary | ICD-10-CM | POA: Insufficient documentation

## 2024-04-05 DIAGNOSIS — E039 Hypothyroidism, unspecified: Secondary | ICD-10-CM | POA: Insufficient documentation

## 2024-04-05 DIAGNOSIS — T8172XA Complication of vein following a procedure, not elsewhere classified, initial encounter: Secondary | ICD-10-CM | POA: Diagnosis not present

## 2024-04-05 DIAGNOSIS — T82858A Stenosis of vascular prosthetic devices, implants and grafts, initial encounter: Secondary | ICD-10-CM | POA: Diagnosis not present

## 2024-04-05 DIAGNOSIS — N183 Chronic kidney disease, stage 3 unspecified: Secondary | ICD-10-CM | POA: Insufficient documentation

## 2024-04-05 DIAGNOSIS — E1122 Type 2 diabetes mellitus with diabetic chronic kidney disease: Secondary | ICD-10-CM | POA: Insufficient documentation

## 2024-04-05 DIAGNOSIS — Z9889 Other specified postprocedural states: Secondary | ICD-10-CM | POA: Diagnosis not present

## 2024-04-05 DIAGNOSIS — Z86718 Personal history of other venous thrombosis and embolism: Secondary | ICD-10-CM | POA: Insufficient documentation

## 2024-04-05 DIAGNOSIS — Z79899 Other long term (current) drug therapy: Secondary | ICD-10-CM | POA: Insufficient documentation

## 2024-04-05 DIAGNOSIS — I129 Hypertensive chronic kidney disease with stage 1 through stage 4 chronic kidney disease, or unspecified chronic kidney disease: Secondary | ICD-10-CM | POA: Diagnosis not present

## 2024-04-05 DIAGNOSIS — I87002 Postthrombotic syndrome without complications of left lower extremity: Secondary | ICD-10-CM

## 2024-04-05 HISTORY — PX: LOWER EXTREMITY INTERVENTION: CATH118252

## 2024-04-05 HISTORY — PX: PERIPHERAL VASCULAR ULTRASOUND/IVUS: CATH118334

## 2024-04-05 HISTORY — PX: PERIPHERAL VASCULAR THROMBECTOMY: CATH118306

## 2024-04-05 HISTORY — PX: LOWER EXTREMITY VENOGRAPHY: CATH118253

## 2024-04-05 LAB — POCT I-STAT, CHEM 8
BUN: 25 mg/dL — ABNORMAL HIGH (ref 6–20)
Calcium, Ion: 1.25 mmol/L (ref 1.15–1.40)
Chloride: 104 mmol/L (ref 98–111)
Creatinine, Ser: 1.4 mg/dL — ABNORMAL HIGH (ref 0.44–1.00)
Glucose, Bld: 103 mg/dL — ABNORMAL HIGH (ref 70–99)
HCT: 36 % (ref 36.0–46.0)
Hemoglobin: 12.2 g/dL (ref 12.0–15.0)
Potassium: 4 mmol/L (ref 3.5–5.1)
Sodium: 144 mmol/L (ref 135–145)
TCO2: 28 mmol/L (ref 22–32)

## 2024-04-05 LAB — CBC
HCT: 37.3 % (ref 36.0–46.0)
Hemoglobin: 11.8 g/dL — ABNORMAL LOW (ref 12.0–15.0)
MCH: 30.1 pg (ref 26.0–34.0)
MCHC: 31.6 g/dL (ref 30.0–36.0)
MCV: 95.2 fL (ref 80.0–100.0)
Platelets: 98 10*3/uL — ABNORMAL LOW (ref 150–400)
RBC: 3.92 MIL/uL (ref 3.87–5.11)
RDW: 12.8 % (ref 11.5–15.5)
WBC: 5.3 10*3/uL (ref 4.0–10.5)
nRBC: 0 % (ref 0.0–0.2)

## 2024-04-05 LAB — PROTIME-INR
INR: 2.3 — ABNORMAL HIGH (ref 0.8–1.2)
Prothrombin Time: 25.4 s — ABNORMAL HIGH (ref 11.4–15.2)

## 2024-04-05 LAB — GLUCOSE, CAPILLARY: Glucose-Capillary: 118 mg/dL — ABNORMAL HIGH (ref 70–99)

## 2024-04-05 MED ORDER — ACETAMINOPHEN 325 MG PO TABS
650.0000 mg | ORAL_TABLET | ORAL | Status: DC | PRN
Start: 2024-04-05 — End: 2024-04-06
  Administered 2024-04-05: 650 mg via ORAL
  Filled 2024-04-05: qty 2

## 2024-04-05 MED ORDER — WARFARIN SODIUM 5 MG PO TABS
7.0000 mg | ORAL_TABLET | Freq: Once | ORAL | Status: AC
  Administered 2024-04-05: 7 mg via ORAL
  Filled 2024-04-05: qty 1

## 2024-04-05 MED ORDER — LINACLOTIDE 145 MCG PO CAPS
290.0000 ug | ORAL_CAPSULE | Freq: Every day | ORAL | Status: DC
  Administered 2024-04-05 – 2024-04-06 (×2): 290 ug via ORAL
  Filled 2024-04-05 (×3): qty 2

## 2024-04-05 MED ORDER — AMPHETAMINE-DEXTROAMPHETAMINE 10 MG PO TABS
30.0000 mg | ORAL_TABLET | Freq: Two times a day (BID) | ORAL | Status: DC
  Administered 2024-04-05 – 2024-04-06 (×2): 30 mg via ORAL
  Filled 2024-04-05 (×3): qty 3

## 2024-04-05 MED ORDER — HYDROMORPHONE HCL 1 MG/ML IJ SOLN
INTRAMUSCULAR | Status: AC
  Filled 2024-04-05: qty 1

## 2024-04-05 MED ORDER — SUMATRIPTAN SUCCINATE 100 MG PO TABS
100.0000 mg | ORAL_TABLET | ORAL | Status: DC | PRN
  Administered 2024-04-05 – 2024-04-06 (×3): 100 mg via ORAL
  Filled 2024-04-05 (×5): qty 1

## 2024-04-05 MED ORDER — LABETALOL HCL 5 MG/ML IV SOLN: 10.0000 mg | INTRAVENOUS | Status: DC | PRN

## 2024-04-05 MED ORDER — WARFARIN - PHARMACIST DOSING INPATIENT: Freq: Every day | Status: DC

## 2024-04-05 MED ORDER — HEPARIN SODIUM (PORCINE) 1000 UNIT/ML IJ SOLN
INTRAMUSCULAR | Status: AC
  Filled 2024-04-05: qty 10

## 2024-04-05 MED ORDER — SODIUM CHLORIDE 0.9% FLUSH
3.0000 mL | Freq: Two times a day (BID) | INTRAVENOUS | Status: DC
  Administered 2024-04-05 – 2024-04-06 (×3): 3 mL via INTRAVENOUS

## 2024-04-05 MED ORDER — LORATADINE 10 MG PO TABS
10.0000 mg | ORAL_TABLET | Freq: Every day | ORAL | Status: DC
  Administered 2024-04-05 – 2024-04-06 (×2): 10 mg via ORAL
  Filled 2024-04-05 (×3): qty 1

## 2024-04-05 MED ORDER — CYCLOBENZAPRINE HCL 10 MG PO TABS
5.0000 mg | ORAL_TABLET | Freq: Three times a day (TID) | ORAL | Status: DC | PRN
  Administered 2024-04-05: 5 mg via ORAL
  Filled 2024-04-05: qty 1

## 2024-04-05 MED ORDER — LEVOTHYROXINE SODIUM 25 MCG PO TABS
125.0000 ug | ORAL_TABLET | Freq: Every day | ORAL | Status: DC
  Administered 2024-04-06: 125 ug via ORAL
  Filled 2024-04-05: qty 1

## 2024-04-05 MED ORDER — HEPARIN SODIUM (PORCINE) 1000 UNIT/ML IJ SOLN
INTRAMUSCULAR | Status: DC | PRN
  Administered 2024-04-05 (×2): 5000 [IU] via INTRAVENOUS

## 2024-04-05 MED ORDER — ADULT MULTIVITAMIN W/MINERALS CH
1.0000 | ORAL_TABLET | Freq: Every day | ORAL | Status: DC
  Administered 2024-04-05 – 2024-04-06 (×2): 1 via ORAL
  Filled 2024-04-05 (×3): qty 1

## 2024-04-05 MED ORDER — HEPARIN (PORCINE) IN NACL 1000-0.9 UT/500ML-% IV SOLN
INTRAVENOUS | Status: DC | PRN
Start: 2024-04-05 — End: 2024-04-05
  Administered 2024-04-05: 500 mL

## 2024-04-05 MED ORDER — ASPIRIN 81 MG PO CHEW
CHEWABLE_TABLET | ORAL | Status: AC
  Filled 2024-04-05: qty 1

## 2024-04-05 MED ORDER — SODIUM CHLORIDE 0.9 % IV SOLN: INTRAVENOUS | Status: AC

## 2024-04-05 MED ORDER — HYDROMORPHONE HCL 1 MG/ML IJ SOLN
1.0000 mg | INTRAMUSCULAR | Status: DC | PRN
  Administered 2024-04-05 (×4): 1 mg via INTRAVENOUS
  Filled 2024-04-05 (×2): qty 1

## 2024-04-05 MED ORDER — HYDRALAZINE HCL 20 MG/ML IJ SOLN: 5.0000 mg | INTRAMUSCULAR | Status: DC | PRN

## 2024-04-05 MED ORDER — FENTANYL CITRATE (PF) 100 MCG/2ML IJ SOLN
INTRAMUSCULAR | Status: AC
  Filled 2024-04-05: qty 2

## 2024-04-05 MED ORDER — LIDOCAINE HCL (PF) 1 % IJ SOLN
INTRAMUSCULAR | Status: AC
  Filled 2024-04-05: qty 30

## 2024-04-05 MED ORDER — SODIUM CHLORIDE 0.9 % IV SOLN: 250.0000 mL | INTRAVENOUS | Status: DC | PRN

## 2024-04-05 MED ORDER — MIDAZOLAM HCL 2 MG/2ML IJ SOLN
INTRAMUSCULAR | Status: DC | PRN
  Administered 2024-04-05 (×2): 1 mg via INTRAVENOUS

## 2024-04-05 MED ORDER — POLYETHYLENE GLYCOL 3350 17 GM/SCOOP PO POWD: 119.0000 g | Freq: Two times a day (BID) | ORAL | Status: DC | PRN

## 2024-04-05 MED ORDER — PREGABALIN 100 MG PO CAPS
100.0000 mg | ORAL_CAPSULE | Freq: Four times a day (QID) | ORAL | Status: DC
  Administered 2024-04-05 – 2024-04-06 (×4): 100 mg via ORAL
  Filled 2024-04-05 (×7): qty 1

## 2024-04-05 MED ORDER — OMEGA-3-ACID ETHYL ESTERS 1 G PO CAPS
1.0000 | ORAL_CAPSULE | Freq: Every day | ORAL | Status: DC
  Administered 2024-04-05 – 2024-04-06 (×2): 1 g via ORAL
  Filled 2024-04-05 (×3): qty 1

## 2024-04-05 MED ORDER — WARFARIN SODIUM 1 MG PO TABS: 1.0000 mg | ORAL_TABLET | Freq: Every day | ORAL | Status: DC

## 2024-04-05 MED ORDER — ROPINIROLE HCL 1 MG PO TABS
2.0000 mg | ORAL_TABLET | Freq: Two times a day (BID) | ORAL | Status: DC
  Administered 2024-04-05 – 2024-04-06 (×3): 2 mg via ORAL
  Filled 2024-04-05 (×4): qty 2

## 2024-04-05 MED ORDER — ASPIRIN 81 MG PO TBEC
81.0000 mg | DELAYED_RELEASE_TABLET | Freq: Every day | ORAL | Status: DC
  Administered 2024-04-06: 81 mg via ORAL
  Filled 2024-04-05: qty 1

## 2024-04-05 MED ORDER — SODIUM CHLORIDE 0.9% FLUSH
3.0000 mL | INTRAVENOUS | Status: DC | PRN
Start: 2024-04-05 — End: 2024-04-06

## 2024-04-05 MED ORDER — ATORVASTATIN CALCIUM 40 MG PO TABS
40.0000 mg | ORAL_TABLET | Freq: Every day | ORAL | Status: DC
  Administered 2024-04-05 – 2024-04-06 (×2): 40 mg via ORAL
  Filled 2024-04-05 (×3): qty 1

## 2024-04-05 MED ORDER — HYDROCODONE-ACETAMINOPHEN 10-325 MG PO TABS: 2.0000 | ORAL_TABLET | Freq: Four times a day (QID) | ORAL | Status: DC | PRN

## 2024-04-05 MED ORDER — ONDANSETRON HCL 4 MG/2ML IJ SOLN
4.0000 mg | Freq: Four times a day (QID) | INTRAMUSCULAR | Status: DC | PRN
  Administered 2024-04-05: 4 mg via INTRAVENOUS
  Filled 2024-04-05: qty 2

## 2024-04-05 MED ORDER — PANTOPRAZOLE SODIUM 40 MG PO TBEC
40.0000 mg | DELAYED_RELEASE_TABLET | Freq: Every day | ORAL | Status: DC
  Administered 2024-04-05 – 2024-04-06 (×2): 40 mg via ORAL
  Filled 2024-04-05 (×3): qty 1

## 2024-04-05 MED ORDER — WARFARIN SODIUM 5 MG PO TABS: 5.0000 mg | ORAL_TABLET | Freq: Every day | ORAL | Status: DC

## 2024-04-05 MED ORDER — UBROGEPANT 100 MG PO TABS: 100.0000 mg | ORAL_TABLET | Freq: Every day | ORAL | Status: DC | PRN

## 2024-04-05 MED ORDER — SODIUM CHLORIDE 0.9 % IV SOLN: INTRAVENOUS | Status: DC

## 2024-04-05 MED ORDER — FLUOXETINE HCL 20 MG PO CAPS
40.0000 mg | ORAL_CAPSULE | Freq: Two times a day (BID) | ORAL | Status: DC
  Administered 2024-04-05 – 2024-04-06 (×3): 40 mg via ORAL
  Filled 2024-04-05 (×4): qty 2

## 2024-04-05 MED ORDER — FAMOTIDINE 20 MG PO TABS
20.0000 mg | ORAL_TABLET | Freq: Every day | ORAL | Status: DC
  Administered 2024-04-05 – 2024-04-06 (×2): 20 mg via ORAL
  Filled 2024-04-05 (×2): qty 1

## 2024-04-05 MED ORDER — TRAZODONE HCL 150 MG PO TABS
150.0000 mg | ORAL_TABLET | Freq: Every day | ORAL | Status: DC
  Administered 2024-04-05: 150 mg via ORAL
  Filled 2024-04-05: qty 1

## 2024-04-05 MED ORDER — ASPIRIN 81 MG PO CHEW
CHEWABLE_TABLET | ORAL | Status: DC | PRN
  Administered 2024-04-05: 81 mg via ORAL

## 2024-04-05 MED ORDER — ALPRAZOLAM 0.25 MG PO TABS: 1.0000 mg | ORAL_TABLET | Freq: Two times a day (BID) | ORAL | Status: DC | PRN

## 2024-04-05 MED ORDER — MIRABEGRON ER 25 MG PO TB24
50.0000 mg | ORAL_TABLET | Freq: Every evening | ORAL | Status: DC
  Administered 2024-04-05: 50 mg via ORAL
  Filled 2024-04-05: qty 1
  Filled 2024-04-05: qty 2

## 2024-04-05 MED ORDER — MIDAZOLAM HCL 2 MG/2ML IJ SOLN
INTRAMUSCULAR | Status: AC
  Filled 2024-04-05: qty 2

## 2024-04-05 MED ORDER — FENTANYL CITRATE (PF) 100 MCG/2ML IJ SOLN
INTRAMUSCULAR | Status: DC | PRN
  Administered 2024-04-05 (×2): 50 ug via INTRAVENOUS

## 2024-04-05 MED ORDER — HYDROMORPHONE HCL 1 MG/ML IJ SOLN
INTRAMUSCULAR | Status: AC
Start: 2024-04-05 — End: 2024-04-06
  Filled 2024-04-05: qty 1

## 2024-04-05 NOTE — Progress Notes (Signed)
 Pt requested to go to bathroom was transferred to bed side commode , after sitting for a few minutes pt states that she does not feel well , felt a migraine coming on was nauseated and visibly diaphoretic . Pt was transferred back to bed x 2 assist , denied any dizziness . Vitals stable, bg 118. Gave prn zofran  and adjusted temp in room. Vascular site clean, dry, intact. Provider on call notified no new orders or interventions at this time, will continue to monitor . Bed alarm set with call bell in reach . Will continue to monitor. Advised pt to call if anything changes or if symptoms worsen. Reports already feeling better from the above.   04/05/24 1946  Vitals  Temp (!) 97.5 F (36.4 C)  Temp Source Oral  BP (!) 149/99  MAP (mmHg) 113  BP Location Left Arm  BP Method Automatic  Patient Position (if appropriate) Lying  Pulse Rate (!) 55  Pulse Rate Source Monitor  Level of Consciousness  Level of Consciousness Alert  MEWS COLOR  MEWS Score Color Green  Oxygen Therapy  SpO2 94 %  O2 Device Nasal Cannula  O2 Flow Rate (L/min) 2 L/min  MEWS Score  MEWS Temp 0  MEWS Systolic 0  MEWS Pulse 0  MEWS RR 1  MEWS LOC 0  MEWS Score 1

## 2024-04-05 NOTE — Progress Notes (Signed)
 Patient arrived at the unit from Cath lab,upon arrival pt is alert and oriented X4,lt leg wrapped with coban,CHG bath given,vitals checked,pt oriented to the unit

## 2024-04-05 NOTE — H&P (Signed)
 HPI:   Jane [name] is a 58 y.o. 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  She has been placed on Coumadin  her last visit her INR was subtherapeutic.  Now she states that she has been supratherapeutic and has easy bruising.  She has left knee pain wearing a brace today but also has significant swelling in the left lower extremity.  She is actually wearing a compression stocking that she was fitted for last time.  She states that her leg swelling is worse at the end of the day.       Past Medical History:  Diagnosis Date   ADD (attention deficit disorder)     Allergy     Anxiety     Arthritis 2021   Back pain     Bilateral swelling of feet     Bipolar 1 disorder (HCC)     Bipolar 1 disorder (HCC)     Bipolar disorder (HCC)     Blood transfusion without reported diagnosis 2020   Chewing difficulty     Chronic fatigue syndrome     Chronic kidney disease      Stage 3 kidney disease;dx by Dr. Mariea Shook.    Clotting disorder (HCC)      I'm on a blood  thinner   Constipation     Depression 2000   Diabetes mellitus without complication (HCC)      diet controlled   Dyspnea      with exertion   GERD (gastroesophageal reflux disease)     Headache      migraines   High cholesterol     History of blood clots     History of DVT (deep vein thrombosis)      left leg   Hypertension      states under control with meds., has been on med. x 2 years   Hypothyroidism     Joint pain     Neuromuscular disorder (HCC)     Neuropathy     Obsessive-compulsive disorder     Peripheral vascular disease (HCC)     Prediabetes     Respiratory failure requiring intubation (HCC)     Restless leg syndrome     Shortness of breath      Sleep apnea     Thrombocytopenia (HCC) 09/15/2019   Trigger thumb of left hand 01/2018   Trigger thumb of right hand               Family History  Problem Relation Age of Onset   Heart disease Mother     Hyperlipidemia Mother     Hypertension Mother     Bipolar disorder Mother     Stroke Mother     Depression Mother     Sleep apnea Mother     Obesity Mother     Diabetes Father     Heart disease Father     Hyperlipidemia Father     Hypertension Father     Sleep apnea Father     Obesity Father     Drug abuse Daughter     ADD / ADHD Daughter     Drug abuse Daughter     Anxiety disorder Daughter     Bipolar disorder Daughter     Hypertension Sister  Hypertension Brother     Hyperlipidemia Brother     Heart disease Brother     Early death Brother     Kidney disease Brother     Bipolar disorder Maternal Aunt     Suicidality Maternal Aunt               Past Surgical History:  Procedure Laterality Date   ABDOMINAL HYSTERECTOMY   06/2016    complete   APPLICATION OF WOUND VAC Left 04/27/2019    Procedure: APPLICATION OF WOUND VAC LEFT GROIN;  Surgeon: Adine Hoof, MD;  Location: [facility] OR;  Service: Vascular;  Laterality: Left;   AV FISTULA PLACEMENT Left 11/24/2018    Procedure: ARTERIOVENOUS (AV) FISTULA CREATION LEFT SFA TO LEFT FEMORAL VEIN;  Surgeon: Adine Hoof, MD;  Location: [facility] OR;  Service: Vascular;  Laterality: Left;   BACK SURGERY       CHOLECYSTECTOMY       COLONOSCOPY WITH PROPOFOL  N/A 11/22/2019    Procedure: COLONOSCOPY WITH PROPOFOL ;  Surgeon: Luke Salaam, MD;  Location: [facility] ENDOSCOPY;  Service: Gastroenterology;  Laterality: N/A;   COLONOSCOPY WITH PROPOFOL  N/A 12/09/2022    Procedure: COLONOSCOPY WITH PROPOFOL ;  Surgeon: Luke Salaam, MD;  Location: [facility] ENDOSCOPY;  Service: Gastroenterology;  Laterality: N/A;   FEMORAL ARTERY EXPLORATION   03/30/2019    Procedure: Left Common Femoral Artery and Vein Exploration;  Surgeon:  Adine Hoof, MD;  Location: [facility] OR;  Service: Vascular;;   FEMORAL-FEMORAL BYPASS GRAFT Left 11/24/2018    Procedure: BYPASS GRAFT FEMORAL-FEMORAL VENOUS LEFT TO RIGHT PALMA PROCEDURE USING CRYOVEIN;  Surgeon: Adine Hoof, MD;  Location: [facility] OR;  Service: Vascular;  Laterality: Left;   GROIN DEBRIDEMENT Left 04/27/2019    Procedure: Arnetha Bhat DEBRIDEMENT;  Surgeon: Adine Hoof, MD;  Location: [facility] OR;  Service: Vascular;  Laterality: Left;   INSERTION OF ILIAC STENT   03/30/2019    Procedure: Stent of left common, external iliac veins and left common femoral vein;  Surgeon: Adine Hoof, MD;  Location: [facility] OR;  Service: Vascular;;   KNEE ARTHROSCOPY WITH MENISCAL REPAIR Left 11/14/2020    Procedure: LEFT KNEE ARTHROSCOPY WITH PARTIAL MEDIAL MENISCECTOMY;  Surgeon: Darrin Emerald, MD;  Location: [facility] ORS;  Service: Orthopedics;  Laterality: Left;   LOWER EXTREMITY VENOGRAPHY N/A 08/17/2018    Procedure: LOWER EXTREMITY VENOGRAPHY - Central Venogram;  Surgeon: Adine Hoof, MD;  Location: [facility] INVASIVE CV LAB;  Service: Cardiovascular;  Laterality: N/A;   LOWER EXTREMITY VENOGRAPHY Bilateral 03/09/2019    Procedure: LOWER EXTREMITY VENOGRAPHY;  Surgeon: Adine Hoof, MD;  Location: [facility] INVASIVE CV LAB;  Service: Cardiovascular;  Laterality: Bilateral;   LOWER EXTREMITY VENOGRAPHY Left 08/16/2019    Procedure: LOWER EXTREMITY VENOGRAPHY;  Surgeon: Adine Hoof, MD;  Location: [facility] INVASIVE CV LAB;  Service: Cardiovascular;  Laterality: Left;   LOWER EXTREMITY VENOGRAPHY Left 03/25/2022    Procedure: LOWER EXTREMITY VENOGRAPHY;  Surgeon: Adine Hoof, MD;  Location: [facility] INVASIVE CV LAB;  Service: Cardiovascular;  Laterality: Left;  IVUS   LUMBAR FUSION   11/21/2000    L5-S1   LUMBAR SPINE SURGERY        x 2 others   PATCH ANGIOPLASTY Left 03/30/2019    Procedure: Patch Angioplasty of the Left Common  Femoral Vein using Venosure Biologic patch;  Surgeon: Adine Hoof, MD;  Location: [facility] OR;  Service: Vascular;  Laterality: Left;   PERIPHERAL VASCULAR INTERVENTION Left 08/16/2019  Procedure: PERIPHERAL VASCULAR INTERVENTION;  Surgeon: Adine Hoof, MD;  Location: [facility] INVASIVE CV LAB;  Service: Cardiovascular;  Laterality: Left;  common femoral/femoral vein stent   PERIPHERAL VASCULAR INTERVENTION Left 03/25/2022    Procedure: PERIPHERAL VASCULAR INTERVENTION;  Surgeon: Adine Hoof, MD;  Location: [facility] INVASIVE CV LAB;  Service: Cardiovascular;  Laterality: Left;  COMMON FEMORAL VEIN   PERIPHERAL VASCULAR THROMBECTOMY Left 03/25/2022    Procedure: PERIPHERAL VASCULAR THROMBECTOMY;  Surgeon: Adine Hoof, MD;  Location: [facility] INVASIVE CV LAB;  Service: Cardiovascular;  Laterality: Left;  EXTREMITY/IVC   SPINE SURGERY       TRIGGER FINGER RELEASE Right 12/01/2017    Procedure: RELEASE TRIGGER FINGER/A-1 PULLEY RIGHT THUMB;  Surgeon: Brunilda Capra, MD;  Location: [facility];  Service: Orthopedics;  Laterality: Right;   TRIGGER FINGER RELEASE Left 01/26/2018    Procedure: LEFT TRIGGER THUMB RELEASE;  Surgeon: Brunilda Capra, MD;  Location:  SURGERY CENTER;  Service: Orthopedics;  Laterality: Left;   ULTRASOUND GUIDANCE FOR VASCULAR ACCESS Right 03/30/2019    Procedure: Ultrasound-guided cannulation right internal jugular vein;  Surgeon: Adine Hoof, MD;  Location: [facility] OR;  Service: Vascular;  Laterality: Right;          Short Social History:  Social History        Tobacco Use   Smoking status: Never   Smokeless tobacco: Never  Substance Use Topics   Alcohol use: Never      Allergies       Allergies  Allergen Reactions   Aripiprazole Other (See Comments)      BECOMES  VIOLENT     Seroquel  [Quetiapine  Fumarate] Other (See Comments)      BECOMES VIOLENT   Chlorpromazine  Other (See Comments)      SEVERE  ANXIETY     Gabapentin  Other (See Comments)      NIGHTMARES     Prednisone  Hives   Quetiapine                 Current Outpatient Medications  Medication Sig Dispense Refill   AJOVY 225 MG/1.5ML SOAJ Inject 1 mL into the skin every 30 (thirty) days.       ALPRAZolam  (XANAX ) 1 MG tablet Take 1 tablet (1 mg total) by mouth 2 (two) times daily as needed. for anxiety 60 tablet 2   amphetamine -dextroamphetamine  (ADDERALL) 30 MG tablet Take 1 tablet by mouth 2 (two) times daily. 60 tablet 0   amphetamine -dextroamphetamine  (ADDERALL) 30 MG tablet Take 1 tablet by mouth 2 (two) times daily. 60 tablet 0   amphetamine -dextroamphetamine  (ADDERALL) 30 MG tablet Take 1 tablet by mouth 2 (two) times daily. 60 tablet 0   amphetamine -dextroamphetamine  (ADDERALL) 30 MG tablet Take 1 tablet by mouth 2 (two) times daily. 60 tablet 0   aspirin  EC 81 MG tablet Take 81 mg by mouth daily.       atorvastatin  (LIPITOR) 40 MG tablet Take 1 tablet (40 mg total) by mouth daily. 90 tablet 3   cetirizine  (ZYRTEC ) 10 MG tablet Take 1 tablet (10 mg total) by mouth daily. 90 tablet 3   chlorhexidine  (PERIDEX ) 0.12 % solution Use as directed 5 mLs in the mouth or throat 4 (four) times daily.       cromolyn (OPTICROM) 4 % ophthalmic solution Place 1 drop into both eyes 2 (two) times daily.       estradiol  (ESTRACE  VAGINAL) 0.1 MG/GM vaginal cream Apply 0.5mg  (pea-sized amount)  just inside the vaginal introitus with  a finger-tip on Monday, Wednesday and Friday nights. 30 g 12   famotidine  (PEPCID ) 20 MG tablet Take 20 mg by mouth daily.       FLUoxetine  (PROZAC ) 40 MG capsule Take 1 capsule (40 mg total) by mouth 2 (two) times daily. 180 capsule 2   GARLIC PO Take 1 tablet by mouth daily.       HYDROcodone -acetaminophen  (NORCO) 10-325 MG tablet Take 1 tablet by mouth every 6 (six) hours as needed. To last 30 days from fill date 120 tablet 0   ketoconazole  (NIZORAL ) 2 % cream Apply to affected areas rash in body folds once  to twice daily until improved. May use daily as a preventative. 60 g 3   levothyroxine  (SYNTHROID ) 125 MCG tablet Take 1 tablet (125 mcg total) by mouth daily before breakfast. 90 tablet 3   linaclotide  (LINZESS ) 290 MCG CAPS capsule Take 1 capsule (290 mcg total) by mouth daily. 30 capsule 2   lisinopril  (ZESTRIL ) 10 MG tablet Take 1 tablet (10 mg total) by mouth daily. 90 tablet 1   methocarbamol  (ROBAXIN ) 750 MG tablet Take 1 tablet (750 mg total) by mouth 2 (two) times daily as needed for muscle spasms. 60 tablet 5   Multiple Vitamin (MULTIVITAMIN) capsule Take 1 capsule by mouth daily.       mupirocin  ointment (BACTROBAN ) 2 % Apply to open areas/sores twice daily until healed. 22 g 3   nitrofurantoin  (MACRODANTIN ) 100 MG capsule Take 1 capsule (100 mg total) by mouth daily. 30 capsule 11   omega-3 acid ethyl esters (LOVAZA) 1 g capsule Take by mouth.       oxybutynin  (DITROPAN  XL) 10 MG 24 hr tablet Take 1 tablet (10 mg total) by mouth daily. 30 tablet 11   pantoprazole  (PROTONIX ) 40 MG tablet Take 1 tablet (40 mg total) by mouth daily. 90 tablet 3   pimecrolimus  (ELIDEL ) 1 % cream Apply to affected areas rash in body folds and legs once to twice daily until improved. 30 g 3   polyethylene glycol powder (GLYCOLAX /MIRALAX ) 17 GM/SCOOP powder TAKE 17 Gram BY MOUTH DAILY 850 g 1   potassium chloride  (KLOR-CON  M) 10 MEQ tablet Take 1 tablet (10 mEq total) by mouth 3 (three) times daily. 270 tablet 3   pregabalin  (LYRICA ) 100 MG capsule TAKE ONE CAPSULE BY MOUTH FOUR TIMES DAILY 120 capsule 5   RESTASIS  0.05 % ophthalmic emulsion Place 1 drop into both eyes 2 (two) times daily.       rizatriptan (MAXALT-MLT) 10 MG disintegrating tablet Take 10 mg by mouth as needed for migraine.       rOPINIRole  (REQUIP ) 2 MG tablet TAKE 1 TABLET BY MOUTH TWICE DAILY 180 tablet 0   Semaglutide , 1 MG/DOSE, 4 MG/3ML SOPN Inject 1 mg as directed once a week. 9 mL 0   traZODone  (DESYREL ) 150 MG tablet Take 1 tablet  (150 mg total) by mouth at bedtime. 30 tablet 2   valACYclovir  (VALTREX ) 1000 MG tablet Take 2 tablets by mouth at first onset of fever blisters, then take 2 tablets by mouth 12 hours later. 30 tablet 3   Vibegron  (GEMTESA ) 75 MG TABS Take 1 tablet (75 mg total) by mouth daily. 90 tablet 3   warfarin (COUMADIN ) 1 MG tablet Take one  along with 5 mg pill daily except for Mondays take 2 pills with 5 mg pill.  Equal to 6 mg daily except 7 mg on Monday. 45 tablet 1   warfarin (COUMADIN ) 5  MG tablet Take 7.5 mg daily except 5mg  on Saturday and Sunday. 45 tablet 11      No current facility-administered medications for this visit.        Review of Systems  Constitutional:  Constitutional negative. HENT: HENT negative.  Eyes: Eyes negative.  Respiratory: Respiratory negative.  Cardiovascular: Positive for leg swelling.  GI: Gastrointestinal negative.  Musculoskeletal: Positive for gait problem, leg pain and joint pain.  Skin: Skin negative.  Hematologic: Positive for bruises/bleeds easily.  Psychiatric: Psychiatric negative.          Objective:   Vitals:   04/05/24 0537  BP: 133/74  Pulse: (!) 56  Resp: 20  Temp: 98.1 F (36.7 C)  SpO2: 96%      Physical Exam HENT:     Head: Normocephalic.  Pulmonary:     Effort: Pulmonary effort is normal.  Abdominal:     General: Abdomen is flat.  Musculoskeletal:     Comments: Wearing compression left lower extremity Right lower extremity measures 37 cm, left lower extremity measures 41 cm (measured 10 cm from tibial tuberosity)  Skin:    Comments: Bruise on right forearm  Neurological:     General: No focal deficit present.     Mental Status: She is alert.  Psychiatric:        Mood and Affect: Mood normal.        Data: CTV IMPRESSION: 1. The extensive overlapping left-sided iliofemoral stents extending from the left common iliac origin to the level of the proximal left femoral vein demonstrate poor internal opacification and  may be chronically occluded. Slow internal flow cannot be excluded by CT. 2. Below the stented femoral vein, the native femoral vein in the proximal to mid left thigh appears atretic. There are numerous superficial collateral veins communicating with the great saphenous vein and extending over the pubic region and left abdominal wall. 3. Right iliac veins are normally patent. 4. Status post cholecystectomy with stable dilatation of the common bile duct post cholecystectomy measuring up to 11 mm. 5. Stable degenerative disc disease of the lumbar spine with prior cage fusion at L5-S1.        Assessment/Plan:    58 year old female with extensive previous left lower extremity venous history now with and 10% larger left leg despite having compression stocking on.  Her leg is stable in size from last visit and the smallest I have ever seen her left lower extremity was 38.5 cm 8 months ago when the stents were noted to be occluded.    Her leg actually looks quite improved today she has been elevating it and compliant with her compression stockings.  Her INR is therapeutic.  Will plan for ascending venography.  I am unsure what our options will be and she may need extensive surgery for venous decompression of the left lower extremity of which I am not sure if she would be a great candidate.  Thankfully she looks quite well compensated for occluded left common femoral vein today and she demonstrates good understanding of our procedural plan.       Adine Hoof MD [facility]

## 2024-04-05 NOTE — Progress Notes (Signed)
 PHARMACY - ANTICOAGULATION CONSULT NOTE  Pharmacy Consult for warfarin Indication: History of blood clots   Allergies  Allergen Reactions   Aripiprazole Other (See Comments)    BECOMES  VIOLENT    Seroquel  [Quetiapine  Fumarate] Other (See Comments)    BECOMES VIOLENT   Chlorpromazine  Other (See Comments)    SEVERE ANXIETY    Gabapentin  Other (See Comments)    NIGHTMARES    Prednisone  Hives    Patient Measurements: Height: 5\' 9"  (175.3 cm) Weight: 104.3 kg (230 lb) IBW/kg (Calculated) : 66.2 HEPARIN  DW (KG): 89.2  Vital Signs: Temp: 98.4 F (36.9 C) (06/02 1432) Temp Source: Oral (06/02 1432) BP: 117/77 (06/02 1432) Pulse Rate: 58 (06/02 1451)  Labs: Recent Labs    04/05/24 0547 04/05/24 0552  HGB 12.2  --   HCT 36.0  --   LABPROT  --  25.4*  INR  --  2.3*  CREATININE 1.40*  --     Estimated Creatinine Clearance: 57 mL/min (A) (by C-G formula based on SCr of 1.4 mg/dL (H)).   Medical History: Past Medical History:  Diagnosis Date   ADD (attention deficit disorder)    Allergy    Anxiety    Arthritis 2021   Back pain    Bilateral swelling of feet    Bipolar 1 disorder (HCC)    Bipolar 1 disorder (HCC)    Bipolar disorder (HCC)    Blood transfusion without reported diagnosis 2020   Chewing difficulty    Chronic fatigue syndrome    Chronic kidney disease    Stage 3 kidney disease;dx by Dr. Mariea Shook.    Clotting disorder (HCC)    I'm on a blood  thinner   Constipation    Depression 2000   Diabetes mellitus without complication (HCC)    diet controlled   Dyspnea    with exertion   GERD (gastroesophageal reflux disease)    Headache    migraines   High cholesterol    History of blood clots    History of DVT (deep vein thrombosis)    left leg   Hypertension    states under control with meds., has been on med. x 2 years   Hypothyroidism    Joint pain    Neuromuscular disorder (HCC)    Neuropathy    Obsessive-compulsive disorder     Peripheral vascular disease (HCC)    Prediabetes    Respiratory failure requiring intubation (HCC)    Restless leg syndrome    Shortness of breath    Sleep apnea    Thrombocytopenia (HCC) 09/15/2019   Trigger thumb of left hand 01/2018   Trigger thumb of right hand       Assessment: 58 year old female with extensive previous left lower extremity venous history on warfarin prior to admission. Last dose PTA 6/1 INR therapeutic 2.3  Prior to admission regimen: Warfarin 6mg  daily except warfarin 7mg  on mondays  Goal of Therapy:  INR 2-3 Monitor platelets by anticoagulation protocol: Yes   Plan:  Warfarin 7mg  PO x1 tonight Daily INR, CBC Monitor for s/sx of bleeding  Mohammed Andrew, PharmD Clinical Pharmacist 04/05/2024 3:21 PM Please check AMION for all [facility] Pharmacy numbers

## 2024-04-05 NOTE — H&P (Signed)
 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 Vikki Graves, MD Procedure Performed: 1.  Percutaneous ultrasound-guided access left small saphenous vein 2.  Left lower extremity venogram including left femoral, left common femoral, left external and common iliac veins and IVC 3.  Catheter selection of IVC 4.  Plain balloon angioplasty left common iliac vein in-stent occlusion with 12 mm balloon 5.  Stent of left external and common femoral vein with 14 x 120 mm Abre 6.  Percutaneous mechanical thrombectomy with Inari Clottriever left common and external iliac vein and common femoral stents and left femoral vein 7.  Plain balloon angioplasty left femoral vein with 8 mm balloon 8. Moderate sedation with fentanyl  and Versed  for 88 minutes   Indications: 58 year old female with extensive left lower extremity venous history initially post thrombotic syndrome from May Thurner syndrome status post open and endovascular revascularizations now with known occluded left common and external iliac vein stents as well as common femoral vein stent with atretic and occluded left femoral vein by most recent CT venogram.  She has been maintained on Coumadin  and most recently has been therapeutic.  She is not a candidate for ascending venography with possible invention of the left lower extremity.  Findings: The small saphenous vein was accessed in the popliteal and femoral vein were then noted to be patent but there was more brisk flow through the profunda that was unfortunately jailed by the stent in the common femoral.  Flow in the mid femoral then diverted into the great saphenous vein which was also jailed but used crossover collaterals to fill the pelvis.  The stents were occluded however towards  the more cephalad aspect in the common iliac vein stent there was a channel.  We were fortunately able to get through all the stents and confirmed intraluminal access with intravascular ultrasound.  Intravascular ultrasound demonstrated patency of the IVC and then there was the subtotally occluded common iliac vein stent with occlusion of the external iliac vein and common femoral vein stents and again occlusion of the femoral vein as demonstrated by Nagra feet.  We initially performed balloon venoplasty unfortunately with no flow and then perform percutaneous mechanical thrombectomy from the common iliac vein stent through the external iliac vein stent and common femoral vein stent in the femoral vein.  This created a channel in the femoral vein as we retrieved significant chronic thrombosis with 3 separate passes.  Unfortunately we did catch the and artery on the external iliac vein stent and this was damaged and for that reason we placed a new stent in the external and common femoral vein this was a 14 x 120 mm stent postdilated with a 12 mm balloon.  At completion there was some disease noted in the femoral vein although was nonflow limiting and there was approximately 20% residual stenosis in the common iliac vein stent but the new external and common femoral vein stents were patent and there was no flow into the great saphenous vein as flow was primarily through the main channel.    Plan will be to return the patient to Coumadin  and aspirin  and continue compression stockings.   Procedure:  The patient was identified in the holding area and taken to room 8.  The patient was then placed prone on the table and prepped and draped in the  usual sterile fashion.  A time out was called.  Ultrasound was used to evaluate the left small saphenous vein which was noted to be patent and compressible.  It was somewhat thickened.  The area was anesthetized 1% lidocaine  and cannulated with a micropuncture needle followed  by wire and a sheath.  Concomitantly we administered fentanyl  and Versed  as moderate sedation her vital signs were monitored throughout the case.  We performed ascending venography and without we placed the Glidewire and then a 5 [place] sheath.  5000 heparin  was initially administered and ultimately an additional 5000 was administered prior to stenting.  We were fortunately able to get a Glidewire and Elmyra Haggard cross catheter to initiate through the stents and then placed a quick cross catheter and were able to gain intraluminal access to the IVC and and placed a Rosen wire and ultimately an Amplatz wire into the left innominate vein under fluoroscopic guidance and this was marked on the back table.  We then performed intravascular ultrasound with the above findings and began with ballooning initially of the femoral vein with 8 mm balloon followed by the common femoral and external iliac and common iliac vein stents with 12 mm balloon.  Completion demonstrated unfortunately no brisk flow through these areas.  We again performed intravascular ultrasound and elected for Inari mechanical thrombectomy.  We then placed the Inari sheath over the Amplatz wire and then perform mechanical thrombectomy initially of the femoral vein and return significant chronic thrombus and then placed this up into the stents that were able to get thrombus from the common iliac vein but then unfortunately caught on the extrailiac vein stent we were able to retrieve the NR device but this appeared to damage to the external iliac vein stent.  This was confirmed with intravascular ultrasound.  We then primarily stented this area bridging from the common iliac vein stent down to the common femoral vein stent using a 14 x 120 mm stent postdilated with 12 mm balloon.  Completion demonstrated now brisk flow through the femoral vein into the common femoral vein stent extending through the external and common iliac vein stents and into the IVC and this  was all confirmed with IVUS although there was somewhat residual stenosis approximately 20% noted in the common iliac vein stent but was resistant to ballooning and I did not want to pass mechanical thrombectomy again.  Satisfied with this we then suture-ligated the cannulation site and the sheath and wire were removed.  She tolerated the procedure without any complication.   Contrast: 65 cc  Javarian Jakubiak C. Vikki Graves, MD [facility] Office: [phone] Pager: [phone]

## 2024-04-06 DIAGNOSIS — I871 Compression of vein: Secondary | ICD-10-CM | POA: Diagnosis not present

## 2024-04-06 LAB — BASIC METABOLIC PANEL WITH GFR
Anion gap: 5 (ref 5–15)
BUN: 22 mg/dL — ABNORMAL HIGH (ref 6–20)
CO2: 29 mmol/L (ref 22–32)
Calcium: 8.9 mg/dL (ref 8.9–10.3)
Chloride: 105 mmol/L (ref 98–111)
Creatinine, Ser: 1.43 mg/dL — ABNORMAL HIGH (ref 0.44–1.00)
GFR, Estimated: 43 mL/min — ABNORMAL LOW (ref >60)
Glucose, Bld: 135 mg/dL — ABNORMAL HIGH (ref 70–99)
Potassium: 4.8 mmol/L (ref 3.5–5.1)
Sodium: 139 mmol/L (ref 135–145)

## 2024-04-06 LAB — PROTIME-INR
INR: 2.5 — ABNORMAL HIGH (ref 0.8–1.2)
Prothrombin Time: 26.9 s — ABNORMAL HIGH (ref 11.4–15.2)

## 2024-04-06 MED ORDER — METOCLOPRAMIDE HCL 5 MG/ML IJ SOLN: 5.0000 mg | Freq: Four times a day (QID) | INTRAMUSCULAR | Status: DC

## 2024-04-06 MED ORDER — METOCLOPRAMIDE HCL 5 MG/ML IJ SOLN
5.0000 mg | Freq: Three times a day (TID) | INTRAMUSCULAR | Status: DC | PRN
  Administered 2024-04-06: 5 mg via INTRAVENOUS
  Filled 2024-04-06: qty 2

## 2024-04-06 MED ORDER — WARFARIN SODIUM 5 MG PO TABS: 6.0000 mg | ORAL_TABLET | Freq: Once | ORAL | Status: DC

## 2024-04-06 NOTE — Progress Notes (Signed)
 Discharge education given to patient ,warfarin dose confirmed with pharmacy,iv removed,volunteers called for the ride till downstairs

## 2024-04-06 NOTE — Progress Notes (Signed)
 Mobility Specialist Progress Note:    04/06/24 0955  Mobility  Activity Ambulated independently in hallway;Ambulated independently in room  Level of Assistance Standby assist, set-up cues, supervision of patient - no hands on  Assistive Device None  Distance Ambulated (ft) 260 ft  Activity Response Tolerated well  Mobility Referral Yes  Mobility visit 1 Mobility  Mobility Specialist Start Time (ACUTE ONLY) [id number]  Mobility Specialist Stop Time (ACUTE ONLY) 0944  Mobility Specialist Time Calculation (min) (ACUTE ONLY) 8 min   Pt received in bed, agreeable to mobility session. Ambulated in hallway, no AD required. Tolerated well, asx throughout. Returned pt to room, eager for d/c.    Kobe Jansma Mobility Specialist Please contact via SecureChat or  Rehab office at [phone]

## 2024-04-06 NOTE — Plan of Care (Signed)

## 2024-04-06 NOTE — Progress Notes (Signed)
 PHARMACY - ANTICOAGULATION CONSULT NOTE  Pharmacy Consult for warfarin Indication: History of blood clots   Allergies  Allergen Reactions   Aripiprazole Other (See Comments)    BECOMES  VIOLENT    Seroquel  [Quetiapine  Fumarate] Other (See Comments)    BECOMES VIOLENT   Chlorpromazine  Other (See Comments)    SEVERE ANXIETY    Gabapentin  Other (See Comments)    NIGHTMARES    Prednisone  Hives    Patient Measurements: Height: 5\' 9"  (175.3 cm) Weight: 104.3 kg (230 lb) IBW/kg (Calculated) : 66.2 HEPARIN  DW (KG): 89.2  Vital Signs: Temp: 97.7 F (36.5 C) (06/03 0732) Temp Source: Oral (06/03 0732) BP: 132/70 (06/03 0732) Pulse Rate: 74 (06/03 0732)  Labs: Recent Labs    04/05/24 0547 04/05/24 0552 04/05/24 1539 04/06/24 0309  HGB 12.2  --  11.8*  --   HCT 36.0  --  37.3  --   PLT  --   --  98*  --   LABPROT  --  25.4*  --  26.9*  INR  --  2.3*  --  2.5*  CREATININE 1.40*  --   --  1.43*    Estimated Creatinine Clearance: 55.8 mL/min (A) (by C-G formula based on SCr of 1.43 mg/dL (H)).   Medical History: Past Medical History:  Diagnosis Date   ADD (attention deficit disorder)    Allergy    Anxiety    Arthritis 2021   Back pain    Bilateral swelling of feet    Bipolar 1 disorder (HCC)    Bipolar 1 disorder (HCC)    Bipolar disorder (HCC)    Blood transfusion without reported diagnosis 2020   Chewing difficulty    Chronic fatigue syndrome    Chronic kidney disease    Stage 3 kidney disease;dx by Dr. Mariea Shook.    Clotting disorder (HCC)    I'm on a blood  thinner   Constipation    Depression 2000   Diabetes mellitus without complication (HCC)    diet controlled   Dyspnea    with exertion   GERD (gastroesophageal reflux disease)    Headache    migraines   High cholesterol    History of blood clots    History of DVT (deep vein thrombosis)    left leg   Hypertension    states under control with meds., has been on med. x 2 years    Hypothyroidism    Joint pain    Neuromuscular disorder (HCC)    Neuropathy    Obsessive-compulsive disorder    Peripheral vascular disease (HCC)    Prediabetes    Respiratory failure requiring intubation (HCC)    Restless leg syndrome    Shortness of breath    Sleep apnea    Thrombocytopenia (HCC) 09/15/2019   Trigger thumb of left hand 01/2018   Trigger thumb of right hand       Assessment: 58 year old female with extensive previous left lower extremity venous history on warfarin prior to admission. Last dose PTA 6/1 INR remains therapeutic 2.5  Prior to admission regimen: Warfarin 6mg  daily except warfarin 7mg  on mondays  Goal of Therapy:  INR 2-3 Monitor platelets by anticoagulation protocol: Yes   Plan:  Warfarin 6mg  PO x1 tonight Daily INR, CBC Monitor for s/sx of bleeding  Mohammed Andrew, PharmD Clinical Pharmacist 04/06/2024 8:27 AM Please check AMION for all [facility] Pharmacy numbers

## 2024-04-06 NOTE — Progress Notes (Addendum)
 Progress Note    04/06/2024 7:24 AM 1 Day Post-Op  Subjective:  she says her left leg swelling feels a little bit better    Vitals:   04/06/24 0200 04/06/24 0347  BP: (!) 105/58 108/64  Pulse: (!) 56 62  Resp: 12 11  Temp:  98 F (36.7 C)  SpO2: 100% 97%    Physical Exam: General:  resting comfortably Cardiac:  regular Lungs:  nonlabored Incisions:  left small saphenous cannulation site bandaged and dry Extremities:  palpable left DP pulse. Improved LLE edema  CBC    Component Value Date/Time   WBC 5.3 04/05/2024 1539   RBC 3.92 04/05/2024 1539   HGB 11.8 (L) 04/05/2024 1539   HGB 11.6 01/21/2024 1341   HCT 37.3 04/05/2024 1539   HCT 35.2 01/21/2024 1341   PLT 98 (L) 04/05/2024 1539   PLT 130 (L) 01/21/2024 1341   MCV 95.2 04/05/2024 1539   MCV 94 01/21/2024 1341   MCH 30.1 04/05/2024 1539   MCHC 31.6 04/05/2024 1539   RDW 12.8 04/05/2024 1539   RDW 13.0 01/21/2024 1341   LYMPHSABS 0.7 02/27/2024 0944   LYMPHSABS 1.6 01/21/2024 1341   MONOABS 0.6 02/27/2024 0944   EOSABS 0.0 02/27/2024 0944   EOSABS 0.1 01/21/2024 1341   BASOSABS 0.0 02/27/2024 0944   BASOSABS 0.0 01/21/2024 1341    BMET    Component Value Date/Time   NA 139 04/06/2024 0309   NA 142 12/02/2023 1114   K 4.8 04/06/2024 0309   CL 105 04/06/2024 0309   CO2 29 04/06/2024 0309   GLUCOSE 135 (H) 04/06/2024 0309   BUN 22 (H) 04/06/2024 0309   BUN 25 (H) 12/02/2023 1114   CREATININE 1.43 (H) 04/06/2024 0309   CALCIUM  8.9 04/06/2024 0309   GFRNONAA 43 (L) 04/06/2024 0309   GFRAA 53 (L) 07/18/2020 1434    INR    Component Value Date/Time   INR 2.5 (H) 04/06/2024 0309   INR 2.6 02/25/2024 1424     Intake/Output Summary (Last 24 hours) at 04/06/2024 0724 Last data filed at 04/05/2024 2237 Gross per 24 hour  Intake 240 ml  Output 400 ml  Net -160 ml      Assessment/Plan:  58 y.o. female is 1 day post op, s/p: left common and external iliac vein thrombectomy, left common iliac  vein balloon angioplasty, and stenting of left external and common femoral veins   -She is doing well this morning. She endorses some soreness in the left leg and in her back from laying around -I have taken down her compressive wrap this morning and her edema appears improved -LLE well perfused with palpable DP pulse -L small saphenous vein cannulation site is bandaged and dry -She tolerated her warfarin yesterday without any bleeding complications. Appreciate dosing per pharmacy -Will order thigh high compression stockings that she should wear daily to help with swelling -She has not mobilized yet. Likely discharge home today with follow up in 4-6 weeks   Deneise Finlay, [place] [facility] [phone] 04/06/2024 7:24 AM   I have independently interviewed and examined patient and agree with PA assessment and plan above.  Her left lower extremity appears smaller than it has ever been by my exam today with almost no edema.  I have recommended continue her Coumadin  and wearing compression stockings as tolerated and continue activity as tolerated as well.  Will have her see us  in 4 to 6 weeks with noninvasive studies.  Michala Deblanc C. Vikki Graves,  MD [facility] Office: [phone] Pager: [phone]

## 2024-04-07 MED FILL — Lidocaine HCl Local Preservative Free (PF) Inj 1%: INTRAMUSCULAR | Qty: 30 | Status: AC

## 2024-04-09 NOTE — Discharge Summary (Signed)
 Discharge Summary  Patient ID: Jane [name] [id number] 57 y.o. 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 She has been maintained on Coumadin  and most recently has been therapeutic. She is not a candidate for ascending venography with possible invention of the left lower extremity.   Hospital Course: The patient was admitted to the hospital on 04/05/2024 and underwent the following:  1.  Percutaneous ultrasound-guided access left small saphenous vein 2.  Left lower extremity venogram including left femoral, left common femoral, left external and common iliac veins and IVC 3.  Catheter selection of IVC 4.  Plain balloon angioplasty left common iliac vein in-stent occlusion with 12 mm balloon 5.  Stent of left external and common femoral vein with 14 x 120 mm Abre 6.  Percutaneous mechanical thrombectomy with Inari Clottriever left common and external iliac vein and common femoral stents and left femoral vein 7.  Plain balloon angioplasty left femoral vein with 8 mm balloon  She tolerated the procedure well and was transferred to the PACU in stable condition.   On POD1 she was doing well with significantly improved LLE edema. Her left leg was well perfused. She had no bleeding from her saphenous vein  cannulation site. She was tolerating her home Warfarin regime.   She was mobilizing well, tolerating a normal diet, and voiding without difficulty. She was discharged home on POD 1.   Consults: None  Treatments: analgesia: Dilaudid , anticoagulation: heparin  and warfarin, and procedures:   1.  Percutaneous ultrasound-guided access left small saphenous vein 2.  Left lower extremity venogram including left femoral, left common femoral, left external and common iliac veins and IVC 3.  Catheter selection of IVC 4.  Plain balloon angioplasty left common iliac vein in-stent occlusion with 12 mm balloon 5.  Stent of left external and common femoral vein with 14 x 120 mm Abre 6.  Percutaneous mechanical thrombectomy with Inari Clottriever left common and external iliac vein and common femoral stents and left femoral vein 7.  Plain balloon angioplasty left femoral vein with 8 mm balloon  Discharge Exam: See progress note  Vitals:   04/06/24 0347 04/06/24 0732  BP: 108/64 132/70  Pulse: 62 74  Resp: 11 16  Temp: 98 F (36.7 C) 97.7 F (36.5 C)  SpO2: 97% 93%     Disposition: Discharge disposition: 01-Home or Self Care       Patient Instructions:  Allergies as of 04/06/2024       Reactions   Aripiprazole Other (See Comments)   BECOMES  VIOLENT   Seroquel  [quetiapine  Fumarate] Other (See Comments)   BECOMES VIOLENT   Chlorpromazine  Other (See Comments)   SEVERE ANXIETY   Gabapentin  Other (See Comments)   NIGHTMARES   Prednisone  Hives        Medication List     TAKE these medications    Ajovy 225 MG/1.5ML Soaj Generic drug: Fremanezumab-vfrm Inject 1 mL into the  skin every 30 (thirty) days.   ALPRAZolam  1 MG tablet Commonly known as: XANAX  TAKE 1 TABLET BY MOUTH TWICE DAILY AS NEEDED FOR ANXIETY   amphetamine -dextroamphetamine  30 MG tablet Commonly known as: Adderall Take 1 tablet by mouth 2 (two) times daily.   amphetamine -dextroamphetamine  30 MG  tablet Commonly known as: Adderall Take 1 tablet by mouth 2 (two) times daily.   amphetamine -dextroamphetamine  30 MG tablet Commonly known as: ADDERALL Take 1 tablet by mouth 2 (two) times daily.   amphetamine -dextroamphetamine  30 MG tablet Commonly known as: ADDERALL Take 1 tablet by mouth 2 (two) times daily.   aspirin  EC 81 MG tablet Take 81 mg by mouth daily.   atorvastatin  40 MG tablet Commonly known as: LIPITOR Take 1 tablet (40 mg total) by mouth daily.   cetirizine  10 MG tablet Commonly known as: ZYRTEC  Take 1 tablet (10 mg total) by mouth daily.   cromolyn 4 % ophthalmic solution Commonly known as: OPTICROM Place 1 drop into both eyes 2 (two) times daily.   cyclobenzaprine  5 MG tablet Commonly known as: FLEXERIL  Take 1 tablet (5 mg total) by mouth 3 (three) times daily as needed for muscle spasms.   famotidine  20 MG tablet Commonly known as: PEPCID  Take 20 mg by mouth daily.   FLUoxetine  40 MG capsule Commonly known as: PROZAC  Take 1 capsule (40 mg total) by mouth 2 (two) times daily.   HYDROcodone -acetaminophen  10-325 MG tablet Commonly known as: NORCO Take 1 tablet by mouth every 6 (six) hours as needed. To last 30 days from fill date   HYDROcodone -acetaminophen  10-325 MG tablet Commonly known as: NORCO Take 1 tablet by mouth every 6 (six) hours as needed. To last 30 days from fill date Start taking on: April 21, 2024   HYDROcodone -acetaminophen  10-325 MG tablet Commonly known as: NORCO Take 1 tablet by mouth every 6 (six) hours as needed. To last 30 days from fill date Start taking on: May 21, 2024   levothyroxine  125 MCG tablet Commonly known as: SYNTHROID  Take 1 tablet (125 mcg total) by mouth daily before breakfast.   Linzess  290 MCG Caps capsule Generic drug: linaclotide  TAKE ONE CAPSULE BY MOUTH DAILY   mirabegron  ER 50 MG Tb24 tablet Commonly known as: MYRBETRIQ  Take 1 tablet (50 mg total) by mouth daily.   multivitamin capsule Take  1 capsule by mouth daily.   nitrofurantoin  100 MG capsule Commonly known as: Macrodantin  Take 1 capsule (100 mg total) by mouth daily.   OMEGA 3 PO Take 1 capsule by mouth daily.   Ozempic  (1 MG/DOSE) 4 MG/3ML Sopn Generic drug: Semaglutide  (1 MG/DOSE) Inject 1 mg as directed once a week.   pantoprazole  40 MG tablet Commonly known as: PROTONIX  TAKE 1 TABLET BY MOUTH DAILY   pimecrolimus  1 % cream Commonly known as: ELIDEL  Apply to affected areas rash in body folds and legs once to twice daily until improved.   polyethylene glycol powder 17 GM/SCOOP powder Commonly known as: GLYCOLAX /MIRALAX  TAKE 17 Gram BY MOUTH DAILY   potassium chloride  10 MEQ tablet Commonly known as: KLOR-CON  M Take 1 tablet (10 mEq total) by mouth 3 (three) times daily.   pregabalin  100 MG capsule Commonly known as: LYRICA  TAKE ONE CAPSULE BY MOUTH FOUR TIMES DAILY   Restasis  0.05 % ophthalmic emulsion Generic drug: cycloSPORINE  Place 1 drop into both eyes 2 (two) times daily.   rOPINIRole  2 MG tablet Commonly known as: REQUIP  TAKE 1 TABLET BY MOUTH TWICE DAILY   traZODone  150 MG  tablet Commonly known as: DESYREL  Take 1 tablet (150 mg total) by mouth at bedtime.   Ubrelvy 100 MG Tabs Generic drug: Ubrogepant Take 100 mg by mouth daily as needed (migraines). May take a second 100 mg dose 2 hours later as needed for migraines   warfarin 5 MG tablet Commonly known as: COUMADIN  Take as directed. If you are unsure how to take this medication, talk to your nurse or doctor. Original instructions: Take 7.5 mg daily except 5mg  on Saturday and Sunday. What changed:  how much to take how to take this when to take this additional instructions   warfarin 1 MG tablet Commonly known as: COUMADIN  Take as directed. If you are unsure how to take this medication, talk to your nurse or doctor. Original instructions: Take one  along with 5 mg pill daily except for Mondays take 2 pills with 5 mg pill.   Equal to 6 mg daily except 7 mg on Monday. What changed:  how much to take how to take this when to take this additional instructions       Activity: activity as tolerated, no driving while on analgesics, and no heavy lifting for 4 weeks Diet: regular diet Wound Care: keep wound clean and dry  Follow-up with [facility] in 4-6 wks with LLE DVT study and left IVC/iliac vein duplex   Signed: Deneise Finlay, PA-C 04/09/2024 1:09 PM [facility] Office: [phone]

## 2024-04-12 NOTE — Telephone Encounter (Signed)
 Agree with documentation. Discussed with medical student.

## 2024-04-15 ENCOUNTER — Encounter: Payer: Self-pay | Admitting: Family Medicine

## 2024-04-15 NOTE — Telephone Encounter (Signed)
 I haven't gotten a form from them. They usually send it over. If not, we can send the DME order form in for briefs (send ht and wt for size).  1 month supply with 11 refills. Urinary incontinence for diagnosis.

## 2024-04-19 ENCOUNTER — Ambulatory Visit (INDEPENDENT_AMBULATORY_CARE_PROVIDER_SITE_OTHER): Payer: MEDICAID | Admitting: Family Medicine

## 2024-04-20 ENCOUNTER — Telehealth: Payer: Self-pay

## 2024-04-20 NOTE — Telephone Encounter (Signed)
 Copied from CRM [id number]. Topic: Clinical - Prescription Issue >> Apr 20, 2024  1:28 PM Lysbeth Sauger wrote: Reason for CRM: pt needs assigned order sent to [facility]

## 2024-04-21 ENCOUNTER — Telehealth: Payer: Self-pay | Admitting: Family Medicine

## 2024-04-21 ENCOUNTER — Telehealth: Payer: Self-pay

## 2024-04-21 NOTE — Telephone Encounter (Signed)
 Paperwork has been received back from the provider and faxed back to [facility] 04/21/24

## 2024-04-21 NOTE — Telephone Encounter (Signed)
 Sent patient a message through [facility] regarding her appt change. I tried calling several times but the call would not go through.

## 2024-04-26 ENCOUNTER — Telehealth: Payer: Self-pay

## 2024-04-26 NOTE — Telephone Encounter (Signed)
 Do you have one that has een started?   Copied from CRM [id number]. Topic: General - Other >> Apr 26, 2024  8:50 AM Selinda RAMAN wrote: Reason for CRM: The patient called in asking if her provider can have a copy of the FL2 form available to pick up on Wednesday. Please assist patient further.

## 2024-04-27 ENCOUNTER — Other Ambulatory Visit: Payer: Self-pay

## 2024-04-27 ENCOUNTER — Ambulatory Visit (INDEPENDENT_AMBULATORY_CARE_PROVIDER_SITE_OTHER): Payer: MEDICAID | Admitting: Family Medicine

## 2024-04-27 ENCOUNTER — Encounter (INDEPENDENT_AMBULATORY_CARE_PROVIDER_SITE_OTHER): Payer: Self-pay | Admitting: Family Medicine

## 2024-04-27 VITALS — BP 102/64 | HR 64 | Temp 97.9°F | Ht 68.0 in | Wt 233.0 lb

## 2024-04-27 DIAGNOSIS — E1165 Type 2 diabetes mellitus with hyperglycemia: Secondary | ICD-10-CM

## 2024-04-27 DIAGNOSIS — Z6835 Body mass index (BMI) 35.0-35.9, adult: Secondary | ICD-10-CM

## 2024-04-27 DIAGNOSIS — Z7985 Long-term (current) use of injectable non-insulin antidiabetic drugs: Secondary | ICD-10-CM | POA: Diagnosis not present

## 2024-04-27 MED ORDER — SEMAGLUTIDE (1 MG/DOSE) 4 MG/3ML ~~LOC~~ SOPN
1.0000 mg | PEN_INJECTOR | SUBCUTANEOUS | 0 refills | Status: DC
  Filled 2024-04-27: qty 9, 84d supply, fill #0

## 2024-04-27 NOTE — Progress Notes (Signed)
 SUBJECTIVE:  Chief Complaint: Obesity  Interim History: patient has been working out riding a Adult nurse a mile a day.  She is also doing chair aerobics at home. Foodwise she has been doing about 75% of the meal plan.  Since last appointment she got a venograph and got another stent and cleaned out a few arteries and veins.  She was told she needs a left knee replacement. Still looking for a car. She wants to continue her physical activity with being mindful of heat.  She also wants to limit her indulgent food in the house.  Jane [name] is here to discuss her progress with her obesity treatment plan. She is on the Category 3 Plan and states she is following her eating plan approximately 75 % of the time. She states she is exercising riding her trike one mile daily and chair yoga 30 minutes 6 times per week.   OBJECTIVE: Visit Diagnoses: Problem List Items Addressed This Visit       Endocrine   Type 2 diabetes mellitus with hyperglycemia (HCC) - Primary   Doing well on ozempic - needs a refill of ozempic  today.  No change in dose or medication.      Relevant Medications   Semaglutide , 1 MG/DOSE, 4 MG/3ML SOPN     Other   Morbid obesity (HCC)   Anthropometric Measurements Height: 5' 8 (1.727 m) Weight: 233 lb (105.7 kg) BMI (Calculated): 35.44 Weight at Last Visit: 232 lb Weight Lost Since Last Visit: 0 Weight Gained Since Last Visit: 1 lb Starting Weight: 283 lb Total Weight Loss (lbs): 50 lb (22.7 kg) Peak Weight: 290 lb Body Composition  Body Fat %: 49.2 % Fat Mass (lbs): 115 lbs Muscle Mass (lbs): 112.6 lbs Total Body Water (lbs): 95 lbs Visceral Fat Rating : 14 Other Clinical Data Fasting: no Labs: no Today's Visit #: 49 Starting Date: 03/29/20       Relevant Medications   Semaglutide , 1 MG/DOSE, 4 MG/3ML SOPN   Other Visit Diagnoses       BMI 35.0-35.9,adult           No data recorded       04/27/2024    1:00 PM 04/06/2024    7:32 AM 04/06/2024    3:47 AM   Vitals with BMI  Height 5' 8    Weight 233 lbs    BMI 35.44    Systolic 102 132 891  Diastolic 64 70 64  Pulse 64 74 62      ASSESSMENT AND PLAN:  Diet: Jane [name] is currently in the action stage of change. As such, her goal is to continue with weight loss efforts and has agreed to the Category 3 Plan.   Exercise:  For substantial health benefits, adults should do at least 150 minutes (2 hours and 30 minutes) a week of moderate-intensity, or 75 minutes (1 hour and 15 minutes) a week of vigorous-intensity aerobic physical activity, or an equivalent combination of moderate- and vigorous-intensity aerobic activity. Aerobic activity should be performed in episodes of at least 10 minutes, and preferably, it should be spread throughout the week.  Behavior Modification:  We discussed the following Behavioral Modification Strategies today: increasing lean protein intake, decreasing simple carbohydrates, increasing vegetables, avoiding temptations, and planning for success.   Return in about 6 weeks (around 06/08/2024).   She was informed of the importance of frequent follow up visits to maximize her success with intensive lifestyle modifications for her multiple health conditions.  Attestation Statements:  Reviewed by clinician on day of visit: allergies, medications, problem list, medical history, surgical history, family history, social history, and previous encounter notes.     Jane Cho, MD

## 2024-04-27 NOTE — Telephone Encounter (Signed)
 I don't have any info previously about her needing an FL2. I couldn't find one in the chart, can you?  Ok to start one if she needs one and we dont have a copy available

## 2024-04-28 ENCOUNTER — Other Ambulatory Visit: Payer: Self-pay

## 2024-04-28 ENCOUNTER — Ambulatory Visit (INDEPENDENT_AMBULATORY_CARE_PROVIDER_SITE_OTHER): Payer: MEDICAID

## 2024-04-28 ENCOUNTER — Ambulatory Visit: Payer: MEDICAID | Admitting: Family Medicine

## 2024-04-28 DIAGNOSIS — Z7901 Long term (current) use of anticoagulants: Secondary | ICD-10-CM | POA: Diagnosis not present

## 2024-04-28 DIAGNOSIS — Z538 Procedure and treatment not carried out for other reasons: Secondary | ICD-10-CM

## 2024-04-28 LAB — POCT INR
INR: 2.7 (ref 2.0–3.0)
PT: 32.6

## 2024-04-28 NOTE — Patient Instructions (Signed)
 Description   6 mg daily. f/u in 4 weeks.

## 2024-04-28 NOTE — Telephone Encounter (Signed)
 Pt reports she is needing a copy of form that was completed last year. Upon looking further FL2 form was scanned into media Advised copy will be available at front desk

## 2024-04-29 ENCOUNTER — Other Ambulatory Visit: Payer: Self-pay

## 2024-04-29 ENCOUNTER — Telehealth: Payer: Self-pay | Admitting: Family Medicine

## 2024-04-29 MED ORDER — WARFARIN SODIUM 1 MG PO TABS: ORAL_TABLET | ORAL | 1 refills | Status: DC

## 2024-04-29 NOTE — Progress Notes (Signed)
 Pt seen for INR clinic in office. Was not see by this provider.

## 2024-04-29 NOTE — Telephone Encounter (Signed)
 [facility] faxed refill request for the following medications: warfarin (COUMADIN ) 1 MG tablet  ////Sig: Take one  along with 5 mg pill daily except for Mondays take 2 pills with 5 mg pill.  Equal to 6 mg daily except 7 mg on Monday.   Please advise

## 2024-05-03 NOTE — Telephone Encounter (Signed)
 Noted

## 2024-05-04 NOTE — Telephone Encounter (Signed)
 Copied from CRM [id number]. Topic: Medical Record Request - Other >> May 04, 2024  8:30 AM Powell HERO wrote: Reason for CRM: Patient is requesting a copy of her FL2 form from April-June of 2025. Fax [phone]. ATTN, [place].

## 2024-05-04 NOTE — Telephone Encounter (Signed)
 Printed out copy and faxed to requested number 05/04/24 @ 9:51am

## 2024-05-05 ENCOUNTER — Other Ambulatory Visit: Payer: Self-pay | Admitting: *Deleted

## 2024-05-05 ENCOUNTER — Telehealth: Payer: Self-pay

## 2024-05-05 DIAGNOSIS — I87002 Postthrombotic syndrome without complications of left lower extremity: Secondary | ICD-10-CM

## 2024-05-05 DIAGNOSIS — Z86718 Personal history of other venous thrombosis and embolism: Secondary | ICD-10-CM

## 2024-05-05 DIAGNOSIS — I871 Compression of vein: Secondary | ICD-10-CM

## 2024-05-05 NOTE — Telephone Encounter (Signed)
 Copied from CRM [id number]. Topic: General - Other >> May 05, 2024 10:17 AM Turkey B wrote: Reason for CRM: Delon , from [place], pt needs 2025 Fl2 form faxed to [phone], for [employer]

## 2024-05-05 NOTE — Telephone Encounter (Signed)
 Copied from CRM [id number]. Topic: General - Other >> May 05, 2024 10:17 AM Turkey B wrote: Reason for CRM: Delon , from [place], pt needs 2025 Fl2 form faxed to [phone], for [employer] >> May 05, 2024  4:08 PM Wyona P wrote: Delon BLAZING calling back in due to receiving fax from office but receieing ht eincorrect forms. Delon received forms from 2024 and is reqeusting the forms e refaxed and sent for the year 2025, FL2 forms for the year 2025.   Called [facility] [place] and was advised by clinical to send the [facility] 2024.  Delon would need FL2 for 2025 and is requesting it to be filled out and sent back. Delon said she physically dropped off it.   Pt callback [phone] >> May 05, 2024  1:52 PM Emylou G wrote: Darden Delon we faxed it.[email or web address]

## 2024-05-05 NOTE — Telephone Encounter (Signed)
 Faxed FL2 paperwork to [facility] social services per the request for it from [place] [employer] on 05/05/24 @ 1:34pm

## 2024-05-06 ENCOUNTER — Telehealth: Payer: Self-pay

## 2024-05-06 NOTE — Telephone Encounter (Signed)
 Copied from CRM [id number]. Topic: General - Other >> May 06, 2024 10:09 AM Marissa P wrote: Reason for CRM: [place] social worker called in regards to patient please follow up in regards to a form [phone] no ext.

## 2024-05-06 NOTE — Telephone Encounter (Signed)
 Please start new FL2 - copy info from previous one available from 2024 (update meds if those have changed)

## 2024-05-06 NOTE — Telephone Encounter (Signed)
 Delon called with [employer] again asking for the 2025 FL2 form I stated to her that the form was not on file for this current year but we would neded 7-10 days to work on it. She stated that patient had an appt back in June and one was provided to patient and another Child psychotherapist but ended up lost in a audit on their end.   FL2 has been faxed 05/06/2024 @ 4:45pm and sent to [place] [facility] [place] at [phone]

## 2024-05-06 NOTE — Telephone Encounter (Unsigned)
 Copied from CRM [id number]. Topic: General - Other >> May 06, 2024 11:27 AM Cleave MATSU wrote: Reason for CRM: needs fl2 from may or June of 2025 they keep sending her the wrong one from 2024. Each time she spoked with someone she used the term updated 2025 and they keep sending her the wrong one.

## 2024-05-06 NOTE — Telephone Encounter (Signed)
 Copied from CRM [id number]. Topic: General - Other >> May 06, 2024 10:09 AM Marissa P wrote: Reason for CRM: [place] social worker called in regards to patient please follow up in regards to a form [phone] no ext.

## 2024-05-08 NOTE — Assessment & Plan Note (Signed)
 Doing well on ozempic - needs a refill of ozempic  today.  No change in dose or medication.

## 2024-05-08 NOTE — Assessment & Plan Note (Signed)
 Anthropometric Measurements Height: 5' 8 (1.727 m) Weight: 233 lb (105.7 kg) BMI (Calculated): 35.44 Weight at Last Visit: 232 lb Weight Lost Since Last Visit: 0 Weight Gained Since Last Visit: 1 lb Starting Weight: 283 lb Total Weight Loss (lbs): 50 lb (22.7 kg) Peak Weight: 290 lb Body Composition  Body Fat %: 49.2 % Fat Mass (lbs): 115 lbs Muscle Mass (lbs): 112.6 lbs Total Body Water (lbs): 95 lbs Visceral Fat Rating : 14 Other Clinical Data Fasting: no Labs: no Today's Visit #: 49 Starting Date: 03/29/20

## 2024-05-10 NOTE — Telephone Encounter (Signed)
 Yes my previous message on 05/06/24 it was sent to [facility] at [facility]. Does it need to be resent?

## 2024-05-18 ENCOUNTER — Encounter (HOSPITAL_COMMUNITY): Payer: Self-pay | Admitting: Psychiatry

## 2024-05-18 ENCOUNTER — Telehealth (INDEPENDENT_AMBULATORY_CARE_PROVIDER_SITE_OTHER): Payer: MEDICAID | Admitting: Psychiatry

## 2024-05-18 DIAGNOSIS — F3130 Bipolar disorder, current episode depressed, mild or moderate severity, unspecified: Secondary | ICD-10-CM

## 2024-05-18 DIAGNOSIS — F900 Attention-deficit hyperactivity disorder, predominantly inattentive type: Secondary | ICD-10-CM

## 2024-05-18 MED ORDER — AMPHETAMINE-DEXTROAMPHETAMINE 30 MG PO TABS: 30.0000 mg | ORAL_TABLET | Freq: Two times a day (BID) | ORAL | 0 refills | Status: DC

## 2024-05-18 MED ORDER — TRAZODONE HCL 150 MG PO TABS: 150.0000 mg | ORAL_TABLET | Freq: Every day | ORAL | 2 refills | Status: DC

## 2024-05-18 MED ORDER — FLUOXETINE HCL 40 MG PO CAPS: 40.0000 mg | ORAL_CAPSULE | Freq: Two times a day (BID) | ORAL | 2 refills | Status: DC

## 2024-05-18 MED ORDER — AMPHETAMINE-DEXTROAMPHETAMINE 30 MG PO TABS: 1.0000 | ORAL_TABLET | Freq: Two times a day (BID) | ORAL | 0 refills | Status: DC

## 2024-05-18 MED ORDER — ALPRAZOLAM 1 MG PO TABS: 1.0000 mg | ORAL_TABLET | Freq: Two times a day (BID) | ORAL | 2 refills | Status: DC | PRN

## 2024-05-18 NOTE — Progress Notes (Signed)
 Virtual Visit via Video Note  I connected with Jane [name] on 05/18/24 at  8:40 AM EDT by a video enabled telemedicine application and verified that I am speaking with the correct person using two identifiers.  Location: Patient: home Provider: office   I discussed the limitations of evaluation and management by telemedicine and the availability of in person appointments. The patient expressed understanding and agreed to proceed.     I discussed the assessment and treatment plan with the patient. The patient was provided an opportunity to ask questions and all were answered. The patient agreed with the plan and demonstrated an understanding of the instructions.   The patient was advised to call back or seek an in-person evaluation if the symptoms worsen or if the condition fails to improve as anticipated.  I provided 20 minutes of non-face-to-face time during this encounter.   Barnie Gull, MD  [facility] MD/PA/NP OP Progress Note  05/18/2024 8:52 AM Jane [name]  MRN:  [id number]  Chief Complaint:  Chief Complaint  Patient presents with   Depression   Anxiety   ADD   HPI: This patient is a 58 year old divorced white female who lives alone in [place]. She is on disability due to bipolar disorder   The patient returns for follow-up after 3 months regarding her depression mood swings and ADD.  For the most part she has been doing okay.  She had to have new femoral stents put in last month so far she is tolerating this well.  She is working a lot in her garden.  She still talks to her friend.  She states an old boyfriend has been bothering her and wanting to get back together and she is really not interested.  She still does not have a vehicle and gets somewhat isolated at her place but occasionally friends will pick her up.  She still has no contact with her kids or grandkids.  The patient states that she is not significantly depressed or anxious.  She denies thoughts of self-harm or  suicide.  She is sleeping well and her focus is good with the Adderall Visit Diagnosis:    ICD-10-CM   1. Attention deficit hyperactivity disorder (ADHD), predominantly inattentive type  F90.0     2. Bipolar I disorder, most recent episode depressed (HCC)  F31.30       Past Psychiatric History: Long-term outpatient treatment  Past Medical History:  Past Medical History:  Diagnosis Date   ADD (attention deficit disorder)    Allergy    Anxiety    Arthritis 2021   Back pain    Bilateral swelling of feet    Bipolar 1 disorder (HCC)    Bipolar 1 disorder (HCC)    Bipolar disorder (HCC)    Blood transfusion without reported diagnosis 2020   Chewing difficulty    Chronic fatigue syndrome    Chronic kidney disease    Stage 3 kidney disease;dx by Dr. Dallas Gelineau.    Clotting disorder (HCC)    I'm on a blood  thinner   Constipation    Depression 2000   Diabetes mellitus without complication (HCC)    diet controlled   Dyspnea    with exertion   GERD (gastroesophageal reflux disease)    Headache    migraines   High cholesterol    History of blood clots    History of DVT (deep vein thrombosis)    left leg   Hypertension    states under control with meds., has  been on med. x 2 years   Hypothyroidism    Joint pain    Neuromuscular disorder (HCC)    Neuropathy    Obsessive-compulsive disorder    Peripheral vascular disease (HCC)    Prediabetes    Respiratory failure requiring intubation (HCC)    Restless leg syndrome    Shortness of breath    Sleep apnea    Thrombocytopenia (HCC) 09/15/2019   Trigger thumb of left hand 01/2018   Trigger thumb of right hand     Past Surgical History:  Procedure Laterality Date   ABDOMINAL HYSTERECTOMY  06/2016   complete   APPLICATION OF WOUND VAC Left 04/27/2019   Procedure: APPLICATION OF WOUND VAC LEFT GROIN;  Surgeon: Sheree Penne Bruckner, MD;  Location: [facility] OR;  Service: Vascular;  Laterality: Left;   AV FISTULA PLACEMENT  Left 11/24/2018   Procedure: ARTERIOVENOUS (AV) FISTULA CREATION LEFT SFA TO LEFT FEMORAL VEIN;  Surgeon: Sheree Penne Bruckner, MD;  Location: [facility] OR;  Service: Vascular;  Laterality: Left;   BACK SURGERY     CHOLECYSTECTOMY     COLONOSCOPY WITH PROPOFOL  N/A 11/22/2019   Procedure: COLONOSCOPY WITH PROPOFOL ;  Surgeon: Therisa Bi, MD;  Location: [facility] ENDOSCOPY;  Service: Gastroenterology;  Laterality: N/A;   COLONOSCOPY WITH PROPOFOL  N/A 12/09/2022   Procedure: COLONOSCOPY WITH PROPOFOL ;  Surgeon: Therisa Bi, MD;  Location: [facility] ENDOSCOPY;  Service: Gastroenterology;  Laterality: N/A;   FEMORAL ARTERY EXPLORATION  03/30/2019   Procedure: Left Common Femoral Artery and Vein Exploration;  Surgeon: Sheree Penne Bruckner, MD;  Location: [facility] OR;  Service: Vascular;;   FEMORAL-FEMORAL BYPASS GRAFT Left 11/24/2018   Procedure: BYPASS GRAFT FEMORAL-FEMORAL VENOUS LEFT TO RIGHT PALMA PROCEDURE USING CRYOVEIN;  Surgeon: Sheree Penne Bruckner, MD;  Location: [facility] OR;  Service: Vascular;  Laterality: Left;   GROIN DEBRIDEMENT Left 04/27/2019   Procedure: QUILLIAN DEBRIDEMENT;  Surgeon: Sheree Penne Bruckner, MD;  Location: [facility] OR;  Service: Vascular;  Laterality: Left;   INSERTION OF ILIAC STENT  03/30/2019   Procedure: Stent of left common, external iliac veins and left common femoral vein;  Surgeon: Sheree Penne Bruckner, MD;  Location: [facility] OR;  Service: Vascular;;   KNEE ARTHROSCOPY WITH MENISCAL REPAIR Left 11/14/2020   Procedure: LEFT KNEE ARTHROSCOPY WITH PARTIAL MEDIAL MENISCECTOMY;  Surgeon: Margrette Taft BRAVO, MD;  Location: [facility] ORS;  Service: Orthopedics;  Laterality: Left;   LOWER EXTREMITY INTERVENTION Left 04/05/2024   Procedure: LOWER EXTREMITY INTERVENTION;  Surgeon: Sheree Penne Bruckner, MD;  Location: [facility] INVASIVE CV LAB;  Service: Cardiovascular;  Laterality: Left;   LOWER EXTREMITY VENOGRAPHY N/A 08/17/2018   Procedure: LOWER EXTREMITY VENOGRAPHY - Central Venogram;   Surgeon: Sheree Penne Bruckner, MD;  Location: [facility] INVASIVE CV LAB;  Service: Cardiovascular;  Laterality: N/A;   LOWER EXTREMITY VENOGRAPHY Bilateral 03/09/2019   Procedure: LOWER EXTREMITY VENOGRAPHY;  Surgeon: Sheree Penne Bruckner, MD;  Location: [facility] INVASIVE CV LAB;  Service: Cardiovascular;  Laterality: Bilateral;   LOWER EXTREMITY VENOGRAPHY Left 08/16/2019   Procedure: LOWER EXTREMITY VENOGRAPHY;  Surgeon: Sheree Penne Bruckner, MD;  Location: [facility] INVASIVE CV LAB;  Service: Cardiovascular;  Laterality: Left;   LOWER EXTREMITY VENOGRAPHY Left 03/25/2022   Procedure: LOWER EXTREMITY VENOGRAPHY;  Surgeon: Sheree Penne Bruckner, MD;  Location: [facility] INVASIVE CV LAB;  Service: Cardiovascular;  Laterality: Left;  IVUS   LOWER EXTREMITY VENOGRAPHY Left 04/05/2024   Procedure: LOWER EXTREMITY VENOGRAPHY;  Surgeon: Sheree Penne Bruckner, MD;  Location: [facility] INVASIVE CV LAB;  Service: Cardiovascular;  Laterality: Left;  LUMBAR FUSION  11/21/2000   L5-S1   LUMBAR SPINE SURGERY     x 2 others   PATCH ANGIOPLASTY Left 03/30/2019   Procedure: Patch Angioplasty of the Left Common Femoral Vein using Venosure Biologic patch;  Surgeon: Sheree Penne Bruckner, MD;  Location: [facility] OR;  Service: Vascular;  Laterality: Left;   PERIPHERAL VASCULAR INTERVENTION Left 08/16/2019   Procedure: PERIPHERAL VASCULAR INTERVENTION;  Surgeon: Sheree Penne Bruckner, MD;  Location: [facility] INVASIVE CV LAB;  Service: Cardiovascular;  Laterality: Left;  common femoral/femoral vein stent   PERIPHERAL VASCULAR INTERVENTION Left 03/25/2022   Procedure: PERIPHERAL VASCULAR INTERVENTION;  Surgeon: Sheree Penne Bruckner, MD;  Location: [facility] INVASIVE CV LAB;  Service: Cardiovascular;  Laterality: Left;  COMMON FEMORAL VEIN   PERIPHERAL VASCULAR THROMBECTOMY Left 03/25/2022   Procedure: PERIPHERAL VASCULAR THROMBECTOMY;  Surgeon: Sheree Penne Bruckner, MD;  Location: [facility] INVASIVE CV LAB;  Service: Cardiovascular;   Laterality: Left;  EXTREMITY/IVC   PERIPHERAL VASCULAR THROMBECTOMY Left 04/05/2024   Procedure: PERIPHERAL VASCULAR THROMBECTOMY;  Surgeon: Sheree Penne Bruckner, MD;  Location: [facility] INVASIVE CV LAB;  Service: Cardiovascular;  Laterality: Left;   PERIPHERAL VASCULAR ULTRASOUND/IVUS Left 04/05/2024   Procedure: Peripheral Vascular Ultrasound/IVUS;  Surgeon: Sheree Penne Bruckner, MD;  Location: [facility] INVASIVE CV LAB;  Service: Cardiovascular;  Laterality: Left;   SPINE SURGERY     TRIGGER FINGER RELEASE Right 12/01/2017   Procedure: RELEASE TRIGGER FINGER/A-1 PULLEY RIGHT THUMB;  Surgeon: Murrell Drivers, MD;  Location: [facility];  Service: Orthopedics;  Laterality: Right;   TRIGGER FINGER RELEASE Left 01/26/2018   Procedure: LEFT TRIGGER THUMB RELEASE;  Surgeon: Murrell Drivers, MD;  Location: [facility];  Service: Orthopedics;  Laterality: Left;   ULTRASOUND GUIDANCE FOR VASCULAR ACCESS Right 03/30/2019   Procedure: Ultrasound-guided cannulation right internal jugular vein;  Surgeon: Sheree Penne Bruckner, MD;  Location: [facility] OR;  Service: Vascular;  Laterality: Right;    Family Psychiatric History: See below  Family History:  Family History  Problem Relation Age of Onset   Heart disease Mother    Hyperlipidemia Mother    Hypertension Mother    Bipolar disorder Mother    Stroke Mother    Depression Mother    Sleep apnea Mother    Obesity Mother    Diabetes Father    Heart disease Father    Hyperlipidemia Father    Hypertension Father    Sleep apnea Father    Obesity Father    Drug abuse Daughter    ADD / ADHD Daughter    Drug abuse Daughter    Anxiety disorder Daughter    Bipolar disorder Daughter    Hypertension Sister    Hypertension Brother    Hyperlipidemia Brother    Heart disease Brother    Early death Brother    Kidney disease Brother    Bipolar disorder Maternal Aunt    Suicidality Maternal Aunt     Social History:  Social History    Socioeconomic History   Marital status: Divorced    Spouse name: Not on file   Number of children: 3   Years of education: Not on file   Highest education level: GED or equivalent  Occupational History   Not on file  Tobacco Use   Smoking status: Never   Smokeless tobacco: Never  Vaping Use   Vaping status: Never Used  Substance and Sexual Activity   Alcohol use: Never   Drug use: Never   Sexual activity: Not Currently  Partners: Male    Birth control/protection: Surgical  Other Topics Concern   Not on file  Social History Narrative   Not on file   Social Drivers of Health   Financial Resource Strain: Medium Risk (04/25/2024)   Overall Financial Resource Strain (CARDIA)    Difficulty of Paying Living Expenses: Somewhat hard  Food Insecurity: Food Insecurity Present (04/25/2024)   Hunger Vital Sign    Worried About Running Out of Food in the Last Year: Often true    Ran Out of Food in the Last Year: Often true  Transportation Needs: Unmet Transportation Needs (04/25/2024)   PRAPARE - Transportation    Lack of Transportation (Medical): No    Lack of Transportation (Non-Medical): Yes  Physical Activity: Sufficiently Active (04/25/2024)   Exercise Vital Sign    Days of Exercise per Week: 7 days    Minutes of Exercise per Session: 120 min  Stress: Stress Concern Present (04/25/2024)   [employer] of Occupational Health - Occupational Stress Questionnaire    Feeling of Stress: To some extent  Social Connections: Socially Isolated (04/25/2024)   Social Connection and Isolation Panel    Frequency of Communication with Friends and Family: More than three times a week    Frequency of Social Gatherings with Friends and Family: Never    Attends Religious Services: Never    Database administrator or Organizations: No    Attends Engineer, structural: Not on file    Marital Status: Divorced    Allergies:  Allergies  Allergen Reactions   Aripiprazole Other (See  Comments)    BECOMES  VIOLENT    Seroquel  [Quetiapine  Fumarate] Other (See Comments)    BECOMES VIOLENT   Chlorpromazine  Other (See Comments)    SEVERE ANXIETY    Gabapentin  Other (See Comments)    NIGHTMARES    Prednisone  Hives    Metabolic Disorder Labs: Lab Results  Component Value Date   HGBA1C 5.6 12/02/2023   MPG 131.24 11/06/2021   MPG 134.11 03/26/2019   No results found for: PROLACTIN Lab Results  Component Value Date   CHOL 167 12/02/2023   TRIG 160 (H) 12/02/2023   HDL 43 12/02/2023   CHOLHDL 3.9 12/02/2023   VLDL 37 11/06/2021   LDLCALC 96 12/02/2023   LDLCALC 109 (H) 05/09/2023   Lab Results  Component Value Date   TSH 1.200 12/02/2023   TSH 1.440 11/07/2022    Therapeutic Level Labs: No results found for: LITHIUM Lab Results  Component Value Date   VALPROATE 61.2 03/23/2014   No results found for: CBMZ  Current Medications: Current Outpatient Medications  Medication Sig Dispense Refill   AJOVY 225 MG/1.5ML SOAJ Inject 1 mL into the skin every 30 (thirty) days.     ALPRAZolam  (XANAX ) 1 MG tablet Take 1 tablet (1 mg total) by mouth 2 (two) times daily as needed. for anxiety 60 tablet 2   amphetamine -dextroamphetamine  (ADDERALL) 30 MG tablet Take 1 tablet by mouth 2 (two) times daily. 60 tablet 0   amphetamine -dextroamphetamine  (ADDERALL) 30 MG tablet Take 1 tablet by mouth 2 (two) times daily. 60 tablet 0   amphetamine -dextroamphetamine  (ADDERALL) 30 MG tablet Take 1 tablet by mouth 2 (two) times daily. 60 tablet 0   aspirin  EC 81 MG tablet Take 81 mg by mouth daily.     atorvastatin  (LIPITOR) 40 MG tablet Take 1 tablet (40 mg total) by mouth daily. 90 tablet 3   cetirizine  (ZYRTEC ) 10 MG tablet Take 1 tablet (  10 mg total) by mouth daily. 90 tablet 3   cromolyn (OPTICROM) 4 % ophthalmic solution Place 1 drop into both eyes 2 (two) times daily.     cyclobenzaprine  (FLEXERIL ) 5 MG tablet Take 1 tablet (5 mg total) by mouth 3 (three) times  daily as needed for muscle spasms. 90 tablet 2   famotidine  (PEPCID ) 20 MG tablet Take 20 mg by mouth daily.     FLUoxetine  (PROZAC ) 40 MG capsule Take 1 capsule (40 mg total) by mouth 2 (two) times daily. 180 capsule 2   HYDROcodone -acetaminophen  (NORCO) 10-325 MG tablet Take 1 tablet by mouth every 6 (six) hours as needed. To last 30 days from fill date 120 tablet 0   [START ON 05/21/2024] HYDROcodone -acetaminophen  (NORCO) 10-325 MG tablet Take 1 tablet by mouth every 6 (six) hours as needed. To last 30 days from fill date 120 tablet 0   levothyroxine  (SYNTHROID ) 125 MCG tablet Take 1 tablet (125 mcg total) by mouth daily before breakfast. 90 tablet 3   LINZESS  290 MCG CAPS capsule TAKE ONE CAPSULE BY MOUTH DAILY 30 capsule 2   mirabegron  ER (MYRBETRIQ ) 50 MG TB24 tablet Take 1 tablet (50 mg total) by mouth daily. 30 tablet 11   Multiple Vitamin (MULTIVITAMIN) capsule Take 1 capsule by mouth daily.     nitrofurantoin  (MACRODANTIN ) 100 MG capsule Take 1 capsule (100 mg total) by mouth daily. 30 capsule 11   Omega-3 Fatty Acids (OMEGA 3 PO) Take 1 capsule by mouth daily.     pantoprazole  (PROTONIX ) 40 MG tablet TAKE 1 TABLET BY MOUTH DAILY 90 tablet 0   pimecrolimus  (ELIDEL ) 1 % cream Apply to affected areas rash in body folds and legs once to twice daily until improved. 30 g 3   polyethylene glycol powder (GLYCOLAX /MIRALAX ) 17 GM/SCOOP powder TAKE 17 Gram BY MOUTH DAILY 850 g 1   potassium chloride  (KLOR-CON  M) 10 MEQ tablet Take 1 tablet (10 mEq total) by mouth 3 (three) times daily. 270 tablet 3   pregabalin  (LYRICA ) 100 MG capsule TAKE ONE CAPSULE BY MOUTH FOUR TIMES DAILY 120 capsule 5   RESTASIS  0.05 % ophthalmic emulsion Place 1 drop into both eyes 2 (two) times daily.     rOPINIRole  (REQUIP ) 2 MG tablet TAKE 1 TABLET BY MOUTH TWICE DAILY 180 tablet 0   Semaglutide , 1 MG/DOSE, 4 MG/3ML SOPN Inject 1 mg as directed once a week. 9 mL 0   traZODone  (DESYREL ) 150 MG tablet Take 1 tablet (150 mg  total) by mouth at bedtime. 30 tablet 2   UBRELVY  100 MG TABS Take 100 mg by mouth daily as needed (migraines). May take a second 100 mg dose 2 hours later as needed for migraines     warfarin (COUMADIN ) 1 MG tablet Take one  along with 5 mg pill daily except for Mondays take 2 pills with 5 mg pill.  Equal to 6 mg daily except 7 mg on Monday. 90 tablet 1   warfarin (COUMADIN ) 5 MG tablet Take 7.5 mg daily except 5mg  on Saturday and Sunday. (Patient taking differently: Take 5 mg by mouth daily. Take with 1 mg to equal 6 mg daily) 45 tablet 11   No current facility-administered medications for this visit.     Musculoskeletal: Strength & Muscle Tone: within normal limits Gait & Station: normal Patient leans: N/A  Psychiatric Specialty Exam: Review of Systems  Musculoskeletal:  Positive for arthralgias and joint swelling.  All other systems reviewed and are negative.  There were no vitals taken for this visit.There is no height or weight on file to calculate BMI.  General Appearance: Casual and Fairly Groomed  Eye Contact:  Good  Speech:  Clear and Coherent  Volume:  Normal  Mood:  Euthymic  Affect:  Congruent  Thought Process:  Goal Directed  Orientation:  Full (Time, Place, and Person)  Thought Content: WDL   Suicidal Thoughts:  No  Homicidal Thoughts:  No  Memory:  Immediate;   Good  Judgement:  Good  Insight:  Good  Psychomotor Activity:  Normal  Concentration:  Concentration: Good and Attention Span: Good  Recall:  Good  Fund of Knowledge: Good  Language: Good  Akathisia:  No  Handed:  Right  AIMS (if indicated): not done  Assets:  Communication Skills Desire for Improvement Resilience Social Support  ADL's:  Intact  Cognition: WNL  Sleep:  Good   Screenings: GAD-7    Flowsheet Row Office Visit from 02/25/2024 in [facility] Office Visit from 10/06/2023 in [facility]  Total GAD-7 Score 0 5   PHQ2-9     Flowsheet Row Office Visit from 03/25/2024 in [facility] Office Visit from 03/16/2024 in [facility] Visit from 02/25/2024 in [facility] Office Visit from 12/09/2023 in [facility] Procedure visit from 10/13/2023 in [facility] Total Score 0 0 4 4 1   PHQ-9 Total Score 3 -- 20 -- --   Flowsheet Row Admission (Discharged) from 04/05/2024 in [facility] 4E CV SURGICAL PROGRESSIVE CARE ED from 01/30/2023 in [facility] Emergency Department at [place] Video Visit from 01/02/2023 in [facility] at [place]  C-SSRS RISK CATEGORY No Risk No Risk No Risk     Assessment and Plan: This patient is a 58 year old female with a history depression anxiety and ADD.  She continues to do well on her current regimen.  She will continue Prozac  80 mg daily for depression, Xanax  1 mg twice daily for anxiety as needed, Adderall 20 mg twice daily for ADD and trazodone  150 mg at bedtime for sleep.  She will return to see me in 3 months  Collaboration of Care: Collaboration of Care: Primary Care Provider AEB notes will be shared with PCP at patient's request  Patient/Guardian was advised Release of Information must be obtained prior to any record release in order to collaborate their care with an outside provider. Patient/Guardian was advised if they have not already done so to contact the registration department to sign all necessary forms in order for us  to release information regarding their care.   Consent: Patient/Guardian gives verbal consent for treatment and assignment of benefits for services provided during this visit. Patient/Guardian expressed understanding and agreed to proceed.    Barnie Gull, MD 05/18/2024, 8:52 AM

## 2024-05-19 ENCOUNTER — Ambulatory Visit (INDEPENDENT_AMBULATORY_CARE_PROVIDER_SITE_OTHER): Payer: MEDICAID | Admitting: Vascular Surgery

## 2024-05-19 ENCOUNTER — Ambulatory Visit (HOSPITAL_COMMUNITY)
Admission: RE | Admit: 2024-05-19 | Discharge: 2024-05-19 | Disposition: A | Discharge status: Home / Self Care | Payer: MEDICAID | Source: Ambulatory Visit | Attending: Vascular Surgery | Admitting: Vascular Surgery

## 2024-05-19 ENCOUNTER — Ambulatory Visit (HOSPITAL_BASED_OUTPATIENT_CLINIC_OR_DEPARTMENT_OTHER)
Admission: RE | Admit: 2024-05-19 | Discharge: 2024-05-19 | Disposition: A | Discharge status: Home / Self Care | Payer: MEDICAID | Source: Ambulatory Visit | Attending: Vascular Surgery | Admitting: Vascular Surgery

## 2024-05-19 ENCOUNTER — Encounter: Payer: Self-pay | Admitting: Vascular Surgery

## 2024-05-19 ENCOUNTER — Telehealth (HOSPITAL_COMMUNITY): Payer: MEDICAID | Admitting: Psychiatry

## 2024-05-19 VITALS — BP 143/80 | HR 62 | Temp 98.3°F | Ht 68.0 in | Wt 238.0 lb

## 2024-05-19 DIAGNOSIS — I87002 Postthrombotic syndrome without complications of left lower extremity: Secondary | ICD-10-CM | POA: Diagnosis not present

## 2024-05-19 DIAGNOSIS — I871 Compression of vein: Secondary | ICD-10-CM

## 2024-05-19 DIAGNOSIS — Z86718 Personal history of other venous thrombosis and embolism: Secondary | ICD-10-CM | POA: Insufficient documentation

## 2024-05-19 NOTE — Progress Notes (Signed)
 Patient ID: Jane [name], female   DOB: 01/19/1966, 58 y.o.   MRN: [id number]  Reason for Consult: Follow-up   Referred by Myrla Jon HERO, MD  Subjective:     HPI:  Jane [name] is a 58 y.o. 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  She has now undergone restenting of her left external iliac and common femoral veins after percutaneous mechanical thrombectomy of her left common and external iliac and common femoral vein stents and balloon venoplasty of her left femoral vein.  She remains on Coumadin .  She remains noncompliant with compression stockings.  She states that her leg really feels normal all the way throughout other than pain in her left knee.  She is scheduled to be evaluated by orthopedics for possible left knee replacement.  She has also planning vacation to [facility] in the next month.  Past Medical History:  Diagnosis Date   ADD (attention deficit disorder)    Allergy    Anxiety    Arthritis 2021   Back pain    Bilateral swelling of feet    Bipolar 1 disorder (HCC)    Bipolar 1 disorder (HCC)    Bipolar disorder (HCC)    Blood transfusion without reported diagnosis 2020   Chewing difficulty    Chronic fatigue syndrome    Chronic kidney disease    Stage 3 kidney disease;dx by Dr. Dallas Gelineau.    Clotting disorder (HCC)    I'm on a blood  thinner   Constipation    Depression 2000   Diabetes mellitus without complication (HCC)    diet controlled   Dyspnea    with exertion   GERD (gastroesophageal reflux disease)    Headache    migraines   High cholesterol    History of blood clots    History of DVT (deep vein thrombosis)    left leg   Hypertension    states under control with meds., has been on med. x 2 years   Hypothyroidism    Joint pain    Neuromuscular disorder (HCC)    Neuropathy    Obsessive-compulsive disorder    Peripheral vascular disease  (HCC)    Prediabetes    Respiratory failure requiring intubation (HCC)    Restless leg syndrome    Shortness of breath    Sleep apnea    Thrombocytopenia (HCC) 09/15/2019   Trigger thumb of left hand 01/2018   Trigger thumb of right hand    Family History  Problem Relation Age of Onset   Heart disease Mother    Hyperlipidemia Mother    Hypertension Mother    Bipolar disorder Mother    Stroke Mother    Depression Mother    Sleep apnea Mother    Obesity Mother    Diabetes Father    Heart disease Father    Hyperlipidemia Father    Hypertension Father    Sleep apnea Father    Obesity Father    Drug abuse Daughter    ADD / ADHD Daughter    Drug abuse Daughter    Anxiety disorder Daughter    Bipolar disorder Daughter    Hypertension Sister    Hypertension Brother    Hyperlipidemia Brother    Heart disease Brother    Early death Brother    Kidney disease Brother    Bipolar disorder Maternal Aunt    Suicidality Maternal Aunt    Past Surgical  History:  Procedure Laterality Date   ABDOMINAL HYSTERECTOMY  06/2016   complete   APPLICATION OF WOUND VAC Left 04/27/2019   Procedure: APPLICATION OF WOUND VAC LEFT GROIN;  Surgeon: Sheree Penne Bruckner, MD;  Location: [facility] OR;  Service: Vascular;  Laterality: Left;   AV FISTULA PLACEMENT Left 11/24/2018   Procedure: ARTERIOVENOUS (AV) FISTULA CREATION LEFT SFA TO LEFT FEMORAL VEIN;  Surgeon: Sheree Penne Bruckner, MD;  Location: [facility] OR;  Service: Vascular;  Laterality: Left;   BACK SURGERY     CHOLECYSTECTOMY     COLONOSCOPY WITH PROPOFOL  N/A 11/22/2019   Procedure: COLONOSCOPY WITH PROPOFOL ;  Surgeon: Therisa Bi, MD;  Location: [facility] ENDOSCOPY;  Service: Gastroenterology;  Laterality: N/A;   COLONOSCOPY WITH PROPOFOL  N/A 12/09/2022   Procedure: COLONOSCOPY WITH PROPOFOL ;  Surgeon: Therisa Bi, MD;  Location: [facility] ENDOSCOPY;  Service: Gastroenterology;  Laterality: N/A;   FEMORAL ARTERY EXPLORATION  03/30/2019   Procedure:  Left Common Femoral Artery and Vein Exploration;  Surgeon: Sheree Penne Bruckner, MD;  Location: [facility] OR;  Service: Vascular;;   FEMORAL-FEMORAL BYPASS GRAFT Left 11/24/2018   Procedure: BYPASS GRAFT FEMORAL-FEMORAL VENOUS LEFT TO RIGHT PALMA PROCEDURE USING CRYOVEIN;  Surgeon: Sheree Penne Bruckner, MD;  Location: [facility] OR;  Service: Vascular;  Laterality: Left;   GROIN DEBRIDEMENT Left 04/27/2019   Procedure: QUILLIAN DEBRIDEMENT;  Surgeon: Sheree Penne Bruckner, MD;  Location: [facility] OR;  Service: Vascular;  Laterality: Left;   INSERTION OF ILIAC STENT  03/30/2019   Procedure: Stent of left common, external iliac veins and left common femoral vein;  Surgeon: Sheree Penne Bruckner, MD;  Location: [facility] OR;  Service: Vascular;;   KNEE ARTHROSCOPY WITH MENISCAL REPAIR Left 11/14/2020   Procedure: LEFT KNEE ARTHROSCOPY WITH PARTIAL MEDIAL MENISCECTOMY;  Surgeon: Margrette Taft BRAVO, MD;  Location: [facility] ORS;  Service: Orthopedics;  Laterality: Left;   LOWER EXTREMITY INTERVENTION Left 04/05/2024   Procedure: LOWER EXTREMITY INTERVENTION;  Surgeon: Sheree Penne Bruckner, MD;  Location: [facility] INVASIVE CV LAB;  Service: Cardiovascular;  Laterality: Left;   LOWER EXTREMITY VENOGRAPHY N/A 08/17/2018   Procedure: LOWER EXTREMITY VENOGRAPHY - Central Venogram;  Surgeon: Sheree Penne Bruckner, MD;  Location: [facility] INVASIVE CV LAB;  Service: Cardiovascular;  Laterality: N/A;   LOWER EXTREMITY VENOGRAPHY Bilateral 03/09/2019   Procedure: LOWER EXTREMITY VENOGRAPHY;  Surgeon: Sheree Penne Bruckner, MD;  Location: [facility] INVASIVE CV LAB;  Service: Cardiovascular;  Laterality: Bilateral;   LOWER EXTREMITY VENOGRAPHY Left 08/16/2019   Procedure: LOWER EXTREMITY VENOGRAPHY;  Surgeon: Sheree Penne Bruckner, MD;  Location: [facility] INVASIVE CV LAB;  Service: Cardiovascular;  Laterality: Left;   LOWER EXTREMITY VENOGRAPHY Left 03/25/2022   Procedure: LOWER EXTREMITY VENOGRAPHY;  Surgeon: Sheree Penne Bruckner, MD;   Location: [facility] INVASIVE CV LAB;  Service: Cardiovascular;  Laterality: Left;  IVUS   LOWER EXTREMITY VENOGRAPHY Left 04/05/2024   Procedure: LOWER EXTREMITY VENOGRAPHY;  Surgeon: Sheree Penne Bruckner, MD;  Location: [facility] INVASIVE CV LAB;  Service: Cardiovascular;  Laterality: Left;   LUMBAR FUSION  11/21/2000   L5-S1   LUMBAR SPINE SURGERY     x 2 others   PATCH ANGIOPLASTY Left 03/30/2019   Procedure: Patch Angioplasty of the Left Common Femoral Vein using Venosure Biologic patch;  Surgeon: Sheree Penne Bruckner, MD;  Location: [facility] OR;  Service: Vascular;  Laterality: Left;   PERIPHERAL VASCULAR INTERVENTION Left 08/16/2019   Procedure: PERIPHERAL VASCULAR INTERVENTION;  Surgeon: Sheree Penne Bruckner, MD;  Location: [facility] INVASIVE CV LAB;  Service: Cardiovascular;  Laterality: Left;  common femoral/femoral  vein stent   PERIPHERAL VASCULAR INTERVENTION Left 03/25/2022   Procedure: PERIPHERAL VASCULAR INTERVENTION;  Surgeon: Sheree Penne Bruckner, MD;  Location: [facility] INVASIVE CV LAB;  Service: Cardiovascular;  Laterality: Left;  COMMON FEMORAL VEIN   PERIPHERAL VASCULAR THROMBECTOMY Left 03/25/2022   Procedure: PERIPHERAL VASCULAR THROMBECTOMY;  Surgeon: Sheree Penne Bruckner, MD;  Location: [facility] INVASIVE CV LAB;  Service: Cardiovascular;  Laterality: Left;  EXTREMITY/IVC   PERIPHERAL VASCULAR THROMBECTOMY Left 04/05/2024   Procedure: PERIPHERAL VASCULAR THROMBECTOMY;  Surgeon: Sheree Penne Bruckner, MD;  Location: [facility] INVASIVE CV LAB;  Service: Cardiovascular;  Laterality: Left;   PERIPHERAL VASCULAR ULTRASOUND/IVUS Left 04/05/2024   Procedure: Peripheral Vascular Ultrasound/IVUS;  Surgeon: Sheree Penne Bruckner, MD;  Location: [facility] INVASIVE CV LAB;  Service: Cardiovascular;  Laterality: Left;   SPINE SURGERY     TRIGGER FINGER RELEASE Right 12/01/2017   Procedure: RELEASE TRIGGER FINGER/A-1 PULLEY RIGHT THUMB;  Surgeon: Murrell Drivers, MD;  Location: [facility];  Service:  Orthopedics;  Laterality: Right;   TRIGGER FINGER RELEASE Left 01/26/2018   Procedure: LEFT TRIGGER THUMB RELEASE;  Surgeon: Murrell Drivers, MD;  Location: [facility];  Service: Orthopedics;  Laterality: Left;   ULTRASOUND GUIDANCE FOR VASCULAR ACCESS Right 03/30/2019   Procedure: Ultrasound-guided cannulation right internal jugular vein;  Surgeon: Sheree Penne Bruckner, MD;  Location: [facility] OR;  Service: Vascular;  Laterality: Right;    Short Social History:  Social History   Tobacco Use   Smoking status: Never   Smokeless tobacco: Never  Substance Use Topics   Alcohol use: Never    Allergies  Allergen Reactions   Aripiprazole Other (See Comments)    BECOMES  VIOLENT    Seroquel  [Quetiapine  Fumarate] Other (See Comments)    BECOMES VIOLENT   Chlorpromazine  Other (See Comments)    SEVERE ANXIETY    Gabapentin  Other (See Comments)    NIGHTMARES    Prednisone  Hives    Current Outpatient Medications  Medication Sig Dispense Refill   AJOVY 225 MG/1.5ML SOAJ Inject 1 mL into the skin every 30 (thirty) days.     ALPRAZolam  (XANAX ) 1 MG tablet Take 1 tablet (1 mg total) by mouth 2 (two) times daily as needed. for anxiety 60 tablet 2   amphetamine -dextroamphetamine  (ADDERALL) 30 MG tablet Take 1 tablet by mouth 2 (two) times daily. 60 tablet 0   amphetamine -dextroamphetamine  (ADDERALL) 30 MG tablet Take 1 tablet by mouth 2 (two) times daily. 60 tablet 0   amphetamine -dextroamphetamine  (ADDERALL) 30 MG tablet Take 1 tablet by mouth 2 (two) times daily. 60 tablet 0   aspirin  EC 81 MG tablet Take 81 mg by mouth daily.     atorvastatin  (LIPITOR) 40 MG tablet Take 1 tablet (40 mg total) by mouth daily. 90 tablet 3   cetirizine  (ZYRTEC ) 10 MG tablet Take 1 tablet (10 mg total) by mouth daily. 90 tablet 3   cromolyn (OPTICROM) 4 % ophthalmic solution Place 1 drop into both eyes 2 (two) times daily.     cyclobenzaprine  (FLEXERIL ) 5 MG tablet Take 1 tablet (5 mg total) by  mouth 3 (three) times daily as needed for muscle spasms. 90 tablet 2   famotidine  (PEPCID ) 20 MG tablet Take 20 mg by mouth daily.     FLUoxetine  (PROZAC ) 40 MG capsule Take 1 capsule (40 mg total) by mouth 2 (two) times daily. 180 capsule 2   HYDROcodone -acetaminophen  (NORCO) 10-325 MG tablet Take 1 tablet by mouth every 6 (six) hours as needed. To last 30  days from fill date 120 tablet 0   [START ON 05/21/2024] HYDROcodone -acetaminophen  (NORCO) 10-325 MG tablet Take 1 tablet by mouth every 6 (six) hours as needed. To last 30 days from fill date 120 tablet 0   levothyroxine  (SYNTHROID ) 125 MCG tablet Take 1 tablet (125 mcg total) by mouth daily before breakfast. 90 tablet 3   LINZESS  290 MCG CAPS capsule TAKE ONE CAPSULE BY MOUTH DAILY 30 capsule 2   mirabegron  ER (MYRBETRIQ ) 50 MG TB24 tablet Take 1 tablet (50 mg total) by mouth daily. 30 tablet 11   Multiple Vitamin (MULTIVITAMIN) capsule Take 1 capsule by mouth daily.     nitrofurantoin  (MACRODANTIN ) 100 MG capsule Take 1 capsule (100 mg total) by mouth daily. 30 capsule 11   Omega-3 Fatty Acids (OMEGA 3 PO) Take 1 capsule by mouth daily.     pantoprazole  (PROTONIX ) 40 MG tablet TAKE 1 TABLET BY MOUTH DAILY 90 tablet 0   pimecrolimus  (ELIDEL ) 1 % cream Apply to affected areas rash in body folds and legs once to twice daily until improved. 30 g 3   polyethylene glycol powder (GLYCOLAX /MIRALAX ) 17 GM/SCOOP powder TAKE 17 Gram BY MOUTH DAILY 850 g 1   potassium chloride  (KLOR-CON  M) 10 MEQ tablet Take 1 tablet (10 mEq total) by mouth 3 (three) times daily. 270 tablet 3   pregabalin  (LYRICA ) 100 MG capsule TAKE ONE CAPSULE BY MOUTH FOUR TIMES DAILY 120 capsule 5   RESTASIS  0.05 % ophthalmic emulsion Place 1 drop into both eyes 2 (two) times daily.     rOPINIRole  (REQUIP ) 2 MG tablet TAKE 1 TABLET BY MOUTH TWICE DAILY 180 tablet 0   Semaglutide , 1 MG/DOSE, 4 MG/3ML SOPN Inject 1 mg as directed once a week. 9 mL 0   traZODone  (DESYREL ) 150 MG tablet  Take 1 tablet (150 mg total) by mouth at bedtime. 30 tablet 2   UBRELVY  100 MG TABS Take 100 mg by mouth daily as needed (migraines). May take a second 100 mg dose 2 hours later as needed for migraines     warfarin (COUMADIN ) 1 MG tablet Take one  along with 5 mg pill daily except for Mondays take 2 pills with 5 mg pill.  Equal to 6 mg daily except 7 mg on Monday. 90 tablet 1   warfarin (COUMADIN ) 5 MG tablet Take 7.5 mg daily except 5mg  on Saturday and Sunday. (Patient taking differently: Take 5 mg by mouth daily. Take with 1 mg to equal 6 mg daily) 45 tablet 11   No current facility-administered medications for this visit.    Review of Systems  Constitutional:  Constitutional negative. HENT: HENT negative.  Eyes: Eyes negative.  Respiratory: Respiratory negative.  GI: Gastrointestinal negative.  Musculoskeletal: Positive for joint pain.  Neurological: Neurological negative. Hematologic: Hematologic/lymphatic negative.  Psychiatric: Psychiatric negative.        Objective:  Objective   Vitals:   05/19/24 0853  BP: (!) 143/80  Pulse: 62  Temp: 98.3 F (36.8 C)  Weight: 238 lb (108 kg)  Height: 5' 8 (1.727 m)   Body mass index is 36.19 kg/m.  Physical Exam HENT:     Head: Normocephalic.     Nose: Nose normal.  Eyes:     Pupils: Pupils are equal, round, and reactive to light.  Cardiovascular:     Rate and Rhythm: Normal rate.     Pulses: Normal pulses.  Pulmonary:     Effort: Pulmonary effort is normal.  Abdominal:  General: Abdomen is flat.  Musculoskeletal:     Comments: Right lower extremity measures 38 cm, left lower extremity 39 cm (measured 10 cm from tibial tuberosities)  Skin:    General: Skin is warm.     Capillary Refill: Capillary refill takes less than 2 seconds.  Neurological:     General: No focal deficit present.     Mental Status: She is alert.  Psychiatric:        Mood and Affect: Mood normal.        Thought Content: Thought content normal.         Judgment: Judgment normal.     Data: LEFT     CompressibilityPhasicitySpontaneityPropertiesThrombus  Aging  +---------+---------------+---------+-----------+----------+--------------+   CFV     Full           No       No                                    +---------+---------------+---------+-----------+----------+--------------+   FV Prox  Full                    No                                    +---------+---------------+---------+-----------+----------+--------------+   FV Mid   Full                    No                                    +---------+---------------+---------+-----------+----------+--------------+   FV DistalFull                    No                                    +---------+---------------+---------+-----------+----------+--------------+   POP     Full                    No                                    +---------+---------------+---------+-----------+----------+--------------+   PTV     Full                                                          +---------+---------------+---------+-----------+----------+--------------+   PERO    Full                                                          +---------+---------------+---------+-----------+----------+--------------+   GSV     Full                    Yes                                   +---------+---------------+---------+-----------+----------+--------------+  Summary:  RIGHT:  - No evidence of common femoral vein obstruction.    LEFT: - Findings consistent with chronic deep vein thrombosis involving  the left posterior tibial veins, and left peroneal veins.  - There is no evidence of superficial venous thrombosis.         Assessment/Plan:     58 year old female with extensive left lower extremity venous history.  Her left lower extremity is smaller than I have ever measured on today's evaluation.   I have recommended continued Coumadin  and compression stockings as tolerated.  She is okay for knee replacement as indicated.  I will have her follow-up in 6 months with IVC iliac duplex unless she has issues before then.     Penne Lonni Colorado MD [facility]

## 2024-05-24 ENCOUNTER — Ambulatory Visit: Payer: MEDICAID | Admitting: Urology

## 2024-05-24 ENCOUNTER — Other Ambulatory Visit: Payer: Self-pay

## 2024-05-24 DIAGNOSIS — I87002 Postthrombotic syndrome without complications of left lower extremity: Secondary | ICD-10-CM

## 2024-05-26 ENCOUNTER — Other Ambulatory Visit: Payer: Self-pay | Admitting: Family Medicine

## 2024-05-26 ENCOUNTER — Ambulatory Visit (INDEPENDENT_AMBULATORY_CARE_PROVIDER_SITE_OTHER): Payer: MEDICAID

## 2024-05-26 DIAGNOSIS — Z7901 Long term (current) use of anticoagulants: Secondary | ICD-10-CM

## 2024-05-26 LAB — POCT INR
INR: 3.2 — AB (ref 2.0–3.0)
POC INR: 3.2

## 2024-05-26 NOTE — Telephone Encounter (Signed)
 LOV 04/28/24 NOV 06/01/24 LRF 02/27/24 30 x 2

## 2024-05-26 NOTE — Patient Instructions (Signed)
 Description   Hold for 2 days then resume 6 mg daily and return to clinic in 2 weeks.

## 2024-05-27 ENCOUNTER — Other Ambulatory Visit: Payer: Self-pay | Admitting: Family Medicine

## 2024-05-31 ENCOUNTER — Ambulatory Visit: Payer: MEDICAID | Admitting: Urology

## 2024-05-31 ENCOUNTER — Encounter: Payer: Self-pay | Admitting: Urology

## 2024-05-31 VITALS — BP 149/65 | HR 67 | Ht 68.0 in | Wt 237.0 lb

## 2024-05-31 DIAGNOSIS — N3281 Overactive bladder: Secondary | ICD-10-CM

## 2024-05-31 DIAGNOSIS — N3946 Mixed incontinence: Secondary | ICD-10-CM

## 2024-05-31 LAB — URINALYSIS, COMPLETE
Bilirubin, UA: NEGATIVE
Glucose, UA: NEGATIVE
Ketones, UA: NEGATIVE
Leukocytes,UA: NEGATIVE
Nitrite, UA: NEGATIVE
Protein,UA: NEGATIVE
Specific Gravity, UA: 1.015 (ref 1.005–1.030)
Urobilinogen, Ur: 0.2 mg/dL (ref 0.2–1.0)
pH, UA: 6 (ref 5.0–7.5)

## 2024-05-31 LAB — MICROSCOPIC EXAMINATION: Epithelial Cells (non renal): >10 /HPF — AB (ref 0–10)

## 2024-05-31 LAB — BLADDER SCAN AMB NON-IMAGING: Scan Result: 13

## 2024-05-31 MED ORDER — SOLIFENACIN SUCCINATE 5 MG PO TABS: 5.0000 mg | ORAL_TABLET | Freq: Every day | ORAL | 11 refills | Status: AC

## 2024-05-31 NOTE — Progress Notes (Signed)
 05/31/2024 8:31 AM   [name] [birth year] [id number]  Referring provider: Myrla Jon HERO, MD [address] [place],  [place] [address]  Chief Complaint  Patient presents with   Medical Management of Chronic Issues    HPI: Br: Chronic renal insufficiency overactive bladder and urinary tract infections.  Was on Gemtesa  and vaginal estrogen cream.  Cystoscopy November 2022.  Has had improvement on Myrbetriq  in the past.  Recently saw a nurse practitioner on Gemtesa .  Having ongoing incontinence.  In May 2024 patient had a UTI with a positive culture.  He is on CPAP.  She was doing well on the Gemtesa  but the effect may have worn off.  Has had a normal CT scan.  Was worked up for microscopic hematuria in the past.  Was started on trimethoprim  hoping it would down regulate her incontinence.  When she saw a nurse practitioner in June she had worsening incontinence and a urinary tract infection and the culture was positive   Today Patient thinks she may have a bladder infection now with burning.  She said when she gets infected she has frequency urge incontinence and worsening nocturia.  She says when she is not infected she can void every 2 hours and get up once a night and that she is continent.  She repeated that when she is not infected she does not think she leaks.   Never filled trimethoprim  due to lack of approval.  I do not think she could afford the Gemtesa  but she said it works very well when she was not infected.  Using estrogen twice a week.   Still has right sided flank discomfort that comes and goes been discussed before  Patient understands that we want to control UTIs first.  Reassess in 3 months and daily Macrodantin  100 mg 30 x 11.  Flank discomfort not due to kidneys.  Reassess incontinence at that time.    Today Frequency stable.  Last culture positive.  No infections.  She is continent.  Very pleased.  Macrodantin  90 x 3 sent to pharmacy and see in 1  year.  In 1 year she might want to try to stop it and we discussed this.  Proceed accordingly  Today Patient saw physician extender in May 2025 and was put on Myrbetriq .  She had side effects from oxybutynin  and it failed Gemtesa  due to cost.  No urine culture recently.  She was cleared in 2022 for having microscopic hematuria and it had cystoscopy and a CT scan with contrast in 2022 and also a CT stone protocol in 2024.  She also had a CT scan with contrast in 2023  Currently she is infection free on daily Macrodantin  and does not report UTIs.  Myrbetriq  failed.  She has urge incontinence.  She leaks with coughing sneezing.  She wears 2-3 pads during the day moderately wet.  She wears 5 pads a day for high-volume bedwetting.  She voids every hour cannot hold it for 2 hours.  She gets up 5-6 times a night and does have some ankle edema.  She does not take a diuretic            PMH: Past Medical History:  Diagnosis Date   ADD (attention deficit disorder)    Allergy    Anxiety    Arthritis 2021   Back pain    Bilateral swelling of feet    Bipolar 1 disorder (HCC)    Bipolar 1 disorder (HCC)  Bipolar disorder (HCC)    Blood transfusion without reported diagnosis 2020   Chewing difficulty    Chronic fatigue syndrome    Chronic kidney disease    Stage 3 kidney disease;dx by Dr. Dallas Gelineau.    Clotting disorder (HCC)    I'm on a blood  thinner   Constipation    Depression 2000   Diabetes mellitus without complication (HCC)    diet controlled   Dyspnea    with exertion   GERD (gastroesophageal reflux disease)    Headache    migraines   High cholesterol    History of blood clots    History of DVT (deep vein thrombosis)    left leg   Hypertension    states under control with meds., has been on med. x 2 years   Hypothyroidism    Joint pain    Neuromuscular disorder (HCC)    Neuropathy    Obsessive-compulsive disorder    Peripheral vascular disease (HCC)     Prediabetes    Respiratory failure requiring intubation (HCC)    Restless leg syndrome    Shortness of breath    Sleep apnea    Thrombocytopenia (HCC) 09/15/2019   Trigger thumb of left hand 01/2018   Trigger thumb of right hand     Surgical History: Past Surgical History:  Procedure Laterality Date   ABDOMINAL HYSTERECTOMY  06/2016   complete   APPLICATION OF WOUND VAC Left 04/27/2019   Procedure: APPLICATION OF WOUND VAC LEFT GROIN;  Surgeon: Sheree Penne Bruckner, MD;  Location: [facility] OR;  Service: Vascular;  Laterality: Left;   AV FISTULA PLACEMENT Left 11/24/2018   Procedure: ARTERIOVENOUS (AV) FISTULA CREATION LEFT SFA TO LEFT FEMORAL VEIN;  Surgeon: Sheree Penne Bruckner, MD;  Location: [facility] OR;  Service: Vascular;  Laterality: Left;   BACK SURGERY     CHOLECYSTECTOMY     COLONOSCOPY WITH PROPOFOL  N/A 11/22/2019   Procedure: COLONOSCOPY WITH PROPOFOL ;  Surgeon: Therisa Bi, MD;  Location: [facility] ENDOSCOPY;  Service: Gastroenterology;  Laterality: N/A;   COLONOSCOPY WITH PROPOFOL  N/A 12/09/2022   Procedure: COLONOSCOPY WITH PROPOFOL ;  Surgeon: Therisa Bi, MD;  Location: [facility] ENDOSCOPY;  Service: Gastroenterology;  Laterality: N/A;   FEMORAL ARTERY EXPLORATION  03/30/2019   Procedure: Left Common Femoral Artery and Vein Exploration;  Surgeon: Sheree Penne Bruckner, MD;  Location: [facility] OR;  Service: Vascular;;   FEMORAL-FEMORAL BYPASS GRAFT Left 11/24/2018   Procedure: BYPASS GRAFT FEMORAL-FEMORAL VENOUS LEFT TO RIGHT PALMA PROCEDURE USING CRYOVEIN;  Surgeon: Sheree Penne Bruckner, MD;  Location: [facility] OR;  Service: Vascular;  Laterality: Left;   GROIN DEBRIDEMENT Left 04/27/2019   Procedure: QUILLIAN DEBRIDEMENT;  Surgeon: Sheree Penne Bruckner, MD;  Location: [facility] OR;  Service: Vascular;  Laterality: Left;   INSERTION OF ILIAC STENT  03/30/2019   Procedure: Stent of left common, external iliac veins and left common femoral vein;  Surgeon: Sheree Penne Bruckner, MD;   Location: [facility] OR;  Service: Vascular;;   KNEE ARTHROSCOPY WITH MENISCAL REPAIR Left 11/14/2020   Procedure: LEFT KNEE ARTHROSCOPY WITH PARTIAL MEDIAL MENISCECTOMY;  Surgeon: Margrette Taft BRAVO, MD;  Location: [facility] ORS;  Service: Orthopedics;  Laterality: Left;   LOWER EXTREMITY INTERVENTION Left 04/05/2024   Procedure: LOWER EXTREMITY INTERVENTION;  Surgeon: Sheree Penne Bruckner, MD;  Location: [facility] INVASIVE CV LAB;  Service: Cardiovascular;  Laterality: Left;   LOWER EXTREMITY VENOGRAPHY N/A 08/17/2018   Procedure: LOWER EXTREMITY VENOGRAPHY - Central Venogram;  Surgeon: Sheree Penne Bruckner, MD;  Location: [facility]  INVASIVE CV LAB;  Service: Cardiovascular;  Laterality: N/A;   LOWER EXTREMITY VENOGRAPHY Bilateral 03/09/2019   Procedure: LOWER EXTREMITY VENOGRAPHY;  Surgeon: Sheree Penne Bruckner, MD;  Location: [facility] INVASIVE CV LAB;  Service: Cardiovascular;  Laterality: Bilateral;   LOWER EXTREMITY VENOGRAPHY Left 08/16/2019   Procedure: LOWER EXTREMITY VENOGRAPHY;  Surgeon: Sheree Penne Bruckner, MD;  Location: [facility] INVASIVE CV LAB;  Service: Cardiovascular;  Laterality: Left;   LOWER EXTREMITY VENOGRAPHY Left 03/25/2022   Procedure: LOWER EXTREMITY VENOGRAPHY;  Surgeon: Sheree Penne Bruckner, MD;  Location: [facility] INVASIVE CV LAB;  Service: Cardiovascular;  Laterality: Left;  IVUS   LOWER EXTREMITY VENOGRAPHY Left 04/05/2024   Procedure: LOWER EXTREMITY VENOGRAPHY;  Surgeon: Sheree Penne Bruckner, MD;  Location: [facility] INVASIVE CV LAB;  Service: Cardiovascular;  Laterality: Left;   LUMBAR FUSION  11/21/2000   L5-S1   LUMBAR SPINE SURGERY     x 2 others   PATCH ANGIOPLASTY Left 03/30/2019   Procedure: Patch Angioplasty of the Left Common Femoral Vein using Venosure Biologic patch;  Surgeon: Sheree Penne Bruckner, MD;  Location: [facility] OR;  Service: Vascular;  Laterality: Left;   PERIPHERAL VASCULAR INTERVENTION Left 08/16/2019   Procedure: PERIPHERAL VASCULAR INTERVENTION;  Surgeon: Sheree Penne Bruckner, MD;  Location: [facility] INVASIVE CV LAB;  Service: Cardiovascular;  Laterality: Left;  common femoral/femoral vein stent   PERIPHERAL VASCULAR INTERVENTION Left 03/25/2022   Procedure: PERIPHERAL VASCULAR INTERVENTION;  Surgeon: Sheree Penne Bruckner, MD;  Location: [facility] INVASIVE CV LAB;  Service: Cardiovascular;  Laterality: Left;  COMMON FEMORAL VEIN   PERIPHERAL VASCULAR THROMBECTOMY Left 03/25/2022   Procedure: PERIPHERAL VASCULAR THROMBECTOMY;  Surgeon: Sheree Penne Bruckner, MD;  Location: [facility] INVASIVE CV LAB;  Service: Cardiovascular;  Laterality: Left;  EXTREMITY/IVC   PERIPHERAL VASCULAR THROMBECTOMY Left 04/05/2024   Procedure: PERIPHERAL VASCULAR THROMBECTOMY;  Surgeon: Sheree Penne Bruckner, MD;  Location: [facility] INVASIVE CV LAB;  Service: Cardiovascular;  Laterality: Left;   PERIPHERAL VASCULAR ULTRASOUND/IVUS Left 04/05/2024   Procedure: Peripheral Vascular Ultrasound/IVUS;  Surgeon: Sheree Penne Bruckner, MD;  Location: [facility] INVASIVE CV LAB;  Service: Cardiovascular;  Laterality: Left;   SPINE SURGERY     TRIGGER FINGER RELEASE Right 12/01/2017   Procedure: RELEASE TRIGGER FINGER/A-1 PULLEY RIGHT THUMB;  Surgeon: Murrell Drivers, MD;  Location: [facility];  Service: Orthopedics;  Laterality: Right;   TRIGGER FINGER RELEASE Left 01/26/2018   Procedure: LEFT TRIGGER THUMB RELEASE;  Surgeon: Murrell Drivers, MD;  Location: [facility];  Service: Orthopedics;  Laterality: Left;   ULTRASOUND GUIDANCE FOR VASCULAR ACCESS Right 03/30/2019   Procedure: Ultrasound-guided cannulation right internal jugular vein;  Surgeon: Sheree Penne Bruckner, MD;  Location: [facility] OR;  Service: Vascular;  Laterality: Right;    Home Medications:  Allergies as of 05/31/2024       Reactions   Aripiprazole Other (See Comments)   BECOMES  VIOLENT   Seroquel  [quetiapine  Fumarate] Other (See Comments)   BECOMES VIOLENT   Chlorpromazine  Other (See Comments)   SEVERE  ANXIETY   Gabapentin  Other (See Comments)   NIGHTMARES   Prednisone  Hives        Medication List        Accurate as of May 31, 2024  8:31 AM. If you have any questions, ask your nurse or doctor.          Ajovy 225 MG/1.5ML Soaj Generic drug: Fremanezumab-vfrm Inject 1 mL into the skin every 30 (thirty) days.   ALPRAZolam  1 MG tablet Commonly known as: XANAX  Take  1 tablet (1 mg total) by mouth 2 (two) times daily as needed. for anxiety   amphetamine -dextroamphetamine  30 MG tablet Commonly known as: Adderall Take 1 tablet by mouth 2 (two) times daily.   amphetamine -dextroamphetamine  30 MG tablet Commonly known as: Adderall Take 1 tablet by mouth 2 (two) times daily.   amphetamine -dextroamphetamine  30 MG tablet Commonly known as: ADDERALL Take 1 tablet by mouth 2 (two) times daily.   aspirin  EC 81 MG tablet Take 81 mg by mouth daily.   atorvastatin  40 MG tablet Commonly known as: LIPITOR TAKE 1 TABLET BY MOUTH DAILY   cetirizine  10 MG tablet Commonly known as: ZYRTEC  TAKE 1 TABLET BY MOUTH DAILY   cromolyn 4 % ophthalmic solution Commonly known as: OPTICROM Place 1 drop into both eyes 2 (two) times daily.   cyclobenzaprine  5 MG tablet Commonly known as: FLEXERIL  Take 1 tablet (5 mg total) by mouth 3 (three) times daily as needed for muscle spasms.   famotidine  20 MG tablet Commonly known as: PEPCID  Take 20 mg by mouth daily.   FLUoxetine  40 MG capsule Commonly known as: PROZAC  Take 1 capsule (40 mg total) by mouth 2 (two) times daily.   HYDROcodone -acetaminophen  10-325 MG tablet Commonly known as: NORCO Take 1 tablet by mouth every 6 (six) hours as needed. To last 30 days from fill date   levothyroxine  125 MCG tablet Commonly known as: SYNTHROID  Take 1 tablet (125 mcg total) by mouth daily before breakfast.   Linzess  290 MCG Caps capsule Generic drug: linaclotide  TAKE ONE CAPSULE BY MOUTH DAILY   mirabegron  ER 50 MG Tb24 tablet Commonly  known as: MYRBETRIQ  Take 1 tablet (50 mg total) by mouth daily.   multivitamin capsule Take 1 capsule by mouth daily.   nitrofurantoin  100 MG capsule Commonly known as: Macrodantin  Take 1 capsule (100 mg total) by mouth daily.   OMEGA 3 PO Take 1 capsule by mouth daily.   Ozempic  (1 MG/DOSE) 4 MG/3ML Sopn Generic drug: Semaglutide  (1 MG/DOSE) Inject 1 mg as directed once a week.   pantoprazole  40 MG tablet Commonly known as: PROTONIX  TAKE 1 TABLET BY MOUTH DAILY   pimecrolimus  1 % cream Commonly known as: ELIDEL  Apply to affected areas rash in body folds and legs once to twice daily until improved.   polyethylene glycol powder 17 GM/SCOOP powder Commonly known as: GLYCOLAX /MIRALAX  TAKE 17 Gram BY MOUTH DAILY   potassium chloride  10 MEQ tablet Commonly known as: KLOR-CON  M Take 1 tablet (10 mEq total) by mouth 3 (three) times daily.   pregabalin  100 MG capsule Commonly known as: LYRICA  TAKE ONE CAPSULE BY MOUTH FOUR TIMES DAILY   Restasis  0.05 % ophthalmic emulsion Generic drug: cycloSPORINE  Place 1 drop into both eyes 2 (two) times daily.   rOPINIRole  2 MG tablet Commonly known as: REQUIP  TAKE 1 TABLET BY MOUTH TWICE DAILY   traZODone  150 MG tablet Commonly known as: DESYREL  Take 1 tablet (150 mg total) by mouth at bedtime.   Ubrelvy  100 MG Tabs Generic drug: Ubrogepant  Take 100 mg by mouth daily as needed (migraines). May take a second 100 mg dose 2 hours later as needed for migraines   warfarin 5 MG tablet Commonly known as: COUMADIN  Take as directed by the anticoagulation clinic. If you are unsure how to take this medication, talk to your nurse or doctor. Original instructions: Take 7.5 mg daily except 5mg  on Saturday and Sunday. What changed:  how much to take how to take this when to take  this additional instructions   warfarin 1 MG tablet Commonly known as: COUMADIN  Take as directed by the anticoagulation clinic. If you are unsure how to take  this medication, talk to your nurse or doctor. Original instructions: Take one  along with 5 mg pill daily except for Mondays take 2 pills with 5 mg pill.  Equal to 6 mg daily except 7 mg on Monday. What changed: Another medication with the same name was changed. Make sure you understand how and when to take each.        Allergies:  Allergies  Allergen Reactions   Aripiprazole Other (See Comments)    BECOMES  VIOLENT    Seroquel  [Quetiapine  Fumarate] Other (See Comments)    BECOMES VIOLENT   Chlorpromazine  Other (See Comments)    SEVERE ANXIETY    Gabapentin  Other (See Comments)    NIGHTMARES    Prednisone  Hives    Family History: Family History  Problem Relation Age of Onset   Heart disease Mother    Hyperlipidemia Mother    Hypertension Mother    Bipolar disorder Mother    Stroke Mother    Depression Mother    Sleep apnea Mother    Obesity Mother    Diabetes Father    Heart disease Father    Hyperlipidemia Father    Hypertension Father    Sleep apnea Father    Obesity Father    Drug abuse Daughter    ADD / ADHD Daughter    Drug abuse Daughter    Anxiety disorder Daughter    Bipolar disorder Daughter    Hypertension Sister    Hypertension Brother    Hyperlipidemia Brother    Heart disease Brother    Early death Brother    Kidney disease Brother    Bipolar disorder Maternal Aunt    Suicidality Maternal Aunt     Social History:  reports that she has never smoked. She has never used smokeless tobacco. She reports that she does not drink alcohol and does not use drugs.  ROS:                                        Physical Exam: BP (!) 149/65   Pulse 67   Ht 5' 8 (1.727 m)   Wt 107.5 kg   LMP  (LMP Unknown)   BMI 36.04 kg/m   Constitutional:  Alert and oriented, No acute distress. HEENT: [place] AT, moist mucus membranes.  Trachea midline, no masses.   Laboratory Data: Lab Results  Component Value Date   WBC 5.3 04/05/2024    HGB 11.8 (L) 04/05/2024   HCT 37.3 04/05/2024   MCV 95.2 04/05/2024   PLT 98 (L) 04/05/2024    Lab Results  Component Value Date   CREATININE 1.43 (H) 04/06/2024    No results found for: PSA  No results found for: TESTOSTERONE  Lab Results  Component Value Date   HGBA1C 5.6 12/02/2023    Urinalysis    Component Value Date/Time   COLORURINE YELLOW (A) 01/30/2023 1328   APPEARANCEUR Clear 03/08/2024 1015   LABSPEC 1.010 01/30/2023 1328   PHURINE 7.0 01/30/2023 1328   GLUCOSEU Negative 03/08/2024 1015   HGBUR SMALL (A) 01/30/2023 1328   BILIRUBINUR Negative 03/08/2024 1015   KETONESUR NEGATIVE 01/30/2023 1328   PROTEINUR Negative 03/08/2024 1015   PROTEINUR NEGATIVE 01/30/2023 1328   UROBILINOGEN 0.2 10/06/2023  1418   NITRITE Negative 03/08/2024 1015   NITRITE NEGATIVE 01/30/2023 1328   LEUKOCYTESUR Negative 03/08/2024 1015   LEUKOCYTESUR NEGATIVE 01/30/2023 1328    Pertinent Imaging: Urine reviewed and sent for culture  Assessment & Plan: Patient has mixed incontinence and high-volume urge incontinence and bedwetting.  She is already frequency.  She has been cleared for microscopic hematuria.  She will return with urodynamics.  In the meantime I offered Vesicare .  Even though she is a non-smoker when she comes back after her urodynamics I am going to repeat her pelvic examination and repeat cystoscopy.  I called in Vesicare  5 mg 30 x 11.  We will then discuss refractory therapies pending results.  She did say that her incontinence had worsened a lot in the last few months  1. OAB (overactive bladder) (Primary)  - Urinalysis, Complete - Bladder Scan (Post Void Residual) in office   No follow-ups on file.  Glendia DELENA Elizabeth, MD  [facility] [address], [address] [place], [place] [address] [phone]

## 2024-05-31 NOTE — Patient Instructions (Addendum)
 Cystoscopy Cystoscopy is a procedure that is used to help diagnose and sometimes treat conditions that affect the lower urinary tract. The lower urinary tract includes the bladder and the urethra. The urethra is the tube that drains urine from the bladder. Cystoscopy is done using a thin, tube-shaped instrument with a light and camera at the end (cystoscope). The cystoscope may be hard or flexible, depending on the goal of the procedure. The cystoscope is inserted through the urethra, into the bladder. Cystoscopy may be recommended if you have: Urinary tract infections that keep coming back. Blood in the urine (hematuria). An inability to control when you urinate (urinary incontinence) or an overactive bladder. Unusual cells found in a urine sample. A blockage in the urethra, such as a urinary stone. Painful urination. An abnormality in the bladder found during an intravenous pyelogram (IVP) or CT scan. What are the risks? Generally, this is a safe procedure. However, problems may occur, including: Infection. Bleeding.  What happens during the procedure?  You will be given one or more of the following: A medicine to numb the area (local anesthetic). The area around the opening of your urethra will be cleaned. The cystoscope will be passed through your urethra into your bladder. Germ-free (sterile) fluid will flow through the cystoscope to fill your bladder. The fluid will stretch your bladder so that your health care provider can clearly examine your bladder walls. Your doctor will look at the urethra and bladder. The cystoscope will be removed The procedure may vary among health care providers  What can I expect after the procedure? After the procedure, it is common to have: Some soreness or pain in your urethra. Urinary symptoms. These include: Mild pain or burning when you urinate. Pain should stop within a few minutes after you urinate. This may last for up to a few days after the  procedure. A small amount of blood in your urine for several days. Feeling like you need to urinate but producing only a small amount of urine. Follow these instructions at home: General instructions Return to your normal activities as told by your health care provider.  Drink plenty of fluids after the procedure. Keep all follow-up visits as told by your health care provider. This is important. Contact a health care provider if you: Have pain that gets worse or does not get better with medicine, especially pain when you urinate lasting longer than 72 hours after the procedure. Have trouble urinating. Get help right away if you: Have blood clots in your urine. Have a fever or chills. Are unable to urinate. Summary Cystoscopy is a procedure that is used to help diagnose and sometimes treat conditions that affect the lower urinary tract. Cystoscopy is done using a thin, tube-shaped instrument with a light and camera at the end. After the procedure, it is common to have some soreness or pain in your urethra. It is normal to have blood in your urine after the procedure.  If you were prescribed an antibiotic medicine, take it as told by your health care provider.  This information is not intended to replace advice given to you by your health care provider. Make sure you discuss any questions you have with your health care provider. Document Revised: 10/13/2018 Document Reviewed: 10/13/2018 Elsevier Patient Education  2020 [employer] PATIENT INSTRUCTIONS FOR URODYNAMICS  You have been scheduled to have a URODYNAMIC TEST.  Urodynamics is a safe and simple test of the bladder and lower urinary tract.  This test is  performed in the office while you are awake and is very helpful in finding the cause of most urologic symptoms.  ARE THERE ANY SPECIAL PREPARATIONS?  This is an easy study to prepare for.  Your doctor may give you a prescription for 1-3 days of antibiotics.  Start antibiotics as  your doctor has instructed you.  Note: not everyone needs antibiotics prior to this test.  Those who are given antibiotics beforehand are usually given one oral dose of an antibiotic at our office the day of their procedure.  We realize that many people are unable to hold their urine for extended periods or may have a catheter in place.  Therefore, we DO NOT as you to arrie with a full bladder.  Patients with a catheter in place or who catheterize themselves may follow their normal routines.  You may be asked to complete a voiding diary and a voiding questionnaire.  If you currently have a catheter or use a catheter to empty your bladder.  You may eat and drink normally.  If you have a colostomy or ileostomy, please bring an extra bag as we will place a new bag after the procedure.  WHAT HAPPENS DURING THE TEST?  We begin by asking, those who are able, to urinate into a special toilet.  This toilet measures the amount you urinate and how fast you urinate.  It is not essential that you perform this initial void, as it is not necessary in order for us  to achieve a complete study.  Next, we will place a small, soft catheter into your bladder and a small, soft catheter into your rectum.  These catheters allow us  to record bladder pressure and give us  valuable information about your bladder function.  After the catheters are in place, we slowly fill your bladder with a  fluid that shows up under x-ray.  Utilizing x-rays allows us  to see the contour of your bladder, as well as your bladder neck during the voiding.  It takes approximately one hour to complete this test.  If you think you have a urinary tract infection, if you have diarrhea, or if you are constipated, please call prior to your appointment.  These condions could require that the test be rescheduled.  THINGS TO REMEMBER: Start your antibiotics as ordered Complete your voiding diary and bring it with you to your appointment Arrive with  comfortably full bladder, if possible Wear comfortable clothes Arrive 10-15 minutes prior to your appointment time If you need to reschedule, please notify us  as soon as possible so we can off the appointment to someone else Bring an extra colostomy or ileostomy bag

## 2024-06-01 ENCOUNTER — Ambulatory Visit: Payer: MEDICAID | Admitting: Family Medicine

## 2024-06-01 ENCOUNTER — Encounter: Payer: Self-pay | Admitting: Family Medicine

## 2024-06-01 VITALS — BP 112/74 | HR 66 | Temp 98.5°F | Ht 68.0 in | Wt 236.4 lb

## 2024-06-01 DIAGNOSIS — Z23 Encounter for immunization: Secondary | ICD-10-CM | POA: Diagnosis not present

## 2024-06-01 DIAGNOSIS — R09A2 Foreign body sensation, throat: Secondary | ICD-10-CM

## 2024-06-01 DIAGNOSIS — I152 Hypertension secondary to endocrine disorders: Secondary | ICD-10-CM

## 2024-06-01 DIAGNOSIS — E1122 Type 2 diabetes mellitus with diabetic chronic kidney disease: Secondary | ICD-10-CM

## 2024-06-01 DIAGNOSIS — E038 Other specified hypothyroidism: Secondary | ICD-10-CM | POA: Diagnosis not present

## 2024-06-01 DIAGNOSIS — Z7985 Long-term (current) use of injectable non-insulin antidiabetic drugs: Secondary | ICD-10-CM

## 2024-06-01 DIAGNOSIS — E1169 Type 2 diabetes mellitus with other specified complication: Secondary | ICD-10-CM

## 2024-06-01 DIAGNOSIS — E1165 Type 2 diabetes mellitus with hyperglycemia: Secondary | ICD-10-CM

## 2024-06-01 DIAGNOSIS — E1159 Type 2 diabetes mellitus with other circulatory complications: Secondary | ICD-10-CM | POA: Diagnosis not present

## 2024-06-01 DIAGNOSIS — E049 Nontoxic goiter, unspecified: Secondary | ICD-10-CM

## 2024-06-01 DIAGNOSIS — N183 Chronic kidney disease, stage 3 unspecified: Secondary | ICD-10-CM

## 2024-06-01 DIAGNOSIS — E785 Hyperlipidemia, unspecified: Secondary | ICD-10-CM

## 2024-06-01 LAB — POCT GLYCOSYLATED HEMOGLOBIN (HGB A1C): Hemoglobin A1C: 5.7 % — AB (ref 4.0–5.6)

## 2024-06-01 MED ORDER — TRIAMCINOLONE ACETONIDE 0.5 % EX OINT: 1.0000 | TOPICAL_OINTMENT | Freq: Two times a day (BID) | CUTANEOUS | 0 refills | Status: AC

## 2024-06-01 NOTE — Assessment & Plan Note (Signed)
 Hypertension managed with lisinopril  10 mg daily

## 2024-06-01 NOTE — Assessment & Plan Note (Signed)
 Previous labs showed a slight increase in kidney function markers, possibly related to a recent procedure. - Recheck kidney function tests

## 2024-06-01 NOTE — Assessment & Plan Note (Signed)
 Hyperlipidemia is well controlled with current medication regimen.

## 2024-06-01 NOTE — Progress Notes (Signed)
 Established patient visit   Patient: Jane [name]   DOB: [birth year]   57 y.o. Female  MRN: [id number] Visit Date: 06/01/2024  Today's healthcare provider: Jon Eva, MD   Chief Complaint  Patient presents with   Medical Management of Chronic Issues    T2DM, requests thyroid  labs Would like to get shingles as well  Has dry/ itchy place in middle of back and would like for this to be looked at. Onset 65month    Subjective    HPI HPI     Medical Management of Chronic Issues    Additional comments: T2DM, requests thyroid  labs Would like to get shingles as well  Has dry/ itchy place in middle of back and would like for this to be looked at. Onset 65month       Last edited by Cherry Chiquita HERO, CMA on 06/01/2024  9:29 AM.       Discussed the use of AI scribe software for clinical note transcription with the patient, who gave verbal consent to proceed.  History of Present Illness   Jane [name] is a 58 year old female who presents with fatigue and hair loss.  She experiences significant fatigue, worsening over the past two weeks, with poor sleep quality at night. She suspects thyroid  dysfunction, as she has had similar symptoms previously. She also notes significant hair loss, which she associates with thyroid  issues. Her thyroid  levels were last checked six months ago.  She has a stiff neck and a sensation of a lump in her throat when swallowing, located in the thyroid  area. This sensation has been persistent and concerning.  Current medications include baby aspirin , Lipitor 40 mg daily, Prozac , Synthroid  125 mcg daily, and Linzess . She reports difficulty with Linzess  and constipation, which she attributes to pain medication and Ozempic . She has been on Ozempic , fluctuating between doses of 1 mg and 2 mg.  She experiences urinary issues, including an inability to hold her bladder and recurrent UTIs, despite being on antibiotics and medication for overactive  bladder. Her INR levels have been fluctuating, with recent bruising and a hematoma on her leg. She takes 6 mg of Coumadin  daily.         Medications: Outpatient Medications Prior to Visit  Medication Sig   AJOVY 225 MG/1.5ML SOAJ Inject 1 mL into the skin every 30 (thirty) days.   ALPRAZolam  (XANAX ) 1 MG tablet Take 1 tablet (1 mg total) by mouth 2 (two) times daily as needed. for anxiety   amphetamine -dextroamphetamine  (ADDERALL) 30 MG tablet Take 1 tablet by mouth 2 (two) times daily.   amphetamine -dextroamphetamine  (ADDERALL) 30 MG tablet Take 1 tablet by mouth 2 (two) times daily.   amphetamine -dextroamphetamine  (ADDERALL) 30 MG tablet Take 1 tablet by mouth 2 (two) times daily.   aspirin  EC 81 MG tablet Take 81 mg by mouth daily.   atorvastatin  (LIPITOR) 40 MG tablet TAKE 1 TABLET BY MOUTH DAILY   cetirizine  (ZYRTEC ) 10 MG tablet TAKE 1 TABLET BY MOUTH DAILY   cromolyn (OPTICROM) 4 % ophthalmic solution Place 1 drop into both eyes 2 (two) times daily.   cyclobenzaprine  (FLEXERIL ) 5 MG tablet Take 1 tablet (5 mg total) by mouth 3 (three) times daily as needed for muscle spasms.   famotidine  (PEPCID ) 20 MG tablet Take 20 mg by mouth daily.   FLUoxetine  (PROZAC ) 40 MG capsule Take 1 capsule (40 mg total) by mouth 2 (two) times daily.   HYDROcodone -acetaminophen  (NORCO) 10-325 MG tablet Take 1  tablet by mouth every 6 (six) hours as needed. To last 30 days from fill date   levothyroxine  (SYNTHROID ) 125 MCG tablet Take 1 tablet (125 mcg total) by mouth daily before breakfast.   LINZESS  290 MCG CAPS capsule TAKE ONE CAPSULE BY MOUTH DAILY   mirabegron  ER (MYRBETRIQ ) 50 MG TB24 tablet Take 1 tablet (50 mg total) by mouth daily.   Multiple Vitamin (MULTIVITAMIN) capsule Take 1 capsule by mouth daily.   nitrofurantoin  (MACRODANTIN ) 100 MG capsule Take 1 capsule (100 mg total) by mouth daily.   Omega-3 Fatty Acids (OMEGA 3 PO) Take 1 capsule by mouth daily.   pantoprazole  (PROTONIX ) 40 MG  tablet TAKE 1 TABLET BY MOUTH DAILY   pimecrolimus  (ELIDEL ) 1 % cream Apply to affected areas rash in body folds and legs once to twice daily until improved.   polyethylene glycol powder (GLYCOLAX /MIRALAX ) 17 GM/SCOOP powder TAKE 17 Gram BY MOUTH DAILY   potassium chloride  (KLOR-CON  M) 10 MEQ tablet Take 1 tablet (10 mEq total) by mouth 3 (three) times daily.   pregabalin  (LYRICA ) 100 MG capsule TAKE ONE CAPSULE BY MOUTH FOUR TIMES DAILY   RESTASIS  0.05 % ophthalmic emulsion Place 1 drop into both eyes 2 (two) times daily.   rOPINIRole  (REQUIP ) 2 MG tablet TAKE 1 TABLET BY MOUTH TWICE DAILY   Semaglutide , 1 MG/DOSE, 4 MG/3ML SOPN Inject 1 mg as directed once a week.   solifenacin  (VESICARE ) 5 MG tablet Take 1 tablet (5 mg total) by mouth daily.   traZODone  (DESYREL ) 150 MG tablet Take 1 tablet (150 mg total) by mouth at bedtime.   UBRELVY  100 MG TABS Take 100 mg by mouth daily as needed (migraines). May take a second 100 mg dose 2 hours later as needed for migraines   warfarin (COUMADIN ) 1 MG tablet Take one  along with 5 mg pill daily except for Mondays take 2 pills with 5 mg pill.  Equal to 6 mg daily except 7 mg on Monday.   warfarin (COUMADIN ) 5 MG tablet Take 7.5 mg daily except 5mg  on Saturday and Sunday. (Patient taking differently: Take 5 mg by mouth daily. Take with 1 mg to equal 6 mg daily)   No facility-administered medications prior to visit.    Review of Systems     Objective    BP 112/74 (BP Location: Left Arm, Patient Position: Sitting, Cuff Size: Normal)   Pulse 66   Temp 98.5 F (36.9 C) (Oral)   Ht 5' 8 (1.727 m)   Wt 236 lb 6.4 oz (107.2 kg)   LMP  (LMP Unknown)   SpO2 95%   BMI 35.94 kg/m    Physical Exam Vitals reviewed.  Constitutional:      General: She is not in acute distress.    Appearance: Normal appearance. She is well-developed. She is not diaphoretic.  HENT:     Head: Normocephalic and atraumatic.  Eyes:     General: No scleral icterus.     Conjunctiva/sclera: Conjunctivae normal.  Neck:     Thyroid : Thyromegaly present. No thyroid  tenderness.  Cardiovascular:     Rate and Rhythm: Normal rate and regular rhythm.     Pulses: Normal pulses.     Heart sounds: Normal heart sounds. No murmur heard. Pulmonary:     Effort: Pulmonary effort is normal. No respiratory distress.     Breath sounds: Normal breath sounds. No wheezing, rhonchi or rales.  Musculoskeletal:     Cervical back: Neck supple.  Right lower leg: No edema.     Left lower leg: No edema.  Lymphadenopathy:     Cervical: No cervical adenopathy.  Skin:    General: Skin is warm and dry.     Findings: No rash.  Neurological:     Mental Status: She is alert and oriented to person, place, and time. Mental status is at baseline.  Psychiatric:        Mood and Affect: Mood normal.        Behavior: Behavior normal.      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 - Recheck kidney function tests      Relevant Orders   Comprehensive metabolic panel with GFR   Hypothyroidism   Enlarged thyroid  gland (Goiter) with possible thyroid  dysfunction Reports fatigue, hair loss, and sensation of a lump in the throat when swallowing, suggestive of thyroid  dysfunction. Thyroid  appears enlarged on  examination. Last thyroid  check was six months ago. - Order thyroid  function tests - Order thyroid  ultrasound to assess for enlargement or nodules - Adjust Synthroid  dose based on thyroid  function test results      Relevant Orders   TSH   Hyperlipidemia associated with type 2 diabetes mellitus (HCC)   Hyperlipidemia is well controlled with current medication regimen.      Relevant Orders   Lipid panel   Comprehensive metabolic panel with GFR   Type 2 diabetes mellitus with hyperglycemia (HCC) - Primary   Diabetes is well controlled with an [name] of 5.7%.      Relevant Orders   POCT HgB [name] (Completed)   Other Visit Diagnoses       Globus sensation       Relevant Orders   US  THYROID      Enlarged thyroid        Relevant Orders   US  THYROID            Constipation, refractory to current therapy Constipation persists despite difficulty tolerating Linzess  and use of fiber supplement. Pain medication and Ozempic  may impact bowel movements. - Recommend Senna as an over-the-counter stimulant laxative to counteract constipation from pain medication  Overactive bladder with urinary incontinence and recurrent urinary tract infections Continues to experience urinary incontinence and recurrent UTIs. Current medications for overactive bladder are not effective.  Anticoagulation therapy with labile INR INR levels are fluctuating, with recent high INR causing bruising. No recent changes in medication that could affect INR. - Continue current Coumadin  regimen with monitoring - Follow up with INR check next week  Actinic keratosis of the back Dry, rough patch on the back identified as actinic keratosis, likely due to sun damage. Itchy but not previously present at dermatology visit. -  Prescribe triamcinolone  ointment for itch relief - Advise follow-up with dermatologist for potential cryotherapy  Vision changes Experiencing vision changes but unable to see an eye doctor due to  insurance limitations.  General Health Maintenance Due for shingles vaccination. Insurance now covers the vaccine. - Administer shingles vaccine today - Schedule second dose of shingles vaccine in 2-6 months       Return in about 6 months (around 12/02/2024) for CPE/ 2nd shingles .       Jon Eva, MD  [facility] [phone] (phone) [phone] (fax)  [facility]

## 2024-06-01 NOTE — Assessment & Plan Note (Signed)
 Enlarged thyroid  gland (Goiter) with possible thyroid  dysfunction Reports fatigue, hair loss, and sensation of a lump in the throat when swallowing, suggestive of thyroid  dysfunction. Thyroid  appears enlarged on examination. Last thyroid  check was six months ago. - Order thyroid  function tests - Order thyroid  ultrasound to assess for enlargement or nodules - Adjust Synthroid  dose based on thyroid  function test results

## 2024-06-01 NOTE — Assessment & Plan Note (Signed)
 Diabetes is well controlled with an [name] of 5.7

## 2024-06-02 ENCOUNTER — Encounter (INDEPENDENT_AMBULATORY_CARE_PROVIDER_SITE_OTHER): Payer: Self-pay | Admitting: Family Medicine

## 2024-06-02 ENCOUNTER — Ambulatory Visit: Payer: Self-pay | Admitting: Family Medicine

## 2024-06-02 ENCOUNTER — Ambulatory Visit (INDEPENDENT_AMBULATORY_CARE_PROVIDER_SITE_OTHER): Payer: MEDICAID | Admitting: Family Medicine

## 2024-06-02 VITALS — BP 132/84 | HR 59 | Temp 98.2°F | Ht 68.0 in | Wt 233.0 lb

## 2024-06-02 DIAGNOSIS — Z6835 Body mass index (BMI) 35.0-35.9, adult: Secondary | ICD-10-CM

## 2024-06-02 DIAGNOSIS — I871 Compression of vein: Secondary | ICD-10-CM | POA: Diagnosis not present

## 2024-06-02 DIAGNOSIS — E669 Obesity, unspecified: Secondary | ICD-10-CM | POA: Diagnosis not present

## 2024-06-02 DIAGNOSIS — E049 Nontoxic goiter, unspecified: Secondary | ICD-10-CM

## 2024-06-02 LAB — COMPREHENSIVE METABOLIC PANEL WITH GFR
ALT: 19 IU/L (ref 0–32)
AST: 19 IU/L (ref 0–40)
Albumin: 4.4 g/dL (ref 3.8–4.9)
Alkaline Phosphatase: 95 IU/L (ref 44–121)
BUN/Creatinine Ratio: 24 — ABNORMAL HIGH (ref 9–23)
BUN: 29 mg/dL — ABNORMAL HIGH (ref 6–24)
Bilirubin Total: 0.4 mg/dL (ref 0.0–1.2)
CO2: 26 mmol/L (ref 20–29)
Calcium: 9.7 mg/dL (ref 8.7–10.2)
Chloride: 103 mmol/L (ref 96–106)
Creatinine, Ser: 1.2 mg/dL — ABNORMAL HIGH (ref 0.57–1.00)
Globulin, Total: 2.6 g/dL (ref 1.5–4.5)
Glucose: 98 mg/dL (ref 70–99)
Potassium: 4.4 mmol/L (ref 3.5–5.2)
Sodium: 142 mmol/L (ref 134–144)
Total Protein: 7 g/dL (ref 6.0–8.5)
eGFR: 53 mL/min/1.73 — ABNORMAL LOW (ref >59)

## 2024-06-02 LAB — LIPID PANEL
Chol/HDL Ratio: 4.8 ratio — ABNORMAL HIGH (ref 0.0–4.4)
Cholesterol, Total: 197 mg/dL (ref 100–199)
HDL: 41 mg/dL (ref >39)
LDL Chol Calc (NIH): 113 mg/dL — ABNORMAL HIGH (ref 0–99)
Triglycerides: 248 mg/dL — ABNORMAL HIGH (ref 0–149)
VLDL Cholesterol Cal: 43 mg/dL — ABNORMAL HIGH (ref 5–40)

## 2024-06-02 LAB — TSH: TSH: 1.37 u[IU]/mL (ref 0.450–4.500)

## 2024-06-02 NOTE — Progress Notes (Unsigned)
   SUBJECTIVE:  Chief Complaint: Obesity  Interim History: Patient saw her PCP yesterday and she got labs done.  She mentions her thyroid  is swollen and she is getting an ultrasound done in 1 week. She is feeling very fatigued and is sleeping more.  She says she isn't able to do much due to this fatigue.  She hasn't been able to exercise much without needing to take a nap afterwards.  She is waiting for a test to figure out if she has overactive bladder or bladder spasms.  She has a beach trip in September  Jane [name] is here to discuss her progress with her obesity treatment plan. She is on the Category 3 Plan and states she is following her eating plan approximately 50 % of the time. She states she is exercising 30 minutes 5 times per week.   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)     Other   Morbid obesity (HCC)   Other Visit Diagnoses       Enlarged thyroid     -  Primary     BMI 35.0-35.9,adult           Vitals Temp: 98.2 F (36.8 C) BP: 132/84 Pulse Rate: (!) 59 SpO2: 96 %   Anthropometric Measurements Height: 5' 8 (1.727 m) Weight: 233 lb (105.7 kg) BMI (Calculated): 35.44 Weight at Last Visit: 233lb Weight Lost Since Last Visit: 0 Weight Gained Since Last Visit: 0 Starting Weight: 283lb Total Weight Loss (lbs): 50 lb (22.7 kg) Peak Weight: 290lb   Body Composition  Body Fat %: 48 % Fat Mass (lbs): 112.2 lbs Muscle Mass (lbs): 115.2 lbs Total Body Water (lbs): 91 lbs Visceral Fat Rating : 13   Other Clinical Data Fasting: no Labs: no Today's Visit #: 50 Starting Date: 03/29/20 Comments: Cat 3     ASSESSMENT AND PLAN: Assessment & Plan Enlarged thyroid  Thyroid  ultrasound is pending for next week.  Recent TSH was within normal limits.  Patient reports she has a feeling of a knot in her throat while swallowing.  Will follow-up on ultrasound results at  next appointment. May-Thurner syndrome Recent reevaluation of leg stents showing repeat clots.  Recent INR over goal of 3.2.  Patient has follow-up at Coumadin  clinic for reevaluation within the next 10 days. Obesity with starting BMI of 43.0  BMI 35.0-35.9,adult    Diet: Shaney is currently in the action stage of change. As such, her goal is to continue with weight loss efforts and has agreed to the Category 3 Plan.   Exercise:  No exercise has been prescribed at this time. Patient is not steady on her feet at this time to encourage physical activity especially since she is on blood thinners.  Behavior Modification:  We discussed the following Behavioral Modification Strategies today: increasing lean protein intake, decreasing simple carbohydrates, increasing vegetables, and no skipping meals.   Return in about 4 weeks (around 06/30/2024).   She was informed of the importance of frequent follow up visits to maximize her success with intensive lifestyle modifications for her multiple health conditions.  Attestation Statements:   Reviewed by clinician on day of visit: allergies, medications, problem list, medical history, surgical history, family history, social history, and previous encounter notes.     Adelita Cho, MD

## 2024-06-03 LAB — CULTURE, URINE COMPREHENSIVE

## 2024-06-03 NOTE — Assessment & Plan Note (Signed)
 Recent reevaluation of leg stents showing repeat clots.  Recent INR over goal of 3.2.  Patient has follow-up at Coumadin  clinic for reevaluation within the next 10 days.

## 2024-06-08 ENCOUNTER — Other Ambulatory Visit: Payer: Self-pay | Admitting: Family Medicine

## 2024-06-08 ENCOUNTER — Ambulatory Visit: Payer: MEDICAID | Attending: Nurse Practitioner | Admitting: Nurse Practitioner

## 2024-06-08 ENCOUNTER — Encounter: Payer: Self-pay | Admitting: Nurse Practitioner

## 2024-06-08 VITALS — BP 127/71 | HR 53 | Temp 97.2°F | Ht 69.0 in | Wt 230.0 lb

## 2024-06-08 DIAGNOSIS — M25562 Pain in left knee: Secondary | ICD-10-CM | POA: Insufficient documentation

## 2024-06-08 DIAGNOSIS — G894 Chronic pain syndrome: Secondary | ICD-10-CM | POA: Insufficient documentation

## 2024-06-08 DIAGNOSIS — M7918 Myalgia, other site: Secondary | ICD-10-CM | POA: Diagnosis present

## 2024-06-08 DIAGNOSIS — Z7985 Long-term (current) use of injectable non-insulin antidiabetic drugs: Secondary | ICD-10-CM

## 2024-06-08 DIAGNOSIS — Z79899 Other long term (current) drug therapy: Secondary | ICD-10-CM | POA: Insufficient documentation

## 2024-06-08 DIAGNOSIS — M25561 Pain in right knee: Secondary | ICD-10-CM | POA: Insufficient documentation

## 2024-06-08 DIAGNOSIS — M170 Bilateral primary osteoarthritis of knee: Secondary | ICD-10-CM | POA: Diagnosis present

## 2024-06-08 DIAGNOSIS — G8929 Other chronic pain: Secondary | ICD-10-CM | POA: Insufficient documentation

## 2024-06-08 DIAGNOSIS — E1142 Type 2 diabetes mellitus with diabetic polyneuropathy: Secondary | ICD-10-CM | POA: Diagnosis present

## 2024-06-08 DIAGNOSIS — M961 Postlaminectomy syndrome, not elsewhere classified: Secondary | ICD-10-CM | POA: Insufficient documentation

## 2024-06-08 MED ORDER — HYDROCODONE-ACETAMINOPHEN 10-325 MG PO TABS: 1.0000 | ORAL_TABLET | Freq: Four times a day (QID) | ORAL | 0 refills | Status: AC | PRN

## 2024-06-08 MED ORDER — HYDROCODONE-ACETAMINOPHEN 10-325 MG PO TABS: 1.0000 | ORAL_TABLET | Freq: Four times a day (QID) | ORAL | 0 refills | Status: DC | PRN

## 2024-06-08 NOTE — Progress Notes (Signed)
 Safety precautions to be maintained throughout the outpatient stay will include: orient to surroundings, keep bed in low position, maintain  Nursing Pain Medication Assessment:  Safety precautions to be maintained throughout the outpatient stay will include: orient to surroundings, keep bed in low position, maintain call bell within reach at all times, provide assistance with transfer out of bed and ambulation.  Medication Inspection Compliance: Pill count conducted under aseptic conditions, in front of the patient. Neither the pills nor the bottle was removed from the patient's sight at any time. Once count was completed pills were immediately returned to the patient in their original bottle.  Medication: Hydrocodone /APAP Pill/Patch Count: 47 of 120 pills/patches remain Pill/Patch Appearance: Markings consistent with prescribed medication Bottle Appearance: Standard pharmacy container. Clearly labeled. Filled Date: 7 / 39 / 2025 Last Medication intake:  Today

## 2024-06-08 NOTE — Progress Notes (Signed)
 PROVIDER NOTE: Interpretation of information contained herein should be left to medically-trained personnel. Specific patient instructions are provided elsewhere under Patient Instructions section of medical record. This document was created in part using AI and STT-dictation technology, any transcriptional errors that may result from this process are unintentional.  Patient: Jane [name]  Service: E/M   PCP: Jane Jane HERO, MD  DOB: 03/01/1966  DOS: 06/08/2024  Provider: [name] MARLA Blanch, NP  MRN: [id number]  Delivery: Face-to-face  Specialty: Interventional Pain Management  Type: Established Patient  Setting: Ambulatory outpatient facility  Specialty designation: 09  Referring Prov.: Jane [name], Jane HERO, MD  Location: Outpatient office facility       History of present illness (HPI) [name], a 58 y.o. year old female, is here today because of her Chronic pain syndrome [G89.4]. [name] primary complain today is Back Pain (Lower back)  Pertinent problems: [name] has a History of DVT (deep vein thrombosis); Bipolar 1 disorder (HCC); Depression, major, recurrent (HCC); Radicular low back pain; May-Thurner syndrome; Iliac vein stenosis, left; Morbid obesity (HCC); Peripheral neuropathy; Chronic low back pain (Bilateral) w/sciatica ((Bilateral); Chronic anticoagulation (Coumadin ); DDD (degenerative disc disease), cervical; DDD (degenerative disc disease), lumbar; Failed back surgical syndrome; Diabetic peripheral neuropathy (HCC); Neurogenic pain; Chronic musculoskeletal pain; and Chronic pain syndrome on their pertinent problem list.  Pain Assessment: Severity of Chronic pain is reported as a 8 /10. Location: Back Lower/radiates down left leg to foot. Onset: More than a month ago. Quality: Constant. Timing: Constant. Modifying factor(s): meds. Vitals:  height is 5' 9 (1.753 m) and weight is 230 lb (104.3 kg). Her temperature is 97.2 F (36.2 C) (abnormal). Her blood  pressure is 127/71 and her pulse is 53 (abnormal). Her oxygen saturation is 96%.  BMI: Estimated body mass index is 33.97 kg/m as calculated from the following:   Height as of this encounter: 5' 9 (1.753 m).   Weight as of this encounter: 230 lb (104.3 kg).  Last encounter: 03/16/2024. Last procedure: Visit date not found.  Reason for encounter: medication management.  No change in medical history since last visit.  Patient's pain is at baseline.  Patient continues multimodal pain regimen as prescribed.  States that it provides pain relief and improvement in functional status.   Pharmacotherapy Assessment   Hydrocodone -acetaminophen  (Norco) 10-325 mg tablet every 6 hours as needed for pain. MME=40 Monitoring: [place] PMP: PDMP reviewed during this encounter.       Pharmacotherapy: No side-effects or adverse reactions reported. Compliance: No problems identified. Effectiveness: Clinically acceptable.  Jane Nathanel PARAS, RN  06/08/2024  8:56 AM  Sign when Signing Visit Safety precautions to be maintained throughout the outpatient stay will include: orient to surroundings, keep bed in low position, maintain  Nursing Pain Medication Assessment:  Safety precautions to be maintained throughout the outpatient stay will include: orient to surroundings, keep bed in low position, maintain call bell within reach at all times, provide assistance with transfer out of bed and ambulation.  Medication Inspection Compliance: Pill count conducted under aseptic conditions, in front of the patient. Neither the pills nor the bottle was removed from the patient's sight at any time. Once count was completed pills were immediately returned to the patient in their original bottle.  Medication: Hydrocodone /APAP Pill/Patch Count: 47 of 120 pills/patches remain Pill/Patch Appearance: Markings consistent with prescribed medication Bottle Appearance: Standard pharmacy container. Clearly labeled. Filled Date: 7 / 19 /  2025 Last Medication intake:  Today    UDS:  Summary  Date Value Ref Range Status  06/17/2023 Note  Final    Comment:    ==================================================================== ToxASSURE Select 13 (MW) ==================================================================== Test                             Result       Flag       Units  Drug Present and Declared for Prescription Verification   Amphetamine                     2908         EXPECTED   ng/mg creat    Amphetamine  is available as a schedule II prescription drug.    Hydrocodone                     1339         EXPECTED   ng/mg creat   Dihydrocodeine                 221          EXPECTED   ng/mg creat   Norhydrocodone                 697          EXPECTED   ng/mg creat    Sources of hydrocodone  include scheduled prescription medications.    Dihydrocodeine and norhydrocodone are expected metabolites of    hydrocodone . Dihydrocodeine is also available as a scheduled    prescription medication.  Drug Absent but Declared for Prescription Verification   Alprazolam                      Not Detected UNEXPECTED ng/mg creat ==================================================================== Test                      Result    Flag   Units      Ref Range   Creatinine              61               mg/dL      >=79 ==================================================================== Declared Medications:  The flagging and interpretation on this report are based on the  following declared medications.  Unexpected results may arise from  inaccuracies in the declared medications.   **Note: The testing scope of this panel includes these medications:   Alprazolam  (Xanax )  Amphetamine  (Adderall)  Hydrocodone  (Norco)   **Note: The testing scope of this panel does not include the  following reported medications:   Acetaminophen  (Norco)  Aspirin   Atorvastatin  (Lipitor)  Cetirizine  (Zyrtec )  Estradiol  (Estrace )  Famotidine   (Pepcid )  Fluoxetine  (Prozac )  Ketoconazole  (Nizoral )  Levothyroxine  (Synthroid )  Linaclotide  (Linzess )  Methocarbamol  (Robaxin )  Multivitamin  Omega-3 Fatty Acids  Pantoprazole  (Protonix )  Polyethylene Glycol (MiraLAX )  Potassium (Klor-Con )  Pregabalin  (Lyrica )  Ropinirole  (Requip )  Semaglutide   Supplement  Trazodone  (Desyrel )  Valacyclovir  (Valtrex )  Vibegron  (Gemtesa )  Warfarin (Coumadin ) ==================================================================== For clinical consultation, please call [phone]. ====================================================================     No results found for: CBDTHCR No results found for: D8THCCBX No results found for: D9THCCBX  ROS  Constitutional: Denies any fever or chills Gastrointestinal: No reported hemesis, hematochezia, vomiting, or acute GI distress Musculoskeletal: low back pain (L>R) Neurological: No reported episodes of acute onset apraxia, aphasia, dysarthria, agnosia, amnesia, paralysis, loss of coordination, or loss of consciousness  Medication Review  ALPRAZolam , FLUoxetine , Fremanezumab-vfrm, HYDROcodone -acetaminophen ,  Omega-3 Fatty Acids, Semaglutide  (1 MG/DOSE), Ubrogepant , amphetamine -dextroamphetamine , aspirin  EC, atorvastatin , cetirizine , cromolyn, cycloSPORINE , cyclobenzaprine , famotidine , levothyroxine , linaclotide , mirabegron  ER, multivitamin, nitrofurantoin , pantoprazole , pimecrolimus , polyethylene glycol powder, potassium chloride , pregabalin , rOPINIRole , solifenacin , traZODone , triamcinolone  ointment, and warfarin  History Review  Allergy: [name] is allergic to aripiprazole, seroquel  [quetiapine  fumarate], chlorpromazine , gabapentin , and prednisone . Drug: [name]  reports no history of drug use. Alcohol:  reports no history of alcohol use. Tobacco:  reports that she has never smoked. She has never used smokeless tobacco. Social: [name]  reports that she has never smoked.  She has never used smokeless tobacco. She reports that she does not drink alcohol and does not use drugs. Medical:  has a past medical history of ADD (attention deficit disorder), Allergy, Anxiety, Arthritis (2021), Back pain, Bilateral swelling of feet, Bipolar 1 disorder (HCC), Bipolar 1 disorder (HCC), Bipolar disorder (HCC), Blood transfusion without reported diagnosis (2020), Chewing difficulty, Chronic fatigue syndrome, Chronic kidney disease, Clotting disorder (HCC), Constipation, Depression (2000), Diabetes mellitus without complication (HCC), Dyspnea, GERD (gastroesophageal reflux disease), Headache, High cholesterol, History of blood clots, History of DVT (deep vein thrombosis), Hypertension, Hypothyroidism, Joint pain, Neuromuscular disorder (HCC), Neuropathy, Obsessive-compulsive disorder, Peripheral vascular disease (HCC), Prediabetes, Respiratory failure requiring intubation (HCC), Restless leg syndrome, Shortness of breath, Sleep apnea, Thrombocytopenia (HCC) (09/15/2019), Trigger thumb of left hand (01/2018), and Trigger thumb of right hand. Surgical: [name]  has a past surgical history that includes Cholecystectomy; Trigger finger release (Right, 12/01/2017); Abdominal hysterectomy (06/2016); Lumbar fusion (11/21/2000); Lumbar spine surgery; Trigger finger release (Left, 01/26/2018); LOWER EXTREMITY VENOGRAPHY (N/A, 08/17/2018); Femoral-femoral Bypass Graft (Left, 11/24/2018); AV fistula placement (Left, 11/24/2018); LOWER EXTREMITY VENOGRAPHY (Bilateral, 03/09/2019); Patch angioplasty (Left, 03/30/2019); Ultrasound guidance for vascular access (Right, 03/30/2019); Femoral artery debridement (03/30/2019); Insertion of iliac stent (03/30/2019); Groin debridement (Left, 04/27/2019); Application if wound vac (Left, 04/27/2019); LOWER EXTREMITY VENOGRAPHY (Left, 08/16/2019); PERIPHERAL VASCULAR INTERVENTION (Left, 08/16/2019); Back surgery; Colonoscopy with propofol  (N/A, 11/22/2019); Knee  arthroscopy with meniscal repair (Left, 11/14/2020); LOWER EXTREMITY VENOGRAPHY (Left, 03/25/2022); PERIPHERAL VASCULAR THROMBECTOMY (Left, 03/25/2022); PERIPHERAL VASCULAR INTERVENTION (Left, 03/25/2022); Colonoscopy with propofol  (N/A, 12/09/2022); Spine surgery; LOWER EXTREMITY VENOGRAPHY (Left, 04/05/2024); LOWER EXTREMITY INTERVENTION (Left, 04/05/2024); PERIPHERAL VASCULAR THROMBECTOMY (Left, 04/05/2024); Peripheral Vascular Ultrasound/IVUS (Left, 04/05/2024); and VSD repair. Family: family history includes ADD / ADHD in her daughter; Anxiety disorder in her daughter; Bipolar disorder in her daughter, maternal aunt, and mother; Depression in her mother; Diabetes in her father; Drug abuse in her daughter and daughter; Early death in her brother; Heart disease in her brother, father, and mother; Hyperlipidemia in her brother, father, and mother; Hypertension in her brother, father, mother, and sister; Kidney disease in her brother; Obesity in her father and mother; Sleep apnea in her father and mother; Stroke in her mother; Suicidality in her maternal aunt.  Laboratory Chemistry Profile   Renal Lab Results  Component Value Date   BUN 29 (H) 06/01/2024   CREATININE 1.20 (H) 06/01/2024   BCR 24 (H) 06/01/2024   GFRAA 53 (L) 07/18/2020   GFRNONAA 43 (L) 04/06/2024    Hepatic Lab Results  Component Value Date   AST 19 06/01/2024   ALT 19 06/01/2024   ALBUMIN  4.4 06/01/2024   ALKPHOS 95 06/01/2024   HCVAB NON REACTIVE 11/06/2021    Electrolytes Lab Results  Component Value Date   NA 142 06/01/2024   K 4.4 06/01/2024   CL 103 06/01/2024   CALCIUM  9.7 06/01/2024   MG 2.1 08/06/2022   PHOS 4.0 04/04/2019  Bone Lab Results  Component Value Date   VD25OH 48.8 03/14/2023    Inflammation (CRP: Acute Phase) (ESR: Chronic Phase) Lab Results  Component Value Date   CRP 11 (H) 02/16/2020   ESRSEDRATE 33 02/16/2020   LATICACIDVEN 1.2 01/05/2020         Note: Above Lab results  reviewed.  Recent Imaging Review  VAS US  LOWER EXTREMITY VENOUS (DVT)  Lower Venous DVT Study  Patient Name:  Jane [name]  Date of Exam:   05/19/2024 Medical Rec #: [id number]        Accession #:    [id number] Date of Birth: Nov 10, 1965        Patient Gender: F Patient Age:   58 years Exam Location:  [address] Procedure:      VAS US  LOWER EXTREMITY VENOUS (DVT) Referring Phys: PENNE COLORADO  --------------------------------------------------------------------------------   Indications: Follow up. Other Indications: 11/24/18: 1. Left CFV to right CFV bypass with cryopreserved                    GSV 5.5 mm in diameter.                    2. Creation of left SFA to left FV AV fistula with                    interposition cryopreserved saphenous vein.                    03/30/19: Patch angioplasty left CFV.                    Stent left CIV, EIV, CFV, extended into the EIV and IVC.                    08/16/19: Balloon angioplasty left CIV in stent stenosis.                    Stent left CBF and FV.                    03/25/22: Stent left CFV, CIV, and EIV.                    Mechanical thrombectomy left CIV, EIV, CFV stent and FV.  Anticoagulation: Coumadin . Performing Technologist: King Pierre RVT    Examination Guidelines: A complete evaluation includes B-mode imaging, spectral Doppler, color Doppler, and power Doppler as needed of all accessible portions of each vessel. Bilateral testing is considered an integral part of a complete examination. Limited examinations for reoccurring indications may be performed as noted. The reflux portion of the exam is performed with the patient in reverse Trendelenburg.     +-----+---------------+---------+-----------+----------+--------------+ RIGHTCompressibilityPhasicitySpontaneityPropertiesThrombus Aging +-----+---------------+---------+-----------+----------+--------------+ CFV  Full           Yes      Yes                                  +-----+---------------+---------+-----------+----------+--------------+ SFJ  Full                    Yes                                 +-----+---------------+---------+-----------+----------+--------------+    +---------+---------------+---------+-----------+----------+--------------+ LEFT     CompressibilityPhasicitySpontaneityPropertiesThrombus Aging +---------+---------------+---------+-----------+----------+--------------+  CFV      Full           No       No                                  +---------+---------------+---------+-----------+----------+--------------+ FV Prox  Full                    No                                  +---------+---------------+---------+-----------+----------+--------------+ FV Mid   Full                    No                                  +---------+---------------+---------+-----------+----------+--------------+ FV DistalFull                    No                                  +---------+---------------+---------+-----------+----------+--------------+ POP      Full                    No                                  +---------+---------------+---------+-----------+----------+--------------+ PTV      Full                                                        +---------+---------------+---------+-----------+----------+--------------+ PERO     Full                                                        +---------+---------------+---------+-----------+----------+--------------+ GSV      Full                    Yes                                 +---------+---------------+---------+-----------+----------+--------------+    Summary: RIGHT: - No evidence of common femoral vein obstruction.   LEFT: - Findings consistent with chronic deep vein thrombosis involving the left posterior tibial veins, and left peroneal veins. - There is no evidence of  superficial venous thrombosis.    *See table(s) above for measurements and observations.  Electronically signed by Penne Colorado MD on 05/19/2024 at 5:09:37 PM.      Final   VAS US  IVC/ILIAC (VENOUS ONLY) IVC/ILIAC STUDY  Patient Name:  Jane [name]  Date of Exam:   05/19/2024 Medical Rec #: [id number]        Accession #:    [id number] Date of Birth: [birth year]  Patient Gender: F Patient Age:   68 years Exam Location:  [address] Procedure:      VAS US  IVC/ILIAC (VENOUS ONLY) Referring Phys: PENNE COLORADO  --------------------------------------------------------------------------------   Vascular Interventions: 11/24/18: 1. Left CFV to right CFV bypass with                         cryopreserved GSV 5.5 mm in diameter.                         2. Creation of left SFA to left FV AV fistula with                         interposition cryopreserved saphenous vein.                         03/30/19: Patch angioplasty left CFV.                         Stent left CIV, EIV, CFV, extended into the EIV and IVC.                         08/16/19: Balloon angioplasty left CIV in stent                         stenosis.                         Stent left CFV and FV.                         03/25/22: Stent left CFV, CIV, and EIV.                         Mechanical thrombectomy left CIV, EIV, CFV stent and FV.  Limitations: Obesity, air/bowel gas and patient discomfort.    Comparison Study: 01/12/24: Chronic thrombus in the left CIV and EIV. Chronic                   thrombus in the left lower extremity.  Performing Technologist: King Pierre RVT    Examination Guidelines: A complete evaluation includes B-mode imaging, spectral Doppler, color Doppler, and power Doppler as needed of all accessible portions of each vessel. Bilateral testing is considered an integral part of a complete examination. Limited examinations for reoccurring indications may be performed as noted.     IVC/Iliac Findings: +----------+------+--------+--------+    IVC    PatentThrombusComments +----------+------+--------+--------+ IVC Prox  patent                 +----------+------+--------+--------+ IVC Mid   patent                 +----------+------+--------+--------+ IVC Distalpatent                 +----------+------+--------+--------+    +-------------------+---------+-----------+---------+-----------+--------+         CIV        RT-PatentRT-ThrombusLT-PatentLT-ThrombusComments +-------------------+---------+-----------+---------+-----------+--------+ Common Iliac Prox                       patent                      +-------------------+---------+-----------+---------+-----------+--------+ Common  Iliac Mid                        patent                      +-------------------+---------+-----------+---------+-----------+--------+ Common Iliac Distal                     patent                      +-------------------+---------+-----------+---------+-----------+--------+     +-------------------------+---------+-----------+---------+-----------+--------+            EIV           RT-PatentRT-ThrombusLT-PatentLT-ThrombusComments +-------------------------+---------+-----------+---------+-----------+--------+ External Iliac Vein Prox                      patent                      +-------------------------+---------+-----------+---------+-----------+--------+ External Iliac Vein Mid                       patent                      +-------------------------+---------+-----------+---------+-----------+--------+ External Iliac Vein                           patent                      Distal                                                                    +-------------------------+---------+-----------+---------+-----------+--------+        Summary: IVC/Iliac: Patent IVC. Patent left  CIV and EIV.   *See table(s) above for measurements and observations.   Electronically signed by Penne Colorado MD on 05/19/2024 at 5:08:20 PM.       Final   Note: Reviewed        Physical Exam  Vitals: BP 127/71   Pulse (!) 53   Temp (!) 97.2 F (36.2 C)   Ht 5' 9 (1.753 m)   Wt 230 lb (104.3 kg)   LMP  (LMP Unknown)   SpO2 96%   BMI 33.97 kg/m  BMI: Estimated body mass index is 33.97 kg/m as calculated from the following:   Height as of this encounter: 5' 9 (1.753 m).   Weight as of this encounter: 230 lb (104.3 kg). Ideal: Ideal body weight: 66.2 kg (145 lb 15.1 oz) Adjusted ideal body weight: 81.5 kg (179 lb 9.1 oz) General appearance: Well nourished, well developed, and well hydrated. In no apparent acute distress Mental status: Alert, oriented x 3 (person, place, & time)       Respiratory: No evidence of acute respiratory distress Eyes: PERLA   Assessment   Diagnosis Status  1. Chronic pain syndrome   2. Diabetic peripheral neuropathy (HCC)   3. Chronic musculoskeletal pain   4. Failed back surgical syndrome   5. Bilateral primary osteoarthritis of knee   6. Bilateral chronic knee pain   7. Medication management    Controlled Controlled Controlled  Updated Problems: No problems updated.  Plan of Care  Problem-specific:  Assessment and Plan Will continue current medication regimen.  Prescription drug monitoring (PDMP) reviewed; findings consistent with use of prescribed medication and no evidence of narcotic misuse or abuse. Routine UDS ordered today.  No other new concerns or problems reported to this visit.  Schedule follow-up in 90 days for medication management.   Ms. Kimarie Barbour has a current medication list which includes the following long-term medication(s): amphetamine -dextroamphetamine , amphetamine -dextroamphetamine , amphetamine -dextroamphetamine , atorvastatin , cetirizine , fluoxetine , levothyroxine , linzess , mirabegron  er, pantoprazole ,  potassium chloride , pregabalin , ropinirole , trazodone , warfarin, and warfarin.  Pharmacotherapy (Medications Ordered): Meds ordered this encounter  Medications   HYDROcodone -acetaminophen  (NORCO) 10-325 MG tablet    Sig: Take 1 tablet by mouth every 6 (six) hours as needed. To last 30 days from fill date    Dispense:  120 tablet    Refill:  0   HYDROcodone -acetaminophen  (NORCO) 10-325 MG tablet    Sig: Take 1 tablet by mouth every 6 (six) hours as needed. To last 30 days from fill date    Dispense:  120 tablet    Refill:  0   HYDROcodone -acetaminophen  (NORCO) 10-325 MG tablet    Sig: Take 1 tablet by mouth every 6 (six) hours as needed. To last 30 days from fill date    Dispense:  120 tablet    Refill:  0   Orders:  Orders Placed This Encounter  Procedures   ToxASSURE Select 13 (MW), Urine    Volume: 30 ml(s). Minimum 3 ml of urine is needed. Document temperature of fresh sample. Indications: Long term (current) use of opiate analgesic (S20.108)    Release to patient:   Immediate        Return in about 3 months (around 09/08/2024) for (F2F), (MM), Jane Blanch NP.    Recent Visits Date Type Provider Dept  03/16/24 Office Visit Idalys Konecny K, NP [facility]  Showing recent visits within past 90 days and meeting all other requirements Today's Visits Date Type Provider Dept  06/08/24 Office Visit Tracker Mance K, NP [facility]  Showing today's visits and meeting all other requirements Future Appointments No visits were found meeting these conditions. Showing future appointments within next 90 days and meeting all other requirements  I discussed the assessment and treatment plan with the patient. The patient was provided an opportunity to ask questions and all were answered. The patient agreed with the plan and demonstrated an understanding of the instructions.  Patient advised to call back or seek an in-person evaluation if the symptoms or condition  worsens.  Duration of encounter: 30 minutes.  Total time on encounter, as per AMA guidelines included both the face-to-face and non-face-to-face time personally spent by the physician and/or other qualified health care professional(s) on the day of the encounter (includes time in activities that require the physician or other qualified health care professional and does not include time in activities normally performed by clinical staff). Physician's time may include the following activities when performed: Preparing to see the patient (e.g., pre-charting review of records, searching for previously ordered imaging, lab work, and nerve conduction tests) Review of prior analgesic pharmacotherapies. Reviewing PMP Interpreting ordered tests (e.g., lab work, imaging, nerve conduction tests) Performing post-procedure evaluations, including interpretation of diagnostic procedures Obtaining and/or reviewing separately obtained history Performing a medically appropriate examination and/or evaluation Counseling and educating the patient/family/caregiver Ordering medications, tests, or procedures Referring and communicating with other health care professionals (when not separately reported)  Documenting clinical information in the electronic or other health record Independently interpreting results (not separately reported) and communicating results to the patient/ family/caregiver Care coordination (not separately reported)  Note by: Williette Loewe K Illeana Edick, NP (TTS and AI technology used. I apologize for any typographical errors that were not detected and corrected.) Date: 06/08/2024; Time: 9:20 AM

## 2024-06-09 ENCOUNTER — Ambulatory Visit (INDEPENDENT_AMBULATORY_CARE_PROVIDER_SITE_OTHER): Payer: MEDICAID

## 2024-06-09 ENCOUNTER — Other Ambulatory Visit: Payer: Self-pay | Admitting: Family Medicine

## 2024-06-09 DIAGNOSIS — Z7901 Long term (current) use of anticoagulants: Secondary | ICD-10-CM

## 2024-06-09 DIAGNOSIS — M792 Neuralgia and neuritis, unspecified: Secondary | ICD-10-CM

## 2024-06-09 DIAGNOSIS — E1142 Type 2 diabetes mellitus with diabetic polyneuropathy: Secondary | ICD-10-CM

## 2024-06-09 LAB — POCT INR
INR: 3.1 — AB (ref 2.0–3.0)
PT: 37.8

## 2024-06-09 NOTE — Progress Notes (Signed)
 Patient here for PT/INR-to return between 2-3 weeks.

## 2024-06-10 ENCOUNTER — Ambulatory Visit (HOSPITAL_COMMUNITY)
Admission: RE | Admit: 2024-06-10 | Discharge: 2024-06-10 | Disposition: A | Discharge status: Home / Self Care | Payer: MEDICAID | Source: Ambulatory Visit | Attending: Family Medicine | Admitting: Family Medicine

## 2024-06-10 DIAGNOSIS — R09A2 Foreign body sensation, throat: Secondary | ICD-10-CM | POA: Diagnosis present

## 2024-06-10 DIAGNOSIS — E049 Nontoxic goiter, unspecified: Secondary | ICD-10-CM | POA: Diagnosis present

## 2024-06-10 LAB — TOXASSURE SELECT 13 (MW), URINE

## 2024-06-11 ENCOUNTER — Telehealth: Payer: Self-pay | Admitting: Urology

## 2024-06-11 DIAGNOSIS — N3281 Overactive bladder: Secondary | ICD-10-CM

## 2024-06-11 NOTE — Telephone Encounter (Signed)
 Patient called and stated that she is unable to schedule [facility]rod[facility] appointment with [facility] because they do not accept her insurance. She has Jane [name]. Please advise patient on how to proceed.

## 2024-06-15 ENCOUNTER — Other Ambulatory Visit: Payer: Self-pay | Admitting: *Deleted

## 2024-06-16 ENCOUNTER — Ambulatory Visit: Payer: MEDICAID | Admitting: Nurse Practitioner

## 2024-06-22 NOTE — Telephone Encounter (Signed)
 Pt informed of referral being changed to [facility].

## 2024-06-28 ENCOUNTER — Other Ambulatory Visit (HOSPITAL_COMMUNITY): Payer: Self-pay | Admitting: Psychiatry

## 2024-06-29 ENCOUNTER — Other Ambulatory Visit: Payer: Self-pay | Admitting: Family Medicine

## 2024-06-29 DIAGNOSIS — M792 Neuralgia and neuritis, unspecified: Secondary | ICD-10-CM

## 2024-06-29 DIAGNOSIS — E1142 Type 2 diabetes mellitus with diabetic polyneuropathy: Secondary | ICD-10-CM

## 2024-06-30 ENCOUNTER — Ambulatory Visit (INDEPENDENT_AMBULATORY_CARE_PROVIDER_SITE_OTHER): Payer: MEDICAID

## 2024-06-30 DIAGNOSIS — Z7901 Long term (current) use of anticoagulants: Secondary | ICD-10-CM

## 2024-06-30 LAB — POCT INR
INR: 3.6 — AB (ref 2.0–3.0)
POC INR: 3.6
PT: 44.2

## 2024-06-30 NOTE — Patient Instructions (Signed)
 Description   6 mg daily and 5 mg on M-W-F return to clinic in 2-3 weeks.

## 2024-07-01 ENCOUNTER — Ambulatory Visit (INDEPENDENT_AMBULATORY_CARE_PROVIDER_SITE_OTHER): Payer: MEDICAID | Admitting: Family Medicine

## 2024-07-01 ENCOUNTER — Encounter (INDEPENDENT_AMBULATORY_CARE_PROVIDER_SITE_OTHER): Payer: Self-pay | Admitting: Family Medicine

## 2024-07-01 VITALS — BP 146/74 | HR 60 | Temp 97.9°F | Ht 68.0 in | Wt 235.0 lb

## 2024-07-01 DIAGNOSIS — E1165 Type 2 diabetes mellitus with hyperglycemia: Secondary | ICD-10-CM

## 2024-07-01 DIAGNOSIS — E1169 Type 2 diabetes mellitus with other specified complication: Secondary | ICD-10-CM | POA: Diagnosis not present

## 2024-07-01 DIAGNOSIS — I871 Compression of vein: Secondary | ICD-10-CM | POA: Diagnosis not present

## 2024-07-01 DIAGNOSIS — Z6835 Body mass index (BMI) 35.0-35.9, adult: Secondary | ICD-10-CM

## 2024-07-01 DIAGNOSIS — E785 Hyperlipidemia, unspecified: Secondary | ICD-10-CM | POA: Diagnosis not present

## 2024-07-01 DIAGNOSIS — E669 Obesity, unspecified: Secondary | ICD-10-CM

## 2024-07-01 DIAGNOSIS — Z7985 Long-term (current) use of injectable non-insulin antidiabetic drugs: Secondary | ICD-10-CM

## 2024-07-01 MED ORDER — SEMAGLUTIDE (1 MG/DOSE) 4 MG/3ML ~~LOC~~ SOPN: 1.0000 mg | PEN_INJECTOR | SUBCUTANEOUS | 0 refills | Status: DC

## 2024-07-01 NOTE — Progress Notes (Signed)
 SUBJECTIVE:  Chief Complaint: Obesity  Interim History: patient has been at home most of the last few weeks.  She just joined a gym and hasn't gone yet but has plans to start going.  Patient underwent ultrasound of her thyroid  recently and it was normal with no nodules.  Patient is still having some of the same symptoms she was having previously. Her INR has also been labile.  Denies new supplements, medications, antibiotic usage.  No plans for Labor Day weekend.  Patient reports that her food stamps were completely cut off.  She had to give up her two dogs due to financial constraints. Budget for food is $200.  She is going to [facility] in September.   Jane [name] is here to discuss her progress with her obesity treatment plan. She is on the Category 3 Plan and states she is following her eating plan approximately 50 % of the time. She states she is not exercising.   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)     Endocrine   Hyperlipidemia associated with type 2 diabetes mellitus (HCC) - Primary   Relevant Medications   Semaglutide , 1 MG/DOSE, 4 MG/3ML SOPN   Type 2 diabetes mellitus with hyperglycemia (HCC)   Relevant Medications   Semaglutide , 1 MG/DOSE, 4 MG/3ML SOPN     Other   Morbid obesity (HCC)   Relevant Medications   Semaglutide , 1 MG/DOSE, 4 MG/3ML SOPN   Other Visit Diagnoses       BMI 35.0-35.9,adult           Vitals Temp: 97.9 F (36.6 C) BP: (!) 146/74 Pulse Rate: 60 SpO2: 100 %   Anthropometric Measurements Height: 5' 8 (1.727 m) Weight: 235 lb (106.6 kg) BMI (Calculated): 35.74 Weight at Last Visit: 233 lb Weight Lost Since Last Visit: 0 Weight Gained Since Last Visit: 2 Starting Weight: 283 lb Total Weight Loss (lbs): 48 lb (21.8 kg)   Body Composition  Body Fat %: 47.4 % Fat Mass (lbs): 111.6 lbs Muscle Mass (lbs): 117.6 lbs Total Body Water  (lbs): 88.2 lbs Visceral Fat Rating : 13   Other Clinical Data Fasting: no Labs: no Today's Visit #: 51 Starting Date: 03/29/20 Comments: Cat 3     ASSESSMENT AND PLAN: Assessment & Plan Hyperlipidemia associated with type 2 diabetes mellitus (HCC) On lipitor without transaminitis.  Patient has myalgias but has chronic pain.  Previously LDL slightly elevated and HDL not yet at goal.  Patient has been working on dietary modifications as well as lifestyle changes with incorporation of consistent physical activity when she is able to in addition to pharmacotherapy.  Will repeat labs at the end of this year. May-Thurner syndrome Recent elevated and then fluctuating INR with no clear etiology as to why.  Will follow-up on INR as at next appointment.  Patient to continue to see vein and vascular. Obesity with starting BMI of 43.0  BMI 35.0-35.9,adult  Type 2 diabetes mellitus with hyperglycemia, without long-term current use of insulin  (HCC) Tolerating semaglutide  with occasional control of intake but patient is on limited resources at this time with some mild food instability.  She is able to get what is available at certain food pantry's and often times that is higher in carbohydrate.  She is focusing on the protein that she can get in when that is available.  Follow-up on repeat labs at end of year.  May consider referring patient to [facility] program.  Refill semaglutide  at current dose as patient is not experiencing any GI side effects.   Diet: Jane [name] is currently in the action stage of change. As such, her goal is to continue with weight loss efforts and has agreed to practicing portion control and making smarter food choices, such as increasing vegetables and decreasing simple carbohydrates.   Exercise:  For substantial health benefits, adults should do at least 150 minutes (2 hours and 30 minutes) a week of moderate-intensity, or 75 minutes (1 hour and 15 minutes) a week of  vigorous-intensity aerobic physical activity, or an equivalent combination of moderate- and vigorous-intensity aerobic activity. Aerobic activity should be performed in episodes of at least 10 minutes, and preferably, it should be spread throughout the week.  Behavior Modification:  We discussed the following Behavioral Modification Strategies today: increasing lean protein intake, decreasing simple carbohydrates, increasing vegetables, meal planning and cooking strategies, keeping healthy foods in the home, and planning for success.   Return in about 6 weeks (around 08/12/2024).   She was informed of the importance of frequent follow up visits to maximize her success with intensive lifestyle modifications for her multiple health conditions.  Attestation Statements:   Reviewed by clinician on day of visit: allergies, medications, problem list, medical history, surgical history, family history, social history, and previous encounter notes.     Adelita Cho, MD

## 2024-07-11 NOTE — Assessment & Plan Note (Signed)
 Tolerating semaglutide  with occasional control of intake but patient is on limited resources at this time with some mild food instability.  She is able to get what is available at certain food pantry's and often times that is higher in carbohydrate.  She is focusing on the protein that she can get in when that is available.  Follow-up on repeat labs at end of year.  May consider referring patient to [facility] program.  Refill semaglutide  at current dose as patient is not experiencing any GI side effects.

## 2024-07-11 NOTE — Assessment & Plan Note (Signed)
 Recent elevated and then fluctuating INR with no clear etiology as to why.  Will follow-up on INR as at next appointment.  Patient to continue to see vein and vascular.

## 2024-07-11 NOTE — Assessment & Plan Note (Signed)
 On lipitor without transaminitis.  Patient has myalgias but has chronic pain.  Previously LDL slightly elevated and HDL not yet at goal.  Patient has been working on dietary modifications as well as lifestyle changes with incorporation of consistent physical activity when she is able to in addition to pharmacotherapy.  Will repeat labs at the end of this year.

## 2024-07-14 ENCOUNTER — Ambulatory Visit (INDEPENDENT_AMBULATORY_CARE_PROVIDER_SITE_OTHER): Payer: MEDICAID

## 2024-07-14 DIAGNOSIS — Z7901 Long term (current) use of anticoagulants: Secondary | ICD-10-CM

## 2024-07-14 LAB — POCT INR
INR: 3.9 — AB (ref 2.0–3.0)
PT: 47.2

## 2024-07-14 NOTE — Progress Notes (Signed)
Patient here for PT/INR.  

## 2024-07-14 NOTE — Patient Instructions (Signed)
 Description   Hold for 2 days 5 mg daily except 6 mg M-W-F return to clinic in 2-3 weeks.

## 2024-07-15 ENCOUNTER — Encounter: Payer: Self-pay | Admitting: Family Medicine

## 2024-07-15 ENCOUNTER — Ambulatory Visit: Payer: MEDICAID | Admitting: Family Medicine

## 2024-07-15 VITALS — BP 141/73 | HR 60 | Ht 68.0 in | Wt 238.5 lb

## 2024-07-15 DIAGNOSIS — E1159 Type 2 diabetes mellitus with other circulatory complications: Secondary | ICD-10-CM

## 2024-07-15 DIAGNOSIS — Z7901 Long term (current) use of anticoagulants: Secondary | ICD-10-CM

## 2024-07-15 DIAGNOSIS — Z86718 Personal history of other venous thrombosis and embolism: Secondary | ICD-10-CM

## 2024-07-15 DIAGNOSIS — M1712 Unilateral primary osteoarthritis, left knee: Secondary | ICD-10-CM

## 2024-07-15 DIAGNOSIS — I152 Hypertension secondary to endocrine disorders: Secondary | ICD-10-CM | POA: Diagnosis not present

## 2024-07-15 MED ORDER — LOSARTAN POTASSIUM 25 MG PO TABS: 25.0000 mg | ORAL_TABLET | Freq: Every day | ORAL | 1 refills | Status: DC

## 2024-07-15 NOTE — Assessment & Plan Note (Signed)
 Recurrent DVTs requiring lifelong anticoagulation. Currently on warfarin due to previous DOACs failure. INR levels unstable with recent increase. She is frustrated with current regimen and interested in exploring alternatives. - Refer to hematology at [facility] for evaluation of anticoagulation regimen and potential alternatives to warfarin - Ensure referral specifies consultation with a physician

## 2024-07-15 NOTE — Progress Notes (Signed)
 Established patient visit   Patient: Jane [name]   DOB: Mar 31, 1966   58 y.o. Female  MRN: [id number] Visit Date: 07/15/2024  Today's healthcare provider: Jon Eva, MD   Chief Complaint  Patient presents with   Hypertension    Patient is present due to elevated blood pressure. She reports yesterday reading was 158/87 when coming to Coumadin  clinic. She has also had high readings at home. She reports Monday when she got up she was stumbling, went home and had a headache and slept the remainder of the day. Still off balance Tuesday and slept all day along with constant throbbing in the head. Coumadin  level was 3.9. BP elevated at last [facility] visit in August.    Subjective    Hypertension   HPI     Hypertension    Additional comments: Patient is present due to elevated blood pressure. She reports yesterday reading was 158/87 when coming to Coumadin  clinic. She has also had high readings at home. She reports Monday when she got up she was stumbling, went home and had a headache and slept the remainder of the day. Still off balance Tuesday and slept all day along with constant throbbing in the head. Coumadin  level was 3.9. BP elevated at last [facility] visit in August.       Last edited by Lilian Fitzpatrick, CMA on 07/15/2024  4:05 PM.       Discussed the use of AI scribe software for clinical note transcription with the patient, who gave verbal consent to proceed.  History of Present Illness   Jane [name] is a 58 year old female with hypertension and recurrent DVTs who presents with elevated blood pressure and headaches.  She experiences elevated blood pressure with diastolic readings over 90 and persistent headaches. On Monday, she had a severe headache unresponsive to her usual migraine medication, accompanied by dizziness and stumbling. Blood pressure monitoring since Tuesday shows elevated readings. Her INR is also high.  She is on lifelong warfarin therapy due to  recurrent DVTs and continues to experience blood clots, including a recent clot in a stent. She struggles with maintaining a stable INR despite medication adherence.  She previously took lisinopril , which caused hypotension. She has gained approximately five pounds, now weighing around 238 pounds. Her symptoms affect her ability to work three days a week. No new vision changes, and no weakness or numbness on one side of the body, although symptoms are always on the left side.         Medications: Outpatient Medications Prior to Visit  Medication Sig   AJOVY 225 MG/1.5ML SOAJ Inject 1 mL into the skin every 30 (thirty) days.   ALPRAZolam  (XANAX ) 1 MG tablet Take 1 tablet (1 mg total) by mouth 2 (two) times daily as needed. for anxiety   amphetamine -dextroamphetamine  (ADDERALL) 30 MG tablet Take 1 tablet by mouth 2 (two) times daily.   amphetamine -dextroamphetamine  (ADDERALL) 30 MG tablet Take 1 tablet by mouth 2 (two) times daily.   amphetamine -dextroamphetamine  (ADDERALL) 30 MG tablet Take 1 tablet by mouth 2 (two) times daily.   aspirin  EC 81 MG tablet Take 81 mg by mouth daily.   atorvastatin  (LIPITOR) 40 MG tablet TAKE 1 TABLET BY MOUTH DAILY   cetirizine  (ZYRTEC ) 10 MG tablet TAKE 1 TABLET BY MOUTH DAILY   cromolyn (OPTICROM) 4 % ophthalmic solution Place 1 drop into both eyes 2 (two) times daily.   cyclobenzaprine  (FLEXERIL ) 5 MG tablet Take 1 tablet (5 mg  total) by mouth 3 (three) times daily as needed for muscle spasms.   famotidine  (PEPCID ) 20 MG tablet Take 20 mg by mouth daily.   FLUoxetine  (PROZAC ) 40 MG capsule Take 1 capsule (40 mg total) by mouth 2 (two) times daily.   HYDROcodone -acetaminophen  (NORCO) 10-325 MG tablet Take 1 tablet by mouth every 6 (six) hours as needed. To last 30 days from fill date   [START ON 07/20/2024] HYDROcodone -acetaminophen  (NORCO) 10-325 MG tablet Take 1 tablet by mouth every 6 (six) hours as needed. To last 30 days from fill date   [START ON  08/19/2024] HYDROcodone -acetaminophen  (NORCO) 10-325 MG tablet Take 1 tablet by mouth every 6 (six) hours as needed. To last 30 days from fill date   levothyroxine  (SYNTHROID ) 125 MCG tablet TAKE 1 TABLET BY MOUTH DAILY BEFORE BREAKFAST   LINZESS  290 MCG CAPS capsule TAKE ONE CAPSULE BY MOUTH DAILY   mirabegron  ER (MYRBETRIQ ) 50 MG TB24 tablet Take 1 tablet (50 mg total) by mouth daily.   Multiple Vitamin (MULTIVITAMIN) capsule Take 1 capsule by mouth daily.   nitrofurantoin  (MACRODANTIN ) 100 MG capsule Take 1 capsule (100 mg total) by mouth daily.   Omega-3 Fatty Acids (OMEGA 3 PO) Take 1 capsule by mouth daily.   pantoprazole  (PROTONIX ) 40 MG tablet TAKE 1 TABLET BY MOUTH DAILY   pimecrolimus  (ELIDEL ) 1 % cream Apply to affected areas rash in body folds and legs once to twice daily until improved.   polyethylene glycol powder (GLYCOLAX /MIRALAX ) 17 GM/SCOOP powder TAKE 17 Gram BY MOUTH DAILY   potassium chloride  (KLOR-CON  M) 10 MEQ tablet Take 1 tablet (10 mEq total) by mouth 3 (three) times daily.   pregabalin  (LYRICA ) 100 MG capsule TAKE ONE CAPSULE BY MOUTH FOUR TIMES DAILY   RESTASIS  0.05 % ophthalmic emulsion Place 1 drop into both eyes 2 (two) times daily.   rOPINIRole  (REQUIP ) 2 MG tablet TAKE 1 TABLET BY MOUTH TWICE DAILY   Semaglutide , 1 MG/DOSE, 4 MG/3ML SOPN Inject 1 mg as directed once a week.   solifenacin  (VESICARE ) 5 MG tablet Take 1 tablet (5 mg total) by mouth daily.   traZODone  (DESYREL ) 150 MG tablet Take 1 tablet (150 mg total) by mouth at bedtime.   triamcinolone  ointment (KENALOG ) 0.5 % Apply 1 Application topically 2 (two) times daily.   UBRELVY  100 MG TABS Take 100 mg by mouth daily as needed (migraines). May take a second 100 mg dose 2 hours later as needed for migraines   warfarin (COUMADIN ) 1 MG tablet Take one  along with 5 mg pill daily except for Mondays take 2 pills with 5 mg pill.  Equal to 6 mg daily except 7 mg on Monday.   warfarin (COUMADIN ) 5 MG tablet Take  7.5 mg daily except 5mg  on Saturday and Sunday. (Patient taking differently: Take 5 mg by mouth daily. Take with 1 mg to equal 6 mg daily)   No facility-administered medications prior to visit.    Review of Systems     Objective    BP (!) 141/73 (BP Location: Left Arm, Patient Position: Sitting, Cuff Size: Normal)   Pulse 60   Ht 5' 8 (1.727 m)   Wt 238 lb 8 oz (108.2 kg)   LMP  (LMP Unknown)   SpO2 94%   BMI 36.26 kg/m    Physical Exam Vitals reviewed.  Constitutional:      General: She is not in acute distress.    Appearance: Normal appearance. She is well-developed. She is  not diaphoretic.  HENT:     Head: Normocephalic and atraumatic.  Eyes:     General: No scleral icterus.    Conjunctiva/sclera: Conjunctivae normal.  Neck:     Thyroid : No thyromegaly.  Cardiovascular:     Rate and Rhythm: Normal rate and regular rhythm.     Heart sounds: Normal heart sounds.  Pulmonary:     Effort: Pulmonary effort is normal. No respiratory distress.     Breath sounds: Normal breath sounds. No wheezing, rhonchi or rales.  Musculoskeletal:     Cervical back: Neck supple.     Right lower leg: No edema.     Left lower leg: No edema.  Lymphadenopathy:     Cervical: No cervical adenopathy.  Skin:    General: Skin is warm and dry.     Findings: No rash.  Neurological:     Mental Status: She is alert and oriented to person, place, and time. Mental status is at baseline.     Cranial Nerves: No cranial nerve deficit.     Motor: No weakness.     Coordination: Coordination normal.  Psychiatric:        Mood and Affect: Mood normal.        Behavior: Behavior normal.      No results found for any visits on 07/15/24.  Assessment & Plan     Problem List Items Addressed This Visit       Cardiovascular and Mediastinum   Hypertension associated with diabetes (HCC) - Primary (Chronic)   Persistent elevated blood pressure with symptoms of headache and dizziness. Previous lisinopril   therapy led to hypotension. Current diastolic readings over 90 mmHg suggest hypertensive urgency without acute neurological deficits. - Initiate losartan  25 mg daily - Schedule follow-up in one month to reassess blood pressure and adjust medication as needed - Perform laboratory tests at follow-up      Relevant Medications   losartan  (COZAAR ) 25 MG tablet     Musculoskeletal and Integument   Arthritis of left knee     Other   History of DVT (deep vein thrombosis)   Relevant Orders   Ambulatory referral to Hematology / Oncology   Chronic anticoagulation (Coumadin )   Recurrent DVTs requiring lifelong anticoagulation. Currently on warfarin due to previous DOACs failure. INR levels unstable with recent increase. She is frustrated with current regimen and interested in exploring alternatives. - Refer to hematology at [facility] for evaluation of anticoagulation regimen and potential alternatives to warfarin - Ensure referral specifies consultation with a physician      Relevant Orders   Ambulatory referral to Hematology / Oncology        Severe left knee osteoarthritis, planned for total knee replacement Severe osteoarthritis with bone-on-bone contact. Previous conservative treatments ineffective. She is considering total knee replacement and understands the need for postoperative rehabilitation to avoid complications. - Discuss potential benefits and risks of knee replacement surgery, emphasizing the importance of postoperative rehabilitation - Coordinate anticoagulation therapy management around the time of surgery, including bridging with injections as necessary  Painful hammertoe, planned for surgical correction Painful hammertoe requiring surgical intervention. She acknowledges the need for correction. - Coordinate anticoagulation management around the time of hammertoe surgery        Return in about 4 weeks (around 08/12/2024) for BP f/u.       Jon Eva, MD  [facility] [phone] (phone) [phone] (fax)  [facility]

## 2024-07-15 NOTE — Assessment & Plan Note (Signed)
 Persistent elevated blood pressure with symptoms of headache and dizziness. Previous lisinopril  therapy led to hypotension. Current diastolic readings over 90 mmHg suggest hypertensive urgency without acute neurological deficits. - Initiate losartan  25 mg daily - Schedule follow-up in one month to reassess blood pressure and adjust medication as needed - Perform laboratory tests at follow-up

## 2024-07-19 ENCOUNTER — Ambulatory Visit: Payer: MEDICAID | Admitting: Podiatry

## 2024-07-19 ENCOUNTER — Telehealth: Payer: Self-pay | Admitting: Nurse Practitioner

## 2024-07-19 NOTE — Telephone Encounter (Signed)
 Pharmacy told patient they do not have any scripts to fill for her. Please call pharmacy and then patient

## 2024-07-19 NOTE — Telephone Encounter (Signed)
 Medication is scheduled for 07/20/2024

## 2024-07-20 ENCOUNTER — Telehealth: Payer: Self-pay | Admitting: Nurse Practitioner

## 2024-07-20 NOTE — Telephone Encounter (Signed)
 PATIENT PICKED UP LAS SCRIPT ON 08-18 SO SHE CAN FILL NEXT SCRIPT ON 07-21-2024. PATIENT CALLED AND INFORMED.

## 2024-07-20 NOTE — Telephone Encounter (Signed)
 Patient wants to pick meds today. Pharmacy says she cannot pick up until 9-18 without a call from out pharmacy. Please call pharmacy

## 2024-07-26 ENCOUNTER — Other Ambulatory Visit (HOSPITAL_COMMUNITY): Payer: Self-pay | Admitting: Psychiatry

## 2024-08-04 ENCOUNTER — Ambulatory Visit: Payer: MEDICAID

## 2024-08-11 ENCOUNTER — Ambulatory Visit: Payer: MEDICAID | Admitting: Podiatry

## 2024-08-12 NOTE — Addendum Note (Signed)
 Addended byBETHA CORIE PLATER on: 08/12/2024 09:54 AM   Modules accepted: Orders

## 2024-08-16 ENCOUNTER — Encounter: Payer: Self-pay | Admitting: Family Medicine

## 2024-08-16 ENCOUNTER — Inpatient Hospital Stay: Payer: MEDICAID | Attending: Oncology | Admitting: Oncology

## 2024-08-16 ENCOUNTER — Telehealth: Payer: Self-pay | Admitting: *Deleted

## 2024-08-16 ENCOUNTER — Ambulatory Visit: Payer: MEDICAID | Admitting: Urology

## 2024-08-16 ENCOUNTER — Ambulatory Visit (INDEPENDENT_AMBULATORY_CARE_PROVIDER_SITE_OTHER): Payer: MEDICAID | Admitting: Family Medicine

## 2024-08-16 ENCOUNTER — Encounter: Payer: Self-pay | Admitting: Oncology

## 2024-08-16 VITALS — BP 136/70 | HR 65 | Ht 70.0 in | Wt 241.4 lb

## 2024-08-16 VITALS — BP 144/63 | HR 58 | Temp 97.3°F | Resp 18 | Ht 70.0 in | Wt 241.0 lb

## 2024-08-16 VITALS — BP 134/72 | HR 58 | Ht 70.0 in | Wt 242.2 lb

## 2024-08-16 DIAGNOSIS — D696 Thrombocytopenia, unspecified: Secondary | ICD-10-CM | POA: Diagnosis not present

## 2024-08-16 DIAGNOSIS — I871 Compression of vein: Secondary | ICD-10-CM | POA: Diagnosis present

## 2024-08-16 DIAGNOSIS — Z7901 Long term (current) use of anticoagulants: Secondary | ICD-10-CM | POA: Diagnosis not present

## 2024-08-16 DIAGNOSIS — N289 Disorder of kidney and ureter, unspecified: Secondary | ICD-10-CM | POA: Insufficient documentation

## 2024-08-16 DIAGNOSIS — Z86718 Personal history of other venous thrombosis and embolism: Secondary | ICD-10-CM | POA: Diagnosis not present

## 2024-08-16 DIAGNOSIS — I82409 Acute embolism and thrombosis of unspecified deep veins of unspecified lower extremity: Secondary | ICD-10-CM

## 2024-08-16 DIAGNOSIS — N3946 Mixed incontinence: Secondary | ICD-10-CM | POA: Diagnosis not present

## 2024-08-16 DIAGNOSIS — E1159 Type 2 diabetes mellitus with other circulatory complications: Secondary | ICD-10-CM

## 2024-08-16 DIAGNOSIS — I152 Hypertension secondary to endocrine disorders: Secondary | ICD-10-CM | POA: Diagnosis not present

## 2024-08-16 DIAGNOSIS — N3281 Overactive bladder: Secondary | ICD-10-CM | POA: Diagnosis not present

## 2024-08-16 DIAGNOSIS — E876 Hypokalemia: Secondary | ICD-10-CM

## 2024-08-16 LAB — URINALYSIS, COMPLETE
Bilirubin, UA: NEGATIVE
Glucose, UA: NEGATIVE
Ketones, UA: NEGATIVE
Nitrite, UA: POSITIVE — AB
Protein,UA: NEGATIVE
Specific Gravity, UA: 1.015 (ref 1.005–1.030)
Urobilinogen, Ur: 0.2 mg/dL (ref 0.2–1.0)
pH, UA: 7.5 (ref 5.0–7.5)

## 2024-08-16 LAB — POCT INR: POC INR: 2.4

## 2024-08-16 LAB — MICROSCOPIC EXAMINATION: WBC, UA: >30 /HPF — AB (ref 0–5)

## 2024-08-16 MED ORDER — DABIGATRAN ETEXILATE MESYLATE 150 MG PO CAPS: 150.0000 mg | ORAL_CAPSULE | Freq: Two times a day (BID) | ORAL | 0 refills | Status: DC

## 2024-08-16 MED ORDER — POTASSIUM CHLORIDE CRYS ER 10 MEQ PO TBCR: 10.0000 meq | EXTENDED_RELEASE_TABLET | Freq: Three times a day (TID) | ORAL | 3 refills | Status: AC

## 2024-08-16 MED ORDER — CIPROFLOXACIN HCL 250 MG PO TABS: 250.0000 mg | ORAL_TABLET | Freq: Two times a day (BID) | ORAL | 0 refills | Status: DC

## 2024-08-16 MED ORDER — LOSARTAN POTASSIUM 25 MG PO TABS
12.5000 mg | ORAL_TABLET | Freq: Every day | ORAL | Status: DC
Start: 2024-08-16 — End: 2024-08-27

## 2024-08-16 MED ORDER — LIDOCAINE HCL URETHRAL/MUCOSAL 2 % EX GEL
1.0000 | Freq: Once | CUTANEOUS | Status: AC
  Administered 2024-08-16: 1 via URETHRAL

## 2024-08-16 NOTE — Telephone Encounter (Signed)
 RN received message from Dr. Jacobo regarding PT/INR results from today, patients INR is 2.4. Dr. Jacobo recommends patient stop her coumadin  today and start Pradaxa twice a day on Wednesday. RN attempted 2 calls to patient with update, left voicemail and direct number to return call with any questions.

## 2024-08-16 NOTE — Patient Instructions (Signed)
 Description   continue 5 mg daily except 6 mg M-W-F return to clinic in 4 weeks.

## 2024-08-16 NOTE — Assessment & Plan Note (Signed)
 She is on losartan  25 mg but experiences significant hypotension in the afternoon with systolic readings around 100 and diastolic in the 30s-40s, while morning blood pressure remains high. Current office reading is 134/72. - Reduce losartan  dose to 12.5 mg daily - Monitor blood pressure at home and report if it continues to drop - Update medication list to reflect new losartan  dose

## 2024-08-16 NOTE — Progress Notes (Unsigned)
 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  Patient also reports that she has had DVT while on Coumadin .  She is referred to clinic to see if there are any other therapeutic options.  She currently feels well and is at her baseline.  She has no neurologic complaints.  She denies any recent fevers or illnesses.  She has a good appetite and denies weight loss.  She has no chest pain, shortness of breath, cough, or hemoptysis.  She denies any nausea, vomiting, constipation, or diarrhea.  She has no urinary complaints.  Patient offers no further specific complaints today.  REVIEW OF SYSTEMS:   Review of Systems  Constitutional: Negative.  Negative for fever, malaise/fatigue and weight loss.  Respiratory: Negative.  Negative for cough, hemoptysis and shortness of breath.   Cardiovascular: Negative.  Negative for chest pain and leg swelling.  Gastrointestinal: Negative.  Negative for abdominal pain.  Genitourinary: Negative.  Negative for dysuria.  Musculoskeletal: Negative.  Negative for back pain.  Skin: Negative.  Negative for rash.  Neurological: Negative.  Negative for dizziness, focal weakness, weakness and headaches.  Psychiatric/Behavioral: Negative.  The patient is not nervous/anxious.     As per HPI. Otherwise, a complete review of systems is negative.  PAST MEDICAL HISTORY: Past Medical History:  Diagnosis Date   ADD (attention deficit disorder)    Allergy    Anxiety    Arthritis 2021   Back pain     Bilateral swelling of feet    Bipolar 1 disorder (HCC)    Bipolar 1 disorder (HCC)    Bipolar disorder (HCC)    Blood transfusion without reported diagnosis 2020   Chewing difficulty    Chronic fatigue syndrome    Chronic kidney disease    Stage 3 kidney disease;dx by Dr. Dallas Gelineau.    Clotting disorder    I'm on a blood  thinner   Constipation    Depression 2000   Diabetes mellitus without complication (HCC)    diet controlled   Dyspnea    with exertion   GERD (gastroesophageal reflux disease)    Headache    migraines   High cholesterol    History of blood clots    History of DVT (deep vein thrombosis)    left leg   Hypertension    states under control with meds., has been on med. x 2 years   Hypothyroidism    Joint pain    Neuromuscular disorder (HCC)    Neuropathy    Obsessive-compulsive disorder    Peripheral vascular disease    Prediabetes    Respiratory failure requiring intubation (HCC)    Restless leg syndrome    Shortness of breath    Sleep apnea    Thrombocytopenia 09/15/2019   Trigger thumb of left hand 01/2018   Trigger thumb of right hand     PAST SURGICAL HISTORY: Past Surgical History:  Procedure Laterality Date   ABDOMINAL HYSTERECTOMY  06/2016   complete   APPLICATION OF WOUND VAC Left 04/27/2019   Procedure: APPLICATION OF WOUND VAC LEFT GROIN;  Surgeon: Sheree Penne Bruckner, MD;  Location: [facility] OR;  Service: Vascular;  Laterality: Left;   AV FISTULA PLACEMENT Left 11/24/2018   Procedure: ARTERIOVENOUS (AV) FISTULA CREATION LEFT SFA TO LEFT FEMORAL VEIN;  Surgeon: Sheree Penne Bruckner, MD;  Location: [facility] OR;  Service: Vascular;  Laterality: Left;   BACK SURGERY     CHOLECYSTECTOMY     COLONOSCOPY WITH PROPOFOL  N/A 11/22/2019   Procedure: COLONOSCOPY WITH PROPOFOL ;  Surgeon: Therisa Bi, MD;  Location: [facility] ENDOSCOPY;  Service: Gastroenterology;  Laterality: N/A;   COLONOSCOPY WITH PROPOFOL  N/A 12/09/2022   Procedure: COLONOSCOPY  WITH PROPOFOL ;  Surgeon: Therisa Bi, MD;  Location: [facility] ENDOSCOPY;  Service: Gastroenterology;  Laterality: N/A;   FEMORAL ARTERY EXPLORATION  03/30/2019   Procedure: Left Common Femoral Artery and Vein Exploration;  Surgeon: Sheree Penne Bruckner, MD;  Location: [facility] OR;  Service: Vascular;;   FEMORAL-FEMORAL BYPASS GRAFT Left 11/24/2018   Procedure: BYPASS GRAFT FEMORAL-FEMORAL VENOUS LEFT TO RIGHT PALMA PROCEDURE USING CRYOVEIN;  Surgeon: Sheree Penne Bruckner, MD;  Location: [facility] OR;  Service: Vascular;  Laterality: Left;   GROIN DEBRIDEMENT Left 04/27/2019   Procedure: QUILLIAN DEBRIDEMENT;  Surgeon: Sheree Penne Bruckner, MD;  Location: [facility] OR;  Service: Vascular;  Laterality: Left;   INSERTION OF ILIAC STENT  03/30/2019   Procedure: Stent of left common, external iliac veins and left common femoral vein;  Surgeon: Sheree Penne Bruckner, MD;  Location: [facility] OR;  Service: Vascular;;   KNEE ARTHROSCOPY WITH MENISCAL REPAIR Left 11/14/2020   Procedure: LEFT KNEE ARTHROSCOPY WITH PARTIAL MEDIAL MENISCECTOMY;  Surgeon: Margrette Taft BRAVO, MD;  Location: [facility] ORS;  Service: Orthopedics;  Laterality: Left;   LOWER EXTREMITY INTERVENTION Left 04/05/2024   Procedure: LOWER EXTREMITY INTERVENTION;  Surgeon: Sheree Penne Bruckner, MD;  Location: [facility] INVASIVE CV LAB;  Service: Cardiovascular;  Laterality: Left;   LOWER EXTREMITY VENOGRAPHY N/A 08/17/2018   Procedure: LOWER EXTREMITY VENOGRAPHY - Central Venogram;  Surgeon: Sheree Penne Bruckner, MD;  Location: [facility] INVASIVE CV LAB;  Service: Cardiovascular;  Laterality: N/A;   LOWER EXTREMITY VENOGRAPHY Bilateral 03/09/2019   Procedure: LOWER EXTREMITY VENOGRAPHY;  Surgeon: Sheree Penne Bruckner, MD;  Location: [facility] INVASIVE CV LAB;  Service: Cardiovascular;  Laterality: Bilateral;   LOWER EXTREMITY VENOGRAPHY Left 08/16/2019   Procedure: LOWER EXTREMITY VENOGRAPHY;  Surgeon: Sheree Penne Bruckner, MD;  Location: [facility] INVASIVE CV LAB;   Service: Cardiovascular;  Laterality: Left;   LOWER EXTREMITY VENOGRAPHY Left 03/25/2022   Procedure: LOWER EXTREMITY VENOGRAPHY;  Surgeon: Sheree Penne Bruckner, MD;  Location: [facility] INVASIVE CV LAB;  Service: Cardiovascular;  Laterality: Left;  IVUS   LOWER EXTREMITY VENOGRAPHY Left 04/05/2024   Procedure: LOWER EXTREMITY VENOGRAPHY;  Surgeon: Sheree Penne Bruckner, MD;  Location: [facility] INVASIVE CV LAB;  Service: Cardiovascular;  Laterality: Left;   LUMBAR FUSION  11/21/2000   L5-S1   LUMBAR SPINE SURGERY     x 2 others   PATCH ANGIOPLASTY Left 03/30/2019   Procedure: Patch Angioplasty of the Left Common Femoral Vein using Venosure Biologic patch;  Surgeon: Sheree Penne Bruckner, MD;  Location: [facility] OR;  Service: Vascular;  Laterality: Left;   PERIPHERAL VASCULAR INTERVENTION Left 08/16/2019   Procedure: PERIPHERAL VASCULAR INTERVENTION;  Surgeon: Sheree Penne Bruckner, MD;  Location: [facility] INVASIVE CV LAB;  Service: Cardiovascular;  Laterality: Left;  common femoral/femoral vein stent   PERIPHERAL VASCULAR INTERVENTION Left 03/25/2022   Procedure: PERIPHERAL VASCULAR INTERVENTION;  Surgeon: Sheree Penne Bruckner, MD;  Location: [facility] INVASIVE CV LAB;  Service: Cardiovascular;  Laterality:  Left;  COMMON FEMORAL VEIN   PERIPHERAL VASCULAR THROMBECTOMY Left 03/25/2022   Procedure: PERIPHERAL VASCULAR THROMBECTOMY;  Surgeon: Sheree Penne Bruckner, MD;  Location: [facility] INVASIVE CV LAB;  Service: Cardiovascular;  Laterality: Left;  EXTREMITY/IVC   PERIPHERAL VASCULAR THROMBECTOMY Left 04/05/2024   Procedure: PERIPHERAL VASCULAR THROMBECTOMY;  Surgeon: Sheree Penne Bruckner, MD;  Location: [facility] INVASIVE CV LAB;  Service: Cardiovascular;  Laterality: Left;   PERIPHERAL VASCULAR ULTRASOUND/IVUS Left 04/05/2024   Procedure: Peripheral Vascular Ultrasound/IVUS;  Surgeon: Sheree Penne Bruckner, MD;  Location: [facility] INVASIVE CV LAB;  Service: Cardiovascular;  Laterality: Left;   SPINE SURGERY      TRIGGER FINGER RELEASE Right 12/01/2017   Procedure: RELEASE TRIGGER FINGER/A-1 PULLEY RIGHT THUMB;  Surgeon: Murrell Drivers, MD;  Location: [facility];  Service: Orthopedics;  Laterality: Right;   TRIGGER FINGER RELEASE Left 01/26/2018   Procedure: LEFT TRIGGER THUMB RELEASE;  Surgeon: Murrell Drivers, MD;  Location: [facility];  Service: Orthopedics;  Laterality: Left;   ULTRASOUND GUIDANCE FOR VASCULAR ACCESS Right 03/30/2019   Procedure: Ultrasound-guided cannulation right internal jugular vein;  Surgeon: Sheree Penne Bruckner, MD;  Location: [facility] OR;  Service: Vascular;  Laterality: Right;   VSD REPAIR      FAMILY HISTORY: Family History  Problem Relation Age of Onset   Heart disease Mother    Hyperlipidemia Mother    Hypertension Mother    Bipolar disorder Mother    Stroke Mother    Depression Mother    Sleep apnea Mother    Obesity Mother    Diabetes Father    Heart disease Father    Hyperlipidemia Father    Hypertension Father    Sleep apnea Father    Obesity Father    Drug abuse Daughter    ADD / ADHD Daughter    Drug abuse Daughter    Anxiety disorder Daughter    Bipolar disorder Daughter    Hypertension Sister    Hypertension Brother    Hyperlipidemia Brother    Heart disease Brother    Early death Brother    Kidney disease Brother    Bipolar disorder Maternal Aunt    Suicidality Maternal Aunt     ADVANCED DIRECTIVES (Y/N):  N  HEALTH MAINTENANCE: Social History   Tobacco Use   Smoking status: Never   Smokeless tobacco: Never  Vaping Use   Vaping status: Never Used  Substance Use Topics   Alcohol use: Never   Drug use: Never     Colonoscopy:  PAP:  Bone density:  Lipid panel:  Allergies  Allergen Reactions   Aripiprazole Other (See Comments)    BECOMES  VIOLENT    Seroquel  [Quetiapine  Fumarate] Other (See Comments)    BECOMES VIOLENT   Chlorpromazine  Other (See Comments)    SEVERE ANXIETY    Gabapentin   Other (See Comments)    NIGHTMARES    Prednisone  Hives    Current Outpatient Medications  Medication Sig Dispense Refill   AJOVY 225 MG/1.5ML SOAJ Inject 1 mL into the skin every 30 (thirty) days.     ALPRAZolam  (XANAX ) 1 MG tablet Take 1 tablet (1 mg total) by mouth 2 (two) times daily as needed. for anxiety 60 tablet 2   amphetamine -dextroamphetamine  (ADDERALL) 30 MG tablet Take 1 tablet by mouth 2 (two) times daily. 60 tablet 0   amphetamine -dextroamphetamine  (ADDERALL) 30 MG tablet Take 1 tablet by mouth 2 (two) times daily. 60 tablet 0   amphetamine -dextroamphetamine  (ADDERALL) 30 MG tablet Take 1  tablet by mouth 2 (two) times daily. 60 tablet 0   aspirin  EC 81 MG tablet Take 81 mg by mouth daily.     atorvastatin  (LIPITOR) 40 MG tablet TAKE 1 TABLET BY MOUTH DAILY 90 tablet 3   cetirizine  (ZYRTEC ) 10 MG tablet TAKE 1 TABLET BY MOUTH DAILY 90 tablet 3   ciprofloxacin  (CIPRO ) 250 MG tablet Take 1 tablet (250 mg total) by mouth 2 (two) times daily. 14 tablet 0   cromolyn (OPTICROM) 4 % ophthalmic solution Place 1 drop into both eyes 2 (two) times daily.     cyclobenzaprine  (FLEXERIL ) 5 MG tablet Take 1 tablet (5 mg total) by mouth 3 (three) times daily as needed for muscle spasms. 90 tablet 2   dabigatran (PRADAXA) 150 MG CAPS capsule Take 1 capsule (150 mg total) by mouth 2 (two) times daily. 60 capsule 0   famotidine  (PEPCID ) 20 MG tablet Take 20 mg by mouth daily.     FLUoxetine  (PROZAC ) 40 MG capsule Take 1 capsule (40 mg total) by mouth 2 (two) times daily. 180 capsule 2   HYDROcodone -acetaminophen  (NORCO) 10-325 MG tablet Take 1 tablet by mouth every 6 (six) hours as needed. To last 30 days from fill date 120 tablet 0   [START ON 08/19/2024] HYDROcodone -acetaminophen  (NORCO) 10-325 MG tablet Take 1 tablet by mouth every 6 (six) hours as needed. To last 30 days from fill date 120 tablet 0   levothyroxine  (SYNTHROID ) 125 MCG tablet TAKE 1 TABLET BY MOUTH DAILY BEFORE BREAKFAST 90  tablet 3   LINZESS  290 MCG CAPS capsule TAKE ONE CAPSULE BY MOUTH DAILY 30 capsule 2   mirabegron  ER (MYRBETRIQ ) 50 MG TB24 tablet Take 1 tablet (50 mg total) by mouth daily. 30 tablet 11   Multiple Vitamin (MULTIVITAMIN) capsule Take 1 capsule by mouth daily.     nitrofurantoin  (MACRODANTIN ) 100 MG capsule Take 1 capsule (100 mg total) by mouth daily. 30 capsule 11   Omega-3 Fatty Acids (OMEGA 3 PO) Take 1 capsule by mouth daily.     pantoprazole  (PROTONIX ) 40 MG tablet TAKE 1 TABLET BY MOUTH DAILY 90 tablet 3   pimecrolimus  (ELIDEL ) 1 % cream Apply to affected areas rash in body folds and legs once to twice daily until improved. 30 g 3   polyethylene glycol powder (GLYCOLAX /MIRALAX ) 17 GM/SCOOP powder TAKE 17 Gram BY MOUTH DAILY 850 g 1   pregabalin  (LYRICA ) 100 MG capsule TAKE ONE CAPSULE BY MOUTH FOUR TIMES DAILY 120 capsule 5   RESTASIS  0.05 % ophthalmic emulsion Place 1 drop into both eyes 2 (two) times daily.     rOPINIRole  (REQUIP ) 2 MG tablet TAKE 1 TABLET BY MOUTH TWICE DAILY 180 tablet 1   Semaglutide , 1 MG/DOSE, 4 MG/3ML SOPN Inject 1 mg as directed once a week. 9 mL 0   solifenacin  (VESICARE ) 5 MG tablet Take 1 tablet (5 mg total) by mouth daily. 30 tablet 11   traZODone  (DESYREL ) 150 MG tablet TAKE 1 TABLET BY MOUTH AT BEDTIME 30 tablet 2   triamcinolone  ointment (KENALOG ) 0.5 % Apply 1 Application topically 2 (two) times daily. 30 g 0   UBRELVY  100 MG TABS Take 100 mg by mouth daily as needed (migraines). May take a second 100 mg dose 2 hours later as needed for migraines     warfarin (COUMADIN ) 1 MG tablet Take one  along with 5 mg pill daily except for Mondays take 2 pills with 5 mg pill.  Equal to 6 mg daily  except 7 mg on Monday. 90 tablet 1   warfarin (COUMADIN ) 5 MG tablet Take 7.5 mg daily except 5mg  on Saturday and Sunday. (Patient taking differently: Take 5 mg by mouth daily. Take with 1 mg to equal 6 mg daily) 45 tablet 11   losartan  (COZAAR ) 25 MG tablet Take 0.5 tablets  (12.5 mg total) by mouth daily.     potassium chloride  (KLOR-CON  M) 10 MEQ tablet Take 1 tablet (10 mEq total) by mouth 3 (three) times daily. 270 tablet 3   No current facility-administered medications for this visit.    OBJECTIVE: Vitals:   08/16/24 1125  BP: (!) 144/63  Pulse: (!) 58  Resp: 18  Temp: (!) 97.3 F (36.3 C)  SpO2: 98%     Body mass index is 34.58 kg/m.    ECOG FS:0 - Asymptomatic  General: Well-developed, well-nourished, no acute distress. Eyes: Pink conjunctiva, anicteric sclera. HEENT: Normocephalic, moist mucous membranes. Lungs: No audible wheezing or coughing. Heart: Regular rate and rhythm. Abdomen: Soft, nontender, no obvious distention. Musculoskeletal: No edema, cyanosis, or clubbing. Neuro: Alert, answering all questions appropriately. Cranial nerves grossly intact. Skin: No rashes or petechiae noted. Psych: Normal affect. Lymphatics: No cervical, calvicular, axillary or inguinal LAD.   LAB RESULTS:  Lab Results  Component Value Date   NA 140 08/16/2024   K 4.0 08/16/2024   CL 101 08/16/2024   CO2 28 08/16/2024   GLUCOSE 104 (H) 08/16/2024   BUN 20 08/16/2024   CREATININE 1.24 (H) 08/16/2024   CALCIUM  9.4 08/16/2024   PROT 7.0 06/01/2024   ALBUMIN  4.4 06/01/2024   AST 19 06/01/2024   ALT 19 06/01/2024   ALKPHOS 95 06/01/2024   BILITOT 0.4 06/01/2024   GFRNONAA 43 (L) 04/06/2024   GFRAA 53 (L) 07/18/2020    Lab Results  Component Value Date   WBC 5.8 08/16/2024   NEUTROABS 3.8 08/16/2024   HGB 12.9 08/16/2024   HCT 37.9 08/16/2024   MCV 94 08/16/2024   PLT 115 (L) 08/16/2024     STUDIES: No results found.  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  She reports she has had multiple DVTs on Coumadin  and has had a difficult time maintaining a therapeutic INR.  We  discussed the possibility of Lovenox , but patient is not terribly interested in daily or twice daily injections lifelong.  She has agreed to Pradaxa 150 mg twice per day.  Her current INR is 2.4.  She has been instructed to discontinue Coumadin  and initiate Pradaxa in 2 days.  Patient was given a prescription today.  All refills can be given by primary care.  No further follow-up has been scheduled.  Please refer patient back to for any questions or concerns Thrombocytopenia: Mild, monitor.  Patient's platelet count is 115.  Okay to continue anticoagulation as long as platelet count remains above 50,000. Renal insufficiency: Mild, monitor.  Patient's GFR is 53.  Okay to use Pradaxa, but this should be discontinued and severe kidney dysfunction.  I spent a total of 45 minutes reviewing chart data, face-to-face evaluation with the patient, counseling and coordination of care as detailed above.  Patient expressed understanding and was in agreement with this plan. She also understands that She can call clinic at any time with any questions, concerns, or complaints.    Evalene JINNY Reusing, MD  08/17/2024 6:35 AM

## 2024-08-16 NOTE — Assessment & Plan Note (Signed)
 She has overactive bladder and a UTI. She prefers PTNS over invasive treatments like Botox injections. - Proceed with PTNS treatment for overactive bladder - Start antibiotic therapy for UTI

## 2024-08-16 NOTE — Progress Notes (Signed)
 08/16/2024 8:37 AM   Jane [name] 1966/06/21 [id number]  Referring provider: Myrla Jon HERO, MD [address] [place],  [place] [address]  No chief complaint on file.   HPI: Br: Chronic renal insufficiency overactive bladder and urinary tract infections.  Was on Gemtesa  and vaginal estrogen cream.  Cystoscopy November 2022.  Has had improvement on Myrbetriq  in the past.  Recently saw a nurse practitioner on Gemtesa .  Having ongoing incontinence.  In May 2024 patient had a UTI with a positive culture.  He is on CPAP.  She was doing well on the Gemtesa  but the effect may have worn off.  Has had a normal CT scan.  Was worked up for microscopic hematuria in the past.  Was started on trimethoprim  hoping it would down regulate her incontinence.  When she saw a nurse practitioner in June she had worsening incontinence and a urinary tract infection and the culture was positive   Today Patient thinks she may have a bladder infection now with burning.  She said when she gets infected she has frequency urge incontinence and worsening nocturia.  She says when she is not infected she can void every 2 hours and get up once a night and that she is continent.  She repeated that when she is not infected she does not think she leaks.   Never filled trimethoprim  due to lack of approval.  I do not think she could afford the Gemtesa  but she said it works very well when she was not infected.  Using estrogen twice a week.   Still has right sided flank discomfort that comes and goes been discussed before  Patient understands that we want to control UTIs first.  Reassess in 3 months and daily Macrodantin  100 mg 30 x 11.  Flank discomfort not due to kidneys.  Reassess incontinence at that time.    Last culture positive.  No infections.  She is continent.  Very pleased.  Macrodantin  90 x 3 sent to pharmacy and see in 1 year.  In 1 year she might want to try to stop it and we discussed this.  Proceed  accordingly   Patient saw physician extender in May 2025 and was put on Myrbetriq .  She had side effects from oxybutynin  and it failed Gemtesa  due to cost.  No urine culture recently.  She was cleared in 2022 for having microscopic hematuria and it had cystoscopy and a CT scan with contrast in 2022 and also a CT stone protocol in 2024.  She also had a CT scan with contrast in 2023   Currently she is infection free on daily Macrodantin  and does not report UTIs.  Myrbetriq  failed.  She has urge incontinence.  She leaks with coughing sneezing.  She wears 2-3 pads during the day moderately wet.  She wears 5 pads a day for high-volume bedwetting.  She voids every hour cannot hold it for 2 hours.  She gets up 5-6 times a night and does have some ankle edema.  She does not take a diuretic  Patient has mixed incontinence and high-volume urge incontinence and bedwetting.  She has urinary frequency.  She has been cleared for microscopic hematuria.  She will return with urodynamics.  In the meantime I offered Vesicare .  Even though she is a non-smoker when she comes back after her urodynamics I am going to repeat her pelvic examination and repeat cystoscopy.  I called in Vesicare  5 mg 30 x 11.  We will then  discuss refractory therapies pending results.  She did say that her incontinence had worsened a lot in the last few months  Today Frequency stable.  Incontinence stable.  Last culture negative. By history I believe the patient is still on Macrodantin .  She is on Vesicare  with mild benefit but still leaking a lot. On pelvic examination she had mild hypermobility the bladder neck and negative cough test before and after cystoscopy with a moderate cough Patient underwent flexible cystoscopy.  She had very cloudy urine.  A lot of white flecks.  Mild bladder erythema.  I carefully looked throughout the entire bladder and no carcinoma.  She has never smoked.  Trigone normal. In retrospect patient thinks he is going  more frequently with a little bit of burning. Urinalysis today looked infected. Patient did not have urodynamics because insurance did not cover it     PMH: Past Medical History:  Diagnosis Date   ADD (attention deficit disorder)    Allergy    Anxiety    Arthritis 2021   Back pain    Bilateral swelling of feet    Bipolar 1 disorder (HCC)    Bipolar 1 disorder (HCC)    Bipolar disorder (HCC)    Blood transfusion without reported diagnosis 2020   Chewing difficulty    Chronic fatigue syndrome    Chronic kidney disease    Stage 3 kidney disease;dx by Dr. Dallas Gelineau.    Clotting disorder    I'm on a blood  thinner   Constipation    Depression 2000   Diabetes mellitus without complication (HCC)    diet controlled   Dyspnea    with exertion   GERD (gastroesophageal reflux disease)    Headache    migraines   High cholesterol    History of blood clots    History of DVT (deep vein thrombosis)    left leg   Hypertension    states under control with meds., has been on med. x 2 years   Hypothyroidism    Joint pain    Neuromuscular disorder (HCC)    Neuropathy    Obsessive-compulsive disorder    Peripheral vascular disease    Prediabetes    Respiratory failure requiring intubation (HCC)    Restless leg syndrome    Shortness of breath    Sleep apnea    Thrombocytopenia 09/15/2019   Trigger thumb of left hand 01/2018   Trigger thumb of right hand     Surgical History: Past Surgical History:  Procedure Laterality Date   ABDOMINAL HYSTERECTOMY  06/2016   complete   APPLICATION OF WOUND VAC Left 04/27/2019   Procedure: APPLICATION OF WOUND VAC LEFT GROIN;  Surgeon: Sheree Penne Bruckner, MD;  Location: [facility] OR;  Service: Vascular;  Laterality: Left;   AV FISTULA PLACEMENT Left 11/24/2018   Procedure: ARTERIOVENOUS (AV) FISTULA CREATION LEFT SFA TO LEFT FEMORAL VEIN;  Surgeon: Sheree Penne Bruckner, MD;  Location: [facility] OR;  Service: Vascular;  Laterality: Left;    BACK SURGERY     CHOLECYSTECTOMY     COLONOSCOPY WITH PROPOFOL  N/A 11/22/2019   Procedure: COLONOSCOPY WITH PROPOFOL ;  Surgeon: Therisa Bi, MD;  Location: [facility] ENDOSCOPY;  Service: Gastroenterology;  Laterality: N/A;   COLONOSCOPY WITH PROPOFOL  N/A 12/09/2022   Procedure: COLONOSCOPY WITH PROPOFOL ;  Surgeon: Therisa Bi, MD;  Location: [facility] ENDOSCOPY;  Service: Gastroenterology;  Laterality: N/A;   FEMORAL ARTERY EXPLORATION  03/30/2019   Procedure: Left Common Femoral Artery and Vein Exploration;  Surgeon: Sheree,  Penne Bruckner, MD;  Location: [facility] OR;  Service: Vascular;;   FEMORAL-FEMORAL BYPASS GRAFT Left 11/24/2018   Procedure: BYPASS GRAFT FEMORAL-FEMORAL VENOUS LEFT TO RIGHT PALMA PROCEDURE USING CRYOVEIN;  Surgeon: Sheree Penne Bruckner, MD;  Location: [facility] OR;  Service: Vascular;  Laterality: Left;   GROIN DEBRIDEMENT Left 04/27/2019   Procedure: GROIN DEBRIDEMENT;  Surgeon: Sheree Penne Bruckner, MD;  Location: [facility] OR;  Service: Vascular;  Laterality: Left;   INSERTION OF ILIAC STENT  03/30/2019   Procedure: Stent of left common, external iliac veins and left common femoral vein;  Surgeon: Sheree Penne Bruckner, MD;  Location: [facility] OR;  Service: Vascular;;   KNEE ARTHROSCOPY WITH MENISCAL REPAIR Left 11/14/2020   Procedure: LEFT KNEE ARTHROSCOPY WITH PARTIAL MEDIAL MENISCECTOMY;  Surgeon: Margrette Taft BRAVO, MD;  Location: [facility] ORS;  Service: Orthopedics;  Laterality: Left;   LOWER EXTREMITY INTERVENTION Left 04/05/2024   Procedure: LOWER EXTREMITY INTERVENTION;  Surgeon: Sheree Penne Bruckner, MD;  Location: [facility] INVASIVE CV LAB;  Service: Cardiovascular;  Laterality: Left;   LOWER EXTREMITY VENOGRAPHY N/A 08/17/2018   Procedure: LOWER EXTREMITY VENOGRAPHY - Central Venogram;  Surgeon: Sheree Penne Bruckner, MD;  Location: [facility] INVASIVE CV LAB;  Service: Cardiovascular;  Laterality: N/A;   LOWER EXTREMITY VENOGRAPHY Bilateral 03/09/2019   Procedure: LOWER EXTREMITY  VENOGRAPHY;  Surgeon: Sheree Penne Bruckner, MD;  Location: [facility] INVASIVE CV LAB;  Service: Cardiovascular;  Laterality: Bilateral;   LOWER EXTREMITY VENOGRAPHY Left 08/16/2019   Procedure: LOWER EXTREMITY VENOGRAPHY;  Surgeon: Sheree Penne Bruckner, MD;  Location: [facility] INVASIVE CV LAB;  Service: Cardiovascular;  Laterality: Left;   LOWER EXTREMITY VENOGRAPHY Left 03/25/2022   Procedure: LOWER EXTREMITY VENOGRAPHY;  Surgeon: Sheree Penne Bruckner, MD;  Location: [facility] INVASIVE CV LAB;  Service: Cardiovascular;  Laterality: Left;  IVUS   LOWER EXTREMITY VENOGRAPHY Left 04/05/2024   Procedure: LOWER EXTREMITY VENOGRAPHY;  Surgeon: Sheree Penne Bruckner, MD;  Location: [facility] INVASIVE CV LAB;  Service: Cardiovascular;  Laterality: Left;   LUMBAR FUSION  11/21/2000   L5-S1   LUMBAR SPINE SURGERY     x 2 others   PATCH ANGIOPLASTY Left 03/30/2019   Procedure: Patch Angioplasty of the Left Common Femoral Vein using Venosure Biologic patch;  Surgeon: Sheree Penne Bruckner, MD;  Location: [facility] OR;  Service: Vascular;  Laterality: Left;   PERIPHERAL VASCULAR INTERVENTION Left 08/16/2019   Procedure: PERIPHERAL VASCULAR INTERVENTION;  Surgeon: Sheree Penne Bruckner, MD;  Location: [facility] INVASIVE CV LAB;  Service: Cardiovascular;  Laterality: Left;  common femoral/femoral vein stent   PERIPHERAL VASCULAR INTERVENTION Left 03/25/2022   Procedure: PERIPHERAL VASCULAR INTERVENTION;  Surgeon: Sheree Penne Bruckner, MD;  Location: [facility] INVASIVE CV LAB;  Service: Cardiovascular;  Laterality: Left;  COMMON FEMORAL VEIN   PERIPHERAL VASCULAR THROMBECTOMY Left 03/25/2022   Procedure: PERIPHERAL VASCULAR THROMBECTOMY;  Surgeon: Sheree Penne Bruckner, MD;  Location: [facility] INVASIVE CV LAB;  Service: Cardiovascular;  Laterality: Left;  EXTREMITY/IVC   PERIPHERAL VASCULAR THROMBECTOMY Left 04/05/2024   Procedure: PERIPHERAL VASCULAR THROMBECTOMY;  Surgeon: Sheree Penne Bruckner, MD;  Location: [facility] INVASIVE  CV LAB;  Service: Cardiovascular;  Laterality: Left;   PERIPHERAL VASCULAR ULTRASOUND/IVUS Left 04/05/2024   Procedure: Peripheral Vascular Ultrasound/IVUS;  Surgeon: Sheree Penne Bruckner, MD;  Location: [facility] INVASIVE CV LAB;  Service: Cardiovascular;  Laterality: Left;   SPINE SURGERY     TRIGGER FINGER RELEASE Right 12/01/2017   Procedure: RELEASE TRIGGER FINGER/A-1 PULLEY RIGHT THUMB;  Surgeon: Murrell Drivers, MD;  Location: [facility];  Service: Orthopedics;  Laterality:  Right;   TRIGGER FINGER RELEASE Left 01/26/2018   Procedure: LEFT TRIGGER THUMB RELEASE;  Surgeon: Murrell Drivers, MD;  Location: [facility];  Service: Orthopedics;  Laterality: Left;   ULTRASOUND GUIDANCE FOR VASCULAR ACCESS Right 03/30/2019   Procedure: Ultrasound-guided cannulation right internal jugular vein;  Surgeon: Sheree Penne Bruckner, MD;  Location: [facility] OR;  Service: Vascular;  Laterality: Right;   VSD REPAIR      Home Medications:  Allergies as of 08/16/2024       Reactions   Aripiprazole Other (See Comments)   BECOMES  VIOLENT   Seroquel  [quetiapine  Fumarate] Other (See Comments)   BECOMES VIOLENT   Chlorpromazine  Other (See Comments)   SEVERE ANXIETY   Gabapentin  Other (See Comments)   NIGHTMARES   Prednisone  Hives        Medication List        Accurate as of August 16, 2024  8:37 AM. If you have any questions, ask your nurse or doctor.          Ajovy 225 MG/1.5ML Soaj Generic drug: Fremanezumab-vfrm Inject 1 mL into the skin every 30 (thirty) days.   ALPRAZolam  1 MG tablet Commonly known as: XANAX  Take 1 tablet (1 mg total) by mouth 2 (two) times daily as needed. for anxiety   amphetamine -dextroamphetamine  30 MG tablet Commonly known as: Adderall Take 1 tablet by mouth 2 (two) times daily.   amphetamine -dextroamphetamine  30 MG tablet Commonly known as: Adderall Take 1 tablet by mouth 2 (two) times daily.   amphetamine -dextroamphetamine  30 MG  tablet Commonly known as: ADDERALL Take 1 tablet by mouth 2 (two) times daily.   aspirin  EC 81 MG tablet Take 81 mg by mouth daily.   atorvastatin  40 MG tablet Commonly known as: LIPITOR TAKE 1 TABLET BY MOUTH DAILY   cetirizine  10 MG tablet Commonly known as: ZYRTEC  TAKE 1 TABLET BY MOUTH DAILY   cromolyn 4 % ophthalmic solution Commonly known as: OPTICROM Place 1 drop into both eyes 2 (two) times daily.   cyclobenzaprine  5 MG tablet Commonly known as: FLEXERIL  Take 1 tablet (5 mg total) by mouth 3 (three) times daily as needed for muscle spasms.   famotidine  20 MG tablet Commonly known as: PEPCID  Take 20 mg by mouth daily.   FLUoxetine  40 MG capsule Commonly known as: PROZAC  Take 1 capsule (40 mg total) by mouth 2 (two) times daily.   HYDROcodone -acetaminophen  10-325 MG tablet Commonly known as: NORCO Take 1 tablet by mouth every 6 (six) hours as needed. To last 30 days from fill date   HYDROcodone -acetaminophen  10-325 MG tablet Commonly known as: NORCO Take 1 tablet by mouth every 6 (six) hours as needed. To last 30 days from fill date Start taking on: August 19, 2024   levothyroxine  125 MCG tablet Commonly known as: SYNTHROID  TAKE 1 TABLET BY MOUTH DAILY BEFORE BREAKFAST   Linzess  290 MCG Caps capsule Generic drug: linaclotide  TAKE ONE CAPSULE BY MOUTH DAILY   losartan  25 MG tablet Commonly known as: COZAAR  Take 1 tablet (25 mg total) by mouth daily.   mirabegron  ER 50 MG Tb24 tablet Commonly known as: MYRBETRIQ  Take 1 tablet (50 mg total) by mouth daily.   multivitamin capsule Take 1 capsule by mouth daily.   nitrofurantoin  100 MG capsule Commonly known as: Macrodantin  Take 1 capsule (100 mg total) by mouth daily.   OMEGA 3 PO Take 1 capsule by mouth daily.   pantoprazole  40 MG tablet Commonly known as: PROTONIX  TAKE 1 TABLET  BY MOUTH DAILY   pimecrolimus  1 % cream Commonly known as: ELIDEL  Apply to affected areas rash in body folds and  legs once to twice daily until improved.   polyethylene glycol powder 17 GM/SCOOP powder Commonly known as: GLYCOLAX /MIRALAX  TAKE 17 Gram BY MOUTH DAILY   potassium chloride  10 MEQ tablet Commonly known as: KLOR-CON  M Take 1 tablet (10 mEq total) by mouth 3 (three) times daily.   pregabalin  100 MG capsule Commonly known as: LYRICA  TAKE ONE CAPSULE BY MOUTH FOUR TIMES DAILY   Restasis  0.05 % ophthalmic emulsion Generic drug: cycloSPORINE  Place 1 drop into both eyes 2 (two) times daily.   rOPINIRole  2 MG tablet Commonly known as: REQUIP  TAKE 1 TABLET BY MOUTH TWICE DAILY   Semaglutide  (1 MG/DOSE) 4 MG/3ML Sopn Inject 1 mg as directed once a week.   solifenacin  5 MG tablet Commonly known as: VESICARE  Take 1 tablet (5 mg total) by mouth daily.   traZODone  150 MG tablet Commonly known as: DESYREL  TAKE 1 TABLET BY MOUTH AT BEDTIME   triamcinolone  ointment 0.5 % Commonly known as: KENALOG  Apply 1 Application topically 2 (two) times daily.   Ubrelvy  100 MG Tabs Generic drug: Ubrogepant  Take 100 mg by mouth daily as needed (migraines). May take a second 100 mg dose 2 hours later as needed for migraines   warfarin 5 MG tablet Commonly known as: COUMADIN  Take as directed by the anticoagulation clinic. If you are unsure how to take this medication, talk to your nurse or doctor. Original instructions: Take 7.5 mg daily except 5mg  on Saturday and Sunday. What changed:  how much to take how to take this when to take this additional instructions   warfarin 1 MG tablet Commonly known as: COUMADIN  Take as directed by the anticoagulation clinic. If you are unsure how to take this medication, talk to your nurse or doctor. Original instructions: Take one  along with 5 mg pill daily except for Mondays take 2 pills with 5 mg pill.  Equal to 6 mg daily except 7 mg on Monday. What changed: Another medication with the same name was changed. Make sure you understand how and when to take  each.        Allergies:  Allergies  Allergen Reactions   Aripiprazole Other (See Comments)    BECOMES  VIOLENT    Seroquel  [Quetiapine  Fumarate] Other (See Comments)    BECOMES VIOLENT   Chlorpromazine  Other (See Comments)    SEVERE ANXIETY    Gabapentin  Other (See Comments)    NIGHTMARES    Prednisone  Hives    Family History: Family History  Problem Relation Age of Onset   Heart disease Mother    Hyperlipidemia Mother    Hypertension Mother    Bipolar disorder Mother    Stroke Mother    Depression Mother    Sleep apnea Mother    Obesity Mother    Diabetes Father    Heart disease Father    Hyperlipidemia Father    Hypertension Father    Sleep apnea Father    Obesity Father    Drug abuse Daughter    ADD / ADHD Daughter    Drug abuse Daughter    Anxiety disorder Daughter    Bipolar disorder Daughter    Hypertension Sister    Hypertension Brother    Hyperlipidemia Brother    Heart disease Brother    Early death Brother    Kidney disease Brother    Bipolar disorder Maternal Aunt  Suicidality Maternal Aunt     Social History:  reports that she has never smoked. She has never used smokeless tobacco. She reports that she does not drink alcohol and does not use drugs.  ROS:                                        Physical Exam: LMP  (LMP Unknown)   Constitutional:  Alert and oriented, No acute distress. HEENT: [place] AT, moist mucus membranes.  Trachea midline, no masses.   Laboratory Data: Lab Results  Component Value Date   WBC 5.3 04/05/2024   HGB 11.8 (L) 04/05/2024   HCT 37.3 04/05/2024   MCV 95.2 04/05/2024   PLT 98 (L) 04/05/2024    Lab Results  Component Value Date   CREATININE 1.20 (H) 06/01/2024    No results found for: PSA  No results found for: TESTOSTERONE  Lab Results  Component Value Date   HGBA1C 5.7 (A) 06/01/2024    Urinalysis    Component Value Date/Time   COLORURINE YELLOW (A) 01/30/2023  1328   APPEARANCEUR Clear 05/31/2024 0823   LABSPEC 1.010 01/30/2023 1328   PHURINE 7.0 01/30/2023 1328   GLUCOSEU Negative 05/31/2024 0823   HGBUR SMALL (A) 01/30/2023 1328   BILIRUBINUR Negative 05/31/2024 0823   KETONESUR NEGATIVE 01/30/2023 1328   PROTEINUR Negative 05/31/2024 0823   PROTEINUR NEGATIVE 01/30/2023 1328   UROBILINOGEN 0.2 10/06/2023 1418   NITRITE Negative 05/31/2024 0823   NITRITE NEGATIVE 01/30/2023 1328   LEUKOCYTESUR Negative 05/31/2024 0823   LEUKOCYTESUR NEGATIVE 01/30/2023 1328    Pertinent Imaging: Urine positive and sent for culture  Assessment & Plan: Patient has mixed incontinence but high-volume urge incontinence and bedwetting.  She did not have urodynamics but clinically she primarily has refractory overactive bladder.  I discussed refractory treatments with her.  I thought it was best to give her ciprofloxacin  2 and 50 mg twice a day and go back on daily Macrodantin  and call if the culture is positive.  If she gets another breakthrough infection I will switch her prophylaxis.  I am not sure why her insurance would not cover trimethoprim  in the past.  Patient no longer on CPAP.  She does take blood thinners.  She would like to do percutaneous tibial nerve stimulation.  She may or may not consider Botox or InterStim in the future.  Full templates used.  Handout given.  Proceed accordingly  1. Mixed incontinence (Primary)  - Urinalysis, Complete   No follow-ups on file.  Glendia DELENA Elizabeth, MD  [facility] [address], [address] [place], [place] [address] [phone]

## 2024-08-16 NOTE — Progress Notes (Signed)
 Established patient visit   Patient: Jane [name]   DOB: 1965-11-12   58 y.o. Female  MRN: [id number] Visit Date: 08/16/2024  Today's healthcare provider: Jon Eva, MD   Chief Complaint  Patient presents with   Medical Management of Chronic Issues   Hypertension    Patient reports she monitors at home. States its been high but dropping lower than usual in the afternoon. She is consuming a low sodium diet, she is exercising. She reports no new symptoms just her normal lower leg edema   Coagulation Disorder    Last checked 07/14/24 with INR of 3.9%. She was advised to hold for 2 days then resume to 5 mg daily except 6 mg M-W-F return to clinic in 2-3 weeks.   Otitis Media   Subjective    Hypertension   HPI     Hypertension    Additional comments: Patient reports she monitors at home. States its been high but dropping lower than usual in the afternoon. She is consuming a low sodium diet, she is exercising. She reports no new symptoms just her normal lower leg edema        Coagulation Disorder    Additional comments: Last checked 07/14/24 with INR of 3.9%. She was advised to hold for 2 days then resume to 5 mg daily except 6 mg M-W-F return to clinic in 2-3 weeks.      Last edited by Lilian Fitzpatrick, CMA on 08/16/2024  1:40 PM.       Discussed the use of AI scribe software for clinical note transcription with the patient, who gave verbal consent to proceed.  History of Present Illness   Jane [name] is a 58 year old female who presents for medication management and follow-up.  She is on Coumadin  for anticoagulation with an INR of 2.4. She has a history of DVT and previously did not tolerate Xarelto  and other anticoagulants well. She is on antibiotics for a UTI, which interacts with Coumadin .  She takes losartan  25 mg and experiences significant blood pressure drops in the afternoon, with readings as low as 100/30-40 mmHg, while morning readings are  higher. She is more active in the morning.  She is taking potassium three times a day but has run out of her supply due to pharmacy refill issues.  She is scheduled for PTNS for overactive bladder. She is considering hammer toe surgery and plans to see a knee surgeon. She reports vision changes but cannot have an eye exam until next year due to Medicaid restrictions. She is prediabetic.         Medications: Outpatient Medications Prior to Visit  Medication Sig   AJOVY 225 MG/1.5ML SOAJ Inject 1 mL into the skin every 30 (thirty) days.   ALPRAZolam  (XANAX ) 1 MG tablet Take 1 tablet (1 mg total) by mouth 2 (two) times daily as needed. for anxiety   amphetamine -dextroamphetamine  (ADDERALL) 30 MG tablet Take 1 tablet by mouth 2 (two) times daily.   amphetamine -dextroamphetamine  (ADDERALL) 30 MG tablet Take 1 tablet by mouth 2 (two) times daily.   amphetamine -dextroamphetamine  (ADDERALL) 30 MG tablet Take 1 tablet by mouth 2 (two) times daily.   aspirin  EC 81 MG tablet Take 81 mg by mouth daily.   atorvastatin  (LIPITOR) 40 MG tablet TAKE 1 TABLET BY MOUTH DAILY   cetirizine  (ZYRTEC ) 10 MG tablet TAKE 1 TABLET BY MOUTH DAILY   ciprofloxacin  (CIPRO ) 250 MG tablet Take 1 tablet (250 mg total) by mouth 2 (  two) times daily.   cromolyn (OPTICROM) 4 % ophthalmic solution Place 1 drop into both eyes 2 (two) times daily.   cyclobenzaprine  (FLEXERIL ) 5 MG tablet Take 1 tablet (5 mg total) by mouth 3 (three) times daily as needed for muscle spasms.   dabigatran (PRADAXA) 150 MG CAPS capsule Take 1 capsule (150 mg total) by mouth 2 (two) times daily.   famotidine  (PEPCID ) 20 MG tablet Take 20 mg by mouth daily.   FLUoxetine  (PROZAC ) 40 MG capsule Take 1 capsule (40 mg total) by mouth 2 (two) times daily.   HYDROcodone -acetaminophen  (NORCO) 10-325 MG tablet Take 1 tablet by mouth every 6 (six) hours as needed. To last 30 days from fill date   [START ON 08/19/2024] HYDROcodone -acetaminophen  (NORCO) 10-325  MG tablet Take 1 tablet by mouth every 6 (six) hours as needed. To last 30 days from fill date   levothyroxine  (SYNTHROID ) 125 MCG tablet TAKE 1 TABLET BY MOUTH DAILY BEFORE BREAKFAST   LINZESS  290 MCG CAPS capsule TAKE ONE CAPSULE BY MOUTH DAILY   mirabegron  ER (MYRBETRIQ ) 50 MG TB24 tablet Take 1 tablet (50 mg total) by mouth daily.   Multiple Vitamin (MULTIVITAMIN) capsule Take 1 capsule by mouth daily.   nitrofurantoin  (MACRODANTIN ) 100 MG capsule Take 1 capsule (100 mg total) by mouth daily.   Omega-3 Fatty Acids (OMEGA 3 PO) Take 1 capsule by mouth daily.   pantoprazole  (PROTONIX ) 40 MG tablet TAKE 1 TABLET BY MOUTH DAILY   pimecrolimus  (ELIDEL ) 1 % cream Apply to affected areas rash in body folds and legs once to twice daily until improved.   polyethylene glycol powder (GLYCOLAX /MIRALAX ) 17 GM/SCOOP powder TAKE 17 Gram BY MOUTH DAILY   pregabalin  (LYRICA ) 100 MG capsule TAKE ONE CAPSULE BY MOUTH FOUR TIMES DAILY   RESTASIS  0.05 % ophthalmic emulsion Place 1 drop into both eyes 2 (two) times daily.   rOPINIRole  (REQUIP ) 2 MG tablet TAKE 1 TABLET BY MOUTH TWICE DAILY   Semaglutide , 1 MG/DOSE, 4 MG/3ML SOPN Inject 1 mg as directed once a week.   solifenacin  (VESICARE ) 5 MG tablet Take 1 tablet (5 mg total) by mouth daily.   traZODone  (DESYREL ) 150 MG tablet TAKE 1 TABLET BY MOUTH AT BEDTIME   triamcinolone  ointment (KENALOG ) 0.5 % Apply 1 Application topically 2 (two) times daily.   UBRELVY  100 MG TABS Take 100 mg by mouth daily as needed (migraines). May take a second 100 mg dose 2 hours later as needed for migraines   warfarin (COUMADIN ) 1 MG tablet Take one  along with 5 mg pill daily except for Mondays take 2 pills with 5 mg pill.  Equal to 6 mg daily except 7 mg on Monday.   warfarin (COUMADIN ) 5 MG tablet Take 7.5 mg daily except 5mg  on Saturday and Sunday. (Patient taking differently: Take 5 mg by mouth daily. Take with 1 mg to equal 6 mg daily)   [DISCONTINUED] losartan  (COZAAR ) 25 MG  tablet Take 1 tablet (25 mg total) by mouth daily.   [DISCONTINUED] potassium chloride  (KLOR-CON  M) 10 MEQ tablet Take 1 tablet (10 mEq total) by mouth 3 (three) times daily.   No facility-administered medications prior to visit.    Review of Systems     Objective    BP 134/72 (BP Location: Left Arm, Patient Position: Sitting, Cuff Size: Normal)   Pulse (!) 58   Ht 5' 10 (1.778 m)   Wt 242 lb 3.2 oz (109.9 kg)   LMP  (LMP Unknown)  SpO2 98%   BMI 34.75 kg/m    Physical Exam Vitals reviewed.  Constitutional:      General: She is not in acute distress.    Appearance: Normal appearance. She is well-developed. She is not diaphoretic.  HENT:     Head: Normocephalic and atraumatic.  Eyes:     General: No scleral icterus.    Conjunctiva/sclera: Conjunctivae normal.  Neck:     Thyroid : No thyromegaly.  Cardiovascular:     Rate and Rhythm: Normal rate and regular rhythm.     Heart sounds: Normal heart sounds. No murmur heard. Pulmonary:     Effort: Pulmonary effort is normal. No respiratory distress.     Breath sounds: Normal breath sounds. No wheezing, rhonchi or rales.  Musculoskeletal:     Cervical back: Neck supple.     Right lower leg: No edema.     Left lower leg: No edema.  Lymphadenopathy:     Cervical: No cervical adenopathy.  Skin:    General: Skin is warm and dry.     Findings: No rash.  Neurological:     Mental Status: She is alert and oriented to person, place, and time. Mental status is at baseline.  Psychiatric:        Mood and Affect: Mood normal.        Behavior: Behavior normal.      Results for orders placed or performed in visit on 08/16/24  POCT INR  Result Value Ref Range   INR     POC INR 2.4   Results for orders placed or performed in visit on 08/16/24  Microscopic Examination   Urine  Result Value Ref Range   WBC, UA >30 (A) 0 - 5 /hpf   RBC, Urine 11-30 (A) 0 - 2 /hpf   Epithelial Cells (non renal) 0-10 0 - 10 /hpf   Mucus, UA  Present (A) Not Estab.   Bacteria, UA Many (A) None seen/Few  Urinalysis, Complete  Result Value Ref Range   Specific Gravity, UA 1.015 1.005 - 1.030   pH, UA 7.5 5.0 - 7.5   Color, UA Yellow Yellow   Appearance Ur Slightly cloudy Clear   Leukocytes,UA 2+ (A) Negative   Protein,UA Negative Negative/Trace   Glucose, UA Negative Negative   Ketones, UA Negative Negative   RBC, UA 1+ (A) Negative   Bilirubin, UA Negative Negative   Urobilinogen, Ur 0.2 0.2 - 1.0 mg/dL   Nitrite, UA Positive (A) Negative   Microscopic Examination See below:     Assessment & Plan     Problem List Items Addressed This Visit       Cardiovascular and Mediastinum   Hypertension associated with diabetes (HCC) - Primary (Chronic)   She is on losartan  25 mg but experiences significant hypotension in the afternoon with systolic readings around 100 and diastolic in the 30s-40s, while morning blood pressure remains high. Current office reading is 134/72. - Reduce losartan  dose to 12.5 mg daily - Monitor blood pressure at home and report if it continues to drop - Update medication list to reflect new losartan  dose      Relevant Medications   losartan  (COZAAR ) 25 MG tablet   Other Relevant Orders   Basic Metabolic Panel (BMET)     Genitourinary   Overactive bladder   She has overactive bladder and a UTI. She prefers PTNS over invasive treatments like Botox injections. - Proceed with PTNS treatment for overactive bladder - Start antibiotic therapy for UTI  Other   History of DVT (deep vein thrombosis)   Relevant Orders   POCT INR (Completed)   Chronic anticoagulation (Coumadin )   Relevant Orders   POCT INR (Completed)   CBC w/Diff/Platelet   Other Visit Diagnoses       Hypokalemia              Hypokalemia She has been on potassium supplementation three times a day but reports running out of the medication due to pharmacy refill issues. Potassium levels need monitoring, especially  with losartan  initiation. - Send potassium prescription to pharmacy - Check potassium levels today  Anticoagulation management for venous thrombosis and embolism She is on Coumadin  with an INR of 2.4. Consideration for switch to Eliquis by hematologist Dr. Jacobo due to fewer interactions and no INR monitoring requirement. - Send INR results to Dr. Jacobo - Await Dr. Jerone decision on switching to Eliquis - If remaining on Coumadin , recheck INR in one month - Check blood counts due to potential change in anticoagulation therapy        Return for as scheduled.       Jon Eva, MD  [facility] [phone] (phone) [phone] (fax)  [facility]

## 2024-08-16 NOTE — Progress Notes (Unsigned)
 Patient is here about her coumadin .

## 2024-08-17 LAB — CBC WITH DIFFERENTIAL/PLATELET
Basophils Absolute: 0 x10E3/uL (ref 0.0–0.2)
Basos: 0 %
EOS (ABSOLUTE): 0.1 x10E3/uL (ref 0.0–0.4)
Eos: 1 %
Hematocrit: 37.9 % (ref 34.0–46.6)
Hemoglobin: 12.9 g/dL (ref 11.1–15.9)
Immature Grans (Abs): 0 x10E3/uL (ref 0.0–0.1)
Immature Granulocytes: 0 %
Lymphocytes Absolute: 1.5 x10E3/uL (ref 0.7–3.1)
Lymphs: 26 %
MCH: 32 pg (ref 26.6–33.0)
MCHC: 34 g/dL (ref 31.5–35.7)
MCV: 94 fL (ref 79–97)
Monocytes Absolute: 0.4 x10E3/uL (ref 0.1–0.9)
Monocytes: 7 %
Neutrophils Absolute: 3.8 x10E3/uL (ref 1.4–7.0)
Neutrophils: 66 %
Platelets: 115 x10E3/uL — ABNORMAL LOW (ref 150–450)
RBC: 4.03 x10E6/uL (ref 3.77–5.28)
RDW: 13.1 % (ref 11.7–15.4)
WBC: 5.8 x10E3/uL (ref 3.4–10.8)

## 2024-08-17 LAB — BASIC METABOLIC PANEL WITH GFR
BUN/Creatinine Ratio: 16 (ref 9–23)
BUN: 20 mg/dL (ref 6–24)
CO2: 28 mmol/L (ref 20–29)
Calcium: 9.4 mg/dL (ref 8.7–10.2)
Chloride: 101 mmol/L (ref 96–106)
Creatinine, Ser: 1.24 mg/dL — ABNORMAL HIGH (ref 0.57–1.00)
Glucose: 104 mg/dL — ABNORMAL HIGH (ref 70–99)
Potassium: 4 mmol/L (ref 3.5–5.2)
Sodium: 140 mmol/L (ref 134–144)
eGFR: 50 mL/min/1.73 — ABNORMAL LOW (ref >59)

## 2024-08-18 ENCOUNTER — Telehealth (HOSPITAL_COMMUNITY): Payer: MEDICAID | Admitting: Psychiatry

## 2024-08-18 ENCOUNTER — Ambulatory Visit: Payer: MEDICAID | Admitting: Podiatry

## 2024-08-18 ENCOUNTER — Ambulatory Visit: Payer: Self-pay | Admitting: Family Medicine

## 2024-08-19 LAB — CULTURE, URINE COMPREHENSIVE

## 2024-08-23 ENCOUNTER — Encounter (INDEPENDENT_AMBULATORY_CARE_PROVIDER_SITE_OTHER): Payer: Self-pay | Admitting: Family Medicine

## 2024-08-23 ENCOUNTER — Other Ambulatory Visit (HOSPITAL_COMMUNITY): Payer: Self-pay | Admitting: Psychiatry

## 2024-08-23 ENCOUNTER — Ambulatory Visit (INDEPENDENT_AMBULATORY_CARE_PROVIDER_SITE_OTHER): Payer: MEDICAID | Admitting: Family Medicine

## 2024-08-23 ENCOUNTER — Encounter: Payer: Self-pay | Admitting: Family Medicine

## 2024-08-23 VITALS — BP 132/78 | HR 58 | Temp 97.8°F | Ht 68.0 in | Wt 240.0 lb

## 2024-08-23 DIAGNOSIS — Z6836 Body mass index (BMI) 36.0-36.9, adult: Secondary | ICD-10-CM

## 2024-08-23 DIAGNOSIS — E669 Obesity, unspecified: Secondary | ICD-10-CM

## 2024-08-23 DIAGNOSIS — I152 Hypertension secondary to endocrine disorders: Secondary | ICD-10-CM

## 2024-08-23 DIAGNOSIS — Z86718 Personal history of other venous thrombosis and embolism: Secondary | ICD-10-CM

## 2024-08-23 DIAGNOSIS — M1712 Unilateral primary osteoarthritis, left knee: Secondary | ICD-10-CM

## 2024-08-23 DIAGNOSIS — E1159 Type 2 diabetes mellitus with other circulatory complications: Secondary | ICD-10-CM

## 2024-08-23 DIAGNOSIS — Z7985 Long-term (current) use of injectable non-insulin antidiabetic drugs: Secondary | ICD-10-CM

## 2024-08-23 NOTE — Telephone Encounter (Signed)
 Needs acute appt tomorrow with anyone available. Has extensive h/o DVTs and complaining of unilateral leg swelling. If any respiratory symptoms (SOB, etc), needs to be seen urgently

## 2024-08-23 NOTE — Telephone Encounter (Signed)
 Call for appt

## 2024-08-23 NOTE — Progress Notes (Signed)
 SUBJECTIVE:  Chief Complaint: Obesity  Interim History: Patient went to the beach since last appointment.  She mentions she ate the best she could in terms of nutrition when she had a chance.  Her leg has been swelling and that has limited her ability to engage in physical activity like chair aerobics at the senior center.  She was taken off coumadin  and placed on another blood thinner (Pradaxa).  She needs to get her knee replaced and has an appointment with ortho in 4 days.  Patient knew she put on weight when she returned from the beach and has been trying to make substitutions like water and unsweet tea for soda, having salad once a day.  Her former significant other has essentially moved in.  She needs a new meal plan.    Tasfia is here to discuss her progress with her obesity treatment plan. She is on the practicing portion control and making smarter food choices, such as increasing vegetables and decreasing simple carbohydrates and states she is following her eating plan approximately 50 % of the time. She states she is exercising 45 minutes 3 times per week.   OBJECTIVE: Visit Diagnoses: Problem List Items Addressed This Visit       Cardiovascular and Mediastinum   Hypertension associated with diabetes (HCC) (Chronic)     Musculoskeletal and Integument   Arthritis of left knee     Other   History of DVT (deep vein thrombosis) - Primary   Morbid obesity (HCC)   Other Visit Diagnoses       BMI 36.0-36.9,adult           Vitals Temp: 97.8 F (36.6 C) BP: 132/78 Pulse Rate: (!) 58 SpO2: 97 %   Anthropometric Measurements Height: 5' 8 (1.727 m) Weight: 240 lb (108.9 kg) BMI (Calculated): 36.5 Weight at Last Visit: 235 lb Weight Lost Since Last Visit: 0 Weight Gained Since Last Visit: 5 Starting Weight: 283 lb Total Weight Loss (lbs): 43 lb (19.5 kg)   Body Composition  Body Fat %: 50.1 % Fat Mass (lbs): 120.4 lbs Muscle Mass (lbs): 114 lbs Visceral Fat  Rating : 14   Other Clinical Data Fasting: no Labs: no Today's Visit #: 52 Starting Date: 03/29/20 Comments: PC/[facility]     ASSESSMENT AND PLAN: Assessment & Plan History of DVT (deep vein thrombosis) Recent change from Coumadin  to Pradaxa to help manage increased risk of blood clots.  Will follow-up on current treatment after patient follows up with hematology. Hypertension associated with diabetes (HCC) Blood pressure controlled at this appointment.  Patient does report increased pain recently so pending continued monitoring of BP may need to make pharmacotherapy modifications. Arthritis of left knee Patient reports she has upcoming assessment for knee replacement surgery.  Will follow-up on treatment plan at next appointment. Obesity with starting BMI of 43.0  BMI 36.0-36.9,adult    Diet: Sabre is currently in the action stage of change. As such, her goal is to continue with weight loss efforts and has agreed to the [place].   Exercise:  For substantial health benefits, adults should do at least 150 minutes (2 hours and 30 minutes) a week of moderate-intensity, or 75 minutes (1 hour and 15 minutes) a week of vigorous-intensity aerobic physical activity, or an equivalent combination of moderate- and vigorous-intensity aerobic activity. Aerobic activity should be performed in episodes of at least 10 minutes, and preferably, it should be spread throughout the week.  Behavior Modification:  We discussed the following  Behavioral Modification Strategies today: increasing lean protein intake, decreasing simple carbohydrates, meal planning and cooking strategies, and keeping healthy foods in the home.   Return in about 6 weeks (around 10/04/2024).   She was informed of the importance of frequent follow up visits to maximize her success with intensive lifestyle modifications for her multiple health conditions.  Attestation Statements:   Reviewed by clinician on day of visit:  allergies, medications, problem list, medical history, surgical history, family history, social history, and previous encounter notes.    Adelita Cho, MD

## 2024-08-24 NOTE — Telephone Encounter (Signed)
 Pt states that she can not get here today.  She scheduled an appt with Dr. Franchot for 08/27/2024.  She states that she is not having any SOB or respiratory symptoms.  I am not sure if she needs to be triaged or advised further.

## 2024-08-25 ENCOUNTER — Telehealth (HOSPITAL_COMMUNITY): Payer: MEDICAID | Admitting: Psychiatry

## 2024-08-25 ENCOUNTER — Other Ambulatory Visit: Payer: Self-pay | Admitting: Family Medicine

## 2024-08-25 ENCOUNTER — Encounter (HOSPITAL_COMMUNITY): Payer: Self-pay | Admitting: Psychiatry

## 2024-08-25 DIAGNOSIS — F5105 Insomnia due to other mental disorder: Secondary | ICD-10-CM

## 2024-08-25 DIAGNOSIS — F988 Other specified behavioral and emotional disorders with onset usually occurring in childhood and adolescence: Secondary | ICD-10-CM

## 2024-08-25 DIAGNOSIS — F3130 Bipolar disorder, current episode depressed, mild or moderate severity, unspecified: Secondary | ICD-10-CM | POA: Diagnosis not present

## 2024-08-25 DIAGNOSIS — G4700 Insomnia, unspecified: Secondary | ICD-10-CM

## 2024-08-25 DIAGNOSIS — F411 Generalized anxiety disorder: Secondary | ICD-10-CM | POA: Diagnosis not present

## 2024-08-25 DIAGNOSIS — F900 Attention-deficit hyperactivity disorder, predominantly inattentive type: Secondary | ICD-10-CM

## 2024-08-25 MED ORDER — FLUOXETINE HCL 40 MG PO CAPS: 40.0000 mg | ORAL_CAPSULE | Freq: Two times a day (BID) | ORAL | 2 refills | Status: DC

## 2024-08-25 MED ORDER — AMPHETAMINE-DEXTROAMPHETAMINE 30 MG PO TABS: 1.0000 | ORAL_TABLET | Freq: Two times a day (BID) | ORAL | 0 refills | Status: DC

## 2024-08-25 MED ORDER — AMPHETAMINE-DEXTROAMPHETAMINE 30 MG PO TABS: 30.0000 mg | ORAL_TABLET | Freq: Two times a day (BID) | ORAL | 0 refills | Status: DC

## 2024-08-25 MED ORDER — TRAZODONE HCL 150 MG PO TABS: 150.0000 mg | ORAL_TABLET | Freq: Every day | ORAL | 2 refills | Status: DC

## 2024-08-25 MED ORDER — ALPRAZOLAM 1 MG PO TABS: 1.0000 mg | ORAL_TABLET | Freq: Two times a day (BID) | ORAL | 2 refills | Status: DC | PRN

## 2024-08-25 NOTE — Progress Notes (Signed)
 Virtual Visit via Video Note  I connected with Jane [name] on 08/25/24 at  8:40 AM EDT by a video enabled telemedicine application and verified that I am speaking with the correct person using two identifiers.  Location: Patient: home Provider: office   I discussed the limitations of evaluation and management by telemedicine and the availability of in person appointments. The patient expressed understanding and agreed to proceed.     I discussed the assessment and treatment plan with the patient. The patient was provided an opportunity to ask questions and all were answered. The patient agreed with the plan and demonstrated an understanding of the instructions.   The patient was advised to call back or seek an in-person evaluation if the symptoms worsen or if the condition fails to improve as anticipated.  I provided 20 minutes of non-face-to-face time during this encounter.   Jane Gull, MD  [facility] MD/PA/NP OP Progress Note  08/25/2024 8:49 AM Jane [name]  MRN:  [id number]  Chief Complaint:  Chief Complaint  Patient presents with   Anxiety   Depression   ADD   Follow-up   HPI: This patient is a 58 year old divorced white female who lives alone in [place].  She is on disability due to bipolar disorder.  The patient returns for follow-up after 3 months regarding her bipolar disorder, depressive type and ADD as well as generalized anxiety.  She states overall her health has been stable.  She does have an old boyfriend who has moved himself into her place and she does not really want him there.  She seems a bit reluctant to push them out however.  She states that he has been annoying although not threatening.  She is getting out quite a bit with her peers support and going to exercise classes and getting out in the community which she enjoys.  She denies significant depression thoughts of self-harm or suicide.  The Xanax  continues to help anxiety.  Her focus is proved with the  Adderall.  She is sleeping well with trazodone . Visit Diagnosis:    ICD-10-CM   1. Bipolar I disorder, most recent episode depressed (HCC)  F31.30     2. Attention deficit hyperactivity disorder (ADHD), predominantly inattentive type  F90.0     3. Insomnia due to mental disorder  F51.05       Past Psychiatric History: Long-term outpatient treatment  Past Medical History:  Past Medical History:  Diagnosis Date   ADD (attention deficit disorder)    Allergy    Anxiety    Arthritis 2021   Back pain    Bilateral swelling of feet    Bipolar 1 disorder (HCC)    Bipolar 1 disorder (HCC)    Bipolar disorder (HCC)    Blood transfusion without reported diagnosis 2020   Chewing difficulty    Chronic fatigue syndrome    Chronic kidney disease    Stage 3 kidney disease;dx by Dr. Dallas Gelineau.    Clotting disorder    I'm on a blood  thinner   Constipation    Depression 2000   Diabetes mellitus without complication (HCC)    diet controlled   Dyspnea    with exertion   GERD (gastroesophageal reflux disease)    Headache    migraines   High cholesterol    History of blood clots    History of DVT (deep vein thrombosis)    left leg   Hypertension    states under control with meds., has been on  med. x 2 years   Hypothyroidism    Joint pain    Neuromuscular disorder (HCC)    Neuropathy    Obsessive-compulsive disorder    Peripheral vascular disease    Prediabetes    Respiratory failure requiring intubation (HCC)    Restless leg syndrome    Shortness of breath    Sleep apnea    Thrombocytopenia 09/15/2019   Trigger thumb of left hand 01/2018   Trigger thumb of right hand     Past Surgical History:  Procedure Laterality Date   ABDOMINAL HYSTERECTOMY  06/2016   complete   APPLICATION OF WOUND VAC Left 04/27/2019   Procedure: APPLICATION OF WOUND VAC LEFT GROIN;  Surgeon: Sheree Penne Bruckner, MD;  Location: [facility] OR;  Service: Vascular;  Laterality: Left;   AV FISTULA  PLACEMENT Left 11/24/2018   Procedure: ARTERIOVENOUS (AV) FISTULA CREATION LEFT SFA TO LEFT FEMORAL VEIN;  Surgeon: Sheree Penne Bruckner, MD;  Location: [facility] OR;  Service: Vascular;  Laterality: Left;   BACK SURGERY     CHOLECYSTECTOMY     COLONOSCOPY WITH PROPOFOL  N/A 11/22/2019   Procedure: COLONOSCOPY WITH PROPOFOL ;  Surgeon: Therisa Bi, MD;  Location: [facility] ENDOSCOPY;  Service: Gastroenterology;  Laterality: N/A;   COLONOSCOPY WITH PROPOFOL  N/A 12/09/2022   Procedure: COLONOSCOPY WITH PROPOFOL ;  Surgeon: Therisa Bi, MD;  Location: [facility] ENDOSCOPY;  Service: Gastroenterology;  Laterality: N/A;   FEMORAL ARTERY EXPLORATION  03/30/2019   Procedure: Left Common Femoral Artery and Vein Exploration;  Surgeon: Sheree Penne Bruckner, MD;  Location: [facility] OR;  Service: Vascular;;   FEMORAL-FEMORAL BYPASS GRAFT Left 11/24/2018   Procedure: BYPASS GRAFT FEMORAL-FEMORAL VENOUS LEFT TO RIGHT PALMA PROCEDURE USING CRYOVEIN;  Surgeon: Sheree Penne Bruckner, MD;  Location: [facility] OR;  Service: Vascular;  Laterality: Left;   GROIN DEBRIDEMENT Left 04/27/2019   Procedure: QUILLIAN DEBRIDEMENT;  Surgeon: Sheree Penne Bruckner, MD;  Location[facility] OR;  Service: Vascular;  Laterality: Left;   INSERTION OF ILIAC STENT  03/30/2019   Procedure: Stent of left common, external iliac veins and left common femoral vein;  Surgeon: Sheree Penne Bruckner, MD;  Location: [facility] OR;  Service: Vascular;;   KNEE ARTHROSCOPY WITH MENISCAL REPAIR Left 11/14/2020   Procedure: LEFT KNEE ARTHROSCOPY WITH PARTIAL MEDIAL MENISCECTOMY;  Surgeon: Margrette Taft BRAVO, MD;  Location: [facility]S;  Service: Orthopedics;  Laterality: Left;   LOWER EXTREMITY INTERVENTION Left 04/05/2024   Procedure: LOWER EXTREMITY INTERVENTION;  Surgeon: Sheree Penne Bruckner, MD;  Location: [facility] INVASIVE CV LAB;  Service: Cardiovascular;  Laterality: Left;   LOWER EXTREMITY VENOGRAPHY N/A 08/17/2018   Procedure: LOWER EXTREMITY VENOGRAPHY - Central  Venogram;  Surgeon: Sheree Penne Bruckner, MD;  Location: [facility] INVASIVE CV LAB;  Service: Cardiovascular;  Laterality: N/A;   LOWER EXTREMITY VENOGRAPHY Bilateral 03/09/2019   Procedure: LOWER EXTREMITY VENOGRAPHY;  Surgeon: Sheree Penne Bruckner, MD;  Location: [facility] INVASIVE CV LAB;  Service: Cardiovascular;  Laterality: Bilateral;   LOWER EXTREMITY VENOGRAPHY Left 08/16/2019   Procedure: LOWER EXTREMITY VENOGRAPHY;  Surgeon: Sheree Penne Bruckner, MD;  Location: [facility] INVASIVE CV LAB;  Service: Cardiovascular;  Laterality: Left;   LOWER EXTREMITY VENOGRAPHY Left 03/25/2022   Procedure: LOWER EXTREMITY VENOGRAPHY;  Surgeon: Sheree Penne Bruckner, MD;  Location: [facility] INVASIVE CV LAB;  Service: Cardiovascular;  Laterality: Left;  IVUS   LOWER EXTREMITY VENOGRAPHY Left 04/05/2024   Procedure: LOWER EXTREMITY VENOGRAPHY;  Surgeon: Sheree Penne Bruckner, MD;  Location: [facility] INVASIVE CV LAB;  Service: Cardiovascular;  Laterality: Left;   LUMBAR FUSION  11/21/2000   L5-S1   LUMBAR SPINE SURGERY     x 2 others   PATCH ANGIOPLASTY Left 03/30/2019   Procedure: Patch Angioplasty of the Left Common Femoral Vein using Venosure Biologic patch;  Surgeon: Sheree Penne Bruckner, MD;  Location: [facility] OR;  Service: Vascular;  Laterality: Left;   PERIPHERAL VASCULAR INTERVENTION Left 08/16/2019   Procedure: PERIPHERAL VASCULAR INTERVENTION;  Surgeon: Sheree Penne Bruckner, MD;  Location: [facility] INVASIVE CV LAB;  Service: Cardiovascular;  Laterality: Left;  common femoral/femoral vein stent   PERIPHERAL VASCULAR INTERVENTION Left 03/25/2022   Procedure: PERIPHERAL VASCULAR INTERVENTION;  Surgeon: Sheree Penne Bruckner, MD;  Location: [facility] INVASIVE CV LAB;  Service: Cardiovascular;  Laterality: Left;  COMMON FEMORAL VEIN   PERIPHERAL VASCULAR THROMBECTOMY Left 03/25/2022   Procedure: PERIPHERAL VASCULAR THROMBECTOMY;  Surgeon: Sheree Penne Bruckner, MD;  Location: [facility] INVASIVE CV LAB;  Service:  Cardiovascular;  Laterality: Left;  EXTREMITY/IVC   PERIPHERAL VASCULAR THROMBECTOMY Left 04/05/2024   Procedure: PERIPHERAL VASCULAR THROMBECTOMY;  Surgeon: Sheree Penne Bruckner, MD;  Location: [facility] INVASIVE CV LAB;  Service: Cardiovascular;  Laterality: Left;   PERIPHERAL VASCULAR ULTRASOUND/IVUS Left 04/05/2024   Procedure: Peripheral Vascular Ultrasound/IVUS;  Surgeon: Sheree Penne Bruckner, MD;  Location: [facility] INVASIVE CV LAB;  Service: Cardiovascular;  Laterality: Left;   SPINE SURGERY     TRIGGER FINGER RELEASE Right 12/01/2017   Procedure: RELEASE TRIGGER FINGER/A-1 PULLEY RIGHT THUMB;  Surgeon: Murrell Drivers, MD;  Location: [facility];  Service: Orthopedics;  Laterality: Right;   TRIGGER FINGER RELEASE Left 01/26/2018   Procedure: LEFT TRIGGER THUMB RELEASE;  Surgeon: Murrell Drivers, MD;  Location: [facility];  Service: Orthopedics;  Laterality: Left;   ULTRASOUND GUIDANCE FOR VASCULAR ACCESS Right 03/30/2019   Procedure: Ultrasound-guided cannulation right internal jugular vein;  Surgeon: Sheree Penne Bruckner, MD;  Location: [facility] OR;  Service: Vascular;  Laterality: Right;   VSD REPAIR      Family Psychiatric History: See below  Family History:  Family History  Problem Relation Age of Onset   Heart disease Mother    Hyperlipidemia Mother    Hypertension Mother    Bipolar disorder Mother    Stroke Mother    Depression Mother    Sleep apnea Mother    Obesity Mother    Diabetes Father    Heart disease Father    Hyperlipidemia Father    Hypertension Father    Sleep apnea Father    Obesity Father    Drug abuse Daughter    ADD / ADHD Daughter    Drug abuse Daughter    Anxiety disorder Daughter    Bipolar disorder Daughter    Hypertension Sister    Hypertension Brother    Hyperlipidemia Brother    Heart disease Brother    Early death Brother    Kidney disease Brother    Bipolar disorder Maternal Aunt    Suicidality Maternal Aunt      Social History:  Social History   Socioeconomic History   Marital status: Divorced    Spouse name: Not on file   Number of children: 3   Years of education: Not on file   Highest education level: 10th grade  Occupational History   Not on file  Tobacco Use   Smoking status: Never   Smokeless tobacco: Never  Vaping Use   Vaping status: Never Used  Substance and Sexual Activity   Alcohol use: Never   Drug use: Never   Sexual activity: Not Currently  Partners: Male    Birth control/protection: Surgical  Other Topics Concern   Not on file  Social History Narrative   Not on file   Social Drivers of Health   Financial Resource Strain: Medium Risk (08/15/2024)   Overall Financial Resource Strain (CARDIA)    Difficulty of Paying Living Expenses: Somewhat hard  Food Insecurity: Food Insecurity Present (08/15/2024)   Hunger Vital Sign    Worried About Running Out of Food in the Last Year: Often true    Ran Out of Food in the Last Year: Often true  Transportation Needs: Unmet Transportation Needs (08/15/2024)   PRAPARE - Administrator, Civil Service (Medical): Yes    Lack of Transportation (Non-Medical): Yes  Physical Activity: Insufficiently Active (08/15/2024)   Exercise Vital Sign    Days of Exercise per Week: 4 days    Minutes of Exercise per Session: 20 min  Stress: No Stress Concern Present (08/15/2024)   [employer] of Occupational Health - Occupational Stress Questionnaire    Feeling of Stress: Only a little  Social Connections: Socially Isolated (08/15/2024)   Social Connection and Isolation Panel    Frequency of Communication with Friends and Family: More than three times a week    Frequency of Social Gatherings with Friends and Family: Three times a week    Attends Religious Services: Never    Active Member of Clubs or Organizations: No    Attends Banker Meetings: Not on file    Marital Status: Divorced    Allergies:   Allergies  Allergen Reactions   Aripiprazole Other (See Comments)    BECOMES  VIOLENT    Seroquel  [Quetiapine  Fumarate] Other (See Comments)    BECOMES VIOLENT   Chlorpromazine  Other (See Comments)    SEVERE ANXIETY    Gabapentin  Other (See Comments)    NIGHTMARES    Prednisone  Hives    Metabolic Disorder Labs: Lab Results  Component Value Date   HGBA1C 5.7 (A) 06/01/2024   MPG 131.24 11/06/2021   MPG 134.11 03/26/2019   No results found for: PROLACTIN Lab Results  Component Value Date   CHOL 197 06/01/2024   TRIG 248 (H) 06/01/2024   HDL 41 06/01/2024   CHOLHDL 4.8 (H) 06/01/2024   VLDL 37 11/06/2021   LDLCALC 113 (H) 06/01/2024   LDLCALC 96 12/02/2023   Lab Results  Component Value Date   TSH 1.370 06/01/2024   TSH 1.200 12/02/2023    Therapeutic Level Labs: No results found for: LITHIUM Lab Results  Component Value Date   VALPROATE 61.2 03/23/2014   No results found for: CBMZ  Current Medications: Current Outpatient Medications  Medication Sig Dispense Refill   AJOVY 225 MG/1.5ML SOAJ Inject 1 mL into the skin every 30 (thirty) days.     ALPRAZolam  (XANAX ) 1 MG tablet Take 1 tablet (1 mg total) by mouth 2 (two) times daily as needed. for anxiety 60 tablet 2   amphetamine -dextroamphetamine  (ADDERALL) 30 MG tablet Take 1 tablet by mouth 2 (two) times daily. 60 tablet 0   amphetamine -dextroamphetamine  (ADDERALL) 30 MG tablet Take 1 tablet by mouth 2 (two) times daily. 60 tablet 0   amphetamine -dextroamphetamine  (ADDERALL) 30 MG tablet Take 1 tablet by mouth 2 (two) times daily. 60 tablet 0   aspirin  EC 81 MG tablet Take 81 mg by mouth daily.     atorvastatin  (LIPITOR) 40 MG tablet TAKE 1 TABLET BY MOUTH DAILY 90 tablet 3   cetirizine  (ZYRTEC ) 10 MG tablet  TAKE 1 TABLET BY MOUTH DAILY 90 tablet 3   ciprofloxacin  (CIPRO ) 250 MG tablet Take 1 tablet (250 mg total) by mouth 2 (two) times daily. 14 tablet 0   cromolyn (OPTICROM) 4 % ophthalmic solution  Place 1 drop into both eyes 2 (two) times daily. (Patient not taking: Reported on 08/23/2024)     cyclobenzaprine  (FLEXERIL ) 5 MG tablet Take 1 tablet (5 mg total) by mouth 3 (three) times daily as needed for muscle spasms. 90 tablet 2   dabigatran (PRADAXA) 150 MG CAPS capsule Take 1 capsule (150 mg total) by mouth 2 (two) times daily. 60 capsule 0   famotidine  (PEPCID ) 20 MG tablet Take 20 mg by mouth daily.     FLUoxetine  (PROZAC ) 40 MG capsule Take 1 capsule (40 mg total) by mouth 2 (two) times daily. 180 capsule 2   HYDROcodone -acetaminophen  (NORCO) 10-325 MG tablet Take 1 tablet by mouth every 6 (six) hours as needed. To last 30 days from fill date 120 tablet 0   levothyroxine  (SYNTHROID ) 125 MCG tablet TAKE 1 TABLET BY MOUTH DAILY BEFORE BREAKFAST 90 tablet 3   LINZESS  290 MCG CAPS capsule TAKE ONE CAPSULE BY MOUTH DAILY 30 capsule 2   losartan  (COZAAR ) 25 MG tablet Take 0.5 tablets (12.5 mg total) by mouth daily.     mirabegron  ER (MYRBETRIQ ) 50 MG TB24 tablet Take 1 tablet (50 mg total) by mouth daily. 30 tablet 11   Multiple Vitamin (MULTIVITAMIN) capsule Take 1 capsule by mouth daily.     nitrofurantoin  (MACRODANTIN ) 100 MG capsule Take 1 capsule (100 mg total) by mouth daily. 30 capsule 11   Omega-3 Fatty Acids (OMEGA 3 PO) Take 1 capsule by mouth daily.     pantoprazole  (PROTONIX ) 40 MG tablet TAKE 1 TABLET BY MOUTH DAILY 90 tablet 3   pimecrolimus  (ELIDEL ) 1 % cream Apply to affected areas rash in body folds and legs once to twice daily until improved. 30 g 3   polyethylene glycol powder (GLYCOLAX /MIRALAX ) 17 GM/SCOOP powder TAKE 17 Gram BY MOUTH DAILY 850 g 1   potassium chloride  (KLOR-CON  M) 10 MEQ tablet Take 1 tablet (10 mEq total) by mouth 3 (three) times daily. 270 tablet 3   pregabalin  (LYRICA ) 100 MG capsule TAKE ONE CAPSULE BY MOUTH FOUR TIMES DAILY 120 capsule 5   RESTASIS  0.05 % ophthalmic emulsion Place 1 drop into both eyes 2 (two) times daily. (Patient not taking:  Reported on 08/23/2024)     rOPINIRole  (REQUIP ) 2 MG tablet TAKE 1 TABLET BY MOUTH TWICE DAILY 180 tablet 1   Semaglutide , 1 MG/DOSE, 4 MG/3ML SOPN Inject 1 mg as directed once a week. 9 mL 0   solifenacin  (VESICARE ) 5 MG tablet Take 1 tablet (5 mg total) by mouth daily. 30 tablet 11   traZODone  (DESYREL ) 150 MG tablet Take 1 tablet (150 mg total) by mouth at bedtime. 30 tablet 2   triamcinolone  ointment (KENALOG ) 0.5 % Apply 1 Application topically 2 (two) times daily. 30 g 0   UBRELVY  100 MG TABS Take 100 mg by mouth daily as needed (migraines). May take a second 100 mg dose 2 hours later as needed for migraines     No current facility-administered medications for this visit.     Musculoskeletal: Strength & Muscle Tone: within normal limits Gait & Station: normal Patient leans: N/A  Psychiatric Specialty Exam: Review of Systems  All other systems reviewed and are negative.   There were no vitals taken for this visit.There  is no height or weight on file to calculate BMI.  General Appearance: Casual, Neat, and Well Groomed  Eye Contact:  Good  Speech:  Clear and Coherent  Volume:  Normal  Mood:  Euthymic  Affect:  Congruent  Thought Process:  Goal Directed  Orientation:  Full (Time, Place, and Person)  Thought Content: WDL   Suicidal Thoughts:  No  Homicidal Thoughts:  No  Memory:  Immediate;   Good Recent;   Good Remote;   NA  Judgement:  Good  Insight:  Fair  Psychomotor Activity:  Normal  Concentration:  Concentration: Good and Attention Span: Good  Recall:  Good  Fund of Knowledge: Good  Language: Good  Akathisia:  No  Handed:  Right  AIMS (if indicated): not done  Assets:  Communication Skills Desire for Improvement Resilience Social Support Talents/Skills  ADL's:  Intact  Cognition: WNL  Sleep:  Good   Screenings: GAD-7    Flowsheet Row Office Visit from 06/01/2024 in [facility] Office Visit from 02/25/2024 in [facility] Office Visit from 10/06/2023 in [facility]  Total GAD-7 Score 7 0 5   PHQ2-9    Flowsheet Row Office Visit from 06/01/2024 in [facility] Office Visit from 03/25/2024 in [facility] Office Visit from 03/16/2024 in [facility] [place] Visit from 02/25/2024 in [facility] Office Visit from 12/09/2023 in [facility] Total Score 2 0 0 4 4  PHQ-9 Total Score 15 3 -- 20 --   Flowsheet Row Admission (Discharged) from 04/05/2024 in [facility] 4E CV SURGICAL PROGRESSIVE CARE ED from 01/30/2023 in [facility] Emergency Department at [facility] Video Visit from 01/02/2023 in [facility] at [place]  C-SSRS RISK CATEGORY No Risk No Risk No Risk     Assessment and Plan: This patient is a 58 year old female with a history of bipolar disorder depressed type generalized anxiety disorder and ADD without hyperactivity.  She continues to do well on her current regimen.  She will continue Prozac  80 mg daily for depression, Xanax  1 mg twice daily as needed for anxiety, Adderall 20 mg twice daily for ADD and trazodone  150 mg at bedtime for sleep.  She will return to see me in 3 months  Collaboration of Care: Collaboration of Care: Primary Care Provider AEB notes will be shared with PCP at patient's request  Patient/Guardian was advised Release of Information must be obtained prior to any record release in order to collaborate their care with an outside provider. Patient/Guardian was advised if they have not already done so to contact the registration department to sign all necessary forms in order for us  to release information regarding their care.   Consent: Patient/Guardian gives verbal consent for treatment and assignment of  benefits for services provided during this visit. Patient/Guardian expressed understanding and agreed to proceed.    Jane Gull, MD 08/25/2024, 8:49 AM

## 2024-08-26 NOTE — Telephone Encounter (Signed)
 Requested Prescriptions  Pending Prescriptions Disp Refills   losartan  (COZAAR ) 25 MG tablet [Pharmacy Med Name: losartan  25 mg tablet] 30 tablet 1    Sig: TAKE 1 TABLET BY MOUTH DAILY     Cardiovascular:  Angiotensin Receptor Blockers Failed - 08/26/2024  5:05 PM      Failed - Cr in normal range and within 180 days    Creatinine, Ser  Date Value Ref Range Status  08/16/2024 1.24 (H) 0.57 - 1.00 mg/dL Final         Passed - K in normal range and within 180 days    Potassium  Date Value Ref Range Status  08/16/2024 4.0 3.5 - 5.2 mmol/L Final         Passed - Patient is not pregnant      Passed - Last BP in normal range    BP Readings from Last 1 Encounters:  08/23/24 132/78         Passed - Valid encounter within last 6 months    Recent Outpatient Visits           1 week ago Hypertension associated with diabetes [facility])   [place] [facility] [place], Jon HERO, MD   1 month ago Hypertension associated with diabetes [facility])   [place] [facility] [place], Jon HERO, MD   2 months ago Type 2 diabetes mellitus with hyperglycemia, without long-term current use of insulin  [place] [facility])   [place] [facility] [place], Jon HERO, MD   4 months ago Procedure, test, or exam not indicated   [place] [facility] [place], Curtis LABOR, FNP   5 months ago Cervical radiculopathy   [place] [facility] [place], Jon HERO, MD       Future Appointments             In 1 month MacDiarmid, [place], MD [facility] [place]             LINZESS  290 MCG CAPS capsule [Pharmacy Med Name: Linzess  290 mcg capsule] 30 capsule 2    Sig: TAKE ONE CAPSULE BY MOUTH DAILY     Gastroenterology: Irritable Bowel Syndrome Passed - 08/26/2024  5:05 PM      Passed - Valid encounter within last 12 months    Recent Outpatient Visits           1 week ago Hypertension associated with diabetes [facility])   [facility] [place], Jon HERO, MD   1 month ago Hypertension associated with diabetes [facility])   [place] [facility] [place], Jon HERO, MD   2 months ago Type 2 diabetes mellitus with hyperglycemia, without long-term current use of insulin  [facility])   [place] [facility] [place], Jon HERO, MD   4 months ago Procedure, test, or exam not indicated   [facility] [place], Curtis LABOR, FNP   5 months ago Cervical radiculopathy   [place] [facility] [place], Jon HERO, MD       Future Appointments             In 1 month MacDiarmid, Glendia, MD [facility] [place]

## 2024-08-26 NOTE — Telephone Encounter (Signed)
 Requested medications are due for refill today.  Unsure  Requested medications are on the active medications list.  yes  Last refill. 08/16/2024  Future visit scheduled.   yes  Notes to clinic.  Rx went as print on 10/13    Requested Prescriptions  Pending Prescriptions Disp Refills   losartan  (COZAAR ) 25 MG tablet [Pharmacy Med Name: losartan  25 mg tablet] 30 tablet 1    Sig: TAKE 1 TABLET BY MOUTH DAILY     Cardiovascular:  Angiotensin Receptor Blockers Failed - 08/26/2024  5:06 PM      Failed - Cr in normal range and within 180 days    Creatinine, Ser  Date Value Ref Range Status  08/16/2024 1.24 (H) 0.57 - 1.00 mg/dL Final         Passed - K in normal range and within 180 days    Potassium  Date Value Ref Range Status  08/16/2024 4.0 3.5 - 5.2 mmol/L Final         Passed - Patient is not pregnant      Passed - Last BP in normal range    BP Readings from Last 1 Encounters:  08/23/24 132/78         Passed - Valid encounter within last 6 months    Recent Outpatient Visits           1 week ago Hypertension associated with diabetes [facility])   [place] [facility] [place], Jon HERO, MD   1 month ago Hypertension associated with diabetes [facility])   [facility] [place], Jon HERO, MD   2 months ago Type 2 diabetes mellitus with hyperglycemia, without long-term current use of insulin  [facility])   [place] [facility] [place], Jon HERO, MD   4 months ago Procedure, test, or exam not indicated   [place] [facility] Kelly, Curtis LABOR, FNP   5 months ago Cervical radiculopathy   [place] [facility] [place], Jon HERO, MD       Future Appointments             In 1 month MacDiarmid, Glendia, MD [facility] [place]            Signed Prescriptions Disp Refills   linaclotide  (LINZESS ) 290 MCG CAPS capsule 90 capsule 1    Sig: TAKE ONE CAPSULE BY MOUTH DAILY      Gastroenterology: Irritable Bowel Syndrome Passed - 08/26/2024  5:06 PM      Passed - Valid encounter within last 12 months    Recent Outpatient Visits           1 week ago Hypertension associated with diabetes [facility])   [place] [facility] [place], Jon HERO, MD   1 month ago Hypertension associated with diabetes [facility])   [place] [facility] [place], Jon HERO, MD   2 months ago Type 2 diabetes mellitus with hyperglycemia, without long-term current use of insulin  [facility])   [place] [facility] [place], Jon HERO, MD   4 months ago Procedure, test, or exam not indicated   [place] [facility] [place], Curtis LABOR, FNP   5 months ago Cervical radiculopathy    [facility] [place], Jon HERO, MD       Future Appointments             In 1 month MacDiarmid, Glendia, MD [facility] [place]

## 2024-08-27 ENCOUNTER — Ambulatory Visit (INDEPENDENT_AMBULATORY_CARE_PROVIDER_SITE_OTHER): Payer: MEDICAID

## 2024-08-27 ENCOUNTER — Ambulatory Visit: Payer: MEDICAID

## 2024-08-27 VITALS — BP 134/57 | HR 57 | Resp 16 | Ht 69.0 in | Wt 240.0 lb

## 2024-08-27 DIAGNOSIS — M7989 Other specified soft tissue disorders: Secondary | ICD-10-CM

## 2024-08-27 NOTE — Progress Notes (Signed)
 Acute visit   Patient: Jane [name]   DOB: 1966-05-13   58 y.o. Female  MRN: [id number] PCP: Myrla Jon HERO, MD   Chief Complaint  Patient presents with   Acute Visit    Left leg swelling ( for last couple of days)   Subjective    Discussed the use of AI scribe software for clinical note transcription with the patient, who gave verbal consent to proceed.  History of Present Illness Jane [name] is a 58 year old female with a history of blood clots who presents with left leg swelling and pain.  She has been experiencing swelling and pain in her left leg since the beginning of the week. The swelling involves the entire leg, with the thigh and leg becoming larger than usual. The swelling fluctuates but tends to increase by the end of the day, causing discomfort when wearing socks.  She has a history of blood clots and has undergone procedures including the placement of two stents in her chest. She recently switched from warfarin to Pradaxa, which she has been taking twice daily for about a week without missing any doses. Despite this, she has experienced blood clots even while on blood thinners in the past. Her leg does not get hot when she has blood clots, but it does swell.  She reports pain in her left leg, which she attributes to a pinched sciatic nerve following three back surgeries. The pain is constant and not relieved by pain medication. She experiences numbness in her feet, making it difficult to detect pain, but notes stiffness and an inability to bend her big toe, which is swollen and hard.  She is scheduled to see her orthopedic doctor for a knee replacement. The pain in her knee has remained consistent and has not worsened in recent days. No recent falls or injuries to the leg. No acute pain in the thigh, but reports numbness.  Denies shortness of breath, chest pain, or palpitations.  Review of systems as noted in HPI.   Objective    BP (!) 134/57 (BP  Location: Right Arm, Patient Position: Sitting, Cuff Size: Normal)   Pulse (!) 57   Resp 16   Ht 5' 9 (1.753 m)   Wt 240 lb (108.9 kg)   LMP  (LMP Unknown)   SpO2 97%   BMI 35.44 kg/m  Physical Exam Constitutional:      Appearance: Normal appearance.  HENT:     Head: Normocephalic and atraumatic.     Mouth/Throat:     Mouth: Mucous membranes are moist.  Eyes:     Pupils: Pupils are equal, round, and reactive to light.  Pulmonary:     Effort: Pulmonary effort is normal.  Musculoskeletal:     Left lower leg: Edema present.     Comments: L leg: 1+ pitting edema present from upper thigh to foot. No erythema or warmth. R leg: trace edema present  Skin:    General: Skin is warm.  Neurological:     General: No focal deficit present.     Mental Status: She is alert.       No results found for any visits on 08/27/24.  Assessment & Plan     Problem List Items Addressed This Visit   None Visit Diagnoses       Left leg swelling    -  Primary   Relevant Orders   US  Venous Img Lower Bilateral (DVT)      Assessment & Plan  Suspected acute deep vein thrombosis of left lower extremity Suspected acute DVT in the left leg with swelling and pain, despite anticoagulation therapy. Recently switched from warfarin to Pradaxa a few weeks ago. Has not missed any doses of the Pradaxa. 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 - Order stat Doppler ultrasound of both legs to assess for DVT. - Discuss with hematologist regarding potential changes in anticoagulation therapy if DVT is confirmed. - Coordinate with scheduler for same-day ultrasound appointment. - Return precautions given    No orders of the defined types were placed in this encounter.    No follow-ups on file.      Isaiah DELENA Pepper, MD  [facility] [phone] (phone) [phone] (fax)

## 2024-08-29 NOTE — Assessment & Plan Note (Signed)
 Recent change from Coumadin  to Pradaxa to help manage increased risk of blood clots.  Will follow-up on current treatment after patient follows up with hematology.

## 2024-08-29 NOTE — Assessment & Plan Note (Signed)
 Blood pressure controlled at this appointment.  Patient does report increased pain recently so pending continued monitoring of BP may need to make pharmacotherapy modifications.

## 2024-08-29 NOTE — Assessment & Plan Note (Signed)
 Patient reports she has upcoming assessment for knee replacement surgery.  Will follow-up on treatment plan at next appointment.

## 2024-08-30 ENCOUNTER — Ambulatory Visit: Payer: Self-pay

## 2024-08-30 ENCOUNTER — Ambulatory Visit (HOSPITAL_COMMUNITY)
Admission: RE | Admit: 2024-08-30 | Discharge: 2024-08-30 | Disposition: A | Discharge status: Home / Self Care | Payer: MEDICAID | Source: Ambulatory Visit

## 2024-08-30 DIAGNOSIS — M7989 Other specified soft tissue disorders: Secondary | ICD-10-CM | POA: Diagnosis present

## 2024-08-31 ENCOUNTER — Telehealth: Payer: Self-pay

## 2024-08-31 ENCOUNTER — Telehealth (INDEPENDENT_AMBULATORY_CARE_PROVIDER_SITE_OTHER): Payer: Self-pay

## 2024-08-31 NOTE — Telephone Encounter (Signed)
 Ozempic  Prior Authorization  Your request has been approved. Authorization Expiration Date: August 31, 2025.

## 2024-08-31 NOTE — Telephone Encounter (Signed)
 Called patient to inform that Medicaid doesn't cover PTNS left message to call back

## 2024-09-01 ENCOUNTER — Ambulatory Visit: Payer: MEDICAID | Admitting: Podiatry

## 2024-09-08 ENCOUNTER — Encounter: Payer: MEDICAID | Admitting: Nurse Practitioner

## 2024-09-09 ENCOUNTER — Other Ambulatory Visit: Payer: Self-pay | Admitting: Nurse Practitioner

## 2024-09-09 DIAGNOSIS — G894 Chronic pain syndrome: Secondary | ICD-10-CM

## 2024-09-09 DIAGNOSIS — M961 Postlaminectomy syndrome, not elsewhere classified: Secondary | ICD-10-CM

## 2024-09-09 DIAGNOSIS — E1142 Type 2 diabetes mellitus with diabetic polyneuropathy: Secondary | ICD-10-CM

## 2024-09-09 DIAGNOSIS — G8929 Other chronic pain: Secondary | ICD-10-CM

## 2024-09-09 DIAGNOSIS — Z79899 Other long term (current) drug therapy: Secondary | ICD-10-CM

## 2024-09-16 ENCOUNTER — Ambulatory Visit: Payer: MEDICAID | Attending: Nurse Practitioner | Admitting: Nurse Practitioner

## 2024-09-16 ENCOUNTER — Encounter: Payer: Self-pay | Admitting: Nurse Practitioner

## 2024-09-16 VITALS — BP 142/83 | HR 58 | Temp 97.2°F | Resp 18 | Ht 69.0 in | Wt 237.0 lb

## 2024-09-16 DIAGNOSIS — G894 Chronic pain syndrome: Secondary | ICD-10-CM | POA: Insufficient documentation

## 2024-09-16 DIAGNOSIS — E1142 Type 2 diabetes mellitus with diabetic polyneuropathy: Secondary | ICD-10-CM | POA: Diagnosis not present

## 2024-09-16 DIAGNOSIS — M961 Postlaminectomy syndrome, not elsewhere classified: Secondary | ICD-10-CM | POA: Diagnosis not present

## 2024-09-16 DIAGNOSIS — M7918 Myalgia, other site: Secondary | ICD-10-CM | POA: Insufficient documentation

## 2024-09-16 DIAGNOSIS — Z7985 Long-term (current) use of injectable non-insulin antidiabetic drugs: Secondary | ICD-10-CM

## 2024-09-16 DIAGNOSIS — G8929 Other chronic pain: Secondary | ICD-10-CM | POA: Diagnosis present

## 2024-09-16 DIAGNOSIS — Z79899 Other long term (current) drug therapy: Secondary | ICD-10-CM | POA: Insufficient documentation

## 2024-09-16 MED ORDER — HYDROCODONE-ACETAMINOPHEN 10-325 MG PO TABS: 1.0000 | ORAL_TABLET | Freq: Four times a day (QID) | ORAL | 0 refills | Status: AC | PRN

## 2024-09-16 MED ORDER — NALOXONE HCL 4 MG/0.1ML NA LIQD: 1.0000 | NASAL | 1 refills | Status: AC | PRN

## 2024-09-16 NOTE — Progress Notes (Signed)
 PROVIDER NOTE: Interpretation of information contained herein should be left to medically-trained personnel. Specific patient instructions are provided elsewhere under Patient Instructions section of medical record. This document was created in part using AI and STT-dictation technology, any transcriptional errors that may result from this process are unintentional.  Patient: Jane [name]  Service: E/M   PCP: Myrla Jon HERO, MD  DOB: 27-May-1966  DOS: 09/16/2024  Provider: [name] MARLA Blanch, NP  MRN: [id number]  Delivery: Face-to-face  Specialty: Interventional Pain Management  Type: Established Patient  Setting: Ambulatory outpatient facility  Specialty designation: 09  Referring Prov.: Bacigalupo, Jon HERO, MD  Location: Outpatient office facility       History of present illness (HPI) [name], a 58 y.o. year old female, is here today because of her low back pain. [name] primary complain today is Back Pain  Pertinent problems: [name]  has a History of DVT (deep vein thrombosis); Bipolar 1 disorder (HCC); Depression, major, recurrent (HCC); Radicular low back pain; May-Thurner syndrome; Iliac vein stenosis, left; Morbid obesity (HCC); Peripheral neuropathy; Chronic low back pain (Bilateral) w/sciatica ((Bilateral); Chronic anticoagulation (Coumadin ); DDD (degenerative disc disease), cervical; DDD (degenerative disc disease), lumbar; Failed back surgical syndrome; Diabetic peripheral neuropathy (HCC); Neurogenic pain; Chronic musculoskeletal pain; and Chronic pain syndrome on their pertinent problem list.   Pain Assessment: Severity of Chronic pain is reported as a 8 /10. Location: Back Lower/Down Left to knee. Onset: More than a month ago. Quality: Aching, Constant. Timing: Constant. Modifying factor(s): Moving Positions. Vitals:  height is 5' 9 (1.753 m) and weight is 237 lb (107.5 kg). Her temporal temperature is 97.2 F (36.2 C) (abnormal). Her blood pressure is  142/83 (abnormal) and her pulse is 58 (abnormal). Her respiration is 18 and oxygen saturation is 96%.  BMI: Estimated body mass index is 35 kg/m as calculated from the following:   Height as of this encounter: 5' 9 (1.753 m).   Weight as of this encounter: 237 lb (107.5 kg).  Last encounter: 06/08/2024. Last procedure: Visit date not found.  Reason for encounter: medication management.   Discussed the use of AI scribe software for clinical note transcription with the patient, who gave verbal consent to proceed.  History of Present Illness   Jane [name] is a 58 year old female who presents for pain management consultation. No change in medical history since last visit.  Patient's pain is at baseline.  Patient continues multimodal pain regimen as prescribed.  States that it provides pain relief and improvement in functional status.   She is scheduled for left knee replacement surgery, initially set for November 2nd but rescheduled to December 16th due to being on a new blood thinner. She has experienced knee pain for a while, described as 'bone on bone,' and has undergone various treatments including injections and therapy without significant relief.  She has chronic low back pain radiating down her left leg to her foot, consistent with sciatic nerve pain. She has had three back surgeries in the past.  She experiences neuropathy, more pronounced on the left side. She has previously found relief with Qutenza  treatments, but has not had one since 2024 due to Medicaid coverage issues. She is currently taking Lyrica  for neuropathic pain.  Her current medication regimen includes Norco 10/325 mg, which she takes without any adverse effects. She was previously on warfarin but has been switched to a different blood thinner, taking two pills a day.     Pharmacotherapy Assessment   Hydrocodone -acetaminophen  (  Norco) 10-325 mg tablet every 6 hours as needed for pain. MME=40 Monitoring: [place] PMP: PDMP  reviewed during this encounter.       Pharmacotherapy: No side-effects or adverse reactions reported. Compliance: No problems identified. Effectiveness: Clinically acceptable.  Erlene Doyal SAUNDERS, [place]  09/16/2024 10:16 AM  Sign when Signing Visit Nursing Pain Medication Assessment:  Safety precautions to be maintained throughout the outpatient stay will include: orient to surroundings, keep bed in low position, maintain call bell within reach at all times, provide assistance with transfer out of bed and ambulation.  Medication Inspection Compliance: Pill count conducted under aseptic conditions, in front of the patient. Neither the pills nor the bottle was removed from the patient's sight at any time. Once count was completed pills were immediately returned to the patient in their original bottle.  Medication: Hydrocodone /APAP Pill/Patch Count: 13 of 120 pills/patches remain Pill/Patch Appearance: Markings consistent with prescribed medication Bottle Appearance: Standard pharmacy container. Clearly labeled. Filled Date: 16 / 17 / 2025 Last Medication intake:  Today    UDS:  Summary  Date Value Ref Range Status  06/08/2024 FINAL  Final    Comment:    ==================================================================== [employer] [facility] 13 (MW) ==================================================================== Test                             Result       Flag       Units  Drug Present and Declared for Prescription Verification   Hydrocodone                     1902         EXPECTED   ng/mg creat   Dihydrocodeine                 200          EXPECTED   ng/mg creat   Norhydrocodone                 1427         EXPECTED   ng/mg creat    Sources of hydrocodone  include scheduled prescription medications.    Dihydrocodeine and norhydrocodone are expected metabolites of    hydrocodone . Dihydrocodeine is also available as a scheduled    prescription medication.  Drug Absent but Declared for  Prescription Verification   Amphetamine                     Not Detected UNEXPECTED ng/mg creat   Alprazolam                      Not Detected UNEXPECTED ng/mg creat ==================================================================== Test                      Result    Flag   Units      Ref Range   Creatinine              107              mg/dL      >=79 ==================================================================== Declared Medications:  The flagging and interpretation on this report are based on the  following declared medications.  Unexpected results may arise from  inaccuracies in the declared medications.   **Note: The testing scope of this panel includes these medications:   Alprazolam  (Xanax )  Amphetamine  (Adderall)  Hydrocodone  (Norco)   **Note: The testing scope of this panel does  not include the  following reported medications:   Acetaminophen  (Norco)  Aspirin   Atorvastatin  (Lipitor)  Cetirizine  (Zyrtec )  Cyclobenzaprine  (Flexeril )  Cyclosporine  (Restasis )  Eye Drops  Famotidine  (Pepcid )  Fluoxetine  (Prozac )  Fremanezumab (Ajovy)  Levothyroxine  (Synthroid )  Linaclotide  (Linzess )  Mirabegron  (Myrbetriq )  Multivitamin  Nitrofurantoin  (Macrodantin )  Omega-3 Fatty Acids  Pantoprazole  (Protonix )  Polyethylene Glycol (MiraLAX )  Potassium (Klor-Con )  Pregabalin  (Lyrica )  Ropinirole  (Requip )  Semaglutide   Solifenacin  (Vesicare )  Topical  Trazodone  (Desyrel )  Triamcinolone  (Kenalog )  Ubrogepant  (Ubrelvy )  Warfarin (Coumadin ) ==================================================================== For clinical consultation, please call [phone]. ====================================================================     No results found for: CBDTHCR No results found for: D8THCCBX No results found for: D9THCCBX  ROS  Constitutional: Denies any fever or chills Gastrointestinal: No reported hemesis, hematochezia, vomiting, or acute GI  distress Musculoskeletal: low back pain Neurological: No reported episodes of acute onset apraxia, aphasia, dysarthria, agnosia, amnesia, paralysis, loss of coordination, or loss of consciousness  Medication Review  ALPRAZolam , FLUoxetine , Fremanezumab-vfrm, HYDROcodone -acetaminophen , Omega-3 Fatty Acids, Semaglutide  (1 MG/DOSE), Ubrogepant , amphetamine -dextroamphetamine , aspirin  EC, atorvastatin , cetirizine , ciprofloxacin , cromolyn, cycloSPORINE , cyclobenzaprine , dabigatran, famotidine , levothyroxine , linaclotide , losartan , mirabegron  ER, multivitamin, nitrofurantoin , pantoprazole , pimecrolimus , polyethylene glycol powder, potassium chloride , pregabalin , rOPINIRole , solifenacin , traZODone , and triamcinolone  ointment  History Review  Allergy: [name] is allergic to aripiprazole, seroquel  [quetiapine  fumarate], chlorpromazine , gabapentin , and prednisone . Drug: [name]  reports no history of drug use. Alcohol:  reports no history of alcohol use. Tobacco:  reports that she has never smoked. She has never used smokeless tobacco. Social: [name]  reports that she has never smoked. She has never used smokeless tobacco. She reports that she does not drink alcohol and does not use drugs. Medical:  has a past medical history of ADD (attention deficit disorder), Allergy, Anxiety, Arthritis (2021), Back pain, Bilateral swelling of feet, Bipolar 1 disorder (HCC), Bipolar 1 disorder (HCC), Bipolar disorder (HCC), Blood transfusion without reported diagnosis (2020), Chewing difficulty, Chronic fatigue syndrome, Chronic kidney disease, Clotting disorder, Constipation, Depression (2000), Diabetes mellitus without complication (HCC), Dyspnea, GERD (gastroesophageal reflux disease), Headache, High cholesterol, History of blood clots, History of DVT (deep vein thrombosis), Hypertension, Hypothyroidism, Joint pain, Neuromuscular disorder (HCC), Neuropathy, Obsessive-compulsive disorder, Peripheral  vascular disease, Prediabetes, Respiratory failure requiring intubation (HCC), Restless leg syndrome, Shortness of breath, Sleep apnea, Thrombocytopenia (09/15/2019), Trigger thumb of left hand (01/2018), and Trigger thumb of right hand. Surgical: Ms. Murdock  has a past surgical history that includes Cholecystectomy; Trigger finger release (Right, 12/01/2017); Abdominal hysterectomy (06/2016); Lumbar fusion (11/21/2000); Lumbar spine surgery; Trigger finger release (Left, 01/26/2018); LOWER EXTREMITY VENOGRAPHY (N/A, 08/17/2018); Femoral-femoral Bypass Graft (Left, 11/24/2018); AV fistula placement (Left, 11/24/2018); LOWER EXTREMITY VENOGRAPHY (Bilateral, 03/09/2019); Patch angioplasty (Left, 03/30/2019); Ultrasound guidance for vascular access (Right, 03/30/2019); Femoral artery debridement (03/30/2019); Insertion of iliac stent (03/30/2019); Groin debridement (Left, 04/27/2019); Application if wound vac (Left, 04/27/2019); LOWER EXTREMITY VENOGRAPHY (Left, 08/16/2019); PERIPHERAL VASCULAR INTERVENTION (Left, 08/16/2019); Back surgery; Colonoscopy with propofol  (N/A, 11/22/2019); Knee arthroscopy with meniscal repair (Left, 11/14/2020); LOWER EXTREMITY VENOGRAPHY (Left, 03/25/2022); PERIPHERAL VASCULAR THROMBECTOMY (Left, 03/25/2022); PERIPHERAL VASCULAR INTERVENTION (Left, 03/25/2022); Colonoscopy with propofol  (N/A, 12/09/2022); Spine surgery; LOWER EXTREMITY VENOGRAPHY (Left, 04/05/2024); LOWER EXTREMITY INTERVENTION (Left, 04/05/2024); PERIPHERAL VASCULAR THROMBECTOMY (Left, 04/05/2024); Peripheral Vascular Ultrasound/IVUS (Left, 04/05/2024); and VSD repair. Family: family history includes ADD / ADHD in her daughter; Anxiety disorder in her daughter; Bipolar disorder in her daughter, maternal aunt, and mother; Depression in her mother; Diabetes in her father; Drug abuse in her daughter and daughter; Early death in her brother; Heart  disease in her brother, father, and mother; Hyperlipidemia in her  brother, father, and mother; Hypertension in her brother, father, mother, and sister; Kidney disease in her brother; Obesity in her father and mother; Sleep apnea in her father and mother; Stroke in her mother; Suicidality in her maternal aunt.  Laboratory Chemistry Profile   Renal Lab Results  Component Value Date   BUN 20 08/16/2024   CREATININE 1.24 (H) 08/16/2024   BCR 16 08/16/2024   GFRAA 53 (L) 07/18/2020   GFRNONAA 43 (L) 04/06/2024    Hepatic Lab Results  Component Value Date   AST 19 06/01/2024   ALT 19 06/01/2024   ALBUMIN  4.4 06/01/2024   ALKPHOS 95 06/01/2024   HCVAB NON REACTIVE 11/06/2021    Electrolytes Lab Results  Component Value Date   NA 140 08/16/2024   K 4.0 08/16/2024   CL 101 08/16/2024   CALCIUM  9.4 08/16/2024   MG 2.1 08/06/2022   PHOS 4.0 04/04/2019    Bone Lab Results  Component Value Date   VD25OH 48.8 03/14/2023    Inflammation (CRP: Acute Phase) (ESR: Chronic Phase) Lab Results  Component Value Date   CRP 11 (H) 02/16/2020   ESRSEDRATE 33 02/16/2020   LATICACIDVEN 1.2 01/05/2020         Note: Above Lab results reviewed.  Recent Imaging Review  US  Venous Img Lower Bilateral (DVT) CLINICAL DATA:  Left lower extremity edema. History of prior left lower extremity DVT prior thrombectomy venous stenting.  EXAM: BILATERAL LOWER EXTREMITY VENOUS DOPPLER ULTRASOUND  TECHNIQUE: Gray-scale sonography with graded compression, as well as color Doppler and duplex ultrasound were performed to evaluate the lower extremity deep venous systems from the level of the common femoral vein and including the common femoral, femoral, profunda femoral, popliteal and calf veins including the posterior tibial, peroneal and gastrocnemius veins when visible. The superficial great saphenous vein was also interrogated. Spectral Doppler was utilized to evaluate flow at rest and with distal augmentation maneuvers in the common femoral, femoral and popliteal  veins.  COMPARISON:  04/03/2023  FINDINGS: RIGHT LOWER EXTREMITY  Common Femoral Vein: No evidence of thrombus. Normal compressibility, respiratory phasicity and response to augmentation.  Saphenofemoral Junction: No evidence of thrombus. Normal compressibility and flow on color Doppler imaging.  Profunda Femoral Vein: No evidence of thrombus. Normal compressibility and flow on color Doppler imaging.  Femoral Vein: No evidence of thrombus. Normal compressibility, respiratory phasicity and response to augmentation.  Popliteal Vein: No evidence of thrombus. Normal compressibility, respiratory phasicity and response to augmentation.  Calf Veins: No evidence of thrombus. Normal compressibility and flow on color Doppler imaging.  Superficial Great Saphenous Vein: No evidence of thrombus. Normal compressibility.  Venous Reflux:  None.  Other Findings: No evidence of superficial thrombophlebitis or abnormal fluid collection.  LEFT LOWER EXTREMITY  Common Femoral Vein: No evidence of thrombus. Venous stent present. Normal compressibility, respiratory phasicity and response to augmentation.  Saphenofemoral Junction: No evidence of thrombus. Normal compressibility and flow on color Doppler imaging.  Profunda Femoral Vein: No evidence of thrombus. Normal compressibility and flow on color Doppler imaging.  Femoral Vein: No evidence of thrombus. Normal compressibility, respiratory phasicity and response to augmentation.  Popliteal Vein: No evidence of thrombus. Normal compressibility, respiratory phasicity and response to augmentation.  Calf Veins: No evidence of thrombus. Normal compressibility and flow on color Doppler imaging.  Superficial Great Saphenous Vein: No evidence of thrombus. Normal compressibility.  Venous Reflux:  None.  Other Findings: No evidence of  superficial thrombophlebitis or abnormal fluid collection.  IMPRESSION: No evidence of deep venous  thrombosis in either lower extremity. Left common femoral vein stent is patent.  Electronically Signed   By: Marcey Moan M.D.   On: 08/30/2024 14:56 Note: Reviewed        Physical Exam  Vitals: BP (!) 142/83 (BP Location: Right Arm, Patient Position: Sitting, Cuff Size: Normal)   Pulse (!) 58   Temp (!) 97.2 F (36.2 C) (Temporal)   Resp 18   Ht 5' 9 (1.753 m)   Wt 237 lb (107.5 kg)   LMP  (LMP Unknown)   SpO2 96%   BMI 35.00 kg/m  BMI: Estimated body mass index is 35 kg/m as calculated from the following:   Height as of this encounter: 5' 9 (1.753 m).   Weight as of this encounter: 237 lb (107.5 kg). Ideal: Ideal body weight: 66.2 kg (145 lb 15.1 oz) Adjusted ideal body weight: 82.7 kg (182 lb 5.9 oz) General appearance: Well nourished, well developed, and well hydrated. In no apparent acute distress Mental status: Alert, oriented x 3 (person, place, & time)       Respiratory: No evidence of acute respiratory distress Eyes: PERLA  Musculoskeletal: +LBP Assessment   Diagnosis Status  1. Chronic pain syndrome   2. Medication management   3. Diabetic peripheral neuropathy (HCC)   4. Chronic musculoskeletal pain   5. Failed back surgical syndrome    Controlled Controlled Controlled   Updated Problems: No problems updated.  Plan of Care  Problem-specific:  Assessment and Plan    Chronic pain syndrome: Chronic low back pain radiating to the left leg and foot, likely due to sciatic nerve involvement. Managed with Norco 10-325 mg without side effects. - Continue Norco 10-325 mg for pain management. Patient's pain is controlled with hydrocodone , will continue on current medication regimen.  Prescribing drug monitoring (PDMP) reviewed, findings consistent with the use of prescribed medication and no evidence of narcotic misuse or abuse.  Urine drug screening up to date.  No side effects or any adverse reaction reported to medication.  Schedule follow-up in 90 days  for medication management   Left knee osteoarthritis (planned for left total knee replacement) Chronic left knee pain due to osteoarthritis with bone-on-bone contact. Surgery rescheduled to December 16th due to blood thinner management. - Proceed with left total knee replacement surgery on December 16th.  Type 2 diabetes mellitus with diabetic polyneuropathy Diabetic polyneuropathy more pronounced on the left side. Managed with Lyrica  prescribed by primary care provider. - Continue Lyrica  for neuropathic pain management. - Will discuss potential for Qutenza  treatment post-knee surgery if needed.       [name] has a current medication list which includes the following long-term medication(s): amphetamine -dextroamphetamine , amphetamine -dextroamphetamine , amphetamine -dextroamphetamine , atorvastatin , cetirizine , dabigatran, fluoxetine , levothyroxine , linzess , losartan , mirabegron  er, pantoprazole , potassium chloride , pregabalin , ropinirole , and trazodone .  Pharmacotherapy (Medications Ordered): Meds ordered this encounter  Medications   HYDROcodone -acetaminophen  (NORCO) 10-325 MG tablet    Sig: Take 1 tablet by mouth every 6 (six) hours as needed. To last 30 days from fill date    Dispense:  120 tablet    Refill:  0   HYDROcodone -acetaminophen  (NORCO) 10-325 MG tablet    Sig: Take 1 tablet by mouth every 6 (six) hours as needed. To last 30 days from fill date    Dispense:  120 tablet    Refill:  0   HYDROcodone -acetaminophen  (NORCO) 10-325 MG tablet    Sig: Take  1 tablet by mouth every 6 (six) hours as needed. To last 30 days from fill date    Dispense:  120 tablet    Refill:  0   Orders:  No orders of the defined types were placed in this encounter.       Return in about 3 months (around 12/17/2024) for (F2F), (MM), [name] Blanch NP.    Recent Visits No visits were found meeting these conditions. Showing recent visits within past 90 days and meeting all other  requirements Today's Visits Date Type Provider Dept  09/16/24 Office Visit Ordell Prichett K, NP [facility]  Showing today's visits and meeting all other requirements Future Appointments Date Type Provider Dept  12/14/24 Appointment Brenen Beigel K, NP [facility]  Showing future appointments within next 90 days and meeting all other requirements  I discussed the assessment and treatment plan with the patient. The patient was provided an opportunity to ask questions and all were answered. The patient agreed with the plan and demonstrated an understanding of the instructions.  Patient advised to call back or seek an in-person evaluation if the symptoms or condition worsens.  I personally spent a total of 30 minutes in the care of the patient today including preparing to see the patient, getting/reviewing separately obtained history, performing a medically appropriate exam/evaluation, counseling and educating, placing orders, referring and communicating with other health care professionals, documenting clinical information in the EHR, independently interpreting results, communicating results, and coordinating care.   Note by: Arin Vanosdol K Gweneth Fredlund, NP (TTS and AI technology used. I apologize for any typographical errors that were not detected and corrected.) Date: 09/16/2024; Time: 11:00 AM

## 2024-09-16 NOTE — Progress Notes (Signed)
 Nursing Pain Medication Assessment:  Safety precautions to be maintained throughout the outpatient stay will include: orient to surroundings, keep bed in low position, maintain call bell within reach at all times, provide assistance with transfer out of bed and ambulation.  Medication Inspection Compliance: Pill count conducted under aseptic conditions, in front of the patient. Neither the pills nor the bottle was removed from the patient's sight at any time. Once count was completed pills were immediately returned to the patient in their original bottle.  Medication: Hydrocodone /APAP Pill/Patch Count: 13 of 120 pills/patches remain Pill/Patch Appearance: Markings consistent with prescribed medication Bottle Appearance: Standard pharmacy container. Clearly labeled. Filled Date: 64 / 17 / 2025 Last Medication intake:  Today

## 2024-09-20 ENCOUNTER — Encounter: Payer: Self-pay | Admitting: Family Medicine

## 2024-09-20 ENCOUNTER — Ambulatory Visit: Payer: Self-pay

## 2024-09-20 ENCOUNTER — Ambulatory Visit (INDEPENDENT_AMBULATORY_CARE_PROVIDER_SITE_OTHER): Payer: MEDICAID | Admitting: Family Medicine

## 2024-09-20 VITALS — BP 104/62 | HR 62 | Ht 69.0 in | Wt 242.0 lb

## 2024-09-20 DIAGNOSIS — R300 Dysuria: Secondary | ICD-10-CM

## 2024-09-20 DIAGNOSIS — A084 Viral intestinal infection, unspecified: Secondary | ICD-10-CM | POA: Diagnosis not present

## 2024-09-20 DIAGNOSIS — R42 Dizziness and giddiness: Secondary | ICD-10-CM

## 2024-09-20 LAB — POCT URINALYSIS DIPSTICK
Bilirubin, UA: NEGATIVE
Glucose, UA: NEGATIVE
Ketones, UA: NEGATIVE
Leukocytes, UA: NEGATIVE
Nitrite, UA: NEGATIVE
Protein, UA: POSITIVE — AB
Spec Grav, UA: 1.015 (ref 1.010–1.025)
Urobilinogen, UA: 0.2 U/dL
pH, UA: 6 (ref 5.0–8.0)

## 2024-09-20 MED ORDER — ONDANSETRON HCL 4 MG PO TABS: 4.0000 mg | ORAL_TABLET | Freq: Three times a day (TID) | ORAL | 0 refills | Status: AC | PRN

## 2024-09-20 NOTE — Telephone Encounter (Signed)
 FYI Only or Action Required?: FYI only for provider: appointment scheduled on 09/20/2024.  Patient was last seen in primary care on 08/27/2024 by Franchot Isaiah LABOR, MD.  Called Nurse Triage reporting Diarrhea.  Symptoms began Wednesday.  Interventions attempted: Nothing.  Symptoms are: gradually worsening.  Triage Disposition: See Physician Within 24 Hours  Patient/caregiver understands and will follow disposition?: Yes    Copied from CRM #[id number]. Topic: Clinical - Red Word Triage >> Sep 20, 2024  7:56 AM Delon HERO wrote: Red Word that prompted transfer to Nurse Triage: Patient is calling to report dizziness since Friday and off balance. And diarrhea starting Thursday. Reason for Disposition  [1] SEVERE diarrhea (e.g., 7 or more times / day more than normal) AND [2] present > 24 hours (1 day)  Answer Assessment - Initial Assessment Questions 1. DIARRHEA SEVERITY: How bad is the diarrhea? How many more stools have you had in the past 24 hours than normal?      diarrhea 2. ONSET: When did the diarrhea begin?      Wednesday 3. STOOL DESCRIPTION:  How loose or watery is the diarrhea? What is the stool color? Is there any blood or mucous in the stool? At first Hard balls, then diarrhea - Watery, loose stools 4. VOMITING: Are you also vomiting? If Yes, ask: How many times in the past 24 hours?      no 5. ABDOMEN PAIN: Are you having any abdomen pain? If Yes, ask: What does it feel like? (e.g., crampy, dull, intermittent, constant)      yes 6. ABDOMEN PAIN SEVERITY: If present, ask: How bad is the pain?  (e.g., Scale 1-10; mild, moderate, or severe)     6/10 7. ORAL INTAKE: If vomiting, Have you been able to drink liquids? How much liquids have you had in the past 24 hours?     Not really 8. HYDRATION: Any signs of dehydration? (e.g., dry mouth [not just dry lips], too weak to stand, dizziness, new weight loss) When did you last urinate?      dizziness 9. EXPOSURE: Have you traveled to a foreign country recently? Have you been exposed to anyone with diarrhea? Could you have eaten any food that was spoiled?     no 10. ANTIBIOTIC USE: Are you taking antibiotics now or have you taken antibiotics in the past 2 months?       no 11. OTHER SYMPTOMS: Do you have any other symptoms? (e.g., fever, blood in stool)       Nausea, dizziness, lightheadedness, faint, unsteady legs, 12. PREGNANCY: Is there any chance you are pregnant? When was your last menstrual period?       Na Diarrhea started on Wednesday - pt stated not drinking a lot of water or other fluids due to nausea without vomiting. Pt c/o abd pain with diarrhea.  Pt c/o lightheadedness, dizziness at times  Protocols used: [facility]

## 2024-09-20 NOTE — Progress Notes (Signed)
 Acute visit   Patient: Jane [name]   DOB: February 06, 1966   58 y.o. Female  MRN: [id number] PCP: Myrla Jon HERO, MD   Chief Complaint  Patient presents with   Diarrhea    Ongoing 5 days  Has not ate anything out of ordinary, less of appetite. When she does eat has a stomach upset no otc medication.   Dizziness    Ongoing 3 day worse when standing, walking, leaning    Subjective    Discussed the use of AI scribe software for clinical note transcription with the patient, who gave verbal consent to proceed.  History of Present Illness   Jane [name] is a 58 year old female who presents with five days of diarrhea and abdominal pain.  Diarrhea has persisted for five days, beginning with constipation and progressing to liquid stools, with no blood present. There is an inability to control bowel movements. Abdominal pain is constant and diffuse, with tenderness in the lower abdomen and rib area. Nausea is significant, particularly in the upper abdomen, but there is no vomiting. She feels cold, likely due to dehydration, and experiences lightheadedness and dizziness. She is able to keep liquids down but has a decreased desire to drink. Her mouth is very dry, and her buttocks are irritated from frequent bowel movements.  She is on a blood thinner and Ozempic , which usually causes constipation. No new medications have been introduced. She has heart disease and her blood pressure is low normal. No fever is present.       Review of Systems  Objective    BP 104/62   Pulse 62   Ht 5' 9 (1.753 m)   Wt 242 lb (109.8 kg)   LMP  (LMP Unknown)   SpO2 99%   BMI 35.74 kg/m   Orthostatic Vitals for the past 48 hrs (Last 6 readings):  Orthostatic BP Orthostatic Pulse BP Pulse  09/20/24 1550 -- -- 104/62 62  09/20/24 1603 120/70 64 -- --  09/20/24 1604 118/72 68 -- --  09/20/24 1605 124/70 62 -- --    Physical Exam Vitals reviewed.  Constitutional:      General: She is not in  acute distress.    Appearance: Normal appearance. She is well-developed. She is not diaphoretic.  HENT:     Head: Normocephalic and atraumatic.  Eyes:     General: No scleral icterus.    Conjunctiva/sclera: Conjunctivae normal.  Neck:     Thyroid : No thyromegaly.  Cardiovascular:     Rate and Rhythm: Normal rate and regular rhythm.     Heart sounds: Normal heart sounds. No murmur heard. Pulmonary:     Effort: Pulmonary effort is normal. No respiratory distress.     Breath sounds: Normal breath sounds. No wheezing, rhonchi or rales.  Abdominal:     General: There is no distension.     Palpations: Abdomen is soft.     Tenderness: There is abdominal tenderness (diffusely). There is no guarding or rebound.  Musculoskeletal:     Cervical back: Neck supple.     Right lower leg: No edema.     Left lower leg: No edema.  Lymphadenopathy:     Cervical: No cervical adenopathy.  Skin:    General: Skin is warm and dry.     Findings: No rash.  Neurological:     Mental Status: She is alert. Mental status is at baseline.  Psychiatric:        Mood and Affect:  Mood normal.        Behavior: Behavior normal.       Results for orders placed or performed in visit on 09/20/24  POCT Urinalysis Dipstick  Result Value Ref Range   Color, UA Yellow    Clarity, UA Clear    Glucose, UA Negative Negative   Bilirubin, UA Negative    Ketones, UA Negative    Spec Grav, UA 1.015 1.010 - 1.025   Blood, UA trace    pH, UA 6.0 5.0 - 8.0   Protein, UA Positive (A) Negative   Urobilinogen, UA 0.2 0.2 or 1.0 E.U./dL   Nitrite, UA Negative    Leukocytes, UA Negative Negative   Appearance     Odor      Assessment & Plan     Problem List Items Addressed This Visit   None Visit Diagnoses       Dysuria    -  Primary   Relevant Orders   POCT Urinalysis Dipstick (Completed)     Viral gastroenteritis         Lightheadedness              Acute viral gastroenteritis with dehydration Acute viral  gastroenteritis with symptoms of watery diarrhea, nausea, and abdominal pain for five days. No fever or vomiting. Likely viral etiology given current viral gastroenteritis outbreak. Dehydration indicated by lightheadedness, dizziness, and dry mouth. No orthostatic hypotension. Differential includes bacterial causes if symptoms persist beyond a week. - Administered Zofran  for nausea to improve oral intake and potentially slow diarrhea. - Encouraged oral hydration with water and electrolyte-rich fluids like Gatorade or Pedialyte. - Advised rest and sleep to aid recovery. - If diarrhea persists beyond a week, will consider stool studies for bacterial causes. - If unable to tolerate oral hydration, will consider ER visit for IV fluids.  Suspected urinary tract infection Due to symptoms of abdominal pain and diarrhea. Urine dipstick test planned to confirm diagnosis. Antibiotics will be considered if UTI is confirmed, but avoided if not necessary to prevent worsening diarrhea. - Obtained urine sample for dipstick test to check for UTI. - If UTI is confirmed, will consider antibiotics, but will avoid if not necessary to prevent worsening diarrhea.      UA clear - suspect dehydration is causing sensation of dysuria   Meds ordered this encounter  Medications   ondansetron  (ZOFRAN ) 4 MG tablet    Sig: Take 1 tablet (4 mg total) by mouth every 8 (eight) hours as needed for nausea or vomiting.    Dispense:  20 tablet    Refill:  0     Return if symptoms worsen or fail to improve.      Jon Eva, MD  [facility] [phone] (phone) [phone] (fax)  [facility]

## 2024-09-20 NOTE — Telephone Encounter (Signed)
 Noted

## 2024-09-23 ENCOUNTER — Other Ambulatory Visit (HOSPITAL_COMMUNITY): Payer: Self-pay | Admitting: Psychiatry

## 2024-09-23 ENCOUNTER — Other Ambulatory Visit: Payer: Self-pay

## 2024-09-23 MED ORDER — DABIGATRAN ETEXILATE MESYLATE 150 MG PO CAPS: 150.0000 mg | ORAL_CAPSULE | Freq: Two times a day (BID) | ORAL | 0 refills | Status: DC

## 2024-09-23 NOTE — Telephone Encounter (Signed)
 MyChart message sent to patient informing her of future refills need come from PCP

## 2024-09-23 NOTE — Telephone Encounter (Signed)
 Patient states she has been out of medication since 09/16/24

## 2024-09-24 ENCOUNTER — Other Ambulatory Visit: Payer: Self-pay | Admitting: Family Medicine

## 2024-10-04 ENCOUNTER — Other Ambulatory Visit (INDEPENDENT_AMBULATORY_CARE_PROVIDER_SITE_OTHER): Payer: Self-pay | Admitting: Family Medicine

## 2024-10-04 DIAGNOSIS — E1165 Type 2 diabetes mellitus with hyperglycemia: Secondary | ICD-10-CM

## 2024-10-05 ENCOUNTER — Ambulatory Visit (INDEPENDENT_AMBULATORY_CARE_PROVIDER_SITE_OTHER): Payer: MEDICAID | Admitting: Family Medicine

## 2024-10-06 ENCOUNTER — Encounter (INDEPENDENT_AMBULATORY_CARE_PROVIDER_SITE_OTHER): Payer: Self-pay | Admitting: Family Medicine

## 2024-10-08 ENCOUNTER — Telehealth: Payer: Self-pay | Admitting: Family Medicine

## 2024-10-08 ENCOUNTER — Other Ambulatory Visit: Payer: Self-pay

## 2024-10-08 DIAGNOSIS — K5909 Other constipation: Secondary | ICD-10-CM

## 2024-10-08 NOTE — Telephone Encounter (Signed)
 [facility] faxed refill request for the following medications:  polyethylene glycol powder (GLYCOLAX /MIRALAX ) 17 GM/SCOOP powder     Please advise.

## 2024-10-11 ENCOUNTER — Telehealth: Payer: Self-pay

## 2024-10-11 ENCOUNTER — Ambulatory Visit: Payer: Self-pay | Admitting: Urology

## 2024-10-11 MED ORDER — POLYETHYLENE GLYCOL 3350 17 GM/SCOOP PO POWD: 17.0000 g | Freq: Every day | ORAL | 1 refills | Status: AC

## 2024-10-11 MED ORDER — ENOXAPARIN SODIUM 100 MG/ML IJ SOSY: 100.0000 mg | PREFILLED_SYRINGE | Freq: Two times a day (BID) | INTRAMUSCULAR | 0 refills | Status: DC

## 2024-10-11 NOTE — Telephone Encounter (Signed)
 Hey Dr Jacobo, Is she seeing you for her anti-coag now?  Do you want me to do her lovenox  bridge for surgery or will your office take care of that?  Thanks!

## 2024-10-11 NOTE — Telephone Encounter (Signed)
 Not a patient of [facility]. Pt sees Bacigalupo, Jon HERO, MD. Thank you.   Copied from CRM [id number]. Topic: General - Call Back - No Documentation >> Oct 11, 2024 12:21 PM Charlet HERO wrote: Reason for CRM: Patient is returning call to Elana please czll the patient back with further asst.

## 2024-10-11 NOTE — Telephone Encounter (Signed)
 Can you do her bridge please   Copied from CRM [id number]. Topic: Clinical - Prescription Issue >> Oct 11, 2024  9:05 AM Wess RAMAN wrote: Reason for CRM: Patient is having surgery on 10/19/24 and needs new blood thinner injection medication sent to pharmacy. She has to start the medication tomorrow.  Callback #: [id number]  Pharmacy: [facility] [address] - [place], [place] - [address] [facility] [address] [facility] [address] [place] [address] Phone: [phone] Fax: [phone] Hours: Not open 24 hours

## 2024-10-11 NOTE — Telephone Encounter (Signed)
 Patient advised verbally and then had message sent through mychart

## 2024-10-15 ENCOUNTER — Other Ambulatory Visit: Payer: Self-pay | Admitting: Family Medicine

## 2024-10-15 ENCOUNTER — Ambulatory Visit: Payer: Self-pay

## 2024-10-15 MED ORDER — HYDROXYZINE PAMOATE 25 MG PO CAPS: 25.0000 mg | ORAL_CAPSULE | Freq: Three times a day (TID) | ORAL | 0 refills | Status: AC | PRN

## 2024-10-15 NOTE — Telephone Encounter (Signed)
 Please pre-treat with benadryl 50mg  and cetirizine  10mg  before injection   Will send hydroxyzine 25mg  twice daily as needed for itching

## 2024-10-15 NOTE — Telephone Encounter (Signed)
 FYI Only or Action Required?: Action required by provider: clinical question for provider and update on patient condition.  Patient was last seen in primary care on 09/20/2024 by Myrla Jon HERO, MD.  Called Nurse Triage reporting Allergic Reaction.  Symptoms began yesterday.  Interventions attempted: Nothing.  Symptoms are: gradually worsening.  Triage Disposition: See Physician Within 24 Hours  Patient/caregiver understands and will follow disposition?: Yes   Copied from CRM #[id number]. Topic: Clinical - Red Word Triage >> Oct 15, 2024 10:40 AM Tonda B wrote: [facility] that prompted transfer to Nurse Triage: patient was prescribed rx  enoxaparin  (LOVENOX ) 100 MG/ML injection and is having an allergic reaction all over her body Reason for Disposition  Hives or itching  Answer Assessment - Initial Assessment Questions Patient of Dr. Myrla  Patient currently on lovenox  bridge for upcoming surgery next week. Began Lovenox  on Wednesday, 12/10, developed itchy redness on 12/11. Worsened today, 12/12  Patient needs to know if she should continue Lovenox  or not in preparation for surgery on 10/20/2024  Next dose of lovenox  due at 7pm  Called [facility] and spoke to [place] who will give message to provider in clinic today  1. APPEARANCE of RASH: What does the rash look like? (e.g., spots, blisters, raised areas, skin peeling, scaly)   itchy  Redness to neck and stomach  3. LOCATION: Where is the rash located?     Scalp, neck, shoulders, stomach 4. COLOR: What color is the rash? (Note: It is difficult to assess rash color in people with darker-colored skin. When this situation occurs, simply ask the caller to describe what they see.)     red 5. ONSET: When did the rash begin?     One day ago 6. FEVER: Do you have a fever? If Yes, ask: What is your temperature, how was it measured, and when did it start?     denies 7. ITCHING: Does the rash itch? If Yes, ask:  How bad is the itch? (Scale 1-10; or mild, moderate, severe)     8/10 8. CAUSE: What do you think is causing the rash?     Lovenox  injections 9. NEW MEDICINES: What new medicines are you taking? (e.g., name of antibiotic) When did you start taking this medication?.     lovenox  10. OTHER SYMPTOMS: Do you have any other symptoms? (e.g., sore throat, fever, joint pain)       denies  Protocols used: Rash - Widespread On Drugs-A-AH

## 2024-10-18 ENCOUNTER — Ambulatory Visit: Payer: MEDICAID | Admitting: Urology

## 2024-10-18 VITALS — BP 145/84 | HR 69 | Ht 69.0 in | Wt 240.0 lb

## 2024-10-18 DIAGNOSIS — N3946 Mixed incontinence: Secondary | ICD-10-CM | POA: Diagnosis not present

## 2024-10-18 MED ORDER — NITROFURANTOIN MACROCRYSTAL 100 MG PO CAPS: 100.0000 mg | ORAL_CAPSULE | Freq: Every day | ORAL | 11 refills | Status: AC

## 2024-10-18 NOTE — Progress Notes (Signed)
 10/18/2024 10:04 AM   Jane [name] August 05, 1966 [id number]  Referring provider: Myrla Jon HERO, MD [address] [place],  [place] [address]  Chief Complaint  Patient presents with   Over Active Bladder   Follow-up    HPI: Br: Chronic renal insufficiency overactive bladder and urinary tract infections.  Was on Gemtesa  and vaginal estrogen cream.  Cystoscopy November 2022.  Has had improvement on Myrbetriq  in the past.  Recently saw a nurse practitioner on Gemtesa .  Having ongoing incontinence.  In May 2024 patient had a UTI with a positive culture.  He is on CPAP.  She was doing well on the Gemtesa  but the effect may have worn off.  Has had a normal CT scan.  Was worked up for microscopic hematuria in the past.  Was started on trimethoprim  hoping it would down regulate her incontinence.  When she saw a nurse practitioner in June she had worsening incontinence and a urinary tract infection and the culture was positive   Patient thinks she may have a bladder infection now with burning.  She said when she gets infected she has frequency urge incontinence and worsening nocturia.  She says when she is not infected she can void every 2 hours and get up once a night and that she is continent.  She repeated that when she is not infected she does not think she leaks.   Never filled trimethoprim  due to lack of approval.  I do not think she could afford the Gemtesa  but she said it works very well when she was not infected.  Using estrogen twice a week.   Still has right sided flank discomfort that comes and goes been discussed before  Patient understands that we want to control UTIs first.  Reassess in 3 months and daily Macrodantin  100 mg 30 x 11.  Flank discomfort not due to kidneys.  Reassess incontinence at that time.    Last culture positive.  No infections.  She is continent.  Very pleased.  Macrodantin  90 x 3 sent to pharmacy and see in 1 year.  In 1 year she might want to try  to stop it and we discussed this.  Proceed accordingly   Patient saw physician extender in May 2025 and was put on Myrbetriq .  She had side effects from oxybutynin  and it failed Gemtesa  due to cost.  No urine culture recently.  She was cleared in 2022 for having microscopic hematuria and it had cystoscopy and a CT scan with contrast in 2022 and also a CT stone protocol in 2024.  She also had a CT scan with contrast in 2023   Currently she is infection free on daily Macrodantin  and does not report UTIs.  Myrbetriq  failed.  She has urge incontinence.  She leaks with coughing sneezing.  She wears 2-3 pads during the day moderately wet.  She wears 5 pads a day for high-volume bedwetting.  She voids every hour cannot hold it for 2 hours.  She gets up 5-6 times a night and does have some ankle edema.  She does not take a diuretic   Patient has mixed incontinence and high-volume urge incontinence and bedwetting.  She has urinary frequency.  She has been cleared for microscopic hematuria.  She will return with urodynamics.  In the meantime I offered Vesicare .  Even though she is a non-smoker when she comes back after her urodynamics I am going to repeat her pelvic examination and repeat cystoscopy.  I called in  Vesicare  5 mg 30 x 11.  We will then discuss refractory therapies pending results.  She did say that her incontinence had worsened a lot in the last few months   Today Frequency stable.  Incontinence stable.  Last culture negative. By history I believe the patient is still on Macrodantin .  She is on Vesicare  with mild benefit but still leaking a lot. On pelvic examination she had mild hypermobility the bladder neck and negative cough test before and after cystoscopy with a moderate cough Patient underwent flexible cystoscopy.  She had very cloudy urine.  A lot of white flecks.  Mild bladder erythema.  I carefully looked throughout the entire bladder and no carcinoma.  She has never smoked.  Trigone  normal. In retrospect patient thinks he is going more frequently with a little bit of burning. Urinalysis today looked infected. Patient did not have urodynamics because insurance did not cover it    Patient has mixed incontinence but high-volume urge incontinence and bedwetting.  She did not have urodynamics but clinically she primarily has refractory overactive bladder.  I discussed refractory treatments with her.  I thought it was best to give her ciprofloxacin  2 and 50 mg twice a day and go back on daily Macrodantin  and call if the culture is positive.  If she gets another breakthrough infection I will switch her prophylaxis.  I am not sure why her insurance would not cover trimethoprim  in the past.   Patient no longer on CPAP.  She does take blood thinners.  She would like to do percutaneous tibial nerve stimulation.  She may or may not consider Botox or InterStim in the future.  Full templates used.  Handout given.  Proceed accordingly  October 18, 2024 When I saw the patient in October her culture was positive.  For unknown reasons patient is not taking daily Macrodantin .  She is on Myrbetriq  and Vesicare  still having her incontinence.  I am not convinced she is clinically infected but her urine looked infected today.  She was to have percutaneous tibial nerve stimulation and [place] with extender but the office is closed   PMH: Past Medical History:  Diagnosis Date   ADD (attention deficit disorder)    Allergy    Anxiety    Arthritis 2021   Back pain    Bilateral swelling of feet    Bipolar 1 disorder (HCC)    Bipolar 1 disorder (HCC)    Bipolar disorder (HCC)    Blood transfusion without reported diagnosis 2020   Chewing difficulty    Chronic fatigue syndrome    Chronic kidney disease    Stage 3 kidney disease;dx by Dr. Dallas Gelineau.    Clotting disorder    I'm on a blood  thinner   Constipation    Depression 2000   Diabetes mellitus without complication (HCC)    diet  controlled   Dyspnea    with exertion   GERD (gastroesophageal reflux disease)    Headache    migraines   High cholesterol    History of blood clots    History of DVT (deep vein thrombosis)    left leg   Hypertension    states under control with meds., has been on med. x 2 years   Hypothyroidism    Joint pain    Neuromuscular disorder (HCC)    Neuropathy    Obsessive-compulsive disorder    Peripheral vascular disease    Prediabetes    Respiratory failure requiring intubation (HCC)  Restless leg syndrome    Shortness of breath    Sleep apnea    Thrombocytopenia 09/15/2019   Trigger thumb of left hand 01/2018   Trigger thumb of right hand     Surgical History: Past Surgical History:  Procedure Laterality Date   ABDOMINAL HYSTERECTOMY  06/2016   complete   APPLICATION OF WOUND VAC Left 04/27/2019   Procedure: APPLICATION OF WOUND VAC LEFT GROIN;  Surgeon: Sheree Penne Bruckner, MD;  Location[facility] OR;  Service: Vascular;  Laterality: Left;   AV FISTULA PLACEMENT Left 11/24/2018   Procedure: ARTERIOVENOUS (AV) FISTULA CREATION LEFT SFA TO LEFT FEMORAL VEIN;  Surgeon: Sheree Penne Bruckner, MD;  Location: [facility] OR;  Service: Vascular;  Laterality: Left;   BACK SURGERY     CHOLECYSTECTOMY     COLONOSCOPY WITH PROPOFOL  N/A 11/22/2019   Procedure: COLONOSCOPY WITH PROPOFOL ;  Surgeon: Therisa Bi, MD;  Location: [facility] ENDOSCOPY;  Service: Gastroenterology;  Laterality: N/A;   COLONOSCOPY WITH PROPOFOL  N/A 12/09/2022   Procedure: COLONOSCOPY WITH PROPOFOL ;  Surgeon: Therisa Bi, MD;  Location: [facility] ENDOSCOPY;  Service: Gastroenterology;  Laterality: N/A;   FEMORAL ARTERY EXPLORATION  03/30/2019   Procedure: Left Common Femoral Artery and Vein Exploration;  Surgeon: Sheree Penne Bruckner, MD;  Location: [facility] OR;  Service: Vascular;;   FEMORAL-FEMORAL BYPASS GRAFT Left 11/24/2018   Procedure: BYPASS GRAFT FEMORAL-FEMORAL VENOUS LEFT TO RIGHT PALMA PROCEDURE USING CRYOVEIN;   Surgeon: Sheree Penne Bruckner, MD;  Location: [facility] OR;  Service: Vascular;  Laterality: Left;   GROIN DEBRIDEMENT Left 04/27/2019   Procedure: QUILLIAN DEBRIDEMENT;  Surgeon: Sheree Penne Bruckner, MD;  Location: [facility] OR;  Service: Vascular;  Laterality: Left;   INSERTION OF ILIAC STENT  03/30/2019   Procedure: Stent of left common, external iliac veins and left common femoral vein;  Surgeon: Sheree Penne Bruckner, MD;  Location: [facility] OR;  Service: Vascular;;   KNEE ARTHROSCOPY WITH MENISCAL REPAIR Left 11/14/2020   Procedure: LEFT KNEE ARTHROSCOPY WITH PARTIAL MEDIAL MENISCECTOMY;  Surgeon: Margrette Taft BRAVO, MD;  Location: [facility] ORS;  Service: Orthopedics;  Laterality: Left;   LOWER EXTREMITY INTERVENTION Left 04/05/2024   Procedure: LOWER EXTREMITY INTERVENTION;  Surgeon: Sheree Penne Bruckner, MD;  Location: [name] INVASIVE CV LAB;  Service: Cardiovascular;  Laterality: Left;   LOWER EXTREMITY VENOGRAPHY N/A 08/17/2018   Procedure: LOWER EXTREMITY VENOGRAPHY - Central Venogram;  Surgeon: Sheree Penne Bruckner, MD;  Location: [facility] INVASIVE CV LAB;  Service: Cardiovascular;  Laterality: N/A;   LOWER EXTREMITY VENOGRAPHY Bilateral 03/09/2019   Procedure: LOWER EXTREMITY VENOGRAPHY;  Surgeon: Sheree Penne Bruckner, MD;  Location: [facility] INVASIVE CV LAB;  Service: Cardiovascular;  Laterality: Bilateral;   LOWER EXTREMITY VENOGRAPHY Left 08/16/2019   Procedure: LOWER EXTREMITY VENOGRAPHY;  Surgeon: Sheree Penne Bruckner, MD;  Location: [facility] INVASIVE CV LAB;  Service: Cardiovascular;  Laterality: Left;   LOWER EXTREMITY VENOGRAPHY Left 03/25/2022   Procedure: LOWER EXTREMITY VENOGRAPHY;  Surgeon: Sheree Penne Bruckner, MD;  Location: [facility] INVASIVE CV LAB;  Service: Cardiovascular;  Laterality: Left;  IVUS   LOWER EXTREMITY VENOGRAPHY Left 04/05/2024   Procedure: LOWER EXTREMITY VENOGRAPHY;  Surgeon: Sheree Penne Bruckner, MD;  Location: [facility] INVASIVE CV LAB;  Service: Cardiovascular;   Laterality: Left;   LUMBAR FUSION  11/21/2000   L5-S1   LUMBAR SPINE SURGERY     x 2 others   PATCH ANGIOPLASTY Left 03/30/2019   Procedure: Patch Angioplasty of the Left Common Femoral Vein using Venosure Biologic patch;  Surgeon: Sheree Penne Bruckner, MD;  Location:  [facility] OR;  Service: Vascular;  Laterality: Left;   PERIPHERAL VASCULAR INTERVENTION Left 08/16/2019   Procedure: PERIPHERAL VASCULAR INTERVENTION;  Surgeon: Sheree Penne Bruckner, MD;  Location: [facility] INVASIVE CV LAB;  Service: Cardiovascular;  Laterality: Left;  common femoral/femoral vein stent   PERIPHERAL VASCULAR INTERVENTION Left 03/25/2022   Procedure: PERIPHERAL VASCULAR INTERVENTION;  Surgeon: Sheree Penne Bruckner, MD;  Location: [facility] INVASIVE CV LAB;  Service: Cardiovascular;  Laterality: Left;  COMMON FEMORAL VEIN   PERIPHERAL VASCULAR THROMBECTOMY Left 03/25/2022   Procedure: PERIPHERAL VASCULAR THROMBECTOMY;  Surgeon: Sheree Penne Bruckner, MD;  Location: [facility] INVASIVE CV LAB;  Service: Cardiovascular;  Laterality: Left;  EXTREMITY/IVC   PERIPHERAL VASCULAR THROMBECTOMY Left 04/05/2024   Procedure: PERIPHERAL VASCULAR THROMBECTOMY;  Surgeon: Sheree Penne Bruckner, MD;  Location: [facility] INVASIVE CV LAB;  Service: Cardiovascular;  Laterality: Left;   PERIPHERAL VASCULAR ULTRASOUND/IVUS Left 04/05/2024   Procedure: Peripheral Vascular Ultrasound/IVUS;  Surgeon: Sheree Penne Bruckner, MD;  Location: [facility] INVASIVE CV LAB;  Service: Cardiovascular;  Laterality: Left;   SPINE SURGERY     TRIGGER FINGER RELEASE Right 12/01/2017   Procedure: RELEASE TRIGGER FINGER/A-1 PULLEY RIGHT THUMB;  Surgeon: Murrell Drivers, MD;  Location: [facility];  Service: Orthopedics;  Laterality: Right;   TRIGGER FINGER RELEASE Left 01/26/2018   Procedure: LEFT TRIGGER THUMB RELEASE;  Surgeon: Murrell Drivers, MD;  Location: [facility];  Service: Orthopedics;  Laterality: Left;   ULTRASOUND GUIDANCE FOR VASCULAR  ACCESS Right 03/30/2019   Procedure: Ultrasound-guided cannulation right internal jugular vein;  Surgeon: Sheree Penne Bruckner, MD;  Location: [facility] OR;  Service: Vascular;  Laterality: Right;   VSD REPAIR      Home Medications:  Allergies as of 10/18/2024       Reactions   Aripiprazole Other (See Comments)   BECOMES  VIOLENT   Seroquel  [quetiapine  Fumarate] Other (See Comments)   BECOMES VIOLENT   Chlorpromazine  Other (See Comments)   SEVERE ANXIETY   Gabapentin  Other (See Comments)   NIGHTMARES   Prednisone  Hives        Medication List        Accurate as of October 18, 2024 10:04 AM. If you have any questions, ask your nurse or doctor.          rOPINIRole  2 MG tablet Commonly known as: REQUIP  TAKE 1 TABLET BY MOUTH TWICE DAILY The timing of this medication is very important.   Ajovy 225 MG/1.5ML Soaj Generic drug: Fremanezumab-vfrm Inject 1 mL into the skin every 30 (thirty) days.   ALPRAZolam  1 MG tablet Commonly known as: XANAX  Take 1 tablet (1 mg total) by mouth 2 (two) times daily as needed. for anxiety   amphetamine -dextroamphetamine  30 MG tablet Commonly known as: ADDERALL Take 1 tablet by mouth 2 (two) times daily.   amphetamine -dextroamphetamine  30 MG tablet Commonly known as: Adderall Take 1 tablet by mouth 2 (two) times daily.   amphetamine -dextroamphetamine  30 MG tablet Commonly known as: Adderall Take 1 tablet by mouth 2 (two) times daily.   aspirin  EC 81 MG tablet Take 81 mg by mouth daily.   atorvastatin  40 MG tablet Commonly known as: LIPITOR TAKE 1 TABLET BY MOUTH DAILY   cetirizine  10 MG tablet Commonly known as: ZYRTEC  TAKE 1 TABLET BY MOUTH DAILY   ciprofloxacin  250 MG tablet Commonly known as: Cipro  Take 1 tablet (250 mg total) by mouth 2 (two) times daily.   cromolyn 4 % ophthalmic solution Commonly known as: OPTICROM Place 1 drop into both eyes  2 (two) times daily.   cyclobenzaprine  5 MG tablet Commonly known as:  FLEXERIL  Take 1 tablet (5 mg total) by mouth 3 (three) times daily as needed for muscle spasms.   dabigatran  150 MG Caps capsule Commonly known as: Pradaxa  Take 1 capsule (150 mg total) by mouth 2 (two) times daily.   enoxaparin  100 MG/ML injection Commonly known as: Lovenox  Inject 1 mL (100 mg total) into the skin every 12 (twelve) hours for 7 days.   famotidine  20 MG tablet Commonly known as: PEPCID  Take 20 mg by mouth daily.   FLUoxetine  40 MG capsule Commonly known as: PROZAC  Take 1 capsule (40 mg total) by mouth 2 (two) times daily.   HYDROcodone -acetaminophen  10-325 MG tablet Commonly known as: NORCO Take 1 tablet by mouth every 6 (six) hours as needed. To last 30 days from fill date   HYDROcodone -acetaminophen  10-325 MG tablet Commonly known as: NORCO Take 1 tablet by mouth every 6 (six) hours as needed. To last 30 days from fill date Start taking on: October 19, 2024   HYDROcodone -acetaminophen  10-325 MG tablet Commonly known as: NORCO Take 1 tablet by mouth every 6 (six) hours as needed. To last 30 days from fill date Start taking on: November 18, 2024   hydrOXYzine  25 MG capsule Commonly known as: VISTARIL  Take 1 capsule (25 mg total) by mouth every 8 (eight) hours as needed for itching.   levothyroxine  125 MCG tablet Commonly known as: SYNTHROID  TAKE 1 TABLET BY MOUTH DAILY BEFORE BREAKFAST   Linzess  290 MCG Caps capsule Generic drug: linaclotide  TAKE ONE CAPSULE BY MOUTH DAILY   losartan  25 MG tablet Commonly known as: COZAAR  TAKE 1 TABLET BY MOUTH DAILY   mirabegron  ER 50 MG Tb24 tablet Commonly known as: MYRBETRIQ  Take 1 tablet (50 mg total) by mouth daily.   multivitamin capsule Take 1 capsule by mouth daily.   naloxone  4 MG/0.1ML Liqd nasal spray kit Commonly known as: NARCAN  Place 1 spray into the nose as needed for up to 365 doses (for opioid-induced respiratory depresssion). In case of emergency (overdose), spray once into each nostril. If  no response within 3 minutes, repeat application and call 911.   nitrofurantoin  100 MG capsule Commonly known as: Macrodantin  Take 1 capsule (100 mg total) by mouth daily.   OMEGA 3 PO Take 1 capsule by mouth daily.   ondansetron  4 MG tablet Commonly known as: Zofran  Take 1 tablet (4 mg total) by mouth every 8 (eight) hours as needed for nausea or vomiting.   pantoprazole  40 MG tablet Commonly known as: PROTONIX  TAKE 1 TABLET BY MOUTH DAILY   pimecrolimus  1 % cream Commonly known as: ELIDEL  Apply to affected areas rash in body folds and legs once to twice daily until improved.   polyethylene glycol powder 17 GM/SCOOP powder Commonly known as: GLYCOLAX /MIRALAX  Take 17 g by mouth daily. Dissolve 1 capful (17g) in 4-8 ounces of liquid and take by mouth daily.   potassium chloride  10 MEQ tablet Commonly known as: KLOR-CON  M Take 1 tablet (10 mEq total) by mouth 3 (three) times daily.   pregabalin  100 MG capsule Commonly known as: LYRICA  TAKE ONE CAPSULE BY MOUTH FOUR TIMES DAILY   Restasis  0.05 % ophthalmic emulsion Generic drug: cycloSPORINE  Place 1 drop into both eyes 2 (two) times daily.   Semaglutide  (1 MG/DOSE) 4 MG/3ML Sopn Inject 1 mg as directed once a week.   solifenacin  5 MG tablet Commonly known as: VESICARE  Take 1 tablet (5 mg total)  by mouth daily.   traZODone  150 MG tablet Commonly known as: DESYREL  Take 1 tablet (150 mg total) by mouth at bedtime.   triamcinolone  ointment 0.5 % Commonly known as: KENALOG  Apply 1 Application topically 2 (two) times daily.   Ubrelvy  100 MG Tabs Generic drug: Ubrogepant  Take 100 mg by mouth daily as needed (migraines). May take a second 100 mg dose 2 hours later as needed for migraines        Allergies: Allergies[1]  Family History: Family History  Problem Relation Age of Onset   Heart disease Mother    Hyperlipidemia Mother    Hypertension Mother    Bipolar disorder Mother    Stroke Mother    Depression  Mother    Sleep apnea Mother    Obesity Mother    Diabetes Father    Heart disease Father    Hyperlipidemia Father    Hypertension Father    Sleep apnea Father    Obesity Father    Drug abuse Daughter    ADD / ADHD Daughter    Drug abuse Daughter    Anxiety disorder Daughter    Bipolar disorder Daughter    Hypertension Sister    Hypertension Brother    Hyperlipidemia Brother    Heart disease Brother    Early death Brother    Kidney disease Brother    Bipolar disorder Maternal Aunt    Suicidality Maternal Aunt     Social History:  reports that she has never smoked. She has never used smokeless tobacco. She reports that she does not drink alcohol and does not use drugs.  ROS:                                        Physical Exam: BP (!) 145/84   Pulse 69   Ht 5' 9 (1.753 m)   Wt 108.9 kg   LMP  (LMP Unknown)   SpO2 94%   BMI 35.44 kg/m   Constitutional:  Alert and oriented, No acute distress. HEENT: [place] AT, moist mucus membranes.  Trachea midline, no masses.   Laboratory Data: Lab Results  Component Value Date   WBC 5.8 08/16/2024   HGB 12.9 08/16/2024   HCT 37.9 08/16/2024   MCV 94 08/16/2024   PLT 115 (L) 08/16/2024    Lab Results  Component Value Date   CREATININE 1.24 (H) 08/16/2024    No results found for: PSA  No results found for: TESTOSTERONE  Lab Results  Component Value Date   HGBA1C 5.7 (A) 06/01/2024    Urinalysis    Component Value Date/Time   COLORURINE YELLOW (A) 01/30/2023 1328   APPEARANCEUR Slightly cloudy 08/16/2024 0840   LABSPEC 1.010 01/30/2023 1328   PHURINE 7.0 01/30/2023 1328   GLUCOSEU Negative 08/16/2024 0840   HGBUR SMALL (A) 01/30/2023 1328   BILIRUBINUR Negative 09/20/2024 1634   BILIRUBINUR Negative 08/16/2024 0840   KETONESUR NEGATIVE 01/30/2023 1328   PROTEINUR Positive (A) 09/20/2024 1634   PROTEINUR Negative 08/16/2024 0840   PROTEINUR NEGATIVE 01/30/2023 1328   UROBILINOGEN  0.2 09/20/2024 1634   NITRITE Negative 09/20/2024 1634   NITRITE Positive (A) 08/16/2024 0840   NITRITE NEGATIVE 01/30/2023 1328   LEUKOCYTESUR Negative 09/20/2024 1634   LEUKOCYTESUR 2+ (A) 08/16/2024 0840   LEUKOCYTESUR NEGATIVE 01/30/2023 1328    Pertinent Imaging: Urine reviewed and sent for culture  Assessment & Plan: Clinically  patient did not complain of cystitis symptoms but urine looked infected.  Role of daily Macrodantin  discussed.  Start percutaneous tibial nerve stimulation here in [place] discussed.  Stay on the Myrbetriq  and Vesicare  for now.  Call if culture positive.  Started Macrodantin .  Start PTNS.  I was surprised the patient was quite vague regarding the prophylaxis.  1. Mixed incontinence (Primary)  - Urinalysis, Complete   No follow-ups on file.  Jane DELENA Elizabeth, MD  [facility] [address], [address] [place], [place] [address] [phone]     [1]  Allergies Allergen Reactions   Aripiprazole Other (See Comments)    BECOMES  VIOLENT    Seroquel  [Quetiapine  Fumarate] Other (See Comments)    BECOMES VIOLENT   Chlorpromazine  Other (See Comments)    SEVERE ANXIETY    Gabapentin  Other (See Comments)    NIGHTMARES    Prednisone  Hives

## 2024-10-19 ENCOUNTER — Telehealth: Payer: Self-pay | Admitting: Family Medicine

## 2024-10-19 LAB — URINALYSIS, COMPLETE
Bilirubin, UA: NEGATIVE
Glucose, UA: NEGATIVE
Ketones, UA: NEGATIVE
Nitrite, UA: NEGATIVE
Protein,UA: NEGATIVE
Specific Gravity, UA: 1.02 (ref 1.005–1.030)
Urobilinogen, Ur: 0.2 mg/dL (ref 0.2–1.0)
pH, UA: 6 (ref 5.0–7.5)

## 2024-10-19 LAB — MICROSCOPIC EXAMINATION

## 2024-10-19 NOTE — Telephone Encounter (Signed)
 [facility] faxed refill request for the following medications:  hydrOXYzine  (VISTARIL ) 25 MG capsule     Please advise.

## 2024-10-21 ENCOUNTER — Telehealth: Payer: Self-pay

## 2024-10-21 LAB — CULTURE, URINE COMPREHENSIVE

## 2024-10-21 NOTE — Telephone Encounter (Signed)
 Called patient to get scheduled for PTNS, when I let her know that Medicaid doesn't cover it she said that she doesn't know what to do PTNS. She would like to know if there is something else she could do.

## 2024-10-24 ENCOUNTER — Other Ambulatory Visit: Payer: Self-pay | Admitting: Family Medicine

## 2024-10-25 NOTE — Telephone Encounter (Signed)
 Left voicemail message for patient requesting a return call to discuss weather she would like to have botox or an interstim per Dr.MacDiarmid . No PHI disclosed. Provided main clinic number.

## 2024-10-26 ENCOUNTER — Ambulatory Visit (INDEPENDENT_AMBULATORY_CARE_PROVIDER_SITE_OTHER): Payer: MEDICAID | Admitting: Family Medicine

## 2024-10-30 ENCOUNTER — Encounter: Payer: Self-pay | Admitting: Family Medicine

## 2024-11-01 ENCOUNTER — Other Ambulatory Visit: Payer: Self-pay

## 2024-11-01 MED ORDER — DABIGATRAN ETEXILATE MESYLATE 150 MG PO CAPS: 150.0000 mg | ORAL_CAPSULE | Freq: Two times a day (BID) | ORAL | 1 refills | Status: AC

## 2024-11-01 NOTE — Telephone Encounter (Signed)
 LOV 09/20/24 NOV 12/06/24 LRF 09/23/24 qty:60 r:0

## 2024-11-03 NOTE — Telephone Encounter (Signed)
 Pt wanted to know if her insurance will cover botox or interstim. Pt was informed that I will start a prior auth

## 2024-11-05 ENCOUNTER — Other Ambulatory Visit (INDEPENDENT_AMBULATORY_CARE_PROVIDER_SITE_OTHER): Payer: Self-pay | Admitting: Family Medicine

## 2024-11-05 DIAGNOSIS — E1165 Type 2 diabetes mellitus with hyperglycemia: Secondary | ICD-10-CM

## 2024-11-10 ENCOUNTER — Other Ambulatory Visit: Payer: Self-pay | Admitting: Family Medicine

## 2024-11-10 DIAGNOSIS — K5909 Other constipation: Secondary | ICD-10-CM

## 2024-11-15 ENCOUNTER — Ambulatory Visit (INDEPENDENT_AMBULATORY_CARE_PROVIDER_SITE_OTHER): Payer: MEDICAID | Admitting: Family Medicine

## 2024-11-15 VITALS — BP 135/71 | HR 59 | Temp 97.6°F | Ht 68.0 in | Wt 239.0 lb

## 2024-11-15 DIAGNOSIS — N3001 Acute cystitis with hematuria: Secondary | ICD-10-CM

## 2024-11-15 DIAGNOSIS — Z6836 Body mass index (BMI) 36.0-36.9, adult: Secondary | ICD-10-CM | POA: Diagnosis not present

## 2024-11-15 DIAGNOSIS — E669 Obesity, unspecified: Secondary | ICD-10-CM

## 2024-11-15 DIAGNOSIS — E1165 Type 2 diabetes mellitus with hyperglycemia: Secondary | ICD-10-CM | POA: Diagnosis not present

## 2024-11-15 DIAGNOSIS — Z7985 Long-term (current) use of injectable non-insulin antidiabetic drugs: Secondary | ICD-10-CM

## 2024-11-15 MED ORDER — SEMAGLUTIDE (2 MG/DOSE) 8 MG/3ML ~~LOC~~ SOPN: 2.0000 mg | PEN_INJECTOR | SUBCUTANEOUS | 0 refills | Status: AC

## 2024-11-15 NOTE — Progress Notes (Signed)
 "  SUBJECTIVE:  Chief Complaint: Obesity  Interim History: Patient supposed to get surgery last month but it was unfortuntaely canceled due to misunderstanding  concerning lovenox  usage.  Knee surgery scheduled for February 16th.  Last appointment was in October of 2025.  Holidays came and went. Did not do much for Thanksgiving, Christmas or New Years.  She mentions the holidays themselves were just another day.  She did have more alternative food off meal plan available to her over the last few months.  Plan for the next month will be doing the best she can.  She is going to food pantries to ensure food availability.  Jane [name] is here to discuss her progress with her obesity treatment plan. She is on the [place] and states she is following her eating plan approximately 25 % of the time. She states she is chair exercising 60 minutes 3 times per week.   OBJECTIVE: Visit Diagnoses: Problem List Items Addressed This Visit       Endocrine   Type 2 diabetes mellitus with hyperglycemia (HCC)   Relevant Medications   Semaglutide , 2 MG/DOSE, 8 MG/3ML SOPN     Other   Morbid obesity (HCC)   Relevant Medications   Semaglutide , 2 MG/DOSE, 8 MG/3ML SOPN   Other Visit Diagnoses       Acute cystitis with hematuria    -  Primary     BMI 36.0-36.9,adult           Vitals Temp: 97.6 F (36.4 C) BP: 135/71 Pulse Rate: (!) 59 SpO2: 100 %   Anthropometric Measurements Height: 5' 8 (1.727 m) Weight: 239 lb (108.4 kg) BMI (Calculated): 36.35 Weight at Last Visit: 240 lb Weight Lost Since Last Visit: 1 Weight Gained Since Last Visit: 0 Starting Weight: 283 lb Total Weight Loss (lbs): 44 lb (20 kg)   Body Composition  Body Fat %: 48.1 % Fat Mass (lbs): 115 lbs Muscle Mass (lbs): 118 lbs Total Body Water (lbs): 87.8 lbs Visceral Fat Rating : 14   Other Clinical Data Fasting: no Labs: no Today's Visit #: 53 Starting Date: 03/29/20 Comments: Pesc     ASSESSMENT AND  PLAN: Assessment & Plan Acute cystitis with hematuria Patient is currently on nitrofurantoin  but has repeated UTI's.  She mentions that she sees urology.  No longer on warfarin so no effects on blood thinner now.  Follow up with Urology and PCP if UTI not managed after course of antibiotic. Type 2 diabetes mellitus with hyperglycemia, without long-term current use of insulin  (HCC) Patient reports no change in feelings of satiety with current semaglutide  dosage.  Will increase to 2 mg weekly and follow-up on effects at next appointment.  New prescription sent to pharmacy. BMI 36.0-36.9,adult  Obesity with starting BMI of 43.0    Diet: Jane [name] is currently in the action stage of change. As such, her goal is to continue with weight loss efforts and has agreed to practicing portion control and making smarter food choices, such as increasing vegetables and decreasing simple carbohydrates.   Exercise:  All adults should avoid inactivity. Some activity is better than none, and adults who participate in any amount of physical activity, gain some health benefits.  Behavior Modification:  We discussed the following Behavioral Modification Strategies today: increasing lean protein intake, decreasing simple carbohydrates, increasing vegetables, meal planning and cooking strategies, keeping healthy foods in the home, and planning for success.   Return in about 4 weeks (around 12/13/2024).   She was informed  of the importance of frequent follow up visits to maximize her success with intensive lifestyle modifications for her multiple health conditions.  Attestation Statements:   Reviewed by clinician on day of visit: allergies, medications, problem list, medical history, surgical history, family history, social history, and previous encounter notes.   Adelita Cho, MD "

## 2024-11-16 NOTE — Telephone Encounter (Signed)
 Patient informed that she will need to contact [facility] to determine whether her insurance provides coverage for InterStim, as this procedure is not performed in our office.  Patient was advised that Botox is on formulary; however, she requested information regarding her out-of-pocket cost. Patient verbalized understanding.  Pt voiced understanding.

## 2024-11-17 ENCOUNTER — Encounter: Payer: Self-pay | Admitting: Vascular Surgery

## 2024-11-17 ENCOUNTER — Ambulatory Visit (HOSPITAL_COMMUNITY)
Admission: RE | Admit: 2024-11-17 | Discharge: 2024-11-17 | Disposition: A | Discharge status: Home / Self Care | Payer: MEDICAID | Source: Ambulatory Visit | Attending: Surgery | Admitting: Surgery

## 2024-11-17 ENCOUNTER — Ambulatory Visit: Payer: MEDICAID | Admitting: Vascular Surgery

## 2024-11-17 VITALS — BP 115/76 | HR 60 | Temp 97.9°F | Ht 68.0 in | Wt 239.0 lb

## 2024-11-17 DIAGNOSIS — I87002 Postthrombotic syndrome without complications of left lower extremity: Secondary | ICD-10-CM | POA: Diagnosis not present

## 2024-11-17 DIAGNOSIS — M7989 Other specified soft tissue disorders: Secondary | ICD-10-CM

## 2024-11-17 NOTE — Progress Notes (Signed)
 "  Patient ID: Jane [name], female   DOB: [birth year], 59 y.o.   MRN: [id number]  Reason for Consult: Follow-up   Referred by Myrla Jon HERO, MD  Subjective:     HPI:  Jane [name] is a 59 y.o. 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  She has undergone stenting of her left external iliac and common femoral veins following percutaneous mechanical thrombectomy last June.  She now is maintained on  Pradaxa  although did transition to Lovenox  for planned left knee replacement which did not occur.  Surgery is now scheduled for February 17 this year..  She follows with healthy weight and wellness unfortunately has recently gained weight.  She states that her leg swells at the end of the day in the morning is back to mostly normal size.  She is mostly limited by left knee pain at this time.  She remains noncompliant with compression stockings.  Past Medical History:  Diagnosis Date   ADD (attention deficit disorder)    Allergy    Anxiety    Arthritis 2021   Back pain    Bilateral swelling of feet    Bipolar 1 disorder (HCC)    Bipolar 1 disorder (HCC)    Bipolar disorder (HCC)    Blood transfusion without reported diagnosis 2020   Chewing difficulty    Chronic fatigue syndrome    Chronic kidney disease    Stage 3 kidney disease;dx by Dr. Dallas Gelineau.    Clotting disorder    I'm on a blood  thinner   Constipation    Depression 2000   Diabetes mellitus without complication (HCC)    diet controlled   Dyspnea    with exertion   GERD (gastroesophageal reflux disease)    Headache    migraines   High cholesterol    History of blood clots    History of DVT (deep vein thrombosis)    left leg   Hypertension    states under control with meds., has been on med. x 2 years   Hypothyroidism    Joint pain    Neuromuscular disorder (HCC)    Neuropathy    Obsessive-compulsive disorder     Peripheral vascular disease    Prediabetes    Respiratory failure requiring intubation (HCC)    Restless leg syndrome    Shortness of breath    Sleep apnea    Thrombocytopenia 09/15/2019   Trigger thumb of left hand 01/2018   Trigger thumb of right hand    Family History  Problem Relation Age of Onset   Heart disease Mother    Hyperlipidemia Mother    Hypertension Mother    Bipolar disorder Mother    Stroke Mother    Depression Mother    Sleep apnea Mother    Obesity Mother    Diabetes Father    Heart disease Father    Hyperlipidemia Father    Hypertension Father    Sleep apnea Father    Obesity Father    Drug abuse Daughter    ADD / ADHD Daughter    Drug abuse Daughter    Anxiety disorder Daughter    Bipolar disorder Daughter    Hypertension Sister    Hypertension Brother    Hyperlipidemia Brother    Heart disease Brother    Early death Brother    Kidney disease Brother    Bipolar disorder Maternal Aunt    Suicidality Maternal Aunt  Past Surgical History:  Procedure Laterality Date   ABDOMINAL HYSTERECTOMY  06/2016   complete   APPLICATION OF WOUND VAC Left 04/27/2019   Procedure: APPLICATION OF WOUND VAC LEFT GROIN;  Surgeon: Sheree Penne Bruckner, MD;  Location: [facility] OR;  Service: Vascular;  Laterality: Left;   AV FISTULA PLACEMENT Left 11/24/2018   Procedure: ARTERIOVENOUS (AV) FISTULA CREATION LEFT SFA TO LEFT FEMORAL VEIN;  Surgeon: Sheree Penne Bruckner, MD;  Location: [facility] OR;  Service: Vascular;  Laterality: Left;   BACK SURGERY     CHOLECYSTECTOMY     COLONOSCOPY WITH PROPOFOL  N/A 11/22/2019   Procedure: COLONOSCOPY WITH PROPOFOL ;  Surgeon: Therisa Bi, MD;  Location: [facility] ENDOSCOPY;  Service: Gastroenterology;  Laterality: N/A;   COLONOSCOPY WITH PROPOFOL  N/A 12/09/2022   Procedure: COLONOSCOPY WITH PROPOFOL ;  Surgeon: Therisa Bi, MD;  Location: [facility] ENDOSCOPY;  Service: Gastroenterology;  Laterality: N/A;   FEMORAL ARTERY EXPLORATION   03/30/2019   Procedure: Left Common Femoral Artery and Vein Exploration;  Surgeon: Sheree Penne Bruckner, MD;  Location: [facility] OR;  Service: Vascular;;   FEMORAL-FEMORAL BYPASS GRAFT Left 11/24/2018   Procedure: BYPASS GRAFT FEMORAL-FEMORAL VENOUS LEFT TO RIGHT PALMA PROCEDURE USING CRYOVEIN;  Surgeon: Sheree Penne Bruckner, MD;  Location: [facility] OR;  Service: Vascular;  Laterality: Left;   GROIN DEBRIDEMENT Left 04/27/2019   Procedure: QUILLIAN DEBRIDEMENT;  Surgeon: Sheree Penne Bruckner, MD;  Location: [facility] OR;  Service: Vascular;  Laterality: Left;   INSERTION OF ILIAC STENT  03/30/2019   Procedure: Stent of left common, external iliac veins and left common femoral vein;  Surgeon: Sheree Penne Bruckner, MD;  Location: [facility] OR;  Service: Vascular;;   KNEE ARTHROSCOPY WITH MENISCAL REPAIR Left 11/14/2020   Procedure: LEFT KNEE ARTHROSCOPY WITH PARTIAL MEDIAL MENISCECTOMY;  Surgeon: Margrette Taft BRAVO, MD;  Location: [facility] ORS;  Service: Orthopedics;  Laterality: Left;   LOWER EXTREMITY INTERVENTION Left 04/05/2024   Procedure: LOWER EXTREMITY INTERVENTION;  Surgeon: Sheree Penne Bruckner, MD;  Location: [facility] INVASIVE CV LAB;  Service: Cardiovascular;  Laterality: Left;   LOWER EXTREMITY VENOGRAPHY N/A 08/17/2018   Procedure: LOWER EXTREMITY VENOGRAPHY - Central Venogram;  Surgeon: Sheree Penne Bruckner, MD;  Location: [facility] INVASIVE CV LAB;  Service: Cardiovascular;  Laterality: N/A;   LOWER EXTREMITY VENOGRAPHY Bilateral 03/09/2019   Procedure: LOWER EXTREMITY VENOGRAPHY;  Surgeon: Sheree Penne Bruckner, MD;  Location: [facility] INVASIVE CV LAB;  Service: Cardiovascular;  Laterality: Bilateral;   LOWER EXTREMITY VENOGRAPHY Left 08/16/2019   Procedure: LOWER EXTREMITY VENOGRAPHY;  Surgeon: Sheree Penne Bruckner, MD;  Location: [facility] INVASIVE CV LAB;  Service: Cardiovascular;  Laterality: Left;   LOWER EXTREMITY VENOGRAPHY Left 03/25/2022   Procedure: LOWER EXTREMITY VENOGRAPHY;  Surgeon: Sheree Penne Bruckner, MD;  Location: [facility] INVASIVE CV LAB;  Service: Cardiovascular;  Laterality: Left;  IVUS   LOWER EXTREMITY VENOGRAPHY Left 04/05/2024   Procedure: LOWER EXTREMITY VENOGRAPHY;  Surgeon: Sheree Penne Bruckner, MD;  Location: [facility] INVASIVE CV LAB;  Service: Cardiovascular;  Laterality: Left;   LUMBAR FUSION  11/21/2000   L5-S1   LUMBAR SPINE SURGERY     x 2 others   PATCH ANGIOPLASTY Left 03/30/2019   Procedure: Patch Angioplasty of the Left Common Femoral Vein using Venosure Biologic patch;  Surgeon: Sheree Penne Bruckner, MD;  Location: [facility] OR;  Service: Vascular;  Laterality: Left;   PERIPHERAL VASCULAR INTERVENTION Left 08/16/2019   Procedure: PERIPHERAL VASCULAR INTERVENTION;  Surgeon: Sheree Penne Bruckner, MD;  Location: [facility] INVASIVE CV LAB;  Service: Cardiovascular;  Laterality: Left;  common femoral/femoral vein stent   PERIPHERAL VASCULAR INTERVENTION Left 03/25/2022   Procedure: PERIPHERAL VASCULAR INTERVENTION;  Surgeon: Sheree Penne Bruckner, MD;  Location: [facility] INVASIVE CV LAB;  Service: Cardiovascular;  Laterality: Left;  COMMON FEMORAL VEIN   PERIPHERAL VASCULAR THROMBECTOMY Left 03/25/2022   Procedure: PERIPHERAL VASCULAR THROMBECTOMY;  Surgeon: Sheree Penne Bruckner, MD;  Location: [facility] INVASIVE CV LAB;  Service: Cardiovascular;  Laterality: Left;  EXTREMITY/IVC   PERIPHERAL VASCULAR THROMBECTOMY Left 04/05/2024   Procedure: PERIPHERAL VASCULAR THROMBECTOMY;  Surgeon: Sheree Penne Bruckner, MD;  Location: [facility] INVASIVE CV LAB;  Service: Cardiovascular;  Laterality: Left;   PERIPHERAL VASCULAR ULTRASOUND/IVUS Left 04/05/2024   Procedure: Peripheral Vascular Ultrasound/IVUS;  Surgeon: Sheree Penne Bruckner, MD;  Location: [facility] INVASIVE CV LAB;  Service: Cardiovascular;  Laterality: Left;   SPINE SURGERY     TRIGGER FINGER RELEASE Right 12/01/2017   Procedure: RELEASE TRIGGER FINGER/A-1 PULLEY RIGHT THUMB;  Surgeon: Murrell Drivers, MD;  Location: [facility];  Service: Orthopedics;  Laterality: Right;   TRIGGER FINGER RELEASE Left 01/26/2018   Procedure: LEFT TRIGGER THUMB RELEASE;  Surgeon: Murrell Drivers, MD;  Location: [facility];  Service: Orthopedics;  Laterality: Left;   ULTRASOUND GUIDANCE FOR VASCULAR ACCESS Right 03/30/2019   Procedure: Ultrasound-guided cannulation right internal jugular vein;  Surgeon: Sheree Penne Bruckner, MD;  Location: [facility] OR;  Service: Vascular;  Laterality: Right;   VSD REPAIR      Short Social History:  Social History   Tobacco Use   Smoking status: Never   Smokeless tobacco: Never  Substance Use Topics   Alcohol use: Never    Allergies[1]  Current Outpatient Medications  Medication Sig Dispense Refill   AJOVY 225 MG/1.5ML SOAJ Inject 1 mL into the skin every 30 (thirty) days.     ALPRAZolam  (XANAX ) 1 MG tablet Take 1 tablet (1 mg total) by mouth 2 (two) times daily as needed. for anxiety 60 tablet 2   amphetamine -dextroamphetamine  (ADDERALL) 30 MG tablet Take 1 tablet by mouth 2 (two) times daily. 60 tablet 0   amphetamine -dextroamphetamine  (ADDERALL) 30 MG tablet Take 1 tablet by mouth 2 (two) times daily. 60 tablet 0   amphetamine -dextroamphetamine  (ADDERALL) 30 MG tablet Take 1 tablet by mouth 2 (two) times daily. 60 tablet 0   aspirin  EC 81 MG tablet Take 81 mg by mouth daily.     atorvastatin  (LIPITOR) 40 MG tablet TAKE 1 TABLET BY MOUTH DAILY 90 tablet 3   cetirizine  (ZYRTEC ) 10 MG tablet TAKE 1 TABLET BY MOUTH DAILY 90 tablet 3   cromolyn (OPTICROM) 4 % ophthalmic solution Place 1 drop into both eyes 2 (two) times daily.     cyclobenzaprine  (FLEXERIL ) 5 MG tablet Take 1 tablet (5 mg total) by mouth 3 (three) times daily as needed for muscle spasms. 90 tablet 2   dabigatran  (PRADAXA ) 150 MG CAPS capsule Take 1 capsule (150 mg total) by mouth 2 (two) times daily. 180 capsule 1   enoxaparin  (LOVENOX ) 100 MG/ML injection Inject 1 mL (100 mg total) into the skin  every 12 (twelve) hours for 7 days. (Patient not taking: Reported on 11/15/2024) 14 mL 0   famotidine  (PEPCID ) 20 MG tablet Take 20 mg by mouth daily.     FLUoxetine  (PROZAC ) 40 MG capsule Take 1 capsule (40 mg total) by mouth 2 (two) times daily. 180 capsule 2   HYDROcodone -acetaminophen  (NORCO) 10-325 MG tablet Take 1 tablet by mouth every 6 (six) hours as needed. To last 30 days  from fill date 120 tablet 0   [START ON 11/18/2024] HYDROcodone -acetaminophen  (NORCO) 10-325 MG tablet Take 1 tablet by mouth every 6 (six) hours as needed. To last 30 days from fill date 120 tablet 0   hydrOXYzine  (VISTARIL ) 25 MG capsule Take 1 capsule (25 mg total) by mouth every 8 (eight) hours as needed for itching. 30 capsule 0   levothyroxine  (SYNTHROID ) 125 MCG tablet TAKE 1 TABLET BY MOUTH DAILY BEFORE BREAKFAST 90 tablet 3   linaclotide  (LINZESS ) 290 MCG CAPS capsule TAKE ONE CAPSULE BY MOUTH DAILY 90 capsule 1   losartan  (COZAAR ) 25 MG tablet TAKE 1 TABLET BY MOUTH DAILY 30 tablet 1   mirabegron  ER (MYRBETRIQ ) 50 MG TB24 tablet Take 1 tablet (50 mg total) by mouth daily. 30 tablet 11   Multiple Vitamin (MULTIVITAMIN) capsule Take 1 capsule by mouth daily.     naloxone  (NARCAN ) nasal spray 4 mg/0.1 mL Place 1 spray into the nose as needed for up to 365 doses (for opioid-induced respiratory depresssion). In case of emergency (overdose), spray once into each nostril. If no response within 3 minutes, repeat application and call 911. 1 each 1   nitrofurantoin  (MACRODANTIN ) 100 MG capsule Take 1 capsule (100 mg total) by mouth daily. 30 capsule 11   Omega-3 Fatty Acids (OMEGA 3 PO) Take 1 capsule by mouth daily.     ondansetron  (ZOFRAN ) 4 MG tablet Take 1 tablet (4 mg total) by mouth every 8 (eight) hours as needed for nausea or vomiting. 20 tablet 0   pantoprazole  (PROTONIX ) 40 MG tablet TAKE 1 TABLET BY MOUTH DAILY 90 tablet 3   polyethylene glycol powder (GLYCOLAX /MIRALAX ) 17 GM/SCOOP powder Take 17 g by mouth daily.  Dissolve 1 capful (17g) in 4-8 ounces of liquid and take by mouth daily. 850 g 1   potassium chloride  (KLOR-CON  M) 10 MEQ tablet Take 1 tablet (10 mEq total) by mouth 3 (three) times daily. 270 tablet 3   pregabalin  (LYRICA ) 100 MG capsule TAKE ONE CAPSULE BY MOUTH FOUR TIMES DAILY 120 capsule 5   RESTASIS  0.05 % ophthalmic emulsion Place 1 drop into both eyes 2 (two) times daily.     rOPINIRole  (REQUIP ) 2 MG tablet TAKE 1 TABLET BY MOUTH TWICE DAILY 180 tablet 0   Semaglutide , 2 MG/DOSE, 8 MG/3ML SOPN Inject 2 mg as directed once a week. 3 mL 0   solifenacin  (VESICARE ) 5 MG tablet Take 1 tablet (5 mg total) by mouth daily. 30 tablet 11   traZODone  (DESYREL ) 150 MG tablet Take 1 tablet (150 mg total) by mouth at bedtime. 30 tablet 2   triamcinolone  ointment (KENALOG ) 0.5 % Apply 1 Application topically 2 (two) times daily. 30 g 0   UBRELVY  100 MG TABS Take 100 mg by mouth daily as needed (migraines). May take a second 100 mg dose 2 hours later as needed for migraines     No current facility-administered medications for this visit.    Review of Systems  Constitutional:  Constitutional negative. HENT: HENT negative.  Eyes: Eyes negative.  Respiratory: Respiratory negative.  Cardiovascular: Positive for leg swelling.  GI: Gastrointestinal negative.  Musculoskeletal: Positive for joint pain.  Skin: Skin negative.  Neurological: Neurological negative. Hematologic: Hematologic/lymphatic negative.  Psychiatric: Psychiatric negative.        Objective:  Objective   Vitals:   11/17/24 0845  BP: 115/76  Pulse: 60  Temp: 97.9 F (36.6 C)  SpO2: 97%     Physical Exam HENT:  Head: Normocephalic.     Nose: Nose normal.  Eyes:     Pupils: Pupils are equal, round, and reactive to light.  Cardiovascular:     Rate and Rhythm: Normal rate.  Pulmonary:     Effort: Pulmonary effort is normal.  Abdominal:     General: Abdomen is flat.  Musculoskeletal:     Right lower leg: No  edema.     Left lower leg: Edema present.     Comments: Left lower extremity with mild edema.  Right leg measures 38 cm and left leg 39.5 cm (measured 10 cm from tibial tuberosity).  Neurological:     Mental Status: She is alert.     Data: IVC/Iliac Findings:  +----------+------+--------+--------------+    IVC    PatentThrombus   Comments     +----------+------+--------+--------------+  IVC Prox  patent        small diameter  +----------+------+--------+--------------+  IVC Mid   patent        small diameter  +----------+------+--------+--------------+  IVC Distalpatent        small diameter  +----------+------+--------+--------------+     +-------------------+---------+-----------+---------+-----------+--------+         CIV        RT-PatentRT-ThrombusLT-PatentLT-ThrombusComments  +-------------------+---------+-----------+---------+-----------+--------+  Common Iliac Prox                                  acute             +-------------------+---------+-----------+---------+-----------+--------+  Common Iliac Mid                                   acute             +-------------------+---------+-----------+---------+-----------+--------+  Common Iliac Distal                                acute             +-------------------+---------+-----------+---------+-----------+--------+      +-------------------------+---------+-----------+---------+-----------+----  ----+            EIV            RT-PatentRT-ThrombusLT-PatentLT-ThrombusComments  +-------------------------+---------+-----------+---------+-----------+----  ----+  External Iliac Vein Prox                                 acute              +-------------------------+---------+-----------+---------+-----------+----  ----+  External Iliac Vein Mid                                  acute               +-------------------------+---------+-----------+---------+-----------+----  ----+  External Iliac Vein                                      acute              Distal                                                                      +-------------------------+---------+-----------+---------+-----------+----  ----+  Summary:  IVC/Iliac: Small caliber IVC. There is no evidence of thrombus involving  the IVC. There is evidence of acute thrombus involving the left common  iliac vein. There is evidence of acute thrombus involving the left  external iliac vein. 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         Assessment/Plan:     59 year old female with extensive left lower extremity venous history now with concern for occlusion of the left common and external iliac veins stents.  Thankfully her leg remains without significant edema despite her underlying left knee limitations and her not wearing compression stockings.  We will fit her for a 15 to 20 mmHg knee-high stocking today that hopefully she will wear at least occasionally.  We discussed that she will have increased swelling at the time of her knee surgery and she demonstrates good understanding and we also discussed that she will need to be compliant with the Lovenox  bridge as directed by orthopedic surgeon.  Certainly if her swelling worsens we would need to consider intervention of the left common and external iliac vein stents but currently appears that they have silently occluded from a symptom standpoint which may be the best case scenario for her moving forward.  We will plan to see her back in 6 months and repeat her IVC iliac duplex unless her symptoms significantly worsen at which time we would consider repeat venous intervention.     Penne Lonni Colorado MD [facility]      [1]  Allergies Allergen Reactions   Aripiprazole Other (See  Comments)    BECOMES  VIOLENT    Seroquel  [Quetiapine  Fumarate] Other (See Comments)    BECOMES VIOLENT   Chlorpromazine  Other (See Comments)    SEVERE ANXIETY    Gabapentin  Other (See Comments)    NIGHTMARES    Prednisone  Hives   "

## 2024-11-18 ENCOUNTER — Other Ambulatory Visit: Payer: Self-pay | Admitting: *Deleted

## 2024-11-18 ENCOUNTER — Other Ambulatory Visit: Payer: Self-pay | Admitting: Family Medicine

## 2024-11-18 DIAGNOSIS — I87002 Postthrombotic syndrome without complications of left lower extremity: Secondary | ICD-10-CM

## 2024-11-18 DIAGNOSIS — Z86718 Personal history of other venous thrombosis and embolism: Secondary | ICD-10-CM

## 2024-11-18 DIAGNOSIS — I871 Compression of vein: Secondary | ICD-10-CM

## 2024-11-18 MED ORDER — ENOXAPARIN SODIUM 100 MG/ML IJ SOSY: 100.0000 mg | PREFILLED_SYRINGE | Freq: Two times a day (BID) | INTRAMUSCULAR | 0 refills | Status: AC

## 2024-11-18 NOTE — Progress Notes (Unsigned)
 Upcoming LTKA on 12/21/24. Needs to hold Pradaxa  x5 days pre-op and take Lovenox  bridge (100mg  BID) while off of pradaxa . Last dose the night before surgery. Resume pradaxa  post-op.

## 2024-11-19 ENCOUNTER — Telehealth: Payer: Self-pay

## 2024-11-19 NOTE — Telephone Encounter (Signed)
 Auth Submission: APPROVED Site of care: Urology Payer: [place] medicaid Medication & CPT/J Code(s) submitted: Botox Diagnosis Code:  Route of submission (phone, fax, portal): Fax Phone # Fax # Auth type: Buy/Bill PB Units/visits requested:  Reference number: [id number] Approval from: 11/18/24 to 11/03/25

## 2024-11-22 ENCOUNTER — Encounter: Payer: MEDICAID | Admitting: Dermatology

## 2024-11-24 ENCOUNTER — Encounter: Payer: MEDICAID | Admitting: Dermatology

## 2024-11-24 NOTE — Assessment & Plan Note (Signed)
 Patient reports no change in feelings of satiety with current semaglutide  dosage.  Will increase to 2 mg weekly and follow-up on effects at next appointment.  New prescription sent to pharmacy.

## 2024-11-24 NOTE — Telephone Encounter (Signed)
 Left voicemail for patient to return call to provide Botox codes so she can check with her insurance regarding out-of-pocket cost  Procedure Rniz:[id number] Drug Rniz:[id number]

## 2024-11-25 ENCOUNTER — Encounter (HOSPITAL_COMMUNITY): Payer: Self-pay | Admitting: Psychiatry

## 2024-11-25 ENCOUNTER — Other Ambulatory Visit (HOSPITAL_COMMUNITY): Payer: Self-pay | Admitting: Psychiatry

## 2024-11-25 ENCOUNTER — Telehealth (HOSPITAL_COMMUNITY): Payer: MEDICAID | Admitting: Psychiatry

## 2024-11-25 DIAGNOSIS — F900 Attention-deficit hyperactivity disorder, predominantly inattentive type: Secondary | ICD-10-CM

## 2024-11-25 DIAGNOSIS — F5105 Insomnia due to other mental disorder: Secondary | ICD-10-CM | POA: Diagnosis not present

## 2024-11-25 DIAGNOSIS — F3130 Bipolar disorder, current episode depressed, mild or moderate severity, unspecified: Secondary | ICD-10-CM | POA: Diagnosis not present

## 2024-11-25 MED ORDER — ALPRAZOLAM 1 MG PO TABS: 1.0000 mg | ORAL_TABLET | Freq: Two times a day (BID) | ORAL | 2 refills | Status: AC | PRN

## 2024-11-25 MED ORDER — FLUOXETINE HCL 40 MG PO CAPS: 40.0000 mg | ORAL_CAPSULE | Freq: Two times a day (BID) | ORAL | 2 refills | Status: AC

## 2024-11-25 MED ORDER — AMPHETAMINE-DEXTROAMPHETAMINE 30 MG PO TABS: 1.0000 | ORAL_TABLET | Freq: Two times a day (BID) | ORAL | 0 refills | Status: AC

## 2024-11-25 MED ORDER — AMPHETAMINE-DEXTROAMPHETAMINE 30 MG PO TABS: 30.0000 mg | ORAL_TABLET | Freq: Two times a day (BID) | ORAL | 0 refills | Status: AC

## 2024-11-25 MED ORDER — TRAZODONE HCL 150 MG PO TABS: 150.0000 mg | ORAL_TABLET | Freq: Every day | ORAL | 2 refills | Status: AC

## 2024-11-25 NOTE — Telephone Encounter (Signed)
 Patient returned call to office, and I relayed the codes to her.

## 2024-11-25 NOTE — Progress Notes (Signed)
 Virtual Visit via Video Note  I connected with Jane [name] on 11/25/24 at  8:40 AM EST by a video enabled telemedicine application and verified that I am speaking with the correct person using two identifiers.  Location: Patient: home Provider: office   I discussed the limitations of evaluation and management by telemedicine and the availability of in person appointments. The patient expressed understanding and agreed to proceed.     I discussed the assessment and treatment plan with the patient. The patient was provided an opportunity to ask questions and all were answered. The patient agreed with the plan and demonstrated an understanding of the instructions.   The patient was advised to call back or seek an in-person evaluation if the symptoms worsen or if the condition fails to improve as anticipated.  I provided 20 minutes of non-face-to-face time during this encounter.   Barnie Gull, MD  [facility] MD/PA/NP OP Progress Note  11/25/2024 8:52 AM Jane [name]  MRN:  [id number]  Chief Complaint:  Chief Complaint  Patient presents with   Anxiety   Depression   Follow-up   ADD   HPI: This patient is a 59 year old divorced white female who lives alone in [place]. She is on disability due to bipolar disorder.   The patient returns for follow-up after 3 months regarding her bipolar disorder depressive type ADD as well as generalized anxiety.  Overall she states she is doing well.  She still does not have contact with her family but she claims it does not bother me.  She goes to chair yoga 3 times a week with her peers support and she has friends who check on her.  The patient denies significant depression thoughts of self-harm or suicide.  She is sleeping well with the trazodone .  Xanax  continues to help her anxiety.  Her focus is going well with the Adderall.  She denies any other issues.  She is going to have knee replacement surgery next month. Visit Diagnosis:    ICD-10-CM    1. Bipolar I disorder, most recent episode depressed (HCC)  F31.30     2. Attention deficit hyperactivity disorder (ADHD), predominantly inattentive type  F90.0     3. Insomnia due to mental disorder  F51.05       Past Psychiatric History: Long-term outpatient treatment  Past Medical History:  Past Medical History:  Diagnosis Date   ADD (attention deficit disorder)    Allergy    Anxiety    Arthritis 2021   Back pain    Bilateral swelling of feet    Bipolar 1 disorder (HCC)    Bipolar 1 disorder (HCC)    Bipolar disorder (HCC)    Blood transfusion without reported diagnosis 2020   Chewing difficulty    Chronic fatigue syndrome    Chronic kidney disease    Stage 3 kidney disease;dx by Dr. Dallas Gelineau.    Clotting disorder    I'm on a blood  thinner   Constipation    Depression 2000   Diabetes mellitus without complication (HCC)    diet controlled   Dyspnea    with exertion   GERD (gastroesophageal reflux disease)    Headache    migraines   High cholesterol    History of blood clots    History of DVT (deep vein thrombosis)    left leg   Hypertension    states under control with meds., has been on med. x 2 years   Hypothyroidism    Joint pain  Neuromuscular disorder (HCC)    Neuropathy    Obsessive-compulsive disorder    Peripheral vascular disease    Prediabetes    Respiratory failure requiring intubation (HCC)    Restless leg syndrome    Shortness of breath    Sleep apnea    Thrombocytopenia 09/15/2019   Trigger thumb of left hand 01/2018   Trigger thumb of right hand     Past Surgical History:  Procedure Laterality Date   ABDOMINAL HYSTERECTOMY  06/2016   complete   APPLICATION OF WOUND VAC Left 04/27/2019   Procedure: APPLICATION OF WOUND VAC LEFT GROIN;  Surgeon: Sheree Penne Bruckner, MD;  Location: [facility] OR;  Service: Vascular;  Laterality: Left;   AV FISTULA PLACEMENT Left 11/24/2018   Procedure: ARTERIOVENOUS (AV) FISTULA CREATION LEFT  SFA TO LEFT FEMORAL VEIN;  Surgeon: Sheree Penne Bruckner, MD;  Location: [facility] OR;  Service: Vascular;  Laterality: Left;   BACK SURGERY     CHOLECYSTECTOMY     COLONOSCOPY WITH PROPOFOL  N/A 11/22/2019   Procedure: COLONOSCOPY WITH PROPOFOL ;  Surgeon: Therisa Bi, MD;  Location: [facility] ENDOSCOPY;  Service: Gastroenterology;  Laterality: N/A;   COLONOSCOPY WITH PROPOFOL  N/A 12/09/2022   Procedure: COLONOSCOPY WITH PROPOFOL ;  Surgeon: Therisa Bi, MD;  Location: [facility] ENDOSCOPY;  Service: Gastroenterology;  Laterality: N/A;   FEMORAL ARTERY EXPLORATION  03/30/2019   Procedure: Left Common Femoral Artery and Vein Exploration;  Surgeon: Sheree Penne Bruckner, MD;  Location: [facility] OR;  Service: Vascular;;   FEMORAL-FEMORAL BYPASS GRAFT Left 11/24/2018   Procedure: BYPASS GRAFT FEMORAL-FEMORAL VENOUS LEFT TO RIGHT PALMA PROCEDURE USING CRYOVEIN;  Surgeon: Sheree Penne Bruckner, MD;  Location: [facility] OR;  Service: Vascular;  Laterality: Left;   GROIN DEBRIDEMENT Left 04/27/2019   Procedure: QUILLIAN DEBRIDEMENT;  Surgeon: Sheree Penne Bruckner, MD;  Location: [facility] OR;  Service: Vascular;  Laterality: Left;   INSERTION OF ILIAC STENT  03/30/2019   Procedure: Stent of left common, external iliac veins and left common femoral vein;  Surgeon: Sheree Penne Bruckner, MD;  Location: [facility] OR;  Service: Vascular;;   KNEE ARTHROSCOPY WITH MENISCAL REPAIR Left 11/14/2020   Procedure: LEFT KNEE ARTHROSCOPY WITH PARTIAL MEDIAL MENISCECTOMY;  Surgeon: Margrette Taft BRAVO, MD;  Location: [facility];  Service: Orthopedics;  Laterality: Left;   LOWER EXTREMITY INTERVENTION Left 04/05/2024   Procedure: LOWER EXTREMITY INTERVENTION;  Surgeon: Sheree Penne Bruckner, MD;  Location: [facility] INVASIVE CV LAB;  Service: Cardiovascular;  Laterality: Left;   LOWER EXTREMITY VENOGRAPHY N/A 08/17/2018   Procedure: LOWER EXTREMITY VENOGRAPHY - Central Venogram;  Surgeon: Sheree Penne Bruckner, MD;  Location: [facility] INVASIVE CV LAB;   Service: Cardiovascular;  Laterality: N/A;   LOWER EXTREMITY VENOGRAPHY Bilateral 03/09/2019   Procedure: LOWER EXTREMITY VENOGRAPHY;  Surgeon: Sheree Penne Bruckner, MD;  Location: [facility] INVASIVE CV LAB;  Service: Cardiovascular;  Laterality: Bilateral;   LOWER EXTREMITY VENOGRAPHY Left 08/16/2019   Procedure: LOWER EXTREMITY VENOGRAPHY;  Surgeon: Sheree Penne Bruckner, MD;  Location: [facility] INVASIVE CV LAB;  Service: Cardiovascular;  Laterality: Left;   LOWER EXTREMITY VENOGRAPHY Left 03/25/2022   Procedure: LOWER EXTREMITY VENOGRAPHY;  Surgeon: Sheree Penne Bruckner, MD;  Location: [facility] INVASIVE CV LAB;  Service: Cardiovascular;  Laterality: Left;  IVUS   LOWER EXTREMITY VENOGRAPHY Left 04/05/2024   Procedure: LOWER EXTREMITY VENOGRAPHY;  Surgeon: Sheree Penne Bruckner, MD;  Location: [facility] INVASIVE CV LAB;  Service: Cardiovascular;  Laterality: Left;   LUMBAR FUSION  11/21/2000   L5-S1   LUMBAR SPINE SURGERY     x  2 others   PATCH ANGIOPLASTY Left 03/30/2019   Procedure: Patch Angioplasty of the Left Common Femoral Vein using Venosure Biologic patch;  Surgeon: Sheree Penne Bruckner, MD;  Location: [facility] OR;  Service: Vascular;  Laterality: Left;   PERIPHERAL VASCULAR INTERVENTION Left 08/16/2019   Procedure: PERIPHERAL VASCULAR INTERVENTION;  Surgeon: Sheree Penne Bruckner, MD;  Location: [facility] INVASIVE CV LAB;  Service: Cardiovascular;  Laterality: Left;  common femoral/femoral vein stent   PERIPHERAL VASCULAR INTERVENTION Left 03/25/2022   Procedure: PERIPHERAL VASCULAR INTERVENTION;  Surgeon: Sheree Penne Bruckner, MD;  Location: [facility] INVASIVE CV LAB;  Service: Cardiovascular;  Laterality: Left;  COMMON FEMORAL VEIN   PERIPHERAL VASCULAR THROMBECTOMY Left 03/25/2022   Procedure: PERIPHERAL VASCULAR THROMBECTOMY;  Surgeon: Sheree Penne Bruckner, MD;  Location: [facility] INVASIVE CV LAB;  Service: Cardiovascular;  Laterality: Left;  EXTREMITY/IVC   PERIPHERAL VASCULAR THROMBECTOMY Left  04/05/2024   Procedure: PERIPHERAL VASCULAR THROMBECTOMY;  Surgeon: Sheree Penne Bruckner, MD;  Location: [facility] INVASIVE CV LAB;  Service: Cardiovascular;  Laterality: Left;   PERIPHERAL VASCULAR ULTRASOUND/IVUS Left 04/05/2024   Procedure: Peripheral Vascular Ultrasound/IVUS;  Surgeon: Sheree Penne Bruckner, MD;  Location: [facility] INVASIVE CV LAB;  Service: Cardiovascular;  Laterality: Left;   SPINE SURGERY     TRIGGER FINGER RELEASE Right 12/01/2017   Procedure: RELEASE TRIGGER FINGER/A-1 PULLEY RIGHT THUMB;  Surgeon: Murrell Drivers, MD;  Location: [facility];  Service: Orthopedics;  Laterality: Right;   TRIGGER FINGER RELEASE Left 01/26/2018   Procedure: LEFT TRIGGER THUMB RELEASE;  Surgeon: Murrell Drivers, MD;  Location: [facility];  Service: Orthopedics;  Laterality: Left;   ULTRASOUND GUIDANCE FOR VASCULAR ACCESS Right 03/30/2019   Procedure: Ultrasound-guided cannulation right internal jugular vein;  Surgeon: Sheree Penne Bruckner, MD;  Location: [facility] OR;  Service: Vascular;  Laterality: Right;   VSD REPAIR      Family Psychiatric History: See below  Family History:  Family History  Problem Relation Age of Onset   Heart disease Mother    Hyperlipidemia Mother    Hypertension Mother    Bipolar disorder Mother    Stroke Mother    Depression Mother    Sleep apnea Mother    Obesity Mother    Diabetes Father    Heart disease Father    Hyperlipidemia Father    Hypertension Father    Sleep apnea Father    Obesity Father    Drug abuse Daughter    ADD / ADHD Daughter    Drug abuse Daughter    Anxiety disorder Daughter    Bipolar disorder Daughter    Hypertension Sister    Hypertension Brother    Hyperlipidemia Brother    Heart disease Brother    Early death Brother    Kidney disease Brother    Bipolar disorder Maternal Aunt    Suicidality Maternal Aunt     Social History:  Social History   Socioeconomic History   Marital status: Divorced     Spouse name: Not on file   Number of children: 3   Years of education: Not on file   Highest education level: 10th grade  Occupational History   Not on file  Tobacco Use   Smoking status: Never   Smokeless tobacco: Never  Vaping Use   Vaping status: Never Used  Substance and Sexual Activity   Alcohol use: Never   Drug use: Never   Sexual activity: Not Currently    Partners: Male    Birth control/protection: Surgical  Other Topics  Concern   Not on file  Social History Narrative   Not on file   Social Drivers of Health   Tobacco Use: Low Risk (11/25/2024)   Patient History    Smoking Tobacco Use: Never    Smokeless Tobacco Use: Never    Passive Exposure: Not on file  Financial Resource Strain: Medium Risk (08/15/2024)   Overall Financial Resource Strain (CARDIA)    Difficulty of Paying Living Expenses: Somewhat hard  Food Insecurity: Food Insecurity Present (08/15/2024)   Epic    Worried About Programme Researcher, Broadcasting/film/video in the Last Year: Often true    Ran Out of Food in the Last Year: Often true  Transportation Needs: Unmet Transportation Needs (08/15/2024)   Epic    Lack of Transportation (Medical): Yes    Lack of Transportation (Non-Medical): Yes  Physical Activity: Insufficiently Active (08/15/2024)   Exercise Vital Sign    Days of Exercise per Week: 4 days    Minutes of Exercise per Session: 20 min  Stress: No Stress Concern Present (08/15/2024)   [employer] of Occupational Health - Occupational Stress Questionnaire    Feeling of Stress: Only a little  Social Connections: Socially Isolated (08/15/2024)   Social Connection and Isolation Panel    Frequency of Communication with Friends and Family: More than three times a week    Frequency of Social Gatherings with Friends and Family: Three times a week    Attends Religious Services: Never    Active Member of Clubs or Organizations: No    Attends Banker Meetings: Not on file    Marital Status:  Divorced  Depression (PHQ2-9): Low Risk (09/16/2024)   Depression (PHQ2-9)    PHQ-2 Score: 0  Recent Concern: Depression (PHQ2-9) - Medium Risk (08/27/2024)   Depression (PHQ2-9)    PHQ-2 Score: 9  Alcohol Screen: Low Risk (03/25/2024)   Alcohol Screen    Last Alcohol Screening Score (AUDIT): 0  Housing: High Risk (08/15/2024)   Epic    Unable to Pay for Housing in the Last Year: Yes    Number of Times Moved in the Last Year: 0    Homeless in the Last Year: No  Utilities: Not At Risk (04/06/2024)   AHC Utilities    Threatened with loss of utilities: No  Health Literacy: Adequate Health Literacy (03/25/2024)   B1300 Health Literacy    Frequency of need for help with medical instructions: Never    Allergies: Allergies[1]  Metabolic Disorder Labs: Lab Results  Component Value Date   HGBA1C 5.7 (A) 06/01/2024   MPG 131.24 11/06/2021   MPG 134.11 03/26/2019   No results found for: PROLACTIN Lab Results  Component Value Date   CHOL 197 06/01/2024   TRIG 248 (H) 06/01/2024   HDL 41 06/01/2024   CHOLHDL 4.8 (H) 06/01/2024   VLDL 37 11/06/2021   LDLCALC 113 (H) 06/01/2024   LDLCALC 96 12/02/2023   Lab Results  Component Value Date   TSH 1.370 06/01/2024   TSH 1.200 12/02/2023    Therapeutic Level Labs: No results found for: LITHIUM Lab Results  Component Value Date   VALPROATE 61.2 03/23/2014   No results found for: CBMZ  Current Medications: Current Outpatient Medications  Medication Sig Dispense Refill   AJOVY 225 MG/1.5ML SOAJ Inject 1 mL into the skin every 30 (thirty) days.     ALPRAZolam  (XANAX ) 1 MG tablet Take 1 tablet (1 mg total) by mouth 2 (two) times daily as needed. for anxiety 60  tablet 2   amphetamine -dextroamphetamine  (ADDERALL) 30 MG tablet Take 1 tablet by mouth 2 (two) times daily. 60 tablet 0   amphetamine -dextroamphetamine  (ADDERALL) 30 MG tablet Take 1 tablet by mouth 2 (two) times daily. 60 tablet 0   amphetamine -dextroamphetamine   (ADDERALL) 30 MG tablet Take 1 tablet by mouth 2 (two) times daily. 60 tablet 0   aspirin  EC 81 MG tablet Take 81 mg by mouth daily.     atorvastatin  (LIPITOR) 40 MG tablet TAKE 1 TABLET BY MOUTH DAILY 90 tablet 3   cetirizine  (ZYRTEC ) 10 MG tablet TAKE 1 TABLET BY MOUTH DAILY 90 tablet 3   cromolyn (OPTICROM) 4 % ophthalmic solution Place 1 drop into both eyes 2 (two) times daily.     cyclobenzaprine  (FLEXERIL ) 5 MG tablet Take 1 tablet (5 mg total) by mouth 3 (three) times daily as needed for muscle spasms. 90 tablet 2   dabigatran  (PRADAXA ) 150 MG CAPS capsule Take 1 capsule (150 mg total) by mouth 2 (two) times daily. 180 capsule 1   enoxaparin  (LOVENOX ) 100 MG/ML injection Inject 1 mL (100 mg total) into the skin every 12 (twelve) hours. 10 mL 0   famotidine  (PEPCID ) 20 MG tablet Take 20 mg by mouth daily.     FLUoxetine  (PROZAC ) 40 MG capsule Take 1 capsule (40 mg total) by mouth 2 (two) times daily. 180 capsule 2   HYDROcodone -acetaminophen  (NORCO) 10-325 MG tablet Take 1 tablet by mouth every 6 (six) hours as needed. To last 30 days from fill date 120 tablet 0   hydrOXYzine  (VISTARIL ) 25 MG capsule Take 1 capsule (25 mg total) by mouth every 8 (eight) hours as needed for itching. 30 capsule 0   levothyroxine  (SYNTHROID ) 125 MCG tablet TAKE 1 TABLET BY MOUTH DAILY BEFORE BREAKFAST 90 tablet 3   linaclotide  (LINZESS ) 290 MCG CAPS capsule TAKE ONE CAPSULE BY MOUTH DAILY 90 capsule 1   losartan  (COZAAR ) 25 MG tablet TAKE 1 TABLET BY MOUTH DAILY 30 tablet 1   mirabegron  ER (MYRBETRIQ ) 50 MG TB24 tablet Take 1 tablet (50 mg total) by mouth daily. 30 tablet 11   Multiple Vitamin (MULTIVITAMIN) capsule Take 1 capsule by mouth daily.     naloxone  (NARCAN ) nasal spray 4 mg/0.1 mL Place 1 spray into the nose as needed for up to 365 doses (for opioid-induced respiratory depresssion). In case of emergency (overdose), spray once into each nostril. If no response within 3 minutes, repeat application and  call 911. 1 each 1   nitrofurantoin  (MACRODANTIN ) 100 MG capsule Take 1 capsule (100 mg total) by mouth daily. 30 capsule 11   Omega-3 Fatty Acids (OMEGA 3 PO) Take 1 capsule by mouth daily.     ondansetron  (ZOFRAN ) 4 MG tablet Take 1 tablet (4 mg total) by mouth every 8 (eight) hours as needed for nausea or vomiting. 20 tablet 0   pantoprazole  (PROTONIX ) 40 MG tablet TAKE 1 TABLET BY MOUTH DAILY 90 tablet 3   polyethylene glycol powder (GLYCOLAX /MIRALAX ) 17 GM/SCOOP powder Take 17 g by mouth daily. Dissolve 1 capful (17g) in 4-8 ounces of liquid and take by mouth daily. 850 g 1   potassium chloride  (KLOR-CON  M) 10 MEQ tablet Take 1 tablet (10 mEq total) by mouth 3 (three) times daily. 270 tablet 3   pregabalin  (LYRICA ) 100 MG capsule TAKE ONE CAPSULE BY MOUTH FOUR TIMES DAILY 120 capsule 5   RESTASIS  0.05 % ophthalmic emulsion Place 1 drop into both eyes 2 (two) times daily.  rOPINIRole  (REQUIP ) 2 MG tablet TAKE 1 TABLET BY MOUTH TWICE DAILY 180 tablet 0   Semaglutide , 2 MG/DOSE, 8 MG/3ML SOPN Inject 2 mg as directed once a week. 3 mL 0   solifenacin  (VESICARE ) 5 MG tablet Take 1 tablet (5 mg total) by mouth daily. 30 tablet 11   traZODone  (DESYREL ) 150 MG tablet Take 1 tablet (150 mg total) by mouth at bedtime. 90 tablet 2   triamcinolone  ointment (KENALOG ) 0.5 % Apply 1 Application topically 2 (two) times daily. 30 g 0   UBRELVY  100 MG TABS Take 100 mg by mouth daily as needed (migraines). May take a second 100 mg dose 2 hours later as needed for migraines     No current facility-administered medications for this visit.     Musculoskeletal: Strength & Muscle Tone: within normal limits Gait & Station: normal Patient leans: N/A  Psychiatric Specialty Exam: Review of Systems  Musculoskeletal:  Positive for arthralgias and joint swelling.  All other systems reviewed and are negative.   There were no vitals taken for this visit.There is no height or weight on file to calculate BMI.   General Appearance: Casual and Fairly Groomed  Eye Contact:  Good  Speech:  Clear and Coherent  Volume:  Normal  Mood:  Euthymic  Affect:  Congruent  Thought Process:  Goal Directed  Orientation:  Full (Time, Place, and Person)  Thought Content: WDL   Suicidal Thoughts:  No  Homicidal Thoughts:  No  Memory:  Immediate;   Good Recent;   Good Remote;   NA  Judgement:  Good  Insight:  Good  Psychomotor Activity:  Decreased  Concentration:  Concentration: Good and Attention Span: Good  Recall:  Good  Fund of Knowledge: Good  Language: Good  Akathisia:  No  Handed:  Right  AIMS (if indicated): not done  Assets:  Communication Skills Desire for Improvement Resilience Social Support Talents/Skills  ADL's:  Intact  Cognition: WNL  Sleep:  Good   Screenings: GAD-7    Loss Adjuster, Chartered Office Visit from 08/27/2024 in [facility] Office Visit from 06/01/2024 in [facility] Office Visit from 02/25/2024 in [facility] Office Visit from 10/06/2023 in [facility]  Total GAD-7 Score 7 7 0 5   PHQ2-9    Flowsheet Row Office Visit from 09/16/2024 in [facility] Visit from 08/27/2024 in [facility] Office Visit from 06/01/2024 in [facility] Office Visit from 03/25/2024 in [facility] Office Visit from 03/16/2024 in [facility] Total Score 0 2 2 0 0  PHQ-9 Total Score -- 9 15 3  --   Flowsheet Row Admission (Discharged) from 04/05/2024 in [facility] 4E CV SURGICAL PROGRESSIVE CARE ED from 01/30/2023 in [facility] Emergency Department at [facility] Video Visit from 01/02/2023 in [facility] [place]  C-SSRS RISK CATEGORY No Risk No Risk No  Risk     Assessment and Plan: This patient is a 59 year old female with a history of bipolar disorder depressed type, generalized anxiety disorder and ADD without hyperactivity.  She continues to do well on her current regimen.  She will continue Prozac  80 mg daily for depression, Xanax  1 mg twice daily as needed for anxiety, Adderall 30 mg twice daily for ADD and trazodone  150 mg at bedtime  for sleep.  She will return to see me in 3 months  Collaboration of Care: Collaboration of Care: Primary Care Provider AEB notes to be shared with PCP at patient's request  Patient/Guardian was advised Release of Information must be obtained prior to any record release in order to collaborate their care with an outside provider. Patient/Guardian was advised if they have not already done so to contact the registration department to sign all necessary forms in order for us  to release information regarding their care.   Consent: Patient/Guardian gives verbal consent for treatment and assignment of benefits for services provided during this visit. Patient/Guardian expressed understanding and agreed to proceed.    Barnie Gull, MD 11/25/2024, 8:52 AM     [1]  Allergies Allergen Reactions   Aripiprazole Other (See Comments)    BECOMES  VIOLENT    Seroquel  [Quetiapine  Fumarate] Other (See Comments)    BECOMES VIOLENT   Chlorpromazine  Other (See Comments)    SEVERE ANXIETY    Gabapentin  Other (See Comments)    NIGHTMARES    Prednisone  Hives

## 2024-11-30 ENCOUNTER — Ambulatory Visit: Payer: MEDICAID | Admitting: Podiatry

## 2024-11-30 ENCOUNTER — Ambulatory Visit: Payer: MEDICAID

## 2024-11-30 VITALS — Ht 68.0 in | Wt 239.0 lb

## 2024-11-30 DIAGNOSIS — M79672 Pain in left foot: Secondary | ICD-10-CM | POA: Diagnosis not present

## 2024-11-30 DIAGNOSIS — M19072 Primary osteoarthritis, left ankle and foot: Secondary | ICD-10-CM | POA: Diagnosis not present

## 2024-11-30 DIAGNOSIS — M79671 Pain in right foot: Secondary | ICD-10-CM

## 2024-11-30 DIAGNOSIS — M7752 Other enthesopathy of left foot: Secondary | ICD-10-CM | POA: Diagnosis not present

## 2024-11-30 DIAGNOSIS — M7751 Other enthesopathy of right foot: Secondary | ICD-10-CM | POA: Diagnosis not present

## 2024-11-30 NOTE — Progress Notes (Signed)
"  °  Subjective:  Patient ID: Jane [name], female    DOB: 23-Jun-1966,  MRN: [id number]  Chief Complaint  Patient presents with   Foot Problem    Rm 3 Patient is here for a lump on the lateral plantar aspect of the feet. Pt states pain in the left great toe present for more than 1 month, pain is described as a throbbing pain( feels like pressure in the toe). Pt states no injury to the left toe.      Discussed the use of AI scribe software for clinical note transcription with the patient, who gave verbal consent to proceed.  History of Present Illness   She returns today for follow-up on similar pain she has been having previously now affecting both feet into the fifth metatarsal base.  Also having having some swelling pressure painful feelings in her left great toe.  Has a previous history of surgery on this.         Objective:    Physical Exam   MUSCULOSKELETAL: Pain and palpation to the plantar and lateral fifth metatarsal base, today not along the peroneal tendon, this is bilateral.  Tenderness and fluctuance on the plantar fifth metatarsal base.  Edema and prominence of the interphalangeal joint of the left hallux IPJ.      No images are attached to the encounter.    Results   Radiographs of both feet shows no fracture or stress fracture, minimal prominence of the fifth metatarsal base bilateral, left hallux is degenerative changes with spurring and previous osteotomy with wire fixation of the proximal phalanx     Assessment:     ICD-10-CM   1. Bursitis of right foot  M77.51 DG Foot Complete Right    2. Bursitis of left foot  M77.52 DG Foot Complete Left    3. 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  Assessment and Plan    Bilateral plantar foot bursitis   She exhibits pain and palpation to the plantar and lateral fifth metatarsal base, with an area of fluctuance  plantar to the fifth metatarsal base, without a history of trauma. W previous steroid injection was beneficial for alleviating this.  We discussed the risks and benefits of this including risk of infection and hyperglycemia.  Following consent, A steroid injection was administered to the area of the bursa with 10 of Kenalog , 4 mg of dexamethasone , and 1cc of 0.5% Marcaine  plain following sterile prep with alcohol to reduce inflammation and improve function. We discussed appropriate shoe gear to alleviate pain and advised follow-up as needed.     Regarding her left toe pain and swelling I reviewed her radiographs with her, she has degenerative changes and previous surgical intervention.  Discussed long-term if this continues to be a problem for her that surgical intervention with IPJ arthrodesis would be definitive treatment for this.  Follow-up with me as needed.     Return if symptoms worsen or fail to improve.   "

## 2024-12-06 ENCOUNTER — Ambulatory Visit: Payer: MEDICAID | Admitting: Family Medicine

## 2024-12-07 ENCOUNTER — Ambulatory Visit: Payer: MEDICAID | Admitting: Family Medicine

## 2024-12-14 ENCOUNTER — Encounter: Payer: MEDICAID | Admitting: Nurse Practitioner

## 2024-12-15 ENCOUNTER — Ambulatory Visit (INDEPENDENT_AMBULATORY_CARE_PROVIDER_SITE_OTHER): Payer: MEDICAID | Admitting: Family Medicine

## 2024-12-27 ENCOUNTER — Encounter: Payer: MEDICAID | Admitting: Dermatology

## 2025-01-27 ENCOUNTER — Encounter: Payer: MEDICAID | Admitting: Family Medicine

## 2025-01-31 ENCOUNTER — Other Ambulatory Visit: Payer: MEDICAID

## 2025-02-14 ENCOUNTER — Ambulatory Visit: Payer: MEDICAID | Admitting: Urology

## 2025-02-16 ENCOUNTER — Telehealth (HOSPITAL_COMMUNITY): Payer: MEDICAID | Admitting: Psychiatry

## 2025-03-02 ENCOUNTER — Other Ambulatory Visit: Payer: MEDICAID

## 2025-03-09 ENCOUNTER — Ambulatory Visit: Payer: MEDICAID | Admitting: Physician Assistant

## 2025-05-18 ENCOUNTER — Ambulatory Visit: Payer: MEDICAID | Admitting: Vascular Surgery

## 2025-05-18 ENCOUNTER — Ambulatory Visit (HOSPITAL_COMMUNITY): Payer: MEDICAID
